# Patient Record
Sex: Male | Born: 1985
Health system: Southern US, Community
[De-identification: ages and names within clinical notes are randomized; demographics above are authoritative.]

## PROBLEM LIST (undated history)

## (undated) DIAGNOSIS — R5383 Other fatigue: Secondary | ICD-10-CM

## (undated) DIAGNOSIS — I499 Cardiac arrhythmia, unspecified: Secondary | ICD-10-CM

## (undated) DIAGNOSIS — IMO0001 Reserved for inherently not codable concepts without codable children: Secondary | ICD-10-CM

## (undated) DIAGNOSIS — H019 Unspecified inflammation of eyelid: Secondary | ICD-10-CM

## (undated) DIAGNOSIS — C119 Malignant neoplasm of nasopharynx, unspecified: Secondary | ICD-10-CM

## (undated) DIAGNOSIS — E039 Hypothyroidism, unspecified: Secondary | ICD-10-CM

## (undated) DIAGNOSIS — R569 Unspecified convulsions: Secondary | ICD-10-CM

## (undated) DIAGNOSIS — C801 Malignant (primary) neoplasm, unspecified: Secondary | ICD-10-CM

## (undated) DIAGNOSIS — C7800 Secondary malignant neoplasm of unspecified lung: Secondary | ICD-10-CM

## (undated) DIAGNOSIS — IMO0002 Reserved for concepts with insufficient information to code with codable children: Secondary | ICD-10-CM

## (undated) DIAGNOSIS — G629 Polyneuropathy, unspecified: Secondary | ICD-10-CM

## (undated) HISTORY — DX: Reserved for concepts with insufficient information to code with codable children: IMO0002

## (undated) HISTORY — PX: RADICAL NECK DISSECTION: SHX2284

## (undated) HISTORY — DX: Polyneuropathy, unspecified: G62.9

## (undated) HISTORY — DX: Other fatigue: R53.83

## (undated) HISTORY — DX: Reserved for inherently not codable concepts without codable children: IMO0001

## (undated) HISTORY — DX: Unspecified inflammation of eyelid: H01.9

## (undated) HISTORY — PX: OTHER SURGICAL HISTORY: SHX169

## (undated) HISTORY — DX: Unspecified convulsions: R56.9

## (undated) HISTORY — DX: Cardiac arrhythmia, unspecified: I49.9

## (undated) HISTORY — DX: Malignant neoplasm of nasopharynx, unspecified: C11.9

## (undated) HISTORY — DX: Secondary malignant neoplasm of unspecified lung: C78.00

---

## 2012-05-03 HISTORY — PX: LUNG REMOVAL, PARTIAL: SHX233

## 2014-05-28 ENCOUNTER — Telehealth: Payer: Self-pay | Admitting: Hematology and Oncology

## 2014-05-28 NOTE — Telephone Encounter (Signed)
EMAILED [facility] EVANS @ [email or web address] .ORG AND GAVE NP APPT FOR 08/28 @ 10:30 W/DR. [place].

## 2014-06-01 ENCOUNTER — Ambulatory Visit (HOSPITAL_BASED_OUTPATIENT_CLINIC_OR_DEPARTMENT_OTHER): Payer: Medicaid Other

## 2014-06-01 ENCOUNTER — Ambulatory Visit: Payer: Medicaid Other

## 2014-06-01 ENCOUNTER — Encounter: Payer: Self-pay | Admitting: Hematology and Oncology

## 2014-06-01 ENCOUNTER — Telehealth: Payer: Self-pay | Admitting: Hematology and Oncology

## 2014-06-01 ENCOUNTER — Ambulatory Visit (HOSPITAL_BASED_OUTPATIENT_CLINIC_OR_DEPARTMENT_OTHER): Payer: Medicaid Other | Admitting: Hematology and Oncology

## 2014-06-01 VITALS — BP 118/66 | HR 70 | Temp 97.6°F | Resp 20 | Ht 66.0 in | Wt 154.0 lb

## 2014-06-01 DIAGNOSIS — G9332 Myalgic encephalomyelitis/chronic fatigue syndrome: Secondary | ICD-10-CM | POA: Insufficient documentation

## 2014-06-01 DIAGNOSIS — C7802 Secondary malignant neoplasm of left lung: Secondary | ICD-10-CM

## 2014-06-01 DIAGNOSIS — C119 Malignant neoplasm of nasopharynx, unspecified: Secondary | ICD-10-CM

## 2014-06-01 DIAGNOSIS — R5383 Other fatigue: Secondary | ICD-10-CM

## 2014-06-01 DIAGNOSIS — R5381 Other malaise: Secondary | ICD-10-CM

## 2014-06-01 DIAGNOSIS — R5382 Chronic fatigue, unspecified: Secondary | ICD-10-CM | POA: Insufficient documentation

## 2014-06-01 DIAGNOSIS — C760 Malignant neoplasm of head, face and neck: Secondary | ICD-10-CM | POA: Insufficient documentation

## 2014-06-01 DIAGNOSIS — C7800 Secondary malignant neoplasm of unspecified lung: Secondary | ICD-10-CM | POA: Insufficient documentation

## 2014-06-01 HISTORY — DX: Other fatigue: R53.83

## 2014-06-01 HISTORY — DX: Secondary malignant neoplasm of unspecified lung: C78.00

## 2014-06-01 HISTORY — DX: Malignant neoplasm of nasopharynx, unspecified: C11.9

## 2014-06-01 LAB — CBC WITH DIFFERENTIAL/PLATELET
BASO%: 0.4 % (ref 0.0–2.0)
BASOS ABS: 0 10*3/uL (ref 0.0–0.1)
EOS ABS: 0.1 10*3/uL (ref 0.0–0.5)
EOS%: 1.1 % (ref 0.0–7.0)
HEMATOCRIT: 43.3 % (ref 38.4–49.9)
HEMOGLOBIN: 14.7 g/dL (ref 13.0–17.1)
LYMPH%: 19 % (ref 14.0–49.0)
MCH: 30.3 pg (ref 27.2–33.4)
MCHC: 33.9 g/dL (ref 32.0–36.0)
MCV: 89.3 fL (ref 79.3–98.0)
MONO#: 0.6 10*3/uL (ref 0.1–0.9)
MONO%: 10.2 % (ref 0.0–14.0)
NEUT%: 69.3 % (ref 39.0–75.0)
NEUTROS ABS: 4 10*3/uL (ref 1.5–6.5)
Platelets: 232 10*3/uL (ref 140–400)
RBC: 4.85 10*6/uL (ref 4.20–5.82)
RDW: 14 % (ref 11.0–14.6)
WBC: 5.7 10*3/uL (ref 4.0–10.3)
lymph#: 1.1 10*3/uL (ref 0.9–3.3)

## 2014-06-01 LAB — TSH CHCC: TSH: 8.526 m[IU]/L — AB (ref 0.320–4.118)

## 2014-06-01 LAB — COMPREHENSIVE METABOLIC PANEL (CC13)
ALK PHOS: 57 U/L (ref 40–150)
ALT: 20 U/L (ref 0–55)
AST: 24 U/L (ref 5–34)
Albumin: 4.5 g/dL (ref 3.5–5.0)
Anion Gap: 9 mEq/L (ref 3–11)
BUN: 18.6 mg/dL (ref 7.0–26.0)
CO2: 27 mEq/L (ref 22–29)
Calcium: 9.7 mg/dL (ref 8.4–10.4)
Chloride: 104 mEq/L (ref 98–109)
Creatinine: 1 mg/dL (ref 0.7–1.3)
GLUCOSE: 93 mg/dL (ref 70–140)
POTASSIUM: 3.7 meq/L (ref 3.5–5.1)
Sodium: 140 mEq/L (ref 136–145)
Total Bilirubin: 0.61 mg/dL (ref 0.20–1.20)
Total Protein: 7.8 g/dL (ref 6.4–8.3)

## 2014-06-01 LAB — T4, FREE: FREE T4: 0.89 ng/dL (ref 0.80–1.80)

## 2014-06-01 NOTE — Telephone Encounter (Signed)
gv and printed appt sched and av sfo rpt for Sept....sent pt to lab

## 2014-06-01 NOTE — Progress Notes (Signed)
Checked in new patient. He just arrived in [place] from [place]. The [employer] referred him and the state has all info. I advised to call billing and get app for asst until medicaid is approved. Today is self. Update all.

## 2014-06-03 NOTE — Assessment & Plan Note (Signed)
Due to prior exposure to radiation treatment, I will order thyroid function tests.

## 2014-06-03 NOTE — Assessment & Plan Note (Signed)
The patient will attempt to obtain more information and outside records. I discussed with him the importance of staging. I would proceed on ordering blood work and PET CT scan to restage him and to give appropriate treatment depending on results. I will see him back next week for further assessment

## 2014-06-03 NOTE — Progress Notes (Signed)
[place] NOTE  Patient Care Team: [name], MD as Consulting Physician (Hematology and Oncology)  CHIEF COMPLAINTS/PURPOSE OF CONSULTATION:  Recurrent nasopharyngeal carcinoma with lung metastasis  HISTORY OF PRESENTING ILLNESS:  Christian Simmons 28 y.o. male is here because of prior history of recurrent nasopharyngeal carcinoma. He gave me copies of his records from [place]. The patient had immigrated from [place] to [place] and finally to [place] recently. Records were poor and the dates of his treatment were approximates only, as outlined below:  Oncology History   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 Pan-endoscopy elsewhere showed cancer from nasopharyngeal space.   01/04/2007 - 02/20/2007 Chemotherapy He received 2 cycles of cisplatin and 5FU followed by concurrent chemo with weekly cisplatin and radiation. He only received 2 doses of chemo due to severe mucositis, nausea and weight loss.   04/05/2007 - 08/04/2007 Chemotherapy He received 4 more courses of cisplatin with 5FU and had complete response   07/05/2009 Procedure Fine-needle aspirate of the right level II lymph nodes come from recurrent metastatic disease. Repeat endoscopy and CT scan show no evidence of disease elsewhere.   07/08/2009 - 12/02/2009 Chemotherapy He was given 6 cycles of carboplatin, 5-FU and docetaxel   12/03/2009 Surgery He has surgery to the residual lymph node on the right neck which showed no evidence of disease.   02/22/2012 Imaging Repeat imaging study showed large recurrent  mass. He was referred elsewhere for further treatment.   05/03/2012 Surgery He underwent left upper lobectomy.   06/03/2013 - 11/06/2013 Chemotherapy He had 6 cycles of chemotherapy when he was found to have recurrence of cancer and had received oxaliplatin and some chemotherapy pill.    According to the patient, the first initial presentation was due to headaches, neck pain, dizziness, epistaxis and left-sided hearing deficit. After receiving numerous treatment, he had peripheral neuropathy. He denies any hearing deficit, difficulties with chewing food, swallowing difficulties, painful swallowing, changes in the quality of his voice or abnormal weight loss. Patient had history of seizure disorder but have no reports seizures since then and had discontinue all treatment. He is married with the wife and son. He had graduated from college recently but is currently not working.  MEDICAL HISTORY:  Past Medical History  Diagnosis Date  . Neuropathy   . 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 Fatigue 06/01/2014    SURGICAL HISTORY: Past Surgical History  Procedure Laterality Date  . Lung removal, partial    . Radical neck dissection    . Nasal biopsy      SOCIAL HISTORY: History   Social History  . Marital Status: Married    Spouse Name: N/A    Number of Children: N/A  . Years of Education: N/A   Occupational History  . Not on file.   Social History Main Topics  . Smoking status: Never Smoker   . Smokeless tobacco: Never Used  . Alcohol Use: No  . Drug Use: No  . Sexual Activity: Not on file  Other Topics Concern  . Not on file   Social History Narrative  . No narrative on file    FAMILY HISTORY: History reviewed. No pertinent family history.  ALLERGIES:  has No Known Allergies.  MEDICATIONS:  No current outpatient prescriptions on file.   No current facility-administered medications for this visit.    REVIEW OF SYSTEMS:    Constitutional: Denies fevers, chills or abnormal night sweats Eyes: Denies blurriness of vision, double vision or watery eyes Ears, nose, mouth, throat, and face: Denies mucositis or sore throat Respiratory: Denies cough, dyspnea or wheezes Cardiovascular: Denies palpitation, chest discomfort or lower extremity swelling Gastrointestinal:  Denies nausea, heartburn or change in bowel habits Skin: Denies abnormal skin rashes Lymphatics: Denies new lymphadenopathy or easy bruising Neurological:Denies numbness, tingling or new weaknesses Behavioral/Psych: Mood is stable, no new changes  All other systems were reviewed with the patient and are negative.  PHYSICAL EXAMINATION: ECOG PERFORMANCE STATUS: 0 - Asymptomatic  Filed Vitals:   06/01/14 1106  BP: 118/66  Pulse: 70  Temp: 97.6 F (36.4 C)  Resp: 20   Filed Weights   06/01/14 1106  Weight: 154 lb (69.854 kg)    GENERAL:alert, no distress and comfortable. He looks thin but not cachectic SKIN: skin color, texture, turgor are normal, no rashes or significant lesions EYES: normal, conjunctiva are pink and non-injected, sclera clear OROPHARYNX:no exudate, no erythema and lips, buccal mucosa, and tongue normal  NECK: His heart from prior surgical scar and radiation fibrosis, thyroid normal size, non-tender, without nodularity LYMPH:  no palpable lymphadenopathy in the cervical, axillary or inguinal LUNGS: clear to auscultation and percussion with normal breathing effort. Well-healed surgical scar HEART: regular rate & rhythm and no murmurs and no lower extremity edema ABDOMEN:abdomen soft, non-tender and normal bowel sounds Musculoskeletal:no cyanosis of digits and no clubbing  PSYCH: alert & oriented x 3 with fluent speech NEURO: no focal motor/sensory deficits  LABORATORY DATA:  I have reviewed the data as listed Lab Results  Component Value Date   WBC 5.7 06/01/2014   HGB 14.7 06/01/2014   HCT 43.3 06/01/2014   MCV 89.3  06/01/2014   PLT 232 06/01/2014   Lab Results  Component Value Date   NA 140 06/01/2014   K 3.7 06/01/2014   CO2 27 06/01/2014   ASSESSMENT & PLAN:  Nasopharyngeal cancer The patient will attempt to obtain more information and outside records. I discussed with him the importance of staging. I would proceed on ordering blood work and PET CT scan to restage him and to give appropriate treatment depending on results. I will see him back next week for further assessment   Fatigue Due to prior exposure to radiation treatment, I will order thyroid function tests.  Metastasis to lung He is not symptomatic. I will observe only.     Orders Placed This Encounter  Procedures  . NM PET Image Initial (PI) Skull Base To Thigh    Standing Status: Future     Number of Occurrences:      Standing Expiration Date: 08/01/2015    Order Specific Question:  Reason for Exam (SYMPTOM  OR DIAGNOSIS REQUIRED)    Answer:  staging recurrent nasopharyngeal ca, s/p lung surgery    Order Specific Question:  Preferred imaging location?    Answer:  [facility]  . CBC with Differential    Standing Status: Future     Number of Occurrences: 1     Standing Expiration Date: 07/06/2015  . Comprehensive  metabolic panel    Standing Status: Future     Number of Occurrences: 1     Standing Expiration Date: 07/06/2015  . TSH    Standing Status: Future     Number of Occurrences: 1     Standing Expiration Date: 07/06/2015  . T4, free    Standing Status: Future     Number of Occurrences: 1     Standing Expiration Date: 07/06/2015    All questions were answered. The patient knows to call the clinic with any problems, questions or concerns. I spent 40 minutes counseling the patient face to face. The total time spent in the appointment was 60 minutes and more than 50% was on counseling.     [facility], [place], MD 06/03/2014 10:19 PM

## 2014-06-03 NOTE — Assessment & Plan Note (Signed)
He is not symptomatic. I will observe only.

## 2014-06-07 ENCOUNTER — Ambulatory Visit (HOSPITAL_COMMUNITY)
Admission: RE | Admit: 2014-06-07 | Discharge: 2014-06-07 | Disposition: A | Discharge status: Home / Self Care | Payer: Medicaid Other | Source: Ambulatory Visit | Attending: Hematology and Oncology | Admitting: Hematology and Oncology

## 2014-06-07 ENCOUNTER — Encounter (HOSPITAL_COMMUNITY): Payer: Self-pay

## 2014-06-07 DIAGNOSIS — R911 Solitary pulmonary nodule: Secondary | ICD-10-CM | POA: Diagnosis present

## 2014-06-07 DIAGNOSIS — C7802 Secondary malignant neoplasm of left lung: Secondary | ICD-10-CM

## 2014-06-07 DIAGNOSIS — C119 Malignant neoplasm of nasopharynx, unspecified: Secondary | ICD-10-CM

## 2014-06-07 LAB — GLUCOSE, CAPILLARY: GLUCOSE-CAPILLARY: 106 mg/dL — AB (ref 70–99)

## 2014-06-07 MED ORDER — FLUDEOXYGLUCOSE F - 18 (FDG) INJECTION: 7.6600 | Freq: Once | INTRAVENOUS | Status: AC | PRN

## 2014-06-08 ENCOUNTER — Ambulatory Visit (HOSPITAL_BASED_OUTPATIENT_CLINIC_OR_DEPARTMENT_OTHER): Payer: Medicaid Other | Admitting: Hematology and Oncology

## 2014-06-08 ENCOUNTER — Telehealth: Payer: Self-pay | Admitting: Hematology and Oncology

## 2014-06-08 ENCOUNTER — Encounter: Payer: Self-pay | Admitting: *Deleted

## 2014-06-08 VITALS — BP 118/60 | HR 70 | Temp 97.8°F | Resp 18 | Ht 66.0 in | Wt 156.6 lb

## 2014-06-08 DIAGNOSIS — C119 Malignant neoplasm of nasopharynx, unspecified: Secondary | ICD-10-CM

## 2014-06-08 DIAGNOSIS — E038 Other specified hypothyroidism: Secondary | ICD-10-CM

## 2014-06-08 DIAGNOSIS — R5381 Other malaise: Secondary | ICD-10-CM

## 2014-06-08 DIAGNOSIS — C7802 Secondary malignant neoplasm of left lung: Secondary | ICD-10-CM

## 2014-06-08 DIAGNOSIS — R5383 Other fatigue: Secondary | ICD-10-CM

## 2014-06-08 NOTE — Telephone Encounter (Signed)
gv and printed pt avs...Marland KitchenMarland KitchenMarland Kitchenper Santiago Glad she will contact pt with appt after she talks to [facility] about the type of appt needed.

## 2014-06-09 DIAGNOSIS — E039 Hypothyroidism, unspecified: Secondary | ICD-10-CM | POA: Insufficient documentation

## 2014-06-09 MED ORDER — LEVOTHYROXINE SODIUM 50 MCG PO TABS: 50.0000 ug | ORAL_TABLET | Freq: Every day | ORAL | Status: DC

## 2014-06-09 NOTE — Assessment & Plan Note (Signed)
Clinically, he has persistent disease, likely local recurrence of lung metastasis at prior surgical site. He also had mild activity in the right neck region. He had received numerous different chemotherapies and I suspect further treatment benefit with systemic treatment would be low. I would prefer to reserve future chemotherapy for wide spread metastatic situation. I recommend consult with a radiation oncologist for palliative radiation therapy.

## 2014-06-09 NOTE — Assessment & Plan Note (Signed)
I will prescribe low dose Synthroid and recheck thyroid function tests in 3 months.

## 2014-06-09 NOTE — Progress Notes (Signed)
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 Pan-endoscopy elsewhere showed cancer from nasopharyngeal space.   01/04/2007 - 02/20/2007 Chemotherapy He received 2 cycles of cisplatin and 5FU followed by concurrent chemo with weekly cisplatin and radiation. He only received 2 doses of chemo due to severe mucositis, nausea and weight loss.   04/05/2007 - 08/04/2007 Chemotherapy He received 4 more courses of cisplatin with 5FU and had complete response   07/05/2009 Procedure Fine-needle aspirate of the right level II lymph nodes come from recurrent metastatic disease. Repeat endoscopy and CT scan show no evidence of disease elsewhere.   07/08/2009 - 12/02/2009 Chemotherapy He was given 6 cycles of carboplatin, 5-FU and docetaxel   12/03/2009 Surgery He has surgery to the residual lymph node on the right neck which showed no evidence of disease.   02/22/2012 Imaging Repeat imaging study showed large recurrent mass. He was referred elsewhere for further treatment.   05/03/2012 Surgery He underwent left upper lobectomy.   04/29/2013 Imaging PEt scan showed lesion on right level II B and lower lung was abnormal   06/03/2013 - 02/02/2014 Chemotherapy He had 6 cycles of chemotherapy  when he was found to have recurrence of cancer and had received oxaliplatin and capecitabine   06/07/2014 Imaging PET CT scan showed persistent disease in the right neck lymph nodes and left lung    INTERVAL HISTORY: Please see below for problem oriented charting. He feels well.  REVIEW OF SYSTEMS:   Constitutional: Denies fevers, chills or abnormal weight loss Eyes: Denies blurriness of vision Ears, nose, mouth, throat, and face: Denies mucositis or sore throat Respiratory: Denies cough, dyspnea or wheezes Cardiovascular: Denies palpitation, chest discomfort or lower extremity swelling Gastrointestinal:  Denies nausea, heartburn or change in bowel habits Skin: Denies abnormal skin rashes Lymphatics: Denies new lymphadenopathy or easy bruising Neurological:Denies numbness, tingling or new weaknesses Behavioral/Psych: Mood is stable, no new changes  All other systems were reviewed with the patient and are negative.  I have reviewed the past medical history, past surgical history, social history and family history with the patient and they are unchanged from previous note.  ALLERGIES:  has No Known Allergies.  MEDICATIONS:  Current Outpatient Prescriptions  Medication Sig Dispense Refill  . levothyroxine (SYNTHROID) 50 MCG tablet Take 1 tablet (50 mcg total) by mouth daily before breakfast.  30 tablet  4   No current facility-administered medications for this visit.    PHYSICAL EXAMINATION: ECOG PERFORMANCE STATUS: 0 - Asymptomatic  Filed Vitals:   06/08/14 1045  BP: 118/60  Pulse: 70  Temp: 97.8 F (36.6 C)  Resp: 18   Filed Weights   06/08/14 1045  Weight: 156 lb 9.6 oz (71.033 kg)    GENERAL:alert, no distress and comfortable  SKIN: skin color, texture, turgor are normal, no rashes or significant lesions EYES: normal, Conjunctiva are pink and non-injected, sclera clear Musculoskeletal:no cyanosis of digits and no clubbing  NEURO: alert & oriented x 3 with fluent  speech, no focal motor/sensory deficits  LABORATORY DATA:  I have reviewed the data as listed    Component Value Date/Time   NA 140 06/01/2014 1148   K 3.7 06/01/2014 1148   CO2 27 06/01/2014 1148   GLUCOSE 93 06/01/2014 1148   BUN 18.6 06/01/2014 1148   CREATININE 1.0 06/01/2014 1148   CALCIUM 9.7 06/01/2014 1148   PROT 7.8 06/01/2014 1148   ALBUMIN 4.5 06/01/2014 1148   AST 24 06/01/2014 1148   ALT 20 06/01/2014 1148   ALKPHOS 57 06/01/2014 1148   BILITOT 0.61 06/01/2014 1148    No results found for this basename: SPEP, UPEP,  kappa and lambda light chains    Lab Results  Component Value Date   WBC 5.7 06/01/2014   NEUTROABS 4.0 06/01/2014   HGB 14.7 06/01/2014   HCT 43.3 06/01/2014   MCV 89.3 06/01/2014   PLT 232 06/01/2014      Chemistry      Component Value Date/Time   NA 140 06/01/2014 1148   K 3.7 06/01/2014 1148   CO2 27 06/01/2014 1148   BUN 18.6 06/01/2014 1148   CREATININE 1.0 06/01/2014 1148      Component Value Date/Time   CALCIUM 9.7 06/01/2014 1148   ALKPHOS 57 06/01/2014 1148   AST 24 06/01/2014 1148   ALT 20 06/01/2014 1148   BILITOT 0.61 06/01/2014 1148       RADIOGRAPHIC STUDIES: Reviewed the PET scan with him I have personally reviewed the radiological images as listed and agreed with the findings in the report.  ASSESSMENT & PLAN:  Nasopharyngeal cancer Clinically, he has persistent disease, likely local recurrence of lung metastasis at prior surgical site. He also had mild activity in the right neck region. He had received numerous different chemotherapies and I suspect further treatment benefit with systemic treatment would be low. I would prefer to reserve future chemotherapy for wide spread metastatic situation. I recommend consult with a radiation oncologist for palliative radiation therapy.  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 TSH was mildly elevated. I recommend treatment with levothyroxine. This  needs to be monitored in the future. Repeat thyroid function tests again in 3 months time. Hopefully, he would have completed his treatment by then.  Hypothyroidism I will prescribe low dose Synthroid and recheck thyroid function tests in 3 months.   Orders Placed This Encounter  Procedures  . TSH    Standing Status: Future     Number of Occurrences:      Standing Expiration Date: 07/14/2015  . CBC with Differential    Standing Status: Future     Number of Occurrences:      Standing Expiration Date: 07/14/2015  . Comprehensive metabolic panel    Standing Status: Future     Number of Occurrences:      Standing Expiration Date: 07/14/2015  . Ambulatory referral to Radiation Oncology    Referral Priority:  Routine    Referral Type:  Consultation    Referral Reason:  Specialty Services Required    Referred to Provider:  Eppie Gibson, MD    Requested Specialty:  Radiation Oncology    Number of Visits Requested:  1   All questions were answered. The patient knows to  call the clinic with any problems, questions or concerns. No barriers to learning was detected. I spent 25 minutes counseling the patient face to face. The total time spent in the appointment was 30 minutes and more than 50% was on counseling and review of test results     [facility], [place], MD 06/09/2014 3:52 PM

## 2014-06-09 NOTE — Assessment & Plan Note (Signed)
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 TSH was mildly elevated. I recommend treatment with levothyroxine. This needs to be monitored in the future. Repeat thyroid function tests again in 3 months time. Hopefully, he would have completed his treatment by then.

## 2014-06-12 ENCOUNTER — Telehealth: Payer: Self-pay | Admitting: *Deleted

## 2014-06-12 NOTE — Telephone Encounter (Signed)
Returned patient's VM left this past Sunday. In response to his inquiry, I explained the purpose of taking Synthroid newly prescribed by Dr. Alvy Bimler on 06/09/14.  He verbalized understanding.  Continuing navigation as L1 patient (new patient).  Gayleen Orem, RN, BSN, [facility] [phone]

## 2014-06-12 NOTE — Progress Notes (Signed)
Head and Neck Cancer Location of Tumor / Histology: Metastatic nasopharyngeal cancer to lymph nodes and left upper lung lobe.   SUMMARY OF ONCOLOGIC HISTORY:  Oncology History    Nasopharyngeal cancer  Primary site: Pharynx - Nasopharynx  Staging method: AJCC 7th Edition  Clinical: Stage IVC (T3, N2, M1) signed by [name], MD on 06/03/2014 10:08 PM  Summary: Stage IVC (T3, N2, M1)  He was diagnosed in [place] and received treatment in [place] and [place]. 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 Pan-endoscopy elsewhere showed cancer from nasopharyngeal space.    01/04/2007 - 02/20/2007  Chemotherapy  He received 2 cycles of cisplatin and 5FU followed by concurrent chemo with weekly cisplatin and radiation. He only received 2 doses of chemo due to severe mucositis, nausea and weight loss.    04/05/2007 - 08/04/2007  Chemotherapy  He received 4 more courses of cisplatin with 5FU and had complete response    07/05/2009  Procedure  Fine-needle aspirate of the right level II lymph nodes come from recurrent metastatic disease. Repeat endoscopy and CT scan show no evidence of disease elsewhere.    07/08/2009 - 12/02/2009  Chemotherapy  He was given 6 cycles of carboplatin, 5-FU and docetaxel    12/03/2009  Surgery  He has surgery to the residual lymph node on the right neck which showed no evidence of disease.    02/22/2012  Imaging  Repeat imaging study showed large recurrent mass. He was referred elsewhere for further treatment.    05/03/2012  Surgery  He underwent left upper lobectomy.    04/29/2013  Imaging  PEt scan showed lesion on right level II B and lower lung was abnormal    06/03/2013 - 02/02/2014  Chemotherapy  He had 6 cycles of chemotherapy when he was found to have recurrence of cancer and had received oxaliplatin and capecitabine    06/07/2014  Imaging  PET CT scan showed  persistent disease in the right neck lymph nodes and left lung       Biopsies of  Neck in 2008  Nutrition Status:  Weight changes:Well maintained. 64 kg   Swallowing status:Good  Plans, if any, for PEG tube:No  Tobacco/Marijuana/Snuff/ETOH use:No history of smoking or drinking.l  Past/Anticipated interventions by otolaryngology, if any:  Past/Anticipated interventions by medical oncology, if HXT:[id number]  6 cycles if carboplatin, 5-fu and docetaxel 07/08/1009-12/02/2009.   Referrals yet, to any of the following?  Social Work? Patient has filed for [employer]  Dentistry?   Swallowing therapy?No  Nutrition? Appetite diminished as adjusting to [employer].  Med/Onc? Seen by Dr.Gorsuch  PEG placement?NO  SAFETY ISSUES:  Prior radiation? [place] first radiation of nasopharyngeal area and second of right neck.  Pacemaker/ICD? No  Possible current pregnancy? N/A  Is the patient on methotrexate? No  Current Complaints / other details:Patient referred for palliative radiation of left upper lobe pulmonary nodule seen on PET from 06/07/14. Patient is married with 1 child.Here today with his sister.He has been in country 2 weeks.Sister has been here 10 years.

## 2014-06-12 NOTE — Telephone Encounter (Signed)
Message copied by Cathlean Cower on Tue Jun 12, 2014  8:50 AM ------      Message from: [facility], [place]      Created: Sat Jun 09, 2014  3:51 PM      Regarding: thyroid medication       I just saw his TSH is high and had prescribed thyroid supplement for him, ready at pharmacy. Please call and let him know ------

## 2014-06-12 NOTE — Telephone Encounter (Signed)
Informed pt of new thyroid medication and lab results.  He verbalized understanding and states he has already picked up the from drug store and taking once daily as directed.

## 2014-06-12 NOTE — Progress Notes (Signed)
Met with patient briefly after his appt with Dr. Alvy Bimler.   Introduced myself as his Sales executive, explained my role as a member of his Care Team, provided contact information, explained that I will be joining him during future appts.  He verbalized understanding.  Initiating navigation as L1 patient (new patient) with this encounter.  Gayleen Orem, RN, BSN, [facility] [phone]

## 2014-06-13 ENCOUNTER — Ambulatory Visit: Payer: Self-pay | Admitting: Radiation Oncology

## 2014-06-13 ENCOUNTER — Ambulatory Visit
Admission: RE | Admit: 2014-06-13 | Discharge: 2014-06-13 | Disposition: A | Discharge status: Home / Self Care | Payer: Medicaid Other | Source: Ambulatory Visit | Attending: Radiation Oncology | Admitting: Radiation Oncology

## 2014-06-13 ENCOUNTER — Encounter: Payer: Self-pay | Admitting: *Deleted

## 2014-06-13 ENCOUNTER — Telehealth: Payer: Self-pay | Admitting: Hematology and Oncology

## 2014-06-13 ENCOUNTER — Encounter: Payer: Self-pay | Admitting: Radiation Oncology

## 2014-06-13 VITALS — BP 124/67 | HR 75 | Temp 97.4°F | Wt 158.6 lb

## 2014-06-13 DIAGNOSIS — Z510 Encounter for antineoplastic radiation therapy: Secondary | ICD-10-CM | POA: Insufficient documentation

## 2014-06-13 DIAGNOSIS — Z923 Personal history of irradiation: Secondary | ICD-10-CM | POA: Insufficient documentation

## 2014-06-13 DIAGNOSIS — Z85819 Personal history of malignant neoplasm of unspecified site of lip, oral cavity, and pharynx: Secondary | ICD-10-CM | POA: Insufficient documentation

## 2014-06-13 DIAGNOSIS — K117 Disturbances of salivary secretion: Secondary | ICD-10-CM | POA: Insufficient documentation

## 2014-06-13 DIAGNOSIS — Z902 Acquired absence of lung [part of]: Secondary | ICD-10-CM | POA: Diagnosis not present

## 2014-06-13 DIAGNOSIS — R0989 Other specified symptoms and signs involving the circulatory and respiratory systems: Secondary | ICD-10-CM | POA: Diagnosis not present

## 2014-06-13 DIAGNOSIS — R222 Localized swelling, mass and lump, trunk: Secondary | ICD-10-CM | POA: Diagnosis not present

## 2014-06-13 DIAGNOSIS — Z85118 Personal history of other malignant neoplasm of bronchus and lung: Secondary | ICD-10-CM | POA: Diagnosis not present

## 2014-06-13 DIAGNOSIS — R0609 Other forms of dyspnea: Secondary | ICD-10-CM | POA: Diagnosis not present

## 2014-06-13 DIAGNOSIS — M542 Cervicalgia: Secondary | ICD-10-CM | POA: Insufficient documentation

## 2014-06-13 DIAGNOSIS — Z9221 Personal history of antineoplastic chemotherapy: Secondary | ICD-10-CM | POA: Diagnosis not present

## 2014-06-13 DIAGNOSIS — C7802 Secondary malignant neoplasm of left lung: Secondary | ICD-10-CM

## 2014-06-13 NOTE — Progress Notes (Signed)
Please see the Nurse Progress Note in the MD Initial Consult Encounter for this patient. 

## 2014-06-13 NOTE — Progress Notes (Signed)
Radiation treatment at [facility] 2008/2009 and [place] 2013.

## 2014-06-13 NOTE — Telephone Encounter (Signed)
s.w. pt and advised on Dec appt....pt ok and aware...Marland Kitchenhes comming by sched to pick up Dec appt

## 2014-06-13 NOTE — Progress Notes (Signed)
Radiation Oncology         [phone]) [phone] ________________________________  Initial outpatient Consultation - Date: 06/13/2014   Name: Christian Simmons MRN: [id number]   DOB: 06-22-1986  REFERRING PHYSICIAN: [name], MD  DIAGNOSIS: Metastatic nasopharyngeal carcinoma of the lung   STAGE: Nasopharyngeal cancer   Primary site: Pharynx - Nasopharynx   Staging method: AJCC 7th Edition   Clinical: Stage IVC (T3, N2, M1) signed by [name], MD on 06/03/2014 10:08 PM   Summary: Stage IVC (T3, N2, M1)  HISTORY OF PRESENT ILLNESS::Christian Simmons is a 28 y.o. male  Who was originally diagnosed with nasopharyngeal cancer in 2008 when he presented with a right neck mass. He was given what sounds like high dose cisplatin and radiation but did not chemo well. He did well and received adjuvant chemo alone. Sometime after that he was diagnosed with a recurrence in the original right neck mass and underwent further chemotherapy followed by surgery and repeat radiation.  He describes both radiation treatments lasting 2 months at first and then 6 weeks after that. The first radiation turned his skin black all over his mouth, face, neck and shoulders.  The second radiation per his report just involved the right and left neck. He was then found to have a left upper lobe mass and underwent left upper lobectomy in 2013 which he tolerated well. A PET scan in 2014 showed recurrent disease in the right neck again and in the left lung. Starting in late 2014 to mid May he was taking capecitabine and oxaliplatin. He had 4 cycles and a PET scan showed residual disease in the neck and lung so he underwent 2 more cycles and then immigrated to the [place]. He was accompanied by his wife today. He has a good understanding of his previous treatment. He has some dyspnea with exercise due to his previous lung resection but no shortness of breath that is worsening, cough or hemoptysis. His weight is stable. His main complaint is neck  pain on the right and he feels he can palpate disease growing back along his SCM.  He was seen by Dr. Alvy Bimler who ordered a PET scan. This showed low level activity in the tonsil and a right level 2 lymph node. He had a mass in the left lung with an SUV of 17. He would like to pursue aggressive treatment. He denies any side effects from his previous treatment. He states he had no hearing loss and any neuropathy he had in his fingers and his toes has gone away.  He lost all of his teeth due to radiation and has implants in place. He has residual xerostomia. He cannot raise his left arm above his shoulder since his neck surgery several years ago He states that the usual pattern is that his neck mass grows, he receives chemo and it shrinks.  I do not have records of any of this treatment which he emailed to Dr. Alvy Bimler and am working totally from his history and her notes.   PREVIOUS RADIATION THERAPY: Yes presumably to the nasopharynx, neck and then retreat to the right neck.   PAST MEDICAL HISTORY:  has a past medical history of Neuropathy; Nasopharyngeal cancer (06/01/2014); Metastasis to lung (06/01/2014); and Fatigue (06/01/2014).    PAST SURGICAL HISTORY: Past Surgical History  Procedure Laterality Date  . Lung removal, partial  05/03/2012    left upper lobectomy  . Radical neck dissection    . Nasal biopsy      FAMILY  HISTORY: No family history on file.  SOCIAL HISTORY:  History  Substance Use Topics  . Smoking status: Never Smoker   . Smokeless tobacco: Never Used  . Alcohol Use: No    ALLERGIES: Review of patient's allergies indicates no known allergies.  MEDICATIONS:  Current Outpatient Prescriptions  Medication Sig Dispense Refill  . levothyroxine (SYNTHROID) 50 MCG tablet Take 1 tablet (50 mcg total) by mouth daily before breakfast.  30 tablet  4   No current facility-administered medications for this encounter.    REVIEW OF SYSTEMS:  A 15 point review of systems is documented  in the electronic medical record. This was obtained by the nursing staff. However, I reviewed this with the patient to discuss relevant findings and make appropriate changes.  Pertinent items are noted in HPI.  PHYSICAL EXAM:  Filed Vitals:   06/13/14 1029  BP: 124/67  Pulse: 75  Temp: 97.4 F (36.3 C)  .158 lb 9.6 oz (71.94 kg). Pleasant male. Appears thin. Alert and oriented. Cooperative with exam. Fibrotic right neck with tenderness along the right SCM. No palpable abnormalities of the right or left neck. 5/5 streghtn bilaterally. Cannot abduct left arm above his shoulder.   LABORATORY DATA:  Lab Results  Component Value Date   WBC 5.7 06/01/2014   HGB 14.7 06/01/2014   HCT 43.3 06/01/2014   MCV 89.3 06/01/2014   PLT 232 06/01/2014   Lab Results  Component Value Date   NA 140 06/01/2014   K 3.7 06/01/2014   CO2 27 06/01/2014   Lab Results  Component Value Date   ALT 20 06/01/2014   AST 24 06/01/2014   ALKPHOS 57 06/01/2014   BILITOT 0.61 06/01/2014     RADIOGRAPHY: Nm Pet Image Initial (pi) Skull Base To Thigh  06/07/2014   CLINICAL DATA:  Initial treatment strategy for pulmonary nodule.  EXAM: NUCLEAR MEDICINE PET SKULL BASE TO THIGH  TECHNIQUE: 7.6 mCi F-18 FDG was injected intravenously. Full-ring PET imaging was performed from the skull base to thigh after the radiotracer. CT data was obtained and used for attenuation correction and anatomic localization.  FASTING BLOOD GLUCOSE:  Value: 106 mg/dl  COMPARISON:  None.  FINDINGS: NECK  Asymmetric increased uptake within the right fossa of Rosenmuller is identified. This has an SUV max equal to 4.1. There is a hypermetabolic right level IIb lymph node. This has an SUV max equal to 3.8.  CHEST  Within the left upper lobe there is a mass associated with the suture line. This measures 1.8 x 1.7 cm and has an SUV max equal to 17.8. No additional hypermetabolic pulmonary nodules or mass is identified. No hypermetabolic mediastinal or hilar lymph  nodes.  ABDOMEN/PELVIS  No abnormal hypermetabolic activity within the liver, pancreas, adrenal glands, or spleen. No hypermetabolic lymph nodes in the abdomen or pelvis.  SKELETON  No focal hypermetabolic activity to suggest skeletal metastasis.  IMPRESSION: 1. Examination is positive for hypermetabolic tumor associated with left upper lobe suture line. 2. Asymmetric increased uptake within the right fossa of Rosenmuller along with mildly hypermetabolic right level 2 B lymph node concerning for residual tumor with regional lymph node metastasis.   Electronically Signed   By: Kerby Moors M.D.   On: 06/07/2014 10:07     IMPRESSION: Recurrent/metastatic nasopharyngeal cancer  PLAN:  I think first we have to establish a diagnosis.  We have no pathology or treatment records and he certainly is not behaving in a way of a typical EBV  associated nasopharynx cancer which can be endemic in [place]. I would like to establish a diagnosis and have sent him to CT surgery for bronchoscopy and biopsy.  He does not have high activity and certainly no primary mass at this time which would yield a diagnosis in the head and neck region.   He is not a candidate for further radiation to the head and neck region as it appears, with his fibrosis and with Dr. Calton Dach clinical summary, that he has had radiation x 2 to the right neck.   If he is found to have a solitary met in the lung, we could consider surgical resection vs. Definitve radiation. As he seems to harbor metastatic disease, he likely has occult disease elsewhere and this treatment is unlikely to be curative.  I discussed this with him. We discussed that currently this lung area is the only site of active disease and I offered him definitive radiation which he accepted.  He understood that radiation is a localized treatment and would not be addressing the areas in his tonsil or neck.  I think we could treat this area with relatively few side effects.    I  will see him back in 2 weeks (after his biopsy and path are back) for simulation for the lung and followup.  I spent 60 minutes  face to face with the patient and more than 50% of that time was spent in counseling and/or coordination of care.   ------------------------------------------------  Thea Silversmith, MD

## 2014-06-13 NOTE — Progress Notes (Signed)
Met with patient and his sister during New Consult appt with Dr. Pablo Ledger.  We discussed purpose of his taking Synthroid, his historical medical tmt.  Provided support during discussion of diagnosis and prognosis, especially for sister.  Patient expressed strong interest in proceeding with RT to lung nodule.  Will continue to provide support as navigator.  Gayleen Orem, RN, BSN, [place] at [place] [phone]

## 2014-06-15 ENCOUNTER — Other Ambulatory Visit: Payer: Self-pay

## 2014-06-15 DIAGNOSIS — C119 Malignant neoplasm of nasopharynx, unspecified: Secondary | ICD-10-CM

## 2014-06-18 ENCOUNTER — Telehealth: Payer: Self-pay

## 2014-06-18 NOTE — Telephone Encounter (Signed)
Received a call from [facility] at [place] office regarding referral appointment.dr.Hendrickson will see patient Thursday 06/21/14 at 3:00 pm at [facility] clincic located at [facility] as this is the only availability.Left voice message for patient to return my call so I may give him this information.

## 2014-06-19 ENCOUNTER — Telehealth: Payer: Self-pay | Admitting: *Deleted

## 2014-06-19 NOTE — Telephone Encounter (Signed)
Called patient for an appt with Dr. Roxan Hockey at the cancer center at [facility] at 1:45. He verbalized understanding of appt time and place.  I asked if he needed an interpretor and he stated no.

## 2014-06-19 NOTE — Telephone Encounter (Signed)
Called left vm message regarding appt time and place.  06/21/14 at 1:45 at [facility]

## 2014-06-21 ENCOUNTER — Encounter: Payer: Self-pay | Admitting: *Deleted

## 2014-06-21 ENCOUNTER — Other Ambulatory Visit: Payer: Self-pay

## 2014-06-21 ENCOUNTER — Institutional Professional Consult (permissible substitution) (INDEPENDENT_AMBULATORY_CARE_PROVIDER_SITE_OTHER): Payer: Medicaid Other | Admitting: Thoracic Surgery (Cardiothoracic Vascular Surgery)

## 2014-06-21 ENCOUNTER — Encounter: Payer: Self-pay | Admitting: Thoracic Surgery (Cardiothoracic Vascular Surgery)

## 2014-06-21 VITALS — BP 111/69 | HR 73 | Temp 98.5°F | Resp 19 | Ht 66.0 in | Wt 158.0 lb

## 2014-06-21 DIAGNOSIS — Z85819 Personal history of malignant neoplasm of unspecified site of lip, oral cavity, and pharynx: Secondary | ICD-10-CM

## 2014-06-21 DIAGNOSIS — H538 Other visual disturbances: Secondary | ICD-10-CM

## 2014-06-21 DIAGNOSIS — R222 Localized swelling, mass and lump, trunk: Secondary | ICD-10-CM

## 2014-06-21 DIAGNOSIS — R918 Other nonspecific abnormal finding of lung field: Secondary | ICD-10-CM

## 2014-06-21 DIAGNOSIS — J984 Other disorders of lung: Secondary | ICD-10-CM

## 2014-06-21 NOTE — Progress Notes (Signed)
[place] Clinical Social Work  Clinical Social Work met with patient/family at [employer] appointment to offer support and assess for psychosocial needs.  Christian Simmons was accompanied by his sister today.  The patient recently moved to the [place] three weeks ago through [employer].  He lives with his spouse and 79 month old daughter.  His sister and her family live nearby.  He shared he has good family support, but is sometimes "bored" by missing all his friends and family in [place].  Mr. Linsley was recently approved for Medicaid through [place] further explained medicaid system as well as hospital/pharmacy systems.  CSW coordinated visit with financial counselor to enroll in [place].   Clinical Social Work briefly discussed Clinical Social Work role and [employer] support programs/services.  Clinical Social Work encouraged patient to call with any additional questions or concerns.   Polo Riley, MSW, LCSW, OSW-C Clinical Social Worker [facility] [phone]

## 2014-06-21 NOTE — Progress Notes (Signed)
PCP is PROVIDER NOT IN SYSTEM Referring Provider is No ref. provider found  Chief Complaint  Patient presents with  . 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 He was diagnosed in 2008 and treated with XRT and a radical neck dissection. He then had a wedge resection of a left upper lobe lung metastasis in Feb 2014.  He recently moved to [facility] and established care with Dr. Alvy Bimler. A PET/CT was done which showed a left upper lobe mass associated with the staple line. There were areas of hypermetabolic uptake in the nasopharynx and right posterior cervical nodes.  He has been having some nasal congestion primarily on the right side. He also complains of pain right posterior neck. He does not have cough, hemoptysis, or SOB. Weight stable.   Past Medical History  Diagnosis Date  . Neuropathy   . Fatigue 06/01/2014  . 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 Lung removal, partial  05/03/2012    left upper lobectomy  . Radical neck dissection    . Nasal biopsy      No family history on file.  Social History History  Substance Use Topics  . Smoking status: Never Smoker   . Smokeless tobacco: Never Used  . Alcohol Use: No    Current Outpatient Prescriptions  Medication Sig Dispense Refill  . levothyroxine (SYNTHROID) 50 MCG tablet Take 1 tablet (50 mcg total) by mouth daily before breakfast.  30 tablet  4   No current facility-administered medications for this visit.    No Known Allergies  Review of Systems  Constitutional: Positive for fatigue. Negative for fever, chills and unexpected weight change.  HENT: Positive for hearing loss, rhinorrhea and sinus pressure (right side). Negative for congestion (right side).   Eyes: Positive for visual disturbance (blurry vision x 2 months, + photophobia).  Gastrointestinal:  Positive for constipation.       Heart burn    BP 111/69  Pulse 73  Temp(Src) 98.5 F (36.9 C)  Resp 19  Ht 5\' 6"  (1.676 m)  Wt 158 lb (71.668 kg)  BMI 25.51 kg/m2  SpO2 100% Physical Exam  Vitals reviewed. Constitutional: He is oriented to person, place, and time. He appears well-developed and well-nourished. No distress.  HENT:  Head: Normocephalic and atraumatic.  Cardiovascular: Normal rate and regular rhythm.   Murmur (2/6 systolic) heard. Pulmonary/Chest: Effort normal and breath sounds normal.  Well healed scars  Abdominal: Soft. There is no tenderness.  Musculoskeletal: He exhibits no edema.  Lymphadenopathy:    He has cervical adenopathy (questionable right posterior neck along prior incision, mildly tender).  Neurological: He is alert and oriented to person, place, and time. No cranial nerve deficit.     Diagnostic Tests: NUCLEAR MEDICINE PET SKULL BASE TO THIGH  TECHNIQUE:  7.6 mCi F-18 FDG was injected intravenously. Full-ring PET imaging  was performed from the skull base to thigh after the radiotracer. CT  data was obtained and used for attenuation correction and anatomic  localization.  FASTING BLOOD GLUCOSE: Value: 106 mg/dl  COMPARISON: None.  FINDINGS:  NECK  Asymmetric increased uptake within the right fossa of Rosenmuller is  identified. This has an SUV max equal to 4.1. There is a  hypermetabolic right level IIb lymph node. This has an SUV max equal  to 3.8.  CHEST  Within the left upper lobe there is a mass associated with the  suture line. This measures 1.8 x 1.7 cm and has an SUV max equal to  17.8. No additional hypermetabolic pulmonary nodules or mass is  identified. No hypermetabolic mediastinal or hilar lymph nodes.  ABDOMEN/PELVIS  No abnormal hypermetabolic activity within the liver, pancreas,  adrenal glands, or spleen. No hypermetabolic lymph nodes in the  abdomen or pelvis.  SKELETON  No focal hypermetabolic activity to suggest  skeletal metastasis.  IMPRESSION:  1. Examination is positive for hypermetabolic tumor associated with  left upper lobe suture line.  2. Asymmetric increased uptake within the right fossa of Rosenmuller  along with mildly hypermetabolic right level 2 B lymph node  concerning for residual tumor with regional lymph node metastasis.  Electronically Signed  By: Kerby Moors M.D.  On: 06/07/2014 10:07   Impression: 28 year old with a history of stage 4 nasopharyngeal cancer with a lung metastasis. He now has a lung mass associated with the staple line which in all likelihood is a recurrence. He likely has recurrent disease in the nasopharynx and right neck as well.  He needs a biopsy to confirm the diagnosis and assist with planning treatment.  I have recommended to him that we proceed with bronchoscopy for biopsy of the left upper lobe mass. I discussed the procedure with him. He wishes to have GA as his previous bronch was uncomfortable. He understands the risks include those associated with GA, as well bleeding, pneumothorax and failure to make a diagnosis.  He agrees to proceed  I think we should get a MR of the brain as he has been having unusual blurred vision and photophobia over the past month or two.  Plan: MR Brain  Bronchoscopy on Friday 9/25

## 2014-06-21 NOTE — [facility] Oncology Navigator Note (Unsigned)
Spoke with patient at thoracic clinic today.  He was accompanied by his sister.  I reviewed TCTS history, medication, and vitals.

## 2014-06-22 ENCOUNTER — Encounter (HOSPITAL_COMMUNITY): Payer: Self-pay | Admitting: Pharmacy Technician

## 2014-06-26 ENCOUNTER — Telehealth: Payer: Self-pay | Admitting: *Deleted

## 2014-06-26 ENCOUNTER — Ambulatory Visit
Admission: RE | Admit: 2014-06-26 | Discharge: 2014-06-26 | Disposition: A | Discharge status: Home / Self Care | Payer: Medicaid Other | Source: Ambulatory Visit | Attending: Thoracic Surgery (Cardiothoracic Vascular Surgery) | Admitting: Thoracic Surgery (Cardiothoracic Vascular Surgery)

## 2014-06-26 ENCOUNTER — Other Ambulatory Visit: Payer: Self-pay | Admitting: *Deleted

## 2014-06-26 DIAGNOSIS — Z85819 Personal history of malignant neoplasm of unspecified site of lip, oral cavity, and pharynx: Secondary | ICD-10-CM

## 2014-06-26 DIAGNOSIS — R918 Other nonspecific abnormal finding of lung field: Secondary | ICD-10-CM

## 2014-06-26 DIAGNOSIS — C3410 Malignant neoplasm of upper lobe, unspecified bronchus or lung: Secondary | ICD-10-CM

## 2014-06-26 DIAGNOSIS — H538 Other visual disturbances: Secondary | ICD-10-CM

## 2014-06-26 MED ORDER — GADOBENATE DIMEGLUMINE 529 MG/ML IV SOLN: 13.0000 mL | Freq: Once | INTRAVENOUS | Status: AC | PRN

## 2014-06-26 NOTE — Telephone Encounter (Signed)
Patient called to report that he is "feeling pain in my neck".  He expressed desire to proceed with chemotherapy, "I've always responded well to chemotherapy".  He understands the risks that Drs. Alvy Bimler and Pablo Ledger discussed with him but nonetheless wants to proceed.  I indicated I would notify them of his request.  Gayleen Orem, RN, BSN, [place] at [place] [phone]

## 2014-06-27 ENCOUNTER — Ambulatory Visit: Payer: Medicaid Other

## 2014-06-27 ENCOUNTER — Inpatient Hospital Stay (HOSPITAL_COMMUNITY)
Admission: RE | Admit: 2014-06-27 | Discharge: 2014-06-27 | Disposition: A | Discharge status: Home / Self Care | Payer: Medicaid Other | Source: Ambulatory Visit

## 2014-06-27 ENCOUNTER — Ambulatory Visit: Payer: Medicaid Other | Admitting: Radiation Oncology

## 2014-06-27 NOTE — Pre-Procedure Instructions (Signed)
Christian Simmons  06/27/2014   Your procedure is scheduled on: Friday, June 29, 2014  Report to [facility] Admitting at 5:30 AM.  Call this number if you have problems the morning of surgery: [phone]   Remember:   Do not eat food or drink liquids after midnight Thursday, Sept. 24, 2015   Take these medicines the morning of surgery with A SIP OF WATER: levothyroxine (SYNTHROID)  Stop taking Aspirin, vitamins, and herbal medications. Do not take any NSAIDs ie: Ibuprofen, Advil, Naproxen or any medication containing Aspirin.  Do not wear jewelry, make-up or nail polish.  Do not wear lotions, powders, or perfumes. You may not wear deodorant.  Do not shave 48 hours prior to surgery. Men may shave face and neck.  Do not bring valuables to the hospital.  [facility] is not responsible for any belongings or valuables.               Contacts, dentures or bridgework may not be worn into surgery.  Leave suitcase in the car. After surgery it may be brought to your room.  For patients admitted to the hospital, discharge time is determined by your treatment team.               Patients discharged the day of surgery will not be allowed to drive home.  Name and phone number of your driver:   Special Instructions:  Special Instructions:Special Instructions: [facility] - Preparing for Surgery  Before surgery, you can play an important role.  Because skin is not sterile, your skin needs to be as free of germs as possible.  You can reduce the number of germs on you skin by washing with CHG (chlorahexidine gluconate) soap before surgery.  CHG is an antiseptic cleaner which kills germs and bonds with the skin to continue killing germs even after washing.  Please DO NOT use if you have an allergy to CHG or antibacterial soaps.  If your skin becomes reddened/irritated stop using the CHG and inform your nurse when you arrive at Short Stay.  Do not shave (including legs and underarms) for  at least 48 hours prior to the first CHG shower.  You may shave your face.  Please follow these instructions carefully:   1.  Shower with CHG Soap the night before surgery and the morning of Surgery.  2.  If you choose to wash your hair, wash your hair first as usual with your normal shampoo.  3.  After you shampoo, rinse your hair and body thoroughly to remove the Shampoo.  4.  Use CHG as you would any other liquid soap.  You can apply chg directly  to the skin and wash gently with scrungie or a clean washcloth.  5.  Apply the CHG Soap to your body ONLY FROM THE NECK DOWN.  Do not use on open wounds or open sores.  Avoid contact with your eyes, ears, mouth and genitals (private parts).  Wash genitals (private parts) with your normal soap.  6.  Wash thoroughly, paying special attention to the area where your surgery will be performed.  7.  Thoroughly rinse your body with warm water from the neck down.  8.  DO NOT shower/wash with your normal soap after using and rinsing off the CHG Soap.  9.  Pat yourself dry with a clean towel.            10.  Wear clean pajamas.  11.  Place clean sheets on your bed the night of your first shower and do not sleep with pets.  Day of Surgery  Do not apply any lotions/deodorants the morning of surgery.  Please wear clean clothes to the hospital/surgery center.   Please read over the following fact sheets that you were given: Pain Booklet, Coughing and Deep Breathing and Surgical Site Infection Prevention

## 2014-06-28 ENCOUNTER — Encounter (HOSPITAL_COMMUNITY): Payer: Self-pay | Admitting: *Deleted

## 2014-06-28 ENCOUNTER — Ambulatory Visit (HOSPITAL_COMMUNITY)
Admission: RE | Admit: 2014-06-28 | Discharge: 2014-06-28 | Disposition: A | Discharge status: Home / Self Care | Payer: Medicaid Other | Source: Ambulatory Visit | Attending: Thoracic Surgery (Cardiothoracic Vascular Surgery) | Admitting: Thoracic Surgery (Cardiothoracic Vascular Surgery)

## 2014-06-28 DIAGNOSIS — Z923 Personal history of irradiation: Secondary | ICD-10-CM | POA: Diagnosis not present

## 2014-06-28 DIAGNOSIS — Z01818 Encounter for other preprocedural examination: Secondary | ICD-10-CM | POA: Diagnosis not present

## 2014-06-28 DIAGNOSIS — R222 Localized swelling, mass and lump, trunk: Secondary | ICD-10-CM | POA: Diagnosis present

## 2014-06-28 DIAGNOSIS — C7800 Secondary malignant neoplasm of unspecified lung: Secondary | ICD-10-CM | POA: Diagnosis not present

## 2014-06-28 DIAGNOSIS — Z85819 Personal history of malignant neoplasm of unspecified site of lip, oral cavity, and pharynx: Secondary | ICD-10-CM | POA: Diagnosis not present

## 2014-06-29 ENCOUNTER — Ambulatory Visit (HOSPITAL_COMMUNITY): Payer: Medicaid Other

## 2014-06-29 ENCOUNTER — Encounter (HOSPITAL_COMMUNITY): Payer: Medicaid Other | Admitting: Anesthesiology

## 2014-06-29 ENCOUNTER — Ambulatory Visit (HOSPITAL_COMMUNITY): Payer: Medicaid Other | Admitting: Anesthesiology

## 2014-06-29 ENCOUNTER — Encounter (HOSPITAL_COMMUNITY)
Admission: RE | Disposition: A | Discharge status: Home / Self Care | Payer: Self-pay | Source: Ambulatory Visit | Attending: Thoracic Surgery (Cardiothoracic Vascular Surgery)

## 2014-06-29 ENCOUNTER — Encounter (HOSPITAL_COMMUNITY): Payer: Self-pay | Admitting: *Deleted

## 2014-06-29 ENCOUNTER — Ambulatory Visit (HOSPITAL_COMMUNITY)
Admission: RE | Admit: 2014-06-29 | Discharge: 2014-06-29 | Disposition: A | Discharge status: Home / Self Care | Payer: Medicaid Other | Source: Ambulatory Visit | Attending: Thoracic Surgery (Cardiothoracic Vascular Surgery) | Admitting: Thoracic Surgery (Cardiothoracic Vascular Surgery)

## 2014-06-29 DIAGNOSIS — C7800 Secondary malignant neoplasm of unspecified lung: Secondary | ICD-10-CM | POA: Insufficient documentation

## 2014-06-29 DIAGNOSIS — Z923 Personal history of irradiation: Secondary | ICD-10-CM | POA: Diagnosis not present

## 2014-06-29 DIAGNOSIS — Z85819 Personal history of malignant neoplasm of unspecified site of lip, oral cavity, and pharynx: Secondary | ICD-10-CM | POA: Insufficient documentation

## 2014-06-29 DIAGNOSIS — Z01818 Encounter for other preprocedural examination: Secondary | ICD-10-CM | POA: Insufficient documentation

## 2014-06-29 DIAGNOSIS — R222 Localized swelling, mass and lump, trunk: Secondary | ICD-10-CM | POA: Diagnosis not present

## 2014-06-29 DIAGNOSIS — J984 Other disorders of lung: Secondary | ICD-10-CM

## 2014-06-29 HISTORY — DX: Hypothyroidism, unspecified: E03.9

## 2014-06-29 HISTORY — PX: VIDEO BRONCHOSCOPY: SHX5072

## 2014-06-29 LAB — CBC
HEMATOCRIT: 40.8 % (ref 39.0–52.0)
HEMOGLOBIN: 14 g/dL (ref 13.0–17.0)
MCH: 30.2 pg (ref 26.0–34.0)
MCHC: 34.3 g/dL (ref 30.0–36.0)
MCV: 88.1 fL (ref 78.0–100.0)
Platelets: 186 10*3/uL (ref 150–400)
RBC: 4.63 MIL/uL (ref 4.22–5.81)
RDW: 14 % (ref 11.5–15.5)
WBC: 5 10*3/uL (ref 4.0–10.5)

## 2014-06-29 LAB — COMPREHENSIVE METABOLIC PANEL
ALBUMIN: 4.1 g/dL (ref 3.5–5.2)
ALK PHOS: 47 U/L (ref 39–117)
ALT: 24 U/L (ref 0–53)
ANION GAP: 9 (ref 5–15)
AST: 29 U/L (ref 0–37)
BILIRUBIN TOTAL: 0.3 mg/dL (ref 0.3–1.2)
BUN: 19 mg/dL (ref 6–23)
CHLORIDE: 104 meq/L (ref 96–112)
CO2: 28 mEq/L (ref 19–32)
Calcium: 9.4 mg/dL (ref 8.4–10.5)
Creatinine, Ser: 0.97 mg/dL (ref 0.50–1.35)
GFR calc non Af Amer: >90 mL/min (ref >90)
Glucose, Bld: 95 mg/dL (ref 70–99)
Potassium: 4.4 mEq/L (ref 3.7–5.3)
Sodium: 141 mEq/L (ref 137–147)
Total Protein: 7.1 g/dL (ref 6.0–8.3)

## 2014-06-29 LAB — PROTIME-INR
INR: 0.92 (ref 0.00–1.49)
Prothrombin Time: 12.4 seconds (ref 11.6–15.2)

## 2014-06-29 LAB — APTT: aPTT: 30 seconds (ref 24–37)

## 2014-06-29 SURGERY — BRONCHOSCOPY, VIDEO-ASSISTED
Anesthesia: General | Site: Bronchus

## 2014-06-29 MED ORDER — PROPOFOL 10 MG/ML IV BOLUS
INTRAVENOUS | Status: AC
  Filled 2014-06-29: qty 20

## 2014-06-29 MED ORDER — EPINEPHRINE HCL 1 MG/ML IJ SOLN
INTRAMUSCULAR | Status: AC
  Filled 2014-06-29: qty 1

## 2014-06-29 MED ORDER — EPINEPHRINE HCL 1 MG/ML IJ SOLN
INTRAMUSCULAR | Status: DC | PRN
  Administered 2014-06-29: 1 mg via ENDOTRACHEOPULMONARY

## 2014-06-29 MED ORDER — LIDOCAINE HCL (CARDIAC) 20 MG/ML IV SOLN
INTRAVENOUS | Status: DC | PRN
  Administered 2014-06-29: 50 mg via INTRAVENOUS

## 2014-06-29 MED ORDER — LIDOCAINE HCL (CARDIAC) 20 MG/ML IV SOLN
INTRAVENOUS | Status: AC
  Filled 2014-06-29: qty 5

## 2014-06-29 MED ORDER — LACTATED RINGERS IV SOLN
INTRAVENOUS | Status: DC | PRN
  Administered 2014-06-29: 07:00:00 via INTRAVENOUS

## 2014-06-29 MED ORDER — ROCURONIUM BROMIDE 50 MG/5ML IV SOLN
INTRAVENOUS | Status: AC
  Filled 2014-06-29: qty 1

## 2014-06-29 MED ORDER — ONDANSETRON HCL 4 MG/2ML IJ SOLN
INTRAMUSCULAR | Status: DC | PRN
  Administered 2014-06-29: 4 mg via INTRAVENOUS

## 2014-06-29 MED ORDER — NEOSTIGMINE METHYLSULFATE 10 MG/10ML IV SOLN
INTRAVENOUS | Status: AC
  Filled 2014-06-29: qty 1

## 2014-06-29 MED ORDER — SUCCINYLCHOLINE CHLORIDE 20 MG/ML IJ SOLN
INTRAMUSCULAR | Status: AC
  Filled 2014-06-29: qty 1

## 2014-06-29 MED ORDER — NEOSTIGMINE METHYLSULFATE 10 MG/10ML IV SOLN
INTRAVENOUS | Status: DC | PRN
  Administered 2014-06-29: 3 mg via INTRAVENOUS

## 2014-06-29 MED ORDER — ONDANSETRON HCL 4 MG/2ML IJ SOLN: 4.0000 mg | Freq: Once | INTRAMUSCULAR | Status: DC | PRN

## 2014-06-29 MED ORDER — LIDOCAINE HCL 4 % MT SOLN
OROMUCOSAL | Status: DC | PRN
  Administered 2014-06-29: 4 mL via TOPICAL

## 2014-06-29 MED ORDER — GLYCOPYRROLATE 0.2 MG/ML IJ SOLN
INTRAMUSCULAR | Status: DC | PRN
  Administered 2014-06-29: 0.4 mg via INTRAVENOUS

## 2014-06-29 MED ORDER — FENTANYL CITRATE 0.05 MG/ML IJ SOLN
INTRAMUSCULAR | Status: AC
  Filled 2014-06-29: qty 5

## 2014-06-29 MED ORDER — FENTANYL CITRATE 0.05 MG/ML IJ SOLN
INTRAMUSCULAR | Status: DC | PRN
  Administered 2014-06-29: 100 ug via INTRAVENOUS

## 2014-06-29 MED ORDER — GLYCOPYRROLATE 0.2 MG/ML IJ SOLN
INTRAMUSCULAR | Status: AC
  Filled 2014-06-29: qty 2

## 2014-06-29 MED ORDER — HYDROMORPHONE HCL 1 MG/ML IJ SOLN: 0.2500 mg | INTRAMUSCULAR | Status: DC | PRN

## 2014-06-29 MED ORDER — PROPOFOL 10 MG/ML IV BOLUS
INTRAVENOUS | Status: DC | PRN
  Administered 2014-06-29: 170 mg via INTRAVENOUS

## 2014-06-29 MED ORDER — MIDAZOLAM HCL 2 MG/2ML IJ SOLN
INTRAMUSCULAR | Status: AC
  Filled 2014-06-29: qty 2

## 2014-06-29 MED ORDER — EPINEPHRINE HCL 1 MG/ML IJ SOLN
INTRAMUSCULAR | Status: AC
Start: 2014-06-29 — End: 2014-06-29
  Filled 2014-06-29: qty 1

## 2014-06-29 MED ORDER — MIDAZOLAM HCL 5 MG/5ML IJ SOLN
INTRAMUSCULAR | Status: DC | PRN
  Administered 2014-06-29: 2 mg via INTRAVENOUS

## 2014-06-29 MED ORDER — ROCURONIUM BROMIDE 100 MG/10ML IV SOLN
INTRAVENOUS | Status: DC | PRN
  Administered 2014-06-29: 35 mg via INTRAVENOUS
  Administered 2014-06-29: 5 mg via INTRAVENOUS

## 2014-06-29 SURGICAL SUPPLY — 27 items
BRUSH CYTOL CELLEBRITY 1.5X140 (MISCELLANEOUS) ×3 IMPLANT
CANISTER SUCTION 2500CC (MISCELLANEOUS) ×3 IMPLANT
CONT SPEC 4OZ CLIKSEAL STRL BL (MISCELLANEOUS) ×3 IMPLANT
COTTONBALL LRG STERILE PKG (GAUZE/BANDAGES/DRESSINGS) IMPLANT
COVER TABLE BACK 60X90 (DRAPES) ×3 IMPLANT
FILTER STRAW FLUID ASPIR (MISCELLANEOUS) ×3 IMPLANT
FORCEPS BIOP RJ4 1.8 (CUTTING FORCEPS) ×3 IMPLANT
GAUZE SPONGE 4X4 12PLY STRL (GAUZE/BANDAGES/DRESSINGS) ×3 IMPLANT
GLOVE BIO SURGEON STRL SZ 6.5 (GLOVE) ×2 IMPLANT
GLOVE BIO SURGEONS STRL SZ 6.5 (GLOVE) ×1
GLOVE EUDERMIC 7 POWDERFREE (GLOVE) IMPLANT
GLOVE SURG SIGNA 7.5 PF LTX (GLOVE) ×3 IMPLANT
KIT ROOM TURNOVER OR (KITS) ×3 IMPLANT
NEEDLE 22X1 1/2 (OR ONLY) (NEEDLE) IMPLANT
NEEDLE BIOPSY TRANSBRONCH 21G (NEEDLE) IMPLANT
NEEDLE SUPERTRX PREMARK BIOPSY (NEEDLE) ×3 IMPLANT
NS IRRIG 1000ML POUR BTL (IV SOLUTION) ×3 IMPLANT
PAD ARMBOARD 7.5X6 YLW CONV (MISCELLANEOUS) ×6 IMPLANT
SOLUTION ANTI FOG 6CC (MISCELLANEOUS) ×3 IMPLANT
SYR 20ML ECCENTRIC (SYRINGE) ×3 IMPLANT
SYR 5ML LL (SYRINGE) ×3 IMPLANT
SYR 5ML LUER SLIP (SYRINGE) ×3 IMPLANT
SYR CONTROL 10ML LL (SYRINGE) IMPLANT
TOWEL OR 17X24 6PK STRL BLUE (TOWEL DISPOSABLE) ×3 IMPLANT
TRAP SPECIMEN MUCOUS 40CC (MISCELLANEOUS) ×6 IMPLANT
TUBE CONNECTING 12'X1/4 (SUCTIONS) ×1
TUBE CONNECTING 12X1/4 (SUCTIONS) ×2 IMPLANT

## 2014-06-29 NOTE — Transfer of Care (Signed)
Immediate Anesthesia Transfer of Care Note  Patient: Christian Simmons  Procedure(s) Performed: Procedure(s): VIDEO BRONCHOSCOPY  (N/A)  Patient Location: PACU  Anesthesia Type:General  Level of Consciousness: awake, alert  and oriented  Airway & Oxygen Therapy: Patient Spontanous Breathing and Patient connected to nasal cannula oxygen  Post-op Assessment: Report given to PACU RN and Post -op Vital signs reviewed and stable  Post vital signs: Reviewed and stable  Complications: No apparent anesthesia complications

## 2014-06-29 NOTE — H&P (View-Only) (Signed)
PCP is PROVIDER NOT IN SYSTEM Referring Provider is No ref. provider found  Chief Complaint  Patient presents with  . 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 He was diagnosed in 2008 and treated with XRT and a radical neck dissection. He then had a wedge resection of a left upper lobe lung metastasis in Feb 2014.  He recently moved to [facility] and established care with Dr. Alvy Bimler. A PET/CT was done which showed a left upper lobe mass associated with the staple line. There were areas of hypermetabolic uptake in the nasopharynx and right posterior cervical nodes.  He has been having some nasal congestion primarily on the right side. He also complains of pain right posterior neck. He does not have cough, hemoptysis, or SOB. Weight stable.   Past Medical History  Diagnosis Date  . Neuropathy   . Fatigue 06/01/2014  . 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 Lung removal, partial  05/03/2012    left upper lobectomy  . Radical neck dissection    . Nasal biopsy      No family history on file.  Social History History  Substance Use Topics  . Smoking status: Never Smoker   . Smokeless tobacco: Never Used  . Alcohol Use: No    Current Outpatient Prescriptions  Medication Sig Dispense Refill  . levothyroxine (SYNTHROID) 50 MCG tablet Take 1 tablet (50 mcg total) by mouth daily before breakfast.  30 tablet  4   No current facility-administered medications for this visit.    No Known Allergies  Review of Systems  Constitutional: Positive for fatigue. Negative for fever, chills and unexpected weight change.  HENT: Positive for hearing loss, rhinorrhea and sinus pressure (right side). Negative for congestion (right side).   Eyes: Positive for visual disturbance (blurry vision x 2 months, + photophobia).  Gastrointestinal:  Positive for constipation.       Heart burn    BP 111/69  Pulse 73  Temp(Src) 98.5 F (36.9 C)  Resp 19  Ht 5\' 6"  (1.676 m)  Wt 158 lb (71.668 kg)  BMI 25.51 kg/m2  SpO2 100% Physical Exam  Vitals reviewed. Constitutional: He is oriented to person, place, and time. He appears well-developed and well-nourished. No distress.  HENT:  Head: Normocephalic and atraumatic.  Cardiovascular: Normal rate and regular rhythm.   Murmur (2/6 systolic) heard. Pulmonary/Chest: Effort normal and breath sounds normal.  Well healed scars  Abdominal: Soft. There is no tenderness.  Musculoskeletal: He exhibits no edema.  Lymphadenopathy:    He has cervical adenopathy (questionable right posterior neck along prior incision, mildly tender).  Neurological: He is alert and oriented to person, place, and time. No cranial nerve deficit.     Diagnostic Tests: NUCLEAR MEDICINE PET SKULL BASE TO THIGH  TECHNIQUE:  7.6 mCi F-18 FDG was injected intravenously. Full-ring PET imaging  was performed from the skull base to thigh after the radiotracer. CT  data was obtained and used for attenuation correction and anatomic  localization.  FASTING BLOOD GLUCOSE: Value: 106 mg/dl  COMPARISON: None.  FINDINGS:  NECK  Asymmetric increased uptake within the right fossa of Rosenmuller is  identified. This has an SUV max equal to 4.1. There is a  hypermetabolic right level IIb lymph node. This has an SUV max equal  to 3.8.  CHEST  Within the left upper lobe there is a mass associated with the  suture line. This measures 1.8 x 1.7 cm and has an SUV max equal to  17.8. No additional hypermetabolic pulmonary nodules or mass is  identified. No hypermetabolic mediastinal or hilar lymph nodes.  ABDOMEN/PELVIS  No abnormal hypermetabolic activity within the liver, pancreas,  adrenal glands, or spleen. No hypermetabolic lymph nodes in the  abdomen or pelvis.  SKELETON  No focal hypermetabolic activity to suggest  skeletal metastasis.  IMPRESSION:  1. Examination is positive for hypermetabolic tumor associated with  left upper lobe suture line.  2. Asymmetric increased uptake within the right fossa of Rosenmuller  along with mildly hypermetabolic right level 2 B lymph node  concerning for residual tumor with regional lymph node metastasis.  Electronically Signed  By: Kerby Moors M.D.  On: 06/07/2014 10:07   Impression: 28 year old with a history of stage 4 nasopharyngeal cancer with a lung metastasis. He now has a lung mass associated with the staple line which in all likelihood is a recurrence. He likely has recurrent disease in the nasopharynx and right neck as well.  He needs a biopsy to confirm the diagnosis and assist with planning treatment.  I have recommended to him that we proceed with bronchoscopy for biopsy of the left upper lobe mass. I discussed the procedure with him. He wishes to have GA as his previous bronch was uncomfortable. He understands the risks include those associated with GA, as well bleeding, pneumothorax and failure to make a diagnosis.  He agrees to proceed  I think we should get a MR of the brain as he has been having unusual blurred vision and photophobia over the past month or two.  Plan: MR Brain  Bronchoscopy on Friday 9/25

## 2014-06-29 NOTE — Interval H&P Note (Signed)
History and Physical Interval Note:  06/29/2014 7:16 AM  Christian Simmons  has presented today for surgery, with the diagnosis of Left upper lobe mass  The various methods of treatment have been discussed with the patient and family. After consideration of risks, benefits and other options for treatment, the patient has consented to  Procedure(s): VIDEO BRONCHOSCOPY, Bronchoscopy (N/A) as a surgical intervention .  The patient's history has been reviewed, patient examined, no change in status, stable for surgery.  I have reviewed the patient's chart and labs.  Questions were answered to the patient's satisfaction.     Kamerin Axford C

## 2014-06-29 NOTE — Brief Op Note (Signed)
06/29/2014  9:24 AM  PATIENT:  Christian Simmons  28 y.o. male  PRE-OPERATIVE DIAGNOSIS:   Left upper lobe Lung mass  POST-OPERATIVE DIAGNOSIS:   Carcinoma  PROCEDURE:  Procedure(s): VIDEO BRONCHOSCOPY  (N/A)  SURGEON:  Surgeon(s) and Role:    * Melrose Nakayama, MD - Primary  ANESTHESIA:   general  EBL:  Total I/O In: 900 [I.V.:900] Out: -   BLOOD ADMINISTERED:none  DRAINS: none   LOCAL MEDICATIONS USED:  NONE  SPECIMEN:  Source of Specimen:  left upper lobe mass  DISPOSITION OF SPECIMEN:  PATHOLOGY   PLAN OF CARE: Discharge to home after PACU  PATIENT DISPOSITION:  PACU - hemodynamically stable.   Delay start of Pharmacological VTE agent (>24hrs) due to surgical blood loss or risk of bleeding: not applicable  BRUSHINGS + CARCINOMA

## 2014-06-29 NOTE — Anesthesia Preprocedure Evaluation (Signed)
Anesthesia Evaluation  Patient identified by MRN, date of birth, ID band Patient awake    Reviewed: Allergy & Precautions, H&P , NPO status , Patient's Chart, lab work & pertinent test results  Airway Mallampati: II TM Distance: >3 FB Neck ROM: Full    Dental  (+) Teeth Intact, Dental Advisory Given   Pulmonary  breath sounds clear to auscultation        Cardiovascular Rhythm:Regular Rate:Normal     Neuro/Psych    GI/Hepatic   Endo/Other    Renal/GU      Musculoskeletal   Abdominal   Peds  Hematology   Anesthesia Other Findings   Reproductive/Obstetrics                           Anesthesia Physical Anesthesia Plan  ASA: III  Anesthesia Plan: General   Post-op Pain Management:    Induction: Intravenous  Airway Management Planned: Oral ETT  Additional Equipment:   Intra-op Plan:   Post-operative Plan: Extubation in OR  Informed Consent: I have reviewed the patients History and Physical, chart, labs and discussed the procedure including the risks, benefits and alternatives for the proposed anesthesia with the patient or authorized representative who has indicated his/her understanding and acceptance.   Dental advisory given  Plan Discussed with: CRNA and Anesthesiologist  Anesthesia Plan Comments:         Anesthesia Quick Evaluation

## 2014-06-29 NOTE — Discharge Instructions (Addendum)
Do not drive or engage in heavy physical activity for 24 hours  You will likely cough up small amounts of blood over the next few days  Call [phone] if you develop fever > 101, shortness of breath, chest pain or cough up large amounts of blood (> 1/2 cup)  [facility] will be in touch with you to schedule a follow up appointment    What to eat:  For your first meals, you should eat lightly; only small meals initially.  If you do not have nausea, you may eat larger meals.  Avoid spicy, greasy and heavy food.    General Anesthesia, Adult, Care After  Refer to this sheet in the next few weeks. These instructions provide you with information on caring for yourself after your procedure. Your health care provider may also give you more specific instructions. Your treatment has been planned according to current medical practices, but problems sometimes occur. Call your health care provider if you have any problems or questions after your procedure.  WHAT TO EXPECT AFTER THE PROCEDURE  After the procedure, it is typical to experience:  Sleepiness.  Nausea and vomiting. HOME CARE INSTRUCTIONS  For the first 24 hours after general anesthesia:  Have a responsible person with you.  Do not drive a car. If you are alone, do not take public transportation.  Do not drink alcohol.  Do not take medicine that has not been prescribed by your health care provider.  Do not sign important papers or make important decisions.  You may resume a normal diet and activities as directed by your health care provider.  Change bandages (dressings) as directed.  If you have questions or problems that seem related to general anesthesia, call the hospital and ask for the anesthetist or anesthesiologist on call. SEEK MEDICAL CARE IF:  You have nausea and vomiting that continue the day after anesthesia.  You develop a rash. SEEK IMMEDIATE MEDICAL CARE IF:  You have difficulty breathing.  You have chest pain.    You have any allergic problems. Document Released: 12/28/2000 Document Revised: 05/24/2013 Document Reviewed: 04/06/2013  [facility] Patient Information 2014 [place], [place].

## 2014-06-29 NOTE — Anesthesia Postprocedure Evaluation (Signed)
  Anesthesia Post-op Note  Patient: Christian Simmons  Procedure(s) Performed: Procedure(s): VIDEO BRONCHOSCOPY  (N/A)  Patient Location: PACU  Anesthesia Type:General  Level of Consciousness: awake, alert  and oriented  Airway and Oxygen Therapy: Patient Spontanous Breathing  Post-op Pain: mild  Post-op Assessment: Post-op Vital signs reviewed, Patient's Cardiovascular Status Stable, Respiratory Function Stable, Patent Airway and Pain level controlled  Post-op Vital Signs: stable  Last Vitals:  Filed Vitals:   06/29/14 1103  BP: 117/67  Pulse: 87  Temp:   Resp: 15    Complications: No apparent anesthesia complications

## 2014-07-02 ENCOUNTER — Other Ambulatory Visit: Payer: Self-pay | Admitting: *Deleted

## 2014-07-02 ENCOUNTER — Encounter: Payer: Self-pay | Admitting: *Deleted

## 2014-07-02 DIAGNOSIS — F419 Anxiety disorder, unspecified: Secondary | ICD-10-CM

## 2014-07-02 LAB — CULTURE, RESPIRATORY W GRAM STAIN

## 2014-07-02 LAB — CULTURE, RESPIRATORY

## 2014-07-02 MED ORDER — ALPRAZOLAM 0.5 MG PO TABS: 0.5000 mg | ORAL_TABLET | Freq: Once | ORAL | Status: DC | PRN

## 2014-07-02 NOTE — Progress Notes (Signed)
Patient ID: Christian Simmons, male   DOB: 23-Feb-1986, 29 y.o.   MRN: [id number] Mr. Alcala was scheduled for MRA BRAIN last week but was unable to complete the study due to severe anxiety.  The study has been rescheduled to a more open and quieter machine.  Dr. Roxan Hockey has also prescribed Xanax .5mg  prior to the procedure.  Mr. Witter is aware that a script is at the front desk for pick up today.

## 2014-07-03 ENCOUNTER — Encounter (HOSPITAL_COMMUNITY): Payer: Self-pay | Admitting: Thoracic Surgery (Cardiothoracic Vascular Surgery)

## 2014-07-03 ENCOUNTER — Inpatient Hospital Stay: Admission: RE | Admit: 2014-07-03 | Payer: Medicaid Other | Source: Ambulatory Visit

## 2014-07-03 NOTE — Op Note (Signed)
Christian Simmons, Christian Simmons            ACCOUNT NO.:  [id number]  MEDICAL RECORD NO.:  [id number]  LOCATION:  [facility]                         FACILITY:  [facility]  PHYSICIAN:  Revonda Standard. Roxan Hockey, M.D.DATE OF BIRTH:  03-28-1986  DATE OF PROCEDURE:  06/29/2014 DATE OF DISCHARGE:  06/29/2014                              OPERATIVE REPORT   PREOPERATIVE DIAGNOSIS:  Left upper lobe lung mass.  POSTOPERATIVE DIAGNOSIS:  Left upper lobe lung mass, carcinoma.  PROCEDURE:  Video bronchoscopy with brushings and biopsies.  SURGEON:  Revonda Standard. Roxan Hockey, M.D.  ANESTHESIA:  General.  FINDINGS:  Biopsies and brushings obtained from left upper lobe mass, biopsies positive for carcinoma.  CLINICAL NOTE:  Christian Simmons is a 28 year old gentleman originally from [place], who had a history of nasopharyngeal carcinoma with a lung metastasis, which had been previously resected in [place] with a wedge resection.  He now presents for follow up and a PET-CT scan shows a left upper lobe mass, which is hypermetabolic as well as hypermetabolic areas in the cervical lymph node chain and the nasopharynx and he was advised to undergo bronchoscopy for biopsy for diagnostic purposes.  The indications risks, benefits, and alternatives were discussed in detail with the patient.  He understood and accepted the risks and agreed to proceed.  OPERATIVE NOTE:  Christian Simmons was brought to the operating room on June 29, 2014.  He had induction of general anesthesia. Flexible fiberoptic bronchoscopy was performed via the endotracheal tube.  There was normal endobronchial anatomy.  There were no endobronchial lesions visible to the level of the subsegmental bronchi.  Using fluoroscopy as a guide, biopsies and brushings were obtained of the left upper lobe mass.  Brushings were performed 1st and these were sent for quick prep. While awaiting those results, biopsies were obtained from the area. There was some bleeding with the  biopsies, which was controlled with dilute epinephrine applied endobronchially.  The quick prep subsequently returned showing carcinoma.  The biopsies were sent for permanent pathology.  Final inspection was made and there was no ongoing bleeding. The bronchoscope was withdrawn.  The patient was extubated in the operating room and taken to the postanesthetic care unit in good condition.     Revonda Standard Roxan Hockey, M.D.    SCH/MEDQ  D:  07/02/2014  T:  07/03/2014  Job:  [id number]

## 2014-07-04 ENCOUNTER — Ambulatory Visit: Payer: Self-pay | Admitting: Radiation Oncology

## 2014-07-05 ENCOUNTER — Ambulatory Visit
Admission: RE | Admit: 2014-07-05 | Discharge: 2014-07-05 | Disposition: A | Discharge status: Home / Self Care | Payer: Medicaid Other | Source: Ambulatory Visit | Attending: Radiation Oncology | Admitting: Radiation Oncology

## 2014-07-05 ENCOUNTER — Telehealth: Payer: Self-pay

## 2014-07-05 VITALS — BP 121/68 | HR 73 | Temp 98.0°F | Wt 162.4 lb

## 2014-07-05 DIAGNOSIS — F40240 Claustrophobia: Secondary | ICD-10-CM | POA: Insufficient documentation

## 2014-07-05 DIAGNOSIS — C119 Malignant neoplasm of nasopharynx, unspecified: Secondary | ICD-10-CM | POA: Diagnosis not present

## 2014-07-05 DIAGNOSIS — Z510 Encounter for antineoplastic radiation therapy: Secondary | ICD-10-CM | POA: Insufficient documentation

## 2014-07-05 DIAGNOSIS — C7802 Secondary malignant neoplasm of left lung: Secondary | ICD-10-CM

## 2014-07-05 MED ORDER — LORAZEPAM 1 MG PO TABS: ORAL_TABLET | ORAL | Status: DC

## 2014-07-05 NOTE — Progress Notes (Signed)
   Department of Radiation Oncology  Phone:  [phone] Fax:        [phone]   Name: Christian Simmons MRN: [id number]  DOB: [birth year]  Date: 07/05/2014  Follow Up Visit Note  Diagnosis:    ICD-9-CM ICD-10-CM  1. Nasopharyngeal cancer 147.9 C11.9   Interval History: Christian Simmons presents today for routine followup.  Christian Simmons had his bronch and biopsy on 9/25. This showed metastatic cancer. Christian Simmons recovered well from that. Dr. Roxan Hockey ordered an MRI of the brain. Christian Simmons is anxious about doing that as Christian Simmons has significant claustrophobia. Christian Simmons continues to complain of pain along his right neck. Christian Simmons describes this as "jumping" of the muscle. This has been worse with exercise. Christian Simmons is still interested in pursuing radiation to his left upper lobe cancer  Physical Exam:  Filed Vitals:   07/05/14 1552  BP: 121/68  Pulse: 73  Temp: 98 F (36.7 C)  Weight: 162 lb 6.4 oz (73.664 kg)  SpO2: 100%   tight neck with scarring. Alert and oriented.   IMPRESSION: Christian Simmons is a 28 y.o. male with metastatic nasopharyngeal carcinoma to the lung.   PLAN:  I spoke with Marcy Siren again today regarding his diagnosis. We discussed palliative radiation to the left upper lobe. We discussed that his would provide local control to this one lesion but not likely extend his survival. I gave him Ativan to take for his MRI and will refer to physical therapy for his nick symptoms.  I have also encouraged him to keep his appt for the MRI.  I have scheduled him for simulation next week on Tuesday with plans to start the following week. Hopefully we will be able to approach this from a stereotactic standpoint,  I gave him information on our support groups and encouraged him to consider living with cancer.     Thea Silversmith, MD

## 2014-07-05 NOTE — Telephone Encounter (Signed)
Xanax script did not go through electronically from [facility] so it was called in from Dr.Wentworth's office by Nicholos Johns RN as previously written.Patient informed to pick up and given direction on how to take.

## 2014-07-05 NOTE — Addendum Note (Signed)
Encounter addended by: Arlyss Repress, RN on: 07/05/2014  5:03 PM<BR>     Documentation filed: Charges VN

## 2014-07-05 NOTE — Progress Notes (Signed)
Patient here as follow up new consult to discuss lung biopsy 06/29/14 reveals metastatic carcinoma.Denies pain.Weight up 4 lbs.[facility]Feeling  "blah".Scheduled for mri brain on 07/11/14.

## 2014-07-06 ENCOUNTER — Telehealth: Payer: Self-pay | Admitting: *Deleted

## 2014-07-06 NOTE — Telephone Encounter (Signed)
CALLED PATIENT TO INFORM THIS PATIENT OF HIS PT APPT. FOR 07-16-14- ARRIVAL TIME - 8:15 AM @ [facility], SPOKE WITH PATIENT AND HE IS AWARE OF THIS APPT.

## 2014-07-10 ENCOUNTER — Ambulatory Visit
Admission: RE | Admit: 2014-07-10 | Discharge: 2014-07-10 | Disposition: A | Discharge status: Home / Self Care | Payer: Medicaid Other | Source: Ambulatory Visit | Attending: Radiation Oncology | Admitting: Radiation Oncology

## 2014-07-10 ENCOUNTER — Encounter: Payer: Self-pay | Admitting: *Deleted

## 2014-07-10 DIAGNOSIS — Z510 Encounter for antineoplastic radiation therapy: Secondary | ICD-10-CM | POA: Diagnosis not present

## 2014-07-10 DIAGNOSIS — C7802 Secondary malignant neoplasm of left lung: Secondary | ICD-10-CM

## 2014-07-10 NOTE — Progress Notes (Signed)
[facility] Radiation Oncology Simulation and Treatment Planning Note   Name: Christian Simmons MRN: [id number]  Date: 07/10/2014  DOB: [birth year]  Status: outpatient    DIAGNOSIS: There were no encounter diagnoses.    SIDE: left   CONSENT VERIFIED: yes   SET UP AND IMMOBILIZATION: Patient is setup supine with arms in a wing board.   NARRATIVE: The patient was brought to the [facility].  Identity was confirmed.  All relevant records and images related to the planned course of therapy were reviewed.  Then, the patient was positioned in a stable reproducible clinical set-up for radiation therapy.  CT images were obtained.  Skin markings were placed.  The CT images were loaded into the planning software where the target and avoidance structures were contoured.  The radiation prescription was entered and confirmed.   TREATMENT PLANNING NOTE:  Treatment planning then occurred. I have requested 3D simulation with [facility] of the spinal cord, total lungs and gross tumor volume. I have also requested mlcs and an isodose plan.   Special treatment procedure will be performed as Lytle Michaels has received previous treatment to this area.   I have ordered a consult with the dietician for monitoring.  I will also be verifying that weekly lab values are appropriate.

## 2014-07-10 NOTE — Progress Notes (Signed)
To provide support and encouragement, care continuity and to assess for needs, met with patient and his sister during his planning Sim: 1. Provided a tour of RT area, specifically [place] [facility], explained procedure for tmt arrival and preparation. 2. Provided him an Epic calendar of currently scheduled appts to complement [facility] version. 3. Discussed his previously expressed interest in chemotherapy.  He indicated he no longer is interested, he believes upcoming appt with PT will help resolve the pain issues he is having in his neck. 4. He expressed understanding he can contact me with any questions/concerns. Continuing navigation as L1 patient (new patient).  Gayleen Orem, RN, BSN, [facility] [phone]

## 2014-07-11 ENCOUNTER — Other Ambulatory Visit: Payer: Medicaid Other

## 2014-07-16 ENCOUNTER — Ambulatory Visit: Payer: Medicaid Other | Attending: Radiation Oncology | Admitting: Physical Therapy

## 2014-07-16 DIAGNOSIS — I890 Lymphedema, not elsewhere classified: Secondary | ICD-10-CM | POA: Diagnosis not present

## 2014-07-16 DIAGNOSIS — Z5189 Encounter for other specified aftercare: Secondary | ICD-10-CM | POA: Diagnosis not present

## 2014-07-16 DIAGNOSIS — E039 Hypothyroidism, unspecified: Secondary | ICD-10-CM | POA: Diagnosis not present

## 2014-07-16 DIAGNOSIS — M436 Torticollis: Secondary | ICD-10-CM | POA: Insufficient documentation

## 2014-07-16 DIAGNOSIS — C119 Malignant neoplasm of nasopharynx, unspecified: Secondary | ICD-10-CM | POA: Insufficient documentation

## 2014-07-17 ENCOUNTER — Encounter: Payer: Self-pay | Admitting: Radiation Oncology

## 2014-07-17 ENCOUNTER — Ambulatory Visit
Admission: RE | Admit: 2014-07-17 | Discharge: 2014-07-17 | Disposition: A | Discharge status: Home / Self Care | Payer: Medicaid Other | Source: Ambulatory Visit | Attending: Radiation Oncology | Admitting: Radiation Oncology

## 2014-07-17 DIAGNOSIS — C119 Malignant neoplasm of nasopharynx, unspecified: Secondary | ICD-10-CM

## 2014-07-17 DIAGNOSIS — Z510 Encounter for antineoplastic radiation therapy: Secondary | ICD-10-CM | POA: Diagnosis not present

## 2014-07-17 NOTE — Progress Notes (Signed)
  Radiation Oncology         [phone] ________________________________  Name: Christian Simmons MRN: [id number]  Date: 07/17/2014  DOB: [birth year]  Simulation Verification Note  Status: outpatient  NARRATIVE: The patient was brought to the treatment unit and placed in the planned treatment position. The clinical setup was verified. Then port films were obtained and uploaded to the radiation oncology medical record software.  The treatment beams were carefully compared against the planned radiation fields. The position location and shape of the radiation fields was reviewed. The targeted volume of tissue appears appropriately covered by the radiation beams. Organs at risk appear to be excluded as planned.  Based on my personal review, I approved the simulation verification. The patient's treatment will proceed as planned.  ------------------------------------------------  Thea Silversmith, MD

## 2014-07-18 ENCOUNTER — Inpatient Hospital Stay: Admission: RE | Admit: 2014-07-18 | Payer: Medicaid Other | Source: Ambulatory Visit

## 2014-07-18 ENCOUNTER — Ambulatory Visit
Admission: RE | Admit: 2014-07-18 | Discharge: 2014-07-18 | Disposition: A | Discharge status: Home / Self Care | Payer: Medicaid Other | Source: Ambulatory Visit | Attending: Radiation Oncology | Admitting: Radiation Oncology

## 2014-07-18 DIAGNOSIS — Z510 Encounter for antineoplastic radiation therapy: Secondary | ICD-10-CM | POA: Diagnosis not present

## 2014-07-19 ENCOUNTER — Ambulatory Visit
Admission: RE | Admit: 2014-07-19 | Discharge: 2014-07-19 | Disposition: A | Discharge status: Home / Self Care | Payer: Medicaid Other | Source: Ambulatory Visit | Attending: Radiation Oncology | Admitting: Radiation Oncology

## 2014-07-19 DIAGNOSIS — Z510 Encounter for antineoplastic radiation therapy: Secondary | ICD-10-CM | POA: Diagnosis not present

## 2014-07-20 ENCOUNTER — Ambulatory Visit
Admission: RE | Admit: 2014-07-20 | Discharge: 2014-07-20 | Disposition: A | Discharge status: Home / Self Care | Payer: Medicaid Other | Source: Ambulatory Visit | Attending: Radiation Oncology | Admitting: Radiation Oncology

## 2014-07-20 DIAGNOSIS — C7802 Secondary malignant neoplasm of left lung: Secondary | ICD-10-CM | POA: Insufficient documentation

## 2014-07-20 DIAGNOSIS — Z510 Encounter for antineoplastic radiation therapy: Secondary | ICD-10-CM | POA: Insufficient documentation

## 2014-07-20 MED ORDER — RADIAPLEXRX EX GEL
Freq: Once | CUTANEOUS | Status: AC
  Administered 2014-07-20: 12:00:00 via TOPICAL

## 2014-07-20 NOTE — Progress Notes (Signed)
Routine of clinic reviewed.Discuseed potential side effects to include fatigue, skin discoloration, shortness of breath or cough.Denies pain.Push po fluids for hydration up to 6 to 8  Oz/day.Given radiaplex apply to affected area if needed twice daily.Given my card.Patient declined Radiation Therapy and You Booklet.Lnows if he has any questions to inform therapist so he may be seen while in office.

## 2014-07-20 NOTE — Anesthesia Postprocedure Evaluation (Signed)
  Anesthesia Post-op Note  Patient: Christian Simmons  Procedure(s) Performed: Procedure(s): VIDEO BRONCHOSCOPY  (N/A)  Patient Location: PACU  Anesthesia Type:General  Level of Consciousness: awake, alert  and oriented  Airway and Oxygen Therapy: Patient Spontanous Breathing and Patient connected to nasal cannula oxygen  Post-op Pain: mild  Post-op Assessment: Post-op Vital signs reviewed, Patient's Cardiovascular Status Stable, Respiratory Function Stable, Patent Airway and Pain level controlled  Post-op Vital Signs: stable  Last Vitals:  Filed Vitals:   06/29/14 1103  BP: 117/67  Pulse: 87  Temp:   Resp: 15    Complications: No apparent anesthesia complications

## 2014-07-23 ENCOUNTER — Ambulatory Visit: Payer: Medicaid Other | Admitting: Physical Therapy

## 2014-07-23 ENCOUNTER — Ambulatory Visit
Admission: RE | Admit: 2014-07-23 | Discharge: 2014-07-23 | Disposition: A | Discharge status: Home / Self Care | Payer: Medicaid Other | Source: Ambulatory Visit | Attending: Radiation Oncology | Admitting: Radiation Oncology

## 2014-07-23 DIAGNOSIS — Z510 Encounter for antineoplastic radiation therapy: Secondary | ICD-10-CM | POA: Diagnosis not present

## 2014-07-24 ENCOUNTER — Inpatient Hospital Stay
Admission: RE | Admit: 2014-07-24 | Discharge: 2014-07-24 | Disposition: A | Discharge status: Home / Self Care | Payer: Self-pay | Source: Ambulatory Visit | Attending: Radiation Oncology | Admitting: Radiation Oncology

## 2014-07-24 ENCOUNTER — Ambulatory Visit
Admission: RE | Admit: 2014-07-24 | Discharge: 2014-07-24 | Disposition: A | Discharge status: Home / Self Care | Payer: Medicaid Other | Source: Ambulatory Visit | Attending: Radiation Oncology | Admitting: Radiation Oncology

## 2014-07-24 VITALS — BP 118/66 | HR 99 | Temp 98.6°F | Wt 162.9 lb

## 2014-07-24 DIAGNOSIS — C7800 Secondary malignant neoplasm of unspecified lung: Secondary | ICD-10-CM

## 2014-07-24 DIAGNOSIS — Z510 Encounter for antineoplastic radiation therapy: Secondary | ICD-10-CM | POA: Diagnosis not present

## 2014-07-24 NOTE — Progress Notes (Signed)
Weekly assessment of radiation to left lung.Completed 5 of 10 treatments.Productive cough of clear secretions.Getting over cold symptoms and fever from this weekend.Instructed to go to [facility] to get refill on synthroid.

## 2014-07-24 NOTE — Progress Notes (Signed)
  Radiation Oncology         [phone] ________________________________  Name: Leshawn Straka MRN: [id number]  Date: 07/24/2014  DOB: 01-Dec-1985  Weekly Radiation Therapy Management  Nasopharyngeal cancer   Primary site: Pharynx - Nasopharynx   Staging method: AJCC 7th Edition   Clinical: Stage IVC (T3, N2, M1)   Summary: Stage IVC (T3, N2, M1)   Current Dose: 20 Gy     Planned Dose:  40 Gy projected at the left lung area  Narrative . . . . . . . . The patient presents for routine under treatment assessment.                                   The patient is without complaint except for some dizziness with standing. Patient was noted to have orthostatic blood pressure changes and was recommended he force fluids                                 Set-up films were reviewed.                                 The chart was checked. Physical Findings. . .  weight is 162 lb 14.4 oz (73.891 kg). His temperature is 98.6 F (37 C). His blood pressure is 118/66 and his pulse is 99. His oxygen saturation is 100%. . Weight essentially stable.  The lungs are clear. The heart has a regular rhythm and rate. Impression . . . . . . . The patient is tolerating radiation. Plan . . . . . . . . . . . . Continue treatment as planned.  ________________________________   Blair Promise, PhD, MD

## 2014-07-25 ENCOUNTER — Ambulatory Visit
Admission: RE | Admit: 2014-07-25 | Discharge: 2014-07-25 | Disposition: A | Discharge status: Home / Self Care | Payer: Medicaid Other | Source: Ambulatory Visit | Attending: Radiation Oncology | Admitting: Radiation Oncology

## 2014-07-25 DIAGNOSIS — Z510 Encounter for antineoplastic radiation therapy: Secondary | ICD-10-CM | POA: Diagnosis not present

## 2014-07-26 ENCOUNTER — Ambulatory Visit
Admission: RE | Admit: 2014-07-26 | Discharge: 2014-07-26 | Disposition: A | Discharge status: Home / Self Care | Payer: Medicaid Other | Source: Ambulatory Visit | Attending: Radiation Oncology | Admitting: Radiation Oncology

## 2014-07-26 DIAGNOSIS — Z510 Encounter for antineoplastic radiation therapy: Secondary | ICD-10-CM | POA: Diagnosis not present

## 2014-07-27 ENCOUNTER — Ambulatory Visit
Admission: RE | Admit: 2014-07-27 | Discharge: 2014-07-27 | Disposition: A | Discharge status: Home / Self Care | Payer: Medicaid Other | Source: Ambulatory Visit | Attending: Radiation Oncology | Admitting: Radiation Oncology

## 2014-07-27 ENCOUNTER — Other Ambulatory Visit: Payer: Self-pay | Admitting: Radiation Oncology

## 2014-07-27 DIAGNOSIS — Z510 Encounter for antineoplastic radiation therapy: Secondary | ICD-10-CM | POA: Diagnosis not present

## 2014-07-27 LAB — FUNGUS CULTURE W SMEAR: Fungal Smear: NONE SEEN

## 2014-07-27 MED ORDER — OXYCODONE-ACETAMINOPHEN 5-325 MG PO TABS: 1.0000 | ORAL_TABLET | Freq: Four times a day (QID) | ORAL | Status: DC | PRN

## 2014-07-27 MED ORDER — SUCRALFATE 1 G PO TABS: 1.0000 g | ORAL_TABLET | Freq: Four times a day (QID) | ORAL | Status: DC

## 2014-07-30 ENCOUNTER — Ambulatory Visit
Admission: RE | Admit: 2014-07-30 | Discharge: 2014-07-30 | Disposition: A | Discharge status: Home / Self Care | Payer: Medicaid Other | Source: Ambulatory Visit | Attending: Radiation Oncology | Admitting: Radiation Oncology

## 2014-07-30 DIAGNOSIS — Z510 Encounter for antineoplastic radiation therapy: Secondary | ICD-10-CM | POA: Diagnosis not present

## 2014-07-31 ENCOUNTER — Ambulatory Visit
Admission: RE | Admit: 2014-07-31 | Discharge: 2014-07-31 | Disposition: A | Discharge status: Home / Self Care | Payer: Medicaid Other | Source: Ambulatory Visit | Attending: Radiation Oncology | Admitting: Radiation Oncology

## 2014-07-31 ENCOUNTER — Encounter: Payer: Self-pay | Admitting: Radiation Oncology

## 2014-07-31 VITALS — BP 120/57 | HR 79 | Temp 98.2°F | Wt 165.0 lb

## 2014-07-31 DIAGNOSIS — Z510 Encounter for antineoplastic radiation therapy: Secondary | ICD-10-CM | POA: Diagnosis not present

## 2014-07-31 DIAGNOSIS — C7800 Secondary malignant neoplasm of unspecified lung: Secondary | ICD-10-CM

## 2014-07-31 MED ORDER — OXYCODONE-ACETAMINOPHEN 5-325 MG PO TABS: 1.0000 | ORAL_TABLET | Freq: Four times a day (QID) | ORAL | Status: DC | PRN

## 2014-07-31 NOTE — Progress Notes (Signed)
Patient completes 10 of 10 treatments to left lung metastatic right head/neck cancer.Pain is mild "2".Takes percocet which he will need refill by Friday 08/03/14.has one month follow up appointment.No skin changes.Esophageal discomfort better on carafate

## 2014-07-31 NOTE — Progress Notes (Signed)
  Radiation Oncology         (336) [phone] ________________________________  Name: Christian Simmons MRN: [id number]  Date: 07/31/2014  DOB: 1986-07-18  Weekly Radiation Therapy Management  Current Dose: 40 Gy     Planned Dose:  40 Gy  Narrative . . . . . . . . The patient presents for routine under treatment assessment.                                   The patient is without complaint. He is happy to complete his radiation therapy today. He denies any pain with swallowing or difficulty swallowing. Patient's pain medication was refilled today. No breathing issues                                 Set-up films were reviewed.                                 The chart was checked. Physical Findings. . .  weight is 165 lb (74.844 kg). His temperature is 98.2 F (36.8 C). His blood pressure is 120/57 and his pulse is 79. His oxygen saturation is 100%. . Weight essentially stable.  No significant changes. Impression . . . . . . . The patient is tolerating radiation. Plan . . . . . . . . . . .  followup in radiation oncology in one month  ________________________________   Blair Promise, PhD, MD

## 2014-08-04 NOTE — Progress Notes (Signed)
  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    Narrative: The patient tolerated radiation treatment relatively well.   He had no ill effects from treatment.   Plan: The patient has completed radiation treatment. The patient will return to radiation oncology clinic for routine followup in one month. I advised them to call or return sooner if they have any questions or concerns related to their recovery or treatment.  ------------------------------------------------  Thea Silversmith, MD

## 2014-08-06 ENCOUNTER — Ambulatory Visit: Payer: Medicaid Other | Attending: Radiation Oncology | Admitting: Physical Therapy

## 2014-08-06 DIAGNOSIS — C119 Malignant neoplasm of nasopharynx, unspecified: Secondary | ICD-10-CM | POA: Insufficient documentation

## 2014-08-06 DIAGNOSIS — E039 Hypothyroidism, unspecified: Secondary | ICD-10-CM | POA: Insufficient documentation

## 2014-08-06 DIAGNOSIS — Z5189 Encounter for other specified aftercare: Secondary | ICD-10-CM | POA: Insufficient documentation

## 2014-08-06 DIAGNOSIS — M25611 Stiffness of right shoulder, not elsewhere classified: Secondary | ICD-10-CM

## 2014-08-06 DIAGNOSIS — M542 Cervicalgia: Secondary | ICD-10-CM

## 2014-08-06 DIAGNOSIS — I890 Lymphedema, not elsewhere classified: Secondary | ICD-10-CM | POA: Diagnosis not present

## 2014-08-06 DIAGNOSIS — M436 Torticollis: Secondary | ICD-10-CM | POA: Insufficient documentation

## 2014-08-06 DIAGNOSIS — G8929 Other chronic pain: Secondary | ICD-10-CM

## 2014-08-06 NOTE — Patient Instructions (Addendum)
Cane Exercise: Abduction   Hold cane with right hand over end, palm-up, with other hand palm-down. Move arm out from side and up by pushing with other arm. Hold _5___ seconds. Repeat _5___ times. Do __2__ sessions per day.  http://gt2.exer.us/81   Copyright  VHI. All rights reserved.  Cane Exercise: Flexion   Lie on back, holding cane above chest. Keeping arms as straight as possible, lower cane toward floor beyond head. Hold _5___ seconds. Repeat ___5_ times. Do _2___ sessions per day.  http://gt2.exer.us/91   Copyright  VHI. All rights reserved.  Cane Exercise: Abduction   Hold cane with right hand over end, palm-up, with other hand palm-down. Move arm out from side and up by pushing with other arm. Hold __5__ seconds. Repeat _5__ times. Do __2__ sessions per day.   Side-Bending   One hand on opposite side of head, pull head to side as far as is comfortable. Stop if there is pain. Hold _5___ seconds. Repeat with other hand to other side. Repeat _5___ times. Do ___3_ sessions per day.   Copyright  VHI. All rights reserved.

## 2014-08-06 NOTE — Therapy (Signed)
Physical Therapy Treatment  Patient Details  Name: Havard Radigan MRN: [id number] Date of Birth: 12/20/1985  Encounter Date: 08/06/2014      PT End of Session - 08/06/14 1229    Visit Number 1   Number of Visits 3   PT Stop Time 39      Past Medical History  Diagnosis Date  . Neuropathy   . Fatigue 06/01/2014  . 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 Hypothyroidism     Past Surgical History  Procedure Laterality Date  . Lung removal, partial  05/03/2012    left upper lobectomy  . Radical neck dissection    . Nasal biopsy    . Video bronchoscopy N/A 06/29/2014    Procedure: VIDEO BRONCHOSCOPY ;  Surgeon: Melrose Nakayama, MD;  Location: [place];  Service: Thoracic;  Laterality: N/A;    There were no vitals taken for this visit.  Visit Diagnosis:  Neck stiffness  Neck pain of over 3 months duration  Shoulder stiffness, right    Treatment:  Moist heat pack applies to neck and right shoulder in sidelying, with patient reporting symptomatic benefit.  Then, he received myofascial release and trigger point work at upper trap, middle trap and scalene muscles.  Myofascial stretching to right lateral neck.  He performed cane exercise for stretching to shoulders.  He reported some relief at the end of treatment.             Education - 08/06/14 1228    Education provided Yes   Education Details Patient   Methods Explanation;Demonstration;Handout;Tactile cues;Verbal cues   Comprehension Returned demonstration              Plan - 08/06/14 1230    Clinical Impression Statement Mr. Golubski responded well to moist heat pack and soft tissue mobilization of upper trapezius and cervical muscles. He continues to have tight band of scar tissue along sternocleidomastoid muscle.  He was able to perform stretching shoulder exercises.   Pt will benefit from skilled therapeutic intervention in order to improve on the following deficits  Pain;Decreased range of motion;Increased fascial restricitons;Increased muscle spasms   Rehab Potential Good   PT Frequency Min 1X/week   PT Duration Other (comment)  3 weeks   PT Treatment/Interventions Manual techniques;Modalities;Therapeutic exercise;Patient/family education   PT Plan provide moist heat first, then myofascial release and trigger point release in addistion to exercise and soft tissue mobilitzaion        Problem List Patient Active Problem List   Diagnosis Date Noted  . Hypothyroidism 06/09/2014  . 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 Fatigue 06/01/2014                                          Long Term Clinic Goals - 08/06/14 1240    Title verbalize good understanding of the maintenance phase of tretment including manual lymph drainage, use of compression, and lymphedema risk reduction  practices   Baseline no knowledge   Time 3   Period Weeks   Status On-going   Title be independent with a home exercise program   Baseline no knowledge   Time 3   Period Weeks   Status On-going   Title report overall pain decreased to 3/10 or less to tolerate daily tasks with less pain   Baseline no knowledge  Time 3   Period Weeks   Status On-going          Norwood Levo 08/06/2014, 12:52 PM

## 2014-08-11 LAB — AFB CULTURE WITH SMEAR (NOT AT ARMC): Acid Fast Smear: NONE SEEN

## 2014-08-11 NOTE — Progress Notes (Signed)
5 MLCs were created on 10/6 to comprise 5 complex treatment devices.

## 2014-08-15 ENCOUNTER — Ambulatory Visit: Payer: Medicaid Other

## 2014-08-15 DIAGNOSIS — M25611 Stiffness of right shoulder, not elsewhere classified: Secondary | ICD-10-CM

## 2014-08-15 DIAGNOSIS — M436 Torticollis: Secondary | ICD-10-CM

## 2014-08-15 DIAGNOSIS — Z5189 Encounter for other specified aftercare: Secondary | ICD-10-CM | POA: Diagnosis not present

## 2014-08-15 DIAGNOSIS — M542 Cervicalgia: Secondary | ICD-10-CM

## 2014-08-15 DIAGNOSIS — G8929 Other chronic pain: Secondary | ICD-10-CM

## 2014-08-15 NOTE — Therapy (Signed)
Physical Therapy Treatment  Patient Details  Name: Moosa Bueche MRN: [id number] Date of Birth: 1986-06-10  Encounter Date: 08/15/2014      PT End of Session - 08/15/14 1718    Visit Number 2   Number of Visits 3   PT Start Time 2725   PT Stop Time 1709   PT Time Calculation (min) 45 min      Past Medical History  Diagnosis Date  . Neuropathy   . Fatigue 06/01/2014  . 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 Hypothyroidism     Past Surgical History  Procedure Laterality Date  . Lung removal, partial  05/03/2012    left upper lobectomy  . Radical neck dissection    . Nasal biopsy    . Video bronchoscopy N/A 06/29/2014    Procedure: VIDEO BRONCHOSCOPY ;  Surgeon: Melrose Nakayama, MD;  Location: [place];  Service: Thoracic;  Laterality: N/A;    There were no vitals taken for this visit.  Visit Diagnosis:  No diagnosis found.        [facility] PT Assessment - 08/15/14 0001    AROM   Right Shoulder Flexion 122 Degrees   Right Shoulder ABduction 73 Degrees          [facility] Adult PT Treatment/Exercise - 08/15/14 0001    Posture/Postural Control   Posture Comments Sat patient in front of mirror to instruct on how incorrect posture is limiting his shoulder and cervical ROM, patient able to demonstrate correct technique after instruction.   Manual Therapy   Manual Therapy Massage   Massage Soft tissue work to Rt sternocleidomastoid, upper trapezius and scalenes with cervical PROM throughout to patients end ROM.          PT Education - 08/15/14 1714    Education provided Yes   Education Details Instructed patient on correct posture throughout day and to continue cane exercises but with corret posture.    Person(s) Educated Patient   Methods Explanation;Demonstration   Comprehension Verbalized understanding;Returned demonstration              Problem List Patient Active Problem List   Diagnosis Date Noted  . Hypothyroidism  06/09/2014  . 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 Fatigue 06/01/2014                                            Long Term Clinic Goals - 08/15/14 1719    CC Long Term Goal  #1   Title verbalize good understanding of the maintenance phase of tretment including manual lymph drainage, use of compression, and lymphedema risk reduction  practices   Time 3   Period Weeks   Status On-going   CC Long Term Goal  #2   Title be independent with a home exercise program   Time 3   Period Weeks   Status Partially Met   CC Long Term Goal  #3   Title report overall pain decreased to 3/10 or less to tolerate daily tasks with less pain   Time 3   Period Weeks   Status Partially Met          Otelia Limes , PTA  08/15/2014, 5:21 PM

## 2014-08-21 ENCOUNTER — Other Ambulatory Visit: Payer: Self-pay | Admitting: Infectious Disease

## 2014-08-21 ENCOUNTER — Ambulatory Visit
Admission: RE | Admit: 2014-08-21 | Discharge: 2014-08-21 | Disposition: A | Discharge status: Home / Self Care | Payer: No Typology Code available for payment source | Source: Ambulatory Visit | Attending: Infectious Disease | Admitting: Infectious Disease

## 2014-08-21 ENCOUNTER — Ambulatory Visit: Payer: Medicaid Other

## 2014-08-21 DIAGNOSIS — M25611 Stiffness of right shoulder, not elsewhere classified: Secondary | ICD-10-CM

## 2014-08-21 DIAGNOSIS — M436 Torticollis: Secondary | ICD-10-CM

## 2014-08-21 DIAGNOSIS — G8929 Other chronic pain: Secondary | ICD-10-CM

## 2014-08-21 DIAGNOSIS — R7611 Nonspecific reaction to tuberculin skin test without active tuberculosis: Secondary | ICD-10-CM

## 2014-08-21 DIAGNOSIS — Z5189 Encounter for other specified aftercare: Secondary | ICD-10-CM | POA: Diagnosis not present

## 2014-08-21 DIAGNOSIS — M542 Cervicalgia: Secondary | ICD-10-CM

## 2014-08-21 NOTE — Therapy (Signed)
Physical Therapy Treatment  Patient Details  Name: Christian Simmons MRN: [id number] Date of Birth: March 23, 1986  Encounter Date: 08/21/2014      PT End of Session - 08/21/14 0847    Visit Number 3   Number of Visits 3   PT Start Time 0805   PT Stop Time 0900   PT Time Calculation (min) 55 min      Past Medical History  Diagnosis Date  . Neuropathy   . Fatigue 06/01/2014  . 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 Hypothyroidism     Past Surgical History  Procedure Laterality Date  . Lung removal, partial  05/03/2012    left upper lobectomy  . Radical neck dissection    . Nasal biopsy    . Video bronchoscopy N/A 06/29/2014    Procedure: VIDEO BRONCHOSCOPY ;  Surgeon: Melrose Nakayama, MD;  Location: [place];  Service: Thoracic;  Laterality: N/A;    There were no vitals taken for this visit.  Visit Diagnosis:  Neck stiffness  Neck pain of over 3 months duration  Shoulder stiffness, right      Subjective Assessment - 08/21/14 0810    Symptoms Pain is getting better with stretching, feel like i am improving slowly. Pain throughout day now only when stretching and this has gone down to a 2/10.             [facility] Adult PT Treatment/Exercise - 08/21/14 0001    Shoulder Exercises: Supine   Other Supine Exercises On towel roll for bil UE horizontal abduction 20 reps, then in a "V" (scaption) 20 reps   Manual Therapy   Massage Supine: Soft tissue work to Rt sternocleidomastoid, upper trap and scalenes with cervical PROM throughout into Lt side bend and rotation.          PT Education - 08/21/14 0851    Education provided Yes   Education Details Lymphedema risk reduction practices   Person(s) Educated Patient   Methods Explanation   Comprehension Verbalized understanding              Plan - 08/21/14 0847    Clinical Impression Statement Patient has improved with less pain with AROM and is compliant with HEP. Patient is going  to apply for financial assistance with hospital to try to get more visits.   PT Next Visit Plan Patient on hold for 2 weeks to try to apply for financial assistance through hospital, if unable to get this, discharge patient at that time.         Problem List Patient Active Problem List   Diagnosis Date Noted  . Hypothyroidism 06/09/2014  . 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 Fatigue 06/01/2014                                            Long Term Clinic Goals - 08/21/14 0850    CC Long Term Goal  #1   Title verbalize good understanding of the maintenance phase of tretment including manual lymph drainage, use of compression, and lymphedema risk reduction  practices   Baseline no knowledge   Time 3   Period Weeks   Status Achieved   CC Long Term Goal  #2   Title be independent with a home exercise program   Baseline no knowledge   Time 3  Period Weeks   Status Achieved   CC Long Term Goal  #3   Title report overall pain decreased to 3/10 or less to tolerate daily tasks with less pain   Baseline no knowledge   Time 3   Period Weeks   Status Achieved          Otelia Limes, PTA 08/21/2014, 10:02 AM

## 2014-08-29 ENCOUNTER — Telehealth: Payer: Self-pay

## 2014-08-29 ENCOUNTER — Other Ambulatory Visit: Payer: Self-pay | Admitting: Radiation Oncology

## 2014-08-29 ENCOUNTER — Encounter: Payer: Self-pay | Admitting: Radiation Oncology

## 2014-08-29 MED ORDER — OXYCODONE-ACETAMINOPHEN 5-325 MG PO TABS: 1.0000 | ORAL_TABLET | Freq: Four times a day (QID) | ORAL | Status: DC | PRN

## 2014-08-29 NOTE — Progress Notes (Signed)
Patient called for refill of pain medication. Was started on by one of my partners and refilled x 1. Per notes, pain is in neck and patient has undergone PT for this. Patient was not on pain medication for neck pain when I saw him in consult and yet had neck pain (thus the reason for PT referral). Per PT note, pain is decreased through PT. Unfortunately medicaid will not pay for more sessions of physical therapy.  I will refill x 1 but he will not be getting other refills from this office.

## 2014-08-29 NOTE — Telephone Encounter (Signed)
Patient left voice message requesting refill on percocet.This is the last refill on this medication from radiation oncology.He already spoke with Ander Purpura and she informed him that she will not be able to help him with extension on physical therapy as he was only allowed 3 visits.

## 2014-09-03 ENCOUNTER — Encounter: Payer: Self-pay | Admitting: *Deleted

## 2014-09-03 NOTE — Progress Notes (Signed)
[place] Work  Clinical Social Work was referred by Physical therapist (per patient) for assistance with obtaining more physical therapy treatments.  CSW contacted cancer outpatient rehab- unaware of any resources- Medicaid will only pay for 3 sessions.  CSW contacted patient by phone and informed him unaware of any additional resources.  Patient verbalized understanding.   Polo Riley, MSW, LCSW, OSW-C Clinical Social Worker [facility] [phone]

## 2014-09-11 ENCOUNTER — Other Ambulatory Visit: Payer: Self-pay

## 2014-09-11 ENCOUNTER — Ambulatory Visit: Payer: Self-pay | Admitting: Hematology and Oncology

## 2014-09-13 ENCOUNTER — Telehealth: Payer: Self-pay | Admitting: *Deleted

## 2014-09-13 ENCOUNTER — Ambulatory Visit: Payer: Medicaid Other | Admitting: Radiation Oncology

## 2014-09-13 NOTE — Telephone Encounter (Signed)
Left message to follow up on missed appointment.

## 2014-10-09 ENCOUNTER — Telehealth: Payer: Self-pay | Admitting: *Deleted

## 2014-10-09 ENCOUNTER — Telehealth: Payer: Self-pay | Admitting: Hematology and Oncology

## 2014-10-09 ENCOUNTER — Ambulatory Visit
Admission: RE | Admit: 2014-10-09 | Discharge: 2014-10-09 | Disposition: A | Discharge status: Home / Self Care | Payer: Medicaid Other | Source: Ambulatory Visit | Attending: Radiation Oncology | Admitting: Radiation Oncology

## 2014-10-09 VITALS — BP 112/64 | HR 80 | Temp 97.4°F | Wt 165.4 lb

## 2014-10-09 DIAGNOSIS — C7800 Secondary malignant neoplasm of unspecified lung: Secondary | ICD-10-CM

## 2014-10-09 NOTE — Telephone Encounter (Signed)
dr Pablo Ledger office call to sched pt for appt they will contact pt with appt

## 2014-10-09 NOTE — Telephone Encounter (Signed)
CALLED PATIENT TO INFORM OF LAB, TEST, FU WITH DR. Alvy Bimler AND DR. Pablo Ledger, LVM FOR A RETURN CALL

## 2014-10-09 NOTE — Progress Notes (Signed)
Patient here for routine follow up post radiation to left upper lung.Continues to have right neck pain.States he only takes 2 percocet/day.One month fu was rescheduled as he started a new job.Patient not taking synthroid daily as prescribed/Informed him to continue to take daily.He needs to reschedule fu with Dr.Gorsuch and repeat tsh as this appointment was missed 09/11/14.

## 2014-10-09 NOTE — Progress Notes (Signed)
   Department of Radiation Oncology  Phone:  [phone] Fax:        [phone]   Name: Christian Simmons MRN: [id number]  DOB: April 23, 1986  Date: 10/09/2014  Follow Up Visit Note  Diagnosis: Nasopharyngeal cancer   Staging form: Pharynx - Nasopharynx, AJCC 7th Edition     Clinical: Stage IVC (T3, N2, M1) - Signed by [name], MD on 06/03/2014  Summary and Interval since last radiation: 2 months from 40 Gy to the left upper lobe completed 07/31/14  Interval History: Christian Simmons presents today for routine followup.  He has a job and is doing well. He has no cough or shortness of breath. Due to his medicaid he was only able to see PT 3 times but thought this was helpful. He is not doing his at home exercises. He is taking 1-2 pain pills a day. He requests a refill. He is not taking his synthroid. He is worried about a place of swelling in his left neck and asked that I check that out. He missed his follow up with Dr. Alvy Bimler.   Physical Exam:  Filed Vitals:   10/09/14 0811  BP: 112/64  Pulse: 80  Temp: 97.4 F (36.3 C)  Weight: 165 lb 6.4 oz (75.025 kg)  SpO2: 100%   Scarring and tightness over right neck. No palpable abnormalities of the left neck in region he is concerned about.   IMPRESSION: Christian Simmons is a 29 y.o. male s/p palliative RT to the left upper lobe metastases with no acute effects of treatment.   PLAN:  We discussed using synthroid daily and his need for follow up imaging. I have ordered a CT of the neck and chest along with follow up with Dr. Alvy Bimler.  We need to follow up on that area of concern in his right neck. I will see him back in 6 months. I would recommend CT of the chest every 6 months to monitor this lesion.     Thea Silversmith, MD

## 2014-10-11 ENCOUNTER — Ambulatory Visit
Admission: RE | Admit: 2014-10-11 | Discharge: 2014-10-11 | Disposition: A | Discharge status: Home / Self Care | Payer: Medicaid Other | Source: Ambulatory Visit | Attending: Radiation Oncology | Admitting: Radiation Oncology

## 2014-10-11 DIAGNOSIS — C7800 Secondary malignant neoplasm of unspecified lung: Secondary | ICD-10-CM

## 2014-10-11 LAB — BUN AND CREATININE (CC13)
BUN: 15.3 mg/dL (ref 7.0–26.0)
CREATININE: 1.2 mg/dL (ref 0.7–1.3)
EGFR: >90 mL/min/{1.73_m2} (ref >90)

## 2014-10-12 ENCOUNTER — Ambulatory Visit (HOSPITAL_COMMUNITY)
Admission: RE | Admit: 2014-10-12 | Discharge: 2014-10-12 | Disposition: A | Discharge status: Home / Self Care | Payer: Medicaid Other | Source: Ambulatory Visit | Attending: Radiation Oncology | Admitting: Radiation Oncology

## 2014-10-12 ENCOUNTER — Ambulatory Visit (HOSPITAL_COMMUNITY): Payer: Medicaid Other

## 2014-10-12 ENCOUNTER — Ambulatory Visit: Payer: Medicaid Other

## 2014-10-12 ENCOUNTER — Encounter (HOSPITAL_COMMUNITY): Payer: Self-pay

## 2014-10-12 ENCOUNTER — Ambulatory Visit: Payer: Medicaid Other | Admitting: Radiation Oncology

## 2014-10-12 DIAGNOSIS — R911 Solitary pulmonary nodule: Secondary | ICD-10-CM | POA: Insufficient documentation

## 2014-10-12 DIAGNOSIS — C7800 Secondary malignant neoplasm of unspecified lung: Secondary | ICD-10-CM | POA: Diagnosis not present

## 2014-10-12 DIAGNOSIS — C119 Malignant neoplasm of nasopharynx, unspecified: Secondary | ICD-10-CM | POA: Diagnosis not present

## 2014-10-12 DIAGNOSIS — M542 Cervicalgia: Secondary | ICD-10-CM | POA: Insufficient documentation

## 2014-10-12 MED ORDER — IOHEXOL 300 MG/ML  SOLN
100.0000 mL | Freq: Once | INTRAMUSCULAR | Status: AC | PRN
  Administered 2014-10-12: 100 mL via INTRAVENOUS

## 2014-10-25 ENCOUNTER — Other Ambulatory Visit (HOSPITAL_BASED_OUTPATIENT_CLINIC_OR_DEPARTMENT_OTHER): Payer: Medicaid Other

## 2014-10-25 ENCOUNTER — Ambulatory Visit (HOSPITAL_BASED_OUTPATIENT_CLINIC_OR_DEPARTMENT_OTHER): Payer: Medicaid Other | Admitting: Hematology and Oncology

## 2014-10-25 ENCOUNTER — Encounter: Payer: Self-pay | Admitting: Hematology and Oncology

## 2014-10-25 ENCOUNTER — Other Ambulatory Visit: Payer: Self-pay | Admitting: Hematology and Oncology

## 2014-10-25 ENCOUNTER — Telehealth: Payer: Self-pay | Admitting: Hematology and Oncology

## 2014-10-25 ENCOUNTER — Encounter: Payer: Self-pay | Admitting: *Deleted

## 2014-10-25 ENCOUNTER — Telehealth: Payer: Self-pay | Admitting: *Deleted

## 2014-10-25 VITALS — BP 123/71 | HR 70 | Temp 98.1°F | Resp 20 | Ht 66.0 in | Wt 160.7 lb

## 2014-10-25 DIAGNOSIS — G620 Drug-induced polyneuropathy: Secondary | ICD-10-CM | POA: Insufficient documentation

## 2014-10-25 DIAGNOSIS — E039 Hypothyroidism, unspecified: Secondary | ICD-10-CM

## 2014-10-25 DIAGNOSIS — E038 Other specified hypothyroidism: Secondary | ICD-10-CM

## 2014-10-25 DIAGNOSIS — C119 Malignant neoplasm of nasopharynx, unspecified: Secondary | ICD-10-CM

## 2014-10-25 DIAGNOSIS — C7802 Secondary malignant neoplasm of left lung: Secondary | ICD-10-CM

## 2014-10-25 DIAGNOSIS — C7800 Secondary malignant neoplasm of unspecified lung: Secondary | ICD-10-CM

## 2014-10-25 DIAGNOSIS — C7801 Secondary malignant neoplasm of right lung: Secondary | ICD-10-CM

## 2014-10-25 DIAGNOSIS — T451X5A Adverse effect of antineoplastic and immunosuppressive drugs, initial encounter: Secondary | ICD-10-CM

## 2014-10-25 LAB — CBC WITH DIFFERENTIAL/PLATELET
BASO%: 0.2 % (ref 0.0–2.0)
Basophils Absolute: 0 10*3/uL (ref 0.0–0.1)
EOS ABS: 0.1 10*3/uL (ref 0.0–0.5)
EOS%: 1.2 % (ref 0.0–7.0)
HCT: 43.4 % (ref 38.4–49.9)
HGB: 14.4 g/dL (ref 13.0–17.1)
LYMPH%: 16 % (ref 14.0–49.0)
MCH: 29.4 pg (ref 27.2–33.4)
MCHC: 33.2 g/dL (ref 32.0–36.0)
MCV: 88.8 fL (ref 79.3–98.0)
MONO#: 0.5 10*3/uL (ref 0.1–0.9)
MONO%: 9.7 % (ref 0.0–14.0)
NEUT#: 3.5 10*3/uL (ref 1.5–6.5)
NEUT%: 72.9 % (ref 39.0–75.0)
PLATELETS: 206 10*3/uL (ref 140–400)
RBC: 4.89 10*6/uL (ref 4.20–5.82)
RDW: 13.5 % (ref 11.0–14.6)
WBC: 4.9 10*3/uL (ref 4.0–10.3)
lymph#: 0.8 10*3/uL — ABNORMAL LOW (ref 0.9–3.3)

## 2014-10-25 LAB — COMPREHENSIVE METABOLIC PANEL (CC13)
ALT: 18 U/L (ref 0–55)
AST: 26 U/L (ref 5–34)
Albumin: 4.9 g/dL (ref 3.5–5.0)
Alkaline Phosphatase: 58 U/L (ref 40–150)
Anion Gap: 11 mEq/L (ref 3–11)
BUN: 13.8 mg/dL (ref 7.0–26.0)
CO2: 27 meq/L (ref 22–29)
Calcium: 9.8 mg/dL (ref 8.4–10.4)
Chloride: 103 mEq/L (ref 98–109)
Creatinine: 1.1 mg/dL (ref 0.7–1.3)
EGFR: >90 mL/min/{1.73_m2} (ref >90)
Glucose: 85 mg/dl (ref 70–140)
POTASSIUM: 3.7 meq/L (ref 3.5–5.1)
Sodium: 141 mEq/L (ref 136–145)
Total Bilirubin: 0.39 mg/dL (ref 0.20–1.20)
Total Protein: 8 g/dL (ref 6.4–8.3)

## 2014-10-25 LAB — TSH CHCC: TSH: 14.02 m[IU]/L — AB (ref 0.320–4.118)

## 2014-10-25 MED ORDER — LEVOTHYROXINE SODIUM 75 MCG PO TABS: 75.0000 ug | ORAL_TABLET | Freq: Every day | ORAL | Status: DC

## 2014-10-25 MED ORDER — OXYCODONE-ACETAMINOPHEN 5-325 MG PO TABS: 1.0000 | ORAL_TABLET | Freq: Three times a day (TID) | ORAL | Status: DC | PRN

## 2014-10-25 NOTE — Assessment & Plan Note (Signed)
His hypothyroidism is suboptimally treated. I recommend increasing the dose of levothyroxine to 75 g. I will recheck it again in 3 months.

## 2014-10-25 NOTE — Telephone Encounter (Signed)
Informed pt of new Rx for Synthroid dose increased to 75 mcg daily.  Rx sent to [facility].  He verbalized understanding.

## 2014-10-25 NOTE — Assessment & Plan Note (Signed)
Overall, he has stable disease on imaging. The patient does not require any further treatment. He is asymptomatic. I reassured the patient. I plan to see him back in 3 months with repeat history, physical examination, blood work and imaging study.

## 2014-10-25 NOTE — Progress Notes (Signed)
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 Pan-endoscopy elsewhere showed cancer from nasopharyngeal space.   01/04/2007 - 02/20/2007 Chemotherapy He received 2 cycles of cisplatin and 5FU followed by concurrent chemo with weekly cisplatin and radiation. He only received 2 doses of chemo due to severe mucositis, nausea and weight loss.   04/05/2007 - 08/04/2007 Chemotherapy He received 4 more courses of cisplatin with 5FU and had complete response   07/05/2009 Procedure Fine-needle aspirate of the right level II lymph nodes come from recurrent metastatic disease. Repeat endoscopy and CT scan show no evidence of disease elsewhere.   07/08/2009 - 12/02/2009 Chemotherapy He was given 6 cycles of carboplatin, 5-FU and docetaxel   12/03/2009 Surgery He has surgery to the residual lymph node on the right neck which showed no evidence of disease.   02/22/2012 Imaging Repeat imaging study showed large recurrent mass. He was referred elsewhere for further treatment.   05/03/2012 Surgery He underwent left upper lobectomy.   04/29/2013 Imaging PEt scan showed lesion on right level II B and lower lung was abnormal   06/03/2013 - 02/02/2014 Chemotherapy He had 6 cycles of chemotherapy  when he was found to have recurrence of cancer and had received oxaliplatin and capecitabine   06/07/2014 Imaging PET CT scan showed persistent disease in the right neck lymph nodes and left lung   07/18/2014 - 07/31/2014 Radiation Therapy He received palliative radiation therapy to the lungs   10/10/2014 Imaging CT scan of the chest, abdomen and pelvis show regression in the size of the lung nodule in the left upper lobe and stable pulmonary nodules    INTERVAL HISTORY: Please see below for problem oriented charting. He returns for further supportive care visit. He complained of incisional pain around his neck and is dependent on taking Percocet. He denies new lymphadenopathy. No new cough.  REVIEW OF SYSTEMS:   Constitutional: Denies fevers, chills or abnormal weight loss Eyes: Denies blurriness of vision Ears, nose, mouth, throat, and face: Denies mucositis or sore throat Respiratory: Denies cough, dyspnea or wheezes Cardiovascular: Denies palpitation, chest discomfort or lower extremity swelling Gastrointestinal:  Denies nausea, heartburn or change in bowel habits Skin: Denies abnormal skin rashes Lymphatics: Denies new lymphadenopathy or easy bruising Neurological:Denies numbness, tingling or new weaknesses Behavioral/Psych: Mood is stable, no new changes  All other systems were reviewed with the patient and are negative.  I have reviewed the past medical history, past surgical history, social history and family history with the patient and they are unchanged from previous note.  ALLERGIES:  has No Known Allergies.  MEDICATIONS:  Current Outpatient Prescriptions  Medication Sig Dispense Refill  . levothyroxine (SYNTHROID) 50 MCG tablet Take 1 tablet (50 mcg total) by mouth daily before breakfast. 30  tablet 4  . oxyCODONE-acetaminophen (PERCOCET/ROXICET) 5-325 MG per tablet Take 1 tablet by mouth every 8 (eight) hours as needed for severe pain. 90 tablet 0   No current  facility-administered medications for this visit.    PHYSICAL EXAMINATION: ECOG PERFORMANCE STATUS: 0 - Asymptomatic  Filed Vitals:   10/25/14 1318  BP: 123/71  Pulse: 70  Temp: 98.1 F (36.7 C)  Resp: 20   Filed Weights   10/25/14 1318  Weight: 160 lb 11.2 oz (72.893 kg)    GENERAL:alert, no distress and comfortable SKIN: skin color, texture, turgor are normal, no rashes or significant lesions EYES: normal, Conjunctiva are pink and non-injected, sclera clear OROPHARYNX:no exudate, no erythema and lips, buccal mucosa, and tongue normal  NECK: Well-healed surgical scar with mild neck fibrosis LYMPH:  no palpable lymphadenopathy in the cervical, axillary or inguinal LUNGS: clear to auscultation and percussion with normal breathing effort HEART: regular rate & rhythm and no murmurs and no lower extremity edema ABDOMEN:abdomen soft, non-tender and normal bowel sounds Musculoskeletal:no cyanosis of digits and no clubbing  NEURO: alert & oriented x 3 with fluent speech, no focal motor/sensory deficits  LABORATORY DATA:  I have reviewed the data as listed    Component Value Date/Time   NA 141 10/25/2014 1303   NA 141 06/29/2014 0655   K 3.7 10/25/2014 1303   K 4.4 06/29/2014 0655   CL 104 06/29/2014 0655   CO2 27 10/25/2014 1303   CO2 28 06/29/2014 0655   GLUCOSE 85 10/25/2014 1303   GLUCOSE 95 06/29/2014 0655   BUN 13.8 10/25/2014 1303   BUN 19 06/29/2014 0655   CREATININE 1.1 10/25/2014 1303   CREATININE 0.97 06/29/2014 0655   CALCIUM 9.8 10/25/2014 1303   CALCIUM 9.4 06/29/2014 0655   PROT 8.0 10/25/2014 1303   PROT 7.1 06/29/2014 0655   ALBUMIN 4.9 10/25/2014 1303   ALBUMIN 4.1 06/29/2014 0655   AST 26 10/25/2014 1303   AST 29 06/29/2014 0655   ALT 18 10/25/2014 1303   ALT 24 06/29/2014 0655   ALKPHOS 58 10/25/2014 1303   ALKPHOS 47 06/29/2014 0655   BILITOT 0.39 10/25/2014 1303   BILITOT 0.3 06/29/2014 0655   GFRNONAA >90 06/29/2014 0655   GFRAA >90  06/29/2014 0655    No results found for: SPEP, UPEP  Lab Results  Component Value Date   WBC 4.9 10/25/2014   NEUTROABS 3.5 10/25/2014   HGB 14.4 10/25/2014   HCT 43.4 10/25/2014   MCV 88.8 10/25/2014   PLT 206 10/25/2014      Chemistry      Component Value Date/Time   NA 141 10/25/2014 1303   NA 141 06/29/2014 0655   K 3.7 10/25/2014 1303   K 4.4 06/29/2014 0655   CL 104 06/29/2014 0655   CO2 27 10/25/2014 1303   CO2 28 06/29/2014 0655   BUN 13.8 10/25/2014 1303   BUN 19 06/29/2014 0655   CREATININE 1.1 10/25/2014 1303   CREATININE 0.97 06/29/2014 0655      Component Value Date/Time   CALCIUM 9.8 10/25/2014 1303   CALCIUM 9.4 06/29/2014 0655   ALKPHOS 58 10/25/2014 1303   ALKPHOS 47 06/29/2014 0655   AST 26 10/25/2014 1303   AST 29 06/29/2014 0655   ALT 18 10/25/2014 1303   ALT 24 06/29/2014 0655   BILITOT 0.39 10/25/2014 1303   BILITOT 0.3 06/29/2014 0655       RADIOGRAPHIC STUDIES: I reviewed the last imaging study with him I  have personally reviewed the radiological images as listed and agreed with the findings in the report.  ASSESSMENT & PLAN:  Metastasis to lung Overall, he has stable disease on imaging. The patient does not require any further treatment. He is asymptomatic. I reassured the patient. I plan to see him back in 3 months with repeat history, physical examination, blood work and imaging study.   Hypothyroidism His hypothyroidism is suboptimally treated. I recommend increasing the dose of levothyroxine to 75 g. I will recheck it again in 3 months.   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 We discussed about potential treatment with gabapentin but he declined. I refill his prescription pain medication today. I warned him about side effects and we discussed about narcotic refill policy    Orders Placed This Encounter  Procedures  . CT Chest W Contrast     Standing Status: Future     Number of Occurrences:      Standing Expiration Date: 12/25/2015    Order Specific Question:  Reason for Exam (SYMPTOM  OR DIAGNOSIS REQUIRED)    Answer:  staging nasophryngeal ca, exclude recurrence    Order Specific Question:  Preferred imaging location?    Answer:  [facility]  . CT Soft Tissue Neck W Contrast    Standing Status: Future     Number of Occurrences:      Standing Expiration Date: 01/25/2016    Order Specific Question:  Reason for Exam (SYMPTOM  OR DIAGNOSIS REQUIRED)    Answer:  staging nasophryngeal ca, exclude recurrence    Order Specific Question:  Preferred imaging location?    Answer:  [facility]  . CBC with Differential    Standing Status: Future     Number of Occurrences:      Standing Expiration Date: 11/29/2015  . Comprehensive metabolic panel    Standing Status: Future     Number of Occurrences:      Standing Expiration Date: 11/29/2015   All questions were answered. The patient knows to call the clinic with any problems, questions or concerns. No barriers to learning was detected. I spent 25 minutes counseling the patient face to face. The total time spent in the appointment was 30 minutes and more than 50% was on counseling and review of test results     [facility], [place], MD 10/25/2014 3:42 PM

## 2014-10-25 NOTE — Telephone Encounter (Signed)
Pt confirmed labs/ov per 01/21 POF, gave pt AVS... KJ

## 2014-10-25 NOTE — Telephone Encounter (Signed)
-----   Message from [name], MD sent at 10/25/2014  3:00 PM EST ----- Regarding: low thyroid Pls call him that he needs to increase his thyroid supplement to 75 mcg (1.5 tablets). Please send a new prescription to him pharmacy 75 mcg daily 30 tablets 2 refills. I will recheck this next visit ----- Message -----    From: [facility]    Sent: 10/25/2014   1:18 PM      To: [name], MD

## 2014-10-25 NOTE — Assessment & Plan Note (Signed)
He had neuropathy from chemotherapy and postsurgical incisional pain in his neck. We discussed about potential treatment with gabapentin but he declined. I refill his prescription pain medication today. I warned him about side effects and we discussed about narcotic refill policy

## 2014-10-26 NOTE — Progress Notes (Signed)
Met with patient after his appt with Dr. Alvy Bimler.  He provided me an update on status of his cancer and treatments.  He understands he can contact me with concerns/needs.  Gayleen Orem, RN, BSN, [place] [facility] [phone]

## 2014-11-02 ENCOUNTER — Encounter: Payer: Self-pay | Admitting: Hematology and Oncology

## 2014-11-02 ENCOUNTER — Telehealth: Payer: Self-pay | Admitting: *Deleted

## 2014-11-02 NOTE — Telephone Encounter (Signed)
Pt left VM states he is trying to apply for Social Security Disability benefits.  He says they asked him "the stage" of his cancer and he does not know this. They recommended he ask MD to write a brief letter explaining his diagnosis, staging, treatment and overall condition to present for benefits.

## 2014-11-02 NOTE — Telephone Encounter (Signed)
Informed pt of letter ready to pick up.  He verbalized understanding.

## 2014-11-29 ENCOUNTER — Other Ambulatory Visit: Payer: Self-pay | Admitting: Hematology and Oncology

## 2014-12-06 ENCOUNTER — Other Ambulatory Visit: Payer: Self-pay | Admitting: Hematology and Oncology

## 2014-12-06 ENCOUNTER — Telehealth: Payer: Self-pay | Admitting: *Deleted

## 2014-12-06 DIAGNOSIS — C119 Malignant neoplasm of nasopharynx, unspecified: Secondary | ICD-10-CM

## 2014-12-06 MED ORDER — OXYCODONE-ACETAMINOPHEN 5-325 MG PO TABS: 1.0000 | ORAL_TABLET | Freq: Three times a day (TID) | ORAL | Status: DC | PRN

## 2014-12-06 NOTE — Telephone Encounter (Signed)
Message received from [facility] pt requesting refill on his pain medication.   Called pt and he needs refill on Percocet.  He reports he has had some type of stomach bug with n/v/d last week.  He has been feeling better for the past 3 days.  Called pt back and left VM informing him of Rx ready to pick up.  Also instructed him to call his PCP if his GI symptoms return.  Call us back if any questions or concerns.

## 2014-12-06 NOTE — Telephone Encounter (Signed)
Pt called nurse back and I informed him of Rx percocet ready to pick up.  He verbalized understanding.

## 2015-01-24 ENCOUNTER — Encounter (HOSPITAL_COMMUNITY): Payer: Self-pay

## 2015-01-24 ENCOUNTER — Other Ambulatory Visit (HOSPITAL_BASED_OUTPATIENT_CLINIC_OR_DEPARTMENT_OTHER): Payer: Medicaid Other

## 2015-01-24 ENCOUNTER — Ambulatory Visit (HOSPITAL_COMMUNITY)
Admission: RE | Admit: 2015-01-24 | Discharge: 2015-01-24 | Disposition: A | Discharge status: Home / Self Care | Payer: Medicaid Other | Source: Ambulatory Visit | Attending: Hematology and Oncology | Admitting: Hematology and Oncology

## 2015-01-24 DIAGNOSIS — C119 Malignant neoplasm of nasopharynx, unspecified: Secondary | ICD-10-CM | POA: Diagnosis present

## 2015-01-24 DIAGNOSIS — E038 Other specified hypothyroidism: Secondary | ICD-10-CM

## 2015-01-24 DIAGNOSIS — C7800 Secondary malignant neoplasm of unspecified lung: Secondary | ICD-10-CM | POA: Diagnosis not present

## 2015-01-24 LAB — CBC WITH DIFFERENTIAL/PLATELET
BASO%: 0.4 % (ref 0.0–2.0)
BASOS ABS: 0 10*3/uL (ref 0.0–0.1)
EOS ABS: 0.1 10*3/uL (ref 0.0–0.5)
EOS%: 1.6 % (ref 0.0–7.0)
HEMATOCRIT: 43.8 % (ref 38.4–49.9)
HGB: 14.9 g/dL (ref 13.0–17.1)
LYMPH%: 17.5 % (ref 14.0–49.0)
MCH: 30.2 pg (ref 27.2–33.4)
MCHC: 34 g/dL (ref 32.0–36.0)
MCV: 88.8 fL (ref 79.3–98.0)
MONO#: 0.4 10*3/uL (ref 0.1–0.9)
MONO%: 8 % (ref 0.0–14.0)
NEUT#: 3.7 10*3/uL (ref 1.5–6.5)
NEUT%: 72.5 % (ref 39.0–75.0)
Platelets: 192 10*3/uL (ref 140–400)
RBC: 4.93 10*6/uL (ref 4.20–5.82)
RDW: 13.6 % (ref 11.0–14.6)
WBC: 5.2 10*3/uL (ref 4.0–10.3)
lymph#: 0.9 10*3/uL (ref 0.9–3.3)

## 2015-01-24 LAB — COMPREHENSIVE METABOLIC PANEL (CC13)
ALBUMIN: 4.3 g/dL (ref 3.5–5.0)
ALT: 32 U/L (ref 0–55)
ANION GAP: 14 meq/L — AB (ref 3–11)
AST: 27 U/L (ref 5–34)
Alkaline Phosphatase: 54 U/L (ref 40–150)
BUN: 14.7 mg/dL (ref 7.0–26.0)
CALCIUM: 9.6 mg/dL (ref 8.4–10.4)
CHLORIDE: 106 meq/L (ref 98–109)
CO2: 23 mEq/L (ref 22–29)
CREATININE: 1 mg/dL (ref 0.7–1.3)
Glucose: 98 mg/dl (ref 70–140)
Potassium: 4 mEq/L (ref 3.5–5.1)
SODIUM: 142 meq/L (ref 136–145)
Total Bilirubin: 0.42 mg/dL (ref 0.20–1.20)
Total Protein: 7.3 g/dL (ref 6.4–8.3)

## 2015-01-24 LAB — TSH CHCC: TSH: 5.732 m(IU)/L — ABNORMAL HIGH (ref 0.320–4.118)

## 2015-01-24 MED ORDER — IOHEXOL 300 MG/ML  SOLN
100.0000 mL | Freq: Once | INTRAMUSCULAR | Status: AC | PRN
  Administered 2015-01-24: 100 mL via INTRAVENOUS

## 2015-01-28 ENCOUNTER — Ambulatory Visit (HOSPITAL_BASED_OUTPATIENT_CLINIC_OR_DEPARTMENT_OTHER): Payer: Medicaid Other | Admitting: Hematology and Oncology

## 2015-01-28 ENCOUNTER — Encounter: Payer: Self-pay | Admitting: Hematology and Oncology

## 2015-01-28 ENCOUNTER — Telehealth: Payer: Self-pay | Admitting: Hematology and Oncology

## 2015-01-28 VITALS — BP 111/59 | HR 81 | Temp 98.2°F | Resp 19 | Ht 66.0 in | Wt 160.1 lb

## 2015-01-28 DIAGNOSIS — C7802 Secondary malignant neoplasm of left lung: Secondary | ICD-10-CM | POA: Diagnosis not present

## 2015-01-28 DIAGNOSIS — M542 Cervicalgia: Secondary | ICD-10-CM | POA: Diagnosis not present

## 2015-01-28 DIAGNOSIS — E038 Other specified hypothyroidism: Secondary | ICD-10-CM

## 2015-01-28 DIAGNOSIS — C119 Malignant neoplasm of nasopharynx, unspecified: Secondary | ICD-10-CM

## 2015-01-28 DIAGNOSIS — E039 Hypothyroidism, unspecified: Secondary | ICD-10-CM

## 2015-01-28 MED ORDER — LIDOCAINE 5 % EX PTCH: 1.0000 | MEDICATED_PATCH | CUTANEOUS | Status: DC

## 2015-01-28 MED ORDER — LEVOTHYROXINE SODIUM 75 MCG PO TABS: ORAL_TABLET | ORAL | Status: DC

## 2015-01-28 MED ORDER — OXYCODONE-ACETAMINOPHEN 5-325 MG PO TABS: 1.0000 | ORAL_TABLET | Freq: Three times a day (TID) | ORAL | Status: DC | PRN

## 2015-01-28 NOTE — Assessment & Plan Note (Signed)
He has significant right neck pain at prior surgical sites. He is dependent on pain medicine. I'll refill his prescription, added lidocaine patch and recommend physical therapy. CT scan did not show any significant changes to suggest disease progression

## 2015-01-28 NOTE — Assessment & Plan Note (Signed)
Overall, he has stable disease on imaging. The patient does not require any further treatment. He is asymptomatic. I reassured the patient. I plan to see him back in 4 months with repeat history, physical examination, blood work and imaging study.

## 2015-01-28 NOTE — Assessment & Plan Note (Signed)
He had hypothyroidism from prior surgery and radiation. He admitted that he has been somewhat noncompliant taking his thyroid supplement. I refill his prescription today but is not inclined to increase the dose. I reinforced the importance of him taking his medications regularly.

## 2015-01-28 NOTE — Assessment & Plan Note (Signed)
Overall, CT scan showed stable disease. I recommend observation with history, physical examination and imaging in 4 months

## 2015-01-28 NOTE — Progress Notes (Signed)
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 Pan-endoscopy elsewhere showed cancer from nasopharyngeal space.   01/04/2007 - 02/20/2007 Chemotherapy He received 2 cycles of cisplatin and 5FU followed by concurrent chemo with weekly cisplatin and radiation. He only received 2 doses of chemo due to severe mucositis, nausea and weight loss.   04/05/2007 - 08/04/2007 Chemotherapy He received 4 more courses of cisplatin with 5FU and had complete response   07/05/2009 Procedure Fine-needle aspirate of the right level II lymph nodes come from recurrent metastatic disease. Repeat endoscopy and CT scan show no evidence of disease elsewhere.   07/08/2009 - 12/02/2009 Chemotherapy He was given 6 cycles of carboplatin, 5-FU and docetaxel   12/03/2009 Surgery He has surgery to the residual lymph node on the right neck which showed no evidence of disease.   02/22/2012 Imaging Repeat imaging study showed large recurrent mass. He was referred elsewhere for further treatment.   05/03/2012 Surgery He underwent left upper lobectomy.   04/29/2013 Imaging PEt scan showed lesion on right level II B and lower lung was abnormal   06/03/2013 - 02/02/2014 Chemotherapy He had 6 cycles of chemotherapy when  he was found to have recurrence of cancer and had received oxaliplatin and capecitabine   06/07/2014 Imaging PET CT scan showed persistent disease in the right neck lymph nodes and left lung   07/18/2014 - 07/31/2014 Radiation Therapy He received palliative radiation therapy to the lungs   10/10/2014 Imaging CT scan of the chest, abdomen and pelvis show regression in the size of the lung nodule in the left upper lobe and stable pulmonary nodules   01/24/2015 Imaging CT scan showed stable disease in neck and lung    INTERVAL HISTORY: Please see below for problem oriented charting. He is in today to review test results. His only complaint is persistent neck pain in the right side of the neck just underneath the angle of the right jaw. He does not like taking pain medication as it caused excessive sedation. He denies new lymphadenopathy. No recent infection.  REVIEW OF SYSTEMS:   Constitutional: Denies fevers, chills or abnormal weight loss Eyes: Denies blurriness of vision Ears, nose, mouth, throat, and face: Denies mucositis or sore throat Respiratory: Denies cough, dyspnea or wheezes Cardiovascular: Denies palpitation, chest discomfort or lower extremity swelling Gastrointestinal:  Denies nausea, heartburn or change in bowel habits Skin: Denies abnormal skin rashes Lymphatics: Denies new lymphadenopathy or easy bruising Neurological:Denies numbness, tingling or new weaknesses Behavioral/Psych: Mood is stable, no new changes  All other systems were reviewed with the patient and are negative.  I have reviewed the past medical history, past surgical history, social history and family history with the patient and they are unchanged from previous note.  ALLERGIES:  has No Known Allergies.  MEDICATIONS:  Current Outpatient Prescriptions  Medication Sig Dispense Refill  . levothyroxine (SYNTHROID, LEVOTHROID) 75 MCG tablet TAKE 1 TABLET BY MOUTH DAILY BEFORE BREAKFAST 90 tablet 2  .  oxyCODONE-acetaminophen (PERCOCET/ROXICET) 5-325 MG per tablet Take 1 tablet by mouth every 8 (eight) hours as needed for severe pain. 90 tablet 0  . lidocaine (LIDODERM) 5 % Place 1 patch onto the skin daily. Remove & Discard patch within 12 hours or as directed by MD 30 patch 6   No current facility-administered medications for this visit.    PHYSICAL EXAMINATION: ECOG PERFORMANCE STATUS: 1 - Symptomatic but completely ambulatory  Filed Vitals:   01/28/15 1351  BP: 111/59  Pulse: 81  Temp: 98.2 F (36.8 C)  Resp: 19   Filed Weights   01/28/15 1351  Weight: 160 lb 1.6 oz (72.621 kg)    GENERAL:alert, no distress and comfortable SKIN: skin color, texture, turgor are normal, no rashes or significant lesions EYES: normal, Conjunctiva are pink and non-injected, sclera clear OROPHARYNX:no exudate, no erythema and lips, buccal mucosa, and tongue normal  NECK: He has significant pain in the right side of the neck coinciding with prior surgical incision site. Neck is fibrosed from prior treatment. LYMPH:  no palpable lymphadenopathy in the cervical, axillary or inguinal LUNGS: clear to auscultation and percussion with normal breathing effort HEART: regular rate & rhythm and no murmurs and no lower extremity edema ABDOMEN:abdomen soft, non-tender and normal bowel sounds Musculoskeletal:no cyanosis of digits and no clubbing  NEURO: alert & oriented x 3 with fluent speech, no focal motor/sensory deficits  LABORATORY DATA:  I have reviewed the data as listed    Component Value Date/Time   NA 142 01/24/2015 0950   NA 141 06/29/2014 0655   K 4.0 01/24/2015 0950   K 4.4 06/29/2014 0655   CL 104 06/29/2014 0655   CO2 23 01/24/2015 0950   CO2 28 06/29/2014 0655   GLUCOSE 98 01/24/2015 0950   GLUCOSE 95 06/29/2014 0655   BUN 14.7 01/24/2015 0950   BUN 19 06/29/2014 0655   CREATININE 1.0 01/24/2015 0950   CREATININE 0.97 06/29/2014 0655   CALCIUM 9.6 01/24/2015 0950   CALCIUM 9.4  06/29/2014 0655   PROT 7.3 01/24/2015 0950   PROT 7.1 06/29/2014 0655   ALBUMIN 4.3 01/24/2015 0950   ALBUMIN 4.1 06/29/2014 0655   AST 27 01/24/2015 0950   AST 29 06/29/2014 0655   ALT 32 01/24/2015 0950   ALT 24 06/29/2014 0655   ALKPHOS 54 01/24/2015 0950   ALKPHOS 47 06/29/2014 0655   BILITOT 0.42 01/24/2015 0950   BILITOT 0.3 06/29/2014 0655   GFRNONAA >90 06/29/2014 0655   GFRAA >90 06/29/2014 0655    No results found for: SPEP, UPEP  Lab Results  Component Value Date   WBC 5.2 01/24/2015   NEUTROABS 3.7 01/24/2015   HGB 14.9 01/24/2015   HCT 43.8 01/24/2015   MCV 88.8 01/24/2015   PLT 192 01/24/2015      Chemistry      Component Value Date/Time   NA 142 01/24/2015 0950   NA 141 06/29/2014 0655   K 4.0 01/24/2015 0950   K 4.4 06/29/2014 0655   CL 104 06/29/2014 0655   CO2 23 01/24/2015 0950   CO2 28 06/29/2014 0655   BUN 14.7 01/24/2015 0950   BUN 19 06/29/2014 0655   CREATININE 1.0 01/24/2015 0950   CREATININE 0.97 06/29/2014 0655      Component  Value Date/Time   CALCIUM 9.6 01/24/2015 0950   CALCIUM 9.4 06/29/2014 0655   ALKPHOS 54 01/24/2015 0950   ALKPHOS 47 06/29/2014 0655   AST 27 01/24/2015 0950   AST 29 06/29/2014 0655   ALT 32 01/24/2015 0950   ALT 24 06/29/2014 0655   BILITOT 0.42 01/24/2015 0950   BILITOT 0.3 06/29/2014 0655       RADIOGRAPHIC STUDIES: I have reviewed his CT scan of the neck and the chest with the patient. I have personally reviewed the radiological images as listed and agreed with the findings in the report.   ASSESSMENT & PLAN:  Metastasis to lung Overall, he has stable disease on imaging. The patient does not require any further treatment. He is asymptomatic. I reassured the patient. I plan to see him back in 4 months with repeat history, physical examination, blood work and imaging study.   Nasopharyngeal cancer Overall, CT scan showed stable disease. I recommend observation with history, physical  examination and imaging in 4 months   Neck pain on right side He has significant right neck pain at prior surgical sites. He is dependent on pain medicine. I'll refill his prescription, added lidocaine patch and recommend physical therapy. CT scan did not show any significant changes to suggest disease progression   Hypothyroidism He had hypothyroidism from prior surgery and radiation. He admitted that he has been somewhat noncompliant taking his thyroid supplement. I refill his prescription today but is not inclined to increase the dose. I reinforced the importance of him taking his medications regularly.    Orders Placed This Encounter  Procedures  . CT Chest W Contrast    Standing Status: Future     Number of Occurrences:      Standing Expiration Date: 04/29/2016    Order Specific Question:  Reason for Exam (SYMPTOM  OR DIAGNOSIS REQUIRED)    Answer:  staging nasopharyngeal ca    Order Specific Question:  Preferred imaging location?    Answer:  [facility]  . CT Soft Tissue Neck W Contrast    Standing Status: Future     Number of Occurrences:      Standing Expiration Date: 04/29/2016    Order Specific Question:  Reason for Exam (SYMPTOM  OR DIAGNOSIS REQUIRED)    Answer:  staging nasopharyngeal ca    Order Specific Question:  Preferred imaging location?    Answer:  [facility]  . CBC with Differential/Platelet    Standing Status: Future     Number of Occurrences:      Standing Expiration Date: 04/29/2016  . Comprehensive metabolic panel    Standing Status: Future     Number of Occurrences:      Standing Expiration Date: 04/29/2016  . TSH    Standing Status: Future     Number of Occurrences:      Standing Expiration Date: 04/29/2016  . Ambulatory referral to Physical Therapy    Referral Priority:  Routine    Referral Type:  Physical Medicine    Referral Reason:  Specialty Services Required    Requested Specialty:  Physical Therapy    Number of  Visits Requested:  1   All questions were answered. The patient knows to call the clinic with any problems, questions or concerns. No barriers to learning was detected. I spent 25 minutes counseling the patient face to face. The total time spent in the appointment was 30 minutes and more than 50% was on counseling and review of test  results     [place], [place], MD 01/28/2015 2:07 PM

## 2015-01-28 NOTE — Telephone Encounter (Signed)
Gave avs & calenedar for August.

## 2015-01-31 ENCOUNTER — Encounter: Payer: Self-pay | Admitting: Hematology and Oncology

## 2015-01-31 NOTE — Progress Notes (Signed)
I faxed prior auth form to  [phone] for lidocaine

## 2015-01-31 NOTE — Progress Notes (Signed)
I placed form for prior auth for lidocaine patch on desk of nurse for dr. Alvy Bimler

## 2015-02-05 ENCOUNTER — Encounter: Payer: Self-pay | Admitting: Hematology and Oncology

## 2015-02-05 ENCOUNTER — Telehealth: Payer: Self-pay | Admitting: *Deleted

## 2015-02-05 ENCOUNTER — Ambulatory Visit: Payer: Medicaid Other | Admitting: Physical Therapy

## 2015-02-05 ENCOUNTER — Ambulatory Visit: Payer: Medicaid Other | Attending: Hematology and Oncology | Admitting: Physical Therapy

## 2015-02-05 DIAGNOSIS — M542 Cervicalgia: Secondary | ICD-10-CM | POA: Diagnosis not present

## 2015-02-05 DIAGNOSIS — M25611 Stiffness of right shoulder, not elsewhere classified: Secondary | ICD-10-CM

## 2015-02-05 DIAGNOSIS — M436 Torticollis: Secondary | ICD-10-CM

## 2015-02-05 DIAGNOSIS — G8929 Other chronic pain: Secondary | ICD-10-CM | POA: Insufficient documentation

## 2015-02-05 MED ORDER — DICLOFENAC SODIUM 1 % TD GEL: 2.0000 g | Freq: Two times a day (BID) | TRANSDERMAL | Status: DC | PRN

## 2015-02-05 NOTE — Telephone Encounter (Signed)
Informed pt of Voltaren gel ordered in place of lidoderm patches due to insurance.  He verbalized understanding.

## 2015-02-05 NOTE — Progress Notes (Signed)
Per Abigail Butts --nctracks prior auth for Lidocaine patch is denied. He has to try the voltaren gel 1st. I will let Dr. Alvy Bimler know via staff message.

## 2015-02-05 NOTE — Telephone Encounter (Signed)
-----   Message from [name], MD sent at 02/05/2015  8:55 AM EDT ----- Regarding: RE: Lidocaine prior auth denied Thanks Raquel  Cameo, can you call in Voltaren gel apply BID largest tube, dispense 1, refill 1 Thanks ----- Message -----    From: Mariam Dollar    Sent: 02/05/2015   8:44 AM      To: Cathlean Cower, RN, [name], MD Subject: Lidocaine prior auth denied                    Hey Dr. Alvy Bimler, Medicaid has denied the Lidocaine patch for this patient. He must try the Voltaren gel 1st.  Thanks, Raquel

## 2015-02-05 NOTE — Therapy (Addendum)
[place], [place], [address] Phone: [phone]   Fax:  [phone]  Physical Therapy Evaluation  Patient Details  Name: Christian Simmons MRN: [id number] Date of Birth: 17-Sep-1986 Referring Provider:  [name], MD  Encounter Date: 02/05/2015      PT End of Session - 02/05/15 1721    Visit Number 1   Number of Visits 4   PT Start Time 1350   PT Stop Time 1430   PT Time Calculation (min) 40 min      Past Medical History  Diagnosis Date  . Neuropathy   . Fatigue 06/01/2014  . Hypothyroidism   . Radiation 07/18/14-07/31/14    Left upper lobe  40 gy in 10 fractions  . 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 Lung removal, partial  05/03/2012    left upper lobectomy  . Radical neck dissection    . Nasal biopsy    . Video bronchoscopy N/A 06/29/2014    Procedure: VIDEO BRONCHOSCOPY ;  Surgeon: Melrose Nakayama, MD;  Location: [facility];  Service: Thoracic;  Laterality: N/A;    There were no vitals filed for this visit.  Visit Diagnosis:  Neck pain of over 3 months duration  Neck stiffness  Shoulder stiffness, right      Subjective Assessment - 02/05/15 1408    Subjective patient complains of neck pain that has been alot worse the past 2 weeks  He has not been able to work because of neck pain.  He also has been having some complaints of dizziness this past week    Pertinent History surgery and radiation x2 due to nasopharyn cancer.  Scan on 01/23/2014 is ok.  Disclocated right shoulder 8 months ago with some reoccurance of pain in shoulder a couple of weeks ago.    Patient Stated Goals get rid of the neck pain and have the left neck muscle be like the right neck muscle    Currently in Pain? Yes   Pain Score 10-Worst pain ever  when he doesn't have oxycodon, with it , he isw numb   Pain Location Neck   Pain  Orientation Right   Pain Descriptors / Indicators Stabbing;Throbbing;Shooting  Itching on the whole side , bit of fever on the neck , progressive    Pain Radiating Towards toward left face.  Pt feels that when it progresses he feels twitching at face and pulls his mouth to the side.    Pain Onset More than a month ago   Aggravating Factors  cold, when he does things with his hands, when he lies on his left side, can't look up    Pain Relieving Factors oxycodone, hot packs    Effect of Pain on Daily Activities can't work, hasn't been able to go to his job.            [facility] PT Assessment - 02/05/15 0001    Assessment   Medical Diagnosis nasopharynx cancer   Precautions   Precautions --  previous radiation   Restrictions   Weight Bearing Restrictions No   Balance Screen   Has the patient fallen in the past 6 months No   Has the patient had a decrease in activity level because of a fear of falling?  No   Is the patient reluctant to leave their home because of a fear of falling?  No   Home  Environment   Living Enviornment Private residence   Prior Function   Level of Independence Independent with basic ADLs;Independent with homemaking with ambulation;Independent with gait;Independent with transfers   Cognition   Overall Cognitive Status Within Functional Limits for tasks assessed   Observation/Other Assessments   Observations visible tightness of sternocleidomastoid muscle   Skin Integrity intact   Coordination   Gross Motor Movements are Fluid and Coordinated Yes   Posture/Postural Control   Posture/Postural Control Postural limitations   Postural Limitations Rounded Shoulders;Forward head   AROM   Right Shoulder Flexion 97 Degrees   Right Shoulder ABduction 70 Degrees   Right Shoulder Internal Rotation 60 Degrees   Right Shoulder External Rotation 67 Degrees   Cervical Flexion 65   Cervical Extension 35   Cervical - Right Side Bend 35   Cervical - Left Side Bend 30    Cervical - Right Rotation 68  in supine after a hot pack   Cervical - Left Rotation 65   Palpation   Palpation extreme tightness and sensitivity to trigger points along right neck muscle that diminished some with sustained pressure                   [facility] Adult PT Treatment/Exercise - 02/05/15 0001    Moist Heat Therapy   Number Minutes Moist Heat 30 Minutes   Moist Heat Location --  neck                   Short Term Clinic Goals - 02/05/15 1735    CC Short Term Goal  #1   Title short term goals = long term goals             Long Term Clinic Goals - 02/05/15 1735    CC Long Term Goal  #1   Title Patient will be independent in a advanced home program for range of motion strength    Baseline no knowledge   Period Weeks   CC Long Term Goal  #2   Title Patient will report pain at 5/10 so he can perform daily activities with greater ease   Baseline 10/10   Time 3   Period Weeks   CC Long Term Goal  #3   Title pt will verbalize a strategy for how he will independently manage his pain at home   Baseline knowledge not effective for managing pain   Time 3   Period Weeks            Plan - 02/05/15 1725    Clinical Impression Statement pt known from previous episode. He reports he has been in severe pain and gets relief form the moist heat packs we use here, not the elecric heating pad he bought for home.  So, a hot pack was issued for symtom relief during eval and he received mucy symptomatic relief from it and was able to move his head easier.  He is distressed that he will not get encough visits to make a sustinable change at home.  He has very tender trigger points and myfascial restriction.  He may benefit from a trial of dry needling to see if he will get relief. will discuss case with out dry needling therapists. 1 He would also benefit from a trial of electic stim  and possible TENS unit.   Pt will benefit from skilled therapeutic intervention in  order to improve on the following deficits Pain;Increased fascial restricitons;Increased muscle spasms;Decreased scar mobility;Impaired UE functional use;Decreased range  of motion;Decreased strength   Rehab Potential Good   Clinical Impairments Affecting Rehab Potential previous radiation treatments to neck, prolonged fascial restriction of SCM muscle and neck are    PT Frequency 1x / week   PT Duration 3 weeks   PT Treatment/Interventions Therapeutic exercise;ADLs/Self Care Home Management;Scar mobilization;Passive range of motion;Dry needling;Patient/family education;Electrical Stimulation;Moist Heat;Manual techniques;Therapeutic activities   PT Next Visit Plan hot pack, myofascial release and soft tissue work to neck, upper traps and SCM.exercise and teach self strethcing Consider  trial  electic stim at posterior neck /talk with dry needling therapists   Consulted and Agree with Plan of Care Patient         Problem List Patient Active Problem List   Diagnosis Date Noted  . Neck pain on right side 01/28/2015  . 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 Hypothyroidism 06/09/2014  . 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 Fatigue 06/01/2014   Donato Heinz. Owens Shark, PT  02/05/2015, 5:39 PM     PHYSICAL THERAPY DISCHARGE SUMMARY  Visits from Start of Care: 1  Current functional level related to goals / functional outcomes: Unknown as insurance did not approve return visits   Remaining deficits: As above    Education / Equipment: none Plan: Patient agrees to discharge.  Patient goals were not met. Patient is being discharged due to not returning since the last visit.  ?????          Maudry Diego, PT 11/14/2015 11:43 AM   [place], [place], [address] Phone: [phone]   Fax:  [phone]

## 2015-02-06 ENCOUNTER — Telehealth: Payer: Self-pay | Admitting: Hematology and Oncology

## 2015-02-06 NOTE — Telephone Encounter (Signed)
Faxed  pt medical records to [facility] [phone]

## 2015-02-11 NOTE — Addendum Note (Signed)
Encounter addended by: Harlene Ramus on: 02/11/2015  3:36 PM<BR>     Documentation filed: Charges VN

## 2015-02-18 ENCOUNTER — Telehealth: Payer: Self-pay | Admitting: Physical Therapy

## 2015-02-18 NOTE — Telephone Encounter (Signed)
Talked to pt and gave him the number to [facility]

## 2015-04-11 ENCOUNTER — Ambulatory Visit: Admission: RE | Admit: 2015-04-11 | Payer: Medicaid Other | Source: Ambulatory Visit | Admitting: Radiation Oncology

## 2015-04-18 ENCOUNTER — Ambulatory Visit: Admission: RE | Admit: 2015-04-18 | Payer: Medicaid Other | Source: Ambulatory Visit | Admitting: Radiation Oncology

## 2015-04-24 ENCOUNTER — Ambulatory Visit
Admission: RE | Admit: 2015-04-24 | Discharge: 2015-04-24 | Disposition: A | Discharge status: Home / Self Care | Payer: Medicaid Other | Source: Ambulatory Visit | Attending: Radiation Oncology | Admitting: Radiation Oncology

## 2015-04-24 VITALS — BP 110/55 | HR 75 | Temp 98.5°F | Wt 167.4 lb

## 2015-04-24 DIAGNOSIS — C7800 Secondary malignant neoplasm of unspecified lung: Secondary | ICD-10-CM

## 2015-04-24 NOTE — Progress Notes (Signed)
   Department of Radiation Oncology  Phone:  [phone] Fax:        [phone]   Name: Christian Simmons MRN: [id number]  DOB: 1986-02-03  Date: 04/24/2015  Follow Up Visit Note  Diagnosis: Nasopharyngeal cancer   Staging form: Pharynx - Nasopharynx, AJCC 7th Edition     Clinical: Stage IVC (T3, N2, M1) - Signed by [name], MD on 06/03/2014  Summary and Interval since last radiation: The patient received radiation treatments in 2015.   Interval History: Adriene presents today for routine follow up in regards to his history of nasopharyngeal cancer. He is still struggling with right neck pain. He takes maybe 1 oxycodone 1 time a day which somewhat relieves his pain but causes him to feel "high" so he can't take it at work. He is not able to make himself do the exercises at home that PT showed him. He states the best thing for the pain was the heat they used at PT. He is accompanied by his son today who is 61 months old. His wife is pregnant again. He is studying to become a radiation therapist. His recent CT shows good response of the lung tumor and no new areas of disease.   Physical Exam:  Filed Vitals:   04/24/15 1637  BP: 110/55  Pulse: 75  Temp: 98.5 F (36.9 C)  Weight: 167 lb 6.4 oz (75.932 kg)   Pleasant male. No distress. Palpable fibrosis and decreased range of motion of the neck. No change in physical findings.  Alert and oriented.   IMPRESSION: Mareo is a 29 y.o. male presenting to clinic in regards to his nasopharyngeal cancer currently NED with residual right neck pain and fibrosis  PLAN:  I talked to him at length. We talked about referral to pain management or to interventionalist for nerve block. He ultimately gave me permission to contact my yoga studio as I talked to him about gentle stretching yoga with heat. I gave him the address and location and encouraged him to follow up there. He will be seen in follow up by Dr. Alvy Bimler. I will see him back prn.     This document serves as a record of services personally performed by Thea Silversmith , MD. It was created on her behalf by Lenn Cal, a trained medical scribe. The creation of this record is based on the scribe's personal observations and the provider's statements to them. This document has been checked and approved by the attending provider.   _______________________________________  Thea Silversmith, MD

## 2015-05-03 ENCOUNTER — Other Ambulatory Visit: Payer: Self-pay | Admitting: Hematology and Oncology

## 2015-05-03 ENCOUNTER — Telehealth: Payer: Self-pay

## 2015-05-03 DIAGNOSIS — C119 Malignant neoplasm of nasopharynx, unspecified: Secondary | ICD-10-CM

## 2015-05-03 MED ORDER — OXYCODONE-ACETAMINOPHEN 5-325 MG PO TABS: 1.0000 | ORAL_TABLET | Freq: Three times a day (TID) | ORAL | Status: DC | PRN

## 2015-05-03 NOTE — Telephone Encounter (Signed)
Pt calling for refill on his pain medication.

## 2015-05-03 NOTE — Telephone Encounter (Signed)
Ready for pick up

## 2015-05-03 NOTE — Telephone Encounter (Signed)
lvm prescription is ready for pick up. Try to get to [facility] by 4 pm, bring drivers liscence.

## 2015-05-15 ENCOUNTER — Other Ambulatory Visit: Payer: Self-pay | Admitting: *Deleted

## 2015-05-15 ENCOUNTER — Telehealth: Payer: Self-pay

## 2015-05-15 ENCOUNTER — Telehealth: Payer: Self-pay | Admitting: *Deleted

## 2015-05-15 NOTE — Telephone Encounter (Signed)
TC to patient and informed him of appointment on tomorrow @ 10:15 am  He voiced understanding.

## 2015-05-15 NOTE — Telephone Encounter (Signed)
Pls ask him to come in tomorrow, no labs, see me at 1015 am, 30 mins

## 2015-05-15 NOTE — Telephone Encounter (Signed)
Pt called saying he needs oxycodone refil, and he never got a call on 7/29. Rx is in prescription book. A voice message had been left on 7/29. Called pt back and told him his rx is ready for pickup and to bring his drivers license.

## 2015-05-15 NOTE — Telephone Encounter (Signed)
TC received from patient. He states he is very constipated, despite drinking > 2 liters of fluid/dasily and increasing his fruit/vegetable intake.  He has not had a bowel movement in 4 days. He would like a prescription for something -his medicaid will pay for it that way.  He also states his neck pain is getting quite unbearable and that he is needing the oxycodone 3 tabs almost every 3 hours and it is not helping very much.  He did go to PT in May for this but his medicaid would only pay for 2 visits.  He states he has bruised area on the back of his neck that is getting quite large and that there is a 'burning sensation' in this area and around 'his whole neck'

## 2015-05-16 ENCOUNTER — Ambulatory Visit (HOSPITAL_BASED_OUTPATIENT_CLINIC_OR_DEPARTMENT_OTHER): Payer: Medicaid Other | Admitting: Hematology and Oncology

## 2015-05-16 ENCOUNTER — Telehealth: Payer: Self-pay | Admitting: Hematology and Oncology

## 2015-05-16 VITALS — BP 114/69 | HR 67 | Temp 98.2°F | Resp 18 | Ht 66.0 in | Wt 170.4 lb

## 2015-05-16 DIAGNOSIS — C7802 Secondary malignant neoplasm of left lung: Secondary | ICD-10-CM

## 2015-05-16 DIAGNOSIS — E038 Other specified hypothyroidism: Secondary | ICD-10-CM | POA: Diagnosis not present

## 2015-05-16 DIAGNOSIS — K5909 Other constipation: Secondary | ICD-10-CM | POA: Diagnosis not present

## 2015-05-16 DIAGNOSIS — G893 Neoplasm related pain (acute) (chronic): Secondary | ICD-10-CM

## 2015-05-16 DIAGNOSIS — M542 Cervicalgia: Secondary | ICD-10-CM

## 2015-05-16 DIAGNOSIS — C119 Malignant neoplasm of nasopharynx, unspecified: Secondary | ICD-10-CM

## 2015-05-16 MED ORDER — OXYCODONE-ACETAMINOPHEN 5-325 MG PO TABS: 1.0000 | ORAL_TABLET | Freq: Three times a day (TID) | ORAL | Status: DC | PRN

## 2015-05-16 MED ORDER — POLYETHYLENE GLYCOL 3350 17 G PO PACK: 17.0000 g | PACK | Freq: Every day | ORAL | Status: DC

## 2015-05-16 MED ORDER — LIDOCAINE-PRILOCAINE 2.5-2.5 % EX CREA: 1.0000 "application " | TOPICAL_CREAM | CUTANEOUS | Status: DC | PRN

## 2015-05-16 NOTE — Assessment & Plan Note (Addendum)
He has mild worsening neck pain. He declined more pain medicine. I recommend a trial of topical lidocaine. He also wanted to try a special collar that was recommended by physical therapist. I gave him a prescription and recommend he goes to a medical supply store to see if they would stock it

## 2015-05-16 NOTE — Assessment & Plan Note (Signed)
Overall, he has no symptoms. His prior CT scan showed lung nodule. I will order another CT scan next month for evaluation

## 2015-05-16 NOTE — Assessment & Plan Note (Signed)
He has severe constipation. I gave him prescription oral laxative. I suspect this is caused by his pain and pain medication but other causes such as undertreated hypothyroidism cannot be excluded. I will recheck his thyroid function test next month

## 2015-05-16 NOTE — Assessment & Plan Note (Signed)
He has worsening fatigue and constipation which could be pain related. I will recheck thyroid function tests next month to see if the dose of levothyroxine needs to be increased.

## 2015-05-16 NOTE — Telephone Encounter (Signed)
per pof to sch pt appt-gave pt copy of avs-adv [facility] to call & scg scans-pt understood

## 2015-05-16 NOTE — Progress Notes (Signed)
[place] OFFICE PROGRESS NOTE  Patient Care Team: Provider Not In System as PCP - General Christian Lark, MD as Consulting Physician (Hematology and Oncology) Leota Sauers, RN as Oncology Nurse Navigator Christian Gibson, MD as Attending Physician (Radiation Oncology)  SUMMARY OF ONCOLOGIC HISTORY: Oncology History   Nasopharyngeal cancer   Primary site: Pharynx - Nasopharynx   Staging method: AJCC 7th Edition   Clinical: Stage IVC (T3, N2, M1) signed by Christian Lark, MD on 06/03/2014 10:08 PM   Summary: Stage IVC (T3, N2, M1) He was diagnosed in [place] and received treatment in [place] and [place]. 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 Pan-endoscopy elsewhere showed cancer from nasopharyngeal space.   01/04/2007 - 02/20/2007 Chemotherapy He received 2 cycles of cisplatin and 5FU followed by concurrent chemo with weekly cisplatin and radiation. He only received 2 doses of chemo due to severe mucositis, nausea and weight loss.   04/05/2007 - 08/04/2007 Chemotherapy He received 4 more courses of cisplatin with 5FU and had complete response   07/05/2009 Procedure Fine-needle aspirate of the right level II lymph nodes come from recurrent metastatic disease. Repeat endoscopy and CT scan show no evidence of disease elsewhere.   07/08/2009 - 12/02/2009 Chemotherapy He was given 6 cycles of carboplatin, 5-FU and docetaxel   12/03/2009 Surgery He has surgery to the residual lymph node on the right neck which showed no evidence of disease.   02/22/2012 Imaging Repeat imaging study showed large recurrent mass. He was referred elsewhere for further treatment.   05/03/2012 Surgery He underwent left upper lobectomy.   04/29/2013 Imaging PEt scan showed lesion on right level II B and lower lung was abnormal   06/03/2013 - 02/02/2014 Chemotherapy He had 6 cycles of chemotherapy when  he was found to have recurrence of cancer and had received oxaliplatin and capecitabine   06/07/2014 Imaging PET CT scan showed persistent disease in the right neck lymph nodes and left lung   07/18/2014 - 07/31/2014 Radiation Therapy He received palliative radiation therapy to the lungs   10/10/2014 Imaging CT scan of the chest, abdomen and pelvis show regression in the size of the lung nodule in the left upper lobe and stable pulmonary nodules   01/24/2015 Imaging CT scan showed stable disease in neck and lung    INTERVAL HISTORY: Please see below for problem oriented charting. He is seen urgently because of worsening neck pain. He has taken 2 Percocet per day twice a day and it brings minimal relief. He denies new lymphadenopathy. Denies recent neck injury. His sleep poorly and complaining of 5 days constipation.  REVIEW OF SYSTEMS:   Constitutional: Denies fevers, chills or abnormal weight loss Eyes: Denies blurriness of vision Ears, nose, mouth, throat, and face: Denies mucositis or sore throat Respiratory: Denies cough, dyspnea or wheezes Cardiovascular: Denies palpitation, chest discomfort or lower extremity swelling Skin: Denies abnormal skin rashes Lymphatics: Denies new lymphadenopathy or easy bruising Neurological:Denies numbness, tingling or new weaknesses Behavioral/Psych: Mood is stable, no new changes  All other systems were reviewed with the patient and are negative.  I have reviewed the past medical history, past surgical history, social history and family history with the patient and they are unchanged from previous note.  ALLERGIES:  has No Known Allergies.  MEDICATIONS:  Current Outpatient Prescriptions  Medication Sig Dispense Refill  . diclofenac  sodium (VOLTAREN) 1 % GEL Apply 2 g topically 2 (two) times daily as needed (Neck pain). 100 g 1  . levothyroxine (SYNTHROID, LEVOTHROID) 75 MCG tablet TAKE 1 TABLET BY MOUTH DAILY BEFORE BREAKFAST 90 tablet 2  .  oxyCODONE-acetaminophen (PERCOCET/ROXICET) 5-325 MG per tablet Take 1 tablet by mouth every 8 (eight) hours as needed for severe pain. 90 tablet 0  . lidocaine-prilocaine (EMLA) cream Apply 1 application topically as needed. 30 g 3  . polyethylene glycol (MIRALAX) packet Take 17 g by mouth daily. 14 each 0   No current facility-administered medications for this visit.    PHYSICAL EXAMINATION: ECOG PERFORMANCE STATUS: 1 - Symptomatic but completely ambulatory  Filed Vitals:   05/16/15 1024  BP: 114/69  Pulse: 67  Temp: 98.2 F (36.8 C)  Resp: 18   Filed Weights   05/16/15 1024  Weight: 170 lb 6.4 oz (77.293 kg)    GENERAL:alert, no distress and comfortable SKIN: skin color, texture, turgor are normal, no rashes or significant lesions EYES: normal, Conjunctiva are pink and non-injected, sclera clear OROPHARYNX:no exudate, no erythema and lips, buccal mucosa, and tongue normal  NECK: supple, thyroid normal size, non-tender, without nodularity. Well-healed surgical scar. He has significant point tenderness on the right neck Musculoskeletal:no cyanosis of digits and no clubbing  NEURO: alert & oriented x 3 with fluent speech, no focal motor/sensory deficits  LABORATORY DATA:  I have reviewed the data as listed    Component Value Date/Time   NA 142 01/24/2015 0950   NA 141 06/29/2014 0655   K 4.0 01/24/2015 0950   K 4.4 06/29/2014 0655   CL 104 06/29/2014 0655   CO2 23 01/24/2015 0950   CO2 28 06/29/2014 0655   GLUCOSE 98 01/24/2015 0950   GLUCOSE 95 06/29/2014 0655   BUN 14.7 01/24/2015 0950   BUN 19 06/29/2014 0655   CREATININE 1.0 01/24/2015 0950   CREATININE 0.97 06/29/2014 0655   CALCIUM 9.6 01/24/2015 0950   CALCIUM 9.4 06/29/2014 0655   PROT 7.3 01/24/2015 0950   PROT 7.1 06/29/2014 0655   ALBUMIN 4.3 01/24/2015 0950   ALBUMIN 4.1 06/29/2014 0655   AST 27 01/24/2015 0950   AST 29 06/29/2014 0655   ALT 32 01/24/2015 0950   ALT 24 06/29/2014 0655   ALKPHOS 54  01/24/2015 0950   ALKPHOS 47 06/29/2014 0655   BILITOT 0.42 01/24/2015 0950   BILITOT 0.3 06/29/2014 0655   GFRNONAA >90 06/29/2014 0655   GFRAA >90 06/29/2014 0655    No results found for: SPEP, UPEP  Lab Results  Component Value Date   WBC 5.2 01/24/2015   NEUTROABS 3.7 01/24/2015   HGB 14.9 01/24/2015   HCT 43.8 01/24/2015   MCV 88.8 01/24/2015   PLT 192 01/24/2015      Chemistry      Component Value Date/Time   NA 142 01/24/2015 0950   NA 141 06/29/2014 0655   K 4.0 01/24/2015 0950   K 4.4 06/29/2014 0655   CL 104 06/29/2014 0655   CO2 23 01/24/2015 0950   CO2 28 06/29/2014 0655   BUN 14.7 01/24/2015 0950   BUN 19 06/29/2014 0655   CREATININE 1.0 01/24/2015 0950   CREATININE 0.97 06/29/2014 0655      Component Value Date/Time   CALCIUM 9.6 01/24/2015 0950   CALCIUM 9.4 06/29/2014 0655   ALKPHOS 54 01/24/2015 0950   ALKPHOS 47 06/29/2014 0655   AST 27 01/24/2015 0950   AST 29 06/29/2014 0655  ALT 32 01/24/2015 0950   ALT 24 06/29/2014 0655   BILITOT 0.42 01/24/2015 0950   BILITOT 0.3 06/29/2014 0655      ASSESSMENT & PLAN:  Nasopharyngeal cancer The patient has slight worsening pain around his neck at prior sites of disease. I will try to get his pain under control and plan to order a CT scan of the chest and neck for further evaluation and to exclude disease recurrence   Metastasis to lung Overall, he has no symptoms. His prior CT scan showed lung nodule. I will order another CT scan next month for evaluation  Neck pain on right side He has mild worsening neck pain. He declined more pain medicine. I recommend a trial of topical lidocaine. He also wanted to try a special collar that was recommended by physical therapist. I gave him a prescription and recommend he goes to a medical supply store to see if they would stock it  Hypothyroidism He has worsening fatigue and constipation which could be pain related. I will recheck thyroid function tests  next month to see if the dose of levothyroxine needs to be increased.  Other constipation He has severe constipation. I gave him prescription oral laxative. I suspect this is caused by his pain and pain medication but other causes such as undertreated hypothyroidism cannot be excluded. I will recheck his thyroid function test next month     Orders Placed This Encounter  Procedures  . CBC with Differential/Platelet    Standing Status: Future     Number of Occurrences:      Standing Expiration Date: 06/19/2016  . Comprehensive metabolic panel    Standing Status: Future     Number of Occurrences:      Standing Expiration Date: 06/19/2016   All questions were answered. The patient knows to call the clinic with any problems, questions or concerns. No barriers to learning was detected. I spent 25 minutes counseling the patient face to face. The total time spent in the appointment was 40 minutes and more than 50% was on counseling and review of test results     [facility], Christian Willmann, MD 05/16/2015 11:11 AM

## 2015-05-16 NOTE — Assessment & Plan Note (Signed)
The patient has slight worsening pain around his neck at prior sites of disease. I will try to get his pain under control and plan to order a CT scan of the chest and neck for further evaluation and to exclude disease recurrence

## 2015-05-20 ENCOUNTER — Telehealth: Payer: Self-pay | Admitting: *Deleted

## 2015-05-20 NOTE — Telephone Encounter (Signed)
Pt requests letter to excuse him from school this afternoon.  States it needs to say his pain medication makes him drowsy.  Letter left for pt out front.   He says he will pick it up tomorrow.

## 2015-05-31 ENCOUNTER — Other Ambulatory Visit: Payer: Medicaid Other

## 2015-06-03 ENCOUNTER — Ambulatory Visit: Payer: Medicaid Other | Admitting: Hematology and Oncology

## 2015-06-13 ENCOUNTER — Ambulatory Visit: Payer: Medicaid Other | Admitting: Hematology and Oncology

## 2015-06-13 ENCOUNTER — Other Ambulatory Visit: Payer: Medicaid Other

## 2015-06-14 ENCOUNTER — Other Ambulatory Visit: Payer: Self-pay | Admitting: Hematology and Oncology

## 2015-06-14 ENCOUNTER — Telehealth: Payer: Self-pay | Admitting: *Deleted

## 2015-06-14 ENCOUNTER — Ambulatory Visit: Payer: Medicaid Other | Admitting: Hematology and Oncology

## 2015-06-14 NOTE — Telephone Encounter (Signed)
Pt called and said he tried to call us twice yesterday but kept getting disconnected.  He could not make his appt to see Dr. Alvy Bimler today due to his pregnant wife has been at [facility] for past five days with severe morning sickness.  He asks to be rescheduled.  He says he has not been scheduled for his CT yet either.   Informed pt we will send order to Scheduling to get his CT scan scheduled and then to see Dr. Alvy Bimler after CT scan.   He says he has about 10 pain pills left,  He is taking one to three per day.   Instructed him to call us for refill before he runs out.  He verbalized understanding.

## 2015-06-14 NOTE — Telephone Encounter (Signed)
Sent new POF

## 2015-06-17 ENCOUNTER — Other Ambulatory Visit: Payer: Self-pay | Admitting: *Deleted

## 2015-06-17 ENCOUNTER — Telehealth: Payer: Self-pay | Admitting: Hematology and Oncology

## 2015-06-17 DIAGNOSIS — C119 Malignant neoplasm of nasopharynx, unspecified: Secondary | ICD-10-CM

## 2015-06-17 NOTE — Telephone Encounter (Signed)
s.w. pt and advised on Sept appt pt ok and aware °

## 2015-06-17 NOTE — Telephone Encounter (Signed)
Per staff message from NG she will see patient 9/15 @ 1:45 pm. Follow up already on schedule. Spoke with patient confirming lab/ct 9/14 - ct @ [place] and f/u 9/15.

## 2015-06-18 ENCOUNTER — Ambulatory Visit (HOSPITAL_COMMUNITY): Payer: Medicaid Other

## 2015-06-19 ENCOUNTER — Ambulatory Visit (HOSPITAL_COMMUNITY): Payer: Medicaid Other

## 2015-06-19 ENCOUNTER — Ambulatory Visit (HOSPITAL_COMMUNITY)
Admission: RE | Admit: 2015-06-19 | Discharge: 2015-06-19 | Disposition: A | Discharge status: Home / Self Care | Payer: Medicaid Other | Source: Ambulatory Visit | Attending: Hematology and Oncology | Admitting: Hematology and Oncology

## 2015-06-19 ENCOUNTER — Other Ambulatory Visit (HOSPITAL_BASED_OUTPATIENT_CLINIC_OR_DEPARTMENT_OTHER): Payer: Medicaid Other

## 2015-06-19 ENCOUNTER — Other Ambulatory Visit: Payer: Self-pay | Admitting: *Deleted

## 2015-06-19 DIAGNOSIS — C7800 Secondary malignant neoplasm of unspecified lung: Secondary | ICD-10-CM | POA: Insufficient documentation

## 2015-06-19 DIAGNOSIS — Z923 Personal history of irradiation: Secondary | ICD-10-CM | POA: Insufficient documentation

## 2015-06-19 DIAGNOSIS — C119 Malignant neoplasm of nasopharynx, unspecified: Secondary | ICD-10-CM | POA: Insufficient documentation

## 2015-06-19 DIAGNOSIS — Z9221 Personal history of antineoplastic chemotherapy: Secondary | ICD-10-CM | POA: Diagnosis not present

## 2015-06-19 LAB — CBC WITH DIFFERENTIAL/PLATELET
BASO%: 0.8 % (ref 0.0–2.0)
Basophils Absolute: 0 10*3/uL (ref 0.0–0.1)
EOS ABS: 0.1 10*3/uL (ref 0.0–0.5)
EOS%: 2.3 % (ref 0.0–7.0)
HEMATOCRIT: 43.1 % (ref 38.4–49.9)
HGB: 14.6 g/dL (ref 13.0–17.1)
LYMPH#: 1.1 10*3/uL (ref 0.9–3.3)
LYMPH%: 25.7 % (ref 14.0–49.0)
MCH: 29.9 pg (ref 27.2–33.4)
MCHC: 33.8 g/dL (ref 32.0–36.0)
MCV: 88.5 fL (ref 79.3–98.0)
MONO#: 0.5 10*3/uL (ref 0.1–0.9)
MONO%: 11.5 % (ref 0.0–14.0)
NEUT%: 59.7 % (ref 39.0–75.0)
NEUTROS ABS: 2.5 10*3/uL (ref 1.5–6.5)
PLATELETS: 196 10*3/uL (ref 140–400)
RBC: 4.87 10*6/uL (ref 4.20–5.82)
RDW: 13.7 % (ref 11.0–14.6)
WBC: 4.2 10*3/uL (ref 4.0–10.3)

## 2015-06-19 LAB — COMPREHENSIVE METABOLIC PANEL (CC13)
ALBUMIN: 4.3 g/dL (ref 3.5–5.0)
ALK PHOS: 52 U/L (ref 40–150)
ALT: 27 U/L (ref 0–55)
AST: 22 U/L (ref 5–34)
Anion Gap: 7 mEq/L (ref 3–11)
BUN: 13.9 mg/dL (ref 7.0–26.0)
CALCIUM: 10.1 mg/dL (ref 8.4–10.4)
CO2: 30 mEq/L — ABNORMAL HIGH (ref 22–29)
CREATININE: 1.1 mg/dL (ref 0.7–1.3)
Chloride: 103 mEq/L (ref 98–109)
Glucose: 101 mg/dl (ref 70–140)
Potassium: 4.4 mEq/L (ref 3.5–5.1)
Sodium: 141 mEq/L (ref 136–145)
TOTAL PROTEIN: 7.2 g/dL (ref 6.4–8.3)
Total Bilirubin: 0.29 mg/dL (ref 0.20–1.20)

## 2015-06-19 MED ORDER — OXYCODONE-ACETAMINOPHEN 5-325 MG PO TABS: 1.0000 | ORAL_TABLET | Freq: Three times a day (TID) | ORAL | Status: DC | PRN

## 2015-06-19 MED ORDER — IOHEXOL 300 MG/ML  SOLN
100.0000 mL | Freq: Once | INTRAMUSCULAR | Status: AC | PRN
  Administered 2015-06-19: 80 mL via INTRAVENOUS

## 2015-06-19 NOTE — Telephone Encounter (Signed)
Rx for percocet given to pt in lobby.  He completed his CT scan and is aware of MD appt tomorrow.

## 2015-06-20 ENCOUNTER — Telehealth: Payer: Self-pay | Admitting: Hematology and Oncology

## 2015-06-20 ENCOUNTER — Other Ambulatory Visit: Payer: Self-pay | Admitting: Hematology and Oncology

## 2015-06-20 ENCOUNTER — Ambulatory Visit (HOSPITAL_BASED_OUTPATIENT_CLINIC_OR_DEPARTMENT_OTHER): Payer: Medicaid Other | Admitting: Hematology and Oncology

## 2015-06-20 ENCOUNTER — Encounter: Payer: Self-pay | Admitting: Hematology and Oncology

## 2015-06-20 VITALS — BP 127/63 | HR 75 | Temp 98.4°F | Resp 20 | Ht 66.0 in | Wt 171.8 lb

## 2015-06-20 DIAGNOSIS — M542 Cervicalgia: Secondary | ICD-10-CM | POA: Diagnosis not present

## 2015-06-20 DIAGNOSIS — C119 Malignant neoplasm of nasopharynx, unspecified: Secondary | ICD-10-CM

## 2015-06-20 DIAGNOSIS — Z23 Encounter for immunization: Secondary | ICD-10-CM | POA: Diagnosis not present

## 2015-06-20 DIAGNOSIS — C7801 Secondary malignant neoplasm of right lung: Secondary | ICD-10-CM

## 2015-06-20 DIAGNOSIS — C7802 Secondary malignant neoplasm of left lung: Secondary | ICD-10-CM | POA: Diagnosis not present

## 2015-06-20 MED ORDER — INFLUENZA VAC SPLIT QUAD 0.5 ML IM SUSY
0.5000 mL | PREFILLED_SYRINGE | Freq: Once | INTRAMUSCULAR | Status: AC
  Administered 2015-06-20: 0.5 mL via INTRAMUSCULAR
  Filled 2015-06-20: qty 0.5

## 2015-06-20 NOTE — Patient Instructions (Signed)
Pembrolizumab injection  What is this medicine?  PEMBROLIZUMAB (pem broe liz ue mab) is used to treat certain types of melanoma, a skin cancer. It targets specific cancer cells and stops the cancer cells from growing.  This medicine may be used for other purposes; ask your health care provider or pharmacist if you have questions.  COMMON BRAND NAME(S): Keytruda  What should I tell my health care provider before I take this medicine?  They need to know if you have any of these conditions:  -immune system problems  -inflammatory bowel disease  -liver disease  -lung or breathing disease  -lupus  -an unusual or allergic reaction to pembrolizumab, other medicines, foods, dyes, or preservatives  -pregnant or trying to get pregnant  -breast-feeding  How should I use this medicine?  This medicine is for infusion into a vein. It is given by a health care professional in a hospital or clinic setting.  A special MedGuide will be given to you before each treatment. Be sure to read this information carefully each time.  Talk to your pediatrician regarding the use of this medicine in children. Special care may be needed.  Overdosage: If you think you've taken too much of this medicine contact a poison control center or emergency room at once.  Overdosage: If you think you have taken too much of this medicine contact a poison control center or emergency room at once.  NOTE: This medicine is only for you. Do not share this medicine with others.  What if I miss a dose?  It is important not to miss your dose. Call your doctor or health care professional if you are unable to keep an appointment.  What may interact with this medicine?  Interactions have not been studied.  Give your health care provider a list of all the medicines, herbs, non-prescription drugs, or dietary supplements you use. Also tell them if you smoke, drink alcohol, or use illegal drugs. Some items may interact with your medicine.  This list may not describe all  possible interactions. Give your health care provider a list of all the medicines, herbs, non-prescription drugs, or dietary supplements you use. Also tell them if you smoke, drink alcohol, or use illegal drugs. Some items may interact with your medicine.  What should I watch for while using this medicine?  Your condition will be monitored carefully while you are receiving this medicine.  You may need blood work done while you are taking this medicine.  Do not become pregnant while taking this medicine or for 4 months after stopping it. Women should inform their doctor if they wish to become pregnant or think they might be pregnant. There is a potential for serious side effects to an unborn child. Talk to your health care professional or pharmacist for more information. Do not breast-feed an infant while taking this medicine.  What side effects may I notice from receiving this medicine?  Side effects that you should report to your doctor or health care professional as soon as possible:  -allergic reactions like skin rash, itching or hives, swelling of the face, lips, or tongue  -bloody or black, tarry stools  -change in the amount of urine  -changes in vision  -chest pain  -dark urine  -dizziness or feeling faint or lightheaded  -fast or irregular heartbeat  -hair loss  -muscle pain  -muscle weakness  -persistent headache  -shortness of breath  -signs and symptoms of liver injury like dark urine, light-colored stools, loss of   appetite, nausea, right upper belly pain, yellowing of the eyes or skin  -stomach pain  -weight loss  Side effects that usually do not require medical attention (Report these to your doctor or health care professional if they continue or are bothersome.):constipation  -cough  -diarrhea  -joint pain  -tiredness  This list may not describe all possible side effects. Call your doctor for medical advice about side effects. You may report side effects to FDA at 1-800-FDA-1088.  Where should I keep  my medicine?  This drug is given in a hospital or clinic and will not be stored at home.  NOTE: This sheet is a summary. It may not cover all possible information. If you have questions about this medicine, talk to your doctor, pharmacist, or health care provider.   2015, Elsevier/Gold Standard. (2013-06-15 12:52:03)

## 2015-06-20 NOTE — Assessment & Plan Note (Signed)
He is not symptomatic. CT scan showed stable lung nodules

## 2015-06-20 NOTE — Assessment & Plan Note (Signed)
He has mild worsening neck pain. He declined more pain medicine. I recommend a trial of topical lidocaine.

## 2015-06-20 NOTE — Assessment & Plan Note (Signed)
I reviewed the imaging study with the patient. The measurement of the region of abnormality is difficult to determine. The patient is having pain at the same region that could be related to chronic pain from prior surgery. I recommend PET CT scan for staging. I will bring up his case for ENT tumor board for review next week. I will see him back after that to review test results and further treatment recommendation. The patient is not keen for further surgery. 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 I gave patient some of the treatment handout and side effect profile for him to read.

## 2015-06-20 NOTE — Telephone Encounter (Signed)
Pt confirmed labs/ov per 09/15 POF, gave pt AVS and Calendar.... Christian Simmons

## 2015-06-20 NOTE — Progress Notes (Signed)
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 Pan-endoscopy elsewhere showed cancer from nasopharyngeal space.   01/04/2007 - 02/20/2007 Chemotherapy He received 2 cycles of cisplatin and 5FU followed by concurrent chemo with weekly cisplatin and radiation. He only received 2 doses of chemo due to severe mucositis, nausea and weight loss.   04/05/2007 - 08/04/2007 Chemotherapy He received 4 more courses of cisplatin with 5FU and had complete response   07/05/2009 Procedure Fine-needle aspirate of the right level II lymph nodes come from recurrent metastatic disease. Repeat endoscopy and CT scan show no evidence of disease elsewhere.   07/08/2009 - 12/02/2009 Chemotherapy He was given 6 cycles of carboplatin, 5-FU and docetaxel   12/03/2009 Surgery He has surgery to the residual lymph node on the right neck which showed no evidence of disease.   02/22/2012 Imaging Repeat imaging study showed large recurrent mass. He was referred elsewhere for further treatment.   05/03/2012 Surgery He underwent left upper lobectomy.   04/29/2013 Imaging PEt scan showed lesion on right level II B and lower lung was abnormal   06/03/2013 - 02/02/2014 Chemotherapy He had 6 cycles of chemotherapy when  he was found to have recurrence of cancer and had received oxaliplatin and capecitabine   06/07/2014 Imaging PET CT scan showed persistent disease in the right neck lymph nodes and left lung   07/18/2014 - 07/31/2014 Radiation Therapy He received palliative radiation therapy to the lungs   10/10/2014 Imaging CT scan of the chest, abdomen and pelvis show regression in the size of the lung nodule in the left upper lobe and stable pulmonary nodules   01/24/2015 Imaging CT scan showed stable disease in neck and lung   06/19/2015 Imaging CT scan of the neck and the chest show possible mild progression of the nodule in the right side of the neck.    INTERVAL HISTORY: Please see below for problem oriented charting. He returns for further follow-up. He continues to have neck pain. He has been using his pain medicine twice a day. He tried to use less of it because he is afraid he will get addicted.   REVIEW OF SYSTEMS:   Constitutional: Denies fevers, chills or abnormal weight loss Eyes: Denies blurriness of vision Ears, nose, mouth, throat, and face: Denies mucositis or sore throat Respiratory: Denies cough, dyspnea or wheezes Cardiovascular: Denies palpitation, chest discomfort or lower extremity swelling Gastrointestinal:  Denies nausea, heartburn or change in bowel habits Skin: Denies abnormal skin rashes Lymphatics: Denies new lymphadenopathy or easy bruising Neurological:Denies numbness, tingling or new weaknesses Behavioral/Psych: Mood is stable, no new changes  All other systems were reviewed with the patient and are negative.  I have reviewed the past medical history, past surgical history, social history and family  history with the patient and they are unchanged from previous note.  ALLERGIES:  has No Known Allergies.  MEDICATIONS:  Current Outpatient Prescriptions  Medication Sig Dispense Refill  . diclofenac sodium (VOLTAREN) 1 % GEL Apply 2 g topically 2 (two) times daily as needed  (Neck pain). 100 g 1  . levothyroxine (SYNTHROID, LEVOTHROID) 75 MCG tablet TAKE 1 TABLET BY MOUTH DAILY BEFORE BREAKFAST 90 tablet 2  . lidocaine-prilocaine (EMLA) cream Apply 1 application topically as needed. 30 g 3  . oxyCODONE-acetaminophen (PERCOCET/ROXICET) 5-325 MG per tablet Take 1 tablet by mouth every 8 (eight) hours as needed for severe pain. 90 tablet 0  . polyethylene glycol (MIRALAX) packet Take 17 g by mouth daily. 14 each 0   No current facility-administered medications for this visit.    PHYSICAL EXAMINATION: ECOG PERFORMANCE STATUS: 1 - Symptomatic but completely ambulatory  Filed Vitals:   06/20/15 1417  BP: 127/63  Pulse: 75  Temp: 98.4 F (36.9 C)  Resp: 20   Filed Weights   06/20/15 1417  Weight: 171 lb 12.8 oz (77.928 kg)    GENERAL:alert, no distress and comfortable SKIN: skin color, texture, turgor are normal, no rashes or significant lesions EYES: normal, Conjunctiva are pink and non-injected, sclera clear OROPHARYNX:no exudate, no erythema and lips, buccal mucosa, and tongue normal  NECK: Persistent scarring on the right side of the neck. Musculoskeletal:no cyanosis of digits and no clubbing  NEURO: alert & oriented x 3 with fluent speech, no focal motor/sensory deficits  LABORATORY DATA:  I have reviewed the data as listed    Component Value Date/Time   NA 141 06/19/2015 1026   NA 141 06/29/2014 0655   K 4.4 06/19/2015 1026   K 4.4 06/29/2014 0655   CL 104 06/29/2014 0655   CO2 30* 06/19/2015 1026   CO2 28 06/29/2014 0655   GLUCOSE 101 06/19/2015 1026   GLUCOSE 95 06/29/2014 0655   BUN 13.9 06/19/2015 1026   BUN 19 06/29/2014 0655   CREATININE 1.1 06/19/2015 1026   CREATININE 0.97 06/29/2014 0655   CALCIUM 10.1 06/19/2015 1026   CALCIUM 9.4 06/29/2014 0655   PROT 7.2 06/19/2015 1026   PROT 7.1 06/29/2014 0655   ALBUMIN 4.3 06/19/2015 1026   ALBUMIN 4.1 06/29/2014 0655   AST 22 06/19/2015 1026   AST 29 06/29/2014 0655   ALT 27  06/19/2015 1026   ALT 24 06/29/2014 0655   ALKPHOS 52 06/19/2015 1026   ALKPHOS 47 06/29/2014 0655   BILITOT 0.29 06/19/2015 1026   BILITOT 0.3 06/29/2014 0655   GFRNONAA >90 06/29/2014 0655   GFRAA >90 06/29/2014 0655    No results found for: SPEP, UPEP  Lab Results  Component Value Date   WBC 4.2 06/19/2015   NEUTROABS 2.5 06/19/2015   HGB 14.6 06/19/2015   HCT 43.1 06/19/2015   MCV 88.5 06/19/2015   PLT 196 06/19/2015      Chemistry      Component Value Date/Time   NA 141 06/19/2015 1026   NA 141 06/29/2014 0655   K 4.4 06/19/2015 1026   K 4.4 06/29/2014 0655   CL 104 06/29/2014 0655   CO2 30* 06/19/2015 1026   CO2 28 06/29/2014 0655   BUN 13.9 06/19/2015 1026   BUN 19 06/29/2014 0655   CREATININE 1.1 06/19/2015 1026   CREATININE 0.97 06/29/2014 0655      Component Value Date/Time   CALCIUM 10.1 06/19/2015 1026   CALCIUM 9.4 06/29/2014 0655   ALKPHOS  52 06/19/2015 1026   ALKPHOS 47 06/29/2014 0655   AST 22 06/19/2015 1026   AST 29 06/29/2014 0655   ALT 27 06/19/2015 1026   ALT 24 06/29/2014 0655   BILITOT 0.29 06/19/2015 1026   BILITOT 0.3 06/29/2014 0655       RADIOGRAPHIC STUDIES: I reviewed the imaging with the patient I have personally reviewed the radiological images as listed and agreed with the findings in the report. Ct Soft Tissue Neck W Contrast  06/19/2015   CLINICAL DATA:  Nasopharyngeal carcinoma with lung metastases. Chemotherapy completed in 2014. Radiation therapy completed 01/2014. Restaging.  EXAM: CT NECK WITH CONTRAST  TECHNIQUE: Multidetector CT imaging of the neck was performed using the standard protocol following the bolus administration of intravenous contrast.  CONTRAST:  2m OMNIPAQUE IOHEXOL 300 MG/ML  SOLN  COMPARISON:  01/24/2015  FINDINGS: Pharynx and larynx: Mild asymmetric soft tissue fullness involving the posterior right nasopharynx has not significantly changed. Slight mucosal irregularity along the posterior left  nasopharynx on the prior study is no longer evident. No discrete pharyngeal mass is identified. Larynx is unremarkable.  Salivary glands: Right submandibular gland is not visualized and was likely previously resected. Left submandibular gland is unremarkable. Mild fatty infiltration of the parotid glands is unchanged.  Thyroid: Unremarkable.  Lymph nodes: Prior right-sided neck dissection again noted. Ill-defined hyperenhancing soft tissue in the right neck at the level IIB level has increased in size, measuring approximately 1.9 x 1.8 cm (series 8, image 38, previously 1.4 x 1.1 cm). This soft tissue appears to directly involve the right sternocleidomastoid muscle, with greater superficial extension laterally than on the prior study (series 8, image 40). Ill-defined fat infiltration and obscuration of tissue planes elsewhere throughout the right neck is similar to the prior study and may be related to prior dissection and radiation therapy. No new right-sided neck mass/new right cervical lymphadenopathy is identified. A high left level IV lymph node appears slightly larger and slightly more rounded than on the prior study, measuring 7 mm in short axis (series 8, image 69, previously 5 mm). Small left supraclavicular lymph nodes are similar to the prior study.  Vascular: Right internal jugular vein is surgically absent. Other major vascular structures of the neck are patent.  Limited intracranial: The visualized portion of the brain is unremarkable.  Visualized orbits: Unremarkable.  Mastoids and visualized paranasal sinuses: Mild left greater than right maxillary sinus mucosal thickening. Clear mastoid air cells.  Skeleton: No suspicious lytic or blastic osseous lesions identified.  Upper chest: Evaluated on concurrent chest CT.  IMPRESSION: 1. Enlargement of ill-defined enhancing tissue involving the right sternocleidomastoid muscle in level II, concerning for metastasis. 2. 7 mm left level IV lymph node,  minimally larger than on the prior study and indeterminate.   Electronically Signed   By: ALogan BoresM.D.   On: 06/19/2015 12:34   Ct Chest W Contrast  06/19/2015   CLINICAL DATA:  Nasopharyngeal carcinoma with lung metastasis. Chemo radiation therapy complete. Radiation therapy complete.  EXAM: CT CHEST WITH CONTRAST  TECHNIQUE: Multidetector CT imaging of the chest was performed during intravenous contrast administration.  CONTRAST:  835mOMNIPAQUE IOHEXOL 300 MG/ML  SOLN  COMPARISON:  CT 01/24/2015  FINDINGS: Mediastinum/Nodes: No axillary supraclavicular lymphadenopathy. No mediastinal hilar adenopathy. No pericardial fluid. Esophagus normal.  Lungs/Pleura: Postsurgical change in the LEFT upper lobe consists with partial resection. There is pleural-parenchymal thickening inferior to the suture line measuring 16 mm by 11 mm not changed from 17  mm by 12 mm. This same lesion measures slightly thicker in coronal projection measuring 12 mm compared to 10 mm (image 47, series 3). There is left suprahilar peribronchial thickening and bronchiectasis unchanged. Overall the density of the peribronchial LEFT suprahilar consolidation is decreased.  4 mm nodule in the LEFT lower lobe on image 31, series 7 is unchanged.  Within the RIGHT lower lobe 4 mm nodule image 33 is also unchanged.  Upper abdomen: No new pulmonary nodules. Limited view of the liver, kidneys, pancreas are unremarkable. Normal adrenal glands. Small nonobstructing LEFT renal calculus noted.  Musculoskeletal:  No aggressive osseous lesion.  IMPRESSION: 1. Pleural-parenchymal nodular thickening inferior to the surgical margin in the LEFT upper lobe measures slightly thicker on the coronal projection. Recommend short-term follow-up CT versus FDG PET scan for further evaluation. 2. Stable small bilateral lower lobe pulmonary nodules. 3. Please see separate report for CT of the evaluation.   Electronically Signed   By: Suzy Bouchard M.D.   On:  06/19/2015 12:25     ASSESSMENT & PLAN:  Nasopharyngeal cancer I reviewed the imaging study with the patient. The measurement of the region of abnormality is difficult to determine. The patient is having pain at the same region that could be related to chronic pain from prior surgery. I recommend PET CT scan for staging. I will bring up his case for ENT tumor board for review next week. I will see him back after that to review test results and further treatment recommendation. The patient is not keen for further surgery. 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 I gave patient some of the treatment handout and side effect profile for him to read.  Neck pain on right side He has mild worsening neck pain. He declined more pain medicine. I recommend a trial of topical lidocaine.   Metastasis to lung He is not symptomatic. CT scan showed stable lung nodules   No orders of the defined types were placed in this encounter.   All questions were answered. The patient knows to call the clinic with any problems, questions or concerns. No barriers to learning was detected. I spent 20 minutes counseling the patient face to face. The total time spent in the appointment was 25 minutes and more than 50% was on counseling and review of test results     [facility] [place], [place], MD 06/20/2015 2:46 PM

## 2015-06-21 ENCOUNTER — Telehealth: Payer: Self-pay | Admitting: *Deleted

## 2015-06-21 NOTE — Telephone Encounter (Signed)
  Oncology Nurse Navigator Documentation   Navigator Encounter Type: Telephone (06/21/15 0940)         Interventions: Coordination of Care (06/21/15 0940)     Called patient LVM indicating he has 9/20 0730 PET scheduled at [facility], with arrival time of 0700.  Further indicated NPO after midnight except for small sips of water with critical medication in morning. Requested return call to confirm receipt of message.  Gayleen Orem, RN, BSN, [place] [facility] [place] [phone]           Time Spent with Patient: 15 (06/21/15 0940)

## 2015-06-25 ENCOUNTER — Encounter (HOSPITAL_COMMUNITY)
Admission: RE | Admit: 2015-06-25 | Discharge: 2015-06-25 | Disposition: A | Discharge status: Home / Self Care | Payer: Medicaid Other | Source: Ambulatory Visit | Attending: Hematology and Oncology | Admitting: Hematology and Oncology

## 2015-06-25 DIAGNOSIS — C119 Malignant neoplasm of nasopharynx, unspecified: Secondary | ICD-10-CM | POA: Diagnosis present

## 2015-06-25 LAB — GLUCOSE, CAPILLARY: Glucose-Capillary: 96 mg/dL (ref 65–99)

## 2015-06-25 MED ORDER — FLUDEOXYGLUCOSE F - 18 (FDG) INJECTION
8.5000 | Freq: Once | INTRAVENOUS | Status: DC | PRN
Start: 2015-06-25 — End: 2015-07-01
  Administered 2015-06-25: 8.5 via INTRAVENOUS
  Filled 2015-06-25: qty 8.5

## 2015-06-27 ENCOUNTER — Encounter: Payer: Self-pay | Admitting: Hematology and Oncology

## 2015-06-27 ENCOUNTER — Ambulatory Visit (HOSPITAL_BASED_OUTPATIENT_CLINIC_OR_DEPARTMENT_OTHER): Payer: Medicaid Other | Admitting: Hematology and Oncology

## 2015-06-27 VITALS — BP 119/69 | HR 86 | Temp 98.3°F | Resp 18 | Ht 66.0 in | Wt 173.5 lb

## 2015-06-27 DIAGNOSIS — C119 Malignant neoplasm of nasopharynx, unspecified: Secondary | ICD-10-CM | POA: Diagnosis present

## 2015-06-27 DIAGNOSIS — M542 Cervicalgia: Secondary | ICD-10-CM

## 2015-06-27 NOTE — Assessment & Plan Note (Addendum)
Unfortunately, the patient had disease recurrence in the right neck region. That appears to be the only site of disease. We have discussed his case at the most recent ENT tumor board. Consensus would be referral to ENT for possible repeat resection.3  I will also touch base with his radiation oncologist to see further radiation treatment is possible Otherwise, I could certainly offer more chemotherapy in the future.

## 2015-06-27 NOTE — Assessment & Plan Note (Signed)
He has mild worsening neck pain. This is related to disease. He declined more pain medicine. I recommend a trial of topical lidocaine.

## 2015-06-27 NOTE — Progress Notes (Signed)
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 Pan-endoscopy elsewhere showed cancer from nasopharyngeal space.   01/04/2007 - 02/20/2007 Chemotherapy He received 2 cycles of cisplatin and 5FU followed by concurrent chemo with weekly cisplatin and radiation. He only received 2 doses of chemo due to severe mucositis, nausea and weight loss.   04/05/2007 - 08/04/2007 Chemotherapy He received 4 more courses of cisplatin with 5FU and had complete response   07/05/2009 Procedure Fine-needle aspirate of the right level II lymph nodes come from recurrent metastatic disease. Repeat endoscopy and CT scan show no evidence of disease elsewhere.   07/08/2009 - 12/02/2009 Chemotherapy He was given 6 cycles of carboplatin, 5-FU and docetaxel   12/03/2009 Surgery He has surgery to the residual lymph node on the right neck which showed no evidence of disease.   02/22/2012 Imaging Repeat imaging study showed large recurrent mass. He was referred elsewhere for further treatment.   05/03/2012 Surgery He underwent left upper lobectomy.   04/29/2013 Imaging PEt scan showed lesion on right level II B and lower lung was abnormal   06/03/2013 - 02/02/2014 Chemotherapy He had 6 cycles of chemotherapy when  he was found to have recurrence of cancer and had received oxaliplatin and capecitabine   06/07/2014 Imaging PET CT scan showed persistent disease in the right neck lymph nodes and left lung   06/29/2014 Procedure Accession: [id number] repeat LUL biopsy confirmed metastatic cancer   07/18/2014 - 07/31/2014 Radiation Therapy He received palliative radiation therapy to the lungs   10/10/2014 Imaging CT scan of the chest, abdomen and pelvis show regression in the size of the lung nodule in the left upper lobe and stable pulmonary nodules   01/24/2015 Imaging CT scan showed stable disease in neck and lung   06/19/2015 Imaging CT scan of the neck and the chest show possible mild progression of the nodule in the right side of the neck.   06/25/2015 Imaging PET scan confirmed disease recurrence in the neck    INTERVAL HISTORY: Please see below for problem oriented charting.  he returns for further follow-up.  he continues to have persistent neck pain.  REVIEW OF SYSTEMS:   Constitutional: Denies fevers, chills or abnormal weight loss Eyes: Denies blurriness of vision Ears, nose, mouth, throat, and face: Denies mucositis or sore throat Respiratory: Denies cough, dyspnea or wheezes Cardiovascular: Denies palpitation, chest discomfort or lower extremity swelling Gastrointestinal:  Denies nausea, heartburn or change in bowel habits Skin: Denies abnormal skin rashes Lymphatics: Denies new lymphadenopathy or easy bruising Neurological:Denies numbness, tingling or new weaknesses Behavioral/Psych: Mood is stable, no new changes  All other systems were reviewed with the patient and are negative.  I have reviewed the past medical history, past surgical history, social history and  family history with the patient and they are unchanged from previous note.  ALLERGIES:  has No Known Allergies.  MEDICATIONS:  Current Outpatient Prescriptions  Medication Sig Dispense Refill  . diclofenac sodium (VOLTAREN) 1 %  GEL Apply 2 g topically 2 (two) times daily as needed (Neck pain). 100 g 1  . levothyroxine (SYNTHROID, LEVOTHROID) 75 MCG tablet TAKE 1 TABLET BY MOUTH DAILY BEFORE BREAKFAST 90 tablet 2  . lidocaine-prilocaine (EMLA) cream Apply 1 application topically as needed. 30 g 3  . oxyCODONE-acetaminophen (PERCOCET/ROXICET) 5-325 MG per tablet Take 1 tablet by mouth every 8 (eight) hours as needed for severe pain. 90 tablet 0  . polyethylene glycol (MIRALAX) packet Take 17 g by mouth daily. 14 each 0   No current facility-administered medications for this visit.   Facility-Administered Medications Ordered in Other Visits  Medication Dose Route Frequency Provider Last Rate Last Dose  . fludeoxyglucose F - 18 (FDG) injection 8.5 milli Curie  8.5 milli Curie Intravenous Once PRN Medication Radiologist, MD   8.5 milli Curie at 06/25/15 0730    PHYSICAL EXAMINATION: ECOG PERFORMANCE STATUS: 1 - Symptomatic but completely ambulatory  Filed Vitals:   06/27/15 1504  BP: 119/69  Pulse: 86  Temp: 98.3 F (36.8 C)  Resp: 18   Filed Weights   06/27/15 1504  Weight: 173 lb 8 oz (78.699 kg)    GENERAL:alert, no distress and comfortable SKIN: skin color, texture, turgor are normal, no rashes or significant lesions EYES: normal, Conjunctiva are pink and non-injected, sclera clear Musculoskeletal:no cyanosis of digits and no clubbing  NEURO: alert & oriented x 3 with fluent speech, no focal motor/sensory deficits  LABORATORY DATA:  I have reviewed the data as listed    Component Value Date/Time   NA 141 06/19/2015 1026   NA 141 06/29/2014 0655   K 4.4 06/19/2015 1026   K 4.4 06/29/2014 0655   CL 104 06/29/2014 0655   CO2 30* 06/19/2015 1026   CO2 28 06/29/2014 0655   GLUCOSE 101 06/19/2015 1026   GLUCOSE 95 06/29/2014 0655   BUN 13.9 06/19/2015 1026   BUN 19 06/29/2014 0655   CREATININE 1.1 06/19/2015 1026   CREATININE 0.97 06/29/2014 0655   CALCIUM 10.1 06/19/2015 1026   CALCIUM 9.4  06/29/2014 0655   PROT 7.2 06/19/2015 1026   PROT 7.1 06/29/2014 0655   ALBUMIN 4.3 06/19/2015 1026   ALBUMIN 4.1 06/29/2014 0655   AST 22 06/19/2015 1026   AST 29 06/29/2014 0655   ALT 27 06/19/2015 1026   ALT 24 06/29/2014 0655   ALKPHOS 52 06/19/2015 1026   ALKPHOS 47 06/29/2014 0655   BILITOT 0.29 06/19/2015 1026   BILITOT 0.3 06/29/2014 0655   GFRNONAA >90 06/29/2014 0655   GFRAA >90 06/29/2014 0655    No results found for: SPEP, UPEP  Lab Results  Component Value Date   WBC 4.2 06/19/2015   NEUTROABS 2.5 06/19/2015   HGB 14.6 06/19/2015   HCT 43.1 06/19/2015   MCV 88.5 06/19/2015   PLT 196 06/19/2015      Chemistry      Component Value Date/Time   NA 141 06/19/2015 1026   NA 141 06/29/2014 0655   K 4.4 06/19/2015 1026   K 4.4 06/29/2014 0655   CL 104 06/29/2014 0655   CO2 30* 06/19/2015 1026   CO2 28 06/29/2014 0655   BUN 13.9 06/19/2015 1026   BUN 19 06/29/2014 0655   CREATININE 1.1 06/19/2015 1026   CREATININE  0.97 06/29/2014 0655      Component Value Date/Time   CALCIUM 10.1 06/19/2015 1026   CALCIUM 9.4 06/29/2014 0655   ALKPHOS 52 06/19/2015 1026   ALKPHOS 47 06/29/2014 0655   AST 22 06/19/2015 1026   AST 29 06/29/2014 0655   ALT 27 06/19/2015 1026   ALT 24 06/29/2014 0655   BILITOT 0.29 06/19/2015 1026   BILITOT 0.3 06/29/2014 0655       RADIOGRAPHIC STUDIES: I reviewed the PET scan with the patient I have personally reviewed the radiological images as listed and agreed with the findings in the report.  ASSESSMENT & PLAN:  Nasopharyngeal cancer  Unfortunately, the patient had disease recurrence in the right neck region. That appears to be the only site of disease. We have discussed his case at the most recent ENT tumor board. Consensus would be referral to ENT for possible repeat resection.3  I will also touch base with his radiation oncologist to see further radiation treatment is possible Otherwise, I could certainly offer more  chemotherapy in the future.  Neck pain on right side He has mild worsening neck pain. This is related to disease. He declined more pain medicine. I recommend a trial of topical lidocaine.    Orders Placed This Encounter  Procedures  . Ambulatory referral to ENT    Referral Priority:  Routine    Referral Type:  Consultation    Referral Reason:  Specialty Services Required    Referred to Provider:  Jodi Marble, MD    Requested Specialty:  Otolaryngology    Number of Visits Requested:  1   All questions were answered. The patient knows to call the clinic with any problems, questions or concerns. No barriers to learning was detected. I spent 15 minutes counseling the patient face to face. The total time spent in the appointment was 20 minutes and more than 50% was on counseling and review of test results     [facility], [place], MD 06/27/2015 3:32 PM

## 2015-06-28 ENCOUNTER — Telehealth: Payer: Self-pay | Admitting: Hematology and Oncology

## 2015-06-28 NOTE — Telephone Encounter (Signed)
Per 9/22 pof refer to ENT - no f/u w/NG at this time. Patient [place] and per patient he is a patient at [facility] at [place] [phone]). Spoke with Philippa Chester at [facility] re referral to ENT and needing auth and NPI# - per Philippa Chester patient sees Dr. Sandi Mariscal PIR#[id number]. [facility] [phone]) and spoke with Janett Billow - per Janett Billow patient can be seen with auth and pcp NPI# but when his information was run through [facility] - patient's information says he is assigned to [facility] [phone]) and [facility] cannot accept auth/NPI information for [facility] it has to be the assigned provider on the [facility] card/[employer]. Spoke with Crystal at [facility] and per [employer] patient is not in their system and has never been seen in their office, however patient can call to establish care and they will provide information for the referral. Due to the above I am not able to obtain an appointment at this time. Patient will either need to contact his Medicaid case worker to have his primary switched to [facility] or establish care with [facility] - both patient and Dr. Alvy Bimler made aware. Per patient he will try to establish care with [facility] due to making the switch through Medicaid will take too long. Patient provided with information for [facility] and the Medicaid Dept. Patient also has my name and direct number to let me know when he has done one or the other.

## 2015-07-02 ENCOUNTER — Telehealth: Payer: Self-pay | Admitting: *Deleted

## 2015-07-02 NOTE — Telephone Encounter (Signed)
Call received from patient.  Triage offered to help but asked to speak with Christian Simmons.  Call transferred.

## 2015-07-02 NOTE — Telephone Encounter (Signed)
Pt called to confirm which ENT Dr. Alvy Bimler wants him to see at [facility].   They called pt for appt but he wanted to make sure it was the right doctor.  Informed him that the referral is to Dr. Erik Obey and pt says he will call them back to make appt.  I gave him the appt to the office. Asked him to call back if any problems.  He verbalized understanding.

## 2015-07-08 DIAGNOSIS — C770 Secondary and unspecified malignant neoplasm of lymph nodes of head, face and neck: Secondary | ICD-10-CM | POA: Insufficient documentation

## 2015-07-09 ENCOUNTER — Other Ambulatory Visit: Payer: Self-pay | Admitting: Hematology and Oncology

## 2015-07-09 ENCOUNTER — Telehealth: Payer: Self-pay

## 2015-07-09 DIAGNOSIS — C119 Malignant neoplasm of nasopharynx, unspecified: Secondary | ICD-10-CM

## 2015-07-09 MED ORDER — OXYCODONE-ACETAMINOPHEN 5-325 MG PO TABS: 2.0000 | ORAL_TABLET | Freq: Four times a day (QID) | ORAL | Status: DC | PRN

## 2015-07-09 NOTE — Telephone Encounter (Signed)
Pt notified of refill ready.  Labs requested from Dr [facility] office

## 2015-07-09 NOTE — Telephone Encounter (Signed)
Also, I increase it to 2 tabs q 6 hours prn

## 2015-07-09 NOTE — Telephone Encounter (Signed)
Patient needs refill on percocet.  L/F 06-19-15 for 90 tabs. He states that he has 3 tabs left . He said that his older prescription 1 tablet lasted him 6 hours.  The new prescription is only lasting him 3 hrs with 1 tablet.  He would like to pick up prescription today.

## 2015-07-09 NOTE — Telephone Encounter (Signed)
Ready for pick up Please call Dr. Noreene Filbert office for notes from last week's visit

## 2015-07-19 ENCOUNTER — Other Ambulatory Visit: Payer: Self-pay | Admitting: Hematology and Oncology

## 2015-07-23 ENCOUNTER — Telehealth: Payer: Self-pay | Admitting: *Deleted

## 2015-07-23 NOTE — Telephone Encounter (Signed)
  Oncology Nurse Navigator Documentation   Navigator Encounter Type: Telephone (07/23/15 1152)         Interventions: Coordination of Care (07/23/15 1152)     Received return call from Biagio Borg, RN, Dr. Waynard Edwards office with patient update. She noted:  Initial consult with Dr. Vicie Mutters was on 07/08/15.  MRI scheduled for 07/24/15.  Follow-up consult scheduled for 1024 at 0900 to discuss tmt plan options. Robin to let me know if Dr. Vicie Mutters wants historic/recent imaging.  Gayleen Orem, RN, BSN, [place] [facility] [place] [phone]            Time Spent with Patient: 15 (07/23/15 1152)

## 2015-07-29 ENCOUNTER — Telehealth: Payer: Self-pay | Admitting: *Deleted

## 2015-07-29 ENCOUNTER — Other Ambulatory Visit: Payer: Self-pay | Admitting: Hematology and Oncology

## 2015-07-29 MED ORDER — MORPHINE SULFATE 15 MG PO TABS: 15.0000 mg | ORAL_TABLET | ORAL | Status: DC | PRN

## 2015-07-29 NOTE — Telephone Encounter (Signed)
Pt reports neck pain is worse,  He is taking 2 percocet every 6 hrs and it is not helping his pain.  Pt states he doesn't understand why pain medication not working now?  He says he is scared to take stronger pain meds, but he is not sure what to do as his pain is "very bad."    Informed Dr. Alvy Bimler of increased pain and she states she can only try to manage pain at this time w/ increase in pain medication.  Pt is due to have MRI at [facility] soon and it may reveal the reason for his increase in pain.  Meanwhile Dr. Alvy Bimler can prescribe stronger pain medication.   Discussed pain medication w/ pt and his fears of addiction and his frustration at pain getting worse.  Provided active listening,  Acknowledged his fears. We have had discussions about addiction on at least 3 different occasions.  I have attempted to educate pt about addiction and using pain meds for pain vs. just for the side effects.  I have attempted to alleviate pt's concerns as he has never shown any signs of abuse of his pain meds.  In fact pt has been very reluctant to take narcotic pain medication due to the side effects and worries about addiction.   Encouraged pt to take new Rx from Dr. Alvy Bimler for morphine.   His appt for MRI at [facility] in on November 2 nd.    Informed him Dr. Alvy Bimler will f/u after he is seen at [place] agreed to pick up new Rx for morphine.   He also talked about being scared of possibly needing surgery.  Encouraged pt to take it one step at a time.  We don't know yet if he needs surgery.  His MRI should reveal more answers hopefully.  He verbalized understanding and will come by to pick up Rx.Marland Kitchen

## 2015-07-31 ENCOUNTER — Other Ambulatory Visit: Payer: Self-pay | Admitting: *Deleted

## 2015-07-31 MED ORDER — OXYCODONE HCL 15 MG PO TABS: 15.0000 mg | ORAL_TABLET | Freq: Four times a day (QID) | ORAL | Status: DC | PRN

## 2015-07-31 NOTE — Telephone Encounter (Signed)
Pt reports new Rx for MS 15 mg does help his pain but is making him feel really "out of it" so he can't function.  He feels like his heart rate slows down too much and he doesn't even want to get out of bed or talk to anyone.   He says the oxycodone didn't affect him as badly and asks if he can have Rx for Oxycodone 15 mg?  He has some Oxycodone 5 mg left over at home.  He wants to take the 15 mg when his pain is really bad and continue to use the 5 mg when he needs to function more and pain is not as bad.   Ok w/ Dr. Alvy Bimler for pt to try Oxycodone 15 mg.   Instructed pt to come and pick up Rx.  Instructed him to hold on the Morphine until he knows the oxycodone works,  Then he can dispose of it safely.  Make sure all his meds are hidden out of reach of children.  He verbalized understanding.

## 2015-08-08 ENCOUNTER — Telehealth: Payer: Self-pay | Admitting: *Deleted

## 2015-08-08 NOTE — Telephone Encounter (Signed)
"  I am a patient of Dr. Alvy Bimler.  I need to speak with her nurse Cameo.  I did MRI yesterday and need to see results."  Message forwarded.  No F/U at this time

## 2015-08-09 ENCOUNTER — Other Ambulatory Visit: Payer: Self-pay | Admitting: Hematology and Oncology

## 2015-08-09 ENCOUNTER — Telehealth: Payer: Self-pay | Admitting: *Deleted

## 2015-08-09 ENCOUNTER — Telehealth: Payer: Self-pay | Admitting: Hematology and Oncology

## 2015-08-09 NOTE — Telephone Encounter (Signed)
Spoke with patient. He is OK meeting with you on 11/22.  Needs refill Oxycodone '5mg'$ . Cannot sleep- neck has been swelling for 3-5 days

## 2015-08-09 NOTE — Telephone Encounter (Signed)
s.w. pt and advised on NOV appt....pt ok and aware °

## 2015-08-09 NOTE — Telephone Encounter (Signed)
I suggest he proceed w his appt first so that he can see himself what the MRI showed Immunotherapy is only a backup plan. I can schedule appt to see him on 11/22 instead of before so he can see what they offer first If this is acceptable I can place POF

## 2015-08-09 NOTE — Telephone Encounter (Signed)
I do not have his MRI result When is he going to be seen at [facility]?

## 2015-08-09 NOTE — Telephone Encounter (Signed)
I just gave him a script for 15 mg oxycodone last week. He is not due for refill I placed POF for appt 11/22

## 2015-08-09 NOTE — Telephone Encounter (Signed)
Pt states he goes to [facility] on 11/21. Wants to see Dr Alvy Bimler first, states he is scared of the surgery and wants to consider the immunotherapy

## 2015-08-12 ENCOUNTER — Encounter: Payer: Self-pay | Admitting: *Deleted

## 2015-08-12 ENCOUNTER — Telehealth: Payer: Self-pay | Admitting: *Deleted

## 2015-08-12 NOTE — Telephone Encounter (Signed)
Pt in lobby asking for Refill on Oxycodone 5 mg tablets.   Pt says he has almost 80 pills left of the Oxy 15 mg tablets.  He has been taking one 15 mg every morning and then a 5 mg in afternoon and sometimes another 5 mg at Bedtime.   Discussed pain regimen w/ pt and explained it is safer for him to just cut the 15 mg tablets in half when needed.  He can take a whole tab in am and 1/2 tab afternoon and evenings.  It is not as safe to have different bottles of different doses pain medication.  He could mix them up..  Dr. Alvy Bimler prefers to keep it simple and prescribe one dose which pt can easily cut in half when needed.  Pt expressed some concern again about Tolerance and Dependance to pain medication again.  We discussed again and I encouraged him to take pain medication as needed, if/when his pain improves then he can slowly taper off the pain medication.   Pt verbalized understanding.  He agreed to take 1/2 of oxycodone 15 mg tabs instead of getting new Rx for 5 mg today.

## 2015-08-14 NOTE — Telephone Encounter (Signed)
Collaborative nurse spoke with patient on 08-12-2015.

## 2015-08-27 ENCOUNTER — Encounter: Payer: Self-pay | Admitting: Hematology and Oncology

## 2015-08-27 ENCOUNTER — Ambulatory Visit (HOSPITAL_BASED_OUTPATIENT_CLINIC_OR_DEPARTMENT_OTHER): Payer: Medicaid Other | Admitting: Hematology and Oncology

## 2015-08-27 ENCOUNTER — Other Ambulatory Visit: Payer: Self-pay | Admitting: Hematology and Oncology

## 2015-08-27 VITALS — BP 120/77 | HR 84 | Temp 98.2°F | Resp 18 | Ht 66.0 in | Wt 182.0 lb

## 2015-08-27 DIAGNOSIS — M542 Cervicalgia: Secondary | ICD-10-CM

## 2015-08-27 DIAGNOSIS — C119 Malignant neoplasm of nasopharynx, unspecified: Secondary | ICD-10-CM | POA: Diagnosis not present

## 2015-08-27 DIAGNOSIS — C7800 Secondary malignant neoplasm of unspecified lung: Secondary | ICD-10-CM

## 2015-08-27 MED ORDER — OXYCODONE HCL ER 15 MG PO T12A
15.0000 mg | EXTENDED_RELEASE_TABLET | Freq: Two times a day (BID) | ORAL | Status: DC
Start: 2015-08-27 — End: 2015-09-09

## 2015-08-27 MED ORDER — OXYCODONE HCL 15 MG PO TABS
15.0000 mg | ORAL_TABLET | Freq: Four times a day (QID) | ORAL | Status: DC | PRN
Start: 2015-08-27 — End: 2015-09-17

## 2015-08-27 NOTE — Assessment & Plan Note (Signed)
The patient underwent further testing at [facility] which show localized disease to the right side of the neck. He is somewhat reluctant to pursue surgical option even though that might render him disease-free. He requested a trial of immunotherapy. The decision was made based on recent publication on NEJM. Role of treatment is palliative and it is category 1 recommendation according to NCCN guidelines.  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 Wyline Mood, M.D., Ph.D., Lisabeth Devoid, Brooke Bonito., M.D., Janann August, M.D., Ph.D., Mal Misty, M.D., A. Ivar Drape, M.D., Lottie Dawson, M.D., Florence Canner, Ph.D., F.R.C.P., F.R.C.R., Beaulah Corin, M.D., Jonna Coup. Mack Guise, M.D., Mendel Ryder, M.D., Linde Gillis, M.D., Lissa Merlin, M.D., Osie Bond. Stacie Glaze, M.D., Wenda Overland, M.D., Ethelene Hal, M.D., Maggie Font, M.D., Ph.D., Arby Barrette, M.D., Ph.D., Mertie Moores, M.D., Sherrye Payor, Ph.D., Ronnald Nian, Ph.D., Trish Mage, Ph.D., Hunt Oris. Evern Bio.D., Ph.D., M.P.H., and Maura L. Arman Filter, M.D., Ph.D. Alison Stalling J Med 2016; 680:[id number] 88, 2016DOI: 10.1056/[id number]  In this randomized, open-label, phase 3 trial, we assigned, in a 2:1 ratio, 361 patients with recurrent squamous-cell carcinoma of the head and neck whose disease had progressed within 6 months after platinum-based chemotherapy to receive nivolumab (at a dose of 3 mg per kilogram of body weight) every 2 weeks or standard, single-agent systemic therapy (methotrexate, docetaxel, or cetuximab). The primary end point was overall survival. Additional end points included progression-free survival, rate of objective response, safety, and patient-reported quality of life.  The median overall survival was 7.5 months (95% confidence interval [CI], 5.5 to 9.1) in the nivolumab group versus 5.1 months (95% CI, 4.0 to 6.0) in the group that received standard therapy. Overall  survival was significantly longer with nivolumab than with standard therapy (hazard ratio for death, 0.70; 97.73% CI, 0.51 to 0.96; P=0.01), and the estimates of the 1-year survival rate were approximately 19 percentage points higher with nivolumab than with standard therapy (36.0% vs. 16.6%). The median progression-free survival was 2.0 months (95% CI, 1.9 to 2.1) with nivolumab versus 2.3 months (95% CI, 1.9 to 3.1) with standard therapy (hazard ratio for disease progression or death, 0.89; 95% CI, 0.70 to 1.13; P=0.32). The rate of progression-free survival at 6 months was 19.7% with nivolumab versus 9.9% with standard therapy. The response rate was 13.3% in the nivolumab group versus 5.8% in the standard-therapy group. Treatment-related adverse events of grade 3 or 4 occurred in 13.1% of the patients in the nivolumab group versus 35.1% of those in the standard-therapy group. Physical, role, and social functioning was stable in the nivolumab group, whereas it was meaningfully worse in the standard-therapy group.  We discussed some of the risks, benefits, side-effects of Nivolumab  Some of the short term side-effects included, though not limited to, risk of fatigue, pancytopenia, life-threatening infections, allergic reactions, nausea, vomiting, sores in the mouth, changes in bowel habits especially diarrhea, admission to hospital for various reasons, and risks of death.   The patient is aware that the response rates discussed earlier is not guaranteed.    After a long discussion, patient made an informed decision to proceed.   Patient education material was dispensed.

## 2015-08-27 NOTE — Assessment & Plan Note (Signed)
The patient has tries variouss combination of pain medicine. He felt that the oxycodone low-dose is best tolerated but the effect does not last long. I recommend a trial of long-acting OxyContin and he agreed to proceed

## 2015-08-27 NOTE — Progress Notes (Signed)
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 Pan-endoscopy elsewhere showed cancer from nasopharyngeal space.   01/04/2007 - 02/20/2007 Chemotherapy He received 2 cycles of cisplatin and 5FU followed by concurrent chemo with weekly cisplatin and radiation. He only received 2 doses of chemo due to severe mucositis, nausea and weight loss.   04/05/2007 - 08/04/2007 Chemotherapy He received 4 more courses of cisplatin with 5FU and had complete response   07/05/2009 Procedure Fine-needle aspirate of the right level II lymph nodes come from recurrent metastatic disease. Repeat endoscopy and CT scan show no evidence of disease elsewhere.   07/08/2009 - 12/02/2009 Chemotherapy He was given 6 cycles of carboplatin, 5-FU and docetaxel   12/03/2009 Surgery He has surgery to the residual lymph node on the right neck which showed no evidence of disease.   02/22/2012 Imaging Repeat imaging study showed large recurrent mass. He was referred elsewhere for further treatment.   05/03/2012 Surgery He underwent left upper lobectomy.   04/29/2013 Imaging PEt scan showed lesion on right level II B and lower lung was abnormal    06/03/2013 - 02/02/2014 Chemotherapy He had 6 cycles of chemotherapy when he was found to have recurrence of cancer and had received oxaliplatin and capecitabine   06/07/2014 Imaging PET CT scan showed persistent disease in the right neck lymph nodes and left lung   06/29/2014 Procedure Accession: [id number] repeat LUL biopsy confirmed metastatic cancer   07/18/2014 - 07/31/2014 Radiation Therapy He received palliative radiation therapy to the lungs   10/10/2014 Imaging CT scan of the chest, abdomen and pelvis show regression in the size of the lung nodule in the left upper lobe and stable pulmonary nodules   01/24/2015 Imaging CT scan showed stable disease in neck and lung   06/19/2015 Imaging CT scan of the neck and the chest show possible mild progression of the nodule in the right side of the neck.   06/25/2015 Imaging PET scan confirmed disease recurrence in the neck   07/07/2015 Imaging He had MRI neck at [facility]: Please see below for problem oriented charting. He returns for further follow-up. He has obtained an opinion at [facility] regarding surgical option but he made up his mind not to pursue surgical options for fear that it could cause significant long-term disability of his right upper extremity. He told me his gut feeling told him that he should try immunotherapy first. In the meantime, his pain remained poorly controlled but he declined stronger pain medicine. He denies nausea or constipation  REVIEW OF SYSTEMS:   Constitutional: Denies fevers, chills or abnormal weight loss Eyes: Denies blurriness of vision Ears, nose, mouth, throat, and face: Denies mucositis  or sore throat Respiratory: Denies cough, dyspnea or wheezes Cardiovascular: Denies palpitation, chest discomfort or lower extremity swelling Gastrointestinal:  Denies nausea, heartburn or change in bowel habits Skin: Denies abnormal skin rashes Lymphatics: Denies new lymphadenopathy or easy  bruising Neurological:Denies numbness, tingling or new weaknesses Behavioral/Psych: Mood is stable, no new changes  All other systems were reviewed with the patient and are negative.  I have reviewed the past medical history, past surgical history, social history and family history with the patient and they are unchanged from previous note.  ALLERGIES:  has No Known Allergies.  MEDICATIONS:  Current Outpatient Prescriptions  Medication Sig Dispense Refill  . diclofenac sodium (VOLTAREN) 1 % GEL Apply 2 g topically 2 (two) times daily as needed (Neck pain). 100 g 1  . levothyroxine (SYNTHROID, LEVOTHROID) 75 MCG tablet TAKE 1 TABLET BY MOUTH DAILY BEFORE BREAKFAST 90 tablet 2  . lidocaine-prilocaine (EMLA) cream Apply 1 application topically as needed. 30 g 3  . oxyCODONE (OXYCONTIN) 15 mg 12 hr tablet Take 1 tablet (15 mg total) by mouth every 12 (twelve) hours. 60 tablet 0  . oxyCODONE (ROXICODONE) 15 MG immediate release tablet Take 1 tablet (15 mg total) by mouth every 6 (six) hours as needed for pain. 90 tablet 0  . polyethylene glycol (MIRALAX) packet Take 17 g by mouth daily. 14 each 0   No current facility-administered medications for this visit.    PHYSICAL EXAMINATION: ECOG PERFORMANCE STATUS: 1 - Symptomatic but completely ambulatory  Filed Vitals:   08/27/15 1336  BP: 120/77  Pulse: 84  Temp: 98.2 F (36.8 C)  Resp: 18   Filed Weights   08/27/15 1336  Weight: 182 lb (82.555 kg)    GENERAL:alert, no distress and comfortable SKIN: skin color, texture, turgor are normal, no rashes or significant lesions EYES: normal, Conjunctiva are pink and non-injected, sclera clear OROPHARYNX:no exudate, no erythema and lips, buccal mucosa, and tongue normal  NECK: The right side of his neck is very tender to touch. There is some fullness on the right side of the neck corresponding to the area of abnormality  LYMPH:  no palpable lymphadenopathy in the cervical, axillary or  inguinal LUNGS: clear to auscultation and percussion with normal breathing effort HEART: regular rate & rhythm and no murmurs and no lower extremity edema ABDOMEN:abdomen soft, non-tender and normal bowel sounds Musculoskeletal:no cyanosis of digits and no clubbing  NEURO: alert & oriented x 3 with fluent speech, no focal motor/sensory deficits  LABORATORY DATA:  I have reviewed the data as listed    Component Value Date/Time   NA 141 06/19/2015 1026   NA 141 06/29/2014 0655   K 4.4 06/19/2015 1026   K 4.4 06/29/2014 0655   CL 104 06/29/2014 0655   CO2 30* 06/19/2015 1026   CO2 28 06/29/2014 0655   GLUCOSE 101 06/19/2015 1026   GLUCOSE 95 06/29/2014 0655   BUN 13.9 06/19/2015 1026   BUN 19 06/29/2014 0655   CREATININE 1.1 06/19/2015 1026   CREATININE 0.97 06/29/2014 0655   CALCIUM 10.1 06/19/2015 1026   CALCIUM 9.4 06/29/2014 0655   PROT 7.2 06/19/2015 1026   PROT 7.1 06/29/2014 0655   ALBUMIN 4.3 06/19/2015 1026   ALBUMIN 4.1 06/29/2014 0655   AST 22 06/19/2015 1026   AST 29 06/29/2014 0655   ALT 27 06/19/2015 1026   ALT 24 06/29/2014 0655   ALKPHOS 52 06/19/2015 1026   ALKPHOS 47 06/29/2014 0655   BILITOT 0.29 06/19/2015 1026  BILITOT 0.3 06/29/2014 0655   GFRNONAA >90 06/29/2014 0655   GFRAA >90 06/29/2014 0655    No results found for: SPEP, UPEP  Lab Results  Component Value Date   WBC 4.2 06/19/2015   NEUTROABS 2.5 06/19/2015   HGB 14.6 06/19/2015   HCT 43.1 06/19/2015   MCV 88.5 06/19/2015   PLT 196 06/19/2015      Chemistry      Component Value Date/Time   NA 141 06/19/2015 1026   NA 141 06/29/2014 0655   K 4.4 06/19/2015 1026   K 4.4 06/29/2014 0655   CL 104 06/29/2014 0655   CO2 30* 06/19/2015 1026   CO2 28 06/29/2014 0655   BUN 13.9 06/19/2015 1026   BUN 19 06/29/2014 0655   CREATININE 1.1 06/19/2015 1026   CREATININE 0.97 06/29/2014 0655      Component Value Date/Time   CALCIUM 10.1 06/19/2015 1026   CALCIUM 9.4 06/29/2014 0655    ALKPHOS 52 06/19/2015 1026   ALKPHOS 47 06/29/2014 0655   AST 22 06/19/2015 1026   AST 29 06/29/2014 0655   ALT 27 06/19/2015 1026   ALT 24 06/29/2014 0655   BILITOT 0.29 06/19/2015 1026   BILITOT 0.3 06/29/2014 0655      ASSESSMENT & PLAN:  Nasopharyngeal cancer The patient underwent further testing at [facility] which show localized disease to the right side of the neck. He is somewhat reluctant to pursue surgical option even though that might render him disease-free. He requested a trial of immunotherapy. The decision was made based on recent publication on NEJM. Role of treatment is palliative and it is category 1 recommendation according to NCCN guidelines.  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 Wyline Mood, M.D., Ph.D., Lisabeth Devoid, Brooke Bonito., M.D., Janann August, M.D., Ph.D., Mal Misty, M.D., A. Ivar Drape, M.D., Lottie Dawson, M.D., Florence Canner, Ph.D., F.R.C.P., F.R.C.R., Beaulah Corin, M.D., Jonna Coup. Mack Guise, M.D., Mendel Ryder, M.D., Linde Gillis, M.D., Lissa Merlin, M.D., Osie Bond. Stacie Glaze, M.D., Wenda Overland, M.D., Ethelene Hal, M.D., Maggie Font, M.D., Ph.D., Arby Barrette, M.D., Ph.D., Mertie Moores, M.D., Sherrye Payor, Ph.D., Ronnald Nian, Ph.D., Trish Mage, Ph.D., Hunt Oris. Evern Bio.D., Ph.D., M.P.H., and Maura L. Arman Filter, M.D., Ph.D. Alison Stalling J Med 2016; [id number]:[id number] 31, 2016DOI: 10.1056/[id number]  In this randomized, open-label, phase 3 trial, we assigned, in a 2:1 ratio, 361 patients with recurrent squamous-cell carcinoma of the head and neck whose disease had progressed within 6 months after platinum-based chemotherapy to receive nivolumab (at a dose of 3 mg per kilogram of body weight) every 2 weeks or standard, single-agent systemic therapy (methotrexate, docetaxel, or cetuximab). The primary end point was overall survival. Additional end points included progression-free survival, rate of  objective response, safety, and patient-reported quality of life.  The median overall survival was 7.5 months (95% confidence interval [CI], 5.5 to 9.1) in the nivolumab group versus 5.1 months (95% CI, 4.0 to 6.0) in the group that received standard therapy. Overall survival was significantly longer with nivolumab than with standard therapy (hazard ratio for death, 0.70; 97.73% CI, 0.51 to 0.96; P=0.01), and the estimates of the 1-year survival rate were approximately 19 percentage points higher with nivolumab than with standard therapy (36.0% vs. 16.6%). The median progression-free survival was 2.0 months (95% CI, 1.9 to 2.1) with nivolumab versus 2.3 months (95% CI, 1.9 to 3.1) with standard therapy (hazard ratio for disease progression or death, 0.89; 95% CI, 0.70 to 1.13; P=0.32). The  rate of progression-free survival at 6 months was 19.7% with nivolumab versus 9.9% with standard therapy. The response rate was 13.3% in the nivolumab group versus 5.8% in the standard-therapy group. Treatment-related adverse events of grade 3 or 4 occurred in 13.1% of the patients in the nivolumab group versus 35.1% of those in the standard-therapy group. Physical, role, and social functioning was stable in the nivolumab group, whereas it was meaningfully worse in the standard-therapy group.  We discussed some of the risks, benefits, side-effects of Nivolumab  Some of the short term side-effects included, though not limited to, risk of fatigue, pancytopenia, life-threatening infections, allergic reactions, nausea, vomiting, sores in the mouth, changes in bowel habits especially diarrhea, admission to hospital for various reasons, and risks of death.   The patient is aware that the response rates discussed earlier is not guaranteed.    After a long discussion, patient made an informed decision to proceed.   Patient education material was dispensed.   Neck pain on right side The patient has tries variouss  combination of pain medicine. He felt that the oxycodone low-dose is best tolerated but the effect does not last long. I recommend a trial of long-acting OxyContin and he agreed to proceed   Orders Placed This Encounter  Procedures  . CBC with Differential    Standing Status: Standing     Number of Occurrences: 20     Standing Expiration Date: 08/27/2016  . Comprehensive metabolic panel    Standing Status: Standing     Number of Occurrences: 20     Standing Expiration Date: 08/27/2016  . TSH    Standing Status: Future     Number of Occurrences:      Standing Expiration Date: 09/30/2016   All questions were answered. The patient knows to call the clinic with any problems, questions or concerns. No barriers to learning was detected. I spent 40 minutes counseling the patient face to face. The total time spent in the appointment was 60 minutes and more than 50% was on counseling and review of test results     [facility], [employer], MD 08/27/2015 2:52 PM

## 2015-08-28 ENCOUNTER — Telehealth: Payer: Self-pay | Admitting: *Deleted

## 2015-08-28 ENCOUNTER — Other Ambulatory Visit: Payer: Self-pay | Admitting: Hematology and Oncology

## 2015-08-28 ENCOUNTER — Encounter: Payer: Self-pay | Admitting: Hematology and Oncology

## 2015-08-28 ENCOUNTER — Telehealth: Payer: Self-pay | Admitting: Hematology and Oncology

## 2015-08-28 NOTE — Telephone Encounter (Signed)
s.w. pt and advised on NOV appt....pt ok and aware °

## 2015-08-28 NOTE — Assessment & Plan Note (Signed)
He is not symptomatic. Observe only 

## 2015-08-28 NOTE — Telephone Encounter (Signed)
Per staff message and POF I have scheduled appts. Advised scheduler of appts. JMW  

## 2015-08-28 NOTE — Telephone Encounter (Signed)
  Oncology Nurse Navigator Documentation   Navigator Encounter Type: Telephone (08/28/15 1108)   Treatment Phase: Other (08/28/15 1108)       Spoke with [facility] [phone]), verbally requested copy of patient's 11/2 neck MRI, followed-up with fax request [phone]) to be sent to my attention.  She indicated copy should be received mid next week.  Gayleen Orem, RN, BSN, [place] at [place] [phone]               Time Spent with Patient: 15 (08/28/15 1108)

## 2015-09-02 ENCOUNTER — Other Ambulatory Visit: Payer: Self-pay | Admitting: Hematology and Oncology

## 2015-09-02 ENCOUNTER — Telehealth: Payer: Self-pay | Admitting: *Deleted

## 2015-09-02 ENCOUNTER — Ambulatory Visit (HOSPITAL_BASED_OUTPATIENT_CLINIC_OR_DEPARTMENT_OTHER): Payer: Medicaid Other

## 2015-09-02 ENCOUNTER — Other Ambulatory Visit: Payer: Medicaid Other

## 2015-09-02 ENCOUNTER — Other Ambulatory Visit (HOSPITAL_BASED_OUTPATIENT_CLINIC_OR_DEPARTMENT_OTHER): Payer: Medicaid Other

## 2015-09-02 VITALS — BP 101/51 | HR 83 | Temp 96.8°F | Resp 20

## 2015-09-02 DIAGNOSIS — Z79899 Other long term (current) drug therapy: Secondary | ICD-10-CM | POA: Diagnosis not present

## 2015-09-02 DIAGNOSIS — C119 Malignant neoplasm of nasopharynx, unspecified: Secondary | ICD-10-CM

## 2015-09-02 DIAGNOSIS — C7800 Secondary malignant neoplasm of unspecified lung: Secondary | ICD-10-CM

## 2015-09-02 DIAGNOSIS — Z5112 Encounter for antineoplastic immunotherapy: Secondary | ICD-10-CM

## 2015-09-02 DIAGNOSIS — C7802 Secondary malignant neoplasm of left lung: Secondary | ICD-10-CM

## 2015-09-02 LAB — CBC WITH DIFFERENTIAL/PLATELET
BASO%: 0.3 % (ref 0.0–2.0)
BASOS ABS: 0 10*3/uL (ref 0.0–0.1)
EOS ABS: 0.1 10*3/uL (ref 0.0–0.5)
EOS%: 1.7 % (ref 0.0–7.0)
HEMATOCRIT: 40 % (ref 38.4–49.9)
HEMOGLOBIN: 13.7 g/dL (ref 13.0–17.1)
LYMPH%: 23.4 % (ref 14.0–49.0)
MCH: 29.9 pg (ref 27.2–33.4)
MCHC: 34.3 g/dL (ref 32.0–36.0)
MCV: 87.3 fL (ref 79.3–98.0)
MONO#: 0.5 10*3/uL (ref 0.1–0.9)
MONO%: 7.7 % (ref 0.0–14.0)
NEUT#: 3.9 10*3/uL (ref 1.5–6.5)
NEUT%: 66.9 % (ref 39.0–75.0)
PLATELETS: 188 10*3/uL (ref 140–400)
RBC: 4.58 10*6/uL (ref 4.20–5.82)
RDW: 13.2 % (ref 11.0–14.6)
WBC: 5.9 10*3/uL (ref 4.0–10.3)
lymph#: 1.4 10*3/uL (ref 0.9–3.3)

## 2015-09-02 LAB — COMPREHENSIVE METABOLIC PANEL (CC13)
ALBUMIN: 4.3 g/dL (ref 3.5–5.0)
ALK PHOS: 67 U/L (ref 40–150)
ALT: 47 U/L (ref 0–55)
ANION GAP: 11 meq/L (ref 3–11)
AST: 44 U/L — ABNORMAL HIGH (ref 5–34)
BILIRUBIN TOTAL: 0.38 mg/dL (ref 0.20–1.20)
BUN: 10.7 mg/dL (ref 7.0–26.0)
CALCIUM: 10.1 mg/dL (ref 8.4–10.4)
CHLORIDE: 100 meq/L (ref 98–109)
CO2: 29 meq/L (ref 22–29)
Creatinine: 1.1 mg/dL (ref 0.7–1.3)
EGFR: >90 mL/min/{1.73_m2} (ref >90)
Glucose: 114 mg/dl (ref 70–140)
POTASSIUM: 3.6 meq/L (ref 3.5–5.1)
Sodium: 141 mEq/L (ref 136–145)
TOTAL PROTEIN: 7.8 g/dL (ref 6.4–8.3)

## 2015-09-02 LAB — TSH CHCC: TSH: 6.5 m(IU)/L — ABNORMAL HIGH (ref 0.320–4.118)

## 2015-09-02 MED ORDER — SODIUM CHLORIDE 0.9 % IV SOLN
Freq: Once | INTRAVENOUS | Status: AC
  Administered 2015-09-02: 15:00:00 via INTRAVENOUS

## 2015-09-02 MED ORDER — SODIUM CHLORIDE 0.9 % IV SOLN
240.0000 mg | Freq: Once | INTRAVENOUS | Status: AC
  Administered 2015-09-02: 240 mg via INTRAVENOUS
  Filled 2015-09-02: qty 20

## 2015-09-02 NOTE — Telephone Encounter (Signed)
  Oncology Nurse Navigator Documentation   Navigator Encounter Type: Telephone (09/02/15 1137)         Interventions: Coordination of Care (09/02/15 1137)     LVMM reminding patient of today's 2:00 lab and 2:30 infusion, requested he return my call to confirm understanding.  Gayleen Orem, RN, BSN, [place] at [place] [phone]           Time Spent with Patient: 15 (09/02/15 1137)

## 2015-09-02 NOTE — Patient Instructions (Signed)
[facility] Discharge Instructions for Patients Receiving Chemotherapy  Today you received the following chemotherapy agents:  Nivolumab  To help prevent nausea and vomiting after your treatment, we encourage you to take your nausea medication as ordered per MD.   If you develop nausea and vomiting that is not controlled by your nausea medication, call the clinic.   BELOW ARE SYMPTOMS THAT SHOULD BE REPORTED IMMEDIATELY:  *FEVER GREATER THAN 100.5 F  *CHILLS WITH OR WITHOUT FEVER  NAUSEA AND VOMITING THAT IS NOT CONTROLLED WITH YOUR NAUSEA MEDICATION  *UNUSUAL SHORTNESS OF BREATH  *UNUSUAL BRUISING OR BLEEDING  TENDERNESS IN MOUTH AND THROAT WITH OR WITHOUT PRESENCE OF ULCERS  *URINARY PROBLEMS  *BOWEL PROBLEMS  UNUSUAL RASH Items with * indicate a potential emergency and should be followed up as soon as possible.  Feel free to call the clinic you have any questions or concerns. The clinic phone number is [phone].  Please show the [facility] at check-in to the Emergency Department and triage nurse.   Nivolumab injection What is this medicine? NIVOLUMAB (nye VOL ue mab) is a monoclonal antibody. It is used to treat melanoma, lung cancer, kidney cancer, and Hodgkin lymphoma. This medicine may be used for other purposes; ask your health care provider or pharmacist if you have questions. What should I tell my health care provider before I take this medicine? They need to know if you have any of these conditions: -diabetes -immune system problems -kidney disease -liver disease -lung disease -organ transplant -stomach or intestine problems -thyroid disease -an unusual or allergic reaction to nivolumab, other medicines, foods, dyes, or preservatives -pregnant or trying to get pregnant -breast-feeding How should I use this medicine? This medicine is for infusion into a vein. It is given by a health care professional in a hospital or clinic  setting. A special MedGuide will be given to you before each treatment. Be sure to read this information carefully each time. Talk to your pediatrician regarding the use of this medicine in children. Special care may be needed. Overdosage: If you think you have taken too much of this medicine contact a poison control center or emergency room at once. NOTE: This medicine is only for you. Do not share this medicine with others. What if I miss a dose? It is important not to miss your dose. Call your doctor or health care professional if you are unable to keep an appointment. What may interact with this medicine? Interactions have not been studied. Give your health care provider a list of all the medicines, herbs, non-prescription drugs, or dietary supplements you use. Also tell them if you smoke, drink alcohol, or use illegal drugs. Some items may interact with your medicine. This list may not describe all possible interactions. Give your health care provider a list of all the medicines, herbs, non-prescription drugs, or dietary supplements you use. Also tell them if you smoke, drink alcohol, or use illegal drugs. Some items may interact with your medicine. What should I watch for while using this medicine? This drug may make you feel generally unwell. Continue your course of treatment even though you feel ill unless your doctor tells you to stop. You may need blood work done while you are taking this medicine. Do not become pregnant while taking this medicine or for 5 months after stopping it. Women should inform their doctor if they wish to become pregnant or think they might be pregnant. There is a potential for serious side  effects to an unborn child. Talk to your health care professional or pharmacist for more information. Do not breast-feed an infant while taking this medicine. What side effects may I notice from receiving this medicine? Side effects that you should report to your doctor or health  care professional as soon as possible: -allergic reactions like skin rash, itching or hives, swelling of the face, lips, or tongue -black, tarry stools -blood in the urine -bloody or watery diarrhea -changes in vision -change in sex drive -changes in emotions or moods -chest pain -confusion -cough -decreased appetite -diarrhea -facial flushing -feeling faint or lightheaded -fever, chills -hair loss -hallucination, loss of contact with reality -headache -irritable -joint pain -loss of memory -muscle pain -muscle weakness -seizures -shortness of breath -signs and symptoms of high blood sugar such as dizziness; dry mouth; dry skin; fruity breath; nausea; stomach pain; increased hunger or thirst; increased urination -signs and symptoms of kidney injury like trouble passing urine or change in the amount of urine -signs and symptoms of liver injury like dark yellow or brown urine; general ill feeling or flu-like symptoms; light-colored stools; loss of appetite; nausea; right upper belly pain; unusually weak or tired; yellowing of the eyes or skin -stiff neck -swelling of the ankles, feet, hands -weight gain Side effects that usually do not require medical attention (report to your doctor or health care professional if they continue or are bothersome): -bone pain -constipation -tiredness -vomiting This list may not describe all possible side effects. Call your doctor for medical advice about side effects. You may report side effects to FDA at 1-800-FDA-1088. Where should I keep my medicine? This drug is given in a hospital or clinic and will not be stored at home. NOTE: This sheet is a summary. It may not cover all possible information. If you have questions about this medicine, talk to your doctor, pharmacist, or health care provider.    2016, Elsevier/Gold Standard. (2015-02-20 10:03:42)

## 2015-09-03 ENCOUNTER — Inpatient Hospital Stay
Admission: RE | Admit: 2015-09-03 | Discharge: 2015-09-03 | Disposition: A | Discharge status: Home / Self Care | Payer: Self-pay | Source: Ambulatory Visit | Attending: Hematology and Oncology | Admitting: Hematology and Oncology

## 2015-09-03 ENCOUNTER — Encounter: Payer: Self-pay | Admitting: *Deleted

## 2015-09-03 ENCOUNTER — Other Ambulatory Visit: Payer: Self-pay | Admitting: Hematology and Oncology

## 2015-09-03 ENCOUNTER — Telehealth: Payer: Self-pay | Admitting: *Deleted

## 2015-09-03 ENCOUNTER — Other Ambulatory Visit: Payer: Self-pay | Admitting: *Deleted

## 2015-09-03 DIAGNOSIS — C801 Malignant (primary) neoplasm, unspecified: Secondary | ICD-10-CM

## 2015-09-03 MED ORDER — LEVOTHYROXINE SODIUM 100 MCG PO TABS: 100.0000 ug | ORAL_TABLET | Freq: Every day | ORAL | Status: DC

## 2015-09-03 NOTE — Telephone Encounter (Signed)
Pt states he has been taking his thyroid medicine. Will call in new dose.  Pt states he needs a letter for school stating he was in treatment yesterday. Missed an exam due to treatment.

## 2015-09-03 NOTE — Telephone Encounter (Signed)
-----   Message from San Morelle, RN sent at 09/02/2015  5:13 PM EST ----- Regarding: gorsuch-chemo f/u call First time Nivolumab-no difficulties

## 2015-09-03 NOTE — Telephone Encounter (Signed)
-----   Message from [name], MD sent at 09/03/2015  7:28 AM EST ----- Regarding: thyroid function TSH is still high Has he been taking his thyroid medicine? If not, remind him to take If yes, proceed to call in 100 mcg daily PO (increase dose) ----- Message -----    From: [facility] Interface    Sent: 09/02/2015   2:31 PM      To: [name], MD

## 2015-09-03 NOTE — Progress Notes (Signed)
  Oncology Nurse Navigator Documentation   Navigator Encounter Type: Other (09/03/15 7824)         Interventions: Coordination of Care (09/03/15 2353)       Received copy of patient's 08/07/15 neck MRI from [facility], delivered to [facility] for [facility] import.  Dr. Alvy Bimler informed.  Gayleen Orem, RN, BSN, [place] at [place] [phone]         Time Spent with Patient: 15 (09/03/15 0829)

## 2015-09-03 NOTE — Telephone Encounter (Signed)
Pt states he did well with treatment, no problems or questions

## 2015-09-09 ENCOUNTER — Telehealth: Payer: Self-pay | Admitting: *Deleted

## 2015-09-09 MED ORDER — SENNA 8.6 MG PO TABS: 2.0000 | ORAL_TABLET | Freq: Three times a day (TID) | ORAL | Status: DC | PRN

## 2015-09-09 MED ORDER — OXYCODONE HCL ER 15 MG PO T12A: 15.0000 mg | EXTENDED_RELEASE_TABLET | Freq: Three times a day (TID) | ORAL | Status: DC

## 2015-09-09 MED ORDER — POLYETHYLENE GLYCOL 3350 17 G PO PACK: 17.0000 g | PACK | Freq: Every day | ORAL | Status: DC

## 2015-09-09 NOTE — Telephone Encounter (Signed)
Pt called again regarding constipation.  States he keeps feeling like he has to go but he can't.  He used a fleets enema last week which helped but was "too aggressive" in his words. He asks if anything he can use that is not as strong?  He has not gone to pharmacy yet to pick up the Miralax and Senokot-S as directed in earlier phone call today.  Pt concerned that taking oral medication is not going to "unblock me."   Instructed pt to try the Miralax daily and take Senokot-s until he has BM.  He can try Glycerin Suppository in addition to the miralax and senokot-s.  Asked him to call nurse back tomorrow to see how he is doing and we can discuss plan to prevent constipation in the future. He verbalized understanding.

## 2015-09-09 NOTE — Telephone Encounter (Signed)
Dr. Alvy Bimler notified of pt c/o constipation,  No BM x 3 to 4 days.  She instructed for pt to take Miralax once a day and Senokot-S, 2 tabs up to three times a day until he has BM.  Informed pt of orders for constipation.  He verbalized understanding and asked for meds to be sent to his pharmacy.  Informed him they are OTC but will send as Rx in case pharmacy can fill as Rx.   Instructed him to call tomorrow if still no BM.   He also will need refill on Oxycontin,  States he is taking it three times daily and rarely taking the oxy ir.   He says this is helping his pain better than the IR.   He has ten pills left.  Rx for Oxycontin increased to TID per Dr. Alvy Bimler and ready for pt to pick up.

## 2015-09-09 NOTE — Telephone Encounter (Signed)
Voicemail from patient reporting he "received immunotherapy last week and heavily constipated.  Saw PCP who prescribed enema.  Some relief but now no BM in three days.  I'm eating well, I don't have the urge to go.  I've tried Maalox with no relief."  11:55 called patient who still reports no BM.  First Nivolumab received 09-02-2015.  Also reports history of "polyethylene glycol working better for bowels but has run out.  Denies N/V. Able to eat and drink but is not receiving 64 oz minimum.  Abdomen slightly bloated.  Denies pain.  Will notify provider as he may need to be seen or abdominal xray.   "Can I try the polyethylene glycol first?  Can this be called in to my pharmacy before any xrays?"   Will send refill and instructed to call with results.      [facility] has also called patient and will complete.

## 2015-09-16 ENCOUNTER — Telehealth: Payer: Self-pay | Admitting: Hematology and Oncology

## 2015-09-16 NOTE — Telephone Encounter (Signed)
returned call and s.w pt and confirmed 12.13 appt...Marland KitchenMarland KitchenMarland Kitchenpt ok and aware

## 2015-09-17 ENCOUNTER — Telehealth: Payer: Self-pay | Admitting: Hematology and Oncology

## 2015-09-17 ENCOUNTER — Ambulatory Visit (HOSPITAL_BASED_OUTPATIENT_CLINIC_OR_DEPARTMENT_OTHER): Payer: Medicaid Other

## 2015-09-17 ENCOUNTER — Other Ambulatory Visit (HOSPITAL_BASED_OUTPATIENT_CLINIC_OR_DEPARTMENT_OTHER): Payer: Medicaid Other

## 2015-09-17 ENCOUNTER — Encounter: Payer: Self-pay | Admitting: *Deleted

## 2015-09-17 ENCOUNTER — Ambulatory Visit (HOSPITAL_BASED_OUTPATIENT_CLINIC_OR_DEPARTMENT_OTHER): Payer: Medicaid Other | Admitting: Hematology and Oncology

## 2015-09-17 ENCOUNTER — Encounter: Payer: Self-pay | Admitting: Hematology and Oncology

## 2015-09-17 VITALS — BP 136/85 | HR 81 | Temp 98.2°F | Resp 18 | Ht 66.0 in | Wt 182.6 lb

## 2015-09-17 DIAGNOSIS — M542 Cervicalgia: Secondary | ICD-10-CM | POA: Diagnosis not present

## 2015-09-17 DIAGNOSIS — Z5112 Encounter for antineoplastic immunotherapy: Secondary | ICD-10-CM | POA: Diagnosis present

## 2015-09-17 DIAGNOSIS — C760 Malignant neoplasm of head, face and neck: Secondary | ICD-10-CM

## 2015-09-17 DIAGNOSIS — R5382 Chronic fatigue, unspecified: Secondary | ICD-10-CM | POA: Diagnosis not present

## 2015-09-17 DIAGNOSIS — C7800 Secondary malignant neoplasm of unspecified lung: Secondary | ICD-10-CM

## 2015-09-17 DIAGNOSIS — C119 Malignant neoplasm of nasopharynx, unspecified: Secondary | ICD-10-CM

## 2015-09-17 DIAGNOSIS — K5909 Other constipation: Secondary | ICD-10-CM | POA: Diagnosis not present

## 2015-09-17 LAB — CBC WITH DIFFERENTIAL/PLATELET
BASO%: 0.7 % (ref 0.0–2.0)
BASOS ABS: 0 10*3/uL (ref 0.0–0.1)
EOS ABS: 0.1 10*3/uL (ref 0.0–0.5)
EOS%: 1.4 % (ref 0.0–7.0)
HCT: 42.4 % (ref 38.4–49.9)
HGB: 14.1 g/dL (ref 13.0–17.1)
LYMPH#: 1.6 10*3/uL (ref 0.9–3.3)
LYMPH%: 27.9 % (ref 14.0–49.0)
MCH: 29.1 pg (ref 27.2–33.4)
MCHC: 33.3 g/dL (ref 32.0–36.0)
MCV: 87.4 fL (ref 79.3–98.0)
MONO#: 0.7 10*3/uL (ref 0.1–0.9)
MONO%: 11.4 % (ref 0.0–14.0)
NEUT#: 3.4 10*3/uL (ref 1.5–6.5)
NEUT%: 58.6 % (ref 39.0–75.0)
Platelets: 253 10*3/uL (ref 140–400)
RBC: 4.85 10*6/uL (ref 4.20–5.82)
RDW: 13.8 % (ref 11.0–14.6)
WBC: 5.9 10*3/uL (ref 4.0–10.3)

## 2015-09-17 LAB — COMPREHENSIVE METABOLIC PANEL
ALK PHOS: 71 U/L (ref 40–150)
ALT: 40 U/L (ref 0–55)
AST: 28 U/L (ref 5–34)
Albumin: 4.3 g/dL (ref 3.5–5.0)
Anion Gap: 8 mEq/L (ref 3–11)
BUN: 11.4 mg/dL (ref 7.0–26.0)
CALCIUM: 9.9 mg/dL (ref 8.4–10.4)
CHLORIDE: 104 meq/L (ref 98–109)
CO2: 28 mEq/L (ref 22–29)
Creatinine: 1.1 mg/dL (ref 0.7–1.3)
EGFR: >90 mL/min/{1.73_m2} (ref >90)
GLUCOSE: 104 mg/dL (ref 70–140)
POTASSIUM: 4.1 meq/L (ref 3.5–5.1)
SODIUM: 139 meq/L (ref 136–145)
Total Protein: 7.7 g/dL (ref 6.4–8.3)

## 2015-09-17 MED ORDER — SODIUM CHLORIDE 0.9 % IV SOLN
Freq: Once | INTRAVENOUS | Status: AC
  Administered 2015-09-17: 11:00:00 via INTRAVENOUS

## 2015-09-17 MED ORDER — SODIUM CHLORIDE 0.9 % IV SOLN
240.0000 mg | Freq: Once | INTRAVENOUS | Status: AC
  Administered 2015-09-17: 240 mg via INTRAVENOUS
  Filled 2015-09-17: qty 24

## 2015-09-17 MED ORDER — OXYCODONE HCL ER 15 MG PO T12A: 15.0000 mg | EXTENDED_RELEASE_TABLET | Freq: Three times a day (TID) | ORAL | Status: DC

## 2015-09-17 NOTE — Assessment & Plan Note (Signed)
He is not symptomatic. Observe only 

## 2015-09-17 NOTE — Telephone Encounter (Signed)
Gave and printed appt sched and avs for pt for Jan 2017 °

## 2015-09-17 NOTE — Assessment & Plan Note (Signed)
The patient is noncompliant with laxatives and his thyroid replacement therapy. I suspect the constipation is multifactorial from both. I recommend he takes laxatives regularly.

## 2015-09-17 NOTE — Assessment & Plan Note (Signed)
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 He is not compliant taking his thyroid medicine. I reinforced the importance of taking his thyroid replacement therapy regularly.

## 2015-09-17 NOTE — Progress Notes (Signed)
  Oncology Nurse Navigator Documentation   Navigator Encounter Type: Treatment (09/17/15 1105) Patient Visit Type: Medonc (09/17/15 1105)     Visited patient in Infusion to provide support and encouragement.  He presented with his usual positivity, indicated he feels that chemotherapy is working.   Though I no longer navigate him, he understands he can contact me if needed.  He expressed appreciation for my visit.  Gayleen Orem, RN, BSN, [facility] [place] [phone]                   Time Spent with Patient: 15 (09/17/15 1105)

## 2015-09-17 NOTE — Assessment & Plan Note (Signed)
Overall, he felt that the treatment has worked with reduced perception of pain and neck swelling. Due to the upcoming holidays, I would have to move his treatment Wednesdays. I will proceed with cycle 2 of treatment today without dose adjustment. After 4 cycles of treatment, I plan to repeat imaging study to assess response to treatment

## 2015-09-17 NOTE — Patient Instructions (Signed)
[facility] Discharge Instructions for Patients Receiving Chemotherapy  Today you received the following chemotherapy agents:  Nivolumab  To help prevent nausea and vomiting after your treatment, we encourage you to take your nausea medication as ordered per MD.   If you develop nausea and vomiting that is not controlled by your nausea medication, call the clinic.   BELOW ARE SYMPTOMS THAT SHOULD BE REPORTED IMMEDIATELY:  *FEVER GREATER THAN 100.5 F  *CHILLS WITH OR WITHOUT FEVER  NAUSEA AND VOMITING THAT IS NOT CONTROLLED WITH YOUR NAUSEA MEDICATION  *UNUSUAL SHORTNESS OF BREATH  *UNUSUAL BRUISING OR BLEEDING  TENDERNESS IN MOUTH AND THROAT WITH OR WITHOUT PRESENCE OF ULCERS  *URINARY PROBLEMS  *BOWEL PROBLEMS  UNUSUAL RASH Items with * indicate a potential emergency and should be followed up as soon as possible.  Feel free to call the clinic you have any questions or concerns. The clinic phone number is [phone].  Please show the [facility] at check-in to the Emergency Department and triage nurse.   Nivolumab injection What is this medicine? NIVOLUMAB (nye VOL ue mab) is a monoclonal antibody. It is used to treat melanoma, lung cancer, kidney cancer, and Hodgkin lymphoma. This medicine may be used for other purposes; ask your health care provider or pharmacist if you have questions. What should I tell my health care provider before I take this medicine? They need to know if you have any of these conditions: -diabetes -immune system problems -kidney disease -liver disease -lung disease -organ transplant -stomach or intestine problems -thyroid disease -an unusual or allergic reaction to nivolumab, other medicines, foods, dyes, or preservatives -pregnant or trying to get pregnant -breast-feeding How should I use this medicine? This medicine is for infusion into a vein. It is given by a health care professional in a hospital or clinic  setting. A special MedGuide will be given to you before each treatment. Be sure to read this information carefully each time. Talk to your pediatrician regarding the use of this medicine in children. Special care may be needed. Overdosage: If you think you have taken too much of this medicine contact a poison control center or emergency room at once. NOTE: This medicine is only for you. Do not share this medicine with others. What if I miss a dose? It is important not to miss your dose. Call your doctor or health care professional if you are unable to keep an appointment. What may interact with this medicine? Interactions have not been studied. Give your health care provider a list of all the medicines, herbs, non-prescription drugs, or dietary supplements you use. Also tell them if you smoke, drink alcohol, or use illegal drugs. Some items may interact with your medicine. This list may not describe all possible interactions. Give your health care provider a list of all the medicines, herbs, non-prescription drugs, or dietary supplements you use. Also tell them if you smoke, drink alcohol, or use illegal drugs. Some items may interact with your medicine. What should I watch for while using this medicine? This drug may make you feel generally unwell. Continue your course of treatment even though you feel ill unless your doctor tells you to stop. You may need blood work done while you are taking this medicine. Do not become pregnant while taking this medicine or for 5 months after stopping it. Women should inform their doctor if they wish to become pregnant or think they might be pregnant. There is a potential for serious side  effects to an unborn child. Talk to your health care professional or pharmacist for more information. Do not breast-feed an infant while taking this medicine. What side effects may I notice from receiving this medicine? Side effects that you should report to your doctor or health  care professional as soon as possible: -allergic reactions like skin rash, itching or hives, swelling of the face, lips, or tongue -black, tarry stools -blood in the urine -bloody or watery diarrhea -changes in vision -change in sex drive -changes in emotions or moods -chest pain -confusion -cough -decreased appetite -diarrhea -facial flushing -feeling faint or lightheaded -fever, chills -hair loss -hallucination, loss of contact with reality -headache -irritable -joint pain -loss of memory -muscle pain -muscle weakness -seizures -shortness of breath -signs and symptoms of high blood sugar such as dizziness; dry mouth; dry skin; fruity breath; nausea; stomach pain; increased hunger or thirst; increased urination -signs and symptoms of kidney injury like trouble passing urine or change in the amount of urine -signs and symptoms of liver injury like dark yellow or brown urine; general ill feeling or flu-like symptoms; light-colored stools; loss of appetite; nausea; right upper belly pain; unusually weak or tired; yellowing of the eyes or skin -stiff neck -swelling of the ankles, feet, hands -weight gain Side effects that usually do not require medical attention (report to your doctor or health care professional if they continue or are bothersome): -bone pain -constipation -tiredness -vomiting This list may not describe all possible side effects. Call your doctor for medical advice about side effects. You may report side effects to FDA at 1-800-FDA-1088. Where should I keep my medicine? This drug is given in a hospital or clinic and will not be stored at home. NOTE: This sheet is a summary. It may not cover all possible information. If you have questions about this medicine, talk to your doctor, pharmacist, or health care provider.    2016, Elsevier/Gold Standard. (2015-02-20 10:03:42)

## 2015-09-17 NOTE — Progress Notes (Signed)
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 Pan-endoscopy elsewhere showed cancer from nasopharyngeal space.   01/04/2007 - 02/20/2007 Chemotherapy He received 2 cycles of cisplatin and 5FU followed by concurrent chemo with weekly cisplatin and radiation. He only received 2 doses of chemo due to severe mucositis, nausea and weight loss.   04/05/2007 - 08/04/2007 Chemotherapy He received 4 more courses of cisplatin with 5FU and had complete response   07/05/2009 Procedure Fine-needle aspirate of the right level II lymph nodes come from recurrent metastatic disease. Repeat endoscopy and CT scan show no evidence of disease elsewhere.   07/08/2009 - 12/02/2009 Chemotherapy He was given 6 cycles of carboplatin, 5-FU and docetaxel   12/03/2009 Surgery He has surgery to the residual lymph node on the right neck which showed no evidence of disease.   02/22/2012 Imaging Repeat imaging study showed large recurrent mass. He was referred elsewhere for further treatment.   05/03/2012 Surgery He underwent left upper lobectomy.   04/29/2013 Imaging PEt scan  showed lesion on right level II B and lower lung was abnormal   06/03/2013 - 02/02/2014 Chemotherapy He had 6 cycles of chemotherapy when he was found to have recurrence of cancer and had received oxaliplatin and capecitabine   06/07/2014 Imaging PET CT scan showed persistent disease in the right neck lymph nodes and left lung   06/29/2014 Procedure Accession: [id number] repeat LUL biopsy confirmed metastatic cancer   07/18/2014 - 07/31/2014 Radiation Therapy He received palliative radiation therapy to the lungs   10/10/2014 Imaging CT scan of the chest, abdomen and pelvis show regression in the size of the lung nodule in the left upper lobe and stable pulmonary nodules   01/24/2015 Imaging CT scan showed stable disease in neck and lung   06/19/2015 Imaging CT scan of the neck and the chest show possible mild progression of the nodule in the right side of the neck.   06/25/2015 Imaging PET scan confirmed disease recurrence in the neck   07/07/2015 Imaging He had MRI neck [facility]   09/03/2015 -  Chemotherapy He received palliative chemo with Nivolumab    INTERVAL HISTORY: Please see below for problem oriented charting. He is seen prior to cycle 2 of treatment. He had intense right neck pain for a few days after treatment but now felt that the neck swelling and pain intensity have subsided. He is taking long acting OxyContin regularly and his pain is under better control. He developed recent severe constipation and fecal impaction, relieved with laxatives. He denies any cough, chest pain or shortness of breath. He complained of profound fatigue and admits he is  not compliant taking his thyroid replacement therapy.  REVIEW OF SYSTEMS:   Constitutional: Denies fevers, chills or abnormal weight loss Eyes: Denies blurriness of vision Ears, nose, mouth, throat, and face: Denies mucositis or sore throat Respiratory: Denies cough, dyspnea or wheezes Cardiovascular: Denies palpitation, chest discomfort or lower  extremity swelling Skin: Denies abnormal skin rashes Lymphatics: Denies new lymphadenopathy or easy bruising Neurological:Denies numbness, tingling or new weaknesses Behavioral/Psych: Mood is stable, no new changes  All other systems were reviewed with the patient and are negative.  I have reviewed the past medical history, past surgical history, social history and family history with the patient and they are unchanged from previous note.  ALLERGIES:  has No Known Allergies.  MEDICATIONS:  Current Outpatient Prescriptions  Medication Sig Dispense Refill  . diclofenac sodium (VOLTAREN) 1 % GEL Apply 2 g topically 2 (two) times daily as needed (Neck pain). 100 g 1  . levothyroxine (SYNTHROID) 100 MCG tablet Take 1 tablet (100 mcg total) by mouth daily before breakfast. 30 tablet 2  . lidocaine-prilocaine (EMLA) cream Apply 1 application topically as needed. 30 g 3  . oxyCODONE (OXYCONTIN) 15 mg 12 hr tablet Take 1 tablet (15 mg total) by mouth 3 (three) times daily. 90 tablet 0  . polyethylene glycol (MIRALAX) packet Take 17 g by mouth daily. 30 each 0  . senna (SENOKOT) 8.6 MG TABS tablet Take 2 tablets (17.2 mg total) by mouth 3 (three) times daily as needed for mild constipation. 120 each 0   No current facility-administered medications for this visit.    PHYSICAL EXAMINATION: ECOG PERFORMANCE STATUS: 1 - Symptomatic but completely ambulatory  Filed Vitals:   09/17/15 0935  BP: 136/85  Pulse: 81  Temp: 98.2 F (36.8 C)  Resp: 18   Filed Weights   09/17/15 0935  Weight: 182 lb 9.6 oz (82.827 kg)    GENERAL:alert, no distress and comfortable SKIN: skin color, texture, turgor are normal, no rashes or significant lesions EYES: normal, Conjunctiva are pink and non-injected, sclera clear OROPHARYNX:no exudate, no erythema and lips, buccal mucosa, and tongue normal  NECK: Significant surgical scar on the right side of his neck from prior radiation. I cannot appreciate any neck  swelling.  LYMPH:  no palpable lymphadenopathy in the cervical, axillary or inguinal LUNGS: clear to auscultation and percussion with normal breathing effort HEART: regular rate & rhythm and no murmurs and no lower extremity edema ABDOMEN:abdomen soft, non-tender and normal bowel sounds Musculoskeletal:no cyanosis of digits and no clubbing  NEURO: alert & oriented x 3 with fluent speech, no focal motor/sensory deficits  LABORATORY DATA:  I have reviewed the data as listed    Component Value Date/Time   NA 141 09/02/2015 1407   NA 141 06/29/2014 0655   K 3.6 09/02/2015 1407   K 4.4 06/29/2014 0655   CL 104 06/29/2014 0655   CO2 29 09/02/2015 1407   CO2 28 06/29/2014 0655   GLUCOSE 114 09/02/2015 1407   GLUCOSE 95 06/29/2014 0655   BUN 10.7 09/02/2015 1407   BUN 19 06/29/2014 0655   CREATININE 1.1 09/02/2015 1407   CREATININE 0.97 06/29/2014 0655   CALCIUM 10.1 09/02/2015 1407   CALCIUM 9.4 06/29/2014 0655   PROT 7.8 09/02/2015 1407   PROT 7.1 06/29/2014 0655   ALBUMIN 4.3 09/02/2015 1407   ALBUMIN 4.1 06/29/2014 0655   AST 44* 09/02/2015 1407   AST 29 06/29/2014 0655   ALT 47 09/02/2015 1407   ALT 24 06/29/2014 2637  ALKPHOS 67 09/02/2015 1407   ALKPHOS 47 06/29/2014 0655   BILITOT 0.38 09/02/2015 1407   BILITOT 0.3 06/29/2014 0655   GFRNONAA >90 06/29/2014 0655   GFRAA >90 06/29/2014 0655    No results found for: SPEP, UPEP  Lab Results  Component Value Date   WBC 5.9 09/17/2015   NEUTROABS 3.4 09/17/2015   HGB 14.1 09/17/2015   HCT 42.4 09/17/2015   MCV 87.4 09/17/2015   PLT 253 09/17/2015      Chemistry      Component Value Date/Time   NA 141 09/02/2015 1407   NA 141 06/29/2014 0655   K 3.6 09/02/2015 1407   K 4.4 06/29/2014 0655   CL 104 06/29/2014 0655   CO2 29 09/02/2015 1407   CO2 28 06/29/2014 0655   BUN 10.7 09/02/2015 1407   BUN 19 06/29/2014 0655   CREATININE 1.1 09/02/2015 1407   CREATININE 0.97 06/29/2014 0655      Component Value  Date/Time   CALCIUM 10.1 09/02/2015 1407   CALCIUM 9.4 06/29/2014 0655   ALKPHOS 67 09/02/2015 1407   ALKPHOS 47 06/29/2014 0655   AST 44* 09/02/2015 1407   AST 29 06/29/2014 0655   ALT 47 09/02/2015 1407   ALT 24 06/29/2014 0655   BILITOT 0.38 09/02/2015 1407   BILITOT 0.3 06/29/2014 0655      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[place]) Overall, he felt that the treatment has worked with reduced perception of pain and neck swelling. Due to the upcoming holidays, I would have to move his treatment Wednesdays. I will proceed with cycle 2 of treatment today without dose adjustment. After 4 cycles of treatment, I plan to repeat imaging study to assess response to treatment  Metastasis to lung He is not symptomatic. Observe only    Neck pain on right side The patient had good pain control with long-acting OxyContin. He takes it every 2-3 times a day with occasional breakthrough pain medicine. He developed constipation, resolved with laxatives. I refill his prescription today and recommend he takes laxatives regularly with pain medicine  Other constipation The patient is noncompliant with laxatives and his thyroid replacement therapy. I suspect the constipation is multifactorial from both. I recommend he takes laxatives regularly.  Acquired hypothyroidism He has acquired hypothyroidism from prior radiation and surgery to his neck. He is not compliant taking his thyroid medicine. I reinforced the importance of taking his thyroid replacement therapy regularly.  Chronic fatigue I suspect this is multifactorial from noncompliance of taking thyroid replacement therapy and mild sedative side effects from narcotics. His energy level is fair and again as above, I reinforced the importance of thyroid replacement therapy   No orders of the defined types were placed in this encounter.   All questions were answered. The patient knows to call the clinic with any problems,  questions or concerns. No barriers to learning was detected. I spent 25 minutes counseling the patient face to face. The total time spent in the appointment was 40 minutes and more than 50% was on counseling and review of test results     [facility], Wilson, MD 09/17/2015 9:53 AM

## 2015-09-17 NOTE — Assessment & Plan Note (Signed)
The patient had good pain control with long-acting OxyContin. He takes it every 2-3 times a day with occasional breakthrough pain medicine. He developed constipation, resolved with laxatives. I refill his prescription today and recommend he takes laxatives regularly with pain medicine

## 2015-09-17 NOTE — Assessment & Plan Note (Signed)
I suspect this is multifactorial from noncompliance of taking thyroid replacement therapy and mild sedative side effects from narcotics. His energy level is fair and again as above, I reinforced the importance of thyroid replacement therapy

## 2015-10-02 ENCOUNTER — Other Ambulatory Visit (HOSPITAL_BASED_OUTPATIENT_CLINIC_OR_DEPARTMENT_OTHER): Payer: Medicaid Other

## 2015-10-02 ENCOUNTER — Ambulatory Visit (HOSPITAL_BASED_OUTPATIENT_CLINIC_OR_DEPARTMENT_OTHER): Payer: Medicaid Other

## 2015-10-02 DIAGNOSIS — Z5112 Encounter for antineoplastic immunotherapy: Secondary | ICD-10-CM | POA: Diagnosis present

## 2015-10-02 DIAGNOSIS — C7802 Secondary malignant neoplasm of left lung: Secondary | ICD-10-CM | POA: Diagnosis not present

## 2015-10-02 DIAGNOSIS — C7800 Secondary malignant neoplasm of unspecified lung: Secondary | ICD-10-CM

## 2015-10-02 DIAGNOSIS — C760 Malignant neoplasm of head, face and neck: Secondary | ICD-10-CM

## 2015-10-02 DIAGNOSIS — C119 Malignant neoplasm of nasopharynx, unspecified: Secondary | ICD-10-CM

## 2015-10-02 LAB — COMPREHENSIVE METABOLIC PANEL
ALT: 53 U/L (ref 0–55)
AST: 43 U/L — AB (ref 5–34)
Albumin: 4.2 g/dL (ref 3.5–5.0)
Alkaline Phosphatase: 68 U/L (ref 40–150)
Anion Gap: 11 mEq/L (ref 3–11)
BUN: 14.8 mg/dL (ref 7.0–26.0)
CALCIUM: 9.6 mg/dL (ref 8.4–10.4)
CHLORIDE: 104 meq/L (ref 98–109)
CO2: 27 meq/L (ref 22–29)
CREATININE: 1.1 mg/dL (ref 0.7–1.3)
EGFR: >90 mL/min/{1.73_m2} (ref >90)
Glucose: 118 mg/dl (ref 70–140)
Potassium: 3.9 mEq/L (ref 3.5–5.1)
Sodium: 142 mEq/L (ref 136–145)
TOTAL PROTEIN: 7.7 g/dL (ref 6.4–8.3)
Total Bilirubin: 0.46 mg/dL (ref 0.20–1.20)

## 2015-10-02 LAB — CBC WITH DIFFERENTIAL/PLATELET
BASO%: 0.4 % (ref 0.0–2.0)
Basophils Absolute: 0 10*3/uL (ref 0.0–0.1)
EOS%: 2 % (ref 0.0–7.0)
Eosinophils Absolute: 0.1 10*3/uL (ref 0.0–0.5)
HEMATOCRIT: 39.6 % (ref 38.4–49.9)
HGB: 13.4 g/dL (ref 13.0–17.1)
LYMPH#: 1.1 10*3/uL (ref 0.9–3.3)
LYMPH%: 25.2 % (ref 14.0–49.0)
MCH: 30 pg (ref 27.2–33.4)
MCHC: 33.8 g/dL (ref 32.0–36.0)
MCV: 88.8 fL (ref 79.3–98.0)
MONO#: 0.4 10*3/uL (ref 0.1–0.9)
MONO%: 9.4 % (ref 0.0–14.0)
NEUT%: 63 % (ref 39.0–75.0)
NEUTROS ABS: 2.8 10*3/uL (ref 1.5–6.5)
Platelets: 184 10*3/uL (ref 140–400)
RBC: 4.46 10*6/uL (ref 4.20–5.82)
RDW: 13.4 % (ref 11.0–14.6)
WBC: 4.5 10*3/uL (ref 4.0–10.3)

## 2015-10-02 MED ORDER — SODIUM CHLORIDE 0.9 % IV SOLN
240.0000 mg | Freq: Once | INTRAVENOUS | Status: AC
  Administered 2015-10-02: 240 mg via INTRAVENOUS
  Filled 2015-10-02: qty 20

## 2015-10-02 MED ORDER — SODIUM CHLORIDE 0.9 % IV SOLN
Freq: Once | INTRAVENOUS | Status: AC
  Administered 2015-10-02: 11:00:00 via INTRAVENOUS

## 2015-10-02 NOTE — Patient Instructions (Signed)
[facility] Discharge Instructions for Patients Receiving Chemotherapy  Today you received the following chemotherapy agents:  Nivolumab.  To help prevent nausea and vomiting after your treatment, we encourage you to take your nausea medication as directed.   If you develop nausea and vomiting that is not controlled by your nausea medication, call the clinic.   BELOW ARE SYMPTOMS THAT SHOULD BE REPORTED IMMEDIATELY:  *FEVER GREATER THAN 100.5 F  *CHILLS WITH OR WITHOUT FEVER  NAUSEA AND VOMITING THAT IS NOT CONTROLLED WITH YOUR NAUSEA MEDICATION  *UNUSUAL SHORTNESS OF BREATH  *UNUSUAL BRUISING OR BLEEDING  TENDERNESS IN MOUTH AND THROAT WITH OR WITHOUT PRESENCE OF ULCERS  *URINARY PROBLEMS  *BOWEL PROBLEMS  UNUSUAL RASH Items with * indicate a potential emergency and should be followed up as soon as possible.  Feel free to call the clinic you have any questions or concerns. The clinic phone number is [phone].  Please show the CHEMO ALERT CARD at check-in to the Emergency Department and triage nurse.   

## 2015-10-08 ENCOUNTER — Telehealth: Payer: Self-pay | Admitting: *Deleted

## 2015-10-08 NOTE — Telephone Encounter (Signed)
VM forwarded from Triage.  Pt states needs a refill and also requesting some test results.  I called pt back and LVM x 2 informing pt I am returning his calls.  Asked him to call nurse back tomorrow.

## 2015-10-09 ENCOUNTER — Other Ambulatory Visit: Payer: Self-pay | Admitting: Hematology and Oncology

## 2015-10-09 ENCOUNTER — Telehealth: Payer: Self-pay | Admitting: *Deleted

## 2015-10-09 DIAGNOSIS — E039 Hypothyroidism, unspecified: Secondary | ICD-10-CM

## 2015-10-09 DIAGNOSIS — C760 Malignant neoplasm of head, face and neck: Secondary | ICD-10-CM

## 2015-10-09 MED ORDER — OXYCODONE HCL 15 MG PO TABS: 15.0000 mg | ORAL_TABLET | Freq: Four times a day (QID) | ORAL | Status: DC | PRN

## 2015-10-09 NOTE — Telephone Encounter (Signed)
Pt requests refill on Oxycodone 15 mg IR.  States he has been taking the Oxycontin 15 mg two to three times daily as prescribed.  Occasionally taking the Oxy IR as needed for increased pain.  Informed him Rx ready to pick up. Pt also asks if his Thyroid level was ok last week?  Informed him that it was not checked last week. Usually MD will wait a few months before rechecking TSH after a medication change.  Pt increased his Synthroid to 100 mcg daily last month and states he has been taking it every day.

## 2015-10-09 NOTE — Telephone Encounter (Signed)
I will add on to labs next week

## 2015-10-16 ENCOUNTER — Other Ambulatory Visit (HOSPITAL_BASED_OUTPATIENT_CLINIC_OR_DEPARTMENT_OTHER): Payer: Medicaid Other

## 2015-10-16 ENCOUNTER — Ambulatory Visit (HOSPITAL_BASED_OUTPATIENT_CLINIC_OR_DEPARTMENT_OTHER): Payer: Medicaid Other | Admitting: Hematology and Oncology

## 2015-10-16 ENCOUNTER — Other Ambulatory Visit: Payer: Self-pay | Admitting: Hematology and Oncology

## 2015-10-16 ENCOUNTER — Ambulatory Visit (HOSPITAL_BASED_OUTPATIENT_CLINIC_OR_DEPARTMENT_OTHER): Payer: Medicaid Other

## 2015-10-16 ENCOUNTER — Encounter: Payer: Self-pay | Admitting: Hematology and Oncology

## 2015-10-16 ENCOUNTER — Telehealth: Payer: Self-pay | Admitting: Hematology and Oncology

## 2015-10-16 VITALS — BP 128/77 | HR 96 | Temp 98.4°F | Resp 18 | Ht 66.0 in | Wt 187.0 lb

## 2015-10-16 DIAGNOSIS — Z5112 Encounter for antineoplastic immunotherapy: Secondary | ICD-10-CM

## 2015-10-16 DIAGNOSIS — C7802 Secondary malignant neoplasm of left lung: Secondary | ICD-10-CM

## 2015-10-16 DIAGNOSIS — E039 Hypothyroidism, unspecified: Secondary | ICD-10-CM

## 2015-10-16 DIAGNOSIS — Z119 Encounter for screening for infectious and parasitic diseases, unspecified: Secondary | ICD-10-CM | POA: Diagnosis not present

## 2015-10-16 DIAGNOSIS — K089 Disorder of teeth and supporting structures, unspecified: Secondary | ICD-10-CM

## 2015-10-16 DIAGNOSIS — C760 Malignant neoplasm of head, face and neck: Secondary | ICD-10-CM

## 2015-10-16 DIAGNOSIS — C7800 Secondary malignant neoplasm of unspecified lung: Secondary | ICD-10-CM

## 2015-10-16 DIAGNOSIS — C119 Malignant neoplasm of nasopharynx, unspecified: Secondary | ICD-10-CM

## 2015-10-16 DIAGNOSIS — R5382 Chronic fatigue, unspecified: Secondary | ICD-10-CM

## 2015-10-16 DIAGNOSIS — M542 Cervicalgia: Secondary | ICD-10-CM | POA: Diagnosis not present

## 2015-10-16 LAB — CBC WITH DIFFERENTIAL/PLATELET
BASO%: 0.4 % (ref 0.0–2.0)
Basophils Absolute: 0 10*3/uL (ref 0.0–0.1)
EOS ABS: 0.1 10*3/uL (ref 0.0–0.5)
EOS%: 1.4 % (ref 0.0–7.0)
HEMATOCRIT: 38.5 % (ref 38.4–49.9)
HGB: 13 g/dL (ref 13.0–17.1)
LYMPH#: 1.3 10*3/uL (ref 0.9–3.3)
LYMPH%: 23.6 % (ref 14.0–49.0)
MCH: 29.6 pg (ref 27.2–33.4)
MCHC: 33.8 g/dL (ref 32.0–36.0)
MCV: 87.7 fL (ref 79.3–98.0)
MONO#: 0.5 10*3/uL (ref 0.1–0.9)
MONO%: 9.3 % (ref 0.0–14.0)
NEUT%: 65.3 % (ref 39.0–75.0)
NEUTROS ABS: 3.7 10*3/uL (ref 1.5–6.5)
PLATELETS: 223 10*3/uL (ref 140–400)
RBC: 4.39 10*6/uL (ref 4.20–5.82)
RDW: 13.2 % (ref 11.0–14.6)
WBC: 5.7 10*3/uL (ref 4.0–10.3)

## 2015-10-16 LAB — COMPREHENSIVE METABOLIC PANEL
ALBUMIN: 4.1 g/dL (ref 3.5–5.0)
ALK PHOS: 63 U/L (ref 40–150)
ALT: 40 U/L (ref 0–55)
ANION GAP: 8 meq/L (ref 3–11)
AST: 32 U/L (ref 5–34)
BILIRUBIN TOTAL: 0.31 mg/dL (ref 0.20–1.20)
BUN: 14.9 mg/dL (ref 7.0–26.0)
CALCIUM: 9.5 mg/dL (ref 8.4–10.4)
CO2: 26 mEq/L (ref 22–29)
CREATININE: 1 mg/dL (ref 0.7–1.3)
Chloride: 106 mEq/L (ref 98–109)
Glucose: 92 mg/dl (ref 70–140)
Potassium: 3.9 mEq/L (ref 3.5–5.1)
Sodium: 140 mEq/L (ref 136–145)
TOTAL PROTEIN: 7.2 g/dL (ref 6.4–8.3)

## 2015-10-16 LAB — TSH: TSH: 1.891 m[IU]/L (ref 0.320–4.118)

## 2015-10-16 MED ORDER — PROMETHAZINE HCL 25 MG PO TABS: 25.0000 mg | ORAL_TABLET | Freq: Four times a day (QID) | ORAL | Status: DC | PRN

## 2015-10-16 MED ORDER — SODIUM CHLORIDE 0.9 % IV SOLN
240.0000 mg | Freq: Once | INTRAVENOUS | Status: AC
  Administered 2015-10-16: 240 mg via INTRAVENOUS
  Filled 2015-10-16: qty 24

## 2015-10-16 MED ORDER — LEVOTHYROXINE SODIUM 100 MCG PO TABS: 100.0000 ug | ORAL_TABLET | Freq: Every day | ORAL | Status: DC

## 2015-10-16 MED ORDER — SODIUM CHLORIDE 0.9 % IV SOLN
Freq: Once | INTRAVENOUS | Status: AC
  Administered 2015-10-16: 11:00:00 via INTRAVENOUS

## 2015-10-16 NOTE — Assessment & Plan Note (Signed)
Overall, he felt that the treatment has worked with reduced perception of pain and neck swelling. I will proceed with cycle 4 of treatment today without dose adjustment. After today's treatment, I plan to repeat imaging study to assess response to treatment

## 2015-10-16 NOTE — Progress Notes (Signed)
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 Pan-endoscopy elsewhere showed cancer from nasopharyngeal space.   01/04/2007 - 02/20/2007 Chemotherapy He received 2 cycles of cisplatin and 5FU followed by concurrent chemo with weekly cisplatin and radiation. He only received 2 doses of chemo due to severe mucositis, nausea and weight loss.   04/05/2007 - 08/04/2007 Chemotherapy He received 4 more courses of cisplatin with 5FU and had complete response   07/05/2009 Procedure Fine-needle aspirate of the right level II lymph nodes come from recurrent metastatic disease. Repeat endoscopy and CT scan show no evidence of disease elsewhere.   07/08/2009 - 12/02/2009 Chemotherapy He was given 6 cycles of carboplatin, 5-FU and docetaxel   12/03/2009 Surgery He has surgery to the residual lymph node on the right neck which showed no evidence of disease.   02/22/2012 Imaging Repeat imaging study showed large recurrent mass. He was referred elsewhere for further treatment.   05/03/2012 Surgery He underwent left upper lobectomy.   04/29/2013 Imaging PEt scan  showed lesion on right level II B and lower lung was abnormal   06/03/2013 - 02/02/2014 Chemotherapy He had 6 cycles of chemotherapy when he was found to have recurrence of cancer and had received oxaliplatin and capecitabine   06/07/2014 Imaging PET CT scan showed persistent disease in the right neck lymph nodes and left lung   06/29/2014 Procedure Accession: [id number] repeat LUL biopsy confirmed metastatic cancer   07/18/2014 - 07/31/2014 Radiation Therapy He received palliative radiation therapy to the lungs   10/10/2014 Imaging CT scan of the chest, abdomen and pelvis show regression in the size of the lung nodule in the left upper lobe and stable pulmonary nodules   01/24/2015 Imaging CT scan showed stable disease in neck and lung   06/19/2015 Imaging CT scan of the neck and the chest show possible mild progression of the nodule in the right side of the neck.   06/25/2015 Imaging PET scan confirmed disease recurrence in the neck   07/07/2015 Imaging He had MRI neck at [facility]   09/03/2015 -  Chemotherapy He received palliative chemo with Nivolumab    INTERVAL HISTORY: Please see below for problem oriented charting.  he is seen prior to cycle 4 of chemotherapy. He has improved energy and had develop some insomnia. His pain appears to be under control.  he denies constipation. He continues to have persistent chronic neck pain.  REVIEW OF SYSTEMS:   Constitutional: Denies fevers, chills or abnormal weight loss Eyes: Denies blurriness of vision Ears, nose, mouth, throat, and face: Denies mucositis or sore throat Respiratory: Denies cough, dyspnea or wheezes Cardiovascular: Denies palpitation,  chest discomfort or lower extremity swelling Gastrointestinal:  Denies nausea, heartburn or change in bowel habits Skin: Denies abnormal skin rashes Lymphatics: Denies new lymphadenopathy or easy bruising Neurological:Denies numbness, tingling or new weaknesses Behavioral/Psych: Mood is stable, no new changes   All other systems were reviewed with the patient and are negative.  I have reviewed the past medical history, past surgical history, social history and family history with the patient and they are unchanged from previous note.  ALLERGIES:  has No Known Allergies.  MEDICATIONS:  Current Outpatient Prescriptions  Medication Sig Dispense Refill  . diclofenac sodium (VOLTAREN) 1 % GEL Apply 2 g topically 2 (two) times daily as needed (Neck pain). 100 g 1  . levothyroxine (SYNTHROID) 100 MCG tablet Take 1 tablet (100 mcg total) by mouth daily before breakfast. 30 tablet 2  . lidocaine-prilocaine (EMLA) cream Apply 1 application topically as needed. 30 g 3  . oxyCODONE (OXYCONTIN) 15 mg 12 hr tablet Take 1 tablet (15 mg total) by mouth 3 (three) times daily. 90 tablet 0  . oxyCODONE (ROXICODONE) 15 MG immediate release tablet Take 1 tablet (15 mg total) by mouth every 6 (six) hours as needed for severe pain. 90 tablet 0  . polyethylene glycol (MIRALAX) packet Take 17 g by mouth daily. 30 each 0  . promethazine (PHENERGAN) 25 MG tablet Take 1 tablet (25 mg total) by mouth every 6 (six) hours as needed for nausea or vomiting. 30 tablet 3  . senna (SENOKOT) 8.6 MG TABS tablet Take 2 tablets (17.2 mg total) by mouth 3 (three) times daily as needed for mild constipation. 120 each 0   No current facility-administered medications for this visit.    PHYSICAL EXAMINATION: ECOG PERFORMANCE STATUS: 1 - Symptomatic but completely ambulatory  Filed Vitals:   10/16/15 1028  BP: 128/77  Pulse: 96  Temp: 98.4 F (36.9 C)  Resp: 18   Filed Weights   10/16/15 1028  Weight: 187 lb (84.823 kg)    GENERAL:alert, no distress and comfortable SKIN: skin color, texture, turgor are normal, no rashes or significant lesions EYES: normal, Conjunctiva are pink and non-injected, sclera clear OROPHARYNX:no exudate, no erythema and lips, buccal mucosa, and tongue normal  NECK:  His neck is fibrosed from prior  surgery and radiation. LYMPH:  no palpable lymphadenopathy in the cervical, axillary or inguinal LUNGS: clear to auscultation and percussion with normal breathing effort HEART: regular rate & rhythm and no murmurs and no lower extremity edema ABDOMEN:abdomen soft, non-tender and normal bowel sounds Musculoskeletal:no cyanosis of digits and no clubbing  NEURO: alert & oriented x 3 with fluent speech, no focal motor/sensory deficits  LABORATORY DATA:  I have reviewed the data as listed    Component Value Date/Time   NA 142 10/02/2015 0945   NA 141 06/29/2014 0655   K 3.9 10/02/2015 0945   K 4.4 06/29/2014 0655   CL 104 06/29/2014 0655   CO2 27 10/02/2015 0945   CO2 28 06/29/2014 0655   GLUCOSE 118 10/02/2015 0945   GLUCOSE 95 06/29/2014 0655   BUN 14.8 10/02/2015 0945   BUN 19 06/29/2014 0655   CREATININE 1.1 10/02/2015 0945   CREATININE 0.97 06/29/2014 0655   CALCIUM 9.6 10/02/2015 0945   CALCIUM 9.4 06/29/2014 0655   PROT 7.7 10/02/2015 0945   PROT 7.1 06/29/2014 0655   ALBUMIN 4.2 10/02/2015 0945   ALBUMIN 4.1 06/29/2014 0655   AST 43* 10/02/2015 0945   AST 29 06/29/2014 0655   ALT  53 10/02/2015 0945   ALT 24 06/29/2014 0655   ALKPHOS 68 10/02/2015 0945   ALKPHOS 47 06/29/2014 0655   BILITOT 0.46 10/02/2015 0945   BILITOT 0.3 06/29/2014 0655   GFRNONAA >90 06/29/2014 0655   GFRAA >90 06/29/2014 0655    No results found for: SPEP, UPEP  Lab Results  Component Value Date   WBC 5.7 10/16/2015   NEUTROABS 3.7 10/16/2015   HGB 13.0 10/16/2015   HCT 38.5 10/16/2015   MCV 87.7 10/16/2015   PLT 223 10/16/2015      Chemistry      Component Value Date/Time   NA 142 10/02/2015 0945   NA 141 06/29/2014 0655   K 3.9 10/02/2015 0945   K 4.4 06/29/2014 0655   CL 104 06/29/2014 0655   CO2 27 10/02/2015 0945   CO2 28 06/29/2014 0655   BUN 14.8 10/02/2015 0945   BUN 19 06/29/2014 0655   CREATININE 1.1 10/02/2015 0945   CREATININE 0.97 06/29/2014 0655       Component Value Date/Time   CALCIUM 9.6 10/02/2015 0945   CALCIUM 9.4 06/29/2014 0655   ALKPHOS 68 10/02/2015 0945   ALKPHOS 47 06/29/2014 0655   AST 43* 10/02/2015 0945   AST 29 06/29/2014 0655   ALT 53 10/02/2015 0945   ALT 24 06/29/2014 0655   BILITOT 0.46 10/02/2015 0945   BILITOT 0.3 06/29/2014 0655      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[place]) Overall, he felt that the treatment has worked with reduced perception of pain and neck swelling. I will proceed with cycle 4 of treatment today without dose adjustment. After today's treatment, I plan to repeat imaging study to assess response to treatment    Acquired hypothyroidism He has acquired hypothyroidism from prior radiation and surgery to his neck. He is compliant taking his thyroid medicine. I reinforced the importance of taking his thyroid replacement therapy regularly.  TSH is pending and I will adjust his medicine as needed.   Neck pain on right side The patient had good pain control with long-acting OxyContin. He takes it every 2-3 times a day with occasional breakthrough pain medicine. He had developed constipation, resolved with laxatives. Continue the same  Chronic fatigue  He has improved energy level recently when he started to become compliant with his thyroid supplement.  I will adjust his medicine as needed. He complained of insomnia but has poor sleep hygiene. I recommend he does not take naps during daytime if possible.  Poor dentition  He has a painful tooth requiring attention. I will get TEFL teacher to see if we can get him to see a dentist without dental insurance.   Orders Placed This Encounter  Procedures  . NM PET Image Restag (PS) Skull Base To Thigh    Standing Status: Future     Number of Occurrences:      Standing Expiration Date: 12/15/2016    Order Specific Question:  Reason for Exam (SYMPTOM  OR DIAGNOSIS REQUIRED)    Answer:  staging scan for  primary head & neck ca, assess response to Rx. Patient has neck mass and history of lung mets    Order Specific Question:  Preferred imaging location?    Answer:  [facility]   All questions were answered. The patient knows to call the clinic with any problems, questions or concerns. No barriers to learning was detected. I spent 25 minutes counseling the patient face to face. The  total time spent in the appointment was 30 minutes and more than 50% was on counseling and review of test results     [facility], Rosalyn Archambault, MD 10/16/2015 10:44 AM

## 2015-10-16 NOTE — Telephone Encounter (Signed)
Appointments complete per 1/11 pof - patient to get printout in inf area. [facility] will call re pet scan - patient informed.

## 2015-10-16 NOTE — Patient Instructions (Signed)
[facility] Discharge Instructions for Patients Receiving Chemotherapy  Today you received the following chemotherapy agents Nivolumab  To help prevent nausea and vomiting after your treatment, we encourage you to take your nausea medication as needed   If you develop nausea and vomiting that is not controlled by your nausea medication, call the clinic.   BELOW ARE SYMPTOMS THAT SHOULD BE REPORTED IMMEDIATELY:  *FEVER GREATER THAN 100.5 F  *CHILLS WITH OR WITHOUT FEVER  NAUSEA AND VOMITING THAT IS NOT CONTROLLED WITH YOUR NAUSEA MEDICATION  *UNUSUAL SHORTNESS OF BREATH  *UNUSUAL BRUISING OR BLEEDING  TENDERNESS IN MOUTH AND THROAT WITH OR WITHOUT PRESENCE OF ULCERS  *URINARY PROBLEMS  *BOWEL PROBLEMS  UNUSUAL RASH Items with * indicate a potential emergency and should be followed up as soon as possible.  Feel free to call the clinic you have any questions or concerns. The clinic phone number is [phone].  Please show the CHEMO ALERT CARD at check-in to the Emergency Department and triage nurse.   

## 2015-10-16 NOTE — Addendum Note (Signed)
Addended by: Ricarda Frame C on: 10/16/2015 10:57 AM   Modules accepted: Orders, Medications

## 2015-10-16 NOTE — Assessment & Plan Note (Signed)
He has improved energy level recently when he started to become compliant with his thyroid supplement.  I will adjust his medicine as needed. He complained of insomnia but has poor sleep hygiene. I recommend he does not take naps during daytime if possible.

## 2015-10-16 NOTE — Assessment & Plan Note (Signed)
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 He is compliant taking his thyroid medicine. I reinforced the importance of taking his thyroid replacement therapy regularly.  TSH is pending and I will adjust his medicine as needed.

## 2015-10-16 NOTE — Assessment & Plan Note (Signed)
The patient had good pain control with long-acting OxyContin. He takes it every 2-3 times a day with occasional breakthrough pain medicine. He had developed constipation, resolved with laxatives. Continue the same

## 2015-10-16 NOTE — Assessment & Plan Note (Signed)
He has a painful tooth requiring attention. I will get TEFL teacher to see if we can get him to see a dentist without dental insurance.

## 2015-10-23 ENCOUNTER — Telehealth: Payer: Self-pay | Admitting: *Deleted

## 2015-10-23 NOTE — Telephone Encounter (Signed)
"  I need to speak with Dr. Alvy Bimler nurse about the PET scan."  Call transferred to ext [phone].

## 2015-10-23 NOTE — Telephone Encounter (Signed)
Pt left VM states his PET scan is not scheduled until after his appt w/ Dr. Alvy Bimler,  But when I look at his schedule it is actually on 1/24- the day before his appt w/ Dr. Alvy Bimler on 1/25.  Instructed pt PET scheduled on Tuesday 1/24 at 2 pm.. Arrive at 1:30 pm [facility] Radiology Dept.  Nothing to eat or drink, including no candy/sugar for 6 hrs prior to exam.   Pt verbalized understanding.

## 2015-10-29 ENCOUNTER — Ambulatory Visit (HOSPITAL_COMMUNITY)
Admission: RE | Admit: 2015-10-29 | Discharge: 2015-10-29 | Disposition: A | Discharge status: Home / Self Care | Payer: Medicaid Other | Source: Ambulatory Visit | Attending: Hematology and Oncology | Admitting: Hematology and Oncology

## 2015-10-29 DIAGNOSIS — C7800 Secondary malignant neoplasm of unspecified lung: Secondary | ICD-10-CM | POA: Insufficient documentation

## 2015-10-29 DIAGNOSIS — J479 Bronchiectasis, uncomplicated: Secondary | ICD-10-CM | POA: Diagnosis not present

## 2015-10-29 DIAGNOSIS — C760 Malignant neoplasm of head, face and neck: Secondary | ICD-10-CM | POA: Insufficient documentation

## 2015-10-29 LAB — GLUCOSE, CAPILLARY: Glucose-Capillary: 91 mg/dL (ref 65–99)

## 2015-10-29 MED ORDER — FLUDEOXYGLUCOSE F - 18 (FDG) INJECTION
9.9000 | Freq: Once | INTRAVENOUS | Status: AC | PRN
  Administered 2015-10-29: 9.9 via INTRAVENOUS

## 2015-10-30 ENCOUNTER — Other Ambulatory Visit (HOSPITAL_BASED_OUTPATIENT_CLINIC_OR_DEPARTMENT_OTHER): Payer: Medicaid Other

## 2015-10-30 ENCOUNTER — Encounter: Payer: Self-pay | Admitting: Hematology and Oncology

## 2015-10-30 ENCOUNTER — Telehealth: Payer: Self-pay | Admitting: Hematology and Oncology

## 2015-10-30 ENCOUNTER — Ambulatory Visit (HOSPITAL_BASED_OUTPATIENT_CLINIC_OR_DEPARTMENT_OTHER): Payer: Medicaid Other

## 2015-10-30 ENCOUNTER — Ambulatory Visit (HOSPITAL_BASED_OUTPATIENT_CLINIC_OR_DEPARTMENT_OTHER): Payer: Medicaid Other | Admitting: Hematology and Oncology

## 2015-10-30 VITALS — BP 125/67 | HR 91 | Temp 97.5°F | Resp 18 | Wt 183.7 lb

## 2015-10-30 DIAGNOSIS — E039 Hypothyroidism, unspecified: Secondary | ICD-10-CM

## 2015-10-30 DIAGNOSIS — C760 Malignant neoplasm of head, face and neck: Secondary | ICD-10-CM

## 2015-10-30 DIAGNOSIS — C119 Malignant neoplasm of nasopharynx, unspecified: Secondary | ICD-10-CM

## 2015-10-30 DIAGNOSIS — C7802 Secondary malignant neoplasm of left lung: Secondary | ICD-10-CM

## 2015-10-30 DIAGNOSIS — M542 Cervicalgia: Secondary | ICD-10-CM | POA: Diagnosis not present

## 2015-10-30 DIAGNOSIS — C7800 Secondary malignant neoplasm of unspecified lung: Secondary | ICD-10-CM

## 2015-10-30 DIAGNOSIS — Z5112 Encounter for antineoplastic immunotherapy: Secondary | ICD-10-CM

## 2015-10-30 LAB — COMPREHENSIVE METABOLIC PANEL
ALK PHOS: 71 U/L (ref 40–150)
ALT: 40 U/L (ref 0–55)
ANION GAP: 9 meq/L (ref 3–11)
AST: 32 U/L (ref 5–34)
Albumin: 4.3 g/dL (ref 3.5–5.0)
BILIRUBIN TOTAL: 0.4 mg/dL (ref 0.20–1.20)
BUN: 13 mg/dL (ref 7.0–26.0)
CO2: 25 meq/L (ref 22–29)
Calcium: 9.7 mg/dL (ref 8.4–10.4)
Chloride: 104 mEq/L (ref 98–109)
Creatinine: 1 mg/dL (ref 0.7–1.3)
Glucose: 121 mg/dl (ref 70–140)
POTASSIUM: 3.9 meq/L (ref 3.5–5.1)
SODIUM: 139 meq/L (ref 136–145)
TOTAL PROTEIN: 7.6 g/dL (ref 6.4–8.3)

## 2015-10-30 LAB — CBC WITH DIFFERENTIAL/PLATELET
BASO%: 0.2 % (ref 0.0–2.0)
BASOS ABS: 0 10*3/uL (ref 0.0–0.1)
EOS ABS: 0.1 10*3/uL (ref 0.0–0.5)
EOS%: 1.4 % (ref 0.0–7.0)
HCT: 41.5 % (ref 38.4–49.9)
HGB: 14 g/dL (ref 13.0–17.1)
LYMPH%: 24.9 % (ref 14.0–49.0)
MCH: 29.2 pg (ref 27.2–33.4)
MCHC: 33.7 g/dL (ref 32.0–36.0)
MCV: 86.6 fL (ref 79.3–98.0)
MONO#: 0.4 10*3/uL (ref 0.1–0.9)
MONO%: 7.9 % (ref 0.0–14.0)
NEUT%: 65.6 % (ref 39.0–75.0)
NEUTROS ABS: 3.7 10*3/uL (ref 1.5–6.5)
PLATELETS: 205 10*3/uL (ref 140–400)
RBC: 4.79 10*6/uL (ref 4.20–5.82)
RDW: 13.4 % (ref 11.0–14.6)
WBC: 5.6 10*3/uL (ref 4.0–10.3)
lymph#: 1.4 10*3/uL (ref 0.9–3.3)

## 2015-10-30 MED ORDER — SODIUM CHLORIDE 0.9 % IV SOLN
240.0000 mg | Freq: Once | INTRAVENOUS | Status: AC
  Administered 2015-10-30: 240 mg via INTRAVENOUS
  Filled 2015-10-30: qty 20

## 2015-10-30 MED ORDER — OXYCODONE HCL ER 15 MG PO T12A
15.0000 mg | EXTENDED_RELEASE_TABLET | Freq: Two times a day (BID) | ORAL | Status: DC
Start: 2015-10-30 — End: 2015-12-25

## 2015-10-30 MED ORDER — LIDOCAINE-PRILOCAINE 2.5-2.5 % EX CREA: 1.0000 "application " | TOPICAL_CREAM | CUTANEOUS | Status: DC | PRN

## 2015-10-30 MED ORDER — SODIUM CHLORIDE 0.9 % IV SOLN
Freq: Once | INTRAVENOUS | Status: AC
  Administered 2015-10-30: 12:00:00 via INTRAVENOUS

## 2015-10-30 NOTE — Progress Notes (Signed)
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 Pan-endoscopy elsewhere showed cancer from nasopharyngeal space.   01/04/2007 - 02/20/2007 Chemotherapy He received 2 cycles of cisplatin and 5FU followed by concurrent chemo with weekly cisplatin and radiation. He only received 2 doses of chemo due to severe mucositis, nausea and weight loss.   04/05/2007 - 08/04/2007 Chemotherapy He received 4 more courses of cisplatin with 5FU and had complete response   07/05/2009 Procedure Fine-needle aspirate of the right level II lymph nodes come from recurrent metastatic disease. Repeat endoscopy and CT scan show no evidence of disease elsewhere.   07/08/2009 - 12/02/2009 Chemotherapy He was given 6 cycles of carboplatin, 5-FU and docetaxel   12/03/2009 Surgery He has surgery to the residual lymph node on the right neck which showed no evidence of disease.   02/22/2012 Imaging Repeat imaging study showed large recurrent mass. He was referred elsewhere for further treatment.   05/03/2012 Surgery He underwent left upper lobectomy.   04/29/2013 Imaging PEt scan  showed lesion on right level II B and lower lung was abnormal   06/03/2013 - 02/02/2014 Chemotherapy He had 6 cycles of chemotherapy when he was found to have recurrence of cancer and had received oxaliplatin and capecitabine   06/07/2014 Imaging PET CT scan showed persistent disease in the right neck lymph nodes and left lung   06/29/2014 Procedure Accession: [id number] repeat LUL biopsy confirmed metastatic cancer   07/18/2014 - 07/31/2014 Radiation Therapy He received palliative radiation therapy to the lungs   10/10/2014 Imaging CT scan of the chest, abdomen and pelvis show regression in the size of the lung nodule in the left upper lobe and stable pulmonary nodules   01/24/2015 Imaging CT scan showed stable disease in neck and lung   06/19/2015 Imaging CT scan of the neck and the chest show possible mild progression of the nodule in the right side of the neck.   06/25/2015 Imaging PET scan confirmed disease recurrence in the neck   07/07/2015 Imaging He had MRI neck at [facility]   09/03/2015 -  Chemotherapy He received palliative chemo with Nivolumab   10/29/2015 Imaging PET CT showed positive response to Rx    INTERVAL HISTORY: Please see below for problem oriented charting.  he is seen prior to cycle 5 of treatment. He tolerated treatment well. His pain is under excellent control. He had some constipation, resolved with laxatives.  his wife is go into labor and they are expecting a new child in the new future.  REVIEW OF SYSTEMS:   Constitutional: Denies fevers, chills or abnormal weight loss Eyes: Denies blurriness of vision Ears, nose,  mouth, throat, and face: Denies mucositis or sore throat Respiratory: Denies cough, dyspnea or wheezes Cardiovascular: Denies palpitation, chest discomfort or lower extremity swelling Gastrointestinal:  Denies nausea, heartburn or change in bowel habits Skin: Denies abnormal skin rashes Lymphatics: Denies new lymphadenopathy or easy bruising Neurological:Denies  numbness, tingling or new weaknesses Behavioral/Psych: Mood is stable, no new changes  All other systems were reviewed with the patient and are negative.  I have reviewed the past medical history, past surgical history, social history and family history with the patient and they are unchanged from previous note.  ALLERGIES:  has No Known Allergies.  MEDICATIONS:  Current Outpatient Prescriptions  Medication Sig Dispense Refill  . levothyroxine (SYNTHROID) 100 MCG tablet Take 1 tablet (100 mcg total) by mouth daily before breakfast. 30 tablet 2  . lidocaine-prilocaine (EMLA) cream Apply 1 application topically as needed. 30 g 3  . oxyCODONE (OXYCONTIN) 15 mg 12 hr tablet Take 1 tablet (15 mg total) by mouth every 12 (twelve) hours. 60 tablet 0  . oxyCODONE (ROXICODONE) 15 MG immediate release tablet Take 1 tablet (15 mg total) by mouth every 6 (six) hours as needed for severe pain. 90 tablet 0  . polyethylene glycol (MIRALAX) packet Take 17 g by mouth daily. 30 each 0  . promethazine (PHENERGAN) 25 MG tablet Take 1 tablet (25 mg total) by mouth every 6 (six) hours as needed for nausea or vomiting. 30 tablet 3   No current facility-administered medications for this visit.    PHYSICAL EXAMINATION: ECOG PERFORMANCE STATUS: 0 - Asymptomatic  Filed Vitals:   10/30/15 1052  BP: 125/67  Pulse: 91  Temp: 97.5 F (36.4 C)  Resp: 18   Filed Weights   10/30/15 1052  Weight: 183 lb 11.2 oz (83.326 kg)    GENERAL:alert, no distress and comfortable SKIN: skin color, texture, turgor are normal, no rashes or significant lesions EYES: normal, Conjunctiva are pink and non-injected, sclera clear OROPHARYNX:no exudate, no erythema and lips, buccal mucosa, and tongue normal  NECK: supple, thyroid normal size, non-tender, without nodularity LYMPH:  no palpable lymphadenopathy in the cervical, axillary or inguinal LUNGS: clear to auscultation and percussion with normal breathing effort HEART:  regular rate & rhythm and no murmurs and no lower extremity edema ABDOMEN:abdomen soft, non-tender and normal bowel sounds Musculoskeletal:no cyanosis of digits and no clubbing  NEURO: alert & oriented x 3 with fluent speech, no focal motor/sensory deficits  LABORATORY DATA:  I have reviewed the data as listed    Component Value Date/Time   NA 140 10/16/2015 1000   NA 141 06/29/2014 0655   K 3.9 10/16/2015 1000   K 4.4 06/29/2014 0655   CL 104 06/29/2014 0655   CO2 26 10/16/2015 1000   CO2 28 06/29/2014 0655   GLUCOSE 92 10/16/2015 1000   GLUCOSE 95 06/29/2014 0655   BUN 14.9 10/16/2015 1000   BUN 19 06/29/2014 0655   CREATININE 1.0 10/16/2015 1000   CREATININE 0.97 06/29/2014 0655   CALCIUM 9.5 10/16/2015 1000   CALCIUM 9.4 06/29/2014 0655   PROT 7.2 10/16/2015 1000   PROT 7.1 06/29/2014 0655   ALBUMIN 4.1 10/16/2015 1000   ALBUMIN 4.1 06/29/2014 0655   AST 32 10/16/2015 1000   AST 29 06/29/2014 0655   ALT 40 10/16/2015 1000   ALT 24 06/29/2014 0655   ALKPHOS 63 10/16/2015 1000   ALKPHOS 47 06/29/2014 0655   BILITOT 0.31 10/16/2015 1000   BILITOT 0.3 06/29/2014 0655   GFRNONAA >90  06/29/2014 0655   GFRAA >90 06/29/2014 0655    No results found for: SPEP, UPEP  Lab Results  Component Value Date   WBC 5.6 10/30/2015   NEUTROABS 3.7 10/30/2015   HGB 14.0 10/30/2015   HCT 41.5 10/30/2015   MCV 86.6 10/30/2015   PLT 205 10/30/2015      Chemistry      Component Value Date/Time   NA 140 10/16/2015 1000   NA 141 06/29/2014 0655   K 3.9 10/16/2015 1000   K 4.4 06/29/2014 0655   CL 104 06/29/2014 0655   CO2 26 10/16/2015 1000   CO2 28 06/29/2014 0655   BUN 14.9 10/16/2015 1000   BUN 19 06/29/2014 0655   CREATININE 1.0 10/16/2015 1000   CREATININE 0.97 06/29/2014 0655      Component Value Date/Time   CALCIUM 9.5 10/16/2015 1000   CALCIUM 9.4 06/29/2014 0655   ALKPHOS 63 10/16/2015 1000   ALKPHOS 47 06/29/2014 0655   AST 32 10/16/2015 1000   AST 29  06/29/2014 0655   ALT 40 10/16/2015 1000   ALT 24 06/29/2014 0655   BILITOT 0.31 10/16/2015 1000   BILITOT 0.3 06/29/2014 0655       RADIOGRAPHIC STUDIES: I have personally reviewed the radiological images as listed and agreed with the findings in the report. Nm Pet Image Restag (ps) Skull Base To Thigh  10/29/2015  CLINICAL DATA:  Subsequent treatment strategy for head neck carcinoma. Assess response to therapy. EXAM: NUCLEAR MEDICINE PET SKULL BASE TO THIGH TECHNIQUE: 9.9 mCi F-18 FDG was injected intravenously. Full-ring PET imaging was performed from the skull base to thigh after the radiotracer. CT data was obtained and used for attenuation correction and anatomic localization. FASTING BLOOD GLUCOSE:  Value: 100 mg/dl COMPARISON:  PET-CT 06/25/2015 FINDINGS: NECK Hypermetabolic post auricular mass is decreased in metabolic activity with SUV max equal 8.1 decreased from 14.7. Lesion is also decreased in size measuring 19 mm decreased from 22 mm. Visually lesion is decreased in size. Small High LEFT level 4/level 3 lymph node (image 34 fused data set) with SUV max equal 3.2 compared to 3.6 on prior for no significant change. CHEST Consolidation in the LEFT upper lobe with bronchiectasis measures 3.5 cm similar prior. Mild metabolic activity (SUV max 2.6 compared to SUV max 2.9) 4 mm LEFT lower lobe nodule on image 73, series 4 compares to 5 mm on prior no change Single very small hypermetabolic RIGHT axial lymph node on image normal 59, series 606. ABDOMEN/PELVIS Intense metabolic activity associated with the stomach is increased prior. No focal lesion. No abnormal metabolic activity liver. No hypermetabolic abdominal pelvic lymph nodes. SKELETON No focal hypermetabolic activity to suggest skeletal metastasis. IMPRESSION: 1. Persistent hypermetabolic mass posterior to the RIGHT ear. The mass is decreased significantly in metabolic activity and size. 2. New small hypermetabolic LEFT level 3 lymph node  is unchanged. 3. Focus of consolidation in the LEFT upper lobe with low metabolic activity is similar to prior most consistent with post therapy inflammation. 4. Single hypermetabolic RIGHT axillary lymph node with normal morphology is likely reactive. 5. New activity associated with the stomach likely represents gastritis or physiologic muscle activity Electronically Signed   By: Suzy Bouchard M.D.   On: 10/29/2015 16:50     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[place])  He had excellent response to treatment.  we will continue Nivolumab every 2 weeks for another 3 months before repeating imaging study.  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 Observe only    Acquired hypothyroidism He has acquired hypothyroidism from prior radiation and surgery to his neck. He is compliant taking his thyroid medicine. I reinforced the importance of taking his thyroid replacement therapy regularly.     Neck pain on right side The patient had good pain control with long-acting OxyContin. He takes it every 2-3 times a day with occasional breakthrough pain medicine. He had developed constipation, resolved with laxatives. Continue the same I refilled his medications today   No orders of the defined types were placed in this encounter.   All questions were answered. The patient knows to call the clinic with any problems, questions or concerns. No barriers to learning was detected. I spent 25 minutes counseling the patient face to face. The total time spent in the appointment was 30 minutes and more than 50% was on counseling and review of test results     [facility], [place], MD 10/30/2015 11:14 AM

## 2015-10-30 NOTE — Patient Instructions (Signed)
[facility] Discharge Instructions for Patients Receiving Chemotherapy  Today you received the following chemotherapy agents Nivolumab.  To help prevent nausea and vomiting after your treatment, we encourage you to take your nausea medication as prescribed.   If you develop nausea and vomiting that is not controlled by your nausea medication, call the clinic.   BELOW ARE SYMPTOMS THAT SHOULD BE REPORTED IMMEDIATELY:  *FEVER GREATER THAN 100.5 F  *CHILLS WITH OR WITHOUT FEVER  NAUSEA AND VOMITING THAT IS NOT CONTROLLED WITH YOUR NAUSEA MEDICATION  *UNUSUAL SHORTNESS OF BREATH  *UNUSUAL BRUISING OR BLEEDING  TENDERNESS IN MOUTH AND THROAT WITH OR WITHOUT PRESENCE OF ULCERS  *URINARY PROBLEMS  *BOWEL PROBLEMS  UNUSUAL RASH Items with * indicate a potential emergency and should be followed up as soon as possible.  Feel free to call the clinic you have any questions or concerns. The clinic phone number is [phone].  Please show the CHEMO ALERT CARD at check-in to the Emergency Department and triage nurse.   

## 2015-10-30 NOTE — Assessment & Plan Note (Signed)
The patient had good pain control with long-acting OxyContin. He takes it every 2-3 times a day with occasional breakthrough pain medicine. He had developed constipation, resolved with laxatives. Continue the same I refilled his medications today

## 2015-10-30 NOTE — Assessment & Plan Note (Signed)
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 He is compliant taking his thyroid medicine. I reinforced the importance of taking his thyroid replacement therapy regularly.

## 2015-10-30 NOTE — Telephone Encounter (Signed)
Gv pt appts for Feb 2017.

## 2015-10-30 NOTE — Assessment & Plan Note (Signed)
He had excellent response to treatment.  we will continue Nivolumab every 2 weeks for another 3 months before repeating imaging study.

## 2015-10-30 NOTE — Assessment & Plan Note (Signed)
He is not symptomatic. Observe only 

## 2015-11-03 IMAGING — CR DG CHEST 2V
2 series · 2 of 2 positions shown · non-contrast
Comparison: None.

CLINICAL DATA: Cavitating mass in the left upper lobe, preop.

EXAM:
CHEST  2 VIEW

[w chest pa]
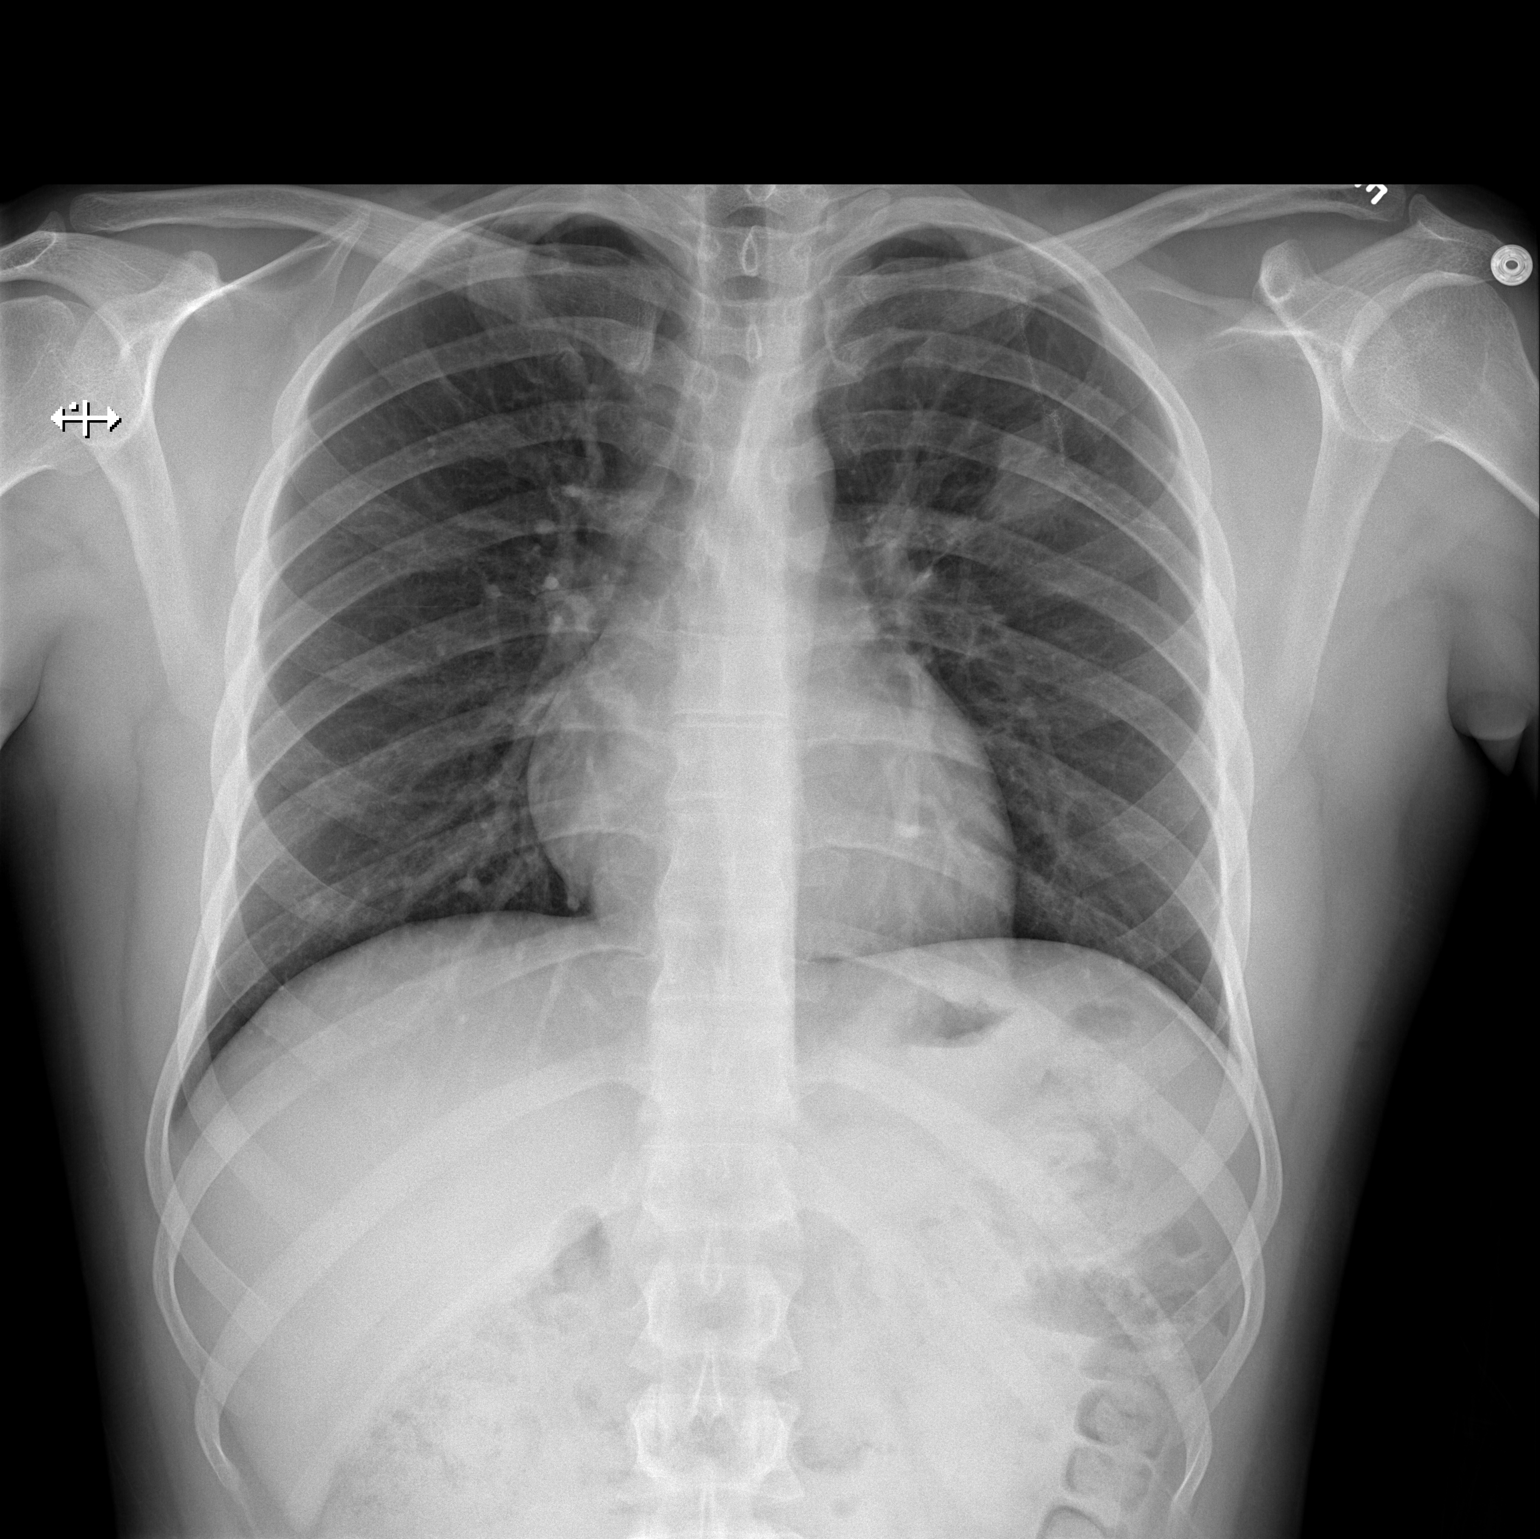

[w chest lat]
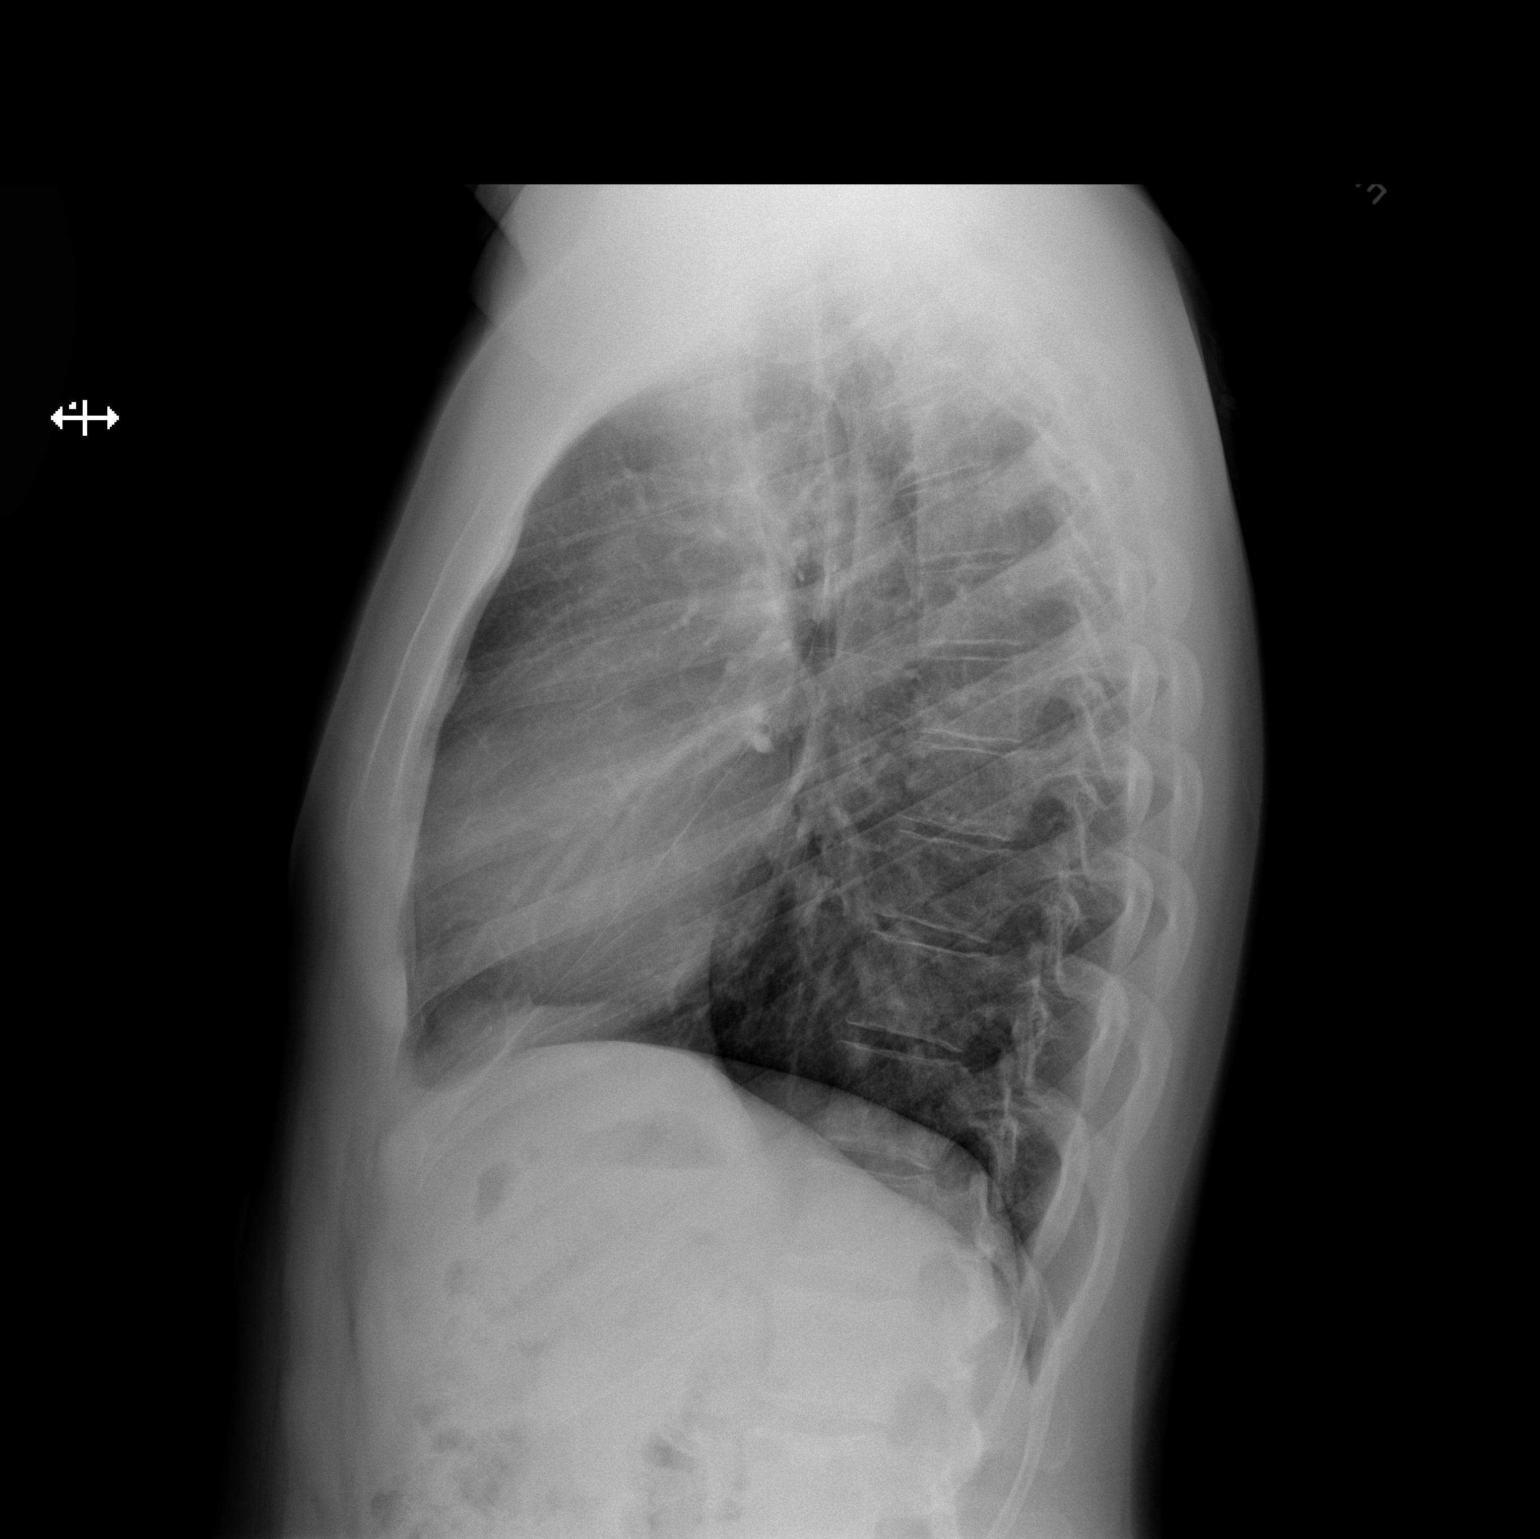

[2 of 2 positions shown; findings below may reference images not displayed]

FINDINGS: Left upper lobe tumor noted neighboring lung suture line, size
underestimated compared to CT. No new nodule seen. No active
cardiopulmonary disease such as edema, pneumonia, effusion, or
pneumothorax. Normal heart size and mediastinal contours.
IMPRESSION: No acute cardiopulmonary disease.

## 2015-11-04 ENCOUNTER — Telehealth: Payer: Self-pay | Admitting: *Deleted

## 2015-11-04 NOTE — Telephone Encounter (Signed)
Pt Christian Simmons states he can't remember when he has appt w/ Dr. Vicie Mutters at [facility] and he can't find his phone number.   I called pt back and left VM giving him the phone number for Dr. Waynard Edwards office at [facility].

## 2015-11-13 ENCOUNTER — Ambulatory Visit (HOSPITAL_BASED_OUTPATIENT_CLINIC_OR_DEPARTMENT_OTHER): Payer: Medicaid Other

## 2015-11-13 ENCOUNTER — Other Ambulatory Visit (HOSPITAL_BASED_OUTPATIENT_CLINIC_OR_DEPARTMENT_OTHER): Payer: Medicaid Other

## 2015-11-13 VITALS — BP 117/62 | HR 86 | Temp 96.9°F | Resp 16

## 2015-11-13 DIAGNOSIS — Z5112 Encounter for antineoplastic immunotherapy: Secondary | ICD-10-CM

## 2015-11-13 DIAGNOSIS — C7802 Secondary malignant neoplasm of left lung: Secondary | ICD-10-CM

## 2015-11-13 DIAGNOSIS — C7800 Secondary malignant neoplasm of unspecified lung: Secondary | ICD-10-CM

## 2015-11-13 DIAGNOSIS — C119 Malignant neoplasm of nasopharynx, unspecified: Secondary | ICD-10-CM

## 2015-11-13 DIAGNOSIS — C760 Malignant neoplasm of head, face and neck: Secondary | ICD-10-CM

## 2015-11-13 LAB — CBC WITH DIFFERENTIAL/PLATELET
BASO%: 0.3 % (ref 0.0–2.0)
BASOS ABS: 0 10*3/uL (ref 0.0–0.1)
EOS ABS: 0.1 10*3/uL (ref 0.0–0.5)
EOS%: 1.3 % (ref 0.0–7.0)
HEMATOCRIT: 42 % (ref 38.4–49.9)
HEMOGLOBIN: 14.2 g/dL (ref 13.0–17.1)
LYMPH#: 1.3 10*3/uL (ref 0.9–3.3)
LYMPH%: 21.6 % (ref 14.0–49.0)
MCH: 29.6 pg (ref 27.2–33.4)
MCHC: 33.8 g/dL (ref 32.0–36.0)
MCV: 87.5 fL (ref 79.3–98.0)
MONO#: 0.6 10*3/uL (ref 0.1–0.9)
MONO%: 9.5 % (ref 0.0–14.0)
NEUT%: 67.3 % (ref 39.0–75.0)
NEUTROS ABS: 4.2 10*3/uL (ref 1.5–6.5)
NRBC: 0 % (ref 0–0)
PLATELETS: 169 10*3/uL (ref 140–400)
RBC: 4.8 10*6/uL (ref 4.20–5.82)
RDW: 13.6 % (ref 11.0–14.6)
WBC: 6.2 10*3/uL (ref 4.0–10.3)

## 2015-11-13 LAB — COMPREHENSIVE METABOLIC PANEL
ALBUMIN: 4.2 g/dL (ref 3.5–5.0)
ALK PHOS: 66 U/L (ref 40–150)
ALT: 35 U/L (ref 0–55)
ANION GAP: 12 meq/L — AB (ref 3–11)
AST: 29 U/L (ref 5–34)
BILIRUBIN TOTAL: 0.35 mg/dL (ref 0.20–1.20)
BUN: 15.1 mg/dL (ref 7.0–26.0)
CO2: 21 meq/L — AB (ref 22–29)
CREATININE: 0.9 mg/dL (ref 0.7–1.3)
Calcium: 9.5 mg/dL (ref 8.4–10.4)
Chloride: 107 mEq/L (ref 98–109)
EGFR: >90 mL/min/{1.73_m2} (ref >90)
GLUCOSE: 110 mg/dL (ref 70–140)
Potassium: 3.9 mEq/L (ref 3.5–5.1)
Sodium: 139 mEq/L (ref 136–145)
TOTAL PROTEIN: 7.4 g/dL (ref 6.4–8.3)

## 2015-11-13 MED ORDER — NIVOLUMAB CHEMO INJECTION 100 MG/10ML
240.0000 mg | Freq: Once | INTRAVENOUS | Status: AC
  Administered 2015-11-13: 240 mg via INTRAVENOUS
  Filled 2015-11-13: qty 8

## 2015-11-13 MED ORDER — SODIUM CHLORIDE 0.9 % IV SOLN
Freq: Once | INTRAVENOUS | Status: AC
  Administered 2015-11-13: 10:00:00 via INTRAVENOUS

## 2015-11-13 NOTE — Patient Instructions (Addendum)
[facility] Discharge Instructions for Patients Receiving Chemotherapy  Today you received the following chemotherapy agents Nivolumab.  To help prevent nausea and vomiting after your treatment, we encourage you to take your nausea medication as prescribed.   If you develop nausea and vomiting that is not controlled by your nausea medication, call the clinic.   BELOW ARE SYMPTOMS THAT SHOULD BE REPORTED IMMEDIATELY:  *FEVER GREATER THAN 100.5 F  *CHILLS WITH OR WITHOUT FEVER  NAUSEA AND VOMITING THAT IS NOT CONTROLLED WITH YOUR NAUSEA MEDICATION  *UNUSUAL SHORTNESS OF BREATH  *UNUSUAL BRUISING OR BLEEDING  TENDERNESS IN MOUTH AND THROAT WITH OR WITHOUT PRESENCE OF ULCERS  *URINARY PROBLEMS  *BOWEL PROBLEMS  UNUSUAL RASH Items with * indicate a potential emergency and should be followed up as soon as possible.  Feel free to call the clinic you have any questions or concerns. The clinic phone number is [phone].  Please show the CHEMO ALERT CARD at check-in to the Emergency Department and triage nurse.   

## 2015-11-24 ENCOUNTER — Other Ambulatory Visit: Payer: Self-pay | Admitting: Hematology and Oncology

## 2015-11-26 ENCOUNTER — Telehealth: Payer: Self-pay

## 2015-11-26 NOTE — Telephone Encounter (Signed)
Patient called stating that he may be 10-15 minutes late for his lab appointment tomorrow due to the fact that his wife may be late picking up their two year old daughter.  Writer requested that patient try to be on time however this should not interfere with his other appts.

## 2015-11-27 ENCOUNTER — Ambulatory Visit (HOSPITAL_BASED_OUTPATIENT_CLINIC_OR_DEPARTMENT_OTHER): Payer: Medicaid Other | Admitting: Hematology and Oncology

## 2015-11-27 ENCOUNTER — Telehealth: Payer: Self-pay | Admitting: Hematology and Oncology

## 2015-11-27 ENCOUNTER — Encounter: Payer: Self-pay | Admitting: Hematology and Oncology

## 2015-11-27 ENCOUNTER — Ambulatory Visit (HOSPITAL_BASED_OUTPATIENT_CLINIC_OR_DEPARTMENT_OTHER): Payer: Medicaid Other

## 2015-11-27 ENCOUNTER — Telehealth: Payer: Self-pay | Admitting: *Deleted

## 2015-11-27 ENCOUNTER — Other Ambulatory Visit (HOSPITAL_BASED_OUTPATIENT_CLINIC_OR_DEPARTMENT_OTHER): Payer: Medicaid Other

## 2015-11-27 VITALS — BP 121/69 | HR 73 | Temp 98.1°F | Resp 18 | Ht 66.0 in | Wt 177.1 lb

## 2015-11-27 DIAGNOSIS — M542 Cervicalgia: Secondary | ICD-10-CM

## 2015-11-27 DIAGNOSIS — C7800 Secondary malignant neoplasm of unspecified lung: Secondary | ICD-10-CM

## 2015-11-27 DIAGNOSIS — K5909 Other constipation: Secondary | ICD-10-CM

## 2015-11-27 DIAGNOSIS — C119 Malignant neoplasm of nasopharynx, unspecified: Secondary | ICD-10-CM | POA: Diagnosis not present

## 2015-11-27 DIAGNOSIS — C760 Malignant neoplasm of head, face and neck: Secondary | ICD-10-CM

## 2015-11-27 DIAGNOSIS — Z5112 Encounter for antineoplastic immunotherapy: Secondary | ICD-10-CM | POA: Diagnosis not present

## 2015-11-27 LAB — CBC WITH DIFFERENTIAL/PLATELET
BASO%: 0.4 % (ref 0.0–2.0)
Basophils Absolute: 0 10*3/uL (ref 0.0–0.1)
EOS ABS: 0.1 10*3/uL (ref 0.0–0.5)
EOS%: 1.3 % (ref 0.0–7.0)
HCT: 40.6 % (ref 38.4–49.9)
HGB: 13.9 g/dL (ref 13.0–17.1)
LYMPH%: 21.3 % (ref 14.0–49.0)
MCH: 29.9 pg (ref 27.2–33.4)
MCHC: 34.2 g/dL (ref 32.0–36.0)
MCV: 87.3 fL (ref 79.3–98.0)
MONO#: 0.4 10*3/uL (ref 0.1–0.9)
MONO%: 7.9 % (ref 0.0–14.0)
NEUT%: 69.1 % (ref 39.0–75.0)
NEUTROS ABS: 3.8 10*3/uL (ref 1.5–6.5)
PLATELETS: 198 10*3/uL (ref 140–400)
RBC: 4.65 10*6/uL (ref 4.20–5.82)
RDW: 13.7 % (ref 11.0–14.6)
WBC: 5.4 10*3/uL (ref 4.0–10.3)
lymph#: 1.2 10*3/uL (ref 0.9–3.3)

## 2015-11-27 LAB — COMPREHENSIVE METABOLIC PANEL
ALT: 40 U/L (ref 0–55)
ANION GAP: 8 meq/L (ref 3–11)
AST: 29 U/L (ref 5–34)
Albumin: 4 g/dL (ref 3.5–5.0)
Alkaline Phosphatase: 68 U/L (ref 40–150)
BUN: 12.4 mg/dL (ref 7.0–26.0)
CHLORIDE: 107 meq/L (ref 98–109)
CO2: 25 meq/L (ref 22–29)
Calcium: 9.4 mg/dL (ref 8.4–10.4)
Creatinine: 1.1 mg/dL (ref 0.7–1.3)
GLUCOSE: 104 mg/dL (ref 70–140)
POTASSIUM: 4 meq/L (ref 3.5–5.1)
SODIUM: 141 meq/L (ref 136–145)
Total Bilirubin: 0.34 mg/dL (ref 0.20–1.20)
Total Protein: 6.9 g/dL (ref 6.4–8.3)

## 2015-11-27 MED ORDER — SODIUM CHLORIDE 0.9 % IV SOLN
240.0000 mg | Freq: Once | INTRAVENOUS | Status: AC
  Administered 2015-11-27: 240 mg via INTRAVENOUS
  Filled 2015-11-27: qty 20

## 2015-11-27 MED ORDER — SODIUM CHLORIDE 0.9 % IV SOLN
Freq: Once | INTRAVENOUS | Status: AC
  Administered 2015-11-27: 13:00:00 via INTRAVENOUS

## 2015-11-27 NOTE — Telephone Encounter (Signed)
per pof to sch pt appt-MW sch pt trmt-gave pt copy of avs

## 2015-11-27 NOTE — Progress Notes (Signed)
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 Pan-endoscopy elsewhere showed cancer from nasopharyngeal space.   01/04/2007 - 02/20/2007 Chemotherapy He received 2 cycles of cisplatin and 5FU followed by concurrent chemo with weekly cisplatin and radiation. He only received 2 doses of chemo due to severe mucositis, nausea and weight loss.   04/05/2007 - 08/04/2007 Chemotherapy He received 4 more courses of cisplatin with 5FU and had complete response   07/05/2009 Procedure Fine-needle aspirate of the right level II lymph nodes come from recurrent metastatic disease. Repeat endoscopy and CT scan show no evidence of disease elsewhere.   07/08/2009 - 12/02/2009 Chemotherapy He was given 6 cycles of carboplatin, 5-FU and docetaxel   12/03/2009 Surgery He has surgery to the residual lymph node on the right neck which showed no evidence of disease.   02/22/2012 Imaging Repeat imaging study showed large recurrent mass. He was referred elsewhere for further treatment.   05/03/2012 Surgery He underwent left upper lobectomy.   04/29/2013 Imaging PEt scan  showed lesion on right level II B and lower lung was abnormal   06/03/2013 - 02/02/2014 Chemotherapy He had 6 cycles of chemotherapy when he was found to have recurrence of cancer and had received oxaliplatin and capecitabine   06/07/2014 Imaging PET CT scan showed persistent disease in the right neck lymph nodes and left lung   06/29/2014 Procedure Accession: [id number] repeat LUL biopsy confirmed metastatic cancer   07/18/2014 - 07/31/2014 Radiation Therapy He received palliative radiation therapy to the lungs   10/10/2014 Imaging CT scan of the chest, abdomen and pelvis show regression in the size of the lung nodule in the left upper lobe and stable pulmonary nodules   01/24/2015 Imaging CT scan showed stable disease in neck and lung   06/19/2015 Imaging CT scan of the neck and the chest show possible mild progression of the nodule in the right side of the neck.   06/25/2015 Imaging PET scan confirmed disease recurrence in the neck   07/07/2015 Imaging He had [facility] neck at [facility]   09/03/2015 -  Chemotherapy He received palliative chemo with Nivolumab   10/29/2015 Imaging PET CT showed positive response to Rx    INTERVAL HISTORY: Please see below for problem oriented charting. He returns for further follow-up. He feels well. His pain has almost completely gone away and he only takes long-acting OxyContin once a day He continues to have chronic constipation but not bad, resolved with laxatives He tolerated treatment very well with no side effects  REVIEW OF SYSTEMS:   Constitutional: Denies fevers, chills or abnormal weight loss Eyes: Denies blurriness of vision Ears, nose,  mouth, throat, and face: Denies mucositis or sore throat Respiratory: Denies cough, dyspnea or wheezes Cardiovascular: Denies palpitation, chest discomfort or lower extremity swelling Gastrointestinal:  Denies nausea, heartburn or change in bowel habits Skin: Denies abnormal skin rashes Lymphatics: Denies new lymphadenopathy or  easy bruising Neurological:Denies numbness, tingling or new weaknesses Behavioral/Psych: Mood is stable, no new changes  All other systems were reviewed with the patient and are negative.  I have reviewed the past medical history, past surgical history, social history and family history with the patient and they are unchanged from previous note.  ALLERGIES:  has No Known Allergies.  MEDICATIONS:  Current Outpatient Prescriptions  Medication Sig Dispense Refill  . levothyroxine (SYNTHROID, LEVOTHROID) 100 MCG tablet TAKE 1 TABLET(100 MCG) BY MOUTH DAILY BEFORE BREAKFAST 90 tablet 2  . lidocaine-prilocaine (EMLA) cream Apply 1 application topically as needed. 30 g 3  . oxyCODONE (OXYCONTIN) 15 mg 12 hr tablet Take 1 tablet (15 mg total) by mouth every 12 (twelve) hours. 60 tablet 0  . oxyCODONE (ROXICODONE) 15 MG immediate release tablet Take 1 tablet (15 mg total) by mouth every 6 (six) hours as needed for severe pain. 90 tablet 0  . polyethylene glycol (MIRALAX) packet Take 17 g by mouth daily. 30 each 0  . promethazine (PHENERGAN) 25 MG tablet Take 1 tablet (25 mg total) by mouth every 6 (six) hours as needed for nausea or vomiting. 30 tablet 3   No current facility-administered medications for this visit.    PHYSICAL EXAMINATION: ECOG PERFORMANCE STATUS: 0 - Asymptomatic  Filed Vitals:   11/27/15 1115  BP: 121/69  Pulse: 73  Temp: 98.1 F (36.7 C)  Resp: 18   Filed Weights   11/27/15 1115  Weight: 177 lb 1.6 oz (80.332 kg)    GENERAL:alert, no distress and comfortable SKIN: skin color, texture, turgor are normal, no rashes or significant lesions EYES: normal, Conjunctiva are pink and non-injected, sclera clear OROPHARYNX:no exudate, no erythema and lips, buccal mucosa, and tongue normal  NECK: His surgical scar with no other abnormalities  LYMPH:  no palpable lymphadenopathy in the cervical, axillary or inguinal LUNGS: clear to auscultation and percussion with normal  breathing effort HEART: regular rate & rhythm and no murmurs and no lower extremity edema ABDOMEN:abdomen soft, non-tender and normal bowel sounds Musculoskeletal:no cyanosis of digits and no clubbing  NEURO: alert & oriented x 3 with fluent speech, no focal motor/sensory deficits  LABORATORY DATA:  I have reviewed the data as listed    Component Value Date/Time   NA 141 11/27/2015 1051   NA 141 06/29/2014 0655   K 4.0 11/27/2015 1051   K 4.4 06/29/2014 0655   CL 104 06/29/2014 0655   CO2 25 11/27/2015 1051   CO2 28 06/29/2014 0655   GLUCOSE 104 11/27/2015 1051   GLUCOSE 95 06/29/2014 0655   BUN 12.4 11/27/2015 1051   BUN 19 06/29/2014 0655   CREATININE 1.1 11/27/2015 1051   CREATININE 0.97 06/29/2014 0655   CALCIUM 9.4 11/27/2015 1051   CALCIUM 9.4 06/29/2014 0655   PROT 6.9 11/27/2015 1051   PROT 7.1 06/29/2014 0655   ALBUMIN 4.0 11/27/2015 1051   ALBUMIN 4.1 06/29/2014 0655   AST 29 11/27/2015 1051   AST 29 06/29/2014 0655   ALT 40 11/27/2015 1051   ALT 24 06/29/2014 0655   ALKPHOS 68 11/27/2015 1051   ALKPHOS 47 06/29/2014 0655   BILITOT 0.34 11/27/2015 1051   BILITOT 0.3 06/29/2014 0655   GFRNONAA >90  06/29/2014 0655   GFRAA >90 06/29/2014 0655    No results found for: SPEP, UPEP  Lab Results  Component Value Date   WBC 5.4 11/27/2015   NEUTROABS 3.8 11/27/2015   HGB 13.9 11/27/2015   HCT 40.6 11/27/2015   MCV 87.3 11/27/2015   PLT 198 11/27/2015      Chemistry      Component Value Date/Time   NA 141 11/27/2015 1051   NA 141 06/29/2014 0655   K 4.0 11/27/2015 1051   K 4.4 06/29/2014 0655   CL 104 06/29/2014 0655   CO2 25 11/27/2015 1051   CO2 28 06/29/2014 0655   BUN 12.4 11/27/2015 1051   BUN 19 06/29/2014 0655   CREATININE 1.1 11/27/2015 1051   CREATININE 0.97 06/29/2014 0655      Component Value Date/Time   CALCIUM 9.4 11/27/2015 1051   CALCIUM 9.4 06/29/2014 0655   ALKPHOS 68 11/27/2015 1051   ALKPHOS 47 06/29/2014 0655   AST 29  11/27/2015 1051   AST 29 06/29/2014 0655   ALT 40 11/27/2015 1051   ALT 24 06/29/2014 0655   BILITOT 0.34 11/27/2015 1051   BILITOT 0.3 06/29/2014 0655      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[place])  He had excellent response to treatment.  we will continue Nivolumab every 2 weeks for another 3 months before repeating imaging study, next due April 2017  Neck pain on right side The patient had good pain control with long-acting OxyContin. He takes it once a day with occasional breakthrough pain medicine. He had developed constipation, resolved with laxatives. Continue the same  Other constipation I suspect the constipation is multifactorial from pain medications and hypothyroidism I recommend he takes laxatives regularly.     No orders of the defined types were placed in this encounter.   All questions were answered. The patient knows to call the clinic with any problems, questions or concerns. No barriers to learning was detected. I spent 15 minutes counseling the patient face to face. The total time spent in the appointment was 20 minutes and more than 50% was on counseling and review of test results     [facility], [place], MD 11/27/2015 2:23 PM

## 2015-11-27 NOTE — Patient Instructions (Signed)
[facility] Discharge Instructions for Patients Receiving Chemotherapy  Today you received the following chemotherapy agents Nivolumab  To help prevent nausea and vomiting after your treatment, we encourage you to take your nausea medication     If you develop nausea and vomiting that is not controlled by your nausea medication, call the clinic.   BELOW ARE SYMPTOMS THAT SHOULD BE REPORTED IMMEDIATELY:  *FEVER GREATER THAN 100.5 F  *CHILLS WITH OR WITHOUT FEVER  NAUSEA AND VOMITING THAT IS NOT CONTROLLED WITH YOUR NAUSEA MEDICATION  *UNUSUAL SHORTNESS OF BREATH  *UNUSUAL BRUISING OR BLEEDING  TENDERNESS IN MOUTH AND THROAT WITH OR WITHOUT PRESENCE OF ULCERS  *URINARY PROBLEMS  *BOWEL PROBLEMS  UNUSUAL RASH Items with * indicate a potential emergency and should be followed up as soon as possible.  Feel free to call the clinic you have any questions or concerns. The clinic phone number is [phone].  Please show the CHEMO ALERT CARD at check-in to the Emergency Department and triage nurse.   

## 2015-11-27 NOTE — Assessment & Plan Note (Signed)
The patient had good pain control with long-acting OxyContin. He takes it once a day with occasional breakthrough pain medicine. He had developed constipation, resolved with laxatives. Continue the same

## 2015-11-27 NOTE — Assessment & Plan Note (Signed)
He had excellent response to treatment.  we will continue Nivolumab every 2 weeks for another 3 months before repeating imaging study, next due April 2017

## 2015-11-27 NOTE — Assessment & Plan Note (Signed)
I suspect the constipation is multifactorial from pain medications and hypothyroidism I recommend he takes laxatives regularly.

## 2015-11-27 NOTE — Telephone Encounter (Signed)
Per staff message and POF I have scheduled appts. Advised scheduler of appts. JMW  

## 2015-12-11 ENCOUNTER — Ambulatory Visit (HOSPITAL_BASED_OUTPATIENT_CLINIC_OR_DEPARTMENT_OTHER): Payer: Medicaid Other

## 2015-12-11 ENCOUNTER — Other Ambulatory Visit: Payer: Medicaid Other

## 2015-12-11 ENCOUNTER — Other Ambulatory Visit (HOSPITAL_BASED_OUTPATIENT_CLINIC_OR_DEPARTMENT_OTHER): Payer: Medicaid Other

## 2015-12-11 DIAGNOSIS — Z5112 Encounter for antineoplastic immunotherapy: Secondary | ICD-10-CM

## 2015-12-11 DIAGNOSIS — C119 Malignant neoplasm of nasopharynx, unspecified: Secondary | ICD-10-CM

## 2015-12-11 DIAGNOSIS — C7800 Secondary malignant neoplasm of unspecified lung: Secondary | ICD-10-CM

## 2015-12-11 DIAGNOSIS — C760 Malignant neoplasm of head, face and neck: Secondary | ICD-10-CM

## 2015-12-11 LAB — COMPREHENSIVE METABOLIC PANEL
ALT: 32 U/L (ref 0–55)
AST: 28 U/L (ref 5–34)
Albumin: 4.2 g/dL (ref 3.5–5.0)
Alkaline Phosphatase: 63 U/L (ref 40–150)
Anion Gap: 10 mEq/L (ref 3–11)
BUN: 10.6 mg/dL (ref 7.0–26.0)
CHLORIDE: 105 meq/L (ref 98–109)
CO2: 26 mEq/L (ref 22–29)
Calcium: 9.4 mg/dL (ref 8.4–10.4)
Creatinine: 0.9 mg/dL (ref 0.7–1.3)
EGFR: >90 mL/min/{1.73_m2} (ref >90)
GLUCOSE: 98 mg/dL (ref 70–140)
POTASSIUM: 3.9 meq/L (ref 3.5–5.1)
SODIUM: 140 meq/L (ref 136–145)
Total Bilirubin: 0.31 mg/dL (ref 0.20–1.20)
Total Protein: 7.1 g/dL (ref 6.4–8.3)

## 2015-12-11 LAB — CBC WITH DIFFERENTIAL/PLATELET
BASO%: 0.3 % (ref 0.0–2.0)
BASOS ABS: 0 10*3/uL (ref 0.0–0.1)
EOS%: 0.9 % (ref 0.0–7.0)
Eosinophils Absolute: 0.1 10*3/uL (ref 0.0–0.5)
HCT: 43.4 % (ref 38.4–49.9)
HGB: 14.6 g/dL (ref 13.0–17.1)
LYMPH%: 21 % (ref 14.0–49.0)
MCH: 29.6 pg (ref 27.2–33.4)
MCHC: 33.6 g/dL (ref 32.0–36.0)
MCV: 88 fL (ref 79.3–98.0)
MONO#: 0.6 10*3/uL (ref 0.1–0.9)
MONO%: 10.6 % (ref 0.0–14.0)
NEUT#: 3.9 10*3/uL (ref 1.5–6.5)
NEUT%: 67.2 % (ref 39.0–75.0)
Platelets: 185 10*3/uL (ref 140–400)
RBC: 4.93 10*6/uL (ref 4.20–5.82)
RDW: 13.9 % (ref 11.0–14.6)
WBC: 5.8 10*3/uL (ref 4.0–10.3)
lymph#: 1.2 10*3/uL (ref 0.9–3.3)

## 2015-12-11 MED ORDER — SODIUM CHLORIDE 0.9 % IV SOLN
Freq: Once | INTRAVENOUS | Status: AC
  Administered 2015-12-11: 12:00:00 via INTRAVENOUS

## 2015-12-11 MED ORDER — SODIUM CHLORIDE 0.9 % IV SOLN
240.0000 mg | Freq: Once | INTRAVENOUS | Status: AC
  Administered 2015-12-11: 240 mg via INTRAVENOUS
  Filled 2015-12-11: qty 8

## 2015-12-23 ENCOUNTER — Telehealth: Payer: Self-pay | Admitting: *Deleted

## 2015-12-23 ENCOUNTER — Telehealth: Payer: Self-pay | Admitting: Hematology and Oncology

## 2015-12-23 DIAGNOSIS — C760 Malignant neoplasm of head, face and neck: Secondary | ICD-10-CM

## 2015-12-23 NOTE — Telephone Encounter (Signed)
He is too young to have heart disease. I am not concerned if his symptoms are not persistent I would recommend him to take nausea medications as needed for nausea Please add EKG to be done when he checks in on Wednesday

## 2015-12-23 NOTE — Telephone Encounter (Signed)
Left v to inform patient of new appt times for 3/22 visit

## 2015-12-23 NOTE — Telephone Encounter (Signed)
"  Problem for several days now I feel like I'm about to vomit.  I feel a big pulse in my heart.  Shooting pain that only lasts a few seconds.  I feel short of breath, can't breath, cough and it's over.  Happens about two times a day but today it's already happened twice.  It's not palpitations.  It's one big shot "Boom" where my heart is.  I do not need to go to the ED.  I will tell her when I see her this week."  Next scheduled Lab, F/U, Nivolumab on 12-25-2015.  Mobile number [phone].    Will notify provider for any orders.  Patient called with C/O a problem.  Ended call due to an in-coming call and  This nurse returned the call advised ED but he denies palpitations or need to go to ED.

## 2015-12-23 NOTE — Telephone Encounter (Signed)
LM with message below. To call if has questions

## 2015-12-24 ENCOUNTER — Other Ambulatory Visit: Payer: Self-pay

## 2015-12-25 ENCOUNTER — Ambulatory Visit (HOSPITAL_BASED_OUTPATIENT_CLINIC_OR_DEPARTMENT_OTHER): Payer: Medicaid Other | Admitting: Hematology and Oncology

## 2015-12-25 ENCOUNTER — Other Ambulatory Visit: Payer: Self-pay

## 2015-12-25 ENCOUNTER — Encounter: Payer: Self-pay | Admitting: Hematology and Oncology

## 2015-12-25 ENCOUNTER — Ambulatory Visit (HOSPITAL_BASED_OUTPATIENT_CLINIC_OR_DEPARTMENT_OTHER): Payer: Medicaid Other

## 2015-12-25 ENCOUNTER — Encounter: Payer: Self-pay | Admitting: *Deleted

## 2015-12-25 ENCOUNTER — Other Ambulatory Visit (HOSPITAL_BASED_OUTPATIENT_CLINIC_OR_DEPARTMENT_OTHER): Payer: Medicaid Other

## 2015-12-25 ENCOUNTER — Telehealth: Payer: Self-pay | Admitting: Hematology and Oncology

## 2015-12-25 VITALS — BP 125/73 | HR 72 | Temp 98.2°F | Resp 18 | Ht 66.0 in | Wt 178.2 lb

## 2015-12-25 DIAGNOSIS — I499 Cardiac arrhythmia, unspecified: Secondary | ICD-10-CM | POA: Diagnosis not present

## 2015-12-25 DIAGNOSIS — C7800 Secondary malignant neoplasm of unspecified lung: Secondary | ICD-10-CM

## 2015-12-25 DIAGNOSIS — C119 Malignant neoplasm of nasopharynx, unspecified: Secondary | ICD-10-CM

## 2015-12-25 DIAGNOSIS — H44002 Unspecified purulent endophthalmitis, left eye: Secondary | ICD-10-CM

## 2015-12-25 DIAGNOSIS — C7802 Secondary malignant neoplasm of left lung: Secondary | ICD-10-CM | POA: Diagnosis not present

## 2015-12-25 DIAGNOSIS — Z5112 Encounter for antineoplastic immunotherapy: Secondary | ICD-10-CM

## 2015-12-25 DIAGNOSIS — C760 Malignant neoplasm of head, face and neck: Secondary | ICD-10-CM

## 2015-12-25 DIAGNOSIS — H019 Unspecified inflammation of eyelid: Secondary | ICD-10-CM

## 2015-12-25 DIAGNOSIS — I498 Other specified cardiac arrhythmias: Secondary | ICD-10-CM

## 2015-12-25 HISTORY — DX: Cardiac arrhythmia, unspecified: I49.9

## 2015-12-25 HISTORY — DX: Unspecified inflammation of eyelid: H01.9

## 2015-12-25 LAB — CBC WITH DIFFERENTIAL/PLATELET
BASO%: 0.2 % (ref 0.0–2.0)
Basophils Absolute: 0 10*3/uL (ref 0.0–0.1)
EOS%: 1.8 % (ref 0.0–7.0)
Eosinophils Absolute: 0.1 10*3/uL (ref 0.0–0.5)
HEMATOCRIT: 43.3 % (ref 38.4–49.9)
HGB: 14.6 g/dL (ref 13.0–17.1)
LYMPH#: 1.5 10*3/uL (ref 0.9–3.3)
LYMPH%: 30.4 % (ref 14.0–49.0)
MCH: 29.6 pg (ref 27.2–33.4)
MCHC: 33.7 g/dL (ref 32.0–36.0)
MCV: 87.7 fL (ref 79.3–98.0)
MONO#: 0.5 10*3/uL (ref 0.1–0.9)
MONO%: 10.6 % (ref 0.0–14.0)
NEUT%: 57 % (ref 39.0–75.0)
NEUTROS ABS: 2.8 10*3/uL (ref 1.5–6.5)
Platelets: 193 10*3/uL (ref 140–400)
RBC: 4.94 10*6/uL (ref 4.20–5.82)
RDW: 13.9 % (ref 11.0–14.6)
WBC: 4.9 10*3/uL (ref 4.0–10.3)

## 2015-12-25 LAB — COMPREHENSIVE METABOLIC PANEL
ALBUMIN: 4.3 g/dL (ref 3.5–5.0)
ALK PHOS: 61 U/L (ref 40–150)
ALT: 34 U/L (ref 0–55)
ANION GAP: 10 meq/L (ref 3–11)
AST: 25 U/L (ref 5–34)
BILIRUBIN TOTAL: 0.3 mg/dL (ref 0.20–1.20)
BUN: 15.1 mg/dL (ref 7.0–26.0)
CALCIUM: 9.5 mg/dL (ref 8.4–10.4)
CO2: 25 mEq/L (ref 22–29)
CREATININE: 0.9 mg/dL (ref 0.7–1.3)
Chloride: 106 mEq/L (ref 98–109)
EGFR: >90 mL/min/{1.73_m2} (ref >90)
Glucose: 100 mg/dl (ref 70–140)
Potassium: 3.7 mEq/L (ref 3.5–5.1)
Sodium: 142 mEq/L (ref 136–145)
TOTAL PROTEIN: 7.5 g/dL (ref 6.4–8.3)

## 2015-12-25 MED ORDER — SODIUM CHLORIDE 0.9 % IV SOLN
240.0000 mg | Freq: Once | INTRAVENOUS | Status: AC
  Administered 2015-12-25: 240 mg via INTRAVENOUS
  Filled 2015-12-25: qty 20

## 2015-12-25 MED ORDER — SODIUM CHLORIDE 0.9 % IV SOLN
Freq: Once | INTRAVENOUS | Status: AC
  Administered 2015-12-25: 12:00:00 via INTRAVENOUS

## 2015-12-25 MED ORDER — TOBRAMYCIN 0.3 % OP SOLN: 1.0000 [drp] | OPHTHALMIC | Status: DC

## 2015-12-25 NOTE — Patient Instructions (Signed)
Nivolumab injection What is this medicine? NIVOLUMAB (nye VOL ue mab) is a monoclonal antibody. It is used to treat melanoma, lung cancer, kidney cancer, and Hodgkin lymphoma. This medicine may be used for other purposes; ask your health care provider or pharmacist if you have questions. What should I tell my health care provider before I take this medicine? They need to know if you have any of these conditions: -diabetes -immune system problems -kidney disease -liver disease -lung disease -organ transplant -stomach or intestine problems -thyroid disease -an unusual or allergic reaction to nivolumab, other medicines, foods, dyes, or preservatives -pregnant or trying to get pregnant -breast-feeding How should I use this medicine? This medicine is for infusion into a vein. It is given by a health care professional in a hospital or clinic setting. A special MedGuide will be given to you before each treatment. Be sure to read this information carefully each time. Talk to your pediatrician regarding the use of this medicine in children. Special care may be needed. Overdosage: If you think you have taken too much of this medicine contact a poison control center or emergency room at once. NOTE: This medicine is only for you. Do not share this medicine with others. What if I miss a dose? It is important not to miss your dose. Call your doctor or health care professional if you are unable to keep an appointment. What may interact with this medicine? Interactions have not been studied. Give your health care provider a list of all the medicines, herbs, non-prescription drugs, or dietary supplements you use. Also tell them if you smoke, drink alcohol, or use illegal drugs. Some items may interact with your medicine. This list may not describe all possible interactions. Give your health care provider a list of all the medicines, herbs, non-prescription drugs, or dietary supplements you use. Also tell  them if you smoke, drink alcohol, or use illegal drugs. Some items may interact with your medicine. What should I watch for while using this medicine? This drug may make you feel generally unwell. Continue your course of treatment even though you feel ill unless your doctor tells you to stop. You may need blood work done while you are taking this medicine. Do not become pregnant while taking this medicine or for 5 months after stopping it. Women should inform their doctor if they wish to become pregnant or think they might be pregnant. There is a potential for serious side effects to an unborn child. Talk to your health care professional or pharmacist for more information. Do not breast-feed an infant while taking this medicine. What side effects may I notice from receiving this medicine? Side effects that you should report to your doctor or health care professional as soon as possible: -allergic reactions like skin rash, itching or hives, swelling of the face, lips, or tongue -black, tarry stools -blood in the urine -bloody or watery diarrhea -changes in vision -change in sex drive -changes in emotions or moods -chest pain -confusion -cough -decreased appetite -diarrhea -facial flushing -feeling faint or lightheaded -fever, chills -hair loss -hallucination, loss of contact with reality -headache -irritable -joint pain -loss of memory -muscle pain -muscle weakness -seizures -shortness of breath -signs and symptoms of high blood sugar such as dizziness; dry mouth; dry skin; fruity breath; nausea; stomach pain; increased hunger or thirst; increased urination -signs and symptoms of kidney injury like trouble passing urine or change in the amount of urine -signs and symptoms of liver injury like dark yellow or  brown urine; general ill feeling or flu-like symptoms; light-colored stools; loss of appetite; nausea; right upper belly pain; unusually weak or tired; yellowing of the eyes or  skin -stiff neck -swelling of the ankles, feet, hands -weight gain Side effects that usually do not require medical attention (report to your doctor or health care professional if they continue or are bothersome): -bone pain -constipation -tiredness -vomiting This list may not describe all possible side effects. Call your doctor for medical advice about side effects. You may report side effects to FDA at 1-800-FDA-1088. Where should I keep my medicine? This drug is given in a hospital or clinic and will not be stored at home. NOTE: This sheet is a summary. It may not cover all possible information. If you have questions about this medicine, talk to your doctor, pharmacist, or health care provider.    2016, [employer]/Gold Standard. (2015-02-20 10:03:42)

## 2015-12-25 NOTE — Assessment & Plan Note (Signed)
He is not symptomatic. Observe only 

## 2015-12-25 NOTE — Progress Notes (Signed)
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 Pan-endoscopy elsewhere showed cancer from nasopharyngeal space.   01/04/2007 - 02/20/2007 Chemotherapy He received 2 cycles of cisplatin and 5FU followed by concurrent chemo with weekly cisplatin and radiation. He only received 2 doses of chemo due to severe mucositis, nausea and weight loss.   04/05/2007 - 08/04/2007 Chemotherapy He received 4 more courses of cisplatin with 5FU and had complete response   07/05/2009 Procedure Fine-needle aspirate of the right level II lymph nodes come from recurrent metastatic disease. Repeat endoscopy and CT scan show no evidence of disease elsewhere.   07/08/2009 - 12/02/2009 Chemotherapy He was given 6 cycles of carboplatin, 5-FU and docetaxel   12/03/2009 Surgery He has surgery to the residual lymph node on the right neck which showed no evidence of disease.   02/22/2012 Imaging Repeat imaging study showed large recurrent mass. He was referred elsewhere for further treatment.   05/03/2012 Surgery He underwent left upper lobectomy.   04/29/2013 Imaging PEt scan  showed lesion on right level II B and lower lung was abnormal   06/03/2013 - 02/02/2014 Chemotherapy He had 6 cycles of chemotherapy when he was found to have recurrence of cancer and had received oxaliplatin and capecitabine   06/07/2014 Imaging PET CT scan showed persistent disease in the right neck lymph nodes and left lung   06/29/2014 Procedure Accession: [id number] repeat LUL biopsy confirmed metastatic cancer   07/18/2014 - 07/31/2014 Radiation Therapy He received palliative radiation therapy to the lungs   10/10/2014 Imaging CT scan of the chest, abdomen and pelvis show regression in the size of the lung nodule in the left upper lobe and stable pulmonary nodules   01/24/2015 Imaging CT scan showed stable disease in neck and lung   06/19/2015 Imaging CT scan of the neck and the chest show possible mild progression of the nodule in the right side of the neck.   06/25/2015 Imaging PET scan confirmed disease recurrence in the neck   07/07/2015 Imaging [facility]   09/03/2015 -  Chemotherapy He received palliative chemo with Nivolumab   10/29/2015 Imaging PET CT showed positive response to Rx    INTERVAL HISTORY: Please see below for problem oriented charting. He returns for further follow-up. He feels well. He complained of some minor eye infection on the left and sensation of intermittent palpitation and chest discomfort that has been present over the last few weeks, to 3 times a day without any aggravating or relieving factors. The chest discomfort only lasted seconds, resolved with coughing  REVIEW OF SYSTEMS:   Constitutional: Denies fevers, chills or  abnormal weight loss Eyes: Denies blurriness of vision Ears, nose, mouth, throat, and face: Denies mucositis or sore throat Respiratory: Denies cough, dyspnea or wheezes Gastrointestinal:  Denies nausea, heartburn or change in bowel habits Skin: Denies abnormal skin rashes Lymphatics: Denies new lymphadenopathy or easy  bruising Neurological:Denies numbness, tingling or new weaknesses Behavioral/Psych: Mood is stable, no new changes  All other systems were reviewed with the patient and are negative.  I have reviewed the past medical history, past surgical history, social history and family history with the patient and they are unchanged from previous note.  ALLERGIES:  has No Known Allergies.  MEDICATIONS:  Current Outpatient Prescriptions  Medication Sig Dispense Refill  . levothyroxine (SYNTHROID, LEVOTHROID) 100 MCG tablet TAKE 1 TABLET(100 MCG) BY MOUTH DAILY BEFORE BREAKFAST 90 tablet 2  . lidocaine-prilocaine (EMLA) cream Apply 1 application topically as needed. 30 g 3  . promethazine (PHENERGAN) 25 MG tablet Take 1 tablet (25 mg total) by mouth every 6 (six) hours as needed for nausea or vomiting. 30 tablet 3  . tobramycin (TOBREX) 0.3 % ophthalmic solution Place 1 drop into the left eye every 4 (four) hours. 5 mL 0   No current facility-administered medications for this visit.    PHYSICAL EXAMINATION: ECOG PERFORMANCE STATUS: 0 - Asymptomatic  Filed Vitals:   12/25/15 1058  BP: 125/73  Pulse: 72  Temp: 98.2 F (36.8 C)  Resp: 18   Filed Weights   12/25/15 1058  Weight: 178 lb 3.2 oz (80.831 kg)    GENERAL:alert, no distress and comfortable SKIN: skin color, texture, turgor are normal, no rashes or significant lesions EYES: normal, Conjunctiva appeared slightly pink and injected on the left eye OROPHARYNX:no exudate, no erythema and lips, buccal mucosa, and tongue normal  NECK: Fibrosis in the neck consistent with prior surgery and radiation LYMPH:  no palpable lymphadenopathy in the cervical, axillary or inguinal LUNGS: clear to auscultation and percussion with normal breathing effort HEART: regular rate & rhythm and no murmurs and no lower extremity edema ABDOMEN:abdomen soft, non-tender and normal bowel sounds Musculoskeletal:no cyanosis of digits and no clubbing  NEURO: alert  & oriented x 3 with fluent speech, no focal motor/sensory deficits  LABORATORY DATA:  I have reviewed the data as listed    Component Value Date/Time   NA 142 12/25/2015 1031   NA 141 06/29/2014 0655   K 3.7 12/25/2015 1031   K 4.4 06/29/2014 0655   CL 104 06/29/2014 0655   CO2 25 12/25/2015 1031   CO2 28 06/29/2014 0655   GLUCOSE 100 12/25/2015 1031   GLUCOSE 95 06/29/2014 0655   BUN 15.1 12/25/2015 1031   BUN 19 06/29/2014 0655   CREATININE 0.9 12/25/2015 1031   CREATININE 0.97 06/29/2014 0655   CALCIUM 9.5 12/25/2015 1031   CALCIUM 9.4 06/29/2014 0655   PROT 7.5 12/25/2015 1031   PROT 7.1 06/29/2014 0655   ALBUMIN 4.3 12/25/2015 1031   ALBUMIN 4.1 06/29/2014 0655   AST 25 12/25/2015 1031   AST 29 06/29/2014 0655   ALT 34 12/25/2015 1031   ALT 24 06/29/2014 0655   ALKPHOS 61 12/25/2015 1031   ALKPHOS 47 06/29/2014 0655   BILITOT 0.30 12/25/2015 1031   BILITOT 0.3 06/29/2014 0655   GFRNONAA >90 06/29/2014 0655   GFRAA >90 06/29/2014 0655    No results found for: SPEP, UPEP  Lab Results  Component Value Date   WBC 4.9 12/25/2015   NEUTROABS 2.8 12/25/2015   HGB 14.6 12/25/2015  HCT 43.3 12/25/2015   MCV 87.7 12/25/2015   PLT 193 12/25/2015      Chemistry      Component Value Date/Time   NA 142 12/25/2015 1031   NA 141 06/29/2014 0655   K 3.7 12/25/2015 1031   K 4.4 06/29/2014 0655   CL 104 06/29/2014 0655   CO2 25 12/25/2015 1031   CO2 28 06/29/2014 0655   BUN 15.1 12/25/2015 1031   BUN 19 06/29/2014 0655   CREATININE 0.9 12/25/2015 1031   CREATININE 0.97 06/29/2014 0655      Component Value Date/Time   CALCIUM 9.5 12/25/2015 1031   CALCIUM 9.4 06/29/2014 0655   ALKPHOS 61 12/25/2015 1031   ALKPHOS 47 06/29/2014 0655   AST 25 12/25/2015 1031   AST 29 06/29/2014 0655   ALT 34 12/25/2015 1031   ALT 24 06/29/2014 0655   BILITOT 0.30 12/25/2015 1031   BILITOT 0.3 06/29/2014 0655     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[place])   He had excellent response to treatment.  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 Observe only      Infection of eyelash follicle of left eye The infection is minor. I gave him prescription topical antibiotic eyedrop  Arrhythmia He has sensation of intermittent palpitations that resolved with coughing. EKG is normal. I recommend referral to cardiologist for evaluation and he agreed to proceed I want to rule out arrhythmia as a cause of his symptoms   Orders Placed This Encounter  Procedures  . Ambulatory referral to Cardiology    Referral Priority:  Routine    Referral Type:  Consultation    Referral Reason:  Specialty Services Required    Requested Specialty:  Cardiology    Number of Visits Requested:  1   All questions were answered. The patient knows to call the clinic with any problems, questions or concerns. No barriers to learning was detected. I spent 25 minutes counseling the patient face to face. The total time spent in the appointment was 30 minutes and more than 50% was on counseling and review of test results     [facility], Wilber Fini, MD 12/25/2015 11:12 AM

## 2015-12-25 NOTE — Telephone Encounter (Signed)
Gave adn prionted appt sched and avs for pt for April and May

## 2015-12-25 NOTE — Assessment & Plan Note (Signed)
He has sensation of intermittent palpitations that resolved with coughing. EKG is normal. I recommend referral to cardiologist for evaluation and he agreed to proceed I want to rule out arrhythmia as a cause of his symptoms

## 2015-12-25 NOTE — Assessment & Plan Note (Signed)
The infection is minor. I gave him prescription topical antibiotic eyedrop

## 2015-12-25 NOTE — Assessment & Plan Note (Signed)
He had excellent response to treatment.  we will continue Nivolumab every 2 weeks for another 3 months before repeating imaging study, next due end of April 2017

## 2016-01-08 ENCOUNTER — Ambulatory Visit (HOSPITAL_BASED_OUTPATIENT_CLINIC_OR_DEPARTMENT_OTHER): Payer: Medicaid Other

## 2016-01-08 ENCOUNTER — Other Ambulatory Visit (HOSPITAL_BASED_OUTPATIENT_CLINIC_OR_DEPARTMENT_OTHER): Payer: Medicaid Other

## 2016-01-08 ENCOUNTER — Telehealth: Payer: Self-pay | Admitting: *Deleted

## 2016-01-08 ENCOUNTER — Other Ambulatory Visit: Payer: Self-pay | Admitting: Hematology and Oncology

## 2016-01-08 VITALS — BP 122/56 | HR 90 | Temp 97.3°F | Resp 17

## 2016-01-08 DIAGNOSIS — C119 Malignant neoplasm of nasopharynx, unspecified: Secondary | ICD-10-CM

## 2016-01-08 DIAGNOSIS — C7802 Secondary malignant neoplasm of left lung: Secondary | ICD-10-CM | POA: Diagnosis not present

## 2016-01-08 DIAGNOSIS — Z5112 Encounter for antineoplastic immunotherapy: Secondary | ICD-10-CM

## 2016-01-08 DIAGNOSIS — C760 Malignant neoplasm of head, face and neck: Secondary | ICD-10-CM

## 2016-01-08 DIAGNOSIS — E039 Hypothyroidism, unspecified: Secondary | ICD-10-CM

## 2016-01-08 DIAGNOSIS — C7800 Secondary malignant neoplasm of unspecified lung: Secondary | ICD-10-CM

## 2016-01-08 LAB — CBC WITH DIFFERENTIAL/PLATELET
BASO%: 0.6 % (ref 0.0–2.0)
Basophils Absolute: 0 10*3/uL (ref 0.0–0.1)
EOS%: 2.5 % (ref 0.0–7.0)
Eosinophils Absolute: 0.1 10*3/uL (ref 0.0–0.5)
HEMATOCRIT: 44.2 % (ref 38.4–49.9)
HEMOGLOBIN: 14.9 g/dL (ref 13.0–17.1)
LYMPH#: 1.2 10*3/uL (ref 0.9–3.3)
LYMPH%: 23.2 % (ref 14.0–49.0)
MCH: 29.4 pg (ref 27.2–33.4)
MCHC: 33.7 g/dL (ref 32.0–36.0)
MCV: 87.4 fL (ref 79.3–98.0)
MONO#: 0.7 10*3/uL (ref 0.1–0.9)
MONO%: 12.7 % (ref 0.0–14.0)
NEUT%: 61 % (ref 39.0–75.0)
NEUTROS ABS: 3.2 10*3/uL (ref 1.5–6.5)
NRBC: 0 % (ref 0–0)
Platelets: 174 10*3/uL (ref 140–400)
RBC: 5.06 10*6/uL (ref 4.20–5.82)
RDW: 13.9 % (ref 11.0–14.6)
WBC: 5.3 10*3/uL (ref 4.0–10.3)

## 2016-01-08 LAB — COMPREHENSIVE METABOLIC PANEL
ALBUMIN: 4 g/dL (ref 3.5–5.0)
ALK PHOS: 58 U/L (ref 40–150)
ALT: 25 U/L (ref 0–55)
ANION GAP: 9 meq/L (ref 3–11)
AST: 26 U/L (ref 5–34)
BUN: 11.2 mg/dL (ref 7.0–26.0)
CALCIUM: 9.3 mg/dL (ref 8.4–10.4)
CHLORIDE: 107 meq/L (ref 98–109)
CO2: 24 mEq/L (ref 22–29)
Creatinine: 0.9 mg/dL (ref 0.7–1.3)
Glucose: 109 mg/dl (ref 70–140)
POTASSIUM: 3.8 meq/L (ref 3.5–5.1)
Sodium: 140 mEq/L (ref 136–145)
Total Bilirubin: 0.32 mg/dL (ref 0.20–1.20)
Total Protein: 7 g/dL (ref 6.4–8.3)

## 2016-01-08 MED ORDER — SODIUM CHLORIDE 0.9 % IV SOLN
Freq: Once | INTRAVENOUS | Status: AC
  Administered 2016-01-08: 12:00:00 via INTRAVENOUS

## 2016-01-08 MED ORDER — SODIUM CHLORIDE 0.9 % IV SOLN
240.0000 mg | Freq: Once | INTRAVENOUS | Status: AC
  Administered 2016-01-08: 240 mg via INTRAVENOUS
  Filled 2016-01-08: qty 20

## 2016-01-08 NOTE — Telephone Encounter (Signed)
Lab reports TSH blood specimen was hemolyzed so Infusion RN re collected and the re-collected specimen is also unfortunately hemolyzed. Pt has already completed treatment and left clinic today.  Please advise.

## 2016-01-08 NOTE — Telephone Encounter (Signed)
S/w pt in lobby.   He wants Dr. Alvy Bimler to know he stopped taking Levothyroxine about 2 weeks ago because he was having heart palpitations.  He says he does not have any more palpitations since stopping the levothyroxine.   He admits Dr. Alvy Bimler did not instruct him to stop this medication but he thinks she told him that it might be causing the palpitations so he decided to stop taking it on his own.  He also says he has not heard about Cardiology consult but wonders if he still needs it since his palpitations have stopped?

## 2016-01-08 NOTE — Telephone Encounter (Signed)
Instructed pt per Dr. Alvy Bimler to resume levothyroxine at 1/2 pill once a day.  We will recheck his TSH on next appt in 2 weeks.   Reminded pt to not stop or change medications on  w/o first checking w/ MD.  He verbalized understanding.

## 2016-01-08 NOTE — Patient Instructions (Signed)
[facility] Discharge Instructions for Patients Receiving Chemotherapy  Today you received the following chemotherapy agents:  Nivolumab (Opdivo)  To help prevent nausea and vomiting after your treatment, we encourage you to take your nausea medication as prescribed.    If you develop nausea and vomiting that is not controlled by your nausea medication, call the clinic.   BELOW ARE SYMPTOMS THAT SHOULD BE REPORTED IMMEDIATELY:  *FEVER GREATER THAN 100.5 F  *CHILLS WITH OR WITHOUT FEVER  NAUSEA AND VOMITING THAT IS NOT CONTROLLED WITH YOUR NAUSEA MEDICATION  *UNUSUAL SHORTNESS OF BREATH  *UNUSUAL BRUISING OR BLEEDING  TENDERNESS IN MOUTH AND THROAT WITH OR WITHOUT PRESENCE OF ULCERS  *URINARY PROBLEMS  *BOWEL PROBLEMS  UNUSUAL RASH Items with * indicate a potential emergency and should be followed up as soon as possible.  Feel free to call the clinic you have any questions or concerns. The clinic phone number is [phone].  Please show the [place] at check-in to the Emergency Department and triage nurse.

## 2016-01-10 ENCOUNTER — Other Ambulatory Visit: Payer: Self-pay | Admitting: Hematology and Oncology

## 2016-01-22 ENCOUNTER — Ambulatory Visit (HOSPITAL_BASED_OUTPATIENT_CLINIC_OR_DEPARTMENT_OTHER): Payer: Medicaid Other

## 2016-01-22 ENCOUNTER — Telehealth: Payer: Self-pay | Admitting: Hematology and Oncology

## 2016-01-22 ENCOUNTER — Encounter: Payer: Self-pay | Admitting: Hematology and Oncology

## 2016-01-22 ENCOUNTER — Telehealth: Payer: Self-pay | Admitting: *Deleted

## 2016-01-22 ENCOUNTER — Other Ambulatory Visit (HOSPITAL_BASED_OUTPATIENT_CLINIC_OR_DEPARTMENT_OTHER): Payer: Medicaid Other

## 2016-01-22 ENCOUNTER — Ambulatory Visit (HOSPITAL_BASED_OUTPATIENT_CLINIC_OR_DEPARTMENT_OTHER): Payer: Medicaid Other | Admitting: Hematology and Oncology

## 2016-01-22 VITALS — BP 124/69 | HR 72 | Temp 97.9°F | Resp 18 | Ht 66.0 in | Wt 175.9 lb

## 2016-01-22 DIAGNOSIS — C7800 Secondary malignant neoplasm of unspecified lung: Secondary | ICD-10-CM | POA: Diagnosis not present

## 2016-01-22 DIAGNOSIS — Z5112 Encounter for antineoplastic immunotherapy: Secondary | ICD-10-CM | POA: Diagnosis not present

## 2016-01-22 DIAGNOSIS — E039 Hypothyroidism, unspecified: Secondary | ICD-10-CM

## 2016-01-22 DIAGNOSIS — K2970 Gastritis, unspecified, without bleeding: Secondary | ICD-10-CM | POA: Diagnosis not present

## 2016-01-22 DIAGNOSIS — C760 Malignant neoplasm of head, face and neck: Secondary | ICD-10-CM

## 2016-01-22 DIAGNOSIS — C119 Malignant neoplasm of nasopharynx, unspecified: Secondary | ICD-10-CM

## 2016-01-22 LAB — COMPREHENSIVE METABOLIC PANEL
ALT: 23 U/L (ref 0–55)
AST: 23 U/L (ref 5–34)
Albumin: 4.2 g/dL (ref 3.5–5.0)
Alkaline Phosphatase: 57 U/L (ref 40–150)
Anion Gap: 12 mEq/L — ABNORMAL HIGH (ref 3–11)
BUN: 12.9 mg/dL (ref 7.0–26.0)
CHLORIDE: 105 meq/L (ref 98–109)
CO2: 25 meq/L (ref 22–29)
Calcium: 9.9 mg/dL (ref 8.4–10.4)
Creatinine: 0.9 mg/dL (ref 0.7–1.3)
GLUCOSE: 86 mg/dL (ref 70–140)
POTASSIUM: 3.8 meq/L (ref 3.5–5.1)
SODIUM: 142 meq/L (ref 136–145)
TOTAL PROTEIN: 7.2 g/dL (ref 6.4–8.3)
Total Bilirubin: 0.32 mg/dL (ref 0.20–1.20)

## 2016-01-22 LAB — CBC WITH DIFFERENTIAL/PLATELET
BASO%: 0.5 % (ref 0.0–2.0)
BASOS ABS: 0 10*3/uL (ref 0.0–0.1)
EOS%: 2.2 % (ref 0.0–7.0)
Eosinophils Absolute: 0.1 10*3/uL (ref 0.0–0.5)
HCT: 45.7 % (ref 38.4–49.9)
HGB: 15.8 g/dL (ref 13.0–17.1)
LYMPH%: 32 % (ref 14.0–49.0)
MCH: 29.9 pg (ref 27.2–33.4)
MCHC: 34.6 g/dL (ref 32.0–36.0)
MCV: 86.4 fL (ref 79.3–98.0)
MONO#: 0.4 10*3/uL (ref 0.1–0.9)
MONO%: 9.1 % (ref 0.0–14.0)
NEUT#: 2.3 10*3/uL (ref 1.5–6.5)
NEUT%: 56.2 % (ref 39.0–75.0)
NRBC: 0 % (ref 0–0)
Platelets: 185 10*3/uL (ref 140–400)
RBC: 5.29 10*6/uL (ref 4.20–5.82)
RDW: 14 % (ref 11.0–14.6)
WBC: 4.2 10*3/uL (ref 4.0–10.3)
lymph#: 1.3 10*3/uL (ref 0.9–3.3)

## 2016-01-22 LAB — TSH: TSH: 4.09 m(IU)/L (ref 0.320–4.118)

## 2016-01-22 MED ORDER — SODIUM CHLORIDE 0.9 % IV SOLN
Freq: Once | INTRAVENOUS | Status: AC
  Administered 2016-01-22: 250 mL via INTRAVENOUS

## 2016-01-22 MED ORDER — SODIUM CHLORIDE 0.9 % IV SOLN
240.0000 mg | Freq: Once | INTRAVENOUS | Status: AC
  Administered 2016-01-22: 240 mg via INTRAVENOUS
  Filled 2016-01-22: qty 20

## 2016-01-22 NOTE — Telephone Encounter (Signed)
Per staff message and POF I have scheduled appts. Advised scheduler of appts and to move labns . JMW

## 2016-01-22 NOTE — Assessment & Plan Note (Signed)
He had excellent response to treatment.  we will continue Nivolumab every 2 weeks for another 3 months before repeating imaging study, next due end of May 2017

## 2016-01-22 NOTE — Patient Instructions (Signed)
[facility] Discharge Instructions for Patients Receiving Chemotherapy  Today you received the following chemotherapy agents:  Opdivo  To help prevent nausea and vomiting after your treatment, we encourage you to take your nausea medication as prescribed.   If you develop nausea and vomiting that is not controlled by your nausea medication, call the clinic.   BELOW ARE SYMPTOMS THAT SHOULD BE REPORTED IMMEDIATELY:  *FEVER GREATER THAN 100.5 F  *CHILLS WITH OR WITHOUT FEVER  NAUSEA AND VOMITING THAT IS NOT CONTROLLED WITH YOUR NAUSEA MEDICATION  *UNUSUAL SHORTNESS OF BREATH  *UNUSUAL BRUISING OR BLEEDING  TENDERNESS IN MOUTH AND THROAT WITH OR WITHOUT PRESENCE OF ULCERS  *URINARY PROBLEMS  *BOWEL PROBLEMS  UNUSUAL RASH Items with * indicate a potential emergency and should be followed up as soon as possible.  Feel free to call the clinic you have any questions or concerns. The clinic phone number is [phone].  Please show the CHEMO ALERT CARD at check-in to the Emergency Department and triage nurse.   

## 2016-01-22 NOTE — Progress Notes (Signed)
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 Pan-endoscopy elsewhere showed cancer from nasopharyngeal space.   01/04/2007 - 02/20/2007 Chemotherapy He received 2 cycles of cisplatin and 5FU followed by concurrent chemo with weekly cisplatin and radiation. He only received 2 doses of chemo due to severe mucositis, nausea and weight loss.   04/05/2007 - 08/04/2007 Chemotherapy He received 4 more courses of cisplatin with 5FU and had complete response   07/05/2009 Procedure Fine-needle aspirate of the right level II lymph nodes come from recurrent metastatic disease. Repeat endoscopy and CT scan show no evidence of disease elsewhere.   07/08/2009 - 12/02/2009 Chemotherapy He was given 6 cycles of carboplatin, 5-FU and docetaxel   12/03/2009 Surgery He has surgery to the residual lymph node on the right neck which showed no evidence of disease.   02/22/2012 Imaging Repeat imaging study showed large recurrent mass. He was referred elsewhere for further treatment.   05/03/2012 Surgery He underwent left upper lobectomy.   04/29/2013 Imaging PEt scan  showed lesion on right level II B and lower lung was abnormal   06/03/2013 - 02/02/2014 Chemotherapy He had 6 cycles of chemotherapy when he was found to have recurrence of cancer and had received oxaliplatin and capecitabine   06/07/2014 Imaging PET CT scan showed persistent disease in the right neck lymph nodes and left lung   06/29/2014 Procedure Accession: [id number] repeat LUL biopsy confirmed metastatic cancer   07/18/2014 - 07/31/2014 Radiation Therapy He received palliative radiation therapy to the lungs   10/10/2014 Imaging CT scan of the chest, abdomen and pelvis show regression in the size of the lung nodule in the left upper lobe and stable pulmonary nodules   01/24/2015 Imaging CT scan showed stable disease in neck and lung   06/19/2015 Imaging CT scan of the neck and the chest show possible mild progression of the nodule in the right side of the neck.   06/25/2015 Imaging PET scan confirmed disease recurrence in the neck   07/07/2015 Imaging He had MRI neck at [facility]   09/03/2015 -  Chemotherapy He received palliative chemo with Nivolumab   10/29/2015 Imaging PET CT showed positive response to Rx    INTERVAL HISTORY: Please see below for problem oriented charting. He returns for further follow-up. He feels well. No pain. No side effects of treatment. He complained of mild gastritis recently. REVIEW OF SYSTEMS:   Constitutional: Denies fevers, chills or abnormal weight loss Eyes: Denies blurriness of vision Ears, nose, mouth, throat, and face: Denies mucositis or sore throat Respiratory: Denies cough, dyspnea or wheezes Cardiovascular: Denies palpitation, chest discomfort or lower extremity swelling Skin:  Denies abnormal skin rashes Lymphatics: Denies new lymphadenopathy or easy bruising Neurological:Denies numbness, tingling or new weaknesses Behavioral/Psych: Mood is stable, no new changes  All other systems were reviewed with the patient and are negative.  I have reviewed the past  medical history, past surgical history, social history and family history with the patient and they are unchanged from previous note.  ALLERGIES:  has No Known Allergies.  MEDICATIONS:  Current Outpatient Prescriptions  Medication Sig Dispense Refill  . levothyroxine (SYNTHROID, LEVOTHROID) 100 MCG tablet TAKE 1 TABLET(100 MCG) BY MOUTH DAILY BEFORE BREAKFAST 90 tablet 2  . lidocaine-prilocaine (EMLA) cream Apply 1 application topically as needed. 30 g 3  . promethazine (PHENERGAN) 25 MG tablet Take 1 tablet (25 mg total) by mouth every 6 (six) hours as needed for nausea or vomiting. 30 tablet 3   No current facility-administered medications for this visit.    PHYSICAL EXAMINATION: ECOG PERFORMANCE STATUS: 0 - Asymptomatic  Filed Vitals:   01/22/16 1118  BP: 124/69  Pulse: 72  Temp: 97.9 F (36.6 C)  Resp: 18   Filed Weights   01/22/16 1118  Weight: 175 lb 14.4 oz (79.788 kg)    GENERAL:alert, no distress and comfortable SKIN: skin color, texture, turgor are normal, no rashes or significant lesions EYES: normal, Conjunctiva are pink and non-injected, sclera clear OROPHARYNX:no exudate, no erythema and lips, buccal mucosa, and tongue normal  NECK:Neck is [place] from prior radiation.  LYMPH:  no palpable lymphadenopathy in the cervical, axillary or inguinal LUNGS: clear to auscultation and percussion with normal breathing effort HEART: regular rate & rhythm and no murmurs and no lower extremity edema ABDOMEN:abdomen soft, non-tender and normal bowel sounds Musculoskeletal:no cyanosis of digits and no clubbing  NEURO: alert & oriented x 3 with fluent speech, no focal motor/sensory deficits  LABORATORY DATA:  I have reviewed the data as listed    Component Value Date/Time   NA 142 01/22/2016 1100   NA 141 06/29/2014 0655   K 3.8 01/22/2016 1100   K 4.4 06/29/2014 0655   CL 104 06/29/2014 0655   CO2 25 01/22/2016 1100   CO2 28 06/29/2014 0655   GLUCOSE 86 01/22/2016  1100   GLUCOSE 95 06/29/2014 0655   BUN 12.9 01/22/2016 1100   BUN 19 06/29/2014 0655   CREATININE 0.9 01/22/2016 1100   CREATININE 0.97 06/29/2014 0655   CALCIUM 9.9 01/22/2016 1100   CALCIUM 9.4 06/29/2014 0655   PROT 7.2 01/22/2016 1100   PROT 7.1 06/29/2014 0655   ALBUMIN 4.2 01/22/2016 1100   ALBUMIN 4.1 06/29/2014 0655   AST 23 01/22/2016 1100   AST 29 06/29/2014 0655   ALT 23 01/22/2016 1100   ALT 24 06/29/2014 0655   ALKPHOS 57 01/22/2016 1100   ALKPHOS 47 06/29/2014 0655   BILITOT 0.32 01/22/2016 1100   BILITOT 0.3 06/29/2014 0655   GFRNONAA >90 06/29/2014 0655   GFRAA >90 06/29/2014 0655    No results found for: SPEP, UPEP  Lab Results  Component Value Date   WBC 4.2 01/22/2016   NEUTROABS 2.3 01/22/2016   HGB 15.8 01/22/2016   HCT 45.7 01/22/2016   MCV 86.4 01/22/2016   PLT 185 01/22/2016      Chemistry      Component Value Date/Time   NA 142 01/22/2016 1100   NA 141 06/29/2014 0655   K 3.8 01/22/2016 1100   K 4.4 06/29/2014 0655   CL 104 06/29/2014 0655   CO2 25 01/22/2016 1100  CO2 28 06/29/2014 0655   BUN 12.9 01/22/2016 1100   BUN 19 06/29/2014 0655   CREATININE 0.9 01/22/2016 1100   CREATININE 0.97 06/29/2014 0655      Component Value Date/Time   CALCIUM 9.9 01/22/2016 1100   CALCIUM 9.4 06/29/2014 0655   ALKPHOS 57 01/22/2016 1100   ALKPHOS 47 06/29/2014 0655   AST 23 01/22/2016 1100   AST 29 06/29/2014 0655   ALT 23 01/22/2016 1100   ALT 24 06/29/2014 0655   BILITOT 0.32 01/22/2016 1100   BILITOT 0.3 06/29/2014 0655      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[place])  He had excellent response to treatment.  we will continue Nivolumab every 2 weeks for another 3 months before repeating imaging study, next due end of May 2017   Acquired hypothyroidism He has acquired hypothyroidism from prior radiation and surgery to his neck. He is compliant taking his thyroid medicine. I reinforced the importance of taking his  thyroid replacement therapy regularly. TSH today showed adequate replacement  Gastritis without bleeding He has mild gastritis, likely exacerbated by stress. I recommend he takes calcium carbonate over-the-counter as needed   Orders Placed This Encounter  Procedures  . CT Soft Tissue Neck W Contrast    Standing Status: Future     Number of Occurrences:      Standing Expiration Date: 04/23/2017    Order Specific Question:  Reason for Exam (SYMPTOM  OR DIAGNOSIS REQUIRED)    Answer:  cancer of head and neck, assess response to Rx    Order Specific Question:  Preferred imaging location?    Answer:  [facility]   All questions were answered. The patient knows to call the clinic with any problems, questions or concerns. No barriers to learning was detected. I spent 15 minutes counseling the patient face to face. The total time spent in the appointment was 20 minutes and more than 50% was on counseling and review of test results     [facility], Rogers, MD 01/22/2016 12:47 PM

## 2016-01-22 NOTE — Assessment & Plan Note (Signed)
He has mild gastritis, likely exacerbated by stress. I recommend he takes calcium carbonate over-the-counter as needed

## 2016-01-22 NOTE — Assessment & Plan Note (Addendum)
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 He is compliant taking his thyroid medicine. I reinforced the importance of taking his thyroid replacement therapy regularly. TSH today showed adequate replacement

## 2016-01-22 NOTE — Telephone Encounter (Signed)
per po to sch pt appt-sent MW emailto sch trmt-gave pt copy of avs

## 2016-02-05 ENCOUNTER — Other Ambulatory Visit (HOSPITAL_BASED_OUTPATIENT_CLINIC_OR_DEPARTMENT_OTHER): Payer: Medicaid Other

## 2016-02-05 ENCOUNTER — Ambulatory Visit (HOSPITAL_BASED_OUTPATIENT_CLINIC_OR_DEPARTMENT_OTHER): Payer: Medicaid Other

## 2016-02-05 ENCOUNTER — Other Ambulatory Visit: Payer: Medicaid Other

## 2016-02-05 VITALS — BP 135/67 | HR 80 | Temp 97.8°F

## 2016-02-05 DIAGNOSIS — C760 Malignant neoplasm of head, face and neck: Secondary | ICD-10-CM

## 2016-02-05 DIAGNOSIS — C7800 Secondary malignant neoplasm of unspecified lung: Secondary | ICD-10-CM

## 2016-02-05 DIAGNOSIS — C119 Malignant neoplasm of nasopharynx, unspecified: Secondary | ICD-10-CM

## 2016-02-05 DIAGNOSIS — Z5112 Encounter for antineoplastic immunotherapy: Secondary | ICD-10-CM | POA: Diagnosis present

## 2016-02-05 LAB — COMPREHENSIVE METABOLIC PANEL
ALBUMIN: 4.2 g/dL (ref 3.5–5.0)
ALK PHOS: 62 U/L (ref 40–150)
ALT: 29 U/L (ref 0–55)
ANION GAP: 11 meq/L (ref 3–11)
AST: 23 U/L (ref 5–34)
BUN: 10.9 mg/dL (ref 7.0–26.0)
CO2: 25 mEq/L (ref 22–29)
Calcium: 9.6 mg/dL (ref 8.4–10.4)
Chloride: 106 mEq/L (ref 98–109)
Creatinine: 1 mg/dL (ref 0.7–1.3)
GLUCOSE: 87 mg/dL (ref 70–140)
POTASSIUM: 3.6 meq/L (ref 3.5–5.1)
SODIUM: 141 meq/L (ref 136–145)
Total Bilirubin: 0.48 mg/dL (ref 0.20–1.20)
Total Protein: 7 g/dL (ref 6.4–8.3)

## 2016-02-05 LAB — CBC WITH DIFFERENTIAL/PLATELET
BASO%: 0.6 % (ref 0.0–2.0)
Basophils Absolute: 0 10*3/uL (ref 0.0–0.1)
EOS%: 1.5 % (ref 0.0–7.0)
Eosinophils Absolute: 0.1 10*3/uL (ref 0.0–0.5)
HCT: 48 % (ref 38.4–49.9)
HGB: 15.7 g/dL (ref 13.0–17.1)
LYMPH%: 25.9 % (ref 14.0–49.0)
MCH: 28.7 pg (ref 27.2–33.4)
MCHC: 32.7 g/dL (ref 32.0–36.0)
MCV: 87.9 fL (ref 79.3–98.0)
MONO#: 0.5 10*3/uL (ref 0.1–0.9)
MONO%: 11 % (ref 0.0–14.0)
NEUT%: 61 % (ref 39.0–75.0)
NEUTROS ABS: 3 10*3/uL (ref 1.5–6.5)
PLATELETS: 207 10*3/uL (ref 140–400)
RBC: 5.46 10*6/uL (ref 4.20–5.82)
RDW: 15.1 % — ABNORMAL HIGH (ref 11.0–14.6)
WBC: 5 10*3/uL (ref 4.0–10.3)
lymph#: 1.3 10*3/uL (ref 0.9–3.3)

## 2016-02-05 MED ORDER — SODIUM CHLORIDE 0.9 % IV SOLN
Freq: Once | INTRAVENOUS | Status: AC
  Administered 2016-02-05: 12:00:00 via INTRAVENOUS

## 2016-02-05 MED ORDER — SODIUM CHLORIDE 0.9 % IV SOLN
240.0000 mg | Freq: Once | INTRAVENOUS | Status: AC
  Administered 2016-02-05: 240 mg via INTRAVENOUS
  Filled 2016-02-05: qty 8

## 2016-02-05 NOTE — Patient Instructions (Signed)
[facility] Discharge Instructions for Patients Receiving Chemotherapy  Today you received the following chemotherapy agents:  Nivolumab.  To help prevent nausea and vomiting after your treatment, we encourage you to take your nausea medication as directed.   If you develop nausea and vomiting that is not controlled by your nausea medication, call the clinic.   BELOW ARE SYMPTOMS THAT SHOULD BE REPORTED IMMEDIATELY:  *FEVER GREATER THAN 100.5 F  *CHILLS WITH OR WITHOUT FEVER  NAUSEA AND VOMITING THAT IS NOT CONTROLLED WITH YOUR NAUSEA MEDICATION  *UNUSUAL SHORTNESS OF BREATH  *UNUSUAL BRUISING OR BLEEDING  TENDERNESS IN MOUTH AND THROAT WITH OR WITHOUT PRESENCE OF ULCERS  *URINARY PROBLEMS  *BOWEL PROBLEMS  UNUSUAL RASH Items with * indicate a potential emergency and should be followed up as soon as possible.  Feel free to call the clinic you have any questions or concerns. The clinic phone number is [phone].  Please show the CHEMO ALERT CARD at check-in to the Emergency Department and triage nurse.   

## 2016-02-07 ENCOUNTER — Other Ambulatory Visit: Payer: Self-pay

## 2016-02-07 ENCOUNTER — Ambulatory Visit (HOSPITAL_BASED_OUTPATIENT_CLINIC_OR_DEPARTMENT_OTHER): Payer: Medicaid Other | Admitting: Nurse Practitioner

## 2016-02-07 ENCOUNTER — Telehealth: Payer: Self-pay | Admitting: Nurse Practitioner

## 2016-02-07 ENCOUNTER — Other Ambulatory Visit: Payer: Self-pay | Admitting: Nurse Practitioner

## 2016-02-07 VITALS — BP 114/66 | HR 88 | Temp 98.3°F | Resp 18 | Ht 66.0 in | Wt 171.5 lb

## 2016-02-07 DIAGNOSIS — R1013 Epigastric pain: Secondary | ICD-10-CM | POA: Diagnosis present

## 2016-02-07 DIAGNOSIS — C760 Malignant neoplasm of head, face and neck: Secondary | ICD-10-CM | POA: Diagnosis not present

## 2016-02-07 MED ORDER — ONDANSETRON HCL 8 MG PO TABS: 8.0000 mg | ORAL_TABLET | Freq: Three times a day (TID) | ORAL | Status: DC | PRN

## 2016-02-07 MED ORDER — OMEPRAZOLE 20 MG PO CPDR: 20.0000 mg | DELAYED_RELEASE_CAPSULE | Freq: Two times a day (BID) | ORAL | Status: DC

## 2016-02-07 NOTE — Telephone Encounter (Signed)
per pof to sch sym mgmt '@2'$ :30 pt aware

## 2016-02-07 NOTE — Progress Notes (Unsigned)
Patient called today c/o an "constant empty feeling in his stomach", when patient eats he often becomes nauseated and vomits.  Dr. Alvy Bimler is not in the [place] today, per Tammi, RN patient should be evaluated by [facility]. Writer spoke with Cyndee, NP and had it approved.  Patient will be in today at 230.

## 2016-02-10 ENCOUNTER — Encounter: Payer: Self-pay | Admitting: Nurse Practitioner

## 2016-02-10 DIAGNOSIS — R1013 Epigastric pain: Secondary | ICD-10-CM | POA: Insufficient documentation

## 2016-02-10 NOTE — Assessment & Plan Note (Signed)
Patient continues to undergo Nivolumab therapy; and received his last treatment on 02/05/2016.  Patient is scheduled.  Return for labs and his next cycle of treatment on 02/19/2016.

## 2016-02-10 NOTE — Assessment & Plan Note (Signed)
Patient reports a 2 to three-week history of some mild epigastric discomfort.  He states that it feels like heartburn; and it occasionally makes him feel nauseous.  He denies any diarrhea or constipation.  He denies any recent fevers or chills.  Patient states he has been able to eat and drink with no discomfort whatsoever.  Exam today reveals abdomen soft and nontender with palpation.  Bowel sounds are positive in all 4 quads.  There is no flank pain.  Patient may very well be experiencing some GERD symptoms.  Patient has tried no over-the-counter medication so far.  Will prescribe patient Zofran to take as needed for nausea.  Also, advised the patient try Prilosec to see if this helps.  Patient was advised to call/return or go directed to the emergency department for any worsening symptoms whatsoever.

## 2016-02-10 NOTE — Progress Notes (Signed)
SYMPTOM MANAGEMENT CLINIC    Chief Complaint: Epigastric pain  HPI:  Christian Simmons 30 y.o. male diagnosed with head/neck cancer; with lung metastasis.  Currently undergoing nivolumab therapy.   Patient reports a 2 to three-week history of some mild epigastric discomfort.  He states that it feels like heartburn; and it occasionally makes him feel nauseous.  He denies any diarrhea or constipation.  He denies any recent fevers or chills.  Patient states he has been able to eat and drink with no discomfort whatsoever.  Exam today reveals abdomen soft and nontender with palpation.  Bowel sounds are positive in all 4 quads.  There is no flank pain.  Patient may very well be experiencing some GERD symptoms.  Patient has tried no over-the-counter medication so far.  Will prescribe patient Zofran to take as needed for nausea.  Also, advised the patient try Prilosec to see if this helps.  Patient was advised to call/return or go directed to the emergency department for any worsening symptoms whatsoever.  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 Pan-endoscopy elsewhere showed cancer from nasopharyngeal space.   01/04/2007 - 02/20/2007 Chemotherapy He received 2 cycles of cisplatin and 5FU followed by concurrent chemo with weekly cisplatin and radiation. He only received 2 doses of chemo due to severe mucositis, nausea and weight loss.   04/05/2007 - 08/04/2007 Chemotherapy He received 4 more courses of cisplatin with 5FU and had complete response   07/05/2009 Procedure Fine-needle aspirate of the right level II lymph nodes come from recurrent metastatic disease. Repeat endoscopy and CT scan show no evidence of disease elsewhere.   07/08/2009 - 12/02/2009 Chemotherapy He was given 6 cycles of carboplatin, 5-FU and docetaxel   12/03/2009 Surgery He has surgery to the residual lymph node on the right neck which showed no evidence of disease.   02/22/2012 Imaging Repeat imaging study showed large recurrent mass. He was referred elsewhere for further treatment.   05/03/2012 Surgery He underwent left upper lobectomy.   04/29/2013 Imaging PEt scan showed lesion on right level II B and lower lung was abnormal   06/03/2013 - 02/02/2014 Chemotherapy He had 6 cycles of chemotherapy when he was found to have recurrence of cancer and had received oxaliplatin and capecitabine   06/07/2014 Imaging PET CT scan showed persistent disease in the right neck lymph nodes and left lung   06/29/2014 Procedure Accession: [id number] repeat LUL biopsy confirmed metastatic cancer   07/18/2014 - 07/31/2014 Radiation Therapy He received palliative radiation therapy to the lungs   10/10/2014 Imaging CT scan of the chest, abdomen and pelvis show regression in the size of the lung nodule in the left upper lobe and stable pulmonary nodules   01/24/2015 Imaging CT scan showed stable disease in neck and lung   06/19/2015 Imaging CT scan of the neck and the chest show possible mild progression of the nodule in the right side of the neck.   06/25/2015 Imaging PET scan confirmed disease recurrence in the neck  07/07/2015 Imaging He had MRI neck at [facility]   09/03/2015 -  Chemotherapy He received palliative chemo with Nivolumab   10/29/2015 Imaging PET CT showed positive response to Rx    Review of Systems  Constitutional: Negative for fever, chills and weight loss.  Gastrointestinal: Positive for heartburn, nausea and abdominal pain. Negative for vomiting.  All other systems reviewed and are  negative.   Past Medical History  Diagnosis Date  . Neuropathy ([place])   . Fatigue 06/01/2014  . Hypothyroidism   . Radiation 07/18/14-07/31/14    Left upper lobe  40 gy in 10 fractions  . 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 Arrhythmia 12/25/2015  . Infection of eyelash follicle of left eye 01/16/2439    Past Surgical History  Procedure Laterality Date  . Lung removal, partial  05/03/2012    left upper lobectomy  . Radical neck dissection    . Nasal biopsy    . Video bronchoscopy N/A 06/29/2014    Procedure: VIDEO BRONCHOSCOPY ;  Surgeon: Melrose Nakayama, MD;  Location: [facility];  Service: Thoracic;  Laterality: N/A;    has Primary cancer of head and neck ([place]); Metastasis to lung [facility]); Chronic fatigue; Acquired hypothyroidism; Neuropathy due to chemotherapeutic drug ([place]); Neck pain on right side; Other constipation; Poor dentition; Arrhythmia; Infection of eyelash follicle of left eye; Gastritis without bleeding; and Epigastric pain on his problem list.    has No Known Allergies.    Medication List       This list is accurate as of: 02/07/16 11:59 PM.  Always use your most recent med list.               levothyroxine 100 MCG tablet  Commonly known as:  SYNTHROID, LEVOTHROID  TAKE 1 TABLET(100 MCG) BY MOUTH DAILY BEFORE BREAKFAST     lidocaine-prilocaine cream  Commonly known as:  EMLA  Apply 1 application topically as needed.     omeprazole 20 MG capsule  Commonly known as:  PRILOSEC  TAKE ONE CAPSULE BY MOUTH TWICE DAILY BEFORE A MEAL     ondansetron 8 MG tablet  Commonly known as:  ZOFRAN  Take 1 tablet (8 mg total) by mouth every 8 (eight) hours as needed for nausea or vomiting.     promethazine 25 MG tablet  Commonly known as:  PHENERGAN  Take 1 tablet (25 mg total) by mouth every 6 (six) hours as needed for nausea or vomiting.         PHYSICAL EXAMINATION  Oncology Vitals 02/07/2016 02/05/2016  Height 168 cm -    Weight 77.792 kg -  Weight (lbs) 171 lbs 8 oz -  BMI (kg/m2) 27.68 kg/m2 -  Temp 98.3 97.8  Pulse 88 80  Resp 18 -  SpO2 100 100  BSA (m2) 1.9 m2 -   BP Readings from Last 2 Encounters:  02/07/16 114/66  02/05/16 135/67    Physical Exam  Constitutional: He is oriented to person, place, and time and well-developed, well-nourished, and in no distress.  HENT:  Head: Normocephalic and atraumatic.  Mouth/Throat: Oropharynx is clear and moist.  Eyes: Conjunctivae and EOM are normal. Pupils are equal, round, and reactive to light. Right eye exhibits no discharge. Left eye exhibits no discharge. No scleral icterus.  Neck: Normal range of motion. Neck supple. No JVD present. No tracheal deviation present. No thyromegaly present.  Cardiovascular: Normal rate, regular rhythm, normal heart sounds and intact distal pulses.  Pulmonary/Chest: Effort normal and breath sounds normal. No respiratory distress. He has no wheezes. He has no rales. He exhibits no tenderness.  Abdominal: Soft. Bowel sounds are normal. He exhibits no distension and no mass. There is no tenderness. There is no rebound and no guarding.  Musculoskeletal: Normal range of motion. He exhibits no edema or tenderness.  Lymphadenopathy:    He has no cervical adenopathy.  Neurological: He is alert and oriented to person, place, and time. Gait normal.  Skin: Skin is warm and dry. No rash noted. No erythema. No pallor.  Psychiatric: Affect normal.  Nursing note and vitals reviewed.   LABORATORY DATA:. Appointment on 02/05/2016  Component Date Value Ref Range Status  . WBC 02/05/2016 5.0  4.0 - 10.3 10e3/uL Final  . NEUT# 02/05/2016 3.0  1.5 - 6.5 10e3/uL Final  . HGB 02/05/2016 15.7  13.0 - 17.1 g/dL Final  . HCT 02/05/2016 48.0  38.4 - 49.9 % Final  . Platelets 02/05/2016 207  140 - 400 10e3/uL Final  . MCV 02/05/2016 87.9  79.3 - 98.0 fL Final  . MCH 02/05/2016 28.7  27.2 - 33.4 pg Final  . MCHC 02/05/2016 32.7  32.0 -  36.0 g/dL Final  . RBC 02/05/2016 5.46  4.20 - 5.82 10e6/uL Final  . RDW 02/05/2016 15.1* 11.0 - 14.6 % Final  . lymph# 02/05/2016 1.3  0.9 - 3.3 10e3/uL Final  . MONO# 02/05/2016 0.5  0.1 - 0.9 10e3/uL Final  . Eosinophils Absolute 02/05/2016 0.1  0.0 - 0.5 10e3/uL Final  . Basophils Absolute 02/05/2016 0.0  0.0 - 0.1 10e3/uL Final  . NEUT% 02/05/2016 61.0  39.0 - 75.0 % Final  . LYMPH% 02/05/2016 25.9  14.0 - 49.0 % Final  . MONO% 02/05/2016 11.0  0.0 - 14.0 % Final  . EOS% 02/05/2016 1.5  0.0 - 7.0 % Final  . BASO% 02/05/2016 0.6  0.0 - 2.0 % Final  . Sodium 02/05/2016 141  136 - 145 mEq/L Final  . Potassium 02/05/2016 3.6  3.5 - 5.1 mEq/L Final  . Chloride 02/05/2016 106  98 - 109 mEq/L Final  . CO2 02/05/2016 25  22 - 29 mEq/L Final  . Glucose 02/05/2016 87  70 - 140 mg/dl Final   Glucose reference range is for nonfasting patients. Fasting glucose reference range is 70- 100.  Marland Kitchen BUN 02/05/2016 10.9  7.0 - 26.0 mg/dL Final  . Creatinine 02/05/2016 1.0  0.7 - 1.3 mg/dL Final  . Total Bilirubin 02/05/2016 0.48  0.20 - 1.20 mg/dL Final  . Alkaline Phosphatase 02/05/2016 62  40 - 150 U/L Final  . AST 02/05/2016 23  5 - 34 U/L Final  . ALT 02/05/2016 29  0 - 55 U/L Final  . Total Protein 02/05/2016 7.0  6.4 - 8.3 g/dL Final  . Albumin 02/05/2016 4.2  3.5 - 5.0 g/dL Final  . Calcium 02/05/2016 9.6  8.4 - 10.4 mg/dL Final  . Anion Gap 02/05/2016 11  3 - 11 mEq/L Final  . EGFR 02/05/2016 >90  >90 ml/min/1.73 m2 Final   eGFR is calculated using the CKD-EPI Creatinine Equation (2009)    RADIOGRAPHIC STUDIES: No results found.  ASSESSMENT/PLAN:    Primary cancer of head and neck ([place]) Patient continues to undergo Nivolumab therapy; and received his last treatment on 02/05/2016.  Patient is scheduled.  Return for labs and his next cycle of treatment on 02/19/2016.  Epigastric pain Patient reports a 2 to three-week history of some mild  epigastric discomfort.  He states that it feels  like heartburn; and it occasionally makes him feel nauseous.  He denies any diarrhea or constipation.  He denies any recent fevers or chills.  Patient states he has been able to eat and drink with no discomfort whatsoever.  Exam today reveals abdomen soft and nontender with palpation.  Bowel sounds are positive in all 4 quads.  There is no flank pain.  Patient may very well be experiencing some GERD symptoms.  Patient has tried no over-the-counter medication so far.  Will prescribe patient Zofran to take as needed for nausea.  Also, advised the patient try Prilosec to see if this helps.  Patient was advised to call/return or go directed to the emergency department for any worsening symptoms whatsoever.   Patient stated understanding of all instructions; and was in agreement with this plan of care. The patient knows to call the clinic with any problems, questions or concerns.   Total time spent with patient was 25 minutes;  with greater than 75 percent of that time spent in face to face counseling regarding patient's symptoms,  and coordination of care and follow up.  Disclaimer:This dictation was prepared with Dragon/digital dictation along with [employer]. Any transcriptional errors that result from this process are unintentional.  Drue Second, NP 02/10/2016

## 2016-02-19 ENCOUNTER — Telehealth: Payer: Self-pay | Admitting: *Deleted

## 2016-02-19 ENCOUNTER — Other Ambulatory Visit: Payer: Medicaid Other

## 2016-02-19 ENCOUNTER — Other Ambulatory Visit (HOSPITAL_BASED_OUTPATIENT_CLINIC_OR_DEPARTMENT_OTHER): Payer: Medicaid Other

## 2016-02-19 ENCOUNTER — Ambulatory Visit (HOSPITAL_BASED_OUTPATIENT_CLINIC_OR_DEPARTMENT_OTHER): Payer: Medicaid Other

## 2016-02-19 ENCOUNTER — Telehealth: Payer: Self-pay | Admitting: Gastroenterology

## 2016-02-19 VITALS — BP 134/78 | HR 74 | Temp 98.4°F | Resp 18

## 2016-02-19 DIAGNOSIS — C760 Malignant neoplasm of head, face and neck: Secondary | ICD-10-CM

## 2016-02-19 DIAGNOSIS — Z5112 Encounter for antineoplastic immunotherapy: Secondary | ICD-10-CM

## 2016-02-19 DIAGNOSIS — C7800 Secondary malignant neoplasm of unspecified lung: Secondary | ICD-10-CM | POA: Diagnosis not present

## 2016-02-19 DIAGNOSIS — C119 Malignant neoplasm of nasopharynx, unspecified: Secondary | ICD-10-CM

## 2016-02-19 DIAGNOSIS — R1013 Epigastric pain: Secondary | ICD-10-CM

## 2016-02-19 DIAGNOSIS — K2970 Gastritis, unspecified, without bleeding: Secondary | ICD-10-CM

## 2016-02-19 LAB — COMPREHENSIVE METABOLIC PANEL
ALT: 23 U/L (ref 0–55)
ANION GAP: 7 meq/L (ref 3–11)
AST: 22 U/L (ref 5–34)
Albumin: 3.8 g/dL (ref 3.5–5.0)
Alkaline Phosphatase: 56 U/L (ref 40–150)
BILIRUBIN TOTAL: 0.39 mg/dL (ref 0.20–1.20)
BUN: 13.7 mg/dL (ref 7.0–26.0)
CHLORIDE: 106 meq/L (ref 98–109)
CO2: 25 meq/L (ref 22–29)
Calcium: 9.3 mg/dL (ref 8.4–10.4)
Creatinine: 0.9 mg/dL (ref 0.7–1.3)
GLUCOSE: 88 mg/dL (ref 70–140)
POTASSIUM: 3.9 meq/L (ref 3.5–5.1)
SODIUM: 138 meq/L (ref 136–145)
Total Protein: 6.5 g/dL (ref 6.4–8.3)

## 2016-02-19 LAB — CBC WITH DIFFERENTIAL/PLATELET
BASO%: 0.6 % (ref 0.0–2.0)
Basophils Absolute: 0 10*3/uL (ref 0.0–0.1)
EOS ABS: 0.1 10*3/uL (ref 0.0–0.5)
EOS%: 1.4 % (ref 0.0–7.0)
HCT: 44.8 % (ref 38.4–49.9)
HEMOGLOBIN: 15.1 g/dL (ref 13.0–17.1)
LYMPH%: 23.2 % (ref 14.0–49.0)
MCH: 29.3 pg (ref 27.2–33.4)
MCHC: 33.6 g/dL (ref 32.0–36.0)
MCV: 87.2 fL (ref 79.3–98.0)
MONO#: 0.8 10*3/uL (ref 0.1–0.9)
MONO%: 11.8 % (ref 0.0–14.0)
NEUT%: 63 % (ref 39.0–75.0)
NEUTROS ABS: 4 10*3/uL (ref 1.5–6.5)
Platelets: 224 10*3/uL (ref 140–400)
RBC: 5.14 10*6/uL (ref 4.20–5.82)
RDW: 14.8 % — AB (ref 11.0–14.6)
WBC: 6.4 10*3/uL (ref 4.0–10.3)
lymph#: 1.5 10*3/uL (ref 0.9–3.3)

## 2016-02-19 MED ORDER — SODIUM CHLORIDE 0.9 % IV SOLN
240.0000 mg | Freq: Once | INTRAVENOUS | Status: AC
  Administered 2016-02-19: 240 mg via INTRAVENOUS
  Filled 2016-02-19: qty 20

## 2016-02-19 MED ORDER — SODIUM CHLORIDE 0.9 % IV SOLN
Freq: Once | INTRAVENOUS | Status: AC
  Administered 2016-02-19: 14:00:00 via INTRAVENOUS

## 2016-02-19 NOTE — Telephone Encounter (Signed)
Notified pt of Referral placed to [facility].  Take omeprazole twice a day every day until directed otherwise.  Please call us if he does not hear from GI by Monday afternoon.  He verbalized understanding.

## 2016-02-19 NOTE — Telephone Encounter (Signed)
Pls refer to Christian Simmons

## 2016-02-19 NOTE — Telephone Encounter (Signed)
Appointment scheduled.

## 2016-02-19 NOTE — Telephone Encounter (Signed)
Spoke w/ pt in Infusion room.  He c/o ongoing epigastric pain described as "gnawing" and "hunger" like pains.  States unaltered by eating or not eating.  He admittedly has not been taking Prilosec as ordered twice daily.  He says it didn't help so he hasn't taken it in 2 days and when he did take it only once a day.  Pt also c/o ongoing nausea daily w/ vomiting episode at least once a day.  He states occasional blood tinged emesis and sometimes "choking" with vomiting.   States also taking Tums daily w/o any relief of symptoms.  He states only "nausea medication" seems to help w/ the nausea but not the pain.   Pt agrees to referral to [facility] if Dr. Alvy Bimler thinks that is necessary.  Instructed pt to take Prilosec twice daily every day as ordered and I will call him with any other orders from Dr. Alvy Bimler and referral if Dr. Rozell Searing it is indicated.  Pt verbalized understanding.

## 2016-02-19 NOTE — Telephone Encounter (Signed)
Please schedule with APP early next week

## 2016-02-19 NOTE — Patient Instructions (Signed)
[facility] Discharge Instructions for Patients Receiving Chemotherapy  Today you received the following chemotherapy agents:  Nivolumab.  To help prevent nausea and vomiting after your treatment, we encourage you to take your nausea medication as directed.   If you develop nausea and vomiting that is not controlled by your nausea medication, call the clinic.   BELOW ARE SYMPTOMS THAT SHOULD BE REPORTED IMMEDIATELY:  *FEVER GREATER THAN 100.5 F  *CHILLS WITH OR WITHOUT FEVER  NAUSEA AND VOMITING THAT IS NOT CONTROLLED WITH YOUR NAUSEA MEDICATION  *UNUSUAL SHORTNESS OF BREATH  *UNUSUAL BRUISING OR BLEEDING  TENDERNESS IN MOUTH AND THROAT WITH OR WITHOUT PRESENCE OF ULCERS  *URINARY PROBLEMS  *BOWEL PROBLEMS  UNUSUAL RASH Items with * indicate a potential emergency and should be followed up as soon as possible.  Feel free to call the clinic you have any questions or concerns. The clinic phone number is [phone].  Please show the CHEMO ALERT CARD at check-in to the Emergency Department and triage nurse.   

## 2016-02-24 ENCOUNTER — Encounter: Payer: Self-pay | Admitting: Physician Assistant

## 2016-02-24 ENCOUNTER — Ambulatory Visit (INDEPENDENT_AMBULATORY_CARE_PROVIDER_SITE_OTHER): Payer: Medicaid Other | Admitting: Physician Assistant

## 2016-02-24 ENCOUNTER — Telehealth: Payer: Self-pay | Admitting: *Deleted

## 2016-02-24 VITALS — BP 100/58 | HR 80 | Ht 67.72 in | Wt 167.0 lb

## 2016-02-24 DIAGNOSIS — R112 Nausea with vomiting, unspecified: Secondary | ICD-10-CM | POA: Diagnosis not present

## 2016-02-24 DIAGNOSIS — Z8589 Personal history of malignant neoplasm of other organs and systems: Secondary | ICD-10-CM

## 2016-02-24 DIAGNOSIS — R1013 Epigastric pain: Secondary | ICD-10-CM

## 2016-02-24 DIAGNOSIS — C760 Malignant neoplasm of head, face and neck: Secondary | ICD-10-CM

## 2016-02-24 DIAGNOSIS — R634 Abnormal weight loss: Secondary | ICD-10-CM | POA: Diagnosis not present

## 2016-02-24 MED ORDER — ONDANSETRON HCL 8 MG PO TABS
8.0000 mg | ORAL_TABLET | Freq: Three times a day (TID) | ORAL | Status: DC | PRN
Start: 2016-02-24 — End: 2016-04-15

## 2016-02-24 NOTE — Telephone Encounter (Signed)
Called the patient to advise we are going to cancel the Endoscopy for Wed 5-24 in our [place] location. We still want him to have the CT scan on Friday 02-28-16.  We will call him to schedule the EGD at our [facility] Endouscopy Unit with Dr. Henrene Pastor. We feel it might be best since he has had neck surgery in the past.  The patient verbalized understanding.

## 2016-02-24 NOTE — Progress Notes (Signed)
Patient ID: Christian Simmons, male   DOB: 08-02-1986, 30 y.o.   MRN: [id number]   Subjective:    Patient ID: Christian Simmons, male    DOB: 04/18/1986, 30 y.o.   MRN: [id number]  HPI  Christian Simmons  Is a pleasant 30 year old male , referred today by Dr. Blenda Bridegroom, he is new to GI. Patient unfortunately was diagnosed with head and neck cancer in 2007 and has been undergoing treatment intermittently ever since. He says his disease was initially in the nasopharynx and then spread to his neck and currently has known metastatic disease to the lung. He is on chemotherapy with Nivolumab every other week. He is referred to [place] because of complaints of upper abdominal pain. He says he is been having difficulty over the past 4-6 weeks with a gnawing hunger type pain in his epigastrium that will not go away. He says this is not necessarily better or worse with food. His appetite has been decreased and he has had some nausea  And frequent vomiting. He denies any dysphagia or odynophagia. His weight is down about 6 pounds over the past month. He has not had any documented fever or chills, no changes in his bowel habits melena or hematochezia. He has not been taking any regular aspirin or NSAIDs. Most recent labs done 517 2017 hemoglobin 15.1 hematocrit of 40 6 0.8 see met unremarkable.  most recent PET scan was done in January 2017 showed some new activity in the stomach raising question of gastritis there was also persistent mass posterior to the right ear and left upper lobe consolidation consistent with metastatic disease.  Review of Systems Pertinent positive and negative review of systems were noted in the above HPI section.  All other review of systems was otherwise negative.  Outpatient Encounter Prescriptions as of 02/24/2016  Medication Sig  . levothyroxine (SYNTHROID, LEVOTHROID) 100 MCG tablet TAKE 1 TABLET(100 MCG) BY MOUTH DAILY BEFORE BREAKFAST  . omeprazole (PRILOSEC) 20 MG capsule TAKE ONE CAPSULE  BY MOUTH TWICE DAILY BEFORE A MEAL  . ondansetron (ZOFRAN) 8 MG tablet Take 1 tablet (8 mg total) by mouth every 8 (eight) hours as needed for nausea or vomiting.  . [DISCONTINUED] ondansetron (ZOFRAN) 8 MG tablet Take 1 tablet (8 mg total) by mouth every 8 (eight) hours as needed for nausea or vomiting.  . [DISCONTINUED] lidocaine-prilocaine (EMLA) cream Apply 1 application topically as needed. (Patient not taking: Reported on 02/07/2016)  . [DISCONTINUED] promethazine (PHENERGAN) 25 MG tablet Take 1 tablet (25 mg total) by mouth every 6 (six) hours as needed for nausea or vomiting. (Patient not taking: Reported on 02/07/2016)   No facility-administered encounter medications on file as of 02/24/2016.   No Known Allergies Patient Active Problem List   Diagnosis Date Noted  . Epigastric pain 02/10/2016  . Gastritis without bleeding 01/22/2016  . Arrhythmia 12/25/2015  . Infection of eyelash follicle of left eye 87/86/7672  . Poor dentition 10/16/2015  . Other constipation 05/16/2015  . Neck pain on right side 01/28/2015  . Neuropathy due to chemotherapeutic drug ([place]) 10/25/2014  . Acquired hypothyroidism 06/09/2014  . Primary cancer of head and neck ([place]) 06/01/2014  . 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 Chronic fatigue 06/01/2014   Social History   Social History  . Marital Status: Married    Spouse Name: N/A  . Number of Children: 1  . Years of Education: N/A   Occupational History  . Not on file.   Social History Main Topics  .  Smoking status: Never Smoker   . Smokeless tobacco: Never Used  . Alcohol Use: No  . Drug Use: No  . Sexual Activity: Not on file   Other Topics Concern  . Not on file   Social History Narrative    Christian Simmons family history includes Diabetes in his father and mother; Hypertension in his father and mother.      Objective:    Filed Vitals:   02/24/16 0908  BP: 100/58  Pulse: 80    Physical Exam    Well-developed thin male in no  acute distress, pleasant blood pressure 100/58 pulse 80 height 5 foot 7 weight 167. HEENT nontraumatic normocephalic EOMI PERRLA sclera anicteric, neck supple he has had prior resection to the right neck , Cardiovascular ;regular rate and rhythm with S1-S2 no murmur or gallop, Pulmonary; clear bilaterally, Abdomen; soft he is tender in the epigastrium right upper quadrant and right mid quadrant there is no definite palpable mass no definite palpable hepatosplenomegaly bowel sounds are present is some firmness in the right upper quadrant and epigastrium , rectal exam not done, Ext; no clubbing cyanosis or edema skin warm and dry, Neuropsych ;mood and affect appropriate   IMP/PLAN:   #63  30 year old male with head and neck cancer initially diagnosed in 2007 with ongoing intermittent treatment over the past 10 years. He is known to have metastatic disease to the lungs and has been undergoing chemotherapy. Appendectomy is had 4-6 week history of new epigastric pain intermittent nausea and vomiting and weight loss of about 6 pounds. He has not had any prior known involvement in his abdomen.  Am concerned she may have developed metastatic disease to the stomach or abdominal cavity. Other considerations are severe gastritis or peptic ulcer disease   #2 status post resection of the right lateral neck     Plan; Will schedule for EGD with Dr. Henrene Pastor later this week. Procedure discussed in detail with patient including risks and benefits and he is agreeable to proceed. Have also scheduled for CT of the abdomen and pelvis with contrast.  Continue Zofran 4 mg BID  Add omeprazole 20 mg by mouth twice a day  Have asked patient to eat very small frequent meals soft bland diet and add protein supplements at least twice daily in the form of shakes /and sure or boost:Ensure or Boost 02/24/2016   Cc: No ref. provider found

## 2016-02-24 NOTE — Progress Notes (Signed)
Reviewed with physician assistant. Unfortunate case. Could be having abdominal discomfort for multiple reasons. Would hold off on endoscopy for now as he is high-risk potentially for sedation with altered pharyngeal anatomy from prior surgery. Agree with CT scan as next step.

## 2016-02-24 NOTE — Patient Instructions (Addendum)
Continue Zofran 4 mg, 1 tab by mouth every 6-8 hours for nausea.  Continue Omeprazole 20 mg, take 1 tab twice daily.  Eat small frequent meals. Add protein shakes twice daily.    If you are age 30 or younger, your body mass index should be between 19-25. Your Body mass index is 25.61 kg/(m^2). If this is out of the aformentioned range listed, please consider follow up with your Primary Care Provider.   You have been scheduled for an endoscopy. Please follow written instructions given to you at your visit today. If you use inhalers (even only as needed), please bring them with you on the day of your procedure. Your physician has requested that you go to www.startemmi.com and enter the access code given to you at your visit today. This web site gives a general overview about your procedure. However, you should still follow specific instructions given to you by our office regarding your preparation for the procedure.   You have been scheduled for a CT scan of the abdomen and pelvis at [facility] ([id number]---this is in the same building as Press [facility]).   You are scheduled on Friday 02-28-2016 at 2:00 PM. You should arrive at 2:15   to your appointment time for registration. Please follow the written instructions below on the day of your exam:  WARNING: IF YOU ARE ALLERGIC TO IODINE/X-RAY DYE, PLEASE NOTIFY RADIOLOGY IMMEDIATELY AT [phone]! YOU WILL BE GIVEN A 13 HOUR PREMEDICATION PREP.  1) Do not eat  anything after 10:00 am (4 hours prior to your test) 2) You have been given 2 bottles of oral contrast to drink. The solution may taste  better if refrigerated, but do NOT add ice or any other liquid to this solution. Shake  well before drinking.    Drink 1 bottle of contrast @ 1:00 PM (2 hours prior to your exam)  Drink 1 bottle of contrast @ 2:00 PM (1 hour prior to your exam)  You may take any medications as prescribed with a small amount of water except for the  following: Metformin, Glucophage, Glucovance, Avandamet, Riomet, Fortamet, Actoplus Met, Janumet, Glumetza or Metaglip. The above medications must be held the day of the exam AND 48 hours after the exam.  The purpose of you drinking the oral contrast is to aid in the visualization of your intestinal tract. The contrast solution may cause some diarrhea. Before your exam is started, you will be given a small amount of fluid to drink. Depending on your individual set of symptoms, you may also receive an intravenous injection of x-ray contrast/dye. Plan on being at [facility] for 30 minutes or long, depending on the type of exam you are having performed.  If you have any questions regarding your exam or if you need to reschedule, you may call the CT department at [phone] between the hours of 8:00 am and 5:00 pm, Monday-Friday.  ________________________________________________________________________

## 2016-02-26 ENCOUNTER — Encounter: Payer: Medicaid Other | Admitting: Internal Medicine

## 2016-02-28 ENCOUNTER — Ambulatory Visit (INDEPENDENT_AMBULATORY_CARE_PROVIDER_SITE_OTHER)
Admission: RE | Admit: 2016-02-28 | Discharge: 2016-02-28 | Disposition: A | Discharge status: Home / Self Care | Payer: Medicaid Other | Source: Ambulatory Visit | Attending: Physician Assistant | Admitting: Physician Assistant

## 2016-02-28 DIAGNOSIS — Z8589 Personal history of malignant neoplasm of other organs and systems: Secondary | ICD-10-CM

## 2016-02-28 DIAGNOSIS — R1013 Epigastric pain: Secondary | ICD-10-CM

## 2016-02-28 DIAGNOSIS — R634 Abnormal weight loss: Secondary | ICD-10-CM

## 2016-02-28 DIAGNOSIS — R112 Nausea with vomiting, unspecified: Secondary | ICD-10-CM

## 2016-02-28 MED ORDER — IOPAMIDOL (ISOVUE-300) INJECTION 61%
100.0000 mL | Freq: Once | INTRAVENOUS | Status: AC | PRN
  Administered 2016-02-28: 75 mL via INTRAVENOUS

## 2016-03-03 ENCOUNTER — Encounter (HOSPITAL_COMMUNITY): Payer: Self-pay

## 2016-03-03 ENCOUNTER — Ambulatory Visit (HOSPITAL_COMMUNITY)
Admission: RE | Admit: 2016-03-03 | Discharge: 2016-03-03 | Disposition: A | Discharge status: Home / Self Care | Payer: Medicaid Other | Source: Ambulatory Visit | Attending: Hematology and Oncology | Admitting: Hematology and Oncology

## 2016-03-03 DIAGNOSIS — C760 Malignant neoplasm of head, face and neck: Secondary | ICD-10-CM | POA: Insufficient documentation

## 2016-03-03 MED ORDER — IOPAMIDOL (ISOVUE-300) INJECTION 61%
100.0000 mL | Freq: Once | INTRAVENOUS | Status: AC | PRN
  Administered 2016-03-03: 75 mL via INTRAVENOUS

## 2016-03-04 ENCOUNTER — Other Ambulatory Visit: Payer: Self-pay | Admitting: Hematology and Oncology

## 2016-03-04 ENCOUNTER — Ambulatory Visit: Payer: Medicaid Other

## 2016-03-04 ENCOUNTER — Telehealth: Payer: Self-pay | Admitting: *Deleted

## 2016-03-04 ENCOUNTER — Other Ambulatory Visit: Payer: Self-pay

## 2016-03-04 ENCOUNTER — Other Ambulatory Visit (HOSPITAL_BASED_OUTPATIENT_CLINIC_OR_DEPARTMENT_OTHER): Payer: Medicaid Other

## 2016-03-04 ENCOUNTER — Ambulatory Visit (HOSPITAL_BASED_OUTPATIENT_CLINIC_OR_DEPARTMENT_OTHER): Payer: Medicaid Other | Admitting: Hematology and Oncology

## 2016-03-04 ENCOUNTER — Encounter: Payer: Self-pay | Admitting: Hematology and Oncology

## 2016-03-04 ENCOUNTER — Ambulatory Visit (HOSPITAL_BASED_OUTPATIENT_CLINIC_OR_DEPARTMENT_OTHER): Payer: Medicaid Other

## 2016-03-04 ENCOUNTER — Telehealth: Payer: Self-pay | Admitting: Hematology and Oncology

## 2016-03-04 VITALS — BP 114/71 | HR 67 | Resp 20 | Ht 67.72 in | Wt 166.9 lb

## 2016-03-04 DIAGNOSIS — C760 Malignant neoplasm of head, face and neck: Secondary | ICD-10-CM

## 2016-03-04 DIAGNOSIS — C7800 Secondary malignant neoplasm of unspecified lung: Secondary | ICD-10-CM

## 2016-03-04 DIAGNOSIS — E039 Hypothyroidism, unspecified: Secondary | ICD-10-CM

## 2016-03-04 DIAGNOSIS — C7801 Secondary malignant neoplasm of right lung: Secondary | ICD-10-CM | POA: Diagnosis not present

## 2016-03-04 DIAGNOSIS — Z5112 Encounter for antineoplastic immunotherapy: Secondary | ICD-10-CM | POA: Diagnosis not present

## 2016-03-04 DIAGNOSIS — R1013 Epigastric pain: Secondary | ICD-10-CM

## 2016-03-04 DIAGNOSIS — C119 Malignant neoplasm of nasopharynx, unspecified: Secondary | ICD-10-CM

## 2016-03-04 LAB — CBC WITH DIFFERENTIAL/PLATELET
BASO%: 0.2 % (ref 0.0–2.0)
BASOS ABS: 0 10*3/uL (ref 0.0–0.1)
EOS ABS: 0.1 10*3/uL (ref 0.0–0.5)
EOS%: 1.1 % (ref 0.0–7.0)
HEMATOCRIT: 42.9 % (ref 38.4–49.9)
HEMOGLOBIN: 14.8 g/dL (ref 13.0–17.1)
LYMPH#: 1.4 10*3/uL (ref 0.9–3.3)
LYMPH%: 25.2 % (ref 14.0–49.0)
MCH: 30.3 pg (ref 27.2–33.4)
MCHC: 34.5 g/dL (ref 32.0–36.0)
MCV: 87.7 fL (ref 79.3–98.0)
MONO#: 0.6 10*3/uL (ref 0.1–0.9)
MONO%: 11 % (ref 0.0–14.0)
NEUT#: 3.4 10*3/uL (ref 1.5–6.5)
NEUT%: 62.5 % (ref 39.0–75.0)
PLATELETS: 210 10*3/uL (ref 140–400)
RBC: 4.89 10*6/uL (ref 4.20–5.82)
RDW: 14.1 % (ref 11.0–14.6)
WBC: 5.5 10*3/uL (ref 4.0–10.3)
nRBC: 0 % (ref 0–0)

## 2016-03-04 LAB — COMPREHENSIVE METABOLIC PANEL
ALBUMIN: 3.8 g/dL (ref 3.5–5.0)
ALK PHOS: 50 U/L (ref 40–150)
ALT: 18 U/L (ref 0–55)
AST: 19 U/L (ref 5–34)
Anion Gap: 9 mEq/L (ref 3–11)
BUN: 13.2 mg/dL (ref 7.0–26.0)
CALCIUM: 9.3 mg/dL (ref 8.4–10.4)
CO2: 23 mEq/L (ref 22–29)
CREATININE: 1 mg/dL (ref 0.7–1.3)
Chloride: 108 mEq/L (ref 98–109)
EGFR: >90 mL/min/{1.73_m2} (ref >90)
Glucose: 110 mg/dl (ref 70–140)
POTASSIUM: 4 meq/L (ref 3.5–5.1)
Sodium: 140 mEq/L (ref 136–145)
Total Bilirubin: 0.34 mg/dL (ref 0.20–1.20)
Total Protein: 6.3 g/dL — ABNORMAL LOW (ref 6.4–8.3)

## 2016-03-04 MED ORDER — SODIUM CHLORIDE 0.9 % IJ SOLN
10.0000 mL | INTRAMUSCULAR | Status: DC | PRN
  Filled 2016-03-04: qty 10

## 2016-03-04 MED ORDER — NIVOLUMAB CHEMO INJECTION 100 MG/10ML
240.0000 mg | Freq: Once | INTRAVENOUS | Status: AC
  Administered 2016-03-04: 240 mg via INTRAVENOUS
  Filled 2016-03-04: qty 8

## 2016-03-04 MED ORDER — SODIUM CHLORIDE 0.9 % IV SOLN
Freq: Once | INTRAVENOUS | Status: AC
  Administered 2016-03-04: 13:00:00 via INTRAVENOUS

## 2016-03-04 NOTE — Progress Notes (Signed)
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 Pan-endoscopy elsewhere showed cancer from nasopharyngeal space.   01/04/2007 - 02/20/2007 Chemotherapy He received 2 cycles of cisplatin and 5FU followed by concurrent chemo with weekly cisplatin and radiation. He only received 2 doses of chemo due to severe mucositis, nausea and weight loss.   04/05/2007 - 08/04/2007 Chemotherapy He received 4 more courses of cisplatin with 5FU and had complete response   07/05/2009 Procedure Fine-needle aspirate of the right level II lymph nodes come from recurrent metastatic disease. Repeat endoscopy and CT scan show no evidence of disease elsewhere.   07/08/2009 - 12/02/2009 Chemotherapy He was given 6 cycles of carboplatin, 5-FU and docetaxel   12/03/2009 Surgery He has surgery to the residual lymph node on the right neck which showed no evidence of disease.   02/22/2012 Imaging Repeat imaging study showed large recurrent mass. He was referred elsewhere for further treatment.   05/03/2012 Surgery He underwent left upper lobectomy.   04/29/2013 Imaging PEt scan  showed lesion on right level II B and lower lung was abnormal   06/03/2013 - 02/02/2014 Chemotherapy He had 6 cycles of chemotherapy when he was found to have recurrence of cancer and had received oxaliplatin and capecitabine   06/07/2014 Imaging PET CT scan showed persistent disease in the right neck lymph nodes and left lung   06/29/2014 Procedure Accession: [id number] repeat LUL biopsy confirmed metastatic cancer   07/18/2014 - 07/31/2014 Radiation Therapy He received palliative radiation therapy to the lungs   10/10/2014 Imaging CT scan of the chest, abdomen and pelvis show regression in the size of the lung nodule in the left upper lobe and stable pulmonary nodules   01/24/2015 Imaging CT scan showed stable disease in neck and lung   06/19/2015 Imaging CT scan of the neck and the chest show possible mild progression of the nodule in the right side of the neck.   06/25/2015 Imaging PET scan confirmed disease recurrence in the neck   07/07/2015 Imaging He had [facility] neck [facility]   09/03/2015 -  Chemotherapy He received palliative chemo with Nivolumab   10/29/2015 Imaging PET CT showed positive response to Rx   02/28/2016 Imaging Ct abdomen showed abnormal thinkening in his stomach   03/03/2016 Imaging CT: Right sternocleidomastoid muscle metastasis appears less distinct but otherwise not significantly changed in size or configuration since 06/19/2015.2. Left level 3 lymph node which was hypermetabolic by PET-CT in January 2017 appears slightly smaller    INTERVAL HISTORY: Please see below for problem oriented charting. He returns for further follow-up. He complained of persistent epigastric discomfort. GI evaluation is pending  but he was started on omeprazole. He denies weight loss. No change in bowel habits. He denies the need to take any pain medicine.  REVIEW OF SYSTEMS:   Constitutional: Denies fevers, chills or abnormal weight loss Eyes: Denies blurriness of vision Ears, nose, mouth, throat, and  face: Denies mucositis or sore throat Respiratory: Denies cough, dyspnea or wheezes Cardiovascular: Denies palpitation, chest discomfort or lower extremity swelling Skin: Denies abnormal skin rashes Lymphatics: Denies new lymphadenopathy or easy bruising Neurological:Denies numbness, tingling or new weaknesses Behavioral/Psych: Mood is stable, no new changes  All other systems were reviewed with the patient and are negative.  I have reviewed the past medical history, past surgical history, social history and family history with the patient and they are unchanged from previous note.  ALLERGIES:  has No Known Allergies.  MEDICATIONS:  Current Outpatient Prescriptions  Medication Sig Dispense Refill  . levothyroxine (SYNTHROID, LEVOTHROID) 100 MCG tablet TAKE 1 TABLET(100 MCG) BY MOUTH DAILY BEFORE BREAKFAST 90 tablet 2  . levothyroxine (SYNTHROID, LEVOTHROID) 75 MCG tablet TAKE 1 TABLET BY MOUTH EVERY DAY BEFORE BREAKFAST 90 tablet 0  . omeprazole (PRILOSEC) 20 MG capsule TAKE ONE CAPSULE BY MOUTH TWICE DAILY BEFORE A MEAL 600 capsule 1  . ondansetron (ZOFRAN) 8 MG tablet Take 1 tablet (8 mg total) by mouth every 8 (eight) hours as needed for nausea or vomiting. 40 tablet 1   No current facility-administered medications for this visit.   Facility-Administered Medications Ordered in Other Visits  Medication Dose Route Frequency Provider Last Rate Last Dose  . sodium chloride 0.9 % injection 10 mL  10 mL Intracatheter PRN [name], MD        PHYSICAL EXAMINATION: ECOG PERFORMANCE STATUS: 0 - Asymptomatic  Filed Vitals:   03/04/16 1101  BP: 114/71  Pulse: 67  Resp: 20   Filed Weights   03/04/16 1101  Weight: 166 lb 14.4 oz (75.705 kg)    GENERAL:alert, no distress and comfortable SKIN: skin color, texture, turgor are normal, no rashes or significant lesions EYES: normal, Conjunctiva are pink and non-injected, sclera clear OROPHARYNX:no exudate, no erythema and lips, buccal  mucosa, and tongue normal  NECK: supple, thyroid normal size, non-tender, without nodularity LYMPH:  no palpable lymphadenopathy in the cervical, axillary or inguinal LUNGS: clear to auscultation and percussion with normal breathing effort HEART: regular rate & rhythm and no murmurs and no lower extremity edema ABDOMEN:abdomen soft,Mild epigastric discomfort without rebound and normal bowel sounds Musculoskeletal:no cyanosis of digits and no clubbing  NEURO: alert & oriented x 3 with fluent speech, no focal motor/sensory deficits  LABORATORY DATA:  I have reviewed the data as listed    Component Value Date/Time   NA 138 02/19/2016 1320   NA 141 06/29/2014 0655   K 3.9 02/19/2016 1320   K 4.4 06/29/2014 0655   CL 104 06/29/2014 0655   CO2 25 02/19/2016 1320   CO2 28 06/29/2014 0655   GLUCOSE 88 02/19/2016 1320   GLUCOSE 95 06/29/2014 0655   BUN 13.7 02/19/2016 1320   BUN 19 06/29/2014 0655   CREATININE 0.9 02/19/2016 1320   CREATININE 0.97 06/29/2014 0655   CALCIUM 9.3 02/19/2016 1320   CALCIUM 9.4 06/29/2014 0655   PROT 6.5 02/19/2016 1320   PROT 7.1 06/29/2014 0655   ALBUMIN 3.8 02/19/2016 1320   ALBUMIN 4.1 06/29/2014 0655   AST 22 02/19/2016 1320   AST 29 06/29/2014 0655   ALT 23 02/19/2016 1320   ALT 24 06/29/2014 0655  ALKPHOS 56 02/19/2016 1320   ALKPHOS 47 06/29/2014 0655   BILITOT 0.39 02/19/2016 1320   BILITOT 0.3 06/29/2014 0655   GFRNONAA >90 06/29/2014 0655   GFRAA >90 06/29/2014 0655    No results found for: SPEP, UPEP  Lab Results  Component Value Date   WBC 5.5 03/04/2016   NEUTROABS 3.4 03/04/2016   HGB 14.8 03/04/2016   HCT 42.9 03/04/2016   MCV 87.7 03/04/2016   PLT 210 03/04/2016      Chemistry      Component Value Date/Time   NA 138 02/19/2016 1320   NA 141 06/29/2014 0655   K 3.9 02/19/2016 1320   K 4.4 06/29/2014 0655   CL 104 06/29/2014 0655   CO2 25 02/19/2016 1320   CO2 28 06/29/2014 0655   BUN 13.7 02/19/2016 1320   BUN 19  06/29/2014 0655   CREATININE 0.9 02/19/2016 1320   CREATININE 0.97 06/29/2014 0655      Component Value Date/Time   CALCIUM 9.3 02/19/2016 1320   CALCIUM 9.4 06/29/2014 0655   ALKPHOS 56 02/19/2016 1320   ALKPHOS 47 06/29/2014 0655   AST 22 02/19/2016 1320   AST 29 06/29/2014 0655   ALT 23 02/19/2016 1320   ALT 24 06/29/2014 0655   BILITOT 0.39 02/19/2016 1320   BILITOT 0.3 06/29/2014 0655       RADIOGRAPHIC STUDIES:-I have also reviewed his CT scan of the abdomen I have personally reviewed the radiological images as listed and agreed with the findings in the report. Ct Soft Tissue Neck W Contrast  03/03/2016  CLINICAL DATA:  30 year old male with nasopharyngeal head and neck primary diagnosed in 2007 or 2008, subsequent spread to the neck and lungs. Chemotherapy every other week. Reason abdominal pain. Subsequent encounter. EXAM: CT NECK WITH CONTRAST TECHNIQUE: Multidetector CT imaging of the neck was performed using the standard protocol following the bolus administration of intravenous contrast. CONTRAST:  15m ISOVUE-300 IOPAMIDOL (ISOVUE-300) INJECTION 61% COMPARISON:  PET-CT 10/29/2015.  Neck CT 06/19/2015 FINDINGS: Pharynx and larynx: The nasopharynx appears stable and within normal limits. Negative parapharyngeal spaces. Elsewhere the pharynx is capacious. Other pharyngeal and laryngeal soft tissue contours are within normal limits. Negative retropharyngeal space. Salivary glands: Sublingual space obscured by dental streak artifact. Diminutive left submandibular gland appears stable and within normal limits. The right submandibular gland appears to be atrophied or absent as before. Diminutive left parotid gland is stable. Along the posterior margin of the inferior right parotid space mass like soft tissue inseparable from the right sternocleidomastoid muscle and tracking to the skin surface is re- demonstrated with patchy abnormal enhancement (series 3 images 18 through 24). The  associated abnormal enhancement today is more discontinuous then in September 2016. This corresponds to the hypermetabolic lesion posterior and inferior to the right ear seen on series 606, image 19 of the January 2017 PET-CT. The overall size and configuration by CT has not significantly changed since 2016 (see also sagittal image 22). Thyroid: Diminutive, negative. Lymph nodes: Abnormal left level IIIb versus level 4 lymph node seen by PET in January corresponds to the 5 mm short axis node on series 3, image 56 today, and is smaller by CT than in 2016 (6-7 mm and more rounded at that time). Nearby small left level 4/thoracic inlet nodes are stable since 2016. A 5 mm left level 2 B node on series 3, image 35 is stable and was not hypermetabolic in January. A midline left level 1A node appears stable. No  new or enlarged lymph nodes. Vascular: The right IJ is surgically absent. Remaining major vascular structures in the neck and at the skullbase are patent. Limited intracranial: Negative. Visualized orbits: Negative. Mastoids and visualized paranasal sinuses: Stable left greater than right maxillary sinus mucosal thickening. Skeleton: No acute or suspicious osseous lesion identified. Upper chest: Stable visible post radiation changes in the visible lung apices. No superior mediastinal or axillary lymphadenopathy. IMPRESSION: 1. Right sternocleidomastoid muscle metastasis appears less distinct but otherwise not significantly changed in size or configuration since 06/19/2015. 2. Left level 3 lymph node which was hypermetabolic by PET-CT in January 2017 appears slightly smaller by CT since 2016. 3. Otherwise stable post treatment CT appearance of the neck. Since September 2016 Electronically Signed   By: Genevie Ann M.D.   On: 03/03/2016 14:20     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[place]) He had excellent response to treatment. CT scan show excellent response to treatment and last PET CT scan show he  has achieved complete response. We will continue Nivolumab every 2 weeks for another 3 months before repeating imaging study, next due end of August 2017     Acquired hypothyroidism He has acquired hypothyroidism from prior radiation and surgery to his neck. He is compliant taking his thyroid medicine. I reinforced the importance of taking his thyroid replacement therapy regularly. TSH today showed adequate replacement  Epigastric pain He was evaluated at the GI service. CT scan show abnormal thickening in his stomach of unknown etiology. He felt that the proton pump inhibitor is not helping. Per GI recommendation, I think EGD is appropriate. He would be highly unusual for metastatic deposit from his head and neck cancer as recent PET CT scan showed he has achieved complete response in his head and neck region.   No orders of the defined types were placed in this encounter.   All questions were answered. The patient knows to call the clinic with any problems, questions or concerns. No barriers to learning was detected. I spent 20 minutes counseling the patient face to face. The total time spent in the appointment was 25 minutes and more than 50% was on counseling and review of test results     [facility], [place], MD 03/04/2016 11:24 AM

## 2016-03-04 NOTE — Progress Notes (Signed)
Difficult IV stick,  6 sticks by 3 RNs.  Discussed PAC w/ pt at length.  He reports has a lot of anxiety about having something implanted in his body.  He is worried it will cause him some panic attacks.  He would llike to think about it.  He will try to drink a lot of water prior to next treatment to see if this helps.

## 2016-03-04 NOTE — Patient Instructions (Signed)
[facility] Discharge Instructions for Patients Receiving Chemotherapy  Today you received the following immunotherapy agents; Opdivo.   To help prevent nausea and vomiting, take your nausea medication as directed.    If you develop nausea and vomiting that is not controlled by your nausea medication, call the clinic.   BELOW ARE SYMPTOMS THAT SHOULD BE REPORTED IMMEDIATELY:  *FEVER GREATER THAN 100.5 F  *CHILLS WITH OR WITHOUT FEVER  NAUSEA AND VOMITING THAT IS NOT CONTROLLED WITH YOUR NAUSEA MEDICATION  *UNUSUAL SHORTNESS OF BREATH  *UNUSUAL BRUISING OR BLEEDING  TENDERNESS IN MOUTH AND THROAT WITH OR WITHOUT PRESENCE OF ULCERS  *URINARY PROBLEMS  *BOWEL PROBLEMS  UNUSUAL RASH Items with * indicate a potential emergency and should be followed up as soon as possible.  Feel free to call the clinic you have any questions or concerns. The clinic phone number is [phone].  Please show the [facility] at check-in to the Emergency Department and triage nurse.

## 2016-03-04 NOTE — Assessment & Plan Note (Signed)
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 He is compliant taking his thyroid medicine. I reinforced the importance of taking his thyroid replacement therapy regularly. TSH today showed adequate replacement

## 2016-03-04 NOTE — Telephone Encounter (Signed)
per pof to sch pt appt-gave pt copy of avs °

## 2016-03-04 NOTE — Assessment & Plan Note (Signed)
He was evaluated at the GI service. CT scan show abnormal thickening in his stomach of unknown etiology. He felt that the proton pump inhibitor is not helping. Per GI recommendation, I think EGD is appropriate. He would be highly unusual for metastatic deposit from his head and neck cancer as recent PET CT scan showed he has achieved complete response in his head and neck region.

## 2016-03-04 NOTE — Assessment & Plan Note (Signed)
He had excellent response to treatment. CT scan show excellent response to treatment and last PET CT scan show he has achieved complete response. We will continue Nivolumab every 2 weeks for another 3 months before repeating imaging study, next due end of August 2017

## 2016-03-04 NOTE — Telephone Encounter (Signed)
Per staff message and POF I have scheduled appts. Advised scheduler of appts. JMW  

## 2016-03-05 ENCOUNTER — Other Ambulatory Visit: Payer: Self-pay

## 2016-03-05 DIAGNOSIS — R935 Abnormal findings on diagnostic imaging of other abdominal regions, including retroperitoneum: Secondary | ICD-10-CM

## 2016-03-18 ENCOUNTER — Ambulatory Visit (HOSPITAL_BASED_OUTPATIENT_CLINIC_OR_DEPARTMENT_OTHER): Payer: Medicaid Other

## 2016-03-18 ENCOUNTER — Other Ambulatory Visit (HOSPITAL_BASED_OUTPATIENT_CLINIC_OR_DEPARTMENT_OTHER): Payer: Medicaid Other

## 2016-03-18 ENCOUNTER — Ambulatory Visit: Payer: Medicaid Other | Admitting: Nutrition

## 2016-03-18 VITALS — BP 122/74 | HR 86 | Temp 98.4°F | Resp 18

## 2016-03-18 DIAGNOSIS — C119 Malignant neoplasm of nasopharynx, unspecified: Secondary | ICD-10-CM

## 2016-03-18 DIAGNOSIS — C760 Malignant neoplasm of head, face and neck: Secondary | ICD-10-CM

## 2016-03-18 DIAGNOSIS — C7801 Secondary malignant neoplasm of right lung: Secondary | ICD-10-CM | POA: Diagnosis not present

## 2016-03-18 DIAGNOSIS — Z5112 Encounter for antineoplastic immunotherapy: Secondary | ICD-10-CM

## 2016-03-18 DIAGNOSIS — C7800 Secondary malignant neoplasm of unspecified lung: Secondary | ICD-10-CM

## 2016-03-18 LAB — COMPREHENSIVE METABOLIC PANEL
ALBUMIN: 3.9 g/dL (ref 3.5–5.0)
ALK PHOS: 56 U/L (ref 40–150)
ALT: 15 U/L (ref 0–55)
ANION GAP: 11 meq/L (ref 3–11)
AST: 20 U/L (ref 5–34)
BUN: 15.1 mg/dL (ref 7.0–26.0)
CALCIUM: 9.2 mg/dL (ref 8.4–10.4)
CHLORIDE: 106 meq/L (ref 98–109)
CO2: 22 mEq/L (ref 22–29)
Creatinine: 1.1 mg/dL (ref 0.7–1.3)
Glucose: 167 mg/dl — ABNORMAL HIGH (ref 70–140)
POTASSIUM: 3.8 meq/L (ref 3.5–5.1)
Sodium: 139 mEq/L (ref 136–145)
Total Bilirubin: 0.49 mg/dL (ref 0.20–1.20)
Total Protein: 6.6 g/dL (ref 6.4–8.3)

## 2016-03-18 LAB — CBC WITH DIFFERENTIAL/PLATELET
BASO%: 1 % (ref 0.0–2.0)
Basophils Absolute: 0 10*3/uL (ref 0.0–0.1)
EOS ABS: 0.1 10*3/uL (ref 0.0–0.5)
EOS%: 1.2 % (ref 0.0–7.0)
HEMATOCRIT: 42.8 % (ref 38.4–49.9)
HGB: 14.7 g/dL (ref 13.0–17.1)
LYMPH%: 28.4 % (ref 14.0–49.0)
MCH: 30.4 pg (ref 27.2–33.4)
MCHC: 34.3 g/dL (ref 32.0–36.0)
MCV: 88.6 fL (ref 79.3–98.0)
MONO#: 0.3 10*3/uL (ref 0.1–0.9)
MONO%: 7.7 % (ref 0.0–14.0)
NEUT%: 61.7 % (ref 39.0–75.0)
NEUTROS ABS: 2.5 10*3/uL (ref 1.5–6.5)
NRBC: 0 % (ref 0–0)
PLATELETS: 170 10*3/uL (ref 140–400)
RBC: 4.83 10*6/uL (ref 4.20–5.82)
RDW: 13.8 % (ref 11.0–14.6)
WBC: 4 10*3/uL (ref 4.0–10.3)
lymph#: 1.1 10*3/uL (ref 0.9–3.3)

## 2016-03-18 MED ORDER — SODIUM CHLORIDE 0.9 % IV SOLN
Freq: Once | INTRAVENOUS | Status: AC
  Administered 2016-03-18: 12:00:00 via INTRAVENOUS

## 2016-03-18 MED ORDER — NIVOLUMAB CHEMO INJECTION 100 MG/10ML
240.0000 mg | Freq: Once | INTRAVENOUS | Status: AC
  Administered 2016-03-18: 240 mg via INTRAVENOUS
  Filled 2016-03-18: qty 20

## 2016-03-18 NOTE — Progress Notes (Signed)
Patient was identified be at risk for malnutrition on the MST secondary to poor appetite and weight loss.  Patient is a 30 year old male diagnosed with nasopharyngeal cancer receiving chemotherapy.  Past medical history includes neuropathy, fatigue, and hypothyroidism.  Medications include Synthroid, Prilosec, Zofran.  Labs were reviewed.  Height: 67 inches. Weight: 166.9 pounds. Usual body weight: 187 pounds on 10/15/1998 BMI: 25.59  Patient reports he is currently fasting 16 hours daily secondary to Ramadan. Patient states he has nausea and vomiting after eating many foods. He does not like taking his nausea medication. He has tried oral nutrition supplements but states they are too sweet. Dietary recall reveals patient does consume meat, chicken, nuts and milk. He has food aversions to foods he used to enjoy secondary to vomiting.  Nutrition diagnosis:  Unintended weight loss related to inadequate oral intake as evidenced by 11% weight loss over 6 months.  Intervention: Patient was educated on high-calorie, high-protein foods as tolerated in small frequent snacks during allowed times to eat. Educated patient on ways to improve tolerance of ensure. Provided samples. Reviewed high protein foods with patient and provided a fact sheet on increasing calories and protein. Questions were answered.  Teach back method used.  Contact information was provided.  Monitoring, evaluation, goals: Patient will tolerate increased calories and protein to promote weight stabilization.  Next visit: Patient will contact me for questions or concerns.  **Disclaimer: This note was dictated with voice recognition software. Similar sounding words can inadvertently be transcribed and this note may contain transcription errors which may not have been corrected upon publication of note.**

## 2016-03-18 NOTE — Patient Instructions (Signed)
[facility] Discharge Instructions for Patients Receiving Chemotherapy  Today you received the following chemotherapy agents:  Nivolumab.  To help prevent nausea and vomiting after your treatment, we encourage you to take your nausea medication as directed.   If you develop nausea and vomiting that is not controlled by your nausea medication, call the clinic.   BELOW ARE SYMPTOMS THAT SHOULD BE REPORTED IMMEDIATELY:  *FEVER GREATER THAN 100.5 F  *CHILLS WITH OR WITHOUT FEVER  NAUSEA AND VOMITING THAT IS NOT CONTROLLED WITH YOUR NAUSEA MEDICATION  *UNUSUAL SHORTNESS OF BREATH  *UNUSUAL BRUISING OR BLEEDING  TENDERNESS IN MOUTH AND THROAT WITH OR WITHOUT PRESENCE OF ULCERS  *URINARY PROBLEMS  *BOWEL PROBLEMS  UNUSUAL RASH Items with * indicate a potential emergency and should be followed up as soon as possible.  Feel free to call the clinic you have any questions or concerns. The clinic phone number is [phone].  Please show the CHEMO ALERT CARD at check-in to the Emergency Department and triage nurse.   

## 2016-03-19 ENCOUNTER — Telehealth: Payer: Self-pay | Admitting: *Deleted

## 2016-03-19 NOTE — Telephone Encounter (Signed)
Patient requesting an order for a neck massage, per dr Alvy Bimler, she would like to discuss with patient face to face at next scheduled visit, patient verbalized understanding.

## 2016-03-19 NOTE — Telephone Encounter (Signed)
-----   Message from [name], MD sent at 03/19/2016  8:10 AM EDT ----- Regarding: RE: neck pain The patient does not need any specific massages He used to wear a neck brace; that might help with some heat pads. He probably strain his neck recently Last CT scan looks good. He can take short course ibuprofen 400 mg BID with food or go back to his previous pain medications ----- Message -----    From: Cathlean Cower, RN    Sent: 03/18/2016   5:33 PM      To: Patton Salles, RN, [name], MD Subject: neck pain                                      Dr. Alvy Bimler, Pt stopped me in infusion this afternoon.  He is asking if there is anything else, any other therapies that can be done for his right neck muscle which is very tight and he says more painful.  He saw a massage therapist who recommended "neuro-muscular massage" but pt cannot afford this.  He says the exercises recommended by Physical Therapy don't help. I told him I would look into it but I ran out of time. Not sure what therapy would help.  Sorry.  [place]

## 2016-03-19 NOTE — Telephone Encounter (Signed)
Called patient, no answer and no voicemail.

## 2016-03-30 ENCOUNTER — Encounter (HOSPITAL_COMMUNITY): Payer: Self-pay | Admitting: *Deleted

## 2016-03-31 ENCOUNTER — Emergency Department (HOSPITAL_COMMUNITY)
Admission: EM | Admit: 2016-03-31 | Discharge: 2016-04-01 | Disposition: A | Discharge status: Home / Self Care | Payer: No Typology Code available for payment source | Attending: Emergency Medicine | Admitting: Emergency Medicine

## 2016-03-31 DIAGNOSIS — M542 Cervicalgia: Secondary | ICD-10-CM | POA: Diagnosis present

## 2016-03-31 DIAGNOSIS — E039 Hypothyroidism, unspecified: Secondary | ICD-10-CM | POA: Insufficient documentation

## 2016-03-31 DIAGNOSIS — S134XXA Sprain of ligaments of cervical spine, initial encounter: Secondary | ICD-10-CM | POA: Diagnosis not present

## 2016-03-31 DIAGNOSIS — Y92410 Unspecified street and highway as the place of occurrence of the external cause: Secondary | ICD-10-CM | POA: Diagnosis not present

## 2016-03-31 DIAGNOSIS — Y9389 Activity, other specified: Secondary | ICD-10-CM | POA: Insufficient documentation

## 2016-03-31 DIAGNOSIS — Y999 Unspecified external cause status: Secondary | ICD-10-CM | POA: Insufficient documentation

## 2016-03-31 MED ORDER — OXYCODONE HCL 5 MG PO TABS
5.0000 mg | ORAL_TABLET | Freq: Once | ORAL | Status: AC
  Administered 2016-03-31: 5 mg via ORAL
  Filled 2016-03-31: qty 1
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
## 2016-03-31 NOTE — ED Notes (Signed)
Provider at bedside
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
## 2016-04-01 ENCOUNTER — Other Ambulatory Visit (HOSPITAL_BASED_OUTPATIENT_CLINIC_OR_DEPARTMENT_OTHER): Payer: Medicaid Other

## 2016-04-01 ENCOUNTER — Telehealth: Payer: Self-pay | Admitting: Hematology and Oncology

## 2016-04-01 ENCOUNTER — Emergency Department (HOSPITAL_COMMUNITY): Payer: No Typology Code available for payment source

## 2016-04-01 ENCOUNTER — Encounter: Payer: Self-pay | Admitting: Hematology and Oncology

## 2016-04-01 ENCOUNTER — Ambulatory Visit (HOSPITAL_BASED_OUTPATIENT_CLINIC_OR_DEPARTMENT_OTHER): Payer: Medicaid Other | Admitting: Hematology and Oncology

## 2016-04-01 ENCOUNTER — Ambulatory Visit (HOSPITAL_BASED_OUTPATIENT_CLINIC_OR_DEPARTMENT_OTHER): Payer: Medicaid Other

## 2016-04-01 ENCOUNTER — Other Ambulatory Visit: Payer: Self-pay | Admitting: *Deleted

## 2016-04-01 VITALS — BP 101/62 | HR 80 | Temp 97.8°F | Resp 18 | Ht 67.72 in | Wt 161.4 lb

## 2016-04-01 DIAGNOSIS — I878 Other specified disorders of veins: Secondary | ICD-10-CM | POA: Diagnosis not present

## 2016-04-01 DIAGNOSIS — C760 Malignant neoplasm of head, face and neck: Secondary | ICD-10-CM

## 2016-04-01 DIAGNOSIS — R1013 Epigastric pain: Secondary | ICD-10-CM

## 2016-04-01 DIAGNOSIS — E039 Hypothyroidism, unspecified: Secondary | ICD-10-CM

## 2016-04-01 DIAGNOSIS — C7801 Secondary malignant neoplasm of right lung: Secondary | ICD-10-CM

## 2016-04-01 DIAGNOSIS — Z5112 Encounter for antineoplastic immunotherapy: Secondary | ICD-10-CM | POA: Diagnosis present

## 2016-04-01 DIAGNOSIS — M542 Cervicalgia: Secondary | ICD-10-CM

## 2016-04-01 DIAGNOSIS — C7800 Secondary malignant neoplasm of unspecified lung: Secondary | ICD-10-CM

## 2016-04-01 DIAGNOSIS — Z95828 Presence of other vascular implants and grafts: Secondary | ICD-10-CM | POA: Insufficient documentation

## 2016-04-01 DIAGNOSIS — Z79899 Other long term (current) drug therapy: Secondary | ICD-10-CM | POA: Diagnosis not present

## 2016-04-01 DIAGNOSIS — S134XXA Sprain of ligaments of cervical spine, initial encounter: Secondary | ICD-10-CM | POA: Diagnosis not present

## 2016-04-01 DIAGNOSIS — C119 Malignant neoplasm of nasopharynx, unspecified: Secondary | ICD-10-CM

## 2016-04-01 LAB — CBC WITH DIFFERENTIAL/PLATELET
BASO%: 0.3 % (ref 0.0–2.0)
BASOS ABS: 0 10*3/uL (ref 0.0–0.1)
EOS ABS: 0.1 10*3/uL (ref 0.0–0.5)
EOS%: 3 % (ref 0.0–7.0)
HEMATOCRIT: 45.5 % (ref 38.4–49.9)
HGB: 15.6 g/dL (ref 13.0–17.1)
LYMPH#: 1 10*3/uL (ref 0.9–3.3)
LYMPH%: 25.6 % (ref 14.0–49.0)
MCH: 30.2 pg (ref 27.2–33.4)
MCHC: 34.3 g/dL (ref 32.0–36.0)
MCV: 88.2 fL (ref 79.3–98.0)
MONO#: 0.3 10*3/uL (ref 0.1–0.9)
MONO%: 8 % (ref 0.0–14.0)
NEUT#: 2.5 10*3/uL (ref 1.5–6.5)
NEUT%: 63.1 % (ref 39.0–75.0)
PLATELETS: 206 10*3/uL (ref 140–400)
RBC: 5.16 10*6/uL (ref 4.20–5.82)
RDW: 13.4 % (ref 11.0–14.6)
WBC: 4 10*3/uL (ref 4.0–10.3)

## 2016-04-01 LAB — COMPREHENSIVE METABOLIC PANEL
ALBUMIN: 4 g/dL (ref 3.5–5.0)
ALK PHOS: 67 U/L (ref 40–150)
ALT: 21 U/L (ref 0–55)
ANION GAP: 11 meq/L (ref 3–11)
AST: 23 U/L (ref 5–34)
BILIRUBIN TOTAL: 0.3 mg/dL (ref 0.20–1.20)
BUN: 12.1 mg/dL (ref 7.0–26.0)
CO2: 25 mEq/L (ref 22–29)
Calcium: 9.4 mg/dL (ref 8.4–10.4)
Chloride: 105 mEq/L (ref 98–109)
Creatinine: 0.9 mg/dL (ref 0.7–1.3)
Glucose: 125 mg/dl (ref 70–140)
POTASSIUM: 3.7 meq/L (ref 3.5–5.1)
Sodium: 141 mEq/L (ref 136–145)
TOTAL PROTEIN: 7 g/dL (ref 6.4–8.3)

## 2016-04-01 LAB — TSH: TSH: 5.139 m(IU)/L — ABNORMAL HIGH (ref 0.320–4.118)

## 2016-04-01 MED ORDER — SODIUM CHLORIDE 0.9 % IV SOLN
240.0000 mg | Freq: Once | INTRAVENOUS | Status: AC
  Administered 2016-04-01: 240 mg via INTRAVENOUS
  Filled 2016-04-01: qty 8

## 2016-04-01 MED ORDER — SODIUM CHLORIDE 0.9 % IV SOLN
Freq: Once | INTRAVENOUS | Status: AC
  Administered 2016-04-01: 12:00:00 via INTRAVENOUS

## 2016-04-01 NOTE — Patient Instructions (Signed)
Nivolumab injection What is this medicine? NIVOLUMAB (nye VOL ue mab) is a monoclonal antibody. It is used to treat melanoma, lung cancer, kidney cancer, and Hodgkin lymphoma. This medicine may be used for other purposes; ask your health care provider or pharmacist if you have questions. What should I tell my health care provider before I take this medicine? They need to know if you have any of these conditions: -diabetes -immune system problems -kidney disease -liver disease -lung disease -organ transplant -stomach or intestine problems -thyroid disease -an unusual or allergic reaction to nivolumab, other medicines, foods, dyes, or preservatives -pregnant or trying to get pregnant -breast-feeding How should I use this medicine? This medicine is for infusion into a vein. It is given by a health care professional in a hospital or clinic setting. A special MedGuide will be given to you before each treatment. Be sure to read this information carefully each time. Talk to your pediatrician regarding the use of this medicine in children. Special care may be needed. Overdosage: If you think you have taken too much of this medicine contact a poison control center or emergency room at once. NOTE: This medicine is only for you. Do not share this medicine with others. What if I miss a dose? It is important not to miss your dose. Call your doctor or health care professional if you are unable to keep an appointment. What may interact with this medicine? Interactions have not been studied. Give your health care provider a list of all the medicines, herbs, non-prescription drugs, or dietary supplements you use. Also tell them if you smoke, drink alcohol, or use illegal drugs. Some items may interact with your medicine. This list may not describe all possible interactions. Give your health care provider a list of all the medicines, herbs, non-prescription drugs, or dietary supplements you use. Also tell  them if you smoke, drink alcohol, or use illegal drugs. Some items may interact with your medicine. What should I watch for while using this medicine? This drug may make you feel generally unwell. Continue your course of treatment even though you feel ill unless your doctor tells you to stop. You may need blood work done while you are taking this medicine. Do not become pregnant while taking this medicine or for 5 months after stopping it. Women should inform their doctor if they wish to become pregnant or think they might be pregnant. There is a potential for serious side effects to an unborn child. Talk to your health care professional or pharmacist for more information. Do not breast-feed an infant while taking this medicine. What side effects may I notice from receiving this medicine? Side effects that you should report to your doctor or health care professional as soon as possible: -allergic reactions like skin rash, itching or hives, swelling of the face, lips, or tongue -black, tarry stools -blood in the urine -bloody or watery diarrhea -changes in vision -change in sex drive -changes in emotions or moods -chest pain -confusion -cough -decreased appetite -diarrhea -facial flushing -feeling faint or lightheaded -fever, chills -hair loss -hallucination, loss of contact with reality -headache -irritable -joint pain -loss of memory -muscle pain -muscle weakness -seizures -shortness of breath -signs and symptoms of high blood sugar such as dizziness; dry mouth; dry skin; fruity breath; nausea; stomach pain; increased hunger or thirst; increased urination -signs and symptoms of kidney injury like trouble passing urine or change in the amount of urine -signs and symptoms of liver injury like dark yellow or  brown urine; general ill feeling or flu-like symptoms; light-colored stools; loss of appetite; nausea; right upper belly pain; unusually weak or tired; yellowing of the eyes or  skin -stiff neck -swelling of the ankles, feet, hands -weight gain Side effects that usually do not require medical attention (report to your doctor or health care professional if they continue or are bothersome): -bone pain -constipation -tiredness -vomiting This list may not describe all possible side effects. Call your doctor for medical advice about side effects. You may report side effects to FDA at 1-800-FDA-1088. Where should I keep my medicine? This drug is given in a hospital or clinic and will not be stored at home. NOTE: This sheet is a summary. It may not cover all possible information. If you have questions about this medicine, talk to your doctor, pharmacist, or health care provider.    2016, [employer]/Gold Standard. (2015-02-20 10:03:42)

## 2016-04-01 NOTE — Assessment & Plan Note (Signed)
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 I recommend continue conservative management with physical therapy, topical lidocaine and pain medicine as needed 

## 2016-04-01 NOTE — Telephone Encounter (Signed)
Pt will get updated sched in tx room °
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
## 2016-04-01 NOTE — Assessment & Plan Note (Signed)
He was evaluated at the GI service. CT scan show abnormal thickening in his stomach of unknown etiology. He felt that the proton pump inhibitor is not helping. Per GI recommendation, I think EGD is appropriate.

## 2016-04-01 NOTE — Assessment & Plan Note (Signed)
He has poor venous access. We discussed port placement but he declined.

## 2016-04-01 NOTE — Assessment & Plan Note (Signed)
He had excellent response to treatment. CT scan show excellent response to treatment and last PET CT scan show he has achieved complete response. We will continue Nivolumab every 2 weeks for another 3 months before repeating imaging study, next due end of August 2017

## 2016-04-01 NOTE — Discharge Instructions (Signed)
There was no evidence of a fracture on your exam. Continue NSAIDs such as Ibuprofen '400mg'$  every 8 hours a your oncologist previously instructed. You can take Flexeril three times a day as needed for neck pain as well- it can make you drowsy.   Cervical Sprain A cervical sprain is an injury in the neck in which the strong, fibrous tissues (ligaments) that connect your neck bones stretch or tear. Cervical sprains can range from mild to severe. Severe cervical sprains can cause the neck vertebrae to be unstable. This can lead to damage of the spinal cord and can result in serious nervous system problems. The amount of time it takes for a cervical sprain to get better depends on the cause and extent of the injury. Most cervical sprains heal in 1 to 3 weeks. CAUSES  Severe cervical sprains may be caused by:   Contact sport injuries (such as from football, rugby, wrestling, hockey, auto racing, gymnastics, diving, martial arts, or boxing).   Motor vehicle collisions.   Whiplash injuries. This is an injury from a sudden forward and backward whipping movement of the head and neck.  Falls.  Mild cervical sprains may be caused by:   Being in an awkward position, such as while cradling a telephone between your ear and shoulder.   Sitting in a chair that does not offer proper support.   Working at a poorly Landscape [employer] station.   Looking up or down for long periods of time.  SYMPTOMS   Pain, soreness, stiffness, or a burning sensation in the front, back, or sides of the neck. This discomfort may develop immediately after the injury or slowly, 24 hours or more after the injury.   Pain or tenderness directly in the middle of the back of the neck.   Shoulder or upper back pain.   Limited ability to move the neck.   Headache.   Dizziness.   Weakness, numbness, or tingling in the hands or arms.   Muscle spasms.   Difficulty swallowing or chewing.   Tenderness and  swelling of the neck.  DIAGNOSIS  Most of the time your health care provider can diagnose a cervical sprain by taking your history and doing a physical exam. Your health care provider will ask about previous neck injuries and any known neck problems, such as arthritis in the neck. X-rays may be taken to find out if there are any other problems, such as with the bones of the neck. Other tests, such as a CT scan or MRI, may also be needed.  TREATMENT  Treatment depends on the severity of the cervical sprain. Mild sprains can be treated with rest, keeping the neck in place (immobilization), and pain medicines. Severe cervical sprains are immediately immobilized. Further treatment is done to help with pain, muscle spasms, and other symptoms and may include:  Medicines, such as pain relievers, numbing medicines, or muscle relaxants.   Physical therapy. This may involve stretching exercises, strengthening exercises, and posture training. Exercises and improved posture can help stabilize the neck, strengthen muscles, and help stop symptoms from returning.  HOME CARE INSTRUCTIONS   Put ice on the injured area.   Put ice in a plastic bag.   Place a towel between your skin and the bag.   Leave the ice on for 15-20 minutes, 3-4 times a day.   If your injury was severe, you may have been given a cervical collar to wear. A cervical collar is a two-piece collar designed to keep  your neck from moving while it heals.  Do not remove the collar unless instructed by your health care provider.  If you have long hair, keep it outside of the collar.  Ask your health care provider before making any adjustments to your collar. Minor adjustments may be required over time to improve comfort and reduce pressure on your chin or on the back of your head.  Ifyou are allowed to remove the collar for cleaning or bathing, follow your health care provider's instructions on how to do so safely.  Keep your collar  clean by wiping it with mild soap and water and drying it completely. If the collar you have been given includes removable pads, remove them every 1-2 days and hand wash them with soap and water. Allow them to air dry. They should be completely dry before you wear them in the collar.  If you are allowed to remove the collar for cleaning and bathing, wash and dry the skin of your neck. Check your skin for irritation or sores. If you see any, tell your health care provider.  Do not drive while wearing the collar.   Only take over-the-counter or prescription medicines for pain, discomfort, or fever as directed by your health care provider.   Keep all follow-up appointments as directed by your health care provider.   Keep all physical therapy appointments as directed by your health care provider.   Make any needed adjustments to your workstation to promote good posture.   Avoid positions and activities that make your symptoms worse.   Warm up and stretch before being active to help prevent problems.  SEEK MEDICAL CARE IF:   Your pain is not controlled with medicine.   You are unable to decrease your pain medicine over time as planned.   Your activity level is not improving as expected.  SEEK IMMEDIATE MEDICAL CARE IF:   You develop any bleeding.  You develop stomach upset.  You have signs of an allergic reaction to your medicine.   Your symptoms get worse.   You develop new, unexplained symptoms.   You have numbness, tingling, weakness, or paralysis in any part of your body.  MAKE SURE YOU:   Understand these instructions.  Will watch your condition.  Will get help right away if you are not doing well or get worse.   This information is not intended to replace advice given to you by your health care provider. Make sure you discuss any questions you have with your health care provider.   Document Released: 07/19/2007 Document Revised: 09/26/2013 Document  Reviewed: 03/29/2013 Elsevier Interactive Patient Education [employer].

## 2016-04-08 ENCOUNTER — Telehealth: Payer: Self-pay | Admitting: *Deleted

## 2016-04-08 ENCOUNTER — Telehealth: Payer: Self-pay | Admitting: Internal Medicine

## 2016-04-08 NOTE — Telephone Encounter (Signed)
Pt called to cancel his EGD at the hospital. Left message for pt to call back to reschedule his procedure.

## 2016-04-08 NOTE — Telephone Encounter (Signed)
TC from patient stating he has cancelled endoscopy that was scheduled for tomorrow with Dr. Henrene Pastor. He is not feeling well (fever, diarrhea) and is seeing his PCP later today.  He wanted Dr. Alvy Bimler to know of cancellation.

## 2016-04-09 ENCOUNTER — Ambulatory Visit (HOSPITAL_COMMUNITY): Admission: RE | Admit: 2016-04-09 | Payer: Medicaid Other | Source: Ambulatory Visit | Admitting: Internal Medicine

## 2016-04-09 SURGERY — EGD (ESOPHAGOGASTRODUODENOSCOPY)
Anesthesia: Monitor Anesthesia Care

## 2016-04-15 ENCOUNTER — Ambulatory Visit: Payer: Medicaid Other

## 2016-04-15 ENCOUNTER — Ambulatory Visit (HOSPITAL_BASED_OUTPATIENT_CLINIC_OR_DEPARTMENT_OTHER): Payer: Medicaid Other | Admitting: Hematology and Oncology

## 2016-04-15 ENCOUNTER — Other Ambulatory Visit (HOSPITAL_BASED_OUTPATIENT_CLINIC_OR_DEPARTMENT_OTHER): Payer: Medicaid Other

## 2016-04-15 ENCOUNTER — Other Ambulatory Visit: Payer: Self-pay | Admitting: Hematology and Oncology

## 2016-04-15 ENCOUNTER — Encounter: Payer: Self-pay | Admitting: Hematology and Oncology

## 2016-04-15 VITALS — BP 109/58 | HR 84 | Temp 98.1°F | Resp 18 | Wt 161.2 lb

## 2016-04-15 DIAGNOSIS — M542 Cervicalgia: Secondary | ICD-10-CM | POA: Diagnosis not present

## 2016-04-15 DIAGNOSIS — H109 Unspecified conjunctivitis: Secondary | ICD-10-CM | POA: Diagnosis not present

## 2016-04-15 DIAGNOSIS — I878 Other specified disorders of veins: Secondary | ICD-10-CM

## 2016-04-15 DIAGNOSIS — C760 Malignant neoplasm of head, face and neck: Secondary | ICD-10-CM

## 2016-04-15 DIAGNOSIS — C7801 Secondary malignant neoplasm of right lung: Secondary | ICD-10-CM | POA: Diagnosis not present

## 2016-04-15 DIAGNOSIS — C7800 Secondary malignant neoplasm of unspecified lung: Secondary | ICD-10-CM

## 2016-04-15 DIAGNOSIS — R1013 Epigastric pain: Secondary | ICD-10-CM | POA: Diagnosis not present

## 2016-04-15 DIAGNOSIS — C119 Malignant neoplasm of nasopharynx, unspecified: Secondary | ICD-10-CM

## 2016-04-15 LAB — COMPREHENSIVE METABOLIC PANEL
ALBUMIN: 3.8 g/dL (ref 3.5–5.0)
ALK PHOS: 63 U/L (ref 40–150)
ALT: 15 U/L (ref 0–55)
ANION GAP: 9 meq/L (ref 3–11)
AST: 18 U/L (ref 5–34)
BUN: 10.6 mg/dL (ref 7.0–26.0)
CO2: 25 mEq/L (ref 22–29)
Calcium: 9.4 mg/dL (ref 8.4–10.4)
Chloride: 108 mEq/L (ref 98–109)
Creatinine: 1 mg/dL (ref 0.7–1.3)
Glucose: 115 mg/dl (ref 70–140)
Potassium: 3.8 mEq/L (ref 3.5–5.1)
Sodium: 141 mEq/L (ref 136–145)
TOTAL PROTEIN: 6.6 g/dL (ref 6.4–8.3)

## 2016-04-15 LAB — CBC WITH DIFFERENTIAL/PLATELET
BASO%: 0.9 % (ref 0.0–2.0)
Basophils Absolute: 0 10*3/uL (ref 0.0–0.1)
EOS%: 1.7 % (ref 0.0–7.0)
Eosinophils Absolute: 0.1 10*3/uL (ref 0.0–0.5)
HCT: 46 % (ref 38.4–49.9)
HEMOGLOBIN: 15.1 g/dL (ref 13.0–17.1)
LYMPH%: 21.6 % (ref 14.0–49.0)
MCH: 29.4 pg (ref 27.2–33.4)
MCHC: 32.8 g/dL (ref 32.0–36.0)
MCV: 89.4 fL (ref 79.3–98.0)
MONO#: 0.3 10*3/uL (ref 0.1–0.9)
MONO%: 6.1 % (ref 0.0–14.0)
NEUT%: 69.7 % (ref 39.0–75.0)
NEUTROS ABS: 3.5 10*3/uL (ref 1.5–6.5)
Platelets: 212 10*3/uL (ref 140–400)
RBC: 5.15 10*6/uL (ref 4.20–5.82)
RDW: 13.6 % (ref 11.0–14.6)
WBC: 5.1 10*3/uL (ref 4.0–10.3)
lymph#: 1.1 10*3/uL (ref 0.9–3.3)

## 2016-04-15 MED ORDER — TOBRAMYCIN 0.3 % OP SOLN: 1.0000 [drp] | OPHTHALMIC | Status: DC

## 2016-04-15 MED ORDER — ONDANSETRON HCL 8 MG PO TABS: 8.0000 mg | ORAL_TABLET | Freq: Three times a day (TID) | ORAL | Status: DC | PRN

## 2016-04-15 NOTE — Assessment & Plan Note (Signed)
He had excellent response to treatment. CT scan in May 2017 show excellent response to treatment and last PET CT scan show he has achieved complete response. We will continue Nivolumab every 2 weeks for another 3 months before repeating imaging study, next due end of August 2017

## 2016-04-15 NOTE — Assessment & Plan Note (Signed)
He was evaluated at the GI service. CT scan show abnormal thickening in his stomach of unknown etiology. He felt that the proton pump inhibitor is not helping. Per GI recommendation, I think EGD is appropriate. The patient cancelled his appointment recently. I recommend he call to reschedule

## 2016-04-15 NOTE — Progress Notes (Signed)
Attempted IV insertion x 4-unsuccessful.  Long discussion with patient regarding port a cath insertion. Pt agreeable and would like scheduled as soon as possible. Spoke with Cameo, RN with Dr. Alvy Bimler and she will schedule it. Pt prefers either a Wednesday or a Friday and prefers left chest for port site. Pt did not want any further IV attempts today and states he is ok not getting huis treatment today. Cameo, RN  With Dr. Alvy Bimler made aware of no Nivolumab today.

## 2016-04-15 NOTE — Assessment & Plan Note (Signed)
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 I recommend continue conservative management with physical therapy, topical lidocaine and pain medicine as needed The patient is not compliant. I will send another physical therapy consultation and alerted the navigator about the appointment

## 2016-04-15 NOTE — Assessment & Plan Note (Signed)
The patient has conjunctivitis affecting the left eye. I will prescribe topical eyedrops

## 2016-04-15 NOTE — Progress Notes (Signed)
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 Pan-endoscopy elsewhere showed cancer from nasopharyngeal space.   01/04/2007 - 02/20/2007 Chemotherapy He received 2 cycles of cisplatin and 5FU followed by concurrent chemo with weekly cisplatin and radiation. He only received 2 doses of chemo due to severe mucositis, nausea and weight loss.   04/05/2007 - 08/04/2007 Chemotherapy He received 4 more courses of cisplatin with 5FU and had complete response   07/05/2009 Procedure Fine-needle aspirate of the right level II lymph nodes come from recurrent metastatic disease. Repeat endoscopy and CT scan show no evidence of disease elsewhere.   07/08/2009 - 12/02/2009 Chemotherapy He was given 6 cycles of carboplatin, 5-FU and docetaxel   12/03/2009 Surgery He has surgery to the residual lymph node on the right neck which showed no evidence of disease.   02/22/2012 Imaging Repeat imaging study showed large recurrent mass. He was referred elsewhere for further treatment.   05/03/2012 Surgery He  underwent left upper lobectomy.   04/29/2013 Imaging PEt scan showed lesion on right level II B and lower lung was abnormal   06/03/2013 - 02/02/2014 Chemotherapy He had 6 cycles of chemotherapy when he was found to have recurrence of cancer and had received oxaliplatin and capecitabine   06/07/2014 Imaging PET CT scan showed persistent disease in the right neck lymph nodes and left lung   06/29/2014 Procedure Accession: [id number] repeat LUL biopsy confirmed metastatic cancer   07/18/2014 - 07/31/2014 Radiation Therapy He received palliative radiation therapy to the lungs   10/10/2014 Imaging CT scan of the chest, abdomen and pelvis show regression in the size of the lung nodule in the left upper lobe and stable pulmonary nodules   01/24/2015 Imaging CT scan showed stable disease in neck and lung   06/19/2015 Imaging CT scan of the neck and the chest show possible mild progression of the nodule in the right side of the neck.   06/25/2015 Imaging PET scan confirmed disease recurrence in the neck   07/07/2015 Imaging He had MRI neck at [facility]   09/03/2015 -  Chemotherapy He received palliative chemo with Nivolumab   10/29/2015 Imaging PET CT showed positive response to Rx   02/28/2016 Imaging Ct abdomen showed abnormal thinkening in his stomach   03/03/2016 Imaging CT: Right sternocleidomastoid muscle metastasis appears less distinct but otherwise not significantly changed in size or configuration since 06/19/2015.2. Left level 3 lymph node which was hypermetabolic by PET-CT in January 2017 appears slightly smaller   04/01/2016 Imaging CT cervical spine showed no acute fracture or traumatic malalignment in the cervical spine  INTERVAL HISTORY: Please see below for problem oriented charting. He returns for further follow-up. He is miserable. He complained of conjunctivitis affecting his left eye. He continues a severe right neck pain but has declined taking pain medicine. He continues to have chronic nausea and  epigastric discomfort. He consult his recent EGD due to feeling unwell.  REVIEW OF SYSTEMS:   Constitutional: Denies fevers, chills or abnormal weight loss Eyes: Denies blurriness of vision Ears, nose, mouth, throat, and face: Denies mucositis or sore throat Respiratory: Denies cough, dyspnea or wheezes Cardiovascular: Denies palpitation, chest discomfort or lower extremity swelling Skin: Denies abnormal skin rashes Lymphatics: Denies new lymphadenopathy or easy bruising Neurological:Denies numbness, tingling or new weaknesses Behavioral/Psych: Mood is stable, no new changes  All other systems were reviewed with the patient and are negative.  I have reviewed the past medical history, past surgical history, social history and family history with the patient and they are unchanged from previous note.  ALLERGIES:  has No Known Allergies.  MEDICATIONS:  Current Outpatient Prescriptions  Medication Sig Dispense Refill  . levothyroxine (SYNTHROID, LEVOTHROID) 75 MCG tablet TAKE 1 TABLET BY MOUTH EVERY DAY BEFORE BREAKFAST (Patient taking differently: TAKE 75 MCG BY MOUTH EVERY DAY BEFORE BREAKFAST) 90 tablet 0  . omeprazole (PRILOSEC) 20 MG capsule TAKE ONE CAPSULE BY MOUTH TWICE DAILY BEFORE A MEAL (Patient taking differently: TAKE 20 MG BY MOUTH TWICE DAILY BEFORE A MEAL) 600 capsule 1  . ondansetron (ZOFRAN) 8 MG tablet Take 1 tablet (8 mg total) by mouth every 8 (eight) hours as needed for nausea or vomiting. 90 tablet 1  . oxyCODONE (ROXICODONE) 15 MG immediate release tablet Take 7.5-15 mg by mouth every 4 (four) hours as needed for pain.    Marland Kitchen tobramycin (TOBREX) 0.3 % ophthalmic solution Place 1 drop into the left eye every 4 (four) hours. 5 mL 0   No current facility-administered medications for this visit.   Facility-Administered Medications Ordered in Other Visits  Medication Dose Route Frequency Provider Last Rate Last Dose  . sodium chloride 0.9 % injection 10 mL  10 mL  Intracatheter PRN [name], MD        PHYSICAL EXAMINATION: ECOG PERFORMANCE STATUS: 1 - Symptomatic but completely ambulatory  Filed Vitals:   04/15/16 1126  BP: 109/58  Pulse: 84  Temp: 98.1 F (36.7 C)  Resp: 18   Filed Weights   04/15/16 1126  Weight: 161 lb 3.2 oz (73.12 kg)    GENERAL:alert, no distress and comfortable SKIN: skin color, texture, turgor are normal, no rashes or significant lesions EYES: Noted left eye conjunctivitis OROPHARYNX:no exudate, no erythema and lips, buccal mucosa, and tongue normal  NECK: He has significant neck fibrosis from prior surgery and radiation LYMPH:  no palpable lymphadenopathy in the cervical, axillary or inguinal LUNGS: clear to auscultation and percussion with normal breathing effort HEART: regular rate & rhythm and no murmurs and no lower extremity edema ABDOMEN:abdomen soft, he has mild epigastric discomfort Musculoskeletal:no cyanosis of digits and no clubbing  NEURO: alert & oriented x 3 with fluent speech, no focal motor/sensory deficits  LABORATORY DATA:  I have reviewed the data as listed    Component Value Date/Time   NA 141 04/15/2016 1114   NA 141 06/29/2014 0655   K 3.8 04/15/2016 1114   K 4.4 06/29/2014 0655   CL 104 06/29/2014 0655   CO2 25 04/15/2016 1114   CO2 28 06/29/2014 0655   GLUCOSE 115 04/15/2016 1114  GLUCOSE 95 06/29/2014 0655   BUN 10.6 04/15/2016 1114   BUN 19 06/29/2014 0655   CREATININE 1.0 04/15/2016 1114   CREATININE 0.97 06/29/2014 0655   CALCIUM 9.4 04/15/2016 1114   CALCIUM 9.4 06/29/2014 0655   PROT 6.6 04/15/2016 1114   PROT 7.1 06/29/2014 0655   ALBUMIN 3.8 04/15/2016 1114   ALBUMIN 4.1 06/29/2014 0655   AST 18 04/15/2016 1114   AST 29 06/29/2014 0655   ALT 15 04/15/2016 1114   ALT 24 06/29/2014 0655   ALKPHOS 63 04/15/2016 1114   ALKPHOS 47 06/29/2014 0655   BILITOT <0.30 04/15/2016 1114   BILITOT 0.3 06/29/2014 0655   GFRNONAA >90 06/29/2014 0655   GFRAA >90  06/29/2014 0655    No results found for: SPEP, UPEP  Lab Results  Component Value Date   WBC 5.1 04/15/2016   NEUTROABS 3.5 04/15/2016   HGB 15.1 04/15/2016   HCT 46.0 04/15/2016   MCV 89.4 04/15/2016   PLT 212 04/15/2016      Chemistry      Component Value Date/Time   NA 141 04/15/2016 1114   NA 141 06/29/2014 0655   K 3.8 04/15/2016 1114   K 4.4 06/29/2014 0655   CL 104 06/29/2014 0655   CO2 25 04/15/2016 1114   CO2 28 06/29/2014 0655   BUN 10.6 04/15/2016 1114   BUN 19 06/29/2014 0655   CREATININE 1.0 04/15/2016 1114   CREATININE 0.97 06/29/2014 0655      Component Value Date/Time   CALCIUM 9.4 04/15/2016 1114   CALCIUM 9.4 06/29/2014 0655   ALKPHOS 63 04/15/2016 1114   ALKPHOS 47 06/29/2014 0655   AST 18 04/15/2016 1114   AST 29 06/29/2014 0655   ALT 15 04/15/2016 1114   ALT 24 06/29/2014 0655   BILITOT <0.30 04/15/2016 1114   BILITOT 0.3 06/29/2014 0655      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[place]) He had excellent response to treatment. CT scan in May 2017 show excellent response to treatment and last PET CT scan show he has achieved complete response. 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 I recommend continue conservative management with physical therapy, topical lidocaine and pain medicine as needed The patient is not compliant. I will send another physical therapy consultation and alerted the navigator about the appointment     Epigastric pain He was evaluated at the GI service. CT scan show abnormal thickening in his stomach of unknown etiology. He felt that the proton pump inhibitor is not helping. Per GI recommendation, I think EGD is appropriate. The patient cancelled his appointment recently. I recommend he call to  reschedule    Poor venous access He has been very reluctant for port placement. Today, infusion nurse was not able to get venous access. Ultimately, he consented for port placement. I will delay his chemotherapy until next week and put in a request for port placement  Conjunctivitis of left eye The patient has conjunctivitis affecting the left eye. I will prescribe topical eyedrops   Orders Placed This Encounter  Procedures  . Ambulatory referral to Physical Therapy    Referral Priority:  Routine    Referral Type:  Physical Medicine    Referral Reason:  Specialty Services Required  Requested Specialty:  Physical Therapy    Number of Visits Requested:  1   All questions were answered. The patient knows to call the clinic with any problems, questions or concerns. No barriers to learning was detected. I spent 25 minutes counseling the patient face to face. The total time spent in the appointment was 30 minutes and more than 50% was on counseling and review of test results     [facility], [place], MD 04/15/2016 3:49 PM

## 2016-04-15 NOTE — Assessment & Plan Note (Addendum)
He has been very reluctant for port placement. Today, infusion nurse was not able to get venous access. Ultimately, he consented for port placement. I will delay his chemotherapy until next week and put in a request for port placement

## 2016-04-16 ENCOUNTER — Other Ambulatory Visit: Payer: Self-pay | Admitting: Hematology and Oncology

## 2016-04-17 ENCOUNTER — Telehealth: Payer: Self-pay | Admitting: *Deleted

## 2016-04-17 ENCOUNTER — Telehealth: Payer: Self-pay | Admitting: Hematology and Oncology

## 2016-04-17 NOTE — Telephone Encounter (Signed)
I left VM for pt informing him of appts on Wed 7/19.  Need to come to [facility] at 8:30 am for lab first, then go to Radiology for his [facility] placement.   IR will leave PAC accessed and pt to return to [facility] in afternoon for his treatment.   Asked pt to please call us back if any questions.   IR should have contacted him about instructions for the [facility] procedure.

## 2016-04-17 NOTE — Telephone Encounter (Signed)
Appointments complete per 7/12 pof's (two) and 7/13 pof. Per desk nurse she will contact patient re appointments.

## 2016-04-21 ENCOUNTER — Other Ambulatory Visit: Payer: Self-pay | Admitting: Physician Assistant

## 2016-04-22 ENCOUNTER — Other Ambulatory Visit (HOSPITAL_BASED_OUTPATIENT_CLINIC_OR_DEPARTMENT_OTHER): Payer: Medicaid Other

## 2016-04-22 ENCOUNTER — Encounter (HOSPITAL_COMMUNITY): Payer: Self-pay

## 2016-04-22 ENCOUNTER — Ambulatory Visit (HOSPITAL_COMMUNITY)
Admission: RE | Admit: 2016-04-22 | Discharge: 2016-04-22 | Disposition: A | Discharge status: Home / Self Care | Payer: Medicaid Other | Source: Ambulatory Visit | Attending: Hematology and Oncology | Admitting: Hematology and Oncology

## 2016-04-22 ENCOUNTER — Ambulatory Visit (HOSPITAL_BASED_OUTPATIENT_CLINIC_OR_DEPARTMENT_OTHER): Payer: Medicaid Other

## 2016-04-22 ENCOUNTER — Other Ambulatory Visit: Payer: Self-pay | Admitting: Hematology and Oncology

## 2016-04-22 VITALS — BP 117/74 | HR 71 | Temp 97.6°F | Resp 16

## 2016-04-22 DIAGNOSIS — C760 Malignant neoplasm of head, face and neck: Secondary | ICD-10-CM

## 2016-04-22 DIAGNOSIS — G629 Polyneuropathy, unspecified: Secondary | ICD-10-CM | POA: Insufficient documentation

## 2016-04-22 DIAGNOSIS — C7800 Secondary malignant neoplasm of unspecified lung: Secondary | ICD-10-CM

## 2016-04-22 DIAGNOSIS — Z8249 Family history of ischemic heart disease and other diseases of the circulatory system: Secondary | ICD-10-CM | POA: Diagnosis not present

## 2016-04-22 DIAGNOSIS — C119 Malignant neoplasm of nasopharynx, unspecified: Secondary | ICD-10-CM | POA: Diagnosis present

## 2016-04-22 DIAGNOSIS — Z5112 Encounter for antineoplastic immunotherapy: Secondary | ICD-10-CM

## 2016-04-22 DIAGNOSIS — E039 Hypothyroidism, unspecified: Secondary | ICD-10-CM | POA: Diagnosis not present

## 2016-04-22 DIAGNOSIS — C7801 Secondary malignant neoplasm of right lung: Secondary | ICD-10-CM

## 2016-04-22 DIAGNOSIS — Z833 Family history of diabetes mellitus: Secondary | ICD-10-CM | POA: Diagnosis not present

## 2016-04-22 LAB — CBC WITH DIFFERENTIAL/PLATELET
BASO%: 1.1 % (ref 0.0–2.0)
BASOS ABS: 0.1 10*3/uL (ref 0.0–0.1)
EOS ABS: 0.1 10*3/uL (ref 0.0–0.5)
EOS%: 2.2 % (ref 0.0–7.0)
HEMATOCRIT: 43.6 % (ref 38.4–49.9)
HEMOGLOBIN: 14.7 g/dL (ref 13.0–17.1)
LYMPH#: 1.2 10*3/uL (ref 0.9–3.3)
LYMPH%: 24.2 % (ref 14.0–49.0)
MCH: 29.9 pg (ref 27.2–33.4)
MCHC: 33.8 g/dL (ref 32.0–36.0)
MCV: 88.5 fL (ref 79.3–98.0)
MONO#: 0.5 10*3/uL (ref 0.1–0.9)
MONO%: 9.6 % (ref 0.0–14.0)
NEUT%: 62.9 % (ref 39.0–75.0)
NEUTROS ABS: 3.1 10*3/uL (ref 1.5–6.5)
Platelets: 202 10*3/uL (ref 140–400)
RBC: 4.93 10*6/uL (ref 4.20–5.82)
RDW: 13.7 % (ref 11.0–14.6)
WBC: 4.9 10*3/uL (ref 4.0–10.3)

## 2016-04-22 LAB — COMPREHENSIVE METABOLIC PANEL
ALBUMIN: 3.8 g/dL (ref 3.5–5.0)
ALK PHOS: 62 U/L (ref 40–150)
ALT: 15 U/L (ref 0–55)
AST: 18 U/L (ref 5–34)
Anion Gap: 9 mEq/L (ref 3–11)
BILIRUBIN TOTAL: 0.31 mg/dL (ref 0.20–1.20)
BUN: 14.3 mg/dL (ref 7.0–26.0)
CALCIUM: 9.2 mg/dL (ref 8.4–10.4)
CO2: 25 mEq/L (ref 22–29)
CREATININE: 0.9 mg/dL (ref 0.7–1.3)
Chloride: 108 mEq/L (ref 98–109)
EGFR: >90 mL/min/{1.73_m2} (ref >90)
GLUCOSE: 104 mg/dL (ref 70–140)
Potassium: 3.7 mEq/L (ref 3.5–5.1)
SODIUM: 142 meq/L (ref 136–145)
TOTAL PROTEIN: 6.6 g/dL (ref 6.4–8.3)

## 2016-04-22 LAB — APTT: APTT: 28 s (ref 24–37)

## 2016-04-22 LAB — PROTIME-INR
INR: 0.93 (ref 0.00–1.49)
PROTHROMBIN TIME: 12.7 s (ref 11.6–15.2)

## 2016-04-22 MED ORDER — NIVOLUMAB CHEMO INJECTION 100 MG/10ML
240.0000 mg | Freq: Once | INTRAVENOUS | Status: AC
  Administered 2016-04-22: 240 mg via INTRAVENOUS
  Filled 2016-04-22: qty 20

## 2016-04-22 MED ORDER — HEPARIN SOD (PORK) LOCK FLUSH 100 UNIT/ML IV SOLN
500.0000 [IU] | Freq: Once | INTRAVENOUS | Status: AC | PRN
  Administered 2016-04-22: 500 [IU]
  Filled 2016-04-22: qty 5

## 2016-04-22 MED ORDER — FENTANYL CITRATE (PF) 100 MCG/2ML IJ SOLN
INTRAMUSCULAR | Status: AC
  Filled 2016-04-22: qty 4

## 2016-04-22 MED ORDER — SODIUM CHLORIDE 0.9 % IJ SOLN
10.0000 mL | INTRAMUSCULAR | Status: DC | PRN
  Administered 2016-04-22: 10 mL
  Filled 2016-04-22: qty 10

## 2016-04-22 MED ORDER — MIDAZOLAM HCL 2 MG/2ML IJ SOLN
INTRAMUSCULAR | Status: AC
  Filled 2016-04-22: qty 6

## 2016-04-22 MED ORDER — LIDOCAINE HCL 1 % IJ SOLN
INTRAMUSCULAR | Status: AC | PRN
  Administered 2016-04-22: 20 mL

## 2016-04-22 MED ORDER — SODIUM CHLORIDE 0.9 % IV SOLN
Freq: Once | INTRAVENOUS | Status: AC
  Administered 2016-04-22: 14:00:00 via INTRAVENOUS

## 2016-04-22 MED ORDER — LIDOCAINE HCL 1 % IJ SOLN
INTRAMUSCULAR | Status: AC
  Filled 2016-04-22: qty 20

## 2016-04-22 MED ORDER — MIDAZOLAM HCL 2 MG/2ML IJ SOLN
INTRAMUSCULAR | Status: AC | PRN
  Administered 2016-04-22 (×4): 1 mg via INTRAVENOUS

## 2016-04-22 MED ORDER — SODIUM CHLORIDE 0.9 % IV SOLN
INTRAVENOUS | Status: DC
  Administered 2016-04-22: 10:00:00 via INTRAVENOUS

## 2016-04-22 MED ORDER — FENTANYL CITRATE (PF) 100 MCG/2ML IJ SOLN
INTRAMUSCULAR | Status: AC | PRN
  Administered 2016-04-22: 50 ug via INTRAVENOUS
  Administered 2016-04-22: 25 ug via INTRAVENOUS

## 2016-04-22 MED ORDER — CEFAZOLIN SODIUM-DEXTROSE 2-4 GM/100ML-% IV SOLN
2.0000 g | Freq: Once | INTRAVENOUS | Status: AC
  Administered 2016-04-22: 2 g via INTRAVENOUS
  Filled 2016-04-22: qty 100

## 2016-04-22 NOTE — Patient Instructions (Signed)
Implanted Port Home Guide An implanted port is a type of central line that is placed under the skin. Central lines are used to provide IV access when treatment or nutrition needs to be given through a person's veins. Implanted ports are used for long-term IV access. An implanted port may be placed because:   You need IV medicine that would be irritating to the small veins in your hands or arms.   You need long-term IV medicines, such as antibiotics.   You need IV nutrition for a long period.   You need frequent blood draws for lab tests.   You need dialysis.  Implanted ports are usually placed in the chest area, but they can also be placed in the upper arm, the abdomen, or the leg. An implanted port has two main parts:   Reservoir. The reservoir is round and will appear as a small, raised area under your skin. The reservoir is the part where a needle is inserted to give medicines or draw blood.   Catheter. The catheter is a thin, flexible tube that extends from the reservoir. The catheter is placed into a large vein. Medicine that is inserted into the reservoir goes into the catheter and then into the vein.  HOW WILL I CARE FOR MY INCISION SITE? Do not get the incision site wet. Bathe or shower as directed by your health care provider.  HOW IS MY PORT ACCESSED? Special steps must be taken to access the port:   Before the port is accessed, a numbing cream can be placed on the skin. This helps numb the skin over the port site.   Your health care provider uses a sterile technique to access the port.  Your health care provider must put on a mask and sterile gloves.  The skin over your port is cleaned carefully with an antiseptic and allowed to dry.  The port is gently pinched between sterile gloves, and a needle is inserted into the port.  Only "non-coring" port needles should be used to access the port. Once the port is accessed, a blood return should be checked. This helps  ensure that the port is in the vein and is not clogged.   If your port needs to remain accessed for a constant infusion, a clear (transparent) bandage will be placed over the needle site. The bandage and needle will need to be changed every week, or as directed by your health care provider.   Keep the bandage covering the needle clean and dry. Do not get it wet. Follow your health care provider's instructions on how to take a shower or bath while the port is accessed.   If your port does not need to stay accessed, no bandage is needed over the port.  WHAT IS FLUSHING? Flushing helps keep the port from getting clogged. Follow your health care provider's instructions on how and when to flush the port. Ports are usually flushed with saline solution or a medicine called heparin. The need for flushing will depend on how the port is used.   If the port is used for intermittent medicines or blood draws, the port will need to be flushed:   After medicines have been given.   After blood has been drawn.   As part of routine maintenance.   If a constant infusion is running, the port may not need to be flushed.  HOW LONG WILL MY PORT STAY IMPLANTED? The port can stay in for as long as your health care   provider thinks it is needed. When it is time for the port to come out, surgery will be done to remove it. The procedure is similar to the one performed when the port was put in.  WHEN SHOULD I SEEK IMMEDIATE MEDICAL CARE? When you have an implanted port, you should seek immediate medical care if:   You notice a bad smell coming from the incision site.   You have swelling, redness, or drainage at the incision site.   You have more swelling or pain at the port site or the surrounding area.   You have a fever that is not controlled with medicine.   This information is not intended to replace advice given to you by your health care provider. Make sure you discuss any questions you have with  your health care provider.   Document Released: 09/21/2005 Document Revised: 07/12/2013 Document Reviewed: 05/29/2013 Elsevier Interactive Patient Education 2016 [place] What is this medicine? NIVOLUMAB (nye VOL ue mab) is a monoclonal antibody. It is used to treat melanoma, lung cancer, kidney cancer, and Hodgkin lymphoma. This medicine may be used for other purposes; ask your health care provider or pharmacist if you have questions. What should I tell my health care provider before I take this medicine? They need to know if you have any of these conditions: -diabetes -immune system problems -kidney disease -liver disease -lung disease -organ transplant -stomach or intestine problems -thyroid disease -an unusual or allergic reaction to nivolumab, other medicines, foods, dyes, or preservatives -pregnant or trying to get pregnant -breast-feeding How should I use this medicine? This medicine is for infusion into a vein. It is given by a health care professional in a hospital or clinic setting. A special MedGuide will be given to you before each treatment. Be sure to read this information carefully each time. Talk to your pediatrician regarding the use of this medicine in children. Special care may be needed. Overdosage: If you think you have taken too much of this medicine contact a poison control center or emergency room at once. NOTE: This medicine is only for you. Do not share this medicine with others. What if I miss a dose? It is important not to miss your dose. Call your doctor or health care professional if you are unable to keep an appointment. What may interact with this medicine? Interactions have not been studied. Give your health care provider a list of all the medicines, herbs, non-prescription drugs, or dietary supplements you use. Also tell them if you smoke, drink alcohol, or use illegal drugs. Some items may interact with your medicine. This  list may not describe all possible interactions. Give your health care provider a list of all the medicines, herbs, non-prescription drugs, or dietary supplements you use. Also tell them if you smoke, drink alcohol, or use illegal drugs. Some items may interact with your medicine. What should I watch for while using this medicine? This drug may make you feel generally unwell. Continue your course of treatment even though you feel ill unless your doctor tells you to stop. You may need blood work done while you are taking this medicine. Do not become pregnant while taking this medicine or for 5 months after stopping it. Women should inform their doctor if they wish to become pregnant or think they might be pregnant. There is a potential for serious side effects to an unborn child. Talk to your health care professional or pharmacist for more information. Do not breast-feed an  infant while taking this medicine. What side effects may I notice from receiving this medicine? Side effects that you should report to your doctor or health care professional as soon as possible: -allergic reactions like skin rash, itching or hives, swelling of the face, lips, or tongue -black, tarry stools -blood in the urine -bloody or watery diarrhea -changes in vision -change in sex drive -changes in emotions or moods -chest pain -confusion -cough -decreased appetite -diarrhea -facial flushing -feeling faint or lightheaded -fever, chills -hair loss -hallucination, loss of contact with reality -headache -irritable -joint pain -loss of memory -muscle pain -muscle weakness -seizures -shortness of breath -signs and symptoms of high blood sugar such as dizziness; dry mouth; dry skin; fruity breath; nausea; stomach pain; increased hunger or thirst; increased urination -signs and symptoms of kidney injury like trouble passing urine or change in the amount of urine -signs and symptoms of liver injury like dark yellow  or brown urine; general ill feeling or flu-like symptoms; light-colored stools; loss of appetite; nausea; right upper belly pain; unusually weak or tired; yellowing of the eyes or skin -stiff neck -swelling of the ankles, feet, hands -weight gain Side effects that usually do not require medical attention (report to your doctor or health care professional if they continue or are bothersome): -bone pain -constipation -tiredness -vomiting This list may not describe all possible side effects. Call your doctor for medical advice about side effects. You may report side effects to FDA at 1-800-FDA-1088. Where should I keep my medicine? This drug is given in a hospital or clinic and will not be stored at home. NOTE: This sheet is a summary. It may not cover all possible information. If you have questions about this medicine, talk to your doctor, pharmacist, or health care provider.    2016, Elsevier/Gold Standard. (2015-02-20 10:03:42)

## 2016-04-22 NOTE — Progress Notes (Signed)
Patient reports pain in right neck and around to upper back.  Notified Selena Lesser, NP of issues and she will come assess patient.
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
## 2016-04-22 NOTE — Procedures (Signed)
Interventional Radiology Procedure Note  Procedure: Placement of a right IJ approach single lumen PowerPort.  Tip is positioned at the superior cavoatrial junction and catheter is ready for immediate use.  Complications: No immediate Recommendations:  - Ok to shower tomorrow - Do not submerge for 7 days - Routine line care   Darchelle Nunes T. Myking Sar, M.D Pager:  [phone]   

## 2016-04-22 NOTE — Progress Notes (Signed)
Pt transported via wheelchair to [facility] for appointment.  Handoff report given to [facility], RN along with discharge instructions for post port insertion and conscious sedation. Also, PIV in right arm, for removal prior to discharge in care of family member. RN verbalizes understanding.

## 2016-04-22 NOTE — Discharge Instructions (Signed)
Implanted Port Home Guide °An implanted port is a type of central line that is placed under the skin. Central lines are used to provide IV access when treatment or nutrition needs to be given through a person's veins. Implanted ports are used for long-term IV access. An implanted port may be placed because:  °· You need IV medicine that would be irritating to the small veins in your hands or arms.   °· You need long-term IV medicines, such as antibiotics.   °· You need IV nutrition for a long period.   °· You need frequent blood draws for lab tests.   °· You need dialysis.   °Implanted ports are usually placed in the chest area, but they can also be placed in the upper arm, the abdomen, or the leg. An implanted port has two main parts:  °· Reservoir. The reservoir is round and will appear as a small, raised area under your skin. The reservoir is the part where a needle is inserted to give medicines or draw blood.   °· Catheter. The catheter is a thin, flexible tube that extends from the reservoir. The catheter is placed into a large vein. Medicine that is inserted into the reservoir goes into the catheter and then into the vein.   °HOW WILL I CARE FOR MY INCISION SITE? °Do not get the incision site wet. Bathe or shower as directed by your health care provider.  °HOW IS MY PORT ACCESSED? °Special steps must be taken to access the port:  °· Before the port is accessed, a numbing cream can be placed on the skin. This helps numb the skin over the port site.   °· Your health care provider uses a sterile technique to access the port. °¨ Your health care provider must put on a mask and sterile gloves. °¨ The skin over your port is cleaned carefully with an antiseptic and allowed to dry. °¨ The port is gently pinched between sterile gloves, and a needle is inserted into the port. °· Only "non-coring" port needles should be used to access the port. Once the port is accessed, a blood return should be checked. This helps  ensure that the port is in the vein and is not clogged.   °· If your port needs to remain accessed for a constant infusion, a clear (transparent) bandage will be placed over the needle site. The bandage and needle will need to be changed every week, or as directed by your health care provider.   °· Keep the bandage covering the needle clean and dry. Do not get it wet. Follow your health care provider's instructions on how to take a shower or bath while the port is accessed.   °· If your port does not need to stay accessed, no bandage is needed over the port.   °WHAT IS FLUSHING? °Flushing helps keep the port from getting clogged. Follow your health care provider's instructions on how and when to flush the port. Ports are usually flushed with saline solution or a medicine called heparin. The need for flushing will depend on how the port is used.  °· If the port is used for intermittent medicines or blood draws, the port will need to be flushed:   °¨ After medicines have been given.   °¨ After blood has been drawn.   °¨ As part of routine maintenance.   °· If a constant infusion is running, the port may not need to be flushed.   °HOW LONG WILL MY PORT STAY IMPLANTED? °The port can stay in for as long as your health care   provider thinks it is needed. When it is time for the port to come out, surgery will be done to remove it. The procedure is similar to the one performed when the port was put in.  °WHEN SHOULD I SEEK IMMEDIATE MEDICAL CARE? °When you have an implanted port, you should seek immediate medical care if:  °· You notice a bad smell coming from the incision site.   °· You have swelling, redness, or drainage at the incision site.   °· You have more swelling or pain at the port site or the surrounding area.   °· You have a fever that is not controlled with medicine. °  °This information is not intended to replace advice given to you by your health care provider. Make sure you discuss any questions you have with  your health care provider. °  °Document Released: 09/21/2005 Document Revised: 07/12/2013 Document Reviewed: 05/29/2013 °Elsevier Interactive Patient Education ©2016 [employer] °Implanted Port Insertion, Care After °Refer to this sheet in the next few weeks. These instructions provide you with information on caring for yourself after your procedure. Your health care provider may also give you more specific instructions. Your treatment has been planned according to current medical practices, but problems sometimes occur. Call your health care provider if you have any problems or questions after your procedure. °WHAT TO EXPECT AFTER THE PROCEDURE °After your procedure, it is typical to have the following:  °· Discomfort at the port insertion site. Ice packs to the area will help. °· Bruising on the skin over the port. This will subside in 3-4 days. °HOME CARE INSTRUCTIONS °· After your port is placed, you will get a manufacturer's information card. The card has information about your port. Keep this card with you at all times.   °· Know what kind of port you have. There are many types of ports available.   °· Wear a medical alert bracelet in case of an emergency. This can help alert health care workers that you have a port.   °· The port can stay in for as long as your health care provider believes it is necessary.   °· A home health care nurse may give medicines and take care of the port.   °· You or a family member can get special training and directions for giving medicine and taking care of the port at home.   °SEEK MEDICAL CARE IF:  °· Your port does not flush or you are unable to get a blood return.   °· You have a fever or chills. °SEEK IMMEDIATE MEDICAL CARE IF: °· You have new fluid or pus coming from your incision.   °· You notice a bad smell coming from your incision site.   °· You have swelling, pain, or more redness at the incision or port site.   °· You have chest pain or shortness of breath. °  °This  information is not intended to replace advice given to you by your health care provider. Make sure you discuss any questions you have with your health care provider. °  °Document Released: 07/12/2013 Document Revised: 09/26/2013 Document Reviewed: 07/12/2013 °Elsevier Interactive Patient Education ©2016 [employer] °Moderate Conscious Sedation, Adult °Sedation is the use of medicines to promote relaxation and relieve discomfort and anxiety. Moderate conscious sedation is a type of sedation. Under moderate conscious sedation you are less alert than normal but are still able to respond to instructions or stimulation. Moderate conscious sedation is used during short medical and dental procedures. It is milder than deep sedation or general anesthesia and   allows you to return to your regular activities sooner. LET [facility] CARE PROVIDER KNOW ABOUT:   Any allergies you have.  All medicines you are taking, including vitamins, herbs, eye drops, creams, and over-the-counter medicines.  Use of steroids (by mouth or creams).  Previous problems you or members of your family have had with the use of anesthetics.  Any blood disorders you have.  Previous surgeries you have had.  Medical conditions you have.  Possibility of pregnancy, if this applies.  Use of cigarettes, alcohol, or illegal drugs. RISKS AND COMPLICATIONS Generally, this is a safe procedure. However, as with any procedure, problems can occur. Possible problems include:  Oversedation.  Trouble breathing on your own. You may need to have a breathing tube until you are awake and breathing on your own.  Allergic reaction to any of the medicines used for the procedure. BEFORE THE PROCEDURE  You may have blood tests done. These tests can help show how well your kidneys and liver are working. They can also show how well your blood clots.  A physical exam will be done.  Only take medicines as directed by your health care provider.  You may need to stop taking medicines (such as blood thinners, aspirin, or nonsteroidal anti-inflammatory drugs) before the procedure.   Do not eat or drink at least 6 hours before the procedure or as directed by your health care provider.  Arrange for a responsible adult, family member, or friend to take you home after the procedure. He or she should stay with you for at least 24 hours after the procedure, until the medicine has worn off. PROCEDURE   An intravenous (IV) catheter will be inserted into one of your veins. Medicine will be able to flow directly into your body through this catheter. You may be given medicine through this tube to help prevent pain and help you relax.  The medical or dental procedure will be done. AFTER THE PROCEDURE  You will stay in a recovery area until the medicine has worn off. Your blood pressure and pulse will be checked.   Depending on the procedure you had, you may be allowed to go home when you can tolerate liquids and your pain is under control.   This information is not intended to replace advice given to you by your health care provider. Make sure you discuss any questions you have with your health care provider.   Document Released: 06/16/2001 Document Revised: 10/12/2014 Document Reviewed: 05/29/2013 Elsevier Interactive Patient Education 2016 [place] not use numbing cream for 1 week post procedure.  Do not submerge in water until site is healed.  Call IR dept [phone] for any problems, concerns with port.  May remove dressing and shower after 24 to 48 hours post procedure. Keep site clean and dry.

## 2016-04-29 ENCOUNTER — Ambulatory Visit: Payer: Medicaid Other | Attending: Hematology and Oncology | Admitting: Physical Therapy

## 2016-05-06 ENCOUNTER — Other Ambulatory Visit (HOSPITAL_BASED_OUTPATIENT_CLINIC_OR_DEPARTMENT_OTHER): Payer: Medicaid Other

## 2016-05-06 ENCOUNTER — Other Ambulatory Visit: Payer: Self-pay | Admitting: *Deleted

## 2016-05-06 ENCOUNTER — Ambulatory Visit: Payer: Medicaid Other

## 2016-05-06 ENCOUNTER — Ambulatory Visit (HOSPITAL_BASED_OUTPATIENT_CLINIC_OR_DEPARTMENT_OTHER): Payer: Medicaid Other

## 2016-05-06 ENCOUNTER — Encounter: Payer: Self-pay | Admitting: *Deleted

## 2016-05-06 VITALS — BP 103/62 | HR 74 | Temp 97.7°F | Resp 16

## 2016-05-06 DIAGNOSIS — Z5112 Encounter for antineoplastic immunotherapy: Secondary | ICD-10-CM

## 2016-05-06 DIAGNOSIS — Z79899 Other long term (current) drug therapy: Secondary | ICD-10-CM | POA: Diagnosis not present

## 2016-05-06 DIAGNOSIS — C7800 Secondary malignant neoplasm of unspecified lung: Secondary | ICD-10-CM

## 2016-05-06 DIAGNOSIS — C7801 Secondary malignant neoplasm of right lung: Secondary | ICD-10-CM

## 2016-05-06 DIAGNOSIS — E039 Hypothyroidism, unspecified: Secondary | ICD-10-CM

## 2016-05-06 DIAGNOSIS — C760 Malignant neoplasm of head, face and neck: Secondary | ICD-10-CM

## 2016-05-06 DIAGNOSIS — C119 Malignant neoplasm of nasopharynx, unspecified: Secondary | ICD-10-CM

## 2016-05-06 LAB — CBC WITH DIFFERENTIAL/PLATELET
BASO%: 0.7 % (ref 0.0–2.0)
Basophils Absolute: 0 10*3/uL (ref 0.0–0.1)
EOS%: 1.7 % (ref 0.0–7.0)
Eosinophils Absolute: 0.1 10*3/uL (ref 0.0–0.5)
HCT: 43.2 % (ref 38.4–49.9)
HGB: 14.4 g/dL (ref 13.0–17.1)
LYMPH#: 1.7 10*3/uL (ref 0.9–3.3)
LYMPH%: 26.4 % (ref 14.0–49.0)
MCH: 29.4 pg (ref 27.2–33.4)
MCHC: 33.2 g/dL (ref 32.0–36.0)
MCV: 88.4 fL (ref 79.3–98.0)
MONO#: 0.5 10*3/uL (ref 0.1–0.9)
MONO%: 8.3 % (ref 0.0–14.0)
NEUT%: 62.9 % (ref 39.0–75.0)
NEUTROS ABS: 4.1 10*3/uL (ref 1.5–6.5)
Platelets: 218 10*3/uL (ref 140–400)
RBC: 4.89 10*6/uL (ref 4.20–5.82)
RDW: 13.7 % (ref 11.0–14.6)
WBC: 6.6 10*3/uL (ref 4.0–10.3)

## 2016-05-06 LAB — COMPREHENSIVE METABOLIC PANEL
ALBUMIN: 3.8 g/dL (ref 3.5–5.0)
ALK PHOS: 56 U/L (ref 40–150)
ALT: 17 U/L (ref 0–55)
ANION GAP: 8 meq/L (ref 3–11)
AST: 20 U/L (ref 5–34)
BUN: 15.8 mg/dL (ref 7.0–26.0)
CO2: 25 meq/L (ref 22–29)
Calcium: 9.3 mg/dL (ref 8.4–10.4)
Chloride: 107 mEq/L (ref 98–109)
Creatinine: 0.9 mg/dL (ref 0.7–1.3)
GLUCOSE: 102 mg/dL (ref 70–140)
POTASSIUM: 3.8 meq/L (ref 3.5–5.1)
SODIUM: 140 meq/L (ref 136–145)
Total Bilirubin: <0.3 mg/dL (ref 0.20–1.20)
Total Protein: 6.5 g/dL (ref 6.4–8.3)

## 2016-05-06 LAB — TSH: TSH: 3.572 m(IU)/L (ref 0.320–4.118)

## 2016-05-06 MED ORDER — SODIUM CHLORIDE 0.9 % IV SOLN
Freq: Once | INTRAVENOUS | Status: AC
  Administered 2016-05-06: 11:00:00 via INTRAVENOUS

## 2016-05-06 MED ORDER — SODIUM CHLORIDE 0.9 % IJ SOLN
10.0000 mL | INTRAMUSCULAR | Status: DC | PRN
  Administered 2016-05-06: 10 mL
  Filled 2016-05-06: qty 10

## 2016-05-06 MED ORDER — SODIUM CHLORIDE 0.9 % IV SOLN
240.0000 mg | Freq: Once | INTRAVENOUS | Status: AC
  Administered 2016-05-06: 240 mg via INTRAVENOUS
  Filled 2016-05-06: qty 4

## 2016-05-06 MED ORDER — LIDOCAINE-PRILOCAINE 2.5-2.5 % EX CREA: 1.0000 "application " | TOPICAL_CREAM | CUTANEOUS | 3 refills | Status: DC | PRN

## 2016-05-06 MED ORDER — HEPARIN SOD (PORK) LOCK FLUSH 100 UNIT/ML IV SOLN
500.0000 [IU] | Freq: Once | INTRAVENOUS | Status: AC | PRN
  Administered 2016-05-06: 500 [IU]
  Filled 2016-05-06: qty 5

## 2016-05-06 NOTE — Progress Notes (Signed)
Patient called stating that he will be 1 hour late (max) due to taking his daughter to the hospital. Infusion notified.

## 2016-05-06 NOTE — Patient Instructions (Signed)
[facility] Discharge Instructions for Patients Receiving Chemotherapy  Today you received the following chemotherapy agents:  Nivolumab.  To help prevent nausea and vomiting after your treatment, we encourage you to take your nausea medication as directed.   If you develop nausea and vomiting that is not controlled by your nausea medication, call the clinic.   BELOW ARE SYMPTOMS THAT SHOULD BE REPORTED IMMEDIATELY:  *FEVER GREATER THAN 100.5 F  *CHILLS WITH OR WITHOUT FEVER  NAUSEA AND VOMITING THAT IS NOT CONTROLLED WITH YOUR NAUSEA MEDICATION  *UNUSUAL SHORTNESS OF BREATH  *UNUSUAL BRUISING OR BLEEDING  TENDERNESS IN MOUTH AND THROAT WITH OR WITHOUT PRESENCE OF ULCERS  *URINARY PROBLEMS  *BOWEL PROBLEMS  UNUSUAL RASH Items with * indicate a potential emergency and should be followed up as soon as possible.  Feel free to call the clinic you have any questions or concerns. The clinic phone number is [phone].  Please show the CHEMO ALERT CARD at check-in to the Emergency Department and triage nurse.   

## 2016-05-18 ENCOUNTER — Ambulatory Visit: Payer: Medicaid Other | Attending: Hematology and Oncology | Admitting: Physical Therapy

## 2016-05-18 ENCOUNTER — Encounter: Payer: Self-pay | Admitting: Physical Therapy

## 2016-05-18 DIAGNOSIS — M62838 Other muscle spasm: Secondary | ICD-10-CM

## 2016-05-18 DIAGNOSIS — M542 Cervicalgia: Secondary | ICD-10-CM | POA: Insufficient documentation

## 2016-05-18 DIAGNOSIS — R293 Abnormal posture: Secondary | ICD-10-CM | POA: Diagnosis present

## 2016-05-18 NOTE — Therapy (Signed)
[place], [place], [address] Phone: [phone]   Fax:  [phone]  Physical Therapy Evaluation  Patient Details  Name: Christian Simmons MRN: [id number] Date of Birth: [birth year] Referring Provider: [name] MD  Encounter Date: 05/18/2016      PT End of Session - 05/18/16 1553    Visit Number 1   Number of Visits 1   Date for PT Re-Evaluation 05/19/16   Authorization Type Medicaid   PT Start Time 1415   PT Stop Time 1501   PT Time Calculation (min) 46 min   Activity Tolerance Patient tolerated treatment well   Behavior During Therapy [facility] for tasks assessed/performed      Past Medical History:  Diagnosis Date  . Arrhythmia 12/25/2015  . Fatigue 06/01/2014  . Hypothyroidism   . Infection of eyelash follicle of left eye 9/47/0962  . 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 Neuropathy ([place])   . Radiation 07/18/14-07/31/14   Left upper lobe  40 gy in 10 fractions  . 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 LUNG REMOVAL, PARTIAL  05/03/2012   left upper lobectomy  . nasal biopsy    . RADICAL NECK DISSECTION    . VIDEO BRONCHOSCOPY N/A 06/29/2014   Procedure: VIDEO BRONCHOSCOPY ;  Surgeon: Melrose Nakayama, MD;  Location: [place];  Service: Thoracic;  Laterality: N/A;    There were no vitals filed for this visit.       Subjective Assessment - 05/18/16 1417    Subjective pt is a 30 y.o M with CC of R neck pain that has been going on for over 6 years that occured from surgery and keeping the arm in an elevated/ hiked position. pain starts in the R side and will radiated to the L side of the neck and to the lateral apsect of the R shoulder. reports multipl knots in the muscle. pain seems to fluctuate depending on activity as well as intermittent HA to the temple region. limited shoulder mobilty with pain  the neck and shoulder.    Limitations Lifting;House hold activities   How long can you sit comfortably? unlimted   How long can you stand comfortably? unlimited   How long can you walk comfortably? unlimited   Diagnostic tests 04/01/2016 CT scan in the neck    Patient Stated Goals decrease pain, get motion back,    Currently in Pain? Yes   Pain Score 5    Pain Location Neck   Pain Orientation Right;Upper;Posterior   Pain Descriptors / Indicators Tightness;Sore;Aching   Pain Type Chronic pain   Pain Radiating Towards to the top of the R shoulder   Pain Onset More than a month ago   Pain Frequency Constant   Aggravating Factors  R shoulder movement, lifting, carrying looking to the L and R and side bending   Pain Relieving Factors heat, Deep tissue massage            [facility] PT Assessment - 05/18/16 1426      Assessment   Medical Diagnosis R sided neck pain    Referring Provider [name] MD   Onset Date/Surgical Date --  6 years   Hand Dominance Right   Next MD Visit --  05/20/2016   Prior Therapy yes     Precautions   Precautions None  Restrictions   Weight Bearing Restrictions No     Balance Screen   Has the patient fallen in the past 6 months Yes   How many times? 1   Has the patient had a decrease in activity level because of a fear of falling?  No   Is the patient reluctant to leave their home because of a fear of falling?  No     Home Environment   Living Environment Private residence   Living Arrangements Children;Spouse/significant other   Available Help at Discharge Available PRN/intermittently   Type of [place] to enter   Entrance Stairs-Number of Steps [place] One level     Prior Function   Level of Independence Independent;Independent with basic ADLs   Vocation Part time employment   Engineer, mining   Overall Cognitive Status Within Functional Limits for tasks assessed      ROM / Strength   AROM / PROM / Strength AROM;Strength     AROM   AROM Assessment Site Cervical   Cervical Flexion 60   Cervical Extension 25  pain at end range   Cervical - Right Side Bend 25  end range pain   Cervical - Left Side Bend 20  end range pain   Cervical - Right Rotation 36  end range   Cervical - Left Rotation 30  end range pain     Palpation   Palpation comment tightness in R upper trap/ levator scapulae with multiple trigger points palpable with referral to the occiput                   [facility] Adult PT Treatment/Exercise - 05/18/16 0001      Manual Therapy   Manual Therapy Soft tissue mobilization   Soft tissue mobilization IASTM along R upper trap and levator scapulae  reported decreased tension/ muscle tightness          Trigger Point Dry Needling - 05/18/16 1603    Consent Given? Yes   Education Handout Provided Yes   Muscles Treated Upper Body Upper trapezius   Upper Trapezius Response Twitch reponse elicited;Palpable increased muscle length  x 5 with pistoning technique              PT Education - 05/18/16 1518    Education provided Yes   Education Details evaluation findings, dry needling education, anatomy of the upper trapezius and surrounding muscle. [place] clinic handout, HEP with proper form and rationale   Person(s) Educated Patient   Methods Explanation;Demonstration;Handout;Verbal cues   Comprehension Verbalized understanding;Returned demonstration;Verbal cues required                    Plan - 05/18/16 1554    Clinical Impression Statement [name] presents to OPPT as a low complexity evaluation with CC of neck pain that has been present for 6 years. He demonstrates significant limitation in cervical mobility in all planes with ERP. Palpation reveals significant tightess and soft tissue restriction of the L upper trap / Levator scapulae tightness with multiple trigger points. Discussed with pt treatment and  performed DN in the R upper trap; pt was monitored throughout treatment and soft tissue mobilization which greatly improve pt's cervial rotation mobility bil. provided pt with HEP and [facility] clinic han[place]ut.    PT Frequency One time visit   PT Home Exercise Plan see pt instruction   Consulted and Agree with  Plan of Care Patient      Patient will benefit from skilled therapeutic intervention in order to improve the following deficits and impairments:  Pain, Improper body mechanics, Postural dysfunction, Increased muscle spasms, Increased fascial restricitons, Decreased range of motion, Hypomobility, Decreased mobility, Decreased endurance, Decreased activity tolerance  Visit Diagnosis: Cervicalgia - Plan: PT plan of care cert/re-cert  Other muscle spasm - Plan: PT plan of care cert/re-cert  Abnormal posture - Plan: PT plan of care cert/re-cert     Problem List Patient Active Problem List   Diagnosis Date Noted  . Conjunctivitis of left eye 04/15/2016  . Poor venous access 04/01/2016  . Epigastric pain 02/10/2016  . Gastritis without bleeding 01/22/2016  . Arrhythmia 12/25/2015  . Infection of eyelash follicle of left eye 03/49/1791  . Poor dentition 10/16/2015  . Other constipation 05/16/2015  . Neck pain on right side 01/28/2015  . 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 Acquired hypothyroidism 06/09/2014  . 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 Chronic fatigue 06/01/2014   Starr Lake PT, DPT, LAT, ATC  05/18/16  4:08 PM      [facility] [address] [place], [place], [address] Phone: [phone]   Fax:  [phone]  Name: Quindell Shere MRN: [id number] Date of Birth: [birth year]

## 2016-05-20 ENCOUNTER — Encounter: Payer: Self-pay | Admitting: Hematology and Oncology

## 2016-05-20 ENCOUNTER — Other Ambulatory Visit: Payer: Self-pay | Admitting: *Deleted

## 2016-05-20 ENCOUNTER — Telehealth: Payer: Self-pay | Admitting: *Deleted

## 2016-05-20 ENCOUNTER — Other Ambulatory Visit (HOSPITAL_BASED_OUTPATIENT_CLINIC_OR_DEPARTMENT_OTHER): Payer: Medicaid Other

## 2016-05-20 ENCOUNTER — Ambulatory Visit (HOSPITAL_BASED_OUTPATIENT_CLINIC_OR_DEPARTMENT_OTHER): Payer: Medicaid Other | Admitting: Hematology and Oncology

## 2016-05-20 ENCOUNTER — Ambulatory Visit: Payer: Medicaid Other

## 2016-05-20 ENCOUNTER — Ambulatory Visit (HOSPITAL_BASED_OUTPATIENT_CLINIC_OR_DEPARTMENT_OTHER): Payer: Medicaid Other

## 2016-05-20 VITALS — BP 108/63 | HR 82 | Temp 98.4°F | Resp 18 | Ht 67.75 in | Wt 161.1 lb

## 2016-05-20 DIAGNOSIS — C7800 Secondary malignant neoplasm of unspecified lung: Secondary | ICD-10-CM

## 2016-05-20 DIAGNOSIS — C760 Malignant neoplasm of head, face and neck: Secondary | ICD-10-CM

## 2016-05-20 DIAGNOSIS — Z5112 Encounter for antineoplastic immunotherapy: Secondary | ICD-10-CM

## 2016-05-20 DIAGNOSIS — C119 Malignant neoplasm of nasopharynx, unspecified: Secondary | ICD-10-CM

## 2016-05-20 DIAGNOSIS — M542 Cervicalgia: Secondary | ICD-10-CM | POA: Diagnosis not present

## 2016-05-20 LAB — CBC WITH DIFFERENTIAL/PLATELET
BASO%: 0.6 % (ref 0.0–2.0)
Basophils Absolute: 0 10*3/uL (ref 0.0–0.1)
EOS%: 2 % (ref 0.0–7.0)
Eosinophils Absolute: 0.1 10*3/uL (ref 0.0–0.5)
HEMATOCRIT: 44.3 % (ref 38.4–49.9)
HGB: 14.5 g/dL (ref 13.0–17.1)
LYMPH#: 1.4 10*3/uL (ref 0.9–3.3)
LYMPH%: 26.6 % (ref 14.0–49.0)
MCH: 29.2 pg (ref 27.2–33.4)
MCHC: 32.8 g/dL (ref 32.0–36.0)
MCV: 89.2 fL (ref 79.3–98.0)
MONO#: 0.4 10*3/uL (ref 0.1–0.9)
MONO%: 7.6 % (ref 0.0–14.0)
NEUT%: 63.2 % (ref 39.0–75.0)
NEUTROS ABS: 3.3 10*3/uL (ref 1.5–6.5)
PLATELETS: 193 10*3/uL (ref 140–400)
RBC: 4.97 10*6/uL (ref 4.20–5.82)
RDW: 13.8 % (ref 11.0–14.6)
WBC: 5.2 10*3/uL (ref 4.0–10.3)

## 2016-05-20 LAB — COMPREHENSIVE METABOLIC PANEL
ALT: 20 U/L (ref 0–55)
AST: 21 U/L (ref 5–34)
Albumin: 3.7 g/dL (ref 3.5–5.0)
Alkaline Phosphatase: 58 U/L (ref 40–150)
Anion Gap: 10 mEq/L (ref 3–11)
BILIRUBIN TOTAL: 0.36 mg/dL (ref 0.20–1.20)
BUN: 14.6 mg/dL (ref 7.0–26.0)
CALCIUM: 9.3 mg/dL (ref 8.4–10.4)
CHLORIDE: 108 meq/L (ref 98–109)
CO2: 24 meq/L (ref 22–29)
CREATININE: 0.9 mg/dL (ref 0.7–1.3)
EGFR: >90 mL/min/{1.73_m2} (ref >90)
Glucose: 145 mg/dl — ABNORMAL HIGH (ref 70–140)
Potassium: 3.7 mEq/L (ref 3.5–5.1)
Sodium: 142 mEq/L (ref 136–145)
TOTAL PROTEIN: 6.5 g/dL (ref 6.4–8.3)

## 2016-05-20 MED ORDER — SODIUM CHLORIDE 0.9 % IV SOLN
Freq: Once | INTRAVENOUS | Status: AC
  Administered 2016-05-20: 13:00:00 via INTRAVENOUS

## 2016-05-20 MED ORDER — SODIUM CHLORIDE 0.9 % IJ SOLN
10.0000 mL | INTRAMUSCULAR | Status: DC | PRN
  Administered 2016-05-20: 10 mL
  Filled 2016-05-20: qty 10

## 2016-05-20 MED ORDER — SODIUM CHLORIDE 0.9 % IJ SOLN
10.0000 mL | Freq: Once | INTRAMUSCULAR | Status: DC
  Administered 2016-05-20: 10 mL
  Filled 2016-05-20: qty 10

## 2016-05-20 MED ORDER — HEPARIN SOD (PORK) LOCK FLUSH 100 UNIT/ML IV SOLN
500.0000 [IU] | Freq: Once | INTRAVENOUS | Status: AC | PRN
  Administered 2016-05-20: 500 [IU]
  Filled 2016-05-20: qty 5

## 2016-05-20 MED ORDER — SODIUM CHLORIDE 0.9 % IV SOLN
240.0000 mg | Freq: Once | INTRAVENOUS | Status: AC
  Administered 2016-05-20: 240 mg via INTRAVENOUS
  Filled 2016-05-20: qty 20

## 2016-05-20 NOTE — Assessment & Plan Note (Signed)
He is not symptomatic. Observe only 

## 2016-05-20 NOTE — Progress Notes (Signed)
Received follow up email from [facility] that patient can apply for FA, would just need to note limited MCD coverage on the application. I called patient to advise him to go ahead and complete application and bring supporting documents to his next appointment so that I may submit application for him. Patient verbalized understanding and thanked me.

## 2016-05-20 NOTE — Progress Notes (Signed)
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 Pan-endoscopy elsewhere showed cancer from nasopharyngeal space.     01/04/2007 - 02/20/2007 Chemotherapy    He received 2 cycles of cisplatin and 5FU followed by concurrent chemo with weekly cisplatin and radiation. He only received 2 doses of chemo due to severe mucositis, nausea and weight loss.     04/05/2007 - 08/04/2007 Chemotherapy    He received 4 more courses of cisplatin with 5FU and had complete response     07/05/2009 Procedure    Fine-needle aspirate of the right level II lymph nodes come from recurrent metastatic disease. Repeat endoscopy and CT scan show no evidence of disease elsewhere.     07/08/2009 - 12/02/2009 Chemotherapy    He was given 6 cycles of carboplatin, 5-FU and docetaxel     12/03/2009 Surgery    He has surgery to the residual lymph node on the right neck which showed no evidence of disease.     02/22/2012 Imaging    Repeat imaging study showed large recurrent mass. He was referred elsewhere  for further treatment.     05/03/2012 Surgery    He underwent left upper lobectomy.     04/29/2013 Imaging    PEt scan showed lesion on right level II B and lower lung was abnormal     06/03/2013 - 02/02/2014 Chemotherapy    He had 6 cycles of chemotherapy when he was found to have recurrence of cancer and had received oxaliplatin and capecitabine     06/07/2014 Imaging    PET CT scan showed persistent disease in the right neck lymph nodes and left lung     06/29/2014 Procedure    Accession: [id number] repeat LUL biopsy confirmed metastatic cancer     07/18/2014 - 07/31/2014 Radiation Therapy    He received palliative radiation therapy to the lungs     10/10/2014 Imaging    CT scan of the chest, abdomen and pelvis show regression in the size of the lung nodule in the left upper lobe and stable pulmonary nodules     01/24/2015 Imaging    CT scan showed stable disease in neck and lung     06/19/2015 Imaging    CT scan of the neck and the chest show possible mild progression of the nodule in the right side of the neck.     06/25/2015 Imaging    PET scan confirmed disease recurrence in the neck     07/07/2015 Imaging    [facility]     09/03/2015 -  Chemotherapy    He received palliative chemo with Nivolumab     10/29/2015 Imaging    PET CT showed positive response to Rx     02/28/2016 Imaging    Ct abdomen showed abnormal thinkening in his stomach     03/03/2016 Imaging    CT: Right sternocleidomastoid muscle metastasis appears less distinct but otherwise not significantly changed in size or configuration since 06/19/2015.2. Left level 3 lymph node which was hypermetabolic by PET-CT in January 2017 appears slightly smaller     04/01/2016 Imaging    CT cervical spine showed no acute fracture or traumatic malalignment in the cervical spine     04/22/2016 Procedure    Port-a-cath placed.      INTERVAL HISTORY: Please see below for problem oriented charting. He returns for  follow-up. He tolerated treatment well. Denies recent infection. His prior epigastric pain and nausea had resolved. He attended 1 physical therapy session for his neck pain and he found that very helpful. He denies any need for pain medicine recently  REVIEW OF SYSTEMS:   Constitutional: Denies fevers, chills or abnormal weight loss Eyes: Denies blurriness of vision Ears, nose, mouth, throat, and face: Denies mucositis or sore throat Respiratory: Denies cough, dyspnea or wheezes Cardiovascular: Denies palpitation, chest discomfort or lower extremity swelling Gastrointestinal:  Denies nausea, heartburn or change in bowel habits Skin: Denies abnormal skin rashes Lymphatics: Denies new lymphadenopathy or easy bruising Neurological:Denies numbness, tingling or new weaknesses Behavioral/Psych: Mood is stable, no new changes  All other systems were reviewed with the patient and are negative.  I have reviewed the past medical history, past surgical history, social history and family history with the patient and they are unchanged from previous note.  ALLERGIES:  has No Known Allergies.  MEDICATIONS:  Current Outpatient Prescriptions  Medication Sig Dispense Refill  . levothyroxine (SYNTHROID, LEVOTHROID) 75 MCG tablet TAKE 1 TABLET BY MOUTH EVERY DAY BEFORE BREAKFAST (Patient taking differently: TAKE 75 MCG BY MOUTH EVERY DAY BEFORE BREAKFAST) 90 tablet 0  . lidocaine-prilocaine (EMLA) cream Apply 1 application topically as needed. 30 g 3  . omeprazole (PRILOSEC) 20 MG capsule TAKE ONE CAPSULE BY MOUTH TWICE DAILY BEFORE A MEAL (Patient not taking: Reported on 05/18/2016) 600 capsule 1  . ondansetron (ZOFRAN) 8 MG tablet Take 1 tablet (8 mg total) by mouth every 8 (eight) hours as needed for nausea or vomiting. (Patient not taking: Reported on 05/18/2016) 90 tablet 1  . oxyCODONE (ROXICODONE) 15 MG immediate release tablet Take 7.5-15 mg by mouth every 4 (four) hours as needed for pain.    Marland Kitchen  tobramycin (TOBREX) 0.3 % ophthalmic solution Place 1 drop into the left eye every 4 (four) hours. (Patient not taking: Reported on 05/18/2016) 5 mL 0   No current facility-administered medications for this visit.    Facility-Administered Medications Ordered in Other Visits  Medication Dose Route Frequency Provider Last Rate Last Dose  . 0.9 %  sodium chloride infusion   Intravenous Once [name], MD      . heparin lock flush 100 unit/mL  500 Units Intracatheter Once PRN [name], MD      . sodium chloride 0.9 % injection 10 mL  10 mL Intracatheter PRN Kalesha Irving, MD      . sodium chloride 0.9 % injection 10 mL  10 mL Intracatheter PRN [name], MD        PHYSICAL EXAMINATION: ECOG PERFORMANCE STATUS: 0 - Asymptomatic  Vitals:   05/20/16 1123  BP: 108/63  Pulse: 82  Resp: 18  Temp: 98.4 F (36.9 C)   Filed Weights   05/20/16 1123  Weight: 161 lb 1.6 oz (73.1 kg)    GENERAL:alert, no distress and comfortable SKIN: skin color, texture, turgor are normal, no rashes or significant lesions EYES: normal, Conjunctiva are pink and non-injected, sclera clear OROPHARYNX:no exudate, no erythema and lips, buccal mucosa, and tongue normal  NECK: Significant neck fibrosis. No abnormalities on exam apart from prior scar and signs of fibrosis  LYMPH:  no palpable lymphadenopathy in the cervical, axillary or inguinal LUNGS: clear to auscultation and percussion with normal breathing effort HEART: regular rate & rhythm and no murmurs and no lower extremity edema ABDOMEN:abdomen soft, non-tender and normal bowel sounds Musculoskeletal:no cyanosis of digits and no clubbing  NEURO: alert & oriented x 3 with fluent speech, no focal motor/sensory deficits  LABORATORY DATA:  I have reviewed the data as listed    Component Value Date/Time   NA 142 05/20/2016 1051   K 3.7 05/20/2016 1051   CL 104 06/29/2014 0655   CO2 24 05/20/2016 1051   GLUCOSE 145 (H) 05/20/2016 1051   BUN 14.6  05/20/2016 1051   CREATININE 0.9 05/20/2016 1051   CALCIUM 9.3 05/20/2016 1051   PROT 6.5 05/20/2016 1051   ALBUMIN 3.7 05/20/2016 1051   AST 21 05/20/2016 1051   ALT 20 05/20/2016 1051   ALKPHOS 58 05/20/2016 1051   BILITOT 0.36 05/20/2016 1051   GFRNONAA >90 06/29/2014 0655   GFRAA >90 06/29/2014 0655    No results found for: SPEP, UPEP  Lab Results  Component Value Date   WBC 5.2 05/20/2016   NEUTROABS 3.3 05/20/2016   HGB 14.5 05/20/2016   HCT 44.3 05/20/2016   MCV 89.2 05/20/2016   PLT 193 05/20/2016      Chemistry      Component Value Date/Time   NA 142 05/20/2016 1051   K 3.7 05/20/2016 1051   CL 104 06/29/2014 0655   CO2 24 05/20/2016 1051   BUN 14.6 05/20/2016 1051   CREATININE 0.9 05/20/2016 1051      Component Value Date/Time   CALCIUM 9.3 05/20/2016 1051   ALKPHOS 58 05/20/2016 1051   AST 21 05/20/2016 1051   ALT 20 05/20/2016 1051   BILITOT 0.36 05/20/2016 1051    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[place]) He had excellent response to treatment. CT scan in May 2017 show excellent response to treatment and last PET CT scan show he has achieved complete response. 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 Observe only    Neck pain on right side The patient has significant fibrosis and scar tissue from prior surgery and radiation. I recommend continue conservative management with physical therapy, topical lidocaine and pain medicine as needed I will send another physical therapy consultation and alerted the navigator about the appointment    Orders Placed This Encounter  Procedures  . CT CHEST W CONTRAST    Standing Status:   Future    Standing Expiration Date:   06/24/2017    Order Specific Question:   Reason for exam:    Answer:   staging primary head and neck cancer, assess  response to Rx    Order Specific Question:  Preferred imaging location?    Answer:   [facility]  . CT ABDOMEN PELVIS W CONTRAST    Standing Status:   Future    Standing Expiration Date:   06/24/2017    Order Specific Question:   Reason for exam:    Answer:   staging primary head and neck cancer, assess response to Rx    Order Specific Question:   Preferred imaging location?    Answer:   [facility]   All questions were answered. The patient knows to call the clinic with any problems, questions or concerns. No barriers to learning was detected. I spent 20 minutes counseling the patient face to face. The total time spent in the appointment was 25 minutes and more than 50% was on counseling and review of test results     [facility], [place], MD 05/20/2016 2:36 PM

## 2016-05-20 NOTE — Progress Notes (Signed)
Patient referred by Rick(Navigator) for Financial Assistance. Asked patient what he needed assistance with. Patient states he has Medicaid which will only cover one PT visit per year and needed assistance paying. Being that patient is covered, he will more than likely not be eligible to apply for [facility] FA but gave him the application and advised I would follow up with Larene Beach the manger to confirm. I will then contact the patient to advise him on the follow up if he can use the application or not. If not advised patient he would need to contact the facility which he would receive services to ask about assistance or payment options that may be available there. Discussed [place] with patient and advised I would need proof of income for himself and spouse. His household size is 4, 2 of which are minor children. Patient has my card with my email address which he may email the documents and at his next visit we can proceed with the grant application. Patient has my name and number for any additional financial questions or concerns.

## 2016-05-20 NOTE — Telephone Encounter (Signed)
  Oncology Nurse Navigator Documentation  LVMM for Mr. Mcdill indicating he has an 11:30 AM appt with Financial Advocate Stefanie Libel following his 11:00 AM appt with Dr. Alvy Bimler.  Gayleen Orem, RN, BSN, [place] at [facility] [phone]   Navigator Location: [place] (05/20/16 0916) Navigator Encounter Type: Telephone (05/20/16 3570) Telephone: Jerilee Hoh Confirmation/Clarification;Financial Assistance (05/20/16 1779)                                        Time Spent with Patient: 15 (05/20/16 3903)

## 2016-05-20 NOTE — Patient Instructions (Signed)
[facility] Discharge Instructions for Patients Receiving Chemotherapy  Today you received the following chemotherapy agents:  Opdivo  To help prevent nausea and vomiting after your treatment, we encourage you to take your nausea medication as prescribed.   If you develop nausea and vomiting that is not controlled by your nausea medication, call the clinic.   BELOW ARE SYMPTOMS THAT SHOULD BE REPORTED IMMEDIATELY:  *FEVER GREATER THAN 100.5 F  *CHILLS WITH OR WITHOUT FEVER  NAUSEA AND VOMITING THAT IS NOT CONTROLLED WITH YOUR NAUSEA MEDICATION  *UNUSUAL SHORTNESS OF BREATH  *UNUSUAL BRUISING OR BLEEDING  TENDERNESS IN MOUTH AND THROAT WITH OR WITHOUT PRESENCE OF ULCERS  *URINARY PROBLEMS  *BOWEL PROBLEMS  UNUSUAL RASH Items with * indicate a potential emergency and should be followed up as soon as possible.  Feel free to call the clinic you have any questions or concerns. The clinic phone number is [phone].  Please show the CHEMO ALERT CARD at check-in to the Emergency Department and triage nurse.   

## 2016-05-20 NOTE — Assessment & Plan Note (Signed)
He had excellent response to treatment. CT scan in May 2017 show excellent response to treatment and last PET CT scan show he has achieved complete response. We will continue Nivolumab every 2 weeks and I plan to order a CT scan of the neck and chest with IV contrast before I see him back next month

## 2016-05-20 NOTE — Assessment & Plan Note (Signed)
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 I recommend continue conservative management with physical therapy, topical lidocaine and pain medicine as needed I will send another physical therapy consultation and alerted the navigator about the appointment

## 2016-05-25 ENCOUNTER — Telehealth: Payer: Self-pay | Admitting: Hematology and Oncology

## 2016-05-25 NOTE — Telephone Encounter (Signed)
Appointment dates conf with patient. 05/25/16

## 2016-06-02 ENCOUNTER — Other Ambulatory Visit: Payer: Self-pay | Admitting: Hematology and Oncology

## 2016-06-02 DIAGNOSIS — C760 Malignant neoplasm of head, face and neck: Secondary | ICD-10-CM

## 2016-06-03 ENCOUNTER — Other Ambulatory Visit (HOSPITAL_BASED_OUTPATIENT_CLINIC_OR_DEPARTMENT_OTHER): Payer: Medicaid Other

## 2016-06-03 ENCOUNTER — Ambulatory Visit (HOSPITAL_BASED_OUTPATIENT_CLINIC_OR_DEPARTMENT_OTHER): Payer: Medicaid Other

## 2016-06-03 ENCOUNTER — Ambulatory Visit: Payer: Medicaid Other

## 2016-06-03 VITALS — BP 117/70 | HR 73 | Temp 98.5°F | Resp 16

## 2016-06-03 VITALS — BP 108/67 | HR 81 | Temp 97.6°F | Resp 18

## 2016-06-03 DIAGNOSIS — C760 Malignant neoplasm of head, face and neck: Secondary | ICD-10-CM | POA: Diagnosis not present

## 2016-06-03 DIAGNOSIS — E039 Hypothyroidism, unspecified: Secondary | ICD-10-CM

## 2016-06-03 DIAGNOSIS — C7800 Secondary malignant neoplasm of unspecified lung: Secondary | ICD-10-CM

## 2016-06-03 DIAGNOSIS — Z5112 Encounter for antineoplastic immunotherapy: Secondary | ICD-10-CM

## 2016-06-03 DIAGNOSIS — I878 Other specified disorders of veins: Secondary | ICD-10-CM

## 2016-06-03 LAB — CBC WITH DIFFERENTIAL/PLATELET
BASO%: 0.8 % (ref 0.0–2.0)
BASOS ABS: 0 10*3/uL (ref 0.0–0.1)
EOS ABS: 0.1 10*3/uL (ref 0.0–0.5)
EOS%: 0.9 % (ref 0.0–7.0)
HEMATOCRIT: 45.4 % (ref 38.4–49.9)
HGB: 15.1 g/dL (ref 13.0–17.1)
LYMPH#: 1.5 10*3/uL (ref 0.9–3.3)
LYMPH%: 22 % (ref 14.0–49.0)
MCH: 29.2 pg (ref 27.2–33.4)
MCHC: 33.2 g/dL (ref 32.0–36.0)
MCV: 87.9 fL (ref 79.3–98.0)
MONO#: 0.6 10*3/uL (ref 0.1–0.9)
MONO%: 8.3 % (ref 0.0–14.0)
NEUT#: 4.5 10*3/uL (ref 1.5–6.5)
NEUT%: 68 % (ref 39.0–75.0)
PLATELETS: 232 10*3/uL (ref 140–400)
RBC: 5.17 10*6/uL (ref 4.20–5.82)
RDW: 13.6 % (ref 11.0–14.6)
WBC: 6.6 10*3/uL (ref 4.0–10.3)

## 2016-06-03 LAB — COMPREHENSIVE METABOLIC PANEL
ALT: 16 U/L (ref 0–55)
ANION GAP: 10 meq/L (ref 3–11)
AST: 22 U/L (ref 5–34)
Albumin: 4.2 g/dL (ref 3.5–5.0)
Alkaline Phosphatase: 62 U/L (ref 40–150)
BILIRUBIN TOTAL: 0.55 mg/dL (ref 0.20–1.20)
BUN: 13.1 mg/dL (ref 7.0–26.0)
CALCIUM: 9.6 mg/dL (ref 8.4–10.4)
CHLORIDE: 105 meq/L (ref 98–109)
CO2: 24 meq/L (ref 22–29)
CREATININE: 0.9 mg/dL (ref 0.7–1.3)
Glucose: 91 mg/dl (ref 70–140)
Potassium: 3.9 mEq/L (ref 3.5–5.1)
Sodium: 139 mEq/L (ref 136–145)
Total Protein: 7.1 g/dL (ref 6.4–8.3)

## 2016-06-03 LAB — TSH: TSH: 3.057 m(IU)/L (ref 0.320–4.118)

## 2016-06-03 MED ORDER — ANTICOAGULANT SODIUM CITRATE 4% (200MG/5ML) IV SOLN
5.0000 mL | Status: AC | PRN
  Administered 2016-06-03: 5 mL
  Filled 2016-06-03: qty 5

## 2016-06-03 MED ORDER — SODIUM CHLORIDE 0.9 % IV SOLN
Freq: Once | INTRAVENOUS | Status: AC
  Administered 2016-06-03: 14:00:00 via INTRAVENOUS

## 2016-06-03 MED ORDER — SODIUM CHLORIDE 0.9 % IJ SOLN
10.0000 mL | INTRAMUSCULAR | Status: DC | PRN
  Filled 2016-06-03: qty 10

## 2016-06-03 MED ORDER — SODIUM CHLORIDE 0.9 % IV SOLN
240.0000 mg | Freq: Once | INTRAVENOUS | Status: AC
  Administered 2016-06-03: 240 mg via INTRAVENOUS
  Filled 2016-06-03: qty 20

## 2016-06-03 MED ORDER — SODIUM CHLORIDE 0.9% FLUSH
10.0000 mL | INTRAVENOUS | Status: DC | PRN
  Administered 2016-06-03 (×2): 10 mL via INTRAVENOUS
  Filled 2016-06-03: qty 10

## 2016-06-03 MED ORDER — HEPARIN SOD (PORK) LOCK FLUSH 100 UNIT/ML IV SOLN
500.0000 [IU] | Freq: Once | INTRAVENOUS | Status: DC | PRN
  Filled 2016-06-03: qty 5

## 2016-06-03 NOTE — Patient Instructions (Signed)

## 2016-06-03 NOTE — Patient Instructions (Signed)
[facility] Discharge Instructions for Patients Receiving Chemotherapy  Today you received the following chemotherapy agents:  Opdivo  To help prevent nausea and vomiting after your treatment, we encourage you to take your nausea medication as prescribed.   If you develop nausea and vomiting that is not controlled by your nausea medication, call the clinic.   BELOW ARE SYMPTOMS THAT SHOULD BE REPORTED IMMEDIATELY:  *FEVER GREATER THAN 100.5 F  *CHILLS WITH OR WITHOUT FEVER  NAUSEA AND VOMITING THAT IS NOT CONTROLLED WITH YOUR NAUSEA MEDICATION  *UNUSUAL SHORTNESS OF BREATH  *UNUSUAL BRUISING OR BLEEDING  TENDERNESS IN MOUTH AND THROAT WITH OR WITHOUT PRESENCE OF ULCERS  *URINARY PROBLEMS  *BOWEL PROBLEMS  UNUSUAL RASH Items with * indicate a potential emergency and should be followed up as soon as possible.  Feel free to call the clinic you have any questions or concerns. The clinic phone number is [phone].  Please show the CHEMO ALERT CARD at check-in to the Emergency Department and triage nurse.   

## 2016-06-12 ENCOUNTER — Other Ambulatory Visit: Payer: Self-pay | Admitting: Hematology and Oncology

## 2016-06-12 DIAGNOSIS — C760 Malignant neoplasm of head, face and neck: Secondary | ICD-10-CM

## 2016-06-15 ENCOUNTER — Telehealth: Payer: Self-pay | Admitting: *Deleted

## 2016-06-15 NOTE — Telephone Encounter (Addendum)
Oncology Nurse Navigator Documentation  Scheduled CT Neck w/ Contrast and CT Chest w/ Contrast with Christian Simmons, Radiology Scheduling.  Spoke with Christian Simmons, informed him of 2:00 CT scans scheduled for tomorrow at [facility] Radiology, NPO status after 10:00; and rescheduling of lab from 11:45 to 1:30.  He voiced understanding.  Gayleen Orem, RN, BSN, [place] at [place] [phone]

## 2016-06-16 ENCOUNTER — Ambulatory Visit (HOSPITAL_COMMUNITY)
Admission: RE | Admit: 2016-06-16 | Discharge: 2016-06-16 | Disposition: A | Discharge status: Home / Self Care | Payer: Medicaid Other | Source: Ambulatory Visit | Attending: Hematology and Oncology | Admitting: Hematology and Oncology

## 2016-06-16 ENCOUNTER — Other Ambulatory Visit (HOSPITAL_BASED_OUTPATIENT_CLINIC_OR_DEPARTMENT_OTHER): Payer: Medicaid Other

## 2016-06-16 ENCOUNTER — Encounter (HOSPITAL_COMMUNITY): Payer: Self-pay

## 2016-06-16 ENCOUNTER — Other Ambulatory Visit: Payer: Medicaid Other

## 2016-06-16 DIAGNOSIS — C760 Malignant neoplasm of head, face and neck: Secondary | ICD-10-CM | POA: Diagnosis present

## 2016-06-16 LAB — CBC WITH DIFFERENTIAL/PLATELET
BASO%: 0.2 % (ref 0.0–2.0)
BASOS ABS: 0 10*3/uL (ref 0.0–0.1)
EOS ABS: 0.1 10*3/uL (ref 0.0–0.5)
EOS%: 1.7 % (ref 0.0–7.0)
HEMATOCRIT: 42.9 % (ref 38.4–49.9)
HEMOGLOBIN: 14.6 g/dL (ref 13.0–17.1)
LYMPH#: 1.3 10*3/uL (ref 0.9–3.3)
LYMPH%: 24.8 % (ref 14.0–49.0)
MCH: 29.9 pg (ref 27.2–33.4)
MCHC: 34 g/dL (ref 32.0–36.0)
MCV: 87.7 fL (ref 79.3–98.0)
MONO#: 0.4 10*3/uL (ref 0.1–0.9)
MONO%: 7.4 % (ref 0.0–14.0)
NEUT#: 3.5 10*3/uL (ref 1.5–6.5)
NEUT%: 65.9 % (ref 39.0–75.0)
PLATELETS: 207 10*3/uL (ref 140–400)
RBC: 4.89 10*6/uL (ref 4.20–5.82)
RDW: 13.2 % (ref 11.0–14.6)
WBC: 5.2 10*3/uL (ref 4.0–10.3)

## 2016-06-16 LAB — COMPREHENSIVE METABOLIC PANEL
ALBUMIN: 4.2 g/dL (ref 3.5–5.0)
ALK PHOS: 61 U/L (ref 40–150)
ALT: 23 U/L (ref 0–55)
AST: 22 U/L (ref 5–34)
Anion Gap: 9 mEq/L (ref 3–11)
BILIRUBIN TOTAL: 0.33 mg/dL (ref 0.20–1.20)
BUN: 13.7 mg/dL (ref 7.0–26.0)
CALCIUM: 9.9 mg/dL (ref 8.4–10.4)
CO2: 28 mEq/L (ref 22–29)
Chloride: 106 mEq/L (ref 98–109)
Creatinine: 1.2 mg/dL (ref 0.7–1.3)
GLUCOSE: 86 mg/dL (ref 70–140)
Potassium: 4.4 mEq/L (ref 3.5–5.1)
SODIUM: 142 meq/L (ref 136–145)
TOTAL PROTEIN: 7.3 g/dL (ref 6.4–8.3)

## 2016-06-16 MED ORDER — IOPAMIDOL (ISOVUE-300) INJECTION 61%
75.0000 mL | Freq: Once | INTRAVENOUS | Status: AC | PRN
  Administered 2016-06-16: 75 mL via INTRAVENOUS

## 2016-06-17 ENCOUNTER — Encounter: Payer: Self-pay | Admitting: Hematology and Oncology

## 2016-06-17 ENCOUNTER — Ambulatory Visit (HOSPITAL_BASED_OUTPATIENT_CLINIC_OR_DEPARTMENT_OTHER): Payer: Medicaid Other | Admitting: Hematology and Oncology

## 2016-06-17 ENCOUNTER — Telehealth: Payer: Self-pay | Admitting: Hematology and Oncology

## 2016-06-17 ENCOUNTER — Ambulatory Visit (HOSPITAL_BASED_OUTPATIENT_CLINIC_OR_DEPARTMENT_OTHER): Payer: Medicaid Other

## 2016-06-17 DIAGNOSIS — C7800 Secondary malignant neoplasm of unspecified lung: Secondary | ICD-10-CM

## 2016-06-17 DIAGNOSIS — Z23 Encounter for immunization: Secondary | ICD-10-CM | POA: Diagnosis not present

## 2016-06-17 DIAGNOSIS — M542 Cervicalgia: Secondary | ICD-10-CM

## 2016-06-17 DIAGNOSIS — C760 Malignant neoplasm of head, face and neck: Secondary | ICD-10-CM

## 2016-06-17 DIAGNOSIS — Z5111 Encounter for antineoplastic chemotherapy: Secondary | ICD-10-CM | POA: Diagnosis not present

## 2016-06-17 MED ORDER — INFLUENZA VAC SPLIT QUAD 0.5 ML IM SUSY
0.5000 mL | PREFILLED_SYRINGE | Freq: Once | INTRAMUSCULAR | Status: AC
  Administered 2016-06-17: 0.5 mL via INTRAMUSCULAR
  Filled 2016-06-17: qty 0.5

## 2016-06-17 MED ORDER — SODIUM CHLORIDE 0.9 % IV SOLN: Freq: Once | INTRAVENOUS | Status: DC

## 2016-06-17 MED ORDER — ANTICOAGULANT SODIUM CITRATE 4% (200MG/5ML) IV SOLN
5.0000 mL | Status: DC | PRN
  Administered 2016-06-17: 5 mL via INTRAVENOUS
  Filled 2016-06-17: qty 5

## 2016-06-17 MED ORDER — SODIUM CHLORIDE 0.9 % IJ SOLN
10.0000 mL | INTRAMUSCULAR | Status: DC | PRN
  Administered 2016-06-17: 10 mL
  Filled 2016-06-17: qty 10

## 2016-06-17 MED ORDER — SODIUM CHLORIDE 0.9 % IV SOLN
240.0000 mg | Freq: Once | INTRAVENOUS | Status: AC
  Administered 2016-06-17: 240 mg via INTRAVENOUS
  Filled 2016-06-17: qty 20

## 2016-06-17 NOTE — Assessment & Plan Note (Signed)
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 I recommend continue conservative management with physical therapy, topical lidocaine and pain medicine as needed 

## 2016-06-17 NOTE — Progress Notes (Signed)
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 Pan-endoscopy elsewhere showed cancer from nasopharyngeal space.      01/04/2007 - 02/20/2007 Chemotherapy    He received 2 cycles of cisplatin and 5FU followed by concurrent chemo with weekly cisplatin and radiation. He only received 2 doses of chemo due to severe mucositis, nausea and weight loss.      04/05/2007 - 08/04/2007 Chemotherapy    He received 4 more courses of cisplatin with 5FU and had complete response      07/05/2009 Procedure    Fine-needle aspirate of the right level II lymph nodes come from recurrent metastatic disease. Repeat endoscopy and CT scan show no evidence of disease elsewhere.      07/08/2009 - 12/02/2009 Chemotherapy    He was given 6 cycles of carboplatin, 5-FU and docetaxel      12/03/2009 Surgery    He has surgery to the residual lymph node on the right neck which showed no evidence of disease.      02/22/2012 Imaging    Repeat imaging study showed large recurrent mass. He was referred  elsewhere for further treatment.      05/03/2012 Surgery    He underwent left upper lobectomy.      04/29/2013 Imaging    PEt scan showed lesion on right level II B and lower lung was abnormal      06/03/2013 - 02/02/2014 Chemotherapy    He had 6 cycles of chemotherapy when he was found to have recurrence of cancer and had received oxaliplatin and capecitabine      06/07/2014 Imaging    PET CT scan showed persistent disease in the right neck lymph nodes and left lung      06/29/2014 Procedure    Accession: [id number] repeat LUL biopsy confirmed metastatic cancer      07/18/2014 - 07/31/2014 Radiation Therapy    He received palliative radiation therapy to the lungs      10/10/2014 Imaging    CT scan of the chest, abdomen and pelvis show regression in the size of the lung nodule in the left upper lobe and stable pulmonary nodules      01/24/2015 Imaging    CT scan showed stable disease in neck and lung      06/19/2015 Imaging    CT scan of the neck and the chest show possible mild progression of the nodule in the right side of the neck.      06/25/2015 Imaging    PET scan confirmed disease recurrence in the neck  07/07/2015 Imaging    He had MRI neck at [facility]      09/03/2015 -  Chemotherapy    He received palliative chemo with Nivolumab      10/29/2015 Imaging    PET CT showed positive response to Rx      02/28/2016 Imaging    Ct abdomen showed abnormal thinkening in his stomach      03/03/2016 Imaging    CT: Right sternocleidomastoid muscle metastasis appears less distinct but otherwise not significantly changed in size or configuration since 06/19/2015.2. Left level 3 lymph node which was hypermetabolic by PET-CT in January 2017 appears slightly smaller      04/01/2016 Imaging    CT cervical spine showed no acute fracture or traumatic malalignment in the cervical spine      04/22/2016 Procedure    Port-a-cath placed.      06/16/2016 Imaging    Ct neck showed  right sternocleidomastoid muscle metastasis is further decreased in conspicuity since May, and has mildly decreased in size since September 2016. Continued stability of sub-centimeter left cervical lymph nodes. No new or progressive metastatic disease in the neck.      06/16/2016 Imaging    CT chest showed stable masslike radiation fibrosis in the left upper lobe. Stable subcentimeter pulmonary nodules in the bilateral lower lobes. No new or progressive metastatic disease in the chest. Nonobstructing left renal stone.       INTERVAL HISTORY: Please see below for problem oriented charting. He returns for follow-up. He denies worsening neck pain. Recent side effects from treatment. Denies recent chest pain or shortness of breath.  REVIEW OF SYSTEMS:   Constitutional: Denies fevers, chills or abnormal weight loss Eyes: Denies blurriness of vision Ears, nose, mouth, throat, and face: Denies mucositis or sore throat Respiratory: Denies cough, dyspnea or wheezes Cardiovascular: Denies palpitation, chest discomfort or lower extremity swelling Gastrointestinal:  Denies nausea, heartburn or change in bowel habits Skin: Denies abnormal skin rashes Lymphatics: Denies new lymphadenopathy or easy bruising Neurological:Denies numbness, tingling or new weaknesses Behavioral/Psych: Mood is stable, no new changes  All other systems were reviewed with the patient and are negative.  I have reviewed the past medical history, past surgical history, social history and family history with the patient and they are unchanged from previous note.  ALLERGIES:  is allergic to heparin.  MEDICATIONS:  Current Outpatient Prescriptions  Medication Sig Dispense Refill  . levothyroxine (SYNTHROID, LEVOTHROID) 75 MCG tablet TAKE 1 TABLET BY MOUTH EVERY DAY BEFORE BREAKFAST (Patient taking differently: TAKE 75 MCG BY MOUTH EVERY DAY BEFORE BREAKFAST) 90 tablet 0  . lidocaine-prilocaine (EMLA) cream Apply 1  application topically as needed. 30 g 3  . omeprazole (PRILOSEC) 20 MG capsule TAKE ONE CAPSULE BY MOUTH TWICE DAILY BEFORE A MEAL (Patient not taking: Reported on 05/18/2016) 600 capsule 1  . ondansetron (ZOFRAN) 8 MG tablet Take 1 tablet (8 mg total) by mouth every 8 (eight) hours as needed for nausea or vomiting. (Patient not taking: Reported on 05/18/2016) 90 tablet 1  . oxyCODONE (ROXICODONE) 15 MG immediate release tablet Take 7.5-15 mg by mouth every 4 (four) hours as needed for pain.    Marland Kitchen tobramycin (TOBREX) 0.3 % ophthalmic solution Place 1 drop into the left eye every 4 (four) hours. (Patient not taking: Reported on 05/18/2016) 5 mL 0   No current facility-administered medications for this visit.    Facility-Administered Medications Ordered in Other Visits  Medication Dose Route Frequency Provider Last Rate Last Dose  .  sodium chloride 0.9 % injection 10 mL  10 mL Intracatheter PRN [name], MD        PHYSICAL EXAMINATION: ECOG PERFORMANCE STATUS: 0 - Asymptomatic  Vitals:   06/17/16 1139  BP: 105/68  Pulse: 71  Resp: 18  Temp: 98 F (36.7 C)   Filed Weights   06/17/16 1139  Weight: 160 lb 8 oz (72.8 kg)    GENERAL:alert, no distress and comfortable SKIN: skin color, texture, turgor are normal, no rashes or significant lesions EYES: normal, Conjunctiva are pink and non-injected, sclera clear OROPHARYNX:no exudate, no erythema and lips, buccal mucosa, and tongue normal  NECK: supple, thyroid normal size, non-tender, without nodularity LYMPH:  no palpable lymphadenopathy in the cervical, axillary or inguinal LUNGS: clear to auscultation and percussion with normal breathing effort HEART: regular rate & rhythm and no murmurs and no lower extremity edema ABDOMEN:abdomen soft, non-tender and normal bowel sounds Musculoskeletal:no cyanosis of digits and no clubbing  NEURO: alert & oriented x 3 with fluent speech, no focal motor/sensory deficits  LABORATORY DATA:  I have  reviewed the data as listed    Component Value Date/Time   NA 142 06/16/2016 1344   K 4.4 06/16/2016 1344   CL 104 06/29/2014 0655   CO2 28 06/16/2016 1344   GLUCOSE 86 06/16/2016 1344   BUN 13.7 06/16/2016 1344   CREATININE 1.2 06/16/2016 1344   CALCIUM 9.9 06/16/2016 1344   PROT 7.3 06/16/2016 1344   ALBUMIN 4.2 06/16/2016 1344   AST 22 06/16/2016 1344   ALT 23 06/16/2016 1344   ALKPHOS 61 06/16/2016 1344   BILITOT 0.33 06/16/2016 1344   GFRNONAA >90 06/29/2014 0655   GFRAA >90 06/29/2014 0655    No results found for: SPEP, UPEP  Lab Results  Component Value Date   WBC 5.2 06/16/2016   NEUTROABS 3.5 06/16/2016   HGB 14.6 06/16/2016   HCT 42.9 06/16/2016   MCV 87.7 06/16/2016   PLT 207 06/16/2016      Chemistry      Component Value Date/Time   NA 142 06/16/2016 1344   K 4.4 06/16/2016 1344   CL 104 06/29/2014 0655   CO2 28 06/16/2016 1344   BUN 13.7 06/16/2016 1344   CREATININE 1.2 06/16/2016 1344      Component Value Date/Time   CALCIUM 9.9 06/16/2016 1344   ALKPHOS 61 06/16/2016 1344   AST 22 06/16/2016 1344   ALT 23 06/16/2016 1344   BILITOT 0.33 06/16/2016 1344       RADIOGRAPHIC STUDIES: I have personally reviewed the radiological images as listed and agreed with the findings in the report. Ct Soft Tissue Neck W Contrast  Result Date: 06/16/2016 CLINICAL DATA:  30 year old male with metastatic nasopharyngeal carcinoma. Previous surgery chemotherapy and radiation. Ongoing immunotherapy. Restaging. Subsequent encounter. EXAM: CT NECK WITH CONTRAST TECHNIQUE: Multidetector CT imaging of the neck was performed using the standard protocol following the bolus administration of intravenous contrast. CONTRAST:  42m ISOVUE-300 IOPAMIDOL (ISOVUE-300) INJECTION 61% in conjunction with contrast enhanced imaging of the chest reported separately. COMPARISON:  Neck CT 03/03/2016, and earlier FINDINGS: Pharynx and larynx: Negative larynx. Stable capacious appearance of  the pharynx. No pharyngeal mass. Negative parapharyngeal and retropharyngeal spaces. Salivary glands: Negative sublingual space. Absent right submandibular gland. Stable, diminutive left submandibular gland. Stable diminutive left parotid gland. Stable right parotid gland, the inferior posterior margin of which is somewhat inseparable from the chronic right sternocleidomastoid muscle metastasis which has its epicenter at the C2-C3 vertebral level.  This 2-3 cm area of mass-like soft tissue area appears not significantly changed since May but is mildly smaller compared to 2016. There is continued regression of the patchy hyper enhancement (e.g. Series 6, image 41 today versus series 3, image 24 previously). As before, the anterior margin of this lesion is inseparable from the right carotid space but there is no mass effect on the right carotid artery. 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 Thyroid: Stable and negative. Lymph nodes: Stable subcentimeter left level IIa and level IIIb, level IV lymph nodes since May. No right side cervical lymphadenopathy. No cystic or necrotic nodes. 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 Other major vascular structures in the neck and at the skullbase are patent. Partially visible right subclavian approach chest porta cath. Limited intracranial: Negative. Visualized orbits: Visualized orbit soft tissues are within normal limits. Mastoids and visualized paranasal sinuses: Stable inferior maxillary sinus mucosal thickening greater on the left. Other Visualized paranasal sinuses and mastoids are stable and well pneumatized. Skeleton: The skullbase appears stable and intact. No acute or suspicious osseous lesions in the neck. Upper chest: Reported separately today. IMPRESSION: 1. Right sternocleidomastoid muscle metastasis is further decreased in conspicuity since May, and has mildly decreased in size since September 2016. 2. Continued stability of  sub-centimeter left cervical lymph nodes. 3. No new or progressive metastatic disease in the neck. 4. Chest CT today reported separately. Electronically Signed   By: Genevie Ann M.D.   On: 06/16/2016 15:35   Ct Chest W Contrast  Result Date: 06/16/2016 CLINICAL DATA:  Primary nasopharyngeal cancer diagnosed in 2007 with lung metastases diagnosed in 2013, status post chemotherapy and radiation therapy with ongoing immunotherapy, presenting for restaging. EXAM: CT CHEST WITH CONTRAST TECHNIQUE: Multidetector CT imaging of the chest was performed during intravenous contrast administration. CONTRAST:  57m ISOVUE-300 IOPAMIDOL (ISOVUE-300) INJECTION 61% COMPARISON:  10/29/2015 PET-CT and 06/19/2015 chest CT. FINDINGS: Cardiovascular: Normal heart size. No significant pericardial fluid/thickening. Right internal jugular MediPort terminates near the cavoatrial junction. Great vessels are normal in course and caliber. No central pulmonary emboli. Mediastinum/Nodes: No discrete thyroid nodules. Unremarkable esophagus. No pathologically enlarged axillary, mediastinal or hilar lymph nodes. Lungs/Pleura: No pneumothorax. No pleural effusion. Suture line is seen in the left upper lobe from prior wedge resection. Masslike fibrosis with surrounding distortion in the posterior parahilar left upper lobe measures 2.1 x 1.6 cm (series 5/ image 47), previously 2.1 x 1.6 cm on 10/29/2015, stable. Stable 6 mm irregular solid left lower lobe pulmonary nodule (series 5/ image 83). Stable 3 mm posterior left lower lobe pulmonary nodule (series 5/ image 72). Two right lower lobe pulmonary nodules, largest 5 mm (series 5/image 88) are stable. No acute consolidative airspace disease or new significant pulmonary nodules. Upper abdomen: Nonobstructing 4 mm upper left renal stone. Musculoskeletal:  No aggressive appearing focal osseous lesions. IMPRESSION: 1. Stable masslike radiation fibrosis in the left upper lobe. 2. Stable subcentimeter  pulmonary nodules in the bilateral lower lobes. 3. No new or progressive metastatic disease in the chest. 4. Nonobstructing left renal stone. Electronically Signed   By: JIlona SorrelM.D.   On: 06/16/2016 17:42     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 He had excellent response to treatment. 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 Observe only CT chest in September 2017 showed no evidence of disease progression  Neck pain on right side The patient has significant fibrosis and scar tissue from prior surgery and radiation. I recommend continue conservative management with physical therapy, topical lidocaine and pain medicine as needed   No orders of the defined types were placed in this encounter.  All questions were answered. The patient knows to call the clinic with any problems, questions or concerns. No barriers to learning was detected. I spent 15 minutes counseling the patient face to face. The total time spent in the appointment was 20 minutes and more than 50% was on counseling and review of test results     [facility], [place], MD 06/17/2016 11:59 AM

## 2016-06-17 NOTE — Patient Instructions (Signed)
[facility] Discharge Instructions for Patients Receiving Chemotherapy  Today you received the following chemotherapy agents:  Opdivo  (nivolumab)  To help prevent nausea and vomiting after your treatment, we encourage you to take your nausea medication as prescribed.   If you develop nausea and vomiting that is not controlled by your nausea medication, call the clinic.   BELOW ARE SYMPTOMS THAT SHOULD BE REPORTED IMMEDIATELY:  *FEVER GREATER THAN 100.5 F  *CHILLS WITH OR WITHOUT FEVER  NAUSEA AND VOMITING THAT IS NOT CONTROLLED WITH YOUR NAUSEA MEDICATION  *UNUSUAL SHORTNESS OF BREATH  *UNUSUAL BRUISING OR BLEEDING  TENDERNESS IN MOUTH AND THROAT WITH OR WITHOUT PRESENCE OF ULCERS  *URINARY PROBLEMS  *BOWEL PROBLEMS  UNUSUAL RASH Items with * indicate a potential emergency and should be followed up as soon as possible.  Feel free to call the clinic you have any questions or concerns. The clinic phone number is [phone].  Please show the [facility] at check-in to the Emergency Department and triage nurse.

## 2016-06-17 NOTE — Telephone Encounter (Signed)
Message sent to chemo scheduler to add chemo. Avs report and schedule given per 06/17/16 los.

## 2016-06-17 NOTE — Assessment & Plan Note (Addendum)
He is not symptomatic. Observe only CT chest in September 2017 showed no evidence of disease progression

## 2016-06-17 NOTE — Assessment & Plan Note (Signed)
He had excellent response to treatment. CT scan in September 2017 showed excellent response to treatment We will continue Nivolumab every 2 weeks and I plan to order a CT scan of the neck and chest with IV contrast again in 6 months

## 2016-06-18 ENCOUNTER — Telehealth: Payer: Self-pay | Admitting: *Deleted

## 2016-06-18 NOTE — Telephone Encounter (Signed)
Per LOS I have scheduled appts and notified the scheduler 

## 2016-06-23 ENCOUNTER — Other Ambulatory Visit: Payer: Self-pay | Admitting: Hematology and Oncology

## 2016-07-01 ENCOUNTER — Ambulatory Visit (HOSPITAL_BASED_OUTPATIENT_CLINIC_OR_DEPARTMENT_OTHER): Payer: Medicaid Other

## 2016-07-01 ENCOUNTER — Other Ambulatory Visit (HOSPITAL_BASED_OUTPATIENT_CLINIC_OR_DEPARTMENT_OTHER): Payer: Medicaid Other

## 2016-07-01 ENCOUNTER — Ambulatory Visit: Payer: Medicaid Other

## 2016-07-01 VITALS — BP 117/73 | HR 77 | Temp 98.2°F | Resp 18

## 2016-07-01 DIAGNOSIS — C760 Malignant neoplasm of head, face and neck: Secondary | ICD-10-CM | POA: Diagnosis not present

## 2016-07-01 DIAGNOSIS — E039 Hypothyroidism, unspecified: Secondary | ICD-10-CM

## 2016-07-01 DIAGNOSIS — C7800 Secondary malignant neoplasm of unspecified lung: Secondary | ICD-10-CM

## 2016-07-01 DIAGNOSIS — I878 Other specified disorders of veins: Secondary | ICD-10-CM

## 2016-07-01 DIAGNOSIS — Z5112 Encounter for antineoplastic immunotherapy: Secondary | ICD-10-CM

## 2016-07-01 LAB — COMPREHENSIVE METABOLIC PANEL
ALBUMIN: 3.9 g/dL (ref 3.5–5.0)
ALK PHOS: 63 U/L (ref 40–150)
ALT: 18 U/L (ref 0–55)
AST: 19 U/L (ref 5–34)
Anion Gap: 12 mEq/L — ABNORMAL HIGH (ref 3–11)
BUN: 13.3 mg/dL (ref 7.0–26.0)
CALCIUM: 9.3 mg/dL (ref 8.4–10.4)
CO2: 22 mEq/L (ref 22–29)
CREATININE: 1 mg/dL (ref 0.7–1.3)
Chloride: 107 mEq/L (ref 98–109)
EGFR: >90 mL/min/{1.73_m2} (ref >90)
Glucose: 139 mg/dl (ref 70–140)
Potassium: 3.6 mEq/L (ref 3.5–5.1)
Sodium: 141 mEq/L (ref 136–145)
TOTAL PROTEIN: 6.8 g/dL (ref 6.4–8.3)
Total Bilirubin: 0.46 mg/dL (ref 0.20–1.20)

## 2016-07-01 LAB — CBC WITH DIFFERENTIAL/PLATELET
BASO%: 0.5 % (ref 0.0–2.0)
BASOS ABS: 0.1 10*3/uL (ref 0.0–0.1)
EOS%: 1.9 % (ref 0.0–7.0)
Eosinophils Absolute: 0.2 10*3/uL (ref 0.0–0.5)
HEMATOCRIT: 42.5 % (ref 38.4–49.9)
HEMOGLOBIN: 14.2 g/dL (ref 13.0–17.1)
LYMPH#: 1.1 10*3/uL (ref 0.9–3.3)
LYMPH%: 11.4 % — ABNORMAL LOW (ref 14.0–49.0)
MCH: 29.4 pg (ref 27.2–33.4)
MCHC: 33.5 g/dL (ref 32.0–36.0)
MCV: 87.8 fL (ref 79.3–98.0)
MONO#: 0.6 10*3/uL (ref 0.1–0.9)
MONO%: 6.1 % (ref 0.0–14.0)
NEUT#: 7.7 10*3/uL — ABNORMAL HIGH (ref 1.5–6.5)
NEUT%: 80.1 % — ABNORMAL HIGH (ref 39.0–75.0)
Platelets: 191 10*3/uL (ref 140–400)
RBC: 4.84 10*6/uL (ref 4.20–5.82)
RDW: 13.8 % (ref 11.0–14.6)
WBC: 9.6 10*3/uL (ref 4.0–10.3)

## 2016-07-01 LAB — TSH: TSH: 2.114 m(IU)/L (ref 0.320–4.118)

## 2016-07-01 MED ORDER — SODIUM CHLORIDE 0.9 % IJ SOLN
10.0000 mL | INTRAMUSCULAR | Status: AC | PRN
  Administered 2016-07-01: 10 mL
  Filled 2016-07-01: qty 10

## 2016-07-01 MED ORDER — SODIUM CHLORIDE 0.9 % IJ SOLN
10.0000 mL | INTRAMUSCULAR | Status: DC | PRN
  Administered 2016-07-01: 10 mL
  Filled 2016-07-01: qty 10

## 2016-07-01 MED ORDER — SODIUM CHLORIDE 0.9 % IV SOLN
Freq: Once | INTRAVENOUS | Status: AC
  Administered 2016-07-01: 10:00:00 via INTRAVENOUS

## 2016-07-01 MED ORDER — NIVOLUMAB CHEMO INJECTION 100 MG/10ML
240.0000 mg | Freq: Once | INTRAVENOUS | Status: AC
  Administered 2016-07-01: 240 mg via INTRAVENOUS
  Filled 2016-07-01: qty 20

## 2016-07-01 MED ORDER — ANTICOAGULANT SODIUM CITRATE 4% (200MG/5ML) IV SOLN
5.0000 mL | Status: DC | PRN
Start: 2016-07-01 — End: 2016-07-01
  Administered 2016-07-01: 5 mL via INTRAVENOUS
  Filled 2016-07-01: qty 5

## 2016-07-01 NOTE — Progress Notes (Signed)
Pt wasn't feeling well. Stated that Dr. Alvy Bimler wanted infusion nurse to asses him before giving chemo. Called infusion room and let nurse know what pt stated. Pt was sent back to infusion for assessment.

## 2016-07-01 NOTE — Progress Notes (Signed)
Pt presents to clinic today w/ c/o cold symptoms x 3 days. Reports coughing and upper respiratory congestion. He thinks he caught a cold from his 30 yr old son. Denies fever/ n/v/d.  He otherwise feels ok.  Notified Dr. Alvy Bimler and she orders ok to treat today as scheduled.

## 2016-07-01 NOTE — Patient Instructions (Signed)
[facility] Discharge Instructions for Patients Receiving Chemotherapy  Today you received the following chemotherapy agents, Nivolumab  To help prevent nausea and vomiting after your treatment, we encourage you to take your nausea medication as directed.   If you develop nausea and vomiting that is not controlled by your nausea medication, call the clinic.   BELOW ARE SYMPTOMS THAT SHOULD BE REPORTED IMMEDIATELY:  *FEVER GREATER THAN 100.5 F  *CHILLS WITH OR WITHOUT FEVER  NAUSEA AND VOMITING THAT IS NOT CONTROLLED WITH YOUR NAUSEA MEDICATION  *UNUSUAL SHORTNESS OF BREATH  *UNUSUAL BRUISING OR BLEEDING  TENDERNESS IN MOUTH AND THROAT WITH OR WITHOUT PRESENCE OF ULCERS  *URINARY PROBLEMS  *BOWEL PROBLEMS  UNUSUAL RASH Items with * indicate a potential emergency and should be followed up as soon as possible.  Feel free to call the clinic you have any questions or concerns. The clinic phone number is [phone].  Please show the [facility] at check-in to the Emergency Department and triage nurse.

## 2016-07-15 ENCOUNTER — Ambulatory Visit (HOSPITAL_BASED_OUTPATIENT_CLINIC_OR_DEPARTMENT_OTHER): Payer: Medicaid Other

## 2016-07-15 ENCOUNTER — Ambulatory Visit: Payer: Medicaid Other

## 2016-07-15 ENCOUNTER — Other Ambulatory Visit (HOSPITAL_BASED_OUTPATIENT_CLINIC_OR_DEPARTMENT_OTHER): Payer: Medicaid Other

## 2016-07-15 VITALS — BP 103/56 | HR 70 | Temp 98.4°F | Resp 18

## 2016-07-15 DIAGNOSIS — C760 Malignant neoplasm of head, face and neck: Secondary | ICD-10-CM

## 2016-07-15 DIAGNOSIS — Z5112 Encounter for antineoplastic immunotherapy: Secondary | ICD-10-CM | POA: Diagnosis not present

## 2016-07-15 DIAGNOSIS — C7800 Secondary malignant neoplasm of unspecified lung: Secondary | ICD-10-CM

## 2016-07-15 DIAGNOSIS — I878 Other specified disorders of veins: Secondary | ICD-10-CM

## 2016-07-15 LAB — COMPREHENSIVE METABOLIC PANEL
ALK PHOS: 74 U/L (ref 40–150)
ALT: 19 U/L (ref 0–55)
ANION GAP: 10 meq/L (ref 3–11)
AST: 19 U/L (ref 5–34)
Albumin: 3.9 g/dL (ref 3.5–5.0)
BILIRUBIN TOTAL: 0.28 mg/dL (ref 0.20–1.20)
BUN: 11.4 mg/dL (ref 7.0–26.0)
CALCIUM: 9.4 mg/dL (ref 8.4–10.4)
CO2: 25 meq/L (ref 22–29)
Chloride: 107 mEq/L (ref 98–109)
Creatinine: 0.9 mg/dL (ref 0.7–1.3)
Glucose: 113 mg/dl (ref 70–140)
Potassium: 4.1 mEq/L (ref 3.5–5.1)
Sodium: 141 mEq/L (ref 136–145)
TOTAL PROTEIN: 7.2 g/dL (ref 6.4–8.3)

## 2016-07-15 LAB — CBC WITH DIFFERENTIAL/PLATELET
BASO%: 0.6 % (ref 0.0–2.0)
Basophils Absolute: 0 10*3/uL (ref 0.0–0.1)
EOS ABS: 0.1 10*3/uL (ref 0.0–0.5)
EOS%: 1.8 % (ref 0.0–7.0)
HCT: 42.4 % (ref 38.4–49.9)
HGB: 14.3 g/dL (ref 13.0–17.1)
LYMPH%: 19.8 % (ref 14.0–49.0)
MCH: 29.3 pg (ref 27.2–33.4)
MCHC: 33.8 g/dL (ref 32.0–36.0)
MCV: 86.5 fL (ref 79.3–98.0)
MONO#: 0.5 10*3/uL (ref 0.1–0.9)
MONO%: 7.1 % (ref 0.0–14.0)
NEUT%: 70.7 % (ref 39.0–75.0)
NEUTROS ABS: 5.2 10*3/uL (ref 1.5–6.5)
PLATELETS: 247 10*3/uL (ref 140–400)
RBC: 4.9 10*6/uL (ref 4.20–5.82)
RDW: 13.7 % (ref 11.0–14.6)
WBC: 7.4 10*3/uL (ref 4.0–10.3)
lymph#: 1.5 10*3/uL (ref 0.9–3.3)

## 2016-07-15 MED ORDER — ANTICOAGULANT SODIUM CITRATE 4% (200MG/5ML) IV SOLN
5.0000 mL | Status: DC | PRN
  Administered 2016-07-15: 5 mL via INTRAVENOUS
  Filled 2016-07-15: qty 5

## 2016-07-15 MED ORDER — NIVOLUMAB CHEMO INJECTION 100 MG/10ML
240.0000 mg | Freq: Once | INTRAVENOUS | Status: AC
  Administered 2016-07-15: 240 mg via INTRAVENOUS
  Filled 2016-07-15: qty 20

## 2016-07-15 MED ORDER — SODIUM CHLORIDE 0.9 % IV SOLN
Freq: Once | INTRAVENOUS | Status: AC
Start: 2016-07-15 — End: 2016-07-15
  Administered 2016-07-15: 12:00:00 via INTRAVENOUS

## 2016-07-15 MED ORDER — SODIUM CHLORIDE 0.9 % IJ SOLN
10.0000 mL | INTRAMUSCULAR | Status: AC | PRN
  Administered 2016-07-15: 10 mL
  Filled 2016-07-15: qty 10

## 2016-07-15 MED ORDER — SODIUM CHLORIDE 0.9 % IJ SOLN
10.0000 mL | INTRAMUSCULAR | Status: DC | PRN
  Filled 2016-07-15: qty 10

## 2016-07-15 NOTE — Patient Instructions (Signed)
[facility] Discharge Instructions for Patients Receiving Chemotherapy  Today you received the following chemotherapy agents:  Nivolumab (Opdivo)  To help prevent nausea and vomiting after your treatment, we encourage you to take your nausea medication as prescribed.   If you develop nausea and vomiting that is not controlled by your nausea medication, call the clinic.   BELOW ARE SYMPTOMS THAT SHOULD BE REPORTED IMMEDIATELY:  *FEVER GREATER THAN 100.5 F  *CHILLS WITH OR WITHOUT FEVER  NAUSEA AND VOMITING THAT IS NOT CONTROLLED WITH YOUR NAUSEA MEDICATION  *UNUSUAL SHORTNESS OF BREATH  *UNUSUAL BRUISING OR BLEEDING  TENDERNESS IN MOUTH AND THROAT WITH OR WITHOUT PRESENCE OF ULCERS  *URINARY PROBLEMS  *BOWEL PROBLEMS  UNUSUAL RASH Items with * indicate a potential emergency and should be followed up as soon as possible.  Feel free to call the clinic you have any questions or concerns. The clinic phone number is [phone].  Please show the [facility] at check-in to the Emergency Department and triage nurse.

## 2016-08-03 ENCOUNTER — Other Ambulatory Visit (HOSPITAL_BASED_OUTPATIENT_CLINIC_OR_DEPARTMENT_OTHER): Payer: Medicaid Other

## 2016-08-03 ENCOUNTER — Ambulatory Visit (HOSPITAL_BASED_OUTPATIENT_CLINIC_OR_DEPARTMENT_OTHER): Payer: Medicaid Other | Admitting: Hematology and Oncology

## 2016-08-03 ENCOUNTER — Ambulatory Visit (HOSPITAL_BASED_OUTPATIENT_CLINIC_OR_DEPARTMENT_OTHER): Payer: Medicaid Other

## 2016-08-03 ENCOUNTER — Encounter: Payer: Self-pay | Admitting: Hematology and Oncology

## 2016-08-03 ENCOUNTER — Ambulatory Visit: Payer: Medicaid Other

## 2016-08-03 ENCOUNTER — Telehealth: Payer: Self-pay | Admitting: Hematology and Oncology

## 2016-08-03 DIAGNOSIS — C760 Malignant neoplasm of head, face and neck: Secondary | ICD-10-CM

## 2016-08-03 DIAGNOSIS — Z5112 Encounter for antineoplastic immunotherapy: Secondary | ICD-10-CM | POA: Diagnosis present

## 2016-08-03 DIAGNOSIS — C7800 Secondary malignant neoplasm of unspecified lung: Secondary | ICD-10-CM

## 2016-08-03 DIAGNOSIS — E039 Hypothyroidism, unspecified: Secondary | ICD-10-CM

## 2016-08-03 DIAGNOSIS — I878 Other specified disorders of veins: Secondary | ICD-10-CM

## 2016-08-03 LAB — COMPREHENSIVE METABOLIC PANEL
ALT: 22 U/L (ref 0–55)
ANION GAP: 8 meq/L (ref 3–11)
AST: 21 U/L (ref 5–34)
Albumin: 4.1 g/dL (ref 3.5–5.0)
Alkaline Phosphatase: 67 U/L (ref 40–150)
BILIRUBIN TOTAL: 0.39 mg/dL (ref 0.20–1.20)
BUN: 20.1 mg/dL (ref 7.0–26.0)
CHLORIDE: 106 meq/L (ref 98–109)
CO2: 26 meq/L (ref 22–29)
Calcium: 9.7 mg/dL (ref 8.4–10.4)
Creatinine: 0.9 mg/dL (ref 0.7–1.3)
Glucose: 105 mg/dl (ref 70–140)
Potassium: 4.1 mEq/L (ref 3.5–5.1)
Sodium: 140 mEq/L (ref 136–145)
Total Protein: 7.3 g/dL (ref 6.4–8.3)

## 2016-08-03 LAB — CBC WITH DIFFERENTIAL/PLATELET
BASO%: 0.7 % (ref 0.0–2.0)
BASOS ABS: 0 10*3/uL (ref 0.0–0.1)
EOS ABS: 0.2 10*3/uL (ref 0.0–0.5)
EOS%: 3 % (ref 0.0–7.0)
HCT: 44.6 % (ref 38.4–49.9)
HGB: 14.9 g/dL (ref 13.0–17.1)
LYMPH%: 25.6 % (ref 14.0–49.0)
MCH: 29 pg (ref 27.2–33.4)
MCHC: 33.4 g/dL (ref 32.0–36.0)
MCV: 86.7 fL (ref 79.3–98.0)
MONO#: 0.5 10*3/uL (ref 0.1–0.9)
MONO%: 9.4 % (ref 0.0–14.0)
NEUT#: 3.3 10*3/uL (ref 1.5–6.5)
NEUT%: 61.3 % (ref 39.0–75.0)
PLATELETS: 174 10*3/uL (ref 140–400)
RBC: 5.14 10*6/uL (ref 4.20–5.82)
RDW: 14.2 % (ref 11.0–14.6)
WBC: 5.3 10*3/uL (ref 4.0–10.3)
lymph#: 1.4 10*3/uL (ref 0.9–3.3)

## 2016-08-03 LAB — TSH: TSH: 2.238 m(IU)/L (ref 0.320–4.118)

## 2016-08-03 MED ORDER — SODIUM CHLORIDE 0.9 % IV SOLN
240.0000 mg | Freq: Once | INTRAVENOUS | Status: AC
  Administered 2016-08-03: 240 mg via INTRAVENOUS
  Filled 2016-08-03: qty 20

## 2016-08-03 MED ORDER — ANTICOAGULANT SODIUM CITRATE 4% (200MG/5ML) IV SOLN
5.0000 mL | Status: DC | PRN
  Administered 2016-08-03: 5 mL via INTRAVENOUS
  Filled 2016-08-03: qty 5

## 2016-08-03 MED ORDER — SODIUM CHLORIDE 0.9 % IV SOLN
Freq: Once | INTRAVENOUS | Status: AC
  Administered 2016-08-03: 15:00:00 via INTRAVENOUS

## 2016-08-03 MED ORDER — SODIUM CHLORIDE 0.9 % IJ SOLN
10.0000 mL | INTRAMUSCULAR | Status: AC | PRN
  Administered 2016-08-03: 10 mL
  Filled 2016-08-03: qty 10

## 2016-08-03 MED ORDER — SODIUM CHLORIDE 0.9 % IJ SOLN
10.0000 mL | INTRAMUSCULAR | Status: DC | PRN
  Administered 2016-08-03: 10 mL
  Filled 2016-08-03: qty 10

## 2016-08-03 NOTE — Assessment & Plan Note (Signed)
He had excellent response to treatment. CT scan in September 2017 showed excellent response to treatment We will continue Nivolumab every 2 weeks and I plan to order a CT scan of the neck and chest with IV contrast again in 6 months

## 2016-08-03 NOTE — Assessment & Plan Note (Signed)
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 He is compliant taking his thyroid medicine. I reinforced the importance of taking his thyroid replacement therapy regularly. TSH today showed adequate replacement

## 2016-08-03 NOTE — Assessment & Plan Note (Signed)
He is not symptomatic. Observe only CT chest in September 2017 showed no evidence of disease progression

## 2016-08-03 NOTE — Progress Notes (Signed)
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 Pan-endoscopy elsewhere showed cancer from nasopharyngeal space.      01/04/2007 - 02/20/2007 Chemotherapy    He received 2 cycles of cisplatin and 5FU followed by concurrent chemo with weekly cisplatin and radiation. He only received 2 doses of chemo due to severe mucositis, nausea and weight loss.      04/05/2007 - 08/04/2007 Chemotherapy    He received 4 more courses of cisplatin with 5FU and had complete response      07/05/2009 Procedure    Fine-needle aspirate of the right level II lymph nodes come from recurrent metastatic disease. Repeat endoscopy and CT scan show no evidence of disease elsewhere.      07/08/2009 - 12/02/2009 Chemotherapy    He was given 6 cycles of carboplatin, 5-FU and docetaxel      12/03/2009 Surgery    He has surgery to the residual lymph node on the right neck which showed no evidence of disease.      02/22/2012 Imaging    Repeat imaging study showed large recurrent mass. He was referred  elsewhere for further treatment.      05/03/2012 Surgery    He underwent left upper lobectomy.      04/29/2013 Imaging    PEt scan showed lesion on right level II B and lower lung was abnormal      06/03/2013 - 02/02/2014 Chemotherapy    He had 6 cycles of chemotherapy when he was found to have recurrence of cancer and had received oxaliplatin and capecitabine      06/07/2014 Imaging    PET CT scan showed persistent disease in the right neck lymph nodes and left lung      06/29/2014 Procedure    Accession: [id number] repeat LUL biopsy confirmed metastatic cancer      07/18/2014 - 07/31/2014 Radiation Therapy    He received palliative radiation therapy to the lungs      10/10/2014 Imaging    CT scan of the chest, abdomen and pelvis show regression in the size of the lung nodule in the left upper lobe and stable pulmonary nodules      01/24/2015 Imaging    CT scan showed stable disease in neck and lung      06/19/2015 Imaging    CT scan of the neck and the chest show possible mild progression of the nodule in the right side of the neck.      06/25/2015 Imaging    PET scan confirmed disease recurrence in the neck  07/07/2015 Imaging    He had [facility] neck [facility]      09/03/2015 -  Chemotherapy    He received palliative chemo with Nivolumab      10/29/2015 Imaging    PET CT showed positive response to Rx      02/28/2016 Imaging    Ct abdomen showed abnormal thinkening in his stomach      03/03/2016 Imaging    CT: Right sternocleidomastoid muscle metastasis appears less distinct but otherwise not significantly changed in size or configuration since 06/19/2015.2. Left level 3 lymph node which was hypermetabolic by PET-CT in January 2017 appears slightly smaller      04/01/2016 Imaging    CT cervical spine showed no acute fracture or traumatic malalignment in the cervical spine      04/22/2016 Procedure    Port-a-cath placed.      06/16/2016 Imaging    Ct neck showed  right sternocleidomastoid muscle metastasis is further decreased in conspicuity since May, and has mildly decreased in size since September 2016. Continued stability of sub-centimeter left cervical lymph nodes. No new or progressive metastatic disease in the neck.      06/16/2016 Imaging    CT chest showed stable masslike radiation fibrosis in the left upper lobe. Stable subcentimeter pulmonary nodules in the bilateral lower lobes. No new or progressive metastatic disease in the chest. Nonobstructing left renal stone.       INTERVAL HISTORY: Please see below for problem oriented charting. He returns for further follow-up along with chemotherapy. He denies new infection. He is compliant taking his thyroid supplement. He denies recent worsening neck pain  REVIEW OF SYSTEMS:   Constitutional: Denies fevers, chills or abnormal weight loss Eyes: Denies blurriness of vision Ears, nose, mouth, throat, and face: Denies mucositis or sore throat Respiratory: Denies cough, dyspnea or wheezes Cardiovascular: Denies palpitation, chest discomfort or lower extremity swelling Gastrointestinal:  Denies nausea, heartburn or change in bowel habits Skin: Denies abnormal skin rashes Lymphatics: Denies new lymphadenopathy or easy bruising Neurological:Denies numbness, tingling or new weaknesses Behavioral/Psych: Mood is stable, no new changes  All other systems were reviewed with the patient and are negative.  I have reviewed the past medical history, past surgical history, social history and family history with the patient and they are unchanged from previous note.  ALLERGIES:  is allergic to heparin.  MEDICATIONS:  Current Outpatient Prescriptions  Medication Sig Dispense Refill  . levothyroxine (SYNTHROID, LEVOTHROID) 75 MCG tablet TAKE 1 TABLET BY MOUTH EVERY DAY BEFORE BREAKFAST 90 tablet 0  . lidocaine-prilocaine (EMLA) cream Apply 1 application topically as needed. 30 g 3  . omeprazole  (PRILOSEC) 20 MG capsule TAKE ONE CAPSULE BY MOUTH TWICE DAILY BEFORE A MEAL 600 capsule 1  . ondansetron (ZOFRAN) 8 MG tablet Take 1 tablet (8 mg total) by mouth every 8 (eight) hours as needed for nausea or vomiting. 90 tablet 1  . oxyCODONE (ROXICODONE) 15 MG immediate release tablet Take 7.5-15 mg by mouth every 4 (four) hours as needed for pain.    Marland Kitchen tobramycin (TOBREX) 0.3 % ophthalmic solution Place 1 drop into the left eye every 4 (four) hours. 5 mL 0   No current facility-administered medications for this visit.    Facility-Administered Medications Ordered in Other Visits  Medication Dose Route Frequency Provider Last Rate Last Dose  . anticoagulant sodium citrate solution 5 mL  5 mL Intravenous PRN [name], MD      . nivolumab (OPDIVO) 240 mg in  sodium chloride 0.9 % 100 mL chemo infusion  240 mg Intravenous Once [name], MD      . sodium chloride 0.9 % injection 10 mL  10 mL Intracatheter PRN [name], MD      . sodium chloride 0.9 % injection 10 mL  10 mL Intracatheter PRN [name], MD        PHYSICAL EXAMINATION: ECOG PERFORMANCE STATUS: 0 - Asymptomatic  Vitals:   08/03/16 1352  BP: 120/76  Pulse: 84  Resp: 18  Temp: 97.9 F (36.6 C)   Filed Weights   08/03/16 1352  Weight: 157 lb 8 oz (71.4 kg)    GENERAL:alert, no distress and comfortable SKIN: skin color, texture, turgor are normal, no rashes or significant lesions EYES: normal, Conjunctiva are pink and non-injected, sclera clear OROPHARYNX:no exudate, no erythema and lips, buccal mucosa, and tongue normal  NECK: No lower surgical scar. No other palpable abnormality LYMPH:  no palpable lymphadenopathy in the cervical, axillary or inguinal LUNGS: clear to auscultation and percussion with normal breathing effort HEART: regular rate & rhythm and no murmurs and no lower extremity edema ABDOMEN:abdomen soft, non-tender and normal bowel sounds Musculoskeletal:no cyanosis of digits and no clubbing  NEURO:  alert & oriented x 3 with fluent speech, no focal motor/sensory deficits  LABORATORY DATA:  I have reviewed the data as listed    Component Value Date/Time   NA 140 08/03/2016 1303   K 4.1 08/03/2016 1303   CL 104 06/29/2014 0655   CO2 26 08/03/2016 1303   GLUCOSE 105 08/03/2016 1303   BUN 20.1 08/03/2016 1303   CREATININE 0.9 08/03/2016 1303   CALCIUM 9.7 08/03/2016 1303   PROT 7.3 08/03/2016 1303   ALBUMIN 4.1 08/03/2016 1303   AST 21 08/03/2016 1303   ALT 22 08/03/2016 1303   ALKPHOS 67 08/03/2016 1303   BILITOT 0.39 08/03/2016 1303   GFRNONAA >90 06/29/2014 0655   GFRAA >90 06/29/2014 0655    No results found for: SPEP, UPEP  Lab Results  Component Value Date   WBC 5.3 08/03/2016   NEUTROABS 3.3 08/03/2016   HGB 14.9 08/03/2016   HCT 44.6 08/03/2016   MCV 86.7 08/03/2016   PLT 174 08/03/2016      Chemistry      Component Value Date/Time   NA 140 08/03/2016 1303   K 4.1 08/03/2016 1303   CL 104 06/29/2014 0655   CO2 26 08/03/2016 1303   BUN 20.1 08/03/2016 1303   CREATININE 0.9 08/03/2016 1303      Component Value Date/Time   CALCIUM 9.7 08/03/2016 1303   ALKPHOS 67 08/03/2016 1303   AST 21 08/03/2016 1303   ALT 22 08/03/2016 1303   BILITOT 0.39 08/03/2016 1303      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[place]) He had excellent response to treatment. 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 Observe only CT chest in September 2017 showed no evidence of disease progression  Acquired hypothyroidism He has acquired hypothyroidism from prior radiation and surgery to his neck. He is compliant taking his thyroid medicine. I reinforced the importance of taking his thyroid replacement therapy regularly. TSH today showed adequate replacement   No  orders of the defined types were placed in  this encounter.  All questions were answered. The patient knows to call the clinic with any problems, questions or concerns. No barriers to learning was detected. I spent 15 minutes counseling the patient face to face. The total time spent in the appointment was 20 minutes and more than 50% was on counseling and review of test results     [name], MD 08/03/2016 2:53 PM

## 2016-08-03 NOTE — Telephone Encounter (Signed)
Appointments scheduled per 10/30 LOS. Patient refused AVS report and calendar.

## 2016-08-03 NOTE — Patient Instructions (Signed)
[facility] Discharge Instructions for Patients Receiving Chemotherapy  Today you received the following chemotherapy agents Opdivo.  To help prevent nausea and vomiting after your treatment, we encourage you to take your nausea medication as directed.   If you develop nausea and vomiting that is not controlled by your nausea medication, call the clinic.   BELOW ARE SYMPTOMS THAT SHOULD BE REPORTED IMMEDIATELY:  *FEVER GREATER THAN 100.5 F  *CHILLS WITH OR WITHOUT FEVER  NAUSEA AND VOMITING THAT IS NOT CONTROLLED WITH YOUR NAUSEA MEDICATION  *UNUSUAL SHORTNESS OF BREATH  *UNUSUAL BRUISING OR BLEEDING  TENDERNESS IN MOUTH AND THROAT WITH OR WITHOUT PRESENCE OF ULCERS  *URINARY PROBLEMS  *BOWEL PROBLEMS  UNUSUAL RASH Items with * indicate a potential emergency and should be followed up as soon as possible.  Feel free to call the clinic you have any questions or concerns. The clinic phone number is [phone].  Please show the CHEMO ALERT CARD at check-in to the Emergency Department and triage nurse.    

## 2016-08-04 ENCOUNTER — Telehealth: Payer: Self-pay | Admitting: *Deleted

## 2016-08-04 NOTE — Telephone Encounter (Signed)
Per the LOS I have scheduled appts and notified the scheduler 

## 2016-08-24 ENCOUNTER — Ambulatory Visit: Payer: Medicaid Other

## 2016-08-24 ENCOUNTER — Ambulatory Visit: Payer: Medicaid Other | Admitting: Hematology and Oncology

## 2016-08-24 ENCOUNTER — Other Ambulatory Visit: Payer: Medicaid Other

## 2016-08-25 ENCOUNTER — Telehealth: Payer: Self-pay | Admitting: Hematology and Oncology

## 2016-08-25 NOTE — Telephone Encounter (Signed)
sw pt to confirm 12/4 appt date/time per LOS. Transferred call to desk nurse per pt request to confirm treatment plan change

## 2016-09-07 ENCOUNTER — Encounter: Payer: Self-pay | Admitting: Hematology and Oncology

## 2016-09-07 ENCOUNTER — Other Ambulatory Visit (HOSPITAL_BASED_OUTPATIENT_CLINIC_OR_DEPARTMENT_OTHER): Payer: Medicaid Other

## 2016-09-07 ENCOUNTER — Ambulatory Visit: Payer: Medicaid Other

## 2016-09-07 ENCOUNTER — Telehealth: Payer: Self-pay | Admitting: Hematology and Oncology

## 2016-09-07 ENCOUNTER — Ambulatory Visit (HOSPITAL_BASED_OUTPATIENT_CLINIC_OR_DEPARTMENT_OTHER): Payer: Medicaid Other | Admitting: Hematology and Oncology

## 2016-09-07 ENCOUNTER — Ambulatory Visit (HOSPITAL_BASED_OUTPATIENT_CLINIC_OR_DEPARTMENT_OTHER): Payer: Medicaid Other

## 2016-09-07 VITALS — BP 121/73 | HR 66 | Temp 98.0°F | Resp 18 | Ht 67.75 in | Wt 161.7 lb

## 2016-09-07 DIAGNOSIS — C7800 Secondary malignant neoplasm of unspecified lung: Secondary | ICD-10-CM

## 2016-09-07 DIAGNOSIS — I878 Other specified disorders of veins: Secondary | ICD-10-CM

## 2016-09-07 DIAGNOSIS — C760 Malignant neoplasm of head, face and neck: Secondary | ICD-10-CM

## 2016-09-07 DIAGNOSIS — Z5112 Encounter for antineoplastic immunotherapy: Secondary | ICD-10-CM | POA: Diagnosis not present

## 2016-09-07 DIAGNOSIS — E039 Hypothyroidism, unspecified: Secondary | ICD-10-CM | POA: Diagnosis not present

## 2016-09-07 DIAGNOSIS — M542 Cervicalgia: Secondary | ICD-10-CM

## 2016-09-07 LAB — CBC WITH DIFFERENTIAL/PLATELET
BASO%: 0.4 % (ref 0.0–2.0)
BASOS ABS: 0 10*3/uL (ref 0.0–0.1)
EOS ABS: 0.1 10*3/uL (ref 0.0–0.5)
EOS%: 2.4 % (ref 0.0–7.0)
HCT: 41.5 % (ref 38.4–49.9)
HEMOGLOBIN: 13.9 g/dL (ref 13.0–17.1)
LYMPH%: 29.5 % (ref 14.0–49.0)
MCH: 28.9 pg (ref 27.2–33.4)
MCHC: 33.5 g/dL (ref 32.0–36.0)
MCV: 86.3 fL (ref 79.3–98.0)
MONO#: 0.4 10*3/uL (ref 0.1–0.9)
MONO%: 7.3 % (ref 0.0–14.0)
NEUT#: 3 10*3/uL (ref 1.5–6.5)
NEUT%: 60.4 % (ref 39.0–75.0)
Platelets: 190 10*3/uL (ref 140–400)
RBC: 4.81 10*6/uL (ref 4.20–5.82)
RDW: 13.8 % (ref 11.0–14.6)
WBC: 4.9 10*3/uL (ref 4.0–10.3)
lymph#: 1.5 10*3/uL (ref 0.9–3.3)

## 2016-09-07 LAB — COMPREHENSIVE METABOLIC PANEL
ALBUMIN: 4.1 g/dL (ref 3.5–5.0)
ALK PHOS: 62 U/L (ref 40–150)
ALT: 18 U/L (ref 0–55)
AST: 20 U/L (ref 5–34)
Anion Gap: 10 mEq/L (ref 3–11)
BUN: 16.7 mg/dL (ref 7.0–26.0)
CALCIUM: 9.2 mg/dL (ref 8.4–10.4)
CHLORIDE: 104 meq/L (ref 98–109)
CO2: 24 mEq/L (ref 22–29)
Creatinine: 0.9 mg/dL (ref 0.7–1.3)
GLUCOSE: 136 mg/dL (ref 70–140)
POTASSIUM: 3.8 meq/L (ref 3.5–5.1)
SODIUM: 138 meq/L (ref 136–145)
Total Bilirubin: 0.33 mg/dL (ref 0.20–1.20)
Total Protein: 7.2 g/dL (ref 6.4–8.3)

## 2016-09-07 LAB — TSH: TSH: 2.618 m(IU)/L (ref 0.320–4.118)

## 2016-09-07 MED ORDER — OXYCODONE HCL 15 MG PO TABS: 15.0000 mg | ORAL_TABLET | ORAL | 0 refills | Status: DC | PRN

## 2016-09-07 MED ORDER — SODIUM CHLORIDE 0.9 % IV SOLN
Freq: Once | INTRAVENOUS | Status: AC
Start: 2016-09-07 — End: 2016-09-07
  Administered 2016-09-07: 15:00:00 via INTRAVENOUS

## 2016-09-07 MED ORDER — ANTICOAGULANT SODIUM CITRATE 4% (200MG/5ML) IV SOLN
5.0000 mL | Freq: Once | Status: AC
  Administered 2016-09-07: 5 mL via INTRAVENOUS
  Filled 2016-09-07: qty 5

## 2016-09-07 MED ORDER — NIVOLUMAB CHEMO INJECTION 100 MG/10ML
240.0000 mg | Freq: Once | INTRAVENOUS | Status: AC
  Administered 2016-09-07: 240 mg via INTRAVENOUS
  Filled 2016-09-07: qty 4

## 2016-09-07 MED ORDER — SODIUM CHLORIDE 0.9 % IJ SOLN
10.0000 mL | INTRAMUSCULAR | Status: DC | PRN
  Administered 2016-09-07: 10 mL
  Filled 2016-09-07: qty 10

## 2016-09-07 MED ORDER — SODIUM CHLORIDE 0.9 % IJ SOLN
10.0000 mL | INTRAMUSCULAR | Status: AC | PRN
  Administered 2016-09-07: 10 mL
  Filled 2016-09-07: qty 10

## 2016-09-07 NOTE — Patient Instructions (Signed)
[facility] Discharge Instructions for Patients Receiving Chemotherapy  Today you received the following chemotherapy agents Nivolumab.  To help prevent nausea and vomiting after your treatment, we encourage you to take your nausea medication as prescribed.   If you develop nausea and vomiting that is not controlled by your nausea medication, call the clinic.   BELOW ARE SYMPTOMS THAT SHOULD BE REPORTED IMMEDIATELY:  *FEVER GREATER THAN 100.5 F  *CHILLS WITH OR WITHOUT FEVER  NAUSEA AND VOMITING THAT IS NOT CONTROLLED WITH YOUR NAUSEA MEDICATION  *UNUSUAL SHORTNESS OF BREATH  *UNUSUAL BRUISING OR BLEEDING  TENDERNESS IN MOUTH AND THROAT WITH OR WITHOUT PRESENCE OF ULCERS  *URINARY PROBLEMS  *BOWEL PROBLEMS  UNUSUAL RASH Items with * indicate a potential emergency and should be followed up as soon as possible.  Feel free to call the clinic you have any questions or concerns. The clinic phone number is [phone].  Please show the CHEMO ALERT CARD at check-in to the Emergency Department and triage nurse.   

## 2016-09-07 NOTE — Assessment & Plan Note (Signed)
He is not symptomatic. Observe only CT chest in September 2017 showed no evidence of disease progression

## 2016-09-07 NOTE — Assessment & Plan Note (Signed)
He had excellent response to treatment. CT scan in September 2017 showed excellent response to treatment We will continue Nivolumab every 4 weeks and I plan to order a CT scan of the neck and chest with IV contrast again next month

## 2016-09-07 NOTE — Telephone Encounter (Signed)
Appointments scheduled per 12/4 LOS. Patient given AVS report and calendars with future scheduled appointments.

## 2016-09-07 NOTE — Assessment & Plan Note (Signed)
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 I recommend continue conservative management with physical therapy, topical lidocaine and pain medicine as needed 

## 2016-09-07 NOTE — Progress Notes (Signed)
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 Pan-endoscopy elsewhere showed cancer from nasopharyngeal space.      01/04/2007 - 02/20/2007 Chemotherapy    He received 2 cycles of cisplatin and 5FU followed by concurrent chemo with weekly cisplatin and radiation. He only received 2 doses of chemo due to severe mucositis, nausea and weight loss.      04/05/2007 - 08/04/2007 Chemotherapy    He received 4 more courses of cisplatin with 5FU and had complete response      07/05/2009 Procedure    Fine-needle aspirate of the right level II lymph nodes come from recurrent metastatic disease. Repeat endoscopy and CT scan show no evidence of disease elsewhere.      07/08/2009 - 12/02/2009 Chemotherapy    He was given 6 cycles of carboplatin, 5-FU and docetaxel      12/03/2009 Surgery    He has surgery to the residual lymph node on the right neck which showed no evidence of disease.      02/22/2012 Imaging    Repeat imaging study showed large recurrent mass. He was referred  elsewhere for further treatment.      05/03/2012 Surgery    He underwent left upper lobectomy.      04/29/2013 Imaging    PEt scan showed lesion on right level II B and lower lung was abnormal      06/03/2013 - 02/02/2014 Chemotherapy    He had 6 cycles of chemotherapy when he was found to have recurrence of cancer and had received oxaliplatin and capecitabine      06/07/2014 Imaging    PET CT scan showed persistent disease in the right neck lymph nodes and left lung      06/29/2014 Procedure    Accession: [id number] repeat LUL biopsy confirmed metastatic cancer      07/18/2014 - 07/31/2014 Radiation Therapy    He received palliative radiation therapy to the lungs      10/10/2014 Imaging    CT scan of the chest, abdomen and pelvis show regression in the size of the lung nodule in the left upper lobe and stable pulmonary nodules      01/24/2015 Imaging    CT scan showed stable disease in neck and lung      06/19/2015 Imaging    CT scan of the neck and the chest show possible mild progression of the nodule in the right side of the neck.      06/25/2015 Imaging    PET scan confirmed disease recurrence in the neck  07/07/2015 Imaging    He had MRI neck at [facility]      09/03/2015 -  Chemotherapy    He received palliative chemo with Nivolumab      10/29/2015 Imaging    PET CT showed positive response to Rx      02/28/2016 Imaging    Ct abdomen showed abnormal thinkening in his stomach      03/03/2016 Imaging    CT: Right sternocleidomastoid muscle metastasis appears less distinct but otherwise not significantly changed in size or configuration since 06/19/2015.2. Left level 3 lymph node which was hypermetabolic by PET-CT in January 2017 appears slightly smaller      04/01/2016 Imaging    CT cervical spine showed no acute fracture or traumatic malalignment in the cervical spine      04/22/2016 Procedure    Port-a-cath placed.      06/16/2016 Imaging    Ct neck showed  right sternocleidomastoid muscle metastasis is further decreased in conspicuity since May, and has mildly decreased in size since September 2016. Continued stability of sub-centimeter left cervical lymph nodes. No new or progressive metastatic disease in the neck.      06/16/2016 Imaging    CT chest showed stable masslike radiation fibrosis in the left upper lobe. Stable subcentimeter pulmonary nodules in the bilateral lower lobes. No new or progressive metastatic disease in the chest. Nonobstructing left renal stone.       INTERVAL HISTORY: Please see below for problem oriented charting. He returns for further follow-up. He missed his recent appointment due to scheduling issue. He continues to have intermittent neck pain, stable. Denies recent chest pain nausea most of breath. Denies recent infection  REVIEW OF SYSTEMS:   Constitutional: Denies fevers, chills or abnormal weight loss Eyes: Denies blurriness of vision Ears, nose, mouth, throat, and face: Denies mucositis or sore throat Respiratory: Denies cough, dyspnea or wheezes Cardiovascular: Denies palpitation, chest discomfort or lower extremity swelling Gastrointestinal:  Denies nausea, heartburn or change in bowel habits Skin: Denies abnormal skin rashes Lymphatics: Denies new lymphadenopathy or easy bruising Neurological:Denies numbness, tingling or new weaknesses Behavioral/Psych: Mood is stable, no new changes  All other systems were reviewed with the patient and are negative.  I have reviewed the past medical history, past surgical history, social history and family history with the patient and they are unchanged from previous note.  ALLERGIES:  is allergic to heparin.  MEDICATIONS:  Current Outpatient Prescriptions  Medication Sig Dispense Refill  . levothyroxine (SYNTHROID, LEVOTHROID) 75 MCG tablet TAKE 1 TABLET BY MOUTH EVERY DAY BEFORE BREAKFAST 90 tablet 0  . lidocaine-prilocaine (EMLA) cream Apply 1 application  topically as needed. 30 g 3  . oxyCODONE (ROXICODONE) 15 MG immediate release tablet Take 1 tablet (15 mg total) by mouth every 4 (four) hours as needed for pain. 60 tablet 0  . omeprazole (PRILOSEC) 20 MG capsule TAKE ONE CAPSULE BY MOUTH TWICE DAILY BEFORE A MEAL (Patient not taking: Reported on 09/07/2016) 600 capsule 1  . ondansetron (ZOFRAN) 8 MG tablet Take 1 tablet (8 mg total) by mouth every 8 (eight) hours as needed for nausea or vomiting. (Patient not taking: Reported on 09/07/2016) 90 tablet 1   No current facility-administered medications for this visit.    Facility-Administered Medications Ordered in Other Visits  Medication Dose Route Frequency Provider Last Rate Last Dose  . 0.9 %  sodium chloride infusion   Intravenous Once Christian Lark, MD      . anticoagulant sodium citrate solution  5 mL  5 mL Intravenous Once Christian Lark, MD      . nivolumab (OPDIVO) 240 mg in sodium chloride 0.9 % 100 mL chemo infusion  240 mg Intravenous Once Christian Lark, MD      . sodium chloride 0.9 % injection 10 mL  10 mL Intracatheter PRN Christian Lark, MD      . sodium chloride 0.9 % injection 10 mL  10 mL Intracatheter PRN Christian Lark, MD        PHYSICAL EXAMINATION: ECOG PERFORMANCE STATUS: 1 - Symptomatic but completely ambulatory  Vitals:   09/07/16 1353  BP: 121/73  Pulse: 66  Resp: 18  Temp: 98 F (36.7 C)   Filed Weights   09/07/16 1353  Weight: 161 lb 11.2 oz (73.3 kg)    GENERAL:alert, no distress and comfortable SKIN: skin color, texture, turgor are normal, no rashes or significant lesions EYES: normal, Conjunctiva are pink and non-injected, sclera clear OROPHARYNX:no exudate, no erythema and lips, buccal mucosa, and tongue normal  NECK: He has significant scarring on his neck from prior surgery LYMPH:  no palpable lymphadenopathy in the cervical, axillary or inguinal LUNGS: clear to auscultation and percussion with normal breathing effort HEART: regular rate & rhythm and no murmurs  and no lower extremity edema ABDOMEN:abdomen soft, non-tender and normal bowel sounds Musculoskeletal:no cyanosis of digits and no clubbing  NEURO: alert & oriented x 3 with fluent speech, no focal motor/sensory deficits  LABORATORY DATA:  I have reviewed the data as listed    Component Value Date/Time   NA 138 09/07/2016 1306   K 3.8 09/07/2016 1306   CL 104 06/29/2014 0655   CO2 24 09/07/2016 1306   GLUCOSE 136 09/07/2016 1306   BUN 16.7 09/07/2016 1306   CREATININE 0.9 09/07/2016 1306   CALCIUM 9.2 09/07/2016 1306   PROT 7.2 09/07/2016 1306   ALBUMIN 4.1 09/07/2016 1306   AST 20 09/07/2016 1306   ALT 18 09/07/2016 1306   ALKPHOS 62 09/07/2016 1306   BILITOT 0.33 09/07/2016 1306   GFRNONAA >90 06/29/2014 0655   GFRAA >90 06/29/2014 0655    No results found for: SPEP, UPEP  Lab Results  Component Value Date   WBC 4.9 09/07/2016   NEUTROABS 3.0 09/07/2016   HGB 13.9 09/07/2016   HCT 41.5 09/07/2016   MCV 86.3 09/07/2016   PLT 190 09/07/2016      Chemistry      Component Value Date/Time   NA 138 09/07/2016 1306   K 3.8 09/07/2016 1306   CL 104 06/29/2014 0655   CO2 24 09/07/2016 1306   BUN 16.7 09/07/2016 1306   CREATININE 0.9 09/07/2016 1306      Component Value Date/Time   CALCIUM 9.2 09/07/2016 1306   ALKPHOS 62 09/07/2016 1306   AST 20 09/07/2016 1306   ALT 18 09/07/2016 1306   BILITOT 0.33 09/07/2016 1306      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 CT scan in September 2017 showed excellent response to treatment We will continue Nivolumab every 4 weeks and I plan to order a CT scan of the neck and chest with IV contrast again next month  Metastasis to lung He is not symptomatic. Observe only CT chest in September 2017 showed no evidence of disease progression  Neck pain on right side The patient has significant fibrosis and scar tissue from prior surgery and radiation. I recommend  continue conservative management  with physical therapy, topical lidocaine and pain medicine as needed   Orders Placed This Encounter  Procedures  . CT CHEST W CONTRAST    Standing Status:   Future    Standing Expiration Date:   10/12/2017    Order Specific Question:   Reason for exam:    Answer:   nasopharyngeal ca with mets to lung, exclude recurrence    Order Specific Question:   Preferred imaging location?    Answer:   [facility]  . CT Soft Tissue Neck W Contrast    Standing Status:   Future    Standing Expiration Date:   09/07/2017    Order Specific Question:   If indicated for the ordered procedure, I authorize the administration of contrast media per Radiology protocol    Answer:   Yes    Order Specific Question:   Reason for Exam (SYMPTOM  OR DIAGNOSIS REQUIRED)    Answer:   nasopharyngeal ca with mets to lung, exclude recurrence    Order Specific Question:   Preferred imaging location?    Answer:   [facility]   All questions were answered. The patient knows to call the clinic with any problems, questions or concerns. No barriers to learning was detected. I spent 20 minutes counseling the patient face to face. The total time spent in the appointment was 25 minutes and more than 50% was on counseling and review of test results     Christian Lark, MD 09/07/2016 3:20 PM

## 2016-09-08 ENCOUNTER — Telehealth: Payer: Self-pay | Admitting: *Deleted

## 2016-09-08 NOTE — Telephone Encounter (Signed)
Per LOS I have scheduled appts and notified the scheduler 

## 2016-09-21 ENCOUNTER — Ambulatory Visit: Payer: Medicaid Other

## 2016-09-22 ENCOUNTER — Telehealth: Payer: Self-pay

## 2016-09-22 ENCOUNTER — Telehealth: Payer: Self-pay | Admitting: *Deleted

## 2016-09-22 NOTE — Telephone Encounter (Signed)
That can be used for vertigo or nausea

## 2016-09-22 NOTE — Telephone Encounter (Signed)
Pt notified that promethazine can be used for vertigo or nausea.

## 2016-09-22 NOTE — Telephone Encounter (Signed)
Pt is asking if the promethazine he has is what Dr Alvy Bimler prescribed for vertigo.

## 2016-10-07 ENCOUNTER — Ambulatory Visit (HOSPITAL_COMMUNITY): Payer: Medicaid Other

## 2016-10-08 ENCOUNTER — Encounter: Payer: Self-pay | Admitting: Hematology and Oncology

## 2016-10-08 ENCOUNTER — Ambulatory Visit (HOSPITAL_BASED_OUTPATIENT_CLINIC_OR_DEPARTMENT_OTHER): Payer: Medicaid Other

## 2016-10-08 ENCOUNTER — Ambulatory Visit: Payer: Medicaid Other

## 2016-10-08 ENCOUNTER — Other Ambulatory Visit (HOSPITAL_BASED_OUTPATIENT_CLINIC_OR_DEPARTMENT_OTHER): Payer: Medicaid Other

## 2016-10-08 ENCOUNTER — Ambulatory Visit (HOSPITAL_BASED_OUTPATIENT_CLINIC_OR_DEPARTMENT_OTHER): Payer: Medicaid Other | Admitting: Hematology and Oncology

## 2016-10-08 DIAGNOSIS — Z5111 Encounter for antineoplastic chemotherapy: Secondary | ICD-10-CM

## 2016-10-08 DIAGNOSIS — I878 Other specified disorders of veins: Secondary | ICD-10-CM

## 2016-10-08 DIAGNOSIS — M542 Cervicalgia: Secondary | ICD-10-CM

## 2016-10-08 DIAGNOSIS — C7800 Secondary malignant neoplasm of unspecified lung: Secondary | ICD-10-CM

## 2016-10-08 DIAGNOSIS — C760 Malignant neoplasm of head, face and neck: Secondary | ICD-10-CM

## 2016-10-08 DIAGNOSIS — E039 Hypothyroidism, unspecified: Secondary | ICD-10-CM | POA: Diagnosis not present

## 2016-10-08 LAB — CBC WITH DIFFERENTIAL/PLATELET
BASO%: 0.2 % (ref 0.0–2.0)
Basophils Absolute: 0 10*3/uL (ref 0.0–0.1)
EOS ABS: 0.1 10*3/uL (ref 0.0–0.5)
EOS%: 1.9 % (ref 0.0–7.0)
HCT: 42.6 % (ref 38.4–49.9)
HEMOGLOBIN: 14.4 g/dL (ref 13.0–17.1)
LYMPH%: 28.4 % (ref 14.0–49.0)
MCH: 29.4 pg (ref 27.2–33.4)
MCHC: 33.8 g/dL (ref 32.0–36.0)
MCV: 87.1 fL (ref 79.3–98.0)
MONO#: 0.3 10*3/uL (ref 0.1–0.9)
MONO%: 5.6 % (ref 0.0–14.0)
NEUT%: 63.9 % (ref 39.0–75.0)
NEUTROS ABS: 3.3 10*3/uL (ref 1.5–6.5)
PLATELETS: 217 10*3/uL (ref 140–400)
RBC: 4.89 10*6/uL (ref 4.20–5.82)
RDW: 14.1 % (ref 11.0–14.6)
WBC: 5.1 10*3/uL (ref 4.0–10.3)
lymph#: 1.5 10*3/uL (ref 0.9–3.3)

## 2016-10-08 LAB — COMPREHENSIVE METABOLIC PANEL
ALBUMIN: 4.3 g/dL (ref 3.5–5.0)
ALK PHOS: 62 U/L (ref 40–150)
ALT: 24 U/L (ref 0–55)
AST: 22 U/L (ref 5–34)
Anion Gap: 12 mEq/L — ABNORMAL HIGH (ref 3–11)
BILIRUBIN TOTAL: 0.52 mg/dL (ref 0.20–1.20)
BUN: 15.9 mg/dL (ref 7.0–26.0)
CO2: 23 mEq/L (ref 22–29)
CREATININE: 1 mg/dL (ref 0.7–1.3)
Calcium: 9.7 mg/dL (ref 8.4–10.4)
Chloride: 105 mEq/L (ref 98–109)
GLUCOSE: 135 mg/dL (ref 70–140)
Potassium: 4 mEq/L (ref 3.5–5.1)
SODIUM: 140 meq/L (ref 136–145)
Total Protein: 7.1 g/dL (ref 6.4–8.3)

## 2016-10-08 LAB — TSH: TSH: 2.692 m(IU)/L (ref 0.320–4.118)

## 2016-10-08 MED ORDER — SODIUM CHLORIDE 0.9 % IJ SOLN
10.0000 mL | INTRAMUSCULAR | Status: DC | PRN
  Administered 2016-10-08: 10 mL
  Filled 2016-10-08: qty 10

## 2016-10-08 MED ORDER — ANTICOAGULANT SODIUM CITRATE 4% (200MG/5ML) IV SOLN
5.0000 mL | Status: DC | PRN
  Administered 2016-10-08: 5 mL via INTRAVENOUS
  Filled 2016-10-08: qty 5

## 2016-10-08 MED ORDER — SODIUM CHLORIDE 0.9 % IV SOLN
Freq: Once | INTRAVENOUS | Status: AC
  Administered 2016-10-08: 13:00:00 via INTRAVENOUS

## 2016-10-08 MED ORDER — LIDOCAINE-PRILOCAINE 2.5-2.5 % EX CREA: 1.0000 "application " | TOPICAL_CREAM | CUTANEOUS | 3 refills | Status: DC | PRN

## 2016-10-08 MED ORDER — SODIUM CHLORIDE 0.9 % IV SOLN
240.0000 mg | Freq: Once | INTRAVENOUS | Status: AC
  Administered 2016-10-08: 240 mg via INTRAVENOUS
  Filled 2016-10-08: qty 20

## 2016-10-08 MED ORDER — SODIUM CHLORIDE 0.9 % IJ SOLN
10.0000 mL | INTRAMUSCULAR | Status: AC | PRN
  Administered 2016-10-08: 10 mL
  Filled 2016-10-08: qty 10

## 2016-10-08 NOTE — Assessment & Plan Note (Signed)
He had excellent response to treatment. CT scan in September 2017 showed excellent response to treatment We will continue Nivolumab every 3-4 weeks and I plan to order a CT scan of the neck and chest with IV contrast again next month to assess response to Rx

## 2016-10-08 NOTE — Patient Instructions (Signed)
[facility] Discharge Instructions for Patients Receiving Chemotherapy  Today you received the following chemotherapy agents Nivolumab.  To help prevent nausea and vomiting after your treatment, we encourage you to take your nausea medication as prescribed.   If you develop nausea and vomiting that is not controlled by your nausea medication, call the clinic.   BELOW ARE SYMPTOMS THAT SHOULD BE REPORTED IMMEDIATELY:  *FEVER GREATER THAN 100.5 F  *CHILLS WITH OR WITHOUT FEVER  NAUSEA AND VOMITING THAT IS NOT CONTROLLED WITH YOUR NAUSEA MEDICATION  *UNUSUAL SHORTNESS OF BREATH  *UNUSUAL BRUISING OR BLEEDING  TENDERNESS IN MOUTH AND THROAT WITH OR WITHOUT PRESENCE OF ULCERS  *URINARY PROBLEMS  *BOWEL PROBLEMS  UNUSUAL RASH Items with * indicate a potential emergency and should be followed up as soon as possible.  Feel free to call the clinic you have any questions or concerns. The clinic phone number is [phone].  Please show the CHEMO ALERT CARD at check-in to the Emergency Department and triage nurse.   

## 2016-10-08 NOTE — Assessment & Plan Note (Signed)
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 He is compliant taking his thyroid medicine. I reinforced the importance of taking his thyroid replacement therapy regularly. TSH checked recently showed adequate replacement

## 2016-10-08 NOTE — Assessment & Plan Note (Signed)
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 I recommend continue conservative management with physical therapy, topical lidocaine and pain medicine as needed 

## 2016-10-08 NOTE — Assessment & Plan Note (Signed)
He is not symptomatic. Observe only CT chest in September 2017 showed no evidence of disease progression I plan to repeat CT chest again

## 2016-10-08 NOTE — Progress Notes (Signed)
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 Pan-endoscopy elsewhere showed cancer from nasopharyngeal space.      01/04/2007 - 02/20/2007 Chemotherapy    He received 2 cycles of cisplatin and 5FU followed by concurrent chemo with weekly cisplatin and radiation. He only received 2 doses of chemo due to severe mucositis, nausea and weight loss.      04/05/2007 - 08/04/2007 Chemotherapy    He received 4 more courses of cisplatin with 5FU and had complete response      07/05/2009 Procedure    Fine-needle aspirate of the right level II lymph nodes come from recurrent metastatic disease. Repeat endoscopy and CT scan show no evidence of disease elsewhere.      07/08/2009 - 12/02/2009 Chemotherapy    He was given 6 cycles of carboplatin, 5-FU and docetaxel      12/03/2009 Surgery    He has surgery to the residual lymph node on the right neck which showed no evidence of disease.      02/22/2012 Imaging    Repeat imaging study showed large recurrent mass. He was referred  elsewhere for further treatment.      05/03/2012 Surgery    He underwent left upper lobectomy.      04/29/2013 Imaging    PEt scan showed lesion on right level II B and lower lung was abnormal      06/03/2013 - 02/02/2014 Chemotherapy    He had 6 cycles of chemotherapy when he was found to have recurrence of cancer and had received oxaliplatin and capecitabine      06/07/2014 Imaging    PET CT scan showed persistent disease in the right neck lymph nodes and left lung      06/29/2014 Procedure    Accession: [id number] repeat LUL biopsy confirmed metastatic cancer      07/18/2014 - 07/31/2014 Radiation Therapy    He received palliative radiation therapy to the lungs      10/10/2014 Imaging    CT scan of the chest, abdomen and pelvis show regression in the size of the lung nodule in the left upper lobe and stable pulmonary nodules      01/24/2015 Imaging    CT scan showed stable disease in neck and lung      06/19/2015 Imaging    CT scan of the neck and the chest show possible mild progression of the nodule in the right side of the neck.      06/25/2015 Imaging    PET scan confirmed disease recurrence in the neck  07/07/2015 Imaging    He had MRI neck at [facility]      09/03/2015 -  Chemotherapy    He received palliative chemo with Nivolumab      10/29/2015 Imaging    PET CT showed positive response to Rx      02/28/2016 Imaging    Ct abdomen showed abnormal thinkening in his stomach      03/03/2016 Imaging    CT: Right sternocleidomastoid muscle metastasis appears less distinct but otherwise not significantly changed in size or configuration since 06/19/2015.2. Left level 3 lymph node which was hypermetabolic by PET-CT in January 2017 appears slightly smaller      04/01/2016 Imaging    CT cervical spine showed no acute fracture or traumatic malalignment in the cervical spine      04/22/2016 Procedure    Port-a-cath placed.      06/16/2016 Imaging    Ct neck showed  right sternocleidomastoid muscle metastasis is further decreased in conspicuity since May, and has mildly decreased in size since September 2016. Continued stability of sub-centimeter left cervical lymph nodes. No new or progressive metastatic disease in the neck.      06/16/2016 Imaging    CT chest showed stable masslike radiation fibrosis in the left upper lobe. Stable subcentimeter pulmonary nodules in the bilateral lower lobes. No new or progressive metastatic disease in the chest. Nonobstructing left renal stone.       INTERVAL HISTORY: Please see below for problem oriented charting. He returns prior to treatment He continues to have chronic right neck pain and he is not taking much pain medications No recent neuropathy No recent cough, chest pain or short of breath No recent infections  REVIEW OF SYSTEMS:   Constitutional: Denies fevers, chills or abnormal weight loss Eyes: Denies blurriness of vision Ears, nose, mouth, throat, and face: Denies mucositis or sore throat Respiratory: Denies cough, dyspnea or wheezes Cardiovascular: Denies palpitation, chest discomfort or lower extremity swelling Gastrointestinal:  Denies nausea, heartburn or change in bowel habits Skin: Denies abnormal skin rashes Lymphatics: Denies new lymphadenopathy or easy bruising Neurological:Denies numbness, tingling or new weaknesses Behavioral/Psych: Mood is stable, no new changes  All other systems were reviewed with the patient and are negative.  I have reviewed the past medical history, past surgical history, social history and family history with the patient and they are unchanged from previous note.  ALLERGIES:  is allergic to heparin.  MEDICATIONS:  Current Outpatient Prescriptions  Medication Sig Dispense Refill  . ibuprofen (ADVIL,MOTRIN) 800 MG tablet TK 1 T PO Q 8 H WF OR MILK  0  . levothyroxine (SYNTHROID, LEVOTHROID) 100 MCG tablet   2  . lidocaine-prilocaine (EMLA) cream Apply 1  application topically as needed. 30 g 3  . ondansetron (ZOFRAN) 8 MG tablet Take 1 tablet (8 mg total) by mouth every 8 (eight) hours as needed for nausea or vomiting. 90 tablet 1  . oxyCODONE (ROXICODONE) 15 MG immediate release tablet Take 1 tablet (15 mg total) by mouth every 4 (four) hours as needed for pain. 60 tablet 0   No current facility-administered medications for this visit.    Facility-Administered Medications Ordered in Other Visits  Medication Dose Route Frequency Provider Last Rate Last Dose  . sodium chloride 0.9 % injection 10 mL  10 mL Intracatheter PRN [name], MD        PHYSICAL EXAMINATION: ECOG PERFORMANCE STATUS: 1 - Symptomatic but completely ambulatory  Vitals:   10/08/16 1131  BP: 110/62  Pulse: 68  Resp: 18  Temp: 98.6 F (37 C)   Filed Weights   10/08/16 1131  Weight: 163 lb 6.4 oz (74.1 kg)    GENERAL:alert, no distress and comfortable SKIN: skin color, texture, turgor are normal, no rashes or significant lesions EYES: normal, Conjunctiva are pink and non-injected, sclera clear OROPHARYNX:no exudate, no erythema and lips, buccal mucosa, and tongue normal  NECK: Significant surgical scars and fibrosis from prior therapy LYMPH:  no palpable lymphadenopathy in the cervical, axillary or inguinal LUNGS: clear to auscultation and percussion with normal breathing effort HEART: regular rate & rhythm and no murmurs and no lower extremity edema ABDOMEN:abdomen soft, non-tender and normal bowel sounds Musculoskeletal:no cyanosis of digits and no clubbing  NEURO: alert & oriented x 3 with fluent speech, no focal motor/sensory deficits  LABORATORY DATA:  I have reviewed the data as listed    Component Value Date/Time   NA 140 10/08/2016 1106   K 4.0 10/08/2016 1106   CL 104 06/29/2014 0655   CO2 23 10/08/2016 1106   GLUCOSE 135 10/08/2016 1106   BUN 15.9 10/08/2016 1106   CREATININE 1.0 10/08/2016 1106   CALCIUM 9.7 10/08/2016 1106   PROT 7.1  10/08/2016 1106   ALBUMIN 4.3 10/08/2016 1106   AST 22 10/08/2016 1106   ALT 24 10/08/2016 1106   ALKPHOS 62 10/08/2016 1106   BILITOT 0.52 10/08/2016 1106   GFRNONAA >90 06/29/2014 0655   GFRAA >90 06/29/2014 0655    No results found for: SPEP, UPEP  Lab Results  Component Value Date   WBC 5.1 10/08/2016   NEUTROABS 3.3 10/08/2016   HGB 14.4 10/08/2016   HCT 42.6 10/08/2016   MCV 87.1 10/08/2016   PLT 217 10/08/2016      Chemistry      Component Value Date/Time   NA 140 10/08/2016 1106   K 4.0 10/08/2016 1106   CL 104 06/29/2014 0655   CO2 23 10/08/2016 1106   BUN 15.9 10/08/2016 1106   CREATININE 1.0 10/08/2016 1106      Component Value Date/Time   CALCIUM 9.7 10/08/2016 1106   ALKPHOS 62 10/08/2016 1106   AST 22 10/08/2016 1106   ALT 24 10/08/2016 1106   BILITOT 0.52 10/08/2016 1106       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[place]) He had excellent response to treatment. CT scan in September 2017 showed excellent response to treatment We will continue Nivolumab every 3-4 weeks and I plan to order a CT scan of the neck and chest with IV contrast again next month to assess response to Rx  Metastasis to lung He is not symptomatic. Observe only CT chest in September 2017 showed no evidence of disease progression I plan to repeat CT chest again  Acquired hypothyroidism He has acquired hypothyroidism from prior radiation and surgery to his neck. He is compliant taking his thyroid medicine. I reinforced the importance of taking his thyroid replacement therapy regularly. TSH checked recently showed adequate replacement  Neck pain on right side The patient has significant fibrosis and scar tissue from prior surgery and radiation. I recommend continue conservative management with physical therapy, topical lidocaine and pain medicine as needed   No orders of the defined types were placed in this encounter.  All questions were answered. The  patient knows to call the clinic with any problems, questions or concerns. No barriers to learning was detected. I spent 20 minutes counseling the patient face to face.  The total time spent in the appointment was 30 minutes and more than 50% was on counseling and review of test results     [name], MD 10/08/2016 9:04 PM

## 2016-10-13 ENCOUNTER — Telehealth: Payer: Self-pay | Admitting: Hematology and Oncology

## 2016-10-13 ENCOUNTER — Ambulatory Visit (HOSPITAL_COMMUNITY)
Admission: RE | Admit: 2016-10-13 | Discharge: 2016-10-13 | Disposition: A | Discharge status: Home / Self Care | Payer: Medicaid Other | Source: Ambulatory Visit | Attending: Hematology and Oncology | Admitting: Hematology and Oncology

## 2016-10-13 ENCOUNTER — Encounter (HOSPITAL_COMMUNITY): Payer: Self-pay

## 2016-10-13 DIAGNOSIS — R918 Other nonspecific abnormal finding of lung field: Secondary | ICD-10-CM | POA: Insufficient documentation

## 2016-10-13 DIAGNOSIS — C7989 Secondary malignant neoplasm of other specified sites: Secondary | ICD-10-CM | POA: Insufficient documentation

## 2016-10-13 DIAGNOSIS — C760 Malignant neoplasm of head, face and neck: Secondary | ICD-10-CM | POA: Diagnosis present

## 2016-10-13 MED ORDER — IOPAMIDOL (ISOVUE-300) INJECTION 61%
100.0000 mL | Freq: Once | INTRAVENOUS | Status: AC | PRN
  Administered 2016-10-13: 80 mL via INTRAVENOUS

## 2016-10-13 MED ORDER — IOPAMIDOL (ISOVUE-300) INJECTION 61%
INTRAVENOUS | Status: AC
  Filled 2016-10-13: qty 100

## 2016-10-13 NOTE — Telephone Encounter (Signed)
I spoke with the patient and confirmed the 1/29 appointments

## 2016-11-02 ENCOUNTER — Other Ambulatory Visit (HOSPITAL_BASED_OUTPATIENT_CLINIC_OR_DEPARTMENT_OTHER): Payer: Medicaid Other

## 2016-11-02 ENCOUNTER — Encounter: Payer: Self-pay | Admitting: Hematology and Oncology

## 2016-11-02 ENCOUNTER — Ambulatory Visit: Payer: Medicaid Other

## 2016-11-02 ENCOUNTER — Ambulatory Visit (HOSPITAL_BASED_OUTPATIENT_CLINIC_OR_DEPARTMENT_OTHER): Payer: Medicaid Other

## 2016-11-02 ENCOUNTER — Telehealth: Payer: Self-pay | Admitting: Hematology and Oncology

## 2016-11-02 ENCOUNTER — Ambulatory Visit (HOSPITAL_BASED_OUTPATIENT_CLINIC_OR_DEPARTMENT_OTHER): Payer: Medicaid Other | Admitting: Hematology and Oncology

## 2016-11-02 ENCOUNTER — Telehealth: Payer: Self-pay | Admitting: *Deleted

## 2016-11-02 DIAGNOSIS — R42 Dizziness and giddiness: Secondary | ICD-10-CM | POA: Diagnosis not present

## 2016-11-02 DIAGNOSIS — C760 Malignant neoplasm of head, face and neck: Secondary | ICD-10-CM

## 2016-11-02 DIAGNOSIS — C7989 Secondary malignant neoplasm of other specified sites: Secondary | ICD-10-CM | POA: Diagnosis not present

## 2016-11-02 DIAGNOSIS — E039 Hypothyroidism, unspecified: Secondary | ICD-10-CM | POA: Diagnosis not present

## 2016-11-02 DIAGNOSIS — C7800 Secondary malignant neoplasm of unspecified lung: Secondary | ICD-10-CM

## 2016-11-02 DIAGNOSIS — Z5112 Encounter for antineoplastic immunotherapy: Secondary | ICD-10-CM

## 2016-11-02 DIAGNOSIS — I878 Other specified disorders of veins: Secondary | ICD-10-CM

## 2016-11-02 LAB — COMPREHENSIVE METABOLIC PANEL
ALBUMIN: 4.2 g/dL (ref 3.5–5.0)
ALK PHOS: 59 U/L (ref 40–150)
ALT: 22 U/L (ref 0–55)
ANION GAP: 11 meq/L (ref 3–11)
AST: 22 U/L (ref 5–34)
BILIRUBIN TOTAL: 0.35 mg/dL (ref 0.20–1.20)
BUN: 15.8 mg/dL (ref 7.0–26.0)
CALCIUM: 9.6 mg/dL (ref 8.4–10.4)
CO2: 24 meq/L (ref 22–29)
CREATININE: 0.9 mg/dL (ref 0.7–1.3)
Chloride: 105 mEq/L (ref 98–109)
EGFR: >90 mL/min/{1.73_m2} (ref >90)
Glucose: 131 mg/dl (ref 70–140)
Potassium: 3.7 mEq/L (ref 3.5–5.1)
Sodium: 140 mEq/L (ref 136–145)
TOTAL PROTEIN: 7.1 g/dL (ref 6.4–8.3)

## 2016-11-02 LAB — CBC WITH DIFFERENTIAL/PLATELET
BASO%: 0.2 % (ref 0.0–2.0)
BASOS ABS: 0 10*3/uL (ref 0.0–0.1)
EOS%: 2.2 % (ref 0.0–7.0)
Eosinophils Absolute: 0.1 10*3/uL (ref 0.0–0.5)
HEMATOCRIT: 42.6 % (ref 38.4–49.9)
HGB: 14.4 g/dL (ref 13.0–17.1)
LYMPH#: 1.3 10*3/uL (ref 0.9–3.3)
LYMPH%: 24.3 % (ref 14.0–49.0)
MCH: 29.4 pg (ref 27.2–33.4)
MCHC: 33.8 g/dL (ref 32.0–36.0)
MCV: 87.1 fL (ref 79.3–98.0)
MONO#: 0.5 10*3/uL (ref 0.1–0.9)
MONO%: 8.6 % (ref 0.0–14.0)
NEUT#: 3.5 10*3/uL (ref 1.5–6.5)
NEUT%: 64.7 % (ref 39.0–75.0)
PLATELETS: 204 10*3/uL (ref 140–400)
RBC: 4.89 10*6/uL (ref 4.20–5.82)
RDW: 14 % (ref 11.0–14.6)
WBC: 5.4 10*3/uL (ref 4.0–10.3)

## 2016-11-02 LAB — TSH: TSH: 3.786 m(IU)/L (ref 0.320–4.118)

## 2016-11-02 MED ORDER — SODIUM CHLORIDE 0.9 % IV SOLN
Freq: Once | INTRAVENOUS | Status: AC
  Administered 2016-11-02: 11:00:00 via INTRAVENOUS

## 2016-11-02 MED ORDER — ANTICOAGULANT SODIUM CITRATE 4% (200MG/5ML) IV SOLN
5.0000 mL | Status: DC | PRN
  Administered 2016-11-02: 5 mL via INTRAVENOUS
  Filled 2016-11-02: qty 5

## 2016-11-02 MED ORDER — SODIUM CHLORIDE 0.9 % IV SOLN
240.0000 mg | Freq: Once | INTRAVENOUS | Status: AC
  Administered 2016-11-02: 240 mg via INTRAVENOUS
  Filled 2016-11-02: qty 20

## 2016-11-02 MED ORDER — SODIUM CHLORIDE 0.9 % IJ SOLN
10.0000 mL | INTRAMUSCULAR | Status: DC | PRN
  Administered 2016-11-02: 10 mL
  Filled 2016-11-02: qty 10

## 2016-11-02 MED ORDER — SODIUM CHLORIDE 0.9 % IJ SOLN
10.0000 mL | INTRAMUSCULAR | Status: AC | PRN
  Administered 2016-11-02: 10 mL
  Filled 2016-11-02: qty 10

## 2016-11-02 NOTE — Patient Instructions (Signed)
[facility] Discharge Instructions for Patients Receiving Chemotherapy  Today you received the following chemotherapy agents:  Opdivo (nivolumab)  To help prevent nausea and vomiting after your treatment, we encourage you to take your nausea medication as prescribed.   If you develop nausea and vomiting that is not controlled by your nausea medication, call the clinic.   BELOW ARE SYMPTOMS THAT SHOULD BE REPORTED IMMEDIATELY:  *FEVER GREATER THAN 100.5 F  *CHILLS WITH OR WITHOUT FEVER  NAUSEA AND VOMITING THAT IS NOT CONTROLLED WITH YOUR NAUSEA MEDICATION  *UNUSUAL SHORTNESS OF BREATH  *UNUSUAL BRUISING OR BLEEDING  TENDERNESS IN MOUTH AND THROAT WITH OR WITHOUT PRESENCE OF ULCERS  *URINARY PROBLEMS  *BOWEL PROBLEMS  UNUSUAL RASH Items with * indicate a potential emergency and should be followed up as soon as possible.  Feel free to call the clinic you have any questions or concerns. The clinic phone number is [phone].  Please show the [facility] at check-in to the Emergency Department and triage nurse.

## 2016-11-02 NOTE — Patient Instructions (Signed)

## 2016-11-02 NOTE — Assessment & Plan Note (Signed)
He has history of intermittent vertigo. I recommend he takes Compazine as needed. He found that was very effective. He had a usually intermittently less than once a month

## 2016-11-02 NOTE — Telephone Encounter (Signed)
Appointments scheduled per 1/29 LOS. Patient had to leave during scheduling to attend infusion appointment. The patient will pick up his printouts in infusion room.

## 2016-11-02 NOTE — Telephone Encounter (Signed)
Per 1/29 LOS and staff message I have scheduled appt and gave calendar. Notified the scheduler

## 2016-11-02 NOTE — Progress Notes (Signed)
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 Pan-endoscopy elsewhere showed cancer from nasopharyngeal space.      01/04/2007 - 02/20/2007 Chemotherapy    He received 2 cycles of cisplatin and 5FU followed by concurrent chemo with weekly cisplatin and radiation. He only received 2 doses of chemo due to severe mucositis, nausea and weight loss.      04/05/2007 - 08/04/2007 Chemotherapy    He received 4 more courses of cisplatin with 5FU and had complete response      07/05/2009 Procedure    Fine-needle aspirate of the right level II lymph nodes come from recurrent metastatic disease. Repeat endoscopy and CT scan show no evidence of disease elsewhere.      07/08/2009 - 12/02/2009 Chemotherapy    He was given 6 cycles of carboplatin, 5-FU and docetaxel      12/03/2009 Surgery    He has surgery to the residual lymph node on the right neck which showed no evidence of disease.      02/22/2012 Imaging    Repeat imaging study showed large recurrent mass. He was referred  elsewhere for further treatment.      05/03/2012 Surgery    He underwent left upper lobectomy.      04/29/2013 Imaging    PEt scan showed lesion on right level II B and lower lung was abnormal      06/03/2013 - 02/02/2014 Chemotherapy    He had 6 cycles of chemotherapy when he was found to have recurrence of cancer and had received oxaliplatin and capecitabine      06/07/2014 Imaging    PET CT scan showed persistent disease in the right neck lymph nodes and left lung      06/29/2014 Procedure    Accession: [id number] repeat LUL biopsy confirmed metastatic cancer      07/18/2014 - 07/31/2014 Radiation Therapy    He received palliative radiation therapy to the lungs      10/10/2014 Imaging    CT scan of the chest, abdomen and pelvis show regression in the size of the lung nodule in the left upper lobe and stable pulmonary nodules      01/24/2015 Imaging    CT scan showed stable disease in neck and lung      06/19/2015 Imaging    CT scan of the neck and the chest show possible mild progression of the nodule in the right side of the neck.      06/25/2015 Imaging    PET scan confirmed disease recurrence in the neck  07/07/2015 Imaging    He had MRI neck at [facility]      09/03/2015 -  Chemotherapy    He received palliative chemo with Nivolumab      10/29/2015 Imaging    PET CT showed positive response to Rx      02/28/2016 Imaging    Ct abdomen showed abnormal thinkening in his stomach      03/03/2016 Imaging    CT: Right sternocleidomastoid muscle metastasis appears less distinct but otherwise not significantly changed in size or configuration since 06/19/2015.2. Left level 3 lymph node which was hypermetabolic by PET-CT in January 2017 appears slightly smaller      04/01/2016 Imaging    CT cervical spine showed no acute fracture or traumatic malalignment in the cervical spine      04/22/2016 Procedure    Port-a-cath placed.      06/16/2016 Imaging    Ct neck showed  right sternocleidomastoid muscle metastasis is further decreased in conspicuity since May, and has mildly decreased in size since September 2016. Continued stability of sub-centimeter left cervical lymph nodes. No new or progressive metastatic disease in the neck.      06/16/2016 Imaging    CT chest showed stable masslike radiation fibrosis in the left upper lobe. Stable subcentimeter pulmonary nodules in the bilateral lower lobes. No new or progressive metastatic disease in the chest. Nonobstructing left renal stone.      10/13/2016 Imaging    Ct neck showed unchanged right sternocleidomastoid muscle metastasis. Unchanged subcentimeter left cervical lymph nodes. No evidence of new or progressive metastatic disease in the neck.      10/13/2016 Imaging    CT chest showed tiny hypervascular foci in the liver, not definitely seen on prior imaging of 06/16/2016 and 02/28/2016. Abdomen MRI without and with contrast recommended to further evaluate as metastatic disease is a concern. 2. Stable appearance of post treatment changes left upper lung and scattered tiny bilateral pulmonary nodules.       INTERVAL HISTORY: Please see below for problem oriented charting. He returns for further follow-up. He feels well. Denies recent chest pain or shortness of breath. He denies side effects from treatment. He has intermittent vertigo  REVIEW OF SYSTEMS:   Constitutional: Denies fevers, chills or abnormal weight loss Eyes: Denies blurriness of vision Ears, nose, mouth, throat, and face: Denies mucositis or sore throat Respiratory: Denies cough, dyspnea or wheezes Cardiovascular: Denies palpitation, chest discomfort or lower extremity swelling Gastrointestinal:  Denies nausea, heartburn or change in bowel habits Skin: Denies abnormal skin rashes Lymphatics: Denies new lymphadenopathy or easy bruising Neurological:Denies numbness, tingling or new weaknesses Behavioral/Psych: Mood is stable, no new  changes  All other systems were reviewed with the patient and are negative.  I have reviewed the past medical history, past surgical history, social history and family history with the patient and they are unchanged from previous note.  ALLERGIES:  is allergic to heparin.  MEDICATIONS:  Current Outpatient Prescriptions  Medication Sig Dispense Refill  . ibuprofen (ADVIL,MOTRIN) 800 MG tablet TK 1 T PO Q 8 H WF OR MILK  0  . levothyroxine (SYNTHROID, LEVOTHROID) 100 MCG tablet   2  . lidocaine-prilocaine (EMLA) cream Apply 1 application topically as needed. 30 g 3  . oxyCODONE (ROXICODONE) 15 MG immediate release tablet Take 1 tablet (15 mg total) by mouth every 4 (four) hours as needed for pain. 60 tablet 0  . ondansetron (ZOFRAN) 8 MG tablet Take 1 tablet (8 mg total) by  mouth every 8 (eight) hours as needed for nausea or vomiting. (Patient not taking: Reported on 11/02/2016) 90 tablet 1   No current facility-administered medications for this visit.    Facility-Administered Medications Ordered in Other Visits  Medication Dose Route Frequency Provider Last Rate Last Dose  . 0.9 %  sodium chloride infusion   Intravenous Once [name], MD      . anticoagulant sodium citrate solution 5 mL  5 mL Intravenous PRN [name], MD      . nivolumab (OPDIVO) 240 mg in sodium chloride 0.9 % 100 mL chemo infusion  240 mg Intravenous Once [name], MD      . sodium chloride 0.9 % injection 10 mL  10 mL Intracatheter PRN Natesha Hassey, MD      . sodium chloride 0.9 % injection 10 mL  10 mL Intracatheter PRN [name], MD        PHYSICAL EXAMINATION: ECOG PERFORMANCE STATUS: 1 - Symptomatic but completely ambulatory  Vitals:   11/02/16 1007  BP: 116/69  Pulse: 76  Resp: 18  Temp: 98.1 F (36.7 C)   Filed Weights   11/02/16 1007  Weight: 165 lb 9.6 oz (75.1 kg)    GENERAL:alert, no distress and comfortable SKIN: skin color, texture, turgor are normal, no rashes or significant  lesions EYES: normal, Conjunctiva are pink and non-injected, sclera clear NEURO: alert & oriented x 3 with fluent speech, no focal motor/sensory deficits  LABORATORY DATA:  I have reviewed the data as listed    Component Value Date/Time   NA 140 11/02/2016 0925   K 3.7 11/02/2016 0925   CL 104 06/29/2014 0655   CO2 24 11/02/2016 0925   GLUCOSE 131 11/02/2016 0925   BUN 15.8 11/02/2016 0925   CREATININE 0.9 11/02/2016 0925   CALCIUM 9.6 11/02/2016 0925   PROT 7.1 11/02/2016 0925   ALBUMIN 4.2 11/02/2016 0925   AST 22 11/02/2016 0925   ALT 22 11/02/2016 0925   ALKPHOS 59 11/02/2016 0925   BILITOT 0.35 11/02/2016 0925   GFRNONAA >90 06/29/2014 0655   GFRAA >90 06/29/2014 0655    No results found for: SPEP, UPEP  Lab Results  Component Value Date   WBC 5.4 11/02/2016   NEUTROABS 3.5 11/02/2016   HGB 14.4 11/02/2016   HCT 42.6 11/02/2016   MCV 87.1 11/02/2016   PLT 204 11/02/2016      Chemistry      Component Value Date/Time   NA 140 11/02/2016 0925   K 3.7 11/02/2016 0925   CL 104 06/29/2014 0655   CO2 24 11/02/2016 0925   BUN 15.8 11/02/2016 0925   CREATININE 0.9 11/02/2016 0925      Component Value Date/Time   CALCIUM 9.6 11/02/2016 0925   ALKPHOS 59 11/02/2016 0925   AST 22 11/02/2016 0925   ALT 22 11/02/2016 0925   BILITOT 0.35 11/02/2016 0925       RADIOGRAPHIC STUDIES: I have personally reviewed the radiological images as listed and agreed with the findings in the report. Ct Soft Tissue Neck W Contrast  Result Date: 10/13/2016 CLINICAL DATA:  Metastatic nasopharyngeal carcinoma. Prior chemotherapy and radiation therapy. Ongoing immunotherapy. EXAM: CT NECK WITH CONTRAST TECHNIQUE: Multidetector CT imaging of the neck was performed using the standard protocol following the bolus administration of intravenous contrast. CONTRAST:  82m ISOVUE-300 IOPAMIDOL (ISOVUE-300) INJECTION 61% COMPARISON:  06/16/2016 FINDINGS: Pharynx and larynx: No pharyngeal mass or  parapharyngeal/ retropharyngeal inflammatory change. Unremarkable larynx. Salivary glands: Absent right  submandibular gland. Post radiation changes in the left submandibular and bilateral parotid glands. Thyroid: Unremarkable. Lymph nodes: Ill-defined, approximately 3 cm right neck mass involving the sternocleidomastoid muscle has not significantly changed an size/extent compared to the prior study. Patchy areas of hyperenhancement within the mass are stable to slightly more conspicuous than on the most recent prior examination but slightly decreased compared to 03/03/2016. The mass again extends to the posterior aspect of the right carotid space. Subcentimeter left level II, III, and IV lymph nodes are unchanged. 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 Other major vascular structures of the neck appear patent. Partially visualized right jugular Port-A-Cath. Limited intracranial: Unremarkable. Visualized orbits: Unremarkable. Mastoids and visualized paranasal sinuses: Mild mucosal thickening inferiorly in the left greater than right maxillary sinuses, unchanged. Clear mastoid air cells. Skeleton: No suspicious lytic or blastic osseous lesions. Upper chest: Evaluated separately on concurrent chest CT. Other: None IMPRESSION: 1. Unchanged right sternocleidomastoid muscle metastasis. 2. Unchanged subcentimeter left cervical lymph nodes. 3. No evidence of new or progressive metastatic disease in the neck. Electronically Signed   By: Logan Bores M.D.   On: 10/13/2016 09:42   Ct Chest W Contrast  Result Date: 10/13/2016 CLINICAL DATA:  Nasopharyngeal cancer. EXAM: CT CHEST WITH CONTRAST TECHNIQUE: Multidetector CT imaging of the chest was performed during intravenous contrast administration. CONTRAST:  14m ISOVUE-300 IOPAMIDOL (ISOVUE-300) INJECTION 61% COMPARISON:  06/16/2016. FINDINGS: Cardiovascular: The heart size is normal. No pericardial effusion. No abdominal aortic aneurysm.  Right Port-A-Cath tip is positioned in the mid right atrium. Mediastinum/Nodes: No mediastinal lymphadenopathy. There is no hilar lymphadenopathy. The esophagus has normal imaging features. There is no axillary lymphadenopathy. Lungs/Pleura: Stable appearance of the surgical changes in the left upper lung with stable post radiation fibrosis. The nodular component measured previously at 16 x 21 mm now measures 17 x 13 mm. Pleural-parenchymal scarring in the right apex is unchanged. 3 mm right lower lobe nodule (image 77 series 6) is stable. 4 mm right lower lobe pulmonary nodule (image 85 series 6) is also unchanged. 6 mm left lower lobe nodule (image 84 series 6) is stable. No focal airspace consolidation. No pulmonary edema or pleural effusion. Upper Abdomen: Subtle 9 mm hypervascular lesion is identified in the dome of the liver (image 47 series 5) not definitely seen on prior imaging. 7 mm subcapsular hypervascular focus identified in the medial segment left liver (image 55 series 5) also not definitely seen previously. Musculoskeletal: Bone windows reveal no worrisome lytic or sclerotic osseous lesions. IMPRESSION: 1. Tiny hypervascular foci in the liver, not definitely seen on prior imaging of 06/16/2016 and 02/28/2016. Abdomen MRI without and with contrast recommended to further evaluate as metastatic disease is a concern. 2. Stable appearance of post treatment changes left upper lung and scattered tiny bilateral pulmonary nodules. Electronically Signed   By: EMisty StanleyM.D.   On: 10/13/2016 11:02    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[place] The patient has excellent response to treatment. He tolerated treatment well with no side effects CT scan of the neck and chest were reviewed with the patient which showed no evidence of active disease. I recommend we transitioned him to once a month treatment indefinitely. I plan to continue surveillance imaging study every 6 months, next due  in July 2018.   Acquired hypothyroidism He has acquired hypothyroidism from prior radiation and surgery to his neck. He is compliant taking his thyroid medicine. I reinforced the importance of  taking his thyroid replacement therapy regularly. TSH checked recently showed adequate replacement  Vertigo, intermittent He has history of intermittent vertigo. I recommend he takes Compazine as needed. He found that was very effective. He had a usually intermittently less than once a month   No orders of the defined types were placed in this encounter.  All questions were answered. The patient knows to call the clinic with any problems, questions or concerns. No barriers to learning was detected. I spent 15 minutes counseling the patient face to face. The total time spent in the appointment was 20 minutes and more than 50% was on counseling and review of test results     [name], MD 11/02/2016 10:39 AM

## 2016-11-02 NOTE — Assessment & Plan Note (Signed)
The patient has excellent response to treatment. He tolerated treatment well with no side effects CT scan of the neck and chest were reviewed with the patient which showed no evidence of active disease. I recommend we transitioned him to once a month treatment indefinitely. I plan to continue surveillance imaging study every 6 months, next due in July 2018.

## 2016-11-02 NOTE — Assessment & Plan Note (Signed)
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 He is compliant taking his thyroid medicine. I reinforced the importance of taking his thyroid replacement therapy regularly. TSH checked recently showed adequate replacement

## 2016-11-11 ENCOUNTER — Other Ambulatory Visit: Payer: Self-pay | Admitting: Hematology and Oncology

## 2016-11-30 ENCOUNTER — Ambulatory Visit: Payer: Medicaid Other

## 2016-11-30 ENCOUNTER — Encounter: Payer: Self-pay | Admitting: Hematology and Oncology

## 2016-11-30 ENCOUNTER — Other Ambulatory Visit: Payer: Medicaid Other

## 2016-11-30 ENCOUNTER — Ambulatory Visit: Payer: Medicaid Other | Admitting: Hematology and Oncology

## 2016-12-08 ENCOUNTER — Telehealth: Payer: Self-pay | Admitting: Hematology and Oncology

## 2016-12-08 NOTE — Telephone Encounter (Signed)
lvm to inform pt of 3/16 appt per LOS

## 2016-12-10 ENCOUNTER — Other Ambulatory Visit: Payer: Self-pay | Admitting: Hematology and Oncology

## 2016-12-10 ENCOUNTER — Telehealth: Payer: Self-pay

## 2016-12-10 DIAGNOSIS — C760 Malignant neoplasm of head, face and neck: Secondary | ICD-10-CM

## 2016-12-10 MED ORDER — DICLOFENAC SODIUM 50 MG PO TBEC: 50.0000 mg | DELAYED_RELEASE_TABLET | Freq: Three times a day (TID) | ORAL | 1 refills | Status: DC

## 2016-12-10 NOTE — Telephone Encounter (Signed)
I sent the prescription to pharmacy

## 2016-12-10 NOTE — Telephone Encounter (Signed)
S/w pt that the Rx was sent.

## 2016-12-10 NOTE — Telephone Encounter (Addendum)
Pt calling for pain medication refill.  He has 5 major exams coming up and does not want the sedating effect of oxycodone. He has used diclofenac 50 mg in [place] with good results at that time, and asked this RN to mention that as a possibility.

## 2016-12-11 ENCOUNTER — Telehealth: Payer: Self-pay | Admitting: *Deleted

## 2016-12-11 ENCOUNTER — Encounter: Payer: Self-pay | Admitting: Hematology and Oncology

## 2016-12-11 NOTE — Telephone Encounter (Signed)
Patient needs authorization for his voltaren. Can you do this urgently at patients request.

## 2016-12-11 NOTE — Progress Notes (Signed)
Received message from a patient call that patient requests getting PA urgently.  Called [employer](Camille) for PA for Voltaren tablet.  Approved 12/11/16-12/06/17 PA [id number] Call id [id number]  Called [facility](Stephanie) to advise. She ran it and it went through. She states they will call patient to let him know. Provided this information to [place] whom took the call.

## 2016-12-18 ENCOUNTER — Ambulatory Visit (HOSPITAL_BASED_OUTPATIENT_CLINIC_OR_DEPARTMENT_OTHER): Payer: Medicaid Other | Admitting: Hematology and Oncology

## 2016-12-18 ENCOUNTER — Ambulatory Visit (HOSPITAL_BASED_OUTPATIENT_CLINIC_OR_DEPARTMENT_OTHER): Payer: Medicaid Other

## 2016-12-18 ENCOUNTER — Other Ambulatory Visit: Payer: Self-pay | Admitting: *Deleted

## 2016-12-18 ENCOUNTER — Encounter: Payer: Self-pay | Admitting: Hematology and Oncology

## 2016-12-18 ENCOUNTER — Ambulatory Visit: Payer: Medicaid Other

## 2016-12-18 ENCOUNTER — Other Ambulatory Visit (HOSPITAL_BASED_OUTPATIENT_CLINIC_OR_DEPARTMENT_OTHER): Payer: Medicaid Other

## 2016-12-18 ENCOUNTER — Other Ambulatory Visit: Payer: Self-pay | Admitting: Hematology and Oncology

## 2016-12-18 ENCOUNTER — Telehealth: Payer: Self-pay | Admitting: Hematology and Oncology

## 2016-12-18 DIAGNOSIS — C760 Malignant neoplasm of head, face and neck: Secondary | ICD-10-CM

## 2016-12-18 DIAGNOSIS — C7989 Secondary malignant neoplasm of other specified sites: Secondary | ICD-10-CM | POA: Diagnosis not present

## 2016-12-18 DIAGNOSIS — E038 Other specified hypothyroidism: Secondary | ICD-10-CM

## 2016-12-18 DIAGNOSIS — C7800 Secondary malignant neoplasm of unspecified lung: Secondary | ICD-10-CM

## 2016-12-18 DIAGNOSIS — Z5112 Encounter for antineoplastic immunotherapy: Secondary | ICD-10-CM

## 2016-12-18 DIAGNOSIS — Z79899 Other long term (current) drug therapy: Secondary | ICD-10-CM | POA: Diagnosis not present

## 2016-12-18 DIAGNOSIS — E039 Hypothyroidism, unspecified: Secondary | ICD-10-CM

## 2016-12-18 DIAGNOSIS — I878 Other specified disorders of veins: Secondary | ICD-10-CM

## 2016-12-18 LAB — CBC WITH DIFFERENTIAL/PLATELET
BASO%: 0.4 % (ref 0.0–2.0)
BASOS ABS: 0 10*3/uL (ref 0.0–0.1)
EOS%: 3 % (ref 0.0–7.0)
Eosinophils Absolute: 0.1 10*3/uL (ref 0.0–0.5)
HEMATOCRIT: 42.2 % (ref 38.4–49.9)
HEMOGLOBIN: 14.2 g/dL (ref 13.0–17.1)
LYMPH#: 1.2 10*3/uL (ref 0.9–3.3)
LYMPH%: 26 % (ref 14.0–49.0)
MCH: 29.8 pg (ref 27.2–33.4)
MCHC: 33.6 g/dL (ref 32.0–36.0)
MCV: 88.5 fL (ref 79.3–98.0)
MONO#: 0.3 10*3/uL (ref 0.1–0.9)
MONO%: 6.4 % (ref 0.0–14.0)
NEUT#: 3 10*3/uL (ref 1.5–6.5)
NEUT%: 64.2 % (ref 39.0–75.0)
Platelets: 173 10*3/uL (ref 140–400)
RBC: 4.77 10*6/uL (ref 4.20–5.82)
RDW: 13.8 % (ref 11.0–14.6)
WBC: 4.7 10*3/uL (ref 4.0–10.3)

## 2016-12-18 LAB — COMPREHENSIVE METABOLIC PANEL
ALBUMIN: 4.1 g/dL (ref 3.5–5.0)
ALK PHOS: 51 U/L (ref 40–150)
ALT: 21 U/L (ref 0–55)
AST: 20 U/L (ref 5–34)
Anion Gap: 7 mEq/L (ref 3–11)
BUN: 14.8 mg/dL (ref 7.0–26.0)
CHLORIDE: 105 meq/L (ref 98–109)
CO2: 25 mEq/L (ref 22–29)
CREATININE: 1 mg/dL (ref 0.7–1.3)
Calcium: 9.5 mg/dL (ref 8.4–10.4)
EGFR: >90 mL/min/{1.73_m2} (ref >90)
GLUCOSE: 158 mg/dL — AB (ref 70–140)
POTASSIUM: 3.5 meq/L (ref 3.5–5.1)
Sodium: 137 mEq/L (ref 136–145)
Total Bilirubin: 0.4 mg/dL (ref 0.20–1.20)
Total Protein: 6.9 g/dL (ref 6.4–8.3)

## 2016-12-18 LAB — TSH: TSH: 5.021 m[IU]/L — AB (ref 0.320–4.118)

## 2016-12-18 MED ORDER — SODIUM CHLORIDE 0.9 % IJ SOLN
10.0000 mL | INTRAMUSCULAR | Status: DC | PRN
  Administered 2016-12-18: 10 mL
  Filled 2016-12-18: qty 10

## 2016-12-18 MED ORDER — SODIUM CHLORIDE 0.9 % IJ SOLN
10.0000 mL | INTRAMUSCULAR | Status: AC | PRN
  Administered 2016-12-18: 10 mL
  Filled 2016-12-18: qty 10

## 2016-12-18 MED ORDER — LEVOTHYROXINE SODIUM 100 MCG PO TABS: 100.0000 ug | ORAL_TABLET | Freq: Every day | ORAL | 0 refills | Status: DC

## 2016-12-18 MED ORDER — SODIUM CHLORIDE 0.9 % IV SOLN
Freq: Once | INTRAVENOUS | Status: AC
  Administered 2016-12-18: 13:00:00 via INTRAVENOUS

## 2016-12-18 MED ORDER — NIVOLUMAB CHEMO INJECTION 100 MG/10ML
240.0000 mg | Freq: Once | INTRAVENOUS | Status: AC
  Administered 2016-12-18: 240 mg via INTRAVENOUS
  Filled 2016-12-18: qty 24

## 2016-12-18 MED ORDER — ANTICOAGULANT SODIUM CITRATE 4% (200MG/5ML) IV SOLN
5.0000 mL | Status: DC | PRN
  Administered 2016-12-18: 5 mL via INTRAVENOUS
  Filled 2016-12-18: qty 5

## 2016-12-18 NOTE — Progress Notes (Signed)
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 Pan-endoscopy elsewhere showed cancer from nasopharyngeal space.      01/04/2007 - 02/20/2007 Chemotherapy    He received 2 cycles of cisplatin and 5FU followed by concurrent chemo with weekly cisplatin and radiation. He only received 2 doses of chemo due to severe mucositis, nausea and weight loss.      04/05/2007 - 08/04/2007 Chemotherapy    He received 4 more courses of cisplatin with 5FU and had complete response      07/05/2009 Procedure    Fine-needle aspirate of the right level II lymph nodes come from recurrent metastatic disease. Repeat endoscopy and CT scan show no evidence of disease elsewhere.      07/08/2009 - 12/02/2009 Chemotherapy    He was given 6 cycles of carboplatin, 5-FU and docetaxel      12/03/2009 Surgery    He has surgery to the residual lymph node on the right neck which showed no evidence of disease.      02/22/2012 Imaging    Repeat imaging study showed large recurrent mass. He was referred  elsewhere for further treatment.      05/03/2012 Surgery    He underwent left upper lobectomy.      04/29/2013 Imaging    PEt scan showed lesion on right level II B and lower lung was abnormal      06/03/2013 - 02/02/2014 Chemotherapy    He had 6 cycles of chemotherapy when he was found to have recurrence of cancer and had received oxaliplatin and capecitabine      06/07/2014 Imaging    PET CT scan showed persistent disease in the right neck lymph nodes and left lung      06/29/2014 Procedure    Accession: [id number] repeat LUL biopsy confirmed metastatic cancer      07/18/2014 - 07/31/2014 Radiation Therapy    He received palliative radiation therapy to the lungs      10/10/2014 Imaging    CT scan of the chest, abdomen and pelvis show regression in the size of the lung nodule in the left upper lobe and stable pulmonary nodules      01/24/2015 Imaging    CT scan showed stable disease in neck and lung      06/19/2015 Imaging    CT scan of the neck and the chest show possible mild progression of the nodule in the right side of the neck.      06/25/2015 Imaging    PET scan confirmed disease recurrence in the neck  07/07/2015 Imaging    He had MRI neck [facility]      09/03/2015 -  Chemotherapy    He received palliative chemo with Nivolumab      10/29/2015 Imaging    PET CT showed positive response to Rx      02/28/2016 Imaging    Ct abdomen showed abnormal thinkening in his stomach      03/03/2016 Imaging    CT: Right sternocleidomastoid muscle metastasis appears less distinct but otherwise not significantly changed in size or configuration since 06/19/2015.2. Left level 3 lymph node which was hypermetabolic by PET-CT in January 2017 appears slightly smaller      04/01/2016 Imaging    CT cervical spine showed no acute fracture or traumatic malalignment in the cervical spine      04/22/2016 Procedure    Port-a-cath placed.      06/16/2016 Imaging    Ct neck showed  right sternocleidomastoid muscle metastasis is further decreased in conspicuity since May, and has mildly decreased in size since September 2016. Continued stability of sub-centimeter left cervical lymph nodes. No new or progressive metastatic disease in the neck.      06/16/2016 Imaging    CT chest showed stable masslike radiation fibrosis in the left upper lobe. Stable subcentimeter pulmonary nodules in the bilateral lower lobes. No new or progressive metastatic disease in the chest. Nonobstructing left renal stone.      10/13/2016 Imaging    Ct neck showed unchanged right sternocleidomastoid muscle metastasis. Unchanged subcentimeter left cervical lymph nodes. No evidence of new or progressive metastatic disease in the neck.      10/13/2016 Imaging    CT chest showed tiny hypervascular foci in the liver, not definitely seen on prior imaging of 06/16/2016 and 02/28/2016. Abdomen MRI without and with contrast recommended to further evaluate as metastatic disease is a concern. 2. Stable appearance of post treatment changes left upper lung and scattered tiny bilateral pulmonary nodules.       INTERVAL HISTORY: Please see below for problem oriented charting. He returns for further follow-up. He complained of insomnia recently.  He has completed all his major exams. His neck pain is getting better as the weather warms up. Denies cough, chest pain or shortness of breath  REVIEW OF SYSTEMS:   Constitutional: Denies fevers, chills or abnormal weight loss Eyes: Denies blurriness of vision Ears, nose, mouth, throat, and face: Denies mucositis or sore throat Respiratory: Denies cough, dyspnea or wheezes Cardiovascular: Denies palpitation, chest discomfort or lower extremity swelling Gastrointestinal:  Denies nausea, heartburn or change in bowel habits Skin: Denies abnormal skin rashes Lymphatics: Denies new lymphadenopathy or easy bruising Neurological:Denies numbness, tingling or new  weaknesses Behavioral/Psych: Mood is stable, no new changes  All other systems were reviewed with the patient and are negative.  I have reviewed the past medical history, past surgical history, social history and family history with the patient and they are unchanged from previous note.  ALLERGIES:  is allergic to heparin.  MEDICATIONS:  Current Outpatient Prescriptions  Medication Sig Dispense Refill  . diclofenac (VOLTAREN) 50 MG EC tablet Take 1 tablet (50 mg total) by mouth 3 (three) times daily. 60 tablet 1  . levothyroxine (SYNTHROID, LEVOTHROID) 75 MCG tablet TAKE 1 TABLET BY MOUTH EVERY DAY BEFORE BREAKFAST 90 tablet 0  . ibuprofen (ADVIL,MOTRIN) 800 MG tablet TK 1 T PO Q 8 H WF OR MILK  0  . lidocaine-prilocaine (EMLA) cream Apply 1 application topically as needed. (Patient not  taking: Reported on 12/18/2016) 30 g 3  . ondansetron (ZOFRAN) 8 MG tablet Take 1 tablet (8 mg total) by mouth every 8 (eight) hours as needed for nausea or vomiting. (Patient not taking: Reported on 11/02/2016) 90 tablet 1  . oxyCODONE (ROXICODONE) 15 MG immediate release tablet Take 1 tablet (15 mg total) by mouth every 4 (four) hours as needed for pain. (Patient not taking: Reported on 12/18/2016) 60 tablet 0   No current facility-administered medications for this visit.    Facility-Administered Medications Ordered in Other Visits  Medication Dose Route Frequency Provider Last Rate Last Dose  . anticoagulant sodium citrate solution 5 mL  5 mL Intravenous PRN [name], MD      . sodium chloride 0.9 % injection 10 mL  10 mL Intracatheter PRN [name], MD      . sodium chloride 0.9 % injection 10 mL  10 mL Intracatheter PRN [name], MD        PHYSICAL EXAMINATION: ECOG PERFORMANCE STATUS: 1 - Symptomatic but completely ambulatory  Vitals:   12/18/16 1115  BP: (!) 109/58  Pulse: 76  Resp: 18  Temp: 98.2 F (36.8 C)   Filed Weights   12/18/16 1115  Weight: 169 lb 4.8 oz (76.8 kg)     GENERAL:alert, no distress and comfortable SKIN: skin color, texture, turgor are normal, no rashes or significant lesions EYES: normal, Conjunctiva are pink and non-injected, sclera clear OROPHARYNX:no exudate, no erythema and lips, buccal mucosa, and tongue normal  NECK: Well-healed surgical scar on his neck.  No other abnormalities noted LYMPH:  no palpable lymphadenopathy in the cervical, axillary or inguinal LUNGS: clear to auscultation and percussion with normal breathing effort HEART: regular rate & rhythm and no murmurs and no lower extremity edema ABDOMEN:abdomen soft, non-tender and normal bowel sounds Musculoskeletal:no cyanosis of digits and no clubbing  NEURO: alert & oriented x 3 with fluent speech, no focal motor/sensory deficits  LABORATORY DATA:  I have reviewed the data as listed    Component Value Date/Time   NA 137 12/18/2016 1052   K 3.5 12/18/2016 1052   CL 104 06/29/2014 0655   CO2 25 12/18/2016 1052   GLUCOSE 158 (H) 12/18/2016 1052   BUN 14.8 12/18/2016 1052   CREATININE 1.0 12/18/2016 1052   CALCIUM 9.5 12/18/2016 1052   PROT 6.9 12/18/2016 1052   ALBUMIN 4.1 12/18/2016 1052   AST 20 12/18/2016 1052   ALT 21 12/18/2016 1052   ALKPHOS 51 12/18/2016 1052   BILITOT 0.40 12/18/2016 1052   GFRNONAA >90 06/29/2014 0655   GFRAA >90 06/29/2014 0655    No results found for: SPEP, UPEP  Lab Results  Component Value Date   WBC 4.7 12/18/2016   NEUTROABS 3.0 12/18/2016   HGB 14.2 12/18/2016   HCT 42.2 12/18/2016   MCV 88.5 12/18/2016   PLT 173 12/18/2016      Chemistry      Component Value Date/Time   NA 137 12/18/2016 1052   K 3.5 12/18/2016 1052   CL 104 06/29/2014 0655   CO2 25 12/18/2016 1052   BUN 14.8 12/18/2016 1052   CREATININE 1.0 12/18/2016 1052      Component Value Date/Time   CALCIUM 9.5 12/18/2016 1052   ALKPHOS 51 12/18/2016 1052   AST 20 12/18/2016 1052   ALT 21 12/18/2016 1052   BILITOT 0.40 12/18/2016 1052       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[place]) The patient  has excellent response to treatment. He tolerated treatment well with no side effects CT scan of the neck and chest were reviewed with the patient which showed no evidence of active disease. I recommend we transitioned him to once a month treatment indefinitely. I plan to continue surveillance imaging study every 6 months, next due in July 2018.   Acquired hypothyroidism His recent TSH is a little high. I question his compliance. We will continue to monitor TSH carefully while he is on treatment   No orders of the defined types were placed in this encounter.  All questions were answered. The patient knows to call the clinic with any problems, questions or concerns. No barriers to learning was detected. I spent 15 minutes counseling the patient face to face. The total time spent in the appointment was 20 minutes and more than 50% was on counseling and review of test results     [name], MD 12/18/2016 1:15 PM

## 2016-12-18 NOTE — Assessment & Plan Note (Signed)
The patient has excellent response to treatment. He tolerated treatment well with no side effects CT scan of the neck and chest were reviewed with the patient which showed no evidence of active disease. I recommend we transitioned him to once a month treatment indefinitely. I plan to continue surveillance imaging study every 6 months, next due in July 2018.

## 2016-12-18 NOTE — Assessment & Plan Note (Signed)
His recent TSH is a little high. I question his compliance. We will continue to monitor TSH carefully while he is on treatment

## 2016-12-18 NOTE — Patient Instructions (Signed)
[facility] Discharge Instructions for Patients Receiving Chemotherapy  Today you received the following chemotherapy agents:  Opdivo (nivolumab)  To help prevent nausea and vomiting after your treatment, we encourage you to take your nausea medication as prescribed.   If you develop nausea and vomiting that is not controlled by your nausea medication, call the clinic.   BELOW ARE SYMPTOMS THAT SHOULD BE REPORTED IMMEDIATELY:  *FEVER GREATER THAN 100.5 F  *CHILLS WITH OR WITHOUT FEVER  NAUSEA AND VOMITING THAT IS NOT CONTROLLED WITH YOUR NAUSEA MEDICATION  *UNUSUAL SHORTNESS OF BREATH  *UNUSUAL BRUISING OR BLEEDING  TENDERNESS IN MOUTH AND THROAT WITH OR WITHOUT PRESENCE OF ULCERS  *URINARY PROBLEMS  *BOWEL PROBLEMS  UNUSUAL RASH Items with * indicate a potential emergency and should be followed up as soon as possible.  Feel free to call the clinic you have any questions or concerns. The clinic phone number is [phone].  Please show the [place] [facility] at check-in to the Emergency Department and triage nurse.

## 2016-12-18 NOTE — Telephone Encounter (Signed)
Appointments scheduled per 3.16.18 LOS. Patient will pick up report in infusion area.

## 2017-01-18 ENCOUNTER — Ambulatory Visit (HOSPITAL_BASED_OUTPATIENT_CLINIC_OR_DEPARTMENT_OTHER): Payer: Medicaid Other

## 2017-01-18 ENCOUNTER — Encounter: Payer: Self-pay | Admitting: Hematology and Oncology

## 2017-01-18 ENCOUNTER — Ambulatory Visit: Payer: Medicaid Other

## 2017-01-18 ENCOUNTER — Other Ambulatory Visit (HOSPITAL_BASED_OUTPATIENT_CLINIC_OR_DEPARTMENT_OTHER): Payer: Medicaid Other

## 2017-01-18 ENCOUNTER — Other Ambulatory Visit: Payer: Self-pay | Admitting: Hematology and Oncology

## 2017-01-18 ENCOUNTER — Ambulatory Visit (HOSPITAL_BASED_OUTPATIENT_CLINIC_OR_DEPARTMENT_OTHER): Payer: Medicaid Other | Admitting: Hematology and Oncology

## 2017-01-18 ENCOUNTER — Telehealth: Payer: Self-pay | Admitting: Hematology and Oncology

## 2017-01-18 DIAGNOSIS — C760 Malignant neoplasm of head, face and neck: Secondary | ICD-10-CM

## 2017-01-18 DIAGNOSIS — Z5112 Encounter for antineoplastic immunotherapy: Secondary | ICD-10-CM

## 2017-01-18 DIAGNOSIS — Z720 Tobacco use: Secondary | ICD-10-CM

## 2017-01-18 DIAGNOSIS — C7989 Secondary malignant neoplasm of other specified sites: Secondary | ICD-10-CM

## 2017-01-18 DIAGNOSIS — C7800 Secondary malignant neoplasm of unspecified lung: Secondary | ICD-10-CM

## 2017-01-18 DIAGNOSIS — I878 Other specified disorders of veins: Secondary | ICD-10-CM

## 2017-01-18 DIAGNOSIS — E039 Hypothyroidism, unspecified: Secondary | ICD-10-CM

## 2017-01-18 LAB — COMPREHENSIVE METABOLIC PANEL
ALK PHOS: 53 U/L (ref 40–150)
ALT: 24 U/L (ref 0–55)
AST: 23 U/L (ref 5–34)
Albumin: 4.3 g/dL (ref 3.5–5.0)
Anion Gap: 10 mEq/L (ref 3–11)
BUN: 14.8 mg/dL (ref 7.0–26.0)
CO2: 25 meq/L (ref 22–29)
Calcium: 9.2 mg/dL (ref 8.4–10.4)
Chloride: 107 mEq/L (ref 98–109)
Creatinine: 0.9 mg/dL (ref 0.7–1.3)
EGFR: >90 mL/min/{1.73_m2} (ref >90)
GLUCOSE: 103 mg/dL (ref 70–140)
Potassium: 3.9 mEq/L (ref 3.5–5.1)
SODIUM: 142 meq/L (ref 136–145)
TOTAL PROTEIN: 7.2 g/dL (ref 6.4–8.3)
Total Bilirubin: 0.37 mg/dL (ref 0.20–1.20)

## 2017-01-18 LAB — CBC WITH DIFFERENTIAL/PLATELET
BASO%: 1 % (ref 0.0–2.0)
Basophils Absolute: 0 10*3/uL (ref 0.0–0.1)
EOS ABS: 0.1 10*3/uL (ref 0.0–0.5)
EOS%: 2.4 % (ref 0.0–7.0)
HCT: 43.8 % (ref 38.4–49.9)
HEMOGLOBIN: 15 g/dL (ref 13.0–17.1)
LYMPH%: 26.6 % (ref 14.0–49.0)
MCH: 30.4 pg (ref 27.2–33.4)
MCHC: 34.2 g/dL (ref 32.0–36.0)
MCV: 89.1 fL (ref 79.3–98.0)
MONO#: 0.4 10*3/uL (ref 0.1–0.9)
MONO%: 9.8 % (ref 0.0–14.0)
NEUT#: 2.7 10*3/uL (ref 1.5–6.5)
NEUT%: 60.2 % (ref 39.0–75.0)
PLATELETS: 213 10*3/uL (ref 140–400)
RBC: 4.92 10*6/uL (ref 4.20–5.82)
RDW: 14.5 % (ref 11.0–14.6)
WBC: 4.5 10*3/uL (ref 4.0–10.3)
lymph#: 1.2 10*3/uL (ref 0.9–3.3)

## 2017-01-18 LAB — TSH: TSH: 4.974 m[IU]/L — AB (ref 0.320–4.118)

## 2017-01-18 MED ORDER — SODIUM CHLORIDE 0.9 % IJ SOLN
10.0000 mL | INTRAMUSCULAR | Status: DC | PRN
  Administered 2017-01-18: 10 mL
  Filled 2017-01-18: qty 10

## 2017-01-18 MED ORDER — SODIUM CHLORIDE 0.9 % IV SOLN
Freq: Once | INTRAVENOUS | Status: AC
  Administered 2017-01-18: 12:00:00 via INTRAVENOUS

## 2017-01-18 MED ORDER — SODIUM CHLORIDE 0.9 % IV SOLN
240.0000 mg | Freq: Once | INTRAVENOUS | Status: AC
  Administered 2017-01-18: 240 mg via INTRAVENOUS
  Filled 2017-01-18: qty 24

## 2017-01-18 MED ORDER — SODIUM CHLORIDE 0.9 % IJ SOLN
10.0000 mL | INTRAMUSCULAR | Status: AC | PRN
  Administered 2017-01-18: 10 mL
  Filled 2017-01-18: qty 10

## 2017-01-18 MED ORDER — MECLIZINE HCL 25 MG PO TABS: 25.0000 mg | ORAL_TABLET | Freq: Three times a day (TID) | ORAL | 0 refills | Status: DC | PRN

## 2017-01-18 MED ORDER — ANTICOAGULANT SODIUM CITRATE 4% (200MG/5ML) IV SOLN
5.0000 mL | Status: DC | PRN
  Administered 2017-01-18: 5 mL via INTRAVENOUS
  Filled 2017-01-18: qty 5

## 2017-01-18 MED ORDER — OXYCODONE HCL 15 MG PO TABS: 15.0000 mg | ORAL_TABLET | ORAL | 0 refills | Status: DC | PRN

## 2017-01-18 NOTE — Assessment & Plan Note (Signed)
The patient has excellent response to treatment. He tolerated treatment well with no side effects CT scan of the neck and chest were reviewed with the patient which showed no evidence of active disease. I recommend we transitioned him to once a month treatment indefinitely. I plan to continue surveillance imaging study every 6 months, next due in July 2018.

## 2017-01-18 NOTE — Telephone Encounter (Signed)
Gave patient avs report and appointments for May  °

## 2017-01-18 NOTE — Patient Instructions (Signed)

## 2017-01-18 NOTE — Progress Notes (Signed)
Upon arrival for lab draw from port, pt stated that he did not feel well enough for chemotherapy today. Requested to be seen by Dr. Alvy Bimler before lab draw. Dr. Alvy Bimler  Stated that this was fine. Tubes sent with patient to MD visit with Dr. Alvy Bimler

## 2017-01-18 NOTE — Progress Notes (Signed)
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 Pan-endoscopy elsewhere showed cancer from nasopharyngeal space.      01/04/2007 - 02/20/2007 Chemotherapy    He received 2 cycles of cisplatin and 5FU followed by concurrent chemo with weekly cisplatin and radiation. He only received 2 doses of chemo due to severe mucositis, nausea and weight loss.      04/05/2007 - 08/04/2007 Chemotherapy    He received 4 more courses of cisplatin with 5FU and had complete response      07/05/2009 Procedure    Fine-needle aspirate of the right level II lymph nodes come from recurrent metastatic disease. Repeat endoscopy and CT scan show no evidence of disease elsewhere.      07/08/2009 - 12/02/2009 Chemotherapy    He was given 6 cycles of carboplatin, 5-FU and docetaxel      12/03/2009 Surgery    He has surgery to the residual lymph node on the right neck which showed no evidence of disease.      02/22/2012 Imaging    Repeat imaging study showed large recurrent mass. He was referred  elsewhere for further treatment.      05/03/2012 Surgery    He underwent left upper lobectomy.      04/29/2013 Imaging    PEt scan showed lesion on right level II B and lower lung was abnormal      06/03/2013 - 02/02/2014 Chemotherapy    He had 6 cycles of chemotherapy when he was found to have recurrence of cancer and had received oxaliplatin and capecitabine      06/07/2014 Imaging    PET CT scan showed persistent disease in the right neck lymph nodes and left lung      06/29/2014 Procedure    Accession: [id number] repeat LUL biopsy confirmed metastatic cancer      07/18/2014 - 07/31/2014 Radiation Therapy    He received palliative radiation therapy to the lungs      10/10/2014 Imaging    CT scan of the chest, abdomen and pelvis show regression in the size of the lung nodule in the left upper lobe and stable pulmonary nodules      01/24/2015 Imaging    CT scan showed stable disease in neck and lung      06/19/2015 Imaging    CT scan of the neck and the chest show possible mild progression of the nodule in the right side of the neck.      06/25/2015 Imaging    PET scan confirmed disease recurrence in the neck  07/07/2015 Imaging    He had MRI neck at [facility]      09/03/2015 -  Chemotherapy    He received palliative chemo with Nivolumab      10/29/2015 Imaging    PET CT showed positive response to Rx      02/28/2016 Imaging    Ct abdomen showed abnormal thinkening in his stomach      03/03/2016 Imaging    CT: Right sternocleidomastoid muscle metastasis appears less distinct but otherwise not significantly changed in size or configuration since 06/19/2015.2. Left level 3 lymph node which was hypermetabolic by PET-CT in January 2017 appears slightly smaller      04/01/2016 Imaging    CT cervical spine showed no acute fracture or traumatic malalignment in the cervical spine      04/22/2016 Procedure    Port-a-cath placed.      06/16/2016 Imaging    Ct neck showed  right sternocleidomastoid muscle metastasis is further decreased in conspicuity since May, and has mildly decreased in size since September 2016. Continued stability of sub-centimeter left cervical lymph nodes. No new or progressive metastatic disease in the neck.      06/16/2016 Imaging    CT chest showed stable masslike radiation fibrosis in the left upper lobe. Stable subcentimeter pulmonary nodules in the bilateral lower lobes. No new or progressive metastatic disease in the chest. Nonobstructing left renal stone.      10/13/2016 Imaging    Ct neck showed unchanged right sternocleidomastoid muscle metastasis. Unchanged subcentimeter left cervical lymph nodes. No evidence of new or progressive metastatic disease in the neck.      10/13/2016 Imaging    CT chest showed tiny hypervascular foci in the liver, not definitely seen on prior imaging of 06/16/2016 and 02/28/2016. Abdomen MRI without and with contrast recommended to further evaluate as metastatic disease is a concern. 2. Stable appearance of post treatment changes left upper lung and scattered tiny bilateral pulmonary nodules.       INTERVAL HISTORY: Please see below for problem oriented charting. He returns for further follow-up. He does not feel well. He is worried. He is awaiting for results from his college application. He does not sleep well. He recently started smoking, but is hoping to quit soon He denies chest pain or shortness of breath.  He complained of fatigue  REVIEW OF SYSTEMS:   Constitutional: Denies fevers, chills or abnormal weight loss Eyes: Denies blurriness of vision Ears, nose, mouth, throat, and face: Denies mucositis or sore throat Respiratory: Denies cough, dyspnea or wheezes Cardiovascular: Denies palpitation, chest discomfort or lower extremity swelling Gastrointestinal:  Denies nausea, heartburn or change in bowel habits Skin: Denies abnormal skin rashes Lymphatics: Denies new lymphadenopathy or  easy bruising Neurological:Denies numbness, tingling or new weaknesses Behavioral/Psych: Mood is stable, no new changes  All other systems were reviewed with the patient and are negative.  I have reviewed the past medical history, past surgical history, social history and family history with the patient and they are unchanged from previous note.  ALLERGIES:  is allergic to heparin.  MEDICATIONS:  Current Outpatient Prescriptions  Medication Sig Dispense Refill  . diclofenac (VOLTAREN) 50 MG EC tablet Take 1 tablet (50 mg total) by mouth 3 (three) times daily. 60 tablet 1  . levothyroxine (SYNTHROID, LEVOTHROID) 100 MCG tablet TAKE 1 TABLET BY MOUTH DAILY BEFORE BREAKFAST. ALTERNATE WITH 75MCG 90 tablet 9  . levothyroxine (SYNTHROID, LEVOTHROID) 75 MCG tablet TAKE 1 TABLET BY MOUTH EVERY DAY BEFORE BREAKFAST  90 tablet 0  . lidocaine-prilocaine (EMLA) cream Apply 1 application topically as needed. 30 g 3  . oxyCODONE (ROXICODONE) 15 MG immediate release tablet Take 1 tablet (15 mg total) by mouth every 4 (four) hours as needed for pain. 60 tablet 0  . ibuprofen (ADVIL,MOTRIN) 800 MG tablet TK 1 T PO Q 8 H WF OR MILK  0  . meclizine (ANTIVERT) 25 MG tablet Take 1 tablet (25 mg total) by mouth 3 (three) times daily as needed for dizziness. 30 tablet 0  . ondansetron (ZOFRAN) 8 MG tablet Take 1 tablet (8 mg total) by mouth every 8 (eight) hours as needed for nausea or vomiting. (Patient not taking: Reported on 11/02/2016) 90 tablet 1   No current facility-administered medications for this visit.    Facility-Administered Medications Ordered in Other Visits  Medication Dose Route Frequency Provider Last Rate Last Dose  . 0.9 %  sodium chloride infusion   Intravenous Once [name], MD      . anticoagulant sodium citrate solution 5 mL  5 mL Intravenous PRN [name], MD      . nivolumab (OPDIVO) 240 mg in sodium chloride 0.9 % 100 mL chemo infusion  240 mg Intravenous Once [name], MD 248  mL/hr at 01/18/17 1257 240 mg at 01/18/17 1257  . sodium chloride 0.9 % injection 10 mL  10 mL Intracatheter PRN [name], MD      . sodium chloride 0.9 % injection 10 mL  10 mL Intracatheter PRN [name], MD        PHYSICAL EXAMINATION: ECOG PERFORMANCE STATUS: 1 - Symptomatic but completely ambulatory  Vitals:   01/18/17 1045  BP: 101/65  Pulse: 78  Resp: 18  Temp: 97.9 F (36.6 C)   Filed Weights   01/18/17 1045  Weight: 172 lb 6.4 oz (78.2 kg)    GENERAL:alert, no distress and comfortable SKIN: skin color, texture, turgor are normal, no rashes or significant lesions EYES: normal, Conjunctiva are pink and non-injected, sclera clear OROPHARYNX:no exudate, no erythema and lips, buccal mucosa, and tongue normal  NECK: Noted well-healed surgical scar.  No other abnormalities LYMPH:  no palpable lymphadenopathy in the cervical, axillary or inguinal LUNGS: clear to auscultation and percussion with normal breathing effort HEART: regular rate & rhythm and no murmurs and no lower extremity edema ABDOMEN:abdomen soft, non-tender and normal bowel sounds Musculoskeletal:no cyanosis of digits and no clubbing  NEURO: alert & oriented x 3 with fluent speech, no focal motor/sensory deficits  LABORATORY DATA:  I have reviewed the data as listed    Component Value Date/Time   NA 142 01/18/2017 1031   K 3.9 01/18/2017 1031   CL 104 06/29/2014 0655   CO2 25 01/18/2017 1031   GLUCOSE 103 01/18/2017 1031   BUN 14.8 01/18/2017 1031   CREATININE 0.9 01/18/2017 1031   CALCIUM 9.2 01/18/2017 1031   PROT 7.2 01/18/2017 1031   ALBUMIN 4.3 01/18/2017 1031   AST 23 01/18/2017 1031   ALT 24 01/18/2017 1031   ALKPHOS 53 01/18/2017 1031   BILITOT 0.37 01/18/2017 1031   GFRNONAA >90 06/29/2014 0655   GFRAA >90 06/29/2014 0655    No results found for: SPEP, UPEP  Lab Results  Component Value Date   WBC 4.5 01/18/2017   NEUTROABS 2.7 01/18/2017   HGB 15.0 01/18/2017   HCT 43.8  01/18/2017   MCV 89.1 01/18/2017   PLT 213 01/18/2017      Chemistry  Component Value Date/Time   NA 142 01/18/2017 1031   K 3.9 01/18/2017 1031   CL 104 06/29/2014 0655   CO2 25 01/18/2017 1031   BUN 14.8 01/18/2017 1031   CREATININE 0.9 01/18/2017 1031      Component Value Date/Time   CALCIUM 9.2 01/18/2017 1031   ALKPHOS 53 01/18/2017 1031   AST 23 01/18/2017 1031   ALT 24 01/18/2017 1031   BILITOT 0.37 01/18/2017 1031       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 He tolerated treatment well with no side effects CT scan of the neck and chest were reviewed with the patient which showed no evidence of active disease. I recommend we transitioned him to once a month treatment indefinitely. I plan to continue surveillance imaging study every 6 months, next due in July 2018.   Metastasis to lung He is not symptomatic. Observe only CT chest in January 2018 showed no evidence of disease progression I plan to repeat CT chest again in 6 months, next due July 2018  Acquired hypothyroidism He complained of recent fatigue. Repeat TSH showed improvement since recent dose adjustment He will continue 100 MCG of levothyroxine for now  Tobacco abuse I spent some time counseling the patient the importance of tobacco cessation. We discussed common strategies including nicotine patches, Tobacco Quit-line, and other nicotine replacement products to assist in hiseffort to quit  he appears motivated to quit.    No orders of the defined types were placed in this encounter.  All questions were answered. The patient knows to call the clinic with any problems, questions or concerns. No barriers to learning was detected. I spent 15 minutes counseling the patient face to face. The total time spent in the appointment was 20 minutes and more than 50% was on counseling and review of test results     [name],  MD 01/18/2017 1:12 PM

## 2017-01-18 NOTE — Patient Instructions (Signed)
[facility] Discharge Instructions for Patients Receiving Chemotherapy  Today you received the following chemotherapy agents:  Nivolumab.  To help prevent nausea and vomiting after your treatment, we encourage you to take your nausea medication as directed.   If you develop nausea and vomiting that is not controlled by your nausea medication, call the clinic.   BELOW ARE SYMPTOMS THAT SHOULD BE REPORTED IMMEDIATELY:  *FEVER GREATER THAN 100.5 F  *CHILLS WITH OR WITHOUT FEVER  NAUSEA AND VOMITING THAT IS NOT CONTROLLED WITH YOUR NAUSEA MEDICATION  *UNUSUAL SHORTNESS OF BREATH  *UNUSUAL BRUISING OR BLEEDING  TENDERNESS IN MOUTH AND THROAT WITH OR WITHOUT PRESENCE OF ULCERS  *URINARY PROBLEMS  *BOWEL PROBLEMS  UNUSUAL RASH Items with * indicate a potential emergency and should be followed up as soon as possible.  Feel free to call the clinic you have any questions or concerns. The clinic phone number is [phone].  Please show the CHEMO ALERT CARD at check-in to the Emergency Department and triage nurse.   

## 2017-01-18 NOTE — Assessment & Plan Note (Signed)
I spent some time counseling the patient the importance of tobacco cessation. We discussed common strategies including nicotine patches, Tobacco Quit-line, and other nicotine replacement products to assist in hiseffort to quit  he appears motivated to quit.  

## 2017-01-18 NOTE — Assessment & Plan Note (Addendum)
He is not symptomatic. Observe only CT chest in January 2018 showed no evidence of disease progression I plan to repeat CT chest again in 6 months, next due July 2018

## 2017-01-18 NOTE — Assessment & Plan Note (Signed)
He complained of recent fatigue. Repeat TSH showed improvement since recent dose adjustment He will continue 100 MCG of levothyroxine for now

## 2017-02-01 ENCOUNTER — Other Ambulatory Visit: Payer: Self-pay | Admitting: Hematology and Oncology

## 2017-02-01 ENCOUNTER — Telehealth: Payer: Self-pay

## 2017-02-01 DIAGNOSIS — C760 Malignant neoplasm of head, face and neck: Secondary | ICD-10-CM

## 2017-02-01 DIAGNOSIS — C7800 Secondary malignant neoplasm of unspecified lung: Secondary | ICD-10-CM

## 2017-02-01 NOTE — Telephone Encounter (Signed)
s/w pt per Dr Alvy Bimler message. Sent inbasket to [facility] for [employer]

## 2017-02-01 NOTE — Telephone Encounter (Signed)
1) He needs to take his pain medications 2) It would be helpful to get repeat CT: I have placed order for Ct chest and neck. Please contact precert to get authorization ASAP so that it could be done before I see him on 5/14 3) I have no opening until then. Also need CT to be done first so keep appt as is for now

## 2017-02-01 NOTE — Telephone Encounter (Signed)
Pt has pain in shoulder muscle on right side where he had the surgery. He states it is not getting better, nor worse. He is not exercising it, he is not stretching. It hurts too much to stretch. It is feeling different than before and he feels he needs to be seen sooner than 5/14. He is not using the oxycodone for it. He mentioned he wants to keep the 5/14 appt for treatment.   He needs some documents for school. Explained to bring the documents to [facility] and she will get them to Dr Calton Dach RN.

## 2017-02-04 ENCOUNTER — Ambulatory Visit
Admission: RE | Admit: 2017-02-04 | Discharge: 2017-02-04 | Disposition: A | Discharge status: Home / Self Care | Payer: Medicaid Other | Source: Ambulatory Visit | Attending: Internal Medicine | Admitting: Internal Medicine

## 2017-02-04 ENCOUNTER — Other Ambulatory Visit: Payer: Self-pay | Admitting: Internal Medicine

## 2017-02-04 DIAGNOSIS — Z09 Encounter for follow-up examination after completed treatment for conditions other than malignant neoplasm: Secondary | ICD-10-CM

## 2017-02-04 NOTE — Telephone Encounter (Signed)
Got PA message from [facility] for CT chest and neck. Contacted central scheduling for them to call pt to schedule CT.

## 2017-02-11 ENCOUNTER — Ambulatory Visit (HOSPITAL_COMMUNITY)
Admission: RE | Admit: 2017-02-11 | Discharge: 2017-02-11 | Disposition: A | Discharge status: Home / Self Care | Payer: Medicaid Other | Source: Ambulatory Visit | Attending: Hematology and Oncology | Admitting: Hematology and Oncology

## 2017-02-11 DIAGNOSIS — N200 Calculus of kidney: Secondary | ICD-10-CM | POA: Insufficient documentation

## 2017-02-11 DIAGNOSIS — C7800 Secondary malignant neoplasm of unspecified lung: Secondary | ICD-10-CM | POA: Insufficient documentation

## 2017-02-11 DIAGNOSIS — C760 Malignant neoplasm of head, face and neck: Secondary | ICD-10-CM | POA: Insufficient documentation

## 2017-02-11 MED ORDER — IOPAMIDOL (ISOVUE-300) INJECTION 61%
INTRAVENOUS | Status: AC
  Administered 2017-02-11: 75 mL
  Filled 2017-02-11: qty 75

## 2017-02-15 ENCOUNTER — Ambulatory Visit (HOSPITAL_BASED_OUTPATIENT_CLINIC_OR_DEPARTMENT_OTHER): Payer: Medicaid Other | Admitting: Hematology and Oncology

## 2017-02-15 ENCOUNTER — Ambulatory Visit: Payer: Medicaid Other

## 2017-02-15 ENCOUNTER — Other Ambulatory Visit (HOSPITAL_BASED_OUTPATIENT_CLINIC_OR_DEPARTMENT_OTHER): Payer: Medicaid Other

## 2017-02-15 ENCOUNTER — Encounter: Payer: Self-pay | Admitting: Hematology and Oncology

## 2017-02-15 ENCOUNTER — Telehealth: Payer: Self-pay | Admitting: Hematology and Oncology

## 2017-02-15 ENCOUNTER — Ambulatory Visit (HOSPITAL_BASED_OUTPATIENT_CLINIC_OR_DEPARTMENT_OTHER): Payer: Medicaid Other

## 2017-02-15 DIAGNOSIS — C7989 Secondary malignant neoplasm of other specified sites: Secondary | ICD-10-CM

## 2017-02-15 DIAGNOSIS — C760 Malignant neoplasm of head, face and neck: Secondary | ICD-10-CM

## 2017-02-15 DIAGNOSIS — C7800 Secondary malignant neoplasm of unspecified lung: Secondary | ICD-10-CM

## 2017-02-15 DIAGNOSIS — Z5112 Encounter for antineoplastic immunotherapy: Secondary | ICD-10-CM | POA: Diagnosis not present

## 2017-02-15 DIAGNOSIS — I878 Other specified disorders of veins: Secondary | ICD-10-CM

## 2017-02-15 DIAGNOSIS — E039 Hypothyroidism, unspecified: Secondary | ICD-10-CM | POA: Diagnosis not present

## 2017-02-15 LAB — CBC WITH DIFFERENTIAL/PLATELET
BASO%: 0.2 % (ref 0.0–2.0)
BASOS ABS: 0 10*3/uL (ref 0.0–0.1)
EOS ABS: 0.2 10*3/uL (ref 0.0–0.5)
EOS%: 2.7 % (ref 0.0–7.0)
HEMATOCRIT: 42.7 % (ref 38.4–49.9)
HGB: 14.5 g/dL (ref 13.0–17.1)
LYMPH#: 1.2 10*3/uL (ref 0.9–3.3)
LYMPH%: 21.8 % (ref 14.0–49.0)
MCH: 30.7 pg (ref 27.2–33.4)
MCHC: 34 g/dL (ref 32.0–36.0)
MCV: 90.3 fL (ref 79.3–98.0)
MONO#: 0.6 10*3/uL (ref 0.1–0.9)
MONO%: 10.4 % (ref 0.0–14.0)
NEUT#: 3.6 10*3/uL (ref 1.5–6.5)
NEUT%: 64.9 % (ref 39.0–75.0)
PLATELETS: 174 10*3/uL (ref 140–400)
RBC: 4.73 10*6/uL (ref 4.20–5.82)
RDW: 13.9 % (ref 11.0–14.6)
WBC: 5.5 10*3/uL (ref 4.0–10.3)
nRBC: 0 % (ref 0–0)

## 2017-02-15 LAB — COMPREHENSIVE METABOLIC PANEL
ALT: 32 U/L (ref 0–55)
ANION GAP: 10 meq/L (ref 3–11)
AST: 31 U/L (ref 5–34)
Albumin: 4.2 g/dL (ref 3.5–5.0)
Alkaline Phosphatase: 50 U/L (ref 40–150)
BILIRUBIN TOTAL: 0.3 mg/dL (ref 0.20–1.20)
BUN: 15.4 mg/dL (ref 7.0–26.0)
CHLORIDE: 103 meq/L (ref 98–109)
CO2: 27 mEq/L (ref 22–29)
CREATININE: 0.9 mg/dL (ref 0.7–1.3)
Calcium: 9.5 mg/dL (ref 8.4–10.4)
EGFR: >90 mL/min/{1.73_m2} (ref >90)
GLUCOSE: 117 mg/dL (ref 70–140)
Potassium: 3.8 mEq/L (ref 3.5–5.1)
Sodium: 140 mEq/L (ref 136–145)
TOTAL PROTEIN: 7 g/dL (ref 6.4–8.3)

## 2017-02-15 LAB — TSH: TSH: 4.527 m[IU]/L — AB (ref 0.320–4.118)

## 2017-02-15 MED ORDER — SODIUM CHLORIDE 0.9 % IJ SOLN
10.0000 mL | INTRAMUSCULAR | Status: AC | PRN
  Administered 2017-02-15: 10 mL
  Filled 2017-02-15: qty 10

## 2017-02-15 MED ORDER — ANTICOAGULANT SODIUM CITRATE 4% (200MG/5ML) IV SOLN
5.0000 mL | Freq: Once | Status: AC
  Administered 2017-02-15: 5 mL via INTRAVENOUS
  Filled 2017-02-15: qty 5

## 2017-02-15 MED ORDER — SODIUM CHLORIDE 0.9 % IV SOLN
Freq: Once | INTRAVENOUS | Status: AC
  Administered 2017-02-15: 12:00:00 via INTRAVENOUS

## 2017-02-15 MED ORDER — SODIUM CHLORIDE 0.9 % IJ SOLN
10.0000 mL | INTRAMUSCULAR | Status: DC | PRN
  Administered 2017-02-15: 10 mL
  Filled 2017-02-15: qty 10

## 2017-02-15 MED ORDER — SODIUM CHLORIDE 0.9 % IV SOLN
240.0000 mg | Freq: Once | INTRAVENOUS | Status: AC
  Administered 2017-02-15: 240 mg via INTRAVENOUS
  Filled 2017-02-15: qty 24

## 2017-02-15 NOTE — Telephone Encounter (Signed)
Gave patient AVS and calender per 5/14 los  

## 2017-02-15 NOTE — Patient Instructions (Signed)
[facility] Discharge Instructions for Patients Receiving Chemotherapy  Today you received the following chemotherapy agents:  Nivolumab.  To help prevent nausea and vomiting after your treatment, we encourage you to take your nausea medication as directed.   If you develop nausea and vomiting that is not controlled by your nausea medication, call the clinic.   BELOW ARE SYMPTOMS THAT SHOULD BE REPORTED IMMEDIATELY:  *FEVER GREATER THAN 100.5 F  *CHILLS WITH OR WITHOUT FEVER  NAUSEA AND VOMITING THAT IS NOT CONTROLLED WITH YOUR NAUSEA MEDICATION  *UNUSUAL SHORTNESS OF BREATH  *UNUSUAL BRUISING OR BLEEDING  TENDERNESS IN MOUTH AND THROAT WITH OR WITHOUT PRESENCE OF ULCERS  *URINARY PROBLEMS  *BOWEL PROBLEMS  UNUSUAL RASH Items with * indicate a potential emergency and should be followed up as soon as possible.  Feel free to call the clinic you have any questions or concerns. The clinic phone number is [phone].  Please show the CHEMO ALERT CARD at check-in to the Emergency Department and triage nurse.   

## 2017-02-16 NOTE — Progress Notes (Signed)
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 Pan-endoscopy elsewhere showed cancer from nasopharyngeal space.      01/04/2007 - 02/20/2007 Chemotherapy    He received 2 cycles of cisplatin and 5FU followed by concurrent chemo with weekly cisplatin and radiation. He only received 2 doses of chemo due to severe mucositis, nausea and weight loss.      04/05/2007 - 08/04/2007 Chemotherapy    He received 4 more courses of cisplatin with 5FU and had complete response      07/05/2009 Procedure    Fine-needle aspirate of the right level II lymph nodes come from recurrent metastatic disease. Repeat endoscopy and CT scan show no evidence of disease elsewhere.      07/08/2009 - 12/02/2009 Chemotherapy    He was given 6 cycles of carboplatin, 5-FU and docetaxel      12/03/2009 Surgery    He has surgery to the residual lymph node on the right neck which showed no evidence of disease.      02/22/2012 Imaging    Repeat imaging study showed large recurrent mass. He was referred elsewhere for further treatment.      05/03/2012 Surgery    He underwent left upper lobectomy.      04/29/2013 Imaging    PEt scan showed lesion on right level II B and lower lung was abnormal      06/03/2013 - 02/02/2014 Chemotherapy    He had 6 cycles of chemotherapy when he was found to have recurrence of cancer and had received oxaliplatin and capecitabine      06/07/2014 Imaging    PET CT scan showed persistent disease in the right neck lymph nodes and left lung      06/29/2014 Procedure    Accession: [id number] repeat LUL biopsy confirmed metastatic cancer      07/18/2014 - 07/31/2014 Radiation Therapy    He received palliative radiation therapy to the lungs      10/10/2014 Imaging    CT scan of the chest, abdomen and pelvis show regression in the size of the lung nodule in the left upper lobe and stable pulmonary nodules      01/24/2015 Imaging    CT scan showed stable disease in neck and lung      06/19/2015 Imaging    CT scan of the neck and the chest show possible mild progression of the nodule in the right side of the neck.      06/25/2015 Imaging    PET scan confirmed disease recurrence in the neck      07/07/2015 Imaging    He had MRI  neck at [facility]      09/03/2015 -  Chemotherapy    He received palliative chemo with Nivolumab      10/29/2015 Imaging    PET CT showed positive response to Rx      02/28/2016 Imaging    Ct abdomen showed abnormal thinkening in his stomach      03/03/2016 Imaging    CT: Right sternocleidomastoid muscle metastasis appears less distinct but otherwise not significantly changed in size or configuration since 06/19/2015.2. Left level 3 lymph node which was hypermetabolic by PET-CT in January 2017 appears slightly smaller      04/01/2016 Imaging    CT cervical spine showed no acute fracture or traumatic malalignment in the cervical spine      04/22/2016 Procedure    Port-a-cath placed.      06/16/2016 Imaging    Ct neck showed right sternocleidomastoid muscle metastasis  is further decreased in conspicuity since May, and has mildly decreased in size since September 2016. Continued stability of sub-centimeter left cervical lymph nodes. No new or progressive metastatic disease in the neck.      06/16/2016 Imaging    CT chest showed stable masslike radiation fibrosis in the left upper lobe. Stable subcentimeter pulmonary nodules in the bilateral lower lobes. No new or progressive metastatic disease in the chest. Nonobstructing left renal stone.      10/13/2016 Imaging    Ct neck showed unchanged right sternocleidomastoid muscle metastasis. Unchanged subcentimeter left cervical lymph nodes. No evidence of new or progressive metastatic disease in the neck.      10/13/2016 Imaging    CT chest showed tiny hypervascular foci in the liver, not definitely seen on prior imaging of 06/16/2016 and 02/28/2016. Abdomen MRI without and with contrast recommended to further evaluate as metastatic disease is a concern. 2. Stable appearance of post treatment changes left upper lung and scattered tiny bilateral pulmonary nodules.      02/11/2017 Imaging    Ct neck: Lymph node mass right posterior neck appears improved from the prior study. Small posterior lymph nodes on the left unchanged. Occluded right jugular vein unchanged.      02/11/2017 Imaging    1. Similar appearance of postsurgical and radiation changes in the left upper lobe. 2. Similar bilateral pulmonary nodules. 3. No thoracic adenopathy. 4. Subtle foci of post-contrast enhancement within the liver are suboptimally characterized on this nondedicated study. Likely similar. These could either be re-evaluated at followup or more entirely characterized with abdominal MRI. 5. Left nephrolithiasis.       INTERVAL HISTORY: Please see below for problem oriented charting. He returns for further treatment. His neck pain is stable.  He is not using pain medicine as directed. He is inquiring about immunotherapy and MMR  vaccine. He is enrolling college. He is compliant taking his thyroid medicine as directed  REVIEW OF SYSTEMS:   Constitutional: Denies fevers, chills or abnormal weight loss Eyes: Denies blurriness of vision Ears, nose, mouth, throat, and face: Denies mucositis or sore throat Respiratory: Denies cough, dyspnea or wheezes Cardiovascular: Denies palpitation, chest discomfort or lower extremity swelling Gastrointestinal:  Denies nausea, heartburn or change in bowel habits Skin: Denies abnormal skin rashes Lymphatics: Denies new lymphadenopathy or easy bruising Neurological:Denies numbness, tingling or new weaknesses Behavioral/Psych: Mood is stable, no new changes  All other systems were reviewed with the patient and are negative.  I have reviewed the past medical history, past surgical history, social history and family history with the patient and  they are unchanged from previous note.  ALLERGIES:  is allergic to heparin.  MEDICATIONS:  Current Outpatient Prescriptions  Medication Sig Dispense Refill  . diclofenac (VOLTAREN) 50 MG EC tablet Take 1 tablet (50 mg total) by mouth 3 (three) times daily. 60 tablet 1  . levothyroxine (SYNTHROID, LEVOTHROID) 100 MCG tablet TAKE 1 TABLET BY MOUTH DAILY BEFORE BREAKFAST. ALTERNATE WITH 75MCG 90 tablet 9  . levothyroxine (SYNTHROID, LEVOTHROID) 75 MCG tablet TAKE 1 TABLET BY MOUTH EVERY DAY BEFORE BREAKFAST 90 tablet 0  . lidocaine-prilocaine (EMLA) cream Apply 1 application topically as needed. 30 g 3  . meclizine (ANTIVERT) 25 MG tablet Take 1 tablet (25 mg total) by mouth 3 (three) times daily as needed for dizziness. 30 tablet 0  . ondansetron (ZOFRAN) 8 MG tablet Take 1 tablet (8 mg total) by mouth every 8 (eight) hours as needed for nausea or vomiting. 90 tablet 1  . oxyCODONE (ROXICODONE) 15 MG immediate release tablet Take 1 tablet (15 mg total) by mouth every 4 (four) hours as needed for pain. 60 tablet 0   No current  facility-administered medications for this visit.    Facility-Administered Medications Ordered in Other Visits  Medication Dose Route Frequency Provider Last Rate Last Dose  . sodium chloride 0.9 % injection 10 mL  10 mL Intracatheter PRN Alvy Bimler, Quinn Bartling, MD        PHYSICAL EXAMINATION: ECOG PERFORMANCE STATUS: 1 - Symptomatic but completely ambulatory  Vitals:   02/15/17 1047  BP: 116/67  Pulse: 72  Resp: 18  Temp: 97.8 F (36.6 C)   Filed Weights   02/15/17 1047  Weight: 174 lb 6.4 oz (79.1 kg)    GENERAL:alert, no distress and comfortable SKIN: skin color, texture, turgor are normal, no rashes or significant lesions EYES: normal, Conjunctiva are pink and non-injected, sclera clear OROPHARYNX:no exudate, no erythema and lips, buccal mucosa, and tongue normal  NECK: supple, thyroid normal size, non-tender, without nodularity LYMPH:  no palpable lymphadenopathy in the cervical, axillary or inguinal LUNGS: clear to auscultation and percussion with normal breathing effort HEART: regular rate & rhythm and no murmurs and no lower extremity edema ABDOMEN:abdomen soft, non-tender and normal bowel sounds Musculoskeletal:no cyanosis of digits and no clubbing  NEURO: alert & oriented x 3 with fluent speech, no focal motor/sensory deficits  LABORATORY DATA:  I have reviewed the data as listed    Component Value Date/Time   NA 140 02/15/2017 1018   K 3.8 02/15/2017 1018   CL 104 06/29/2014 0655   CO2 27 02/15/2017 1018   GLUCOSE 117 02/15/2017 1018   BUN 15.4 02/15/2017 1018   CREATININE 0.9 02/15/2017 1018   CALCIUM 9.5 02/15/2017 1018   PROT 7.0 02/15/2017 1018   ALBUMIN 4.2 02/15/2017 1018   AST 31 02/15/2017 1018   ALT 32 02/15/2017 1018   ALKPHOS 50 02/15/2017 1018   BILITOT 0.30 02/15/2017 1018   GFRNONAA >90 06/29/2014 0655   GFRAA >90 06/29/2014 0655    No results found for: SPEP, UPEP  Lab Results  Component Value Date   WBC 5.5 02/15/2017   NEUTROABS 3.6  02/15/2017   HGB 14.5 02/15/2017   HCT 42.7 02/15/2017   MCV 90.3 02/15/2017   PLT 174 02/15/2017      Chemistry      Component Value Date/Time   NA 140 02/15/2017 1018   K 3.8 02/15/2017 1018   CL 104 06/29/2014 0655   CO2 27 02/15/2017 1018   BUN 15.4 02/15/2017  1018   CREATININE 0.9 02/15/2017 1018      Component Value Date/Time   CALCIUM 9.5 02/15/2017 1018   ALKPHOS 50 02/15/2017 1018   AST 31 02/15/2017 1018   ALT 32 02/15/2017 1018   BILITOT 0.30 02/15/2017 1018       RADIOGRAPHIC STUDIES: I have personally reviewed the radiological images as listed and agreed with the findings in the report. Dg Chest 1 View  Result Date: 02/04/2017 CLINICAL DATA:  Positive PPD.  No chest complaints. EXAM: CHEST 1 VIEW COMPARISON:  Chest x-ray November 2015 and CT scan October 13, 2016. FINDINGS: Post treatment changes in the left upper lobe are unchanged since October 13, 2016. Tiny nodule seen on the CT scan are not appreciated on this study. The right Port-A-Cath is stable. The heart, right hilum, and mediastinum are unchanged. No focal infiltrate. IMPRESSION: Stable postsurgical changes in the left upper lobe. No acute abnormality. Electronically Signed   By: Dorise Bullion III M.D   On: 02/04/2017 10:53   Ct Soft Tissue Neck W Contrast  Result Date: 02/11/2017 CLINICAL DATA:  Staging nasopharyngeal cancer.  Exclude recurrence. EXAM: CT NECK WITH CONTRAST TECHNIQUE: Multidetector CT imaging of the neck was performed using the standard protocol following the bolus administration of intravenous contrast. CONTRAST:  < 75 mL > ISOVUE-300 IOPAMIDOL (ISOVUE-300) INJECTION 61% COMPARISON:  CT neck 01/21/2017, 06/16/2016, PET 10/29/2015 FINDINGS: Pharynx and larynx: Negative for mass or edema.  Normal airway. Salivary glands: Atrophic submandibular and parotid glands possibly due to radiation treatment. No mass lesion. Thyroid: Negative Lymph nodes: Matted lymph node mass in the right posterior  neck is difficult to measure due adjacent musculature but is approximately 2.2 x 2.8 cm. This has improved from the prior study. Posterior lymph nodes on the left measure 4.67m, 3.456m and 5.7 mm similar to the prior study. These may be due to treated tumor. Vascular: Right jugular vein not visualized and may be occluded. Left jugular vein patent. Carotid artery patent bilaterally Limited intracranial: Negative Visualized orbits: Negative Mastoids and visualized paranasal sinuses: Mucosal edema in the paranasal sinuses bilaterally. Skeleton: Extensive dental disease with periapical lucencies around multiple teeth. No skeletal metastasis. Upper chest: Chest CT reported separately Other: None IMPRESSION: Lymph node mass right posterior neck appears improved from the prior study. Small posterior lymph nodes on the left unchanged. Occluded right jugular vein unchanged. Electronically Signed   By: ChFranchot Gallo.D.   On: 02/11/2017 13:09   Ct Chest W Contrast  Result Date: 02/11/2017 CLINICAL DATA:  Nasopharyngeal cancer diagnosed in 2007. Lung metastasis diagnosed in 2013. Ongoing immunotherapy. EXAM: CT CHEST WITH CONTRAST TECHNIQUE: Multidetector CT imaging of the chest was performed during intravenous contrast administration. CONTRAST:  <See Chart> ISOVUE-300 IOPAMIDOL (ISOVUE-300) INJECTION 61% COMPARISON:  Chest radiograph of 02/04/2017. Chest CT of 10/13/2016. FINDINGS: Cardiovascular: A right-sided Port-A-Cath which terminates at the high right atrium. Normal heart size, without pericardial effusion. No central pulmonary embolism, on this non-dedicated study. Mediastinum/Nodes: No mediastinal or hilar adenopathy. Lungs/Pleura: No pleural fluid. Right lower lobe 4 mm nodule is unchanged on image 83/series 8 A 3 mm more anterior right lower lobe nodule on image 76/series 8 is also not significantly changed. Subpleural left lower lobe nodule is similar at 6 mm on image 79/series 8. Surgical sutures within the  left upper lobe. Surrounding radiation induced fibrosis. No locally recurrent disease. An area of soft tissue thickening about the posterior aspect of the surgical sutures and presumed radiation change measures on  the order of 1.7 x 1.3 cm on image 45/series 8. This is unchanged. Upper Abdomen: Suspicion of hyperenhancing foci within the liver. Example within the high right lobe at on image 46/ series 7, similar. Possible focus of hyper enhancement within the anterior left lobe including on image 50/ series 7. This may have been present on the prior. Normal imaged portions of the spleen, stomach, pancreas, gallbladder, adrenal glands, right kidney. Upper pole 4 mm left renal collecting system calculus. Musculoskeletal: No acute osseous abnormality. IMPRESSION: 1. Similar appearance of postsurgical and radiation changes in the left upper lobe. 2. Similar bilateral pulmonary nodules. 3. No thoracic adenopathy. 4. Subtle foci of post-contrast enhancement within the liver are suboptimally characterized on this nondedicated study. Likely similar. These could either be re-evaluated at followup or more entirely characterized with abdominal MRI. 5. Left nephrolithiasis. Electronically Signed   By: Abigail Miyamoto M.D.   On: 02/11/2017 14:07    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[employer]) The patient has excellent response to treatment. He tolerated treatment well with no side effects CT scan of the neck and chest were reviewed with the patient which showed no evidence of active disease. I recommend we transitioned him to once a month treatment indefinitely. I plan to continue surveillance imaging study every 6 months, next due in November 2018.   Metastasis to lung He is not symptomatic. Observe only CT chest  showed no evidence of disease progression I plan to repeat CT chest again in 6 months, next due November 2018  Acquired hypothyroidism He complained of recent fatigue. Repeat TSH showed  improvement since recent dose adjustment He will continue 100 MCG of levothyroxine for now   Orders Placed This Encounter  Procedures  . TSH    Standing Status:   Standing    Number of Occurrences:   11    Standing Expiration Date:   02/16/2018   All questions were answered. The patient knows to call the clinic with any problems, questions or concerns. No barriers to learning was detected. I spent 15 minutes counseling the patient face to face. The total time spent in the appointment was 20 minutes and more than 50% was on counseling and review of test results     [name], MD 02/16/2017 8:58 AM

## 2017-02-16 NOTE — Assessment & Plan Note (Signed)
The patient has excellent response to treatment. He tolerated treatment well with no side effects CT scan of the neck and chest were reviewed with the patient which showed no evidence of active disease. I recommend we transitioned him to once a month treatment indefinitely. I plan to continue surveillance imaging study every 6 months, next due in November 2018.  

## 2017-02-16 NOTE — Assessment & Plan Note (Signed)
He is not symptomatic. Observe only CT chest  showed no evidence of disease progression I plan to repeat CT chest again in 6 months, next due November 2018 

## 2017-02-16 NOTE — Assessment & Plan Note (Signed)
He complained of recent fatigue. Repeat TSH showed improvement since recent dose adjustment He will continue 100 MCG of levothyroxine for now

## 2017-03-15 ENCOUNTER — Ambulatory Visit (HOSPITAL_BASED_OUTPATIENT_CLINIC_OR_DEPARTMENT_OTHER): Payer: Medicaid Other | Admitting: Hematology and Oncology

## 2017-03-15 ENCOUNTER — Ambulatory Visit: Payer: Medicaid Other

## 2017-03-15 ENCOUNTER — Other Ambulatory Visit (HOSPITAL_BASED_OUTPATIENT_CLINIC_OR_DEPARTMENT_OTHER): Payer: Medicaid Other

## 2017-03-15 ENCOUNTER — Telehealth: Payer: Self-pay | Admitting: Hematology and Oncology

## 2017-03-15 ENCOUNTER — Encounter: Payer: Self-pay | Admitting: Hematology and Oncology

## 2017-03-15 ENCOUNTER — Other Ambulatory Visit: Payer: Self-pay | Admitting: Hematology and Oncology

## 2017-03-15 ENCOUNTER — Ambulatory Visit (HOSPITAL_BASED_OUTPATIENT_CLINIC_OR_DEPARTMENT_OTHER): Payer: Medicaid Other

## 2017-03-15 ENCOUNTER — Telehealth: Payer: Self-pay

## 2017-03-15 DIAGNOSIS — C7989 Secondary malignant neoplasm of other specified sites: Secondary | ICD-10-CM | POA: Diagnosis not present

## 2017-03-15 DIAGNOSIS — M542 Cervicalgia: Secondary | ICD-10-CM

## 2017-03-15 DIAGNOSIS — C760 Malignant neoplasm of head, face and neck: Secondary | ICD-10-CM

## 2017-03-15 DIAGNOSIS — E039 Hypothyroidism, unspecified: Secondary | ICD-10-CM

## 2017-03-15 DIAGNOSIS — Z5112 Encounter for antineoplastic immunotherapy: Secondary | ICD-10-CM | POA: Diagnosis present

## 2017-03-15 DIAGNOSIS — C7800 Secondary malignant neoplasm of unspecified lung: Secondary | ICD-10-CM | POA: Diagnosis not present

## 2017-03-15 DIAGNOSIS — I878 Other specified disorders of veins: Secondary | ICD-10-CM

## 2017-03-15 LAB — COMPREHENSIVE METABOLIC PANEL
ALT: 22 U/L (ref 0–55)
AST: 24 U/L (ref 5–34)
Albumin: 4.3 g/dL (ref 3.5–5.0)
Alkaline Phosphatase: 49 U/L (ref 40–150)
Anion Gap: 12 mEq/L — ABNORMAL HIGH (ref 3–11)
BUN: 17.1 mg/dL (ref 7.0–26.0)
CALCIUM: 9.7 mg/dL (ref 8.4–10.4)
CHLORIDE: 105 meq/L (ref 98–109)
CO2: 25 mEq/L (ref 22–29)
Creatinine: 1 mg/dL (ref 0.7–1.3)
EGFR: >90 mL/min/{1.73_m2} (ref >90)
Glucose: 140 mg/dl (ref 70–140)
POTASSIUM: 4.1 meq/L (ref 3.5–5.1)
Sodium: 142 mEq/L (ref 136–145)
Total Bilirubin: 0.42 mg/dL (ref 0.20–1.20)
Total Protein: 7.1 g/dL (ref 6.4–8.3)

## 2017-03-15 LAB — CBC WITH DIFFERENTIAL/PLATELET
BASO%: 1 % (ref 0.0–2.0)
Basophils Absolute: 0.1 10*3/uL (ref 0.0–0.1)
EOS ABS: 0.1 10*3/uL (ref 0.0–0.5)
EOS%: 2.1 % (ref 0.0–7.0)
HEMATOCRIT: 43.3 % (ref 38.4–49.9)
HEMOGLOBIN: 14.9 g/dL (ref 13.0–17.1)
LYMPH%: 26.3 % (ref 14.0–49.0)
MCH: 31 pg (ref 27.2–33.4)
MCHC: 34.4 g/dL (ref 32.0–36.0)
MCV: 90.1 fL (ref 79.3–98.0)
MONO#: 0.6 10*3/uL (ref 0.1–0.9)
MONO%: 9.6 % (ref 0.0–14.0)
NEUT%: 61 % (ref 39.0–75.0)
NEUTROS ABS: 3.6 10*3/uL (ref 1.5–6.5)
PLATELETS: 202 10*3/uL (ref 140–400)
RBC: 4.8 10*6/uL (ref 4.20–5.82)
RDW: 14.5 % (ref 11.0–14.6)
WBC: 5.9 10*3/uL (ref 4.0–10.3)
lymph#: 1.5 10*3/uL (ref 0.9–3.3)

## 2017-03-15 LAB — TSH: TSH: 5.795 m(IU)/L — ABNORMAL HIGH (ref 0.320–4.118)

## 2017-03-15 MED ORDER — LEVOTHYROXINE SODIUM 100 MCG PO TABS: 100.0000 ug | ORAL_TABLET | Freq: Every day | ORAL | 9 refills | Status: DC

## 2017-03-15 MED ORDER — SODIUM CHLORIDE 0.9 % IJ SOLN
10.0000 mL | INTRAMUSCULAR | Status: AC | PRN
  Administered 2017-03-15: 10 mL
  Filled 2017-03-15: qty 10

## 2017-03-15 MED ORDER — ANTICOAGULANT SODIUM CITRATE 4% (200MG/5ML) IV SOLN
5.0000 mL | Status: DC | PRN
  Administered 2017-03-15: 5 mL via INTRAVENOUS
  Filled 2017-03-15: qty 5

## 2017-03-15 MED ORDER — SODIUM CHLORIDE 0.9 % IJ SOLN
10.0000 mL | INTRAMUSCULAR | Status: DC | PRN
  Administered 2017-03-15: 10 mL
  Filled 2017-03-15: qty 10

## 2017-03-15 MED ORDER — SODIUM CHLORIDE 0.9 % IV SOLN
Freq: Once | INTRAVENOUS | Status: AC
  Administered 2017-03-15: 11:00:00 via INTRAVENOUS

## 2017-03-15 MED ORDER — SODIUM CHLORIDE 0.9 % IV SOLN
240.0000 mg | Freq: Once | INTRAVENOUS | Status: AC
  Administered 2017-03-15: 240 mg via INTRAVENOUS
  Filled 2017-03-15: qty 24

## 2017-03-15 NOTE — Telephone Encounter (Signed)
Called with below message. 

## 2017-03-15 NOTE — Telephone Encounter (Signed)
-----   Message from [name], MD sent at 03/15/2017 11:38 AM EDT ----- Regarding: TSH Please let him know his TSh is still high I recommend increasing to 100 mcg (current dose is 75 alternate with 100).  I will send prescription to his pharmacist ----- Message ----- From: Interface, Lab In Three Zero One Sent: 03/15/2017  10:01 AM To: [name], MD

## 2017-03-15 NOTE — Patient Instructions (Signed)

## 2017-03-15 NOTE — Assessment & Plan Note (Signed)
He complained of recent fatigue. TSH is abnormal.  I will adjust his medications He will continue 100 MCG of levothyroxine for now

## 2017-03-15 NOTE — Assessment & Plan Note (Signed)
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 I recommend continue conservative management with physical therapy, topical lidocaine and pain medicine as needed

## 2017-03-15 NOTE — Assessment & Plan Note (Signed)
He is not symptomatic. Observe only CT chest  showed no evidence of disease progression I plan to repeat CT chest again in 6 months, next due November 2018

## 2017-03-15 NOTE — Assessment & Plan Note (Signed)
The patient has excellent response to treatment. He tolerated treatment well with no side effects CT scan of the neck and chest were reviewed with the patient which showed no evidence of active disease. I recommend we transitioned him to once a month treatment indefinitely. I plan to continue surveillance imaging study every 6 months, next due in November 2018.

## 2017-03-15 NOTE — Patient Instructions (Signed)
[facility] Discharge Instructions for Patients Receiving Chemotherapy  Today you received the following chemotherapy agents nivolumab   To help prevent nausea and vomiting after your treatment, we encourage you to take your nausea medication as directed   If you develop nausea and vomiting that is not controlled by your nausea medication, call the clinic.   BELOW ARE SYMPTOMS THAT SHOULD BE REPORTED IMMEDIATELY:  *FEVER GREATER THAN 100.5 F  *CHILLS WITH OR WITHOUT FEVER  NAUSEA AND VOMITING THAT IS NOT CONTROLLED WITH YOUR NAUSEA MEDICATION  *UNUSUAL SHORTNESS OF BREATH  *UNUSUAL BRUISING OR BLEEDING  TENDERNESS IN MOUTH AND THROAT WITH OR WITHOUT PRESENCE OF ULCERS  *URINARY PROBLEMS  *BOWEL PROBLEMS  UNUSUAL RASH Items with * indicate a potential emergency and should be followed up as soon as possible.  Feel free to call the clinic you have any questions or concerns. The clinic phone number is [phone].  

## 2017-03-15 NOTE — Progress Notes (Signed)
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 Pan-endoscopy elsewhere showed cancer from nasopharyngeal space.      01/04/2007 - 02/20/2007 Chemotherapy    He received 2 cycles of cisplatin and 5FU followed by concurrent chemo with weekly cisplatin and radiation. He only received 2 doses of chemo due to severe mucositis, nausea and weight loss.      04/05/2007 - 08/04/2007 Chemotherapy    He received 4 more courses of cisplatin with 5FU and had complete response      07/05/2009 Procedure    Fine-needle aspirate of the right level II lymph nodes come from recurrent metastatic disease. Repeat endoscopy and CT scan show no evidence of disease elsewhere.      07/08/2009 - 12/02/2009 Chemotherapy    He was given 6 cycles of carboplatin, 5-FU and docetaxel      12/03/2009 Surgery    He has surgery to the residual lymph node on the right neck which showed no evidence of disease.      02/22/2012 Imaging    Repeat imaging study showed large recurrent mass. He was referred elsewhere for further treatment.       05/03/2012 Surgery    He underwent left upper lobectomy.      04/29/2013 Imaging    PEt scan showed lesion on right level II B and lower lung was abnormal      06/03/2013 - 02/02/2014 Chemotherapy    He had 6 cycles of chemotherapy when he was found to have recurrence of cancer and had received oxaliplatin and capecitabine      06/07/2014 Imaging    PET CT scan showed persistent disease in the right neck lymph nodes and left lung      06/29/2014 Procedure    Accession: [id number] repeat LUL biopsy confirmed metastatic cancer      07/18/2014 - 07/31/2014 Radiation Therapy    He received palliative radiation therapy to the lungs      10/10/2014 Imaging    CT scan of the chest, abdomen and pelvis show regression in the size of the lung nodule in the left upper lobe and stable pulmonary nodules      01/24/2015 Imaging    CT scan showed stable disease in neck and lung      06/19/2015 Imaging    CT scan of the neck and the chest show possible mild progression of the nodule in the right side of the neck.      06/25/2015 Imaging    PET scan confirmed disease recurrence in the neck      07/07/2015 Imaging    He had  MRI neck at [facility]      09/03/2015 -  Chemotherapy    He received palliative chemo with Nivolumab      10/29/2015 Imaging    PET CT showed positive response to Rx      02/28/2016 Imaging    Ct abdomen showed abnormal thinkening in his stomach      03/03/2016 Imaging    CT: Right sternocleidomastoid muscle metastasis appears less distinct but otherwise not significantly changed in size or configuration since 06/19/2015.2. Left level 3 lymph node which was hypermetabolic by PET-CT in January 2017 appears slightly smaller      04/01/2016 Imaging    CT cervical spine showed no acute fracture or traumatic malalignment in the cervical spine      04/22/2016 Procedure    Port-a-cath placed.      06/16/2016 Imaging    Ct neck showed right sternocleidomastoid muscle metastasis  is further decreased in conspicuity since May, and has mildly decreased in size since September 2016. Continued stability of sub-centimeter left cervical lymph nodes. No new or progressive metastatic disease in the neck.      06/16/2016 Imaging    CT chest showed stable masslike radiation fibrosis in the left upper lobe. Stable subcentimeter pulmonary nodules in the bilateral lower lobes. No new or progressive metastatic disease in the chest. Nonobstructing left renal stone.      10/13/2016 Imaging    Ct neck showed unchanged right sternocleidomastoid muscle metastasis. Unchanged subcentimeter left cervical lymph nodes. No evidence of new or progressive metastatic disease in the neck.      10/13/2016 Imaging    CT chest showed tiny hypervascular foci in the liver, not definitely seen on prior imaging of 06/16/2016 and 02/28/2016. Abdomen MRI without and with contrast recommended to further evaluate as metastatic disease is a concern. 2. Stable appearance of post treatment changes left upper lung and scattered tiny bilateral pulmonary nodules.      02/11/2017 Imaging    Ct neck: Lymph node mass right posterior neck appears improved from the prior study. Small posterior lymph nodes on the left unchanged. Occluded right jugular vein unchanged.      02/11/2017 Imaging    1. Similar appearance of postsurgical and radiation changes in the left upper lobe. 2. Similar bilateral pulmonary nodules. 3. No thoracic adenopathy. 4. Subtle foci of post-contrast enhancement within the liver are suboptimally characterized on this nondedicated study. Likely similar. These could either be re-evaluated at followup or more entirely characterized with abdominal MRI. 5. Left nephrolithiasis.       INTERVAL HISTORY: Please see below for problem oriented charting. The patient is seen prior to treatment today He denies recent cough, chest pain or shortness of breath He continues to have chronic neck pain,  stable His energy level is stable He denies new neck mass  REVIEW OF SYSTEMS:   Constitutional: Denies fevers, chills or abnormal weight loss Eyes: Denies blurriness of vision Ears, nose, mouth, throat, and face: Denies mucositis or sore throat Respiratory: Denies cough, dyspnea or wheezes Cardiovascular: Denies palpitation, chest discomfort or lower extremity swelling Gastrointestinal:  Denies nausea, heartburn or change in bowel habits Skin: Denies abnormal skin rashes Lymphatics: Denies new lymphadenopathy or easy bruising Neurological:Denies numbness, tingling or new weaknesses Behavioral/Psych: Mood is stable, no new changes  All other systems were reviewed with the patient and are negative.  I have reviewed the past medical history, past surgical history, social history and family history with the patient and they are unchanged  from previous note.  ALLERGIES:  is allergic to heparin.  MEDICATIONS:  Current Outpatient Prescriptions  Medication Sig Dispense Refill  . diclofenac (VOLTAREN) 50 MG EC tablet Take 1 tablet (50 mg total) by mouth 3 (three) times daily. 60 tablet 1  . lidocaine-prilocaine (EMLA) cream Apply 1 application topically as needed. 30 g 3  . meclizine (ANTIVERT) 25 MG tablet Take 1 tablet (25 mg total) by mouth 3 (three) times daily as needed for dizziness. 30 tablet 0  . oxyCODONE (ROXICODONE) 15 MG immediate release tablet Take 1 tablet (15 mg total) by mouth every 4 (four) hours as needed for pain. 60 tablet 0  . levothyroxine (SYNTHROID, LEVOTHROID) 100 MCG tablet Take 1 tablet (100 mcg total) by mouth daily before breakfast. 90 tablet 9  . ondansetron (ZOFRAN) 8 MG tablet Take 1 tablet (8 mg total) by mouth every 8 (eight) hours as needed for nausea or vomiting. (Patient not taking: Reported on 03/15/2017) 90 tablet 1   No current facility-administered medications for this visit.    Facility-Administered Medications Ordered in Other Visits  Medication  Dose Route Frequency Provider Last Rate Last Dose  . anticoagulant sodium citrate solution 5 mL  5 mL Intravenous PRN Alvy Bimler, Brittanny Levenhagen, MD   5 mL at 03/15/17 1149  . sodium chloride 0.9 % injection 10 mL  10 mL Intracatheter PRN Alvy Bimler, Zoriyah Scheidegger, MD      . sodium chloride 0.9 % injection 10 mL  10 mL Intracatheter PRN Alvy Bimler, Alorah Mcree, MD   10 mL at 03/15/17 1149    PHYSICAL EXAMINATION: ECOG PERFORMANCE STATUS: 1 - Symptomatic but completely ambulatory  Vitals:   03/15/17 1002  BP: 129/66  Pulse: 76  Resp: 20  Temp: 98.5 F (36.9 C)   Filed Weights   03/15/17 1002  Weight: 174 lb 1.6 oz (79 kg)    GENERAL:alert, no distress and comfortable SKIN: skin color, texture, turgor are normal, no rashes or significant lesions EYES: normal, Conjunctiva are pink and non-injected, sclera clear OROPHARYNX:no exudate, no erythema and lips, buccal mucosa, and tongue normal  NECK: Noted well-healed surgical scar, stable from prior exam  LYMPH:  no palpable lymphadenopathy in the cervical, axillary or inguinal LUNGS: clear to auscultation and percussion with normal breathing effort HEART: regular rate & rhythm and no murmurs and no lower extremity edema ABDOMEN:abdomen soft, non-tender and normal bowel sounds Musculoskeletal:no cyanosis of digits and no clubbing  NEURO: alert & oriented x 3 with fluent speech, no focal motor/sensory deficits  LABORATORY DATA:  I have reviewed the data as listed    Component Value Date/Time   NA 142 03/15/2017 0927   K 4.1 03/15/2017 0927   CL 104 06/29/2014 0655   CO2 25 03/15/2017 0927   GLUCOSE 140 03/15/2017 0927   BUN 17.1 03/15/2017 0927   CREATININE 1.0 03/15/2017 0927   CALCIUM 9.7 03/15/2017 0927   PROT 7.1 03/15/2017 0927   ALBUMIN 4.3 03/15/2017 0927   AST 24 03/15/2017 0927   ALT 22 03/15/2017 0927   ALKPHOS 49 03/15/2017 0927   BILITOT 0.42 03/15/2017 0927   GFRNONAA >90 06/29/2014 0655   GFRAA >90 06/29/2014 0655    No results found for: SPEP,  UPEP  Lab Results  Component Value Date   WBC 5.9 03/15/2017   NEUTROABS 3.6 03/15/2017   HGB 14.9 03/15/2017   HCT 43.3 03/15/2017   MCV 90.1 03/15/2017   PLT 202 03/15/2017      Chemistry  Component Value Date/Time   NA 142 03/15/2017 0927   K 4.1 03/15/2017 0927   CL 104 06/29/2014 0655   CO2 25 03/15/2017 0927   BUN 17.1 03/15/2017 0927   CREATININE 1.0 03/15/2017 0927      Component Value Date/Time   CALCIUM 9.7 03/15/2017 0927   ALKPHOS 49 03/15/2017 0927   AST 24 03/15/2017 0927   ALT 22 03/15/2017 0927   BILITOT 0.42 03/15/2017 0927      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[place]) The patient has excellent response to treatment. He tolerated treatment well with no side effects CT scan of the neck and chest were reviewed with the patient which showed no evidence of active disease. I recommend we transitioned him to once a month treatment indefinitely. I plan to continue surveillance imaging study every 6 months, next due in November 2018.   Metastasis to lung He is not symptomatic. Observe only CT chest  showed no evidence of disease progression I plan to repeat CT chest again in 6 months, next due November 2018  Acquired hypothyroidism He complained of recent fatigue. TSH is abnormal.  I will adjust his medications He will continue 100 MCG of levothyroxine for now  Neck pain on right side The patient has significant fibrosis and scar tissue from prior surgery and radiation. I recommend continue conservative management with physical therapy, topical lidocaine and pain medicine as needed   No orders of the defined types were placed in this encounter.  All questions were answered. The patient knows to call the clinic with any problems, questions or concerns. No barriers to learning was detected. I spent 15 minutes counseling the patient face to face. The total time spent in the appointment was 20 minutes and more than 50% was on  counseling and review of test results     [name], MD 03/15/2017 12:32 PM

## 2017-03-15 NOTE — Telephone Encounter (Signed)
Scheduled appt per 6/11 - Patient called to treatment area before appts scheduled. Patient to get new schedule in the treatment area.

## 2017-03-29 ENCOUNTER — Telehealth: Payer: Self-pay

## 2017-03-29 NOTE — Telephone Encounter (Signed)
Called with below message. Instructed to call for questions.

## 2017-03-29 NOTE — Telephone Encounter (Signed)
Pt went to health department. 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 He was started on medication that will affect his thyroxine levels. He was given a choice if he wanted to take these and he wanted to.  priftin 150 mg 6 tabs Vitamin b 6 50 mg  Isoniazid 300 mg 3 tabs  In the AM every Wed. For 3 months.   He is asking for Dr Alvy Bimler opinion about this.

## 2017-03-29 NOTE — Telephone Encounter (Signed)
Proceed to take those medications as instructed I'm watching his thyroid function with blood work, no problem

## 2017-04-12 ENCOUNTER — Ambulatory Visit: Payer: Medicaid Other

## 2017-04-12 ENCOUNTER — Ambulatory Visit: Payer: Medicaid Other | Admitting: Hematology and Oncology

## 2017-04-12 ENCOUNTER — Other Ambulatory Visit: Payer: Self-pay | Admitting: Hematology and Oncology

## 2017-04-12 ENCOUNTER — Other Ambulatory Visit (HOSPITAL_BASED_OUTPATIENT_CLINIC_OR_DEPARTMENT_OTHER): Payer: Medicaid Other

## 2017-04-12 ENCOUNTER — Other Ambulatory Visit: Payer: Medicaid Other

## 2017-04-12 ENCOUNTER — Ambulatory Visit (HOSPITAL_BASED_OUTPATIENT_CLINIC_OR_DEPARTMENT_OTHER): Payer: Medicaid Other | Admitting: Hematology and Oncology

## 2017-04-12 ENCOUNTER — Ambulatory Visit (HOSPITAL_BASED_OUTPATIENT_CLINIC_OR_DEPARTMENT_OTHER): Payer: Medicaid Other

## 2017-04-12 VITALS — BP 127/72 | HR 77 | Temp 98.8°F | Resp 16

## 2017-04-12 DIAGNOSIS — Z79899 Other long term (current) drug therapy: Secondary | ICD-10-CM | POA: Diagnosis not present

## 2017-04-12 DIAGNOSIS — C760 Malignant neoplasm of head, face and neck: Secondary | ICD-10-CM | POA: Diagnosis present

## 2017-04-12 DIAGNOSIS — C7800 Secondary malignant neoplasm of unspecified lung: Secondary | ICD-10-CM | POA: Diagnosis not present

## 2017-04-12 DIAGNOSIS — Z9119 Patient's noncompliance with other medical treatment and regimen: Secondary | ICD-10-CM

## 2017-04-12 DIAGNOSIS — M542 Cervicalgia: Secondary | ICD-10-CM | POA: Diagnosis not present

## 2017-04-12 DIAGNOSIS — Z5112 Encounter for antineoplastic immunotherapy: Secondary | ICD-10-CM | POA: Diagnosis not present

## 2017-04-12 DIAGNOSIS — Z91199 Patient's noncompliance with other medical treatment and regimen due to unspecified reason: Secondary | ICD-10-CM

## 2017-04-12 DIAGNOSIS — E039 Hypothyroidism, unspecified: Secondary | ICD-10-CM

## 2017-04-12 LAB — CBC WITH DIFFERENTIAL/PLATELET
BASO%: 0.2 % (ref 0.0–2.0)
Basophils Absolute: 0 10*3/uL (ref 0.0–0.1)
EOS%: 2.3 % (ref 0.0–7.0)
Eosinophils Absolute: 0.1 10*3/uL (ref 0.0–0.5)
HCT: 43.5 % (ref 38.4–49.9)
HGB: 14.9 g/dL (ref 13.0–17.1)
LYMPH%: 28.6 % (ref 14.0–49.0)
MCH: 30.7 pg (ref 27.2–33.4)
MCHC: 34.3 g/dL (ref 32.0–36.0)
MCV: 89.7 fL (ref 79.3–98.0)
MONO#: 0.5 10*3/uL (ref 0.1–0.9)
MONO%: 10.2 % (ref 0.0–14.0)
NEUT%: 58.7 % (ref 39.0–75.0)
NEUTROS ABS: 2.8 10*3/uL (ref 1.5–6.5)
NRBC: 0 % (ref 0–0)
Platelets: 194 10*3/uL (ref 140–400)
RBC: 4.85 10*6/uL (ref 4.20–5.82)
RDW: 13.5 % (ref 11.0–14.6)
WBC: 4.7 10*3/uL (ref 4.0–10.3)
lymph#: 1.4 10*3/uL (ref 0.9–3.3)

## 2017-04-12 LAB — COMPREHENSIVE METABOLIC PANEL
ALBUMIN: 4.3 g/dL (ref 3.5–5.0)
ALT: 20 U/L (ref 0–55)
ANION GAP: 6 meq/L (ref 3–11)
AST: 30 U/L (ref 5–34)
Alkaline Phosphatase: 55 U/L (ref 40–150)
BILIRUBIN TOTAL: 0.39 mg/dL (ref 0.20–1.20)
BUN: 14.7 mg/dL (ref 7.0–26.0)
CALCIUM: 9.9 mg/dL (ref 8.4–10.4)
CO2: 27 mEq/L (ref 22–29)
CREATININE: 0.9 mg/dL (ref 0.7–1.3)
Chloride: 105 mEq/L (ref 98–109)
EGFR: >90 mL/min/{1.73_m2} (ref >90)
Glucose: 98 mg/dl (ref 70–140)
Potassium: 4.1 mEq/L (ref 3.5–5.1)
Sodium: 138 mEq/L (ref 136–145)
TOTAL PROTEIN: 7.4 g/dL (ref 6.4–8.3)

## 2017-04-12 MED ORDER — SODIUM CHLORIDE 0.9 % IJ SOLN
10.0000 mL | INTRAMUSCULAR | Status: DC | PRN
  Administered 2017-04-12: 10 mL
  Filled 2017-04-12: qty 10

## 2017-04-12 MED ORDER — ANTICOAGULANT SODIUM CITRATE 4% (200MG/5ML) IV SOLN
5.0000 mL | Freq: Once | Status: AC
  Administered 2017-04-12: 5 mL via INTRAVENOUS
  Filled 2017-04-12: qty 5

## 2017-04-12 MED ORDER — SODIUM CHLORIDE 0.9 % IV SOLN
Freq: Once | INTRAVENOUS | Status: AC
Start: 2017-04-12 — End: 2017-04-12
  Administered 2017-04-12: 16:00:00 via INTRAVENOUS

## 2017-04-12 MED ORDER — NIVOLUMAB CHEMO INJECTION 100 MG/10ML
240.0000 mg | Freq: Once | INTRAVENOUS | Status: AC
  Administered 2017-04-12: 240 mg via INTRAVENOUS
  Filled 2017-04-12: qty 24

## 2017-04-12 NOTE — Patient Instructions (Addendum)
[facility] Discharge Instructions for Patients Receiving Chemotherapy  Today you received the following chemotherapy agents nivolumab (Opdivo)  To help prevent nausea and vomiting after your treatment, we encourage you to take your nausea medication as directed  If you develop nausea and vomiting that is not controlled by your nausea medication, call the clinic.   BELOW ARE SYMPTOMS THAT SHOULD BE REPORTED IMMEDIATELY:  *FEVER GREATER THAN 100.5 F  *CHILLS WITH OR WITHOUT FEVER  NAUSEA AND VOMITING THAT IS NOT CONTROLLED WITH YOUR NAUSEA MEDICATION  *UNUSUAL SHORTNESS OF BREATH  *UNUSUAL BRUISING OR BLEEDING  TENDERNESS IN MOUTH AND THROAT WITH OR WITHOUT PRESENCE OF ULCERS  *URINARY PROBLEMS  *BOWEL PROBLEMS  UNUSUAL RASH Items with * indicate a potential emergency and should be followed up as soon as possible.  Feel free to call the clinic you have any questions or concerns. The clinic phone number is [phone].

## 2017-04-12 NOTE — Progress Notes (Signed)
Okay to proceed with opdivo infusion without lab results per Dr. Alvy Bimler.  Pharmacy aware

## 2017-04-13 ENCOUNTER — Encounter: Payer: Self-pay | Admitting: Hematology and Oncology

## 2017-04-13 DIAGNOSIS — Z91199 Patient's noncompliance with other medical treatment and regimen due to unspecified reason: Secondary | ICD-10-CM | POA: Insufficient documentation

## 2017-04-13 DIAGNOSIS — Z9119 Patient's noncompliance with other medical treatment and regimen: Secondary | ICD-10-CM | POA: Insufficient documentation

## 2017-04-13 LAB — TSH: TSH: 1.746 m[IU]/L (ref 0.320–4.118)

## 2017-04-13 NOTE — Assessment & Plan Note (Signed)
The patient has excellent response to treatment. He tolerated treatment well with no side effects CT scan of the neck and chest were reviewed with the patient which showed no evidence of active disease. I recommend we transitioned him to once a month treatment indefinitely. I plan to continue surveillance imaging study every 6 months, next due in November 2018.

## 2017-04-13 NOTE — Assessment & Plan Note (Signed)
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 I recommend continue conservative management with physical therapy, topical lidocaine and pain medicine as needed

## 2017-04-13 NOTE — Assessment & Plan Note (Signed)
The patient had been late several times and nearly missed his appointment today We discussed the importance of keeping his appointment as scheduled and reinforced compliance

## 2017-04-13 NOTE — Assessment & Plan Note (Signed)
His symptoms of intermittent chest wall pain is likely related to prior surgery.  The patient is reassured CT chest showed no evidence of disease progression I plan to repeat CT chest again in 6 months, next due November 2018

## 2017-04-13 NOTE — Progress Notes (Signed)
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 Pan-endoscopy elsewhere showed cancer from nasopharyngeal space.      01/04/2007 - 02/20/2007 Chemotherapy    He received 2 cycles of cisplatin and 5FU followed by concurrent chemo with weekly cisplatin and radiation. He only received 2 doses of chemo due to severe mucositis, nausea and weight loss.      04/05/2007 - 08/04/2007 Chemotherapy    He received 4 more courses of cisplatin with 5FU and had complete response      07/05/2009 Procedure    Fine-needle aspirate of the right level II lymph nodes come from recurrent metastatic disease. Repeat endoscopy and CT scan show no evidence of disease elsewhere.      07/08/2009 - 12/02/2009 Chemotherapy    He was given 6 cycles of carboplatin, 5-FU and docetaxel      12/03/2009 Surgery    He has surgery to the residual lymph node on the right neck which showed no evidence of disease.      02/22/2012 Imaging    Repeat imaging study showed large recurrent mass. He was referred elsewhere for further treatment.       05/03/2012 Surgery    He underwent left upper lobectomy.      04/29/2013 Imaging    PEt scan showed lesion on right level II B and lower lung was abnormal      06/03/2013 - 02/02/2014 Chemotherapy    He had 6 cycles of chemotherapy when he was found to have recurrence of cancer and had received oxaliplatin and capecitabine      06/07/2014 Imaging    PET CT scan showed persistent disease in the right neck lymph nodes and left lung      06/29/2014 Procedure    Accession: [id number] repeat LUL biopsy confirmed metastatic cancer      07/18/2014 - 07/31/2014 Radiation Therapy    He received palliative radiation therapy to the lungs      10/10/2014 Imaging    CT scan of the chest, abdomen and pelvis show regression in the size of the lung nodule in the left upper lobe and stable pulmonary nodules      01/24/2015 Imaging    CT scan showed stable disease in neck and lung      06/19/2015 Imaging    CT scan of the neck and the chest show possible mild progression of the nodule in the right side of the neck.      06/25/2015 Imaging    PET scan confirmed disease recurrence in the neck      07/07/2015 Imaging    He had  MRI neck at [facility]      09/03/2015 -  Chemotherapy    He received palliative chemo with Nivolumab      10/29/2015 Imaging    PET CT showed positive response to Rx      02/28/2016 Imaging    Ct abdomen showed abnormal thinkening in his stomach      03/03/2016 Imaging    CT: Right sternocleidomastoid muscle metastasis appears less distinct but otherwise not significantly changed in size or configuration since 06/19/2015.2. Left level 3 lymph node which was hypermetabolic by PET-CT in January 2017 appears slightly smaller      04/01/2016 Imaging    CT cervical spine showed no acute fracture or traumatic malalignment in the cervical spine      04/22/2016 Procedure    Port-a-cath placed.      06/16/2016 Imaging    Ct neck showed right sternocleidomastoid muscle metastasis  is further decreased in conspicuity since May, and has mildly decreased in size since September 2016. Continued stability of sub-centimeter left cervical lymph nodes. No new or progressive metastatic disease in the neck.      06/16/2016 Imaging    CT chest showed stable masslike radiation fibrosis in the left upper lobe. Stable subcentimeter pulmonary nodules in the bilateral lower lobes. No new or progressive metastatic disease in the chest. Nonobstructing left renal stone.      10/13/2016 Imaging    Ct neck showed unchanged right sternocleidomastoid muscle metastasis. Unchanged subcentimeter left cervical lymph nodes. No evidence of new or progressive metastatic disease in the neck.      10/13/2016 Imaging    CT chest showed tiny hypervascular foci in the liver, not definitely seen on prior imaging of 06/16/2016 and 02/28/2016. Abdomen MRI without and with contrast recommended to further evaluate as metastatic disease is a concern. 2. Stable appearance of post treatment changes left upper lung and scattered tiny bilateral pulmonary nodules.      02/11/2017 Imaging    Ct neck: Lymph node mass right posterior neck appears improved from the prior study. Small posterior lymph nodes on the left unchanged. Occluded right jugular vein unchanged.      02/11/2017 Imaging    1. Similar appearance of postsurgical and radiation changes in the left upper lobe. 2. Similar bilateral pulmonary nodules. 3. No thoracic adenopathy. 4. Subtle foci of post-contrast enhancement within the liver are suboptimally characterized on this nondedicated study. Likely similar. These could either be re-evaluated at followup or more entirely characterized with abdominal MRI. 5. Left nephrolithiasis.       INTERVAL HISTORY: Please see below for problem oriented charting. The patient was very late to his appointment today because of misinterpretation of his schedule He had intermittent right neck pain and chest wall pain,  chronic and stable No new lymphadenopathy Denies recent cough  REVIEW OF SYSTEMS:   Constitutional: Denies fevers, chills or abnormal weight loss Eyes: Denies blurriness of vision Ears, nose, mouth, throat, and face: Denies mucositis or sore throat Respiratory: Denies cough, dyspnea or wheezes Cardiovascular: Denies palpitation, chest discomfort or lower extremity swelling Gastrointestinal:  Denies nausea, heartburn or change in bowel habits Skin: Denies abnormal skin rashes Lymphatics: Denies new lymphadenopathy or easy bruising Neurological:Denies numbness, tingling or new weaknesses Behavioral/Psych: Mood is stable, no new changes  All other systems were reviewed with the patient and are negative.  I have reviewed the past medical history, past surgical history, social history and family history with the patient and they are unchanged from previous  note.  ALLERGIES:  is allergic to heparin.  MEDICATIONS:  Current Outpatient Prescriptions  Medication Sig Dispense Refill  . diclofenac (VOLTAREN) 50 MG EC tablet Take 1 tablet (50 mg total) by mouth 3 (three) times daily. 60 tablet 1  . levothyroxine (SYNTHROID, LEVOTHROID) 100 MCG tablet Take 1 tablet (100 mcg total) by mouth daily before breakfast. 90 tablet 9  . lidocaine-prilocaine (EMLA) cream Apply 1 application topically as needed. 30 g 3  . meclizine (ANTIVERT) 25 MG tablet Take 1 tablet (25 mg total) by mouth 3 (three) times daily as needed for dizziness. 30 tablet 0  . ondansetron (ZOFRAN) 8 MG tablet Take 1 tablet (8 mg total) by mouth every 8 (eight) hours as needed for nausea or vomiting. (Patient not taking: Reported on 03/15/2017) 90 tablet 1  . oxyCODONE (ROXICODONE) 15 MG immediate release tablet Take 1 tablet (15 mg total) by mouth every 4 (four) hours as needed for pain. 60 tablet 0   No current facility-administered medications for this visit.    Facility-Administered Medications Ordered in Other Visits  Medication  Dose Route Frequency Provider Last Rate Last Dose  . sodium chloride 0.9 % injection 10 mL  10 mL Intracatheter PRN Alvy Bimler, Yumi Insalaco, MD        PHYSICAL EXAMINATION: ECOG PERFORMANCE STATUS: 1 - Symptomatic but completely ambulatory  GENERAL:alert, no distress and comfortable SKIN: skin color, texture, turgor are normal, no rashes or significant lesions EYES: normal, Conjunctiva are pink and non-injected, sclera clear OROPHARYNX:no exudate, no erythema and lips, buccal mucosa, and tongue normal  NECK: supple, thyroid normal size, non-tender, without nodularity LYMPH:  no palpable lymphadenopathy in the cervical, axillary or inguinal LUNGS: clear to auscultation and percussion with normal breathing effort HEART: regular rate & rhythm and no murmurs and no lower extremity edema ABDOMEN:abdomen soft, non-tender and normal bowel sounds Musculoskeletal:no cyanosis of digits and no clubbing  NEURO: alert & oriented x 3 with fluent speech, no focal motor/sensory deficits  LABORATORY DATA:  I have reviewed the data as listed    Component Value Date/Time   NA 138 04/12/2017 1525   K 4.1 04/12/2017 1525   CL 104 06/29/2014 0655   CO2 27 04/12/2017 1525   GLUCOSE 98 04/12/2017 1525   BUN 14.7 04/12/2017 1525   CREATININE 0.9 04/12/2017 1525   CALCIUM 9.9 04/12/2017 1525   PROT 7.4 04/12/2017 1525   ALBUMIN 4.3 04/12/2017 1525   AST 30 04/12/2017 1525   ALT 20 04/12/2017 1525   ALKPHOS 55 04/12/2017 1525   BILITOT 0.39 04/12/2017 1525   GFRNONAA >90 06/29/2014 0655   GFRAA >90 06/29/2014 0655    No results found for: SPEP, UPEP  Lab Results  Component Value Date   WBC 4.7 04/12/2017   NEUTROABS 2.8 04/12/2017   HGB 14.9 04/12/2017   HCT 43.5 04/12/2017   MCV 89.7 04/12/2017   PLT 194 04/12/2017      Chemistry      Component Value Date/Time   NA 138 04/12/2017 1525   K 4.1 04/12/2017 1525   CL 104 06/29/2014 0655   CO2 27 04/12/2017 1525   BUN 14.7 04/12/2017 1525    CREATININE 0.9 04/12/2017 1525      Component Value Date/Time   CALCIUM 9.9 04/12/2017 1525   ALKPHOS 55 04/12/2017 1525   AST 30 04/12/2017 1525   ALT 20 04/12/2017 1525   BILITOT 0.39 04/12/2017 1525      ASSESSMENT & PLAN:  Primary cancer of head  and neck [facility]) The patient has excellent response to treatment. He tolerated treatment well with no side effects CT scan of the neck and chest were reviewed with the patient which showed no evidence of active disease. I recommend we transitioned him to once a month treatment indefinitely. I plan to continue surveillance imaging study every 6 months, next due in November 2018.   Metastasis to lung His symptoms of intermittent chest wall pain is likely related to prior surgery.  The patient is reassured CT chest showed no evidence of disease progression I plan to repeat CT chest again in 6 months, next due November 2018  Neck pain on right side The patient has significant fibrosis and scar tissue from prior surgery and radiation. I recommend continue conservative management with physical therapy, topical lidocaine and pain medicine as needed  Medically noncompliant The patient had been late several times and nearly missed his appointment today We discussed the importance of keeping his appointment as scheduled and reinforced compliance   No orders of the defined types were placed in this encounter.  All questions were answered. The patient knows to call the clinic with any problems, questions or concerns. No barriers to learning was detected. I spent 15 minutes counseling the patient face to face. The total time spent in the appointment was 20 minutes and more than 50% was on counseling and review of test results     [name], MD 04/13/2017 7:30 AM

## 2017-04-15 ENCOUNTER — Other Ambulatory Visit: Payer: Self-pay | Admitting: Hematology and Oncology

## 2017-04-16 ENCOUNTER — Telehealth: Payer: Self-pay | Admitting: Hematology and Oncology

## 2017-04-16 NOTE — Telephone Encounter (Signed)
Scheduled appt per sch message from Dr. Alvy Bimler - patient is aware of appt date and time.

## 2017-05-10 ENCOUNTER — Ambulatory Visit (HOSPITAL_BASED_OUTPATIENT_CLINIC_OR_DEPARTMENT_OTHER): Payer: Medicaid Other

## 2017-05-10 ENCOUNTER — Encounter: Payer: Self-pay | Admitting: Hematology and Oncology

## 2017-05-10 ENCOUNTER — Telehealth: Payer: Self-pay | Admitting: Hematology and Oncology

## 2017-05-10 ENCOUNTER — Ambulatory Visit (HOSPITAL_BASED_OUTPATIENT_CLINIC_OR_DEPARTMENT_OTHER): Payer: Medicaid Other | Admitting: Hematology and Oncology

## 2017-05-10 ENCOUNTER — Ambulatory Visit: Payer: Medicaid Other

## 2017-05-10 ENCOUNTER — Other Ambulatory Visit (HOSPITAL_BASED_OUTPATIENT_CLINIC_OR_DEPARTMENT_OTHER): Payer: Medicaid Other

## 2017-05-10 VITALS — BP 123/70 | HR 70 | Temp 98.5°F | Resp 18 | Ht 68.75 in | Wt 171.8 lb

## 2017-05-10 DIAGNOSIS — C760 Malignant neoplasm of head, face and neck: Secondary | ICD-10-CM

## 2017-05-10 DIAGNOSIS — C7800 Secondary malignant neoplasm of unspecified lung: Secondary | ICD-10-CM

## 2017-05-10 DIAGNOSIS — C7801 Secondary malignant neoplasm of right lung: Secondary | ICD-10-CM

## 2017-05-10 DIAGNOSIS — I878 Other specified disorders of veins: Secondary | ICD-10-CM

## 2017-05-10 DIAGNOSIS — E039 Hypothyroidism, unspecified: Secondary | ICD-10-CM

## 2017-05-10 DIAGNOSIS — Z5112 Encounter for antineoplastic immunotherapy: Secondary | ICD-10-CM | POA: Diagnosis not present

## 2017-05-10 DIAGNOSIS — M542 Cervicalgia: Secondary | ICD-10-CM | POA: Diagnosis not present

## 2017-05-10 DIAGNOSIS — R221 Localized swelling, mass and lump, neck: Secondary | ICD-10-CM

## 2017-05-10 DIAGNOSIS — C7989 Secondary malignant neoplasm of other specified sites: Secondary | ICD-10-CM

## 2017-05-10 LAB — CBC WITH DIFFERENTIAL/PLATELET
BASO%: 0.3 % (ref 0.0–2.0)
Basophils Absolute: 0 10*3/uL (ref 0.0–0.1)
EOS%: 2 % (ref 0.0–7.0)
Eosinophils Absolute: 0.1 10*3/uL (ref 0.0–0.5)
HCT: 45.3 % (ref 38.4–49.9)
HEMOGLOBIN: 15.4 g/dL (ref 13.0–17.1)
LYMPH#: 1.2 10*3/uL (ref 0.9–3.3)
LYMPH%: 20.4 % (ref 14.0–49.0)
MCH: 30.7 pg (ref 27.2–33.4)
MCHC: 34 g/dL (ref 32.0–36.0)
MCV: 90.4 fL (ref 79.3–98.0)
MONO#: 0.6 10*3/uL (ref 0.1–0.9)
MONO%: 10 % (ref 0.0–14.0)
NEUT%: 67.3 % (ref 39.0–75.0)
NEUTROS ABS: 4 10*3/uL (ref 1.5–6.5)
Platelets: 177 10*3/uL (ref 140–400)
RBC: 5.01 10*6/uL (ref 4.20–5.82)
RDW: 13.5 % (ref 11.0–14.6)
WBC: 5.9 10*3/uL (ref 4.0–10.3)

## 2017-05-10 LAB — COMPREHENSIVE METABOLIC PANEL
ALT: 20 U/L (ref 0–55)
ANION GAP: 9 meq/L (ref 3–11)
AST: 23 U/L (ref 5–34)
Albumin: 4.4 g/dL (ref 3.5–5.0)
Alkaline Phosphatase: 55 U/L (ref 40–150)
BILIRUBIN TOTAL: 0.57 mg/dL (ref 0.20–1.20)
BUN: 13.9 mg/dL (ref 7.0–26.0)
CALCIUM: 10 mg/dL (ref 8.4–10.4)
CO2: 28 mEq/L (ref 22–29)
CREATININE: 0.9 mg/dL (ref 0.7–1.3)
Chloride: 104 mEq/L (ref 98–109)
Glucose: 126 mg/dl (ref 70–140)
Potassium: 4 mEq/L (ref 3.5–5.1)
Sodium: 140 mEq/L (ref 136–145)
TOTAL PROTEIN: 7.3 g/dL (ref 6.4–8.3)

## 2017-05-10 LAB — TSH: TSH: 1.551 m[IU]/L (ref 0.320–4.118)

## 2017-05-10 MED ORDER — OXYCODONE HCL 15 MG PO TABS: 15.0000 mg | ORAL_TABLET | ORAL | 0 refills | Status: DC | PRN

## 2017-05-10 MED ORDER — SODIUM CHLORIDE 0.9 % IJ SOLN
10.0000 mL | INTRAMUSCULAR | Status: DC | PRN
  Administered 2017-05-10: 10 mL
  Filled 2017-05-10: qty 10

## 2017-05-10 MED ORDER — SODIUM CHLORIDE 0.9 % IV SOLN
240.0000 mg | Freq: Once | INTRAVENOUS | Status: AC
  Administered 2017-05-10: 240 mg via INTRAVENOUS
  Filled 2017-05-10: qty 24

## 2017-05-10 MED ORDER — SODIUM CHLORIDE 0.9 % IV SOLN
Freq: Once | INTRAVENOUS | Status: AC
  Administered 2017-05-10: 13:00:00 via INTRAVENOUS

## 2017-05-10 MED ORDER — CYCLOBENZAPRINE HCL 5 MG PO TABS: 5.0000 mg | ORAL_TABLET | Freq: Three times a day (TID) | ORAL | 0 refills | Status: DC | PRN

## 2017-05-10 MED ORDER — ANTICOAGULANT SODIUM CITRATE 4% (200MG/5ML) IV SOLN
5.0000 mL | Freq: Once | Status: AC
  Administered 2017-05-10: 5 mL via INTRAVENOUS
  Filled 2017-05-10: qty 5

## 2017-05-10 MED ORDER — SODIUM CHLORIDE 0.9 % IJ SOLN
10.0000 mL | INTRAMUSCULAR | Status: AC | PRN
  Administered 2017-05-10: 10 mL
  Filled 2017-05-10: qty 10

## 2017-05-10 NOTE — Patient Instructions (Signed)
[facility] Discharge Instructions for Patients Receiving Chemotherapy  Today you received the following chemotherapy agents:  Nivolumab.  To help prevent nausea and vomiting after your treatment, we encourage you to take your nausea medication as directed.   If you develop nausea and vomiting that is not controlled by your nausea medication, call the clinic.   BELOW ARE SYMPTOMS THAT SHOULD BE REPORTED IMMEDIATELY:  *FEVER GREATER THAN 100.5 F  *CHILLS WITH OR WITHOUT FEVER  NAUSEA AND VOMITING THAT IS NOT CONTROLLED WITH YOUR NAUSEA MEDICATION  *UNUSUAL SHORTNESS OF BREATH  *UNUSUAL BRUISING OR BLEEDING  TENDERNESS IN MOUTH AND THROAT WITH OR WITHOUT PRESENCE OF ULCERS  *URINARY PROBLEMS  *BOWEL PROBLEMS  UNUSUAL RASH Items with * indicate a potential emergency and should be followed up as soon as possible.  Feel free to call the clinic you have any questions or concerns. The clinic phone number is [phone].  Please show the CHEMO ALERT CARD at check-in to the Emergency Department and triage nurse.   

## 2017-05-10 NOTE — Assessment & Plan Note (Signed)
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 I recommend continue conservative management with physical therapy, topical lidocaine, muscle relaxant and pain medicine as needed I will proceed with treatment of plan to order CT scan of the neck for further assessment

## 2017-05-10 NOTE — Telephone Encounter (Signed)
Scheduled appt per 8/6 los - Gave patient AVS and calender per los.

## 2017-05-10 NOTE — Assessment & Plan Note (Signed)
The patient has raise concern about worsening right neck pain Clinically, his neck is thickened & woody from prior surgery and fibrosis from radiation Just to be sure, I plan to repeat CT scan for further assessment In the meantime, we will pursue retreatment with dose adjustment

## 2017-05-10 NOTE — Assessment & Plan Note (Signed)
He will continue 100 MCG of levothyroxine for now Repeat TSH is stable

## 2017-05-10 NOTE — Progress Notes (Signed)
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 Pan-endoscopy elsewhere showed cancer from nasopharyngeal space.      01/04/2007 - 02/20/2007 Chemotherapy    He received 2 cycles of cisplatin and 5FU followed by concurrent chemo with weekly cisplatin and radiation. He only received 2 doses of chemo due to severe mucositis, nausea and weight loss.      04/05/2007 - 08/04/2007 Chemotherapy    He received 4 more courses of cisplatin with 5FU and had complete response      07/05/2009 Procedure    Fine-needle aspirate of the right level II lymph nodes come from recurrent metastatic disease. Repeat endoscopy and CT scan show no evidence of disease elsewhere.      07/08/2009 - 12/02/2009 Chemotherapy    He was given 6 cycles of carboplatin, 5-FU and docetaxel      12/03/2009 Surgery    He has surgery to the residual lymph node on the right neck which showed no evidence of disease.      02/22/2012 Imaging    Repeat imaging study showed large recurrent mass. He was referred elsewhere for further treatment.      05/03/2012 Surgery    He underwent left upper lobectomy.      04/29/2013 Imaging    PEt scan showed lesion on right level II B and lower lung was abnormal      06/03/2013 - 02/02/2014 Chemotherapy    He had 6 cycles of chemotherapy when he was found to have recurrence of cancer and had received oxaliplatin and capecitabine      06/07/2014 Imaging    PET CT scan showed persistent disease in the right neck lymph nodes and left lung      06/29/2014 Procedure    Accession: [id number] repeat LUL biopsy confirmed metastatic cancer      07/18/2014 - 07/31/2014 Radiation Therapy    He received palliative radiation therapy to the lungs      10/10/2014 Imaging    CT scan of the chest, abdomen and pelvis show regression in the size of the lung nodule in the left upper lobe and stable pulmonary nodules      01/24/2015 Imaging    CT scan showed stable disease in neck and lung      06/19/2015 Imaging    CT scan of the neck and the chest show possible mild progression of the nodule in the right side of the neck.      06/25/2015 Imaging    PET scan confirmed disease recurrence in the neck      07/07/2015 Imaging    He had MRI  [facility] [address]      09/03/2015 -  Chemotherapy    He received palliative chemo with Nivolumab      10/29/2015 Imaging    PET CT showed positive response to Rx      02/28/2016 Imaging    Ct abdomen showed abnormal thinkening in his stomach      03/03/2016 Imaging    CT: Right sternocleidomastoid muscle metastasis appears less distinct but otherwise not significantly changed in size or configuration since 06/19/2015.2. Left level 3 lymph node which was hypermetabolic by PET-CT in January 2017 appears slightly smaller      04/01/2016 Imaging    CT cervical spine showed no acute fracture or traumatic malalignment in the cervical spine      04/22/2016 Procedure    Port-a-cath placed.      06/16/2016 Imaging    Ct neck showed right sternocleidomastoid muscle metastasis  is further decreased in conspicuity since May, and has mildly decreased in size since September 2016. Continued stability of sub-centimeter left cervical lymph nodes. No new or progressive metastatic disease in the neck.      06/16/2016 Imaging    CT chest showed stable masslike radiation fibrosis in the left upper lobe. Stable subcentimeter pulmonary nodules in the bilateral lower lobes. No new or progressive metastatic disease in the chest. Nonobstructing left renal stone.      10/13/2016 Imaging    Ct neck showed unchanged right sternocleidomastoid muscle metastasis. Unchanged subcentimeter left cervical lymph nodes. No evidence of new or progressive metastatic disease in the neck.      10/13/2016 Imaging    CT chest showed tiny hypervascular foci in the liver, not definitely seen on prior imaging of 06/16/2016 and 02/28/2016. Abdomen MRI without and with contrast recommended to further evaluate as metastatic disease is a concern. 2. Stable appearance of post treatment changes left upper lung and scattered tiny bilateral pulmonary nodules.      02/11/2017 Imaging    Ct neck: Lymph node mass right posterior neck appears improved from the prior study. Small posterior lymph nodes on the left unchanged. Occluded right jugular vein unchanged.      02/11/2017 Imaging    1. Similar appearance of postsurgical and radiation changes in the left upper lobe. 2. Similar bilateral pulmonary nodules. 3. No thoracic adenopathy. 4. Subtle foci of post-contrast enhancement within the liver are suboptimally characterized on this nondedicated study. Likely similar. These could either be re-evaluated at followup or more entirely characterized with abdominal MRI. 5. Left nephrolithiasis.       INTERVAL HISTORY: Please see below for problem oriented charting. He returns for further follow-up He complained of worsening neck pain recently He started taking oxycodone but it does not help much He has some  limitation of mobility He has declined physical therapy He denies swallowing difficulties Denies recent chest pain or shortness of breath  REVIEW OF SYSTEMS:   Constitutional: Denies fevers, chills or abnormal weight loss Eyes: Denies blurriness of vision Ears, nose, mouth, throat, and face: Denies mucositis or sore throat Respiratory: Denies cough, dyspnea or wheezes Cardiovascular: Denies palpitation, chest discomfort or lower extremity swelling Gastrointestinal:  Denies nausea, heartburn or change in bowel habits Skin: Denies abnormal skin rashes Lymphatics: Denies new lymphadenopathy or easy bruising Neurological:Denies numbness, tingling or new weaknesses Behavioral/Psych: Mood is stable, no new changes  All other systems were reviewed with the patient and are negative.  I have reviewed the past medical history, past surgical history, social history and family  history with the patient and they are unchanged from previous note.  ALLERGIES:  is allergic to heparin.  MEDICATIONS:  Current Outpatient Prescriptions  Medication Sig Dispense Refill  . cyclobenzaprine (FLEXERIL) 5 MG tablet Take 1 tablet (5 mg total) by mouth 3 (three) times daily as needed for muscle spasms. 60 tablet 0  . diclofenac (VOLTAREN) 50 MG EC tablet Take 1 tablet (50 mg total) by mouth 3 (three) times daily. 60 tablet 1  . levothyroxine (SYNTHROID, LEVOTHROID) 100 MCG tablet Take 1 tablet (100 mcg total) by mouth daily before breakfast. 90 tablet 9  . lidocaine-prilocaine (EMLA) cream Apply 1 application topically as needed. 30 g 3  . meclizine (ANTIVERT) 25 MG tablet Take 1 tablet (25 mg total) by mouth 3 (three) times daily as needed for dizziness. 30 tablet 0  . ondansetron (ZOFRAN) 8 MG tablet Take 1 tablet (8 mg total) by mouth every 8 (eight) hours as needed for nausea or vomiting. (Patient not taking: Reported on 03/15/2017) 90 tablet 1  . oxyCODONE (ROXICODONE) 15 MG immediate release tablet Take 1  tablet (15 mg total) by mouth every 4 (four) hours as needed for pain. 60 tablet 0   No current facility-administered medications for this visit.    Facility-Administered Medications Ordered in Other Visits  Medication Dose Route Frequency Provider Last Rate Last Dose  . sodium chloride 0.9 % injection 10 mL  10 mL Intracatheter PRN Alvy Bimler, Lamarius Dirr, MD      . sodium chloride 0.9 % injection 10 mL  10 mL Intracatheter PRN Alvy Bimler, Wynter Grave, MD   10 mL at 05/10/17 1438    PHYSICAL EXAMINATION: ECOG PERFORMANCE STATUS: 1 - Symptomatic but completely ambulatory  Vitals:   05/10/17 1251  BP: 123/70  Pulse: 70  Resp: 18  Temp: 98.5 F (36.9 C)   Filed Weights   05/10/17 1251  Weight: 171 lb 12.8 oz (77.9 kg)    GENERAL:alert, no distress and comfortable SKIN: skin color, texture, turgor are normal, no rashes or significant lesions EYES: normal, Conjunctiva are pink and non-injected, sclera clear OROPHARYNX:no exudate, no erythema and lips, buccal mucosa, and tongue normal  NECK: Well-healed surgical scar. His neck is thick and woody from recent radiation changes LYMPH:  no palpable lymphadenopathy in the cervical, axillary or inguinal LUNGS: clear to auscultation and percussion with normal breathing effort HEART: regular rate & rhythm and no murmurs and no lower extremity edema ABDOMEN:abdomen soft, non-tender and normal bowel sounds Musculoskeletal:no cyanosis of digits and no clubbing  NEURO: alert & oriented x 3 with fluent speech, no focal motor/sensory deficits  LABORATORY DATA:  I have reviewed the data as listed    Component Value Date/Time   NA 140 05/10/2017 1212   K 4.0 05/10/2017 1212   CL 104 06/29/2014 0655   CO2 28 05/10/2017 1212   GLUCOSE 126 05/10/2017 1212   BUN 13.9 05/10/2017 1212   CREATININE 0.9 05/10/2017 1212   CALCIUM 10.0 05/10/2017 1212   PROT 7.3 05/10/2017 1212   ALBUMIN 4.4 05/10/2017 1212   AST 23 05/10/2017 1212   ALT 20 05/10/2017 1212   ALKPHOS  55 05/10/2017 1212   BILITOT 0.57 05/10/2017 1212   GFRNONAA >90 06/29/2014 0655   GFRAA >90 06/29/2014 0655    No results found for: SPEP, UPEP  Lab Results  Component Value Date   WBC 5.9 05/10/2017   NEUTROABS 4.0 05/10/2017   HGB 15.4 05/10/2017   HCT 45.3 05/10/2017   MCV 90.4 05/10/2017  PLT 177 05/10/2017      Chemistry      Component Value Date/Time   NA 140 05/10/2017 1212   K 4.0 05/10/2017 1212   CL 104 06/29/2014 0655   CO2 28 05/10/2017 1212   BUN 13.9 05/10/2017 1212   CREATININE 0.9 05/10/2017 1212      Component Value Date/Time   CALCIUM 10.0 05/10/2017 1212   ALKPHOS 55 05/10/2017 1212   AST 23 05/10/2017 1212   ALT 20 05/10/2017 1212   BILITOT 0.57 05/10/2017 1212      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[facility]) The patient has raise concern about worsening right neck pain Clinically, his neck is thickened & woody from prior surgery and fibrosis from radiation Just to be sure, I plan to repeat CT scan for further assessment In the meantime, we will pursue retreatment with dose adjustment  Acquired hypothyroidism He will continue 100 MCG of levothyroxine for now Repeat TSH is stable  Neck pain on right side The patient has significant fibrosis and scar tissue from prior surgery and radiation. I recommend continue conservative management with physical therapy, topical lidocaine, muscle relaxant and pain medicine as needed I will proceed with treatment of plan to order CT scan of the neck for further assessment   Orders Placed This Encounter  Procedures  . CT Soft Tissue Neck W Contrast    Standing Status:   Future    Standing Expiration Date:   05/10/2018    Order Specific Question:   If indicated for the ordered procedure, I authorize the administration of contrast media per Radiology protocol    Answer:   Yes    Order Specific Question:   Preferred imaging location?    Answer:   [facility]    Order Specific  Question:   Radiology Contrast Protocol - do NOT remove file path    Answer:   [email or web address]   All questions were answered. The patient knows to call the clinic with any problems, questions or concerns. No barriers to learning was detected. I spent 20 minutes counseling the patient face to face. The total time spent in the appointment was 25 minutes and more than 50% was on counseling and review of test results     [name], MD 05/10/2017 4:34 PM

## 2017-05-17 ENCOUNTER — Emergency Department (HOSPITAL_COMMUNITY): Payer: Medicaid Other

## 2017-05-17 ENCOUNTER — Encounter (HOSPITAL_COMMUNITY): Payer: Self-pay | Admitting: Emergency Medicine

## 2017-05-17 ENCOUNTER — Emergency Department (HOSPITAL_COMMUNITY)
Admission: EM | Admit: 2017-05-17 | Discharge: 2017-05-17 | Disposition: A | Discharge status: Home / Self Care | Payer: Medicaid Other | Attending: Emergency Medicine | Admitting: Emergency Medicine

## 2017-05-17 DIAGNOSIS — N200 Calculus of kidney: Secondary | ICD-10-CM | POA: Diagnosis not present

## 2017-05-17 DIAGNOSIS — R1032 Left lower quadrant pain: Secondary | ICD-10-CM | POA: Diagnosis present

## 2017-05-17 DIAGNOSIS — R109 Unspecified abdominal pain: Secondary | ICD-10-CM

## 2017-05-17 HISTORY — DX: Malignant (primary) neoplasm, unspecified: C80.1

## 2017-05-17 LAB — URINALYSIS, ROUTINE W REFLEX MICROSCOPIC
Bilirubin Urine: NEGATIVE
GLUCOSE, UA: NEGATIVE mg/dL
Ketones, ur: NEGATIVE mg/dL
Leukocytes, UA: NEGATIVE
NITRITE: NEGATIVE
PH: 9 — AB (ref 5.0–8.0)
Protein, ur: 30 mg/dL — AB
SPECIFIC GRAVITY, URINE: 1.017 (ref 1.005–1.030)
Squamous Epithelial / LPF: NONE SEEN

## 2017-05-17 MED ORDER — SODIUM CHLORIDE 0.9 % IV SOLN
INTRAVENOUS | Status: DC
  Administered 2017-05-17: 17:00:00 via INTRAVENOUS

## 2017-05-17 MED ORDER — KETOROLAC TROMETHAMINE 30 MG/ML IJ SOLN
30.0000 mg | Freq: Once | INTRAMUSCULAR | Status: AC
  Administered 2017-05-17: 30 mg via INTRAVENOUS
  Filled 2017-05-17: qty 1

## 2017-05-17 MED ORDER — HEPARIN SOD (PORK) LOCK FLUSH 100 UNIT/ML IV SOLN
500.0000 [IU] | Freq: Once | INTRAVENOUS | Status: DC
Start: 2017-05-17 — End: 2017-05-17
  Filled 2017-05-17: qty 5

## 2017-05-17 MED ORDER — ONDANSETRON HCL 4 MG/2ML IJ SOLN
4.0000 mg | Freq: Once | INTRAMUSCULAR | Status: AC
  Administered 2017-05-17: 4 mg via INTRAVENOUS
  Filled 2017-05-17: qty 2

## 2017-05-17 MED ORDER — HYDROMORPHONE HCL 1 MG/ML IJ SOLN
1.0000 mg | Freq: Once | INTRAMUSCULAR | Status: AC
  Administered 2017-05-17: 1 mg via INTRAVENOUS
  Filled 2017-05-17: qty 1

## 2017-05-17 NOTE — ED Notes (Signed)
Patient refused heparin lock flush due to religious beliefs. Pt states they were no longer doing the flush at the cancer center. This RN spoke with pharmacy, was told that they do not carry any lock flush anticoagulants that do not contain pork derivatives.

## 2017-05-17 NOTE — ED Triage Notes (Signed)
Pt here via EMS with c/o left sided flank pain that began at 1400. Pt states he has had same symptoms before, but a cause was never determined. Pt denies hx of renal disease. Pt states he has a hx of carcinoma, but denies chemo or radiation. Pt states he is "undergoing immune therapy". 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 Pt denies urinary symptoms.

## 2017-05-17 NOTE — ED Triage Notes (Signed)
Pt states that he has L sided flank pain. Hx of same w/o causation. Alert and oriented.

## 2017-05-17 NOTE — ED Provider Notes (Signed)
[facility] DEPT Provider Note   CSN: [id number] Arrival date & time: 05/17/17  1512     History   Chief Complaint Chief Complaint  Patient presents with  . Flank Pain    HPI Christian Simmons is a 31 y.o. male.  Patient c/o acute onset left flank pain posteriorly 2 hours ago. Pain acute onset, constant, dull, severe, non radiating. Same pain in past but unsure of cause. Denies injury or strain. No radicular pain. No numbness/weakness. Denies fever or chills. No dysuria or hematuria. No hx kidney stones.    The history is provided by the patient.  Flank Pain  Pertinent negatives include no chest pain, no abdominal pain, no headaches and no shortness of breath.    Past Medical History:  Diagnosis Date  . Carcinoma ([place])     There are no active problems to display for this patient.   History reviewed. No pertinent surgical history.     Home Medications    Prior to Admission medications   Not on File    Family History History reviewed. No pertinent family history.  Social History Social History  Substance Use Topics  . Smoking status: Never Smoker  . Smokeless tobacco: Never Used  . Alcohol use No     Allergies   Patient has no known allergies.   Review of Systems Review of Systems  Constitutional: Negative for fever.  HENT: Negative for sore throat.   Eyes: Negative for redness.  Respiratory: Negative for shortness of breath.   Cardiovascular: Negative for chest pain.  Gastrointestinal: Negative for abdominal pain.  Endocrine: Negative for polyuria.  Genitourinary: Positive for flank pain. Negative for dysuria and hematuria.  Musculoskeletal: Positive for back pain.  Skin: Negative for rash.  Neurological: Negative for headaches.  Hematological: Does not bruise/bleed easily.  Psychiatric/Behavioral: Negative for confusion.     Physical Exam Updated Vital Signs BP 135/79 (BP Location: Left Arm)   Pulse 79   Temp 98.1 F (36.7 C)  (Oral)   Resp 16   SpO2 99%   Physical Exam  Constitutional: He appears well-developed and well-nourished. No distress.  HENT:  Mouth/Throat: Oropharynx is clear and moist.  Eyes: Conjunctivae are normal.  Neck: Neck supple. No tracheal deviation present.  Cardiovascular: Normal rate.   Pulmonary/Chest: Effort normal. No accessory muscle usage. No respiratory distress.  Abdominal: Soft. Bowel sounds are normal. He exhibits no distension. There is no tenderness.  Genitourinary:  Genitourinary Comments: No cva tenderness. No scrotal or testicular pain or tenderness.   Musculoskeletal: He exhibits no edema.  Neurological: He is alert.  Skin: Skin is warm and dry. No rash noted. He is not diaphoretic.  Psychiatric: He has a normal mood and affect.  Nursing note and vitals reviewed.    ED Treatments / Results  Labs (all labs ordered are listed, but only abnormal results are displayed) Results for orders placed or performed during the hospital encounter of 05/17/17  Urinalysis, Routine w reflex microscopic- may I&O cath if menses  Result Value Ref Range   Color, Urine RED (A) YELLOW   APPearance CLOUDY (A) CLEAR   Specific Gravity, Urine 1.017 1.005 - 1.030   pH 9.0 (H) 5.0 - 8.0   Glucose, UA NEGATIVE NEGATIVE mg/dL   Hgb urine dipstick LARGE (A) NEGATIVE   Bilirubin Urine NEGATIVE NEGATIVE   Ketones, ur NEGATIVE NEGATIVE mg/dL   Protein, ur 30 (A) NEGATIVE mg/dL   Nitrite NEGATIVE NEGATIVE   Leukocytes, UA NEGATIVE NEGATIVE   RBC /  HPF TOO NUMEROUS TO COUNT 0 - 5 RBC/hpf   WBC, UA 0-5 0 - 5 WBC/hpf   Bacteria, UA FEW (A) NONE SEEN   Squamous Epithelial / LPF NONE SEEN NONE SEEN   Mucous PRESENT    Ct Renal Stone Study  Result Date: 05/17/2017 CLINICAL DATA:  LEFT flank pain since 2 p.m., assess for kidney stone. History of nasopharyngeal carcinoma 2007. EXAM: CT ABDOMEN AND PELVIS WITHOUT CONTRAST TECHNIQUE: Multidetector CT imaging of the abdomen and pelvis was performed  following the standard protocol without IV contrast. COMPARISON:  None. FINDINGS: LOWER CHEST: Lung bases are clear. The visualized heart size is normal. No pericardial effusion. HEPATOBILIARY: Normal. PANCREAS: Normal. SPLEEN: Normal. ADRENALS/URINARY TRACT: Kidneys are orthotopic, demonstrating normal size and morphology. Numerous punctate bilateral nephrolithiasis in addition to 3 mm LEFT lower pole nephrolithiasis. Mild LEFT hydroureteronephrosis the ureteral calcification. Limited assessment for renal masses on this nonenhanced examination. The unopacified ureters are normal in course and caliber. Urinary bladder is partially distended harboring no intravesicular calculi. Urachal remanent. Normal adrenal glands. STOMACH/BOWEL: The stomach, small and large bowel are normal in course and caliber without inflammatory changes, sensitivity decreased by lack of enteric contrast. Normal appendix. VASCULAR/LYMPHATIC: Aortoiliac vessels are normal in course and caliber. No lymphadenopathy by CT size criteria. REPRODUCTIVE: Normal. OTHER: No intraperitoneal free fluid or free air. MUSCULOSKELETAL: Non-acute. IMPRESSION: 1. Mild LEFT hydroureteronephrosis without ureteral calculus, seen with passed stone or, urinary tract infection. 2. Bilateral nephrolithiasis measuring to 3 mm on the LEFT. Electronically Signed   By: Elon Alas M.D.   On: 05/17/2017 17:33    EKG  EKG Interpretation None       Radiology No results found.  Procedures Procedures (including critical care time)  Medications Ordered in ED Medications  0.9 %  sodium chloride infusion (not administered)  HYDROmorphone (DILAUDID) injection 1 mg (not administered)  ketorolac (TORADOL) 30 MG/ML injection 30 mg (not administered)  ondansetron (ZOFRAN) injection 4 mg (not administered)     Initial Impression / Assessment and Plan / ED Course  I have reviewed the triage vital signs and the nursing notes.  Pertinent labs & imaging  results that were available during my care of the patient were reviewed by me and considered in my medical decision making (see chart for details).  Iv ns. Dilaudid 1 mg iv. zofran iv. toradol iv.  Reviewed nursing notes and prior charts for additional history.   Recheck pain resolved.  Pt eating and drinking.  Discussed ct with pt.   Recheck pain resolved.   Final Clinical Impressions(s) / ED Diagnoses   Final diagnoses:  None    New Prescriptions New Prescriptions   No medications on file     Lajean Saver, MD 05/21/17 1050

## 2017-05-17 NOTE — Discharge Instructions (Signed)
It was our pleasure to provide your ER care today - we hope that you feel better.  Your ct scan shows bilateral kidney stones, and likely a recently passed stone on the left.   Drink plenty of fluids. Strain urine.   Follow up with urologist in the next couple weeks.    Return to ER if worse, new symptoms, fevers, worsening or severe pain, other concern.   You were given pain medication in the ER - no driving for the next 6 hours.

## 2017-05-18 ENCOUNTER — Encounter: Payer: Self-pay | Admitting: Hematology and Oncology

## 2017-05-19 ENCOUNTER — Ambulatory Visit (HOSPITAL_COMMUNITY)
Admission: RE | Admit: 2017-05-19 | Discharge: 2017-05-19 | Disposition: A | Discharge status: Home / Self Care | Payer: Medicaid Other | Source: Ambulatory Visit | Attending: Hematology and Oncology | Admitting: Hematology and Oncology

## 2017-05-19 ENCOUNTER — Telehealth: Payer: Self-pay

## 2017-05-19 ENCOUNTER — Encounter (HOSPITAL_COMMUNITY): Payer: Self-pay

## 2017-05-19 DIAGNOSIS — R918 Other nonspecific abnormal finding of lung field: Secondary | ICD-10-CM | POA: Diagnosis not present

## 2017-05-19 DIAGNOSIS — R221 Localized swelling, mass and lump, neck: Secondary | ICD-10-CM | POA: Diagnosis not present

## 2017-05-19 MED ORDER — IOPAMIDOL (ISOVUE-300) INJECTION 61%
75.0000 mL | Freq: Once | INTRAVENOUS | Status: AC | PRN
  Administered 2017-05-19: 75 mL via INTRAVENOUS

## 2017-05-19 MED ORDER — IOPAMIDOL (ISOVUE-300) INJECTION 61%
INTRAVENOUS | Status: AC
  Administered 2017-05-19: 75 mL via INTRAVENOUS
  Filled 2017-05-19: qty 75

## 2017-05-19 NOTE — Telephone Encounter (Signed)
Patient called and left message to call him back. Called him back with below message. He is going well after going well after going to the ER. He is to follow up with a urologist.  He states that his neck pain is the same and has not got any better. He said he would like a PET scan.

## 2017-05-19 NOTE — Telephone Encounter (Signed)
Called regarding below message, ask patient to call nurse back today.

## 2017-05-19 NOTE — Telephone Encounter (Signed)
-----   Message from [name], MD sent at 05/19/2017  9:52 AM EDT ----- Regarding: call pt 1) He went to ER with hematuria and CT showed kidney stones. How is he? Did they refer him to se urologist? 2) Ct neck looks almost the same but measurement is difficult. If he has more pain I would recommend PET CT. Is he having more neck pain?

## 2017-05-20 ENCOUNTER — Other Ambulatory Visit: Payer: Self-pay | Admitting: Hematology and Oncology

## 2017-05-20 DIAGNOSIS — C760 Malignant neoplasm of head, face and neck: Secondary | ICD-10-CM

## 2017-05-20 DIAGNOSIS — C7800 Secondary malignant neoplasm of unspecified lung: Secondary | ICD-10-CM

## 2017-05-26 ENCOUNTER — Emergency Department (HOSPITAL_COMMUNITY)
Admission: EM | Admit: 2017-05-26 | Discharge: 2017-05-26 | Disposition: A | Discharge status: Home / Self Care | Payer: Medicaid Other | Attending: Emergency Medicine | Admitting: Emergency Medicine

## 2017-05-26 ENCOUNTER — Encounter (HOSPITAL_COMMUNITY): Payer: Self-pay

## 2017-05-26 ENCOUNTER — Emergency Department (HOSPITAL_COMMUNITY): Payer: Medicaid Other

## 2017-05-26 DIAGNOSIS — C119 Malignant neoplasm of nasopharynx, unspecified: Secondary | ICD-10-CM | POA: Insufficient documentation

## 2017-05-26 DIAGNOSIS — C7800 Secondary malignant neoplasm of unspecified lung: Secondary | ICD-10-CM | POA: Diagnosis not present

## 2017-05-26 DIAGNOSIS — N200 Calculus of kidney: Secondary | ICD-10-CM | POA: Insufficient documentation

## 2017-05-26 DIAGNOSIS — K2900 Acute gastritis without bleeding: Secondary | ICD-10-CM | POA: Insufficient documentation

## 2017-05-26 DIAGNOSIS — Z79899 Other long term (current) drug therapy: Secondary | ICD-10-CM | POA: Diagnosis not present

## 2017-05-26 DIAGNOSIS — R109 Unspecified abdominal pain: Secondary | ICD-10-CM

## 2017-05-26 DIAGNOSIS — R1032 Left lower quadrant pain: Secondary | ICD-10-CM | POA: Diagnosis present

## 2017-05-26 DIAGNOSIS — R112 Nausea with vomiting, unspecified: Secondary | ICD-10-CM | POA: Diagnosis not present

## 2017-05-26 LAB — URINALYSIS, ROUTINE W REFLEX MICROSCOPIC
BILIRUBIN URINE: NEGATIVE
GLUCOSE, UA: NEGATIVE mg/dL
HGB URINE DIPSTICK: NEGATIVE
Ketones, ur: NEGATIVE mg/dL
Leukocytes, UA: NEGATIVE
Nitrite: NEGATIVE
PH: 5 (ref 5.0–8.0)
Protein, ur: NEGATIVE mg/dL
SPECIFIC GRAVITY, URINE: 1.024 (ref 1.005–1.030)

## 2017-05-26 MED ORDER — ONDANSETRON 4 MG PO TBDP
4.0000 mg | ORAL_TABLET | Freq: Once | ORAL | Status: AC
  Administered 2017-05-26: 4 mg via ORAL
  Filled 2017-05-26: qty 1

## 2017-05-26 MED ORDER — HYDROMORPHONE HCL 1 MG/ML IJ SOLN
2.0000 mg | Freq: Once | INTRAMUSCULAR | Status: AC
  Administered 2017-05-26: 2 mg via INTRAMUSCULAR
  Filled 2017-05-26: qty 2

## 2017-05-26 MED ORDER — FAMOTIDINE 20 MG PO TABS: 20.0000 mg | ORAL_TABLET | Freq: Two times a day (BID) | ORAL | 0 refills | Status: DC

## 2017-05-26 NOTE — ED Triage Notes (Signed)
Patient states he was diagnosed last week with a left kidney stone. Pain started last night intermittently and vomited x 1 at 0500. Patient states he took 1 oxycodone at 0500 but does not help the pain.

## 2017-05-26 NOTE — ED Provider Notes (Signed)
[facility] DEPT Provider Note   CSN: [id number] Arrival date & time: 05/26/17  1047     History   Chief Complaint Chief Complaint  Patient presents with  . Flank Pain    HPI Christian Simmons is a 31 y.o. male.  Patient seen August 13 for evaluation of left-sided flank pain. Workup was consistent with a ureteral stone. Patient also has a history of a nasopharyngeal carcinoma. Undergoing immune therapy. The patient states that the carcinoma has spread. Patient did get better from the left-sided flank pain then it recurred last night. One episode of vomiting. This time the flank pain feels different than it did before. No fevers. No diarrhea.      Past Medical History:  Diagnosis Date  . Arrhythmia 12/25/2015  . Carcinoma ([place])   . Fatigue 06/01/2014  . Hypothyroidism   . Infection of eyelash follicle of left eye 5/78/4696  . 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 Neuropathy   . Radiation 07/18/14-07/31/14   Left upper lobe  40 gy in 10 fractions  . 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 Medically noncompliant 04/13/2017  . Tobacco abuse 01/18/2017  . Vertigo, intermittent 11/02/2016  . Port catheter in place 04/01/2016  . Epigastric pain 02/10/2016  . Gastritis without bleeding 01/22/2016  . Arrhythmia 12/25/2015  . Infection of eyelash follicle of left eye 29/52/8413  . Poor dentition 10/16/2015  . Other constipation 05/16/2015  . Neck pain on right side 01/28/2015  . 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 Acquired hypothyroidism 06/09/2014  . 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 Chronic fatigue 06/01/2014    Past Surgical History:  Procedure Laterality Date  . LUNG REMOVAL, PARTIAL  05/03/2012   left upper lobectomy  . nasal biopsy    . RADICAL NECK DISSECTION    . VIDEO  BRONCHOSCOPY N/A 06/29/2014   Procedure: VIDEO BRONCHOSCOPY ;  Surgeon: Melrose Nakayama, MD;  Location: [facility];  Service: Thoracic;  Laterality: N/A;       Home Medications    Prior to Admission medications   Medication Sig Start Date End Date Taking? Authorizing Provider  cyclobenzaprine (FLEXERIL) 5 MG tablet Take 1 tablet (5 mg total) by mouth 3 (three) times daily as needed for muscle spasms. 05/10/17  Yes Gorsuch, Ni, MD  diclofenac (VOLTAREN) 50 MG EC tablet Take 1 tablet (50 mg total) by mouth 3 (three) times daily. Patient taking differently: Take 50 mg by mouth 3 (three) times daily as needed for mild pain.  12/10/16  Yes [name], MD  levothyroxine (SYNTHROID, LEVOTHROID) 100 MCG tablet Take 1 tablet (100 mcg total) by mouth daily before breakfast. 03/15/17  Yes Gorsuch, Ni, MD  lidocaine-prilocaine (EMLA) cream Apply 1 application topically as needed. Patient taking differently: Apply 1 application topically as needed (for port access).  10/08/16  Yes [name], MD  meclizine (ANTIVERT) 25 MG tablet Take 1 tablet (25 mg total) by mouth 3 (three) times daily as needed for dizziness. 01/18/17  Yes Gorsuch, Ni, MD  oxyCODONE (ROXICODONE) 15 MG immediate release tablet Take 1 tablet (15 mg total) by mouth every 4 (four) hours as needed for pain. Patient taking differently: Take 7.5-15 mg by mouth every 4 (four) hours as needed for pain.  05/10/17  Yes [name], MD  famotidine (PEPCID) 20 MG tablet Take 1 tablet (20 mg total) by mouth 2 (two) times daily. 05/26/17   Fredia Sorrow, MD    Family History Family History  Problem Relation Age of Onset  . Hypertension Mother   . Diabetes Mother   . Hypertension Father   . Diabetes Father     Social History Social History  Substance Use Topics  . Smoking status: Never Smoker  . Smokeless tobacco: Never Used  . Alcohol use No     Allergies   Heparin   Review of Systems Review of Systems  Constitutional: Negative for  fever.  HENT: Negative for congestion.   Respiratory: Negative for shortness of breath.   Cardiovascular: Negative for chest pain.  Gastrointestinal: Positive for abdominal pain, nausea and vomiting. Negative for blood in stool.  Genitourinary: Positive for flank pain.  Musculoskeletal: Negative for back pain.  Skin: Negative for rash.  Neurological: Negative for headaches.  Hematological: Does not bruise/bleed easily.  Psychiatric/Behavioral: Negative for confusion.     Physical Exam Updated Vital Signs BP 123/86 (BP Location: Right Arm)   Pulse 69   Resp 18   Ht 1.746 m (5' 8.75")   Wt 77.6 kg (171 lb)   SpO2 98%   BMI 25.44 kg/m   Physical Exam  Constitutional: He is oriented to person, place, and time. He appears well-developed and well-nourished. No distress.  HENT:  Head: Normocephalic and atraumatic.  Mouth/Throat: Oropharynx is clear and moist.  Eyes: Pupils are equal, round, and reactive to light. Conjunctivae and EOM are normal.  Neck: Normal range of motion. Neck supple.  Cardiovascular: Normal rate, regular rhythm and normal heart sounds.   Pulmonary/Chest: Effort normal and breath sounds normal. No respiratory distress.  Abdominal: Soft. Bowel sounds are normal.  Musculoskeletal: Normal range of motion.  Neurological: He is alert and oriented to person, place, and time. No cranial nerve deficit or sensory deficit. He exhibits normal muscle tone. Coordination normal.  Skin: Skin is warm.  Nursing note and vitals reviewed.    ED Treatments / Results  Labs (all labs ordered are listed, but only abnormal results are displayed) Labs Reviewed  URINALYSIS, ROUTINE W REFLEX MICROSCOPIC    EKG  EKG Interpretation None       Radiology Ct Renal Stone Study  Result Date: 05/26/2017 CLINICAL DATA:  Left flank pain EXAM: CT ABDOMEN AND PELVIS WITHOUT CONTRAST TECHNIQUE: Multidetector CT imaging of the abdomen and pelvis was performed following the standard  protocol without oral or intravenous contrast material administration. COMPARISON:  Feb 28, 2016 FINDINGS: Lower chest: Lung bases are clear. Hepatobiliary: No focal liver lesions are evident on this noncontrast enhanced study. Gallbladder wall is not appreciably thickened. There is no appreciable biliary duct dilatation. Pancreas: No pancreatic mass or inflammatory focus. Spleen: No splenic lesions are evident. Adrenals/Urinary Tract: Adrenals appear normal bilaterally. There is no renal mass or appreciable hydronephrosis on either side. There is a junctional parenchymal defect in the upper right kidney, an anatomic variant. On the right, there is a a 1 mm calculus in the mid kidney region. A second 1 mm calculus is noted in the lower pole region. Several tiny calculi are noted in the upper pole regions. On the left, there are two, 1 mm calculi in the upper pole region. There is a 4 x 3 mm calculus in the mid kidney. There is a 1 mm calculus in the mid kidney on the left as well. There is no evident ureteral  calculus on either side. Urinary bladder is midline with wall thickness within normal limits. Stomach/Bowel: There is wall thickening along the lesser curvature of the stomach proximally, a finding that was also present on prior study. There is no appreciable small or large bowel wall thickening. No mesenteric thickening. No evident bowel obstruction. No free air or portal venous air. Vascular/Lymphatic: There is no abdominal aortic aneurysm. No vascular lesions are evident on this noncontrast enhanced study. There is no adenopathy appreciable in the abdomen or pelvis. Reproductive: Prostate and seminal vesicles appear normal in size contour. No evident pelvic mass. Other: Appendix appears normal. There is no ascites or abscess in the abdomen or pelvis. There is a minimal ventral hernia containing only fat. Musculoskeletal: There are no blastic or lytic bone lesions. There is no intramuscular or abdominal wall  lesion evident. IMPRESSION: 1. Small nonobstructing calculi in each kidney. Largest calculus is on the left measuring 4 x 3 mm. No hydronephrosis on either side. No ureteral calculus on either side. 2. Wall thickening along the greater curvature the stomach, a finding that has been noted previously. Suspect chronic gastritis. No other bowel wall thickening. No bowel obstruction. No abscess. Appendix appears normal. 3.  Minimal ventral hernia containing only fat. Electronically Signed   By: Lowella Grip III M.D.   On: 05/26/2017 13:01    Procedures Procedures (including critical care time)  Medications Ordered in ED Medications  HYDROmorphone (DILAUDID) injection 2 mg (2 mg Intramuscular Given 05/26/17 1237)  ondansetron (ZOFRAN-ODT) disintegrating tablet 4 mg (4 mg Oral Given 05/26/17 1236)     Initial Impression / Assessment and Plan / ED Course  I have reviewed the triage vital signs and the nursing notes.  Pertinent labs & imaging results that were available during my care of the patient were reviewed by me and considered in my medical decision making (see chart for details).    Workup here today without evidence of any recurrent or persistent ureteral stone. Was some inflammation in the gastric area. Patient's pain is mostly left flank. 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 Recommending that he call them for follow-up. Also given referral to urology patient has multiple kidney stones up in the kidneys and will certainly have ureteral stones in the future. Patient has pain medication to take at home. To the gastritis will start on a two-week course of Pepcid.  Final Clinical Impressions(s) / ED Diagnoses   Final diagnoses:  Left flank pain  Acute gastritis without hemorrhage, unspecified gastritis type    New Prescriptions New Prescriptions   FAMOTIDINE (PEPCID) 20 MG TABLET    Take 1 tablet (20 mg total) by  mouth 2 (two) times daily.     Fredia Sorrow, MD 05/26/17 [phone]

## 2017-05-26 NOTE — Discharge Instructions (Signed)
Follow-up of the hematology oncology clinic. Also make appointment to follow-up with urology. Take the Pepcid as directed for the next 14 days. Return for any new or worse symptoms.

## 2017-05-26 NOTE — ED Notes (Signed)
Patient transported to CT 

## 2017-06-03 ENCOUNTER — Ambulatory Visit (HOSPITAL_COMMUNITY)
Admission: RE | Admit: 2017-06-03 | Discharge: 2017-06-03 | Disposition: A | Discharge status: Home / Self Care | Payer: Medicaid Other | Source: Ambulatory Visit | Attending: Hematology and Oncology | Admitting: Hematology and Oncology

## 2017-06-03 DIAGNOSIS — J320 Chronic maxillary sinusitis: Secondary | ICD-10-CM | POA: Insufficient documentation

## 2017-06-03 DIAGNOSIS — C760 Malignant neoplasm of head, face and neck: Secondary | ICD-10-CM

## 2017-06-03 DIAGNOSIS — R911 Solitary pulmonary nodule: Secondary | ICD-10-CM | POA: Diagnosis not present

## 2017-06-03 DIAGNOSIS — N200 Calculus of kidney: Secondary | ICD-10-CM | POA: Insufficient documentation

## 2017-06-03 DIAGNOSIS — C7800 Secondary malignant neoplasm of unspecified lung: Secondary | ICD-10-CM

## 2017-06-03 DIAGNOSIS — R938 Abnormal findings on diagnostic imaging of other specified body structures: Secondary | ICD-10-CM | POA: Insufficient documentation

## 2017-06-03 LAB — GLUCOSE, CAPILLARY: Glucose-Capillary: 97 mg/dL (ref 65–99)

## 2017-06-03 MED ORDER — FLUDEOXYGLUCOSE F - 18 (FDG) INJECTION
10.3000 | Freq: Once | INTRAVENOUS | Status: AC | PRN
  Administered 2017-06-03: 10.3 via INTRAVENOUS

## 2017-06-08 ENCOUNTER — Ambulatory Visit (HOSPITAL_BASED_OUTPATIENT_CLINIC_OR_DEPARTMENT_OTHER): Payer: Medicaid Other | Admitting: Hematology and Oncology

## 2017-06-08 ENCOUNTER — Other Ambulatory Visit (HOSPITAL_BASED_OUTPATIENT_CLINIC_OR_DEPARTMENT_OTHER): Payer: Medicaid Other

## 2017-06-08 ENCOUNTER — Encounter: Payer: Self-pay | Admitting: Hematology and Oncology

## 2017-06-08 ENCOUNTER — Ambulatory Visit (HOSPITAL_BASED_OUTPATIENT_CLINIC_OR_DEPARTMENT_OTHER): Payer: Medicaid Other

## 2017-06-08 ENCOUNTER — Telehealth: Payer: Self-pay | Admitting: Hematology and Oncology

## 2017-06-08 ENCOUNTER — Ambulatory Visit: Payer: Medicaid Other

## 2017-06-08 DIAGNOSIS — Z79899 Other long term (current) drug therapy: Secondary | ICD-10-CM | POA: Diagnosis not present

## 2017-06-08 DIAGNOSIS — C760 Malignant neoplasm of head, face and neck: Secondary | ICD-10-CM | POA: Diagnosis present

## 2017-06-08 DIAGNOSIS — C7800 Secondary malignant neoplasm of unspecified lung: Secondary | ICD-10-CM

## 2017-06-08 DIAGNOSIS — Z5112 Encounter for antineoplastic immunotherapy: Secondary | ICD-10-CM | POA: Diagnosis not present

## 2017-06-08 DIAGNOSIS — C7989 Secondary malignant neoplasm of other specified sites: Secondary | ICD-10-CM

## 2017-06-08 DIAGNOSIS — E039 Hypothyroidism, unspecified: Secondary | ICD-10-CM

## 2017-06-08 DIAGNOSIS — C7801 Secondary malignant neoplasm of right lung: Secondary | ICD-10-CM

## 2017-06-08 DIAGNOSIS — M542 Cervicalgia: Secondary | ICD-10-CM

## 2017-06-08 DIAGNOSIS — Z95828 Presence of other vascular implants and grafts: Secondary | ICD-10-CM

## 2017-06-08 LAB — COMPREHENSIVE METABOLIC PANEL
ALBUMIN: 4.1 g/dL (ref 3.5–5.0)
ALK PHOS: 49 U/L (ref 40–150)
ALT: 18 U/L (ref 0–55)
ANION GAP: 8 meq/L (ref 3–11)
AST: 25 U/L (ref 5–34)
BUN: 16.1 mg/dL (ref 7.0–26.0)
CALCIUM: 9.7 mg/dL (ref 8.4–10.4)
CO2: 26 mEq/L (ref 22–29)
Chloride: 105 mEq/L (ref 98–109)
Creatinine: 1 mg/dL (ref 0.7–1.3)
Glucose: 149 mg/dl — ABNORMAL HIGH (ref 70–140)
POTASSIUM: 3.7 meq/L (ref 3.5–5.1)
SODIUM: 139 meq/L (ref 136–145)
TOTAL PROTEIN: 7.1 g/dL (ref 6.4–8.3)
Total Bilirubin: 0.36 mg/dL (ref 0.20–1.20)

## 2017-06-08 LAB — CBC WITH DIFFERENTIAL/PLATELET
BASO%: 0.8 % (ref 0.0–2.0)
Basophils Absolute: 0 10*3/uL (ref 0.0–0.1)
EOS%: 2.2 % (ref 0.0–7.0)
Eosinophils Absolute: 0.1 10*3/uL (ref 0.0–0.5)
HEMATOCRIT: 44.4 % (ref 38.4–49.9)
HGB: 15.2 g/dL (ref 13.0–17.1)
LYMPH#: 1.2 10*3/uL (ref 0.9–3.3)
LYMPH%: 24.8 % (ref 14.0–49.0)
MCH: 30.6 pg (ref 27.2–33.4)
MCHC: 34.2 g/dL (ref 32.0–36.0)
MCV: 89.5 fL (ref 79.3–98.0)
MONO#: 0.4 10*3/uL (ref 0.1–0.9)
MONO%: 8.1 % (ref 0.0–14.0)
NEUT%: 64.1 % (ref 39.0–75.0)
NEUTROS ABS: 3 10*3/uL (ref 1.5–6.5)
PLATELETS: 165 10*3/uL (ref 140–400)
RBC: 4.96 10*6/uL (ref 4.20–5.82)
RDW: 13.9 % (ref 11.0–14.6)
WBC: 4.7 10*3/uL (ref 4.0–10.3)

## 2017-06-08 LAB — TSH: TSH: 2.147 m(IU)/L (ref 0.320–4.118)

## 2017-06-08 MED ORDER — ANTICOAGULANT SODIUM CITRATE 4% (200MG/5ML) IV SOLN
5.0000 mL | Freq: Once | Status: AC
  Administered 2017-06-08: 5 mL via INTRAVENOUS
  Filled 2017-06-08: qty 5

## 2017-06-08 MED ORDER — SODIUM CHLORIDE 0.9% FLUSH
10.0000 mL | Freq: Once | INTRAVENOUS | Status: AC
  Administered 2017-06-08: 10 mL
  Filled 2017-06-08: qty 10

## 2017-06-08 MED ORDER — SODIUM CHLORIDE 0.9 % IV SOLN
Freq: Once | INTRAVENOUS | Status: AC
  Administered 2017-06-08: 12:00:00 via INTRAVENOUS

## 2017-06-08 MED ORDER — SODIUM CHLORIDE 0.9 % IJ SOLN
10.0000 mL | INTRAMUSCULAR | Status: DC | PRN
  Administered 2017-06-08: 10 mL
  Filled 2017-06-08: qty 10

## 2017-06-08 MED ORDER — SODIUM CHLORIDE 0.9 % IV SOLN
240.0000 mg | Freq: Once | INTRAVENOUS | Status: AC
  Administered 2017-06-08: 240 mg via INTRAVENOUS
  Filled 2017-06-08: qty 24

## 2017-06-08 NOTE — Assessment & Plan Note (Signed)
He has intermittent right neck pain that comes and goes Clinically, his neck is thickened & woody from prior surgery and fibrosis from radiation Recent PET CT scan is reviewed with the patient and in my eyes there is little clinical change over the past 20 months since his PET CT scan from January 2017 He was able to discontinue all pain medicine recently and felt fine We discussed the risk and benefit of pursuing needle biopsy Poor wound healing and acute exacerbation of pain could be possibility. Ultimately, we agreed to continue treatment as scheduled every month I will repeat imaging study again next year

## 2017-06-08 NOTE — Progress Notes (Signed)
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 Pan-endoscopy elsewhere showed cancer from nasopharyngeal space.      01/04/2007 - 02/20/2007 Chemotherapy    He received 2 cycles of cisplatin and 5FU followed by concurrent chemo with weekly cisplatin and radiation. He only received 2 doses of chemo due to severe mucositis, nausea and weight loss.      04/05/2007 - 08/04/2007 Chemotherapy    He received 4 more courses of cisplatin with 5FU and had complete response      07/05/2009 Procedure    Fine-needle aspirate of the right level II lymph nodes come from recurrent metastatic disease. Repeat endoscopy and CT scan show no evidence of disease elsewhere.      07/08/2009 - 12/02/2009 Chemotherapy    He was given 6 cycles of carboplatin, 5-FU and docetaxel      12/03/2009 Surgery    He has surgery to the residual lymph node on the right neck which showed no evidence of disease.      02/22/2012 Imaging    Repeat imaging study showed large recurrent mass. He was referred  elsewhere for further treatment.      05/03/2012 Surgery    He underwent left upper lobectomy.      04/29/2013 Imaging    PEt scan showed lesion on right level II B and lower lung was abnormal      06/03/2013 - 02/02/2014 Chemotherapy    He had 6 cycles of chemotherapy when he was found to have recurrence of cancer and had received oxaliplatin and capecitabine      06/07/2014 Imaging    PET CT scan showed persistent disease in the right neck lymph nodes and left lung      06/29/2014 Procedure    Accession: [id number] repeat LUL biopsy confirmed metastatic cancer      07/18/2014 - 07/31/2014 Radiation Therapy    He received palliative radiation therapy to the lungs      10/10/2014 Imaging    CT scan of the chest, abdomen and pelvis show regression in the size of the lung nodule in the left upper lobe and stable pulmonary nodules      01/24/2015 Imaging    CT scan showed stable disease in neck and lung      06/19/2015 Imaging    CT scan of the neck and the chest show possible mild progression of the nodule in the right side of the neck.      06/25/2015 Imaging    PET scan confirmed disease recurrence in the neck      07/07/2015  Imaging    He had MRI neck [facility]      09/03/2015 -  Chemotherapy    He received palliative chemo with Nivolumab      10/29/2015 Imaging    PET CT showed positive response to Rx      02/28/2016 Imaging    Ct abdomen showed abnormal thinkening in his stomach      03/03/2016 Imaging    CT: Right sternocleidomastoid muscle metastasis appears less distinct but otherwise not significantly changed in size or configuration since 06/19/2015.2. Left level 3 lymph node which was hypermetabolic by PET-CT in January 2017 appears slightly smaller      04/01/2016 Imaging    CT cervical spine showed no acute fracture or traumatic malalignment in the cervical spine      04/22/2016 Procedure    Port-a-cath placed.      06/16/2016 Imaging    Ct neck showed  right sternocleidomastoid muscle metastasis is further decreased in conspicuity since May, and has mildly decreased in size since September 2016. Continued stability of sub-centimeter left cervical lymph nodes. No new or progressive metastatic disease in the neck.      06/16/2016 Imaging    CT chest showed stable masslike radiation fibrosis in the left upper lobe. Stable subcentimeter pulmonary nodules in the bilateral lower lobes. No new or progressive metastatic disease in the chest. Nonobstructing left renal stone.      10/13/2016 Imaging    Ct neck showed unchanged right sternocleidomastoid muscle metastasis. Unchanged subcentimeter left cervical lymph nodes. No evidence of new or progressive metastatic disease in the neck.      10/13/2016 Imaging    CT chest showed tiny hypervascular foci in the liver, not definitely seen on prior imaging of 06/16/2016 and 02/28/2016. Abdomen MRI without and with contrast recommended to further evaluate as metastatic disease is a concern. 2. Stable appearance of post treatment changes left upper lung and scattered tiny bilateral pulmonary nodules.      02/11/2017 Imaging    Ct neck: Lymph node mass right posterior neck appears improved from the prior study. Small posterior lymph nodes on the left unchanged. Occluded right jugular vein unchanged.      02/11/2017 Imaging    1. Similar appearance of postsurgical and radiation changes in the left upper lobe. 2. Similar bilateral pulmonary nodules. 3. No thoracic adenopathy. 4. Subtle foci of post-contrast enhancement within the liver are suboptimally characterized on this nondedicated study. Likely similar. These could either be re-evaluated at followup or more entirely characterized with abdominal MRI. 5. Left nephrolithiasis.      05/19/2017 Imaging    Matted lymph node mass right posterior neck appears larger in the recent CT. Accurate measurements difficult due to infiltrating tumor margins and  infiltration of the muscle. Right jugular vein again appears occluded or resected. Small left posterior lymph nodes stable. Left upper lobe airspace density stable and similar to the prior CT      06/03/2017 PET scan    1. Hypermetabolic ill-defined right level IIb lymph node, about 1.3 cm in diameter with maximum SUV 9.5 (formerly 8.1). Appearance suspicious for residual/recurrent malignancy. No worrisome left-sided lesion. 2. Left suprahilar indistinct opacity demonstrates no worrisome hypermetabolic activity. The 5 mm left lower lobe pulmonary nodule is stable and not currently hypermetabolic although below sensitive PET-CT size thresholds. 3. Other imaging findings of potential clinical significance: Bilateral nonobstructive nephrolithiasis. Chronic bilateral maxillary sinusitis.       INTERVAL HISTORY: Please see below for problem oriented  charting. He returns again to review test results He continued to have intermittent neck pain, improved since he self discontinued oxycodone over a week ago He denies further hematuria No recent chest pain or shortness of breath.  REVIEW OF SYSTEMS:   Constitutional: Denies fevers, chills or abnormal weight loss Eyes: Denies blurriness of vision Ears, nose, mouth, throat, and face: Denies mucositis or sore throat Respiratory: Denies cough, dyspnea or wheezes Cardiovascular: Denies palpitation, chest discomfort or lower extremity swelling Gastrointestinal:  Denies nausea, heartburn or change in bowel habits Skin: Denies abnormal skin rashes Lymphatics: Denies new lymphadenopathy or easy bruising Neurological:Denies numbness, tingling or new weaknesses Behavioral/Psych: Mood is stable, no new changes  All other systems were reviewed with the patient and are negative.  I have reviewed the past medical history, past surgical history, social history and family history with the patient and they are unchanged from previous note.  ALLERGIES:  is  allergic to heparin.  MEDICATIONS:  Current Outpatient Prescriptions  Medication Sig Dispense Refill  . cyclobenzaprine (FLEXERIL) 5 MG tablet Take 1 tablet (5 mg total) by mouth 3 (three) times daily as needed for muscle spasms. 60 tablet 0  . diclofenac (VOLTAREN) 50 MG EC tablet Take 1 tablet (50 mg total) by mouth 3 (three) times daily. (Patient taking differently: Take 50 mg by mouth 3 (three) times daily as needed for mild pain. ) 60 tablet 1  . famotidine (PEPCID) 20 MG tablet Take 1 tablet (20 mg total) by mouth 2 (two) times daily. 30 tablet 0  . levothyroxine (SYNTHROID, LEVOTHROID) 100 MCG tablet Take 1 tablet (100 mcg total) by mouth daily before breakfast. 90 tablet 9  . lidocaine-prilocaine (EMLA) cream Apply 1 application topically as needed. (Patient taking differently: Apply 1 application topically as needed (for port access). ) 30 g 3  . meclizine (ANTIVERT) 25 MG tablet Take 1 tablet (25 mg total) by mouth 3 (three) times daily as needed for dizziness. 30 tablet 0  . oxyCODONE (ROXICODONE) 15 MG immediate release tablet Take 1 tablet (15 mg total) by mouth every 4 (four) hours as needed for pain. (Patient taking differently: Take 7.5-15 mg by mouth every 4 (four) hours as needed for pain. ) 60 tablet 0   No current facility-administered medications for this visit.    Facility-Administered Medications Ordered in Other Visits  Medication Dose Route Frequency Provider Last Rate Last Dose  . sodium chloride 0.9 % injection 10 mL  10 mL Intracatheter PRN Alvy Bimler, Dalis Beers, MD      . sodium chloride 0.9 % injection 10 mL  10 mL Intracatheter PRN Alvy Bimler, Tekia Waterbury, MD   10 mL at 06/08/17 1328    PHYSICAL EXAMINATION: ECOG PERFORMANCE STATUS: 1 - Symptomatic but completely ambulatory  Vitals:   06/08/17 1044  BP: 114/66  Pulse: 81  Resp: 20  Temp: 98.2 F (36.8 C)  SpO2: 100%   Filed Weights   06/08/17 1044  Weight: 172 lb 4.8 oz (78.2 kg)    GENERAL:alert, no distress and  comfortable SKIN: skin color, texture, turgor are normal, no rashes or significant lesions EYES: normal, Conjunctiva are pink and non-injected, sclera clear OROPHARYNX:no exudate, no erythema and lips, buccal mucosa, and tongue normal  NECK:  fibrotic neck, no palpable abnormality, well-healed surgical scar  LYMPH:  no palpable lymphadenopathy in the cervical, axillary or inguinal LUNGS: clear to auscultation and percussion with normal breathing effort HEART: regular rate & rhythm and no murmurs and no lower extremity edema ABDOMEN:abdomen soft,  non-tender and normal bowel sounds Musculoskeletal:no cyanosis of digits and no clubbing  NEURO: alert & oriented x 3 with fluent speech, no focal motor/sensory deficits  LABORATORY DATA:  I have reviewed the data as listed    Component Value Date/Time   NA 139 06/08/2017 1017   K 3.7 06/08/2017 1017   CL 104 06/29/2014 0655   CO2 26 06/08/2017 1017   GLUCOSE 149 (H) 06/08/2017 1017   BUN 16.1 06/08/2017 1017   CREATININE 1.0 06/08/2017 1017   CALCIUM 9.7 06/08/2017 1017   PROT 7.1 06/08/2017 1017   ALBUMIN 4.1 06/08/2017 1017   AST 25 06/08/2017 1017   ALT 18 06/08/2017 1017   ALKPHOS 49 06/08/2017 1017   BILITOT 0.36 06/08/2017 1017   GFRNONAA >90 06/29/2014 0655   GFRAA >90 06/29/2014 0655    No results found for: SPEP, UPEP  Lab Results  Component Value Date   WBC 4.7 06/08/2017   NEUTROABS 3.0 06/08/2017   HGB 15.2 06/08/2017   HCT 44.4 06/08/2017   MCV 89.5 06/08/2017   PLT 165 06/08/2017      Chemistry      Component Value Date/Time   NA 139 06/08/2017 1017   K 3.7 06/08/2017 1017   CL 104 06/29/2014 0655   CO2 26 06/08/2017 1017   BUN 16.1 06/08/2017 1017   CREATININE 1.0 06/08/2017 1017      Component Value Date/Time   CALCIUM 9.7 06/08/2017 1017   ALKPHOS 49 06/08/2017 1017   AST 25 06/08/2017 1017   ALT 18 06/08/2017 1017   BILITOT 0.36 06/08/2017 1017       RADIOGRAPHIC STUDIES: I have reviewed  imaging study with the patient I have personally reviewed the radiological images as listed and agreed with the findings in the report. Ct Soft Tissue Neck W Contrast  Result Date: 05/19/2017 CLINICAL DATA:  Nasopharyngeal carcinoma. Metastatic disease to the lung with left upper lobe resection. On going immunotherapy. Right neck pain 1 month EXAM: CT NECK WITH CONTRAST TECHNIQUE: Multidetector CT imaging of the neck was performed using the standard protocol following the bolus administration of intravenous contrast. CONTRAST:  100 mL Isovue 300 IV COMPARISON:  CT neck 02/11/2017, PET 10/29/2015 FINDINGS: Pharynx and larynx: Negative for nasopharyngeal mass. Oropharynx and hypopharynx normal. Larynx normal. Salivary glands: Atrophic submandibular and parotid glands bilaterally, unchanged. Thyroid: Negative Lymph nodes: Matted lymph node mass in the right posterior neck is difficult to measure but appears larger. This shows central enhancement and ill-defined margins. Estimated size 30 x 18 mm. This is posterior to the right carotid artery and appears to infiltrate the muscles Small lymph nodes posterior to the left jugular vein unchanged measuring 4 mm, 3 mm, and 5 mm Vascular: Right jugular vein not visualized as noted previously and may be occluded. Left jugular vein patent. Carotid artery patent bilaterally. Limited intracranial: Negative Visualized orbits: Negative Mastoids and visualized paranasal sinuses: Mucosal edema in the maxillary sinus Skeleton: No skeletal lesions. Poor dentition with periapical lucencies around multiple teeth. Upper chest: Left upper lobe airspace disease as well as surgical suture line unchanged from the prior study. Airspace density measures approximately 2.8 x 2.9 cm Other: None IMPRESSION: Matted lymph node mass right posterior neck appears larger in the recent CT. Accurate measurements difficult due to infiltrating tumor margins and infiltration of the muscle. Right jugular  vein again appears occluded or resected. Small left posterior lymph nodes stable. Left upper lobe airspace density stable and similar to the prior CT  Electronically Signed   By: Franchot Gallo M.D.   On: 05/19/2017 09:41   Nm Pet Image Restag (ps) Skull Base To Thigh  Result Date: 06/03/2017 CLINICAL DATA:  Subsequent treatment strategy for nasopharyngeal cancer with metastatic disease to lung. EXAM: NUCLEAR MEDICINE PET SKULL BASE TO THIGH TECHNIQUE: 10.3 mCi F-18 FDG was injected intravenously. Full-ring PET imaging was performed from the skull base to thigh after the radiotracer. CT data was obtained and used for attenuation correction and anatomic localization. FASTING BLOOD GLUCOSE:  Value: 97 mg/dl COMPARISON:  Multiple exams, including PET-CT from 10/29/2015 and CT abdomen from 05/26/2017 FINDINGS: NECK Right level IIb lymph node with maximum SUV 9.5 (formerly 8.1) has a short axis diameter of abnormal metabolic activity of about 1.3 cm. Ill-defined margins of this lymph node on the CT data. Chronic bilateral maxillary sinusitis. A left level VI lymph node measures 0.7 cm in short axis on image 50/5 and has a maximum standard uptake value of 1.8 (formerly 3.1). CHEST Right Port-A-Cath tip:  Right atrium. Left suprahilar airspace opacity with adjacent wedge resection clips, maximum SUV 1.8, formerly 2.6. 5 mm left lower lobe pulmonary nodule on image 39/9, no change from 10/29/2015, not hypermetabolic. ABDOMEN/PELVIS No abnormal hypermetabolic activity within the liver, pancreas, adrenal glands, or spleen. No hypermetabolic lymph nodes in the abdomen or pelvis. Bilateral nonobstructive nephrolithiasis. SKELETON No focal hypermetabolic activity to suggest skeletal metastasis. IMPRESSION: 1. Hypermetabolic ill-defined right level IIb lymph node, about 1.3 cm in diameter with maximum SUV 9.5 (formerly 8.1). Appearance suspicious for residual/recurrent malignancy. No worrisome left-sided lesion. 2. Left  suprahilar indistinct opacity demonstrates no worrisome hypermetabolic activity. The 5 mm left lower lobe pulmonary nodule is stable and not currently hypermetabolic although below sensitive PET-CT size thresholds. 3. Other imaging findings of potential clinical significance: Bilateral nonobstructive nephrolithiasis. Chronic bilateral maxillary sinusitis. Electronically Signed   By: Van Clines M.D.   On: 06/03/2017 14:18   Ct Renal Stone Study  Result Date: 05/26/2017 CLINICAL DATA:  Left flank pain EXAM: CT ABDOMEN AND PELVIS WITHOUT CONTRAST TECHNIQUE: Multidetector CT imaging of the abdomen and pelvis was performed following the standard protocol without oral or intravenous contrast material administration. COMPARISON:  Feb 28, 2016 FINDINGS: Lower chest: Lung bases are clear. Hepatobiliary: No focal liver lesions are evident on this noncontrast enhanced study. Gallbladder wall is not appreciably thickened. There is no appreciable biliary duct dilatation. Pancreas: No pancreatic mass or inflammatory focus. Spleen: No splenic lesions are evident. Adrenals/Urinary Tract: Adrenals appear normal bilaterally. There is no renal mass or appreciable hydronephrosis on either side. There is a junctional parenchymal defect in the upper right kidney, an anatomic variant. On the right, there is a a 1 mm calculus in the mid kidney region. A second 1 mm calculus is noted in the lower pole region. Several tiny calculi are noted in the upper pole regions. On the left, there are two, 1 mm calculi in the upper pole region. There is a 4 x 3 mm calculus in the mid kidney. There is a 1 mm calculus in the mid kidney on the left as well. There is no evident ureteral calculus on either side. Urinary bladder is midline with wall thickness within normal limits. Stomach/Bowel: There is wall thickening along the lesser curvature of the stomach proximally, a finding that was also present on prior study. There is no appreciable  small or large bowel wall thickening. No mesenteric thickening. No evident bowel obstruction. No free air or portal venous  air. Vascular/Lymphatic: There is no abdominal aortic aneurysm. No vascular lesions are evident on this noncontrast enhanced study. There is no adenopathy appreciable in the abdomen or pelvis. Reproductive: Prostate and seminal vesicles appear normal in size contour. No evident pelvic mass. Other: Appendix appears normal. There is no ascites or abscess in the abdomen or pelvis. There is a minimal ventral hernia containing only fat. Musculoskeletal: There are no blastic or lytic bone lesions. There is no intramuscular or abdominal wall lesion evident. IMPRESSION: 1. Small nonobstructing calculi in each kidney. Largest calculus is on the left measuring 4 x 3 mm. No hydronephrosis on either side. No ureteral calculus on either side. 2. Wall thickening along the greater curvature the stomach, a finding that has been noted previously. Suspect chronic gastritis. No other bowel wall thickening. No bowel obstruction. No abscess. Appendix appears normal. 3.  Minimal ventral hernia containing only fat. Electronically Signed   By: Lowella Grip III M.D.   On: 05/26/2017 13:01   Ct Renal Stone Study  Result Date: 05/17/2017 CLINICAL DATA:  LEFT flank pain since 2 p.m., assess for kidney stone. History of nasopharyngeal carcinoma 2007. EXAM: CT ABDOMEN AND PELVIS WITHOUT CONTRAST TECHNIQUE: Multidetector CT imaging of the abdomen and pelvis was performed following the standard protocol without IV contrast. COMPARISON:  None. FINDINGS: LOWER CHEST: Lung bases are clear. The visualized heart size is normal. No pericardial effusion. HEPATOBILIARY: Normal. PANCREAS: Normal. SPLEEN: Normal. ADRENALS/URINARY TRACT: Kidneys are orthotopic, demonstrating normal size and morphology. Numerous punctate bilateral nephrolithiasis in addition to 3 mm LEFT lower pole nephrolithiasis. Mild LEFT hydroureteronephrosis  the ureteral calcification. Limited assessment for renal masses on this nonenhanced examination. The unopacified ureters are normal in course and caliber. Urinary bladder is partially distended harboring no intravesicular calculi. Urachal remanent. Normal adrenal glands. STOMACH/BOWEL: The stomach, small and large bowel are normal in course and caliber without inflammatory changes, sensitivity decreased by lack of enteric contrast. Normal appendix. VASCULAR/LYMPHATIC: Aortoiliac vessels are normal in course and caliber. No lymphadenopathy by CT size criteria. REPRODUCTIVE: Normal. OTHER: No intraperitoneal free fluid or free air. MUSCULOSKELETAL: Non-acute. IMPRESSION: 1. Mild LEFT hydroureteronephrosis without ureteral calculus, seen with passed stone or, urinary tract infection. 2. Bilateral nephrolithiasis measuring to 3 mm on the LEFT. Electronically Signed   By: Elon Alas M.D.   On: 05/17/2017 17:33    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[place]) He has intermittent right neck pain that comes and goes Clinically, his neck is thickened & woody from prior surgery and fibrosis from radiation Recent PET CT scan is reviewed with the patient and in my eyes there is little clinical change over the past 20 months since his PET CT scan from January 2017 He was able to discontinue all pain medicine recently and felt fine We discussed the risk and benefit of pursuing needle biopsy Poor wound healing and acute exacerbation of pain could be possibility. Ultimately, we agreed to continue treatment as scheduled every month I will repeat imaging study again next year   Neck pain on right side The patient has significant fibrosis and scar tissue from prior surgery and radiation. I recommend continue conservative management with physical therapy, topical lidocaine, muscle relaxant and pain medicine as needed   Metastasis to lung His symptoms of intermittent chest wall pain is likely  related to prior surgery.  The patient is reassured CT chest showed no evidence of disease progression I plan to repeat CT chest again next year  No orders of the defined types were placed in this encounter.  All questions were answered. The patient knows to call the clinic with any problems, questions or concerns. No barriers to learning was detected. I spent 15 minutes counseling the patient face to face. The total time spent in the appointment was 20 minutes and more than 50% was on counseling and review of test results     [name], MD 06/08/2017 2:49 PM

## 2017-06-08 NOTE — Telephone Encounter (Signed)
Scheduled appt per 9/4 los - Gave patient AVS and calender per los.

## 2017-06-08 NOTE — Assessment & Plan Note (Signed)
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 I recommend continue conservative management with physical therapy, topical lidocaine, muscle relaxant and pain medicine as needed

## 2017-06-08 NOTE — Patient Instructions (Signed)
[facility] Discharge Instructions for Patients Receiving Chemotherapy  Today you received the following chemotherapy agents:  Nivolumab.  To help prevent nausea and vomiting after your treatment, we encourage you to take your nausea medication as directed.   If you develop nausea and vomiting that is not controlled by your nausea medication, call the clinic.   BELOW ARE SYMPTOMS THAT SHOULD BE REPORTED IMMEDIATELY:  *FEVER GREATER THAN 100.5 F  *CHILLS WITH OR WITHOUT FEVER  NAUSEA AND VOMITING THAT IS NOT CONTROLLED WITH YOUR NAUSEA MEDICATION  *UNUSUAL SHORTNESS OF BREATH  *UNUSUAL BRUISING OR BLEEDING  TENDERNESS IN MOUTH AND THROAT WITH OR WITHOUT PRESENCE OF ULCERS  *URINARY PROBLEMS  *BOWEL PROBLEMS  UNUSUAL RASH Items with * indicate a potential emergency and should be followed up as soon as possible.  Feel free to call the clinic you have any questions or concerns. The clinic phone number is [phone].  Please show the CHEMO ALERT CARD at check-in to the Emergency Department and triage nurse.   

## 2017-06-08 NOTE — Assessment & Plan Note (Signed)
His symptoms of intermittent chest wall pain is likely related to prior surgery.  The patient is reassured CT chest showed no evidence of disease progression I plan to repeat CT chest again next year

## 2017-07-06 ENCOUNTER — Other Ambulatory Visit: Payer: Self-pay | Admitting: Hematology and Oncology

## 2017-07-06 ENCOUNTER — Ambulatory Visit (HOSPITAL_BASED_OUTPATIENT_CLINIC_OR_DEPARTMENT_OTHER): Payer: Medicaid Other | Admitting: Hematology and Oncology

## 2017-07-06 ENCOUNTER — Telehealth: Payer: Self-pay | Admitting: Hematology and Oncology

## 2017-07-06 ENCOUNTER — Ambulatory Visit (HOSPITAL_BASED_OUTPATIENT_CLINIC_OR_DEPARTMENT_OTHER): Payer: Medicaid Other

## 2017-07-06 ENCOUNTER — Ambulatory Visit: Payer: Medicaid Other

## 2017-07-06 ENCOUNTER — Other Ambulatory Visit (HOSPITAL_BASED_OUTPATIENT_CLINIC_OR_DEPARTMENT_OTHER): Payer: Medicaid Other

## 2017-07-06 ENCOUNTER — Telehealth: Payer: Self-pay | Admitting: *Deleted

## 2017-07-06 DIAGNOSIS — C7800 Secondary malignant neoplasm of unspecified lung: Secondary | ICD-10-CM

## 2017-07-06 DIAGNOSIS — C7801 Secondary malignant neoplasm of right lung: Secondary | ICD-10-CM

## 2017-07-06 DIAGNOSIS — E039 Hypothyroidism, unspecified: Secondary | ICD-10-CM

## 2017-07-06 DIAGNOSIS — Z5111 Encounter for antineoplastic chemotherapy: Secondary | ICD-10-CM | POA: Diagnosis not present

## 2017-07-06 DIAGNOSIS — C760 Malignant neoplasm of head, face and neck: Secondary | ICD-10-CM | POA: Diagnosis not present

## 2017-07-06 DIAGNOSIS — Z23 Encounter for immunization: Secondary | ICD-10-CM | POA: Diagnosis not present

## 2017-07-06 DIAGNOSIS — C7989 Secondary malignant neoplasm of other specified sites: Secondary | ICD-10-CM | POA: Diagnosis not present

## 2017-07-06 DIAGNOSIS — Z95828 Presence of other vascular implants and grafts: Secondary | ICD-10-CM

## 2017-07-06 LAB — COMPREHENSIVE METABOLIC PANEL
ALBUMIN: 4.2 g/dL (ref 3.5–5.0)
ALT: 23 U/L (ref 0–55)
AST: 21 U/L (ref 5–34)
Alkaline Phosphatase: 49 U/L (ref 40–150)
Anion Gap: 11 mEq/L (ref 3–11)
BUN: 16.4 mg/dL (ref 7.0–26.0)
CHLORIDE: 104 meq/L (ref 98–109)
CO2: 24 meq/L (ref 22–29)
CREATININE: 1 mg/dL (ref 0.7–1.3)
Calcium: 9.5 mg/dL (ref 8.4–10.4)
EGFR: >90 mL/min/{1.73_m2} (ref >90)
Glucose: 151 mg/dl — ABNORMAL HIGH (ref 70–140)
Potassium: 3.7 mEq/L (ref 3.5–5.1)
SODIUM: 140 meq/L (ref 136–145)
TOTAL PROTEIN: 7.2 g/dL (ref 6.4–8.3)
Total Bilirubin: 0.37 mg/dL (ref 0.20–1.20)

## 2017-07-06 LAB — CBC WITH DIFFERENTIAL/PLATELET
BASO%: 1.2 % (ref 0.0–2.0)
BASOS ABS: 0.1 10*3/uL (ref 0.0–0.1)
EOS ABS: 0.1 10*3/uL (ref 0.0–0.5)
EOS%: 2.5 % (ref 0.0–7.0)
HCT: 44.7 % (ref 38.4–49.9)
HEMOGLOBIN: 15.3 g/dL (ref 13.0–17.1)
LYMPH%: 26 % (ref 14.0–49.0)
MCH: 30.8 pg (ref 27.2–33.4)
MCHC: 34.2 g/dL (ref 32.0–36.0)
MCV: 89.9 fL (ref 79.3–98.0)
MONO#: 0.3 10*3/uL (ref 0.1–0.9)
MONO%: 7.5 % (ref 0.0–14.0)
NEUT#: 2.8 10*3/uL (ref 1.5–6.5)
NEUT%: 62.8 % (ref 39.0–75.0)
PLATELETS: 177 10*3/uL (ref 140–400)
RBC: 4.97 10*6/uL (ref 4.20–5.82)
RDW: 13.9 % (ref 11.0–14.6)
WBC: 4.5 10*3/uL (ref 4.0–10.3)
lymph#: 1.2 10*3/uL (ref 0.9–3.3)

## 2017-07-06 LAB — TSH: TSH: 2.99 m[IU]/L (ref 0.320–4.118)

## 2017-07-06 MED ORDER — INFLUENZA VAC SPLIT QUAD 0.5 ML IM SUSY
0.5000 mL | PREFILLED_SYRINGE | Freq: Once | INTRAMUSCULAR | Status: AC
  Administered 2017-07-06: 0.5 mL via INTRAMUSCULAR
  Filled 2017-07-06: qty 0.5

## 2017-07-06 MED ORDER — SODIUM CHLORIDE 0.9 % IJ SOLN
10.0000 mL | INTRAMUSCULAR | Status: DC | PRN
  Administered 2017-07-06: 10 mL
  Filled 2017-07-06: qty 10

## 2017-07-06 MED ORDER — LEVOTHYROXINE SODIUM 100 MCG PO TABS: 100.0000 ug | ORAL_TABLET | Freq: Every day | ORAL | 9 refills | Status: DC

## 2017-07-06 MED ORDER — SODIUM CHLORIDE 0.9 % IV SOLN
Freq: Once | INTRAVENOUS | Status: AC
  Administered 2017-07-06: 11:00:00 via INTRAVENOUS

## 2017-07-06 MED ORDER — NIVOLUMAB CHEMO INJECTION 100 MG/10ML
240.0000 mg | Freq: Once | INTRAVENOUS | Status: AC
  Administered 2017-07-06: 240 mg via INTRAVENOUS
  Filled 2017-07-06: qty 24

## 2017-07-06 MED ORDER — SODIUM CHLORIDE 0.9% FLUSH
10.0000 mL | Freq: Once | INTRAVENOUS | Status: AC
  Administered 2017-07-06: 10 mL
  Filled 2017-07-06: qty 10

## 2017-07-06 MED ORDER — DICLOFENAC SODIUM 50 MG PO TBEC: 50.0000 mg | DELAYED_RELEASE_TABLET | Freq: Three times a day (TID) | ORAL | 1 refills | Status: DC | PRN

## 2017-07-06 MED ORDER — ANTICOAGULANT SODIUM CITRATE 4% (200MG/5ML) IV SOLN
5.0000 mL | Freq: Once | Status: AC
  Administered 2017-07-06: 5 mL via INTRAVENOUS
  Filled 2017-07-06: qty 5

## 2017-07-06 NOTE — Patient Instructions (Addendum)
[facility] Discharge Instructions for Patients Receiving Chemotherapy  Today you received the following chemotherapy agents:  Nivolumab.  To help prevent nausea and vomiting after your treatment, we encourage you to take your nausea medication as directed.   If you develop nausea and vomiting that is not controlled by your nausea medication, call the clinic.   BELOW ARE SYMPTOMS THAT SHOULD BE REPORTED IMMEDIATELY:  *FEVER GREATER THAN 100.5 F  *CHILLS WITH OR WITHOUT FEVER  NAUSEA AND VOMITING THAT IS NOT CONTROLLED WITH YOUR NAUSEA MEDICATION  *UNUSUAL SHORTNESS OF BREATH  *UNUSUAL BRUISING OR BLEEDING  TENDERNESS IN MOUTH AND THROAT WITH OR WITHOUT PRESENCE OF ULCERS  *URINARY PROBLEMS  *BOWEL PROBLEMS  UNUSUAL RASH Items with * indicate a potential emergency and should be followed up as soon as possible.  Feel free to call the clinic you have any questions or concerns. The clinic phone number is [phone].  Please show the CHEMO ALERT CARD at check-in to the Emergency Department and triage nurse.   

## 2017-07-06 NOTE — Telephone Encounter (Signed)
Scheduled appt per 10/2 los - patient to get new schedule in the treatment area.

## 2017-07-06 NOTE — Telephone Encounter (Signed)
"  I'm running late, was supposed to be there at 9:00 am.  I ned to talk with Dr.Gorsuch nurse to let her know I'm on my way and will be there within ten minutes."  Call transferred.  First appointment start time was 0815 for lab/flush.Courtney Paris.

## 2017-07-07 ENCOUNTER — Encounter: Payer: Self-pay | Admitting: Hematology and Oncology

## 2017-07-07 NOTE — Progress Notes (Signed)
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 Dates of therapy are approximates only due to poor records       Primary cancer of head and neck (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 Pan-endoscopy elsewhere showed cancer from nasopharyngeal space.      01/04/2007 - 02/20/2007 Chemotherapy    He received 2 cycles of cisplatin and 5FU followed by concurrent chemo with weekly cisplatin and radiation. He only received 2 doses of chemo due to severe mucositis, nausea and weight loss.      04/05/2007 - 08/04/2007 Chemotherapy    He received 4 more courses of cisplatin with 5FU and had complete response      07/05/2009 Procedure    Fine-needle aspirate of the right level II lymph nodes come from recurrent metastatic disease. Repeat endoscopy and CT scan show no evidence of disease elsewhere.      07/08/2009 - 12/02/2009 Chemotherapy    He was given 6 cycles of carboplatin, 5-FU and docetaxel      12/03/2009 Surgery    He has surgery to the residual lymph node on the right neck which showed no evidence of disease.      02/22/2012 Imaging    Repeat imaging study showed large recurrent mass. He was referred  elsewhere for further treatment.      05/03/2012 Surgery    He underwent left upper lobectomy.      04/29/2013 Imaging    PEt scan showed lesion on right level II B and lower lung was abnormal      06/03/2013 - 02/02/2014 Chemotherapy    He had 6 cycles of chemotherapy when he was found to have recurrence of cancer and had received oxaliplatin and capecitabine      06/07/2014 Imaging    PET CT scan showed persistent disease in the right neck lymph nodes and left lung      06/29/2014 Procedure    Accession: [id number] repeat LUL biopsy confirmed metastatic cancer      07/18/2014 - 07/31/2014 Radiation Therapy    He received palliative radiation therapy to the lungs      10/10/2014 Imaging    CT scan of the chest, abdomen and pelvis show regression in the size of the lung nodule in the left upper lobe and stable pulmonary nodules      01/24/2015 Imaging    CT scan showed stable disease in neck and lung      06/19/2015 Imaging    CT scan of the neck and the chest show possible mild progression of the nodule in the right side of the neck.      06/25/2015 Imaging    PET scan confirmed disease recurrence in the neck      07/07/2015  Imaging    He had MRI neck at [facility]      09/03/2015 -  Chemotherapy    He received palliative chemo with Nivolumab      10/29/2015 Imaging    PET CT showed positive response to Rx      02/28/2016 Imaging    Ct abdomen showed abnormal thinkening in his stomach      03/03/2016 Imaging    CT: Right sternocleidomastoid muscle metastasis appears less distinct but otherwise not significantly changed in size or configuration since 06/19/2015.2. Left level 3 lymph node which was hypermetabolic by PET-CT in January 2017 appears slightly smaller      04/01/2016 Imaging    CT cervical spine showed no acute fracture or traumatic malalignment in the cervical spine      04/22/2016 Procedure    Port-a-cath placed.      06/16/2016 Imaging    Ct neck showed  right sternocleidomastoid muscle metastasis is further decreased in conspicuity since May, and has mildly decreased in size since September 2016. Continued stability of sub-centimeter left cervical lymph nodes. No new or progressive metastatic disease in the neck.      06/16/2016 Imaging    CT chest showed stable masslike radiation fibrosis in the left upper lobe. Stable subcentimeter pulmonary nodules in the bilateral lower lobes. No new or progressive metastatic disease in the chest. Nonobstructing left renal stone.      10/13/2016 Imaging    Ct neck showed unchanged right sternocleidomastoid muscle metastasis. Unchanged subcentimeter left cervical lymph nodes. No evidence of new or progressive metastatic disease in the neck.      10/13/2016 Imaging    CT chest showed tiny hypervascular foci in the liver, not definitely seen on prior imaging of 06/16/2016 and 02/28/2016. Abdomen MRI without and with contrast recommended to further evaluate as metastatic disease is a concern. 2. Stable appearance of post treatment changes left upper lung and scattered tiny bilateral pulmonary nodules.      02/11/2017 Imaging    Ct neck: Lymph node mass right posterior neck appears improved from the prior study. Small posterior lymph nodes on the left unchanged. Occluded right jugular vein unchanged.      02/11/2017 Imaging    1. Similar appearance of postsurgical and radiation changes in the left upper lobe. 2. Similar bilateral pulmonary nodules. 3. No thoracic adenopathy. 4. Subtle foci of post-contrast enhancement within the liver are suboptimally characterized on this nondedicated study. Likely similar. These could either be re-evaluated at followup or more entirely characterized with abdominal MRI. 5. Left nephrolithiasis.      05/19/2017 Imaging    Matted lymph node mass right posterior neck appears larger in the recent CT. Accurate measurements difficult due to infiltrating tumor margins and  infiltration of the muscle. Right jugular vein again appears occluded or resected. Small left posterior lymph nodes stable. Left upper lobe airspace density stable and similar to the prior CT      06/03/2017 PET scan    1. Hypermetabolic ill-defined right level IIb lymph node, about 1.3 cm in diameter with maximum SUV 9.5 (formerly 8.1). Appearance suspicious for residual/recurrent malignancy. No worrisome left-sided lesion. 2. Left suprahilar indistinct opacity demonstrates no worrisome hypermetabolic activity. The 5 mm left lower lobe pulmonary nodule is stable and not currently hypermetabolic although below sensitive PET-CT size thresholds. 3. Other imaging findings of potential clinical significance: Bilateral nonobstructive nephrolithiasis. Chronic bilateral maxillary sinusitis.       INTERVAL HISTORY: Please see below for problem oriented  charting. He returns for chemotherapy and follow-up He denies significant new neck pain No recent cough, chest pain or shortness of breath He is compliant taking his thyroid medicine as prescribed  REVIEW OF SYSTEMS:   Constitutional: Denies fevers, chills or abnormal weight loss Eyes: Denies blurriness of vision Ears, nose, mouth, throat, and face: Denies mucositis or sore throat Respiratory: Denies cough, dyspnea or wheezes Cardiovascular: Denies palpitation, chest discomfort or lower extremity swelling Gastrointestinal:  Denies nausea, heartburn or change in bowel habits Skin: Denies abnormal skin rashes Lymphatics: Denies new lymphadenopathy or easy bruising Neurological:Denies numbness, tingling or new weaknesses Behavioral/Psych: Mood is stable, no new changes  All other systems were reviewed with the patient and are negative.  I have reviewed the past medical history, past surgical history, social history and family history with the patient and they are unchanged from previous note.  ALLERGIES:  is allergic to heparin.  MEDICATIONS:   Current Outpatient Prescriptions  Medication Sig Dispense Refill  . cyclobenzaprine (FLEXERIL) 5 MG tablet Take 1 tablet (5 mg total) by mouth 3 (three) times daily as needed for muscle spasms. 60 tablet 0  . diclofenac (VOLTAREN) 50 MG EC tablet Take 1 tablet (50 mg total) by mouth 3 (three) times daily as needed for mild pain. 60 tablet 1  . famotidine (PEPCID) 20 MG tablet Take 1 tablet (20 mg total) by mouth 2 (two) times daily. 30 tablet 0  . levothyroxine (SYNTHROID, LEVOTHROID) 100 MCG tablet Take 1 tablet (100 mcg total) by mouth daily before breakfast. 90 tablet 9  . lidocaine-prilocaine (EMLA) cream Apply 1 application topically as needed. (Patient taking differently: Apply 1 application topically as needed (for port access). ) 30 g 3  . meclizine (ANTIVERT) 25 MG tablet Take 1 tablet (25 mg total) by mouth 3 (three) times daily as needed for dizziness. 30 tablet 0  . oxyCODONE (ROXICODONE) 15 MG immediate release tablet Take 1 tablet (15 mg total) by mouth every 4 (four) hours as needed for pain. (Patient taking differently: Take 7.5-15 mg by mouth every 4 (four) hours as needed for pain. ) 60 tablet 0   No current facility-administered medications for this visit.    Facility-Administered Medications Ordered in Other Visits  Medication Dose Route Frequency Provider Last Rate Last Dose  . sodium chloride 0.9 % injection 10 mL  10 mL Intracatheter PRN Alvy Bimler, Jasmeet Gehl, MD        PHYSICAL EXAMINATION: ECOG PERFORMANCE STATUS: 1 - Symptomatic but completely ambulatory  Vitals:   07/06/17 1004  BP: 109/70  Pulse: 78  Resp: 16  Temp: 98.4 F (36.9 C)  SpO2: 100%   Filed Weights   07/06/17 1004  Weight: 175 lb 3.2 oz (79.5 kg)    GENERAL:alert, no distress and comfortable SKIN: skin color, texture, turgor are normal, no rashes or significant lesions EYES: normal, Conjunctiva are pink and non-injected, sclera clear OROPHARYNX:no exudate, no erythema and lips, buccal mucosa, and  tongue normal  NECK: He has significant neck fibrosis and postsurgical scar with no other abnormalities LYMPH:  no palpable lymphadenopathy in the cervical, axillary or inguinal LUNGS: clear to auscultation and percussion with normal breathing effort HEART: regular rate & rhythm and no murmurs and no lower extremity edema ABDOMEN:abdomen soft, non-tender and normal bowel sounds Musculoskeletal:no cyanosis of digits and no clubbing  NEURO: alert & oriented x 3 with fluent speech, no focal motor/sensory deficits  LABORATORY DATA:  I have reviewed the data as listed    Component  Value Date/Time   NA 140 07/06/2017 0921   K 3.7 07/06/2017 0921   CL 104 06/29/2014 0655   CO2 24 07/06/2017 0921   GLUCOSE 151 (H) 07/06/2017 0921   BUN 16.4 07/06/2017 0921   CREATININE 1.0 07/06/2017 0921   CALCIUM 9.5 07/06/2017 0921   PROT 7.2 07/06/2017 0921   ALBUMIN 4.2 07/06/2017 0921   AST 21 07/06/2017 0921   ALT 23 07/06/2017 0921   ALKPHOS 49 07/06/2017 0921   BILITOT 0.37 07/06/2017 0921   GFRNONAA >90 06/29/2014 0655   GFRAA >90 06/29/2014 0655    No results found for: SPEP, UPEP  Lab Results  Component Value Date   WBC 4.5 07/06/2017   NEUTROABS 2.8 07/06/2017   HGB 15.3 07/06/2017   HCT 44.7 07/06/2017   MCV 89.9 07/06/2017   PLT 177 07/06/2017      Chemistry      Component Value Date/Time   NA 140 07/06/2017 0921   K 3.7 07/06/2017 0921   CL 104 06/29/2014 0655   CO2 24 07/06/2017 0921   BUN 16.4 07/06/2017 0921   CREATININE 1.0 07/06/2017 0921      Component Value Date/Time   CALCIUM 9.5 07/06/2017 0921   ALKPHOS 49 07/06/2017 0921   AST 21 07/06/2017 0921   ALT 23 07/06/2017 0921   BILITOT 0.37 07/06/2017 0921      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[place]) He has intermittent right neck pain that comes and goes Clinically, his neck is thickened & woody from prior surgery and fibrosis from radiation Recent PET CT scan from 06/03/17 is reviewed  with the patient and in my eyes there is little clinical change over the past 20 months since his PET CT scan from January 2017 He was able to discontinue all pain medicine recently and felt fine Ultimately, we agreed to continue treatment as scheduled every month I will repeat imaging study again next year   Acquired hypothyroidism He will continue 100 MCG of levothyroxine for now Repeat TSH is stable   No orders of the defined types were placed in this encounter.  All questions were answered. The patient knows to call the clinic with any problems, questions or concerns. No barriers to learning was detected. I spent 15 minutes counseling the patient face to face. The total time spent in the appointment was 20 minutes and more than 50% was on counseling and review of test results     [name], MD 07/07/2017 9:39 AM

## 2017-07-07 NOTE — Assessment & Plan Note (Signed)
He will continue 100 MCG of levothyroxine for now Repeat TSH is stable

## 2017-07-07 NOTE — Assessment & Plan Note (Signed)
He has intermittent right neck pain that comes and goes Clinically, his neck is thickened & woody from prior surgery and fibrosis from radiation Recent PET CT scan from 06/03/17 is reviewed with the patient and in my eyes there is little clinical change over the past 20 months since his PET CT scan from January 2017 He was able to discontinue all pain medicine recently and felt fine Ultimately, we agreed to continue treatment as scheduled every month I will repeat imaging study again next year

## 2017-07-12 ENCOUNTER — Telehealth: Payer: Self-pay | Admitting: Hematology and Oncology

## 2017-07-12 NOTE — Telephone Encounter (Signed)
Called patient regarding 10/30

## 2017-07-19 ENCOUNTER — Telehealth: Payer: Self-pay | Admitting: *Deleted

## 2017-07-19 MED ORDER — OXYCODONE HCL 15 MG PO TABS: 15.0000 mg | ORAL_TABLET | ORAL | 0 refills | Status: DC | PRN

## 2017-07-19 NOTE — Telephone Encounter (Signed)
Pt called for refill of oxycodone

## 2017-07-31 ENCOUNTER — Emergency Department (HOSPITAL_COMMUNITY): Payer: Medicaid Other

## 2017-07-31 ENCOUNTER — Emergency Department (HOSPITAL_COMMUNITY)
Admission: EM | Admit: 2017-07-31 | Discharge: 2017-07-31 | Disposition: A | Discharge status: Home / Self Care | Payer: Medicaid Other | Attending: Emergency Medicine | Admitting: Emergency Medicine

## 2017-07-31 ENCOUNTER — Encounter (HOSPITAL_COMMUNITY): Payer: Self-pay | Admitting: Emergency Medicine

## 2017-07-31 DIAGNOSIS — E039 Hypothyroidism, unspecified: Secondary | ICD-10-CM | POA: Insufficient documentation

## 2017-07-31 DIAGNOSIS — M79661 Pain in right lower leg: Secondary | ICD-10-CM | POA: Diagnosis present

## 2017-07-31 DIAGNOSIS — Z79899 Other long term (current) drug therapy: Secondary | ICD-10-CM | POA: Diagnosis not present

## 2017-07-31 MED ORDER — NAPROXEN 500 MG PO TABS: 500.0000 mg | ORAL_TABLET | Freq: Two times a day (BID) | ORAL | 0 refills | Status: DC | PRN

## 2017-07-31 NOTE — Discharge Instructions (Signed)
It was my pleasure taking care of you today!  Naproxen as needed for pain.   Ice and elevate affected area for pain and swelling relief (instructions below).   If pain is not improved in 1 week, please call the orthopedic clinic listed to arrange a follow up appointment.   Return to ER for new or worsening symptoms, any additional concerns.   COLD THERAPY DIRECTIONS:  Ice or gel packs can be used to reduce both pain and swelling. Ice is the most helpful within the first 24 to 48 hours after an injury or flareup from overusing a muscle or joint.  Ice is effective, has very few side effects, and is safe for most people to use.   If you expose your skin to cold temperatures for too long or without the proper protection, you can damage your skin or nerves. Watch for signs of skin damage due to cold.   HOME CARE INSTRUCTIONS  Follow these tips to use ice and cold packs safely.  Place a dry or damp towel between the ice and skin. A damp towel will cool the skin more quickly, so you may need to shorten the time that the ice is used.  For a more rapid response, add gentle compression to the ice.  Ice for no more than 10 to 20 minutes at a time. The bonier the area you are icing, the less time it will take to get the benefits of ice.  Check your skin after 5 minutes to make sure there are no signs of a poor response to cold or skin damage.  Rest 20 minutes or more in between uses.  Once your skin is numb, you can end your treatment. You can test numbness by very lightly touching your skin. The touch should be so light that you do not see the skin dimple from the pressure of your fingertip. When using ice, most people will feel these normal sensations in this order: cold, burning, aching, and numbness.

## 2017-07-31 NOTE — ED Provider Notes (Signed)
[facility] DEPT Provider Note   CSN: [id number] Arrival date & time: 07/31/17  1519     History   Chief Complaint Chief Complaint  Patient presents with  . Ankle Pain  . Leg Pain    HPI Christian Simmons is a 31 y.o. male.  The history is provided by the patient and medical records. No language interpreter was used.   Christian Simmons is a 31 y.o. male  with a PMH as listed below who presents to the Emergency Department complaining of right lower leg pain which began just prior to arrival. Patient states that he was at work walking when he stepped on something on the ground, causing him to slip and twist his right ankle, then hit the lateral aspect of his right leg. No medications taken prior to arrival for symptoms. Pain worse with movement and ambulation, better when still. No numbness, tingling. No open wounds. Denies history of prior injuries to RLE.   Past Medical History:  Diagnosis Date  . Arrhythmia 12/25/2015  . Carcinoma ([place])   . Fatigue 06/01/2014  . Hypothyroidism   . Infection of eyelash follicle of left eye 04/06/5008  . 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 Nasopharyngeal cancer ([place]) 06/01/2014  . Neuropathy   . Radiation 07/18/14-07/31/14   Left upper lobe  40 gy in 10 fractions  . 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 Medically noncompliant 04/13/2017  . Vertigo, intermittent 11/02/2016  . Port catheter in place 04/01/2016  . Epigastric pain 02/10/2016  . Gastritis without bleeding 01/22/2016  . Arrhythmia 12/25/2015  . Infection of eyelash follicle of left eye 38/18/2993  . Poor dentition 10/16/2015  . Other constipation 05/16/2015  . Neck pain on right side 01/28/2015  . 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 Acquired hypothyroidism 06/09/2014  . Primary cancer of head and neck ([place]) 06/01/2014  . 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  Chronic fatigue 06/01/2014    Past Surgical History:  Procedure Laterality Date  . LUNG REMOVAL, PARTIAL  05/03/2012   left upper lobectomy  . nasal biopsy    . RADICAL NECK DISSECTION    . VIDEO BRONCHOSCOPY N/A 06/29/2014   Procedure: VIDEO BRONCHOSCOPY ;  Surgeon: Melrose Nakayama, MD;  Location: [facility];  Service: Thoracic;  Laterality: N/A;       Home Medications    Prior to Admission medications   Medication Sig Start Date End Date Taking? Authorizing Provider  cyclobenzaprine (FLEXERIL) 5 MG tablet Take 1 tablet (5 mg total) by mouth 3 (three) times daily as needed for muscle spasms. 05/10/17   [name], MD  diclofenac (VOLTAREN) 50 MG EC tablet Take 1 tablet (50 mg total) by mouth 3 (three) times daily as needed for mild pain. 07/06/17   [name], MD  famotidine (PEPCID) 20 MG tablet Take 1 tablet (20 mg total) by mouth 2 (two) times daily. 05/26/17   Fredia Sorrow, MD  levothyroxine (SYNTHROID, LEVOTHROID) 100 MCG tablet Take 1 tablet (100 mcg total) by mouth daily before breakfast. 07/06/17   [name], MD  lidocaine-prilocaine (EMLA) cream Apply 1 application topically as needed. Patient taking differently: Apply 1 application topically as needed (for port access).  10/08/16   [name], MD  meclizine (ANTIVERT) 25 MG tablet Take 1 tablet (25 mg total) by mouth 3 (three) times daily as needed for dizziness.  01/18/17   [name], MD  naproxen (NAPROSYN) 500 MG tablet Take 1 tablet (500 mg total) by mouth 2 (two) times daily as needed. 07/31/17   Nikolas Casher, Ozella Almond, PA-C  oxyCODONE (ROXICODONE) 15 MG immediate release tablet Take 1 tablet (15 mg total) by mouth every 4 (four) hours as needed for pain. 07/19/17   [name], MD    Family History Family History  Problem Relation Age of Onset  . Hypertension Mother   . Diabetes Mother   . Hypertension Father   . Diabetes Father     Social History Social History  Substance Use Topics  . Smoking status: Never  Smoker  . Smokeless tobacco: Never Used  . Alcohol use No     Allergies   Heparin   Review of Systems Review of Systems  Musculoskeletal: Positive for arthralgias and myalgias.  Skin: Negative for wound.  Neurological: Negative for weakness and numbness.     Physical Exam Updated Vital Signs BP 129/89 (BP Location: Right Arm)   Pulse 76   Temp 98 F (36.7 C) (Oral)   Resp 18   SpO2 100%   Physical Exam  Constitutional: He appears well-developed and well-nourished. No distress.  HENT:  Head: Normocephalic and atraumatic.  Neck: Neck supple.  Cardiovascular: Normal rate, regular rhythm and normal heart sounds.   No murmur heard. Pulmonary/Chest: Effort normal and breath sounds normal. No respiratory distress. He has no wheezes. He has no rales.  Musculoskeletal:       Legs: Tenderness to palpation as depicted in image with mild overlying swelling. No tenderness to palpation of malleoli or forefoot. Achilles intact. No open wounds. Full ROM. NVI.   Neurological: He is alert.  Skin: Skin is warm and dry.  Nursing note and vitals reviewed.    ED Treatments / Results  Labs (all labs ordered are listed, but only abnormal results are displayed) Labs Reviewed - No data to display  EKG  EKG Interpretation None       Radiology Dg Tibia/fibula Right  Result Date: 07/31/2017 CLINICAL DATA:  Tripped and twisted the right ankle with pain distally EXAM: RIGHT TIBIA AND FIBULA - 2 VIEW COMPARISON:  None. FINDINGS: There is no evidence of fracture or other focal bone lesions. Soft tissues are unremarkable. IMPRESSION: Negative. Electronically Signed   By: Donavan Foil M.D.   On: 07/31/2017 16:38   Dg Ankle Complete Right  Result Date: 07/31/2017 CLINICAL DATA:  Tripped and twisted the right ankle EXAM: RIGHT ANKLE - COMPLETE 3+ VIEW COMPARISON:  None. FINDINGS: There is no evidence of fracture, dislocation, or joint effusion. There is no evidence of arthropathy or  other focal bone abnormality. Soft tissues are unremarkable. IMPRESSION: Negative. Electronically Signed   By: Donavan Foil M.D.   On: 07/31/2017 16:37    Procedures Procedures (including critical care time)  Medications Ordered in ED Medications - No data to display   Initial Impression / Assessment and Plan / ED Course  I have reviewed the triage vital signs and the nursing notes.  Pertinent labs & imaging results that were available during my care of the patient were reviewed by me and considered in my medical decision making (see chart for details).    Christian Simmons is a 31 y.o. male who presents to ED for right ankle and right lower leg pain after tripping just prior to arrival. NVI on exam. X-ray negative. No open wounds. Symptomatic home care instructions discussed. Ortho follow up if improvement.  Reasons to return to ER discussed and all questions answered.    Final Clinical Impressions(s) / ED Diagnoses   Final diagnoses:  Pain in right lower leg    New Prescriptions New Prescriptions   NAPROXEN (NAPROSYN) 500 MG TABLET    Take 1 tablet (500 mg total) by mouth 2 (two) times daily as needed.     Talley Casco, Ozella Almond, PA-C 07/31/17 [place], MD 07/31/17 [phone]

## 2017-07-31 NOTE — ED Triage Notes (Signed)
Per patient states he tripped and twisted his right ankle and lower leg-

## 2017-08-03 ENCOUNTER — Other Ambulatory Visit: Payer: Medicaid Other

## 2017-08-03 ENCOUNTER — Ambulatory Visit: Payer: Medicaid Other | Admitting: Hematology and Oncology

## 2017-08-03 ENCOUNTER — Ambulatory Visit: Payer: Medicaid Other

## 2017-08-03 ENCOUNTER — Telehealth: Payer: Self-pay | Admitting: *Deleted

## 2017-08-03 NOTE — Telephone Encounter (Signed)
Attempted to call regarding appts. Voice mail box was full.

## 2017-08-04 ENCOUNTER — Telehealth: Payer: Self-pay | Admitting: Hematology and Oncology

## 2017-08-04 NOTE — Telephone Encounter (Signed)
Scheduled appt per 10/29 sch msg. Left message for patient regarding this information. Sending confirmation letter in the mail.

## 2017-08-18 ENCOUNTER — Other Ambulatory Visit (HOSPITAL_BASED_OUTPATIENT_CLINIC_OR_DEPARTMENT_OTHER): Payer: Medicaid Other

## 2017-08-18 ENCOUNTER — Other Ambulatory Visit: Payer: Self-pay | Admitting: Hematology and Oncology

## 2017-08-18 ENCOUNTER — Ambulatory Visit: Payer: Medicaid Other

## 2017-08-18 ENCOUNTER — Ambulatory Visit (HOSPITAL_BASED_OUTPATIENT_CLINIC_OR_DEPARTMENT_OTHER): Payer: Medicaid Other | Admitting: Hematology and Oncology

## 2017-08-18 ENCOUNTER — Ambulatory Visit (HOSPITAL_BASED_OUTPATIENT_CLINIC_OR_DEPARTMENT_OTHER): Payer: Medicaid Other

## 2017-08-18 ENCOUNTER — Encounter: Payer: Self-pay | Admitting: Hematology and Oncology

## 2017-08-18 DIAGNOSIS — C7989 Secondary malignant neoplasm of other specified sites: Secondary | ICD-10-CM

## 2017-08-18 DIAGNOSIS — C760 Malignant neoplasm of head, face and neck: Secondary | ICD-10-CM

## 2017-08-18 DIAGNOSIS — Z5112 Encounter for antineoplastic immunotherapy: Secondary | ICD-10-CM

## 2017-08-18 DIAGNOSIS — M542 Cervicalgia: Secondary | ICD-10-CM | POA: Diagnosis not present

## 2017-08-18 DIAGNOSIS — E039 Hypothyroidism, unspecified: Secondary | ICD-10-CM | POA: Diagnosis not present

## 2017-08-18 DIAGNOSIS — Z95828 Presence of other vascular implants and grafts: Secondary | ICD-10-CM

## 2017-08-18 DIAGNOSIS — C7801 Secondary malignant neoplasm of right lung: Secondary | ICD-10-CM

## 2017-08-18 DIAGNOSIS — C7800 Secondary malignant neoplasm of unspecified lung: Secondary | ICD-10-CM

## 2017-08-18 LAB — CBC WITH DIFFERENTIAL/PLATELET
BASO%: 0.2 % (ref 0.0–2.0)
BASOS ABS: 0 10*3/uL (ref 0.0–0.1)
EOS ABS: 0.1 10*3/uL (ref 0.0–0.5)
EOS%: 2 % (ref 0.0–7.0)
HCT: 42.7 % (ref 38.4–49.9)
HGB: 14.5 g/dL (ref 13.0–17.1)
LYMPH%: 26.5 % (ref 14.0–49.0)
MCH: 30.3 pg (ref 27.2–33.4)
MCHC: 34 g/dL (ref 32.0–36.0)
MCV: 89.3 fL (ref 79.3–98.0)
MONO#: 0.4 10*3/uL (ref 0.1–0.9)
MONO%: 7.5 % (ref 0.0–14.0)
NEUT#: 3.3 10*3/uL (ref 1.5–6.5)
NEUT%: 63.8 % (ref 39.0–75.0)
Platelets: 216 10*3/uL (ref 140–400)
RBC: 4.78 10*6/uL (ref 4.20–5.82)
RDW: 13.5 % (ref 11.0–14.6)
WBC: 5.1 10*3/uL (ref 4.0–10.3)
lymph#: 1.4 10*3/uL (ref 0.9–3.3)

## 2017-08-18 LAB — COMPREHENSIVE METABOLIC PANEL
ALBUMIN: 4.1 g/dL (ref 3.5–5.0)
ALK PHOS: 64 U/L (ref 40–150)
ALT: 37 U/L (ref 0–55)
AST: 25 U/L (ref 5–34)
Anion Gap: 10 mEq/L (ref 3–11)
BUN: 15.9 mg/dL (ref 7.0–26.0)
CO2: 26 meq/L (ref 22–29)
Calcium: 9.3 mg/dL (ref 8.4–10.4)
Chloride: 104 mEq/L (ref 98–109)
Creatinine: 1 mg/dL (ref 0.7–1.3)
GLUCOSE: 141 mg/dL — AB (ref 70–140)
POTASSIUM: 3.7 meq/L (ref 3.5–5.1)
SODIUM: 139 meq/L (ref 136–145)
Total Bilirubin: 0.42 mg/dL (ref 0.20–1.20)
Total Protein: 7.1 g/dL (ref 6.4–8.3)

## 2017-08-18 LAB — TSH: TSH: 5.329 m(IU)/L — ABNORMAL HIGH (ref 0.320–4.118)

## 2017-08-18 MED ORDER — ANTICOAGULANT SODIUM CITRATE 4% (200MG/5ML) IV SOLN
5.0000 mL | Freq: Once | Status: AC
  Administered 2017-08-18: 5 mL via INTRAVENOUS
  Filled 2017-08-18: qty 5

## 2017-08-18 MED ORDER — SODIUM CHLORIDE 0.9 % IV SOLN
Freq: Once | INTRAVENOUS | Status: AC
  Administered 2017-08-18: 10:00:00 via INTRAVENOUS

## 2017-08-18 MED ORDER — OXYCODONE HCL 15 MG PO TABS: 15.0000 mg | ORAL_TABLET | ORAL | 0 refills | Status: DC | PRN

## 2017-08-18 MED ORDER — SODIUM CHLORIDE 0.9 % IV SOLN
240.0000 mg | Freq: Once | INTRAVENOUS | Status: AC
  Administered 2017-08-18: 240 mg via INTRAVENOUS
  Filled 2017-08-18: qty 24

## 2017-08-18 MED ORDER — LEVOTHYROXINE SODIUM 125 MCG PO TABS: 125.0000 ug | ORAL_TABLET | Freq: Every day | ORAL | 9 refills | Status: DC

## 2017-08-18 MED ORDER — SODIUM CHLORIDE 0.9% FLUSH
10.0000 mL | Freq: Once | INTRAVENOUS | Status: AC
  Administered 2017-08-18: 10 mL
  Filled 2017-08-18: qty 10

## 2017-08-18 MED ORDER — SODIUM CHLORIDE 0.9 % IJ SOLN
10.0000 mL | INTRAMUSCULAR | Status: DC | PRN
  Administered 2017-08-18: 10 mL
  Filled 2017-08-18: qty 10

## 2017-08-18 NOTE — Assessment & Plan Note (Signed)
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

## 2017-08-18 NOTE — Assessment & Plan Note (Signed)
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 I recommend continue conservative management with physical therapy, topical lidocaine, muscle relaxant and pain medicine as needed

## 2017-08-18 NOTE — Patient Instructions (Signed)
[facility] Discharge Instructions for Patients Receiving Chemotherapy  Today you received the following chemotherapy agents: Nivolumab (OPDIVO).  To help prevent nausea and vomiting after your treatment, we encourage you to take your nausea medication as directed.    If you develop nausea and vomiting that is not controlled by your nausea medication, call the clinic.   BELOW ARE SYMPTOMS THAT SHOULD BE REPORTED IMMEDIATELY:  *FEVER GREATER THAN 100.5 F  *CHILLS WITH OR WITHOUT FEVER  NAUSEA AND VOMITING THAT IS NOT CONTROLLED WITH YOUR NAUSEA MEDICATION  *UNUSUAL SHORTNESS OF BREATH  *UNUSUAL BRUISING OR BLEEDING  TENDERNESS IN MOUTH AND THROAT WITH OR WITHOUT PRESENCE OF ULCERS  *URINARY PROBLEMS  *BOWEL PROBLEMS  UNUSUAL RASH Items with * indicate a potential emergency and should be followed up as soon as possible.  Feel free to call the clinic you have any questions or concerns. The clinic phone number is [phone].  Please show the [place] at check-in to the Emergency Department and triage nurse.

## 2017-08-18 NOTE — Progress Notes (Signed)
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 Pan-endoscopy elsewhere showed cancer from nasopharyngeal space.      01/04/2007 - 02/20/2007 Chemotherapy    He received 2 cycles of cisplatin and 5FU followed by concurrent chemo with weekly cisplatin and radiation. He only received 2 doses of chemo due to severe mucositis, nausea and weight loss.      04/05/2007 - 08/04/2007 Chemotherapy    He received 4 more courses of cisplatin with 5FU and had complete response      07/05/2009 Procedure    Fine-needle aspirate of the right level II lymph nodes come from recurrent metastatic disease. Repeat endoscopy and CT scan show no evidence of disease elsewhere.      07/08/2009 - 12/02/2009 Chemotherapy    He was given 6 cycles of carboplatin, 5-FU and docetaxel      12/03/2009 Surgery    He has surgery to the residual lymph node on the right neck which showed no evidence of disease.      02/22/2012 Imaging    Repeat imaging study showed large recurrent mass. He was referred  elsewhere for further treatment.      05/03/2012 Surgery    He underwent left upper lobectomy.      04/29/2013 Imaging    PEt scan showed lesion on right level II B and lower lung was abnormal      06/03/2013 - 02/02/2014 Chemotherapy    He had 6 cycles of chemotherapy when he was found to have recurrence of cancer and had received oxaliplatin and capecitabine      06/07/2014 Imaging    PET CT scan showed persistent disease in the right neck lymph nodes and left lung      06/29/2014 Procedure    Accession: [id number] repeat LUL biopsy confirmed metastatic cancer      07/18/2014 - 07/31/2014 Radiation Therapy    He received palliative radiation therapy to the lungs      10/10/2014 Imaging    CT scan of the chest, abdomen and pelvis show regression in the size of the lung nodule in the left upper lobe and stable pulmonary nodules      01/24/2015 Imaging    CT scan showed stable disease in neck and lung      06/19/2015 Imaging    CT scan of the neck and the chest show possible mild progression of the nodule in the right side of the neck.      06/25/2015 Imaging    PET scan confirmed disease recurrence in the neck      07/07/2015  Imaging    He had MRI neck at [facility]      09/03/2015 -  Chemotherapy    He received palliative chemo with Nivolumab      10/29/2015 Imaging    PET CT showed positive response to Rx      02/28/2016 Imaging    Ct abdomen showed abnormal thinkening in his stomach      03/03/2016 Imaging    CT: Right sternocleidomastoid muscle metastasis appears less distinct but otherwise not significantly changed in size or configuration since 06/19/2015.2. Left level 3 lymph node which was hypermetabolic by PET-CT in January 2017 appears slightly smaller      04/01/2016 Imaging    CT cervical spine showed no acute fracture or traumatic malalignment in the cervical spine      04/22/2016 Procedure    Port-a-cath placed.      06/16/2016 Imaging    Ct neck showed  right sternocleidomastoid muscle metastasis is further decreased in conspicuity since May, and has mildly decreased in size since September 2016. Continued stability of sub-centimeter left cervical lymph nodes. No new or progressive metastatic disease in the neck.      06/16/2016 Imaging    CT chest showed stable masslike radiation fibrosis in the left upper lobe. Stable subcentimeter pulmonary nodules in the bilateral lower lobes. No new or progressive metastatic disease in the chest. Nonobstructing left renal stone.      10/13/2016 Imaging    Ct neck showed unchanged right sternocleidomastoid muscle metastasis. Unchanged subcentimeter left cervical lymph nodes. No evidence of new or progressive metastatic disease in the neck.      10/13/2016 Imaging    CT chest showed tiny hypervascular foci in the liver, not definitely seen on prior imaging of 06/16/2016 and 02/28/2016. Abdomen MRI without and with contrast recommended to further evaluate as metastatic disease is a concern. 2. Stable appearance of post treatment changes left upper lung and scattered tiny bilateral pulmonary nodules.      02/11/2017 Imaging    Ct neck: Lymph node mass right posterior neck appears improved from the prior study. Small posterior lymph nodes on the left unchanged. Occluded right jugular vein unchanged.      02/11/2017 Imaging    1. Similar appearance of postsurgical and radiation changes in the left upper lobe. 2. Similar bilateral pulmonary nodules. 3. No thoracic adenopathy. 4. Subtle foci of post-contrast enhancement within the liver are suboptimally characterized on this nondedicated study. Likely similar. These could either be re-evaluated at followup or more entirely characterized with abdominal MRI. 5. Left nephrolithiasis.      05/19/2017 Imaging    Matted lymph node mass right posterior neck appears larger in the recent CT. Accurate measurements difficult due to infiltrating tumor margins and  infiltration of the muscle. Right jugular vein again appears occluded or resected. Small left posterior lymph nodes stable. Left upper lobe airspace density stable and similar to the prior CT      06/03/2017 PET scan    1. Hypermetabolic ill-defined right level IIb lymph node, about 1.3 cm in diameter with maximum SUV 9.5 (formerly 8.1). Appearance suspicious for residual/recurrent malignancy. No worrisome left-sided lesion. 2. Left suprahilar indistinct opacity demonstrates no worrisome hypermetabolic activity. The 5 mm left lower lobe pulmonary nodule is stable and not currently hypermetabolic although below sensitive PET-CT size thresholds. 3. Other imaging findings of potential clinical significance: Bilateral nonobstructive nephrolithiasis. Chronic bilateral maxillary sinusitis.       INTERVAL HISTORY: Please see below for problem oriented  charting. He returns for further follow-up He continued to have intermittent neck pain, slightly worse due to changes in the weather He is concerned about dependency on oxycodone He had mild constipation He complained of fatigue He denies cough, chest pain or shortness of breath  REVIEW OF SYSTEMS:   Constitutional: Denies fevers, chills or abnormal weight loss Eyes: Denies blurriness of vision Ears, nose, mouth, throat, and face: Denies mucositis or sore throat Respiratory: Denies cough, dyspnea or wheezes Cardiovascular: Denies palpitation, chest discomfort or lower extremity swelling Skin: Denies abnormal skin rashes Lymphatics: Denies new lymphadenopathy or easy bruising Neurological:Denies numbness, tingling or new weaknesses Behavioral/Psych: Mood is stable, no new changes  All other systems were reviewed with the patient and are negative.  I have reviewed the past medical history, past surgical history, social history and family history with the patient and they are unchanged from previous note.  ALLERGIES:  is allergic to  heparin.  MEDICATIONS:  Current Outpatient Medications  Medication Sig Dispense Refill  . cyclobenzaprine (FLEXERIL) 5 MG tablet Take 1 tablet (5 mg total) by mouth 3 (three) times daily as needed for muscle spasms. 60 tablet 0  . diclofenac (VOLTAREN) 50 MG EC tablet Take 1 tablet (50 mg total) by mouth 3 (three) times daily as needed for mild pain. 60 tablet 1  . famotidine (PEPCID) 20 MG tablet Take 1 tablet (20 mg total) by mouth 2 (two) times daily. 30 tablet 0  . levothyroxine (SYNTHROID, LEVOTHROID) 125 MCG tablet Take 1 tablet (125 mcg total) daily before breakfast by mouth. 30 tablet 9  . lidocaine-prilocaine (EMLA) cream Apply 1 application topically as needed. (Patient taking differently: Apply 1 application topically as needed (for port access). ) 30 g 3  . meclizine (ANTIVERT) 25 MG tablet Take 1 tablet (25 mg total) by mouth 3 (three) times daily as needed for dizziness. 30 tablet 0  . naproxen (NAPROSYN) 500 MG tablet Take 1 tablet (500 mg total) by mouth 2 (two) times daily as needed. 30 tablet 0  . oxyCODONE (ROXICODONE) 15 MG immediate release tablet Take 1 tablet (15 mg total) every 4 (four) hours as needed by mouth for pain. 60 tablet 0   No current facility-administered medications for this visit.    Facility-Administered Medications Ordered in Other Visits  Medication Dose Route Frequency Provider Last Rate Last Dose  . 0.9 %  sodium chloride infusion   Intravenous Once Alvy Bimler, Develle Sievers, MD      . anticoagulant sodium citrate solution 5 mL  5 mL Intravenous Once Alvy Bimler, Ifeoluwa Beller, MD      . nivolumab (OPDIVO) 240 mg in sodium chloride 0.9 % 100 mL chemo infusion  240 mg Intravenous Once Dhani Dannemiller, MD      . sodium chloride 0.9 % injection 10 mL  10 mL Intracatheter PRN Aleks Nawrot, MD      . sodium chloride 0.9 % injection 10 mL  10 mL Intracatheter PRN Alvy Bimler, Hayato Guaman, MD        PHYSICAL EXAMINATION: ECOG PERFORMANCE STATUS: 1 - Symptomatic but completely ambulatory  Vitals:    08/18/17 0906  BP: 122/69  Pulse: 70  Resp: 18  Temp: 97.8 F (36.6 C)  SpO2: 100%   Filed Weights   08/18/17 0906  Weight: 176 lb 1.6 oz (79.9 kg)    GENERAL:alert, no distress and comfortable SKIN: skin color, texture, turgor are normal, no rashes or significant lesions EYES: normal, Conjunctiva are pink and non-injected, sclera clear OROPHARYNX:no exudate, no erythema  and lips, buccal mucosa, and tongue normal  NECK: There is neck fibrosis.  Tenderness on mild palpation at the surgical scar area on the right, unchanged compared to prior exam LYMPH:  no palpable lymphadenopathy in the cervical, axillary or inguinal LUNGS: clear to auscultation and percussion with normal breathing effort HEART: regular rate & rhythm and no murmurs and no lower extremity edema ABDOMEN:abdomen soft, non-tender and normal bowel sounds Musculoskeletal:no cyanosis of digits and no clubbing  NEURO: alert & oriented x 3 with fluent speech, no focal motor/sensory deficits  LABORATORY DATA:  I have reviewed the data as listed    Component Value Date/Time   NA 139 08/18/2017 0843   K 3.7 08/18/2017 0843   CL 104 06/29/2014 0655   CO2 26 08/18/2017 0843   GLUCOSE 141 (H) 08/18/2017 0843   BUN 15.9 08/18/2017 0843   CREATININE 1.0 08/18/2017 0843   CALCIUM 9.3 08/18/2017 0843   PROT 7.1 08/18/2017 0843   ALBUMIN 4.1 08/18/2017 0843   AST 25 08/18/2017 0843   ALT 37 08/18/2017 0843   ALKPHOS 64 08/18/2017 0843   BILITOT 0.42 08/18/2017 0843   GFRNONAA >90 06/29/2014 0655   GFRAA >90 06/29/2014 0655    No results found for: SPEP, UPEP  Lab Results  Component Value Date   WBC 5.1 08/18/2017   NEUTROABS 3.3 08/18/2017   HGB 14.5 08/18/2017   HCT 42.7 08/18/2017   MCV 89.3 08/18/2017   PLT 216 08/18/2017      Chemistry      Component Value Date/Time   NA 139 08/18/2017 0843   K 3.7 08/18/2017 0843   CL 104 06/29/2014 0655   CO2 26 08/18/2017 0843   BUN 15.9 08/18/2017 0843    CREATININE 1.0 08/18/2017 0843      Component Value Date/Time   CALCIUM 9.3 08/18/2017 0843   ALKPHOS 64 08/18/2017 0843   AST 25 08/18/2017 0843   ALT 37 08/18/2017 0843   BILITOT 0.42 08/18/2017 0843       RADIOGRAPHIC STUDIES: I have personally reviewed the radiological images as listed and agreed with the findings in the report. Dg Tibia/fibula Right  Result Date: 07/31/2017 CLINICAL DATA:  Tripped and twisted the right ankle with pain distally EXAM: RIGHT TIBIA AND FIBULA - 2 VIEW COMPARISON:  None. FINDINGS: There is no evidence of fracture or other focal bone lesions. Soft tissues are unremarkable. IMPRESSION: Negative. Electronically Signed   By: Donavan Foil M.D.   On: 07/31/2017 16:38   Dg Ankle Complete Right  Result Date: 07/31/2017 CLINICAL DATA:  Tripped and twisted the right ankle EXAM: RIGHT ANKLE - COMPLETE 3+ VIEW COMPARISON:  None. FINDINGS: There is no evidence of fracture, dislocation, or joint effusion. There is no evidence of arthropathy or other focal bone abnormality. Soft tissues are unremarkable. IMPRESSION: Negative. Electronically Signed   By: Donavan Foil M.D.   On: 07/31/2017 16:37    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[place]) He has intermittent right neck pain that comes and goes Clinically, his neck is thickened & woody from prior surgery and fibrosis from radiation Recent PET CT scan from 06/03/17 is reviewed with the patient and in my eyes there is little clinical change over the past 20 months since his PET CT scan from January 2017 Ultimately, we agreed to continue treatment as scheduled every month I will repeat imaging study again next year   Acquired hypothyroidism He has abnormal TSH and has symptoms  of fatigue The patient has been compliant taking medications as directed I would increase the dose of levothyroxine a little bit  Neck pain on right side The patient has significant fibrosis and scar tissue from prior  surgery and radiation. I recommend continue conservative management with physical therapy, topical lidocaine, muscle relaxant and pain medicine as needed    No orders of the defined types were placed in this encounter.  All questions were answered. The patient knows to call the clinic with any problems, questions or concerns. No barriers to learning was detected. I spent 15 minutes counseling the patient face to face. The total time spent in the appointment was 20 minutes and more than 50% was on counseling and review of test results     [name], MD 08/18/2017 10:31 AM

## 2017-08-18 NOTE — Assessment & Plan Note (Addendum)
He has abnormal TSH and has symptoms of fatigue The patient has been compliant taking medications as directed I would increase the dose of levothyroxine a little bit

## 2017-09-03 ENCOUNTER — Other Ambulatory Visit: Payer: Self-pay | Admitting: Hematology and Oncology

## 2017-09-14 ENCOUNTER — Ambulatory Visit: Payer: Medicaid Other

## 2017-09-14 ENCOUNTER — Other Ambulatory Visit: Payer: Medicaid Other

## 2017-09-14 ENCOUNTER — Ambulatory Visit: Payer: Medicaid Other | Admitting: Hematology and Oncology

## 2017-09-16 ENCOUNTER — Telehealth: Payer: Self-pay | Admitting: Hematology and Oncology

## 2017-09-16 NOTE — Telephone Encounter (Signed)
Scheduled appt per 12/12 sch message - patient is aware of appt date and time.

## 2017-09-17 ENCOUNTER — Telehealth: Payer: Self-pay | Admitting: Hematology and Oncology

## 2017-09-17 NOTE — Telephone Encounter (Signed)
Called patient regarding 12/19

## 2017-09-22 ENCOUNTER — Other Ambulatory Visit (HOSPITAL_BASED_OUTPATIENT_CLINIC_OR_DEPARTMENT_OTHER): Payer: Medicaid Other

## 2017-09-22 ENCOUNTER — Ambulatory Visit (HOSPITAL_BASED_OUTPATIENT_CLINIC_OR_DEPARTMENT_OTHER): Payer: Medicaid Other

## 2017-09-22 ENCOUNTER — Ambulatory Visit (HOSPITAL_BASED_OUTPATIENT_CLINIC_OR_DEPARTMENT_OTHER): Payer: Medicaid Other | Admitting: Medical

## 2017-09-22 ENCOUNTER — Other Ambulatory Visit: Payer: Self-pay | Admitting: Hematology and Oncology

## 2017-09-22 VITALS — BP 127/79 | HR 80 | Temp 98.1°F | Resp 16 | Wt 179.4 lb

## 2017-09-22 DIAGNOSIS — K1230 Oral mucositis (ulcerative), unspecified: Secondary | ICD-10-CM

## 2017-09-22 DIAGNOSIS — K219 Gastro-esophageal reflux disease without esophagitis: Secondary | ICD-10-CM

## 2017-09-22 DIAGNOSIS — Z5112 Encounter for antineoplastic immunotherapy: Secondary | ICD-10-CM

## 2017-09-22 DIAGNOSIS — E039 Hypothyroidism, unspecified: Secondary | ICD-10-CM

## 2017-09-22 DIAGNOSIS — C760 Malignant neoplasm of head, face and neck: Secondary | ICD-10-CM

## 2017-09-22 DIAGNOSIS — C7800 Secondary malignant neoplasm of unspecified lung: Secondary | ICD-10-CM

## 2017-09-22 LAB — CBC WITH DIFFERENTIAL/PLATELET
BASO%: 0.4 % (ref 0.0–2.0)
BASOS ABS: 0 10*3/uL (ref 0.0–0.1)
EOS ABS: 0.1 10*3/uL (ref 0.0–0.5)
EOS%: 2.7 % (ref 0.0–7.0)
HCT: 41.2 % (ref 38.4–49.9)
HGB: 14.1 g/dL (ref 13.0–17.1)
LYMPH#: 1.4 10*3/uL (ref 0.9–3.3)
LYMPH%: 26.8 % (ref 14.0–49.0)
MCH: 30.9 pg (ref 27.2–33.4)
MCHC: 34.2 g/dL (ref 32.0–36.0)
MCV: 90.2 fL (ref 79.3–98.0)
MONO#: 0.6 10*3/uL (ref 0.1–0.9)
MONO%: 10.7 % (ref 0.0–14.0)
NEUT#: 3 10*3/uL (ref 1.5–6.5)
NEUT%: 59.4 % (ref 39.0–75.0)
Platelets: 176 10*3/uL (ref 140–400)
RBC: 4.57 10*6/uL (ref 4.20–5.82)
RDW: 13.5 % (ref 11.0–14.6)
WBC: 5.1 10*3/uL (ref 4.0–10.3)

## 2017-09-22 LAB — COMPREHENSIVE METABOLIC PANEL
ALT: 30 U/L (ref 0–55)
AST: 22 U/L (ref 5–34)
Albumin: 4.1 g/dL (ref 3.5–5.0)
Alkaline Phosphatase: 55 U/L (ref 40–150)
Anion Gap: 9 mEq/L (ref 3–11)
BUN: 14.1 mg/dL (ref 7.0–26.0)
CHLORIDE: 104 meq/L (ref 98–109)
CO2: 26 meq/L (ref 22–29)
CREATININE: 0.9 mg/dL (ref 0.7–1.3)
Calcium: 9.1 mg/dL (ref 8.4–10.4)
EGFR: >60 mL/min/{1.73_m2} (ref >60)
Glucose: 133 mg/dl (ref 70–140)
Potassium: 3.5 mEq/L (ref 3.5–5.1)
Sodium: 139 mEq/L (ref 136–145)
Total Bilirubin: 0.35 mg/dL (ref 0.20–1.20)
Total Protein: 6.8 g/dL (ref 6.4–8.3)

## 2017-09-22 LAB — TSH: TSH: 1.213 m(IU)/L (ref 0.320–4.118)

## 2017-09-22 MED ORDER — ANTICOAGULANT SODIUM CITRATE 4% (200MG/5ML) IV SOLN
5.0000 mL | Freq: Once | Status: AC
  Administered 2017-09-22: 5 mL via INTRAVENOUS
  Filled 2017-09-22: qty 5

## 2017-09-22 MED ORDER — SODIUM CHLORIDE 0.9 % IV SOLN
240.0000 mg | Freq: Once | INTRAVENOUS | Status: AC
  Administered 2017-09-22: 240 mg via INTRAVENOUS
  Filled 2017-09-22: qty 24

## 2017-09-22 MED ORDER — MAGIC MOUTHWASH: 5.0000 mL | Freq: Four times a day (QID) | ORAL | 5 refills | Status: DC | PRN

## 2017-09-22 MED ORDER — SODIUM CHLORIDE 0.9 % IV SOLN
Freq: Once | INTRAVENOUS | Status: AC
  Administered 2017-09-22: 09:00:00 via INTRAVENOUS

## 2017-09-22 MED ORDER — OMEPRAZOLE 40 MG PO CPDR: 40.0000 mg | DELAYED_RELEASE_CAPSULE | Freq: Every day | ORAL | 5 refills | Status: DC

## 2017-09-22 MED ORDER — LIDOCAINE VISCOUS 2 % MT SOLN: OROMUCOSAL | 3 refills | Status: DC

## 2017-09-22 MED ORDER — SODIUM CHLORIDE 0.9 % IJ SOLN
10.0000 mL | INTRAMUSCULAR | Status: DC | PRN
  Administered 2017-09-22: 10 mL
  Filled 2017-09-22: qty 10

## 2017-09-22 NOTE — Progress Notes (Signed)
Patient states he has a sore throat that started about a week ago. He states he feels like he has dry throat and mouth constantly. It is worse in the morning and while eating spicy foods. He has some redness laterally along his cheeks and lips. Patient has been using throat lozenges to soothe the dryness and pain.   Patient also states he has been experiencing and increase in acid reflux symptoms the past week. Patient has been drinking a glass of milk to help ease the indigestion with minimal relief. No changes noted in diet.   Left message for desk RN for Dr. Alvy Bimler

## 2017-09-22 NOTE — Progress Notes (Signed)
[facility] Progress Note   Christian Simmons [id number] May 08, 1986 31 y.o.  Christian Simmons is managed by Dr. [name]  Actively treated with chemotherapy: yes  Current Therapy:  Nivolumab  Last Treated:  09/22/2017  Assessment: Plan:    Gastroesophageal reflux disease, esophagitis presence not specified - Plan: omeprazole (PRILOSEC) 40 MG capsule  Mucositis - Plan: lidocaine (XYLOCAINE) 2 % solution, magic mouthwash SOLN   Gastroesophageal reflux disease: The patient was given a prescription for omeprazole 40 mg once daily.  Mucositis and oral irritation: Patient was given a prescription for Magic mouthwash and for viscous Xylocaine to be  used as needed for oral tenderness.  Please see After Visit Summary for patient specific instructions.  No future appointments.  No orders of the defined types were placed in this encounter.      Subjective:   Patient ID:  Christian Simmons is a 31 y.o. (DOB 18-Feb-1986) male.  Chief Complaint: No chief complaint on file.   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  He was treated with chemotherapy with cisplatin and 5-FU followed by concurrent chemotherapy with weekly cisplatin and radiation from 01/04/2007 through 02/20/2007.  He only received 2 doses of chemotherapy due to severe mucositis, nausea and weight loss.  He received 4 additional courses of cisplatin and 5-FU from 04/05/2007 through 08/04/2007 and achieved a complete response.  On 07/05/2009 a fine-needle aspirate of a right level 2 lymph node returned showing a recurrent metastatic disease.  A repeat endoscopy and CT scan showed no evidence of disease elsewhere.  The patient was given 6 cycles of carboplatin, 5-FU, and docetaxel from 07/08/2009 through 12/02/2009.  On 12/03/2009 he underwent surgery to the residual lymph node in the right neck which showed no evidence of disease.   On 02/22/2012 repeat imaging showed a large recurrent mass.  He was referred elsewhere for further treatment.  He underwent a left upper lobectomy on 05/03/2012.  On 04/29/2013 a PET scan was completed which showed a lesion on the right level 2B and lower lung.  From 06/03/2013 through 02/02/2014 he had 6 cycles of chemotherapy when he was found to have a recurrence of disease.  He was treated with oxaliplatin and capecitabine.  A CT PET scan completed on 06/07/2014 showed persistent disease in the right neck lymph nodes in the left lung. A repeat left upper lobe biopsy completed on 06/29/2014 confirmed metastatic cancer.  He received palliative radiation therapy to the lung on 07/18/2014 through 07/31/2014.  A repeat CT scan completed on 10/10/2014 showed regression in the size of the lung nodule in the left upper lobe and stable pulmonary nodules.  Repeat imaging on 01/24/2015 showed stable disease in the neck and lung.  A repeat CT scan completed on 06/19/2015 showed possible mild progression in the nodule in the right side of the neck.  A PET scan completed on 06/25/2015 confirmed disease recurrence of the neck.  He was initiated on palliative chemotherapy with a lot nivolumab on 09/03/2015 with imaging studies initially showing a positive response to therapy.  Imaging studies completed on 05/19/2017 showed a matted lymph node mass in the right posterior neck which appeared larger in recent CT scans.  The right jugular vein appears occluded or resected.  Small left posterior lymph nodes were stable.  A PET scan completed on 06/03/2017 showed a hypermetabolic ill-defined right level 2B lymph node about 1.3 cm in diameter.  The  appearance was suspicious for residual or recurrent malignancy.  A left suprahilar indistinct opacity showed no worrisome hypermetabolic activity.  A 5 mm left lower lobe pulmonary nodule was stable and was currently hypermetabolic although it was below the sensitivity size threshold.  The  patient continues on nivolumab.  He was seen in the infusion room today while he was here for cycle 37 of therapy.  He reported having GERD despite his use of Pepcid a sore throat and mouth.  He denied fevers, chills, sweats, nausea, vomiting, constipation, diarrhea, or difficulty swallowing.  Medications: I have reviewed the patient's current medications.  Allergies:  Allergies  Allergen Reactions  . Heparin Other (See Comments)    No Pork derivatives due to religion    Past Medical History:  Diagnosis Date  . Arrhythmia 12/25/2015  . Carcinoma ([place])   . Fatigue 06/01/2014  . Hypothyroidism   . Infection of eyelash follicle of left eye 9/76/7341  . 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 Nasopharyngeal cancer ([place]) 06/01/2014  . Neuropathy   . Radiation 07/18/14-07/31/14   Left upper lobe  40 gy in 10 fractions  . 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 LUNG REMOVAL, PARTIAL  05/03/2012   left upper lobectomy  . nasal biopsy    . RADICAL NECK DISSECTION    . VIDEO BRONCHOSCOPY N/A 06/29/2014   Procedure: VIDEO BRONCHOSCOPY ;  Surgeon: Melrose Nakayama, MD;  Location: [facility] OR;  Service: Thoracic;  Laterality: N/A;    Family History  Problem Relation Age of Onset  . Hypertension Mother   . Diabetes Mother   . Hypertension Father   . Diabetes Father     Social History   Socioeconomic History  . Marital status: Married    Spouse name: Not on file  . Number of children: 1  . Years of education: Not on file  . Highest education level: Not on file  Social Needs  . Financial resource strain: Not on file  . Food insecurity - worry: Not on file  . Food insecurity - inability: Not on file  . Transportation needs - medical: Not on file  . Transportation needs - non-medical: Not on file  Occupational History  . Not on file  Tobacco Use  . Smoking status: Never Smoker  . Smokeless tobacco: Never Used  Substance and  Sexual Activity  . Alcohol use: No  . Drug use: No  . Sexual activity: Not on file  Other Topics Concern  . Not on file  Social History Narrative   ** Merged History Encounter **        Past Medical History, Surgical history, Social history, and Family history were reviewed and updated as appropriate.   Please see review of systems for further details on the patient's review from today.   Review of Systems:  Review of Systems  Constitutional: Negative for appetite change, chills, diaphoresis and fever.  HENT: Positive for sore throat. Negative for trouble swallowing.        Oral pharyngeal soreness  Respiratory: Negative for cough, choking and shortness of breath.   Cardiovascular: Negative for chest pain.  Gastrointestinal: Negative for constipation, diarrhea, nausea and vomiting.       GERD    Objective:   Physical Exam:  There were no vitals taken for this visit. ECOG: 0  Physical Exam  Constitutional: No distress.  HENT:  Head: Normocephalic and atraumatic.  Oropharynx is mildly erythematous with one small shallow ulceration in the right posterior pharynx.  Cardiovascular: Normal rate, regular rhythm and normal heart sounds. Exam reveals no gallop and no friction rub.  No murmur heard. Pulmonary/Chest: Effort normal and breath sounds normal. No respiratory distress. He has no wheezes. He has no rales.  Neurological: He is alert.  Skin: Skin is warm and dry. He is not diaphoretic.    Lab Review:     Component Value Date/Time   NA 139 09/22/2017 0829   K 3.5 09/22/2017 0829   CL 104 06/29/2014 0655   CO2 26 09/22/2017 0829   GLUCOSE 133 09/22/2017 0829   BUN 14.1 09/22/2017 0829   CREATININE 0.9 09/22/2017 0829   CALCIUM 9.1 09/22/2017 0829   PROT 6.8 09/22/2017 0829   ALBUMIN 4.1 09/22/2017 0829   AST 22 09/22/2017 0829   ALT 30 09/22/2017 0829   ALKPHOS 55 09/22/2017 0829   BILITOT 0.35 09/22/2017 0829   GFRNONAA >90 06/29/2014 0655   GFRAA >90  06/29/2014 0655       Component Value Date/Time   WBC 5.1 09/22/2017 0829   WBC 5.0 06/29/2014 0655   RBC 4.57 09/22/2017 0829   RBC 4.63 06/29/2014 0655   HGB 14.1 09/22/2017 0829   HCT 41.2 09/22/2017 0829   PLT 176 09/22/2017 0829   MCV 90.2 09/22/2017 0829   MCH 30.9 09/22/2017 0829   MCH 30.2 06/29/2014 0655   MCHC 34.2 09/22/2017 0829   MCHC 34.3 06/29/2014 0655   RDW 13.5 09/22/2017 0829   LYMPHSABS 1.4 09/22/2017 0829   MONOABS 0.6 09/22/2017 0829   EOSABS 0.1 09/22/2017 0829   BASOSABS 0.0 09/22/2017 0829   -------------------------------  Imaging from last 24 hours (if applicable):  Radiology interpretation: No results found.

## 2017-09-22 NOTE — Patient Instructions (Signed)
[facility] Discharge Instructions for Patients Receiving Chemotherapy  Today you received the following chemotherapy agents:  Nivolumab.  To help prevent nausea and vomiting after your treatment, we encourage you to take your nausea medication as directed.   If you develop nausea and vomiting that is not controlled by your nausea medication, call the clinic.   BELOW ARE SYMPTOMS THAT SHOULD BE REPORTED IMMEDIATELY:  *FEVER GREATER THAN 100.5 F  *CHILLS WITH OR WITHOUT FEVER  NAUSEA AND VOMITING THAT IS NOT CONTROLLED WITH YOUR NAUSEA MEDICATION  *UNUSUAL SHORTNESS OF BREATH  *UNUSUAL BRUISING OR BLEEDING  TENDERNESS IN MOUTH AND THROAT WITH OR WITHOUT PRESENCE OF ULCERS  *URINARY PROBLEMS  *BOWEL PROBLEMS  UNUSUAL RASH Items with * indicate a potential emergency and should be followed up as soon as possible.  Feel free to call the clinic you have any questions or concerns. The clinic phone number is [phone].  Please show the CHEMO ALERT CARD at check-in to the Emergency Department and triage nurse.   

## 2017-09-23 ENCOUNTER — Telehealth: Payer: Self-pay | Admitting: Hematology and Oncology

## 2017-09-23 NOTE — Telephone Encounter (Signed)
Scheduled appt per 12/19 sch message - left message with appt date and time and sent reminder letter in the mail .

## 2017-10-01 ENCOUNTER — Telehealth: Payer: Self-pay | Admitting: Hematology and Oncology

## 2017-10-01 ENCOUNTER — Telehealth: Payer: Self-pay

## 2017-10-01 NOTE — Telephone Encounter (Signed)
Spoke to patient regarding upcoming January appointment updates per 12/28 sch message.

## 2017-10-01 NOTE — Telephone Encounter (Signed)
Called requesting appts moved on 1/18 to Tuesday or Thursday due to school schedule. Scheduling message sent.

## 2017-10-19 ENCOUNTER — Ambulatory Visit: Payer: Medicaid Other | Admitting: Hematology and Oncology

## 2017-10-19 ENCOUNTER — Other Ambulatory Visit: Payer: Medicaid Other

## 2017-10-19 ENCOUNTER — Ambulatory Visit: Payer: Medicaid Other

## 2017-10-20 ENCOUNTER — Inpatient Hospital Stay (HOSPITAL_BASED_OUTPATIENT_CLINIC_OR_DEPARTMENT_OTHER): Payer: Medicaid Other | Admitting: Hematology and Oncology

## 2017-10-20 ENCOUNTER — Encounter: Payer: Self-pay | Admitting: Hematology and Oncology

## 2017-10-20 ENCOUNTER — Inpatient Hospital Stay: Payer: Medicaid Other

## 2017-10-20 ENCOUNTER — Inpatient Hospital Stay: Payer: Medicaid Other | Attending: Hematology and Oncology

## 2017-10-20 DIAGNOSIS — Z5112 Encounter for antineoplastic immunotherapy: Secondary | ICD-10-CM | POA: Insufficient documentation

## 2017-10-20 DIAGNOSIS — C760 Malignant neoplasm of head, face and neck: Secondary | ICD-10-CM | POA: Insufficient documentation

## 2017-10-20 DIAGNOSIS — E039 Hypothyroidism, unspecified: Secondary | ICD-10-CM | POA: Insufficient documentation

## 2017-10-20 DIAGNOSIS — Z95828 Presence of other vascular implants and grafts: Secondary | ICD-10-CM

## 2017-10-20 DIAGNOSIS — C7800 Secondary malignant neoplasm of unspecified lung: Secondary | ICD-10-CM

## 2017-10-20 DIAGNOSIS — J069 Acute upper respiratory infection, unspecified: Secondary | ICD-10-CM | POA: Insufficient documentation

## 2017-10-20 DIAGNOSIS — B9789 Other viral agents as the cause of diseases classified elsewhere: Secondary | ICD-10-CM

## 2017-10-20 LAB — CBC WITH DIFFERENTIAL/PLATELET
BASOS ABS: 0 10*3/uL (ref 0.0–0.1)
Basophils Relative: 0 %
EOS PCT: 4 %
Eosinophils Absolute: 0.2 10*3/uL (ref 0.0–0.5)
HEMATOCRIT: 42.1 % (ref 38.4–49.9)
Hemoglobin: 14.3 g/dL (ref 13.0–17.1)
LYMPHS ABS: 1.5 10*3/uL (ref 0.9–3.3)
LYMPHS PCT: 30 %
MCH: 30.4 pg (ref 27.2–33.4)
MCHC: 34 g/dL (ref 32.0–36.0)
MCV: 89.6 fL (ref 79.3–98.0)
MONO ABS: 0.5 10*3/uL (ref 0.1–0.9)
MONOS PCT: 9 %
NEUTROS ABS: 2.9 10*3/uL (ref 1.5–6.5)
Neutrophils Relative %: 57 %
PLATELETS: 186 10*3/uL (ref 140–400)
RBC: 4.7 MIL/uL (ref 4.20–5.82)
RDW: 13.3 % (ref 11.0–15.6)
WBC: 5.1 10*3/uL (ref 4.0–10.3)

## 2017-10-20 LAB — COMPREHENSIVE METABOLIC PANEL
ALT: 36 U/L (ref 0–55)
AST: 25 U/L (ref 5–34)
Albumin: 4 g/dL (ref 3.5–5.0)
Alkaline Phosphatase: 68 U/L (ref 40–150)
Anion gap: 10 (ref 3–11)
BILIRUBIN TOTAL: 0.4 mg/dL (ref 0.2–1.2)
BUN: 16 mg/dL (ref 7–26)
CO2: 26 mmol/L (ref 22–29)
CREATININE: 0.93 mg/dL (ref 0.70–1.30)
Calcium: 9.3 mg/dL (ref 8.4–10.4)
Chloride: 102 mmol/L (ref 98–109)
Glucose, Bld: 158 mg/dL — ABNORMAL HIGH (ref 70–140)
POTASSIUM: 3.7 mmol/L (ref 3.5–5.1)
Sodium: 138 mmol/L (ref 136–145)
TOTAL PROTEIN: 6.8 g/dL (ref 6.4–8.3)

## 2017-10-20 LAB — TSH: TSH: 1.647 u[IU]/mL (ref 0.320–4.118)

## 2017-10-20 MED ORDER — GUAIFENESIN-DM 100-10 MG/5ML PO SYRP: 5.0000 mL | ORAL_SOLUTION | ORAL | 0 refills | Status: DC | PRN

## 2017-10-20 MED ORDER — SODIUM CHLORIDE 0.9 % IJ SOLN
10.0000 mL | INTRAMUSCULAR | Status: DC | PRN
  Administered 2017-10-20: 10 mL
  Filled 2017-10-20: qty 10

## 2017-10-20 MED ORDER — ANTICOAGULANT SODIUM CITRATE 4% (200MG/5ML) IV SOLN
5.0000 mL | Freq: Once | Status: AC
  Administered 2017-10-20: 5 mL via INTRAVENOUS
  Filled 2017-10-20: qty 5

## 2017-10-20 MED ORDER — SODIUM CHLORIDE 0.9% FLUSH
10.0000 mL | Freq: Once | INTRAVENOUS | Status: AC
  Administered 2017-10-20: 10 mL
  Filled 2017-10-20: qty 10

## 2017-10-20 MED ORDER — SODIUM CHLORIDE 0.9 % IV SOLN
240.0000 mg | Freq: Once | INTRAVENOUS | Status: AC
  Administered 2017-10-20: 240 mg via INTRAVENOUS
  Filled 2017-10-20: qty 24

## 2017-10-20 MED ORDER — SODIUM CHLORIDE 0.9 % IV SOLN
Freq: Once | INTRAVENOUS | Status: AC
  Administered 2017-10-20: 13:00:00 via INTRAVENOUS

## 2017-10-20 NOTE — Patient Instructions (Signed)
[facility] Discharge Instructions for Patients Receiving Chemotherapy  Today you received the following chemotherapy agents: Nivolumab  To help prevent nausea and vomiting after your treatment, we encourage you to take your nausea medication as directed.    If you develop nausea and vomiting that is not controlled by your nausea medication, call the clinic.   BELOW ARE SYMPTOMS THAT SHOULD BE REPORTED IMMEDIATELY:  *FEVER GREATER THAN 100.5 F  *CHILLS WITH OR WITHOUT FEVER  NAUSEA AND VOMITING THAT IS NOT CONTROLLED WITH YOUR NAUSEA MEDICATION  *UNUSUAL SHORTNESS OF BREATH  *UNUSUAL BRUISING OR BLEEDING  TENDERNESS IN MOUTH AND THROAT WITH OR WITHOUT PRESENCE OF ULCERS  *URINARY PROBLEMS  *BOWEL PROBLEMS  UNUSUAL RASH Items with * indicate a potential emergency and should be followed up as soon as possible.  Feel free to call the clinic should you have any questions or concerns. The clinic phone number is [phone].  Please show the CHEMO ALERT CARD at check-in to the Emergency Department and triage nurse.   

## 2017-10-22 ENCOUNTER — Ambulatory Visit: Payer: Medicaid Other | Admitting: Hematology and Oncology

## 2017-10-22 ENCOUNTER — Encounter: Payer: Self-pay | Admitting: Hematology and Oncology

## 2017-10-22 ENCOUNTER — Ambulatory Visit: Payer: Medicaid Other

## 2017-10-22 ENCOUNTER — Other Ambulatory Visit: Payer: Medicaid Other

## 2017-10-22 DIAGNOSIS — J069 Acute upper respiratory infection, unspecified: Secondary | ICD-10-CM | POA: Insufficient documentation

## 2017-10-22 DIAGNOSIS — B9789 Other viral agents as the cause of diseases classified elsewhere: Secondary | ICD-10-CM

## 2017-10-22 NOTE — Assessment & Plan Note (Signed)
The patient has been compliant taking medications as directed I will continue to monitor TSH and adjust his thyroid medicine as needed

## 2017-10-22 NOTE — Assessment & Plan Note (Signed)
Clinically, he has no signs of bacterial infection His symptoms are likely related to viral illness He does not need prescription antibiotics Continue conservative management with over-the-counter decongestion

## 2017-10-22 NOTE — Assessment & Plan Note (Signed)
He has intermittent right neck pain that comes and goes Clinically, his neck is thickened & woody from prior surgery and fibrosis from radiation Recent PET CT scan from 06/03/17 is reviewed with the patient and in my eyes there is little clinical change over the past 20 months since his PET CT scan from January 2017 Ultimately, we agreed to continue treatment as scheduled every month I will repeat imaging study again once a year, due in [place]

## 2017-10-22 NOTE — Progress Notes (Signed)
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 Pan-endoscopy elsewhere showed cancer from nasopharyngeal space.      01/04/2007 - 02/20/2007 Chemotherapy    He received 2 cycles of cisplatin and 5FU followed by concurrent chemo with weekly cisplatin and radiation. He only received 2 doses of chemo due to severe mucositis, nausea and weight loss.      04/05/2007 - 08/04/2007 Chemotherapy    He received 4 more courses of cisplatin with 5FU and had complete response      07/05/2009 Procedure    Fine-needle aspirate of the right level II lymph nodes come from recurrent metastatic disease. Repeat endoscopy and CT scan show no evidence of disease elsewhere.      07/08/2009 - 12/02/2009 Chemotherapy    He was given 6 cycles of carboplatin, 5-FU and docetaxel      12/03/2009 Surgery    He has surgery to the residual lymph node on the right neck which showed no evidence of disease.      02/22/2012 Imaging    Repeat imaging study showed large recurrent mass. He was referred  elsewhere for further treatment.      05/03/2012 Surgery    He underwent left upper lobectomy.      04/29/2013 Imaging    PEt scan showed lesion on right level II B and lower lung was abnormal      06/03/2013 - 02/02/2014 Chemotherapy    He had 6 cycles of chemotherapy when he was found to have recurrence of cancer and had received oxaliplatin and capecitabine      06/07/2014 Imaging    PET CT scan showed persistent disease in the right neck lymph nodes and left lung      06/29/2014 Procedure    Accession: [id number] repeat LUL biopsy confirmed metastatic cancer      07/18/2014 - 07/31/2014 Radiation Therapy    He received palliative radiation therapy to the lungs      10/10/2014 Imaging    CT scan of the chest, abdomen and pelvis show regression in the size of the lung nodule in the left upper lobe and stable pulmonary nodules      01/24/2015 Imaging    CT scan showed stable disease in neck and lung      06/19/2015 Imaging    CT scan of the neck and the chest show possible mild progression of the nodule in the right side of the neck.      06/25/2015 Imaging    PET scan confirmed disease recurrence in the neck      07/07/2015  Imaging    He had MRI neck at [facility]      09/03/2015 -  Chemotherapy    He received palliative chemo with Nivolumab      10/29/2015 Imaging    PET CT showed positive response to Rx      02/28/2016 Imaging    Ct abdomen showed abnormal thinkening in his stomach      03/03/2016 Imaging    CT: Right sternocleidomastoid muscle metastasis appears less distinct but otherwise not significantly changed in size or configuration since 06/19/2015.2. Left level 3 lymph node which was hypermetabolic by PET-CT in January 2017 appears slightly smaller      04/01/2016 Imaging    CT cervical spine showed no acute fracture or traumatic malalignment in the cervical spine      04/22/2016 Procedure    Port-a-cath placed.      06/16/2016 Imaging    Ct neck showed  right sternocleidomastoid muscle metastasis is further decreased in conspicuity since May, and has mildly decreased in size since September 2016. Continued stability of sub-centimeter left cervical lymph nodes. No new or progressive metastatic disease in the neck.      06/16/2016 Imaging    CT chest showed stable masslike radiation fibrosis in the left upper lobe. Stable subcentimeter pulmonary nodules in the bilateral lower lobes. No new or progressive metastatic disease in the chest. Nonobstructing left renal stone.      10/13/2016 Imaging    Ct neck showed unchanged right sternocleidomastoid muscle metastasis. Unchanged subcentimeter left cervical lymph nodes. No evidence of new or progressive metastatic disease in the neck.      10/13/2016 Imaging    CT chest showed tiny hypervascular foci in the liver, not definitely seen on prior imaging of 06/16/2016 and 02/28/2016. Abdomen MRI without and with contrast recommended to further evaluate as metastatic disease is a concern. 2. Stable appearance of post treatment changes left upper lung and scattered tiny bilateral pulmonary nodules.      02/11/2017 Imaging    Ct neck: Lymph node mass right posterior neck appears improved from the prior study. Small posterior lymph nodes on the left unchanged. Occluded right jugular vein unchanged.      02/11/2017 Imaging    1. Similar appearance of postsurgical and radiation changes in the left upper lobe. 2. Similar bilateral pulmonary nodules. 3. No thoracic adenopathy. 4. Subtle foci of post-contrast enhancement within the liver are suboptimally characterized on this nondedicated study. Likely similar. These could either be re-evaluated at followup or more entirely characterized with abdominal MRI. 5. Left nephrolithiasis.      05/19/2017 Imaging    Matted lymph node mass right posterior neck appears larger in the recent CT. Accurate measurements difficult due to infiltrating tumor margins and  infiltration of the muscle. Right jugular vein again appears occluded or resected. Small left posterior lymph nodes stable. Left upper lobe airspace density stable and similar to the prior CT      06/03/2017 PET scan    1. Hypermetabolic ill-defined right level IIb lymph node, about 1.3 cm in diameter with maximum SUV 9.5 (formerly 8.1). Appearance suspicious for residual/recurrent malignancy. No worrisome left-sided lesion. 2. Left suprahilar indistinct opacity demonstrates no worrisome hypermetabolic activity. The 5 mm left lower lobe pulmonary nodule is stable and not currently hypermetabolic although below sensitive PET-CT size thresholds. 3. Other imaging findings of potential clinical significance: Bilateral nonobstructive nephrolithiasis. Chronic bilateral maxillary sinusitis.       INTERVAL HISTORY: Please see below for problem oriented  charting. He returns for his monthly chemotherapy He denies significant neck pain He has some mild cough and congestion but no fever or chills Mild sensation of sore throat His children at home are also sick with some cold-like illness He denies new onset of pain No neck lymphadenopathy  REVIEW OF SYSTEMS:   Constitutional: Denies fevers, chills or abnormal weight loss Eyes: Denies blurriness of vision Cardiovascular: Denies palpitation, chest discomfort or lower extremity swelling Gastrointestinal:  Denies nausea, heartburn or change in bowel habits Skin: Denies abnormal skin rashes Lymphatics: Denies new lymphadenopathy or easy bruising Neurological:Denies numbness, tingling or new weaknesses Behavioral/Psych: Mood is stable, no new changes  All other systems were reviewed with the patient and are negative.  I have reviewed the past medical history, past surgical history, social history and family history with the patient and they are unchanged from previous note.  ALLERGIES:  is allergic to heparin.  MEDICATIONS:  Current Outpatient  Medications  Medication Sig Dispense Refill  . cyclobenzaprine (FLEXERIL) 5 MG tablet Take 1 tablet (5 mg total) by mouth 3 (three) times daily as needed for muscle spasms. 60 tablet 0  . diclofenac (VOLTAREN) 50 MG EC tablet TAKE 1 TABLET BY MOUTH 3 TIMES DAILY AS NEEDED FOR MILD PAIN. 60 tablet 1  . famotidine (PEPCID) 20 MG tablet Take 1 tablet (20 mg total) by mouth 2 (two) times daily. 30 tablet 0  . guaiFENesin-dextromethorphan (ROBITUSSIN DM) 100-10 MG/5ML syrup Take 5 mLs by mouth every 4 (four) hours as needed for cough. 473 mL 0  . levothyroxine (SYNTHROID, LEVOTHROID) 125 MCG tablet Take 1 tablet (125 mcg total) daily before breakfast by mouth. 30 tablet 9  . lidocaine (XYLOCAINE) 2 % solution 5 ml PO swish and spit every 3 hours as needed for mouth pain 100 mL 3  . lidocaine-prilocaine (EMLA) cream Apply 1 application topically as needed. (Patient taking differently: Apply 1 application topically as needed (for port access). ) 30 g 3  . magic mouthwash SOLN Take 5 mLs by mouth 4 (four) times daily as needed for mouth pain. Swish and swallow or spit for mouth pain 240 mL 5  . meclizine (ANTIVERT) 25 MG tablet Take 1 tablet (25 mg total) by mouth 3 (three) times daily as needed for dizziness. 30 tablet 0  . naproxen (NAPROSYN) 500 MG tablet Take 1 tablet (500 mg total) by mouth 2 (two) times daily as needed. 30 tablet 0  . omeprazole (PRILOSEC) 40 MG capsule Take 1 capsule (40 mg total) by mouth daily. 30 capsule 5  . oxyCODONE (ROXICODONE) 15 MG immediate release tablet Take 1 tablet (15 mg total) every 4 (four) hours as needed by mouth for pain. 60 tablet 0   No current facility-administered medications for this visit.    Facility-Administered Medications Ordered in Other Visits  Medication Dose Route Frequency Provider Last Rate Last Dose  . sodium chloride 0.9 % injection 10 mL  10 mL Intracatheter PRN Alvy Bimler, Natilee Gauer, MD        PHYSICAL EXAMINATION: ECOG PERFORMANCE STATUS: 1 -  Symptomatic but completely ambulatory  Vitals:   10/20/17 1217  BP: 120/77  Pulse: 90  Resp: 18  Temp: 97.7 F (36.5 C)  SpO2: (!) 0%   Filed Weights   10/20/17 1217  Weight: 175 lb 8 oz (79.6 kg)    GENERAL:alert, no distress and comfortable SKIN: skin color, texture, turgor are normal, no rashes or significant lesions EYES: normal, Conjunctiva are pink and non-injected, sclera clear  OROPHARYNX:no exudate, no erythema and lips, buccal mucosa, and tongue normal  NECK: Well-healed surgical scar. LYMPH:  no palpable lymphadenopathy in the cervical, axillary or inguinal LUNGS: clear to auscultation and percussion with normal breathing effort HEART: regular rate & rhythm and no murmurs and no lower extremity edema ABDOMEN:abdomen soft, non-tender and normal bowel sounds Musculoskeletal:no cyanosis of digits and no clubbing  NEURO: alert & oriented x 3 with fluent speech, no focal motor/sensory deficits  LABORATORY DATA:  I have reviewed the data as listed    Component Value Date/Time   NA 138 10/20/2017 1145   NA 139 09/22/2017 0829   K 3.7 10/20/2017 1145   K 3.5 09/22/2017 0829   CL 102 10/20/2017 1145   CO2 26 10/20/2017 1145   CO2 26 09/22/2017 0829   GLUCOSE 158 (H) 10/20/2017 1145   GLUCOSE 133 09/22/2017 0829   BUN 16 10/20/2017 1145   BUN 14.1 09/22/2017 0829   CREATININE 0.93 10/20/2017 1145   CREATININE 0.9 09/22/2017 0829   CALCIUM 9.3 10/20/2017 1145   CALCIUM 9.1 09/22/2017 0829   PROT 6.8 10/20/2017 1145   PROT 6.8 09/22/2017 0829   ALBUMIN 4.0 10/20/2017 1145   ALBUMIN 4.1 09/22/2017 0829   AST 25 10/20/2017 1145   AST 22 09/22/2017 0829   ALT 36 10/20/2017 1145   ALT 30 09/22/2017 0829   ALKPHOS 68 10/20/2017 1145   ALKPHOS 55 09/22/2017 0829   BILITOT 0.4 10/20/2017 1145   BILITOT 0.35 09/22/2017 0829   GFRNONAA >60 10/20/2017 1145   GFRAA >60 10/20/2017 1145    No results found for: SPEP, UPEP  Lab Results  Component Value Date   WBC  5.1 10/20/2017   NEUTROABS 2.9 10/20/2017   HGB 14.3 10/20/2017   HCT 42.1 10/20/2017   MCV 89.6 10/20/2017   PLT 186 10/20/2017      Chemistry      Component Value Date/Time   NA 138 10/20/2017 1145   NA 139 09/22/2017 0829   K 3.7 10/20/2017 1145   K 3.5 09/22/2017 0829   CL 102 10/20/2017 1145   CO2 26 10/20/2017 1145   CO2 26 09/22/2017 0829   BUN 16 10/20/2017 1145   BUN 14.1 09/22/2017 0829   CREATININE 0.93 10/20/2017 1145   CREATININE 0.9 09/22/2017 0829      Component Value Date/Time   CALCIUM 9.3 10/20/2017 1145   CALCIUM 9.1 09/22/2017 0829   ALKPHOS 68 10/20/2017 1145   ALKPHOS 55 09/22/2017 0829   AST 25 10/20/2017 1145   AST 22 09/22/2017 0829   ALT 36 10/20/2017 1145   ALT 30 09/22/2017 0829   BILITOT 0.4 10/20/2017 1145   BILITOT 0.35 09/22/2017 0829      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[place]) He has intermittent right neck pain that comes and goes Clinically, his neck is thickened & woody from prior surgery and fibrosis from radiation Recent PET CT scan from 06/03/17 is reviewed with the patient and in my eyes there is little clinical change over the past 20 months since his PET CT scan from January 2017 Ultimately, we agreed to continue treatment as scheduled every month I will repeat imaging study again once a year, due in [place]   Acquired hypothyroidism The patient has been compliant taking medications as directed I will continue to monitor TSH and adjust his thyroid medicine as needed  Viral URI with cough Clinically, he has no signs of bacterial infection His  symptoms are likely related to viral illness He does not need prescription antibiotics Continue conservative management with over-the-counter decongestion   No orders of the defined types were placed in this encounter.  All questions were answered. The patient knows to call the clinic with any problems, questions or concerns. No barriers to learning was  detected. I spent 15 minutes counseling the patient face to face. The total time spent in the appointment was 20 minutes and more than 50% was on counseling and review of test results     [name], MD 10/22/2017 7:18 AM

## 2017-11-16 ENCOUNTER — Telehealth: Payer: Self-pay | Admitting: *Deleted

## 2017-11-16 MED ORDER — OXYCODONE HCL 15 MG PO TABS: 15.0000 mg | ORAL_TABLET | ORAL | 0 refills | Status: DC | PRN

## 2017-11-16 NOTE — Telephone Encounter (Signed)
Pt called requesting a refill of oxycodone and wanting to know when he is scheduled to come back.  Notified of appts

## 2017-11-18 ENCOUNTER — Telehealth: Payer: Self-pay | Admitting: Medical Oncology

## 2017-11-18 ENCOUNTER — Ambulatory Visit: Payer: Medicaid Other

## 2017-11-18 ENCOUNTER — Ambulatory Visit: Payer: Medicaid Other | Admitting: Hematology and Oncology

## 2017-11-18 ENCOUNTER — Other Ambulatory Visit: Payer: Medicaid Other

## 2017-11-18 ENCOUNTER — Telehealth: Payer: Self-pay

## 2017-11-18 ENCOUNTER — Encounter: Payer: Self-pay | Admitting: Hematology and Oncology

## 2017-11-18 NOTE — Telephone Encounter (Signed)
Pt called to reschedule his appointments missed today.  Msg will be sent to the schedulers to call him back.

## 2017-11-18 NOTE — Telephone Encounter (Signed)
Call transferred to Dr Alvy Bimler nurse.

## 2017-11-19 ENCOUNTER — Telehealth: Payer: Self-pay | Admitting: Hematology and Oncology

## 2017-11-19 NOTE — Telephone Encounter (Signed)
Scheduled appt per 2/15 sch msg - spoke with patient regarding appts.

## 2017-11-25 ENCOUNTER — Inpatient Hospital Stay (HOSPITAL_BASED_OUTPATIENT_CLINIC_OR_DEPARTMENT_OTHER): Payer: Medicaid Other | Admitting: Hematology and Oncology

## 2017-11-25 ENCOUNTER — Inpatient Hospital Stay: Payer: Medicaid Other

## 2017-11-25 ENCOUNTER — Inpatient Hospital Stay: Payer: Medicaid Other | Attending: Hematology and Oncology

## 2017-11-25 ENCOUNTER — Telehealth: Payer: Self-pay | Admitting: Hematology and Oncology

## 2017-11-25 DIAGNOSIS — C7800 Secondary malignant neoplasm of unspecified lung: Secondary | ICD-10-CM

## 2017-11-25 DIAGNOSIS — R42 Dizziness and giddiness: Secondary | ICD-10-CM | POA: Diagnosis not present

## 2017-11-25 DIAGNOSIS — Z598 Other problems related to housing and economic circumstances: Secondary | ICD-10-CM

## 2017-11-25 DIAGNOSIS — C760 Malignant neoplasm of head, face and neck: Secondary | ICD-10-CM

## 2017-11-25 DIAGNOSIS — Z95828 Presence of other vascular implants and grafts: Secondary | ICD-10-CM

## 2017-11-25 DIAGNOSIS — Z5112 Encounter for antineoplastic immunotherapy: Secondary | ICD-10-CM | POA: Diagnosis present

## 2017-11-25 DIAGNOSIS — E039 Hypothyroidism, unspecified: Secondary | ICD-10-CM

## 2017-11-25 DIAGNOSIS — Z599 Problem related to housing and economic circumstances, unspecified: Secondary | ICD-10-CM

## 2017-11-25 LAB — COMPREHENSIVE METABOLIC PANEL WITH GFR
ALT: 40 U/L (ref 0–55)
AST: 31 U/L (ref 5–34)
Albumin: 4.2 g/dL (ref 3.5–5.0)
Alkaline Phosphatase: 63 U/L (ref 40–150)
Anion gap: 12 — ABNORMAL HIGH (ref 3–11)
BUN: 13 mg/dL (ref 7–26)
CO2: 24 mmol/L (ref 22–29)
Calcium: 9.8 mg/dL (ref 8.4–10.4)
Chloride: 103 mmol/L (ref 98–109)
Creatinine, Ser: 1.07 mg/dL (ref 0.70–1.30)
GFR calc Af Amer: >60 mL/min
GFR calc non Af Amer: >60 mL/min
Glucose, Bld: 166 mg/dL — ABNORMAL HIGH (ref 70–140)
Potassium: 4.1 mmol/L (ref 3.5–5.1)
Sodium: 139 mmol/L (ref 136–145)
Total Bilirubin: 0.3 mg/dL (ref 0.2–1.2)
Total Protein: 7.3 g/dL (ref 6.4–8.3)

## 2017-11-25 LAB — CBC WITH DIFFERENTIAL/PLATELET
Basophils Absolute: 0 10*3/uL (ref 0.0–0.1)
Basophils Relative: 0 %
Eosinophils Absolute: 0.1 10*3/uL (ref 0.0–0.5)
Eosinophils Relative: 3 %
HEMATOCRIT: 42.9 % (ref 38.4–49.9)
HEMOGLOBIN: 14.8 g/dL (ref 13.0–17.1)
LYMPHS ABS: 1.4 10*3/uL (ref 0.9–3.3)
LYMPHS PCT: 28 %
MCH: 30.5 pg (ref 27.2–33.4)
MCHC: 34.5 g/dL (ref 32.0–36.0)
MCV: 88.5 fL (ref 79.3–98.0)
MONOS PCT: 5 %
Monocytes Absolute: 0.3 10*3/uL (ref 0.1–0.9)
NEUTROS ABS: 3.1 10*3/uL (ref 1.5–6.5)
NEUTROS PCT: 64 %
NRBC: 0 /100{WBCs}
Platelets: 200 10*3/uL (ref 140–400)
RBC: 4.85 MIL/uL (ref 4.20–5.82)
RDW: 13.1 % (ref 11.0–14.6)
WBC: 4.9 10*3/uL (ref 4.0–10.3)

## 2017-11-25 LAB — TSH: TSH: 2.909 u[IU]/mL (ref 0.320–4.118)

## 2017-11-25 MED ORDER — ANTICOAGULANT SODIUM CITRATE 4% (200MG/5ML) IV SOLN
5.0000 mL | Freq: Once | Status: AC
  Administered 2017-11-25: 5 mL via INTRAVENOUS
  Filled 2017-11-25: qty 5

## 2017-11-25 MED ORDER — SODIUM CHLORIDE 0.9 % IV SOLN
Freq: Once | INTRAVENOUS | Status: AC
  Administered 2017-11-25: 13:00:00 via INTRAVENOUS

## 2017-11-25 MED ORDER — SODIUM CHLORIDE 0.9% FLUSH
10.0000 mL | Freq: Once | INTRAVENOUS | Status: AC
  Administered 2017-11-25: 10 mL
  Filled 2017-11-25: qty 10

## 2017-11-25 MED ORDER — SODIUM CHLORIDE 0.9 % IV SOLN
240.0000 mg | Freq: Once | INTRAVENOUS | Status: AC
  Administered 2017-11-25: 240 mg via INTRAVENOUS
  Filled 2017-11-25: qty 24

## 2017-11-25 MED ORDER — SODIUM CHLORIDE 0.9 % IJ SOLN
10.0000 mL | INTRAMUSCULAR | Status: DC | PRN
  Administered 2017-11-25: 10 mL
  Filled 2017-11-25: qty 10

## 2017-11-25 NOTE — Patient Instructions (Signed)
[facility] Discharge Instructions for Patients Receiving Chemotherapy  Today you received the following chemotherapy agents: Nivolumab  To help prevent nausea and vomiting after your treatment, we encourage you to take your nausea medication as directed.    If you develop nausea and vomiting that is not controlled by your nausea medication, call the clinic.   BELOW ARE SYMPTOMS THAT SHOULD BE REPORTED IMMEDIATELY:  *FEVER GREATER THAN 100.5 F  *CHILLS WITH OR WITHOUT FEVER  NAUSEA AND VOMITING THAT IS NOT CONTROLLED WITH YOUR NAUSEA MEDICATION  *UNUSUAL SHORTNESS OF BREATH  *UNUSUAL BRUISING OR BLEEDING  TENDERNESS IN MOUTH AND THROAT WITH OR WITHOUT PRESENCE OF ULCERS  *URINARY PROBLEMS  *BOWEL PROBLEMS  UNUSUAL RASH Items with * indicate a potential emergency and should be followed up as soon as possible.  Feel free to call the clinic should you have any questions or concerns. The clinic phone number is [phone].  Please show the CHEMO ALERT CARD at check-in to the Emergency Department and triage nurse.   

## 2017-11-25 NOTE — Telephone Encounter (Signed)
Gave avs and calendar for march

## 2017-11-26 ENCOUNTER — Encounter: Payer: Self-pay | Admitting: Hematology and Oncology

## 2017-11-26 DIAGNOSIS — Z598 Other problems related to housing and economic circumstances: Secondary | ICD-10-CM | POA: Insufficient documentation

## 2017-11-26 DIAGNOSIS — Z599 Problem related to housing and economic circumstances, unspecified: Secondary | ICD-10-CM | POA: Insufficient documentation

## 2017-11-26 NOTE — Assessment & Plan Note (Signed)
He has intermittent right neck pain that comes and goes Clinically, his neck is thickened & woody from prior surgery and fibrosis from radiation Recent PET CT scan from 06/03/17 is reviewed with the patient and in my eyes there is little clinical change over the past 20 months since his PET CT scan from January 2017 Ultimately, we agreed to continue treatment as scheduled every month I will repeat imaging study again once a year, due in [place]

## 2017-11-26 NOTE — Assessment & Plan Note (Signed)
He has financial difficulties recently due to difficulties paying for his bills due to his educational course The patient does not want to take out a loan He is frustrated I recommend talking to financial counselor or social worker but the patient declined

## 2017-11-26 NOTE — Assessment & Plan Note (Signed)
He has intermittent vertigo I recommend he takes meclizine as needed

## 2017-11-26 NOTE — Progress Notes (Signed)
[place] OFFICE PROGRESS NOTE  Patient Care Team: Patient, No Pcp Per as PCP - General (General Practice) [name], MD as Consulting Physician (Hematology and Oncology) Eppie Gibson, MD as Attending Physician (Radiation Oncology) Jodi Marble, MD as Consulting Physician (Otolaryngology) Philomena Doheny, MD as Referring Physician (Plastic Surgery) Irene Shipper, MD as Consulting Physician (Gastroenterology) System, Provider Not In  ASSESSMENT & PLAN:  Primary cancer of head and neck [facility]) He has intermittent right neck pain that comes and goes Clinically, his neck is thickened & woody from prior surgery and fibrosis from radiation Recent PET CT scan from 06/03/17 is reviewed with the patient and in my eyes there is little clinical change over the past 20 months since his PET CT scan from January 2017 Ultimately, we agreed to continue treatment as scheduled every month I will repeat imaging study again once a year, due in [place]   Vertigo, intermittent He has intermittent vertigo I recommend he takes meclizine as needed  Acquired hypothyroidism The patient has been compliant taking medications as directed I will continue to monitor TSH and adjust his thyroid medicine as needed  Financial difficulties He has financial difficulties recently due to difficulties paying for his bills due to his educational course The patient does not want to take out a loan He is frustrated I recommend talking to financial counselor or social worker but the patient declined   No orders of the defined types were placed in this encounter.   INTERVAL HISTORY: Please see below for problem oriented charting. 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 Pan-endoscopy elsewhere showed cancer from nasopharyngeal space.      01/04/2007 - 02/20/2007 Chemotherapy    He received 2 cycles of cisplatin and 5FU followed by concurrent chemo with weekly cisplatin and radiation. He only received 2 doses of chemo due to severe mucositis, nausea and weight loss.      04/05/2007 - 08/04/2007 Chemotherapy    He received 4 more courses of cisplatin with 5FU and had complete response      07/05/2009 Procedure    Fine-needle aspirate of the right level II lymph nodes come from recurrent metastatic disease. Repeat endoscopy and CT scan show no evidence of disease elsewhere.      07/08/2009 - 12/02/2009 Chemotherapy    He was given 6 cycles of carboplatin, 5-FU and docetaxel      12/03/2009 Surgery    He has surgery to the residual lymph node on the right neck which showed no evidence of disease.      02/22/2012 Imaging    Repeat imaging study showed large recurrent mass. He was referred elsewhere for further  treatment.      05/03/2012 Surgery    He underwent left upper lobectomy.      04/29/2013 Imaging    PEt scan showed lesion on right level II B and lower lung was abnormal      06/03/2013 - 02/02/2014 Chemotherapy    He had 6 cycles of chemotherapy when he was found to have recurrence of cancer and had received oxaliplatin and capecitabine      06/07/2014 Imaging    PET CT scan showed  persistent disease in the right neck lymph nodes and left lung      06/29/2014 Procedure    Accession: [id number] repeat LUL biopsy confirmed metastatic cancer      07/18/2014 - 07/31/2014 Radiation Therapy    He received palliative radiation therapy to the lungs      10/10/2014 Imaging    CT scan of the chest, abdomen and pelvis show regression in the size of the lung nodule in the left upper lobe and stable pulmonary nodules      01/24/2015 Imaging    CT scan showed stable disease in neck and lung      06/19/2015 Imaging    CT scan of the neck and the chest show possible mild progression of the nodule in the right side of the neck.      06/25/2015 Imaging    PET scan confirmed disease recurrence in the neck      07/07/2015 Imaging    He had MRI neck at [facility]      09/03/2015 -  Chemotherapy    He received palliative chemo with Nivolumab      10/29/2015 Imaging    PET CT showed positive response to Rx      02/28/2016 Imaging    Ct abdomen showed abnormal thinkening in his stomach      03/03/2016 Imaging    CT: Right sternocleidomastoid muscle metastasis appears less distinct but otherwise not significantly changed in size or configuration since 06/19/2015.2. Left level 3 lymph node which was hypermetabolic by PET-CT in January 2017 appears slightly smaller      04/01/2016 Imaging    CT cervical spine showed no acute fracture or traumatic malalignment in the cervical spine      04/22/2016 Procedure    Port-a-cath placed.      06/16/2016 Imaging    Ct neck showed right sternocleidomastoid muscle metastasis is further decreased in conspicuity since May, and has mildly decreased in size since September 2016. Continued stability of sub-centimeter left cervical lymph nodes. No new or progressive metastatic disease in the neck.      06/16/2016 Imaging    CT chest showed stable masslike radiation fibrosis in the left upper lobe. Stable subcentimeter pulmonary nodules in the  bilateral lower lobes. No new or progressive metastatic disease in the chest. Nonobstructing left renal stone.      10/13/2016 Imaging    Ct neck showed unchanged right sternocleidomastoid muscle metastasis. Unchanged subcentimeter left cervical lymph nodes. No evidence of new or progressive metastatic disease in the neck.      10/13/2016 Imaging    CT chest showed tiny hypervascular foci in the liver, not definitely seen on prior imaging of 06/16/2016 and 02/28/2016. Abdomen MRI without and with contrast recommended to further evaluate as metastatic disease is a concern. 2. Stable appearance of post treatment changes left upper lung and scattered tiny bilateral pulmonary nodules.      02/11/2017 Imaging    Ct neck: Lymph node mass right  posterior neck appears improved from the prior study. Small posterior lymph nodes on the left unchanged. Occluded right jugular vein unchanged.      02/11/2017 Imaging    1. Similar appearance of postsurgical and radiation changes in the left upper lobe. 2. Similar bilateral pulmonary nodules. 3. No thoracic adenopathy. 4. Subtle foci of post-contrast enhancement within the liver are suboptimally characterized on this nondedicated study. Likely similar. These could either be re-evaluated at followup or more entirely characterized with abdominal MRI. 5. Left nephrolithiasis.      05/19/2017 Imaging    Matted lymph node mass right posterior neck appears larger in the recent CT. Accurate measurements difficult due to infiltrating tumor margins and infiltration of the muscle. Right jugular vein again appears occluded or resected. Small left posterior lymph nodes stable. Left upper lobe airspace density stable and similar to the prior CT      06/03/2017 PET scan    1. Hypermetabolic ill-defined right level IIb lymph node, about 1.3 cm in diameter with maximum SUV 9.5 (formerly 8.1). Appearance suspicious for residual/recurrent malignancy. No worrisome left-sided  lesion. 2. Left suprahilar indistinct opacity demonstrates no worrisome hypermetabolic activity. The 5 mm left lower lobe pulmonary nodule is stable and not currently hypermetabolic although below sensitive PET-CT size thresholds. 3. Other imaging findings of potential clinical significance: Bilateral nonobstructive nephrolithiasis. Chronic bilateral maxillary sinusitis.       REVIEW OF SYSTEMS:   Constitutional: Denies fevers, chills or abnormal weight loss Eyes: Denies blurriness of vision Ears, nose, mouth, throat, and face: Denies mucositis or sore throat Respiratory: Denies cough, dyspnea or wheezes Cardiovascular: Denies palpitation, chest discomfort or lower extremity swelling Gastrointestinal:  Denies nausea, heartburn or change in bowel habits Skin: Denies abnormal skin rashes Lymphatics: Denies new lymphadenopathy or easy bruising Neurological:Denies numbness, tingling or new weaknesses Behavioral/Psych: Mood is stable, no new changes  All other systems were reviewed with the patient and are negative.  I have reviewed the past medical history, past surgical history, social history and family history with the patient and they are unchanged from previous note.  ALLERGIES:  is allergic to heparin.  MEDICATIONS:  Current Outpatient Medications  Medication Sig Dispense Refill  . cyclobenzaprine (FLEXERIL) 5 MG tablet Take 1 tablet (5 mg total) by mouth 3 (three) times daily as needed for muscle spasms. 60 tablet 0  . diclofenac (VOLTAREN) 50 MG EC tablet TAKE 1 TABLET BY MOUTH 3 TIMES DAILY AS NEEDED FOR MILD PAIN. 60 tablet 1  . famotidine (PEPCID) 20 MG tablet Take 1 tablet (20 mg total) by mouth 2 (two) times daily. 30 tablet 0  . guaiFENesin-dextromethorphan (ROBITUSSIN DM) 100-10 MG/5ML syrup Take 5 mLs by mouth every 4 (four) hours as needed for cough. 473 mL 0  . levothyroxine (SYNTHROID, LEVOTHROID) 125 MCG tablet Take 1 tablet (125 mcg total) daily before breakfast by  mouth. 30 tablet 9  . lidocaine (XYLOCAINE) 2 % solution 5 ml PO swish and spit every 3 hours as needed for mouth pain 100 mL 3  . lidocaine-prilocaine (EMLA) cream Apply 1 application topically as needed. (Patient taking differently: Apply 1 application topically as needed (for port access). ) 30 g 3  . magic mouthwash SOLN Take 5 mLs by mouth 4 (four) times daily as needed for mouth pain. Swish and swallow or spit for mouth pain 240 mL 5  . meclizine (ANTIVERT) 25 MG tablet Take 1 tablet (25 mg total) by mouth 3 (three) times daily as needed for dizziness.  30 tablet 0  . naproxen (NAPROSYN) 500 MG tablet Take 1 tablet (500 mg total) by mouth 2 (two) times daily as needed. 30 tablet 0  . omeprazole (PRILOSEC) 40 MG capsule Take 1 capsule (40 mg total) by mouth daily. 30 capsule 5  . oxyCODONE (ROXICODONE) 15 MG immediate release tablet Take 1 tablet (15 mg total) by mouth every 4 (four) hours as needed for pain. 60 tablet 0   No current facility-administered medications for this visit.    Facility-Administered Medications Ordered in Other Visits  Medication Dose Route Frequency Provider Last Rate Last Dose  . sodium chloride 0.9 % injection 10 mL  10 mL Intracatheter PRN Alvy Bimler, Aneyah Lortz, MD        PHYSICAL EXAMINATION: ECOG PERFORMANCE STATUS: 1 - Symptomatic but completely ambulatory  Vitals:   11/25/17 1232  BP: 137/73  Pulse: 76  Resp: 18  Temp: 98.4 F (36.9 C)  SpO2: 100%   Filed Weights   11/25/17 1232  Weight: 177 lb 11.2 oz (80.6 kg)    GENERAL:alert, no distress and comfortable SKIN: skin color, texture, turgor are normal, no rashes or significant lesions EYES: normal, Conjunctiva are pink and non-injected, sclera clear OROPHARYNX:no exudate, no erythema and lips, buccal mucosa, and tongue normal  NECK: Well-healed surgical scar. LYMPH:  no palpable lymphadenopathy in the cervical, axillary or inguinal LUNGS: clear to auscultation and percussion with normal breathing  effort HEART: regular rate & rhythm and no murmurs and no lower extremity edema ABDOMEN:abdomen soft, non-tender and normal bowel sounds Musculoskeletal:no cyanosis of digits and no clubbing  NEURO: alert & oriented x 3 with fluent speech, no focal motor/sensory deficits  LABORATORY DATA:  I have reviewed the data as listed    Component Value Date/Time   NA 139 11/25/2017 1201   NA 139 09/22/2017 0829   K 4.1 11/25/2017 1201   K 3.5 09/22/2017 0829   CL 103 11/25/2017 1201   CO2 24 11/25/2017 1201   CO2 26 09/22/2017 0829   GLUCOSE 166 (H) 11/25/2017 1201   GLUCOSE 133 09/22/2017 0829   BUN 13 11/25/2017 1201   BUN 14.1 09/22/2017 0829   CREATININE 1.07 11/25/2017 1201   CREATININE 0.9 09/22/2017 0829   CALCIUM 9.8 11/25/2017 1201   CALCIUM 9.1 09/22/2017 0829   PROT 7.3 11/25/2017 1201   PROT 6.8 09/22/2017 0829   ALBUMIN 4.2 11/25/2017 1201   ALBUMIN 4.1 09/22/2017 0829   AST 31 11/25/2017 1201   AST 22 09/22/2017 0829   ALT 40 11/25/2017 1201   ALT 30 09/22/2017 0829   ALKPHOS 63 11/25/2017 1201   ALKPHOS 55 09/22/2017 0829   BILITOT 0.3 11/25/2017 1201   BILITOT 0.35 09/22/2017 0829   GFRNONAA >60 11/25/2017 1201   GFRAA >60 11/25/2017 1201    No results found for: SPEP, UPEP  Lab Results  Component Value Date   WBC 4.9 11/25/2017   NEUTROABS 3.1 11/25/2017   HGB 14.8 11/25/2017   HCT 42.9 11/25/2017   MCV 88.5 11/25/2017   PLT 200 11/25/2017      Chemistry      Component Value Date/Time   NA 139 11/25/2017 1201   NA 139 09/22/2017 0829   K 4.1 11/25/2017 1201   K 3.5 09/22/2017 0829   CL 103 11/25/2017 1201   CO2 24 11/25/2017 1201   CO2 26 09/22/2017 0829   BUN 13 11/25/2017 1201   BUN 14.1 09/22/2017 0829   CREATININE 1.07 11/25/2017 1201   CREATININE  0.9 09/22/2017 0829      Component Value Date/Time   CALCIUM 9.8 11/25/2017 1201   CALCIUM 9.1 09/22/2017 0829   ALKPHOS 63 11/25/2017 1201   ALKPHOS 55 09/22/2017 0829   AST 31 11/25/2017  1201   AST 22 09/22/2017 0829   ALT 40 11/25/2017 1201   ALT 30 09/22/2017 0829   BILITOT 0.3 11/25/2017 1201   BILITOT 0.35 09/22/2017 0829      All questions were answered. The patient knows to call the clinic with any problems, questions or concerns. No barriers to learning was detected.  I spent 15 minutes counseling the patient face to face. The total time spent in the appointment was 20 minutes and more than 50% was on counseling and review of test results  [name], MD 11/26/2017 9:45 AM

## 2017-11-26 NOTE — Assessment & Plan Note (Signed)
The patient has been compliant taking medications as directed I will continue to monitor TSH and adjust his thyroid medicine as needed

## 2017-12-16 ENCOUNTER — Telehealth: Payer: Self-pay

## 2017-12-16 NOTE — Telephone Encounter (Signed)
Called patient and left a message with his appt date of 3/21 starting at 1245. He left a message on the after hours number requesting a call with date and time of next appt.

## 2017-12-23 ENCOUNTER — Other Ambulatory Visit: Payer: Self-pay | Admitting: Hematology and Oncology

## 2017-12-23 ENCOUNTER — Inpatient Hospital Stay: Payer: Medicaid Other

## 2017-12-23 ENCOUNTER — Encounter: Payer: Self-pay | Admitting: Hematology and Oncology

## 2017-12-23 ENCOUNTER — Inpatient Hospital Stay (HOSPITAL_BASED_OUTPATIENT_CLINIC_OR_DEPARTMENT_OTHER): Payer: Medicaid Other | Admitting: Hematology and Oncology

## 2017-12-23 ENCOUNTER — Inpatient Hospital Stay: Payer: Medicaid Other | Attending: Hematology and Oncology

## 2017-12-23 DIAGNOSIS — M542 Cervicalgia: Secondary | ICD-10-CM | POA: Insufficient documentation

## 2017-12-23 DIAGNOSIS — E039 Hypothyroidism, unspecified: Secondary | ICD-10-CM | POA: Diagnosis not present

## 2017-12-23 DIAGNOSIS — Z79899 Other long term (current) drug therapy: Secondary | ICD-10-CM | POA: Diagnosis not present

## 2017-12-23 DIAGNOSIS — C7800 Secondary malignant neoplasm of unspecified lung: Secondary | ICD-10-CM

## 2017-12-23 DIAGNOSIS — Z9889 Other specified postprocedural states: Secondary | ICD-10-CM | POA: Insufficient documentation

## 2017-12-23 DIAGNOSIS — G8929 Other chronic pain: Secondary | ICD-10-CM | POA: Diagnosis not present

## 2017-12-23 DIAGNOSIS — Z5112 Encounter for antineoplastic immunotherapy: Secondary | ICD-10-CM | POA: Diagnosis not present

## 2017-12-23 DIAGNOSIS — C760 Malignant neoplasm of head, face and neck: Secondary | ICD-10-CM

## 2017-12-23 DIAGNOSIS — Z923 Personal history of irradiation: Secondary | ICD-10-CM | POA: Diagnosis not present

## 2017-12-23 DIAGNOSIS — Z95828 Presence of other vascular implants and grafts: Secondary | ICD-10-CM

## 2017-12-23 LAB — CBC WITH DIFFERENTIAL/PLATELET
Basophils Absolute: 0 10*3/uL (ref 0.0–0.1)
Basophils Relative: 1 %
EOS PCT: 2 %
Eosinophils Absolute: 0.1 10*3/uL (ref 0.0–0.5)
HCT: 42.1 % (ref 38.4–49.9)
Hemoglobin: 14.2 g/dL (ref 13.0–17.1)
LYMPHS ABS: 1.1 10*3/uL (ref 0.9–3.3)
LYMPHS PCT: 20 %
MCH: 30.2 pg (ref 27.2–33.4)
MCHC: 33.7 g/dL (ref 32.0–36.0)
MCV: 89.6 fL (ref 79.3–98.0)
MONO ABS: 0.4 10*3/uL (ref 0.1–0.9)
MONOS PCT: 7 %
NEUTROS ABS: 4 10*3/uL (ref 1.5–6.5)
Neutrophils Relative %: 70 %
Platelets: 189 10*3/uL (ref 140–400)
RBC: 4.7 MIL/uL (ref 4.20–5.82)
RDW: 13.6 % (ref 11.0–14.6)
WBC: 5.6 10*3/uL (ref 4.0–10.3)

## 2017-12-23 LAB — COMPREHENSIVE METABOLIC PANEL
ALT: 36 U/L (ref 0–55)
ANION GAP: 8 (ref 3–11)
AST: 25 U/L (ref 5–34)
Albumin: 4.2 g/dL (ref 3.5–5.0)
Alkaline Phosphatase: 60 U/L (ref 40–150)
BUN: 14 mg/dL (ref 7–26)
CO2: 27 mmol/L (ref 22–29)
Calcium: 9.6 mg/dL (ref 8.4–10.4)
Chloride: 104 mmol/L (ref 98–109)
Creatinine, Ser: 0.9 mg/dL (ref 0.70–1.30)
Glucose, Bld: 112 mg/dL (ref 70–140)
POTASSIUM: 3.9 mmol/L (ref 3.5–5.1)
Sodium: 139 mmol/L (ref 136–145)
TOTAL PROTEIN: 7.2 g/dL (ref 6.4–8.3)
Total Bilirubin: 0.4 mg/dL (ref 0.2–1.2)

## 2017-12-23 LAB — TSH: TSH: 1.634 u[IU]/mL (ref 0.320–4.118)

## 2017-12-23 MED ORDER — DICLOFENAC SODIUM 50 MG PO TBEC: DELAYED_RELEASE_TABLET | ORAL | 1 refills | Status: DC

## 2017-12-23 MED ORDER — ANTICOAGULANT SODIUM CITRATE 4% (200MG/5ML) IV SOLN
5.0000 mL | Freq: Once | Status: AC
  Administered 2017-12-23: 5 mL
  Filled 2017-12-23: qty 5

## 2017-12-23 MED ORDER — SODIUM CHLORIDE 0.9% FLUSH
10.0000 mL | Freq: Once | INTRAVENOUS | Status: AC
  Administered 2017-12-23: 10 mL
  Filled 2017-12-23: qty 10

## 2017-12-23 MED ORDER — SODIUM CHLORIDE 0.9 % IV SOLN
Freq: Once | INTRAVENOUS | Status: AC
  Administered 2017-12-23: 14:00:00 via INTRAVENOUS

## 2017-12-23 MED ORDER — OXYCODONE HCL 15 MG PO TABS: 15.0000 mg | ORAL_TABLET | ORAL | 0 refills | Status: DC | PRN

## 2017-12-23 MED ORDER — SODIUM CHLORIDE 0.9 % IV SOLN
240.0000 mg | Freq: Once | INTRAVENOUS | Status: AC
  Administered 2017-12-23: 240 mg via INTRAVENOUS
  Filled 2017-12-23: qty 24

## 2017-12-23 NOTE — Assessment & Plan Note (Signed)
The patient has been compliant taking medications as directed I will continue to monitor TSH and adjust his thyroid medicine as needed

## 2017-12-23 NOTE — Progress Notes (Signed)
[facility] OFF[facility] PROGRESS NOTE  Patient Care Team: Patient, No Pcp Per as PCP - General (General Practice) [name], MD as Consulting Physician (Hematology and Oncology) Eppie Gibson, MD as Attending Physician (Radiation Oncology) Jodi Marble, MD as Consulting Physician (Otolaryngology) Philomena Doheny, MD as Referring Physician (Plastic Surgery) Irene Shipper, MD as Consulting Physician (Gastroenterology) System, Provider Not In  ASSESSMENT & PLAN:  Primary cancer of head and neck [facility]) He has intermittent right neck pain that comes and goes Clinically, his neck is thickened & woody from prior surgery and fibrosis from radiation Recent PET CT scan from 06/03/17 showed no evidence of disease progression Ultimately, we agreed to continue treatment as scheduled every month I will repeat imaging study again once a year, due in [place]   Acquired hypothyroidism The patient has been compliant taking medications as directed I will continue to monitor TSH and adjust his thyroid medicine as needed  Neck pain on right side The patient has significant fibrosis and scar tissue from prior surgery and radiation. I recommend continue conservative management with physical therapy, topical lidocaine, muscle relaxant and pain medicine as needed I have refilled his prescription of pain medicine   No orders of the defined types were placed in this encounter.   INTERVAL HISTORY: Please see below for problem oriented charting. 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 Pan-endoscopy elsewhere showed cancer from nasopharyngeal space.      01/04/2007 - 02/20/2007 Chemotherapy    He received 2 cycles of cisplatin and 5FU followed by concurrent chemo with weekly cisplatin and radiation. He only received 2 doses of chemo due to severe mucositis, nausea and weight loss.      04/05/2007 - 08/04/2007 Chemotherapy    He received 4 more courses of cisplatin with 5FU and had complete response      07/05/2009 Procedure    Fine-needle aspirate of the right level II lymph nodes come from recurrent metastatic disease. Repeat endoscopy and CT scan show no evidence of disease elsewhere.      07/08/2009 - 12/02/2009 Chemotherapy    He was given 6 cycles of carboplatin, 5-FU and docetaxel      12/03/2009 Surgery    He has surgery to the residual lymph node on the right neck which showed no evidence of disease.      02/22/2012 Imaging    Repeat imaging study showed large recurrent mass. He was referred elsewhere for further treatment.      05/03/2012 Surgery    He underwent left upper lobectomy.      04/29/2013 Imaging    PEt scan showed lesion on right level II B and lower lung was abnormal  06/03/2013 - 02/02/2014 Chemotherapy    He had 6 cycles of chemotherapy when he was found to have recurrence of cancer and had received oxaliplatin and capecitabine      06/07/2014 Imaging    PET CT scan showed persistent disease in the right neck lymph nodes and left lung      06/29/2014 Procedure    Accession: [id number] repeat LUL biopsy confirmed metastatic cancer      07/18/2014 - 07/31/2014 Radiation Therapy    He received palliative  radiation therapy to the lungs      10/10/2014 Imaging    CT scan of the chest, abdomen and pelvis show regression in the size of the lung nodule in the left upper lobe and stable pulmonary nodules      01/24/2015 Imaging    CT scan showed stable disease in neck and lung      06/19/2015 Imaging    CT scan of the neck and the chest show possible mild progression of the nodule in the right side of the neck.      06/25/2015 Imaging    PET scan confirmed disease recurrence in the neck      07/07/2015 Imaging    He had MRI neck at [facility]      09/03/2015 -  Chemotherapy    He received palliative chemo with Nivolumab      10/29/2015 Imaging    PET CT showed positive response to Rx      02/28/2016 Imaging    Ct abdomen showed abnormal thinkening in his stomach      03/03/2016 Imaging    CT: Right sternocleidomastoid muscle metastasis appears less distinct but otherwise not significantly changed in size or configuration since 06/19/2015.2. Left level 3 lymph node which was hypermetabolic by PET-CT in January 2017 appears slightly smaller      04/01/2016 Imaging    CT cervical spine showed no acute fracture or traumatic malalignment in the cervical spine      04/22/2016 Procedure    Port-a-cath placed.      06/16/2016 Imaging    Ct neck showed right sternocleidomastoid muscle metastasis is further decreased in conspicuity since May, and has mildly decreased in size since September 2016. Continued stability of sub-centimeter left cervical lymph nodes. No new or progressive metastatic disease in the neck.      06/16/2016 Imaging    CT chest showed stable masslike radiation fibrosis in the left upper lobe. Stable subcentimeter pulmonary nodules in the bilateral lower lobes. No new or progressive metastatic disease in the chest. Nonobstructing left renal stone.      10/13/2016 Imaging    Ct neck showed unchanged right sternocleidomastoid muscle metastasis. Unchanged subcentimeter left  cervical lymph nodes. No evidence of new or progressive metastatic disease in the neck.      10/13/2016 Imaging    CT chest showed tiny hypervascular foci in the liver, not definitely seen on prior imaging of 06/16/2016 and 02/28/2016. Abdomen MRI without and with contrast recommended to further evaluate as metastatic disease is a concern. 2. Stable appearance of post treatment changes left upper lung and scattered tiny bilateral pulmonary nodules.      02/11/2017 Imaging    Ct neck: Lymph node mass right posterior neck appears improved from the prior study. Small posterior lymph nodes on the left unchanged. Occluded right jugular vein unchanged.      02/11/2017 Imaging    1. Similar appearance of postsurgical and radiation changes in the left upper lobe. 2.  Similar bilateral pulmonary nodules. 3. No thoracic adenopathy. 4. Subtle foci of post-contrast enhancement within the liver are suboptimally characterized on this nondedicated study. Likely similar. These could either be re-evaluated at followup or more entirely characterized with abdominal MRI. 5. Left nephrolithiasis.      05/19/2017 Imaging    Matted lymph node mass right posterior neck appears larger in the recent CT. Accurate measurements difficult due to infiltrating tumor margins and infiltration of the muscle. Right jugular vein again appears occluded or resected. Small left posterior lymph nodes stable. Left upper lobe airspace density stable and similar to the prior CT      06/03/2017 PET scan    1. Hypermetabolic ill-defined right level IIb lymph node, about 1.3 cm in diameter with maximum SUV 9.5 (formerly 8.1). Appearance suspicious for residual/recurrent malignancy. No worrisome left-sided lesion. 2. Left suprahilar indistinct opacity demonstrates no worrisome hypermetabolic activity. The 5 mm left lower lobe pulmonary nodule is stable and not currently hypermetabolic although below sensitive PET-CT size thresholds. 3. Other  imaging findings of potential clinical significance: Bilateral nonobstructive nephrolithiasis. Chronic bilateral maxillary sinusitis.       REVIEW OF SYSTEMS:   Constitutional: Denies fevers, chills or abnormal weight loss Eyes: Denies blurriness of vision Ears, nose, mouth, throat, and face: Denies mucositis or sore throat Respiratory: Denies cough, dyspnea or wheezes Cardiovascular: Denies palpitation, chest discomfort or lower extremity swelling Gastrointestinal:  Denies nausea, heartburn or change in bowel habits Skin: Denies abnormal skin rashes Lymphatics: Denies new lymphadenopathy or easy bruising Neurological:Denies numbness, tingling or new weaknesses Behavioral/Psych: Mood is stable, no new changes  All other systems were reviewed with the patient and are negative.  I have reviewed the past medical history, past surgical history, social history and family history with the patient and they are unchanged from previous note.  ALLERGIES:  is allergic to heparin.  MEDICATIONS:  Current Outpatient Medications  Medication Sig Dispense Refill  . cyclobenzaprine (FLEXERIL) 5 MG tablet Take 1 tablet (5 mg total) by mouth 3 (three) times daily as needed for muscle spasms. 60 tablet 0  . diclofenac (VOLTAREN) 50 MG EC tablet TAKE 1 TABLET BY MOUTH 3 TIMES DAILY AS NEEDED FOR MILD PAIN. 60 tablet 1  . famotidine (PEPCID) 20 MG tablet Take 1 tablet (20 mg total) by mouth 2 (two) times daily. 30 tablet 0  . guaiFENesin-dextromethorphan (ROBITUSSIN DM) 100-10 MG/5ML syrup Take 5 mLs by mouth every 4 (four) hours as needed for cough. 473 mL 0  . levothyroxine (SYNTHROID, LEVOTHROID) 125 MCG tablet Take 1 tablet (125 mcg total) daily before breakfast by mouth. 30 tablet 9  . lidocaine (XYLOCAINE) 2 % solution 5 ml PO swish and spit every 3 hours as needed for mouth pain 100 mL 3  . lidocaine-prilocaine (EMLA) cream Apply 1 application topically as needed. (Patient taking differently: Apply 1  application topically as needed (for port access). ) 30 g 3  . magic mouthwash SOLN Take 5 mLs by mouth 4 (four) times daily as needed for mouth pain. Swish and swallow or spit for mouth pain 240 mL 5  . meclizine (ANTIVERT) 25 MG tablet Take 1 tablet (25 mg total) by mouth 3 (three) times daily as needed for dizziness. 30 tablet 0  . naproxen (NAPROSYN) 500 MG tablet Take 1 tablet (500 mg total) by mouth 2 (two) times daily as needed. 30 tablet 0  . omeprazole (PRILOSEC) 40 MG capsule Take 1 capsule (40 mg total) by mouth daily. 30 capsule  5  . oxyCODONE (ROXICODONE) 15 MG immediate release tablet Take 1 tablet (15 mg total) by mouth every 4 (four) hours as needed for pain. 60 tablet 0   No current facility-administered medications for this visit.    Facility-Administered Medications Ordered in Other Visits  Medication Dose Route Frequency Provider Last Rate Last Dose  . sodium chloride 0.9 % injection 10 mL  10 mL Intracatheter PRN Alvy Bimler, Uva Runkel, MD        PHYSICAL EXAMINATION: ECOG PERFORMANCE STATUS: 1 - Symptomatic but completely ambulatory  Vitals:   12/23/17 1344  BP: 137/79  Pulse: 73  Resp: 18  Temp: 97.7 F (36.5 C)  SpO2: 100%   Filed Weights   12/23/17 1344  Weight: 180 lb 1.6 oz (81.7 kg)    GENERAL:alert, no distress and comfortable SKIN: skin color, texture, turgor are normal, no rashes or significant lesions EYES: normal, Conjunctiva are pink and non-injected, sclera clear OROPHARYNX:no exudate, no erythema and lips, buccal mucosa, and tongue normal  NECK: supple, thyroid normal size, non-tender, without nodularity LYMPH:  no palpable lymphadenopathy in the cervical, axillary or inguinal LUNGS: clear to auscultation and percussion with normal breathing effort HEART: regular rate & rhythm and no murmurs and no lower extremity edema ABDOMEN:abdomen soft, non-tender and normal bowel sounds Musculoskeletal:no cyanosis of digits and no clubbing  NEURO: alert &  oriented x 3 with fluent speech, no focal motor/sensory deficits  LABORATORY DATA:  I have reviewed the data as listed    Component Value Date/Time   NA 139 12/23/2017 1250   NA 139 09/22/2017 0829   K 3.9 12/23/2017 1250   K 3.5 09/22/2017 0829   CL 104 12/23/2017 1250   CO2 27 12/23/2017 1250   CO2 26 09/22/2017 0829   GLUCOSE 112 12/23/2017 1250   GLUCOSE 133 09/22/2017 0829   BUN 14 12/23/2017 1250   BUN 14.1 09/22/2017 0829   CREATININE 0.90 12/23/2017 1250   CREATININE 0.9 09/22/2017 0829   CALCIUM 9.6 12/23/2017 1250   CALCIUM 9.1 09/22/2017 0829   PROT 7.2 12/23/2017 1250   PROT 6.8 09/22/2017 0829   ALBUMIN 4.2 12/23/2017 1250   ALBUMIN 4.1 09/22/2017 0829   AST 25 12/23/2017 1250   AST 22 09/22/2017 0829   ALT 36 12/23/2017 1250   ALT 30 09/22/2017 0829   ALKPHOS 60 12/23/2017 1250   ALKPHOS 55 09/22/2017 0829   BILITOT 0.4 12/23/2017 1250   BILITOT 0.35 09/22/2017 0829   GFRNONAA >60 12/23/2017 1250   GFRAA >60 12/23/2017 1250    No results found for: SPEP, UPEP  Lab Results  Component Value Date   WBC 5.6 12/23/2017   NEUTROABS 4.0 12/23/2017   HGB 14.2 12/23/2017   HCT 42.1 12/23/2017   MCV 89.6 12/23/2017   PLT 189 12/23/2017      Chemistry      Component Value Date/Time   NA 139 12/23/2017 1250   NA 139 09/22/2017 0829   K 3.9 12/23/2017 1250   K 3.5 09/22/2017 0829   CL 104 12/23/2017 1250   CO2 27 12/23/2017 1250   CO2 26 09/22/2017 0829   BUN 14 12/23/2017 1250   BUN 14.1 09/22/2017 0829   CREATININE 0.90 12/23/2017 1250   CREATININE 0.9 09/22/2017 0829      Component Value Date/Time   CALCIUM 9.6 12/23/2017 1250   CALCIUM 9.1 09/22/2017 0829   ALKPHOS 60 12/23/2017 1250   ALKPHOS 55 09/22/2017 0829   AST 25 12/23/2017 1250  AST 22 09/22/2017 0829   ALT 36 12/23/2017 1250   ALT 30 09/22/2017 0829   BILITOT 0.4 12/23/2017 1250   BILITOT 0.35 09/22/2017 0829       RADIOGRAPHIC STUDIES: I have personally reviewed the  radiological images as listed and agreed with the findings in the report. No results found.  All questions were answered. The patient knows to call the clinic with any problems, questions or concerns. No barriers to learning was detected.  I spent 15 minutes counseling the patient face to face. The total time spent in the appointment was 20 minutes and more than 50% was on counseling and review of test results  [name], MD 12/23/2017 3:25 PM

## 2017-12-23 NOTE — Progress Notes (Signed)
Received PA request for Diclofenac sodium 50MG  DR tablets.  Called [facility] [employer]([place]) to initiate PA. Answered questions.  PA approved 12/23/17 - 12/18/18 WN#[id number] interaction id [id number].

## 2017-12-23 NOTE — Patient Instructions (Signed)
[facility] Discharge Instructions for Patients Receiving Chemotherapy  Today you received the following chemotherapy agents: Nivolumab  To help prevent nausea and vomiting after your treatment, we encourage you to take your nausea medication as directed.    If you develop nausea and vomiting that is not controlled by your nausea medication, call the clinic.   BELOW ARE SYMPTOMS THAT SHOULD BE REPORTED IMMEDIATELY:  *FEVER GREATER THAN 100.5 F  *CHILLS WITH OR WITHOUT FEVER  NAUSEA AND VOMITING THAT IS NOT CONTROLLED WITH YOUR NAUSEA MEDICATION  *UNUSUAL SHORTNESS OF BREATH  *UNUSUAL BRUISING OR BLEEDING  TENDERNESS IN MOUTH AND THROAT WITH OR WITHOUT PRESENCE OF ULCERS  *URINARY PROBLEMS  *BOWEL PROBLEMS  UNUSUAL RASH Items with * indicate a potential emergency and should be followed up as soon as possible.  Feel free to call the clinic should you have any questions or concerns. The clinic phone number is [phone].  Please show the CHEMO ALERT CARD at check-in to the Emergency Department and triage nurse.   

## 2017-12-23 NOTE — Assessment & Plan Note (Signed)
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 I recommend continue conservative management with physical therapy, topical lidocaine, muscle relaxant and pain medicine as needed I have refilled his prescription of pain medicine

## 2017-12-23 NOTE — Assessment & Plan Note (Signed)
He has intermittent right neck pain that comes and goes Clinically, his neck is thickened & woody from prior surgery and fibrosis from radiation Recent PET CT scan from 06/03/17 showed no evidence of disease progression Ultimately, we agreed to continue treatment as scheduled every month I will repeat imaging study again once a year, due in [place]

## 2018-01-19 ENCOUNTER — Other Ambulatory Visit: Payer: Self-pay | Admitting: Hematology and Oncology

## 2018-01-20 ENCOUNTER — Inpatient Hospital Stay: Payer: Medicaid Other

## 2018-01-20 ENCOUNTER — Other Ambulatory Visit: Payer: Self-pay | Admitting: Hematology and Oncology

## 2018-01-20 ENCOUNTER — Inpatient Hospital Stay (HOSPITAL_BASED_OUTPATIENT_CLINIC_OR_DEPARTMENT_OTHER): Payer: Medicaid Other | Admitting: Hematology and Oncology

## 2018-01-20 ENCOUNTER — Encounter: Payer: Self-pay | Admitting: Hematology and Oncology

## 2018-01-20 ENCOUNTER — Inpatient Hospital Stay: Payer: Medicaid Other | Attending: Hematology and Oncology

## 2018-01-20 VITALS — BP 126/82 | HR 77 | Temp 98.0°F | Resp 18 | Ht 68.75 in | Wt 180.9 lb

## 2018-01-20 DIAGNOSIS — E039 Hypothyroidism, unspecified: Secondary | ICD-10-CM | POA: Diagnosis not present

## 2018-01-20 DIAGNOSIS — Z5112 Encounter for antineoplastic immunotherapy: Secondary | ICD-10-CM | POA: Insufficient documentation

## 2018-01-20 DIAGNOSIS — C7800 Secondary malignant neoplasm of unspecified lung: Secondary | ICD-10-CM

## 2018-01-20 DIAGNOSIS — C760 Malignant neoplasm of head, face and neck: Secondary | ICD-10-CM

## 2018-01-20 DIAGNOSIS — Z79899 Other long term (current) drug therapy: Secondary | ICD-10-CM | POA: Diagnosis not present

## 2018-01-20 DIAGNOSIS — M542 Cervicalgia: Secondary | ICD-10-CM | POA: Diagnosis not present

## 2018-01-20 DIAGNOSIS — R0789 Other chest pain: Secondary | ICD-10-CM

## 2018-01-20 DIAGNOSIS — Z95828 Presence of other vascular implants and grafts: Secondary | ICD-10-CM

## 2018-01-20 DIAGNOSIS — R42 Dizziness and giddiness: Secondary | ICD-10-CM

## 2018-01-20 LAB — CBC WITH DIFFERENTIAL/PLATELET
BASOS ABS: 0 10*3/uL (ref 0.0–0.1)
BASOS PCT: 0 %
Eosinophils Absolute: 0.2 10*3/uL (ref 0.0–0.5)
Eosinophils Relative: 3 %
HCT: 42.4 % (ref 38.4–49.9)
HEMOGLOBIN: 14.3 g/dL (ref 13.0–17.1)
Lymphocytes Relative: 30 %
Lymphs Abs: 1.6 10*3/uL (ref 0.9–3.3)
MCH: 30.2 pg (ref 27.2–33.4)
MCHC: 33.7 g/dL (ref 32.0–36.0)
MCV: 89.6 fL (ref 79.3–98.0)
Monocytes Absolute: 0.5 10*3/uL (ref 0.1–0.9)
Monocytes Relative: 10 %
NEUTROS PCT: 57 %
Neutro Abs: 2.9 10*3/uL (ref 1.5–6.5)
PLATELETS: 245 10*3/uL (ref 140–400)
RBC: 4.73 MIL/uL (ref 4.20–5.82)
RDW: 13.7 % (ref 11.0–14.6)
WBC: 5.2 10*3/uL (ref 4.0–10.3)

## 2018-01-20 LAB — COMPREHENSIVE METABOLIC PANEL
ALBUMIN: 4.3 g/dL (ref 3.5–5.0)
ALK PHOS: 62 U/L (ref 40–150)
ALT: 46 U/L (ref 0–55)
AST: 34 U/L (ref 5–34)
Anion gap: 7 (ref 3–11)
BUN: 18 mg/dL (ref 7–26)
CALCIUM: 10 mg/dL (ref 8.4–10.4)
CHLORIDE: 104 mmol/L (ref 98–109)
CO2: 29 mmol/L (ref 22–29)
CREATININE: 0.93 mg/dL (ref 0.70–1.30)
GFR calc Af Amer: >60 mL/min (ref >60)
GFR calc non Af Amer: >60 mL/min (ref >60)
GLUCOSE: 95 mg/dL (ref 70–140)
Potassium: 4.3 mmol/L (ref 3.5–5.1)
SODIUM: 140 mmol/L (ref 136–145)
Total Bilirubin: 0.3 mg/dL (ref 0.2–1.2)
Total Protein: 7.3 g/dL (ref 6.4–8.3)

## 2018-01-20 LAB — TSH: TSH: 0.509 u[IU]/mL (ref 0.320–4.118)

## 2018-01-20 MED ORDER — SODIUM CHLORIDE 0.9 % IV SOLN
240.0000 mg | Freq: Once | INTRAVENOUS | Status: AC
  Administered 2018-01-20: 240 mg via INTRAVENOUS
  Filled 2018-01-20: qty 24

## 2018-01-20 MED ORDER — ANTICOAGULANT SODIUM CITRATE 4% (200MG/5ML) IV SOLN
5.0000 mL | Freq: Once | Status: AC
  Administered 2018-01-20: 5 mL via INTRAVENOUS
  Filled 2018-01-20: qty 5

## 2018-01-20 MED ORDER — SODIUM CHLORIDE 0.9 % IJ SOLN
10.0000 mL | INTRAMUSCULAR | Status: DC | PRN
  Administered 2018-01-20: 10 mL
  Filled 2018-01-20: qty 10

## 2018-01-20 MED ORDER — SODIUM CHLORIDE 0.9% FLUSH
10.0000 mL | Freq: Once | INTRAVENOUS | Status: AC
  Administered 2018-01-20: 10 mL
  Filled 2018-01-20: qty 10

## 2018-01-20 NOTE — Assessment & Plan Note (Signed)
He has intermittent vertigo and occasional dizziness I recommend he takes meclizine as needed

## 2018-01-20 NOTE — Assessment & Plan Note (Signed)
His symptoms of intermittent chest wall pain is likely related to prior surgery.  The patient is reassured His last imaging showed no evidence of disease progression I plan to repeat CT chest again next year

## 2018-01-20 NOTE — Assessment & Plan Note (Signed)
He has intermittent right neck pain that comes and goes Clinically, his neck is thickened & woody from prior surgery and fibrosis from radiation Last PET CT scan from 06/03/17 showed no evidence of disease progression Ultimately, we agreed to continue treatment as scheduled every month I will repeat imaging study again once a year, due in [place]

## 2018-01-20 NOTE — Patient Instructions (Signed)
[facility] Discharge Instructions for Patients Receiving Chemotherapy  Today you received the following chemotherapy agents: Nivolumab  To help prevent nausea and vomiting after your treatment, we encourage you to take your nausea medication as directed.    If you develop nausea and vomiting that is not controlled by your nausea medication, call the clinic.   BELOW ARE SYMPTOMS THAT SHOULD BE REPORTED IMMEDIATELY:  *FEVER GREATER THAN 100.5 F  *CHILLS WITH OR WITHOUT FEVER  NAUSEA AND VOMITING THAT IS NOT CONTROLLED WITH YOUR NAUSEA MEDICATION  *UNUSUAL SHORTNESS OF BREATH  *UNUSUAL BRUISING OR BLEEDING  TENDERNESS IN MOUTH AND THROAT WITH OR WITHOUT PRESENCE OF ULCERS  *URINARY PROBLEMS  *BOWEL PROBLEMS  UNUSUAL RASH Items with * indicate a potential emergency and should be followed up as soon as possible.  Feel free to call the clinic should you have any questions or concerns. The clinic phone number is [phone].  Please show the CHEMO ALERT CARD at check-in to the Emergency Department and triage nurse.   

## 2018-01-20 NOTE — Progress Notes (Signed)
[place] OFFICE PROGRESS NOTE  Patient Care Team: Patient, No Pcp Per as PCP - General (General Practice) [name], MD as Consulting Physician (Hematology and Oncology) Eppie Gibson, MD as Attending Physician (Radiation Oncology) Jodi Marble, MD as Consulting Physician (Otolaryngology) Philomena Doheny, MD as Referring Physician (Plastic Surgery) Irene Shipper, MD as Consulting Physician (Gastroenterology) System, Provider Not In  ASSESSMENT & PLAN:  Primary cancer of head and neck [facility]) He has intermittent right neck pain that comes and goes Clinically, his neck is thickened & woody from prior surgery and fibrosis from radiation Last PET CT scan from 06/03/17 showed no evidence of disease progression Ultimately, we agreed to continue treatment as scheduled every month I will repeat imaging study again once a year, due in [place]   Acquired hypothyroidism The patient has been compliant taking medications as directed I will continue to monitor TSH and adjust his thyroid medicine as needed  Metastasis to lung His symptoms of intermittent chest wall pain is likely related to prior surgery.  The patient is reassured His last imaging showed no evidence of disease progression I plan to repeat CT chest again next year  Vertigo, intermittent He has intermittent vertigo and occasional dizziness I recommend he takes meclizine as needed   Orders Placed This Encounter  Procedures  . TSH    Standing Status:   Standing    Number of Occurrences:   11    Standing Expiration Date:   01/21/2019    INTERVAL HISTORY: Please see below for problem oriented charting. 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 Pan-endoscopy elsewhere showed cancer from nasopharyngeal space.      01/04/2007 - 02/20/2007 Chemotherapy    He received 2 cycles of cisplatin and 5FU followed by concurrent chemo with weekly cisplatin and radiation. He only received 2 doses of chemo due to severe mucositis, nausea and weight loss.      04/05/2007 - 08/04/2007 Chemotherapy    He received 4 more courses of cisplatin with 5FU and had complete response      07/05/2009 Procedure    Fine-needle aspirate of the right level II lymph nodes come from recurrent metastatic disease. Repeat endoscopy and CT scan show no evidence of disease elsewhere.      07/08/2009 - 12/02/2009 Chemotherapy    He was given 6 cycles of carboplatin, 5-FU and docetaxel      12/03/2009 Surgery    He has surgery to the residual lymph node on the right neck which showed no evidence of disease.      02/22/2012 Imaging    Repeat imaging study showed large recurrent mass. He was referred elsewhere for further treatment.      05/03/2012 Surgery    He underwent left  upper lobectomy.      04/29/2013 Imaging    PEt scan showed lesion on right level II B and lower lung was abnormal      06/03/2013 - 02/02/2014 Chemotherapy    He had 6 cycles of chemotherapy when he was found to have recurrence of cancer and had received oxaliplatin and capecitabine      06/07/2014 Imaging    PET CT scan showed persistent disease in the right neck lymph nodes and left lung      06/29/2014 Procedure    Accession: [id number] repeat LUL biopsy confirmed  metastatic cancer      07/18/2014 - 07/31/2014 Radiation Therapy    He received palliative radiation therapy to the lungs      10/10/2014 Imaging    CT scan of the chest, abdomen and pelvis show regression in the size of the lung nodule in the left upper lobe and stable pulmonary nodules      01/24/2015 Imaging    CT scan showed stable disease in neck and lung      06/19/2015 Imaging    CT scan of the neck and the chest show possible mild progression of the nodule in the right side of the neck.      06/25/2015 Imaging    PET scan confirmed disease recurrence in the neck      07/07/2015 Imaging    He had MRI neck at [facility]      09/03/2015 -  Chemotherapy    He received palliative chemo with Nivolumab      10/29/2015 Imaging    PET CT showed positive response to Rx      02/28/2016 Imaging    Ct abdomen showed abnormal thinkening in his stomach      03/03/2016 Imaging    CT: Right sternocleidomastoid muscle metastasis appears less distinct but otherwise not significantly changed in size or configuration since 06/19/2015.2. Left level 3 lymph node which was hypermetabolic by PET-CT in January 2017 appears slightly smaller      04/01/2016 Imaging    CT cervical spine showed no acute fracture or traumatic malalignment in the cervical spine      04/22/2016 Procedure    Port-a-cath placed.      06/16/2016 Imaging    Ct neck showed right sternocleidomastoid muscle metastasis is further decreased in conspicuity since May, and has mildly decreased in size since September 2016. Continued stability of sub-centimeter left cervical lymph nodes. No new or progressive metastatic disease in the neck.      06/16/2016 Imaging    CT chest showed stable masslike radiation fibrosis in the left upper lobe. Stable subcentimeter pulmonary nodules in the bilateral lower lobes. No new or progressive metastatic disease in the chest. Nonobstructing left renal stone.      10/13/2016 Imaging    Ct neck  showed unchanged right sternocleidomastoid muscle metastasis. Unchanged subcentimeter left cervical lymph nodes. No evidence of new or progressive metastatic disease in the neck.      10/13/2016 Imaging    CT chest showed tiny hypervascular foci in the liver, not definitely seen on prior imaging of 06/16/2016 and 02/28/2016. Abdomen MRI without and with contrast recommended to further evaluate as metastatic disease is a concern. 2. Stable appearance of post treatment changes left upper lung and scattered tiny bilateral pulmonary nodules.      02/11/2017 Imaging    Ct neck: Lymph node mass right posterior neck appears improved from the prior study. Small posterior lymph nodes on the  left unchanged. Occluded right jugular vein unchanged.      02/11/2017 Imaging    1. Similar appearance of postsurgical and radiation changes in the left upper lobe. 2. Similar bilateral pulmonary nodules. 3. No thoracic adenopathy. 4. Subtle foci of post-contrast enhancement within the liver are suboptimally characterized on this nondedicated study. Likely similar. These could either be re-evaluated at followup or more entirely characterized with abdominal MRI. 5. Left nephrolithiasis.      05/19/2017 Imaging    Matted lymph node mass right posterior neck appears larger in the recent CT. Accurate measurements difficult due to infiltrating tumor margins and infiltration of the muscle. Right jugular vein again appears occluded or resected. Small left posterior lymph nodes stable. Left upper lobe airspace density stable and similar to the prior CT      06/03/2017 PET scan    1. Hypermetabolic ill-defined right level IIb lymph node, about 1.3 cm in diameter with maximum SUV 9.5 (formerly 8.1). Appearance suspicious for residual/recurrent malignancy. No worrisome left-sided lesion. 2. Left suprahilar indistinct opacity demonstrates no worrisome hypermetabolic activity. The 5 mm left lower lobe pulmonary nodule is stable  and not currently hypermetabolic although below sensitive PET-CT size thresholds. 3. Other imaging findings of potential clinical significance: Bilateral nonobstructive nephrolithiasis. Chronic bilateral maxillary sinusitis.       REVIEW OF SYSTEMS:   Constitutional: Denies fevers, chills or abnormal weight loss Eyes: Denies blurriness of vision Ears, nose, mouth, throat, and face: Denies mucositis or sore throat Respiratory: Denies cough, dyspnea or wheezes Cardiovascular: Denies palpitation, chest discomfort or lower extremity swelling Gastrointestinal:  Denies nausea, heartburn or change in bowel habits Skin: Denies abnormal skin rashes Lymphatics: Denies new lymphadenopathy or easy bruising Neurological:Denies numbness, tingling or new weaknesses Behavioral/Psych: Mood is stable, no new changes  All other systems were reviewed with the patient and are negative.  I have reviewed the past medical history, past surgical history, social history and family history with the patient and they are unchanged from previous note.  ALLERGIES:  is allergic to heparin.  MEDICATIONS:  Current Outpatient Medications  Medication Sig Dispense Refill  . cyclobenzaprine (FLEXERIL) 5 MG tablet Take 1 tablet (5 mg total) by mouth 3 (three) times daily as needed for muscle spasms. 60 tablet 0  . diclofenac (VOLTAREN) 50 MG EC tablet TAKE 1 TABLET BY MOUTH 3 TIMES DAILY AS NEEDED FOR MILD PAIN. 60 tablet 1  . famotidine (PEPCID) 20 MG tablet Take 1 tablet (20 mg total) by mouth 2 (two) times daily. 30 tablet 0  . levothyroxine (SYNTHROID, LEVOTHROID) 125 MCG tablet Take 1 tablet (125 mcg total) daily before breakfast by mouth. 30 tablet 9  . lidocaine-prilocaine (EMLA) cream Apply 1 application topically as needed. (Patient taking differently: Apply 1 application topically as needed (for port access). ) 30 g 3  . meclizine (ANTIVERT) 25 MG tablet Take 1 tablet (25 mg total) by mouth 3 (three) times daily  as needed for dizziness. 30 tablet 0  . naproxen (NAPROSYN) 500 MG tablet Take 1 tablet (500 mg total) by mouth 2 (two) times daily as needed. 30 tablet 0  . omeprazole (PRILOSEC) 40 MG capsule Take 1 capsule (40 mg total) by mouth daily. 30 capsule 5  . oxyCODONE (ROXICODONE) 15 MG immediate release tablet Take 1 tablet (15 mg total) by mouth every 4 (four) hours as needed for pain. 60 tablet 0   No current facility-administered medications for this visit.    Facility-Administered Medications Ordered in Other Visits  Medication Dose Route Frequency Provider Last Rate Last Dose  . sodium chloride 0.9 % injection 10 mL  10 mL Intracatheter PRN Alvy Bimler, Breon Rehm, MD        PHYSICAL EXAMINATION: ECOG PERFORMANCE STATUS: 1 - Symptomatic but completely ambulatory  Vitals:   01/20/18 1257  BP: 126/82  Pulse: 77  Resp: 18  Temp: 98 F (36.7 C)  SpO2: 100%   Filed Weights   01/20/18 1257  Weight: 180 lb 14.4 oz (82.1 kg)    GENERAL:alert, no distress and comfortable SKIN: skin color, texture, turgor are normal, no rashes or significant lesions EYES: normal, Conjunctiva are pink and non-injected, sclera clear OROPHARYNX:no exudate, no erythema and lips, buccal mucosa, and tongue normal  NECK: Well-healed surgical scar.  His neck is woody from prior surgery and radiation  lYMPH:  no palpable lymphadenopathy in the cervical, axillary or inguinal LUNGS: clear to auscultation and percussion with normal breathing effort HEART: regular rate & rhythm and no murmurs and no lower extremity edema ABDOMEN:abdomen soft, non-tender and normal bowel sounds Musculoskeletal:no cyanosis of digits and no clubbing  NEURO: alert & oriented x 3 with fluent speech, no focal motor/sensory deficits  LABORATORY DATA:  I have reviewed the data as listed    Component Value Date/Time   NA 139 12/23/2017 1250   NA 139 09/22/2017 0829   K 3.9 12/23/2017 1250   K 3.5 09/22/2017 0829   CL 104 12/23/2017 1250    CO2 27 12/23/2017 1250   CO2 26 09/22/2017 0829   GLUCOSE 112 12/23/2017 1250   GLUCOSE 133 09/22/2017 0829   BUN 14 12/23/2017 1250   BUN 14.1 09/22/2017 0829   CREATININE 0.90 12/23/2017 1250   CREATININE 0.9 09/22/2017 0829   CALCIUM 9.6 12/23/2017 1250   CALCIUM 9.1 09/22/2017 0829   PROT 7.2 12/23/2017 1250   PROT 6.8 09/22/2017 0829   ALBUMIN 4.2 12/23/2017 1250   ALBUMIN 4.1 09/22/2017 0829   AST 25 12/23/2017 1250   AST 22 09/22/2017 0829   ALT 36 12/23/2017 1250   ALT 30 09/22/2017 0829   ALKPHOS 60 12/23/2017 1250   ALKPHOS 55 09/22/2017 0829   BILITOT 0.4 12/23/2017 1250   BILITOT 0.35 09/22/2017 0829   GFRNONAA >60 12/23/2017 1250   GFRAA >60 12/23/2017 1250    No results found for: SPEP, UPEP  Lab Results  Component Value Date   WBC 5.6 12/23/2017   NEUTROABS 4.0 12/23/2017   HGB 14.2 12/23/2017   HCT 42.1 12/23/2017   MCV 89.6 12/23/2017   PLT 189 12/23/2017      Chemistry      Component Value Date/Time   NA 139 12/23/2017 1250   NA 139 09/22/2017 0829   K 3.9 12/23/2017 1250   K 3.5 09/22/2017 0829   CL 104 12/23/2017 1250   CO2 27 12/23/2017 1250   CO2 26 09/22/2017 0829   BUN 14 12/23/2017 1250   BUN 14.1 09/22/2017 0829   CREATININE 0.90 12/23/2017 1250   CREATININE 0.9 09/22/2017 0829      Component Value Date/Time   CALCIUM 9.6 12/23/2017 1250   CALCIUM 9.1 09/22/2017 0829   ALKPHOS 60 12/23/2017 1250   ALKPHOS 55 09/22/2017 0829   AST 25 12/23/2017 1250   AST 22 09/22/2017 0829   ALT 36 12/23/2017 1250   ALT 30 09/22/2017 0829   BILITOT 0.4 12/23/2017 1250   BILITOT 0.35 09/22/2017 0829       All questions were answered. The patient  knows to call the clinic with any problems, questions or concerns. No barriers to learning was detected.  I spent 15 minutes counseling the patient face to face. The total time spent in the appointment was 20 minutes and more than 50% was on counseling and review of test results  [name],  MD 01/20/2018 1:01 PM

## 2018-01-20 NOTE — Assessment & Plan Note (Signed)
The patient has been compliant taking medications as directed I will continue to monitor TSH and adjust his thyroid medicine as needed

## 2018-02-17 ENCOUNTER — Inpatient Hospital Stay: Payer: Medicaid Other

## 2018-02-17 ENCOUNTER — Inpatient Hospital Stay: Payer: Medicaid Other | Attending: Hematology and Oncology

## 2018-02-17 ENCOUNTER — Encounter: Payer: Self-pay | Admitting: Hematology and Oncology

## 2018-02-17 ENCOUNTER — Telehealth: Payer: Self-pay | Admitting: Hematology and Oncology

## 2018-02-17 ENCOUNTER — Inpatient Hospital Stay (HOSPITAL_BASED_OUTPATIENT_CLINIC_OR_DEPARTMENT_OTHER): Payer: Medicaid Other | Admitting: Hematology and Oncology

## 2018-02-17 DIAGNOSIS — C760 Malignant neoplasm of head, face and neck: Secondary | ICD-10-CM

## 2018-02-17 DIAGNOSIS — K5909 Other constipation: Secondary | ICD-10-CM

## 2018-02-17 DIAGNOSIS — M542 Cervicalgia: Secondary | ICD-10-CM | POA: Diagnosis not present

## 2018-02-17 DIAGNOSIS — C7800 Secondary malignant neoplasm of unspecified lung: Secondary | ICD-10-CM

## 2018-02-17 DIAGNOSIS — Z5112 Encounter for antineoplastic immunotherapy: Secondary | ICD-10-CM | POA: Diagnosis present

## 2018-02-17 DIAGNOSIS — E039 Hypothyroidism, unspecified: Secondary | ICD-10-CM

## 2018-02-17 DIAGNOSIS — Z95828 Presence of other vascular implants and grafts: Secondary | ICD-10-CM

## 2018-02-17 LAB — COMPREHENSIVE METABOLIC PANEL
ALK PHOS: 55 U/L (ref 40–150)
ALT: 39 U/L (ref 0–55)
ANION GAP: 9 (ref 3–11)
AST: 27 U/L (ref 5–34)
Albumin: 4.3 g/dL (ref 3.5–5.0)
BILIRUBIN TOTAL: 0.3 mg/dL (ref 0.2–1.2)
BUN: 14 mg/dL (ref 7–26)
CALCIUM: 9.1 mg/dL (ref 8.4–10.4)
CO2: 25 mmol/L (ref 22–29)
CREATININE: 0.98 mg/dL (ref 0.70–1.30)
Chloride: 105 mmol/L (ref 98–109)
Glucose, Bld: 132 mg/dL (ref 70–140)
Potassium: 3.5 mmol/L (ref 3.5–5.1)
Sodium: 139 mmol/L (ref 136–145)
TOTAL PROTEIN: 6.8 g/dL (ref 6.4–8.3)

## 2018-02-17 LAB — CBC WITH DIFFERENTIAL/PLATELET
BASOS ABS: 0 10*3/uL (ref 0.0–0.1)
Basophils Relative: 1 %
Eosinophils Absolute: 0.2 10*3/uL (ref 0.0–0.5)
Eosinophils Relative: 4 %
HEMATOCRIT: 40.1 % (ref 38.4–49.9)
Hemoglobin: 13.8 g/dL (ref 13.0–17.1)
LYMPHS ABS: 1.5 10*3/uL (ref 0.9–3.3)
LYMPHS PCT: 38 %
MCH: 30.1 pg (ref 27.2–33.4)
MCHC: 34.3 g/dL (ref 32.0–36.0)
MCV: 87.8 fL (ref 79.3–98.0)
Monocytes Absolute: 0.4 10*3/uL (ref 0.1–0.9)
Monocytes Relative: 11 %
NEUTROS ABS: 1.9 10*3/uL (ref 1.5–6.5)
Neutrophils Relative %: 46 %
Platelets: 194 10*3/uL (ref 140–400)
RBC: 4.56 MIL/uL (ref 4.20–5.82)
RDW: 14.1 % (ref 11.0–14.6)
WBC: 4 10*3/uL (ref 4.0–10.3)

## 2018-02-17 LAB — TSH: TSH: 4.103 u[IU]/mL (ref 0.320–4.118)

## 2018-02-17 MED ORDER — OXYCODONE HCL 15 MG PO TABS: 15.0000 mg | ORAL_TABLET | ORAL | 0 refills | Status: DC | PRN

## 2018-02-17 MED ORDER — ANTICOAGULANT SODIUM CITRATE 4% (200MG/5ML) IV SOLN
5.0000 mL | Freq: Once | Status: AC
  Administered 2018-02-17: 5 mL via INTRAVENOUS
  Filled 2018-02-17: qty 5

## 2018-02-17 MED ORDER — DICLOFENAC SODIUM 50 MG PO TBEC: DELAYED_RELEASE_TABLET | ORAL | 1 refills | Status: DC

## 2018-02-17 MED ORDER — SODIUM CHLORIDE 0.9% FLUSH
10.0000 mL | Freq: Once | INTRAVENOUS | Status: AC
  Administered 2018-02-17: 10 mL
  Filled 2018-02-17: qty 10

## 2018-02-17 MED ORDER — SODIUM CHLORIDE 0.9 % IJ SOLN
10.0000 mL | INTRAMUSCULAR | Status: DC | PRN
  Administered 2018-02-17: 10 mL
  Filled 2018-02-17: qty 10

## 2018-02-17 MED ORDER — SODIUM CHLORIDE 0.9 % IV SOLN
240.0000 mg | Freq: Once | INTRAVENOUS | Status: AC
  Administered 2018-02-17: 240 mg via INTRAVENOUS
  Filled 2018-02-17: qty 24

## 2018-02-17 MED ORDER — SODIUM CHLORIDE 0.9 % IV SOLN
Freq: Once | INTRAVENOUS | Status: AC
  Administered 2018-02-17: 10:00:00 via INTRAVENOUS

## 2018-02-17 NOTE — Progress Notes (Signed)
[place] OFFICE PROGRESS NOTE  Patient Care Team: Patient, No Pcp Per as PCP - General (General Practice) [name], MD as Consulting Physician (Hematology and Oncology) Eppie Gibson, MD as Attending Physician (Radiation Oncology) Jodi Marble, MD as Consulting Physician (Otolaryngology) Philomena Doheny, MD as Referring Physician (Plastic Surgery) Irene Shipper, MD as Consulting Physician (Gastroenterology) System, Provider Not In  ASSESSMENT & PLAN:  Primary cancer of head and neck [facility]) He has intermittent right neck pain that comes and goes Clinically, his neck is thickened & woody from prior surgery and fibrosis from radiation Last PET CT scan from 06/03/17 showed no evidence of disease progression Ultimately, we agreed to continue treatment as scheduled every month I will repeat imaging study again once a year, due in August 2019   Metastasis to lung His symptoms of intermittent chest wall pain is likely related to prior surgery.  The patient is reassured His last imaging showed no evidence of disease progression I plan to repeat CT chest again next year  Acquired hypothyroidism The patient has been compliant taking medications as directed I will continue to monitor TSH and adjust his thyroid medicine as needed  Neck pain on right side The patient has significant fibrosis and scar tissue from prior surgery and radiation. I recommend continue conservative management with physical therapy, topical lidocaine, muscle relaxant and pain medicine as needed I have refilled his prescription of pain medicine The patient is taking diclofenac and I recommend not more than twice a day and to be taken with food if possible  Other constipation He has chronic constipation likely due to his fasting and pain medicine We discussed laxative management   No orders of the defined types were placed in this encounter.   INTERVAL HISTORY: Please see below for problem  oriented charting. 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 Pan-endoscopy elsewhere showed cancer from nasopharyngeal space.      01/04/2007 - 02/20/2007 Chemotherapy    He received 2 cycles of cisplatin and 5FU followed by concurrent chemo with weekly cisplatin and radiation. He only received 2 doses of chemo due to severe mucositis, nausea and weight loss.      04/05/2007 - 08/04/2007 Chemotherapy    He received 4 more courses of cisplatin with 5FU and had complete response      07/05/2009 Procedure    Fine-needle aspirate of the right level II lymph nodes come from recurrent metastatic disease. Repeat endoscopy and CT scan show no evidence of disease elsewhere.      07/08/2009 - 12/02/2009 Chemotherapy    He was given 6 cycles of carboplatin, 5-FU and docetaxel      12/03/2009 Surgery  He has surgery to the residual lymph node on the right neck which showed no evidence of disease.      02/22/2012 Imaging    Repeat imaging study showed large recurrent mass. He was referred elsewhere for further treatment.      05/03/2012 Surgery    He underwent left upper lobectomy.      04/29/2013 Imaging    PEt scan showed lesion on right level II B and lower lung was  abnormal      06/03/2013 - 02/02/2014 Chemotherapy    He had 6 cycles of chemotherapy when he was found to have recurrence of cancer and had received oxaliplatin and capecitabine      06/07/2014 Imaging    PET CT scan showed persistent disease in the right neck lymph nodes and left lung      06/29/2014 Procedure    Accession: [id number] repeat LUL biopsy confirmed metastatic cancer      07/18/2014 - 07/31/2014 Radiation Therapy    He received palliative radiation therapy to the lungs      10/10/2014 Imaging    CT scan of the chest, abdomen and pelvis show regression in the size of the lung nodule in the left upper lobe and stable pulmonary nodules      01/24/2015 Imaging    CT scan showed stable disease in neck and lung      06/19/2015 Imaging    CT scan of the neck and the chest show possible mild progression of the nodule in the right side of the neck.      06/25/2015 Imaging    PET scan confirmed disease recurrence in the neck      07/07/2015 Imaging    [facility] had [facility]      09/03/2015 -  Chemotherapy    He received palliative chemo with Nivolumab      10/29/2015 Imaging    PET CT showed positive response to Rx      02/28/2016 Imaging    Ct abdomen showed abnormal thinkening in his stomach      03/03/2016 Imaging    CT: Right sternocleidomastoid muscle metastasis appears less distinct but otherwise not significantly changed in size or configuration since 06/19/2015.2. Left level 3 lymph node which was hypermetabolic by PET-CT in January 2017 appears slightly smaller      04/01/2016 Imaging    CT cervical spine showed no acute fracture or traumatic malalignment in the cervical spine      04/22/2016 Procedure    Port-a-cath placed.      06/16/2016 Imaging    Ct neck showed right sternocleidomastoid muscle metastasis is further decreased in conspicuity since May, and has mildly decreased in size since September 2016. Continued stability of sub-centimeter left  cervical lymph nodes. No new or progressive metastatic disease in the neck.      06/16/2016 Imaging    CT chest showed stable masslike radiation fibrosis in the left upper lobe. Stable subcentimeter pulmonary nodules in the bilateral lower lobes. No new or progressive metastatic disease in the chest. Nonobstructing left renal stone.      10/13/2016 Imaging    Ct neck showed unchanged right sternocleidomastoid muscle metastasis. Unchanged subcentimeter left cervical lymph nodes. No evidence of new or progressive metastatic disease in the neck.      10/13/2016 Imaging    CT chest showed tiny hypervascular foci in the liver, not definitely seen on prior imaging of 06/16/2016 and 02/28/2016. Abdomen MRI without and with contrast recommended  to further evaluate as metastatic disease is a concern. 2. Stable appearance of post treatment changes left upper lung and scattered tiny bilateral pulmonary nodules.      02/11/2017 Imaging    Ct neck: Lymph node mass right posterior neck appears improved from the prior study. Small posterior lymph nodes on the left unchanged. Occluded right jugular vein unchanged.      02/11/2017 Imaging    1. Similar appearance of postsurgical and radiation changes in the left upper lobe. 2. Similar bilateral pulmonary nodules. 3. No thoracic adenopathy. 4. Subtle foci of post-contrast enhancement within the liver are suboptimally characterized on this nondedicated study. Likely similar. These could either be re-evaluated at followup or more entirely characterized with abdominal MRI. 5. Left nephrolithiasis.      05/19/2017 Imaging    Matted lymph node mass right posterior neck appears larger in the recent CT. Accurate measurements difficult due to infiltrating tumor margins and infiltration of the muscle. Right jugular vein again appears occluded or resected. Small left posterior lymph nodes stable. Left upper lobe airspace density stable and similar to the prior CT       06/03/2017 PET scan    1. Hypermetabolic ill-defined right level IIb lymph node, about 1.3 cm in diameter with maximum SUV 9.5 (formerly 8.1). Appearance suspicious for residual/recurrent malignancy. No worrisome left-sided lesion. 2. Left suprahilar indistinct opacity demonstrates no worrisome hypermetabolic activity. The 5 mm left lower lobe pulmonary nodule is stable and not currently hypermetabolic although below sensitive PET-CT size thresholds. 3. Other imaging findings of potential clinical significance: Bilateral nonobstructive nephrolithiasis. Chronic bilateral maxillary sinusitis.       REVIEW OF SYSTEMS:   Constitutional: Denies fevers, chills or abnormal weight loss Eyes: Denies blurriness of vision Ears, nose, mouth, throat, and face: Denies mucositis or sore throat Respiratory: Denies cough, dyspnea or wheezes Cardiovascular: Denies palpitation, chest discomfort or lower extremity swelling Skin: Denies abnormal skin rashes Lymphatics: Denies new lymphadenopathy or easy bruising Neurological:Denies numbness, tingling or new weaknesses Behavioral/Psych: Mood is stable, no new changes  All other systems were reviewed with the patient and are negative.  I have reviewed the past medical history, past surgical history, social history and family history with the patient and they are unchanged from previous note.  ALLERGIES:  is allergic to heparin.  MEDICATIONS:  Current Outpatient Medications  Medication Sig Dispense Refill  . cyclobenzaprine (FLEXERIL) 5 MG tablet Take 1 tablet (5 mg total) by mouth 3 (three) times daily as needed for muscle spasms. 60 tablet 0  . diclofenac (VOLTAREN) 50 MG EC tablet TAKE 1 TABLET BY MOUTH 3 TIMES DAILY AS NEEDED FOR MILD PAIN. 60 tablet 1  . famotidine (PEPCID) 20 MG tablet Take 1 tablet (20 mg total) by mouth 2 (two) times daily. 30 tablet 0  . levothyroxine (SYNTHROID, LEVOTHROID) 125 MCG tablet Take 1 tablet (125 mcg total) daily before  breakfast by mouth. 30 tablet 9  . lidocaine-prilocaine (EMLA) cream Apply 1 application topically as needed. (Patient taking differently: Apply 1 application topically as needed (for port access). ) 30 g 3  . meclizine (ANTIVERT) 25 MG tablet Take 1 tablet (25 mg total) by mouth 3 (three) times daily as needed for dizziness. 30 tablet 0  . naproxen (NAPROSYN) 500 MG tablet Take 1 tablet (500 mg total) by mouth 2 (two) times daily as needed. 30 tablet 0  . omeprazole (PRILOSEC) 40 MG capsule Take 1 capsule (40 mg total) by mouth daily. 30 capsule 5  .  oxyCODONE (ROXICODONE) 15 MG immediate release tablet Take 1 tablet (15 mg total) by mouth every 4 (four) hours as needed for pain. 60 tablet 0   No current facility-administered medications for this visit.    Facility-Administered Medications Ordered in Other Visits  Medication Dose Route Frequency Provider Last Rate Last Dose  . sodium chloride 0.9 % injection 10 mL  10 mL Intracatheter PRN Alvy Bimler, [facility], MD        PHYSICAL EXAMINATION: ECOG PERFORMANCE STATUS: 1 - Symptomatic but completely ambulatory  Vitals:   02/17/18 0857  BP: 109/64  Pulse: 68  Resp: 18  Temp: 98.2 F (36.8 C)  SpO2: 100%   Filed Weights   02/17/18 0857  Weight: 180 lb 6.4 oz (81.8 kg)    GENERAL:alert, no distress and comfortable SKIN: skin color, texture, turgor are normal, no rashes or significant lesions EYES: normal, Conjunctiva are pink and non-injected, sclera clear OROPHARYNX:no exudate, no erythema and lips, buccal mucosa, and tongue normal  NECK: Neck is fibrosis from prior surgery.  No other abnormalities LYMPH:  no palpable lymphadenopathy in the cervical, axillary or inguinal LUNGS: clear to auscultation and percussion with normal breathing effort HEART: regular rate & rhythm and no murmurs and no lower extremity edema ABDOMEN:abdomen soft, non-tender and normal bowel sounds Musculoskeletal:no cyanosis of digits and no clubbing  NEURO: alert &  oriented x 3 with fluent speech, no focal motor/sensory deficits  LABORATORY DATA:  I have reviewed the data as listed    Component Value Date/Time   NA 140 01/20/2018 1214   NA 139 09/22/2017 0829   K 4.3 01/20/2018 1214   K 3.5 09/22/2017 0829   CL 104 01/20/2018 1214   CO2 29 01/20/2018 1214   CO2 26 09/22/2017 0829   GLUCOSE 95 01/20/2018 1214   GLUCOSE 133 09/22/2017 0829   BUN 18 01/20/2018 1214   BUN 14.1 09/22/2017 0829   CREATININE 0.93 01/20/2018 1214   CREATININE 0.9 09/22/2017 0829   CALCIUM 10.0 01/20/2018 1214   CALCIUM 9.1 09/22/2017 0829   PROT 7.3 01/20/2018 1214   PROT 6.8 09/22/2017 0829   ALBUMIN 4.3 01/20/2018 1214   ALBUMIN 4.1 09/22/2017 0829   AST 34 01/20/2018 1214   AST 22 09/22/2017 0829   ALT 46 01/20/2018 1214   ALT 30 09/22/2017 0829   ALKPHOS 62 01/20/2018 1214   ALKPHOS 55 09/22/2017 0829   BILITOT 0.3 01/20/2018 1214   BILITOT 0.35 09/22/2017 0829   GFRNONAA >60 01/20/2018 1214   GFRAA >60 01/20/2018 1214    No results found for: SPEP, UPEP  Lab Results  Component Value Date   WBC 4.0 02/17/2018   NEUTROABS 1.9 02/17/2018   HGB 13.8 02/17/2018   HCT 40.1 02/17/2018   MCV 87.8 02/17/2018   PLT 194 02/17/2018      Chemistry      Component Value Date/Time   NA 140 01/20/2018 1214   NA 139 09/22/2017 0829   K 4.3 01/20/2018 1214   K 3.5 09/22/2017 0829   CL 104 01/20/2018 1214   CO2 29 01/20/2018 1214   CO2 26 09/22/2017 0829   BUN 18 01/20/2018 1214   BUN 14.1 09/22/2017 0829   CREATININE 0.93 01/20/2018 1214   CREATININE 0.9 09/22/2017 0829      Component Value Date/Time   CALCIUM 10.0 01/20/2018 1214   CALCIUM 9.1 09/22/2017 0829   ALKPHOS 62 01/20/2018 1214   ALKPHOS 55 09/22/2017 0829   AST 34 01/20/2018 1214  AST 22 09/22/2017 0829   ALT 46 01/20/2018 1214   ALT 30 09/22/2017 0829   BILITOT 0.3 01/20/2018 1214   BILITOT 0.35 09/22/2017 0829      All questions were answered. The patient knows to call the  clinic with any problems, questions or concerns. No barriers to learning was detected.  I spent 15 minutes counseling the patient face to face. The total time spent in the appointment was 20 minutes and more than 50% was on counseling and review of test results  [name], MD 02/17/2018 9:18 AM

## 2018-02-17 NOTE — Assessment & Plan Note (Signed)
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 I recommend continue conservative management with physical therapy, topical lidocaine, muscle relaxant and pain medicine as needed I have refilled his prescription of pain medicine The patient is taking diclofenac and I recommend not more than twice a day and to be taken with food if possible

## 2018-02-17 NOTE — Patient Instructions (Signed)
[facility] Discharge Instructions for Patients Receiving Chemotherapy  Today you received the following chemotherapy agents nivolumab   To help prevent nausea and vomiting after your treatment, we encourage you to take your nausea medication as directed   If you develop nausea and vomiting that is not controlled by your nausea medication, call the clinic.   BELOW ARE SYMPTOMS THAT SHOULD BE REPORTED IMMEDIATELY:  *FEVER GREATER THAN 100.5 F  *CHILLS WITH OR WITHOUT FEVER  NAUSEA AND VOMITING THAT IS NOT CONTROLLED WITH YOUR NAUSEA MEDICATION  *UNUSUAL SHORTNESS OF BREATH  *UNUSUAL BRUISING OR BLEEDING  TENDERNESS IN MOUTH AND THROAT WITH OR WITHOUT PRESENCE OF ULCERS  *URINARY PROBLEMS  *BOWEL PROBLEMS  UNUSUAL RASH Items with * indicate a potential emergency and should be followed up as soon as possible.  Feel free to call the clinic you have any questions or concerns. The clinic phone number is [phone].  

## 2018-02-17 NOTE — Telephone Encounter (Signed)
Gave patient avs report and appointments for June. Next date in care plan for tx 6/13 - also per 5/16 los tx 6/13.

## 2018-02-17 NOTE — Assessment & Plan Note (Signed)
The patient has been compliant taking medications as directed I will continue to monitor TSH and adjust his thyroid medicine as needed

## 2018-02-17 NOTE — Assessment & Plan Note (Signed)
He has intermittent right neck pain that comes and goes Clinically, his neck is thickened & woody from prior surgery and fibrosis from radiation Last PET CT scan from 06/03/17 showed no evidence of disease progression Ultimately, we agreed to continue treatment as scheduled every month I will repeat imaging study again once a year, due in August 2019

## 2018-02-17 NOTE — Assessment & Plan Note (Signed)
His symptoms of intermittent chest wall pain is likely related to prior surgery.  The patient is reassured His last imaging showed no evidence of disease progression I plan to repeat CT chest again next year

## 2018-02-17 NOTE — Assessment & Plan Note (Signed)
He has chronic constipation likely due to his fasting and pain medicine We discussed laxative management

## 2018-03-17 ENCOUNTER — Telehealth: Payer: Self-pay | Admitting: Hematology and Oncology

## 2018-03-17 ENCOUNTER — Encounter: Payer: Self-pay | Admitting: Hematology and Oncology

## 2018-03-17 ENCOUNTER — Inpatient Hospital Stay: Payer: Medicaid Other

## 2018-03-17 ENCOUNTER — Inpatient Hospital Stay: Payer: Medicaid Other | Attending: Hematology and Oncology | Admitting: Hematology and Oncology

## 2018-03-17 VITALS — BP 131/87 | HR 77 | Temp 98.3°F | Resp 18 | Ht 68.75 in | Wt 178.7 lb

## 2018-03-17 DIAGNOSIS — C760 Malignant neoplasm of head, face and neck: Secondary | ICD-10-CM | POA: Diagnosis present

## 2018-03-17 DIAGNOSIS — E039 Hypothyroidism, unspecified: Secondary | ICD-10-CM

## 2018-03-17 DIAGNOSIS — M542 Cervicalgia: Secondary | ICD-10-CM | POA: Diagnosis not present

## 2018-03-17 DIAGNOSIS — Z5112 Encounter for antineoplastic immunotherapy: Secondary | ICD-10-CM | POA: Insufficient documentation

## 2018-03-17 DIAGNOSIS — C7800 Secondary malignant neoplasm of unspecified lung: Secondary | ICD-10-CM | POA: Diagnosis not present

## 2018-03-17 DIAGNOSIS — K5909 Other constipation: Secondary | ICD-10-CM

## 2018-03-17 DIAGNOSIS — Z95828 Presence of other vascular implants and grafts: Secondary | ICD-10-CM

## 2018-03-17 LAB — COMPREHENSIVE METABOLIC PANEL
ALBUMIN: 4.4 g/dL (ref 3.5–5.0)
ALK PHOS: 63 U/L (ref 40–150)
ALT: 27 U/L (ref 0–55)
AST: 25 U/L (ref 5–34)
Anion gap: 10 (ref 3–11)
BILIRUBIN TOTAL: 0.2 mg/dL (ref 0.2–1.2)
BUN: 14 mg/dL (ref 7–26)
CALCIUM: 9.5 mg/dL (ref 8.4–10.4)
CO2: 26 mmol/L (ref 22–29)
CREATININE: 0.92 mg/dL (ref 0.70–1.30)
Chloride: 102 mmol/L (ref 98–109)
GFR calc Af Amer: >60 mL/min (ref >60)
GFR calc non Af Amer: >60 mL/min (ref >60)
Glucose, Bld: 118 mg/dL (ref 70–140)
Potassium: 3.7 mmol/L (ref 3.5–5.1)
Sodium: 138 mmol/L (ref 136–145)
TOTAL PROTEIN: 7.1 g/dL (ref 6.4–8.3)

## 2018-03-17 LAB — CBC WITH DIFFERENTIAL/PLATELET
Basophils Absolute: 0 10*3/uL (ref 0.0–0.1)
Basophils Relative: 0 %
EOS ABS: 0.2 10*3/uL (ref 0.0–0.5)
EOS PCT: 3 %
HCT: 41.3 % (ref 38.4–49.9)
Hemoglobin: 13.9 g/dL (ref 13.0–17.1)
Lymphocytes Relative: 25 %
Lymphs Abs: 1.3 10*3/uL (ref 0.9–3.3)
MCH: 30.3 pg (ref 27.2–33.4)
MCHC: 33.7 g/dL (ref 32.0–36.0)
MCV: 90 fL (ref 79.3–98.0)
MONO ABS: 0.4 10*3/uL (ref 0.1–0.9)
MONOS PCT: 8 %
NEUTROS ABS: 3.4 10*3/uL (ref 1.5–6.5)
NEUTROS PCT: 64 %
PLATELETS: 205 10*3/uL (ref 140–400)
RBC: 4.59 MIL/uL (ref 4.20–5.82)
RDW: 13.8 % (ref 11.0–14.6)
WBC: 5.3 10*3/uL (ref 4.0–10.3)

## 2018-03-17 LAB — TSH: TSH: 1.166 u[IU]/mL (ref 0.320–4.118)

## 2018-03-17 MED ORDER — POLYETHYLENE GLYCOL 3350 17 G PO PACK: 17.0000 g | PACK | Freq: Every day | ORAL | 3 refills | Status: DC

## 2018-03-17 MED ORDER — SODIUM CHLORIDE 0.9 % IV SOLN
240.0000 mg | Freq: Once | INTRAVENOUS | Status: AC
  Administered 2018-03-17: 240 mg via INTRAVENOUS
  Filled 2018-03-17: qty 24

## 2018-03-17 MED ORDER — ANTICOAGULANT SODIUM CITRATE 4% (200MG/5ML) IV SOLN
5.0000 mL | Freq: Once | Status: AC
  Administered 2018-03-17: 5 mL via INTRAVENOUS
  Filled 2018-03-17: qty 5

## 2018-03-17 MED ORDER — SODIUM CHLORIDE 0.9 % IJ SOLN
10.0000 mL | INTRAMUSCULAR | Status: DC | PRN
  Administered 2018-03-17: 10 mL
  Filled 2018-03-17: qty 10

## 2018-03-17 MED ORDER — SODIUM CHLORIDE 0.9% FLUSH
10.0000 mL | Freq: Once | INTRAVENOUS | Status: AC
  Administered 2018-03-17: 10 mL
  Filled 2018-03-17: qty 10

## 2018-03-17 MED ORDER — SODIUM CHLORIDE 0.9 % IV SOLN
Freq: Once | INTRAVENOUS | Status: AC
  Administered 2018-03-17: 13:00:00 via INTRAVENOUS

## 2018-03-17 MED FILL — SM CLEARLAX POWDER: 14 days supply | Qty: 238 | Fill #0

## 2018-03-17 NOTE — Assessment & Plan Note (Signed)
His symptoms of intermittent chest wall pain is likely related to prior surgery.  The patient is reassured His last imaging showed no evidence of disease progression I plan to repeat CT chest again in AU[place]ust

## 2018-03-17 NOTE — Assessment & Plan Note (Signed)
The patient has been compliant taking medications as directed I will continue to monitor TSH and adjust his thyroid medicine as needed

## 2018-03-17 NOTE — Telephone Encounter (Signed)
Patient declined avs and calendar of upcoming appointments.

## 2018-03-17 NOTE — Assessment & Plan Note (Signed)
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 I recommend continue conservative management with physical therapy, topical lidocaine, muscle relaxant and pain medicine as needed The patient is taking diclofenac and I recommend not more than twice a day and to be taken with food if possible

## 2018-03-17 NOTE — Assessment & Plan Note (Signed)
He has chronic constipation likely due to pain medicine We discussed laxative management

## 2018-03-17 NOTE — Progress Notes (Signed)
[place] OFFICE PROGRESS NOTE  Patient Care Team: Patient, No Pcp Per as PCP - General (General Practice) [name], MD as Consulting Physician (Hematology and Oncology) Eppie Gibson, MD as Attending Physician (Radiation Oncology) Jodi Marble, MD as Consulting Physician (Otolaryngology) Philomena Doheny, MD as Referring Physician (Plastic Surgery) Irene Shipper, MD as Consulting Physician (Gastroenterology) System, Provider Not In  ASSESSMENT & PLAN:  Primary cancer of head and neck [facility]) He has intermittent right neck pain that comes and goes Clinically, his neck is thickened & woody from prior surgery and fibrosis from radiation Last PET CT scan from 06/03/17 showed no evidence of disease progression Ultimately, we agreed to continue treatment as scheduled every month I will repeat imaging study again once a year, due in August 2019   Metastasis to lung His symptoms of intermittent chest wall pain is likely related to prior surgery.  The patient is reassured His last imaging showed no evidence of disease progression I plan to repeat CT chest again in [place]  Acquired hypothyroidism The patient has been compliant taking medications as directed I will continue to monitor TSH and adjust his thyroid medicine as needed  Neck pain on right side The patient has significant fibrosis and scar tissue from prior surgery and radiation. I recommend continue conservative management with physical therapy, topical lidocaine, muscle relaxant and pain medicine as needed The patient is taking diclofenac and I recommend not more than twice a day and to be taken with food if possible  Other constipation He has chronic constipation likely due to pain medicine We discussed laxative management   Orders Placed This Encounter  Procedures  . NM PET Image Restag (PS) Skull Base To Thigh    Standing Status:   Future    Standing Expiration Date:   03/18/2019    Order Specific  Question:   If indicated for the ordered procedure, I authorize the administration of a radiopharmaceutical per Radiology protocol    Answer:   Yes    Order Specific Question:   Preferred imaging location?    Answer:   [facility] [place]    Order Specific Question:   Radiology Contrast Protocol - do NOT remove file path    Answer:   [email or web address]    INTERVAL HISTORY: Please see below for problem oriented charting. 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 Pan-endoscopy elsewhere showed cancer from nasopharyngeal space.      01/04/2007 - 02/20/2007 Chemotherapy    He received 2 cycles of cisplatin and 5FU followed by concurrent chemo with weekly cisplatin and radiation. He only received 2 doses of chemo due to severe mucositis, nausea and weight loss.  04/05/2007 - 08/04/2007 Chemotherapy    He received 4 more courses of cisplatin with 5FU and had complete response      07/05/2009 Procedure    Fine-needle aspirate of the right level II lymph nodes come from recurrent metastatic disease. Repeat endoscopy and CT scan show no evidence of disease elsewhere.      07/08/2009 - 12/02/2009 Chemotherapy    He was given 6  cycles of carboplatin, 5-FU and docetaxel      12/03/2009 Surgery    He has surgery to the residual lymph node on the right neck which showed no evidence of disease.      02/22/2012 Imaging    Repeat imaging study showed large recurrent mass. He was referred elsewhere for further treatment.      05/03/2012 Surgery    He underwent left upper lobectomy.      04/29/2013 Imaging    PEt scan showed lesion on right level II B and lower lung was abnormal      06/03/2013 - 02/02/2014 Chemotherapy    He had 6 cycles of chemotherapy when he was found to have recurrence of cancer and had received oxaliplatin and capecitabine      06/07/2014 Imaging    PET CT scan showed persistent disease in the right neck lymph nodes and left lung      06/29/2014 Procedure    Accession: [id number] repeat LUL biopsy confirmed metastatic cancer      07/18/2014 - 07/31/2014 Radiation Therapy    He received palliative radiation therapy to the lungs      10/10/2014 Imaging    CT scan of the chest, abdomen and pelvis show regression in the size of the lung nodule in the left upper lobe and stable pulmonary nodules      01/24/2015 Imaging    CT scan showed stable disease in neck and lung      06/19/2015 Imaging    CT scan of the neck and the chest show possible mild progression of the nodule in the right side of the neck.      06/25/2015 Imaging    PET scan confirmed disease recurrence in the neck      07/07/2015 Imaging    [facility]      09/03/2015 -  Chemotherapy    He received palliative chemo with Nivolumab      10/29/2015 Imaging    PET CT showed positive response to Rx      02/28/2016 Imaging    Ct abdomen showed abnormal thinkening in his stomach      03/03/2016 Imaging    CT: Right sternocleidomastoid muscle metastasis appears less distinct but otherwise not significantly changed in size or configuration since 06/19/2015.2. Left level 3 lymph node which was hypermetabolic by  PET-CT in January 2017 appears slightly smaller      04/01/2016 Imaging    CT cervical spine showed no acute fracture or traumatic malalignment in the cervical spine      04/22/2016 Procedure    Port-a-cath placed.      06/16/2016 Imaging    Ct neck showed right sternocleidomastoid muscle metastasis is further decreased in conspicuity since May, and has mildly decreased in size since September 2016. Continued stability of sub-centimeter left cervical lymph nodes. No new or progressive metastatic disease in the neck.      06/16/2016 Imaging    CT chest showed stable masslike radiation fibrosis in the left upper lobe. Stable subcentimeter pulmonary nodules in the  bilateral lower lobes. No new or progressive metastatic disease in the chest. Nonobstructing left renal stone.      10/13/2016 Imaging    Ct neck showed unchanged right sternocleidomastoid muscle metastasis. Unchanged subcentimeter left cervical lymph nodes. No evidence of new or progressive metastatic disease in the neck.      10/13/2016 Imaging    CT chest showed tiny hypervascular foci in the liver, not definitely seen on prior imaging of 06/16/2016 and 02/28/2016. Abdomen MRI without and with contrast recommended to further evaluate as metastatic disease is a concern. 2. Stable appearance of post treatment changes left upper lung and scattered tiny bilateral pulmonary nodules.      02/11/2017 Imaging    Ct neck: Lymph node mass right posterior neck appears improved from the prior study. Small posterior lymph nodes on the left unchanged. Occluded right jugular vein unchanged.      02/11/2017 Imaging    1. Similar appearance of postsurgical and radiation changes in the left upper lobe. 2. Similar bilateral pulmonary nodules. 3. No thoracic adenopathy. 4. Subtle foci of post-contrast enhancement within the liver are suboptimally characterized on this nondedicated study. Likely similar. These could either be re-evaluated at followup  or more entirely characterized with abdominal MRI. 5. Left nephrolithiasis.      05/19/2017 Imaging    Matted lymph node mass right posterior neck appears larger in the recent CT. Accurate measurements difficult due to infiltrating tumor margins and infiltration of the muscle. Right jugular vein again appears occluded or resected. Small left posterior lymph nodes stable. Left upper lobe airspace density stable and similar to the prior CT      06/03/2017 PET scan    1. Hypermetabolic ill-defined right level IIb lymph node, about 1.3 cm in diameter with maximum SUV 9.5 (formerly 8.1). Appearance suspicious for residual/recurrent malignancy. No worrisome left-sided lesion. 2. Left suprahilar indistinct opacity demonstrates no worrisome hypermetabolic activity. The 5 mm left lower lobe pulmonary nodule is stable and not currently hypermetabolic although below sensitive PET-CT size thresholds. 3. Other imaging findings of potential clinical significance: Bilateral nonobstructive nephrolithiasis. Chronic bilateral maxillary sinusitis.       REVIEW OF SYSTEMS:   Constitutional: Denies fevers, chills or abnormal weight loss Eyes: Denies blurriness of vision Ears, nose, mouth, throat, and face: Denies mucositis or sore throat Respiratory: Denies cough, dyspnea or wheezes Cardiovascular: Denies palpitation, chest discomfort or lower extremity swelling Skin: Denies abnormal skin rashes Lymphatics: Denies new lymphadenopathy or easy bruising Neurological:Denies numbness, tingling or new weaknesses Behavioral/Psych: Mood is stable, no new changes  All other systems were reviewed with the patient and are negative.  I have reviewed the past medical history, past surgical history, social history and family history with the patient and they are unchanged from previous note.  ALLERGIES:  is allergic to heparin.  MEDICATIONS:  Current Outpatient Medications  Medication Sig Dispense Refill  .  cyclobenzaprine (FLEXERIL) 5 MG tablet Take 1 tablet (5 mg total) by mouth 3 (three) times daily as needed for muscle spasms. 60 tablet 0  . diclofenac (VOLTAREN) 50 MG EC tablet TAKE 1 TABLET BY MOUTH 3 TIMES DAILY AS NEEDED FOR MILD PAIN. 60 tablet 1  . famotidine (PEPCID) 20 MG tablet Take 1 tablet (20 mg total) by mouth 2 (two) times daily. 30 tablet 0  . levothyroxine (SYNTHROID, LEVOTHROID) 125 MCG tablet Take 1 tablet (125 mcg total) daily before breakfast by mouth. 30 tablet 9  . lidocaine-prilocaine (EMLA) cream Apply 1 application topically as  needed. (Patient taking differently: Apply 1 application topically as needed (for port access). ) 30 g 3  . meclizine (ANTIVERT) 25 MG tablet Take 1 tablet (25 mg total) by mouth 3 (three) times daily as needed for dizziness. 30 tablet 0  . naproxen (NAPROSYN) 500 MG tablet Take 1 tablet (500 mg total) by mouth 2 (two) times daily as needed. 30 tablet 0  . omeprazole (PRILOSEC) 40 MG capsule Take 1 capsule (40 mg total) by mouth daily. 30 capsule 5  . oxyCODONE (ROXICODONE) 15 MG immediate release tablet Take 1 tablet (15 mg total) by mouth every 4 (four) hours as needed for pain. 60 tablet 0  . polyethylene glycol (MIRALAX) packet Take 17 g by mouth daily. 14 each 3   No current facility-administered medications for this visit.    Facility-Administered Medications Ordered in Other Visits  Medication Dose Route Frequency Provider Last Rate Last Dose  . sodium chloride 0.9 % injection 10 mL  10 mL Intracatheter PRN Alvy Bimler, Rosemary Mossbarger, MD        PHYSICAL EXAMINATION: ECOG PERFORMANCE STATUS: 1 - Symptomatic but completely ambulatory  Vitals:   03/17/18 1155  BP: 131/87  Pulse: 77  Resp: 18  Temp: 98.3 F (36.8 C)  SpO2: 100%   Filed Weights   03/17/18 1155  Weight: 178 lb 11.2 oz (81.1 kg)    GENERAL:alert, no distress and comfortable SKIN: skin color, texture, turgor are normal, no rashes or significant lesions EYES: normal, Conjunctiva  are pink and non-injected, sclera clear OROPHARYNX:no exudate, no erythema and lips, buccal mucosa, and tongue normal  NECK: Noted neck fibrosis from prior treatment LYMPH:  no palpable lymphadenopathy in the cervical, axillary or inguinal LUNGS: clear to auscultation and percussion with normal breathing effort HEART: regular rate & rhythm and no murmurs and no lower extremity edema ABDOMEN:abdomen soft, non-tender and normal bowel sounds Musculoskeletal:no cyanosis of digits and no clubbing  NEURO: alert & oriented x 3 with fluent speech, no focal motor/sensory deficits  LABORATORY DATA:  I have reviewed the data as listed    Component Value Date/Time   NA 138 03/17/2018 1127   NA 139 09/22/2017 0829   K 3.7 03/17/2018 1127   K 3.5 09/22/2017 0829   CL 102 03/17/2018 1127   CO2 26 03/17/2018 1127   CO2 26 09/22/2017 0829   GLUCOSE 118 03/17/2018 1127   GLUCOSE 133 09/22/2017 0829   BUN 14 03/17/2018 1127   BUN 14.1 09/22/2017 0829   CREATININE 0.92 03/17/2018 1127   CREATININE 0.9 09/22/2017 0829   CALCIUM 9.5 03/17/2018 1127   CALCIUM 9.1 09/22/2017 0829   PROT 7.1 03/17/2018 1127   PROT 6.8 09/22/2017 0829   ALBUMIN 4.4 03/17/2018 1127   ALBUMIN 4.1 09/22/2017 0829   AST 25 03/17/2018 1127   AST 22 09/22/2017 0829   ALT 27 03/17/2018 1127   ALT 30 09/22/2017 0829   ALKPHOS 63 03/17/2018 1127   ALKPHOS 55 09/22/2017 0829   BILITOT 0.2 03/17/2018 1127   BILITOT 0.35 09/22/2017 0829   GFRNONAA >60 03/17/2018 1127   GFRAA >60 03/17/2018 1127    No results found for: SPEP, UPEP  Lab Results  Component Value Date   WBC 5.3 03/17/2018   NEUTROABS 3.4 03/17/2018   HGB 13.9 03/17/2018   HCT 41.3 03/17/2018   MCV 90.0 03/17/2018   PLT 205 03/17/2018      Chemistry      Component Value Date/Time   NA 138 03/17/2018 1127  NA 139 09/22/2017 0829   K 3.7 03/17/2018 1127   K 3.5 09/22/2017 0829   CL 102 03/17/2018 1127   CO2 26 03/17/2018 1127   CO2 26  09/22/2017 0829   BUN 14 03/17/2018 1127   BUN 14.1 09/22/2017 0829   CREATININE 0.92 03/17/2018 1127   CREATININE 0.9 09/22/2017 0829      Component Value Date/Time   CALCIUM 9.5 03/17/2018 1127   CALCIUM 9.1 09/22/2017 0829   ALKPHOS 63 03/17/2018 1127   ALKPHOS 55 09/22/2017 0829   AST 25 03/17/2018 1127   AST 22 09/22/2017 0829   ALT 27 03/17/2018 1127   ALT 30 09/22/2017 0829   BILITOT 0.2 03/17/2018 1127   BILITOT 0.35 09/22/2017 0829       All questions were answered. The patient knows to call the clinic with any problems, questions or concerns. No barriers to learning was detected.  I spent 15 minutes counseling the patient face to face. The total time spent in the appointment was 20 minutes and more than 50% was on counseling and review of test results  [name], MD 03/17/2018 1:09 PM

## 2018-03-17 NOTE — Assessment & Plan Note (Signed)
He has intermittent right neck pain that comes and goes Clinically, his neck is thickened & woody from prior surgery and fibrosis from radiation Last PET CT scan from 06/03/17 showed no evidence of disease progression Ultimately, we agreed to continue treatment as scheduled every month I will repeat imaging study again once a year, due in August 2019

## 2018-03-17 NOTE — Patient Instructions (Signed)
[facility] Discharge Instructions for Patients Receiving Chemotherapy  Today you received the following chemotherapy agents nivolumab   To help prevent nausea and vomiting after your treatment, we encourage you to take your nausea medication as directed   If you develop nausea and vomiting that is not controlled by your nausea medication, call the clinic.   BELOW ARE SYMPTOMS THAT SHOULD BE REPORTED IMMEDIATELY:  *FEVER GREATER THAN 100.5 F  *CHILLS WITH OR WITHOUT FEVER  NAUSEA AND VOMITING THAT IS NOT CONTROLLED WITH YOUR NAUSEA MEDICATION  *UNUSUAL SHORTNESS OF BREATH  *UNUSUAL BRUISING OR BLEEDING  TENDERNESS IN MOUTH AND THROAT WITH OR WITHOUT PRESENCE OF ULCERS  *URINARY PROBLEMS  *BOWEL PROBLEMS  UNUSUAL RASH Items with * indicate a potential emergency and should be followed up as soon as possible.  Feel free to call the clinic you have any questions or concerns. The clinic phone number is [phone].  

## 2018-04-06 ENCOUNTER — Telehealth: Payer: Self-pay

## 2018-04-06 ENCOUNTER — Other Ambulatory Visit: Payer: Self-pay

## 2018-04-06 MED ORDER — OXYCODONE HCL 15 MG PO TABS: 15.0000 mg | ORAL_TABLET | ORAL | 0 refills | Status: DC | PRN

## 2018-04-06 NOTE — Telephone Encounter (Signed)
He called and left a message requesting refill of Oxycodone Rx.  Called back and told him Rx ready for pick up. He verbalized understanding.

## 2018-04-13 ENCOUNTER — Telehealth: Payer: Self-pay | Admitting: *Deleted

## 2018-04-13 NOTE — Telephone Encounter (Signed)
I cannot control scheduling Please send scheduling msg to rescehdule

## 2018-04-13 NOTE — Telephone Encounter (Signed)
Pt left a voice mail stating he is in [place] for a funeral and needs to reschedule his appts from 7/11 to next week.

## 2018-04-14 ENCOUNTER — Inpatient Hospital Stay: Payer: Medicaid Other

## 2018-04-14 ENCOUNTER — Telehealth: Payer: Self-pay

## 2018-04-14 ENCOUNTER — Inpatient Hospital Stay: Payer: Medicaid Other | Admitting: Hematology and Oncology

## 2018-04-14 ENCOUNTER — Telehealth: Payer: Self-pay | Admitting: Hematology and Oncology

## 2018-04-14 NOTE — Telephone Encounter (Signed)
msg sent to schedulers to reschedule all appts from today d/t pt had to cancel.

## 2018-04-14 NOTE — Telephone Encounter (Signed)
Called pt re appts that were added per 7/10 - spoke w/ pt re appts. Keeping them - pt said he may not be back in town but will call if he cannot make that appt.

## 2018-04-20 ENCOUNTER — Telehealth: Payer: Self-pay

## 2018-04-20 NOTE — Telephone Encounter (Signed)
He called and left a message to call him.  Called back. He needs to cancel tomorrows appointments. He is in [place] due to having a death in the family. He is unable to leave today to make the appt. He wants Dr. Alvy Bimler to know that he is sorry for the last minute cancellation.  Scheduling message sent to reschedule appts to next week. Instructed to call for any questions.

## 2018-04-21 ENCOUNTER — Inpatient Hospital Stay: Payer: Medicaid Other

## 2018-04-21 ENCOUNTER — Telehealth: Payer: Self-pay | Admitting: Hematology and Oncology

## 2018-04-21 ENCOUNTER — Inpatient Hospital Stay: Payer: Medicaid Other | Admitting: Hematology and Oncology

## 2018-04-21 NOTE — Telephone Encounter (Signed)
Spoke to patient regarding upcoming July appts per 7/17 sch message

## 2018-04-28 ENCOUNTER — Inpatient Hospital Stay: Payer: Medicaid Other | Attending: Hematology and Oncology

## 2018-04-28 ENCOUNTER — Inpatient Hospital Stay: Payer: Medicaid Other

## 2018-04-28 ENCOUNTER — Telehealth: Payer: Self-pay | Admitting: Hematology and Oncology

## 2018-04-28 ENCOUNTER — Inpatient Hospital Stay (HOSPITAL_BASED_OUTPATIENT_CLINIC_OR_DEPARTMENT_OTHER): Payer: Medicaid Other | Admitting: Hematology and Oncology

## 2018-04-28 DIAGNOSIS — C7800 Secondary malignant neoplasm of unspecified lung: Secondary | ICD-10-CM | POA: Insufficient documentation

## 2018-04-28 DIAGNOSIS — C760 Malignant neoplasm of head, face and neck: Secondary | ICD-10-CM

## 2018-04-28 DIAGNOSIS — Z95828 Presence of other vascular implants and grafts: Secondary | ICD-10-CM

## 2018-04-28 DIAGNOSIS — M542 Cervicalgia: Secondary | ICD-10-CM | POA: Diagnosis not present

## 2018-04-28 DIAGNOSIS — E039 Hypothyroidism, unspecified: Secondary | ICD-10-CM

## 2018-04-28 DIAGNOSIS — Z5112 Encounter for antineoplastic immunotherapy: Secondary | ICD-10-CM | POA: Insufficient documentation

## 2018-04-28 LAB — CBC WITH DIFFERENTIAL/PLATELET
BASOS ABS: 0 10*3/uL (ref 0.0–0.1)
BASOS PCT: 1 %
Eosinophils Absolute: 0.1 10*3/uL (ref 0.0–0.5)
Eosinophils Relative: 3 %
HCT: 45.2 % (ref 38.4–49.9)
HEMOGLOBIN: 15.3 g/dL (ref 13.0–17.1)
LYMPHS PCT: 27 %
Lymphs Abs: 1.4 10*3/uL (ref 0.9–3.3)
MCH: 29.8 pg (ref 27.2–33.4)
MCHC: 33.8 g/dL (ref 32.0–36.0)
MCV: 88.3 fL (ref 79.3–98.0)
Monocytes Absolute: 0.5 10*3/uL (ref 0.1–0.9)
Monocytes Relative: 9 %
NEUTROS ABS: 3.1 10*3/uL (ref 1.5–6.5)
Neutrophils Relative %: 60 %
Platelets: 229 10*3/uL (ref 140–400)
RBC: 5.12 MIL/uL (ref 4.20–5.82)
RDW: 14.4 % (ref 11.0–14.6)
WBC: 5.1 10*3/uL (ref 4.0–10.3)

## 2018-04-28 LAB — COMPREHENSIVE METABOLIC PANEL
ALBUMIN: 4.5 g/dL (ref 3.5–5.0)
ALK PHOS: 66 U/L (ref 38–126)
ALT: 30 U/L (ref 0–44)
AST: 26 U/L (ref 15–41)
Anion gap: 8 (ref 5–15)
BUN: 14 mg/dL (ref 6–20)
CO2: 28 mmol/L (ref 22–32)
Calcium: 10 mg/dL (ref 8.9–10.3)
Chloride: 102 mmol/L (ref 98–111)
Creatinine, Ser: 0.99 mg/dL (ref 0.61–1.24)
GFR calc Af Amer: >60 mL/min (ref >60)
GFR calc non Af Amer: >60 mL/min (ref >60)
GLUCOSE: 112 mg/dL — AB (ref 70–99)
POTASSIUM: 4 mmol/L (ref 3.5–5.1)
SODIUM: 138 mmol/L (ref 135–145)
TOTAL PROTEIN: 7.5 g/dL (ref 6.5–8.1)
Total Bilirubin: 0.4 mg/dL (ref 0.3–1.2)

## 2018-04-28 LAB — TSH: TSH: 1.915 u[IU]/mL (ref 0.320–4.118)

## 2018-04-28 MED ORDER — ANTICOAGULANT SODIUM CITRATE 4% (200MG/5ML) IV SOLN
5.0000 mL | Freq: Once | Status: AC
  Administered 2018-04-28: 5 mL via INTRAVENOUS
  Filled 2018-04-28: qty 5

## 2018-04-28 MED ORDER — SODIUM CHLORIDE 0.9 % IV SOLN
Freq: Once | INTRAVENOUS | Status: AC
  Administered 2018-04-28: 13:00:00 via INTRAVENOUS
  Filled 2018-04-28: qty 250

## 2018-04-28 MED ORDER — SODIUM CHLORIDE 0.9 % IV SOLN
240.0000 mg | Freq: Once | INTRAVENOUS | Status: AC
  Administered 2018-04-28: 240 mg via INTRAVENOUS
  Filled 2018-04-28: qty 24

## 2018-04-28 MED ORDER — SODIUM CHLORIDE 0.9 % IJ SOLN
10.0000 mL | INTRAMUSCULAR | Status: DC | PRN
  Filled 2018-04-28: qty 10

## 2018-04-28 MED ORDER — SODIUM CHLORIDE 0.9% FLUSH
10.0000 mL | Freq: Once | INTRAVENOUS | Status: AC
  Administered 2018-04-28: 10 mL
  Filled 2018-04-28: qty 10

## 2018-04-28 NOTE — Telephone Encounter (Signed)
Scheduled apt per 7/25 los - gave patient AVS and calender per los.

## 2018-04-29 ENCOUNTER — Encounter: Payer: Self-pay | Admitting: Hematology and Oncology

## 2018-04-29 NOTE — Assessment & Plan Note (Signed)
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 I recommend continue conservative management with physical therapy, topical lidocaine, muscle relaxant and pain medicine as needed The patient is taking diclofenac and I recommend not more than twice a day and to be taken with food if possible

## 2018-04-29 NOTE — Assessment & Plan Note (Signed)
His symptoms of intermittent chest wall pain is likely related to prior surgery.  The patient is reassured His last imaging showed no evidence of disease progression He is not symptomatic. I do not plan to repeat imaging study for a while.

## 2018-04-29 NOTE — Assessment & Plan Note (Signed)
He has intermittent right neck pain that comes and goes Clinically, his neck is thickened & woody from prior surgery and fibrosis from radiation Last PET CT scan from 06/03/17 showed no evidence of disease progression Ultimately, we agreed to continue treatment as scheduled every month Due to stability of disease, I do not plan to repeat imaging study for now

## 2018-04-29 NOTE — Progress Notes (Signed)
[place] OFFICE PROGRESS NOTE  Patient Care Team: Patient, No Pcp Per as PCP - General (General Practice) [name], MD as Consulting Physician (Hematology and Oncology) Eppie Gibson, MD as Attending Physician (Radiation Oncology) Jodi Marble, MD as Consulting Physician (Otolaryngology) Philomena Doheny, MD as Referring Physician (Plastic Surgery) Irene Shipper, MD as Consulting Physician (Gastroenterology) System, Provider Not In  ASSESSMENT & PLAN:  Primary cancer of head and neck [facility]) He has intermittent right neck pain that comes and goes Clinically, his neck is thickened & woody from prior surgery and fibrosis from radiation Last PET CT scan from 06/03/17 showed no evidence of disease progression Ultimately, we agreed to continue treatment as scheduled every month Due to stability of disease, I do not plan to repeat imaging study for now   Acquired hypothyroidism The patient has been compliant taking medications as directed I will continue to monitor TSH and adjust his thyroid medicine as needed  Metastasis to lung His symptoms of intermittent chest wall pain is likely related to prior surgery.  The patient is reassured His last imaging showed no evidence of disease progression He is not symptomatic. I do not plan to repeat imaging study for a while.  Neck pain on right side The patient has significant fibrosis and scar tissue from prior surgery and radiation. I recommend continue conservative management with physical therapy, topical lidocaine, muscle relaxant and pain medicine as needed The patient is taking diclofenac and I recommend not more than twice a day and to be taken with food if possible   No orders of the defined types were placed in this encounter.   INTERVAL HISTORY: Please see below for problem oriented charting. 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 Pan-endoscopy elsewhere showed cancer from nasopharyngeal space.      01/04/2007 - 02/20/2007 Chemotherapy    He received 2 cycles of cisplatin and 5FU followed by concurrent chemo with weekly cisplatin and radiation. He only received 2 doses of chemo due to severe mucositis, nausea and weight loss.      04/05/2007 - 08/04/2007 Chemotherapy    He received 4 more courses of cisplatin with 5FU and had complete response      07/05/2009 Procedure    Fine-needle aspirate of the right level II lymph nodes come from recurrent metastatic disease. Repeat endoscopy and CT scan show no evidence of disease elsewhere.      07/08/2009 - 12/02/2009 Chemotherapy    He was given 6 cycles of carboplatin, 5-FU and docetaxel      12/03/2009 Surgery    He has surgery to the residual lymph node on the  right neck which showed no evidence of disease.      02/22/2012 Imaging    Repeat imaging study showed large recurrent mass. He was referred elsewhere for further treatment.      05/03/2012 Surgery    He underwent left upper lobectomy.      04/29/2013 Imaging    PEt scan showed lesion on right level II B and lower lung was abnormal      06/03/2013 - 02/02/2014 Chemotherapy    He had 6 cycles of  chemotherapy when he was found to have recurrence of cancer and had received oxaliplatin and capecitabine      06/07/2014 Imaging    PET CT scan showed persistent disease in the right neck lymph nodes and left lung      06/29/2014 Procedure    Accession: [id number] repeat LUL biopsy confirmed metastatic cancer      07/18/2014 - 07/31/2014 Radiation Therapy    He received palliative radiation therapy to the lungs      10/10/2014 Imaging    CT scan of the chest, abdomen and pelvis show regression in the size of the lung nodule in the left upper lobe and stable pulmonary nodules      01/24/2015 Imaging    CT scan showed stable disease in neck and lung      06/19/2015 Imaging    CT scan of the neck and the chest show possible mild progression of the nodule in the right side of the neck.      06/25/2015 Imaging    PET scan confirmed disease recurrence in the neck      07/07/2015 Imaging    He had MRI neck at [facility]      09/03/2015 -  Chemotherapy    He received palliative chemo with Nivolumab      10/29/2015 Imaging    PET CT showed positive response to Rx      02/28/2016 Imaging    Ct abdomen showed abnormal thinkening in his stomach      03/03/2016 Imaging    CT: Right sternocleidomastoid muscle metastasis appears less distinct but otherwise not significantly changed in size or configuration since 06/19/2015.2. Left level 3 lymph node which was hypermetabolic by PET-CT in January 2017 appears slightly smaller      04/01/2016 Imaging    CT cervical spine showed no acute fracture or traumatic malalignment in the cervical spine      04/22/2016 Procedure    Port-a-cath placed.      06/16/2016 Imaging    Ct neck showed right sternocleidomastoid muscle metastasis is further decreased in conspicuity since May, and has mildly decreased in size since September 2016. Continued stability of sub-centimeter left cervical lymph nodes. No new or progressive metastatic disease in the neck.       06/16/2016 Imaging    CT chest showed stable masslike radiation fibrosis in the left upper lobe. Stable subcentimeter pulmonary nodules in the bilateral lower lobes. No new or progressive metastatic disease in the chest. Nonobstructing left renal stone.      10/13/2016 Imaging    Ct neck showed unchanged right sternocleidomastoid muscle metastasis. Unchanged subcentimeter left cervical lymph nodes. No evidence of new or progressive metastatic disease in the neck.      10/13/2016 Imaging    CT chest showed tiny hypervascular foci in the liver, not definitely seen on prior imaging of 06/16/2016 and 02/28/2016. Abdomen MRI without and with contrast recommended to further evaluate as metastatic disease is a concern. 2.  Stable appearance of post treatment changes left upper lung and scattered tiny bilateral pulmonary nodules.      02/11/2017 Imaging    Ct neck: Lymph node mass right posterior neck appears improved from the prior study. Small posterior lymph nodes on the left unchanged. Occluded right jugular vein unchanged.      02/11/2017 Imaging    1. Similar appearance of postsurgical and radiation changes in the left upper lobe. 2. Similar bilateral pulmonary nodules. 3. No thoracic adenopathy. 4. Subtle foci of post-contrast enhancement within the liver are suboptimally characterized on this nondedicated study. Likely similar. These could either be re-evaluated at followup or more entirely characterized with abdominal MRI. 5. Left nephrolithiasis.      05/19/2017 Imaging    Matted lymph node mass right posterior neck appears larger in the recent CT. Accurate measurements difficult due to infiltrating tumor margins and infiltration of the muscle. Right jugular vein again appears occluded or resected. Small left posterior lymph nodes stable. Left upper lobe airspace density stable and similar to the prior CT      06/03/2017 PET scan    1. Hypermetabolic ill-defined right level IIb lymph  node, about 1.3 cm in diameter with maximum SUV 9.5 (formerly 8.1). Appearance suspicious for residual/recurrent malignancy. No worrisome left-sided lesion. 2. Left suprahilar indistinct opacity demonstrates no worrisome hypermetabolic activity. The 5 mm left lower lobe pulmonary nodule is stable and not currently hypermetabolic although below sensitive PET-CT size thresholds. 3. Other imaging findings of potential clinical significance: Bilateral nonobstructive nephrolithiasis. Chronic bilateral maxillary sinusitis.       REVIEW OF SYSTEMS:   Constitutional: Denies fevers, chills or abnormal weight loss Eyes: Denies blurriness of vision Ears, nose, mouth, throat, and face: Denies mucositis or sore throat Respiratory: Denies cough, dyspnea or wheezes Cardiovascular: Denies palpitation, chest discomfort or lower extremity swelling Gastrointestinal:  Denies nausea, heartburn or change in bowel habits Skin: Denies abnormal skin rashes Lymphatics: Denies new lymphadenopathy or easy bruising Neurological:Denies numbness, tingling or new weaknesses Behavioral/Psych: Mood is stable, no new changes  All other systems were reviewed with the patient and are negative.  I have reviewed the past medical history, past surgical history, social history and family history with the patient and they are unchanged from previous note.  ALLERGIES:  is allergic to heparin.  MEDICATIONS:  Current Outpatient Medications  Medication Sig Dispense Refill  . cyclobenzaprine (FLEXERIL) 5 MG tablet Take 1 tablet (5 mg total) by mouth 3 (three) times daily as needed for muscle spasms. 60 tablet 0  . diclofenac (VOLTAREN) 50 MG EC tablet TAKE 1 TABLET BY MOUTH 3 TIMES DAILY AS NEEDED FOR MILD PAIN. 60 tablet 1  . famotidine (PEPCID) 20 MG tablet Take 1 tablet (20 mg total) by mouth 2 (two) times daily. 30 tablet 0  . levothyroxine (SYNTHROID, LEVOTHROID) 125 MCG tablet Take 1 tablet (125 mcg total) daily before  breakfast by mouth. 30 tablet 9  . lidocaine-prilocaine (EMLA) cream Apply 1 application topically as needed. (Patient taking differently: Apply 1 application topically as needed (for port access). ) 30 g 3  . meclizine (ANTIVERT) 25 MG tablet Take 1 tablet (25 mg total) by mouth 3 (three) times daily as needed for dizziness. 30 tablet 0  . naproxen (NAPROSYN) 500 MG tablet Take 1 tablet (500 mg total) by mouth 2 (two) times daily as needed. 30 tablet 0  . omeprazole (PRILOSEC) 40 MG capsule Take 1 capsule (40 mg total) by mouth daily. 30 capsule 5  .  oxyCODONE (ROXICODONE) 15 MG immediate release tablet Take 1 tablet (15 mg total) by mouth every 4 (four) hours as needed for pain. 60 tablet 0  . polyethylene glycol (MIRALAX) packet Take 17 g by mouth daily. 14 each 3   No current facility-administered medications for this visit.    Facility-Administered Medications Ordered in Other Visits  Medication Dose Route Frequency Provider Last Rate Last Dose  . sodium chloride 0.9 % injection 10 mL  10 mL Intracatheter PRN Alvy Bimler, Dagmawi Venable, MD        PHYSICAL EXAMINATION: ECOG PERFORMANCE STATUS: 1 - Symptomatic but completely ambulatory  Vitals:   04/28/18 1234  BP: 100/68  Pulse: 81  Resp: 18  Temp: 98 F (36.7 C)  SpO2: 100%   Filed Weights   04/28/18 1234  Weight: 180 lb 12.8 oz (82 kg)    GENERAL:alert, no distress and comfortable SKIN: skin color, texture, turgor are normal, no rashes or significant lesions EYES: normal, Conjunctiva are pink and non-injected, sclera clear OROPHARYNX:no exudate, no erythema and lips, buccal mucosa, and tongue normal  NECK: Neck is thick and woody from prior surgery and radiation.  No other abnormalities LYMPH:  no palpable lymphadenopathy in the cervical, axillary or inguinal LUNGS: clear to auscultation and percussion with normal breathing effort HEART: regular rate & rhythm and no murmurs and no lower extremity edema ABDOMEN:abdomen soft, non-tender  and normal bowel sounds Musculoskeletal:no cyanosis of digits and no clubbing  NEURO: alert & oriented x 3 with fluent speech, no focal motor/sensory deficits  LABORATORY DATA:  I have reviewed the data as listed    Component Value Date/Time   NA 138 04/28/2018 1138   NA 139 09/22/2017 0829   K 4.0 04/28/2018 1138   K 3.5 09/22/2017 0829   CL 102 04/28/2018 1138   CO2 28 04/28/2018 1138   CO2 26 09/22/2017 0829   GLUCOSE 112 (H) 04/28/2018 1138   GLUCOSE 133 09/22/2017 0829   BUN 14 04/28/2018 1138   BUN 14.1 09/22/2017 0829   CREATININE 0.99 04/28/2018 1138   CREATININE 0.9 09/22/2017 0829   CALCIUM 10.0 04/28/2018 1138   CALCIUM 9.1 09/22/2017 0829   PROT 7.5 04/28/2018 1138   PROT 6.8 09/22/2017 0829   ALBUMIN 4.5 04/28/2018 1138   ALBUMIN 4.1 09/22/2017 0829   AST 26 04/28/2018 1138   AST 22 09/22/2017 0829   ALT 30 04/28/2018 1138   ALT 30 09/22/2017 0829   ALKPHOS 66 04/28/2018 1138   ALKPHOS 55 09/22/2017 0829   BILITOT 0.4 04/28/2018 1138   BILITOT 0.35 09/22/2017 0829   GFRNONAA >60 04/28/2018 1138   GFRAA >60 04/28/2018 1138    No results found for: SPEP, UPEP  Lab Results  Component Value Date   WBC 5.1 04/28/2018   NEUTROABS 3.1 04/28/2018   HGB 15.3 04/28/2018   HCT 45.2 04/28/2018   MCV 88.3 04/28/2018   PLT 229 04/28/2018      Chemistry      Component Value Date/Time   NA 138 04/28/2018 1138   NA 139 09/22/2017 0829   K 4.0 04/28/2018 1138   K 3.5 09/22/2017 0829   CL 102 04/28/2018 1138   CO2 28 04/28/2018 1138   CO2 26 09/22/2017 0829   BUN 14 04/28/2018 1138   BUN 14.1 09/22/2017 0829   CREATININE 0.99 04/28/2018 1138   CREATININE 0.9 09/22/2017 0829      Component Value Date/Time   CALCIUM 10.0 04/28/2018 1138   CALCIUM 9.1 09/22/2017  0829   ALKPHOS 66 04/28/2018 1138   ALKPHOS 55 09/22/2017 0829   AST 26 04/28/2018 1138   AST 22 09/22/2017 0829   ALT 30 04/28/2018 1138   ALT 30 09/22/2017 0829   BILITOT 0.4 04/28/2018 1138    BILITOT 0.35 09/22/2017 0829       All questions were answered. The patient knows to call the clinic with any problems, questions or concerns. No barriers to learning was detected.  I spent 15 minutes counseling the patient face to face. The total time spent in the appointment was 20 minutes and more than 50% was on counseling and review of test results  [name], MD 04/29/2018 8:35 AM

## 2018-04-29 NOTE — Assessment & Plan Note (Signed)
The patient has been compliant taking medications as directed I will continue to monitor TSH and adjust his thyroid medicine as needed

## 2018-05-11 ENCOUNTER — Ambulatory Visit (HOSPITAL_COMMUNITY)
Admission: RE | Admit: 2018-05-11 | Discharge: 2018-05-11 | Disposition: A | Discharge status: Home / Self Care | Payer: Medicaid Other | Source: Ambulatory Visit | Attending: Hematology and Oncology | Admitting: Hematology and Oncology

## 2018-05-11 DIAGNOSIS — N200 Calculus of kidney: Secondary | ICD-10-CM | POA: Diagnosis not present

## 2018-05-11 DIAGNOSIS — C7800 Secondary malignant neoplasm of unspecified lung: Secondary | ICD-10-CM | POA: Diagnosis present

## 2018-05-11 DIAGNOSIS — C760 Malignant neoplasm of head, face and neck: Secondary | ICD-10-CM | POA: Diagnosis present

## 2018-05-11 DIAGNOSIS — J320 Chronic maxillary sinusitis: Secondary | ICD-10-CM | POA: Diagnosis not present

## 2018-05-11 DIAGNOSIS — C119 Malignant neoplasm of nasopharynx, unspecified: Secondary | ICD-10-CM | POA: Insufficient documentation

## 2018-05-11 LAB — GLUCOSE, CAPILLARY: Glucose-Capillary: 110 mg/dL — ABNORMAL HIGH (ref 70–99)

## 2018-05-11 MED ORDER — FLUDEOXYGLUCOSE F - 18 (FDG) INJECTION
8.9000 | Freq: Once | INTRAVENOUS | Status: DC | PRN
Start: 2018-05-11 — End: 2018-05-17

## 2018-05-12 ENCOUNTER — Other Ambulatory Visit: Payer: Medicaid Other

## 2018-05-12 ENCOUNTER — Ambulatory Visit: Payer: Medicaid Other

## 2018-05-12 ENCOUNTER — Ambulatory Visit: Payer: Medicaid Other | Admitting: Hematology and Oncology

## 2018-05-16 ENCOUNTER — Telehealth: Payer: Self-pay | Admitting: *Deleted

## 2018-05-16 MED ORDER — OXYCODONE HCL 15 MG PO TABS: 15.0000 mg | ORAL_TABLET | ORAL | 0 refills | Status: DC | PRN

## 2018-05-16 NOTE — Telephone Encounter (Signed)
Pt called for refill of oxycodone 15 mg

## 2018-06-03 ENCOUNTER — Inpatient Hospital Stay: Payer: Medicaid Other | Attending: Hematology and Oncology

## 2018-06-03 ENCOUNTER — Encounter: Payer: Self-pay | Admitting: Hematology and Oncology

## 2018-06-03 ENCOUNTER — Inpatient Hospital Stay: Payer: Medicaid Other

## 2018-06-03 ENCOUNTER — Inpatient Hospital Stay (HOSPITAL_BASED_OUTPATIENT_CLINIC_OR_DEPARTMENT_OTHER): Payer: Medicaid Other | Admitting: Hematology and Oncology

## 2018-06-03 ENCOUNTER — Telehealth: Payer: Self-pay | Admitting: Hematology and Oncology

## 2018-06-03 DIAGNOSIS — E039 Hypothyroidism, unspecified: Secondary | ICD-10-CM | POA: Diagnosis not present

## 2018-06-03 DIAGNOSIS — Z5112 Encounter for antineoplastic immunotherapy: Secondary | ICD-10-CM | POA: Insufficient documentation

## 2018-06-03 DIAGNOSIS — C7989 Secondary malignant neoplasm of other specified sites: Secondary | ICD-10-CM | POA: Insufficient documentation

## 2018-06-03 DIAGNOSIS — Z95828 Presence of other vascular implants and grafts: Secondary | ICD-10-CM

## 2018-06-03 DIAGNOSIS — M542 Cervicalgia: Secondary | ICD-10-CM

## 2018-06-03 DIAGNOSIS — C760 Malignant neoplasm of head, face and neck: Secondary | ICD-10-CM | POA: Diagnosis present

## 2018-06-03 DIAGNOSIS — C7800 Secondary malignant neoplasm of unspecified lung: Secondary | ICD-10-CM

## 2018-06-03 DIAGNOSIS — C7802 Secondary malignant neoplasm of left lung: Secondary | ICD-10-CM | POA: Insufficient documentation

## 2018-06-03 LAB — COMPREHENSIVE METABOLIC PANEL
ALBUMIN: 4.1 g/dL (ref 3.5–5.0)
ALK PHOS: 57 U/L (ref 38–126)
ALT: 37 U/L (ref 0–44)
AST: 26 U/L (ref 15–41)
Anion gap: 10 (ref 5–15)
BUN: 15 mg/dL (ref 6–20)
CHLORIDE: 103 mmol/L (ref 98–111)
CO2: 27 mmol/L (ref 22–32)
Calcium: 9.4 mg/dL (ref 8.9–10.3)
Creatinine, Ser: 0.97 mg/dL (ref 0.61–1.24)
GFR calc Af Amer: >60 mL/min (ref >60)
GFR calc non Af Amer: >60 mL/min (ref >60)
GLUCOSE: 157 mg/dL — AB (ref 70–99)
Potassium: 3.8 mmol/L (ref 3.5–5.1)
Sodium: 140 mmol/L (ref 135–145)
Total Bilirubin: 0.3 mg/dL (ref 0.3–1.2)
Total Protein: 6.9 g/dL (ref 6.5–8.1)

## 2018-06-03 LAB — CBC WITH DIFFERENTIAL/PLATELET
BASOS PCT: 1 %
Basophils Absolute: 0 10*3/uL (ref 0.0–0.1)
EOS ABS: 0.1 10*3/uL (ref 0.0–0.5)
Eosinophils Relative: 3 %
HEMATOCRIT: 43.2 % (ref 38.4–49.9)
HEMOGLOBIN: 14.3 g/dL (ref 13.0–17.1)
LYMPHS ABS: 1.8 10*3/uL (ref 0.9–3.3)
Lymphocytes Relative: 36 %
MCH: 29.9 pg (ref 27.2–33.4)
MCHC: 33.1 g/dL (ref 32.0–36.0)
MCV: 90.2 fL (ref 79.3–98.0)
Monocytes Absolute: 0.3 10*3/uL (ref 0.1–0.9)
Monocytes Relative: 7 %
NEUTROS PCT: 53 %
Neutro Abs: 2.7 10*3/uL (ref 1.5–6.5)
PLATELETS: 180 10*3/uL (ref 140–400)
RBC: 4.79 MIL/uL (ref 4.20–5.82)
RDW: 13.6 % (ref 11.0–14.6)
WBC: 5 10*3/uL (ref 4.0–10.3)

## 2018-06-03 LAB — TSH: TSH: 1.104 u[IU]/mL (ref 0.320–4.118)

## 2018-06-03 MED ORDER — SODIUM CHLORIDE 0.9 % IV SOLN
Freq: Once | INTRAVENOUS | Status: AC
  Administered 2018-06-03: 14:00:00 via INTRAVENOUS
  Filled 2018-06-03: qty 250

## 2018-06-03 MED ORDER — ANTICOAGULANT SODIUM CITRATE 4% (200MG/5ML) IV SOLN
5.0000 mL | Freq: Once | Status: AC
  Administered 2018-06-03: 5 mL via INTRAVENOUS
  Filled 2018-06-03: qty 5

## 2018-06-03 MED ORDER — SODIUM CHLORIDE 0.9 % IV SOLN
240.0000 mg | Freq: Once | INTRAVENOUS | Status: AC
  Administered 2018-06-03: 240 mg via INTRAVENOUS
  Filled 2018-06-03: qty 24

## 2018-06-03 MED ORDER — SODIUM CHLORIDE 0.9% FLUSH
10.0000 mL | Freq: Once | INTRAVENOUS | Status: AC
  Administered 2018-06-03: 10 mL
  Filled 2018-06-03: qty 10

## 2018-06-03 MED ORDER — SODIUM CHLORIDE 0.9 % IJ SOLN
10.0000 mL | INTRAMUSCULAR | Status: DC | PRN
  Administered 2018-06-03: 10 mL
  Filled 2018-06-03: qty 10

## 2018-06-03 NOTE — Patient Instructions (Addendum)
[facility] Discharge Instructions for Patients Receiving Chemotherapy  Today you received the following chemotherapy agents: nivolumab.  To help prevent nausea and vomiting after your treatment, we encourage you to take your nausea medication as directed.   If you develop nausea and vomiting that is not controlled by your nausea medication, call the clinic.   BELOW ARE SYMPTOMS THAT SHOULD BE REPORTED IMMEDIATELY:  *FEVER GREATER THAN 100.5 F  *CHILLS WITH OR WITHOUT FEVER  NAUSEA AND VOMITING THAT IS NOT CONTROLLED WITH YOUR NAUSEA MEDICATION  *UNUSUAL SHORTNESS OF BREATH  *UNUSUAL BRUISING OR BLEEDING  TENDERNESS IN MOUTH AND THROAT WITH OR WITHOUT PRESENCE OF ULCERS  *URINARY PROBLEMS  *BOWEL PROBLEMS  UNUSUAL RASH Items with * indicate a potential emergency and should be followed up as soon as possible.  Feel free to call the clinic should you have any questions or concerns. The clinic phone number is [phone].  Please show the CHEMO ALERT CARD at check-in to the Emergency Department and triage nurse.   

## 2018-06-03 NOTE — Telephone Encounter (Signed)
Gave pt avs and calendar  °

## 2018-06-03 NOTE — Assessment & Plan Note (Signed)
He has intermittent right neck pain that comes and goes Clinically, his neck is thickened & woody from prior surgery and fibrosis from radiation PET CT scan showed no evidence of disease Ultimately, we agreed to continue treatment as scheduled every month Due to stability of disease, I do not plan to repeat imaging study for another year

## 2018-06-04 ENCOUNTER — Encounter: Payer: Self-pay | Admitting: Hematology and Oncology

## 2018-06-04 NOTE — Assessment & Plan Note (Signed)
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 I recommend continue conservative management with physical therapy, topical lidocaine, muscle relaxant and pain medicine as needed The patient is prescribed pain medicine as needed.

## 2018-06-04 NOTE — Progress Notes (Signed)
[facility] OFFICE PROGRESS NOTE  Patient Care Team: Patient, No Pcp Per as PCP - General (General Practice) [name], MD as Consulting Physician (Hematology and Oncology) Eppie Gibson, MD as Attending Physician (Radiation Oncology) Jodi Marble, MD as Consulting Physician (Otolaryngology) Philomena Doheny, MD as Referring Physician (Plastic Surgery) Irene Shipper, MD as Consulting Physician (Gastroenterology) System, Provider Not In  ASSESSMENT & PLAN:  Primary cancer of head and neck [facility]) He has intermittent right neck pain that comes and goes Clinically, his neck is thickened & woody from prior surgery and fibrosis from radiation PET CT scan showed no evidence of disease Ultimately, we agreed to continue treatment as scheduled every month Due to stability of disease, I do not plan to repeat imaging study for another year   Acquired hypothyroidism The patient has been compliant taking medications as directed I will continue to monitor TSH and adjust his thyroid medicine as needed  Neck pain on right side The patient has significant fibrosis and scar tissue from prior surgery and radiation. I recommend continue conservative management with physical therapy, topical lidocaine, muscle relaxant and pain medicine as needed The patient is prescribed pain medicine as needed.   No orders of the defined types were placed in this encounter.   INTERVAL HISTORY: Please see below for problem oriented charting. He returns for further follow-up and review of imaging study He feels well No recent infusion reactions He continues to have chronic neck pain.  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 Pan-endoscopy elsewhere showed cancer from nasopharyngeal space.    01/04/2007 - 02/20/2007 Chemotherapy    He received 2 cycles of cisplatin and 5FU followed by concurrent chemo with weekly cisplatin and radiation. He only received 2 doses of chemo due to severe mucositis, nausea and weight loss.    04/05/2007 - 08/04/2007 Chemotherapy    He received 4 more courses of cisplatin with 5FU and had complete response    07/05/2009 Procedure    Fine-needle aspirate of the right level II lymph nodes come from recurrent metastatic disease. Repeat endoscopy and CT scan show no evidence of disease elsewhere.    07/08/2009 - 12/02/2009 Chemotherapy    He was given 6 cycles of carboplatin, 5-FU and docetaxel    12/03/2009 Surgery    He has surgery to the residual lymph node on the right neck which showed no evidence of disease.    02/22/2012 Imaging    Repeat imaging study showed large recurrent mass. He was referred elsewhere for further treatment.    05/03/2012 Surgery    He underwent left upper lobectomy.    04/29/2013 Imaging    PEt scan showed lesion on right level II B and lower lung was abnormal    06/03/2013 - 02/02/2014 Chemotherapy    He had 6 cycles of chemotherapy when he was found to have recurrence of cancer and had received oxaliplatin and capecitabine    06/07/2014 Imaging  PET CT scan showed persistent disease in the right neck lymph nodes and left lung    06/29/2014 Procedure    Accession: [id number] repeat LUL biopsy confirmed metastatic cancer    07/18/2014 - 07/31/2014 Radiation Therapy    He received palliative radiation therapy to the lungs    10/10/2014 Imaging    CT scan of the chest, abdomen and pelvis show regression in the size of the lung nodule in the left upper lobe  and stable pulmonary nodules    01/24/2015 Imaging    CT scan showed stable disease in neck and lung    06/19/2015 Imaging    CT scan of the neck and the chest show possible mild progression of the nodule in the right side of the neck.    06/25/2015 Imaging    PET scan confirmed disease recurrence in the neck    07/07/2015 Imaging    He had MRI neck at [facility]    09/03/2015 -  Chemotherapy    He received palliative chemo with Nivolumab    10/29/2015 Imaging    PET CT showed positive response to Rx    02/28/2016 Imaging    Ct abdomen showed abnormal thinkening in his stomach    03/03/2016 Imaging    CT: Right sternocleidomastoid muscle metastasis appears less distinct but otherwise not significantly changed in size or configuration since 06/19/2015.2. Left level 3 lymph node which was hypermetabolic by PET-CT in January 2017 appears slightly smaller    04/01/2016 Imaging    CT cervical spine showed no acute fracture or traumatic malalignment in the cervical spine    04/22/2016 Procedure    Port-a-cath placed.    06/16/2016 Imaging    Ct neck showed right sternocleidomastoid muscle metastasis is further decreased in conspicuity since May, and has mildly decreased in size since September 2016. Continued stability of sub-centimeter left cervical lymph nodes. No new or progressive metastatic disease in the neck.    06/16/2016 Imaging    CT chest showed stable masslike radiation fibrosis in the left upper lobe. Stable subcentimeter pulmonary nodules in the bilateral lower lobes. No new or progressive metastatic disease in the chest. Nonobstructing left renal stone.    10/13/2016 Imaging    Ct neck showed unchanged right sternocleidomastoid muscle metastasis. Unchanged subcentimeter left cervical lymph nodes. No evidence of new or progressive metastatic disease in the neck.    10/13/2016 Imaging    CT chest showed tiny hypervascular foci in the liver, not definitely seen on prior imaging of  06/16/2016 and 02/28/2016. Abdomen MRI without and with contrast recommended to further evaluate as metastatic disease is a concern. 2. Stable appearance of post treatment changes left upper lung and scattered tiny bilateral pulmonary nodules.    02/11/2017 Imaging    Ct neck: Lymph node mass right posterior neck appears improved from the prior study. Small posterior lymph nodes on the left unchanged. Occluded right jugular vein unchanged.    02/11/2017 Imaging    1. Similar appearance of postsurgical and radiation changes in the left upper lobe. 2. Similar bilateral pulmonary nodules. 3. No thoracic adenopathy. 4. Subtle foci of post-contrast enhancement within the liver are suboptimally characterized on this nondedicated study. Likely similar. These could either be re-evaluated at followup or more entirely characterized with abdominal MRI. 5. Left nephrolithiasis.    05/19/2017 Imaging    Matted lymph node mass right posterior neck appears larger in the recent CT. Accurate measurements difficult due to infiltrating tumor margins and infiltration of the  muscle. Right jugular vein again appears occluded or resected. Small left posterior lymph nodes stable. Left upper lobe airspace density stable and similar to the prior CT    06/03/2017 PET scan    1. Hypermetabolic ill-defined right level IIb lymph node, about 1.3 cm in diameter with maximum SUV 9.5 (formerly 8.1). Appearance suspicious for residual/recurrent malignancy. No worrisome left-sided lesion. 2. Left suprahilar indistinct opacity demonstrates no worrisome hypermetabolic activity. The 5 mm left lower lobe pulmonary nodule is stable and not currently hypermetabolic although below sensitive PET-CT size thresholds. 3. Other imaging findings of potential clinical significance: Bilateral nonobstructive nephrolithiasis. Chronic bilateral maxillary sinusitis.    05/11/2018 PET scan    1. Continued chronic accentuated metabolic activity in the  vicinity of right level IIB and the adjacent right sternocleidomastoid muscle, with ill definition of surrounding tissue planes. Maximum SUV is currently 8.1, formerly 9.5. Accentuated metabolic activity is been present in this vicinity back through 06/25/2015, and there was also some low-level activity in this vicinity on 06/07/2014. Some of this may be from scarring and local muscular activity although clearly a component of residual tumor is difficult to exclude given the focally high activity. 2. Other imaging findings of potential clinical significance: Chronic bilateral maxillary sinusitis. Chronic scarring in the left upper lobe. Chronically stable 5 mm left lower lobe nodule is considered benign. Nonobstructive left nephrolithiasis.     REVIEW OF SYSTEMS:   Constitutional: Denies fevers, chills or abnormal weight loss Eyes: Denies blurriness of vision Ears, nose, mouth, throat, and face: Denies mucositis or sore throat Respiratory: Denies cough, dyspnea or wheezes Cardiovascular: Denies palpitation, chest discomfort or lower extremity swelling Gastrointestinal:  Denies nausea, heartburn or change in bowel habits Skin: Denies abnormal skin rashes Lymphatics: Denies new lymphadenopathy or easy bruising Neurological:Denies numbness, tingling or new weaknesses Behavioral/Psych: Mood is stable, no new changes  All other systems were reviewed with the patient and are negative.  I have reviewed the past medical history, past surgical history, social history and family history with the patient and they are unchanged from previous note.  ALLERGIES:  is allergic to heparin.  MEDICATIONS:  Current Outpatient Medications  Medication Sig Dispense Refill  . cyclobenzaprine (FLEXERIL) 5 MG tablet Take 1 tablet (5 mg total) by mouth 3 (three) times daily as needed for muscle spasms. 60 tablet 0  . diclofenac (VOLTAREN) 50 MG EC tablet TAKE 1 TABLET BY MOUTH 3 TIMES DAILY AS NEEDED FOR MILD PAIN. 60  tablet 1  . famotidine (PEPCID) 20 MG tablet Take 1 tablet (20 mg total) by mouth 2 (two) times daily. 30 tablet 0  . levothyroxine (SYNTHROID, LEVOTHROID) 125 MCG tablet Take 1 tablet (125 mcg total) daily before breakfast by mouth. 30 tablet 9  . lidocaine-prilocaine (EMLA) cream Apply 1 application topically as needed. (Patient taking differently: Apply 1 application topically as needed (for port access). ) 30 g 3  . meclizine (ANTIVERT) 25 MG tablet Take 1 tablet (25 mg total) by mouth 3 (three) times daily as needed for dizziness. 30 tablet 0  . naproxen (NAPROSYN) 500 MG tablet Take 1 tablet (500 mg total) by mouth 2 (two) times daily as needed. 30 tablet 0  . omeprazole (PRILOSEC) 40 MG capsule Take 1 capsule (40 mg total) by mouth daily. 30 capsule 5  . oxyCODONE (ROXICODONE) 15 MG immediate release tablet Take 1 tablet (15 mg total) by mouth every 4 (four) hours as needed for pain. 60 tablet 0  . polyethylene glycol (MIRALAX)  packet Take 17 g by mouth daily. 14 each 3   No current facility-administered medications for this visit.    Facility-Administered Medications Ordered in Other Visits  Medication Dose Route Frequency Provider Last Rate Last Dose  . sodium chloride 0.9 % injection 10 mL  10 mL Intracatheter PRN Alvy Bimler, Howell Groesbeck, MD        PHYSICAL EXAMINATION: ECOG PERFORMANCE STATUS: 1 - Symptomatic but completely ambulatory  Vitals:   06/03/18 1239  BP: 128/86  Pulse: 76  Resp: 18  Temp: 97.7 F (36.5 C)  SpO2: 100%   Filed Weights   06/03/18 1239  Weight: 186 lb 11.2 oz (84.7 kg)    GENERAL:alert, no distress and comfortable SKIN: skin color, texture, turgor are normal, no rashes or significant lesions EYES: normal, Conjunctiva are pink and non-injected, sclera clear OROPHARYNX:no exudate, no erythema and lips, buccal mucosa, and tongue normal  NECK: He has significant signs of neck fibrosis, unchanged compared to prior exam  LYMPH:  no palpable lymphadenopathy in  the cervical, axillary or inguinal LUNGS: clear to auscultation and percussion with normal breathing effort HEART: regular rate & rhythm and no murmurs and no lower extremity edema ABDOMEN:abdomen soft, non-tender and normal bowel sounds Musculoskeletal:no cyanosis of digits and no clubbing  NEURO: alert & oriented x 3 with fluent speech, no focal motor/sensory deficits  LABORATORY DATA:  I have reviewed the data as listed    Component Value Date/Time   NA 140 06/03/2018 1146   NA 139 09/22/2017 0829   K 3.8 06/03/2018 1146   K 3.5 09/22/2017 0829   CL 103 06/03/2018 1146   CO2 27 06/03/2018 1146   CO2 26 09/22/2017 0829   GLUCOSE 157 (H) 06/03/2018 1146   GLUCOSE 133 09/22/2017 0829   BUN 15 06/03/2018 1146   BUN 14.1 09/22/2017 0829   CREATININE 0.97 06/03/2018 1146   CREATININE 0.9 09/22/2017 0829   CALCIUM 9.4 06/03/2018 1146   CALCIUM 9.1 09/22/2017 0829   PROT 6.9 06/03/2018 1146   PROT 6.8 09/22/2017 0829   ALBUMIN 4.1 06/03/2018 1146   ALBUMIN 4.1 09/22/2017 0829   AST 26 06/03/2018 1146   AST 22 09/22/2017 0829   ALT 37 06/03/2018 1146   ALT 30 09/22/2017 0829   ALKPHOS 57 06/03/2018 1146   ALKPHOS 55 09/22/2017 0829   BILITOT 0.3 06/03/2018 1146   BILITOT 0.35 09/22/2017 0829   GFRNONAA >60 06/03/2018 1146   GFRAA >60 06/03/2018 1146    No results found for: SPEP, UPEP  Lab Results  Component Value Date   WBC 5.0 06/03/2018   NEUTROABS 2.7 06/03/2018   HGB 14.3 06/03/2018   HCT 43.2 06/03/2018   MCV 90.2 06/03/2018   PLT 180 06/03/2018      Chemistry      Component Value Date/Time   NA 140 06/03/2018 1146   NA 139 09/22/2017 0829   K 3.8 06/03/2018 1146   K 3.5 09/22/2017 0829   CL 103 06/03/2018 1146   CO2 27 06/03/2018 1146   CO2 26 09/22/2017 0829   BUN 15 06/03/2018 1146   BUN 14.1 09/22/2017 0829   CREATININE 0.97 06/03/2018 1146   CREATININE 0.9 09/22/2017 0829      Component Value Date/Time   CALCIUM 9.4 06/03/2018 1146   CALCIUM  9.1 09/22/2017 0829   ALKPHOS 57 06/03/2018 1146   ALKPHOS 55 09/22/2017 0829   AST 26 06/03/2018 1146   AST 22 09/22/2017 0829   ALT 37 06/03/2018 1146  ALT 30 09/22/2017 0829   BILITOT 0.3 06/03/2018 1146   BILITOT 0.35 09/22/2017 0829       RADIOGRAPHIC STUDIES: I have reviewed multiple imaging studies with the patient I have personally reviewed the radiological images as listed and agreed with the findings in the report. Nm Pet Image Restag (ps) Skull Base To Thigh  Result Date: 05/11/2018 CLINICAL DATA:  Subsequent treatment strategy for nasopharyngeal cancer. EXAM: NUCLEAR MEDICINE PET SKULL BASE TO THIGH TECHNIQUE: 8.9 mCi F-18 FDG was injected intravenously. Full-ring PET imaging was performed from the skull base to thigh after the radiotracer. CT data was obtained and used for attenuation correction and anatomic localization. Fasting blood glucose: 110 mg/dl COMPARISON:  06/03/2017 FINDINGS: Mediastinal blood pool activity: SUV max 1.3 NECK: Indistinctly marginated soft tissue mass in the vicinity of level II B and not separable from the right sternocleidomastoid muscle has a maximum SUV of 8.1, formerly 9.5. The area of hypermetabolic activity measures approximately 2.0 by 2.0 cm, previously about 2.0 by 1.5 cm. Given the ill definition of surrounding tissue planes, there is some possibility that at least part of this activity could be due to muscular activity from the sternocleidomastoid, although the degree of accentuated activity is higher than that of other regions of physiologic muscular activity in the neck. No accentuated activity in the nasopharynx. No other suspicious regions are identified in the neck. Incidental CT findings: Chronic bilateral maxillary sinusitis. CHEST: Postoperative findings in the left upper lobe with associated chronic volume loss but no significant abnormal accentuated metabolic activity. 5 mm left lower lobe nodule on image 33/8 is stable and not  appreciably hypermetabolic. Incidental CT findings: Right Port-A-Cath tip: Right atrium. ABDOMEN/PELVIS: No significant abnormal hypermetabolic activity in this region. Incidental CT findings: Nonobstructive left nephrolithiasis. SKELETON: No significant abnormal hypermetabolic activity in this region. Incidental CT findings: none IMPRESSION: 1. Continued chronic accentuated metabolic activity in the vicinity of right level IIB and the adjacent right sternocleidomastoid muscle, with ill definition of surrounding tissue planes. Maximum SUV is currently 8.1, formerly 9.5. Accentuated metabolic activity is been present in this vicinity back through 06/25/2015, and there was also some low-level activity in this vicinity on 06/07/2014. Some of this may be from scarring and local muscular activity although clearly a component of residual tumor is difficult to exclude given the focally high activity. 2. Other imaging findings of potential clinical significance: Chronic bilateral maxillary sinusitis. Chronic scarring in the left upper lobe. Chronically stable 5 mm left lower lobe nodule is considered benign. Nonobstructive left nephrolithiasis. Electronically Signed   By: Van Clines M.D.   On: 05/11/2018 11:16    All questions were answered. The patient knows to call the clinic with any problems, questions or concerns. No barriers to learning was detected.  I spent 15 minutes counseling the patient face to face. The total time spent in the appointment was 20 minutes and more than 50% was on counseling and review of test results  [name], MD 06/04/2018 6:36 AM

## 2018-06-04 NOTE — Assessment & Plan Note (Signed)
The patient has been compliant taking medications as directed I will continue to monitor TSH and adjust his thyroid medicine as needed

## 2018-06-28 ENCOUNTER — Other Ambulatory Visit: Payer: Self-pay | Admitting: Hematology and Oncology

## 2018-06-28 ENCOUNTER — Telehealth: Payer: Self-pay

## 2018-06-28 MED ORDER — OXYCODONE HCL 15 MG PO TABS: 15.0000 mg | ORAL_TABLET | ORAL | 0 refills | Status: DC | PRN

## 2018-06-28 NOTE — Telephone Encounter (Signed)
Called back. Please sent to [facility] in [place].

## 2018-06-28 NOTE — Telephone Encounter (Signed)
Done

## 2018-06-28 NOTE — Telephone Encounter (Signed)
I can e-scribe narcotic prescriptions now Which pharmacy?

## 2018-06-28 NOTE — Telephone Encounter (Signed)
He called and left a message requesting refill on Oxycodone.

## 2018-07-01 ENCOUNTER — Inpatient Hospital Stay: Payer: Medicaid Other

## 2018-07-01 ENCOUNTER — Inpatient Hospital Stay: Payer: Medicaid Other | Attending: Hematology and Oncology

## 2018-07-01 VITALS — BP 130/78 | HR 69 | Temp 98.3°F | Resp 18

## 2018-07-01 DIAGNOSIS — C760 Malignant neoplasm of head, face and neck: Secondary | ICD-10-CM

## 2018-07-01 DIAGNOSIS — C7989 Secondary malignant neoplasm of other specified sites: Secondary | ICD-10-CM | POA: Insufficient documentation

## 2018-07-01 DIAGNOSIS — Z5112 Encounter for antineoplastic immunotherapy: Secondary | ICD-10-CM | POA: Diagnosis present

## 2018-07-01 DIAGNOSIS — E039 Hypothyroidism, unspecified: Secondary | ICD-10-CM | POA: Diagnosis not present

## 2018-07-01 DIAGNOSIS — Z95828 Presence of other vascular implants and grafts: Secondary | ICD-10-CM

## 2018-07-01 DIAGNOSIS — C7802 Secondary malignant neoplasm of left lung: Secondary | ICD-10-CM | POA: Diagnosis not present

## 2018-07-01 DIAGNOSIS — C7800 Secondary malignant neoplasm of unspecified lung: Secondary | ICD-10-CM

## 2018-07-01 LAB — CBC WITH DIFFERENTIAL/PLATELET
Basophils Absolute: 0.1 10*3/uL (ref 0.0–0.1)
Basophils Relative: 1 %
EOS PCT: 2 %
Eosinophils Absolute: 0.1 10*3/uL (ref 0.0–0.5)
HCT: 41.7 % (ref 38.4–49.9)
Hemoglobin: 14 g/dL (ref 13.0–17.1)
LYMPHS ABS: 1.6 10*3/uL (ref 0.9–3.3)
LYMPHS PCT: 26 %
MCH: 29.5 pg (ref 27.2–33.4)
MCHC: 33.7 g/dL (ref 32.0–36.0)
MCV: 87.5 fL (ref 79.3–98.0)
MONOS PCT: 9 %
Monocytes Absolute: 0.5 10*3/uL (ref 0.1–0.9)
Neutro Abs: 3.7 10*3/uL (ref 1.5–6.5)
Neutrophils Relative %: 62 %
Platelets: 201 10*3/uL (ref 140–400)
RBC: 4.76 MIL/uL (ref 4.20–5.82)
RDW: 14.4 % (ref 11.0–14.6)
WBC: 6 10*3/uL (ref 4.0–10.3)

## 2018-07-01 LAB — COMPREHENSIVE METABOLIC PANEL
ALK PHOS: 58 U/L (ref 38–126)
ALT: 32 U/L (ref 0–44)
AST: 26 U/L (ref 15–41)
Albumin: 4.2 g/dL (ref 3.5–5.0)
Anion gap: 9 (ref 5–15)
BUN: 14 mg/dL (ref 6–20)
CALCIUM: 9.6 mg/dL (ref 8.9–10.3)
CO2: 27 mmol/L (ref 22–32)
CREATININE: 0.85 mg/dL (ref 0.61–1.24)
Chloride: 104 mmol/L (ref 98–111)
Glucose, Bld: 94 mg/dL (ref 70–99)
Potassium: 4 mmol/L (ref 3.5–5.1)
Sodium: 140 mmol/L (ref 135–145)
Total Bilirubin: 0.3 mg/dL (ref 0.3–1.2)
Total Protein: 7.1 g/dL (ref 6.5–8.1)

## 2018-07-01 LAB — TSH: TSH: 3.971 u[IU]/mL (ref 0.320–4.118)

## 2018-07-01 MED ORDER — ANTICOAGULANT SODIUM CITRATE 4% (200MG/5ML) IV SOLN
5.0000 mL | Freq: Once | Status: AC
  Administered 2018-07-01: 5 mL via INTRAVENOUS
  Filled 2018-07-01: qty 5

## 2018-07-01 MED ORDER — SODIUM CHLORIDE 0.9% FLUSH
10.0000 mL | Freq: Once | INTRAVENOUS | Status: AC
  Administered 2018-07-01: 10 mL
  Filled 2018-07-01: qty 10

## 2018-07-01 MED ORDER — SODIUM CHLORIDE 0.9 % IV SOLN
Freq: Once | INTRAVENOUS | Status: AC
  Administered 2018-07-01: 13:00:00 via INTRAVENOUS
  Filled 2018-07-01: qty 250

## 2018-07-01 MED ORDER — SODIUM CHLORIDE 0.9 % IV SOLN
240.0000 mg | Freq: Once | INTRAVENOUS | Status: AC
  Administered 2018-07-01: 240 mg via INTRAVENOUS
  Filled 2018-07-01: qty 24

## 2018-07-01 MED ORDER — SODIUM CHLORIDE 0.9 % IJ SOLN
10.0000 mL | INTRAMUSCULAR | Status: DC | PRN
  Administered 2018-07-01: 10 mL
  Filled 2018-07-01: qty 10

## 2018-07-01 NOTE — Patient Instructions (Signed)
[facility] Discharge Instructions for Patients Receiving Chemotherapy  Today you received the following chemotherapy agents: nivolumab.  To help prevent nausea and vomiting after your treatment, we encourage you to take your nausea medication as directed.   If you develop nausea and vomiting that is not controlled by your nausea medication, call the clinic.   BELOW ARE SYMPTOMS THAT SHOULD BE REPORTED IMMEDIATELY:  *FEVER GREATER THAN 100.5 F  *CHILLS WITH OR WITHOUT FEVER  NAUSEA AND VOMITING THAT IS NOT CONTROLLED WITH YOUR NAUSEA MEDICATION  *UNUSUAL SHORTNESS OF BREATH  *UNUSUAL BRUISING OR BLEEDING  TENDERNESS IN MOUTH AND THROAT WITH OR WITHOUT PRESENCE OF ULCERS  *URINARY PROBLEMS  *BOWEL PROBLEMS  UNUSUAL RASH Items with * indicate a potential emergency and should be followed up as soon as possible.  Feel free to call the clinic should you have any questions or concerns. The clinic phone number is [phone].  Please show the CHEMO ALERT CARD at check-in to the Emergency Department and triage nurse.   

## 2018-07-25 ENCOUNTER — Telehealth: Payer: Self-pay

## 2018-07-25 NOTE — Telephone Encounter (Signed)
He called and left a message requesting refill on Synthroid and Oxycodone Rx's. Request they be sent to [facility].

## 2018-07-26 ENCOUNTER — Other Ambulatory Visit: Payer: Self-pay | Admitting: Hematology and Oncology

## 2018-07-26 MED ORDER — OXYCODONE HCL 15 MG PO TABS: 15.0000 mg | ORAL_TABLET | ORAL | 0 refills | Status: DC | PRN

## 2018-07-26 MED ORDER — LEVOTHYROXINE SODIUM 125 MCG PO TABS: 125.0000 ug | ORAL_TABLET | Freq: Every day | ORAL | 9 refills | Status: DC

## 2018-07-26 NOTE — Telephone Encounter (Signed)
done

## 2018-07-28 ENCOUNTER — Telehealth: Payer: Self-pay

## 2018-07-28 ENCOUNTER — Inpatient Hospital Stay: Payer: Medicaid Other | Attending: Hematology and Oncology | Admitting: Medical

## 2018-07-28 ENCOUNTER — Inpatient Hospital Stay: Payer: Medicaid Other | Admitting: Medical

## 2018-07-28 ENCOUNTER — Telehealth: Payer: Self-pay | Admitting: Medical

## 2018-07-28 VITALS — BP 123/82 | HR 72 | Temp 98.0°F | Resp 18 | Ht 68.75 in | Wt 183.4 lb

## 2018-07-28 DIAGNOSIS — L0211 Cutaneous abscess of neck: Secondary | ICD-10-CM | POA: Diagnosis not present

## 2018-07-28 DIAGNOSIS — F419 Anxiety disorder, unspecified: Secondary | ICD-10-CM | POA: Diagnosis not present

## 2018-07-28 DIAGNOSIS — C760 Malignant neoplasm of head, face and neck: Secondary | ICD-10-CM | POA: Diagnosis present

## 2018-07-28 DIAGNOSIS — R221 Localized swelling, mass and lump, neck: Secondary | ICD-10-CM | POA: Diagnosis not present

## 2018-07-28 DIAGNOSIS — M542 Cervicalgia: Secondary | ICD-10-CM | POA: Diagnosis not present

## 2018-07-28 DIAGNOSIS — Z5112 Encounter for antineoplastic immunotherapy: Secondary | ICD-10-CM | POA: Insufficient documentation

## 2018-07-28 DIAGNOSIS — F321 Major depressive disorder, single episode, moderate: Secondary | ICD-10-CM

## 2018-07-28 DIAGNOSIS — F5101 Primary insomnia: Secondary | ICD-10-CM | POA: Diagnosis not present

## 2018-07-28 DIAGNOSIS — G893 Neoplasm related pain (acute) (chronic): Secondary | ICD-10-CM

## 2018-07-28 DIAGNOSIS — Z923 Personal history of irradiation: Secondary | ICD-10-CM | POA: Diagnosis not present

## 2018-07-28 MED ORDER — DULOXETINE HCL 30 MG PO CPEP: 30.0000 mg | ORAL_CAPSULE | Freq: Two times a day (BID) | ORAL | 5 refills | Status: DC

## 2018-07-28 MED ORDER — LORAZEPAM 0.5 MG PO TABS: 0.5000 mg | ORAL_TABLET | Freq: Two times a day (BID) | ORAL | 0 refills | Status: DC | PRN

## 2018-07-28 NOTE — Patient Instructions (Signed)
Implanted Port Home Guide An implanted port is a type of central line that is placed under the skin. Central lines are used to provide IV access when treatment or nutrition needs to be given through a person's veins. Implanted ports are used for long-term IV access. An implanted port may be placed because:  You need IV medicine that would be irritating to the small veins in your hands or arms.  You need long-term IV medicines, such as antibiotics.  You need IV nutrition for a long period.  You need frequent blood draws for lab tests.  You need dialysis.  Implanted ports are usually placed in the chest area, but they can also be placed in the upper arm, the abdomen, or the leg. An implanted port has two main parts:  Reservoir. The reservoir is round and will appear as a small, raised area under your skin. The reservoir is the part where a needle is inserted to give medicines or draw blood.  Catheter. The catheter is a thin, flexible tube that extends from the reservoir. The catheter is placed into a large vein. Medicine that is inserted into the reservoir goes into the catheter and then into the vein.  How will I care for my incision site? Do not get the incision site wet. Bathe or shower as directed by your health care provider. How is my port accessed? Special steps must be taken to access the port:  Before the port is accessed, a numbing cream can be placed on the skin. This helps numb the skin over the port site.  Your health care provider uses a sterile technique to access the port. ? Your health care provider must put on a mask and sterile gloves. ? The skin over your port is cleaned carefully with an antiseptic and allowed to dry. ? The port is gently pinched between sterile gloves, and a needle is inserted into the port.  Only "non-coring" port needles should be used to access the port. Once the port is accessed, a blood return should be checked. This helps ensure that the port  is in the vein and is not clogged.  If your port needs to remain accessed for a constant infusion, a clear (transparent) bandage will be placed over the needle site. The bandage and needle will need to be changed every week, or as directed by your health care provider.  Keep the bandage covering the needle clean and dry. Do not get it wet. Follow your health care provider's instructions on how to take a shower or bath while the port is accessed.  If your port does not need to stay accessed, no bandage is needed over the port.  What is flushing? Flushing helps keep the port from getting clogged. Follow your health care provider's instructions on how and when to flush the port. Ports are usually flushed with saline solution or a medicine called heparin. The need for flushing will depend on how the port is used.  If the port is used for intermittent medicines or blood draws, the port will need to be flushed: ? After medicines have been given. ? After blood has been drawn. ? As part of routine maintenance.  If a constant infusion is running, the port may not need to be flushed.  How long will my port stay implanted? The port can stay in for as long as your health care provider thinks it is needed. When it is time for the port to come out, surgery will be   done to remove it. The procedure is similar to the one performed when the port was put in. When should I seek immediate medical care? When you have an implanted port, you should seek immediate medical care if:  You notice a bad smell coming from the incision site.  You have swelling, redness, or drainage at the incision site.  You have more swelling or pain at the port site or the surrounding area.  You have a fever that is not controlled with medicine.  This information is not intended to replace advice given to you by your health care provider. Make sure you discuss any questions you have with your health care provider. Document  Released: 09/21/2005 Document Revised: 02/27/2016 Document Reviewed: 05/29/2013 Elsevier Interactive Patient Education  2017 [employer]  

## 2018-07-28 NOTE — Telephone Encounter (Signed)
He called and left a message requesting appt. He is not feeling well.  Called back per Dr. Alvy Bimler. Offered appt with Sandi Mealy, PA. He is not feeling well and feels anxious. Appt scheduled with Sandi Mealy, PA.

## 2018-07-28 NOTE — Telephone Encounter (Signed)
Pt r/s per 10/24 sch message

## 2018-07-29 ENCOUNTER — Inpatient Hospital Stay: Payer: Medicaid Other

## 2018-07-29 ENCOUNTER — Ambulatory Visit (HOSPITAL_BASED_OUTPATIENT_CLINIC_OR_DEPARTMENT_OTHER): Payer: Medicaid Other | Admitting: Medical

## 2018-07-29 VITALS — BP 126/84 | HR 76 | Temp 99.1°F | Resp 16

## 2018-07-29 DIAGNOSIS — C760 Malignant neoplasm of head, face and neck: Secondary | ICD-10-CM

## 2018-07-29 DIAGNOSIS — E039 Hypothyroidism, unspecified: Secondary | ICD-10-CM

## 2018-07-29 DIAGNOSIS — C7800 Secondary malignant neoplasm of unspecified lung: Secondary | ICD-10-CM

## 2018-07-29 DIAGNOSIS — Z95828 Presence of other vascular implants and grafts: Secondary | ICD-10-CM

## 2018-07-29 DIAGNOSIS — Z5112 Encounter for antineoplastic immunotherapy: Secondary | ICD-10-CM | POA: Diagnosis not present

## 2018-07-29 LAB — COMPREHENSIVE METABOLIC PANEL
ALT: 39 U/L (ref 0–44)
AST: 34 U/L (ref 15–41)
Albumin: 4.1 g/dL (ref 3.5–5.0)
Alkaline Phosphatase: 71 U/L (ref 38–126)
Anion gap: 11 (ref 5–15)
BILIRUBIN TOTAL: 0.5 mg/dL (ref 0.3–1.2)
BUN: 13 mg/dL (ref 6–20)
CHLORIDE: 104 mmol/L (ref 98–111)
CO2: 27 mmol/L (ref 22–32)
Calcium: 9.2 mg/dL (ref 8.9–10.3)
Creatinine, Ser: 0.88 mg/dL (ref 0.61–1.24)
Glucose, Bld: 100 mg/dL — ABNORMAL HIGH (ref 70–99)
POTASSIUM: 3.9 mmol/L (ref 3.5–5.1)
Sodium: 142 mmol/L (ref 135–145)
Total Protein: 7.2 g/dL (ref 6.5–8.1)

## 2018-07-29 LAB — TSH: TSH: 2.979 u[IU]/mL (ref 0.320–4.118)

## 2018-07-29 LAB — CBC WITH DIFFERENTIAL/PLATELET
Abs Immature Granulocytes: 0.01 10*3/uL (ref 0.00–0.07)
Basophils Absolute: 0 10*3/uL (ref 0.0–0.1)
Basophils Relative: 1 %
EOS ABS: 0.1 10*3/uL (ref 0.0–0.5)
Eosinophils Relative: 2 %
HEMATOCRIT: 42.4 % (ref 39.0–52.0)
Hemoglobin: 14.2 g/dL (ref 13.0–17.0)
Immature Granulocytes: 0 %
LYMPHS ABS: 1.4 10*3/uL (ref 0.7–4.0)
Lymphocytes Relative: 26 %
MCH: 29.7 pg (ref 26.0–34.0)
MCHC: 33.5 g/dL (ref 30.0–36.0)
MCV: 88.7 fL (ref 80.0–100.0)
MONO ABS: 0.5 10*3/uL (ref 0.1–1.0)
MONOS PCT: 9 %
Neutro Abs: 3.5 10*3/uL (ref 1.7–7.7)
Neutrophils Relative %: 62 %
PLATELETS: 237 10*3/uL (ref 150–400)
RBC: 4.78 MIL/uL (ref 4.22–5.81)
RDW: 13.5 % (ref 11.5–15.5)
WBC: 5.6 10*3/uL (ref 4.0–10.5)
nRBC: 0 % (ref 0.0–0.2)

## 2018-07-29 MED ORDER — SODIUM CHLORIDE 0.9% FLUSH
10.0000 mL | Freq: Once | INTRAVENOUS | Status: AC
  Administered 2018-07-29: 10 mL
  Filled 2018-07-29: qty 10

## 2018-07-29 MED ORDER — ANTICOAGULANT SODIUM CITRATE 4% (200MG/5ML) IV SOLN
5.0000 mL | Freq: Once | Status: AC
  Administered 2018-07-29: 5 mL via INTRAVENOUS
  Filled 2018-07-29: qty 5

## 2018-07-29 MED ORDER — SODIUM CHLORIDE 0.9 % IV SOLN
Freq: Once | INTRAVENOUS | Status: AC
  Administered 2018-07-29: 13:00:00 via INTRAVENOUS
  Filled 2018-07-29: qty 250

## 2018-07-29 MED ORDER — SODIUM CHLORIDE 0.9 % IV SOLN
240.0000 mg | Freq: Once | INTRAVENOUS | Status: AC
  Administered 2018-07-29: 240 mg via INTRAVENOUS
  Filled 2018-07-29: qty 24

## 2018-07-29 MED ORDER — SODIUM CHLORIDE 0.9 % IJ SOLN
10.0000 mL | INTRAMUSCULAR | Status: DC | PRN
  Administered 2018-07-29: 10 mL
  Filled 2018-07-29: qty 10

## 2018-07-29 NOTE — Patient Instructions (Signed)
[facility] Discharge Instructions for Patients Receiving Chemotherapy  Today you received the following chemotherapy agents: Nivolumab  To help prevent nausea and vomiting after your treatment, we encourage you to take your nausea medication as directed.    If you develop nausea and vomiting that is not controlled by your nausea medication, call the clinic.   BELOW ARE SYMPTOMS THAT SHOULD BE REPORTED IMMEDIATELY:  *FEVER GREATER THAN 100.5 F  *CHILLS WITH OR WITHOUT FEVER  NAUSEA AND VOMITING THAT IS NOT CONTROLLED WITH YOUR NAUSEA MEDICATION  *UNUSUAL SHORTNESS OF BREATH  *UNUSUAL BRUISING OR BLEEDING  TENDERNESS IN MOUTH AND THROAT WITH OR WITHOUT PRESENCE OF ULCERS  *URINARY PROBLEMS  *BOWEL PROBLEMS  UNUSUAL RASH Items with * indicate a potential emergency and should be followed up as soon as possible.  Feel free to call the clinic should you have any questions or concerns. The clinic phone number is [phone].  Please show the CHEMO ALERT CARD at check-in to the Emergency Department and triage nurse.   

## 2018-08-01 NOTE — Progress Notes (Signed)
Symptoms Management Clinic Progress Note   Christian Simmons [id number] June 24, 1986  32 y.o.  [place]croft is managed by Dr. [name]  Actively treated with chemotherapy/immunotherapy: yes  Current Therapy: Keytruda  Last Treated: 07/29/2018 (cycle 47, day 1)  Assessment: Plan:    Anxiety - Plan: DULoxetine (CYMBALTA) 30 MG capsule, LORazepam (ATIVAN) 0.5 MG tablet  Neoplasm related pain - Plan: DULoxetine (CYMBALTA) 30 MG capsule  Primary insomnia - Plan: LORazepam (ATIVAN) 0.5 MG tablet  Current moderate episode of major depressive disorder without prior episode ([place]) - Plan: DULoxetine (CYMBALTA) 30 MG capsule  Primary cancer of head and neck (HCC)   Anxiety and depression: The patient was given a prescription for Cymbalta 30 mg p.o. twice daily.  He was told to begin at 30 mg once daily for the next 7 to 10 days then increase to 30 mg twice daily.  He was also given a prescription of Ativan 0. 5 mg with instructions to take this at bedtime.  Neoplasm related pain:  The patient was given a prescription for Cymbalta 30 mg p.o. twice daily.  He was told to begin at 30 mg once daily for the next 7 to 10 days then increase to 30 mg twice daily.  He would like to attempt to taper his oxycodone.  He has been told that he could attempt a taper after being on Cymbalta for 3 weeks.  Insomnia: The patient was given a prescription for Ativan 0.5 mg p.o. nightly as needed for insomnia.  Depression: he patient was given a prescription for Cymbalta 30 mg p.o. twice daily.  He was told to begin at 30 mg once daily for the next 7 to 10 days then increase to 30 mg twice daily.   Metastatic head neck cancer: The patient continues to be followed by Dr. [name] and is currently treated with nivolumab.  He is scheduled to see her in return on 09/23/2018.  Please see After Visit Summary for patient specific instructions.  Future Appointments  Date Time Provider [place]    08/26/2018 11:30 AM [facility] ONLY [facility] None  08/26/2018 11:45 AM [facility] [place] FLUSH [facility] None  08/26/2018 12:45 PM [facility] INFUSION [facility] None  09/23/2018 12:15 PM [facility] LAB 6 [facility] None  09/23/2018 12:30 PM [facility] [place] FLUSH [facility] None  09/23/2018  1:00 PM Gorsuch, Ni, MD [facility] None  09/23/2018  1:45 PM [facility] INFUSION [facility] None    No orders of the defined types were placed in this encounter.      Subjective:   Patient ID:  Christian Simmons is a 32 y.o. (DOB [birth year]) male.  Chief Complaint:  Chief Complaint  Patient presents with  . Fatigue    HPI Christian Simmons is a 32 year old male with a history of a metastatic head neck cancer who is managed by Dr. [name] and is currently treated with nivolumab.  He presents to the clinic today with report that he is having insomnia, significant anorexia, nausea, constipation, anxiety, difficulty focusing, generalized achiness, and swelling of his hands.   He reports that his recently increased the oxycodone that he is taking and would like to attempt to taper off of this medication.  He reports having right neck pain when he is exposed to cold temperatures.  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  He is also working as a Geophysicist/field seismologist for [employer] .  He has a 40 and a 86-year-old child.  He denies suicidal or  homicidal ideations.  Medications: I have reviewed the patient's current medications.  Allergies:  Allergies  Allergen Reactions  . Heparin Other (See Comments)    No Pork derivatives due to religion    Past Medical History:  Diagnosis Date  . Arrhythmia 12/25/2015  . Carcinoma ([place])   . Fatigue 06/01/2014  . Hypothyroidism   . Infection of eyelash follicle of left eye 3/57/0177  . 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 Neuropathy   . Radiation 07/18/14-07/31/14   Left upper lobe  40 gy in 10 fractions  . 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 LUNG REMOVAL, PARTIAL  05/03/2012   left upper lobectomy  . nasal biopsy    . RADICAL NECK DISSECTION    . VIDEO BRONCHOSCOPY N/A 06/29/2014   Procedure: VIDEO BRONCHOSCOPY ;  Surgeon: Melrose Nakayama, MD;  Location: [facility] OR;  Service: Thoracic;  Laterality: N/A;    Family History  Problem Relation Age of Onset  . Hypertension Mother   . Diabetes Mother   . Hypertension Father   . Diabetes Father     Social History   Socioeconomic History  . Marital status: Married    Spouse name: Not on file  . Number of children: 1  . Years of education: Not on file  . Highest education level: Not on file  Occupational History  . Not on file  Social Needs  . Financial resource strain: Not on file  . Food insecurity:    Worry: Not on file    Inability: Not on file  . Transportation needs:    Medical: Not on file    Non-medical: Not on file  Tobacco Use  . Smoking status: Never Smoker  . Smokeless tobacco: Never Used  Substance and Sexual Activity  . Alcohol use: No  . Drug use: No  . Sexual activity: Not on file  Lifestyle  . Physical activity:    Days per week: Not on file    Minutes per session: Not on file  . Stress: Not on file  Relationships  . Social connections:    Talks on phone: Not on file    Gets together: Not on file    Attends religious service: Not on file    Active member of club or organization: Not on file    Attends meetings of clubs or organizations: Not on file    Relationship status: Not on file  . Intimate partner violence:    Fear of current or ex partner: Not on file    Emotionally abused: Not on file    Physically abused: Not on file    Forced sexual activity: Not on file  Other Topics Concern  . Not on file  Social History Narrative   ** Merged History Encounter **        Past Medical History, Surgical history, Social history, and Family history were  reviewed and updated as appropriate.   Please see review of systems for further details on the patient's review from today.   Review of Systems:  Review of Systems  Constitutional: Positive for appetite change. Negative for chills, diaphoresis, fever and unexpected weight change.  HENT: Negative for trouble swallowing.   Respiratory: Negative for cough, chest tightness and shortness of breath.   Cardiovascular: Negative for chest pain and palpitations.  Gastrointestinal: Negative for abdominal pain, constipation, diarrhea, nausea and  vomiting.  Neurological: Negative for dizziness and headaches.  Psychiatric/Behavioral: Positive for dysphoric mood and sleep disturbance. The patient is nervous/anxious.     Objective:   Physical Exam:  BP 123/82 (BP Location: Left Arm, Patient Position: Sitting)   Pulse 72   Temp 98 F (36.7 C) (Oral)   Resp 18   Ht 5' 8.75" (1.746 m)   Wt 183 lb 6.4 oz (83.2 kg)   SpO2 100%   BMI 27.28 kg/m  ECOG: 0  Physical Exam  Constitutional: He is oriented to person, place, and time. He appears well-developed and well-nourished. No distress.  HENT:  Head: Normocephalic.  Eyes: Right eye exhibits no discharge. Left eye exhibits no discharge. No scleral icterus.  Neck:  A well-healed surgical incision is noted along the right lateral neck.  Neurological: He is alert and oriented to person, place, and time. Coordination normal.  Skin: He is not diaphoretic.  Psychiatric: He has a normal mood and affect. His behavior is normal. Judgment and thought content normal.    Lab Review:     Component Value Date/Time   NA 142 07/29/2018 1141   NA 139 09/22/2017 0829   K 3.9 07/29/2018 1141   K 3.5 09/22/2017 0829   CL 104 07/29/2018 1141   CO2 27 07/29/2018 1141   CO2 26 09/22/2017 0829   GLUCOSE 100 (H) 07/29/2018 1141   GLUCOSE 133 09/22/2017 0829   BUN 13 07/29/2018 1141   BUN 14.1 09/22/2017 0829   CREATININE 0.88 07/29/2018 1141   CREATININE 0.9  09/22/2017 0829   CALCIUM 9.2 07/29/2018 1141   CALCIUM 9.1 09/22/2017 0829   PROT 7.2 07/29/2018 1141   PROT 6.8 09/22/2017 0829   ALBUMIN 4.1 07/29/2018 1141   ALBUMIN 4.1 09/22/2017 0829   AST 34 07/29/2018 1141   AST 22 09/22/2017 0829   ALT 39 07/29/2018 1141   ALT 30 09/22/2017 0829   ALKPHOS 71 07/29/2018 1141   ALKPHOS 55 09/22/2017 0829   BILITOT 0.5 07/29/2018 1141   BILITOT 0.35 09/22/2017 0829   GFRNONAA >60 07/29/2018 1141   GFRAA >60 07/29/2018 1141       Component Value Date/Time   WBC 5.6 07/29/2018 1141   RBC 4.78 07/29/2018 1141   HGB 14.2 07/29/2018 1141   HGB 14.1 09/22/2017 0829   HCT 42.4 07/29/2018 1141   HCT 41.2 09/22/2017 0829   PLT 237 07/29/2018 1141   PLT 176 09/22/2017 0829   MCV 88.7 07/29/2018 1141   MCV 90.2 09/22/2017 0829   MCH 29.7 07/29/2018 1141   MCHC 33.5 07/29/2018 1141   RDW 13.5 07/29/2018 1141   RDW 13.5 09/22/2017 0829   LYMPHSABS 1.4 07/29/2018 1141   LYMPHSABS 1.4 09/22/2017 0829   MONOABS 0.5 07/29/2018 1141   MONOABS 0.6 09/22/2017 0829   EOSABS 0.1 07/29/2018 1141   EOSABS 0.1 09/22/2017 0829   BASOSABS 0.0 07/29/2018 1141   BASOSABS 0.0 09/22/2017 0829   -------------------------------  Imaging from last 24 hours (if applicable):  Radiology interpretation: No results found.      This case was discussed with Dr. Alvy Bimler. She expresses agreement with my management of this patient.

## 2018-08-02 ENCOUNTER — Telehealth: Payer: Self-pay

## 2018-08-02 NOTE — Telephone Encounter (Signed)
He called and requested appt. He is having right sided swelling and discoloration more than normal on the right side.

## 2018-08-02 NOTE — Telephone Encounter (Signed)
Above message given to Dr. Alvy Bimler. Called back and given appt with Sandi Mealy, PA for tomorrow at 1215. Instructed per Dr. Alvy Bimler to wear scarf around neck. He verbalized understanding.

## 2018-08-03 ENCOUNTER — Inpatient Hospital Stay (HOSPITAL_BASED_OUTPATIENT_CLINIC_OR_DEPARTMENT_OTHER): Payer: Medicaid Other | Admitting: Medical

## 2018-08-03 VITALS — BP 112/96 | HR 60 | Temp 98.6°F | Resp 18

## 2018-08-03 DIAGNOSIS — M542 Cervicalgia: Secondary | ICD-10-CM | POA: Diagnosis not present

## 2018-08-03 DIAGNOSIS — L0211 Cutaneous abscess of neck: Secondary | ICD-10-CM | POA: Diagnosis not present

## 2018-08-03 DIAGNOSIS — R221 Localized swelling, mass and lump, neck: Secondary | ICD-10-CM | POA: Diagnosis not present

## 2018-08-03 DIAGNOSIS — C760 Malignant neoplasm of head, face and neck: Secondary | ICD-10-CM

## 2018-08-03 DIAGNOSIS — Z5112 Encounter for antineoplastic immunotherapy: Secondary | ICD-10-CM | POA: Diagnosis not present

## 2018-08-03 MED ORDER — CEPHALEXIN 500 MG PO CAPS: 500.0000 mg | ORAL_CAPSULE | Freq: Four times a day (QID) | ORAL | 0 refills | Status: DC

## 2018-08-03 NOTE — Patient Instructions (Signed)
Skin Abscess A skin abscess is an infected area on or under your skin that contains pus and other material. An abscess can happen almost anywhere on your body. Some abscesses break open (rupture) on their own. Most continue to get worse unless they are treated. The infection can spread deeper into the body and into your blood, which can make you feel sick. Treatment usually involves draining the abscess. Follow these instructions at home: Abscess Care  If you have an abscess that has not drained, place a warm, clean, wet washcloth over the abscess several times a day. Do this as told by your doctor.  Follow instructions from your doctor about how to take care of your abscess. Make sure you: ? Cover the abscess with a bandage (dressing). ? Change your bandage or gauze as told by your doctor. ? Wash your hands with soap and water before you change the bandage or gauze. If you cannot use soap and water, use hand sanitizer.  Check your abscess every day for signs that the infection is getting worse. Check for: ? More redness, swelling, or pain. ? More fluid or blood. ? Warmth. ? More pus or a bad smell. Medicines   Take over-the-counter and prescription medicines only as told by your doctor.  If you were prescribed an antibiotic medicine, take it as told by your doctor. Do not stop taking the antibiotic even if you start to feel better. General instructions  To avoid spreading the infection: ? Do not share personal care items, towels, or hot tubs with others. ? Avoid making skin-to-skin contact with other people.  Keep all follow-up visits as told by your doctor. This is important. Contact a doctor if:  You have more redness, swelling, or pain around your abscess.  You have more fluid or blood coming from your abscess.  Your abscess feels warm when you touch it.  You have more pus or a bad smell coming from your abscess.  You have a fever.  Your muscles ache.  You have  chills.  You feel sick. Get help right away if:  You have very bad (severe) pain.  You see red streaks on your skin spreading away from the abscess. This information is not intended to replace advice given to you by your health care provider. Make sure you discuss any questions you have with your health care provider. Document Released: 03/09/2008 Document Revised: 05/17/2016 Document Reviewed: 07/31/2015 Elsevier Interactive Patient Education  2018 Elsevier Inc.  

## 2018-08-03 NOTE — Progress Notes (Signed)
Pt presents with swelling, redness, and bruising around large tender mass in upper side of R side of neck.  Area is hardened and dark.  Pt denies trouble swallowing or breathing.  No recent injury.

## 2018-08-04 NOTE — Progress Notes (Signed)
Symptoms Management Clinic Progress Note   Christian Simmons [id number] 07-19-1986 32 y.o.  Christian Simmons is managed by Dr. [name]  Actively treated with chemotherapy/immunotherapy: yes  Current Therapy: Keytruda  Last Treated: 07/29/2018 (cycle 47, day 1)  Assessment: Plan:    Neck swelling - Plan: cephALEXin (KEFLEX) 500 MG capsule, Ambulatory referral to ENT  Acute neck pain - Plan: cephALEXin (KEFLEX) 500 MG capsule, Ambulatory referral to ENT  Abscess of neck - Plan: cephALEXin (KEFLEX) 500 MG capsule, Ambulatory referral to ENT  Primary cancer of head and neck ([place]) - Plan: Ambulatory referral to ENT   Swelling of the right neck with acute right neck pain and a suspected abscess: The patient was given a prescription for Keflex 500 mg p.o. 4 times daily x7 days.  A referral is being made to otolaryngology to see the patient for evaluation of possible I&D of the suspected abscess.  Metastatic head neck cancer: The patient continues to be followed by Dr. [name] and is treated with [facility] with his last cycle dosed on 07/29/2018.   His last PET scan from 05/11/2018 returned showing the following:  1. Continued chronic accentuated metabolic activity in the vicinity of right level IIB and the adjacent right sternocleidomastoid muscle, with ill definition of surrounding tissue planes. Maximum SUV is currently 8.1, formerly 9.5. Accentuated metabolic activity is been present in this vicinity back through 06/25/2015, and there was also some low-level activity in this vicinity on 06/07/2014. Some of this may be from scarring and local muscular activity although clearly a component of residual tumor is difficult to exclude given the focally high activity.  2. Other imaging findings of potential clinical significance: Chronic bilateral maxillary sinusitis. Chronic scarring in the left upper lobe. Chronically stable 5 mm left lower lobe nodule is considered benign. Nonobstructive  left nephrolithiasis.  The patient is scheduled to follow-up with Dr. [name] on 09/23/2018.  Please see After Visit Summary for patient specific instructions.  Future Appointments  Date Time Provider [place]  08/26/2018 11:30 AM [facility] LAB ONLY [facility] None  08/26/2018 11:45 AM [facility] [place] FLUSH [facility] None  08/26/2018 12:45 PM [facility] INFUSION [facility] None  09/23/2018 12:15 PM [facility] LAB 6 [facility] None  09/23/2018 12:30 PM [facility] [place] FLUSH [facility] None  09/23/2018  1:00 PM Alvy Bimler, Ni, MD [facility] None  09/23/2018  1:45 PM [facility] INFUSION [facility] None    Orders Placed This Encounter  Procedures  . Ambulatory referral to ENT       Subjective:   Patient ID:  Christian Simmons is a 32 y.o. (DOB 07-11-1986) male.  Chief Complaint:  Chief Complaint  Patient presents with  . Edema    Neck    HPI Christian Simmons is a 32 year old male with a history of a metastatic head neck cancer who is managed by Dr. [name] and is currently treated with nivolumab.  He presents to the clinic today having last been seen on 07/28/2018 for anxiety. He reports that his anxiety is better. He is sleeping better since taking Ativan at bedtime.  He presents to the office today stating that he noted swelling in his right lateral neck over the past several days.  The area is tender.  He has not had any drainage at the site.  He denies fevers, chills, or sweats.  He has not been seen by otolaryngology for a number of years and was last seen by a physician at [facility].  Medications: I  have reviewed the patient's current medications.  Allergies:  Allergies  Allergen Reactions  . Heparin Other (See Comments)    No Pork derivatives due to religion    Past Medical History:  Diagnosis Date  . Arrhythmia 12/25/2015  . Carcinoma ([place])   . Fatigue 06/01/2014  . Hypothyroidism   . Infection of eyelash follicle of left eye  1/54/0086  . 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 Neuropathy   . Radiation 07/18/14-07/31/14   Left upper lobe  40 gy in 10 fractions  . 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 LUNG REMOVAL, PARTIAL  05/03/2012   left upper lobectomy  . nasal biopsy    . RADICAL NECK DISSECTION    . VIDEO BRONCHOSCOPY N/A 06/29/2014   Procedure: VIDEO BRONCHOSCOPY ;  Surgeon: Melrose Nakayama, MD;  Location: [facility] OR;  Service: Thoracic;  Laterality: N/A;    Family History  Problem Relation Age of Onset  . Hypertension Mother   . Diabetes Mother   . Hypertension Father   . Diabetes Father     Social History   Socioeconomic History  . Marital status: Married    Spouse name: Not on file  . Number of children: 1  . Years of education: Not on file  . Highest education level: Not on file  Occupational History  . Not on file  Social Needs  . Financial resource strain: Not on file  . Food insecurity:    Worry: Not on file    Inability: Not on file  . Transportation needs:    Medical: Not on file    Non-medical: Not on file  Tobacco Use  . Smoking status: Never Smoker  . Smokeless tobacco: Never Used  Substance and Sexual Activity  . Alcohol use: No  . Drug use: No  . Sexual activity: Not on file  Lifestyle  . Physical activity:    Days per week: Not on file    Minutes per session: Not on file  . Stress: Not on file  Relationships  . Social connections:    Talks on phone: Not on file    Gets together: Not on file    Attends religious service: Not on file    Active member of club or organization: Not on file    Attends meetings of clubs or organizations: Not on file    Relationship status: Not on file  . Intimate partner violence:    Fear of current or ex partner: Not on file    Emotionally abused: Not on file    Physically abused: Not on file    Forced sexual activity: Not on  file  Other Topics Concern  . Not on file  Social History Narrative   ** Merged History Encounter **        Past Medical History, Surgical history, Social history, and Family history were reviewed and updated as appropriate.   Please see review of systems for further details on the patient's review from today.   Review of Systems:  Review of Systems  Constitutional: Negative for chills, diaphoresis, fever and unexpected weight change.  HENT: Negative for trouble swallowing.        Right neck swelling  Musculoskeletal: Positive for neck stiffness.       Right neck swelling and tenderness.    Objective:   Physical Exam:  BP (!) 112/96 (BP  Location: Right Arm, Patient Position: Sitting)   Pulse 60   Temp 98.6 F (37 C) (Oral)   Resp 18   SpO2 100%  ECOG: 0  Physical Exam  Constitutional: No distress.  HENT:  Head: Normocephalic and atraumatic.  Neck: Normal range of motion.  The right neck shows a surgical incision which is well-healed with hyperpigmentation consistent with a radiation field.  There is an area of diffuse hyperpigmentation and tenderness with an area of fluctuance consistent with a developing abscess.  Neurological: He is alert. Coordination normal.  Skin: Skin is warm and dry. He is not diaphoretic.  Psychiatric: He has a normal mood and affect. His behavior is normal. Judgment and thought content normal.    Lab Review:     Component Value Date/Time   NA 142 07/29/2018 1141   NA 139 09/22/2017 0829   K 3.9 07/29/2018 1141   K 3.5 09/22/2017 0829   CL 104 07/29/2018 1141   CO2 27 07/29/2018 1141   CO2 26 09/22/2017 0829   GLUCOSE 100 (H) 07/29/2018 1141   GLUCOSE 133 09/22/2017 0829   BUN 13 07/29/2018 1141   BUN 14.1 09/22/2017 0829   CREATININE 0.88 07/29/2018 1141   CREATININE 0.9 09/22/2017 0829   CALCIUM 9.2 07/29/2018 1141   CALCIUM 9.1 09/22/2017 0829   PROT 7.2 07/29/2018 1141   PROT 6.8 09/22/2017 0829   ALBUMIN 4.1 07/29/2018 1141     ALBUMIN 4.1 09/22/2017 0829   AST 34 07/29/2018 1141   AST 22 09/22/2017 0829   ALT 39 07/29/2018 1141   ALT 30 09/22/2017 0829   ALKPHOS 71 07/29/2018 1141   ALKPHOS 55 09/22/2017 0829   BILITOT 0.5 07/29/2018 1141   BILITOT 0.35 09/22/2017 0829   GFRNONAA >60 07/29/2018 1141   GFRAA >60 07/29/2018 1141       Component Value Date/Time   WBC 5.6 07/29/2018 1141   RBC 4.78 07/29/2018 1141   HGB 14.2 07/29/2018 1141   HGB 14.1 09/22/2017 0829   HCT 42.4 07/29/2018 1141   HCT 41.2 09/22/2017 0829   PLT 237 07/29/2018 1141   PLT 176 09/22/2017 0829   MCV 88.7 07/29/2018 1141   MCV 90.2 09/22/2017 0829   MCH 29.7 07/29/2018 1141   MCHC 33.5 07/29/2018 1141   RDW 13.5 07/29/2018 1141   RDW 13.5 09/22/2017 0829   LYMPHSABS 1.4 07/29/2018 1141   LYMPHSABS 1.4 09/22/2017 0829   MONOABS 0.5 07/29/2018 1141   MONOABS 0.6 09/22/2017 0829   EOSABS 0.1 07/29/2018 1141   EOSABS 0.1 09/22/2017 0829   BASOSABS 0.0 07/29/2018 1141   BASOSABS 0.0 09/22/2017 0829   -------------------------------  Imaging from last 24 hours (if applicable):  Radiology interpretation: No results found.

## 2018-08-05 NOTE — Progress Notes (Signed)
The patient asked to be seen while he was in the infusion room receiving treatment.  He was concerned that he was having swelling in his right neck after beginning Cymbalta and Ativan earlier this week.  He appeared stable without any new acute findings.  He was reassured to continue on Cymbalta and Ativan.  Sandi Mealy, MHS, PA-C Physician Assistant

## 2018-08-12 DIAGNOSIS — L0211 Cutaneous abscess of neck: Secondary | ICD-10-CM | POA: Insufficient documentation

## 2018-08-19 ENCOUNTER — Telehealth: Payer: Self-pay

## 2018-08-19 ENCOUNTER — Other Ambulatory Visit: Payer: Self-pay

## 2018-08-19 ENCOUNTER — Other Ambulatory Visit: Payer: Self-pay | Admitting: Hematology and Oncology

## 2018-08-19 MED ORDER — LEVOTHYROXINE SODIUM 125 MCG PO TABS: 125.0000 ug | ORAL_TABLET | Freq: Every day | ORAL | 9 refills | Status: DC

## 2018-08-19 MED ORDER — OXYCODONE HCL 15 MG PO TABS: 15.0000 mg | ORAL_TABLET | ORAL | 0 refills | Status: DC | PRN

## 2018-08-19 MED ORDER — DICLOFENAC SODIUM 50 MG PO TBEC: DELAYED_RELEASE_TABLET | ORAL | 0 refills | Status: DC

## 2018-08-19 NOTE — Telephone Encounter (Signed)
Patient called to ask for a refill on his Oxycodone.  An in basket was sent to Dr. Lindi Adie to ask if he would be able to refill this for him.  A call back to the patient was made but there was no answer and no voicemail was available.

## 2018-08-25 ENCOUNTER — Telehealth: Payer: Self-pay | Admitting: *Deleted

## 2018-08-25 NOTE — Telephone Encounter (Signed)
Pt states he has to cancel his chemo because he has an itchy rash all over his body. He was started on antibiotics last week- has seen PCP. Will need to be rescheduled

## 2018-08-26 ENCOUNTER — Inpatient Hospital Stay: Payer: Medicaid Other

## 2018-08-26 ENCOUNTER — Inpatient Hospital Stay: Payer: Medicaid Other | Attending: Hematology and Oncology

## 2018-08-26 ENCOUNTER — Other Ambulatory Visit: Payer: Self-pay | Admitting: Medical

## 2018-08-26 ENCOUNTER — Inpatient Hospital Stay (HOSPITAL_BASED_OUTPATIENT_CLINIC_OR_DEPARTMENT_OTHER): Payer: Medicaid Other | Admitting: Medical

## 2018-08-26 VITALS — BP 133/74 | HR 71 | Temp 98.0°F | Resp 18

## 2018-08-26 DIAGNOSIS — C7989 Secondary malignant neoplasm of other specified sites: Secondary | ICD-10-CM | POA: Diagnosis not present

## 2018-08-26 DIAGNOSIS — C7800 Secondary malignant neoplasm of unspecified lung: Secondary | ICD-10-CM

## 2018-08-26 DIAGNOSIS — L0291 Cutaneous abscess, unspecified: Secondary | ICD-10-CM

## 2018-08-26 DIAGNOSIS — C7802 Secondary malignant neoplasm of left lung: Secondary | ICD-10-CM | POA: Insufficient documentation

## 2018-08-26 DIAGNOSIS — Z5112 Encounter for antineoplastic immunotherapy: Secondary | ICD-10-CM | POA: Diagnosis present

## 2018-08-26 DIAGNOSIS — E039 Hypothyroidism, unspecified: Secondary | ICD-10-CM

## 2018-08-26 DIAGNOSIS — C760 Malignant neoplasm of head, face and neck: Secondary | ICD-10-CM | POA: Insufficient documentation

## 2018-08-26 DIAGNOSIS — Z95828 Presence of other vascular implants and grafts: Secondary | ICD-10-CM

## 2018-08-26 LAB — CBC WITH DIFFERENTIAL/PLATELET
ABS IMMATURE GRANULOCYTES: 0.1 10*3/uL — AB (ref 0.00–0.07)
BASOS ABS: 0 10*3/uL (ref 0.0–0.1)
BASOS PCT: 0 %
Eosinophils Absolute: 0 10*3/uL (ref 0.0–0.5)
Eosinophils Relative: 1 %
HCT: 41.5 % (ref 39.0–52.0)
HEMOGLOBIN: 13.7 g/dL (ref 13.0–17.0)
Immature Granulocytes: 1 %
LYMPHS PCT: 14 %
Lymphs Abs: 1.1 10*3/uL (ref 0.7–4.0)
MCH: 29.4 pg (ref 26.0–34.0)
MCHC: 33 g/dL (ref 30.0–36.0)
MCV: 89.1 fL (ref 80.0–100.0)
MONO ABS: 0.7 10*3/uL (ref 0.1–1.0)
Monocytes Relative: 9 %
NEUTROS ABS: 5.9 10*3/uL (ref 1.7–7.7)
Neutrophils Relative %: 75 %
PLATELETS: 223 10*3/uL (ref 150–400)
RBC: 4.66 MIL/uL (ref 4.22–5.81)
RDW: 13.6 % (ref 11.5–15.5)
WBC: 7.9 10*3/uL (ref 4.0–10.5)
nRBC: 0 % (ref 0.0–0.2)

## 2018-08-26 LAB — COMPREHENSIVE METABOLIC PANEL
ALT: 63 U/L — ABNORMAL HIGH (ref 0–44)
AST: 24 U/L (ref 15–41)
Albumin: 4.1 g/dL (ref 3.5–5.0)
Alkaline Phosphatase: 65 U/L (ref 38–126)
Anion gap: 9 (ref 5–15)
BUN: 15 mg/dL (ref 6–20)
CO2: 27 mmol/L (ref 22–32)
Calcium: 9.5 mg/dL (ref 8.9–10.3)
Chloride: 103 mmol/L (ref 98–111)
Creatinine, Ser: 0.87 mg/dL (ref 0.61–1.24)
GFR calc Af Amer: >60 mL/min (ref >60)
GFR calc non Af Amer: >60 mL/min (ref >60)
Glucose, Bld: 152 mg/dL — ABNORMAL HIGH (ref 70–99)
Potassium: 3.6 mmol/L (ref 3.5–5.1)
Sodium: 139 mmol/L (ref 135–145)
Total Bilirubin: 0.3 mg/dL (ref 0.3–1.2)
Total Protein: 7.1 g/dL (ref 6.5–8.1)

## 2018-08-26 MED ORDER — MUPIROCIN CALCIUM 2 % EX CREA: TOPICAL_CREAM | CUTANEOUS | 1 refills | Status: DC

## 2018-08-26 MED ORDER — SODIUM CHLORIDE 0.9% FLUSH
10.0000 mL | Freq: Once | INTRAVENOUS | Status: AC
  Administered 2018-08-26: 10 mL via INTRAVENOUS
  Filled 2018-08-26: qty 10

## 2018-08-26 MED ORDER — SODIUM CHLORIDE 0.9 % IV SOLN
Freq: Once | INTRAVENOUS | Status: AC
  Administered 2018-08-26: 13:00:00 via INTRAVENOUS
  Filled 2018-08-26: qty 250

## 2018-08-26 MED ORDER — ANTICOAGULANT SODIUM CITRATE 4% (200MG/5ML) IV SOLN
5.0000 mL | Freq: Once | Status: AC
  Administered 2018-08-26: 5 mL via INTRAVENOUS
  Filled 2018-08-26: qty 5

## 2018-08-26 MED ORDER — CETIRIZINE HCL 10 MG PO TABS: 10.0000 mg | ORAL_TABLET | Freq: Every day | ORAL | 2 refills | Status: DC

## 2018-08-26 MED ORDER — SODIUM CHLORIDE 0.9 % IJ SOLN
10.0000 mL | INTRAMUSCULAR | Status: DC | PRN
  Filled 2018-08-26: qty 10

## 2018-08-26 MED ORDER — SODIUM CHLORIDE 0.9% FLUSH
10.0000 mL | Freq: Once | INTRAVENOUS | Status: AC
  Administered 2018-08-26: 10 mL
  Filled 2018-08-26: qty 10

## 2018-08-26 MED ORDER — SODIUM CHLORIDE 0.9 % IV SOLN
240.0000 mg | Freq: Once | INTRAVENOUS | Status: AC
  Administered 2018-08-26: 240 mg via INTRAVENOUS
  Filled 2018-08-26: qty 24

## 2018-08-26 MED ORDER — FEXOFENADINE HCL 60 MG PO TABS: 60.0000 mg | ORAL_TABLET | Freq: Two times a day (BID) | ORAL | 2 refills | Status: DC

## 2018-08-26 NOTE — Patient Instructions (Signed)
[facility] Discharge Instructions for Patients Receiving Chemotherapy  Today you received the following chemotherapy agents: nivolumab.  To help prevent nausea and vomiting after your treatment, we encourage you to take your nausea medication as directed.   If you develop nausea and vomiting that is not controlled by your nausea medication, call the clinic.   BELOW ARE SYMPTOMS THAT SHOULD BE REPORTED IMMEDIATELY:  *FEVER GREATER THAN 100.5 F  *CHILLS WITH OR WITHOUT FEVER  NAUSEA AND VOMITING THAT IS NOT CONTROLLED WITH YOUR NAUSEA MEDICATION  *UNUSUAL SHORTNESS OF BREATH  *UNUSUAL BRUISING OR BLEEDING  TENDERNESS IN MOUTH AND THROAT WITH OR WITHOUT PRESENCE OF ULCERS  *URINARY PROBLEMS  *BOWEL PROBLEMS  UNUSUAL RASH Items with * indicate a potential emergency and should be followed up as soon as possible.  Feel free to call the clinic should you have any questions or concerns. The clinic phone number is [phone].  Please show the CHEMO ALERT CARD at check-in to the Emergency Department and triage nurse.   

## 2018-08-26 NOTE — Progress Notes (Signed)
Pt reports rash since Monday after completing a course of ABX for absess on R neck. V Tanner PA called  Bedside to assess pt.

## 2018-08-29 LAB — TSH: TSH: 1.35 u[IU]/mL (ref 0.320–4.118)

## 2018-08-30 ENCOUNTER — Telehealth: Payer: Self-pay | Admitting: Hematology and Oncology

## 2018-08-30 NOTE — Telephone Encounter (Signed)
Scheduled appt per 11/22 sch message - left message for patient with appt date and time

## 2018-08-31 NOTE — Progress Notes (Signed)
The patient was seen in the infusion room.  He had recently been treated for a suspected abscess of the right neck.  He was given Keflex.  He followed up with the head neck surgeon he was recently seen by Dr. Rayfield Citizen who was his head neck surgeon.  He reported that Dr. Janace Hoard is unsure why he was seeing the patient.  He stated that Dr. Janace Hoard questioned whether he was seeing him for a biopsy he reports that Dr. Janace Hoard was seen him for a biopsy.  I discussed with the patient that I had referred him to Dr. Janace Hoard for evaluation of a possible abscess of his right neck and to see whether the area need to have an incision and drainage completed.  He states that Dr. Janace Hoard gave him a prescription for clindamycin.  He had initially taken approximately a week of clindamycin and then had his prescription extended.  He reports that he then developed swelling of his hands and an erythematous and pruritic rash of his bilateral upper extremities.  He stopped his clindamycin.  He reports that he is supposed to follow-up with Dr. Janace Hoard possibly in early December.  He is not completely sure however.  He was given a prescription for Bactroban with instructions to use it up to 4 times daily.  He was also told to begin Allegra 60 mg p.o. twice daily and Zyrtec 10 mg p.o. once daily.  Sandi Mealy, MHS, PA-C Physician Assistant

## 2018-09-06 ENCOUNTER — Telehealth: Payer: Self-pay

## 2018-09-06 NOTE — Telephone Encounter (Signed)
He called and left a message. He needs to change appt on 12/5 at 1030 to after 1 pm. He has final exams on that day and tried to move exam.

## 2018-09-06 NOTE — Telephone Encounter (Signed)
I can see him at 145 pm or some other day

## 2018-09-06 NOTE — Telephone Encounter (Signed)
Called back and given below message. He verbalized understanding. He will take the 1:45 appt for 12/5. Appt scheduled.

## 2018-09-08 ENCOUNTER — Ambulatory Visit: Payer: Medicaid Other | Admitting: Hematology and Oncology

## 2018-09-08 ENCOUNTER — Inpatient Hospital Stay: Payer: Medicaid Other | Attending: Hematology and Oncology | Admitting: Hematology and Oncology

## 2018-09-08 ENCOUNTER — Encounter: Payer: Self-pay | Admitting: Hematology and Oncology

## 2018-09-08 ENCOUNTER — Telehealth: Payer: Self-pay

## 2018-09-08 DIAGNOSIS — C760 Malignant neoplasm of head, face and neck: Secondary | ICD-10-CM

## 2018-09-08 DIAGNOSIS — Z923 Personal history of irradiation: Secondary | ICD-10-CM | POA: Insufficient documentation

## 2018-09-08 DIAGNOSIS — Z79899 Other long term (current) drug therapy: Secondary | ICD-10-CM | POA: Diagnosis not present

## 2018-09-08 DIAGNOSIS — Z9221 Personal history of antineoplastic chemotherapy: Secondary | ICD-10-CM | POA: Insufficient documentation

## 2018-09-08 DIAGNOSIS — C7800 Secondary malignant neoplasm of unspecified lung: Secondary | ICD-10-CM

## 2018-09-08 DIAGNOSIS — G893 Neoplasm related pain (acute) (chronic): Secondary | ICD-10-CM | POA: Diagnosis not present

## 2018-09-08 MED ORDER — OXYCODONE HCL 15 MG PO TABS: 15.0000 mg | ORAL_TABLET | ORAL | 0 refills | Status: DC | PRN

## 2018-09-08 NOTE — Telephone Encounter (Signed)
-----   Message from [name], MD sent at 09/08/2018  3:17 PM EST ----- Regarding: Dr. Janace Hoard ENT Can you help me get hold of him to discuss this case? Thanks

## 2018-09-08 NOTE — Assessment & Plan Note (Signed)
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

## 2018-09-08 NOTE — Telephone Encounter (Signed)
Called and left a message on voicemail of Dr. Janace Hoard assistant to call Dr. Alvy Bimler back.

## 2018-09-08 NOTE — Assessment & Plan Note (Signed)
He is not symptomatic.  No cough.  Last imaging studies showed no evidence of disease progression

## 2018-09-08 NOTE — Progress Notes (Signed)
[place] OFFICE PROGRESS NOTE  Patient Care Team: Patient, No Pcp Per as PCP - General (General Practice) [name], MD as Consulting Physician (Hematology and Oncology) Eppie Gibson, MD as Attending Physician (Radiation Oncology) Jodi Marble, MD as Consulting Physician (Otolaryngology) Philomena Doheny, MD as Referring Physician (Plastic Surgery) Irene Shipper, MD as Consulting Physician (Gastroenterology) System, Provider Not In  ASSESSMENT & PLAN:  Primary cancer of head and neck [facility]) While I was away, he was being evaluated due to new lesion on his right neck and was treated with recurrent treatment of antibiotics for possible abscess The abnormal appearance of the lesion is concerning for 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  No cough.  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  I refilled his prescriptions today   No orders of the defined types were placed in this encounter.   INTERVAL HISTORY: Please see below for problem oriented charting. He is seen for evaluation due to new right neck mass.  He was evaluated by my physician assistant for similar issue and was prescribed antibiotic therapy for possible abscess.  He has not improved.  He denies fever or chills.  He also noted increasing neck pain. 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 Pan-endoscopy elsewhere showed cancer from nasopharyngeal space.    01/04/2007 - 02/20/2007 Chemotherapy    He received 2 cycles of cisplatin and 5FU followed by concurrent chemo with weekly cisplatin and radiation. He only received 2 doses of chemo due to severe mucositis, nausea and weight loss.    04/05/2007 - 08/04/2007 Chemotherapy    He received 4 more courses of cisplatin with 5FU and had complete response    07/05/2009 Procedure    Fine-needle aspirate of the right level II lymph nodes come from recurrent metastatic disease. Repeat endoscopy and CT scan show no evidence of disease elsewhere.    07/08/2009 - 12/02/2009 Chemotherapy    He was given 6 cycles of carboplatin, 5-FU and docetaxel    12/03/2009 Surgery    He has surgery to the residual lymph node on the right neck which showed no evidence of disease.    02/22/2012 Imaging    Repeat imaging study showed large recurrent mass. He was referred elsewhere for further treatment.    05/03/2012 Surgery    He underwent left upper lobectomy.    04/29/2013 Imaging    PEt scan showed lesion on right level II B and lower lung was abnormal    06/03/2013 - 02/02/2014 Chemotherapy    He had 6 cycles of chemotherapy when he was found to have recurrence  of cancer and had received oxaliplatin and capecitabine    06/07/2014 Imaging    PET CT scan showed persistent disease in the right neck lymph nodes and left lung    06/29/2014 Procedure    Accession: [id number] repeat LUL biopsy confirmed metastatic cancer    07/18/2014 - 07/31/2014 Radiation Therapy    He received palliative radiation therapy to the lungs    10/10/2014 Imaging    CT scan of the chest, abdomen and pelvis show regression in the size of the lung nodule in the left upper lobe and  stable pulmonary nodules    01/24/2015 Imaging    CT scan showed stable disease in neck and lung    06/19/2015 Imaging    CT scan of the neck and the chest show possible mild progression of the nodule in the right side of the neck.    06/25/2015 Imaging    PET scan confirmed disease recurrence in the neck    07/07/2015 Imaging    He had MRI neck at [facility]    09/03/2015 -  Chemotherapy    He received palliative chemo with Nivolumab    10/29/2015 Imaging    PET CT showed positive response to Rx    02/28/2016 Imaging    Ct abdomen showed abnormal thinkening in his stomach    03/03/2016 Imaging    CT: Right sternocleidomastoid muscle metastasis appears less distinct but otherwise not significantly changed in size or configuration since 06/19/2015.2. Left level 3 lymph node which was hypermetabolic by PET-CT in January 2017 appears slightly smaller    04/01/2016 Imaging    CT cervical spine showed no acute fracture or traumatic malalignment in the cervical spine    04/22/2016 Procedure    Port-a-cath placed.    06/16/2016 Imaging    Ct neck showed right sternocleidomastoid muscle metastasis is further decreased in conspicuity since May, and has mildly decreased in size since September 2016. Continued stability of sub-centimeter left cervical lymph nodes. No new or progressive metastatic disease in the neck.    06/16/2016 Imaging    CT chest showed stable masslike radiation fibrosis in the left upper lobe. Stable subcentimeter pulmonary nodules in the bilateral lower lobes. No new or progressive metastatic disease in the chest. Nonobstructing left renal stone.    10/13/2016 Imaging    Ct neck showed unchanged right sternocleidomastoid muscle metastasis. Unchanged subcentimeter left cervical lymph nodes. No evidence of new or progressive metastatic disease in the neck.    10/13/2016 Imaging    CT chest showed tiny hypervascular foci in the liver, not definitely seen on prior imaging of 06/16/2016  and 02/28/2016. Abdomen MRI without and with contrast recommended to further evaluate as metastatic disease is a concern. 2. Stable appearance of post treatment changes left upper lung and scattered tiny bilateral pulmonary nodules.    02/11/2017 Imaging    Ct neck: Lymph node mass right posterior neck appears improved from the prior study. Small posterior lymph nodes on the left unchanged. Occluded right jugular vein unchanged.    02/11/2017 Imaging    1. Similar appearance of postsurgical and radiation changes in the left upper lobe. 2. Similar bilateral pulmonary nodules. 3. No thoracic adenopathy. 4. Subtle foci of post-contrast enhancement within the liver are suboptimally characterized on this nondedicated study. Likely similar. These could either be re-evaluated at followup or more entirely characterized with abdominal MRI. 5. Left nephrolithiasis.    05/19/2017 Imaging    Matted lymph node mass right posterior neck appears larger  in the recent CT. Accurate measurements difficult due to infiltrating tumor margins and infiltration of the muscle. Right jugular vein again appears occluded or resected. Small left posterior lymph nodes stable. Left upper lobe airspace density stable and similar to the prior CT    06/03/2017 PET scan    1. Hypermetabolic ill-defined right level IIb lymph node, about 1.3 cm in diameter with maximum SUV 9.5 (formerly 8.1). Appearance suspicious for residual/recurrent malignancy. No worrisome left-sided lesion. 2. Left suprahilar indistinct opacity demonstrates no worrisome hypermetabolic activity. The 5 mm left lower lobe pulmonary nodule is stable and not currently hypermetabolic although below sensitive PET-CT size thresholds. 3. Other imaging findings of potential clinical significance: Bilateral nonobstructive nephrolithiasis. Chronic bilateral maxillary sinusitis.    05/11/2018 PET scan    1. Continued chronic accentuated metabolic activity in the vicinity of  right level IIB and the adjacent right sternocleidomastoid muscle, with ill definition of surrounding tissue planes. Maximum SUV is currently 8.1, formerly 9.5. Accentuated metabolic activity is been present in this vicinity back through 06/25/2015, and there was also some low-level activity in this vicinity on 06/07/2014. Some of this may be from scarring and local muscular activity although clearly a component of residual tumor is difficult to exclude given the focally high activity. 2. Other imaging findings of potential clinical significance: Chronic bilateral maxillary sinusitis. Chronic scarring in the left upper lobe. Chronically stable 5 mm left lower lobe nodule is considered benign. Nonobstructive left nephrolithiasis.     REVIEW OF SYSTEMS:   Constitutional: Denies fevers, chills or abnormal weight loss Eyes: Denies blurriness of vision Ears, nose, mouth, throat, and face: Denies mucositis or sore throat Respiratory: Denies cough, dyspnea or wheezes Cardiovascular: Denies palpitation, chest discomfort or lower extremity swelling Gastrointestinal:  Denies nausea, heartburn or change in bowel habits Skin: Denies abnormal skin rashes Lymphatics: Denies new lymphadenopathy or easy bruising Neurological:Denies numbness, tingling or new weaknesses Behavioral/Psych: Mood is stable, no new changes  All other systems were reviewed with the patient and are negative.  I have reviewed the past medical history, past surgical history, social history and family history with the patient and they are unchanged from previous note.  ALLERGIES:  is allergic to heparin.  MEDICATIONS:  Current Outpatient Medications  Medication Sig Dispense Refill  . cetirizine (ZYRTEC ALLERGY) 10 MG tablet Take 1 tablet (10 mg total) by mouth daily. 30 tablet 2  . cyclobenzaprine (FLEXERIL) 5 MG tablet Take 1 tablet (5 mg total) by mouth 3 (three) times daily as needed for muscle spasms. (Patient not taking: Reported  on 07/28/2018) 60 tablet 0  . diclofenac (VOLTAREN) 50 MG EC tablet TAKE 1 TABLET BY MOUTH THREE TIMES DAILY AS NEEDED FOR MILD PAIN 270 tablet 0  . DULoxetine (CYMBALTA) 30 MG capsule Take 1 capsule (30 mg total) by mouth 2 (two) times daily. 60 capsule 5  . famotidine (PEPCID) 20 MG tablet Take 1 tablet (20 mg total) by mouth 2 (two) times daily. (Patient not taking: Reported on 07/28/2018) 30 tablet 0  . fexofenadine (ALLEGRA ALLERGY) 60 MG tablet Take 1 tablet (60 mg total) by mouth 2 (two) times daily. 60 tablet 2  . levothyroxine (SYNTHROID, LEVOTHROID) 125 MCG tablet Take 1 tablet (125 mcg total) by mouth daily before breakfast. 30 tablet 9  . lidocaine-prilocaine (EMLA) cream Apply 1 application topically as needed. (Patient taking differently: Apply 1 application topically as needed (for port access). ) 30 g 3  . LORazepam (ATIVAN) 0.5 MG tablet Take 1  tablet (0.5 mg total) by mouth 3 times/day as needed-between meals & bedtime for anxiety. 30 tablet 0  . meclizine (ANTIVERT) 25 MG tablet Take 1 tablet (25 mg total) by mouth 3 (three) times daily as needed for dizziness. (Patient not taking: Reported on 07/28/2018) 30 tablet 0  . mupirocin cream (BACTROBAN) 2 % Apply to the right neck 4 times daily. 30 g 1  . naproxen (NAPROSYN) 500 MG tablet Take 1 tablet (500 mg total) by mouth 2 (two) times daily as needed. (Patient not taking: Reported on 07/28/2018) 30 tablet 0  . omeprazole (PRILOSEC) 40 MG capsule Take 1 capsule (40 mg total) by mouth daily. 30 capsule 5  . oxyCODONE (ROXICODONE) 15 MG immediate release tablet Take 1 tablet (15 mg total) by mouth every 4 (four) hours as needed for pain. 60 tablet 0  . polyethylene glycol (MIRALAX) packet Take 17 g by mouth daily. 14 each 3   No current facility-administered medications for this visit.    Facility-Administered Medications Ordered in Other Visits  Medication Dose Route Frequency Provider Last Rate Last Dose  . sodium chloride 0.9 %  injection 10 mL  10 mL Intracatheter PRN Alvy Bimler, Kaylum Shrum, MD        PHYSICAL EXAMINATION: ECOG PERFORMANCE STATUS: 1 - Symptomatic but completely ambulatory  Vitals:   09/08/18 1425  BP: 117/73  Pulse: 84  Resp: 18  Temp: 98 F (36.7 C)  SpO2: 100%   Filed Weights   09/08/18 1425  Weight: 185 lb 6.4 oz (84.1 kg)    GENERAL:alert, no distress and comfortable SKIN: skin color, texture, turgor are normal, no rashes or significant lesions EYES: normal, Conjunctiva are pink and non-injected, sclera clear OROPHARYNX:no exudate, no erythema and lips, buccal mucosa, and tongue normal  NECK: Well-healed surgical scar with fibrotic changes in his neck.  There is a new neck lesion suspicious for recurrence of cancer LYMPH:  no palpable lymphadenopathy in the cervical, axillary or inguinal LUNGS: clear to auscultation and percussion with normal breathing effort HEART: regular rate & rhythm and no murmurs and no lower extremity edema ABDOMEN:abdomen soft, non-tender and normal bowel sounds Musculoskeletal:no cyanosis of digits and no clubbing  NEURO: alert & oriented x 3 with fluent speech, no focal motor/sensory deficits  LABORATORY DATA:  I have reviewed the data as listed    Component Value Date/Time   NA 139 08/26/2018 1154   NA 139 09/22/2017 0829   K 3.6 08/26/2018 1154   K 3.5 09/22/2017 0829   CL 103 08/26/2018 1154   CO2 27 08/26/2018 1154   CO2 26 09/22/2017 0829   GLUCOSE 152 (H) 08/26/2018 1154   GLUCOSE 133 09/22/2017 0829   BUN 15 08/26/2018 1154   BUN 14.1 09/22/2017 0829   CREATININE 0.87 08/26/2018 1154   CREATININE 0.9 09/22/2017 0829   CALCIUM 9.5 08/26/2018 1154   CALCIUM 9.1 09/22/2017 0829   PROT 7.1 08/26/2018 1154   PROT 6.8 09/22/2017 0829   ALBUMIN 4.1 08/26/2018 1154   ALBUMIN 4.1 09/22/2017 0829   AST 24 08/26/2018 1154   AST 22 09/22/2017 0829   ALT 63 (H) 08/26/2018 1154   ALT 30 09/22/2017 0829   ALKPHOS 65 08/26/2018 1154   ALKPHOS 55  09/22/2017 0829   BILITOT 0.3 08/26/2018 1154   BILITOT 0.35 09/22/2017 0829   GFRNONAA >60 08/26/2018 1154   GFRAA >60 08/26/2018 1154    No results found for: SPEP, UPEP  Lab Results  Component Value Date  WBC 7.9 08/26/2018   NEUTROABS 5.9 08/26/2018   HGB 13.7 08/26/2018   HCT 41.5 08/26/2018   MCV 89.1 08/26/2018   PLT 223 08/26/2018      Chemistry      Component Value Date/Time   NA 139 08/26/2018 1154   NA 139 09/22/2017 0829   K 3.6 08/26/2018 1154   K 3.5 09/22/2017 0829   CL 103 08/26/2018 1154   CO2 27 08/26/2018 1154   CO2 26 09/22/2017 0829   BUN 15 08/26/2018 1154   BUN 14.1 09/22/2017 0829   CREATININE 0.87 08/26/2018 1154   CREATININE 0.9 09/22/2017 0829      Component Value Date/Time   CALCIUM 9.5 08/26/2018 1154   CALCIUM 9.1 09/22/2017 0829   ALKPHOS 65 08/26/2018 1154   ALKPHOS 55 09/22/2017 0829   AST 24 08/26/2018 1154   AST 22 09/22/2017 0829   ALT 63 (H) 08/26/2018 1154   ALT 30 09/22/2017 0829   BILITOT 0.3 08/26/2018 1154   BILITOT 0.35 09/22/2017 0829     With his permission, picture is taken    All questions were answered. The patient knows to call the clinic with any problems, questions or concerns. No barriers to learning was detected.  I spent 15 minutes counseling the patient face to face. The total time spent in the appointment was 20 minutes and more than 50% was on counseling and review of test results  [name], MD 09/08/2018 3:15 PM

## 2018-09-08 NOTE — Assessment & Plan Note (Signed)
He has poorly controlled pain He will continue oxycodone along with NSAID as needed.  I refilled his prescriptions today

## 2018-09-09 ENCOUNTER — Telehealth: Payer: Self-pay | Admitting: Hematology and Oncology

## 2018-09-09 NOTE — Telephone Encounter (Signed)
Per 12/5 no new orders

## 2018-09-12 DIAGNOSIS — R221 Localized swelling, mass and lump, neck: Secondary | ICD-10-CM | POA: Insufficient documentation

## 2018-09-19 ENCOUNTER — Telehealth: Payer: Self-pay

## 2018-09-19 NOTE — Telephone Encounter (Signed)
Attempted to call. No answer or voicemail.

## 2018-09-19 NOTE — Telephone Encounter (Signed)
-----   Message from [name], MD sent at 09/19/2018  8:48 AM EST ----- Regarding: recurrent disease His path came back recurrent cancer. You can tell him I can see him tomorrow at 930 am or Wed 930 am to discuss. 30 mins

## 2018-09-19 NOTE — Telephone Encounter (Signed)
Called back and gave below message. He verbalized understanding. He will take appt for Wednesday at 0930.  Appointment scheduled.

## 2018-09-21 ENCOUNTER — Encounter: Payer: Self-pay | Admitting: Hematology and Oncology

## 2018-09-21 ENCOUNTER — Other Ambulatory Visit: Payer: Self-pay | Admitting: Hematology and Oncology

## 2018-09-21 ENCOUNTER — Inpatient Hospital Stay (HOSPITAL_BASED_OUTPATIENT_CLINIC_OR_DEPARTMENT_OTHER): Payer: Medicaid Other | Admitting: Hematology and Oncology

## 2018-09-21 ENCOUNTER — Telehealth: Payer: Self-pay | Admitting: Hematology and Oncology

## 2018-09-21 VITALS — BP 124/70 | HR 80 | Temp 98.1°F | Resp 18 | Ht 68.75 in | Wt 186.6 lb

## 2018-09-21 DIAGNOSIS — Z9221 Personal history of antineoplastic chemotherapy: Secondary | ICD-10-CM

## 2018-09-21 DIAGNOSIS — C760 Malignant neoplasm of head, face and neck: Secondary | ICD-10-CM

## 2018-09-21 DIAGNOSIS — C7800 Secondary malignant neoplasm of unspecified lung: Secondary | ICD-10-CM

## 2018-09-21 DIAGNOSIS — G893 Neoplasm related pain (acute) (chronic): Secondary | ICD-10-CM

## 2018-09-21 DIAGNOSIS — C119 Malignant neoplasm of nasopharynx, unspecified: Secondary | ICD-10-CM

## 2018-09-21 DIAGNOSIS — Z79899 Other long term (current) drug therapy: Secondary | ICD-10-CM

## 2018-09-21 DIAGNOSIS — Z923 Personal history of irradiation: Secondary | ICD-10-CM | POA: Diagnosis not present

## 2018-09-21 DIAGNOSIS — Z7189 Other specified counseling: Secondary | ICD-10-CM

## 2018-09-21 NOTE — Progress Notes (Signed)
OFF PATHWAY REGIMEN - Head and Neck  No Change  Continue With Treatment as Ordered.   [id number]:Cisplatin 80 mg/m2 IV D1 + Gemcitabine 1,000 mg/m2 IV D1,8 q21 Days x 3 Cycles:   A cycle is every 21 days:     Gemcitabine      Cisplatin   **Always confirm dose/schedule in your pharmacy ordering system**  Patient Characteristics: Nasopharyngeal, Metastatic, Third Line and Beyond Disease Classification: Nasopharyngeal Current Disease Status: Metastatic Disease AJCC T Category: TX AJCC N Category: NX AJCC M Category: M1 AJCC 8 Stage Grouping: IVB Line of Therapy: Third Line and Beyond  Intent of Therapy: Non-Curative / Palliative Intent, Discussed with Patient

## 2018-09-21 NOTE — Assessment & Plan Note (Signed)
He has worsening neck pain.  He will continue current prescription oxycodone as needed

## 2018-09-21 NOTE — Progress Notes (Signed)
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  Metastasis to lung He denies cough.  As above, I plan to order a PET CT scan for staging  Cancer associated pain He has worsening neck pain.  He will continue current prescription oxycodone as needed  Goals of care, counseling/discussion I had extensive goals of care with the patient Previously, his radiation oncologist had declined offering further radiation therapy as his neck has been irradiated more than 2 times Due to significant neck fibrosis and prior scarring from surgery, I do not think this is resectable. The patient would like to get a surgical opinion. I recommend him to call his ENT surgeon to set up appointment to discuss surgical option The patient is made aware that his disease is incurable   Orders Placed This Encounter  Procedures  . NM PET Image Restag (PS) Skull Base To Thigh    Standing Status:   Future    Standing Expiration Date:   09/22/2019    Order Specific  Question:   If indicated for the ordered procedure, I authorize the administration of a radiopharmaceutical per Radiology protocol    Answer:   Yes    Order Specific Question:   Preferred imaging location?    Answer:   [facility]    Order Specific Question:   Radiology Contrast Protocol - do NOT remove file path    Answer:   [email or web address]  . Magnesium    Standing Status:   Standing    Number of Occurrences:   22    Standing Expiration Date:   09/22/2019    INTERVAL HISTORY: Please see below for problem oriented charting. 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 Pan-endoscopy elsewhere showed cancer from nasopharyngeal space.    01/04/2007 - 02/20/2007 Chemotherapy    He received 2 cycles of cisplatin and 5FU followed by concurrent chemo with weekly cisplatin and radiation. He only received 2 doses of  chemo due to severe mucositis, nausea and weight loss.    04/05/2007 - 08/04/2007 Chemotherapy    He received 4 more courses of cisplatin with 5FU and had complete response    07/05/2009 Procedure    Fine-needle aspirate of the right level II lymph nodes come from recurrent metastatic disease. Repeat endoscopy and CT scan show no evidence of disease elsewhere.    07/08/2009 - 12/02/2009 Chemotherapy    He was given 6 cycles of  carboplatin, 5-FU and docetaxel    12/03/2009 Surgery    He has surgery to the residual lymph node on the right neck which showed no evidence of disease.    02/22/2012 Imaging    Repeat imaging study showed large recurrent mass. He was referred elsewhere for further treatment.    05/03/2012 Surgery    He underwent left upper lobectomy.    04/29/2013 Imaging    PEt scan showed lesion on right level II B and lower lung was abnormal    06/03/2013 - 02/02/2014 Chemotherapy    He had 6 cycles of chemotherapy when he was found to have recurrence of cancer and had received oxaliplatin and capecitabine    06/07/2014 Imaging    PET CT scan showed persistent disease in the right neck lymph nodes and left lung    06/29/2014 Procedure    Accession: [id number] repeat LUL biopsy confirmed metastatic cancer    07/18/2014 - 07/31/2014 Radiation Therapy    He received palliative radiation therapy to the lungs    10/10/2014 Imaging    CT scan of the chest, abdomen and pelvis show regression in the size of the lung nodule in the left upper lobe and stable pulmonary nodules    01/24/2015 Imaging    CT scan showed stable disease in neck and lung    06/19/2015 Imaging    CT scan of the neck and the chest show possible mild progression of the nodule in the right side of the neck.    06/25/2015 Imaging    PET scan confirmed disease recurrence in the neck    07/07/2015 Imaging    He had MRI neck at [facility]    09/03/2015 - 08/26/2018 Chemotherapy    He received palliative chemo with Nivolumab    10/29/2015 Imaging    PET CT showed positive response to Rx    02/28/2016 Imaging    Ct abdomen showed abnormal thinkening in his stomach    03/03/2016 Imaging    CT: Right sternocleidomastoid muscle metastasis appears less distinct but otherwise not significantly changed in size or configuration since 06/19/2015.2. Left level 3 lymph node which was hypermetabolic by PET-CT in January 2017 appears slightly smaller     04/01/2016 Imaging    CT cervical spine showed no acute fracture or traumatic malalignment in the cervical spine    04/22/2016 Procedure    Port-a-cath placed.    06/16/2016 Imaging    Ct neck showed right sternocleidomastoid muscle metastasis is further decreased in conspicuity since May, and has mildly decreased in size since September 2016. Continued stability of sub-centimeter left cervical lymph nodes. No new or progressive metastatic disease in the neck.    06/16/2016 Imaging    CT chest showed stable masslike radiation fibrosis in the left upper lobe. Stable subcentimeter pulmonary nodules in the bilateral lower lobes. No new or progressive metastatic disease in the chest. Nonobstructing left renal stone.    10/13/2016 Imaging    Ct neck showed unchanged right sternocleidomastoid muscle metastasis. Unchanged subcentimeter  left cervical lymph nodes. No evidence of new or progressive metastatic disease in the neck.    10/13/2016 Imaging    CT chest showed tiny hypervascular foci in the liver, not definitely seen on prior imaging of 06/16/2016 and 02/28/2016. Abdomen MRI without and with contrast recommended to further evaluate as metastatic disease is a concern. 2. Stable appearance of post treatment changes left upper lung and scattered tiny bilateral pulmonary nodules.    02/11/2017 Imaging    Ct neck: Lymph node mass right posterior neck appears improved from the prior study. Small posterior lymph nodes on the left unchanged. Occluded right jugular vein unchanged.    02/11/2017 Imaging    1. Similar appearance of postsurgical and radiation changes in the left upper lobe. 2. Similar bilateral pulmonary nodules. 3. No thoracic adenopathy. 4. Subtle foci of post-contrast enhancement within the liver are suboptimally characterized on this nondedicated study. Likely similar. These could either be re-evaluated at followup or more entirely characterized with abdominal MRI. 5. Left  nephrolithiasis.    05/19/2017 Imaging    Matted lymph node mass right posterior neck appears larger in the recent CT. Accurate measurements difficult due to infiltrating tumor margins and infiltration of the muscle. Right jugular vein again appears occluded or resected. Small left posterior lymph nodes stable. Left upper lobe airspace density stable and similar to the prior CT    06/03/2017 PET scan    1. Hypermetabolic ill-defined right level IIb lymph node, about 1.3 cm in diameter with maximum SUV 9.5 (formerly 8.1). Appearance suspicious for residual/recurrent malignancy. No worrisome left-sided lesion. 2. Left suprahilar indistinct opacity demonstrates no worrisome hypermetabolic activity. The 5 mm left lower lobe pulmonary nodule is stable and not currently hypermetabolic although below sensitive PET-CT size thresholds. 3. Other imaging findings of potential clinical significance: Bilateral nonobstructive nephrolithiasis. Chronic bilateral maxillary sinusitis.    05/11/2018 PET scan    1. Continued chronic accentuated metabolic activity in the vicinity of right level IIB and the adjacent right sternocleidomastoid muscle, with ill definition of surrounding tissue planes. Maximum SUV is currently 8.1, formerly 9.5. Accentuated metabolic activity is been present in this vicinity back through 06/25/2015, and there was also some low-level activity in this vicinity on 06/07/2014. Some of this may be from scarring and local muscular activity although clearly a component of residual tumor is difficult to exclude given the focally high activity. 2. Other imaging findings of potential clinical significance: Chronic bilateral maxillary sinusitis. Chronic scarring in the left upper lobe. Chronically stable 5 mm left lower lobe nodule is considered benign. Nonobstructive left nephrolithiasis.    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 COMMENT:No significant keratinization is identified. Other basaloid carcinomas are in the differential diagnosis. No cell block material is available for further testing.    09/12/2018 Procedure    He underwent fine Needle Aspiration    09/30/2018 -  Chemotherapy    The patient had palonosetron (ALOXI) injection 0.25 mg, 0.25 mg, Intravenous,  Once, 0 of 4 cycles CISplatin (PLATINOL) 162 mg in sodium chloride 0.9 % 500 mL chemo infusion, 80 mg/m2 = 162 mg (100 % of original dose 80 mg/m2), Intravenous,  Once, 0 of 4 cycles Dose modification: 80 mg/m2 (original dose 80 mg/m2, Cycle 1, Reason: Other (see comments)) gemcitabine (GEMZAR) 2,014 mg in sodium chloride 0.9 % 250 mL chemo infusion, 1,000 mg/m2 = 2,014 mg (  100 % of original dose 1,000 mg/m2), Intravenous,  Once, 0 of 4 cycles Dose modification: 1,000 mg/m2 (original dose 1,000 mg/m2, Cycle 1, Reason: Other (see comments)) fosaprepitant (EMEND) 150 mg, dexamethasone (DECADRON) 12 mg in sodium chloride 0.9 % 145 mL IVPB, , Intravenous,  Once, 0 of 4 cycles  for chemotherapy treatment.      REVIEW OF SYSTEMS:   Constitutional: Denies fevers, chills or abnormal weight loss Eyes: Denies blurriness of vision Ears, nose, mouth, throat, and face: Denies mucositis or sore throat Respiratory: Denies cough, dyspnea or wheezes Cardiovascular: Denies palpitation, chest discomfort or lower extremity swelling Gastrointestinal:  Denies nausea, heartburn or change in bowel habits Skin: Denies abnormal skin rashes Lymphatics: Denies new lymphadenopathy or easy bruising Neurological:Denies numbness, tingling or new weaknesses Behavioral/Psych: Mood is stable, no new changes  All other systems were reviewed with the patient and are negative.  I have reviewed the past medical history, past surgical history, social history and family history with the patient and they are  unchanged from previous note.  ALLERGIES:  is allergic to heparin.  MEDICATIONS:  Current Outpatient Medications  Medication Sig Dispense Refill  . cetirizine (ZYRTEC ALLERGY) 10 MG tablet Take 1 tablet (10 mg total) by mouth daily. 30 tablet 2  . cyclobenzaprine (FLEXERIL) 5 MG tablet Take 1 tablet (5 mg total) by mouth 3 (three) times daily as needed for muscle spasms. (Patient not taking: Reported on 07/28/2018) 60 tablet 0  . diclofenac (VOLTAREN) 50 MG EC tablet TAKE 1 TABLET BY MOUTH THREE TIMES DAILY AS NEEDED FOR MILD PAIN 270 tablet 0  . DULoxetine (CYMBALTA) 30 MG capsule Take 1 capsule (30 mg total) by mouth 2 (two) times daily. 60 capsule 5  . famotidine (PEPCID) 20 MG tablet Take 1 tablet (20 mg total) by mouth 2 (two) times daily. (Patient not taking: Reported on 07/28/2018) 30 tablet 0  . fexofenadine (ALLEGRA ALLERGY) 60 MG tablet Take 1 tablet (60 mg total) by mouth 2 (two) times daily. 60 tablet 2  . levothyroxine (SYNTHROID, LEVOTHROID) 125 MCG tablet Take 1 tablet (125 mcg total) by mouth daily before breakfast. 30 tablet 9  . lidocaine-prilocaine (EMLA) cream Apply 1 application topically as needed. (Patient taking differently: Apply 1 application topically as needed (for port access). ) 30 g 3  . LORazepam (ATIVAN) 0.5 MG tablet Take 1 tablet (0.5 mg total) by mouth 3 times/day as needed-between meals & bedtime for anxiety. 30 tablet 0  . meclizine (ANTIVERT) 25 MG tablet Take 1 tablet (25 mg total) by mouth 3 (three) times daily as needed for dizziness. (Patient not taking: Reported on 07/28/2018) 30 tablet 0  . mupirocin cream (BACTROBAN) 2 % Apply to the right neck 4 times daily. 30 g 1  . naproxen (NAPROSYN) 500 MG tablet Take 1 tablet (500 mg total) by mouth 2 (two) times daily as needed. (Patient not taking: Reported on 07/28/2018) 30 tablet 0  . omeprazole (PRILOSEC) 40 MG capsule Take 1 capsule (40 mg total) by mouth daily. 30 capsule 5  . oxyCODONE (ROXICODONE) 15  MG immediate release tablet Take 1 tablet (15 mg total) by mouth every 4 (four) hours as needed for pain. 60 tablet 0  . polyethylene glycol (MIRALAX) packet Take 17 g by mouth daily. 14 each 3   No current facility-administered medications for this visit.     PHYSICAL EXAMINATION: ECOG PERFORMANCE STATUS: 1 - Symptomatic but completely ambulatory  Vitals:   09/21/18 1002  BP: 124/70  Pulse: 80  Resp: 18  Temp: 98.1 F (36.7 C)  SpO2: 100%   Filed Weights   09/21/18 1002  Weight: 186 lb 9.6 oz (84.6 kg)    GENERAL:alert, no distress and comfortable SKIN: skin color, texture, turgor are normal, no rashes or significant lesions EYES: normal, Conjunctiva are pink and non-injected, sclera clear OROPHARYNX:no exudate, no erythema and lips, buccal mucosa, and tongue normal  NECK: Noted scarring from prior surgery and nodular growth consistent with cancer recurrence Musculoskeletal:no cyanosis of digits and no clubbing  NEURO: alert & oriented x 3 with fluent speech, no focal motor/sensory deficits  LABORATORY DATA:  I have reviewed the data as listed    Component Value Date/Time   NA 139 08/26/2018 1154   NA 139 09/22/2017 0829   K 3.6 08/26/2018 1154   K 3.5 09/22/2017 0829   CL 103 08/26/2018 1154   CO2 27 08/26/2018 1154   CO2 26 09/22/2017 0829   GLUCOSE 152 (H) 08/26/2018 1154   GLUCOSE 133 09/22/2017 0829   BUN 15 08/26/2018 1154   BUN 14.1 09/22/2017 0829   CREATININE 0.87 08/26/2018 1154   CREATININE 0.9 09/22/2017 0829   CALCIUM 9.5 08/26/2018 1154   CALCIUM 9.1 09/22/2017 0829   PROT 7.1 08/26/2018 1154   PROT 6.8 09/22/2017 0829   ALBUMIN 4.1 08/26/2018 1154   ALBUMIN 4.1 09/22/2017 0829   AST 24 08/26/2018 1154   AST 22 09/22/2017 0829   ALT 63 (H) 08/26/2018 1154   ALT 30 09/22/2017 0829   ALKPHOS 65 08/26/2018 1154   ALKPHOS 55 09/22/2017 0829   BILITOT 0.3 08/26/2018 1154   BILITOT 0.35 09/22/2017 0829   GFRNONAA >60 08/26/2018 1154   GFRAA >60  08/26/2018 1154    No results found for: SPEP, UPEP  Lab Results  Component Value Date   WBC 7.9 08/26/2018   NEUTROABS 5.9 08/26/2018   HGB 13.7 08/26/2018   HCT 41.5 08/26/2018   MCV 89.1 08/26/2018   PLT 223 08/26/2018      Chemistry      Component Value Date/Time   NA 139 08/26/2018 1154   NA 139 09/22/2017 0829   K 3.6 08/26/2018 1154   K 3.5 09/22/2017 0829   CL 103 08/26/2018 1154   CO2 27 08/26/2018 1154   CO2 26 09/22/2017 0829   BUN 15 08/26/2018 1154   BUN 14.1 09/22/2017 0829   CREATININE 0.87 08/26/2018 1154   CREATININE 0.9 09/22/2017 0829      Component Value Date/Time   CALCIUM 9.5 08/26/2018 1154   CALCIUM 9.1 09/22/2017 0829   ALKPHOS 65 08/26/2018 1154   ALKPHOS 55 09/22/2017 0829   AST 24 08/26/2018 1154   AST 22 09/22/2017 0829   ALT 63 (H) 08/26/2018 1154   ALT 30 09/22/2017 0829   BILITOT 0.3 08/26/2018 1154   BILITOT 0.35 09/22/2017 0829     I have reviewed outside pathology report All questions were answered. The patient knows to call the clinic with any problems, questions or concerns. No barriers to learning was detected.  I spent 30 minutes counseling the patient face to face. The total time spent in the appointment was 40 minutes and more than 50% was on counseling and review of test results  [name], MD 09/21/2018 11:17 AM

## 2018-09-21 NOTE — Assessment & Plan Note (Signed)
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

## 2018-09-21 NOTE — Progress Notes (Signed)
START OFF PATHWAY REGIMEN - Head and Neck   [id number]:Cisplatin 80 mg/m2 IV D1 + Gemcitabine 1,000 mg/m2 IV D1,8 q21 Days x 3 Cycles:   A cycle is every 21 days:     Gemcitabine      Cisplatin   **Always confirm dose/schedule in your pharmacy ordering system**  Patient Characteristics: Nasopharyngeal, Metastatic, Third Line and Beyond Disease Classification: Nasopharyngeal Current Disease Status: Metastatic Disease AJCC T Category: TX AJCC N Category: NX AJCC M Category: M1 AJCC 8 Stage Grouping: IVB Line of Therapy: Third Line and Beyond  Intent of Therapy: Non-Curative / Palliative Intent, Discussed with Patient

## 2018-09-21 NOTE — Telephone Encounter (Signed)
Per 12/18 no new orders

## 2018-09-21 NOTE — Assessment & Plan Note (Signed)
He denies cough.  As above, I plan to order a PET CT scan for staging

## 2018-09-21 NOTE — Assessment & Plan Note (Signed)
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 The patient would like to get a surgical opinion. I recommend him to call his ENT surgeon to set up appointment to discuss surgical option The patient is made aware that his disease is incurable

## 2018-09-23 ENCOUNTER — Other Ambulatory Visit: Payer: Medicaid Other

## 2018-09-23 ENCOUNTER — Ambulatory Visit: Payer: Medicaid Other

## 2018-09-23 ENCOUNTER — Ambulatory Visit: Payer: Medicaid Other | Admitting: Hematology and Oncology

## 2018-09-23 ENCOUNTER — Telehealth: Payer: Self-pay | Admitting: *Deleted

## 2018-09-23 NOTE — Telephone Encounter (Signed)
Patient called for a refill of his oxycodone, if possible to be sent to his pharmacy.  Patient states he feels the tumor is encroaching on his throat. He has been having slight difficulty swallowing a few times with periods of coughing. Discussed some softer options for food and smaller bites. Patient will try this.   He will be having the PET scan on 10/04/18 at 1:30pm.

## 2018-09-26 ENCOUNTER — Telehealth: Payer: Self-pay

## 2018-09-26 ENCOUNTER — Other Ambulatory Visit: Payer: Self-pay | Admitting: Hematology and Oncology

## 2018-09-26 MED ORDER — OXYCODONE HCL 15 MG PO TABS: 15.0000 mg | ORAL_TABLET | ORAL | 0 refills | Status: DC | PRN

## 2018-09-26 NOTE — Telephone Encounter (Signed)
Given below message. He verbalized understanding.  Scheduling message sent for appt for 10/06/18 for labs at 10am and Dr. Alvy Bimler at 1030. Given infusion appt time of 8 am 1/3 at [facility]. He verbalized understanding.

## 2018-09-26 NOTE — Telephone Encounter (Signed)
Called back. He is unable to get PET scan moved up. He is willing to drive to [facility] for the first treatment. Asking if he also needs a appt with Dr. Alvy Bimler.

## 2018-09-26 NOTE — Telephone Encounter (Signed)
Called and left a message asking him to call the office. He called the after hours number on 12/21 and left a message.  He is wanting to start chemo before PET scan. Left message per Dr. Alvy Bimler, he cannot start chemo until after PET scan. Left number for Radiology scheduler for him to call about getting a earlier appt. Left message that the chemo will be a 7 hour appt and the earliest appt is 1/6 unless he is interest is going to [facility] for the first treatment.

## 2018-09-26 NOTE — Telephone Encounter (Signed)
Called back. He will call regarding earlier appt for PET scan and call the office back. He is willing to go [facility] for the first treatment if they can do appt earlier.

## 2018-09-26 NOTE — Telephone Encounter (Signed)
I can see him on 1/2 with labs and 30 mins visit to review PET scan I can see him 1030, 1130 or 1230 pm, 30 mins He needs first dose cisplatin at [facility], 7 hours infusion Please try to coordinate appt with [facility]

## 2018-09-26 NOTE — Telephone Encounter (Signed)
I will send electronic medication refill

## 2018-10-04 ENCOUNTER — Ambulatory Visit (HOSPITAL_COMMUNITY)
Admission: RE | Admit: 2018-10-04 | Discharge: 2018-10-04 | Disposition: A | Discharge status: Home / Self Care | Payer: Medicaid Other | Source: Ambulatory Visit | Attending: Hematology and Oncology | Admitting: Hematology and Oncology

## 2018-10-04 DIAGNOSIS — C7800 Secondary malignant neoplasm of unspecified lung: Secondary | ICD-10-CM | POA: Diagnosis present

## 2018-10-04 DIAGNOSIS — C760 Malignant neoplasm of head, face and neck: Secondary | ICD-10-CM | POA: Insufficient documentation

## 2018-10-04 LAB — GLUCOSE, CAPILLARY: Glucose-Capillary: 106 mg/dL — ABNORMAL HIGH (ref 70–99)

## 2018-10-04 MED ORDER — FLUDEOXYGLUCOSE F - 18 (FDG) INJECTION
9.5500 | Freq: Once | INTRAVENOUS | Status: AC | PRN
  Administered 2018-10-04: 9.55 via INTRAVENOUS

## 2018-10-06 ENCOUNTER — Other Ambulatory Visit: Payer: Self-pay | Admitting: Hematology and Oncology

## 2018-10-06 ENCOUNTER — Telehealth: Payer: Self-pay | Admitting: *Deleted

## 2018-10-06 ENCOUNTER — Telehealth: Payer: Self-pay | Admitting: Hematology and Oncology

## 2018-10-06 ENCOUNTER — Encounter: Payer: Self-pay | Admitting: Hematology and Oncology

## 2018-10-06 ENCOUNTER — Inpatient Hospital Stay: Payer: Medicaid Other | Attending: Hematology and Oncology | Admitting: Hematology and Oncology

## 2018-10-06 ENCOUNTER — Inpatient Hospital Stay: Payer: Medicaid Other

## 2018-10-06 VITALS — BP 129/84 | HR 87 | Temp 97.5°F | Resp 18 | Ht 68.75 in | Wt 191.4 lb

## 2018-10-06 DIAGNOSIS — Z5111 Encounter for antineoplastic chemotherapy: Secondary | ICD-10-CM | POA: Diagnosis not present

## 2018-10-06 DIAGNOSIS — Z7189 Other specified counseling: Secondary | ICD-10-CM | POA: Insufficient documentation

## 2018-10-06 DIAGNOSIS — C760 Malignant neoplasm of head, face and neck: Secondary | ICD-10-CM

## 2018-10-06 DIAGNOSIS — G893 Neoplasm related pain (acute) (chronic): Secondary | ICD-10-CM | POA: Diagnosis not present

## 2018-10-06 DIAGNOSIS — C7800 Secondary malignant neoplasm of unspecified lung: Secondary | ICD-10-CM

## 2018-10-06 DIAGNOSIS — E039 Hypothyroidism, unspecified: Secondary | ICD-10-CM | POA: Insufficient documentation

## 2018-10-06 DIAGNOSIS — R112 Nausea with vomiting, unspecified: Secondary | ICD-10-CM | POA: Diagnosis not present

## 2018-10-06 DIAGNOSIS — C119 Malignant neoplasm of nasopharynx, unspecified: Secondary | ICD-10-CM | POA: Diagnosis present

## 2018-10-06 LAB — COMPREHENSIVE METABOLIC PANEL
ALBUMIN: 4.1 g/dL (ref 3.5–5.0)
ALT: 62 U/L — ABNORMAL HIGH (ref 0–44)
AST: 33 U/L (ref 15–41)
Alkaline Phosphatase: 65 U/L (ref 38–126)
Anion gap: 11 (ref 5–15)
BUN: 15 mg/dL (ref 6–20)
CHLORIDE: 101 mmol/L (ref 98–111)
CO2: 28 mmol/L (ref 22–32)
Calcium: 9.6 mg/dL (ref 8.9–10.3)
Creatinine, Ser: 1.08 mg/dL (ref 0.61–1.24)
GFR calc Af Amer: >60 mL/min (ref >60)
GFR calc non Af Amer: >60 mL/min (ref >60)
Glucose, Bld: 146 mg/dL — ABNORMAL HIGH (ref 70–99)
Potassium: 4.2 mmol/L (ref 3.5–5.1)
Sodium: 140 mmol/L (ref 135–145)
Total Bilirubin: 0.3 mg/dL (ref 0.3–1.2)
Total Protein: 7.4 g/dL (ref 6.5–8.1)

## 2018-10-06 LAB — CBC WITH DIFFERENTIAL/PLATELET
Abs Immature Granulocytes: 0.02 10*3/uL (ref 0.00–0.07)
BASOS PCT: 1 %
Basophils Absolute: 0 10*3/uL (ref 0.0–0.1)
Eosinophils Absolute: 0.2 10*3/uL (ref 0.0–0.5)
Eosinophils Relative: 3 %
HCT: 43.4 % (ref 39.0–52.0)
Hemoglobin: 14.3 g/dL (ref 13.0–17.0)
Immature Granulocytes: 0 %
Lymphocytes Relative: 22 %
Lymphs Abs: 1.4 10*3/uL (ref 0.7–4.0)
MCH: 29.5 pg (ref 26.0–34.0)
MCHC: 32.9 g/dL (ref 30.0–36.0)
MCV: 89.5 fL (ref 80.0–100.0)
Monocytes Absolute: 0.6 10*3/uL (ref 0.1–1.0)
Monocytes Relative: 9 %
Neutro Abs: 4 10*3/uL (ref 1.7–7.7)
Neutrophils Relative %: 65 %
PLATELETS: 199 10*3/uL (ref 150–400)
RBC: 4.85 MIL/uL (ref 4.22–5.81)
RDW: 13.4 % (ref 11.5–15.5)
WBC: 6.2 10*3/uL (ref 4.0–10.5)
nRBC: 0 % (ref 0.0–0.2)

## 2018-10-06 LAB — MAGNESIUM: Magnesium: 2 mg/dL (ref 1.7–2.4)

## 2018-10-06 LAB — TSH: TSH: 5.575 u[IU]/mL — ABNORMAL HIGH (ref 0.320–4.118)

## 2018-10-06 MED ORDER — ONDANSETRON HCL 8 MG PO TABS: 8.0000 mg | ORAL_TABLET | Freq: Three times a day (TID) | ORAL | 1 refills | Status: DC | PRN

## 2018-10-06 MED ORDER — PROCHLORPERAZINE MALEATE 10 MG PO TABS: 10.0000 mg | ORAL_TABLET | Freq: Four times a day (QID) | ORAL | 1 refills | Status: DC | PRN

## 2018-10-06 MED ORDER — LEVOTHYROXINE SODIUM 137 MCG PO TABS: 125.0000 ug | ORAL_TABLET | Freq: Every day | ORAL | 1 refills | Status: DC

## 2018-10-06 MED ORDER — DEXAMETHASONE 4 MG PO TABS: ORAL_TABLET | ORAL | 1 refills | Status: DC

## 2018-10-06 NOTE — Progress Notes (Signed)
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 The supportive date for this regimen is based on publication from [employer] in Lancet 2916 Volume 388, Issue 10054, 15-26 July 2015, Pages [id number] Gemcitabine plus cisplatin versus fluorouracil plus cisplatin in recurrent or metastatic nasopharyngeal carcinoma: a multicentre, randomised, open-label, phase 3 trial  Background Outcomes are poor for patients with recurrent or metastatic nasopharyngeal carcinoma and no well established first-line chemotherapy is available for the disease. 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  Methods In this multicentre, randomised, open-label, phase 3 trial, patients with recurrent or metastatic nasopharyngeal carcinoma were recruited from 22 hospitals in [place]. Key inclusion criteria were [facility] performance status of 0 or 1, adequate organ function, and measurable lesions according to Response Evaluation Criteria in Solid Tumors version 1.1. Patients were randomly assigned in a 1:1 ratio to receive either gemcitabine (1 g/m2 intravenously on days 1 and 8) and cisplatin (80 mg/m2 intravenously on day 1), or fluorouracil (4 g/m2 in continuous intravenous infusion over 96 h) and cisplatin (80 mg/m2 on day 1 given intravenously) once every 3 weeks for a maximum of six cycles. The randomisation was done centrally via an interactive phone response system using block randomisation with a size of six. The primary endpoint was progression-free survival assessed by the independent image committee in the intention-to-treat population. Safety analyses were done in patients who received at least one cycle of study drug. This study is ongoing and is registered with [employer], number [id number].  Findings Between Nov 24, 2010, and Aug 03, 2014, 362 patients were randomly assigned to a group (181 to the gemcitabine [plus cisplatin] group and 181 to the fluorouracil [plus cisplatin] group). Median follow-up time for progression-free survival was 194 months (IQR 121-356). The median progression-free survival was 70 months (44-109) in the gemcitabine group and 56 months (30-70) in the fluorouracil group (hazard ratio [HR] 055 [95% CI 044-068]; p<00001). A total of 180 patients in the gemcitabine group and 173 patients in the fluorouracil group were included in the safety analysis. Significantly different treatment-related grade 3 or 4 adverse events between the gemcitabine and fluorouracil groups were leucopenia (52 [29%] vs 15 [9%]; <00001), neutropenia (41 [23%] vs 23 [13%]; p=00251), thrombocytopenia (24 [13%] vs three [2%]; p=00007), and mucosal inflammation (0 vs 25 [14%]; <00001). Serious treatment-related adverse events occurred in seven (4%) patients in the gemcitabine  group and ten (6%) in the fluorouracil group. Six (3%) patients  in the gemcitabine group and 14 (8%) patients in the fluorouracil group discontinued treatment because of drug-related adverse events. No treatment-related deaths occurred in either group.  Interpretation Gemcitabine plus cisplatin prolongs progression-free survival in patients with recurrent or metastatic nasopharyngeal carcinoma. The results establish gemcitabine plus cisplatin as the standard first-line treatment option for this population.  We discussed the role of chemotherapy. The intent is of palliative intent.  We discussed some of the risks, benefits, side-effects of cisplatin and gemcitabine  Some of the short term side-effects included, though not limited to, including weight loss, life threatening infections, risk of allergic reactions, need for transfusions of blood products, nausea, vomiting, change in bowel habits, loss of hair, admission to hospital for various reasons, and risks of death.   Long term side-effects are also discussed including risks of infertility, permanent damage to nerve function, hearing loss, chronic fatigue, kidney damage with possibility needing hemodialysis, and rare secondary malignancy including bone marrow disorders.  The patient is aware that the response rates discussed earlier is not guaranteed.  After a long discussion, patient made an informed decision to proceed with the prescribed plan of care.   Patient education material was dispensed.  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  We will continue the same  Goals of care,  counseling/discussion I had extensive goals of care with the patient Previously, his radiation oncologist had declined offering further radiation therapy as his neck has been irradiated more than 2 times Due to significant neck fibrosis and prior scarring from surgery, I do not think this is resectable. The patient would like to get a surgical opinion. The patient is made aware that his disease is incurable   No orders of the defined types were placed in this encounter.   INTERVAL HISTORY: Please see below for problem oriented charting. He returns for chemotherapy consent and review of test results Since the last time I saw him, his skin lesion is enlarging.  He denies bleeding. His pain is well controlled with current prescription oxycodone He had some mild constipation, resolved with laxative.  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 Pan-endoscopy elsewhere showed cancer from nasopharyngeal space.    01/04/2007 - 02/20/2007 Chemotherapy    He received 2 cycles of cisplatin and 5FU followed by concurrent chemo with weekly cisplatin and radiation. He only received 2 doses of chemo due to severe mucositis, nausea and weight loss.    04/05/2007 - 08/04/2007 Chemotherapy    He received 4 more courses of cisplatin with 5FU and had complete response    07/05/2009 Procedure  Fine-needle aspirate of the right level II lymph nodes come from recurrent metastatic disease. Repeat endoscopy and CT scan show  no evidence of disease elsewhere.    07/08/2009 - 12/02/2009 Chemotherapy    He was given 6 cycles of carboplatin, 5-FU and docetaxel    12/03/2009 Surgery    He has surgery to the residual lymph node on the right neck which showed no evidence of disease.    02/22/2012 Imaging    Repeat imaging study showed large recurrent mass. He was referred elsewhere for further treatment.    05/03/2012 Surgery    He underwent left upper lobectomy.    04/29/2013 Imaging    PEt scan showed lesion on right level II B and lower lung was abnormal    06/03/2013 - 02/02/2014 Chemotherapy    He had 6 cycles of chemotherapy when he was found to have recurrence of cancer and had received oxaliplatin and capecitabine    06/07/2014 Imaging    PET CT scan showed persistent disease in the right neck lymph nodes and left lung    06/29/2014 Procedure    Accession: [id number] repeat LUL biopsy confirmed metastatic cancer    07/18/2014 - 07/31/2014 Radiation Therapy    He received palliative radiation therapy to the lungs    10/10/2014 Imaging    CT scan of the chest, abdomen and pelvis show regression in the size of the lung nodule in the left upper lobe and stable pulmonary nodules    01/24/2015 Imaging    CT scan showed stable disease in neck and lung    06/19/2015 Imaging    CT scan of the neck and the chest show possible mild progression of the nodule in the right side of the neck.    06/25/2015 Imaging    PET scan confirmed disease recurrence in the neck    07/07/2015 Imaging    He had MRI neck [facility]    09/03/2015 - 08/26/2018 Chemotherapy    He received palliative chemo with Nivolumab    10/29/2015 Imaging    PET CT showed positive response to Rx    02/28/2016 Imaging    Ct abdomen showed abnormal thinkening in his stomach    03/03/2016 Imaging    CT: Right sternocleidomastoid muscle metastasis appears less distinct but otherwise not significantly changed in size or configuration since 06/19/2015.2. Left  level 3 lymph node which was hypermetabolic by PET-CT in January 2017 appears slightly smaller    04/01/2016 Imaging    CT cervical spine showed no acute fracture or traumatic malalignment in the cervical spine    04/22/2016 Procedure    Port-a-cath placed.    06/16/2016 Imaging    Ct neck showed right sternocleidomastoid muscle metastasis is further decreased in conspicuity since May, and has mildly decreased in size since September 2016. Continued stability of sub-centimeter left cervical lymph nodes. No new or progressive metastatic disease in the neck.    06/16/2016 Imaging    CT chest showed stable masslike radiation fibrosis in the left upper lobe. Stable subcentimeter pulmonary nodules in the bilateral lower lobes. No new or progressive metastatic disease in the chest. Nonobstructing left renal stone.    10/13/2016 Imaging    Ct neck showed unchanged right sternocleidomastoid muscle metastasis. Unchanged subcentimeter left cervical lymph nodes. No evidence of new or progressive metastatic disease in the neck.    10/13/2016 Imaging    CT chest showed tiny hypervascular foci in the liver, not definitely seen on prior imaging of 06/16/2016  and 02/28/2016. Abdomen MRI without and with contrast recommended to further evaluate as metastatic disease is a concern. 2. Stable appearance of post treatment changes left upper lung and scattered tiny bilateral pulmonary nodules.    02/11/2017 Imaging    Ct neck: Lymph node mass right posterior neck appears improved from the prior study. Small posterior lymph nodes on the left unchanged. Occluded right jugular vein unchanged.    02/11/2017 Imaging    1. Similar appearance of postsurgical and radiation changes in the left upper lobe. 2. Similar bilateral pulmonary nodules. 3. No thoracic adenopathy. 4. Subtle foci of post-contrast enhancement within the liver are suboptimally characterized on this nondedicated study. Likely similar. These could either be  re-evaluated at followup or more entirely characterized with abdominal MRI. 5. Left nephrolithiasis.    05/19/2017 Imaging    Matted lymph node mass right posterior neck appears larger in the recent CT. Accurate measurements difficult due to infiltrating tumor margins and infiltration of the muscle. Right jugular vein again appears occluded or resected. Small left posterior lymph nodes stable. Left upper lobe airspace density stable and similar to the prior CT    06/03/2017 PET scan    1. Hypermetabolic ill-defined right level IIb lymph node, about 1.3 cm in diameter with maximum SUV 9.5 (formerly 8.1). Appearance suspicious for residual/recurrent malignancy. No worrisome left-sided lesion. 2. Left suprahilar indistinct opacity demonstrates no worrisome hypermetabolic activity. The 5 mm left lower lobe pulmonary nodule is stable and not currently hypermetabolic although below sensitive PET-CT size thresholds. 3. Other imaging findings of potential clinical significance: Bilateral nonobstructive nephrolithiasis. Chronic bilateral maxillary sinusitis.    05/11/2018 PET scan    1. Continued chronic accentuated metabolic activity in the vicinity of right level IIB and the adjacent right sternocleidomastoid muscle, with ill definition of surrounding tissue planes. Maximum SUV is currently 8.1, formerly 9.5. Accentuated metabolic activity is been present in this vicinity back through 06/25/2015, and there was also some low-level activity in this vicinity on 06/07/2014. Some of this may be from scarring and local muscular activity although clearly a component of residual tumor is difficult to exclude given the focally high activity. 2. Other imaging findings of potential clinical significance: Chronic bilateral maxillary sinusitis. Chronic scarring in the left upper lobe. Chronically stable 5 mm left lower lobe nodule is considered benign. Nonobstructive left nephrolithiasis.    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 COMMENT:No significant keratinization is identified. Other basaloid carcinomas are in the differential diagnosis. No cell block material is available for further testing.    09/12/2018 Procedure    He underwent fine Needle Aspiration    10/04/2018 PET scan    1. Significant progression of local recurrence laterally in the mid right neck with an enlarging, increasingly hypermetabolic soft tissue mass. This involves the right sternocleidomastoid muscle. 2. Small lymph nodes in the right axilla are increasingly hypermetabolic. These are nonspecific and potentially reactive, although could reflect a small metastases. Small hypermetabolic nodule in the left suprasternal notch is unchanged. 3. No other evidence of metastatic disease.      REVIEW OF SYSTEMS:   Constitutional: Denies fevers, chills or abnormal weight loss Eyes: Denies blurriness of vision Ears, nose, mouth, throat, and face: Denies mucositis or sore throat Respiratory: Denies cough, dyspnea or wheezes Cardiovascular: Denies palpitation, chest discomfort or lower extremity swelling Gastrointestinal:  Denies nausea, heartburn or change  in bowel habits Skin: Denies abnormal skin rashes Lymphatics: Denies new lymphadenopathy or easy bruising Neurological:Denies numbness, tingling or new weaknesses Behavioral/Psych: Mood is stable, no new changes  All other systems were reviewed with the patient and are negative.  I have reviewed the past medical history, past surgical history, social history and family history with the patient and they are unchanged from previous note.  ALLERGIES:  is allergic to heparin.  MEDICATIONS:  Current Outpatient Medications  Medication Sig Dispense Refill  . cetirizine (ZYRTEC ALLERGY) 10 MG tablet Take 1 tablet (10 mg total) by mouth daily.  30 tablet 2  . dexamethasone (DECADRON) 4 MG tablet Take 2 tablets by mouth after cisplatin chemotherapy for 2 days in the morning. Take with food. 30 tablet 1  . diclofenac (VOLTAREN) 50 MG EC tablet TAKE 1 TABLET BY MOUTH THREE TIMES DAILY AS NEEDED FOR MILD PAIN 270 tablet 0  . DULoxetine (CYMBALTA) 30 MG capsule Take 1 capsule (30 mg total) by mouth 2 (two) times daily. 60 capsule 5  . levothyroxine (SYNTHROID, LEVOTHROID) 125 MCG tablet Take 1 tablet (125 mcg total) by mouth daily before breakfast. 30 tablet 9  . lidocaine-prilocaine (EMLA) cream Apply 1 application topically as needed. (Patient taking differently: Apply 1 application topically as needed (for port access). ) 30 g 3  . LORazepam (ATIVAN) 0.5 MG tablet Take 1 tablet (0.5 mg total) by mouth 3 times/day as needed-between meals & bedtime for anxiety. 30 tablet 0  . omeprazole (PRILOSEC) 40 MG capsule Take 1 capsule (40 mg total) by mouth daily. 30 capsule 5  . ondansetron (ZOFRAN) 8 MG tablet Take 1 tablet (8 mg total) by mouth every 8 (eight) hours as needed. Start on the third day after cisplatin chemotherapy. 30 tablet 1  . oxyCODONE (ROXICODONE) 15 MG immediate release tablet Take 1 tablet (15 mg total) by mouth every 4 (four) hours as needed for pain. 60 tablet 0  . polyethylene glycol (MIRALAX) packet Take 17 g by mouth daily. 14 each 3  . prochlorperazine (COMPAZINE) 10 MG tablet Take 1 tablet (10 mg total) by mouth every 6 (six) hours as needed (Nausea or vomiting). 30 tablet 1   No current facility-administered medications for this visit.     PHYSICAL EXAMINATION: ECOG PERFORMANCE STATUS: 1 - Symptomatic but completely ambulatory  Vitals:   10/06/18 1016  BP: 129/84  Pulse: 87  Resp: 18  Temp: (!) 97.5 F (36.4 C)  SpO2: 100%   Filed Weights   10/06/18 1016  Weight: 191 lb 6.4 oz (86.8 kg)    GENERAL:alert, no distress and comfortable NECK: His neck is fibrosed from prior surgery and radiation.  Enlarging  skin lesion is noted Musculoskeletal:no cyanosis of digits and no clubbing  NEURO: alert & oriented x 3 with fluent speech, no focal motor/sensory deficits     LABORATORY DATA:  I have reviewed the data as listed    Component Value Date/Time   NA 140 10/06/2018 0951   NA 139 09/22/2017 0829   K 4.2 10/06/2018 0951   K 3.5 09/22/2017 0829   CL 101 10/06/2018 0951   CO2 28 10/06/2018 0951   CO2 26 09/22/2017 0829   GLUCOSE 146 (H) 10/06/2018 0951   GLUCOSE 133 09/22/2017 0829   BUN 15 10/06/2018 0951   BUN 14.1 09/22/2017 0829   CREATININE 1.08 10/06/2018 0951   CREATININE 0.9 09/22/2017 0829   CALCIUM 9.6 10/06/2018 0951   CALCIUM 9.1 09/22/2017 0829   PROT  7.4 10/06/2018 0951   PROT 6.8 09/22/2017 0829   ALBUMIN 4.1 10/06/2018 0951   ALBUMIN 4.1 09/22/2017 0829   AST 33 10/06/2018 0951   AST 22 09/22/2017 0829   ALT 62 (H) 10/06/2018 0951   ALT 30 09/22/2017 0829   ALKPHOS 65 10/06/2018 0951   ALKPHOS 55 09/22/2017 0829   BILITOT 0.3 10/06/2018 0951   BILITOT 0.35 09/22/2017 0829   GFRNONAA >60 10/06/2018 0951   GFRAA >60 10/06/2018 0951    No results found for: SPEP, UPEP  Lab Results  Component Value Date   WBC 6.2 10/06/2018   NEUTROABS 4.0 10/06/2018   HGB 14.3 10/06/2018   HCT 43.4 10/06/2018   MCV 89.5 10/06/2018   PLT 199 10/06/2018      Chemistry      Component Value Date/Time   NA 140 10/06/2018 0951   NA 139 09/22/2017 0829   K 4.2 10/06/2018 0951   K 3.5 09/22/2017 0829   CL 101 10/06/2018 0951   CO2 28 10/06/2018 0951   CO2 26 09/22/2017 0829   BUN 15 10/06/2018 0951   BUN 14.1 09/22/2017 0829   CREATININE 1.08 10/06/2018 0951   CREATININE 0.9 09/22/2017 0829      Component Value Date/Time   CALCIUM 9.6 10/06/2018 0951   CALCIUM 9.1 09/22/2017 0829   ALKPHOS 65 10/06/2018 0951   ALKPHOS 55 09/22/2017 0829   AST 33 10/06/2018 0951   AST 22 09/22/2017 0829   ALT 62 (H) 10/06/2018 0951   ALT 30 09/22/2017 0829   BILITOT 0.3  10/06/2018 0951   BILITOT 0.35 09/22/2017 0829       RADIOGRAPHIC STUDIES: I have reviewed imaging study with the patient I have personally reviewed the radiological images as listed and agreed with the findings in the report. 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 EXAM: NUCLEAR MEDICINE PET SKULL BASE TO THIGH TECHNIQUE: 9.55 mCi F-18 FDG was injected intravenously. Full-ring PET imaging was performed from the skull base to thigh after the radiotracer. CT data was obtained and used for attenuation correction and anatomic localization. Fasting blood glucose: 106 mg/dl COMPARISON:  PET-CT 05/11/2018 and 06/03/2017.  Neck CT 05/19/2017. FINDINGS: Mediastinal blood pool activity: SUV max 2.2 NECK: There is an enlarging hypermetabolic mass in the mid right neck laterally, which is exophytic and measuring up to 3.4 x 2.9 cm on image 29/4. This has an SUV max of 22.2 (previously 8.1). This mass involves the right sternocleidomastoid muscle. No new or enlarging hypermetabolic nodal masses are identified. There is a stable mildly hypermetabolic nodule in the left suprasternal notch, measuring 9 x 16 mm on image 52 and demonstrating an SUV max of 3.5.There are no lesions of the pharyngeal mucosal space. Incidental CT findings: none CHEST: There are no hypermetabolic mediastinal or hilar lymph nodes. There are small nodes inferiorly in the right axilla which have mildly enlarged compared with the prior study (measuring up to 8 mm on image 76/4) and are mildly hypermetabolic (SUV max 3.7). No suspicious pulmonary activity. Incidental CT findings: Stable radiation changes in both upper lobes. Stable subpleural 5 mm left lower lobe nodule on image 33/8. ABDOMEN/PELVIS: There is no hypermetabolic activity within the liver, adrenal glands, spleen or pancreas. There is no hypermetabolic  nodal activity. Incidental CT findings: none SKELETON: There is no hypermetabolic activity to suggest osseous metastatic disease. Decreased activity in  the upper thoracic spine attributed to previous radiation therapy. Incidental CT findings: none IMPRESSION: 1. Significant progression of local recurrence laterally in the mid right neck with an enlarging, increasingly hypermetabolic soft tissue mass. This involves the right sternocleidomastoid muscle. 2. Small lymph nodes in the right axilla are increasingly hypermetabolic. These are nonspecific and potentially reactive, although could reflect a small metastases. Small hypermetabolic nodule in the left suprasternal notch is unchanged. 3. No other evidence of metastatic disease. Electronically Signed   By: Richardean Sale M.D.   On: 10/04/2018 16:27    All questions were answered. The patient knows to call the clinic with any problems, questions or concerns. No barriers to learning was detected.  I spent 25 minutes counseling the patient face to face. The total time spent in the appointment was 40 minutes and more than 50% was on counseling and review of test results  [name], MD 10/06/2018 10:40 AM

## 2018-10-06 NOTE — Patient Instructions (Signed)
Cisplatin injection  What is this medicine?  CISPLATIN (SIS pla tin) is a chemotherapy drug. It targets fast dividing cells, like cancer cells, and causes these cells to die. This medicine is used to treat many types of cancer like bladder, ovarian, and testicular cancers.  This medicine may be used for other purposes; ask your health care provider or pharmacist if you have questions.  COMMON BRAND NAME(S): Platinol, Platinol -AQ  What should I tell my health care provider before I take this medicine?  They need to know if you have any of these conditions:  -blood disorders  -hearing problems  -kidney disease  -recent or ongoing radiation therapy  -an unusual or allergic reaction to cisplatin, carboplatin, other chemotherapy, other medicines, foods, dyes, or preservatives  -pregnant or trying to get pregnant  -breast-feeding  How should I use this medicine?  This drug is given as an infusion into a vein. It is administered in a hospital or clinic by a specially trained health care professional.  Talk to your pediatrician regarding the use of this medicine in children. Special care may be needed.  Overdosage: If you think you have taken too much of this medicine contact a poison control center or emergency room at once.  NOTE: This medicine is only for you. Do not share this medicine with others.  What if I miss a dose?  It is important not to miss a dose. Call your doctor or health care professional if you are unable to keep an appointment.  What may interact with this medicine?  -dofetilide  -foscarnet  -medicines for seizures  -medicines to increase blood counts like filgrastim, pegfilgrastim, sargramostim  -probenecid  -pyridoxine used with altretamine  -rituximab  -some antibiotics like amikacin, gentamicin, neomycin, polymyxin B, streptomycin, tobramycin  -sulfinpyrazone  -vaccines  -zalcitabine  Talk to your doctor or health care professional before taking any of these  medicines:  -acetaminophen  -aspirin  -ibuprofen  -ketoprofen  -naproxen  This list may not describe all possible interactions. Give your health care provider a list of all the medicines, herbs, non-prescription drugs, or dietary supplements you use. Also tell them if you smoke, drink alcohol, or use illegal drugs. Some items may interact with your medicine.  What should I watch for while using this medicine?  Your condition will be monitored carefully while you are receiving this medicine. You will need important blood work done while you are taking this medicine.  This drug may make you feel generally unwell. This is not uncommon, as chemotherapy can affect healthy cells as well as cancer cells. Report any side effects. Continue your course of treatment even though you feel ill unless your doctor tells you to stop.  In some cases, you may be given additional medicines to help with side effects. Follow all directions for their use.  Call your doctor or health care professional for advice if you get a fever, chills or sore throat, or other symptoms of a cold or flu. Do not treat yourself. This drug decreases your body's ability to fight infections. Try to avoid being around people who are sick.  This medicine may increase your risk to bruise or bleed. Call your doctor or health care professional if you notice any unusual bleeding.  Be careful brushing and flossing your teeth or using a toothpick because you may get an infection or bleed more easily. If you have any dental work done, tell your dentist you are receiving this medicine.  Avoid taking products   that contain aspirin, acetaminophen, ibuprofen, naproxen, or ketoprofen unless instructed by your doctor. These medicines may hide a fever.  Do not become pregnant while taking this medicine. Women should inform their doctor if they wish to become pregnant or think they might be pregnant. There is a potential for serious side effects to an unborn child. Talk to  your health care professional or pharmacist for more information. Do not breast-feed an infant while taking this medicine.  Drink fluids as directed while you are taking this medicine. This will help protect your kidneys.  Call your doctor or health care professional if you get diarrhea. Do not treat yourself.  What side effects may I notice from receiving this medicine?  Side effects that you should report to your doctor or health care professional as soon as possible:  -allergic reactions like skin rash, itching or hives, swelling of the face, lips, or tongue  -signs of infection - fever or chills, cough, sore throat, pain or difficulty passing urine  -signs of decreased platelets or bleeding - bruising, pinpoint red spots on the skin, black, tarry stools, nosebleeds  -signs of decreased red blood cells - unusually weak or tired, fainting spells, lightheadedness  -breathing problems  -changes in hearing  -gout pain  -low blood counts - This drug may decrease the number of white blood cells, red blood cells and platelets. You may be at increased risk for infections and bleeding.  -nausea and vomiting  -pain, swelling, redness or irritation at the injection site  -pain, tingling, numbness in the hands or feet  -problems with balance, movement  -trouble passing urine or change in the amount of urine  Side effects that usually do not require medical attention (report to your doctor or health care professional if they continue or are bothersome):  -changes in vision  -loss of appetite  -metallic taste in the mouth or changes in taste  This list may not describe all possible side effects. Call your doctor for medical advice about side effects. You may report side effects to FDA at 1-800-FDA-1088.  Where should I keep my medicine?  This drug is given in a hospital or clinic and will not be stored at home.  NOTE: This sheet is a summary. It may not cover all possible information. If you have questions about this medicine,  talk to your doctor, pharmacist, or health care provider.   2019 Elsevier/Gold Standard (2007-12-27 14:40:54)    Gemcitabine injection  What is this medicine?  GEMCITABINE (jem SYE ta been) is a chemotherapy drug. This medicine is used to treat many types of cancer like breast cancer, lung cancer, pancreatic cancer, and ovarian cancer.  This medicine may be used for other purposes; ask your health care provider or pharmacist if you have questions.  COMMON BRAND NAME(S): Gemzar, Infugem  What should I tell my health care provider before I take this medicine?  They need to know if you have any of these conditions:  -blood disorders  -infection  -kidney disease  -liver disease  -lung or breathing disease, like asthma  -recent or ongoing radiation therapy  -an unusual or allergic reaction to gemcitabine, other chemotherapy, other medicines, foods, dyes, or preservatives  -pregnant or trying to get pregnant  -breast-feeding  How should I use this medicine?  This drug is given as an infusion into a vein. It is administered in a hospital or clinic by a specially trained health care professional.  Talk to your pediatrician regarding the use of   this medicine in children. Special care may be needed.  Overdosage: If you think you have taken too much of this medicine contact a poison control center or emergency room at once.  NOTE: This medicine is only for you. Do not share this medicine with others.  What if I miss a dose?  It is important not to miss your dose. Call your doctor or health care professional if you are unable to keep an appointment.  What may interact with this medicine?  -medicines to increase blood counts like filgrastim, pegfilgrastim, sargramostim  -some other chemotherapy drugs like cisplatin  -vaccines  Talk to your doctor or health care professional before taking any of these medicines:  -acetaminophen  -aspirin  -ibuprofen  -ketoprofen  -naproxen  This list may not describe all possible interactions.  Give your health care provider a list of all the medicines, herbs, non-prescription drugs, or dietary supplements you use. Also tell them if you smoke, drink alcohol, or use illegal drugs. Some items may interact with your medicine.  What should I watch for while using this medicine?  Visit your doctor for checks on your progress. This drug may make you feel generally unwell. This is not uncommon, as chemotherapy can affect healthy cells as well as cancer cells. Report any side effects. Continue your course of treatment even though you feel ill unless your doctor tells you to stop.  In some cases, you may be given additional medicines to help with side effects. Follow all directions for their use.  Call your doctor or health care professional for advice if you get a fever, chills or sore throat, or other symptoms of a cold or flu. Do not treat yourself. This drug decreases your body's ability to fight infections. Try to avoid being around people who are sick.  This medicine may increase your risk to bruise or bleed. Call your doctor or health care professional if you notice any unusual bleeding.  Be careful brushing and flossing your teeth or using a toothpick because you may get an infection or bleed more easily. If you have any dental work done, tell your dentist you are receiving this medicine.  Avoid taking products that contain aspirin, acetaminophen, ibuprofen, naproxen, or ketoprofen unless instructed by your doctor. These medicines may hide a fever.  Do not become pregnant while taking this medicine or for 6 months after stopping it. Women should inform their doctor if they wish to become pregnant or think they might be pregnant. Men should not father a child while taking this medicine and for 3 months after stopping it. There is a potential for serious side effects to an unborn child. Talk to your health care professional or pharmacist for more information. Do not breast-feed an infant while taking this  medicine or for at least 1 week after stopping it.  Men should inform their doctors if they wish to father a child. This medicine may lower sperm counts. Talk with your doctor or health care professional if you are concerned about your fertility.  What side effects may I notice from receiving this medicine?  Side effects that you should report to your doctor or health care professional as soon as possible:  -allergic reactions like skin rash, itching or hives, swelling of the face, lips, or tongue  -breathing problems  -pain, redness, or irritation at site where injected  -signs and symptoms of a dangerous change in heartbeat or heart rhythm like chest pain; dizziness; fast or irregular heartbeat; palpitations; feeling   faint or lightheaded, falls; breathing problems  -signs of decreased platelets or bleeding - bruising, pinpoint red spots on the skin, black, tarry stools, blood in the urine  -signs of decreased red blood cells - unusually weak or tired, feeling faint or lightheaded, falls  -signs of infection - fever or chills, cough, sore throat, pain or difficulty passing urine  -signs and symptoms of kidney injury like trouble passing urine or change in the amount of urine  -signs and symptoms of liver injury like dark yellow or brown urine; general ill feeling or flu-like symptoms; light-colored stools; loss of appetite; nausea; right upper belly pain; unusually weak or tired; yellowing of the eyes or skin  -swelling of ankles, feet, hands  Side effects that usually do not require medical attention (report to your doctor or health care professional if they continue or are bothersome):  -constipation  -diarrhea  -hair loss  -loss of appetite  -nausea  -rash  -vomiting  This list may not describe all possible side effects. Call your doctor for medical advice about side effects. You may report side effects to FDA at 1-800-FDA-1088.  Where should I keep my medicine?  This drug is given in a hospital or clinic and  will not be stored at home.  NOTE: This sheet is a summary. It may not cover all possible information. If you have questions about this medicine, talk to your doctor, pharmacist, or health care provider.   2019 Elsevier/Gold Standard (2017-12-15 18:06:11)

## 2018-10-06 NOTE — Telephone Encounter (Signed)
Patient called with recent lab results. New prescription sent to pharmacy. Patient aware.

## 2018-10-06 NOTE — Assessment & Plan Note (Signed)
I have reviewed the PET CT scan with the patient's He has appointment to meet with ENT to discuss the risk and benefits of surgical resection Due to significant muscle involvement and prior surgery and radiation therapy to the neck, I felt that his risk of poor wound healing is very high We discussed the role of chemotherapy which is strictly palliative in nature Due to the holidays, he is in agreement to start cycle 1 day 1 tomorrow in the [facility] office  I have reviewed NCCN guidelines with the patient. The supportive date for this regimen is based on publication from 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  Methods In this multicentre, randomised, open-label, phase 3 trial, patients with recurrent or metastatic nasopharyngeal carcinoma were recruited from 22 hospitals in [place]. Key inclusion criteria were [facility] performance status of 0 or 1, adequate organ function, and measurable lesions according to Response Evaluation Criteria in Solid Tumors version 1.1. Patients were randomly assigned in a 1:1 ratio to receive either gemcitabine (1 g/m2 intravenously on days 1 and 8) and cisplatin (80 mg/m2 intravenously on day 1), or fluorouracil (4 g/m2 in continuous intravenous infusion over 96 h) and cisplatin (80 mg/m2 on day 1 given intravenously) once every 3 weeks for a maximum of six cycles. The randomisation was done  centrally via an interactive phone response system using block randomisation with a size of six. The primary endpoint was progression-free survival assessed by the independent image committee in the intention-to-treat population. Safety analyses were done in patients who received at least one cycle of study drug. This study is ongoing and is registered with [employer], number [id number].  Findings Between Nov 24, 2010, and Aug 03, 2014, 362 patients were randomly assigned to a group (181 to the gemcitabine [plus cisplatin] group and 181 to the fluorouracil [plus cisplatin] group). Median follow-up time for progression-free survival was 194 months (IQR 121-356). The median progression-free survival was 70 months (44-109) in the gemcitabine group and 56 months (30-70) in the fluorouracil group (hazard ratio [HR] 055 [95% CI 044-068]; p<00001). A total of 180 patients in the gemcitabine group and 173 patients in the fluorouracil group were included in the safety analysis. Significantly different treatment-related grade 3 or 4 adverse events between the gemcitabine and fluorouracil groups were leucopenia (52 [29%] vs 15 [9%]; <00001), neutropenia (41 [23%] vs 23 [13%]; p=[id number]), thrombocytopenia (24 [13%] vs three [2%]; p=00007), and mucosal inflammation (0 vs 25 [14%]; <00001). Serious treatment-related adverse events occurred in seven (4%) patients in the gemcitabine group and ten (6%) in the fluorouracil group. Six (3%) patients in the gemcitabine group and 14 (8%) patients in the fluorouracil group discontinued treatment because of drug-related adverse events. No treatment-related deaths occurred in either group.  Interpretation Gemcitabine plus cisplatin prolongs progression-free survival in patients with recurrent or metastatic nasopharyngeal carcinoma. The results establish gemcitabine plus cisplatin as the standard first-line treatment option for this population.  We discussed  the role of chemotherapy. The intent is of palliative intent.  We discussed some of the risks, benefits,  side-effects of cisplatin and gemcitabine  Some of the short term side-effects included, though not limited to, including weight loss, life threatening infections, risk of allergic reactions, need for transfusions of blood products, nausea, vomiting, change in bowel habits, loss of hair, admission to hospital for various reasons, and risks of death.   Long term side-effects are also discussed including risks of infertility, permanent damage to nerve function, hearing loss, chronic fatigue, kidney damage with possibility needing hemodialysis, and rare secondary malignancy including bone marrow disorders.  The patient is aware that the response rates discussed earlier is not guaranteed.  After a long discussion, patient made an informed decision to proceed with the prescribed plan of care.   Patient education material was dispensed.  With this young age, I do not recommend prophylactic G-CSF support for now I will see him on day 8 prior to treatment I have gotten permission from him to take a picture of his skin lesion now for future comparison I recommend minimum 3 cycles of treatment before repeat imaging study

## 2018-10-06 NOTE — Assessment & Plan Note (Signed)
His cancer pain is well controlled with current prescription oxycodone.  We will continue the same

## 2018-10-06 NOTE — Telephone Encounter (Signed)
Gave avs and calendar ° °

## 2018-10-06 NOTE — Assessment & Plan Note (Signed)
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 The patient would like to get a surgical opinion. 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

## 2018-10-07 ENCOUNTER — Encounter: Payer: Self-pay | Admitting: Hematology & Oncology

## 2018-10-07 ENCOUNTER — Inpatient Hospital Stay: Payer: Medicaid Other

## 2018-10-07 ENCOUNTER — Other Ambulatory Visit: Payer: Self-pay

## 2018-10-07 VITALS — BP 125/74 | HR 86 | Temp 98.7°F | Resp 20

## 2018-10-07 DIAGNOSIS — Z5111 Encounter for antineoplastic chemotherapy: Secondary | ICD-10-CM | POA: Diagnosis not present

## 2018-10-07 DIAGNOSIS — C7800 Secondary malignant neoplasm of unspecified lung: Secondary | ICD-10-CM

## 2018-10-07 DIAGNOSIS — Z95828 Presence of other vascular implants and grafts: Secondary | ICD-10-CM

## 2018-10-07 DIAGNOSIS — Z7189 Other specified counseling: Secondary | ICD-10-CM

## 2018-10-07 DIAGNOSIS — C760 Malignant neoplasm of head, face and neck: Secondary | ICD-10-CM

## 2018-10-07 MED ORDER — PROCHLORPERAZINE MALEATE 10 MG PO TABS
10.0000 mg | ORAL_TABLET | Freq: Once | ORAL | Status: AC
  Administered 2018-10-07: 10 mg via ORAL

## 2018-10-07 MED ORDER — SODIUM CHLORIDE 0.9 % IV SOLN
80.0000 mg/m2 | Freq: Once | INTRAVENOUS | Status: AC
  Administered 2018-10-07: 162 mg via INTRAVENOUS
  Filled 2018-10-07: qty 162

## 2018-10-07 MED ORDER — SODIUM CHLORIDE 0.9 % IV SOLN
Freq: Once | INTRAVENOUS | Status: AC
  Administered 2018-10-07: 11:00:00 via INTRAVENOUS
  Filled 2018-10-07: qty 5

## 2018-10-07 MED ORDER — SODIUM CHLORIDE 0.9% FLUSH
10.0000 mL | INTRAVENOUS | Status: DC | PRN
  Administered 2018-10-07: 10 mL
  Filled 2018-10-07: qty 10

## 2018-10-07 MED ORDER — SODIUM CHLORIDE 0.9 % IV SOLN
Freq: Once | INTRAVENOUS | Status: AC
  Administered 2018-10-07: 08:00:00 via INTRAVENOUS
  Filled 2018-10-07: qty 250

## 2018-10-07 MED ORDER — SODIUM CHLORIDE 0.9 % IV SOLN
2000.0000 mg | Freq: Once | INTRAVENOUS | Status: AC
  Administered 2018-10-07: 2000 mg via INTRAVENOUS
  Filled 2018-10-07: qty 52.6

## 2018-10-07 MED ORDER — PALONOSETRON HCL INJECTION 0.25 MG/5ML
0.2500 mg | Freq: Once | INTRAVENOUS | Status: AC
  Administered 2018-10-07: 0.25 mg via INTRAVENOUS

## 2018-10-07 MED ORDER — POTASSIUM CHLORIDE 2 MEQ/ML IV SOLN
Freq: Once | INTRAVENOUS | Status: AC
  Administered 2018-10-07: 09:00:00 via INTRAVENOUS
  Filled 2018-10-07: qty 10

## 2018-10-07 MED ORDER — SODIUM CHLORIDE 0.9 % IV SOLN
Freq: Once | INTRAVENOUS | Status: AC
  Administered 2018-10-07: 11:00:00 via INTRAVENOUS
  Filled 2018-10-07: qty 250

## 2018-10-07 MED ORDER — ANTICOAGULANT SODIUM CITRATE 4% (200MG/5ML) IV SOLN
5.0000 mL | Freq: Once | Status: AC
  Administered 2018-10-07: 5 mL
  Filled 2018-10-07: qty 5

## 2018-10-07 MED ORDER — PALONOSETRON HCL INJECTION 0.25 MG/5ML
INTRAVENOUS | Status: AC
  Filled 2018-10-07: qty 5

## 2018-10-07 MED ORDER — PROCHLORPERAZINE MALEATE 10 MG PO TABS
ORAL_TABLET | ORAL | Status: AC
  Filled 2018-10-07: qty 1

## 2018-10-07 NOTE — Addendum Note (Signed)
Addended by: San Morelle on: 10/07/2018 03:45 PM   Modules accepted: Orders

## 2018-10-07 NOTE — Progress Notes (Signed)
1552 Patient states his nausea has resolved and would like to go home. Chemotherapy discharge instructions reviewed again. Patient discharged ambulatory.

## 2018-10-07 NOTE — Patient Instructions (Signed)
[facility] Discharge Instructions for Patients Receiving Chemotherapy  Today you received the following chemotherapy agents Gemzar/Cisplatin To help prevent nausea and vomiting after your treatment, we encourage you to take your nausea medication as prescribed.  If you develop nausea and vomiting that is not controlled by your nausea medication, call the clinic.   BELOW ARE SYMPTOMS THAT SHOULD BE REPORTED IMMEDIATELY:  *FEVER GREATER THAN 100.5 F  *CHILLS WITH OR WITHOUT FEVER  NAUSEA AND VOMITING THAT IS NOT CONTROLLED WITH YOUR NAUSEA MEDICATION  *UNUSUAL SHORTNESS OF BREATH  *UNUSUAL BRUISING OR BLEEDING  TENDERNESS IN MOUTH AND THROAT WITH OR WITHOUT PRESENCE OF ULCERS  *URINARY PROBLEMS  *BOWEL PROBLEMS  UNUSUAL RASH Items with * indicate a potential emergency and should be followed up as soon as possible.  Feel free to call the clinic should you have any questions or concerns. The clinic phone number is [phone].  Please show the [facility] at check-in to the Emergency Department and triage nurse.  Gemcitabine injection (Gemzar) What is this medicine? GEMCITABINE (jem SYE ta been) is a chemotherapy drug. This medicine is used to treat many types of cancer like breast cancer, lung cancer, pancreatic cancer, and ovarian cancer. This medicine may be used for other purposes; ask your health care provider or pharmacist if you have questions. COMMON BRAND NAME(S): Gemzar, Infugem What should I tell my health care provider before I take this medicine? They need to know if you have any of these conditions: -blood disorders -infection -kidney disease -liver disease -lung or breathing disease, like asthma -recent or ongoing radiation therapy -an unusual or allergic reaction to gemcitabine, other chemotherapy, other medicines, foods, dyes, or preservatives -pregnant or trying to get pregnant -breast-feeding How should I use this medicine? This  drug is given as an infusion into a vein. It is administered in a hospital or clinic by a specially trained health care professional. Talk to your pediatrician regarding the use of this medicine in children. Special care may be needed. Overdosage: If you think you have taken too much of this medicine contact a poison control center or emergency room at once. NOTE: This medicine is only for you. Do not share this medicine with others. What if I miss a dose? It is important not to miss your dose. Call your doctor or health care professional if you are unable to keep an appointment. What may interact with this medicine? -medicines to increase blood counts like filgrastim, pegfilgrastim, sargramostim -some other chemotherapy drugs like cisplatin -vaccines Talk to your doctor or health care professional before taking any of these medicines: -acetaminophen -aspirin -ibuprofen -ketoprofen -naproxen This list may not describe all possible interactions. Give your health care provider a list of all the medicines, herbs, non-prescription drugs, or dietary supplements you use. Also tell them if you smoke, drink alcohol, or use illegal drugs. Some items may interact with your medicine. What should I watch for while using this medicine? Visit your doctor for checks on your progress. This drug may make you feel generally unwell. This is not uncommon, as chemotherapy can affect healthy cells as well as cancer cells. Report any side effects. Continue your course of treatment even though you feel ill unless your doctor tells you to stop. In some cases, you may be given additional medicines to help with side effects. Follow all directions for their use. Call your doctor or health care professional for advice if you get a fever, chills or sore throat, or  other symptoms of a cold or flu. Do not treat yourself. This drug decreases your body's ability to fight infections. Try to avoid being around people who are  sick. This medicine may increase your risk to bruise or bleed. Call your doctor or health care professional if you notice any unusual bleeding. Be careful brushing and flossing your teeth or using a toothpick because you may get an infection or bleed more easily. If you have any dental work done, tell your dentist you are receiving this medicine. Avoid taking products that contain aspirin, acetaminophen, ibuprofen, naproxen, or ketoprofen unless instructed by your doctor. These medicines may hide a fever. Do not become pregnant while taking this medicine or for 6 months after stopping it. Women should inform their doctor if they wish to become pregnant or think they might be pregnant. Men should not father a child while taking this medicine and for 3 months after stopping it. There is a potential for serious side effects to an unborn child. Talk to your health care professional or pharmacist for more information. Do not breast-feed an infant while taking this medicine or for at least 1 week after stopping it. Men should inform their doctors if they wish to father a child. This medicine may lower sperm counts. Talk with your doctor or health care professional if you are concerned about your fertility. What side effects may I notice from receiving this medicine? Side effects that you should report to your doctor or health care professional as soon as possible: -allergic reactions like skin rash, itching or hives, swelling of the face, lips, or tongue -breathing problems -pain, redness, or irritation at site where injected -signs and symptoms of a dangerous change in heartbeat or heart rhythm like chest pain; dizziness; fast or irregular heartbeat; palpitations; feeling faint or lightheaded, falls; breathing problems -signs of decreased platelets or bleeding - bruising, pinpoint red spots on the skin, black, tarry stools, blood in the urine -signs of decreased red blood cells - unusually weak or tired,  feeling faint or lightheaded, falls -signs of infection - fever or chills, cough, sore throat, pain or difficulty passing urine -signs and symptoms of kidney injury like trouble passing urine or change in the amount of urine -signs and symptoms of liver injury like dark yellow or brown urine; general ill feeling or flu-like symptoms; light-colored stools; loss of appetite; nausea; right upper belly pain; unusually weak or tired; yellowing of the eyes or skin -swelling of ankles, feet, hands Side effects that usually do not require medical attention (report to your doctor or health care professional if they continue or are bothersome): -constipation -diarrhea -hair loss -loss of appetite -nausea -rash -vomiting This list may not describe all possible side effects. Call your doctor for medical advice about side effects. You may report side effects to FDA at 1-800-FDA-1088. Where should I keep my medicine? This drug is given in a hospital or clinic and will not be stored at home. NOTE: This sheet is a summary. It may not cover all possible information. If you have questions about this medicine, talk to your doctor, pharmacist, or health care provider.  2019 [employer]/Gold Standard (2017-12-15 18:06:11)  Cisplatin injection What is this medicine? CISPLATIN (SIS pla tin) is a chemotherapy drug. It targets fast dividing cells, like cancer cells, and causes these cells to die. This medicine is used to treat many types of cancer like bladder, ovarian, and testicular cancers. This medicine may be used for other purposes; ask your health  care provider or pharmacist if you have questions. COMMON BRAND NAME(S): Platinol, Platinol -AQ What should I tell my health care provider before I take this medicine? They need to know if you have any of these conditions: -blood disorders -hearing problems -kidney disease -recent or ongoing radiation therapy -an unusual or allergic reaction to cisplatin,  carboplatin, other chemotherapy, other medicines, foods, dyes, or preservatives -pregnant or trying to get pregnant -breast-feeding How should I use this medicine? This drug is given as an infusion into a vein. It is administered in a hospital or clinic by a specially trained health care professional. Talk to your pediatrician regarding the use of this medicine in children. Special care may be needed. Overdosage: If you think you have taken too much of this medicine contact a poison control center or emergency room at once. NOTE: This medicine is only for you. Do not share this medicine with others. What if I miss a dose? It is important not to miss a dose. Call your doctor or health care professional if you are unable to keep an appointment. What may interact with this medicine? -dofetilide -foscarnet -medicines for seizures -medicines to increase blood counts like filgrastim, pegfilgrastim, sargramostim -probenecid -pyridoxine used with altretamine -rituximab -some antibiotics like amikacin, gentamicin, neomycin, polymyxin B, streptomycin, tobramycin -sulfinpyrazone -vaccines -zalcitabine Talk to your doctor or health care professional before taking any of these medicines: -acetaminophen -aspirin -ibuprofen -ketoprofen -naproxen This list may not describe all possible interactions. Give your health care provider a list of all the medicines, herbs, non-prescription drugs, or dietary supplements you use. Also tell them if you smoke, drink alcohol, or use illegal drugs. Some items may interact with your medicine. What should I watch for while using this medicine? Your condition will be monitored carefully while you are receiving this medicine. You will need important blood work done while you are taking this medicine. This drug may make you feel generally unwell. This is not uncommon, as chemotherapy can affect healthy cells as well as cancer cells. Report any side effects. Continue  your course of treatment even though you feel ill unless your doctor tells you to stop. In some cases, you may be given additional medicines to help with side effects. Follow all directions for their use. Call your doctor or health care professional for advice if you get a fever, chills or sore throat, or other symptoms of a cold or flu. Do not treat yourself. This drug decreases your body's ability to fight infections. Try to avoid being around people who are sick. This medicine may increase your risk to bruise or bleed. Call your doctor or health care professional if you notice any unusual bleeding. Be careful brushing and flossing your teeth or using a toothpick because you may get an infection or bleed more easily. If you have any dental work done, tell your dentist you are receiving this medicine. Avoid taking products that contain aspirin, acetaminophen, ibuprofen, naproxen, or ketoprofen unless instructed by your doctor. These medicines may hide a fever. Do not become pregnant while taking this medicine. Women should inform their doctor if they wish to become pregnant or think they might be pregnant. There is a potential for serious side effects to an unborn child. Talk to your health care professional or pharmacist for more information. Do not breast-feed an infant while taking this medicine. Drink fluids as directed while you are taking this medicine. This will help protect your kidneys. Call your doctor or health care professional if you  get diarrhea. Do not treat yourself. What side effects may I notice from receiving this medicine? Side effects that you should report to your doctor or health care professional as soon as possible: -allergic reactions like skin rash, itching or hives, swelling of the face, lips, or tongue -signs of infection - fever or chills, cough, sore throat, pain or difficulty passing urine -signs of decreased platelets or bleeding - bruising, pinpoint red spots on the  skin, black, tarry stools, nosebleeds -signs of decreased red blood cells - unusually weak or tired, fainting spells, lightheadedness -breathing problems -changes in hearing -gout pain -low blood counts - This drug may decrease the number of white blood cells, red blood cells and platelets. You may be at increased risk for infections and bleeding. -nausea and vomiting -pain, swelling, redness or irritation at the injection site -pain, tingling, numbness in the hands or feet -problems with balance, movement -trouble passing urine or change in the amount of urine Side effects that usually do not require medical attention (report to your doctor or health care professional if they continue or are bothersome): -changes in vision -loss of appetite -metallic taste in the mouth or changes in taste This list may not describe all possible side effects. Call your doctor for medical advice about side effects. You may report side effects to FDA at 1-800-FDA-1088. Where should I keep my medicine? This drug is given in a hospital or clinic and will not be stored at home. NOTE: This sheet is a summary. It may not cover all possible information. If you have questions about this medicine, talk to your doctor, pharmacist, or health care provider.  2019 [employer]vier/Gold Standard (2007-12-27 14:40:54)

## 2018-10-07 NOTE — Progress Notes (Signed)
After receiving chemotherapy and port de accessed patient went to bathroom. On return from the bathroom patient complained of feeling nauseous. Dr. Maylon Peppers notified. Order given and carried out for compazine 10 mg PO. Also, patient given ginger ale.

## 2018-10-09 ENCOUNTER — Other Ambulatory Visit: Payer: Self-pay

## 2018-10-09 ENCOUNTER — Emergency Department (HOSPITAL_COMMUNITY): Payer: Medicaid Other

## 2018-10-09 ENCOUNTER — Encounter (HOSPITAL_COMMUNITY): Payer: Self-pay | Admitting: Nurse Practitioner

## 2018-10-09 ENCOUNTER — Emergency Department (HOSPITAL_COMMUNITY)
Admission: EM | Admit: 2018-10-09 | Discharge: 2018-10-09 | Disposition: A | Discharge status: Home / Self Care | Payer: Medicaid Other | Attending: Emergency Medicine | Admitting: Emergency Medicine

## 2018-10-09 DIAGNOSIS — E039 Hypothyroidism, unspecified: Secondary | ICD-10-CM | POA: Insufficient documentation

## 2018-10-09 DIAGNOSIS — T50905A Adverse effect of unspecified drugs, medicaments and biological substances, initial encounter: Secondary | ICD-10-CM | POA: Diagnosis not present

## 2018-10-09 DIAGNOSIS — T887XXA Unspecified adverse effect of drug or medicament, initial encounter: Secondary | ICD-10-CM | POA: Diagnosis present

## 2018-10-09 DIAGNOSIS — Y658 Other specified misadventures during surgical and medical care: Secondary | ICD-10-CM | POA: Insufficient documentation

## 2018-10-09 DIAGNOSIS — R739 Hyperglycemia, unspecified: Secondary | ICD-10-CM

## 2018-10-09 LAB — COMPREHENSIVE METABOLIC PANEL
ALT: 56 U/L — AB (ref 0–44)
AST: 32 U/L (ref 15–41)
Albumin: 4.2 g/dL (ref 3.5–5.0)
Alkaline Phosphatase: 48 U/L (ref 38–126)
Anion gap: 11 (ref 5–15)
BUN: 22 mg/dL — ABNORMAL HIGH (ref 6–20)
CO2: 27 mmol/L (ref 22–32)
Calcium: 8.7 mg/dL — ABNORMAL LOW (ref 8.9–10.3)
Chloride: 101 mmol/L (ref 98–111)
Creatinine, Ser: 0.93 mg/dL (ref 0.61–1.24)
GFR calc Af Amer: >60 mL/min (ref >60)
GFR calc non Af Amer: >60 mL/min (ref >60)
Glucose, Bld: 243 mg/dL — ABNORMAL HIGH (ref 70–99)
Potassium: 3.6 mmol/L (ref 3.5–5.1)
Sodium: 139 mmol/L (ref 135–145)
Total Bilirubin: 0.4 mg/dL (ref 0.3–1.2)
Total Protein: 6.9 g/dL (ref 6.5–8.1)

## 2018-10-09 LAB — CBC
HEMATOCRIT: 38.1 % — AB (ref 39.0–52.0)
Hemoglobin: 12.4 g/dL — ABNORMAL LOW (ref 13.0–17.0)
MCH: 30 pg (ref 26.0–34.0)
MCHC: 32.5 g/dL (ref 30.0–36.0)
MCV: 92.3 fL (ref 80.0–100.0)
Platelets: 219 10*3/uL (ref 150–400)
RBC: 4.13 MIL/uL — ABNORMAL LOW (ref 4.22–5.81)
RDW: 13.5 % (ref 11.5–15.5)
WBC: 14.4 10*3/uL — ABNORMAL HIGH (ref 4.0–10.5)
nRBC: 0 % (ref 0.0–0.2)

## 2018-10-09 LAB — URINALYSIS, ROUTINE W REFLEX MICROSCOPIC
Bacteria, UA: NONE SEEN
Bilirubin Urine: NEGATIVE
Hgb urine dipstick: NEGATIVE
Ketones, ur: NEGATIVE mg/dL
Leukocytes, UA: NEGATIVE
Nitrite: NEGATIVE
Protein, ur: NEGATIVE mg/dL
Specific Gravity, Urine: 1.028 (ref 1.005–1.030)
pH: 6 (ref 5.0–8.0)

## 2018-10-09 LAB — I-STAT TROPONIN, ED: Troponin i, poc: 0 ng/mL (ref 0.00–0.08)

## 2018-10-09 LAB — CBG MONITORING, ED: Glucose-Capillary: 231 mg/dL — ABNORMAL HIGH (ref 70–99)

## 2018-10-09 MED ORDER — SODIUM CHLORIDE 0.9 % IV BOLUS
1000.0000 mL | Freq: Once | INTRAVENOUS | Status: AC
  Administered 2018-10-09: 1000 mL via INTRAVENOUS

## 2018-10-09 MED ORDER — OXYCODONE HCL 5 MG PO TABS
15.0000 mg | ORAL_TABLET | Freq: Once | ORAL | Status: AC
  Administered 2018-10-09: 15 mg via ORAL
  Filled 2018-10-09: qty 3

## 2018-10-09 MED ORDER — HYDROXYZINE HCL 25 MG PO TABS
25.0000 mg | ORAL_TABLET | Freq: Once | ORAL | Status: AC
  Administered 2018-10-09: 25 mg via ORAL
  Filled 2018-10-09: qty 1

## 2018-10-09 MED ORDER — ONDANSETRON HCL 4 MG/2ML IJ SOLN
4.0000 mg | Freq: Once | INTRAMUSCULAR | Status: AC
  Administered 2018-10-09: 4 mg via INTRAVENOUS
  Filled 2018-10-09: qty 2

## 2018-10-09 MED ORDER — HYDROXYZINE HCL 25 MG PO TABS: 25.0000 mg | ORAL_TABLET | Freq: Four times a day (QID) | ORAL | 0 refills | Status: DC

## 2018-10-09 NOTE — Discharge Instructions (Addendum)
Thank you for allowing me to care for you today in the Emergency Department.   Please follow-up with oncology regarding your home Decadron use.  Let them know how the medication impacted you today. I also sent Dr. Alvy Bimler a message to let her know about your ER visit today.   You can try taking 1 tablet of Vistaril when you take the Decadron at home to see if this helps with anxiety.  You may take 1 tablet every 6 hours as needed for anxiety.  Return to the emergency department if you develop significant shortness of breath, rash, chest pain, difficulty swallowing, new numbness or weakness, or other new, concerning symptoms.

## 2018-10-09 NOTE — ED Triage Notes (Signed)
Pt states he feels severely anxious. He attributes it to the strong steroid medication (dexamethasone) and antiemetic (prochlorperazine) that is prescribed to help curb the chemo therapy side effects. He does seem anxious and is passing in the room albeit apologizing multiple times as he tries calm himself. He states he has never experienced this before.

## 2018-10-09 NOTE — ED Provider Notes (Signed)
[facility] DEPT Provider Note   CSN: [id number] Arrival date & time: 10/09/18  0027     History   Chief Complaint Chief Complaint  Patient presents with  . CA Pt  . Medication Reaction    HPI Christian Simmons is a 33 y.o. male with a history of metastatic nasopharyngeal carcinoma, hypothyroidism, and seizures who presents to the emergency department with a chief complaint of anxiety.  The patient states that he had chemotherapy yesterday and was told to take dexamethasone tablets for 2 days after chemotherapy.  He states he took the first tablet this morning at 8 AM and developed a sudden onset anxiety at 10 AM.  States the anxiety is persisted all day.  He has taken 2 tablets of dexamethasone today and is due for 1/3 tablet tonight.  He reports that he has tolerated Decadron IV previously, but has never had the tablets.  He reports that he has been feeling nauseated. No known aggravating or alleviating factors.   He denies chest pain, shortness of breath, fever, chills, vomiting, diarrhea, abdominal pain, numbness, weakness, or palpitations.  No treatment prior to arrival.  The history is provided by the patient. No language interpreter was used.    Past Medical History:  Diagnosis Date  . Arrhythmia 12/25/2015  . Carcinoma ([place])   . Fatigue 06/01/2014  . Hypothyroidism   . Infection of eyelash follicle of left eye 03/02/4131  . 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 Nasopharyngeal cancer ([place]) 06/01/2014  . Neuropathy   . Radiation 07/18/14-07/31/14   Left upper lobe  40 gy in 10 fractions  . 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 Goals of care, counseling/discussion 09/21/2018  . Cancer associated pain 09/08/2018  . Financial difficulties 11/26/2017  . Viral URI with cough 10/22/2017  . Medically noncompliant 04/13/2017  . Vertigo, intermittent 11/02/2016  . Port catheter in place  04/01/2016  . Epigastric pain 02/10/2016  . Gastritis without bleeding 01/22/2016  . Arrhythmia 12/25/2015  . Infection of eyelash follicle of left eye 44/10/270  . Poor dentition 10/16/2015  . Other constipation 05/16/2015  . Neck pain on right side 01/28/2015  . 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 Acquired hypothyroidism 06/09/2014  . Primary cancer of head and neck ([place]) 06/01/2014  . 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 Chronic fatigue 06/01/2014    Past Surgical History:  Procedure Laterality Date  . LUNG REMOVAL, PARTIAL  05/03/2012   left upper lobectomy  . nasal biopsy    . RADICAL NECK DISSECTION    . VIDEO BRONCHOSCOPY N/A 06/29/2014   Procedure: VIDEO BRONCHOSCOPY ;  Surgeon: Melrose Nakayama, MD;  Location: [facility];  Service: Thoracic;  Laterality: N/A;        Home Medications    Prior to Admission medications   Medication Sig Start Date End Date Taking? Authorizing Provider  dexamethasone (DECADRON) 4 MG tablet Take 2 tablets by mouth after cisplatin chemotherapy for 2 days in the morning. Take with food. 10/06/18  Yes Gorsuch, Ni, MD  diclofenac (VOLTAREN) 50 MG EC tablet TAKE 1 TABLET BY MOUTH THREE TIMES DAILY AS NEEDED FOR MILD PAIN Patient taking differently: Take 50 mg by mouth 3 (three) times daily as needed for mild pain or moderate pain.  08/22/18  Yes Nicholas Lose, MD  levothyroxine (SYNTHROID, LEVOTHROID) 137 MCG tablet TAKE 1 TABLET(137 MCG)  BY MOUTH DAILY BEFORE BREAKFAST Patient taking differently: Take 137 mcg by mouth daily before breakfast.  10/06/18  Yes Gorsuch, Ni, MD  lidocaine-prilocaine (EMLA) cream Apply 1 application topically as needed. Patient taking differently: Apply 1 application topically as needed (for port access).  10/08/16  Yes Gorsuch, Ni, MD  LORazepam (ATIVAN) 0.5 MG tablet Take 1 tablet (0.5 mg total) by mouth 3 times/day as needed-between meals & bedtime for anxiety. 07/28/18  Yes Tanner, Lyndon Code., PA-C    ondansetron (ZOFRAN) 8 MG tablet Take 1 tablet (8 mg total) by mouth every 8 (eight) hours as needed. Start on the third day after cisplatin chemotherapy. Patient taking differently: Take 8 mg by mouth every 8 (eight) hours as needed for nausea or vomiting. Start on the third day after cisplatin chemotherapy. 10/06/18  Yes Gorsuch, Ni, MD  oxyCODONE (ROXICODONE) 15 MG immediate release tablet Take 1 tablet (15 mg total) by mouth every 4 (four) hours as needed for pain. 09/26/18  Yes Gorsuch, Ni, MD  polyethylene glycol (MIRALAX) packet Take 17 g by mouth daily. Patient taking differently: Take 17 g by mouth daily as needed for mild constipation or moderate constipation.  03/17/18  Yes [name], MD  prochlorperazine (COMPAZINE) 10 MG tablet Take 1 tablet (10 mg total) by mouth every 6 (six) hours as needed (Nausea or vomiting). 10/06/18  Yes [name], MD  hydrOXYzine (ATARAX/VISTARIL) 25 MG tablet Take 1 tablet (25 mg total) by mouth every 6 (six) hours. 10/09/18   Dashanna Kinnamon A, PA-C    Family History Family History  Problem Relation Age of Onset  . Hypertension Mother   . Diabetes Mother   . Hypertension Father   . Diabetes Father     Social History Social History   Tobacco Use  . Smoking status: Never Smoker  . Smokeless tobacco: Never Used  Substance Use Topics  . Alcohol use: No  . Drug use: No     Allergies   Heparin and Clindamycin   Review of Systems Review of Systems  Constitutional: Negative for appetite change and fever.  Respiratory: Negative for cough, shortness of breath and wheezing.   Cardiovascular: Negative for chest pain, palpitations and leg swelling.  Gastrointestinal: Positive for nausea. Negative for abdominal pain, rectal pain and vomiting.  Genitourinary: Negative for dysuria.  Musculoskeletal: Negative for back pain.  Skin: Negative for rash.  Allergic/Immunologic: Negative for immunocompromised state.  Neurological: Negative for seizures,  syncope, weakness and headaches.  Psychiatric/Behavioral: Negative for confusion. The patient is nervous/anxious.      Physical Exam Updated Vital Signs BP (!) 151/94 (BP Location: Left Arm)   Pulse (!) 101   Temp 98.1 F (36.7 C) (Oral)   Resp 20   Ht 5\' 9"  (1.753 m)   Wt 83.9 kg   SpO2 98%   BMI 27.32 kg/m   Physical Exam Vitals signs and nursing note reviewed.  Constitutional:      Appearance: He is well-developed.  HENT:     Head: Normocephalic.  Eyes:     General: No scleral icterus.       Right eye: No discharge.        Left eye: No discharge.     Conjunctiva/sclera: Conjunctivae normal.  Neck:     Musculoskeletal: Normal range of motion and neck supple.     Comments: Mass noted to the right neck. Cardiovascular:     Rate and Rhythm: Normal rate and regular rhythm.     Heart sounds:  No murmur.  Pulmonary:     Effort: Pulmonary effort is normal. No respiratory distress.     Breath sounds: Normal breath sounds. No stridor. No wheezing, rhonchi or rales.  Chest:     Chest wall: No tenderness.  Abdominal:     General: There is no distension.     Palpations: Abdomen is soft. There is no mass.     Tenderness: There is no abdominal tenderness. There is no right CVA tenderness, left CVA tenderness, guarding or rebound.     Hernia: No hernia is present.  Skin:    General: Skin is warm and dry.  Neurological:     Mental Status: He is alert.     Comments: Pacing around the room.  Appears anxious.  Psychiatric:        Behavior: Behavior normal.      ED Treatments / Results  Labs (all labs ordered are listed, but only abnormal results are displayed) Labs Reviewed  URINALYSIS, ROUTINE W REFLEX MICROSCOPIC - Abnormal; Notable for the following components:      Result Value   Glucose, UA >=500 (*)    All other components within normal limits  CBC - Abnormal; Notable for the following components:   WBC 14.4 (*)    RBC 4.13 (*)    Hemoglobin 12.4 (*)    HCT  38.1 (*)    All other components within normal limits  COMPREHENSIVE METABOLIC PANEL - Abnormal; Notable for the following components:   Glucose, Bld 243 (*)    BUN 22 (*)    Calcium 8.7 (*)    ALT 56 (*)    All other components within normal limits  CBG MONITORING, ED - Abnormal; Notable for the following components:   Glucose-Capillary 231 (*)    All other components within normal limits  I-STAT TROPONIN, ED    EKG EKG Interpretation  Date/Time:  Sunday October 09 2018 02:52:04 EST Ventricular Rate:  92 PR Interval:    QRS Duration: 91 QT Interval:  361 QTC Calculation: 447 R Axis:   88 Text Interpretation:  Sinus rhythm Consider left atrial enlargement ST elev, probable normal early repol pattern No significant change was found Confirmed by Ezequiel Essex [phone]) on 10/09/2018 2:59:46 AM   Radiology Dg Chest 2 View  Result Date: 10/09/2018 CLINICAL DATA:  Chest pain. Anxiety. 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 EXAM: CHEST - 2 VIEW COMPARISON:  PET-CT 10/04/2018 FINDINGS: Right chest port with tip at the atrial caval junction. Heart is normal in size. Normal mediastinal contours. Left upper lobe scarring. No acute airspace disease. No pleural effusion or pneumothorax. No pulmonary edema. Small pulmonary nodule on CT is not seen radiographically. No acute osseous abnormalities are seen. IMPRESSION: No acute chest findings.  Left upper lobe scarring. Electronically Signed   By: Keith Rake M.D.   On: 10/09/2018 02:43    Procedures Procedures (including critical care time)  Medications Ordered in ED Medications  sodium chloride 0.9 % bolus 1,000 mL (0 mLs Intravenous Stopped 10/09/18 0343)  ondansetron (ZOFRAN) injection 4 mg (4 mg Intravenous Given 10/09/18 0237)  hydrOXYzine (ATARAX/VISTARIL) tablet 25 mg (25 mg Oral Given 10/09/18 0342)  oxyCODONE (Oxy IR/ROXICODONE) immediate release tablet 15 mg (15 mg Oral Given 10/09/18 0432)     Initial Impression /  Assessment and Plan / ED Course  I have reviewed the triage vital signs and the nursing notes.  Pertinent labs & imaging results that were available during my care of the  patient were reviewed by me and considered in my medical decision making (see chart for details).     33 year old male with a history of metastatic nasopharyngeal carcinoma, hypothyroidism, and seizures presents to the emergency department with a chief complaint of feeling anxious.  States the symptoms began approximately 2 hours after taking a tablet of Decadron orally.  States he has not taken Decadron orally previously only IV or IM.  He is not actively having any chest pain or shortness of breath at this time.  He is feeling nauseated, but no abdominal pain or vomiting.  Will order basic labs and reassess.  Glucose is 243 with normal bicarb and anion gap.  Will treat with IV fluids.  He has been given Zofran for nausea.  Troponin is negative.  Chest x-ray is reassuring.  EKG unchanged from previous.  Although the patient has a history of active cancer, low suspicion for PE as the etiology of the patient's anxiousness.  Symptoms are more likely associated with recent oral Decadron administration.  He was also given Vistaril.  On patient reevaluation, he is resting comfortably.  I had ordered a recheck of his CBG, but RN informs me that she had already given the patient orange juice and snacks prior to rechecking CBG, which was 231.  I recommend the patient follow-up with his oncologist regarding symptoms after taking Decadron today.  Low suspicion for ACS, pericarditis, myocarditis, pneumonia at this time.  Strict return precautions given.  He is hemodynamically stable and in no acute distress.  He is safe for discharge home with outpatient follow-up at this time.  Final Clinical Impressions(s) / ED Diagnoses   Final diagnoses:  Medication side effect, initial encounter  Hyperglycemia    ED Discharge Orders         Ordered     hydrOXYzine (ATARAX/VISTARIL) 25 MG tablet  Every 6 hours     10/09/18 0533           Joanne Gavel, PA-C 10/09/18 Shawnie Dapper, MD 10/10/18 2346    Ezequiel Essex, MD 10/10/18 2347

## 2018-10-09 NOTE — ED Notes (Signed)
Patient given graham crackers and gingerale

## 2018-10-10 ENCOUNTER — Encounter (HOSPITAL_COMMUNITY): Payer: Self-pay | Admitting: Emergency Medicine

## 2018-10-10 ENCOUNTER — Telehealth: Payer: Self-pay

## 2018-10-10 ENCOUNTER — Emergency Department (HOSPITAL_COMMUNITY)
Admission: EM | Admit: 2018-10-10 | Discharge: 2018-10-10 | Disposition: A | Discharge status: Home / Self Care | Payer: Medicaid Other | Attending: Emergency Medicine | Admitting: Emergency Medicine

## 2018-10-10 ENCOUNTER — Other Ambulatory Visit: Payer: Self-pay

## 2018-10-10 DIAGNOSIS — E039 Hypothyroidism, unspecified: Secondary | ICD-10-CM | POA: Diagnosis not present

## 2018-10-10 DIAGNOSIS — B370 Candidal stomatitis: Secondary | ICD-10-CM

## 2018-10-10 DIAGNOSIS — R531 Weakness: Secondary | ICD-10-CM

## 2018-10-10 DIAGNOSIS — R112 Nausea with vomiting, unspecified: Secondary | ICD-10-CM

## 2018-10-10 LAB — CBC
HCT: 41.4 % (ref 39.0–52.0)
Hemoglobin: 13.6 g/dL (ref 13.0–17.0)
MCH: 29.6 pg (ref 26.0–34.0)
MCHC: 32.9 g/dL (ref 30.0–36.0)
MCV: 90 fL (ref 80.0–100.0)
Platelets: 204 10*3/uL (ref 150–400)
RBC: 4.6 MIL/uL (ref 4.22–5.81)
RDW: 13.2 % (ref 11.5–15.5)
WBC: 7.8 10*3/uL (ref 4.0–10.5)
nRBC: 0 % (ref 0.0–0.2)

## 2018-10-10 LAB — CBG MONITORING, ED: Glucose-Capillary: 126 mg/dL — ABNORMAL HIGH (ref 70–99)

## 2018-10-10 LAB — HEPATIC FUNCTION PANEL
ALT: 60 U/L — ABNORMAL HIGH (ref 0–44)
AST: 34 U/L (ref 15–41)
Albumin: 4.2 g/dL (ref 3.5–5.0)
Alkaline Phosphatase: 48 U/L (ref 38–126)
BILIRUBIN INDIRECT: 0.5 mg/dL (ref 0.3–0.9)
BILIRUBIN TOTAL: 0.6 mg/dL (ref 0.3–1.2)
Bilirubin, Direct: 0.1 mg/dL (ref 0.0–0.2)
Total Protein: 7 g/dL (ref 6.5–8.1)

## 2018-10-10 LAB — URINALYSIS, ROUTINE W REFLEX MICROSCOPIC
Bacteria, UA: NONE SEEN
Bilirubin Urine: NEGATIVE
Glucose, UA: 50 mg/dL — AB
Ketones, ur: NEGATIVE mg/dL
Leukocytes, UA: NEGATIVE
Nitrite: NEGATIVE
Protein, ur: NEGATIVE mg/dL
Specific Gravity, Urine: 1.012 (ref 1.005–1.030)
pH: 6 (ref 5.0–8.0)

## 2018-10-10 LAB — BASIC METABOLIC PANEL
Anion gap: 9 (ref 5–15)
BUN: 22 mg/dL — ABNORMAL HIGH (ref 6–20)
CO2: 29 mmol/L (ref 22–32)
Calcium: 8.9 mg/dL (ref 8.9–10.3)
Chloride: 96 mmol/L — ABNORMAL LOW (ref 98–111)
Creatinine, Ser: 0.89 mg/dL (ref 0.61–1.24)
GFR calc Af Amer: >60 mL/min (ref >60)
GFR calc non Af Amer: >60 mL/min (ref >60)
Glucose, Bld: 137 mg/dL — ABNORMAL HIGH (ref 70–99)
Potassium: 3.4 mmol/L — ABNORMAL LOW (ref 3.5–5.1)
Sodium: 134 mmol/L — ABNORMAL LOW (ref 135–145)

## 2018-10-10 LAB — LIPASE, BLOOD: Lipase: 41 U/L (ref 11–51)

## 2018-10-10 MED ORDER — ONDANSETRON HCL 4 MG/2ML IJ SOLN
4.0000 mg | Freq: Once | INTRAMUSCULAR | Status: AC
  Administered 2018-10-10: 4 mg via INTRAVENOUS
  Filled 2018-10-10: qty 2

## 2018-10-10 MED ORDER — SODIUM CHLORIDE 0.9 % IV BOLUS
1000.0000 mL | Freq: Once | INTRAVENOUS | Status: AC
  Administered 2018-10-10: 1000 mL via INTRAVENOUS

## 2018-10-10 MED ORDER — PROMETHAZINE HCL 25 MG/ML IJ SOLN
12.5000 mg | Freq: Once | INTRAMUSCULAR | Status: AC
  Administered 2018-10-10: 12.5 mg via INTRAVENOUS
  Filled 2018-10-10: qty 1

## 2018-10-10 MED ORDER — ONDANSETRON HCL 4 MG PO TABS: 4.0000 mg | ORAL_TABLET | Freq: Three times a day (TID) | ORAL | 0 refills | Status: DC | PRN

## 2018-10-10 MED ORDER — FAMOTIDINE IN NACL 20-0.9 MG/50ML-% IV SOLN
20.0000 mg | Freq: Once | INTRAVENOUS | Status: AC
  Administered 2018-10-10: 20 mg via INTRAVENOUS
  Filled 2018-10-10: qty 50

## 2018-10-10 MED ORDER — NYSTATIN 100000 UNIT/ML MT SUSP: 500000.0000 [IU] | Freq: Four times a day (QID) | OROMUCOSAL | 0 refills | Status: DC

## 2018-10-10 MED ORDER — POTASSIUM CHLORIDE CRYS ER 20 MEQ PO TBCR
40.0000 meq | EXTENDED_RELEASE_TABLET | Freq: Once | ORAL | Status: AC
  Administered 2018-10-10: 40 meq via ORAL
  Filled 2018-10-10: qty 2

## 2018-10-10 MED ORDER — MAGIC MOUTHWASH
5.0000 mL | Freq: Once | ORAL | Status: AC
  Administered 2018-10-10: 5 mL via ORAL
  Filled 2018-10-10: qty 5

## 2018-10-10 MED ORDER — HEPARIN SOD (PORK) LOCK FLUSH 100 UNIT/ML IV SOLN
500.0000 [IU] | Freq: Once | INTRAVENOUS | Status: AC
  Administered 2018-10-10: 500 [IU]
  Filled 2018-10-10: qty 5

## 2018-10-10 MED ORDER — LORAZEPAM 2 MG/ML IJ SOLN
0.5000 mg | Freq: Once | INTRAMUSCULAR | Status: AC
  Administered 2018-10-10: 0.5 mg via INTRAVENOUS
  Filled 2018-10-10: qty 1

## 2018-10-10 MED ORDER — OXYCODONE-ACETAMINOPHEN 5-325 MG PO TABS
1.0000 | ORAL_TABLET | Freq: Once | ORAL | Status: AC
  Administered 2018-10-10: 1 via ORAL
  Filled 2018-10-10: qty 1

## 2018-10-10 MED ORDER — LORAZEPAM 0.5 MG PO TABS: 1.0000 mg | ORAL_TABLET | Freq: Three times a day (TID) | ORAL | 0 refills | Status: DC | PRN

## 2018-10-10 NOTE — ED Notes (Signed)
Patient requesting blood draw from port.

## 2018-10-10 NOTE — Telephone Encounter (Signed)
-----   Message from [name], MD sent at 10/10/2018  9:46 AM EST ----- Regarding: how is he doing Can you call him? L:ooks like he went to ER

## 2018-10-10 NOTE — ED Notes (Signed)
Bed: WA07 Expected date:  Expected time:  Means of arrival:  Comments: T 3

## 2018-10-10 NOTE — ED Provider Notes (Signed)
[facility] DEPT Provider Note   CSN: [id number] Arrival date & time: 10/10/18  1401     History   Chief Complaint Chief Complaint  Patient presents with  . Weakness    HPI Christian Simmons is a 33 y.o. male.  HPI   Patient is a 33 year old male with a history of nasopharyngeal carcinoma with lung mets, hypothyroidism and seizures who presents the emergency department today for evaluation of generalized weakness that began about 3 days ago after receiving chemotherapy. States this was his first chemotherapy treatment in 6 years. He has felt this way previously when receiving chemotherapy treatments however he states he does not live with anyone anymore so does not have help at home when he feels this weak. He reports nausea, vomiting, hunger. He denies sweats, chills, fevers. Reports burning sensation and pain to his abd from hunger. No CP or SOB. Denies diarrhea. States he has some urinary hesitancy. Denies dysuria or hematuria. Reports constipation, however was able to have a BM yesterday. States because of his weakness he had a fall today onto his right side. Has abrasion to right elbow but denies pain. Denies head trauma or loc.  Of note, pt seen in the ED yesterday for anxiety. At that time noted to have slightly elevated WBC at 14, mild anemia, elevated glucose at 243, elevated BUN at 22, and slightly elevated ALT. No elevated anion gap. No ketones in the urine. Troponin was negative. EKG showed NSR, LAE, early repol. CXR with left upper lobe scarring but no acute findings. He was discharged home with rx for atarax. He has filled the rx for atarax from yesterday and tried taking it yesterday with no relief. He states he is still feeling very anxious.  Oncologist: Dr. Alvy Bimler  Past Medical History:  Diagnosis Date  . Arrhythmia 12/25/2015  . Carcinoma ([place])   . Fatigue 06/01/2014  . Hypothyroidism   . Infection of eyelash follicle of left eye 9/56/2130   . Metastasis to lung ([place]) 06/01/2014  . Nasopharyngeal cancer ([place]) 06/01/2014  . Neuropathy   . Radiation 07/18/14-07/31/14   Left upper lobe  40 gy in 10 fractions  . 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 Goals of care, counseling/discussion 09/21/2018  . Cancer associated pain 09/08/2018  . Financial difficulties 11/26/2017  . Viral URI with cough 10/22/2017  . Medically noncompliant 04/13/2017  . Vertigo, intermittent 11/02/2016  . Port catheter in place 04/01/2016  . Epigastric pain 02/10/2016  . Gastritis without bleeding 01/22/2016  . Arrhythmia 12/25/2015  . Infection of eyelash follicle of left eye 86/57/8469  . Poor dentition 10/16/2015  . Other constipation 05/16/2015  . Neck pain on right side 01/28/2015  . 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 Acquired hypothyroidism 06/09/2014  . 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 Metastasis to lung ([place]) 06/01/2014  . Chronic fatigue 06/01/2014    Past Surgical History:  Procedure Laterality Date  . LUNG REMOVAL, PARTIAL  05/03/2012   left upper lobectomy  . nasal biopsy    . RADICAL NECK DISSECTION    . VIDEO BRONCHOSCOPY N/A 06/29/2014   Procedure: VIDEO BRONCHOSCOPY ;  Surgeon: Melrose Nakayama, MD;  Location: [facility];  Service: Thoracic;  Laterality: N/A;        Home Medications    Prior to Admission medications   Medication Sig Start Date  End Date Taking? Authorizing Provider  dexamethasone (DECADRON) 4 MG tablet Take 2 tablets by mouth after cisplatin chemotherapy for 2 days in the morning. Take with food. 10/06/18  Yes Gorsuch, Ni, MD  levothyroxine (SYNTHROID, LEVOTHROID) 137 MCG tablet TAKE 1 TABLET(137 MCG) BY MOUTH DAILY BEFORE BREAKFAST Patient taking differently: Take 137 mcg by mouth daily before breakfast.  10/06/18  Yes Gorsuch, Ni, MD  oxyCODONE (ROXICODONE) 15 MG immediate release tablet Take 1 tablet (15 mg  total) by mouth every 4 (four) hours as needed for pain. 09/26/18  Yes Gorsuch, Ni, MD  polyethylene glycol (MIRALAX) packet Take 17 g by mouth daily. Patient taking differently: Take 17 g by mouth daily as needed for mild constipation or moderate constipation.  03/17/18  Yes [name], MD  prochlorperazine (COMPAZINE) 10 MG tablet Take 1 tablet (10 mg total) by mouth every 6 (six) hours as needed (Nausea or vomiting). 10/06/18  Yes Gorsuch, Ni, MD  diclofenac (VOLTAREN) 50 MG EC tablet TAKE 1 TABLET BY MOUTH THREE TIMES DAILY AS NEEDED FOR MILD PAIN Patient not taking: No sig reported 08/22/18   Nicholas Lose, MD  hydrOXYzine (ATARAX/VISTARIL) 25 MG tablet Take 1 tablet (25 mg total) by mouth every 6 (six) hours. 10/09/18   McDonald, Mia A, PA-C  lidocaine-prilocaine (EMLA) cream Apply 1 application topically as needed. Patient not taking: Reported on 10/10/2018 10/08/16   [name], MD  LORazepam (ATIVAN) 0.5 MG tablet Take 2 tablets (1 mg total) by mouth 3 (three) times daily as needed for up to 3 doses for anxiety. 10/10/18   Kalan Rinn S, PA-C  nystatin (MYCOSTATIN) 100000 UNIT/ML suspension Take 5 mLs (500,000 Units total) by mouth 4 (four) times daily. Take 4 to 6 mL (400,000 to 600,000 units) orally 4 times daily for 7 to 14 days. 10/10/18   Johnnetta Holstine S, PA-C  ondansetron (ZOFRAN) 4 MG tablet Take 1 tablet (4 mg total) by mouth every 8 (eight) hours as needed for nausea or vomiting. 10/10/18   Graceson Nichelson S, PA-C    Family History Family History  Problem Relation Age of Onset  . Hypertension Mother   . Diabetes Mother   . Hypertension Father   . Diabetes Father     Social History Social History   Tobacco Use  . Smoking status: Never Smoker  . Smokeless tobacco: Never Used  Substance Use Topics  . Alcohol use: No  . Drug use: No     Allergies   Heparin and Clindamycin   Review of Systems Review of Systems  Constitutional: Negative for chills and fever.  HENT:  Negative for ear pain and sore throat.   Eyes: Negative for visual disturbance.  Respiratory: Negative for cough and shortness of breath.   Cardiovascular: Negative for chest pain.  Gastrointestinal: Positive for abdominal pain, constipation, nausea and vomiting. Negative for blood in stool and diarrhea.  Genitourinary: Negative for dysuria and hematuria.  Musculoskeletal: Negative for back pain.  Skin: Positive for wound.  Neurological: Positive for weakness (generalized). Negative for dizziness and numbness.  All other systems reviewed and are negative.    Physical Exam Updated Vital Signs BP 133/82 (BP Location: Right Arm)   Pulse 78   Temp 98.6 F (37 C) (Oral)   Resp 16   SpO2 100%   Physical Exam Vitals signs and nursing note reviewed.  Constitutional:      Appearance: He is well-developed. He is not toxic-appearing.     Comments: Appears uncomfortable, dry heaving  HENT:     Head: Normocephalic and atraumatic.     Nose: Nose normal.     Mouth/Throat:     Mouth: Mucous membranes are dry.     Comments: Patient appears to have oral candidiasis Eyes:     Conjunctiva/sclera: Conjunctivae normal.  Neck:     Musculoskeletal: Neck supple.  Cardiovascular:     Rate and Rhythm: Normal rate and regular rhythm.     Heart sounds: Normal heart sounds. No murmur.  Pulmonary:     Effort: Pulmonary effort is normal. No respiratory distress.     Breath sounds: Normal breath sounds. No stridor. No wheezing or rhonchi.  Abdominal:     General: Bowel sounds are normal.     Palpations: Abdomen is soft. There is no mass.     Tenderness: There is no right CVA tenderness or left CVA tenderness.     Comments: Mild generalized ttp without rebound, rigidity or guarding.  Skin:    General: Skin is warm and dry.  Neurological:     Mental Status: He is alert.  Psychiatric:        Mood and Affect: Mood normal.    ED Treatments / Results  Labs (all labs ordered are listed, but only  abnormal results are displayed) Labs Reviewed  BASIC METABOLIC PANEL - Abnormal; Notable for the following components:      Result Value   Sodium 134 (*)    Potassium 3.4 (*)    Chloride 96 (*)    Glucose, Bld 137 (*)    BUN 22 (*)    All other components within normal limits  URINALYSIS, ROUTINE W REFLEX MICROSCOPIC - Abnormal; Notable for the following components:   Color, Urine STRAW (*)    Glucose, UA 50 (*)    Hgb urine dipstick SMALL (*)    All other components within normal limits  HEPATIC FUNCTION PANEL - Abnormal; Notable for the following components:   ALT 60 (*)    All other components within normal limits  CBG MONITORING, ED - Abnormal; Notable for the following components:   Glucose-Capillary 126 (*)    All other components within normal limits  URINE CULTURE  CBC  LIPASE, BLOOD    EKG None  Radiology Dg Chest 2 View  Result Date: 10/09/2018 CLINICAL DATA:  Chest pain. Anxiety. 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 EXAM: CHEST - 2 VIEW COMPARISON:  PET-CT 10/04/2018 FINDINGS: Right chest port with tip at the atrial caval junction. Heart is normal in size. Normal mediastinal contours. Left upper lobe scarring. No acute airspace disease. No pleural effusion or pneumothorax. No pulmonary edema. Small pulmonary nodule on CT is not seen radiographically. No acute osseous abnormalities are seen. IMPRESSION: No acute chest findings.  Left upper lobe scarring. Electronically Signed   By: Keith Rake M.D.   On: 10/09/2018 02:43    Procedures Procedures (including critical care time)  Medications Ordered in ED Medications  ondansetron (ZOFRAN) injection 4 mg (4 mg Intravenous Given 10/10/18 1538)  famotidine (PEPCID) IVPB 20 mg premix (0 mg Intravenous Stopped 10/10/18 1622)  sodium chloride 0.9 % bolus 1,000 mL (0 mLs Intravenous Stopped 10/10/18 1642)  LORazepam (ATIVAN) injection 0.5 mg (0.5 mg Intravenous Given 10/10/18 1538)  potassium chloride SA  (K-DUR,KLOR-CON) CR tablet 40 mEq (40 mEq Oral Given 10/10/18 1910)  magic mouthwash (5 mLs Oral Given 10/10/18 1701)  promethazine (PHENERGAN) injection 12.5 mg (12.5 mg Intravenous Given 10/10/18 1941)  ondansetron (ZOFRAN) injection 4 mg (  4 mg Intravenous Given 10/10/18 2136)  oxyCODONE-acetaminophen (PERCOCET/ROXICET) 5-325 MG per tablet 1 tablet (1 tablet Oral Given 10/10/18 2207)  heparin lock flush 100 unit/mL (500 Units Intracatheter Given 10/10/18 2207)     Initial Impression / Assessment and Plan / ED Course  I have reviewed the triage vital signs and the nursing notes.  Pertinent labs & imaging results that were available during my care of the patient were reviewed by me and considered in my medical decision making (see chart for details).   Final Clinical Impressions(s) / ED Diagnoses   Final diagnoses:  Generalized weakness  Oral candidiasis  Non-intractable vomiting with nausea, unspecified vomiting type   Patient presenting for evaluation for generalized weakness.  He had chemotherapy for the first time in 6 years several days ago and since then has not felt well.  Has had nausea vomiting at home and had generalized weakness today.  He is also complaining of anxiety.  He was seen in the ED yesterday for anxiety.  He was discharged with prescription for Vistaril which he states he has tried at home without relief.  On exam patient's abdomen is nontender.  He has dry heaving on exam.  Lungs are clear.  Cardiac exam normal.  Slightly tachycardic on arrival however maintained normal pulse with remainder of his stay.  Blood pressure normal and afebrile.  CBC shows no leukocytosis or anemia.  BMP with slightly low potassium, chloride and sodium.  Potassium was supplemented in the ED.  BUN slightly elevated but creatinine is normal.  Liver panel with slightly elevated ALT which is consistent with prior.  Total bilirubin is normal.  Lipase is negative.  UA shows hematuria and glucosuria, but no  leukocytes or nitrites.  Patient given antiemetics, IV fluids, pain medication and Pepcid in the ED with improvement of his symptoms.  He was also given some Ativan for his anxiety which he states improved his symptoms.  On reevaluation he is feeling somewhat better however is still complaining of some nausea.  He has not vomited for several hours and has been able to tolerate p.o.  His work-up has revealed no acute pathology at this time that would require further work-up or admission to the hospital.  He has been ambulatory in the ED without distress.  Will discharge home with course of antiemetics and short course of Ativan.  Have advised him that he needs to follow-up with his PCP in regards to his anxiety.  He also appears that he has oral candidiasis therefore was given nystatin mouthwash.  I will also place face-to-face orders and consult case management and social work to evaluate his possible home health needs.  I advised him to follow-up with rheumatologist and to return to the ER for new or worsening symptoms.  He voices understanding the plan and reasons to return to the ED.  All questions answered.  ED Discharge Orders         Ordered    nystatin (MYCOSTATIN) 100000 UNIT/ML suspension  4 times daily     10/10/18 2147    ondansetron (ZOFRAN) 4 MG tablet  Every 8 hours PRN     10/10/18 2147    LORazepam (ATIVAN) 0.5 MG tablet  3 times daily PRN     10/10/18 2147    Home Health     10/10/18 2241    Face-to-face encounter (required for Medicare/Medicaid patients)    Comments:  I [place] certify that this patient is under my care and  that I, or a nurse practitioner or physician's assistant working with me, had a face-to-face encounter that meets the physician face-to-face encounter requirements with this patient on 10/10/2018. The encounter with the patient was in whole, or in part for the following medical condition(s) which is the primary reason for home health care (List medical  condition): weakness from chemotherapy leaving pt to require help with ADLs   10/10/18 2241           Rodney Booze, PA-C 10/11/18 0011    Tegeler, Gwenyth Allegra, MD 10/11/18 0100

## 2018-10-10 NOTE — Discharge Instructions (Addendum)
You may take Ativan every 6 hours as needed for anxiety.  You will need to follow-up with your regular doctor in regards to the anxiety you have been feeling recently.  You were given a prescription for nausea medication to help with your nausea.  If you are unable to keep fluids down or become dehydrated that he may return to the emergency department.  If you have new or worsening symptoms the meantime that he may return to the emergency department.  You will be given a mouthwash to help with the infection that you have in your mouth.  Please call your oncologist tomorrow to make an appointment for follow-up.  Please return to the emergency department for new or worsening symptoms.

## 2018-10-10 NOTE — ED Notes (Signed)
Right chest port accessed.

## 2018-10-10 NOTE — ED Notes (Signed)
Patient unable to tolerate Potassium Pills. PA made aware. PA request that we try later if patient feels better. Potassium pills at bedside.

## 2018-10-10 NOTE — Telephone Encounter (Signed)
Called to follow up after he went to ER. He is complaining of nausea and states that he is not feeling well. He mumbles and is hard to understand on the phone. Offered appt with Sandi Mealy, PA today and IV fluids. He declined and states that he is too sick to come in today. He mumbled and said that he is alone at home. Instructed to call 911. He verbalized understanding.

## 2018-10-10 NOTE — ED Triage Notes (Addendum)
Per EMS, patient from home, c/o generalized weakness since chemo on 1/3. Ambulatory.   Denies N/V. Reports constipation. Seen yesterday.   BP 160/110 HR 112 RR 14 O2 98%  CBG 225

## 2018-10-11 ENCOUNTER — Other Ambulatory Visit: Payer: Self-pay | Admitting: Hematology and Oncology

## 2018-10-11 ENCOUNTER — Telehealth: Payer: Self-pay

## 2018-10-11 MED ORDER — OXYCODONE HCL 15 MG PO TABS: 15.0000 mg | ORAL_TABLET | ORAL | 0 refills | Status: DC | PRN

## 2018-10-11 NOTE — Telephone Encounter (Signed)
done

## 2018-10-11 NOTE — Telephone Encounter (Signed)
He called and left a message requesting refill on Oxycodone. Ask that Rx be sent to [facility]. He ask that nurse call him back.  Called back and left message for him to call the office back.

## 2018-10-12 LAB — URINE CULTURE: Culture: NO GROWTH

## 2018-10-13 ENCOUNTER — Telehealth: Payer: Self-pay

## 2018-10-13 NOTE — Telephone Encounter (Signed)
He called and left a message to call him.  Called back. He is wanting to cancel appts tomorrow. He is feeling better and does not want to have the nausea/vomiting side effects.. Told him tomorrow is the short day. He said his tumor is smaller. Encouraged to come in tomorrow for appts. He said he will keep appts as scheduled.

## 2018-10-14 ENCOUNTER — Inpatient Hospital Stay: Payer: Medicaid Other

## 2018-10-14 ENCOUNTER — Telehealth: Payer: Self-pay | Admitting: *Deleted

## 2018-10-14 ENCOUNTER — Encounter: Payer: Self-pay | Admitting: Hematology and Oncology

## 2018-10-14 ENCOUNTER — Inpatient Hospital Stay (HOSPITAL_BASED_OUTPATIENT_CLINIC_OR_DEPARTMENT_OTHER): Payer: Medicaid Other | Admitting: Hematology and Oncology

## 2018-10-14 DIAGNOSIS — Z7189 Other specified counseling: Secondary | ICD-10-CM

## 2018-10-14 DIAGNOSIS — R112 Nausea with vomiting, unspecified: Secondary | ICD-10-CM | POA: Insufficient documentation

## 2018-10-14 DIAGNOSIS — C760 Malignant neoplasm of head, face and neck: Secondary | ICD-10-CM

## 2018-10-14 DIAGNOSIS — S1190XD Unspecified open wound of unspecified part of neck, subsequent encounter: Secondary | ICD-10-CM | POA: Insufficient documentation

## 2018-10-14 DIAGNOSIS — G893 Neoplasm related pain (acute) (chronic): Secondary | ICD-10-CM

## 2018-10-14 DIAGNOSIS — C119 Malignant neoplasm of nasopharynx, unspecified: Secondary | ICD-10-CM

## 2018-10-14 DIAGNOSIS — E039 Hypothyroidism, unspecified: Secondary | ICD-10-CM

## 2018-10-14 DIAGNOSIS — B370 Candidal stomatitis: Secondary | ICD-10-CM | POA: Insufficient documentation

## 2018-10-14 DIAGNOSIS — Z95828 Presence of other vascular implants and grafts: Secondary | ICD-10-CM

## 2018-10-14 DIAGNOSIS — K5909 Other constipation: Secondary | ICD-10-CM

## 2018-10-14 DIAGNOSIS — C7800 Secondary malignant neoplasm of unspecified lung: Secondary | ICD-10-CM

## 2018-10-14 DIAGNOSIS — Z5111 Encounter for antineoplastic chemotherapy: Secondary | ICD-10-CM | POA: Diagnosis not present

## 2018-10-14 LAB — CBC WITH DIFFERENTIAL/PLATELET
Abs Immature Granulocytes: 0.07 10*3/uL (ref 0.00–0.07)
BASOS ABS: 0 10*3/uL (ref 0.0–0.1)
Basophils Relative: 1 %
EOS PCT: 1 %
Eosinophils Absolute: 0.1 10*3/uL (ref 0.0–0.5)
HCT: 38.9 % — ABNORMAL LOW (ref 39.0–52.0)
Hemoglobin: 13.3 g/dL (ref 13.0–17.0)
Immature Granulocytes: 1 %
Lymphocytes Relative: 25 %
Lymphs Abs: 1.5 10*3/uL (ref 0.7–4.0)
MCH: 29.8 pg (ref 26.0–34.0)
MCHC: 34.2 g/dL (ref 30.0–36.0)
MCV: 87 fL (ref 80.0–100.0)
Monocytes Absolute: 0.4 10*3/uL (ref 0.1–1.0)
Monocytes Relative: 6 %
NRBC: 0 % (ref 0.0–0.2)
Neutro Abs: 4.2 10*3/uL (ref 1.7–7.7)
Neutrophils Relative %: 66 %
Platelets: 158 10*3/uL (ref 150–400)
RBC: 4.47 MIL/uL (ref 4.22–5.81)
RDW: 12.9 % (ref 11.5–15.5)
WBC: 6.3 10*3/uL (ref 4.0–10.5)

## 2018-10-14 LAB — COMPREHENSIVE METABOLIC PANEL
ALT: 53 U/L — ABNORMAL HIGH (ref 0–44)
ANION GAP: 9 (ref 5–15)
AST: 24 U/L (ref 15–41)
Albumin: 4 g/dL (ref 3.5–5.0)
Alkaline Phosphatase: 67 U/L (ref 38–126)
BUN: 17 mg/dL (ref 6–20)
CALCIUM: 9.1 mg/dL (ref 8.9–10.3)
CO2: 28 mmol/L (ref 22–32)
Chloride: 99 mmol/L (ref 98–111)
Creatinine, Ser: 1.04 mg/dL (ref 0.61–1.24)
GFR calc Af Amer: >60 mL/min (ref >60)
GFR calc non Af Amer: >60 mL/min (ref >60)
Glucose, Bld: 119 mg/dL — ABNORMAL HIGH (ref 70–99)
Potassium: 4.3 mmol/L (ref 3.5–5.1)
Sodium: 136 mmol/L (ref 135–145)
Total Bilirubin: 0.2 mg/dL — ABNORMAL LOW (ref 0.3–1.2)
Total Protein: 7 g/dL (ref 6.5–8.1)

## 2018-10-14 LAB — TSH: TSH: 6.231 u[IU]/mL — ABNORMAL HIGH (ref 0.320–4.118)

## 2018-10-14 LAB — MAGNESIUM: Magnesium: 2 mg/dL (ref 1.7–2.4)

## 2018-10-14 MED ORDER — SODIUM CHLORIDE 0.9 % IV SOLN
2000.0000 mg | Freq: Once | INTRAVENOUS | Status: AC
  Administered 2018-10-14: 2000 mg via INTRAVENOUS
  Filled 2018-10-14: qty 52.6

## 2018-10-14 MED ORDER — SODIUM CHLORIDE 0.9 % IV SOLN
Freq: Once | INTRAVENOUS | Status: AC
  Administered 2018-10-14: 14:00:00 via INTRAVENOUS
  Filled 2018-10-14: qty 250

## 2018-10-14 MED ORDER — HEPARIN SOD (PORK) LOCK FLUSH 100 UNIT/ML IV SOLN
500.0000 [IU] | Freq: Once | INTRAVENOUS | Status: AC | PRN
  Administered 2018-10-14: 500 [IU]
  Filled 2018-10-14: qty 5

## 2018-10-14 MED ORDER — ONDANSETRON HCL 8 MG PO TABS: 8.0000 mg | ORAL_TABLET | Freq: Three times a day (TID) | ORAL | 1 refills | Status: DC | PRN

## 2018-10-14 MED ORDER — PROMETHAZINE HCL 25 MG/ML IJ SOLN
INTRAMUSCULAR | Status: AC
  Filled 2018-10-14: qty 1

## 2018-10-14 MED ORDER — ONDANSETRON HCL 4 MG/2ML IJ SOLN
8.0000 mg | Freq: Once | INTRAMUSCULAR | Status: AC
  Administered 2018-10-14: 8 mg via INTRAVENOUS

## 2018-10-14 MED ORDER — ONDANSETRON HCL 4 MG/2ML IJ SOLN
INTRAMUSCULAR | Status: AC
  Filled 2018-10-14: qty 4

## 2018-10-14 MED ORDER — SODIUM CHLORIDE 0.9% FLUSH
10.0000 mL | INTRAVENOUS | Status: DC | PRN
  Administered 2018-10-14: 10 mL
  Filled 2018-10-14: qty 10

## 2018-10-14 MED ORDER — PROMETHAZINE HCL 25 MG/ML IJ SOLN: 25.0000 mg | Freq: Once | INTRAMUSCULAR | Status: DC

## 2018-10-14 MED ORDER — PROCHLORPERAZINE MALEATE 10 MG PO TABS
ORAL_TABLET | ORAL | Status: AC
  Filled 2018-10-14: qty 1

## 2018-10-14 MED ORDER — PROCHLORPERAZINE MALEATE 10 MG PO TABS
10.0000 mg | ORAL_TABLET | Freq: Once | ORAL | Status: AC
  Administered 2018-10-14: 10 mg via ORAL

## 2018-10-14 NOTE — Patient Instructions (Signed)
[facility] Discharge Instructions for Patients Receiving Chemotherapy  Today you received the following chemotherapy agents Gemzar/Cisplatin To help prevent nausea and vomiting after your treatment, we encourage you to take your nausea medication as prescribed.  If you develop nausea and vomiting that is not controlled by your nausea medication, call the clinic.   BELOW ARE SYMPTOMS THAT SHOULD BE REPORTED IMMEDIATELY:  *FEVER GREATER THAN 100.5 F  *CHILLS WITH OR WITHOUT FEVER  NAUSEA AND VOMITING THAT IS NOT CONTROLLED WITH YOUR NAUSEA MEDICATION  *UNUSUAL SHORTNESS OF BREATH  *UNUSUAL BRUISING OR BLEEDING  TENDERNESS IN MOUTH AND THROAT WITH OR WITHOUT PRESENCE OF ULCERS  *URINARY PROBLEMS  *BOWEL PROBLEMS  UNUSUAL RASH Items with * indicate a potential emergency and should be followed up as soon as possible.  Feel free to call the clinic should you have any questions or concerns. The clinic phone number is [phone].  Please show the [facility] at check-in to the Emergency Department and triage nurse.  Gemcitabine injection (Gemzar) What is this medicine? GEMCITABINE (jem SYE ta been) is a chemotherapy drug. This medicine is used to treat many types of cancer like breast cancer, lung cancer, pancreatic cancer, and ovarian cancer. This medicine may be used for other purposes; ask your health care provider or pharmacist if you have questions. COMMON BRAND NAME(S): Gemzar, Infugem What should I tell my health care provider before I take this medicine? They need to know if you have any of these conditions: -blood disorders -infection -kidney disease -liver disease -lung or breathing disease, like asthma -recent or ongoing radiation therapy -an unusual or allergic reaction to gemcitabine, other chemotherapy, other medicines, foods, dyes, or preservatives -pregnant or trying to get pregnant -breast-feeding How should I use this medicine? This  drug is given as an infusion into a vein. It is administered in a hospital or clinic by a specially trained health care professional. Talk to your pediatrician regarding the use of this medicine in children. Special care may be needed. Overdosage: If you think you have taken too much of this medicine contact a poison control center or emergency room at once. NOTE: This medicine is only for you. Do not share this medicine with others. What if I miss a dose? It is important not to miss your dose. Call your doctor or health care professional if you are unable to keep an appointment. What may interact with this medicine? -medicines to increase blood counts like filgrastim, pegfilgrastim, sargramostim -some other chemotherapy drugs like cisplatin -vaccines Talk to your doctor or health care professional before taking any of these medicines: -acetaminophen -aspirin -ibuprofen -ketoprofen -naproxen This list may not describe all possible interactions. Give your health care provider a list of all the medicines, herbs, non-prescription drugs, or dietary supplements you use. Also tell them if you smoke, drink alcohol, or use illegal drugs. Some items may interact with your medicine. What should I watch for while using this medicine? Visit your doctor for checks on your progress. This drug may make you feel generally unwell. This is not uncommon, as chemotherapy can affect healthy cells as well as cancer cells. Report any side effects. Continue your course of treatment even though you feel ill unless your doctor tells you to stop. In some cases, you may be given additional medicines to help with side effects. Follow all directions for their use. Call your doctor or health care professional for advice if you get a fever, chills or sore throat, or  other symptoms of a cold or flu. Do not treat yourself. This drug decreases your body's ability to fight infections. Try to avoid being around people who are  sick. This medicine may increase your risk to bruise or bleed. Call your doctor or health care professional if you notice any unusual bleeding. Be careful brushing and flossing your teeth or using a toothpick because you may get an infection or bleed more easily. If you have any dental work done, tell your dentist you are receiving this medicine. Avoid taking products that contain aspirin, acetaminophen, ibuprofen, naproxen, or ketoprofen unless instructed by your doctor. These medicines may hide a fever. Do not become pregnant while taking this medicine or for 6 months after stopping it. Women should inform their doctor if they wish to become pregnant or think they might be pregnant. Men should not father a child while taking this medicine and for 3 months after stopping it. There is a potential for serious side effects to an unborn child. Talk to your health care professional or pharmacist for more information. Do not breast-feed an infant while taking this medicine or for at least 1 week after stopping it. Men should inform their doctors if they wish to father a child. This medicine may lower sperm counts. Talk with your doctor or health care professional if you are concerned about your fertility. What side effects may I notice from receiving this medicine? Side effects that you should report to your doctor or health care professional as soon as possible: -allergic reactions like skin rash, itching or hives, swelling of the face, lips, or tongue -breathing problems -pain, redness, or irritation at site where injected -signs and symptoms of a dangerous change in heartbeat or heart rhythm like chest pain; dizziness; fast or irregular heartbeat; palpitations; feeling faint or lightheaded, falls; breathing problems -signs of decreased platelets or bleeding - bruising, pinpoint red spots on the skin, black, tarry stools, blood in the urine -signs of decreased red blood cells - unusually weak or tired,  feeling faint or lightheaded, falls -signs of infection - fever or chills, cough, sore throat, pain or difficulty passing urine -signs and symptoms of kidney injury like trouble passing urine or change in the amount of urine -signs and symptoms of liver injury like dark yellow or brown urine; general ill feeling or flu-like symptoms; light-colored stools; loss of appetite; nausea; right upper belly pain; unusually weak or tired; yellowing of the eyes or skin -swelling of ankles, feet, hands Side effects that usually do not require medical attention (report to your doctor or health care professional if they continue or are bothersome): -constipation -diarrhea -hair loss -loss of appetite -nausea -rash -vomiting This list may not describe all possible side effects. Call your doctor for medical advice about side effects. You may report side effects to FDA at 1-800-FDA-1088. Where should I keep my medicine? This drug is given in a hospital or clinic and will not be stored at home. NOTE: This sheet is a summary. It may not cover all possible information. If you have questions about this medicine, talk to your doctor, pharmacist, or health care provider.  2019 [employer]/Gold Standard (2017-12-15 18:06:11)  Cisplatin injection What is this medicine? CISPLATIN (SIS pla tin) is a chemotherapy drug. It targets fast dividing cells, like cancer cells, and causes these cells to die. This medicine is used to treat many types of cancer like bladder, ovarian, and testicular cancers. This medicine may be used for other purposes; ask your health  care provider or pharmacist if you have questions. COMMON BRAND NAME(S): Platinol, Platinol -AQ What should I tell my health care provider before I take this medicine? They need to know if you have any of these conditions: -blood disorders -hearing problems -kidney disease -recent or ongoing radiation therapy -an unusual or allergic reaction to cisplatin,  carboplatin, other chemotherapy, other medicines, foods, dyes, or preservatives -pregnant or trying to get pregnant -breast-feeding How should I use this medicine? This drug is given as an infusion into a vein. It is administered in a hospital or clinic by a specially trained health care professional. Talk to your pediatrician regarding the use of this medicine in children. Special care may be needed. Overdosage: If you think you have taken too much of this medicine contact a poison control center or emergency room at once. NOTE: This medicine is only for you. Do not share this medicine with others. What if I miss a dose? It is important not to miss a dose. Call your doctor or health care professional if you are unable to keep an appointment. What may interact with this medicine? -dofetilide -foscarnet -medicines for seizures -medicines to increase blood counts like filgrastim, pegfilgrastim, sargramostim -probenecid -pyridoxine used with altretamine -rituximab -some antibiotics like amikacin, gentamicin, neomycin, polymyxin B, streptomycin, tobramycin -sulfinpyrazone -vaccines -zalcitabine Talk to your doctor or health care professional before taking any of these medicines: -acetaminophen -aspirin -ibuprofen -ketoprofen -naproxen This list may not describe all possible interactions. Give your health care provider a list of all the medicines, herbs, non-prescription drugs, or dietary supplements you use. Also tell them if you smoke, drink alcohol, or use illegal drugs. Some items may interact with your medicine. What should I watch for while using this medicine? Your condition will be monitored carefully while you are receiving this medicine. You will need important blood work done while you are taking this medicine. This drug may make you feel generally unwell. This is not uncommon, as chemotherapy can affect healthy cells as well as cancer cells. Report any side effects. Continue  your course of treatment even though you feel ill unless your doctor tells you to stop. In some cases, you may be given additional medicines to help with side effects. Follow all directions for their use. Call your doctor or health care professional for advice if you get a fever, chills or sore throat, or other symptoms of a cold or flu. Do not treat yourself. This drug decreases your body's ability to fight infections. Try to avoid being around people who are sick. This medicine may increase your risk to bruise or bleed. Call your doctor or health care professional if you notice any unusual bleeding. Be careful brushing and flossing your teeth or using a toothpick because you may get an infection or bleed more easily. If you have any dental work done, tell your dentist you are receiving this medicine. Avoid taking products that contain aspirin, acetaminophen, ibuprofen, naproxen, or ketoprofen unless instructed by your doctor. These medicines may hide a fever. Do not become pregnant while taking this medicine. Women should inform their doctor if they wish to become pregnant or think they might be pregnant. There is a potential for serious side effects to an unborn child. Talk to your health care professional or pharmacist for more information. Do not breast-feed an infant while taking this medicine. Drink fluids as directed while you are taking this medicine. This will help protect your kidneys. Call your doctor or health care professional if you  get diarrhea. Do not treat yourself. What side effects may I notice from receiving this medicine? Side effects that you should report to your doctor or health care professional as soon as possible: -allergic reactions like skin rash, itching or hives, swelling of the face, lips, or tongue -signs of infection - fever or chills, cough, sore throat, pain or difficulty passing urine -signs of decreased platelets or bleeding - bruising, pinpoint red spots on the  skin, black, tarry stools, nosebleeds -signs of decreased red blood cells - unusually weak or tired, fainting spells, lightheadedness -breathing problems -changes in hearing -gout pain -low blood counts - This drug may decrease the number of white blood cells, red blood cells and platelets. You may be at increased risk for infections and bleeding. -nausea and vomiting -pain, swelling, redness or irritation at the injection site -pain, tingling, numbness in the hands or feet -problems with balance, movement -trouble passing urine or change in the amount of urine Side effects that usually do not require medical attention (report to your doctor or health care professional if they continue or are bothersome): -changes in vision -loss of appetite -metallic taste in the mouth or changes in taste This list may not describe all possible side effects. Call your doctor for medical advice about side effects. You may report side effects to FDA at 1-800-FDA-1088. Where should I keep my medicine? This drug is given in a hospital or clinic and will not be stored at home. NOTE: This sheet is a summary. It may not cover all possible information. If you have questions about this medicine, talk to your doctor, pharmacist, or health care provider.  2019 [employer]vier/Gold Standard (2007-12-27 14:40:54)

## 2018-10-14 NOTE — Assessment & Plan Note (Signed)
He was prescribed nystatin swish and swallow Christian Simmons has almost completely resolved

## 2018-10-14 NOTE — Assessment & Plan Note (Signed)
He tolerated cycle 1 day 1 of chemotherapy poorly, complicated by severe nausea and vomiting. We will proceed with cycle 1 day 8 today I will try to consult advanced home care service to see if we can set up IV fluids at home to avoid dehydration and emergency room visits I recommend minimum 3 cycles of treatment before repeat imaging study

## 2018-10-14 NOTE — Assessment & Plan Note (Signed)
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

## 2018-10-14 NOTE — Telephone Encounter (Signed)
Received voicemail from patient- he is not able to eat or drink, he is dry heaving. He wanted to push his appointment to later in the afternoon. He was headed to a friends house to get help caring for himself. He sounds very short of breath on the message.  Returned call to patient stressing the importance of keeping this appointment as is. He needs to be evaluated. He promises to keep his noon appointment today. He will have his friend drive him.

## 2018-10-14 NOTE — Assessment & Plan Note (Signed)
His severe constipation probably contributed to his nausea.  We discussed aggressive laxative therapy

## 2018-10-14 NOTE — Progress Notes (Signed)
[place] OFFICE PROGRESS NOTE  Patient Care Team: Patient, No Pcp Per as PCP - General (General Practice) [name], MD as Consulting Physician (Hematology and Oncology) Eppie Gibson, MD as Attending Physician (Radiation Oncology) Jodi Marble, MD as Consulting Physician (Otolaryngology) Philomena Doheny, MD as Referring Physician (Plastic Surgery) Irene Shipper, MD as Consulting Physician (Gastroenterology) System, Provider Not In  ASSESSMENT & PLAN:  Primary cancer of head and neck [facility]) He tolerated cycle 1 day 1 of chemotherapy poorly, complicated by severe nausea and vomiting. We will proceed with cycle 1 day 8 today I will try to consult advanced home care service to see if we can set up IV fluids at home to avoid dehydration and emergency room visits I recommend minimum 3 cycles of treatment before repeat imaging study  Nausea and vomiting He has severe uncontrolled nausea and vomiting despite dexamethasone, Compazine and Zofran He will receive additional IV fluid support today and tomorrow He will also get IV antiemetics today I recommend IV fluid support with IV antiemetics at home if possible  Cancer associated pain His cancer pain has resolved since chemotherapy.  He will take oxycodone as needed  Wound, open, neck, subsequent encounter The malignant neck lesion has regressed in size but is draining It looks clean and does not look infected We will also consult advanced home care to check on his wound weekly  Oral thrush He was prescribed nystatin swish and swallow Thrush has almost completely resolved  Other constipation His severe constipation probably contributed to his nausea.  We discussed aggressive laxative therapy   Orders Placed This Encounter  Procedures  . Ambulatory referral to Home Health    Referral Priority:   Routine    Referral Type:   Home Health Care    Referral Reason:   Specialty Services Required    Requested  Specialty:   [place]    Number of Visits Requested:   1    INTERVAL HISTORY: Please see below for problem oriented charting. He returns for further follow-up, to be seen prior to cycle 1 day 8 of treatment He developed significant nausea and vomiting after cycle 1 of treatment He complained of weakness He also has severe constipation, resolved with laxatives He denies neuropathy. The wound on his neck is draining but is getting smaller.  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 Pan-endoscopy elsewhere showed cancer from nasopharyngeal space.    01/04/2007 - 02/20/2007 Chemotherapy    He received 2 cycles of cisplatin and 5FU followed by concurrent chemo with weekly cisplatin and radiation. He only received 2 doses of chemo due to severe mucositis, nausea and weight loss.    04/05/2007 - 08/04/2007 Chemotherapy    He received 4 more courses of cisplatin with 5FU and had complete response    07/05/2009 Procedure  Fine-needle aspirate of the right level II lymph nodes come from recurrent metastatic disease. Repeat endoscopy and CT scan show no evidence of disease elsewhere.    07/08/2009 - 12/02/2009 Chemotherapy    He was given 6 cycles of carboplatin, 5-FU and docetaxel    12/03/2009 Surgery    He has surgery to the residual lymph node on the right neck which showed no evidence of disease.     02/22/2012 Imaging    Repeat imaging study showed large recurrent mass. He was referred elsewhere for further treatment.    05/03/2012 Surgery    He underwent left upper lobectomy.    04/29/2013 Imaging    PEt scan showed lesion on right level II B and lower lung was abnormal    06/03/2013 - 02/02/2014 Chemotherapy    He had 6 cycles of chemotherapy when he was found to have recurrence of cancer and had received oxaliplatin and capecitabine    06/07/2014 Imaging    PET CT scan showed persistent disease in the right neck lymph nodes and left lung    06/29/2014 Procedure    Accession: [id number] repeat LUL biopsy confirmed metastatic cancer    07/18/2014 - 07/31/2014 Radiation Therapy    He received palliative radiation therapy to the lungs    10/10/2014 Imaging    CT scan of the chest, abdomen and pelvis show regression in the size of the lung nodule in the left upper lobe and stable pulmonary nodules    01/24/2015 Imaging    CT scan showed stable disease in neck and lung    06/19/2015 Imaging    CT scan of the neck and the chest show possible mild progression of the nodule in the right side of the neck.    06/25/2015 Imaging    PET scan confirmed disease recurrence in the neck    07/07/2015 Imaging    He had [facility] neck [facility]    09/03/2015 - 08/26/2018 Chemotherapy    He received palliative chemo with Nivolumab    10/29/2015 Imaging    PET CT showed positive response to Rx    02/28/2016 Imaging    Ct abdomen showed abnormal thinkening in his stomach    03/03/2016 Imaging    CT: Right sternocleidomastoid muscle metastasis appears less distinct but otherwise not significantly changed in size or configuration since 06/19/2015.2. Left level 3 lymph node which was hypermetabolic by PET-CT in January 2017 appears slightly smaller    04/01/2016 Imaging    CT cervical spine showed no acute fracture or traumatic malalignment in the cervical spine    04/22/2016 Procedure    Port-a-cath placed.     06/16/2016 Imaging    Ct neck showed right sternocleidomastoid muscle metastasis is further decreased in conspicuity since May, and has mildly decreased in size since September 2016. Continued stability of sub-centimeter left cervical lymph nodes. No new or progressive metastatic disease in the neck.    06/16/2016 Imaging    CT chest showed stable masslike radiation fibrosis in the left upper lobe. Stable subcentimeter pulmonary nodules in the bilateral lower lobes. No new or progressive metastatic disease in the chest. Nonobstructing left renal stone.    10/13/2016 Imaging    Ct neck showed unchanged right sternocleidomastoid muscle metastasis. Unchanged subcentimeter left cervical lymph nodes. No evidence of new or progressive metastatic disease in the neck.    10/13/2016 Imaging    CT chest showed tiny hypervascular foci in the liver, not definitely seen on prior imaging of 06/16/2016  and 02/28/2016. Abdomen MRI without and with contrast recommended to further evaluate as metastatic disease is a concern. 2. Stable appearance of post treatment changes left upper lung and scattered tiny bilateral pulmonary nodules.    02/11/2017 Imaging    Ct neck: Lymph node mass right posterior neck appears improved from the prior study. Small posterior lymph nodes on the left unchanged. Occluded right jugular vein unchanged.    02/11/2017 Imaging    1. Similar appearance of postsurgical and radiation changes in the left upper lobe. 2. Similar bilateral pulmonary nodules. 3. No thoracic adenopathy. 4. Subtle foci of post-contrast enhancement within the liver are suboptimally characterized on this nondedicated study. Likely similar. These could either be re-evaluated at followup or more entirely characterized with abdominal MRI. 5. Left nephrolithiasis.    05/19/2017 Imaging    Matted lymph node mass right posterior neck appears larger in the recent CT. Accurate measurements difficult due to infiltrating tumor  margins and infiltration of the muscle. Right jugular vein again appears occluded or resected. Small left posterior lymph nodes stable. Left upper lobe airspace density stable and similar to the prior CT    06/03/2017 PET scan    1. Hypermetabolic ill-defined right level IIb lymph node, about 1.3 cm in diameter with maximum SUV 9.5 (formerly 8.1). Appearance suspicious for residual/recurrent malignancy. No worrisome left-sided lesion. 2. Left suprahilar indistinct opacity demonstrates no worrisome hypermetabolic activity. The 5 mm left lower lobe pulmonary nodule is stable and not currently hypermetabolic although below sensitive PET-CT size thresholds. 3. Other imaging findings of potential clinical significance: Bilateral nonobstructive nephrolithiasis. Chronic bilateral maxillary sinusitis.    05/11/2018 PET scan    1. Continued chronic accentuated metabolic activity in the vicinity of right level IIB and the adjacent right sternocleidomastoid muscle, with ill definition of surrounding tissue planes. Maximum SUV is currently 8.1, formerly 9.5. Accentuated metabolic activity is been present in this vicinity back through 06/25/2015, and there was also some low-level activity in this vicinity on 06/07/2014. Some of this may be from scarring and local muscular activity although clearly a component of residual tumor is difficult to exclude given the focally high activity. 2. Other imaging findings of potential clinical significance: Chronic bilateral maxillary sinusitis. Chronic scarring in the left upper lobe. Chronically stable 5 mm left lower lobe nodule is considered benign. Nonobstructive left nephrolithiasis.    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 COMMENT:No significant keratinization is identified. Other basaloid carcinomas are in the  differential diagnosis. No cell block material is available for further testing.    09/12/2018 Procedure    He underwent fine Needle Aspiration    10/04/2018 PET scan    1. Significant progression of local recurrence laterally in the mid right neck with an enlarging, increasingly hypermetabolic soft tissue mass. This involves the right sternocleidomastoid muscle. 2. Small lymph nodes in the right axilla are increasingly hypermetabolic. These are nonspecific and potentially reactive, although could reflect a small metastases. Small hypermetabolic nodule in the left suprasternal notch is unchanged. 3. No other evidence of metastatic disease.     10/07/2018 -  Chemotherapy    The patient had cisplatin plus gemzar     REVIEW OF SYSTEMS:   Constitutional: Denies fevers, chills or abnormal weight loss Eyes: Denies blurriness of vision Ears, nose, mouth, throat, and face: Denies mucositis or sore throat Respiratory: Denies cough, dyspnea or wheezes  Cardiovascular: Denies palpitation, chest discomfort or lower extremity swelling Skin: Denies abnormal skin rashes Lymphatics: Denies new lymphadenopathy or easy bruising Behavioral/Psych: Mood is stable, no new changes  All other systems were reviewed with the patient and are negative.  I have reviewed the past medical history, past surgical history, social history and family history with the patient and they are unchanged from previous note.  ALLERGIES:  is allergic to heparin and clindamycin.  MEDICATIONS:  Current Outpatient Medications  Medication Sig Dispense Refill  . dexamethasone (DECADRON) 4 MG tablet Take 2 tablets by mouth after cisplatin chemotherapy for 2 days in the morning. Take with food. 30 tablet 1  . diclofenac (VOLTAREN) 50 MG EC tablet TAKE 1 TABLET BY MOUTH THREE TIMES DAILY AS NEEDED FOR MILD PAIN (Patient not taking: No sig reported) 270 tablet 0  . hydrOXYzine (ATARAX/VISTARIL) 25 MG tablet Take 1 tablet (25 mg total)  by mouth every 6 (six) hours. 12 tablet 0  . levothyroxine (SYNTHROID, LEVOTHROID) 137 MCG tablet TAKE 1 TABLET(137 MCG) BY MOUTH DAILY BEFORE BREAKFAST (Patient taking differently: Take 137 mcg by mouth daily before breakfast. ) 90 tablet 0  . lidocaine-prilocaine (EMLA) cream Apply 1 application topically as needed. (Patient not taking: Reported on 10/10/2018) 30 g 3  . LORazepam (ATIVAN) 0.5 MG tablet Take 2 tablets (1 mg total) by mouth 3 (three) times daily as needed for up to 3 doses for anxiety. 3 tablet 0  . ondansetron (ZOFRAN) 8 MG tablet Take 1 tablet (8 mg total) by mouth every 8 (eight) hours as needed for nausea or vomiting. 90 tablet 1  . oxyCODONE (ROXICODONE) 15 MG immediate release tablet Take 1 tablet (15 mg total) by mouth every 4 (four) hours as needed for pain. 60 tablet 0  . polyethylene glycol (MIRALAX) packet Take 17 g by mouth daily. (Patient taking differently: Take 17 g by mouth daily as needed for mild constipation or moderate constipation. ) 14 each 3  . prochlorperazine (COMPAZINE) 10 MG tablet Take 1 tablet (10 mg total) by mouth every 6 (six) hours as needed (Nausea or vomiting). 30 tablet 1   No current facility-administered medications for this visit.    Facility-Administered Medications Ordered in Other Visits  Medication Dose Route Frequency Provider Last Rate Last Dose  . 0.9 %  sodium chloride infusion   Intravenous Once Arshad Oberholzer, MD      . 0.9 %  sodium chloride infusion   Intravenous Once Alvy Bimler, Layson Bertsch, MD      . gemcitabine (GEMZAR) 2,000 mg in sodium chloride 0.9 % 250 mL chemo infusion  2,000 mg Intravenous Once Alvy Bimler, Joreen Swearingin, MD      . heparin lock flush 100 unit/mL  500 Units Intracatheter Once PRN Alvy Bimler, Faithlynn Deeley, MD      . ondansetron (ZOFRAN) injection 8 mg  8 mg Intravenous Once Alvy Bimler, Teirra Carapia, MD      . prochlorperazine (COMPAZINE) tablet 10 mg  10 mg Oral Once Alvy Bimler, Cleophas Yoak, MD      . sodium chloride flush (NS) 0.9 % injection 10 mL  10 mL Intracatheter PRN  Alvy Bimler, Rendell Thivierge, MD        PHYSICAL EXAMINATION: ECOG PERFORMANCE STATUS: 2 - Symptomatic, <50% confined to bed  Vitals:   10/14/18 1315  BP: 124/87  Pulse: 98  SpO2: 100%   Filed Weights   10/14/18 1315  Weight: 190 lb 6.4 oz (86.4 kg)    GENERAL:alert, no distress and comfortable.  He looks unwell SKIN: skin  color, texture, turgor are normal, no rashes or significant lesions EYES: normal, Conjunctiva are pink and non-injected, sclera clear OROPHARYNX:no exudate, no erythema and lips, buccal mucosa, and tongue normal  NECK: The right neck wound is reduced in size by greater than 50% change, but it seems to be draining some serous fluid LYMPH:  no palpable lymphadenopathy in the cervical, axillary or inguinal LUNGS: clear to auscultation and percussion with normal breathing effort HEART: regular rate & rhythm and no murmurs and no lower extremity edema ABDOMEN:abdomen soft, non-tender and normal bowel sounds Musculoskeletal:no cyanosis of digits and no clubbing  NEURO: alert & oriented x 3 with fluent speech, no focal motor/sensory deficits  LABORATORY DATA:  I have reviewed the data as listed    Component Value Date/Time   NA 136 10/14/2018 1217   NA 139 09/22/2017 0829   K 4.3 10/14/2018 1217   K 3.5 09/22/2017 0829   CL 99 10/14/2018 1217   CO2 28 10/14/2018 1217   CO2 26 09/22/2017 0829   GLUCOSE 119 (H) 10/14/2018 1217   GLUCOSE 133 09/22/2017 0829   BUN 17 10/14/2018 1217   BUN 14.1 09/22/2017 0829   CREATININE 1.04 10/14/2018 1217   CREATININE 0.9 09/22/2017 0829   CALCIUM 9.1 10/14/2018 1217   CALCIUM 9.1 09/22/2017 0829   PROT 7.0 10/14/2018 1217   PROT 6.8 09/22/2017 0829   ALBUMIN 4.0 10/14/2018 1217   ALBUMIN 4.1 09/22/2017 0829   AST 24 10/14/2018 1217   AST 22 09/22/2017 0829   ALT 53 (H) 10/14/2018 1217   ALT 30 09/22/2017 0829   ALKPHOS 67 10/14/2018 1217   ALKPHOS 55 09/22/2017 0829   BILITOT 0.2 (L) 10/14/2018 1217   BILITOT 0.35 09/22/2017  0829   GFRNONAA >60 10/14/2018 1217   GFRAA >60 10/14/2018 1217    No results found for: SPEP, UPEP  Lab Results  Component Value Date   WBC 6.3 10/14/2018   NEUTROABS 4.2 10/14/2018   HGB 13.3 10/14/2018   HCT 38.9 (L) 10/14/2018   MCV 87.0 10/14/2018   PLT 158 10/14/2018      Chemistry      Component Value Date/Time   NA 136 10/14/2018 1217   NA 139 09/22/2017 0829   K 4.3 10/14/2018 1217   K 3.5 09/22/2017 0829   CL 99 10/14/2018 1217   CO2 28 10/14/2018 1217   CO2 26 09/22/2017 0829   BUN 17 10/14/2018 1217   BUN 14.1 09/22/2017 0829   CREATININE 1.04 10/14/2018 1217   CREATININE 0.9 09/22/2017 0829      Component Value Date/Time   CALCIUM 9.1 10/14/2018 1217   CALCIUM 9.1 09/22/2017 0829   ALKPHOS 67 10/14/2018 1217   ALKPHOS 55 09/22/2017 0829   AST 24 10/14/2018 1217   AST 22 09/22/2017 0829   ALT 53 (H) 10/14/2018 1217   ALT 30 09/22/2017 0829   BILITOT 0.2 (L) 10/14/2018 1217   BILITOT 0.35 09/22/2017 0829       RADIOGRAPHIC STUDIES: I have personally reviewed the radiological images as listed and agreed with the findings in the report. Dg Chest 2 View  Result Date: 10/09/2018 CLINICAL DATA:  Chest pain. Anxiety. 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 EXAM: CHEST - 2 VIEW COMPARISON:  PET-CT 10/04/2018 FINDINGS: Right chest port with tip at the atrial caval junction. Heart is normal in size. Normal mediastinal contours. Left upper lobe scarring. No acute airspace disease. No pleural effusion or pneumothorax. No pulmonary edema. Small pulmonary  nodule on CT is not seen radiographically. No acute osseous abnormalities are seen. IMPRESSION: No acute chest findings.  Left upper lobe scarring. Electronically Signed   By: Keith Rake M.D.   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  EXAM: NUCLEAR MEDICINE PET SKULL BASE TO THIGH TECHNIQUE: 9.55 mCi F-18 FDG was injected intravenously. Full-ring PET imaging was performed from the skull base to thigh after the radiotracer. CT data was obtained and used for attenuation correction and anatomic localization. Fasting blood glucose: 106 mg/dl COMPARISON:  PET-CT 05/11/2018 and 06/03/2017.  Neck CT 05/19/2017. FINDINGS: Mediastinal blood pool activity: SUV max 2.2 NECK: There is an enlarging hypermetabolic mass in the mid right neck laterally, which is exophytic and measuring up to 3.4 x 2.9 cm on image 29/4. This has an SUV max of 22.2 (previously 8.1). This mass involves the right sternocleidomastoid muscle. No new or enlarging hypermetabolic nodal masses are identified. There is a stable mildly hypermetabolic nodule in the left suprasternal notch, measuring 9 x 16 mm on image 52 and demonstrating an SUV max of 3.5.There are no lesions of the pharyngeal mucosal space. Incidental CT findings: none CHEST: There are no hypermetabolic mediastinal or hilar lymph nodes. There are small nodes inferiorly in the right axilla which have mildly enlarged compared with the prior study (measuring up to 8 mm on image 76/4) and are mildly hypermetabolic (SUV max 3.7). No suspicious pulmonary activity. Incidental CT findings: Stable radiation changes in both upper lobes. Stable subpleural 5 mm left lower lobe nodule on image 33/8. ABDOMEN/PELVIS: There is no hypermetabolic activity within the liver, adrenal glands, spleen or pancreas. There is no hypermetabolic nodal activity. Incidental CT findings: none SKELETON: There is no hypermetabolic activity to suggest osseous metastatic disease. Decreased activity in the upper thoracic spine attributed to previous radiation therapy. Incidental CT findings: none IMPRESSION: 1. Significant progression of local recurrence laterally in the mid right neck with an enlarging, increasingly hypermetabolic soft tissue mass. This  involves the right sternocleidomastoid muscle. 2. Small lymph nodes in the right axilla are increasingly hypermetabolic. These are nonspecific and potentially reactive, although could reflect a small metastases. Small hypermetabolic nodule in the left suprasternal notch is unchanged. 3. No other evidence of metastatic disease. Electronically Signed   By: Richardean Sale M.D.   On: 10/04/2018 16:27    All questions were answered. The patient knows to call the clinic with any problems, questions or concerns. No barriers to learning was detected.  I spent 40 minutes counseling the patient face to face. The total time spent in the appointment was 55 minutes and more than 50% was on counseling and review of test results  [name], MD 10/14/2018 1:40 PM

## 2018-10-14 NOTE — Assessment & Plan Note (Signed)
He has severe uncontrolled nausea and vomiting despite dexamethasone, Compazine and Zofran He will receive additional IV fluid support today and tomorrow He will also get IV antiemetics today I recommend IV fluid support with IV antiemetics at home if possible

## 2018-10-14 NOTE — Assessment & Plan Note (Signed)
His cancer pain has resolved since chemotherapy.  He will take oxycodone as needed

## 2018-10-14 NOTE — Progress Notes (Signed)
While patient ambulating in hall, this RN noted mild drainage from right neck. Per office visit note from Dr. Alvy Bimler, she is aware of situation and is following. No intervention needed at this time.

## 2018-10-15 ENCOUNTER — Inpatient Hospital Stay: Payer: Medicaid Other

## 2018-10-15 VITALS — BP 109/63 | HR 101 | Temp 99.6°F | Resp 18

## 2018-10-15 DIAGNOSIS — C760 Malignant neoplasm of head, face and neck: Secondary | ICD-10-CM

## 2018-10-15 DIAGNOSIS — C7800 Secondary malignant neoplasm of unspecified lung: Secondary | ICD-10-CM

## 2018-10-15 DIAGNOSIS — Z95828 Presence of other vascular implants and grafts: Secondary | ICD-10-CM

## 2018-10-15 DIAGNOSIS — Z5111 Encounter for antineoplastic chemotherapy: Secondary | ICD-10-CM | POA: Diagnosis not present

## 2018-10-15 MED ORDER — SODIUM CHLORIDE 0.9% FLUSH
10.0000 mL | Freq: Once | INTRAVENOUS | Status: AC
  Administered 2018-10-15: 10 mL
  Filled 2018-10-15: qty 10

## 2018-10-15 MED ORDER — SODIUM CHLORIDE 0.9 % IV SOLN
Freq: Once | INTRAVENOUS | Status: AC
  Administered 2018-10-15: 09:00:00 via INTRAVENOUS
  Filled 2018-10-15: qty 250

## 2018-10-15 NOTE — Patient Instructions (Signed)
Dehydration, Adult  Dehydration is when there is not enough fluid or water in your body. This happens when you lose more fluids than you take in. Dehydration can range from mild to very bad. It should be treated right away to keep it from getting very bad. Symptoms of mild dehydration may include:  Thirst.  Dry lips.  Slightly dry mouth.  Dry, warm skin.  Dizziness. Symptoms of moderate dehydration may include:  Very dry mouth.  Muscle cramps.  Dark pee (urine). Pee may be the color of tea.  Your body making less pee.  Your eyes making fewer tears.  Heartbeat that is uneven or faster than normal (palpitations).  Headache.  Light-headedness, especially when you stand up from sitting.  Fainting (syncope). Symptoms of very bad dehydration may include:  Changes in skin, such as: ? Cold and clammy skin. ? Blotchy (mottled) or pale skin. ? Skin that does not quickly return to normal after being lightly pinched and let go (poor skin turgor).  Changes in body fluids, such as: ? Feeling very thirsty. ? Your eyes making fewer tears. ? Not sweating when body temperature is high, such as in hot weather. ? Your body making very little pee.  Changes in vital signs, such as: ? Weak pulse. ? Pulse that is more than 100 beats a minute when you are sitting still. ? Fast breathing. ? Low blood pressure.  Other changes, such as: ? Sunken eyes. ? Cold hands and feet. ? Confusion. ? Lack of energy (lethargy). ? Trouble waking up from sleep. ? Short-term weight loss. ? Unconsciousness. Follow these instructions at home:   If told by your doctor, drink an ORS: ? Make an ORS by using instructions on the package. ? Start by drinking small amounts, about  cup (120 mL) every 5-10 minutes. ? Slowly drink more until you have had the amount that your doctor said to have.  Drink enough clear fluid to keep your pee clear or pale yellow. If you were told to drink an ORS, finish the  ORS first, then start slowly drinking clear fluids. Drink fluids such as: ? Water. Do not drink only water by itself. Doing that can make the salt (sodium) level in your body get too low (hyponatremia). ? Ice chips. ? Fruit juice that you have added water to (diluted). ? Low-calorie sports drinks.  Avoid: ? Alcohol. ? Drinks that have a lot of sugar. These include high-calorie sports drinks, fruit juice that does not have water added, and soda. ? Caffeine. ? Foods that are greasy or have a lot of fat or sugar.  Take over-the-counter and prescription medicines only as told by your doctor.  Do not take salt tablets. Doing that can make the salt level in your body get too high (hypernatremia).  Eat foods that have minerals (electrolytes). Examples include bananas, oranges, potatoes, tomatoes, and spinach.  Keep all follow-up visits as told by your doctor. This is important. Contact a doctor if:  You have belly (abdominal) pain that: ? Gets worse. ? Stays in one area (localizes).  You have a rash.  You have a stiff neck.  You get angry or annoyed more easily than normal (irritability).  You are more sleepy than normal.  You have a harder time waking up than normal.  You feel: ? Weak. ? Dizzy. ? Very thirsty.  You have peed (urinated) only a small amount of very dark pee during 6-8 hours. Get help right away if:  You have   symptoms of very bad dehydration.  You cannot drink fluids without throwing up (vomiting).  Your symptoms get worse with treatment.  You have a fever.  You have a very bad headache.  You are throwing up or having watery poop (diarrhea) and it: ? Gets worse. ? Does not go away.  You have blood or something green (bile) in your throw-up.  You have blood in your poop (stool). This may cause poop to look black and tarry.  You have not peed in 6-8 hours.  You pass out (faint).  Your heart rate when you are sitting still is more than 100 beats a  minute.  You have trouble breathing. This information is not intended to replace advice given to you by your health care provider. Make sure you discuss any questions you have with your health care provider. Document Released: 07/18/2009 Document Revised: 04/10/2016 Document Reviewed: 11/15/2015 Elsevier Interactive Patient Education  2019 [employer].  

## 2018-10-17 ENCOUNTER — Telehealth: Payer: Self-pay

## 2018-10-17 NOTE — Telephone Encounter (Signed)
He called and left a message. He would like to get IV fluids tomorrow. He is able to eat, but is having a hard time drinking. Scheduling message sent for IV fluids tomorrow.

## 2018-10-17 NOTE — Telephone Encounter (Signed)
Called with below message. He verbalized understanding. He is feeling good today and does not need IV fluids at this time. He is concerned about the next treatment and requesting IV fluids daily after treatment on 1/25 thru 2/1. Scheduling message sent.

## 2018-10-17 NOTE — Telephone Encounter (Signed)
-----   Message from [name], MD sent at 10/17/2018  9:04 AM EST ----- Regarding: how is he Tell him Medicaid will not cover home IVF Does he need IVF this week? Can he go to [facility] office or he wants to come here?

## 2018-10-17 NOTE — Progress Notes (Signed)
LATE entry from 1/10/ 20 : Patient was seen at [facility] after being treated at [facility] on an earlier date. He was suffering with nausea and dry heaving. He was assessed by MD Alvy Bimler and was able to tolerate another infusion that day - this assessment and follow up infusion took place on 10/14/2018.

## 2018-10-18 ENCOUNTER — Inpatient Hospital Stay: Payer: Medicaid Other

## 2018-10-18 ENCOUNTER — Other Ambulatory Visit: Payer: Self-pay | Admitting: Hematology and Oncology

## 2018-10-18 ENCOUNTER — Other Ambulatory Visit: Payer: Self-pay | Admitting: Medical

## 2018-10-18 ENCOUNTER — Telehealth: Payer: Self-pay | Admitting: Hematology and Oncology

## 2018-10-18 ENCOUNTER — Inpatient Hospital Stay (HOSPITAL_BASED_OUTPATIENT_CLINIC_OR_DEPARTMENT_OTHER): Payer: Medicaid Other | Admitting: Medical

## 2018-10-18 VITALS — BP 106/69 | HR 79 | Temp 98.7°F | Resp 18

## 2018-10-18 DIAGNOSIS — C7800 Secondary malignant neoplasm of unspecified lung: Secondary | ICD-10-CM

## 2018-10-18 DIAGNOSIS — R110 Nausea: Secondary | ICD-10-CM

## 2018-10-18 DIAGNOSIS — F419 Anxiety disorder, unspecified: Secondary | ICD-10-CM

## 2018-10-18 DIAGNOSIS — C760 Malignant neoplasm of head, face and neck: Secondary | ICD-10-CM

## 2018-10-18 DIAGNOSIS — Z5111 Encounter for antineoplastic chemotherapy: Secondary | ICD-10-CM | POA: Diagnosis not present

## 2018-10-18 DIAGNOSIS — Z95828 Presence of other vascular implants and grafts: Secondary | ICD-10-CM

## 2018-10-18 MED ORDER — LORAZEPAM 2 MG/ML IJ SOLN
INTRAMUSCULAR | Status: AC
  Filled 2018-10-18: qty 1

## 2018-10-18 MED ORDER — OMEPRAZOLE 20 MG PO CPDR: 20.0000 mg | DELAYED_RELEASE_CAPSULE | Freq: Every day | ORAL | 5 refills | Status: DC

## 2018-10-18 MED ORDER — PROMETHAZINE HCL 25 MG/ML IJ SOLN
INTRAMUSCULAR | Status: AC
  Filled 2018-10-18: qty 1

## 2018-10-18 MED ORDER — ANTICOAGULANT SODIUM CITRATE 4% (200MG/5ML) IV SOLN
5.0000 mL | Freq: Once | Status: AC
  Administered 2018-10-18: 5 mL
  Filled 2018-10-18: qty 5

## 2018-10-18 MED ORDER — PROMETHAZINE HCL 25 MG/ML IJ SOLN
25.0000 mg | Freq: Once | INTRAMUSCULAR | Status: AC
  Administered 2018-10-18: 25 mg via INTRAVENOUS

## 2018-10-18 MED ORDER — SODIUM CHLORIDE 0.9 % IV SOLN
Freq: Once | INTRAVENOUS | Status: AC
  Administered 2018-10-18: 15:00:00 via INTRAVENOUS
  Filled 2018-10-18: qty 250

## 2018-10-18 NOTE — Progress Notes (Signed)
Pt received 25 mg IV phenergan over 5 minutes diluted in 10 mL NS per supportive therapy order.  Just a few minutes post phenergan pt complaint of having a severe anxiety attack.  Pt HR in the 140's and BP elevated.  Ralene Ok, PA to bedside to assess pt.  Pt declined ativan IV and wants to try to rest to relieve the anxiety.  Pt transferred to the bed. Dr. Alvy Bimler notified and she stated she will remove phenergan from supportive therapy.  Will continue to closely monitor.

## 2018-10-18 NOTE — Telephone Encounter (Signed)
Left message re appointment for 1/15.

## 2018-10-18 NOTE — Patient Instructions (Signed)
Dehydration, Adult  Dehydration is when there is not enough fluid or water in your body. This happens when you lose more fluids than you take in. Dehydration can range from mild to very bad. It should be treated right away to keep it from getting very bad. Symptoms of mild dehydration may include:  Thirst.  Dry lips.  Slightly dry mouth.  Dry, warm skin.  Dizziness. Symptoms of moderate dehydration may include:  Very dry mouth.  Muscle cramps.  Dark pee (urine). Pee may be the color of tea.  Your body making less pee.  Your eyes making fewer tears.  Heartbeat that is uneven or faster than normal (palpitations).  Headache.  Light-headedness, especially when you stand up from sitting.  Fainting (syncope). Symptoms of very bad dehydration may include:  Changes in skin, such as: ? Cold and clammy skin. ? Blotchy (mottled) or pale skin. ? Skin that does not quickly return to normal after being lightly pinched and let go (poor skin turgor).  Changes in body fluids, such as: ? Feeling very thirsty. ? Your eyes making fewer tears. ? Not sweating when body temperature is high, such as in hot weather. ? Your body making very little pee.  Changes in vital signs, such as: ? Weak pulse. ? Pulse that is more than 100 beats a minute when you are sitting still. ? Fast breathing. ? Low blood pressure.  Other changes, such as: ? Sunken eyes. ? Cold hands and feet. ? Confusion. ? Lack of energy (lethargy). ? Trouble waking up from sleep. ? Short-term weight loss. ? Unconsciousness. Follow these instructions at home:   If told by your doctor, drink an ORS: ? Make an ORS by using instructions on the package. ? Start by drinking small amounts, about  cup (120 mL) every 5-10 minutes. ? Slowly drink more until you have had the amount that your doctor said to have.  Drink enough clear fluid to keep your pee clear or pale yellow. If you were told to drink an ORS, finish the  ORS first, then start slowly drinking clear fluids. Drink fluids such as: ? Water. Do not drink only water by itself. Doing that can make the salt (sodium) level in your body get too low (hyponatremia). ? Ice chips. ? Fruit juice that you have added water to (diluted). ? Low-calorie sports drinks.  Avoid: ? Alcohol. ? Drinks that have a lot of sugar. These include high-calorie sports drinks, fruit juice that does not have water added, and soda. ? Caffeine. ? Foods that are greasy or have a lot of fat or sugar.  Take over-the-counter and prescription medicines only as told by your doctor.  Do not take salt tablets. Doing that can make the salt level in your body get too high (hypernatremia).  Eat foods that have minerals (electrolytes). Examples include bananas, oranges, potatoes, tomatoes, and spinach.  Keep all follow-up visits as told by your doctor. This is important. Contact a doctor if:  You have belly (abdominal) pain that: ? Gets worse. ? Stays in one area (localizes).  You have a rash.  You have a stiff neck.  You get angry or annoyed more easily than normal (irritability).  You are more sleepy than normal.  You have a harder time waking up than normal.  You feel: ? Weak. ? Dizzy. ? Very thirsty.  You have peed (urinated) only a small amount of very dark pee during 6-8 hours. Get help right away if:  You have   symptoms of very bad dehydration.  You cannot drink fluids without throwing up (vomiting).  Your symptoms get worse with treatment.  You have a fever.  You have a very bad headache.  You are throwing up or having watery poop (diarrhea) and it: ? Gets worse. ? Does not go away.  You have blood or something green (bile) in your throw-up.  You have blood in your poop (stool). This may cause poop to look black and tarry.  You have not peed in 6-8 hours.  You pass out (faint).  Your heart rate when you are sitting still is more than 100 beats a  minute.  You have trouble breathing. This information is not intended to replace advice given to you by your health care provider. Make sure you discuss any questions you have with your health care provider. Document Released: 07/18/2009 Document Revised: 04/10/2016 Document Reviewed: 11/15/2015 Elsevier Interactive Patient Education  2019 [employer].  

## 2018-10-19 ENCOUNTER — Inpatient Hospital Stay: Payer: Medicaid Other

## 2018-10-19 VITALS — BP 140/80 | HR 84 | Temp 98.8°F | Resp 16

## 2018-10-19 DIAGNOSIS — C760 Malignant neoplasm of head, face and neck: Secondary | ICD-10-CM

## 2018-10-19 DIAGNOSIS — Z95828 Presence of other vascular implants and grafts: Secondary | ICD-10-CM

## 2018-10-19 DIAGNOSIS — Z5111 Encounter for antineoplastic chemotherapy: Secondary | ICD-10-CM | POA: Diagnosis not present

## 2018-10-19 DIAGNOSIS — C7800 Secondary malignant neoplasm of unspecified lung: Secondary | ICD-10-CM

## 2018-10-19 MED ORDER — ANTICOAGULANT SODIUM CITRATE 4% (200MG/5ML) IV SOLN
5.0000 mL | Freq: Once | Status: AC
  Administered 2018-10-19: 5 mL
  Filled 2018-10-19: qty 5

## 2018-10-19 MED ORDER — SODIUM CHLORIDE 0.9 % IV SOLN
Freq: Once | INTRAVENOUS | Status: AC
  Administered 2018-10-19: 15:00:00 via INTRAVENOUS
  Filled 2018-10-19: qty 250

## 2018-10-19 MED ORDER — SODIUM CHLORIDE 0.9% FLUSH
10.0000 mL | Freq: Once | INTRAVENOUS | Status: AC | PRN
  Administered 2018-10-19: 10 mL
  Filled 2018-10-19: qty 10

## 2018-10-19 NOTE — Patient Instructions (Signed)
Dehydration, Adult  Dehydration is when there is not enough fluid or water in your body. This happens when you lose more fluids than you take in. Dehydration can range from mild to very bad. It should be treated right away to keep it from getting very bad. Symptoms of mild dehydration may include:  Thirst.  Dry lips.  Slightly dry mouth.  Dry, warm skin.  Dizziness. Symptoms of moderate dehydration may include:  Very dry mouth.  Muscle cramps.  Dark pee (urine). Pee may be the color of tea.  Your body making less pee.  Your eyes making fewer tears.  Heartbeat that is uneven or faster than normal (palpitations).  Headache.  Light-headedness, especially when you stand up from sitting.  Fainting (syncope). Symptoms of very bad dehydration may include:  Changes in skin, such as: ? Cold and clammy skin. ? Blotchy (mottled) or pale skin. ? Skin that does not quickly return to normal after being lightly pinched and let go (poor skin turgor).  Changes in body fluids, such as: ? Feeling very thirsty. ? Your eyes making fewer tears. ? Not sweating when body temperature is high, such as in hot weather. ? Your body making very little pee.  Changes in vital signs, such as: ? Weak pulse. ? Pulse that is more than 100 beats a minute when you are sitting still. ? Fast breathing. ? Low blood pressure.  Other changes, such as: ? Sunken eyes. ? Cold hands and feet. ? Confusion. ? Lack of energy (lethargy). ? Trouble waking up from sleep. ? Short-term weight loss. ? Unconsciousness. Follow these instructions at home:   If told by your doctor, drink an ORS: ? Make an ORS by using instructions on the package. ? Start by drinking small amounts, about  cup (120 mL) every 5-10 minutes. ? Slowly drink more until you have had the amount that your doctor said to have.  Drink enough clear fluid to keep your pee clear or pale yellow. If you were told to drink an ORS, finish the  ORS first, then start slowly drinking clear fluids. Drink fluids such as: ? Water. Do not drink only water by itself. Doing that can make the salt (sodium) level in your body get too low (hyponatremia). ? Ice chips. ? Fruit juice that you have added water to (diluted). ? Low-calorie sports drinks.  Avoid: ? Alcohol. ? Drinks that have a lot of sugar. These include high-calorie sports drinks, fruit juice that does not have water added, and soda. ? Caffeine. ? Foods that are greasy or have a lot of fat or sugar.  Take over-the-counter and prescription medicines only as told by your doctor.  Do not take salt tablets. Doing that can make the salt level in your body get too high (hypernatremia).  Eat foods that have minerals (electrolytes). Examples include bananas, oranges, potatoes, tomatoes, and spinach.  Keep all follow-up visits as told by your doctor. This is important. Contact a doctor if:  You have belly (abdominal) pain that: ? Gets worse. ? Stays in one area (localizes).  You have a rash.  You have a stiff neck.  You get angry or annoyed more easily than normal (irritability).  You are more sleepy than normal.  You have a harder time waking up than normal.  You feel: ? Weak. ? Dizzy. ? Very thirsty.  You have peed (urinated) only a small amount of very dark pee during 6-8 hours. Get help right away if:  You have   symptoms of very bad dehydration.  You cannot drink fluids without throwing up (vomiting).  Your symptoms get worse with treatment.  You have a fever.  You have a very bad headache.  You are throwing up or having watery poop (diarrhea) and it: ? Gets worse. ? Does not go away.  You have blood or something green (bile) in your throw-up.  You have blood in your poop (stool). This may cause poop to look black and tarry.  You have not peed in 6-8 hours.  You pass out (faint).  Your heart rate when you are sitting still is more than 100 beats a  minute.  You have trouble breathing. This information is not intended to replace advice given to you by your health care provider. Make sure you discuss any questions you have with your health care provider. Document Released: 07/18/2009 Document Revised: 04/10/2016 Document Reviewed: 11/15/2015 Elsevier Interactive Patient Education  2019 [employer].  

## 2018-10-20 ENCOUNTER — Telehealth: Payer: Self-pay

## 2018-10-20 NOTE — Telephone Encounter (Signed)
Called and given below message. He verbalized understanding. He is having a bm everyday but the stool is so hard and painful.

## 2018-10-20 NOTE — Progress Notes (Signed)
[facility] Progress Note   Christian Simmons [id number] [birth year] 33 y.o.  Orlando Devereux is managed by Dr. [name]  Actively treated with chemotherapy/immunotherapy/hormonal therapy: yes  Current Therapy: Gemcitabine  Last Treated: 10/14/2018 (cycle 1, day 8)  Assessment: Plan:    Nausea without vomiting  Anxiety  Primary cancer of head and neck (HCC)   Nausea: The patient was given Phenergan IV today.  Anxiety: The patient became anxious after he was given Phenergan.  He was tachycardic and made comments such as "I keep forgetting how to talk."  He was reassured and was offered Ativan which he declined.  He was placed in the bed and fell asleep.  He appeared better after awakening.  Carcinoma of the head neck: The patient continues to be followed by Dr. [name] and is status post cycle 1, day 8 of gemcitabine which was dosed on 10/14/2018.  Please see After Visit Summary for patient specific instructions.  Future Appointments  Date Time Provider [place]  10/27/2018 11:45 AM [facility] LAB 2 [facility] None  10/27/2018 12:15 PM Gorsuch, Ni, MD [facility] None  10/28/2018  8:00 AM [facility] INFUSION [facility] None  10/29/2018 10:00 AM [facility] INFUSION [facility] None  10/31/2018  2:00 PM [facility] INFUSION [facility] None  11/01/2018  2:30 PM [facility] INFUSION [facility] None  11/02/2018  2:30 PM [facility] INFUSION [facility] None  11/03/2018  2:30 PM [facility] INFUSION [facility] None  11/04/2018  8:30 AM [facility] LAB 4 [facility] None  11/04/2018  8:45 AM [facility] [place] FLUSH [facility] None  11/04/2018  9:30 AM Gorsuch, Ni, MD [facility] None  11/04/2018 10:00 AM [facility] INFUSION [facility] None  11/05/2018 10:00 AM [facility] INFUSION [facility] None    No orders of the defined types were placed in this encounter.      Subjective:   Patient ID:  Autumn Gunn is a 33 y.o. (DOB 07-Oct-1985)  male.  Chief Complaint: No chief complaint on file.   HPI Lucah Petta   is a 33 year old male with a history of a metastatic carcinoma the head neck who is managed by Dr. [name] and is status post cycle 1, day 8 of single agent gemcitabine.  He was seen in the infusion room today after he was given Phenergan for nausea.  He became anxious and was noted to be tachycardic.  He made comments such as "I keep forgetting how to talk" after he was dosed with Phenergan.  He was offered Ativan which she declined.  He was placed in the bed in the infusion room and appeared better after sleeping.  Medications: I have reviewed the patient's current medications.  Allergies:  Allergies  Allergen Reactions  . Phenergan [Promethazine Hcl] Anxiety    Causes patients HR to go into the 140's with severe anxiety  . Heparin Other (See Comments)    No Pork derivatives due to religion  . Clindamycin Rash    Past Medical History:  Diagnosis Date  . Arrhythmia 12/25/2015  . Carcinoma ([place])   . Fatigue 06/01/2014  . Hypothyroidism   . Infection of eyelash follicle of left eye 0/35/0093  . 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 Neuropathy   . Radiation 07/18/14-07/31/14   Left upper lobe  40 gy in 10 fractions  . 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 LUNG REMOVAL, PARTIAL  05/03/2012   left upper lobectomy  .  nasal biopsy    . RADICAL NECK DISSECTION    . VIDEO BRONCHOSCOPY N/A 06/29/2014   Procedure: VIDEO BRONCHOSCOPY ;  Surgeon: Melrose Nakayama, MD;  Location: [facility] OR;  Service: Thoracic;  Laterality: N/A;    Family History  Problem Relation Age of Onset  . Hypertension Mother   . Diabetes Mother   . Hypertension Father   . Diabetes Father     Social History   Socioeconomic History  . Marital status: Married    Spouse name: Not on file  . Number of children: 1  . Years of  education: Not on file  . Highest education level: Not on file  Occupational History  . Not on file  Social Needs  . Financial resource strain: Not on file  . Food insecurity:    Worry: Not on file    Inability: Not on file  . Transportation needs:    Medical: Not on file    Non-medical: Not on file  Tobacco Use  . Smoking status: Never Smoker  . Smokeless tobacco: Never Used  Substance and Sexual Activity  . Alcohol use: No  . Drug use: No  . Sexual activity: Not on file  Lifestyle  . Physical activity:    Days per week: Not on file    Minutes per session: Not on file  . Stress: Not on file  Relationships  . Social connections:    Talks on phone: Not on file    Gets together: Not on file    Attends religious service: Not on file    Active member of club or organization: Not on file    Attends meetings of clubs or organizations: Not on file    Relationship status: Not on file  . Intimate partner violence:    Fear of current or ex partner: Not on file    Emotionally abused: Not on file    Physically abused: Not on file    Forced sexual activity: Not on file  Other Topics Concern  . Not on file  Social History Narrative   ** Merged History Encounter **        Past Medical History, Surgical history, Social history, and Family history were reviewed and updated as appropriate.   Please see review of systems for further details on the patient's review from today.   Review of Systems:  Review of Systems  Constitutional: Negative for appetite change, chills, diaphoresis and fever.  HENT: Negative for dental problem, mouth sores and trouble swallowing.   Respiratory: Negative for cough, chest tightness and shortness of breath.   Cardiovascular: Negative for chest pain and palpitations.  Gastrointestinal: Negative for constipation, diarrhea, nausea and vomiting.  Neurological: Negative for dizziness, syncope, weakness and headaches.  Psychiatric/Behavioral: Positive for  confusion. The patient is nervous/anxious.     Objective:   Physical Exam:  There were no vitals taken for this visit. ECOG: 0  Physical Exam Constitutional:      Appearance: He is not ill-appearing.  HENT:     Head: Normocephalic and atraumatic.  Cardiovascular:     Rate and Rhythm: Tachycardia present.     Heart sounds: S1 normal and S2 normal.  Pulmonary:     Effort: Pulmonary effort is normal. No respiratory distress.     Breath sounds: Normal breath sounds. No wheezing or rales.  Psychiatric:        Mood and Affect: Mood is anxious.     Lab Review:     Component  Value Date/Time   NA 136 10/14/2018 1217   NA 139 09/22/2017 0829   K 4.3 10/14/2018 1217   K 3.5 09/22/2017 0829   CL 99 10/14/2018 1217   CO2 28 10/14/2018 1217   CO2 26 09/22/2017 0829   GLUCOSE 119 (H) 10/14/2018 1217   GLUCOSE 133 09/22/2017 0829   BUN 17 10/14/2018 1217   BUN 14.1 09/22/2017 0829   CREATININE 1.04 10/14/2018 1217   CREATININE 0.9 09/22/2017 0829   CALCIUM 9.1 10/14/2018 1217   CALCIUM 9.1 09/22/2017 0829   PROT 7.0 10/14/2018 1217   PROT 6.8 09/22/2017 0829   ALBUMIN 4.0 10/14/2018 1217   ALBUMIN 4.1 09/22/2017 0829   AST 24 10/14/2018 1217   AST 22 09/22/2017 0829   ALT 53 (H) 10/14/2018 1217   ALT 30 09/22/2017 0829   ALKPHOS 67 10/14/2018 1217   ALKPHOS 55 09/22/2017 0829   BILITOT 0.2 (L) 10/14/2018 1217   BILITOT 0.35 09/22/2017 0829   GFRNONAA >60 10/14/2018 1217   GFRAA >60 10/14/2018 1217       Component Value Date/Time   WBC 6.3 10/14/2018 1217   RBC 4.47 10/14/2018 1217   HGB 13.3 10/14/2018 1217   HGB 14.1 09/22/2017 0829   HCT 38.9 (L) 10/14/2018 1217   HCT 41.2 09/22/2017 0829   PLT 158 10/14/2018 1217   PLT 176 09/22/2017 0829   MCV 87.0 10/14/2018 1217   MCV 90.2 09/22/2017 0829   MCH 29.8 10/14/2018 1217   MCHC 34.2 10/14/2018 1217   RDW 12.9 10/14/2018 1217   RDW 13.5 09/22/2017 0829   LYMPHSABS 1.5 10/14/2018 1217   LYMPHSABS 1.4  09/22/2017 0829   MONOABS 0.4 10/14/2018 1217   MONOABS 0.6 09/22/2017 0829   EOSABS 0.1 10/14/2018 1217   EOSABS 0.1 09/22/2017 0829   BASOSABS 0.0 10/14/2018 1217   BASOSABS 0.0 09/22/2017 0829   -------------------------------  Imaging from last 24 hours (if applicable):  Radiology interpretation: Dg Chest 2 View  Result Date: 10/09/2018 CLINICAL DATA:  Chest pain. Anxiety. 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 EXAM: CHEST - 2 VIEW COMPARISON:  PET-CT 10/04/2018 FINDINGS: Right chest port with tip at the atrial caval junction. Heart is normal in size. Normal mediastinal contours. Left upper lobe scarring. No acute airspace disease. No pleural effusion or pneumothorax. No pulmonary edema. Small pulmonary nodule on CT is not seen radiographically. No acute osseous abnormalities are seen. IMPRESSION: No acute chest findings.  Left upper lobe scarring. Electronically Signed   By: Keith Rake M.D.   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 EXAM: NUCLEAR MEDICINE PET SKULL BASE TO THIGH TECHNIQUE: 9.55 mCi F-18 FDG was injected intravenously. Full-ring PET imaging was performed from the skull base to thigh after the radiotracer. CT data was obtained and used for attenuation correction and anatomic localization. Fasting blood glucose: 106 mg/dl COMPARISON:  PET-CT 05/11/2018 and 06/03/2017.  Neck CT 05/19/2017. FINDINGS: Mediastinal blood pool activity: SUV max 2.2 NECK: There is an enlarging hypermetabolic mass in the mid right neck laterally, which is exophytic and measuring up to 3.4 x 2.9 cm on image 29/4. This has an SUV max of 22.2 (previously 8.1). This mass involves the right sternocleidomastoid muscle. No new or enlarging hypermetabolic nodal masses are identified. There is a stable mildly hypermetabolic nodule in  the left suprasternal notch, measuring 9  x 16 mm on image 52 and demonstrating an SUV max of 3.5.There are no lesions of the pharyngeal mucosal space. Incidental CT findings: none CHEST: There are no hypermetabolic mediastinal or hilar lymph nodes. There are small nodes inferiorly in the right axilla which have mildly enlarged compared with the prior study (measuring up to 8 mm on image 76/4) and are mildly hypermetabolic (SUV max 3.7). No suspicious pulmonary activity. Incidental CT findings: Stable radiation changes in both upper lobes. Stable subpleural 5 mm left lower lobe nodule on image 33/8. ABDOMEN/PELVIS: There is no hypermetabolic activity within the liver, adrenal glands, spleen or pancreas. There is no hypermetabolic nodal activity. Incidental CT findings: none SKELETON: There is no hypermetabolic activity to suggest osseous metastatic disease. Decreased activity in the upper thoracic spine attributed to previous radiation therapy. Incidental CT findings: none IMPRESSION: 1. Significant progression of local recurrence laterally in the mid right neck with an enlarging, increasingly hypermetabolic soft tissue mass. This involves the right sternocleidomastoid muscle. 2. Small lymph nodes in the right axilla are increasingly hypermetabolic. These are nonspecific and potentially reactive, although could reflect a small metastases. Small hypermetabolic nodule in the left suprasternal notch is unchanged. 3. No other evidence of metastatic disease. Electronically Signed   By: Richardean Sale M.D.   On: 10/04/2018 16:27

## 2018-10-20 NOTE — Telephone Encounter (Signed)
-----   Message from [name], MD sent at 10/20/2018  7:51 AM EST ----- Regarding: constipation I need you to advise him colace 100 mg BID, sennokot 2 tabs 3 x daily and miralax daily If still no BM by tomorrow, call use back

## 2018-10-21 ENCOUNTER — Telehealth: Payer: Self-pay

## 2018-10-21 NOTE — Telephone Encounter (Signed)
He called and left a message. He has a dentist appt and they cant to extract his wisdom teeth.  Called back. Left message per Dr. Alvy Bimler, do not have any dental work or extractions while getting treatment. Left message to call the office if needed.

## 2018-10-27 ENCOUNTER — Inpatient Hospital Stay (HOSPITAL_BASED_OUTPATIENT_CLINIC_OR_DEPARTMENT_OTHER): Payer: Medicaid Other | Admitting: Hematology and Oncology

## 2018-10-27 ENCOUNTER — Inpatient Hospital Stay: Payer: Medicaid Other

## 2018-10-27 DIAGNOSIS — C760 Malignant neoplasm of head, face and neck: Secondary | ICD-10-CM

## 2018-10-27 DIAGNOSIS — C7800 Secondary malignant neoplasm of unspecified lung: Secondary | ICD-10-CM

## 2018-10-27 DIAGNOSIS — R112 Nausea with vomiting, unspecified: Secondary | ICD-10-CM | POA: Diagnosis not present

## 2018-10-27 DIAGNOSIS — C119 Malignant neoplasm of nasopharynx, unspecified: Secondary | ICD-10-CM

## 2018-10-27 DIAGNOSIS — G893 Neoplasm related pain (acute) (chronic): Secondary | ICD-10-CM

## 2018-10-27 DIAGNOSIS — E039 Hypothyroidism, unspecified: Secondary | ICD-10-CM | POA: Diagnosis not present

## 2018-10-27 DIAGNOSIS — Z5111 Encounter for antineoplastic chemotherapy: Secondary | ICD-10-CM | POA: Diagnosis not present

## 2018-10-27 LAB — MAGNESIUM: Magnesium: 1.9 mg/dL (ref 1.7–2.4)

## 2018-10-27 LAB — CBC WITH DIFFERENTIAL/PLATELET
Abs Immature Granulocytes: 0.11 10*3/uL — ABNORMAL HIGH (ref 0.00–0.07)
BASOS PCT: 1 %
Basophils Absolute: 0 10*3/uL (ref 0.0–0.1)
Eosinophils Absolute: 0.2 10*3/uL (ref 0.0–0.5)
Eosinophils Relative: 5 %
HCT: 36.8 % — ABNORMAL LOW (ref 39.0–52.0)
Hemoglobin: 12.1 g/dL — ABNORMAL LOW (ref 13.0–17.0)
Immature Granulocytes: 3 %
Lymphocytes Relative: 30 %
Lymphs Abs: 1.4 10*3/uL (ref 0.7–4.0)
MCH: 29.6 pg (ref 26.0–34.0)
MCHC: 32.9 g/dL (ref 30.0–36.0)
MCV: 90 fL (ref 80.0–100.0)
Monocytes Absolute: 0.7 10*3/uL (ref 0.1–1.0)
Monocytes Relative: 15 %
NEUTROS PCT: 46 %
Neutro Abs: 2.1 10*3/uL (ref 1.7–7.7)
PLATELETS: 428 10*3/uL — AB (ref 150–400)
RBC: 4.09 MIL/uL — ABNORMAL LOW (ref 4.22–5.81)
RDW: 14.4 % (ref 11.5–15.5)
WBC: 4.4 10*3/uL (ref 4.0–10.5)
nRBC: 0.5 % — ABNORMAL HIGH (ref 0.0–0.2)

## 2018-10-27 LAB — COMPREHENSIVE METABOLIC PANEL
ALT: 36 U/L (ref 0–44)
AST: 23 U/L (ref 15–41)
Albumin: 3.8 g/dL (ref 3.5–5.0)
Alkaline Phosphatase: 62 U/L (ref 38–126)
Anion gap: 11 (ref 5–15)
BUN: 11 mg/dL (ref 6–20)
CO2: 29 mmol/L (ref 22–32)
Calcium: 9.3 mg/dL (ref 8.9–10.3)
Chloride: 101 mmol/L (ref 98–111)
Creatinine, Ser: 1.08 mg/dL (ref 0.61–1.24)
GFR calc Af Amer: >60 mL/min (ref >60)
GFR calc non Af Amer: >60 mL/min (ref >60)
Glucose, Bld: 166 mg/dL — ABNORMAL HIGH (ref 70–99)
Potassium: 4 mmol/L (ref 3.5–5.1)
Sodium: 141 mmol/L (ref 135–145)
Total Bilirubin: <0.2 mg/dL — ABNORMAL LOW (ref 0.3–1.2)
Total Protein: 6.7 g/dL (ref 6.5–8.1)

## 2018-10-27 LAB — TSH: TSH: 2.332 u[IU]/mL (ref 0.320–4.118)

## 2018-10-27 MED ORDER — OXYCODONE HCL 15 MG PO TABS: 15.0000 mg | ORAL_TABLET | ORAL | 0 refills | Status: DC | PRN

## 2018-10-28 ENCOUNTER — Inpatient Hospital Stay: Payer: Medicaid Other

## 2018-10-28 ENCOUNTER — Encounter: Payer: Self-pay | Admitting: Hematology and Oncology

## 2018-10-28 VITALS — BP 129/81 | HR 84 | Temp 98.4°F | Resp 18

## 2018-10-28 DIAGNOSIS — Z5111 Encounter for antineoplastic chemotherapy: Secondary | ICD-10-CM | POA: Diagnosis not present

## 2018-10-28 DIAGNOSIS — Z95828 Presence of other vascular implants and grafts: Secondary | ICD-10-CM

## 2018-10-28 DIAGNOSIS — Z7189 Other specified counseling: Secondary | ICD-10-CM

## 2018-10-28 DIAGNOSIS — C7800 Secondary malignant neoplasm of unspecified lung: Secondary | ICD-10-CM

## 2018-10-28 DIAGNOSIS — C760 Malignant neoplasm of head, face and neck: Secondary | ICD-10-CM

## 2018-10-28 MED ORDER — POTASSIUM CHLORIDE 2 MEQ/ML IV SOLN
Freq: Once | INTRAVENOUS | Status: AC
  Administered 2018-10-28: 10:00:00 via INTRAVENOUS
  Filled 2018-10-28: qty 10

## 2018-10-28 MED ORDER — SODIUM CHLORIDE 0.9 % IV SOLN
Freq: Once | INTRAVENOUS | Status: AC
  Administered 2018-10-28: 12:00:00 via INTRAVENOUS
  Filled 2018-10-28: qty 5

## 2018-10-28 MED ORDER — PALONOSETRON HCL INJECTION 0.25 MG/5ML
0.2500 mg | Freq: Once | INTRAVENOUS | Status: AC
  Administered 2018-10-28: 0.25 mg via INTRAVENOUS

## 2018-10-28 MED ORDER — SODIUM CHLORIDE 0.9 % IV SOLN
2000.0000 mg | Freq: Once | INTRAVENOUS | Status: AC
  Administered 2018-10-28: 2000 mg via INTRAVENOUS
  Filled 2018-10-28: qty 52.6

## 2018-10-28 MED ORDER — PALONOSETRON HCL INJECTION 0.25 MG/5ML
INTRAVENOUS | Status: AC
  Filled 2018-10-28: qty 5

## 2018-10-28 MED ORDER — SODIUM CHLORIDE 0.9% FLUSH
10.0000 mL | INTRAVENOUS | Status: DC | PRN
  Administered 2018-10-28: 10 mL
  Filled 2018-10-28: qty 10

## 2018-10-28 MED ORDER — SODIUM CHLORIDE 0.9 % IV SOLN
80.0000 mg/m2 | Freq: Once | INTRAVENOUS | Status: AC
  Administered 2018-10-28: 162 mg via INTRAVENOUS
  Filled 2018-10-28: qty 162

## 2018-10-28 MED ORDER — ANTICOAGULANT SODIUM CITRATE 4% (200MG/5ML) IV SOLN
5.0000 mL | Freq: Once | Status: AC
  Administered 2018-10-28: 5 mL
  Filled 2018-10-28: qty 5

## 2018-10-28 MED ORDER — SODIUM CHLORIDE 0.9 % IV SOLN
Freq: Once | INTRAVENOUS | Status: AC
  Administered 2018-10-28: 09:00:00 via INTRAVENOUS
  Filled 2018-10-28: qty 250

## 2018-10-28 NOTE — Assessment & Plan Note (Signed)
His recent thyroid function is stable.  He will continue thyroid replacement therapy

## 2018-10-28 NOTE — Patient Instructions (Signed)
[facility] Discharge Instructions for Patients Receiving Chemotherapy  Today you received the following chemotherapy agents Gemzar/Cisplatin To help prevent nausea and vomiting after your treatment, we encourage you to take your nausea medication as prescribed.  If you develop nausea and vomiting that is not controlled by your nausea medication, call the clinic.   BELOW ARE SYMPTOMS THAT SHOULD BE REPORTED IMMEDIATELY:  *FEVER GREATER THAN 100.5 F  *CHILLS WITH OR WITHOUT FEVER  NAUSEA AND VOMITING THAT IS NOT CONTROLLED WITH YOUR NAUSEA MEDICATION  *UNUSUAL SHORTNESS OF BREATH  *UNUSUAL BRUISING OR BLEEDING  TENDERNESS IN MOUTH AND THROAT WITH OR WITHOUT PRESENCE OF ULCERS  *URINARY PROBLEMS  *BOWEL PROBLEMS  UNUSUAL RASH Items with * indicate a potential emergency and should be followed up as soon as possible.  Feel free to call the clinic should you have any questions or concerns. The clinic phone number is [phone].  Please show the [facility] at check-in to the Emergency Department and triage nurse.

## 2018-10-28 NOTE — Assessment & Plan Note (Signed)
His cancer pain has resolved since chemotherapy.  He will take oxycodone as needed

## 2018-10-28 NOTE — Assessment & Plan Note (Signed)
He has no recent nausea and vomiting since I saw him.  He will continue IV fluid hydration therapy along with IV antiemetics as needed daily for a week to prevent complications from treatment

## 2018-10-28 NOTE — Progress Notes (Signed)
[facility] OFFICE PROGRESS NOTE  Patient Care Team: Patient, No Pcp Per as PCP - General (General Practice) [name], MD as Consulting Physician (Hematology and Oncology) Eppie Gibson, MD as Attending Physician (Radiation Oncology) Jodi Marble, MD as Consulting Physician (Otolaryngology) Philomena Doheny, MD as Referring Physician (Plastic Surgery) Irene Shipper, MD as Consulting Physician (Gastroenterology) System, Provider Not In  ASSESSMENT & PLAN:  Primary cancer of head and neck [facility]) He has excellent response to the first cycle of chemotherapy.  The abnormal tumor lesion on the right side of his neck has almost completely disappeared. We will proceed with cycle 2 of chemotherapy as scheduled with additional IV fluids appointment for supportive care, due to prior history of severe nausea, vomiting and dehydration I recommend minimum 3-4 cycles of treatment before repeat imaging study  Cancer associated pain His cancer pain has resolved since chemotherapy.  He will take oxycodone as needed  Nausea and vomiting He has no recent nausea and vomiting since I saw him.  He will continue IV fluid hydration therapy along with IV antiemetics as needed daily for a week to prevent complications from treatment  Acquired hypothyroidism His recent thyroid function is stable.  He will continue thyroid replacement therapy   No orders of the defined types were placed in this encounter.   INTERVAL HISTORY: Please see below for problem oriented charting. He returns for further follow-up and chemotherapy He felt better since last time I saw him He is eating well and gaining weight No recurrent nausea, vomiting, constipation or diarrhea The skin lesion on the right side of his neck has almost completely disappeared. He denies peripheral neuropathy from  therapy His cancer pain is stable with current prescription oxycodone.  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 Pan-endoscopy elsewhere showed cancer from nasopharyngeal space.    01/04/2007 - 02/20/2007 Chemotherapy    He received 2 cycles of cisplatin and 5FU followed by concurrent chemo with weekly cisplatin and radiation. He only received 2 doses of chemo due to severe mucositis, nausea and weight loss.    04/05/2007 - 08/04/2007 Chemotherapy    He received 4 more courses of cisplatin with 5FU and had complete response    07/05/2009 Procedure    Fine-needle aspirate of the right level II lymph nodes come from recurrent metastatic disease. Repeat endoscopy and CT scan show no evidence of disease elsewhere.    07/08/2009 - 12/02/2009 Chemotherapy    He was given 6 cycles of carboplatin, 5-FU and docetaxel    12/03/2009 Surgery    He has surgery to the residual lymph node on the right neck which showed no evidence of disease.    02/22/2012 Imaging    Repeat imaging study showed large recurrent mass. He was referred elsewhere for further treatment.    05/03/2012 Surgery    He underwent left upper lobectomy.    04/29/2013 Imaging  PEt scan showed lesion on right level II B and lower lung was abnormal    06/03/2013 - 02/02/2014 Chemotherapy    He had 6 cycles of chemotherapy when he was found to have recurrence of cancer and had received oxaliplatin and capecitabine    06/07/2014 Imaging    PET CT scan showed persistent disease in the right neck lymph nodes and left lung    06/29/2014 Procedure    Accession:  [id number] repeat LUL biopsy confirmed metastatic cancer    07/18/2014 - 07/31/2014 Radiation Therapy    He received palliative radiation therapy to the lungs    10/10/2014 Imaging    CT scan of the chest, abdomen and pelvis show regression in the size of the lung nodule in the left upper lobe and stable pulmonary nodules    01/24/2015 Imaging    CT scan showed stable disease in neck and lung    06/19/2015 Imaging    CT scan of the neck and the chest show possible mild progression of the nodule in the right side of the neck.    06/25/2015 Imaging    PET scan confirmed disease recurrence in the neck    07/07/2015 Imaging    He had [facility] neck [facility]    09/03/2015 - 08/26/2018 Chemotherapy    He received palliative chemo with Nivolumab    10/29/2015 Imaging    PET CT showed positive response to Rx    02/28/2016 Imaging    Ct abdomen showed abnormal thinkening in his stomach    03/03/2016 Imaging    CT: Right sternocleidomastoid muscle metastasis appears less distinct but otherwise not significantly changed in size or configuration since 06/19/2015.2. Left level 3 lymph node which was hypermetabolic by PET-CT in January 2017 appears slightly smaller    04/01/2016 Imaging    CT cervical spine showed no acute fracture or traumatic malalignment in the cervical spine    04/22/2016 Procedure    Port-a-cath placed.    06/16/2016 Imaging    Ct neck showed right sternocleidomastoid muscle metastasis is further decreased in conspicuity since May, and has mildly decreased in size since September 2016. Continued stability of sub-centimeter left cervical lymph nodes. No new or progressive metastatic disease in the neck.    06/16/2016 Imaging    CT chest showed stable masslike radiation fibrosis in the left upper lobe. Stable subcentimeter pulmonary nodules in the bilateral lower lobes. No new or progressive metastatic disease in the chest. Nonobstructing left renal stone.    10/13/2016 Imaging    Ct  neck showed unchanged right sternocleidomastoid muscle metastasis. Unchanged subcentimeter left cervical lymph nodes. No evidence of new or progressive metastatic disease in the neck.    10/13/2016 Imaging    CT chest showed tiny hypervascular foci in the liver, not definitely seen on prior imaging of 06/16/2016 and 02/28/2016. Abdomen MRI without and with contrast recommended to further evaluate as metastatic disease is a concern. 2. Stable appearance of post treatment changes left upper lung and scattered tiny bilateral pulmonary nodules.    02/11/2017 Imaging    Ct neck: Lymph node mass right posterior neck appears improved from the prior study. Small posterior lymph nodes on the left unchanged. Occluded right jugular vein unchanged.    02/11/2017 Imaging    1. Similar appearance of postsurgical and radiation changes in the left upper lobe. 2. Similar bilateral pulmonary nodules. 3. No thoracic adenopathy. 4. Subtle foci of post-contrast enhancement within the liver are suboptimally characterized on this nondedicated  study. Likely similar. These could either be re-evaluated at followup or more entirely characterized with abdominal MRI. 5. Left nephrolithiasis.    05/19/2017 Imaging    Matted lymph node mass right posterior neck appears larger in the recent CT. Accurate measurements difficult due to infiltrating tumor margins and infiltration of the muscle. Right jugular vein again appears occluded or resected. Small left posterior lymph nodes stable. Left upper lobe airspace density stable and similar to the prior CT    06/03/2017 PET scan    1. Hypermetabolic ill-defined right level IIb lymph node, about 1.3 cm in diameter with maximum SUV 9.5 (formerly 8.1). Appearance suspicious for residual/recurrent malignancy. No worrisome left-sided lesion. 2. Left suprahilar indistinct opacity demonstrates no worrisome hypermetabolic activity. The 5 mm left lower lobe pulmonary nodule is stable and not  currently hypermetabolic although below sensitive PET-CT size thresholds. 3. Other imaging findings of potential clinical significance: Bilateral nonobstructive nephrolithiasis. Chronic bilateral maxillary sinusitis.    05/11/2018 PET scan    1. Continued chronic accentuated metabolic activity in the vicinity of right level IIB and the adjacent right sternocleidomastoid muscle, with ill definition of surrounding tissue planes. Maximum SUV is currently 8.1, formerly 9.5. Accentuated metabolic activity is been present in this vicinity back through 06/25/2015, and there was also some low-level activity in this vicinity on 06/07/2014. Some of this may be from scarring and local muscular activity although clearly a component of residual tumor is difficult to exclude given the focally high activity. 2. Other imaging findings of potential clinical significance: Chronic bilateral maxillary sinusitis. Chronic scarring in the left upper lobe. Chronically stable 5 mm left lower lobe nodule is considered benign. Nonobstructive left nephrolithiasis.    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 COMMENT:No significant keratinization is identified. Other basaloid carcinomas are in the differential diagnosis. No cell block material is available for further testing.    09/12/2018 Procedure    He underwent fine Needle Aspiration    10/04/2018 PET scan    1. Significant progression of local recurrence laterally in the mid right neck with an enlarging, increasingly hypermetabolic soft tissue mass. This involves the right sternocleidomastoid muscle. 2. Small lymph nodes in the right axilla are increasingly hypermetabolic. These are nonspecific and potentially reactive, although could reflect a small metastases. Small hypermetabolic nodule in the left suprasternal notch is  unchanged. 3. No other evidence of metastatic disease.     10/07/2018 -  Chemotherapy    The patient had cisplatin plus gemzar     REVIEW OF SYSTEMS:   Constitutional: Denies fevers, chills or abnormal weight loss Eyes: Denies blurriness of vision Ears, nose, mouth, throat, and face: Denies mucositis or sore throat Respiratory: Denies cough, dyspnea or wheezes Cardiovascular: Denies palpitation, chest discomfort or lower extremity swelling Gastrointestinal:  Denies nausea, heartburn or change in bowel habits Skin: Denies abnormal skin rashes Lymphatics: Denies new lymphadenopathy or easy bruising Neurological:Denies numbness, tingling or new weaknesses Behavioral/Psych: Mood is stable, no new changes  All other systems were reviewed with the patient and are negative.  I have reviewed the past medical history, past surgical history, social history and family history with the patient and they are unchanged from previous note.  ALLERGIES:  is allergic to phenergan [promethazine hcl]; heparin; and clindamycin.  MEDICATIONS:  Current Outpatient Medications  Medication Sig Dispense Refill  . dexamethasone (DECADRON) 4 MG tablet Take 2  tablets by mouth after cisplatin chemotherapy for 2 days in the morning. Take with food. 30 tablet 1  . levothyroxine (SYNTHROID, LEVOTHROID) 137 MCG tablet TAKE 1 TABLET(137 MCG) BY MOUTH DAILY BEFORE BREAKFAST (Patient taking differently: Take 137 mcg by mouth daily before breakfast. ) 90 tablet 0  . lidocaine-prilocaine (EMLA) cream Apply 1 application topically as needed. (Patient not taking: Reported on 10/10/2018) 30 g 3  . LORazepam (ATIVAN) 0.5 MG tablet Take 2 tablets (1 mg total) by mouth 3 (three) times daily as needed for up to 3 doses for anxiety. 3 tablet 0  . omeprazole (PRILOSEC) 20 MG capsule Take 1 capsule (20 mg total) by mouth daily. 30 capsule 5  . ondansetron (ZOFRAN) 8 MG tablet Take 1 tablet (8 mg total) by mouth every 8 (eight) hours as  needed for nausea or vomiting. 90 tablet 1  . oxyCODONE (ROXICODONE) 15 MG immediate release tablet Take 1 tablet (15 mg total) by mouth every 4 (four) hours as needed for pain. 60 tablet 0  . polyethylene glycol (MIRALAX) packet Take 17 g by mouth daily. (Patient taking differently: Take 17 g by mouth daily as needed for mild constipation or moderate constipation. ) 14 each 3  . prochlorperazine (COMPAZINE) 10 MG tablet Take 1 tablet (10 mg total) by mouth every 6 (six) hours as needed (Nausea or vomiting). 30 tablet 1   No current facility-administered medications for this visit.    Facility-Administered Medications Ordered in Other Visits  Medication Dose Route Frequency Provider Last Rate Last Dose  . anticoagulant sodium citrate solution 5 mL  5 mL Intracatheter Once Alvy Bimler, Khamryn Calderone, MD      . CISplatin (PLATINOL) 162 mg in sodium chloride 0.9 % 500 mL chemo infusion  80 mg/m2 (Treatment Plan Recorded) Intravenous Once Alvy Bimler, Shloimy Michalski, MD      . fosaprepitant (EMEND) 150 mg, dexamethasone (DECADRON) 12 mg in sodium chloride 0.9 % 145 mL IVPB   Intravenous Once Alvy Bimler, Heaven Wandell, MD      . gemcitabine (GEMZAR) 2,014 mg in sodium chloride 0.9 % 250 mL chemo infusion  1,000 mg/m2 (Treatment Plan Recorded) Intravenous Once Alvy Bimler, Lando Alcalde, MD      . palonosetron (ALOXI) injection 0.25 mg  0.25 mg Intravenous Once Jovane Foutz, MD      . sodium chloride flush (NS) 0.9 % injection 10 mL  10 mL Intracatheter PRN Alvy Bimler, Mannie Ohlin, MD        PHYSICAL EXAMINATION: ECOG PERFORMANCE STATUS: 1 - Symptomatic but completely ambulatory  Vitals:   10/27/18 1211  BP: 113/71  Pulse: 90  Resp: 18  Temp: 98.1 F (36.7 C)  SpO2: 99%   Filed Weights   10/27/18 1211  Weight: 192 lb 3.2 oz (87.2 kg)    GENERAL:alert, no distress and comfortable SKIN: skin color, texture, turgor are normal, no rashes or significant lesions EYES: normal, Conjunctiva are pink and non-injected, sclera clear OROPHARYNX:no exudate, no erythema  and lips, buccal mucosa, and tongue normal  NECK: The right sided neck lesion has almost completely disappeared. LYMPH:  no palpable lymphadenopathy in the cervical, axillary or inguinal LUNGS: clear to auscultation and percussion with normal breathing effort HEART: regular rate & rhythm and no murmurs and no lower extremity edema ABDOMEN:abdomen soft, non-tender and normal bowel sounds Musculoskeletal:no cyanosis of digits and no clubbing  NEURO: alert & oriented x 3 with fluent speech, no focal motor/sensory deficits  LABORATORY DATA:  I have reviewed the data as listed    Component Value  Date/Time   NA 141 10/27/2018 1159   NA 139 09/22/2017 0829   K 4.0 10/27/2018 1159   K 3.5 09/22/2017 0829   CL 101 10/27/2018 1159   CO2 29 10/27/2018 1159   CO2 26 09/22/2017 0829   GLUCOSE 166 (H) 10/27/2018 1159   GLUCOSE 133 09/22/2017 0829   BUN 11 10/27/2018 1159   BUN 14.1 09/22/2017 0829   CREATININE 1.08 10/27/2018 1159   CREATININE 0.9 09/22/2017 0829   CALCIUM 9.3 10/27/2018 1159   CALCIUM 9.1 09/22/2017 0829   PROT 6.7 10/27/2018 1159   PROT 6.8 09/22/2017 0829   ALBUMIN 3.8 10/27/2018 1159   ALBUMIN 4.1 09/22/2017 0829   AST 23 10/27/2018 1159   AST 22 09/22/2017 0829   ALT 36 10/27/2018 1159   ALT 30 09/22/2017 0829   ALKPHOS 62 10/27/2018 1159   ALKPHOS 55 09/22/2017 0829   BILITOT <0.2 (L) 10/27/2018 1159   BILITOT 0.35 09/22/2017 0829   GFRNONAA >60 10/27/2018 1159   GFRAA >60 10/27/2018 1159    No results found for: SPEP, UPEP  Lab Results  Component Value Date   WBC 4.4 10/27/2018   NEUTROABS 2.1 10/27/2018   HGB 12.1 (L) 10/27/2018   HCT 36.8 (L) 10/27/2018   MCV 90.0 10/27/2018   PLT 428 (H) 10/27/2018      Chemistry      Component Value Date/Time   NA 141 10/27/2018 1159   NA 139 09/22/2017 0829   K 4.0 10/27/2018 1159   K 3.5 09/22/2017 0829   CL 101 10/27/2018 1159   CO2 29 10/27/2018 1159   CO2 26 09/22/2017 0829   BUN 11 10/27/2018 1159    BUN 14.1 09/22/2017 0829   CREATININE 1.08 10/27/2018 1159   CREATININE 0.9 09/22/2017 0829      Component Value Date/Time   CALCIUM 9.3 10/27/2018 1159   CALCIUM 9.1 09/22/2017 0829   ALKPHOS 62 10/27/2018 1159   ALKPHOS 55 09/22/2017 0829   AST 23 10/27/2018 1159   AST 22 09/22/2017 0829   ALT 36 10/27/2018 1159   ALT 30 09/22/2017 0829   BILITOT <0.2 (L) 10/27/2018 1159   BILITOT 0.35 09/22/2017 0829       RADIOGRAPHIC STUDIES: I have personally reviewed the radiological images as listed and agreed with the findings in the report. Dg Chest 2 View  Result Date: 10/09/2018 CLINICAL DATA:  Chest pain. Anxiety. 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 EXAM: CHEST - 2 VIEW COMPARISON:  PET-CT 10/04/2018 FINDINGS: Right chest port with tip at the atrial caval junction. Heart is normal in size. Normal mediastinal contours. Left upper lobe scarring. No acute airspace disease. No pleural effusion or pneumothorax. No pulmonary edema. Small pulmonary nodule on CT is not seen radiographically. No acute osseous abnormalities are seen. IMPRESSION: No acute chest findings.  Left upper lobe scarring. Electronically Signed   By: Keith Rake M.D.   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 EXAM: NUCLEAR MEDICINE PET SKULL BASE TO THIGH TECHNIQUE: 9.55 mCi F-18 FDG was injected intravenously. Full-ring PET imaging was performed from the skull base to thigh after the radiotracer. CT data was obtained and used for attenuation correction and anatomic localization. Fasting blood glucose: 106 mg/dl COMPARISON:  PET-CT 05/11/2018 and 06/03/2017.  Neck CT 05/19/2017. FINDINGS: Mediastinal blood pool activity: SUV max 2.2 NECK: There  is an enlarging hypermetabolic mass in the mid right neck laterally, which is exophytic and measuring up  to 3.4 x 2.9 cm on image 29/4. This has an SUV max of 22.2 (previously 8.1). This mass involves the right sternocleidomastoid muscle. No new or enlarging hypermetabolic nodal masses are identified. There is a stable mildly hypermetabolic nodule in the left suprasternal notch, measuring 9 x 16 mm on image 52 and demonstrating an SUV max of 3.5.There are no lesions of the pharyngeal mucosal space. Incidental CT findings: none CHEST: There are no hypermetabolic mediastinal or hilar lymph nodes. There are small nodes inferiorly in the right axilla which have mildly enlarged compared with the prior study (measuring up to 8 mm on image 76/4) and are mildly hypermetabolic (SUV max 3.7). No suspicious pulmonary activity. Incidental CT findings: Stable radiation changes in both upper lobes. Stable subpleural 5 mm left lower lobe nodule on image 33/8. ABDOMEN/PELVIS: There is no hypermetabolic activity within the liver, adrenal glands, spleen or pancreas. There is no hypermetabolic nodal activity. Incidental CT findings: none SKELETON: There is no hypermetabolic activity to suggest osseous metastatic disease. Decreased activity in the upper thoracic spine attributed to previous radiation therapy. Incidental CT findings: none IMPRESSION: 1. Significant progression of local recurrence laterally in the mid right neck with an enlarging, increasingly hypermetabolic soft tissue mass. This involves the right sternocleidomastoid muscle. 2. Small lymph nodes in the right axilla are increasingly hypermetabolic. These are nonspecific and potentially reactive, although could reflect a small metastases. Small hypermetabolic nodule in the left suprasternal notch is unchanged. 3. No other evidence of metastatic disease. Electronically Signed   By: Richardean Sale M.D.   On: 10/04/2018 16:27    All questions were answered. The patient knows to call the clinic with any problems, questions or concerns. No barriers to learning was  detected.  I spent 15 minutes counseling the patient face to face. The total time spent in the appointment was 20 minutes and more than 50% was on counseling and review of test results  [name], MD 10/28/2018 10:07 AM

## 2018-10-28 NOTE — Assessment & Plan Note (Signed)
He has excellent response to the first cycle of chemotherapy.  The abnormal tumor lesion on the right side of his neck has almost completely disappeared. We will proceed with cycle 2 of chemotherapy as scheduled with additional IV fluids appointment for supportive care, due to prior history of severe nausea, vomiting and dehydration I recommend minimum 3-4 cycles of treatment before repeat imaging study

## 2018-10-28 NOTE — Progress Notes (Signed)
Pt reports pain in his lower back that increases in intensity with urination. Pt also reports difficulty initiating stream. V Tanner made aware and assessed pt.

## 2018-10-29 ENCOUNTER — Inpatient Hospital Stay: Payer: Medicaid Other

## 2018-10-29 VITALS — BP 138/78 | HR 95 | Temp 98.1°F | Resp 16

## 2018-10-29 DIAGNOSIS — C760 Malignant neoplasm of head, face and neck: Secondary | ICD-10-CM

## 2018-10-29 DIAGNOSIS — Z95828 Presence of other vascular implants and grafts: Secondary | ICD-10-CM

## 2018-10-29 DIAGNOSIS — Z5111 Encounter for antineoplastic chemotherapy: Secondary | ICD-10-CM | POA: Diagnosis not present

## 2018-10-29 DIAGNOSIS — C7800 Secondary malignant neoplasm of unspecified lung: Secondary | ICD-10-CM

## 2018-10-29 MED ORDER — ONDANSETRON HCL 4 MG/2ML IJ SOLN
INTRAMUSCULAR | Status: AC
  Filled 2018-10-29: qty 4

## 2018-10-29 MED ORDER — SODIUM CHLORIDE 0.9 % IV SOLN
Freq: Once | INTRAVENOUS | Status: AC
  Administered 2018-10-29: 11:00:00 via INTRAVENOUS
  Filled 2018-10-29: qty 250

## 2018-10-29 MED ORDER — SODIUM CHLORIDE 0.9 % IV SOLN
Freq: Once | INTRAVENOUS | Status: AC
  Administered 2018-10-29: 11:00:00 via INTRAVENOUS

## 2018-10-29 MED ORDER — SODIUM CHLORIDE 0.9% FLUSH
10.0000 mL | Freq: Once | INTRAVENOUS | Status: AC | PRN
  Administered 2018-10-29: 10 mL
  Filled 2018-10-29: qty 10

## 2018-10-29 NOTE — Patient Instructions (Signed)

## 2018-10-29 NOTE — Progress Notes (Signed)
Pt requesting nausea medication.  Zofran in supportive care available.  Pt had aloxi on 10/28/18.  Called MD Gudena on call.  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  MD Lindi Adie voiced concern that IV Compazine would have same effect.  VO from MD Gudena to admin IV zofran despite recent aloxi admin unless pt wants to try IV ativan which pt refused d/t sedative properties.  Pt verbalized understanding and agreement to using Zofran.  Will continue to monitor.  VSS.  A&Ox4.  Ambulatory w/steady gait.  Currently no emesis occurrences.  D/t pt's inability to have heparin flushes pt will be deaccessed with saline flush only.  Pharmacy not available for sodium citrate flush.  Pt verbalized understanding and agreement.

## 2018-10-30 ENCOUNTER — Emergency Department (HOSPITAL_COMMUNITY): Payer: Medicaid Other

## 2018-10-30 ENCOUNTER — Encounter (HOSPITAL_COMMUNITY): Payer: Self-pay

## 2018-10-30 ENCOUNTER — Emergency Department (HOSPITAL_COMMUNITY)
Admission: EM | Admit: 2018-10-30 | Discharge: 2018-10-30 | Disposition: A | Discharge status: Home / Self Care | Payer: Medicaid Other | Attending: Emergency Medicine | Admitting: Emergency Medicine

## 2018-10-30 ENCOUNTER — Other Ambulatory Visit: Payer: Self-pay

## 2018-10-30 DIAGNOSIS — E039 Hypothyroidism, unspecified: Secondary | ICD-10-CM | POA: Insufficient documentation

## 2018-10-30 DIAGNOSIS — Z79899 Other long term (current) drug therapy: Secondary | ICD-10-CM | POA: Insufficient documentation

## 2018-10-30 DIAGNOSIS — R110 Nausea: Secondary | ICD-10-CM | POA: Insufficient documentation

## 2018-10-30 LAB — COMPREHENSIVE METABOLIC PANEL
ALK PHOS: 46 U/L (ref 38–126)
ALT: 43 U/L (ref 0–44)
AST: 32 U/L (ref 15–41)
Albumin: 4.1 g/dL (ref 3.5–5.0)
Anion gap: 11 (ref 5–15)
BUN: 19 mg/dL (ref 6–20)
CALCIUM: 8.6 mg/dL — AB (ref 8.9–10.3)
CO2: 25 mmol/L (ref 22–32)
Chloride: 97 mmol/L — ABNORMAL LOW (ref 98–111)
Creatinine, Ser: 0.89 mg/dL (ref 0.61–1.24)
GFR calc Af Amer: >60 mL/min (ref >60)
GFR calc non Af Amer: >60 mL/min (ref >60)
Glucose, Bld: 219 mg/dL — ABNORMAL HIGH (ref 70–99)
Potassium: 3.5 mmol/L (ref 3.5–5.1)
Sodium: 133 mmol/L — ABNORMAL LOW (ref 135–145)
Total Bilirubin: 0.5 mg/dL (ref 0.3–1.2)
Total Protein: 6.7 g/dL (ref 6.5–8.1)

## 2018-10-30 LAB — CBC WITH DIFFERENTIAL/PLATELET
Abs Immature Granulocytes: 0.05 10*3/uL (ref 0.00–0.07)
Basophils Absolute: 0 10*3/uL (ref 0.0–0.1)
Basophils Relative: 0 %
EOS PCT: 0 %
Eosinophils Absolute: 0 10*3/uL (ref 0.0–0.5)
HCT: 35.7 % — ABNORMAL LOW (ref 39.0–52.0)
Hemoglobin: 11.6 g/dL — ABNORMAL LOW (ref 13.0–17.0)
Immature Granulocytes: 1 %
Lymphocytes Relative: 5 %
Lymphs Abs: 0.4 10*3/uL — ABNORMAL LOW (ref 0.7–4.0)
MCH: 29.9 pg (ref 26.0–34.0)
MCHC: 32.5 g/dL (ref 30.0–36.0)
MCV: 92 fL (ref 80.0–100.0)
Monocytes Absolute: 0.7 10*3/uL (ref 0.1–1.0)
Monocytes Relative: 9 %
Neutro Abs: 6.6 10*3/uL (ref 1.7–7.7)
Neutrophils Relative %: 85 %
Platelets: 499 10*3/uL — ABNORMAL HIGH (ref 150–400)
RBC: 3.88 MIL/uL — ABNORMAL LOW (ref 4.22–5.81)
RDW: 14.3 % (ref 11.5–15.5)
WBC: 7.7 10*3/uL (ref 4.0–10.5)
nRBC: 0 % (ref 0.0–0.2)

## 2018-10-30 LAB — LIPASE, BLOOD: Lipase: 29 U/L (ref 11–51)

## 2018-10-30 MED ORDER — LACTATED RINGERS IV BOLUS
1000.0000 mL | Freq: Once | INTRAVENOUS | Status: AC
  Administered 2018-10-30: 1000 mL via INTRAVENOUS

## 2018-10-30 MED ORDER — LORAZEPAM 2 MG/ML IJ SOLN
1.0000 mg | Freq: Once | INTRAMUSCULAR | Status: AC
  Administered 2018-10-30: 1 mg via INTRAVENOUS
  Filled 2018-10-30: qty 1

## 2018-10-30 MED ORDER — HEPARIN SOD (PORK) LOCK FLUSH 100 UNIT/ML IV SOLN
500.0000 [IU] | Freq: Once | INTRAVENOUS | Status: AC
  Administered 2018-10-30: 500 [IU]
  Filled 2018-10-30: qty 5

## 2018-10-30 NOTE — ED Triage Notes (Signed)
Pt presents with several complaints. Pt is a cancer patient and reports that he had chemo on Friday and is now feeling very agitated and anxious and nauseated. Pt is pacing the room. Pt also c/o dehydration but denies any pain.

## 2018-10-30 NOTE — ED Provider Notes (Signed)
[facility] DEPT Provider Note   CSN: [id number] Arrival date & time: 10/30/18  1831     History   Chief Complaint Chief Complaint  Patient presents with  . Nausea  . Dehydration    HPI Christian Simmons is a 33 y.o. male.  HPI  33 year old male presents with restlessness and anxiety, in addition to nausea and feeling like he is dehydrated.  This is been ongoing all day today.  He states he is not been able to drink anything due to the nausea.  He is on Compazine and has tried it with no relief.  He feels like the anxiety/restlessness is the biggest contributor to his symptoms right now.  He feels short of breath but thinks is from anxiety.  No chest pain, headache or abdominal pain.  There is no diarrhea.  He last received chemotherapy 2 days ago and this is exactly what he felt like a couple days after his first treatment earlier this month.  Past Medical History:  Diagnosis Date  . Arrhythmia 12/25/2015  . Carcinoma ([place])   . Fatigue 06/01/2014  . Hypothyroidism   . Infection of eyelash follicle of left eye 7/41/2878  . 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 Neuropathy   . Radiation 07/18/14-07/31/14   Left upper lobe  40 gy in 10 fractions  . 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 Nausea and vomiting 10/14/2018  . Wound, open, neck, subsequent encounter 10/14/2018  . Oral thrush 10/14/2018  . Goals of care, counseling/discussion 09/21/2018  . Cancer associated pain 09/08/2018  . Financial difficulties 11/26/2017  . Viral URI with cough 10/22/2017  . Medically noncompliant 04/13/2017  . Vertigo, intermittent 11/02/2016  . Port catheter in place 04/01/2016  . Epigastric pain 02/10/2016  . Gastritis without bleeding 01/22/2016  . Arrhythmia 12/25/2015  . Infection of eyelash follicle of left eye 67/67/2094  . Poor dentition 10/16/2015  .  Other constipation 05/16/2015  . Neck pain on right side 01/28/2015  . 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 Acquired hypothyroidism 06/09/2014  . 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 Chronic fatigue 06/01/2014    Past Surgical History:  Procedure Laterality Date  . LUNG REMOVAL, PARTIAL  05/03/2012   left upper lobectomy  . nasal biopsy    . RADICAL NECK DISSECTION    . VIDEO BRONCHOSCOPY N/A 06/29/2014   Procedure: VIDEO BRONCHOSCOPY ;  Surgeon: Melrose Nakayama, MD;  Location: [facility];  Service: Thoracic;  Laterality: N/A;        Home Medications    Prior to Admission medications   Medication Sig Start Date End Date Taking? Authorizing Provider  dexamethasone (DECADRON) 4 MG tablet Take 2 tablets by mouth after cisplatin chemotherapy for 2 days in the morning. Take with food. 10/06/18  Yes Gorsuch, Ni, MD  levothyroxine (SYNTHROID, LEVOTHROID) 137 MCG tablet TAKE 1 TABLET(137 MCG) BY MOUTH DAILY BEFORE BREAKFAST Patient taking differently: Take 137 mcg by mouth daily before breakfast.  10/06/18  Yes Gorsuch, Ni, MD  LORazepam (ATIVAN) 0.5 MG tablet Take 2 tablets (1 mg total) by mouth 3 (three) times daily as needed for up to 3 doses for anxiety. 10/10/18  Yes Couture, Cortni S, PA-C  omeprazole (PRILOSEC) 20 MG capsule Take 1 capsule (20 mg total) by  mouth daily. 10/18/18  Yes Tanner, Lyndon Code., PA-C  ondansetron (ZOFRAN) 8 MG tablet Take 1 tablet (8 mg total) by mouth every 8 (eight) hours as needed for nausea or vomiting. 10/14/18  Yes Gorsuch, Ni, MD  oxyCODONE (ROXICODONE) 15 MG immediate release tablet Take 1 tablet (15 mg total) by mouth every 4 (four) hours as needed for pain. 10/27/18  Yes Gorsuch, Ni, MD  polyethylene glycol (MIRALAX) packet Take 17 g by mouth daily. Patient taking differently: Take 17 g by mouth daily as needed for mild constipation or moderate constipation.  03/17/18  Yes [name], MD    prochlorperazine (COMPAZINE) 10 MG tablet Take 1 tablet (10 mg total) by mouth every 6 (six) hours as needed (Nausea or vomiting). 10/06/18  Yes [name], MD    Family History Family History  Problem Relation Age of Onset  . Hypertension Mother   . Diabetes Mother   . Hypertension Father   . Diabetes Father     Social History Social History   Tobacco Use  . Smoking status: Never Smoker  . Smokeless tobacco: Never Used  Substance Use Topics  . Alcohol use: No  . Drug use: No     Allergies   Phenergan [promethazine hcl]; Heparin; and Clindamycin   Review of Systems Review of Systems  Constitutional: Negative for fever.  Respiratory: Positive for shortness of breath.   Cardiovascular: Negative for chest pain.  Gastrointestinal: Positive for nausea. Negative for abdominal pain and vomiting.  Neurological: Negative for headaches.  Psychiatric/Behavioral: The patient is nervous/anxious.   All other systems reviewed and are negative.    Physical Exam Updated Vital Signs BP (!) 142/89 (BP Location: Right Arm)   Pulse 86   Temp 98.2 F (36.8 C) (Oral)   Resp (!) 27   SpO2 100%   Physical Exam Vitals signs and nursing note reviewed.  Constitutional:      Appearance: He is well-developed.  HENT:     Head: Normocephalic and atraumatic.     Right Ear: External ear normal.     Left Ear: External ear normal.     Nose: Nose normal.  Eyes:     General:        Right eye: No discharge.        Left eye: No discharge.  Neck:     Musculoskeletal: Neck supple.  Cardiovascular:     Rate and Rhythm: Normal rate and regular rhythm.     Heart sounds: Normal heart sounds.  Pulmonary:     Effort: Pulmonary effort is normal.     Breath sounds: Normal breath sounds.  Abdominal:     Palpations: Abdomen is soft.     Tenderness: There is abdominal tenderness (mild) in the right upper quadrant, epigastric area and left upper quadrant.  Skin:    General: Skin is warm and dry.   Neurological:     Mental Status: He is alert.  Psychiatric:        Mood and Affect: Mood is anxious.     Comments: Appears mildly restless, has to stand up shortly after sitting on the stretcher      ED Treatments / Results  Labs (all labs ordered are listed, but only abnormal results are displayed) Labs Reviewed  COMPREHENSIVE METABOLIC PANEL - Abnormal; Notable for the following components:      Result Value   Sodium 133 (*)    Chloride 97 (*)    Glucose, Bld 219 (*)    Calcium  8.6 (*)    All other components within normal limits  CBC WITH DIFFERENTIAL/PLATELET - Abnormal; Notable for the following components:   RBC 3.88 (*)    Hemoglobin 11.6 (*)    HCT 35.7 (*)    Platelets 499 (*)    Lymphs Abs 0.4 (*)    All other components within normal limits  LIPASE, BLOOD    EKG EKG Interpretation  Date/Time:  Sunday October 30 2018 19:37:18 EST Ventricular Rate:  81 PR Interval:    QRS Duration: 90 QT Interval:  374 QTC Calculation: 435 R Axis:   80 Text Interpretation:  Sinus rhythm ST elev, probable normal early repol pattern Baseline wander in lead(s) V5 no significant change since Oct 09 2018 Confirmed by Sherwood Gambler [phone]) on 10/30/2018 7:42:40 PM   Radiology Dg Chest 2 View  Result Date: 10/30/2018 CLINICAL DATA:  Dyspnea, anxiety. EXAM: CHEST - 2 VIEW COMPARISON:  Most recent radiograph 10/09/2018. Head CT 10/04/2018 FINDINGS: Right chest port with tip in the mid SVC. Normal heart size and mediastinal contours. Right apical and left suprahilar scarring. No new airspace disease. Small left lower lobe nodule on prior PET is not seen radiographically. No pulmonary edema, pleural effusion, or pneumothorax. No acute osseous abnormalities are seen. IMPRESSION: No acute cardiopulmonary findings. Stable scarring. Electronically Signed   By: Keith Rake M.D.   On: 10/30/2018 20:00    Procedures Procedures (including critical care time)  Medications Ordered in  ED Medications  lactated ringers bolus 1,000 mL (0 mLs Intravenous Stopped 10/30/18 2121)  LORazepam (ATIVAN) injection 1 mg (1 mg Intravenous Given 10/30/18 2020)  heparin lock flush 100 unit/mL (500 Units Intracatheter Given 10/30/18 2221)     Initial Impression / Assessment and Plan / ED Course  I have reviewed the triage vital signs and the nursing notes.  Pertinent labs & imaging results that were available during my care of the patient were reviewed by me and considered in my medical decision making (see chart for details).     Patient is feeling better after dose of IV Ativan.  He has not had any vomiting.  He was also given IV fluids.  Labs are around baseline.  He has no significant abnormal findings on exam besides some mild upper abdominal tenderness but with benign blood work I do not know that emergent imaging is needed, especially given this occurred the last time he got chemotherapy.  He has some Ativan at home and was encouraged to use it as instructed.  Otherwise, he appears stable for discharge home and follow-up closely with his oncologist.  Final Clinical Impressions(s) / ED Diagnoses   Final diagnoses:  Nausea    ED Discharge Orders    None       Sherwood Gambler, MD 10/30/18 2224

## 2018-10-30 NOTE — Discharge Instructions (Signed)
If you develop fever, vomiting, severe pain, or any other new/concerning symptoms then return to the ER for evaluation.  Call your oncologist tomorrow.

## 2018-10-31 ENCOUNTER — Ambulatory Visit (HOSPITAL_BASED_OUTPATIENT_CLINIC_OR_DEPARTMENT_OTHER): Payer: Medicaid Other | Admitting: Medical

## 2018-10-31 ENCOUNTER — Other Ambulatory Visit: Payer: Self-pay | Admitting: Medical

## 2018-10-31 ENCOUNTER — Inpatient Hospital Stay: Payer: Medicaid Other

## 2018-10-31 VITALS — BP 141/95 | HR 86 | Temp 98.5°F | Resp 22

## 2018-10-31 DIAGNOSIS — F419 Anxiety disorder, unspecified: Secondary | ICD-10-CM

## 2018-10-31 DIAGNOSIS — C7800 Secondary malignant neoplasm of unspecified lung: Secondary | ICD-10-CM

## 2018-10-31 DIAGNOSIS — Z95828 Presence of other vascular implants and grafts: Secondary | ICD-10-CM

## 2018-10-31 DIAGNOSIS — C760 Malignant neoplasm of head, face and neck: Secondary | ICD-10-CM

## 2018-10-31 DIAGNOSIS — Z5111 Encounter for antineoplastic chemotherapy: Secondary | ICD-10-CM | POA: Diagnosis not present

## 2018-10-31 MED ORDER — LORAZEPAM 2 MG/ML IJ SOLN
0.5000 mg | Freq: Once | INTRAMUSCULAR | Status: AC
  Administered 2018-10-31: 0.5 mg via INTRAVENOUS

## 2018-10-31 MED ORDER — SODIUM CHLORIDE 0.9 % IV SOLN
Freq: Once | INTRAVENOUS | Status: AC
  Administered 2018-10-31: 15:00:00 via INTRAVENOUS
  Filled 2018-10-31: qty 250

## 2018-10-31 MED ORDER — LORAZEPAM 2 MG/ML IJ SOLN
INTRAMUSCULAR | Status: AC
  Filled 2018-10-31: qty 1

## 2018-10-31 MED ORDER — ANTICOAGULANT SODIUM CITRATE 4% (200MG/5ML) IV SOLN
5.0000 mL | Freq: Once | Status: AC
  Administered 2018-10-31: 5 mL
  Filled 2018-10-31: qty 5

## 2018-10-31 MED ORDER — SODIUM CHLORIDE 0.9% FLUSH
10.0000 mL | Freq: Once | INTRAVENOUS | Status: AC | PRN
  Administered 2018-10-31: 10 mL
  Filled 2018-10-31: qty 10

## 2018-10-31 MED ORDER — PAROXETINE HCL 20 MG PO TABS: 20.0000 mg | ORAL_TABLET | Freq: Every day | ORAL | 5 refills | Status: DC

## 2018-10-31 NOTE — Progress Notes (Signed)
Complaining of being anxious and nervous. Having a hard time sitting still, walking around in the infusion room.Went to ER yesterday for nausea. Took Ativan po at home today for anxiety at 1200 today. Took compazine at 1345. Sandi Mealy, PA called and over to see him in the infusion room.

## 2018-10-31 NOTE — Patient Instructions (Signed)

## 2018-11-01 ENCOUNTER — Inpatient Hospital Stay: Payer: Medicaid Other

## 2018-11-01 ENCOUNTER — Telehealth: Payer: Self-pay

## 2018-11-01 ENCOUNTER — Emergency Department (HOSPITAL_COMMUNITY)
Admission: EM | Admit: 2018-11-01 | Discharge: 2018-11-02 | Disposition: A | Discharge status: Home / Self Care | Payer: Medicaid Other | Attending: Emergency Medicine | Admitting: Emergency Medicine

## 2018-11-01 ENCOUNTER — Other Ambulatory Visit: Payer: Self-pay

## 2018-11-01 ENCOUNTER — Encounter (HOSPITAL_COMMUNITY): Payer: Self-pay | Admitting: *Deleted

## 2018-11-01 DIAGNOSIS — F419 Anxiety disorder, unspecified: Secondary | ICD-10-CM | POA: Insufficient documentation

## 2018-11-01 DIAGNOSIS — E039 Hypothyroidism, unspecified: Secondary | ICD-10-CM | POA: Diagnosis not present

## 2018-11-01 DIAGNOSIS — Z79899 Other long term (current) drug therapy: Secondary | ICD-10-CM | POA: Diagnosis not present

## 2018-11-01 DIAGNOSIS — R110 Nausea: Secondary | ICD-10-CM | POA: Diagnosis present

## 2018-11-01 LAB — CBC
HCT: 38.6 % — ABNORMAL LOW (ref 39.0–52.0)
Hemoglobin: 13.1 g/dL (ref 13.0–17.0)
MCH: 30.2 pg (ref 26.0–34.0)
MCHC: 33.9 g/dL (ref 30.0–36.0)
MCV: 88.9 fL (ref 80.0–100.0)
NRBC: 0 % (ref 0.0–0.2)
Platelets: 499 10*3/uL — ABNORMAL HIGH (ref 150–400)
RBC: 4.34 MIL/uL (ref 4.22–5.81)
RDW: 13.9 % (ref 11.5–15.5)
WBC: 3.2 10*3/uL — AB (ref 4.0–10.5)

## 2018-11-01 LAB — COMPREHENSIVE METABOLIC PANEL
ALT: 69 U/L — ABNORMAL HIGH (ref 0–44)
AST: 41 U/L (ref 15–41)
Albumin: 4.5 g/dL (ref 3.5–5.0)
Alkaline Phosphatase: 52 U/L (ref 38–126)
Anion gap: 10 (ref 5–15)
BUN: 18 mg/dL (ref 6–20)
CO2: 27 mmol/L (ref 22–32)
Calcium: 9.2 mg/dL (ref 8.9–10.3)
Chloride: 96 mmol/L — ABNORMAL LOW (ref 98–111)
Creatinine, Ser: 0.94 mg/dL (ref 0.61–1.24)
GFR calc Af Amer: >60 mL/min (ref >60)
GFR calc non Af Amer: >60 mL/min (ref >60)
Glucose, Bld: 110 mg/dL — ABNORMAL HIGH (ref 70–99)
Potassium: 3.8 mmol/L (ref 3.5–5.1)
Sodium: 133 mmol/L — ABNORMAL LOW (ref 135–145)
Total Bilirubin: 0.3 mg/dL (ref 0.3–1.2)
Total Protein: 7.5 g/dL (ref 6.5–8.1)

## 2018-11-01 MED ORDER — LORAZEPAM 2 MG/ML IJ SOLN
2.0000 mg | Freq: Once | INTRAMUSCULAR | Status: AC
  Administered 2018-11-01: 2 mg via INTRAMUSCULAR
  Filled 2018-11-01: qty 1

## 2018-11-01 MED ORDER — SODIUM CHLORIDE 0.9 % IV BOLUS
1000.0000 mL | Freq: Once | INTRAVENOUS | Status: AC
  Administered 2018-11-01: 1000 mL via INTRAVENOUS

## 2018-11-01 MED ORDER — LORAZEPAM 1 MG PO TABS: 1.0000 mg | ORAL_TABLET | Freq: Four times a day (QID) | ORAL | 0 refills | Status: DC | PRN

## 2018-11-01 MED ORDER — SODIUM CHLORIDE 0.9 % IV SOLN: INTRAVENOUS | Status: DC

## 2018-11-01 NOTE — ED Notes (Signed)
Bed: WA07 Expected date:  Expected time:  Means of arrival:  Comments: Triage 3 

## 2018-11-01 NOTE — ED Notes (Addendum)
Pt appears to be anxious at this time, pacing the room and hallway. Pt states his anxiety is causing him to become nauseated. Pt is requesting "something to calm him down." Pt tried to stand and pace while RN was trying to collect blood work. Last chemo was Friday, 10/28/2018.

## 2018-11-01 NOTE — Telephone Encounter (Signed)
He called and left a message to call him.  Called back. He is short of breath on the phone and breathing heavy. Very anxious and has been unable to rest or sleep since yesterday. Appt canceled for IV fluids today. Instructed to call or go to ER now. He verbalized understanding.

## 2018-11-01 NOTE — Progress Notes (Signed)
Instructed patient to stop taking future Dexamethasone per Dr. Alvy Bimler. He verbalized understanding.

## 2018-11-01 NOTE — ED Notes (Addendum)
Pt requested port-a-cath to be accessed, however RN advised that we wait to see what EDP's plan of care was. Pt is unable to sit still at this time. IV access was denied at this time.

## 2018-11-01 NOTE — ED Triage Notes (Signed)
Pt reports heel pain, nausea, and feeling anxious and agitated, he was seen x 2 days ago for same.  Was given lorazepam for the anxiety without relief.  He is unsure what is causing this.  He is pacing the room, he states " I cannot sit still, you'll have to excuse me."

## 2018-11-01 NOTE — ED Provider Notes (Signed)
[facility] DEPT Provider Note   CSN: [id number] Arrival date & time: 11/01/18  1907     History   Chief Complaint Chief Complaint  Patient presents with  . Nausea    HPI Rafay Dahan is a 33 y.o. male.  33 year old male with recent history of cancer received 1 round of chemotherapy presents with increased anxiety.  Seen here 2 days ago for similar symptoms.  Describes a sensation of impending doom with associated nausea with it.  No fever or chills.  Denies any suicidal or homicidal ideations.  Patient has been prescribed Ativan which she has been taking without relief.  He also endorses severe insomnia and states that he is lying asleep in about 2 hours a night     Past Medical History:  Diagnosis Date  . Arrhythmia 12/25/2015  . Carcinoma ([place])   . Fatigue 06/01/2014  . Hypothyroidism   . Infection of eyelash follicle of left eye 2/54/2706  . 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 Neuropathy   . Radiation 07/18/14-07/31/14   Left upper lobe  40 gy in 10 fractions  . 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 Nausea and vomiting 10/14/2018  . Wound, open, neck, subsequent encounter 10/14/2018  . Oral thrush 10/14/2018  . Goals of care, counseling/discussion 09/21/2018  . Cancer associated pain 09/08/2018  . Financial difficulties 11/26/2017  . Viral URI with cough 10/22/2017  . Medically noncompliant 04/13/2017  . Vertigo, intermittent 11/02/2016  . Port catheter in place 04/01/2016  . Epigastric pain 02/10/2016  . Gastritis without bleeding 01/22/2016  . Arrhythmia 12/25/2015  . Infection of eyelash follicle of left eye 23/76/2831  . Poor dentition 10/16/2015  . Other constipation 05/16/2015  . Neck pain on right side 01/28/2015  . 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 Acquired hypothyroidism 06/09/2014  .  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 Chronic fatigue 06/01/2014    Past Surgical History:  Procedure Laterality Date  . LUNG REMOVAL, PARTIAL  05/03/2012   left upper lobectomy  . nasal biopsy    . RADICAL NECK DISSECTION    . VIDEO BRONCHOSCOPY N/A 06/29/2014   Procedure: VIDEO BRONCHOSCOPY ;  Surgeon: Melrose Nakayama, MD;  Location: [facility];  Service: Thoracic;  Laterality: N/A;        Home Medications    Prior to Admission medications   Medication Sig Start Date End Date Taking? Authorizing Provider  levothyroxine (SYNTHROID, LEVOTHROID) 137 MCG tablet TAKE 1 TABLET(137 MCG) BY MOUTH DAILY BEFORE BREAKFAST Patient taking differently: Take 137 mcg by mouth daily before breakfast.  10/06/18   [name], MD  LORazepam (ATIVAN) 0.5 MG tablet Take 2 tablets (1 mg total) by mouth 3 (three) times daily as needed for up to 3 doses for anxiety. 10/10/18   Couture, Cortni S, PA-C  omeprazole (PRILOSEC) 20 MG capsule Take 1 capsule (20 mg total) by mouth daily. 10/18/18   Tanner, Lyndon Code., PA-C  ondansetron (ZOFRAN) 8 MG tablet Take 1 tablet (8 mg total) by mouth every 8 (eight) hours as needed for nausea or vomiting. 10/14/18   [name], MD  oxyCODONE (ROXICODONE) 15 MG immediate release tablet Take 1 tablet (15 mg total) by mouth every 4 (four) hours as needed for pain. 10/27/18   Alvy Bimler,  Ni, MD  PARoxetine (PAXIL) 20 MG tablet Take 1 tablet (20 mg total) by mouth daily. 10/31/18   Tanner, Lyndon Code., PA-C  polyethylene glycol [facility]) packet Take 17 g by mouth daily. Patient taking differently: Take 17 g by mouth daily as needed for mild constipation or moderate constipation.  03/17/18   [name], MD  prochlorperazine (COMPAZINE) 10 MG tablet Take 1 tablet (10 mg total) by mouth every 6 (six) hours as needed (Nausea or vomiting). 10/06/18   [name], MD    Family History Family History  Problem Relation Age of Onset  . Hypertension  Mother   . Diabetes Mother   . Hypertension Father   . Diabetes Father     Social History Social History   Tobacco Use  . Smoking status: Never Smoker  . Smokeless tobacco: Never Used  Substance Use Topics  . Alcohol use: No  . Drug use: No     Allergies   Phenergan [promethazine hcl]; Heparin; and Clindamycin   Review of Systems Review of Systems  All other systems reviewed and are negative.    Physical Exam Updated Vital Signs BP (!) 116/98 (BP Location: Right Arm)   Pulse (!) 101   Temp 98.3 F (36.8 C) (Oral)   Resp 17   SpO2 99%   Physical Exam Vitals signs and nursing note reviewed.  Constitutional:      General: He is not in acute distress.    Appearance: Normal appearance. He is well-developed. He is not toxic-appearing.  HENT:     Head: Normocephalic and atraumatic.  Eyes:     General: Lids are normal.     Conjunctiva/sclera: Conjunctivae normal.     Pupils: Pupils are equal, round, and reactive to light.  Neck:     Musculoskeletal: Normal range of motion and neck supple.     Thyroid: No thyroid mass.     Trachea: No tracheal deviation.  Cardiovascular:     Rate and Rhythm: Normal rate and regular rhythm.     Heart sounds: Normal heart sounds. No murmur. No gallop.   Pulmonary:     Effort: Pulmonary effort is normal. No respiratory distress.     Breath sounds: Normal breath sounds. No stridor. No decreased breath sounds, wheezing, rhonchi or rales.  Abdominal:     General: Bowel sounds are normal. There is no distension.     Palpations: Abdomen is soft.     Tenderness: There is no abdominal tenderness. There is no rebound.  Musculoskeletal: Normal range of motion.        General: No tenderness.  Skin:    General: Skin is warm and dry.     Findings: No abrasion or rash.  Neurological:     Mental Status: He is alert and oriented to person, place, and time.     GCS: GCS eye subscore is 4. GCS verbal subscore is 5. GCS motor subscore is 6.      Cranial Nerves: No cranial nerve deficit.     Sensory: No sensory deficit.  Psychiatric:        Attention and Perception: He is attentive. He does not perceive auditory hallucinations.        Mood and Affect: Mood is anxious and depressed.        Speech: Speech normal.        Behavior: Behavior normal.        Thought Content: Thought content does not include suicidal ideation. Thought content does not include suicidal  plan.      ED Treatments / Results  Labs (all labs ordered are listed, but only abnormal results are displayed) Labs Reviewed  CBC - Abnormal; Notable for the following components:      Result Value   WBC 3.2 (*)    HCT 38.6 (*)    Platelets 499 (*)    All other components within normal limits  COMPREHENSIVE METABOLIC PANEL  URINALYSIS, ROUTINE W REFLEX MICROSCOPIC    EKG None  Radiology No results found.  Procedures Procedures (including critical care time)  Medications Ordered in ED Medications  LORazepam (ATIVAN) injection 2 mg (has no administration in time range)     Initial Impression / Assessment and Plan / ED Course  I have reviewed the triage vital signs and the nursing notes.  Pertinent labs & imaging results that were available during my care of the patient were reviewed by me and considered in my medical decision making (see chart for details).     Patient given Ativan 2 mg IM here and feels much better.  He is resting comfortably.  Patient has been receiving daily IV fluids at the cancer centers and starting chemo.  He missed a dose today.  Was given 1 L of saline here and will be discharged to follow-up in the cancer center tomorrow.  Final Clinical Impressions(s) / ED Diagnoses   Final diagnoses:  None    ED Discharge Orders    None       Lacretia Leigh, MD 11/01/18 2323

## 2018-11-01 NOTE — Discharge Instructions (Signed)
Follow-up in the cancer center tomorrow

## 2018-11-02 ENCOUNTER — Ambulatory Visit (HOSPITAL_BASED_OUTPATIENT_CLINIC_OR_DEPARTMENT_OTHER): Payer: Medicaid Other | Admitting: Medical

## 2018-11-02 ENCOUNTER — Other Ambulatory Visit: Payer: Self-pay | Admitting: Hematology and Oncology

## 2018-11-02 ENCOUNTER — Inpatient Hospital Stay: Payer: Medicaid Other

## 2018-11-02 ENCOUNTER — Telehealth: Payer: Self-pay | Admitting: *Deleted

## 2018-11-02 VITALS — BP 114/82 | HR 115 | Temp 98.6°F | Resp 18

## 2018-11-02 DIAGNOSIS — F419 Anxiety disorder, unspecified: Secondary | ICD-10-CM

## 2018-11-02 DIAGNOSIS — C7800 Secondary malignant neoplasm of unspecified lung: Secondary | ICD-10-CM

## 2018-11-02 DIAGNOSIS — C760 Malignant neoplasm of head, face and neck: Secondary | ICD-10-CM

## 2018-11-02 DIAGNOSIS — Z95828 Presence of other vascular implants and grafts: Secondary | ICD-10-CM

## 2018-11-02 DIAGNOSIS — Z5111 Encounter for antineoplastic chemotherapy: Secondary | ICD-10-CM | POA: Diagnosis not present

## 2018-11-02 MED ORDER — ANTICOAGULANT SODIUM CITRATE 4% (200MG/5ML) IV SOLN
5.0000 mL | Freq: Once | Status: AC
  Administered 2018-11-02: 5 mL
  Filled 2018-11-02: qty 5

## 2018-11-02 MED ORDER — LORAZEPAM 2 MG/ML IJ SOLN
INTRAMUSCULAR | Status: AC
  Filled 2018-11-02: qty 1

## 2018-11-02 MED ORDER — LORAZEPAM 2 MG/ML IJ SOLN
1.0000 mg | Freq: Once | INTRAMUSCULAR | Status: AC
  Administered 2018-11-02: 1 mg via INTRAVENOUS

## 2018-11-02 MED ORDER — HEPARIN SOD (PORK) LOCK FLUSH 100 UNIT/ML IV SOLN
500.0000 [IU] | Freq: Once | INTRAVENOUS | Status: AC
  Administered 2018-11-02: 500 [IU]
  Filled 2018-11-02: qty 5

## 2018-11-02 MED ORDER — SODIUM CHLORIDE 0.9 % IV SOLN
Freq: Once | INTRAVENOUS | Status: AC
  Administered 2018-11-02: 15:00:00 via INTRAVENOUS
  Filled 2018-11-02: qty 250

## 2018-11-02 MED ORDER — LORAZEPAM 1 MG PO TABS: 1.0000 mg | ORAL_TABLET | Freq: Three times a day (TID) | ORAL | 3 refills | Status: DC | PRN

## 2018-11-02 MED ORDER — SODIUM CHLORIDE 0.9% FLUSH
10.0000 mL | Freq: Once | INTRAVENOUS | Status: AC | PRN
  Administered 2018-11-02: 10 mL
  Filled 2018-11-02: qty 10

## 2018-11-02 NOTE — Telephone Encounter (Signed)
Patient called requesting to talk to Dr. Alvy Bimler. Patient was in the ED yesterday for an anxiety attack. He still continues to feel anxious, nervous and paces through the house all night long. He is not sleeping. ED gave him 8 tablets of Ativan. He says this helps but he still feels anxious. He was not able to sleep again last night. Patient was tearful on the phone.   Medication review with patient. He did not start Paxil as prescribed on 10/31/18. He took one while on the phone with Probation officer. He was advised he is suppose to take this daily for it to work and it will take a little while for it to begin working.   He will need a refill of the Ativan. Please send to [facility]

## 2018-11-02 NOTE — Telephone Encounter (Signed)
Refill sent.

## 2018-11-02 NOTE — Patient Instructions (Signed)

## 2018-11-02 NOTE — Progress Notes (Signed)
Pt pacing, restless, and reporting extreme anxiety.  Denies SI, HI, or AVH.  Denies paranoia or changes in mentation.  Reports severe insomnia and anorexia.  Pt's HR elevated.  States "I don't know why I feel like this but I can't stop feeling anxious".

## 2018-11-02 NOTE — Progress Notes (Signed)
Christian Simmons was seen in the infusion room today for a panic attack. The patient was very anxious and initially could not be comforted. He was given 1 mg of Ativan IV at 3:00 PM. He requested that this provider stay with him in the bedroom. He was able to fall asleep and slept for around 1 hour. He awoke then wanted to get out of bed and walk. He requested additional Ativan as he was becoming anxious again. I reviewed his medications with him prior to dosing with Ativan then again after he requested a second dose of Ativan. I again reiterated to him that he could not take Ativan more than every 8 hours as I was concerned that he would be at risk for increased sedation, breathing difficulties, and possible falls. I reviewed the proper use of Paxil with the patient and told him that he should take 20 mg once daily rather than only when he felt anxious. I again reviewed with him that the effects of Paxil would not be immediate and that he should continue taking this daily even though he noted no difference in the way he was feeling initially. His nurse and I reviewed his medications. We consolidated two of the same bottles of Compazine. His nurse Jethro Bolus) reported that the patient had been taking Compazine Q 6 hours regularly and recalled that this could cause agitation. The patient was advised to take Compazine as needed for nausea. He was told that he could take Zofran as needed for nausea. I attempted to take the patient through several relaxation techniques which did not seem to help. He was able to fall back to sleep for around 30 minutes but only with me sitting in the room with him.  Sandi Mealy, MHS, PA-C Physician Assistant

## 2018-11-02 NOTE — ED Provider Notes (Signed)
12:00 AM  Assumed care from Dr. Zenia Resides.  Patient is a 33 year old male with history of cancer who recently received chemotherapy who presents with nausea, vomiting, anxiety.  Has been given IV fluids and IV Ativan.  Will be reassessed after fluids completed.  If he is feeling better, plans for discharge home with close follow-up with the cancer center.  12:50 AM  Pt's IV fluids are complete.  Family reports he is feeling better.  They feel comfortable with plan for discharge home.  Prescription of Ativan has been sent to his pharmacy.  At this time, I do not feel there is any life-threatening condition present. I have reviewed and discussed all results (EKG, imaging, lab, urine as appropriate) and exam findings with patient/family. I have reviewed nursing notes and appropriate previous records.  I feel the patient is safe to be discharged home without further emergent workup and can continue workup as an outpatient as needed. Discussed usual and customary return precautions. Patient/family verbalize understanding and are comfortable with this plan.  Outpatient follow-up has been provided as needed. All questions have been answered.    Karlisha Mathena, Delice Bison, DO 11/02/18 [phone]

## 2018-11-03 ENCOUNTER — Ambulatory Visit (HOSPITAL_BASED_OUTPATIENT_CLINIC_OR_DEPARTMENT_OTHER): Payer: Medicaid Other | Admitting: Medical

## 2018-11-03 ENCOUNTER — Other Ambulatory Visit: Payer: Self-pay | Admitting: Medical

## 2018-11-03 ENCOUNTER — Inpatient Hospital Stay: Payer: Medicaid Other

## 2018-11-03 VITALS — BP 145/104 | HR 90 | Temp 98.6°F | Resp 20

## 2018-11-03 DIAGNOSIS — C7800 Secondary malignant neoplasm of unspecified lung: Secondary | ICD-10-CM

## 2018-11-03 DIAGNOSIS — R112 Nausea with vomiting, unspecified: Secondary | ICD-10-CM

## 2018-11-03 DIAGNOSIS — R110 Nausea: Secondary | ICD-10-CM

## 2018-11-03 DIAGNOSIS — Z5111 Encounter for antineoplastic chemotherapy: Secondary | ICD-10-CM | POA: Diagnosis not present

## 2018-11-03 DIAGNOSIS — C760 Malignant neoplasm of head, face and neck: Secondary | ICD-10-CM

## 2018-11-03 DIAGNOSIS — Z95828 Presence of other vascular implants and grafts: Secondary | ICD-10-CM

## 2018-11-03 DIAGNOSIS — F410 Panic disorder [episodic paroxysmal anxiety] without agoraphobia: Secondary | ICD-10-CM

## 2018-11-03 MED ORDER — HEPARIN SOD (PORK) LOCK FLUSH 100 UNIT/ML IV SOLN
250.0000 [IU] | Freq: Once | INTRAVENOUS | Status: DC | PRN
  Filled 2018-11-03: qty 5

## 2018-11-03 MED ORDER — SODIUM CHLORIDE 0.9 % IV SOLN
Freq: Once | INTRAVENOUS | Status: AC
  Administered 2018-11-03: 16:00:00 via INTRAVENOUS
  Filled 2018-11-03: qty 250

## 2018-11-03 MED ORDER — SODIUM CHLORIDE 0.9 % IV SOLN: Freq: Once | INTRAVENOUS | Status: DC

## 2018-11-03 MED ORDER — ONDANSETRON HCL 4 MG/2ML IJ SOLN
8.0000 mg | Freq: Once | INTRAMUSCULAR | Status: AC
  Administered 2018-11-03: 8 mg via INTRAVENOUS

## 2018-11-03 MED ORDER — ANTICOAGULANT SODIUM CITRATE 4% (200MG/5ML) IV SOLN
5.0000 mL | Freq: Once | Status: AC
  Administered 2018-11-03: 5 mL via INTRAVENOUS
  Filled 2018-11-03: qty 5

## 2018-11-03 MED ORDER — SODIUM CHLORIDE 0.9% FLUSH
3.0000 mL | Freq: Once | INTRAVENOUS | Status: DC | PRN
  Filled 2018-11-03: qty 10

## 2018-11-03 MED ORDER — ONDANSETRON HCL 4 MG/2ML IJ SOLN
INTRAMUSCULAR | Status: AC
  Filled 2018-11-03: qty 4

## 2018-11-03 MED ORDER — ALTEPLASE 2 MG IJ SOLR
2.0000 mg | Freq: Once | INTRAMUSCULAR | Status: DC | PRN
  Filled 2018-11-03: qty 2

## 2018-11-03 MED ORDER — LORAZEPAM 1 MG PO TABS
0.5000 mg | ORAL_TABLET | Freq: Once | ORAL | Status: AC
  Administered 2018-11-03: 0.5 mg via SUBLINGUAL

## 2018-11-03 MED ORDER — SODIUM CHLORIDE 0.9% FLUSH
10.0000 mL | Freq: Once | INTRAVENOUS | Status: AC | PRN
  Administered 2018-11-03: 10 mL
  Filled 2018-11-03: qty 10

## 2018-11-03 MED ORDER — LORAZEPAM 1 MG PO TABS
ORAL_TABLET | ORAL | Status: AC
  Filled 2018-11-03: qty 1

## 2018-11-03 MED ORDER — HEPARIN SOD (PORK) LOCK FLUSH 100 UNIT/ML IV SOLN
500.0000 [IU] | Freq: Once | INTRAVENOUS | Status: DC | PRN
  Filled 2018-11-03: qty 5

## 2018-11-03 NOTE — Patient Instructions (Signed)

## 2018-11-03 NOTE — Progress Notes (Signed)
Upon administration of Zofran patient had episode of emesis, blood noted. Nurse discontinued Zofran IV push.  Epistaxis noted as well, moderate amount.  Vomiting subsided, as did epistaxis. VS obtained via MAR.  Sandi Mealy, PA in to assess patient.  Orders given for OK to continue Zofran.  Patient tolerated well.  No distress.

## 2018-11-03 NOTE — Progress Notes (Signed)
Mr. Christian Simmons was seen in the infusion room today. He has been dealing with increasing anxiety. He is especially troubled with this today. He took Ativan 0.5 mg at 12:00 PM today at home without benefit. I discussed adding an SSRI today. I discuss that I preferred this class of medication as SSRI's could benefit depression, anxiety, OCD, and panic disorder. He is agreeable to try this. I sent in a prescription for Paxil 20 mg once daily to his pharmacy.  Sandi Mealy, MHS, PA-C Physician Assistant

## 2018-11-04 ENCOUNTER — Inpatient Hospital Stay: Payer: Medicaid Other

## 2018-11-04 ENCOUNTER — Encounter: Payer: Self-pay | Admitting: Hematology and Oncology

## 2018-11-04 ENCOUNTER — Telehealth: Payer: Self-pay | Admitting: Hematology and Oncology

## 2018-11-04 ENCOUNTER — Inpatient Hospital Stay (HOSPITAL_BASED_OUTPATIENT_CLINIC_OR_DEPARTMENT_OTHER): Payer: Medicaid Other | Admitting: Hematology and Oncology

## 2018-11-04 DIAGNOSIS — R112 Nausea with vomiting, unspecified: Secondary | ICD-10-CM | POA: Diagnosis not present

## 2018-11-04 DIAGNOSIS — Z95828 Presence of other vascular implants and grafts: Secondary | ICD-10-CM

## 2018-11-04 DIAGNOSIS — C7800 Secondary malignant neoplasm of unspecified lung: Secondary | ICD-10-CM

## 2018-11-04 DIAGNOSIS — Z5111 Encounter for antineoplastic chemotherapy: Secondary | ICD-10-CM | POA: Diagnosis not present

## 2018-11-04 DIAGNOSIS — E039 Hypothyroidism, unspecified: Secondary | ICD-10-CM

## 2018-11-04 DIAGNOSIS — G893 Neoplasm related pain (acute) (chronic): Secondary | ICD-10-CM

## 2018-11-04 DIAGNOSIS — R64 Cachexia: Secondary | ICD-10-CM | POA: Insufficient documentation

## 2018-11-04 DIAGNOSIS — C760 Malignant neoplasm of head, face and neck: Secondary | ICD-10-CM

## 2018-11-04 DIAGNOSIS — K5909 Other constipation: Secondary | ICD-10-CM | POA: Diagnosis not present

## 2018-11-04 DIAGNOSIS — C119 Malignant neoplasm of nasopharynx, unspecified: Secondary | ICD-10-CM | POA: Diagnosis not present

## 2018-11-04 LAB — CBC WITH DIFFERENTIAL/PLATELET
Abs Immature Granulocytes: 0.81 10*3/uL — ABNORMAL HIGH (ref 0.00–0.07)
Basophils Absolute: 0.2 10*3/uL — ABNORMAL HIGH (ref 0.0–0.1)
Basophils Relative: 2 %
Eosinophils Absolute: 0 10*3/uL (ref 0.0–0.5)
Eosinophils Relative: 1 %
HCT: 37.3 % — ABNORMAL LOW (ref 39.0–52.0)
Hemoglobin: 12.9 g/dL — ABNORMAL LOW (ref 13.0–17.0)
Immature Granulocytes: 12 %
Lymphocytes Relative: 21 %
Lymphs Abs: 1.5 10*3/uL (ref 0.7–4.0)
MCH: 30 pg (ref 26.0–34.0)
MCHC: 34.6 g/dL (ref 30.0–36.0)
MCV: 86.7 fL (ref 80.0–100.0)
MONO ABS: 0.6 10*3/uL (ref 0.1–1.0)
Monocytes Relative: 8 %
Neutro Abs: 3.9 10*3/uL (ref 1.7–7.7)
Neutrophils Relative %: 56 %
Platelets: 335 10*3/uL (ref 150–400)
RBC: 4.3 MIL/uL (ref 4.22–5.81)
RDW: 13.9 % (ref 11.5–15.5)
WBC: 7 10*3/uL (ref 4.0–10.5)
nRBC: 0 % (ref 0.0–0.2)

## 2018-11-04 LAB — COMPREHENSIVE METABOLIC PANEL
ALT: 53 U/L — ABNORMAL HIGH (ref 0–44)
AST: 28 U/L (ref 15–41)
Albumin: 4.2 g/dL (ref 3.5–5.0)
Alkaline Phosphatase: 71 U/L (ref 38–126)
Anion gap: 10 (ref 5–15)
BUN: 13 mg/dL (ref 6–20)
CHLORIDE: 101 mmol/L (ref 98–111)
CO2: 26 mmol/L (ref 22–32)
Calcium: 9.5 mg/dL (ref 8.9–10.3)
Creatinine, Ser: 0.98 mg/dL (ref 0.61–1.24)
GFR calc Af Amer: >60 mL/min (ref >60)
GFR calc non Af Amer: >60 mL/min (ref >60)
GLUCOSE: 130 mg/dL — AB (ref 70–99)
Potassium: 3.9 mmol/L (ref 3.5–5.1)
Sodium: 137 mmol/L (ref 135–145)
Total Bilirubin: 0.3 mg/dL (ref 0.3–1.2)
Total Protein: 7.1 g/dL (ref 6.5–8.1)

## 2018-11-04 LAB — TSH: TSH: 6.256 u[IU]/mL — ABNORMAL HIGH (ref 0.320–4.118)

## 2018-11-04 LAB — MAGNESIUM: Magnesium: 1.9 mg/dL (ref 1.7–2.4)

## 2018-11-04 MED ORDER — SODIUM CHLORIDE 0.9% FLUSH
10.0000 mL | Freq: Once | INTRAVENOUS | Status: AC
  Administered 2018-11-04: 10 mL
  Filled 2018-11-04: qty 10

## 2018-11-04 MED ORDER — SODIUM CHLORIDE 0.9 % IV SOLN: Freq: Once | INTRAVENOUS | Status: DC

## 2018-11-04 MED ORDER — ONDANSETRON HCL 4 MG/2ML IJ SOLN
INTRAMUSCULAR | Status: AC
  Filled 2018-11-04: qty 4

## 2018-11-04 MED ORDER — ONDANSETRON HCL 4 MG/2ML IJ SOLN
8.0000 mg | Freq: Once | INTRAMUSCULAR | Status: AC
  Administered 2018-11-04: 8 mg via INTRAVENOUS

## 2018-11-04 MED ORDER — SENNOSIDES-DOCUSATE SODIUM 8.6-50 MG PO TABS: 2.0000 | ORAL_TABLET | Freq: Three times a day (TID) | ORAL | 0 refills | Status: DC

## 2018-11-04 MED ORDER — MIRTAZAPINE 15 MG PO TABS: 15.0000 mg | ORAL_TABLET | Freq: Every day | ORAL | 1 refills | Status: DC

## 2018-11-04 MED ORDER — ANTICOAGULANT SODIUM CITRATE 4% (200MG/5ML) IV SOLN
5.0000 mL | Freq: Once | Status: AC
  Administered 2018-11-04: 5 mL
  Filled 2018-11-04 (×2): qty 5

## 2018-11-04 MED ORDER — SODIUM CHLORIDE 0.9 % IV SOLN
Freq: Once | INTRAVENOUS | Status: AC
  Administered 2018-11-04: 10:00:00 via INTRAVENOUS
  Filled 2018-11-04: qty 250

## 2018-11-04 NOTE — Assessment & Plan Note (Signed)
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

## 2018-11-04 NOTE — Patient Instructions (Signed)
Dehydration, Adult  Dehydration is when there is not enough fluid or water in your body. This happens when you lose more fluids than you take in. Dehydration can range from mild to very bad. It should be treated right away to keep it from getting very bad. Symptoms of mild dehydration may include:  Thirst.  Dry lips.  Slightly dry mouth.  Dry, warm skin.  Dizziness. Symptoms of moderate dehydration may include:  Very dry mouth.  Muscle cramps.  Dark pee (urine). Pee may be the color of tea.  Your body making less pee.  Your eyes making fewer tears.  Heartbeat that is uneven or faster than normal (palpitations).  Headache.  Light-headedness, especially when you stand up from sitting.  Fainting (syncope). Symptoms of very bad dehydration may include:  Changes in skin, such as: ? Cold and clammy skin. ? Blotchy (mottled) or pale skin. ? Skin that does not quickly return to normal after being lightly pinched and let go (poor skin turgor).  Changes in body fluids, such as: ? Feeling very thirsty. ? Your eyes making fewer tears. ? Not sweating when body temperature is high, such as in hot weather. ? Your body making very little pee.  Changes in vital signs, such as: ? Weak pulse. ? Pulse that is more than 100 beats a minute when you are sitting still. ? Fast breathing. ? Low blood pressure.  Other changes, such as: ? Sunken eyes. ? Cold hands and feet. ? Confusion. ? Lack of energy (lethargy). ? Trouble waking up from sleep. ? Short-term weight loss. ? Unconsciousness. Follow these instructions at home:   If told by your doctor, drink an ORS: ? Make an ORS by using instructions on the package. ? Start by drinking small amounts, about  cup (120 mL) every 5-10 minutes. ? Slowly drink more until you have had the amount that your doctor said to have.  Drink enough clear fluid to keep your pee clear or pale yellow. If you were told to drink an ORS, finish the  ORS first, then start slowly drinking clear fluids. Drink fluids such as: ? Water. Do not drink only water by itself. Doing that can make the salt (sodium) level in your body get too low (hyponatremia). ? Ice chips. ? Fruit juice that you have added water to (diluted). ? Low-calorie sports drinks.  Avoid: ? Alcohol. ? Drinks that have a lot of sugar. These include high-calorie sports drinks, fruit juice that does not have water added, and soda. ? Caffeine. ? Foods that are greasy or have a lot of fat or sugar.  Take over-the-counter and prescription medicines only as told by your doctor.  Do not take salt tablets. Doing that can make the salt level in your body get too high (hypernatremia).  Eat foods that have minerals (electrolytes). Examples include bananas, oranges, potatoes, tomatoes, and spinach.  Keep all follow-up visits as told by your doctor. This is important. Contact a doctor if:  You have belly (abdominal) pain that: ? Gets worse. ? Stays in one area (localizes).  You have a rash.  You have a stiff neck.  You get angry or annoyed more easily than normal (irritability).  You are more sleepy than normal.  You have a harder time waking up than normal.  You feel: ? Weak. ? Dizzy. ? Very thirsty.  You have peed (urinated) only a small amount of very dark pee during 6-8 hours. Get help right away if:  You have   symptoms of very bad dehydration.  You cannot drink fluids without throwing up (vomiting).  Your symptoms get worse with treatment.  You have a fever.  You have a very bad headache.  You are throwing up or having watery poop (diarrhea) and it: ? Gets worse. ? Does not go away.  You have blood or something green (bile) in your throw-up.  You have blood in your poop (stool). This may cause poop to look black and tarry.  You have not peed in 6-8 hours.  You pass out (faint).  Your heart rate when you are sitting still is more than 100 beats a  minute.  You have trouble breathing. This information is not intended to replace advice given to you by your health care provider. Make sure you discuss any questions you have with your health care provider. Document Released: 07/18/2009 Document Revised: 04/10/2016 Document Reviewed: 11/15/2015 Elsevier Interactive Patient Education  2019 [employer].  

## 2018-11-04 NOTE — Assessment & Plan Note (Signed)
His severe constipation has contributed to nausea and inability to eat We discussed aggressive laxative therapy

## 2018-11-04 NOTE — Assessment & Plan Note (Signed)
His cancer associated right neck pain has improved since chemotherapy He will continue oxycodone as needed

## 2018-11-04 NOTE — Assessment & Plan Note (Signed)
The patient tolerated chemotherapy very poorly with multiple side effects and trips to the emergency room Due to unresolved nausea and anorexia, I will hold his treatment today and rescheduled to next week In the meantime, we will continue aggressive supportive care

## 2018-11-04 NOTE — Assessment & Plan Note (Signed)
He has difficulty tolerating nausea medicine He will continue IV fluids and IV antiemetics

## 2018-11-04 NOTE — Progress Notes (Signed)
[facility] OFF[facility] PROGRESS NOTE  Patient Care Team: Patient, No Pcp Per as PCP - General (General Practice) [name], MD as Consulting Physician (Hematology and Oncology) Eppie Gibson, MD as Attending Physician (Radiation Oncology) Jodi Marble, MD as Consulting Physician (Otolaryngology) Philomena Doheny, MD as Referring Physician (Plastic Surgery) Irene Shipper, MD as Consulting Physician (Gastroenterology) System, Provider Not In  ASSESSMENT & PLAN:  Primary cancer of head and neck [facility]) The patient tolerated chemotherapy very poorly with multiple side effects and trips to the emergency room Due to unresolved nausea and anorexia, I will hold his treatment today and rescheduled to next week In the meantime, we will continue aggressive supportive care  Other constipation His severe constipation has contributed to nausea and inability to eat We discussed aggressive laxative therapy  Nausea and vomiting He has difficulty tolerating nausea medicine He will continue IV fluids and IV antiemetics  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   INTERVAL HISTORY: Please see below for problem oriented charting. The patient is seen today prior to treatment His wife is present He has lost some weight due to inability to eat from uncontrolled nausea & vomiting The dexamethasone, will help with his nausea, has caused significant irritability and difficulties with sleep He is on scheduled lorazepam The patient has been somewhat noncompliant with some of the treatment recommendation recently He is severely constipated, with no bowel movement more than 3 days He did not  take laxatives because he is afraid to swallow liquid MiraLAX Denies recent fever or chills His pain is well controlled  SUMMARY OF ONCOLOGIC HISTORY: Oncology History   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 Pan-endoscopy elsewhere showed cancer from nasopharyngeal space.    01/04/2007 - 02/20/2007 Chemotherapy    He received 2 cycles of cisplatin and 5FU followed by concurrent chemo with weekly cisplatin and radiation. He only received 2 doses of chemo due to severe mucositis, nausea and weight loss.    04/05/2007 - 08/04/2007 Chemotherapy    He received 4 more courses of cisplatin with 5FU and had complete response    07/05/2009 Procedure    Fine-needle aspirate of the right level II lymph nodes come from recurrent metastatic disease. Repeat endoscopy and CT scan show no evidence of disease elsewhere.    07/08/2009 - 12/02/2009 Chemotherapy    He was given 6 cycles of carboplatin, 5-FU and docetaxel    12/03/2009 Surgery    He has surgery to the residual lymph node on the right neck which showed no evidence of disease.    02/22/2012 Imaging    Repeat imaging study showed large recurrent mass. He was referred elsewhere for further treatment.    05/03/2012 Surgery  He underwent left upper lobectomy.    04/29/2013 Imaging    PEt scan showed lesion on right level II B and lower lung was abnormal    06/03/2013 - 02/02/2014 Chemotherapy    He had 6 cycles of chemotherapy when he was found to have recurrence of cancer and had received oxaliplatin and capecitabine     06/07/2014 Imaging    PET CT scan showed persistent disease in the right neck lymph nodes and left lung    06/29/2014 Procedure    Accession: [id number] repeat LUL biopsy confirmed metastatic cancer    07/18/2014 - 07/31/2014 Radiation Therapy    He received palliative radiation therapy to the lungs    10/10/2014 Imaging    CT scan of the chest, abdomen and pelvis show regression in the size of the lung nodule in the left upper lobe and stable pulmonary nodules    01/24/2015 Imaging    CT scan showed stable disease in neck and lung    06/19/2015 Imaging    CT scan of the neck and the chest show possible mild progression of the nodule in the right side of the neck.    06/25/2015 Imaging    PET scan confirmed disease recurrence in the neck    07/07/2015 Imaging    He had MRI neck at [facility]    09/03/2015 - 08/26/2018 Chemotherapy    He received palliative chemo with Nivolumab    10/29/2015 Imaging    PET CT showed positive response to Rx    02/28/2016 Imaging    Ct abdomen showed abnormal thinkening in his stomach    03/03/2016 Imaging    CT: Right sternocleidomastoid muscle metastasis appears less distinct but otherwise not significantly changed in size or configuration since 06/19/2015.2. Left level 3 lymph node which was hypermetabolic by PET-CT in January 2017 appears slightly smaller    04/01/2016 Imaging    CT cervical spine showed no acute fracture or traumatic malalignment in the cervical spine    04/22/2016 Procedure    Port-a-cath placed.    06/16/2016 Imaging    Ct neck showed right sternocleidomastoid muscle metastasis is further decreased in conspicuity since May, and has mildly decreased in size since September 2016. Continued stability of sub-centimeter left cervical lymph nodes. No new or progressive metastatic disease in the neck.    06/16/2016 Imaging    CT chest showed stable masslike radiation fibrosis in the left upper lobe. Stable subcentimeter pulmonary nodules in the  bilateral lower lobes. No new or progressive metastatic disease in the chest. Nonobstructing left renal stone.    10/13/2016 Imaging    Ct neck showed unchanged right sternocleidomastoid muscle metastasis. Unchanged subcentimeter left cervical lymph nodes. No evidence of new or progressive metastatic disease in the neck.    10/13/2016 Imaging    CT chest showed tiny hypervascular foci in the liver, not definitely seen on prior imaging of 06/16/2016 and 02/28/2016. Abdomen MRI without and with contrast recommended to further evaluate as metastatic disease is a concern. 2. Stable appearance of post treatment changes left upper lung and scattered tiny bilateral pulmonary nodules.    02/11/2017 Imaging    Ct neck: Lymph node mass right posterior neck appears improved from the prior study. Small posterior lymph nodes on the left unchanged. Occluded right jugular vein unchanged.    02/11/2017 Imaging    1. Similar appearance of postsurgical and radiation changes in the left upper lobe. 2. Similar bilateral pulmonary nodules. 3. No thoracic adenopathy. 4. Subtle  foci of post-contrast enhancement within the liver are suboptimally characterized on this nondedicated study. Likely similar. These could either be re-evaluated at followup or more entirely characterized with abdominal MRI. 5. Left nephrolithiasis.    05/19/2017 Imaging    Matted lymph node mass right posterior neck appears larger in the recent CT. Accurate measurements difficult due to infiltrating tumor margins and infiltration of the muscle. Right jugular vein again appears occluded or resected. Small left posterior lymph nodes stable. Left upper lobe airspace density stable and similar to the prior CT    06/03/2017 PET scan    1. Hypermetabolic ill-defined right level IIb lymph node, about 1.3 cm in diameter with maximum SUV 9.5 (formerly 8.1). Appearance suspicious for residual/recurrent malignancy. No worrisome left-sided lesion. 2. Left  suprahilar indistinct opacity demonstrates no worrisome hypermetabolic activity. The 5 mm left lower lobe pulmonary nodule is stable and not currently hypermetabolic although below sensitive PET-CT size thresholds. 3. Other imaging findings of potential clinical significance: Bilateral nonobstructive nephrolithiasis. Chronic bilateral maxillary sinusitis.    05/11/2018 PET scan    1. Continued chronic accentuated metabolic activity in the vicinity of right level IIB and the adjacent right sternocleidomastoid muscle, with ill definition of surrounding tissue planes. Maximum SUV is currently 8.1, formerly 9.5. Accentuated metabolic activity is been present in this vicinity back through 06/25/2015, and there was also some low-level activity in this vicinity on 06/07/2014. Some of this may be from scarring and local muscular activity although clearly a component of residual tumor is difficult to exclude given the focally high activity. 2. Other imaging findings of potential clinical significance: Chronic bilateral maxillary sinusitis. Chronic scarring in the left upper lobe. Chronically stable 5 mm left lower lobe nodule is considered benign. Nonobstructive left nephrolithiasis.    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 COMMENT:No significant keratinization is identified. Other basaloid carcinomas are in the differential diagnosis. No cell block material is available for further testing.    09/12/2018 Procedure    He underwent fine Needle Aspiration    10/04/2018 PET scan    1. Significant progression of local recurrence laterally in the mid right neck with an enlarging, increasingly hypermetabolic soft tissue mass. This involves the right sternocleidomastoid muscle. 2. Small lymph nodes in the right axilla are increasingly hypermetabolic. These are  nonspecific and potentially reactive, although could reflect a small metastases. Small hypermetabolic nodule in the left suprasternal notch is unchanged. 3. No other evidence of metastatic disease.     10/07/2018 -  Chemotherapy    The patient had cisplatin plus gemzar     REVIEW OF SYSTEMS:   Constitutional: Denies fevers, chills  Eyes: Denies blurriness of vision Ears, nose, mouth, throat, and face: Denies mucositis or sore throat Respiratory: Denies cough, dyspnea or wheezes Cardiovascular: Denies palpitation, chest discomfort or lower extremity swelling Skin: Denies abnormal skin rashes Lymphatics: Denies new lymphadenopathy or easy bruising Behavioral/Psych: Mood is stable, no new changes  All other systems were reviewed with the patient and are negative.  I have reviewed the past medical history, past surgical history, social history and family history with the patient and they are unchanged from previous note.  ALLERGIES:  is allergic to phenergan [promethazine hcl]; heparin; and clindamycin.  MEDICATIONS:  Current Outpatient Medications  Medication Sig Dispense Refill  . cetirizine (ZYRTEC) 10 MG tablet Take 10 mg by mouth daily.    Marland Kitchen  levothyroxine (SYNTHROID, LEVOTHROID) 137 MCG tablet TAKE 1 TABLET(137 MCG) BY MOUTH DAILY BEFORE BREAKFAST (Patient taking differently: Take 137 mcg by mouth daily before breakfast. ) 90 tablet 0  . LORazepam (ATIVAN) 1 MG tablet Take 1 tablet (1 mg total) by mouth every 8 (eight) hours as needed for anxiety. 30 tablet 3  . mirtazapine (REMERON) 15 MG tablet Take 1 tablet (15 mg total) by mouth at bedtime. 30 tablet 1  . omeprazole (PRILOSEC) 20 MG capsule Take 1 capsule (20 mg total) by mouth daily. 30 capsule 5  . ondansetron (ZOFRAN) 8 MG tablet Take 1 tablet (8 mg total) by mouth every 8 (eight) hours as needed for nausea or vomiting. 90 tablet 1  . oxyCODONE (ROXICODONE) 15 MG immediate release tablet Take 1 tablet (15 mg total) by mouth  every 4 (four) hours as needed for pain. 60 tablet 0  . PARoxetine (PAXIL) 20 MG tablet Take 1 tablet (20 mg total) by mouth daily. 30 tablet 5  . polyethylene glycol (MIRALAX) packet Take 17 g by mouth daily. (Patient taking differently: Take 17 g by mouth daily as needed for mild constipation or moderate constipation. ) 14 each 3  . prochlorperazine (COMPAZINE) 10 MG tablet Take 1 tablet (10 mg total) by mouth every 6 (six) hours as needed (Nausea or vomiting). 30 tablet 1  . senna-docusate (SENOKOT-S) 8.6-50 MG tablet Take 2 tablets by mouth 3 (three) times daily. 90 tablet 0   No current facility-administered medications for this visit.     PHYSICAL EXAMINATION: ECOG PERFORMANCE STATUS: 2 - Symptomatic, <50% confined to bed  Vitals:   11/04/18 0928  BP: 127/80  Pulse: 81  Resp: 18  Temp: 97.8 F (36.6 C)  SpO2: 100%   Filed Weights   11/04/18 0928  Weight: 186 lb (84.4 kg)    GENERAL:alert, no distress and comfortable NEURO: alert & oriented x 3 with fluent speech, no focal motor/sensory deficits  LABORATORY DATA:  I have reviewed the data as listed    Component Value Date/Time   NA 137 11/04/2018 0903   NA 139 09/22/2017 0829   K 3.9 11/04/2018 0903   K 3.5 09/22/2017 0829   CL 101 11/04/2018 0903   CO2 26 11/04/2018 0903   CO2 26 09/22/2017 0829   GLUCOSE 130 (H) 11/04/2018 0903   GLUCOSE 133 09/22/2017 0829   BUN 13 11/04/2018 0903   BUN 14.1 09/22/2017 0829   CREATININE 0.98 11/04/2018 0903   CREATININE 0.9 09/22/2017 0829   CALCIUM 9.5 11/04/2018 0903   CALCIUM 9.1 09/22/2017 0829   PROT 7.1 11/04/2018 0903   PROT 6.8 09/22/2017 0829   ALBUMIN 4.2 11/04/2018 0903   ALBUMIN 4.1 09/22/2017 0829   AST 28 11/04/2018 0903   AST 22 09/22/2017 0829   ALT 53 (H) 11/04/2018 0903   ALT 30 09/22/2017 0829   ALKPHOS 71 11/04/2018 0903   ALKPHOS 55 09/22/2017 0829   BILITOT 0.3 11/04/2018 0903   BILITOT 0.35 09/22/2017 0829   GFRNONAA >60 11/04/2018 0903    GFRAA >60 11/04/2018 0903    No results found for: SPEP, UPEP  Lab Results  Component Value Date   WBC 7.0 11/04/2018   NEUTROABS 3.9 11/04/2018   HGB 12.9 (L) 11/04/2018   HCT 37.3 (L) 11/04/2018   MCV 86.7 11/04/2018   PLT 335 11/04/2018      Chemistry      Component Value Date/Time   NA 137 11/04/2018 0903   NA 139  09/22/2017 0829   K 3.9 11/04/2018 0903   K 3.5 09/22/2017 0829   CL 101 11/04/2018 0903   CO2 26 11/04/2018 0903   CO2 26 09/22/2017 0829   BUN 13 11/04/2018 0903   BUN 14.1 09/22/2017 0829   CREATININE 0.98 11/04/2018 0903   CREATININE 0.9 09/22/2017 0829      Component Value Date/Time   CALCIUM 9.5 11/04/2018 0903   CALCIUM 9.1 09/22/2017 0829   ALKPHOS 71 11/04/2018 0903   ALKPHOS 55 09/22/2017 0829   AST 28 11/04/2018 0903   AST 22 09/22/2017 0829   ALT 53 (H) 11/04/2018 0903   ALT 30 09/22/2017 0829   BILITOT 0.3 11/04/2018 0903   BILITOT 0.35 09/22/2017 0829       RADIOGRAPHIC STUDIES: I have personally reviewed the radiological images as listed and agreed with the findings in the report. Dg Chest 2 View  Result Date: 10/30/2018 CLINICAL DATA:  Dyspnea, anxiety. EXAM: CHEST - 2 VIEW COMPARISON:  Most recent radiograph 10/09/2018. Head CT 10/04/2018 FINDINGS: Right chest port with tip in the mid SVC. Normal heart size and mediastinal contours. Right apical and left suprahilar scarring. No new airspace disease. Small left lower lobe nodule on prior PET is not seen radiographically. No pulmonary edema, pleural effusion, or pneumothorax. No acute osseous abnormalities are seen. IMPRESSION: No acute cardiopulmonary findings. Stable scarring. Electronically Signed   By: Keith Rake M.D.   On: 10/30/2018 20:00   Dg Chest 2 View  Result Date: 10/09/2018 CLINICAL DATA:  Chest pain. Anxiety. 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 EXAM: CHEST - 2 VIEW COMPARISON:  PET-CT 10/04/2018 FINDINGS: Right chest port with tip at the atrial  caval junction. Heart is normal in size. Normal mediastinal contours. Left upper lobe scarring. No acute airspace disease. No pleural effusion or pneumothorax. No pulmonary edema. Small pulmonary nodule on CT is not seen radiographically. No acute osseous abnormalities are seen. IMPRESSION: No acute chest findings.  Left upper lobe scarring. Electronically Signed   By: Keith Rake M.D.   On: 10/09/2018 02:43    All questions were answered. The patient knows to call the clinic with any problems, questions or concerns. No barriers to learning was detected.  I spent 25 minutes counseling the patient face to face. The total time spent in the appointment was 30 minutes and more than 50% was on counseling and review of test results  [name], MD 11/04/2018 2:32 PM

## 2018-11-04 NOTE — Telephone Encounter (Signed)
Gave avs and calendar ° °

## 2018-11-05 ENCOUNTER — Inpatient Hospital Stay: Payer: Medicaid Other | Attending: Hematology and Oncology

## 2018-11-05 VITALS — BP 154/98 | HR 80 | Temp 98.7°F | Resp 20

## 2018-11-05 DIAGNOSIS — Z9221 Personal history of antineoplastic chemotherapy: Secondary | ICD-10-CM | POA: Diagnosis not present

## 2018-11-05 DIAGNOSIS — R112 Nausea with vomiting, unspecified: Secondary | ICD-10-CM | POA: Diagnosis not present

## 2018-11-05 DIAGNOSIS — R918 Other nonspecific abnormal finding of lung field: Secondary | ICD-10-CM | POA: Insufficient documentation

## 2018-11-05 DIAGNOSIS — H9313 Tinnitus, bilateral: Secondary | ICD-10-CM | POA: Diagnosis not present

## 2018-11-05 DIAGNOSIS — C119 Malignant neoplasm of nasopharynx, unspecified: Secondary | ICD-10-CM | POA: Diagnosis present

## 2018-11-05 DIAGNOSIS — Z79899 Other long term (current) drug therapy: Secondary | ICD-10-CM | POA: Diagnosis not present

## 2018-11-05 DIAGNOSIS — C7800 Secondary malignant neoplasm of unspecified lung: Secondary | ICD-10-CM

## 2018-11-05 DIAGNOSIS — Z5111 Encounter for antineoplastic chemotherapy: Secondary | ICD-10-CM | POA: Insufficient documentation

## 2018-11-05 DIAGNOSIS — D61818 Other pancytopenia: Secondary | ICD-10-CM | POA: Insufficient documentation

## 2018-11-05 DIAGNOSIS — Z923 Personal history of irradiation: Secondary | ICD-10-CM | POA: Insufficient documentation

## 2018-11-05 DIAGNOSIS — C760 Malignant neoplasm of head, face and neck: Secondary | ICD-10-CM

## 2018-11-05 DIAGNOSIS — F419 Anxiety disorder, unspecified: Secondary | ICD-10-CM | POA: Diagnosis not present

## 2018-11-05 DIAGNOSIS — Z95828 Presence of other vascular implants and grafts: Secondary | ICD-10-CM

## 2018-11-05 DIAGNOSIS — G893 Neoplasm related pain (acute) (chronic): Secondary | ICD-10-CM | POA: Diagnosis not present

## 2018-11-05 MED ORDER — ONDANSETRON HCL 4 MG/2ML IJ SOLN
8.0000 mg | Freq: Once | INTRAMUSCULAR | Status: AC
  Administered 2018-11-05: 8 mg via INTRAVENOUS

## 2018-11-05 MED ORDER — SODIUM CHLORIDE 0.9 % IV SOLN
Freq: Once | INTRAVENOUS | Status: AC
  Administered 2018-11-05: 10:00:00 via INTRAVENOUS
  Filled 2018-11-05: qty 250

## 2018-11-05 MED ORDER — SODIUM CHLORIDE 0.9% FLUSH
10.0000 mL | Freq: Once | INTRAVENOUS | Status: AC | PRN
  Administered 2018-11-05: 10 mL
  Filled 2018-11-05: qty 10

## 2018-11-05 MED ORDER — ONDANSETRON HCL 4 MG/2ML IJ SOLN
INTRAMUSCULAR | Status: AC
  Filled 2018-11-05: qty 4

## 2018-11-05 MED ORDER — SODIUM CHLORIDE 0.9 % IV SOLN: Freq: Once | INTRAVENOUS | Status: DC

## 2018-11-05 MED ORDER — ANTICOAGULANT SODIUM CITRATE 4% (200MG/5ML) IV SOLN
5.0000 mL | Freq: Once | Status: AC
  Administered 2018-11-05: 5 mL

## 2018-11-05 NOTE — Patient Instructions (Signed)
Dehydration, Adult  Dehydration is when there is not enough fluid or water in your body. This happens when you lose more fluids than you take in. Dehydration can range from mild to very bad. It should be treated right away to keep it from getting very bad. Symptoms of mild dehydration may include:  Thirst.  Dry lips.  Slightly dry mouth.  Dry, warm skin.  Dizziness. Symptoms of moderate dehydration may include:  Very dry mouth.  Muscle cramps.  Dark pee (urine). Pee may be the color of tea.  Your body making less pee.  Your eyes making fewer tears.  Heartbeat that is uneven or faster than normal (palpitations).  Headache.  Light-headedness, especially when you stand up from sitting.  Fainting (syncope). Symptoms of very bad dehydration may include:  Changes in skin, such as: ? Cold and clammy skin. ? Blotchy (mottled) or pale skin. ? Skin that does not quickly return to normal after being lightly pinched and let go (poor skin turgor).  Changes in body fluids, such as: ? Feeling very thirsty. ? Your eyes making fewer tears. ? Not sweating when body temperature is high, such as in hot weather. ? Your body making very little pee.  Changes in vital signs, such as: ? Weak pulse. ? Pulse that is more than 100 beats a minute when you are sitting still. ? Fast breathing. ? Low blood pressure.  Other changes, such as: ? Sunken eyes. ? Cold hands and feet. ? Confusion. ? Lack of energy (lethargy). ? Trouble waking up from sleep. ? Short-term weight loss. ? Unconsciousness. Follow these instructions at home:   If told by your doctor, drink an ORS: ? Make an ORS by using instructions on the package. ? Start by drinking small amounts, about  cup (120 mL) every 5-10 minutes. ? Slowly drink more until you have had the amount that your doctor said to have.  Drink enough clear fluid to keep your pee clear or pale yellow. If you were told to drink an ORS, finish the  ORS first, then start slowly drinking clear fluids. Drink fluids such as: ? Water. Do not drink only water by itself. Doing that can make the salt (sodium) level in your body get too low (hyponatremia). ? Ice chips. ? Fruit juice that you have added water to (diluted). ? Low-calorie sports drinks.  Avoid: ? Alcohol. ? Drinks that have a lot of sugar. These include high-calorie sports drinks, fruit juice that does not have water added, and soda. ? Caffeine. ? Foods that are greasy or have a lot of fat or sugar.  Take over-the-counter and prescription medicines only as told by your doctor.  Do not take salt tablets. Doing that can make the salt level in your body get too high (hypernatremia).  Eat foods that have minerals (electrolytes). Examples include bananas, oranges, potatoes, tomatoes, and spinach.  Keep all follow-up visits as told by your doctor. This is important. Contact a doctor if:  You have belly (abdominal) pain that: ? Gets worse. ? Stays in one area (localizes).  You have a rash.  You have a stiff neck.  You get angry or annoyed more easily than normal (irritability).  You are more sleepy than normal.  You have a harder time waking up than normal.  You feel: ? Weak. ? Dizzy. ? Very thirsty.  You have peed (urinated) only a small amount of very dark pee during 6-8 hours. Get help right away if:  You have   symptoms of very bad dehydration.  You cannot drink fluids without throwing up (vomiting).  Your symptoms get worse with treatment.  You have a fever.  You have a very bad headache.  You are throwing up or having watery poop (diarrhea) and it: ? Gets worse. ? Does not go away.  You have blood or something green (bile) in your throw-up.  You have blood in your poop (stool). This may cause poop to look black and tarry.  You have not peed in 6-8 hours.  You pass out (faint).  Your heart rate when you are sitting still is more than 100 beats a  minute.  You have trouble breathing. This information is not intended to replace advice given to you by your health care provider. Make sure you discuss any questions you have with your health care provider. Document Released: 07/18/2009 Document Revised: 04/10/2016 Document Reviewed: 11/15/2015 Elsevier Interactive Patient Education  2019 [employer].  

## 2018-11-07 ENCOUNTER — Encounter (HOSPITAL_COMMUNITY): Payer: Medicaid Other

## 2018-11-07 ENCOUNTER — Telehealth: Payer: Self-pay

## 2018-11-07 NOTE — Telephone Encounter (Signed)
Called and given below message. He verbalized understanding. 

## 2018-11-07 NOTE — Telephone Encounter (Signed)
He called and left a message he is too sleepy to drive and does not feel like he needs IV fluids. Called back and verified above message.  Sickle cell office called and canceled today's appts.

## 2018-11-07 NOTE — Telephone Encounter (Signed)
Tell him to reduce ativan to only as needed

## 2018-11-08 ENCOUNTER — Telehealth: Payer: Self-pay

## 2018-11-08 ENCOUNTER — Encounter (HOSPITAL_COMMUNITY): Payer: Medicaid Other

## 2018-11-08 NOTE — Telephone Encounter (Signed)
Ok to stop remeron

## 2018-11-08 NOTE — Telephone Encounter (Signed)
He called and left a message to call him.  Called back. He wants to cancel IV fluid appt for today, 2/5 and 2/6. He is drinking 2 liters of fluids a day and does not feel like he needs any IV fluids. He still sleepy and thinks that it is coming from the Remeron. He stopped the Lorazepam yesterday and does not feel like he it.  Canceled appts at sickle cell.

## 2018-11-08 NOTE — Telephone Encounter (Signed)
Called and left below message. Ask him to call the office for questions.

## 2018-11-09 ENCOUNTER — Encounter (HOSPITAL_COMMUNITY): Payer: Medicaid Other

## 2018-11-09 NOTE — Progress Notes (Signed)
The patient was seen in the infusion room.  He was seen most recently yesterday when he was having what appeared to be a panic attack.  He had been taking Compazine every 6 hours regularly.  He has not taken any today and has not had anxiety, hyperactivity, or panic attacks.  He continues on Paxil 20 mg once daily.  He was given Zofran 4 mg IV x1.  His nausea improved.  Sandi Mealy, MHS, PA-C Physician Assistant

## 2018-11-10 ENCOUNTER — Other Ambulatory Visit: Payer: Self-pay | Admitting: Hematology and Oncology

## 2018-11-10 ENCOUNTER — Encounter (HOSPITAL_COMMUNITY): Payer: Medicaid Other

## 2018-11-11 ENCOUNTER — Inpatient Hospital Stay: Payer: Medicaid Other

## 2018-11-11 ENCOUNTER — Inpatient Hospital Stay (HOSPITAL_BASED_OUTPATIENT_CLINIC_OR_DEPARTMENT_OTHER): Payer: Medicaid Other | Admitting: Hematology and Oncology

## 2018-11-11 ENCOUNTER — Encounter: Payer: Self-pay | Admitting: Hematology and Oncology

## 2018-11-11 ENCOUNTER — Telehealth: Payer: Self-pay | Admitting: Hematology and Oncology

## 2018-11-11 DIAGNOSIS — C119 Malignant neoplasm of nasopharynx, unspecified: Secondary | ICD-10-CM

## 2018-11-11 DIAGNOSIS — R112 Nausea with vomiting, unspecified: Secondary | ICD-10-CM | POA: Diagnosis not present

## 2018-11-11 DIAGNOSIS — Z95828 Presence of other vascular implants and grafts: Secondary | ICD-10-CM

## 2018-11-11 DIAGNOSIS — G893 Neoplasm related pain (acute) (chronic): Secondary | ICD-10-CM

## 2018-11-11 DIAGNOSIS — C760 Malignant neoplasm of head, face and neck: Secondary | ICD-10-CM

## 2018-11-11 DIAGNOSIS — C7800 Secondary malignant neoplasm of unspecified lung: Secondary | ICD-10-CM

## 2018-11-11 DIAGNOSIS — Z7189 Other specified counseling: Secondary | ICD-10-CM

## 2018-11-11 DIAGNOSIS — D61818 Other pancytopenia: Secondary | ICD-10-CM

## 2018-11-11 DIAGNOSIS — Z5111 Encounter for antineoplastic chemotherapy: Secondary | ICD-10-CM | POA: Diagnosis not present

## 2018-11-11 DIAGNOSIS — H9313 Tinnitus, bilateral: Secondary | ICD-10-CM

## 2018-11-11 LAB — COMPREHENSIVE METABOLIC PANEL
ALT: 24 U/L (ref 0–44)
AST: 17 U/L (ref 15–41)
Albumin: 3.7 g/dL (ref 3.5–5.0)
Alkaline Phosphatase: 59 U/L (ref 38–126)
Anion gap: 10 (ref 5–15)
BUN: 13 mg/dL (ref 6–20)
CALCIUM: 9 mg/dL (ref 8.9–10.3)
CO2: 27 mmol/L (ref 22–32)
Chloride: 104 mmol/L (ref 98–111)
Creatinine, Ser: 0.85 mg/dL (ref 0.61–1.24)
GFR calc Af Amer: >60 mL/min (ref >60)
Glucose, Bld: 160 mg/dL — ABNORMAL HIGH (ref 70–99)
Potassium: 3.8 mmol/L (ref 3.5–5.1)
Sodium: 141 mmol/L (ref 135–145)
Total Bilirubin: 0.4 mg/dL (ref 0.3–1.2)
Total Protein: 6.5 g/dL (ref 6.5–8.1)

## 2018-11-11 LAB — CBC WITH DIFFERENTIAL/PLATELET
Abs Immature Granulocytes: 0.03 10*3/uL (ref 0.00–0.07)
Basophils Absolute: 0 10*3/uL (ref 0.0–0.1)
Basophils Relative: 1 %
Eosinophils Absolute: 0.1 10*3/uL (ref 0.0–0.5)
Eosinophils Relative: 2 %
HEMATOCRIT: 36.6 % — AB (ref 39.0–52.0)
Hemoglobin: 11.9 g/dL — ABNORMAL LOW (ref 13.0–17.0)
Immature Granulocytes: 1 %
Lymphocytes Relative: 21 %
Lymphs Abs: 1 10*3/uL (ref 0.7–4.0)
MCH: 29.8 pg (ref 26.0–34.0)
MCHC: 32.5 g/dL (ref 30.0–36.0)
MCV: 91.5 fL (ref 80.0–100.0)
MONO ABS: 0.5 10*3/uL (ref 0.1–1.0)
MONOS PCT: 11 %
Neutro Abs: 3.2 10*3/uL (ref 1.7–7.7)
Neutrophils Relative %: 64 %
Platelets: 103 10*3/uL — ABNORMAL LOW (ref 150–400)
RBC: 4 MIL/uL — ABNORMAL LOW (ref 4.22–5.81)
RDW: 15.3 % (ref 11.5–15.5)
WBC: 5 10*3/uL (ref 4.0–10.5)
nRBC: 0 % (ref 0.0–0.2)

## 2018-11-11 LAB — MAGNESIUM: Magnesium: 1.8 mg/dL (ref 1.7–2.4)

## 2018-11-11 MED ORDER — SODIUM CHLORIDE 0.9 % IV SOLN
Freq: Once | INTRAVENOUS | Status: AC
  Administered 2018-11-11: 12:00:00 via INTRAVENOUS
  Filled 2018-11-11: qty 250

## 2018-11-11 MED ORDER — ONDANSETRON HCL 8 MG PO TABS
8.0000 mg | ORAL_TABLET | Freq: Once | ORAL | Status: AC
  Administered 2018-11-11: 8 mg via ORAL

## 2018-11-11 MED ORDER — ONDANSETRON HCL 4 MG/2ML IJ SOLN
INTRAMUSCULAR | Status: AC
  Filled 2018-11-11: qty 4

## 2018-11-11 MED ORDER — ONDANSETRON HCL 8 MG PO TABS
ORAL_TABLET | ORAL | Status: AC
  Filled 2018-11-11: qty 1

## 2018-11-11 MED ORDER — SODIUM CHLORIDE 0.9% FLUSH
10.0000 mL | Freq: Once | INTRAVENOUS | Status: AC
  Administered 2018-11-11: 10 mL
  Filled 2018-11-11: qty 10

## 2018-11-11 MED ORDER — SODIUM CHLORIDE 0.9% FLUSH
10.0000 mL | INTRAVENOUS | Status: DC | PRN
  Administered 2018-11-11: 10 mL
  Filled 2018-11-11: qty 10

## 2018-11-11 MED ORDER — SODIUM CHLORIDE 0.9 % IV SOLN
2000.0000 mg | Freq: Once | INTRAVENOUS | Status: AC
  Administered 2018-11-11: 2000 mg via INTRAVENOUS
  Filled 2018-11-11: qty 52.6

## 2018-11-11 MED ORDER — ANTICOAGULANT SODIUM CITRATE 4% (200MG/5ML) IV SOLN
5.0000 mL | Freq: Once | Status: AC
  Administered 2018-11-11: 5 mL
  Filled 2018-11-11: qty 5

## 2018-11-11 NOTE — Assessment & Plan Note (Signed)
He denies pain in the neck The lesion in the neck is resolving He is attempting to taper off oxycodone due to withdrawal symptoms I recommend extra strength Tylenol

## 2018-11-11 NOTE — Assessment & Plan Note (Signed)
He has mild pancytopenia from treatment He is not symptomatic Observe only.

## 2018-11-11 NOTE — Assessment & Plan Note (Signed)
He had recent intractable nausea and vomiting due to chemotherapy He will continue antiemetics as needed

## 2018-11-11 NOTE — Patient Instructions (Signed)
[facility] Discharge Instructions for Patients Receiving Chemotherapy  Today you received the following chemotherapy agents Gemzar To help prevent nausea and vomiting after your treatment, we encourage you to take your nausea medication as prescribed.  If you develop nausea and vomiting that is not controlled by your nausea medication, call the clinic.   BELOW ARE SYMPTOMS THAT SHOULD BE REPORTED IMMEDIATELY:  *FEVER GREATER THAN 100.5 F  *CHILLS WITH OR WITHOUT FEVER  NAUSEA AND VOMITING THAT IS NOT CONTROLLED WITH YOUR NAUSEA MEDICATION  *UNUSUAL SHORTNESS OF BREATH  *UNUSUAL BRUISING OR BLEEDING  TENDERNESS IN MOUTH AND THROAT WITH OR WITHOUT PRESENCE OF ULCERS  *URINARY PROBLEMS  *BOWEL PROBLEMS  UNUSUAL RASH Items with * indicate a potential emergency and should be followed up as soon as possible.  Feel free to call the clinic should you have any questions or concerns. The clinic phone number is [phone].  Please show the CHEMO ALERT CARD at check-in to the Emergency Department and triage nurse.   

## 2018-11-11 NOTE — Telephone Encounter (Signed)
Gave avs and calendar ° °

## 2018-11-11 NOTE — Assessment & Plan Note (Signed)
He continues to struggle with multiple different side effects of treatment With gemcitabine alone, he typically feels well We will proceed with gemcitabine today without additional support We discussed the risk and benefits of discontinuing cisplatin in the future The patient would like to proceed with 1 more dose in the near future I recommend 50% dose reduction with his next cycle of cisplatin I will see him again in 2 weeks before he proceed with cycle 3

## 2018-11-11 NOTE — Assessment & Plan Note (Signed)
He had bilateral tinnitus secondary to cisplatin I plan to reassess in 2 weeks and prescribe 50% dose reduction in the future

## 2018-11-11 NOTE — Progress Notes (Signed)
[facility] OFFICE PROGRESS NOTE  Patient Care Team: Patient, No Pcp Per as PCP - General (General Practice) [name], MD as Consulting Physician (Hematology and Oncology) Eppie Gibson, MD as Attending Physician (Radiation Oncology) Jodi Marble, MD as Consulting Physician (Otolaryngology) Philomena Doheny, MD as Referring Physician (Plastic Surgery) Irene Shipper, MD as Consulting Physician (Gastroenterology) System, Provider Not In  ASSESSMENT & PLAN:  Primary cancer of head and neck [facility]) He continues to struggle with multiple different side effects of treatment With gemcitabine alone, he typically feels well We will proceed with gemcitabine today without additional support We discussed the risk and benefits of discontinuing cisplatin in the future The patient would like to proceed with 1 more dose in the near future I recommend 50% dose reduction with his next cycle of cisplatin I will see him again in 2 weeks before he proceed with cycle 3  Cancer associated pain He denies pain in the neck The lesion in the neck is resolving He is attempting to taper off oxycodone due to withdrawal symptoms I recommend extra strength Tylenol  Pancytopenia, acquired ([place]) He has mild pancytopenia from treatment He is not symptomatic Observe only.  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   INTERVAL HISTORY: Please see below for problem oriented charting. He returns for further follow-up with family members He is eating better and is gaining weight He denies further nausea or vomiting No neuropathy He is experiencing some mild withdrawal symptoms from oxycodone.  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 Pan-endoscopy elsewhere showed cancer from nasopharyngeal space.    01/04/2007 - 02/20/2007 Chemotherapy    He received 2 cycles of cisplatin and 5FU followed by concurrent chemo with weekly cisplatin and radiation. He only received 2 doses of chemo due to severe mucositis, nausea and weight loss.    04/05/2007 - 08/04/2007 Chemotherapy    He received 4 more courses of cisplatin with 5FU and had complete response    07/05/2009 Procedure    Fine-needle aspirate of the right level II lymph nodes come from recurrent metastatic disease. Repeat endoscopy and CT scan show no evidence of disease elsewhere.    07/08/2009 - 12/02/2009 Chemotherapy    He was given 6 cycles of carboplatin, 5-FU and docetaxel    12/03/2009 Surgery    He has surgery to the residual lymph node on the right neck which showed no evidence of disease.    02/22/2012 Imaging    Repeat imaging study  showed large recurrent mass. He was referred elsewhere for further treatment.    05/03/2012 Surgery    He underwent left upper lobectomy.    04/29/2013 Imaging    PEt scan showed lesion on right level II B and lower lung was abnormal    06/03/2013 - 02/02/2014 Chemotherapy    He had 6 cycles of chemotherapy when he was found to have recurrence of cancer and had received  oxaliplatin and capecitabine    06/07/2014 Imaging    PET CT scan showed persistent disease in the right neck lymph nodes and left lung    06/29/2014 Procedure    Accession: [id number] repeat LUL biopsy confirmed metastatic cancer    07/18/2014 - 07/31/2014 Radiation Therapy    He received palliative radiation therapy to the lungs    10/10/2014 Imaging    CT scan of the chest, abdomen and pelvis show regression in the size of the lung nodule in the left upper lobe and stable pulmonary nodules    01/24/2015 Imaging    CT scan showed stable disease in neck and lung    06/19/2015 Imaging    CT scan of the neck and the chest show possible mild progression of the nodule in the right side of the neck.    06/25/2015 Imaging    PET scan confirmed disease recurrence in the neck    07/07/2015 Imaging    He had MRI neck at [facility]    09/03/2015 - 08/26/2018 Chemotherapy    He received palliative chemo with Nivolumab    10/29/2015 Imaging    PET CT showed positive response to Rx    02/28/2016 Imaging    Ct abdomen showed abnormal thinkening in his stomach    03/03/2016 Imaging    CT: Right sternocleidomastoid muscle metastasis appears less distinct but otherwise not significantly changed in size or configuration since 06/19/2015.2. Left level 3 lymph node which was hypermetabolic by PET-CT in January 2017 appears slightly smaller    04/01/2016 Imaging    CT cervical spine showed no acute fracture or traumatic malalignment in the cervical spine    04/22/2016 Procedure    Port-a-cath placed.    06/16/2016 Imaging    Ct neck showed right sternocleidomastoid muscle metastasis is further decreased in conspicuity since May, and has mildly decreased in size since September 2016. Continued stability of sub-centimeter left cervical lymph nodes. No new or progressive metastatic disease in the neck.    06/16/2016 Imaging    CT chest showed stable masslike radiation fibrosis in the left upper lobe. Stable  subcentimeter pulmonary nodules in the bilateral lower lobes. No new or progressive metastatic disease in the chest. Nonobstructing left renal stone.    10/13/2016 Imaging    Ct neck showed unchanged right sternocleidomastoid muscle metastasis. Unchanged subcentimeter left cervical lymph nodes. No evidence of new or progressive metastatic disease in the neck.    10/13/2016 Imaging    CT chest showed tiny hypervascular foci in the liver, not definitely seen on prior imaging of 06/16/2016 and 02/28/2016. Abdomen MRI without and with contrast recommended to further evaluate as metastatic disease is a concern. 2. Stable appearance of post treatment changes left upper lung and scattered tiny bilateral pulmonary nodules.    02/11/2017 Imaging    Ct neck: Lymph node mass right posterior neck appears improved from the prior study. Small posterior lymph nodes on the left unchanged. Occluded right jugular vein unchanged.    02/11/2017 Imaging    1. Similar appearance of postsurgical  and radiation changes in the left upper lobe. 2. Similar bilateral pulmonary nodules. 3. No thoracic adenopathy. 4. Subtle foci of post-contrast enhancement within the liver are suboptimally characterized on this nondedicated study. Likely similar. These could either be re-evaluated at followup or more entirely characterized with abdominal MRI. 5. Left nephrolithiasis.    05/19/2017 Imaging    Matted lymph node mass right posterior neck appears larger in the recent CT. Accurate measurements difficult due to infiltrating tumor margins and infiltration of the muscle. Right jugular vein again appears occluded or resected. Small left posterior lymph nodes stable. Left upper lobe airspace density stable and similar to the prior CT    06/03/2017 PET scan    1. Hypermetabolic ill-defined right level IIb lymph node, about 1.3 cm in diameter with maximum SUV 9.5 (formerly 8.1). Appearance suspicious for residual/recurrent malignancy. No  worrisome left-sided lesion. 2. Left suprahilar indistinct opacity demonstrates no worrisome hypermetabolic activity. The 5 mm left lower lobe pulmonary nodule is stable and not currently hypermetabolic although below sensitive PET-CT size thresholds. 3. Other imaging findings of potential clinical significance: Bilateral nonobstructive nephrolithiasis. Chronic bilateral maxillary sinusitis.    05/11/2018 PET scan    1. Continued chronic accentuated metabolic activity in the vicinity of right level IIB and the adjacent right sternocleidomastoid muscle, with ill definition of surrounding tissue planes. Maximum SUV is currently 8.1, formerly 9.5. Accentuated metabolic activity is been present in this vicinity back through 06/25/2015, and there was also some low-level activity in this vicinity on 06/07/2014. Some of this may be from scarring and local muscular activity although clearly a component of residual tumor is difficult to exclude given the focally high activity. 2. Other imaging findings of potential clinical significance: Chronic bilateral maxillary sinusitis. Chronic scarring in the left upper lobe. Chronically stable 5 mm left lower lobe nodule is considered benign. Nonobstructive left nephrolithiasis.    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 COMMENT:No significant keratinization is identified. Other basaloid carcinomas are in the differential diagnosis. No cell block material is available for further testing.    09/12/2018 Procedure    He underwent fine Needle Aspiration    10/04/2018 PET scan    1. Significant progression of local recurrence laterally in the mid right neck with an enlarging, increasingly hypermetabolic soft tissue mass. This involves the right sternocleidomastoid muscle. 2. Small lymph nodes in the right axilla are  increasingly hypermetabolic. These are nonspecific and potentially reactive, although could reflect a small metastases. Small hypermetabolic nodule in the left suprasternal notch is unchanged. 3. No other evidence of metastatic disease.     10/07/2018 -  Chemotherapy    The patient had cisplatin plus gemzar     REVIEW OF SYSTEMS:   Constitutional: Denies fevers, chills or abnormal weight loss Eyes: Denies blurriness of vision Ears, nose, mouth, throat, and face: Denies mucositis or sore throat Respiratory: Denies cough, dyspnea or wheezes Cardiovascular: Denies palpitation, chest discomfort or lower extremity swelling Skin: Denies abnormal skin rashes Lymphatics: Denies new lymphadenopathy or easy bruising All other systems were reviewed with the patient and are negative.  I have reviewed the past medical history, past surgical history, social history and family history with the patient and they are unchanged from previous note.  ALLERGIES:  is allergic to phenergan [promethazine hcl]; heparin; and clindamycin.  MEDICATIONS:  Current Outpatient Medications  Medication Sig Dispense Refill  .  cetirizine (ZYRTEC) 10 MG tablet Take 10 mg by mouth daily.    Marland Kitchen levothyroxine (SYNTHROID, LEVOTHROID) 137 MCG tablet TAKE 1 TABLET(137 MCG) BY MOUTH DAILY BEFORE BREAKFAST (Patient taking differently: Take 137 mcg by mouth daily before breakfast. ) 90 tablet 0  . LORazepam (ATIVAN) 1 MG tablet Take 1 tablet (1 mg total) by mouth every 8 (eight) hours as needed for anxiety. 30 tablet 3  . mirtazapine (REMERON) 15 MG tablet Take 1 tablet (15 mg total) by mouth at bedtime. 30 tablet 1  . omeprazole (PRILOSEC) 20 MG capsule Take 1 capsule (20 mg total) by mouth daily. 30 capsule 5  . ondansetron (ZOFRAN) 8 MG tablet Take 1 tablet (8 mg total) by mouth every 8 (eight) hours as needed for nausea or vomiting. 90 tablet 1  . oxyCODONE (ROXICODONE) 15 MG immediate release tablet Take 1 tablet (15 mg total)  by mouth every 4 (four) hours as needed for pain. 60 tablet 0  . PARoxetine (PAXIL) 20 MG tablet Take 1 tablet (20 mg total) by mouth daily. 30 tablet 5  . polyethylene glycol (MIRALAX) packet Take 17 g by mouth daily. (Patient taking differently: Take 17 g by mouth daily as needed for mild constipation or moderate constipation. ) 14 each 3  . prochlorperazine (COMPAZINE) 10 MG tablet Take 1 tablet (10 mg total) by mouth every 6 (six) hours as needed (Nausea or vomiting). 30 tablet 1  . senna-docusate (SENOKOT-S) 8.6-50 MG tablet Take 2 tablets by mouth 3 (three) times daily. 90 tablet 0   No current facility-administered medications for this visit.     PHYSICAL EXAMINATION: ECOG PERFORMANCE STATUS: 1 - Symptomatic but completely ambulatory  Vitals:   11/11/18 1031  BP: 116/75  Pulse: 87  Resp: 18  Temp: 98.3 F (36.8 C)  SpO2: 100%   Filed Weights   11/11/18 1031  Weight: 191 lb (86.6 kg)    GENERAL:alert, no distress and comfortable SKIN: skin color, texture, turgor are normal, no rashes or significant lesions.  The lesion on the right side of his neck is almost completely healed EYES: normal, Conjunctiva are pink and non-injected, sclera clear OROPHARYNX:no exudate, no erythema and lips, buccal mucosa, and tongue normal  NECK: supple, thyroid normal size, non-tender, without nodularity LYMPH:  no palpable lymphadenopathy in the cervical, axillary or inguinal LUNGS: clear to auscultation and percussion with normal breathing effort HEART: regular rate & rhythm and no murmurs and no lower extremity edema ABDOMEN:abdomen soft, non-tender and normal bowel sounds Musculoskeletal:no cyanosis of digits and no clubbing  NEURO: alert & oriented x 3 with fluent speech, no focal motor/sensory deficits  LABORATORY DATA:  I have reviewed the data as listed    Component Value Date/Time   NA 141 11/11/2018 0950   NA 139 09/22/2017 0829   K 3.8 11/11/2018 0950   K 3.5 09/22/2017 0829    CL 104 11/11/2018 0950   CO2 27 11/11/2018 0950   CO2 26 09/22/2017 0829   GLUCOSE 160 (H) 11/11/2018 0950   GLUCOSE 133 09/22/2017 0829   BUN 13 11/11/2018 0950   BUN 14.1 09/22/2017 0829   CREATININE 0.85 11/11/2018 0950   CREATININE 0.9 09/22/2017 0829   CALCIUM 9.0 11/11/2018 0950   CALCIUM 9.1 09/22/2017 0829   PROT 6.5 11/11/2018 0950   PROT 6.8 09/22/2017 0829   ALBUMIN 3.7 11/11/2018 0950   ALBUMIN 4.1 09/22/2017 0829   AST 17 11/11/2018 0950   AST 22 09/22/2017 0829   ALT  24 11/11/2018 0950   ALT 30 09/22/2017 0829   ALKPHOS 59 11/11/2018 0950   ALKPHOS 55 09/22/2017 0829   BILITOT 0.4 11/11/2018 0950   BILITOT 0.35 09/22/2017 0829   GFRNONAA >60 11/11/2018 0950   GFRAA >60 11/11/2018 0950    No results found for: SPEP, UPEP  Lab Results  Component Value Date   WBC 5.0 11/11/2018   NEUTROABS 3.2 11/11/2018   HGB 11.9 (L) 11/11/2018   HCT 36.6 (L) 11/11/2018   MCV 91.5 11/11/2018   PLT 103 (L) 11/11/2018      Chemistry      Component Value Date/Time   NA 141 11/11/2018 0950   NA 139 09/22/2017 0829   K 3.8 11/11/2018 0950   K 3.5 09/22/2017 0829   CL 104 11/11/2018 0950   CO2 27 11/11/2018 0950   CO2 26 09/22/2017 0829   BUN 13 11/11/2018 0950   BUN 14.1 09/22/2017 0829   CREATININE 0.85 11/11/2018 0950   CREATININE 0.9 09/22/2017 0829      Component Value Date/Time   CALCIUM 9.0 11/11/2018 0950   CALCIUM 9.1 09/22/2017 0829   ALKPHOS 59 11/11/2018 0950   ALKPHOS 55 09/22/2017 0829   AST 17 11/11/2018 0950   AST 22 09/22/2017 0829   ALT 24 11/11/2018 0950   ALT 30 09/22/2017 0829   BILITOT 0.4 11/11/2018 0950   BILITOT 0.35 09/22/2017 0829       RADIOGRAPHIC STUDIES: I have personally reviewed the radiological images as listed and agreed with the findings in the report. Dg Chest 2 View  Result Date: 10/30/2018 CLINICAL DATA:  Dyspnea, anxiety. EXAM: CHEST - 2 VIEW COMPARISON:  Most recent radiograph 10/09/2018. Head CT 10/04/2018  FINDINGS: Right chest port with tip in the mid SVC. Normal heart size and mediastinal contours. Right apical and left suprahilar scarring. No new airspace disease. Small left lower lobe nodule on prior PET is not seen radiographically. No pulmonary edema, pleural effusion, or pneumothorax. No acute osseous abnormalities are seen. IMPRESSION: No acute cardiopulmonary findings. Stable scarring. Electronically Signed   By: Keith Rake M.D.   On: 10/30/2018 20:00    All questions were answered. The patient knows to call the clinic with any problems, questions or concerns. No barriers to learning was detected.  I spent 25 minutes counseling the patient face to face. The total time spent in the appointment was 40 minutes and more than 50% was on counseling and review of test results  [name], MD 11/11/2018 11:14 AM

## 2018-11-24 ENCOUNTER — Inpatient Hospital Stay (HOSPITAL_BASED_OUTPATIENT_CLINIC_OR_DEPARTMENT_OTHER): Payer: Medicaid Other | Admitting: Hematology and Oncology

## 2018-11-24 ENCOUNTER — Inpatient Hospital Stay: Payer: Medicaid Other

## 2018-11-24 ENCOUNTER — Encounter: Payer: Self-pay | Admitting: Hematology and Oncology

## 2018-11-24 DIAGNOSIS — R112 Nausea with vomiting, unspecified: Secondary | ICD-10-CM

## 2018-11-24 DIAGNOSIS — G893 Neoplasm related pain (acute) (chronic): Secondary | ICD-10-CM

## 2018-11-24 DIAGNOSIS — C760 Malignant neoplasm of head, face and neck: Secondary | ICD-10-CM

## 2018-11-24 DIAGNOSIS — C119 Malignant neoplasm of nasopharynx, unspecified: Secondary | ICD-10-CM

## 2018-11-24 DIAGNOSIS — H9313 Tinnitus, bilateral: Secondary | ICD-10-CM

## 2018-11-24 DIAGNOSIS — Z5111 Encounter for antineoplastic chemotherapy: Secondary | ICD-10-CM | POA: Diagnosis not present

## 2018-11-24 DIAGNOSIS — C7800 Secondary malignant neoplasm of unspecified lung: Secondary | ICD-10-CM

## 2018-11-24 LAB — CBC WITH DIFFERENTIAL/PLATELET
Abs Immature Granulocytes: 0.08 K/uL — ABNORMAL HIGH (ref 0.00–0.07)
Basophils Absolute: 0 K/uL (ref 0.0–0.1)
Basophils Relative: 1 %
Eosinophils Absolute: 0.2 K/uL (ref 0.0–0.5)
Eosinophils Relative: 3 %
HCT: 38.8 % — ABNORMAL LOW (ref 39.0–52.0)
Hemoglobin: 12.7 g/dL — ABNORMAL LOW (ref 13.0–17.0)
Immature Granulocytes: 2 %
Lymphocytes Relative: 28 %
Lymphs Abs: 1.5 K/uL (ref 0.7–4.0)
MCH: 30.5 pg (ref 26.0–34.0)
MCHC: 32.7 g/dL (ref 30.0–36.0)
MCV: 93.3 fL (ref 80.0–100.0)
Monocytes Absolute: 0.9 K/uL (ref 0.1–1.0)
Monocytes Relative: 18 %
Neutro Abs: 2.7 K/uL (ref 1.7–7.7)
Neutrophils Relative %: 48 %
Platelets: 228 K/uL (ref 150–400)
RBC: 4.16 MIL/uL — ABNORMAL LOW (ref 4.22–5.81)
RDW: 16.8 % — ABNORMAL HIGH (ref 11.5–15.5)
WBC: 5.4 K/uL (ref 4.0–10.5)
nRBC: 0.4 % — ABNORMAL HIGH (ref 0.0–0.2)

## 2018-11-24 LAB — COMPREHENSIVE METABOLIC PANEL WITH GFR
ALT: 24 U/L (ref 0–44)
AST: 20 U/L (ref 15–41)
Albumin: 4.2 g/dL (ref 3.5–5.0)
Alkaline Phosphatase: 63 U/L (ref 38–126)
Anion gap: 9 (ref 5–15)
BUN: 15 mg/dL (ref 6–20)
CO2: 29 mmol/L (ref 22–32)
Calcium: 9.4 mg/dL (ref 8.9–10.3)
Chloride: 103 mmol/L (ref 98–111)
Creatinine, Ser: 1.16 mg/dL (ref 0.61–1.24)
GFR calc Af Amer: >60 mL/min (ref >60)
GFR calc non Af Amer: >60 mL/min (ref >60)
Glucose, Bld: 151 mg/dL — ABNORMAL HIGH (ref 70–99)
Potassium: 4.2 mmol/L (ref 3.5–5.1)
Sodium: 141 mmol/L (ref 135–145)
Total Bilirubin: 0.3 mg/dL (ref 0.3–1.2)
Total Protein: 7.1 g/dL (ref 6.5–8.1)

## 2018-11-24 LAB — MAGNESIUM: Magnesium: 1.9 mg/dL (ref 1.7–2.4)

## 2018-11-24 NOTE — Assessment & Plan Note (Signed)
His symptoms has resolved Due to anticipated risk of side effects, despite 50% dose adjustment, I recommend daily IV fluid hydration and IV antiemetics.  He agreed

## 2018-11-24 NOTE — Assessment & Plan Note (Signed)
He has fully recovered from side effects of treatment We will proceed with cycle 3 with 50% dose adjustment of cisplatin I plan to repeat imaging after 3 cycles of treatment Clinically, he had excellent response to therapy

## 2018-11-24 NOTE — Progress Notes (Signed)
[facility] PROGRESS NOTE  Patient Care Team: Patient, No Pcp Per as PCP - General (General Practice) [name], MD as Consulting Physician (Hematology and Oncology) Eppie Gibson, MD as Attending Physician (Radiation Oncology) Jodi Marble, MD as Consulting Physician (Otolaryngology) Philomena Doheny, MD as Referring Physician (Plastic Surgery) Irene Shipper, MD as Consulting Physician (Gastroenterology) System, Provider Not In  ASSESSMENT & PLAN:  Primary cancer of head and neck [facility]) He has fully recovered from side effects of treatment We will proceed with cycle 3 with 50% dose adjustment of cisplatin I plan to repeat imaging after 3 cycles of treatment Clinically, he had excellent response to therapy  Cancer associated pain He has minimum pain.  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  He agreed   No orders of the defined types were placed in this encounter.   INTERVAL HISTORY: Please see below for problem oriented charting. He returns for further follow-up He returns with his wife to be seen prior to cycle 3 of chemotherapy He continues to have significant ringing in his ears and mild hearing loss He has minimum pain No further nausea or vomiting.  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 Pan-endoscopy elsewhere showed cancer from nasopharyngeal space.    01/04/2007 - 02/20/2007 Chemotherapy    He received 2 cycles of cisplatin and 5FU followed by concurrent chemo with weekly cisplatin and radiation. He only received 2 doses of chemo due to severe mucositis, nausea and weight loss.    04/05/2007 - 08/04/2007 Chemotherapy    He received 4 more courses of cisplatin with 5FU and had complete response    07/05/2009 Procedure    Fine-needle aspirate of the right level II lymph nodes come from recurrent metastatic disease. Repeat endoscopy and CT scan show no evidence of disease elsewhere.    07/08/2009 - 12/02/2009 Chemotherapy    He was given 6 cycles of carboplatin, 5-FU and docetaxel    12/03/2009 Surgery    He has surgery to the residual lymph node on the right neck which showed no evidence of disease.    02/22/2012 Imaging    Repeat imaging study showed large recurrent mass. He was referred elsewhere for further treatment.    05/03/2012 Surgery    He underwent left upper lobectomy.    04/29/2013 Imaging    PEt scan showed lesion on right level II B and lower lung was abnormal    06/03/2013 - 02/02/2014 Chemotherapy  He had 6 cycles of chemotherapy when he was found to have recurrence of cancer and had received oxaliplatin and capecitabine    06/07/2014 Imaging    PET CT scan showed persistent disease in the right neck lymph nodes and left lung    06/29/2014 Procedure    Accession: [id number] repeat LUL biopsy confirmed metastatic cancer    07/18/2014 - 07/31/2014 Radiation Therapy    He received palliative radiation therapy to the lungs    10/10/2014 Imaging    CT scan of the chest,  abdomen and pelvis show regression in the size of the lung nodule in the left upper lobe and stable pulmonary nodules    01/24/2015 Imaging    CT scan showed stable disease in neck and lung    06/19/2015 Imaging    CT scan of the neck and the chest show possible mild progression of the nodule in the right side of the neck.    06/25/2015 Imaging    PET scan confirmed disease recurrence in the neck    07/07/2015 Imaging    He had MRI neck at [facility]    09/03/2015 - 08/26/2018 Chemotherapy    He received palliative chemo with Nivolumab    10/29/2015 Imaging    PET CT showed positive response to Rx    02/28/2016 Imaging    Ct abdomen showed abnormal thinkening in his stomach    03/03/2016 Imaging    CT: Right sternocleidomastoid muscle metastasis appears less distinct but otherwise not significantly changed in size or configuration since 06/19/2015.2. Left level 3 lymph node which was hypermetabolic by PET-CT in January 2017 appears slightly smaller    04/01/2016 Imaging    CT cervical spine showed no acute fracture or traumatic malalignment in the cervical spine    04/22/2016 Procedure    Port-a-cath placed.    06/16/2016 Imaging    Ct neck showed right sternocleidomastoid muscle metastasis is further decreased in conspicuity since May, and has mildly decreased in size since September 2016. Continued stability of sub-centimeter left cervical lymph nodes. No new or progressive metastatic disease in the neck.    06/16/2016 Imaging    CT chest showed stable masslike radiation fibrosis in the left upper lobe. Stable subcentimeter pulmonary nodules in the bilateral lower lobes. No new or progressive metastatic disease in the chest. Nonobstructing left renal stone.    10/13/2016 Imaging    Ct neck showed unchanged right sternocleidomastoid muscle metastasis. Unchanged subcentimeter left cervical lymph nodes. No evidence of new or progressive metastatic disease in the neck.    10/13/2016 Imaging    CT  chest showed tiny hypervascular foci in the liver, not definitely seen on prior imaging of 06/16/2016 and 02/28/2016. Abdomen MRI without and with contrast recommended to further evaluate as metastatic disease is a concern. 2. Stable appearance of post treatment changes left upper lung and scattered tiny bilateral pulmonary nodules.    02/11/2017 Imaging    Ct neck: Lymph node mass right posterior neck appears improved from the prior study. Small posterior lymph nodes on the left unchanged. Occluded right jugular vein unchanged.    02/11/2017 Imaging    1. Similar appearance of postsurgical and radiation changes in the left upper lobe. 2. Similar bilateral pulmonary nodules. 3. No thoracic adenopathy. 4. Subtle foci of post-contrast enhancement within the liver are suboptimally characterized on this nondedicated study. Likely similar. These could either be re-evaluated at followup or more entirely characterized with abdominal MRI. 5. Left nephrolithiasis.    05/19/2017  Imaging    Matted lymph node mass right posterior neck appears larger in the recent CT. Accurate measurements difficult due to infiltrating tumor margins and infiltration of the muscle. Right jugular vein again appears occluded or resected. Small left posterior lymph nodes stable. Left upper lobe airspace density stable and similar to the prior CT    06/03/2017 PET scan    1. Hypermetabolic ill-defined right level IIb lymph node, about 1.3 cm in diameter with maximum SUV 9.5 (formerly 8.1). Appearance suspicious for residual/recurrent malignancy. No worrisome left-sided lesion. 2. Left suprahilar indistinct opacity demonstrates no worrisome hypermetabolic activity. The 5 mm left lower lobe pulmonary nodule is stable and not currently hypermetabolic although below sensitive PET-CT size thresholds. 3. Other imaging findings of potential clinical significance: Bilateral nonobstructive nephrolithiasis. Chronic bilateral maxillary  sinusitis.    05/11/2018 PET scan    1. Continued chronic accentuated metabolic activity in the vicinity of right level IIB and the adjacent right sternocleidomastoid muscle, with ill definition of surrounding tissue planes. Maximum SUV is currently 8.1, formerly 9.5. Accentuated metabolic activity is been present in this vicinity back through 06/25/2015, and there was also some low-level activity in this vicinity on 06/07/2014. Some of this may be from scarring and local muscular activity although clearly a component of residual tumor is difficult to exclude given the focally high activity. 2. Other imaging findings of potential clinical significance: Chronic bilateral maxillary sinusitis. Chronic scarring in the left upper lobe. Chronically stable 5 mm left lower lobe nodule is considered benign. Nonobstructive left nephrolithiasis.    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 COMMENT:No significant keratinization is identified. Other basaloid carcinomas are in the differential diagnosis. No cell block material is available for further testing.    09/12/2018 Procedure    He underwent fine Needle Aspiration    10/04/2018 PET scan    1. Significant progression of local recurrence laterally in the mid right neck with an enlarging, increasingly hypermetabolic soft tissue mass. This involves the right sternocleidomastoid muscle. 2. Small lymph nodes in the right axilla are increasingly hypermetabolic. These are nonspecific and potentially reactive, although could reflect a small metastases. Small hypermetabolic nodule in the left suprasternal notch is unchanged. 3. No other evidence of metastatic disease.     10/07/2018 -  Chemotherapy    The patient had cisplatin plus gemzar     REVIEW OF SYSTEMS:   Constitutional: Denies fevers, chills or abnormal  weight loss Eyes: Denies blurriness of vision Ears, nose, mouth, throat, and face: Denies mucositis or sore throat Respiratory: Denies cough, dyspnea or wheezes Cardiovascular: Denies palpitation, chest discomfort or lower extremity swelling Skin: Denies abnormal skin rashes Lymphatics: Denies new lymphadenopathy or easy bruising All other systems were reviewed with the patient and are negative.  I have reviewed the past medical history, past surgical history, social history and family history with the patient and they are unchanged from previous note.  ALLERGIES:  is allergic to phenergan [promethazine hcl]; heparin; and clindamycin.  MEDICATIONS:  Current Outpatient Medications  Medication Sig Dispense Refill  . cetirizine (ZYRTEC) 10 MG tablet Take 10 mg by mouth daily.    Marland Kitchen levothyroxine (SYNTHROID, LEVOTHROID) 137 MCG tablet TAKE 1 TABLET(137 MCG) BY MOUTH DAILY BEFORE BREAKFAST (Patient taking differently: Take 137 mcg by mouth daily before breakfast. ) 90 tablet 0  . LORazepam (ATIVAN) 1 MG tablet Take 1 tablet (  1 mg total) by mouth every 8 (eight) hours as needed for anxiety. 30 tablet 3  . mirtazapine (REMERON) 15 MG tablet Take 1 tablet (15 mg total) by mouth at bedtime. 30 tablet 1  . omeprazole (PRILOSEC) 20 MG capsule Take 1 capsule (20 mg total) by mouth daily. 30 capsule 5  . ondansetron (ZOFRAN) 8 MG tablet Take 1 tablet (8 mg total) by mouth every 8 (eight) hours as needed for nausea or vomiting. 90 tablet 1  . oxyCODONE (ROXICODONE) 15 MG immediate release tablet Take 1 tablet (15 mg total) by mouth every 4 (four) hours as needed for pain. 60 tablet 0  . PARoxetine (PAXIL) 20 MG tablet Take 1 tablet (20 mg total) by mouth daily. 30 tablet 5  . polyethylene glycol (MIRALAX) packet Take 17 g by mouth daily. (Patient taking differently: Take 17 g by mouth daily as needed for mild constipation or moderate constipation. ) 14 each 3  . prochlorperazine (COMPAZINE) 10 MG tablet  Take 1 tablet (10 mg total) by mouth every 6 (six) hours as needed (Nausea or vomiting). 30 tablet 1  . senna-docusate (SENOKOT-S) 8.6-50 MG tablet Take 2 tablets by mouth 3 (three) times daily. 90 tablet 0   No current facility-administered medications for this visit.     PHYSICAL EXAMINATION: ECOG PERFORMANCE STATUS: 1 - Symptomatic but completely ambulatory  Vitals:   11/24/18 1055  BP: (!) 123/57  Pulse: 87  Resp: 18  Temp: 98.4 F (36.9 C)  SpO2: 100%   Filed Weights   11/24/18 1055  Weight: 189 lb 12.8 oz (86.1 kg)    GENERAL:alert, no distress and comfortable SKIN: skin color, texture, turgor are normal, no rashes or significant lesions EYES: normal, Conjunctiva are pink and non-injected, sclera clear OROPHARYNX:no exudate, no erythema and lips, buccal mucosa, and tongue normal  NECK: The neck mass has almost completely resolved.  Well-healed surgical scar LYMPH:  no palpable lymphadenopathy in the cervical, axillary or inguinal LUNGS: clear to auscultation and percussion with normal breathing effort HEART: regular rate & rhythm and no murmurs and no lower extremity edema ABDOMEN:abdomen soft, non-tender and normal bowel sounds Musculoskeletal:no cyanosis of digits and no clubbing  NEURO: alert & oriented x 3 with fluent speech, no focal motor/sensory deficits  LABORATORY DATA:  I have reviewed the data as listed    Component Value Date/Time   NA 141 11/24/2018 1019   NA 139 09/22/2017 0829   K 4.2 11/24/2018 1019   K 3.5 09/22/2017 0829   CL 103 11/24/2018 1019   CO2 29 11/24/2018 1019   CO2 26 09/22/2017 0829   GLUCOSE 151 (H) 11/24/2018 1019   GLUCOSE 133 09/22/2017 0829   BUN 15 11/24/2018 1019   BUN 14.1 09/22/2017 0829   CREATININE 1.16 11/24/2018 1019   CREATININE 0.9 09/22/2017 0829   CALCIUM 9.4 11/24/2018 1019   CALCIUM 9.1 09/22/2017 0829   PROT 7.1 11/24/2018 1019   PROT 6.8 09/22/2017 0829   ALBUMIN 4.2 11/24/2018 1019   ALBUMIN 4.1  09/22/2017 0829   AST 20 11/24/2018 1019   AST 22 09/22/2017 0829   ALT 24 11/24/2018 1019   ALT 30 09/22/2017 0829   ALKPHOS 63 11/24/2018 1019   ALKPHOS 55 09/22/2017 0829   BILITOT 0.3 11/24/2018 1019   BILITOT 0.35 09/22/2017 0829   GFRNONAA >60 11/24/2018 1019   GFRAA >60 11/24/2018 1019    No results found for: SPEP, UPEP  Lab Results  Component Value Date  WBC 5.4 11/24/2018   NEUTROABS 2.7 11/24/2018   HGB 12.7 (L) 11/24/2018   HCT 38.8 (L) 11/24/2018   MCV 93.3 11/24/2018   PLT 228 11/24/2018      Chemistry      Component Value Date/Time   NA 141 11/24/2018 1019   NA 139 09/22/2017 0829   K 4.2 11/24/2018 1019   K 3.5 09/22/2017 0829   CL 103 11/24/2018 1019   CO2 29 11/24/2018 1019   CO2 26 09/22/2017 0829   BUN 15 11/24/2018 1019   BUN 14.1 09/22/2017 0829   CREATININE 1.16 11/24/2018 1019   CREATININE 0.9 09/22/2017 0829      Component Value Date/Time   CALCIUM 9.4 11/24/2018 1019   CALCIUM 9.1 09/22/2017 0829   ALKPHOS 63 11/24/2018 1019   ALKPHOS 55 09/22/2017 0829   AST 20 11/24/2018 1019   AST 22 09/22/2017 0829   ALT 24 11/24/2018 1019   ALT 30 09/22/2017 0829   BILITOT 0.3 11/24/2018 1019   BILITOT 0.35 09/22/2017 0829       RADIOGRAPHIC STUDIES: I have personally reviewed the radiological images as listed and agreed with the findings in the report. Dg Chest 2 View  Result Date: 10/30/2018 CLINICAL DATA:  Dyspnea, anxiety. EXAM: CHEST - 2 VIEW COMPARISON:  Most recent radiograph 10/09/2018. Head CT 10/04/2018 FINDINGS: Right chest port with tip in the mid SVC. Normal heart size and mediastinal contours. Right apical and left suprahilar scarring. No new airspace disease. Small left lower lobe nodule on prior PET is not seen radiographically. No pulmonary edema, pleural effusion, or pneumothorax. No acute osseous abnormalities are seen. IMPRESSION: No acute cardiopulmonary findings. Stable scarring. Electronically Signed   By: Keith Rake M.D.   On: 10/30/2018 20:00    All questions were answered. The patient knows to call the clinic with any problems, questions or concerns. No barriers to learning was detected.  I spent 25 minutes counseling the patient face to face. The total time spent in the appointment was 30 minutes and more than 50% was on counseling and review of test results  [name], MD 11/24/2018 11:22 AM

## 2018-11-24 NOTE — Assessment & Plan Note (Signed)
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

## 2018-11-24 NOTE — Assessment & Plan Note (Signed)
He has minimum pain.  He is tapering himself off oxycodone

## 2018-11-25 ENCOUNTER — Inpatient Hospital Stay: Payer: Medicaid Other

## 2018-11-25 ENCOUNTER — Other Ambulatory Visit: Payer: Self-pay

## 2018-11-25 VITALS — BP 126/74 | HR 95 | Temp 98.7°F | Resp 18

## 2018-11-25 DIAGNOSIS — Z5111 Encounter for antineoplastic chemotherapy: Secondary | ICD-10-CM | POA: Diagnosis not present

## 2018-11-25 DIAGNOSIS — C7800 Secondary malignant neoplasm of unspecified lung: Secondary | ICD-10-CM

## 2018-11-25 DIAGNOSIS — Z7189 Other specified counseling: Secondary | ICD-10-CM

## 2018-11-25 DIAGNOSIS — C760 Malignant neoplasm of head, face and neck: Secondary | ICD-10-CM

## 2018-11-25 DIAGNOSIS — Z95828 Presence of other vascular implants and grafts: Secondary | ICD-10-CM

## 2018-11-25 MED ORDER — POTASSIUM CHLORIDE 2 MEQ/ML IV SOLN
Freq: Once | INTRAVENOUS | Status: AC
  Administered 2018-11-25: 09:00:00 via INTRAVENOUS
  Filled 2018-11-25: qty 10

## 2018-11-25 MED ORDER — ANTICOAGULANT SODIUM CITRATE 4% (200MG/5ML) IV SOLN
5.0000 mL | Freq: Once | Status: AC
  Administered 2018-11-25: 5 mL
  Filled 2018-11-25: qty 5

## 2018-11-25 MED ORDER — PALONOSETRON HCL INJECTION 0.25 MG/5ML
INTRAVENOUS | Status: AC
  Filled 2018-11-25: qty 5

## 2018-11-25 MED ORDER — SODIUM CHLORIDE 0.9 % IV SOLN
Freq: Once | INTRAVENOUS | Status: AC
  Administered 2018-11-25: 09:00:00 via INTRAVENOUS
  Filled 2018-11-25: qty 250

## 2018-11-25 MED ORDER — SODIUM CHLORIDE 0.9 % IV SOLN
2000.0000 mg | Freq: Once | INTRAVENOUS | Status: AC
  Administered 2018-11-25: 2000 mg via INTRAVENOUS
  Filled 2018-11-25: qty 52.6

## 2018-11-25 MED ORDER — SODIUM CHLORIDE 0.9% FLUSH
10.0000 mL | INTRAVENOUS | Status: DC | PRN
  Administered 2018-11-25: 10 mL
  Filled 2018-11-25: qty 10

## 2018-11-25 MED ORDER — SODIUM CHLORIDE 0.9 % IV SOLN
40.0000 mg/m2 | Freq: Once | INTRAVENOUS | Status: AC
  Administered 2018-11-25: 81 mg via INTRAVENOUS
  Filled 2018-11-25: qty 81

## 2018-11-25 MED ORDER — SODIUM CHLORIDE 0.9 % IV SOLN
Freq: Once | INTRAVENOUS | Status: AC
  Administered 2018-11-25: 11:00:00 via INTRAVENOUS
  Filled 2018-11-25: qty 5

## 2018-11-25 MED ORDER — PALONOSETRON HCL INJECTION 0.25 MG/5ML
0.2500 mg | Freq: Once | INTRAVENOUS | Status: AC
  Administered 2018-11-25: 0.25 mg via INTRAVENOUS

## 2018-11-25 NOTE — Patient Instructions (Signed)
[facility] Discharge Instructions for Patients Receiving Chemotherapy  Today you received the following chemotherapy agents: Gemcitabine (Gemzar) and Cisplatin (Platinol)  To help prevent nausea and vomiting after your treatment, we encourage you to take your nausea medication as directed. Received Aloxi during treatment today-->Take your Compazine prescription (not Zofran) for the next 3 days as needed.    If you develop nausea and vomiting that is not controlled by your nausea medication, call the clinic.   BELOW ARE SYMPTOMS THAT SHOULD BE REPORTED IMMEDIATELY:  *FEVER GREATER THAN 100.5 F  *CHILLS WITH OR WITHOUT FEVER  NAUSEA AND VOMITING THAT IS NOT CONTROLLED WITH YOUR NAUSEA MEDICATION  *UNUSUAL SHORTNESS OF BREATH  *UNUSUAL BRUISING OR BLEEDING  TENDERNESS IN MOUTH AND THROAT WITH OR WITHOUT PRESENCE OF ULCERS  *URINARY PROBLEMS  *BOWEL PROBLEMS  UNUSUAL RASH Items with * indicate a potential emergency and should be followed up as soon as possible.  Feel free to call the clinic should you have any questions or concerns. The clinic phone number is [phone].  Please show the [facility] at check-in to the Emergency Department and triage nurse.

## 2018-11-25 NOTE — Progress Notes (Signed)
During pt's 2-hour post-hydration fluids he reported intense tinnitus on his way back from the bathroom. He stated he had felt it all day (about a 2 on a scale of 1-10) but it shot up to a 9 on his way back from the bathroom. Called [facility] with an update, and was instructed to reassure the pt that it was a side effect of the Cisplatin, and to push lots of fluids and avoid Aspirin. Advice relayed to pt who verbalized understanding and agreement. In-basket message sent to Dr. Alvy Bimler with update. Pt knows to return tomorrow morning for IVF.

## 2018-11-26 ENCOUNTER — Inpatient Hospital Stay: Payer: Medicaid Other

## 2018-11-26 ENCOUNTER — Telehealth: Payer: Self-pay | Admitting: *Deleted

## 2018-11-26 VITALS — BP 116/67 | HR 89 | Temp 98.3°F | Resp 16

## 2018-11-26 DIAGNOSIS — Z95828 Presence of other vascular implants and grafts: Secondary | ICD-10-CM

## 2018-11-26 DIAGNOSIS — C760 Malignant neoplasm of head, face and neck: Secondary | ICD-10-CM

## 2018-11-26 DIAGNOSIS — Z5111 Encounter for antineoplastic chemotherapy: Secondary | ICD-10-CM | POA: Diagnosis not present

## 2018-11-26 DIAGNOSIS — C7800 Secondary malignant neoplasm of unspecified lung: Secondary | ICD-10-CM

## 2018-11-26 MED ORDER — ONDANSETRON HCL 4 MG/2ML IJ SOLN: 8.0000 mg | Freq: Once | INTRAMUSCULAR | Status: DC

## 2018-11-26 MED ORDER — LORAZEPAM 2 MG/ML IJ SOLN
0.5000 mg | Freq: Once | INTRAMUSCULAR | Status: AC
  Administered 2018-11-26: 0.5 mg via INTRAVENOUS

## 2018-11-26 MED ORDER — LORAZEPAM 2 MG/ML IJ SOLN
INTRAMUSCULAR | Status: AC
  Filled 2018-11-26: qty 1

## 2018-11-26 MED ORDER — SODIUM CHLORIDE 0.9 % IV SOLN
Freq: Once | INTRAVENOUS | Status: AC
  Administered 2018-11-26: 10:00:00 via INTRAVENOUS
  Filled 2018-11-26: qty 250

## 2018-11-26 NOTE — Telephone Encounter (Signed)
Pt states low grade nausea post treatment yesterday with benefit with use of ativan per home meds pm last night.  He has not taken any ativan this AM.  Ativan 0.5 mg IV given per pt request with start of IVF.

## 2018-11-28 ENCOUNTER — Ambulatory Visit: Payer: Self-pay

## 2018-11-29 ENCOUNTER — Inpatient Hospital Stay: Payer: Medicaid Other

## 2018-11-29 ENCOUNTER — Ambulatory Visit (HOSPITAL_BASED_OUTPATIENT_CLINIC_OR_DEPARTMENT_OTHER): Payer: Medicaid Other | Admitting: Medical

## 2018-11-29 ENCOUNTER — Other Ambulatory Visit: Payer: Self-pay | Admitting: Medical

## 2018-11-29 VITALS — BP 129/72 | HR 90 | Temp 98.5°F | Resp 17

## 2018-11-29 DIAGNOSIS — Z5111 Encounter for antineoplastic chemotherapy: Secondary | ICD-10-CM | POA: Diagnosis not present

## 2018-11-29 DIAGNOSIS — Z95828 Presence of other vascular implants and grafts: Secondary | ICD-10-CM

## 2018-11-29 DIAGNOSIS — C7800 Secondary malignant neoplasm of unspecified lung: Secondary | ICD-10-CM

## 2018-11-29 DIAGNOSIS — J069 Acute upper respiratory infection, unspecified: Secondary | ICD-10-CM

## 2018-11-29 DIAGNOSIS — C760 Malignant neoplasm of head, face and neck: Secondary | ICD-10-CM

## 2018-11-29 DIAGNOSIS — Z7189 Other specified counseling: Secondary | ICD-10-CM

## 2018-11-29 DIAGNOSIS — C119 Malignant neoplasm of nasopharynx, unspecified: Secondary | ICD-10-CM | POA: Diagnosis not present

## 2018-11-29 MED ORDER — ANTICOAGULANT SODIUM CITRATE 4% (200MG/5ML) IV SOLN
5.0000 mL | Freq: Once | Status: AC
  Administered 2018-11-29: 5 mL via INTRAVENOUS
  Filled 2018-11-29: qty 5

## 2018-11-29 MED ORDER — ONDANSETRON HCL 4 MG/2ML IJ SOLN
INTRAMUSCULAR | Status: AC
  Filled 2018-11-29: qty 4

## 2018-11-29 MED ORDER — SODIUM CHLORIDE 0.9 % IV SOLN
Freq: Once | INTRAVENOUS | Status: AC
  Administered 2018-11-29: 10:00:00 via INTRAVENOUS
  Filled 2018-11-29: qty 250

## 2018-11-29 MED ORDER — AMOXICILLIN-POT CLAVULANATE 600-42.9 MG/5ML PO SUSR: 600.0000 mg | Freq: Two times a day (BID) | ORAL | 0 refills | Status: AC

## 2018-11-29 MED ORDER — MAGIC MOUTHWASH: 10.0000 mL | Freq: Four times a day (QID) | ORAL | 2 refills | Status: DC | PRN

## 2018-11-29 MED ORDER — SODIUM CHLORIDE 0.9% FLUSH
10.0000 mL | Freq: Once | INTRAVENOUS | Status: AC | PRN
  Administered 2018-11-29: 10 mL
  Filled 2018-11-29: qty 10

## 2018-11-29 MED ORDER — ONDANSETRON HCL 4 MG/2ML IJ SOLN
8.0000 mg | Freq: Once | INTRAMUSCULAR | Status: AC
  Administered 2018-11-29: 8 mg via INTRAVENOUS

## 2018-11-29 MED ORDER — LIDOCAINE VISCOUS HCL 2 % MT SOLN: 5.0000 mL | OROMUCOSAL | 2 refills | Status: DC | PRN

## 2018-11-29 MED ORDER — PROCHLORPERAZINE MALEATE 10 MG PO TABS: 10.0000 mg | ORAL_TABLET | Freq: Two times a day (BID) | ORAL | 1 refills | Status: DC | PRN

## 2018-11-29 NOTE — Progress Notes (Signed)
patient c/o of throat soreness since Sunday stating that he has not been able to eat of drink due to the soreness. He also c/o abdominal bloating. Sandi Mealy PA [facility] made aware and will assess patient while in infusion room.

## 2018-11-30 ENCOUNTER — Inpatient Hospital Stay: Payer: Medicaid Other

## 2018-12-01 ENCOUNTER — Telehealth: Payer: Self-pay

## 2018-12-01 ENCOUNTER — Inpatient Hospital Stay: Payer: Medicaid Other

## 2018-12-01 ENCOUNTER — Encounter: Payer: Self-pay | Admitting: Hematology and Oncology

## 2018-12-01 ENCOUNTER — Inpatient Hospital Stay (HOSPITAL_BASED_OUTPATIENT_CLINIC_OR_DEPARTMENT_OTHER): Payer: Medicaid Other | Admitting: Hematology and Oncology

## 2018-12-01 ENCOUNTER — Telehealth: Payer: Self-pay | Admitting: Hematology and Oncology

## 2018-12-01 VITALS — BP 127/84 | HR 85 | Temp 98.6°F | Resp 18 | Ht 69.0 in | Wt 191.6 lb

## 2018-12-01 DIAGNOSIS — R112 Nausea with vomiting, unspecified: Secondary | ICD-10-CM

## 2018-12-01 DIAGNOSIS — C119 Malignant neoplasm of nasopharynx, unspecified: Secondary | ICD-10-CM

## 2018-12-01 DIAGNOSIS — C760 Malignant neoplasm of head, face and neck: Secondary | ICD-10-CM

## 2018-12-01 DIAGNOSIS — G893 Neoplasm related pain (acute) (chronic): Secondary | ICD-10-CM | POA: Diagnosis not present

## 2018-12-01 DIAGNOSIS — R1013 Epigastric pain: Secondary | ICD-10-CM | POA: Diagnosis not present

## 2018-12-01 DIAGNOSIS — Z5111 Encounter for antineoplastic chemotherapy: Secondary | ICD-10-CM | POA: Diagnosis not present

## 2018-12-01 DIAGNOSIS — C7800 Secondary malignant neoplasm of unspecified lung: Secondary | ICD-10-CM

## 2018-12-01 LAB — COMPREHENSIVE METABOLIC PANEL
ALT: 31 U/L (ref 0–44)
AST: 21 U/L (ref 15–41)
Albumin: 4.4 g/dL (ref 3.5–5.0)
Alkaline Phosphatase: 63 U/L (ref 38–126)
Anion gap: 12 (ref 5–15)
BUN: 18 mg/dL (ref 6–20)
CALCIUM: 10.2 mg/dL (ref 8.9–10.3)
CO2: 28 mmol/L (ref 22–32)
Chloride: 100 mmol/L (ref 98–111)
Creatinine, Ser: 1.03 mg/dL (ref 0.61–1.24)
GFR calc Af Amer: >60 mL/min (ref >60)
GFR calc non Af Amer: >60 mL/min (ref >60)
Glucose, Bld: 126 mg/dL — ABNORMAL HIGH (ref 70–99)
Potassium: 4.1 mmol/L (ref 3.5–5.1)
Sodium: 140 mmol/L (ref 135–145)
Total Bilirubin: 0.3 mg/dL (ref 0.3–1.2)
Total Protein: 7.5 g/dL (ref 6.5–8.1)

## 2018-12-01 LAB — CBC WITH DIFFERENTIAL/PLATELET
Abs Immature Granulocytes: 0.09 10*3/uL — ABNORMAL HIGH (ref 0.00–0.07)
BASOS ABS: 0.1 10*3/uL (ref 0.0–0.1)
Basophils Relative: 1 %
Eosinophils Absolute: 0.1 10*3/uL (ref 0.0–0.5)
Eosinophils Relative: 1 %
HCT: 36.9 % — ABNORMAL LOW (ref 39.0–52.0)
Hemoglobin: 12.1 g/dL — ABNORMAL LOW (ref 13.0–17.0)
Immature Granulocytes: 2 %
Lymphocytes Relative: 32 %
Lymphs Abs: 1.6 10*3/uL (ref 0.7–4.0)
MCH: 30.2 pg (ref 26.0–34.0)
MCHC: 32.8 g/dL (ref 30.0–36.0)
MCV: 92 fL (ref 80.0–100.0)
MONO ABS: 0.2 10*3/uL (ref 0.1–1.0)
Monocytes Relative: 4 %
Neutro Abs: 3 10*3/uL (ref 1.7–7.7)
Neutrophils Relative %: 60 %
Platelets: 305 10*3/uL (ref 150–400)
RBC: 4.01 MIL/uL — AB (ref 4.22–5.81)
RDW: 15.3 % (ref 11.5–15.5)
WBC: 5 10*3/uL (ref 4.0–10.5)
nRBC: 0 % (ref 0.0–0.2)

## 2018-12-01 LAB — MAGNESIUM: Magnesium: 1.8 mg/dL (ref 1.7–2.4)

## 2018-12-01 MED ORDER — OXYCODONE HCL 5 MG PO TABS: 5.0000 mg | ORAL_TABLET | Freq: Three times a day (TID) | ORAL | 0 refills | Status: DC | PRN

## 2018-12-01 NOTE — Telephone Encounter (Signed)
Called to clarify today's appts. He wants to cancel infusion only. Canceled today's infusion appt.

## 2018-12-01 NOTE — Assessment & Plan Note (Signed)
He has fully recovered from side effects of treatment He tolerated reduced dose cisplatin well. I plan to repeat imaging next week  clinically, he had excellent response to therapy

## 2018-12-01 NOTE — Assessment & Plan Note (Signed)
He has less symptoms of nausea and vomiting since recent dose adjustment He does not need IV fluid support today.  He will continue antiemetics

## 2018-12-01 NOTE — Progress Notes (Signed)
[facility] Progress Note   Christian Simmons [id number] 01/31/1986 33 y.o.  Alexa Golebiewski is managed by Dr. [name]  Actively treated with chemotherapy/immunotherapy/hormonal therapy: yes  Current therapy: Gemcitabine  Last treated: 11/25/2018 (cycle 3, day 1)  Next scheduled appointment with provider: 12/01/2018  Assessment: Plan:    Sore throat  Upper respiratory tract infection, unspecified type   Sore throat in the setting of an upper respiratory tract infection: Patient was given a prescription for Magic mouthwash, viscous lidocaine, and Augmentin solution.   Please see After Visit Summary for patient specific instructions.  Future Appointments  Date Time Provider [place]  12/02/2018 11:00 AM [facility] INFUSION [facility] None  12/05/2018  9:00 AM [facility] INFUSION [facility] None  12/06/2018 11:00 AM [facility] RM 2 [facility] None  12/09/2018 10:45 AM Gorsuch, Ni, MD [facility] None    No orders of the defined types were placed in this encounter.      Subjective:   Patient ID:  Christian Simmons is a 33 y.o. (DOB 02/26/1986) male.  Chief Complaint: No chief complaint on file.   HPI Christian Simmons is a 33 year old male with a diagnosis of a metastatic carcinoma of the head neck who is managed by Dr. [name] and is status post cycle 3 of gemcitabine which was dosed on 11/25/2018.  The patient was seen in infusion today.  He reports that he is having a sore throat and difficulty swallowing due to pain in his throat.  His wife and child have been sick.  He denies fevers, chills, sweats, nausea, or vomiting.  He does request a refill of Compazine today.  Medications: I have reviewed the patient's current medications.  Allergies:  Allergies  Allergen Reactions  . Phenergan [Promethazine Hcl] Anxiety    Causes patients HR to go into the 140's with severe anxiety  . Heparin Other (See Comments)    No Pork derivatives due to  religion  . Clindamycin Rash    Past Medical History:  Diagnosis Date  . Arrhythmia 12/25/2015  . Carcinoma ([place])   . Fatigue 06/01/2014  . Hypothyroidism   . Infection of eyelash follicle of left eye 8/34/1962  . 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 Neuropathy   . Radiation 07/18/14-07/31/14   Left upper lobe  40 gy in 10 fractions  . 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 LUNG REMOVAL, PARTIAL  05/03/2012   left upper lobectomy  . nasal biopsy    . RADICAL NECK DISSECTION    . VIDEO BRONCHOSCOPY N/A 06/29/2014   Procedure: VIDEO BRONCHOSCOPY ;  Surgeon: Melrose Nakayama, MD;  Location: [facility] OR;  Service: Thoracic;  Laterality: N/A;    Family History  Problem Relation Age of Onset  . Hypertension Mother   . Diabetes Mother   . Hypertension Father   . Diabetes Father     Social History   Socioeconomic History  . Marital status: Married    Spouse name: Not on file  . Number of children: 1  . Years of education: Not on file  . Highest education level: Not on file  Occupational History  . Not on file  Social Needs  . Financial resource strain: Not on file  . Food insecurity:    Worry: Not on file    Inability: Not on file  . Transportation needs:    Medical: Not on  file    Non-medical: Not on file  Tobacco Use  . Smoking status: Never Smoker  . Smokeless tobacco: Never Used  Substance and Sexual Activity  . Alcohol use: No  . Drug use: No  . Sexual activity: Not on file  Lifestyle  . Physical activity:    Days per week: Not on file    Minutes per session: Not on file  . Stress: Not on file  Relationships  . Social connections:    Talks on phone: Not on file    Gets together: Not on file    Attends religious service: Not on file    Active member of club or organization: Not on file    Attends meetings of clubs or organizations: Not on file     Relationship status: Not on file  . Intimate partner violence:    Fear of current or ex partner: Not on file    Emotionally abused: Not on file    Physically abused: Not on file    Forced sexual activity: Not on file  Other Topics Concern  . Not on file  Social History Narrative   ** Merged History Encounter **        Past Medical History, Surgical history, Social history, and Family history were reviewed and updated as appropriate.   Please see review of systems for further details on the patient's review from today.   Review of Systems:  Review of Systems  Constitutional: Negative for chills, diaphoresis, fatigue and fever.  HENT: Positive for sore throat and trouble swallowing. Negative for congestion, postnasal drip and rhinorrhea.   Respiratory: Negative for cough, shortness of breath and wheezing.   Cardiovascular: Negative for palpitations.  Neurological: Negative for headaches.    Objective:   Physical Exam:  There were no vitals taken for this visit. ECOG: 1  Physical Exam Constitutional:      General: He is not in acute distress.    Appearance: He is not diaphoretic.  HENT:     Head: Normocephalic and atraumatic.     Mouth/Throat:     Pharynx: Posterior oropharyngeal erythema present. No oropharyngeal exudate.  Cardiovascular:     Rate and Rhythm: Normal rate and regular rhythm.     Heart sounds: Normal heart sounds. No murmur. No friction rub. No gallop.   Pulmonary:     Effort: Pulmonary effort is normal. No respiratory distress.     Breath sounds: Normal breath sounds. No stridor. No wheezing or rales.  Skin:    General: Skin is warm and dry.     Findings: No erythema or rash.  Neurological:     Mental Status: He is alert.     Lab Review:     Component Value Date/Time   NA 140 12/01/2018 1034   NA 139 09/22/2017 0829   K 4.1 12/01/2018 1034   K 3.5 09/22/2017 0829   CL 100 12/01/2018 1034   CO2 28 12/01/2018 1034   CO2 26 09/22/2017 0829    GLUCOSE 126 (H) 12/01/2018 1034   GLUCOSE 133 09/22/2017 0829   BUN 18 12/01/2018 1034   BUN 14.1 09/22/2017 0829   CREATININE 1.03 12/01/2018 1034   CREATININE 0.9 09/22/2017 0829   CALCIUM 10.2 12/01/2018 1034   CALCIUM 9.1 09/22/2017 0829   PROT 7.5 12/01/2018 1034   PROT 6.8 09/22/2017 0829   ALBUMIN 4.4 12/01/2018 1034   ALBUMIN 4.1 09/22/2017 0829   AST 21 12/01/2018 1034   AST 22 09/22/2017 0829  ALT 31 12/01/2018 1034   ALT 30 09/22/2017 0829   ALKPHOS 63 12/01/2018 1034   ALKPHOS 55 09/22/2017 0829   BILITOT 0.3 12/01/2018 1034   BILITOT 0.35 09/22/2017 0829   GFRNONAA >60 12/01/2018 1034   GFRAA >60 12/01/2018 1034       Component Value Date/Time   WBC 5.0 12/01/2018 1034   RBC 4.01 (L) 12/01/2018 1034   HGB 12.1 (L) 12/01/2018 1034   HGB 14.1 09/22/2017 0829   HCT 36.9 (L) 12/01/2018 1034   HCT 41.2 09/22/2017 0829   PLT 305 12/01/2018 1034   PLT 176 09/22/2017 0829   MCV 92.0 12/01/2018 1034   MCV 90.2 09/22/2017 0829   MCH 30.2 12/01/2018 1034   MCHC 32.8 12/01/2018 1034   RDW 15.3 12/01/2018 1034   RDW 13.5 09/22/2017 0829   LYMPHSABS 1.6 12/01/2018 1034   LYMPHSABS 1.4 09/22/2017 0829   MONOABS 0.2 12/01/2018 1034   MONOABS 0.6 09/22/2017 0829   EOSABS 0.1 12/01/2018 1034   EOSABS 0.1 09/22/2017 0829   BASOSABS 0.1 12/01/2018 1034   BASOSABS 0.0 09/22/2017 0829   -------------------------------  Imaging from last 24 hours (if applicable):  Radiology interpretation: No results found.

## 2018-12-01 NOTE — Assessment & Plan Note (Signed)
He has less pain. He is currently on a taper course of oxycodone.  I refilled his prescription today

## 2018-12-01 NOTE — Assessment & Plan Note (Signed)
He had recent mucositis and esophagitis This could be related to stress and side effects of steroid I recommend omeprazole and Tums as needed

## 2018-12-01 NOTE — Telephone Encounter (Signed)
Gave avs and calendar ° °

## 2018-12-01 NOTE — Progress Notes (Signed)
[place] OFFICE PROGRESS NOTE  Patient Care Team: Patient, No Pcp Per as PCP - General (General Practice) [name], MD as Consulting Physician (Hematology and Oncology) Eppie Gibson, MD as Attending Physician (Radiation Oncology) Jodi Marble, MD as Consulting Physician (Otolaryngology) Philomena Doheny, MD as Referring Physician (Plastic Surgery) Irene Shipper, MD as Consulting Physician (Gastroenterology) System, Provider Not In  ASSESSMENT & PLAN:  Primary cancer of head and neck [facility]) He has fully recovered from side effects of treatment He tolerated reduced dose cisplatin well. I plan to repeat imaging next week  clinically, he had excellent response to therapy  Cancer associated pain He has less pain. He is currently on a taper course of oxycodone.  I refilled his prescription today  Epigastric pain He had recent mucositis and esophagitis This could be related to stress and side effects of steroid I recommend omeprazole and Tums as needed  Nausea and vomiting He has less symptoms of nausea and vomiting since recent dose adjustment He does not need IV fluid support today.  He will continue antiemetics   Orders Placed This Encounter  Procedures  . CT Soft Tissue Neck W Contrast    Standing Status:   Future    Standing Expiration Date:   12/01/2019    Order Specific Question:   If indicated for the ordered procedure, I authorize the administration of contrast media per Radiology protocol    Answer:   Yes    Order Specific Question:   Preferred imaging location?    Answer:   [facility]    Order Specific Question:   Radiology Contrast Protocol - do NOT remove file path    Answer:   [email or web address]    INTERVAL HISTORY: Please see below for problem oriented charting. He returns to be seen prior to cycle 3, day 8 of treatment With recent dose adjustment of cisplatin, he had less nausea or vomiting His neck  pain is stable and he is attempting to wean himself off pain medicine He complained of some esophagitis and difficulties with sleeping He is is able to maintain his weight Continues to have ringing in his ears.  Denies peripheral neuropathy  SUMMARY OF ONCOLOGIC HISTORY: Oncology History   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 Pan-endoscopy elsewhere showed cancer from nasopharyngeal space.    01/04/2007 - 02/20/2007 Chemotherapy    He received 2 cycles of cisplatin and 5FU followed by concurrent chemo with weekly cisplatin and radiation. He only received 2 doses of chemo due to severe mucositis, nausea and weight loss.    04/05/2007 - 08/04/2007 Chemotherapy    He received 4 more courses of cisplatin with 5FU and had complete response    07/05/2009 Procedure    Fine-needle aspirate of the right level II lymph nodes come from recurrent metastatic disease. Repeat endoscopy and CT scan show no evidence of disease elsewhere.    07/08/2009 - 12/02/2009 Chemotherapy    He was given 6 cycles of carboplatin, 5-FU  and docetaxel    12/03/2009 Surgery    He has surgery to the residual lymph node on the right neck which showed no evidence of disease.    02/22/2012 Imaging    Repeat imaging study showed large recurrent mass. He was referred elsewhere for further treatment.    05/03/2012 Surgery    He underwent left upper lobectomy.    04/29/2013 Imaging    PEt scan showed lesion on right level II B and lower lung was abnormal    06/03/2013 - 02/02/2014 Chemotherapy    He had  6 cycles of chemotherapy when he was found to have recurrence of cancer and had received oxaliplatin and capecitabine    06/07/2014 Imaging    PET CT scan showed persistent disease in the right neck lymph nodes and left lung    06/29/2014 Procedure    Accession: [id number] repeat LUL biopsy confirmed metastatic cancer    07/18/2014 - 07/31/2014 Radiation Therapy    He received palliative radiation therapy to the lungs    10/10/2014 Imaging    CT scan of the chest, abdomen and pelvis show regression in the size of the lung nodule in the left upper lobe and stable pulmonary nodules    01/24/2015 Imaging    CT scan showed stable disease in neck and lung    06/19/2015 Imaging    CT scan of the neck and the chest show possible mild progression of the nodule in the right side of the neck.    06/25/2015 Imaging    PET scan confirmed disease recurrence in the neck    07/07/2015 Imaging    He had MRI neck at [facility]    09/03/2015 - 08/26/2018 Chemotherapy    He received palliative chemo with Nivolumab    10/29/2015 Imaging    PET CT showed positive response to Rx    02/28/2016 Imaging    Ct abdomen showed abnormal thinkening in his stomach    03/03/2016 Imaging    CT: Right sternocleidomastoid muscle metastasis appears less distinct but otherwise not significantly changed in size or configuration since 06/19/2015.2. Left level 3 lymph node which was hypermetabolic by PET-CT in January 2017 appears slightly smaller    04/01/2016 Imaging    CT cervical spine showed no acute fracture or traumatic malalignment in the cervical spine    04/22/2016 Procedure    Port-a-cath placed.    06/16/2016 Imaging    Ct neck showed right sternocleidomastoid muscle metastasis is further decreased in conspicuity since May, and has mildly decreased in size since September 2016. Continued stability of sub-centimeter left cervical lymph nodes. No new or progressive metastatic disease in the neck.    06/16/2016 Imaging     CT chest showed stable masslike radiation fibrosis in the left upper lobe. Stable subcentimeter pulmonary nodules in the bilateral lower lobes. No new or progressive metastatic disease in the chest. Nonobstructing left renal stone.    10/13/2016 Imaging    Ct neck showed unchanged right sternocleidomastoid muscle metastasis. Unchanged subcentimeter left cervical lymph nodes. No evidence of new or progressive metastatic disease in the neck.    10/13/2016 Imaging    CT chest showed tiny hypervascular foci in the liver, not definitely seen on prior imaging of 06/16/2016 and 02/28/2016. Abdomen MRI without and with contrast recommended to further evaluate as metastatic disease is a concern. 2. Stable appearance of post treatment changes left upper lung and scattered tiny bilateral pulmonary nodules.    02/11/2017 Imaging    Ct  neck: Lymph node mass right posterior neck appears improved from the prior study. Small posterior lymph nodes on the left unchanged. Occluded right jugular vein unchanged.    02/11/2017 Imaging    1. Similar appearance of postsurgical and radiation changes in the left upper lobe. 2. Similar bilateral pulmonary nodules. 3. No thoracic adenopathy. 4. Subtle foci of post-contrast enhancement within the liver are suboptimally characterized on this nondedicated study. Likely similar. These could either be re-evaluated at followup or more entirely characterized with abdominal MRI. 5. Left nephrolithiasis.    05/19/2017 Imaging    Matted lymph node mass right posterior neck appears larger in the recent CT. Accurate measurements difficult due to infiltrating tumor margins and infiltration of the muscle. Right jugular vein again appears occluded or resected. Small left posterior lymph nodes stable. Left upper lobe airspace density stable and similar to the prior CT    06/03/2017 PET scan    1. Hypermetabolic ill-defined right level IIb lymph node, about 1.3 cm in diameter with maximum SUV  9.5 (formerly 8.1). Appearance suspicious for residual/recurrent malignancy. No worrisome left-sided lesion. 2. Left suprahilar indistinct opacity demonstrates no worrisome hypermetabolic activity. The 5 mm left lower lobe pulmonary nodule is stable and not currently hypermetabolic although below sensitive PET-CT size thresholds. 3. Other imaging findings of potential clinical significance: Bilateral nonobstructive nephrolithiasis. Chronic bilateral maxillary sinusitis.    05/11/2018 PET scan    1. Continued chronic accentuated metabolic activity in the vicinity of right level IIB and the adjacent right sternocleidomastoid muscle, with ill definition of surrounding tissue planes. Maximum SUV is currently 8.1, formerly 9.5. Accentuated metabolic activity is been present in this vicinity back through 06/25/2015, and there was also some low-level activity in this vicinity on 06/07/2014. Some of this may be from scarring and local muscular activity although clearly a component of residual tumor is difficult to exclude given the focally high activity. 2. Other imaging findings of potential clinical significance: Chronic bilateral maxillary sinusitis. Chronic scarring in the left upper lobe. Chronically stable 5 mm left lower lobe nodule is considered benign. Nonobstructive left nephrolithiasis.    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 COMMENT:No significant keratinization is identified. Other basaloid carcinomas are in the differential diagnosis. No cell block material is available for further testing.    09/12/2018 Procedure    He underwent fine Needle Aspiration    10/04/2018 PET scan    1. Significant progression of local recurrence laterally in the mid right neck with an enlarging, increasingly hypermetabolic soft tissue mass. This involves the right  sternocleidomastoid muscle. 2. Small lymph nodes in the right axilla are increasingly hypermetabolic. These are nonspecific and potentially reactive, although could reflect a small metastases. Small hypermetabolic nodule in the left suprasternal notch is unchanged. 3. No other evidence of metastatic disease.     10/07/2018 -  Chemotherapy    The patient had cisplatin plus gemzar     REVIEW OF SYSTEMS:   Constitutional: Denies fevers, chills or abnormal weight loss Eyes: Denies blurriness of vision Ears, nose, mouth, throat, and face: Denies mucositis or sore throat Respiratory: Denies cough, dyspnea or wheezes Cardiovascular: Denies palpitation, chest discomfort or lower extremity swelling  Skin: Denies abnormal skin rashes Lymphatics: Denies new lymphadenopathy or easy bruising Neurological:Denies numbness, tingling or new weaknesses Behavioral/Psych: Mood is stable, no new changes  All other systems were reviewed with the  patient and are negative.  I have reviewed the past medical history, past surgical history, social history and family history with the patient and they are unchanged from previous note.  ALLERGIES:  is allergic to phenergan [promethazine hcl]; heparin; and clindamycin.  MEDICATIONS:  Current Outpatient Medications  Medication Sig Dispense Refill  . amoxicillin-clavulanate (AUGMENTIN ES-600) 600-42.9 MG/5ML suspension Take 5 mLs (600 mg total) by mouth 2 (two) times daily for 7 days. 200 mL 0  . cetirizine (ZYRTEC) 10 MG tablet Take 10 mg by mouth daily.    Marland Kitchen levothyroxine (SYNTHROID, LEVOTHROID) 137 MCG tablet TAKE 1 TABLET(137 MCG) BY MOUTH DAILY BEFORE BREAKFAST (Patient taking differently: Take 137 mcg by mouth daily before breakfast. ) 90 tablet 0  . lidocaine (XYLOCAINE) 2 % solution Use as directed 5 mLs in the mouth or throat every 3 (three) hours as needed for mouth pain. Swish, gargle and spit 100 mL 2  . LORazepam (ATIVAN) 1 MG tablet Take 1 tablet (1 mg  total) by mouth every 8 (eight) hours as needed for anxiety. 30 tablet 3  . magic mouthwash SOLN Take 10 mLs by mouth 4 (four) times daily as needed for mouth pain. Swish and swallow or spit 240 mL 2  . mirtazapine (REMERON) 15 MG tablet Take 1 tablet (15 mg total) by mouth at bedtime. 30 tablet 1  . omeprazole (PRILOSEC) 20 MG capsule Take 1 capsule (20 mg total) by mouth daily. 30 capsule 5  . ondansetron (ZOFRAN) 8 MG tablet Take 1 tablet (8 mg total) by mouth every 8 (eight) hours as needed for nausea or vomiting. 90 tablet 1  . oxyCODONE (OXY IR/ROXICODONE) 5 MG immediate release tablet Take 1 tablet (5 mg total) by mouth every 8 (eight) hours as needed for pain. 30 tablet 0  . PARoxetine (PAXIL) 20 MG tablet Take 1 tablet (20 mg total) by mouth daily. 30 tablet 5  . polyethylene glycol (MIRALAX) packet Take 17 g by mouth daily. (Patient taking differently: Take 17 g by mouth daily as needed for mild constipation or moderate constipation. ) 14 each 3  . prochlorperazine (COMPAZINE) 10 MG tablet Take 1 tablet (10 mg total) by mouth every 12 (twelve) hours as needed (Nausea or vomiting). 30 tablet 1  . senna-docusate (SENOKOT-S) 8.6-50 MG tablet Take 2 tablets by mouth 3 (three) times daily. 90 tablet 0   No current facility-administered medications for this visit.     PHYSICAL EXAMINATION: ECOG PERFORMANCE STATUS: 1 - Symptomatic but completely ambulatory  Vitals:   12/01/18 1055  BP: 127/84  Pulse: 85  Resp: 18  Temp: 98.6 F (37 C)  SpO2: 100%   Filed Weights   12/01/18 1055  Weight: 191 lb 9.6 oz (86.9 kg)    GENERAL:alert, no distress and comfortable SKIN: skin color, texture, turgor are normal, no rashes or significant lesions EYES: normal, Conjunctiva are pink and non-injected, sclera clear OROPHARYNX:no exudate, no erythema and lips, buccal mucosa, and tongue normal  NECK: The neck lesion has resolved. LYMPH:  no palpable lymphadenopathy in the cervical, axillary or  inguinal LUNGS: clear to auscultation and percussion with normal breathing effort HEART: regular rate & rhythm and no murmurs and no lower extremity edema ABDOMEN:abdomen soft, non-tender and normal bowel sounds Musculoskeletal:no cyanosis of digits and no clubbing  NEURO: alert & oriented x 3 with fluent speech, no focal motor/sensory deficits  LABORATORY DATA:  I have reviewed the data as listed    Component Value Date/Time  NA 140 12/01/2018 1034   NA 139 09/22/2017 0829   K 4.1 12/01/2018 1034   K 3.5 09/22/2017 0829   CL 100 12/01/2018 1034   CO2 28 12/01/2018 1034   CO2 26 09/22/2017 0829   GLUCOSE 126 (H) 12/01/2018 1034   GLUCOSE 133 09/22/2017 0829   BUN 18 12/01/2018 1034   BUN 14.1 09/22/2017 0829   CREATININE 1.03 12/01/2018 1034   CREATININE 0.9 09/22/2017 0829   CALCIUM 10.2 12/01/2018 1034   CALCIUM 9.1 09/22/2017 0829   PROT 7.5 12/01/2018 1034   PROT 6.8 09/22/2017 0829   ALBUMIN 4.4 12/01/2018 1034   ALBUMIN 4.1 09/22/2017 0829   AST 21 12/01/2018 1034   AST 22 09/22/2017 0829   ALT 31 12/01/2018 1034   ALT 30 09/22/2017 0829   ALKPHOS 63 12/01/2018 1034   ALKPHOS 55 09/22/2017 0829   BILITOT 0.3 12/01/2018 1034   BILITOT 0.35 09/22/2017 0829   GFRNONAA >60 12/01/2018 1034   GFRAA >60 12/01/2018 1034    No results found for: SPEP, UPEP  Lab Results  Component Value Date   WBC 5.0 12/01/2018   NEUTROABS 3.0 12/01/2018   HGB 12.1 (L) 12/01/2018   HCT 36.9 (L) 12/01/2018   MCV 92.0 12/01/2018   PLT 305 12/01/2018      Chemistry      Component Value Date/Time   NA 140 12/01/2018 1034   NA 139 09/22/2017 0829   K 4.1 12/01/2018 1034   K 3.5 09/22/2017 0829   CL 100 12/01/2018 1034   CO2 28 12/01/2018 1034   CO2 26 09/22/2017 0829   BUN 18 12/01/2018 1034   BUN 14.1 09/22/2017 0829   CREATININE 1.03 12/01/2018 1034   CREATININE 0.9 09/22/2017 0829      Component Value Date/Time   CALCIUM 10.2 12/01/2018 1034   CALCIUM 9.1 09/22/2017  0829   ALKPHOS 63 12/01/2018 1034   ALKPHOS 55 09/22/2017 0829   AST 21 12/01/2018 1034   AST 22 09/22/2017 0829   ALT 31 12/01/2018 1034   ALT 30 09/22/2017 0829   BILITOT 0.3 12/01/2018 1034   BILITOT 0.35 09/22/2017 0829       All questions were answered. The patient knows to call the clinic with any problems, questions or concerns. No barriers to learning was detected.  I spent 25 minutes counseling the patient face to face. The total time spent in the appointment was 30 minutes and more than 50% was on counseling and review of test results  [name], MD 12/01/2018 12:46 PM

## 2018-12-02 ENCOUNTER — Inpatient Hospital Stay: Payer: Medicaid Other

## 2018-12-02 VITALS — BP 128/69 | HR 80 | Temp 97.7°F | Resp 16

## 2018-12-02 DIAGNOSIS — Z5111 Encounter for antineoplastic chemotherapy: Secondary | ICD-10-CM | POA: Diagnosis not present

## 2018-12-02 DIAGNOSIS — Z7189 Other specified counseling: Secondary | ICD-10-CM

## 2018-12-02 DIAGNOSIS — C760 Malignant neoplasm of head, face and neck: Secondary | ICD-10-CM

## 2018-12-02 MED ORDER — ONDANSETRON HCL 4 MG/2ML IJ SOLN
INTRAMUSCULAR | Status: AC
  Filled 2018-12-02: qty 4

## 2018-12-02 MED ORDER — SODIUM CHLORIDE 0.9% FLUSH
10.0000 mL | INTRAVENOUS | Status: DC | PRN
  Administered 2018-12-02: 10 mL
  Filled 2018-12-02: qty 10

## 2018-12-02 MED ORDER — ONDANSETRON HCL 8 MG PO TABS: 8.0000 mg | ORAL_TABLET | Freq: Once | ORAL | Status: DC

## 2018-12-02 MED ORDER — SODIUM CHLORIDE 0.9 % IV SOLN
2000.0000 mg | Freq: Once | INTRAVENOUS | Status: AC
  Administered 2018-12-02: 2000 mg via INTRAVENOUS
  Filled 2018-12-02: qty 52.6

## 2018-12-02 MED ORDER — ONDANSETRON HCL 8 MG PO TABS
ORAL_TABLET | ORAL | Status: AC
  Filled 2018-12-02: qty 1

## 2018-12-02 MED ORDER — ONDANSETRON HCL 4 MG/2ML IJ SOLN
8.0000 mg | Freq: Once | INTRAMUSCULAR | Status: AC
  Administered 2018-12-02: 8 mg via INTRAVENOUS

## 2018-12-02 MED ORDER — ANTICOAGULANT SODIUM CITRATE 4% (200MG/5ML) IV SOLN
5.0000 mL | Freq: Once | Status: AC
  Administered 2018-12-02: 5 mL via INTRAVENOUS
  Filled 2018-12-02: qty 5

## 2018-12-02 MED ORDER — SODIUM CHLORIDE 0.9% FLUSH
3.0000 mL | INTRAVENOUS | Status: DC | PRN
  Filled 2018-12-02: qty 10

## 2018-12-02 MED ORDER — SODIUM CHLORIDE 0.9 % IV SOLN
Freq: Once | INTRAVENOUS | Status: AC
  Administered 2018-12-02: 11:00:00 via INTRAVENOUS
  Filled 2018-12-02: qty 250

## 2018-12-02 NOTE — Patient Instructions (Signed)
[facility] Discharge Instructions for Patients Receiving Chemotherapy  Today you received the following chemotherapy agents Gemzar To help prevent nausea and vomiting after your treatment, we encourage you to take your nausea medication as prescribed.  If you develop nausea and vomiting that is not controlled by your nausea medication, call the clinic.   BELOW ARE SYMPTOMS THAT SHOULD BE REPORTED IMMEDIATELY:  *FEVER GREATER THAN 100.5 F  *CHILLS WITH OR WITHOUT FEVER  NAUSEA AND VOMITING THAT IS NOT CONTROLLED WITH YOUR NAUSEA MEDICATION  *UNUSUAL SHORTNESS OF BREATH  *UNUSUAL BRUISING OR BLEEDING  TENDERNESS IN MOUTH AND THROAT WITH OR WITHOUT PRESENCE OF ULCERS  *URINARY PROBLEMS  *BOWEL PROBLEMS  UNUSUAL RASH Items with * indicate a potential emergency and should be followed up as soon as possible.  Feel free to call the clinic should you have any questions or concerns. The clinic phone number is [phone].  Please show the CHEMO ALERT CARD at check-in to the Emergency Department and triage nurse.   

## 2018-12-05 ENCOUNTER — Inpatient Hospital Stay: Payer: Medicaid Other | Attending: Hematology and Oncology

## 2018-12-05 DIAGNOSIS — R112 Nausea with vomiting, unspecified: Secondary | ICD-10-CM | POA: Insufficient documentation

## 2018-12-05 DIAGNOSIS — G893 Neoplasm related pain (acute) (chronic): Secondary | ICD-10-CM | POA: Insufficient documentation

## 2018-12-05 DIAGNOSIS — C119 Malignant neoplasm of nasopharynx, unspecified: Secondary | ICD-10-CM | POA: Insufficient documentation

## 2018-12-05 DIAGNOSIS — Z5111 Encounter for antineoplastic chemotherapy: Secondary | ICD-10-CM | POA: Insufficient documentation

## 2018-12-06 ENCOUNTER — Encounter (HOSPITAL_COMMUNITY): Payer: Self-pay

## 2018-12-06 ENCOUNTER — Ambulatory Visit (HOSPITAL_COMMUNITY)
Admission: RE | Admit: 2018-12-06 | Discharge: 2018-12-06 | Disposition: A | Discharge status: Home / Self Care | Payer: Medicaid Other | Source: Ambulatory Visit | Attending: Hematology and Oncology | Admitting: Hematology and Oncology

## 2018-12-06 DIAGNOSIS — C760 Malignant neoplasm of head, face and neck: Secondary | ICD-10-CM | POA: Diagnosis not present

## 2018-12-06 MED ORDER — SODIUM CHLORIDE (PF) 0.9 % IJ SOLN
INTRAMUSCULAR | Status: AC
  Filled 2018-12-06: qty 50

## 2018-12-06 MED ORDER — IOHEXOL 300 MG/ML  SOLN
75.0000 mL | Freq: Once | INTRAMUSCULAR | Status: AC | PRN
  Administered 2018-12-06: 75 mL via INTRAVENOUS

## 2018-12-09 ENCOUNTER — Inpatient Hospital Stay (HOSPITAL_BASED_OUTPATIENT_CLINIC_OR_DEPARTMENT_OTHER): Payer: Medicaid Other | Admitting: Hematology and Oncology

## 2018-12-09 ENCOUNTER — Telehealth: Payer: Self-pay | Admitting: Hematology and Oncology

## 2018-12-09 ENCOUNTER — Encounter: Payer: Self-pay | Admitting: Hematology and Oncology

## 2018-12-09 DIAGNOSIS — C760 Malignant neoplasm of head, face and neck: Secondary | ICD-10-CM

## 2018-12-09 DIAGNOSIS — Z5111 Encounter for antineoplastic chemotherapy: Secondary | ICD-10-CM | POA: Diagnosis present

## 2018-12-09 DIAGNOSIS — C119 Malignant neoplasm of nasopharynx, unspecified: Secondary | ICD-10-CM

## 2018-12-09 DIAGNOSIS — G893 Neoplasm related pain (acute) (chronic): Secondary | ICD-10-CM | POA: Diagnosis not present

## 2018-12-09 DIAGNOSIS — R112 Nausea with vomiting, unspecified: Secondary | ICD-10-CM | POA: Diagnosis not present

## 2018-12-09 NOTE — Progress Notes (Signed)
[facility] OFFICE PROGRESS NOTE  Patient Care Team: Patient, No Pcp Per as PCP - General (General Practice) [name], MD as Consulting Physician (Hematology and Oncology) Eppie Gibson, MD as Attending Physician (Radiation Oncology) Jodi Marble, MD as Consulting Physician (Otolaryngology) Philomena Doheny, MD as Referring Physician (Plastic Surgery) Irene Shipper, MD as Consulting Physician (Gastroenterology) System, Provider Not In  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Christian Simmons) I have reviewed CT imaging with the patient He had excellent response to treatment I recommend we complete 3 more cycles of treatment with 50% dose reduction of cisplatin I have scheduled him to resume treatment next week and I will see him back on day 8 of treatment.  He agreed with the plan of care  Cancer associated pain He is doing well with oxycodone taper.  He will continue the same  Nausea and vomiting He has less symptoms of nausea and vomiting with 50% dose reduction of cisplatin He felt that he only needed 1 day of IV fluids the day after chemotherapy and I will try to schedule that for him.  He will continue antiemetics   No orders of the defined types were placed in this encounter.   INTERVAL HISTORY: Please see below for problem oriented charting. He returns for further follow-up and review test results He tolerated last cycle of treatment well Denies significant nausea or vomiting His neck pain is stable.  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 Pan-endoscopy elsewhere showed cancer from nasopharyngeal space.    01/04/2007 - 02/20/2007 Chemotherapy    He received 2 cycles of cisplatin and 5FU followed by concurrent chemo with weekly cisplatin and radiation. He only received 2 doses of chemo due to severe mucositis, nausea and weight loss.    04/05/2007 - 08/04/2007 Chemotherapy    He received 4 more courses of cisplatin with 5FU and had complete response    07/05/2009 Procedure    Fine-needle aspirate of the right level II lymph nodes come from recurrent metastatic disease. Repeat endoscopy and CT scan show no evidence of disease elsewhere.    07/08/2009 - 12/02/2009 Chemotherapy    He was given 6 cycles of carboplatin, 5-FU and docetaxel    12/03/2009 Surgery    He has surgery to the residual lymph node on the right neck which showed no evidence of disease.    02/22/2012 Imaging    Repeat imaging study showed large recurrent mass. He was referred elsewhere for further treatment.    05/03/2012 Surgery    He underwent left upper lobectomy.    04/29/2013 Imaging    PEt scan showed lesion on right level II B and lower lung was abnormal    06/03/2013 - 02/02/2014 Chemotherapy  He had 6 cycles of chemotherapy when he was found to have recurrence of cancer and had received oxaliplatin and capecitabine    06/07/2014 Imaging    PET CT scan showed persistent disease in the right neck lymph nodes and left lung    06/29/2014 Procedure    Accession: [id number] repeat LUL biopsy confirmed metastatic cancer    07/18/2014 - 07/31/2014 Radiation Therapy    He received palliative radiation therapy to the lungs    10/10/2014 Imaging    CT scan of the chest, abdomen and pelvis show  regression in the size of the lung nodule in the left upper lobe and stable pulmonary nodules    01/24/2015 Imaging    CT scan showed stable disease in neck and lung    06/19/2015 Imaging    CT scan of the neck and the chest show possible mild progression of the nodule in the right side of the neck.    06/25/2015 Imaging    PET scan confirmed disease recurrence in the neck    07/07/2015 Imaging    He had MRI neck at [facility]    09/03/2015 - 08/26/2018 Chemotherapy    He received palliative chemo with Nivolumab    10/29/2015 Imaging    PET CT showed positive response to Rx    02/28/2016 Imaging    Ct abdomen showed abnormal thinkening in his stomach    03/03/2016 Imaging    CT: Right sternocleidomastoid muscle metastasis appears less distinct but otherwise not significantly changed in size or configuration since 06/19/2015.2. Left level 3 lymph node which was hypermetabolic by PET-CT in January 2017 appears slightly smaller    04/01/2016 Imaging    CT cervical spine showed no acute fracture or traumatic malalignment in the cervical spine    04/22/2016 Procedure    Port-a-cath placed.    06/16/2016 Imaging    Ct neck showed right sternocleidomastoid muscle metastasis is further decreased in conspicuity since May, and has mildly decreased in size since September 2016. Continued stability of sub-centimeter left cervical lymph nodes. No new or progressive metastatic disease in the neck.    06/16/2016 Imaging    CT chest showed stable masslike radiation fibrosis in the left upper lobe. Stable subcentimeter pulmonary nodules in the bilateral lower lobes. No new or progressive metastatic disease in the chest. Nonobstructing left renal stone.    10/13/2016 Imaging    Ct neck showed unchanged right sternocleidomastoid muscle metastasis. Unchanged subcentimeter left cervical lymph nodes. No evidence of new or progressive metastatic disease in the neck.    10/13/2016 Imaging    CT chest showed tiny  hypervascular foci in the liver, not definitely seen on prior imaging of 06/16/2016 and 02/28/2016. Abdomen MRI without and with contrast recommended to further evaluate as metastatic disease is a concern. 2. Stable appearance of post treatment changes left upper lung and scattered tiny bilateral pulmonary nodules.    02/11/2017 Imaging    Ct neck: Lymph node mass right posterior neck appears improved from the prior study. Small posterior lymph nodes on the left unchanged. Occluded right jugular vein unchanged.    02/11/2017 Imaging    1. Similar appearance of postsurgical and radiation changes in the left upper lobe. 2. Similar bilateral pulmonary nodules. 3. No thoracic adenopathy. 4. Subtle foci of post-contrast enhancement within the liver are suboptimally characterized on this nondedicated study. Likely similar. These could either be re-evaluated at followup or more entirely characterized with abdominal MRI. 5. Left nephrolithiasis.    05/19/2017  Imaging    Matted lymph node mass right posterior neck appears larger in the recent CT. Accurate measurements difficult due to infiltrating tumor margins and infiltration of the muscle. Right jugular vein again appears occluded or resected. Small left posterior lymph nodes stable. Left upper lobe airspace density stable and similar to the prior CT    06/03/2017 PET scan    1. Hypermetabolic ill-defined right level IIb lymph node, about 1.3 cm in diameter with maximum SUV 9.5 (formerly 8.1). Appearance suspicious for residual/recurrent malignancy. No worrisome left-sided lesion. 2. Left suprahilar indistinct opacity demonstrates no worrisome hypermetabolic activity. The 5 mm left lower lobe pulmonary nodule is stable and not currently hypermetabolic although below sensitive PET-CT size thresholds. 3. Other imaging findings of potential clinical significance: Bilateral nonobstructive nephrolithiasis. Chronic bilateral maxillary sinusitis.    05/11/2018  PET scan    1. Continued chronic accentuated metabolic activity in the vicinity of right level IIB and the adjacent right sternocleidomastoid muscle, with ill definition of surrounding tissue planes. Maximum SUV is currently 8.1, formerly 9.5. Accentuated metabolic activity is been present in this vicinity back through 06/25/2015, and there was also some low-level activity in this vicinity on 06/07/2014. Some of this may be from scarring and local muscular activity although clearly a component of residual tumor is difficult to exclude given the focally high activity. 2. Other imaging findings of potential clinical significance: Chronic bilateral maxillary sinusitis. Chronic scarring in the left upper lobe. Chronically stable 5 mm left lower lobe nodule is considered benign. Nonobstructive left nephrolithiasis.    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 COMMENT:No significant keratinization is identified. Other basaloid carcinomas are in the differential diagnosis. No cell block material is available for further testing.    09/12/2018 Procedure    He underwent fine Needle Aspiration    10/04/2018 PET scan    1. Significant progression of local recurrence laterally in the mid right neck with an enlarging, increasingly hypermetabolic soft tissue mass. This involves the right sternocleidomastoid muscle. 2. Small lymph nodes in the right axilla are increasingly hypermetabolic. These are nonspecific and potentially reactive, although could reflect a small metastases. Small hypermetabolic nodule in the left suprasternal notch is unchanged. 3. No other evidence of metastatic disease.     10/07/2018 -  Chemotherapy    The patient had cisplatin plus gemzar    12/07/2018 Imaging    1. Decreased size of lateral right neck mass. 2. Unchanged soft tissue nodule  in the suprasternal notch. 3. No evidence of new metastatic disease in the neck.      REVIEW OF SYSTEMS:   Constitutional: Denies fevers, chills or abnormal weight loss Eyes: Denies blurriness of vision Ears, nose, mouth, throat, and face: Denies mucositis or sore throat Respiratory: Denies cough, dyspnea or wheezes Cardiovascular: Denies palpitation, chest discomfort or lower extremity swelling Skin: Denies abnormal skin rashes Lymphatics: Denies new lymphadenopathy or easy bruising Neurological:Denies numbness, tingling or new weaknesses Behavioral/Psych: Mood is stable, no new changes  All other systems were reviewed with the patient and are negative.  I have reviewed the past medical history, past surgical history, social history and family history with the patient and they are unchanged from previous note.  ALLERGIES:  is allergic to phenergan [promethazine hcl]; heparin; and clindamycin.  MEDICATIONS:  Current Outpatient Medications  Medication Sig Dispense Refill  . cetirizine (ZYRTEC) 10 MG tablet  Take 10 mg by mouth daily.    Marland Kitchen levothyroxine (SYNTHROID, LEVOTHROID) 137 MCG tablet TAKE 1 TABLET(137 MCG) BY MOUTH DAILY BEFORE BREAKFAST (Patient taking differently: Take 137 mcg by mouth daily before breakfast. ) 90 tablet 0  . lidocaine (XYLOCAINE) 2 % solution Use as directed 5 mLs in the mouth or throat every 3 (three) hours as needed for mouth pain. Swish, gargle and spit 100 mL 2  . LORazepam (ATIVAN) 1 MG tablet Take 1 tablet (1 mg total) by mouth every 8 (eight) hours as needed for anxiety. 30 tablet 3  . magic mouthwash SOLN Take 10 mLs by mouth 4 (four) times daily as needed for mouth pain. Swish and swallow or spit 240 mL 2  . mirtazapine (REMERON) 15 MG tablet Take 1 tablet (15 mg total) by mouth at bedtime. 30 tablet 1  . omeprazole (PRILOSEC) 20 MG capsule Take 1 capsule (20 mg total) by mouth daily. 30 capsule 5  . ondansetron (ZOFRAN) 8 MG tablet Take 1 tablet (8  mg total) by mouth every 8 (eight) hours as needed for nausea or vomiting. 90 tablet 1  . oxyCODONE (OXY IR/ROXICODONE) 5 MG immediate release tablet Take 1 tablet (5 mg total) by mouth every 8 (eight) hours as needed for pain. 30 tablet 0  . PARoxetine (PAXIL) 20 MG tablet Take 1 tablet (20 mg total) by mouth daily. 30 tablet 5  . polyethylene glycol (MIRALAX) packet Take 17 g by mouth daily. (Patient taking differently: Take 17 g by mouth daily as needed for mild constipation or moderate constipation. ) 14 each 3  . prochlorperazine (COMPAZINE) 10 MG tablet Take 1 tablet (10 mg total) by mouth every 12 (twelve) hours as needed (Nausea or vomiting). 30 tablet 1  . senna-docusate (SENOKOT-S) 8.6-50 MG tablet Take 2 tablets by mouth 3 (three) times daily. 90 tablet 0   No current facility-administered medications for this visit.     PHYSICAL EXAMINATION: ECOG PERFORMANCE STATUS: 1 - Symptomatic but completely ambulatory  Vitals:   12/09/18 1057  BP: 125/80  Pulse: 91  Resp: 18  Temp: 98 F (36.7 C)  SpO2: 100%   Filed Weights   12/09/18 1057  Weight: 193 lb 9.6 oz (87.8 kg)    GENERAL:alert, no distress and comfortable Musculoskeletal:no cyanosis of digits and no clubbing  NEURO: alert & oriented x 3 with fluent speech, no focal motor/sensory deficits  LABORATORY DATA:  I have reviewed the data as listed    Component Value Date/Time   NA 140 12/01/2018 1034   NA 139 09/22/2017 0829   K 4.1 12/01/2018 1034   K 3.5 09/22/2017 0829   CL 100 12/01/2018 1034   CO2 28 12/01/2018 1034   CO2 26 09/22/2017 0829   GLUCOSE 126 (H) 12/01/2018 1034   GLUCOSE 133 09/22/2017 0829   BUN 18 12/01/2018 1034   BUN 14.1 09/22/2017 0829   CREATININE 1.03 12/01/2018 1034   CREATININE 0.9 09/22/2017 0829   CALCIUM 10.2 12/01/2018 1034   CALCIUM 9.1 09/22/2017 0829   PROT 7.5 12/01/2018 1034   PROT 6.8 09/22/2017 0829   ALBUMIN 4.4 12/01/2018 1034   ALBUMIN 4.1 09/22/2017 0829   AST 21  12/01/2018 1034   AST 22 09/22/2017 0829   ALT 31 12/01/2018 1034   ALT 30 09/22/2017 0829   ALKPHOS 63 12/01/2018 1034   ALKPHOS 55 09/22/2017 0829   BILITOT 0.3 12/01/2018 1034   BILITOT 0.35 09/22/2017 0829   GFRNONAA >60 12/01/2018  [place] 12/01/2018 1034    No results found for: SPEP, UPEP  Lab Results  Component Value Date   WBC 5.0 12/01/2018   NEUTROABS 3.0 12/01/2018   HGB 12.1 (L) 12/01/2018   HCT 36.9 (L) 12/01/2018   MCV 92.0 12/01/2018   PLT 305 12/01/2018      Chemistry      Component Value Date/Time   NA 140 12/01/2018 1034   NA 139 09/22/2017 0829   K 4.1 12/01/2018 1034   K 3.5 09/22/2017 0829   CL 100 12/01/2018 1034   CO2 28 12/01/2018 1034   CO2 26 09/22/2017 0829   BUN 18 12/01/2018 1034   BUN 14.1 09/22/2017 0829   CREATININE 1.03 12/01/2018 1034   CREATININE 0.9 09/22/2017 0829      Component Value Date/Time   CALCIUM 10.2 12/01/2018 1034   CALCIUM 9.1 09/22/2017 0829   ALKPHOS 63 12/01/2018 1034   ALKPHOS 55 09/22/2017 0829   AST 21 12/01/2018 1034   AST 22 09/22/2017 0829   ALT 31 12/01/2018 1034   ALT 30 09/22/2017 0829   BILITOT 0.3 12/01/2018 1034   BILITOT 0.35 09/22/2017 0829       RADIOGRAPHIC STUDIES: I have reviewed multiple imaging studies with the patient I have personally reviewed the radiological images as listed and agreed with the findings in the report. Ct Soft Tissue Neck W Contrast  Result Date: 12/07/2018 CLINICAL DATA:  Metastatic nasopharyngeal carcinoma. EXAM: CT NECK WITH CONTRAST TECHNIQUE: Multidetector CT imaging of the neck was performed using the standard protocol following the bolus administration of intravenous contrast. CONTRAST:  20mL OMNIPAQUE IOHEXOL 300 MG/ML  SOLN COMPARISON:  PET-CT 10/04/2018 and neck CT 05/19/2017 FINDINGS: Pharynx and larynx: Incomplete imaging of the nasopharynx. No mass identified. Widely patent airway. Salivary glands: Incomplete imaging of the parotid glands. Atrophic  bilateral parotid and left submandibular glands which may be related to prior radiation therapy. Absent right submandibular gland. Thyroid: Unremarkable. Lymph nodes: Soft tissue mass in the lateral right neck has decreased in size from the prior PET-CT, estimated to measure 2.8 x 2.3 cm (previously 3.4 x 2.9 cm) though with precise measurement limited by infiltrative, poorly defined margins. The mass is again noted to involve the right sternocleidomastoid muscle. The mass is located posterior to the right internal carotid artery with abnormal soft tissue again noted in the carotid space. Unchanged 14 x 8 mm soft tissue nodule in the suprasternal notch. No new suspicious or enlarging lymph nodes are identified in the neck. 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 Other major vascular structures of the neck are patent. Partially visualized right jugular Port-A-Cath. Visualized paranasal sinuses: Mucosal thickening/small mucous retention cysts inferiorly in the left greater than right maxillary sinuses. Skeleton: No suspicious osseous lesion. Upper chest: Stable appearance of asymmetric radiation changes in the left upper lobe. Other: None. IMPRESSION: 1. Decreased size of lateral right neck mass. 2. Unchanged soft tissue nodule in the suprasternal notch. 3. No evidence of new metastatic disease in the neck. Electronically Signed   By: Logan Bores M.D.   On: 12/07/2018 09:36    All questions were answered. The patient knows to call the clinic with any problems, questions or concerns. No barriers to learning was detected.  I spent 20 minutes counseling the patient face to face. The total time spent in the appointment was 30 minutes and more than 50% was on counseling and review of test results  [name],  MD 12/09/2018 1:47 PM

## 2018-12-09 NOTE — Assessment & Plan Note (Addendum)
I have reviewed CT imaging with the patient He had excellent response to treatment I recommend we complete 3 more cycles of treatment with 50% dose reduction of cisplatin I have scheduled him to resume treatment next week and I will see him back on day 8 of treatment.  He agreed with the plan of care

## 2018-12-09 NOTE — Telephone Encounter (Signed)
Gave avs and calendar waiting for approval for 3/13

## 2018-12-09 NOTE — Assessment & Plan Note (Signed)
He has less symptoms of nausea and vomiting with 50% dose reduction of cisplatin He felt that he only needed 1 day of IV fluids the day after chemotherapy and I will try to schedule that for him.  He will continue antiemetics

## 2018-12-09 NOTE — Assessment & Plan Note (Signed)
He is doing well with oxycodone taper.  He will continue the same

## 2018-12-13 ENCOUNTER — Telehealth: Payer: Self-pay | Admitting: Hematology and Oncology

## 2018-12-13 NOTE — Telephone Encounter (Signed)
Called regarding 3/13

## 2018-12-15 ENCOUNTER — Inpatient Hospital Stay: Payer: Medicaid Other

## 2018-12-15 ENCOUNTER — Other Ambulatory Visit: Payer: Self-pay

## 2018-12-15 DIAGNOSIS — C119 Malignant neoplasm of nasopharynx, unspecified: Secondary | ICD-10-CM

## 2018-12-15 DIAGNOSIS — C7800 Secondary malignant neoplasm of unspecified lung: Secondary | ICD-10-CM

## 2018-12-15 DIAGNOSIS — Z5111 Encounter for antineoplastic chemotherapy: Secondary | ICD-10-CM | POA: Diagnosis not present

## 2018-12-15 DIAGNOSIS — C760 Malignant neoplasm of head, face and neck: Secondary | ICD-10-CM

## 2018-12-15 LAB — CBC WITH DIFFERENTIAL/PLATELET
Abs Immature Granulocytes: 0.06 10*3/uL (ref 0.00–0.07)
Basophils Absolute: 0 10*3/uL (ref 0.0–0.1)
Basophils Relative: 1 %
Eosinophils Absolute: 0.1 10*3/uL (ref 0.0–0.5)
Eosinophils Relative: 2 %
HCT: 37.6 % — ABNORMAL LOW (ref 39.0–52.0)
Hemoglobin: 12 g/dL — ABNORMAL LOW (ref 13.0–17.0)
Immature Granulocytes: 2 %
Lymphocytes Relative: 26 %
Lymphs Abs: 1.1 10*3/uL (ref 0.7–4.0)
MCH: 30.5 pg (ref 26.0–34.0)
MCHC: 31.9 g/dL (ref 30.0–36.0)
MCV: 95.4 fL (ref 80.0–100.0)
Monocytes Absolute: 0.8 10*3/uL (ref 0.1–1.0)
Monocytes Relative: 20 %
Neutro Abs: 2 10*3/uL (ref 1.7–7.7)
Neutrophils Relative %: 49 %
PLATELETS: 323 10*3/uL (ref 150–400)
RBC: 3.94 MIL/uL — AB (ref 4.22–5.81)
RDW: 17.4 % — ABNORMAL HIGH (ref 11.5–15.5)
WBC: 4 10*3/uL (ref 4.0–10.5)
nRBC: 1.2 % — ABNORMAL HIGH (ref 0.0–0.2)

## 2018-12-15 LAB — COMPREHENSIVE METABOLIC PANEL
ALT: 32 U/L (ref 0–44)
AST: 22 U/L (ref 15–41)
Albumin: 4.4 g/dL (ref 3.5–5.0)
Alkaline Phosphatase: 63 U/L (ref 38–126)
Anion gap: 12 (ref 5–15)
BUN: 11 mg/dL (ref 6–20)
CHLORIDE: 103 mmol/L (ref 98–111)
CO2: 27 mmol/L (ref 22–32)
Calcium: 9.7 mg/dL (ref 8.9–10.3)
Creatinine, Ser: 1.14 mg/dL (ref 0.61–1.24)
GFR calc Af Amer: >60 mL/min (ref >60)
Glucose, Bld: 153 mg/dL — ABNORMAL HIGH (ref 70–99)
Potassium: 4.2 mmol/L (ref 3.5–5.1)
Sodium: 142 mmol/L (ref 135–145)
Total Bilirubin: 0.3 mg/dL (ref 0.3–1.2)
Total Protein: 7.3 g/dL (ref 6.5–8.1)

## 2018-12-15 LAB — MAGNESIUM: MAGNESIUM: 1.8 mg/dL (ref 1.7–2.4)

## 2018-12-16 ENCOUNTER — Inpatient Hospital Stay: Payer: Medicaid Other

## 2018-12-16 ENCOUNTER — Other Ambulatory Visit: Payer: Self-pay

## 2018-12-16 VITALS — BP 132/79 | HR 100 | Temp 98.5°F | Resp 17

## 2018-12-16 DIAGNOSIS — C760 Malignant neoplasm of head, face and neck: Secondary | ICD-10-CM

## 2018-12-16 DIAGNOSIS — Z7189 Other specified counseling: Secondary | ICD-10-CM

## 2018-12-16 DIAGNOSIS — Z5111 Encounter for antineoplastic chemotherapy: Secondary | ICD-10-CM | POA: Diagnosis not present

## 2018-12-16 DIAGNOSIS — Z95828 Presence of other vascular implants and grafts: Secondary | ICD-10-CM

## 2018-12-16 DIAGNOSIS — C7800 Secondary malignant neoplasm of unspecified lung: Secondary | ICD-10-CM

## 2018-12-16 MED ORDER — PALONOSETRON HCL INJECTION 0.25 MG/5ML
INTRAVENOUS | Status: AC
  Filled 2018-12-16: qty 5

## 2018-12-16 MED ORDER — SODIUM CHLORIDE 0.9 % IV SOLN
Freq: Once | INTRAVENOUS | Status: AC
  Administered 2018-12-16: 09:00:00 via INTRAVENOUS
  Filled 2018-12-16: qty 250

## 2018-12-16 MED ORDER — SODIUM CHLORIDE 0.9 % IV SOLN
Freq: Once | INTRAVENOUS | Status: AC
  Filled 2018-12-16: qty 250

## 2018-12-16 MED ORDER — ANTICOAGULANT SODIUM CITRATE 4% (200MG/5ML) IV SOLN
5.0000 mL | Freq: Once | Status: AC
  Administered 2018-12-16: 5 mL
  Filled 2018-12-16: qty 5

## 2018-12-16 MED ORDER — PALONOSETRON HCL INJECTION 0.25 MG/5ML
0.2500 mg | Freq: Once | INTRAVENOUS | Status: AC
  Administered 2018-12-16: 0.25 mg via INTRAVENOUS

## 2018-12-16 MED ORDER — SODIUM CHLORIDE 0.9% FLUSH
10.0000 mL | INTRAVENOUS | Status: DC | PRN
  Administered 2018-12-16: 10 mL
  Filled 2018-12-16: qty 10

## 2018-12-16 MED ORDER — SODIUM CHLORIDE 0.9 % IV SOLN
Freq: Once | INTRAVENOUS | Status: AC
  Administered 2018-12-16: 12:00:00 via INTRAVENOUS
  Filled 2018-12-16: qty 5

## 2018-12-16 MED ORDER — POTASSIUM CHLORIDE 2 MEQ/ML IV SOLN
Freq: Once | INTRAVENOUS | Status: AC
  Administered 2018-12-16: 09:00:00 via INTRAVENOUS
  Filled 2018-12-16: qty 10

## 2018-12-16 MED ORDER — SODIUM CHLORIDE 0.9 % IV SOLN
40.0000 mg/m2 | Freq: Once | INTRAVENOUS | Status: AC
  Administered 2018-12-16: 81 mg via INTRAVENOUS
  Filled 2018-12-16: qty 81

## 2018-12-16 MED ORDER — SODIUM CHLORIDE 0.9 % IV SOLN
2000.0000 mg | Freq: Once | INTRAVENOUS | Status: AC
  Administered 2018-12-16: 2000 mg via INTRAVENOUS
  Filled 2018-12-16: qty 52.6

## 2018-12-16 NOTE — Patient Instructions (Signed)
[facility] Discharge Instructions for Patients Receiving Chemotherapy  Today you received the following chemotherapy agents: Gemcitabine (Gemzar) and Cisplatin (Platinol)  To help prevent nausea and vomiting after your treatment, we encourage you to take your nausea medication as directed. Received Aloxi during treatment today-->Take your Compazine prescription (not Zofran) for the next 3 days as needed.    If you develop nausea and vomiting that is not controlled by your nausea medication, call the clinic.   BELOW ARE SYMPTOMS THAT SHOULD BE REPORTED IMMEDIATELY:  *FEVER GREATER THAN 100.5 F  *CHILLS WITH OR WITHOUT FEVER  NAUSEA AND VOMITING THAT IS NOT CONTROLLED WITH YOUR NAUSEA MEDICATION  *UNUSUAL SHORTNESS OF BREATH  *UNUSUAL BRUISING OR BLEEDING  TENDERNESS IN MOUTH AND THROAT WITH OR WITHOUT PRESENCE OF ULCERS  *URINARY PROBLEMS  *BOWEL PROBLEMS  UNUSUAL RASH Items with * indicate a potential emergency and should be followed up as soon as possible.  Feel free to call the clinic should you have any questions or concerns. The clinic phone number is [phone].  Please show the [facility] at check-in to the Emergency Department and triage nurse.

## 2018-12-17 ENCOUNTER — Inpatient Hospital Stay: Payer: Medicaid Other

## 2018-12-17 ENCOUNTER — Other Ambulatory Visit: Payer: Self-pay

## 2018-12-17 VITALS — BP 114/57 | HR 81 | Temp 97.5°F | Resp 16

## 2018-12-17 DIAGNOSIS — C760 Malignant neoplasm of head, face and neck: Secondary | ICD-10-CM

## 2018-12-17 DIAGNOSIS — Z95828 Presence of other vascular implants and grafts: Secondary | ICD-10-CM

## 2018-12-17 DIAGNOSIS — Z5111 Encounter for antineoplastic chemotherapy: Secondary | ICD-10-CM | POA: Diagnosis not present

## 2018-12-17 DIAGNOSIS — C7800 Secondary malignant neoplasm of unspecified lung: Secondary | ICD-10-CM

## 2018-12-17 MED ORDER — SODIUM CHLORIDE 0.9 % IV SOLN
Freq: Once | INTRAVENOUS | Status: AC
  Administered 2018-12-17: 10:00:00 via INTRAVENOUS
  Filled 2018-12-17: qty 250

## 2018-12-17 MED ORDER — ANTICOAGULANT SODIUM CITRATE 4% (200MG/5ML) IV SOLN: 5.0000 mL | Freq: Once | Status: DC

## 2018-12-17 MED ORDER — ONDANSETRON HCL 4 MG/2ML IJ SOLN: 8.0000 mg | Freq: Once | INTRAMUSCULAR | Status: DC

## 2018-12-17 MED ORDER — SODIUM CHLORIDE 0.9% FLUSH
10.0000 mL | Freq: Once | INTRAVENOUS | Status: AC | PRN
  Administered 2018-12-17: 10 mL
  Filled 2018-12-17: qty 10

## 2018-12-19 ENCOUNTER — Telehealth: Payer: Self-pay

## 2018-12-19 ENCOUNTER — Other Ambulatory Visit: Payer: Self-pay | Admitting: Hematology and Oncology

## 2018-12-19 MED ORDER — OXYCODONE HCL 5 MG PO TABS: 5.0000 mg | ORAL_TABLET | Freq: Three times a day (TID) | ORAL | 0 refills | Status: DC | PRN

## 2018-12-19 NOTE — Telephone Encounter (Signed)
done

## 2018-12-19 NOTE — Telephone Encounter (Signed)
He called requesting refill on Oxycodone ask that it be sent to [facility].

## 2018-12-23 ENCOUNTER — Other Ambulatory Visit: Payer: Self-pay

## 2018-12-23 ENCOUNTER — Inpatient Hospital Stay: Payer: Medicaid Other

## 2018-12-23 ENCOUNTER — Inpatient Hospital Stay (HOSPITAL_BASED_OUTPATIENT_CLINIC_OR_DEPARTMENT_OTHER): Payer: Medicaid Other | Admitting: Hematology and Oncology

## 2018-12-23 ENCOUNTER — Encounter: Payer: Self-pay | Admitting: Hematology and Oncology

## 2018-12-23 DIAGNOSIS — C7800 Secondary malignant neoplasm of unspecified lung: Secondary | ICD-10-CM

## 2018-12-23 DIAGNOSIS — C119 Malignant neoplasm of nasopharynx, unspecified: Secondary | ICD-10-CM | POA: Diagnosis not present

## 2018-12-23 DIAGNOSIS — C760 Malignant neoplasm of head, face and neck: Secondary | ICD-10-CM

## 2018-12-23 DIAGNOSIS — Z95828 Presence of other vascular implants and grafts: Secondary | ICD-10-CM

## 2018-12-23 DIAGNOSIS — Z5111 Encounter for antineoplastic chemotherapy: Secondary | ICD-10-CM | POA: Diagnosis not present

## 2018-12-23 DIAGNOSIS — G893 Neoplasm related pain (acute) (chronic): Secondary | ICD-10-CM | POA: Diagnosis not present

## 2018-12-23 DIAGNOSIS — Z7189 Other specified counseling: Secondary | ICD-10-CM

## 2018-12-23 DIAGNOSIS — R112 Nausea with vomiting, unspecified: Secondary | ICD-10-CM | POA: Diagnosis not present

## 2018-12-23 LAB — CBC WITH DIFFERENTIAL/PLATELET
Abs Immature Granulocytes: 0.59 10*3/uL — ABNORMAL HIGH (ref 0.00–0.07)
BASOS PCT: 1 %
Basophils Absolute: 0.1 10*3/uL (ref 0.0–0.1)
EOS ABS: 0 10*3/uL (ref 0.0–0.5)
Eosinophils Relative: 1 %
HEMATOCRIT: 33.6 % — AB (ref 39.0–52.0)
Hemoglobin: 11.1 g/dL — ABNORMAL LOW (ref 13.0–17.0)
Immature Granulocytes: 14 %
Lymphocytes Relative: 31 %
Lymphs Abs: 1.3 10*3/uL (ref 0.7–4.0)
MCH: 30.6 pg (ref 26.0–34.0)
MCHC: 33 g/dL (ref 30.0–36.0)
MCV: 92.6 fL (ref 80.0–100.0)
Monocytes Absolute: 0.3 10*3/uL (ref 0.1–1.0)
Monocytes Relative: 8 %
Neutro Abs: 1.9 10*3/uL (ref 1.7–7.7)
Neutrophils Relative %: 45 %
Platelets: 299 10*3/uL (ref 150–400)
RBC: 3.63 MIL/uL — ABNORMAL LOW (ref 4.22–5.81)
RDW: 15.7 % — AB (ref 11.5–15.5)
WBC: 4.2 10*3/uL (ref 4.0–10.5)
nRBC: 3.1 % — ABNORMAL HIGH (ref 0.0–0.2)

## 2018-12-23 LAB — COMPREHENSIVE METABOLIC PANEL
ALT: 31 U/L (ref 0–44)
AST: 21 U/L (ref 15–41)
Albumin: 4.2 g/dL (ref 3.5–5.0)
Alkaline Phosphatase: 60 U/L (ref 38–126)
Anion gap: 12 (ref 5–15)
BUN: 16 mg/dL (ref 6–20)
CO2: 25 mmol/L (ref 22–32)
Calcium: 9.3 mg/dL (ref 8.9–10.3)
Chloride: 103 mmol/L (ref 98–111)
Creatinine, Ser: 0.96 mg/dL (ref 0.61–1.24)
GFR calc Af Amer: >60 mL/min (ref >60)
GFR calc non Af Amer: >60 mL/min (ref >60)
Glucose, Bld: 169 mg/dL — ABNORMAL HIGH (ref 70–99)
Potassium: 3.8 mmol/L (ref 3.5–5.1)
Sodium: 140 mmol/L (ref 135–145)
TOTAL PROTEIN: 7 g/dL (ref 6.5–8.1)
Total Bilirubin: 0.3 mg/dL (ref 0.3–1.2)

## 2018-12-23 LAB — MAGNESIUM: Magnesium: 1.8 mg/dL (ref 1.7–2.4)

## 2018-12-23 MED ORDER — ANTICOAGULANT SODIUM CITRATE 4% (200MG/5ML) IV SOLN
5.0000 mL | Freq: Once | Status: AC
  Administered 2018-12-23: 5 mL
  Filled 2018-12-23 (×2): qty 5

## 2018-12-23 MED ORDER — ALTEPLASE 2 MG IJ SOLR
2.0000 mg | Freq: Once | INTRAMUSCULAR | Status: DC | PRN
  Filled 2018-12-23: qty 2

## 2018-12-23 MED ORDER — SODIUM CHLORIDE 0.9 % IV SOLN
Freq: Once | INTRAVENOUS | Status: AC
  Administered 2018-12-23: 14:00:00 via INTRAVENOUS
  Filled 2018-12-23: qty 250

## 2018-12-23 MED ORDER — SODIUM CHLORIDE 0.9% FLUSH
10.0000 mL | Freq: Once | INTRAVENOUS | Status: AC | PRN
  Administered 2018-12-23: 10 mL
  Filled 2018-12-23: qty 10

## 2018-12-23 MED ORDER — HEPARIN SOD (PORK) LOCK FLUSH 100 UNIT/ML IV SOLN
250.0000 [IU] | Freq: Once | INTRAVENOUS | Status: DC | PRN
  Filled 2018-12-23: qty 5

## 2018-12-23 MED ORDER — SODIUM CHLORIDE 0.9% FLUSH
10.0000 mL | INTRAVENOUS | Status: DC | PRN
  Administered 2018-12-23: 10 mL
  Filled 2018-12-23: qty 10

## 2018-12-23 MED ORDER — ONDANSETRON HCL 4 MG/2ML IJ SOLN
INTRAMUSCULAR | Status: AC
  Filled 2018-12-23: qty 4

## 2018-12-23 MED ORDER — ONDANSETRON HCL 8 MG PO TABS
8.0000 mg | ORAL_TABLET | Freq: Once | ORAL | Status: AC
  Administered 2018-12-23: 8 mg via ORAL

## 2018-12-23 MED ORDER — SODIUM CHLORIDE 0.9 % IV SOLN
2000.0000 mg | Freq: Once | INTRAVENOUS | Status: AC
  Administered 2018-12-23: 2000 mg via INTRAVENOUS
  Filled 2018-12-23: qty 52.6

## 2018-12-23 MED ORDER — SODIUM CHLORIDE 0.9 % IV SOLN
Freq: Once | INTRAVENOUS | Status: DC
  Filled 2018-12-23: qty 250

## 2018-12-23 MED ORDER — HEPARIN SOD (PORK) LOCK FLUSH 100 UNIT/ML IV SOLN
500.0000 [IU] | Freq: Once | INTRAVENOUS | Status: DC | PRN
  Filled 2018-12-23: qty 5

## 2018-12-23 MED ORDER — SODIUM CHLORIDE 0.9% FLUSH
3.0000 mL | Freq: Once | INTRAVENOUS | Status: DC | PRN
  Filled 2018-12-23: qty 10

## 2018-12-23 MED ORDER — ONDANSETRON HCL 8 MG PO TABS
ORAL_TABLET | ORAL | Status: AC
  Filled 2018-12-23: qty 1

## 2018-12-23 NOTE — Assessment & Plan Note (Signed)
He is doing well with oxycodone taper.  He will continue the same

## 2018-12-23 NOTE — Progress Notes (Signed)
[facility] OFFICE PROGRESS NOTE  Patient Care Team: Patient, No Pcp Per as PCP - General (General Practice) [name], MD as Consulting Physician (Hematology and Oncology) Eppie Gibson, MD as Attending Physician (Radiation Oncology) Jodi Marble, MD as Consulting Physician (Otolaryngology) Philomena Doheny, MD as Referring Physician (Plastic Surgery) Irene Shipper, MD as Consulting Physician (Gastroenterology) System, Provider Not In  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[place]) I have reviewed CT imaging with the patient He had excellent response to treatment He tolerated recent 50% dose reduction of cisplatin well He will complete cycle 4-day 8 today We discussed the risk and benefits of discontinuing chemotherapy.  He is undecided I plan to call him next week for further discussion about plan of care  Cancer associated pain He is doing well with oxycodone taper.  He will continue the same  Nausea and vomiting He has less symptoms of nausea and vomiting with 50% dose reduction of cisplatin He will continue antiemetics as needed   No orders of the defined types were placed in this encounter.   INTERVAL HISTORY: Please see below for problem oriented charting. He returns for further follow-up He denies significant nausea with recent dose adjustment No recent infection, fever or chills He is able to tolerate slow oxycodone taper.  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 Pan-endoscopy elsewhere showed cancer from nasopharyngeal space.    01/04/2007 - 02/20/2007 Chemotherapy    He received 2 cycles of cisplatin and 5FU followed by concurrent chemo with weekly cisplatin and radiation. He only received 2 doses of chemo due to severe mucositis, nausea and weight loss.    04/05/2007 - 08/04/2007 Chemotherapy    He received 4 more courses of cisplatin with 5FU and had complete response    07/05/2009 Procedure    Fine-needle aspirate of the right level II lymph nodes come from recurrent metastatic disease. Repeat endoscopy and CT scan show no evidence of disease elsewhere.    07/08/2009 - 12/02/2009 Chemotherapy    He was given 6 cycles of carboplatin, 5-FU and docetaxel    12/03/2009 Surgery    He has surgery to the residual lymph node on the right neck which showed no evidence of disease.    02/22/2012 Imaging    Repeat imaging study showed large recurrent mass. He was referred elsewhere for further treatment.    05/03/2012 Surgery    He underwent left upper lobectomy.    04/29/2013 Imaging    PEt scan showed lesion on right level II B and lower lung was abnormal    06/03/2013 - 02/02/2014 Chemotherapy    He had 6 cycles of chemotherapy when he was found to have recurrence of cancer and had received oxaliplatin and capecitabine    06/07/2014 Imaging    PET CT scan showed persistent disease in the right neck lymph nodes and left lung    06/29/2014 Procedure    Accession:  [id number] repeat LUL biopsy confirmed metastatic cancer    07/18/2014 - 07/31/2014 Radiation Therapy    He received palliative radiation therapy to the lungs    10/10/2014 Imaging    CT scan of the chest, abdomen and pelvis show regression in the size of the lung nodule in the left upper lobe and stable pulmonary nodules    01/24/2015 Imaging    CT scan showed stable disease in neck and lung    06/19/2015 Imaging    CT scan of the neck and the chest show possible mild progression of the  nodule in the right side of the neck.    06/25/2015 Imaging    PET scan confirmed disease recurrence in the neck    07/07/2015 Imaging    He had [facility] neck [facility]    09/03/2015 - 08/26/2018 Chemotherapy    He received palliative chemo with Nivolumab    10/29/2015 Imaging    PET CT showed positive response to Rx    02/28/2016 Imaging    Ct abdomen showed abnormal thinkening in his stomach    03/03/2016 Imaging    CT: Right sternocleidomastoid muscle metastasis appears less distinct but otherwise not significantly changed in size or configuration since 06/19/2015.2. Left level 3 lymph node which was hypermetabolic by PET-CT in January 2017 appears slightly smaller    04/01/2016 Imaging    CT cervical spine showed no acute fracture or traumatic malalignment in the cervical spine    04/22/2016 Procedure    Port-a-cath placed.    06/16/2016 Imaging    Ct neck showed right sternocleidomastoid muscle metastasis is further decreased in conspicuity since May, and has mildly decreased in size since September 2016. Continued stability of sub-centimeter left cervical lymph nodes. No new or progressive metastatic disease in the neck.    06/16/2016 Imaging    CT chest showed stable masslike radiation fibrosis in the left upper lobe. Stable subcentimeter pulmonary nodules in the bilateral lower lobes. No new or progressive metastatic disease in the chest. Nonobstructing left renal stone.    10/13/2016 Imaging    Ct neck showed unchanged right sternocleidomastoid muscle metastasis. Unchanged subcentimeter left cervical lymph nodes. No evidence of new or progressive metastatic disease in the neck.    10/13/2016 Imaging    CT chest showed tiny hypervascular foci in the liver, not definitely seen on prior imaging of 06/16/2016 and 02/28/2016. Abdomen MRI without and with contrast recommended to further evaluate as metastatic disease is a concern. 2. Stable appearance of post treatment changes left upper lung and  scattered tiny bilateral pulmonary nodules.    02/11/2017 Imaging    Ct neck: Lymph node mass right posterior neck appears improved from the prior study. Small posterior lymph nodes on the left unchanged. Occluded right jugular vein unchanged.    02/11/2017 Imaging    1. Similar appearance of postsurgical and radiation changes in the left upper lobe. 2. Similar bilateral pulmonary nodules. 3. No thoracic adenopathy. 4. Subtle foci of post-contrast enhancement within the liver are suboptimally characterized on this nondedicated study. Likely similar. These could either be re-evaluated at followup or more entirely characterized with abdominal MRI. 5. Left nephrolithiasis.    05/19/2017 Imaging    Matted lymph node mass right posterior neck appears larger in the recent CT. Accurate measurements difficult due to infiltrating tumor margins and infiltration of the muscle. Right jugular vein again appears occluded or resected. Small left posterior lymph nodes stable. Left upper lobe airspace density stable and similar to  the prior CT    06/03/2017 PET scan    1. Hypermetabolic ill-defined right level IIb lymph node, about 1.3 cm in diameter with maximum SUV 9.5 (formerly 8.1). Appearance suspicious for residual/recurrent malignancy. No worrisome left-sided lesion. 2. Left suprahilar indistinct opacity demonstrates no worrisome hypermetabolic activity. The 5 mm left lower lobe pulmonary nodule is stable and not currently hypermetabolic although below sensitive PET-CT size thresholds. 3. Other imaging findings of potential clinical significance: Bilateral nonobstructive nephrolithiasis. Chronic bilateral maxillary sinusitis.    05/11/2018 PET scan    1. Continued chronic accentuated metabolic activity in the vicinity of right level IIB and the adjacent right sternocleidomastoid muscle, with ill definition of surrounding tissue planes. Maximum SUV is currently 8.1, formerly 9.5. Accentuated metabolic activity  is been present in this vicinity back through 06/25/2015, and there was also some low-level activity in this vicinity on 06/07/2014. Some of this may be from scarring and local muscular activity although clearly a component of residual tumor is difficult to exclude given the focally high activity. 2. Other imaging findings of potential clinical significance: Chronic bilateral maxillary sinusitis. Chronic scarring in the left upper lobe. Chronically stable 5 mm left lower lobe nodule is considered benign. Nonobstructive left nephrolithiasis.    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 COMMENT:No significant keratinization is identified. Other basaloid carcinomas are in the differential diagnosis. No cell block material is available for further testing.    09/12/2018 Procedure    He underwent fine Needle Aspiration    10/04/2018 PET scan    1. Significant progression of local recurrence laterally in the mid right neck with an enlarging, increasingly hypermetabolic soft tissue mass. This involves the right sternocleidomastoid muscle. 2. Small lymph nodes in the right axilla are increasingly hypermetabolic. These are nonspecific and potentially reactive, although could reflect a small metastases. Small hypermetabolic nodule in the left suprasternal notch is unchanged. 3. No other evidence of metastatic disease.     10/07/2018 -  Chemotherapy    The patient had cisplatin plus gemzar    12/07/2018 Imaging    1. Decreased size of lateral right neck mass. 2. Unchanged soft tissue nodule in the suprasternal notch. 3. No evidence of new metastatic disease in the neck.      REVIEW OF SYSTEMS:   Constitutional: Denies fevers, chills or abnormal weight loss Eyes: Denies blurriness of vision Ears, nose, mouth, throat, and face: Denies mucositis or sore  throat Respiratory: Denies cough, dyspnea or wheezes Cardiovascular: Denies palpitation, chest discomfort or lower extremity swelling Gastrointestinal:  Denies nausea, heartburn or change in bowel habits Skin: Denies abnormal skin rashes Lymphatics: Denies new lymphadenopathy or easy bruising Neurological:Denies numbness, tingling or new weaknesses Behavioral/Psych: Mood is stable, no new changes  All other systems were reviewed with the patient and are negative.  I have reviewed the past medical history, past surgical history, social history and family history with the patient and they are unchanged from previous note.  ALLERGIES:  is allergic to phenergan [promethazine hcl]; heparin; and clindamycin.  MEDICATIONS:  Current Outpatient Medications  Medication Sig Dispense Refill  . cetirizine (ZYRTEC) 10 MG tablet Take 10 mg by mouth daily.    Marland Kitchen levothyroxine (SYNTHROID, LEVOTHROID) 137 MCG tablet TAKE 1 TABLET(137 MCG) BY MOUTH DAILY BEFORE BREAKFAST (Patient taking differently: Take 137 mcg by mouth daily before breakfast. ) 90 tablet 0  . lidocaine (  XYLOCAINE) 2 % solution Use as directed 5 mLs in the mouth or throat every 3 (three) hours as needed for mouth pain. Swish, gargle and spit 100 mL 2  . LORazepam (ATIVAN) 1 MG tablet Take 1 tablet (1 mg total) by mouth every 8 (eight) hours as needed for anxiety. 30 tablet 3  . magic mouthwash SOLN Take 10 mLs by mouth 4 (four) times daily as needed for mouth pain. Swish and swallow or spit 240 mL 2  . mirtazapine (REMERON) 15 MG tablet Take 1 tablet (15 mg total) by mouth at bedtime. 30 tablet 1  . omeprazole (PRILOSEC) 20 MG capsule Take 1 capsule (20 mg total) by mouth daily. 30 capsule 5  . ondansetron (ZOFRAN) 8 MG tablet Take 1 tablet (8 mg total) by mouth every 8 (eight) hours as needed for nausea or vomiting. 90 tablet 1  . oxyCODONE (OXY IR/ROXICODONE) 5 MG immediate release tablet Take 1 tablet (5 mg total) by mouth every 8 (eight)  hours as needed. 30 tablet 0  . PARoxetine (PAXIL) 20 MG tablet Take 1 tablet (20 mg total) by mouth daily. 30 tablet 5  . polyethylene glycol (MIRALAX) packet Take 17 g by mouth daily. (Patient taking differently: Take 17 g by mouth daily as needed for mild constipation or moderate constipation. ) 14 each 3  . prochlorperazine (COMPAZINE) 10 MG tablet Take 1 tablet (10 mg total) by mouth every 12 (twelve) hours as needed (Nausea or vomiting). 30 tablet 1  . senna-docusate (SENOKOT-S) 8.6-50 MG tablet Take 2 tablets by mouth 3 (three) times daily. 90 tablet 0   No current facility-administered medications for this visit.    Facility-Administered Medications Ordered in Other Visits  Medication Dose Route Frequency Provider Last Rate Last Dose  . 0.9 %  sodium chloride infusion   Intravenous Once Jahrel Borthwick, MD      . 0.9 %  sodium chloride infusion   Intravenous Once Alvy Bimler, Kasyn Rolph, MD      . alteplase (CATHFLO ACTIVASE) injection 2 mg  2 mg Intracatheter Once PRN Alvy Bimler, Kiki Bivens, MD      . heparin lock flush 100 unit/mL  500 Units Intracatheter Once PRN Alvy Bimler, Kaydra Borgen, MD      . heparin lock flush 100 unit/mL  500 Units Intracatheter Once PRN Alvy Bimler, Jaskiran Pata, MD      . heparin lock flush 100 unit/mL  250 Units Intracatheter Once PRN Alvy Bimler, Jandel Patriarca, MD      . sodium chloride flush (NS) 0.9 % injection 10 mL  10 mL Intracatheter PRN Alvy Bimler, Sigfredo Schreier, MD   10 mL at 12/23/18 1624  . sodium chloride flush (NS) 0.9 % injection 3 mL  3 mL Intracatheter Once PRN [name], MD        PHYSICAL EXAMINATION: ECOG PERFORMANCE STATUS: 1 - Symptomatic but completely ambulatory  Vitals:   12/23/18 1351  BP: 129/77  Pulse: 98  Resp: 17  Temp: 98.2 F (36.8 C)  SpO2: 100%   Filed Weights   12/23/18 1351  Weight: 192 lb 1.6 oz (87.1 kg)    GENERAL:alert, no distress and comfortable SKIN: skin color, texture, turgor are normal, no rashes or significant lesions EYES: normal, Conjunctiva are pink and non-injected,  sclera clear OROPHARYNX:no exudate, no erythema and lips, buccal mucosa, and tongue normal  NECK: He has scarring on the right side of the neck LYMPH:  no palpable lymphadenopathy in the cervical, axillary or inguinal LUNGS: clear to auscultation and percussion with normal breathing  effort HEART: regular rate & rhythm and no murmurs and no lower extremity edema ABDOMEN:abdomen soft, non-tender and normal bowel sounds Musculoskeletal:no cyanosis of digits and no clubbing  NEURO: alert & oriented x 3 with fluent speech, no focal motor/sensory deficits  LABORATORY DATA:  I have reviewed the data as listed    Component Value Date/Time   NA 140 12/23/2018 1230   NA 139 09/22/2017 0829   K 3.8 12/23/2018 1230   K 3.5 09/22/2017 0829   CL 103 12/23/2018 1230   CO2 25 12/23/2018 1230   CO2 26 09/22/2017 0829   GLUCOSE 169 (H) 12/23/2018 1230   GLUCOSE 133 09/22/2017 0829   BUN 16 12/23/2018 1230   BUN 14.1 09/22/2017 0829   CREATININE 0.96 12/23/2018 1230   CREATININE 0.9 09/22/2017 0829   CALCIUM 9.3 12/23/2018 1230   CALCIUM 9.1 09/22/2017 0829   PROT 7.0 12/23/2018 1230   PROT 6.8 09/22/2017 0829   ALBUMIN 4.2 12/23/2018 1230   ALBUMIN 4.1 09/22/2017 0829   AST 21 12/23/2018 1230   AST 22 09/22/2017 0829   ALT 31 12/23/2018 1230   ALT 30 09/22/2017 0829   ALKPHOS 60 12/23/2018 1230   ALKPHOS 55 09/22/2017 0829   BILITOT 0.3 12/23/2018 1230   BILITOT 0.35 09/22/2017 0829   GFRNONAA >60 12/23/2018 1230   GFRAA >60 12/23/2018 1230    No results found for: SPEP, UPEP  Lab Results  Component Value Date   WBC 4.2 12/23/2018   NEUTROABS 1.9 12/23/2018   HGB 11.1 (L) 12/23/2018   HCT 33.6 (L) 12/23/2018   MCV 92.6 12/23/2018   PLT 299 12/23/2018      Chemistry      Component Value Date/Time   NA 140 12/23/2018 1230   NA 139 09/22/2017 0829   K 3.8 12/23/2018 1230   K 3.5 09/22/2017 0829   CL 103 12/23/2018 1230   CO2 25 12/23/2018 1230   CO2 26 09/22/2017 0829    BUN 16 12/23/2018 1230   BUN 14.1 09/22/2017 0829   CREATININE 0.96 12/23/2018 1230   CREATININE 0.9 09/22/2017 0829      Component Value Date/Time   CALCIUM 9.3 12/23/2018 1230   CALCIUM 9.1 09/22/2017 0829   ALKPHOS 60 12/23/2018 1230   ALKPHOS 55 09/22/2017 0829   AST 21 12/23/2018 1230   AST 22 09/22/2017 0829   ALT 31 12/23/2018 1230   ALT 30 09/22/2017 0829   BILITOT 0.3 12/23/2018 1230   BILITOT 0.35 09/22/2017 0829       RADIOGRAPHIC STUDIES: I have personally reviewed the radiological images as listed and agreed with the findings in the report. Ct Soft Tissue Neck W Contrast  Result Date: 12/07/2018 CLINICAL DATA:  Metastatic nasopharyngeal carcinoma. EXAM: CT NECK WITH CONTRAST TECHNIQUE: Multidetector CT imaging of the neck was performed using the standard protocol following the bolus administration of intravenous contrast. CONTRAST:  59mL OMNIPAQUE IOHEXOL 300 MG/ML  SOLN COMPARISON:  PET-CT 10/04/2018 and neck CT 05/19/2017 FINDINGS: Pharynx and larynx: Incomplete imaging of the nasopharynx. No mass identified. Widely patent airway. Salivary glands: Incomplete imaging of the parotid glands. Atrophic bilateral parotid and left submandibular glands which may be related to prior radiation therapy. Absent right submandibular gland. Thyroid: Unremarkable. Lymph nodes: Soft tissue mass in the lateral right neck has decreased in size from the prior PET-CT, estimated to measure 2.8 x 2.3 cm (previously 3.4 x 2.9 cm) though with precise measurement limited by infiltrative, poorly defined margins. The mass  is again noted to involve the right sternocleidomastoid muscle. The mass is located posterior to the right internal carotid artery with abnormal soft tissue again noted in the carotid space. Unchanged 14 x 8 mm soft tissue nodule in the suprasternal notch. No new suspicious or enlarging lymph nodes are identified in the neck. 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 Other major vascular structures of the neck are patent. Partially visualized right jugular Port-A-Cath. Visualized paranasal sinuses: Mucosal thickening/small mucous retention cysts inferiorly in the left greater than right maxillary sinuses. Skeleton: No suspicious osseous lesion. Upper chest: Stable appearance of asymmetric radiation changes in the left upper lobe. Other: None. IMPRESSION: 1. Decreased size of lateral right neck mass. 2. Unchanged soft tissue nodule in the suprasternal notch. 3. No evidence of new metastatic disease in the neck. Electronically Signed   By: Logan Bores M.D.   On: 12/07/2018 09:36    All questions were answered. The patient knows to call the clinic with any problems, questions or concerns. No barriers to learning was detected.  I spent 15 minutes counseling the patient face to face. The total time spent in the appointment was 20 minutes and more than 50% was on counseling and review of test results  [name], MD 12/23/2018 5:05 PM

## 2018-12-23 NOTE — Assessment & Plan Note (Signed)
I have reviewed CT imaging with the patient He had excellent response to treatment He tolerated recent 50% dose reduction of cisplatin well He will complete cycle 4-day 8 today We discussed the risk and benefits of discontinuing chemotherapy.  He is undecided I plan to call him next week for further discussion about plan of care

## 2018-12-23 NOTE — Patient Instructions (Signed)
[facility] Discharge Instructions for Patients Receiving Chemotherapy  Today you received the following chemotherapy agents Gemzar To help prevent nausea and vomiting after your treatment, we encourage you to take your nausea medication as prescribed.  If you develop nausea and vomiting that is not controlled by your nausea medication, call the clinic.   BELOW ARE SYMPTOMS THAT SHOULD BE REPORTED IMMEDIATELY:  *FEVER GREATER THAN 100.5 F  *CHILLS WITH OR WITHOUT FEVER  NAUSEA AND VOMITING THAT IS NOT CONTROLLED WITH YOUR NAUSEA MEDICATION  *UNUSUAL SHORTNESS OF BREATH  *UNUSUAL BRUISING OR BLEEDING  TENDERNESS IN MOUTH AND THROAT WITH OR WITHOUT PRESENCE OF ULCERS  *URINARY PROBLEMS  *BOWEL PROBLEMS  UNUSUAL RASH Items with * indicate a potential emergency and should be followed up as soon as possible.  Feel free to call the clinic should you have any questions or concerns. The clinic phone number is [phone].  Please show the CHEMO ALERT CARD at check-in to the Emergency Department and triage nurse.   

## 2018-12-23 NOTE — Assessment & Plan Note (Signed)
He has less symptoms of nausea and vomiting with 50% dose reduction of cisplatin He will continue antiemetics as needed

## 2018-12-26 ENCOUNTER — Telehealth: Payer: Self-pay | Admitting: Hematology and Oncology

## 2018-12-26 ENCOUNTER — Telehealth: Payer: Self-pay

## 2018-12-26 NOTE — Telephone Encounter (Signed)
Called and given below. He verbalized understanding. He does not want continue chemo. Scheduling message sent to schedule in 6 weeks, for lab, flush and appt with Dr. Alvy Bimler.

## 2018-12-26 NOTE — Telephone Encounter (Signed)
Called regarding 5/4

## 2018-12-26 NOTE — Telephone Encounter (Signed)
-----   Message from [name], MD sent at 12/26/2018  8:27 AM EDT ----- Regarding: decision Can you ask if he wants to continue chemo?

## 2018-12-29 ENCOUNTER — Other Ambulatory Visit: Payer: Self-pay

## 2018-12-29 MED ORDER — ONDANSETRON HCL 8 MG PO TABS: 8.0000 mg | ORAL_TABLET | Freq: Three times a day (TID) | ORAL | 1 refills | Status: DC | PRN

## 2019-01-10 ENCOUNTER — Other Ambulatory Visit: Payer: Self-pay | Admitting: Hematology and Oncology

## 2019-01-10 ENCOUNTER — Telehealth: Payer: Self-pay

## 2019-01-10 MED ORDER — OXYCODONE HCL 5 MG PO TABS: 5.0000 mg | ORAL_TABLET | Freq: Three times a day (TID) | ORAL | 0 refills | Status: DC | PRN

## 2019-01-10 NOTE — Telephone Encounter (Signed)
-----   Message from [name], MD sent at 01/10/2019  9:21 AM EDT ----- Regarding: RE: needs refill on oxycodone done ----- Message ----- From: Paulla Dolly, RN Sent: 01/10/2019   8:17 AM EDT To: [name], MD Subject: needs refill on oxycodone                      He left voicemail msg.  Needs refill please

## 2019-01-10 NOTE — Telephone Encounter (Signed)
LVM letting pt know his prescription for oxycodone has been sent to pharmacy

## 2019-02-01 ENCOUNTER — Telehealth: Payer: Self-pay

## 2019-02-01 NOTE — Telephone Encounter (Signed)
He called and left a message he needs a refill on Oxycodone and ask that it be sent to pharmacy.

## 2019-02-02 ENCOUNTER — Other Ambulatory Visit: Payer: Self-pay | Admitting: Hematology and Oncology

## 2019-02-02 MED ORDER — OXYCODONE HCL 5 MG PO TABS: 5.0000 mg | ORAL_TABLET | Freq: Three times a day (TID) | ORAL | 0 refills | Status: DC | PRN

## 2019-02-02 NOTE — Telephone Encounter (Signed)
done

## 2019-02-06 ENCOUNTER — Encounter: Payer: Self-pay | Admitting: Hematology and Oncology

## 2019-02-06 ENCOUNTER — Encounter: Payer: Self-pay | Admitting: *Deleted

## 2019-02-06 ENCOUNTER — Other Ambulatory Visit: Payer: Self-pay

## 2019-02-06 ENCOUNTER — Inpatient Hospital Stay: Payer: Medicaid Other | Attending: Hematology and Oncology

## 2019-02-06 ENCOUNTER — Inpatient Hospital Stay (HOSPITAL_BASED_OUTPATIENT_CLINIC_OR_DEPARTMENT_OTHER): Payer: Medicaid Other | Admitting: Hematology and Oncology

## 2019-02-06 ENCOUNTER — Other Ambulatory Visit: Payer: Self-pay | Admitting: Hematology and Oncology

## 2019-02-06 ENCOUNTER — Telehealth: Payer: Self-pay | Admitting: *Deleted

## 2019-02-06 ENCOUNTER — Inpatient Hospital Stay: Payer: Medicaid Other

## 2019-02-06 DIAGNOSIS — G893 Neoplasm related pain (acute) (chronic): Secondary | ICD-10-CM | POA: Insufficient documentation

## 2019-02-06 DIAGNOSIS — C7800 Secondary malignant neoplasm of unspecified lung: Secondary | ICD-10-CM

## 2019-02-06 DIAGNOSIS — T451X5A Adverse effect of antineoplastic and immunosuppressive drugs, initial encounter: Secondary | ICD-10-CM | POA: Diagnosis not present

## 2019-02-06 DIAGNOSIS — G620 Drug-induced polyneuropathy: Secondary | ICD-10-CM

## 2019-02-06 DIAGNOSIS — C760 Malignant neoplasm of head, face and neck: Secondary | ICD-10-CM

## 2019-02-06 DIAGNOSIS — C119 Malignant neoplasm of nasopharynx, unspecified: Secondary | ICD-10-CM | POA: Insufficient documentation

## 2019-02-06 DIAGNOSIS — Z95828 Presence of other vascular implants and grafts: Secondary | ICD-10-CM

## 2019-02-06 DIAGNOSIS — Z79899 Other long term (current) drug therapy: Secondary | ICD-10-CM | POA: Insufficient documentation

## 2019-02-06 LAB — CMP (CANCER CENTER ONLY)
ALT: 48 U/L — ABNORMAL HIGH (ref 0–44)
AST: 31 U/L (ref 15–41)
Albumin: 4 g/dL (ref 3.5–5.0)
Alkaline Phosphatase: 61 U/L (ref 38–126)
Anion gap: 10 (ref 5–15)
BUN: 10 mg/dL (ref 6–20)
CO2: 28 mmol/L (ref 22–32)
Calcium: 9 mg/dL (ref 8.9–10.3)
Chloride: 104 mmol/L (ref 98–111)
Creatinine: 0.93 mg/dL (ref 0.61–1.24)
GFR, Est AFR Am: >60 mL/min (ref >60)
GFR, Estimated: >60 mL/min (ref >60)
Glucose, Bld: 182 mg/dL — ABNORMAL HIGH (ref 70–99)
Potassium: 3.8 mmol/L (ref 3.5–5.1)
Sodium: 142 mmol/L (ref 135–145)
Total Bilirubin: 0.2 mg/dL — ABNORMAL LOW (ref 0.3–1.2)
Total Protein: 7.1 g/dL (ref 6.5–8.1)

## 2019-02-06 LAB — CBC WITH DIFFERENTIAL/PLATELET
Abs Immature Granulocytes: 0.02 10*3/uL (ref 0.00–0.07)
Basophils Absolute: 0 10*3/uL (ref 0.0–0.1)
Basophils Relative: 1 %
Eosinophils Absolute: 0.2 10*3/uL (ref 0.0–0.5)
Eosinophils Relative: 3 %
HCT: 41.8 % (ref 39.0–52.0)
Hemoglobin: 13.3 g/dL (ref 13.0–17.0)
Immature Granulocytes: 0 %
Lymphocytes Relative: 34 %
Lymphs Abs: 2.1 10*3/uL (ref 0.7–4.0)
MCH: 30.5 pg (ref 26.0–34.0)
MCHC: 31.8 g/dL (ref 30.0–36.0)
MCV: 95.9 fL (ref 80.0–100.0)
Monocytes Absolute: 0.6 10*3/uL (ref 0.1–1.0)
Monocytes Relative: 10 %
Neutro Abs: 3.3 10*3/uL (ref 1.7–7.7)
Neutrophils Relative %: 52 %
Platelets: 252 10*3/uL (ref 150–400)
RBC: 4.36 MIL/uL (ref 4.22–5.81)
RDW: 14.3 % (ref 11.5–15.5)
WBC: 6.3 10*3/uL (ref 4.0–10.5)
nRBC: 0 % (ref 0.0–0.2)

## 2019-02-06 LAB — TSH: TSH: 10.696 u[IU]/mL — ABNORMAL HIGH (ref 0.320–4.118)

## 2019-02-06 LAB — MAGNESIUM: Magnesium: 1.9 mg/dL (ref 1.7–2.4)

## 2019-02-06 MED ORDER — SODIUM CHLORIDE 0.9% FLUSH
10.0000 mL | Freq: Once | INTRAVENOUS | Status: AC
  Administered 2019-02-06: 10 mL
  Filled 2019-02-06: qty 10

## 2019-02-06 MED ORDER — ANTICOAGULANT SODIUM CITRATE 4% (200MG/5ML) IV SOLN
5.0000 mL | Freq: Once | Status: AC
  Administered 2019-02-06: 5 mL
  Filled 2019-02-06: qty 5

## 2019-02-06 NOTE — Assessment & Plan Note (Signed)
Clinically, he has no signs or symptoms to suggest cancer recurrence We will continue chemotherapy holiday I will see him back in another 8 weeks for further evaluation

## 2019-02-06 NOTE — Telephone Encounter (Signed)
Attempted to contact the patient regarding his appts. No answer and voicemail was full, unable to leave a message. Mailed the patient a calendar and letter.

## 2019-02-06 NOTE — Assessment & Plan Note (Signed)
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

## 2019-02-06 NOTE — Assessment & Plan Note (Signed)
He is attempting to wean himself off oxycodone.  I encouraged him to continue to taper

## 2019-02-06 NOTE — Progress Notes (Signed)
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   INTERVAL HISTORY: Please see below for problem oriented charting. 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 Pan-endoscopy elsewhere showed cancer from nasopharyngeal space.    01/04/2007 - 02/20/2007  Chemotherapy    He received 2 cycles of cisplatin and 5FU followed by concurrent chemo with weekly cisplatin and radiation. He only received 2 doses of chemo due to severe mucositis, nausea and weight loss.    04/05/2007 - 08/04/2007 Chemotherapy    He received 4 more courses of cisplatin with 5FU and had complete response    07/05/2009 Procedure    Fine-needle aspirate of the right level II lymph nodes come from recurrent metastatic disease. Repeat endoscopy and CT scan show no evidence of disease elsewhere.    07/08/2009 - 12/02/2009 Chemotherapy    He was given 6 cycles of carboplatin, 5-FU and docetaxel    12/03/2009 Surgery    He has surgery to the residual lymph node on the right neck which showed no evidence of disease.    02/22/2012 Imaging    Repeat imaging study showed large recurrent mass. He was referred elsewhere for further treatment.    05/03/2012 Surgery    He underwent left upper lobectomy.    04/29/2013 Imaging    PEt scan showed lesion on right level II B and lower lung was abnormal    06/03/2013 - 02/02/2014 Chemotherapy    He had 6 cycles of chemotherapy when he was found to have recurrence of cancer and had received oxaliplatin and capecitabine    06/07/2014 Imaging    PET CT scan showed persistent disease in the right neck lymph nodes and left lung    06/29/2014 Procedure    Accession: [id number] repeat LUL biopsy confirmed metastatic cancer    07/18/2014 - 07/31/2014 Radiation Therapy  He received palliative radiation therapy to the lungs    10/10/2014 Imaging    CT scan of the chest, abdomen and pelvis show regression in the size of the lung nodule in the left upper lobe and stable pulmonary nodules    01/24/2015 Imaging    CT scan showed stable disease in neck and lung    06/19/2015 Imaging    CT scan of the neck and the chest show possible mild progression of the nodule in the right side of the neck.    06/25/2015 Imaging    PET scan confirmed disease recurrence  in the neck    07/07/2015 Imaging    He had [facility] neck [facility]    09/03/2015 - 08/26/2018 Chemotherapy    He received palliative chemo with Nivolumab    10/29/2015 Imaging    PET CT showed positive response to Rx    02/28/2016 Imaging    Ct abdomen showed abnormal thinkening in his stomach    03/03/2016 Imaging    CT: Right sternocleidomastoid muscle metastasis appears less distinct but otherwise not significantly changed in size or configuration since 06/19/2015.2. Left level 3 lymph node which was hypermetabolic by PET-CT in January 2017 appears slightly smaller    04/01/2016 Imaging    CT cervical spine showed no acute fracture or traumatic malalignment in the cervical spine    04/22/2016 Procedure    Port-a-cath placed.    06/16/2016 Imaging    Ct neck showed right sternocleidomastoid muscle metastasis is further decreased in conspicuity since May, and has mildly decreased in size since September 2016. Continued stability of sub-centimeter left cervical lymph nodes. No new or progressive metastatic disease in the neck.    06/16/2016 Imaging    CT chest showed stable masslike radiation fibrosis in the left upper lobe. Stable subcentimeter pulmonary nodules in the bilateral lower lobes. No new or progressive metastatic disease in the chest. Nonobstructing left renal stone.    10/13/2016 Imaging    Ct neck showed unchanged right sternocleidomastoid muscle metastasis. Unchanged subcentimeter left cervical lymph nodes. No evidence of new or progressive metastatic disease in the neck.    10/13/2016 Imaging    CT chest showed tiny hypervascular foci in the liver, not definitely seen on prior imaging of 06/16/2016 and 02/28/2016. Abdomen MRI without and with contrast recommended to further evaluate as metastatic disease is a concern. 2. Stable appearance of post treatment changes left upper lung and scattered tiny bilateral pulmonary nodules.    02/11/2017 Imaging    Ct neck: Lymph node mass right  posterior neck appears improved from the prior study. Small posterior lymph nodes on the left unchanged. Occluded right jugular vein unchanged.    02/11/2017 Imaging    1. Similar appearance of postsurgical and radiation changes in the left upper lobe. 2. Similar bilateral pulmonary nodules. 3. No thoracic adenopathy. 4. Subtle foci of post-contrast enhancement within the liver are suboptimally characterized on this nondedicated study. Likely similar. These could either be re-evaluated at followup or more entirely characterized with abdominal MRI. 5. Left nephrolithiasis.    05/19/2017 Imaging    Matted lymph node mass right posterior neck appears larger in the recent CT. Accurate measurements difficult due to infiltrating tumor margins and infiltration of the muscle. Right jugular vein again appears occluded or resected. Small left posterior lymph nodes stable. Left upper lobe airspace density stable and similar to the prior CT    06/03/2017 PET scan    1. Hypermetabolic ill-defined right level IIb  lymph node, about 1.3 cm in diameter with maximum SUV 9.5 (formerly 8.1). Appearance suspicious for residual/recurrent malignancy. No worrisome left-sided lesion. 2. Left suprahilar indistinct opacity demonstrates no worrisome hypermetabolic activity. The 5 mm left lower lobe pulmonary nodule is stable and not currently hypermetabolic although below sensitive PET-CT size thresholds. 3. Other imaging findings of potential clinical significance: Bilateral nonobstructive nephrolithiasis. Chronic bilateral maxillary sinusitis.    05/11/2018 PET scan    1. Continued chronic accentuated metabolic activity in the vicinity of right level IIB and the adjacent right sternocleidomastoid muscle, with ill definition of surrounding tissue planes. Maximum SUV is currently 8.1, formerly 9.5. Accentuated metabolic activity is been present in this vicinity back through 06/25/2015, and there was also some low-level activity  in this vicinity on 06/07/2014. Some of this may be from scarring and local muscular activity although clearly a component of residual tumor is difficult to exclude given the focally high activity. 2. Other imaging findings of potential clinical significance: Chronic bilateral maxillary sinusitis. Chronic scarring in the left upper lobe. Chronically stable 5 mm left lower lobe nodule is considered benign. Nonobstructive left nephrolithiasis.    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 COMMENT:No significant keratinization is identified. Other basaloid carcinomas are in the differential diagnosis. No cell block material is available for further testing.    09/12/2018 Procedure    He underwent fine Needle Aspiration    10/04/2018 PET scan    1. Significant progression of local recurrence laterally in the mid right neck with an enlarging, increasingly hypermetabolic soft tissue mass. This involves the right sternocleidomastoid muscle. 2. Small lymph nodes in the right axilla are increasingly hypermetabolic. These are nonspecific and potentially reactive, although could reflect a small metastases. Small hypermetabolic nodule in the left suprasternal notch is unchanged. 3. No other evidence of metastatic disease.     10/07/2018 -  Chemotherapy    The patient had cisplatin plus gemzar    12/07/2018 Imaging    1. Decreased size of lateral right neck mass. 2. Unchanged soft tissue nodule in the suprasternal notch. 3. No evidence of new metastatic disease in the neck.      REVIEW OF SYSTEMS:   Constitutional: Denies fevers, chills or abnormal weight loss Eyes: Denies blurriness of vision Ears, nose, mouth, throat, and face: Denies mucositis or sore throat Respiratory: Denies cough, dyspnea or wheezes Cardiovascular: Denies palpitation, chest  discomfort or lower extremity swelling Gastrointestinal:  Denies nausea, heartburn or change in bowel habits Skin: Denies abnormal skin rashes Lymphatics: Denies new lymphadenopathy or easy bruising Neurological:Denies numbness, tingling or new weaknesses Behavioral/Psych: Mood is stable, no new changes  All other systems were reviewed with the patient and are negative.  I have reviewed the past medical history, past surgical history, social history and family history with the patient and they are unchanged from previous note.  ALLERGIES:  is allergic to phenergan [promethazine hcl]; heparin; and clindamycin.  MEDICATIONS:  Current Outpatient Medications  Medication Sig Dispense Refill  . cetirizine (ZYRTEC) 10 MG tablet Take 10 mg by mouth daily.    Marland Kitchen levothyroxine (SYNTHROID, LEVOTHROID) 137 MCG tablet TAKE 1 TABLET(137 MCG) BY MOUTH DAILY BEFORE BREAKFAST (Patient taking differently: Take 137 mcg by mouth daily before breakfast. ) 90 tablet 0  . lidocaine (XYLOCAINE) 2 % solution Use as directed 5 mLs in the mouth or throat every 3 (three) hours  as needed for mouth pain. Swish, gargle and spit 100 mL 2  . LORazepam (ATIVAN) 1 MG tablet Take 1 tablet (1 mg total) by mouth every 8 (eight) hours as needed for anxiety. 30 tablet 3  . magic mouthwash SOLN Take 10 mLs by mouth 4 (four) times daily as needed for mouth pain. Swish and swallow or spit 240 mL 2  . mirtazapine (REMERON) 15 MG tablet Take 1 tablet (15 mg total) by mouth at bedtime. 30 tablet 1  . omeprazole (PRILOSEC) 20 MG capsule Take 1 capsule (20 mg total) by mouth daily. 30 capsule 5  . ondansetron (ZOFRAN) 8 MG tablet Take 1 tablet (8 mg total) by mouth every 8 (eight) hours as needed for nausea or vomiting. 90 tablet 1  . oxyCODONE (OXY IR/ROXICODONE) 5 MG immediate release tablet Take 1 tablet (5 mg total) by mouth every 8 (eight) hours as needed. 30 tablet 0  . PARoxetine (PAXIL) 20 MG tablet Take 1 tablet (20 mg total) by  mouth daily. 30 tablet 5  . polyethylene glycol (MIRALAX) packet Take 17 g by mouth daily. (Patient taking differently: Take 17 g by mouth daily as needed for mild constipation or moderate constipation. ) 14 each 3  . prochlorperazine (COMPAZINE) 10 MG tablet Take 1 tablet (10 mg total) by mouth every 12 (twelve) hours as needed (Nausea or vomiting). 30 tablet 1  . senna-docusate (SENOKOT-S) 8.6-50 MG tablet Take 2 tablets by mouth 3 (three) times daily. 90 tablet 0   No current facility-administered medications for this visit.     PHYSICAL EXAMINATION: ECOG PERFORMANCE STATUS: 1 - Symptomatic but completely ambulatory  Vitals:   02/06/19 1229  BP: 107/64  Pulse: 64  Resp: 18  Temp: 97.8 F (36.6 C)  SpO2: 100%   Filed Weights   02/06/19 1229  Weight: 197 lb 6.4 oz (89.5 kg)    GENERAL:alert, no distress and comfortable SKIN: skin color, texture, turgor are normal, no rashes or significant lesions EYES: normal, Conjunctiva are pink and non-injected, sclera clear OROPHARYNX:no exudate, no erythema and lips, buccal mucosa, and tongue normal  NECK: Noted well-healed surgical scar in his neck.  No signs of cancer recurrence LYMPH:  no palpable lymphadenopathy in the cervical, axillary or inguinal LUNGS: clear to auscultation and percussion with normal breathing effort HEART: regular rate & rhythm and no murmurs and no lower extremity edema ABDOMEN:abdomen soft, non-tender and normal bowel sounds Musculoskeletal:no cyanosis of digits and no clubbing  NEURO: alert & oriented x 3 with fluent speech, no focal motor/sensory deficits  LABORATORY DATA:  I have reviewed the data as listed    Component Value Date/Time   NA 140 12/23/2018 1230   NA 139 09/22/2017 0829   K 3.8 12/23/2018 1230   K 3.5 09/22/2017 0829   CL 103 12/23/2018 1230   CO2 25 12/23/2018 1230   CO2 26 09/22/2017 0829   GLUCOSE 169 (H) 12/23/2018 1230   GLUCOSE 133 09/22/2017 0829   BUN 16 12/23/2018 1230    BUN 14.1 09/22/2017 0829   CREATININE 0.96 12/23/2018 1230   CREATININE 0.9 09/22/2017 0829   CALCIUM 9.3 12/23/2018 1230   CALCIUM 9.1 09/22/2017 0829   PROT 7.0 12/23/2018 1230   PROT 6.8 09/22/2017 0829   ALBUMIN 4.2 12/23/2018 1230   ALBUMIN 4.1 09/22/2017 0829   AST 21 12/23/2018 1230   AST 22 09/22/2017 0829   ALT 31 12/23/2018 1230   ALT 30 09/22/2017 0829   ALKPHOS 60  12/23/2018 1230   ALKPHOS 55 09/22/2017 0829   BILITOT 0.3 12/23/2018 1230   BILITOT 0.35 09/22/2017 0829   GFRNONAA >60 12/23/2018 1230   GFRAA >60 12/23/2018 1230    No results found for: SPEP, UPEP  Lab Results  Component Value Date   WBC 6.3 02/06/2019   NEUTROABS 3.3 02/06/2019   HGB 13.3 02/06/2019   HCT 41.8 02/06/2019   MCV 95.9 02/06/2019   PLT 252 02/06/2019      Chemistry      Component Value Date/Time   NA 140 12/23/2018 1230   NA 139 09/22/2017 0829   K 3.8 12/23/2018 1230   K 3.5 09/22/2017 0829   CL 103 12/23/2018 1230   CO2 25 12/23/2018 1230   CO2 26 09/22/2017 0829   BUN 16 12/23/2018 1230   BUN 14.1 09/22/2017 0829   CREATININE 0.96 12/23/2018 1230   CREATININE 0.9 09/22/2017 0829      Component Value Date/Time   CALCIUM 9.3 12/23/2018 1230   CALCIUM 9.1 09/22/2017 0829   ALKPHOS 60 12/23/2018 1230   ALKPHOS 55 09/22/2017 0829   AST 21 12/23/2018 1230   AST 22 09/22/2017 0829   ALT 31 12/23/2018 1230   ALT 30 09/22/2017 0829   BILITOT 0.3 12/23/2018 1230   BILITOT 0.35 09/22/2017 0829       All questions were answered. The patient knows to call the clinic with any problems, questions or concerns. No barriers to learning was detected.  I spent 10 minutes counseling the patient face to face. The total time spent in the appointment was 15 minutes and more than 50% was on counseling and review of test results  [name], MD 02/06/2019 12:34 PM

## 2019-03-01 ENCOUNTER — Telehealth: Payer: Self-pay

## 2019-03-01 NOTE — Telephone Encounter (Signed)
Called and left a message requesting refill on Oxycodone and that it be sent to pharmacy.

## 2019-03-02 ENCOUNTER — Other Ambulatory Visit: Payer: Self-pay | Admitting: Hematology and Oncology

## 2019-03-02 ENCOUNTER — Encounter: Payer: Self-pay | Admitting: Hematology and Oncology

## 2019-03-02 DIAGNOSIS — E0840 Diabetes mellitus due to underlying condition with diabetic neuropathy, unspecified: Secondary | ICD-10-CM

## 2019-03-02 DIAGNOSIS — E538 Deficiency of other specified B group vitamins: Secondary | ICD-10-CM

## 2019-03-02 HISTORY — DX: Diabetes mellitus due to underlying condition with diabetic neuropathy, unspecified: E08.40

## 2019-03-02 MED ORDER — OXYCODONE HCL 5 MG PO TABS: 5.0000 mg | ORAL_TABLET | Freq: Three times a day (TID) | ORAL | 0 refills | Status: DC | PRN

## 2019-03-02 MED ORDER — METFORMIN HCL 500 MG PO TABS: 500.0000 mg | ORAL_TABLET | Freq: Every day | ORAL | 1 refills | Status: DC

## 2019-03-02 NOTE — Telephone Encounter (Signed)
done

## 2019-03-02 NOTE — Telephone Encounter (Signed)
Called and given below message. He verbalized understanding. He will call and try to get appt with PCP.

## 2019-03-02 NOTE — Telephone Encounter (Signed)
1) Time to get a PCP; I recommend [facility] 2) I will send in metformin for him to start taking daily 3) The sensation is from neuropathy; need to loose weight and cut out carbs

## 2019-03-02 NOTE — Telephone Encounter (Signed)
Called and given below message. He verbalized understanding. He is requesting appt next week. Complaining of prickly feeling in both feet with pain. At times he has a hot sensation of the left side of his face starting at his ear that will last for 40 minutes. Christian Simmons called him a few weeks ago and told him he was a diabetic, he is not sure who called and he was not told what to do about it. He does not have a PCP.

## 2019-03-22 ENCOUNTER — Telehealth: Payer: Self-pay | Admitting: *Deleted

## 2019-03-22 NOTE — Telephone Encounter (Signed)
Patient called to request refill on Oxycodone 5mg .  He reports a new inconsistent pain in his jaw line that he will discuss with Dr. Alvy Bimler on 6/29. This is different pain than he experienced with his tumor.

## 2019-03-23 ENCOUNTER — Other Ambulatory Visit: Payer: Self-pay | Admitting: Hematology and Oncology

## 2019-03-23 MED ORDER — OXYCODONE HCL 5 MG PO TABS: 5.0000 mg | ORAL_TABLET | Freq: Three times a day (TID) | ORAL | 0 refills | Status: DC | PRN

## 2019-03-23 NOTE — Telephone Encounter (Signed)
I sent to [facility]

## 2019-03-29 ENCOUNTER — Telehealth: Payer: Self-pay

## 2019-03-29 ENCOUNTER — Telehealth: Payer: Self-pay | Admitting: Hematology and Oncology

## 2019-03-29 NOTE — Telephone Encounter (Signed)
Scheduled appt per 6/24 sch message spoke with pt - they are aware of appt date and time

## 2019-03-29 NOTE — Telephone Encounter (Signed)
-----   Message from [name], MD sent at 03/29/2019  8:59 AM EDT ----- Regarding: reschedule appt His appt next Monday; can he come in a different day? We have over 20 patients that day

## 2019-03-29 NOTE — Telephone Encounter (Signed)
Called and given below message. He verbalized understanding. He would like to move appt to Thursday. Scheduling message sent.

## 2019-04-03 ENCOUNTER — Other Ambulatory Visit: Payer: Medicaid Other

## 2019-04-03 ENCOUNTER — Ambulatory Visit: Payer: Medicaid Other | Admitting: Hematology and Oncology

## 2019-04-06 ENCOUNTER — Telehealth: Payer: Self-pay | Admitting: Hematology and Oncology

## 2019-04-06 ENCOUNTER — Other Ambulatory Visit: Payer: Self-pay

## 2019-04-06 ENCOUNTER — Inpatient Hospital Stay: Payer: Medicaid Other | Attending: Hematology and Oncology | Admitting: Hematology and Oncology

## 2019-04-06 ENCOUNTER — Telehealth: Payer: Self-pay

## 2019-04-06 ENCOUNTER — Encounter: Payer: Self-pay | Admitting: Hematology and Oncology

## 2019-04-06 ENCOUNTER — Inpatient Hospital Stay: Payer: Medicaid Other

## 2019-04-06 DIAGNOSIS — E0840 Diabetes mellitus due to underlying condition with diabetic neuropathy, unspecified: Secondary | ICD-10-CM

## 2019-04-06 DIAGNOSIS — C760 Malignant neoplasm of head, face and neck: Secondary | ICD-10-CM

## 2019-04-06 DIAGNOSIS — C7800 Secondary malignant neoplasm of unspecified lung: Secondary | ICD-10-CM

## 2019-04-06 DIAGNOSIS — M542 Cervicalgia: Secondary | ICD-10-CM | POA: Diagnosis not present

## 2019-04-06 DIAGNOSIS — Z9221 Personal history of antineoplastic chemotherapy: Secondary | ICD-10-CM | POA: Diagnosis not present

## 2019-04-06 DIAGNOSIS — C119 Malignant neoplasm of nasopharynx, unspecified: Secondary | ICD-10-CM | POA: Diagnosis present

## 2019-04-06 DIAGNOSIS — E119 Type 2 diabetes mellitus without complications: Secondary | ICD-10-CM | POA: Insufficient documentation

## 2019-04-06 DIAGNOSIS — E039 Hypothyroidism, unspecified: Secondary | ICD-10-CM | POA: Insufficient documentation

## 2019-04-06 DIAGNOSIS — E538 Deficiency of other specified B group vitamins: Secondary | ICD-10-CM

## 2019-04-06 DIAGNOSIS — Z95828 Presence of other vascular implants and grafts: Secondary | ICD-10-CM

## 2019-04-06 LAB — VITAMIN B12: Vitamin B-12: 354 pg/mL (ref 180–914)

## 2019-04-06 LAB — COMPREHENSIVE METABOLIC PANEL
ALT: 35 U/L (ref 0–44)
AST: 26 U/L (ref 15–41)
Albumin: 4 g/dL (ref 3.5–5.0)
Alkaline Phosphatase: 71 U/L (ref 38–126)
Anion gap: 10 (ref 5–15)
BUN: 15 mg/dL (ref 6–20)
CO2: 24 mmol/L (ref 22–32)
Calcium: 8.8 mg/dL — ABNORMAL LOW (ref 8.9–10.3)
Chloride: 105 mmol/L (ref 98–111)
Creatinine, Ser: 0.97 mg/dL (ref 0.61–1.24)
GFR calc Af Amer: >60 mL/min (ref >60)
GFR calc non Af Amer: >60 mL/min (ref >60)
Glucose, Bld: 142 mg/dL — ABNORMAL HIGH (ref 70–99)
Potassium: 4 mmol/L (ref 3.5–5.1)
Sodium: 139 mmol/L (ref 135–145)
Total Bilirubin: 0.3 mg/dL (ref 0.3–1.2)
Total Protein: 7 g/dL (ref 6.5–8.1)

## 2019-04-06 LAB — CBC WITH DIFFERENTIAL/PLATELET
Abs Immature Granulocytes: 0.01 10*3/uL (ref 0.00–0.07)
Basophils Absolute: 0 10*3/uL (ref 0.0–0.1)
Basophils Relative: 1 %
Eosinophils Absolute: 0.1 10*3/uL (ref 0.0–0.5)
Eosinophils Relative: 2 %
HCT: 42 % (ref 39.0–52.0)
Hemoglobin: 13.8 g/dL (ref 13.0–17.0)
Immature Granulocytes: 0 %
Lymphocytes Relative: 26 %
Lymphs Abs: 1.6 10*3/uL (ref 0.7–4.0)
MCH: 29.3 pg (ref 26.0–34.0)
MCHC: 32.9 g/dL (ref 30.0–36.0)
MCV: 89.2 fL (ref 80.0–100.0)
Monocytes Absolute: 0.6 10*3/uL (ref 0.1–1.0)
Monocytes Relative: 10 %
Neutro Abs: 3.7 10*3/uL (ref 1.7–7.7)
Neutrophils Relative %: 61 %
Platelets: 205 10*3/uL (ref 150–400)
RBC: 4.71 MIL/uL (ref 4.22–5.81)
RDW: 13.5 % (ref 11.5–15.5)
WBC: 6.1 10*3/uL (ref 4.0–10.5)
nRBC: 0 % (ref 0.0–0.2)

## 2019-04-06 LAB — HEMOGLOBIN A1C
Hgb A1c MFr Bld: 6.4 % — ABNORMAL HIGH (ref 4.8–5.6)
Mean Plasma Glucose: 136.98 mg/dL

## 2019-04-06 LAB — MAGNESIUM: Magnesium: 1.8 mg/dL (ref 1.7–2.4)

## 2019-04-06 LAB — TSH: TSH: 1.789 u[IU]/mL (ref 0.320–4.118)

## 2019-04-06 MED ORDER — SODIUM CHLORIDE 0.9% FLUSH
10.0000 mL | Freq: Once | INTRAVENOUS | Status: AC
  Administered 2019-04-06: 10 mL
  Filled 2019-04-06: qty 10

## 2019-04-06 NOTE — Telephone Encounter (Signed)
-----   Message from [name], MD sent at 04/06/2019  2:13 PM EDT ----- Regarding: all labs are good except hemoglobin A1c Recommend dietary change as discussed

## 2019-04-06 NOTE — Assessment & Plan Note (Signed)
Clinically, he has no signs or symptoms to suggest cancer recurrence We will continue chemotherapy holiday I will see him back in another 8 weeks for further evaluation

## 2019-04-06 NOTE — Assessment & Plan Note (Signed)
His TSH is still pending The patient has gained a lot of weight I will call him next week once results are available and we will continue to adjust his thyroid medicine as needed

## 2019-04-06 NOTE — Telephone Encounter (Signed)
Called and given below message. He verbalized understanding.  He is requesting refill on Oxycodone and ask that it be sent to [facility] on Monday morning.

## 2019-04-06 NOTE — Progress Notes (Signed)
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  I encouraged him to continue to taper  Type 2 diabetes mellitus ([place]) He has developed type 2 diabetes He has gained a lot of weight We discussed the importance of dietary modification He will continue metformin and will follow with primary care doctor   No orders of the defined types were placed in this encounter.   INTERVAL HISTORY: Please see below for problem oriented charting. 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 Pan-endoscopy elsewhere showed cancer from nasopharyngeal space.   01/04/2007 - 02/20/2007 Chemotherapy   He received 2 cycles of cisplatin and 5FU followed by concurrent chemo with weekly cisplatin and radiation. He only received 2 doses of chemo due to severe mucositis, nausea and weight loss.   04/05/2007 - 08/04/2007 Chemotherapy   He received 4 more courses of cisplatin with 5FU and had complete response   07/05/2009 Procedure   Fine-needle aspirate of the right level II lymph nodes come from recurrent metastatic disease. Repeat endoscopy and CT scan show no evidence of disease elsewhere.   07/08/2009 - 12/02/2009 Chemotherapy   He was given 6 cycles of carboplatin, 5-FU and docetaxel   12/03/2009 Surgery   He has surgery to the residual lymph node on the right neck which showed no evidence of disease.   02/22/2012 Imaging   Repeat imaging study showed large recurrent mass. He was referred elsewhere for further treatment.   05/03/2012 Surgery   He underwent left upper lobectomy.   04/29/2013 Imaging   PEt scan showed lesion on right level II B and lower lung was abnormal   06/03/2013 - 02/02/2014 Chemotherapy   He had 6 cycles of chemotherapy when he was found to have recurrence of cancer and had received oxaliplatin and capecitabine   06/07/2014 Imaging  PET CT scan showed persistent disease in the right neck lymph nodes and left lung   06/29/2014 Procedure   Accession: [id number] repeat LUL biopsy confirmed metastatic cancer   07/18/2014 - 07/31/2014 Radiation Therapy   He received palliative radiation therapy to the lungs   10/10/2014 Imaging   CT scan of the chest, abdomen and pelvis show regression in the size of  the lung nodule in the left upper lobe and stable pulmonary nodules   01/24/2015 Imaging   CT scan showed stable disease in neck and lung   06/19/2015 Imaging   CT scan of the neck and the chest show possible mild progression of the nodule in the right side of the neck.   06/25/2015 Imaging   PET scan confirmed disease recurrence in the neck   07/07/2015 Imaging   He had [facility] neck [facility]   09/03/2015 - 08/26/2018 Chemotherapy   He received palliative chemo with Nivolumab   10/29/2015 Imaging   PET CT showed positive response to Rx   02/28/2016 Imaging   Ct abdomen showed abnormal thinkening in his stomach   03/03/2016 Imaging   CT: Right sternocleidomastoid muscle metastasis appears less distinct but otherwise not significantly changed in size or configuration since 06/19/2015.2. Left level 3 lymph node which was hypermetabolic by PET-CT in January 2017 appears slightly smaller   04/01/2016 Imaging   CT cervical spine showed no acute fracture or traumatic malalignment in the cervical spine   04/22/2016 Procedure   Port-a-cath placed.   06/16/2016 Imaging   Ct neck showed right sternocleidomastoid muscle metastasis is further decreased in conspicuity since May, and has mildly decreased in size since September 2016. Continued stability of sub-centimeter left cervical lymph nodes. No new or progressive metastatic disease in the neck.   06/16/2016 Imaging   CT chest showed stable masslike radiation fibrosis in the left upper lobe. Stable subcentimeter pulmonary nodules in the bilateral lower lobes. No new or progressive metastatic disease in the chest. Nonobstructing left renal stone.   10/13/2016 Imaging   Ct neck showed unchanged right sternocleidomastoid muscle metastasis. Unchanged subcentimeter left cervical lymph nodes. No evidence of new or progressive metastatic disease in the neck.   10/13/2016 Imaging   CT chest showed tiny hypervascular foci in the liver, not definitely seen on  prior imaging of 06/16/2016 and 02/28/2016. Abdomen MRI without and with contrast recommended to further evaluate as metastatic disease is a concern. 2. Stable appearance of post treatment changes left upper lung and scattered tiny bilateral pulmonary nodules.   02/11/2017 Imaging   Ct neck: Lymph node mass right posterior neck appears improved from the prior study. Small posterior lymph nodes on the left unchanged. Occluded right jugular vein unchanged.   02/11/2017 Imaging   1. Similar appearance of postsurgical and radiation changes in the left upper lobe. 2. Similar bilateral pulmonary nodules. 3. No thoracic adenopathy. 4. Subtle foci of post-contrast enhancement within the liver are suboptimally characterized on this nondedicated study. Likely similar. These could either be re-evaluated at followup or more entirely characterized with abdominal MRI. 5. Left nephrolithiasis.   05/19/2017 Imaging   Matted lymph node mass right posterior neck appears larger in the recent CT. Accurate measurements difficult due to infiltrating tumor margins and infiltration of the muscle. Right jugular vein again appears occluded or resected. Small left posterior lymph nodes stable. Left upper lobe airspace density stable and similar to the prior CT   06/03/2017 PET scan   1. Hypermetabolic ill-defined right level IIb  lymph node, about 1.3 cm in diameter with maximum SUV 9.5 (formerly 8.1). Appearance suspicious for residual/recurrent malignancy. No worrisome left-sided lesion. 2. Left suprahilar indistinct opacity demonstrates no worrisome hypermetabolic activity. The 5 mm left lower lobe pulmonary nodule is stable and not currently hypermetabolic although below sensitive PET-CT size thresholds. 3. Other imaging findings of potential clinical significance: Bilateral nonobstructive nephrolithiasis. Chronic bilateral maxillary sinusitis.   05/11/2018 PET scan   1. Continued chronic accentuated metabolic activity in  the vicinity of right level IIB and the adjacent right sternocleidomastoid muscle, with ill definition of surrounding tissue planes. Maximum SUV is currently 8.1, formerly 9.5. Accentuated metabolic activity is been present in this vicinity back through 06/25/2015, and there was also some low-level activity in this vicinity on 06/07/2014. Some of this may be from scarring and local muscular activity although clearly a component of residual tumor is difficult to exclude given the focally high activity. 2. Other imaging findings of potential clinical significance: Chronic bilateral maxillary sinusitis. Chronic scarring in the left upper lobe. Chronically stable 5 mm left lower lobe nodule is considered benign. Nonobstructive left nephrolithiasis.   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 COMMENT:No significant keratinization is identified. Other basaloid carcinomas are in the differential diagnosis. No cell block material is available for further testing.   09/12/2018 Procedure   He underwent fine Needle Aspiration   10/04/2018 PET scan   1. Significant progression of local recurrence laterally in the mid right neck with an enlarging, increasingly hypermetabolic soft tissue mass. This involves the right sternocleidomastoid muscle. 2. Small lymph nodes in the right axilla are increasingly hypermetabolic. These are nonspecific and potentially reactive, although could reflect a small metastases. Small hypermetabolic nodule in the left suprasternal notch is unchanged. 3. No other evidence of metastatic disease.    10/07/2018 -  Chemotherapy   The patient had cisplatin plus gemzar   12/07/2018 Imaging   1. Decreased size of lateral right neck mass. 2. Unchanged soft tissue nodule in the suprasternal notch. 3. No evidence of new metastatic disease in the  neck.      REVIEW OF SYSTEMS:   Constitutional: Denies fevers, chills or abnormal weight loss Eyes: Denies blurriness of vision Ears, nose, mouth, throat, and face: Denies mucositis or sore throat Respiratory: Denies cough, dyspnea or wheezes Cardiovascular: Denies palpitation, chest discomfort or lower extremity swelling Gastrointestinal:  Denies nausea, heartburn or change in bowel habits Skin: Denies abnormal skin rashes Lymphatics: Denies new lymphadenopathy or easy bruising Neurological:Denies numbness, tingling or new weaknesses Behavioral/Psych: Mood is stable, no new changes  All other systems were reviewed with the patient and are negative.  I have reviewed the past medical history, past surgical history, social history and family history with the patient and they are unchanged from previous note.  ALLERGIES:  is allergic to phenergan [promethazine hcl]; heparin; and clindamycin.  MEDICATIONS:  Current Outpatient Medications  Medication Sig Dispense Refill  . cetirizine (ZYRTEC) 10 MG tablet Take 10 mg by mouth daily.    Marland Kitchen levothyroxine (SYNTHROID, LEVOTHROID) 137 MCG tablet TAKE 1 TABLET(137 MCG) BY MOUTH DAILY BEFORE BREAKFAST (Patient taking differently: Take 137 mcg by mouth daily before breakfast. ) 90 tablet 0  . lidocaine (XYLOCAINE) 2 % solution Use as directed 5 mLs in the mouth or throat every 3 (three) hours as needed for mouth pain. Swish, gargle and spit 100 mL 2  .  LORazepam (ATIVAN) 1 MG tablet Take 1 tablet (1 mg total) by mouth every 8 (eight) hours as needed for anxiety. 30 tablet 3  . magic mouthwash SOLN Take 10 mLs by mouth 4 (four) times daily as needed for mouth pain. Swish and swallow or spit 240 mL 2  . metFORMIN (GLUCOPHAGE) 500 MG tablet TAKE 1 TABLET(500 MG) BY MOUTH DAILY WITH BREAKFAST 90 tablet 1  . mirtazapine (REMERON) 15 MG tablet Take 1 tablet (15 mg total) by mouth at bedtime. 30 tablet 1  . omeprazole (PRILOSEC) 20 MG capsule Take 1  capsule (20 mg total) by mouth daily. 30 capsule 5  . ondansetron (ZOFRAN) 8 MG tablet Take 1 tablet (8 mg total) by mouth every 8 (eight) hours as needed for nausea or vomiting. 90 tablet 1  . oxyCODONE (OXY IR/ROXICODONE) 5 MG immediate release tablet Take 1 tablet (5 mg total) by mouth every 8 (eight) hours as needed. 30 tablet 0  . PARoxetine (PAXIL) 20 MG tablet Take 1 tablet (20 mg total) by mouth daily. 30 tablet 5  . polyethylene glycol (MIRALAX) packet Take 17 g by mouth daily. (Patient taking differently: Take 17 g by mouth daily as needed for mild constipation or moderate constipation. ) 14 each 3  . prochlorperazine (COMPAZINE) 10 MG tablet Take 1 tablet (10 mg total) by mouth every 12 (twelve) hours as needed (Nausea or vomiting). 30 tablet 1  . senna-docusate (SENOKOT-S) 8.6-50 MG tablet Take 2 tablets by mouth 3 (three) times daily. 90 tablet 0   No current facility-administered medications for this visit.     PHYSICAL EXAMINATION: ECOG PERFORMANCE STATUS: 1 - Symptomatic but completely ambulatory  Vitals:   04/06/19 1203  BP: 115/71  Pulse: 87  Resp: 16  Temp: 97.8 F (36.6 C)  SpO2: 100%   Filed Weights   04/06/19 1203  Weight: 202 lb 12.8 oz (92 kg)    GENERAL:alert, no distress and comfortable SKIN: skin color, texture, turgor are normal, no rashes or significant lesions EYES: normal, Conjunctiva are pink and non-injected, sclera clear OROPHARYNX:no exudate, no erythema and lips, buccal mucosa, and tongue normal  NECK: Well-healed surgical scar on the right side of his neck with fibrosis LYMPH:  no palpable lymphadenopathy in the cervical, axillary or inguinal LUNGS: clear to auscultation and percussion with normal breathing effort HEART: regular rate & rhythm and no murmurs and no lower extremity edema ABDOMEN:abdomen soft, non-tender and normal bowel sounds Musculoskeletal:no cyanosis of digits and no clubbing  NEURO: alert & oriented x 3 with fluent speech,  no focal motor/sensory deficits  LABORATORY DATA:  I have reviewed the data as listed    Component Value Date/Time   NA 139 04/06/2019 1145   NA 139 09/22/2017 0829   K 4.0 04/06/2019 1145   K 3.5 09/22/2017 0829   CL 105 04/06/2019 1145   CO2 24 04/06/2019 1145   CO2 26 09/22/2017 0829   GLUCOSE 142 (H) 04/06/2019 1145   GLUCOSE 133 09/22/2017 0829   BUN 15 04/06/2019 1145   BUN 14.1 09/22/2017 0829   CREATININE 0.97 04/06/2019 1145   CREATININE 0.93 02/06/2019 1215   CREATININE 0.9 09/22/2017 0829   CALCIUM 8.8 (L) 04/06/2019 1145   CALCIUM 9.1 09/22/2017 0829   PROT 7.0 04/06/2019 1145   PROT 6.8 09/22/2017 0829   ALBUMIN 4.0 04/06/2019 1145   ALBUMIN 4.1 09/22/2017 0829   AST 26 04/06/2019 1145   AST 31 02/06/2019 1215   AST 22 09/22/2017  0829   ALT 35 04/06/2019 1145   ALT 48 (H) 02/06/2019 1215   ALT 30 09/22/2017 0829   ALKPHOS 71 04/06/2019 1145   ALKPHOS 55 09/22/2017 0829   BILITOT 0.3 04/06/2019 1145   BILITOT 0.2 (L) 02/06/2019 1215   BILITOT 0.35 09/22/2017 0829   GFRNONAA >60 04/06/2019 1145   GFRNONAA >60 02/06/2019 1215   GFRAA >60 04/06/2019 1145   GFRAA >60 02/06/2019 1215    No results found for: SPEP, UPEP  Lab Results  Component Value Date   WBC 6.1 04/06/2019   NEUTROABS 3.7 04/06/2019   HGB 13.8 04/06/2019   HCT 42.0 04/06/2019   MCV 89.2 04/06/2019   PLT 205 04/06/2019      Chemistry      Component Value Date/Time   NA 139 04/06/2019 1145   NA 139 09/22/2017 0829   K 4.0 04/06/2019 1145   K 3.5 09/22/2017 0829   CL 105 04/06/2019 1145   CO2 24 04/06/2019 1145   CO2 26 09/22/2017 0829   BUN 15 04/06/2019 1145   BUN 14.1 09/22/2017 0829   CREATININE 0.97 04/06/2019 1145   CREATININE 0.93 02/06/2019 1215   CREATININE 0.9 09/22/2017 0829      Component Value Date/Time   CALCIUM 8.8 (L) 04/06/2019 1145   CALCIUM 9.1 09/22/2017 0829   ALKPHOS 71 04/06/2019 1145   ALKPHOS 55 09/22/2017 0829   AST 26 04/06/2019 1145   AST 31  02/06/2019 1215   AST 22 09/22/2017 0829   ALT 35 04/06/2019 1145   ALT 48 (H) 02/06/2019 1215   ALT 30 09/22/2017 0829   BILITOT 0.3 04/06/2019 1145   BILITOT 0.2 (L) 02/06/2019 1215   BILITOT 0.35 09/22/2017 0829      All questions were answered. The patient knows to call the clinic with any problems, questions or concerns. No barriers to learning was detected.  I spent 15 minutes counseling the patient face to face. The total time spent in the appointment was 20 minutes and more than 50% was on counseling and review of test results  [name], MD 04/06/2019 12:59 PM

## 2019-04-06 NOTE — Assessment & Plan Note (Signed)
He is attempting to wean himself off oxycodone.  I encouraged him to continue to taper

## 2019-04-06 NOTE — Telephone Encounter (Signed)
He called and left a message. He had a crazy night and just woke up. He is leaving the house now for appt. He is sorry that he is running late.

## 2019-04-06 NOTE — Telephone Encounter (Signed)
I talk with patient regarding schedule  

## 2019-04-06 NOTE — Assessment & Plan Note (Signed)
He has developed type 2 diabetes He has gained a lot of weight We discussed the importance of dietary modification He will continue metformin and will follow with primary care doctor

## 2019-04-10 ENCOUNTER — Other Ambulatory Visit: Payer: Self-pay | Admitting: Hematology and Oncology

## 2019-04-10 MED ORDER — OXYCODONE HCL 5 MG PO TABS: 5.0000 mg | ORAL_TABLET | Freq: Three times a day (TID) | ORAL | 0 refills | Status: DC | PRN

## 2019-04-10 NOTE — Telephone Encounter (Signed)
done

## 2019-04-25 ENCOUNTER — Telehealth: Payer: Self-pay

## 2019-04-25 NOTE — Telephone Encounter (Signed)
Pt called wanting to know if it is ok for him to have dental cleaning and tooth extraction.

## 2019-04-25 NOTE — Telephone Encounter (Signed)
He is currently off treatment so I will defer to his dentist. If he needs tooth extraction, then he can proceed

## 2019-04-25 NOTE — Telephone Encounter (Signed)
Spoke with pt by phone and gave below message.

## 2019-05-11 ENCOUNTER — Telehealth: Payer: Self-pay | Admitting: *Deleted

## 2019-05-11 NOTE — Telephone Encounter (Signed)
Patient calling for a refill on the oxycodone please to [facility].

## 2019-05-12 ENCOUNTER — Other Ambulatory Visit: Payer: Self-pay | Admitting: Hematology and Oncology

## 2019-05-12 MED ORDER — OXYCODONE HCL 5 MG PO TABS: 5.0000 mg | ORAL_TABLET | Freq: Three times a day (TID) | ORAL | 0 refills | Status: DC | PRN

## 2019-05-23 ENCOUNTER — Other Ambulatory Visit: Payer: Self-pay

## 2019-05-23 ENCOUNTER — Other Ambulatory Visit: Payer: Medicaid Other

## 2019-05-23 ENCOUNTER — Inpatient Hospital Stay: Payer: Medicaid Other

## 2019-05-23 ENCOUNTER — Ambulatory Visit: Payer: Medicaid Other | Admitting: Hematology and Oncology

## 2019-05-23 ENCOUNTER — Inpatient Hospital Stay: Payer: Medicaid Other | Attending: Hematology and Oncology

## 2019-05-23 ENCOUNTER — Inpatient Hospital Stay (HOSPITAL_BASED_OUTPATIENT_CLINIC_OR_DEPARTMENT_OTHER): Payer: Medicaid Other | Admitting: Hematology and Oncology

## 2019-05-23 VITALS — BP 113/68 | HR 85 | Temp 98.2°F | Resp 18 | Ht 69.0 in | Wt 200.0 lb

## 2019-05-23 DIAGNOSIS — Z79899 Other long term (current) drug therapy: Secondary | ICD-10-CM | POA: Insufficient documentation

## 2019-05-23 DIAGNOSIS — C760 Malignant neoplasm of head, face and neck: Secondary | ICD-10-CM

## 2019-05-23 DIAGNOSIS — G893 Neoplasm related pain (acute) (chronic): Secondary | ICD-10-CM | POA: Insufficient documentation

## 2019-05-23 DIAGNOSIS — J984 Other disorders of lung: Secondary | ICD-10-CM | POA: Insufficient documentation

## 2019-05-23 DIAGNOSIS — Z452 Encounter for adjustment and management of vascular access device: Secondary | ICD-10-CM | POA: Insufficient documentation

## 2019-05-23 DIAGNOSIS — C7800 Secondary malignant neoplasm of unspecified lung: Secondary | ICD-10-CM

## 2019-05-23 DIAGNOSIS — Z7984 Long term (current) use of oral hypoglycemic drugs: Secondary | ICD-10-CM | POA: Insufficient documentation

## 2019-05-23 DIAGNOSIS — Z9221 Personal history of antineoplastic chemotherapy: Secondary | ICD-10-CM | POA: Insufficient documentation

## 2019-05-23 DIAGNOSIS — C119 Malignant neoplasm of nasopharynx, unspecified: Secondary | ICD-10-CM | POA: Insufficient documentation

## 2019-05-23 DIAGNOSIS — Z923 Personal history of irradiation: Secondary | ICD-10-CM | POA: Insufficient documentation

## 2019-05-23 DIAGNOSIS — E039 Hypothyroidism, unspecified: Secondary | ICD-10-CM | POA: Insufficient documentation

## 2019-05-23 DIAGNOSIS — Z95828 Presence of other vascular implants and grafts: Secondary | ICD-10-CM

## 2019-05-23 LAB — CBC WITH DIFFERENTIAL/PLATELET
Abs Immature Granulocytes: 0.01 10*3/uL (ref 0.00–0.07)
Basophils Absolute: 0 10*3/uL (ref 0.0–0.1)
Basophils Relative: 0 %
Eosinophils Absolute: 0.1 10*3/uL (ref 0.0–0.5)
Eosinophils Relative: 2 %
HCT: 41.9 % (ref 39.0–52.0)
Hemoglobin: 13.7 g/dL (ref 13.0–17.0)
Immature Granulocytes: 0 %
Lymphocytes Relative: 26 %
Lymphs Abs: 1.4 10*3/uL (ref 0.7–4.0)
MCH: 28.7 pg (ref 26.0–34.0)
MCHC: 32.7 g/dL (ref 30.0–36.0)
MCV: 87.7 fL (ref 80.0–100.0)
Monocytes Absolute: 0.6 10*3/uL (ref 0.1–1.0)
Monocytes Relative: 10 %
Neutro Abs: 3.4 10*3/uL (ref 1.7–7.7)
Neutrophils Relative %: 62 %
Platelets: 205 10*3/uL (ref 150–400)
RBC: 4.78 MIL/uL (ref 4.22–5.81)
RDW: 14.6 % (ref 11.5–15.5)
WBC: 5.5 10*3/uL (ref 4.0–10.5)
nRBC: 0 % (ref 0.0–0.2)

## 2019-05-23 LAB — COMPREHENSIVE METABOLIC PANEL
ALT: 32 U/L (ref 0–44)
AST: 24 U/L (ref 15–41)
Albumin: 4 g/dL (ref 3.5–5.0)
Alkaline Phosphatase: 74 U/L (ref 38–126)
Anion gap: 11 (ref 5–15)
BUN: 13 mg/dL (ref 6–20)
CO2: 24 mmol/L (ref 22–32)
Calcium: 9.1 mg/dL (ref 8.9–10.3)
Chloride: 105 mmol/L (ref 98–111)
Creatinine, Ser: 0.9 mg/dL (ref 0.61–1.24)
GFR calc Af Amer: >60 mL/min (ref >60)
GFR calc non Af Amer: >60 mL/min (ref >60)
Glucose, Bld: 121 mg/dL — ABNORMAL HIGH (ref 70–99)
Potassium: 3.8 mmol/L (ref 3.5–5.1)
Sodium: 140 mmol/L (ref 135–145)
Total Bilirubin: 0.3 mg/dL (ref 0.3–1.2)
Total Protein: 6.8 g/dL (ref 6.5–8.1)

## 2019-05-23 LAB — TSH: TSH: 4.902 u[IU]/mL — ABNORMAL HIGH (ref 0.320–4.118)

## 2019-05-23 LAB — MAGNESIUM: Magnesium: 1.9 mg/dL (ref 1.7–2.4)

## 2019-05-23 MED ORDER — SODIUM CHLORIDE 0.9% FLUSH
10.0000 mL | Freq: Once | INTRAVENOUS | Status: AC
  Administered 2019-05-23: 13:00:00 10 mL
  Filled 2019-05-23: qty 10

## 2019-05-23 NOTE — Patient Instructions (Signed)

## 2019-05-24 ENCOUNTER — Encounter: Payer: Self-pay | Admitting: Hematology and Oncology

## 2019-05-24 ENCOUNTER — Telehealth: Payer: Self-pay | Admitting: Hematology and Oncology

## 2019-05-24 NOTE — Assessment & Plan Note (Signed)
He has slight worsening pain recently In addition to CT imaging He will continue oxycodone for now

## 2019-05-24 NOTE — Assessment & Plan Note (Signed)
Clinically, I cannot appreciate any changes to the abnormal skin thickening on the right side of his neck However, the patient perceived worsening pain The patient does have high risk of cancer recurrence due to incomplete treatment a few months ago I recommend CT imaging of the neck for further assessment and to rule out cancer recurrence In the meantime, he will continue pain medicine for pain control

## 2019-05-24 NOTE — Assessment & Plan Note (Signed)
His TSH is mildly elevated but overall stable I will continue his current thyroid medicine for now

## 2019-05-24 NOTE — Telephone Encounter (Signed)
I talk with patient regarding 8/26

## 2019-05-24 NOTE — Progress Notes (Signed)
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[place]) Clinically, I cannot appreciate any changes to the abnormal skin thickening on the right side of his neck However, the patient perceived worsening pain The patient does have high risk of cancer recurrence due to incomplete treatment a few months ago I recommend CT imaging of the neck for further assessment and to rule out cancer recurrence In the meantime, he will continue pain medicine for pain control  Cancer associated pain He has slight worsening pain recently In addition to CT imaging He will continue oxycodone for now  Acquired hypothyroidism His TSH is mildly elevated but overall stable I will continue his current thyroid medicine for now   Orders Placed This Encounter  Procedures  . CT Soft Tissue Neck W Contrast    Standing Status:   Future    Standing Expiration Date:   05/22/2020    Order Specific Question:   If indicated for the ordered procedure, I authorize the administration of contrast media per Radiology protocol    Answer:   Yes    Order Specific Question:   Preferred imaging location?    Answer:   [facility]    Order Specific Question:   Radiology Contrast Protocol - do NOT remove file path    Answer:   [email or web address]    INTERVAL HISTORY: Please see below for problem oriented charting. 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 Pan-endoscopy elsewhere showed cancer from nasopharyngeal space.   01/04/2007 - 02/20/2007 Chemotherapy   He received 2 cycles of cisplatin and 5FU followed by concurrent chemo with weekly cisplatin and radiation. He only received 2 doses of chemo due to severe mucositis, nausea and weight loss.   04/05/2007 - 08/04/2007 Chemotherapy   He received 4 more courses of cisplatin with 5FU and had complete response   07/05/2009 Procedure   Fine-needle aspirate of the right level II lymph nodes come from recurrent metastatic disease. Repeat endoscopy and CT scan show no evidence of disease elsewhere.   07/08/2009 - 12/02/2009 Chemotherapy   He was given 6 cycles of carboplatin, 5-FU and docetaxel   12/03/2009 Surgery   He has surgery to the residual lymph node on the right neck which showed no evidence of disease.   02/22/2012 Imaging   Repeat imaging study showed large recurrent mass. He was referred elsewhere for further  treatment.   05/03/2012 Surgery   He underwent left upper lobectomy.   04/29/2013 Imaging   PEt scan showed lesion on right level II B and lower lung was abnormal   06/03/2013 - 02/02/2014 Chemotherapy   He had 6 cycles of chemotherapy when he was found to have recurrence of cancer and had received oxaliplatin and capecitabine   06/07/2014 Imaging   PET CT scan  showed persistent disease in the right neck lymph nodes and left lung   06/29/2014 Procedure   Accession: [id number] repeat LUL biopsy confirmed metastatic cancer   07/18/2014 - 07/31/2014 Radiation Therapy   He received palliative radiation therapy to the lungs   10/10/2014 Imaging   CT scan of the chest, abdomen and pelvis show regression in the size of the lung nodule in the left upper lobe and stable pulmonary nodules   01/24/2015 Imaging   CT scan showed stable disease in neck and lung   06/19/2015 Imaging   CT scan of the neck and the chest show possible mild progression of the nodule in the right side of the neck.   06/25/2015 Imaging   PET scan confirmed disease recurrence in the neck   07/07/2015 Imaging   He had MRI neck [facility]   09/03/2015 - 08/26/2018 Chemotherapy   He received palliative chemo with Nivolumab   10/29/2015 Imaging   PET CT showed positive response to Rx   02/28/2016 Imaging   Ct abdomen showed abnormal thinkening in his stomach   03/03/2016 Imaging   CT: Right sternocleidomastoid muscle metastasis appears less distinct but otherwise not significantly changed in size or configuration since 06/19/2015.2. Left level 3 lymph node which was hypermetabolic by PET-CT in January 2017 appears slightly smaller   04/01/2016 Imaging   CT cervical spine showed no acute fracture or traumatic malalignment in the cervical spine   04/22/2016 Procedure   Port-a-cath placed.   06/16/2016 Imaging   Ct neck showed right sternocleidomastoid muscle metastasis is further decreased in conspicuity since May, and has mildly decreased in size since September 2016. Continued stability of sub-centimeter left cervical lymph nodes. No new or progressive metastatic disease in the neck.   06/16/2016 Imaging   CT chest showed stable masslike radiation fibrosis in the left upper lobe. Stable subcentimeter pulmonary nodules in the bilateral lower lobes. No new or progressive metastatic disease  in the chest. Nonobstructing left renal stone.   10/13/2016 Imaging   Ct neck showed unchanged right sternocleidomastoid muscle metastasis. Unchanged subcentimeter left cervical lymph nodes. No evidence of new or progressive metastatic disease in the neck.   10/13/2016 Imaging   CT chest showed tiny hypervascular foci in the liver, not definitely seen on prior imaging of 06/16/2016 and 02/28/2016. Abdomen MRI without and with contrast recommended to further evaluate as metastatic disease is a concern. 2. Stable appearance of post treatment changes left upper lung and scattered tiny bilateral pulmonary nodules.   02/11/2017 Imaging   Ct neck: Lymph node mass right posterior neck appears improved from the prior study. Small posterior lymph nodes on the left unchanged. Occluded right jugular vein unchanged.   02/11/2017 Imaging   1. Similar appearance of postsurgical and radiation changes in the left upper lobe. 2. Similar bilateral pulmonary nodules. 3. No thoracic adenopathy. 4. Subtle foci of post-contrast enhancement within the liver are suboptimally characterized on this nondedicated study. Likely similar. These could either be re-evaluated at followup or more entirely characterized with abdominal MRI. 5. Left nephrolithiasis.   05/19/2017 Imaging  Matted lymph node mass right posterior neck appears larger in the recent CT. Accurate measurements difficult due to infiltrating tumor margins and infiltration of the muscle. Right jugular vein again appears occluded or resected. Small left posterior lymph nodes stable. Left upper lobe airspace density stable and similar to the prior CT   06/03/2017 PET scan   1. Hypermetabolic ill-defined right level IIb lymph node, about 1.3 cm in diameter with maximum SUV 9.5 (formerly 8.1). Appearance suspicious for residual/recurrent malignancy. No worrisome left-sided lesion. 2. Left suprahilar indistinct opacity demonstrates no worrisome hypermetabolic activity.  The 5 mm left lower lobe pulmonary nodule is stable and not currently hypermetabolic although below sensitive PET-CT size thresholds. 3. Other imaging findings of potential clinical significance: Bilateral nonobstructive nephrolithiasis. Chronic bilateral maxillary sinusitis.   05/11/2018 PET scan   1. Continued chronic accentuated metabolic activity in the vicinity of right level IIB and the adjacent right sternocleidomastoid muscle, with ill definition of surrounding tissue planes. Maximum SUV is currently 8.1, formerly 9.5. Accentuated metabolic activity is been present in this vicinity back through 06/25/2015, and there was also some low-level activity in this vicinity on 06/07/2014. Some of this may be from scarring and local muscular activity although clearly a component of residual tumor is difficult to exclude given the focally high activity. 2. Other imaging findings of potential clinical significance: Chronic bilateral maxillary sinusitis. Chronic scarring in the left upper lobe. Chronically stable 5 mm left lower lobe nodule is considered benign. Nonobstructive left nephrolithiasis.   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 COMMENT:No significant keratinization is identified. Other basaloid carcinomas are in the differential diagnosis. No cell block material is available for further testing.   09/12/2018 Procedure   He underwent fine Needle Aspiration   10/04/2018 PET scan   1. Significant progression of local recurrence laterally in the mid right neck with an enlarging, increasingly hypermetabolic soft tissue mass. This involves the right sternocleidomastoid muscle. 2. Small lymph nodes in the right axilla are increasingly hypermetabolic. These are nonspecific and potentially reactive, although could reflect a small metastases. Small  hypermetabolic nodule in the left suprasternal notch is unchanged. 3. No other evidence of metastatic disease.    10/07/2018 -  Chemotherapy   The patient had cisplatin plus gemzar   12/07/2018 Imaging   1. Decreased size of lateral right neck mass. 2. Unchanged soft tissue nodule in the suprasternal notch. 3. No evidence of new metastatic disease in the neck.      REVIEW OF SYSTEMS:   Constitutional: Denies fevers, chills or abnormal weight loss Eyes: Denies blurriness of vision Ears, nose, mouth, throat, and face: Denies mucositis or sore throat Respiratory: Denies cough, dyspnea or wheezes Cardiovascular: Denies palpitation, chest discomfort or lower extremity swelling Gastrointestinal:  Denies nausea, heartburn or change in bowel habits Skin: Denies abnormal skin rashes Lymphatics: Denies new lymphadenopathy or easy bruising Neurological:Denies numbness, tingling or new weaknesses Behavioral/Psych: Mood is stable, no new changes  All other systems were reviewed with the patient and are negative.  I have reviewed the past medical history, past surgical history, social history and family history with the patient and they are unchanged from previous note.  ALLERGIES:  is allergic to phenergan [promethazine hcl]; heparin; and clindamycin.  MEDICATIONS:  Current Outpatient Medications  Medication Sig Dispense Refill  . cetirizine (ZYRTEC) 10 MG tablet Take 10 mg by mouth daily.    Marland Kitchen  levothyroxine (SYNTHROID, LEVOTHROID) 137 MCG tablet TAKE 1 TABLET(137 MCG) BY MOUTH DAILY BEFORE BREAKFAST (Patient taking differently: Take 137 mcg by mouth daily before breakfast. ) 90 tablet 0  . lidocaine (XYLOCAINE) 2 % solution Use as directed 5 mLs in the mouth or throat every 3 (three) hours as needed for mouth pain. Swish, gargle and spit 100 mL 2  . LORazepam (ATIVAN) 1 MG tablet Take 1 tablet (1 mg total) by mouth every 8 (eight) hours as needed for anxiety. 30 tablet 3  . magic mouthwash  SOLN Take 10 mLs by mouth 4 (four) times daily as needed for mouth pain. Swish and swallow or spit 240 mL 2  . metFORMIN (GLUCOPHAGE) 500 MG tablet TAKE 1 TABLET(500 MG) BY MOUTH DAILY WITH BREAKFAST 90 tablet 1  . mirtazapine (REMERON) 15 MG tablet Take 1 tablet (15 mg total) by mouth at bedtime. 30 tablet 1  . omeprazole (PRILOSEC) 20 MG capsule Take 1 capsule (20 mg total) by mouth daily. 30 capsule 5  . ondansetron (ZOFRAN) 8 MG tablet Take 1 tablet (8 mg total) by mouth every 8 (eight) hours as needed for nausea or vomiting. 90 tablet 1  . oxyCODONE (OXY IR/ROXICODONE) 5 MG immediate release tablet Take 1 tablet (5 mg total) by mouth every 8 (eight) hours as needed. 60 tablet 0  . PARoxetine (PAXIL) 20 MG tablet Take 1 tablet (20 mg total) by mouth daily. 30 tablet 5  . polyethylene glycol (MIRALAX) packet Take 17 g by mouth daily. (Patient taking differently: Take 17 g by mouth daily as needed for mild constipation or moderate constipation. ) 14 each 3  . prochlorperazine (COMPAZINE) 10 MG tablet Take 1 tablet (10 mg total) by mouth every 12 (twelve) hours as needed (Nausea or vomiting). 30 tablet 1  . senna-docusate (SENOKOT-S) 8.6-50 MG tablet Take 2 tablets by mouth 3 (three) times daily. 90 tablet 0   No current facility-administered medications for this visit.     PHYSICAL EXAMINATION: ECOG PERFORMANCE STATUS: 1 - Symptomatic but completely ambulatory  Vitals:   05/23/19 1204  BP: 113/68  Pulse: 85  Resp: 18  Temp: 98.2 F (36.8 C)  SpO2: 100%   Filed Weights   05/23/19 1204  Weight: 200 lb (90.7 kg)    GENERAL:alert, no distress and comfortable SKIN: skin color, texture, turgor are normal, no rashes or significant lesions EYES: normal, Conjunctiva are pink and non-injected, sclera clear OROPHARYNX:no exudate, no erythema and lips, buccal mucosa, and tongue normal  NECK: He has limited neck movement due to prior surgery and radiation causing fibrosis He had discolored  skin lesion over the posterior aspect of the sternocleidomastoid but overall unchanged compared to previous exam LYMPH:  no palpable lymphadenopathy in the cervical, axillary or inguinal LUNGS: clear to auscultation and percussion with normal breathing effort HEART: regular rate & rhythm and no murmurs and no lower extremity edema ABDOMEN:abdomen soft, non-tender and normal bowel sounds Musculoskeletal:no cyanosis of digits and no clubbing  NEURO: alert & oriented x 3 with fluent speech, no focal motor/sensory deficits  LABORATORY DATA:  I have reviewed the data as listed    Component Value Date/Time   NA 140 05/23/2019 1202   NA 139 09/22/2017 0829   K 3.8 05/23/2019 1202   K 3.5 09/22/2017 0829   CL 105 05/23/2019 1202   CO2 24 05/23/2019 1202   CO2 26 09/22/2017 0829   GLUCOSE 121 (H) 05/23/2019 1202   GLUCOSE 133 09/22/2017 0829  BUN 13 05/23/2019 1202   BUN 14.1 09/22/2017 0829   CREATININE 0.90 05/23/2019 1202   CREATININE 0.93 02/06/2019 1215   CREATININE 0.9 09/22/2017 0829   CALCIUM 9.1 05/23/2019 1202   CALCIUM 9.1 09/22/2017 0829   PROT 6.8 05/23/2019 1202   PROT 6.8 09/22/2017 0829   ALBUMIN 4.0 05/23/2019 1202   ALBUMIN 4.1 09/22/2017 0829   AST 24 05/23/2019 1202   AST 31 02/06/2019 1215   AST 22 09/22/2017 0829   ALT 32 05/23/2019 1202   ALT 48 (H) 02/06/2019 1215   ALT 30 09/22/2017 0829   ALKPHOS 74 05/23/2019 1202   ALKPHOS 55 09/22/2017 0829   BILITOT 0.3 05/23/2019 1202   BILITOT 0.2 (L) 02/06/2019 1215   BILITOT 0.35 09/22/2017 0829   GFRNONAA >60 05/23/2019 1202   GFRNONAA >60 02/06/2019 1215   GFRAA >60 05/23/2019 1202   GFRAA >60 02/06/2019 1215    No results found for: SPEP, UPEP  Lab Results  Component Value Date   WBC 5.5 05/23/2019   NEUTROABS 3.4 05/23/2019   HGB 13.7 05/23/2019   HCT 41.9 05/23/2019   MCV 87.7 05/23/2019   PLT 205 05/23/2019      Chemistry      Component Value Date/Time   NA 140 05/23/2019 1202   NA 139  09/22/2017 0829   K 3.8 05/23/2019 1202   K 3.5 09/22/2017 0829   CL 105 05/23/2019 1202   CO2 24 05/23/2019 1202   CO2 26 09/22/2017 0829   BUN 13 05/23/2019 1202   BUN 14.1 09/22/2017 0829   CREATININE 0.90 05/23/2019 1202   CREATININE 0.93 02/06/2019 1215   CREATININE 0.9 09/22/2017 0829      Component Value Date/Time   CALCIUM 9.1 05/23/2019 1202   CALCIUM 9.1 09/22/2017 0829   ALKPHOS 74 05/23/2019 1202   ALKPHOS 55 09/22/2017 0829   AST 24 05/23/2019 1202   AST 31 02/06/2019 1215   AST 22 09/22/2017 0829   ALT 32 05/23/2019 1202   ALT 48 (H) 02/06/2019 1215   ALT 30 09/22/2017 0829   BILITOT 0.3 05/23/2019 1202   BILITOT 0.2 (L) 02/06/2019 1215   BILITOT 0.35 09/22/2017 0829       All questions were answered. The patient knows to call the clinic with any problems, questions or concerns. No barriers to learning was detected.  I spent 15 minutes counseling the patient face to face. The total time spent in the appointment was 20 minutes and more than 50% was on counseling and review of test results  [name], MD 05/24/2019 9:27 AM

## 2019-05-30 ENCOUNTER — Ambulatory Visit (HOSPITAL_COMMUNITY)
Admission: RE | Admit: 2019-05-30 | Discharge: 2019-05-30 | Disposition: A | Discharge status: Home / Self Care | Payer: Medicaid Other | Source: Ambulatory Visit | Attending: Hematology and Oncology | Admitting: Hematology and Oncology

## 2019-05-30 ENCOUNTER — Other Ambulatory Visit: Payer: Self-pay

## 2019-05-30 ENCOUNTER — Encounter (HOSPITAL_COMMUNITY): Payer: Self-pay

## 2019-05-30 DIAGNOSIS — C760 Malignant neoplasm of head, face and neck: Secondary | ICD-10-CM | POA: Diagnosis present

## 2019-05-30 MED ORDER — IOHEXOL 300 MG/ML  SOLN
75.0000 mL | Freq: Once | INTRAMUSCULAR | Status: AC | PRN
  Administered 2019-05-30: 17:00:00 75 mL via INTRAVENOUS

## 2019-05-30 MED ORDER — SODIUM CHLORIDE (PF) 0.9 % IJ SOLN
INTRAMUSCULAR | Status: AC
  Filled 2019-05-30: qty 50

## 2019-05-31 ENCOUNTER — Other Ambulatory Visit: Payer: Self-pay

## 2019-05-31 ENCOUNTER — Inpatient Hospital Stay (HOSPITAL_BASED_OUTPATIENT_CLINIC_OR_DEPARTMENT_OTHER): Payer: Medicaid Other | Admitting: Hematology and Oncology

## 2019-05-31 VITALS — BP 123/75 | HR 72 | Temp 98.7°F | Resp 18 | Ht 69.0 in | Wt 199.6 lb

## 2019-05-31 DIAGNOSIS — Z452 Encounter for adjustment and management of vascular access device: Secondary | ICD-10-CM | POA: Diagnosis not present

## 2019-05-31 DIAGNOSIS — C760 Malignant neoplasm of head, face and neck: Secondary | ICD-10-CM

## 2019-05-31 MED ORDER — DEXAMETHASONE 2 MG PO TABS: 2.0000 mg | ORAL_TABLET | Freq: Every day | ORAL | 0 refills | Status: DC

## 2019-06-01 ENCOUNTER — Ambulatory Visit: Payer: Medicaid Other | Admitting: Hematology and Oncology

## 2019-06-01 ENCOUNTER — Encounter: Payer: Self-pay | Admitting: Hematology and Oncology

## 2019-06-01 NOTE — Progress Notes (Signed)
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   INTERVAL HISTORY: Please see below for problem oriented charting. 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 Pan-endoscopy elsewhere showed cancer from nasopharyngeal space.   01/04/2007 - 02/20/2007  Chemotherapy   He received 2 cycles of cisplatin and 5FU followed by concurrent chemo with weekly cisplatin and radiation. He only received 2 doses of chemo due to severe mucositis, nausea and weight loss.   04/05/2007 - 08/04/2007 Chemotherapy   He received 4 more courses of cisplatin with 5FU and had complete response   07/05/2009 Procedure   Fine-needle aspirate of the right level II lymph nodes come from recurrent metastatic disease. Repeat endoscopy and CT scan show no evidence of disease elsewhere.   07/08/2009 - 12/02/2009 Chemotherapy   He was given 6 cycles of carboplatin, 5-FU and docetaxel   12/03/2009 Surgery   He has surgery to the residual lymph node on the right neck which showed no evidence of disease.   02/22/2012 Imaging   Repeat imaging study showed large recurrent mass. He was referred elsewhere for further treatment.   05/03/2012 Surgery   He underwent left upper lobectomy.   04/29/2013 Imaging   PEt scan showed lesion on right level II B and lower lung was abnormal   06/03/2013 - 02/02/2014 Chemotherapy   He had 6 cycles of chemotherapy when he was found to have recurrence of cancer and had received oxaliplatin and capecitabine   06/07/2014 Imaging   PET CT scan showed persistent disease in the right neck lymph nodes and left lung   06/29/2014 Procedure   Accession: [id number] repeat LUL biopsy confirmed metastatic cancer   07/18/2014 - 07/31/2014 Radiation Therapy   He received palliative radiation therapy to the lungs   10/10/2014 Imaging   CT scan of the chest, abdomen and pelvis show  regression in the size of the lung nodule in the left upper lobe and stable pulmonary nodules   01/24/2015 Imaging   CT scan showed stable disease in neck and lung   06/19/2015 Imaging   CT scan of the neck and the chest show possible mild progression of the nodule in the right side of the neck.   06/25/2015 Imaging   PET scan confirmed disease recurrence in the neck   07/07/2015  Imaging   He had [facility] neck [facility]   09/03/2015 - 08/26/2018 Chemotherapy   He received palliative chemo with Nivolumab   10/29/2015 Imaging   PET CT showed positive response to Rx   02/28/2016 Imaging   Ct abdomen showed abnormal thinkening in his stomach   03/03/2016 Imaging   CT: Right sternocleidomastoid muscle metastasis appears less distinct but otherwise not significantly changed in size or configuration since 06/19/2015.2. Left level 3 lymph node which was hypermetabolic by PET-CT in January 2017 appears slightly smaller   04/01/2016 Imaging   CT cervical spine showed no acute fracture or traumatic malalignment in the cervical spine   04/22/2016 Procedure   Port-a-cath placed.   06/16/2016 Imaging   Ct neck showed right sternocleidomastoid muscle metastasis is further decreased in conspicuity since May, and has mildly decreased in size since September 2016. Continued stability of sub-centimeter left cervical lymph nodes. No new or progressive metastatic disease in the neck.   06/16/2016 Imaging   CT chest showed stable masslike radiation fibrosis in the left upper lobe. Stable subcentimeter pulmonary nodules in the bilateral lower lobes. No new or progressive metastatic disease in the chest. Nonobstructing left renal stone.   10/13/2016 Imaging   Ct neck showed unchanged right sternocleidomastoid muscle metastasis. Unchanged subcentimeter left cervical lymph nodes. No evidence of new or progressive metastatic disease in the neck.   10/13/2016 Imaging   CT chest showed tiny hypervascular foci in the liver, not definitely seen on prior imaging of 06/16/2016 and 02/28/2016. Abdomen MRI without and with contrast recommended to further evaluate as metastatic disease is a concern. 2. Stable appearance of post treatment changes left upper lung and scattered tiny bilateral pulmonary nodules.   02/11/2017 Imaging   Ct neck: Lymph node mass right posterior neck appears improved from the prior  study. Small posterior lymph nodes on the left unchanged. Occluded right jugular vein unchanged.   02/11/2017 Imaging   1. Similar appearance of postsurgical and radiation changes in the left upper lobe. 2. Similar bilateral pulmonary nodules. 3. No thoracic adenopathy. 4. Subtle foci of post-contrast enhancement within the liver are suboptimally characterized on this nondedicated study. Likely similar. These could either be re-evaluated at followup or more entirely characterized with abdominal MRI. 5. Left nephrolithiasis.   05/19/2017 Imaging   Matted lymph node mass right posterior neck appears larger in the recent CT. Accurate measurements difficult due to infiltrating tumor margins and infiltration of the muscle. Right jugular vein again appears occluded or resected. Small left posterior lymph nodes stable. Left upper lobe airspace density stable and similar to the prior CT   06/03/2017 PET scan   1. Hypermetabolic ill-defined right level IIb lymph node, about 1.3 cm in diameter with maximum SUV 9.5 (formerly 8.1). Appearance suspicious for residual/recurrent malignancy. No worrisome left-sided lesion. 2. Left suprahilar indistinct opacity demonstrates no worrisome hypermetabolic activity. The 5 mm left lower lobe pulmonary nodule is stable and not currently hypermetabolic although below sensitive PET-CT size thresholds. 3. Other imaging findings of potential clinical significance: Bilateral  nonobstructive nephrolithiasis. Chronic bilateral maxillary sinusitis.   05/11/2018 PET scan   1. Continued chronic accentuated metabolic activity in the vicinity of right level IIB and the adjacent right sternocleidomastoid muscle, with ill definition of surrounding tissue planes. Maximum SUV is currently 8.1, formerly 9.5. Accentuated metabolic activity is been present in this vicinity back through 06/25/2015, and there was also some low-level activity in this vicinity on 06/07/2014. Some of this may be from  scarring and local muscular activity although clearly a component of residual tumor is difficult to exclude given the focally high activity. 2. Other imaging findings of potential clinical significance: Chronic bilateral maxillary sinusitis. Chronic scarring in the left upper lobe. Chronically stable 5 mm left lower lobe nodule is considered benign. Nonobstructive left nephrolithiasis.   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 COMMENT:No significant keratinization is identified. Other basaloid carcinomas are in the differential diagnosis. No cell block material is available for further testing.   09/12/2018 Procedure   He underwent fine Needle Aspiration   10/04/2018 PET scan   1. Significant progression of local recurrence laterally in the mid right neck with an enlarging, increasingly hypermetabolic soft tissue mass. This involves the right sternocleidomastoid muscle. 2. Small lymph nodes in the right axilla are increasingly hypermetabolic. These are nonspecific and potentially reactive, although could reflect a small metastases. Small hypermetabolic nodule in the left suprasternal notch is unchanged. 3. No other evidence of metastatic disease.    10/07/2018 -  Chemotherapy   The patient had cisplatin plus gemzar   12/07/2018 Imaging   1. Decreased size of lateral right neck mass. 2. Unchanged soft tissue nodule in the suprasternal notch. 3. No evidence of new metastatic disease in the neck.    05/30/2019 Imaging   CT neck No clear change or progression compared to the study of March. Overall measurements of the right lateral neck mass are similar, approximately 3 x 1.8 cm. See above discussion. One could argue that there is slight increase in lateral bulging, possibly with an increase in contrast enhancement, towards the inferior margin.  This is of questionable validity but could possibly represent some progression or inflammatory change. Other findings in the region are stable.     REVIEW OF SYSTEMS:   Constitutional: Denies fevers, chills or abnormal weight loss Eyes: Denies blurriness of vision Ears, nose, mouth, throat, and face: Denies mucositis or sore throat Respiratory: Denies cough, dyspnea or wheezes Cardiovascular: Denies palpitation, chest discomfort or lower extremity swelling Gastrointestinal:  Denies nausea, heartburn or change in bowel habits Skin: Denies abnormal skin rashes Lymphatics: Denies new lymphadenopathy or easy bruising Neurological:Denies numbness, tingling or new weaknesses Behavioral/Psych: Mood is stable, no new changes  All other systems were reviewed with the patient and are negative.  I have reviewed the past medical history, past surgical history, social history and family history with the patient and they are unchanged from previous note.  ALLERGIES:  is allergic to phenergan [promethazine hcl]; heparin; and clindamycin.  MEDICATIONS:  Current Outpatient Medications  Medication Sig Dispense Refill   cetirizine (ZYRTEC) 10 MG tablet Take 10 mg by mouth daily.     dexamethasone (DECADRON) 2 MG tablet Take 1 tablet (2 mg total) by mouth daily. 14 tablet 0   levothyroxine (SYNTHROID, LEVOTHROID) 137 MCG tablet TAKE 1 TABLET(137 MCG) BY MOUTH DAILY BEFORE BREAKFAST (Patient taking differently: Take 137 mcg by mouth daily before  breakfast. ) 90 tablet 0   lidocaine (XYLOCAINE) 2 % solution Use as directed 5 mLs in the mouth or throat every 3 (three) hours as needed for mouth pain. Swish, gargle and spit 100 mL 2   LORazepam (ATIVAN) 1 MG tablet Take 1 tablet (1 mg total) by mouth every 8 (eight) hours as needed for anxiety. 30 tablet 3   magic mouthwash SOLN Take 10 mLs by mouth 4 (four) times daily as needed for mouth pain. Swish and swallow or spit 240 mL 2   metFORMIN (GLUCOPHAGE)  500 MG tablet TAKE 1 TABLET(500 MG) BY MOUTH DAILY WITH BREAKFAST 90 tablet 1   mirtazapine (REMERON) 15 MG tablet Take 1 tablet (15 mg total) by mouth at bedtime. 30 tablet 1   omeprazole (PRILOSEC) 20 MG capsule Take 1 capsule (20 mg total) by mouth daily. 30 capsule 5   ondansetron (ZOFRAN) 8 MG tablet Take 1 tablet (8 mg total) by mouth every 8 (eight) hours as needed for nausea or vomiting. 90 tablet 1   oxyCODONE (OXY IR/ROXICODONE) 5 MG immediate release tablet Take 1 tablet (5 mg total) by mouth every 8 (eight) hours as needed. 60 tablet 0   PARoxetine (PAXIL) 20 MG tablet Take 1 tablet (20 mg total) by mouth daily. 30 tablet 5   polyethylene glycol (MIRALAX) packet Take 17 g by mouth daily. (Patient taking differently: Take 17 g by mouth daily as needed for mild constipation or moderate constipation. ) 14 each 3   prochlorperazine (COMPAZINE) 10 MG tablet Take 1 tablet (10 mg total) by mouth every 12 (twelve) hours as needed (Nausea or vomiting). 30 tablet 1   senna-docusate (SENOKOT-S) 8.6-50 MG tablet Take 2 tablets by mouth 3 (three) times daily. 90 tablet 0   No current facility-administered medications for this visit.     PHYSICAL EXAMINATION: ECOG PERFORMANCE STATUS: 1 - Symptomatic but completely ambulatory  Vitals:   05/31/19 0947  BP: 123/75  Pulse: 72  Resp: 18  Temp: 98.7 F (37.1 C)  SpO2: 100%   Filed Weights   05/31/19 0947  Weight: 199 lb 9.6 oz (90.5 kg)    GENERAL:alert, no distress and comfortable SKIN: He has persistent skin discoloration on the side of his neck NEURO: alert & oriented x 3 with fluent speech, no focal motor/sensory deficits  LABORATORY DATA:  I have reviewed the data as listed    Component Value Date/Time   NA 140 05/23/2019 1202   NA 139 09/22/2017 0829   K 3.8 05/23/2019 1202   K 3.5 09/22/2017 0829   CL 105 05/23/2019 1202   CO2 24 05/23/2019 1202   CO2 26 09/22/2017 0829   GLUCOSE 121 (H) 05/23/2019 1202   GLUCOSE  133 09/22/2017 0829   BUN 13 05/23/2019 1202   BUN 14.1 09/22/2017 0829   CREATININE 0.90 05/23/2019 1202   CREATININE 0.93 02/06/2019 1215   CREATININE 0.9 09/22/2017 0829   CALCIUM 9.1 05/23/2019 1202   CALCIUM 9.1 09/22/2017 0829   PROT 6.8 05/23/2019 1202   PROT 6.8 09/22/2017 0829   ALBUMIN 4.0 05/23/2019 1202   ALBUMIN 4.1 09/22/2017 0829   AST 24 05/23/2019 1202   AST 31 02/06/2019 1215   AST 22 09/22/2017 0829   ALT 32 05/23/2019 1202   ALT 48 (H) 02/06/2019 1215   ALT 30 09/22/2017 0829   ALKPHOS 74 05/23/2019 1202   ALKPHOS 55 09/22/2017 0829   BILITOT 0.3 05/23/2019 1202   BILITOT 0.2 (L) 02/06/2019  1215   BILITOT 0.35 09/22/2017 0829   GFRNONAA >60 05/23/2019 1202   GFRNONAA >60 02/06/2019 1215   GFRAA >60 05/23/2019 1202   GFRAA >60 02/06/2019 1215    No results found for: SPEP, UPEP  Lab Results  Component Value Date   WBC 5.5 05/23/2019   NEUTROABS 3.4 05/23/2019   HGB 13.7 05/23/2019   HCT 41.9 05/23/2019   MCV 87.7 05/23/2019   PLT 205 05/23/2019      Chemistry      Component Value Date/Time   NA 140 05/23/2019 1202   NA 139 09/22/2017 0829   K 3.8 05/23/2019 1202   K 3.5 09/22/2017 0829   CL 105 05/23/2019 1202   CO2 24 05/23/2019 1202   CO2 26 09/22/2017 0829   BUN 13 05/23/2019 1202   BUN 14.1 09/22/2017 0829   CREATININE 0.90 05/23/2019 1202   CREATININE 0.93 02/06/2019 1215   CREATININE 0.9 09/22/2017 0829      Component Value Date/Time   CALCIUM 9.1 05/23/2019 1202   CALCIUM 9.1 09/22/2017 0829   ALKPHOS 74 05/23/2019 1202   ALKPHOS 55 09/22/2017 0829   AST 24 05/23/2019 1202   AST 31 02/06/2019 1215   AST 22 09/22/2017 0829   ALT 32 05/23/2019 1202   ALT 48 (H) 02/06/2019 1215   ALT 30 09/22/2017 0829   BILITOT 0.3 05/23/2019 1202   BILITOT 0.2 (L) 02/06/2019 1215   BILITOT 0.35 09/22/2017 0829         RADIOGRAPHIC STUDIES: I have reviewed the imaging study with the patient I have personally reviewed the  radiological images as listed and agreed with the findings in the report. Ct Soft Tissue Neck W Contrast  Result Date: 05/31/2019 CLINICAL DATA:  Palpable right neck mass over the last 2 weeks. History nasopharyngeal carcinoma 2007 and lung cancer 2013. EXAM: CT NECK WITH CONTRAST TECHNIQUE: Multidetector CT imaging of the neck was performed using the standard protocol following the bolus administration of intravenous contrast. CONTRAST:  67mL OMNIPAQUE IOHEXOL 300 MG/ML  SOLN COMPARISON:  12/06/2018.  10/04/2018. FINDINGS: Pharynx and larynx: No new mucosal or submucosal lesion is seen. Salivary glands: Parotid glands are normal, except for some fatty atrophy of the inferior right parotid. Left submandibular gland is small but negative. Right submandibular gland is missing. Thyroid: Normal Lymph nodes: No new or enlarging lymph nodes on either side of the neck. Soft tissue mass in the right lateral neck at the level C3-4 appears very similar to the study of March, allowing for differences in slice position and angulation in the ill-defined nature of the lesion. A proximal transverse diameter is 3 x 1.8 cm. One could argue that there is slight increase in lateral bulging of the mass towards the inferior aspect, with slight increase in enhancement. Elsewhere, there is some chronic scarring in the right neck including in the right supraclavicular and brachial plexus region without increasing mass effect. Node or nodule at the thoracic inlet midline is unchanged at 13 x 8 mm. Vascular: No acute arterial finding. Previous jugular vein resection on the right. Limited intracranial: Normal Visualized orbits: Normal Mastoids and visualized paranasal sinuses: Clear Skeleton: Normal Upper chest: 7 chronic scarring at the lung apices left more than right. Postsurgical changes in the left upper lung. Other: None IMPRESSION: No clear change or progression compared to the study of March. Overall measurements of the right  lateral neck mass are similar, approximately 3 x 1.8 cm. See above discussion. One could argue  that there is slight increase in lateral bulging, possibly with an increase in contrast enhancement, towards the inferior margin. This is of questionable validity but could possibly represent some progression or inflammatory change. Other findings in the region are stable. Electronically Signed   By: Nelson Chimes M.D.   On: 05/31/2019 09:54    All questions were answered. The patient knows to call the clinic with any problems, questions or concerns. No barriers to learning was detected.  I spent 15 minutes counseling the patient face to face. The total time spent in the appointment was 20 minutes and more than 50% was on counseling and review of test results  [name], MD 06/01/2019 9:48 AM

## 2019-06-01 NOTE — Assessment & Plan Note (Signed)
I have reviewed CT imaging with the patient The size of the neck changes are unchanged The patient has different kind of pain Inflammatory responses in that region cannot be excluded I recommend trial of steroid therapy for 2 weeks to see if it would change anything I do not recommend biopsy due to expected risk of poor wound healing in that location, given prior radiation With his permission, a picture is taken I will see him back again in 2 weeks for further follow-up

## 2019-06-02 ENCOUNTER — Telehealth: Payer: Self-pay | Admitting: Hematology and Oncology

## 2019-06-02 NOTE — Telephone Encounter (Signed)
I talk with patient regarding 9/9

## 2019-06-05 ENCOUNTER — Telehealth: Payer: Self-pay

## 2019-06-05 NOTE — Telephone Encounter (Signed)
Pt called reporting that the dexamethasone is making him irritable, but it is manageable.  He states if he can get outside the house to walk or exercise the feeling goes away.   He also reports that the swelling in his neck has gotten worse.

## 2019-06-05 NOTE — Telephone Encounter (Signed)
Called and LVM on number provided for pt to return call

## 2019-06-05 NOTE — Telephone Encounter (Signed)
Pls call him back and inquire how we can help

## 2019-06-06 NOTE — Telephone Encounter (Signed)
Patient will come in tomorrow at 8:30am

## 2019-06-06 NOTE — Telephone Encounter (Signed)
Left message to come in at 85- asked for return call to confirm

## 2019-06-06 NOTE — Telephone Encounter (Signed)
Can you call him and see if he wants to come in today at 69?

## 2019-06-06 NOTE — Telephone Encounter (Signed)
He is unable to come today at 74. He would like to see about coming tomorrow morning instead?

## 2019-06-06 NOTE — Telephone Encounter (Signed)
Tomorrow morning before 10

## 2019-06-07 ENCOUNTER — Inpatient Hospital Stay: Payer: Medicaid Other | Attending: Hematology and Oncology | Admitting: Hematology and Oncology

## 2019-06-07 ENCOUNTER — Other Ambulatory Visit: Payer: Self-pay

## 2019-06-07 DIAGNOSIS — Z923 Personal history of irradiation: Secondary | ICD-10-CM | POA: Insufficient documentation

## 2019-06-07 DIAGNOSIS — C7989 Secondary malignant neoplasm of other specified sites: Secondary | ICD-10-CM | POA: Insufficient documentation

## 2019-06-07 DIAGNOSIS — G893 Neoplasm related pain (acute) (chronic): Secondary | ICD-10-CM | POA: Insufficient documentation

## 2019-06-07 DIAGNOSIS — Z7189 Other specified counseling: Secondary | ICD-10-CM | POA: Diagnosis not present

## 2019-06-07 DIAGNOSIS — C760 Malignant neoplasm of head, face and neck: Secondary | ICD-10-CM

## 2019-06-07 DIAGNOSIS — C119 Malignant neoplasm of nasopharynx, unspecified: Secondary | ICD-10-CM | POA: Diagnosis present

## 2019-06-08 ENCOUNTER — Encounter: Payer: Self-pay | Admitting: Hematology and Oncology

## 2019-06-08 NOTE — Progress Notes (Signed)
[facility] OFF[facility] PROGRESS NOTE  Patient Care Team: Patient, No Pcp Per as PCP - General (General Practice) [name], MD as Consulting Physician (Hematology and Oncology) Eppie Gibson, MD as Attending Physician (Radiation Oncology) Jodi Marble, MD as Consulting Physician (Otolaryngology) Philomena Doheny, MD as Referring Physician (Plastic Surgery) Irene Shipper, MD as Consulting Physician (Gastroenterology) System, Provider Not In  ASSESSMENT & PLAN:  Primary cancer of head and neck [facility]) He continues to experience significant pain despite being on corticosteroid treatment He is still concern about the bulge seen on the previous cancer recurrence scar that overall his symptoms are representative of cancer recurrence Unfortunately, his insurance will not approve PET CT scan I do not recommend biopsy due to previous history of poor wound healing due to extensive exposure to radiation We discussed the risk and benefits of resumption of chemotherapy and he agreed to proceed However, the patient is to attending a night school and due to scheduling he issue, he is not able to commit to the timing of the treatment We discussed the risk, benefits, side effects of resumption of treatment with combination cisplatin with gemcitabine along with dose adjustment versus gemcitabine alone He is undecided He will call me in a few days time for final decision  Cancer associated pain I recommend discontinuation of corticosteroids as it has not been helpful He will continue to take pain medicine as needed  Goals of care, counseling/discussion We had numerous goals of care discussions in the past The patient understood that his treatment is strictly palliative in nature   No orders of the defined types were placed in this encounter.   INTERVAL HISTORY: Please see below for problem oriented charting. 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 Pan-endoscopy elsewhere showed cancer from nasopharyngeal space.   01/04/2007 - 02/20/2007 Chemotherapy   He received 2 cycles of cisplatin and 5FU followed by concurrent chemo with weekly cisplatin and radiation. He only received 2 doses of chemo due to severe mucositis, nausea and weight loss.   04/05/2007 - 08/04/2007 Chemotherapy   He received 4 more courses of cisplatin with 5FU and had complete response   07/05/2009 Procedure   Fine-needle aspirate of the right level II lymph nodes come from recurrent metastatic disease. Repeat endoscopy and CT scan show no evidence of disease elsewhere.   07/08/2009 - 12/02/2009 Chemotherapy   He was given 6 cycles of carboplatin, 5-FU and docetaxel   12/03/2009 Surgery   He has surgery to the residual lymph node on the right neck which showed no evidence of disease.   02/22/2012 Imaging   Repeat imaging study showed large recurrent mass. He was referred elsewhere for further treatment.   05/03/2012 Surgery   He underwent left upper  lobectomy.   04/29/2013 Imaging   PEt scan showed lesion on right level II B and lower lung was abnormal   06/03/2013 - 02/02/2014 Chemotherapy   He had 6 cycles of chemotherapy when he was found to have recurrence of cancer and had received oxaliplatin  and capecitabine   06/07/2014 Imaging   PET CT scan showed persistent disease in the right neck lymph nodes and left lung   06/29/2014 Procedure   Accession: [id number] repeat LUL biopsy confirmed metastatic cancer   07/18/2014 - 07/31/2014 Radiation Therapy   He received palliative radiation therapy to the lungs   10/10/2014 Imaging   CT scan of the chest, abdomen and pelvis show regression in the size of the lung nodule in the left upper lobe and stable pulmonary nodules   01/24/2015 Imaging   CT scan showed stable disease in neck and lung   06/19/2015 Imaging   CT scan of the neck and the chest show possible mild progression of the nodule in the right side of the neck.   06/25/2015 Imaging   PET scan confirmed disease recurrence in the neck   07/07/2015 Imaging   He had [facility] neck [facility]   09/03/2015 - 08/26/2018 Chemotherapy   He received palliative chemo with Nivolumab   10/29/2015 Imaging   PET CT showed positive response to Rx   02/28/2016 Imaging   Ct abdomen showed abnormal thinkening in his stomach   03/03/2016 Imaging   CT: Right sternocleidomastoid muscle metastasis appears less distinct but otherwise not significantly changed in size or configuration since 06/19/2015.2. Left level 3 lymph node which was hypermetabolic by PET-CT in January 2017 appears slightly smaller   04/01/2016 Imaging   CT cervical spine showed no acute fracture or traumatic malalignment in the cervical spine   04/22/2016 Procedure   Port-a-cath placed.   06/16/2016 Imaging   Ct neck showed right sternocleidomastoid muscle metastasis is further decreased in conspicuity since May, and has mildly decreased in size since September 2016. Continued stability of sub-centimeter left cervical lymph nodes. No new or progressive metastatic disease in the neck.   06/16/2016 Imaging   CT chest showed stable masslike radiation fibrosis in the left upper lobe. Stable subcentimeter pulmonary nodules in the bilateral  lower lobes. No new or progressive metastatic disease in the chest. Nonobstructing left renal stone.   10/13/2016 Imaging   Ct neck showed unchanged right sternocleidomastoid muscle metastasis. Unchanged subcentimeter left cervical lymph nodes. No evidence of new or progressive metastatic disease in the neck.   10/13/2016 Imaging   CT chest showed tiny hypervascular foci in the liver, not definitely seen on prior imaging of 06/16/2016 and 02/28/2016. Abdomen MRI without and with contrast recommended to further evaluate as metastatic disease is a concern. 2. Stable appearance of post treatment changes left upper lung and scattered tiny bilateral pulmonary nodules.   02/11/2017 Imaging   Ct neck: Lymph node mass right posterior neck appears improved from the prior study. Small posterior lymph nodes on the left unchanged. Occluded right jugular vein unchanged.   02/11/2017 Imaging   1. Similar appearance of postsurgical and radiation changes in the left upper lobe. 2. Similar bilateral pulmonary nodules. 3. No thoracic adenopathy. 4. Subtle foci of post-contrast enhancement within the liver are suboptimally characterized on this nondedicated study. Likely similar. These could either be re-evaluated at followup or more entirely characterized with abdominal MRI. 5. Left nephrolithiasis.   05/19/2017 Imaging   Matted lymph node mass right posterior neck appears larger  in the recent CT. Accurate measurements difficult due to infiltrating tumor margins and infiltration of the muscle. Right jugular vein again appears occluded or resected. Small left posterior lymph nodes stable. Left upper lobe airspace density stable and similar to the prior CT   06/03/2017 PET scan   1. Hypermetabolic ill-defined right level IIb lymph node, about 1.3 cm in diameter with maximum SUV 9.5 (formerly 8.1). Appearance suspicious for residual/recurrent malignancy. No worrisome left-sided lesion. 2. Left suprahilar indistinct  opacity demonstrates no worrisome hypermetabolic activity. The 5 mm left lower lobe pulmonary nodule is stable and not currently hypermetabolic although below sensitive PET-CT size thresholds. 3. Other imaging findings of potential clinical significance: Bilateral nonobstructive nephrolithiasis. Chronic bilateral maxillary sinusitis.   05/11/2018 PET scan   1. Continued chronic accentuated metabolic activity in the vicinity of right level IIB and the adjacent right sternocleidomastoid muscle, with ill definition of surrounding tissue planes. Maximum SUV is currently 8.1, formerly 9.5. Accentuated metabolic activity is been present in this vicinity back through 06/25/2015, and there was also some low-level activity in this vicinity on 06/07/2014. Some of this may be from scarring and local muscular activity although clearly a component of residual tumor is difficult to exclude given the focally high activity. 2. Other imaging findings of potential clinical significance: Chronic bilateral maxillary sinusitis. Chronic scarring in the left upper lobe. Chronically stable 5 mm left lower lobe nodule is considered benign. Nonobstructive left nephrolithiasis.   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 COMMENT:No significant keratinization is identified. Other basaloid carcinomas are in the differential diagnosis. No cell block material is available for further testing.   09/12/2018 Procedure   He underwent fine Needle Aspiration   10/04/2018 PET scan   1. Significant progression of local recurrence laterally in the mid right neck with an enlarging, increasingly hypermetabolic soft tissue mass. This involves the right sternocleidomastoid muscle. 2. Small lymph nodes in the right axilla are increasingly hypermetabolic. These are nonspecific and potentially  reactive, although could reflect a small metastases. Small hypermetabolic nodule in the left suprasternal notch is unchanged. 3. No other evidence of metastatic disease.    10/07/2018 - 12/23/2018 Chemotherapy   The patient had cisplatin plus gemzar   12/07/2018 Imaging   1. Decreased size of lateral right neck mass. 2. Unchanged soft tissue nodule in the suprasternal notch. 3. No evidence of new metastatic disease in the neck.    05/30/2019 Imaging   CT neck No clear change or progression compared to the study of March. Overall measurements of the right lateral neck mass are similar, approximately 3 x 1.8 cm. See above discussion. One could argue that there is slight increase in lateral bulging, possibly with an increase in contrast enhancement, towards the inferior margin. This is of questionable validity but could possibly represent some progression or inflammatory change. Other findings in the region are stable.     REVIEW OF SYSTEMS:   Constitutional: Denies fevers, chills or abnormal weight loss Eyes: Denies blurriness of vision Ears, nose, mouth, throat, and face: Denies mucositis or sore throat Respiratory: Denies cough, dyspnea or wheezes Cardiovascular: Denies palpitation, chest discomfort or lower extremity swelling Gastrointestinal:  Denies nausea, heartburn or change in bowel habits Skin: Denies abnormal skin rashes Lymphatics: Denies new lymphadenopathy or easy bruising Neurological:Denies numbness, tingling or new weaknesses Behavioral/Psych: Mood is stable, no new changes  All other systems were  reviewed with the patient and are negative.  I have reviewed the past medical history, past surgical history, social history and family history with the patient and they are unchanged from previous note.  ALLERGIES:  is allergic to phenergan [promethazine hcl]; heparin; and clindamycin.  MEDICATIONS:  Current Outpatient Medications  Medication Sig Dispense Refill  .  cetirizine (ZYRTEC) 10 MG tablet Take 10 mg by mouth daily.    Marland Kitchen levothyroxine (SYNTHROID, LEVOTHROID) 137 MCG tablet TAKE 1 TABLET(137 MCG) BY MOUTH DAILY BEFORE BREAKFAST (Patient taking differently: Take 137 mcg by mouth daily before breakfast. ) 90 tablet 0  . lidocaine (XYLOCAINE) 2 % solution Use as directed 5 mLs in the mouth or throat every 3 (three) hours as needed for mouth pain. Swish, gargle and spit 100 mL 2  . LORazepam (ATIVAN) 1 MG tablet Take 1 tablet (1 mg total) by mouth every 8 (eight) hours as needed for anxiety. 30 tablet 3  . magic mouthwash SOLN Take 10 mLs by mouth 4 (four) times daily as needed for mouth pain. Swish and swallow or spit 240 mL 2  . metFORMIN (GLUCOPHAGE) 500 MG tablet TAKE 1 TABLET(500 MG) BY MOUTH DAILY WITH BREAKFAST 90 tablet 1  . mirtazapine (REMERON) 15 MG tablet Take 1 tablet (15 mg total) by mouth at bedtime. 30 tablet 1  . omeprazole (PRILOSEC) 20 MG capsule Take 1 capsule (20 mg total) by mouth daily. 30 capsule 5  . ondansetron (ZOFRAN) 8 MG tablet Take 1 tablet (8 mg total) by mouth every 8 (eight) hours as needed for nausea or vomiting. 90 tablet 1  . oxyCODONE (OXY IR/ROXICODONE) 5 MG immediate release tablet Take 1 tablet (5 mg total) by mouth every 8 (eight) hours as needed. 60 tablet 0  . PARoxetine (PAXIL) 20 MG tablet Take 1 tablet (20 mg total) by mouth daily. 30 tablet 5  . polyethylene glycol (MIRALAX) packet Take 17 g by mouth daily. (Patient taking differently: Take 17 g by mouth daily as needed for mild constipation or moderate constipation. ) 14 each 3  . prochlorperazine (COMPAZINE) 10 MG tablet Take 1 tablet (10 mg total) by mouth every 12 (twelve) hours as needed (Nausea or vomiting). 30 tablet 1  . senna-docusate (SENOKOT-S) 8.6-50 MG tablet Take 2 tablets by mouth 3 (three) times daily. 90 tablet 0   No current facility-administered medications for this visit.     PHYSICAL EXAMINATION: ECOG PERFORMANCE STATUS: 1 - Symptomatic  but completely ambulatory  Vitals:   06/07/19 0907  BP: 123/81  Pulse: 81  Resp: 18  Temp: 98.3 F (36.8 C)  SpO2: 100%   Filed Weights   06/07/19 0907  Weight: 200 lb 3.2 oz (90.8 kg)    GENERAL:alert, no distress and comfortable NECK: His neck is discolored.  There is a very small nodule noted on the right side of his neck NEURO: alert & oriented x 3 with fluent speech, no focal motor/sensory deficits  LABORATORY DATA:  I have reviewed the data as listed    Component Value Date/Time   NA 140 05/23/2019 1202   NA 139 09/22/2017 0829   K 3.8 05/23/2019 1202   K 3.5 09/22/2017 0829   CL 105 05/23/2019 1202   CO2 24 05/23/2019 1202   CO2 26 09/22/2017 0829   GLUCOSE 121 (H) 05/23/2019 1202   GLUCOSE 133 09/22/2017 0829   BUN 13 05/23/2019 1202   BUN 14.1 09/22/2017 0829   CREATININE 0.90 05/23/2019 1202   CREATININE  0.93 02/06/2019 1215   CREATININE 0.9 09/22/2017 0829   CALCIUM 9.1 05/23/2019 1202   CALCIUM 9.1 09/22/2017 0829   PROT 6.8 05/23/2019 1202   PROT 6.8 09/22/2017 0829   ALBUMIN 4.0 05/23/2019 1202   ALBUMIN 4.1 09/22/2017 0829   AST 24 05/23/2019 1202   AST 31 02/06/2019 1215   AST 22 09/22/2017 0829   ALT 32 05/23/2019 1202   ALT 48 (H) 02/06/2019 1215   ALT 30 09/22/2017 0829   ALKPHOS 74 05/23/2019 1202   ALKPHOS 55 09/22/2017 0829   BILITOT 0.3 05/23/2019 1202   BILITOT 0.2 (L) 02/06/2019 1215   BILITOT 0.35 09/22/2017 0829   GFRNONAA >60 05/23/2019 1202   GFRNONAA >60 02/06/2019 1215   GFRAA >60 05/23/2019 1202   GFRAA >60 02/06/2019 1215    No results found for: SPEP, UPEP  Lab Results  Component Value Date   WBC 5.5 05/23/2019   NEUTROABS 3.4 05/23/2019   HGB 13.7 05/23/2019   HCT 41.9 05/23/2019   MCV 87.7 05/23/2019   PLT 205 05/23/2019      Chemistry      Component Value Date/Time   NA 140 05/23/2019 1202   NA 139 09/22/2017 0829   K 3.8 05/23/2019 1202   K 3.5 09/22/2017 0829   CL 105 05/23/2019 1202   CO2 24  05/23/2019 1202   CO2 26 09/22/2017 0829   BUN 13 05/23/2019 1202   BUN 14.1 09/22/2017 0829   CREATININE 0.90 05/23/2019 1202   CREATININE 0.93 02/06/2019 1215   CREATININE 0.9 09/22/2017 0829      Component Value Date/Time   CALCIUM 9.1 05/23/2019 1202   CALCIUM 9.1 09/22/2017 0829   ALKPHOS 74 05/23/2019 1202   ALKPHOS 55 09/22/2017 0829   AST 24 05/23/2019 1202   AST 31 02/06/2019 1215   AST 22 09/22/2017 0829   ALT 32 05/23/2019 1202   ALT 48 (H) 02/06/2019 1215   ALT 30 09/22/2017 0829   BILITOT 0.3 05/23/2019 1202   BILITOT 0.2 (L) 02/06/2019 1215   BILITOT 0.35 09/22/2017 0829       RADIOGRAPHIC STUDIES: I have personally reviewed the radiological images as listed and agreed with the findings in the report. Ct Soft Tissue Neck W Contrast  Result Date: 05/31/2019 CLINICAL DATA:  Palpable right neck mass over the last 2 weeks. History nasopharyngeal carcinoma 2007 and lung cancer 2013. EXAM: CT NECK WITH CONTRAST TECHNIQUE: Multidetector CT imaging of the neck was performed using the standard protocol following the bolus administration of intravenous contrast. CONTRAST:  9mL OMNIPAQUE IOHEXOL 300 MG/ML  SOLN COMPARISON:  12/06/2018.  10/04/2018. FINDINGS: Pharynx and larynx: No new mucosal or submucosal lesion is seen. Salivary glands: Parotid glands are normal, except for some fatty atrophy of the inferior right parotid. Left submandibular gland is small but negative. Right submandibular gland is missing. Thyroid: Normal Lymph nodes: No new or enlarging lymph nodes on either side of the neck. Soft tissue mass in the right lateral neck at the level C3-4 appears very similar to the study of March, allowing for differences in slice position and angulation in the ill-defined nature of the lesion. A proximal transverse diameter is 3 x 1.8 cm. One could argue that there is slight increase in lateral bulging of the mass towards the inferior aspect, with slight increase in enhancement.  Elsewhere, there is some chronic scarring in the right neck including in the right supraclavicular and brachial plexus region without increasing mass effect. Node  or nodule at the thoracic inlet midline is unchanged at 13 x 8 mm. Vascular: No acute arterial finding. Previous jugular vein resection on the right. Limited intracranial: Normal Visualized orbits: Normal Mastoids and visualized paranasal sinuses: Clear Skeleton: Normal Upper chest: 7 chronic scarring at the lung apices left more than right. Postsurgical changes in the left upper lung. Other: None IMPRESSION: No clear change or progression compared to the study of March. Overall measurements of the right lateral neck mass are similar, approximately 3 x 1.8 cm. See above discussion. One could argue that there is slight increase in lateral bulging, possibly with an increase in contrast enhancement, towards the inferior margin. This is of questionable validity but could possibly represent some progression or inflammatory change. Other findings in the region are stable. Electronically Signed   By: Nelson Chimes M.D.   On: 05/31/2019 09:54    All questions were answered. The patient knows to call the clinic with any problems, questions or concerns. No barriers to learning was detected.  I spent 15 minutes counseling the patient face to face. The total time spent in the appointment was 20 minutes and more than 50% was on counseling and review of test results  [name], MD 06/08/2019 9:56 AM

## 2019-06-08 NOTE — Assessment & Plan Note (Signed)
I recommend discontinuation of corticosteroids as it has not been helpful He will continue to take pain medicine as needed

## 2019-06-08 NOTE — Assessment & Plan Note (Signed)
We had numerous goals of care discussions in the past The patient understood that his treatment is strictly palliative in nature

## 2019-06-08 NOTE — Assessment & Plan Note (Signed)
He continues to experience significant pain despite being on corticosteroid treatment He is still concern about the bulge seen on the previous cancer recurrence scar that overall his symptoms are representative of cancer recurrence Unfortunately, his insurance will not approve PET CT scan I do not recommend biopsy due to previous history of poor wound healing due to extensive exposure to radiation We discussed the risk and benefits of resumption of chemotherapy and he agreed to proceed However, the patient is to attending a night school and due to scheduling he issue, he is not able to commit to the timing of the treatment We discussed the risk, benefits, side effects of resumption of treatment with combination cisplatin with gemcitabine along with dose adjustment versus gemcitabine alone He is undecided He will call me in a few days time for final decision

## 2019-06-14 ENCOUNTER — Ambulatory Visit: Payer: Medicaid Other | Admitting: Hematology and Oncology

## 2019-06-14 ENCOUNTER — Other Ambulatory Visit: Payer: Self-pay | Admitting: Hematology and Oncology

## 2019-06-14 ENCOUNTER — Telehealth: Payer: Self-pay

## 2019-06-14 MED ORDER — OXYCODONE HCL 5 MG PO TABS: 5.0000 mg | ORAL_TABLET | Freq: Three times a day (TID) | ORAL | 0 refills | Status: DC | PRN

## 2019-06-14 NOTE — Telephone Encounter (Signed)
done

## 2019-06-14 NOTE — Telephone Encounter (Signed)
Called and left below message. Ask him to call the office for questions.

## 2019-06-14 NOTE — Telephone Encounter (Signed)
He called requesting refill on Oxycodone.

## 2019-07-03 ENCOUNTER — Telehealth: Payer: Self-pay | Admitting: *Deleted

## 2019-07-03 ENCOUNTER — Other Ambulatory Visit: Payer: Self-pay | Admitting: Hematology and Oncology

## 2019-07-03 DIAGNOSIS — C7800 Secondary malignant neoplasm of unspecified lung: Secondary | ICD-10-CM

## 2019-07-03 DIAGNOSIS — C760 Malignant neoplasm of head, face and neck: Secondary | ICD-10-CM

## 2019-07-03 MED ORDER — OXYCODONE HCL 5 MG PO TABS: 5.0000 mg | ORAL_TABLET | Freq: Three times a day (TID) | ORAL | 0 refills | Status: DC | PRN

## 2019-07-03 NOTE — Telephone Encounter (Signed)
Can you ask him if he has decided what to do? He has no appointment currently pending his decision

## 2019-07-03 NOTE — Telephone Encounter (Signed)
I placed scheduling orders

## 2019-07-03 NOTE — Telephone Encounter (Signed)
He states he is doing well. He notes his neck is swelling and getting more painful.  He has decided: Oct 15 for 9 weeks with the Gemzar and Cisplatin combined (3 Cycles). He wants to take a break after that.    He will be going to visit relatives from tomorrow until Oct 11. He will be traveling within [place].

## 2019-07-03 NOTE — Progress Notes (Signed)
OFF PATHWAY REGIMEN - Head and Neck  No Change  Continue With Treatment as Ordered.   [id number]:Cisplatin 80 mg/m2 IV D1 + Gemcitabine 1,000 mg/m2 IV D1,8 q21 Days x 3 Cycles:   A cycle is every 21 days:     Gemcitabine      Cisplatin   **Always confirm dose/schedule in your pharmacy ordering system**  Patient Characteristics: Nasopharyngeal, Metastatic, Third Line and Beyond Disease Classification: Nasopharyngeal Current Disease Status: Metastatic Disease AJCC T Category: TX AJCC N Category: NX AJCC M Category: M1 AJCC 8 Stage Grouping: IVB Line of Therapy: Third Line and Beyond  Intent of Therapy: Non-Curative / Palliative Intent, Discussed with Patient

## 2019-07-03 NOTE — Telephone Encounter (Signed)
Patient calling for a refill on oxycodone 5mg  tablets to [facility] on [address]

## 2019-07-04 ENCOUNTER — Telehealth: Payer: Self-pay | Admitting: Hematology and Oncology

## 2019-07-04 NOTE — Telephone Encounter (Signed)
Scheduled appt per 9/28 sch message - called pt - no answer  . Left message with appt date and time

## 2019-07-18 ENCOUNTER — Inpatient Hospital Stay: Payer: Medicaid Other

## 2019-07-18 ENCOUNTER — Other Ambulatory Visit: Payer: Self-pay

## 2019-07-18 ENCOUNTER — Encounter: Payer: Self-pay | Admitting: Hematology and Oncology

## 2019-07-18 ENCOUNTER — Inpatient Hospital Stay: Payer: Medicaid Other | Attending: Hematology and Oncology | Admitting: Hematology and Oncology

## 2019-07-18 ENCOUNTER — Telehealth: Payer: Self-pay

## 2019-07-18 VITALS — BP 129/90 | HR 78 | Temp 98.3°F | Resp 18 | Ht 69.0 in | Wt 198.0 lb

## 2019-07-18 DIAGNOSIS — R112 Nausea with vomiting, unspecified: Secondary | ICD-10-CM | POA: Diagnosis not present

## 2019-07-18 DIAGNOSIS — Z7189 Other specified counseling: Secondary | ICD-10-CM

## 2019-07-18 DIAGNOSIS — Z95828 Presence of other vascular implants and grafts: Secondary | ICD-10-CM

## 2019-07-18 DIAGNOSIS — Z23 Encounter for immunization: Secondary | ICD-10-CM | POA: Insufficient documentation

## 2019-07-18 DIAGNOSIS — C7800 Secondary malignant neoplasm of unspecified lung: Secondary | ICD-10-CM

## 2019-07-18 DIAGNOSIS — C119 Malignant neoplasm of nasopharynx, unspecified: Secondary | ICD-10-CM

## 2019-07-18 DIAGNOSIS — C760 Malignant neoplasm of head, face and neck: Secondary | ICD-10-CM

## 2019-07-18 DIAGNOSIS — G893 Neoplasm related pain (acute) (chronic): Secondary | ICD-10-CM | POA: Diagnosis not present

## 2019-07-18 DIAGNOSIS — Z5111 Encounter for antineoplastic chemotherapy: Secondary | ICD-10-CM | POA: Diagnosis present

## 2019-07-18 DIAGNOSIS — Z79899 Other long term (current) drug therapy: Secondary | ICD-10-CM | POA: Insufficient documentation

## 2019-07-18 LAB — CBC WITH DIFFERENTIAL/PLATELET
Abs Immature Granulocytes: 0.01 10*3/uL (ref 0.00–0.07)
Basophils Absolute: 0 10*3/uL (ref 0.0–0.1)
Basophils Relative: 1 %
Eosinophils Absolute: 0.2 10*3/uL (ref 0.0–0.5)
Eosinophils Relative: 3 %
HCT: 41.6 % (ref 39.0–52.0)
Hemoglobin: 13.7 g/dL (ref 13.0–17.0)
Immature Granulocytes: 0 %
Lymphocytes Relative: 29 %
Lymphs Abs: 1.6 10*3/uL (ref 0.7–4.0)
MCH: 28.8 pg (ref 26.0–34.0)
MCHC: 32.9 g/dL (ref 30.0–36.0)
MCV: 87.6 fL (ref 80.0–100.0)
Monocytes Absolute: 0.5 10*3/uL (ref 0.1–1.0)
Monocytes Relative: 9 %
Neutro Abs: 3.2 10*3/uL (ref 1.7–7.7)
Neutrophils Relative %: 58 %
Platelets: 223 10*3/uL (ref 150–400)
RBC: 4.75 MIL/uL (ref 4.22–5.81)
RDW: 14.6 % (ref 11.5–15.5)
WBC: 5.4 10*3/uL (ref 4.0–10.5)
nRBC: 0 % (ref 0.0–0.2)

## 2019-07-18 LAB — COMPREHENSIVE METABOLIC PANEL
ALT: 28 U/L (ref 0–44)
AST: 25 U/L (ref 15–41)
Albumin: 4.1 g/dL (ref 3.5–5.0)
Alkaline Phosphatase: 68 U/L (ref 38–126)
Anion gap: 11 (ref 5–15)
BUN: 12 mg/dL (ref 6–20)
CO2: 27 mmol/L (ref 22–32)
Calcium: 9 mg/dL (ref 8.9–10.3)
Chloride: 105 mmol/L (ref 98–111)
Creatinine, Ser: 1.08 mg/dL (ref 0.61–1.24)
GFR calc Af Amer: >60 mL/min (ref >60)
GFR calc non Af Amer: >60 mL/min (ref >60)
Glucose, Bld: 110 mg/dL — ABNORMAL HIGH (ref 70–99)
Potassium: 3.8 mmol/L (ref 3.5–5.1)
Sodium: 143 mmol/L (ref 135–145)
Total Bilirubin: 0.3 mg/dL (ref 0.3–1.2)
Total Protein: 6.9 g/dL (ref 6.5–8.1)

## 2019-07-18 LAB — TSH: TSH: 18.587 u[IU]/mL — ABNORMAL HIGH (ref 0.320–4.118)

## 2019-07-18 LAB — MAGNESIUM: Magnesium: 2 mg/dL (ref 1.7–2.4)

## 2019-07-18 MED ORDER — INFLUENZA VAC SPLIT QUAD 0.5 ML IM SUSY
PREFILLED_SYRINGE | INTRAMUSCULAR | Status: AC
  Filled 2019-07-18: qty 0.5

## 2019-07-18 MED ORDER — DEXAMETHASONE 4 MG PO TABS: 4.0000 mg | ORAL_TABLET | Freq: Every day | ORAL | 1 refills | Status: DC

## 2019-07-18 MED ORDER — SODIUM CHLORIDE 0.9% FLUSH
10.0000 mL | Freq: Once | INTRAVENOUS | Status: AC
  Administered 2019-07-18: 12:00:00 10 mL
  Filled 2019-07-18: qty 10

## 2019-07-18 MED ORDER — OXYCODONE HCL 10 MG PO TABS: 10.0000 mg | ORAL_TABLET | Freq: Three times a day (TID) | ORAL | 0 refills | Status: DC | PRN

## 2019-07-18 MED ORDER — INFLUENZA VAC SPLIT QUAD 0.5 ML IM SUSY
0.5000 mL | PREFILLED_SYRINGE | Freq: Once | INTRAMUSCULAR | Status: AC
  Administered 2019-07-18: 0.5 mL via INTRAMUSCULAR

## 2019-07-18 NOTE — Assessment & Plan Note (Signed)
He has history of severe, chemotherapy associated nausea and vomiting He is scheduled for daily IV fluids along with IV antiemetics I recommend small dose of daily dexamethasone after cisplatin treatment for anticipatory nausea

## 2019-07-18 NOTE — Assessment & Plan Note (Signed)
He has clinical signs of progression Previously, he had excellent response to treatment with cisplatin and gemcitabine He is ready to proceed with chemotherapy as scheduled this week Due to previous severe nausea and vomiting along with blood count issues, I plan to start him on lower dose of gemcitabine along with moderate dose of cisplatin on day 1 only He will get gemcitabine on day 1 and day 8 He understood that the goals of treatment is palliative in nature The risks, benefits, side effects of gemcitabine and cisplatin were discussed, including risk of pancytopenia, infection, severe nausea and vomiting, renal failure, etc. and he agreed to proceed The patient allow me to take pictures of his neck for future reference

## 2019-07-18 NOTE — Assessment & Plan Note (Signed)
We had numerous goals of care discussions in the past The patient understood that his treatment is strictly palliative in nature

## 2019-07-18 NOTE — Assessment & Plan Note (Signed)
He has worsening cancer associated pain I plan to increase the dose of oxycodone to 10 mg and will continue close follow-up with pain management

## 2019-07-18 NOTE — Telephone Encounter (Signed)
Called regarding abnormal TSH. He has been traveling and forgot his medication. He did not take synthroid for 6 days. Will resume tomorrow in the am.

## 2019-07-18 NOTE — Progress Notes (Signed)
[facility] OFFICE PROGRESS NOTE  Patient Care Team: Patient, No Pcp Per as PCP - General (General Practice) [name], MD as Consulting Physician (Hematology and Oncology) Eppie Gibson, MD as Attending Physician (Radiation Oncology) Jodi Marble, MD as Consulting Physician (Otolaryngology) Philomena Doheny, MD as Referring Physician (Plastic Surgery) Irene Shipper, MD as Consulting Physician (Gastroenterology) System, Provider Not In  ASSESSMENT & PLAN:  Primary cancer of head and neck [facility]) He has clinical signs of progression Previously, he had excellent response to treatment with cisplatin and gemcitabine He is ready to proceed with chemotherapy as scheduled this week Due to previous severe nausea and vomiting along with blood count issues, I plan to start him on lower dose of gemcitabine along with moderate dose of cisplatin on day 1 only He will get gemcitabine on day 1 and day 8 He understood that the goals of treatment is palliative in nature The risks, benefits, side effects of gemcitabine and cisplatin were discussed, including risk of pancytopenia, infection, severe nausea and vomiting, renal failure, etc. 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 has worsening cancer associated pain I plan to increase the dose of oxycodone to 10 mg and will continue close follow-up with pain management  Nausea and vomiting He has history of severe, chemotherapy associated nausea and vomiting He is scheduled for daily IV fluids along with IV antiemetics I recommend small dose of daily dexamethasone after cisplatin treatment for anticipatory nausea  Goals of care, counseling/discussion We had numerous goals of care discussions in the past The patient understood that his treatment is strictly palliative in nature   No orders of the defined types were placed in this encounter.   INTERVAL  HISTORY: Please see below for problem oriented charting. 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 Pan-endoscopy elsewhere showed cancer from nasopharyngeal space.   01/04/2007 - 02/20/2007 Chemotherapy   He received 2 cycles of cisplatin and 5FU followed by concurrent chemo with weekly cisplatin and radiation. He only received 2 doses of chemo due to severe mucositis, nausea and weight loss.   04/05/2007 - 08/04/2007 Chemotherapy   He received 4 more courses of cisplatin with 5FU and had complete response   07/05/2009 Procedure   Fine-needle aspirate of the right level II lymph nodes come from recurrent metastatic disease. Repeat endoscopy and CT scan show no evidence of disease elsewhere.   07/08/2009 - 12/02/2009 Chemotherapy   He was given 6 cycles of carboplatin, 5-FU and docetaxel   12/03/2009 Surgery   He has surgery to the residual lymph node on the right neck which showed no evidence of disease.   02/22/2012 Imaging  Repeat imaging study showed large recurrent mass. He was referred elsewhere for further treatment.   05/03/2012 Surgery   He underwent left upper lobectomy.   04/29/2013 Imaging   PEt scan showed lesion on right level II B and lower lung  was abnormal   06/03/2013 - 02/02/2014 Chemotherapy   He had 6 cycles of chemotherapy when he was found to have recurrence of cancer and had received oxaliplatin and capecitabine   06/07/2014 Imaging   PET CT scan showed persistent disease in the right neck lymph nodes and left lung   06/29/2014 Procedure   Accession: [id number] repeat LUL biopsy confirmed metastatic cancer   07/18/2014 - 07/31/2014 Radiation Therapy   He received palliative radiation therapy to the lungs   10/10/2014 Imaging   CT scan of the chest, abdomen and pelvis show regression in the size of the lung nodule in the left upper lobe and stable pulmonary nodules   01/24/2015 Imaging   CT scan showed stable disease in neck and lung   06/19/2015 Imaging   CT scan of the neck and the chest show possible mild progression of the nodule in the right side of the neck.   06/25/2015 Imaging   PET scan confirmed disease recurrence in the neck   07/07/2015 Imaging   He had MRI neck at [facility]   09/03/2015 - 08/26/2018 Chemotherapy   He received palliative chemo with Nivolumab   10/29/2015 Imaging   PET CT showed positive response to Rx   02/28/2016 Imaging   Ct abdomen showed abnormal thinkening in his stomach   03/03/2016 Imaging   CT: Right sternocleidomastoid muscle metastasis appears less distinct but otherwise not significantly changed in size or configuration since 06/19/2015.2. Left level 3 lymph node which was hypermetabolic by PET-CT in January 2017 appears slightly smaller   04/01/2016 Imaging   CT cervical spine showed no acute fracture or traumatic malalignment in the cervical spine   04/22/2016 Procedure   Port-a-cath placed.   06/16/2016 Imaging   Ct neck showed right sternocleidomastoid muscle metastasis is further decreased in conspicuity since May, and has mildly decreased in size since September 2016. Continued stability of sub-centimeter left cervical lymph nodes. No new or progressive metastatic disease in the  neck.   06/16/2016 Imaging   CT chest showed stable masslike radiation fibrosis in the left upper lobe. Stable subcentimeter pulmonary nodules in the bilateral lower lobes. No new or progressive metastatic disease in the chest. Nonobstructing left renal stone.   10/13/2016 Imaging   Ct neck showed unchanged right sternocleidomastoid muscle metastasis. Unchanged subcentimeter left cervical lymph nodes. No evidence of new or progressive metastatic disease in the neck.   10/13/2016 Imaging   CT chest showed tiny hypervascular foci in the liver, not definitely seen on prior imaging of 06/16/2016 and 02/28/2016. Abdomen MRI without and with contrast recommended to further evaluate as metastatic disease is a concern. 2. Stable appearance of post treatment changes left upper lung and scattered tiny bilateral pulmonary nodules.   02/11/2017 Imaging   Ct neck: Lymph node mass right posterior neck appears improved from the prior study. Small posterior lymph nodes on the left unchanged. Occluded right jugular vein unchanged.   02/11/2017 Imaging   1. Similar appearance of postsurgical and radiation changes in the left upper lobe. 2. Similar bilateral pulmonary nodules. 3. No thoracic adenopathy. 4. Subtle foci of post-contrast enhancement within the liver are suboptimally characterized on this nondedicated study. Likely similar. These could either be re-evaluated at followup or  more entirely characterized with abdominal MRI. 5. Left nephrolithiasis.   05/19/2017 Imaging   Matted lymph node mass right posterior neck appears larger in the recent CT. Accurate measurements difficult due to infiltrating tumor margins and infiltration of the muscle. Right jugular vein again appears occluded or resected. Small left posterior lymph nodes stable. Left upper lobe airspace density stable and similar to the prior CT   06/03/2017 PET scan   1. Hypermetabolic ill-defined right level IIb lymph node, about 1.3 cm in diameter  with maximum SUV 9.5 (formerly 8.1). Appearance suspicious for residual/recurrent malignancy. No worrisome left-sided lesion. 2. Left suprahilar indistinct opacity demonstrates no worrisome hypermetabolic activity. The 5 mm left lower lobe pulmonary nodule is stable and not currently hypermetabolic although below sensitive PET-CT size thresholds. 3. Other imaging findings of potential clinical significance: Bilateral nonobstructive nephrolithiasis. Chronic bilateral maxillary sinusitis.   05/11/2018 PET scan   1. Continued chronic accentuated metabolic activity in the vicinity of right level IIB and the adjacent right sternocleidomastoid muscle, with ill definition of surrounding tissue planes. Maximum SUV is currently 8.1, formerly 9.5. Accentuated metabolic activity is been present in this vicinity back through 06/25/2015, and there was also some low-level activity in this vicinity on 06/07/2014. Some of this may be from scarring and local muscular activity although clearly a component of residual tumor is difficult to exclude given the focally high activity. 2. Other imaging findings of potential clinical significance: Chronic bilateral maxillary sinusitis. Chronic scarring in the left upper lobe. Chronically stable 5 mm left lower lobe nodule is considered benign. Nonobstructive left nephrolithiasis.   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 COMMENT:No significant keratinization is identified. Other basaloid carcinomas are in the differential diagnosis. No cell block material is available for further testing.   09/12/2018 Procedure   He underwent fine Needle Aspiration   10/04/2018 PET scan   1. Significant progression of local recurrence laterally in the mid right neck with an enlarging, increasingly hypermetabolic soft tissue mass. This involves  the right sternocleidomastoid muscle. 2. Small lymph nodes in the right axilla are increasingly hypermetabolic. These are nonspecific and potentially reactive, although could reflect a small metastases. Small hypermetabolic nodule in the left suprasternal notch is unchanged. 3. No other evidence of metastatic disease.    10/07/2018 - 12/23/2018 Chemotherapy   The patient had cisplatin plus gemzar   12/07/2018 Imaging   1. Decreased size of lateral right neck mass. 2. Unchanged soft tissue nodule in the suprasternal notch. 3. No evidence of new metastatic disease in the neck.    05/30/2019 Imaging   CT neck No clear change or progression compared to the study of March. Overall measurements of the right lateral neck mass are similar, approximately 3 x 1.8 cm. See above discussion. One could argue that there is slight increase in lateral bulging, possibly with an increase in contrast enhancement, towards the inferior margin. This is of questionable validity but could possibly represent some progression or inflammatory change. Other findings in the region are stable.   07/20/2019 -  Chemotherapy   The patient had palonosetron (ALOXI) injection 0.25 mg, 0.25 mg, Intravenous,  Once, 0 of 4 cycles CISplatin (PLATINOL) 84 mg in sodium chloride 0.9 % 250 mL chemo infusion, 40 mg/m2 = 84 mg (80 % of original dose 50 mg/m2), Intravenous,  Once, 0 of 4 cycles Dose modification: 40 mg/m2 (80 % of  original dose 50 mg/m2, Cycle 1, Reason: Dose Not Tolerated) gemcitabine (GEMZAR) 1,672 mg in sodium chloride 0.9 % 250 mL chemo infusion, 800 mg/m2 = 1,672 mg (80 % of original dose 1,000 mg/m2), Intravenous,  Once, 0 of 4 cycles Dose modification: 800 mg/m2 (80 % of original dose 1,000 mg/m2, Cycle 1, Reason: Provider Judgment) ondansetron (ZOFRAN) 8 mg, dexamethasone (DECADRON) 10 mg in sodium chloride 0.9 % 50 mL IVPB, , Intravenous,  Once, 0 of 4 cycles fosaprepitant (EMEND) 150 mg, dexamethasone (DECADRON) 12  mg in sodium chloride 0.9 % 145 mL IVPB, , Intravenous,  Once, 0 of 4 cycles  for chemotherapy treatment.      REVIEW OF SYSTEMS:   Constitutional: Denies fevers, chills or abnormal weight loss Eyes: Denies blurriness of vision Ears, nose, mouth, throat, and face: Denies mucositis or sore throat Respiratory: Denies cough, dyspnea or wheezes Cardiovascular: Denies palpitation, chest discomfort or lower extremity swelling Lymphatics: Denies new lymphadenopathy or easy bruising Neurological:Denies numbness, tingling or new weaknesses Behavioral/Psych: Mood is stable, no new changes  All other systems were reviewed with the patient and are negative.  I have reviewed the past medical history, past surgical history, social history and family history with the patient and they are unchanged from previous note.  ALLERGIES:  is allergic to phenergan [promethazine hcl]; heparin; and clindamycin.  MEDICATIONS:  Current Outpatient Medications  Medication Sig Dispense Refill  . cetirizine (ZYRTEC) 10 MG tablet Take 10 mg by mouth daily.    Marland Kitchen dexamethasone (DECADRON) 4 MG tablet Take 1 tablet (4 mg total) by mouth daily. 30 tablet 1  . levothyroxine (SYNTHROID, LEVOTHROID) 137 MCG tablet TAKE 1 TABLET(137 MCG) BY MOUTH DAILY BEFORE BREAKFAST (Patient taking differently: Take 137 mcg by mouth daily before breakfast. ) 90 tablet 0  . lidocaine (XYLOCAINE) 2 % solution Use as directed 5 mLs in the mouth or throat every 3 (three) hours as needed for mouth pain. Swish, gargle and spit 100 mL 2  . LORazepam (ATIVAN) 1 MG tablet Take 1 tablet (1 mg total) by mouth every 8 (eight) hours as needed for anxiety. 30 tablet 3  . magic mouthwash SOLN Take 10 mLs by mouth 4 (four) times daily as needed for mouth pain. Swish and swallow or spit 240 mL 2  . metFORMIN (GLUCOPHAGE) 500 MG tablet TAKE 1 TABLET(500 MG) BY MOUTH DAILY WITH BREAKFAST 90 tablet 1  . mirtazapine (REMERON) 15 MG tablet Take 1 tablet (15 mg  total) by mouth at bedtime. 30 tablet 1  . omeprazole (PRILOSEC) 20 MG capsule Take 1 capsule (20 mg total) by mouth daily. 30 capsule 5  . ondansetron (ZOFRAN) 8 MG tablet Take 1 tablet (8 mg total) by mouth every 8 (eight) hours as needed for nausea or vomiting. 90 tablet 1  . oxyCODONE 10 MG TABS Take 1 tablet (10 mg total) by mouth every 8 (eight) hours as needed. 60 tablet 0  . PARoxetine (PAXIL) 20 MG tablet Take 1 tablet (20 mg total) by mouth daily. 30 tablet 5  . polyethylene glycol (MIRALAX) packet Take 17 g by mouth daily. (Patient taking differently: Take 17 g by mouth daily as needed for mild constipation or moderate constipation. ) 14 each 3  . prochlorperazine (COMPAZINE) 10 MG tablet Take 1 tablet (10 mg total) by mouth every 12 (twelve) hours as needed (Nausea or vomiting). 30 tablet 1  . senna-docusate (SENOKOT-S) 8.6-50 MG tablet Take 2 tablets by mouth 3 (three) times daily. 90 tablet  0   No current facility-administered medications for this visit.     PHYSICAL EXAMINATION: ECOG PERFORMANCE STATUS: 1 - Symptomatic but completely ambulatory  Vitals:   07/18/19 1300  BP: 129/90  Pulse: 78  Resp: 18  Temp: 98.3 F (36.8 C)  SpO2: 100%   Filed Weights   07/18/19 1300  Weight: 198 lb (89.8 kg)    GENERAL:alert, no distress and comfortable SKIN: skin color, texture, turgor are normal, no rashes or significant lesions EYES: normal, Conjunctiva are pink and non-injected, sclera clear OROPHARYNX:no exudate, no erythema and lips, buccal mucosa, and tongue normal  NECK: There is a nodule on the right side of the neck, compatible with disease recurrence.  Please see picture below.  Neck is thick and fibrosed from prior surgery and radiation LYMPH:  no palpable lymphadenopathy in the cervical, axillary or inguinal LUNGS: clear to auscultation and percussion with normal breathing effort HEART: regular rate & rhythm and no murmurs and no lower extremity edema ABDOMEN:abdomen  soft, non-tender and normal bowel sounds Musculoskeletal:no cyanosis of digits and no clubbing  NEURO: alert & oriented x 3 with fluent speech, no focal motor/sensory deficits  LABORATORY DATA:  I have reviewed the data as listed    Component Value Date/Time   NA 143 07/18/2019 1230   NA 139 09/22/2017 0829   K 3.8 07/18/2019 1230   K 3.5 09/22/2017 0829   CL 105 07/18/2019 1230   CO2 27 07/18/2019 1230   CO2 26 09/22/2017 0829   GLUCOSE 110 (H) 07/18/2019 1230   GLUCOSE 133 09/22/2017 0829   BUN 12 07/18/2019 1230   BUN 14.1 09/22/2017 0829   CREATININE 1.08 07/18/2019 1230   CREATININE 0.93 02/06/2019 1215   CREATININE 0.9 09/22/2017 0829   CALCIUM 9.0 07/18/2019 1230   CALCIUM 9.1 09/22/2017 0829   PROT 6.9 07/18/2019 1230   PROT 6.8 09/22/2017 0829   ALBUMIN 4.1 07/18/2019 1230   ALBUMIN 4.1 09/22/2017 0829   AST 25 07/18/2019 1230   AST 31 02/06/2019 1215   AST 22 09/22/2017 0829   ALT 28 07/18/2019 1230   ALT 48 (H) 02/06/2019 1215   ALT 30 09/22/2017 0829   ALKPHOS 68 07/18/2019 1230   ALKPHOS 55 09/22/2017 0829   BILITOT 0.3 07/18/2019 1230   BILITOT 0.2 (L) 02/06/2019 1215   BILITOT 0.35 09/22/2017 0829   GFRNONAA >60 07/18/2019 1230   GFRNONAA >60 02/06/2019 1215   GFRAA >60 07/18/2019 1230   GFRAA >60 02/06/2019 1215    No results found for: SPEP, UPEP  Lab Results  Component Value Date   WBC 5.4 07/18/2019   NEUTROABS 3.2 07/18/2019   HGB 13.7 07/18/2019   HCT 41.6 07/18/2019   MCV 87.6 07/18/2019   PLT 223 07/18/2019      Chemistry      Component Value Date/Time   NA 143 07/18/2019 1230   NA 139 09/22/2017 0829   K 3.8 07/18/2019 1230   K 3.5 09/22/2017 0829   CL 105 07/18/2019 1230   CO2 27 07/18/2019 1230   CO2 26 09/22/2017 0829   BUN 12 07/18/2019 1230   BUN 14.1 09/22/2017 0829   CREATININE 1.08 07/18/2019 1230   CREATININE 0.93 02/06/2019 1215   CREATININE 0.9 09/22/2017 0829      Component Value Date/Time   CALCIUM 9.0  07/18/2019 1230   CALCIUM 9.1 09/22/2017 0829   ALKPHOS 68 07/18/2019 1230   ALKPHOS 55 09/22/2017 0829   AST 25 07/18/2019 1230  AST 31 02/06/2019 1215   AST 22 09/22/2017 0829   ALT 28 07/18/2019 1230   ALT 48 (H) 02/06/2019 1215   ALT 30 09/22/2017 0829   BILITOT 0.3 07/18/2019 1230   BILITOT 0.2 (L) 02/06/2019 1215   BILITOT 0.35 09/22/2017 0829         All questions were answered. The patient knows to call the clinic with any problems, questions or concerns. No barriers to learning was detected.  I spent 25 minutes counseling the patient face to face. The total time spent in the appointment was 30 minutes and more than 50% was on counseling and review of test results  [name], MD 07/18/2019 1:20 PM

## 2019-07-18 NOTE — Patient Instructions (Signed)
Influenza Virus Vaccine injection What is this medicine? INFLUENZA VIRUS VACCINE (in floo EN zuh VAHY ruhs vak SEEN) helps to reduce the risk of getting influenza also known as the flu. The vaccine only helps protect you against some strains of the flu. This medicine may be used for other purposes; ask your health care provider or pharmacist if you have questions. COMMON BRAND NAME(S): Afluria, Afluria Quadrivalent, Agriflu, Alfuria, FLUAD, Fluarix, Fluarix Quadrivalent, Flublok, Flublok Quadrivalent, FLUCELVAX, Flulaval, Fluvirin, Fluzone, Fluzone High-Dose, Fluzone Intradermal What should I tell my health care provider before I take this medicine? They need to know if you have any of these conditions:  bleeding disorder like hemophilia  fever or infection  Guillain-Barre syndrome or other neurological problems  immune system problems  infection with the human immunodeficiency virus (HIV) or AIDS  low blood platelet counts  multiple sclerosis  an unusual or allergic reaction to influenza virus vaccine, latex, other medicines, foods, dyes, or preservatives. Different brands of vaccines contain different allergens. Some may contain latex or eggs. Talk to your doctor about your allergies to make sure that you get the right vaccine.  pregnant or trying to get pregnant  breast-feeding How should I use this medicine? This vaccine is for injection into a muscle or under the skin. It is given by a health care professional. A copy of Vaccine Information Statements will be given before each vaccination. Read this sheet carefully each time. The sheet may change frequently. Talk to your healthcare provider to see which vaccines are right for you. Some vaccines should not be used in all age groups. Overdosage: If you think you have taken too much of this medicine contact a poison control center or emergency room at once. NOTE: This medicine is only for you. Do not share this medicine with  others. What if I miss a dose? This does not apply. What may interact with this medicine?  chemotherapy or radiation therapy  medicines that lower your immune system like etanercept, anakinra, infliximab, and adalimumab  medicines that treat or prevent blood clots like warfarin  phenytoin  steroid medicines like prednisone or cortisone  theophylline  vaccines This list may not describe all possible interactions. Give your health care provider a list of all the medicines, herbs, non-prescription drugs, or dietary supplements you use. Also tell them if you smoke, drink alcohol, or use illegal drugs. Some items may interact with your medicine. What should I watch for while using this medicine? Report any side effects that do not go away within 3 days to your doctor or health care professional. Call your health care provider if any unusual symptoms occur within 6 weeks of receiving this vaccine. You may still catch the flu, but the illness is not usually as bad. You cannot get the flu from the vaccine. The vaccine will not protect against colds or other illnesses that may cause fever. The vaccine is needed every year. What side effects may I notice from receiving this medicine? Side effects that you should report to your doctor or health care professional as soon as possible:  allergic reactions like skin rash, itching or hives, swelling of the face, lips, or tongue Side effects that usually do not require medical attention (report to your doctor or health care professional if they continue or are bothersome):  fever  headache  muscle aches and pains  pain, tenderness, redness, or swelling at the injection site  tiredness This list may not describe all possible side effects. Call your   doctor for medical advice about side effects. You may report side effects to FDA at 1-800-FDA-1088. Where should I keep my medicine? The vaccine will be given by a health care professional in a  clinic, pharmacy, doctor's office, or other health care setting. You will not be given vaccine doses to store at home. NOTE: This sheet is a summary. It may not cover all possible information. If you have questions about this medicine, talk to your doctor, pharmacist, or health care provider.  2020 Elsevier/Gold Standard (2018-08-16 08:45:43)  

## 2019-07-20 ENCOUNTER — Inpatient Hospital Stay: Payer: Medicaid Other

## 2019-07-20 ENCOUNTER — Other Ambulatory Visit: Payer: Self-pay

## 2019-07-20 VITALS — BP 144/76 | HR 115 | Temp 99.1°F | Resp 18

## 2019-07-20 DIAGNOSIS — C760 Malignant neoplasm of head, face and neck: Secondary | ICD-10-CM

## 2019-07-20 DIAGNOSIS — Z7189 Other specified counseling: Secondary | ICD-10-CM

## 2019-07-20 DIAGNOSIS — Z5111 Encounter for antineoplastic chemotherapy: Secondary | ICD-10-CM | POA: Diagnosis not present

## 2019-07-20 MED ORDER — PALONOSETRON HCL INJECTION 0.25 MG/5ML
INTRAVENOUS | Status: AC
  Filled 2019-07-20: qty 5

## 2019-07-20 MED ORDER — SODIUM CHLORIDE 0.9 % IV SOLN
Freq: Once | INTRAVENOUS | Status: AC
  Administered 2019-07-20: 10:00:00 via INTRAVENOUS
  Filled 2019-07-20: qty 250

## 2019-07-20 MED ORDER — ANTICOAGULANT SODIUM CITRATE 4% (200MG/5ML) IV SOLN
5.0000 mL | Freq: Once | Status: AC
  Administered 2019-07-20: 5 mL via INTRAVENOUS
  Filled 2019-07-20: qty 5

## 2019-07-20 MED ORDER — POTASSIUM CHLORIDE 2 MEQ/ML IV SOLN
Freq: Once | INTRAVENOUS | Status: AC
  Administered 2019-07-20: 10:00:00 via INTRAVENOUS
  Filled 2019-07-20: qty 10

## 2019-07-20 MED ORDER — PALONOSETRON HCL INJECTION 0.25 MG/5ML
0.2500 mg | Freq: Once | INTRAVENOUS | Status: AC
  Administered 2019-07-20: 0.25 mg via INTRAVENOUS

## 2019-07-20 MED ORDER — SODIUM CHLORIDE 0.9% FLUSH
10.0000 mL | INTRAVENOUS | Status: DC | PRN
  Administered 2019-07-20: 10 mL
  Filled 2019-07-20: qty 10

## 2019-07-20 MED ORDER — SODIUM CHLORIDE 0.9 % IV SOLN
40.0000 mg/m2 | Freq: Once | INTRAVENOUS | Status: AC
  Administered 2019-07-20: 84 mg via INTRAVENOUS
  Filled 2019-07-20: qty 84

## 2019-07-20 MED ORDER — SODIUM CHLORIDE 0.9 % IV SOLN
1600.0000 mg | Freq: Once | INTRAVENOUS | Status: AC
  Administered 2019-07-20: 13:00:00 1600 mg via INTRAVENOUS
  Filled 2019-07-20: qty 42.08

## 2019-07-20 MED ORDER — SODIUM CHLORIDE 0.9 % IV SOLN
Freq: Once | INTRAVENOUS | Status: AC
  Administered 2019-07-20: 12:00:00 via INTRAVENOUS
  Filled 2019-07-20: qty 5

## 2019-07-20 NOTE — Patient Instructions (Signed)
[facility] Discharge Instructions for Patients Receiving Chemotherapy  Today you received the following chemotherapy agents: Gemcitabine (Gemzar) and Cisplatin (Platinol)  To help prevent nausea and vomiting after your treatment, we encourage you to take your nausea medication as directed. Received Aloxi during treatment today-->Take your Compazine prescription (not Zofran) for the next 3 days as needed.    If you develop nausea and vomiting that is not controlled by your nausea medication, call the clinic.   BELOW ARE SYMPTOMS THAT SHOULD BE REPORTED IMMEDIATELY:  *FEVER GREATER THAN 100.5 F  *CHILLS WITH OR WITHOUT FEVER  NAUSEA AND VOMITING THAT IS NOT CONTROLLED WITH YOUR NAUSEA MEDICATION  *UNUSUAL SHORTNESS OF BREATH  *UNUSUAL BRUISING OR BLEEDING  TENDERNESS IN MOUTH AND THROAT WITH OR WITHOUT PRESENCE OF ULCERS  *URINARY PROBLEMS  *BOWEL PROBLEMS  UNUSUAL RASH Items with * indicate a potential emergency and should be followed up as soon as possible.  Feel free to call the clinic should you have any questions or concerns. The clinic phone number is [phone].  Please show the [facility] at check-in to the Emergency Department and triage nurse.

## 2019-07-20 NOTE — Progress Notes (Signed)
Per Dr. Burr Medico OK to treat today with pulse of 115

## 2019-07-21 ENCOUNTER — Inpatient Hospital Stay: Payer: Medicaid Other

## 2019-07-21 ENCOUNTER — Other Ambulatory Visit: Payer: Self-pay

## 2019-07-21 VITALS — BP 129/68 | HR 83 | Temp 99.1°F | Resp 17

## 2019-07-21 DIAGNOSIS — Z5111 Encounter for antineoplastic chemotherapy: Secondary | ICD-10-CM | POA: Diagnosis not present

## 2019-07-21 DIAGNOSIS — C7800 Secondary malignant neoplasm of unspecified lung: Secondary | ICD-10-CM

## 2019-07-21 DIAGNOSIS — Z95828 Presence of other vascular implants and grafts: Secondary | ICD-10-CM

## 2019-07-21 DIAGNOSIS — C760 Malignant neoplasm of head, face and neck: Secondary | ICD-10-CM

## 2019-07-21 MED ORDER — ANTICOAGULANT SODIUM CITRATE 4% (200MG/5ML) IV SOLN
5.0000 mL | Freq: Once | Status: AC
  Administered 2019-07-21: 5 mL
  Filled 2019-07-21 (×2): qty 5

## 2019-07-21 MED ORDER — SODIUM CHLORIDE 0.9 % IV SOLN
Freq: Once | INTRAVENOUS | Status: AC
  Administered 2019-07-21: 09:00:00 via INTRAVENOUS
  Filled 2019-07-21: qty 250

## 2019-07-21 MED ORDER — ONDANSETRON HCL 4 MG/2ML IJ SOLN
INTRAMUSCULAR | Status: AC
  Filled 2019-07-21: qty 4

## 2019-07-21 MED ORDER — ANTICOAGULANT SODIUM CITRATE 4% (200MG/5ML) IV SOLN
5.0000 mL | Freq: Once | Status: AC
  Administered 2019-07-22: 5 mL
  Filled 2019-07-21: qty 5

## 2019-07-21 MED ORDER — SODIUM CHLORIDE 0.9% FLUSH
10.0000 mL | Freq: Once | INTRAVENOUS | Status: AC
  Administered 2019-07-21: 10 mL
  Filled 2019-07-21: qty 10

## 2019-07-21 MED ORDER — ONDANSETRON HCL 4 MG/2ML IJ SOLN
8.0000 mg | Freq: Once | INTRAMUSCULAR | Status: AC
  Administered 2019-07-21: 8 mg via INTRAVENOUS

## 2019-07-21 NOTE — Patient Instructions (Signed)
Dehydration, Adult  Dehydration is when there is not enough fluid or water in your body. This happens when you lose more fluids than you take in. Dehydration can range from mild to very bad. It should be treated right away to keep it from getting very bad. Symptoms of mild dehydration may include:  Thirst.  Dry lips.  Slightly dry mouth.  Dry, warm skin.  Dizziness. Symptoms of moderate dehydration may include:  Very dry mouth.  Muscle cramps.  Dark pee (urine). Pee may be the color of tea.  Your body making less pee.  Your eyes making fewer tears.  Heartbeat that is uneven or faster than normal (palpitations).  Headache.  Light-headedness, especially when you stand up from sitting.  Fainting (syncope). Symptoms of very bad dehydration may include:  Changes in skin, such as: ? Cold and clammy skin. ? Blotchy (mottled) or pale skin. ? Skin that does not quickly return to normal after being lightly pinched and let go (poor skin turgor).  Changes in body fluids, such as: ? Feeling very thirsty. ? Your eyes making fewer tears. ? Not sweating when body temperature is high, such as in hot weather. ? Your body making very little pee.  Changes in vital signs, such as: ? Weak pulse. ? Pulse that is more than 100 beats a minute when you are sitting still. ? Fast breathing. ? Low blood pressure.  Other changes, such as: ? Sunken eyes. ? Cold hands and feet. ? Confusion. ? Lack of energy (lethargy). ? Trouble waking up from sleep. ? Short-term weight loss. ? Unconsciousness. Follow these instructions at home:   If told by your doctor, drink an ORS: ? Make an ORS by using instructions on the package. ? Start by drinking small amounts, about  cup (120 mL) every 5-10 minutes. ? Slowly drink more until you have had the amount that your doctor said to have.  Drink enough clear fluid to keep your pee clear or pale yellow. If you were told to drink an ORS, finish the  ORS first, then start slowly drinking clear fluids. Drink fluids such as: ? Water. Do not drink only water by itself. Doing that can make the salt (sodium) level in your body get too low (hyponatremia). ? Ice chips. ? Fruit juice that you have added water to (diluted). ? Low-calorie sports drinks.  Avoid: ? Alcohol. ? Drinks that have a lot of sugar. These include high-calorie sports drinks, fruit juice that does not have water added, and soda. ? Caffeine. ? Foods that are greasy or have a lot of fat or sugar.  Take over-the-counter and prescription medicines only as told by your doctor.  Do not take salt tablets. Doing that can make the salt level in your body get too high (hypernatremia).  Eat foods that have minerals (electrolytes). Examples include bananas, oranges, potatoes, tomatoes, and spinach.  Keep all follow-up visits as told by your doctor. This is important. Contact a doctor if:  You have belly (abdominal) pain that: ? Gets worse. ? Stays in one area (localizes).  You have a rash.  You have a stiff neck.  You get angry or annoyed more easily than normal (irritability).  You are more sleepy than normal.  You have a harder time waking up than normal.  You feel: ? Weak. ? Dizzy. ? Very thirsty.  You have peed (urinated) only a small amount of very dark pee during 6-8 hours. Get help right away if:  You have   symptoms of very bad dehydration.  You cannot drink fluids without throwing up (vomiting).  Your symptoms get worse with treatment.  You have a fever.  You have a very bad headache.  You are throwing up or having watery poop (diarrhea) and it: ? Gets worse. ? Does not go away.  You have blood or something green (bile) in your throw-up.  You have blood in your poop (stool). This may cause poop to look black and tarry.  You have not peed in 6-8 hours.  You pass out (faint).  Your heart rate when you are sitting still is more than 100 beats a  minute.  You have trouble breathing. This information is not intended to replace advice given to you by your health care provider. Make sure you discuss any questions you have with your health care provider. Document Released: 07/18/2009 Document Revised: 09/03/2017 Document Reviewed: 11/15/2015 Elsevier Patient Education  2020 [employer] Inc.  

## 2019-07-22 ENCOUNTER — Other Ambulatory Visit: Payer: Self-pay

## 2019-07-22 ENCOUNTER — Inpatient Hospital Stay: Payer: Medicaid Other

## 2019-07-22 VITALS — BP 138/77 | HR 71 | Temp 98.3°F | Resp 16

## 2019-07-22 DIAGNOSIS — Z95828 Presence of other vascular implants and grafts: Secondary | ICD-10-CM

## 2019-07-22 DIAGNOSIS — C760 Malignant neoplasm of head, face and neck: Secondary | ICD-10-CM

## 2019-07-22 DIAGNOSIS — C7800 Secondary malignant neoplasm of unspecified lung: Secondary | ICD-10-CM

## 2019-07-22 DIAGNOSIS — Z5111 Encounter for antineoplastic chemotherapy: Secondary | ICD-10-CM | POA: Diagnosis not present

## 2019-07-22 MED ORDER — SODIUM CHLORIDE 0.9 % IV SOLN
Freq: Once | INTRAVENOUS | Status: AC
  Administered 2019-07-22: 08:00:00 via INTRAVENOUS
  Filled 2019-07-22: qty 250

## 2019-07-22 MED ORDER — ONDANSETRON HCL 4 MG/2ML IJ SOLN
INTRAMUSCULAR | Status: AC
  Filled 2019-07-22: qty 4

## 2019-07-22 MED ORDER — SODIUM CHLORIDE 0.9% FLUSH
10.0000 mL | Freq: Once | INTRAVENOUS | Status: AC | PRN
  Administered 2019-07-22: 10:00:00 10 mL
  Filled 2019-07-22: qty 10

## 2019-07-22 MED ORDER — ONDANSETRON HCL 4 MG/2ML IJ SOLN
8.0000 mg | Freq: Once | INTRAMUSCULAR | Status: AC
  Administered 2019-07-22: 8 mg via INTRAVENOUS

## 2019-07-22 NOTE — Patient Instructions (Signed)
Dehydration, Adult  Dehydration is when there is not enough fluid or water in your body. This happens when you lose more fluids than you take in. Dehydration can range from mild to very bad. It should be treated right away to keep it from getting very bad. Symptoms of mild dehydration may include:  Thirst.  Dry lips.  Slightly dry mouth.  Dry, warm skin.  Dizziness. Symptoms of moderate dehydration may include:  Very dry mouth.  Muscle cramps.  Dark pee (urine). Pee may be the color of tea.  Your body making less pee.  Your eyes making fewer tears.  Heartbeat that is uneven or faster than normal (palpitations).  Headache.  Light-headedness, especially when you stand up from sitting.  Fainting (syncope). Symptoms of very bad dehydration may include:  Changes in skin, such as: ? Cold and clammy skin. ? Blotchy (mottled) or pale skin. ? Skin that does not quickly return to normal after being lightly pinched and let go (poor skin turgor).  Changes in body fluids, such as: ? Feeling very thirsty. ? Your eyes making fewer tears. ? Not sweating when body temperature is high, such as in hot weather. ? Your body making very little pee.  Changes in vital signs, such as: ? Weak pulse. ? Pulse that is more than 100 beats a minute when you are sitting still. ? Fast breathing. ? Low blood pressure.  Other changes, such as: ? Sunken eyes. ? Cold hands and feet. ? Confusion. ? Lack of energy (lethargy). ? Trouble waking up from sleep. ? Short-term weight loss. ? Unconsciousness. Follow these instructions at home:   If told by your doctor, drink an ORS: ? Make an ORS by using instructions on the package. ? Start by drinking small amounts, about  cup (120 mL) every 5-10 minutes. ? Slowly drink more until you have had the amount that your doctor said to have.  Drink enough clear fluid to keep your pee clear or pale yellow. If you were told to drink an ORS, finish the  ORS first, then start slowly drinking clear fluids. Drink fluids such as: ? Water. Do not drink only water by itself. Doing that can make the salt (sodium) level in your body get too low (hyponatremia). ? Ice chips. ? Fruit juice that you have added water to (diluted). ? Low-calorie sports drinks.  Avoid: ? Alcohol. ? Drinks that have a lot of sugar. These include high-calorie sports drinks, fruit juice that does not have water added, and soda. ? Caffeine. ? Foods that are greasy or have a lot of fat or sugar.  Take over-the-counter and prescription medicines only as told by your doctor.  Do not take salt tablets. Doing that can make the salt level in your body get too high (hypernatremia).  Eat foods that have minerals (electrolytes). Examples include bananas, oranges, potatoes, tomatoes, and spinach.  Keep all follow-up visits as told by your doctor. This is important. Contact a doctor if:  You have belly (abdominal) pain that: ? Gets worse. ? Stays in one area (localizes).  You have a rash.  You have a stiff neck.  You get angry or annoyed more easily than normal (irritability).  You are more sleepy than normal.  You have a harder time waking up than normal.  You feel: ? Weak. ? Dizzy. ? Very thirsty.  You have peed (urinated) only a small amount of very dark pee during 6-8 hours. Get help right away if:  You have   symptoms of very bad dehydration.  You cannot drink fluids without throwing up (vomiting).  Your symptoms get worse with treatment.  You have a fever.  You have a very bad headache.  You are throwing up or having watery poop (diarrhea) and it: ? Gets worse. ? Does not go away.  You have blood or something green (bile) in your throw-up.  You have blood in your poop (stool). This may cause poop to look black and tarry.  You have not peed in 6-8 hours.  You pass out (faint).  Your heart rate when you are sitting still is more than 100 beats a  minute.  You have trouble breathing. This information is not intended to replace advice given to you by your health care provider. Make sure you discuss any questions you have with your health care provider. Document Released: 07/18/2009 Document Revised: 09/03/2017 Document Reviewed: 11/15/2015 Elsevier Patient Education  2020 [employer] Inc.  

## 2019-07-24 ENCOUNTER — Inpatient Hospital Stay: Payer: Medicaid Other

## 2019-07-24 ENCOUNTER — Other Ambulatory Visit: Payer: Self-pay

## 2019-07-25 ENCOUNTER — Other Ambulatory Visit: Payer: Self-pay

## 2019-07-25 ENCOUNTER — Other Ambulatory Visit: Payer: Self-pay | Admitting: Hematology and Oncology

## 2019-07-25 ENCOUNTER — Inpatient Hospital Stay (HOSPITAL_BASED_OUTPATIENT_CLINIC_OR_DEPARTMENT_OTHER): Payer: Medicaid Other | Admitting: Hematology and Oncology

## 2019-07-25 ENCOUNTER — Inpatient Hospital Stay: Payer: Medicaid Other

## 2019-07-25 ENCOUNTER — Telehealth: Payer: Self-pay | Admitting: *Deleted

## 2019-07-25 ENCOUNTER — Encounter: Payer: Self-pay | Admitting: Hematology and Oncology

## 2019-07-25 VITALS — BP 117/77 | HR 86 | Temp 98.0°F | Resp 18 | Ht 69.0 in | Wt 191.0 lb

## 2019-07-25 DIAGNOSIS — Z95828 Presence of other vascular implants and grafts: Secondary | ICD-10-CM

## 2019-07-25 DIAGNOSIS — G893 Neoplasm related pain (acute) (chronic): Secondary | ICD-10-CM | POA: Diagnosis not present

## 2019-07-25 DIAGNOSIS — C760 Malignant neoplasm of head, face and neck: Secondary | ICD-10-CM

## 2019-07-25 DIAGNOSIS — C7800 Secondary malignant neoplasm of unspecified lung: Secondary | ICD-10-CM | POA: Diagnosis not present

## 2019-07-25 DIAGNOSIS — C119 Malignant neoplasm of nasopharynx, unspecified: Secondary | ICD-10-CM

## 2019-07-25 DIAGNOSIS — E039 Hypothyroidism, unspecified: Secondary | ICD-10-CM

## 2019-07-25 DIAGNOSIS — Z5111 Encounter for antineoplastic chemotherapy: Secondary | ICD-10-CM | POA: Diagnosis not present

## 2019-07-25 LAB — COMPREHENSIVE METABOLIC PANEL
ALT: 27 U/L (ref 0–44)
AST: 20 U/L (ref 15–41)
Albumin: 3.9 g/dL (ref 3.5–5.0)
Alkaline Phosphatase: 76 U/L (ref 38–126)
Anion gap: 12 (ref 5–15)
BUN: 17 mg/dL (ref 6–20)
CO2: 24 mmol/L (ref 22–32)
Calcium: 9 mg/dL (ref 8.9–10.3)
Chloride: 102 mmol/L (ref 98–111)
Creatinine, Ser: 0.84 mg/dL (ref 0.61–1.24)
GFR calc Af Amer: >60 mL/min (ref >60)
GFR calc non Af Amer: >60 mL/min (ref >60)
Glucose, Bld: 142 mg/dL — ABNORMAL HIGH (ref 70–99)
Potassium: 3.6 mmol/L (ref 3.5–5.1)
Sodium: 138 mmol/L (ref 135–145)
Total Bilirubin: 0.5 mg/dL (ref 0.3–1.2)
Total Protein: 7 g/dL (ref 6.5–8.1)

## 2019-07-25 LAB — CBC WITH DIFFERENTIAL/PLATELET
Abs Immature Granulocytes: 0.01 10*3/uL (ref 0.00–0.07)
Basophils Absolute: 0 10*3/uL (ref 0.0–0.1)
Basophils Relative: 0 %
Eosinophils Absolute: 0.1 10*3/uL (ref 0.0–0.5)
Eosinophils Relative: 1 %
HCT: 43.2 % (ref 39.0–52.0)
Hemoglobin: 14.4 g/dL (ref 13.0–17.0)
Immature Granulocytes: 0 %
Lymphocytes Relative: 22 %
Lymphs Abs: 1.1 10*3/uL (ref 0.7–4.0)
MCH: 27.9 pg (ref 26.0–34.0)
MCHC: 33.3 g/dL (ref 30.0–36.0)
MCV: 83.6 fL (ref 80.0–100.0)
Monocytes Absolute: 0.1 10*3/uL (ref 0.1–1.0)
Monocytes Relative: 1 %
Neutro Abs: 4 10*3/uL (ref 1.7–7.7)
Neutrophils Relative %: 76 %
Platelets: 240 10*3/uL (ref 150–400)
RBC: 5.17 MIL/uL (ref 4.22–5.81)
RDW: 13.8 % (ref 11.5–15.5)
WBC: 5.3 10*3/uL (ref 4.0–10.5)
nRBC: 0 % (ref 0.0–0.2)

## 2019-07-25 LAB — MAGNESIUM: Magnesium: 1.9 mg/dL (ref 1.7–2.4)

## 2019-07-25 LAB — TSH: TSH: 8.242 u[IU]/mL — ABNORMAL HIGH (ref 0.320–4.118)

## 2019-07-25 MED ORDER — ANTICOAGULANT SODIUM CITRATE 4% (200MG/5ML) IV SOLN: 5.0000 mL | Freq: Once | Status: DC

## 2019-07-25 MED ORDER — LORAZEPAM 1 MG PO TABS: 1.0000 mg | ORAL_TABLET | Freq: Three times a day (TID) | ORAL | 3 refills | Status: DC | PRN

## 2019-07-25 MED ORDER — SODIUM CHLORIDE 0.9% FLUSH
10.0000 mL | Freq: Once | INTRAVENOUS | Status: AC
  Administered 2019-07-25: 10 mL
  Filled 2019-07-25: qty 10

## 2019-07-25 MED ORDER — ONDANSETRON HCL 8 MG PO TABS: 8.0000 mg | ORAL_TABLET | Freq: Three times a day (TID) | ORAL | 1 refills | Status: DC | PRN

## 2019-07-25 NOTE — Assessment & Plan Note (Signed)
His TSH has improved I reinforced the importance of taking regular doses of thyroid medicine

## 2019-07-25 NOTE — Telephone Encounter (Signed)
Telephone call to patient and advised lab results as directed below. Patient verbalized an understanding and confirms dosage of synthroid. Patient confirms appt on Thursday also.

## 2019-07-25 NOTE — Telephone Encounter (Signed)
-----   Message from [name], MD sent at 07/25/2019  2:21 PM EDT ----- Regarding: pls let him know TSH is better. Continue current dose of thyroid medicine

## 2019-07-25 NOTE — Patient Instructions (Signed)

## 2019-07-25 NOTE — Progress Notes (Signed)
[facility] OFFICE PROGRESS NOTE  Patient Care Team: Patient, No Pcp Per as PCP - General (General Practice) [name], MD as Consulting Physician (Hematology and Oncology) Eppie Gibson, MD as Attending Physician (Radiation Oncology) Jodi Marble, MD as Consulting Physician (Otolaryngology) Philomena Doheny, MD as Referring Physician (Plastic Surgery) Irene Shipper, MD as Consulting Physician (Gastroenterology) System, Provider Not In  ASSESSMENT & PLAN:  Primary cancer of head and neck [facility]) He has amazing response to chemotherapy His neck pain is almost completely resolved even though the nodule seen is not completely regressed He tolerated treatment better this time with no profound nausea vomiting Per patient request, I will cancel his IV fluid support today and tomorrow He will return on Thursday for second dose of gemcitabine He will get a week of next week He will resume treatment again in November  Cancer associated pain He has less pain We will initiate slow taper of oxycodone  Acquired hypothyroidism His TSH has improved I reinforced the importance of taking regular doses of thyroid medicine   No orders of the defined types were placed in this encounter.   INTERVAL HISTORY: Please see below for problem oriented charting. 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 Pan-endoscopy elsewhere showed cancer from nasopharyngeal space.   01/04/2007 - 02/20/2007 Chemotherapy   He received 2 cycles of cisplatin and 5FU followed by concurrent chemo with weekly cisplatin and radiation. He only received 2 doses of chemo due to severe mucositis, nausea and weight loss.   04/05/2007 - 08/04/2007 Chemotherapy   He received 4 more courses of cisplatin with 5FU and had complete response   07/05/2009 Procedure   Fine-needle aspirate of the right level II lymph nodes come from recurrent metastatic disease. Repeat endoscopy and CT scan show no evidence of disease elsewhere.   07/08/2009 - 12/02/2009 Chemotherapy   He was given 6 cycles of carboplatin, 5-FU and docetaxel   12/03/2009 Surgery   He has surgery to the residual lymph node on the right neck which showed no evidence of disease.   02/22/2012 Imaging   Repeat imaging study showed large recurrent mass. He was referred elsewhere for further treatment.   05/03/2012 Surgery   He underwent left upper lobectomy.   04/29/2013 Imaging   PEt scan showed lesion on right level II B and lower lung was abnormal   06/03/2013 - 02/02/2014 Chemotherapy   He had 6 cycles of chemotherapy when he was found to have recurrence of cancer and had received oxaliplatin and capecitabine   06/07/2014 Imaging   PET CT scan showed persistent disease in the right neck lymph nodes and left lung   06/29/2014 Procedure   Accession: [id number] repeat LUL biopsy confirmed metastatic cancer   07/18/2014 - 07/31/2014 Radiation Therapy   He received  palliative radiation therapy to the lungs   10/10/2014 Imaging   CT scan of the chest, abdomen and pelvis show regression in the size of the lung nodule in the left upper lobe and stable pulmonary nodules   01/24/2015 Imaging   CT scan showed stable disease in neck and lung   06/19/2015 Imaging   CT scan of the neck and the chest show  possible mild progression of the nodule in the right side of the neck.   06/25/2015 Imaging   PET scan confirmed disease recurrence in the neck   07/07/2015 Imaging   He had [facility] neck [facility]   09/03/2015 - 08/26/2018 Chemotherapy   He received palliative chemo with Nivolumab   10/29/2015 Imaging   PET CT showed positive response to Rx   02/28/2016 Imaging   Ct abdomen showed abnormal thinkening in his stomach   03/03/2016 Imaging   CT: Right sternocleidomastoid muscle metastasis appears less distinct but otherwise not significantly changed in size or configuration since 06/19/2015.2. Left level 3 lymph node which was hypermetabolic by PET-CT in January 2017 appears slightly smaller   04/01/2016 Imaging   CT cervical spine showed no acute fracture or traumatic malalignment in the cervical spine   04/22/2016 Procedure   Port-a-cath placed.   06/16/2016 Imaging   Ct neck showed right sternocleidomastoid muscle metastasis is further decreased in conspicuity since May, and has mildly decreased in size since September 2016. Continued stability of sub-centimeter left cervical lymph nodes. No new or progressive metastatic disease in the neck.   06/16/2016 Imaging   CT chest showed stable masslike radiation fibrosis in the left upper lobe. Stable subcentimeter pulmonary nodules in the bilateral lower lobes. No new or progressive metastatic disease in the chest. Nonobstructing left renal stone.   10/13/2016 Imaging   Ct neck showed unchanged right sternocleidomastoid muscle metastasis. Unchanged subcentimeter left cervical lymph nodes. No evidence of new or progressive metastatic disease in the neck.   10/13/2016 Imaging   CT chest showed tiny hypervascular foci in the liver, not definitely seen on prior imaging of 06/16/2016 and 02/28/2016. Abdomen MRI without and with contrast recommended to further evaluate as metastatic disease is a concern. 2. Stable appearance of post treatment changes left upper  lung and scattered tiny bilateral pulmonary nodules.   02/11/2017 Imaging   Ct neck: Lymph node mass right posterior neck appears improved from the prior study. Small posterior lymph nodes on the left unchanged. Occluded right jugular vein unchanged.   02/11/2017 Imaging   1. Similar appearance of postsurgical and radiation changes in the left upper lobe. 2. Similar bilateral pulmonary nodules. 3. No thoracic adenopathy. 4. Subtle foci of post-contrast enhancement within the liver are suboptimally characterized on this nondedicated study. Likely similar. These could either be re-evaluated at followup or more entirely characterized with abdominal MRI. 5. Left nephrolithiasis.   05/19/2017 Imaging   Matted lymph node mass right posterior neck appears larger in the recent CT. Accurate measurements difficult due to infiltrating tumor margins and infiltration of the muscle. Right jugular vein again appears occluded or resected. Small left posterior lymph nodes stable. Left upper lobe airspace density stable and similar to the prior CT   06/03/2017 PET scan   1. Hypermetabolic ill-defined right level IIb lymph node, about 1.3 cm in diameter with maximum SUV 9.5 (formerly 8.1). Appearance suspicious for residual/recurrent malignancy. No worrisome left-sided lesion. 2. Left suprahilar indistinct opacity demonstrates no worrisome hypermetabolic activity. The 5 mm left lower lobe pulmonary nodule  is stable and not currently hypermetabolic although below sensitive PET-CT size thresholds. 3. Other imaging findings of potential clinical significance: Bilateral nonobstructive nephrolithiasis. Chronic bilateral maxillary sinusitis.   05/11/2018 PET scan   1. Continued chronic accentuated metabolic activity in the vicinity of right level IIB and the adjacent right sternocleidomastoid muscle, with ill definition of surrounding tissue planes. Maximum SUV is currently 8.1, formerly 9.5. Accentuated metabolic activity  is been present in this vicinity back through 06/25/2015, and there was also some low-level activity in this vicinity on 06/07/2014. Some of this may be from scarring and local muscular activity although clearly a component of residual tumor is difficult to exclude given the focally high activity. 2. Other imaging findings of potential clinical significance: Chronic bilateral maxillary sinusitis. Chronic scarring in the left upper lobe. Chronically stable 5 mm left lower lobe nodule is considered benign. Nonobstructive left nephrolithiasis.   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 COMMENT:No significant keratinization is identified. Other basaloid carcinomas are in the differential diagnosis. No cell block material is available for further testing.   09/12/2018 Procedure   He underwent fine Needle Aspiration   10/04/2018 PET scan   1. Significant progression of local recurrence laterally in the mid right neck with an enlarging, increasingly hypermetabolic soft tissue mass. This involves the right sternocleidomastoid muscle. 2. Small lymph nodes in the right axilla are increasingly hypermetabolic. These are nonspecific and potentially reactive, although could reflect a small metastases. Small hypermetabolic nodule in the left suprasternal notch is unchanged. 3. No other evidence of metastatic disease.    10/07/2018 - 12/23/2018 Chemotherapy   The patient had cisplatin plus gemzar   12/07/2018 Imaging   1. Decreased size of lateral right neck mass. 2. Unchanged soft tissue nodule in the suprasternal notch. 3. No evidence of new metastatic disease in the neck.    05/30/2019 Imaging   CT neck No clear change or progression compared to the study of March. Overall measurements of the right lateral neck mass are similar, approximately 3 x 1.8 cm. See  above discussion. One could argue that there is slight increase in lateral bulging, possibly with an increase in contrast enhancement, towards the inferior margin. This is of questionable validity but could possibly represent some progression or inflammatory change. Other findings in the region are stable.   07/20/2019 -  Chemotherapy   The patient had palonosetron (ALOXI) injection 0.25 mg, 0.25 mg, Intravenous,  Once, 1 of 4 cycles Administration: 0.25 mg (07/20/2019) CISplatin (PLATINOL) 84 mg in sodium chloride 0.9 % 250 mL chemo infusion, 40 mg/m2 = 84 mg (80 % of original dose 50 mg/m2), Intravenous,  Once, 1 of 4 cycles Dose modification: 40 mg/m2 (80 % of original dose 50 mg/m2, Cycle 1, Reason: Dose Not Tolerated) Administration: 84 mg (07/20/2019) gemcitabine (GEMZAR) 1,600 mg in sodium chloride 0.9 % 250 mL chemo infusion, 1,672 mg (80 % of original dose 1,000 mg/m2), Intravenous,  Once, 1 of 4 cycles Dose modification: 800 mg/m2 (80 % of original dose 1,000 mg/m2, Cycle 1, Reason: Provider Judgment) Administration: 1,600 mg (07/20/2019) ondansetron (ZOFRAN) 8 mg, dexamethasone (DECADRON) 10 mg in sodium chloride 0.9 % 50 mL IVPB, , Intravenous,  Once, 1 of 4 cycles fosaprepitant (EMEND) 150 mg, dexamethasone (DECADRON) 12 mg in sodium chloride 0.9 % 145 mL IVPB, , Intravenous,  Once, 1 of 4 cycles Administration:  (07/20/2019)  for  chemotherapy treatment.      REVIEW OF SYSTEMS:   Constitutional: Denies fevers, chills or abnormal weight loss Eyes: Denies blurriness of vision Ears, nose, mouth, throat, and face: Denies mucositis or sore throat Respiratory: Denies cough, dyspnea or wheezes Cardiovascular: Denies palpitation, chest discomfort or lower extremity swelling Gastrointestinal:  Denies nausea, heartburn or change in bowel habits Skin: Denies abnormal skin rashes Lymphatics: Denies new lymphadenopathy or easy bruising Neurological:Denies numbness, tingling or new  weaknesses Behavioral/Psych: Mood is stable, no new changes  All other systems were reviewed with the patient and are negative.  I have reviewed the past medical history, past surgical history, social history and family history with the patient and they are unchanged from previous note.  ALLERGIES:  is allergic to phenergan [promethazine hcl]; heparin; and clindamycin.  MEDICATIONS:  Current Outpatient Medications  Medication Sig Dispense Refill   cetirizine (ZYRTEC) 10 MG tablet Take 10 mg by mouth daily.     levothyroxine (SYNTHROID, LEVOTHROID) 137 MCG tablet TAKE 1 TABLET(137 MCG) BY MOUTH DAILY BEFORE BREAKFAST (Patient taking differently: Take 137 mcg by mouth daily before breakfast. ) 90 tablet 0   lidocaine (XYLOCAINE) 2 % solution Use as directed 5 mLs in the mouth or throat every 3 (three) hours as needed for mouth pain. Swish, gargle and spit 100 mL 2   LORazepam (ATIVAN) 1 MG tablet Take 1 tablet (1 mg total) by mouth every 8 (eight) hours as needed for anxiety. 30 tablet 3   metFORMIN (GLUCOPHAGE) 500 MG tablet TAKE 1 TABLET(500 MG) BY MOUTH DAILY WITH BREAKFAST 90 tablet 1   mirtazapine (REMERON) 15 MG tablet Take 1 tablet (15 mg total) by mouth at bedtime. 30 tablet 1   omeprazole (PRILOSEC) 20 MG capsule Take 1 capsule (20 mg total) by mouth daily. 30 capsule 5   ondansetron (ZOFRAN) 8 MG tablet Take 1 tablet (8 mg total) by mouth every 8 (eight) hours as needed for nausea or vomiting. 90 tablet 1   oxyCODONE 10 MG TABS Take 1 tablet (10 mg total) by mouth every 8 (eight) hours as needed. 60 tablet 0   PARoxetine (PAXIL) 20 MG tablet Take 1 tablet (20 mg total) by mouth daily. 30 tablet 5   polyethylene glycol (MIRALAX) packet Take 17 g by mouth daily. (Patient taking differently: Take 17 g by mouth daily as needed for mild constipation or moderate constipation. ) 14 each 3   prochlorperazine (COMPAZINE) 10 MG tablet Take 1 tablet (10 mg total) by mouth every 12  (twelve) hours as needed (Nausea or vomiting). 30 tablet 1   senna-docusate (SENOKOT-S) 8.6-50 MG tablet Take 2 tablets by mouth 3 (three) times daily. 90 tablet 0   No current facility-administered medications for this visit.     PHYSICAL EXAMINATION: ECOG PERFORMANCE STATUS: 1 - Symptomatic but completely ambulatory  Vitals:   07/25/19 1305  BP: 117/77  Pulse: 86  Resp: 18  Temp: 98 F (36.7 C)  SpO2: 100%   Filed Weights   07/25/19 1305  Weight: 191 lb (86.6 kg)    GENERAL:alert, no distress and comfortable SKIN: skin color, texture, turgor are normal, no rashes or significant lesions EYES: normal, Conjunctiva are pink and non-injected, sclera clear OROPHARYNX:no exudate, no erythema and lips, buccal mucosa, and tongue normal  NECK: The nodule on his neck is stable LYMPH:  no palpable lymphadenopathy in the cervical, axillary or inguinal LUNGS: clear to auscultation and percussion with normal breathing effort HEART: regular rate & rhythm and  no murmurs and no lower extremity edema ABDOMEN:abdomen soft, non-tender and normal bowel sounds Musculoskeletal:no cyanosis of digits and no clubbing  NEURO: alert & oriented x 3 with fluent speech, no focal motor/sensory deficits  LABORATORY DATA:  I have reviewed the data as listed    Component Value Date/Time   NA 138 07/25/2019 1020   NA 139 09/22/2017 0829   K 3.6 07/25/2019 1020   K 3.5 09/22/2017 0829   CL 102 07/25/2019 1020   CO2 24 07/25/2019 1020   CO2 26 09/22/2017 0829   GLUCOSE 142 (H) 07/25/2019 1020   GLUCOSE 133 09/22/2017 0829   BUN 17 07/25/2019 1020   BUN 14.1 09/22/2017 0829   CREATININE 0.84 07/25/2019 1020   CREATININE 0.93 02/06/2019 1215   CREATININE 0.9 09/22/2017 0829   CALCIUM 9.0 07/25/2019 1020   CALCIUM 9.1 09/22/2017 0829   PROT 7.0 07/25/2019 1020   PROT 6.8 09/22/2017 0829   ALBUMIN 3.9 07/25/2019 1020   ALBUMIN 4.1 09/22/2017 0829   AST 20 07/25/2019 1020   AST 31 02/06/2019 1215     AST 22 09/22/2017 0829   ALT 27 07/25/2019 1020   ALT 48 (H) 02/06/2019 1215   ALT 30 09/22/2017 0829   ALKPHOS 76 07/25/2019 1020   ALKPHOS 55 09/22/2017 0829   BILITOT 0.5 07/25/2019 1020   BILITOT 0.2 (L) 02/06/2019 1215   BILITOT 0.35 09/22/2017 0829   GFRNONAA >60 07/25/2019 1020   GFRNONAA >60 02/06/2019 1215   GFRAA >60 07/25/2019 1020   GFRAA >60 02/06/2019 1215    No results found for: SPEP, UPEP  Lab Results  Component Value Date   WBC 5.3 07/25/2019   NEUTROABS 4.0 07/25/2019   HGB 14.4 07/25/2019   HCT 43.2 07/25/2019   MCV 83.6 07/25/2019   PLT 240 07/25/2019      Chemistry      Component Value Date/Time   NA 138 07/25/2019 1020   NA 139 09/22/2017 0829   K 3.6 07/25/2019 1020   K 3.5 09/22/2017 0829   CL 102 07/25/2019 1020   CO2 24 07/25/2019 1020   CO2 26 09/22/2017 0829   BUN 17 07/25/2019 1020   BUN 14.1 09/22/2017 0829   CREATININE 0.84 07/25/2019 1020   CREATININE 0.93 02/06/2019 1215   CREATININE 0.9 09/22/2017 0829      Component Value Date/Time   CALCIUM 9.0 07/25/2019 1020   CALCIUM 9.1 09/22/2017 0829   ALKPHOS 76 07/25/2019 1020   ALKPHOS 55 09/22/2017 0829   AST 20 07/25/2019 1020   AST 31 02/06/2019 1215   AST 22 09/22/2017 0829   ALT 27 07/25/2019 1020   ALT 48 (H) 02/06/2019 1215   ALT 30 09/22/2017 0829   BILITOT 0.5 07/25/2019 1020   BILITOT 0.2 (L) 02/06/2019 1215   BILITOT 0.35 09/22/2017 0829        All questions were answered. The patient knows to call the clinic with any problems, questions or concerns. No barriers to learning was detected.  I spent 15 minutes counseling the patient face to face. The total time spent in the appointment was 20 minutes and more than 50% was on counseling and review of test results  [name], MD 07/25/2019 2:24 PM

## 2019-07-25 NOTE — Assessment & Plan Note (Signed)
He has less pain We will initiate slow taper of oxycodone

## 2019-07-25 NOTE — Progress Notes (Signed)
Pt declined sodium citrate anticoagulant today.  Hardie Pulley, PharmD, BCPS, BCOP

## 2019-07-25 NOTE — Assessment & Plan Note (Signed)
He has amazing response to chemotherapy His neck pain is almost completely resolved even though the nodule seen is not completely regressed He tolerated treatment better this time with no profound nausea vomiting Per patient request, I will cancel his IV fluid support today and tomorrow He will return on Thursday for second dose of gemcitabine He will get a week of next week He will resume treatment again in November

## 2019-07-26 ENCOUNTER — Telehealth: Payer: Self-pay | Admitting: Hematology and Oncology

## 2019-07-26 ENCOUNTER — Ambulatory Visit: Payer: Medicaid Other

## 2019-07-26 NOTE — Telephone Encounter (Signed)
Called pt per 10/20 sch message  - unable to reach patient. Left message with appt date and time - also put note in appts notes for 10/22 appt for RN to print schedule for patient.

## 2019-07-27 ENCOUNTER — Inpatient Hospital Stay: Payer: Medicaid Other

## 2019-07-27 ENCOUNTER — Other Ambulatory Visit: Payer: Self-pay

## 2019-07-27 ENCOUNTER — Ambulatory Visit: Payer: Medicaid Other

## 2019-07-27 VITALS — BP 132/68 | HR 76 | Temp 98.3°F | Resp 20

## 2019-07-27 DIAGNOSIS — C760 Malignant neoplasm of head, face and neck: Secondary | ICD-10-CM

## 2019-07-27 DIAGNOSIS — Z5111 Encounter for antineoplastic chemotherapy: Secondary | ICD-10-CM | POA: Diagnosis not present

## 2019-07-27 DIAGNOSIS — C7800 Secondary malignant neoplasm of unspecified lung: Secondary | ICD-10-CM

## 2019-07-27 DIAGNOSIS — Z95828 Presence of other vascular implants and grafts: Secondary | ICD-10-CM

## 2019-07-27 DIAGNOSIS — Z7189 Other specified counseling: Secondary | ICD-10-CM

## 2019-07-27 MED ORDER — DEXAMETHASONE SODIUM PHOSPHATE 10 MG/ML IJ SOLN
10.0000 mg | Freq: Once | INTRAMUSCULAR | Status: AC
  Administered 2019-07-27: 10 mg via INTRAVENOUS

## 2019-07-27 MED ORDER — ONDANSETRON HCL 4 MG/2ML IJ SOLN
INTRAMUSCULAR | Status: AC
  Filled 2019-07-27: qty 4

## 2019-07-27 MED ORDER — SODIUM CHLORIDE 0.9% FLUSH
10.0000 mL | INTRAVENOUS | Status: DC | PRN
  Administered 2019-07-27: 10 mL
  Filled 2019-07-27: qty 10

## 2019-07-27 MED ORDER — ONDANSETRON HCL 4 MG/2ML IJ SOLN
8.0000 mg | Freq: Once | INTRAMUSCULAR | Status: AC
  Administered 2019-07-27: 09:00:00 8 mg via INTRAVENOUS

## 2019-07-27 MED ORDER — DEXAMETHASONE SODIUM PHOSPHATE 10 MG/ML IJ SOLN
INTRAMUSCULAR | Status: AC
  Filled 2019-07-27: qty 1

## 2019-07-27 MED ORDER — SODIUM CHLORIDE 0.9 % IV SOLN
1600.0000 mg | Freq: Once | INTRAVENOUS | Status: AC
  Administered 2019-07-27: 1600 mg via INTRAVENOUS
  Filled 2019-07-27: qty 42.08

## 2019-07-27 MED ORDER — ANTICOAGULANT SODIUM CITRATE 4% (200MG/5ML) IV SOLN
5.0000 mL | Freq: Once | Status: AC
  Administered 2019-07-27: 5 mL
  Filled 2019-07-27: qty 5

## 2019-07-27 MED ORDER — SODIUM CHLORIDE 0.9 % IV SOLN
Freq: Once | INTRAVENOUS | Status: AC
  Administered 2019-07-27: 09:00:00 via INTRAVENOUS
  Filled 2019-07-27: qty 250

## 2019-07-27 MED ORDER — SODIUM CHLORIDE 0.9 % IV SOLN: Freq: Once | INTRAVENOUS | Status: DC

## 2019-07-27 NOTE — Patient Instructions (Signed)
[facility] °Discharge Instructions for Patients Receiving Chemotherapy ° °Today you received the following chemotherapy agents Gemzar ° °To help prevent nausea and vomiting after your treatment, we encourage you to take your nausea medication as directed. °  °If you develop nausea and vomiting that is not controlled by your nausea medication, call the clinic.  ° °BELOW ARE SYMPTOMS THAT SHOULD BE REPORTED IMMEDIATELY: °· *FEVER GREATER THAN 100.5 F °· *CHILLS WITH OR WITHOUT FEVER °· NAUSEA AND VOMITING THAT IS NOT CONTROLLED WITH YOUR NAUSEA MEDICATION °· *UNUSUAL SHORTNESS OF BREATH °· *UNUSUAL BRUISING OR BLEEDING °· TENDERNESS IN MOUTH AND THROAT WITH OR WITHOUT PRESENCE OF ULCERS °· *URINARY PROBLEMS °· *BOWEL PROBLEMS °· UNUSUAL RASH °Items with * indicate a potential emergency and should be followed up as soon as possible. ° °Feel free to call the clinic should you have any questions or concerns. The clinic phone number is [phone]. ° °Please show the CHEMO ALERT CARD at check-in to the Emergency Department and triage nurse. ° ° °

## 2019-08-08 ENCOUNTER — Other Ambulatory Visit: Payer: Self-pay

## 2019-08-08 ENCOUNTER — Inpatient Hospital Stay: Payer: Medicaid Other

## 2019-08-08 ENCOUNTER — Telehealth: Payer: Self-pay | Admitting: Hematology and Oncology

## 2019-08-08 ENCOUNTER — Encounter: Payer: Self-pay | Admitting: Hematology and Oncology

## 2019-08-08 ENCOUNTER — Inpatient Hospital Stay: Payer: Medicaid Other | Attending: Hematology and Oncology | Admitting: Hematology and Oncology

## 2019-08-08 DIAGNOSIS — C760 Malignant neoplasm of head, face and neck: Secondary | ICD-10-CM

## 2019-08-08 DIAGNOSIS — E039 Hypothyroidism, unspecified: Secondary | ICD-10-CM | POA: Diagnosis not present

## 2019-08-08 DIAGNOSIS — C119 Malignant neoplasm of nasopharynx, unspecified: Secondary | ICD-10-CM

## 2019-08-08 DIAGNOSIS — R110 Nausea: Secondary | ICD-10-CM | POA: Diagnosis not present

## 2019-08-08 DIAGNOSIS — G893 Neoplasm related pain (acute) (chronic): Secondary | ICD-10-CM

## 2019-08-08 DIAGNOSIS — C7800 Secondary malignant neoplasm of unspecified lung: Secondary | ICD-10-CM

## 2019-08-08 DIAGNOSIS — R112 Nausea with vomiting, unspecified: Secondary | ICD-10-CM | POA: Diagnosis not present

## 2019-08-08 DIAGNOSIS — Z79899 Other long term (current) drug therapy: Secondary | ICD-10-CM | POA: Diagnosis not present

## 2019-08-08 DIAGNOSIS — Z5111 Encounter for antineoplastic chemotherapy: Secondary | ICD-10-CM | POA: Diagnosis not present

## 2019-08-08 LAB — CBC WITH DIFFERENTIAL/PLATELET
Abs Immature Granulocytes: 0.03 10*3/uL (ref 0.00–0.07)
Basophils Absolute: 0 10*3/uL (ref 0.0–0.1)
Basophils Relative: 0 %
Eosinophils Absolute: 0.1 10*3/uL (ref 0.0–0.5)
Eosinophils Relative: 1 %
HCT: 41.5 % (ref 39.0–52.0)
Hemoglobin: 13.4 g/dL (ref 13.0–17.0)
Immature Granulocytes: 1 %
Lymphocytes Relative: 34 %
Lymphs Abs: 1.3 10*3/uL (ref 0.7–4.0)
MCH: 28.6 pg (ref 26.0–34.0)
MCHC: 32.3 g/dL (ref 30.0–36.0)
MCV: 88.7 fL (ref 80.0–100.0)
Monocytes Absolute: 0.5 10*3/uL (ref 0.1–1.0)
Monocytes Relative: 15 %
Neutro Abs: 1.8 10*3/uL (ref 1.7–7.7)
Neutrophils Relative %: 49 %
Platelets: 399 10*3/uL (ref 150–400)
RBC: 4.68 MIL/uL (ref 4.22–5.81)
RDW: 15.6 % — ABNORMAL HIGH (ref 11.5–15.5)
WBC: 3.7 10*3/uL — ABNORMAL LOW (ref 4.0–10.5)
nRBC: 0 % (ref 0.0–0.2)

## 2019-08-08 LAB — COMPREHENSIVE METABOLIC PANEL
ALT: 32 U/L (ref 0–44)
AST: 25 U/L (ref 15–41)
Albumin: 4.3 g/dL (ref 3.5–5.0)
Alkaline Phosphatase: 72 U/L (ref 38–126)
Anion gap: 13 (ref 5–15)
BUN: 10 mg/dL (ref 6–20)
CO2: 27 mmol/L (ref 22–32)
Calcium: 9.4 mg/dL (ref 8.9–10.3)
Chloride: 101 mmol/L (ref 98–111)
Creatinine, Ser: 1.07 mg/dL (ref 0.61–1.24)
GFR calc Af Amer: >60 mL/min (ref >60)
GFR calc non Af Amer: >60 mL/min (ref >60)
Glucose, Bld: 136 mg/dL — ABNORMAL HIGH (ref 70–99)
Potassium: 3.9 mmol/L (ref 3.5–5.1)
Sodium: 141 mmol/L (ref 135–145)
Total Bilirubin: <0.2 mg/dL — ABNORMAL LOW (ref 0.3–1.2)
Total Protein: 7.2 g/dL (ref 6.5–8.1)

## 2019-08-08 LAB — TSH: TSH: 6.935 u[IU]/mL — ABNORMAL HIGH (ref 0.320–4.118)

## 2019-08-08 LAB — MAGNESIUM: Magnesium: 1.8 mg/dL (ref 1.7–2.4)

## 2019-08-08 NOTE — Assessment & Plan Note (Signed)
He has history of severe, chemotherapy associated nausea and vomiting He is scheduled for daily IV fluids along with IV antiemetics for 2 days after each dose of cisplatin He will also continue antiemetics as needed

## 2019-08-08 NOTE — Assessment & Plan Note (Signed)
He tolerated recent chemotherapy well without major side effects His examination is unchanged Per previous discussion, we plan to complete 3 cycles of chemotherapy before repeat imaging study I will see him again next week for further follow-up

## 2019-08-08 NOTE — Assessment & Plan Note (Signed)
He has less pain We will initiate slow taper of oxycodone

## 2019-08-08 NOTE — Progress Notes (Signed)
[facility] OFFICE PROGRESS NOTE  Patient Care Team: Patient, No Pcp Per as PCP - General (General Practice) [name], MD as Consulting Physician (Hematology and Oncology) Eppie Gibson, MD as Attending Physician (Radiation Oncology) Jodi Marble, MD as Consulting Physician (Otolaryngology) Philomena Doheny, MD as Referring Physician (Plastic Surgery) Irene Shipper, MD as Consulting Physician (Gastroenterology) System, Provider Not In  ASSESSMENT & PLAN:  Primary cancer of head and neck [facility]) Christian Simmons tolerated recent chemotherapy well without major side effects His examination is unchanged Per previous discussion, we plan to complete 3 cycles of chemotherapy before repeat imaging study I will see him again next week for further follow-up  Acquired hypothyroidism His TSH is improving since the patient started to become compliant with treatment We will continue the same dose of levothyroxine  Cancer associated pain Christian Simmons has less pain We will initiate slow taper of oxycodone  Nausea and vomiting Christian Simmons has history of severe, chemotherapy associated nausea and vomiting Christian Simmons is scheduled for daily IV fluids along with IV antiemetics for 2 days after each dose of cisplatin Christian Simmons will also continue antiemetics as needed   No orders of the defined types were placed in this encounter.   INTERVAL HISTORY: Please see below for problem oriented charting. 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 Pan-endoscopy elsewhere showed cancer from nasopharyngeal space.   01/04/2007 - 02/20/2007 Chemotherapy   Christian Simmons received 2 cycles of cisplatin and 5FU followed by concurrent chemo with weekly cisplatin and radiation. Christian Simmons only received 2 doses of chemo due to severe mucositis, nausea and weight loss.   04/05/2007 - 08/04/2007 Chemotherapy   Christian Simmons received 4 more courses of cisplatin with 5FU and had complete response   07/05/2009 Procedure   Fine-needle aspirate of the right level II lymph nodes come from recurrent metastatic disease. Repeat endoscopy and CT scan show no evidence of disease elsewhere.   07/08/2009 - 12/02/2009 Chemotherapy   Christian Simmons was given 6 cycles of carboplatin, 5-FU and docetaxel   12/03/2009 Surgery   Christian Simmons has surgery to the residual lymph node on the right neck which showed no evidence of disease.   02/22/2012 Imaging   Repeat imaging study showed large recurrent mass. Christian Simmons was referred elsewhere for further treatment.   05/03/2012 Surgery   Christian Simmons underwent left upper lobectomy.   04/29/2013 Imaging   PEt scan showed lesion on right level II B and lower lung was abnormal   06/03/2013 - 02/02/2014 Chemotherapy   Christian Simmons had 6 cycles of chemotherapy when Christian Simmons was found to have recurrence of cancer and had received oxaliplatin and capecitabine   06/07/2014 Imaging   PET CT scan showed persistent disease in the right neck lymph nodes and left  lung   06/29/2014 Procedure   Accession: [id number] repeat LUL biopsy confirmed metastatic cancer   07/18/2014 - 07/31/2014 Radiation Therapy   Christian Simmons received palliative radiation therapy to the lungs   10/10/2014 Imaging   CT scan of the chest, abdomen and pelvis show regression in the size of the lung nodule in the  left upper lobe and stable pulmonary nodules   01/24/2015 Imaging   CT scan showed stable disease in neck and lung   06/19/2015 Imaging   CT scan of the neck and the chest show possible mild progression of the nodule in the right side of the neck.   06/25/2015 Imaging   PET scan confirmed disease recurrence in the neck   07/07/2015 Imaging   Christian Simmons had MRI neck at [facility]   09/03/2015 - 08/26/2018 Chemotherapy   Christian Simmons received palliative chemo with Nivolumab   10/29/2015 Imaging   PET CT showed positive response to Rx   02/28/2016 Imaging   Ct abdomen showed abnormal thinkening in his stomach   03/03/2016 Imaging   CT: Right sternocleidomastoid muscle metastasis appears less distinct but otherwise not significantly changed in size or configuration since 06/19/2015.2. Left level 3 lymph node which was hypermetabolic by PET-CT in January 2017 appears slightly smaller   04/01/2016 Imaging   CT cervical spine showed no acute fracture or traumatic malalignment in the cervical spine   04/22/2016 Procedure   Port-a-cath placed.   06/16/2016 Imaging   Ct neck showed right sternocleidomastoid muscle metastasis is further decreased in conspicuity since May, and has mildly decreased in size since September 2016. Continued stability of sub-centimeter left cervical lymph nodes. No new or progressive metastatic disease in the neck.   06/16/2016 Imaging   CT chest showed stable masslike radiation fibrosis in the left upper lobe. Stable subcentimeter pulmonary nodules in the bilateral lower lobes. No new or progressive metastatic disease in the chest. Nonobstructing left renal stone.   10/13/2016 Imaging   Ct neck showed unchanged right sternocleidomastoid muscle metastasis. Unchanged subcentimeter left cervical lymph nodes. No evidence of new or progressive metastatic disease in the neck.   10/13/2016 Imaging   CT chest showed tiny hypervascular foci in the liver, not definitely seen on prior imaging of 06/16/2016  and 02/28/2016. Abdomen MRI without and with contrast recommended to further evaluate as metastatic disease is a concern. 2. Stable appearance of post treatment changes left upper lung and scattered tiny bilateral pulmonary nodules.   02/11/2017 Imaging   Ct neck: Lymph node mass right posterior neck appears improved from the prior study. Small posterior lymph nodes on the left unchanged. Occluded right jugular vein unchanged.   02/11/2017 Imaging   1. Similar appearance of postsurgical and radiation changes in the left upper lobe. 2. Similar bilateral pulmonary nodules. 3. No thoracic adenopathy. 4. Subtle foci of post-contrast enhancement within the liver are suboptimally characterized on this nondedicated study. Likely similar. These could either be re-evaluated at followup or more entirely characterized with abdominal MRI. 5. Left nephrolithiasis.   05/19/2017 Imaging   Matted lymph node mass right posterior neck appears larger in the recent CT. Accurate measurements difficult due to infiltrating tumor margins and infiltration of the muscle. Right jugular vein again appears occluded or resected. Small left posterior lymph nodes stable. Left upper lobe airspace density stable and similar to the prior CT   06/03/2017 PET scan   1. Hypermetabolic ill-defined right level IIb lymph node, about 1.3 cm in diameter with maximum SUV 9.5 (formerly 8.1). Appearance  suspicious for residual/recurrent malignancy. No worrisome left-sided lesion. 2. Left suprahilar indistinct opacity demonstrates no worrisome hypermetabolic activity. The 5 mm left lower lobe pulmonary nodule is stable and not currently hypermetabolic although below sensitive PET-CT size thresholds. 3. Other imaging findings of potential clinical significance: Bilateral nonobstructive nephrolithiasis. Chronic bilateral maxillary sinusitis.   05/11/2018 PET scan   1. Continued chronic accentuated metabolic activity in the vicinity of right level  IIB and the adjacent right sternocleidomastoid muscle, with ill definition of surrounding tissue planes. Maximum SUV is currently 8.1, formerly 9.5. Accentuated metabolic activity is been present in this vicinity back through 06/25/2015, and there was also some low-level activity in this vicinity on 06/07/2014. Some of this may be from scarring and local muscular activity although clearly a component of residual tumor is difficult to exclude given the focally high activity. 2. Other imaging findings of potential clinical significance: Chronic bilateral maxillary sinusitis. Chronic scarring in the left upper lobe. Chronically stable 5 mm left lower lobe nodule is considered benign. Nonobstructive left nephrolithiasis.   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 COMMENT:No significant keratinization is identified. Other basaloid carcinomas are in the differential diagnosis. No cell block material is available for further testing.   09/12/2018 Procedure   Christian Simmons underwent fine Needle Aspiration   10/04/2018 PET scan   1. Significant progression of local recurrence laterally in the mid right neck with an enlarging, increasingly hypermetabolic soft tissue mass. This involves the right sternocleidomastoid muscle. 2. Small lymph nodes in the right axilla are increasingly hypermetabolic. These are nonspecific and potentially reactive, although could reflect a small metastases. Small hypermetabolic nodule in the left suprasternal notch is unchanged. 3. No other evidence of metastatic disease.    10/07/2018 - 12/23/2018 Chemotherapy   The patient had cisplatin plus gemzar   12/07/2018 Imaging   1. Decreased size of lateral right neck mass. 2. Unchanged soft tissue nodule in the suprasternal notch. 3. No evidence of new metastatic disease in the neck.     05/30/2019 Imaging   CT neck No clear change or progression compared to the study of March. Overall measurements of the right lateral neck mass are similar, approximately 3 x 1.8 cm. See above discussion. One could argue that there is slight increase in lateral bulging, possibly with an increase in contrast enhancement, towards the inferior margin. This is of questionable validity but could possibly represent some progression or inflammatory change. Other findings in the region are stable.   07/20/2019 -  Chemotherapy   The patient had palonosetron (ALOXI) injection 0.25 mg, 0.25 mg, Intravenous,  Once, 1 of 4 cycles Administration: 0.25 mg (07/20/2019) CISplatin (PLATINOL) 84 mg in sodium chloride 0.9 % 250 mL chemo infusion, 40 mg/m2 = 84 mg (80 % of original dose 50 mg/m2), Intravenous,  Once, 1 of 4 cycles Dose modification: 40 mg/m2 (80 % of original dose 50 mg/m2, Cycle 1, Reason: Dose Not Tolerated) Administration: 84 mg (07/20/2019) gemcitabine (GEMZAR) 1,600 mg in sodium chloride 0.9 % 250 mL chemo infusion, 1,672 mg (80 % of original dose 1,000 mg/m2), Intravenous,  Once, 1 of 4 cycles Dose modification: 800 mg/m2 (80 % of original dose 1,000 mg/m2, Cycle 1, Reason: Provider Judgment) Administration: 1,600 mg (07/20/2019), 1,600 mg (07/27/2019) ondansetron (ZOFRAN) 8 mg, dexamethasone (DECADRON) 10 mg in sodium chloride 0.9 % 50 mL IVPB, , Intravenous,  Once, 1 of 4 cycles  fosaprepitant (EMEND) 150 mg, dexamethasone (DECADRON) 12 mg in sodium chloride 0.9 % 145 mL IVPB, , Intravenous,  Once, 1 of 4 cycles Administration:  (07/20/2019)  for chemotherapy treatment.      REVIEW OF SYSTEMS:   Constitutional: Denies fevers, chills or abnormal weight loss Eyes: Denies blurriness of vision Ears, nose, mouth, throat, and face: Denies mucositis or sore throat Respiratory: Denies cough, dyspnea or wheezes Cardiovascular: Denies palpitation, chest discomfort or lower extremity  swelling Gastrointestinal:  Denies nausea, heartburn or change in bowel habits Skin: Denies abnormal skin rashes Lymphatics: Denies new lymphadenopathy or easy bruising Neurological:Denies numbness, tingling or new weaknesses Behavioral/Psych: Mood is stable, no new changes  All other systems were reviewed with the patient and are negative.  I have reviewed the past medical history, past surgical history, social history and family history with the patient and they are unchanged from previous note.  ALLERGIES:  is allergic to phenergan [promethazine hcl]; heparin; and clindamycin.  MEDICATIONS:  Current Outpatient Medications  Medication Sig Dispense Refill  . cetirizine (ZYRTEC) 10 MG tablet Take 10 mg by mouth daily.    Marland Kitchen levothyroxine (SYNTHROID, LEVOTHROID) 137 MCG tablet TAKE 1 TABLET(137 MCG) BY MOUTH DAILY BEFORE BREAKFAST (Patient taking differently: Take 137 mcg by mouth daily before breakfast. ) 90 tablet 0  . lidocaine (XYLOCAINE) 2 % solution Use as directed 5 mLs in the mouth or throat every 3 (three) hours as needed for mouth pain. Swish, gargle and spit 100 mL 2  . LORazepam (ATIVAN) 1 MG tablet Take 1 tablet (1 mg total) by mouth every 8 (eight) hours as needed for anxiety. 30 tablet 3  . metFORMIN (GLUCOPHAGE) 500 MG tablet TAKE 1 TABLET(500 MG) BY MOUTH DAILY WITH BREAKFAST 90 tablet 1  . mirtazapine (REMERON) 15 MG tablet Take 1 tablet (15 mg total) by mouth at bedtime. 30 tablet 1  . omeprazole (PRILOSEC) 20 MG capsule Take 1 capsule (20 mg total) by mouth daily. 30 capsule 5  . ondansetron (ZOFRAN) 8 MG tablet Take 1 tablet (8 mg total) by mouth every 8 (eight) hours as needed for nausea or vomiting. 90 tablet 1  . oxyCODONE 10 MG TABS Take 1 tablet (10 mg total) by mouth every 8 (eight) hours as needed. 60 tablet 0  . PARoxetine (PAXIL) 20 MG tablet Take 1 tablet (20 mg total) by mouth daily. 30 tablet 5  . polyethylene glycol (MIRALAX) packet Take 17 g by mouth daily.  (Patient taking differently: Take 17 g by mouth daily as needed for mild constipation or moderate constipation. ) 14 each 3  . prochlorperazine (COMPAZINE) 10 MG tablet Take 1 tablet (10 mg total) by mouth every 12 (twelve) hours as needed (Nausea or vomiting). 30 tablet 1  . senna-docusate (SENOKOT-S) 8.6-50 MG tablet Take 2 tablets by mouth 3 (three) times daily. 90 tablet 0   No current facility-administered medications for this visit.     PHYSICAL EXAMINATION: ECOG PERFORMANCE STATUS: 1 - Symptomatic but completely ambulatory  Vitals:   08/08/19 1155  BP: 119/79  Pulse: 81  Resp: 17  Temp: 98.9 F (37.2 C)  SpO2: 100%   Filed Weights   08/08/19 1155  Weight: 190 lb 3.2 oz (86.3 kg)    GENERAL:alert, no distress and comfortable SKIN: skin color, texture, turgor are normal, no rashes or significant lesions EYES: normal, Conjunctiva are pink and non-injected, sclera clear OROPHARYNX:no exudate, no erythema and lips, buccal mucosa, and tongue normal  NECK: Well-healed surgical scar.  Previously noted nodule on the right side of the neck is stable LYMPH:  no palpable lymphadenopathy in the cervical, axillary or inguinal LUNGS: clear to auscultation and percussion with normal breathing effort HEART: regular rate & rhythm and no murmurs and no lower extremity edema ABDOMEN:abdomen soft, non-tender and normal bowel sounds Musculoskeletal:no cyanosis of digits and no clubbing  NEURO: alert & oriented x 3 with fluent speech, no focal motor/sensory deficits  LABORATORY DATA:  I have reviewed the data as listed    Component Value Date/Time   NA 141 08/08/2019 1210   NA 139 09/22/2017 0829   K 3.9 08/08/2019 1210   K 3.5 09/22/2017 0829   CL 101 08/08/2019 1210   CO2 27 08/08/2019 1210   CO2 26 09/22/2017 0829   GLUCOSE 136 (H) 08/08/2019 1210   GLUCOSE 133 09/22/2017 0829   BUN 10 08/08/2019 1210   BUN 14.1 09/22/2017 0829   CREATININE 1.07 08/08/2019 1210   CREATININE  0.93 02/06/2019 1215   CREATININE 0.9 09/22/2017 0829   CALCIUM 9.4 08/08/2019 1210   CALCIUM 9.1 09/22/2017 0829   PROT 7.2 08/08/2019 1210   PROT 6.8 09/22/2017 0829   ALBUMIN 4.3 08/08/2019 1210   ALBUMIN 4.1 09/22/2017 0829   AST 25 08/08/2019 1210   AST 31 02/06/2019 1215   AST 22 09/22/2017 0829   ALT 32 08/08/2019 1210   ALT 48 (H) 02/06/2019 1215   ALT 30 09/22/2017 0829   ALKPHOS 72 08/08/2019 1210   ALKPHOS 55 09/22/2017 0829   BILITOT <0.2 (L) 08/08/2019 1210   BILITOT 0.2 (L) 02/06/2019 1215   BILITOT 0.35 09/22/2017 0829   GFRNONAA >60 08/08/2019 1210   GFRNONAA >60 02/06/2019 1215   GFRAA >60 08/08/2019 1210   GFRAA >60 02/06/2019 1215    No results found for: SPEP, UPEP  Lab Results  Component Value Date   WBC 3.7 (L) 08/08/2019   NEUTROABS 1.8 08/08/2019   HGB 13.4 08/08/2019   HCT 41.5 08/08/2019   MCV 88.7 08/08/2019   PLT 399 08/08/2019      Chemistry      Component Value Date/Time   NA 141 08/08/2019 1210   NA 139 09/22/2017 0829   K 3.9 08/08/2019 1210   K 3.5 09/22/2017 0829   CL 101 08/08/2019 1210   CO2 27 08/08/2019 1210   CO2 26 09/22/2017 0829   BUN 10 08/08/2019 1210   BUN 14.1 09/22/2017 0829   CREATININE 1.07 08/08/2019 1210   CREATININE 0.93 02/06/2019 1215   CREATININE 0.9 09/22/2017 0829      Component Value Date/Time   CALCIUM 9.4 08/08/2019 1210   CALCIUM 9.1 09/22/2017 0829   ALKPHOS 72 08/08/2019 1210   ALKPHOS 55 09/22/2017 0829   AST 25 08/08/2019 1210   AST 31 02/06/2019 1215   AST 22 09/22/2017 0829   ALT 32 08/08/2019 1210   ALT 48 (H) 02/06/2019 1215   ALT 30 09/22/2017 0829   BILITOT <0.2 (L) 08/08/2019 1210   BILITOT 0.2 (L) 02/06/2019 1215   BILITOT 0.35 09/22/2017 0829       All questions were answered. The patient knows to call the clinic with any problems, questions or concerns. No barriers to learning was detected.  I spent 15 minutes counseling the patient face to face. The total time spent in  the appointment was 20 minutes and more than 50% was on counseling and review of test results  [name], MD 08/08/2019 2:09 PM

## 2019-08-08 NOTE — Assessment & Plan Note (Signed)
His TSH is improving since the patient started to become compliant with treatment We will continue the same dose of levothyroxine

## 2019-08-08 NOTE — Telephone Encounter (Signed)
Scheduled appt per 11/3 sch message - pt to get an updated schedule next visit.

## 2019-08-10 ENCOUNTER — Inpatient Hospital Stay: Payer: Medicaid Other

## 2019-08-10 ENCOUNTER — Other Ambulatory Visit: Payer: Self-pay

## 2019-08-10 VITALS — BP 135/80 | HR 92 | Temp 98.7°F | Resp 18

## 2019-08-10 DIAGNOSIS — Z7189 Other specified counseling: Secondary | ICD-10-CM

## 2019-08-10 DIAGNOSIS — Z95828 Presence of other vascular implants and grafts: Secondary | ICD-10-CM

## 2019-08-10 DIAGNOSIS — C7800 Secondary malignant neoplasm of unspecified lung: Secondary | ICD-10-CM

## 2019-08-10 DIAGNOSIS — Z5111 Encounter for antineoplastic chemotherapy: Secondary | ICD-10-CM | POA: Diagnosis not present

## 2019-08-10 DIAGNOSIS — C760 Malignant neoplasm of head, face and neck: Secondary | ICD-10-CM

## 2019-08-10 MED ORDER — POTASSIUM CHLORIDE 2 MEQ/ML IV SOLN
Freq: Once | INTRAVENOUS | Status: AC
  Administered 2019-08-10: 10:00:00 via INTRAVENOUS
  Filled 2019-08-10: qty 10

## 2019-08-10 MED ORDER — PALONOSETRON HCL INJECTION 0.25 MG/5ML
0.2500 mg | Freq: Once | INTRAVENOUS | Status: AC
  Administered 2019-08-10: 0.25 mg via INTRAVENOUS

## 2019-08-10 MED ORDER — SODIUM CHLORIDE 0.9 % IV SOLN
Freq: Once | INTRAVENOUS | Status: AC
  Administered 2019-08-10: 12:00:00 via INTRAVENOUS
  Filled 2019-08-10: qty 5

## 2019-08-10 MED ORDER — SODIUM CHLORIDE 0.9 % IV SOLN
40.0000 mg/m2 | Freq: Once | INTRAVENOUS | Status: AC
  Administered 2019-08-10: 14:00:00 84 mg via INTRAVENOUS
  Filled 2019-08-10: qty 84

## 2019-08-10 MED ORDER — PALONOSETRON HCL INJECTION 0.25 MG/5ML
INTRAVENOUS | Status: AC
  Filled 2019-08-10: qty 5

## 2019-08-10 MED ORDER — SODIUM CHLORIDE 0.9 % IV SOLN
1600.0000 mg | Freq: Once | INTRAVENOUS | Status: AC
  Administered 2019-08-10: 1600 mg via INTRAVENOUS
  Filled 2019-08-10: qty 42.08

## 2019-08-10 MED ORDER — SODIUM CHLORIDE 0.9 % IV SOLN
Freq: Once | INTRAVENOUS | Status: AC
  Administered 2019-08-10: 10:00:00 via INTRAVENOUS
  Filled 2019-08-10: qty 250

## 2019-08-10 MED ORDER — SODIUM CHLORIDE 0.9% FLUSH
10.0000 mL | INTRAVENOUS | Status: DC | PRN
  Administered 2019-08-10: 10 mL
  Filled 2019-08-10: qty 10

## 2019-08-10 MED ORDER — ANTICOAGULANT SODIUM CITRATE 4% (200MG/5ML) IV SOLN
5.0000 mL | Freq: Once | Status: AC
  Administered 2019-08-10: 5 mL
  Filled 2019-08-10: qty 5

## 2019-08-10 NOTE — Patient Instructions (Signed)
[facility] Discharge Instructions for Patients Receiving Chemotherapy  Today you received the following chemotherapy agents Gemcitabine (GEMZAR) & Cisplatin (PLATINOL).  To help prevent nausea and vomiting after your treatment, we encourage you to take your nausea medication as prescribed.   If you develop nausea and vomiting that is not controlled by your nausea medication, call the clinic.   BELOW ARE SYMPTOMS THAT SHOULD BE REPORTED IMMEDIATELY:  *FEVER GREATER THAN 100.5 F  *CHILLS WITH OR WITHOUT FEVER  NAUSEA AND VOMITING THAT IS NOT CONTROLLED WITH YOUR NAUSEA MEDICATION  *UNUSUAL SHORTNESS OF BREATH  *UNUSUAL BRUISING OR BLEEDING  TENDERNESS IN MOUTH AND THROAT WITH OR WITHOUT PRESENCE OF ULCERS  *URINARY PROBLEMS  *BOWEL PROBLEMS  UNUSUAL RASH Items with * indicate a potential emergency and should be followed up as soon as possible.  Feel free to call the clinic should you have any questions or concerns. The clinic phone number is [phone].  Please show the [facility] at check-in to the Emergency Department and triage nurse.  Coronavirus (COVID-19) Are you at risk?  Are you at risk for the Coronavirus (COVID-19)?  To be considered HIGH RISK for Coronavirus (COVID-19), you have to meet the following criteria:  . Traveled to [place], [place], [place], [place] or [place]; or in the [place] to [place], [place], [place], or [place]; and have fever, cough, and shortness of breath within the last 2 weeks of travel OR . Been in close contact with a person diagnosed with COVID-19 within the last 2 weeks and have fever, cough, and shortness of breath . IF YOU DO NOT MEET THESE CRITERIA, YOU ARE CONSIDERED LOW RISK FOR COVID-19.  What to do if you are HIGH RISK for COVID-19?  Marland Kitchen If you are having a medical emergency, call 911. . Seek medical care right away. Before you go to a doctor's office, urgent care or emergency department, call  ahead and tell them about your recent travel, contact with someone diagnosed with COVID-19, and your symptoms. You should receive instructions from your physician's office regarding next steps of care.  . When you arrive at healthcare provider, tell the healthcare staff immediately you have returned from visiting [place], [place], [place], [place] or [place]; or traveled in the [place] to [place], [place], [place], or [place]; in the last two weeks or you have been in close contact with a person diagnosed with COVID-19 in the last 2 weeks.   . Tell the health care staff about your symptoms: fever, cough and shortness of breath. . After you have been seen by a medical provider, you will be either: o Tested for (COVID-19) and discharged home on quarantine except to seek medical care if symptoms worsen, and asked to  - Stay home and avoid contact with others until you get your results (4-5 days)  - Avoid travel on public transportation if possible (such as bus, train, or airplane) or o Sent to the Emergency Department by EMS for evaluation, COVID-19 testing, and possible admission depending on your condition and test results.  What to do if you are LOW RISK for COVID-19?  Reduce your risk of any infection by using the same precautions used for avoiding the common cold or flu:  Marland Kitchen Wash your hands often with soap and warm water for at least 20 seconds.  If soap and water are not readily available, use an alcohol-based hand sanitizer with at least 60% alcohol.  Marland Kitchen  If coughing or sneezing, cover your mouth and nose by coughing or sneezing into the elbow areas of your shirt or coat, into a tissue or into your sleeve (not your hands). . Avoid shaking hands with others and consider head nods or verbal greetings only. . Avoid touching your eyes, nose, or mouth with unwashed hands.  . Avoid close contact with people who are sick. . Avoid places or events with large numbers of people in one location,  like concerts or sporting events. . Carefully consider travel plans you have or are making. . If you are planning any travel outside or inside the [place], visit the CDC's Travelers' Health webpage for the latest health notices. . If you have some symptoms but not all symptoms, continue to monitor at home and seek medical attention if your symptoms worsen. . If you are having a medical emergency, call 911.   [place] / e-Visit: [email or web address]         [facility]: [facility]: [phone]                   [facility]: [phone]

## 2019-08-11 ENCOUNTER — Inpatient Hospital Stay: Payer: Medicaid Other

## 2019-08-11 MED ORDER — ANTICOAGULANT SODIUM CITRATE 4% (200MG/5ML) IV SOLN
5.0000 mL | Freq: Once | Status: DC
  Filled 2019-08-11: qty 5

## 2019-08-12 ENCOUNTER — Telehealth: Payer: Self-pay

## 2019-08-12 ENCOUNTER — Inpatient Hospital Stay: Payer: Medicaid Other

## 2019-08-17 ENCOUNTER — Telehealth: Payer: Self-pay | Admitting: *Deleted

## 2019-08-17 ENCOUNTER — Inpatient Hospital Stay: Payer: Medicaid Other

## 2019-08-17 ENCOUNTER — Other Ambulatory Visit: Payer: Self-pay | Admitting: Hematology and Oncology

## 2019-08-17 ENCOUNTER — Inpatient Hospital Stay (HOSPITAL_BASED_OUTPATIENT_CLINIC_OR_DEPARTMENT_OTHER): Payer: Medicaid Other | Admitting: Hematology and Oncology

## 2019-08-17 ENCOUNTER — Other Ambulatory Visit: Payer: Self-pay

## 2019-08-17 ENCOUNTER — Encounter: Payer: Self-pay | Admitting: Hematology and Oncology

## 2019-08-17 DIAGNOSIS — G620 Drug-induced polyneuropathy: Secondary | ICD-10-CM

## 2019-08-17 DIAGNOSIS — R112 Nausea with vomiting, unspecified: Secondary | ICD-10-CM | POA: Diagnosis not present

## 2019-08-17 DIAGNOSIS — Z95828 Presence of other vascular implants and grafts: Secondary | ICD-10-CM

## 2019-08-17 DIAGNOSIS — C760 Malignant neoplasm of head, face and neck: Secondary | ICD-10-CM

## 2019-08-17 DIAGNOSIS — T451X5A Adverse effect of antineoplastic and immunosuppressive drugs, initial encounter: Secondary | ICD-10-CM

## 2019-08-17 DIAGNOSIS — Z5111 Encounter for antineoplastic chemotherapy: Secondary | ICD-10-CM | POA: Diagnosis not present

## 2019-08-17 DIAGNOSIS — C7800 Secondary malignant neoplasm of unspecified lung: Secondary | ICD-10-CM

## 2019-08-17 DIAGNOSIS — Z7189 Other specified counseling: Secondary | ICD-10-CM

## 2019-08-17 DIAGNOSIS — G893 Neoplasm related pain (acute) (chronic): Secondary | ICD-10-CM

## 2019-08-17 DIAGNOSIS — D61818 Other pancytopenia: Secondary | ICD-10-CM

## 2019-08-17 DIAGNOSIS — C119 Malignant neoplasm of nasopharynx, unspecified: Secondary | ICD-10-CM

## 2019-08-17 LAB — COMPREHENSIVE METABOLIC PANEL
ALT: 37 U/L (ref 0–44)
AST: 25 U/L (ref 15–41)
Albumin: 4.2 g/dL (ref 3.5–5.0)
Alkaline Phosphatase: 63 U/L (ref 38–126)
Anion gap: 11 (ref 5–15)
BUN: 16 mg/dL (ref 6–20)
CO2: 29 mmol/L (ref 22–32)
Calcium: 9.4 mg/dL (ref 8.9–10.3)
Chloride: 99 mmol/L (ref 98–111)
Creatinine, Ser: 0.96 mg/dL (ref 0.61–1.24)
GFR calc Af Amer: >60 mL/min (ref >60)
GFR calc non Af Amer: >60 mL/min (ref >60)
Glucose, Bld: 149 mg/dL — ABNORMAL HIGH (ref 70–99)
Potassium: 3.7 mmol/L (ref 3.5–5.1)
Sodium: 139 mmol/L (ref 135–145)
Total Bilirubin: <0.2 mg/dL — ABNORMAL LOW (ref 0.3–1.2)
Total Protein: 7 g/dL (ref 6.5–8.1)

## 2019-08-17 LAB — CBC WITH DIFFERENTIAL/PLATELET
Abs Immature Granulocytes: 0.21 10*3/uL — ABNORMAL HIGH (ref 0.00–0.07)
Basophils Absolute: 0 10*3/uL (ref 0.0–0.1)
Basophils Relative: 1 %
Eosinophils Absolute: 0 10*3/uL (ref 0.0–0.5)
Eosinophils Relative: 0 %
HCT: 38.1 % — ABNORMAL LOW (ref 39.0–52.0)
Hemoglobin: 12.5 g/dL — ABNORMAL LOW (ref 13.0–17.0)
Immature Granulocytes: 6 %
Lymphocytes Relative: 40 %
Lymphs Abs: 1.5 10*3/uL (ref 0.7–4.0)
MCH: 28.6 pg (ref 26.0–34.0)
MCHC: 32.8 g/dL (ref 30.0–36.0)
MCV: 87.2 fL (ref 80.0–100.0)
Monocytes Absolute: 0.3 10*3/uL (ref 0.1–1.0)
Monocytes Relative: 7 %
Neutro Abs: 1.7 10*3/uL (ref 1.7–7.7)
Neutrophils Relative %: 46 %
Platelets: 387 10*3/uL (ref 150–400)
RBC: 4.37 MIL/uL (ref 4.22–5.81)
RDW: 14.8 % (ref 11.5–15.5)
WBC: 3.7 10*3/uL — ABNORMAL LOW (ref 4.0–10.5)
nRBC: 0 % (ref 0.0–0.2)

## 2019-08-17 LAB — MAGNESIUM: Magnesium: 1.8 mg/dL (ref 1.7–2.4)

## 2019-08-17 LAB — TSH: TSH: 17.249 u[IU]/mL — ABNORMAL HIGH (ref 0.320–4.118)

## 2019-08-17 MED ORDER — DEXAMETHASONE SODIUM PHOSPHATE 10 MG/ML IJ SOLN: 10.0000 mg | Freq: Once | INTRAMUSCULAR | Status: DC

## 2019-08-17 MED ORDER — SODIUM CHLORIDE 0.9 % IV SOLN
Freq: Once | INTRAVENOUS | Status: AC
  Administered 2019-08-17: 11:00:00 via INTRAVENOUS
  Filled 2019-08-17: qty 250

## 2019-08-17 MED ORDER — SODIUM CHLORIDE 0.9 % IV SOLN
1600.0000 mg | Freq: Once | INTRAVENOUS | Status: AC
  Administered 2019-08-17: 1600 mg via INTRAVENOUS
  Filled 2019-08-17: qty 42.08

## 2019-08-17 MED ORDER — SODIUM CHLORIDE 0.9 % IV SOLN: Freq: Once | INTRAVENOUS | Status: DC

## 2019-08-17 MED ORDER — OXYCODONE HCL 10 MG PO TABS: 10.0000 mg | ORAL_TABLET | Freq: Three times a day (TID) | ORAL | 0 refills | Status: DC | PRN

## 2019-08-17 MED ORDER — SODIUM CHLORIDE 0.9 % IV SOLN
Freq: Once | INTRAVENOUS | Status: AC
  Administered 2019-08-17: 12:00:00 via INTRAVENOUS
  Filled 2019-08-17: qty 4

## 2019-08-17 MED ORDER — ANTICOAGULANT SODIUM CITRATE 4% (200MG/5ML) IV SOLN
5.0000 mL | Freq: Once | Status: AC
  Administered 2019-08-17: 5 mL
  Filled 2019-08-17: qty 5

## 2019-08-17 MED ORDER — SODIUM CHLORIDE 0.9% FLUSH
10.0000 mL | INTRAVENOUS | Status: DC | PRN
  Administered 2019-08-17: 10 mL
  Filled 2019-08-17: qty 10

## 2019-08-17 MED ORDER — SODIUM CHLORIDE 0.9% FLUSH
10.0000 mL | Freq: Once | INTRAVENOUS | Status: AC | PRN
  Administered 2019-08-17: 10 mL
  Filled 2019-08-17: qty 10

## 2019-08-17 MED ORDER — ONDANSETRON HCL 4 MG/2ML IJ SOLN: 8.0000 mg | Freq: Once | INTRAMUSCULAR | Status: DC

## 2019-08-17 NOTE — Assessment & Plan Note (Signed)
He has some sensation of indigestion which I suspect is a component of nausea We discussed medical management

## 2019-08-17 NOTE — Assessment & Plan Note (Signed)
He has mild pancytopenia but not symptomatic We will proceed with treatment without delay

## 2019-08-17 NOTE — Progress Notes (Signed)
[facility] OFFICE PROGRESS NOTE  Patient Care Team: Patient, No Pcp Per as PCP - General (General Practice) [name], MD as Consulting Physician (Hematology and Oncology) Eppie Gibson, MD as Attending Physician (Radiation Oncology) Jodi Marble, MD as Consulting Physician (Otolaryngology) Philomena Doheny, MD as Referring Physician (Plastic Surgery) Irene Shipper, MD as Consulting Physician (Gastroenterology) System, Provider Not In  ASSESSMENT & PLAN:  Primary cancer of head and neck [facility]) He tolerated recent chemotherapy well without major side effects His examination is unchanged Per previous discussion, we plan to complete 3 cycles of chemotherapy before repeat imaging study I will see him again in 2 weeks for further follow-up  Pancytopenia, acquired [facility]) He has mild pancytopenia but not symptomatic We will proceed with treatment without delay  Nausea and vomiting He has some sensation of indigestion which I suspect is a component of nausea We discussed medical management  Cancer associated pain He has less pain We will initiate slow taper of oxycodone  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 It is only mild, not bothering the patient. I will observe for now If it gets worse in the future, I will consider modifying the dose of the treatment    No orders of the defined types were placed in this encounter.   INTERVAL HISTORY: Please see below for problem oriented charting. 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 Pan-endoscopy elsewhere showed cancer from nasopharyngeal space.   01/04/2007 - 02/20/2007 Chemotherapy   He received 2 cycles of cisplatin and 5FU followed by concurrent chemo with weekly cisplatin and radiation. He only received 2 doses of chemo due to severe mucositis, nausea and weight loss.   04/05/2007 - 08/04/2007 Chemotherapy   He received 4 more courses of cisplatin with 5FU and had complete response   07/05/2009 Procedure   Fine-needle aspirate of the right level II lymph nodes come from recurrent metastatic disease. Repeat endoscopy and CT scan show no evidence of disease elsewhere.   07/08/2009 - 12/02/2009 Chemotherapy   He was given 6 cycles of carboplatin, 5-FU and docetaxel   12/03/2009 Surgery   He has surgery to the residual lymph node on the right neck which showed no evidence of disease.   02/22/2012 Imaging   Repeat imaging study showed large recurrent mass. He was referred elsewhere for further treatment.   05/03/2012 Surgery   He underwent left upper lobectomy.   04/29/2013 Imaging   PEt scan showed lesion on right level II B and lower lung was abnormal   06/03/2013 - 02/02/2014 Chemotherapy   He had 6 cycles of chemotherapy when he was found to have recurrence of cancer and had received oxaliplatin  and capecitabine   06/07/2014 Imaging   PET CT scan showed persistent disease in the right neck lymph nodes and left lung   06/29/2014 Procedure   Accession: [id number] repeat LUL biopsy confirmed metastatic cancer   07/18/2014 - 07/31/2014 Radiation Therapy   He received palliative radiation therapy to the  lungs   10/10/2014 Imaging   CT scan of the chest, abdomen and pelvis show regression in the size of the lung nodule in the left upper lobe and stable pulmonary nodules   01/24/2015 Imaging   CT scan showed stable disease in neck and lung   06/19/2015 Imaging   CT scan of the neck and the chest show possible mild progression of the nodule in the right side of the neck.   06/25/2015 Imaging   PET scan confirmed disease recurrence in the neck   07/07/2015 Imaging   He had [facility]   09/03/2015 - 08/26/2018 Chemotherapy   He received palliative chemo with Nivolumab   10/29/2015 Imaging   PET CT showed positive response to Rx   02/28/2016 Imaging   Ct abdomen showed abnormal thinkening in his stomach   03/03/2016 Imaging   CT: Right sternocleidomastoid muscle metastasis appears less distinct but otherwise not significantly changed in size or configuration since 06/19/2015.2. Left level 3 lymph node which was hypermetabolic by PET-CT in January 2017 appears slightly smaller   04/01/2016 Imaging   CT cervical spine showed no acute fracture or traumatic malalignment in the cervical spine   04/22/2016 Procedure   Port-a-cath placed.   06/16/2016 Imaging   Ct neck showed right sternocleidomastoid muscle metastasis is further decreased in conspicuity since May, and has mildly decreased in size since September 2016. Continued stability of sub-centimeter left cervical lymph nodes. No new or progressive metastatic disease in the neck.   06/16/2016 Imaging   CT chest showed stable masslike radiation fibrosis in the left upper lobe. Stable subcentimeter pulmonary nodules in the bilateral lower lobes. No new or progressive metastatic disease in the chest. Nonobstructing left renal stone.   10/13/2016 Imaging   Ct neck showed unchanged right sternocleidomastoid muscle metastasis. Unchanged subcentimeter left cervical lymph nodes. No evidence of new or progressive metastatic disease in the neck.    10/13/2016 Imaging   CT chest showed tiny hypervascular foci in the liver, not definitely seen on prior imaging of 06/16/2016 and 02/28/2016. Abdomen MRI without and with contrast recommended to further evaluate as metastatic disease is a concern. 2. Stable appearance of post treatment changes left upper lung and scattered tiny bilateral pulmonary nodules.   02/11/2017 Imaging   Ct neck: Lymph node mass right posterior neck appears improved from the prior study. Small posterior lymph nodes on the left unchanged. Occluded right jugular vein unchanged.   02/11/2017 Imaging   1. Similar appearance of postsurgical and radiation changes in the left upper lobe. 2. Similar bilateral pulmonary nodules. 3. No thoracic adenopathy. 4. Subtle foci of post-contrast enhancement within the liver are suboptimally characterized on this nondedicated study. Likely similar. These could either be re-evaluated at followup or more entirely characterized with abdominal MRI. 5. Left nephrolithiasis.   05/19/2017 Imaging   Matted lymph node mass right posterior neck appears larger in the recent CT. Accurate measurements difficult due to infiltrating tumor margins and infiltration of the muscle. Right jugular vein again appears occluded or resected. Small left posterior lymph nodes stable. Left upper lobe airspace density stable and similar to the prior CT   06/03/2017 PET scan  1. Hypermetabolic ill-defined right level IIb lymph node, about 1.3 cm in diameter with maximum SUV 9.5 (formerly 8.1). Appearance suspicious for residual/recurrent malignancy. No worrisome left-sided lesion. 2. Left suprahilar indistinct opacity demonstrates no worrisome hypermetabolic activity. The 5 mm left lower lobe pulmonary nodule is stable and not currently hypermetabolic although below sensitive PET-CT size thresholds. 3. Other imaging findings of potential clinical significance: Bilateral nonobstructive nephrolithiasis. Chronic bilateral  maxillary sinusitis.   05/11/2018 PET scan   1. Continued chronic accentuated metabolic activity in the vicinity of right level IIB and the adjacent right sternocleidomastoid muscle, with ill definition of surrounding tissue planes. Maximum SUV is currently 8.1, formerly 9.5. Accentuated metabolic activity is been present in this vicinity back through 06/25/2015, and there was also some low-level activity in this vicinity on 06/07/2014. Some of this may be from scarring and local muscular activity although clearly a component of residual tumor is difficult to exclude given the focally high activity. 2. Other imaging findings of potential clinical significance: Chronic bilateral maxillary sinusitis. Chronic scarring in the left upper lobe. Chronically stable 5 mm left lower lobe nodule is considered benign. Nonobstructive left nephrolithiasis.   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 COMMENT:No significant keratinization is identified. Other basaloid carcinomas are in the differential diagnosis. No cell block material is available for further testing.   09/12/2018 Procedure   He underwent fine Needle Aspiration   10/04/2018 PET scan   1. Significant progression of local recurrence laterally in the mid right neck with an enlarging, increasingly hypermetabolic soft tissue mass. This involves the right sternocleidomastoid muscle. 2. Small lymph nodes in the right axilla are increasingly hypermetabolic. These are nonspecific and potentially reactive, although could reflect a small metastases. Small hypermetabolic nodule in the left suprasternal notch is unchanged. 3. No other evidence of metastatic disease.    10/07/2018 - 12/23/2018 Chemotherapy   The patient had cisplatin plus gemzar   12/07/2018 Imaging   1. Decreased size of lateral right neck  mass. 2. Unchanged soft tissue nodule in the suprasternal notch. 3. No evidence of new metastatic disease in the neck.    05/30/2019 Imaging   CT neck No clear change or progression compared to the study of March. Overall measurements of the right lateral neck mass are similar, approximately 3 x 1.8 cm. See above discussion. One could argue that there is slight increase in lateral bulging, possibly with an increase in contrast enhancement, towards the inferior margin. This is of questionable validity but could possibly represent some progression or inflammatory change. Other findings in the region are stable.   07/20/2019 -  Chemotherapy   The patient had palonosetron (ALOXI) injection 0.25 mg, 0.25 mg, Intravenous,  Once, 2 of 4 cycles Administration: 0.25 mg (07/20/2019), 0.25 mg (08/10/2019) CISplatin (PLATINOL) 84 mg in sodium chloride 0.9 % 250 mL chemo infusion, 40 mg/m2 = 84 mg (80 % of original dose 50 mg/m2), Intravenous,  Once, 2 of 4 cycles Dose modification: 40 mg/m2 (80 % of original dose 50 mg/m2, Cycle 1, Reason: Dose Not Tolerated) Administration: 84 mg (07/20/2019), 84 mg (08/10/2019) gemcitabine (GEMZAR) 1,600 mg in sodium chloride 0.9 % 250 mL chemo infusion, 1,672 mg (80 % of original dose 1,000 mg/m2), Intravenous,  Once, 2 of 4 cycles Dose modification: 800 mg/m2 (80 % of original dose 1,000 mg/m2, Cycle 1, Reason: Provider Judgment) Administration: 1,600 mg (07/20/2019), 1,600  mg (07/27/2019), 1,600 mg (08/10/2019) ondansetron (ZOFRAN) 8 mg, dexamethasone (DECADRON) 10 mg in sodium chloride 0.9 % 50 mL IVPB, , Intravenous,  Once, 2 of 4 cycles fosaprepitant (EMEND) 150 mg, dexamethasone (DECADRON) 12 mg in sodium chloride 0.9 % 145 mL IVPB, , Intravenous,  Once, 2 of 4 cycles Administration:  (07/20/2019),  (08/10/2019)  for chemotherapy treatment.      REVIEW OF SYSTEMS:   Constitutional: Denies fevers, chills or abnormal weight loss Eyes: Denies blurriness of  vision Ears, nose, mouth, throat, and face: Denies mucositis or sore throat Respiratory: Denies cough, dyspnea or wheezes Cardiovascular: Denies palpitation, chest discomfort or lower extremity swelling Skin: Denies abnormal skin rashes Lymphatics: Denies new lymphadenopathy or easy bruising Behavioral/Psych: Mood is stable, no new changes  All other systems were reviewed with the patient and are negative.  I have reviewed the past medical history, past surgical history, social history and family history with the patient and they are unchanged from previous note.  ALLERGIES:  is allergic to phenergan [promethazine hcl]; heparin; and clindamycin.  MEDICATIONS:  Current Outpatient Medications  Medication Sig Dispense Refill  . atorvastatin (LIPITOR) 40 MG tablet TK 1 T PO  D    . cetirizine (ZYRTEC) 10 MG tablet Take 10 mg by mouth daily.    Marland Kitchen ibuprofen (ADVIL) 600 MG tablet TK 1 T PO Q 6 H PRN    . levothyroxine (SYNTHROID, LEVOTHROID) 137 MCG tablet TAKE 1 TABLET(137 MCG) BY MOUTH DAILY BEFORE BREAKFAST (Patient taking differently: Take 137 mcg by mouth daily before breakfast. ) 90 tablet 0  . lidocaine (XYLOCAINE) 2 % solution Use as directed 5 mLs in the mouth or throat every 3 (three) hours as needed for mouth pain. Swish, gargle and spit 100 mL 2  . LORazepam (ATIVAN) 1 MG tablet Take 1 tablet (1 mg total) by mouth every 8 (eight) hours as needed for anxiety. 30 tablet 3  . losartan (COZAAR) 25 MG tablet TK 1 T PO  D    . metFORMIN (GLUCOPHAGE) 500 MG tablet TAKE 1 TABLET(500 MG) BY MOUTH DAILY WITH BREAKFAST 90 tablet 1  . mirtazapine (REMERON) 15 MG tablet Take 1 tablet (15 mg total) by mouth at bedtime. 30 tablet 1  . omeprazole (PRILOSEC) 20 MG capsule Take 1 capsule (20 mg total) by mouth daily. 30 capsule 5  . ondansetron (ZOFRAN) 8 MG tablet Take 1 tablet (8 mg total) by mouth every 8 (eight) hours as needed for nausea or vomiting. 90 tablet 1  . Oxycodone HCl 10 MG TABS Take 1  tablet (10 mg total) by mouth every 8 (eight) hours as needed. 60 tablet 0  . PARoxetine (PAXIL) 20 MG tablet Take 1 tablet (20 mg total) by mouth daily. 30 tablet 5  . polyethylene glycol (MIRALAX) packet Take 17 g by mouth daily. (Patient taking differently: Take 17 g by mouth daily as needed for mild constipation or moderate constipation. ) 14 each 3  . prochlorperazine (COMPAZINE) 10 MG tablet Take 1 tablet (10 mg total) by mouth every 12 (twelve) hours as needed (Nausea or vomiting). 30 tablet 1  . senna-docusate (SENOKOT-S) 8.6-50 MG tablet Take 2 tablets by mouth 3 (three) times daily. 90 tablet 0  . Vitamin D, Ergocalciferol, (DRISDOL) 1.25 MG (50000 UT) CAPS capsule TK 1 C PO WEEKLY     No current facility-administered medications for this visit.    Facility-Administered Medications Ordered in Other Visits  Medication Dose Route Frequency Provider Last Rate Last  Dose  . anticoagulant sodium citrate solution 5 mL  5 mL Intracatheter Once Alvy Bimler, Nashea Chumney, MD        PHYSICAL EXAMINATION: ECOG PERFORMANCE STATUS: 1 - Symptomatic but completely ambulatory  Vitals:   08/17/19 1046  BP: 115/61  Pulse: 73  Resp: 18  Temp: 98.3 F (36.8 C)  SpO2: 100%   Filed Weights   08/17/19 1046  Weight: 196 lb 8 oz (89.1 kg)    GENERAL:alert, no distress and comfortable SKIN: skin color, texture, turgor are normal, no rashes or significant lesions EYES: normal, Conjunctiva are pink and non-injected, sclera clear OROPHARYNX:no exudate, no erythema and lips, buccal mucosa, and tongue normal  NECK: Neck is fibrosed from prior surgery and radiation.  The skin nodule is unchanged on exam LYMPH:  no palpable lymphadenopathy in the cervical, axillary or inguinal LUNGS: clear to auscultation and percussion with normal breathing effort HEART: regular rate & rhythm and no murmurs and no lower extremity edema ABDOMEN:abdomen soft, non-tender and normal bowel sounds Musculoskeletal:no cyanosis of digits  and no clubbing  NEURO: alert & oriented x 3 with fluent speech, no focal motor/sensory deficits  LABORATORY DATA:  I have reviewed the data as listed    Component Value Date/Time   NA 139 08/17/2019 1003   NA 139 09/22/2017 0829   K 3.7 08/17/2019 1003   K 3.5 09/22/2017 0829   CL 99 08/17/2019 1003   CO2 29 08/17/2019 1003   CO2 26 09/22/2017 0829   GLUCOSE 149 (H) 08/17/2019 1003   GLUCOSE 133 09/22/2017 0829   BUN 16 08/17/2019 1003   BUN 14.1 09/22/2017 0829   CREATININE 0.96 08/17/2019 1003   CREATININE 0.93 02/06/2019 1215   CREATININE 0.9 09/22/2017 0829   CALCIUM 9.4 08/17/2019 1003   CALCIUM 9.1 09/22/2017 0829   PROT 7.0 08/17/2019 1003   PROT 6.8 09/22/2017 0829   ALBUMIN 4.2 08/17/2019 1003   ALBUMIN 4.1 09/22/2017 0829   AST 25 08/17/2019 1003   AST 31 02/06/2019 1215   AST 22 09/22/2017 0829   ALT 37 08/17/2019 1003   ALT 48 (H) 02/06/2019 1215   ALT 30 09/22/2017 0829   ALKPHOS 63 08/17/2019 1003   ALKPHOS 55 09/22/2017 0829   BILITOT <0.2 (L) 08/17/2019 1003   BILITOT 0.2 (L) 02/06/2019 1215   BILITOT 0.35 09/22/2017 0829   GFRNONAA >60 08/17/2019 1003   GFRNONAA >60 02/06/2019 1215   GFRAA >60 08/17/2019 1003   GFRAA >60 02/06/2019 1215    No results found for: SPEP, UPEP  Lab Results  Component Value Date   WBC 3.7 (L) 08/17/2019   NEUTROABS 1.7 08/17/2019   HGB 12.5 (L) 08/17/2019   HCT 38.1 (L) 08/17/2019   MCV 87.2 08/17/2019   PLT 387 08/17/2019      Chemistry      Component Value Date/Time   NA 139 08/17/2019 1003   NA 139 09/22/2017 0829   K 3.7 08/17/2019 1003   K 3.5 09/22/2017 0829   CL 99 08/17/2019 1003   CO2 29 08/17/2019 1003   CO2 26 09/22/2017 0829   BUN 16 08/17/2019 1003   BUN 14.1 09/22/2017 0829   CREATININE 0.96 08/17/2019 1003   CREATININE 0.93 02/06/2019 1215   CREATININE 0.9 09/22/2017 0829      Component Value Date/Time   CALCIUM 9.4 08/17/2019 1003   CALCIUM 9.1 09/22/2017 0829   ALKPHOS 63  08/17/2019 1003   ALKPHOS 55 09/22/2017 0829   AST 25 08/17/2019  1003   AST 31 02/06/2019 1215   AST 22 09/22/2017 0829   ALT 37 08/17/2019 1003   ALT 48 (H) 02/06/2019 1215   ALT 30 09/22/2017 0829   BILITOT <0.2 (L) 08/17/2019 1003   BILITOT 0.2 (L) 02/06/2019 1215   BILITOT 0.35 09/22/2017 0829       All questions were answered. The patient knows to call the clinic with any problems, questions or concerns. No barriers to learning was detected.  I spent 15 minutes counseling the patient face to face. The total time spent in the appointment was 20 minutes and more than 50% was on counseling and review of test results  [name], MD 08/17/2019 11:13 AM

## 2019-08-17 NOTE — Telephone Encounter (Signed)
Patient reports he has been taking it every day but not at consistent times. Patient understands medication should be taken first thing in the morning on an empty stomach but has failed to do so. He agrees that he has not been compliant with taking this medication and states he will try better. No new prescription sent in per Dr Alvy Bimler. Dose will remain the same at this time.  Patient has been scheduled for IVF tomorrow and Saturday.

## 2019-08-17 NOTE — Assessment & Plan Note (Signed)
He has less pain We will initiate slow taper of oxycodone

## 2019-08-17 NOTE — Assessment & Plan Note (Signed)
he has mild peripheral neuropathy, likely related to side effects of treatment. It is only mild, not bothering the patient. I will observe for now If it gets worse in the future, I will consider modifying the dose of the treatment  

## 2019-08-17 NOTE — Assessment & Plan Note (Signed)
He tolerated recent chemotherapy well without major side effects His examination is unchanged Per previous discussion, we plan to complete 3 cycles of chemotherapy before repeat imaging study I will see him again in 2 weeks for further follow-up

## 2019-08-17 NOTE — Patient Instructions (Signed)
[facility] °Discharge Instructions for Patients Receiving Chemotherapy ° °Today you received the following chemotherapy agents Gemzar ° °To help prevent nausea and vomiting after your treatment, we encourage you to take your nausea medication as directed. °  °If you develop nausea and vomiting that is not controlled by your nausea medication, call the clinic.  ° °BELOW ARE SYMPTOMS THAT SHOULD BE REPORTED IMMEDIATELY: °· *FEVER GREATER THAN 100.5 F °· *CHILLS WITH OR WITHOUT FEVER °· NAUSEA AND VOMITING THAT IS NOT CONTROLLED WITH YOUR NAUSEA MEDICATION °· *UNUSUAL SHORTNESS OF BREATH °· *UNUSUAL BRUISING OR BLEEDING °· TENDERNESS IN MOUTH AND THROAT WITH OR WITHOUT PRESENCE OF ULCERS °· *URINARY PROBLEMS °· *BOWEL PROBLEMS °· UNUSUAL RASH °Items with * indicate a potential emergency and should be followed up as soon as possible. ° °Feel free to call the clinic should you have any questions or concerns. The clinic phone number is [phone]. ° °Please show the CHEMO ALERT CARD at check-in to the Emergency Department and triage nurse. ° ° °

## 2019-08-17 NOTE — Telephone Encounter (Signed)
-----   Message from [name], MD sent at 08/17/2019 11:57 AM EST ----- Regarding: high TSH Can you ask if he is taking his thyroid medicine? If not, remind him it's important to take it It he is taking, pls call in 150 mcg dose to his pharmacy

## 2019-08-18 ENCOUNTER — Other Ambulatory Visit: Payer: Self-pay

## 2019-08-18 ENCOUNTER — Inpatient Hospital Stay: Payer: Medicaid Other

## 2019-08-18 VITALS — BP 118/80 | HR 67 | Temp 99.1°F | Resp 18

## 2019-08-18 DIAGNOSIS — Z95828 Presence of other vascular implants and grafts: Secondary | ICD-10-CM

## 2019-08-18 DIAGNOSIS — C760 Malignant neoplasm of head, face and neck: Secondary | ICD-10-CM

## 2019-08-18 DIAGNOSIS — C7800 Secondary malignant neoplasm of unspecified lung: Secondary | ICD-10-CM

## 2019-08-18 DIAGNOSIS — Z5111 Encounter for antineoplastic chemotherapy: Secondary | ICD-10-CM | POA: Diagnosis not present

## 2019-08-18 MED ORDER — ANTICOAGULANT SODIUM CITRATE 4% (200MG/5ML) IV SOLN
5.0000 mL | Freq: Once | Status: AC
  Administered 2019-08-18: 5 mL
  Filled 2019-08-18: qty 5

## 2019-08-18 MED ORDER — SODIUM CHLORIDE 0.9% FLUSH
10.0000 mL | Freq: Once | INTRAVENOUS | Status: AC
  Administered 2019-08-18: 10 mL
  Filled 2019-08-18: qty 10

## 2019-08-18 MED ORDER — ONDANSETRON HCL 4 MG/2ML IJ SOLN
INTRAMUSCULAR | Status: AC
  Filled 2019-08-18: qty 4

## 2019-08-18 MED ORDER — ONDANSETRON HCL 4 MG/2ML IJ SOLN
8.0000 mg | Freq: Once | INTRAMUSCULAR | Status: AC
  Administered 2019-08-18: 8 mg via INTRAVENOUS

## 2019-08-18 MED ORDER — SODIUM CHLORIDE 0.9 % IV SOLN
Freq: Once | INTRAVENOUS | Status: AC
  Administered 2019-08-18: 16:00:00 via INTRAVENOUS
  Filled 2019-08-18: qty 250

## 2019-08-18 NOTE — Progress Notes (Signed)
Patient received 755mLs NS and then stated that he was feeling anxious and needed to leave and go home. The infusion was stopped at that time and the patient was dc'd home.

## 2019-08-18 NOTE — Patient Instructions (Addendum)
Dehydration, Adult  Dehydration is when there is not enough fluid or water in your body. This happens when you lose more fluids than you take in. Dehydration can range from mild to very bad. It should be treated right away to keep it from getting very bad. Symptoms of mild dehydration may include:  Thirst.  Dry lips.  Slightly dry mouth.  Dry, warm skin.  Dizziness. Symptoms of moderate dehydration may include:  Very dry mouth.  Muscle cramps.  Dark pee (urine). Pee may be the color of tea.  Your body making less pee.  Your eyes making fewer tears.  Heartbeat that is uneven or faster than normal (palpitations).  Headache.  Light-headedness, especially when you stand up from sitting.  Fainting (syncope). Symptoms of very bad dehydration may include:  Changes in skin, such as: ? Cold and clammy skin. ? Blotchy (mottled) or pale skin. ? Skin that does not quickly return to normal after being lightly pinched and let go (poor skin turgor).  Changes in body fluids, such as: ? Feeling very thirsty. ? Your eyes making fewer tears. ? Not sweating when body temperature is high, such as in hot weather. ? Your body making very little pee.  Changes in vital signs, such as: ? Weak pulse. ? Pulse that is more than 100 beats a minute when you are sitting still. ? Fast breathing. ? Low blood pressure.  Other changes, such as: ? Sunken eyes. ? Cold hands and feet. ? Confusion. ? Lack of energy (lethargy). ? Trouble waking up from sleep. ? Short-term weight loss. ? Unconsciousness. Follow these instructions at home:   If told by your doctor, drink an ORS: ? Make an ORS by using instructions on the package. ? Start by drinking small amounts, about  cup (120 mL) every 5-10 minutes. ? Slowly drink more until you have had the amount that your doctor said to have.  Drink enough clear fluid to keep your pee clear or pale yellow. If you were told to drink an ORS, finish the  ORS first, then start slowly drinking clear fluids. Drink fluids such as: ? Water. Do not drink only water by itself. Doing that can make the salt (sodium) level in your body get too low (hyponatremia). ? Ice chips. ? Fruit juice that you have added water to (diluted). ? Low-calorie sports drinks.  Avoid: ? Alcohol. ? Drinks that have a lot of sugar. These include high-calorie sports drinks, fruit juice that does not have water added, and soda. ? Caffeine. ? Foods that are greasy or have a lot of fat or sugar.  Take over-the-counter and prescription medicines only as told by your doctor.  Do not take salt tablets. Doing that can make the salt level in your body get too high (hypernatremia).  Eat foods that have minerals (electrolytes). Examples include bananas, oranges, potatoes, tomatoes, and spinach.  Keep all follow-up visits as told by your doctor. This is important. Contact a doctor if:  You have belly (abdominal) pain that: ? Gets worse. ? Stays in one area (localizes).  You have a rash.  You have a stiff neck.  You get angry or annoyed more easily than normal (irritability).  You are more sleepy than normal.  You have a harder time waking up than normal.  You feel: ? Weak. ? Dizzy. ? Very thirsty.  You have peed (urinated) only a small amount of very dark pee during 6-8 hours. Get help right away if:  You have  symptoms of very bad dehydration.  You cannot drink fluids without throwing up (vomiting).  Your symptoms get worse with treatment.  You have a fever.  You have a very bad headache.  You are throwing up or having watery poop (diarrhea) and it: ? Gets worse. ? Does not go away.  You have blood or something green (bile) in your throw-up.  You have blood in your poop (stool). This may cause poop to look black and tarry.  You have not peed in 6-8 hours.  You pass out (faint).  Your heart rate when you are sitting still is more than 100 beats a  minute.  You have trouble breathing. This information is not intended to replace advice given to you by your health care provider. Make sure you discuss any questions you have with your health care provider. Document Released: 07/18/2009 Document Revised: 09/03/2017 Document Reviewed: 11/15/2015 Elsevier Patient Education  2020 [place] Diet A bland diet consists of foods that are often soft and do not have a lot of fat, fiber, or extra seasonings. Foods without fat, fiber, or seasoning are easier for the body to digest. They are also less likely to irritate your mouth, throat, stomach, and other parts of your digestive system. A bland diet is sometimes called a BRAT diet. What is my plan? Your health care provider or food and nutrition specialist (dietitian) may recommend specific changes to your diet to prevent symptoms or to treat your symptoms. These changes may include:  Eating small meals often.  Cooking food until it is soft enough to chew easily.  Chewing your food well.  Drinking fluids slowly.  Not eating foods that are very spicy, sour, or fatty.  Not eating citrus fruits, such as oranges and grapefruit. What do I need to know about this diet?  Eat a variety of foods from the bland diet food list.  Do not follow a bland diet longer than needed.  Ask your health care provider whether you should take vitamins or supplements. What foods can I eat? Grains  Hot cereals, such as cream of wheat. Rice. Bread, crackers, or tortillas made from refined white flour. Vegetables Canned or cooked vegetables. Mashed or boiled potatoes. Fruits  Bananas. Applesauce. Other types of cooked or canned fruit with the skin and seeds removed, such as canned peaches or pears. Meats and other proteins  Scrambled eggs. Creamy peanut butter or other nut butters. Lean, well-cooked meats, such as chicken or fish. Tofu. Soups or broths. Dairy Low-fat dairy products, such as milk,  cottage cheese, or yogurt. Beverages  Water. Herbal tea. Apple juice. Fats and oils Mild salad dressings. Canola or olive oil. Sweets and desserts Pudding. Custard. Fruit gelatin. Ice cream. The items listed above may not be a complete list of recommended foods and beverages. Contact a dietitian for more options. What foods are not recommended? Grains Whole grain breads and cereals. Vegetables Raw vegetables. Fruits Raw fruits, especially citrus, berries, or dried fruits. Dairy Whole fat dairy foods. Beverages Caffeinated drinks. Alcohol. Seasonings and condiments Strongly flavored seasonings or condiments. Hot sauce. Salsa. Other foods Spicy foods. Fried foods. Sour foods, such as pickled or fermented foods. Foods with high sugar content. Foods high in fiber. The items listed above may not be a complete list of foods and beverages to avoid. Contact a dietitian for more information. Summary  A bland diet consists of foods that are often soft and do not have a lot of fat, fiber, or  extra seasonings.  Foods without fat, fiber, or seasoning are easier for the body to digest.  Check with your health care provider to see how long you should follow this diet plan. It is not meant to be followed for long periods. This information is not intended to replace advice given to you by your health care provider. Make sure you discuss any questions you have with your health care provider. Document Released: 01/13/2016 Document Revised: 10/20/2017 Document Reviewed: 10/20/2017 Elsevier Patient Education  2020 [employer].

## 2019-08-19 ENCOUNTER — Inpatient Hospital Stay: Payer: Medicaid Other

## 2019-08-29 ENCOUNTER — Telehealth: Payer: Self-pay | Admitting: Hematology and Oncology

## 2019-08-29 ENCOUNTER — Telehealth: Payer: Self-pay

## 2019-08-29 ENCOUNTER — Other Ambulatory Visit: Payer: Self-pay | Admitting: Hematology and Oncology

## 2019-08-29 NOTE — Telephone Encounter (Signed)
He called and left a message. He would like to keep tomorrow appts and reschedule chemo from Friday to Monday. So he can have more time with his family. If that is a option?

## 2019-08-29 NOTE — Telephone Encounter (Signed)
I sent scheduling msg 

## 2019-08-29 NOTE — Telephone Encounter (Signed)
Scheduled appt per 11/24 sch message - unable to reach pt . Left message with appt date and time .

## 2019-08-29 NOTE — Telephone Encounter (Signed)
Infusion room is overbooked until next Friday If that's what he wants, then I suggest we cancel all his appt this week I can see him with labs on Thursday, he can get cisplatin on Friday next week

## 2019-08-29 NOTE — Telephone Encounter (Signed)
Called and given below message. He verbalized understanding. He would like to move all appts as suggested. Told him scheduling would call to reschedule.

## 2019-08-30 ENCOUNTER — Other Ambulatory Visit: Payer: Medicaid Other

## 2019-08-30 ENCOUNTER — Ambulatory Visit: Payer: Medicaid Other | Admitting: Hematology and Oncology

## 2019-09-01 ENCOUNTER — Ambulatory Visit: Payer: Medicaid Other

## 2019-09-02 ENCOUNTER — Ambulatory Visit: Payer: Medicaid Other

## 2019-09-07 ENCOUNTER — Encounter: Payer: Self-pay | Admitting: Hematology and Oncology

## 2019-09-07 ENCOUNTER — Other Ambulatory Visit: Payer: Self-pay

## 2019-09-07 ENCOUNTER — Inpatient Hospital Stay: Payer: Medicaid Other

## 2019-09-07 ENCOUNTER — Inpatient Hospital Stay: Payer: Medicaid Other | Attending: Hematology and Oncology | Admitting: Hematology and Oncology

## 2019-09-07 DIAGNOSIS — E039 Hypothyroidism, unspecified: Secondary | ICD-10-CM | POA: Insufficient documentation

## 2019-09-07 DIAGNOSIS — Z5111 Encounter for antineoplastic chemotherapy: Secondary | ICD-10-CM | POA: Diagnosis not present

## 2019-09-07 DIAGNOSIS — C119 Malignant neoplasm of nasopharynx, unspecified: Secondary | ICD-10-CM

## 2019-09-07 DIAGNOSIS — G893 Neoplasm related pain (acute) (chronic): Secondary | ICD-10-CM | POA: Diagnosis not present

## 2019-09-07 DIAGNOSIS — R112 Nausea with vomiting, unspecified: Secondary | ICD-10-CM | POA: Diagnosis not present

## 2019-09-07 DIAGNOSIS — C7800 Secondary malignant neoplasm of unspecified lung: Secondary | ICD-10-CM

## 2019-09-07 DIAGNOSIS — K769 Liver disease, unspecified: Secondary | ICD-10-CM | POA: Diagnosis not present

## 2019-09-07 DIAGNOSIS — Z7189 Other specified counseling: Secondary | ICD-10-CM | POA: Diagnosis not present

## 2019-09-07 DIAGNOSIS — C760 Malignant neoplasm of head, face and neck: Secondary | ICD-10-CM

## 2019-09-07 LAB — CBC WITH DIFFERENTIAL/PLATELET
Abs Immature Granulocytes: 0.04 10*3/uL (ref 0.00–0.07)
Basophils Absolute: 0 10*3/uL (ref 0.0–0.1)
Basophils Relative: 1 %
Eosinophils Absolute: 0.2 10*3/uL (ref 0.0–0.5)
Eosinophils Relative: 4 %
HCT: 39.8 % (ref 39.0–52.0)
Hemoglobin: 13.2 g/dL (ref 13.0–17.0)
Immature Granulocytes: 1 %
Lymphocytes Relative: 29 %
Lymphs Abs: 1.5 10*3/uL (ref 0.7–4.0)
MCH: 28.9 pg (ref 26.0–34.0)
MCHC: 33.2 g/dL (ref 30.0–36.0)
MCV: 87.3 fL (ref 80.0–100.0)
Monocytes Absolute: 0.7 10*3/uL (ref 0.1–1.0)
Monocytes Relative: 15 %
Neutro Abs: 2.5 10*3/uL (ref 1.7–7.7)
Neutrophils Relative %: 50 %
Platelets: 322 10*3/uL (ref 150–400)
RBC: 4.56 MIL/uL (ref 4.22–5.81)
RDW: 16.4 % — ABNORMAL HIGH (ref 11.5–15.5)
WBC: 5 10*3/uL (ref 4.0–10.5)
nRBC: 0 % (ref 0.0–0.2)

## 2019-09-07 LAB — COMPREHENSIVE METABOLIC PANEL
ALT: 35 U/L (ref 0–44)
AST: 26 U/L (ref 15–41)
Albumin: 4.3 g/dL (ref 3.5–5.0)
Alkaline Phosphatase: 65 U/L (ref 38–126)
Anion gap: 8 (ref 5–15)
BUN: 12 mg/dL (ref 6–20)
CO2: 29 mmol/L (ref 22–32)
Calcium: 9.2 mg/dL (ref 8.9–10.3)
Chloride: 103 mmol/L (ref 98–111)
Creatinine, Ser: 1.09 mg/dL (ref 0.61–1.24)
GFR calc Af Amer: >60 mL/min (ref >60)
GFR calc non Af Amer: >60 mL/min (ref >60)
Glucose, Bld: 133 mg/dL — ABNORMAL HIGH (ref 70–99)
Potassium: 3.8 mmol/L (ref 3.5–5.1)
Sodium: 140 mmol/L (ref 135–145)
Total Bilirubin: 0.3 mg/dL (ref 0.3–1.2)
Total Protein: 7.1 g/dL (ref 6.5–8.1)

## 2019-09-07 LAB — MAGNESIUM: Magnesium: 1.8 mg/dL (ref 1.7–2.4)

## 2019-09-07 LAB — TSH: TSH: 3.068 u[IU]/mL (ref 0.320–4.118)

## 2019-09-07 NOTE — Assessment & Plan Note (Signed)
He tolerated recent chemotherapy well without major side effects His examination is unchanged Per previous discussion, we plan to complete 3 cycles of chemotherapy before repeat imaging study I will see him again next week for further follow-up

## 2019-09-07 NOTE — Assessment & Plan Note (Signed)
He has history of nausea vomiting He will receive additional IV fluid support after chemotherapy along with antiemetics as needed

## 2019-09-07 NOTE — Assessment & Plan Note (Signed)
He has less pain He will continue oxycodone taper

## 2019-09-07 NOTE — Progress Notes (Signed)
[facility] OFFICE PROGRESS NOTE  Patient Care Team: Patient, No Pcp Per as PCP - General (General Practice) [name], MD as Consulting Physician (Hematology and Oncology) Eppie Gibson, MD as Attending Physician (Radiation Oncology) Jodi Marble, MD as Consulting Physician (Otolaryngology) Philomena Doheny, MD as Referring Physician (Plastic Surgery) Irene Shipper, MD as Consulting Physician (Gastroenterology) System, Provider Not In  ASSESSMENT & PLAN:  Primary cancer of head and neck [facility]) He tolerated recent chemotherapy well without major side effects His examination is unchanged Per previous discussion, we plan to complete 3 cycles of chemotherapy before repeat imaging study I will see him again next week for further follow-up  Cancer associated pain He has less pain He will continue oxycodone taper  Acquired hypothyroidism TSH is pending I will continue to adjust his thyroid medicine as needed  Nausea and vomiting He has history of nausea vomiting He will receive additional IV fluid support after chemotherapy along with antiemetics as needed   No orders of the defined types were placed in this encounter.   INTERVAL HISTORY: Please see below for problem oriented charting. 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 Pan-endoscopy elsewhere showed cancer from nasopharyngeal space.   01/04/2007 - 02/20/2007 Chemotherapy   He received 2 cycles of cisplatin and 5FU followed by concurrent chemo with weekly cisplatin and radiation. He only received 2 doses of chemo due to severe mucositis, nausea and weight loss.   04/05/2007 - 08/04/2007 Chemotherapy   He received 4 more courses of cisplatin with 5FU and had complete response   07/05/2009 Procedure   Fine-needle aspirate of the right level II lymph nodes come from recurrent metastatic disease. Repeat endoscopy and CT scan show no evidence of disease elsewhere.   07/08/2009 - 12/02/2009 Chemotherapy   He was given 6 cycles of carboplatin, 5-FU and docetaxel   12/03/2009 Surgery   He has surgery to the residual lymph node on the right neck which showed no evidence of disease.   02/22/2012 Imaging   Repeat imaging study showed large recurrent mass. He was referred elsewhere for further treatment.   05/03/2012 Surgery   He underwent left upper lobectomy.   04/29/2013 Imaging   PEt scan showed lesion on right level II B and lower lung was abnormal   06/03/2013 - 02/02/2014 Chemotherapy   He had 6 cycles of chemotherapy when he was found to have recurrence of cancer and had received oxaliplatin and capecitabine   06/07/2014 Imaging   PET CT scan showed persistent disease in the right neck lymph nodes and left lung   06/29/2014 Procedure   Accession: [id number] repeat LUL biopsy confirmed metastatic cancer   07/18/2014 - 07/31/2014 Radiation Therapy   He received palliative radiation therapy to the lungs   10/10/2014  Imaging   CT scan of the chest, abdomen and pelvis show regression in the size of the lung nodule in the left upper lobe and stable pulmonary nodules   01/24/2015 Imaging   CT scan showed stable disease in neck and lung   06/19/2015 Imaging   CT scan of the neck and the chest show possible mild progression of  the nodule in the right side of the neck.   06/25/2015 Imaging   PET scan confirmed disease recurrence in the neck   07/07/2015 Imaging   He [facility]   09/03/2015 - 08/26/2018 Chemotherapy   He received palliative chemo with Nivolumab   10/29/2015 Imaging   PET CT showed positive response to Rx   02/28/2016 Imaging   Ct abdomen showed abnormal thinkening in his stomach   03/03/2016 Imaging   CT: Right sternocleidomastoid muscle metastasis appears less distinct but otherwise not significantly changed in size or configuration since 06/19/2015.2. Left level 3 lymph node which was hypermetabolic by PET-CT in January 2017 appears slightly smaller   04/01/2016 Imaging   CT cervical spine showed no acute fracture or traumatic malalignment in the cervical spine   04/22/2016 Procedure   Port-a-cath placed.   06/16/2016 Imaging   Ct neck showed right sternocleidomastoid muscle metastasis is further decreased in conspicuity since May, and has mildly decreased in size since September 2016. Continued stability of sub-centimeter left cervical lymph nodes. No new or progressive metastatic disease in the neck.   06/16/2016 Imaging   CT chest showed stable masslike radiation fibrosis in the left upper lobe. Stable subcentimeter pulmonary nodules in the bilateral lower lobes. No new or progressive metastatic disease in the chest. Nonobstructing left renal stone.   10/13/2016 Imaging   Ct neck showed unchanged right sternocleidomastoid muscle metastasis. Unchanged subcentimeter left cervical lymph nodes. No evidence of new or progressive metastatic disease in the neck.   10/13/2016 Imaging   CT chest showed tiny hypervascular foci in the liver, not definitely seen on prior imaging of 06/16/2016 and 02/28/2016. Abdomen MRI without and with contrast recommended to further evaluate as metastatic disease is a concern. 2. Stable appearance of post treatment changes left upper lung and scattered tiny  bilateral pulmonary nodules.   02/11/2017 Imaging   Ct neck: Lymph node mass right posterior neck appears improved from the prior study. Small posterior lymph nodes on the left unchanged. Occluded right jugular vein unchanged.   02/11/2017 Imaging   1. Similar appearance of postsurgical and radiation changes in the left upper lobe. 2. Similar bilateral pulmonary nodules. 3. No thoracic adenopathy. 4. Subtle foci of post-contrast enhancement within the liver are suboptimally characterized on this nondedicated study. Likely similar. These could either be re-evaluated at followup or more entirely characterized with abdominal MRI. 5. Left nephrolithiasis.   05/19/2017 Imaging   Matted lymph node mass right posterior neck appears larger in the recent CT. Accurate measurements difficult due to infiltrating tumor margins and infiltration of the muscle. Right jugular vein again appears occluded or resected. Small left posterior lymph nodes stable. Left upper lobe airspace density stable and similar to the prior CT   06/03/2017 PET scan   1. Hypermetabolic ill-defined right level IIb lymph node, about 1.3 cm in diameter with maximum SUV 9.5 (formerly 8.1). Appearance suspicious for residual/recurrent malignancy. No worrisome left-sided lesion. 2. Left suprahilar indistinct opacity demonstrates no worrisome hypermetabolic activity. The 5 mm left lower lobe pulmonary nodule is stable and not currently hypermetabolic although below sensitive  PET-CT size thresholds. 3. Other imaging findings of potential clinical significance: Bilateral nonobstructive nephrolithiasis. Chronic bilateral maxillary sinusitis.   05/11/2018 PET scan   1. Continued chronic accentuated metabolic activity in the vicinity of right level IIB and the adjacent right sternocleidomastoid muscle, with ill definition of surrounding tissue planes. Maximum SUV is currently 8.1, formerly 9.5. Accentuated metabolic activity is been present in this  vicinity back through 06/25/2015, and there was also some low-level activity in this vicinity on 06/07/2014. Some of this may be from scarring and local muscular activity although clearly a component of residual tumor is difficult to exclude given the focally high activity. 2. Other imaging findings of potential clinical significance: Chronic bilateral maxillary sinusitis. Chronic scarring in the left upper lobe. Chronically stable 5 mm left lower lobe nodule is considered benign. Nonobstructive left nephrolithiasis.   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 COMMENT:No significant keratinization is identified. Other basaloid carcinomas are in the differential diagnosis. No cell block material is available for further testing.   09/12/2018 Procedure   He underwent fine Needle Aspiration   10/04/2018 PET scan   1. Significant progression of local recurrence laterally in the mid right neck with an enlarging, increasingly hypermetabolic soft tissue mass. This involves the right sternocleidomastoid muscle. 2. Small lymph nodes in the right axilla are increasingly hypermetabolic. These are nonspecific and potentially reactive, although could reflect a small metastases. Small hypermetabolic nodule in the left suprasternal notch is unchanged. 3. No other evidence of metastatic disease.    10/07/2018 - 12/23/2018 Chemotherapy   The patient had cisplatin plus gemzar   12/07/2018 Imaging   1. Decreased size of lateral right neck mass. 2. Unchanged soft tissue nodule in the suprasternal notch. 3. No evidence of new metastatic disease in the neck.    05/30/2019 Imaging   CT neck No clear change or progression compared to the study of March. Overall measurements of the right lateral neck mass are similar, approximately 3 x 1.8 cm. See above discussion. One  could argue that there is slight increase in lateral bulging, possibly with an increase in contrast enhancement, towards the inferior margin. This is of questionable validity but could possibly represent some progression or inflammatory change. Other findings in the region are stable.   07/20/2019 -  Chemotherapy   The patient had palonosetron (ALOXI) injection 0.25 mg, 0.25 mg, Intravenous,  Once, 2 of 4 cycles Administration: 0.25 mg (07/20/2019), 0.25 mg (08/10/2019) CISplatin (PLATINOL) 84 mg in sodium chloride 0.9 % 250 mL chemo infusion, 40 mg/m2 = 84 mg (80 % of original dose 50 mg/m2), Intravenous,  Once, 2 of 4 cycles Dose modification: 40 mg/m2 (80 % of original dose 50 mg/m2, Cycle 1, Reason: Dose Not Tolerated) Administration: 84 mg (07/20/2019), 84 mg (08/10/2019) gemcitabine (GEMZAR) 1,600 mg in sodium chloride 0.9 % 250 mL chemo infusion, 1,672 mg (80 % of original dose 1,000 mg/m2), Intravenous,  Once, 2 of 4 cycles Dose modification: 800 mg/m2 (80 % of original dose 1,000 mg/m2, Cycle 1, Reason: Provider Judgment) Administration: 1,600 mg (07/20/2019), 1,600 mg (07/27/2019), 1,600 mg (08/10/2019), 1,600 mg (08/17/2019) ondansetron (ZOFRAN) 8 mg, dexamethasone (DECADRON) 10 mg in sodium chloride 0.9 % 50 mL IVPB, , Intravenous,  Once, 2 of 4 cycles fosaprepitant (EMEND) 150 mg, dexamethasone (DECADRON) 12 mg in sodium chloride 0.9 % 145 mL IVPB, , Intravenous,  Once, 2 of 4  cycles Administration:  (07/20/2019),  (08/10/2019)  for chemotherapy treatment.      REVIEW OF SYSTEMS:   Constitutional: Denies fevers, chills or abnormal weight loss Eyes: Denies blurriness of vision Ears, nose, mouth, throat, and face: Denies mucositis or sore throat Respiratory: Denies cough, dyspnea or wheezes Cardiovascular: Denies palpitation, chest discomfort or lower extremity swelling Gastrointestinal:  Denies nausea, heartburn or change in bowel habits Skin: Denies abnormal skin rashes Lymphatics:  Denies new lymphadenopathy or easy bruising Neurological:Denies numbness, tingling or new weaknesses Behavioral/Psych: Mood is stable, no new changes  All other systems were reviewed with the patient and are negative.  I have reviewed the past medical history, past surgical history, social history and family history with the patient and they are unchanged from previous note.  ALLERGIES:  is allergic to phenergan [promethazine hcl]; heparin; and clindamycin.  MEDICATIONS:  Current Outpatient Medications  Medication Sig Dispense Refill  . atorvastatin (LIPITOR) 40 MG tablet TK 1 T PO  D    . cetirizine (ZYRTEC) 10 MG tablet Take 10 mg by mouth daily.    Marland Kitchen ibuprofen (ADVIL) 600 MG tablet TK 1 T PO Q 6 H PRN    . levothyroxine (SYNTHROID, LEVOTHROID) 137 MCG tablet TAKE 1 TABLET(137 MCG) BY MOUTH DAILY BEFORE BREAKFAST (Patient taking differently: Take 137 mcg by mouth daily before breakfast. ) 90 tablet 0  . lidocaine (XYLOCAINE) 2 % solution Use as directed 5 mLs in the mouth or throat every 3 (three) hours as needed for mouth pain. Swish, gargle and spit 100 mL 2  . LORazepam (ATIVAN) 1 MG tablet Take 1 tablet (1 mg total) by mouth every 8 (eight) hours as needed for anxiety. 30 tablet 3  . losartan (COZAAR) 25 MG tablet TK 1 T PO  D    . metFORMIN (GLUCOPHAGE) 500 MG tablet TAKE 1 TABLET(500 MG) BY MOUTH DAILY WITH BREAKFAST 90 tablet 1  . mirtazapine (REMERON) 15 MG tablet Take 1 tablet (15 mg total) by mouth at bedtime. 30 tablet 1  . omeprazole (PRILOSEC) 20 MG capsule Take 1 capsule (20 mg total) by mouth daily. 30 capsule 5  . ondansetron (ZOFRAN) 8 MG tablet Take 1 tablet (8 mg total) by mouth every 8 (eight) hours as needed for nausea or vomiting. 90 tablet 1  . Oxycodone HCl 10 MG TABS Take 1 tablet (10 mg total) by mouth every 8 (eight) hours as needed. 60 tablet 0  . PARoxetine (PAXIL) 20 MG tablet Take 1 tablet (20 mg total) by mouth daily. 30 tablet 5  . polyethylene glycol  (MIRALAX) packet Take 17 g by mouth daily. (Patient taking differently: Take 17 g by mouth daily as needed for mild constipation or moderate constipation. ) 14 each 3  . prochlorperazine (COMPAZINE) 10 MG tablet Take 1 tablet (10 mg total) by mouth every 12 (twelve) hours as needed (Nausea or vomiting). 30 tablet 1  . senna-docusate (SENOKOT-S) 8.6-50 MG tablet Take 2 tablets by mouth 3 (three) times daily. 90 tablet 0  . Vitamin D, Ergocalciferol, (DRISDOL) 1.25 MG (50000 UT) CAPS capsule TK 1 C PO WEEKLY     No current facility-administered medications for this visit.    Facility-Administered Medications Ordered in Other Visits  Medication Dose Route Frequency Provider Last Rate Last Dose  . anticoagulant sodium citrate solution 5 mL  5 mL Intracatheter Once [name], MD        PHYSICAL EXAMINATION: ECOG PERFORMANCE STATUS: 1 - Symptomatic but completely ambulatory  Vitals:   09/07/19 1122  BP: 125/82  Pulse: 90  Resp: 18  Temp: 98 F (36.7 C)  SpO2: 100%   Filed Weights   09/07/19 1122  Weight: 196 lb (88.9 kg)    GENERAL:alert, no distress and comfortable SKIN: skin color, texture, turgor are normal, no rashes or significant lesions EYES: normal, Conjunctiva are pink and non-injected, sclera clear OROPHARYNX:no exudate, no erythema and lips, buccal mucosa, and tongue normal  His neck is thick and fibrosed from prior treatment  LYMPH:  no palpable lymphadenopathy in the cervical, axillary or inguinal LUNGS: clear to auscultation and percussion with normal breathing effort HEART: regular rate & rhythm and no murmurs and no lower extremity edema ABDOMEN:abdomen soft, non-tender and normal bowel sounds Musculoskeletal:no cyanosis of digits and no clubbing  NEURO: alert & oriented x 3 with fluent speech, no focal motor/sensory deficits  LABORATORY DATA:  I have reviewed the data as listed    Component Value Date/Time   NA 140 09/07/2019 1131   NA 139 09/22/2017 0829    K 3.8 09/07/2019 1131   K 3.5 09/22/2017 0829   CL 103 09/07/2019 1131   CO2 29 09/07/2019 1131   CO2 26 09/22/2017 0829   GLUCOSE 133 (H) 09/07/2019 1131   GLUCOSE 133 09/22/2017 0829   BUN 12 09/07/2019 1131   BUN 14.1 09/22/2017 0829   CREATININE 1.09 09/07/2019 1131   CREATININE 0.93 02/06/2019 1215   CREATININE 0.9 09/22/2017 0829   CALCIUM 9.2 09/07/2019 1131   CALCIUM 9.1 09/22/2017 0829   PROT 7.1 09/07/2019 1131   PROT 6.8 09/22/2017 0829   ALBUMIN 4.3 09/07/2019 1131   ALBUMIN 4.1 09/22/2017 0829   AST 26 09/07/2019 1131   AST 31 02/06/2019 1215   AST 22 09/22/2017 0829   ALT 35 09/07/2019 1131   ALT 48 (H) 02/06/2019 1215   ALT 30 09/22/2017 0829   ALKPHOS 65 09/07/2019 1131   ALKPHOS 55 09/22/2017 0829   BILITOT 0.3 09/07/2019 1131   BILITOT 0.2 (L) 02/06/2019 1215   BILITOT 0.35 09/22/2017 0829   GFRNONAA >60 09/07/2019 1131   GFRNONAA >60 02/06/2019 1215   GFRAA >60 09/07/2019 1131   GFRAA >60 02/06/2019 1215    No results found for: SPEP, UPEP  Lab Results  Component Value Date   WBC 5.0 09/07/2019   NEUTROABS 2.5 09/07/2019   HGB 13.2 09/07/2019   HCT 39.8 09/07/2019   MCV 87.3 09/07/2019   PLT 322 09/07/2019      Chemistry      Component Value Date/Time   NA 140 09/07/2019 1131   NA 139 09/22/2017 0829   K 3.8 09/07/2019 1131   K 3.5 09/22/2017 0829   CL 103 09/07/2019 1131   CO2 29 09/07/2019 1131   CO2 26 09/22/2017 0829   BUN 12 09/07/2019 1131   BUN 14.1 09/22/2017 0829   CREATININE 1.09 09/07/2019 1131   CREATININE 0.93 02/06/2019 1215   CREATININE 0.9 09/22/2017 0829      Component Value Date/Time   CALCIUM 9.2 09/07/2019 1131   CALCIUM 9.1 09/22/2017 0829   ALKPHOS 65 09/07/2019 1131   ALKPHOS 55 09/22/2017 0829   AST 26 09/07/2019 1131   AST 31 02/06/2019 1215   AST 22 09/22/2017 0829   ALT 35 09/07/2019 1131   ALT 48 (H) 02/06/2019 1215   ALT 30 09/22/2017 0829   BILITOT 0.3 09/07/2019 1131   BILITOT 0.2 (L)  02/06/2019 1215   BILITOT 0.35  09/22/2017 0829       All questions were answered. The patient knows to call the clinic with any problems, questions or concerns. No barriers to learning was detected.  I spent 15 minutes counseling the patient face to face. The total time spent in the appointment was 20 minutes and more than 50% was on counseling and review of test results  [name], MD 09/07/2019 12:44 PM

## 2019-09-07 NOTE — Assessment & Plan Note (Signed)
TSH is pending I will continue to adjust his thyroid medicine as needed

## 2019-09-08 ENCOUNTER — Other Ambulatory Visit: Payer: Self-pay

## 2019-09-08 ENCOUNTER — Inpatient Hospital Stay: Payer: Medicaid Other

## 2019-09-08 ENCOUNTER — Ambulatory Visit: Payer: Medicaid Other | Admitting: Hematology and Oncology

## 2019-09-08 ENCOUNTER — Other Ambulatory Visit: Payer: Medicaid Other

## 2019-09-08 ENCOUNTER — Ambulatory Visit: Payer: Medicaid Other

## 2019-09-08 VITALS — BP 131/85 | HR 90 | Temp 98.3°F | Resp 19

## 2019-09-08 DIAGNOSIS — C760 Malignant neoplasm of head, face and neck: Secondary | ICD-10-CM

## 2019-09-08 DIAGNOSIS — Z95828 Presence of other vascular implants and grafts: Secondary | ICD-10-CM

## 2019-09-08 DIAGNOSIS — Z7189 Other specified counseling: Secondary | ICD-10-CM

## 2019-09-08 DIAGNOSIS — Z5111 Encounter for antineoplastic chemotherapy: Secondary | ICD-10-CM | POA: Diagnosis not present

## 2019-09-08 MED ORDER — POTASSIUM CHLORIDE 2 MEQ/ML IV SOLN
Freq: Once | INTRAVENOUS | Status: AC
  Administered 2019-09-08: 09:00:00 via INTRAVENOUS
  Filled 2019-09-08: qty 10

## 2019-09-08 MED ORDER — SODIUM CHLORIDE 0.9 % IV SOLN
1600.0000 mg | Freq: Once | INTRAVENOUS | Status: AC
  Administered 2019-09-08: 1600 mg via INTRAVENOUS
  Filled 2019-09-08: qty 42.08

## 2019-09-08 MED ORDER — PALONOSETRON HCL INJECTION 0.25 MG/5ML
0.2500 mg | Freq: Once | INTRAVENOUS | Status: AC
  Administered 2019-09-08: 0.25 mg via INTRAVENOUS

## 2019-09-08 MED ORDER — ANTICOAGULANT SODIUM CITRATE 4% (200MG/5ML) IV SOLN
5.0000 mL | Freq: Once | Status: AC
  Administered 2019-09-08: 5 mL via INTRAVENOUS
  Filled 2019-09-08: qty 5

## 2019-09-08 MED ORDER — PALONOSETRON HCL INJECTION 0.25 MG/5ML
INTRAVENOUS | Status: AC
  Filled 2019-09-08: qty 5

## 2019-09-08 MED ORDER — SODIUM CHLORIDE 0.9% FLUSH
10.0000 mL | INTRAVENOUS | Status: DC | PRN
  Administered 2019-09-08: 10 mL
  Filled 2019-09-08: qty 10

## 2019-09-08 MED ORDER — SODIUM CHLORIDE 0.9 % IV SOLN
40.0000 mg/m2 | Freq: Once | INTRAVENOUS | Status: AC
  Administered 2019-09-08: 83 mg via INTRAVENOUS
  Filled 2019-09-08: qty 83

## 2019-09-08 MED ORDER — SODIUM CHLORIDE 0.9 % IV SOLN
Freq: Once | INTRAVENOUS | Status: AC
  Administered 2019-09-08: 09:00:00 via INTRAVENOUS
  Filled 2019-09-08: qty 250

## 2019-09-08 MED ORDER — SODIUM CHLORIDE 0.9 % IV SOLN
Freq: Once | INTRAVENOUS | Status: AC
  Administered 2019-09-08: 11:00:00 via INTRAVENOUS
  Filled 2019-09-08: qty 5

## 2019-09-08 NOTE — Patient Instructions (Signed)
[facility] Discharge Instructions for Patients Receiving Chemotherapy  Today you received the following chemotherapy agents Gemcitabine (GEMZAR) & Cisplatin (PLATINOL).  To help prevent nausea and vomiting after your treatment, we encourage you to take your nausea medication as prescribed.   If you develop nausea and vomiting that is not controlled by your nausea medication, call the clinic.   BELOW ARE SYMPTOMS THAT SHOULD BE REPORTED IMMEDIATELY:  *FEVER GREATER THAN 100.5 F  *CHILLS WITH OR WITHOUT FEVER  NAUSEA AND VOMITING THAT IS NOT CONTROLLED WITH YOUR NAUSEA MEDICATION  *UNUSUAL SHORTNESS OF BREATH  *UNUSUAL BRUISING OR BLEEDING  TENDERNESS IN MOUTH AND THROAT WITH OR WITHOUT PRESENCE OF ULCERS  *URINARY PROBLEMS  *BOWEL PROBLEMS  UNUSUAL RASH Items with * indicate a potential emergency and should be followed up as soon as possible.  Feel free to call the clinic should you have any questions or concerns. The clinic phone number is [phone].  Please show the [facility] at check-in to the Emergency Department and triage nurse.  Coronavirus (COVID-19) Are you at risk?  Are you at risk for the Coronavirus (COVID-19)?  To be considered HIGH RISK for Coronavirus (COVID-19), you have to meet the following criteria:  . Traveled to [place], [place], [place], [place] or [place]; or in the [place] to [place], [place], [place], or [place]; and have fever, cough, and shortness of breath within the last 2 weeks of travel OR . Been in close contact with a person diagnosed with COVID-19 within the last 2 weeks and have fever, cough, and shortness of breath . IF YOU DO NOT MEET THESE CRITERIA, YOU ARE CONSIDERED LOW RISK FOR COVID-19.  What to do if you are HIGH RISK for COVID-19?  Marland Kitchen If you are having a medical emergency, call 911. . Seek medical care right away. Before you go to a doctor's office, urgent care or emergency department, call  ahead and tell them about your recent travel, contact with someone diagnosed with COVID-19, and your symptoms. You should receive instructions from your physician's office regarding next steps of care.  . When you arrive at healthcare provider, tell the healthcare staff immediately you have returned from visiting [place], [place], [place], [place] or [place]; or traveled in the [place] to [place], [place], [place], or [place]; in the last two weeks or you have been in close contact with a person diagnosed with COVID-19 in the last 2 weeks.   . Tell the health care staff about your symptoms: fever, cough and shortness of breath. . After you have been seen by a medical provider, you will be either: o Tested for (COVID-19) and discharged home on quarantine except to seek medical care if symptoms worsen, and asked to  - Stay home and avoid contact with others until you get your results (4-5 days)  - Avoid travel on public transportation if possible (such as bus, train, or airplane) or o Sent to the Emergency Department by EMS for evaluation, COVID-19 testing, and possible admission depending on your condition and test results.  What to do if you are LOW RISK for COVID-19?  Reduce your risk of any infection by using the same precautions used for avoiding the common cold or flu:  Marland Kitchen Wash your hands often with soap and warm water for at least 20 seconds.  If soap and water are not readily available, use an alcohol-based hand sanitizer with at least 60% alcohol.  Marland Kitchen  If coughing or sneezing, cover your mouth and nose by coughing or sneezing into the elbow areas of your shirt or coat, into a tissue or into your sleeve (not your hands). . Avoid shaking hands with others and consider head nods or verbal greetings only. . Avoid touching your eyes, nose, or mouth with unwashed hands.  . Avoid close contact with people who are sick. . Avoid places or events with large numbers of people in one location,  like concerts or sporting events. . Carefully consider travel plans you have or are making. . If you are planning any travel outside or inside the [place], visit the CDC's Travelers' Health webpage for the latest health notices. . If you have some symptoms but not all symptoms, continue to monitor at home and seek medical attention if your symptoms worsen. . If you are having a medical emergency, call 911.   [facility] / e-Visit: [email or web address]         [facility]: [facility]: [phone]                   [facility]: [phone]

## 2019-09-09 ENCOUNTER — Inpatient Hospital Stay: Payer: Medicaid Other

## 2019-09-09 VITALS — BP 132/73 | HR 77 | Temp 98.1°F | Resp 16

## 2019-09-09 DIAGNOSIS — Z5111 Encounter for antineoplastic chemotherapy: Secondary | ICD-10-CM | POA: Diagnosis not present

## 2019-09-09 DIAGNOSIS — C7800 Secondary malignant neoplasm of unspecified lung: Secondary | ICD-10-CM

## 2019-09-09 DIAGNOSIS — C760 Malignant neoplasm of head, face and neck: Secondary | ICD-10-CM

## 2019-09-09 DIAGNOSIS — Z95828 Presence of other vascular implants and grafts: Secondary | ICD-10-CM

## 2019-09-09 MED ORDER — ONDANSETRON HCL 4 MG/2ML IJ SOLN
8.0000 mg | Freq: Once | INTRAMUSCULAR | Status: AC
  Administered 2019-09-09: 8 mg via INTRAVENOUS

## 2019-09-09 MED ORDER — SODIUM CHLORIDE 0.9 % IV SOLN
Freq: Once | INTRAVENOUS | Status: AC
  Administered 2019-09-09: 09:00:00 via INTRAVENOUS
  Filled 2019-09-09: qty 250

## 2019-09-09 MED ORDER — ONDANSETRON HCL 4 MG/2ML IJ SOLN
INTRAMUSCULAR | Status: AC
  Filled 2019-09-09: qty 4

## 2019-09-09 MED ORDER — SODIUM CHLORIDE 0.9% FLUSH
10.0000 mL | Freq: Once | INTRAVENOUS | Status: AC | PRN
  Administered 2019-09-09: 10 mL
  Filled 2019-09-09: qty 10

## 2019-09-09 NOTE — Progress Notes (Signed)
Patient reported severe nausea since last night. During port access patient started actively vomiting. Port access paused and patient provided emesis bag and comfort. Zofran was released from supportive therapy. Port access re-started. IVF initiated, Zofran administered. Patient reports some resolution of nausea upon recheck 15 minutes later.

## 2019-09-11 ENCOUNTER — Other Ambulatory Visit: Payer: Self-pay | Admitting: Hematology and Oncology

## 2019-09-11 ENCOUNTER — Telehealth: Payer: Self-pay

## 2019-09-11 MED ORDER — OXYCODONE HCL 10 MG PO TABS: 10.0000 mg | ORAL_TABLET | Freq: Three times a day (TID) | ORAL | 0 refills | Status: DC | PRN

## 2019-09-11 NOTE — Telephone Encounter (Signed)
He called and left a message. He needs a refill on Oxycodone Rx.

## 2019-09-11 NOTE — Telephone Encounter (Signed)
done

## 2019-09-15 ENCOUNTER — Other Ambulatory Visit: Payer: Self-pay

## 2019-09-15 ENCOUNTER — Inpatient Hospital Stay (HOSPITAL_BASED_OUTPATIENT_CLINIC_OR_DEPARTMENT_OTHER): Payer: Medicaid Other | Admitting: Hematology and Oncology

## 2019-09-15 ENCOUNTER — Other Ambulatory Visit: Payer: Self-pay | Admitting: *Deleted

## 2019-09-15 ENCOUNTER — Inpatient Hospital Stay: Payer: Medicaid Other

## 2019-09-15 ENCOUNTER — Telehealth: Payer: Self-pay | Admitting: Hematology and Oncology

## 2019-09-15 ENCOUNTER — Encounter: Payer: Self-pay | Admitting: Hematology and Oncology

## 2019-09-15 VITALS — BP 135/89 | HR 97 | Temp 98.0°F | Resp 18 | Ht 69.0 in | Wt 193.1 lb

## 2019-09-15 DIAGNOSIS — C7800 Secondary malignant neoplasm of unspecified lung: Secondary | ICD-10-CM

## 2019-09-15 DIAGNOSIS — G893 Neoplasm related pain (acute) (chronic): Secondary | ICD-10-CM

## 2019-09-15 DIAGNOSIS — R112 Nausea with vomiting, unspecified: Secondary | ICD-10-CM

## 2019-09-15 DIAGNOSIS — Z95828 Presence of other vascular implants and grafts: Secondary | ICD-10-CM

## 2019-09-15 DIAGNOSIS — K5909 Other constipation: Secondary | ICD-10-CM

## 2019-09-15 DIAGNOSIS — C760 Malignant neoplasm of head, face and neck: Secondary | ICD-10-CM

## 2019-09-15 DIAGNOSIS — E039 Hypothyroidism, unspecified: Secondary | ICD-10-CM | POA: Diagnosis not present

## 2019-09-15 DIAGNOSIS — Z7189 Other specified counseling: Secondary | ICD-10-CM

## 2019-09-15 DIAGNOSIS — Z5111 Encounter for antineoplastic chemotherapy: Secondary | ICD-10-CM | POA: Diagnosis not present

## 2019-09-15 DIAGNOSIS — C119 Malignant neoplasm of nasopharynx, unspecified: Secondary | ICD-10-CM

## 2019-09-15 LAB — CBC WITH DIFFERENTIAL/PLATELET
Abs Immature Granulocytes: 0.18 10*3/uL — ABNORMAL HIGH (ref 0.00–0.07)
Basophils Absolute: 0.1 10*3/uL (ref 0.0–0.1)
Basophils Relative: 1 %
Eosinophils Absolute: 0.1 10*3/uL (ref 0.0–0.5)
Eosinophils Relative: 2 %
HCT: 38 % — ABNORMAL LOW (ref 39.0–52.0)
Hemoglobin: 12.7 g/dL — ABNORMAL LOW (ref 13.0–17.0)
Immature Granulocytes: 4 %
Lymphocytes Relative: 35 %
Lymphs Abs: 1.8 10*3/uL (ref 0.7–4.0)
MCH: 29.2 pg (ref 26.0–34.0)
MCHC: 33.4 g/dL (ref 30.0–36.0)
MCV: 87.4 fL (ref 80.0–100.0)
Monocytes Absolute: 0.5 10*3/uL (ref 0.1–1.0)
Monocytes Relative: 10 %
Neutro Abs: 2.5 10*3/uL (ref 1.7–7.7)
Neutrophils Relative %: 48 %
Platelets: 174 10*3/uL (ref 150–400)
RBC: 4.35 MIL/uL (ref 4.22–5.81)
RDW: 15.2 % (ref 11.5–15.5)
WBC: 5.2 10*3/uL (ref 4.0–10.5)
nRBC: 0 % (ref 0.0–0.2)

## 2019-09-15 LAB — COMPREHENSIVE METABOLIC PANEL
ALT: 27 U/L (ref 0–44)
AST: 20 U/L (ref 15–41)
Albumin: 4.3 g/dL (ref 3.5–5.0)
Alkaline Phosphatase: 64 U/L (ref 38–126)
Anion gap: 11 (ref 5–15)
BUN: 19 mg/dL (ref 6–20)
CO2: 29 mmol/L (ref 22–32)
Calcium: 9.2 mg/dL (ref 8.9–10.3)
Chloride: 101 mmol/L (ref 98–111)
Creatinine, Ser: 1.02 mg/dL (ref 0.61–1.24)
GFR calc Af Amer: >60 mL/min (ref >60)
GFR calc non Af Amer: >60 mL/min (ref >60)
Glucose, Bld: 122 mg/dL — ABNORMAL HIGH (ref 70–99)
Potassium: 3.6 mmol/L (ref 3.5–5.1)
Sodium: 141 mmol/L (ref 135–145)
Total Bilirubin: 0.3 mg/dL (ref 0.3–1.2)
Total Protein: 7.1 g/dL (ref 6.5–8.1)

## 2019-09-15 LAB — MAGNESIUM: Magnesium: 1.8 mg/dL (ref 1.7–2.4)

## 2019-09-15 MED ORDER — ANTICOAGULANT SODIUM CITRATE 4% (200MG/5ML) IV SOLN
5.0000 mL | Freq: Once | Status: AC
  Administered 2019-09-15: 5 mL
  Filled 2019-09-15: qty 5

## 2019-09-15 MED ORDER — ANTICOAGULANT SODIUM CITRATE 4% (200MG/5ML) IV SOLN
5.0000 mL | Freq: Once | Status: DC
  Filled 2019-09-15: qty 5

## 2019-09-15 MED ORDER — SODIUM CHLORIDE 0.9 % IV SOLN
Freq: Once | INTRAVENOUS | Status: AC
  Administered 2019-09-15: 11:00:00 via INTRAVENOUS
  Filled 2019-09-15: qty 4

## 2019-09-15 MED ORDER — SODIUM CHLORIDE 0.9 % IV SOLN
Freq: Once | INTRAVENOUS | Status: AC
  Administered 2019-09-15: 11:00:00 via INTRAVENOUS
  Filled 2019-09-15: qty 250

## 2019-09-15 MED ORDER — SODIUM CHLORIDE 0.9 % IV SOLN
800.0000 mg/m2 | Freq: Once | INTRAVENOUS | Status: AC
  Administered 2019-09-15: 1672 mg via INTRAVENOUS
  Filled 2019-09-15: qty 43.97

## 2019-09-15 MED ORDER — SODIUM CHLORIDE 0.9% FLUSH
10.0000 mL | INTRAVENOUS | Status: DC | PRN
  Administered 2019-09-15: 10 mL
  Filled 2019-09-15: qty 10

## 2019-09-15 MED ORDER — SODIUM CHLORIDE 0.9% FLUSH
10.0000 mL | Freq: Once | INTRAVENOUS | Status: AC
  Administered 2019-09-15: 10 mL
  Filled 2019-09-15: qty 10

## 2019-09-15 NOTE — Assessment & Plan Note (Signed)
Due to high risks of metastatic disease, I plan to repeat CT imaging for objective assessment of response to therapy

## 2019-09-15 NOTE — Patient Instructions (Signed)
Gemcitabine injection What is this medicine? GEMCITABINE (jem SYE ta been) is a chemotherapy drug. This medicine is used to treat many types of cancer like breast cancer, lung cancer, pancreatic cancer, and ovarian cancer. This medicine may be used for other purposes; ask your health care provider or pharmacist if you have questions. COMMON BRAND NAME(S): Gemzar, Infugem What should I tell my health care provider before I take this medicine? They need to know if you have any of these conditions:  blood disorders  infection  kidney disease  liver disease  lung or breathing disease, like asthma  recent or ongoing radiation therapy  an unusual or allergic reaction to gemcitabine, other chemotherapy, other medicines, foods, dyes, or preservatives  pregnant or trying to get pregnant  breast-feeding How should I use this medicine? This drug is given as an infusion into a vein. It is administered in a hospital or clinic by a specially trained health care professional. Talk to your pediatrician regarding the use of this medicine in children. Special care may be needed. Overdosage: If you think you have taken too much of this medicine contact a poison control center or emergency room at once. NOTE: This medicine is only for you. Do not share this medicine with others. What if I miss a dose? It is important not to miss your dose. Call your doctor or health care professional if you are unable to keep an appointment. What may interact with this medicine?  medicines to increase blood counts like filgrastim, pegfilgrastim, sargramostim  some other chemotherapy drugs like cisplatin  vaccines Talk to your doctor or health care professional before taking any of these medicines:  acetaminophen  aspirin  ibuprofen  ketoprofen  naproxen This list may not describe all possible interactions. Give your health care provider a list of all the medicines, herbs, non-prescription drugs, or  dietary supplements you use. Also tell them if you smoke, drink alcohol, or use illegal drugs. Some items may interact with your medicine. What should I watch for while using this medicine? Visit your doctor for checks on your progress. This drug may make you feel generally unwell. This is not uncommon, as chemotherapy can affect healthy cells as well as cancer cells. Report any side effects. Continue your course of treatment even though you feel ill unless your doctor tells you to stop. In some cases, you may be given additional medicines to help with side effects. Follow all directions for their use. Call your doctor or health care professional for advice if you get a fever, chills or sore throat, or other symptoms of a cold or flu. Do not treat yourself. This drug decreases your body's ability to fight infections. Try to avoid being around people who are sick. This medicine may increase your risk to bruise or bleed. Call your doctor or health care professional if you notice any unusual bleeding. Be careful brushing and flossing your teeth or using a toothpick because you may get an infection or bleed more easily. If you have any dental work done, tell your dentist you are receiving this medicine. Avoid taking products that contain aspirin, acetaminophen, ibuprofen, naproxen, or ketoprofen unless instructed by your doctor. These medicines may hide a fever. Do not become pregnant while taking this medicine or for 6 months after stopping it. Women should inform their doctor if they wish to become pregnant or think they might be pregnant. Men should not father a child while taking this medicine and for 3 months after stopping it.  There is a potential for serious side effects to an unborn child. Talk to your health care professional or pharmacist for more information. Do not breast-feed an infant while taking this medicine or for at least 1 week after stopping it. Men should inform their doctors if they wish  to father a child. This medicine may lower sperm counts. Talk with your doctor or health care professional if you are concerned about your fertility. What side effects may I notice from receiving this medicine? Side effects that you should report to your doctor or health care professional as soon as possible:  allergic reactions like skin rash, itching or hives, swelling of the face, lips, or tongue  breathing problems  pain, redness, or irritation at site where injected  signs and symptoms of a dangerous change in heartbeat or heart rhythm like chest pain; dizziness; fast or irregular heartbeat; palpitations; feeling faint or lightheaded, falls; breathing problems  signs of decreased platelets or bleeding - bruising, pinpoint red spots on the skin, black, tarry stools, blood in the urine  signs of decreased red blood cells - unusually weak or tired, feeling faint or lightheaded, falls  signs of infection - fever or chills, cough, sore throat, pain or difficulty passing urine  signs and symptoms of kidney injury like trouble passing urine or change in the amount of urine  signs and symptoms of liver injury like dark yellow or brown urine; general ill feeling or flu-like symptoms; light-colored stools; loss of appetite; nausea; right upper belly pain; unusually weak or tired; yellowing of the eyes or skin  swelling of ankles, feet, hands Side effects that usually do not require medical attention (report to your doctor or health care professional if they continue or are bothersome):  constipation  diarrhea  hair loss  loss of appetite  nausea  rash  vomiting This list may not describe all possible side effects. Call your doctor for medical advice about side effects. You may report side effects to FDA at 1-800-FDA-1088. Where should I keep my medicine? This drug is given in a hospital or clinic and will not be stored at home. NOTE: This sheet is a summary. It may not cover all  possible information. If you have questions about this medicine, talk to your doctor, pharmacist, or health care provider.  2020 [employer] (2017-12-15 18:06:11)

## 2019-09-15 NOTE — Patient Instructions (Signed)

## 2019-09-15 NOTE — Assessment & Plan Note (Signed)
He will continue taking antiemetics as needed

## 2019-09-15 NOTE — Assessment & Plan Note (Signed)
We discussed the importance of aggressive laxative therapy

## 2019-09-15 NOTE — Assessment & Plan Note (Signed)
He tolerated recent chemotherapy without major side effects His examination is unchanged I plan to repeat imaging study at the end of the month for further objective assessment of response to therapy

## 2019-09-15 NOTE — Assessment & Plan Note (Signed)
He will continue oxycodone taper

## 2019-09-15 NOTE — Assessment & Plan Note (Signed)
TSH is pending I will continue to adjust his thyroid medicine as needed

## 2019-09-15 NOTE — Telephone Encounter (Signed)
Scheduled appt per 12/11 sch message - pt is aware of appt

## 2019-09-15 NOTE — Progress Notes (Signed)
[facility] OFFICE PROGRESS NOTE  Patient Care Team: Patient, No Pcp Per as PCP - General (General Practice) [name], MD as Consulting Physician (Hematology and Oncology) Eppie Gibson, MD as Attending Physician (Radiation Oncology) Jodi Marble, MD as Consulting Physician (Otolaryngology) Philomena Doheny, MD as Referring Physician (Plastic Surgery) Irene Shipper, MD as Consulting Physician (Gastroenterology) System, Provider Not In  ASSESSMENT & PLAN:  Primary cancer of head and neck [facility]) He tolerated recent chemotherapy without major side effects His examination is unchanged I plan to repeat imaging study at the end of the month for further objective assessment of response to therapy  Metastasis to lung Due to high risks of metastatic disease, I plan to repeat CT imaging for objective assessment of response to therapy  Cancer associated pain He will continue oxycodone taper  Acquired hypothyroidism TSH is pending I will continue to adjust his thyroid medicine as needed  Nausea and vomiting He will continue taking antiemetics as needed  Other constipation We discussed the importance of aggressive laxative therapy   Orders Placed This Encounter  Procedures  . CT Chest W Contrast    Standing Status:   Future    Standing Expiration Date:   09/14/2020    Order Specific Question:   If indicated for the ordered procedure, I authorize the administration of contrast media per Radiology protocol    Answer:   Yes    Order Specific Question:   Preferred imaging location?    Answer:   [facility]    Order Specific Question:   Radiology Contrast Protocol - do NOT remove file path    Answer:   \\[email or web address]  . CT Soft Tissue Neck W Contrast    Standing Status:   Future    Standing Expiration Date:   09/14/2020    Order Specific Question:   If indicated for the ordered procedure, I authorize the administration of contrast  media per Radiology protocol    Answer:   Yes    Order Specific Question:   Preferred imaging location?    Answer:   [facility]    Order Specific Question:   Radiology Contrast Protocol - do NOT remove file path    Answer:   \\[email or web address]  . Comprehensive metabolic panel    Standing Status:   Standing    Number of Occurrences:   22    Standing Expiration Date:   09/14/2020  . CBC with Differential    Standing Status:   Standing    Number of Occurrences:   22    Standing Expiration Date:   09/14/2020    INTERVAL HISTORY: Please see below for problem oriented charting. 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 Pan-endoscopy elsewhere showed cancer from nasopharyngeal space.   01/04/2007 - 02/20/2007 Chemotherapy   He received 2 cycles of cisplatin and 5FU followed by concurrent chemo with weekly cisplatin and radiation. He only received 2 doses of chemo due to severe mucositis, nausea and weight loss.   04/05/2007 - 08/04/2007 Chemotherapy   He received 4 more courses of cisplatin with 5FU and had  complete response   07/05/2009 Procedure   Fine-needle aspirate of the right level II lymph nodes come from recurrent metastatic disease. Repeat endoscopy and CT scan show no evidence of disease elsewhere.   07/08/2009 - 12/02/2009 Chemotherapy   He was given 6 cycles of carboplatin, 5-FU and docetaxel   12/03/2009 Surgery   He has surgery to the residual lymph node on the right neck which showed no evidence of disease.   02/22/2012 Imaging   Repeat imaging study showed large recurrent mass. He was referred elsewhere for further treatment.   05/03/2012 Surgery   He underwent left upper lobectomy.   04/29/2013 Imaging   PEt scan showed lesion on right level II B and lower lung was abnormal   06/03/2013 - 02/02/2014 Chemotherapy   He had 6 cycles of chemotherapy when he was found to have recurrence of cancer and had received oxaliplatin and capecitabine   06/07/2014 Imaging   PET CT scan showed persistent disease in the right neck lymph nodes and left lung   06/29/2014 Procedure   Accession: [id number] repeat LUL biopsy confirmed metastatic cancer   07/18/2014 - 07/31/2014 Radiation Therapy   He received palliative radiation therapy to the lungs   10/10/2014 Imaging   CT scan of the chest, abdomen and pelvis show regression in the size of the lung nodule in the left upper lobe and stable pulmonary nodules   01/24/2015 Imaging   CT scan showed stable disease in neck and lung   06/19/2015 Imaging   CT scan of the neck and the chest show possible mild progression of the nodule in the right side of the neck.   06/25/2015 Imaging   PET scan confirmed disease recurrence in the neck   07/07/2015 Imaging   [facility]   09/03/2015 - 08/26/2018 Chemotherapy   He received palliative chemo with Nivolumab   10/29/2015 Imaging   PET CT showed positive response to Rx   02/28/2016 Imaging   Ct abdomen showed abnormal thinkening in his stomach   03/03/2016 Imaging   CT: Right  sternocleidomastoid muscle metastasis appears less distinct but otherwise not significantly changed in size or configuration since 06/19/2015.2. Left level 3 lymph node which was hypermetabolic by PET-CT in January 2017 appears slightly smaller   04/01/2016 Imaging   CT cervical spine showed no acute fracture or traumatic malalignment in the cervical spine   04/22/2016 Procedure   Port-a-cath placed.   06/16/2016 Imaging   Ct neck showed right sternocleidomastoid muscle metastasis is further decreased in conspicuity since May, and has mildly decreased in size since September 2016. Continued stability of sub-centimeter left cervical lymph nodes. No new or progressive metastatic disease in the neck.   06/16/2016 Imaging   CT chest showed stable masslike radiation fibrosis in the left upper lobe. Stable subcentimeter pulmonary nodules in the bilateral lower lobes. No new or progressive metastatic disease in the chest. Nonobstructing left renal stone.   10/13/2016 Imaging   Ct neck showed  unchanged right sternocleidomastoid muscle metastasis. Unchanged subcentimeter left cervical lymph nodes. No evidence of new or progressive metastatic disease in the neck.   10/13/2016 Imaging   CT chest showed tiny hypervascular foci in the liver, not definitely seen on prior imaging of 06/16/2016 and 02/28/2016. Abdomen MRI without and with contrast recommended to further evaluate as metastatic disease is a concern. 2. Stable appearance of post treatment changes left upper lung and scattered tiny bilateral pulmonary nodules.   02/11/2017 Imaging   Ct neck: Lymph node mass right posterior neck appears improved from the prior study. Small posterior lymph nodes on the left unchanged. Occluded right jugular vein unchanged.   02/11/2017 Imaging   1. Similar appearance of postsurgical and radiation changes in the left upper lobe. 2. Similar bilateral pulmonary nodules. 3. No thoracic adenopathy. 4. Subtle foci of  post-contrast enhancement within the liver are suboptimally characterized on this nondedicated study. Likely similar. These could either be re-evaluated at followup or more entirely characterized with abdominal MRI. 5. Left nephrolithiasis.   05/19/2017 Imaging   Matted lymph node mass right posterior neck appears larger in the recent CT. Accurate measurements difficult due to infiltrating tumor margins and infiltration of the muscle. Right jugular vein again appears occluded or resected. Small left posterior lymph nodes stable. Left upper lobe airspace density stable and similar to the prior CT   06/03/2017 PET scan   1. Hypermetabolic ill-defined right level IIb lymph node, about 1.3 cm in diameter with maximum SUV 9.5 (formerly 8.1). Appearance suspicious for residual/recurrent malignancy. No worrisome left-sided lesion. 2. Left suprahilar indistinct opacity demonstrates no worrisome hypermetabolic activity. The 5 mm left lower lobe pulmonary nodule is stable and not currently hypermetabolic although below sensitive PET-CT size thresholds. 3. Other imaging findings of potential clinical significance: Bilateral nonobstructive nephrolithiasis. Chronic bilateral maxillary sinusitis.   05/11/2018 PET scan   1. Continued chronic accentuated metabolic activity in the vicinity of right level IIB and the adjacent right sternocleidomastoid muscle, with ill definition of surrounding tissue planes. Maximum SUV is currently 8.1, formerly 9.5. Accentuated metabolic activity is been present in this vicinity back through 06/25/2015, and there was also some low-level activity in this vicinity on 06/07/2014. Some of this may be from scarring and local muscular activity although clearly a component of residual tumor is difficult to exclude given the focally high activity. 2. Other imaging findings of potential clinical significance: Chronic bilateral maxillary sinusitis. Chronic scarring in the left upper lobe.  Chronically stable 5 mm left lower lobe nodule is considered benign. Nonobstructive left nephrolithiasis.   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 COMMENT:No significant keratinization is identified. Other basaloid carcinomas are in the differential diagnosis. No cell block material is available for further testing.   09/12/2018 Procedure   He underwent fine Needle Aspiration   10/04/2018 PET scan   1. Significant progression of local recurrence laterally in the mid right neck with an enlarging, increasingly hypermetabolic soft tissue mass. This involves the right sternocleidomastoid muscle. 2. Small lymph nodes in the right axilla are increasingly hypermetabolic. These are nonspecific and potentially reactive, although could reflect a small metastases. Small hypermetabolic nodule in the left suprasternal notch is unchanged. 3. No other evidence of metastatic disease.    10/07/2018 - 12/23/2018 Chemotherapy   The patient had cisplatin plus gemzar   12/07/2018 Imaging   1. Decreased size of lateral right  neck mass. 2. Unchanged soft tissue nodule in the suprasternal notch. 3. No evidence of new metastatic disease in the neck.    05/30/2019 Imaging   CT neck No clear change or progression compared to the study of March. Overall measurements of the right lateral neck mass are similar, approximately 3 x 1.8 cm. See above discussion. One could argue that there is slight increase in lateral bulging, possibly with an increase in contrast enhancement, towards the inferior margin. This is of questionable validity but could possibly represent some progression or inflammatory change. Other findings in the region are stable.   07/20/2019 -  Chemotherapy   The patient had palonosetron (ALOXI) injection 0.25 mg, 0.25 mg, Intravenous,  Once, 3 of 4  cycles Administration: 0.25 mg (07/20/2019), 0.25 mg (08/10/2019), 0.25 mg (09/08/2019) CISplatin (PLATINOL) 84 mg in sodium chloride 0.9 % 250 mL chemo infusion, 40 mg/m2 = 84 mg (80 % of original dose 50 mg/m2), Intravenous,  Once, 3 of 4 cycles Dose modification: 40 mg/m2 (80 % of original dose 50 mg/m2, Cycle 1, Reason: Dose Not Tolerated) Administration: 84 mg (07/20/2019), 84 mg (08/10/2019), 83 mg (09/08/2019) gemcitabine (GEMZAR) 1,600 mg in sodium chloride 0.9 % 250 mL chemo infusion, 1,672 mg (80 % of original dose 1,000 mg/m2), Intravenous,  Once, 3 of 4 cycles Dose modification: 800 mg/m2 (80 % of original dose 1,000 mg/m2, Cycle 1, Reason: Provider Judgment) Administration: 1,600 mg (07/20/2019), 1,600 mg (07/27/2019), 1,600 mg (08/10/2019), 1,600 mg (08/17/2019), 1,600 mg (09/08/2019) ondansetron (ZOFRAN) 8 mg, dexamethasone (DECADRON) 10 mg in sodium chloride 0.9 % 50 mL IVPB, , Intravenous,  Once, 3 of 4 cycles fosaprepitant (EMEND) 150 mg, dexamethasone (DECADRON) 12 mg in sodium chloride 0.9 % 145 mL IVPB, , Intravenous,  Once, 3 of 4 cycles Administration:  (07/20/2019),  (08/10/2019),  (09/08/2019)  for chemotherapy treatment.      REVIEW OF SYSTEMS:   Constitutional: Denies fevers, chills or abnormal weight loss Eyes: Denies blurriness of vision Ears, nose, mouth, throat, and face: Denies mucositis or sore throat Respiratory: Denies cough, dyspnea or wheezes Cardiovascular: Denies palpitation, chest discomfort or lower extremity swelling Skin: Denies abnormal skin rashes Lymphatics: Denies new lymphadenopathy or easy bruising Neurological:Denies numbness, tingling or new weaknesses Behavioral/Psych: Mood is stable, no new changes  All other systems were reviewed with the patient and are negative.  I have reviewed the past medical history, past surgical history, social history and family history with the patient and they are unchanged from previous note.  ALLERGIES:  is  allergic to phenergan [promethazine hcl]; heparin; and clindamycin.  MEDICATIONS:  Current Outpatient Medications  Medication Sig Dispense Refill  . atorvastatin (LIPITOR) 40 MG tablet TK 1 T PO  D    . cetirizine (ZYRTEC) 10 MG tablet Take 10 mg by mouth daily.    Marland Kitchen ibuprofen (ADVIL) 600 MG tablet TK 1 T PO Q 6 H PRN    . levothyroxine (SYNTHROID, LEVOTHROID) 137 MCG tablet TAKE 1 TABLET(137 MCG) BY MOUTH DAILY BEFORE BREAKFAST (Patient taking differently: Take 137 mcg by mouth daily before breakfast. ) 90 tablet 0  . lidocaine (XYLOCAINE) 2 % solution Use as directed 5 mLs in the mouth or throat every 3 (three) hours as needed for mouth pain. Swish, gargle and spit 100 mL 2  . LORazepam (ATIVAN) 1 MG tablet Take 1 tablet (1 mg total) by mouth every 8 (eight) hours as needed for anxiety. 30 tablet 3  . omeprazole (PRILOSEC) 20 MG capsule Take 1  capsule (20 mg total) by mouth daily. 30 capsule 5  . ondansetron (ZOFRAN) 8 MG tablet Take 1 tablet (8 mg total) by mouth every 8 (eight) hours as needed for nausea or vomiting. 90 tablet 1  . Oxycodone HCl 10 MG TABS Take 1 tablet (10 mg total) by mouth every 8 (eight) hours as needed. 60 tablet 0  . PARoxetine (PAXIL) 20 MG tablet Take 1 tablet (20 mg total) by mouth daily. 30 tablet 5  . polyethylene glycol (MIRALAX) packet Take 17 g by mouth daily. (Patient taking differently: Take 17 g by mouth daily as needed for mild constipation or moderate constipation. ) 14 each 3  . prochlorperazine (COMPAZINE) 10 MG tablet Take 1 tablet (10 mg total) by mouth every 12 (twelve) hours as needed (Nausea or vomiting). 30 tablet 1  . senna-docusate (SENOKOT-S) 8.6-50 MG tablet Take 2 tablets by mouth 3 (three) times daily. 90 tablet 0  . Vitamin D, Ergocalciferol, (DRISDOL) 1.25 MG (50000 UT) CAPS capsule TK 1 C PO WEEKLY     No current facility-administered medications for this visit.   Facility-Administered Medications Ordered in Other Visits  Medication Dose  Route Frequency Provider Last Rate Last Admin  . anticoagulant sodium citrate solution 5 mL  5 mL Intracatheter Once [name], MD        PHYSICAL EXAMINATION: ECOG PERFORMANCE STATUS: 1 - Symptomatic but completely ambulatory  Vitals:   09/15/19 1015  BP: 135/89  Pulse: 97  Resp: 18  Temp: 98 F (36.7 C)  SpO2: 100%   Filed Weights   09/15/19 1015  Weight: 193 lb 1.6 oz (87.6 kg)    GENERAL:alert, no distress and comfortable SKIN: skin color, texture, turgor are normal, no rashes or significant lesions EYES: normal, Conjunctiva are pink and non-injected, sclera clear OROPHARYNX:no exudate, no erythema and lips, buccal mucosa, and tongue normal  NECK the neck mass appears about the same LYMPH:  no palpable lymphadenopathy in the cervical, axillary or inguinal LUNGS: clear to auscultation and percussion with normal breathing effort HEART: regular rate & rhythm and no murmurs and no lower extremity edema ABDOMEN:abdomen soft, non-tender and normal bowel sounds Musculoskeletal:no cyanosis of digits and no clubbing  NEURO: alert & oriented x 3 with fluent speech, no focal motor/sensory deficits  LABORATORY DATA:  I have reviewed the data as listed    Component Value Date/Time   NA 140 09/07/2019 1131   NA 139 09/22/2017 0829   K 3.8 09/07/2019 1131   K 3.5 09/22/2017 0829   CL 103 09/07/2019 1131   CO2 29 09/07/2019 1131   CO2 26 09/22/2017 0829   GLUCOSE 133 (H) 09/07/2019 1131   GLUCOSE 133 09/22/2017 0829   BUN 12 09/07/2019 1131   BUN 14.1 09/22/2017 0829   CREATININE 1.09 09/07/2019 1131   CREATININE 0.93 02/06/2019 1215   CREATININE 0.9 09/22/2017 0829   CALCIUM 9.2 09/07/2019 1131   CALCIUM 9.1 09/22/2017 0829   PROT 7.1 09/07/2019 1131   PROT 6.8 09/22/2017 0829   ALBUMIN 4.3 09/07/2019 1131   ALBUMIN 4.1 09/22/2017 0829   AST 26 09/07/2019 1131   AST 31 02/06/2019 1215   AST 22 09/22/2017 0829   ALT 35 09/07/2019 1131   ALT 48 (H) 02/06/2019 1215    ALT 30 09/22/2017 0829   ALKPHOS 65 09/07/2019 1131   ALKPHOS 55 09/22/2017 0829   BILITOT 0.3 09/07/2019 1131   BILITOT 0.2 (L) 02/06/2019 1215   BILITOT 0.35 09/22/2017 0829  GFRNONAA >60 09/07/2019 1131   GFRNONAA >60 02/06/2019 1215   GFRAA >60 09/07/2019 1131   GFRAA >60 02/06/2019 1215    No results found for: SPEP, UPEP  Lab Results  Component Value Date   WBC 5.2 09/15/2019   NEUTROABS 2.5 09/15/2019   HGB 12.7 (L) 09/15/2019   HCT 38.0 (L) 09/15/2019   MCV 87.4 09/15/2019   PLT 174 09/15/2019      Chemistry      Component Value Date/Time   NA 140 09/07/2019 1131   NA 139 09/22/2017 0829   K 3.8 09/07/2019 1131   K 3.5 09/22/2017 0829   CL 103 09/07/2019 1131   CO2 29 09/07/2019 1131   CO2 26 09/22/2017 0829   BUN 12 09/07/2019 1131   BUN 14.1 09/22/2017 0829   CREATININE 1.09 09/07/2019 1131   CREATININE 0.93 02/06/2019 1215   CREATININE 0.9 09/22/2017 0829      Component Value Date/Time   CALCIUM 9.2 09/07/2019 1131   CALCIUM 9.1 09/22/2017 0829   ALKPHOS 65 09/07/2019 1131   ALKPHOS 55 09/22/2017 0829   AST 26 09/07/2019 1131   AST 31 02/06/2019 1215   AST 22 09/22/2017 0829   ALT 35 09/07/2019 1131   ALT 48 (H) 02/06/2019 1215   ALT 30 09/22/2017 0829   BILITOT 0.3 09/07/2019 1131   BILITOT 0.2 (L) 02/06/2019 1215   BILITOT 0.35 09/22/2017 0829      All questions were answered. The patient knows to call the clinic with any problems, questions or concerns. No barriers to learning was detected.  I spent 25 minutes counseling the patient face to face. The total time spent in the appointment was 30 minutes and more than 50% was on counseling and review of test results  [name], MD 09/15/2019 10:34 AM

## 2019-09-16 ENCOUNTER — Inpatient Hospital Stay: Payer: Medicaid Other

## 2019-10-02 ENCOUNTER — Other Ambulatory Visit: Payer: Self-pay

## 2019-10-02 ENCOUNTER — Encounter (HOSPITAL_COMMUNITY): Payer: Self-pay

## 2019-10-02 ENCOUNTER — Ambulatory Visit (HOSPITAL_COMMUNITY)
Admission: RE | Admit: 2019-10-02 | Discharge: 2019-10-02 | Disposition: A | Discharge status: Home / Self Care | Payer: Medicaid Other | Source: Ambulatory Visit | Attending: Hematology and Oncology | Admitting: Hematology and Oncology

## 2019-10-02 DIAGNOSIS — C760 Malignant neoplasm of head, face and neck: Secondary | ICD-10-CM | POA: Insufficient documentation

## 2019-10-02 DIAGNOSIS — C7800 Secondary malignant neoplasm of unspecified lung: Secondary | ICD-10-CM | POA: Insufficient documentation

## 2019-10-02 MED ORDER — IOHEXOL 300 MG/ML  SOLN
75.0000 mL | Freq: Once | INTRAMUSCULAR | Status: AC | PRN
  Administered 2019-10-02: 09:00:00 75 mL via INTRAVENOUS

## 2019-10-02 MED ORDER — SODIUM CHLORIDE (PF) 0.9 % IJ SOLN
INTRAMUSCULAR | Status: AC
  Filled 2019-10-02: qty 50

## 2019-10-03 ENCOUNTER — Other Ambulatory Visit: Payer: Self-pay | Admitting: Hematology and Oncology

## 2019-10-03 ENCOUNTER — Telehealth: Payer: Self-pay

## 2019-10-03 ENCOUNTER — Inpatient Hospital Stay: Payer: Medicaid Other | Admitting: Hematology and Oncology

## 2019-10-03 MED ORDER — OXYCODONE HCL 10 MG PO TABS: 10.0000 mg | ORAL_TABLET | Freq: Three times a day (TID) | ORAL | 0 refills | Status: DC | PRN

## 2019-10-03 NOTE — Telephone Encounter (Signed)
I can see him tomorrow at 1045 or Thursday at 9 am Otherwise he would have to wait till next week Please send scheduling msg I willr efill his pain medication

## 2019-10-03 NOTE — Telephone Encounter (Signed)
He called and left a message. He has to cancel today's appt. His babysitter did not show up and his wife is at work. He apologized over and over. He is available in the am or Thursday for appt. He needs a refill on Oxycodone. He has one tab left.

## 2019-10-03 NOTE — Telephone Encounter (Signed)
Called and given below message. He verbalized understanding. Aptt scheduled for tomorrow.

## 2019-10-04 ENCOUNTER — Inpatient Hospital Stay (HOSPITAL_BASED_OUTPATIENT_CLINIC_OR_DEPARTMENT_OTHER): Payer: Medicaid Other | Admitting: Hematology and Oncology

## 2019-10-04 ENCOUNTER — Other Ambulatory Visit: Payer: Self-pay

## 2019-10-04 VITALS — BP 150/98 | HR 103 | Temp 97.8°F | Resp 18 | Ht 69.0 in | Wt 197.0 lb

## 2019-10-04 DIAGNOSIS — Z7189 Other specified counseling: Secondary | ICD-10-CM

## 2019-10-04 DIAGNOSIS — C760 Malignant neoplasm of head, face and neck: Secondary | ICD-10-CM

## 2019-10-04 DIAGNOSIS — K769 Liver disease, unspecified: Secondary | ICD-10-CM

## 2019-10-04 DIAGNOSIS — E039 Hypothyroidism, unspecified: Secondary | ICD-10-CM

## 2019-10-04 DIAGNOSIS — Z5111 Encounter for antineoplastic chemotherapy: Secondary | ICD-10-CM | POA: Diagnosis not present

## 2019-10-04 DIAGNOSIS — G893 Neoplasm related pain (acute) (chronic): Secondary | ICD-10-CM

## 2019-10-04 DIAGNOSIS — C7800 Secondary malignant neoplasm of unspecified lung: Secondary | ICD-10-CM | POA: Diagnosis not present

## 2019-10-05 ENCOUNTER — Encounter: Payer: Self-pay | Admitting: Hematology and Oncology

## 2019-10-05 DIAGNOSIS — K769 Liver disease, unspecified: Secondary | ICD-10-CM | POA: Insufficient documentation

## 2019-10-05 NOTE — Progress Notes (Signed)
DISCONTINUE OFF PATHWAY REGIMEN - Head and Neck   [id number]:Cisplatin 80 mg/m2 IV D1 + Gemcitabine 1,000 mg/m2 IV D1,8 q21 Days x 3 Cycles:   A cycle is every 21 days:     Gemcitabine      Cisplatin   **Always confirm dose/schedule in your pharmacy ordering system**  REASON: Other Reason PRIOR TREATMENT: Off Pathway: Cisplatin 80 mg/m2 IV D1 + Gemcitabine 1,000 mg/m2 IV D1,8 q21 Days x 3 Cycles TREATMENT RESPONSE: Stable Disease (SD)  START OFF PATHWAY REGIMEN - Head and Neck   [id number]:Gemcitabine 1,000 mg/m2 Days 1, 8, 15 q28 Days:   A cycle is every 28 days (3 weeks on and 1 week off):     Gemcitabine   **Always confirm dose/schedule in your pharmacy ordering system**  Patient Characteristics: Nasopharyngeal, Metastatic, Third Line & Beyond, TMB-L/Unknown Disease Classification: Nasopharyngeal Current Disease Status: Metastatic Disease AJCC T Category: [place] AJCC N Category: NX AJCC M Category: M1 AJCC 8 Stage Grouping: IVB Line of Therapy: Third Geophysical data processor Tumor Mutational Burden (TMB): TMB-L Intent of Therapy: Non-Curative / Palliative Intent, Discussed with Patient

## 2019-10-05 NOTE — Progress Notes (Signed)
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[place]) I have reviewed multiple imaging studies with the patient Overall, he has stable disease However, I am concerned about discontinuation of treatment given rapid relapse the last time Recommend maintaining treatment with gemcitabine He tolerated gemcitabine fairly well I recommend modifying treatment schedule to give him on days 1 and 15, rest day 8 and 22 for cycle of every 28 days With single agent gemcitabine, I anticipate he will tolerate that well and I will recommend full dose at 1000 mg/m We will repeat imaging study in about 3 months after starting on treatment He will call me when he is ready to schedule chemotherapy maintenance  Metastasis to lung CT scan of the chest shows stable changes He is not symptomatic Observe  Liver lesion There is a lesion seen on CT imaging He is not symptomatic Observe only.  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 TSH    Standing Status:   Standing    Number of Occurrences:   7    Standing Expiration Date:   10/04/2020    INTERVAL HISTORY: Please see below for problem oriented charting. 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 Pan-endoscopy elsewhere showed cancer from nasopharyngeal space.   01/04/2007 - 02/20/2007 Chemotherapy   He received 2 cycles of cisplatin and 5FU followed by concurrent chemo with weekly cisplatin and radiation. He only received 2 doses of chemo due to severe mucositis, nausea and weight loss.   04/05/2007 - 08/04/2007 Chemotherapy   He received 4 more courses of cisplatin with 5FU and had complete response   07/05/2009 Procedure   Fine-needle aspirate of the right level II lymph nodes come from recurrent metastatic disease. Repeat endoscopy and CT scan show no evidence of disease elsewhere.   07/08/2009 - 12/02/2009 Chemotherapy   He was given 6 cycles of carboplatin, 5-FU and docetaxel   12/03/2009 Surgery   He has surgery to the residual lymph node on the right neck which showed no evidence of disease.   02/22/2012 Imaging   Repeat imaging study showed large recurrent mass. He was referred elsewhere for further treatment.   05/03/2012 Surgery  He underwent left upper lobectomy.   04/29/2013 Imaging   PEt scan showed lesion on right level II B and lower lung was abnormal   06/03/2013 - 02/02/2014 Chemotherapy   He had 6 cycles of chemotherapy when he was found to have recurrence of cancer and had received oxaliplatin and capecitabine   06/07/2014 Imaging   PET CT scan showed persistent disease in the right  neck lymph nodes and left lung   06/29/2014 Procedure   Accession: [id number] repeat LUL biopsy confirmed metastatic cancer   07/18/2014 - 07/31/2014 Radiation Therapy   He received palliative radiation therapy to the lungs   10/10/2014 Imaging   CT scan of the chest, abdomen and pelvis show regression in the size of the lung nodule in the left upper lobe and stable pulmonary nodules   01/24/2015 Imaging   CT scan showed stable disease in neck and lung   06/19/2015 Imaging   CT scan of the neck and the chest show possible mild progression of the nodule in the right side of the neck.   06/25/2015 Imaging   PET scan confirmed disease recurrence in the neck   07/07/2015 Imaging   He had MRI neck at [facility]   09/03/2015 - 08/26/2018 Chemotherapy   He received palliative chemo with Nivolumab   10/29/2015 Imaging   PET CT showed positive response to Rx   02/28/2016 Imaging   Ct abdomen showed abnormal thinkening in his stomach   03/03/2016 Imaging   CT: Right sternocleidomastoid muscle metastasis appears less distinct but otherwise not significantly changed in size or configuration since 06/19/2015.2. Left level 3 lymph node which was hypermetabolic by PET-CT in January 2017 appears slightly smaller   04/01/2016 Imaging   CT cervical spine showed no acute fracture or traumatic malalignment in the cervical spine   04/22/2016 Procedure   Port-a-cath placed.   06/16/2016 Imaging   Ct neck showed right sternocleidomastoid muscle metastasis is further decreased in conspicuity since May, and has mildly decreased in size since September 2016. Continued stability of sub-centimeter left cervical lymph nodes. No new or progressive metastatic disease in the neck.   06/16/2016 Imaging   CT chest showed stable masslike radiation fibrosis in the left upper lobe. Stable subcentimeter pulmonary nodules in the bilateral lower lobes. No new or progressive metastatic disease in the chest. Nonobstructing left  renal stone.   10/13/2016 Imaging   Ct neck showed unchanged right sternocleidomastoid muscle metastasis. Unchanged subcentimeter left cervical lymph nodes. No evidence of new or progressive metastatic disease in the neck.   10/13/2016 Imaging   CT chest showed tiny hypervascular foci in the liver, not definitely seen on prior imaging of 06/16/2016 and 02/28/2016. Abdomen MRI without and with contrast recommended to further evaluate as metastatic disease is a concern. 2. Stable appearance of post treatment changes left upper lung and scattered tiny bilateral pulmonary nodules.   02/11/2017 Imaging   Ct neck: Lymph node mass right posterior neck appears improved from the prior study. Small posterior lymph nodes on the left unchanged. Occluded right jugular vein unchanged.   02/11/2017 Imaging   1. Similar appearance of postsurgical and radiation changes in the left upper lobe. 2. Similar bilateral pulmonary nodules. 3. No thoracic adenopathy. 4. Subtle foci of post-contrast enhancement within the liver are suboptimally characterized on this nondedicated study. Likely similar. These could either be re-evaluated at followup or more entirely characterized with abdominal MRI. 5. Left nephrolithiasis.   05/19/2017 Imaging   Matted lymph node mass right  posterior neck appears larger in the recent CT. Accurate measurements difficult due to infiltrating tumor margins and infiltration of the muscle. Right jugular vein again appears occluded or resected. Small left posterior lymph nodes stable. Left upper lobe airspace density stable and similar to the prior CT   06/03/2017 PET scan   1. Hypermetabolic ill-defined right level IIb lymph node, about 1.3 cm in diameter with maximum SUV 9.5 (formerly 8.1). Appearance suspicious for residual/recurrent malignancy. No worrisome left-sided lesion. 2. Left suprahilar indistinct opacity demonstrates no worrisome hypermetabolic activity. The 5 mm left lower lobe  pulmonary nodule is stable and not currently hypermetabolic although below sensitive PET-CT size thresholds. 3. Other imaging findings of potential clinical significance: Bilateral nonobstructive nephrolithiasis. Chronic bilateral maxillary sinusitis.   05/11/2018 PET scan   1. Continued chronic accentuated metabolic activity in the vicinity of right level IIB and the adjacent right sternocleidomastoid muscle, with ill definition of surrounding tissue planes. Maximum SUV is currently 8.1, formerly 9.5. Accentuated metabolic activity is been present in this vicinity back through 06/25/2015, and there was also some low-level activity in this vicinity on 06/07/2014. Some of this may be from scarring and local muscular activity although clearly a component of residual tumor is difficult to exclude given the focally high activity. 2. Other imaging findings of potential clinical significance: Chronic bilateral maxillary sinusitis. Chronic scarring in the left upper lobe. Chronically stable 5 mm left lower lobe nodule is considered benign. Nonobstructive left nephrolithiasis.   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 COMMENT:No significant keratinization is identified. Other basaloid carcinomas are in the differential diagnosis. No cell block material is available for further testing.   09/12/2018 Procedure   He underwent fine Needle Aspiration   10/04/2018 PET scan   1. Significant progression of local recurrence laterally in the mid right neck with an enlarging, increasingly hypermetabolic soft tissue mass. This involves the right sternocleidomastoid muscle. 2. Small lymph nodes in the right axilla are increasingly hypermetabolic. These are nonspecific and potentially reactive, although could reflect a small metastases. Small hypermetabolic nodule in the  left suprasternal notch is unchanged. 3. No other evidence of metastatic disease.    10/07/2018 - 12/23/2018 Chemotherapy   The patient had cisplatin plus gemzar   12/07/2018 Imaging   1. Decreased size of lateral right neck mass. 2. Unchanged soft tissue nodule in the suprasternal notch. 3. No evidence of new metastatic disease in the neck.    05/30/2019 Imaging   CT neck No clear change or progression compared to the study of March. Overall measurements of the right lateral neck mass are similar, approximately 3 x 1.8 cm. See above discussion. One could argue that there is slight increase in lateral bulging, possibly with an increase in contrast enhancement, towards the inferior margin. This is of questionable validity but could possibly represent some progression or inflammatory change. Other findings in the region are stable.   07/20/2019 - 09/28/2019 Chemotherapy   The patient had palonosetron (ALOXI) injection 0.25 mg, 0.25 mg, Intravenous,  Once, 3 of 4 cycles Administration: 0.25 mg (07/20/2019), 0.25 mg (08/10/2019), 0.25 mg (09/08/2019) CISplatin (PLATINOL) 84 mg in sodium chloride 0.9 % 250 mL chemo infusion, 40 mg/m2 = 84 mg (80 % of original dose 50 mg/m2), Intravenous,  Once, 3 of 4 cycles Dose modification: 40 mg/m2 (80 % of original dose 50 mg/m2, Cycle 1, Reason: Dose Not Tolerated)  Administration: 84 mg (07/20/2019), 84 mg (08/10/2019), 83 mg (09/08/2019) gemcitabine (GEMZAR) 1,600 mg in sodium chloride 0.9 % 250 mL chemo infusion, 1,672 mg (80 % of original dose 1,000 mg/m2), Intravenous,  Once, 3 of 4 cycles Dose modification: 800 mg/m2 (80 % of original dose 1,000 mg/m2, Cycle 1, Reason: Provider Judgment) Administration: 1,600 mg (07/20/2019), 1,600 mg (07/27/2019), 1,600 mg (08/10/2019), 1,600 mg (08/17/2019), 1,600 mg (09/08/2019), 1,672 mg (09/15/2019) ondansetron (ZOFRAN) 8 mg, dexamethasone (DECADRON) 10 mg in sodium chloride 0.9 % 50 mL IVPB, , Intravenous,  Once, 3 of 4  cycles Administration:  (09/15/2019) fosaprepitant (EMEND) 150 mg, dexamethasone (DECADRON) 12 mg in sodium chloride 0.9 % 145 mL IVPB, , Intravenous,  Once, 3 of 4 cycles Administration:  (07/20/2019),  (08/10/2019),  (09/08/2019)  for chemotherapy treatment.    10/02/2019 Imaging   CT neck As compared to 05/30/2019, no significant interval change in size of an ill-defined mass within the right lateral neck, again measuring 3.3 x 1.8 cm in transaxial dimensions.   Unchanged mildly enlarged left level I lymph node measuring 1.1 cm in short axis.   Unchanged node or nodule at the thoracic inlet, measuring 1.3 x 0.8 cm.   Please refer to concurrently performed chest CT for a description of findings below the level of the thoracic inlet.     10/02/2019 Imaging   CT chest 1. No new or progressive findings in the chest to suggest metastatic disease. 2. Bilateral subcentimeter solid pulmonary nodules are stable since 2018. 3. Hyperdense 1.1 cm anterior liver focus, not clearly visualized on prior studies. Suggest MRI abdomen without and with IV contrast for further characterization.   10/12/2019 -  Chemotherapy   The patient had gemcitabine (GEMZAR) 2,090 mg in sodium chloride 0.9 % 250 mL chemo infusion, 1,000 mg/m2 = 2,090 mg, Intravenous,  Once, 0 of 6 cycles  for chemotherapy treatment.      REVIEW OF SYSTEMS:   Constitutional: Denies fevers, chills or abnormal weight loss Eyes: Denies blurriness of vision Ears, nose, mouth, throat, and face: Denies mucositis or sore throat Respiratory: Denies cough, dyspnea or wheezes Cardiovascular: Denies palpitation, chest discomfort or lower extremity swelling Gastrointestinal:  Denies nausea, heartburn or change in bowel habits Skin: Denies abnormal skin rashes Lymphatics: Denies new lymphadenopathy or easy bruising Neurological:Denies numbness, tingling or new weaknesses Behavioral/Psych: Mood is stable, no new changes  All other systems were  reviewed with the patient and are negative.  I have reviewed the past medical history, past surgical history, social history and family history with the patient and they are unchanged from previous note.  ALLERGIES:  is allergic to phenergan [promethazine hcl]; heparin; and clindamycin.  MEDICATIONS:  Current Outpatient Medications  Medication Sig Dispense Refill  . atorvastatin (LIPITOR) 40 MG tablet TK 1 T PO  D    . cetirizine (ZYRTEC) 10 MG tablet Take 10 mg by mouth daily.    Marland Kitchen ibuprofen (ADVIL) 600 MG tablet TK 1 T PO Q 6 H PRN    . levothyroxine (SYNTHROID, LEVOTHROID) 137 MCG tablet TAKE 1 TABLET(137 MCG) BY MOUTH DAILY BEFORE BREAKFAST (Patient taking differently: Take 137 mcg by mouth daily before breakfast. ) 90 tablet 0  . lidocaine (XYLOCAINE) 2 % solution Use as directed 5 mLs in the mouth or throat every 3 (three) hours as needed for mouth pain. Swish, gargle and spit 100 mL 2  . LORazepam (ATIVAN) 1 MG tablet Take 1 tablet (1 mg total) by mouth every 8 (eight) hours  as needed for anxiety. 30 tablet 3  . omeprazole (PRILOSEC) 20 MG capsule Take 1 capsule (20 mg total) by mouth daily. 30 capsule 5  . ondansetron (ZOFRAN) 8 MG tablet Take 1 tablet (8 mg total) by mouth every 8 (eight) hours as needed for nausea or vomiting. 90 tablet 1  . Oxycodone HCl 10 MG TABS Take 1 tablet (10 mg total) by mouth every 8 (eight) hours as needed. 60 tablet 0  . PARoxetine (PAXIL) 20 MG tablet Take 1 tablet (20 mg total) by mouth daily. 30 tablet 5  . polyethylene glycol (MIRALAX) packet Take 17 g by mouth daily. (Patient taking differently: Take 17 g by mouth daily as needed for mild constipation or moderate constipation. ) 14 each 3  . prochlorperazine (COMPAZINE) 10 MG tablet Take 1 tablet (10 mg total) by mouth every 12 (twelve) hours as needed (Nausea or vomiting). 30 tablet 1  . senna-docusate (SENOKOT-S) 8.6-50 MG tablet Take 2 tablets by mouth 3 (three) times daily. 90 tablet 0  . Vitamin  D, Ergocalciferol, (DRISDOL) 1.25 MG (50000 UT) CAPS capsule TK 1 C PO WEEKLY     No current facility-administered medications for this visit.   Facility-Administered Medications Ordered in Other Visits  Medication Dose Route Frequency Provider Last Rate Last Admin  . anticoagulant sodium citrate solution 5 mL  5 mL Intracatheter Once Alvy Bimler, Dastan Krider, MD      . anticoagulant sodium citrate solution 5 mL  5 mL Intracatheter Once [name], MD        PHYSICAL EXAMINATION: ECOG PERFORMANCE STATUS: 1 - Symptomatic but completely ambulatory  Vitals:   10/04/19 1059  BP: (!) 150/98  Pulse: (!) 103  Resp: 18  Temp: 97.8 F (36.6 C)  SpO2: 100%   Filed Weights   10/04/19 1059  Weight: 197 lb (89.4 kg)    GENERAL:alert, no distress and comfortable Musculoskeletal:no cyanosis of digits and no clubbing  NEURO: alert & oriented x 3 with fluent speech, no focal motor/sensory deficits  LABORATORY DATA:  I have reviewed the data as listed    Component Value Date/Time   NA 141 09/15/2019 0909   NA 139 09/22/2017 0829   K 3.6 09/15/2019 0909   K 3.5 09/22/2017 0829   CL 101 09/15/2019 0909   CO2 29 09/15/2019 0909   CO2 26 09/22/2017 0829   GLUCOSE 122 (H) 09/15/2019 0909   GLUCOSE 133 09/22/2017 0829   BUN 19 09/15/2019 0909   BUN 14.1 09/22/2017 0829   CREATININE 1.02 09/15/2019 0909   CREATININE 0.93 02/06/2019 1215   CREATININE 0.9 09/22/2017 0829   CALCIUM 9.2 09/15/2019 0909   CALCIUM 9.1 09/22/2017 0829   PROT 7.1 09/15/2019 0909   PROT 6.8 09/22/2017 0829   ALBUMIN 4.3 09/15/2019 0909   ALBUMIN 4.1 09/22/2017 0829   AST 20 09/15/2019 0909   AST 31 02/06/2019 1215   AST 22 09/22/2017 0829   ALT 27 09/15/2019 0909   ALT 48 (H) 02/06/2019 1215   ALT 30 09/22/2017 0829   ALKPHOS 64 09/15/2019 0909   ALKPHOS 55 09/22/2017 0829   BILITOT 0.3 09/15/2019 0909   BILITOT 0.2 (L) 02/06/2019 1215   BILITOT 0.35 09/22/2017 0829   GFRNONAA >60 09/15/2019 0909   GFRNONAA >60  02/06/2019 1215   GFRAA >60 09/15/2019 0909   GFRAA >60 02/06/2019 1215    No results found for: SPEP, UPEP  Lab Results  Component Value Date   WBC 5.2 09/15/2019  NEUTROABS 2.5 09/15/2019   HGB 12.7 (L) 09/15/2019   HCT 38.0 (L) 09/15/2019   MCV 87.4 09/15/2019   PLT 174 09/15/2019      Chemistry      Component Value Date/Time   NA 141 09/15/2019 0909   NA 139 09/22/2017 0829   K 3.6 09/15/2019 0909   K 3.5 09/22/2017 0829   CL 101 09/15/2019 0909   CO2 29 09/15/2019 0909   CO2 26 09/22/2017 0829   BUN 19 09/15/2019 0909   BUN 14.1 09/22/2017 0829   CREATININE 1.02 09/15/2019 0909   CREATININE 0.93 02/06/2019 1215   CREATININE 0.9 09/22/2017 0829      Component Value Date/Time   CALCIUM 9.2 09/15/2019 0909   CALCIUM 9.1 09/22/2017 0829   ALKPHOS 64 09/15/2019 0909   ALKPHOS 55 09/22/2017 0829   AST 20 09/15/2019 0909   AST 31 02/06/2019 1215   AST 22 09/22/2017 0829   ALT 27 09/15/2019 0909   ALT 48 (H) 02/06/2019 1215   ALT 30 09/22/2017 0829   BILITOT 0.3 09/15/2019 0909   BILITOT 0.2 (L) 02/06/2019 1215   BILITOT 0.35 09/22/2017 0829       RADIOGRAPHIC STUDIES: I have reviewed multiple imaging studies with the patient I have personally reviewed the radiological images as listed and agreed with the findings in the report. CT Soft Tissue Neck W Contrast  Result Date: 10/02/2019 CLINICAL DATA:  Primary cancer of head/neck. Malignant neoplasm metastatic to lung, unspecified laterality. Head/neck cancer, assess treatment response. 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 EXAM: CT NECK WITH CONTRAST TECHNIQUE: Multidetector CT imaging of the neck was performed using the standard protocol following the bolus administration of intravenous contrast. CONTRAST:  42mL OMNIPAQUE IOHEXOL 300 MG/ML  SOLN COMPARISON:  Neck CT 05/30/2019 FINDINGS: Pharynx and larynx: No appreciable mass or swelling. Salivary glands:  Redemonstrated fatty atrophy of the inferior parotid glands bilaterally. The right submandibular gland is absent. The left submandibular gland is unremarkable. Thyroid: Negative Lymph nodes: Unchanged size and appearance of a mildly enlarged left level I lymph node measuring 1.1 cm in short axis (series 602, image 64). No significant interval change in size of an ill-defined soft tissue mass within the right lateral neck centered at the C3-C4 level, again measuring 3.3 x 1.8 cm in transaxial dimensions (remeasured on prior). Redemonstrated chronic scarring within the right neck, including scarring within the right supraclavicular and brachial plexus regions. Unchanged node or nodule at the thoracic inlet at midline, measuring 1.3 x 0.8 cm. No new cervical lymphadenopathy is identified. Vascular: Previous resection of left internal jugular vein. The major vascular structures of the neck otherwise appear patent. Limited intracranial: No abnormality identified. Visualized orbits: Visualized orbits demonstrate no acute abnormality. Mastoids and visualized paranasal sinuses: Left maxillary sinus mucous retention cyst, as well as mild polypoid mucosal thickening within the bilateral inferior maxillary sinuses. No significant mastoid effusion. Skeleton: No acute bony abnormality. Upper chest: Please refer to concurrently performed chest CT for a description of findings below the level of the thoracic inlet. IMPRESSION: As compared to 05/30/2019, no significant interval change in size of an ill-defined mass within the right lateral neck, again measuring 3.3 x 1.8 cm in transaxial dimensions. Unchanged mildly enlarged left level I lymph node measuring 1.1 cm in short axis. Unchanged node or nodule at the thoracic inlet, measuring 1.3 x 0.8 cm. Please refer to concurrently performed chest CT for a description of findings below the  level of the thoracic inlet. Electronically Signed   By: Kellie Simmering DO   On: 10/02/2019 09:27    CT Chest W Contrast  Result Date: 10/02/2019 CLINICAL DATA:  Recurrent metastatic nasopharyngeal cancer. Ongoing chemotherapy. Restaging. EXAM: CT CHEST WITH CONTRAST TECHNIQUE: Multidetector CT imaging of the chest was performed during intravenous contrast administration. CONTRAST:  65mL OMNIPAQUE IOHEXOL 300 MG/ML  SOLN COMPARISON:  10/04/2018 PET-CT.  02/11/2017 chest CT. FINDINGS: Cardiovascular: Normal heart size. No significant pericardial effusion/thickening. Right internal jugular Port-A-Cath terminates at the cavoatrial junction. Great vessels are normal in course and caliber. No central pulmonary emboli. Mediastinum/Nodes: No discrete thyroid nodules. Unremarkable esophagus. No pathologically enlarged axillary, mediastinal or hilar lymph nodes. Lungs/Pleura: No pneumothorax. No pleural effusion. No acute consolidative airspace disease or lung masses. Bandlike left upper lobe sharply marginated consolidation with volume loss and distortion, unchanged, compatible with postradiation change. Similar minimal linear opacity at the right lung apex, unchanged. Right lower lobe 5 mm (series 6/image 82) and left lower lobe 6 mm (series 6/image 72) solid pulmonary nodules are stable since 02/11/2017 chest CT. No new significant pulmonary nodules. Upper abdomen: Hyperdense 1.0 cm right liver dome focus (series 3/image 43), stable since 02/11/2017 chest CT. Hyperdense 1.1 cm anterior liver focus (series 3/image 40), not clearly visualized on prior CT studies. Musculoskeletal:  No aggressive appearing focal osseous lesions. IMPRESSION: 1. No new or progressive findings in the chest to suggest metastatic disease. 2. Bilateral subcentimeter solid pulmonary nodules are stable since 2018. 3. Hyperdense 1.1 cm anterior liver focus, not clearly visualized on prior studies. Suggest MRI abdomen without and with IV contrast for further characterization. Electronically Signed   By: Ilona Sorrel M.D.   On: 10/02/2019 09:32     All questions were answered. The patient knows to call the clinic with any problems, questions or concerns. No barriers to learning was detected.  I spent 25 minutes counseling the patient face to face. The total time spent in the appointment was 30 minutes and more than 50% was on counseling and review of test results  [name], MD 10/05/2019 7:53 AM

## 2019-10-05 NOTE — Assessment & Plan Note (Signed)
CT scan of the chest shows stable changes He is not symptomatic Observe

## 2019-10-05 NOTE — Assessment & Plan Note (Signed)
There is a lesion seen on CT imaging He is not symptomatic Observe only.

## 2019-10-05 NOTE — Assessment & Plan Note (Signed)
I have reviewed multiple imaging studies with the patient Overall, he has stable disease However, I am concerned about discontinuation of treatment given rapid relapse the last time Recommend maintaining treatment with gemcitabine He tolerated gemcitabine fairly well I recommend modifying treatment schedule to give him on days 1 and 15, rest day 8 and 22 for cycle of every 28 days With single agent gemcitabine, I anticipate he will tolerate that well and I will recommend full dose at 1000 mg/m We will repeat imaging study in about 3 months after starting on treatment He will call me when he is ready to schedule chemotherapy maintenance

## 2019-10-05 NOTE — Assessment & Plan Note (Signed)
He understood goals of care is of palliative nature

## 2019-10-05 NOTE — Assessment & Plan Note (Signed)
He has stable pain control He will continue oxycodone at low-dose

## 2019-10-17 ENCOUNTER — Other Ambulatory Visit: Payer: Self-pay | Admitting: Hematology and Oncology

## 2019-10-17 ENCOUNTER — Telehealth: Payer: Self-pay | Admitting: Hematology and Oncology

## 2019-10-17 ENCOUNTER — Telehealth: Payer: Self-pay

## 2019-10-17 NOTE — Telephone Encounter (Signed)
Called back and gave below message. He verbalized understanding.

## 2019-10-17 NOTE — Telephone Encounter (Signed)
He called and left a message to call him.  Called back. His preferred day to get treatment is Friday.

## 2019-10-17 NOTE — Telephone Encounter (Signed)
Scheduled appt per 1/12 sch message - unable to reach pt .left message for patient with appt date and time

## 2019-10-17 NOTE — Telephone Encounter (Signed)
I sent scheduling msg, start this Friday, every 2 weeks

## 2019-10-20 ENCOUNTER — Other Ambulatory Visit: Payer: Self-pay

## 2019-10-20 ENCOUNTER — Inpatient Hospital Stay: Payer: Medicaid Other

## 2019-10-20 ENCOUNTER — Inpatient Hospital Stay: Payer: Medicaid Other | Attending: Hematology and Oncology

## 2019-10-20 VITALS — BP 132/90 | HR 79 | Temp 98.3°F | Resp 20 | Wt 195.8 lb

## 2019-10-20 DIAGNOSIS — Z95828 Presence of other vascular implants and grafts: Secondary | ICD-10-CM

## 2019-10-20 DIAGNOSIS — R112 Nausea with vomiting, unspecified: Secondary | ICD-10-CM | POA: Insufficient documentation

## 2019-10-20 DIAGNOSIS — C119 Malignant neoplasm of nasopharynx, unspecified: Secondary | ICD-10-CM | POA: Diagnosis present

## 2019-10-20 DIAGNOSIS — C7800 Secondary malignant neoplasm of unspecified lung: Secondary | ICD-10-CM

## 2019-10-20 DIAGNOSIS — C760 Malignant neoplasm of head, face and neck: Secondary | ICD-10-CM | POA: Diagnosis not present

## 2019-10-20 DIAGNOSIS — R110 Nausea: Secondary | ICD-10-CM | POA: Insufficient documentation

## 2019-10-20 DIAGNOSIS — Z7189 Other specified counseling: Secondary | ICD-10-CM

## 2019-10-20 DIAGNOSIS — E039 Hypothyroidism, unspecified: Secondary | ICD-10-CM | POA: Diagnosis not present

## 2019-10-20 DIAGNOSIS — Z5111 Encounter for antineoplastic chemotherapy: Secondary | ICD-10-CM | POA: Diagnosis not present

## 2019-10-20 DIAGNOSIS — G893 Neoplasm related pain (acute) (chronic): Secondary | ICD-10-CM

## 2019-10-20 LAB — COMPREHENSIVE METABOLIC PANEL
ALT: 25 U/L (ref 0–44)
AST: 25 U/L (ref 15–41)
Albumin: 4.5 g/dL (ref 3.5–5.0)
Alkaline Phosphatase: 72 U/L (ref 38–126)
Anion gap: 12 (ref 5–15)
BUN: 12 mg/dL (ref 6–20)
CO2: 26 mmol/L (ref 22–32)
Calcium: 9.4 mg/dL (ref 8.9–10.3)
Chloride: 103 mmol/L (ref 98–111)
Creatinine, Ser: 1.04 mg/dL (ref 0.61–1.24)
GFR calc Af Amer: >60 mL/min (ref >60)
GFR calc non Af Amer: >60 mL/min (ref >60)
Glucose, Bld: 120 mg/dL — ABNORMAL HIGH (ref 70–99)
Potassium: 3.4 mmol/L — ABNORMAL LOW (ref 3.5–5.1)
Sodium: 141 mmol/L (ref 135–145)
Total Bilirubin: 0.4 mg/dL (ref 0.3–1.2)
Total Protein: 7.4 g/dL (ref 6.5–8.1)

## 2019-10-20 LAB — CBC WITH DIFFERENTIAL/PLATELET
Abs Immature Granulocytes: 0.02 10*3/uL (ref 0.00–0.07)
Basophils Absolute: 0 10*3/uL (ref 0.0–0.1)
Basophils Relative: 0 %
Eosinophils Absolute: 0.1 10*3/uL (ref 0.0–0.5)
Eosinophils Relative: 2 %
HCT: 40.4 % (ref 39.0–52.0)
Hemoglobin: 13.5 g/dL (ref 13.0–17.0)
Immature Granulocytes: 0 %
Lymphocytes Relative: 22 %
Lymphs Abs: 1.6 10*3/uL (ref 0.7–4.0)
MCH: 30.4 pg (ref 26.0–34.0)
MCHC: 33.4 g/dL (ref 30.0–36.0)
MCV: 91 fL (ref 80.0–100.0)
Monocytes Absolute: 0.7 10*3/uL (ref 0.1–1.0)
Monocytes Relative: 10 %
Neutro Abs: 4.8 10*3/uL (ref 1.7–7.7)
Neutrophils Relative %: 66 %
Platelets: 158 10*3/uL (ref 150–400)
RBC: 4.44 MIL/uL (ref 4.22–5.81)
RDW: 17.1 % — ABNORMAL HIGH (ref 11.5–15.5)
WBC: 7.3 10*3/uL (ref 4.0–10.5)
nRBC: 0 % (ref 0.0–0.2)

## 2019-10-20 MED ORDER — SODIUM CHLORIDE 0.9% FLUSH
10.0000 mL | INTRAVENOUS | Status: DC | PRN
  Administered 2019-10-20: 14:00:00 10 mL
  Filled 2019-10-20: qty 10

## 2019-10-20 MED ORDER — SODIUM CHLORIDE 0.9% FLUSH
10.0000 mL | Freq: Once | INTRAVENOUS | Status: AC
  Administered 2019-10-20: 10 mL
  Filled 2019-10-20: qty 10

## 2019-10-20 MED ORDER — PROCHLORPERAZINE MALEATE 10 MG PO TABS
ORAL_TABLET | ORAL | Status: AC
  Filled 2019-10-20: qty 1

## 2019-10-20 MED ORDER — SODIUM CHLORIDE 0.9 % IV SOLN
2000.0000 mg | Freq: Once | INTRAVENOUS | Status: AC
  Administered 2019-10-20: 13:00:00 2000 mg via INTRAVENOUS
  Filled 2019-10-20: qty 52.6

## 2019-10-20 MED ORDER — PROCHLORPERAZINE MALEATE 10 MG PO TABS
10.0000 mg | ORAL_TABLET | Freq: Once | ORAL | Status: AC
  Administered 2019-10-20: 12:00:00 10 mg via ORAL

## 2019-10-20 MED ORDER — SODIUM CHLORIDE 0.9 % IV SOLN
Freq: Once | INTRAVENOUS | Status: AC
  Filled 2019-10-20: qty 250

## 2019-10-20 MED ORDER — ANTICOAGULANT SODIUM CITRATE 4% (200MG/5ML) IV SOLN
5.0000 mL | Freq: Once | Status: AC
  Administered 2019-10-20: 5 mL via INTRAVENOUS
  Filled 2019-10-20: qty 5

## 2019-10-20 MED ORDER — OXYCODONE-ACETAMINOPHEN 5-325 MG PO TABS
ORAL_TABLET | ORAL | Status: AC
  Filled 2019-10-20: qty 2

## 2019-10-20 MED ORDER — OXYCODONE-ACETAMINOPHEN 5-325 MG PO TABS
2.0000 | ORAL_TABLET | Freq: Once | ORAL | Status: AC
  Administered 2019-10-20: 12:00:00 2 via ORAL

## 2019-10-20 NOTE — Patient Instructions (Signed)
[facility] Discharge Instructions for Patients Receiving Chemotherapy  Today you received the following chemotherapy agent: Gemzar  To help prevent nausea and vomiting after your treatment, we encourage you to take your nausea medication as directed by you MD.   If you develop nausea and vomiting that is not controlled by your nausea medication, call the clinic.   BELOW ARE SYMPTOMS THAT SHOULD BE REPORTED IMMEDIATELY:  *FEVER GREATER THAN 100.5 F  *CHILLS WITH OR WITHOUT FEVER  NAUSEA AND VOMITING THAT IS NOT CONTROLLED WITH YOUR NAUSEA MEDICATION  *UNUSUAL SHORTNESS OF BREATH  *UNUSUAL BRUISING OR BLEEDING  TENDERNESS IN MOUTH AND THROAT WITH OR WITHOUT PRESENCE OF ULCERS  *URINARY PROBLEMS  *BOWEL PROBLEMS  UNUSUAL RASH Items with * indicate a potential emergency and should be followed up as soon as possible.  Feel free to call the clinic should you have any questions or concerns. The clinic phone number is [phone].  Please show the [facility] at check-in to the Emergency Department and triage nurse.  Coronavirus (COVID-19) Are you at risk?  Are you at risk for the Coronavirus (COVID-19)?  To be considered HIGH RISK for Coronavirus (COVID-19), you have to meet the following criteria:  . Traveled to [place], [place], [place], [place] or [place]; or in the [place] to [place], [place], [place], or [place]; and have fever, cough, and shortness of breath within the last 2 weeks of travel OR . Been in close contact with a person diagnosed with COVID-19 within the last 2 weeks and have fever, cough, and shortness of breath . IF YOU DO NOT MEET THESE CRITERIA, YOU ARE CONSIDERED LOW RISK FOR COVID-19.  What to do if you are HIGH RISK for COVID-19?  Marland Kitchen If you are having a medical emergency, call 911. . Seek medical care right away. Before you go to a doctor's office, urgent care or emergency department, call ahead and tell them about  your recent travel, contact with someone diagnosed with COVID-19, and your symptoms. You should receive instructions from your physician's office regarding next steps of care.  . When you arrive at healthcare provider, tell the healthcare staff immediately you have returned from visiting [place], [place], [place], [place] or [place]; or traveled in the [place] to [place], [place], [place], or [place]; in the last two weeks or you have been in close contact with a person diagnosed with COVID-19 in the last 2 weeks.   . Tell the health care staff about your symptoms: fever, cough and shortness of breath. . After you have been seen by a medical provider, you will be either: o Tested for (COVID-19) and discharged home on quarantine except to seek medical care if symptoms worsen, and asked to  - Stay home and avoid contact with others until you get your results (4-5 days)  - Avoid travel on public transportation if possible (such as bus, train, or airplane) or o Sent to the Emergency Department by EMS for evaluation, COVID-19 testing, and possible admission depending on your condition and test results.  What to do if you are LOW RISK for COVID-19?  Reduce your risk of any infection by using the same precautions used for avoiding the common cold or flu:  Marland Kitchen Wash your hands often with soap and warm water for at least 20 seconds.  If soap and water are not readily available, use an alcohol-based hand sanitizer with at least 60% alcohol.  . If  coughing or sneezing, cover your mouth and nose by coughing or sneezing into the elbow areas of your shirt or coat, into a tissue or into your sleeve (not your hands). . Avoid shaking hands with others and consider head nods or verbal greetings only. . Avoid touching your eyes, nose, or mouth with unwashed hands.  . Avoid close contact with people who are sick. . Avoid places or events with large numbers of people in one location, like concerts or sporting  events. . Carefully consider travel plans you have or are making. . If you are planning any travel outside or inside the [place], visit the CDC's Travelers' Health webpage for the latest health notices. . If you have some symptoms but not all symptoms, continue to monitor at home and seek medical attention if your symptoms worsen. . If you are having a medical emergency, call 911.   [place] / e-Visit: [email or web address]         [facility]: [facility]: [phone]                   [facility]: [phone]

## 2019-10-20 NOTE — Patient Instructions (Signed)

## 2019-10-30 ENCOUNTER — Telehealth: Payer: Self-pay | Admitting: *Deleted

## 2019-10-30 NOTE — Telephone Encounter (Signed)
Telephone call received from patient requesting refill of Oxycodone please

## 2019-10-31 ENCOUNTER — Other Ambulatory Visit: Payer: Self-pay | Admitting: Hematology and Oncology

## 2019-10-31 MED ORDER — OXYCODONE HCL 10 MG PO TABS: 10.0000 mg | ORAL_TABLET | Freq: Three times a day (TID) | ORAL | 0 refills | Status: DC | PRN

## 2019-10-31 NOTE — Telephone Encounter (Signed)
done

## 2019-11-01 ENCOUNTER — Other Ambulatory Visit: Payer: Self-pay | Admitting: Hematology and Oncology

## 2019-11-03 ENCOUNTER — Other Ambulatory Visit: Payer: Self-pay

## 2019-11-03 ENCOUNTER — Encounter: Payer: Self-pay | Admitting: Hematology and Oncology

## 2019-11-03 ENCOUNTER — Inpatient Hospital Stay: Payer: Medicaid Other

## 2019-11-03 ENCOUNTER — Inpatient Hospital Stay (HOSPITAL_BASED_OUTPATIENT_CLINIC_OR_DEPARTMENT_OTHER): Payer: Medicaid Other | Admitting: Hematology and Oncology

## 2019-11-03 ENCOUNTER — Telehealth: Payer: Self-pay

## 2019-11-03 DIAGNOSIS — C760 Malignant neoplasm of head, face and neck: Secondary | ICD-10-CM

## 2019-11-03 DIAGNOSIS — E039 Hypothyroidism, unspecified: Secondary | ICD-10-CM

## 2019-11-03 DIAGNOSIS — C7800 Secondary malignant neoplasm of unspecified lung: Secondary | ICD-10-CM

## 2019-11-03 DIAGNOSIS — Z5111 Encounter for antineoplastic chemotherapy: Secondary | ICD-10-CM | POA: Diagnosis not present

## 2019-11-03 DIAGNOSIS — Z95828 Presence of other vascular implants and grafts: Secondary | ICD-10-CM

## 2019-11-03 DIAGNOSIS — Z7189 Other specified counseling: Secondary | ICD-10-CM

## 2019-11-03 DIAGNOSIS — R112 Nausea with vomiting, unspecified: Secondary | ICD-10-CM

## 2019-11-03 DIAGNOSIS — G893 Neoplasm related pain (acute) (chronic): Secondary | ICD-10-CM | POA: Diagnosis not present

## 2019-11-03 DIAGNOSIS — R5382 Chronic fatigue, unspecified: Secondary | ICD-10-CM

## 2019-11-03 LAB — COMPREHENSIVE METABOLIC PANEL
ALT: 62 U/L — ABNORMAL HIGH (ref 0–44)
AST: 39 U/L (ref 15–41)
Albumin: 4.3 g/dL (ref 3.5–5.0)
Alkaline Phosphatase: 63 U/L (ref 38–126)
Anion gap: 6 (ref 5–15)
BUN: 12 mg/dL (ref 6–20)
CO2: 28 mmol/L (ref 22–32)
Calcium: 9 mg/dL (ref 8.9–10.3)
Chloride: 103 mmol/L (ref 98–111)
Creatinine, Ser: 0.94 mg/dL (ref 0.61–1.24)
GFR calc Af Amer: >60 mL/min (ref >60)
GFR calc non Af Amer: >60 mL/min (ref >60)
Glucose, Bld: 127 mg/dL — ABNORMAL HIGH (ref 70–99)
Potassium: 3.7 mmol/L (ref 3.5–5.1)
Sodium: 137 mmol/L (ref 135–145)
Total Bilirubin: 0.3 mg/dL (ref 0.3–1.2)
Total Protein: 7 g/dL (ref 6.5–8.1)

## 2019-11-03 LAB — CBC WITH DIFFERENTIAL/PLATELET
Abs Immature Granulocytes: 0.02 10*3/uL (ref 0.00–0.07)
Basophils Absolute: 0 10*3/uL (ref 0.0–0.1)
Basophils Relative: 0 %
Eosinophils Absolute: 0.1 10*3/uL (ref 0.0–0.5)
Eosinophils Relative: 2 %
HCT: 39.6 % (ref 39.0–52.0)
Hemoglobin: 12.9 g/dL — ABNORMAL LOW (ref 13.0–17.0)
Immature Granulocytes: 0 %
Lymphocytes Relative: 26 %
Lymphs Abs: 1.5 10*3/uL (ref 0.7–4.0)
MCH: 30.2 pg (ref 26.0–34.0)
MCHC: 32.6 g/dL (ref 30.0–36.0)
MCV: 92.7 fL (ref 80.0–100.0)
Monocytes Absolute: 0.6 10*3/uL (ref 0.1–1.0)
Monocytes Relative: 10 %
Neutro Abs: 3.5 10*3/uL (ref 1.7–7.7)
Neutrophils Relative %: 62 %
Platelets: 348 10*3/uL (ref 150–400)
RBC: 4.27 MIL/uL (ref 4.22–5.81)
RDW: 16.7 % — ABNORMAL HIGH (ref 11.5–15.5)
WBC: 5.7 10*3/uL (ref 4.0–10.5)
nRBC: 0 % (ref 0.0–0.2)

## 2019-11-03 LAB — TSH: TSH: 8.903 u[IU]/mL — ABNORMAL HIGH (ref 0.320–4.118)

## 2019-11-03 MED ORDER — SODIUM CHLORIDE 0.9% FLUSH
10.0000 mL | Freq: Once | INTRAVENOUS | Status: DC | PRN
  Filled 2019-11-03: qty 10

## 2019-11-03 MED ORDER — HEPARIN SOD (PORK) LOCK FLUSH 100 UNIT/ML IV SOLN
500.0000 [IU] | Freq: Once | INTRAVENOUS | Status: DC | PRN
  Filled 2019-11-03: qty 5

## 2019-11-03 MED ORDER — SODIUM CHLORIDE 0.9% FLUSH
3.0000 mL | Freq: Once | INTRAVENOUS | Status: DC | PRN
  Filled 2019-11-03: qty 10

## 2019-11-03 MED ORDER — SODIUM CHLORIDE 0.9 % IV SOLN
2000.0000 mg | Freq: Once | INTRAVENOUS | Status: AC
  Administered 2019-11-03: 2000 mg via INTRAVENOUS
  Filled 2019-11-03: qty 52.6

## 2019-11-03 MED ORDER — HEPARIN SOD (PORK) LOCK FLUSH 100 UNIT/ML IV SOLN
250.0000 [IU] | Freq: Once | INTRAVENOUS | Status: DC | PRN
  Filled 2019-11-03: qty 5

## 2019-11-03 MED ORDER — ONDANSETRON HCL 4 MG/2ML IJ SOLN
INTRAMUSCULAR | Status: AC
  Filled 2019-11-03: qty 4

## 2019-11-03 MED ORDER — PROCHLORPERAZINE MALEATE 10 MG PO TABS
10.0000 mg | ORAL_TABLET | Freq: Once | ORAL | Status: AC
  Administered 2019-11-03: 10 mg via ORAL

## 2019-11-03 MED ORDER — ANTICOAGULANT SODIUM CITRATE 4% (200MG/5ML) IV SOLN
5.0000 mL | Freq: Once | Status: AC
  Administered 2019-11-03: 5 mL
  Filled 2019-11-03: qty 5

## 2019-11-03 MED ORDER — SODIUM CHLORIDE 0.9 % IV SOLN
Freq: Once | INTRAVENOUS | Status: DC
  Filled 2019-11-03: qty 250

## 2019-11-03 MED ORDER — SODIUM CHLORIDE 0.9% FLUSH
10.0000 mL | INTRAVENOUS | Status: DC | PRN
  Administered 2019-11-03: 10 mL
  Filled 2019-11-03: qty 10

## 2019-11-03 MED ORDER — PROCHLORPERAZINE MALEATE 10 MG PO TABS
ORAL_TABLET | ORAL | Status: AC
  Filled 2019-11-03: qty 1

## 2019-11-03 MED ORDER — SODIUM CHLORIDE 0.9% FLUSH
10.0000 mL | Freq: Once | INTRAVENOUS | Status: AC
  Administered 2019-11-03: 10 mL
  Filled 2019-11-03: qty 10

## 2019-11-03 MED ORDER — ALTEPLASE 2 MG IJ SOLR
2.0000 mg | Freq: Once | INTRAMUSCULAR | Status: DC | PRN
  Filled 2019-11-03: qty 2

## 2019-11-03 MED ORDER — SODIUM CHLORIDE 0.9 % IV SOLN
Freq: Once | INTRAVENOUS | Status: AC
  Filled 2019-11-03: qty 250

## 2019-11-03 MED ORDER — ONDANSETRON HCL 4 MG/2ML IJ SOLN
8.0000 mg | Freq: Once | INTRAMUSCULAR | Status: AC
  Administered 2019-11-03: 8 mg via INTRAVENOUS

## 2019-11-03 NOTE — Assessment & Plan Note (Signed)
TSH is pending I will continue to adjust his thyroid medicine as needed

## 2019-11-03 NOTE — Assessment & Plan Note (Signed)
He complained of fatigue When we discussed further, it appears that he has poor sleep hygiene We discussed conservative approach to help him sleep better and in effect might improve his energy

## 2019-11-03 NOTE — Patient Instructions (Signed)
[facility] Discharge Instructions for Patients Receiving Chemotherapy  Today you received the following chemotherapy agent: Gemzar  To help prevent nausea and vomiting after your treatment, we encourage you to take your nausea medication as directed by you MD.   If you develop nausea and vomiting that is not controlled by your nausea medication, call the clinic.   BELOW ARE SYMPTOMS THAT SHOULD BE REPORTED IMMEDIATELY:  *FEVER GREATER THAN 100.5 F  *CHILLS WITH OR WITHOUT FEVER  NAUSEA AND VOMITING THAT IS NOT CONTROLLED WITH YOUR NAUSEA MEDICATION  *UNUSUAL SHORTNESS OF BREATH  *UNUSUAL BRUISING OR BLEEDING  TENDERNESS IN MOUTH AND THROAT WITH OR WITHOUT PRESENCE OF ULCERS  *URINARY PROBLEMS  *BOWEL PROBLEMS  UNUSUAL RASH Items with * indicate a potential emergency and should be followed up as soon as possible.  Feel free to call the clinic should you have any questions or concerns. The clinic phone number is [phone].  Please show the [facility] at check-in to the Emergency Department and triage nurse.  Coronavirus (COVID-19) Are you at risk?  Are you at risk for the Coronavirus (COVID-19)?  To be considered HIGH RISK for Coronavirus (COVID-19), you have to meet the following criteria:  . Traveled to [place], [place], [place], [place] or [place]; or in the [place] to [place], [place], [place], or [place]; and have fever, cough, and shortness of breath within the last 2 weeks of travel OR . Been in close contact with a person diagnosed with COVID-19 within the last 2 weeks and have fever, cough, and shortness of breath . IF YOU DO NOT MEET THESE CRITERIA, YOU ARE CONSIDERED LOW RISK FOR COVID-19.  What to do if you are HIGH RISK for COVID-19?  Marland Kitchen If you are having a medical emergency, call 911. . Seek medical care right away. Before you go to a doctor's office, urgent care or emergency department, call ahead and tell them about  your recent travel, contact with someone diagnosed with COVID-19, and your symptoms. You should receive instructions from your physician's office regarding next steps of care.  . When you arrive at healthcare provider, tell the healthcare staff immediately you have returned from visiting [place], [place], [place], [place] or [place]; or traveled in the [place] to [place], [place], [place], or [place]; in the last two weeks or you have been in close contact with a person diagnosed with COVID-19 in the last 2 weeks.   . Tell the health care staff about your symptoms: fever, cough and shortness of breath. . After you have been seen by a medical provider, you will be either: o Tested for (COVID-19) and discharged home on quarantine except to seek medical care if symptoms worsen, and asked to  - Stay home and avoid contact with others until you get your results (4-5 days)  - Avoid travel on public transportation if possible (such as bus, train, or airplane) or o Sent to the Emergency Department by EMS for evaluation, COVID-19 testing, and possible admission depending on your condition and test results.  What to do if you are LOW RISK for COVID-19?  Reduce your risk of any infection by using the same precautions used for avoiding the common cold or flu:  Marland Kitchen Wash your hands often with soap and warm water for at least 20 seconds.  If soap and water are not readily available, use an alcohol-based hand sanitizer with at least 60% alcohol.  . If  coughing or sneezing, cover your mouth and nose by coughing or sneezing into the elbow areas of your shirt or coat, into a tissue or into your sleeve (not your hands). . Avoid shaking hands with others and consider head nods or verbal greetings only. . Avoid touching your eyes, nose, or mouth with unwashed hands.  . Avoid close contact with people who are sick. . Avoid places or events with large numbers of people in one location, like concerts or sporting  events. . Carefully consider travel plans you have or are making. . If you are planning any travel outside or inside the [place], visit the CDC's Travelers' Health webpage for the latest health notices. . If you have some symptoms but not all symptoms, continue to monitor at home and seek medical attention if your symptoms worsen. . If you are having a medical emergency, call 911.   [place] / e-Visit: [email or web address]         [facility]: [facility]: [phone]                   [facility]: [phone]

## 2019-11-03 NOTE — Telephone Encounter (Signed)
Called with below message. He verbalized understanding. He has not been taking the synthroid medication as ordered. He will start taking it as ordered.

## 2019-11-03 NOTE — Assessment & Plan Note (Signed)
So far, he tolerated gemcitabine well without major side effects apart from some mild nausea The skin nodule on the right side of his neck is stable I recommend minimum 3 months of treatment before repeat imaging study, due end of April Per previous discussion, we will do treatment every other week

## 2019-11-03 NOTE — Assessment & Plan Note (Signed)
He has stable pain control He will continue oxycodone at low-dose

## 2019-11-03 NOTE — Assessment & Plan Note (Signed)
With single agent gemcitabine, he tolerated treatment better He had mild nausea, well controlled with antiemetics We will continue the same

## 2019-11-03 NOTE — Telephone Encounter (Signed)
-----   Message from [name], MD sent at 11/03/2019  2:03 PM EST ----- Regarding: TSH is a bit high Has he been taking his synthroid consistently? If yes, he needs to take 2 extra pills per week (if he has already pick up his prescription from yesterday) If he has not pick up his prescription, change it to 150 mcg daily, call pharmacy and e-scribe If no, then no need to change order, continue same dose daily

## 2019-11-03 NOTE — Progress Notes (Signed)
[facility] OFFICE PROGRESS NOTE  Patient Care Team: Patient, No Pcp Per as PCP - General (General Practice) [name], MD as Consulting Physician (Hematology and Oncology) Eppie Gibson, MD as Attending Physician (Radiation Oncology) Jodi Marble, MD as Consulting Physician (Otolaryngology) Philomena Doheny, MD as Referring Physician (Plastic Surgery) Irene Shipper, MD as Consulting Physician (Gastroenterology) System, Provider Not In  ASSESSMENT & PLAN:  Primary cancer of head and neck [facility]) So far, he tolerated gemcitabine well without major side effects apart from some mild nausea The skin nodule on the right side of his neck is stable I recommend minimum 3 months of treatment before repeat imaging study, due end of April Per previous discussion, we will do treatment every other week  Acquired hypothyroidism TSH is pending I will continue to adjust his thyroid medicine as needed  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   All questions were answered. The patient knows to call the clinic with any problems, questions or concerns. The total time spent in the appointment was 20 minutes encounter with patients including review of chart and various tests results, discussions about plan of care and coordination of care plan   [name], MD 11/03/2019 1:26 PM  INTERVAL HISTORY: Please see below for problem oriented charting. 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 Pan-endoscopy elsewhere showed cancer from nasopharyngeal space.   01/04/2007 - 02/20/2007 Chemotherapy   He received 2 cycles of cisplatin and 5FU followed by concurrent chemo with weekly cisplatin and radiation. He only received 2 doses of chemo due to severe mucositis, nausea and weight loss.   04/05/2007 - 08/04/2007 Chemotherapy   He received 4 more courses of cisplatin with 5FU and had complete response   07/05/2009 Procedure   Fine-needle aspirate of the right level II lymph nodes come from recurrent metastatic disease. Repeat endoscopy and CT scan show no evidence of disease elsewhere.   07/08/2009 - 12/02/2009 Chemotherapy   He was given 6 cycles of carboplatin, 5-FU and docetaxel   12/03/2009 Surgery   He has surgery to the residual lymph node on the right neck which showed no evidence of disease.   02/22/2012 Imaging  Repeat imaging study showed large recurrent mass. He was referred elsewhere for further treatment.   05/03/2012 Surgery   He underwent left upper lobectomy.   04/29/2013 Imaging   PEt scan showed lesion on right level II B and lower lung was abnormal   06/03/2013 - 02/02/2014 Chemotherapy   He had 6  cycles of chemotherapy when he was found to have recurrence of cancer and had received oxaliplatin and capecitabine   06/07/2014 Imaging   PET CT scan showed persistent disease in the right neck lymph nodes and left lung   06/29/2014 Procedure   Accession: [id number] repeat LUL biopsy confirmed metastatic cancer   07/18/2014 - 07/31/2014 Radiation Therapy   He received palliative radiation therapy to the lungs   10/10/2014 Imaging   CT scan of the chest, abdomen and pelvis show regression in the size of the lung nodule in the left upper lobe and stable pulmonary nodules   01/24/2015 Imaging   CT scan showed stable disease in neck and lung   06/19/2015 Imaging   CT scan of the neck and the chest show possible mild progression of the nodule in the right side of the neck.   06/25/2015 Imaging   PET scan confirmed disease recurrence in the neck   07/07/2015 Imaging   He had MRI neck [facility] [place]   09/03/2015 - 08/26/2018 Chemotherapy   He received palliative chemo with Nivolumab   10/29/2015 Imaging   PET CT showed positive response to Rx   02/28/2016 Imaging   Ct abdomen showed abnormal thinkening in his stomach   03/03/2016 Imaging   CT: Right sternocleidomastoid muscle metastasis appears less distinct but otherwise not significantly changed in size or configuration since 06/19/2015.2. Left level 3 lymph node which was hypermetabolic by PET-CT in January 2017 appears slightly smaller   04/01/2016 Imaging   CT cervical spine showed no acute fracture or traumatic malalignment in the cervical spine   04/22/2016 Procedure   Port-a-cath placed.   06/16/2016 Imaging   Ct neck showed right sternocleidomastoid muscle metastasis is further decreased in conspicuity since May, and has mildly decreased in size since September 2016. Continued stability of sub-centimeter left cervical lymph nodes. No new or progressive metastatic disease in the neck.   06/16/2016 Imaging   CT chest showed stable masslike  radiation fibrosis in the left upper lobe. Stable subcentimeter pulmonary nodules in the bilateral lower lobes. No new or progressive metastatic disease in the chest. Nonobstructing left renal stone.   10/13/2016 Imaging   Ct neck showed unchanged right sternocleidomastoid muscle metastasis. Unchanged subcentimeter left cervical lymph nodes. No evidence of new or progressive metastatic disease in the neck.   10/13/2016 Imaging   CT chest showed tiny hypervascular foci in the liver, not definitely seen on prior imaging of 06/16/2016 and 02/28/2016. Abdomen MRI without and with contrast recommended to further evaluate as metastatic disease is a concern. 2. Stable appearance of post treatment changes left upper lung and scattered tiny bilateral pulmonary nodules.   02/11/2017 Imaging   Ct neck: Lymph node mass right posterior neck appears improved from the prior study. Small posterior lymph nodes on the left unchanged. Occluded right jugular vein unchanged.   02/11/2017 Imaging   1. Similar appearance of postsurgical and radiation changes in the left upper lobe. 2. Similar bilateral pulmonary nodules. 3. No thoracic adenopathy. 4. Subtle foci of post-contrast enhancement within the liver are suboptimally characterized on this nondedicated study. Likely similar. These could either be re-evaluated at followup or  more entirely characterized with abdominal MRI. 5. Left nephrolithiasis.   05/19/2017 Imaging   Matted lymph node mass right posterior neck appears larger in the recent CT. Accurate measurements difficult due to infiltrating tumor margins and infiltration of the muscle. Right jugular vein again appears occluded or resected. Small left posterior lymph nodes stable. Left upper lobe airspace density stable and similar to the prior CT   06/03/2017 PET scan   1. Hypermetabolic ill-defined right level IIb lymph node, about 1.3 cm in diameter with maximum SUV 9.5 (formerly 8.1). Appearance suspicious for  residual/recurrent malignancy. No worrisome left-sided lesion. 2. Left suprahilar indistinct opacity demonstrates no worrisome hypermetabolic activity. The 5 mm left lower lobe pulmonary nodule is stable and not currently hypermetabolic although below sensitive PET-CT size thresholds. 3. Other imaging findings of potential clinical significance: Bilateral nonobstructive nephrolithiasis. Chronic bilateral maxillary sinusitis.   05/11/2018 PET scan   1. Continued chronic accentuated metabolic activity in the vicinity of right level IIB and the adjacent right sternocleidomastoid muscle, with ill definition of surrounding tissue planes. Maximum SUV is currently 8.1, formerly 9.5. Accentuated metabolic activity is been present in this vicinity back through 06/25/2015, and there was also some low-level activity in this vicinity on 06/07/2014. Some of this may be from scarring and local muscular activity although clearly a component of residual tumor is difficult to exclude given the focally high activity. 2. Other imaging findings of potential clinical significance: Chronic bilateral maxillary sinusitis. Chronic scarring in the left upper lobe. Chronically stable 5 mm left lower lobe nodule is considered benign. Nonobstructive left nephrolithiasis.   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 COMMENT:No significant keratinization is identified. Other basaloid carcinomas are in the differential diagnosis. No cell block material is available for further testing.   09/12/2018 Procedure   He underwent fine Needle Aspiration   10/04/2018 PET scan   1. Significant progression of local recurrence laterally in the mid right neck with an enlarging, increasingly hypermetabolic soft tissue mass. This involves the right sternocleidomastoid muscle. 2. Small lymph nodes in  the right axilla are increasingly hypermetabolic. These are nonspecific and potentially reactive, although could reflect a small metastases. Small hypermetabolic nodule in the left suprasternal notch is unchanged. 3. No other evidence of metastatic disease.    10/07/2018 - 12/23/2018 Chemotherapy   The patient had cisplatin plus gemzar   12/07/2018 Imaging   1. Decreased size of lateral right neck mass. 2. Unchanged soft tissue nodule in the suprasternal notch. 3. No evidence of new metastatic disease in the neck.    05/30/2019 Imaging   CT neck No clear change or progression compared to the study of March. Overall measurements of the right lateral neck mass are similar, approximately 3 x 1.8 cm. See above discussion. One could argue that there is slight increase in lateral bulging, possibly with an increase in contrast enhancement, towards the inferior margin. This is of questionable validity but could possibly represent some progression or inflammatory change. Other findings in the region are stable.   07/20/2019 - 09/28/2019 Chemotherapy   The patient had palonosetron (ALOXI) injection 0.25 mg, 0.25 mg, Intravenous,  Once, 3 of 4 cycles Administration: 0.25 mg (07/20/2019), 0.25 mg (08/10/2019), 0.25 mg (09/08/2019) CISplatin (PLATINOL) 84 mg in sodium chloride 0.9 % 250 mL chemo infusion, 40 mg/m2 = 84 mg (80 % of original dose 50 mg/m2), Intravenous,  Once, 3  of 4 cycles Dose modification: 40 mg/m2 (80 % of original dose 50 mg/m2, Cycle 1, Reason: Dose Not Tolerated) Administration: 84 mg (07/20/2019), 84 mg (08/10/2019), 83 mg (09/08/2019) gemcitabine (GEMZAR) 1,600 mg in sodium chloride 0.9 % 250 mL chemo infusion, 1,672 mg (80 % of original dose 1,000 mg/m2), Intravenous,  Once, 3 of 4 cycles Dose modification: 800 mg/m2 (80 % of original dose 1,000 mg/m2, Cycle 1, Reason: Provider Judgment) Administration: 1,600 mg (07/20/2019), 1,600 mg (07/27/2019), 1,600 mg (08/10/2019), 1,600 mg  (08/17/2019), 1,600 mg (09/08/2019), 1,672 mg (09/15/2019) ondansetron (ZOFRAN) 8 mg, dexamethasone (DECADRON) 10 mg in sodium chloride 0.9 % 50 mL IVPB, , Intravenous,  Once, 3 of 4 cycles Administration:  (09/15/2019) fosaprepitant (EMEND) 150 mg, dexamethasone (DECADRON) 12 mg in sodium chloride 0.9 % 145 mL IVPB, , Intravenous,  Once, 3 of 4 cycles Administration:  (07/20/2019),  (08/10/2019),  (09/08/2019)  for chemotherapy treatment.    10/02/2019 Imaging   CT neck As compared to 05/30/2019, no significant interval change in size of an ill-defined mass within the right lateral neck, again measuring 3.3 x 1.8 cm in transaxial dimensions.   Unchanged mildly enlarged left level I lymph node measuring 1.1 cm in short axis.   Unchanged node or nodule at the thoracic inlet, measuring 1.3 x 0.8 cm.   Please refer to concurrently performed chest CT for a description of findings below the level of the thoracic inlet.     10/02/2019 Imaging   CT chest 1. No new or progressive findings in the chest to suggest metastatic disease. 2. Bilateral subcentimeter solid pulmonary nodules are stable since 2018. 3. Hyperdense 1.1 cm anterior liver focus, not clearly visualized on prior studies. Suggest MRI abdomen without and with IV contrast for further characterization.   10/20/2019 -  Chemotherapy   The patient had gemcitabine (GEMZAR) 2,000 mg in sodium chloride 0.9 % 250 mL chemo infusion, 2,090 mg, Intravenous,  Once, 1 of 6 cycles Administration: 2,000 mg (10/20/2019)  for chemotherapy treatment.      REVIEW OF SYSTEMS:   Constitutional: Denies fevers, chills or abnormal weight loss Eyes: Denies blurriness of vision Ears, nose, mouth, throat, and face: Denies mucositis or sore throat Respiratory: Denies cough, dyspnea or wheezes Cardiovascular: Denies palpitation, chest discomfort or lower extremity swelling Skin: Denies abnormal skin rashes Lymphatics: Denies new lymphadenopathy or easy  bruising Neurological:Denies numbness, tingling or new weaknesses Behavioral/Psych: Mood is stable, no new changes  All other systems were reviewed with the patient and are negative.  I have reviewed the past medical history, past surgical history, social history and family history with the patient and they are unchanged from previous note.  ALLERGIES:  is allergic to phenergan [promethazine hcl]; heparin; and clindamycin.  MEDICATIONS:  Current Outpatient Medications  Medication Sig Dispense Refill  . atorvastatin (LIPITOR) 40 MG tablet TK 1 T PO  D    . cetirizine (ZYRTEC) 10 MG tablet Take 10 mg by mouth daily.    Marland Kitchen ibuprofen (ADVIL) 600 MG tablet TK 1 T PO Q 6 H PRN    . levothyroxine (SYNTHROID) 137 MCG tablet Take 1 tablet (137 mcg total) by mouth daily before breakfast. 90 tablet 9  . lidocaine (XYLOCAINE) 2 % solution Use as directed 5 mLs in the mouth or throat every 3 (three) hours as needed for mouth pain. Swish, gargle and spit 100 mL 2  . LORazepam (ATIVAN) 1 MG tablet Take 1 tablet (1 mg total) by mouth every 8 (eight) hours  as needed for anxiety. 30 tablet 3  . omeprazole (PRILOSEC) 20 MG capsule Take 1 capsule (20 mg total) by mouth daily. 30 capsule 5  . ondansetron (ZOFRAN) 8 MG tablet Take 1 tablet (8 mg total) by mouth every 8 (eight) hours as needed for nausea or vomiting. 90 tablet 1  . Oxycodone HCl 10 MG TABS Take 1 tablet (10 mg total) by mouth every 8 (eight) hours as needed. 60 tablet 0  . PARoxetine (PAXIL) 20 MG tablet Take 1 tablet (20 mg total) by mouth daily. 30 tablet 5  . polyethylene glycol (MIRALAX) packet Take 17 g by mouth daily. (Patient taking differently: Take 17 g by mouth daily as needed for mild constipation or moderate constipation. ) 14 each 3  . prochlorperazine (COMPAZINE) 10 MG tablet Take 1 tablet (10 mg total) by mouth every 12 (twelve) hours as needed (Nausea or vomiting). 30 tablet 1  . senna-docusate (SENOKOT-S) 8.6-50 MG tablet Take 2  tablets by mouth 3 (three) times daily. 90 tablet 0  . Vitamin D, Ergocalciferol, (DRISDOL) 1.25 MG (50000 UT) CAPS capsule TK 1 C PO WEEKLY     No current facility-administered medications for this visit.   Facility-Administered Medications Ordered in Other Visits  Medication Dose Route Frequency Provider Last Rate Last Admin  . anticoagulant sodium citrate solution 5 mL  5 mL Intracatheter Once Alvy Bimler, Berish Bohman, MD      . anticoagulant sodium citrate solution 5 mL  5 mL Intracatheter Once Alvy Bimler, Kawhi Diebold, MD      . gemcitabine (GEMZAR) 2,000 mg in sodium chloride 0.9 % 250 mL chemo infusion  2,000 mg Intravenous Once Alvy Bimler, Krishon Adkison, MD      . heparin lock flush 100 unit/mL  500 Units Intracatheter Once PRN Alvy Bimler, Marius Betts, MD      . sodium chloride flush (NS) 0.9 % injection 10 mL  10 mL Intracatheter PRN Alvy Bimler, Ariyonna Twichell, MD        PHYSICAL EXAMINATION: ECOG PERFORMANCE STATUS: 1 - Symptomatic but completely ambulatory  Vitals:   11/03/19 1227  BP: 134/81  Pulse: 76  Resp: 18  Temp: 99.1 F (37.3 C)  SpO2: 100%   Filed Weights   11/03/19 1227  Weight: 197 lb (89.4 kg)    GENERAL:alert, no distress and comfortable SKIN: skin color, texture, turgor are normal, no rashes or significant lesions EYES: normal, Conjunctiva are pink and non-injected, sclera clear OROPHARYNX:no exudate, no erythema and lips, buccal mucosa, and tongue normal  NECK: The subcutaneous nodule on the right side of his neck is unchanged.  Fibrosis is noted on his neck LYMPH:  no palpable lymphadenopathy in the cervical, axillary or inguinal LUNGS: clear to auscultation and percussion with normal breathing effort HEART: regular rate & rhythm and no murmurs and no lower extremity edema ABDOMEN:abdomen soft, non-tender and normal bowel sounds Musculoskeletal:no cyanosis of digits and no clubbing  NEURO: alert & oriented x 3 with fluent speech, no focal motor/sensory deficits  LABORATORY DATA:  I have reviewed the data as  listed    Component Value Date/Time   NA 137 11/03/2019 1205   NA 139 09/22/2017 0829   K 3.7 11/03/2019 1205   K 3.5 09/22/2017 0829   CL 103 11/03/2019 1205   CO2 28 11/03/2019 1205   CO2 26 09/22/2017 0829   GLUCOSE 127 (H) 11/03/2019 1205   GLUCOSE 133 09/22/2017 0829   BUN 12 11/03/2019 1205   BUN 14.1 09/22/2017 0829   CREATININE 0.94 11/03/2019 1205  CREATININE 0.93 02/06/2019 1215   CREATININE 0.9 09/22/2017 0829   CALCIUM 9.0 11/03/2019 1205   CALCIUM 9.1 09/22/2017 0829   PROT 7.0 11/03/2019 1205   PROT 6.8 09/22/2017 0829   ALBUMIN 4.3 11/03/2019 1205   ALBUMIN 4.1 09/22/2017 0829   AST 39 11/03/2019 1205   AST 31 02/06/2019 1215   AST 22 09/22/2017 0829   ALT 62 (H) 11/03/2019 1205   ALT 48 (H) 02/06/2019 1215   ALT 30 09/22/2017 0829   ALKPHOS 63 11/03/2019 1205   ALKPHOS 55 09/22/2017 0829   BILITOT 0.3 11/03/2019 1205   BILITOT 0.2 (L) 02/06/2019 1215   BILITOT 0.35 09/22/2017 0829   GFRNONAA >60 11/03/2019 1205   GFRNONAA >60 02/06/2019 1215   GFRAA >60 11/03/2019 1205   GFRAA >60 02/06/2019 1215    No results found for: SPEP, UPEP  Lab Results  Component Value Date   WBC 5.7 11/03/2019   NEUTROABS 3.5 11/03/2019   HGB 12.9 (L) 11/03/2019   HCT 39.6 11/03/2019   MCV 92.7 11/03/2019   PLT 348 11/03/2019      Chemistry      Component Value Date/Time   NA 137 11/03/2019 1205   NA 139 09/22/2017 0829   K 3.7 11/03/2019 1205   K 3.5 09/22/2017 0829   CL 103 11/03/2019 1205   CO2 28 11/03/2019 1205   CO2 26 09/22/2017 0829   BUN 12 11/03/2019 1205   BUN 14.1 09/22/2017 0829   CREATININE 0.94 11/03/2019 1205   CREATININE 0.93 02/06/2019 1215   CREATININE 0.9 09/22/2017 0829      Component Value Date/Time   CALCIUM 9.0 11/03/2019 1205   CALCIUM 9.1 09/22/2017 0829   ALKPHOS 63 11/03/2019 1205   ALKPHOS 55 09/22/2017 0829   AST 39 11/03/2019 1205   AST 31 02/06/2019 1215   AST 22 09/22/2017 0829   ALT 62 (H) 11/03/2019 1205   ALT 48  (H) 02/06/2019 1215   ALT 30 09/22/2017 0829   BILITOT 0.3 11/03/2019 1205   BILITOT 0.2 (L) 02/06/2019 1215   BILITOT 0.35 09/22/2017 6861

## 2019-11-04 ENCOUNTER — Inpatient Hospital Stay: Payer: Medicaid Other

## 2019-11-04 ENCOUNTER — Other Ambulatory Visit: Payer: Self-pay

## 2019-11-04 VITALS — BP 118/62 | HR 78 | Temp 98.7°F | Resp 16

## 2019-11-04 DIAGNOSIS — C7800 Secondary malignant neoplasm of unspecified lung: Secondary | ICD-10-CM

## 2019-11-04 DIAGNOSIS — C760 Malignant neoplasm of head, face and neck: Secondary | ICD-10-CM

## 2019-11-04 DIAGNOSIS — Z5111 Encounter for antineoplastic chemotherapy: Secondary | ICD-10-CM | POA: Diagnosis not present

## 2019-11-04 DIAGNOSIS — Z95828 Presence of other vascular implants and grafts: Secondary | ICD-10-CM

## 2019-11-04 MED ORDER — ONDANSETRON HCL 4 MG/2ML IJ SOLN
INTRAMUSCULAR | Status: AC
  Filled 2019-11-04: qty 4

## 2019-11-04 MED ORDER — SODIUM CHLORIDE 0.9% FLUSH
3.0000 mL | Freq: Once | INTRAVENOUS | Status: DC | PRN
  Filled 2019-11-04: qty 10

## 2019-11-04 MED ORDER — SODIUM CHLORIDE 0.9 % IV SOLN
Freq: Once | INTRAVENOUS | Status: AC
  Filled 2019-11-04: qty 250

## 2019-11-04 MED ORDER — HEPARIN SOD (PORK) LOCK FLUSH 100 UNIT/ML IV SOLN
500.0000 [IU] | Freq: Once | INTRAVENOUS | Status: DC | PRN
  Filled 2019-11-04: qty 5

## 2019-11-04 MED ORDER — ALTEPLASE 2 MG IJ SOLR
2.0000 mg | Freq: Once | INTRAMUSCULAR | Status: DC | PRN
  Filled 2019-11-04: qty 2

## 2019-11-04 MED ORDER — ONDANSETRON HCL 4 MG/2ML IJ SOLN
8.0000 mg | Freq: Once | INTRAMUSCULAR | Status: AC
  Administered 2019-11-04: 11:00:00 8 mg via INTRAVENOUS

## 2019-11-04 MED ORDER — HEPARIN SOD (PORK) LOCK FLUSH 100 UNIT/ML IV SOLN
250.0000 [IU] | Freq: Once | INTRAVENOUS | Status: DC | PRN
  Filled 2019-11-04: qty 5

## 2019-11-04 MED ORDER — SODIUM CHLORIDE 0.9% FLUSH
10.0000 mL | Freq: Once | INTRAVENOUS | Status: DC | PRN
  Filled 2019-11-04: qty 10

## 2019-11-04 MED ORDER — ANTICOAGULANT SODIUM CITRATE 4% (200MG/5ML) IV SOLN
5.0000 mL | Freq: Once | Status: AC
  Administered 2019-11-04: 13:00:00 5 mL

## 2019-11-04 MED ORDER — SODIUM CHLORIDE 0.9% FLUSH
10.0000 mL | Freq: Once | INTRAVENOUS | Status: AC
  Administered 2019-11-04: 10 mL
  Filled 2019-11-04: qty 10

## 2019-11-04 NOTE — Patient Instructions (Signed)

## 2019-11-07 ENCOUNTER — Encounter: Payer: Self-pay | Admitting: Pharmacist

## 2019-11-07 NOTE — Addendum Note (Signed)
Addended by: Delane Ginger on: 11/07/2019 05:11 PM   Modules accepted: Orders

## 2019-11-17 ENCOUNTER — Other Ambulatory Visit: Payer: Self-pay

## 2019-11-17 ENCOUNTER — Other Ambulatory Visit: Payer: Medicaid Other

## 2019-11-17 ENCOUNTER — Inpatient Hospital Stay: Payer: Medicaid Other | Attending: Hematology and Oncology

## 2019-11-17 ENCOUNTER — Inpatient Hospital Stay: Payer: Medicaid Other

## 2019-11-17 ENCOUNTER — Ambulatory Visit: Payer: Medicaid Other

## 2019-11-17 VITALS — BP 123/72 | HR 69 | Temp 97.8°F | Resp 17 | Wt 193.5 lb

## 2019-11-17 DIAGNOSIS — C7800 Secondary malignant neoplasm of unspecified lung: Secondary | ICD-10-CM

## 2019-11-17 DIAGNOSIS — C760 Malignant neoplasm of head, face and neck: Secondary | ICD-10-CM

## 2019-11-17 DIAGNOSIS — Z95828 Presence of other vascular implants and grafts: Secondary | ICD-10-CM

## 2019-11-17 DIAGNOSIS — Z5111 Encounter for antineoplastic chemotherapy: Secondary | ICD-10-CM | POA: Diagnosis present

## 2019-11-17 DIAGNOSIS — R112 Nausea with vomiting, unspecified: Secondary | ICD-10-CM | POA: Diagnosis not present

## 2019-11-17 DIAGNOSIS — R5383 Other fatigue: Secondary | ICD-10-CM | POA: Diagnosis not present

## 2019-11-17 DIAGNOSIS — Z7189 Other specified counseling: Secondary | ICD-10-CM

## 2019-11-17 DIAGNOSIS — C119 Malignant neoplasm of nasopharynx, unspecified: Secondary | ICD-10-CM | POA: Diagnosis present

## 2019-11-17 LAB — COMPREHENSIVE METABOLIC PANEL
ALT: 90 U/L — ABNORMAL HIGH (ref 0–44)
AST: 52 U/L — ABNORMAL HIGH (ref 15–41)
Albumin: 4.2 g/dL (ref 3.5–5.0)
Alkaline Phosphatase: 65 U/L (ref 38–126)
Anion gap: 10 (ref 5–15)
BUN: 13 mg/dL (ref 6–20)
CO2: 28 mmol/L (ref 22–32)
Calcium: 9.2 mg/dL (ref 8.9–10.3)
Chloride: 104 mmol/L (ref 98–111)
Creatinine, Ser: 0.88 mg/dL (ref 0.61–1.24)
GFR calc Af Amer: >60 mL/min (ref >60)
GFR calc non Af Amer: >60 mL/min (ref >60)
Glucose, Bld: 145 mg/dL — ABNORMAL HIGH (ref 70–99)
Potassium: 3.5 mmol/L (ref 3.5–5.1)
Sodium: 142 mmol/L (ref 135–145)
Total Bilirubin: 0.2 mg/dL — ABNORMAL LOW (ref 0.3–1.2)
Total Protein: 7 g/dL (ref 6.5–8.1)

## 2019-11-17 LAB — CBC WITH DIFFERENTIAL/PLATELET
Abs Immature Granulocytes: 0.01 10*3/uL (ref 0.00–0.07)
Basophils Absolute: 0 10*3/uL (ref 0.0–0.1)
Basophils Relative: 0 %
Eosinophils Absolute: 0.1 10*3/uL (ref 0.0–0.5)
Eosinophils Relative: 2 %
HCT: 39.7 % (ref 39.0–52.0)
Hemoglobin: 13.1 g/dL (ref 13.0–17.0)
Immature Granulocytes: 0 %
Lymphocytes Relative: 24 %
Lymphs Abs: 1.1 10*3/uL (ref 0.7–4.0)
MCH: 30.3 pg (ref 26.0–34.0)
MCHC: 33 g/dL (ref 30.0–36.0)
MCV: 91.9 fL (ref 80.0–100.0)
Monocytes Absolute: 0.6 10*3/uL (ref 0.1–1.0)
Monocytes Relative: 14 %
Neutro Abs: 2.8 10*3/uL (ref 1.7–7.7)
Neutrophils Relative %: 60 %
Platelets: 129 10*3/uL — ABNORMAL LOW (ref 150–400)
RBC: 4.32 MIL/uL (ref 4.22–5.81)
RDW: 15.9 % — ABNORMAL HIGH (ref 11.5–15.5)
WBC: 4.6 10*3/uL (ref 4.0–10.5)
nRBC: 0 % (ref 0.0–0.2)

## 2019-11-17 MED ORDER — SODIUM CHLORIDE 0.9% FLUSH
10.0000 mL | INTRAVENOUS | Status: DC | PRN
  Administered 2019-11-17: 10 mL
  Filled 2019-11-17: qty 10

## 2019-11-17 MED ORDER — SODIUM CHLORIDE 0.9% FLUSH
10.0000 mL | Freq: Once | INTRAVENOUS | Status: AC
  Administered 2019-11-17: 10 mL
  Filled 2019-11-17: qty 10

## 2019-11-17 MED ORDER — SODIUM CHLORIDE 0.9 % IV SOLN
Freq: Once | INTRAVENOUS | Status: AC
  Filled 2019-11-17: qty 250

## 2019-11-17 MED ORDER — ANTICOAGULANT SODIUM CITRATE 4% (200MG/5ML) IV SOLN
5.0000 mL | Freq: Once | Status: AC
  Administered 2019-11-18: 5 mL
  Filled 2019-11-17: qty 5

## 2019-11-17 MED ORDER — ONDANSETRON HCL 4 MG/2ML IJ SOLN
8.0000 mg | Freq: Once | INTRAMUSCULAR | Status: AC
  Administered 2019-11-17: 8 mg via INTRAVENOUS

## 2019-11-17 MED ORDER — SODIUM CHLORIDE 0.9 % IV SOLN
2000.0000 mg | Freq: Once | INTRAVENOUS | Status: AC
  Administered 2019-11-17: 2000 mg via INTRAVENOUS
  Filled 2019-11-17: qty 52.6

## 2019-11-17 MED ORDER — ANTICOAGULANT SODIUM CITRATE 4% (200MG/5ML) IV SOLN
5.0000 mL | Freq: Once | Status: AC
  Administered 2019-11-17: 5 mL
  Filled 2019-11-17: qty 5

## 2019-11-17 MED ORDER — ONDANSETRON HCL 4 MG/2ML IJ SOLN
INTRAMUSCULAR | Status: AC
  Filled 2019-11-17: qty 4

## 2019-11-17 NOTE — Patient Instructions (Signed)

## 2019-11-17 NOTE — Progress Notes (Signed)
Per Dr. Jana Hakim, okay to treat with ALT 90.   Per Sandi Mealy, okay to give patient 500cc bolus of NaCl while receiving chemo. However, patient was unable to tolderate rate and received 250cc instead.

## 2019-11-17 NOTE — Patient Instructions (Signed)
[facility] °Discharge Instructions for Patients Receiving Chemotherapy ° °Today you received the following chemotherapy agents Gemzar ° °To help prevent nausea and vomiting after your treatment, we encourage you to take your nausea medication as directed. °  °If you develop nausea and vomiting that is not controlled by your nausea medication, call the clinic.  ° °BELOW ARE SYMPTOMS THAT SHOULD BE REPORTED IMMEDIATELY: °· *FEVER GREATER THAN 100.5 F °· *CHILLS WITH OR WITHOUT FEVER °· NAUSEA AND VOMITING THAT IS NOT CONTROLLED WITH YOUR NAUSEA MEDICATION °· *UNUSUAL SHORTNESS OF BREATH °· *UNUSUAL BRUISING OR BLEEDING °· TENDERNESS IN MOUTH AND THROAT WITH OR WITHOUT PRESENCE OF ULCERS °· *URINARY PROBLEMS °· *BOWEL PROBLEMS °· UNUSUAL RASH °Items with * indicate a potential emergency and should be followed up as soon as possible. ° °Feel free to call the clinic should you have any questions or concerns. The clinic phone number is [phone]. ° °Please show the CHEMO ALERT CARD at check-in to the Emergency Department and triage nurse. ° ° °

## 2019-11-18 ENCOUNTER — Inpatient Hospital Stay: Payer: Medicaid Other

## 2019-11-18 VITALS — BP 132/73 | HR 80 | Temp 98.9°F | Resp 18 | Ht 69.0 in

## 2019-11-18 DIAGNOSIS — C7800 Secondary malignant neoplasm of unspecified lung: Secondary | ICD-10-CM

## 2019-11-18 DIAGNOSIS — Z95828 Presence of other vascular implants and grafts: Secondary | ICD-10-CM

## 2019-11-18 DIAGNOSIS — Z5111 Encounter for antineoplastic chemotherapy: Secondary | ICD-10-CM | POA: Diagnosis not present

## 2019-11-18 DIAGNOSIS — C760 Malignant neoplasm of head, face and neck: Secondary | ICD-10-CM

## 2019-11-18 MED ORDER — SODIUM CHLORIDE 0.9 % IV SOLN
Freq: Once | INTRAVENOUS | Status: AC
  Filled 2019-11-18: qty 250

## 2019-11-18 MED ORDER — ONDANSETRON HCL 4 MG/2ML IJ SOLN
INTRAMUSCULAR | Status: AC
  Filled 2019-11-18: qty 4

## 2019-11-18 MED ORDER — ONDANSETRON HCL 4 MG/2ML IJ SOLN
8.0000 mg | Freq: Once | INTRAMUSCULAR | Status: AC
  Administered 2019-11-18: 8 mg via INTRAVENOUS

## 2019-11-18 MED ORDER — ANTICOAGULANT SODIUM CITRATE 4% (200MG/5ML) IV SOLN: 5.0000 mL | Freq: Once | Status: DC

## 2019-11-18 MED ORDER — SODIUM CHLORIDE 0.9% FLUSH
10.0000 mL | Freq: Once | INTRAVENOUS | Status: AC
  Administered 2019-11-18: 10 mL
  Filled 2019-11-18: qty 10

## 2019-11-18 NOTE — Patient Instructions (Signed)

## 2019-11-24 ENCOUNTER — Telehealth: Payer: Self-pay | Admitting: *Deleted

## 2019-11-24 ENCOUNTER — Other Ambulatory Visit: Payer: Self-pay | Admitting: Hematology and Oncology

## 2019-11-24 MED ORDER — OXYCODONE HCL 10 MG PO TABS: 10.0000 mg | ORAL_TABLET | Freq: Three times a day (TID) | ORAL | 0 refills | Status: DC | PRN

## 2019-11-24 MED ORDER — CETIRIZINE HCL 10 MG PO TABS: 10.0000 mg | ORAL_TABLET | Freq: Every day | ORAL | 1 refills | Status: DC

## 2019-11-24 NOTE — Telephone Encounter (Signed)
Patient notified refills have been sent

## 2019-11-24 NOTE — Telephone Encounter (Signed)
Patient called for a refill of certirizine and oxycodone. Please send to [facility] on [facility].

## 2019-11-24 NOTE — Telephone Encounter (Signed)
done

## 2019-12-01 ENCOUNTER — Inpatient Hospital Stay: Payer: Medicaid Other

## 2019-12-01 ENCOUNTER — Other Ambulatory Visit: Payer: Medicaid Other

## 2019-12-01 ENCOUNTER — Ambulatory Visit: Payer: Medicaid Other

## 2019-12-01 ENCOUNTER — Telehealth: Payer: Self-pay

## 2019-12-01 NOTE — Telephone Encounter (Signed)
Just take care of his wife I will see him in 2 weeks

## 2019-12-01 NOTE — Telephone Encounter (Signed)
He called and left a message to call him. He needs to cancel today's appts.  Called back. His wife is sick. She has vomited all night and he has no sitter for the kids. Appts canceled. He would like to keep appts in 2 weeks. If Dr. Alvy Bimler thinks he needs to reschedule to a earlier appt he is willing to come next week. He is worried about missing the appts.

## 2019-12-01 NOTE — Telephone Encounter (Signed)
Called and given below message. He verbalized understanding. 

## 2019-12-08 ENCOUNTER — Other Ambulatory Visit: Payer: Self-pay | Admitting: Hematology and Oncology

## 2019-12-13 ENCOUNTER — Other Ambulatory Visit: Payer: Self-pay

## 2019-12-14 ENCOUNTER — Other Ambulatory Visit: Payer: Self-pay | Admitting: Hematology and Oncology

## 2019-12-14 MED ORDER — OXYCODONE HCL 10 MG PO TABS: 10.0000 mg | ORAL_TABLET | Freq: Three times a day (TID) | ORAL | 0 refills | Status: DC | PRN

## 2019-12-15 ENCOUNTER — Other Ambulatory Visit: Payer: Medicaid Other

## 2019-12-15 ENCOUNTER — Inpatient Hospital Stay: Payer: Medicaid Other

## 2019-12-15 ENCOUNTER — Inpatient Hospital Stay (HOSPITAL_BASED_OUTPATIENT_CLINIC_OR_DEPARTMENT_OTHER): Payer: Medicaid Other | Admitting: Hematology and Oncology

## 2019-12-15 ENCOUNTER — Ambulatory Visit: Payer: Medicaid Other | Admitting: Hematology and Oncology

## 2019-12-15 ENCOUNTER — Encounter: Payer: Self-pay | Admitting: Hematology and Oncology

## 2019-12-15 ENCOUNTER — Inpatient Hospital Stay: Payer: Medicaid Other | Attending: Hematology and Oncology

## 2019-12-15 ENCOUNTER — Ambulatory Visit: Payer: Medicaid Other

## 2019-12-15 ENCOUNTER — Telehealth: Payer: Self-pay | Admitting: Hematology and Oncology

## 2019-12-15 ENCOUNTER — Other Ambulatory Visit: Payer: Self-pay

## 2019-12-15 ENCOUNTER — Other Ambulatory Visit: Payer: Self-pay | Admitting: Hematology and Oncology

## 2019-12-15 DIAGNOSIS — Z79899 Other long term (current) drug therapy: Secondary | ICD-10-CM | POA: Diagnosis not present

## 2019-12-15 DIAGNOSIS — G893 Neoplasm related pain (acute) (chronic): Secondary | ICD-10-CM

## 2019-12-15 DIAGNOSIS — Z7189 Other specified counseling: Secondary | ICD-10-CM

## 2019-12-15 DIAGNOSIS — R112 Nausea with vomiting, unspecified: Secondary | ICD-10-CM | POA: Insufficient documentation

## 2019-12-15 DIAGNOSIS — C119 Malignant neoplasm of nasopharynx, unspecified: Secondary | ICD-10-CM | POA: Diagnosis present

## 2019-12-15 DIAGNOSIS — Z95828 Presence of other vascular implants and grafts: Secondary | ICD-10-CM

## 2019-12-15 DIAGNOSIS — C760 Malignant neoplasm of head, face and neck: Secondary | ICD-10-CM

## 2019-12-15 DIAGNOSIS — C7800 Secondary malignant neoplasm of unspecified lung: Secondary | ICD-10-CM

## 2019-12-15 DIAGNOSIS — Z5111 Encounter for antineoplastic chemotherapy: Secondary | ICD-10-CM | POA: Insufficient documentation

## 2019-12-15 DIAGNOSIS — K5909 Other constipation: Secondary | ICD-10-CM

## 2019-12-15 LAB — CBC WITH DIFFERENTIAL/PLATELET
Abs Immature Granulocytes: 0.01 10*3/uL (ref 0.00–0.07)
Basophils Absolute: 0 10*3/uL (ref 0.0–0.1)
Basophils Relative: 1 %
Eosinophils Absolute: 0.1 10*3/uL (ref 0.0–0.5)
Eosinophils Relative: 3 %
HCT: 41.7 % (ref 39.0–52.0)
Hemoglobin: 13.7 g/dL (ref 13.0–17.0)
Immature Granulocytes: 0 %
Lymphocytes Relative: 25 %
Lymphs Abs: 1.3 10*3/uL (ref 0.7–4.0)
MCH: 29.8 pg (ref 26.0–34.0)
MCHC: 32.9 g/dL (ref 30.0–36.0)
MCV: 90.8 fL (ref 80.0–100.0)
Monocytes Absolute: 0.7 10*3/uL (ref 0.1–1.0)
Monocytes Relative: 13 %
Neutro Abs: 3.1 10*3/uL (ref 1.7–7.7)
Neutrophils Relative %: 58 %
Platelets: 183 10*3/uL (ref 150–400)
RBC: 4.59 MIL/uL (ref 4.22–5.81)
RDW: 14.2 % (ref 11.5–15.5)
WBC: 5.3 10*3/uL (ref 4.0–10.5)
nRBC: 0 % (ref 0.0–0.2)

## 2019-12-15 LAB — COMPREHENSIVE METABOLIC PANEL
ALT: 33 U/L (ref 0–44)
AST: 26 U/L (ref 15–41)
Albumin: 4.2 g/dL (ref 3.5–5.0)
Alkaline Phosphatase: 72 U/L (ref 38–126)
Anion gap: 10 (ref 5–15)
BUN: 12 mg/dL (ref 6–20)
CO2: 25 mmol/L (ref 22–32)
Calcium: 9.2 mg/dL (ref 8.9–10.3)
Chloride: 104 mmol/L (ref 98–111)
Creatinine, Ser: 0.9 mg/dL (ref 0.61–1.24)
GFR calc Af Amer: >60 mL/min (ref >60)
GFR calc non Af Amer: >60 mL/min (ref >60)
Glucose, Bld: 106 mg/dL — ABNORMAL HIGH (ref 70–99)
Potassium: 3.3 mmol/L — ABNORMAL LOW (ref 3.5–5.1)
Sodium: 139 mmol/L (ref 135–145)
Total Bilirubin: 0.3 mg/dL (ref 0.3–1.2)
Total Protein: 7.1 g/dL (ref 6.5–8.1)

## 2019-12-15 MED ORDER — ONDANSETRON HCL 4 MG/2ML IJ SOLN
INTRAMUSCULAR | Status: AC
  Filled 2019-12-15: qty 4

## 2019-12-15 MED ORDER — ANTICOAGULANT SODIUM CITRATE 4% (200MG/5ML) IV SOLN
5.0000 mL | Freq: Once | Status: AC
  Administered 2019-12-15: 5 mL
  Filled 2019-12-15: qty 5

## 2019-12-15 MED ORDER — ANTICOAGULANT SODIUM CITRATE 4% (200MG/5ML) IV SOLN
5.0000 mL | Freq: Once | Status: AC
  Administered 2019-12-16: 5 mL
  Filled 2019-12-15: qty 5

## 2019-12-15 MED ORDER — OMEPRAZOLE 20 MG PO CPDR: 20.0000 mg | DELAYED_RELEASE_CAPSULE | Freq: Every day | ORAL | 11 refills | Status: DC

## 2019-12-15 MED ORDER — SODIUM CHLORIDE 0.9 % IV SOLN
2000.0000 mg | Freq: Once | INTRAVENOUS | Status: AC
  Administered 2019-12-15: 2000 mg via INTRAVENOUS
  Filled 2019-12-15: qty 52.6

## 2019-12-15 MED ORDER — SODIUM CHLORIDE 0.9 % IV SOLN
10.0000 mg | Freq: Once | INTRAVENOUS | Status: AC
  Administered 2019-12-15: 10 mg via INTRAVENOUS
  Filled 2019-12-15: qty 1

## 2019-12-15 MED ORDER — SODIUM CHLORIDE 0.9 % IV SOLN
Freq: Once | INTRAVENOUS | Status: AC
  Filled 2019-12-15: qty 250

## 2019-12-15 MED ORDER — SODIUM CHLORIDE 0.9% FLUSH
10.0000 mL | INTRAVENOUS | Status: DC | PRN
  Administered 2019-12-15: 10 mL
  Filled 2019-12-15: qty 10

## 2019-12-15 MED ORDER — ONDANSETRON HCL 4 MG/2ML IJ SOLN
8.0000 mg | Freq: Once | INTRAMUSCULAR | Status: AC
  Administered 2019-12-15: 8 mg via INTRAVENOUS

## 2019-12-15 NOTE — Assessment & Plan Note (Signed)
So far, he tolerated gemcitabine well without major side effects apart from some mild nausea The skin nodule on the right side of his neck is stable I recommend minimum 3 months of treatment before repeat imaging study, due end of April Per previous discussion, we will do treatment every other week

## 2019-12-15 NOTE — Patient Instructions (Signed)
[facility] Discharge Instructions for Patients Receiving Chemotherapy  Today you received the following chemotherapy agents: gemcitabine.  To help prevent nausea and vomiting after your treatment, we encourage you to take your nausea medication as directed.   If you develop nausea and vomiting that is not controlled by your nausea medication, call the clinic.   BELOW ARE SYMPTOMS THAT SHOULD BE REPORTED IMMEDIATELY:  *FEVER GREATER THAN 100.5 F  *CHILLS WITH OR WITHOUT FEVER  NAUSEA AND VOMITING THAT IS NOT CONTROLLED WITH YOUR NAUSEA MEDICATION  *UNUSUAL SHORTNESS OF BREATH  *UNUSUAL BRUISING OR BLEEDING  TENDERNESS IN MOUTH AND THROAT WITH OR WITHOUT PRESENCE OF ULCERS  *URINARY PROBLEMS  *BOWEL PROBLEMS  UNUSUAL RASH Items with * indicate a potential emergency and should be followed up as soon as possible.  Feel free to call the clinic should you have any questions or concerns. The clinic phone number is [phone].  Please show the CHEMO ALERT CARD at check-in to the Emergency Department and triage nurse.   

## 2019-12-15 NOTE — Telephone Encounter (Signed)
Scheduled appt per 3/12 sch message - pt to get an updated schedule after tx today '

## 2019-12-15 NOTE — Assessment & Plan Note (Signed)
He has chronic constipation and is not consistent taking laxative I think the constipation is contributing to nausea We discussed the importance of him taking laxatives regularly

## 2019-12-15 NOTE — Assessment & Plan Note (Signed)
He has poorly controlled nausea He is also constipated We will continue antiemetics and I reminded the patient importance of taking regular laxatives to avoid constipation Per patient request, I will order IV fluids tomorrow to help with his nausea

## 2019-12-15 NOTE — Assessment & Plan Note (Signed)
He has stable pain control He will continue oxycodone at low-dose

## 2019-12-15 NOTE — Progress Notes (Signed)
[place] OFFICE PROGRESS NOTE  Patient Care Team: Patient, No Pcp Per as PCP - General (General Practice) [name], MD as Consulting Physician (Hematology and Oncology) Eppie Gibson, MD as Attending Physician (Radiation Oncology) Jodi Marble, MD as Consulting Physician (Otolaryngology) Philomena Doheny, MD as Referring Physician (Plastic Surgery) Irene Shipper, MD as Consulting Physician (Gastroenterology) System, Provider Not In  ASSESSMENT & PLAN:  Primary cancer of head and neck [facility]) So far, he tolerated gemcitabine well without major side effects apart from some mild nausea The skin nodule on the right side of his neck is stable I recommend minimum 3 months of treatment before repeat imaging study, due end of April Per previous discussion, we will do treatment every other week  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   All questions were answered. The patient knows to call the clinic with any problems, questions or concerns. The total time spent in the appointment was 20 minutes encounter with patients including review of chart and various tests results, discussions about plan of care and coordination of care plan   [name], MD 12/15/2019 11:41 AM  INTERVAL HISTORY: Please see below for problem oriented charting. 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 Pan-endoscopy elsewhere showed cancer from nasopharyngeal space.   01/04/2007 - 02/20/2007 Chemotherapy   He received 2 cycles of cisplatin and 5FU followed by concurrent chemo with weekly cisplatin and radiation. He only received 2 doses of chemo due to severe mucositis, nausea and weight loss.   04/05/2007 - 08/04/2007 Chemotherapy   He received 4 more courses of cisplatin with 5FU and had complete response   07/05/2009 Procedure   Fine-needle aspirate of the right level II lymph nodes come from recurrent metastatic disease. Repeat endoscopy and CT scan show no evidence of disease elsewhere.   07/08/2009 - 12/02/2009 Chemotherapy   He was given 6 cycles of carboplatin, 5-FU and docetaxel   12/03/2009 Surgery   He has surgery to the residual lymph node on the right neck which showed no evidence of  disease.   02/22/2012 Imaging   Repeat imaging study showed large recurrent mass. He was referred elsewhere for further treatment.   05/03/2012 Surgery   He underwent left upper lobectomy.   04/29/2013 Imaging   PEt scan showed lesion on right level II B and lower lung was  abnormal   06/03/2013 - 02/02/2014 Chemotherapy   He had 6 cycles of chemotherapy when he was found to have recurrence of cancer and had received oxaliplatin and capecitabine   06/07/2014 Imaging   PET CT scan showed persistent disease in the right neck lymph nodes and left lung   06/29/2014 Procedure   Accession: [id number] repeat LUL biopsy confirmed metastatic cancer   07/18/2014 - 07/31/2014 Radiation Therapy   He received palliative radiation therapy to the lungs   10/10/2014 Imaging   CT scan of the chest, abdomen and pelvis show regression in the size of the lung nodule in the left upper lobe and stable pulmonary nodules   01/24/2015 Imaging   CT scan showed stable disease in neck and lung   06/19/2015 Imaging   CT scan of the neck and the chest show possible mild progression of the nodule in the right side of the neck.   06/25/2015 Imaging   PET scan confirmed disease recurrence in the neck   07/07/2015 Imaging   He had MRI neck at [facility]   09/03/2015 - 08/26/2018 Chemotherapy   He received palliative chemo with Nivolumab   10/29/2015 Imaging   PET CT showed positive response to Rx   02/28/2016 Imaging   Ct abdomen showed abnormal thinkening in his stomach   03/03/2016 Imaging   CT: Right sternocleidomastoid muscle metastasis appears less distinct but otherwise not significantly changed in size or configuration since 06/19/2015.2. Left level 3 lymph node which was hypermetabolic by PET-CT in January 2017 appears slightly smaller   04/01/2016 Imaging   CT cervical spine showed no acute fracture or traumatic malalignment in the cervical spine   04/22/2016 Procedure   Port-a-cath placed.   06/16/2016 Imaging   Ct neck showed right sternocleidomastoid muscle metastasis is further decreased in conspicuity since May, and has mildly decreased in size since September 2016. Continued stability of sub-centimeter left cervical lymph nodes. No new or progressive metastatic disease in the  neck.   06/16/2016 Imaging   CT chest showed stable masslike radiation fibrosis in the left upper lobe. Stable subcentimeter pulmonary nodules in the bilateral lower lobes. No new or progressive metastatic disease in the chest. Nonobstructing left renal stone.   10/13/2016 Imaging   Ct neck showed unchanged right sternocleidomastoid muscle metastasis. Unchanged subcentimeter left cervical lymph nodes. No evidence of new or progressive metastatic disease in the neck.   10/13/2016 Imaging   CT chest showed tiny hypervascular foci in the liver, not definitely seen on prior imaging of 06/16/2016 and 02/28/2016. Abdomen MRI without and with contrast recommended to further evaluate as metastatic disease is a concern. 2. Stable appearance of post treatment changes left upper lung and scattered tiny bilateral pulmonary nodules.   02/11/2017 Imaging   Ct neck: Lymph node mass right posterior neck appears improved from the prior study. Small posterior lymph nodes on the left unchanged. Occluded right jugular vein unchanged.   02/11/2017 Imaging   1. Similar appearance of postsurgical and radiation changes in the left upper lobe. 2. Similar bilateral pulmonary nodules. 3. No thoracic adenopathy. 4. Subtle foci of post-contrast enhancement within the liver are suboptimally characterized on this nondedicated study. Likely similar. These  could either be re-evaluated at followup or more entirely characterized with abdominal MRI. 5. Left nephrolithiasis.   05/19/2017 Imaging   Matted lymph node mass right posterior neck appears larger in the recent CT. Accurate measurements difficult due to infiltrating tumor margins and infiltration of the muscle. Right jugular vein again appears occluded or resected. Small left posterior lymph nodes stable. Left upper lobe airspace density stable and similar to the prior CT   06/03/2017 PET scan   1. Hypermetabolic ill-defined right level IIb lymph node, about 1.3 cm in diameter  with maximum SUV 9.5 (formerly 8.1). Appearance suspicious for residual/recurrent malignancy. No worrisome left-sided lesion. 2. Left suprahilar indistinct opacity demonstrates no worrisome hypermetabolic activity. The 5 mm left lower lobe pulmonary nodule is stable and not currently hypermetabolic although below sensitive PET-CT size thresholds. 3. Other imaging findings of potential clinical significance: Bilateral nonobstructive nephrolithiasis. Chronic bilateral maxillary sinusitis.   05/11/2018 PET scan   1. Continued chronic accentuated metabolic activity in the vicinity of right level IIB and the adjacent right sternocleidomastoid muscle, with ill definition of surrounding tissue planes. Maximum SUV is currently 8.1, formerly 9.5. Accentuated metabolic activity is been present in this vicinity back through 06/25/2015, and there was also some low-level activity in this vicinity on 06/07/2014. Some of this may be from scarring and local muscular activity although clearly a component of residual tumor is difficult to exclude given the focally high activity. 2. Other imaging findings of potential clinical significance: Chronic bilateral maxillary sinusitis. Chronic scarring in the left upper lobe. Chronically stable 5 mm left lower lobe nodule is considered benign. Nonobstructive left nephrolithiasis.   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 COMMENT:No significant keratinization is identified. Other basaloid carcinomas are in the differential diagnosis. No cell block material is available for further testing.   09/12/2018 Procedure   He underwent fine Needle Aspiration   10/04/2018 PET scan   1. Significant progression of local recurrence laterally in the mid right neck with an enlarging, increasingly hypermetabolic soft tissue mass. This involves  the right sternocleidomastoid muscle. 2. Small lymph nodes in the right axilla are increasingly hypermetabolic. These are nonspecific and potentially reactive, although could reflect a small metastases. Small hypermetabolic nodule in the left suprasternal notch is unchanged. 3. No other evidence of metastatic disease.    10/07/2018 - 12/23/2018 Chemotherapy   The patient had cisplatin plus gemzar   12/07/2018 Imaging   1. Decreased size of lateral right neck mass. 2. Unchanged soft tissue nodule in the suprasternal notch. 3. No evidence of new metastatic disease in the neck.    05/30/2019 Imaging   CT neck No clear change or progression compared to the study of March. Overall measurements of the right lateral neck mass are similar, approximately 3 x 1.8 cm. See above discussion. One could argue that there is slight increase in lateral bulging, possibly with an increase in contrast enhancement, towards the inferior margin. This is of questionable validity but could possibly represent some progression or inflammatory change. Other findings in the region are stable.   07/20/2019 - 09/28/2019 Chemotherapy   The patient had palonosetron (ALOXI) injection 0.25 mg, 0.25 mg, Intravenous,  Once, 3 of 4 cycles Administration: 0.25 mg (07/20/2019), 0.25 mg (08/10/2019), 0.25 mg (09/08/2019) CISplatin (PLATINOL) 84 mg in sodium chloride 0.9 % 250 mL chemo infusion, 40 mg/m2 = 84 mg (80 % of original  dose 50 mg/m2), Intravenous,  Once, 3 of 4 cycles Dose modification: 40 mg/m2 (80 % of original dose 50 mg/m2, Cycle 1, Reason: Dose Not Tolerated) Administration: 84 mg (07/20/2019), 84 mg (08/10/2019), 83 mg (09/08/2019) gemcitabine (GEMZAR) 1,600 mg in sodium chloride 0.9 % 250 mL chemo infusion, 1,672 mg (80 % of original dose 1,000 mg/m2), Intravenous,  Once, 3 of 4 cycles Dose modification: 800 mg/m2 (80 % of original dose 1,000 mg/m2, Cycle 1, Reason: Provider Judgment) Administration: 1,600 mg (07/20/2019),  1,600 mg (07/27/2019), 1,600 mg (08/10/2019), 1,600 mg (08/17/2019), 1,600 mg (09/08/2019), 1,672 mg (09/15/2019) ondansetron (ZOFRAN) 8 mg, dexamethasone (DECADRON) 10 mg in sodium chloride 0.9 % 50 mL IVPB, , Intravenous,  Once, 3 of 4 cycles Administration:  (09/15/2019) fosaprepitant (EMEND) 150 mg, dexamethasone (DECADRON) 12 mg in sodium chloride 0.9 % 145 mL IVPB, , Intravenous,  Once, 3 of 4 cycles Administration:  (07/20/2019),  (08/10/2019),  (09/08/2019)  for chemotherapy treatment.    10/02/2019 Imaging   CT neck As compared to 05/30/2019, no significant interval change in size of an ill-defined mass within the right lateral neck, again measuring 3.3 x 1.8 cm in transaxial dimensions.   Unchanged mildly enlarged left level I lymph node measuring 1.1 cm in short axis.   Unchanged node or nodule at the thoracic inlet, measuring 1.3 x 0.8 cm.   Please refer to concurrently performed chest CT for a description of findings below the level of the thoracic inlet.     10/02/2019 Imaging   CT chest 1. No new or progressive findings in the chest to suggest metastatic disease. 2. Bilateral subcentimeter solid pulmonary nodules are stable since 2018. 3. Hyperdense 1.1 cm anterior liver focus, not clearly visualized on prior studies. Suggest MRI abdomen without and with IV contrast for further characterization.   10/20/2019 -  Chemotherapy   The patient had ondansetron (ZOFRAN) injection 8 mg, 8 mg (100 % of original dose 8 mg), Intravenous,  Once, 1 of 5 cycles Dose modification: 8 mg (original dose 8 mg, Cycle 2) Administration: 8 mg (11/17/2019), 8 mg (12/15/2019) gemcitabine (GEMZAR) 2,000 mg in sodium chloride 0.9 % 250 mL chemo infusion, 2,090 mg, Intravenous,  Once, 2 of 6 cycles Administration: 2,000 mg (10/20/2019), 2,000 mg (11/03/2019), 2,000 mg (11/17/2019), 2,000 mg (12/15/2019)  for chemotherapy treatment.      REVIEW OF SYSTEMS:   Constitutional: Denies fevers, chills or  abnormal weight loss Eyes: Denies blurriness of vision Ears, nose, mouth, throat, and face: Denies mucositis or sore throat Respiratory: Denies cough, dyspnea or wheezes Cardiovascular: Denies palpitation, chest discomfort or lower extremity swelling Skin: Denies abnormal skin rashes Lymphatics: Denies new lymphadenopathy or easy bruising Neurological:Denies numbness, tingling or new weaknesses Behavioral/Psych: Mood is stable, no new changes  All other systems were reviewed with the patient and are negative.  I have reviewed the past medical history, past surgical history, social history and family history with the patient and they are unchanged from previous note.  ALLERGIES:  is allergic to phenergan [promethazine hcl]; heparin; and clindamycin.  MEDICATIONS:  Current Outpatient Medications  Medication Sig Dispense Refill  . atorvastatin (LIPITOR) 40 MG tablet TK 1 T PO  D    . cetirizine (ZYRTEC) 10 MG tablet Take 1 tablet (10 mg total) by mouth daily. 30 tablet 1  . ibuprofen (ADVIL) 600 MG tablet TK 1 T PO Q 6 H PRN    . levothyroxine (SYNTHROID) 137 MCG tablet Take 1 tablet (137 mcg total) by mouth  daily before breakfast. 90 tablet 9  . lidocaine (XYLOCAINE) 2 % solution Use as directed 5 mLs in the mouth or throat every 3 (three) hours as needed for mouth pain. Swish, gargle and spit 100 mL 2  . LORazepam (ATIVAN) 1 MG tablet Take 1 tablet (1 mg total) by mouth every 8 (eight) hours as needed for anxiety. 30 tablet 3  . omeprazole (PRILOSEC) 20 MG capsule Take 1 capsule (20 mg total) by mouth daily. 30 capsule 11  . ondansetron (ZOFRAN) 8 MG tablet Take 1 tablet (8 mg total) by mouth every 8 (eight) hours as needed for nausea or vomiting. 90 tablet 1  . Oxycodone HCl 10 MG TABS Take 1 tablet (10 mg total) by mouth every 8 (eight) hours as needed. 60 tablet 0  . PARoxetine (PAXIL) 20 MG tablet Take 1 tablet (20 mg total) by mouth daily. 30 tablet 5  . polyethylene glycol (MIRALAX)  packet Take 17 g by mouth daily. (Patient taking differently: Take 17 g by mouth daily as needed for mild constipation or moderate constipation. ) 14 each 3  . prochlorperazine (COMPAZINE) 10 MG tablet Take 1 tablet (10 mg total) by mouth every 12 (twelve) hours as needed (Nausea or vomiting). 30 tablet 1  . senna-docusate (SENOKOT-S) 8.6-50 MG tablet Take 2 tablets by mouth 3 (three) times daily. 90 tablet 0  . Vitamin D, Ergocalciferol, (DRISDOL) 1.25 MG (50000 UT) CAPS capsule TK 1 C PO WEEKLY     No current facility-administered medications for this visit.   Facility-Administered Medications Ordered in Other Visits  Medication Dose Route Frequency Provider Last Rate Last Admin  . anticoagulant sodium citrate solution 5 mL  5 mL Intracatheter Once Alvy Bimler, Mackey Varricchio, MD      . anticoagulant sodium citrate solution 5 mL  5 mL Intracatheter Once Alvy Bimler, Adley Castello, MD      . sodium chloride flush (NS) 0.9 % injection 10 mL  10 mL Intracatheter PRN Alvy Bimler, Asa Baudoin, MD   10 mL at 12/15/19 1128    PHYSICAL EXAMINATION: ECOG PERFORMANCE STATUS: 1 - Symptomatic but completely ambulatory  Vitals:   12/15/19 0855  BP: (!) 129/91  Pulse: 90  Resp: 18  Temp: 98 F (36.7 C)  SpO2: 100%   Filed Weights   12/15/19 0855  Weight: 190 lb 6.4 oz (86.4 kg)    GENERAL:alert, no distress and comfortable SKIN: skin color, texture, turgor are normal, no rashes or significant lesions EYES: normal, Conjunctiva are pink and non-injected, sclera clear OROPHARYNX:no exudate, no erythema and lips, buccal mucosa, and tongue normal  NECK: The nodule on the right side of his neck is stable  LYMPH:  no palpable lymphadenopathy in the cervical, axillary or inguinal LUNGS: clear to auscultation and percussion with normal breathing effort HEART: regular rate & rhythm and no murmurs and no lower extremity edema ABDOMEN:abdomen soft, non-tender and normal bowel sounds Musculoskeletal:no cyanosis of digits and no clubbing   NEURO: alert & oriented x 3 with fluent speech, no focal motor/sensory deficits  LABORATORY DATA:  I have reviewed the data as listed    Component Value Date/Time   NA 139 12/15/2019 0830   NA 139 09/22/2017 0829   K 3.3 (L) 12/15/2019 0830   K 3.5 09/22/2017 0829   CL 104 12/15/2019 0830   CO2 25 12/15/2019 0830   CO2 26 09/22/2017 0829   GLUCOSE 106 (H) 12/15/2019 0830   GLUCOSE 133 09/22/2017 0829   BUN 12 12/15/2019 0830  BUN 14.1 09/22/2017 0829   CREATININE 0.90 12/15/2019 0830   CREATININE 0.93 02/06/2019 1215   CREATININE 0.9 09/22/2017 0829   CALCIUM 9.2 12/15/2019 0830   CALCIUM 9.1 09/22/2017 0829   PROT 7.1 12/15/2019 0830   PROT 6.8 09/22/2017 0829   ALBUMIN 4.2 12/15/2019 0830   ALBUMIN 4.1 09/22/2017 0829   AST 26 12/15/2019 0830   AST 31 02/06/2019 1215   AST 22 09/22/2017 0829   ALT 33 12/15/2019 0830   ALT 48 (H) 02/06/2019 1215   ALT 30 09/22/2017 0829   ALKPHOS 72 12/15/2019 0830   ALKPHOS 55 09/22/2017 0829   BILITOT 0.3 12/15/2019 0830   BILITOT 0.2 (L) 02/06/2019 1215   BILITOT 0.35 09/22/2017 0829   GFRNONAA >60 12/15/2019 0830   GFRNONAA >60 02/06/2019 1215   GFRAA >60 12/15/2019 0830   GFRAA >60 02/06/2019 1215    No results found for: SPEP, UPEP  Lab Results  Component Value Date   WBC 5.3 12/15/2019   NEUTROABS 3.1 12/15/2019   HGB 13.7 12/15/2019   HCT 41.7 12/15/2019   MCV 90.8 12/15/2019   PLT 183 12/15/2019      Chemistry      Component Value Date/Time   NA 139 12/15/2019 0830   NA 139 09/22/2017 0829   K 3.3 (L) 12/15/2019 0830   K 3.5 09/22/2017 0829   CL 104 12/15/2019 0830   CO2 25 12/15/2019 0830   CO2 26 09/22/2017 0829   BUN 12 12/15/2019 0830   BUN 14.1 09/22/2017 0829   CREATININE 0.90 12/15/2019 0830   CREATININE 0.93 02/06/2019 1215   CREATININE 0.9 09/22/2017 0829      Component Value Date/Time   CALCIUM 9.2 12/15/2019 0830   CALCIUM 9.1 09/22/2017 0829   ALKPHOS 72 12/15/2019 0830   ALKPHOS 55  09/22/2017 0829   AST 26 12/15/2019 0830   AST 31 02/06/2019 1215   AST 22 09/22/2017 0829   ALT 33 12/15/2019 0830   ALT 48 (H) 02/06/2019 1215   ALT 30 09/22/2017 0829   BILITOT 0.3 12/15/2019 0830   BILITOT 0.2 (L) 02/06/2019 1215   BILITOT 0.35 09/22/2017 4709

## 2019-12-16 ENCOUNTER — Inpatient Hospital Stay: Payer: Medicaid Other

## 2019-12-16 ENCOUNTER — Other Ambulatory Visit: Payer: Self-pay

## 2019-12-16 VITALS — BP 140/76 | HR 69 | Temp 99.2°F | Resp 16

## 2019-12-16 DIAGNOSIS — Z5111 Encounter for antineoplastic chemotherapy: Secondary | ICD-10-CM | POA: Diagnosis not present

## 2019-12-16 DIAGNOSIS — C7800 Secondary malignant neoplasm of unspecified lung: Secondary | ICD-10-CM

## 2019-12-16 DIAGNOSIS — C760 Malignant neoplasm of head, face and neck: Secondary | ICD-10-CM

## 2019-12-16 DIAGNOSIS — Z95828 Presence of other vascular implants and grafts: Secondary | ICD-10-CM

## 2019-12-16 MED ORDER — SODIUM CHLORIDE 0.9% FLUSH
10.0000 mL | Freq: Once | INTRAVENOUS | Status: DC
  Filled 2019-12-16: qty 10

## 2019-12-16 MED ORDER — HEPARIN SOD (PORK) LOCK FLUSH 100 UNIT/ML IV SOLN
500.0000 [IU] | Freq: Once | INTRAVENOUS | Status: DC | PRN
  Filled 2019-12-16: qty 5

## 2019-12-16 MED ORDER — ALTEPLASE 2 MG IJ SOLR
2.0000 mg | Freq: Once | INTRAMUSCULAR | Status: DC | PRN
  Filled 2019-12-16: qty 2

## 2019-12-16 MED ORDER — SODIUM CHLORIDE 0.9% FLUSH
3.0000 mL | Freq: Once | INTRAVENOUS | Status: DC | PRN
  Filled 2019-12-16: qty 10

## 2019-12-16 MED ORDER — HEPARIN SOD (PORK) LOCK FLUSH 100 UNIT/ML IV SOLN
250.0000 [IU] | Freq: Once | INTRAVENOUS | Status: DC | PRN
  Filled 2019-12-16: qty 5

## 2019-12-16 MED ORDER — SODIUM CHLORIDE 0.9 % IV SOLN
Freq: Once | INTRAVENOUS | Status: AC
  Filled 2019-12-16: qty 250

## 2019-12-16 MED ORDER — ONDANSETRON HCL 4 MG/2ML IJ SOLN
INTRAMUSCULAR | Status: AC
  Filled 2019-12-16: qty 4

## 2019-12-16 MED ORDER — ONDANSETRON HCL 4 MG/2ML IJ SOLN
8.0000 mg | Freq: Once | INTRAMUSCULAR | Status: AC
  Administered 2019-12-16: 8 mg via INTRAVENOUS

## 2019-12-16 MED ORDER — SODIUM CHLORIDE 0.9% FLUSH
10.0000 mL | Freq: Once | INTRAVENOUS | Status: AC | PRN
  Administered 2019-12-16: 10 mL
  Filled 2019-12-16: qty 10

## 2019-12-16 NOTE — Patient Instructions (Signed)

## 2019-12-25 NOTE — Progress Notes (Signed)
Pharmacist Chemotherapy Monitoring - Follow Up Assessment    I verify that I have reviewed each item in the below checklist:  . Regimen for the patient is scheduled for the appropriate day and plan matches scheduled date. Marland Kitchen Appropriate non-routine labs are ordered dependent on drug ordered. . If applicable, additional medications reviewed and ordered per protocol based on lifetime cumulative doses and/or treatment regimen.   Plan for follow-up and/or issues identified: Yes . I-vent associated with next due treatment: Yes, Keep Zofran as IVPB . MD and/or nursing notified: No  Acquanetta Belling 12/25/2019 3:14 PM

## 2019-12-29 ENCOUNTER — Inpatient Hospital Stay: Payer: Medicaid Other

## 2019-12-29 ENCOUNTER — Other Ambulatory Visit: Payer: Self-pay | Admitting: Medical

## 2019-12-29 ENCOUNTER — Other Ambulatory Visit: Payer: Medicaid Other

## 2019-12-29 ENCOUNTER — Ambulatory Visit: Payer: Medicaid Other

## 2019-12-29 ENCOUNTER — Inpatient Hospital Stay (HOSPITAL_BASED_OUTPATIENT_CLINIC_OR_DEPARTMENT_OTHER): Payer: Medicaid Other | Admitting: Medical

## 2019-12-29 ENCOUNTER — Other Ambulatory Visit: Payer: Self-pay

## 2019-12-29 VITALS — BP 135/70 | HR 82 | Temp 98.3°F | Resp 19

## 2019-12-29 DIAGNOSIS — Z5111 Encounter for antineoplastic chemotherapy: Secondary | ICD-10-CM | POA: Diagnosis not present

## 2019-12-29 DIAGNOSIS — C7800 Secondary malignant neoplasm of unspecified lung: Secondary | ICD-10-CM

## 2019-12-29 DIAGNOSIS — Z7189 Other specified counseling: Secondary | ICD-10-CM

## 2019-12-29 DIAGNOSIS — C760 Malignant neoplasm of head, face and neck: Secondary | ICD-10-CM

## 2019-12-29 DIAGNOSIS — Z95828 Presence of other vascular implants and grafts: Secondary | ICD-10-CM

## 2019-12-29 DIAGNOSIS — E860 Dehydration: Secondary | ICD-10-CM

## 2019-12-29 DIAGNOSIS — E039 Hypothyroidism, unspecified: Secondary | ICD-10-CM

## 2019-12-29 LAB — COMPREHENSIVE METABOLIC PANEL
ALT: 56 U/L — ABNORMAL HIGH (ref 0–44)
AST: 29 U/L (ref 15–41)
Albumin: 4.1 g/dL (ref 3.5–5.0)
Alkaline Phosphatase: 72 U/L (ref 38–126)
Anion gap: 10 (ref 5–15)
BUN: 15 mg/dL (ref 6–20)
CO2: 28 mmol/L (ref 22–32)
Calcium: 9.3 mg/dL (ref 8.9–10.3)
Chloride: 104 mmol/L (ref 98–111)
Creatinine, Ser: 1.07 mg/dL (ref 0.61–1.24)
GFR calc Af Amer: >60 mL/min (ref >60)
GFR calc non Af Amer: >60 mL/min (ref >60)
Glucose, Bld: 142 mg/dL — ABNORMAL HIGH (ref 70–99)
Potassium: 3.7 mmol/L (ref 3.5–5.1)
Sodium: 142 mmol/L (ref 135–145)
Total Bilirubin: 0.3 mg/dL (ref 0.3–1.2)
Total Protein: 7.1 g/dL (ref 6.5–8.1)

## 2019-12-29 LAB — CBC WITH DIFFERENTIAL/PLATELET
Abs Immature Granulocytes: 0.01 10*3/uL (ref 0.00–0.07)
Basophils Absolute: 0 10*3/uL (ref 0.0–0.1)
Basophils Relative: 0 %
Eosinophils Absolute: 0.1 10*3/uL (ref 0.0–0.5)
Eosinophils Relative: 2 %
HCT: 40.5 % (ref 39.0–52.0)
Hemoglobin: 13.4 g/dL (ref 13.0–17.0)
Immature Granulocytes: 0 %
Lymphocytes Relative: 21 %
Lymphs Abs: 1.2 10*3/uL (ref 0.7–4.0)
MCH: 30.2 pg (ref 26.0–34.0)
MCHC: 33.1 g/dL (ref 30.0–36.0)
MCV: 91.2 fL (ref 80.0–100.0)
Monocytes Absolute: 0.6 10*3/uL (ref 0.1–1.0)
Monocytes Relative: 10 %
Neutro Abs: 3.7 10*3/uL (ref 1.7–7.7)
Neutrophils Relative %: 67 %
Platelets: 291 10*3/uL (ref 150–400)
RBC: 4.44 MIL/uL (ref 4.22–5.81)
RDW: 14.2 % (ref 11.5–15.5)
WBC: 5.6 10*3/uL (ref 4.0–10.5)
nRBC: 0 % (ref 0.0–0.2)

## 2019-12-29 MED ORDER — SODIUM CHLORIDE 0.9% FLUSH
10.0000 mL | INTRAVENOUS | Status: DC | PRN
  Administered 2019-12-29: 10 mL via INTRAVENOUS
  Filled 2019-12-29: qty 10

## 2019-12-29 MED ORDER — OXYCODONE-ACETAMINOPHEN 5-325 MG PO TABS
ORAL_TABLET | ORAL | Status: AC
  Filled 2019-12-29: qty 1

## 2019-12-29 MED ORDER — SODIUM CHLORIDE 0.9 % IV SOLN
2000.0000 mg | Freq: Once | INTRAVENOUS | Status: AC
  Administered 2019-12-29: 2000 mg via INTRAVENOUS
  Filled 2019-12-29: qty 52.6

## 2019-12-29 MED ORDER — ANTICOAGULANT SODIUM CITRATE 4% (200MG/5ML) IV SOLN
5.0000 mL | Freq: Once | Status: AC
  Administered 2019-12-29: 5 mL
  Filled 2019-12-29: qty 5

## 2019-12-29 MED ORDER — SODIUM CHLORIDE 0.9% FLUSH
10.0000 mL | INTRAVENOUS | Status: DC | PRN
  Administered 2019-12-29: 10 mL
  Filled 2019-12-29: qty 10

## 2019-12-29 MED ORDER — SODIUM CHLORIDE 0.9 % IV SOLN
INTRAVENOUS | Status: DC
  Filled 2019-12-29: qty 250

## 2019-12-29 MED ORDER — SODIUM CHLORIDE 0.9 % IV SOLN
Freq: Once | INTRAVENOUS | Status: AC
  Filled 2019-12-29: qty 250

## 2019-12-29 MED ORDER — ONDANSETRON HCL 4 MG/2ML IJ SOLN
INTRAMUSCULAR | Status: AC
  Filled 2019-12-29: qty 4

## 2019-12-29 MED ORDER — ONDANSETRON HCL 4 MG/2ML IJ SOLN
8.0000 mg | Freq: Once | INTRAMUSCULAR | Status: AC
  Administered 2019-12-29: 8 mg via INTRAVENOUS

## 2019-12-29 MED ORDER — OXYCODONE-ACETAMINOPHEN 5-325 MG PO TABS
1.0000 | ORAL_TABLET | Freq: Once | ORAL | Status: AC
  Administered 2019-12-29: 1 via ORAL

## 2019-12-29 NOTE — Patient Instructions (Signed)
[facility] Discharge Instructions for Patients Receiving Chemotherapy  Today you received the following chemotherapy agents: Gemcitabine (Gemzar)  To help prevent nausea and vomiting after your treatment, we encourage you to take your nausea medication as directed by your provider.   If you develop nausea and vomiting that is not controlled by your nausea medication, call the clinic.   BELOW ARE SYMPTOMS THAT SHOULD BE REPORTED IMMEDIATELY:  *FEVER GREATER THAN 100.5 F  *CHILLS WITH OR WITHOUT FEVER  NAUSEA AND VOMITING THAT IS NOT CONTROLLED WITH YOUR NAUSEA MEDICATION  *UNUSUAL SHORTNESS OF BREATH  *UNUSUAL BRUISING OR BLEEDING  TENDERNESS IN MOUTH AND THROAT WITH OR WITHOUT PRESENCE OF ULCERS  *URINARY PROBLEMS  *BOWEL PROBLEMS  UNUSUAL RASH Items with * indicate a potential emergency and should be followed up as soon as possible.  Feel free to call the clinic should you have any questions or concerns. The clinic phone number is [phone].  Please show the [facility] at check-in to the Emergency Department and triage nurse.

## 2019-12-30 ENCOUNTER — Inpatient Hospital Stay: Payer: Medicaid Other

## 2019-12-30 VITALS — BP 136/63 | HR 79 | Temp 98.9°F | Resp 18

## 2019-12-30 DIAGNOSIS — Z5111 Encounter for antineoplastic chemotherapy: Secondary | ICD-10-CM | POA: Diagnosis not present

## 2019-12-30 DIAGNOSIS — C760 Malignant neoplasm of head, face and neck: Secondary | ICD-10-CM

## 2019-12-30 DIAGNOSIS — C7800 Secondary malignant neoplasm of unspecified lung: Secondary | ICD-10-CM

## 2019-12-30 DIAGNOSIS — Z95828 Presence of other vascular implants and grafts: Secondary | ICD-10-CM

## 2019-12-30 MED ORDER — ONDANSETRON HCL 4 MG/2ML IJ SOLN
INTRAMUSCULAR | Status: AC
  Filled 2019-12-30: qty 4

## 2019-12-30 MED ORDER — ANTICOAGULANT SODIUM CITRATE 4% (200MG/5ML) IV SOLN
5.0000 mL | Freq: Once | Status: AC
  Administered 2019-12-30: 12:00:00 5 mL

## 2019-12-30 MED ORDER — SODIUM CHLORIDE 0.9 % IV SOLN
Freq: Once | INTRAVENOUS | Status: AC
  Filled 2019-12-30: qty 250

## 2019-12-30 MED ORDER — ONDANSETRON HCL 4 MG/2ML IJ SOLN
8.0000 mg | Freq: Once | INTRAMUSCULAR | Status: AC
  Administered 2019-12-30: 11:00:00 8 mg via INTRAVENOUS

## 2020-01-01 ENCOUNTER — Telehealth: Payer: Self-pay

## 2020-01-01 ENCOUNTER — Telehealth: Payer: Self-pay | Admitting: Medical

## 2020-01-01 ENCOUNTER — Other Ambulatory Visit: Payer: Self-pay | Admitting: Hematology and Oncology

## 2020-01-01 LAB — TSH: TSH: 0.697 u[IU]/mL (ref 0.320–4.118)

## 2020-01-01 MED ORDER — OXYCODONE HCL 10 MG PO TABS: 10.0000 mg | ORAL_TABLET | Freq: Three times a day (TID) | ORAL | 0 refills | Status: DC | PRN

## 2020-01-01 NOTE — Progress Notes (Signed)
The patient was seen in the infusion room today.  He went to the emergency room last evening to have his tear ducts opened as they were blocked.  He did not bring his medications with them today and is having pain.  He also asked to be scheduled to receive IV fluids tomorrow.  He was given 1 Percocet and scheduled to return tomorrow for IV fluids.  Sandi Mealy, MHS, PA-C Physician Assistant

## 2020-01-01 NOTE — Telephone Encounter (Signed)
No 3/26 los. No changes made to pt schedule.

## 2020-01-01 NOTE — Telephone Encounter (Signed)
He called and left a message to call him. Requesting refill on Oxycodone.  Called back. After the last 2 chemo treatments he has had after he gets home vomiting, fever and chills. Denies vomiting today. Complaining of nausea. He has taken Zofran and is trying to push fluids. Offered appt for IV fluids, he declined appt. He came for IV fluids on 3/27. He does not know if it a mental thing but he does not want to come into [facility]. He is feeling weak and in bed today. He is complaining of constipation. No bm since Thursday. He has taken miralax daily and is not sure if could take it BID. He gave himself a enema after previous treatment.  He thinks he may want appt with you on Friday?

## 2020-01-01 NOTE — Telephone Encounter (Signed)
Called back and left a message for him to call the office. Offered appt with Dr. Alvy Bimler for 4/2 at 8 am or 1030.

## 2020-01-04 ENCOUNTER — Telehealth: Payer: Self-pay

## 2020-01-04 NOTE — Telephone Encounter (Signed)
He called and left a message. His phone was messed up and he is just getting previous message about offer of appt. He would like to see Dr. Alvy Bimler at [facility] as previously offered for tomorrow.  Appt scheduled.

## 2020-01-05 ENCOUNTER — Telehealth: Payer: Self-pay | Admitting: Hematology and Oncology

## 2020-01-05 ENCOUNTER — Other Ambulatory Visit: Payer: Self-pay

## 2020-01-05 ENCOUNTER — Encounter: Payer: Self-pay | Admitting: Hematology and Oncology

## 2020-01-05 ENCOUNTER — Inpatient Hospital Stay: Payer: Medicaid Other | Attending: Hematology and Oncology | Admitting: Hematology and Oncology

## 2020-01-05 DIAGNOSIS — C760 Malignant neoplasm of head, face and neck: Secondary | ICD-10-CM

## 2020-01-05 DIAGNOSIS — R112 Nausea with vomiting, unspecified: Secondary | ICD-10-CM | POA: Diagnosis not present

## 2020-01-05 DIAGNOSIS — C119 Malignant neoplasm of nasopharynx, unspecified: Secondary | ICD-10-CM | POA: Insufficient documentation

## 2020-01-05 DIAGNOSIS — G893 Neoplasm related pain (acute) (chronic): Secondary | ICD-10-CM | POA: Insufficient documentation

## 2020-01-05 MED ORDER — LORAZEPAM 1 MG PO TABS: 1.0000 mg | ORAL_TABLET | Freq: Three times a day (TID) | ORAL | 3 refills | Status: DC | PRN

## 2020-01-05 NOTE — Assessment & Plan Note (Signed)
His pain is well controlled with oxycodone He will continue the same

## 2020-01-05 NOTE — Assessment & Plan Note (Signed)
He tolerated treatment very poorly with weakness, severe nausea and significant stress taking care of his wife who is also unwell I recommend the patient takes a treatment break His neck exam is stable I told the patient he will not compromise his longevity by taking a treatment break He ultimately agrees I will assess him on a monthly basis to monitor for signs and symptoms of disease progression

## 2020-01-05 NOTE — Telephone Encounter (Signed)
4/2 sch message - unable to reach pt . Left message with appt date and time

## 2020-01-05 NOTE — Progress Notes (Signed)
[facility] OFFICE PROGRESS NOTE  Patient Care Team: Patient, No Pcp Per as PCP - General (General Practice) [name], MD as Consulting Physician (Hematology and Oncology) Eppie Gibson, MD as Attending Physician (Radiation Oncology) Jodi Marble, MD as Consulting Physician (Otolaryngology) Philomena Doheny, MD as Referring Physician (Plastic Surgery) Irene Shipper, MD as Consulting Physician (Gastroenterology) System, Provider Not In  ASSESSMENT & PLAN:  Primary cancer of head and neck [facility]) He tolerated treatment very poorly with weakness, severe nausea and significant stress taking care of his wife who is also unwell I recommend the patient takes a treatment break His neck exam is stable I told the patient he will not compromise his longevity by taking a treatment break He ultimately agrees I will assess him on a monthly basis to monitor for signs and symptoms of disease progression  Cancer associated pain His pain is well controlled with oxycodone He will continue the same  Nausea and vomiting He has profound recurrent nausea and vomiting despite aggressive supportive care I recommend the patient to treat take a treatment break and he agrees   No orders of the defined types were placed in this encounter.   All questions were answered. The patient knows to call the clinic with any problems, questions or concerns. The total time spent in the appointment was 20 minutes encounter with patients including review of chart and various tests results, discussions about plan of care and coordination of care plan   [name], MD 01/05/2020 10:47 AM  INTERVAL HISTORY: Please see below for problem oriented charting. 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 Pan-endoscopy elsewhere showed cancer from nasopharyngeal space.   01/04/2007 - 02/20/2007 Chemotherapy   He received 2 cycles of cisplatin and 5FU followed by concurrent chemo with weekly cisplatin and radiation. He only received 2 doses of chemo due to severe mucositis, nausea and weight loss.   04/05/2007 - 08/04/2007 Chemotherapy   He received 4 more courses of cisplatin with 5FU and had complete response   07/05/2009 Procedure   Fine-needle aspirate of the right level II lymph nodes come from recurrent metastatic disease. Repeat endoscopy and CT scan show no evidence of disease elsewhere.   07/08/2009 - 12/02/2009 Chemotherapy   He was given 6 cycles of carboplatin, 5-FU and docetaxel   12/03/2009 Surgery   He has surgery to the residual lymph node on the right neck which showed no evidence of disease.   02/22/2012 Imaging   Repeat imaging study showed large recurrent mass. He was referred elsewhere for further treatment.   05/03/2012 Surgery   He underwent  left upper lobectomy.   04/29/2013 Imaging   PEt scan showed lesion on right level II B and lower lung was abnormal   06/03/2013 - 02/02/2014 Chemotherapy   He had 6 cycles of chemotherapy when he was found to have recurrence of cancer and had received oxaliplatin and capecitabine    06/07/2014 Imaging   PET CT scan showed persistent disease in the right neck lymph nodes and left lung   06/29/2014 Procedure   Accession: [id number] repeat LUL biopsy confirmed metastatic cancer   07/18/2014 - 07/31/2014 Radiation Therapy   He received palliative radiation therapy to the lungs   10/10/2014 Imaging   CT scan of the chest, abdomen and pelvis show regression in the size of the lung nodule in the left upper lobe and stable pulmonary nodules   01/24/2015 Imaging   CT scan showed stable disease in neck and lung   06/19/2015 Imaging   CT scan of the neck and the chest show possible mild progression of the nodule in the right side of the neck.   06/25/2015 Imaging   PET scan confirmed disease recurrence in the neck   07/07/2015 Imaging   [facility]   09/03/2015 - 08/26/2018 Chemotherapy   He received palliative chemo with Nivolumab   10/29/2015 Imaging   PET CT showed positive response to Rx   02/28/2016 Imaging   Ct abdomen showed abnormal thinkening in his stomach   03/03/2016 Imaging   CT: Right sternocleidomastoid muscle metastasis appears less distinct but otherwise not significantly changed in size or configuration since 06/19/2015.2. Left level 3 lymph node which was hypermetabolic by PET-CT in January 2017 appears slightly smaller   04/01/2016 Imaging   CT cervical spine showed no acute fracture or traumatic malalignment in the cervical spine   04/22/2016 Procedure   Port-a-cath placed.   06/16/2016 Imaging   Ct neck showed right sternocleidomastoid muscle metastasis is further decreased in conspicuity since May, and has mildly decreased in size since September 2016. Continued stability of sub-centimeter left cervical lymph nodes. No new or progressive metastatic disease in the neck.   06/16/2016 Imaging   CT chest showed stable masslike radiation fibrosis in the left upper lobe. Stable subcentimeter pulmonary nodules in the bilateral lower lobes. No new  or progressive metastatic disease in the chest. Nonobstructing left renal stone.   10/13/2016 Imaging   Ct neck showed unchanged right sternocleidomastoid muscle metastasis. Unchanged subcentimeter left cervical lymph nodes. No evidence of new or progressive metastatic disease in the neck.   10/13/2016 Imaging   CT chest showed tiny hypervascular foci in the liver, not definitely seen on prior imaging of 06/16/2016 and 02/28/2016. Abdomen MRI without and with contrast recommended to further evaluate as metastatic disease is a concern. 2. Stable appearance of post treatment changes left upper lung and scattered tiny bilateral pulmonary nodules.   02/11/2017 Imaging   Ct neck: Lymph node mass right posterior neck appears improved from the prior study. Small posterior lymph nodes on the left unchanged. Occluded right jugular vein unchanged.   02/11/2017 Imaging   1. Similar appearance of postsurgical and radiation changes in the left upper lobe. 2. Similar bilateral pulmonary nodules. 3. No thoracic adenopathy. 4. Subtle foci of post-contrast enhancement within the liver are suboptimally characterized on this nondedicated study. Likely similar. These could either be re-evaluated at followup or more entirely characterized with abdominal MRI. 5. Left nephrolithiasis.   05/19/2017 Imaging   Matted lymph node mass right posterior neck  appears larger in the recent CT. Accurate measurements difficult due to infiltrating tumor margins and infiltration of the muscle. Right jugular vein again appears occluded or resected. Small left posterior lymph nodes stable. Left upper lobe airspace density stable and similar to the prior CT   06/03/2017 PET scan   1. Hypermetabolic ill-defined right level IIb lymph node, about 1.3 cm in diameter with maximum SUV 9.5 (formerly 8.1). Appearance suspicious for residual/recurrent malignancy. No worrisome left-sided lesion. 2. Left suprahilar indistinct opacity demonstrates no  worrisome hypermetabolic activity. The 5 mm left lower lobe pulmonary nodule is stable and not currently hypermetabolic although below sensitive PET-CT size thresholds. 3. Other imaging findings of potential clinical significance: Bilateral nonobstructive nephrolithiasis. Chronic bilateral maxillary sinusitis.   05/11/2018 PET scan   1. Continued chronic accentuated metabolic activity in the vicinity of right level IIB and the adjacent right sternocleidomastoid muscle, with ill definition of surrounding tissue planes. Maximum SUV is currently 8.1, formerly 9.5. Accentuated metabolic activity is been present in this vicinity back through 06/25/2015, and there was also some low-level activity in this vicinity on 06/07/2014. Some of this may be from scarring and local muscular activity although clearly a component of residual tumor is difficult to exclude given the focally high activity. 2. Other imaging findings of potential clinical significance: Chronic bilateral maxillary sinusitis. Chronic scarring in the left upper lobe. Chronically stable 5 mm left lower lobe nodule is considered benign. Nonobstructive left nephrolithiasis.   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 COMMENT:No significant keratinization is identified. Other basaloid carcinomas are in the differential diagnosis. No cell block material is available for further testing.   09/12/2018 Procedure   He underwent fine Needle Aspiration   10/04/2018 PET scan   1. Significant progression of local recurrence laterally in the mid right neck with an enlarging, increasingly hypermetabolic soft tissue mass. This involves the right sternocleidomastoid muscle. 2. Small lymph nodes in the right axilla are increasingly hypermetabolic. These are nonspecific and potentially reactive, although could  reflect a small metastases. Small hypermetabolic nodule in the left suprasternal notch is unchanged. 3. No other evidence of metastatic disease.    10/07/2018 - 12/23/2018 Chemotherapy   The patient had cisplatin plus gemzar   12/07/2018 Imaging   1. Decreased size of lateral right neck mass. 2. Unchanged soft tissue nodule in the suprasternal notch. 3. No evidence of new metastatic disease in the neck.    05/30/2019 Imaging   CT neck No clear change or progression compared to the study of March. Overall measurements of the right lateral neck mass are similar, approximately 3 x 1.8 cm. See above discussion. One could argue that there is slight increase in lateral bulging, possibly with an increase in contrast enhancement, towards the inferior margin. This is of questionable validity but could possibly represent some progression or inflammatory change. Other findings in the region are stable.   07/20/2019 - 09/28/2019 Chemotherapy   The patient had palonosetron (ALOXI) injection 0.25 mg, 0.25 mg, Intravenous,  Once, 3 of 4 cycles Administration: 0.25 mg (07/20/2019), 0.25 mg (08/10/2019), 0.25 mg (09/08/2019) CISplatin (PLATINOL) 84 mg in sodium chloride 0.9 % 250 mL chemo infusion, 40 mg/m2 = 84 mg (80 % of original dose 50 mg/m2), Intravenous,  Once, 3 of 4 cycles Dose modification: 40 mg/m2 (80 % of original dose 50 mg/m2, Cycle 1, Reason: Dose Not Tolerated) Administration: 84  mg (07/20/2019), 84 mg (08/10/2019), 83 mg (09/08/2019) gemcitabine (GEMZAR) 1,600 mg in sodium chloride 0.9 % 250 mL chemo infusion, 1,672 mg (80 % of original dose 1,000 mg/m2), Intravenous,  Once, 3 of 4 cycles Dose modification: 800 mg/m2 (80 % of original dose 1,000 mg/m2, Cycle 1, Reason: Provider Judgment) Administration: 1,600 mg (07/20/2019), 1,600 mg (07/27/2019), 1,600 mg (08/10/2019), 1,600 mg (08/17/2019), 1,600 mg (09/08/2019), 1,672 mg (09/15/2019) ondansetron (ZOFRAN) 8 mg, dexamethasone (DECADRON) 10 mg in  sodium chloride 0.9 % 50 mL IVPB, , Intravenous,  Once, 3 of 4 cycles Administration:  (09/15/2019) fosaprepitant (EMEND) 150 mg, dexamethasone (DECADRON) 12 mg in sodium chloride 0.9 % 145 mL IVPB, , Intravenous,  Once, 3 of 4 cycles Administration:  (07/20/2019),  (08/10/2019),  (09/08/2019)  for chemotherapy treatment.    10/02/2019 Imaging   CT neck As compared to 05/30/2019, no significant interval change in size of an ill-defined mass within the right lateral neck, again measuring 3.3 x 1.8 cm in transaxial dimensions.   Unchanged mildly enlarged left level I lymph node measuring 1.1 cm in short axis.   Unchanged node or nodule at the thoracic inlet, measuring 1.3 x 0.8 cm.   Please refer to concurrently performed chest CT for a description of findings below the level of the thoracic inlet.     10/02/2019 Imaging   CT chest 1. No new or progressive findings in the chest to suggest metastatic disease. 2. Bilateral subcentimeter solid pulmonary nodules are stable since 2018. 3. Hyperdense 1.1 cm anterior liver focus, not clearly visualized on prior studies. Suggest MRI abdomen without and with IV contrast for further characterization.   10/20/2019 -  Chemotherapy   The patient had ondansetron (ZOFRAN) injection 8 mg, 8 mg (100 % of original dose 8 mg), Intravenous,  Once, 2 of 5 cycles Dose modification: 8 mg (original dose 8 mg, Cycle 2) Administration: 8 mg (11/17/2019), 8 mg (12/15/2019), 8 mg (12/29/2019) gemcitabine (GEMZAR) 2,000 mg in sodium chloride 0.9 % 250 mL chemo infusion, 2,090 mg, Intravenous,  Once, 3 of 6 cycles Administration: 2,000 mg (10/20/2019), 2,000 mg (11/03/2019), 2,000 mg (11/17/2019), 2,000 mg (12/15/2019), 2,000 mg (12/29/2019)  for chemotherapy treatment.      REVIEW OF SYSTEMS:   Constitutional: Denies fevers, chills or abnormal weight loss Eyes: Denies blurriness of vision Ears, nose, mouth, throat, and face: Denies mucositis or sore throat Respiratory:  Denies cough, dyspnea or wheezes Cardiovascular: Denies palpitation, chest discomfort or lower extremity swelling Skin: Denies abnormal skin rashes Lymphatics: Denies new lymphadenopathy or easy bruising Neurological:Denies numbness, tingling or new weaknesses Behavioral/Psych: Mood is stable, no new changes  All other systems were reviewed with the patient and are negative.  I have reviewed the past medical history, past surgical history, social history and family history with the patient and they are unchanged from previous note.  ALLERGIES:  is allergic to phenergan [promethazine hcl]; heparin; and clindamycin.  MEDICATIONS:  Current Outpatient Medications  Medication Sig Dispense Refill  . atorvastatin (LIPITOR) 40 MG tablet TK 1 T PO  D    . cetirizine (ZYRTEC) 10 MG tablet Take 1 tablet (10 mg total) by mouth daily. 30 tablet 1  . ibuprofen (ADVIL) 600 MG tablet TK 1 T PO Q 6 H PRN    . levothyroxine (SYNTHROID) 137 MCG tablet Take 1 tablet (137 mcg total) by mouth daily before breakfast. 90 tablet 9  . lidocaine (XYLOCAINE) 2 % solution Use as directed 5 mLs in the mouth or throat  every 3 (three) hours as needed for mouth pain. Swish, gargle and spit 100 mL 2  . LORazepam (ATIVAN) 1 MG tablet Take 1 tablet (1 mg total) by mouth every 8 (eight) hours as needed for anxiety. 30 tablet 3  . omeprazole (PRILOSEC) 20 MG capsule Take 1 capsule (20 mg total) by mouth daily. 30 capsule 11  . ondansetron (ZOFRAN) 8 MG tablet Take 1 tablet (8 mg total) by mouth every 8 (eight) hours as needed for nausea or vomiting. 90 tablet 1  . Oxycodone HCl 10 MG TABS Take 1 tablet (10 mg total) by mouth every 8 (eight) hours as needed. 60 tablet 0  . PARoxetine (PAXIL) 20 MG tablet Take 1 tablet (20 mg total) by mouth daily. 30 tablet 5  . polyethylene glycol (MIRALAX) packet Take 17 g by mouth daily. (Patient taking differently: Take 17 g by mouth daily as needed for mild constipation or moderate  constipation. ) 14 each 3  . prochlorperazine (COMPAZINE) 10 MG tablet Take 1 tablet (10 mg total) by mouth every 12 (twelve) hours as needed (Nausea or vomiting). 30 tablet 1  . senna-docusate (SENOKOT-S) 8.6-50 MG tablet Take 2 tablets by mouth 3 (three) times daily. 90 tablet 0  . Vitamin D, Ergocalciferol, (DRISDOL) 1.25 MG (50000 UT) CAPS capsule TK 1 C PO WEEKLY     No current facility-administered medications for this visit.   Facility-Administered Medications Ordered in Other Visits  Medication Dose Route Frequency Provider Last Rate Last Admin  . anticoagulant sodium citrate solution 5 mL  5 mL Intracatheter Once Alvy Bimler, Meril Dray, MD      . anticoagulant sodium citrate solution 5 mL  5 mL Intracatheter Once Alvy Bimler, Dorothy Landgrebe, MD        PHYSICAL EXAMINATION: ECOG PERFORMANCE STATUS: 1 - Symptomatic but completely ambulatory  Vitals:   01/05/20 0803  BP: (!) 131/92  Pulse: 81  Resp: 18  Temp: 98.7 F (37.1 C)  SpO2: 100%   Filed Weights   01/05/20 0803  Weight: 186 lb 6.4 oz (84.6 kg)    GENERAL:alert, no distress and comfortable SKIN: No significant change to the skin lesion on the right side of his neck NEURO: alert & oriented x 3 with fluent speech, no focal motor/sensory deficits  LABORATORY DATA:  I have reviewed the data as listed    Component Value Date/Time   NA 142 12/29/2019 1352   NA 139 09/22/2017 0829   K 3.7 12/29/2019 1352   K 3.5 09/22/2017 0829   CL 104 12/29/2019 1352   CO2 28 12/29/2019 1352   CO2 26 09/22/2017 0829   GLUCOSE 142 (H) 12/29/2019 1352   GLUCOSE 133 09/22/2017 0829   BUN 15 12/29/2019 1352   BUN 14.1 09/22/2017 0829   CREATININE 1.07 12/29/2019 1352   CREATININE 0.93 02/06/2019 1215   CREATININE 0.9 09/22/2017 0829   CALCIUM 9.3 12/29/2019 1352   CALCIUM 9.1 09/22/2017 0829   PROT 7.1 12/29/2019 1352   PROT 6.8 09/22/2017 0829   ALBUMIN 4.1 12/29/2019 1352   ALBUMIN 4.1 09/22/2017 0829   AST 29 12/29/2019 1352   AST 31 02/06/2019  1215   AST 22 09/22/2017 0829   ALT 56 (H) 12/29/2019 1352   ALT 48 (H) 02/06/2019 1215   ALT 30 09/22/2017 0829   ALKPHOS 72 12/29/2019 1352   ALKPHOS 55 09/22/2017 0829   BILITOT 0.3 12/29/2019 1352   BILITOT 0.2 (L) 02/06/2019 1215   BILITOT 0.35 09/22/2017 0829   GFRNONAA >  60 12/29/2019 1352   GFRNONAA >60 02/06/2019 1215   GFRAA >60 12/29/2019 1352   GFRAA >60 02/06/2019 1215    No results found for: SPEP, UPEP  Lab Results  Component Value Date   WBC 5.6 12/29/2019   NEUTROABS 3.7 12/29/2019   HGB 13.4 12/29/2019   HCT 40.5 12/29/2019   MCV 91.2 12/29/2019   PLT 291 12/29/2019      Chemistry      Component Value Date/Time   NA 142 12/29/2019 1352   NA 139 09/22/2017 0829   K 3.7 12/29/2019 1352   K 3.5 09/22/2017 0829   CL 104 12/29/2019 1352   CO2 28 12/29/2019 1352   CO2 26 09/22/2017 0829   BUN 15 12/29/2019 1352   BUN 14.1 09/22/2017 0829   CREATININE 1.07 12/29/2019 1352   CREATININE 0.93 02/06/2019 1215   CREATININE 0.9 09/22/2017 0829      Component Value Date/Time   CALCIUM 9.3 12/29/2019 1352   CALCIUM 9.1 09/22/2017 0829   ALKPHOS 72 12/29/2019 1352   ALKPHOS 55 09/22/2017 0829   AST 29 12/29/2019 1352   AST 31 02/06/2019 1215   AST 22 09/22/2017 0829   ALT 56 (H) 12/29/2019 1352   ALT 48 (H) 02/06/2019 1215   ALT 30 09/22/2017 0829   BILITOT 0.3 12/29/2019 1352   BILITOT 0.2 (L) 02/06/2019 1215   BILITOT 0.35 09/22/2017 0923

## 2020-01-05 NOTE — Assessment & Plan Note (Signed)
He has profound recurrent nausea and vomiting despite aggressive supportive care I recommend the patient to treat take a treatment break and he agrees

## 2020-01-12 ENCOUNTER — Ambulatory Visit: Payer: Medicaid Other

## 2020-01-12 ENCOUNTER — Ambulatory Visit: Payer: Medicaid Other | Admitting: Hematology and Oncology

## 2020-01-12 ENCOUNTER — Other Ambulatory Visit: Payer: Medicaid Other

## 2020-01-12 ENCOUNTER — Telehealth: Payer: Self-pay | Admitting: *Deleted

## 2020-01-12 NOTE — Telephone Encounter (Signed)
[place] Work  Clinical Social Work was referred by medical oncology RN for assessment of psychosocial needs.  Clinical Social Worker contacted patient by phone to offer support and assess for needs.  Patient indicated his spouse can speak to needs as she is pregnant and unable to work.  CSW spoke with patient's spouse- she indicated she is unable to work due to pregnancy complications.  She is inquiring about Medicaid.  They have limited support and resources.  CSW reported she would explore resources available to patient and follow up with them.   Gwinda Maine, LCSW  Clinical Social Worker [facility]

## 2020-01-18 ENCOUNTER — Telehealth: Payer: Self-pay

## 2020-01-18 ENCOUNTER — Other Ambulatory Visit: Payer: Self-pay | Admitting: Hematology and Oncology

## 2020-01-18 MED ORDER — OXYCODONE HCL 10 MG PO TABS: 10.0000 mg | ORAL_TABLET | Freq: Four times a day (QID) | ORAL | 0 refills | Status: DC | PRN

## 2020-01-18 NOTE — Telephone Encounter (Signed)
Attempted to call. The mailbox is full.

## 2020-01-18 NOTE — Telephone Encounter (Signed)
He called and requested a refill on Oxycodone. He is feeling great, healthy and happy. He is enjoying the break.

## 2020-01-18 NOTE — Telephone Encounter (Signed)
done

## 2020-02-01 ENCOUNTER — Telehealth: Payer: Self-pay | Admitting: Hematology and Oncology

## 2020-02-01 ENCOUNTER — Telehealth: Payer: Self-pay

## 2020-02-01 ENCOUNTER — Other Ambulatory Visit: Payer: Self-pay | Admitting: Hematology and Oncology

## 2020-02-01 MED ORDER — OXYCODONE HCL 10 MG PO TABS: 10.0000 mg | ORAL_TABLET | Freq: Four times a day (QID) | ORAL | 0 refills | Status: DC | PRN

## 2020-02-01 NOTE — Telephone Encounter (Signed)
TC from Pt. Inquiring about medication refill for oxycodone. Informed Pt Dr. Alvy Bimler will refill medication. Pt. Also asked appointment be rescheduled for next week. Pt. Stated his son has a doctors appointment on this day. Scheduling message sent.

## 2020-02-01 NOTE — Telephone Encounter (Signed)
rescheduled appt per 4/29 sch message - unable to reach pt - left message with appt date and time

## 2020-02-02 ENCOUNTER — Inpatient Hospital Stay: Payer: Medicaid Other | Admitting: Hematology and Oncology

## 2020-02-08 MED FILL — Dexamethasone Sodium Phosphate Inj 100 MG/10ML: INTRAMUSCULAR | Qty: 1 | Status: AC

## 2020-02-09 ENCOUNTER — Other Ambulatory Visit: Payer: Self-pay

## 2020-02-09 ENCOUNTER — Encounter: Payer: Self-pay | Admitting: Hematology and Oncology

## 2020-02-09 ENCOUNTER — Inpatient Hospital Stay: Payer: Medicaid Other | Attending: Hematology and Oncology | Admitting: Hematology and Oncology

## 2020-02-09 DIAGNOSIS — C119 Malignant neoplasm of nasopharynx, unspecified: Secondary | ICD-10-CM | POA: Diagnosis present

## 2020-02-09 DIAGNOSIS — C760 Malignant neoplasm of head, face and neck: Secondary | ICD-10-CM

## 2020-02-09 DIAGNOSIS — G893 Neoplasm related pain (acute) (chronic): Secondary | ICD-10-CM | POA: Diagnosis not present

## 2020-02-09 DIAGNOSIS — Z7189 Other specified counseling: Secondary | ICD-10-CM

## 2020-02-09 NOTE — Progress Notes (Signed)
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[place]) Unfortunately, on exam, it appears that the skin lesion on the right side of his neck is slightly worse compared to previous exam He is also experiencing more pain I recommend him to resume chemotherapy with single agent gemcitabine He is undecided He is willing to come back again in 2 weeks for further discussion about plan of care and I will tentatively schedule him for repeat blood work, see me in treatment day  Cancer associated pain He is overall, on exam, it appears that his disease is growing again He will continue oxycodone as prescribed We discussed narcotic refill policy  Goals of care, counseling/discussion He is undecided about treatment. He understood that treatment goal is palliative in nature.   No orders of the defined types were placed in this encounter.   All questions were answered. The patient knows to call the clinic with any problems, questions or concerns. The total time spent in the appointment was 20 minutes encounter with patients including review of chart and various tests results, discussions about plan of care and coordination of care plan   [name], MD 02/09/2020 10:46 AM  INTERVAL HISTORY: Please see below for problem oriented charting. 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 Pan-endoscopy elsewhere showed cancer from nasopharyngeal space.   01/04/2007 - 02/20/2007 Chemotherapy   He received 2 cycles of cisplatin and 5FU followed by concurrent chemo with weekly cisplatin and radiation. He only received 2 doses of chemo due to severe mucositis, nausea and weight loss.   04/05/2007 - 08/04/2007 Chemotherapy   He received 4 more courses of cisplatin with 5FU and had complete response   07/05/2009 Procedure   Fine-needle aspirate of the right level II lymph nodes come from recurrent metastatic disease. Repeat endoscopy and CT scan show no evidence of disease elsewhere.   07/08/2009 - 12/02/2009 Chemotherapy   He was given 6 cycles of carboplatin, 5-FU and docetaxel   12/03/2009 Surgery   He has surgery to the residual lymph node on the right neck which showed no evidence of disease.   02/22/2012 Imaging   Repeat imaging study showed large recurrent mass. He was referred elsewhere for further treatment.   05/03/2012 Surgery   He underwent left upper lobectomy.   04/29/2013 Imaging   PEt scan showed lesion on right level  II B and lower lung was abnormal   06/03/2013 - 02/02/2014 Chemotherapy   He had 6 cycles of chemotherapy when he was found to have recurrence of cancer and had received oxaliplatin and capecitabine   06/07/2014 Imaging   PET CT scan showed persistent disease in the right neck lymph nodes and left lung   06/29/2014  Procedure   Accession: [id number] repeat LUL biopsy confirmed metastatic cancer   07/18/2014 - 07/31/2014 Radiation Therapy   He received palliative radiation therapy to the lungs   10/10/2014 Imaging   CT scan of the chest, abdomen and pelvis show regression in the size of the lung nodule in the left upper lobe and stable pulmonary nodules   01/24/2015 Imaging   CT scan showed stable disease in neck and lung   06/19/2015 Imaging   CT scan of the neck and the chest show possible mild progression of the nodule in the right side of the neck.   06/25/2015 Imaging   PET scan confirmed disease recurrence in the neck   07/07/2015 Imaging   He had MRI neck at [facility]   09/03/2015 - 08/26/2018 Chemotherapy   He received palliative chemo with Nivolumab   10/29/2015 Imaging   PET CT showed positive response to Rx   02/28/2016 Imaging   Ct abdomen showed abnormal thinkening in his stomach   03/03/2016 Imaging   CT: Right sternocleidomastoid muscle metastasis appears less distinct but otherwise not significantly changed in size or configuration since 06/19/2015.2. Left level 3 lymph node which was hypermetabolic by PET-CT in January 2017 appears slightly smaller   04/01/2016 Imaging   CT cervical spine showed no acute fracture or traumatic malalignment in the cervical spine   04/22/2016 Procedure   Port-a-cath placed.   06/16/2016 Imaging   Ct neck showed right sternocleidomastoid muscle metastasis is further decreased in conspicuity since May, and has mildly decreased in size since September 2016. Continued stability of sub-centimeter left cervical lymph nodes. No new or progressive metastatic disease in the neck.   06/16/2016 Imaging   CT chest showed stable masslike radiation fibrosis in the left upper lobe. Stable subcentimeter pulmonary nodules in the bilateral lower lobes. No new or progressive metastatic disease in the chest. Nonobstructing left renal stone.   10/13/2016 Imaging   Ct neck  showed unchanged right sternocleidomastoid muscle metastasis. Unchanged subcentimeter left cervical lymph nodes. No evidence of new or progressive metastatic disease in the neck.   10/13/2016 Imaging   CT chest showed tiny hypervascular foci in the liver, not definitely seen on prior imaging of 06/16/2016 and 02/28/2016. Abdomen MRI without and with contrast recommended to further evaluate as metastatic disease is a concern. 2. Stable appearance of post treatment changes left upper lung and scattered tiny bilateral pulmonary nodules.   02/11/2017 Imaging   Ct neck: Lymph node mass right posterior neck appears improved from the prior study. Small posterior lymph nodes on the left unchanged. Occluded right jugular vein unchanged.   02/11/2017 Imaging   1. Similar appearance of postsurgical and radiation changes in the left upper lobe. 2. Similar bilateral pulmonary nodules. 3. No thoracic adenopathy. 4. Subtle foci of post-contrast enhancement within the liver are suboptimally characterized on this nondedicated study. Likely similar. These could either be re-evaluated at followup or more entirely characterized with abdominal MRI. 5. Left nephrolithiasis.   05/19/2017 Imaging   Matted lymph node mass right posterior neck appears larger in the recent CT. Accurate measurements difficult due to infiltrating tumor margins and infiltration  of the muscle. Right jugular vein again appears occluded or resected. Small left posterior lymph nodes stable. Left upper lobe airspace density stable and similar to the prior CT   06/03/2017 PET scan   1. Hypermetabolic ill-defined right level IIb lymph node, about 1.3 cm in diameter with maximum SUV 9.5 (formerly 8.1). Appearance suspicious for residual/recurrent malignancy. No worrisome left-sided lesion. 2. Left suprahilar indistinct opacity demonstrates no worrisome hypermetabolic activity. The 5 mm left lower lobe pulmonary nodule is stable and not currently  hypermetabolic although below sensitive PET-CT size thresholds. 3. Other imaging findings of potential clinical significance: Bilateral nonobstructive nephrolithiasis. Chronic bilateral maxillary sinusitis.   05/11/2018 PET scan   1. Continued chronic accentuated metabolic activity in the vicinity of right level IIB and the adjacent right sternocleidomastoid muscle, with ill definition of surrounding tissue planes. Maximum SUV is currently 8.1, formerly 9.5. Accentuated metabolic activity is been present in this vicinity back through 06/25/2015, and there was also some low-level activity in this vicinity on 06/07/2014. Some of this may be from scarring and local muscular activity although clearly a component of residual tumor is difficult to exclude given the focally high activity. 2. Other imaging findings of potential clinical significance: Chronic bilateral maxillary sinusitis. Chronic scarring in the left upper lobe. Chronically stable 5 mm left lower lobe nodule is considered benign. Nonobstructive left nephrolithiasis.   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 COMMENT:No significant keratinization is identified. Other basaloid carcinomas are in the differential diagnosis. No cell block material is available for further testing.   09/12/2018 Procedure   He underwent fine Needle Aspiration   10/04/2018 PET scan   1. Significant progression of local recurrence laterally in the mid right neck with an enlarging, increasingly hypermetabolic soft tissue mass. This involves the right sternocleidomastoid muscle. 2. Small lymph nodes in the right axilla are increasingly hypermetabolic. These are nonspecific and potentially reactive, although could reflect a small metastases. Small hypermetabolic nodule in the left suprasternal notch is unchanged. 3. No  other evidence of metastatic disease.    10/07/2018 - 12/23/2018 Chemotherapy   The patient had cisplatin plus gemzar   12/07/2018 Imaging   1. Decreased size of lateral right neck mass. 2. Unchanged soft tissue nodule in the suprasternal notch. 3. No evidence of new metastatic disease in the neck.    05/30/2019 Imaging   CT neck No clear change or progression compared to the study of March. Overall measurements of the right lateral neck mass are similar, approximately 3 x 1.8 cm. See above discussion. One could argue that there is slight increase in lateral bulging, possibly with an increase in contrast enhancement, towards the inferior margin. This is of questionable validity but could possibly represent some progression or inflammatory change. Other findings in the region are stable.   07/20/2019 - 09/28/2019 Chemotherapy   The patient had palonosetron (ALOXI) injection 0.25 mg, 0.25 mg, Intravenous,  Once, 3 of 4 cycles Administration: 0.25 mg (07/20/2019), 0.25 mg (08/10/2019), 0.25 mg (09/08/2019) CISplatin (PLATINOL) 84 mg in sodium chloride 0.9 % 250 mL chemo infusion, 40 mg/m2 = 84 mg (80 % of original dose 50 mg/m2), Intravenous,  Once, 3 of 4 cycles Dose modification: 40 mg/m2 (80 % of original dose 50 mg/m2, Cycle 1, Reason: Dose Not Tolerated) Administration: 84 mg (07/20/2019), 84 mg (08/10/2019), 83 mg (09/08/2019) gemcitabine (GEMZAR) 1,600 mg in sodium chloride 0.9 %  250 mL chemo infusion, 1,672 mg (80 % of original dose 1,000 mg/m2), Intravenous,  Once, 3 of 4 cycles Dose modification: 800 mg/m2 (80 % of original dose 1,000 mg/m2, Cycle 1, Reason: Provider Judgment) Administration: 1,600 mg (07/20/2019), 1,600 mg (07/27/2019), 1,600 mg (08/10/2019), 1,600 mg (08/17/2019), 1,600 mg (09/08/2019), 1,672 mg (09/15/2019) ondansetron (ZOFRAN) 8 mg, dexamethasone (DECADRON) 10 mg in sodium chloride 0.9 % 50 mL IVPB, , Intravenous,  Once, 3 of 4 cycles Administration:   (09/15/2019) fosaprepitant (EMEND) 150 mg, dexamethasone (DECADRON) 12 mg in sodium chloride 0.9 % 145 mL IVPB, , Intravenous,  Once, 3 of 4 cycles Administration:  (07/20/2019),  (08/10/2019),  (09/08/2019)  for chemotherapy treatment.    10/02/2019 Imaging   CT neck As compared to 05/30/2019, no significant interval change in size of an ill-defined mass within the right lateral neck, again measuring 3.3 x 1.8 cm in transaxial dimensions.   Unchanged mildly enlarged left level I lymph node measuring 1.1 cm in short axis.   Unchanged node or nodule at the thoracic inlet, measuring 1.3 x 0.8 cm.   Please refer to concurrently performed chest CT for a description of findings below the level of the thoracic inlet.     10/02/2019 Imaging   CT chest 1. No new or progressive findings in the chest to suggest metastatic disease. 2. Bilateral subcentimeter solid pulmonary nodules are stable since 2018. 3. Hyperdense 1.1 cm anterior liver focus, not clearly visualized on prior studies. Suggest MRI abdomen without and with IV contrast for further characterization.   10/20/2019 -  Chemotherapy   The patient had ondansetron (ZOFRAN) injection 8 mg, 8 mg (100 % of original dose 8 mg), Intravenous,  Once, 2 of 5 cycles Dose modification: 8 mg (original dose 8 mg, Cycle 2) Administration: 8 mg (11/17/2019), 8 mg (12/15/2019), 8 mg (12/29/2019) gemcitabine (GEMZAR) 2,000 mg in sodium chloride 0.9 % 250 mL chemo infusion, 2,090 mg, Intravenous,  Once, 3 of 6 cycles Administration: 2,000 mg (10/20/2019), 2,000 mg (11/03/2019), 2,000 mg (11/17/2019), 2,000 mg (12/15/2019), 2,000 mg (12/29/2019)  for chemotherapy treatment.      REVIEW OF SYSTEMS:   Constitutional: Denies fevers, chills or abnormal weight loss Eyes: Denies blurriness of vision Ears, nose, mouth, throat, and face: Denies mucositis or sore throat Respiratory: Denies cough, dyspnea or wheezes Cardiovascular: Denies palpitation, chest discomfort or  lower extremity swelling Gastrointestinal:  Denies nausea, heartburn or change in bowel habits Skin: Denies abnormal skin rashes Lymphatics: Denies new lymphadenopathy or easy bruising Neurological:Denies numbness, tingling or new weaknesses Behavioral/Psych: Mood is stable, no new changes  All other systems were reviewed with the patient and are negative.  I have reviewed the past medical history, past surgical history, social history and family history with the patient and they are unchanged from previous note.  ALLERGIES:  is allergic to phenergan [promethazine hcl]; heparin; and clindamycin.  MEDICATIONS:  Current Outpatient Medications  Medication Sig Dispense Refill  . atorvastatin (LIPITOR) 40 MG tablet TK 1 T PO  D    . cetirizine (ZYRTEC) 10 MG tablet Take 1 tablet (10 mg total) by mouth daily. 30 tablet 1  . ibuprofen (ADVIL) 600 MG tablet TK 1 T PO Q 6 H PRN    . levothyroxine (SYNTHROID) 137 MCG tablet Take 1 tablet (137 mcg total) by mouth daily before breakfast. 90 tablet 9  . lidocaine (XYLOCAINE) 2 % solution Use as directed 5 mLs in the mouth or throat every 3 (three) hours as needed for  mouth pain. Swish, gargle and spit 100 mL 2  . LORazepam (ATIVAN) 1 MG tablet Take 1 tablet (1 mg total) by mouth every 8 (eight) hours as needed for anxiety. 30 tablet 3  . omeprazole (PRILOSEC) 20 MG capsule Take 1 capsule (20 mg total) by mouth daily. 30 capsule 11  . ondansetron (ZOFRAN) 8 MG tablet Take 1 tablet (8 mg total) by mouth every 8 (eight) hours as needed for nausea or vomiting. 90 tablet 1  . Oxycodone HCl 10 MG TABS Take 1 tablet (10 mg total) by mouth every 6 (six) hours as needed. 60 tablet 0  . PARoxetine (PAXIL) 20 MG tablet Take 1 tablet (20 mg total) by mouth daily. 30 tablet 5  . polyethylene glycol (MIRALAX) packet Take 17 g by mouth daily. (Patient taking differently: Take 17 g by mouth daily as needed for mild constipation or moderate constipation. ) 14 each 3  .  prochlorperazine (COMPAZINE) 10 MG tablet Take 1 tablet (10 mg total) by mouth every 12 (twelve) hours as needed (Nausea or vomiting). 30 tablet 1  . senna-docusate (SENOKOT-S) 8.6-50 MG tablet Take 2 tablets by mouth 3 (three) times daily. 90 tablet 0  . Vitamin D, Ergocalciferol, (DRISDOL) 1.25 MG (50000 UT) CAPS capsule TK 1 C PO WEEKLY     No current facility-administered medications for this visit.   Facility-Administered Medications Ordered in Other Visits  Medication Dose Route Frequency Provider Last Rate Last Admin  . anticoagulant sodium citrate solution 5 mL  5 mL Intracatheter Once Alvy Bimler, Jameir Ake, MD      . anticoagulant sodium citrate solution 5 mL  5 mL Intracatheter Once Alvy Bimler, Princeston Blizzard, MD        PHYSICAL EXAMINATION: ECOG PERFORMANCE STATUS: 1 - Symptomatic but completely ambulatory  Vitals:   02/09/20 0905  BP: 123/77  Pulse: 72  Resp: 18  Temp: 98 F (36.7 C)  SpO2: 100%   Filed Weights   02/09/20 0905  Weight: 191 lb 1.6 oz (86.7 kg)    GENERAL:alert, no distress and comfortable SKIN: skin color, texture, turgor are normal, no rashes or significant lesions EYES: normal, Conjunctiva are pink and non-injected, sclera clear OROPHARYNX:no exudate, no erythema and lips, buccal mucosa, and tongue normal  NECK: Significant the right sided neck mass has grown a little bit since last visit LYMPH:  no palpa ble lymphadenopathy in the cervical, axillary or inguinal LUNGS: clear to auscultation and percussion with normal breathing effort HEART: regular rate & rhythm and no murmurs and no lower extremity edema ABDOMEN:abdomen soft, non-tender and normal bowel sounds Musculoskeletal:no cyanosis of digits and no clubbing  NEURO: alert & oriented x 3 with fluent speech, no focal motor/sensory deficits  LABORATORY DATA:  I have reviewed the data as listed    Component Value Date/Time   NA 142 12/29/2019 1352   NA 139 09/22/2017 0829   K 3.7 12/29/2019 1352   K 3.5  09/22/2017 0829   CL 104 12/29/2019 1352   CO2 28 12/29/2019 1352   CO2 26 09/22/2017 0829   GLUCOSE 142 (H) 12/29/2019 1352   GLUCOSE 133 09/22/2017 0829   BUN 15 12/29/2019 1352   BUN 14.1 09/22/2017 0829   CREATININE 1.07 12/29/2019 1352   CREATININE 0.93 02/06/2019 1215   CREATININE 0.9 09/22/2017 0829   CALCIUM 9.3 12/29/2019 1352   CALCIUM 9.1 09/22/2017 0829   PROT 7.1 12/29/2019 1352   PROT 6.8 09/22/2017 0829   ALBUMIN 4.1 12/29/2019 1352   ALBUMIN  4.1 09/22/2017 0829   AST 29 12/29/2019 1352   AST 31 02/06/2019 1215   AST 22 09/22/2017 0829   ALT 56 (H) 12/29/2019 1352   ALT 48 (H) 02/06/2019 1215   ALT 30 09/22/2017 0829   ALKPHOS 72 12/29/2019 1352   ALKPHOS 55 09/22/2017 0829   BILITOT 0.3 12/29/2019 1352   BILITOT 0.2 (L) 02/06/2019 1215   BILITOT 0.35 09/22/2017 0829   GFRNONAA >60 12/29/2019 1352   GFRNONAA >60 02/06/2019 1215   GFRAA >60 12/29/2019 1352   GFRAA >60 02/06/2019 1215    No results found for: SPEP, UPEP  Lab Results  Component Value Date   WBC 5.6 12/29/2019   NEUTROABS 3.7 12/29/2019   HGB 13.4 12/29/2019   HCT 40.5 12/29/2019   MCV 91.2 12/29/2019   PLT 291 12/29/2019      Chemistry      Component Value Date/Time   NA 142 12/29/2019 1352   NA 139 09/22/2017 0829   K 3.7 12/29/2019 1352   K 3.5 09/22/2017 0829   CL 104 12/29/2019 1352   CO2 28 12/29/2019 1352   CO2 26 09/22/2017 0829   BUN 15 12/29/2019 1352   BUN 14.1 09/22/2017 0829   CREATININE 1.07 12/29/2019 1352   CREATININE 0.93 02/06/2019 1215   CREATININE 0.9 09/22/2017 0829      Component Value Date/Time   CALCIUM 9.3 12/29/2019 1352   CALCIUM 9.1 09/22/2017 0829   ALKPHOS 72 12/29/2019 1352   ALKPHOS 55 09/22/2017 0829   AST 29 12/29/2019 1352   AST 31 02/06/2019 1215   AST 22 09/22/2017 0829   ALT 56 (H) 12/29/2019 1352   ALT 48 (H) 02/06/2019 1215   ALT 30 09/22/2017 0829   BILITOT 0.3 12/29/2019 1352   BILITOT 0.2 (L) 02/06/2019 1215   BILITOT 0.35  09/22/2017 0998

## 2020-02-09 NOTE — Assessment & Plan Note (Signed)
Unfortunately, on exam, it appears that the skin lesion on the right side of his neck is slightly worse compared to previous exam He is also experiencing more pain I recommend him to resume chemotherapy with single agent gemcitabine He is undecided He is willing to come back again in 2 weeks for further discussion about plan of care and I will tentatively schedule him for repeat blood work, see me in treatment day

## 2020-02-09 NOTE — Assessment & Plan Note (Signed)
He is undecided about treatment. He understood that treatment goal is palliative in nature.

## 2020-02-09 NOTE — Assessment & Plan Note (Signed)
He is overall, on exam, it appears that his disease is growing again He will continue oxycodone as prescribed We discussed narcotic refill policy

## 2020-02-12 ENCOUNTER — Telehealth: Payer: Self-pay | Admitting: Hematology and Oncology

## 2020-02-12 NOTE — Telephone Encounter (Signed)
Scheduled appt per 5/7 sch message- unable to reach pt . Left message with appt date and time

## 2020-02-13 IMAGING — CR DG CHEST 2V
2 series · 2 of 2 positions shown · non-contrast
Comparison: PET-CT 10/04/2018

CLINICAL DATA: Chest pain. Anxiety. History of nasopharyngeal
cancer metastasized to lung.

EXAM:
CHEST - 2 VIEW

[w chest pa]
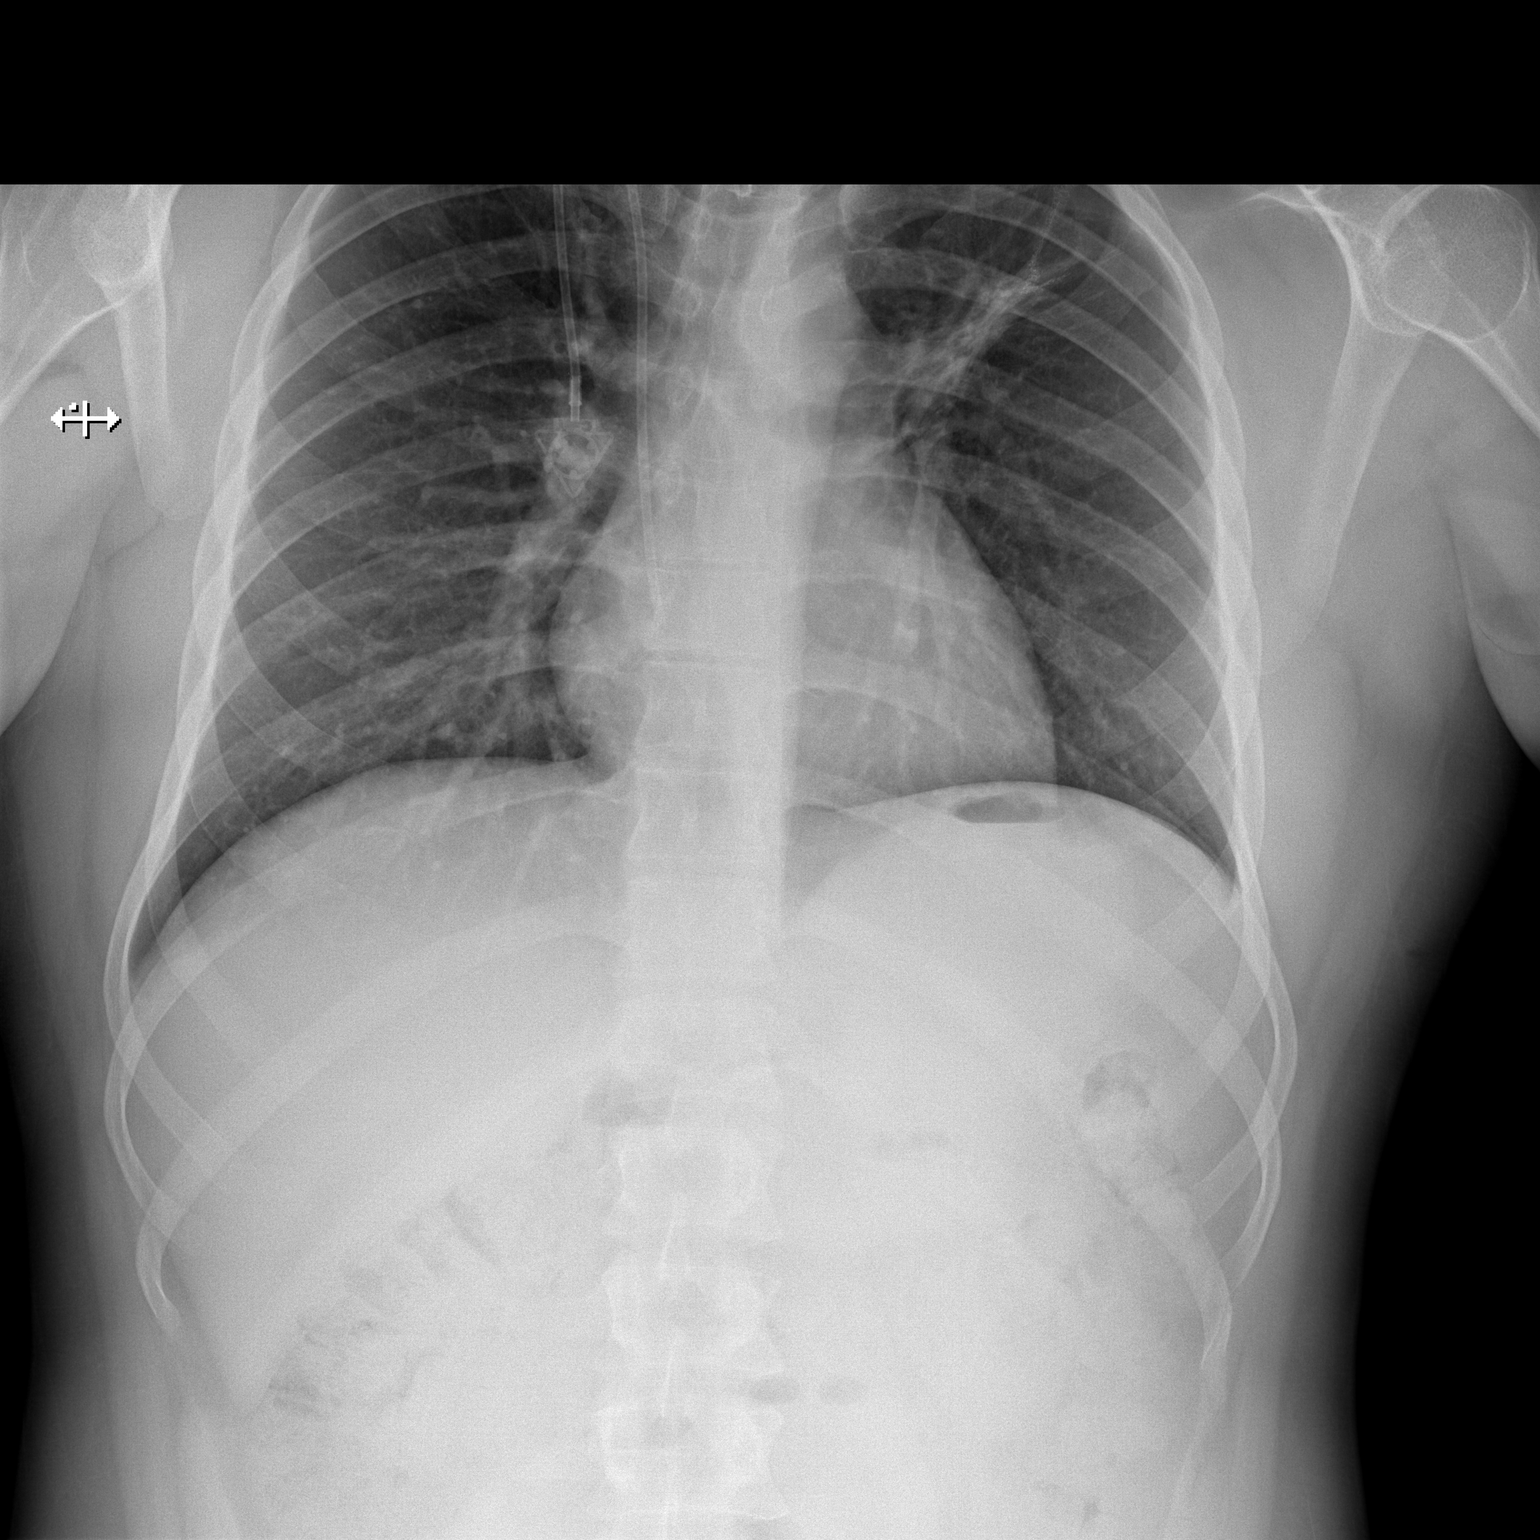

[w chest lat]
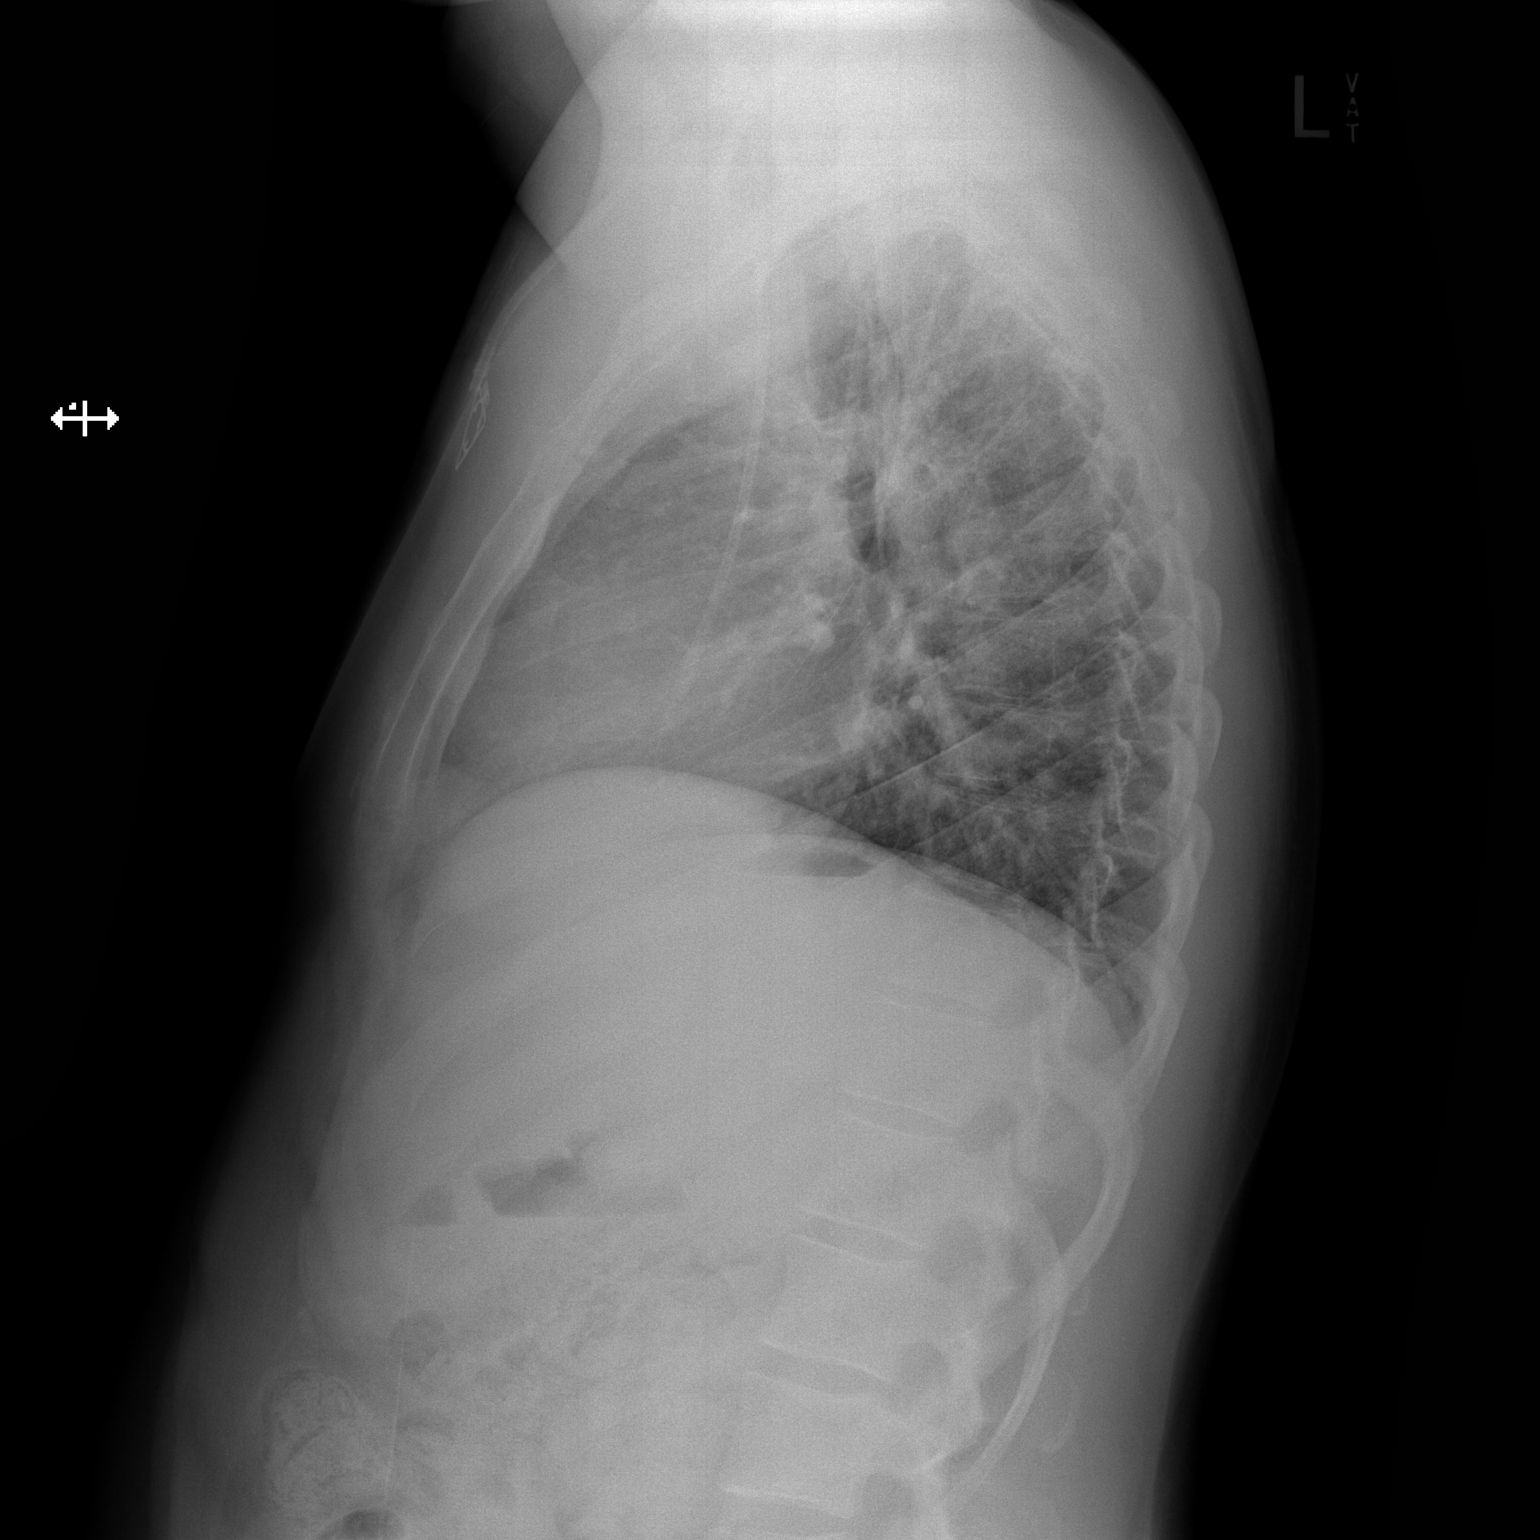

[2 of 2 positions shown; findings below may reference images not displayed]

FINDINGS: Right chest port with tip at the atrial caval junction. Heart is
normal in size. Normal mediastinal contours. Left upper lobe
scarring. No acute airspace disease. No pleural effusion or
pneumothorax. No pulmonary edema. Small pulmonary nodule on CT is
not seen radiographically. No acute osseous abnormalities are seen.
IMPRESSION: No acute chest findings.  Left upper lobe scarring.

## 2020-02-19 ENCOUNTER — Other Ambulatory Visit: Payer: Self-pay | Admitting: Hematology and Oncology

## 2020-02-19 ENCOUNTER — Telehealth: Payer: Self-pay

## 2020-02-19 MED ORDER — OXYCODONE HCL 10 MG PO TABS: 10.0000 mg | ORAL_TABLET | Freq: Four times a day (QID) | ORAL | 0 refills | Status: DC | PRN

## 2020-02-19 NOTE — Telephone Encounter (Signed)
Called patient and let him know that Oxycodone has been refilled by Dr Alvy Bimler now

## 2020-02-19 NOTE — Telephone Encounter (Signed)
Received TC from patient stating that he needs a refill on his oxycodone medication. Stated that he only has 3 pills left and is in pain. Dr Alvy Bimler made aware of patients request.

## 2020-02-19 NOTE — Progress Notes (Signed)
Pharmacist Chemotherapy Monitoring - Follow Up Assessment    I verify that I have reviewed each item in the below checklist:  . Regimen for the patient is scheduled for the appropriate day and plan matches scheduled date. Marland Kitchen Appropriate non-routine labs are ordered dependent on drug ordered. . If applicable, additional medications reviewed and ordered per protocol based on lifetime cumulative doses and/or treatment regimen.   Plan for follow-up and/or issues identified: No . I-vent associated with next due treatment: No . MD and/or nursing notified: No  Catori Panozzo D 02/19/2020 12:28 PM

## 2020-02-20 ENCOUNTER — Telehealth: Payer: Self-pay

## 2020-02-20 NOTE — Telephone Encounter (Signed)
I can do e visit 820 am on Thursday morning Can you set it up?

## 2020-02-20 NOTE — Telephone Encounter (Signed)
He called and left a message. Something has come up.  He needs a earlier appt with Dr. Alvy Bimler prior to Fridays appts. Or he can talk with Dr. Alvy Bimler on the phone.

## 2020-02-20 NOTE — Telephone Encounter (Signed)
Called and given appt for Thursday 5/20 at 8:40 am for telephone visit. He verbalized understanding. Appt scheduled.

## 2020-02-21 MED FILL — Dexamethasone Sodium Phosphate Inj 100 MG/10ML: INTRAMUSCULAR | Qty: 1 | Status: AC

## 2020-02-22 ENCOUNTER — Encounter: Payer: Self-pay | Admitting: Hematology and Oncology

## 2020-02-22 ENCOUNTER — Inpatient Hospital Stay (HOSPITAL_BASED_OUTPATIENT_CLINIC_OR_DEPARTMENT_OTHER): Payer: Medicaid Other | Admitting: Hematology and Oncology

## 2020-02-22 DIAGNOSIS — G893 Neoplasm related pain (acute) (chronic): Secondary | ICD-10-CM

## 2020-02-22 DIAGNOSIS — C760 Malignant neoplasm of head, face and neck: Secondary | ICD-10-CM | POA: Diagnosis not present

## 2020-02-22 NOTE — Assessment & Plan Note (Signed)
He has poorly controlled pain When he is due for pain medicine refill at the end of the month, I plan to increase the dose of oxycodone to 15 mg as needed He is in agreement with For now, I recommend he adds acetaminophen along with his oxycodone for better pain management

## 2020-02-22 NOTE — Progress Notes (Signed)
HEMATOLOGY-ONCOLOGY ELECTRONIC VISIT PROGRESS NOTE  Patient Care Team: Patient, No Pcp Per as PCP - General (General Practice) [name], MD as Consulting Physician (Hematology and Oncology) Eppie Gibson, MD as Attending Physician (Radiation Oncology) Jodi Marble, MD as Consulting Physician (Otolaryngology) Philomena Doheny, MD as Referring Physician (Plastic Surgery) Irene Shipper, MD as Consulting Physician (Gastroenterology) System, Provider Not In  I connected with by [facility] video conference  The visit is changed to a phone visit.  I also discussed with the patient that there may be a patient responsible charge related to this service. The patient expressed understanding and agreed to proceed.   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   INTERVAL HISTORY: Please see below for problem oriented charting. 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 Pan-endoscopy elsewhere showed cancer from nasopharyngeal space.   01/04/2007 - 02/20/2007 Chemotherapy   He received 2 cycles of cisplatin and 5FU followed by concurrent chemo with weekly cisplatin and radiation. He only received 2 doses of chemo due to severe mucositis, nausea and weight loss.   04/05/2007 - 08/04/2007 Chemotherapy   He received 4 more courses of cisplatin with 5FU and had complete response   07/05/2009 Procedure   Fine-needle aspirate of the right level II lymph nodes come from recurrent metastatic disease. Repeat endoscopy and CT scan show no evidence of disease elsewhere.   07/08/2009 - 12/02/2009 Chemotherapy   He was given 6 cycles of carboplatin, 5-FU and docetaxel   12/03/2009 Surgery   He has surgery to the residual lymph node on the right neck which showed no evidence  of disease.   02/22/2012 Imaging   Repeat imaging study showed large recurrent mass. He was referred elsewhere for further treatment.   05/03/2012 Surgery   He underwent left upper lobectomy.   04/29/2013 Imaging   PEt scan showed lesion on right level II B and lower lung was abnormal   06/03/2013 - 02/02/2014 Chemotherapy   He had 6 cycles of chemotherapy when he was found to have  recurrence of cancer and had received oxaliplatin and capecitabine   06/07/2014 Imaging   PET CT scan showed persistent disease in the right neck lymph nodes and left lung   06/29/2014 Procedure   Accession: [id number] repeat LUL biopsy confirmed metastatic cancer   07/18/2014 - 07/31/2014 Radiation Therapy   He received palliative radiation therapy to the lungs   10/10/2014 Imaging   CT scan of the chest, abdomen and pelvis show regression in the size of the lung nodule in the left upper lobe and stable pulmonary nodules   01/24/2015 Imaging   CT scan showed stable disease in neck and lung   06/19/2015 Imaging   CT scan of the neck and the chest show possible mild progression of the nodule in the right side of the neck.   06/25/2015 Imaging   PET scan confirmed disease recurrence in the neck   07/07/2015 Imaging   He had [facility] neck at [facility]   09/03/2015 - 08/26/2018 Chemotherapy   He received palliative chemo with Nivolumab   10/29/2015 Imaging   PET CT showed positive response to Rx   02/28/2016 Imaging   Ct abdomen showed abnormal thinkening in his stomach   03/03/2016 Imaging   CT: Right sternocleidomastoid muscle metastasis appears less distinct but otherwise not significantly changed in size or configuration since 06/19/2015.2. Left level 3 lymph node which was hypermetabolic by PET-CT in January 2017 appears slightly smaller   04/01/2016 Imaging   CT cervical spine showed no acute fracture or traumatic malalignment in the cervical spine   04/22/2016 Procedure   Port-a-cath placed.   06/16/2016 Imaging   Ct neck showed right sternocleidomastoid muscle metastasis is further decreased in conspicuity since May, and has mildly decreased in size since September 2016. Continued stability of sub-centimeter left cervical lymph nodes. No new or progressive metastatic disease in the neck.   06/16/2016 Imaging   CT chest showed stable masslike radiation fibrosis in the left upper lobe. Stable  subcentimeter pulmonary nodules in the bilateral lower lobes. No new or progressive metastatic disease in the chest. Nonobstructing left renal stone.   10/13/2016 Imaging   Ct neck showed unchanged right sternocleidomastoid muscle metastasis. Unchanged subcentimeter left cervical lymph nodes. No evidence of new or progressive metastatic disease in the neck.   10/13/2016 Imaging   CT chest showed tiny hypervascular foci in the liver, not definitely seen on prior imaging of 06/16/2016 and 02/28/2016. Abdomen MRI without and with contrast recommended to further evaluate as metastatic disease is a concern. 2. Stable appearance of post treatment changes left upper lung and scattered tiny bilateral pulmonary nodules.   02/11/2017 Imaging   Ct neck: Lymph node mass right posterior neck appears improved from the prior study. Small posterior lymph nodes on the left unchanged. Occluded right jugular vein unchanged.   02/11/2017 Imaging   1. Similar appearance of postsurgical and radiation changes in the left upper lobe. 2. Similar bilateral pulmonary nodules. 3. No thoracic adenopathy. 4. Subtle foci of post-contrast enhancement within the liver are suboptimally characterized on this nondedicated study. Likely similar.  These could either be re-evaluated at followup or more entirely characterized with abdominal MRI. 5. Left nephrolithiasis.   05/19/2017 Imaging   Matted lymph node mass right posterior neck appears larger in the recent CT. Accurate measurements difficult due to infiltrating tumor margins and infiltration of the muscle. Right jugular vein again appears occluded or resected. Small left posterior lymph nodes stable. Left upper lobe airspace density stable and similar to the prior CT   06/03/2017 PET scan   1. Hypermetabolic ill-defined right level IIb lymph node, about 1.3 cm in diameter with maximum SUV 9.5 (formerly 8.1). Appearance suspicious for residual/recurrent malignancy. No worrisome  left-sided lesion. 2. Left suprahilar indistinct opacity demonstrates no worrisome hypermetabolic activity. The 5 mm left lower lobe pulmonary nodule is stable and not currently hypermetabolic although below sensitive PET-CT size thresholds. 3. Other imaging findings of potential clinical significance: Bilateral nonobstructive nephrolithiasis. Chronic bilateral maxillary sinusitis.   05/11/2018 PET scan   1. Continued chronic accentuated metabolic activity in the vicinity of right level IIB and the adjacent right sternocleidomastoid muscle, with ill definition of surrounding tissue planes. Maximum SUV is currently 8.1, formerly 9.5. Accentuated metabolic activity is been present in this vicinity back through 06/25/2015, and there was also some low-level activity in this vicinity on 06/07/2014. Some of this may be from scarring and local muscular activity although clearly a component of residual tumor is difficult to exclude given the focally high activity. 2. Other imaging findings of potential clinical significance: Chronic bilateral maxillary sinusitis. Chronic scarring in the left upper lobe. Chronically stable 5 mm left lower lobe nodule is considered benign. Nonobstructive left nephrolithiasis.   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 COMMENT:No significant keratinization is identified. Other basaloid carcinomas are in the differential diagnosis. No cell block material is available for further testing.   09/12/2018 Procedure   He underwent fine Needle Aspiration   10/04/2018 PET scan   1. Significant progression of local recurrence laterally in the mid right neck with an enlarging, increasingly hypermetabolic soft tissue mass. This involves the right sternocleidomastoid muscle. 2. Small lymph nodes in the right axilla are increasingly  hypermetabolic. These are nonspecific and potentially reactive, although could reflect a small metastases. Small hypermetabolic nodule in the left suprasternal notch is unchanged. 3. No other evidence of metastatic disease.    10/07/2018 - 12/23/2018 Chemotherapy   The patient had cisplatin plus gemzar   12/07/2018 Imaging   1. Decreased size of lateral right neck mass. 2. Unchanged soft tissue nodule in the suprasternal notch. 3. No evidence of new metastatic disease in the neck.    05/30/2019 Imaging   CT neck No clear change or progression compared to the study of March. Overall measurements of the right lateral neck mass are similar, approximately 3 x 1.8 cm. See above discussion. One could argue that there is slight increase in lateral bulging, possibly with an increase in contrast enhancement, towards the inferior margin. This is of questionable validity but could possibly represent some progression or inflammatory change. Other findings in the region are stable.   07/20/2019 - 09/28/2019 Chemotherapy   The patient had palonosetron (ALOXI) injection 0.25 mg, 0.25 mg, Intravenous,  Once, 3 of 4 cycles Administration: 0.25 mg (07/20/2019), 0.25 mg (08/10/2019), 0.25 mg (09/08/2019) CISplatin (PLATINOL) 84 mg in sodium chloride 0.9 % 250 mL chemo infusion, 40 mg/m2 = 84 mg (80 % of  original dose 50 mg/m2), Intravenous,  Once, 3 of 4 cycles Dose modification: 40 mg/m2 (80 % of original dose 50 mg/m2, Cycle 1, Reason: Dose Not Tolerated) Administration: 84 mg (07/20/2019), 84 mg (08/10/2019), 83 mg (09/08/2019) gemcitabine (GEMZAR) 1,600 mg in sodium chloride 0.9 % 250 mL chemo infusion, 1,672 mg (80 % of original dose 1,000 mg/m2), Intravenous,  Once, 3 of 4 cycles Dose modification: 800 mg/m2 (80 % of original dose 1,000 mg/m2, Cycle 1, Reason: Provider Judgment) Administration: 1,600 mg (07/20/2019), 1,600 mg (07/27/2019), 1,600 mg (08/10/2019), 1,600 mg (08/17/2019), 1,600 mg (09/08/2019), 1,672  mg (09/15/2019) ondansetron (ZOFRAN) 8 mg, dexamethasone (DECADRON) 10 mg in sodium chloride 0.9 % 50 mL IVPB, , Intravenous,  Once, 3 of 4 cycles Administration:  (09/15/2019) fosaprepitant (EMEND) 150 mg, dexamethasone (DECADRON) 12 mg in sodium chloride 0.9 % 145 mL IVPB, , Intravenous,  Once, 3 of 4 cycles Administration:  (07/20/2019),  (08/10/2019),  (09/08/2019)  for chemotherapy treatment.    10/02/2019 Imaging   CT neck As compared to 05/30/2019, no significant interval change in size of an ill-defined mass within the right lateral neck, again measuring 3.3 x 1.8 cm in transaxial dimensions.   Unchanged mildly enlarged left level I lymph node measuring 1.1 cm in short axis.   Unchanged node or nodule at the thoracic inlet, measuring 1.3 x 0.8 cm.   Please refer to concurrently performed chest CT for a description of findings below the level of the thoracic inlet.     10/02/2019 Imaging   CT chest 1. No new or progressive findings in the chest to suggest metastatic disease. 2. Bilateral subcentimeter solid pulmonary nodules are stable since 2018. 3. Hyperdense 1.1 cm anterior liver focus, not clearly visualized on prior studies. Suggest MRI abdomen without and with IV contrast for further characterization.   10/20/2019 -  Chemotherapy   The patient had ondansetron (ZOFRAN) injection 8 mg, 8 mg (100 % of original dose 8 mg), Intravenous,  Once, 2 of 5 cycles Dose modification: 8 mg (original dose 8 mg, Cycle 2) Administration: 8 mg (11/17/2019), 8 mg (12/15/2019), 8 mg (12/29/2019) gemcitabine (GEMZAR) 2,000 mg in sodium chloride 0.9 % 250 mL chemo infusion, 2,090 mg, Intravenous,  Once, 3 of 6 cycles Administration: 2,000 mg (10/20/2019), 2,000 mg (11/03/2019), 2,000 mg (11/17/2019), 2,000 mg (12/15/2019), 2,000 mg (12/29/2019)  for chemotherapy treatment.      REVIEW OF SYSTEMS:   Constitutional: Denies fevers, chills or abnormal weight loss Eyes: Denies blurriness of vision Ears,  nose, mouth, throat, and face: Denies mucositis or sore throat Respiratory: Denies cough, dyspnea or wheezes Cardiovascular: Denies palpitation, chest discomfort Gastrointestinal:  Denies nausea, heartburn or change in bowel habits Skin: Denies abnormal skin rashes Lymphatics: Denies new lymphadenopathy or easy bruising Neurological:Denies numbness, tingling or new weaknesses Behavioral/Psych: Mood is stable, no new changes  Extremities: No lower extremity edema All other systems were reviewed with the patient and are negative.  I have reviewed the past medical history, past surgical history, social history and family history with the patient and they are unchanged from previous note.  ALLERGIES:  is allergic to phenergan [promethazine hcl]; heparin; and clindamycin.  MEDICATIONS:  Current Outpatient Medications  Medication Sig Dispense Refill  . atorvastatin (LIPITOR) 40 MG tablet TK 1 T PO  D    . cetirizine (ZYRTEC) 10 MG tablet Take 1 tablet (10 mg total) by mouth daily. 30 tablet 1  . ibuprofen (ADVIL) 600 MG tablet TK 1 T PO Q 6 H  PRN    . levothyroxine (SYNTHROID) 137 MCG tablet Take 1 tablet (137 mcg total) by mouth daily before breakfast. 90 tablet 9  . lidocaine (XYLOCAINE) 2 % solution Use as directed 5 mLs in the mouth or throat every 3 (three) hours as needed for mouth pain. Swish, gargle and spit 100 mL 2  . LORazepam (ATIVAN) 1 MG tablet Take 1 tablet (1 mg total) by mouth every 8 (eight) hours as needed for anxiety. 30 tablet 3  . omeprazole (PRILOSEC) 20 MG capsule Take 1 capsule (20 mg total) by mouth daily. 30 capsule 11  . ondansetron (ZOFRAN) 8 MG tablet Take 1 tablet (8 mg total) by mouth every 8 (eight) hours as needed for nausea or vomiting. 90 tablet 1  . Oxycodone HCl 10 MG TABS Take 1 tablet (10 mg total) by mouth every 6 (six) hours as needed. 60 tablet 0  . PARoxetine (PAXIL) 20 MG tablet Take 1 tablet (20 mg total) by mouth daily. 30 tablet 5  . polyethylene  glycol (MIRALAX) packet Take 17 g by mouth daily. (Patient taking differently: Take 17 g by mouth daily as needed for mild constipation or moderate constipation. ) 14 each 3  . prochlorperazine (COMPAZINE) 10 MG tablet Take 1 tablet (10 mg total) by mouth every 12 (twelve) hours as needed (Nausea or vomiting). 30 tablet 1  . senna-docusate (SENOKOT-S) 8.6-50 MG tablet Take 2 tablets by mouth 3 (three) times daily. 90 tablet 0  . Vitamin D, Ergocalciferol, (DRISDOL) 1.25 MG (50000 UT) CAPS capsule TK 1 C PO WEEKLY     No current facility-administered medications for this visit.   Facility-Administered Medications Ordered in Other Visits  Medication Dose Route Frequency Provider Last Rate Last Admin  . anticoagulant sodium citrate solution 5 mL  5 mL Intracatheter Once Alvy Bimler, Ni, MD      . anticoagulant sodium citrate solution 5 mL  5 mL Intracatheter Once Alvy Bimler, Ni, MD        PHYSICAL EXAMINATION: ECOG PERFORMANCE STATUS: 1 - Symptomatic but completely ambulatory  LABORATORY DATA:  I have reviewed the data as listed CMP Latest Ref Rng & Units 12/29/2019 12/15/2019 11/17/2019  Glucose 70 - 99 mg/dL 142(H) 106(H) 145(H)  BUN 6 - 20 mg/dL 15 12 13   Creatinine 0.61 - 1.24 mg/dL 1.07 0.90 0.88  Sodium 135 - 145 mmol/L 142 139 142  Potassium 3.5 - 5.1 mmol/L 3.7 3.3(L) 3.5  Chloride 98 - 111 mmol/L 104 104 104  CO2 22 - 32 mmol/L 28 25 28   Calcium 8.9 - 10.3 mg/dL 9.3 9.2 9.2  Total Protein 6.5 - 8.1 g/dL 7.1 7.1 7.0  Total Bilirubin 0.3 - 1.2 mg/dL 0.3 0.3 0.2(L)  Alkaline Phos 38 - 126 U/L 72 72 65  AST 15 - 41 U/L 29 26 52(H)  ALT 0 - 44 U/L 56(H) 33 90(H)    Lab Results  Component Value Date   WBC 5.6 12/29/2019   HGB 13.4 12/29/2019   HCT 40.5 12/29/2019   MCV 91.2 12/29/2019   PLT 291 12/29/2019   NEUTROABS 3.7 12/29/2019     I discussed the assessment and treatment plan with the patient. The patient was provided an opportunity to ask questions and all were answered. The  patient agreed with the plan and demonstrated an understanding of the instructions. The patient was advised to call back or seek an in-person evaluation if the symptoms worsen or if the condition fails to improve as anticipated.  I spent 15 minutes for the appointment reviewing test results, discuss management and coordination of care.  [name], MD 02/22/2020 9:06 AM

## 2020-02-22 NOTE — Assessment & Plan Note (Signed)
Aside from neck pain, he has nothing to suggest worsening disease control He would like to reschedule his treatment until next month when he is able to get more help with his social circumstances before returning back to the cancer center for treatment I think it is reasonable approach I do want him to come back tomorrow for port flush He will call next month to reschedule his treatment and appointment

## 2020-02-23 ENCOUNTER — Inpatient Hospital Stay: Payer: Medicaid Other

## 2020-02-23 ENCOUNTER — Other Ambulatory Visit: Payer: Self-pay

## 2020-02-23 ENCOUNTER — Inpatient Hospital Stay: Payer: Medicaid Other | Admitting: Hematology and Oncology

## 2020-02-23 DIAGNOSIS — C7800 Secondary malignant neoplasm of unspecified lung: Secondary | ICD-10-CM

## 2020-02-23 DIAGNOSIS — C119 Malignant neoplasm of nasopharynx, unspecified: Secondary | ICD-10-CM | POA: Diagnosis not present

## 2020-02-23 DIAGNOSIS — Z95828 Presence of other vascular implants and grafts: Secondary | ICD-10-CM

## 2020-02-23 DIAGNOSIS — C760 Malignant neoplasm of head, face and neck: Secondary | ICD-10-CM

## 2020-02-23 MED ORDER — SODIUM CHLORIDE 0.9% FLUSH
10.0000 mL | Freq: Once | INTRAVENOUS | Status: AC
  Administered 2020-02-23: 10 mL
  Filled 2020-02-23: qty 10

## 2020-02-23 NOTE — Patient Instructions (Signed)

## 2020-03-05 ENCOUNTER — Telehealth: Payer: Self-pay

## 2020-03-05 ENCOUNTER — Other Ambulatory Visit: Payer: Self-pay | Admitting: Hematology and Oncology

## 2020-03-05 DIAGNOSIS — G893 Neoplasm related pain (acute) (chronic): Secondary | ICD-10-CM

## 2020-03-05 DIAGNOSIS — C760 Malignant neoplasm of head, face and neck: Secondary | ICD-10-CM

## 2020-03-05 MED ORDER — OXYCODONE HCL 15 MG PO TABS: 15.0000 mg | ORAL_TABLET | Freq: Four times a day (QID) | ORAL | 0 refills | Status: DC | PRN

## 2020-03-05 NOTE — Telephone Encounter (Signed)
Pt called and left VM requesting refill and dose increase for Oxycodone. Pt request an increase to 15 mg. Forwarded this request to Dr Alvy Bimler.

## 2020-03-05 NOTE — Telephone Encounter (Signed)
Called pt to inform him Oxycodone has been sent in. Pt verbalized thanks and understanding.

## 2020-03-21 ENCOUNTER — Telehealth: Payer: Self-pay

## 2020-03-21 ENCOUNTER — Other Ambulatory Visit: Payer: Self-pay | Admitting: Hematology and Oncology

## 2020-03-21 MED ORDER — OXYCODONE HCL 15 MG PO TABS: 15.0000 mg | ORAL_TABLET | Freq: Four times a day (QID) | ORAL | 0 refills | Status: DC | PRN

## 2020-03-21 NOTE — Telephone Encounter (Signed)
He called and requested a refill on Oxycodone Rx. He said he called yesterday but was not sure if the message went thru.

## 2020-03-21 NOTE — Telephone Encounter (Signed)
I was not told about refill yesterday Refill sent now

## 2020-03-21 NOTE — Telephone Encounter (Signed)
Called and told Rx sent to pharmacy. He verbalized understanding.

## 2020-04-05 ENCOUNTER — Other Ambulatory Visit: Payer: Self-pay | Admitting: Medical

## 2020-04-05 ENCOUNTER — Telehealth: Payer: Self-pay

## 2020-04-05 MED ORDER — OXYCODONE HCL 15 MG PO TABS: 15.0000 mg | ORAL_TABLET | Freq: Four times a day (QID) | ORAL | 0 refills | Status: DC | PRN

## 2020-04-05 NOTE — Telephone Encounter (Signed)
He called and left a message requesting refill on Oxycodone Rx.  Called back and told him Rx sent to pharmacy by Sandi Mealy, PA. Ask him to call the office for questions.

## 2020-04-19 ENCOUNTER — Telehealth: Payer: Self-pay

## 2020-04-19 NOTE — Telephone Encounter (Signed)
Pt returned call to office. Informed pt that message has been sent to Dr Alvy Bimler regarding Rx refill for Oxycodone. Pt verbalized thanks and understanding.

## 2020-04-19 NOTE — Telephone Encounter (Signed)
Pt called requesting refill for Oxycodone. Message sent to Dr Alvy Bimler about this request. Attempted to return call to pt. No answer and VM box full. Will continue to try calling.

## 2020-04-22 ENCOUNTER — Telehealth: Payer: Self-pay

## 2020-04-22 ENCOUNTER — Other Ambulatory Visit: Payer: Self-pay | Admitting: Hematology and Oncology

## 2020-04-22 MED ORDER — OXYCODONE HCL 15 MG PO TABS: 15.0000 mg | ORAL_TABLET | Freq: Four times a day (QID) | ORAL | 0 refills | Status: DC | PRN

## 2020-04-22 NOTE — Telephone Encounter (Signed)
-----   Message from [name], MD sent at 04/22/2020  8:23 AM EDT ----- I sent refill ----- Message ----- From: Lennie Odor, LPN Sent: [id number]  12:05 PM EDT To: [name], MD  Pt is requesting refill on Oxycodone.

## 2020-04-22 NOTE — Telephone Encounter (Signed)
Called and left a message Rx sent to pharmacy. Ask him to call the office for questions.

## 2020-05-10 ENCOUNTER — Telehealth: Payer: Self-pay

## 2020-05-10 ENCOUNTER — Other Ambulatory Visit: Payer: Self-pay | Admitting: Hematology and Oncology

## 2020-05-10 MED ORDER — OXYCODONE HCL 15 MG PO TABS: 15.0000 mg | ORAL_TABLET | Freq: Four times a day (QID) | ORAL | 0 refills | Status: DC | PRN

## 2020-05-10 NOTE — Telephone Encounter (Signed)
Called and given below message. He verbalized understanding. 

## 2020-05-10 NOTE — Telephone Encounter (Signed)
He called and left a message. Requesting refill on Oxycodone Rx.

## 2020-05-10 NOTE — Telephone Encounter (Signed)
done

## 2020-05-27 ENCOUNTER — Other Ambulatory Visit: Payer: Self-pay | Admitting: Hematology and Oncology

## 2020-05-27 ENCOUNTER — Telehealth: Payer: Self-pay

## 2020-05-27 MED ORDER — OXYCODONE HCL 15 MG PO TABS: 15.0000 mg | ORAL_TABLET | Freq: Four times a day (QID) | ORAL | 0 refills | Status: DC | PRN

## 2020-05-27 NOTE — Telephone Encounter (Signed)
I can see him at 12 pm, 20 mins Can you schedule it? I will refill

## 2020-05-27 NOTE — Telephone Encounter (Signed)
He called and left a message. Requesting a refill of the Oxycodone Rx. He is requesting appt to see Dr. Alvy Bimler on 8/30.

## 2020-05-27 NOTE — Telephone Encounter (Signed)
Called and given below message. He verbalzied understanding.

## 2020-05-31 MED FILL — Dexamethasone Sodium Phosphate Inj 100 MG/10ML: INTRAMUSCULAR | Qty: 1 | Status: AC

## 2020-06-03 ENCOUNTER — Encounter: Payer: Self-pay | Admitting: Hematology and Oncology

## 2020-06-03 ENCOUNTER — Other Ambulatory Visit: Payer: Self-pay

## 2020-06-03 ENCOUNTER — Inpatient Hospital Stay: Payer: Medicaid Other

## 2020-06-03 ENCOUNTER — Inpatient Hospital Stay: Payer: Medicaid Other | Attending: Hematology and Oncology | Admitting: Hematology and Oncology

## 2020-06-03 VITALS — BP 127/94 | HR 85 | Temp 96.8°F | Resp 18 | Ht 69.0 in | Wt 200.8 lb

## 2020-06-03 DIAGNOSIS — C760 Malignant neoplasm of head, face and neck: Secondary | ICD-10-CM | POA: Diagnosis not present

## 2020-06-03 DIAGNOSIS — G893 Neoplasm related pain (acute) (chronic): Secondary | ICD-10-CM

## 2020-06-03 DIAGNOSIS — R112 Nausea with vomiting, unspecified: Secondary | ICD-10-CM | POA: Diagnosis not present

## 2020-06-03 DIAGNOSIS — C7800 Secondary malignant neoplasm of unspecified lung: Secondary | ICD-10-CM

## 2020-06-03 DIAGNOSIS — Z7189 Other specified counseling: Secondary | ICD-10-CM | POA: Diagnosis not present

## 2020-06-03 DIAGNOSIS — C119 Malignant neoplasm of nasopharynx, unspecified: Secondary | ICD-10-CM | POA: Insufficient documentation

## 2020-06-03 NOTE — Progress Notes (Signed)
DISCONTINUE OFF PATHWAY REGIMEN - Head and Neck   [id number]:Gemcitabine 1,000 mg/m2 Days 1, 8, 15 q28 Days:   A cycle is every 28 days (3 weeks on and 1 week off):     Gemcitabine   **Always confirm dose/schedule in your pharmacy ordering system**  REASON: Other Reason PRIOR TREATMENT: Off Pathway: Gemcitabine 1,000 mg/m2 Days 1, 8, 15 q28 Days TREATMENT RESPONSE: Stable Disease (SD)  START OFF PATHWAY REGIMEN - Head and Neck   [id number]:Carboplatin IV D1 + Fluorouracil CIV D1-4 + Pembrolizumab IV D1 q21 Days (C1-6) Followed by Pembrolizumab IV D1 q21 Days:   A cycle is every 21 days:     Carboplatin      Fluorouracil      Pembrolizumab   **Always confirm dose/schedule in your pharmacy ordering system**  Patient Characteristics: Nasopharyngeal, Metastatic, Third Line & Beyond, TMB-L/Unknown Disease Classification: Nasopharyngeal Current Disease Status: Metastatic Disease AJCC T Category: TX AJCC N Category: NX AJCC M Category: M1 AJCC 8 Stage Grouping: IVB Line of Therapy: Third Geophysical data processor Tumor Mutational Burden (TMB): TMB-L Intent of Therapy: Non-Curative / Palliative Intent, Discussed with Patient

## 2020-06-03 NOTE — Assessment & Plan Note (Addendum)
He has signs of enlarging neck mass consistent with recurrent disease I recommend CT imaging of the chest and neck for baseline imaging study I will plan to start him on chemotherapy end of next week but I will see him back before that to review imaging studies He had good response to 5-FU, carboplatin and Taxotere treatment 10 years ago Recommend we resume treatment with combination of carboplatin, 5-FU and pembrolizumab

## 2020-06-03 NOTE — Assessment & Plan Note (Signed)
He has remote history of severe chemotherapy-induced nausea and vomiting We will add additional IV fluid support for him in the future

## 2020-06-03 NOTE — Assessment & Plan Note (Signed)
He has moderate cancer associated pain When he calls for refill, I plan to increase the frequency of as needed pain medicine for him to take as needed

## 2020-06-03 NOTE — Assessment & Plan Note (Signed)
He denies cough, chest pain or shortness of breath Observe for now Plan to order imaging study

## 2020-06-03 NOTE — Assessment & Plan Note (Signed)
He is aware that treatment goal is palliative in nature

## 2020-06-03 NOTE — Progress Notes (Signed)
[place] OFFICE PROGRESS NOTE  Patient Care Team: Patient, No Pcp Per as PCP - General (General Practice) [name], MD as Consulting Physician (Hematology and Oncology) Eppie Gibson, MD as Attending Physician (Radiation Oncology) Jodi Marble, MD as Consulting Physician (Otolaryngology) Philomena Doheny, MD as Referring Physician (Plastic Surgery) Irene Shipper, MD as Consulting Physician (Gastroenterology) System, Provider Not In  ASSESSMENT & PLAN:  Primary cancer of head and neck [facility]) He has signs of enlarging neck mass consistent with recurrent disease I recommend CT imaging of the chest and neck for baseline imaging study I will plan to start him on chemotherapy end of next week but I will see him back before that to review imaging studies He had good response to 5-FU, carboplatin and Taxotere treatment 10 years ago Recommend we resume treatment with combination of carboplatin, 5-FU and pembrolizumab  Metastasis to lung He denies cough, chest pain or shortness of breath Observe for now Plan to order imaging study  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 CT CHEST W CONTRAST    Standing Status:   Future    Standing Expiration Date:   06/03/2021    Order Specific Question:   If indicated for the ordered procedure, I authorize the administration of contrast media per Radiology protocol    Answer:   Yes    Order Specific Question:   Preferred imaging location?    Answer:   [facility]    Order Specific Question:    Radiology Contrast Protocol - do NOT remove file path    Answer:   [email or web address]  . CT Soft Tissue Neck W Contrast    Standing Status:   Future    Standing Expiration Date:   06/03/2021    Order Specific Question:   If indicated for the ordered procedure, I authorize the administration of contrast media per Radiology protocol    Answer:   Yes    Order Specific Question:   Preferred imaging location?    Answer:   [facility]    Order Specific Question:   Radiology Contrast Protocol - do NOT remove file path    Answer:   [email or web address]    All questions were answered. The patient knows to call the clinic with any problems, questions or concerns. The total time spent in the appointment was 40 minutes encounter with patients including review of chart and various tests results, discussions about plan of care and coordination of care plan   [name], MD 06/03/2020 3:41 PM  INTERVAL HISTORY: Please see below for problem oriented charting. 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 Pan-endoscopy elsewhere showed cancer from nasopharyngeal space.   01/04/2007 - 02/20/2007 Chemotherapy   He received 2 cycles of cisplatin and 5FU followed by concurrent chemo with weekly cisplatin and radiation. He only received 2 doses of chemo due to severe mucositis, nausea and weight loss.   04/05/2007 - 08/04/2007 Chemotherapy   He received 4 more courses of cisplatin with 5FU and had complete response   07/05/2009 Procedure   Fine-needle aspirate of the right level II lymph nodes come from recurrent metastatic disease. Repeat endoscopy and CT scan show no evidence of disease elsewhere.   07/08/2009 - 12/02/2009 Chemotherapy   He was given 6 cycles of carboplatin, 5-FU and docetaxel   12/03/2009 Surgery   He has surgery to the residual lymph node on the right neck which showed no evidence of disease.   02/22/2012 Imaging   Repeat imaging study showed large recurrent mass. He was referred elsewhere for further treatment.   05/03/2012 Surgery   He underwent left upper lobectomy.   04/29/2013 Imaging   PEt scan showed lesion on right level II B and lower lung was abnormal   06/03/2013 - 02/02/2014 Chemotherapy   He had 6 cycles of chemotherapy when he was found to have recurrence of cancer and had received oxaliplatin and capecitabine   06/07/2014 Imaging   PET CT scan showed persistent disease in the right neck lymph nodes and left lung   06/29/2014 Procedure   Accession: [id number] repeat LUL biopsy confirmed metastatic cancer   07/18/2014 - 07/31/2014 Radiation Therapy   He received palliative radiation therapy to the lungs   10/10/2014 Imaging   CT scan of the chest, abdomen and pelvis show regression in the size of the lung nodule in the left upper lobe and stable pulmonary nodules   01/24/2015 Imaging   CT scan showed stable disease in neck and lung   06/19/2015 Imaging   CT scan of the neck and the chest show possible mild progression of the nodule in the right side of the  neck.   06/25/2015 Imaging   PET scan confirmed disease recurrence in the neck   07/07/2015 Imaging   He had MRI neck at [facility]   09/03/2015 - 08/26/2018 Chemotherapy   He received palliative chemo with Nivolumab   10/29/2015 Imaging   PET CT showed positive response to Rx   02/28/2016 Imaging   Ct abdomen showed abnormal thinkening in his stomach   03/03/2016 Imaging   CT: Right sternocleidomastoid muscle metastasis appears less distinct but otherwise not significantly changed in size or configuration since 06/19/2015.2. Left level 3 lymph node which was hypermetabolic by PET-CT in January 2017 appears slightly smaller   04/01/2016 Imaging   CT cervical spine showed no acute fracture or traumatic malalignment in the cervical spine   04/22/2016 Procedure   Port-a-cath placed.   06/16/2016 Imaging   Ct neck showed right sternocleidomastoid muscle metastasis is further decreased in conspicuity since May, and has mildly decreased in size since September 2016. Continued stability of sub-centimeter left cervical lymph nodes.  No new or progressive metastatic disease in the neck.   06/16/2016 Imaging   CT chest showed stable masslike radiation fibrosis in the left upper lobe. Stable subcentimeter pulmonary nodules in the bilateral lower lobes. No new or progressive metastatic disease in the chest. Nonobstructing left renal stone.   10/13/2016 Imaging   Ct neck showed unchanged right sternocleidomastoid muscle metastasis. Unchanged subcentimeter left cervical lymph nodes. No evidence of new or progressive metastatic disease in the neck.   10/13/2016 Imaging   CT chest showed tiny hypervascular foci in the liver, not definitely seen on prior imaging of 06/16/2016 and 02/28/2016. Abdomen MRI without and with contrast recommended to further evaluate as metastatic disease is a concern. 2. Stable appearance of post treatment changes left upper lung and scattered tiny bilateral pulmonary nodules.    02/11/2017 Imaging   Ct neck: Lymph node mass right posterior neck appears improved from the prior study. Small posterior lymph nodes on the left unchanged. Occluded right jugular vein unchanged.   02/11/2017 Imaging   1. Similar appearance of postsurgical and radiation changes in the left upper lobe. 2. Similar bilateral pulmonary nodules. 3. No thoracic adenopathy. 4. Subtle foci of post-contrast enhancement within the liver are suboptimally characterized on this nondedicated study. Likely similar. These could either be re-evaluated at followup or more entirely characterized with abdominal MRI. 5. Left nephrolithiasis.   05/19/2017 Imaging   Matted lymph node mass right posterior neck appears larger in the recent CT. Accurate measurements difficult due to infiltrating tumor margins and infiltration of the muscle. Right jugular vein again appears occluded or resected. Small left posterior lymph nodes stable. Left upper lobe airspace density stable and similar to the prior CT   06/03/2017 PET scan   1. Hypermetabolic ill-defined right level IIb lymph node, about 1.3 cm in diameter with maximum SUV 9.5 (formerly 8.1). Appearance suspicious for residual/recurrent malignancy. No worrisome left-sided lesion. 2. Left suprahilar indistinct opacity demonstrates no worrisome hypermetabolic activity. The 5 mm left lower lobe pulmonary nodule is stable and not currently hypermetabolic although below sensitive PET-CT size thresholds. 3. Other imaging findings of potential clinical significance: Bilateral nonobstructive nephrolithiasis. Chronic bilateral maxillary sinusitis.   05/11/2018 PET scan   1. Continued chronic accentuated metabolic activity in the vicinity of right level IIB and the adjacent right sternocleidomastoid muscle, with ill definition of surrounding tissue planes. Maximum SUV is currently 8.1, formerly 9.5. Accentuated metabolic activity is been present in this vicinity back through  06/25/2015, and there was also some low-level activity in this vicinity on 06/07/2014. Some of this may be from scarring and local muscular activity although clearly a component of residual tumor is difficult to exclude given the focally high activity. 2. Other imaging findings of potential clinical significance: Chronic bilateral maxillary sinusitis. Chronic scarring in the left upper lobe. Chronically stable 5 mm left lower lobe nodule is considered benign. Nonobstructive left nephrolithiasis.   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 COMMENT:No significant keratinization is identified. Other basaloid carcinomas are in the differential diagnosis. No cell block material is available for further testing.   09/12/2018 Procedure   He underwent fine Needle Aspiration   10/04/2018 PET scan   1. Significant progression of local recurrence laterally in the mid right neck with an enlarging, increasingly hypermetabolic soft tissue mass. This involves the right sternocleidomastoid muscle. 2. Small lymph nodes in the right axilla are  increasingly hypermetabolic. These are nonspecific and potentially reactive, although could reflect a small metastases. Small hypermetabolic nodule in the left suprasternal notch is unchanged. 3. No other evidence of metastatic disease.    10/07/2018 - 12/23/2018 Chemotherapy   The patient had cisplatin plus gemzar   12/07/2018 Imaging   1. Decreased size of lateral right neck mass. 2. Unchanged soft tissue nodule in the suprasternal notch. 3. No evidence of new metastatic disease in the neck.    05/30/2019 Imaging   CT neck No clear change or progression compared to the study of March. Overall measurements of the right lateral neck mass are similar, approximately 3 x 1.8 cm. See above discussion. One could argue that there  is slight increase in lateral bulging, possibly with an increase in contrast enhancement, towards the inferior margin. This is of questionable validity but could possibly represent some progression or inflammatory change. Other findings in the region are stable.   07/20/2019 - 09/28/2019 Chemotherapy   The patient had palonosetron (ALOXI) injection 0.25 mg, 0.25 mg, Intravenous,  Once, 3 of 4 cycles Administration: 0.25 mg (07/20/2019), 0.25 mg (08/10/2019), 0.25 mg (09/08/2019) CISplatin (PLATINOL) 84 mg in sodium chloride 0.9 % 250 mL chemo infusion, 40 mg/m2 = 84 mg (80 % of original dose 50 mg/m2), Intravenous,  Once, 3 of 4 cycles Dose modification: 40 mg/m2 (80 % of original dose 50 mg/m2, Cycle 1, Reason: Dose Not Tolerated) Administration: 84 mg (07/20/2019), 84 mg (08/10/2019), 83 mg (09/08/2019) gemcitabine (GEMZAR) 1,600 mg in sodium chloride 0.9 % 250 mL chemo infusion, 1,672 mg (80 % of original dose 1,000 mg/m2), Intravenous,  Once, 3 of 4 cycles Dose modification: 800 mg/m2 (80 % of original dose 1,000 mg/m2, Cycle 1, Reason: Provider Judgment) Administration: 1,600 mg (07/20/2019), 1,600 mg (07/27/2019), 1,600 mg (08/10/2019), 1,600 mg (08/17/2019), 1,600 mg (09/08/2019), 1,672 mg (09/15/2019) ondansetron (ZOFRAN) 8 mg, dexamethasone (DECADRON) 10 mg in sodium chloride 0.9 % 50 mL IVPB, , Intravenous,  Once, 3 of 4 cycles Administration:  (09/15/2019) fosaprepitant (EMEND) 150 mg, dexamethasone (DECADRON) 12 mg in sodium chloride 0.9 % 145 mL IVPB, , Intravenous,  Once, 3 of 4 cycles Administration:  (07/20/2019),  (08/10/2019),  (09/08/2019)  for chemotherapy treatment.    10/02/2019 Imaging   CT neck As compared to 05/30/2019, no significant interval change in size of an ill-defined mass within the right lateral neck, again measuring 3.3 x 1.8 cm in transaxial dimensions.   Unchanged mildly enlarged left level I lymph node measuring 1.1 cm in short axis.   Unchanged node or nodule at  the thoracic inlet, measuring 1.3 x 0.8 cm.   Please refer to concurrently performed chest CT for a description of findings below the level of the thoracic inlet.     10/02/2019 Imaging   CT chest 1. No new or progressive findings in the chest to suggest metastatic disease. 2. Bilateral subcentimeter solid pulmonary nodules are stable since 2018. 3. Hyperdense 1.1 cm anterior liver focus, not clearly visualized on prior studies. Suggest MRI abdomen without and with IV contrast for further characterization.   10/20/2019 - 12/29/2019 Chemotherapy   The patient had ondansetron (ZOFRAN) injection 8 mg, 8 mg (100 % of original dose 8 mg), Intravenous,  Once, 2 of 5 cycles Dose modification: 8 mg (original dose 8 mg, Cycle 2) Administration: 8 mg (11/17/2019), 8 mg (12/15/2019), 8 mg (12/29/2019) gemcitabine (GEMZAR) 2,000 mg in sodium chloride 0.9 % 250 mL chemo infusion, 2,090 mg, Intravenous,  Once, 3 of  6 cycles Administration: 2,000 mg (10/20/2019), 2,000 mg (11/03/2019), 2,000 mg (11/17/2019), 2,000 mg (12/15/2019), 2,000 mg (12/29/2019)  for chemotherapy treatment.    06/14/2020 -  Chemotherapy   The patient had palonosetron (ALOXI) injection 0.25 mg, 0.25 mg, Intravenous,  Once, 0 of 3 cycles fosaprepitant (EMEND) 150 mg in sodium chloride 0.9 % 145 mL IVPB, 150 mg, Intravenous,  Once, 0 of 3 cycles pembrolizumab (KEYTRUDA) 200 mg in sodium chloride 0.9 % 50 mL chemo infusion, 200 mg (original dose ), Intravenous, Once, 0 of 3 cycles Dose modification: 200 mg (Cycle 1, Reason: Provider Judgment) fluorouracil (ADRUCIL) 8,450 mg in sodium chloride 0.9 % 81 mL chemo infusion, 1,000 mg/m2/day, Intravenous, 4D (96 hours ), 0 of 3 cycles CARBOplatin (PARAPLATIN) in sodium chloride 0.9 % 100 mL chemo infusion, , Intravenous,  Once, 0 of 3 cycles  for chemotherapy treatment.      REVIEW OF SYSTEMS:   Constitutional: Denies fevers, chills or abnormal weight loss Eyes: Denies blurriness of vision Ears,  nose, mouth, throat, and face: Denies mucositis or sore throat Respiratory: Denies cough, dyspnea or wheezes Cardiovascular: Denies palpitation, chest discomfort or lower extremity swelling Gastrointestinal:  Denies nausea, heartburn or change in bowel habits Skin: Denies abnormal skin rashes Lymphatics: Denies new lymphadenopathy or easy bruising Neurological:Denies numbness, tingling or new weaknesses Behavioral/Psych: Mood is stable, no new changes  All other systems were reviewed with the patient and are negative.  I have reviewed the past medical history, past surgical history, social history and family history with the patient and they are unchanged from previous note.  ALLERGIES:  is allergic to phenergan [promethazine hcl], heparin, and clindamycin.  MEDICATIONS:  Current Outpatient Medications  Medication Sig Dispense Refill  . atorvastatin (LIPITOR) 40 MG tablet TK 1 T PO  D    . cetirizine (ZYRTEC) 10 MG tablet Take 1 tablet (10 mg total) by mouth daily. 30 tablet 1  . ibuprofen (ADVIL) 600 MG tablet TK 1 T PO Q 6 H PRN    . levothyroxine (SYNTHROID) 137 MCG tablet Take 1 tablet (137 mcg total) by mouth daily before breakfast. 90 tablet 9  . lidocaine (XYLOCAINE) 2 % solution Use as directed 5 mLs in the mouth or throat every 3 (three) hours as needed for mouth pain. Swish, gargle and spit 100 mL 2  . LORazepam (ATIVAN) 1 MG tablet Take 1 tablet (1 mg total) by mouth every 8 (eight) hours as needed for anxiety. 30 tablet 3  . omeprazole (PRILOSEC) 20 MG capsule Take 1 capsule (20 mg total) by mouth daily. 30 capsule 11  . ondansetron (ZOFRAN) 8 MG tablet Take 1 tablet (8 mg total) by mouth every 8 (eight) hours as needed for nausea or vomiting. 90 tablet 1  . oxyCODONE (ROXICODONE) 15 MG immediate release tablet Take 1 tablet (15 mg total) by mouth every 6 (six) hours as needed. 60 tablet 0  . PARoxetine (PAXIL) 20 MG tablet Take 1 tablet (20 mg total) by mouth daily. 30 tablet 5   . polyethylene glycol (MIRALAX) packet Take 17 g by mouth daily. (Patient taking differently: Take 17 g by mouth daily as needed for mild constipation or moderate constipation. ) 14 each 3  . prochlorperazine (COMPAZINE) 10 MG tablet Take 1 tablet (10 mg total) by mouth every 12 (twelve) hours as needed (Nausea or vomiting). 30 tablet 1  . senna-docusate (SENOKOT-S) 8.6-50 MG tablet Take 2 tablets by mouth 3 (three) times daily. 90 tablet 0  .  Vitamin D, Ergocalciferol, (DRISDOL) 1.25 MG (50000 UT) CAPS capsule TK 1 C PO WEEKLY     No current facility-administered medications for this visit.   Facility-Administered Medications Ordered in Other Visits  Medication Dose Route Frequency Provider Last Rate Last Admin  . anticoagulant sodium citrate solution 5 mL  5 mL Intracatheter Once Alvy Bimler, Stasia Somero, MD      . anticoagulant sodium citrate solution 5 mL  5 mL Intracatheter Once [name], MD        PHYSICAL EXAMINATION: ECOG PERFORMANCE STATUS: 2 - Symptomatic, <50% confined to bed  Vitals:   06/03/20 1209  BP: (!) 127/94  Pulse: 85  Resp: 18  Temp: (!) 96.8 F (36 C)  SpO2: 99%   Filed Weights   06/03/20 1209  Weight: 200 lb 12.8 oz (91.1 kg)    GENERAL:alert, no distress and comfortable NECK: He has large neck mass on the right side consistent with disease recurrence NEURO: alert & oriented x 3 with fluent speech, no focal motor/sensory deficits  LABORATORY DATA:  I have reviewed the data as listed    Component Value Date/Time   NA 142 12/29/2019 1352   NA 139 09/22/2017 0829   K 3.7 12/29/2019 1352   K 3.5 09/22/2017 0829   CL 104 12/29/2019 1352   CO2 28 12/29/2019 1352   CO2 26 09/22/2017 0829   GLUCOSE 142 (H) 12/29/2019 1352   GLUCOSE 133 09/22/2017 0829   BUN 15 12/29/2019 1352   BUN 14.1 09/22/2017 0829   CREATININE 1.07 12/29/2019 1352   CREATININE 0.93 02/06/2019 1215   CREATININE 0.9 09/22/2017 0829   CALCIUM 9.3 12/29/2019 1352   CALCIUM 9.1 09/22/2017  0829   PROT 7.1 12/29/2019 1352   PROT 6.8 09/22/2017 0829   ALBUMIN 4.1 12/29/2019 1352   ALBUMIN 4.1 09/22/2017 0829   AST 29 12/29/2019 1352   AST 31 02/06/2019 1215   AST 22 09/22/2017 0829   ALT 56 (H) 12/29/2019 1352   ALT 48 (H) 02/06/2019 1215   ALT 30 09/22/2017 0829   ALKPHOS 72 12/29/2019 1352   ALKPHOS 55 09/22/2017 0829   BILITOT 0.3 12/29/2019 1352   BILITOT 0.2 (L) 02/06/2019 1215   BILITOT 0.35 09/22/2017 0829   GFRNONAA >60 12/29/2019 1352   GFRNONAA >60 02/06/2019 1215   GFRAA >60 12/29/2019 1352   GFRAA >60 02/06/2019 1215    No results found for: SPEP, UPEP  Lab Results  Component Value Date   WBC 5.6 12/29/2019   NEUTROABS 3.7 12/29/2019   HGB 13.4 12/29/2019   HCT 40.5 12/29/2019   MCV 91.2 12/29/2019   PLT 291 12/29/2019      Chemistry      Component Value Date/Time   NA 142 12/29/2019 1352   NA 139 09/22/2017 0829   K 3.7 12/29/2019 1352   K 3.5 09/22/2017 0829   CL 104 12/29/2019 1352   CO2 28 12/29/2019 1352   CO2 26 09/22/2017 0829   BUN 15 12/29/2019 1352   BUN 14.1 09/22/2017 0829   CREATININE 1.07 12/29/2019 1352   CREATININE 0.93 02/06/2019 1215   CREATININE 0.9 09/22/2017 0829      Component Value Date/Time   CALCIUM 9.3 12/29/2019 1352   CALCIUM 9.1 09/22/2017 0829   ALKPHOS 72 12/29/2019 1352   ALKPHOS 55 09/22/2017 0829   AST 29 12/29/2019 1352   AST 31 02/06/2019 1215   AST 22 09/22/2017 0829   ALT 56 (H) 12/29/2019 1352   ALT  48 (H) 02/06/2019 1215   ALT 30 09/22/2017 0829   BILITOT 0.3 12/29/2019 1352   BILITOT 0.2 (L) 02/06/2019 1215   BILITOT 0.35 09/22/2017 5945

## 2020-06-06 ENCOUNTER — Telehealth: Payer: Self-pay

## 2020-06-06 NOTE — Telephone Encounter (Signed)
Called pt and LVM informed him he would need to be here 9/8 at 1300 for check in, labs & flush prior to CT. Advised pt to call back with questions [phone].

## 2020-06-07 NOTE — Progress Notes (Signed)
Pharmacist Chemotherapy Monitoring - Initial Assessment    Anticipated start date: 06/14/20   Regimen:   . Are orders appropriate based on the patient's diagnosis, regimen, and cycle? Yes . Does the plan date match the patient's scheduled date? Yes . Is the sequencing of drugs appropriate? Yes . Are the premedications appropriate for the patient's regimen? Yes . Prior Authorization for treatment is: Approved o If applicable, is the correct biosimilar selected based on the patient's insurance? not applicable  Organ Function and Labs: Marland Kitchen Are dose adjustments needed based on the patient's renal function, hepatic function, or hematologic function? No . Are appropriate labs ordered prior to the start of patient's treatment? Yes . Other organ system assessment, if indicated: N/A . The following baseline labs, if indicated, have been ordered: pembrolizumab: baseline TSH +/- T4  Dose Assessment: . Are the drug doses appropriate? Yes . Are the following correct: o Drug concentrations Yes o IV fluid compatible with drug Yes o Administration routes Yes o Timing of therapy Yes . If applicable, does the patient have documented access for treatment and/or plans for port-a-cath placement? yes . If applicable, have lifetime cumulative doses been properly documented and assessed? yes Lifetime Dose Tracking  No doses have been documented on this patient for the following tracked chemicals: Doxorubicin, Epirubicin, Idarubicin, Daunorubicin, Mitoxantrone, Bleomycin, Oxaliplatin, Carboplatin, Liposomal Doxorubicin  o   Toxicity Monitoring/Prevention: . The patient has the following take home antiemetics prescribed: Ondansetron, Prochlorperazine and Lorazepam . The patient has the following take home medications prescribed: N/A . Medication allergies and previous infusion related reactions, if applicable, have been reviewed and addressed. Yes . The patient's current medication list has been assessed for  drug-drug interactions with their chemotherapy regimen. no significant drug-drug interactions were identified on review.  Order Review: . Are the treatment plan orders signed? No . Is the patient scheduled to see a provider prior to their treatment? Yes  I verify that I have reviewed each item in the above checklist and answered each question accordingly.   Kennith Center, Pharm.D., CPP 06/07/2020@12 :33 PM

## 2020-06-11 ENCOUNTER — Ambulatory Visit (HOSPITAL_COMMUNITY): Payer: Medicaid Other

## 2020-06-11 ENCOUNTER — Telehealth: Payer: Self-pay | Admitting: *Deleted

## 2020-06-11 ENCOUNTER — Other Ambulatory Visit: Payer: Self-pay | Admitting: Hematology and Oncology

## 2020-06-11 MED ORDER — OXYCODONE HCL 15 MG PO TABS: 15.0000 mg | ORAL_TABLET | ORAL | 0 refills | Status: DC | PRN

## 2020-06-11 NOTE — Telephone Encounter (Signed)
Pt made aware that refill of oxycodone 15 mg has been sent to preferred pharmacy. Pt verbalized understanding

## 2020-06-12 ENCOUNTER — Other Ambulatory Visit: Payer: Self-pay

## 2020-06-12 ENCOUNTER — Ambulatory Visit (HOSPITAL_COMMUNITY)
Admission: RE | Admit: 2020-06-12 | Discharge: 2020-06-12 | Disposition: A | Discharge status: Home / Self Care | Payer: Medicaid Other | Source: Ambulatory Visit | Attending: Hematology and Oncology | Admitting: Hematology and Oncology

## 2020-06-12 ENCOUNTER — Inpatient Hospital Stay: Payer: Medicaid Other | Attending: Hematology and Oncology

## 2020-06-12 ENCOUNTER — Inpatient Hospital Stay: Payer: Medicaid Other

## 2020-06-12 DIAGNOSIS — Z5111 Encounter for antineoplastic chemotherapy: Secondary | ICD-10-CM | POA: Insufficient documentation

## 2020-06-12 DIAGNOSIS — R112 Nausea with vomiting, unspecified: Secondary | ICD-10-CM | POA: Insufficient documentation

## 2020-06-12 DIAGNOSIS — C119 Malignant neoplasm of nasopharynx, unspecified: Secondary | ICD-10-CM | POA: Insufficient documentation

## 2020-06-12 DIAGNOSIS — E039 Hypothyroidism, unspecified: Secondary | ICD-10-CM | POA: Diagnosis not present

## 2020-06-12 DIAGNOSIS — C7800 Secondary malignant neoplasm of unspecified lung: Secondary | ICD-10-CM | POA: Diagnosis present

## 2020-06-12 DIAGNOSIS — Z79899 Other long term (current) drug therapy: Secondary | ICD-10-CM | POA: Insufficient documentation

## 2020-06-12 DIAGNOSIS — Z23 Encounter for immunization: Secondary | ICD-10-CM | POA: Diagnosis not present

## 2020-06-12 DIAGNOSIS — Z95828 Presence of other vascular implants and grafts: Secondary | ICD-10-CM | POA: Diagnosis present

## 2020-06-12 DIAGNOSIS — C760 Malignant neoplasm of head, face and neck: Secondary | ICD-10-CM

## 2020-06-12 LAB — CBC WITH DIFFERENTIAL/PLATELET
Abs Immature Granulocytes: 0.01 10*3/uL (ref 0.00–0.07)
Basophils Absolute: 0 10*3/uL (ref 0.0–0.1)
Basophils Relative: 1 %
Eosinophils Absolute: 0.2 10*3/uL (ref 0.0–0.5)
Eosinophils Relative: 2 %
HCT: 41 % (ref 39.0–52.0)
Hemoglobin: 13.7 g/dL (ref 13.0–17.0)
Immature Granulocytes: 0 %
Lymphocytes Relative: 22 %
Lymphs Abs: 1.4 10*3/uL (ref 0.7–4.0)
MCH: 29.6 pg (ref 26.0–34.0)
MCHC: 33.4 g/dL (ref 30.0–36.0)
MCV: 88.6 fL (ref 80.0–100.0)
Monocytes Absolute: 0.7 10*3/uL (ref 0.1–1.0)
Monocytes Relative: 10 %
Neutro Abs: 4.2 10*3/uL (ref 1.7–7.7)
Neutrophils Relative %: 65 %
Platelets: 210 10*3/uL (ref 150–400)
RBC: 4.63 MIL/uL (ref 4.22–5.81)
RDW: 14.6 % (ref 11.5–15.5)
WBC: 6.5 10*3/uL (ref 4.0–10.5)
nRBC: 0 % (ref 0.0–0.2)

## 2020-06-12 LAB — COMPREHENSIVE METABOLIC PANEL
ALT: 24 U/L (ref 0–44)
AST: 24 U/L (ref 15–41)
Albumin: 4.1 g/dL (ref 3.5–5.0)
Alkaline Phosphatase: 62 U/L (ref 38–126)
Anion gap: 8 (ref 5–15)
BUN: 12 mg/dL (ref 6–20)
CO2: 28 mmol/L (ref 22–32)
Calcium: 9.4 mg/dL (ref 8.9–10.3)
Chloride: 104 mmol/L (ref 98–111)
Creatinine, Ser: 0.92 mg/dL (ref 0.61–1.24)
GFR calc Af Amer: >60 mL/min (ref >60)
GFR calc non Af Amer: >60 mL/min (ref >60)
Glucose, Bld: 107 mg/dL — ABNORMAL HIGH (ref 70–99)
Potassium: 3.7 mmol/L (ref 3.5–5.1)
Sodium: 140 mmol/L (ref 135–145)
Total Bilirubin: 0.3 mg/dL (ref 0.3–1.2)
Total Protein: 7 g/dL (ref 6.5–8.1)

## 2020-06-12 LAB — TSH: TSH: 10.54 u[IU]/mL — ABNORMAL HIGH (ref 0.320–4.118)

## 2020-06-12 MED ORDER — ANTICOAGULANT SODIUM CITRATE 4% (200MG/5ML) IV SOLN
5.0000 mL | Freq: Once | Status: DC
  Filled 2020-06-12 (×2): qty 5

## 2020-06-12 MED ORDER — IOHEXOL 300 MG/ML  SOLN
100.0000 mL | Freq: Once | INTRAMUSCULAR | Status: AC | PRN
  Administered 2020-06-12: 100 mL via INTRAVENOUS

## 2020-06-13 ENCOUNTER — Telehealth: Payer: Self-pay | Admitting: Hematology and Oncology

## 2020-06-13 ENCOUNTER — Other Ambulatory Visit: Payer: Medicaid Other

## 2020-06-13 ENCOUNTER — Inpatient Hospital Stay (HOSPITAL_BASED_OUTPATIENT_CLINIC_OR_DEPARTMENT_OTHER): Payer: Medicaid Other | Admitting: Hematology and Oncology

## 2020-06-13 ENCOUNTER — Other Ambulatory Visit: Payer: Self-pay

## 2020-06-13 ENCOUNTER — Encounter: Payer: Self-pay | Admitting: Hematology and Oncology

## 2020-06-13 ENCOUNTER — Other Ambulatory Visit: Payer: Self-pay | Admitting: Hematology and Oncology

## 2020-06-13 DIAGNOSIS — E039 Hypothyroidism, unspecified: Secondary | ICD-10-CM | POA: Diagnosis not present

## 2020-06-13 DIAGNOSIS — C760 Malignant neoplasm of head, face and neck: Secondary | ICD-10-CM | POA: Diagnosis not present

## 2020-06-13 DIAGNOSIS — Z299 Encounter for prophylactic measures, unspecified: Secondary | ICD-10-CM | POA: Diagnosis not present

## 2020-06-13 DIAGNOSIS — Z5111 Encounter for antineoplastic chemotherapy: Secondary | ICD-10-CM | POA: Diagnosis not present

## 2020-06-13 DIAGNOSIS — Z7189 Other specified counseling: Secondary | ICD-10-CM | POA: Diagnosis not present

## 2020-06-13 DIAGNOSIS — R112 Nausea with vomiting, unspecified: Secondary | ICD-10-CM

## 2020-06-13 MED ORDER — ONDANSETRON HCL 8 MG PO TABS: 8.0000 mg | ORAL_TABLET | Freq: Three times a day (TID) | ORAL | 1 refills | Status: DC | PRN

## 2020-06-13 MED ORDER — PROCHLORPERAZINE MALEATE 10 MG PO TABS: 10.0000 mg | ORAL_TABLET | Freq: Two times a day (BID) | ORAL | 1 refills | Status: DC | PRN

## 2020-06-13 NOTE — Telephone Encounter (Signed)
Scheduled appts per 9/9 sch msg. Pt declined print out of AVS and stated he would refer to mychart.

## 2020-06-13 NOTE — Assessment & Plan Note (Signed)
We discussed the importance of Covid vaccination He would like to proceed with first dose tomorrow

## 2020-06-13 NOTE — Assessment & Plan Note (Signed)
He has history of nausea and vomiting We will prescribe IV fluids and IV antiemetics as needed

## 2020-06-13 NOTE — Progress Notes (Signed)
[place] OFFICE PROGRESS NOTE  Patient Care Team: Patient, No Pcp Per as PCP - General (General Practice) [name], MD as Consulting Physician (Hematology and Oncology) Eppie Gibson, MD as Attending Physician (Radiation Oncology) Jodi Marble, MD as Consulting Physician (Otolaryngology) Philomena Doheny, MD as Referring Physician (Plastic Surgery) Irene Shipper, MD as Consulting Physician (Gastroenterology) System, Provider Not In  ASSESSMENT & PLAN:  Primary cancer of head and neck [facility]) CT imaging showed no evidence of metastatic disease elsewhere He is currently symptomatic and we will proceed with chemotherapy as scheduled tomorrow We discussed the risk, benefits, side effects of combination chemotherapy of carboplatin, 5-FU and pembrolizumab and he is in agreement to proceed He will receive G-CSF support I plan to reduce the dose of 5-FU upfront due to history of severe nausea vomiting He will also come here for 5 days after treatment for IV fluid support  Acquired hypothyroidism His TSH was elevated recently He admits he has missed several doses For now, I do not plan to adjust his Synthroid dose We will recheck in his next visit  Preventive measure We discussed the importance of Covid vaccination He would like to proceed with first dose tomorrow  Nausea and vomiting He has history of nausea and vomiting We will prescribe IV fluids and IV antiemetics as needed   No orders of the defined types were placed in this encounter.   All questions were answered. The patient knows to call the clinic with any problems, questions or concerns. The total time spent in the appointment was 20 minutes encounter with patients including review of chart and various tests results, discussions about plan of care and coordination of care plan   [name], MD 06/13/2020 12:45 PM  INTERVAL HISTORY: Please see below for problem oriented charting. 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 Pan-endoscopy elsewhere showed cancer from nasopharyngeal space.   01/04/2007 - 02/20/2007 Chemotherapy   He received 2 cycles of cisplatin and 5FU followed by concurrent chemo with weekly cisplatin and radiation. He only received 2 doses of chemo due to severe mucositis, nausea and weight loss.   04/05/2007 - 08/04/2007 Chemotherapy   He received 4 more courses of cisplatin with 5FU and had complete response   07/05/2009 Procedure   Fine-needle aspirate of the right level II lymph nodes come from recurrent metastatic disease. Repeat endoscopy and CT scan show no evidence of disease elsewhere.   07/08/2009 - 12/02/2009 Chemotherapy   He was given 6 cycles of carboplatin, 5-FU and docetaxel   12/03/2009 Surgery   He has surgery to the residual lymph node on the right neck which showed no evidence of disease.   02/22/2012 Imaging   Repeat imaging study showed large recurrent mass. He was referred elsewhere for further treatment.   05/03/2012 Surgery  He underwent left upper lobectomy.   04/29/2013 Imaging   PEt scan showed lesion on right level II B and lower lung was abnormal   06/03/2013 - 02/02/2014 Chemotherapy   He had 6 cycles of chemotherapy when he was found to have recurrence of cancer and had received oxaliplatin and capecitabine    06/07/2014 Imaging   PET CT scan showed persistent disease in the right neck lymph nodes and left lung   06/29/2014 Procedure   Accession: [id number] repeat LUL biopsy confirmed metastatic cancer   07/18/2014 - 07/31/2014 Radiation Therapy   He received palliative radiation therapy to the lungs   10/10/2014 Imaging   CT scan of the chest, abdomen and pelvis show regression in the size of the lung nodule in the left upper lobe and stable pulmonary nodules   01/24/2015 Imaging   CT scan showed stable disease in neck and lung   06/19/2015 Imaging   CT scan of the neck and the chest show possible mild progression of the nodule in the right side of the neck.   06/25/2015 Imaging   PET scan confirmed disease recurrence in the neck   07/07/2015 Imaging   He had MRI neck at [facility]   09/03/2015 - 08/26/2018 Chemotherapy   He received palliative chemo with Nivolumab   10/29/2015 Imaging   PET CT showed positive response to Rx   02/28/2016 Imaging   Ct abdomen showed abnormal thinkening in his stomach   03/03/2016 Imaging   CT: Right sternocleidomastoid muscle metastasis appears less distinct but otherwise not significantly changed in size or configuration since 06/19/2015.2. Left level 3 lymph node which was hypermetabolic by PET-CT in January 2017 appears slightly smaller   04/01/2016 Imaging   CT cervical spine showed no acute fracture or traumatic malalignment in the cervical spine   04/22/2016 Procedure   Port-a-cath placed.   06/16/2016 Imaging   Ct neck showed right sternocleidomastoid muscle metastasis is further decreased in conspicuity since May, and has mildly decreased in size since September 2016. Continued stability of sub-centimeter left cervical lymph nodes. No new or progressive metastatic disease in the neck.   06/16/2016 Imaging   CT chest showed stable masslike radiation fibrosis in the left upper lobe. Stable subcentimeter pulmonary nodules in the bilateral lower lobes. No new  or progressive metastatic disease in the chest. Nonobstructing left renal stone.   10/13/2016 Imaging   Ct neck showed unchanged right sternocleidomastoid muscle metastasis. Unchanged subcentimeter left cervical lymph nodes. No evidence of new or progressive metastatic disease in the neck.   10/13/2016 Imaging   CT chest showed tiny hypervascular foci in the liver, not definitely seen on prior imaging of 06/16/2016 and 02/28/2016. Abdomen MRI without and with contrast recommended to further evaluate as metastatic disease is a concern. 2. Stable appearance of post treatment changes left upper lung and scattered tiny bilateral pulmonary nodules.   02/11/2017 Imaging   Ct neck: Lymph node mass right posterior neck appears improved from the prior study. Small posterior lymph nodes on the left unchanged. Occluded right jugular vein unchanged.   02/11/2017 Imaging   1. Similar appearance of postsurgical and radiation changes in the left upper lobe. 2. Similar bilateral pulmonary nodules. 3. No thoracic adenopathy. 4. Subtle foci of post-contrast enhancement within the liver are suboptimally characterized on this nondedicated study. Likely similar. These could either be re-evaluated at followup or more entirely characterized with abdominal MRI. 5. Left nephrolithiasis.   05/19/2017 Imaging   Matted lymph node mass right  posterior neck appears larger in the recent CT. Accurate measurements difficult due to infiltrating tumor margins and infiltration of the muscle. Right jugular vein again appears occluded or resected. Small left posterior lymph nodes stable. Left upper lobe airspace density stable and similar to the prior CT   06/03/2017 PET scan   1. Hypermetabolic ill-defined right level IIb lymph node, about 1.3 cm in diameter with maximum SUV 9.5 (formerly 8.1). Appearance suspicious for residual/recurrent malignancy. No worrisome left-sided lesion. 2. Left suprahilar indistinct opacity demonstrates no  worrisome hypermetabolic activity. The 5 mm left lower lobe pulmonary nodule is stable and not currently hypermetabolic although below sensitive PET-CT size thresholds. 3. Other imaging findings of potential clinical significance: Bilateral nonobstructive nephrolithiasis. Chronic bilateral maxillary sinusitis.   05/11/2018 PET scan   1. Continued chronic accentuated metabolic activity in the vicinity of right level IIB and the adjacent right sternocleidomastoid muscle, with ill definition of surrounding tissue planes. Maximum SUV is currently 8.1, formerly 9.5. Accentuated metabolic activity is been present in this vicinity back through 06/25/2015, and there was also some low-level activity in this vicinity on 06/07/2014. Some of this may be from scarring and local muscular activity although clearly a component of residual tumor is difficult to exclude given the focally high activity. 2. Other imaging findings of potential clinical significance: Chronic bilateral maxillary sinusitis. Chronic scarring in the left upper lobe. Chronically stable 5 mm left lower lobe nodule is considered benign. Nonobstructive left nephrolithiasis.   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 COMMENT:No significant keratinization is identified. Other basaloid carcinomas are in the differential diagnosis. No cell block material is available for further testing.   09/12/2018 Procedure   He underwent fine Needle Aspiration   10/04/2018 PET scan   1. Significant progression of local recurrence laterally in the mid right neck with an enlarging, increasingly hypermetabolic soft tissue mass. This involves the right sternocleidomastoid muscle. 2. Small lymph nodes in the right axilla are increasingly hypermetabolic. These are nonspecific and potentially reactive, although could  reflect a small metastases. Small hypermetabolic nodule in the left suprasternal notch is unchanged. 3. No other evidence of metastatic disease.    10/07/2018 - 12/23/2018 Chemotherapy   The patient had cisplatin plus gemzar   12/07/2018 Imaging   1. Decreased size of lateral right neck mass. 2. Unchanged soft tissue nodule in the suprasternal notch. 3. No evidence of new metastatic disease in the neck.    05/30/2019 Imaging   CT neck No clear change or progression compared to the study of March. Overall measurements of the right lateral neck mass are similar, approximately 3 x 1.8 cm. See above discussion. One could argue that there is slight increase in lateral bulging, possibly with an increase in contrast enhancement, towards the inferior margin. This is of questionable validity but could possibly represent some progression or inflammatory change. Other findings in the region are stable.   07/20/2019 - 09/28/2019 Chemotherapy   The patient had palonosetron (ALOXI) injection 0.25 mg, 0.25 mg, Intravenous,  Once, 3 of 4 cycles Administration: 0.25 mg (07/20/2019), 0.25 mg (08/10/2019), 0.25 mg (09/08/2019) CISplatin (PLATINOL) 84 mg in sodium chloride 0.9 % 250 mL chemo infusion, 40 mg/m2 = 84 mg (80 % of original dose 50 mg/m2), Intravenous,  Once, 3 of 4 cycles Dose modification: 40 mg/m2 (80 % of original dose 50 mg/m2, Cycle 1, Reason: Dose Not Tolerated)  Administration: 84 mg (07/20/2019), 84 mg (08/10/2019), 83 mg (09/08/2019) gemcitabine (GEMZAR) 1,600 mg in sodium chloride 0.9 % 250 mL chemo infusion, 1,672 mg (80 % of original dose 1,000 mg/m2), Intravenous,  Once, 3 of 4 cycles Dose modification: 800 mg/m2 (80 % of original dose 1,000 mg/m2, Cycle 1, Reason: Provider Judgment) Administration: 1,600 mg (07/20/2019), 1,600 mg (07/27/2019), 1,600 mg (08/10/2019), 1,600 mg (08/17/2019), 1,600 mg (09/08/2019), 1,672 mg (09/15/2019) ondansetron (ZOFRAN) 8 mg, dexamethasone (DECADRON) 10 mg in  sodium chloride 0.9 % 50 mL IVPB, , Intravenous,  Once, 3 of 4 cycles Administration:  (09/15/2019) fosaprepitant (EMEND) 150 mg, dexamethasone (DECADRON) 12 mg in sodium chloride 0.9 % 145 mL IVPB, , Intravenous,  Once, 3 of 4 cycles Administration:  (07/20/2019),  (08/10/2019),  (09/08/2019)  for chemotherapy treatment.    10/02/2019 Imaging   CT neck As compared to 05/30/2019, no significant interval change in size of an ill-defined mass within the right lateral neck, again measuring 3.3 x 1.8 cm in transaxial dimensions.   Unchanged mildly enlarged left level I lymph node measuring 1.1 cm in short axis.   Unchanged node or nodule at the thoracic inlet, measuring 1.3 x 0.8 cm.   Please refer to concurrently performed chest CT for a description of findings below the level of the thoracic inlet.     10/02/2019 Imaging   CT chest 1. No new or progressive findings in the chest to suggest metastatic disease. 2. Bilateral subcentimeter solid pulmonary nodules are stable since 2018. 3. Hyperdense 1.1 cm anterior liver focus, not clearly visualized on prior studies. Suggest MRI abdomen without and with IV contrast for further characterization.   10/20/2019 - 12/29/2019 Chemotherapy   The patient had ondansetron (ZOFRAN) injection 8 mg, 8 mg (100 % of original dose 8 mg), Intravenous,  Once, 2 of 5 cycles Dose modification: 8 mg (original dose 8 mg, Cycle 2) Administration: 8 mg (11/17/2019), 8 mg (12/15/2019), 8 mg (12/29/2019) gemcitabine (GEMZAR) 2,000 mg in sodium chloride 0.9 % 250 mL chemo infusion, 2,090 mg, Intravenous,  Once, 3 of 6 cycles Administration: 2,000 mg (10/20/2019), 2,000 mg (11/03/2019), 2,000 mg (11/17/2019), 2,000 mg (12/15/2019), 2,000 mg (12/29/2019)  for chemotherapy treatment.    06/12/2020 Imaging   1. Enlarging superficial, exophytic component of the chronic right sternocleidomastoid muscle mass. See series 6, image 55. 2. Elsewhere stable CT appearance of the Neck.    06/12/2020 Imaging   Post treatment scarring in the left hemithorax, stable. No evidence recurrent or metastatic disease   06/14/2020 -  Chemotherapy   The patient had palonosetron (ALOXI) injection 0.25 mg, 0.25 mg, Intravenous,  Once, 0 of 3 cycles fosaprepitant (EMEND) 150 mg in sodium chloride 0.9 % 145 mL IVPB, 150 mg, Intravenous,  Once, 0 of 3 cycles pembrolizumab (KEYTRUDA) 200 mg in sodium chloride 0.9 % 50 mL chemo infusion, 200 mg (100 % of original dose 200 mg), Intravenous, Once, 0 of 3 cycles Dose modification: 200 mg (original dose 200 mg, Cycle 1, Reason: Provider Judgment) fluorouracil (ADRUCIL) 6,750 mg in sodium chloride 0.9 % 115 mL chemo infusion, 800 mg/m2/day = 6,750 mg (100 % of original dose 800 mg/m2/day), Intravenous, 4D (96 hours ), 0 of 3 cycles Dose modification: 800 mg/m2/day (original dose 800 mg/m2/day, Cycle 1, Reason: Provider Judgment) CARBOplatin (PARAPLATIN) 750 mg in sodium chloride 0.9 % 250 mL chemo infusion, 750 mg (100 % of original dose 750 mg), Intravenous,  Once, 0 of 3 cycles Dose modification:   (original dose 750  mg, Cycle 1)  for chemotherapy treatment.      REVIEW OF SYSTEMS:   Constitutional: Denies fevers, chills or abnormal weight loss Eyes: Denies blurriness of vision Ears, nose, mouth, throat, and face: Denies mucositis or sore throat Respiratory: Denies cough, dyspnea or wheezes Cardiovascular: Denies palpitation, chest discomfort or lower extremity swelling Gastrointestinal:  Denies nausea, heartburn or change in bowel habits Skin: Denies abnormal skin rashes Lymphatics: Denies new lymphadenopathy or easy bruising Neurological:Denies numbness, tingling or new weaknesses Behavioral/Psych: Mood is stable, no new changes  All other systems were reviewed with the patient and are negative.  I have reviewed the past medical history, past surgical history, social history and family history with the patient and they are unchanged from  previous note.  ALLERGIES:  is allergic to phenergan [promethazine hcl], heparin, and clindamycin.  MEDICATIONS:  Current Outpatient Medications  Medication Sig Dispense Refill  . atorvastatin (LIPITOR) 40 MG tablet TK 1 T PO  D    . cetirizine (ZYRTEC) 10 MG tablet TAKE 1 TABLET(10 MG) BY MOUTH DAILY 30 tablet 1  . ibuprofen (ADVIL) 600 MG tablet TK 1 T PO Q 6 H PRN    . levothyroxine (SYNTHROID) 137 MCG tablet Take 1 tablet (137 mcg total) by mouth daily before breakfast. 90 tablet 9  . lidocaine (XYLOCAINE) 2 % solution Use as directed 5 mLs in the mouth or throat every 3 (three) hours as needed for mouth pain. Swish, gargle and spit 100 mL 2  . LORazepam (ATIVAN) 1 MG tablet Take 1 tablet (1 mg total) by mouth every 8 (eight) hours as needed for anxiety. 30 tablet 3  . omeprazole (PRILOSEC) 20 MG capsule Take 1 capsule (20 mg total) by mouth daily. 30 capsule 11  . ondansetron (ZOFRAN) 8 MG tablet Take 1 tablet (8 mg total) by mouth every 8 (eight) hours as needed for nausea or vomiting. 90 tablet 1  . oxyCODONE (ROXICODONE) 15 MG immediate release tablet Take 1 tablet (15 mg total) by mouth every 4 (four) hours as needed. 90 tablet 0  . PARoxetine (PAXIL) 20 MG tablet Take 1 tablet (20 mg total) by mouth daily. 30 tablet 5  . polyethylene glycol (MIRALAX) packet Take 17 g by mouth daily. (Patient taking differently: Take 17 g by mouth daily as needed for mild constipation or moderate constipation. ) 14 each 3  . prochlorperazine (COMPAZINE) 10 MG tablet Take 1 tablet (10 mg total) by mouth every 12 (twelve) hours as needed (Nausea or vomiting). 30 tablet 1  . senna-docusate (SENOKOT-S) 8.6-50 MG tablet Take 2 tablets by mouth 3 (three) times daily. 90 tablet 0  . Vitamin D, Ergocalciferol, (DRISDOL) 1.25 MG (50000 UT) CAPS capsule TK 1 C PO WEEKLY     No current facility-administered medications for this visit.   Facility-Administered Medications Ordered in Other Visits  Medication Dose  Route Frequency Provider Last Rate Last Admin  . anticoagulant sodium citrate solution 5 mL  5 mL Intracatheter Once Patrcia Schnepp, MD      . anticoagulant sodium citrate solution 5 mL  5 mL Intracatheter Once Alvy Bimler, Naiomy Watters, MD        PHYSICAL EXAMINATION: ECOG PERFORMANCE STATUS: 1 - Symptomatic but completely ambulatory  Vitals:   06/13/20 1234  BP: 112/73  Pulse: 79  Resp: 18  Temp: 98.2 F (36.8 C)  SpO2: 99%   Filed Weights   06/13/20 1234  Weight: 200 lb 8 oz (90.9 kg)    GENERAL:alert, no distress  and comfortable NEURO: alert & oriented x 3 with fluent speech, no focal motor/sensory deficits  LABORATORY DATA:  I have reviewed the data as listed    Component Value Date/Time   NA 140 06/12/2020 1345   NA 139 09/22/2017 0829   K 3.7 06/12/2020 1345   K 3.5 09/22/2017 0829   CL 104 06/12/2020 1345   CO2 28 06/12/2020 1345   CO2 26 09/22/2017 0829   GLUCOSE 107 (H) 06/12/2020 1345   GLUCOSE 133 09/22/2017 0829   BUN 12 06/12/2020 1345   BUN 14.1 09/22/2017 0829   CREATININE 0.92 06/12/2020 1345   CREATININE 0.93 02/06/2019 1215   CREATININE 0.9 09/22/2017 0829   CALCIUM 9.4 06/12/2020 1345   CALCIUM 9.1 09/22/2017 0829   PROT 7.0 06/12/2020 1345   PROT 6.8 09/22/2017 0829   ALBUMIN 4.1 06/12/2020 1345   ALBUMIN 4.1 09/22/2017 0829   AST 24 06/12/2020 1345   AST 31 02/06/2019 1215   AST 22 09/22/2017 0829   ALT 24 06/12/2020 1345   ALT 48 (H) 02/06/2019 1215   ALT 30 09/22/2017 0829   ALKPHOS 62 06/12/2020 1345   ALKPHOS 55 09/22/2017 0829   BILITOT 0.3 06/12/2020 1345   BILITOT 0.2 (L) 02/06/2019 1215   BILITOT 0.35 09/22/2017 0829   GFRNONAA >60 06/12/2020 1345   GFRNONAA >60 02/06/2019 1215   GFRAA >60 06/12/2020 1345   GFRAA >60 02/06/2019 1215    No results found for: SPEP, UPEP  Lab Results  Component Value Date   WBC 6.5 06/12/2020   NEUTROABS 4.2 06/12/2020   HGB 13.7 06/12/2020   HCT 41.0 06/12/2020   MCV 88.6 06/12/2020   PLT 210  06/12/2020      Chemistry      Component Value Date/Time   NA 140 06/12/2020 1345   NA 139 09/22/2017 0829   K 3.7 06/12/2020 1345   K 3.5 09/22/2017 0829   CL 104 06/12/2020 1345   CO2 28 06/12/2020 1345   CO2 26 09/22/2017 0829   BUN 12 06/12/2020 1345   BUN 14.1 09/22/2017 0829   CREATININE 0.92 06/12/2020 1345   CREATININE 0.93 02/06/2019 1215   CREATININE 0.9 09/22/2017 0829      Component Value Date/Time   CALCIUM 9.4 06/12/2020 1345   CALCIUM 9.1 09/22/2017 0829   ALKPHOS 62 06/12/2020 1345   ALKPHOS 55 09/22/2017 0829   AST 24 06/12/2020 1345   AST 31 02/06/2019 1215   AST 22 09/22/2017 0829   ALT 24 06/12/2020 1345   ALT 48 (H) 02/06/2019 1215   ALT 30 09/22/2017 0829   BILITOT 0.3 06/12/2020 1345   BILITOT 0.2 (L) 02/06/2019 1215   BILITOT 0.35 09/22/2017 0829       RADIOGRAPHIC STUDIES: I have personally reviewed the radiological images as listed and agreed with the findings in the report. CT Soft Tissue Neck W Contrast  Result Date: 06/12/2020 CLINICAL DATA:  34 year old male with history of nasopharyngeal carcinoma in 2007, right neck recurrence, metastatic disease including lung metastases. Immunotherapy in 2019. Right neck mass is painful and enlarging. EXAM: CT NECK WITH CONTRAST TECHNIQUE: Multidetector CT imaging of the neck was performed using the standard protocol following the bolus administration of intravenous contrast. CONTRAST:  121mL OMNIPAQUE IOHEXOL 300 MG/ML SOLN in conjunction with contrast enhanced imaging of the chest reported separately. COMPARISON:  Neck CT 10/02/2019 and earlier. Chest CT today reported separately. FINDINGS: Pharynx and larynx: Larynx and pharynx soft tissue contours remain normal. Negative  parapharyngeal and retropharyngeal spaces. Salivary glands: Negative sublingual space. Right submandibular gland chronically atrophied or absent as before. Left submandibular gland and parotid glands appear stable and within normal limits.  Thyroid: Diminutive, negative. Lymph nodes: Chronic soft tissue mass inseparable from the right sternocleidomastoid muscle demonstrates significant enlargement since 10/02/2019 of an exophytic, superficial component encompassing about 32 mm (series 6, image 55). The deeper portion of the mass continues to have indistinct margins with the right sternocleidomastoid muscle, with abnormal enhancement tracking along an area of about 31 mm craniocaudal (see coronal image 83). Confluent soft tissue tracking from the mass to the posterior right carotid space at the angle of the mandible appears stable since 2017. Upper limits of normal left level 1A lymph node measuring 9 mm short axis has not significantly changed since 2017. And a small subcutaneous nodule near the midline of the thoracic inlet measuring 10 mm on series 6, image 99 has minimally increased since that time. No new or increasing cervical lymph nodes are identified. Vascular: Right IJ approach Port-A-Cath partially visible. 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 Other major vascular structures in the neck including the right carotid appears stable and patent. Limited intracranial: Negative. Visualized orbits: Negative. Mastoids and visualized paranasal sinuses: Stable left maxillary sinus mucous retention cyst, otherwise clear. Skeleton: No acute or suspicious osseous lesion identified in the neck. No changes to the right cervical vertebrae in proximity to the chronic sternocleidomastoid muscle lesion. Upper chest: Reported separately today. IMPRESSION: 1. Enlarging superficial, exophytic component of the chronic right sternocleidomastoid muscle mass. See series 6, image 55. 2. Elsewhere stable CT appearance of the Neck. 3. CT Chest today reported separately. Electronically Signed   By: Genevie Ann M.D.   On: 06/12/2020 15:52   CT CHEST W CONTRAST  Result Date: 06/12/2020 CLINICAL DATA:  Head neck cancer, assess treatment response.  EXAM: CT CHEST WITH CONTRAST TECHNIQUE: Multidetector CT imaging of the chest was performed during intravenous contrast administration. CONTRAST:  148mL OMNIPAQUE IOHEXOL 300 MG/ML  SOLN COMPARISON:  09/24/2019. FINDINGS: Cardiovascular: Right IJ Port-A-Cath terminates in the right atrium. Heart is enlarged. No pericardial effusion. Mediastinum/Nodes: No pathologically enlarged mediastinal, hilar or axillary lymph nodes. Esophagus is unremarkable. Lungs/Pleura: Scarring in the apices. Post treatment parenchymal retraction, bronchiectasis and architectural distortion in the perihilar left upper and left lower lobes. 5 mm peripheral left lower lobe nodule (1/76) and 4 mm peripheral right lower lobe nodule (1/79), unchanged. No pleural fluid. Airway is unremarkable. Upper Abdomen: Visualized portions of the liver, gallbladder, adrenal glands, kidneys, spleen, pancreas, stomach and bowel are grossly unremarkable. Musculoskeletal: No worrisome lytic or sclerotic lesions. 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 No evidence recurrent or metastatic disease. Electronically Signed   By: Lorin Picket M.D.   On: 06/12/2020 15:51

## 2020-06-13 NOTE — Assessment & Plan Note (Signed)
His TSH was elevated recently He admits he has missed several doses For now, I do not plan to adjust his Synthroid dose We will recheck in his next visit

## 2020-06-13 NOTE — Assessment & Plan Note (Signed)
CT imaging showed no evidence of metastatic disease elsewhere He is currently symptomatic and we will proceed with chemotherapy as scheduled tomorrow We discussed the risk, benefits, side effects of combination chemotherapy of carboplatin, 5-FU and pembrolizumab and he is in agreement to proceed He will receive G-CSF support I plan to reduce the dose of 5-FU upfront due to history of severe nausea vomiting He will also come here for 5 days after treatment for IV fluid support

## 2020-06-14 ENCOUNTER — Inpatient Hospital Stay: Payer: Medicaid Other

## 2020-06-14 ENCOUNTER — Other Ambulatory Visit: Payer: Self-pay | Admitting: Hematology and Oncology

## 2020-06-14 ENCOUNTER — Other Ambulatory Visit: Payer: Self-pay

## 2020-06-14 VITALS — BP 144/84 | HR 90 | Temp 98.2°F | Resp 18 | Wt 202.0 lb

## 2020-06-14 DIAGNOSIS — Z7189 Other specified counseling: Secondary | ICD-10-CM

## 2020-06-14 DIAGNOSIS — Z95828 Presence of other vascular implants and grafts: Secondary | ICD-10-CM

## 2020-06-14 DIAGNOSIS — C7800 Secondary malignant neoplasm of unspecified lung: Secondary | ICD-10-CM

## 2020-06-14 DIAGNOSIS — C760 Malignant neoplasm of head, face and neck: Secondary | ICD-10-CM

## 2020-06-14 DIAGNOSIS — Z5111 Encounter for antineoplastic chemotherapy: Secondary | ICD-10-CM | POA: Diagnosis not present

## 2020-06-14 DIAGNOSIS — Z23 Encounter for immunization: Secondary | ICD-10-CM

## 2020-06-14 MED ORDER — SODIUM CHLORIDE 0.9 % IV SOLN
750.0000 mg | Freq: Once | INTRAVENOUS | Status: AC
  Administered 2020-06-14: 750 mg via INTRAVENOUS
  Filled 2020-06-14: qty 75

## 2020-06-14 MED ORDER — SODIUM CHLORIDE 0.9 % IV SOLN
800.0000 mg/m2/d | INTRAVENOUS | Status: DC
  Administered 2020-06-14: 6750 mg via INTRAVENOUS
  Filled 2020-06-14: qty 135

## 2020-06-14 MED ORDER — SODIUM CHLORIDE 0.9 % IV SOLN
10.0000 mg | Freq: Once | INTRAVENOUS | Status: AC
  Administered 2020-06-14: 10 mg via INTRAVENOUS
  Filled 2020-06-14: qty 10

## 2020-06-14 MED ORDER — SODIUM CHLORIDE 0.9 % IV SOLN
200.0000 mg | Freq: Once | INTRAVENOUS | Status: AC
  Administered 2020-06-14: 200 mg via INTRAVENOUS
  Filled 2020-06-14: qty 8

## 2020-06-14 MED ORDER — OXYCODONE HCL 5 MG PO TABS
10.0000 mg | ORAL_TABLET | Freq: Once | ORAL | Status: AC
  Administered 2020-06-14: 10 mg via ORAL

## 2020-06-14 MED ORDER — SODIUM CHLORIDE 0.9 % IV SOLN
Freq: Once | INTRAVENOUS | Status: AC
  Filled 2020-06-14: qty 250

## 2020-06-14 MED ORDER — PALONOSETRON HCL INJECTION 0.25 MG/5ML
0.2500 mg | Freq: Once | INTRAVENOUS | Status: AC
  Administered 2020-06-14: 0.25 mg via INTRAVENOUS

## 2020-06-14 MED ORDER — SODIUM CHLORIDE 0.9 % IV SOLN
150.0000 mg | Freq: Once | INTRAVENOUS | Status: AC
  Administered 2020-06-14: 150 mg via INTRAVENOUS
  Filled 2020-06-14: qty 150

## 2020-06-14 MED ORDER — PALONOSETRON HCL INJECTION 0.25 MG/5ML
INTRAVENOUS | Status: AC
  Filled 2020-06-14: qty 5

## 2020-06-14 MED ORDER — OXYCODONE HCL 5 MG PO TABS
ORAL_TABLET | ORAL | Status: AC
Start: 2020-06-14 — End: ?
  Filled 2020-06-14: qty 2

## 2020-06-14 MED FILL — Dexamethasone Sodium Phosphate Inj 100 MG/10ML: INTRAMUSCULAR | Qty: 1 | Status: AC

## 2020-06-14 NOTE — Progress Notes (Signed)
   Covid-19 Vaccination Clinic  Name:  Christian Simmons    MRN: [id number] DOB: 04-15-1986  06/14/2020  [name] was observed post Covid-19 immunization for 15 minutes without incident. He was provided with Vaccine Information Sheet and instruction to access the V-Safe system.   [name] was instructed to call 911 with any severe reactions post vaccine: Marland Kitchen Difficulty breathing  . Swelling of face and throat  . A fast heartbeat  . A bad rash all over body  . Dizziness and weakness   Immunizations Administered    Name Date Dose VIS Date Route   Pfizer COVID-19 Vaccine 06/14/2020  9:52 AM 0.3 mL 11/29/2018 Intramuscular   Manufacturer: [employer]   Lot: [id number]   [place]: [id number]

## 2020-06-14 NOTE — Patient Instructions (Signed)
[facility] Discharge Instructions for Patients Receiving Chemotherapy  Today you received the following chemotherapy agents: pembrolizumab (keytruda)/carboplatin/fluorouracil (5FU).  To help prevent nausea and vomiting after your treatment, we encourage you to take your nausea medication as directed.   If you develop nausea and vomiting that is not controlled by your nausea medication, call the clinic.   BELOW ARE SYMPTOMS THAT SHOULD BE REPORTED IMMEDIATELY:  *FEVER GREATER THAN 100.5 F  *CHILLS WITH OR WITHOUT FEVER  NAUSEA AND VOMITING THAT IS NOT CONTROLLED WITH YOUR NAUSEA MEDICATION  *UNUSUAL SHORTNESS OF BREATH  *UNUSUAL BRUISING OR BLEEDING  TENDERNESS IN MOUTH AND THROAT WITH OR WITHOUT PRESENCE OF ULCERS  *URINARY PROBLEMS  *BOWEL PROBLEMS  UNUSUAL RASH Items with * indicate a potential emergency and should be followed up as soon as possible.  Feel free to call the clinic should you have any questions or concerns. The clinic phone number is [phone].  Please show the [facility] at check-in to the Emergency Department and triage nurse.  Pembrolizumab injection What is this medicine? PEMBROLIZUMAB (pem broe liz ue mab) is a monoclonal antibody. It is used to treat certain types of cancer. This medicine may be used for other purposes; ask your health care provider or pharmacist if you have questions. COMMON BRAND NAME(S): Keytruda What should I tell my health care provider before I take this medicine? They need to know if you have any of these conditions:  diabetes  immune system problems  inflammatory bowel disease  liver disease  lung or breathing disease  lupus  received or scheduled to receive an organ transplant or a stem-cell transplant that uses donor stem cells  an unusual or allergic reaction to pembrolizumab, other medicines, foods, dyes, or preservatives  pregnant or trying to get pregnant  breast-feeding How  should I use this medicine? This medicine is for infusion into a vein. It is given by a health care professional in a hospital or clinic setting. A special MedGuide will be given to you before each treatment. Be sure to read this information carefully each time. Talk to your pediatrician regarding the use of this medicine in children. While this drug may be prescribed for children as young as 6 months for selected conditions, precautions do apply. Overdosage: If you think you have taken too much of this medicine contact a poison control center or emergency room at once. NOTE: This medicine is only for you. Do not share this medicine with others. What if I miss a dose? It is important not to miss your dose. Call your doctor or health care professional if you are unable to keep an appointment. What may interact with this medicine? Interactions have not been studied. Give your health care provider a list of all the medicines, herbs, non-prescription drugs, or dietary supplements you use. Also tell them if you smoke, drink alcohol, or use illegal drugs. Some items may interact with your medicine. This list may not describe all possible interactions. Give your health care provider a list of all the medicines, herbs, non-prescription drugs, or dietary supplements you use. Also tell them if you smoke, drink alcohol, or use illegal drugs. Some items may interact with your medicine. What should I watch for while using this medicine? Your condition will be monitored carefully while you are receiving this medicine. You may need blood work done while you are taking this medicine. Do not become pregnant while taking this medicine or for 4 months after stopping it.  Women should inform their doctor if they wish to become pregnant or think they might be pregnant. There is a potential for serious side effects to an unborn child. Talk to your health care professional or pharmacist for more information. Do not  breast-feed an infant while taking this medicine or for 4 months after the last dose. What side effects may I notice from receiving this medicine? Side effects that you should report to your doctor or health care professional as soon as possible:  allergic reactions like skin rash, itching or hives, swelling of the face, lips, or tongue  bloody or black, tarry  breathing problems  changes in vision  chest pain  chills  confusion  constipation  cough  diarrhea  dizziness or feeling faint or lightheaded  fast or irregular heartbeat  fever  flushing  joint pain  low blood counts - this medicine may decrease the number of white blood cells, red blood cells and platelets. You may be at increased risk for infections and bleeding.  muscle pain  muscle weakness  pain, tingling, numbness in the hands or feet  persistent headache  redness, blistering, peeling or loosening of the skin, including inside the mouth  signs and symptoms of high blood sugar such as dizziness; dry mouth; dry skin; fruity breath; nausea; stomach pain; increased hunger or thirst; increased urination  signs and symptoms of kidney injury like trouble passing urine or change in the amount of urine  signs and symptoms of liver injury like dark urine, light-colored stools, loss of appetite, nausea, right upper belly pain, yellowing of the eyes or skin  sweating  swollen lymph nodes  weight loss Side effects that usually do not require medical attention (report to your doctor or health care professional if they continue or are bothersome):  decreased appetite  hair loss  muscle pain  tiredness This list may not describe all possible side effects. Call your doctor for medical advice about side effects. You may report side effects to FDA at 1-800-FDA-1088. Where should I keep my medicine? This drug is given in a hospital or clinic and will not be stored at home. NOTE: This sheet is a summary.  It may not cover all possible information. If you have questions about this medicine, talk to your doctor, pharmacist, or health care provider.  2020 Elsevier/Gold Standard (2019-07-28 18:07:58)  Carboplatin injection What is this medicine? CARBOPLATIN (KAR boe pla tin) is a chemotherapy drug. It targets fast dividing cells, like cancer cells, and causes these cells to die. This medicine is used to treat ovarian cancer and many other cancers. This medicine may be used for other purposes; ask your health care provider or pharmacist if you have questions. COMMON BRAND NAME(S): Paraplatin What should I tell my health care provider before I take this medicine? They need to know if you have any of these conditions:  blood disorders  hearing problems  kidney disease  recent or ongoing radiation therapy  an unusual or allergic reaction to carboplatin, cisplatin, other chemotherapy, other medicines, foods, dyes, or preservatives  pregnant or trying to get pregnant  breast-feeding How should I use this medicine? This drug is usually given as an infusion into a vein. It is administered in a hospital or clinic by a specially trained health care professional. Talk to your pediatrician regarding the use of this medicine in children. Special care may be needed. Overdosage: If you think you have taken too much of this medicine contact a poison control center  or emergency room at once. NOTE: This medicine is only for you. Do not share this medicine with others. What if I miss a dose? It is important not to miss a dose. Call your doctor or health care professional if you are unable to keep an appointment. What may interact with this medicine?  medicines for seizures  medicines to increase blood counts like filgrastim, pegfilgrastim, sargramostim  some antibiotics like amikacin, gentamicin, neomycin, streptomycin, tobramycin  vaccines Talk to your doctor or health care professional before  taking any of these medicines:  acetaminophen  aspirin  ibuprofen  ketoprofen  naproxen This list may not describe all possible interactions. Give your health care provider a list of all the medicines, herbs, non-prescription drugs, or dietary supplements you use. Also tell them if you smoke, drink alcohol, or use illegal drugs. Some items may interact with your medicine. What should I watch for while using this medicine? Your condition will be monitored carefully while you are receiving this medicine. You will need important blood work done while you are taking this medicine. This drug may make you feel generally unwell. This is not uncommon, as chemotherapy can affect healthy cells as well as cancer cells. Report any side effects. Continue your course of treatment even though you feel ill unless your doctor tells you to stop. In some cases, you may be given additional medicines to help with side effects. Follow all directions for their use. Call your doctor or health care professional for advice if you get a fever, chills or sore throat, or other symptoms of a cold or flu. Do not treat yourself. This drug decreases your body's ability to fight infections. Try to avoid being around people who are sick. This medicine may increase your risk to bruise or bleed. Call your doctor or health care professional if you notice any unusual bleeding. Be careful brushing and flossing your teeth or using a toothpick because you may get an infection or bleed more easily. If you have any dental work done, tell your dentist you are receiving this medicine. Avoid taking products that contain aspirin, acetaminophen, ibuprofen, naproxen, or ketoprofen unless instructed by your doctor. These medicines may hide a fever. Do not become pregnant while taking this medicine. Women should inform their doctor if they wish to become pregnant or think they might be pregnant. There is a potential for serious side effects to an  unborn child. Talk to your health care professional or pharmacist for more information. Do not breast-feed an infant while taking this medicine. What side effects may I notice from receiving this medicine? Side effects that you should report to your doctor or health care professional as soon as possible:  allergic reactions like skin rash, itching or hives, swelling of the face, lips, or tongue  signs of infection - fever or chills, cough, sore throat, pain or difficulty passing urine  signs of decreased platelets or bleeding - bruising, pinpoint red spots on the skin, black, tarry stools, nosebleeds  signs of decreased red blood cells - unusually weak or tired, fainting spells, lightheadedness  breathing problems  changes in hearing  changes in vision  chest pain  high blood pressure  low blood counts - This drug may decrease the number of white blood cells, red blood cells and platelets. You may be at increased risk for infections and bleeding.  nausea and vomiting  pain, swelling, redness or irritation at the injection site  pain, tingling, numbness in the hands or feet  problems with balance, talking, walking  trouble passing urine or change in the amount of urine Side effects that usually do not require medical attention (report to your doctor or health care professional if they continue or are bothersome):  hair loss  loss of appetite  metallic taste in the mouth or changes in taste This list may not describe all possible side effects. Call your doctor for medical advice about side effects. You may report side effects to FDA at 1-800-FDA-1088. Where should I keep my medicine? This drug is given in a hospital or clinic and will not be stored at home. NOTE: This sheet is a summary. It may not cover all possible information. If you have questions about this medicine, talk to your doctor, pharmacist, or health care provider.  2020 Elsevier/Gold Standard (2007-12-27  14:38:05)  Fluorouracil, 5-FU injection What is this medicine? FLUOROURACIL, 5-FU (flure oh YOOR a sil) is a chemotherapy drug. It slows the growth of cancer cells. This medicine is used to treat many types of cancer like breast cancer, colon or rectal cancer, pancreatic cancer, and stomach cancer. This medicine may be used for other purposes; ask your health care provider or pharmacist if you have questions. COMMON BRAND NAME(S): Adrucil What should I tell my health care provider before I take this medicine? They need to know if you have any of these conditions:  blood disorders  dihydropyrimidine dehydrogenase (DPD) deficiency  infection (especially a virus infection such as chickenpox, cold sores, or herpes)  kidney disease  liver disease  malnourished, poor nutrition  recent or ongoing radiation therapy  an unusual or allergic reaction to fluorouracil, other chemotherapy, other medicines, foods, dyes, or preservatives  pregnant or trying to get pregnant  breast-feeding How should I use this medicine? This drug is given as an infusion or injection into a vein. It is administered in a hospital or clinic by a specially trained health care professional. Talk to your pediatrician regarding the use of this medicine in children. Special care may be needed. Overdosage: If you think you have taken too much of this medicine contact a poison control center or emergency room at once. NOTE: This medicine is only for you. Do not share this medicine with others. What if I miss a dose? It is important not to miss your dose. Call your doctor or health care professional if you are unable to keep an appointment. What may interact with this medicine?  allopurinol  cimetidine  dapsone  digoxin  hydroxyurea  leucovorin  levamisole  medicines for seizures like ethotoin, fosphenytoin, phenytoin  medicines to increase blood counts like filgrastim, pegfilgrastim,  sargramostim  medicines that treat or prevent blood clots like warfarin, enoxaparin, and dalteparin  methotrexate  metronidazole  pyrimethamine  some other chemotherapy drugs like busulfan, cisplatin, estramustine, vinblastine  trimethoprim  trimetrexate  vaccines Talk to your doctor or health care professional before taking any of these medicines:  acetaminophen  aspirin  ibuprofen  ketoprofen  naproxen This list may not describe all possible interactions. Give your health care provider a list of all the medicines, herbs, non-prescription drugs, or dietary supplements you use. Also tell them if you smoke, drink alcohol, or use illegal drugs. Some items may interact with your medicine. What should I watch for while using this medicine? Visit your doctor for checks on your progress. This drug may make you feel generally unwell. This is not uncommon, as chemotherapy can affect healthy cells as well as cancer cells. Report any side  effects. Continue your course of treatment even though you feel ill unless your doctor tells you to stop. In some cases, you may be given additional medicines to help with side effects. Follow all directions for their use. Call your doctor or health care professional for advice if you get a fever, chills or sore throat, or other symptoms of a cold or flu. Do not treat yourself. This drug decreases your body's ability to fight infections. Try to avoid being around people who are sick. This medicine may increase your risk to bruise or bleed. Call your doctor or health care professional if you notice any unusual bleeding. Be careful brushing and flossing your teeth or using a toothpick because you may get an infection or bleed more easily. If you have any dental work done, tell your dentist you are receiving this medicine. Avoid taking products that contain aspirin, acetaminophen, ibuprofen, naproxen, or ketoprofen unless instructed by your doctor. These  medicines may hide a fever. Do not become pregnant while taking this medicine. Women should inform their doctor if they wish to become pregnant or think they might be pregnant. There is a potential for serious side effects to an unborn child. Talk to your health care professional or pharmacist for more information. Do not breast-feed an infant while taking this medicine. Men should inform their doctor if they wish to father a child. This medicine may lower sperm counts. Do not treat diarrhea with over the counter products. Contact your doctor if you have diarrhea that lasts more than 2 days or if it is severe and watery. This medicine can make you more sensitive to the sun. Keep out of the sun. If you cannot avoid being in the sun, wear protective clothing and use sunscreen. Do not use sun lamps or tanning beds/booths. What side effects may I notice from receiving this medicine? Side effects that you should report to your doctor or health care professional as soon as possible:  allergic reactions like skin rash, itching or hives, swelling of the face, lips, or tongue  low blood counts - this medicine may decrease the number of white blood cells, red blood cells and platelets. You may be at increased risk for infections and bleeding.  signs of infection - fever or chills, cough, sore throat, pain or difficulty passing urine  signs of decreased platelets or bleeding - bruising, pinpoint red spots on the skin, black, tarry stools, blood in the urine  signs of decreased red blood cells - unusually weak or tired, fainting spells, lightheadedness  breathing problems  changes in vision  chest pain  mouth sores  nausea and vomiting  pain, swelling, redness at site where injected  pain, tingling, numbness in the hands or feet  redness, swelling, or sores on hands or feet  stomach pain  unusual bleeding Side effects that usually do not require medical attention (report to your doctor or  health care professional if they continue or are bothersome):  changes in finger or toe nails  diarrhea  dry or itchy skin  hair loss  headache  loss of appetite  sensitivity of eyes to the light  stomach upset  unusually teary eyes This list may not describe all possible side effects. Call your doctor for medical advice about side effects. You may report side effects to FDA at 1-800-FDA-1088. Where should I keep my medicine? This drug is given in a hospital or clinic and will not be stored at home. NOTE: This sheet is a summary.  It may not cover all possible information. If you have questions about this medicine, talk to your doctor, pharmacist, or health care provider.  2020 Elsevier/Gold Standard (2008-01-25 13:53:16)

## 2020-06-15 ENCOUNTER — Inpatient Hospital Stay: Payer: Medicaid Other

## 2020-06-17 ENCOUNTER — Ambulatory Visit: Payer: Medicaid Other

## 2020-06-17 ENCOUNTER — Encounter (HOSPITAL_COMMUNITY): Payer: Self-pay | Admitting: Emergency Medicine

## 2020-06-17 ENCOUNTER — Inpatient Hospital Stay: Payer: Medicaid Other

## 2020-06-17 ENCOUNTER — Other Ambulatory Visit: Payer: Self-pay

## 2020-06-17 ENCOUNTER — Emergency Department (HOSPITAL_COMMUNITY)
Admission: EM | Admit: 2020-06-17 | Discharge: 2020-06-17 | Disposition: A | Discharge status: Home / Self Care | Payer: Medicaid Other | Attending: Emergency Medicine | Admitting: Emergency Medicine

## 2020-06-17 ENCOUNTER — Telehealth: Payer: Self-pay

## 2020-06-17 DIAGNOSIS — Z79899 Other long term (current) drug therapy: Secondary | ICD-10-CM | POA: Insufficient documentation

## 2020-06-17 DIAGNOSIS — E039 Hypothyroidism, unspecified: Secondary | ICD-10-CM | POA: Diagnosis not present

## 2020-06-17 DIAGNOSIS — Z8589 Personal history of malignant neoplasm of other organs and systems: Secondary | ICD-10-CM | POA: Diagnosis not present

## 2020-06-17 DIAGNOSIS — Z859 Personal history of malignant neoplasm, unspecified: Secondary | ICD-10-CM | POA: Diagnosis not present

## 2020-06-17 DIAGNOSIS — E1140 Type 2 diabetes mellitus with diabetic neuropathy, unspecified: Secondary | ICD-10-CM | POA: Diagnosis not present

## 2020-06-17 DIAGNOSIS — Z79890 Hormone replacement therapy (postmenopausal): Secondary | ICD-10-CM | POA: Insufficient documentation

## 2020-06-17 DIAGNOSIS — F419 Anxiety disorder, unspecified: Secondary | ICD-10-CM | POA: Diagnosis present

## 2020-06-17 DIAGNOSIS — R110 Nausea: Secondary | ICD-10-CM | POA: Insufficient documentation

## 2020-06-17 LAB — CBC WITH DIFFERENTIAL/PLATELET
Abs Immature Granulocytes: 0.03 10*3/uL (ref 0.00–0.07)
Basophils Absolute: 0 10*3/uL (ref 0.0–0.1)
Basophils Relative: 0 %
Eosinophils Absolute: 0.1 10*3/uL (ref 0.0–0.5)
Eosinophils Relative: 1 %
HCT: 44.3 % (ref 39.0–52.0)
Hemoglobin: 14.9 g/dL (ref 13.0–17.0)
Immature Granulocytes: 0 %
Lymphocytes Relative: 15 %
Lymphs Abs: 1.5 10*3/uL (ref 0.7–4.0)
MCH: 29.8 pg (ref 26.0–34.0)
MCHC: 33.6 g/dL (ref 30.0–36.0)
MCV: 88.6 fL (ref 80.0–100.0)
Monocytes Absolute: 0.7 10*3/uL (ref 0.1–1.0)
Monocytes Relative: 7 %
Neutro Abs: 7.6 10*3/uL (ref 1.7–7.7)
Neutrophils Relative %: 77 %
Platelets: 219 10*3/uL (ref 150–400)
RBC: 5 MIL/uL (ref 4.22–5.81)
RDW: 14.6 % (ref 11.5–15.5)
WBC: 9.8 10*3/uL (ref 4.0–10.5)
nRBC: 0 % (ref 0.0–0.2)

## 2020-06-17 LAB — COMPREHENSIVE METABOLIC PANEL
ALT: 24 U/L (ref 0–44)
AST: 20 U/L (ref 15–41)
Albumin: 4.4 g/dL (ref 3.5–5.0)
Alkaline Phosphatase: 61 U/L (ref 38–126)
Anion gap: 11 (ref 5–15)
BUN: 13 mg/dL (ref 6–20)
CO2: 28 mmol/L (ref 22–32)
Calcium: 9.1 mg/dL (ref 8.9–10.3)
Chloride: 97 mmol/L — ABNORMAL LOW (ref 98–111)
Creatinine, Ser: 0.91 mg/dL (ref 0.61–1.24)
GFR calc Af Amer: >60 mL/min (ref >60)
GFR calc non Af Amer: >60 mL/min (ref >60)
Glucose, Bld: 119 mg/dL — ABNORMAL HIGH (ref 70–99)
Potassium: 3.5 mmol/L (ref 3.5–5.1)
Sodium: 136 mmol/L (ref 135–145)
Total Bilirubin: 0.5 mg/dL (ref 0.3–1.2)
Total Protein: 7.4 g/dL (ref 6.5–8.1)

## 2020-06-17 MED ORDER — LACTATED RINGERS IV BOLUS
1000.0000 mL | Freq: Once | INTRAVENOUS | Status: AC
  Administered 2020-06-17: 1000 mL via INTRAVENOUS

## 2020-06-17 MED ORDER — HYDROXYZINE HCL 25 MG PO TABS: 25.0000 mg | ORAL_TABLET | Freq: Once | ORAL | Status: DC

## 2020-06-17 MED ORDER — LORAZEPAM 1 MG PO TABS: 1.0000 mg | ORAL_TABLET | Freq: Three times a day (TID) | ORAL | 0 refills | Status: DC | PRN

## 2020-06-17 MED ORDER — DIPHENHYDRAMINE HCL 50 MG/ML IJ SOLN
25.0000 mg | Freq: Once | INTRAMUSCULAR | Status: DC
  Filled 2020-06-17: qty 1

## 2020-06-17 MED ORDER — DIPHENHYDRAMINE HCL 50 MG/ML IJ SOLN
25.0000 mg | Freq: Once | INTRAMUSCULAR | Status: AC
  Administered 2020-06-17: 25 mg via INTRAMUSCULAR

## 2020-06-17 NOTE — Telephone Encounter (Signed)
Returned called to follow up after new treatment Friday. He went to the ER earlier today for anxiety and still does not feel well. Appt scheduled at 1200 with Dr. Alvy Bimler, she will see in the infusion room. He is aware of the time.

## 2020-06-17 NOTE — ED Triage Notes (Signed)
Patient feels nervous and anxious for about 10 hrs. Patient has been pacing for 10 hours. No symptoms or pain.

## 2020-06-17 NOTE — ED Notes (Signed)
Unable to establish IV access.  Charge RN asked to attempt. EDP notified.

## 2020-06-17 NOTE — ED Notes (Signed)
Pt ambulated to restroom w/ slight assist.  C/o of sleepiness and being cold.   EDP at bedside.

## 2020-06-17 NOTE — Discharge Instructions (Signed)
Talk to your oncologist about potential medication adjustments.  This likely is either anxiety or reaction to the Compazine you have been taking.  Your work-up is very reassuring.

## 2020-06-17 NOTE — ED Notes (Signed)
Pt has chemo pump which is delivering medication to his port in his right chest.  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  Pt anxious and pacing room but was agreeable and thankful to lay on stretcher and be positioned with warm blankets for comfort.  Pt reports anxiety but states that it was worse pta, pt is resting on stretcher at this time, lights dimmed to further help pt relax.

## 2020-06-17 NOTE — ED Provider Notes (Signed)
[facility] DEPT Provider Note   CSN: [id number] Arrival date & time: 06/17/20  0423     History Chief Complaint  Patient presents with  . Anxiety    Christian Simmons is a 34 y.o. male.  HPI Patient presents with anxiety. States that last night began to feel bad. Happened around 7:00. States he just felt very nervous and anxious and was pacing around the room. States he felt this way before when he was on Decadron for his cancer. He is currently on chemotherapy but denies being on steroids. States he took some Zofran last night. Also took 3 Compazine's yesterday. Feeling somewhat better now laying in bed. No suicidal thoughts. States he has not been taking his thyroid medicine. States he had to call the ambulance last night. Reviewing records patient is currently on a chemotherapy pump.    Past Medical History:  Diagnosis Date  . Arrhythmia 12/25/2015  . Carcinoma ([place])   . 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 Fatigue 06/01/2014  . Hypothyroidism   . Infection of eyelash follicle of left eye 11/18/863  . Metastasis to lung ([place]) 06/01/2014  . 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 Neuropathy   . Radiation 07/18/14-07/31/14   Left upper lobe  40 gy in 10 fractions  . 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 Preventive measure 06/13/2020  . Liver lesion 10/05/2019  . Type 2 diabetes mellitus ([place]) 04/06/2019  . 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 Vitamin B12 deficiency 03/02/2019  . Pancytopenia, acquired ([place]) 11/11/2018  . 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 Nausea and vomiting 10/14/2018  . Goals of care, counseling/discussion 09/21/2018  . Cancer associated pain 09/08/2018  . Financial difficulties 11/26/2017  . Medically noncompliant 04/13/2017  . Port catheter in place 04/01/2016  .  Epigastric pain 02/10/2016  . Gastritis without bleeding 01/22/2016  . Arrhythmia 12/25/2015  . Infection of eyelash follicle of left eye 78/46/9629  . Poor dentition 10/16/2015  . Other constipation 05/16/2015  . Neck pain on right side 01/28/2015  . 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 Acquired hypothyroidism 06/09/2014  . Primary cancer of head and neck ([place]) 06/01/2014  . Metastasis to lung ([place]) 06/01/2014  . Chronic fatigue 06/01/2014    Past Surgical History:  Procedure Laterality Date  . LUNG REMOVAL, PARTIAL  05/03/2012   left upper lobectomy  . nasal biopsy    . RADICAL NECK DISSECTION    . VIDEO BRONCHOSCOPY N/A 06/29/2014   Procedure: VIDEO BRONCHOSCOPY ;  Surgeon: Melrose Nakayama, MD;  Location: [facility] OR;  Service: Thoracic;  Laterality: N/A;       Family History  Problem Relation Age of Onset  . Hypertension Mother   . Diabetes Mother   . Hypertension Father   . Diabetes Father     Social History   Tobacco Use  . Smoking status: Never Smoker  . Smokeless tobacco: Never Used  Vaping Use  . Vaping Use: Never used  Substance Use Topics  . Alcohol use: No  . Drug use: No    Home Medications Prior to Admission medications   Medication Sig Start Date End Date Taking? Authorizing Provider  atorvastatin (LIPITOR) 40 MG tablet TK 1 T PO  D 04/14/19   [provider]  cetirizine (ZYRTEC) 10 MG tablet  TAKE 1 TABLET(10 MG) BY MOUTH DAILY 06/12/20   [name], MD  ibuprofen (ADVIL) 600 MG tablet TK 1 T PO Q 6 H PRN 05/30/19   [provider]  levothyroxine (SYNTHROID) 137 MCG tablet Take 1 tablet (137 mcg total) by mouth daily before breakfast. 11/02/19   [name], MD  lidocaine (XYLOCAINE) 2 % solution Use as directed 5 mLs in the mouth or throat every 3 (three) hours as needed for mouth pain. Swish, gargle and spit 11/29/18   Tanner, Lyndon Code., PA-C  LORazepam (ATIVAN) 1 MG tablet Take 1 tablet (1 mg total) by mouth every 8  (eight) hours as needed for anxiety. 06/17/20   Davonna Belling, MD  omeprazole (PRILOSEC) 20 MG capsule Take 1 capsule (20 mg total) by mouth daily. 12/15/19   [name], MD  ondansetron (ZOFRAN) 8 MG tablet Take 1 tablet (8 mg total) by mouth every 8 (eight) hours as needed for nausea or vomiting. 06/13/20   [name], MD  oxyCODONE (ROXICODONE) 15 MG immediate release tablet Take 1 tablet (15 mg total) by mouth every 4 (four) hours as needed. 06/11/20   [name], MD  PARoxetine (PAXIL) 20 MG tablet Take 1 tablet (20 mg total) by mouth daily. 10/31/18   Tanner, Lyndon Code., PA-C  polyethylene glycol [facility]) packet Take 17 g by mouth daily. Patient taking differently: Take 17 g by mouth daily as needed for mild constipation or moderate constipation.  03/17/18   [name], MD  prochlorperazine (COMPAZINE) 10 MG tablet Take 1 tablet (10 mg total) by mouth every 12 (twelve) hours as needed (Nausea or vomiting). 06/13/20   [name], MD  senna-docusate (SENOKOT-S) 8.6-50 MG tablet Take 2 tablets by mouth 3 (three) times daily. 11/04/18   [name], MD  Vitamin D, Ergocalciferol, (DRISDOL) 1.25 MG (50000 UT) CAPS capsule TK 1 C PO WEEKLY 03/30/19   [provider]    Allergies    Phenergan [promethazine hcl], Heparin, and Clindamycin  Review of Systems   Review of Systems  Constitutional: Positive for appetite change. Negative for fever.  HENT: Negative for congestion.   Respiratory: Negative for shortness of breath.   Cardiovascular: Negative for chest pain.  Gastrointestinal: Positive for nausea. Negative for abdominal pain and vomiting.  Genitourinary: Negative for flank pain.  Musculoskeletal: Negative for back pain.  Skin: Negative for rash.  Neurological: Negative for weakness.  Psychiatric/Behavioral: The patient is nervous/anxious.     Physical Exam Updated Vital Signs BP (!) 146/101   Pulse 75   Temp 97.9 F (36.6 C) (Oral)   Resp 18   Ht 5\' 9"  (1.753 m)   Wt 91.6  kg   SpO2 99%   BMI 29.83 kg/m   Physical Exam Vitals and nursing note reviewed.  HENT:     Head: Atraumatic.  Eyes:     General: No scleral icterus. Neck:     Comments: Soft tissue mass to lateral right neck. Cardiovascular:     Rate and Rhythm: Regular rhythm.  Pulmonary:     Breath sounds: No wheezing or rhonchi.  Musculoskeletal:        General: Normal range of motion.  Skin:    Capillary Refill: Capillary refill takes less than 2 seconds.  Neurological:     Mental Status: He is alert and oriented to person, place, and time. Mental status is at baseline.  Psychiatric:     Comments: Patient sitting in bed comfortably. Wrapped up in blankets.  ED Results / Procedures / Treatments   Labs (all labs ordered are listed, but only abnormal results are displayed) Labs Reviewed  COMPREHENSIVE METABOLIC PANEL - Abnormal; Notable for the following components:      Result Value   Chloride 97 (*)    Glucose, Bld 119 (*)    All other components within normal limits  CBC WITH DIFFERENTIAL/PLATELET    EKG None  Radiology No results found.  Procedures Procedures (including critical care time)  Medications Ordered in ED Medications  lactated ringers bolus 1,000 mL (1,000 mLs Intravenous New Bag/Given 06/17/20 0824)  diphenhydrAMINE (BENADRYL) injection 25 mg (25 mg Intramuscular Given 06/17/20 0800)    ED Course  I have reviewed the triage vital signs and the nursing notes.  Pertinent labs & imaging results that were available during my care of the patient were reviewed by me and considered in my medical decision making (see chart for details).    MDM Rules/Calculators/A&P                         Patient presents feeling nervous and anxious.  Began last night.  States he has had this with steroids in the past but does not appear to be on steroids at this time.  Patient did however take Compazine which potentially this could be an akathisia since he states he was  anxious and up and walking around.  Initially were going to try oral Vistaril, patient however states he was very anxious.  With recent chemotherapy and current chemotherapy however fluid boluses and lab work were going to be done.  Some difficulty getting IV line however.  IM Benadryl given instead.  Reevaluation after this patient sleeping comfortably in the room.  Patient resting comfortably.  Began to complain about pain to his right neck.  Given his home pain medicines that he did not take them this morning.  Then began a little more anxious.  Reassured patient and will be discharged home.  I think that is either anxiety versus potentially akathisia from his Compazine.  Can follow with his oncologist.  Given short course of Ativan for home, which he has had previously. Final Clinical Impression(s) / ED Diagnoses Final diagnoses:  Anxiety    Rx / DC Orders ED Discharge Orders         Ordered    LORazepam (ATIVAN) 1 MG tablet  Every 8 hours PRN        06/17/20 5597           Davonna Belling, MD 06/17/20 [phone]

## 2020-06-18 ENCOUNTER — Other Ambulatory Visit: Payer: Self-pay

## 2020-06-18 ENCOUNTER — Inpatient Hospital Stay (HOSPITAL_BASED_OUTPATIENT_CLINIC_OR_DEPARTMENT_OTHER): Payer: Medicaid Other | Admitting: Hematology and Oncology

## 2020-06-18 ENCOUNTER — Encounter: Payer: Self-pay | Admitting: Hematology and Oncology

## 2020-06-18 ENCOUNTER — Inpatient Hospital Stay: Payer: Medicaid Other

## 2020-06-18 VITALS — BP 135/70 | HR 80 | Temp 98.1°F | Resp 18 | Ht 69.0 in | Wt 196.5 lb

## 2020-06-18 DIAGNOSIS — C760 Malignant neoplasm of head, face and neck: Secondary | ICD-10-CM | POA: Diagnosis not present

## 2020-06-18 DIAGNOSIS — Z7189 Other specified counseling: Secondary | ICD-10-CM

## 2020-06-18 DIAGNOSIS — Z95828 Presence of other vascular implants and grafts: Secondary | ICD-10-CM

## 2020-06-18 DIAGNOSIS — R112 Nausea with vomiting, unspecified: Secondary | ICD-10-CM | POA: Diagnosis not present

## 2020-06-18 DIAGNOSIS — F411 Generalized anxiety disorder: Secondary | ICD-10-CM | POA: Insufficient documentation

## 2020-06-18 DIAGNOSIS — Z5111 Encounter for antineoplastic chemotherapy: Secondary | ICD-10-CM | POA: Diagnosis not present

## 2020-06-18 DIAGNOSIS — C7800 Secondary malignant neoplasm of unspecified lung: Secondary | ICD-10-CM

## 2020-06-18 MED ORDER — LORAZEPAM 1 MG PO TABS: 1.0000 mg | ORAL_TABLET | Freq: Three times a day (TID) | ORAL | 0 refills | Status: DC | PRN

## 2020-06-18 MED ORDER — FAMOTIDINE 20 MG PO TABS: 20.0000 mg | ORAL_TABLET | Freq: Once | ORAL | Status: DC

## 2020-06-18 MED ORDER — SODIUM CHLORIDE 0.9 % IV SOLN
Freq: Once | INTRAVENOUS | Status: AC
  Filled 2020-06-18: qty 250

## 2020-06-18 MED ORDER — FAMOTIDINE 20 MG PO TABS
ORAL_TABLET | ORAL | Status: AC
  Filled 2020-06-18: qty 1

## 2020-06-18 MED ORDER — SODIUM CHLORIDE 0.9% FLUSH
10.0000 mL | INTRAVENOUS | Status: DC | PRN
  Administered 2020-06-18: 10 mL
  Filled 2020-06-18: qty 10

## 2020-06-18 MED ORDER — FAMOTIDINE 20 MG PO TABS
20.0000 mg | ORAL_TABLET | Freq: Once | ORAL | Status: AC
  Administered 2020-06-18: 20 mg via ORAL

## 2020-06-18 MED ORDER — ANTICOAGULANT SODIUM CITRATE 4% (200MG/5ML) IV SOLN
5.0000 mL | Freq: Once | Status: AC
  Administered 2020-06-18: 5 mL
  Filled 2020-06-18: qty 5

## 2020-06-18 MED FILL — Dexamethasone Sodium Phosphate Inj 100 MG/10ML: INTRAMUSCULAR | Qty: 1 | Status: AC

## 2020-06-18 NOTE — Assessment & Plan Note (Signed)
He is coming in for daily IV fluid support He denies significant nausea with this cycle of treatment We will observe He can take antiemetics as needed We will also give him some Pepcid for heartburn

## 2020-06-18 NOTE — Progress Notes (Signed)
Pt. Here today for IV fluids Pt. Feeling very anxious and pacing back and forth. Pump d/c'd and IV fluids given. Pt. Seen by Dr. Alvy Bimler. Pt had a c/o burning in his chest per Dr. Alvy Bimler pepcid 20 mg given PO. Pt asked if fluids could be sped up per Dr Alvy Bimler ok. During this time Pt decided that he did not want to continue fluids. Fluids stopped port de-accessed. Pt received only 796 of liter fluid ordered. V/S stable

## 2020-06-18 NOTE — Assessment & Plan Note (Signed)
He went to the emergency department recently with generalized anxiety He was prescribed lorazepam but did not fill the prescription I recommend he start taking it I sent the prescription to his local pharmacy

## 2020-06-18 NOTE — Patient Instructions (Signed)

## 2020-06-18 NOTE — Progress Notes (Signed)
[place] OFFICE PROGRESS NOTE  Patient Care Team: Patient, No Pcp Per as PCP - General (General Practice) [name], MD as Consulting Physician (Hematology and Oncology) Eppie Gibson, MD as Attending Physician (Radiation Oncology) Jodi Marble, MD as Consulting Physician (Otolaryngology) Philomena Doheny, MD as Referring Physician (Plastic Surgery) Irene Shipper, MD as Consulting Physician (Gastroenterology) System, Provider Not In  ASSESSMENT & PLAN:  Primary cancer of head and neck [facility]) He tolerated treatment poorly with significant anxiety and akathisia after treatment I plan to reduce future premed dexamethasone He will continue supportive care He felt that overall, the pressure of the tumor on his neck has improved since he started on treatment  Anxiety, generalized He went to the emergency department recently with generalized anxiety He was prescribed lorazepam but did not fill the prescription I recommend he start taking it I sent the prescription to his local pharmacy  Nausea and vomiting He is coming in for daily IV fluid support He denies significant nausea with this cycle of treatment We will observe He can take antiemetics as needed We will also give him some Pepcid for heartburn   No orders of the defined types were placed in this encounter.   All questions were answered. The patient knows to call the clinic with any problems, questions or concerns. The total time spent in the appointment was 20 minutes encounter with patients including review of chart and various tests results, discussions about plan of care and coordination of care plan   [name], MD 06/18/2020 1:36 PM  INTERVAL HISTORY: Please see below for problem oriented charting. 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 Pan-endoscopy elsewhere showed cancer from nasopharyngeal space.   01/04/2007 - 02/20/2007 Chemotherapy   He received 2 cycles of cisplatin and 5FU followed by concurrent chemo with weekly cisplatin and radiation. He only received 2 doses of chemo due to severe mucositis, nausea and weight loss.   04/05/2007 - 08/04/2007 Chemotherapy   He received 4 more courses of cisplatin with 5FU and had complete response   07/05/2009 Procedure   Fine-needle aspirate of the right level II lymph nodes come from recurrent metastatic disease. Repeat endoscopy and CT scan show no evidence of disease elsewhere.   07/08/2009 - 12/02/2009 Chemotherapy   He was given 6 cycles of carboplatin, 5-FU and docetaxel   12/03/2009 Surgery   He has surgery to the residual lymph node on the right neck which showed no evidence of disease.   02/22/2012 Imaging   Repeat imaging study  showed large recurrent mass. He was referred elsewhere for further treatment.   05/03/2012 Surgery   He underwent left upper lobectomy.   04/29/2013 Imaging   PEt scan showed lesion on right level II B and lower lung was abnormal   06/03/2013 - 02/02/2014 Chemotherapy   He had 6 cycles of chemotherapy when he was found  to have recurrence of cancer and had received oxaliplatin and capecitabine   06/07/2014 Imaging   PET CT scan showed persistent disease in the right neck lymph nodes and left lung   06/29/2014 Procedure   Accession: [id number] repeat LUL biopsy confirmed metastatic cancer   07/18/2014 - 07/31/2014 Radiation Therapy   He received palliative radiation therapy to the lungs   10/10/2014 Imaging   CT scan of the chest, abdomen and pelvis show regression in the size of the lung nodule in the left upper lobe and stable pulmonary nodules   01/24/2015 Imaging   CT scan showed stable disease in neck and lung   06/19/2015 Imaging   CT scan of the neck and the chest show possible mild progression of the nodule in the right side of the neck.   06/25/2015 Imaging   PET scan confirmed disease recurrence in the neck   07/07/2015 Imaging   [facility] had [facility]   09/03/2015 - 08/26/2018 Chemotherapy   He received palliative chemo with Nivolumab   10/29/2015 Imaging   PET CT showed positive response to Rx   02/28/2016 Imaging   Ct abdomen showed abnormal thinkening in his stomach   03/03/2016 Imaging   CT: Right sternocleidomastoid muscle metastasis appears less distinct but otherwise not significantly changed in size or configuration since 06/19/2015.2. Left level 3 lymph node which was hypermetabolic by PET-CT in January 2017 appears slightly smaller   04/01/2016 Imaging   CT cervical spine showed no acute fracture or traumatic malalignment in the cervical spine   04/22/2016 Procedure   Port-a-cath placed.   06/16/2016 Imaging   Ct neck showed right sternocleidomastoid muscle metastasis is further decreased in conspicuity since May, and has mildly decreased in size since September 2016. Continued stability of sub-centimeter left cervical lymph nodes. No new or progressive metastatic disease in the neck.   06/16/2016 Imaging   CT chest showed stable masslike radiation fibrosis in the left upper  lobe. Stable subcentimeter pulmonary nodules in the bilateral lower lobes. No new or progressive metastatic disease in the chest. Nonobstructing left renal stone.   10/13/2016 Imaging   Ct neck showed unchanged right sternocleidomastoid muscle metastasis. Unchanged subcentimeter left cervical lymph nodes. No evidence of new or progressive metastatic disease in the neck.   10/13/2016 Imaging   CT chest showed tiny hypervascular foci in the liver, not definitely seen on prior imaging of 06/16/2016 and 02/28/2016. Abdomen MRI without and with contrast recommended to further evaluate as metastatic disease is a concern. 2. Stable appearance of post treatment changes left upper lung and scattered tiny bilateral pulmonary nodules.   02/11/2017 Imaging   Ct neck: Lymph node mass right posterior neck appears improved from the prior study. Small posterior lymph nodes on the left unchanged. Occluded right jugular vein unchanged.   02/11/2017 Imaging   1. Similar appearance of postsurgical and radiation changes in the left upper lobe. 2. Similar bilateral pulmonary nodules. 3. No thoracic adenopathy. 4. Subtle foci of post-contrast enhancement within the liver are suboptimally characterized on this nondedicated study. Likely similar. These could either be re-evaluated at followup or more entirely characterized  with abdominal MRI. 5. Left nephrolithiasis.   05/19/2017 Imaging   Matted lymph node mass right posterior neck appears larger in the recent CT. Accurate measurements difficult due to infiltrating tumor margins and infiltration of the muscle. Right jugular vein again appears occluded or resected. Small left posterior lymph nodes stable. Left upper lobe airspace density stable and similar to the prior CT   06/03/2017 PET scan   1. Hypermetabolic ill-defined right level IIb lymph node, about 1.3 cm in diameter with maximum SUV 9.5 (formerly 8.1). Appearance suspicious for residual/recurrent malignancy. No  worrisome left-sided lesion. 2. Left suprahilar indistinct opacity demonstrates no worrisome hypermetabolic activity. The 5 mm left lower lobe pulmonary nodule is stable and not currently hypermetabolic although below sensitive PET-CT size thresholds. 3. Other imaging findings of potential clinical significance: Bilateral nonobstructive nephrolithiasis. Chronic bilateral maxillary sinusitis.   05/11/2018 PET scan   1. Continued chronic accentuated metabolic activity in the vicinity of right level IIB and the adjacent right sternocleidomastoid muscle, with ill definition of surrounding tissue planes. Maximum SUV is currently 8.1, formerly 9.5. Accentuated metabolic activity is been present in this vicinity back through 06/25/2015, and there was also some low-level activity in this vicinity on 06/07/2014. Some of this may be from scarring and local muscular activity although clearly a component of residual tumor is difficult to exclude given the focally high activity. 2. Other imaging findings of potential clinical significance: Chronic bilateral maxillary sinusitis. Chronic scarring in the left upper lobe. Chronically stable 5 mm left lower lobe nodule is considered benign. Nonobstructive left nephrolithiasis.   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 COMMENT:No significant keratinization is identified. Other basaloid carcinomas are in the differential diagnosis. No cell block material is available for further testing.   09/12/2018 Procedure   He underwent fine Needle Aspiration   10/04/2018 PET scan   1. Significant progression of local recurrence laterally in the mid right neck with an enlarging, increasingly hypermetabolic soft tissue mass. This involves the right sternocleidomastoid muscle. 2. Small lymph nodes in the right axilla are increasingly  hypermetabolic. These are nonspecific and potentially reactive, although could reflect a small metastases. Small hypermetabolic nodule in the left suprasternal notch is unchanged. 3. No other evidence of metastatic disease.    10/07/2018 - 12/23/2018 Chemotherapy   The patient had cisplatin plus gemzar   12/07/2018 Imaging   1. Decreased size of lateral right neck mass. 2. Unchanged soft tissue nodule in the suprasternal notch. 3. No evidence of new metastatic disease in the neck.    05/30/2019 Imaging   CT neck No clear change or progression compared to the study of March. Overall measurements of the right lateral neck mass are similar, approximately 3 x 1.8 cm. See above discussion. One could argue that there is slight increase in lateral bulging, possibly with an increase in contrast enhancement, towards the inferior margin. This is of questionable validity but could possibly represent some progression or inflammatory change. Other findings in the region are stable.   07/20/2019 - 09/28/2019 Chemotherapy   The patient had palonosetron (ALOXI) injection 0.25 mg, 0.25 mg, Intravenous,  Once, 3 of 4 cycles Administration: 0.25 mg (07/20/2019), 0.25 mg (08/10/2019), 0.25 mg (09/08/2019) CISplatin (PLATINOL) 84 mg in sodium chloride 0.9 % 250 mL chemo infusion, 40 mg/m2 = 84 mg (80 % of original dose 50 mg/m2), Intravenous,  Once, 3 of 4 cycles  Dose modification: 40 mg/m2 (80 % of original dose 50 mg/m2, Cycle 1, Reason: Dose Not Tolerated) Administration: 84 mg (07/20/2019), 84 mg (08/10/2019), 83 mg (09/08/2019) gemcitabine (GEMZAR) 1,600 mg in sodium chloride 0.9 % 250 mL chemo infusion, 1,672 mg (80 % of original dose 1,000 mg/m2), Intravenous,  Once, 3 of 4 cycles Dose modification: 800 mg/m2 (80 % of original dose 1,000 mg/m2, Cycle 1, Reason: Provider Judgment) Administration: 1,600 mg (07/20/2019), 1,600 mg (07/27/2019), 1,600 mg (08/10/2019), 1,600 mg (08/17/2019), 1,600 mg (09/08/2019), 1,672  mg (09/15/2019) ondansetron (ZOFRAN) 8 mg, dexamethasone (DECADRON) 10 mg in sodium chloride 0.9 % 50 mL IVPB, , Intravenous,  Once, 3 of 4 cycles Administration:  (09/15/2019) fosaprepitant (EMEND) 150 mg, dexamethasone (DECADRON) 12 mg in sodium chloride 0.9 % 145 mL IVPB, , Intravenous,  Once, 3 of 4 cycles Administration:  (07/20/2019),  (08/10/2019),  (09/08/2019)  for chemotherapy treatment.    10/02/2019 Imaging   CT neck As compared to 05/30/2019, no significant interval change in size of an ill-defined mass within the right lateral neck, again measuring 3.3 x 1.8 cm in transaxial dimensions.   Unchanged mildly enlarged left level I lymph node measuring 1.1 cm in short axis.   Unchanged node or nodule at the thoracic inlet, measuring 1.3 x 0.8 cm.   Please refer to concurrently performed chest CT for a description of findings below the level of the thoracic inlet.     10/02/2019 Imaging   CT chest 1. No new or progressive findings in the chest to suggest metastatic disease. 2. Bilateral subcentimeter solid pulmonary nodules are stable since 2018. 3. Hyperdense 1.1 cm anterior liver focus, not clearly visualized on prior studies. Suggest MRI abdomen without and with IV contrast for further characterization.   10/20/2019 - 12/29/2019 Chemotherapy   The patient had ondansetron (ZOFRAN) injection 8 mg, 8 mg (100 % of original dose 8 mg), Intravenous,  Once, 2 of 5 cycles Dose modification: 8 mg (original dose 8 mg, Cycle 2) Administration: 8 mg (11/17/2019), 8 mg (12/15/2019), 8 mg (12/29/2019) gemcitabine (GEMZAR) 2,000 mg in sodium chloride 0.9 % 250 mL chemo infusion, 2,090 mg, Intravenous,  Once, 3 of 6 cycles Administration: 2,000 mg (10/20/2019), 2,000 mg (11/03/2019), 2,000 mg (11/17/2019), 2,000 mg (12/15/2019), 2,000 mg (12/29/2019)  for chemotherapy treatment.    06/12/2020 Imaging   1. Enlarging superficial, exophytic component of the chronic right sternocleidomastoid muscle mass.  See series 6, image 55. 2. Elsewhere stable CT appearance of the Neck.   06/12/2020 Imaging   Post treatment scarring in the left hemithorax, stable. No evidence recurrent or metastatic disease   06/14/2020 -  Chemotherapy   The patient had palonosetron (ALOXI) injection 0.25 mg, 0.25 mg, Intravenous,  Once, 1 of 3 cycles Administration: 0.25 mg (06/14/2020) fosaprepitant (EMEND) 150 mg in sodium chloride 0.9 % 145 mL IVPB, 150 mg, Intravenous,  Once, 1 of 3 cycles Administration: 150 mg (06/14/2020) pembrolizumab (KEYTRUDA) 200 mg in sodium chloride 0.9 % 50 mL chemo infusion, 200 mg (100 % of original dose 200 mg), Intravenous, Once, 1 of 3 cycles Dose modification: 200 mg (original dose 200 mg, Cycle 1, Reason: Provider Judgment) Administration: 200 mg (06/14/2020) fluorouracil (ADRUCIL) 6,750 mg in sodium chloride 0.9 % 115 mL chemo infusion, 800 mg/m2/day = 6,750 mg (100 % of original dose 800 mg/m2/day), Intravenous, 4D (96 hours ), 1 of 3 cycles Dose modification: 800 mg/m2/day (original dose 800 mg/m2/day, Cycle 1, Reason: Provider Judgment) Administration: 6,750 mg (06/14/2020) CARBOplatin (PARAPLATIN) 750  mg in sodium chloride 0.9 % 250 mL chemo infusion, 750 mg (100 % of original dose 750 mg), Intravenous,  Once, 1 of 3 cycles Dose modification:   (original dose 750 mg, Cycle 1) Administration: 750 mg (06/14/2020)  for chemotherapy treatment.      REVIEW OF SYSTEMS:   Constitutional: Denies fevers, chills or abnormal weight loss Eyes: Denies blurriness of vision Ears, nose, mouth, throat, and face: Denies mucositis or sore throat Respiratory: Denies cough, dyspnea or wheezes Cardiovascular: Denies palpitation, chest discomfort or lower extremity swelling Gastrointestinal:  Denies nausea, heartburn or change in bowel habits Skin: Denies abnormal skin rashes Lymphatics: Denies new lymphadenopathy or easy bruising Neurological:Denies numbness, tingling or new weaknesses All other  systems were reviewed with the patient and are negative.  I have reviewed the past medical history, past surgical history, social history and family history with the patient and they are unchanged from previous note.  ALLERGIES:  is allergic to phenergan [promethazine hcl], heparin, and clindamycin.  MEDICATIONS:  Current Outpatient Medications  Medication Sig Dispense Refill  . atorvastatin (LIPITOR) 40 MG tablet TK 1 T PO  D    . cetirizine (ZYRTEC) 10 MG tablet TAKE 1 TABLET(10 MG) BY MOUTH DAILY 30 tablet 1  . ibuprofen (ADVIL) 600 MG tablet TK 1 T PO Q 6 H PRN    . levothyroxine (SYNTHROID) 137 MCG tablet Take 1 tablet (137 mcg total) by mouth daily before breakfast. 90 tablet 9  . lidocaine (XYLOCAINE) 2 % solution Use as directed 5 mLs in the mouth or throat every 3 (three) hours as needed for mouth pain. Swish, gargle and spit 100 mL 2  . LORazepam (ATIVAN) 1 MG tablet Take 1 tablet (1 mg total) by mouth every 8 (eight) hours as needed for anxiety. 30 tablet 0  . omeprazole (PRILOSEC) 20 MG capsule Take 1 capsule (20 mg total) by mouth daily. 30 capsule 11  . ondansetron (ZOFRAN) 8 MG tablet Take 1 tablet (8 mg total) by mouth every 8 (eight) hours as needed for nausea or vomiting. 90 tablet 1  . oxyCODONE (ROXICODONE) 15 MG immediate release tablet Take 1 tablet (15 mg total) by mouth every 4 (four) hours as needed. 90 tablet 0  . PARoxetine (PAXIL) 20 MG tablet Take 1 tablet (20 mg total) by mouth daily. 30 tablet 5  . polyethylene glycol (MIRALAX) packet Take 17 g by mouth daily. (Patient taking differently: Take 17 g by mouth daily as needed for mild constipation or moderate constipation. ) 14 each 3  . prochlorperazine (COMPAZINE) 10 MG tablet Take 1 tablet (10 mg total) by mouth every 12 (twelve) hours as needed (Nausea or vomiting). 30 tablet 1  . senna-docusate (SENOKOT-S) 8.6-50 MG tablet Take 2 tablets by mouth 3 (three) times daily. 90 tablet 0  . Vitamin D, Ergocalciferol,  (DRISDOL) 1.25 MG (50000 UT) CAPS capsule TK 1 C PO WEEKLY     No current facility-administered medications for this visit.   Facility-Administered Medications Ordered in Other Visits  Medication Dose Route Frequency Provider Last Rate Last Admin  . anticoagulant sodium citrate solution 5 mL  5 mL Intracatheter Once Alvy Bimler, Sian Joles, MD      . anticoagulant sodium citrate solution 5 mL  5 mL Intracatheter Once Alvy Bimler, Arlenis Blaydes, MD      . sodium chloride flush (NS) 0.9 % injection 10 mL  10 mL Intracatheter PRN Alvy Bimler, Rainer Mounce, MD   10 mL at 06/18/20 1323    PHYSICAL EXAMINATION:  ECOG PERFORMANCE STATUS: 1 - Symptomatic but completely ambulatory GENERAL:alert, no distress and comfortable.  He appears anxious and restless NECK: The mass on the right side of his neck is stable in size NEURO: alert & oriented x 3 with fluent speech, no focal motor/sensory deficits  LABORATORY DATA:  I have reviewed the data as listed    Component Value Date/Time   NA 136 06/17/2020 0727   NA 139 09/22/2017 0829   K 3.5 06/17/2020 0727   K 3.5 09/22/2017 0829   CL 97 (L) 06/17/2020 0727   CO2 28 06/17/2020 0727   CO2 26 09/22/2017 0829   GLUCOSE 119 (H) 06/17/2020 0727   GLUCOSE 133 09/22/2017 0829   BUN 13 06/17/2020 0727   BUN 14.1 09/22/2017 0829   CREATININE 0.91 06/17/2020 0727   CREATININE 0.93 02/06/2019 1215   CREATININE 0.9 09/22/2017 0829   CALCIUM 9.1 06/17/2020 0727   CALCIUM 9.1 09/22/2017 0829   PROT 7.4 06/17/2020 0727   PROT 6.8 09/22/2017 0829   ALBUMIN 4.4 06/17/2020 0727   ALBUMIN 4.1 09/22/2017 0829   AST 20 06/17/2020 0727   AST 31 02/06/2019 1215   AST 22 09/22/2017 0829   ALT 24 06/17/2020 0727   ALT 48 (H) 02/06/2019 1215   ALT 30 09/22/2017 0829   ALKPHOS 61 06/17/2020 0727   ALKPHOS 55 09/22/2017 0829   BILITOT 0.5 06/17/2020 0727   BILITOT 0.2 (L) 02/06/2019 1215   BILITOT 0.35 09/22/2017 0829   GFRNONAA >60 06/17/2020 0727   GFRNONAA >60 02/06/2019 1215   GFRAA >60  06/17/2020 0727   GFRAA >60 02/06/2019 1215    No results found for: SPEP, UPEP  Lab Results  Component Value Date   WBC 9.8 06/17/2020   NEUTROABS 7.6 06/17/2020   HGB 14.9 06/17/2020   HCT 44.3 06/17/2020   MCV 88.6 06/17/2020   PLT 219 06/17/2020      Chemistry      Component Value Date/Time   NA 136 06/17/2020 0727   NA 139 09/22/2017 0829   K 3.5 06/17/2020 0727   K 3.5 09/22/2017 0829   CL 97 (L) 06/17/2020 0727   CO2 28 06/17/2020 0727   CO2 26 09/22/2017 0829   BUN 13 06/17/2020 0727   BUN 14.1 09/22/2017 0829   CREATININE 0.91 06/17/2020 0727   CREATININE 0.93 02/06/2019 1215   CREATININE 0.9 09/22/2017 0829      Component Value Date/Time   CALCIUM 9.1 06/17/2020 0727   CALCIUM 9.1 09/22/2017 0829   ALKPHOS 61 06/17/2020 0727   ALKPHOS 55 09/22/2017 0829   AST 20 06/17/2020 0727   AST 31 02/06/2019 1215   AST 22 09/22/2017 0829   ALT 24 06/17/2020 0727   ALT 48 (H) 02/06/2019 1215   ALT 30 09/22/2017 0829   BILITOT 0.5 06/17/2020 0727   BILITOT 0.2 (L) 02/06/2019 1215   BILITOT 0.35 09/22/2017 1194

## 2020-06-18 NOTE — Assessment & Plan Note (Signed)
He tolerated treatment poorly with significant anxiety and akathisia after treatment I plan to reduce future premed dexamethasone He will continue supportive care He felt that overall, the pressure of the tumor on his neck has improved since he started on treatment

## 2020-06-19 ENCOUNTER — Inpatient Hospital Stay: Payer: Medicaid Other

## 2020-06-19 ENCOUNTER — Telehealth: Payer: Self-pay

## 2020-06-19 NOTE — Telephone Encounter (Signed)
He arrived late for 1015 appt for IV fluids today. Infusion unable give IV fluids  today due to being late. He apologized for being late. He took the lorazepam last night and woke up late. He finally went to sleep. Offered to see if [facility] could given fluids. He declined offer and will go home a push oral intake. He is feeling better today. Only complaint is constipation. Instructed to take miralax x 2 day and to start senokot 3 x day. Reminded of appt time tomorrow for IV fluids. He verbalized  understanding.

## 2020-06-20 ENCOUNTER — Other Ambulatory Visit: Payer: Self-pay

## 2020-06-20 ENCOUNTER — Inpatient Hospital Stay: Payer: Medicaid Other

## 2020-06-20 VITALS — BP 123/75 | HR 77 | Temp 98.8°F | Resp 18 | Wt 196.6 lb

## 2020-06-20 DIAGNOSIS — C760 Malignant neoplasm of head, face and neck: Secondary | ICD-10-CM

## 2020-06-20 DIAGNOSIS — Z95828 Presence of other vascular implants and grafts: Secondary | ICD-10-CM

## 2020-06-20 DIAGNOSIS — C7800 Secondary malignant neoplasm of unspecified lung: Secondary | ICD-10-CM

## 2020-06-20 DIAGNOSIS — Z5111 Encounter for antineoplastic chemotherapy: Secondary | ICD-10-CM | POA: Diagnosis not present

## 2020-06-20 MED ORDER — SODIUM CHLORIDE 0.9% FLUSH
10.0000 mL | Freq: Once | INTRAVENOUS | Status: AC | PRN
  Administered 2020-06-20: 10 mL
  Filled 2020-06-20: qty 10

## 2020-06-20 MED ORDER — ANTICOAGULANT SODIUM CITRATE 4% (200MG/5ML) IV SOLN: 5.0000 mL | Freq: Once | Status: DC

## 2020-06-20 MED ORDER — SODIUM CHLORIDE 0.9 % IV SOLN
Freq: Once | INTRAVENOUS | Status: AC
  Filled 2020-06-20: qty 250

## 2020-06-20 NOTE — Patient Instructions (Signed)

## 2020-06-24 ENCOUNTER — Telehealth: Payer: Self-pay

## 2020-06-24 ENCOUNTER — Ambulatory Visit: Payer: Medicaid Other

## 2020-06-24 NOTE — Telephone Encounter (Signed)
Called and given below message. He verbalized understanding. 

## 2020-06-24 NOTE — Telephone Encounter (Signed)
Based on my calculation his last prescription should last 15 days It has only been 13 days Call back again in 2 days

## 2020-06-24 NOTE — Telephone Encounter (Signed)
He called and requested a refill on Oxycodone Rx.

## 2020-06-26 ENCOUNTER — Other Ambulatory Visit: Payer: Self-pay | Admitting: Hematology and Oncology

## 2020-06-26 ENCOUNTER — Telehealth: Payer: Self-pay

## 2020-06-26 MED ORDER — OXYCODONE HCL 15 MG PO TABS
15.0000 mg | ORAL_TABLET | ORAL | 0 refills | Status: DC | PRN
Start: 2020-06-26 — End: 2020-07-09

## 2020-06-26 NOTE — Telephone Encounter (Signed)
done

## 2020-06-26 NOTE — Telephone Encounter (Signed)
He called and left a message asking for a refill on Oxycodone Rx.

## 2020-06-26 NOTE — Telephone Encounter (Signed)
Called and left a message Rx sent. Ask him to call the office for questions.

## 2020-07-04 MED FILL — Dexamethasone Sodium Phosphate Inj 100 MG/10ML: INTRAMUSCULAR | Qty: 1 | Status: AC

## 2020-07-05 ENCOUNTER — Inpatient Hospital Stay: Payer: Medicaid Other

## 2020-07-05 ENCOUNTER — Inpatient Hospital Stay: Payer: Medicaid Other | Attending: Hematology and Oncology

## 2020-07-05 ENCOUNTER — Other Ambulatory Visit: Payer: Self-pay

## 2020-07-05 ENCOUNTER — Inpatient Hospital Stay (HOSPITAL_BASED_OUTPATIENT_CLINIC_OR_DEPARTMENT_OTHER): Payer: Medicaid Other | Admitting: Hematology and Oncology

## 2020-07-05 ENCOUNTER — Telehealth: Payer: Self-pay | Admitting: Hematology and Oncology

## 2020-07-05 ENCOUNTER — Encounter: Payer: Self-pay | Admitting: Hematology and Oncology

## 2020-07-05 DIAGNOSIS — Z5112 Encounter for antineoplastic immunotherapy: Secondary | ICD-10-CM | POA: Diagnosis present

## 2020-07-05 DIAGNOSIS — C119 Malignant neoplasm of nasopharynx, unspecified: Secondary | ICD-10-CM | POA: Diagnosis present

## 2020-07-05 DIAGNOSIS — Z5111 Encounter for antineoplastic chemotherapy: Secondary | ICD-10-CM | POA: Diagnosis not present

## 2020-07-05 DIAGNOSIS — Z23 Encounter for immunization: Secondary | ICD-10-CM | POA: Diagnosis not present

## 2020-07-05 DIAGNOSIS — C760 Malignant neoplasm of head, face and neck: Secondary | ICD-10-CM | POA: Diagnosis not present

## 2020-07-05 DIAGNOSIS — Z5189 Encounter for other specified aftercare: Secondary | ICD-10-CM | POA: Diagnosis not present

## 2020-07-05 DIAGNOSIS — R112 Nausea with vomiting, unspecified: Secondary | ICD-10-CM | POA: Diagnosis not present

## 2020-07-05 DIAGNOSIS — Z7189 Other specified counseling: Secondary | ICD-10-CM

## 2020-07-05 DIAGNOSIS — C7800 Secondary malignant neoplasm of unspecified lung: Secondary | ICD-10-CM

## 2020-07-05 DIAGNOSIS — Z95828 Presence of other vascular implants and grafts: Secondary | ICD-10-CM

## 2020-07-05 DIAGNOSIS — G893 Neoplasm related pain (acute) (chronic): Secondary | ICD-10-CM

## 2020-07-05 LAB — COMPREHENSIVE METABOLIC PANEL
ALT: 24 U/L (ref 0–44)
AST: 21 U/L (ref 15–41)
Albumin: 3.9 g/dL (ref 3.5–5.0)
Alkaline Phosphatase: 66 U/L (ref 38–126)
Anion gap: 11 (ref 5–15)
BUN: 15 mg/dL (ref 6–20)
CO2: 27 mmol/L (ref 22–32)
Calcium: 9.3 mg/dL (ref 8.9–10.3)
Chloride: 100 mmol/L (ref 98–111)
Creatinine, Ser: 1.11 mg/dL (ref 0.61–1.24)
GFR calc Af Amer: >60 mL/min (ref >60)
GFR calc non Af Amer: >60 mL/min (ref >60)
Glucose, Bld: 169 mg/dL — ABNORMAL HIGH (ref 70–99)
Potassium: 3.6 mmol/L (ref 3.5–5.1)
Sodium: 138 mmol/L (ref 135–145)
Total Bilirubin: 0.4 mg/dL (ref 0.3–1.2)
Total Protein: 7 g/dL (ref 6.5–8.1)

## 2020-07-05 LAB — CBC WITH DIFFERENTIAL/PLATELET
Abs Immature Granulocytes: 0.01 10*3/uL (ref 0.00–0.07)
Basophils Absolute: 0 10*3/uL (ref 0.0–0.1)
Basophils Relative: 0 %
Eosinophils Absolute: 0.1 10*3/uL (ref 0.0–0.5)
Eosinophils Relative: 2 %
HCT: 40.2 % (ref 39.0–52.0)
Hemoglobin: 13.5 g/dL (ref 13.0–17.0)
Immature Granulocytes: 0 %
Lymphocytes Relative: 25 %
Lymphs Abs: 1.1 10*3/uL (ref 0.7–4.0)
MCH: 29.9 pg (ref 26.0–34.0)
MCHC: 33.6 g/dL (ref 30.0–36.0)
MCV: 89.1 fL (ref 80.0–100.0)
Monocytes Absolute: 0.5 10*3/uL (ref 0.1–1.0)
Monocytes Relative: 10 %
Neutro Abs: 2.8 10*3/uL (ref 1.7–7.7)
Neutrophils Relative %: 63 %
Platelets: 167 10*3/uL (ref 150–400)
RBC: 4.51 MIL/uL (ref 4.22–5.81)
RDW: 15.5 % (ref 11.5–15.5)
WBC: 4.5 10*3/uL (ref 4.0–10.5)
nRBC: 0 % (ref 0.0–0.2)

## 2020-07-05 MED ORDER — SODIUM CHLORIDE 0.9% FLUSH
10.0000 mL | Freq: Once | INTRAVENOUS | Status: AC | PRN
  Administered 2020-07-05: 10 mL
  Filled 2020-07-05: qty 10

## 2020-07-05 MED ORDER — PALONOSETRON HCL INJECTION 0.25 MG/5ML
0.2500 mg | Freq: Once | INTRAVENOUS | Status: AC
  Administered 2020-07-05: 0.25 mg via INTRAVENOUS

## 2020-07-05 MED ORDER — SODIUM CHLORIDE 0.9 % IV SOLN
729.0000 mg | Freq: Once | INTRAVENOUS | Status: AC
  Administered 2020-07-05: 730 mg via INTRAVENOUS
  Filled 2020-07-05: qty 73

## 2020-07-05 MED ORDER — SODIUM CHLORIDE 0.9 % IV SOLN
4.0000 mg | Freq: Once | INTRAVENOUS | Status: AC
  Administered 2020-07-05: 4 mg via INTRAVENOUS
  Filled 2020-07-05: qty 0.4

## 2020-07-05 MED ORDER — LORAZEPAM 1 MG PO TABS: 1.0000 mg | ORAL_TABLET | Freq: Three times a day (TID) | ORAL | 0 refills | Status: DC | PRN

## 2020-07-05 MED ORDER — SODIUM CHLORIDE 0.9 % IV SOLN
200.0000 mg | Freq: Once | INTRAVENOUS | Status: AC
  Administered 2020-07-05: 200 mg via INTRAVENOUS
  Filled 2020-07-05: qty 8

## 2020-07-05 MED ORDER — SODIUM CHLORIDE 0.9 % IV SOLN: 4.0000 mg | Freq: Once | INTRAVENOUS | Status: DC

## 2020-07-05 MED ORDER — SODIUM CHLORIDE 0.9 % IV SOLN
150.0000 mg | Freq: Once | INTRAVENOUS | Status: AC
  Administered 2020-07-05: 150 mg via INTRAVENOUS
  Filled 2020-07-05: qty 150

## 2020-07-05 MED ORDER — SODIUM CHLORIDE 0.9 % IV SOLN
Freq: Once | INTRAVENOUS | Status: AC
  Filled 2020-07-05: qty 250

## 2020-07-05 MED ORDER — PALONOSETRON HCL INJECTION 0.25 MG/5ML
INTRAVENOUS | Status: AC
  Filled 2020-07-05: qty 5

## 2020-07-05 MED ORDER — SODIUM CHLORIDE 0.9 % IV SOLN
800.0000 mg/m2/d | INTRAVENOUS | Status: DC
  Administered 2020-07-05: 6750 mg via INTRAVENOUS
  Filled 2020-07-05: qty 135

## 2020-07-05 MED ORDER — DEXAMETHASONE SODIUM PHOSPHATE 10 MG/ML IJ SOLN: 4.0000 mg | Freq: Once | INTRAMUSCULAR | Status: DC

## 2020-07-05 MED FILL — Dexamethasone Sodium Phosphate Inj 100 MG/10ML: INTRAMUSCULAR | Qty: 1 | Status: AC

## 2020-07-05 NOTE — Progress Notes (Signed)
[facility] OFF[facility] PROGRESS NOTE  Patient Care Team: Patient, No Pcp Per as PCP - General (General Practice) [name], MD as Consulting Physician (Hematology and Oncology) Eppie Gibson, MD as Attending Physician (Radiation Oncology) Jodi Marble, MD as Consulting Physician (Otolaryngology) Philomena Doheny, MD as Referring Physician (Plastic Surgery) Irene Shipper, MD as Consulting Physician (Gastroenterology) System, Provider Not In  ASSESSMENT & PLAN:  Primary cancer of head and neck [facility]) He has recovered almost completely from his recent episode of anxiety and nausea We will proceed with treatment as scheduled Objectively, the size of the lesion on the right side of his neck has reduced in size I recommend minimum 3 cycles of therapy before repeating CT imaging  Cancer associated pain The dose of oxycodone was recently increased to 15 mg to use as needed He can continue the same dose and to take additional ibuprofen as needed  Nausea and vomiting His nausea is improved The patient did not tolerate Compazine He will continue antiemetics with IV fluid support I also refill prescription lorazepam as needed for him to take if he have severe nausea   No orders of the defined types were placed in this encounter.   All questions were answered. The patient knows to call the clinic with any problems, questions or concerns. The total time spent in the appointment was 20 minutes encounter with patients including review of chart and various tests results, discussions about plan of care and coordination of care plan   [name], MD 07/05/2020 10:15 AM  INTERVAL HISTORY: Please see below for problem oriented charting. 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 Pan-endoscopy elsewhere showed cancer from nasopharyngeal space.   01/04/2007 - 02/20/2007 Chemotherapy   He received 2 cycles of cisplatin and 5FU followed by concurrent chemo with weekly cisplatin and radiation. He only received 2 doses of chemo due to severe mucositis, nausea and weight loss.   04/05/2007 - 08/04/2007 Chemotherapy   He received 4 more courses of cisplatin with 5FU and had complete response   07/05/2009 Procedure   Fine-needle aspirate of the right level II lymph nodes come from recurrent metastatic disease. Repeat endoscopy and CT scan show no evidence of disease elsewhere.   07/08/2009 - 12/02/2009 Chemotherapy   He was given 6 cycles of carboplatin, 5-FU and docetaxel   12/03/2009 Surgery   He has surgery to the residual lymph node on the right neck which showed no evidence of disease.   02/22/2012 Imaging   Repeat imaging study showed large recurrent mass. He was referred elsewhere for further treatment.   05/03/2012 Surgery   He underwent left upper lobectomy.   04/29/2013 Imaging   PEt scan showed lesion on right level II B and lower lung was abnormal  06/03/2013 - 02/02/2014 Chemotherapy   He had 6 cycles of chemotherapy when he was found to have recurrence of cancer and had received oxaliplatin and capecitabine   06/07/2014 Imaging   PET CT scan showed persistent disease in the right neck lymph nodes and left lung   06/29/2014 Procedure   Accession: [id number] repeat LUL biopsy confirmed metastatic  cancer   07/18/2014 - 07/31/2014 Radiation Therapy   He received palliative radiation therapy to the lungs   10/10/2014 Imaging   CT scan of the chest, abdomen and pelvis show regression in the size of the lung nodule in the left upper lobe and stable pulmonary nodules   01/24/2015 Imaging   CT scan showed stable disease in neck and lung   06/19/2015 Imaging   CT scan of the neck and the chest show possible mild progression of the nodule in the right side of the neck.   06/25/2015 Imaging   PET scan confirmed disease recurrence in the neck   07/07/2015 Imaging   He had MRI neck at [facility]   09/03/2015 - 08/26/2018 Chemotherapy   He received palliative chemo with Nivolumab   10/29/2015 Imaging   PET CT showed positive response to Rx   02/28/2016 Imaging   Ct abdomen showed abnormal thinkening in his stomach   03/03/2016 Imaging   CT: Right sternocleidomastoid muscle metastasis appears less distinct but otherwise not significantly changed in size or configuration since 06/19/2015.2. Left level 3 lymph node which was hypermetabolic by PET-CT in January 2017 appears slightly smaller   04/01/2016 Imaging   CT cervical spine showed no acute fracture or traumatic malalignment in the cervical spine   04/22/2016 Procedure   Port-a-cath placed.   06/16/2016 Imaging   Ct neck showed right sternocleidomastoid muscle metastasis is further decreased in conspicuity since May, and has mildly decreased in size since September 2016. Continued stability of sub-centimeter left cervical lymph nodes. No new or progressive metastatic disease in the neck.   06/16/2016 Imaging   CT chest showed stable masslike radiation fibrosis in the left upper lobe. Stable subcentimeter pulmonary nodules in the bilateral lower lobes. No new or progressive metastatic disease in the chest. Nonobstructing left renal stone.   10/13/2016 Imaging   Ct neck showed unchanged right sternocleidomastoid muscle metastasis. Unchanged  subcentimeter left cervical lymph nodes. No evidence of new or progressive metastatic disease in the neck.   10/13/2016 Imaging   CT chest showed tiny hypervascular foci in the liver, not definitely seen on prior imaging of 06/16/2016 and 02/28/2016. Abdomen MRI without and with contrast recommended to further evaluate as metastatic disease is a concern. 2. Stable appearance of post treatment changes left upper lung and scattered tiny bilateral pulmonary nodules.   02/11/2017 Imaging   Ct neck: Lymph node mass right posterior neck appears improved from the prior study. Small posterior lymph nodes on the left unchanged. Occluded right jugular vein unchanged.   02/11/2017 Imaging   1. Similar appearance of postsurgical and radiation changes in the left upper lobe. 2. Similar bilateral pulmonary nodules. 3. No thoracic adenopathy. 4. Subtle foci of post-contrast enhancement within the liver are suboptimally characterized on this nondedicated study. Likely similar. These could either be re-evaluated at followup or more entirely characterized with abdominal MRI. 5. Left nephrolithiasis.   05/19/2017 Imaging   Matted lymph node mass right posterior neck appears larger in the recent CT. Accurate measurements difficult due to infiltrating tumor margins and infiltration of the muscle. Right jugular vein again appears occluded  or resected. Small left posterior lymph nodes stable. Left upper lobe airspace density stable and similar to the prior CT   06/03/2017 PET scan   1. Hypermetabolic ill-defined right level IIb lymph node, about 1.3 cm in diameter with maximum SUV 9.5 (formerly 8.1). Appearance suspicious for residual/recurrent malignancy. No worrisome left-sided lesion. 2. Left suprahilar indistinct opacity demonstrates no worrisome hypermetabolic activity. The 5 mm left lower lobe pulmonary nodule is stable and not currently hypermetabolic although below sensitive PET-CT size thresholds. 3. Other  imaging findings of potential clinical significance: Bilateral nonobstructive nephrolithiasis. Chronic bilateral maxillary sinusitis.   05/11/2018 PET scan   1. Continued chronic accentuated metabolic activity in the vicinity of right level IIB and the adjacent right sternocleidomastoid muscle, with ill definition of surrounding tissue planes. Maximum SUV is currently 8.1, formerly 9.5. Accentuated metabolic activity is been present in this vicinity back through 06/25/2015, and there was also some low-level activity in this vicinity on 06/07/2014. Some of this may be from scarring and local muscular activity although clearly a component of residual tumor is difficult to exclude given the focally high activity. 2. Other imaging findings of potential clinical significance: Chronic bilateral maxillary sinusitis. Chronic scarring in the left upper lobe. Chronically stable 5 mm left lower lobe nodule is considered benign. Nonobstructive left nephrolithiasis.   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 COMMENT:No significant keratinization is identified. Other basaloid carcinomas are in the differential diagnosis. No cell block material is available for further testing.   09/12/2018 Procedure   He underwent fine Needle Aspiration   10/04/2018 PET scan   1. Significant progression of local recurrence laterally in the mid right neck with an enlarging, increasingly hypermetabolic soft tissue mass. This involves the right sternocleidomastoid muscle. 2. Small lymph nodes in the right axilla are increasingly hypermetabolic. These are nonspecific and potentially reactive, although could reflect a small metastases. Small hypermetabolic nodule in the left suprasternal notch is unchanged. 3. No other evidence of metastatic disease.    10/07/2018 - 12/23/2018  Chemotherapy   The patient had cisplatin plus gemzar   12/07/2018 Imaging   1. Decreased size of lateral right neck mass. 2. Unchanged soft tissue nodule in the suprasternal notch. 3. No evidence of new metastatic disease in the neck.    05/30/2019 Imaging   CT neck No clear change or progression compared to the study of March. Overall measurements of the right lateral neck mass are similar, approximately 3 x 1.8 cm. See above discussion. One could argue that there is slight increase in lateral bulging, possibly with an increase in contrast enhancement, towards the inferior margin. This is of questionable validity but could possibly represent some progression or inflammatory change. Other findings in the region are stable.   07/20/2019 - 09/15/2019 Chemotherapy   The patient had dexamethasone (DECADRON) 4 MG tablet, 1 of 1 cycle, Start date: --, End date: -- palonosetron (ALOXI) injection 0.25 mg, 0.25 mg, Intravenous,  Once, 3 of 4 cycles Administration: 0.25 mg (07/20/2019), 0.25 mg (08/10/2019), 0.25 mg (09/08/2019) CISplatin (PLATINOL) 84 mg in sodium chloride 0.9 % 250 mL chemo infusion, 40 mg/m2 = 84 mg (80 % of original dose 50 mg/m2), Intravenous,  Once, 3 of 4 cycles Dose modification: 40 mg/m2 (80 % of original dose 50 mg/m2, Cycle 1, Reason: Dose Not Tolerated) Administration: 84 mg (07/20/2019), 84 mg (08/10/2019), 83 mg (09/08/2019) gemcitabine (GEMZAR)  1,600 mg in sodium chloride 0.9 % 250 mL chemo infusion, 1,672 mg (80 % of original dose 1,000 mg/m2), Intravenous,  Once, 3 of 4 cycles Dose modification: 800 mg/m2 (80 % of original dose 1,000 mg/m2, Cycle 1, Reason: Provider Judgment) Administration: 1,600 mg (07/20/2019), 1,600 mg (07/27/2019), 1,600 mg (08/10/2019), 1,600 mg (08/17/2019), 1,600 mg (09/08/2019), 1,672 mg (09/15/2019) ondansetron (ZOFRAN) 8 mg, dexamethasone (DECADRON) 10 mg in sodium chloride 0.9 % 50 mL IVPB, , Intravenous,  Once, 3 of 4 cycles Administration:   (09/15/2019) fosaprepitant (EMEND) 150 mg, dexamethasone (DECADRON) 12 mg in sodium chloride 0.9 % 145 mL IVPB, , Intravenous,  Once, 3 of 4 cycles Administration:  (07/20/2019),  (08/10/2019),  (09/08/2019)  for chemotherapy treatment.    10/02/2019 Imaging   CT neck As compared to 05/30/2019, no significant interval change in size of an ill-defined mass within the right lateral neck, again measuring 3.3 x 1.8 cm in transaxial dimensions.   Unchanged mildly enlarged left level I lymph node measuring 1.1 cm in short axis.   Unchanged node or nodule at the thoracic inlet, measuring 1.3 x 0.8 cm.   Please refer to concurrently performed chest CT for a description of findings below the level of the thoracic inlet.     10/02/2019 Imaging   CT chest 1. No new or progressive findings in the chest to suggest metastatic disease. 2. Bilateral subcentimeter solid pulmonary nodules are stable since 2018. 3. Hyperdense 1.1 cm anterior liver focus, not clearly visualized on prior studies. Suggest MRI abdomen without and with IV contrast for further characterization.   10/20/2019 - 12/29/2019 Chemotherapy   The patient had ondansetron (ZOFRAN) injection 8 mg, 8 mg (100 % of original dose 8 mg), Intravenous,  Once, 2 of 5 cycles Dose modification: 8 mg (original dose 8 mg, Cycle 2) Administration: 8 mg (11/17/2019), 8 mg (12/15/2019), 8 mg (12/29/2019) gemcitabine (GEMZAR) 2,000 mg in sodium chloride 0.9 % 250 mL chemo infusion, 2,090 mg, Intravenous,  Once, 3 of 6 cycles Administration: 2,000 mg (10/20/2019), 2,000 mg (11/03/2019), 2,000 mg (11/17/2019), 2,000 mg (12/15/2019), 2,000 mg (12/29/2019)  for chemotherapy treatment.    06/12/2020 Imaging   1. Enlarging superficial, exophytic component of the chronic right sternocleidomastoid muscle mass. See series 6, image 55. 2. Elsewhere stable CT appearance of the Neck.   06/12/2020 Imaging   Post treatment scarring in the left hemithorax, stable. No evidence  recurrent or metastatic disease   06/14/2020 -  Chemotherapy   The patient had palonosetron (ALOXI) injection 0.25 mg, 0.25 mg, Intravenous,  Once, 1 of 3 cycles Administration: 0.25 mg (06/14/2020) fosaprepitant (EMEND) 150 mg in sodium chloride 0.9 % 145 mL IVPB, 150 mg, Intravenous,  Once, 1 of 3 cycles Administration: 150 mg (06/14/2020) pembrolizumab (KEYTRUDA) 200 mg in sodium chloride 0.9 % 50 mL chemo infusion, 200 mg (100 % of original dose 200 mg), Intravenous, Once, 1 of 3 cycles Dose modification: 200 mg (original dose 200 mg, Cycle 1, Reason: Provider Judgment) Administration: 200 mg (06/14/2020) fluorouracil (ADRUCIL) 6,750 mg in sodium chloride 0.9 % 115 mL chemo infusion, 800 mg/m2/day = 6,750 mg (100 % of original dose 800 mg/m2/day), Intravenous, 4D (96 hours ), 1 of 3 cycles Dose modification: 800 mg/m2/day (original dose 800 mg/m2/day, Cycle 1, Reason: Provider Judgment) Administration: 6,750 mg (06/14/2020) CARBOplatin (PARAPLATIN) 750 mg in sodium chloride 0.9 % 250 mL chemo infusion, 750 mg (100 % of original dose 750 mg), Intravenous,  Once, 1 of 3 cycles Dose modification:   (  original dose 750 mg, Cycle 1) Administration: 750 mg (06/14/2020)  for chemotherapy treatment.      REVIEW OF SYSTEMS:   Constitutional: Denies fevers, chills or abnormal weight loss Eyes: Denies blurriness of vision Ears, nose, mouth, throat, and face: Denies mucositis or sore throat Respiratory: Denies cough, dyspnea or wheezes Cardiovascular: Denies palpitation, chest discomfort or lower extremity swelling Lymphatics: Denies new lymphadenopathy or easy bruising Neurological:Denies numbness, tingling or new weaknesses Behavioral/Psych: Mood is stable, no new changes  All other systems were reviewed with the patient and are negative.  I have reviewed the past medical history, past surgical history, social history and family history with the patient and they are unchanged from previous  note.  ALLERGIES:  is allergic to phenergan [promethazine hcl], heparin, and clindamycin.  MEDICATIONS:  Current Outpatient Medications  Medication Sig Dispense Refill  . atorvastatin (LIPITOR) 40 MG tablet TK 1 T PO  D    . cetirizine (ZYRTEC) 10 MG tablet TAKE 1 TABLET(10 MG) BY MOUTH DAILY 30 tablet 1  . ibuprofen (ADVIL) 600 MG tablet TK 1 T PO Q 6 H PRN    . levothyroxine (SYNTHROID) 137 MCG tablet Take 1 tablet (137 mcg total) by mouth daily before breakfast. 90 tablet 9  . lidocaine (XYLOCAINE) 2 % solution Use as directed 5 mLs in the mouth or throat every 3 (three) hours as needed for mouth pain. Swish, gargle and spit 100 mL 2  . LORazepam (ATIVAN) 1 MG tablet Take 1 tablet (1 mg total) by mouth every 8 (eight) hours as needed for anxiety. 30 tablet 0  . omeprazole (PRILOSEC) 20 MG capsule Take 1 capsule (20 mg total) by mouth daily. 30 capsule 11  . ondansetron (ZOFRAN) 8 MG tablet Take 1 tablet (8 mg total) by mouth every 8 (eight) hours as needed for nausea or vomiting. 90 tablet 1  . oxyCODONE (ROXICODONE) 15 MG immediate release tablet Take 1 tablet (15 mg total) by mouth every 4 (four) hours as needed. 90 tablet 0  . PARoxetine (PAXIL) 20 MG tablet Take 1 tablet (20 mg total) by mouth daily. 30 tablet 5  . polyethylene glycol (MIRALAX) packet Take 17 g by mouth daily. (Patient taking differently: Take 17 g by mouth daily as needed for mild constipation or moderate constipation. ) 14 each 3  . senna-docusate (SENOKOT-S) 8.6-50 MG tablet Take 2 tablets by mouth 3 (three) times daily. 90 tablet 0  . Vitamin D, Ergocalciferol, (DRISDOL) 1.25 MG (50000 UT) CAPS capsule TK 1 C PO WEEKLY     No current facility-administered medications for this visit.   Facility-Administered Medications Ordered in Other Visits  Medication Dose Route Frequency Provider Last Rate Last Admin  . anticoagulant sodium citrate solution 5 mL  5 mL Intracatheter Once Alvy Bimler, Samit Sylve, MD      . anticoagulant  sodium citrate solution 5 mL  5 mL Intracatheter Once Alvy Bimler, Hays Dunnigan, MD        PHYSICAL EXAMINATION: ECOG PERFORMANCE STATUS: 1 - Symptomatic but completely ambulatory  Vitals:   07/05/20 0919  BP: (!) 126/94  Pulse: 85  Resp: 18  Temp: 97.7 F (36.5 C)  SpO2: 98%   Filed Weights   07/05/20 0919  Weight: 201 lb 14.4 oz (91.6 kg)    GENERAL:alert, no distress and comfortable SKIN: skin color, texture, turgor are normal, no rashes or significant lesions EYES: normal, Conjunctiva are pink and non-injected, sclera clear OROPHARYNX:no exudate, no erythema and lips, buccal mucosa, and tongue normal  NECK: The size of the lesion on the right side of his neck is stable/slightly smaller  NEURO: alert & oriented x 3 with fluent speech, no focal motor/sensory deficits  LABORATORY DATA:  I have reviewed the data as listed    Component Value Date/Time   NA 138 07/05/2020 0845   NA 139 09/22/2017 0829   K 3.6 07/05/2020 0845   K 3.5 09/22/2017 0829   CL 100 07/05/2020 0845   CO2 27 07/05/2020 0845   CO2 26 09/22/2017 0829   GLUCOSE 169 (H) 07/05/2020 0845   GLUCOSE 133 09/22/2017 0829   BUN 15 07/05/2020 0845   BUN 14.1 09/22/2017 0829   CREATININE 1.11 07/05/2020 0845   CREATININE 0.93 02/06/2019 1215   CREATININE 0.9 09/22/2017 0829   CALCIUM 9.3 07/05/2020 0845   CALCIUM 9.1 09/22/2017 0829   PROT 7.0 07/05/2020 0845   PROT 6.8 09/22/2017 0829   ALBUMIN 3.9 07/05/2020 0845   ALBUMIN 4.1 09/22/2017 0829   AST 21 07/05/2020 0845   AST 31 02/06/2019 1215   AST 22 09/22/2017 0829   ALT 24 07/05/2020 0845   ALT 48 (H) 02/06/2019 1215   ALT 30 09/22/2017 0829   ALKPHOS 66 07/05/2020 0845   ALKPHOS 55 09/22/2017 0829   BILITOT 0.4 07/05/2020 0845   BILITOT 0.2 (L) 02/06/2019 1215   BILITOT 0.35 09/22/2017 0829   GFRNONAA >60 07/05/2020 0845   GFRNONAA >60 02/06/2019 1215   GFRAA >60 07/05/2020 0845   GFRAA >60 02/06/2019 1215    No results found for: SPEP, UPEP  Lab  Results  Component Value Date   WBC 4.5 07/05/2020   NEUTROABS 2.8 07/05/2020   HGB 13.5 07/05/2020   HCT 40.2 07/05/2020   MCV 89.1 07/05/2020   PLT 167 07/05/2020      Chemistry      Component Value Date/Time   NA 138 07/05/2020 0845   NA 139 09/22/2017 0829   K 3.6 07/05/2020 0845   K 3.5 09/22/2017 0829   CL 100 07/05/2020 0845   CO2 27 07/05/2020 0845   CO2 26 09/22/2017 0829   BUN 15 07/05/2020 0845   BUN 14.1 09/22/2017 0829   CREATININE 1.11 07/05/2020 0845   CREATININE 0.93 02/06/2019 1215   CREATININE 0.9 09/22/2017 0829      Component Value Date/Time   CALCIUM 9.3 07/05/2020 0845   CALCIUM 9.1 09/22/2017 0829   ALKPHOS 66 07/05/2020 0845   ALKPHOS 55 09/22/2017 0829   AST 21 07/05/2020 0845   AST 31 02/06/2019 1215   AST 22 09/22/2017 0829   ALT 24 07/05/2020 0845   ALT 48 (H) 02/06/2019 1215   ALT 30 09/22/2017 0829   BILITOT 0.4 07/05/2020 0845   BILITOT 0.2 (L) 02/06/2019 1215   BILITOT 0.35 09/22/2017 8546

## 2020-07-05 NOTE — Assessment & Plan Note (Signed)
The dose of oxycodone was recently increased to 15 mg to use as needed He can continue the same dose and to take additional ibuprofen as needed

## 2020-07-05 NOTE — Assessment & Plan Note (Addendum)
His nausea is improved The patient did not tolerate Compazine He will continue antiemetics with IV fluid support I also refill prescription lorazepam as needed for him to take if he have severe nausea

## 2020-07-05 NOTE — Assessment & Plan Note (Signed)
He has recovered almost completely from his recent episode of anxiety and nausea We will proceed with treatment as scheduled Objectively, the size of the lesion on the right side of his neck has reduced in size I recommend minimum 3 cycles of therapy before repeating CT imaging

## 2020-07-05 NOTE — Telephone Encounter (Signed)
Scheduled appointments per 10/1 scheduling message. Gave patient updated calendar.

## 2020-07-05 NOTE — Patient Instructions (Signed)
[facility] Discharge Instructions for Patients Receiving Chemotherapy  Today you received the following chemotherapy agents: pembrolizumab (keytruda)/carboplatin/fluorouracil (5FU).  To help prevent nausea and vomiting after your treatment, we encourage you to take your nausea medication as directed.   If you develop nausea and vomiting that is not controlled by your nausea medication, call the clinic.   BELOW ARE SYMPTOMS THAT SHOULD BE REPORTED IMMEDIATELY:  *FEVER GREATER THAN 100.5 F  *CHILLS WITH OR WITHOUT FEVER  NAUSEA AND VOMITING THAT IS NOT CONTROLLED WITH YOUR NAUSEA MEDICATION  *UNUSUAL SHORTNESS OF BREATH  *UNUSUAL BRUISING OR BLEEDING  TENDERNESS IN MOUTH AND THROAT WITH OR WITHOUT PRESENCE OF ULCERS  *URINARY PROBLEMS  *BOWEL PROBLEMS  UNUSUAL RASH Items with * indicate a potential emergency and should be followed up as soon as possible.  Feel free to call the clinic should you have any questions or concerns. The clinic phone number is [phone].  Please show the [facility] at check-in to the Emergency Department and triage nurse.  Pembrolizumab injection What is this medicine? PEMBROLIZUMAB (pem broe liz ue mab) is a monoclonal antibody. It is used to treat certain types of cancer. This medicine may be used for other purposes; ask your health care provider or pharmacist if you have questions. COMMON BRAND NAME(S): Keytruda What should I tell my health care provider before I take this medicine? They need to know if you have any of these conditions:  diabetes  immune system problems  inflammatory bowel disease  liver disease  lung or breathing disease  lupus  received or scheduled to receive an organ transplant or a stem-cell transplant that uses donor stem cells  an unusual or allergic reaction to pembrolizumab, other medicines, foods, dyes, or preservatives  pregnant or trying to get pregnant  breast-feeding How  should I use this medicine? This medicine is for infusion into a vein. It is given by a health care professional in a hospital or clinic setting. A special MedGuide will be given to you before each treatment. Be sure to read this information carefully each time. Talk to your pediatrician regarding the use of this medicine in children. While this drug may be prescribed for children as young as 6 months for selected conditions, precautions do apply. Overdosage: If you think you have taken too much of this medicine contact a poison control center or emergency room at once. NOTE: This medicine is only for you. Do not share this medicine with others. What if I miss a dose? It is important not to miss your dose. Call your doctor or health care professional if you are unable to keep an appointment. What may interact with this medicine? Interactions have not been studied. Give your health care provider a list of all the medicines, herbs, non-prescription drugs, or dietary supplements you use. Also tell them if you smoke, drink alcohol, or use illegal drugs. Some items may interact with your medicine. This list may not describe all possible interactions. Give your health care provider a list of all the medicines, herbs, non-prescription drugs, or dietary supplements you use. Also tell them if you smoke, drink alcohol, or use illegal drugs. Some items may interact with your medicine. What should I watch for while using this medicine? Your condition will be monitored carefully while you are receiving this medicine. You may need blood work done while you are taking this medicine. Do not become pregnant while taking this medicine or for 4 months after stopping it.  Women should inform their doctor if they wish to become pregnant or think they might be pregnant. There is a potential for serious side effects to an unborn child. Talk to your health care professional or pharmacist for more information. Do not  breast-feed an infant while taking this medicine or for 4 months after the last dose. What side effects may I notice from receiving this medicine? Side effects that you should report to your doctor or health care professional as soon as possible:  allergic reactions like skin rash, itching or hives, swelling of the face, lips, or tongue  bloody or black, tarry  breathing problems  changes in vision  chest pain  chills  confusion  constipation  cough  diarrhea  dizziness or feeling faint or lightheaded  fast or irregular heartbeat  fever  flushing  joint pain  low blood counts - this medicine may decrease the number of white blood cells, red blood cells and platelets. You may be at increased risk for infections and bleeding.  muscle pain  muscle weakness  pain, tingling, numbness in the hands or feet  persistent headache  redness, blistering, peeling or loosening of the skin, including inside the mouth  signs and symptoms of high blood sugar such as dizziness; dry mouth; dry skin; fruity breath; nausea; stomach pain; increased hunger or thirst; increased urination  signs and symptoms of kidney injury like trouble passing urine or change in the amount of urine  signs and symptoms of liver injury like dark urine, light-colored stools, loss of appetite, nausea, right upper belly pain, yellowing of the eyes or skin  sweating  swollen lymph nodes  weight loss Side effects that usually do not require medical attention (report to your doctor or health care professional if they continue or are bothersome):  decreased appetite  hair loss  muscle pain  tiredness This list may not describe all possible side effects. Call your doctor for medical advice about side effects. You may report side effects to FDA at 1-800-FDA-1088. Where should I keep my medicine? This drug is given in a hospital or clinic and will not be stored at home. NOTE: This sheet is a summary.  It may not cover all possible information. If you have questions about this medicine, talk to your doctor, pharmacist, or health care provider.  2020 Elsevier/Gold Standard (2019-07-28 18:07:58)  Carboplatin injection What is this medicine? CARBOPLATIN (KAR boe pla tin) is a chemotherapy drug. It targets fast dividing cells, like cancer cells, and causes these cells to die. This medicine is used to treat ovarian cancer and many other cancers. This medicine may be used for other purposes; ask your health care provider or pharmacist if you have questions. COMMON BRAND NAME(S): Paraplatin What should I tell my health care provider before I take this medicine? They need to know if you have any of these conditions:  blood disorders  hearing problems  kidney disease  recent or ongoing radiation therapy  an unusual or allergic reaction to carboplatin, cisplatin, other chemotherapy, other medicines, foods, dyes, or preservatives  pregnant or trying to get pregnant  breast-feeding How should I use this medicine? This drug is usually given as an infusion into a vein. It is administered in a hospital or clinic by a specially trained health care professional. Talk to your pediatrician regarding the use of this medicine in children. Special care may be needed. Overdosage: If you think you have taken too much of this medicine contact a poison control center  or emergency room at once. NOTE: This medicine is only for you. Do not share this medicine with others. What if I miss a dose? It is important not to miss a dose. Call your doctor or health care professional if you are unable to keep an appointment. What may interact with this medicine?  medicines for seizures  medicines to increase blood counts like filgrastim, pegfilgrastim, sargramostim  some antibiotics like amikacin, gentamicin, neomycin, streptomycin, tobramycin  vaccines Talk to your doctor or health care professional before  taking any of these medicines:  acetaminophen  aspirin  ibuprofen  ketoprofen  naproxen This list may not describe all possible interactions. Give your health care provider a list of all the medicines, herbs, non-prescription drugs, or dietary supplements you use. Also tell them if you smoke, drink alcohol, or use illegal drugs. Some items may interact with your medicine. What should I watch for while using this medicine? Your condition will be monitored carefully while you are receiving this medicine. You will need important blood work done while you are taking this medicine. This drug may make you feel generally unwell. This is not uncommon, as chemotherapy can affect healthy cells as well as cancer cells. Report any side effects. Continue your course of treatment even though you feel ill unless your doctor tells you to stop. In some cases, you may be given additional medicines to help with side effects. Follow all directions for their use. Call your doctor or health care professional for advice if you get a fever, chills or sore throat, or other symptoms of a cold or flu. Do not treat yourself. This drug decreases your body's ability to fight infections. Try to avoid being around people who are sick. This medicine may increase your risk to bruise or bleed. Call your doctor or health care professional if you notice any unusual bleeding. Be careful brushing and flossing your teeth or using a toothpick because you may get an infection or bleed more easily. If you have any dental work done, tell your dentist you are receiving this medicine. Avoid taking products that contain aspirin, acetaminophen, ibuprofen, naproxen, or ketoprofen unless instructed by your doctor. These medicines may hide a fever. Do not become pregnant while taking this medicine. Women should inform their doctor if they wish to become pregnant or think they might be pregnant. There is a potential for serious side effects to an  unborn child. Talk to your health care professional or pharmacist for more information. Do not breast-feed an infant while taking this medicine. What side effects may I notice from receiving this medicine? Side effects that you should report to your doctor or health care professional as soon as possible:  allergic reactions like skin rash, itching or hives, swelling of the face, lips, or tongue  signs of infection - fever or chills, cough, sore throat, pain or difficulty passing urine  signs of decreased platelets or bleeding - bruising, pinpoint red spots on the skin, black, tarry stools, nosebleeds  signs of decreased red blood cells - unusually weak or tired, fainting spells, lightheadedness  breathing problems  changes in hearing  changes in vision  chest pain  high blood pressure  low blood counts - This drug may decrease the number of white blood cells, red blood cells and platelets. You may be at increased risk for infections and bleeding.  nausea and vomiting  pain, swelling, redness or irritation at the injection site  pain, tingling, numbness in the hands or feet  problems with balance, talking, walking  trouble passing urine or change in the amount of urine Side effects that usually do not require medical attention (report to your doctor or health care professional if they continue or are bothersome):  hair loss  loss of appetite  metallic taste in the mouth or changes in taste This list may not describe all possible side effects. Call your doctor for medical advice about side effects. You may report side effects to FDA at 1-800-FDA-1088. Where should I keep my medicine? This drug is given in a hospital or clinic and will not be stored at home. NOTE: This sheet is a summary. It may not cover all possible information. If you have questions about this medicine, talk to your doctor, pharmacist, or health care provider.  2020 Elsevier/Gold Standard (2007-12-27  14:38:05)  Fluorouracil, 5-FU injection What is this medicine? FLUOROURACIL, 5-FU (flure oh YOOR a sil) is a chemotherapy drug. It slows the growth of cancer cells. This medicine is used to treat many types of cancer like breast cancer, colon or rectal cancer, pancreatic cancer, and stomach cancer. This medicine may be used for other purposes; ask your health care provider or pharmacist if you have questions. COMMON BRAND NAME(S): Adrucil What should I tell my health care provider before I take this medicine? They need to know if you have any of these conditions:  blood disorders  dihydropyrimidine dehydrogenase (DPD) deficiency  infection (especially a virus infection such as chickenpox, cold sores, or herpes)  kidney disease  liver disease  malnourished, poor nutrition  recent or ongoing radiation therapy  an unusual or allergic reaction to fluorouracil, other chemotherapy, other medicines, foods, dyes, or preservatives  pregnant or trying to get pregnant  breast-feeding How should I use this medicine? This drug is given as an infusion or injection into a vein. It is administered in a hospital or clinic by a specially trained health care professional. Talk to your pediatrician regarding the use of this medicine in children. Special care may be needed. Overdosage: If you think you have taken too much of this medicine contact a poison control center or emergency room at once. NOTE: This medicine is only for you. Do not share this medicine with others. What if I miss a dose? It is important not to miss your dose. Call your doctor or health care professional if you are unable to keep an appointment. What may interact with this medicine?  allopurinol  cimetidine  dapsone  digoxin  hydroxyurea  leucovorin  levamisole  medicines for seizures like ethotoin, fosphenytoin, phenytoin  medicines to increase blood counts like filgrastim, pegfilgrastim,  sargramostim  medicines that treat or prevent blood clots like warfarin, enoxaparin, and dalteparin  methotrexate  metronidazole  pyrimethamine  some other chemotherapy drugs like busulfan, cisplatin, estramustine, vinblastine  trimethoprim  trimetrexate  vaccines Talk to your doctor or health care professional before taking any of these medicines:  acetaminophen  aspirin  ibuprofen  ketoprofen  naproxen This list may not describe all possible interactions. Give your health care provider a list of all the medicines, herbs, non-prescription drugs, or dietary supplements you use. Also tell them if you smoke, drink alcohol, or use illegal drugs. Some items may interact with your medicine. What should I watch for while using this medicine? Visit your doctor for checks on your progress. This drug may make you feel generally unwell. This is not uncommon, as chemotherapy can affect healthy cells as well as cancer cells. Report any side  effects. Continue your course of treatment even though you feel ill unless your doctor tells you to stop. In some cases, you may be given additional medicines to help with side effects. Follow all directions for their use. Call your doctor or health care professional for advice if you get a fever, chills or sore throat, or other symptoms of a cold or flu. Do not treat yourself. This drug decreases your body's ability to fight infections. Try to avoid being around people who are sick. This medicine may increase your risk to bruise or bleed. Call your doctor or health care professional if you notice any unusual bleeding. Be careful brushing and flossing your teeth or using a toothpick because you may get an infection or bleed more easily. If you have any dental work done, tell your dentist you are receiving this medicine. Avoid taking products that contain aspirin, acetaminophen, ibuprofen, naproxen, or ketoprofen unless instructed by your doctor. These  medicines may hide a fever. Do not become pregnant while taking this medicine. Women should inform their doctor if they wish to become pregnant or think they might be pregnant. There is a potential for serious side effects to an unborn child. Talk to your health care professional or pharmacist for more information. Do not breast-feed an infant while taking this medicine. Men should inform their doctor if they wish to father a child. This medicine may lower sperm counts. Do not treat diarrhea with over the counter products. Contact your doctor if you have diarrhea that lasts more than 2 days or if it is severe and watery. This medicine can make you more sensitive to the sun. Keep out of the sun. If you cannot avoid being in the sun, wear protective clothing and use sunscreen. Do not use sun lamps or tanning beds/booths. What side effects may I notice from receiving this medicine? Side effects that you should report to your doctor or health care professional as soon as possible:  allergic reactions like skin rash, itching or hives, swelling of the face, lips, or tongue  low blood counts - this medicine may decrease the number of white blood cells, red blood cells and platelets. You may be at increased risk for infections and bleeding.  signs of infection - fever or chills, cough, sore throat, pain or difficulty passing urine  signs of decreased platelets or bleeding - bruising, pinpoint red spots on the skin, black, tarry stools, blood in the urine  signs of decreased red blood cells - unusually weak or tired, fainting spells, lightheadedness  breathing problems  changes in vision  chest pain  mouth sores  nausea and vomiting  pain, swelling, redness at site where injected  pain, tingling, numbness in the hands or feet  redness, swelling, or sores on hands or feet  stomach pain  unusual bleeding Side effects that usually do not require medical attention (report to your doctor or  health care professional if they continue or are bothersome):  changes in finger or toe nails  diarrhea  dry or itchy skin  hair loss  headache  loss of appetite  sensitivity of eyes to the light  stomach upset  unusually teary eyes This list may not describe all possible side effects. Call your doctor for medical advice about side effects. You may report side effects to FDA at 1-800-FDA-1088. Where should I keep my medicine? This drug is given in a hospital or clinic and will not be stored at home. NOTE: This sheet is a summary.  It may not cover all possible information. If you have questions about this medicine, talk to your doctor, pharmacist, or health care provider.  2020 Elsevier/Gold Standard (2008-01-25 13:53:16)

## 2020-07-06 ENCOUNTER — Inpatient Hospital Stay: Payer: Medicaid Other

## 2020-07-06 VITALS — BP 122/89 | HR 75 | Temp 97.2°F | Resp 20

## 2020-07-06 DIAGNOSIS — C760 Malignant neoplasm of head, face and neck: Secondary | ICD-10-CM

## 2020-07-06 DIAGNOSIS — Z7189 Other specified counseling: Secondary | ICD-10-CM

## 2020-07-06 MED ORDER — HEPARIN SOD (PORK) LOCK FLUSH 100 UNIT/ML IV SOLN
500.0000 [IU] | Freq: Once | INTRAVENOUS | Status: DC | PRN
  Filled 2020-07-06: qty 5

## 2020-07-06 MED ORDER — SODIUM CHLORIDE 0.9% FLUSH
10.0000 mL | INTRAVENOUS | Status: DC | PRN
  Filled 2020-07-06: qty 10

## 2020-07-06 NOTE — Progress Notes (Signed)
Patient arrive at 11:20 for IVF. Pump was still administering 5FU with DC date of 10/5. This RN attempted IV x2 and Elmyra Ricks A attempted x1. After 3 unsuccessful attempts contacted Dr. Lindi Adie, on call MD, and received an order to DC patient and to return for next scheduled appt. Patient made aware of plan. Prio to DC patient refused VS and ambulated independently.

## 2020-07-08 ENCOUNTER — Inpatient Hospital Stay: Payer: Medicaid Other

## 2020-07-08 ENCOUNTER — Telehealth: Payer: Self-pay

## 2020-07-08 MED FILL — Dexamethasone Sodium Phosphate Inj 100 MG/10ML: INTRAMUSCULAR | Qty: 1 | Status: AC

## 2020-07-08 NOTE — Telephone Encounter (Signed)
He called back and is just returning call from earlier today. He is requesting appt for IV fluids for today. Told him that it is too late for today at [facility]. Instructed to go to ER or try to push oral intake. He verbalized understanding and will try to drink more fluids. Appt for pump d/c and IV fluids moved to 3 pm tomorrow, ask him to arrive at 2:30 pm.  He verbalzied understanding.

## 2020-07-08 NOTE — Telephone Encounter (Signed)
Called and left another message asking him to call the office back regarding appt tomorrow. He has x 2 infusion appt times. 0830 for IV fluids and 2:45 for pump d/c. Asking if he would like to have one appt tomorrow at 3 pm for IV fluids and pump d/c.

## 2020-07-08 NOTE — Progress Notes (Signed)
Okay to give IV fluids thru port with Adrucil infusion via CAD pump at the same time per Dr. Alvy Bimler.

## 2020-07-08 NOTE — Telephone Encounter (Signed)
Called and left a message asking him to call the office back regarding appts.

## 2020-07-09 ENCOUNTER — Inpatient Hospital Stay: Payer: Medicaid Other

## 2020-07-09 ENCOUNTER — Other Ambulatory Visit: Payer: Self-pay

## 2020-07-09 ENCOUNTER — Other Ambulatory Visit: Payer: Self-pay | Admitting: Hematology and Oncology

## 2020-07-09 VITALS — BP 134/77 | HR 69 | Temp 98.2°F | Resp 20

## 2020-07-09 DIAGNOSIS — Z95828 Presence of other vascular implants and grafts: Secondary | ICD-10-CM

## 2020-07-09 DIAGNOSIS — C760 Malignant neoplasm of head, face and neck: Secondary | ICD-10-CM

## 2020-07-09 DIAGNOSIS — Z5111 Encounter for antineoplastic chemotherapy: Secondary | ICD-10-CM | POA: Diagnosis not present

## 2020-07-09 DIAGNOSIS — C7800 Secondary malignant neoplasm of unspecified lung: Secondary | ICD-10-CM

## 2020-07-09 MED ORDER — OXYCODONE HCL 5 MG PO TABS
ORAL_TABLET | ORAL | Status: AC
  Filled 2020-07-09: qty 3

## 2020-07-09 MED ORDER — OXYCODONE HCL 5 MG PO TABS
15.0000 mg | ORAL_TABLET | Freq: Once | ORAL | Status: AC
  Administered 2020-07-09: 15 mg via ORAL

## 2020-07-09 MED ORDER — ONDANSETRON HCL 4 MG/2ML IJ SOLN
INTRAMUSCULAR | Status: AC
  Filled 2020-07-09: qty 4

## 2020-07-09 MED ORDER — ONDANSETRON HCL 4 MG/2ML IJ SOLN
8.0000 mg | Freq: Once | INTRAMUSCULAR | Status: AC
  Administered 2020-07-09: 8 mg via INTRAVENOUS

## 2020-07-09 MED ORDER — SODIUM CHLORIDE 0.9 % IV SOLN
Freq: Once | INTRAVENOUS | Status: AC
  Filled 2020-07-09: qty 250

## 2020-07-09 MED ORDER — ANTICOAGULANT SODIUM CITRATE 4% (200MG/5ML) IV SOLN
5.0000 mL | Freq: Once | Status: AC
  Administered 2020-07-09: 5 mL
  Filled 2020-07-09: qty 5

## 2020-07-09 MED ORDER — SODIUM CHLORIDE 0.9% FLUSH
10.0000 mL | Freq: Once | INTRAVENOUS | Status: AC
  Administered 2020-07-09: 10 mL
  Filled 2020-07-09: qty 10

## 2020-07-09 MED ORDER — OXYCODONE HCL 15 MG PO TABS
15.0000 mg | ORAL_TABLET | ORAL | 0 refills | Status: DC | PRN
Start: 2020-07-09 — End: 2020-07-24

## 2020-07-09 NOTE — Patient Instructions (Signed)
Pick up your pain medicine from the pharmacy!

## 2020-07-10 ENCOUNTER — Inpatient Hospital Stay: Payer: Medicaid Other

## 2020-07-23 ENCOUNTER — Telehealth: Payer: Self-pay

## 2020-07-23 NOTE — Telephone Encounter (Signed)
He called and left a message. Requesting refill on Oxycodone Rx.  Called back. He is complaining of a sore throat. Denies fever. He is having nasal discharge, muscle aches, headache, feeling weak and frequent bm's. X 4 bm's today. Symptoms started yesterday.  2 of his children are also sick. He son started having fever and vomiting last Friday. Instructed to go get COVID tested and call the office with results. Dr. Alvy Bimler will send oxycodone Rx tomorrow. He verbalized understanding.

## 2020-07-24 ENCOUNTER — Other Ambulatory Visit: Payer: Self-pay | Admitting: Hematology and Oncology

## 2020-07-24 MED ORDER — OXYCODONE HCL 15 MG PO TABS
15.0000 mg | ORAL_TABLET | ORAL | 0 refills | Status: DC | PRN
Start: 2020-07-24 — End: 2020-08-30

## 2020-07-24 NOTE — Telephone Encounter (Signed)
Called and given below message. He verbalized understanding. \ He saw his PCP this morning and had chest xray. Rapid covid test was negative. They are sending a PCR covid test that will take 1-2 days to get results. His PCP gave him a antibiotic to start today. He will call back when he gets the results.

## 2020-07-24 NOTE — Telephone Encounter (Signed)
I refilled his oxycodone I agree with Covid test

## 2020-07-25 MED FILL — Dexamethasone Sodium Phosphate Inj 100 MG/10ML: INTRAMUSCULAR | Qty: 1 | Status: AC

## 2020-07-26 ENCOUNTER — Inpatient Hospital Stay: Payer: Medicaid Other

## 2020-07-26 ENCOUNTER — Encounter: Payer: Self-pay | Admitting: Hematology and Oncology

## 2020-07-26 ENCOUNTER — Other Ambulatory Visit: Payer: Self-pay | Admitting: Hematology and Oncology

## 2020-07-26 ENCOUNTER — Inpatient Hospital Stay (HOSPITAL_BASED_OUTPATIENT_CLINIC_OR_DEPARTMENT_OTHER): Payer: Medicaid Other | Admitting: Hematology and Oncology

## 2020-07-26 ENCOUNTER — Other Ambulatory Visit: Payer: Self-pay

## 2020-07-26 DIAGNOSIS — Z95828 Presence of other vascular implants and grafts: Secondary | ICD-10-CM

## 2020-07-26 DIAGNOSIS — C760 Malignant neoplasm of head, face and neck: Secondary | ICD-10-CM

## 2020-07-26 DIAGNOSIS — D61818 Other pancytopenia: Secondary | ICD-10-CM | POA: Diagnosis not present

## 2020-07-26 DIAGNOSIS — R112 Nausea with vomiting, unspecified: Secondary | ICD-10-CM

## 2020-07-26 DIAGNOSIS — C7800 Secondary malignant neoplasm of unspecified lung: Secondary | ICD-10-CM

## 2020-07-26 DIAGNOSIS — Z7189 Other specified counseling: Secondary | ICD-10-CM

## 2020-07-26 DIAGNOSIS — G893 Neoplasm related pain (acute) (chronic): Secondary | ICD-10-CM | POA: Diagnosis not present

## 2020-07-26 DIAGNOSIS — Z23 Encounter for immunization: Secondary | ICD-10-CM

## 2020-07-26 DIAGNOSIS — Z5111 Encounter for antineoplastic chemotherapy: Secondary | ICD-10-CM | POA: Diagnosis not present

## 2020-07-26 DIAGNOSIS — K5909 Other constipation: Secondary | ICD-10-CM

## 2020-07-26 LAB — COMPREHENSIVE METABOLIC PANEL
ALT: 28 U/L (ref 0–44)
AST: 21 U/L (ref 15–41)
Albumin: 4.1 g/dL (ref 3.5–5.0)
Alkaline Phosphatase: 66 U/L (ref 38–126)
Anion gap: 10 (ref 5–15)
BUN: 13 mg/dL (ref 6–20)
CO2: 27 mmol/L (ref 22–32)
Calcium: 9.4 mg/dL (ref 8.9–10.3)
Chloride: 102 mmol/L (ref 98–111)
Creatinine, Ser: 1.09 mg/dL (ref 0.61–1.24)
GFR, Estimated: >60 mL/min (ref >60)
Glucose, Bld: 195 mg/dL — ABNORMAL HIGH (ref 70–99)
Potassium: 3.3 mmol/L — ABNORMAL LOW (ref 3.5–5.1)
Sodium: 139 mmol/L (ref 135–145)
Total Bilirubin: 0.3 mg/dL (ref 0.3–1.2)
Total Protein: 6.9 g/dL (ref 6.5–8.1)

## 2020-07-26 LAB — CBC WITH DIFFERENTIAL/PLATELET
Abs Immature Granulocytes: 0.01 10*3/uL (ref 0.00–0.07)
Basophils Absolute: 0 10*3/uL (ref 0.0–0.1)
Basophils Relative: 0 %
Eosinophils Absolute: 0.1 10*3/uL (ref 0.0–0.5)
Eosinophils Relative: 4 %
HCT: 39.2 % (ref 39.0–52.0)
Hemoglobin: 13.1 g/dL (ref 13.0–17.0)
Immature Granulocytes: 0 %
Lymphocytes Relative: 32 %
Lymphs Abs: 1.1 10*3/uL (ref 0.7–4.0)
MCH: 31.1 pg (ref 26.0–34.0)
MCHC: 33.4 g/dL (ref 30.0–36.0)
MCV: 93.1 fL (ref 80.0–100.0)
Monocytes Absolute: 0.4 10*3/uL (ref 0.1–1.0)
Monocytes Relative: 14 %
Neutro Abs: 1.6 10*3/uL — ABNORMAL LOW (ref 1.7–7.7)
Neutrophils Relative %: 50 %
Platelets: 142 10*3/uL — ABNORMAL LOW (ref 150–400)
RBC: 4.21 MIL/uL — ABNORMAL LOW (ref 4.22–5.81)
RDW: 16.4 % — ABNORMAL HIGH (ref 11.5–15.5)
WBC: 3.2 10*3/uL — ABNORMAL LOW (ref 4.0–10.5)
nRBC: 0 % (ref 0.0–0.2)

## 2020-07-26 MED ORDER — DEXAMETHASONE 4 MG PO TABS
ORAL_TABLET | ORAL | Status: AC
  Filled 2020-07-26: qty 5

## 2020-07-26 MED ORDER — PALONOSETRON HCL INJECTION 0.25 MG/5ML
INTRAVENOUS | Status: AC
  Filled 2020-07-26: qty 5

## 2020-07-26 MED ORDER — SODIUM CHLORIDE 0.9 % IV SOLN
10.0000 mg | Freq: Once | INTRAVENOUS | Status: AC
  Administered 2020-07-26: 10 mg via INTRAVENOUS
  Filled 2020-07-26: qty 10

## 2020-07-26 MED ORDER — OMEPRAZOLE 40 MG PO CPDR
40.0000 mg | DELAYED_RELEASE_CAPSULE | Freq: Every day | ORAL | 9 refills | Status: DC
Start: 2020-07-26 — End: 2021-05-09

## 2020-07-26 MED ORDER — ACETAMINOPHEN 325 MG PO TABS
ORAL_TABLET | ORAL | Status: AC
  Filled 2020-07-26: qty 2

## 2020-07-26 MED ORDER — SODIUM CHLORIDE 0.9 % IV SOLN
731.5000 mg | Freq: Once | INTRAVENOUS | Status: AC
  Administered 2020-07-26: 730 mg via INTRAVENOUS
  Filled 2020-07-26: qty 73

## 2020-07-26 MED ORDER — SODIUM CHLORIDE 0.9 % IV SOLN
Freq: Once | INTRAVENOUS | Status: AC
  Filled 2020-07-26: qty 250

## 2020-07-26 MED ORDER — SODIUM CHLORIDE 0.9 % IV SOLN
800.0000 mg/m2/d | INTRAVENOUS | Status: DC
  Administered 2020-07-26: 6700 mg via INTRAVENOUS
  Filled 2020-07-26: qty 134

## 2020-07-26 MED ORDER — SODIUM CHLORIDE 0.9 % IV SOLN
200.0000 mg | Freq: Once | INTRAVENOUS | Status: AC
  Administered 2020-07-26: 200 mg via INTRAVENOUS
  Filled 2020-07-26: qty 8

## 2020-07-26 MED ORDER — DIPHENHYDRAMINE HCL 25 MG PO CAPS
ORAL_CAPSULE | ORAL | Status: AC
  Filled 2020-07-26: qty 1

## 2020-07-26 MED ORDER — SODIUM CHLORIDE 0.9 % IV SOLN
150.0000 mg | Freq: Once | INTRAVENOUS | Status: AC
  Administered 2020-07-26: 150 mg via INTRAVENOUS
  Filled 2020-07-26: qty 150

## 2020-07-26 MED ORDER — SODIUM CHLORIDE 0.9% FLUSH
10.0000 mL | Freq: Once | INTRAVENOUS | Status: AC
  Administered 2020-07-26: 10 mL
  Filled 2020-07-26: qty 10

## 2020-07-26 MED ORDER — PALONOSETRON HCL INJECTION 0.25 MG/5ML
0.2500 mg | Freq: Once | INTRAVENOUS | Status: AC
  Administered 2020-07-26: 0.25 mg via INTRAVENOUS

## 2020-07-26 NOTE — Assessment & Plan Note (Signed)
He is aware that chronic constipation would aggravate and worsen his problem with nausea The patient is adamant when he is nauseated, he will not take laxatives

## 2020-07-26 NOTE — Assessment & Plan Note (Signed)
This is due to chemotherapy He is not symptomatic He will get G-CSF support next week as scheduled

## 2020-07-26 NOTE — Patient Instructions (Signed)
[facility] Discharge Instructions for Patients Receiving Chemotherapy  Today you received the following chemotherapy agents: pembrolizumab (keytruda)/carboplatin/fluorouracil (5FU).  To help prevent nausea and vomiting after your treatment, we encourage you to take your nausea medication as directed.   If you develop nausea and vomiting that is not controlled by your nausea medication, call the clinic.   BELOW ARE SYMPTOMS THAT SHOULD BE REPORTED IMMEDIATELY:  *FEVER GREATER THAN 100.5 F  *CHILLS WITH OR WITHOUT FEVER  NAUSEA AND VOMITING THAT IS NOT CONTROLLED WITH YOUR NAUSEA MEDICATION  *UNUSUAL SHORTNESS OF BREATH  *UNUSUAL BRUISING OR BLEEDING  TENDERNESS IN MOUTH AND THROAT WITH OR WITHOUT PRESENCE OF ULCERS  *URINARY PROBLEMS  *BOWEL PROBLEMS  UNUSUAL RASH Items with * indicate a potential emergency and should be followed up as soon as possible.  Feel free to call the clinic should you have any questions or concerns. The clinic phone number is [phone].  Please show the [place] at check-in to the Emergency Department and triage nurse.  Pembrolizumab injection What is this medicine? PEMBROLIZUMAB (pem broe liz ue mab) is a monoclonal antibody. It is used to treat certain types of cancer. This medicine may be used for other purposes; ask your health care provider or pharmacist if you have questions. COMMON BRAND NAME(S): Keytruda What should I tell my health care provider before I take this medicine? They need to know if you have any of these conditions:  diabetes  immune system problems  inflammatory bowel disease  liver disease  lung or breathing disease  lupus  received or scheduled to receive an organ transplant or a stem-cell transplant that uses donor stem cells  an unusual or allergic reaction to pembrolizumab, other medicines, foods, dyes, or preservatives  pregnant or trying to get pregnant  breast-feeding How  should I use this medicine? This medicine is for infusion into a vein. It is given by a health care professional in a hospital or clinic setting. A special MedGuide will be given to you before each treatment. Be sure to read this information carefully each time. Talk to your pediatrician regarding the use of this medicine in children. While this drug may be prescribed for children as young as 6 months for selected conditions, precautions do apply. Overdosage: If you think you have taken too much of this medicine contact a poison control center or emergency room at once. NOTE: This medicine is only for you. Do not share this medicine with others. What if I miss a dose? It is important not to miss your dose. Call your doctor or health care professional if you are unable to keep an appointment. What may interact with this medicine? Interactions have not been studied. Give your health care provider a list of all the medicines, herbs, non-prescription drugs, or dietary supplements you use. Also tell them if you smoke, drink alcohol, or use illegal drugs. Some items may interact with your medicine. This list may not describe all possible interactions. Give your health care provider a list of all the medicines, herbs, non-prescription drugs, or dietary supplements you use. Also tell them if you smoke, drink alcohol, or use illegal drugs. Some items may interact with your medicine. What should I watch for while using this medicine? Your condition will be monitored carefully while you are receiving this medicine. You may need blood work done while you are taking this medicine. Do not become pregnant while taking this medicine or for 4 months after stopping it.  Women should inform their doctor if they wish to become pregnant or think they might be pregnant. There is a potential for serious side effects to an unborn child. Talk to your health care professional or pharmacist for more information. Do not  breast-feed an infant while taking this medicine or for 4 months after the last dose. What side effects may I notice from receiving this medicine? Side effects that you should report to your doctor or health care professional as soon as possible:  allergic reactions like skin rash, itching or hives, swelling of the face, lips, or tongue  bloody or black, tarry  breathing problems  changes in vision  chest pain  chills  confusion  constipation  cough  diarrhea  dizziness or feeling faint or lightheaded  fast or irregular heartbeat  fever  flushing  joint pain  low blood counts - this medicine may decrease the number of white blood cells, red blood cells and platelets. You may be at increased risk for infections and bleeding.  muscle pain  muscle weakness  pain, tingling, numbness in the hands or feet  persistent headache  redness, blistering, peeling or loosening of the skin, including inside the mouth  signs and symptoms of high blood sugar such as dizziness; dry mouth; dry skin; fruity breath; nausea; stomach pain; increased hunger or thirst; increased urination  signs and symptoms of kidney injury like trouble passing urine or change in the amount of urine  signs and symptoms of liver injury like dark urine, light-colored stools, loss of appetite, nausea, right upper belly pain, yellowing of the eyes or skin  sweating  swollen lymph nodes  weight loss Side effects that usually do not require medical attention (report to your doctor or health care professional if they continue or are bothersome):  decreased appetite  hair loss  muscle pain  tiredness This list may not describe all possible side effects. Call your doctor for medical advice about side effects. You may report side effects to FDA at 1-800-FDA-1088. Where should I keep my medicine? This drug is given in a hospital or clinic and will not be stored at home. NOTE: This sheet is a summary.  It may not cover all possible information. If you have questions about this medicine, talk to your doctor, pharmacist, or health care provider.  2020 Elsevier/Gold Standard (2019-07-28 18:07:58)  Carboplatin injection What is this medicine? CARBOPLATIN (KAR boe pla tin) is a chemotherapy drug. It targets fast dividing cells, like cancer cells, and causes these cells to die. This medicine is used to treat ovarian cancer and many other cancers. This medicine may be used for other purposes; ask your health care provider or pharmacist if you have questions. COMMON BRAND NAME(S): Paraplatin What should I tell my health care provider before I take this medicine? They need to know if you have any of these conditions:  blood disorders  hearing problems  kidney disease  recent or ongoing radiation therapy  an unusual or allergic reaction to carboplatin, cisplatin, other chemotherapy, other medicines, foods, dyes, or preservatives  pregnant or trying to get pregnant  breast-feeding How should I use this medicine? This drug is usually given as an infusion into a vein. It is administered in a hospital or clinic by a specially trained health care professional. Talk to your pediatrician regarding the use of this medicine in children. Special care may be needed. Overdosage: If you think you have taken too much of this medicine contact a poison control center  or emergency room at once. NOTE: This medicine is only for you. Do not share this medicine with others. What if I miss a dose? It is important not to miss a dose. Call your doctor or health care professional if you are unable to keep an appointment. What may interact with this medicine?  medicines for seizures  medicines to increase blood counts like filgrastim, pegfilgrastim, sargramostim  some antibiotics like amikacin, gentamicin, neomycin, streptomycin, tobramycin  vaccines Talk to your doctor or health care professional before  taking any of these medicines:  acetaminophen  aspirin  ibuprofen  ketoprofen  naproxen This list may not describe all possible interactions. Give your health care provider a list of all the medicines, herbs, non-prescription drugs, or dietary supplements you use. Also tell them if you smoke, drink alcohol, or use illegal drugs. Some items may interact with your medicine. What should I watch for while using this medicine? Your condition will be monitored carefully while you are receiving this medicine. You will need important blood work done while you are taking this medicine. This drug may make you feel generally unwell. This is not uncommon, as chemotherapy can affect healthy cells as well as cancer cells. Report any side effects. Continue your course of treatment even though you feel ill unless your doctor tells you to stop. In some cases, you may be given additional medicines to help with side effects. Follow all directions for their use. Call your doctor or health care professional for advice if you get a fever, chills or sore throat, or other symptoms of a cold or flu. Do not treat yourself. This drug decreases your body's ability to fight infections. Try to avoid being around people who are sick. This medicine may increase your risk to bruise or bleed. Call your doctor or health care professional if you notice any unusual bleeding. Be careful brushing and flossing your teeth or using a toothpick because you may get an infection or bleed more easily. If you have any dental work done, tell your dentist you are receiving this medicine. Avoid taking products that contain aspirin, acetaminophen, ibuprofen, naproxen, or ketoprofen unless instructed by your doctor. These medicines may hide a fever. Do not become pregnant while taking this medicine. Women should inform their doctor if they wish to become pregnant or think they might be pregnant. There is a potential for serious side effects to an  unborn child. Talk to your health care professional or pharmacist for more information. Do not breast-feed an infant while taking this medicine. What side effects may I notice from receiving this medicine? Side effects that you should report to your doctor or health care professional as soon as possible:  allergic reactions like skin rash, itching or hives, swelling of the face, lips, or tongue  signs of infection - fever or chills, cough, sore throat, pain or difficulty passing urine  signs of decreased platelets or bleeding - bruising, pinpoint red spots on the skin, black, tarry stools, nosebleeds  signs of decreased red blood cells - unusually weak or tired, fainting spells, lightheadedness  breathing problems  changes in hearing  changes in vision  chest pain  high blood pressure  low blood counts - This drug may decrease the number of white blood cells, red blood cells and platelets. You may be at increased risk for infections and bleeding.  nausea and vomiting  pain, swelling, redness or irritation at the injection site  pain, tingling, numbness in the hands or feet  problems with balance, talking, walking  trouble passing urine or change in the amount of urine Side effects that usually do not require medical attention (report to your doctor or health care professional if they continue or are bothersome):  hair loss  loss of appetite  metallic taste in the mouth or changes in taste This list may not describe all possible side effects. Call your doctor for medical advice about side effects. You may report side effects to FDA at 1-800-FDA-1088. Where should I keep my medicine? This drug is given in a hospital or clinic and will not be stored at home. NOTE: This sheet is a summary. It may not cover all possible information. If you have questions about this medicine, talk to your doctor, pharmacist, or health care provider.  2020 Elsevier/Gold Standard (2007-12-27  14:38:05)  Fluorouracil, 5-FU injection What is this medicine? FLUOROURACIL, 5-FU (flure oh YOOR a sil) is a chemotherapy drug. It slows the growth of cancer cells. This medicine is used to treat many types of cancer like breast cancer, colon or rectal cancer, pancreatic cancer, and stomach cancer. This medicine may be used for other purposes; ask your health care provider or pharmacist if you have questions. COMMON BRAND NAME(S): Adrucil What should I tell my health care provider before I take this medicine? They need to know if you have any of these conditions:  blood disorders  dihydropyrimidine dehydrogenase (DPD) deficiency  infection (especially a virus infection such as chickenpox, cold sores, or herpes)  kidney disease  liver disease  malnourished, poor nutrition  recent or ongoing radiation therapy  an unusual or allergic reaction to fluorouracil, other chemotherapy, other medicines, foods, dyes, or preservatives  pregnant or trying to get pregnant  breast-feeding How should I use this medicine? This drug is given as an infusion or injection into a vein. It is administered in a hospital or clinic by a specially trained health care professional. Talk to your pediatrician regarding the use of this medicine in children. Special care may be needed. Overdosage: If you think you have taken too much of this medicine contact a poison control center or emergency room at once. NOTE: This medicine is only for you. Do not share this medicine with others. What if I miss a dose? It is important not to miss your dose. Call your doctor or health care professional if you are unable to keep an appointment. What may interact with this medicine?  allopurinol  cimetidine  dapsone  digoxin  hydroxyurea  leucovorin  levamisole  medicines for seizures like ethotoin, fosphenytoin, phenytoin  medicines to increase blood counts like filgrastim, pegfilgrastim,  sargramostim  medicines that treat or prevent blood clots like warfarin, enoxaparin, and dalteparin  methotrexate  metronidazole  pyrimethamine  some other chemotherapy drugs like busulfan, cisplatin, estramustine, vinblastine  trimethoprim  trimetrexate  vaccines Talk to your doctor or health care professional before taking any of these medicines:  acetaminophen  aspirin  ibuprofen  ketoprofen  naproxen This list may not describe all possible interactions. Give your health care provider a list of all the medicines, herbs, non-prescription drugs, or dietary supplements you use. Also tell them if you smoke, drink alcohol, or use illegal drugs. Some items may interact with your medicine. What should I watch for while using this medicine? Visit your doctor for checks on your progress. This drug may make you feel generally unwell. This is not uncommon, as chemotherapy can affect healthy cells as well as cancer cells. Report any side  effects. Continue your course of treatment even though you feel ill unless your doctor tells you to stop. In some cases, you may be given additional medicines to help with side effects. Follow all directions for their use. Call your doctor or health care professional for advice if you get a fever, chills or sore throat, or other symptoms of a cold or flu. Do not treat yourself. This drug decreases your body's ability to fight infections. Try to avoid being around people who are sick. This medicine may increase your risk to bruise or bleed. Call your doctor or health care professional if you notice any unusual bleeding. Be careful brushing and flossing your teeth or using a toothpick because you may get an infection or bleed more easily. If you have any dental work done, tell your dentist you are receiving this medicine. Avoid taking products that contain aspirin, acetaminophen, ibuprofen, naproxen, or ketoprofen unless instructed by your doctor. These  medicines may hide a fever. Do not become pregnant while taking this medicine. Women should inform their doctor if they wish to become pregnant or think they might be pregnant. There is a potential for serious side effects to an unborn child. Talk to your health care professional or pharmacist for more information. Do not breast-feed an infant while taking this medicine. Men should inform their doctor if they wish to father a child. This medicine may lower sperm counts. Do not treat diarrhea with over the counter products. Contact your doctor if you have diarrhea that lasts more than 2 days or if it is severe and watery. This medicine can make you more sensitive to the sun. Keep out of the sun. If you cannot avoid being in the sun, wear protective clothing and use sunscreen. Do not use sun lamps or tanning beds/booths. What side effects may I notice from receiving this medicine? Side effects that you should report to your doctor or health care professional as soon as possible:  allergic reactions like skin rash, itching or hives, swelling of the face, lips, or tongue  low blood counts - this medicine may decrease the number of white blood cells, red blood cells and platelets. You may be at increased risk for infections and bleeding.  signs of infection - fever or chills, cough, sore throat, pain or difficulty passing urine  signs of decreased platelets or bleeding - bruising, pinpoint red spots on the skin, black, tarry stools, blood in the urine  signs of decreased red blood cells - unusually weak or tired, fainting spells, lightheadedness  breathing problems  changes in vision  chest pain  mouth sores  nausea and vomiting  pain, swelling, redness at site where injected  pain, tingling, numbness in the hands or feet  redness, swelling, or sores on hands or feet  stomach pain  unusual bleeding Side effects that usually do not require medical attention (report to your doctor or  health care professional if they continue or are bothersome):  changes in finger or toe nails  diarrhea  dry or itchy skin  hair loss  headache  loss of appetite  sensitivity of eyes to the light  stomach upset  unusually teary eyes This list may not describe all possible side effects. Call your doctor for medical advice about side effects. You may report side effects to FDA at 1-800-FDA-1088. Where should I keep my medicine? This drug is given in a hospital or clinic and will not be stored at home. NOTE: This sheet is a summary.  It may not cover all possible information. If you have questions about this medicine, talk to your doctor, pharmacist, or health care provider.  2020 Elsevier/Gold Standard (2008-01-25 13:53:16)

## 2020-07-26 NOTE — Patient Instructions (Signed)

## 2020-07-26 NOTE — Assessment & Plan Note (Signed)
He has chronic nausea He will continue Zofran and lorazepam as needed He will come here for IV fluid support as well

## 2020-07-26 NOTE — Progress Notes (Signed)
Oppsite dressing used per patient request. Take home dressing pulls sign off.

## 2020-07-26 NOTE — Progress Notes (Signed)
Christian Simmons OFFICE PROGRESS NOTE  Patient Care Team: Patient, No Pcp Per as PCP - General (General Practice) [name], MD as Consulting Physician (Hematology and Oncology) Eppie Gibson, MD as Attending Physician (Radiation Oncology) Jodi Marble, MD as Consulting Physician (Otolaryngology) Philomena Doheny, MD as Referring Physician (Plastic Surgery) Irene Shipper, MD as Consulting Physician (Gastroenterology) System, Provider Not In  ASSESSMENT & PLAN:  Malignant neoplasm of head and neck [facility]) He tolerated recent treatment poorly due to nausea and constipation The patient requested the dose of dexamethasone to be increased back to 10 mg He will continue here daily for IV fluids until next week The neck lesion is visibly smaller I recommend we proceed for total 6 cycles of treatment before repeating imaging study  Pancytopenia, acquired [facility]) This is due to chemotherapy He is not symptomatic He will get G-CSF support next week as scheduled  Cancer associated pain The dose of oxycodone was recently increased to 15 mg to use as needed He can continue the same dose and to take additional ibuprofen as needed  Nausea and vomiting He has chronic nausea He will continue Zofran and lorazepam as needed He will come here for IV fluid support as well  Other constipation He is aware that chronic constipation would aggravate and worsen his problem with nausea The patient is adamant when he is nauseated, he will not take laxatives   No orders of the defined types were placed in this encounter.   All questions were answered. The patient knows to call the clinic with any problems, questions or concerns. The total time spent in the appointment was 30 minutes encounter with patients including review of chart and various tests results, discussions about plan of care and coordination of care plan   [name], MD 07/26/2020 1:03 PM  INTERVAL HISTORY: Please see below  for problem oriented charting. 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 Pan-endoscopy elsewhere showed cancer from nasopharyngeal space.   01/04/2007 - 02/20/2007 Chemotherapy   He received 2 cycles of cisplatin and 5FU followed by concurrent chemo with weekly cisplatin and radiation. He only received 2 doses of chemo due to severe mucositis, nausea and weight loss.   04/05/2007 - 08/04/2007 Chemotherapy   He received 4 more courses of cisplatin with 5FU and had complete response   07/05/2009 Procedure   Fine-needle aspirate of the right level II lymph nodes come from recurrent metastatic disease. Repeat endoscopy and CT scan show no evidence of disease elsewhere.   07/08/2009 - 12/02/2009 Chemotherapy   He was given 6 cycles of carboplatin, 5-FU and docetaxel   12/03/2009 Surgery   He  has surgery to the residual lymph node on the right neck which showed no evidence of disease.   02/22/2012 Imaging   Repeat imaging study showed large recurrent mass. He was referred elsewhere for further treatment.   05/03/2012 Surgery   He underwent left upper lobectomy.   04/29/2013 Imaging   PEt scan showed  lesion on right level II B and lower lung was abnormal   06/03/2013 - 02/02/2014 Chemotherapy   He had 6 cycles of chemotherapy when he was found to have recurrence of cancer and had received oxaliplatin and capecitabine   06/07/2014 Imaging   PET CT scan showed persistent disease in the right neck lymph nodes and left lung   06/29/2014 Procedure   Accession: [id number] repeat LUL biopsy confirmed metastatic cancer   07/18/2014 - 07/31/2014 Radiation Therapy   He received palliative radiation therapy to the lungs   10/10/2014 Imaging   CT scan of the chest, abdomen and pelvis show regression in the size of the lung nodule in the left upper lobe and stable pulmonary nodules   01/24/2015 Imaging   CT scan showed stable disease in neck and lung   06/19/2015 Imaging   CT scan of the neck and the chest show possible mild progression of the nodule in the right side of the neck.   06/25/2015 Imaging   PET scan confirmed disease recurrence in the neck   07/07/2015 Imaging   He had MRI neck at [facility]   09/03/2015 - 08/26/2018 Chemotherapy   He received palliative chemo with Nivolumab   10/29/2015 Imaging   PET CT showed positive response to Rx   02/28/2016 Imaging   Ct abdomen showed abnormal thinkening in his stomach   03/03/2016 Imaging   CT: Right sternocleidomastoid muscle metastasis appears less distinct but otherwise not significantly changed in size or configuration since 06/19/2015.2. Left level 3 lymph node which was hypermetabolic by PET-CT in January 2017 appears slightly smaller   04/01/2016 Imaging   CT cervical spine showed no acute fracture or traumatic malalignment in the cervical spine   04/22/2016 Procedure   Port-a-cath placed.   06/16/2016 Imaging   Ct neck showed right sternocleidomastoid muscle metastasis is further decreased in conspicuity since May, and has mildly decreased in size since September 2016. Continued stability of sub-centimeter left cervical lymph nodes. No new  or progressive metastatic disease in the neck.   06/16/2016 Imaging   CT chest showed stable masslike radiation fibrosis in the left upper lobe. Stable subcentimeter pulmonary nodules in the bilateral lower lobes. No new or progressive metastatic disease in the chest. Nonobstructing left renal stone.   10/13/2016 Imaging   Ct neck showed unchanged right sternocleidomastoid muscle metastasis. Unchanged subcentimeter left cervical lymph nodes. No evidence of new or progressive metastatic disease in the neck.   10/13/2016 Imaging   CT chest showed tiny hypervascular foci in the liver, not definitely seen on prior imaging of 06/16/2016 and 02/28/2016. Abdomen MRI without and with contrast recommended to further evaluate as metastatic disease is a concern. 2. Stable appearance of post treatment changes left upper lung and scattered tiny bilateral pulmonary nodules.   02/11/2017 Imaging   Ct neck: Lymph node mass right posterior neck appears improved from the prior study. Small posterior lymph nodes on the left unchanged. Occluded right jugular vein unchanged.   02/11/2017 Imaging   1. Similar appearance of postsurgical and radiation changes in the left upper lobe. 2. Similar bilateral pulmonary nodules. 3. No thoracic adenopathy. 4. Subtle foci  of post-contrast enhancement within the liver are suboptimally characterized on this nondedicated study. Likely similar. These could either be re-evaluated at followup or more entirely characterized with abdominal MRI. 5. Left nephrolithiasis.   05/19/2017 Imaging   Matted lymph node mass right posterior neck appears larger in the recent CT. Accurate measurements difficult due to infiltrating tumor margins and infiltration of the muscle. Right jugular vein again appears occluded or resected. Small left posterior lymph nodes stable. Left upper lobe airspace density stable and similar to the prior CT   06/03/2017 PET scan   1. Hypermetabolic ill-defined right level  IIb lymph node, about 1.3 cm in diameter with maximum SUV 9.5 (formerly 8.1). Appearance suspicious for residual/recurrent malignancy. No worrisome left-sided lesion. 2. Left suprahilar indistinct opacity demonstrates no worrisome hypermetabolic activity. The 5 mm left lower lobe pulmonary nodule is stable and not currently hypermetabolic although below sensitive PET-CT size thresholds. 3. Other imaging findings of potential clinical significance: Bilateral nonobstructive nephrolithiasis. Chronic bilateral maxillary sinusitis.   05/11/2018 PET scan   1. Continued chronic accentuated metabolic activity in the vicinity of right level IIB and the adjacent right sternocleidomastoid muscle, with ill definition of surrounding tissue planes. Maximum SUV is currently 8.1, formerly 9.5. Accentuated metabolic activity is been present in this vicinity back through 06/25/2015, and there was also some low-level activity in this vicinity on 06/07/2014. Some of this may be from scarring and local muscular activity although clearly a component of residual tumor is difficult to exclude given the focally high activity. 2. Other imaging findings of potential clinical significance: Chronic bilateral maxillary sinusitis. Chronic scarring in the left upper lobe. Chronically stable 5 mm left lower lobe nodule is considered benign. Nonobstructive left nephrolithiasis.   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 COMMENT:No significant keratinization is identified. Other basaloid carcinomas are in the differential diagnosis. No cell block material is available for further testing.   09/12/2018 Procedure   He underwent fine Needle Aspiration   10/04/2018 PET scan   1. Significant progression of local recurrence laterally in the mid right neck with an enlarging, increasingly  hypermetabolic soft tissue mass. This involves the right sternocleidomastoid muscle. 2. Small lymph nodes in the right axilla are increasingly hypermetabolic. These are nonspecific and potentially reactive, although could reflect a small metastases. Small hypermetabolic nodule in the left suprasternal notch is unchanged. 3. No other evidence of metastatic disease.    10/07/2018 - 12/23/2018 Chemotherapy   The patient had cisplatin plus gemzar   12/07/2018 Imaging   1. Decreased size of lateral right neck mass. 2. Unchanged soft tissue nodule in the suprasternal notch. 3. No evidence of new metastatic disease in the neck.    05/30/2019 Imaging   CT neck No clear change or progression compared to the study of March. Overall measurements of the right lateral neck mass are similar, approximately 3 x 1.8 cm. See above discussion. One could argue that there is slight increase in lateral bulging, possibly with an increase in contrast enhancement, towards the inferior margin. This is of questionable validity but could possibly represent some progression or inflammatory change. Other findings in the region are stable.   07/20/2019 - 09/15/2019 Chemotherapy   The patient had dexamethasone (DECADRON) 4 MG tablet, 1 of 1 cycle, Start date: --, End date: -- palonosetron (ALOXI) injection 0.25 mg, 0.25 mg, Intravenous,  Once, 3 of 4 cycles Administration:  0.25 mg (07/20/2019), 0.25 mg (08/10/2019), 0.25 mg (09/08/2019) CISplatin (PLATINOL) 84 mg in sodium chloride 0.9 % 250 mL chemo infusion, 40 mg/m2 = 84 mg (80 % of original dose 50 mg/m2), Intravenous,  Once, 3 of 4 cycles Dose modification: 40 mg/m2 (80 % of original dose 50 mg/m2, Cycle 1, Reason: Dose Not Tolerated) Administration: 84 mg (07/20/2019), 84 mg (08/10/2019), 83 mg (09/08/2019) gemcitabine (GEMZAR) 1,600 mg in sodium chloride 0.9 % 250 mL chemo infusion, 1,672 mg (80 % of original dose 1,000 mg/m2), Intravenous,  Once, 3 of 4 cycles Dose  modification: 800 mg/m2 (80 % of original dose 1,000 mg/m2, Cycle 1, Reason: Provider Judgment) Administration: 1,600 mg (07/20/2019), 1,600 mg (07/27/2019), 1,600 mg (08/10/2019), 1,600 mg (08/17/2019), 1,600 mg (09/08/2019), 1,672 mg (09/15/2019) ondansetron (ZOFRAN) 8 mg, dexamethasone (DECADRON) 10 mg in sodium chloride 0.9 % 50 mL IVPB, , Intravenous,  Once, 3 of 4 cycles Administration:  (09/15/2019) fosaprepitant (EMEND) 150 mg, dexamethasone (DECADRON) 12 mg in sodium chloride 0.9 % 145 mL IVPB, , Intravenous,  Once, 3 of 4 cycles Administration:  (07/20/2019),  (08/10/2019),  (09/08/2019)  for chemotherapy treatment.    10/02/2019 Imaging   CT neck As compared to 05/30/2019, no significant interval change in size of an ill-defined mass within the right lateral neck, again measuring 3.3 x 1.8 cm in transaxial dimensions.   Unchanged mildly enlarged left level I lymph node measuring 1.1 cm in short axis.   Unchanged node or nodule at the thoracic inlet, measuring 1.3 x 0.8 cm.   Please refer to concurrently performed chest CT for a description of findings below the level of the thoracic inlet.     10/02/2019 Imaging   CT chest 1. No new or progressive findings in the chest to suggest metastatic disease. 2. Bilateral subcentimeter solid pulmonary nodules are stable since 2018. 3. Hyperdense 1.1 cm anterior liver focus, not clearly visualized on prior studies. Suggest MRI abdomen without and with IV contrast for further characterization.   10/20/2019 - 12/29/2019 Chemotherapy   The patient had ondansetron (ZOFRAN) injection 8 mg, 8 mg (100 % of original dose 8 mg), Intravenous,  Once, 2 of 5 cycles Dose modification: 8 mg (original dose 8 mg, Cycle 2) Administration: 8 mg (11/17/2019), 8 mg (12/15/2019), 8 mg (12/29/2019) gemcitabine (GEMZAR) 2,000 mg in sodium chloride 0.9 % 250 mL chemo infusion, 2,090 mg, Intravenous,  Once, 3 of 6 cycles Administration: 2,000 mg (10/20/2019), 2,000 mg  (11/03/2019), 2,000 mg (11/17/2019), 2,000 mg (12/15/2019), 2,000 mg (12/29/2019)  for chemotherapy treatment.    06/12/2020 Imaging   1. Enlarging superficial, exophytic component of the chronic right sternocleidomastoid muscle mass. See series 6, image 55. 2. Elsewhere stable CT appearance of the Neck.   06/12/2020 Imaging   Post treatment scarring in the left hemithorax, stable. No evidence recurrent or metastatic disease   06/14/2020 -  Chemotherapy   The patient had palonosetron (ALOXI) injection 0.25 mg, 0.25 mg, Intravenous,  Once, 3 of 4 cycles Administration: 0.25 mg (06/14/2020), 0.25 mg (07/05/2020) fosaprepitant (EMEND) 150 mg in sodium chloride 0.9 % 145 mL IVPB, 150 mg, Intravenous,  Once, 3 of 4 cycles Administration: 150 mg (06/14/2020), 150 mg (07/05/2020) pembrolizumab (KEYTRUDA) 200 mg in sodium chloride 0.9 % 50 mL chemo infusion, 200 mg (100 % of original dose 200 mg), Intravenous, Once, 3 of 4 cycles Dose modification: 200 mg (original dose 200 mg, Cycle 1, Reason: Provider Judgment) Administration: 200 mg (06/14/2020), 200 mg (07/05/2020) fluorouracil (ADRUCIL) 6,750 mg  in sodium chloride 0.9 % 115 mL chemo infusion, 800 mg/m2/day = 6,750 mg (100 % of original dose 800 mg/m2/day), Intravenous, 4D (96 hours ), 3 of 4 cycles Dose modification: 800 mg/m2/day (original dose 800 mg/m2/day, Cycle 1, Reason: Provider Judgment) Administration: 6,750 mg (06/14/2020), 6,750 mg (07/05/2020) CARBOplatin (PARAPLATIN) 750 mg in sodium chloride 0.9 % 250 mL chemo infusion, 750 mg (100 % of original dose 750 mg), Intravenous,  Once, 3 of 4 cycles Dose modification:   (original dose 750 mg, Cycle 1) Administration: 750 mg (06/14/2020), 730 mg (07/05/2020)  for chemotherapy treatment.      REVIEW OF SYSTEMS:   Constitutional: Denies fevers, chills  Eyes: Denies blurriness of vision Ears, nose, mouth, throat, and face: Denies mucositis or sore throat Respiratory: Denies cough, dyspnea or  wheezes Cardiovascular: Denies palpitation, chest discomfort or lower extremity swelling Skin: Denies abnormal skin rashes Lymphatics: Denies new lymphadenopathy or easy bruising Neurological:Denies numbness, tingling or new weaknesses Behavioral/Psych: Mood is stable, no new changes  All other systems were reviewed with the patient and are negative.  I have reviewed the past medical history, past surgical history, social history and family history with the patient and they are unchanged from previous note.  ALLERGIES:  is allergic to phenergan [promethazine hcl], heparin, and clindamycin.  MEDICATIONS:  Current Outpatient Medications  Medication Sig Dispense Refill  . atorvastatin (LIPITOR) 40 MG tablet TK 1 T PO  D    . cetirizine (ZYRTEC) 10 MG tablet TAKE 1 TABLET(10 MG) BY MOUTH DAILY 30 tablet 1  . diclofenac Sodium (VOLTAREN) 1 % GEL Apply topically.    Marland Kitchen ibuprofen (ADVIL) 600 MG tablet TK 1 T PO Q 6 H PRN    . levothyroxine (SYNTHROID) 137 MCG tablet Take 1 tablet (137 mcg total) by mouth daily before breakfast. 90 tablet 9  . lidocaine (XYLOCAINE) 2 % solution Use as directed 5 mLs in the mouth or throat every 3 (three) hours as needed for mouth pain. Swish, gargle and spit 100 mL 2  . LORazepam (ATIVAN) 1 MG tablet Take 1 tablet (1 mg total) by mouth every 8 (eight) hours as needed for anxiety. 30 tablet 0  . omeprazole (PRILOSEC) 40 MG capsule Take 1 capsule (40 mg total) by mouth daily. 30 capsule 9  . ondansetron (ZOFRAN) 8 MG tablet Take 1 tablet (8 mg total) by mouth every 8 (eight) hours as needed for nausea or vomiting. 90 tablet 1  . oxyCODONE (ROXICODONE) 15 MG immediate release tablet Take 1 tablet (15 mg total) by mouth every 4 (four) hours as needed. 90 tablet 0  . PARoxetine (PAXIL) 20 MG tablet Take 1 tablet (20 mg total) by mouth daily. 30 tablet 5  . polyethylene glycol (MIRALAX) packet Take 17 g by mouth daily. (Patient taking differently: Take 17 g by mouth daily  as needed for mild constipation or moderate constipation. ) 14 each 3  . senna-docusate (SENOKOT-S) 8.6-50 MG tablet Take 2 tablets by mouth 3 (three) times daily. 90 tablet 0  . Vitamin D, Ergocalciferol, (DRISDOL) 1.25 MG (50000 UT) CAPS capsule TK 1 C PO WEEKLY     No current facility-administered medications for this visit.   Facility-Administered Medications Ordered in Other Visits  Medication Dose Route Frequency Provider Last Rate Last Admin  . anticoagulant sodium citrate solution 5 mL  5 mL Intracatheter Once Marykay Mccleod, MD      . anticoagulant sodium citrate solution 5 mL  5 mL Intracatheter Once Sundance, Kayann Maj,  MD      . CARBOplatin (PARAPLATIN) 730 mg in sodium chloride 0.9 % 250 mL chemo infusion  730 mg Intravenous Once Alvy Bimler, Carletta Feasel, MD      . dexamethasone (DECADRON) 10 mg in sodium chloride 0.9 % 50 mL IVPB  10 mg Intravenous Once Alvy Bimler, Carneshia Raker, MD 204 mL/hr at 07/26/20 1302 10 mg at 07/26/20 1302  . fluorouracil (ADRUCIL) 6,700 mg in sodium chloride 0.9 % 116 mL chemo infusion  800 mg/m2/day (Order-Specific) Intravenous 4 days Alvy Bimler, Allysen Lazo, MD      . fosaprepitant (EMEND) 150 mg in sodium chloride 0.9 % 145 mL IVPB  150 mg Intravenous Once Alvy Bimler, Shatisha Falter, MD      . pembrolizumab (KEYTRUDA) 200 mg in sodium chloride 0.9 % 50 mL chemo infusion  200 mg Intravenous Once Alvy Bimler, Jjesus Dingley, MD        PHYSICAL EXAMINATION: ECOG PERFORMANCE STATUS: 1 - Symptomatic but completely ambulatory  Vitals:   07/26/20 1201  BP: 130/74  Pulse: 90  Resp: 18  Temp: (!) 97.5 F (36.4 C)  SpO2: 99%   Filed Weights   07/26/20 1201  Weight: 198 lb (89.8 kg)    GENERAL:alert, no distress and comfortable NECK: The tumor mass on the right side of his neck is getting smaller  NEURO: alert & oriented x 3 with fluent speech, no focal motor/sensory deficits  LABORATORY DATA:  I have reviewed the data as listed    Component Value Date/Time   NA 139 07/26/2020 1138   NA 139 09/22/2017 0829   K 3.3 (L)  07/26/2020 1138   K 3.5 09/22/2017 0829   CL 102 07/26/2020 1138   CO2 27 07/26/2020 1138   CO2 26 09/22/2017 0829   GLUCOSE 195 (H) 07/26/2020 1138   GLUCOSE 133 09/22/2017 0829   BUN 13 07/26/2020 1138   BUN 14.1 09/22/2017 0829   CREATININE 1.09 07/26/2020 1138   CREATININE 0.93 02/06/2019 1215   CREATININE 0.9 09/22/2017 0829   CALCIUM 9.4 07/26/2020 1138   CALCIUM 9.1 09/22/2017 0829   PROT 6.9 07/26/2020 1138   PROT 6.8 09/22/2017 0829   ALBUMIN 4.1 07/26/2020 1138   ALBUMIN 4.1 09/22/2017 0829   AST 21 07/26/2020 1138   AST 31 02/06/2019 1215   AST 22 09/22/2017 0829   ALT 28 07/26/2020 1138   ALT 48 (H) 02/06/2019 1215   ALT 30 09/22/2017 0829   ALKPHOS 66 07/26/2020 1138   ALKPHOS 55 09/22/2017 0829   BILITOT 0.3 07/26/2020 1138   BILITOT 0.2 (L) 02/06/2019 1215   BILITOT 0.35 09/22/2017 0829   GFRNONAA >60 07/26/2020 1138   GFRNONAA >60 02/06/2019 1215   GFRAA >60 07/05/2020 0845   GFRAA >60 02/06/2019 1215    No results found for: SPEP, UPEP  Lab Results  Component Value Date   WBC 3.2 (L) 07/26/2020   NEUTROABS 1.6 (L) 07/26/2020   HGB 13.1 07/26/2020   HCT 39.2 07/26/2020   MCV 93.1 07/26/2020   PLT 142 (L) 07/26/2020      Chemistry      Component Value Date/Time   NA 139 07/26/2020 1138   NA 139 09/22/2017 0829   K 3.3 (L) 07/26/2020 1138   K 3.5 09/22/2017 0829   CL 102 07/26/2020 1138   CO2 27 07/26/2020 1138   CO2 26 09/22/2017 0829   BUN 13 07/26/2020 1138   BUN 14.1 09/22/2017 0829   CREATININE 1.09 07/26/2020 1138   CREATININE 0.93 02/06/2019 1215   CREATININE  0.9 09/22/2017 0829      Component Value Date/Time   CALCIUM 9.4 07/26/2020 1138   CALCIUM 9.1 09/22/2017 0829   ALKPHOS 66 07/26/2020 1138   ALKPHOS 55 09/22/2017 0829   AST 21 07/26/2020 1138   AST 31 02/06/2019 1215   AST 22 09/22/2017 0829   ALT 28 07/26/2020 1138   ALT 48 (H) 02/06/2019 1215   ALT 30 09/22/2017 0829   BILITOT 0.3 07/26/2020 1138   BILITOT 0.2  (L) 02/06/2019 1215   BILITOT 0.35 09/22/2017 6945

## 2020-07-26 NOTE — Assessment & Plan Note (Signed)
The dose of oxycodone was recently increased to 15 mg to use as needed He can continue the same dose and to take additional ibuprofen as needed

## 2020-07-26 NOTE — Assessment & Plan Note (Signed)
He tolerated recent treatment poorly due to nausea and constipation The patient requested the dose of dexamethasone to be increased back to 10 mg He will continue here daily for IV fluids until next week The neck lesion is visibly smaller I recommend we proceed for total 6 cycles of treatment before repeating imaging study

## 2020-07-26 NOTE — Progress Notes (Signed)
   Covid-19 Vaccination Clinic  Name:  Christian Simmons    MRN: [id number] DOB: 24-Sep-1986  07/26/2020  Christian Simmons was observed post Covid-19 immunization for 15 minutes without incident. He was provided with Vaccine Information Sheet and instruction to access the V-Safe system.   Christian Simmons was instructed to call 911 with any severe reactions post vaccine: Marland Kitchen Difficulty breathing  . Swelling of face and throat  . A fast heartbeat  . A bad rash all over body  . Dizziness and weakness

## 2020-07-27 ENCOUNTER — Inpatient Hospital Stay: Payer: Medicaid Other

## 2020-07-27 VITALS — BP 119/73 | HR 78 | Temp 98.3°F | Resp 18

## 2020-07-27 DIAGNOSIS — Z95828 Presence of other vascular implants and grafts: Secondary | ICD-10-CM

## 2020-07-27 DIAGNOSIS — C7800 Secondary malignant neoplasm of unspecified lung: Secondary | ICD-10-CM

## 2020-07-27 DIAGNOSIS — C760 Malignant neoplasm of head, face and neck: Secondary | ICD-10-CM

## 2020-07-27 DIAGNOSIS — Z5111 Encounter for antineoplastic chemotherapy: Secondary | ICD-10-CM | POA: Diagnosis not present

## 2020-07-27 MED ORDER — SODIUM CHLORIDE 0.9 % IV SOLN
Freq: Once | INTRAVENOUS | Status: AC
  Filled 2020-07-27: qty 250

## 2020-07-27 MED ORDER — ONDANSETRON HCL 4 MG/2ML IJ SOLN
8.0000 mg | Freq: Once | INTRAMUSCULAR | Status: AC
  Administered 2020-07-27: 8 mg via INTRAVENOUS

## 2020-07-27 MED ORDER — ONDANSETRON HCL 4 MG/2ML IJ SOLN
INTRAMUSCULAR | Status: AC
  Filled 2020-07-27: qty 4

## 2020-07-29 ENCOUNTER — Other Ambulatory Visit: Payer: Self-pay

## 2020-07-29 ENCOUNTER — Inpatient Hospital Stay: Payer: Medicaid Other

## 2020-07-29 VITALS — BP 117/62 | HR 69 | Temp 98.0°F | Resp 18

## 2020-07-29 DIAGNOSIS — Z5111 Encounter for antineoplastic chemotherapy: Secondary | ICD-10-CM | POA: Diagnosis not present

## 2020-07-29 DIAGNOSIS — C7800 Secondary malignant neoplasm of unspecified lung: Secondary | ICD-10-CM

## 2020-07-29 DIAGNOSIS — C760 Malignant neoplasm of head, face and neck: Secondary | ICD-10-CM

## 2020-07-29 DIAGNOSIS — Z95828 Presence of other vascular implants and grafts: Secondary | ICD-10-CM

## 2020-07-29 MED ORDER — SODIUM CHLORIDE 0.9 % IV SOLN
Freq: Once | INTRAVENOUS | Status: AC
  Filled 2020-07-29: qty 250

## 2020-07-29 NOTE — Progress Notes (Signed)
Infusion of liter of NS almost complete, pt states he wants to go home now & eat, does not want to wait for completion of fluids.

## 2020-07-29 NOTE — Patient Instructions (Signed)
Dehydration, Adult Dehydration is a condition in which there is not enough water or other fluids in the body. This happens when a person loses more fluids than he or she takes in. Important organs, such as the kidneys, brain, and heart, cannot function without a proper amount of fluids. Any loss of fluids from the body can lead to dehydration. Dehydration can be mild, moderate, or severe. It should be treated right away to prevent it from becoming severe. What are the causes? Dehydration may be caused by:  Conditions that cause loss of water or other fluids, such as diarrhea, vomiting, or sweating or urinating a lot.  Not drinking enough fluids, especially when you are ill or doing activities that require a lot of energy.  Other illnesses and conditions, such as fever or infection.  Certain medicines, such as medicines that remove excess fluid from the body (diuretics).  Lack of safe drinking water.  Not being able to get enough water and food. What increases the risk? The following factors may make you more likely to develop this condition:  Having a long-term (chronic) illness that has not been treated properly, such as diabetes, heart disease, or kidney disease.  Being 65 years of age or older.  Having a disability.  Living in a place that is high in altitude, where thinner, drier air causes more fluid loss.  Doing exercises that put stress on your body for a long time (endurance sports). What are the signs or symptoms? Symptoms of dehydration depend on how severe it is. Mild or moderate dehydration  Thirst.  Dry lips or dry mouth.  Dizziness or light-headedness, especially when standing up from a seated position.  Muscle cramps.  Dark urine. Urine may be the color of tea.  Less urine or tears produced than usual.  Headache. Severe dehydration  Changes in skin. Your skin may be cold and clammy, blotchy, or pale. Your skin also may not return to normal after being  lightly pinched and released.  Little or no tears, urine, or sweat.  Changes in vital signs, such as rapid breathing and low blood pressure. Your pulse may be weak or may be faster than 100 beats a minute when you are sitting still.  Other changes, such as: ? Feeling very thirsty. ? Sunken eyes. ? Cold hands and feet. ? Confusion. ? Being very tired (lethargic) or having trouble waking from sleep. ? Short-term weight loss. ? Loss of consciousness. How is this diagnosed? This condition is diagnosed based on your symptoms and a physical exam. You may have blood and urine tests to help confirm the diagnosis. How is this treated? Treatment for this condition depends on how severe it is. Treatment should be started right away. Do not wait until dehydration becomes severe. Severe dehydration is an emergency and needs to be treated in a hospital.  Mild or moderate dehydration can be treated at home. You may be asked to: ? Drink more fluids. ? Drink an oral rehydration solution (ORS). This drink helps restore proper amounts of fluids and salts and minerals in the blood (electrolytes).  Severe dehydration can be treated: ? With IV fluids. ? By correcting abnormal levels of electrolytes. This is often done by giving electrolytes through a tube that is passed through your nose and into your stomach (nasogastric tube, or NG tube). ? By treating the underlying cause of dehydration. Follow these instructions at home: Oral rehydration solution If told by your health care provider, drink an ORS:  Make   an ORS by following instructions on the package.  Start by drinking small amounts, about  cup (120 mL) every 5-10 minutes.  Slowly increase how much you drink until you have taken the amount recommended by your health care provider. Eating and drinking         Drink enough clear fluid to keep your urine pale yellow. If you were told to drink an ORS, finish the ORS first and then start slowly  drinking other clear fluids. Drink fluids such as: ? Water. Do not drink only water. Doing that can lead to hyponatremia, which is having too little salt (sodium) in the body. ? Water from ice chips you suck on. ? Fruit juice that you have added water to (diluted fruit juice). ? Low-calorie sports drinks.  Eat foods that contain a healthy balance of electrolytes, such as bananas, oranges, potatoes, tomatoes, and spinach.  Do not drink alcohol.  Avoid the following: ? Drinks that contain a lot of sugar. These include high-calorie sports drinks, fruit juice that is not diluted, and soda. ? Caffeine. ? Foods that are greasy or contain a lot of fat or sugar. General instructions  Take over-the-counter and prescription medicines only as told by your health care provider.  Do not take sodium tablets. Doing that can lead to having too much sodium in the body (hypernatremia).  Return to your normal activities as told by your health care provider. Ask your health care provider what activities are safe for you.  Keep all follow-up visits as told by your health care provider. This is important. Contact a health care provider if:  You have muscle cramps, pain, or discomfort, such as: ? Pain in your abdomen and the pain gets worse or stays in one area (localizes). ? Stiff neck.  You have a rash.  You are more irritable than usual.  You are sleepier or have a harder time waking than usual.  You feel weak or dizzy.  You feel very thirsty. Get help right away if you have:  Any symptoms of severe dehydration.  Symptoms of vomiting, such as: ? You cannot eat or drink without vomiting. ? Vomiting gets worse or does not go away. ? Vomit includes blood or green matter (bile).  Symptoms that get worse with treatment.  A fever.  A severe headache.  Problems with urination or bowel movements, such as: ? Diarrhea that gets worse or does not go away. ? Blood in your stool (feces). This  may cause stool to look black and tarry. ? Not urinating, or urinating only a small amount of very dark urine, within 6-8 hours.  Trouble breathing. These symptoms may represent a serious problem that is an emergency. Do not wait to see if the symptoms will go away. Get medical help right away. Call your local emergency services (911 in the [place] Do not drive yourself to the hospital. Summary  Dehydration is a condition in which there is not enough water or other fluids in the body. This happens when a person loses more fluids than he or she takes in.  Treatment for this condition depends on how severe it is. Treatment should be started right away. Do not wait until dehydration becomes severe.  Drink enough clear fluid to keep your urine pale yellow. If you were told to drink an oral rehydration solution (ORS), finish the ORS first and then start slowly drinking other clear fluids.  Take over-the-counter and prescription medicines only as told by your health care   provider.  Get help right away if you have any symptoms of severe dehydration. This information is not intended to replace advice given to you by your health care provider. Make sure you discuss any questions you have with your health care provider. Document Revised: 05/04/2019 Document Reviewed: 05/04/2019 [employer] Patient Education  2020 [employer]   

## 2020-07-30 ENCOUNTER — Other Ambulatory Visit: Payer: Self-pay

## 2020-07-30 ENCOUNTER — Inpatient Hospital Stay: Payer: Medicaid Other

## 2020-07-30 VITALS — BP 128/72 | HR 68 | Temp 98.1°F | Resp 18

## 2020-07-30 DIAGNOSIS — Z7189 Other specified counseling: Secondary | ICD-10-CM

## 2020-07-30 DIAGNOSIS — C7800 Secondary malignant neoplasm of unspecified lung: Secondary | ICD-10-CM

## 2020-07-30 DIAGNOSIS — C760 Malignant neoplasm of head, face and neck: Secondary | ICD-10-CM

## 2020-07-30 DIAGNOSIS — Z5111 Encounter for antineoplastic chemotherapy: Secondary | ICD-10-CM | POA: Diagnosis not present

## 2020-07-30 DIAGNOSIS — Z95828 Presence of other vascular implants and grafts: Secondary | ICD-10-CM

## 2020-07-30 MED ORDER — ANTICOAGULANT SODIUM CITRATE 4% (200MG/5ML) IV SOLN
5.0000 mL | Freq: Once | Status: AC
  Administered 2020-07-30: 5 mL
  Filled 2020-07-30: qty 5

## 2020-07-30 MED ORDER — SODIUM CHLORIDE 0.9% FLUSH
10.0000 mL | INTRAVENOUS | Status: DC | PRN
  Administered 2020-07-30: 10 mL
  Filled 2020-07-30: qty 10

## 2020-07-30 MED ORDER — SODIUM CHLORIDE 0.9 % IV SOLN
Freq: Once | INTRAVENOUS | Status: AC
  Filled 2020-07-30: qty 250

## 2020-07-30 NOTE — Patient Instructions (Signed)
Dehydration, Adult Dehydration is a condition in which there is not enough water or other fluids in the body. This happens when a person loses more fluids than he or she takes in. Important organs, such as the kidneys, brain, and heart, cannot function without a proper amount of fluids. Any loss of fluids from the body can lead to dehydration. Dehydration can be mild, moderate, or severe. It should be treated right away to prevent it from becoming severe. What are the causes? Dehydration may be caused by:  Conditions that cause loss of water or other fluids, such as diarrhea, vomiting, or sweating or urinating a lot.  Not drinking enough fluids, especially when you are ill or doing activities that require a lot of energy.  Other illnesses and conditions, such as fever or infection.  Certain medicines, such as medicines that remove excess fluid from the body (diuretics).  Lack of safe drinking water.  Not being able to get enough water and food. What increases the risk? The following factors may make you more likely to develop this condition:  Having a long-term (chronic) illness that has not been treated properly, such as diabetes, heart disease, or kidney disease.  Being 65 years of age or older.  Having a disability.  Living in a place that is high in altitude, where thinner, drier air causes more fluid loss.  Doing exercises that put stress on your body for a long time (endurance sports). What are the signs or symptoms? Symptoms of dehydration depend on how severe it is. Mild or moderate dehydration  Thirst.  Dry lips or dry mouth.  Dizziness or light-headedness, especially when standing up from a seated position.  Muscle cramps.  Dark urine. Urine may be the color of tea.  Less urine or tears produced than usual.  Headache. Severe dehydration  Changes in skin. Your skin may be cold and clammy, blotchy, or pale. Your skin also may not return to normal after being  lightly pinched and released.  Little or no tears, urine, or sweat.  Changes in vital signs, such as rapid breathing and low blood pressure. Your pulse may be weak or may be faster than 100 beats a minute when you are sitting still.  Other changes, such as: ? Feeling very thirsty. ? Sunken eyes. ? Cold hands and feet. ? Confusion. ? Being very tired (lethargic) or having trouble waking from sleep. ? Short-term weight loss. ? Loss of consciousness. How is this diagnosed? This condition is diagnosed based on your symptoms and a physical exam. You may have blood and urine tests to help confirm the diagnosis. How is this treated? Treatment for this condition depends on how severe it is. Treatment should be started right away. Do not wait until dehydration becomes severe. Severe dehydration is an emergency and needs to be treated in a hospital.  Mild or moderate dehydration can be treated at home. You may be asked to: ? Drink more fluids. ? Drink an oral rehydration solution (ORS). This drink helps restore proper amounts of fluids and salts and minerals in the blood (electrolytes).  Severe dehydration can be treated: ? With IV fluids. ? By correcting abnormal levels of electrolytes. This is often done by giving electrolytes through a tube that is passed through your nose and into your stomach (nasogastric tube, or NG tube). ? By treating the underlying cause of dehydration. Follow these instructions at home: Oral rehydration solution If told by your health care provider, drink an ORS:  Make   an ORS by following instructions on the package.  Start by drinking small amounts, about  cup (120 mL) every 5-10 minutes.  Slowly increase how much you drink until you have taken the amount recommended by your health care provider. Eating and drinking         Drink enough clear fluid to keep your urine pale yellow. If you were told to drink an ORS, finish the ORS first and then start slowly  drinking other clear fluids. Drink fluids such as: ? Water. Do not drink only water. Doing that can lead to hyponatremia, which is having too little salt (sodium) in the body. ? Water from ice chips you suck on. ? Fruit juice that you have added water to (diluted fruit juice). ? Low-calorie sports drinks.  Eat foods that contain a healthy balance of electrolytes, such as bananas, oranges, potatoes, tomatoes, and spinach.  Do not drink alcohol.  Avoid the following: ? Drinks that contain a lot of sugar. These include high-calorie sports drinks, fruit juice that is not diluted, and soda. ? Caffeine. ? Foods that are greasy or contain a lot of fat or sugar. General instructions  Take over-the-counter and prescription medicines only as told by your health care provider.  Do not take sodium tablets. Doing that can lead to having too much sodium in the body (hypernatremia).  Return to your normal activities as told by your health care provider. Ask your health care provider what activities are safe for you.  Keep all follow-up visits as told by your health care provider. This is important. Contact a health care provider if:  You have muscle cramps, pain, or discomfort, such as: ? Pain in your abdomen and the pain gets worse or stays in one area (localizes). ? Stiff neck.  You have a rash.  You are more irritable than usual.  You are sleepier or have a harder time waking than usual.  You feel weak or dizzy.  You feel very thirsty. Get help right away if you have:  Any symptoms of severe dehydration.  Symptoms of vomiting, such as: ? You cannot eat or drink without vomiting. ? Vomiting gets worse or does not go away. ? Vomit includes blood or green matter (bile).  Symptoms that get worse with treatment.  A fever.  A severe headache.  Problems with urination or bowel movements, such as: ? Diarrhea that gets worse or does not go away. ? Blood in your stool (feces). This  may cause stool to look black and tarry. ? Not urinating, or urinating only a small amount of very dark urine, within 6-8 hours.  Trouble breathing. These symptoms may represent a serious problem that is an emergency. Do not wait to see if the symptoms will go away. Get medical help right away. Call your local emergency services (911 in the [place] Do not drive yourself to the hospital. Summary  Dehydration is a condition in which there is not enough water or other fluids in the body. This happens when a person loses more fluids than he or she takes in.  Treatment for this condition depends on how severe it is. Treatment should be started right away. Do not wait until dehydration becomes severe.  Drink enough clear fluid to keep your urine pale yellow. If you were told to drink an oral rehydration solution (ORS), finish the ORS first and then start slowly drinking other clear fluids.  Take over-the-counter and prescription medicines only as told by your health care   provider.  Get help right away if you have any symptoms of severe dehydration. This information is not intended to replace advice given to you by your health care provider. Make sure you discuss any questions you have with your health care provider. Document Revised: 05/04/2019 Document Reviewed: 05/04/2019 [employer] Patient Education  2020 [employer]   

## 2020-07-30 NOTE — Progress Notes (Signed)
Patient received 901 ml of fluid. Stated he was ready to leave. Port was de accessed, vitals were stable patient was discharged.

## 2020-07-31 ENCOUNTER — Ambulatory Visit: Payer: Medicaid Other

## 2020-07-31 ENCOUNTER — Telehealth: Payer: Self-pay | Admitting: Emergency Medicine

## 2020-07-31 ENCOUNTER — Inpatient Hospital Stay: Payer: Medicaid Other

## 2020-07-31 NOTE — Telephone Encounter (Signed)
Contacted pt regarding missed IVF appt today.  Pt states it was supposed to be cancelled yesterday as he doesn't receive any more IVF after his pump is removed.  Pt also requested that IVF appt for tomorrow (10/28) be cancelled as well.  Both appts cancelled.  Pt denies any further questions/concerns at this time.

## 2020-08-01 ENCOUNTER — Inpatient Hospital Stay: Payer: Medicaid Other

## 2020-08-05 ENCOUNTER — Telehealth: Payer: Self-pay

## 2020-08-05 ENCOUNTER — Other Ambulatory Visit: Payer: Self-pay

## 2020-08-05 MED ORDER — MAGIC MOUTHWASH W/LIDOCAINE: 5.0000 mL | Freq: Four times a day (QID) | ORAL | 0 refills | Status: AC

## 2020-08-05 NOTE — Telephone Encounter (Signed)
Called and given below message. He verbalized understanding. Rx called into [facility].

## 2020-08-05 NOTE — Telephone Encounter (Signed)
Pls call in magic mouth wash swish and spit with lidocaine 4 x per day for 1 week

## 2020-08-05 NOTE — Telephone Encounter (Signed)
He called and left a message. 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 He is having a hard time eating and drinking due to the pain.

## 2020-08-16 ENCOUNTER — Inpatient Hospital Stay (HOSPITAL_BASED_OUTPATIENT_CLINIC_OR_DEPARTMENT_OTHER): Payer: Medicaid Other | Admitting: Hematology and Oncology

## 2020-08-16 ENCOUNTER — Inpatient Hospital Stay: Payer: Medicaid Other

## 2020-08-16 ENCOUNTER — Encounter: Payer: Self-pay | Admitting: Hematology and Oncology

## 2020-08-16 ENCOUNTER — Other Ambulatory Visit: Payer: Self-pay

## 2020-08-16 ENCOUNTER — Inpatient Hospital Stay: Payer: Medicaid Other | Attending: Hematology and Oncology

## 2020-08-16 ENCOUNTER — Other Ambulatory Visit: Payer: Self-pay | Admitting: Hematology and Oncology

## 2020-08-16 DIAGNOSIS — R112 Nausea with vomiting, unspecified: Secondary | ICD-10-CM | POA: Diagnosis not present

## 2020-08-16 DIAGNOSIS — E039 Hypothyroidism, unspecified: Secondary | ICD-10-CM

## 2020-08-16 DIAGNOSIS — D61818 Other pancytopenia: Secondary | ICD-10-CM

## 2020-08-16 DIAGNOSIS — Z5112 Encounter for antineoplastic immunotherapy: Secondary | ICD-10-CM | POA: Insufficient documentation

## 2020-08-16 DIAGNOSIS — G893 Neoplasm related pain (acute) (chronic): Secondary | ICD-10-CM | POA: Diagnosis not present

## 2020-08-16 DIAGNOSIS — Z5111 Encounter for antineoplastic chemotherapy: Secondary | ICD-10-CM | POA: Diagnosis present

## 2020-08-16 DIAGNOSIS — C119 Malignant neoplasm of nasopharynx, unspecified: Secondary | ICD-10-CM | POA: Diagnosis present

## 2020-08-16 DIAGNOSIS — C760 Malignant neoplasm of head, face and neck: Secondary | ICD-10-CM | POA: Diagnosis not present

## 2020-08-16 DIAGNOSIS — C7800 Secondary malignant neoplasm of unspecified lung: Secondary | ICD-10-CM

## 2020-08-16 DIAGNOSIS — Z7189 Other specified counseling: Secondary | ICD-10-CM

## 2020-08-16 DIAGNOSIS — Z79899 Other long term (current) drug therapy: Secondary | ICD-10-CM | POA: Diagnosis not present

## 2020-08-16 LAB — COMPREHENSIVE METABOLIC PANEL
ALT: 31 U/L (ref 0–44)
AST: 26 U/L (ref 15–41)
Albumin: 4 g/dL (ref 3.5–5.0)
Alkaline Phosphatase: 64 U/L (ref 38–126)
Anion gap: 9 (ref 5–15)
BUN: 12 mg/dL (ref 6–20)
CO2: 28 mmol/L (ref 22–32)
Calcium: 9.1 mg/dL (ref 8.9–10.3)
Chloride: 102 mmol/L (ref 98–111)
Creatinine, Ser: 1.08 mg/dL (ref 0.61–1.24)
GFR, Estimated: >60 mL/min (ref >60)
Glucose, Bld: 151 mg/dL — ABNORMAL HIGH (ref 70–99)
Potassium: 3.5 mmol/L (ref 3.5–5.1)
Sodium: 139 mmol/L (ref 135–145)
Total Bilirubin: 0.5 mg/dL (ref 0.3–1.2)
Total Protein: 6.9 g/dL (ref 6.5–8.1)

## 2020-08-16 LAB — CBC WITH DIFFERENTIAL/PLATELET
Abs Immature Granulocytes: 0.02 10*3/uL (ref 0.00–0.07)
Basophils Absolute: 0 10*3/uL (ref 0.0–0.1)
Basophils Relative: 0 %
Eosinophils Absolute: 0.1 10*3/uL (ref 0.0–0.5)
Eosinophils Relative: 2 %
HCT: 36.4 % — ABNORMAL LOW (ref 39.0–52.0)
Hemoglobin: 12.4 g/dL — ABNORMAL LOW (ref 13.0–17.0)
Immature Granulocytes: 1 %
Lymphocytes Relative: 48 %
Lymphs Abs: 1.4 10*3/uL (ref 0.7–4.0)
MCH: 32.3 pg (ref 26.0–34.0)
MCHC: 34.1 g/dL (ref 30.0–36.0)
MCV: 94.8 fL (ref 80.0–100.0)
Monocytes Absolute: 0.5 10*3/uL (ref 0.1–1.0)
Monocytes Relative: 15 %
Neutro Abs: 1 10*3/uL — ABNORMAL LOW (ref 1.7–7.7)
Neutrophils Relative %: 34 %
Platelets: 157 10*3/uL (ref 150–400)
RBC: 3.84 MIL/uL — ABNORMAL LOW (ref 4.22–5.81)
RDW: 17.6 % — ABNORMAL HIGH (ref 11.5–15.5)
WBC: 3 10*3/uL — ABNORMAL LOW (ref 4.0–10.5)
nRBC: 0 % (ref 0.0–0.2)

## 2020-08-16 LAB — TSH: TSH: 23.833 u[IU]/mL — ABNORMAL HIGH (ref 0.320–4.118)

## 2020-08-16 MED ORDER — PALONOSETRON HCL INJECTION 0.25 MG/5ML
INTRAVENOUS | Status: AC
  Filled 2020-08-16: qty 5

## 2020-08-16 MED ORDER — SODIUM CHLORIDE 0.9% FLUSH
10.0000 mL | INTRAVENOUS | Status: DC | PRN
  Filled 2020-08-16: qty 10

## 2020-08-16 MED ORDER — SODIUM CHLORIDE 0.9 % IV SOLN
Freq: Once | INTRAVENOUS | Status: AC
  Filled 2020-08-16: qty 250

## 2020-08-16 MED ORDER — SODIUM CHLORIDE 0.9 % IV SOLN
800.0000 mg/m2/d | INTRAVENOUS | Status: DC
  Administered 2020-08-16: 6700 mg via INTRAVENOUS
  Filled 2020-08-16: qty 134

## 2020-08-16 MED ORDER — SODIUM CHLORIDE 0.9 % IV SOLN
200.0000 mg | Freq: Once | INTRAVENOUS | Status: AC
  Administered 2020-08-16: 200 mg via INTRAVENOUS
  Filled 2020-08-16: qty 8

## 2020-08-16 MED ORDER — SODIUM CHLORIDE 0.9 % IV SOLN
150.0000 mg | Freq: Once | INTRAVENOUS | Status: AC
  Administered 2020-08-16: 150 mg via INTRAVENOUS
  Filled 2020-08-16: qty 150

## 2020-08-16 MED ORDER — ONDANSETRON HCL 8 MG PO TABS: 8.0000 mg | ORAL_TABLET | Freq: Three times a day (TID) | ORAL | 1 refills | Status: DC | PRN

## 2020-08-16 MED ORDER — HEPARIN SOD (PORK) LOCK FLUSH 100 UNIT/ML IV SOLN
500.0000 [IU] | Freq: Once | INTRAVENOUS | Status: DC | PRN
  Filled 2020-08-16: qty 5

## 2020-08-16 MED ORDER — SODIUM CHLORIDE 0.9 % IV SOLN
590.0000 mg | Freq: Once | INTRAVENOUS | Status: AC
  Administered 2020-08-16: 590 mg via INTRAVENOUS
  Filled 2020-08-16: qty 59

## 2020-08-16 MED ORDER — SODIUM CHLORIDE 0.9 % IV SOLN
10.0000 mg | Freq: Once | INTRAVENOUS | Status: AC
  Administered 2020-08-16: 10 mg via INTRAVENOUS
  Filled 2020-08-16: qty 10

## 2020-08-16 MED ORDER — PALONOSETRON HCL INJECTION 0.25 MG/5ML
0.2500 mg | Freq: Once | INTRAVENOUS | Status: AC
  Administered 2020-08-16: 0.25 mg via INTRAVENOUS

## 2020-08-16 MED ORDER — LORAZEPAM 1 MG PO TABS: 1.0000 mg | ORAL_TABLET | Freq: Three times a day (TID) | ORAL | 0 refills | Status: DC | PRN

## 2020-08-16 NOTE — Progress Notes (Signed)
[place] OFFICE PROGRESS NOTE  Patient Care Team: Patient, No Pcp Per as PCP - General (General Practice) [name], MD as Consulting Physician (Hematology and Oncology) Eppie Gibson, MD as Attending Physician (Radiation Oncology) Jodi Marble, MD as Consulting Physician (Otolaryngology) Philomena Doheny, MD as Referring Physician (Plastic Surgery) Irene Shipper, MD as Consulting Physician (Gastroenterology) System, Provider Not In  ASSESSMENT & PLAN:  Malignant neoplasm of head and neck [facility]) He tolerated recent treatment poorly due to nausea and constipation The neck lesion is visibly smaller Due to persistent and progressive pancytopenia, I plan to reduce the dose of carboplatin It would continue aggressive supportive IV fluids after each dose of treatment I recommend we proceed for total 6 cycles of treatment before repeating imaging study  Cancer associated pain The dose of oxycodone was recently increased to 15 mg to use as needed He can continue the same dose and to take additional ibuprofen as needed  Nausea and vomiting He has chronic nausea He will continue Zofran and lorazepam as needed He will come here for IV fluid support as well I am hopeful with mild dose reduction of carboplatin, he will experience less nausea  Pancytopenia, acquired ([place]) This is due to chemotherapy He is not symptomatic I plan to reduce the dose of carboplatin a little bit If he develop problems down the road, we will consider G-CSF support   No orders of the defined types were placed in this encounter.   All questions were answered. The patient knows to call the clinic with any problems, questions or concerns. The total time spent in the appointment was 30 minutes encounter with patients including review of chart and various tests results, discussions about plan of care and coordination of care plan   [name], MD 08/16/2020 12:39 PM  INTERVAL HISTORY: Please  see below for problem oriented charting. 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 Pan-endoscopy elsewhere showed cancer from nasopharyngeal space.   01/04/2007 - 02/20/2007 Chemotherapy   He received 2 cycles of cisplatin and 5FU followed by concurrent chemo with weekly cisplatin and radiation. He only received 2 doses of chemo due to severe mucositis, nausea and weight loss.   04/05/2007 - 08/04/2007 Chemotherapy   He received 4 more courses of cisplatin with 5FU and had complete response   07/05/2009 Procedure   Fine-needle aspirate of the right level II lymph nodes come from recurrent metastatic disease. Repeat endoscopy and CT scan show no evidence of disease elsewhere.   07/08/2009 - 12/02/2009 Chemotherapy   He was given 6 cycles of carboplatin, 5-FU and docetaxel   12/03/2009 Surgery   He has surgery to the residual lymph node on the right neck which showed no evidence of disease.  02/22/2012 Imaging   Repeat imaging study showed large recurrent mass. He was referred elsewhere for further treatment.   05/03/2012 Surgery   He underwent left upper lobectomy.   04/29/2013 Imaging   PEt scan showed lesion on right level II B and lower lung was abnormal   06/03/2013  - 02/02/2014 Chemotherapy   He had 6 cycles of chemotherapy when he was found to have recurrence of cancer and had received oxaliplatin and capecitabine   06/07/2014 Imaging   PET CT scan showed persistent disease in the right neck lymph nodes and left lung   06/29/2014 Procedure   Accession: [id number] repeat LUL biopsy confirmed metastatic cancer   07/18/2014 - 07/31/2014 Radiation Therapy   He received palliative radiation therapy to the lungs   10/10/2014 Imaging   CT scan of the chest, abdomen and pelvis show regression in the size of the lung nodule in the left upper lobe and stable pulmonary nodules   01/24/2015 Imaging   CT scan showed stable disease in neck and lung   06/19/2015 Imaging   CT scan of the neck and the chest show possible mild progression of the nodule in the right side of the neck.   06/25/2015 Imaging   PET scan confirmed disease recurrence in the neck   07/07/2015 Imaging   He had MRI neck at [facility]   09/03/2015 - 08/26/2018 Chemotherapy   He received palliative chemo with Nivolumab   10/29/2015 Imaging   PET CT showed positive response to Rx   02/28/2016 Imaging   Ct abdomen showed abnormal thinkening in his stomach   03/03/2016 Imaging   CT: Right sternocleidomastoid muscle metastasis appears less distinct but otherwise not significantly changed in size or configuration since 06/19/2015.2. Left level 3 lymph node which was hypermetabolic by PET-CT in January 2017 appears slightly smaller   04/01/2016 Imaging   CT cervical spine showed no acute fracture or traumatic malalignment in the cervical spine   04/22/2016 Procedure   Port-a-cath placed.   06/16/2016 Imaging   Ct neck showed right sternocleidomastoid muscle metastasis is further decreased in conspicuity since May, and has mildly decreased in size since September 2016. Continued stability of sub-centimeter left cervical lymph nodes. No new or progressive metastatic disease in the neck.   06/16/2016 Imaging    CT chest showed stable masslike radiation fibrosis in the left upper lobe. Stable subcentimeter pulmonary nodules in the bilateral lower lobes. No new or progressive metastatic disease in the chest. Nonobstructing left renal stone.   10/13/2016 Imaging   Ct neck showed unchanged right sternocleidomastoid muscle metastasis. Unchanged subcentimeter left cervical lymph nodes. No evidence of new or progressive metastatic disease in the neck.   10/13/2016 Imaging   CT chest showed tiny hypervascular foci in the liver, not definitely seen on prior imaging of 06/16/2016 and 02/28/2016. Abdomen MRI without and with contrast recommended to further evaluate as metastatic disease is a concern. 2. Stable appearance of post treatment changes left upper lung and scattered tiny bilateral pulmonary nodules.   02/11/2017 Imaging   Ct neck: Lymph node mass right posterior neck appears improved from the prior study. Small posterior lymph nodes on the left unchanged. Occluded right jugular vein unchanged.   02/11/2017 Imaging   1. Similar appearance of postsurgical and radiation changes in the left upper lobe. 2. Similar bilateral pulmonary nodules. 3. No thoracic adenopathy. 4. Subtle foci of post-contrast enhancement within the liver are suboptimally characterized on this nondedicated study. Likely similar. These could either be  re-evaluated at followup or more entirely characterized with abdominal MRI. 5. Left nephrolithiasis.   05/19/2017 Imaging   Matted lymph node mass right posterior neck appears larger in the recent CT. Accurate measurements difficult due to infiltrating tumor margins and infiltration of the muscle. Right jugular vein again appears occluded or resected. Small left posterior lymph nodes stable. Left upper lobe airspace density stable and similar to the prior CT   06/03/2017 PET scan   1. Hypermetabolic ill-defined right level IIb lymph node, about 1.3 cm in diameter with maximum SUV 9.5  (formerly 8.1). Appearance suspicious for residual/recurrent malignancy. No worrisome left-sided lesion. 2. Left suprahilar indistinct opacity demonstrates no worrisome hypermetabolic activity. The 5 mm left lower lobe pulmonary nodule is stable and not currently hypermetabolic although below sensitive PET-CT size thresholds. 3. Other imaging findings of potential clinical significance: Bilateral nonobstructive nephrolithiasis. Chronic bilateral maxillary sinusitis.   05/11/2018 PET scan   1. Continued chronic accentuated metabolic activity in the vicinity of right level IIB and the adjacent right sternocleidomastoid muscle, with ill definition of surrounding tissue planes. Maximum SUV is currently 8.1, formerly 9.5. Accentuated metabolic activity is been present in this vicinity back through 06/25/2015, and there was also some low-level activity in this vicinity on 06/07/2014. Some of this may be from scarring and local muscular activity although clearly a component of residual tumor is difficult to exclude given the focally high activity. 2. Other imaging findings of potential clinical significance: Chronic bilateral maxillary sinusitis. Chronic scarring in the left upper lobe. Chronically stable 5 mm left lower lobe nodule is considered benign. Nonobstructive left nephrolithiasis.   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 COMMENT:No significant keratinization is identified. Other basaloid carcinomas are in the differential diagnosis. No cell block material is available for further testing.   09/12/2018 Procedure   He underwent fine Needle Aspiration   10/04/2018 PET scan   1. Significant progression of local recurrence laterally in the mid right neck with an enlarging, increasingly hypermetabolic soft tissue mass. This involves the right  sternocleidomastoid muscle. 2. Small lymph nodes in the right axilla are increasingly hypermetabolic. These are nonspecific and potentially reactive, although could reflect a small metastases. Small hypermetabolic nodule in the left suprasternal notch is unchanged. 3. No other evidence of metastatic disease.    10/07/2018 - 12/23/2018 Chemotherapy   The patient had cisplatin plus gemzar   12/07/2018 Imaging   1. Decreased size of lateral right neck mass. 2. Unchanged soft tissue nodule in the suprasternal notch. 3. No evidence of new metastatic disease in the neck.    05/30/2019 Imaging   CT neck No clear change or progression compared to the study of March. Overall measurements of the right lateral neck mass are similar, approximately 3 x 1.8 cm. See above discussion. One could argue that there is slight increase in lateral bulging, possibly with an increase in contrast enhancement, towards the inferior margin. This is of questionable validity but could possibly represent some progression or inflammatory change. Other findings in the region are stable.   07/20/2019 - 09/15/2019 Chemotherapy   The patient had dexamethasone (DECADRON) 4 MG tablet, 1 of 1 cycle, Start date: --, End date: -- palonosetron (ALOXI) injection 0.25 mg, 0.25 mg, Intravenous,  Once, 3 of 4 cycles Administration: 0.25 mg (07/20/2019), 0.25 mg (08/10/2019), 0.25 mg (09/08/2019) CISplatin (PLATINOL) 84 mg in sodium chloride 0.9 % 250  mL chemo infusion, 40 mg/m2 = 84 mg (80 % of original dose 50 mg/m2), Intravenous,  Once, 3 of 4 cycles Dose modification: 40 mg/m2 (80 % of original dose 50 mg/m2, Cycle 1, Reason: Dose Not Tolerated) Administration: 84 mg (07/20/2019), 84 mg (08/10/2019), 83 mg (09/08/2019) gemcitabine (GEMZAR) 1,600 mg in sodium chloride 0.9 % 250 mL chemo infusion, 1,672 mg (80 % of original dose 1,000 mg/m2), Intravenous,  Once, 3 of 4 cycles Dose modification: 800 mg/m2 (80 % of original dose 1,000 mg/m2,  Cycle 1, Reason: Provider Judgment) Administration: 1,600 mg (07/20/2019), 1,600 mg (07/27/2019), 1,600 mg (08/10/2019), 1,600 mg (08/17/2019), 1,600 mg (09/08/2019), 1,672 mg (09/15/2019) ondansetron (ZOFRAN) 8 mg, dexamethasone (DECADRON) 10 mg in sodium chloride 0.9 % 50 mL IVPB, , Intravenous,  Once, 3 of 4 cycles Administration:  (09/15/2019) fosaprepitant (EMEND) 150 mg, dexamethasone (DECADRON) 12 mg in sodium chloride 0.9 % 145 mL IVPB, , Intravenous,  Once, 3 of 4 cycles Administration:  (07/20/2019),  (08/10/2019),  (09/08/2019)  for chemotherapy treatment.    10/02/2019 Imaging   CT neck As compared to 05/30/2019, no significant interval change in size of an ill-defined mass within the right lateral neck, again measuring 3.3 x 1.8 cm in transaxial dimensions.   Unchanged mildly enlarged left level I lymph node measuring 1.1 cm in short axis.   Unchanged node or nodule at the thoracic inlet, measuring 1.3 x 0.8 cm.   Please refer to concurrently performed chest CT for a description of findings below the level of the thoracic inlet.     10/02/2019 Imaging   CT chest 1. No new or progressive findings in the chest to suggest metastatic disease. 2. Bilateral subcentimeter solid pulmonary nodules are stable since 2018. 3. Hyperdense 1.1 cm anterior liver focus, not clearly visualized on prior studies. Suggest MRI abdomen without and with IV contrast for further characterization.   10/20/2019 - 12/29/2019 Chemotherapy   The patient had ondansetron (ZOFRAN) injection 8 mg, 8 mg (100 % of original dose 8 mg), Intravenous,  Once, 2 of 5 cycles Dose modification: 8 mg (original dose 8 mg, Cycle 2) Administration: 8 mg (11/17/2019), 8 mg (12/15/2019), 8 mg (12/29/2019) gemcitabine (GEMZAR) 2,000 mg in sodium chloride 0.9 % 250 mL chemo infusion, 2,090 mg, Intravenous,  Once, 3 of 6 cycles Administration: 2,000 mg (10/20/2019), 2,000 mg (11/03/2019), 2,000 mg (11/17/2019), 2,000 mg (12/15/2019), 2,000  mg (12/29/2019)  for chemotherapy treatment.    06/12/2020 Imaging   1. Enlarging superficial, exophytic component of the chronic right sternocleidomastoid muscle mass. See series 6, image 55. 2. Elsewhere stable CT appearance of the Neck.   06/12/2020 Imaging   Post treatment scarring in the left hemithorax, stable. No evidence recurrent or metastatic disease   06/14/2020 -  Chemotherapy   The patient had palonosetron (ALOXI) injection 0.25 mg, 0.25 mg, Intravenous,  Once, 4 of 5 cycles Administration: 0.25 mg (06/14/2020), 0.25 mg (07/05/2020), 0.25 mg (07/26/2020) fosaprepitant (EMEND) 150 mg in sodium chloride 0.9 % 145 mL IVPB, 150 mg, Intravenous,  Once, 4 of 5 cycles Administration: 150 mg (06/14/2020), 150 mg (07/05/2020), 150 mg (07/26/2020) pembrolizumab (KEYTRUDA) 200 mg in sodium chloride 0.9 % 50 mL chemo infusion, 200 mg (100 % of original dose 200 mg), Intravenous, Once, 4 of 5 cycles Dose modification: 200 mg (original dose 200 mg, Cycle 1, Reason: Provider Judgment) Administration: 200 mg (06/14/2020), 200 mg (07/05/2020), 200 mg (07/26/2020) fluorouracil (ADRUCIL) 6,750 mg in sodium chloride 0.9 % 115 mL chemo infusion, 800  mg/m2/day = 6,750 mg (100 % of original dose 800 mg/m2/day), Intravenous, 4D (96 hours ), 4 of 5 cycles Dose modification: 800 mg/m2/day (original dose 800 mg/m2/day, Cycle 1, Reason: Provider Judgment) Administration: 6,750 mg (06/14/2020), 6,750 mg (07/05/2020), 6,700 mg (07/26/2020) CARBOplatin (PARAPLATIN) 750 mg in sodium chloride 0.9 % 250 mL chemo infusion, 750 mg (100 % of original dose 750 mg), Intravenous,  Once, 4 of 5 cycles Dose modification:   (original dose 750 mg, Cycle 1) Administration: 750 mg (06/14/2020), 730 mg (07/05/2020), 730 mg (07/26/2020)  for chemotherapy treatment.      REVIEW OF SYSTEMS:   Constitutional: Denies fevers, chills or abnormal weight loss Eyes: Denies blurriness of vision Ears, nose, mouth, throat, and face: Denies  mucositis or sore throat Respiratory: Denies cough, dyspnea or wheezes Cardiovascular: Denies palpitation, chest discomfort or lower extremity swelling Skin: Denies abnormal skin rashes Lymphatics: Denies new lymphadenopathy or easy bruising Neurological:Denies numbness, tingling or new weaknesses Behavioral/Psych: Mood is stable, no new changes  All other systems were reviewed with the patient and are negative.  I have reviewed the past medical history, past surgical history, social history and family history with the patient and they are unchanged from previous note.  ALLERGIES:  is allergic to phenergan [promethazine hcl], heparin, and clindamycin.  MEDICATIONS:  Current Outpatient Medications  Medication Sig Dispense Refill  . atorvastatin (LIPITOR) 40 MG tablet TK 1 T PO  D    . cetirizine (ZYRTEC) 10 MG tablet TAKE 1 TABLET(10 MG) BY MOUTH DAILY 30 tablet 1  . diclofenac Sodium (VOLTAREN) 1 % GEL Apply topically.    Marland Kitchen levothyroxine (SYNTHROID) 137 MCG tablet Take 1 tablet (137 mcg total) by mouth daily before breakfast. 90 tablet 9  . LORazepam (ATIVAN) 1 MG tablet Take 1 tablet (1 mg total) by mouth every 8 (eight) hours as needed for anxiety. 30 tablet 0  . omeprazole (PRILOSEC) 40 MG capsule Take 1 capsule (40 mg total) by mouth daily. 30 capsule 9  . ondansetron (ZOFRAN) 8 MG tablet Take 1 tablet (8 mg total) by mouth every 8 (eight) hours as needed for nausea or vomiting. 90 tablet 1  . oxyCODONE (ROXICODONE) 15 MG immediate release tablet Take 1 tablet (15 mg total) by mouth every 4 (four) hours as needed. 90 tablet 0  . PARoxetine (PAXIL) 20 MG tablet Take 1 tablet (20 mg total) by mouth daily. 30 tablet 5  . polyethylene glycol (MIRALAX) packet Take 17 g by mouth daily. (Patient taking differently: Take 17 g by mouth daily as needed for mild constipation or moderate constipation. ) 14 each 3  . senna-docusate (SENOKOT-S) 8.6-50 MG tablet Take 2 tablets by mouth 3 (three) times  daily. 90 tablet 0  . Vitamin D, Ergocalciferol, (DRISDOL) 1.25 MG (50000 UT) CAPS capsule TK 1 C PO WEEKLY     No current facility-administered medications for this visit.   Facility-Administered Medications Ordered in Other Visits  Medication Dose Route Frequency Provider Last Rate Last Admin  . anticoagulant sodium citrate solution 5 mL  5 mL Intracatheter Once Alvy Bimler, Janashia Parco, MD      . anticoagulant sodium citrate solution 5 mL  5 mL Intracatheter Once Alvy Bimler, Elbert Spickler, MD      . CARBOplatin (PARAPLATIN) 590 mg in sodium chloride 0.9 % 250 mL chemo infusion  590 mg Intravenous Once Alvy Bimler, Tanesha Arambula, MD      . dexamethasone (DECADRON) 10 mg in sodium chloride 0.9 % 50 mL IVPB  10 mg Intravenous Once  [name], MD 204 mL/hr at 08/16/20 1226 10 mg at 08/16/20 1226  . fluorouracil (ADRUCIL) 6,700 mg in sodium chloride 0.9 % 116 mL chemo infusion  800 mg/m2/day (Order-Specific) Intravenous 4 days Alvy Bimler, Thomasene Dubow, MD      . fosaprepitant (EMEND) 150 mg in sodium chloride 0.9 % 145 mL IVPB  150 mg Intravenous Once Alvy Bimler, Jaquay Posthumus, MD      . heparin lock flush 100 unit/mL  500 Units Intracatheter Once PRN Alvy Bimler, Quentina Fronek, MD      . pembrolizumab (KEYTRUDA) 200 mg in sodium chloride 0.9 % 50 mL chemo infusion  200 mg Intravenous Once Alvy Bimler, Deejay Koppelman, MD      . sodium chloride flush (NS) 0.9 % injection 10 mL  10 mL Intracatheter PRN Alvy Bimler, Nira Visscher, MD        PHYSICAL EXAMINATION: ECOG PERFORMANCE STATUS: 1 - Symptomatic but completely ambulatory  Vitals:   08/16/20 1134  BP: 129/83  Pulse: 86  Resp: 18  Temp: 97.6 F (36.4 C)  SpO2: 100%   Filed Weights   08/16/20 1134  Weight: 197 lb 9.6 oz (89.6 kg)    GENERAL:alert, no distress and comfortable SKIN: The neck mass is reduced in size NEURO: alert & oriented x 3 with fluent speech, no focal motor/sensory deficits  LABORATORY DATA:  I have reviewed the data as listed    Component Value Date/Time   NA 139 08/16/2020 1115   NA 139 09/22/2017 0829   K 3.5  08/16/2020 1115   K 3.5 09/22/2017 0829   CL 102 08/16/2020 1115   CO2 28 08/16/2020 1115   CO2 26 09/22/2017 0829   GLUCOSE 151 (H) 08/16/2020 1115   GLUCOSE 133 09/22/2017 0829   BUN 12 08/16/2020 1115   BUN 14.1 09/22/2017 0829   CREATININE 1.08 08/16/2020 1115   CREATININE 0.93 02/06/2019 1215   CREATININE 0.9 09/22/2017 0829   CALCIUM 9.1 08/16/2020 1115   CALCIUM 9.1 09/22/2017 0829   PROT 6.9 08/16/2020 1115   PROT 6.8 09/22/2017 0829   ALBUMIN 4.0 08/16/2020 1115   ALBUMIN 4.1 09/22/2017 0829   AST 26 08/16/2020 1115   AST 31 02/06/2019 1215   AST 22 09/22/2017 0829   ALT 31 08/16/2020 1115   ALT 48 (H) 02/06/2019 1215   ALT 30 09/22/2017 0829   ALKPHOS 64 08/16/2020 1115   ALKPHOS 55 09/22/2017 0829   BILITOT 0.5 08/16/2020 1115   BILITOT 0.2 (L) 02/06/2019 1215   BILITOT 0.35 09/22/2017 0829   GFRNONAA >60 08/16/2020 1115   GFRNONAA >60 02/06/2019 1215   GFRAA >60 07/05/2020 0845   GFRAA >60 02/06/2019 1215    No results found for: SPEP, UPEP  Lab Results  Component Value Date   WBC 3.0 (L) 08/16/2020   NEUTROABS 1.0 (L) 08/16/2020   HGB 12.4 (L) 08/16/2020   HCT 36.4 (L) 08/16/2020   MCV 94.8 08/16/2020   PLT 157 08/16/2020      Chemistry      Component Value Date/Time   NA 139 08/16/2020 1115   NA 139 09/22/2017 0829   K 3.5 08/16/2020 1115   K 3.5 09/22/2017 0829   CL 102 08/16/2020 1115   CO2 28 08/16/2020 1115   CO2 26 09/22/2017 0829   BUN 12 08/16/2020 1115   BUN 14.1 09/22/2017 0829   CREATININE 1.08 08/16/2020 1115   CREATININE 0.93 02/06/2019 1215   CREATININE 0.9 09/22/2017 0829      Component Value Date/Time   CALCIUM 9.1  08/16/2020 1115   CALCIUM 9.1 09/22/2017 0829   ALKPHOS 64 08/16/2020 1115   ALKPHOS 55 09/22/2017 0829   AST 26 08/16/2020 1115   AST 31 02/06/2019 1215   AST 22 09/22/2017 0829   ALT 31 08/16/2020 1115   ALT 48 (H) 02/06/2019 1215   ALT 30 09/22/2017 0829   BILITOT 0.5 08/16/2020 1115   BILITOT 0.2  (L) 02/06/2019 1215   BILITOT 0.35 09/22/2017 2778

## 2020-08-16 NOTE — Assessment & Plan Note (Signed)
The dose of oxycodone was recently increased to 15 mg to use as needed He can continue the same dose and to take additional ibuprofen as needed

## 2020-08-16 NOTE — Assessment & Plan Note (Signed)
This is due to chemotherapy He is not symptomatic I plan to reduce the dose of carboplatin a little bit If he develop problems down the road, we will consider G-CSF support

## 2020-08-16 NOTE — Progress Notes (Signed)
During treatment today, patient requested aromatherapy for nausea. This RN provided patient with peppermint and orange ginger inhalers. Patient educated on use, and he verbalized understanding. Patient states he will use at a later time.

## 2020-08-16 NOTE — Patient Instructions (Signed)

## 2020-08-16 NOTE — Assessment & Plan Note (Signed)
He has chronic nausea He will continue Zofran and lorazepam as needed He will come here for IV fluid support as well I am hopeful with mild dose reduction of carboplatin, he will experience less nausea

## 2020-08-16 NOTE — Patient Instructions (Signed)
[place] Discharge Instructions for Patients Receiving Chemotherapy  Today you received the following chemotherapy agents: pembrolizumab (keytruda)/carboplatin/fluorouracil (5FU).  To help prevent nausea and vomiting after your treatment, we encourage you to take your nausea medication as directed.   If you develop nausea and vomiting that is not controlled by your nausea medication, call the clinic.   BELOW ARE SYMPTOMS THAT SHOULD BE REPORTED IMMEDIATELY:  *FEVER GREATER THAN 100.5 F  *CHILLS WITH OR WITHOUT FEVER  NAUSEA AND VOMITING THAT IS NOT CONTROLLED WITH YOUR NAUSEA MEDICATION  *UNUSUAL SHORTNESS OF BREATH  *UNUSUAL BRUISING OR BLEEDING  TENDERNESS IN MOUTH AND THROAT WITH OR WITHOUT PRESENCE OF ULCERS  *URINARY PROBLEMS  *BOWEL PROBLEMS  UNUSUAL RASH Items with * indicate a potential emergency and should be followed up as soon as possible.  Feel free to call the clinic should you have any questions or concerns. The clinic phone number is [phone].  Please show the [place] at check-in to the Emergency Department and triage nurse.  Pembrolizumab injection What is this medicine? PEMBROLIZUMAB (pem broe liz ue mab) is a monoclonal antibody. It is used to treat certain types of cancer. This medicine may be used for other purposes; ask your health care provider or pharmacist if you have questions. COMMON BRAND NAME(S): Keytruda What should I tell my health care provider before I take this medicine? They need to know if you have any of these conditions:  diabetes  immune system problems  inflammatory bowel disease  liver disease  lung or breathing disease  lupus  received or scheduled to receive an organ transplant or a stem-cell transplant that uses donor stem cells  an unusual or allergic reaction to pembrolizumab, other medicines, foods, dyes, or preservatives  pregnant or trying to get pregnant  breast-feeding How  should I use this medicine? This medicine is for infusion into a vein. It is given by a health care professional in a hospital or clinic setting. A special MedGuide will be given to you before each treatment. Be sure to read this information carefully each time. Talk to your pediatrician regarding the use of this medicine in children. While this drug may be prescribed for children as young as 6 months for selected conditions, precautions do apply. Overdosage: If you think you have taken too much of this medicine contact a poison control center or emergency room at once. NOTE: This medicine is only for you. Do not share this medicine with others. What if I miss a dose? It is important not to miss your dose. Call your doctor or health care professional if you are unable to keep an appointment. What may interact with this medicine? Interactions have not been studied. Give your health care provider a list of all the medicines, herbs, non-prescription drugs, or dietary supplements you use. Also tell them if you smoke, drink alcohol, or use illegal drugs. Some items may interact with your medicine. This list may not describe all possible interactions. Give your health care provider a list of all the medicines, herbs, non-prescription drugs, or dietary supplements you use. Also tell them if you smoke, drink alcohol, or use illegal drugs. Some items may interact with your medicine. What should I watch for while using this medicine? Your condition will be monitored carefully while you are receiving this medicine. You may need blood work done while you are taking this medicine. Do not become pregnant while taking this medicine or for 4 months after stopping it.  Women should inform their doctor if they wish to become pregnant or think they might be pregnant. There is a potential for serious side effects to an unborn child. Talk to your health care professional or pharmacist for more information. Do not  breast-feed an infant while taking this medicine or for 4 months after the last dose. What side effects may I notice from receiving this medicine? Side effects that you should report to your doctor or health care professional as soon as possible:  allergic reactions like skin rash, itching or hives, swelling of the face, lips, or tongue  bloody or black, tarry  breathing problems  changes in vision  chest pain  chills  confusion  constipation  cough  diarrhea  dizziness or feeling faint or lightheaded  fast or irregular heartbeat  fever  flushing  joint pain  low blood counts - this medicine may decrease the number of white blood cells, red blood cells and platelets. You may be at increased risk for infections and bleeding.  muscle pain  muscle weakness  pain, tingling, numbness in the hands or feet  persistent headache  redness, blistering, peeling or loosening of the skin, including inside the mouth  signs and symptoms of high blood sugar such as dizziness; dry mouth; dry skin; fruity breath; nausea; stomach pain; increased hunger or thirst; increased urination  signs and symptoms of kidney injury like trouble passing urine or change in the amount of urine  signs and symptoms of liver injury like dark urine, light-colored stools, loss of appetite, nausea, right upper belly pain, yellowing of the eyes or skin  sweating  swollen lymph nodes  weight loss Side effects that usually do not require medical attention (report to your doctor or health care professional if they continue or are bothersome):  decreased appetite  hair loss  muscle pain  tiredness This list may not describe all possible side effects. Call your doctor for medical advice about side effects. You may report side effects to FDA at 1-800-FDA-1088. Where should I keep my medicine? This drug is given in a hospital or clinic and will not be stored at home. NOTE: This sheet is a summary.  It may not cover all possible information. If you have questions about this medicine, talk to your doctor, pharmacist, or health care provider.  2020 Elsevier/Gold Standard (2019-07-28 18:07:58)  Carboplatin injection What is this medicine? CARBOPLATIN (KAR boe pla tin) is a chemotherapy drug. It targets fast dividing cells, like cancer cells, and causes these cells to die. This medicine is used to treat ovarian cancer and many other cancers. This medicine may be used for other purposes; ask your health care provider or pharmacist if you have questions. COMMON BRAND NAME(S): Paraplatin What should I tell my health care provider before I take this medicine? They need to know if you have any of these conditions:  blood disorders  hearing problems  kidney disease  recent or ongoing radiation therapy  an unusual or allergic reaction to carboplatin, cisplatin, other chemotherapy, other medicines, foods, dyes, or preservatives  pregnant or trying to get pregnant  breast-feeding How should I use this medicine? This drug is usually given as an infusion into a vein. It is administered in a hospital or clinic by a specially trained health care professional. Talk to your pediatrician regarding the use of this medicine in children. Special care may be needed. Overdosage: If you think you have taken too much of this medicine contact a poison control center  or emergency room at once. NOTE: This medicine is only for you. Do not share this medicine with others. What if I miss a dose? It is important not to miss a dose. Call your doctor or health care professional if you are unable to keep an appointment. What may interact with this medicine?  medicines for seizures  medicines to increase blood counts like filgrastim, pegfilgrastim, sargramostim  some antibiotics like amikacin, gentamicin, neomycin, streptomycin, tobramycin  vaccines Talk to your doctor or health care professional before  taking any of these medicines:  acetaminophen  aspirin  ibuprofen  ketoprofen  naproxen This list may not describe all possible interactions. Give your health care provider a list of all the medicines, herbs, non-prescription drugs, or dietary supplements you use. Also tell them if you smoke, drink alcohol, or use illegal drugs. Some items may interact with your medicine. What should I watch for while using this medicine? Your condition will be monitored carefully while you are receiving this medicine. You will need important blood work done while you are taking this medicine. This drug may make you feel generally unwell. This is not uncommon, as chemotherapy can affect healthy cells as well as cancer cells. Report any side effects. Continue your course of treatment even though you feel ill unless your doctor tells you to stop. In some cases, you may be given additional medicines to help with side effects. Follow all directions for their use. Call your doctor or health care professional for advice if you get a fever, chills or sore throat, or other symptoms of a cold or flu. Do not treat yourself. This drug decreases your body's ability to fight infections. Try to avoid being around people who are sick. This medicine may increase your risk to bruise or bleed. Call your doctor or health care professional if you notice any unusual bleeding. Be careful brushing and flossing your teeth or using a toothpick because you may get an infection or bleed more easily. If you have any dental work done, tell your dentist you are receiving this medicine. Avoid taking products that contain aspirin, acetaminophen, ibuprofen, naproxen, or ketoprofen unless instructed by your doctor. These medicines may hide a fever. Do not become pregnant while taking this medicine. Women should inform their doctor if they wish to become pregnant or think they might be pregnant. There is a potential for serious side effects to an  unborn child. Talk to your health care professional or pharmacist for more information. Do not breast-feed an infant while taking this medicine. What side effects may I notice from receiving this medicine? Side effects that you should report to your doctor or health care professional as soon as possible:  allergic reactions like skin rash, itching or hives, swelling of the face, lips, or tongue  signs of infection - fever or chills, cough, sore throat, pain or difficulty passing urine  signs of decreased platelets or bleeding - bruising, pinpoint red spots on the skin, black, tarry stools, nosebleeds  signs of decreased red blood cells - unusually weak or tired, fainting spells, lightheadedness  breathing problems  changes in hearing  changes in vision  chest pain  high blood pressure  low blood counts - This drug may decrease the number of white blood cells, red blood cells and platelets. You may be at increased risk for infections and bleeding.  nausea and vomiting  pain, swelling, redness or irritation at the injection site  pain, tingling, numbness in the hands or feet  problems with balance, talking, walking  trouble passing urine or change in the amount of urine Side effects that usually do not require medical attention (report to your doctor or health care professional if they continue or are bothersome):  hair loss  loss of appetite  metallic taste in the mouth or changes in taste This list may not describe all possible side effects. Call your doctor for medical advice about side effects. You may report side effects to FDA at 1-800-FDA-1088. Where should I keep my medicine? This drug is given in a hospital or clinic and will not be stored at home. NOTE: This sheet is a summary. It may not cover all possible information. If you have questions about this medicine, talk to your doctor, pharmacist, or health care provider.  2020 Elsevier/Gold Standard (2007-12-27  14:38:05)  Fluorouracil, 5-FU injection What is this medicine? FLUOROURACIL, 5-FU (flure oh YOOR a sil) is a chemotherapy drug. It slows the growth of cancer cells. This medicine is used to treat many types of cancer like breast cancer, colon or rectal cancer, pancreatic cancer, and stomach cancer. This medicine may be used for other purposes; ask your health care provider or pharmacist if you have questions. COMMON BRAND NAME(S): Adrucil What should I tell my health care provider before I take this medicine? They need to know if you have any of these conditions:  blood disorders  dihydropyrimidine dehydrogenase (DPD) deficiency  infection (especially a virus infection such as chickenpox, cold sores, or herpes)  kidney disease  liver disease  malnourished, poor nutrition  recent or ongoing radiation therapy  an unusual or allergic reaction to fluorouracil, other chemotherapy, other medicines, foods, dyes, or preservatives  pregnant or trying to get pregnant  breast-feeding How should I use this medicine? This drug is given as an infusion or injection into a vein. It is administered in a hospital or clinic by a specially trained health care professional. Talk to your pediatrician regarding the use of this medicine in children. Special care may be needed. Overdosage: If you think you have taken too much of this medicine contact a poison control center or emergency room at once. NOTE: This medicine is only for you. Do not share this medicine with others. What if I miss a dose? It is important not to miss your dose. Call your doctor or health care professional if you are unable to keep an appointment. What may interact with this medicine?  allopurinol  cimetidine  dapsone  digoxin  hydroxyurea  leucovorin  levamisole  medicines for seizures like ethotoin, fosphenytoin, phenytoin  medicines to increase blood counts like filgrastim, pegfilgrastim,  sargramostim  medicines that treat or prevent blood clots like warfarin, enoxaparin, and dalteparin  methotrexate  metronidazole  pyrimethamine  some other chemotherapy drugs like busulfan, cisplatin, estramustine, vinblastine  trimethoprim  trimetrexate  vaccines Talk to your doctor or health care professional before taking any of these medicines:  acetaminophen  aspirin  ibuprofen  ketoprofen  naproxen This list may not describe all possible interactions. Give your health care provider a list of all the medicines, herbs, non-prescription drugs, or dietary supplements you use. Also tell them if you smoke, drink alcohol, or use illegal drugs. Some items may interact with your medicine. What should I watch for while using this medicine? Visit your doctor for checks on your progress. This drug may make you feel generally unwell. This is not uncommon, as chemotherapy can affect healthy cells as well as cancer cells. Report any side  effects. Continue your course of treatment even though you feel ill unless your doctor tells you to stop. In some cases, you may be given additional medicines to help with side effects. Follow all directions for their use. Call your doctor or health care professional for advice if you get a fever, chills or sore throat, or other symptoms of a cold or flu. Do not treat yourself. This drug decreases your body's ability to fight infections. Try to avoid being around people who are sick. This medicine may increase your risk to bruise or bleed. Call your doctor or health care professional if you notice any unusual bleeding. Be careful brushing and flossing your teeth or using a toothpick because you may get an infection or bleed more easily. If you have any dental work done, tell your dentist you are receiving this medicine. Avoid taking products that contain aspirin, acetaminophen, ibuprofen, naproxen, or ketoprofen unless instructed by your doctor. These  medicines may hide a fever. Do not become pregnant while taking this medicine. Women should inform their doctor if they wish to become pregnant or think they might be pregnant. There is a potential for serious side effects to an unborn child. Talk to your health care professional or pharmacist for more information. Do not breast-feed an infant while taking this medicine. Men should inform their doctor if they wish to father a child. This medicine may lower sperm counts. Do not treat diarrhea with over the counter products. Contact your doctor if you have diarrhea that lasts more than 2 days or if it is severe and watery. This medicine can make you more sensitive to the sun. Keep out of the sun. If you cannot avoid being in the sun, wear protective clothing and use sunscreen. Do not use sun lamps or tanning beds/booths. What side effects may I notice from receiving this medicine? Side effects that you should report to your doctor or health care professional as soon as possible:  allergic reactions like skin rash, itching or hives, swelling of the face, lips, or tongue  low blood counts - this medicine may decrease the number of white blood cells, red blood cells and platelets. You may be at increased risk for infections and bleeding.  signs of infection - fever or chills, cough, sore throat, pain or difficulty passing urine  signs of decreased platelets or bleeding - bruising, pinpoint red spots on the skin, black, tarry stools, blood in the urine  signs of decreased red blood cells - unusually weak or tired, fainting spells, lightheadedness  breathing problems  changes in vision  chest pain  mouth sores  nausea and vomiting  pain, swelling, redness at site where injected  pain, tingling, numbness in the hands or feet  redness, swelling, or sores on hands or feet  stomach pain  unusual bleeding Side effects that usually do not require medical attention (report to your doctor or  health care professional if they continue or are bothersome):  changes in finger or toe nails  diarrhea  dry or itchy skin  hair loss  headache  loss of appetite  sensitivity of eyes to the light  stomach upset  unusually teary eyes This list may not describe all possible side effects. Call your doctor for medical advice about side effects. You may report side effects to FDA at 1-800-FDA-1088. Where should I keep my medicine? This drug is given in a hospital or clinic and will not be stored at home. NOTE: This sheet is a summary.  It may not cover all possible information. If you have questions about this medicine, talk to your doctor, pharmacist, or health care provider.  2020 Elsevier/Gold Standard (2008-01-25 13:53:16)

## 2020-08-16 NOTE — Assessment & Plan Note (Signed)
He tolerated recent treatment poorly due to nausea and constipation The neck lesion is visibly smaller Due to persistent and progressive pancytopenia, I plan to reduce the dose of carboplatin It would continue aggressive supportive IV fluids after each dose of treatment I recommend we proceed for total 6 cycles of treatment before repeating imaging study

## 2020-08-17 ENCOUNTER — Inpatient Hospital Stay: Payer: Medicaid Other

## 2020-08-17 VITALS — BP 114/62 | HR 70 | Temp 97.6°F | Resp 17

## 2020-08-17 DIAGNOSIS — C760 Malignant neoplasm of head, face and neck: Secondary | ICD-10-CM

## 2020-08-17 DIAGNOSIS — C7800 Secondary malignant neoplasm of unspecified lung: Secondary | ICD-10-CM

## 2020-08-17 DIAGNOSIS — Z95828 Presence of other vascular implants and grafts: Secondary | ICD-10-CM

## 2020-08-17 DIAGNOSIS — Z5111 Encounter for antineoplastic chemotherapy: Secondary | ICD-10-CM | POA: Diagnosis not present

## 2020-08-17 MED ORDER — SODIUM CHLORIDE 0.9 % IV SOLN
Freq: Once | INTRAVENOUS | Status: AC
  Filled 2020-08-17: qty 250

## 2020-08-17 MED ORDER — ONDANSETRON HCL 4 MG/2ML IJ SOLN: 8.0000 mg | Freq: Once | INTRAMUSCULAR | Status: DC

## 2020-08-17 NOTE — Patient Instructions (Signed)

## 2020-08-17 NOTE — Progress Notes (Signed)
Pt discharged in no apparent distress. Pt left ambulatory without assistance. Pt aware of discharge instructions and verbalized understanding and had no further questions.  

## 2020-08-19 ENCOUNTER — Inpatient Hospital Stay: Payer: Medicaid Other

## 2020-08-19 ENCOUNTER — Telehealth: Payer: Self-pay

## 2020-08-19 NOTE — Telephone Encounter (Signed)
Called and left a message appt time changed to 2 pm tomorrow for IV fluids on 11/16. Ask him to call the office back.

## 2020-08-20 ENCOUNTER — Inpatient Hospital Stay: Payer: Medicaid Other

## 2020-08-20 ENCOUNTER — Other Ambulatory Visit: Payer: Self-pay

## 2020-08-20 VITALS — BP 123/64 | HR 82 | Temp 98.5°F | Resp 18

## 2020-08-20 DIAGNOSIS — Z7189 Other specified counseling: Secondary | ICD-10-CM

## 2020-08-20 DIAGNOSIS — Z95828 Presence of other vascular implants and grafts: Secondary | ICD-10-CM

## 2020-08-20 DIAGNOSIS — C760 Malignant neoplasm of head, face and neck: Secondary | ICD-10-CM

## 2020-08-20 DIAGNOSIS — Z5111 Encounter for antineoplastic chemotherapy: Secondary | ICD-10-CM | POA: Diagnosis not present

## 2020-08-20 DIAGNOSIS — C7800 Secondary malignant neoplasm of unspecified lung: Secondary | ICD-10-CM

## 2020-08-20 MED ORDER — SODIUM CHLORIDE 0.9 % IV SOLN
Freq: Once | INTRAVENOUS | Status: AC
  Filled 2020-08-20: qty 250

## 2020-08-20 MED ORDER — ONDANSETRON HCL 4 MG/2ML IJ SOLN
INTRAMUSCULAR | Status: AC
  Filled 2020-08-20: qty 4

## 2020-08-20 MED ORDER — ONDANSETRON HCL 4 MG/2ML IJ SOLN
8.0000 mg | Freq: Once | INTRAMUSCULAR | Status: AC
  Administered 2020-08-20: 8 mg via INTRAVENOUS

## 2020-08-20 MED ORDER — SODIUM CHLORIDE 0.9% FLUSH
10.0000 mL | Freq: Once | INTRAVENOUS | Status: AC
  Administered 2020-08-20: 10 mL
  Filled 2020-08-20: qty 10

## 2020-08-20 MED ORDER — ANTICOAGULANT SODIUM CITRATE 4% (200MG/5ML) IV SOLN
5.0000 mL | Freq: Once | Status: AC
  Administered 2020-08-20: 5 mL
  Filled 2020-08-20: qty 5

## 2020-08-20 NOTE — Patient Instructions (Signed)

## 2020-08-20 NOTE — Progress Notes (Signed)
After approximately 373mL over IVF, patient stated that he was done and requested to be discharged. Port flushed and deaccessed. Dr Alvy Bimler notified.

## 2020-08-30 ENCOUNTER — Telehealth: Payer: Self-pay | Admitting: *Deleted

## 2020-08-30 ENCOUNTER — Other Ambulatory Visit: Payer: Self-pay | Admitting: Hematology and Oncology

## 2020-08-30 MED ORDER — OXYCODONE HCL 15 MG PO TABS: 15.0000 mg | ORAL_TABLET | ORAL | 0 refills | Status: DC | PRN

## 2020-08-30 NOTE — Telephone Encounter (Signed)
Received call from pt asking for refill on his oxycodone.  LF 07/24/20 for # 19.  Message to Dr Alvy Bimler.

## 2020-08-30 NOTE — Telephone Encounter (Signed)
Sent to [facility].

## 2020-09-06 ENCOUNTER — Inpatient Hospital Stay: Payer: Medicaid Other

## 2020-09-06 ENCOUNTER — Other Ambulatory Visit: Payer: Self-pay | Admitting: Hematology and Oncology

## 2020-09-06 ENCOUNTER — Inpatient Hospital Stay: Payer: Medicaid Other | Attending: Hematology and Oncology

## 2020-09-06 ENCOUNTER — Encounter: Payer: Self-pay | Admitting: Hematology and Oncology

## 2020-09-06 ENCOUNTER — Other Ambulatory Visit: Payer: Self-pay

## 2020-09-06 ENCOUNTER — Inpatient Hospital Stay (HOSPITAL_BASED_OUTPATIENT_CLINIC_OR_DEPARTMENT_OTHER): Payer: Medicaid Other | Admitting: Hematology and Oncology

## 2020-09-06 DIAGNOSIS — G9332 Myalgic encephalomyelitis/chronic fatigue syndrome: Secondary | ICD-10-CM

## 2020-09-06 DIAGNOSIS — Z5112 Encounter for antineoplastic immunotherapy: Secondary | ICD-10-CM | POA: Diagnosis present

## 2020-09-06 DIAGNOSIS — G893 Neoplasm related pain (acute) (chronic): Secondary | ICD-10-CM

## 2020-09-06 DIAGNOSIS — C7800 Secondary malignant neoplasm of unspecified lung: Secondary | ICD-10-CM

## 2020-09-06 DIAGNOSIS — E039 Hypothyroidism, unspecified: Secondary | ICD-10-CM

## 2020-09-06 DIAGNOSIS — C119 Malignant neoplasm of nasopharynx, unspecified: Secondary | ICD-10-CM | POA: Insufficient documentation

## 2020-09-06 DIAGNOSIS — C760 Malignant neoplasm of head, face and neck: Secondary | ICD-10-CM

## 2020-09-06 DIAGNOSIS — D61818 Other pancytopenia: Secondary | ICD-10-CM

## 2020-09-06 DIAGNOSIS — R5382 Chronic fatigue, unspecified: Secondary | ICD-10-CM

## 2020-09-06 DIAGNOSIS — Z7189 Other specified counseling: Secondary | ICD-10-CM

## 2020-09-06 DIAGNOSIS — Z5111 Encounter for antineoplastic chemotherapy: Secondary | ICD-10-CM | POA: Diagnosis not present

## 2020-09-06 LAB — COMPREHENSIVE METABOLIC PANEL
ALT: 22 U/L (ref 0–44)
AST: 24 U/L (ref 15–41)
Albumin: 3.9 g/dL (ref 3.5–5.0)
Alkaline Phosphatase: 70 U/L (ref 38–126)
Anion gap: 11 (ref 5–15)
BUN: 17 mg/dL (ref 6–20)
CO2: 28 mmol/L (ref 22–32)
Calcium: 9.7 mg/dL (ref 8.9–10.3)
Chloride: 102 mmol/L (ref 98–111)
Creatinine, Ser: 1.4 mg/dL — ABNORMAL HIGH (ref 0.61–1.24)
GFR, Estimated: >60 mL/min (ref >60)
Glucose, Bld: 152 mg/dL — ABNORMAL HIGH (ref 70–99)
Potassium: 3.6 mmol/L (ref 3.5–5.1)
Sodium: 141 mmol/L (ref 135–145)
Total Bilirubin: 0.3 mg/dL (ref 0.3–1.2)
Total Protein: 6.9 g/dL (ref 6.5–8.1)

## 2020-09-06 LAB — CBC WITH DIFFERENTIAL/PLATELET
Abs Immature Granulocytes: 0.03 10*3/uL (ref 0.00–0.07)
Basophils Absolute: 0 10*3/uL (ref 0.0–0.1)
Basophils Relative: 1 %
Eosinophils Absolute: 0.3 10*3/uL (ref 0.0–0.5)
Eosinophils Relative: 7 %
HCT: 35.2 % — ABNORMAL LOW (ref 39.0–52.0)
Hemoglobin: 11.9 g/dL — ABNORMAL LOW (ref 13.0–17.0)
Immature Granulocytes: 1 %
Lymphocytes Relative: 44 %
Lymphs Abs: 1.7 10*3/uL (ref 0.7–4.0)
MCH: 32.3 pg (ref 26.0–34.0)
MCHC: 33.8 g/dL (ref 30.0–36.0)
MCV: 95.7 fL (ref 80.0–100.0)
Monocytes Absolute: 0.5 10*3/uL (ref 0.1–1.0)
Monocytes Relative: 12 %
Neutro Abs: 1.4 10*3/uL — ABNORMAL LOW (ref 1.7–7.7)
Neutrophils Relative %: 35 %
Platelets: 169 10*3/uL (ref 150–400)
RBC: 3.68 MIL/uL — ABNORMAL LOW (ref 4.22–5.81)
RDW: 18.2 % — ABNORMAL HIGH (ref 11.5–15.5)
WBC: 3.9 10*3/uL — ABNORMAL LOW (ref 4.0–10.5)
nRBC: 0.5 % — ABNORMAL HIGH (ref 0.0–0.2)

## 2020-09-06 MED ORDER — SODIUM CHLORIDE 0.9 % IV SOLN
200.0000 mg | Freq: Once | INTRAVENOUS | Status: AC
  Administered 2020-09-06: 200 mg via INTRAVENOUS
  Filled 2020-09-06: qty 8

## 2020-09-06 MED ORDER — PALONOSETRON HCL INJECTION 0.25 MG/5ML
0.2500 mg | Freq: Once | INTRAVENOUS | Status: AC
  Administered 2020-09-06: 0.25 mg via INTRAVENOUS

## 2020-09-06 MED ORDER — SODIUM CHLORIDE 0.9 % IV SOLN
Freq: Once | INTRAVENOUS | Status: AC
  Filled 2020-09-06: qty 250

## 2020-09-06 MED ORDER — PALONOSETRON HCL INJECTION 0.25 MG/5ML
INTRAVENOUS | Status: AC
  Filled 2020-09-06: qty 5

## 2020-09-06 MED ORDER — SODIUM CHLORIDE 0.9 % IV SOLN
10.0000 mg | Freq: Once | INTRAVENOUS | Status: AC
  Administered 2020-09-06: 10 mg via INTRAVENOUS
  Filled 2020-09-06: qty 10

## 2020-09-06 MED ORDER — SODIUM CHLORIDE 0.9 % IV SOLN
800.0000 mg/m2/d | INTRAVENOUS | Status: DC
  Administered 2020-09-06: 6700 mg via INTRAVENOUS
  Filled 2020-09-06: qty 134

## 2020-09-06 MED ORDER — SODIUM CHLORIDE 0.9 % IV SOLN
150.0000 mg | Freq: Once | INTRAVENOUS | Status: AC
  Administered 2020-09-06: 150 mg via INTRAVENOUS
  Filled 2020-09-06: qty 150

## 2020-09-06 MED ORDER — SODIUM CHLORIDE 0.9 % IV SOLN
477.6000 mg | Freq: Once | INTRAVENOUS | Status: AC
  Administered 2020-09-06: 480 mg via INTRAVENOUS
  Filled 2020-09-06: qty 48

## 2020-09-06 NOTE — Patient Instructions (Signed)
[place] Discharge Instructions for Patients Receiving Chemotherapy  Today you received the following chemotherapy agents: pembrolizumab/carboplatin/fluorouracil.  To help prevent nausea and vomiting after your treatment, we encourage you to take your nausea medication as directed.   If you develop nausea and vomiting that is not controlled by your nausea medication, call the clinic.   BELOW ARE SYMPTOMS THAT SHOULD BE REPORTED IMMEDIATELY:  *FEVER GREATER THAN 100.5 F  *CHILLS WITH OR WITHOUT FEVER  NAUSEA AND VOMITING THAT IS NOT CONTROLLED WITH YOUR NAUSEA MEDICATION  *UNUSUAL SHORTNESS OF BREATH  *UNUSUAL BRUISING OR BLEEDING  TENDERNESS IN MOUTH AND THROAT WITH OR WITHOUT PRESENCE OF ULCERS  *URINARY PROBLEMS  *BOWEL PROBLEMS  UNUSUAL RASH Items with * indicate a potential emergency and should be followed up as soon as possible.  Feel free to call the clinic should you have any questions or concerns. The clinic phone number is [phone].  Please show the [place] at check-in to the Emergency Department and triage nurse.

## 2020-09-06 NOTE — Progress Notes (Signed)
[facility] OFFICE PROGRESS NOTE  Patient Care Team: Patient, No Pcp Per as PCP - General (General Practice) [name], MD as Consulting Physician (Hematology and Oncology) Eppie Gibson, MD as Attending Physician (Radiation Oncology) Jodi Marble, MD as Consulting Physician (Otolaryngology) Philomena Doheny, MD as Referring Physician (Plastic Surgery) Irene Shipper, MD as Consulting Physician (Gastroenterology) System, Provider Not In  ASSESSMENT & PLAN:  Malignant neoplasm of head and neck [facility]) He tolerated recent treatment poorly due to nausea and constipation The neck lesion is visibly smaller He felt very bad which I suspect is due to severe hypothyroidism due to noncompliance with taking Synthroid I will adjust the dose of his treatment according to his renal function today but we will proceed without delay For next cycle, I plan to give him 2 weeks break and restart next year It would continue aggressive supportive IV fluids after each dose of treatment I recommend we proceed for total 6 cycles of treatment before repeating imaging study  Acquired hypothyroidism He is noncompliant taking his medication He felt depressed He is gaining weight We discussed the importance of him taking Synthroid as directed  Pancytopenia, acquired ([place]) This is due to chemotherapy He is not symptomatic Continue treatment as directed After this cycle, he will get his next cycle delayed until early next year This should allow bone marrow recovery  Cancer associated pain The dose of oxycodone was recently increased to 15 mg to use as needed He can continue the same dose and to take additional ibuprofen as needed  Chronic fatigue syndrome I suspect his excessive fatigue and generalized anxiety is due to poor sleep as well as noncompliant with thyroid supplementation Again, I educated the patient the importance of taking his Synthroid   No orders of the defined types were  placed in this encounter.   All questions were answered. The patient knows to call the clinic with any problems, questions or concerns. The total time spent in the appointment was 30 minutes encounter with patients including review of chart and various tests results, discussions about plan of care and coordination of care plan   [name], MD 09/06/2020 11:48 AM  INTERVAL HISTORY: Please see below for problem oriented charting. 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 Pan-endoscopy elsewhere showed cancer from nasopharyngeal space.   01/04/2007 - 02/20/2007 Chemotherapy   He received 2 cycles of cisplatin and 5FU followed by concurrent chemo with weekly cisplatin and radiation. He only received 2 doses of chemo due to severe mucositis, nausea and weight loss.   04/05/2007 - 08/04/2007 Chemotherapy   He received 4 more courses of cisplatin with 5FU and had complete response   07/05/2009 Procedure  Fine-needle aspirate of the right level II lymph nodes come from recurrent metastatic disease. Repeat endoscopy and CT scan show no evidence of disease elsewhere.   07/08/2009 - 12/02/2009 Chemotherapy   He was given 6 cycles of carboplatin, 5-FU and  docetaxel   12/03/2009 Surgery   He has surgery to the residual lymph node on the right neck which showed no evidence of disease.   02/22/2012 Imaging   Repeat imaging study showed large recurrent mass. He was referred elsewhere for further treatment.   05/03/2012 Surgery   He underwent left upper lobectomy.   04/29/2013 Imaging   PEt scan showed lesion on right level II B and lower lung was abnormal   06/03/2013 - 02/02/2014 Chemotherapy   He had 6 cycles of chemotherapy when he was found to have recurrence of cancer and had received oxaliplatin and capecitabine   06/07/2014 Imaging   PET CT scan showed persistent disease in the right neck lymph nodes and left lung   06/29/2014 Procedure   Accession: [id number] repeat LUL biopsy confirmed metastatic cancer   07/18/2014 - 07/31/2014 Radiation Therapy   He received palliative radiation therapy to the lungs   10/10/2014 Imaging   CT scan of the chest, abdomen and pelvis show regression in the size of the lung nodule in the left upper lobe and stable pulmonary nodules   01/24/2015 Imaging   CT scan showed stable disease in neck and lung   06/19/2015 Imaging   CT scan of the neck and the chest show possible mild progression of the nodule in the right side of the neck.   06/25/2015 Imaging   PET scan confirmed disease recurrence in the neck   07/07/2015 Imaging   He had [facility]   09/03/2015 - 08/26/2018 Chemotherapy   He received palliative chemo with Nivolumab   10/29/2015 Imaging   PET CT showed positive response to Rx   02/28/2016 Imaging   Ct abdomen showed abnormal thinkening in his stomach   03/03/2016 Imaging   CT: Right sternocleidomastoid muscle metastasis appears less distinct but otherwise not significantly changed in size or configuration since 06/19/2015.2. Left level 3 lymph node which was hypermetabolic by PET-CT in January 2017 appears slightly smaller   04/01/2016 Imaging   CT cervical spine showed no acute  fracture or traumatic malalignment in the cervical spine   04/22/2016 Procedure   Port-a-cath placed.   06/16/2016 Imaging   Ct neck showed right sternocleidomastoid muscle metastasis is further decreased in conspicuity since May, and has mildly decreased in size since September 2016. Continued stability of sub-centimeter left cervical lymph nodes. No new or progressive metastatic disease in the neck.   06/16/2016 Imaging   CT chest showed stable masslike radiation fibrosis in the left upper lobe. Stable subcentimeter pulmonary nodules in the bilateral lower lobes. No new or progressive metastatic disease in the chest. Nonobstructing left renal stone.   10/13/2016 Imaging   Ct neck showed unchanged right sternocleidomastoid muscle metastasis. Unchanged subcentimeter left cervical lymph nodes. No evidence of new or progressive metastatic disease in the neck.   10/13/2016 Imaging   CT chest showed tiny hypervascular foci in the liver, not definitely seen on prior imaging of 06/16/2016 and 02/28/2016. Abdomen MRI without and with contrast recommended to further evaluate as metastatic disease is a concern. 2. Stable appearance of post treatment changes left upper lung and scattered tiny bilateral pulmonary nodules.   02/11/2017 Imaging   Ct neck: Lymph node mass right posterior neck  appears improved from the prior study. Small posterior lymph nodes on the left unchanged. Occluded right jugular vein unchanged.   02/11/2017 Imaging   1. Similar appearance of postsurgical and radiation changes in the left upper lobe. 2. Similar bilateral pulmonary nodules. 3. No thoracic adenopathy. 4. Subtle foci of post-contrast enhancement within the liver are suboptimally characterized on this nondedicated study. Likely similar. These could either be re-evaluated at followup or more entirely characterized with abdominal MRI. 5. Left nephrolithiasis.   05/19/2017 Imaging   Matted lymph node mass right posterior neck  appears larger in the recent CT. Accurate measurements difficult due to infiltrating tumor margins and infiltration of the muscle. Right jugular vein again appears occluded or resected. Small left posterior lymph nodes stable. Left upper lobe airspace density stable and similar to the prior CT   06/03/2017 PET scan   1. Hypermetabolic ill-defined right level IIb lymph node, about 1.3 cm in diameter with maximum SUV 9.5 (formerly 8.1). Appearance suspicious for residual/recurrent malignancy. No worrisome left-sided lesion. 2. Left suprahilar indistinct opacity demonstrates no worrisome hypermetabolic activity. The 5 mm left lower lobe pulmonary nodule is stable and not currently hypermetabolic although below sensitive PET-CT size thresholds. 3. Other imaging findings of potential clinical significance: Bilateral nonobstructive nephrolithiasis. Chronic bilateral maxillary sinusitis.   05/11/2018 PET scan   1. Continued chronic accentuated metabolic activity in the vicinity of right level IIB and the adjacent right sternocleidomastoid muscle, with ill definition of surrounding tissue planes. Maximum SUV is currently 8.1, formerly 9.5. Accentuated metabolic activity is been present in this vicinity back through 06/25/2015, and there was also some low-level activity in this vicinity on 06/07/2014. Some of this may be from scarring and local muscular activity although clearly a component of residual tumor is difficult to exclude given the focally high activity. 2. Other imaging findings of potential clinical significance: Chronic bilateral maxillary sinusitis. Chronic scarring in the left upper lobe. Chronically stable 5 mm left lower lobe nodule is considered benign. Nonobstructive left nephrolithiasis.   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 COMMENT:No  significant keratinization is identified. Other basaloid carcinomas are in the differential diagnosis. No cell block material is available for further testing.   09/12/2018 Procedure   He underwent fine Needle Aspiration   10/04/2018 PET scan   1. Significant progression of local recurrence laterally in the mid right neck with an enlarging, increasingly hypermetabolic soft tissue mass. This involves the right sternocleidomastoid muscle. 2. Small lymph nodes in the right axilla are increasingly hypermetabolic. These are nonspecific and potentially reactive, although could reflect a small metastases. Small hypermetabolic nodule in the left suprasternal notch is unchanged. 3. No other evidence of metastatic disease.    10/07/2018 - 12/23/2018 Chemotherapy   The patient had cisplatin plus gemzar   12/07/2018 Imaging   1. Decreased size of lateral right neck mass. 2. Unchanged soft tissue nodule in the suprasternal notch. 3. No evidence of new metastatic disease in the neck.    05/30/2019 Imaging   CT neck No clear change or progression compared to the study of March. Overall measurements of the right lateral neck mass are similar, approximately 3 x 1.8 cm. See above discussion. One could argue that there is slight increase in lateral bulging, possibly with an increase in contrast enhancement, towards the inferior margin. This is of questionable validity but could possibly represent some progression or  inflammatory change. Other findings in the region are stable.   07/20/2019 - 09/15/2019 Chemotherapy   The patient had dexamethasone (DECADRON) 4 MG tablet, 1 of 1 cycle, Start date: --, End date: -- palonosetron (ALOXI) injection 0.25 mg, 0.25 mg, Intravenous,  Once, 3 of 4 cycles Administration: 0.25 mg (07/20/2019), 0.25 mg (08/10/2019), 0.25 mg (09/08/2019) CISplatin (PLATINOL) 84 mg in sodium chloride 0.9 % 250 mL chemo infusion, 40 mg/m2 = 84 mg (80 % of original dose 50 mg/m2), Intravenous,   Once, 3 of 4 cycles Dose modification: 40 mg/m2 (80 % of original dose 50 mg/m2, Cycle 1, Reason: Dose Not Tolerated) Administration: 84 mg (07/20/2019), 84 mg (08/10/2019), 83 mg (09/08/2019) gemcitabine (GEMZAR) 1,600 mg in sodium chloride 0.9 % 250 mL chemo infusion, 1,672 mg (80 % of original dose 1,000 mg/m2), Intravenous,  Once, 3 of 4 cycles Dose modification: 800 mg/m2 (80 % of original dose 1,000 mg/m2, Cycle 1, Reason: Provider Judgment) Administration: 1,600 mg (07/20/2019), 1,600 mg (07/27/2019), 1,600 mg (08/10/2019), 1,600 mg (08/17/2019), 1,600 mg (09/08/2019), 1,672 mg (09/15/2019) ondansetron (ZOFRAN) 8 mg, dexamethasone (DECADRON) 10 mg in sodium chloride 0.9 % 50 mL IVPB, , Intravenous,  Once, 3 of 4 cycles Administration:  (09/15/2019) fosaprepitant (EMEND) 150 mg, dexamethasone (DECADRON) 12 mg in sodium chloride 0.9 % 145 mL IVPB, , Intravenous,  Once, 3 of 4 cycles Administration:  (07/20/2019),  (08/10/2019),  (09/08/2019)  for chemotherapy treatment.    10/02/2019 Imaging   CT neck As compared to 05/30/2019, no significant interval change in size of an ill-defined mass within the right lateral neck, again measuring 3.3 x 1.8 cm in transaxial dimensions.   Unchanged mildly enlarged left level I lymph node measuring 1.1 cm in short axis.   Unchanged node or nodule at the thoracic inlet, measuring 1.3 x 0.8 cm.   Please refer to concurrently performed chest CT for a description of findings below the level of the thoracic inlet.     10/02/2019 Imaging   CT chest 1. No new or progressive findings in the chest to suggest metastatic disease. 2. Bilateral subcentimeter solid pulmonary nodules are stable since 2018. 3. Hyperdense 1.1 cm anterior liver focus, not clearly visualized on prior studies. Suggest MRI abdomen without and with IV contrast for further characterization.   10/20/2019 - 12/29/2019 Chemotherapy   The patient had ondansetron (ZOFRAN) injection 8 mg, 8 mg (100  % of original dose 8 mg), Intravenous,  Once, 2 of 5 cycles Dose modification: 8 mg (original dose 8 mg, Cycle 2) Administration: 8 mg (11/17/2019), 8 mg (12/15/2019), 8 mg (12/29/2019) gemcitabine (GEMZAR) 2,000 mg in sodium chloride 0.9 % 250 mL chemo infusion, 2,090 mg, Intravenous,  Once, 3 of 6 cycles Administration: 2,000 mg (10/20/2019), 2,000 mg (11/03/2019), 2,000 mg (11/17/2019), 2,000 mg (12/15/2019), 2,000 mg (12/29/2019)  for chemotherapy treatment.    06/12/2020 Imaging   1. Enlarging superficial, exophytic component of the chronic right sternocleidomastoid muscle mass. See series 6, image 55. 2. Elsewhere stable CT appearance of the Neck.   06/12/2020 Imaging   Post treatment scarring in the left hemithorax, stable. No evidence recurrent or metastatic disease   06/14/2020 -  Chemotherapy   The patient had palonosetron (ALOXI) injection 0.25 mg, 0.25 mg, Intravenous,  Once, 5 of 6 cycles Administration: 0.25 mg (06/14/2020), 0.25 mg (07/05/2020), 0.25 mg (07/26/2020), 0.25 mg (08/16/2020) fosaprepitant (EMEND) 150 mg in sodium chloride 0.9 % 145 mL IVPB, 150 mg, Intravenous,  Once, 5 of 6 cycles Administration: 150 mg (  06/14/2020), 150 mg (07/05/2020), 150 mg (07/26/2020), 150 mg (08/16/2020) pembrolizumab (KEYTRUDA) 200 mg in sodium chloride 0.9 % 50 mL chemo infusion, 200 mg (100 % of original dose 200 mg), Intravenous, Once, 5 of 6 cycles Dose modification: 200 mg (original dose 200 mg, Cycle 1, Reason: Provider Judgment) Administration: 200 mg (06/14/2020), 200 mg (07/05/2020), 200 mg (07/26/2020), 200 mg (08/16/2020) fluorouracil (ADRUCIL) 6,750 mg in sodium chloride 0.9 % 115 mL chemo infusion, 800 mg/m2/day = 6,750 mg (100 % of original dose 800 mg/m2/day), Intravenous, 4D (96 hours ), 5 of 6 cycles Dose modification: 800 mg/m2/day (original dose 800 mg/m2/day, Cycle 1, Reason: Provider Judgment) Administration: 6,750 mg (06/14/2020), 6,750 mg (07/05/2020), 6,700 mg (07/26/2020), 6,700 mg  (08/16/2020) CARBOplatin (PARAPLATIN) 750 mg in sodium chloride 0.9 % 250 mL chemo infusion, 750 mg (100 % of original dose 750 mg), Intravenous,  Once, 5 of 6 cycles Dose modification:   (original dose 750 mg, Cycle 1) Administration: 750 mg (06/14/2020), 730 mg (07/05/2020), 730 mg (07/26/2020), 590 mg (08/16/2020)  for chemotherapy treatment.      REVIEW OF SYSTEMS:   Constitutional: Denies fevers, chills or abnormal weight loss Eyes: Denies blurriness of vision Ears, nose, mouth, throat, and face: Denies mucositis or sore throat Respiratory: Denies cough, dyspnea or wheezes Cardiovascular: Denies palpitation, chest discomfort or lower extremity swelling Gastrointestinal:  Denies nausea, heartburn or change in bowel habits Skin: Denies abnormal skin rashes Lymphatics: Denies new lymphadenopathy or easy bruising Neurological:Denies numbness, tingling or new weaknesses All other systems were reviewed with the patient and are negative.  I have reviewed the past medical history, past surgical history, social history and family history with the patient and they are unchanged from previous note.  ALLERGIES:  is allergic to phenergan [promethazine hcl], heparin, and clindamycin.  MEDICATIONS:  Current Outpatient Medications  Medication Sig Dispense Refill  . atorvastatin (LIPITOR) 40 MG tablet TK 1 T PO  D    . cetirizine (ZYRTEC) 10 MG tablet TAKE 1 TABLET(10 MG) BY MOUTH DAILY 30 tablet 1  . diclofenac Sodium (VOLTAREN) 1 % GEL Apply topically.    Marland Kitchen levothyroxine (SYNTHROID) 137 MCG tablet Take 1 tablet (137 mcg total) by mouth daily before breakfast. 90 tablet 9  . LORazepam (ATIVAN) 1 MG tablet Take 1 tablet (1 mg total) by mouth every 8 (eight) hours as needed for anxiety. 30 tablet 0  . omeprazole (PRILOSEC) 40 MG capsule Take 1 capsule (40 mg total) by mouth daily. 30 capsule 9  . ondansetron (ZOFRAN) 8 MG tablet Take 1 tablet (8 mg total) by mouth every 8 (eight) hours as needed  for nausea or vomiting. 90 tablet 1  . oxyCODONE (ROXICODONE) 15 MG immediate release tablet Take 1 tablet (15 mg total) by mouth every 4 (four) hours as needed. 90 tablet 0  . PARoxetine (PAXIL) 20 MG tablet Take 1 tablet (20 mg total) by mouth daily. 30 tablet 5  . polyethylene glycol (MIRALAX) packet Take 17 g by mouth daily. (Patient taking differently: Take 17 g by mouth daily as needed for mild constipation or moderate constipation. ) 14 each 3  . senna-docusate (SENOKOT-S) 8.6-50 MG tablet Take 2 tablets by mouth 3 (three) times daily. 90 tablet 0  . Vitamin D, Ergocalciferol, (DRISDOL) 1.25 MG (50000 UT) CAPS capsule TK 1 C PO WEEKLY     No current facility-administered medications for this visit.   Facility-Administered Medications Ordered in Other Visits  Medication Dose Route Frequency Provider Last Rate Last Admin  .  anticoagulant sodium citrate solution 5 mL  5 mL Intracatheter Once Alvy Bimler, Rey Dansby, MD      . anticoagulant sodium citrate solution 5 mL  5 mL Intracatheter Once Alvy Bimler, Lulamae Skorupski, MD      . CARBOplatin (PARAPLATIN) 480 mg in sodium chloride 0.9 % 250 mL chemo infusion  480 mg Intravenous Once Alvy Bimler, Chiann Goffredo, MD      . dexamethasone (DECADRON) 10 mg in sodium chloride 0.9 % 50 mL IVPB  10 mg Intravenous Once Alvy Bimler, Zlaty Alexa, MD      . fluorouracil (ADRUCIL) 6,700 mg in sodium chloride 0.9 % 116 mL chemo infusion  800 mg/m2/day (Order-Specific) Intravenous 4 days Alvy Bimler, Tyquarius Paglia, MD      . fosaprepitant (EMEND) 150 mg in sodium chloride 0.9 % 145 mL IVPB  150 mg Intravenous Once Alvy Bimler, Lynetta Tomczak, MD      . palonosetron (ALOXI) injection 0.25 mg  0.25 mg Intravenous Once Alvy Bimler, Iveth Heidemann, MD      . pembrolizumab (KEYTRUDA) 200 mg in sodium chloride 0.9 % 50 mL chemo infusion  200 mg Intravenous Once Alvy Bimler, Dhanvi Boesen, MD        PHYSICAL EXAMINATION: ECOG PERFORMANCE STATUS: 1 - Symptomatic but completely ambulatory  Vitals:   09/06/20 1125  BP: 128/87  Pulse: 87  Resp: 18  SpO2: 100%   Filed  Weights   09/06/20 1125  Weight: 199 lb (90.3 kg)    GENERAL:alert, no distress and comfortable NECK: The neck mass is smaller NEURO: alert & oriented x 3 with fluent speech, no focal motor/sensory deficits  LABORATORY DATA:  I have reviewed the data as listed    Component Value Date/Time   NA 141 09/06/2020 1100   NA 139 09/22/2017 0829   K 3.6 09/06/2020 1100   K 3.5 09/22/2017 0829   CL 102 09/06/2020 1100   CO2 28 09/06/2020 1100   CO2 26 09/22/2017 0829   GLUCOSE 152 (H) 09/06/2020 1100   GLUCOSE 133 09/22/2017 0829   BUN 17 09/06/2020 1100   BUN 14.1 09/22/2017 0829   CREATININE 1.40 (H) 09/06/2020 1100   CREATININE 0.93 02/06/2019 1215   CREATININE 0.9 09/22/2017 0829   CALCIUM 9.7 09/06/2020 1100   CALCIUM 9.1 09/22/2017 0829   PROT 6.9 09/06/2020 1100   PROT 6.8 09/22/2017 0829   ALBUMIN 3.9 09/06/2020 1100   ALBUMIN 4.1 09/22/2017 0829   AST 24 09/06/2020 1100   AST 31 02/06/2019 1215   AST 22 09/22/2017 0829   ALT 22 09/06/2020 1100   ALT 48 (H) 02/06/2019 1215   ALT 30 09/22/2017 0829   ALKPHOS 70 09/06/2020 1100   ALKPHOS 55 09/22/2017 0829   BILITOT 0.3 09/06/2020 1100   BILITOT 0.2 (L) 02/06/2019 1215   BILITOT 0.35 09/22/2017 0829   GFRNONAA >60 09/06/2020 1100   GFRNONAA >60 02/06/2019 1215   GFRAA >60 07/05/2020 0845   GFRAA >60 02/06/2019 1215    No results found for: SPEP, UPEP  Lab Results  Component Value Date   WBC 3.9 (L) 09/06/2020   NEUTROABS 1.4 (L) 09/06/2020   HGB 11.9 (L) 09/06/2020   HCT 35.2 (L) 09/06/2020   MCV 95.7 09/06/2020   PLT 169 09/06/2020      Chemistry      Component Value Date/Time   NA 141 09/06/2020 1100   NA 139 09/22/2017 0829   K 3.6 09/06/2020 1100   K 3.5 09/22/2017 0829   CL 102 09/06/2020 1100   CO2 28 09/06/2020 1100   CO2  26 09/22/2017 0829   BUN 17 09/06/2020 1100   BUN 14.1 09/22/2017 0829   CREATININE 1.40 (H) 09/06/2020 1100   CREATININE 0.93 02/06/2019 1215   CREATININE 0.9  09/22/2017 0829      Component Value Date/Time   CALCIUM 9.7 09/06/2020 1100   CALCIUM 9.1 09/22/2017 0829   ALKPHOS 70 09/06/2020 1100   ALKPHOS 55 09/22/2017 0829   AST 24 09/06/2020 1100   AST 31 02/06/2019 1215   AST 22 09/22/2017 0829   ALT 22 09/06/2020 1100   ALT 48 (H) 02/06/2019 1215   ALT 30 09/22/2017 0829   BILITOT 0.3 09/06/2020 1100   BILITOT 0.2 (L) 02/06/2019 1215   BILITOT 0.35 09/22/2017 8719

## 2020-09-06 NOTE — Assessment & Plan Note (Signed)
He is noncompliant taking his medication He felt depressed He is gaining weight We discussed the importance of him taking Synthroid as directed

## 2020-09-06 NOTE — Progress Notes (Signed)
Per Dr Alvy Bimler, ok to treat with ANC 1.4  Patient completed infusion without incident. In no visible distress at time of discharge. Ambulated out of cancer center. AVS declined.

## 2020-09-06 NOTE — Assessment & Plan Note (Signed)
He tolerated recent treatment poorly due to nausea and constipation The neck lesion is visibly smaller He felt very bad which I suspect is due to severe hypothyroidism due to noncompliance with taking Synthroid I will adjust the dose of his treatment according to his renal function today but we will proceed without delay For next cycle, I plan to give him 2 weeks break and restart next year It would continue aggressive supportive IV fluids after each dose of treatment I recommend we proceed for total 6 cycles of treatment before repeating imaging study

## 2020-09-06 NOTE — Assessment & Plan Note (Signed)
This is due to chemotherapy He is not symptomatic Continue treatment as directed After this cycle, he will get his next cycle delayed until early next year This should allow bone marrow recovery

## 2020-09-06 NOTE — Assessment & Plan Note (Signed)
The dose of oxycodone was recently increased to 15 mg to use as needed He can continue the same dose and to take additional ibuprofen as needed

## 2020-09-06 NOTE — Assessment & Plan Note (Signed)
I suspect his excessive fatigue and generalized anxiety is due to poor sleep as well as noncompliant with thyroid supplementation Again, I educated the patient the importance of taking his Synthroid

## 2020-09-07 ENCOUNTER — Inpatient Hospital Stay: Payer: Medicaid Other

## 2020-09-09 ENCOUNTER — Inpatient Hospital Stay: Payer: Medicaid Other

## 2020-09-10 ENCOUNTER — Telehealth: Payer: Self-pay

## 2020-09-10 ENCOUNTER — Other Ambulatory Visit: Payer: Self-pay

## 2020-09-10 ENCOUNTER — Inpatient Hospital Stay: Payer: Medicaid Other

## 2020-09-10 VITALS — BP 126/79 | HR 71 | Temp 98.5°F | Resp 18

## 2020-09-10 DIAGNOSIS — C760 Malignant neoplasm of head, face and neck: Secondary | ICD-10-CM

## 2020-09-10 DIAGNOSIS — Z95828 Presence of other vascular implants and grafts: Secondary | ICD-10-CM

## 2020-09-10 DIAGNOSIS — Z7189 Other specified counseling: Secondary | ICD-10-CM

## 2020-09-10 DIAGNOSIS — Z5111 Encounter for antineoplastic chemotherapy: Secondary | ICD-10-CM | POA: Diagnosis not present

## 2020-09-10 DIAGNOSIS — C7800 Secondary malignant neoplasm of unspecified lung: Secondary | ICD-10-CM

## 2020-09-10 MED ORDER — ANTICOAGULANT SODIUM CITRATE 4% (200MG/5ML) IV SOLN
5.0000 mL | Freq: Once | Status: AC
  Administered 2020-09-10: 5 mL
  Filled 2020-09-10: qty 5

## 2020-09-10 MED ORDER — SODIUM CHLORIDE 0.9% FLUSH
10.0000 mL | INTRAVENOUS | Status: DC | PRN
  Administered 2020-09-10: 10 mL
  Filled 2020-09-10: qty 10

## 2020-09-10 MED ORDER — ONDANSETRON HCL 4 MG/2ML IJ SOLN
8.0000 mg | Freq: Once | INTRAMUSCULAR | Status: AC
  Administered 2020-09-10: 8 mg via INTRAVENOUS

## 2020-09-10 MED ORDER — ONDANSETRON HCL 4 MG/2ML IJ SOLN
INTRAMUSCULAR | Status: AC
  Filled 2020-09-10: qty 4

## 2020-09-10 MED ORDER — SODIUM CHLORIDE 0.9 % IV SOLN
Freq: Once | INTRAVENOUS | Status: AC
  Filled 2020-09-10: qty 250

## 2020-09-10 NOTE — Progress Notes (Signed)
Pt having a lot of anxiety and requesting to leave about 1/2 way through IV fluid abg. . Dr. Alvy Bimler notified. Pump d/c'd w/ 5.6 ml remaining. Per order from Dr. Alvy Bimler. Pt discarged in stable condition

## 2020-09-10 NOTE — Telephone Encounter (Signed)
Called and left another message asking him to call the office back.

## 2020-09-10 NOTE — Telephone Encounter (Signed)
-----   Message from [name], MD sent at 09/10/2020  9:41 AM EST ----- Regarding: can you call and make sure he is taking synthroid? He is for infusion today

## 2020-09-10 NOTE — Telephone Encounter (Signed)
Called and left a message asking him to call the office back. 

## 2020-09-13 ENCOUNTER — Telehealth: Payer: Self-pay

## 2020-09-13 ENCOUNTER — Other Ambulatory Visit: Payer: Self-pay

## 2020-09-13 MED ORDER — LEVOTHYROXINE SODIUM 137 MCG PO TABS
137.0000 ug | ORAL_TABLET | Freq: Every day | ORAL | 9 refills | Status: DC
Start: 2020-09-13 — End: 2020-11-29

## 2020-09-13 NOTE — Telephone Encounter (Signed)
He called and left a message. He needs a refill on Synthroid Rx. He is complaining of abdominal pain, it feels like he has a hole in his gut. When he takes a deep breath the pain is worse. He is complaining of blister areas in mouth. He is able to drink and complaining of some nausea.  Called back. Per Dr. Alvy Bimler, instructed to go to ER now to be evaluated. Synthroid Rx sent to his preferred pharmacy. He declined to go to the ER now. He states "he is sick of hospitals and concerned for his mental health". He said he may go tomorrow if he is not feeling better, he states over and over, " he needs a break from hospitals". Repeated multiple times to go to ER and ask him to call the office back if needed. Instructed to take Zofran for nausea and push fluids. He verbalized understanding.

## 2020-09-17 ENCOUNTER — Other Ambulatory Visit: Payer: Self-pay | Admitting: Hematology and Oncology

## 2020-09-17 MED ORDER — OXYCODONE HCL 15 MG PO TABS: 15.0000 mg | ORAL_TABLET | ORAL | 0 refills | Status: DC | PRN

## 2020-10-07 ENCOUNTER — Telehealth: Payer: Self-pay | Admitting: *Deleted

## 2020-10-07 NOTE — Telephone Encounter (Signed)
Jalynn called for a refill of his Oxycodone 15 mg to be filled at [facility] on [address].

## 2020-10-08 ENCOUNTER — Other Ambulatory Visit: Payer: Self-pay | Admitting: Hematology and Oncology

## 2020-10-08 MED ORDER — OXYCODONE HCL 15 MG PO TABS: 15.0000 mg | ORAL_TABLET | ORAL | 0 refills | Status: DC | PRN

## 2020-10-08 NOTE — Telephone Encounter (Signed)
done

## 2020-10-11 ENCOUNTER — Other Ambulatory Visit: Payer: Self-pay

## 2020-10-11 ENCOUNTER — Inpatient Hospital Stay (HOSPITAL_BASED_OUTPATIENT_CLINIC_OR_DEPARTMENT_OTHER): Payer: Medicaid Other | Admitting: Hematology and Oncology

## 2020-10-11 ENCOUNTER — Inpatient Hospital Stay: Payer: Medicaid Other | Attending: Hematology and Oncology

## 2020-10-11 ENCOUNTER — Encounter: Payer: Self-pay | Admitting: Hematology and Oncology

## 2020-10-11 ENCOUNTER — Inpatient Hospital Stay: Payer: Medicaid Other

## 2020-10-11 VITALS — BP 151/81 | HR 95 | Temp 97.6°F | Resp 18 | Ht 69.0 in | Wt 201.4 lb

## 2020-10-11 DIAGNOSIS — C119 Malignant neoplasm of nasopharynx, unspecified: Secondary | ICD-10-CM | POA: Diagnosis present

## 2020-10-11 DIAGNOSIS — C760 Malignant neoplasm of head, face and neck: Secondary | ICD-10-CM

## 2020-10-11 DIAGNOSIS — Z79899 Other long term (current) drug therapy: Secondary | ICD-10-CM | POA: Insufficient documentation

## 2020-10-11 DIAGNOSIS — Z5111 Encounter for antineoplastic chemotherapy: Secondary | ICD-10-CM | POA: Diagnosis not present

## 2020-10-11 DIAGNOSIS — E039 Hypothyroidism, unspecified: Secondary | ICD-10-CM

## 2020-10-11 DIAGNOSIS — T451X5A Adverse effect of antineoplastic and immunosuppressive drugs, initial encounter: Secondary | ICD-10-CM

## 2020-10-11 DIAGNOSIS — Z5112 Encounter for antineoplastic immunotherapy: Secondary | ICD-10-CM | POA: Insufficient documentation

## 2020-10-11 DIAGNOSIS — C7800 Secondary malignant neoplasm of unspecified lung: Secondary | ICD-10-CM

## 2020-10-11 DIAGNOSIS — G893 Neoplasm related pain (acute) (chronic): Secondary | ICD-10-CM | POA: Diagnosis not present

## 2020-10-11 DIAGNOSIS — G620 Drug-induced polyneuropathy: Secondary | ICD-10-CM | POA: Diagnosis not present

## 2020-10-11 DIAGNOSIS — R112 Nausea with vomiting, unspecified: Secondary | ICD-10-CM

## 2020-10-11 DIAGNOSIS — Z95828 Presence of other vascular implants and grafts: Secondary | ICD-10-CM

## 2020-10-11 DIAGNOSIS — Z7189 Other specified counseling: Secondary | ICD-10-CM

## 2020-10-11 LAB — CBC WITH DIFFERENTIAL/PLATELET
Abs Immature Granulocytes: 0.01 10*3/uL (ref 0.00–0.07)
Basophils Absolute: 0 10*3/uL (ref 0.0–0.1)
Basophils Relative: 1 %
Eosinophils Absolute: 0.1 10*3/uL (ref 0.0–0.5)
Eosinophils Relative: 3 %
HCT: 37.4 % — ABNORMAL LOW (ref 39.0–52.0)
Hemoglobin: 12.6 g/dL — ABNORMAL LOW (ref 13.0–17.0)
Immature Granulocytes: 0 %
Lymphocytes Relative: 37 %
Lymphs Abs: 1.5 10*3/uL (ref 0.7–4.0)
MCH: 33.2 pg (ref 26.0–34.0)
MCHC: 33.7 g/dL (ref 30.0–36.0)
MCV: 98.7 fL (ref 80.0–100.0)
Monocytes Absolute: 0.4 10*3/uL (ref 0.1–1.0)
Monocytes Relative: 10 %
Neutro Abs: 2 10*3/uL (ref 1.7–7.7)
Neutrophils Relative %: 49 %
Platelets: 231 10*3/uL (ref 150–400)
RBC: 3.79 MIL/uL — ABNORMAL LOW (ref 4.22–5.81)
RDW: 14.4 % (ref 11.5–15.5)
WBC: 4.1 10*3/uL (ref 4.0–10.5)
nRBC: 0 % (ref 0.0–0.2)

## 2020-10-11 LAB — COMPREHENSIVE METABOLIC PANEL
ALT: 29 U/L (ref 0–44)
AST: 24 U/L (ref 15–41)
Albumin: 4 g/dL (ref 3.5–5.0)
Alkaline Phosphatase: 67 U/L (ref 38–126)
Anion gap: 10 (ref 5–15)
BUN: 11 mg/dL (ref 6–20)
CO2: 26 mmol/L (ref 22–32)
Calcium: 9.2 mg/dL (ref 8.9–10.3)
Chloride: 103 mmol/L (ref 98–111)
Creatinine, Ser: 1.01 mg/dL (ref 0.61–1.24)
GFR, Estimated: >60 mL/min (ref >60)
Glucose, Bld: 203 mg/dL — ABNORMAL HIGH (ref 70–99)
Potassium: 4 mmol/L (ref 3.5–5.1)
Sodium: 139 mmol/L (ref 135–145)
Total Bilirubin: 0.4 mg/dL (ref 0.3–1.2)
Total Protein: 7.1 g/dL (ref 6.5–8.1)

## 2020-10-11 LAB — TSH: TSH: 1.243 u[IU]/mL (ref 0.320–4.118)

## 2020-10-11 MED ORDER — LORAZEPAM 2 MG/ML IJ SOLN
0.5000 mg | Freq: Once | INTRAMUSCULAR | Status: AC
  Administered 2020-10-11: 0.5 mg via INTRAVENOUS

## 2020-10-11 MED ORDER — SODIUM CHLORIDE 0.9 % IV SOLN
800.0000 mg/m2/d | INTRAVENOUS | Status: AC
  Administered 2020-10-11: 6700 mg via INTRAVENOUS
  Filled 2020-10-11: qty 134

## 2020-10-11 MED ORDER — SODIUM CHLORIDE 0.9 % IV SOLN
450.0000 mg | Freq: Once | INTRAVENOUS | Status: AC
  Administered 2020-10-11: 450 mg via INTRAVENOUS
  Filled 2020-10-11: qty 45

## 2020-10-11 MED ORDER — SODIUM CHLORIDE 0.9 % IV SOLN
10.0000 mg | Freq: Once | INTRAVENOUS | Status: AC
  Administered 2020-10-11: 10 mg via INTRAVENOUS
  Filled 2020-10-11: qty 10

## 2020-10-11 MED ORDER — PEMBROLIZUMAB CHEMO INJECTION 100 MG/4ML
200.0000 mg | Freq: Once | INTRAVENOUS | Status: AC
  Administered 2020-10-11: 200 mg via INTRAVENOUS
  Filled 2020-10-11: qty 8

## 2020-10-11 MED ORDER — LORAZEPAM 2 MG/ML IJ SOLN
INTRAMUSCULAR | Status: AC
  Filled 2020-10-11: qty 1

## 2020-10-11 MED ORDER — PALONOSETRON HCL INJECTION 0.25 MG/5ML
0.2500 mg | Freq: Once | INTRAVENOUS | Status: AC
  Administered 2020-10-11: 0.25 mg via INTRAVENOUS

## 2020-10-11 MED ORDER — SODIUM CHLORIDE 0.9 % IV SOLN
150.0000 mg | Freq: Once | INTRAVENOUS | Status: AC
  Administered 2020-10-11: 150 mg via INTRAVENOUS
  Filled 2020-10-11: qty 150

## 2020-10-11 MED ORDER — PALONOSETRON HCL INJECTION 0.25 MG/5ML
INTRAVENOUS | Status: AC
  Filled 2020-10-11: qty 5

## 2020-10-11 MED ORDER — SODIUM CHLORIDE 0.9% FLUSH
10.0000 mL | Freq: Once | INTRAVENOUS | Status: AC
Start: 2020-10-11 — End: 2020-10-11
  Administered 2020-10-11: 10 mL
  Filled 2020-10-11: qty 10

## 2020-10-11 MED ORDER — SODIUM CHLORIDE 0.9 % IV SOLN
Freq: Once | INTRAVENOUS | Status: AC
  Filled 2020-10-11: qty 250

## 2020-10-11 MED ORDER — GABAPENTIN 300 MG PO CAPS: 300.0000 mg | ORAL_CAPSULE | Freq: Two times a day (BID) | ORAL | 11 refills | Status: DC

## 2020-10-11 NOTE — Assessment & Plan Note (Signed)
He has neuropathic pain I recommend trial of gabapentin along with his oxycodone and he is in agreement

## 2020-10-11 NOTE — Patient Instructions (Signed)
[place] Discharge Instructions for Patients Receiving Chemotherapy  Today you received the following chemotherapy agents: pembrolizumab, carboplatin, and fluorouracil.  To help prevent nausea and vomiting after your treatment, we encourage you to take your nausea medication as directed.   If you develop nausea and vomiting that is not controlled by your nausea medication, call the clinic.   BELOW ARE SYMPTOMS THAT SHOULD BE REPORTED IMMEDIATELY:  *FEVER GREATER THAN 100.5 F  *CHILLS WITH OR WITHOUT FEVER  NAUSEA AND VOMITING THAT IS NOT CONTROLLED WITH YOUR NAUSEA MEDICATION  *UNUSUAL SHORTNESS OF BREATH  *UNUSUAL BRUISING OR BLEEDING  TENDERNESS IN MOUTH AND THROAT WITH OR WITHOUT PRESENCE OF ULCERS  *URINARY PROBLEMS  *BOWEL PROBLEMS  UNUSUAL RASH Items with * indicate a potential emergency and should be followed up as soon as possible.  Feel free to call the clinic should you have any questions or concerns. The clinic phone number is [phone].  Please show the [place] at check-in to the Emergency Department and triage nurse.

## 2020-10-11 NOTE — Assessment & Plan Note (Signed)
The dose of oxycodone was recently increased to 15 mg to use as needed He can continue the same dose and to take additional ibuprofen as needed

## 2020-10-11 NOTE — Progress Notes (Unsigned)
Immediately after starting saline, patient began c/o nausea with gagging. Sandi Mealy, PA-C notified. Orders received and medicated as documented in [facility]. Patient verbalized near-immediate relief and continue through treatment with no further issues. Due to receiving sedating medication, arrangements will be made for transport home.

## 2020-10-11 NOTE — Assessment & Plan Note (Signed)
He has chronic nausea He will continue Zofran and lorazepam as needed He will come here for IV fluid support as well

## 2020-10-11 NOTE — Assessment & Plan Note (Signed)
Clinically, he appears to be responding well to treatment We will proceed with cycle 6 today After that, I plan to order CT imaging for objective assessment of response to therapy If confirmed no disease progression, I will switch him to maintenance Rx with pembrolizumab

## 2020-10-11 NOTE — Progress Notes (Signed)
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[place]) Clinically, he appears to be responding well to treatment We will proceed with cycle 6 today After that, I plan to order CT imaging for objective assessment of response to therapy If confirmed no disease progression, I will switch him to maintenance Rx with pembrolizumab  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 CT Soft Tissue Neck W Contrast    Standing Status:   Future    Standing Expiration Date:   10/11/2021    Order Specific Question:   If indicated for the ordered procedure, I authorize the administration of contrast media per Radiology protocol    Answer:   Yes    Order Specific Question:   Preferred imaging location?    Answer:   [facility]    All questions were answered. The patient knows to call the clinic with any problems, questions or concerns. The total  time spent in the appointment was 30 minutes encounter with patients including review of chart and various tests results, discussions about plan of care and coordination of care plan   [name], MD 10/11/2020 10:36 AM  INTERVAL HISTORY: Please see below for problem oriented charting. 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 Pan-endoscopy elsewhere showed cancer from nasopharyngeal space.   01/04/2007 - 02/20/2007 Chemotherapy   He received 2 cycles of cisplatin and 5FU followed by concurrent chemo with weekly cisplatin and radiation. He only received 2 doses of chemo due to severe mucositis, nausea and weight loss.   04/05/2007 - 08/04/2007 Chemotherapy   He received 4 more courses of cisplatin with 5FU and had complete response   07/05/2009 Procedure  Fine-needle aspirate of the right level II lymph nodes come from recurrent metastatic disease. Repeat endoscopy and CT scan show no evidence of disease elsewhere.   07/08/2009 - 12/02/2009 Chemotherapy   He was given 6 cycles of carboplatin, 5-FU and docetaxel   12/03/2009 Surgery   He has surgery to the residual lymph node on the right neck which showed no evidence of  disease.   02/22/2012 Imaging   Repeat imaging study showed large recurrent mass. He was referred elsewhere for further treatment.   05/03/2012 Surgery   He underwent left upper lobectomy.   04/29/2013 Imaging   PEt scan showed lesion on right level II B and lower lung was abnormal   06/03/2013 - 02/02/2014 Chemotherapy   He had 6 cycles of chemotherapy when he was found to have recurrence of cancer and had received oxaliplatin and capecitabine   06/07/2014 Imaging   PET CT scan showed persistent disease in the right neck lymph nodes and left lung   06/29/2014 Procedure   Accession: [id number] repeat LUL biopsy confirmed metastatic cancer   07/18/2014 - 07/31/2014 Radiation Therapy   He received palliative radiation therapy to the lungs   10/10/2014 Imaging   CT scan of the chest, abdomen and pelvis show regression in the size of the lung nodule in the left upper lobe and stable pulmonary nodules   01/24/2015 Imaging   CT scan showed stable disease in neck and lung   06/19/2015 Imaging   CT scan of the neck and the chest show possible mild progression of the nodule in the right side of the neck.   06/25/2015 Imaging   PET scan confirmed disease recurrence in the neck   07/07/2015 Imaging   He had [facility] neck at [facility]   09/03/2015 - 08/26/2018 Chemotherapy   He received palliative chemo with Nivolumab   10/29/2015 Imaging   PET CT showed positive response to Rx   02/28/2016 Imaging   Ct abdomen showed abnormal thinkening in his stomach   03/03/2016 Imaging   CT: Right sternocleidomastoid muscle metastasis appears less distinct but otherwise not significantly changed in size or configuration since 06/19/2015.2. Left level 3 lymph node which was hypermetabolic by PET-CT in January 2017 appears slightly smaller   04/01/2016 Imaging   CT cervical spine showed no acute fracture or traumatic malalignment in the cervical spine   04/22/2016 Procedure   Port-a-cath placed.   06/16/2016 Imaging    Ct neck showed right sternocleidomastoid muscle metastasis is further decreased in conspicuity since May, and has mildly decreased in size since September 2016. Continued stability of sub-centimeter left cervical lymph nodes. No new or progressive metastatic disease in the neck.   06/16/2016 Imaging   CT chest showed stable masslike radiation fibrosis in the left upper lobe. Stable subcentimeter pulmonary nodules in the bilateral lower lobes. No new or progressive metastatic disease in the chest. Nonobstructing left renal stone.   10/13/2016 Imaging   Ct neck showed unchanged right sternocleidomastoid muscle metastasis. Unchanged subcentimeter left cervical lymph nodes. No evidence of new or progressive metastatic disease in the neck.   10/13/2016 Imaging   CT chest showed tiny hypervascular foci in the liver, not definitely seen on prior imaging of 06/16/2016 and 02/28/2016. Abdomen MRI without and with contrast recommended to further evaluate as metastatic disease is a concern. 2. Stable appearance of post treatment changes left upper lung and scattered tiny bilateral pulmonary nodules.   02/11/2017 Imaging   Ct neck: Lymph node mass right posterior neck  appears improved from the prior study. Small posterior lymph nodes on the left unchanged. Occluded right jugular vein unchanged.   02/11/2017 Imaging   1. Similar appearance of postsurgical and radiation changes in the left upper lobe. 2. Similar bilateral pulmonary nodules. 3. No thoracic adenopathy. 4. Subtle foci of post-contrast enhancement within the liver are suboptimally characterized on this nondedicated study. Likely similar. These could either be re-evaluated at followup or more entirely characterized with abdominal MRI. 5. Left nephrolithiasis.   05/19/2017 Imaging   Matted lymph node mass right posterior neck appears larger in the recent CT. Accurate measurements difficult due to infiltrating tumor margins and infiltration of the  muscle. Right jugular vein again appears occluded or resected. Small left posterior lymph nodes stable. Left upper lobe airspace density stable and similar to the prior CT   06/03/2017 PET scan   1. Hypermetabolic ill-defined right level IIb lymph node, about 1.3 cm in diameter with maximum SUV 9.5 (formerly 8.1). Appearance suspicious for residual/recurrent malignancy. No worrisome left-sided lesion. 2. Left suprahilar indistinct opacity demonstrates no worrisome hypermetabolic activity. The 5 mm left lower lobe pulmonary nodule is stable and not currently hypermetabolic although below sensitive PET-CT size thresholds. 3. Other imaging findings of potential clinical significance: Bilateral nonobstructive nephrolithiasis. Chronic bilateral maxillary sinusitis.   05/11/2018 PET scan   1. Continued chronic accentuated metabolic activity in the vicinity of right level IIB and the adjacent right sternocleidomastoid muscle, with ill definition of surrounding tissue planes. Maximum SUV is currently 8.1, formerly 9.5. Accentuated metabolic activity is been present in this vicinity back through 06/25/2015, and there was also some low-level activity in this vicinity on 06/07/2014. Some of this may be from scarring and local muscular activity although clearly a component of residual tumor is difficult to exclude given the focally high activity. 2. Other imaging findings of potential clinical significance: Chronic bilateral maxillary sinusitis. Chronic scarring in the left upper lobe. Chronically stable 5 mm left lower lobe nodule is considered benign. Nonobstructive left nephrolithiasis.   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 COMMENT:No significant keratinization is identified. Other basaloid carcinomas are in the differential diagnosis. No cell block material is  available for further testing.   09/12/2018 Procedure   He underwent fine Needle Aspiration   10/04/2018 PET scan   1. Significant progression of local recurrence laterally in the mid right neck with an enlarging, increasingly hypermetabolic soft tissue mass. This involves the right sternocleidomastoid muscle. 2. Small lymph nodes in the right axilla are increasingly hypermetabolic. These are nonspecific and potentially reactive, although could reflect a small metastases. Small hypermetabolic nodule in the left suprasternal notch is unchanged. 3. No other evidence of metastatic disease.    10/07/2018 - 12/23/2018 Chemotherapy   The patient had cisplatin plus gemzar   12/07/2018 Imaging   1. Decreased size of lateral right neck mass. 2. Unchanged soft tissue nodule in the suprasternal notch. 3. No evidence of new metastatic disease in the neck.    05/30/2019 Imaging   CT neck No clear change or progression compared to the study of March. Overall measurements of the right lateral neck mass are similar, approximately 3 x 1.8 cm. See above discussion. One could argue that there is slight increase in lateral bulging, possibly with an increase in contrast enhancement, towards the inferior margin. This is of questionable validity but could possibly represent some progression or  inflammatory change. Other findings in the region are stable.   07/20/2019 - 09/15/2019 Chemotherapy   The patient had dexamethasone (DECADRON) 4 MG tablet, 1 of 1 cycle, Start date: --, End date: -- palonosetron (ALOXI) injection 0.25 mg, 0.25 mg, Intravenous,  Once, 3 of 4 cycles Administration: 0.25 mg (07/20/2019), 0.25 mg (08/10/2019), 0.25 mg (09/08/2019) CISplatin (PLATINOL) 84 mg in sodium chloride 0.9 % 250 mL chemo infusion, 40 mg/m2 = 84 mg (80 % of original dose 50 mg/m2), Intravenous,  Once, 3 of 4 cycles Dose modification: 40 mg/m2 (80 % of original dose 50 mg/m2, Cycle 1, Reason: Dose Not  Tolerated) Administration: 84 mg (07/20/2019), 84 mg (08/10/2019), 83 mg (09/08/2019) gemcitabine (GEMZAR) 1,600 mg in sodium chloride 0.9 % 250 mL chemo infusion, 1,672 mg (80 % of original dose 1,000 mg/m2), Intravenous,  Once, 3 of 4 cycles Dose modification: 800 mg/m2 (80 % of original dose 1,000 mg/m2, Cycle 1, Reason: Provider Judgment) Administration: 1,600 mg (07/20/2019), 1,600 mg (07/27/2019), 1,600 mg (08/10/2019), 1,600 mg (08/17/2019), 1,600 mg (09/08/2019), 1,672 mg (09/15/2019) ondansetron (ZOFRAN) 8 mg, dexamethasone (DECADRON) 10 mg in sodium chloride 0.9 % 50 mL IVPB, , Intravenous,  Once, 3 of 4 cycles Administration:  (09/15/2019) fosaprepitant (EMEND) 150 mg, dexamethasone (DECADRON) 12 mg in sodium chloride 0.9 % 145 mL IVPB, , Intravenous,  Once, 3 of 4 cycles Administration:  (07/20/2019),  (08/10/2019),  (09/08/2019)  for chemotherapy treatment.    10/02/2019 Imaging   CT neck As compared to 05/30/2019, no significant interval change in size of an ill-defined mass within the right lateral neck, again measuring 3.3 x 1.8 cm in transaxial dimensions.   Unchanged mildly enlarged left level I lymph node measuring 1.1 cm in short axis.   Unchanged node or nodule at the thoracic inlet, measuring 1.3 x 0.8 cm.   Please refer to concurrently performed chest CT for a description of findings below the level of the thoracic inlet.     10/02/2019 Imaging   CT chest 1. No new or progressive findings in the chest to suggest metastatic disease. 2. Bilateral subcentimeter solid pulmonary nodules are stable since 2018. 3. Hyperdense 1.1 cm anterior liver focus, not clearly visualized on prior studies. Suggest MRI abdomen without and with IV contrast for further characterization.   10/20/2019 - 12/29/2019 Chemotherapy   The patient had ondansetron (ZOFRAN) injection 8 mg, 8 mg (100 % of original dose 8 mg), Intravenous,  Once, 2 of 5 cycles Dose modification: 8 mg (original dose 8 mg,  Cycle 2) Administration: 8 mg (11/17/2019), 8 mg (12/15/2019), 8 mg (12/29/2019) gemcitabine (GEMZAR) 2,000 mg in sodium chloride 0.9 % 250 mL chemo infusion, 2,090 mg, Intravenous,  Once, 3 of 6 cycles Administration: 2,000 mg (10/20/2019), 2,000 mg (11/03/2019), 2,000 mg (11/17/2019), 2,000 mg (12/15/2019), 2,000 mg (12/29/2019)  for chemotherapy treatment.    06/12/2020 Imaging   1. Enlarging superficial, exophytic component of the chronic right sternocleidomastoid muscle mass. See series 6, image 55. 2. Elsewhere stable CT appearance of the Neck.   06/12/2020 Imaging   Post treatment scarring in the left hemithorax, stable. No evidence recurrent or metastatic disease   06/14/2020 -  Chemotherapy   The patient had palonosetron (ALOXI) injection 0.25 mg, 0.25 mg, Intravenous,  Once, 5 of 6 cycles Administration: 0.25 mg (06/14/2020), 0.25 mg (07/05/2020), 0.25 mg (07/26/2020), 0.25 mg (08/16/2020), 0.25 mg (09/06/2020) fosaprepitant (EMEND) 150 mg in sodium chloride 0.9 % 145 mL IVPB, 150 mg, Intravenous,  Once, 5 of 6 cycles  Administration: 150 mg (06/14/2020), 150 mg (07/05/2020), 150 mg (07/26/2020), 150 mg (08/16/2020), 150 mg (09/06/2020) pembrolizumab (KEYTRUDA) 200 mg in sodium chloride 0.9 % 50 mL chemo infusion, 200 mg (100 % of original dose 200 mg), Intravenous, Once, 5 of 6 cycles Dose modification: 200 mg (original dose 200 mg, Cycle 1, Reason: Provider Judgment) Administration: 200 mg (06/14/2020), 200 mg (07/05/2020), 200 mg (07/26/2020), 200 mg (08/16/2020), 200 mg (09/06/2020) fluorouracil (ADRUCIL) 6,750 mg in sodium chloride 0.9 % 115 mL chemo infusion, 800 mg/m2/day = 6,750 mg (100 % of original dose 800 mg/m2/day), Intravenous, 4D (96 hours ), 5 of 6 cycles Dose modification: 800 mg/m2/day (original dose 800 mg/m2/day, Cycle 1, Reason: Provider Judgment) Administration: 6,750 mg (06/14/2020), 6,750 mg (07/05/2020), 6,700 mg (07/26/2020), 6,700 mg (08/16/2020), 6,700 mg (09/06/2020) CARBOplatin  (PARAPLATIN) 750 mg in sodium chloride 0.9 % 250 mL chemo infusion, 750 mg (100 % of original dose 750 mg), Intravenous,  Once, 5 of 6 cycles Dose modification:   (original dose 750 mg, Cycle 1) Administration: 750 mg (06/14/2020), 730 mg (07/05/2020), 730 mg (07/26/2020), 590 mg (08/16/2020), 480 mg (09/06/2020)  for chemotherapy treatment.      REVIEW OF SYSTEMS:   Constitutional: Denies fevers, chills or abnormal weight loss Eyes: Denies blurriness of vision Ears, nose, mouth, throat, and face: Denies mucositis or sore throat Respiratory: Denies cough, dyspnea or wheezes Cardiovascular: Denies palpitation, chest discomfort or lower extremity swelling Skin: Denies abnormal skin rashes Lymphatics: Denies new lymphadenopathy or easy bruising Neurological:Denies numbness, tingling or new weaknesses Behavioral/Psych: Mood is stable, no new changes  All other systems were reviewed with the patient and are negative.  I have reviewed the past medical history, past surgical history, social history and family history with the patient and they are unchanged from previous note.  ALLERGIES:  is allergic to phenergan [promethazine hcl], heparin, and clindamycin.  MEDICATIONS:  Current Outpatient Medications  Medication Sig Dispense Refill  . gabapentin (NEURONTIN) 300 MG capsule Take 1 capsule (300 mg total) by mouth 2 (two) times daily. 60 capsule 11  . atorvastatin (LIPITOR) 40 MG tablet TK 1 T PO  D    . cetirizine (ZYRTEC) 10 MG tablet TAKE 1 TABLET(10 MG) BY MOUTH DAILY 30 tablet 1  . diclofenac Sodium (VOLTAREN) 1 % GEL Apply topically.    [place] [facility] levothyroxine (SYNTHROID) 137 MCG tablet Take 1 tablet (137 mcg total) by mouth daily before breakfast. 90 tablet 9  . LORazepam (ATIVAN) 1 MG tablet Take 1 tablet (1 mg total) by mouth every 8 (eight) hours as needed for anxiety. 30 tablet 0  . omeprazole (PRILOSEC) 40 MG capsule Take 1 capsule (40 mg total) by mouth daily. 30 capsule 9  . ondansetron  (ZOFRAN) 8 MG tablet Take 1 tablet (8 mg total) by mouth every 8 (eight) hours as needed for nausea or vomiting. 90 tablet 1  . oxyCODONE (ROXICODONE) 15 MG immediate release tablet Take 1 tablet (15 mg total) by mouth every 4 (four) hours as needed. 90 tablet 0  . PARoxetine (PAXIL) 20 MG tablet Take 1 tablet (20 mg total) by mouth daily. 30 tablet 5  . polyethylene glycol (MIRALAX) packet Take 17 g by mouth daily. (Patient taking differently: Take 17 g by mouth daily as needed for mild constipation or moderate constipation. ) 14 each 3  . senna-docusate (SENOKOT-S) 8.6-50 MG tablet Take 2 tablets by mouth 3 (three) times daily. 90 tablet 0  . Vitamin D, Ergocalciferol, (DRISDOL) 1.25 MG (50000 UT) CAPS  capsule TK 1 C PO WEEKLY     No current facility-administered medications for this visit.   Facility-Administered Medications Ordered in Other Visits  Medication Dose Route Frequency Provider Last Rate Last Admin  . anticoagulant sodium citrate solution 5 mL  5 mL Intracatheter Once Alvy Bimler, Aul Mangieri, MD      . anticoagulant sodium citrate solution 5 mL  5 mL Intracatheter Once Alvy Bimler, Puneet Masoner, MD        PHYSICAL EXAMINATION: ECOG PERFORMANCE STATUS: 1 - Symptomatic but completely ambulatory  Vitals:   10/11/20 1007  BP: (!) 151/81  Pulse: 95  Resp: 18  Temp: 97.6 F (36.4 C)  SpO2: 100%   Filed Weights   10/11/20 1007  Weight: 201 lb 6.4 oz (91.4 kg)    GENERAL:alert, no distress and comfortable SKIN: skin color, texture, turgor are normal, no rashes or significant lesions EYES: normal, Conjunctiva are pink and non-injected, sclera clear OROPHARYNX:no exudate, no erythema and lips, buccal mucosa, and tongue normal  NECK: The mass on the right side of his neck is getting smaller NEURO: alert & oriented x 3 with fluent speech, no focal motor/sensory deficits  LABORATORY DATA:  I have reviewed the data as listed    Component Value Date/Time   NA 139 10/11/2020 0957   NA 139 09/22/2017  0829   K 4.0 10/11/2020 0957   K 3.5 09/22/2017 0829   CL 103 10/11/2020 0957   CO2 26 10/11/2020 0957   CO2 26 09/22/2017 0829   GLUCOSE 203 (H) 10/11/2020 0957   GLUCOSE 133 09/22/2017 0829   BUN 11 10/11/2020 0957   BUN 14.1 09/22/2017 0829   CREATININE 1.01 10/11/2020 0957   CREATININE 0.93 02/06/2019 1215   CREATININE 0.9 09/22/2017 0829   CALCIUM 9.2 10/11/2020 0957   CALCIUM 9.1 09/22/2017 0829   PROT 7.1 10/11/2020 0957   PROT 6.8 09/22/2017 0829   ALBUMIN 4.0 10/11/2020 0957   ALBUMIN 4.1 09/22/2017 0829   AST 24 10/11/2020 0957   AST 31 02/06/2019 1215   AST 22 09/22/2017 0829   ALT 29 10/11/2020 0957   ALT 48 (H) 02/06/2019 1215   ALT 30 09/22/2017 0829   ALKPHOS 67 10/11/2020 0957   ALKPHOS 55 09/22/2017 0829   BILITOT 0.4 10/11/2020 0957   BILITOT 0.2 (L) 02/06/2019 1215   BILITOT 0.35 09/22/2017 0829   GFRNONAA >60 10/11/2020 0957   GFRNONAA >60 02/06/2019 1215   GFRAA >60 07/05/2020 0845   GFRAA >60 02/06/2019 1215    No results found for: SPEP, UPEP  Lab Results  Component Value Date   WBC 4.1 10/11/2020   NEUTROABS 2.0 10/11/2020   HGB 12.6 (L) 10/11/2020   HCT 37.4 (L) 10/11/2020   MCV 98.7 10/11/2020   PLT 231 10/11/2020      Chemistry      Component Value Date/Time   NA 139 10/11/2020 0957   NA 139 09/22/2017 0829   K 4.0 10/11/2020 0957   K 3.5 09/22/2017 0829   CL 103 10/11/2020 0957   CO2 26 10/11/2020 0957   CO2 26 09/22/2017 0829   BUN 11 10/11/2020 0957   BUN 14.1 09/22/2017 0829   CREATININE 1.01 10/11/2020 0957   CREATININE 0.93 02/06/2019 1215   CREATININE 0.9 09/22/2017 0829      Component Value Date/Time   CALCIUM 9.2 10/11/2020 0957   CALCIUM 9.1 09/22/2017 0829   ALKPHOS 67 10/11/2020 0957   ALKPHOS 55 09/22/2017 0829   AST 24 10/11/2020 0957  AST 31 02/06/2019 1215   AST 22 09/22/2017 0829   ALT 29 10/11/2020 0957   ALT 48 (H) 02/06/2019 1215   ALT 30 09/22/2017 0829   BILITOT 0.4 10/11/2020 0957   BILITOT  0.2 (L) 02/06/2019 1215   BILITOT 0.35 09/22/2017 9499

## 2020-10-11 NOTE — Assessment & Plan Note (Signed)
He stated he is compliant taking Synthroid I will check TSH again and will adjust the dose of medicine as needed

## 2020-10-12 ENCOUNTER — Inpatient Hospital Stay: Payer: Medicaid Other

## 2020-10-14 ENCOUNTER — Ambulatory Visit: Payer: Medicaid Other

## 2020-10-14 ENCOUNTER — Other Ambulatory Visit: Payer: Self-pay | Admitting: Hematology and Oncology

## 2020-10-15 ENCOUNTER — Other Ambulatory Visit: Payer: Self-pay

## 2020-10-15 ENCOUNTER — Telehealth: Payer: Self-pay

## 2020-10-15 ENCOUNTER — Inpatient Hospital Stay: Payer: Medicaid Other

## 2020-10-15 DIAGNOSIS — Z7189 Other specified counseling: Secondary | ICD-10-CM

## 2020-10-15 DIAGNOSIS — C760 Malignant neoplasm of head, face and neck: Secondary | ICD-10-CM

## 2020-10-15 DIAGNOSIS — Z5111 Encounter for antineoplastic chemotherapy: Secondary | ICD-10-CM | POA: Diagnosis not present

## 2020-10-15 MED ORDER — SODIUM CHLORIDE 0.9% FLUSH
10.0000 mL | INTRAVENOUS | Status: DC | PRN
  Administered 2020-10-15: 10 mL
  Filled 2020-10-15: qty 10

## 2020-10-15 NOTE — Telephone Encounter (Signed)
Patient did not arrive for 1100 pump d/c/IVF's appt. Called cell number listed in demographics. No answer. Left message for patient to call [place] with status.

## 2020-10-16 ENCOUNTER — Other Ambulatory Visit: Payer: Self-pay | Admitting: Hematology and Oncology

## 2020-10-16 DIAGNOSIS — Z7189 Other specified counseling: Secondary | ICD-10-CM

## 2020-10-16 DIAGNOSIS — C760 Malignant neoplasm of head, face and neck: Secondary | ICD-10-CM

## 2020-10-16 NOTE — Progress Notes (Signed)
OFF PATHWAY REGIMEN - Head and Neck  No Change  Continue With Treatment as Ordered.  Original Decision Date/Time: 06/03/2020 12:36   [id number]:Carboplatin IV D1 + Fluorouracil CIV D1-4 + Pembrolizumab IV D1 q21 Days (C1-6) Followed by Pembrolizumab IV D1 q21 Days:   A cycle is every 21 days:     Carboplatin      Fluorouracil      Pembrolizumab   **Always confirm dose/schedule in your pharmacy ordering system**  Patient Characteristics: Nasopharyngeal, Metastatic, Third Line & Beyond, TMB-L/Unknown Disease Classification: Nasopharyngeal Current Disease Status: Metastatic Disease AJCC T Category: [place] AJCC N Category: NX AJCC M Category: M1 AJCC 8 Stage Grouping: IVB Line of Therapy: Third Geophysical data processor Tumor Mutational Burden (TMB): TMB-L Intent of Therapy: Non-Curative / Palliative Intent, Discussed with Patient

## 2020-10-24 ENCOUNTER — Other Ambulatory Visit: Payer: Self-pay

## 2020-10-24 ENCOUNTER — Other Ambulatory Visit: Payer: Self-pay | Admitting: Hematology and Oncology

## 2020-10-24 ENCOUNTER — Telehealth: Payer: Self-pay

## 2020-10-24 MED ORDER — OXYCODONE HCL 15 MG PO TABS: 15.0000 mg | ORAL_TABLET | ORAL | 0 refills | Status: DC | PRN

## 2020-10-24 MED ORDER — MAGIC MOUTHWASH W/LIDOCAINE: 5.0000 mL | Freq: Four times a day (QID) | ORAL | 0 refills | Status: AC

## 2020-10-24 MED FILL — MAGIC MW (LID/NYS/MAA/BEN): 7 days supply | Qty: 240 | Fill #0

## 2020-10-24 MED FILL — oxyCODONE HCL 15 MG TABS: 15 | 15 days supply | Qty: 90 | Fill #0

## 2020-10-24 NOTE — Telephone Encounter (Signed)
Called and given below message. Rx sent to [facility] He verbalized understanding.

## 2020-10-24 NOTE — Telephone Encounter (Signed)
Called and given below message. He verbalized understanding. He will call radiology and scheduled scan now.  He is requesting a refill on Oxycodone Rx.  He is requesting magic mouth wash Rx. He has mouth blisters and is having a hard time eating.

## 2020-10-24 NOTE — Telephone Encounter (Signed)
-----   Message from [name], MD sent at 10/24/2020  8:28 AM EST ----- Regarding: CT not scheduled Can you call him and make sure he scheduled CT for 1/27? If not, tell him all his appt on 1/28 will be cancelled

## 2020-10-24 NOTE — Telephone Encounter (Signed)
I sent his refill to [facility] He needs to pick it up from [facility] and pls call in MMW with lidocaine swish and spit 4 x per day, for 7 days no reiflls

## 2020-10-31 ENCOUNTER — Encounter (HOSPITAL_COMMUNITY): Payer: Self-pay

## 2020-10-31 ENCOUNTER — Other Ambulatory Visit: Payer: Self-pay

## 2020-10-31 ENCOUNTER — Ambulatory Visit (HOSPITAL_COMMUNITY)
Admission: RE | Admit: 2020-10-31 | Discharge: 2020-10-31 | Disposition: A | Discharge status: Home / Self Care | Payer: Medicaid Other | Source: Ambulatory Visit | Attending: Hematology and Oncology | Admitting: Hematology and Oncology

## 2020-10-31 DIAGNOSIS — C760 Malignant neoplasm of head, face and neck: Secondary | ICD-10-CM | POA: Insufficient documentation

## 2020-10-31 MED ORDER — IOHEXOL 300 MG/ML  SOLN
75.0000 mL | Freq: Once | INTRAMUSCULAR | Status: AC | PRN
  Administered 2020-10-31: 75 mL via INTRAVENOUS

## 2020-11-01 ENCOUNTER — Inpatient Hospital Stay: Payer: Medicaid Other

## 2020-11-01 ENCOUNTER — Other Ambulatory Visit: Payer: Self-pay | Admitting: Hematology and Oncology

## 2020-11-01 ENCOUNTER — Other Ambulatory Visit (HOSPITAL_COMMUNITY): Payer: Self-pay | Admitting: Hematology and Oncology

## 2020-11-01 ENCOUNTER — Inpatient Hospital Stay (HOSPITAL_BASED_OUTPATIENT_CLINIC_OR_DEPARTMENT_OTHER): Payer: Medicaid Other | Admitting: Hematology and Oncology

## 2020-11-01 ENCOUNTER — Telehealth: Payer: Self-pay

## 2020-11-01 ENCOUNTER — Encounter: Payer: Self-pay | Admitting: Hematology and Oncology

## 2020-11-01 ENCOUNTER — Other Ambulatory Visit: Payer: Self-pay

## 2020-11-01 DIAGNOSIS — Z7189 Other specified counseling: Secondary | ICD-10-CM

## 2020-11-01 DIAGNOSIS — G893 Neoplasm related pain (acute) (chronic): Secondary | ICD-10-CM

## 2020-11-01 DIAGNOSIS — C760 Malignant neoplasm of head, face and neck: Secondary | ICD-10-CM | POA: Diagnosis not present

## 2020-11-01 DIAGNOSIS — Z5111 Encounter for antineoplastic chemotherapy: Secondary | ICD-10-CM | POA: Diagnosis not present

## 2020-11-01 DIAGNOSIS — E119 Type 2 diabetes mellitus without complications: Secondary | ICD-10-CM | POA: Diagnosis not present

## 2020-11-01 DIAGNOSIS — D61818 Other pancytopenia: Secondary | ICD-10-CM

## 2020-11-01 DIAGNOSIS — C7800 Secondary malignant neoplasm of unspecified lung: Secondary | ICD-10-CM

## 2020-11-01 DIAGNOSIS — E039 Hypothyroidism, unspecified: Secondary | ICD-10-CM | POA: Diagnosis not present

## 2020-11-01 DIAGNOSIS — Z95828 Presence of other vascular implants and grafts: Secondary | ICD-10-CM

## 2020-11-01 LAB — CBC WITH DIFFERENTIAL (CANCER CENTER ONLY)
Abs Immature Granulocytes: 0.01 10*3/uL (ref 0.00–0.07)
Basophils Absolute: 0 10*3/uL (ref 0.0–0.1)
Basophils Relative: 1 %
Eosinophils Absolute: 0.1 10*3/uL (ref 0.0–0.5)
Eosinophils Relative: 4 %
HCT: 37.1 % — ABNORMAL LOW (ref 39.0–52.0)
Hemoglobin: 12.4 g/dL — ABNORMAL LOW (ref 13.0–17.0)
Immature Granulocytes: 0 %
Lymphocytes Relative: 37 %
Lymphs Abs: 1.3 10*3/uL (ref 0.7–4.0)
MCH: 32.9 pg (ref 26.0–34.0)
MCHC: 33.4 g/dL (ref 30.0–36.0)
MCV: 98.4 fL (ref 80.0–100.0)
Monocytes Absolute: 0.4 10*3/uL (ref 0.1–1.0)
Monocytes Relative: 12 %
Neutro Abs: 1.6 10*3/uL — ABNORMAL LOW (ref 1.7–7.7)
Neutrophils Relative %: 46 %
Platelet Count: 194 10*3/uL (ref 150–400)
RBC: 3.77 MIL/uL — ABNORMAL LOW (ref 4.22–5.81)
RDW: 13.9 % (ref 11.5–15.5)
WBC Count: 3.6 10*3/uL — ABNORMAL LOW (ref 4.0–10.5)
nRBC: 0 % (ref 0.0–0.2)

## 2020-11-01 LAB — CMP (CANCER CENTER ONLY)
ALT: 23 U/L (ref 0–44)
AST: 24 U/L (ref 15–41)
Albumin: 3.7 g/dL (ref 3.5–5.0)
Alkaline Phosphatase: 61 U/L (ref 38–126)
Anion gap: 9 (ref 5–15)
BUN: 15 mg/dL (ref 6–20)
CO2: 25 mmol/L (ref 22–32)
Calcium: 8.9 mg/dL (ref 8.9–10.3)
Chloride: 105 mmol/L (ref 98–111)
Creatinine: 0.99 mg/dL (ref 0.61–1.24)
GFR, Estimated: >60 mL/min (ref >60)
Glucose, Bld: 206 mg/dL — ABNORMAL HIGH (ref 70–99)
Potassium: 3.7 mmol/L (ref 3.5–5.1)
Sodium: 139 mmol/L (ref 135–145)
Total Bilirubin: 0.3 mg/dL (ref 0.3–1.2)
Total Protein: 6.8 g/dL (ref 6.5–8.1)

## 2020-11-01 LAB — HEMOGLOBIN A1C
Hgb A1c MFr Bld: 5.9 % — ABNORMAL HIGH (ref 4.8–5.6)
Mean Plasma Glucose: 122.63 mg/dL

## 2020-11-01 LAB — TSH: TSH: 1.995 u[IU]/mL (ref 0.320–4.118)

## 2020-11-01 MED ORDER — METFORMIN HCL 500 MG PO TABS: 500.0000 mg | ORAL_TABLET | Freq: Two times a day (BID) | ORAL | 3 refills | Status: DC

## 2020-11-01 MED ORDER — SODIUM CHLORIDE 0.9% FLUSH
10.0000 mL | INTRAVENOUS | Status: DC | PRN
  Administered 2020-11-01: 10 mL
  Filled 2020-11-01: qty 10

## 2020-11-01 MED ORDER — SODIUM CHLORIDE 0.9 % IV SOLN
Freq: Once | INTRAVENOUS | Status: AC
  Filled 2020-11-01: qty 250

## 2020-11-01 MED ORDER — PEMBROLIZUMAB CHEMO INJECTION 100 MG/4ML
200.0000 mg | Freq: Once | INTRAVENOUS | Status: AC
  Administered 2020-11-01: 200 mg via INTRAVENOUS
  Filled 2020-11-01: qty 8

## 2020-11-01 MED ORDER — ANTICOAGULANT SODIUM CITRATE 4% (200MG/5ML) IV SOLN
5.0000 mL | Freq: Once | Status: AC
  Administered 2020-11-01: 5 mL
  Filled 2020-11-01: qty 5

## 2020-11-01 MED ORDER — SODIUM CHLORIDE 0.9% FLUSH
10.0000 mL | Freq: Once | INTRAVENOUS | Status: AC
  Administered 2020-11-01: 10 mL
  Filled 2020-11-01: qty 10

## 2020-11-01 MED FILL — METFORMIN HCL 500 MG TABS: 500 | 30 days supply | Qty: 60 | Fill #0

## 2020-11-01 NOTE — Assessment & Plan Note (Signed)
He has gained a lot of weight and is not seeing his primary care doctor for this His blood sugar is consistently high I will start him on Metformin and will check his hemoglobin A1c

## 2020-11-01 NOTE — Assessment & Plan Note (Signed)
The dose of oxycodone was recently increased to 15 mg to use as needed He can continue the same dose and to take additional ibuprofen as needed

## 2020-11-01 NOTE — Assessment & Plan Note (Signed)
I have reviewed CT imaging with the patient He has excellent response to therapy Per guidelines, We will continue pembrolizumab indefinitely

## 2020-11-01 NOTE — Assessment & Plan Note (Signed)
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

## 2020-11-01 NOTE — Progress Notes (Signed)
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[place]) I have reviewed CT imaging with the patient He has excellent response to therapy Per guidelines, We will continue pembrolizumab indefinitely  Cancer associated pain The dose of oxycodone was recently increased to 15 mg to use as needed He can continue the same dose and to take additional ibuprofen as needed  Acquired hypothyroidism He stated he is compliant taking Synthroid I will check TSH again and will adjust the dose of medicine as needed  Type 2 diabetes mellitus ([place]) He has gained a lot of weight and is not seeing his primary care doctor for this His blood sugar is consistently high I will start him on Metformin and will check his hemoglobin A1c  Pancytopenia, acquired ([place]) He has mild persistent pancytopenia from prior treatment He is not symptomatic Observe closely for now   No orders of the defined types were placed in this encounter.   All questions were answered. The patient knows to call the clinic with any problems, questions or concerns. The total time spent in the appointment was 30 minutes encounter with patients including review of chart and various tests results, discussions about plan of care and coordination of care plan   [name], MD 11/01/2020 10:45 AM  INTERVAL HISTORY: Please see below for problem oriented charting. 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 Pan-endoscopy elsewhere showed cancer from nasopharyngeal space.   01/04/2007 - 02/20/2007 Chemotherapy   He received 2 cycles of cisplatin and 5FU followed by concurrent chemo with weekly cisplatin and radiation. He only received 2 doses of chemo due to severe mucositis, nausea and weight loss.   04/05/2007 - 08/04/2007 Chemotherapy   He received 4 more courses of cisplatin with 5FU and had complete response   07/05/2009 Procedure   Fine-needle aspirate of the right level II lymph nodes come from recurrent metastatic disease. Repeat endoscopy and CT scan show no evidence of disease elsewhere.   07/08/2009 - 12/02/2009 Chemotherapy   He was given 6 cycles of carboplatin, 5-FU and docetaxel   12/03/2009 Surgery   He has surgery to the residual lymph node on the right neck which showed no evidence of disease.   02/22/2012 Imaging   Repeat imaging study showed large recurrent mass. He was referred elsewhere for further treatment.   05/03/2012 Surgery   He underwent left upper lobectomy.   04/29/2013 Imaging  PEt scan showed lesion on right level II B and lower lung was abnormal   06/03/2013 - 02/02/2014 Chemotherapy   He had 6 cycles of chemotherapy when he was found to have recurrence of cancer and had received oxaliplatin and capecitabine   06/07/2014 Imaging   PET CT scan showed persistent disease in the right neck lymph  nodes and left lung   06/29/2014 Procedure   Accession: [id number] repeat LUL biopsy confirmed metastatic cancer   07/18/2014 - 07/31/2014 Radiation Therapy   He received palliative radiation therapy to the lungs   10/10/2014 Imaging   CT scan of the chest, abdomen and pelvis show regression in the size of the lung nodule in the left upper lobe and stable pulmonary nodules   01/24/2015 Imaging   CT scan showed stable disease in neck and lung   06/19/2015 Imaging   CT scan of the neck and the chest show possible mild progression of the nodule in the right side of the neck.   06/25/2015 Imaging   PET scan confirmed disease recurrence in the neck   07/07/2015 Imaging   He had MRI neck at [facility]   09/03/2015 - 08/26/2018 Chemotherapy   He received palliative chemo with Nivolumab   10/29/2015 Imaging   PET CT showed positive response to Rx   02/28/2016 Imaging   Ct abdomen showed abnormal thinkening in his stomach   03/03/2016 Imaging   CT: Right sternocleidomastoid muscle metastasis appears less distinct but otherwise not significantly changed in size or configuration since 06/19/2015.2. Left level 3 lymph node which was hypermetabolic by PET-CT in January 2017 appears slightly smaller   04/01/2016 Imaging   CT cervical spine showed no acute fracture or traumatic malalignment in the cervical spine   04/22/2016 Procedure   Port-a-cath placed.   06/16/2016 Imaging   Ct neck showed right sternocleidomastoid muscle metastasis is further decreased in conspicuity since May, and has mildly decreased in size since September 2016. Continued stability of sub-centimeter left cervical lymph nodes. No new or progressive metastatic disease in the neck.   06/16/2016 Imaging   CT chest showed stable masslike radiation fibrosis in the left upper lobe. Stable subcentimeter pulmonary nodules in the bilateral lower lobes. No new or progressive metastatic disease in the chest. Nonobstructing left renal stone.    10/13/2016 Imaging   Ct neck showed unchanged right sternocleidomastoid muscle metastasis. Unchanged subcentimeter left cervical lymph nodes. No evidence of new or progressive metastatic disease in the neck.   10/13/2016 Imaging   CT chest showed tiny hypervascular foci in the liver, not definitely seen on prior imaging of 06/16/2016 and 02/28/2016. Abdomen MRI without and with contrast recommended to further evaluate as metastatic disease is a concern. 2. Stable appearance of post treatment changes left upper lung and scattered tiny bilateral pulmonary nodules.   02/11/2017 Imaging   Ct neck: Lymph node mass right posterior neck appears improved from the prior study. Small posterior lymph nodes on the left unchanged. Occluded right jugular vein unchanged.   02/11/2017 Imaging   1. Similar appearance of postsurgical and radiation changes in the left upper lobe. 2. Similar bilateral pulmonary nodules. 3. No thoracic adenopathy. 4. Subtle foci of post-contrast enhancement within the liver are suboptimally characterized on this nondedicated study. Likely similar. These could either be re-evaluated at followup or more entirely characterized with abdominal MRI. 5. Left nephrolithiasis.   05/19/2017 Imaging   Matted lymph node mass right posterior neck appears larger in the recent CT. Accurate measurements difficult  due to infiltrating tumor margins and infiltration of the muscle. Right jugular vein again appears occluded or resected. Small left posterior lymph nodes stable. Left upper lobe airspace density stable and similar to the prior CT   06/03/2017 PET scan   1. Hypermetabolic ill-defined right level IIb lymph node, about 1.3 cm in diameter with maximum SUV 9.5 (formerly 8.1). Appearance suspicious for residual/recurrent malignancy. No worrisome left-sided lesion. 2. Left suprahilar indistinct opacity demonstrates no worrisome hypermetabolic activity. The 5 mm left lower lobe pulmonary nodule is  stable and not currently hypermetabolic although below sensitive PET-CT size thresholds. 3. Other imaging findings of potential clinical significance: Bilateral nonobstructive nephrolithiasis. Chronic bilateral maxillary sinusitis.   05/11/2018 PET scan   1. Continued chronic accentuated metabolic activity in the vicinity of right level IIB and the adjacent right sternocleidomastoid muscle, with ill definition of surrounding tissue planes. Maximum SUV is currently 8.1, formerly 9.5. Accentuated metabolic activity is been present in this vicinity back through 06/25/2015, and there was also some low-level activity in this vicinity on 06/07/2014. Some of this may be from scarring and local muscular activity although clearly a component of residual tumor is difficult to exclude given the focally high activity. 2. Other imaging findings of potential clinical significance: Chronic bilateral maxillary sinusitis. Chronic scarring in the left upper lobe. Chronically stable 5 mm left lower lobe nodule is considered benign. Nonobstructive left nephrolithiasis.   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 COMMENT:No significant keratinization is identified. Other basaloid carcinomas are in the differential diagnosis. No cell block material is available for further testing.   09/12/2018 Procedure   He underwent fine Needle Aspiration   10/04/2018 PET scan   1. Significant progression of local recurrence laterally in the mid right neck with an enlarging, increasingly hypermetabolic soft tissue mass. This involves the right sternocleidomastoid muscle. 2. Small lymph nodes in the right axilla are increasingly hypermetabolic. These are nonspecific and potentially reactive, although could reflect a small metastases. Small hypermetabolic nodule in the left suprasternal notch  is unchanged. 3. No other evidence of metastatic disease.    10/07/2018 - 12/23/2018 Chemotherapy   The patient had cisplatin plus gemzar   12/07/2018 Imaging   1. Decreased size of lateral right neck mass. 2. Unchanged soft tissue nodule in the suprasternal notch. 3. No evidence of new metastatic disease in the neck.    05/30/2019 Imaging   CT neck No clear change or progression compared to the study of March. Overall measurements of the right lateral neck mass are similar, approximately 3 x 1.8 cm. See above discussion. One could argue that there is slight increase in lateral bulging, possibly with an increase in contrast enhancement, towards the inferior margin. This is of questionable validity but could possibly represent some progression or inflammatory change. Other findings in the region are stable.   07/20/2019 - 09/15/2019 Chemotherapy   The patient had dexamethasone (DECADRON) 4 MG tablet, 1 of 1 cycle, Start date: --, End date: -- palonosetron (ALOXI) injection 0.25 mg, 0.25 mg, Intravenous,  Once, 3 of 4 cycles Administration: 0.25 mg (07/20/2019), 0.25 mg (08/10/2019), 0.25 mg (09/08/2019) CISplatin (PLATINOL) 84 mg in sodium chloride 0.9 % 250 mL chemo infusion, 40 mg/m2 = 84 mg (80 % of original dose 50 mg/m2), Intravenous,  Once, 3 of 4 cycles Dose modification: 40 mg/m2 (80 % of original dose 50 mg/m2, Cycle 1,  Reason: Dose Not Tolerated) Administration: 84 mg (07/20/2019), 84 mg (08/10/2019), 83 mg (09/08/2019) gemcitabine (GEMZAR) 1,600 mg in sodium chloride 0.9 % 250 mL chemo infusion, 1,672 mg (80 % of original dose 1,000 mg/m2), Intravenous,  Once, 3 of 4 cycles Dose modification: 800 mg/m2 (80 % of original dose 1,000 mg/m2, Cycle 1, Reason: Provider Judgment) Administration: 1,600 mg (07/20/2019), 1,600 mg (07/27/2019), 1,600 mg (08/10/2019), 1,600 mg (08/17/2019), 1,600 mg (09/08/2019), 1,672 mg (09/15/2019) ondansetron (ZOFRAN) 8 mg, dexamethasone (DECADRON) 10 mg in sodium  chloride 0.9 % 50 mL IVPB, , Intravenous,  Once, 3 of 4 cycles Administration:  (09/15/2019) fosaprepitant (EMEND) 150 mg, dexamethasone (DECADRON) 12 mg in sodium chloride 0.9 % 145 mL IVPB, , Intravenous,  Once, 3 of 4 cycles Administration:  (07/20/2019),  (08/10/2019),  (09/08/2019)  for chemotherapy treatment.    10/02/2019 Imaging   CT neck As compared to 05/30/2019, no significant interval change in size of an ill-defined mass within the right lateral neck, again measuring 3.3 x 1.8 cm in transaxial dimensions.   Unchanged mildly enlarged left level I lymph node measuring 1.1 cm in short axis.   Unchanged node or nodule at the thoracic inlet, measuring 1.3 x 0.8 cm.   Please refer to concurrently performed chest CT for a description of findings below the level of the thoracic inlet.     10/02/2019 Imaging   CT chest 1. No new or progressive findings in the chest to suggest metastatic disease. 2. Bilateral subcentimeter solid pulmonary nodules are stable since 2018. 3. Hyperdense 1.1 cm anterior liver focus, not clearly visualized on prior studies. Suggest MRI abdomen without and with IV contrast for further characterization.   10/20/2019 - 12/29/2019 Chemotherapy   The patient had ondansetron (ZOFRAN) injection 8 mg, 8 mg (100 % of original dose 8 mg), Intravenous,  Once, 2 of 5 cycles Dose modification: 8 mg (original dose 8 mg, Cycle 2) Administration: 8 mg (11/17/2019), 8 mg (12/15/2019), 8 mg (12/29/2019) gemcitabine (GEMZAR) 2,000 mg in sodium chloride 0.9 % 250 mL chemo infusion, 2,090 mg, Intravenous,  Once, 3 of 6 cycles Administration: 2,000 mg (10/20/2019), 2,000 mg (11/03/2019), 2,000 mg (11/17/2019), 2,000 mg (12/15/2019), 2,000 mg (12/29/2019)  for chemotherapy treatment.    06/12/2020 Imaging   1. Enlarging superficial, exophytic component of the chronic right sternocleidomastoid muscle mass. See series 6, image 55. 2. Elsewhere stable CT appearance of the Neck.   06/12/2020  Imaging   Post treatment scarring in the left hemithorax, stable. No evidence recurrent or metastatic disease   06/14/2020 - 10/15/2020 Chemotherapy   He received carboplatin, 5FU and Beryle Flock       10/31/2020 Procedure   Interval improvement in right lateral lymph node mass. Improvement in dermal component as well as invasion of the right sternocleidomastoid muscle.   10 mm submental lymph node slightly enlarged compared to the prior study. Continued follow-up recommended.   11/01/2020 -  Chemotherapy    Patient is on Treatment Plan: HEAD/NECK PEMBROLIZUMAB Q21D        REVIEW OF SYSTEMS:   Constitutional: Denies fevers, chills or abnormal weight loss Eyes: Denies blurriness of vision Ears, nose, mouth, throat, and face: Denies mucositis or sore throat Respiratory: Denies cough, dyspnea or wheezes Cardiovascular: Denies palpitation, chest discomfort or lower extremity swelling Gastrointestinal:  Denies nausea, heartburn or change in bowel habits Skin: Denies abnormal skin rashes Lymphatics: Denies new lymphadenopathy or easy bruising Neurological:Denies numbness, tingling or new weaknesses Behavioral/Psych: Mood is stable, no new changes  All other systems were reviewed with the patient and are negative.  I have reviewed the past medical history, past surgical history, social history and family history with the patient and they are unchanged from previous note.  ALLERGIES:  is allergic to phenergan [promethazine hcl], heparin, and clindamycin.  MEDICATIONS:  Current Outpatient Medications  Medication Sig Dispense Refill  . atorvastatin (LIPITOR) 40 MG tablet TK 1 T PO  D    . cetirizine (ZYRTEC) 10 MG tablet TAKE 1 TABLET(10 MG) BY MOUTH DAILY 30 tablet 1  . diclofenac Sodium (VOLTAREN) 1 % GEL Apply topically.    . gabapentin (NEURONTIN) 300 MG capsule Take 1 capsule (300 mg total) by mouth 2 (two) times daily. 60 capsule 11  . levothyroxine (SYNTHROID) 137 MCG tablet Take 1  tablet (137 mcg total) by mouth daily before breakfast. 90 tablet 9  . LORazepam (ATIVAN) 1 MG tablet Take 1 tablet (1 mg total) by mouth every 8 (eight) hours as needed for anxiety. 30 tablet 0  . metFORMIN (GLUCOPHAGE) 500 MG tablet Take 1 tablet (500 mg total) by mouth 2 (two) times daily with a meal. 60 tablet 3  . omeprazole (PRILOSEC) 40 MG capsule Take 1 capsule (40 mg total) by mouth daily. 30 capsule 9  . ondansetron (ZOFRAN) 8 MG tablet Take 1 tablet (8 mg total) by mouth every 8 (eight) hours as needed for nausea or vomiting. 90 tablet 1  . oxyCODONE (ROXICODONE) 15 MG immediate release tablet Take 1 tablet (15 mg total) by mouth every 4 (four) hours as needed. 90 tablet 0  . PARoxetine (PAXIL) 20 MG tablet Take 1 tablet (20 mg total) by mouth daily. 30 tablet 5  . polyethylene glycol (MIRALAX) packet Take 17 g by mouth daily. (Patient taking differently: Take 17 g by mouth daily as needed for mild constipation or moderate constipation. ) 14 each 3  . senna-docusate (SENOKOT-S) 8.6-50 MG tablet Take 2 tablets by mouth 3 (three) times daily. 90 tablet 0  . Vitamin D, Ergocalciferol, (DRISDOL) 1.25 MG (50000 UT) CAPS capsule TK 1 C PO WEEKLY     No current facility-administered medications for this visit.   Facility-Administered Medications Ordered in Other Visits  Medication Dose Route Frequency Provider Last Rate Last Admin  . anticoagulant sodium citrate solution 5 mL  5 mL Intracatheter Once Alvy Bimler, Garren Greenman, MD      . anticoagulant sodium citrate solution 5 mL  5 mL Intracatheter Once Alvy Bimler, Elek Holderness, MD      . anticoagulant sodium citrate solution 5 mL  5 mL Intracatheter Once Alvy Bimler, Purcell Jungbluth, MD      . pembrolizumab (KEYTRUDA) 200 mg in sodium chloride 0.9 % 50 mL chemo infusion  200 mg Intravenous Once Kohei Antonellis, MD      . sodium chloride flush (NS) 0.9 % injection 10 mL  10 mL Intracatheter PRN Alvy Bimler, Henrik Orihuela, MD        PHYSICAL EXAMINATION: ECOG PERFORMANCE STATUS: 1 - Symptomatic but  completely ambulatory  Vitals:   11/01/20 0948  BP: 118/78  Pulse: 84  Resp: 18  Temp: (!) 97.5 F (36.4 C)  SpO2: 100%   Filed Weights   11/01/20 0948  Weight: 198 lb (89.8 kg)    GENERAL:alert, no distress and comfortable NECK: The tumor on the right side of his neck is almost completely resolved NEURO: alert & oriented x 3 with fluent speech, no focal motor/sensory deficits  LABORATORY DATA:  I have reviewed the data as listed  Component Value Date/Time   NA 139 11/01/2020 0917   NA 139 09/22/2017 0829   K 3.7 11/01/2020 0917   K 3.5 09/22/2017 0829   CL 105 11/01/2020 0917   CO2 25 11/01/2020 0917   CO2 26 09/22/2017 0829   GLUCOSE 206 (H) 11/01/2020 0917   GLUCOSE 133 09/22/2017 0829   BUN 15 11/01/2020 0917   BUN 14.1 09/22/2017 0829   CREATININE 0.99 11/01/2020 0917   CREATININE 0.9 09/22/2017 0829   CALCIUM 8.9 11/01/2020 0917   CALCIUM 9.1 09/22/2017 0829   PROT 6.8 11/01/2020 0917   PROT 6.8 09/22/2017 0829   ALBUMIN 3.7 11/01/2020 0917   ALBUMIN 4.1 09/22/2017 0829   AST 24 11/01/2020 0917   AST 22 09/22/2017 0829   ALT 23 11/01/2020 0917   ALT 30 09/22/2017 0829   ALKPHOS 61 11/01/2020 0917   ALKPHOS 55 09/22/2017 0829   BILITOT 0.3 11/01/2020 0917   BILITOT 0.35 09/22/2017 0829   GFRNONAA >60 11/01/2020 0917   GFRAA >60 07/05/2020 0845   GFRAA >60 02/06/2019 1215    No results found for: SPEP, UPEP  Lab Results  Component Value Date   WBC 3.6 (L) 11/01/2020   NEUTROABS 1.6 (L) 11/01/2020   HGB 12.4 (L) 11/01/2020   HCT 37.1 (L) 11/01/2020   MCV 98.4 11/01/2020   PLT 194 11/01/2020      Chemistry      Component Value Date/Time   NA 139 11/01/2020 0917   NA 139 09/22/2017 0829   K 3.7 11/01/2020 0917   K 3.5 09/22/2017 0829   CL 105 11/01/2020 0917   CO2 25 11/01/2020 0917   CO2 26 09/22/2017 0829   BUN 15 11/01/2020 0917   BUN 14.1 09/22/2017 0829   CREATININE 0.99 11/01/2020 0917   CREATININE 0.9 09/22/2017 0829       Component Value Date/Time   CALCIUM 8.9 11/01/2020 0917   CALCIUM 9.1 09/22/2017 0829   ALKPHOS 61 11/01/2020 0917   ALKPHOS 55 09/22/2017 0829   AST 24 11/01/2020 0917   AST 22 09/22/2017 0829   ALT 23 11/01/2020 0917   ALT 30 09/22/2017 0829   BILITOT 0.3 11/01/2020 0917   BILITOT 0.35 09/22/2017 0829       RADIOGRAPHIC STUDIES: I have reviewed CT imaging with the patient I have personally reviewed the radiological images as listed and agreed with the findings in the report. CT Soft Tissue Neck W Contrast  Result Date: 10/31/2020 CLINICAL DATA:  Nasopharyngeal carcinoma. Assess response to treatment. EXAM: CT NECK WITH CONTRAST TECHNIQUE: Multidetector CT imaging of the neck was performed using the standard protocol following the bolus administration of intravenous contrast. CONTRAST:  74mL OMNIPAQUE IOHEXOL 300 MG/ML  SOLN COMPARISON:  CT neck 06/12/2020 FINDINGS: Pharynx and larynx: No recurrent mass. Mild soft tissue thickening in the nasopharynx likely due to radiation change. Larynx and airway intact. Salivary glands: Normal parotid. Atrophic or surgically absent right submandibular gland unchanged. Small left submandibular gland without focal abnormality. Thyroid: Diminutive thyroid without focal abnormality Lymph nodes: Right lateral neck mass has improved in the interval. The mass infiltrates the right sternocleidomastoid muscle and extend to the skin surface. The dermal component of the mass also improved in the interval. Mass has ill-defined margins and difficult to measure however estimated size is 33 x 20 mm. Submental lymph node in the midline measures 10 mm slightly increased from the prior study. Lymph node in the sternal notch region measures 7 mm unchanged. No  enlarged lymph nodes in the left neck. Vascular: Right jugular vein appears chronically occluded. Right jugular Port-A-Cath noted. Limited intracranial: Negative Visualized orbits: Negative Mastoids and visualized  paranasal sinuses: Mucosal edema in the maxillary sinus bilaterally. Cyst left maxillary sinus. Mastoid clear bilaterally. Skeleton: No acute skeletal abnormality. Upper chest: Left upper lobe density compatible scarring unchanged. No new findings. Other: None IMPRESSION: Interval improvement in right lateral lymph node mass. Improvement in dermal component as well as invasion of the right sternocleidomastoid muscle. 10 mm submental lymph node slightly enlarged compared to the prior study. Continued follow-up recommended. Electronically Signed   By: Franchot Gallo M.D.   On: 10/31/2020 09:12

## 2020-11-01 NOTE — Patient Instructions (Signed)
[facility] Discharge Instructions for Patients Receiving Chemotherapy  Today you received the following chemotherapy agents: Pembrolizumab Beryle Flock)  To help prevent nausea and vomiting after your treatment, we encourage you to take your nausea medication  as prescribed.    If you develop nausea and vomiting that is not controlled by your nausea medication, call the clinic.   BELOW ARE SYMPTOMS THAT SHOULD BE REPORTED IMMEDIATELY:  *FEVER GREATER THAN 100.5 F  *CHILLS WITH OR WITHOUT FEVER  NAUSEA AND VOMITING THAT IS NOT CONTROLLED WITH YOUR NAUSEA MEDICATION  *UNUSUAL SHORTNESS OF BREATH  *UNUSUAL BRUISING OR BLEEDING  TENDERNESS IN MOUTH AND THROAT WITH OR WITHOUT PRESENCE OF ULCERS  *URINARY PROBLEMS  *BOWEL PROBLEMS  UNUSUAL RASH Items with * indicate a potential emergency and should be followed up as soon as possible.  Feel free to call the clinic should you have any questions or concerns. The clinic phone number is [phone].  Please show the [place] at check-in to the Emergency Department and triage nurse.

## 2020-11-01 NOTE — Telephone Encounter (Signed)
Given a copy of today's lab results. Glucose 206. He has not seen a PCP in years. He will pick Rx for diabetes medication at [facility] outpatient pharmacy today.

## 2020-11-01 NOTE — Assessment & Plan Note (Signed)
He stated he is compliant taking Synthroid I will check TSH again and will adjust the dose of medicine as needed

## 2020-11-02 LAB — T4: T4, Total: 6 ug/dL (ref 4.5–12.0)

## 2020-11-11 ENCOUNTER — Other Ambulatory Visit: Payer: Self-pay | Admitting: Hematology and Oncology

## 2020-11-11 ENCOUNTER — Other Ambulatory Visit: Payer: Self-pay | Admitting: *Deleted

## 2020-11-11 MED ORDER — OXYCODONE HCL 15 MG PO TABS: 15.0000 mg | ORAL_TABLET | ORAL | 0 refills | Status: DC | PRN

## 2020-11-11 MED ORDER — GLUCOSE BLOOD VI STRP: ORAL_STRIP | 12 refills | Status: DC

## 2020-11-11 MED ORDER — ACCU-CHEK SOFTCLIX LANCETS MISC: 12 refills | Status: DC

## 2020-11-11 MED ORDER — BLOOD GLUCOSE MONITOR KIT: PACK | 1 refills | Status: DC

## 2020-11-11 MED FILL — oxyCODONE HCL 15 MG TABS: 15 | 15 days supply | Qty: 90 | Fill #0

## 2020-11-12 ENCOUNTER — Other Ambulatory Visit (HOSPITAL_COMMUNITY): Payer: Self-pay | Admitting: Hematology and Oncology

## 2020-11-12 MED FILL — ACCU-CHEK SOFTCLIX LANCETS: 100 days supply | Qty: 100 | Fill #0

## 2020-11-12 MED FILL — ACCU-CHEK GUIDE W/DEVICE KI: W/DEVICE | 30 days supply | Qty: 1 | Fill #0

## 2020-11-12 MED FILL — ACCU-CHEK GUIDE TEST STRIP: 100 days supply | Qty: 100 | Fill #0

## 2020-11-19 ENCOUNTER — Telehealth: Payer: Self-pay

## 2020-11-19 NOTE — Telephone Encounter (Signed)
He called and left a message to cancel 2/17 appts. His wife is sick and he is not going to be able to come to appts.  Called back. Scheduling message sent to reschedule to next week on 2/24 per his request.

## 2020-11-21 ENCOUNTER — Other Ambulatory Visit: Payer: Medicaid Other

## 2020-11-21 ENCOUNTER — Ambulatory Visit: Payer: Medicaid Other

## 2020-11-21 ENCOUNTER — Ambulatory Visit: Payer: Medicaid Other | Admitting: Hematology and Oncology

## 2020-11-28 ENCOUNTER — Other Ambulatory Visit: Payer: Medicaid Other

## 2020-11-28 ENCOUNTER — Ambulatory Visit: Payer: Medicaid Other

## 2020-11-28 ENCOUNTER — Ambulatory Visit: Payer: Medicaid Other | Admitting: Hematology and Oncology

## 2020-11-29 ENCOUNTER — Inpatient Hospital Stay: Payer: Medicaid Other

## 2020-11-29 ENCOUNTER — Inpatient Hospital Stay: Payer: Medicaid Other | Attending: Hematology and Oncology

## 2020-11-29 ENCOUNTER — Other Ambulatory Visit: Payer: Self-pay | Admitting: Hematology and Oncology

## 2020-11-29 ENCOUNTER — Inpatient Hospital Stay (HOSPITAL_BASED_OUTPATIENT_CLINIC_OR_DEPARTMENT_OTHER): Payer: Medicaid Other | Admitting: Hematology and Oncology

## 2020-11-29 ENCOUNTER — Other Ambulatory Visit: Payer: Self-pay

## 2020-11-29 ENCOUNTER — Encounter: Payer: Self-pay | Admitting: Hematology and Oncology

## 2020-11-29 ENCOUNTER — Other Ambulatory Visit (HOSPITAL_COMMUNITY): Payer: Self-pay | Admitting: Hematology and Oncology

## 2020-11-29 DIAGNOSIS — E119 Type 2 diabetes mellitus without complications: Secondary | ICD-10-CM | POA: Diagnosis not present

## 2020-11-29 DIAGNOSIS — Z7189 Other specified counseling: Secondary | ICD-10-CM

## 2020-11-29 DIAGNOSIS — Z452 Encounter for adjustment and management of vascular access device: Secondary | ICD-10-CM | POA: Insufficient documentation

## 2020-11-29 DIAGNOSIS — C7800 Secondary malignant neoplasm of unspecified lung: Secondary | ICD-10-CM

## 2020-11-29 DIAGNOSIS — C760 Malignant neoplasm of head, face and neck: Secondary | ICD-10-CM

## 2020-11-29 DIAGNOSIS — Z95828 Presence of other vascular implants and grafts: Secondary | ICD-10-CM

## 2020-11-29 DIAGNOSIS — G893 Neoplasm related pain (acute) (chronic): Secondary | ICD-10-CM

## 2020-11-29 DIAGNOSIS — Z5112 Encounter for antineoplastic immunotherapy: Secondary | ICD-10-CM | POA: Diagnosis present

## 2020-11-29 DIAGNOSIS — Z79899 Other long term (current) drug therapy: Secondary | ICD-10-CM | POA: Diagnosis not present

## 2020-11-29 DIAGNOSIS — E039 Hypothyroidism, unspecified: Secondary | ICD-10-CM

## 2020-11-29 DIAGNOSIS — C119 Malignant neoplasm of nasopharynx, unspecified: Secondary | ICD-10-CM | POA: Diagnosis present

## 2020-11-29 LAB — CMP (CANCER CENTER ONLY)
ALT: 26 U/L (ref 0–44)
AST: 21 U/L (ref 15–41)
Albumin: 4.1 g/dL (ref 3.5–5.0)
Alkaline Phosphatase: 64 U/L (ref 38–126)
Anion gap: 13 (ref 5–15)
BUN: 12 mg/dL (ref 6–20)
CO2: 23 mmol/L (ref 22–32)
Calcium: 9.2 mg/dL (ref 8.9–10.3)
Chloride: 103 mmol/L (ref 98–111)
Creatinine: 0.97 mg/dL (ref 0.61–1.24)
GFR, Estimated: >60 mL/min (ref >60)
Glucose, Bld: 194 mg/dL — ABNORMAL HIGH (ref 70–99)
Potassium: 3.7 mmol/L (ref 3.5–5.1)
Sodium: 139 mmol/L (ref 135–145)
Total Bilirubin: 0.3 mg/dL (ref 0.3–1.2)
Total Protein: 7 g/dL (ref 6.5–8.1)

## 2020-11-29 LAB — CBC WITH DIFFERENTIAL (CANCER CENTER ONLY)
Abs Immature Granulocytes: 0.01 10*3/uL (ref 0.00–0.07)
Basophils Absolute: 0 10*3/uL (ref 0.0–0.1)
Basophils Relative: 1 %
Eosinophils Absolute: 0.3 10*3/uL (ref 0.0–0.5)
Eosinophils Relative: 7 %
HCT: 41 % (ref 39.0–52.0)
Hemoglobin: 13.8 g/dL (ref 13.0–17.0)
Immature Granulocytes: 0 %
Lymphocytes Relative: 28 %
Lymphs Abs: 1.3 10*3/uL (ref 0.7–4.0)
MCH: 31.4 pg (ref 26.0–34.0)
MCHC: 33.7 g/dL (ref 30.0–36.0)
MCV: 93.2 fL (ref 80.0–100.0)
Monocytes Absolute: 0.4 10*3/uL (ref 0.1–1.0)
Monocytes Relative: 10 %
Neutro Abs: 2.5 10*3/uL (ref 1.7–7.7)
Neutrophils Relative %: 54 %
Platelet Count: 202 10*3/uL (ref 150–400)
RBC: 4.4 MIL/uL (ref 4.22–5.81)
RDW: 13.1 % (ref 11.5–15.5)
WBC Count: 4.5 10*3/uL (ref 4.0–10.5)
nRBC: 0 % (ref 0.0–0.2)

## 2020-11-29 LAB — TSH: TSH: 1.601 u[IU]/mL (ref 0.320–4.118)

## 2020-11-29 MED ORDER — LEVOTHYROXINE SODIUM 137 MCG PO TABS: 137.0000 ug | ORAL_TABLET | Freq: Every day | ORAL | 9 refills | Status: DC

## 2020-11-29 MED ORDER — SODIUM CHLORIDE 0.9 % IV SOLN
Freq: Once | INTRAVENOUS | Status: AC
  Filled 2020-11-29: qty 250

## 2020-11-29 MED ORDER — OXYCODONE HCL 15 MG PO TABS: 15.0000 mg | ORAL_TABLET | ORAL | 0 refills | Status: DC | PRN

## 2020-11-29 MED ORDER — SODIUM CHLORIDE 0.9% FLUSH
10.0000 mL | INTRAVENOUS | Status: DC | PRN
  Administered 2020-11-29: 10 mL
  Filled 2020-11-29: qty 10

## 2020-11-29 MED ORDER — ALTEPLASE 2 MG IJ SOLR
2.0000 mg | Freq: Once | INTRAMUSCULAR | Status: AC
  Administered 2020-11-29: 2 mg
  Filled 2020-11-29: qty 2

## 2020-11-29 MED ORDER — PEMBROLIZUMAB CHEMO INJECTION 100 MG/4ML
200.0000 mg | Freq: Once | INTRAVENOUS | Status: AC
  Administered 2020-11-29: 200 mg via INTRAVENOUS
  Filled 2020-11-29: qty 8

## 2020-11-29 MED ORDER — ANTICOAGULANT SODIUM CITRATE 4% (200MG/5ML) IV SOLN
5.0000 mL | Freq: Once | Status: AC
  Administered 2020-11-29: 5 mL
  Filled 2020-11-29: qty 5

## 2020-11-29 MED ORDER — ALTEPLASE 2 MG IJ SOLR
INTRAMUSCULAR | Status: AC
  Filled 2020-11-29: qty 2

## 2020-11-29 MED ORDER — ONDANSETRON HCL 4 MG/2ML IJ SOLN
8.0000 mg | Freq: Once | INTRAMUSCULAR | Status: AC
  Administered 2020-11-29: 8 mg via INTRAVENOUS

## 2020-11-29 MED ORDER — ONDANSETRON HCL 4 MG/2ML IJ SOLN
INTRAMUSCULAR | Status: AC
  Filled 2020-11-29: qty 4

## 2020-11-29 MED ORDER — SODIUM CHLORIDE 0.9% FLUSH
10.0000 mL | INTRAVENOUS | Status: DC | PRN
  Administered 2020-11-29: 10 mL via INTRAVENOUS
  Filled 2020-11-29: qty 10

## 2020-11-29 MED FILL — LEVOTHYROXINE 137 MCG TABLE: 137 | 90 days supply | Qty: 90 | Fill #0

## 2020-11-29 MED FILL — oxyCODONE HCL 15 MG TABS: 15 | 15 days supply | Qty: 90 | Fill #0

## 2020-11-29 NOTE — Assessment & Plan Note (Signed)
He has uncontrolled diabetes and is not modifying his diet He has strong history of diabetes We discussed the importance of risk factor modification, weight loss and getting his diabetes under control

## 2020-11-29 NOTE — Assessment & Plan Note (Signed)
The dose of oxycodone was recently increased to 15 mg to use as needed He can continue the same dose and to take additional ibuprofen as needed

## 2020-11-29 NOTE — Progress Notes (Signed)
[facility] OFFICE PROGRESS NOTE  Patient Care Team: Patient, No Pcp Per as PCP - General (General Practice) [name], MD as Consulting Physician (Hematology and Oncology) Eppie Gibson, MD as Attending Physician (Radiation Oncology) Jodi Marble, MD as Consulting Physician (Otolaryngology) Philomena Doheny, MD as Referring Physician (Plastic Surgery) Irene Shipper, MD as Consulting Physician (Gastroenterology) System, Provider Not In  ASSESSMENT & PLAN:  Malignant neoplasm of head and neck [facility]) His last CT imaging showed excellent response to therapy The mass on his neck is healing Per guidelines, We will continue pembrolizumab indefinitely  Acquired hypothyroidism He stated he is compliant taking Synthroid I will monitor TSH closely and adjust the dose of medicine as needed  Type 2 diabetes mellitus ([place]) He has uncontrolled diabetes and is not modifying his diet He has strong history of diabetes We discussed the importance of risk factor modification, weight loss and getting his diabetes under control  Cancer associated pain The dose of oxycodone was recently increased to 15 mg to use as needed He can continue the same dose and to take additional ibuprofen as needed   No orders of the defined types were placed in this encounter.   All questions were answered. The patient knows to call the clinic with any problems, questions or concerns. The total time spent in the appointment was 20 minutes encounter with patients including review of chart and various tests results, discussions about plan of care and coordination of care plan   [name], MD 11/29/2020 10:33 AM  INTERVAL HISTORY: Please see below for problem oriented charting. 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 Pan-endoscopy elsewhere showed cancer from nasopharyngeal space.   01/04/2007 - 02/20/2007 Chemotherapy   He received 2 cycles of cisplatin and 5FU followed by concurrent chemo with weekly cisplatin and radiation. He only received 2 doses of chemo due to severe mucositis, nausea and weight loss.   04/05/2007 - 08/04/2007 Chemotherapy   He received 4 more courses of cisplatin with 5FU and had complete response   07/05/2009 Procedure   Fine-needle aspirate of the right level II lymph nodes come from recurrent metastatic disease. Repeat endoscopy and CT scan show no evidence of disease elsewhere.   07/08/2009 - 12/02/2009 Chemotherapy   He was given 6 cycles of carboplatin, 5-FU and docetaxel   12/03/2009 Surgery   He has surgery to the residual lymph node on the right neck which showed no evidence of disease.   02/22/2012 Imaging   Repeat imaging study showed large recurrent mass. He was referred elsewhere for further treatment.   05/03/2012 Surgery   He underwent left upper lobectomy.   04/29/2013 Imaging   PEt scan showed lesion on right level II B and lower lung was abnormal   06/03/2013 - 02/02/2014 Chemotherapy   He  had 6 cycles of chemotherapy when he was found to have recurrence of cancer and had received oxaliplatin and capecitabine   06/07/2014 Imaging   PET CT scan showed persistent disease in the right neck lymph nodes and left lung   06/29/2014 Procedure   Accession: [id number] repeat LUL biopsy confirmed metastatic  cancer   07/18/2014 - 07/31/2014 Radiation Therapy   He received palliative radiation therapy to the lungs   10/10/2014 Imaging   CT scan of the chest, abdomen and pelvis show regression in the size of the lung nodule in the left upper lobe and stable pulmonary nodules   01/24/2015 Imaging   CT scan showed stable disease in neck and lung   06/19/2015 Imaging   CT scan of the neck and the chest show possible mild progression of the nodule in the right side of the neck.   06/25/2015 Imaging   PET scan confirmed disease recurrence in the neck   07/07/2015 Imaging   He had [facility] neck at [facility]   09/03/2015 - 08/26/2018 Chemotherapy   He received palliative chemo with Nivolumab   10/29/2015 Imaging   PET CT showed positive response to Rx   02/28/2016 Imaging   Ct abdomen showed abnormal thinkening in his stomach   03/03/2016 Imaging   CT: Right sternocleidomastoid muscle metastasis appears less distinct but otherwise not significantly changed in size or configuration since 06/19/2015.2. Left level 3 lymph node which was hypermetabolic by PET-CT in January 2017 appears slightly smaller   04/01/2016 Imaging   CT cervical spine showed no acute fracture or traumatic malalignment in the cervical spine   04/22/2016 Procedure   Port-a-cath placed.   06/16/2016 Imaging   Ct neck showed right sternocleidomastoid muscle metastasis is further decreased in conspicuity since May, and has mildly decreased in size since September 2016. Continued stability of sub-centimeter left cervical lymph nodes. No new or progressive metastatic disease in the neck.   06/16/2016 Imaging   CT chest showed stable masslike radiation fibrosis in the left upper lobe. Stable subcentimeter pulmonary nodules in the bilateral lower lobes. No new or progressive metastatic disease in the chest. Nonobstructing left renal stone.   10/13/2016 Imaging   Ct neck showed unchanged right sternocleidomastoid muscle metastasis. Unchanged  subcentimeter left cervical lymph nodes. No evidence of new or progressive metastatic disease in the neck.   10/13/2016 Imaging   CT chest showed tiny hypervascular foci in the liver, not definitely seen on prior imaging of 06/16/2016 and 02/28/2016. Abdomen MRI without and with contrast recommended to further evaluate as metastatic disease is a concern. 2. Stable appearance of post treatment changes left upper lung and scattered tiny bilateral pulmonary nodules.   02/11/2017 Imaging   Ct neck: Lymph node mass right posterior neck appears improved from the prior study. Small posterior lymph nodes on the left unchanged. Occluded right jugular vein unchanged.   02/11/2017 Imaging   1. Similar appearance of postsurgical and radiation changes in the left upper lobe. 2. Similar bilateral pulmonary nodules. 3. No thoracic adenopathy. 4. Subtle foci of post-contrast enhancement within the liver are suboptimally characterized on this nondedicated study. Likely similar. These could either be re-evaluated at followup or more entirely characterized with abdominal MRI. 5. Left nephrolithiasis.   05/19/2017 Imaging   Matted lymph node mass right posterior neck appears larger in the recent CT. Accurate measurements difficult due to infiltrating tumor margins and infiltration of the muscle. Right jugular vein again appears occluded or resected. Small left posterior lymph nodes  stable. Left upper lobe airspace density stable and similar to the prior CT   06/03/2017 PET scan   1. Hypermetabolic ill-defined right level IIb lymph node, about 1.3 cm in diameter with maximum SUV 9.5 (formerly 8.1). Appearance suspicious for residual/recurrent malignancy. No worrisome left-sided lesion. 2. Left suprahilar indistinct opacity demonstrates no worrisome hypermetabolic activity. The 5 mm left lower lobe pulmonary nodule is stable and not currently hypermetabolic although below sensitive PET-CT size thresholds. 3. Other  imaging findings of potential clinical significance: Bilateral nonobstructive nephrolithiasis. Chronic bilateral maxillary sinusitis.   05/11/2018 PET scan   1. Continued chronic accentuated metabolic activity in the vicinity of right level IIB and the adjacent right sternocleidomastoid muscle, with ill definition of surrounding tissue planes. Maximum SUV is currently 8.1, formerly 9.5. Accentuated metabolic activity is been present in this vicinity back through 06/25/2015, and there was also some low-level activity in this vicinity on 06/07/2014. Some of this may be from scarring and local muscular activity although clearly a component of residual tumor is difficult to exclude given the focally high activity. 2. Other imaging findings of potential clinical significance: Chronic bilateral maxillary sinusitis. Chronic scarring in the left upper lobe. Chronically stable 5 mm left lower lobe nodule is considered benign. Nonobstructive left nephrolithiasis.   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 COMMENT:No significant keratinization is identified. Other basaloid carcinomas are in the differential diagnosis. No cell block material is available for further testing.   09/12/2018 Procedure   He underwent fine Needle Aspiration   10/04/2018 PET scan   1. Significant progression of local recurrence laterally in the mid right neck with an enlarging, increasingly hypermetabolic soft tissue mass. This involves the right sternocleidomastoid muscle. 2. Small lymph nodes in the right axilla are increasingly hypermetabolic. These are nonspecific and potentially reactive, although could reflect a small metastases. Small hypermetabolic nodule in the left suprasternal notch is unchanged. 3. No other evidence of metastatic disease.    10/07/2018 - 12/23/2018  Chemotherapy   The patient had cisplatin plus gemzar   12/07/2018 Imaging   1. Decreased size of lateral right neck mass. 2. Unchanged soft tissue nodule in the suprasternal notch. 3. No evidence of new metastatic disease in the neck.    05/30/2019 Imaging   CT neck No clear change or progression compared to the study of March. Overall measurements of the right lateral neck mass are similar, approximately 3 x 1.8 cm. See above discussion. One could argue that there is slight increase in lateral bulging, possibly with an increase in contrast enhancement, towards the inferior margin. This is of questionable validity but could possibly represent some progression or inflammatory change. Other findings in the region are stable.   07/20/2019 - 09/15/2019 Chemotherapy   The patient had dexamethasone (DECADRON) 4 MG tablet, 1 of 1 cycle, Start date: --, End date: -- palonosetron (ALOXI) injection 0.25 mg, 0.25 mg, Intravenous,  Once, 3 of 4 cycles Administration: 0.25 mg (07/20/2019), 0.25 mg (08/10/2019), 0.25 mg (09/08/2019) CISplatin (PLATINOL) 84 mg in sodium chloride 0.9 % 250 mL chemo infusion, 40 mg/m2 = 84 mg (80 % of original dose 50 mg/m2), Intravenous,  Once, 3 of 4 cycles Dose modification: 40 mg/m2 (80 % of original dose 50 mg/m2, Cycle 1, Reason: Dose Not Tolerated) Administration: 84 mg (07/20/2019), 84 mg (08/10/2019), 83 mg (09/08/2019) gemcitabine (GEMZAR) 1,600 mg in sodium chloride 0.9 %  250 mL chemo infusion, 1,672 mg (80 % of original dose 1,000 mg/m2), Intravenous,  Once, 3 of 4 cycles Dose modification: 800 mg/m2 (80 % of original dose 1,000 mg/m2, Cycle 1, Reason: Provider Judgment) Administration: 1,600 mg (07/20/2019), 1,600 mg (07/27/2019), 1,600 mg (08/10/2019), 1,600 mg (08/17/2019), 1,600 mg (09/08/2019), 1,672 mg (09/15/2019) ondansetron (ZOFRAN) 8 mg, dexamethasone (DECADRON) 10 mg in sodium chloride 0.9 % 50 mL IVPB, , Intravenous,  Once, 3 of 4 cycles Administration:   (09/15/2019) fosaprepitant (EMEND) 150 mg, dexamethasone (DECADRON) 12 mg in sodium chloride 0.9 % 145 mL IVPB, , Intravenous,  Once, 3 of 4 cycles Administration:  (07/20/2019),  (08/10/2019),  (09/08/2019)  for chemotherapy treatment.    10/02/2019 Imaging   CT neck As compared to 05/30/2019, no significant interval change in size of an ill-defined mass within the right lateral neck, again measuring 3.3 x 1.8 cm in transaxial dimensions.   Unchanged mildly enlarged left level I lymph node measuring 1.1 cm in short axis.   Unchanged node or nodule at the thoracic inlet, measuring 1.3 x 0.8 cm.   Please refer to concurrently performed chest CT for a description of findings below the level of the thoracic inlet.     10/02/2019 Imaging   CT chest 1. No new or progressive findings in the chest to suggest metastatic disease. 2. Bilateral subcentimeter solid pulmonary nodules are stable since 2018. 3. Hyperdense 1.1 cm anterior liver focus, not clearly visualized on prior studies. Suggest MRI abdomen without and with IV contrast for further characterization.   10/20/2019 - 12/29/2019 Chemotherapy   The patient had ondansetron (ZOFRAN) injection 8 mg, 8 mg (100 % of original dose 8 mg), Intravenous,  Once, 2 of 5 cycles Dose modification: 8 mg (original dose 8 mg, Cycle 2) Administration: 8 mg (11/17/2019), 8 mg (12/15/2019), 8 mg (12/29/2019) gemcitabine (GEMZAR) 2,000 mg in sodium chloride 0.9 % 250 mL chemo infusion, 2,090 mg, Intravenous,  Once, 3 of 6 cycles Administration: 2,000 mg (10/20/2019), 2,000 mg (11/03/2019), 2,000 mg (11/17/2019), 2,000 mg (12/15/2019), 2,000 mg (12/29/2019)  for chemotherapy treatment.    06/12/2020 Imaging   1. Enlarging superficial, exophytic component of the chronic right sternocleidomastoid muscle mass. See series 6, image 55. 2. Elsewhere stable CT appearance of the Neck.   06/12/2020 Imaging   Post treatment scarring in the left hemithorax, stable. No evidence  recurrent or metastatic disease   06/14/2020 - 10/15/2020 Chemotherapy   He received carboplatin, 5FU and Beryle Flock       10/31/2020 Procedure   Interval improvement in right lateral lymph node mass. Improvement in dermal component as well as invasion of the right sternocleidomastoid muscle.   10 mm submental lymph node slightly enlarged compared to the prior study. Continued follow-up recommended.   11/01/2020 -  Chemotherapy    Patient is on Treatment Plan: HEAD/NECK PEMBROLIZUMAB Q21D        REVIEW OF SYSTEMS:   Constitutional: Denies fevers, chills or abnormal weight loss Eyes: Denies blurriness of vision Ears, nose, mouth, throat, and face: Denies mucositis or sore throat Respiratory: Denies cough, dyspnea or wheezes Cardiovascular: Denies palpitation, chest discomfort or lower extremity swelling Gastrointestinal:  Denies nausea, heartburn or change in bowel habits Skin: Denies abnormal skin rashes Lymphatics: Denies new lymphadenopathy or easy bruising Neurological:Denies numbness, tingling or new weaknesses Behavioral/Psych: Mood is stable, no new changes  All other systems were reviewed with the patient and are negative.  I have reviewed the past medical history, past surgical history, social  history and family history with the patient and they are unchanged from previous note.  ALLERGIES:  is allergic to phenergan [promethazine hcl], heparin, and clindamycin.  MEDICATIONS:  Current Outpatient Medications  Medication Sig Dispense Refill  . Accu-Chek Softclix Lancets lancets Use as instructed 100 each 12  . atorvastatin (LIPITOR) 40 MG tablet TK 1 T PO  D    . blood glucose meter kit and supplies KIT Dispense based on patient and insurance preference. Use up to four times daily as directed. (FOR ICD-9 250.00, 250.01). 1 each 1  . cetirizine (ZYRTEC) 10 MG tablet TAKE 1 TABLET(10 MG) BY MOUTH DAILY 30 tablet 1  . diclofenac Sodium (VOLTAREN) 1 % GEL Apply topically.    .  gabapentin (NEURONTIN) 300 MG capsule Take 1 capsule (300 mg total) by mouth 2 (two) times daily. 60 capsule 11  . glucose blood test strip Use as instructed 100 each 12  . levothyroxine (SYNTHROID) 137 MCG tablet Take 1 tablet (137 mcg total) by mouth daily before breakfast. 90 tablet 9  . LORazepam (ATIVAN) 1 MG tablet Take 1 tablet (1 mg total) by mouth every 8 (eight) hours as needed for anxiety. 30 tablet 0  . metFORMIN (GLUCOPHAGE) 500 MG tablet Take 1 tablet (500 mg total) by mouth 2 (two) times daily with a meal. 60 tablet 3  . omeprazole (PRILOSEC) 40 MG capsule Take 1 capsule (40 mg total) by mouth daily. 30 capsule 9  . ondansetron (ZOFRAN) 8 MG tablet Take 1 tablet (8 mg total) by mouth every 8 (eight) hours as needed for nausea or vomiting. 90 tablet 1  . oxyCODONE (ROXICODONE) 15 MG immediate release tablet Take 1 tablet (15 mg total) by mouth every 4 (four) hours as needed. 90 tablet 0  . PARoxetine (PAXIL) 20 MG tablet Take 1 tablet (20 mg total) by mouth daily. 30 tablet 5  . polyethylene glycol (MIRALAX) packet Take 17 g by mouth daily. (Patient taking differently: Take 17 g by mouth daily as needed for mild constipation or moderate constipation. ) 14 each 3  . senna-docusate (SENOKOT-S) 8.6-50 MG tablet Take 2 tablets by mouth 3 (three) times daily. 90 tablet 0  . Vitamin D, Ergocalciferol, (DRISDOL) 1.25 MG (50000 UT) CAPS capsule TK 1 C PO WEEKLY     No current facility-administered medications for this visit.   Facility-Administered Medications Ordered in Other Visits  Medication Dose Route Frequency Provider Last Rate Last Admin  . anticoagulant sodium citrate solution 5 mL  5 mL Intracatheter Once Alvy Bimler, Rosiland Sen, MD      . anticoagulant sodium citrate solution 5 mL  5 mL Intracatheter Once Alvy Bimler, Abriana Saltos, MD      . anticoagulant sodium citrate solution 5 mL  5 mL Intracatheter Once Alvy Bimler, [place], MD      . pembrolizumab (KEYTRUDA) 200 mg in sodium chloride 0.9 % 50 mL chemo  infusion  200 mg Intravenous Once Samiah Ricklefs, MD      . sodium chloride flush (NS) 0.9 % injection 10 mL  10 mL Intracatheter PRN Alvy Bimler, Lashunda Greis, MD        PHYSICAL EXAMINATION: ECOG PERFORMANCE STATUS: 1 - Symptomatic but completely ambulatory  Vitals:   11/29/20 0930  BP: 126/83  Pulse: 89  Resp: 18  Temp: 98.2 F (36.8 C)  SpO2: 100%   Filed Weights   11/29/20 0930  Weight: 196 lb 12.8 oz (89.3 kg)    GENERAL:alert, no distress and comfortable NECK: The neck mass is reduced  in size NEURO: alert & oriented x 3 with fluent speech, no focal motor/sensory deficits  LABORATORY DATA:  I have reviewed the data as listed    Component Value Date/Time   NA 139 11/29/2020 0910   NA 139 09/22/2017 0829   K 3.7 11/29/2020 0910   K 3.5 09/22/2017 0829   CL 103 11/29/2020 0910   CO2 23 11/29/2020 0910   CO2 26 09/22/2017 0829   GLUCOSE 194 (H) 11/29/2020 0910   GLUCOSE 133 09/22/2017 0829   BUN 12 11/29/2020 0910   BUN 14.1 09/22/2017 0829   CREATININE 0.97 11/29/2020 0910   CREATININE 0.9 09/22/2017 0829   CALCIUM 9.2 11/29/2020 0910   CALCIUM 9.1 09/22/2017 0829   PROT 7.0 11/29/2020 0910   PROT 6.8 09/22/2017 0829   ALBUMIN 4.1 11/29/2020 0910   ALBUMIN 4.1 09/22/2017 0829   AST 21 11/29/2020 0910   AST 22 09/22/2017 0829   ALT 26 11/29/2020 0910   ALT 30 09/22/2017 0829   ALKPHOS 64 11/29/2020 0910   ALKPHOS 55 09/22/2017 0829   BILITOT 0.3 11/29/2020 0910   BILITOT 0.35 09/22/2017 0829   GFRNONAA >60 11/29/2020 0910   GFRAA >60 07/05/2020 0845   GFRAA >60 02/06/2019 1215    No results found for: SPEP, UPEP  Lab Results  Component Value Date   WBC 4.5 11/29/2020   NEUTROABS 2.5 11/29/2020   HGB 13.8 11/29/2020   HCT 41.0 11/29/2020   MCV 93.2 11/29/2020   PLT 202 11/29/2020      Chemistry      Component Value Date/Time   NA 139 11/29/2020 0910   NA 139 09/22/2017 0829   K 3.7 11/29/2020 0910   K 3.5 09/22/2017 0829   CL 103 11/29/2020 0910   CO2  23 11/29/2020 0910   CO2 26 09/22/2017 0829   BUN 12 11/29/2020 0910   BUN 14.1 09/22/2017 0829   CREATININE 0.97 11/29/2020 0910   CREATININE 0.9 09/22/2017 0829      Component Value Date/Time   CALCIUM 9.2 11/29/2020 0910   CALCIUM 9.1 09/22/2017 0829   ALKPHOS 64 11/29/2020 0910   ALKPHOS 55 09/22/2017 0829   AST 21 11/29/2020 0910   AST 22 09/22/2017 0829   ALT 26 11/29/2020 0910   ALT 30 09/22/2017 0829   BILITOT 0.3 11/29/2020 0910   BILITOT 0.35 09/22/2017 0829       RADIOGRAPHIC STUDIES: I have personally reviewed the radiological images as listed and agreed with the findings in the report. CT Soft Tissue Neck W Contrast  Result Date: 10/31/2020 CLINICAL DATA:  Nasopharyngeal carcinoma. Assess response to treatment. EXAM: CT NECK WITH CONTRAST TECHNIQUE: Multidetector CT imaging of the neck was performed using the standard protocol following the bolus administration of intravenous contrast. CONTRAST:  68m OMNIPAQUE IOHEXOL 300 MG/ML  SOLN COMPARISON:  CT neck 06/12/2020 FINDINGS: Pharynx and larynx: No recurrent mass. Mild soft tissue thickening in the nasopharynx likely due to radiation change. Larynx and airway intact. Salivary glands: Normal parotid. Atrophic or surgically absent right submandibular gland unchanged. Small left submandibular gland without focal abnormality. Thyroid: Diminutive thyroid without focal abnormality Lymph nodes: Right lateral neck mass has improved in the interval. The mass infiltrates the right sternocleidomastoid muscle and extend to the skin surface. The dermal component of the mass also improved in the interval. Mass has ill-defined margins and difficult to measure however estimated size is 33 x 20 mm. Submental lymph node in the midline measures 10 mm slightly  increased from the prior study. Lymph node in the sternal notch region measures 7 mm unchanged. No enlarged lymph nodes in the left neck. Vascular: Right jugular vein appears chronically  occluded. Right jugular Port-A-Cath noted. Limited intracranial: Negative Visualized orbits: Negative Mastoids and visualized paranasal sinuses: Mucosal edema in the maxillary sinus bilaterally. Cyst left maxillary sinus. Mastoid clear bilaterally. Skeleton: No acute skeletal abnormality. Upper chest: Left upper lobe density compatible scarring unchanged. No new findings. Other: None IMPRESSION: Interval improvement in right lateral lymph node mass. Improvement in dermal component as well as invasion of the right sternocleidomastoid muscle. 10 mm submental lymph node slightly enlarged compared to the prior study. Continued follow-up recommended. Electronically Signed   By: Franchot Gallo M.D.   On: 10/31/2020 09:12

## 2020-11-29 NOTE — Patient Instructions (Signed)
[facility] Discharge Instructions for Patients Receiving Chemotherapy  Today you received the following chemotherapy agents: Pembrolizumab Beryle Flock)  To help prevent nausea and vomiting after your treatment, we encourage you to take your nausea medication  as prescribed.    If you develop nausea and vomiting that is not controlled by your nausea medication, call the clinic.   BELOW ARE SYMPTOMS THAT SHOULD BE REPORTED IMMEDIATELY:  *FEVER GREATER THAN 100.5 F  *CHILLS WITH OR WITHOUT FEVER  NAUSEA AND VOMITING THAT IS NOT CONTROLLED WITH YOUR NAUSEA MEDICATION  *UNUSUAL SHORTNESS OF BREATH  *UNUSUAL BRUISING OR BLEEDING  TENDERNESS IN MOUTH AND THROAT WITH OR WITHOUT PRESENCE OF ULCERS  *URINARY PROBLEMS  *BOWEL PROBLEMS  UNUSUAL RASH Items with * indicate a potential emergency and should be followed up as soon as possible.  Feel free to call the clinic should you have any questions or concerns. The clinic phone number is [phone].  Please show the [facility] at check-in to the Emergency Department and triage nurse.

## 2020-11-29 NOTE — Progress Notes (Signed)
During his visit in the infusion clinic, patient reported he was feeling nauseous, PRN zofran was administered. Upon discharge, patient reported he felt better, ambulatory and in no distress upon discharge.

## 2020-11-29 NOTE — Patient Instructions (Signed)

## 2020-11-29 NOTE — Assessment & Plan Note (Signed)
He stated he is compliant taking Synthroid I will monitor TSH closely and adjust the dose of medicine as needed

## 2020-11-29 NOTE — Assessment & Plan Note (Signed)
His last CT imaging showed excellent response to therapy The mass on his neck is healing Per guidelines, We will continue pembrolizumab indefinitely

## 2020-11-30 LAB — T4: T4, Total: 6.5 ug/dL (ref 4.5–12.0)

## 2020-12-06 MED FILL — METFORMIN HCL 500 MG TABS: 500 | 30 days supply | Qty: 60 | Fill #1

## 2020-12-09 MED FILL — LEVOTHYROXINE SODIUM 137 MC: 137 | 90 days supply | Qty: 90 | Fill #0

## 2020-12-09 MED FILL — METFORMIN HCL 500 MG TABS: 500 | 30 days supply | Qty: 60 | Fill #0

## 2020-12-12 ENCOUNTER — Ambulatory Visit: Payer: Medicaid Other | Admitting: Hematology and Oncology

## 2020-12-12 ENCOUNTER — Other Ambulatory Visit: Payer: Medicaid Other

## 2020-12-12 ENCOUNTER — Ambulatory Visit: Payer: Medicaid Other

## 2020-12-18 ENCOUNTER — Telehealth: Payer: Self-pay

## 2020-12-18 NOTE — Telephone Encounter (Signed)
Pt called requesting refill on Oxycodone 15mg .   Last Rx 2/25.

## 2020-12-19 ENCOUNTER — Other Ambulatory Visit: Payer: Self-pay | Admitting: Hematology and Oncology

## 2020-12-19 ENCOUNTER — Telehealth: Payer: Self-pay

## 2020-12-19 MED ORDER — OXYCODONE HCL 15 MG PO TABS: 15.0000 mg | ORAL_TABLET | ORAL | 0 refills | Status: DC | PRN

## 2020-12-19 NOTE — Telephone Encounter (Signed)
He called and left a message requesting a refill on Oxycodone Rx.

## 2020-12-19 NOTE — Telephone Encounter (Signed)
done

## 2020-12-19 NOTE — Telephone Encounter (Signed)
Called and left a message Rx sent. Ask him to call the office back if needed.

## 2020-12-20 ENCOUNTER — Telehealth: Payer: Self-pay | Admitting: Hematology and Oncology

## 2020-12-20 ENCOUNTER — Other Ambulatory Visit: Payer: Self-pay

## 2020-12-20 ENCOUNTER — Encounter: Payer: Self-pay | Admitting: Hematology and Oncology

## 2020-12-20 ENCOUNTER — Inpatient Hospital Stay: Payer: Medicaid Other

## 2020-12-20 ENCOUNTER — Inpatient Hospital Stay: Payer: Medicaid Other | Attending: Hematology and Oncology

## 2020-12-20 ENCOUNTER — Inpatient Hospital Stay (HOSPITAL_BASED_OUTPATIENT_CLINIC_OR_DEPARTMENT_OTHER): Payer: Medicaid Other | Admitting: Hematology and Oncology

## 2020-12-20 DIAGNOSIS — Z79899 Other long term (current) drug therapy: Secondary | ICD-10-CM | POA: Insufficient documentation

## 2020-12-20 DIAGNOSIS — C760 Malignant neoplasm of head, face and neck: Secondary | ICD-10-CM | POA: Diagnosis not present

## 2020-12-20 DIAGNOSIS — Z5112 Encounter for antineoplastic immunotherapy: Secondary | ICD-10-CM | POA: Insufficient documentation

## 2020-12-20 DIAGNOSIS — Z95828 Presence of other vascular implants and grafts: Secondary | ICD-10-CM

## 2020-12-20 DIAGNOSIS — C119 Malignant neoplasm of nasopharynx, unspecified: Secondary | ICD-10-CM | POA: Insufficient documentation

## 2020-12-20 DIAGNOSIS — Z7189 Other specified counseling: Secondary | ICD-10-CM

## 2020-12-20 DIAGNOSIS — E119 Type 2 diabetes mellitus without complications: Secondary | ICD-10-CM | POA: Diagnosis not present

## 2020-12-20 DIAGNOSIS — E039 Hypothyroidism, unspecified: Secondary | ICD-10-CM | POA: Diagnosis not present

## 2020-12-20 DIAGNOSIS — C7800 Secondary malignant neoplasm of unspecified lung: Secondary | ICD-10-CM

## 2020-12-20 LAB — CMP (CANCER CENTER ONLY)
ALT: 28 U/L (ref 0–44)
AST: 23 U/L (ref 15–41)
Albumin: 4.1 g/dL (ref 3.5–5.0)
Alkaline Phosphatase: 64 U/L (ref 38–126)
Anion gap: 9 (ref 5–15)
BUN: 11 mg/dL (ref 6–20)
CO2: 25 mmol/L (ref 22–32)
Calcium: 9.5 mg/dL (ref 8.9–10.3)
Chloride: 104 mmol/L (ref 98–111)
Creatinine: 0.85 mg/dL (ref 0.61–1.24)
GFR, Estimated: >60 mL/min (ref >60)
Glucose, Bld: 113 mg/dL — ABNORMAL HIGH (ref 70–99)
Potassium: 3.8 mmol/L (ref 3.5–5.1)
Sodium: 138 mmol/L (ref 135–145)
Total Bilirubin: 0.2 mg/dL — ABNORMAL LOW (ref 0.3–1.2)
Total Protein: 7 g/dL (ref 6.5–8.1)

## 2020-12-20 LAB — CBC WITH DIFFERENTIAL (CANCER CENTER ONLY)
Abs Immature Granulocytes: 0.01 10*3/uL (ref 0.00–0.07)
Basophils Absolute: 0 10*3/uL (ref 0.0–0.1)
Basophils Relative: 1 %
Eosinophils Absolute: 0.5 10*3/uL (ref 0.0–0.5)
Eosinophils Relative: 9 %
HCT: 40.1 % (ref 39.0–52.0)
Hemoglobin: 13.6 g/dL (ref 13.0–17.0)
Immature Granulocytes: 0 %
Lymphocytes Relative: 29 %
Lymphs Abs: 1.7 10*3/uL (ref 0.7–4.0)
MCH: 30.2 pg (ref 26.0–34.0)
MCHC: 33.9 g/dL (ref 30.0–36.0)
MCV: 89.1 fL (ref 80.0–100.0)
Monocytes Absolute: 0.5 10*3/uL (ref 0.1–1.0)
Monocytes Relative: 9 %
Neutro Abs: 3.1 10*3/uL (ref 1.7–7.7)
Neutrophils Relative %: 52 %
Platelet Count: 212 10*3/uL (ref 150–400)
RBC: 4.5 MIL/uL (ref 4.22–5.81)
RDW: 13.1 % (ref 11.5–15.5)
WBC Count: 5.9 10*3/uL (ref 4.0–10.5)
nRBC: 0 % (ref 0.0–0.2)

## 2020-12-20 LAB — TSH: TSH: 1.324 u[IU]/mL (ref 0.320–4.118)

## 2020-12-20 MED ORDER — SODIUM CHLORIDE 0.9 % IV SOLN
200.0000 mg | Freq: Once | INTRAVENOUS | Status: AC
  Administered 2020-12-20: 200 mg via INTRAVENOUS
  Filled 2020-12-20: qty 8

## 2020-12-20 MED ORDER — ANTICOAGULANT SODIUM CITRATE 4% (200MG/5ML) IV SOLN
5.0000 mL | Freq: Once | Status: AC
  Administered 2020-12-20: 5 mL
  Filled 2020-12-20: qty 5

## 2020-12-20 MED ORDER — SODIUM CHLORIDE 0.9% FLUSH
10.0000 mL | Freq: Once | INTRAVENOUS | Status: AC
  Administered 2020-12-20: 10 mL
  Filled 2020-12-20: qty 10

## 2020-12-20 MED ORDER — SODIUM CHLORIDE 0.9 % IV SOLN
Freq: Once | INTRAVENOUS | Status: AC
  Filled 2020-12-20: qty 250

## 2020-12-20 MED ORDER — ONDANSETRON HCL 4 MG/2ML IJ SOLN
8.0000 mg | Freq: Once | INTRAMUSCULAR | Status: AC
  Administered 2020-12-20: 8 mg via INTRAVENOUS

## 2020-12-20 MED ORDER — ONDANSETRON HCL 4 MG/2ML IJ SOLN
INTRAMUSCULAR | Status: AC
  Filled 2020-12-20: qty 4

## 2020-12-20 MED ORDER — SODIUM CHLORIDE 0.9% FLUSH
10.0000 mL | INTRAVENOUS | Status: DC | PRN
  Administered 2020-12-20: 10 mL
  Filled 2020-12-20: qty 10

## 2020-12-20 NOTE — Assessment & Plan Note (Signed)
His home blood sugar monitoring is borderline high He tolerated Metformin well He has lost some weight I recommend he continue his aggressive lifestyle changes and weight loss

## 2020-12-20 NOTE — Progress Notes (Signed)
[facility] OFFICE PROGRESS NOTE  Patient Care Team: Patient, No Pcp Per as PCP - General (General Practice) [name], MD as Consulting Physician (Hematology and Oncology) Eppie Gibson, MD as Attending Physician (Radiation Oncology) Jodi Marble, MD as Consulting Physician (Otolaryngology) Philomena Doheny, MD as Referring Physician (Plastic Surgery) Irene Shipper, MD as Consulting Physician (Gastroenterology) System, Provider Not In  ASSESSMENT & PLAN:  Malignant neoplasm of head and neck [facility]) His last CT imaging showed excellent response to therapy The mass on his neck is healing Per guidelines, We will continue pembrolizumab indefinitely  Acquired hypothyroidism He stated he is compliant taking Synthroid I will monitor TSH closely and adjust the dose of medicine as needed  Type 2 diabetes mellitus ([place]) His home blood sugar monitoring is borderline high He tolerated Metformin well He has lost some weight I recommend he continue his aggressive lifestyle changes and weight loss   No orders of the defined types were placed in this encounter.   All questions were answered. The patient knows to call the clinic with any problems, questions or concerns. The total time spent in the appointment was 20 minutes encounter with patients including review of chart and various tests results, discussions about plan of care and coordination of care plan   [name], MD 12/20/2020 10:05 AM  INTERVAL HISTORY: Please see below for problem oriented charting. 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 Pan-endoscopy elsewhere showed cancer from nasopharyngeal space.   01/04/2007 - 02/20/2007 Chemotherapy   He received 2 cycles of cisplatin and 5FU followed by concurrent chemo with weekly cisplatin and radiation. He only received 2 doses of chemo due to severe mucositis, nausea and weight loss.   04/05/2007 - 08/04/2007 Chemotherapy   He received 4 more courses of cisplatin with 5FU and had complete response   07/05/2009 Procedure   Fine-needle aspirate of the right level II lymph nodes come from recurrent metastatic disease. Repeat endoscopy and CT scan show no evidence of disease elsewhere.   07/08/2009 - 12/02/2009 Chemotherapy   He was given 6 cycles of carboplatin, 5-FU and docetaxel   12/03/2009 Surgery   He has surgery to the residual lymph node on the right neck which showed no evidence of disease.   02/22/2012 Imaging   Repeat imaging study showed large recurrent mass. He was referred elsewhere for further treatment.   05/03/2012 Surgery   He underwent left upper lobectomy.   04/29/2013 Imaging   PEt scan showed lesion on right level II B and lower lung was abnormal   06/03/2013 - 02/02/2014 Chemotherapy   He had  6 cycles of chemotherapy when he was found to have recurrence of cancer and had received oxaliplatin and capecitabine   06/07/2014 Imaging   PET CT scan showed persistent disease in the right neck lymph nodes and left lung   06/29/2014 Procedure   Accession: [id number] repeat LUL biopsy confirmed metastatic cancer    07/18/2014 - 07/31/2014 Radiation Therapy   He received palliative radiation therapy to the lungs   10/10/2014 Imaging   CT scan of the chest, abdomen and pelvis show regression in the size of the lung nodule in the left upper lobe and stable pulmonary nodules   01/24/2015 Imaging   CT scan showed stable disease in neck and lung   06/19/2015 Imaging   CT scan of the neck and the chest show possible mild progression of the nodule in the right side of the neck.   06/25/2015 Imaging   PET scan confirmed disease recurrence in the neck   07/07/2015 Imaging   He had [facility] neck [facility]   09/03/2015 - 08/26/2018 Chemotherapy   He received palliative chemo with Nivolumab   10/29/2015 Imaging   PET CT showed positive response to Rx   02/28/2016 Imaging   Ct abdomen showed abnormal thinkening in his stomach   03/03/2016 Imaging   CT: Right sternocleidomastoid muscle metastasis appears less distinct but otherwise not significantly changed in size or configuration since 06/19/2015.2. Left level 3 lymph node which was hypermetabolic by PET-CT in January 2017 appears slightly smaller   04/01/2016 Imaging   CT cervical spine showed no acute fracture or traumatic malalignment in the cervical spine   04/22/2016 Procedure   Port-a-cath placed.   06/16/2016 Imaging   Ct neck showed right sternocleidomastoid muscle metastasis is further decreased in conspicuity since May, and has mildly decreased in size since September 2016. Continued stability of sub-centimeter left cervical lymph nodes. No new or progressive metastatic disease in the neck.   06/16/2016 Imaging   CT chest showed stable masslike radiation fibrosis in the left upper lobe. Stable subcentimeter pulmonary nodules in the bilateral lower lobes. No new or progressive metastatic disease in the chest. Nonobstructing left renal stone.   10/13/2016 Imaging   Ct neck showed unchanged right sternocleidomastoid muscle metastasis. Unchanged subcentimeter  left cervical lymph nodes. No evidence of new or progressive metastatic disease in the neck.   10/13/2016 Imaging   CT chest showed tiny hypervascular foci in the liver, not definitely seen on prior imaging of 06/16/2016 and 02/28/2016. Abdomen MRI without and with contrast recommended to further evaluate as metastatic disease is a concern. 2. Stable appearance of post treatment changes left upper lung and scattered tiny bilateral pulmonary nodules.   02/11/2017 Imaging   Ct neck: Lymph node mass right posterior neck appears improved from the prior study. Small posterior lymph nodes on the left unchanged. Occluded right jugular vein unchanged.   02/11/2017 Imaging   1. Similar appearance of postsurgical and radiation changes in the left upper lobe. 2. Similar bilateral pulmonary nodules. 3. No thoracic adenopathy. 4. Subtle foci of post-contrast enhancement within the liver are suboptimally characterized on this nondedicated study. Likely similar. These could either be re-evaluated at followup or more entirely characterized with abdominal MRI. 5. Left nephrolithiasis.   05/19/2017 Imaging   Matted lymph node mass right posterior neck appears larger in the recent CT. Accurate measurements difficult due to infiltrating tumor margins and infiltration of the muscle. Right jugular vein again appears occluded or resected. Small left posterior lymph nodes stable.  Left upper lobe airspace density stable and similar to the prior CT   06/03/2017 PET scan   1. Hypermetabolic ill-defined right level IIb lymph node, about 1.3 cm in diameter with maximum SUV 9.5 (formerly 8.1). Appearance suspicious for residual/recurrent malignancy. No worrisome left-sided lesion. 2. Left suprahilar indistinct opacity demonstrates no worrisome hypermetabolic activity. The 5 mm left lower lobe pulmonary nodule is stable and not currently hypermetabolic although below sensitive PET-CT size thresholds. 3. Other imaging findings of  potential clinical significance: Bilateral nonobstructive nephrolithiasis. Chronic bilateral maxillary sinusitis.   05/11/2018 PET scan   1. Continued chronic accentuated metabolic activity in the vicinity of right level IIB and the adjacent right sternocleidomastoid muscle, with ill definition of surrounding tissue planes. Maximum SUV is currently 8.1, formerly 9.5. Accentuated metabolic activity is been present in this vicinity back through 06/25/2015, and there was also some low-level activity in this vicinity on 06/07/2014. Some of this may be from scarring and local muscular activity although clearly a component of residual tumor is difficult to exclude given the focally high activity. 2. Other imaging findings of potential clinical significance: Chronic bilateral maxillary sinusitis. Chronic scarring in the left upper lobe. Chronically stable 5 mm left lower lobe nodule is considered benign. Nonobstructive left nephrolithiasis.   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 COMMENT:No significant keratinization is identified. Other basaloid carcinomas are in the differential diagnosis. No cell block material is available for further testing.   09/12/2018 Procedure   He underwent fine Needle Aspiration   10/04/2018 PET scan   1. Significant progression of local recurrence laterally in the mid right neck with an enlarging, increasingly hypermetabolic soft tissue mass. This involves the right sternocleidomastoid muscle. 2. Small lymph nodes in the right axilla are increasingly hypermetabolic. These are nonspecific and potentially reactive, although could reflect a small metastases. Small hypermetabolic nodule in the left suprasternal notch is unchanged. 3. No other evidence of metastatic disease.    10/07/2018 - 12/23/2018 Chemotherapy   The patient had  cisplatin plus gemzar   12/07/2018 Imaging   1. Decreased size of lateral right neck mass. 2. Unchanged soft tissue nodule in the suprasternal notch. 3. No evidence of new metastatic disease in the neck.    05/30/2019 Imaging   CT neck No clear change or progression compared to the study of March. Overall measurements of the right lateral neck mass are similar, approximately 3 x 1.8 cm. See above discussion. One could argue that there is slight increase in lateral bulging, possibly with an increase in contrast enhancement, towards the inferior margin. This is of questionable validity but could possibly represent some progression or inflammatory change. Other findings in the region are stable.   07/20/2019 - 09/15/2019 Chemotherapy   The patient had dexamethasone (DECADRON) 4 MG tablet, 1 of 1 cycle, Start date: --, End date: -- palonosetron (ALOXI) injection 0.25 mg, 0.25 mg, Intravenous,  Once, 3 of 4 cycles Administration: 0.25 mg (07/20/2019), 0.25 mg (08/10/2019), 0.25 mg (09/08/2019) CISplatin (PLATINOL) 84 mg in sodium chloride 0.9 % 250 mL chemo infusion, 40 mg/m2 = 84 mg (80 % of original dose 50 mg/m2), Intravenous,  Once, 3 of 4 cycles Dose modification: 40 mg/m2 (80 % of original dose 50 mg/m2, Cycle 1, Reason: Dose Not Tolerated) Administration: 84 mg (07/20/2019), 84 mg (08/10/2019), 83 mg (09/08/2019) gemcitabine (GEMZAR) 1,600 mg in sodium chloride 0.9 % 250  mL chemo infusion, 1,672 mg (80 % of original dose 1,000 mg/m2), Intravenous,  Once, 3 of 4 cycles Dose modification: 800 mg/m2 (80 % of original dose 1,000 mg/m2, Cycle 1, Reason: Provider Judgment) Administration: 1,600 mg (07/20/2019), 1,600 mg (07/27/2019), 1,600 mg (08/10/2019), 1,600 mg (08/17/2019), 1,600 mg (09/08/2019), 1,672 mg (09/15/2019) ondansetron (ZOFRAN) 8 mg, dexamethasone (DECADRON) 10 mg in sodium chloride 0.9 % 50 mL IVPB, , Intravenous,  Once, 3 of 4 cycles Administration:  (09/15/2019) fosaprepitant (EMEND)  150 mg, dexamethasone (DECADRON) 12 mg in sodium chloride 0.9 % 145 mL IVPB, , Intravenous,  Once, 3 of 4 cycles Administration:  (07/20/2019),  (08/10/2019),  (09/08/2019)  for chemotherapy treatment.    10/02/2019 Imaging   CT neck As compared to 05/30/2019, no significant interval change in size of an ill-defined mass within the right lateral neck, again measuring 3.3 x 1.8 cm in transaxial dimensions.   Unchanged mildly enlarged left level I lymph node measuring 1.1 cm in short axis.   Unchanged node or nodule at the thoracic inlet, measuring 1.3 x 0.8 cm.   Please refer to concurrently performed chest CT for a description of findings below the level of the thoracic inlet.     10/02/2019 Imaging   CT chest 1. No new or progressive findings in the chest to suggest metastatic disease. 2. Bilateral subcentimeter solid pulmonary nodules are stable since 2018. 3. Hyperdense 1.1 cm anterior liver focus, not clearly visualized on prior studies. Suggest MRI abdomen without and with IV contrast for further characterization.   10/20/2019 - 12/29/2019 Chemotherapy   The patient had ondansetron (ZOFRAN) injection 8 mg, 8 mg (100 % of original dose 8 mg), Intravenous,  Once, 2 of 5 cycles Dose modification: 8 mg (original dose 8 mg, Cycle 2) Administration: 8 mg (11/17/2019), 8 mg (12/15/2019), 8 mg (12/29/2019) gemcitabine (GEMZAR) 2,000 mg in sodium chloride 0.9 % 250 mL chemo infusion, 2,090 mg, Intravenous,  Once, 3 of 6 cycles Administration: 2,000 mg (10/20/2019), 2,000 mg (11/03/2019), 2,000 mg (11/17/2019), 2,000 mg (12/15/2019), 2,000 mg (12/29/2019)  for chemotherapy treatment.    06/12/2020 Imaging   1. Enlarging superficial, exophytic component of the chronic right sternocleidomastoid muscle mass. See series 6, image 55. 2. Elsewhere stable CT appearance of the Neck.   06/12/2020 Imaging   Post treatment scarring in the left hemithorax, stable. No evidence recurrent or metastatic disease    06/14/2020 - 10/15/2020 Chemotherapy   He received carboplatin, 5FU and Beryle Flock       10/31/2020 Procedure   Interval improvement in right lateral lymph node mass. Improvement in dermal component as well as invasion of the right sternocleidomastoid muscle.   10 mm submental lymph node slightly enlarged compared to the prior study. Continued follow-up recommended.   11/01/2020 -  Chemotherapy    Patient is on Treatment Plan: HEAD/NECK PEMBROLIZUMAB Q21D        REVIEW OF SYSTEMS:   Constitutional: Denies fevers, chills Eyes: Denies blurriness of vision Ears, nose, mouth, throat, and face: Denies mucositis or sore throat Respiratory: Denies cough, dyspnea or wheezes Cardiovascular: Denies palpitation, chest discomfort or lower extremity swelling Gastrointestinal:  Denies nausea, heartburn or change in bowel habits Skin: Denies abnormal skin rashes Lymphatics: Denies new lymphadenopathy or easy bruising Neurological:Denies numbness, tingling or new weaknesses Behavioral/Psych: Mood is stable, no new changes  All other systems were reviewed with the patient and are negative.  I have reviewed the past medical history, past surgical history, social history and family history with  the patient and they are unchanged from previous note.  ALLERGIES:  is allergic to phenergan [promethazine hcl], heparin, and clindamycin.  MEDICATIONS:  Current Outpatient Medications  Medication Sig Dispense Refill  . Accu-Chek Softclix Lancets lancets Use as instructed 100 each 12  . atorvastatin (LIPITOR) 40 MG tablet TK 1 T PO  D    . blood glucose meter kit and supplies KIT Dispense based on patient and insurance preference. Use up to four times daily as directed. (FOR ICD-9 250.00, 250.01). 1 each 1  . cetirizine (ZYRTEC) 10 MG tablet TAKE 1 TABLET(10 MG) BY MOUTH DAILY 30 tablet 1  . diclofenac Sodium (VOLTAREN) 1 % GEL Apply topically.    . gabapentin (NEURONTIN) 300 MG capsule Take 1 capsule (300  mg total) by mouth 2 (two) times daily. 60 capsule 11  . glucose blood test strip Use as instructed 100 each 12  . levothyroxine (SYNTHROID) 137 MCG tablet Take 1 tablet (137 mcg total) by mouth daily before breakfast. 90 tablet 9  . LORazepam (ATIVAN) 1 MG tablet Take 1 tablet (1 mg total) by mouth every 8 (eight) hours as needed for anxiety. 30 tablet 0  . metFORMIN (GLUCOPHAGE) 500 MG tablet Take 1 tablet (500 mg total) by mouth 2 (two) times daily with a meal. 60 tablet 3  . omeprazole (PRILOSEC) 40 MG capsule Take 1 capsule (40 mg total) by mouth daily. 30 capsule 9  . ondansetron (ZOFRAN) 8 MG tablet Take 1 tablet (8 mg total) by mouth every 8 (eight) hours as needed for nausea or vomiting. 90 tablet 1  . oxyCODONE (ROXICODONE) 15 MG immediate release tablet Take 1 tablet (15 mg total) by mouth every 4 (four) hours as needed. 90 tablet 0  . PARoxetine (PAXIL) 20 MG tablet Take 1 tablet (20 mg total) by mouth daily. 30 tablet 5  . polyethylene glycol (MIRALAX) packet Take 17 g by mouth daily. (Patient taking differently: Take 17 g by mouth daily as needed for mild constipation or moderate constipation. ) 14 each 3  . senna-docusate (SENOKOT-S) 8.6-50 MG tablet Take 2 tablets by mouth 3 (three) times daily. 90 tablet 0  . Vitamin D, Ergocalciferol, (DRISDOL) 1.25 MG (50000 UT) CAPS capsule TK 1 C PO WEEKLY     No current facility-administered medications for this visit.   Facility-Administered Medications Ordered in Other Visits  Medication Dose Route Frequency Provider Last Rate Last Admin  . anticoagulant sodium citrate solution 5 mL  5 mL Intracatheter Once Alvy Bimler, Avin Gibbons, MD      . anticoagulant sodium citrate solution 5 mL  5 mL Intracatheter Once Alvy Bimler, Adrieana Fennelly, MD        PHYSICAL EXAMINATION: ECOG PERFORMANCE STATUS: 1 - Symptomatic but completely ambulatory  Vitals:   12/20/20 0951  BP: 122/82  Pulse: 85  Resp: 18  Temp: (!) 97.5 F (36.4 C)  SpO2: 100%   Filed Weights    12/20/20 0951  Weight: 192 lb (87.1 kg)    GENERAL:alert, no distress and comfortable SKIN: skin color, texture, turgor are normal, no rashes or significant lesions EYES: normal, Conjunctiva are pink and non-injected, sclera clear OROPHARYNX:no exudate, no erythema and lips, buccal mucosa, and tongue normal  NECK: The neck mass is almost completely disappeared.  Neck fibrosis is noted NEURO: alert & oriented x 3 with fluent speech, no focal motor/sensory deficits  LABORATORY DATA:  I have reviewed the data as listed    Component Value Date/Time   NA 139 11/29/2020 0910  NA 139 09/22/2017 0829   K 3.7 11/29/2020 0910   K 3.5 09/22/2017 0829   CL 103 11/29/2020 0910   CO2 23 11/29/2020 0910   CO2 26 09/22/2017 0829   GLUCOSE 194 (H) 11/29/2020 0910   GLUCOSE 133 09/22/2017 0829   BUN 12 11/29/2020 0910   BUN 14.1 09/22/2017 0829   CREATININE 0.97 11/29/2020 0910   CREATININE 0.9 09/22/2017 0829   CALCIUM 9.2 11/29/2020 0910   CALCIUM 9.1 09/22/2017 0829   PROT 7.0 11/29/2020 0910   PROT 6.8 09/22/2017 0829   ALBUMIN 4.1 11/29/2020 0910   ALBUMIN 4.1 09/22/2017 0829   AST 21 11/29/2020 0910   AST 22 09/22/2017 0829   ALT 26 11/29/2020 0910   ALT 30 09/22/2017 0829   ALKPHOS 64 11/29/2020 0910   ALKPHOS 55 09/22/2017 0829   BILITOT 0.3 11/29/2020 0910   BILITOT 0.35 09/22/2017 0829   GFRNONAA >60 11/29/2020 0910   GFRAA >60 07/05/2020 0845   GFRAA >60 02/06/2019 1215    No results found for: SPEP, UPEP  Lab Results  Component Value Date   WBC 5.9 12/20/2020   NEUTROABS 3.1 12/20/2020   HGB 13.6 12/20/2020   HCT 40.1 12/20/2020   MCV 89.1 12/20/2020   PLT 212 12/20/2020      Chemistry      Component Value Date/Time   NA 139 11/29/2020 0910   NA 139 09/22/2017 0829   K 3.7 11/29/2020 0910   K 3.5 09/22/2017 0829   CL 103 11/29/2020 0910   CO2 23 11/29/2020 0910   CO2 26 09/22/2017 0829   BUN 12 11/29/2020 0910   BUN 14.1 09/22/2017 0829   CREATININE  0.97 11/29/2020 0910   CREATININE 0.9 09/22/2017 0829      Component Value Date/Time   CALCIUM 9.2 11/29/2020 0910   CALCIUM 9.1 09/22/2017 0829   ALKPHOS 64 11/29/2020 0910   ALKPHOS 55 09/22/2017 0829   AST 21 11/29/2020 0910   AST 22 09/22/2017 0829   ALT 26 11/29/2020 0910   ALT 30 09/22/2017 0829   BILITOT 0.3 11/29/2020 0910   BILITOT 0.35 09/22/2017 0230

## 2020-12-20 NOTE — Assessment & Plan Note (Signed)
His last CT imaging showed excellent response to therapy The mass on his neck is healing Per guidelines, We will continue pembrolizumab indefinitely

## 2020-12-20 NOTE — Patient Instructions (Signed)
[facility] Discharge Instructions for Patients Receiving Chemotherapy  Today you received the following chemotherapy agents: Pembrolizumab Beryle Flock)  To help prevent nausea and vomiting after your treatment, we encourage you to take your nausea medication  as prescribed.    If you develop nausea and vomiting that is not controlled by your nausea medication, call the clinic.   BELOW ARE SYMPTOMS THAT SHOULD BE REPORTED IMMEDIATELY:  *FEVER GREATER THAN 100.5 F  *CHILLS WITH OR WITHOUT FEVER  NAUSEA AND VOMITING THAT IS NOT CONTROLLED WITH YOUR NAUSEA MEDICATION  *UNUSUAL SHORTNESS OF BREATH  *UNUSUAL BRUISING OR BLEEDING  TENDERNESS IN MOUTH AND THROAT WITH OR WITHOUT PRESENCE OF ULCERS  *URINARY PROBLEMS  *BOWEL PROBLEMS  UNUSUAL RASH Items with * indicate a potential emergency and should be followed up as soon as possible.  Feel free to call the clinic should you have any questions or concerns. The clinic phone number is [phone].  Please show the [place] at check-in to the Emergency Department and triage nurse.

## 2020-12-20 NOTE — Assessment & Plan Note (Signed)
He stated he is compliant taking Synthroid I will monitor TSH closely and adjust the dose of medicine as needed

## 2020-12-20 NOTE — Telephone Encounter (Signed)
Scheduled appointments per 3/18 sch msg. Pt aware.

## 2020-12-21 LAB — T4: T4, Total: 6.1 ug/dL (ref 4.5–12.0)

## 2021-01-03 ENCOUNTER — Telehealth: Payer: Self-pay

## 2021-01-03 ENCOUNTER — Other Ambulatory Visit: Payer: Self-pay | Admitting: Medical

## 2021-01-03 MED ORDER — OXYCODONE HCL 15 MG PO TABS: 15.0000 mg | ORAL_TABLET | ORAL | 0 refills | Status: DC | PRN

## 2021-01-03 NOTE — Telephone Encounter (Signed)
He called and left a message to call him.  Called back. He is requesting a refill on Oxycodone Rx. Sandi Mealy, PA will send refill request.

## 2021-01-16 ENCOUNTER — Other Ambulatory Visit (HOSPITAL_COMMUNITY): Payer: Self-pay

## 2021-01-16 MED FILL — Metformin HCl Tab 500 MG: ORAL | 30 days supply | Qty: 60 | Fill #0 | Status: AC

## 2021-01-17 ENCOUNTER — Telehealth: Payer: Self-pay

## 2021-01-17 ENCOUNTER — Inpatient Hospital Stay: Payer: Medicaid Other

## 2021-01-17 ENCOUNTER — Inpatient Hospital Stay: Payer: Medicaid Other | Admitting: Hematology and Oncology

## 2021-01-17 ENCOUNTER — Inpatient Hospital Stay: Payer: Medicaid Other | Attending: Hematology and Oncology

## 2021-01-17 ENCOUNTER — Other Ambulatory Visit: Payer: Medicaid Other

## 2021-01-17 ENCOUNTER — Other Ambulatory Visit (HOSPITAL_COMMUNITY): Payer: Self-pay

## 2021-01-17 DIAGNOSIS — Z5112 Encounter for antineoplastic immunotherapy: Secondary | ICD-10-CM | POA: Insufficient documentation

## 2021-01-17 DIAGNOSIS — C119 Malignant neoplasm of nasopharynx, unspecified: Secondary | ICD-10-CM | POA: Insufficient documentation

## 2021-01-17 DIAGNOSIS — Z79899 Other long term (current) drug therapy: Secondary | ICD-10-CM | POA: Insufficient documentation

## 2021-01-17 NOTE — Telephone Encounter (Signed)
He called and left a message. He just woke up. He is asking for the office to reschedule appts. He apologized.

## 2021-01-17 NOTE — Telephone Encounter (Signed)
I have no typed out the letter yet Please send scheduling msg and reschedule

## 2021-01-17 NOTE — Telephone Encounter (Signed)
Scheduling message sent to reschedule today's appts.

## 2021-01-20 ENCOUNTER — Telehealth: Payer: Self-pay | Admitting: Hematology and Oncology

## 2021-01-20 NOTE — Telephone Encounter (Signed)
Scheduled appts per 4/15 sch msg. Pt aware.

## 2021-01-21 ENCOUNTER — Inpatient Hospital Stay: Payer: Medicaid Other

## 2021-01-21 ENCOUNTER — Encounter: Payer: Self-pay | Admitting: Hematology and Oncology

## 2021-01-21 ENCOUNTER — Inpatient Hospital Stay (HOSPITAL_BASED_OUTPATIENT_CLINIC_OR_DEPARTMENT_OTHER): Payer: Medicaid Other | Admitting: Hematology and Oncology

## 2021-01-21 ENCOUNTER — Other Ambulatory Visit: Payer: Self-pay

## 2021-01-21 DIAGNOSIS — C119 Malignant neoplasm of nasopharynx, unspecified: Secondary | ICD-10-CM | POA: Diagnosis present

## 2021-01-21 DIAGNOSIS — C760 Malignant neoplasm of head, face and neck: Secondary | ICD-10-CM

## 2021-01-21 DIAGNOSIS — Z7189 Other specified counseling: Secondary | ICD-10-CM

## 2021-01-21 DIAGNOSIS — Z91199 Patient's noncompliance with other medical treatment and regimen due to unspecified reason: Secondary | ICD-10-CM

## 2021-01-21 DIAGNOSIS — Z95828 Presence of other vascular implants and grafts: Secondary | ICD-10-CM

## 2021-01-21 DIAGNOSIS — Z9119 Patient's noncompliance with other medical treatment and regimen: Secondary | ICD-10-CM

## 2021-01-21 DIAGNOSIS — Z5112 Encounter for antineoplastic immunotherapy: Secondary | ICD-10-CM | POA: Diagnosis not present

## 2021-01-21 DIAGNOSIS — Z79899 Other long term (current) drug therapy: Secondary | ICD-10-CM | POA: Diagnosis not present

## 2021-01-21 DIAGNOSIS — G893 Neoplasm related pain (acute) (chronic): Secondary | ICD-10-CM

## 2021-01-21 DIAGNOSIS — E039 Hypothyroidism, unspecified: Secondary | ICD-10-CM | POA: Diagnosis not present

## 2021-01-21 DIAGNOSIS — C7800 Secondary malignant neoplasm of unspecified lung: Secondary | ICD-10-CM

## 2021-01-21 LAB — CMP (CANCER CENTER ONLY)
ALT: 23 U/L (ref 0–44)
AST: 20 U/L (ref 15–41)
Albumin: 4.1 g/dL (ref 3.5–5.0)
Alkaline Phosphatase: 59 U/L (ref 38–126)
Anion gap: 10 (ref 5–15)
BUN: 13 mg/dL (ref 6–20)
CO2: 27 mmol/L (ref 22–32)
Calcium: 9.1 mg/dL (ref 8.9–10.3)
Chloride: 104 mmol/L (ref 98–111)
Creatinine: 1.01 mg/dL (ref 0.61–1.24)
GFR, Estimated: >60 mL/min (ref >60)
Glucose, Bld: 196 mg/dL — ABNORMAL HIGH (ref 70–99)
Potassium: 3.7 mmol/L (ref 3.5–5.1)
Sodium: 141 mmol/L (ref 135–145)
Total Bilirubin: <0.2 mg/dL — ABNORMAL LOW (ref 0.3–1.2)
Total Protein: 6.7 g/dL (ref 6.5–8.1)

## 2021-01-21 LAB — CBC WITH DIFFERENTIAL (CANCER CENTER ONLY)
Abs Immature Granulocytes: 0.01 10*3/uL (ref 0.00–0.07)
Basophils Absolute: 0 10*3/uL (ref 0.0–0.1)
Basophils Relative: 1 %
Eosinophils Absolute: 0.3 10*3/uL (ref 0.0–0.5)
Eosinophils Relative: 6 %
HCT: 39.6 % (ref 39.0–52.0)
Hemoglobin: 13.2 g/dL (ref 13.0–17.0)
Immature Granulocytes: 0 %
Lymphocytes Relative: 34 %
Lymphs Abs: 1.5 10*3/uL (ref 0.7–4.0)
MCH: 29.2 pg (ref 26.0–34.0)
MCHC: 33.3 g/dL (ref 30.0–36.0)
MCV: 87.6 fL (ref 80.0–100.0)
Monocytes Absolute: 0.5 10*3/uL (ref 0.1–1.0)
Monocytes Relative: 10 %
Neutro Abs: 2.1 10*3/uL (ref 1.7–7.7)
Neutrophils Relative %: 49 %
Platelet Count: 201 10*3/uL (ref 150–400)
RBC: 4.52 MIL/uL (ref 4.22–5.81)
RDW: 13.7 % (ref 11.5–15.5)
WBC Count: 4.4 10*3/uL (ref 4.0–10.5)
nRBC: 0 % (ref 0.0–0.2)

## 2021-01-21 LAB — TSH: TSH: 2.449 u[IU]/mL (ref 0.320–4.118)

## 2021-01-21 MED ORDER — ANTICOAGULANT SODIUM CITRATE 4% (200MG/5ML) IV SOLN
5.0000 mL | Freq: Once | Status: AC
Start: 2021-01-21 — End: 2021-01-21
  Administered 2021-01-21: 5 mL
  Filled 2021-01-21: qty 5

## 2021-01-21 MED ORDER — SODIUM CHLORIDE 0.9% FLUSH
10.0000 mL | Freq: Once | INTRAVENOUS | Status: AC
  Administered 2021-01-21: 10 mL
  Filled 2021-01-21: qty 10

## 2021-01-21 MED ORDER — SODIUM CHLORIDE 0.9 % IV SOLN
Freq: Once | INTRAVENOUS | Status: AC
  Filled 2021-01-21: qty 250

## 2021-01-21 MED ORDER — OXYCODONE HCL 10 MG PO TABS: 10.0000 mg | ORAL_TABLET | ORAL | 0 refills | Status: DC | PRN

## 2021-01-21 MED ORDER — SODIUM CHLORIDE 0.9% FLUSH
10.0000 mL | INTRAVENOUS | Status: DC | PRN
  Administered 2021-01-21: 10 mL
  Filled 2021-01-21: qty 10

## 2021-01-21 MED ORDER — SODIUM CHLORIDE 0.9 % IV SOLN
200.0000 mg | Freq: Once | INTRAVENOUS | Status: AC
  Administered 2021-01-21: 200 mg via INTRAVENOUS
  Filled 2021-01-21: qty 8

## 2021-01-21 NOTE — Assessment & Plan Note (Signed)
He stated he is compliant taking Synthroid I will monitor TSH closely and adjust the dose of medicine as needed

## 2021-01-21 NOTE — Patient Instructions (Signed)
  [facility] Discharge Instructions for Patients Receiving Chemotherapy  Today you received the following chemotherapy agents:  pembroilizumab  To help prevent nausea and vomiting after your treatment, we encourage you to take your nausea medication as directed.   If you develop nausea and vomiting that is not controlled by your nausea medication, call the clinic.   BELOW ARE SYMPTOMS THAT SHOULD BE REPORTED IMMEDIATELY:  *FEVER GREATER THAN 100.5 F  *CHILLS WITH OR WITHOUT FEVER  NAUSEA AND VOMITING THAT IS NOT CONTROLLED WITH YOUR NAUSEA MEDICATION  *UNUSUAL SHORTNESS OF BREATH  *UNUSUAL BRUISING OR BLEEDING  TENDERNESS IN MOUTH AND THROAT WITH OR WITHOUT PRESENCE OF ULCERS  *URINARY PROBLEMS  *BOWEL PROBLEMS  UNUSUAL RASH Items with * indicate a potential emergency and should be followed up as soon as possible.  Feel free to call the clinic should you have any questions or concerns. The clinic phone number is [phone].  Please show the [place] at check-in to the Emergency Department and triage nurse.

## 2021-01-21 NOTE — Progress Notes (Signed)
[facility] OFFICE PROGRESS NOTE  Patient Care Team: Patient, No Pcp Per (Inactive) as PCP - General (General Practice) [name], MD as Consulting Physician (Hematology and Oncology) Eppie Gibson, MD as Attending Physician (Radiation Oncology) Jodi Marble, MD as Consulting Physician (Otolaryngology) Philomena Doheny, MD as Referring Physician (Plastic Surgery) Irene Shipper, MD as Consulting Physician (Gastroenterology) System, Provider Not In  ASSESSMENT & PLAN:  Malignant neoplasm of head and neck [facility]) His last CT imaging showed excellent response to therapy The mass on his neck is healing Per guidelines, We will continue pembrolizumab indefinitely  Medically noncompliant He has missed treatment due to medical noncompliance We discussed the importance of the patient staying on top of his appointments and not miss any doses  Acquired hypothyroidism He stated he is compliant taking Synthroid I will monitor TSH closely and adjust the dose of medicine as needed  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   All questions were answered. The patient knows to call the clinic with any problems, questions or concerns. The total time spent in the appointment was 20 minutes encounter with patients including review of chart and various tests results, discussions about plan of care and coordination of care plan   [name], MD 01/21/2021 1:28 PM  INTERVAL HISTORY: Please see below for problem oriented charting. 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 Pan-endoscopy elsewhere showed cancer from nasopharyngeal space.   01/04/2007 - 02/20/2007 Chemotherapy   He received 2 cycles of cisplatin and 5FU followed by concurrent chemo with weekly cisplatin and radiation. He only received 2 doses of chemo due to severe mucositis, nausea and weight loss.   04/05/2007 - 08/04/2007 Chemotherapy   He received 4 more courses of cisplatin with 5FU and had complete response   07/05/2009 Procedure   Fine-needle aspirate of the right level II lymph nodes come from recurrent metastatic disease. Repeat endoscopy and CT scan show no evidence of disease elsewhere.   07/08/2009 - 12/02/2009 Chemotherapy   He was given 6 cycles of carboplatin, 5-FU and docetaxel   12/03/2009 Surgery   He has surgery to the residual lymph node on the right neck which showed no evidence of disease.   02/22/2012 Imaging   Repeat imaging study showed large recurrent mass. He was referred elsewhere for further treatment.   05/03/2012 Surgery   He underwent left upper lobectomy.   04/29/2013 Imaging   PEt scan  showed lesion on right level II B and lower lung was abnormal   06/03/2013 - 02/02/2014 Chemotherapy   He had 6 cycles of chemotherapy when he was found to have recurrence of cancer and had received oxaliplatin and capecitabine   06/07/2014 Imaging   PET CT scan showed persistent disease in the right neck lymph nodes  and left lung   06/29/2014 Procedure   Accession: [id number] repeat LUL biopsy confirmed metastatic cancer   07/18/2014 - 07/31/2014 Radiation Therapy   He received palliative radiation therapy to the lungs   10/10/2014 Imaging   CT scan of the chest, abdomen and pelvis show regression in the size of the lung nodule in the left upper lobe and stable pulmonary nodules   01/24/2015 Imaging   CT scan showed stable disease in neck and lung   06/19/2015 Imaging   CT scan of the neck and the chest show possible mild progression of the nodule in the right side of the neck.   06/25/2015 Imaging   PET scan confirmed disease recurrence in the neck   07/07/2015 Imaging   He had [facility] neck at [facility]   09/03/2015 - 08/26/2018 Chemotherapy   He received palliative chemo with Nivolumab   10/29/2015 Imaging   PET CT showed positive response to Rx   02/28/2016 Imaging   Ct abdomen showed abnormal thinkening in his stomach   03/03/2016 Imaging   CT: Right sternocleidomastoid muscle metastasis appears less distinct but otherwise not significantly changed in size or configuration since 06/19/2015.2. Left level 3 lymph node which was hypermetabolic by PET-CT in January 2017 appears slightly smaller   04/01/2016 Imaging   CT cervical spine showed no acute fracture or traumatic malalignment in the cervical spine   04/22/2016 Procedure   Port-a-cath placed.   06/16/2016 Imaging   Ct neck showed right sternocleidomastoid muscle metastasis is further decreased in conspicuity since May, and has mildly decreased in size since September 2016. Continued stability of sub-centimeter left cervical lymph nodes. No new or progressive metastatic disease in the neck.   06/16/2016 Imaging   CT chest showed stable masslike radiation fibrosis in the left upper lobe. Stable subcentimeter pulmonary nodules in the bilateral lower lobes. No new or progressive metastatic disease in the chest. Nonobstructing left renal stone.    10/13/2016 Imaging   Ct neck showed unchanged right sternocleidomastoid muscle metastasis. Unchanged subcentimeter left cervical lymph nodes. No evidence of new or progressive metastatic disease in the neck.   10/13/2016 Imaging   CT chest showed tiny hypervascular foci in the liver, not definitely seen on prior imaging of 06/16/2016 and 02/28/2016. Abdomen MRI without and with contrast recommended to further evaluate as metastatic disease is a concern. 2. Stable appearance of post treatment changes left upper lung and scattered tiny bilateral pulmonary nodules.   02/11/2017 Imaging   Ct neck: Lymph node mass right posterior neck appears improved from the prior study. Small posterior lymph nodes on the left unchanged. Occluded right jugular vein unchanged.   02/11/2017 Imaging   1. Similar appearance of postsurgical and radiation changes in the left upper lobe. 2. Similar bilateral pulmonary nodules. 3. No thoracic adenopathy. 4. Subtle foci of post-contrast enhancement within the liver are suboptimally characterized on this nondedicated study. Likely similar. These could either be re-evaluated at followup or more entirely characterized with abdominal MRI. 5. Left nephrolithiasis.   05/19/2017 Imaging   Matted lymph node mass right posterior neck appears larger in the recent CT. Accurate measurements difficult due to  infiltrating tumor margins and infiltration of the muscle. Right jugular vein again appears occluded or resected. Small left posterior lymph nodes stable. Left upper lobe airspace density stable and similar to the prior CT   06/03/2017 PET scan   1. Hypermetabolic ill-defined right level IIb lymph node, about 1.3 cm in diameter with maximum SUV 9.5 (formerly 8.1). Appearance suspicious for residual/recurrent malignancy. No worrisome left-sided lesion. 2. Left suprahilar indistinct opacity demonstrates no worrisome hypermetabolic activity. The 5 mm left lower lobe pulmonary nodule is  stable and not currently hypermetabolic although below sensitive PET-CT size thresholds. 3. Other imaging findings of potential clinical significance: Bilateral nonobstructive nephrolithiasis. Chronic bilateral maxillary sinusitis.   05/11/2018 PET scan   1. Continued chronic accentuated metabolic activity in the vicinity of right level IIB and the adjacent right sternocleidomastoid muscle, with ill definition of surrounding tissue planes. Maximum SUV is currently 8.1, formerly 9.5. Accentuated metabolic activity is been present in this vicinity back through 06/25/2015, and there was also some low-level activity in this vicinity on 06/07/2014. Some of this may be from scarring and local muscular activity although clearly a component of residual tumor is difficult to exclude given the focally high activity. 2. Other imaging findings of potential clinical significance: Chronic bilateral maxillary sinusitis. Chronic scarring in the left upper lobe. Chronically stable 5 mm left lower lobe nodule is considered benign. Nonobstructive left nephrolithiasis.   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 COMMENT:No significant keratinization is identified. Other basaloid carcinomas are in the differential diagnosis. No cell block material is available for further testing.   09/12/2018 Procedure   He underwent fine Needle Aspiration   10/04/2018 PET scan   1. Significant progression of local recurrence laterally in the mid right neck with an enlarging, increasingly hypermetabolic soft tissue mass. This involves the right sternocleidomastoid muscle. 2. Small lymph nodes in the right axilla are increasingly hypermetabolic. These are nonspecific and potentially reactive, although could reflect a small metastases. Small hypermetabolic nodule in the left suprasternal notch  is unchanged. 3. No other evidence of metastatic disease.    10/07/2018 - 12/23/2018 Chemotherapy   The patient had cisplatin plus gemzar   12/07/2018 Imaging   1. Decreased size of lateral right neck mass. 2. Unchanged soft tissue nodule in the suprasternal notch. 3. No evidence of new metastatic disease in the neck.    05/30/2019 Imaging   CT neck No clear change or progression compared to the study of March. Overall measurements of the right lateral neck mass are similar, approximately 3 x 1.8 cm. See above discussion. One could argue that there is slight increase in lateral bulging, possibly with an increase in contrast enhancement, towards the inferior margin. This is of questionable validity but could possibly represent some progression or inflammatory change. Other findings in the region are stable.   07/20/2019 - 09/15/2019 Chemotherapy   The patient had dexamethasone (DECADRON) 4 MG tablet, 1 of 1 cycle, Start date: --, End date: -- palonosetron (ALOXI) injection 0.25 mg, 0.25 mg, Intravenous,  Once, 3 of 4 cycles Administration: 0.25 mg (07/20/2019), 0.25 mg (08/10/2019), 0.25 mg (09/08/2019) CISplatin (PLATINOL) 84 mg in sodium chloride 0.9 % 250 mL chemo infusion, 40 mg/m2 = 84 mg (80 % of original dose 50 mg/m2), Intravenous,  Once, 3 of 4 cycles Dose modification: 40 mg/m2 (80 % of original dose 50 mg/m2, Cycle 1, Reason: Dose  Not Tolerated) Administration: 84 mg (07/20/2019), 84 mg (08/10/2019), 83 mg (09/08/2019) gemcitabine (GEMZAR) 1,600 mg in sodium chloride 0.9 % 250 mL chemo infusion, 1,672 mg (80 % of original dose 1,000 mg/m2), Intravenous,  Once, 3 of 4 cycles Dose modification: 800 mg/m2 (80 % of original dose 1,000 mg/m2, Cycle 1, Reason: Provider Judgment) Administration: 1,600 mg (07/20/2019), 1,600 mg (07/27/2019), 1,600 mg (08/10/2019), 1,600 mg (08/17/2019), 1,600 mg (09/08/2019), 1,672 mg (09/15/2019) ondansetron (ZOFRAN) 8 mg, dexamethasone (DECADRON) 10 mg in sodium  chloride 0.9 % 50 mL IVPB, , Intravenous,  Once, 3 of 4 cycles Administration:  (09/15/2019) fosaprepitant (EMEND) 150 mg, dexamethasone (DECADRON) 12 mg in sodium chloride 0.9 % 145 mL IVPB, , Intravenous,  Once, 3 of 4 cycles Administration:  (07/20/2019),  (08/10/2019),  (09/08/2019)  for chemotherapy treatment.    10/02/2019 Imaging   CT neck As compared to 05/30/2019, no significant interval change in size of an ill-defined mass within the right lateral neck, again measuring 3.3 x 1.8 cm in transaxial dimensions.   Unchanged mildly enlarged left level I lymph node measuring 1.1 cm in short axis.   Unchanged node or nodule at the thoracic inlet, measuring 1.3 x 0.8 cm.   Please refer to concurrently performed chest CT for a description of findings below the level of the thoracic inlet.     10/02/2019 Imaging   CT chest 1. No new or progressive findings in the chest to suggest metastatic disease. 2. Bilateral subcentimeter solid pulmonary nodules are stable since 2018. 3. Hyperdense 1.1 cm anterior liver focus, not clearly visualized on prior studies. Suggest MRI abdomen without and with IV contrast for further characterization.   10/20/2019 - 12/29/2019 Chemotherapy   The patient had ondansetron (ZOFRAN) injection 8 mg, 8 mg (100 % of original dose 8 mg), Intravenous,  Once, 2 of 5 cycles Dose modification: 8 mg (original dose 8 mg, Cycle 2) Administration: 8 mg (11/17/2019), 8 mg (12/15/2019), 8 mg (12/29/2019) gemcitabine (GEMZAR) 2,000 mg in sodium chloride 0.9 % 250 mL chemo infusion, 2,090 mg, Intravenous,  Once, 3 of 6 cycles Administration: 2,000 mg (10/20/2019), 2,000 mg (11/03/2019), 2,000 mg (11/17/2019), 2,000 mg (12/15/2019), 2,000 mg (12/29/2019)  for chemotherapy treatment.    06/12/2020 Imaging   1. Enlarging superficial, exophytic component of the chronic right sternocleidomastoid muscle mass. See series 6, image 55. 2. Elsewhere stable CT appearance of the Neck.   06/12/2020  Imaging   Post treatment scarring in the left hemithorax, stable. No evidence recurrent or metastatic disease   06/14/2020 - 10/15/2020 Chemotherapy   He received carboplatin, 5FU and Beryle Flock       10/31/2020 Procedure   Interval improvement in right lateral lymph node mass. Improvement in dermal component as well as invasion of the right sternocleidomastoid muscle.   10 mm submental lymph node slightly enlarged compared to the prior study. Continued follow-up recommended.   11/01/2020 -  Chemotherapy    Patient is on Treatment Plan: HEAD/NECK PEMBROLIZUMAB Q21D        REVIEW OF SYSTEMS:   Constitutional: Denies fevers, chills or abnormal weight loss Eyes: Denies blurriness of vision Ears, nose, mouth, throat, and face: Denies mucositis or sore throat Respiratory: Denies cough, dyspnea or wheezes Cardiovascular: Denies palpitation, chest discomfort or lower extremity swelling Gastrointestinal:  Denies nausea, heartburn or change in bowel habits Skin: Denies abnormal skin rashes Lymphatics: Denies new lymphadenopathy or easy bruising Neurological:Denies numbness, tingling or new weaknesses Behavioral/Psych: Mood is stable, no new changes  All other  systems were reviewed with the patient and are negative.  I have reviewed the past medical history, past surgical history, social history and family history with the patient and they are unchanged from previous note.  ALLERGIES:  is allergic to phenergan [promethazine hcl], heparin, and clindamycin.  MEDICATIONS:  Current Outpatient Medications  Medication Sig Dispense Refill  . Accu-Chek Softclix Lancets lancets CHECK BLOOD GLUCOSE ONCE A DAY IN THE MORNING. 100 each 12  . atorvastatin (LIPITOR) 40 MG tablet TK 1 T PO  D    . blood glucose meter kit and supplies KIT Dispense based on patient and insurance preference. Use up to four times daily as directed. (FOR ICD-9 250.00, 250.01). 1 each 1  . Blood Glucose Monitoring Suppl  (ACCU-CHEK GUIDE) w/Device KIT USE ONCE DAILY AS DIRECTED IN THE MORNING 1 kit 0  . cetirizine (ZYRTEC) 10 MG tablet TAKE 1 TABLET(10 MG) BY MOUTH DAILY 30 tablet 1  . diclofenac Sodium (VOLTAREN) 1 % GEL Apply topically.    . gabapentin (NEURONTIN) 300 MG capsule Take 1 capsule (300 mg total) by mouth 2 (two) times daily. 60 capsule 11  . glucose blood test strip CHECK BLOOD GLUCOSE ONCE A DAY IN THE MORNING 100 strip 12  . levothyroxine (SYNTHROID) 137 MCG tablet Take 1 tablet (137 mcg total) by mouth daily before breakfast. 90 tablet 9  . levothyroxine (SYNTHROID) 137 MCG tablet TAKE 1 TABLET BY MOUTH DAILY BEFORE BREAKFAST 90 tablet 9  . LORazepam (ATIVAN) 1 MG tablet Take 1 tablet (1 mg total) by mouth every 8 (eight) hours as needed for anxiety. 30 tablet 0  . metFORMIN (GLUCOPHAGE) 500 MG tablet Take 1 tablet (500 mg total) by mouth 2 (two) times daily with a meal. 60 tablet 3  . metFORMIN (GLUCOPHAGE) 500 MG tablet TAKE 1 TABLET BY MOUTH 2 TIMES DAILY WITH A MEAL 60 tablet 2  . omeprazole (PRILOSEC) 40 MG capsule Take 1 capsule (40 mg total) by mouth daily. 30 capsule 9  . ondansetron (ZOFRAN) 8 MG tablet Take 1 tablet (8 mg total) by mouth every 8 (eight) hours as needed for nausea or vomiting. 90 tablet 1  . oxyCODONE 10 MG TABS Take 1 tablet (10 mg total) by mouth every 4 (four) hours as needed. 90 tablet 0  . PARoxetine (PAXIL) 20 MG tablet Take 1 tablet (20 mg total) by mouth daily. 30 tablet 5  . polyethylene glycol (MIRALAX) packet Take 17 g by mouth daily. (Patient taking differently: Take 17 g by mouth daily as needed for mild constipation or moderate constipation. ) 14 each 3  . senna-docusate (SENOKOT-S) 8.6-50 MG tablet Take 2 tablets by mouth 3 (three) times daily. 90 tablet 0  . Vitamin D, Ergocalciferol, (DRISDOL) 1.25 MG (50000 UT) CAPS capsule TK 1 C PO WEEKLY     No current facility-administered medications for this visit.   Facility-Administered Medications Ordered in  Other Visits  Medication Dose Route Frequency Provider Last Rate Last Admin  . anticoagulant sodium citrate solution 5 mL  5 mL Intracatheter Once Alvy Bimler, Katalina Magri, MD      . anticoagulant sodium citrate solution 5 mL  5 mL Intracatheter Once Alvy Bimler, Bernese Doffing, MD      . anticoagulant sodium citrate solution 5 mL  5 mL Intracatheter Once Alvy Bimler, Zakariah Urwin, MD      . pembrolizumab (KEYTRUDA) 200 mg in sodium chloride 0.9 % 50 mL chemo infusion  200 mg Intravenous Once [name], MD      .  sodium chloride flush (NS) 0.9 % injection 10 mL  10 mL Intracatheter PRN Alvy Bimler, Mariam Helbert, MD        PHYSICAL EXAMINATION: ECOG PERFORMANCE STATUS: 1 - Symptomatic but completely ambulatory  Vitals:   01/21/21 1255  BP: 136/72  Pulse: 74  Resp: 18  Temp: 97.6 F (36.4 C)  SpO2: 100%   Filed Weights   01/21/21 1255  Weight: 196 lb (88.9 kg)    GENERAL:alert, no distress and comfortable NEURO: alert & oriented x 3 with fluent speech, no focal motor/sensory deficits  LABORATORY DATA:  I have reviewed the data as listed    Component Value Date/Time   NA 141 01/21/2021 1236   NA 139 09/22/2017 0829   K 3.7 01/21/2021 1236   K 3.5 09/22/2017 0829   CL 104 01/21/2021 1236   CO2 27 01/21/2021 1236   CO2 26 09/22/2017 0829   GLUCOSE 196 (H) 01/21/2021 1236   GLUCOSE 133 09/22/2017 0829   BUN 13 01/21/2021 1236   BUN 14.1 09/22/2017 0829   CREATININE 1.01 01/21/2021 1236   CREATININE 0.9 09/22/2017 0829   CALCIUM 9.1 01/21/2021 1236   CALCIUM 9.1 09/22/2017 0829   PROT 6.7 01/21/2021 1236   PROT 6.8 09/22/2017 0829   ALBUMIN 4.1 01/21/2021 1236   ALBUMIN 4.1 09/22/2017 0829   AST 20 01/21/2021 1236   AST 22 09/22/2017 0829   ALT 23 01/21/2021 1236   ALT 30 09/22/2017 0829   ALKPHOS 59 01/21/2021 1236   ALKPHOS 55 09/22/2017 0829   BILITOT <0.2 (L) 01/21/2021 1236   BILITOT 0.35 09/22/2017 0829   GFRNONAA >60 01/21/2021 1236   GFRAA >60 07/05/2020 0845   GFRAA >60 02/06/2019 1215    No results  found for: SPEP, UPEP  Lab Results  Component Value Date   WBC 4.4 01/21/2021   NEUTROABS 2.1 01/21/2021   HGB 13.2 01/21/2021   HCT 39.6 01/21/2021   MCV 87.6 01/21/2021   PLT 201 01/21/2021      Chemistry      Component Value Date/Time   NA 141 01/21/2021 1236   NA 139 09/22/2017 0829   K 3.7 01/21/2021 1236   K 3.5 09/22/2017 0829   CL 104 01/21/2021 1236   CO2 27 01/21/2021 1236   CO2 26 09/22/2017 0829   BUN 13 01/21/2021 1236   BUN 14.1 09/22/2017 0829   CREATININE 1.01 01/21/2021 1236   CREATININE 0.9 09/22/2017 0829      Component Value Date/Time   CALCIUM 9.1 01/21/2021 1236   CALCIUM 9.1 09/22/2017 0829   ALKPHOS 59 01/21/2021 1236   ALKPHOS 55 09/22/2017 0829   AST 20 01/21/2021 1236   AST 22 09/22/2017 0829   ALT 23 01/21/2021 1236   ALT 30 09/22/2017 0829   BILITOT <0.2 (L) 01/21/2021 1236   BILITOT 0.35 09/22/2017 2574

## 2021-01-21 NOTE — Assessment & Plan Note (Signed)
His last CT imaging showed excellent response to therapy The mass on his neck is healing Per guidelines, We will continue pembrolizumab indefinitely

## 2021-01-21 NOTE — Assessment & Plan Note (Signed)
He has cancer associated pain due to his disease for a long time Since we increased the dose of his oxycodone, with near complete response to treatment, I recommend future medicine taper

## 2021-01-21 NOTE — Assessment & Plan Note (Signed)
He has missed treatment due to medical noncompliance We discussed the importance of the patient staying on top of his appointments and not miss any doses

## 2021-01-22 LAB — T4: T4, Total: 6.1 ug/dL (ref 4.5–12.0)

## 2021-01-24 ENCOUNTER — Other Ambulatory Visit: Payer: Self-pay | Admitting: Medical

## 2021-01-24 ENCOUNTER — Telehealth: Payer: Self-pay

## 2021-01-24 MED ORDER — OXYCODONE HCL 10 MG PO TABS: 10.0000 mg | ORAL_TABLET | ORAL | 0 refills | Status: DC | PRN

## 2021-01-24 NOTE — Telephone Encounter (Signed)
He called and left a message requesting Oxycodone Rx refill. Sandi Mealy, PA sent Rx to pharmacy. Gerard is aware.

## 2021-02-06 ENCOUNTER — Telehealth: Payer: Self-pay

## 2021-02-06 NOTE — Telephone Encounter (Signed)
Spoke with pt regarding his recent letters received from the [facility] contracts for home infusion pumps. RN informed pt that [facility] RN who manages [employer] paperwork with reach out to the vendor. Pt verbalizes understanding.

## 2021-02-14 ENCOUNTER — Inpatient Hospital Stay: Payer: Medicaid Other | Attending: Hematology and Oncology

## 2021-02-14 ENCOUNTER — Encounter: Payer: Self-pay | Admitting: Hematology and Oncology

## 2021-02-14 ENCOUNTER — Other Ambulatory Visit: Payer: Self-pay

## 2021-02-14 ENCOUNTER — Other Ambulatory Visit (HOSPITAL_COMMUNITY): Payer: Self-pay

## 2021-02-14 ENCOUNTER — Inpatient Hospital Stay (HOSPITAL_BASED_OUTPATIENT_CLINIC_OR_DEPARTMENT_OTHER): Payer: Medicaid Other | Admitting: Hematology and Oncology

## 2021-02-14 ENCOUNTER — Inpatient Hospital Stay: Payer: Medicaid Other

## 2021-02-14 DIAGNOSIS — C760 Malignant neoplasm of head, face and neck: Secondary | ICD-10-CM

## 2021-02-14 DIAGNOSIS — E119 Type 2 diabetes mellitus without complications: Secondary | ICD-10-CM

## 2021-02-14 DIAGNOSIS — E039 Hypothyroidism, unspecified: Secondary | ICD-10-CM | POA: Diagnosis not present

## 2021-02-14 DIAGNOSIS — Z5112 Encounter for antineoplastic immunotherapy: Secondary | ICD-10-CM | POA: Insufficient documentation

## 2021-02-14 DIAGNOSIS — C119 Malignant neoplasm of nasopharynx, unspecified: Secondary | ICD-10-CM | POA: Diagnosis present

## 2021-02-14 DIAGNOSIS — Z95828 Presence of other vascular implants and grafts: Secondary | ICD-10-CM

## 2021-02-14 DIAGNOSIS — C7800 Secondary malignant neoplasm of unspecified lung: Secondary | ICD-10-CM

## 2021-02-14 DIAGNOSIS — Z79899 Other long term (current) drug therapy: Secondary | ICD-10-CM | POA: Insufficient documentation

## 2021-02-14 DIAGNOSIS — G893 Neoplasm related pain (acute) (chronic): Secondary | ICD-10-CM

## 2021-02-14 DIAGNOSIS — Z7189 Other specified counseling: Secondary | ICD-10-CM

## 2021-02-14 LAB — TSH: TSH: 0.655 u[IU]/mL (ref 0.320–4.118)

## 2021-02-14 LAB — CBC WITH DIFFERENTIAL (CANCER CENTER ONLY)
Abs Immature Granulocytes: 0 10*3/uL (ref 0.00–0.07)
Basophils Absolute: 0 10*3/uL (ref 0.0–0.1)
Basophils Relative: 1 %
Eosinophils Absolute: 0.2 10*3/uL (ref 0.0–0.5)
Eosinophils Relative: 5 %
HCT: 40.7 % (ref 39.0–52.0)
Hemoglobin: 13.3 g/dL (ref 13.0–17.0)
Immature Granulocytes: 0 %
Lymphocytes Relative: 30 %
Lymphs Abs: 1.5 10*3/uL (ref 0.7–4.0)
MCH: 28.9 pg (ref 26.0–34.0)
MCHC: 32.7 g/dL (ref 30.0–36.0)
MCV: 88.3 fL (ref 80.0–100.0)
Monocytes Absolute: 0.4 10*3/uL (ref 0.1–1.0)
Monocytes Relative: 7 %
Neutro Abs: 3 10*3/uL (ref 1.7–7.7)
Neutrophils Relative %: 57 %
Platelet Count: 223 10*3/uL (ref 150–400)
RBC: 4.61 MIL/uL (ref 4.22–5.81)
RDW: 14.7 % (ref 11.5–15.5)
WBC Count: 5.2 10*3/uL (ref 4.0–10.5)
nRBC: 0 % (ref 0.0–0.2)

## 2021-02-14 LAB — CMP (CANCER CENTER ONLY)
ALT: 18 U/L (ref 0–44)
AST: 24 U/L (ref 15–41)
Albumin: 4.2 g/dL (ref 3.5–5.0)
Alkaline Phosphatase: 60 U/L (ref 38–126)
Anion gap: 10 (ref 5–15)
BUN: 17 mg/dL (ref 6–20)
CO2: 27 mmol/L (ref 22–32)
Calcium: 9.4 mg/dL (ref 8.9–10.3)
Chloride: 104 mmol/L (ref 98–111)
Creatinine: 1.02 mg/dL (ref 0.61–1.24)
GFR, Estimated: >60 mL/min (ref >60)
Glucose, Bld: 173 mg/dL — ABNORMAL HIGH (ref 70–99)
Potassium: 3.6 mmol/L (ref 3.5–5.1)
Sodium: 141 mmol/L (ref 135–145)
Total Bilirubin: 0.3 mg/dL (ref 0.3–1.2)
Total Protein: 6.9 g/dL (ref 6.5–8.1)

## 2021-02-14 MED ORDER — SODIUM CHLORIDE 0.9 % IV SOLN
200.0000 mg | Freq: Once | INTRAVENOUS | Status: AC
  Administered 2021-02-14: 200 mg via INTRAVENOUS
  Filled 2021-02-14: qty 8

## 2021-02-14 MED ORDER — SODIUM CHLORIDE 0.9% FLUSH
10.0000 mL | INTRAVENOUS | Status: DC | PRN
  Administered 2021-02-14: 10 mL
  Filled 2021-02-14: qty 10

## 2021-02-14 MED ORDER — OXYCODONE HCL 5 MG PO TABS
5.0000 mg | ORAL_TABLET | Freq: Four times a day (QID) | ORAL | 0 refills | Status: DC | PRN
  Filled 2021-02-14: qty 90, 22d supply, fill #0

## 2021-02-14 MED ORDER — SODIUM CHLORIDE 0.9 % IV SOLN
Freq: Once | INTRAVENOUS | Status: AC
Start: 2021-02-14 — End: 2021-02-14
  Filled 2021-02-14: qty 250

## 2021-02-14 MED ORDER — SODIUM CHLORIDE 0.9% FLUSH
10.0000 mL | Freq: Once | INTRAVENOUS | Status: AC | PRN
  Administered 2021-02-14: 10 mL
  Filled 2021-02-14: qty 10

## 2021-02-14 MED ORDER — ANTICOAGULANT SODIUM CITRATE 4% (200MG/5ML) IV SOLN
5.0000 mL | Freq: Once | Status: AC
Start: 2021-02-14 — End: 2021-02-14
  Administered 2021-02-14: 5 mL
  Filled 2021-02-14: qty 5

## 2021-02-14 NOTE — Progress Notes (Signed)
[place] OFFICE PROGRESS NOTE  Patient Care Team: Patient, No Pcp Per (Inactive) as PCP - General (General Practice) [name], MD as Consulting Physician (Hematology and Oncology) Eppie Gibson, MD as Attending Physician (Radiation Oncology) Jodi Marble, MD as Consulting Physician (Otolaryngology) Philomena Doheny, MD as Referring Physician (Plastic Surgery) Irene Shipper, MD as Consulting Physician (Gastroenterology) System, Provider Not In  ASSESSMENT & PLAN:  Malignant neoplasm of head and neck [facility]) His last CT imaging showed excellent response to therapy The mass on his neck is gone Per guidelines, We will continue pembrolizumab indefinitely  Cancer associated pain He is interested for oxycodone taper I plan to reduce oxycodone to 5 mg tablet  Acquired hypothyroidism He stated he is compliant taking Synthroid I will monitor TSH closely and adjust the dose of medicine as needed  Type 2 diabetes mellitus ([place]) His home blood sugar monitoring is borderline high He tolerated Metformin well He has lost some weight I recommend he continue his aggressive lifestyle changes and weight loss   No orders of the defined types were placed in this encounter.   All questions were answered. The patient knows to call the clinic with any problems, questions or concerns. The total time spent in the appointment was 20 minutes encounter with patients including review of chart and various tests results, discussions about plan of care and coordination of care plan   [name], MD 02/14/2021 12:46 PM  INTERVAL HISTORY: Please see below for problem oriented charting. 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 Pan-endoscopy elsewhere showed cancer from nasopharyngeal space.   01/04/2007 - 02/20/2007 Chemotherapy   He received 2 cycles of cisplatin and 5FU followed by concurrent chemo with weekly cisplatin and radiation. He only received 2 doses of chemo due to severe mucositis, nausea and weight loss.   04/05/2007 - 08/04/2007 Chemotherapy   He received 4 more courses of cisplatin with 5FU and had complete response   07/05/2009 Procedure   Fine-needle aspirate of the right level II lymph nodes come from recurrent metastatic disease. Repeat endoscopy and CT scan show no evidence of disease elsewhere.   07/08/2009 - 12/02/2009 Chemotherapy   He was given 6 cycles of carboplatin, 5-FU and docetaxel   12/03/2009 Surgery   He has surgery to the residual lymph node on the right neck which showed no evidence of disease.   02/22/2012 Imaging   Repeat imaging study showed large recurrent mass. He was referred elsewhere for further treatment.   05/03/2012 Surgery   He underwent left upper lobectomy.   04/29/2013 Imaging   PEt scan showed lesion on right level II B and lower lung was abnormal   06/03/2013 - 02/02/2014 Chemotherapy   He had 6 cycles of chemotherapy when he was found to have recurrence of cancer  and had received oxaliplatin and capecitabine   06/07/2014 Imaging   PET CT scan showed persistent disease in the right neck lymph nodes and left lung   06/29/2014 Procedure   Accession: [id number] repeat LUL biopsy confirmed metastatic cancer   07/18/2014 - 07/31/2014 Radiation Therapy   He received palliative radiation  therapy to the lungs   10/10/2014 Imaging   CT scan of the chest, abdomen and pelvis show regression in the size of the lung nodule in the left upper lobe and stable pulmonary nodules   01/24/2015 Imaging   CT scan showed stable disease in neck and lung   06/19/2015 Imaging   CT scan of the neck and the chest show possible mild progression of the nodule in the right side of the neck.   06/25/2015 Imaging   PET scan confirmed disease recurrence in the neck   07/07/2015 Imaging   He had [facility] neck [facility]   09/03/2015 - 08/26/2018 Chemotherapy   He received palliative chemo with Nivolumab   10/29/2015 Imaging   PET CT showed positive response to Rx   02/28/2016 Imaging   Ct abdomen showed abnormal thinkening in his stomach   03/03/2016 Imaging   CT: Right sternocleidomastoid muscle metastasis appears less distinct but otherwise not significantly changed in size or configuration since 06/19/2015.2. Left level 3 lymph node which was hypermetabolic by PET-CT in January 2017 appears slightly smaller   04/01/2016 Imaging   CT cervical spine showed no acute fracture or traumatic malalignment in the cervical spine   04/22/2016 Procedure   Port-a-cath placed.   06/16/2016 Imaging   Ct neck showed right sternocleidomastoid muscle metastasis is further decreased in conspicuity since May, and has mildly decreased in size since September 2016. Continued stability of sub-centimeter left cervical lymph nodes. No new or progressive metastatic disease in the neck.   06/16/2016 Imaging   CT chest showed stable masslike radiation fibrosis in the left upper lobe. Stable subcentimeter pulmonary nodules in the bilateral lower lobes. No new or progressive metastatic disease in the chest. Nonobstructing left renal stone.   10/13/2016 Imaging   Ct neck showed unchanged right sternocleidomastoid muscle metastasis. Unchanged subcentimeter left cervical lymph nodes. No evidence of new or progressive metastatic disease  in the neck.   10/13/2016 Imaging   CT chest showed tiny hypervascular foci in the liver, not definitely seen on prior imaging of 06/16/2016 and 02/28/2016. Abdomen MRI without and with contrast recommended to further evaluate as metastatic disease is a concern. 2. Stable appearance of post treatment changes left upper lung and scattered tiny bilateral pulmonary nodules.   02/11/2017 Imaging   Ct neck: Lymph node mass right posterior neck appears improved from the prior study. Small posterior lymph nodes on the left unchanged. Occluded right jugular vein unchanged.   02/11/2017 Imaging   1. Similar appearance of postsurgical and radiation changes in the left upper lobe. 2. Similar bilateral pulmonary nodules. 3. No thoracic adenopathy. 4. Subtle foci of post-contrast enhancement within the liver are suboptimally characterized on this nondedicated study. Likely similar. These could either be re-evaluated at followup or more entirely characterized with abdominal MRI. 5. Left nephrolithiasis.   05/19/2017 Imaging   Matted lymph node mass right posterior neck appears larger in the recent CT. Accurate measurements difficult due to infiltrating tumor margins and infiltration of the muscle. Right jugular vein again appears occluded or resected. Small left posterior lymph nodes stable. Left upper lobe airspace density stable and similar to the prior CT  06/03/2017 PET scan   1. Hypermetabolic ill-defined right level IIb lymph node, about 1.3 cm in diameter with maximum SUV 9.5 (formerly 8.1). Appearance suspicious for residual/recurrent malignancy. No worrisome left-sided lesion. 2. Left suprahilar indistinct opacity demonstrates no worrisome hypermetabolic activity. The 5 mm left lower lobe pulmonary nodule is stable and not currently hypermetabolic although below sensitive PET-CT size thresholds. 3. Other imaging findings of potential clinical significance: Bilateral nonobstructive nephrolithiasis.  Chronic bilateral maxillary sinusitis.   05/11/2018 PET scan   1. Continued chronic accentuated metabolic activity in the vicinity of right level IIB and the adjacent right sternocleidomastoid muscle, with ill definition of surrounding tissue planes. Maximum SUV is currently 8.1, formerly 9.5. Accentuated metabolic activity is been present in this vicinity back through 06/25/2015, and there was also some low-level activity in this vicinity on 06/07/2014. Some of this may be from scarring and local muscular activity although clearly a component of residual tumor is difficult to exclude given the focally high activity. 2. Other imaging findings of potential clinical significance: Chronic bilateral maxillary sinusitis. Chronic scarring in the left upper lobe. Chronically stable 5 mm left lower lobe nodule is considered benign. Nonobstructive left nephrolithiasis.   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 COMMENT:No significant keratinization is identified. Other basaloid carcinomas are in the differential diagnosis. No cell block material is available for further testing.   09/12/2018 Procedure   He underwent fine Needle Aspiration   10/04/2018 PET scan   1. Significant progression of local recurrence laterally in the mid right neck with an enlarging, increasingly hypermetabolic soft tissue mass. This involves the right sternocleidomastoid muscle. 2. Small lymph nodes in the right axilla are increasingly hypermetabolic. These are nonspecific and potentially reactive, although could reflect a small metastases. Small hypermetabolic nodule in the left suprasternal notch is unchanged. 3. No other evidence of metastatic disease.    10/07/2018 - 12/23/2018 Chemotherapy   The patient had cisplatin plus gemzar   12/07/2018 Imaging   1. Decreased size of lateral  right neck mass. 2. Unchanged soft tissue nodule in the suprasternal notch. 3. No evidence of new metastatic disease in the neck.    05/30/2019 Imaging   CT neck No clear change or progression compared to the study of March. Overall measurements of the right lateral neck mass are similar, approximately 3 x 1.8 cm. See above discussion. One could argue that there is slight increase in lateral bulging, possibly with an increase in contrast enhancement, towards the inferior margin. This is of questionable validity but could possibly represent some progression or inflammatory change. Other findings in the region are stable.   07/20/2019 - 09/15/2019 Chemotherapy   The patient had dexamethasone (DECADRON) 4 MG tablet, 1 of 1 cycle, Start date: --, End date: -- palonosetron (ALOXI) injection 0.25 mg, 0.25 mg, Intravenous,  Once, 3 of 4 cycles Administration: 0.25 mg (07/20/2019), 0.25 mg (08/10/2019), 0.25 mg (09/08/2019) CISplatin (PLATINOL) 84 mg in sodium chloride 0.9 % 250 mL chemo infusion, 40 mg/m2 = 84 mg (80 % of original dose 50 mg/m2), Intravenous,  Once, 3 of 4 cycles Dose modification: 40 mg/m2 (80 % of original dose 50 mg/m2, Cycle 1, Reason: Dose Not Tolerated) Administration: 84 mg (07/20/2019), 84 mg (08/10/2019), 83 mg (09/08/2019) gemcitabine (GEMZAR) 1,600 mg in sodium chloride 0.9 % 250 mL chemo infusion, 1,672 mg (80 % of original dose 1,000 mg/m2), Intravenous,  Once, 3 of 4 cycles Dose modification: 800 mg/m2 (80 % of original dose 1,000 mg/m2, Cycle 1, Reason: Provider Judgment) Administration: 1,600 mg (07/20/2019), 1,600 mg (07/27/2019), 1,600 mg (08/10/2019), 1,600 mg (08/17/2019), 1,600 mg (09/08/2019), 1,672 mg (09/15/2019) ondansetron (ZOFRAN) 8 mg, dexamethasone (DECADRON) 10 mg in sodium chloride 0.9 % 50 mL IVPB, , Intravenous,  Once, 3 of 4 cycles Administration:  (09/15/2019) fosaprepitant (EMEND) 150 mg, dexamethasone (DECADRON) 12 mg in sodium chloride 0.9 % 145 mL IVPB,  , Intravenous,  Once, 3 of 4 cycles Administration:  (07/20/2019),  (08/10/2019),  (09/08/2019)  for chemotherapy treatment.    10/02/2019 Imaging   CT neck As compared to 05/30/2019, no significant interval change in size of an ill-defined mass within the right lateral neck, again measuring 3.3 x 1.8 cm in transaxial dimensions.   Unchanged mildly enlarged left level I lymph node measuring 1.1 cm in short axis.   Unchanged node or nodule at the thoracic inlet, measuring 1.3 x 0.8 cm.   Please refer to concurrently performed chest CT for a description of findings below the level of the thoracic inlet.     10/02/2019 Imaging   CT chest 1. No new or progressive findings in the chest to suggest metastatic disease. 2. Bilateral subcentimeter solid pulmonary nodules are stable since 2018. 3. Hyperdense 1.1 cm anterior liver focus, not clearly visualized on prior studies. Suggest MRI abdomen without and with IV contrast for further characterization.   10/20/2019 - 12/29/2019 Chemotherapy   The patient had ondansetron (ZOFRAN) injection 8 mg, 8 mg (100 % of original dose 8 mg), Intravenous,  Once, 2 of 5 cycles Dose modification: 8 mg (original dose 8 mg, Cycle 2) Administration: 8 mg (11/17/2019), 8 mg (12/15/2019), 8 mg (12/29/2019) gemcitabine (GEMZAR) 2,000 mg in sodium chloride 0.9 % 250 mL chemo infusion, 2,090 mg, Intravenous,  Once, 3 of 6 cycles Administration: 2,000 mg (10/20/2019), 2,000 mg (11/03/2019), 2,000 mg (11/17/2019), 2,000 mg (12/15/2019), 2,000 mg (12/29/2019)  for chemotherapy treatment.    06/12/2020 Imaging   1. Enlarging superficial, exophytic component of the chronic right sternocleidomastoid muscle mass. See series 6, image 55. 2. Elsewhere stable CT appearance of the Neck.   06/12/2020 Imaging   Post treatment scarring in the left hemithorax, stable. No evidence recurrent or metastatic disease   06/14/2020 - 10/15/2020 Chemotherapy   He received carboplatin, 5FU and  Beryle Flock       10/31/2020 Procedure   Interval improvement in right lateral lymph node mass. Improvement in dermal component as well as invasion of the right sternocleidomastoid muscle.   10 mm submental lymph node slightly enlarged compared to the prior study. Continued follow-up recommended.   11/01/2020 -  Chemotherapy    Patient is on Treatment Plan: HEAD/NECK PEMBROLIZUMAB Q21D        REVIEW OF SYSTEMS:   Constitutional: Denies fevers, chills or abnormal weight loss Eyes: Denies blurriness of vision Ears, nose, mouth, throat, and face: Denies mucositis or sore throat Respiratory: Denies cough, dyspnea or wheezes Cardiovascular: Denies palpitation, chest discomfort or lower extremity swelling Gastrointestinal:  Denies nausea, heartburn or change in bowel habits Skin: Denies abnormal skin rashes Lymphatics: Denies new lymphadenopathy or easy bruising Neurological:Denies numbness, tingling or new weaknesses Behavioral/Psych: Mood is stable, no new changes  All other systems were reviewed with the patient and are negative.  I have reviewed the past medical history, past surgical history, social history and family history with the patient and they are unchanged from previous note.  ALLERGIES:  is allergic to phenergan [promethazine hcl], heparin, and clindamycin.  MEDICATIONS:  Current Outpatient Medications  Medication Sig Dispense Refill  . oxyCODONE (OXY IR/ROXICODONE) 5 MG immediate release tablet Take 1 tablet (5 mg total) by mouth every 6 (six) hours as needed for severe pain. 90 tablet 0  . Accu-Chek Softclix Lancets lancets CHECK BLOOD GLUCOSE ONCE A DAY IN THE MORNING. 100 each 12  . atorvastatin (LIPITOR) 40 MG tablet TK 1 T PO  D    . blood glucose meter kit and supplies KIT Dispense based on patient and insurance preference. Use up to four times daily as directed. (FOR ICD-9 250.00, 250.01). 1 each 1  . Blood Glucose Monitoring Suppl (ACCU-CHEK GUIDE) w/Device KIT  USE ONCE DAILY AS DIRECTED IN THE MORNING 1 kit 0  . cetirizine (ZYRTEC) 10 MG tablet TAKE 1 TABLET(10 MG) BY MOUTH DAILY 30 tablet 1  . diclofenac Sodium (VOLTAREN) 1 % GEL Apply topically.    . gabapentin (NEURONTIN) 300 MG capsule Take 1 capsule (300 mg total) by mouth 2 (two) times daily. 60 capsule 11  . glucose blood test strip CHECK BLOOD GLUCOSE ONCE A DAY IN THE MORNING 100 strip 12  . levothyroxine (SYNTHROID) 137 MCG tablet TAKE 1 TABLET BY MOUTH DAILY BEFORE BREAKFAST 90 tablet 9  . LORazepam (ATIVAN) 1 MG tablet Take 1 tablet (1 mg total) by mouth every 8 (eight) hours as needed for anxiety. 30 tablet 0  . metFORMIN (GLUCOPHAGE) 500 MG tablet TAKE 1 TABLET BY MOUTH 2 TIMES DAILY WITH A MEAL 60 tablet 2  . omeprazole (PRILOSEC) 40 MG capsule Take 1 capsule (40 mg total) by mouth daily. 30 capsule 9  . ondansetron (ZOFRAN) 8 MG tablet Take 1 tablet (8 mg total) by mouth every 8 (eight) hours as needed for nausea or vomiting. 90 tablet 1  . PARoxetine (PAXIL) 20 MG tablet Take 1 tablet (20 mg total) by mouth daily. 30 tablet 5  . polyethylene glycol (MIRALAX) packet Take 17 g by mouth daily. (Patient taking differently: Take 17 g by mouth daily as needed for mild constipation or moderate constipation. ) 14 each 3  . senna-docusate (SENOKOT-S) 8.6-50 MG tablet Take 2 tablets by mouth 3 (three) times daily. 90 tablet 0  . Vitamin D, Ergocalciferol, (DRISDOL) 1.25 MG (50000 UT) CAPS capsule TK 1 C PO WEEKLY     No current facility-administered medications for this visit.   Facility-Administered Medications Ordered in Other Visits  Medication Dose Route Frequency Provider Last Rate Last Admin  . anticoagulant sodium citrate solution 5 mL  5 mL Intracatheter Once Alvy Bimler, Icela Glymph, MD      . anticoagulant sodium citrate solution 5 mL  5 mL Intracatheter Once Alvy Bimler, Arriona Prest, MD      . anticoagulant sodium citrate solution 5 mL  5 mL Intracatheter Once Alvy Bimler, Batoul Limes, MD      . pembrolizumab (KEYTRUDA)  200 mg in sodium chloride 0.9 % 50 mL chemo infusion  200 mg Intravenous Once [name], MD 116 mL/hr at 02/14/21 1225 200 mg at 02/14/21 1225  . sodium chloride flush (NS) 0.9 % injection 10 mL  10 mL Intracatheter PRN Alvy Bimler, Desmund Elman, MD        PHYSICAL EXAMINATION: ECOG PERFORMANCE STATUS: 0 - Asymptomatic  Vitals:   02/14/21 1127  BP: (!) 141/76  Pulse: 91  Resp: 18  Temp: 97.9 F (36.6 C)  SpO2: 100%   Filed Weights   02/14/21 1127  Weight: 190  lb 12.8 oz (86.5 kg)    GENERAL:alert, no distress and comfortable SKIN: skin color, texture, turgor are normal, no rashes or significant lesions EYES: normal, Conjunctiva are pink and non-injected, sclera clear OROPHARYNX:no exudate, no erythema and lips, buccal mucosa, and tongue normal  NECK: Noted signs of neck fibrosis LYMPH:  no palpable lymphadenopathy in the cervical, axillary or inguinal LUNGS: clear to auscultation and percussion with normal breathing effort HEART: regular rate & rhythm and no murmurs and no lower extremity edema ABDOMEN:abdomen soft, non-tender and normal bowel sounds Musculoskeletal:no cyanosis of digits and no clubbing  NEURO: alert & oriented x 3 with fluent speech, no focal motor/sensory deficits  LABORATORY DATA:  I have reviewed the data as listed    Component Value Date/Time   NA 141 02/14/2021 1058   NA 139 09/22/2017 0829   K 3.6 02/14/2021 1058   K 3.5 09/22/2017 0829   CL 104 02/14/2021 1058   CO2 27 02/14/2021 1058   CO2 26 09/22/2017 0829   GLUCOSE 173 (H) 02/14/2021 1058   GLUCOSE 133 09/22/2017 0829   BUN 17 02/14/2021 1058   BUN 14.1 09/22/2017 0829   CREATININE 1.02 02/14/2021 1058   CREATININE 0.9 09/22/2017 0829   CALCIUM 9.4 02/14/2021 1058   CALCIUM 9.1 09/22/2017 0829   PROT 6.9 02/14/2021 1058   PROT 6.8 09/22/2017 0829   ALBUMIN 4.2 02/14/2021 1058   ALBUMIN 4.1 09/22/2017 0829   AST 24 02/14/2021 1058   AST 22 09/22/2017 0829   ALT 18 02/14/2021 1058   ALT 30  09/22/2017 0829   ALKPHOS 60 02/14/2021 1058   ALKPHOS 55 09/22/2017 0829   BILITOT 0.3 02/14/2021 1058   BILITOT 0.35 09/22/2017 0829   GFRNONAA >60 02/14/2021 1058   GFRAA >60 07/05/2020 0845   GFRAA >60 02/06/2019 1215    No results found for: SPEP, UPEP  Lab Results  Component Value Date   WBC 5.2 02/14/2021   NEUTROABS 3.0 02/14/2021   HGB 13.3 02/14/2021   HCT 40.7 02/14/2021   MCV 88.3 02/14/2021   PLT 223 02/14/2021      Chemistry      Component Value Date/Time   NA 141 02/14/2021 1058   NA 139 09/22/2017 0829   K 3.6 02/14/2021 1058   K 3.5 09/22/2017 0829   CL 104 02/14/2021 1058   CO2 27 02/14/2021 1058   CO2 26 09/22/2017 0829   BUN 17 02/14/2021 1058   BUN 14.1 09/22/2017 0829   CREATININE 1.02 02/14/2021 1058   CREATININE 0.9 09/22/2017 0829      Component Value Date/Time   CALCIUM 9.4 02/14/2021 1058   CALCIUM 9.1 09/22/2017 0829   ALKPHOS 60 02/14/2021 1058   ALKPHOS 55 09/22/2017 0829   AST 24 02/14/2021 1058   AST 22 09/22/2017 0829   ALT 18 02/14/2021 1058   ALT 30 09/22/2017 0829   BILITOT 0.3 02/14/2021 1058   BILITOT 0.35 09/22/2017 0829

## 2021-02-14 NOTE — Assessment & Plan Note (Signed)
He stated he is compliant taking Synthroid I will monitor TSH closely and adjust the dose of medicine as needed

## 2021-02-14 NOTE — Assessment & Plan Note (Signed)
He is interested for oxycodone taper I plan to reduce oxycodone to 5 mg tablet

## 2021-02-14 NOTE — Assessment & Plan Note (Signed)
His home blood sugar monitoring is borderline high He tolerated Metformin well He has lost some weight I recommend he continue his aggressive lifestyle changes and weight loss

## 2021-02-14 NOTE — Assessment & Plan Note (Signed)
His last CT imaging showed excellent response to therapy The mass on his neck is gone Per guidelines, We will continue pembrolizumab indefinitely

## 2021-02-15 LAB — T4: T4, Total: 6.6 ug/dL (ref 4.5–12.0)

## 2021-02-19 ENCOUNTER — Other Ambulatory Visit (HOSPITAL_COMMUNITY): Payer: Self-pay

## 2021-02-19 MED FILL — Metformin HCl Tab 500 MG: ORAL | 30 days supply | Qty: 60 | Fill #1 | Status: CN

## 2021-02-27 ENCOUNTER — Other Ambulatory Visit (HOSPITAL_COMMUNITY): Payer: Self-pay

## 2021-03-04 ENCOUNTER — Other Ambulatory Visit (HOSPITAL_COMMUNITY): Payer: Self-pay

## 2021-03-04 ENCOUNTER — Telehealth: Payer: Self-pay

## 2021-03-04 ENCOUNTER — Other Ambulatory Visit: Payer: Self-pay | Admitting: Hematology and Oncology

## 2021-03-04 MED ORDER — OXYCODONE HCL 5 MG PO TABS
5.0000 mg | ORAL_TABLET | Freq: Four times a day (QID) | ORAL | 0 refills | Status: DC | PRN
  Filled 2021-03-04 – 2021-03-06 (×2): qty 90, 23d supply, fill #0

## 2021-03-04 NOTE — Telephone Encounter (Signed)
Patient requesting refill on Oxycodone 5mg  tablets.  Last Rx given on 5/13.

## 2021-03-04 NOTE — Telephone Encounter (Signed)
Can you call him quick and verify which pharmacy? My preference is with [facility]; other pharmacy may not stock that much pain meds

## 2021-03-04 NOTE — Telephone Encounter (Signed)
I just sent to [facility] If he calls back, let him know

## 2021-03-06 ENCOUNTER — Other Ambulatory Visit (HOSPITAL_COMMUNITY): Payer: Self-pay

## 2021-03-06 MED FILL — Metformin HCl Tab 500 MG: ORAL | 30 days supply | Qty: 60 | Fill #1 | Status: AC

## 2021-03-07 ENCOUNTER — Inpatient Hospital Stay: Payer: Medicaid Other

## 2021-03-07 ENCOUNTER — Inpatient Hospital Stay: Payer: Medicaid Other | Attending: Hematology and Oncology | Admitting: Hematology and Oncology

## 2021-03-07 ENCOUNTER — Encounter: Payer: Self-pay | Admitting: Hematology and Oncology

## 2021-03-07 ENCOUNTER — Other Ambulatory Visit (HOSPITAL_COMMUNITY): Payer: Self-pay

## 2021-03-07 ENCOUNTER — Other Ambulatory Visit: Payer: Self-pay

## 2021-03-07 ENCOUNTER — Telehealth: Payer: Self-pay | Admitting: Hematology and Oncology

## 2021-03-07 DIAGNOSIS — C7800 Secondary malignant neoplasm of unspecified lung: Secondary | ICD-10-CM

## 2021-03-07 DIAGNOSIS — Z95828 Presence of other vascular implants and grafts: Secondary | ICD-10-CM

## 2021-03-07 DIAGNOSIS — C760 Malignant neoplasm of head, face and neck: Secondary | ICD-10-CM

## 2021-03-07 DIAGNOSIS — Z5112 Encounter for antineoplastic immunotherapy: Secondary | ICD-10-CM | POA: Diagnosis present

## 2021-03-07 DIAGNOSIS — Z7189 Other specified counseling: Secondary | ICD-10-CM

## 2021-03-07 DIAGNOSIS — G893 Neoplasm related pain (acute) (chronic): Secondary | ICD-10-CM

## 2021-03-07 DIAGNOSIS — E039 Hypothyroidism, unspecified: Secondary | ICD-10-CM

## 2021-03-07 DIAGNOSIS — E119 Type 2 diabetes mellitus without complications: Secondary | ICD-10-CM

## 2021-03-07 DIAGNOSIS — C119 Malignant neoplasm of nasopharynx, unspecified: Secondary | ICD-10-CM | POA: Insufficient documentation

## 2021-03-07 DIAGNOSIS — Z79899 Other long term (current) drug therapy: Secondary | ICD-10-CM | POA: Insufficient documentation

## 2021-03-07 LAB — TSH: TSH: 3.762 u[IU]/mL (ref 0.320–4.118)

## 2021-03-07 LAB — CMP (CANCER CENTER ONLY)
ALT: 17 U/L (ref 0–44)
AST: 20 U/L (ref 15–41)
Albumin: 4 g/dL (ref 3.5–5.0)
Alkaline Phosphatase: 59 U/L (ref 38–126)
Anion gap: 12 (ref 5–15)
BUN: 12 mg/dL (ref 6–20)
CO2: 25 mmol/L (ref 22–32)
Calcium: 9.4 mg/dL (ref 8.9–10.3)
Chloride: 104 mmol/L (ref 98–111)
Creatinine: 0.92 mg/dL (ref 0.61–1.24)
GFR, Estimated: >60 mL/min (ref >60)
Glucose, Bld: 112 mg/dL — ABNORMAL HIGH (ref 70–99)
Potassium: 3.4 mmol/L — ABNORMAL LOW (ref 3.5–5.1)
Sodium: 141 mmol/L (ref 135–145)
Total Bilirubin: 0.3 mg/dL (ref 0.3–1.2)
Total Protein: 6.6 g/dL (ref 6.5–8.1)

## 2021-03-07 LAB — CBC WITH DIFFERENTIAL (CANCER CENTER ONLY)
Abs Immature Granulocytes: 0.01 10*3/uL (ref 0.00–0.07)
Basophils Absolute: 0 10*3/uL (ref 0.0–0.1)
Basophils Relative: 1 %
Eosinophils Absolute: 0.2 10*3/uL (ref 0.0–0.5)
Eosinophils Relative: 5 %
HCT: 39.7 % (ref 39.0–52.0)
Hemoglobin: 13.5 g/dL (ref 13.0–17.0)
Immature Granulocytes: 0 %
Lymphocytes Relative: 37 %
Lymphs Abs: 2 10*3/uL (ref 0.7–4.0)
MCH: 29.2 pg (ref 26.0–34.0)
MCHC: 34 g/dL (ref 30.0–36.0)
MCV: 85.9 fL (ref 80.0–100.0)
Monocytes Absolute: 0.5 10*3/uL (ref 0.1–1.0)
Monocytes Relative: 10 %
Neutro Abs: 2.6 10*3/uL (ref 1.7–7.7)
Neutrophils Relative %: 47 %
Platelet Count: 231 10*3/uL (ref 150–400)
RBC: 4.62 MIL/uL (ref 4.22–5.81)
RDW: 15.1 % (ref 11.5–15.5)
WBC Count: 5.4 10*3/uL (ref 4.0–10.5)
nRBC: 0 % (ref 0.0–0.2)

## 2021-03-07 MED ORDER — ANTICOAGULANT SODIUM CITRATE 4% (200MG/5ML) IV SOLN
5.0000 mL | Freq: Once | Status: AC
Start: 2021-03-07 — End: 2021-03-07
  Administered 2021-03-07: 5 mL
  Filled 2021-03-07: qty 5

## 2021-03-07 MED ORDER — ONDANSETRON HCL 4 MG/2ML IJ SOLN
8.0000 mg | Freq: Once | INTRAMUSCULAR | Status: AC
  Administered 2021-03-07: 8 mg via INTRAVENOUS

## 2021-03-07 MED ORDER — SODIUM CHLORIDE 0.9% FLUSH
10.0000 mL | Freq: Once | INTRAVENOUS | Status: AC
  Administered 2021-03-07: 10 mL
  Filled 2021-03-07: qty 10

## 2021-03-07 MED ORDER — SODIUM CHLORIDE 0.9 % IV SOLN
Freq: Once | INTRAVENOUS | Status: AC
  Filled 2021-03-07: qty 250

## 2021-03-07 MED ORDER — SODIUM CHLORIDE 0.9% FLUSH
10.0000 mL | INTRAVENOUS | Status: DC | PRN
  Administered 2021-03-07: 10 mL
  Filled 2021-03-07: qty 10

## 2021-03-07 MED ORDER — SODIUM CHLORIDE 0.9 % IV SOLN
200.0000 mg | Freq: Once | INTRAVENOUS | Status: AC
  Administered 2021-03-07: 200 mg via INTRAVENOUS
  Filled 2021-03-07: qty 8

## 2021-03-07 MED ORDER — ONDANSETRON HCL 4 MG/2ML IJ SOLN
INTRAMUSCULAR | Status: AC
  Filled 2021-03-07: qty 4

## 2021-03-07 NOTE — Telephone Encounter (Signed)
Scheduled appointment per 06/03 sch msg. Patient is aware. 

## 2021-03-07 NOTE — Assessment & Plan Note (Signed)
His last CT imaging showed excellent response to therapy The mass on his neck is gone Per guidelines, We will continue pembrolizumab indefinitely

## 2021-03-07 NOTE — Assessment & Plan Note (Signed)
He tolerated oxycodone taper well I do not plan to reduce the dose any further until next month

## 2021-03-07 NOTE — Assessment & Plan Note (Signed)
He continues on metformin His blood sugar is improving dramatically He has lost some weight I continue to encourage him with dietary modification and lifestyle changes

## 2021-03-07 NOTE — Assessment & Plan Note (Signed)
He stated he is compliant taking Synthroid I will monitor TSH closely and adjust the dose of medicine as needed

## 2021-03-07 NOTE — Progress Notes (Signed)
[place] OFFICE PROGRESS NOTE  Patient Care Team: Patient, No Pcp Per (Inactive) as PCP - General (General Practice) [name], MD as Consulting Physician (Hematology and Oncology) Eppie Gibson, MD as Attending Physician (Radiation Oncology) Jodi Marble, MD as Consulting Physician (Otolaryngology) Philomena Doheny, MD as Referring Physician (Plastic Surgery) Irene Shipper, MD as Consulting Physician (Gastroenterology) System, Provider Not In  ASSESSMENT & PLAN:  Malignant neoplasm of head and neck [facility]) His last CT imaging showed excellent response to therapy The mass on his neck is gone Per guidelines, We will continue pembrolizumab indefinitely  Malignant neoplasm metastatic to lung [facility]) His last imaging study shows stability I do not plan to repeat imaging study on a routine basis  Acquired hypothyroidism He stated he is compliant taking Synthroid I will monitor TSH closely and adjust the dose of medicine as needed  Cancer associated pain He tolerated oxycodone taper well I do not plan to reduce the dose any further until next month  Type 2 diabetes mellitus ([place]) He continues on metformin His blood sugar is improving dramatically He has lost some weight I continue to encourage him with dietary modification and lifestyle changes   No orders of the defined types were placed in this encounter.   All questions were answered. The patient knows to call the clinic with any problems, questions or concerns. The total time spent in the appointment was 20 minutes encounter with patients including review of chart and various tests results, discussions about plan of care and coordination of care plan   [name], MD 03/07/2021 10:40 AM  INTERVAL HISTORY: Please see below for problem oriented charting. 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 Pan-endoscopy elsewhere showed cancer from nasopharyngeal space.   01/04/2007 - 02/20/2007 Chemotherapy   He received 2 cycles of cisplatin and 5FU followed by concurrent chemo with weekly cisplatin and radiation. He only received 2 doses of chemo due to severe mucositis, nausea and weight loss.   04/05/2007 - 08/04/2007 Chemotherapy   He received 4 more courses of cisplatin with 5FU and had complete response   07/05/2009 Procedure   Fine-needle aspirate of the right level II lymph nodes come from recurrent metastatic disease. Repeat endoscopy and CT scan show no evidence of disease elsewhere.   07/08/2009 - 12/02/2009 Chemotherapy   He was given 6 cycles of carboplatin, 5-FU and docetaxel   12/03/2009 Surgery   He has surgery to the residual lymph node on the right neck which showed no evidence of disease.   02/22/2012 Imaging   Repeat imaging study showed large recurrent mass. He was referred elsewhere for further treatment.   05/03/2012 Surgery   He underwent left upper lobectomy.   04/29/2013 Imaging   PEt scan showed lesion on right level II B and lower lung was  abnormal   06/03/2013 - 02/02/2014 Chemotherapy   He had 6 cycles of chemotherapy when he was found to have recurrence of cancer and had received oxaliplatin and capecitabine   06/07/2014 Imaging   PET CT scan showed persistent disease in the right neck lymph nodes and left lung   06/29/2014  Procedure   Accession: [id number] repeat LUL biopsy confirmed metastatic cancer   07/18/2014 - 07/31/2014 Radiation Therapy   He received palliative radiation therapy to the lungs   10/10/2014 Imaging   CT scan of the chest, abdomen and pelvis show regression in the size of the lung nodule in the left upper lobe and stable pulmonary nodules   01/24/2015 Imaging   CT scan showed stable disease in neck and lung   06/19/2015 Imaging   CT scan of the neck and the chest show possible mild progression of the nodule in the right side of the neck.   06/25/2015 Imaging   PET scan confirmed disease recurrence in the neck   07/07/2015 Imaging   He had [facility] neck [facility]   09/03/2015 - 08/26/2018 Chemotherapy   He received palliative chemo with Nivolumab   10/29/2015 Imaging   PET CT showed positive response to Rx   02/28/2016 Imaging   Ct abdomen showed abnormal thinkening in his stomach   03/03/2016 Imaging   CT: Right sternocleidomastoid muscle metastasis appears less distinct but otherwise not significantly changed in size or configuration since 06/19/2015.2. Left level 3 lymph node which was hypermetabolic by PET-CT in January 2017 appears slightly smaller   04/01/2016 Imaging   CT cervical spine showed no acute fracture or traumatic malalignment in the cervical spine   04/22/2016 Procedure   Port-a-cath placed.   06/16/2016 Imaging   Ct neck showed right sternocleidomastoid muscle metastasis is further decreased in conspicuity since May, and has mildly decreased in size since September 2016. Continued stability of sub-centimeter left cervical lymph nodes. No new or progressive metastatic disease in the neck.   06/16/2016 Imaging   CT chest showed stable masslike radiation fibrosis in the left upper lobe. Stable subcentimeter pulmonary nodules in the bilateral lower lobes. No new or progressive metastatic disease in the chest. Nonobstructing left renal stone.   10/13/2016 Imaging   Ct neck  showed unchanged right sternocleidomastoid muscle metastasis. Unchanged subcentimeter left cervical lymph nodes. No evidence of new or progressive metastatic disease in the neck.   10/13/2016 Imaging   CT chest showed tiny hypervascular foci in the liver, not definitely seen on prior imaging of 06/16/2016 and 02/28/2016. Abdomen MRI without and with contrast recommended to further evaluate as metastatic disease is a concern. 2. Stable appearance of post treatment changes left upper lung and scattered tiny bilateral pulmonary nodules.   02/11/2017 Imaging   Ct neck: Lymph node mass right posterior neck appears improved from the prior study. Small posterior lymph nodes on the left unchanged. Occluded right jugular vein unchanged.   02/11/2017 Imaging   1. Similar appearance of postsurgical and radiation changes in the left upper lobe. 2. Similar bilateral pulmonary nodules. 3. No thoracic adenopathy. 4. Subtle foci of post-contrast enhancement within the liver are suboptimally characterized on this nondedicated study. Likely similar. These could either be re-evaluated at followup or more entirely characterized with abdominal MRI. 5. Left nephrolithiasis.   05/19/2017 Imaging   Matted lymph node mass right posterior neck appears larger in the recent CT. Accurate measurements difficult due to infiltrating tumor margins and infiltration of the muscle. Right jugular vein  again appears occluded or resected. Small left posterior lymph nodes stable. Left upper lobe airspace density stable and similar to the prior CT   06/03/2017 PET scan   1. Hypermetabolic ill-defined right level IIb lymph node, about 1.3 cm in diameter with maximum SUV 9.5 (formerly 8.1). Appearance suspicious for residual/recurrent malignancy. No worrisome left-sided lesion. 2. Left suprahilar indistinct opacity demonstrates no worrisome hypermetabolic activity. The 5 mm left lower lobe pulmonary nodule is stable and not currently  hypermetabolic although below sensitive PET-CT size thresholds. 3. Other imaging findings of potential clinical significance: Bilateral nonobstructive nephrolithiasis. Chronic bilateral maxillary sinusitis.   05/11/2018 PET scan   1. Continued chronic accentuated metabolic activity in the vicinity of right level IIB and the adjacent right sternocleidomastoid muscle, with ill definition of surrounding tissue planes. Maximum SUV is currently 8.1, formerly 9.5. Accentuated metabolic activity is been present in this vicinity back through 06/25/2015, and there was also some low-level activity in this vicinity on 06/07/2014. Some of this may be from scarring and local muscular activity although clearly a component of residual tumor is difficult to exclude given the focally high activity. 2. Other imaging findings of potential clinical significance: Chronic bilateral maxillary sinusitis. Chronic scarring in the left upper lobe. Chronically stable 5 mm left lower lobe nodule is considered benign. Nonobstructive left nephrolithiasis.   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 COMMENT:No significant keratinization is identified. Other basaloid carcinomas are in the differential diagnosis. No cell block material is available for further testing.   09/12/2018 Procedure   He underwent fine Needle Aspiration   10/04/2018 PET scan   1. Significant progression of local recurrence laterally in the mid right neck with an enlarging, increasingly hypermetabolic soft tissue mass. This involves the right sternocleidomastoid muscle. 2. Small lymph nodes in the right axilla are increasingly hypermetabolic. These are nonspecific and potentially reactive, although could reflect a small metastases. Small hypermetabolic nodule in the left suprasternal notch is unchanged. 3. No  other evidence of metastatic disease.    10/07/2018 - 12/23/2018 Chemotherapy   The patient had cisplatin plus gemzar   12/07/2018 Imaging   1. Decreased size of lateral right neck mass. 2. Unchanged soft tissue nodule in the suprasternal notch. 3. No evidence of new metastatic disease in the neck.    05/30/2019 Imaging   CT neck No clear change or progression compared to the study of March. Overall measurements of the right lateral neck mass are similar, approximately 3 x 1.8 cm. See above discussion. One could argue that there is slight increase in lateral bulging, possibly with an increase in contrast enhancement, towards the inferior margin. This is of questionable validity but could possibly represent some progression or inflammatory change. Other findings in the region are stable.   07/20/2019 - 09/15/2019 Chemotherapy   The patient had dexamethasone (DECADRON) 4 MG tablet, 1 of 1 cycle, Start date: --, End date: -- palonosetron (ALOXI) injection 0.25 mg, 0.25 mg, Intravenous,  Once, 3 of 4 cycles Administration: 0.25 mg (07/20/2019), 0.25 mg (08/10/2019), 0.25 mg (09/08/2019) CISplatin (PLATINOL) 84 mg in sodium chloride 0.9 % 250 mL chemo infusion, 40 mg/m2 = 84 mg (80 % of original dose 50 mg/m2), Intravenous,  Once, 3 of 4 cycles Dose modification: 40 mg/m2 (80 % of original dose 50 mg/m2, Cycle 1, Reason: Dose Not Tolerated) Administration: 84 mg (07/20/2019), 84 mg (08/10/2019), 83 mg (  09/08/2019) gemcitabine (GEMZAR) 1,600 mg in sodium chloride 0.9 % 250 mL chemo infusion, 1,672 mg (80 % of original dose 1,000 mg/m2), Intravenous,  Once, 3 of 4 cycles Dose modification: 800 mg/m2 (80 % of original dose 1,000 mg/m2, Cycle 1, Reason: Provider Judgment) Administration: 1,600 mg (07/20/2019), 1,600 mg (07/27/2019), 1,600 mg (08/10/2019), 1,600 mg (08/17/2019), 1,600 mg (09/08/2019), 1,672 mg (09/15/2019) ondansetron (ZOFRAN) 8 mg, dexamethasone (DECADRON) 10 mg in sodium chloride 0.9 % 50 mL  IVPB, , Intravenous,  Once, 3 of 4 cycles Administration:  (09/15/2019) fosaprepitant (EMEND) 150 mg, dexamethasone (DECADRON) 12 mg in sodium chloride 0.9 % 145 mL IVPB, , Intravenous,  Once, 3 of 4 cycles Administration:  (07/20/2019),  (08/10/2019),  (09/08/2019)  for chemotherapy treatment.    10/02/2019 Imaging   CT neck As compared to 05/30/2019, no significant interval change in size of an ill-defined mass within the right lateral neck, again measuring 3.3 x 1.8 cm in transaxial dimensions.   Unchanged mildly enlarged left level I lymph node measuring 1.1 cm in short axis.   Unchanged node or nodule at the thoracic inlet, measuring 1.3 x 0.8 cm.   Please refer to concurrently performed chest CT for a description of findings below the level of the thoracic inlet.     10/02/2019 Imaging   CT chest 1. No new or progressive findings in the chest to suggest metastatic disease. 2. Bilateral subcentimeter solid pulmonary nodules are stable since 2018. 3. Hyperdense 1.1 cm anterior liver focus, not clearly visualized on prior studies. Suggest MRI abdomen without and with IV contrast for further characterization.   10/20/2019 - 12/29/2019 Chemotherapy   The patient had ondansetron (ZOFRAN) injection 8 mg, 8 mg (100 % of original dose 8 mg), Intravenous,  Once, 2 of 5 cycles Dose modification: 8 mg (original dose 8 mg, Cycle 2) Administration: 8 mg (11/17/2019), 8 mg (12/15/2019), 8 mg (12/29/2019) gemcitabine (GEMZAR) 2,000 mg in sodium chloride 0.9 % 250 mL chemo infusion, 2,090 mg, Intravenous,  Once, 3 of 6 cycles Administration: 2,000 mg (10/20/2019), 2,000 mg (11/03/2019), 2,000 mg (11/17/2019), 2,000 mg (12/15/2019), 2,000 mg (12/29/2019)  for chemotherapy treatment.    06/12/2020 Imaging   1. Enlarging superficial, exophytic component of the chronic right sternocleidomastoid muscle mass. See series 6, image 55. 2. Elsewhere stable CT appearance of the Neck.   06/12/2020 Imaging   Post  treatment scarring in the left hemithorax, stable. No evidence recurrent or metastatic disease   06/14/2020 - 10/15/2020 Chemotherapy   He received carboplatin, 5FU and Beryle Flock       10/31/2020 Procedure   Interval improvement in right lateral lymph node mass. Improvement in dermal component as well as invasion of the right sternocleidomastoid muscle.   10 mm submental lymph node slightly enlarged compared to the prior study. Continued follow-up recommended.   11/01/2020 -  Chemotherapy    Patient is on Treatment Plan: HEAD/NECK PEMBROLIZUMAB Q21D        REVIEW OF SYSTEMS:   Constitutional: Denies fevers, chills or abnormal weight loss Eyes: Denies blurriness of vision Ears, nose, mouth, throat, and face: Denies mucositis or sore throat Respiratory: Denies cough, dyspnea or wheezes Cardiovascular: Denies palpitation, chest discomfort or lower extremity swelling Gastrointestinal:  Denies nausea, heartburn or change in bowel habits Skin: Denies abnormal skin rashes Lymphatics: Denies new lymphadenopathy or easy bruising Neurological:Denies numbness, tingling or new weaknesses Behavioral/Psych: Mood is stable, no new changes  All other systems were reviewed with the patient and are negative.  I  have reviewed the past medical history, past surgical history, social history and family history with the patient and they are unchanged from previous note.  ALLERGIES:  is allergic to phenergan [promethazine hcl], heparin, and clindamycin.  MEDICATIONS:  Current Outpatient Medications  Medication Sig Dispense Refill  . Accu-Chek Softclix Lancets lancets CHECK BLOOD GLUCOSE ONCE A DAY IN THE MORNING. 100 each 12  . atorvastatin (LIPITOR) 40 MG tablet TK 1 T PO  D    . blood glucose meter kit and supplies KIT Dispense based on patient and insurance preference. Use up to four times daily as directed. (FOR ICD-9 250.00, 250.01). 1 each 1  . Blood Glucose Monitoring Suppl (ACCU-CHEK GUIDE)  w/Device KIT USE ONCE DAILY AS DIRECTED IN THE MORNING 1 kit 0  . cetirizine (ZYRTEC) 10 MG tablet TAKE 1 TABLET(10 MG) BY MOUTH DAILY 30 tablet 1  . diclofenac Sodium (VOLTAREN) 1 % GEL Apply topically.    . gabapentin (NEURONTIN) 300 MG capsule Take 1 capsule (300 mg total) by mouth 2 (two) times daily. 60 capsule 11  . glucose blood test strip CHECK BLOOD GLUCOSE ONCE A DAY IN THE MORNING 100 strip 12  . levothyroxine (SYNTHROID) 137 MCG tablet TAKE 1 TABLET BY MOUTH DAILY BEFORE BREAKFAST 90 tablet 9  . LORazepam (ATIVAN) 1 MG tablet Take 1 tablet (1 mg total) by mouth every 8 (eight) hours as needed for anxiety. 30 tablet 0  . metFORMIN (GLUCOPHAGE) 500 MG tablet TAKE 1 TABLET BY MOUTH 2 TIMES DAILY WITH A MEAL 60 tablet 2  . omeprazole (PRILOSEC) 40 MG capsule Take 1 capsule (40 mg total) by mouth daily. 30 capsule 9  . ondansetron (ZOFRAN) 8 MG tablet Take 1 tablet (8 mg total) by mouth every 8 (eight) hours as needed for nausea or vomiting. 90 tablet 1  . oxyCODONE (OXY IR/ROXICODONE) 5 MG immediate release tablet Take 1 tablet (5 mg total) by mouth every 6 (six) hours as needed for severe pain. 90 tablet 0  . PARoxetine (PAXIL) 20 MG tablet Take 1 tablet (20 mg total) by mouth daily. 30 tablet 5  . polyethylene glycol (MIRALAX) packet Take 17 g by mouth daily. (Patient taking differently: Take 17 g by mouth daily as needed for mild constipation or moderate constipation. ) 14 each 3  . senna-docusate (SENOKOT-S) 8.6-50 MG tablet Take 2 tablets by mouth 3 (three) times daily. 90 tablet 0  . Vitamin D, Ergocalciferol, (DRISDOL) 1.25 MG (50000 UT) CAPS capsule TK 1 C PO WEEKLY     No current facility-administered medications for this visit.   Facility-Administered Medications Ordered in Other Visits  Medication Dose Route Frequency Provider Last Rate Last Admin  . anticoagulant sodium citrate solution 5 mL  5 mL Intracatheter Once Alvy Bimler, Kalijah Zeiss, MD      . anticoagulant sodium citrate solution  5 mL  5 mL Intracatheter Once Alvy Bimler, Garrell Flagg, MD      . anticoagulant sodium citrate solution 5 mL  5 mL Intracatheter Once Alvy Bimler, Madellyn Denio, MD      . pembrolizumab (KEYTRUDA) 200 mg in sodium chloride 0.9 % 50 mL chemo infusion  200 mg Intravenous Once Tamyka Bezio, MD      . sodium chloride flush (NS) 0.9 % injection 10 mL  10 mL Intracatheter PRN Alvy Bimler, Hiren Peplinski, MD        PHYSICAL EXAMINATION: ECOG PERFORMANCE STATUS: 1 - Symptomatic but completely ambulatory  Vitals:   03/07/21 0937  BP: 130/76  Pulse: 71  Resp:  18  Temp: 97.6 F (36.4 C)  SpO2: 100%   Filed Weights   03/07/21 0937  Weight: 187 lb 12.8 oz (85.2 kg)    GENERAL:alert, no distress and comfortable SKIN: skin color, texture, turgor are normal, no rashes or significant lesions EYES: normal, Conjunctiva are pink and non-injected, sclera clear OROPHARYNX:no exudate, no erythema and lips, buccal mucosa, and tongue normal  NECK: Noted neck fibrosis.  The mass on the neck is gone LYMPH:  no palpable lymphadenopathy in the cervical, axillary or inguinal LUNGS: clear to auscultation and percussion with normal breathing effort HEART: regular rate & rhythm and no murmurs and no lower extremity edema ABDOMEN:abdomen soft, non-tender and normal bowel sounds Musculoskeletal:no cyanosis of digits and no clubbing  NEURO: alert & oriented x 3 with fluent speech, no focal motor/sensory deficits  LABORATORY DATA:  I have reviewed the data as listed    Component Value Date/Time   NA 141 03/07/2021 0916   NA 139 09/22/2017 0829   K 3.4 (L) 03/07/2021 0916   K 3.5 09/22/2017 0829   CL 104 03/07/2021 0916   CO2 25 03/07/2021 0916   CO2 26 09/22/2017 0829   GLUCOSE 112 (H) 03/07/2021 0916   GLUCOSE 133 09/22/2017 0829   BUN 12 03/07/2021 0916   BUN 14.1 09/22/2017 0829   CREATININE 0.92 03/07/2021 0916   CREATININE 0.9 09/22/2017 0829   CALCIUM 9.4 03/07/2021 0916   CALCIUM 9.1 09/22/2017 0829   PROT 6.6 03/07/2021 0916   PROT  6.8 09/22/2017 0829   ALBUMIN 4.0 03/07/2021 0916   ALBUMIN 4.1 09/22/2017 0829   AST 20 03/07/2021 0916   AST 22 09/22/2017 0829   ALT 17 03/07/2021 0916   ALT 30 09/22/2017 0829   ALKPHOS 59 03/07/2021 0916   ALKPHOS 55 09/22/2017 0829   BILITOT 0.3 03/07/2021 0916   BILITOT 0.35 09/22/2017 0829   GFRNONAA >60 03/07/2021 0916   GFRAA >60 07/05/2020 0845   GFRAA >60 02/06/2019 1215    No results found for: SPEP, UPEP  Lab Results  Component Value Date   WBC 5.4 03/07/2021   NEUTROABS 2.6 03/07/2021   HGB 13.5 03/07/2021   HCT 39.7 03/07/2021   MCV 85.9 03/07/2021   PLT 231 03/07/2021      Chemistry      Component Value Date/Time   NA 141 03/07/2021 0916   NA 139 09/22/2017 0829   K 3.4 (L) 03/07/2021 0916   K 3.5 09/22/2017 0829   CL 104 03/07/2021 0916   CO2 25 03/07/2021 0916   CO2 26 09/22/2017 0829   BUN 12 03/07/2021 0916   BUN 14.1 09/22/2017 0829   CREATININE 0.92 03/07/2021 0916   CREATININE 0.9 09/22/2017 0829      Component Value Date/Time   CALCIUM 9.4 03/07/2021 0916   CALCIUM 9.1 09/22/2017 0829   ALKPHOS 59 03/07/2021 0916   ALKPHOS 55 09/22/2017 0829   AST 20 03/07/2021 0916   AST 22 09/22/2017 0829   ALT 17 03/07/2021 0916   ALT 30 09/22/2017 0829   BILITOT 0.3 03/07/2021 0916   BILITOT 0.35 09/22/2017 0829

## 2021-03-07 NOTE — Patient Instructions (Signed)
[facility]   Discharge Instructions: Thank you for choosing [facility] to provide your oncology and hematology care.   If you have a lab appointment with the [facility], please go directly to the [facility] and check in at the registration area.   Wear comfortable clothing and clothing appropriate for easy access to any Portacath or PICC line.   We strive to give you quality time with your provider. You may need to reschedule your appointment if you arrive late (15 or more minutes).  Arriving late affects you and other patients whose appointments are after yours.  Also, if you miss three or more appointments without notifying the office, you may be dismissed from the clinic at the provider's discretion.      For prescription refill requests, have your pharmacy contact our office and allow 72 hours for refills to be completed.    Today you received the following chemotherapy and/or immunotherapy agents: Pembrolizumab Beryle Flock)      To help prevent nausea and vomiting after your treatment, we encourage you to take your nausea medication as directed.  BELOW ARE SYMPTOMS THAT SHOULD BE REPORTED IMMEDIATELY: . *FEVER GREATER THAN 100.4 F (38 C) OR HIGHER . *CHILLS OR SWEATING . *NAUSEA AND VOMITING THAT IS NOT CONTROLLED WITH YOUR NAUSEA MEDICATION . *UNUSUAL SHORTNESS OF BREATH . *UNUSUAL BRUISING OR BLEEDING . *URINARY PROBLEMS (pain or burning when urinating, or frequent urination) . *BOWEL PROBLEMS (unusual diarrhea, constipation, pain near the anus) . TENDERNESS IN MOUTH AND THROAT WITH OR WITHOUT PRESENCE OF ULCERS (sore throat, sores in mouth, or a toothache) . UNUSUAL RASH, SWELLING OR PAIN  . UNUSUAL VAGINAL DISCHARGE OR ITCHING   Items with * indicate a potential emergency and should be followed up as soon as possible or go to the Emergency Department if any problems should occur.  Please show the CHEMOTHERAPY ALERT CARD or  IMMUNOTHERAPY ALERT CARD at check-in to the Emergency Department and triage nurse.  Should you have questions after your visit or need to cancel or reschedule your appointment, please contact [facility]  Dept: [phone]  and follow the prompts.  Office hours are 8:00 a.m. to 4:30 p.m. Monday - Friday. Please note that voicemails left after 4:00 p.m. may not be returned until the following business day.  We are closed weekends and major holidays. You have access to a nurse at all times for urgent questions. Please call the main number to the clinic Dept: [phone] and follow the prompts.   For any non-urgent questions, you may also contact your provider using MyChart. We now offer e-Visits for anyone 70 and older to request care online for non-urgent symptoms. For details visit [email or web address].   Also download the MyChart app! Go to the app store, search "MyChart", open the app, select [facility], and log in with your MyChart username and password.  Due to Covid, a mask is required upon entering the hospital/clinic. If you do not have a mask, one will be given to you upon arrival. For doctor visits, patients may have 1 support person aged 103 or older with them. For treatment visits, patients cannot have anyone with them due to current Covid guidelines and our immunocompromised population.

## 2021-03-07 NOTE — Assessment & Plan Note (Signed)
His last imaging study shows stability I do not plan to repeat imaging study on a routine basis

## 2021-03-07 NOTE — Patient Instructions (Signed)

## 2021-03-08 LAB — T4: T4, Total: 5.7 ug/dL (ref 4.5–12.0)

## 2021-03-28 ENCOUNTER — Inpatient Hospital Stay: Payer: Medicaid Other

## 2021-03-28 ENCOUNTER — Encounter: Payer: Self-pay | Admitting: Hematology and Oncology

## 2021-03-28 ENCOUNTER — Other Ambulatory Visit: Payer: Self-pay | Admitting: Hematology and Oncology

## 2021-03-28 ENCOUNTER — Inpatient Hospital Stay (HOSPITAL_BASED_OUTPATIENT_CLINIC_OR_DEPARTMENT_OTHER): Payer: Medicaid Other | Admitting: Hematology and Oncology

## 2021-03-28 ENCOUNTER — Other Ambulatory Visit (HOSPITAL_COMMUNITY): Payer: Self-pay

## 2021-03-28 ENCOUNTER — Other Ambulatory Visit: Payer: Self-pay

## 2021-03-28 DIAGNOSIS — Z7189 Other specified counseling: Secondary | ICD-10-CM

## 2021-03-28 DIAGNOSIS — E039 Hypothyroidism, unspecified: Secondary | ICD-10-CM

## 2021-03-28 DIAGNOSIS — C760 Malignant neoplasm of head, face and neck: Secondary | ICD-10-CM | POA: Diagnosis not present

## 2021-03-28 DIAGNOSIS — Z95828 Presence of other vascular implants and grafts: Secondary | ICD-10-CM

## 2021-03-28 DIAGNOSIS — E119 Type 2 diabetes mellitus without complications: Secondary | ICD-10-CM | POA: Diagnosis not present

## 2021-03-28 DIAGNOSIS — G893 Neoplasm related pain (acute) (chronic): Secondary | ICD-10-CM

## 2021-03-28 DIAGNOSIS — C7800 Secondary malignant neoplasm of unspecified lung: Secondary | ICD-10-CM

## 2021-03-28 DIAGNOSIS — Z5112 Encounter for antineoplastic immunotherapy: Secondary | ICD-10-CM | POA: Diagnosis not present

## 2021-03-28 LAB — CMP (CANCER CENTER ONLY)
ALT: 23 U/L (ref 0–44)
AST: 25 U/L (ref 15–41)
Albumin: 4 g/dL (ref 3.5–5.0)
Alkaline Phosphatase: 65 U/L (ref 38–126)
Anion gap: 13 (ref 5–15)
BUN: 12 mg/dL (ref 6–20)
CO2: 24 mmol/L (ref 22–32)
Calcium: 9.3 mg/dL (ref 8.9–10.3)
Chloride: 104 mmol/L (ref 98–111)
Creatinine: 0.84 mg/dL (ref 0.61–1.24)
GFR, Estimated: >60 mL/min (ref >60)
Glucose, Bld: 129 mg/dL — ABNORMAL HIGH (ref 70–99)
Potassium: 3.7 mmol/L (ref 3.5–5.1)
Sodium: 141 mmol/L (ref 135–145)
Total Bilirubin: 0.4 mg/dL (ref 0.3–1.2)
Total Protein: 7.1 g/dL (ref 6.5–8.1)

## 2021-03-28 LAB — CBC WITH DIFFERENTIAL (CANCER CENTER ONLY)
Abs Immature Granulocytes: 0.01 10*3/uL (ref 0.00–0.07)
Basophils Absolute: 0 10*3/uL (ref 0.0–0.1)
Basophils Relative: 1 %
Eosinophils Absolute: 0.2 10*3/uL (ref 0.0–0.5)
Eosinophils Relative: 4 %
HCT: 42.6 % (ref 39.0–52.0)
Hemoglobin: 14.3 g/dL (ref 13.0–17.0)
Immature Granulocytes: 0 %
Lymphocytes Relative: 25 %
Lymphs Abs: 1.6 10*3/uL (ref 0.7–4.0)
MCH: 29 pg (ref 26.0–34.0)
MCHC: 33.6 g/dL (ref 30.0–36.0)
MCV: 86.4 fL (ref 80.0–100.0)
Monocytes Absolute: 0.6 10*3/uL (ref 0.1–1.0)
Monocytes Relative: 10 %
Neutro Abs: 3.9 10*3/uL (ref 1.7–7.7)
Neutrophils Relative %: 60 %
Platelet Count: 209 10*3/uL (ref 150–400)
RBC: 4.93 MIL/uL (ref 4.22–5.81)
RDW: 15 % (ref 11.5–15.5)
WBC Count: 6.4 10*3/uL (ref 4.0–10.5)
nRBC: 0 % (ref 0.0–0.2)

## 2021-03-28 LAB — TSH: TSH: 0.588 u[IU]/mL (ref 0.320–4.118)

## 2021-03-28 MED ORDER — SODIUM CHLORIDE 0.9 % IV SOLN: 8.0000 mg | Freq: Once | INTRAVENOUS | Status: DC

## 2021-03-28 MED ORDER — SODIUM CHLORIDE 0.9 % IV SOLN
Freq: Once | INTRAVENOUS | Status: DC
  Filled 2021-03-28: qty 250

## 2021-03-28 MED ORDER — ANTICOAGULANT SODIUM CITRATE 4% (200MG/5ML) IV SOLN
5.0000 mL | Freq: Once | Status: DC
  Filled 2021-03-28: qty 5

## 2021-03-28 MED ORDER — ONDANSETRON HCL 4 MG/2ML IJ SOLN
INTRAMUSCULAR | Status: AC
  Filled 2021-03-28: qty 4

## 2021-03-28 MED ORDER — ONDANSETRON HCL 8 MG PO TABS
8.0000 mg | ORAL_TABLET | Freq: Three times a day (TID) | ORAL | 1 refills | Status: DC | PRN
  Filled 2021-03-28: qty 90, 30d supply, fill #0

## 2021-03-28 MED ORDER — ONDANSETRON HCL 4 MG/2ML IJ SOLN
8.0000 mg | Freq: Once | INTRAMUSCULAR | Status: AC
  Administered 2021-03-28: 8 mg via INTRAVENOUS

## 2021-03-28 MED ORDER — SODIUM CHLORIDE 0.9 % IV SOLN
200.0000 mg | Freq: Once | INTRAVENOUS | Status: AC
  Administered 2021-03-28: 200 mg via INTRAVENOUS
  Filled 2021-03-28: qty 8

## 2021-03-28 MED ORDER — OXYCODONE HCL 5 MG PO TABS
5.0000 mg | ORAL_TABLET | Freq: Four times a day (QID) | ORAL | 0 refills | Status: DC | PRN
  Filled 2021-03-28: qty 90, 23d supply, fill #0

## 2021-03-28 MED ORDER — SODIUM CHLORIDE 0.9% FLUSH
10.0000 mL | Freq: Once | INTRAVENOUS | Status: AC
  Administered 2021-03-28: 10 mL
  Filled 2021-03-28: qty 10

## 2021-03-28 NOTE — Assessment & Plan Note (Signed)
He stated he is compliant taking Synthroid I will monitor TSH closely and adjust the dose of medicine as needed

## 2021-03-28 NOTE — Assessment & Plan Note (Signed)
His last imaging study shows stability I do not plan to repeat imaging study on a routine basis So far, he tolerated treatment very well

## 2021-03-28 NOTE — Assessment & Plan Note (Signed)
He is successful in tapering down to oxycodone dose We will wean him off oxycodone very slowly For now, he will continue at current prescribed dose to use as needed

## 2021-03-28 NOTE — Progress Notes (Signed)
[facility] OFFICE PROGRESS NOTE  Patient Care Team: Patient, No Pcp Per (Inactive) as PCP - General (General Practice) [name], MD as Consulting Physician (Hematology and Oncology) Eppie Gibson, MD as Attending Physician (Radiation Oncology) Jodi Marble, MD as Consulting Physician (Otolaryngology) Philomena Doheny, MD as Referring Physician (Plastic Surgery) Irene Shipper, MD as Consulting Physician (Gastroenterology) System, Provider Not In  ASSESSMENT & PLAN:  Malignant neoplasm of head and neck [facility]) His last imaging study shows stability I do not plan to repeat imaging study on a routine basis So far, he tolerated treatment very well  Acquired hypothyroidism He stated he is compliant taking Synthroid I will monitor TSH closely and adjust the dose of medicine as needed  Cancer associated pain He is successful in tapering down to oxycodone dose We will wean him off oxycodone very slowly For now, he will continue at current prescribed dose to use as needed  Type 2 diabetes mellitus ([place]) He continues on metformin His blood sugar is improving dramatically He has lost some weight I continue to encourage him with dietary modification and lifestyle changes  No orders of the defined types were placed in this encounter.   All questions were answered. The patient knows to call the clinic with any problems, questions or concerns. The total time spent in the appointment was 20 minutes encounter with patients including review of chart and various tests results, discussions about plan of care and coordination of care plan   [name], MD 03/28/2021 2:10 PM  INTERVAL HISTORY: Please see below for problem oriented charting. 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 Pan-endoscopy elsewhere showed cancer from nasopharyngeal space.    01/04/2007 - 02/20/2007 Chemotherapy   He received 2 cycles of cisplatin and 5FU followed by concurrent chemo with weekly cisplatin and radiation. He only received 2 doses of chemo due to severe mucositis, nausea and weight loss.    04/05/2007 - 08/04/2007 Chemotherapy   He received 4 more courses of cisplatin with 5FU and had complete response    07/05/2009 Procedure   Fine-needle aspirate of the right level II lymph nodes come from recurrent metastatic disease. Repeat endoscopy and CT scan show no evidence of disease elsewhere.    07/08/2009 - 12/02/2009 Chemotherapy   He was given 6 cycles of carboplatin, 5-FU and docetaxel    12/03/2009 Surgery   He has surgery to the residual lymph node on the right neck which showed no evidence of disease.    02/22/2012 Imaging   Repeat imaging study showed large recurrent mass. He was referred elsewhere for further treatment.    05/03/2012 Surgery   He underwent left upper lobectomy.    04/29/2013 Imaging   PEt scan showed lesion on right level II B and lower lung was abnormal  06/03/2013 - 02/02/2014 Chemotherapy   He had 6 cycles of chemotherapy when he was found to have recurrence of cancer and had received oxaliplatin and capecitabine    06/07/2014 Imaging   PET CT scan showed persistent disease in the right neck lymph nodes and left lung    06/29/2014 Procedure   Accession: [id number] repeat LUL biopsy  confirmed metastatic cancer    07/18/2014 - 07/31/2014 Radiation Therapy   He received palliative radiation therapy to the lungs    10/10/2014 Imaging   CT scan of the chest, abdomen and pelvis show regression in the size of the lung nodule in the left upper lobe and stable pulmonary nodules    01/24/2015 Imaging   CT scan showed stable disease in neck and lung    06/19/2015 Imaging   CT scan of the neck and the chest show possible mild progression of the nodule in the right side of the neck.    06/25/2015 Imaging   PET scan confirmed disease recurrence in the neck    07/07/2015 Imaging   He had MRI neck at [facility]    09/03/2015 - 08/26/2018 Chemotherapy   He received palliative chemo with Nivolumab    10/29/2015 Imaging   PET CT showed positive response to Rx    02/28/2016 Imaging   Ct abdomen showed abnormal thinkening in his stomach    03/03/2016 Imaging   CT: Right sternocleidomastoid muscle metastasis appears less distinct but otherwise not significantly changed in size or configuration since 06/19/2015.2. Left level 3 lymph node which was hypermetabolic by PET-CT in January 2017 appears slightly smaller    04/01/2016 Imaging   CT cervical spine showed no acute fracture or traumatic malalignment in the cervical spine    04/22/2016 Procedure   Port-a-cath placed.    06/16/2016 Imaging   Ct neck showed right sternocleidomastoid muscle metastasis is further decreased in conspicuity since May, and has mildly decreased in size since September 2016. Continued stability of sub-centimeter left cervical lymph nodes. No new or progressive metastatic disease in the neck.    06/16/2016 Imaging   CT chest showed stable masslike radiation fibrosis in the left upper lobe. Stable subcentimeter pulmonary nodules in the bilateral lower lobes. No new or progressive metastatic disease in the chest. Nonobstructing left renal stone.    10/13/2016 Imaging   Ct neck showed unchanged right  sternocleidomastoid muscle metastasis. Unchanged subcentimeter left cervical lymph nodes. No evidence of new or progressive metastatic disease in the neck.    10/13/2016 Imaging   CT chest showed tiny hypervascular foci in the liver, not definitely seen on prior imaging of 06/16/2016 and 02/28/2016. Abdomen MRI without and with contrast recommended to further evaluate as metastatic disease is a concern. 2. Stable appearance of post treatment changes left upper lung and scattered tiny bilateral pulmonary nodules.    02/11/2017 Imaging   Ct neck: Lymph node mass right posterior neck appears improved from the prior study. Small posterior lymph nodes on the left unchanged. Occluded right jugular vein unchanged.    02/11/2017 Imaging   1. Similar appearance of postsurgical and radiation changes in the left upper lobe. 2. Similar bilateral pulmonary nodules. 3. No thoracic adenopathy. 4. Subtle foci of post-contrast enhancement within the liver are suboptimally characterized on this nondedicated study. Likely similar. These could either be re-evaluated at followup or more entirely characterized with abdominal MRI. 5. Left nephrolithiasis.    05/19/2017 Imaging   Matted lymph node mass right posterior neck appears larger in the  recent CT. Accurate measurements difficult due to infiltrating tumor margins and infiltration of the muscle. Right jugular vein again appears occluded or resected. Small left posterior lymph nodes stable. Left upper lobe airspace density stable and similar to the prior CT    06/03/2017 PET scan   1. Hypermetabolic ill-defined right level IIb lymph node, about 1.3 cm in diameter with maximum SUV 9.5 (formerly 8.1). Appearance suspicious for residual/recurrent malignancy. No worrisome left-sided lesion. 2. Left suprahilar indistinct opacity demonstrates no worrisome hypermetabolic activity. The 5 mm left lower lobe pulmonary nodule is stable and not currently hypermetabolic  although below sensitive PET-CT size thresholds. 3. Other imaging findings of potential clinical significance: Bilateral nonobstructive nephrolithiasis. Chronic bilateral maxillary sinusitis.    05/11/2018 PET scan   1. Continued chronic accentuated metabolic activity in the vicinity of right level IIB and the adjacent right sternocleidomastoid muscle, with ill definition of surrounding tissue planes. Maximum SUV is currently 8.1, formerly 9.5. Accentuated metabolic activity is been present in this vicinity back through 06/25/2015, and there was also some low-level activity in this vicinity on 06/07/2014. Some of this may be from scarring and local muscular activity although clearly a component of residual tumor is difficult to exclude given the focally high activity. 2. Other imaging findings of potential clinical significance: Chronic bilateral maxillary sinusitis. Chronic scarring in the left upper lobe. Chronically stable 5 mm left lower lobe nodule is considered benign. Nonobstructive left nephrolithiasis.    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 COMMENT:No significant keratinization is identified. Other basaloid carcinomas are in the differential diagnosis. No cell block material is available for further testing.    09/12/2018 Procedure   He underwent fine Needle Aspiration    10/04/2018 PET scan   1. Significant progression of local recurrence laterally in the mid right neck with an enlarging, increasingly hypermetabolic soft tissue mass. This involves the right sternocleidomastoid muscle. 2. Small lymph nodes in the right axilla are increasingly hypermetabolic. These are nonspecific and potentially reactive, although could reflect a small metastases. Small hypermetabolic nodule in the left suprasternal notch is unchanged. 3. No other  evidence of metastatic disease.      10/07/2018 - 12/23/2018 Chemotherapy   The patient had cisplatin plus gemzar    12/07/2018 Imaging   1. Decreased size of lateral right neck mass. 2. Unchanged soft tissue nodule in the suprasternal notch. 3. No evidence of new metastatic disease in the neck.      05/30/2019 Imaging   CT neck No clear change or progression compared to the study of March. Overall measurements of the right lateral neck mass are similar, approximately 3 x 1.8 cm. See above discussion. One could argue that there is slight increase in lateral bulging, possibly with an increase in contrast enhancement, towards the inferior margin. This is of questionable validity but could possibly represent some progression or inflammatory change. Other findings in the region are stable.   07/20/2019 - 09/15/2019 Chemotherapy   The patient had dexamethasone (DECADRON) 4 MG tablet, 1 of 1 cycle, Start date: --, End date: -- palonosetron (ALOXI) injection 0.25 mg, 0.25 mg, Intravenous,  Once, 3 of 4 cycles Administration: 0.25 mg (07/20/2019), 0.25 mg (08/10/2019), 0.25 mg (09/08/2019) CISplatin (PLATINOL) 84 mg in sodium chloride 0.9 % 250 mL chemo infusion, 40 mg/m2 = 84 mg (80 % of original dose 50 mg/m2),  Intravenous,  Once, 3 of 4 cycles Dose modification: 40 mg/m2 (80 % of original dose 50 mg/m2, Cycle 1, Reason: Dose Not Tolerated) Administration: 84 mg (07/20/2019), 84 mg (08/10/2019), 83 mg (09/08/2019) gemcitabine (GEMZAR) 1,600 mg in sodium chloride 0.9 % 250 mL chemo infusion, 1,672 mg (80 % of original dose 1,000 mg/m2), Intravenous,  Once, 3 of 4 cycles Dose modification: 800 mg/m2 (80 % of original dose 1,000 mg/m2, Cycle 1, Reason: Provider Judgment) Administration: 1,600 mg (07/20/2019), 1,600 mg (07/27/2019), 1,600 mg (08/10/2019), 1,600 mg (08/17/2019), 1,600 mg (09/08/2019), 1,672 mg (09/15/2019) ondansetron (ZOFRAN) 8 mg, dexamethasone (DECADRON) 10 mg in sodium chloride 0.9 % 50 mL  IVPB, , Intravenous,  Once, 3 of 4 cycles Administration:  (09/15/2019) fosaprepitant (EMEND) 150 mg, dexamethasone (DECADRON) 12 mg in sodium chloride 0.9 % 145 mL IVPB, , Intravenous,  Once, 3 of 4 cycles Administration:  (07/20/2019),  (08/10/2019),  (09/08/2019)   for chemotherapy treatment.     10/02/2019 Imaging   CT neck As compared to 05/30/2019, no significant interval change in size of an ill-defined mass within the right lateral neck, again measuring 3.3 x 1.8 cm in transaxial dimensions.   Unchanged mildly enlarged left level I lymph node measuring 1.1 cm in short axis.   Unchanged node or nodule at the thoracic inlet, measuring 1.3 x 0.8 cm.   Please refer to concurrently performed chest CT for a description of findings below the level of the thoracic inlet.     10/02/2019 Imaging   CT chest 1. No new or progressive findings in the chest to suggest metastatic disease. 2. Bilateral subcentimeter solid pulmonary nodules are stable since 2018. 3. Hyperdense 1.1 cm anterior liver focus, not clearly visualized on prior studies. Suggest MRI abdomen without and with IV contrast for further characterization.   10/20/2019 - 12/29/2019 Chemotherapy   The patient had ondansetron (ZOFRAN) injection 8 mg, 8 mg (100 % of original dose 8 mg), Intravenous,  Once, 2 of 5 cycles Dose modification: 8 mg (original dose 8 mg, Cycle 2) Administration: 8 mg (11/17/2019), 8 mg (12/15/2019), 8 mg (12/29/2019) gemcitabine (GEMZAR) 2,000 mg in sodium chloride 0.9 % 250 mL chemo infusion, 2,090 mg, Intravenous,  Once, 3 of 6 cycles Administration: 2,000 mg (10/20/2019), 2,000 mg (11/03/2019), 2,000 mg (11/17/2019), 2,000 mg (12/15/2019), 2,000 mg (12/29/2019)   for chemotherapy treatment.     06/12/2020 Imaging   1. Enlarging superficial, exophytic component of the chronic right sternocleidomastoid muscle mass. See series 6, image 55. 2. Elsewhere stable CT appearance of the Neck.   06/12/2020 Imaging    Post treatment scarring in the left hemithorax, stable. No evidence recurrent or metastatic disease   06/14/2020 - 10/15/2020 Chemotherapy   He received carboplatin, 5FU and Beryle Flock        10/31/2020 Procedure   Interval improvement in right lateral lymph node mass. Improvement in dermal component as well as invasion of the right sternocleidomastoid muscle.   10 mm submental lymph node slightly enlarged compared to the prior study. Continued follow-up recommended.   11/01/2020 -  Chemotherapy    Patient is on Treatment Plan: HEAD/NECK PEMBROLIZUMAB Q21D         REVIEW OF SYSTEMS:   Constitutional: Denies fevers, chills or abnormal weight loss Eyes: Denies blurriness of vision Ears, nose, mouth, throat, and face: Denies mucositis or sore throat Respiratory: Denies cough, dyspnea or wheezes Cardiovascular: Denies palpitation, chest discomfort or lower extremity swelling Gastrointestinal:  Denies nausea, heartburn or change in bowel habits  Skin: Denies abnormal skin rashes Lymphatics: Denies new lymphadenopathy or easy bruising Neurological:Denies numbness, tingling or new weaknesses Behavioral/Psych: Mood is stable, no new changes  All other systems were reviewed with the patient and are negative.  I have reviewed the past medical history, past surgical history, social history and family history with the patient and they are unchanged from previous note.  ALLERGIES:  is allergic to phenergan [promethazine hcl], heparin, and clindamycin.  MEDICATIONS:  Current Outpatient Medications  Medication Sig Dispense Refill   Accu-Chek Softclix Lancets lancets CHECK BLOOD GLUCOSE ONCE A DAY IN THE MORNING. 100 each 12   atorvastatin (LIPITOR) 40 MG tablet TK 1 T PO  D     blood glucose meter kit and supplies KIT Dispense based on patient and insurance preference. Use up to four times daily as directed. (FOR ICD-9 250.00, 250.01). 1 each 1   Blood Glucose Monitoring Suppl (ACCU-CHEK  GUIDE) w/Device KIT USE ONCE DAILY AS DIRECTED IN THE MORNING 1 kit 0   cetirizine (ZYRTEC) 10 MG tablet TAKE 1 TABLET(10 MG) BY MOUTH DAILY 30 tablet 1   diclofenac Sodium (VOLTAREN) 1 % GEL Apply topically.     gabapentin (NEURONTIN) 300 MG capsule Take 1 capsule (300 mg total) by mouth 2 (two) times daily. 60 capsule 11   glucose blood test strip CHECK BLOOD GLUCOSE ONCE A DAY IN THE MORNING 100 strip 12   levothyroxine (SYNTHROID) 137 MCG tablet TAKE 1 TABLET BY MOUTH DAILY BEFORE BREAKFAST 90 tablet 9   LORazepam (ATIVAN) 1 MG tablet Take 1 tablet (1 mg total) by mouth every 8 (eight) hours as needed for anxiety. 30 tablet 0   metFORMIN (GLUCOPHAGE) 500 MG tablet TAKE 1 TABLET BY MOUTH 2 TIMES DAILY WITH A MEAL 60 tablet 2   omeprazole (PRILOSEC) 40 MG capsule Take 1 capsule (40 mg total) by mouth daily. 30 capsule 9   ondansetron (ZOFRAN) 8 MG tablet Take 1 tablet (8 mg total) by mouth every 8 (eight) hours as needed for nausea or vomiting. 90 tablet 1   oxyCODONE (OXY IR/ROXICODONE) 5 MG immediate release tablet Take 1 tablet (5 mg total) by mouth every 6 (six) hours as needed for severe pain. 90 tablet 0   PARoxetine (PAXIL) 20 MG tablet Take 1 tablet (20 mg total) by mouth daily. 30 tablet 5   polyethylene glycol (MIRALAX) packet Take 17 g by mouth daily. (Patient taking differently: Take 17 g by mouth daily as needed for mild constipation or moderate constipation. ) 14 each 3   senna-docusate (SENOKOT-S) 8.6-50 MG tablet Take 2 tablets by mouth 3 (three) times daily. 90 tablet 0   Vitamin D, Ergocalciferol, (DRISDOL) 1.25 MG (50000 UT) CAPS capsule TK 1 C PO WEEKLY     No current facility-administered medications for this visit.   Facility-Administered Medications Ordered in Other Visits  Medication Dose Route Frequency Provider Last Rate Last Admin   0.9 %  sodium chloride infusion   Intravenous Once Alvy Bimler, Antania Hoefling, MD       anticoagulant sodium citrate solution 5 mL  5 mL Intracatheter  Once Alvy Bimler, Percy Winterrowd, MD       anticoagulant sodium citrate solution 5 mL  5 mL Intracatheter Once Alvy Bimler, Marshel Golubski, MD       anticoagulant sodium citrate solution 5 mL  5 mL Intracatheter Once [name], MD        PHYSICAL EXAMINATION: ECOG PERFORMANCE STATUS: 0 - Asymptomatic  Vitals:   03/28/21 0921  BP: 132/69  Pulse: 87  Resp: 17  Temp: 98 F (36.7 C)  SpO2: 100%   Filed Weights   03/28/21 0921  Weight: 186 lb 12.8 oz (84.7 kg)    GENERAL:alert, no distress and comfortable SKIN: skin color, texture, turgor are normal, no rashes or significant lesions EYES: normal, Conjunctiva are pink and non-injected, sclera clear OROPHARYNX:no exudate, no erythema and lips, buccal mucosa, and tongue normal  NECK: supple, thyroid normal size, non-tender, without nodularity LYMPH:  no palpable lymphadenopathy in the cervical, axillary or inguinal LUNGS: clear to auscultation and percussion with normal breathing effort HEART: regular rate & rhythm and no murmurs and no lower extremity edema ABDOMEN:abdomen soft, non-tender and normal bowel sounds Musculoskeletal:no cyanosis of digits and no clubbing  NEURO: alert & oriented x 3 with fluent speech, no focal motor/sensory deficits  LABORATORY DATA:  I have reviewed the data as listed    Component Value Date/Time   NA 141 03/28/2021 0910   NA 139 09/22/2017 0829   K 3.7 03/28/2021 0910   K 3.5 09/22/2017 0829   CL 104 03/28/2021 0910   CO2 24 03/28/2021 0910   CO2 26 09/22/2017 0829   GLUCOSE 129 (H) 03/28/2021 0910   GLUCOSE 133 09/22/2017 0829   BUN 12 03/28/2021 0910   BUN 14.1 09/22/2017 0829   CREATININE 0.84 03/28/2021 0910   CREATININE 0.9 09/22/2017 0829   CALCIUM 9.3 03/28/2021 0910   CALCIUM 9.1 09/22/2017 0829   PROT 7.1 03/28/2021 0910   PROT 6.8 09/22/2017 0829   ALBUMIN 4.0 03/28/2021 0910   ALBUMIN 4.1 09/22/2017 0829   AST 25 03/28/2021 0910   AST 22 09/22/2017 0829   ALT 23 03/28/2021 0910   ALT 30 09/22/2017  0829   ALKPHOS 65 03/28/2021 0910   ALKPHOS 55 09/22/2017 0829   BILITOT 0.4 03/28/2021 0910   BILITOT 0.35 09/22/2017 0829   GFRNONAA >60 03/28/2021 0910   GFRAA >60 07/05/2020 0845   GFRAA >60 02/06/2019 1215    No results found for: SPEP, UPEP  Lab Results  Component Value Date   WBC 6.4 03/28/2021   NEUTROABS 3.9 03/28/2021   HGB 14.3 03/28/2021   HCT 42.6 03/28/2021   MCV 86.4 03/28/2021   PLT 209 03/28/2021      Chemistry      Component Value Date/Time   NA 141 03/28/2021 0910   NA 139 09/22/2017 0829   K 3.7 03/28/2021 0910   K 3.5 09/22/2017 0829   CL 104 03/28/2021 0910   CO2 24 03/28/2021 0910   CO2 26 09/22/2017 0829   BUN 12 03/28/2021 0910   BUN 14.1 09/22/2017 0829   CREATININE 0.84 03/28/2021 0910   CREATININE 0.9 09/22/2017 0829      Component Value Date/Time   CALCIUM 9.3 03/28/2021 0910   CALCIUM 9.1 09/22/2017 0829   ALKPHOS 65 03/28/2021 0910   ALKPHOS 55 09/22/2017 0829   AST 25 03/28/2021 0910   AST 22 09/22/2017 0829   ALT 23 03/28/2021 0910   ALT 30 09/22/2017 0829   BILITOT 0.4 03/28/2021 0910   BILITOT 0.35 09/22/2017 0829

## 2021-03-28 NOTE — Assessment & Plan Note (Signed)
He continues on metformin His blood sugar is improving dramatically He has lost some weight I continue to encourage him with dietary modification and lifestyle changes

## 2021-03-29 LAB — T4: T4, Total: 7.6 ug/dL (ref 4.5–12.0)

## 2021-04-08 ENCOUNTER — Other Ambulatory Visit (HOSPITAL_BASED_OUTPATIENT_CLINIC_OR_DEPARTMENT_OTHER): Payer: Self-pay

## 2021-04-08 ENCOUNTER — Other Ambulatory Visit: Payer: Self-pay | Admitting: Hematology and Oncology

## 2021-04-08 MED FILL — Levothyroxine Sodium Tab 137 MCG: ORAL | 90 days supply | Qty: 90 | Fill #0 | Status: AC

## 2021-04-09 ENCOUNTER — Other Ambulatory Visit (HOSPITAL_BASED_OUTPATIENT_CLINIC_OR_DEPARTMENT_OTHER): Payer: Self-pay

## 2021-04-09 ENCOUNTER — Encounter: Payer: Self-pay | Admitting: Hematology and Oncology

## 2021-04-09 MED ORDER — METFORMIN HCL 500 MG PO TABS
500.0000 mg | ORAL_TABLET | Freq: Two times a day (BID) | ORAL | 2 refills | Status: DC
  Filled 2021-04-09 (×2): qty 60, 30d supply, fill #0
  Filled 2021-05-21 – 2021-05-29 (×2): qty 60, 30d supply, fill #1

## 2021-04-18 ENCOUNTER — Inpatient Hospital Stay (HOSPITAL_BASED_OUTPATIENT_CLINIC_OR_DEPARTMENT_OTHER): Payer: Medicaid Other | Admitting: Hematology and Oncology

## 2021-04-18 ENCOUNTER — Encounter: Payer: Self-pay | Admitting: Hematology and Oncology

## 2021-04-18 ENCOUNTER — Inpatient Hospital Stay: Payer: Medicaid Other | Attending: Hematology and Oncology

## 2021-04-18 ENCOUNTER — Other Ambulatory Visit (HOSPITAL_COMMUNITY): Payer: Self-pay

## 2021-04-18 ENCOUNTER — Inpatient Hospital Stay: Payer: Medicaid Other

## 2021-04-18 ENCOUNTER — Other Ambulatory Visit: Payer: Self-pay

## 2021-04-18 DIAGNOSIS — C119 Malignant neoplasm of nasopharynx, unspecified: Secondary | ICD-10-CM | POA: Diagnosis present

## 2021-04-18 DIAGNOSIS — Z5112 Encounter for antineoplastic immunotherapy: Secondary | ICD-10-CM | POA: Diagnosis present

## 2021-04-18 DIAGNOSIS — E039 Hypothyroidism, unspecified: Secondary | ICD-10-CM

## 2021-04-18 DIAGNOSIS — G893 Neoplasm related pain (acute) (chronic): Secondary | ICD-10-CM | POA: Diagnosis not present

## 2021-04-18 DIAGNOSIS — C760 Malignant neoplasm of head, face and neck: Secondary | ICD-10-CM

## 2021-04-18 DIAGNOSIS — Z79899 Other long term (current) drug therapy: Secondary | ICD-10-CM | POA: Diagnosis not present

## 2021-04-18 DIAGNOSIS — Z7189 Other specified counseling: Secondary | ICD-10-CM

## 2021-04-18 DIAGNOSIS — C7800 Secondary malignant neoplasm of unspecified lung: Secondary | ICD-10-CM

## 2021-04-18 DIAGNOSIS — Z95828 Presence of other vascular implants and grafts: Secondary | ICD-10-CM

## 2021-04-18 LAB — CBC WITH DIFFERENTIAL (CANCER CENTER ONLY)
Abs Immature Granulocytes: 0.02 10*3/uL (ref 0.00–0.07)
Basophils Absolute: 0 10*3/uL (ref 0.0–0.1)
Basophils Relative: 0 %
Eosinophils Absolute: 0.3 10*3/uL (ref 0.0–0.5)
Eosinophils Relative: 3 %
HCT: 41.3 % (ref 39.0–52.0)
Hemoglobin: 13.8 g/dL (ref 13.0–17.0)
Immature Granulocytes: 0 %
Lymphocytes Relative: 18 %
Lymphs Abs: 1.7 10*3/uL (ref 0.7–4.0)
MCH: 29.2 pg (ref 26.0–34.0)
MCHC: 33.4 g/dL (ref 30.0–36.0)
MCV: 87.5 fL (ref 80.0–100.0)
Monocytes Absolute: 0.6 10*3/uL (ref 0.1–1.0)
Monocytes Relative: 6 %
Neutro Abs: 6.6 10*3/uL (ref 1.7–7.7)
Neutrophils Relative %: 73 %
Platelet Count: 202 10*3/uL (ref 150–400)
RBC: 4.72 MIL/uL (ref 4.22–5.81)
RDW: 14.7 % (ref 11.5–15.5)
WBC Count: 9.2 10*3/uL (ref 4.0–10.5)
nRBC: 0 % (ref 0.0–0.2)

## 2021-04-18 LAB — CMP (CANCER CENTER ONLY)
ALT: 17 U/L (ref 0–44)
AST: 22 U/L (ref 15–41)
Albumin: 4.1 g/dL (ref 3.5–5.0)
Alkaline Phosphatase: 61 U/L (ref 38–126)
Anion gap: 12 (ref 5–15)
BUN: 11 mg/dL (ref 6–20)
CO2: 25 mmol/L (ref 22–32)
Calcium: 9.4 mg/dL (ref 8.9–10.3)
Chloride: 102 mmol/L (ref 98–111)
Creatinine: 0.95 mg/dL (ref 0.61–1.24)
GFR, Estimated: >60 mL/min (ref >60)
Glucose, Bld: 133 mg/dL — ABNORMAL HIGH (ref 70–99)
Potassium: 4 mmol/L (ref 3.5–5.1)
Sodium: 139 mmol/L (ref 135–145)
Total Bilirubin: 0.3 mg/dL (ref 0.3–1.2)
Total Protein: 7.2 g/dL (ref 6.5–8.1)

## 2021-04-18 LAB — TSH: TSH: 6.713 u[IU]/mL — ABNORMAL HIGH (ref 0.320–4.118)

## 2021-04-18 MED ORDER — ANTICOAGULANT SODIUM CITRATE 4% (200MG/5ML) IV SOLN
5.0000 mL | Freq: Once | Status: AC
Start: 2021-04-18 — End: 2021-04-18
  Administered 2021-04-18: 5 mL
  Filled 2021-04-18: qty 5

## 2021-04-18 MED ORDER — OXYCODONE HCL 5 MG PO TABS
5.0000 mg | ORAL_TABLET | Freq: Four times a day (QID) | ORAL | 0 refills | Status: DC | PRN
  Filled 2021-04-18: qty 90, 22d supply, fill #0

## 2021-04-18 MED ORDER — ONDANSETRON HCL 4 MG/2ML IJ SOLN
INTRAMUSCULAR | Status: AC
  Filled 2021-04-18: qty 4

## 2021-04-18 MED ORDER — SODIUM CHLORIDE 0.9% FLUSH
10.0000 mL | Freq: Once | INTRAVENOUS | Status: AC
  Administered 2021-04-18: 10 mL
  Filled 2021-04-18: qty 10

## 2021-04-18 MED ORDER — SODIUM CHLORIDE 0.9 % IV SOLN
200.0000 mg | Freq: Once | INTRAVENOUS | Status: AC
  Administered 2021-04-18: 200 mg via INTRAVENOUS
  Filled 2021-04-18: qty 8

## 2021-04-18 MED ORDER — SODIUM CHLORIDE 0.9 % IV SOLN
Freq: Once | INTRAVENOUS | Status: AC
  Filled 2021-04-18: qty 250

## 2021-04-18 MED ORDER — ONDANSETRON HCL 4 MG/2ML IJ SOLN
8.0000 mg | Freq: Once | INTRAMUSCULAR | Status: AC
  Administered 2021-04-18: 8 mg via INTRAVENOUS

## 2021-04-18 MED ORDER — SODIUM CHLORIDE 0.9 % IV SOLN: 8.0000 mg | Freq: Once | INTRAVENOUS | Status: DC

## 2021-04-18 MED ORDER — SODIUM CHLORIDE 0.9% FLUSH
10.0000 mL | INTRAVENOUS | Status: DC | PRN
  Administered 2021-04-18: 10 mL
  Filled 2021-04-18: qty 10

## 2021-04-18 NOTE — Assessment & Plan Note (Signed)
He is successful in tapering down to oxycodone dose We will wean him off oxycodone very slowly For now, he will continue at current prescribed dose to use as needed

## 2021-04-18 NOTE — Progress Notes (Signed)
[place] OFFICE PROGRESS NOTE  Patient Care Team: Patient, No Pcp Per (Inactive) as PCP - General (General Practice) [name], MD as Consulting Physician (Hematology and Oncology) Eppie Gibson, MD as Attending Physician (Radiation Oncology) Jodi Marble, MD as Consulting Physician (Otolaryngology) Philomena Doheny, MD as Referring Physician (Plastic Surgery) Irene Shipper, MD as Consulting Physician (Gastroenterology) System, Provider Not In  ASSESSMENT & PLAN:  Malignant neoplasm of head and neck [facility]) His last imaging study shows stability I do not plan to repeat imaging study on a routine basis So far, he tolerated treatment very well  Cancer associated pain He is successful in tapering down to oxycodone dose We will wean him off oxycodone very slowly For now, he will continue at current prescribed dose to use as needed  Acquired hypothyroidism He stated he is compliant taking Synthroid I will monitor TSH closely and adjust the dose of medicine as needed  No orders of the defined types were placed in this encounter.   All questions were answered. The patient knows to call the clinic with any problems, questions or concerns. The total time spent in the appointment was 20 minutes encounter with patients including review of chart and various tests results, discussions about plan of care and coordination of care plan   [name], MD 04/18/2021 10:54 AM  INTERVAL HISTORY: Please see below for problem oriented charting. 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 Pan-endoscopy elsewhere showed cancer from nasopharyngeal space.    01/04/2007 - 02/20/2007 Chemotherapy   He received 2 cycles of cisplatin and 5FU followed by concurrent chemo with weekly cisplatin and radiation. He only received 2 doses of chemo due to severe mucositis, nausea and weight loss.    04/05/2007 - 08/04/2007 Chemotherapy   He received 4 more courses of cisplatin with 5FU and had complete response    07/05/2009 Procedure   Fine-needle aspirate of the right level II lymph nodes come from recurrent metastatic disease. Repeat endoscopy and CT scan show no evidence of disease elsewhere.    07/08/2009 - 12/02/2009 Chemotherapy   He was given 6 cycles of carboplatin, 5-FU and docetaxel    12/03/2009 Surgery   He has surgery to the residual lymph node on the right neck which showed no evidence of disease.    02/22/2012 Imaging   Repeat imaging study showed large recurrent mass. He was referred elsewhere for further treatment.    05/03/2012 Surgery   He underwent left upper lobectomy.    04/29/2013 Imaging   PEt scan showed lesion on right level II B and lower lung was abnormal    06/03/2013 - 02/02/2014 Chemotherapy   He had 6 cycles of chemotherapy when he was found to have recurrence  of cancer and had received oxaliplatin and capecitabine    06/07/2014 Imaging   PET CT scan showed persistent disease in the right neck lymph nodes and left lung    06/29/2014 Procedure   Accession: [id number] repeat LUL biopsy confirmed metastatic cancer    07/18/2014 - 07/31/2014 Radiation Therapy   He received palliative radiation therapy to the lungs    10/10/2014 Imaging   CT  scan of the chest, abdomen and pelvis show regression in the size of the lung nodule in the left upper lobe and stable pulmonary nodules    01/24/2015 Imaging   CT scan showed stable disease in neck and lung    06/19/2015 Imaging   CT scan of the neck and the chest show possible mild progression of the nodule in the right side of the neck.    06/25/2015 Imaging   PET scan confirmed disease recurrence in the neck    07/07/2015 Imaging   He had MRI neck at [facility]    09/03/2015 - 08/26/2018 Chemotherapy   He received palliative chemo with Nivolumab    10/29/2015 Imaging   PET CT showed positive response to Rx    02/28/2016 Imaging   Ct abdomen showed abnormal thinkening in his stomach    03/03/2016 Imaging   CT: Right sternocleidomastoid muscle metastasis appears less distinct but otherwise not significantly changed in size or configuration since 06/19/2015.2. Left level 3 lymph node which was hypermetabolic by PET-CT in January 2017 appears slightly smaller    04/01/2016 Imaging   CT cervical spine showed no acute fracture or traumatic malalignment in the cervical spine    04/22/2016 Procedure   Port-a-cath placed.    06/16/2016 Imaging   Ct neck showed right sternocleidomastoid muscle metastasis is further decreased in conspicuity since May, and has mildly decreased in size since September 2016. Continued stability of sub-centimeter left cervical lymph nodes. No new or progressive metastatic disease in the neck.    06/16/2016 Imaging   CT chest showed stable masslike radiation fibrosis in the left upper lobe. Stable subcentimeter pulmonary nodules in the bilateral lower lobes. No new or progressive metastatic disease in the chest. Nonobstructing left renal stone.    10/13/2016 Imaging   Ct neck showed unchanged right sternocleidomastoid muscle metastasis. Unchanged subcentimeter left cervical lymph nodes. No evidence of new or progressive metastatic disease in the neck.     10/13/2016 Imaging   CT chest showed tiny hypervascular foci in the liver, not definitely seen on prior imaging of 06/16/2016 and 02/28/2016. Abdomen MRI without and with contrast recommended to further evaluate as metastatic disease is a concern. 2. Stable appearance of post treatment changes left upper lung and scattered tiny bilateral pulmonary nodules.    02/11/2017 Imaging   Ct neck: Lymph node mass right posterior neck appears improved from the prior study. Small posterior lymph nodes on the left unchanged. Occluded right jugular vein unchanged.    02/11/2017 Imaging   1. Similar appearance of postsurgical and radiation changes in the left upper lobe. 2. Similar bilateral pulmonary nodules. 3. No thoracic adenopathy. 4. Subtle foci of post-contrast enhancement within the liver are suboptimally characterized on this nondedicated study. Likely similar. These could either be re-evaluated at followup or more entirely characterized with abdominal MRI. 5. Left nephrolithiasis.    05/19/2017 Imaging   Matted lymph node mass right posterior neck appears larger in the recent CT. Accurate measurements difficult due to infiltrating tumor margins and infiltration of the muscle. Right jugular vein again  appears occluded or resected. Small left posterior lymph nodes stable. Left upper lobe airspace density stable and similar to the prior CT    06/03/2017 PET scan   1. Hypermetabolic ill-defined right level IIb lymph node, about 1.3 cm in diameter with maximum SUV 9.5 (formerly 8.1). Appearance suspicious for residual/recurrent malignancy. No worrisome left-sided lesion. 2. Left suprahilar indistinct opacity demonstrates no worrisome hypermetabolic activity. The 5 mm left lower lobe pulmonary nodule is stable and not currently hypermetabolic although below sensitive PET-CT size thresholds. 3. Other imaging findings of potential clinical significance: Bilateral nonobstructive nephrolithiasis. Chronic  bilateral maxillary sinusitis.    05/11/2018 PET scan   1. Continued chronic accentuated metabolic activity in the vicinity of right level IIB and the adjacent right sternocleidomastoid muscle, with ill definition of surrounding tissue planes. Maximum SUV is currently 8.1, formerly 9.5. Accentuated metabolic activity is been present in this vicinity back through 06/25/2015, and there was also some low-level activity in this vicinity on 06/07/2014. Some of this may be from scarring and local muscular activity although clearly a component of residual tumor is difficult to exclude given the focally high activity. 2. Other imaging findings of potential clinical significance: Chronic bilateral maxillary sinusitis. Chronic scarring in the left upper lobe. Chronically stable 5 mm left lower lobe nodule is considered benign. Nonobstructive left nephrolithiasis.    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 COMMENT:No significant keratinization is identified. Other basaloid carcinomas are in the differential diagnosis. No cell block material is available for further testing.    09/12/2018 Procedure   He underwent fine Needle Aspiration    10/04/2018 PET scan   1. Significant progression of local recurrence laterally in the mid right neck with an enlarging, increasingly hypermetabolic soft tissue mass. This involves the right sternocleidomastoid muscle. 2. Small lymph nodes in the right axilla are increasingly hypermetabolic. These are nonspecific and potentially reactive, although could reflect a small metastases. Small hypermetabolic nodule in the left suprasternal notch is unchanged. 3. No other evidence of metastatic disease.      10/07/2018 - 12/23/2018 Chemotherapy   The patient had cisplatin plus gemzar    12/07/2018 Imaging   1. Decreased size of  lateral right neck mass. 2. Unchanged soft tissue nodule in the suprasternal notch. 3. No evidence of new metastatic disease in the neck.      05/30/2019 Imaging   CT neck No clear change or progression compared to the study of March. Overall measurements of the right lateral neck mass are similar, approximately 3 x 1.8 cm. See above discussion. One could argue that there is slight increase in lateral bulging, possibly with an increase in contrast enhancement, towards the inferior margin. This is of questionable validity but could possibly represent some progression or inflammatory change. Other findings in the region are stable.   07/20/2019 - 09/15/2019 Chemotherapy   The patient had dexamethasone (DECADRON) 4 MG tablet, 1 of 1 cycle, Start date: --, End date: -- palonosetron (ALOXI) injection 0.25 mg, 0.25 mg, Intravenous,  Once, 3 of 4 cycles Administration: 0.25 mg (07/20/2019), 0.25 mg (08/10/2019), 0.25 mg (09/08/2019) CISplatin (PLATINOL) 84 mg in sodium chloride 0.9 % 250 mL chemo infusion, 40 mg/m2 = 84 mg (80 % of original dose 50 mg/m2), Intravenous,  Once, 3 of 4 cycles Dose modification: 40 mg/m2 (80 % of original dose 50 mg/m2, Cycle  1, Reason: Dose Not Tolerated) Administration: 84 mg (07/20/2019), 84 mg (08/10/2019), 83 mg (09/08/2019) gemcitabine (GEMZAR) 1,600 mg in sodium chloride 0.9 % 250 mL chemo infusion, 1,672 mg (80 % of original dose 1,000 mg/m2), Intravenous,  Once, 3 of 4 cycles Dose modification: 800 mg/m2 (80 % of original dose 1,000 mg/m2, Cycle 1, Reason: Provider Judgment) Administration: 1,600 mg (07/20/2019), 1,600 mg (07/27/2019), 1,600 mg (08/10/2019), 1,600 mg (08/17/2019), 1,600 mg (09/08/2019), 1,672 mg (09/15/2019) ondansetron (ZOFRAN) 8 mg, dexamethasone (DECADRON) 10 mg in sodium chloride 0.9 % 50 mL IVPB, , Intravenous,  Once, 3 of 4 cycles Administration:  (09/15/2019) fosaprepitant (EMEND) 150 mg, dexamethasone (DECADRON) 12 mg in sodium chloride 0.9 %  145 mL IVPB, , Intravenous,  Once, 3 of 4 cycles Administration:  (07/20/2019),  (08/10/2019),  (09/08/2019)   for chemotherapy treatment.     10/02/2019 Imaging   CT neck As compared to 05/30/2019, no significant interval change in size of an ill-defined mass within the right lateral neck, again measuring 3.3 x 1.8 cm in transaxial dimensions.   Unchanged mildly enlarged left level I lymph node measuring 1.1 cm in short axis.   Unchanged node or nodule at the thoracic inlet, measuring 1.3 x 0.8 cm.   Please refer to concurrently performed chest CT for a description of findings below the level of the thoracic inlet.     10/02/2019 Imaging   CT chest 1. No new or progressive findings in the chest to suggest metastatic disease. 2. Bilateral subcentimeter solid pulmonary nodules are stable since 2018. 3. Hyperdense 1.1 cm anterior liver focus, not clearly visualized on prior studies. Suggest MRI abdomen without and with IV contrast for further characterization.   10/20/2019 - 12/29/2019 Chemotherapy   The patient had ondansetron (ZOFRAN) injection 8 mg, 8 mg (100 % of original dose 8 mg), Intravenous,  Once, 2 of 5 cycles Dose modification: 8 mg (original dose 8 mg, Cycle 2) Administration: 8 mg (11/17/2019), 8 mg (12/15/2019), 8 mg (12/29/2019) gemcitabine (GEMZAR) 2,000 mg in sodium chloride 0.9 % 250 mL chemo infusion, 2,090 mg, Intravenous,  Once, 3 of 6 cycles Administration: 2,000 mg (10/20/2019), 2,000 mg (11/03/2019), 2,000 mg (11/17/2019), 2,000 mg (12/15/2019), 2,000 mg (12/29/2019)   for chemotherapy treatment.     06/12/2020 Imaging   1. Enlarging superficial, exophytic component of the chronic right sternocleidomastoid muscle mass. See series 6, image 55. 2. Elsewhere stable CT appearance of the Neck.   06/12/2020 Imaging   Post treatment scarring in the left hemithorax, stable. No evidence recurrent or metastatic disease   06/14/2020 - 10/15/2020 Chemotherapy   He received  carboplatin, 5FU and Beryle Flock        10/31/2020 Procedure   Interval improvement in right lateral lymph node mass. Improvement in dermal component as well as invasion of the right sternocleidomastoid muscle.   10 mm submental lymph node slightly enlarged compared to the prior study. Continued follow-up recommended.   11/01/2020 -  Chemotherapy    Patient is on Treatment Plan: HEAD/NECK PEMBROLIZUMAB Q21D         REVIEW OF SYSTEMS:   Constitutional: Denies fevers, chills or abnormal weight loss Eyes: Denies blurriness of vision Ears, nose, mouth, throat, and face: Denies mucositis or sore throat Respiratory: Denies cough, dyspnea or wheezes Cardiovascular: Denies palpitation, chest discomfort or lower extremity swelling Gastrointestinal:  Denies nausea, heartburn or change in bowel habits Skin: Denies abnormal skin rashes Lymphatics: Denies new lymphadenopathy or easy bruising Neurological:Denies numbness, tingling or new weaknesses Behavioral/Psych:  Mood is stable, no new changes  All other systems were reviewed with the patient and are negative.  I have reviewed the past medical history, past surgical history, social history and family history with the patient and they are unchanged from previous note.  ALLERGIES:  is allergic to phenergan [promethazine hcl], heparin, and clindamycin.  MEDICATIONS:  Current Outpatient Medications  Medication Sig Dispense Refill   Accu-Chek Softclix Lancets lancets CHECK BLOOD GLUCOSE ONCE A DAY IN THE MORNING. 100 each 12   atorvastatin (LIPITOR) 40 MG tablet TK 1 T PO  D     blood glucose meter kit and supplies KIT Dispense based on patient and insurance preference. Use up to four times daily as directed. (FOR ICD-9 250.00, 250.01). 1 each 1   Blood Glucose Monitoring Suppl (ACCU-CHEK GUIDE) w/Device KIT USE ONCE DAILY AS DIRECTED IN THE MORNING 1 kit 0   cetirizine (ZYRTEC) 10 MG tablet TAKE 1 TABLET(10 MG) BY MOUTH DAILY 30 tablet 1    diclofenac Sodium (VOLTAREN) 1 % GEL Apply topically.     gabapentin (NEURONTIN) 300 MG capsule Take 1 capsule (300 mg total) by mouth 2 (two) times daily. 60 capsule 11   glucose blood test strip CHECK BLOOD GLUCOSE ONCE A DAY IN THE MORNING 100 strip 12   levothyroxine (SYNTHROID) 137 MCG tablet TAKE 1 TABLET BY MOUTH DAILY BEFORE BREAKFAST 90 tablet 9   LORazepam (ATIVAN) 1 MG tablet Take 1 tablet (1 mg total) by mouth every 8 (eight) hours as needed for anxiety. 30 tablet 0   metFORMIN (GLUCOPHAGE) 500 MG tablet TAKE 1 TABLET BY MOUTH 2 TIMES DAILY WITH A MEAL 60 tablet 2   omeprazole (PRILOSEC) 40 MG capsule Take 1 capsule (40 mg total) by mouth daily. 30 capsule 9   ondansetron (ZOFRAN) 8 MG tablet Take 1 tablet (8 mg total) by mouth every 8 (eight) hours as needed for nausea or vomiting. 90 tablet 1   oxyCODONE (OXY IR/ROXICODONE) 5 MG immediate release tablet Take 1 tablet (5 mg total) by mouth every 6 (six) hours as needed for severe pain. 90 tablet 0   PARoxetine (PAXIL) 20 MG tablet Take 1 tablet (20 mg total) by mouth daily. 30 tablet 5   polyethylene glycol (MIRALAX) packet Take 17 g by mouth daily. (Patient taking differently: Take 17 g by mouth daily as needed for mild constipation or moderate constipation. ) 14 each 3   Vitamin D, Ergocalciferol, (DRISDOL) 1.25 MG (50000 UT) CAPS capsule TK 1 C PO WEEKLY     No current facility-administered medications for this visit.   Facility-Administered Medications Ordered in Other Visits  Medication Dose Route Frequency Provider Last Rate Last Admin   anticoagulant sodium citrate solution 5 mL  5 mL Intracatheter Once Alvy Bimler, Elijah Michaelis, MD       anticoagulant sodium citrate solution 5 mL  5 mL Intracatheter Once Alvy Bimler, Swati Granberry, MD       anticoagulant sodium citrate solution 5 mL  5 mL Intracatheter Once Alvy Bimler, Calob Baskette, MD       pembrolizumab (KEYTRUDA) 200 mg in sodium chloride 0.9 % 50 mL chemo infusion  200 mg Intravenous Once Alvy Bimler, Kateena Degroote, MD 116  mL/hr at 04/18/21 1039 200 mg at 04/18/21 1039   sodium chloride flush (NS) 0.9 % injection 10 mL  10 mL Intracatheter PRN [name], MD        PHYSICAL EXAMINATION: ECOG PERFORMANCE STATUS: 1 - Symptomatic but completely ambulatory  Vitals:   04/18/21 0915  BP: 122/76  Pulse: 87  Resp: 15  Temp: 97.6 F (36.4 C)  SpO2: 100%   Filed Weights   04/18/21 0915  Weight: 185 lb 6.4 oz (84.1 kg)    GENERAL:alert, no distress and comfortable SKIN: skin color, texture, turgor are normal, no rashes or significant lesions EYES: normal, Conjunctiva are pink and non-injected, sclera clear OROPHARYNX:no exudate, no erythema and lips, buccal mucosa, and tongue normal  NECK: supple, thyroid normal size, non-tender, without nodularity LYMPH:  no palpable lymphadenopathy in the cervical, axillary or inguinal LUNGS: clear to auscultation and percussion with normal breathing effort HEART: regular rate & rhythm and no murmurs and no lower extremity edema ABDOMEN:abdomen soft, non-tender and normal bowel sounds Musculoskeletal:no cyanosis of digits and no clubbing  NEURO: alert & oriented x 3 with fluent speech, no focal motor/sensory deficits  LABORATORY DATA:  I have reviewed the data as listed    Component Value Date/Time   NA 139 04/18/2021 0846   NA 139 09/22/2017 0829   K 4.0 04/18/2021 0846   K 3.5 09/22/2017 0829   CL 102 04/18/2021 0846   CO2 25 04/18/2021 0846   CO2 26 09/22/2017 0829   GLUCOSE 133 (H) 04/18/2021 0846   GLUCOSE 133 09/22/2017 0829   BUN 11 04/18/2021 0846   BUN 14.1 09/22/2017 0829   CREATININE 0.95 04/18/2021 0846   CREATININE 0.9 09/22/2017 0829   CALCIUM 9.4 04/18/2021 0846   CALCIUM 9.1 09/22/2017 0829   PROT 7.2 04/18/2021 0846   PROT 6.8 09/22/2017 0829   ALBUMIN 4.1 04/18/2021 0846   ALBUMIN 4.1 09/22/2017 0829   AST 22 04/18/2021 0846   AST 22 09/22/2017 0829   ALT 17 04/18/2021 0846   ALT 30 09/22/2017 0829   ALKPHOS 61 04/18/2021 0846    ALKPHOS 55 09/22/2017 0829   BILITOT 0.3 04/18/2021 0846   BILITOT 0.35 09/22/2017 0829   GFRNONAA >60 04/18/2021 0846   GFRAA >60 07/05/2020 0845   GFRAA >60 02/06/2019 1215    No results found for: SPEP, UPEP  Lab Results  Component Value Date   WBC 9.2 04/18/2021   NEUTROABS 6.6 04/18/2021   HGB 13.8 04/18/2021   HCT 41.3 04/18/2021   MCV 87.5 04/18/2021   PLT 202 04/18/2021      Chemistry      Component Value Date/Time   NA 139 04/18/2021 0846   NA 139 09/22/2017 0829   K 4.0 04/18/2021 0846   K 3.5 09/22/2017 0829   CL 102 04/18/2021 0846   CO2 25 04/18/2021 0846   CO2 26 09/22/2017 0829   BUN 11 04/18/2021 0846   BUN 14.1 09/22/2017 0829   CREATININE 0.95 04/18/2021 0846   CREATININE 0.9 09/22/2017 0829      Component Value Date/Time   CALCIUM 9.4 04/18/2021 0846   CALCIUM 9.1 09/22/2017 0829   ALKPHOS 61 04/18/2021 0846   ALKPHOS 55 09/22/2017 0829   AST 22 04/18/2021 0846   AST 22 09/22/2017 0829   ALT 17 04/18/2021 0846   ALT 30 09/22/2017 0829   BILITOT 0.3 04/18/2021 0846   BILITOT 0.35 09/22/2017 0829

## 2021-04-18 NOTE — Assessment & Plan Note (Signed)
His last imaging study shows stability I do not plan to repeat imaging study on a routine basis So far, he tolerated treatment very well

## 2021-04-18 NOTE — Patient Instructions (Signed)
[facility]COL[facility]  Discharge Instructions: Thank you for choosing [facility] to provide your oncology and hematology care.   If you have a lab appointment with the [facility], please go directly to the [facility] and check in at the registration area.   Wear comfortable clothing and clothing appropriate for easy access to any Portacath or PICC line.   We strive to give you quality time with your provider. You may need to reschedule your appointment if you arrive late (15 or more minutes).  Arriving late affects you and other patients whose appointments are after yours.  Also, if you miss three or more appointments without notifying the office, you may be dismissed from the clinic at the provider's discretion.      For prescription refill requests, have your pharmacy contact our office and allow 72 hours for refills to be completed.    Today you received the following chemotherapy and/or immunotherapy agents Beryle Flock      To help prevent nausea and vomiting after your treatment, we encourage you to take your nausea medication as directed.  BELOW ARE SYMPTOMS THAT SHOULD BE REPORTED IMMEDIATELY: *FEVER GREATER THAN 100.4 F (38 C) OR HIGHER *CHILLS OR SWEATING *NAUSEA AND VOMITING THAT IS NOT CONTROLLED WITH YOUR NAUSEA MEDICATION *UNUSUAL SHORTNESS OF BREATH *UNUSUAL BRUISING OR BLEEDING *URINARY PROBLEMS (pain or burning when urinating, or frequent urination) *BOWEL PROBLEMS (unusual diarrhea, constipation, pain near the anus) TENDERNESS IN MOUTH AND THROAT WITH OR WITHOUT PRESENCE OF ULCERS (sore throat, sores in mouth, or a toothache) UNUSUAL RASH, SWELLING OR PAIN  UNUSUAL VAGINAL DISCHARGE OR ITCHING   Items with * indicate a potential emergency and should be followed up as soon as possible or go to the Emergency Department if any problems should occur.  Please show the CHEMOTHERAPY ALERT CARD or IMMUNOTHERAPY ALERT CARD at check-in to  the Emergency Department and triage nurse.  Should you have questions after your visit or need to cancel or reschedule your appointment, please contact [facility]  Dept: [phone]  and follow the prompts.  Office hours are 8:00 a.m. to 4:30 p.m. Monday - Friday. Please note that voicemails left after 4:00 p.m. may not be returned until the following business day.  We are closed weekends and major holidays. You have access to a nurse at all times for urgent questions. Please call the main number to the clinic Dept: [phone] and follow the prompts.   For any non-urgent questions, you may also contact your provider using MyChart. We now offer e-Visits for anyone 6 and older to request care online for non-urgent symptoms. For details visit [email or web address].   Also download the MyChart app! Go to the app store, search "MyChart", open the app, select [facility], and log in with your MyChart username and password.  Due to Covid, a mask is required upon entering the hospital/clinic. If you do not have a mask, one will be given to you upon arrival. For doctor visits, patients may have 1 support person aged 77 or older with them. For treatment visits, patients cannot have anyone with them due to current Covid guidelines and our immunocompromised population.

## 2021-04-18 NOTE — Assessment & Plan Note (Signed)
He stated he is compliant taking Synthroid I will monitor TSH closely and adjust the dose of medicine as needed

## 2021-04-19 LAB — T4: T4, Total: 5.2 ug/dL (ref 4.5–12.0)

## 2021-05-09 ENCOUNTER — Other Ambulatory Visit: Payer: Self-pay | Admitting: Hematology and Oncology

## 2021-05-09 ENCOUNTER — Inpatient Hospital Stay: Payer: Medicaid Other

## 2021-05-09 ENCOUNTER — Inpatient Hospital Stay: Payer: Medicaid Other | Attending: Hematology and Oncology

## 2021-05-09 ENCOUNTER — Other Ambulatory Visit (HOSPITAL_COMMUNITY): Payer: Self-pay

## 2021-05-09 ENCOUNTER — Inpatient Hospital Stay (HOSPITAL_BASED_OUTPATIENT_CLINIC_OR_DEPARTMENT_OTHER): Payer: Medicaid Other | Admitting: Hematology and Oncology

## 2021-05-09 ENCOUNTER — Other Ambulatory Visit: Payer: Self-pay

## 2021-05-09 ENCOUNTER — Inpatient Hospital Stay (HOSPITAL_BASED_OUTPATIENT_CLINIC_OR_DEPARTMENT_OTHER): Payer: Medicaid Other

## 2021-05-09 DIAGNOSIS — Z5112 Encounter for antineoplastic immunotherapy: Secondary | ICD-10-CM | POA: Diagnosis present

## 2021-05-09 DIAGNOSIS — G893 Neoplasm related pain (acute) (chronic): Secondary | ICD-10-CM | POA: Diagnosis not present

## 2021-05-09 DIAGNOSIS — C760 Malignant neoplasm of head, face and neck: Secondary | ICD-10-CM | POA: Diagnosis not present

## 2021-05-09 DIAGNOSIS — C7800 Secondary malignant neoplasm of unspecified lung: Secondary | ICD-10-CM

## 2021-05-09 DIAGNOSIS — Z23 Encounter for immunization: Secondary | ICD-10-CM

## 2021-05-09 DIAGNOSIS — Z95828 Presence of other vascular implants and grafts: Secondary | ICD-10-CM

## 2021-05-09 DIAGNOSIS — C119 Malignant neoplasm of nasopharynx, unspecified: Secondary | ICD-10-CM | POA: Diagnosis present

## 2021-05-09 DIAGNOSIS — Z79899 Other long term (current) drug therapy: Secondary | ICD-10-CM | POA: Insufficient documentation

## 2021-05-09 DIAGNOSIS — E039 Hypothyroidism, unspecified: Secondary | ICD-10-CM

## 2021-05-09 DIAGNOSIS — Z7189 Other specified counseling: Secondary | ICD-10-CM

## 2021-05-09 LAB — CBC WITH DIFFERENTIAL (CANCER CENTER ONLY)
Abs Immature Granulocytes: 0.01 10*3/uL (ref 0.00–0.07)
Basophils Absolute: 0 10*3/uL (ref 0.0–0.1)
Basophils Relative: 1 %
Eosinophils Absolute: 0.3 10*3/uL (ref 0.0–0.5)
Eosinophils Relative: 5 %
HCT: 42.1 % (ref 39.0–52.0)
Hemoglobin: 14.2 g/dL (ref 13.0–17.0)
Immature Granulocytes: 0 %
Lymphocytes Relative: 31 %
Lymphs Abs: 1.6 10*3/uL (ref 0.7–4.0)
MCH: 29.2 pg (ref 26.0–34.0)
MCHC: 33.7 g/dL (ref 30.0–36.0)
MCV: 86.4 fL (ref 80.0–100.0)
Monocytes Absolute: 0.4 10*3/uL (ref 0.1–1.0)
Monocytes Relative: 8 %
Neutro Abs: 3 10*3/uL (ref 1.7–7.7)
Neutrophils Relative %: 55 %
Platelet Count: 205 10*3/uL (ref 150–400)
RBC: 4.87 MIL/uL (ref 4.22–5.81)
RDW: 14.1 % (ref 11.5–15.5)
WBC Count: 5.3 10*3/uL (ref 4.0–10.5)
nRBC: 0 % (ref 0.0–0.2)

## 2021-05-09 LAB — CMP (CANCER CENTER ONLY)
ALT: 20 U/L (ref 0–44)
AST: 19 U/L (ref 15–41)
Albumin: 4.1 g/dL (ref 3.5–5.0)
Alkaline Phosphatase: 60 U/L (ref 38–126)
Anion gap: 11 (ref 5–15)
BUN: 11 mg/dL (ref 6–20)
CO2: 23 mmol/L (ref 22–32)
Calcium: 9.8 mg/dL (ref 8.9–10.3)
Chloride: 104 mmol/L (ref 98–111)
Creatinine: 0.86 mg/dL (ref 0.61–1.24)
GFR, Estimated: >60 mL/min (ref >60)
Glucose, Bld: 171 mg/dL — ABNORMAL HIGH (ref 70–99)
Potassium: 3.9 mmol/L (ref 3.5–5.1)
Sodium: 138 mmol/L (ref 135–145)
Total Bilirubin: 0.3 mg/dL (ref 0.3–1.2)
Total Protein: 7.2 g/dL (ref 6.5–8.1)

## 2021-05-09 LAB — TSH: TSH: 0.306 u[IU]/mL — ABNORMAL LOW (ref 0.320–4.118)

## 2021-05-09 MED ORDER — ANTICOAGULANT SODIUM CITRATE 4% (200MG/5ML) IV SOLN: 5.0000 mL | Freq: Once | Status: DC

## 2021-05-09 MED ORDER — ANTICOAGULANT SODIUM CITRATE LOCK FLUSH 4% (120 MG/3ML) IV SOLN
5.0000 mL | PREFILLED_SYRINGE | Freq: Once | INTRAVENOUS | Status: AC
  Administered 2021-05-09: 5 mL
  Filled 2021-05-09: qty 6

## 2021-05-09 MED ORDER — LEVOTHYROXINE SODIUM 112 MCG PO TABS
112.0000 ug | ORAL_TABLET | Freq: Every day | ORAL | 1 refills | Status: DC
  Filled 2021-05-09: qty 90, 90d supply, fill #0

## 2021-05-09 MED ORDER — SODIUM CHLORIDE 0.9 % IV SOLN
Freq: Once | INTRAVENOUS | Status: AC
  Filled 2021-05-09: qty 250

## 2021-05-09 MED ORDER — ONDANSETRON HCL 4 MG/2ML IJ SOLN
INTRAMUSCULAR | Status: AC
  Filled 2021-05-09: qty 4

## 2021-05-09 MED ORDER — ALTEPLASE 2 MG IJ SOLR
2.0000 mg | Freq: Once | INTRAMUSCULAR | Status: AC
  Administered 2021-05-09: 2 mg
  Filled 2021-05-09: qty 2

## 2021-05-09 MED ORDER — SODIUM CHLORIDE 0.9 % IV SOLN: 8.0000 mg | Freq: Once | INTRAVENOUS | Status: DC

## 2021-05-09 MED ORDER — ONDANSETRON HCL 4 MG/2ML IJ SOLN
8.0000 mg | Freq: Once | INTRAMUSCULAR | Status: AC
  Administered 2021-05-09: 8 mg via INTRAVENOUS

## 2021-05-09 MED ORDER — SODIUM CHLORIDE 0.9% FLUSH
10.0000 mL | INTRAVENOUS | Status: DC | PRN
  Administered 2021-05-09: 10 mL
  Filled 2021-05-09: qty 10

## 2021-05-09 MED ORDER — SODIUM CHLORIDE 0.9 % IV SOLN
200.0000 mg | Freq: Once | INTRAVENOUS | Status: AC
  Administered 2021-05-09: 200 mg via INTRAVENOUS
  Filled 2021-05-09: qty 8

## 2021-05-09 MED ORDER — ALTEPLASE 2 MG IJ SOLR
INTRAMUSCULAR | Status: AC
  Filled 2021-05-09: qty 2

## 2021-05-09 MED ORDER — SODIUM CHLORIDE 0.9% FLUSH
10.0000 mL | Freq: Once | INTRAVENOUS | Status: AC
  Administered 2021-05-09: 10 mL
  Filled 2021-05-09: qty 10

## 2021-05-09 NOTE — Patient Instructions (Signed)
[facility]  Discharge Instructions: ?Thank you for choosing [facility] to provide your oncology and hematology care.  ? ?If you have a lab appointment with the Cancer Center, please go directly to the Cancer Center and check in at the registration area. ?  ?Wear comfortable clothing and clothing appropriate for easy access to any Portacath or PICC line.  ? ?We strive to give you quality time with your provider. You may need to reschedule your appointment if you arrive late (15 or more minutes).  Arriving late affects you and other patients whose appointments are after yours.  Also, if you miss three or more appointments without notifying the office, you may be dismissed from the clinic at the provider?s discretion.    ?  ?For prescription refill requests, have your pharmacy contact our office and allow 72 hours for refills to be completed.   ? ?Today you received the following chemotherapy and/or immunotherapy agents: Keytruda ?  ?To help prevent nausea and vomiting after your treatment, we encourage you to take your nausea medication as directed. ? ?BELOW ARE SYMPTOMS THAT SHOULD BE REPORTED IMMEDIATELY: ?*FEVER GREATER THAN 100.4 F (38 ?C) OR HIGHER ?*CHILLS OR SWEATING ?*NAUSEA AND VOMITING THAT IS NOT CONTROLLED WITH YOUR NAUSEA MEDICATION ?*UNUSUAL SHORTNESS OF BREATH ?*UNUSUAL BRUISING OR BLEEDING ?*URINARY PROBLEMS (pain or burning when urinating, or frequent urination) ?*BOWEL PROBLEMS (unusual diarrhea, constipation, pain near the anus) ?TENDERNESS IN MOUTH AND THROAT WITH OR WITHOUT PRESENCE OF ULCERS (sore throat, sores in mouth, or a toothache) ?UNUSUAL RASH, SWELLING OR PAIN  ?UNUSUAL VAGINAL DISCHARGE OR ITCHING  ? ?Items with * indicate a potential emergency and should be followed up as soon as possible or go to the Emergency Department if any problems should occur. ? ?Please show the CHEMOTHERAPY ALERT CARD or IMMUNOTHERAPY ALERT CARD at check-in to the  Emergency Department and triage nurse. ? ?Should you have questions after your visit or need to cancel or reschedule your appointment, please contact [facility]  Dept: [phone]  and follow the prompts.  Office hours are 8:00 a.m. to 4:30 p.m. Monday - Friday. Please note that voicemails left after 4:00 p.m. may not be returned until the following business day.  We are closed weekends and major holidays. You have access to a nurse at all times for urgent questions. Please call the main number to the clinic Dept: [phone] and follow the prompts. ? ? ?For any non-urgent questions, you may also contact your provider using MyChart. We now offer e-Visits for anyone 18 and older to request care online for non-urgent symptoms. For details visit [email or web address]. ?  ?Also download the MyChart app! Go to the app store, search "MyChart", open the app, select [facility], and log in with your MyChart username and password. ? ?Due to Covid, a mask is required upon entering the hospital/clinic. If you do not have a mask, one will be given to you upon arrival. For doctor visits, patients may have 1 support person aged 18 or older with them. For treatment visits, patients cannot have anyone with them due to current Covid guidelines and our immunocompromised population.  ? ?

## 2021-05-10 LAB — T4: T4, Total: 8.8 ug/dL (ref 4.5–12.0)

## 2021-05-11 ENCOUNTER — Encounter: Payer: Self-pay | Admitting: Hematology and Oncology

## 2021-05-11 NOTE — Progress Notes (Signed)
[facility] OFFICE PROGRESS NOTE  Patient Care Team: Patient, No Pcp Per (Inactive) as PCP - General (General Practice) [name], MD as Consulting Physician (Hematology and Oncology) Eppie Gibson, MD as Attending Physician (Radiation Oncology) Jodi Marble, MD as Consulting Physician (Otolaryngology) Philomena Doheny, MD as Referring Physician (Plastic Surgery) Irene Shipper, MD as Consulting Physician (Gastroenterology) System, Provider Not In  ASSESSMENT & PLAN:  Malignant neoplasm of head and neck [facility]) His last imaging study shows stability I do not plan to repeat imaging study on a routine basis So far, he tolerated treatment very well  Acquired hypothyroidism He stated he is compliant taking Synthroid TSH is suppressed, will adjust the dose of medicine as needed  Cancer associated pain He is successful in tapering down to oxycodone dose We will wean him off oxycodone very slowly For now, he will continue at current prescribed dose to use as needed  No orders of the defined types were placed in this encounter.   All questions were answered. The patient knows to call the clinic with any problems, questions or concerns. The total time spent in the appointment was 20 minutes encounter with patients including review of chart and various tests results, discussions about plan of care and coordination of care plan   [name], MD 05/11/2021 12:50 PM  INTERVAL HISTORY: Please see below for problem oriented charting. 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 Pan-endoscopy elsewhere showed cancer from nasopharyngeal space.    01/04/2007 - 02/20/2007 Chemotherapy   He received 2 cycles of cisplatin and 5FU followed by concurrent chemo with weekly cisplatin and radiation. He only received 2 doses of chemo due to severe mucositis, nausea and weight loss.    04/05/2007 - 08/04/2007 Chemotherapy   He received 4 more courses of cisplatin with 5FU and had complete response    07/05/2009 Procedure   Fine-needle aspirate of the right level II lymph nodes come from recurrent metastatic disease. Repeat endoscopy and CT scan show no evidence of disease elsewhere.    07/08/2009 - 12/02/2009 Chemotherapy   He was given 6 cycles of carboplatin, 5-FU and docetaxel    12/03/2009 Surgery   He has surgery to the residual lymph node on the right neck which showed no evidence of disease.    02/22/2012 Imaging   Repeat imaging study showed large recurrent mass. He was referred elsewhere for further treatment.    05/03/2012 Surgery   He underwent left upper lobectomy.    04/29/2013 Imaging   PEt scan showed lesion on right level II B and lower lung was abnormal    06/03/2013 - 02/02/2014 Chemotherapy   He had 6 cycles of chemotherapy when he was found to have recurrence of cancer and had received oxaliplatin and capecitabine    06/07/2014 Imaging   PET CT scan showed  persistent disease in the right neck lymph nodes and left lung    06/29/2014 Procedure   Accession: [id number] repeat LUL biopsy confirmed metastatic cancer    07/18/2014 - 07/31/2014 Radiation Therapy   He received palliative radiation therapy to the lungs    10/10/2014 Imaging   CT scan of the chest, abdomen and pelvis show regression in the size of the lung nodule in the left upper lobe  and stable pulmonary nodules    01/24/2015 Imaging   CT scan showed stable disease in neck and lung    06/19/2015 Imaging   CT scan of the neck and the chest show possible mild progression of the nodule in the right side of the neck.    06/25/2015 Imaging   PET scan confirmed disease recurrence in the neck    07/07/2015 Imaging   He had MRI neck [facility]    09/03/2015 - 08/26/2018 Chemotherapy   He received palliative chemo with Nivolumab    10/29/2015 Imaging   PET CT showed positive response to Rx    02/28/2016 Imaging   Ct abdomen showed abnormal thinkening in his stomach    03/03/2016 Imaging   CT: Right sternocleidomastoid muscle metastasis appears less distinct but otherwise not significantly changed in size or configuration since 06/19/2015.2. Left level 3 lymph node which was hypermetabolic by PET-CT in January 2017 appears slightly smaller    04/01/2016 Imaging   CT cervical spine showed no acute fracture or traumatic malalignment in the cervical spine    04/22/2016 Procedure   Port-a-cath placed.    06/16/2016 Imaging   Ct neck showed right sternocleidomastoid muscle metastasis is further decreased in conspicuity since May, and has mildly decreased in size since September 2016. Continued stability of sub-centimeter left cervical lymph nodes. No new or progressive metastatic disease in the neck.    06/16/2016 Imaging   CT chest showed stable masslike radiation fibrosis in the left upper lobe. Stable subcentimeter pulmonary nodules in the bilateral lower lobes. No new or progressive metastatic disease in the chest. Nonobstructing left renal stone.    10/13/2016 Imaging   Ct neck showed unchanged right sternocleidomastoid muscle metastasis. Unchanged subcentimeter left cervical lymph nodes. No evidence of new or progressive metastatic disease in the neck.    10/13/2016 Imaging   CT chest showed tiny hypervascular foci in the liver, not definitely seen on prior imaging  of 06/16/2016 and 02/28/2016. Abdomen MRI without and with contrast recommended to further evaluate as metastatic disease is a concern. 2. Stable appearance of post treatment changes left upper lung and scattered tiny bilateral pulmonary nodules.    02/11/2017 Imaging   Ct neck: Lymph node mass right posterior neck appears improved from the prior study. Small posterior lymph nodes on the left unchanged. Occluded right jugular vein unchanged.    02/11/2017 Imaging   1. Similar appearance of postsurgical and radiation changes in the left upper lobe. 2. Similar bilateral pulmonary nodules. 3. No thoracic adenopathy. 4. Subtle foci of post-contrast enhancement within the liver are suboptimally characterized on this nondedicated study. Likely similar. These could either be re-evaluated at followup or more entirely characterized with abdominal MRI. 5. Left nephrolithiasis.    05/19/2017 Imaging   Matted lymph node mass right posterior neck appears larger in the recent CT. Accurate measurements difficult due to infiltrating tumor margins and infiltration of the muscle. Right jugular vein again appears occluded or resected. Small left posterior lymph nodes stable. Left upper lobe airspace density stable and similar to  the prior CT    06/03/2017 PET scan   1. Hypermetabolic ill-defined right level IIb lymph node, about 1.3 cm in diameter with maximum SUV 9.5 (formerly 8.1). Appearance suspicious for residual/recurrent malignancy. No worrisome left-sided lesion. 2. Left suprahilar indistinct opacity demonstrates no worrisome hypermetabolic activity. The 5 mm left lower lobe pulmonary nodule is stable and not currently hypermetabolic although below sensitive PET-CT size thresholds. 3. Other imaging findings of potential clinical significance: Bilateral nonobstructive nephrolithiasis. Chronic bilateral maxillary sinusitis.    05/11/2018 PET scan   1. Continued chronic accentuated metabolic activity in the  vicinity of right level IIB and the adjacent right sternocleidomastoid muscle, with ill definition of surrounding tissue planes. Maximum SUV is currently 8.1, formerly 9.5. Accentuated metabolic activity is been present in this vicinity back through 06/25/2015, and there was also some low-level activity in this vicinity on 06/07/2014. Some of this may be from scarring and local muscular activity although clearly a component of residual tumor is difficult to exclude given the focally high activity. 2. Other imaging findings of potential clinical significance: Chronic bilateral maxillary sinusitis. Chronic scarring in the left upper lobe. Chronically stable 5 mm left lower lobe nodule is considered benign. Nonobstructive left nephrolithiasis.    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 COMMENT:No significant keratinization is identified. Other basaloid carcinomas are in the differential diagnosis. No cell block material is available for further testing.    09/12/2018 Procedure   He underwent fine Needle Aspiration    10/04/2018 PET scan   1. Significant progression of local recurrence laterally in the mid right neck with an enlarging, increasingly hypermetabolic soft tissue mass. This involves the right sternocleidomastoid muscle. 2. Small lymph nodes in the right axilla are increasingly hypermetabolic. These are nonspecific and potentially reactive, although could reflect a small metastases. Small hypermetabolic nodule in the left suprasternal notch is unchanged. 3. No other evidence of metastatic disease.      10/07/2018 - 12/23/2018 Chemotherapy   The patient had cisplatin plus gemzar    12/07/2018 Imaging   1. Decreased size of lateral right neck mass. 2. Unchanged soft tissue nodule in the suprasternal notch. 3. No evidence of new metastatic  disease in the neck.      05/30/2019 Imaging   CT neck No clear change or progression compared to the study of March. Overall measurements of the right lateral neck mass are similar, approximately 3 x 1.8 cm. See above discussion. One could argue that there is slight increase in lateral bulging, possibly with an increase in contrast enhancement, towards the inferior margin. This is of questionable validity but could possibly represent some progression or inflammatory change. Other findings in the region are stable.   07/20/2019 - 09/15/2019 Chemotherapy   The patient had dexamethasone (DECADRON) 4 MG tablet, 1 of 1 cycle, Start date: --, End date: -- palonosetron (ALOXI) injection 0.25 mg, 0.25 mg, Intravenous,  Once, 3 of 4 cycles Administration: 0.25 mg (07/20/2019), 0.25 mg (08/10/2019), 0.25 mg (09/08/2019) CISplatin (PLATINOL) 84 mg in sodium chloride 0.9 % 250 mL chemo infusion, 40 mg/m2 = 84 mg (80 % of original dose 50 mg/m2), Intravenous,  Once, 3 of 4 cycles Dose modification: 40 mg/m2 (80 % of original dose 50 mg/m2, Cycle 1, Reason: Dose Not Tolerated) Administration: 84 mg (07/20/2019), 84 mg (08/10/2019), 83 mg (09/08/2019) gemcitabine (GEMZAR) 1,600 mg  in sodium chloride 0.9 % 250 mL chemo infusion, 1,672 mg (80 % of original dose 1,000 mg/m2), Intravenous,  Once, 3 of 4 cycles Dose modification: 800 mg/m2 (80 % of original dose 1,000 mg/m2, Cycle 1, Reason: Provider Judgment) Administration: 1,600 mg (07/20/2019), 1,600 mg (07/27/2019), 1,600 mg (08/10/2019), 1,600 mg (08/17/2019), 1,600 mg (09/08/2019), 1,672 mg (09/15/2019) ondansetron (ZOFRAN) 8 mg, dexamethasone (DECADRON) 10 mg in sodium chloride 0.9 % 50 mL IVPB, , Intravenous,  Once, 3 of 4 cycles Administration:  (09/15/2019) fosaprepitant (EMEND) 150 mg, dexamethasone (DECADRON) 12 mg in sodium chloride 0.9 % 145 mL IVPB, , Intravenous,  Once, 3 of 4 cycles Administration:  (07/20/2019),  (08/10/2019),  (09/08/2019)   for  chemotherapy treatment.     10/02/2019 Imaging   CT neck As compared to 05/30/2019, no significant interval change in size of an ill-defined mass within the right lateral neck, again measuring 3.3 x 1.8 cm in transaxial dimensions.   Unchanged mildly enlarged left level I lymph node measuring 1.1 cm in short axis.   Unchanged node or nodule at the thoracic inlet, measuring 1.3 x 0.8 cm.   Please refer to concurrently performed chest CT for a description of findings below the level of the thoracic inlet.     10/02/2019 Imaging   CT chest 1. No new or progressive findings in the chest to suggest metastatic disease. 2. Bilateral subcentimeter solid pulmonary nodules are stable since 2018. 3. Hyperdense 1.1 cm anterior liver focus, not clearly visualized on prior studies. Suggest MRI abdomen without and with IV contrast for further characterization.   10/20/2019 - 12/29/2019 Chemotherapy   The patient had ondansetron (ZOFRAN) injection 8 mg, 8 mg (100 % of original dose 8 mg), Intravenous,  Once, 2 of 5 cycles Dose modification: 8 mg (original dose 8 mg, Cycle 2) Administration: 8 mg (11/17/2019), 8 mg (12/15/2019), 8 mg (12/29/2019) gemcitabine (GEMZAR) 2,000 mg in sodium chloride 0.9 % 250 mL chemo infusion, 2,090 mg, Intravenous,  Once, 3 of 6 cycles Administration: 2,000 mg (10/20/2019), 2,000 mg (11/03/2019), 2,000 mg (11/17/2019), 2,000 mg (12/15/2019), 2,000 mg (12/29/2019)   for chemotherapy treatment.     06/12/2020 Imaging   1. Enlarging superficial, exophytic component of the chronic right sternocleidomastoid muscle mass. See series 6, image 55. 2. Elsewhere stable CT appearance of the Neck.   06/12/2020 Imaging   Post treatment scarring in the left hemithorax, stable. No evidence recurrent or metastatic disease   06/14/2020 - 10/15/2020 Chemotherapy   He received carboplatin, 5FU and Beryle Flock        10/31/2020 Procedure   Interval improvement in right lateral lymph node mass.  Improvement in dermal component as well as invasion of the right sternocleidomastoid muscle.   10 mm submental lymph node slightly enlarged compared to the prior study. Continued follow-up recommended.   11/01/2020 -  Chemotherapy    Patient is on Treatment Plan: HEAD/NECK PEMBROLIZUMAB Q21D         REVIEW OF SYSTEMS:   Constitutional: Denies fevers, chills or abnormal weight loss Eyes: Denies blurriness of vision Ears, nose, mouth, throat, and face: Denies mucositis or sore throat Respiratory: Denies cough, dyspnea or wheezes Cardiovascular: Denies palpitation, chest discomfort or lower extremity swelling Gastrointestinal:  Denies nausea, heartburn or change in bowel habits Skin: Denies abnormal skin rashes Lymphatics: Denies new lymphadenopathy or easy bruising Neurological:Denies numbness, tingling or new weaknesses Behavioral/Psych: Mood is stable, no new changes  All other systems were reviewed with the patient and are negative.  I have reviewed the past medical history, past surgical history, social history and family history with the patient and they are unchanged from previous note.  ALLERGIES:  is allergic to phenergan [promethazine hcl], heparin, and clindamycin.  MEDICATIONS:  Current Outpatient Medications  Medication Sig Dispense Refill   Accu-Chek Softclix Lancets lancets CHECK BLOOD GLUCOSE ONCE A DAY IN THE MORNING. 100 each 12   blood glucose meter kit and supplies KIT Dispense based on patient and insurance preference. Use up to four times daily as directed. (FOR ICD-9 250.00, 250.01). 1 each 1   Blood Glucose Monitoring Suppl (ACCU-CHEK GUIDE) w/Device KIT USE ONCE DAILY AS DIRECTED IN THE MORNING 1 kit 0   glucose blood test strip CHECK BLOOD GLUCOSE ONCE A DAY IN THE MORNING 100 strip 12   levothyroxine (SYNTHROID) 112 MCG tablet Take 1 tablet (112 mcg total) by mouth daily before breakfast. 90 tablet 1   metFORMIN (GLUCOPHAGE) 500 MG tablet TAKE 1 TABLET BY  MOUTH 2 TIMES DAILY WITH A MEAL 60 tablet 2   ondansetron (ZOFRAN) 8 MG tablet Take 1 tablet (8 mg total) by mouth every 8 (eight) hours as needed for nausea or vomiting. 90 tablet 1   oxyCODONE (OXY IR/ROXICODONE) 5 MG immediate release tablet Take 1 tablet (5 mg total) by mouth every 6 (six) hours as needed for severe pain. 90 tablet 0   No current facility-administered medications for this visit.   Facility-Administered Medications Ordered in Other Visits  Medication Dose Route Frequency Provider Last Rate Last Admin   anticoagulant sodium citrate solution 5 mL  5 mL Intracatheter Once Alvy Bimler, Moe Graca, MD       anticoagulant sodium citrate solution 5 mL  5 mL Intracatheter Once Alvy Bimler, Michaelia Beilfuss, MD        PHYSICAL EXAMINATION: ECOG PERFORMANCE STATUS: 1 - Symptomatic but completely ambulatory  Vitals:   05/09/21 0958  BP: 138/84  Pulse: 94  Resp: 18  Temp: 97.7 F (36.5 C)  SpO2: 100%   Filed Weights   05/09/21 0958  Weight: 185 lb 6.4 oz (84.1 kg)    GENERAL:alert, no distress and comfortable SKIN: skin color, texture, turgor are normal, no rashes or significant lesions EYES: normal, Conjunctiva are pink and non-injected, sclera clear OROPHARYNX:no exudate, no erythema and lips, buccal mucosa, and tongue normal  NECK: supple, thyroid normal size, non-tender, without nodularity LYMPH:  no palpable lymphadenopathy in the cervical, axillary or inguinal LUNGS: clear to auscultation and percussion with normal breathing effort HEART: regular rate & rhythm and no murmurs and no lower extremity edema ABDOMEN:abdomen soft, non-tender and normal bowel sounds Musculoskeletal:no cyanosis of digits and no clubbing  NEURO: alert & oriented x 3 with fluent speech, no focal motor/sensory deficits  LABORATORY DATA:  I have reviewed the data as listed    Component Value Date/Time   NA 138 05/09/2021 0933   NA 139 09/22/2017 0829   K 3.9 05/09/2021 0933   K 3.5 09/22/2017 0829   CL 104  05/09/2021 0933   CO2 23 05/09/2021 0933   CO2 26 09/22/2017 0829   GLUCOSE 171 (H) 05/09/2021 0933   GLUCOSE 133 09/22/2017 0829   BUN 11 05/09/2021 0933   BUN 14.1 09/22/2017 0829   CREATININE 0.86 05/09/2021 0933   CREATININE 0.9 09/22/2017 0829   CALCIUM 9.8 05/09/2021 0933   CALCIUM 9.1 09/22/2017 0829   PROT 7.2 05/09/2021 0933   PROT 6.8 09/22/2017 0829   ALBUMIN 4.1 05/09/2021 0933   ALBUMIN 4.1  09/22/2017 0829   AST 19 05/09/2021 0933   AST 22 09/22/2017 0829   ALT 20 05/09/2021 0933   ALT 30 09/22/2017 0829   ALKPHOS 60 05/09/2021 0933   ALKPHOS 55 09/22/2017 0829   BILITOT 0.3 05/09/2021 0933   BILITOT 0.35 09/22/2017 0829   GFRNONAA >60 05/09/2021 0933   GFRAA >60 07/05/2020 0845   GFRAA >60 02/06/2019 1215    No results found for: SPEP, UPEP  Lab Results  Component Value Date   WBC 5.3 05/09/2021   NEUTROABS 3.0 05/09/2021   HGB 14.2 05/09/2021   HCT 42.1 05/09/2021   MCV 86.4 05/09/2021   PLT 205 05/09/2021      Chemistry      Component Value Date/Time   NA 138 05/09/2021 0933   NA 139 09/22/2017 0829   K 3.9 05/09/2021 0933   K 3.5 09/22/2017 0829   CL 104 05/09/2021 0933   CO2 23 05/09/2021 0933   CO2 26 09/22/2017 0829   BUN 11 05/09/2021 0933   BUN 14.1 09/22/2017 0829   CREATININE 0.86 05/09/2021 0933   CREATININE 0.9 09/22/2017 0829      Component Value Date/Time   CALCIUM 9.8 05/09/2021 0933   CALCIUM 9.1 09/22/2017 0829   ALKPHOS 60 05/09/2021 0933   ALKPHOS 55 09/22/2017 0829   AST 19 05/09/2021 0933   AST 22 09/22/2017 0829   ALT 20 05/09/2021 0933   ALT 30 09/22/2017 0829   BILITOT 0.3 05/09/2021 0933   BILITOT 0.35 09/22/2017 0829

## 2021-05-11 NOTE — Assessment & Plan Note (Signed)
He stated he is compliant taking Synthroid TSH is suppressed, will adjust the dose of medicine as needed

## 2021-05-11 NOTE — Assessment & Plan Note (Signed)
He is successful in tapering down to oxycodone dose We will wean him off oxycodone very slowly For now, he will continue at current prescribed dose to use as needed

## 2021-05-11 NOTE — Assessment & Plan Note (Signed)
His last imaging study shows stability I do not plan to repeat imaging study on a routine basis So far, he tolerated treatment very well

## 2021-05-21 ENCOUNTER — Other Ambulatory Visit: Payer: Self-pay | Admitting: *Deleted

## 2021-05-21 ENCOUNTER — Other Ambulatory Visit (HOSPITAL_BASED_OUTPATIENT_CLINIC_OR_DEPARTMENT_OTHER): Payer: Self-pay

## 2021-05-21 NOTE — Telephone Encounter (Signed)
Patient called - LVM requested refill of oxycodone and metformin. Asked to have them sent to [facility]. Called pharmacy - refills available on Metformin - they will refill. Refill request for oxycodone routed to Dr. Alvy Bimler.

## 2021-05-22 ENCOUNTER — Other Ambulatory Visit: Payer: Self-pay | Admitting: Hematology and Oncology

## 2021-05-22 ENCOUNTER — Other Ambulatory Visit (HOSPITAL_BASED_OUTPATIENT_CLINIC_OR_DEPARTMENT_OTHER): Payer: Self-pay

## 2021-05-22 MED ORDER — OXYCODONE HCL 5 MG PO TABS
5.0000 mg | ORAL_TABLET | Freq: Four times a day (QID) | ORAL | 0 refills | Status: DC | PRN
  Filled 2021-05-22 – 2021-05-29 (×2): qty 90, 23d supply, fill #0

## 2021-05-28 ENCOUNTER — Other Ambulatory Visit (HOSPITAL_BASED_OUTPATIENT_CLINIC_OR_DEPARTMENT_OTHER): Payer: Self-pay

## 2021-05-29 ENCOUNTER — Encounter: Payer: Self-pay | Admitting: Hematology and Oncology

## 2021-05-29 ENCOUNTER — Other Ambulatory Visit (HOSPITAL_BASED_OUTPATIENT_CLINIC_OR_DEPARTMENT_OTHER): Payer: Self-pay

## 2021-05-30 ENCOUNTER — Other Ambulatory Visit: Payer: Self-pay

## 2021-05-30 ENCOUNTER — Inpatient Hospital Stay: Payer: Medicaid Other

## 2021-05-30 ENCOUNTER — Encounter: Payer: Self-pay | Admitting: Hematology and Oncology

## 2021-05-30 ENCOUNTER — Inpatient Hospital Stay (HOSPITAL_BASED_OUTPATIENT_CLINIC_OR_DEPARTMENT_OTHER): Payer: Medicaid Other | Admitting: Hematology and Oncology

## 2021-05-30 VITALS — HR 88

## 2021-05-30 DIAGNOSIS — Z95828 Presence of other vascular implants and grafts: Secondary | ICD-10-CM

## 2021-05-30 DIAGNOSIS — C760 Malignant neoplasm of head, face and neck: Secondary | ICD-10-CM | POA: Diagnosis not present

## 2021-05-30 DIAGNOSIS — E119 Type 2 diabetes mellitus without complications: Secondary | ICD-10-CM | POA: Diagnosis not present

## 2021-05-30 DIAGNOSIS — Z7189 Other specified counseling: Secondary | ICD-10-CM

## 2021-05-30 DIAGNOSIS — G893 Neoplasm related pain (acute) (chronic): Secondary | ICD-10-CM | POA: Diagnosis not present

## 2021-05-30 DIAGNOSIS — C7800 Secondary malignant neoplasm of unspecified lung: Secondary | ICD-10-CM

## 2021-05-30 DIAGNOSIS — Z5112 Encounter for antineoplastic immunotherapy: Secondary | ICD-10-CM | POA: Diagnosis not present

## 2021-05-30 LAB — CBC WITH DIFFERENTIAL (CANCER CENTER ONLY)
Abs Immature Granulocytes: 0.01 10*3/uL (ref 0.00–0.07)
Basophils Absolute: 0 10*3/uL (ref 0.0–0.1)
Basophils Relative: 0 %
Eosinophils Absolute: 0.5 10*3/uL (ref 0.0–0.5)
Eosinophils Relative: 7 %
HCT: 42.8 % (ref 39.0–52.0)
Hemoglobin: 14.3 g/dL (ref 13.0–17.0)
Immature Granulocytes: 0 %
Lymphocytes Relative: 32 %
Lymphs Abs: 2.2 10*3/uL (ref 0.7–4.0)
MCH: 29.5 pg (ref 26.0–34.0)
MCHC: 33.4 g/dL (ref 30.0–36.0)
MCV: 88.4 fL (ref 80.0–100.0)
Monocytes Absolute: 0.6 10*3/uL (ref 0.1–1.0)
Monocytes Relative: 9 %
Neutro Abs: 3.7 10*3/uL (ref 1.7–7.7)
Neutrophils Relative %: 52 %
Platelet Count: 234 10*3/uL (ref 150–400)
RBC: 4.84 MIL/uL (ref 4.22–5.81)
RDW: 14.2 % (ref 11.5–15.5)
WBC Count: 7 10*3/uL (ref 4.0–10.5)
nRBC: 0 % (ref 0.0–0.2)

## 2021-05-30 LAB — CMP (CANCER CENTER ONLY)
ALT: 40 U/L (ref 0–44)
AST: 29 U/L (ref 15–41)
Albumin: 4.1 g/dL (ref 3.5–5.0)
Alkaline Phosphatase: 59 U/L (ref 38–126)
Anion gap: 10 (ref 5–15)
BUN: 16 mg/dL (ref 6–20)
CO2: 26 mmol/L (ref 22–32)
Calcium: 9.6 mg/dL (ref 8.9–10.3)
Chloride: 103 mmol/L (ref 98–111)
Creatinine: 0.88 mg/dL (ref 0.61–1.24)
GFR, Estimated: >60 mL/min (ref >60)
Glucose, Bld: 135 mg/dL — ABNORMAL HIGH (ref 70–99)
Potassium: 3.9 mmol/L (ref 3.5–5.1)
Sodium: 139 mmol/L (ref 135–145)
Total Bilirubin: 0.3 mg/dL (ref 0.3–1.2)
Total Protein: 7.3 g/dL (ref 6.5–8.1)

## 2021-05-30 LAB — TSH: TSH: 0.374 u[IU]/mL (ref 0.320–4.118)

## 2021-05-30 MED ORDER — SODIUM CHLORIDE 0.9 % IV SOLN
400.0000 mg | Freq: Once | INTRAVENOUS | Status: AC
  Administered 2021-05-30: 400 mg via INTRAVENOUS
  Filled 2021-05-30: qty 16

## 2021-05-30 MED ORDER — SODIUM CHLORIDE 0.9 % IV SOLN: Freq: Once | INTRAVENOUS | Status: AC

## 2021-05-30 MED ORDER — ONDANSETRON HCL 4 MG/2ML IJ SOLN
8.0000 mg | Freq: Once | INTRAMUSCULAR | Status: AC
  Administered 2021-05-30: 8 mg via INTRAVENOUS
  Filled 2021-05-30: qty 4

## 2021-05-30 MED ORDER — SODIUM CHLORIDE 0.9% FLUSH
10.0000 mL | INTRAVENOUS | Status: DC | PRN
  Administered 2021-05-30: 10 mL

## 2021-05-30 MED ORDER — SODIUM CHLORIDE 0.9% FLUSH
10.0000 mL | Freq: Once | INTRAVENOUS | Status: AC
  Administered 2021-05-30: 10 mL

## 2021-05-30 NOTE — Assessment & Plan Note (Signed)
His last imaging study shows stability I do not plan to repeat imaging study on a routine basis So far, he tolerated treatment very well He has no signs of cancer relapse He will continue pembrolizumab indefinitely and we have agreed to change his dose to every 6 weeks

## 2021-05-30 NOTE — Patient Instructions (Signed)
[facility]   Discharge Instructions: Thank you for choosing [facility] to provide your oncology and hematology care.   If you have a lab appointment with the [facility], please go directly to the [facility] and check in at the registration area.   Wear comfortable clothing and clothing appropriate for easy access to any Portacath or PICC line.   We strive to give you quality time with your provider. You may need to reschedule your appointment if you arrive late (15 or more minutes).  Arriving late affects you and other patients whose appointments are after yours.  Also, if you miss three or more appointments without notifying the office, you may be dismissed from the clinic at the provider's discretion.      For prescription refill requests, have your pharmacy contact our office and allow 72 hours for refills to be completed.    Today you received the following chemotherapy and/or immunotherapy agents: pembrolizumab.       To help prevent nausea and vomiting after your treatment, we encourage you to take your nausea medication as directed.  BELOW ARE SYMPTOMS THAT SHOULD BE REPORTED IMMEDIATELY: *FEVER GREATER THAN 100.4 F (38 C) OR HIGHER *CHILLS OR SWEATING *NAUSEA AND VOMITING THAT IS NOT CONTROLLED WITH YOUR NAUSEA MEDICATION *UNUSUAL SHORTNESS OF BREATH *UNUSUAL BRUISING OR BLEEDING *URINARY PROBLEMS (pain or burning when urinating, or frequent urination) *BOWEL PROBLEMS (unusual diarrhea, constipation, pain near the anus) TENDERNESS IN MOUTH AND THROAT WITH OR WITHOUT PRESENCE OF ULCERS (sore throat, sores in mouth, or a toothache) UNUSUAL RASH, SWELLING OR PAIN  UNUSUAL VAGINAL DISCHARGE OR ITCHING   Items with * indicate a potential emergency and should be followed up as soon as possible or go to the Emergency Department if any problems should occur.  Please show the CHEMOTHERAPY ALERT CARD or IMMUNOTHERAPY ALERT CARD at  check-in to the Emergency Department and triage nurse.  Should you have questions after your visit or need to cancel or reschedule your appointment, please contact [facility]  Dept: [phone]  and follow the prompts.  Office hours are 8:00 a.m. to 4:30 p.m. Monday - Friday. Please note that voicemails left after 4:00 p.m. may not be returned until the following business day.  We are closed weekends and major holidays. You have access to a nurse at all times for urgent questions. Please call the main number to the clinic Dept: [phone] and follow the prompts.   For any non-urgent questions, you may also contact your provider using MyChart. We now offer e-Visits for anyone 79 and older to request care online for non-urgent symptoms. For details visit [email or web address].   Also download the MyChart app! Go to the app store, search "MyChart", open the app, select [facility], and log in with your MyChart username and password.  Due to Covid, a mask is required upon entering the hospital/clinic. If you do not have a mask, one will be given to you upon arrival. For doctor visits, patients may have 1 support person aged 2 or older with them. For treatment visits, patients cannot have anyone with them due to current Covid guidelines and our immunocompromised population.

## 2021-05-30 NOTE — Progress Notes (Signed)
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[facility]) His last imaging study shows stability I do not plan to repeat imaging study on a routine basis So far, he tolerated treatment very well He has no signs of cancer relapse He will continue pembrolizumab indefinitely and we have agreed to change his dose to every 6 weeks  Cancer associated pain He is successful in tapering down to oxycodone dose We will wean him off oxycodone very slowly He is able to reduce oxycodone to 5 mg once or twice a day  Type 2 diabetes mellitus ([place]) He has not been checking his blood sugar consistently We discussed importance of risk factor modification with dietary modification and exercise  No orders of the defined types were placed in this encounter.   All questions were answered. The patient knows to call the clinic with any problems, questions or concerns. The total time spent in the appointment was 20 minutes encounter with patients including review of chart and various tests results, discussions about plan of care and coordination of care plan   [name], MD 05/30/2021 12:06 PM  INTERVAL HISTORY: Please see below for problem oriented charting. 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 Dates of therapy are approximates only due to poor records     Malignant neoplasm of head and neck (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 Pan-endoscopy elsewhere showed cancer from nasopharyngeal space.   01/04/2007 - 02/20/2007 Chemotherapy   He received 2 cycles of cisplatin and 5FU followed by concurrent chemo with weekly cisplatin and radiation. He only received 2 doses of chemo due to severe mucositis, nausea and weight loss.   04/05/2007 - 08/04/2007 Chemotherapy   He received 4 more courses of cisplatin with 5FU and had complete response   07/05/2009 Procedure   Fine-needle aspirate of the right level II lymph nodes come from recurrent metastatic disease. Repeat endoscopy and CT scan show no evidence of disease elsewhere.   07/08/2009 - 12/02/2009 Chemotherapy   He was given 6 cycles of carboplatin, 5-FU and docetaxel   12/03/2009 Surgery   He has surgery to the residual lymph node on the right neck which showed no evidence of disease.   02/22/2012 Imaging   Repeat imaging study showed large recurrent mass. He was referred elsewhere for further treatment.   05/03/2012 Surgery   He underwent left upper lobectomy.   04/29/2013 Imaging   PEt scan showed lesion on right level II B  and lower lung was abnormal   06/03/2013 - 02/02/2014 Chemotherapy   He had 6 cycles of chemotherapy when he was found to have recurrence of cancer and had received oxaliplatin and capecitabine   06/07/2014 Imaging   PET CT scan showed persistent disease in the right neck lymph nodes and left lung   06/29/2014 Procedure   Accession: [id number]  repeat LUL biopsy confirmed metastatic cancer   07/18/2014 - 07/31/2014 Radiation Therapy   He received palliative radiation therapy to the lungs   10/10/2014 Imaging   CT scan of the chest, abdomen and pelvis show regression in the size of the lung nodule in the left upper lobe and stable pulmonary nodules   01/24/2015 Imaging   CT scan showed stable disease in neck and lung   06/19/2015 Imaging   CT scan of the neck and the chest show possible mild progression of the nodule in the right side of the neck.   06/25/2015 Imaging   PET scan confirmed disease recurrence in the neck   07/07/2015 Imaging   He had MRI neck at [facility]   09/03/2015 - 08/26/2018 Chemotherapy   He received palliative chemo with Nivolumab   10/29/2015 Imaging   PET CT showed positive response to Rx   02/28/2016 Imaging   Ct abdomen showed abnormal thinkening in his stomach   03/03/2016 Imaging   CT: Right sternocleidomastoid muscle metastasis appears less distinct but otherwise not significantly changed in size or configuration since 06/19/2015.2. Left level 3 lymph node which was hypermetabolic by PET-CT in January 2017 appears slightly smaller   04/01/2016 Imaging   CT cervical spine showed no acute fracture or traumatic malalignment in the cervical spine   04/22/2016 Procedure   Port-a-cath placed.   06/16/2016 Imaging   Ct neck showed right sternocleidomastoid muscle metastasis is further decreased in conspicuity since May, and has mildly decreased in size since September 2016. Continued stability of sub-centimeter left cervical lymph nodes. No new or progressive metastatic disease in the neck.   06/16/2016 Imaging   CT chest showed stable masslike radiation fibrosis in the left upper lobe. Stable subcentimeter pulmonary nodules in the bilateral lower lobes. No new or progressive metastatic disease in the chest. Nonobstructing left renal stone.   10/13/2016 Imaging   Ct neck showed unchanged right  sternocleidomastoid muscle metastasis. Unchanged subcentimeter left cervical lymph nodes. No evidence of new or progressive metastatic disease in the neck.   10/13/2016 Imaging   CT chest showed tiny hypervascular foci in the liver, not definitely seen on prior imaging of 06/16/2016 and 02/28/2016. Abdomen MRI without and with contrast recommended to further evaluate as metastatic disease is a concern. 2. Stable appearance of post treatment changes left upper lung and scattered tiny bilateral pulmonary nodules.   02/11/2017 Imaging   Ct neck: Lymph node mass right posterior neck appears improved from the prior study. Small posterior lymph nodes on the left unchanged. Occluded right jugular vein unchanged.   02/11/2017 Imaging   1. Similar appearance of postsurgical and radiation changes in the left upper lobe. 2. Similar bilateral pulmonary nodules. 3. No thoracic adenopathy. 4. Subtle foci of post-contrast enhancement within the liver are suboptimally characterized on this nondedicated study. Likely similar. These could either be re-evaluated at followup or more entirely characterized with abdominal MRI. 5. Left nephrolithiasis.   05/19/2017 Imaging   Matted lymph node mass right posterior neck appears larger in the recent CT. Accurate measurements difficult due to infiltrating tumor margins and infiltration of the   muscle. Right jugular vein again appears occluded or resected. Small left posterior lymph nodes stable. Left upper lobe airspace density stable and similar to the prior CT   06/03/2017 PET scan   1. Hypermetabolic ill-defined right level IIb lymph node, about 1.3 cm in diameter with maximum SUV 9.5 (formerly 8.1). Appearance suspicious for residual/recurrent malignancy. No worrisome left-sided lesion. 2. Left suprahilar indistinct opacity demonstrates no worrisome hypermetabolic activity. The 5 mm left lower lobe pulmonary nodule is stable and not currently hypermetabolic although below  sensitive PET-CT size thresholds. 3. Other imaging findings of potential clinical significance: Bilateral nonobstructive nephrolithiasis. Chronic bilateral maxillary sinusitis.   05/11/2018 PET scan   1. Continued chronic accentuated metabolic activity in the vicinity of right level IIB and the adjacent right sternocleidomastoid muscle, with ill definition of surrounding tissue planes. Maximum SUV is currently 8.1, formerly 9.5. Accentuated metabolic activity is been present in this vicinity back through 06/25/2015, and there was also some low-level activity in this vicinity on 06/07/2014. Some of this may be from scarring and local muscular activity although clearly a component of residual tumor is difficult to exclude given the focally high activity. 2. Other imaging findings of potential clinical significance: Chronic bilateral maxillary sinusitis. Chronic scarring in the left upper lobe. Chronically stable 5 mm left lower lobe nodule is considered benign. Nonobstructive left nephrolithiasis.   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 COMMENT:No significant keratinization is identified. Other basaloid carcinomas are in the differential diagnosis. No cell block material is available for further testing.   09/12/2018 Procedure   He underwent fine Needle Aspiration   10/04/2018 PET scan   1. Significant progression of local recurrence laterally in the mid right neck with an enlarging, increasingly hypermetabolic soft tissue mass. This involves the right sternocleidomastoid muscle. 2. Small lymph nodes in the right axilla are increasingly hypermetabolic. These are nonspecific and potentially reactive, although could reflect a small metastases. Small hypermetabolic nodule in the left suprasternal notch is unchanged. 3. No other evidence of metastatic  disease.     10/07/2018 - 12/23/2018 Chemotherapy   The patient had cisplatin plus gemzar   12/07/2018 Imaging   1. Decreased size of lateral right neck mass. 2. Unchanged soft tissue nodule in the suprasternal notch. 3. No evidence of new metastatic disease in the neck.     05/30/2019 Imaging   CT neck No clear change or progression compared to the study of March. Overall measurements of the right lateral neck mass are similar, approximately 3 x 1.8 cm. See above discussion. One could argue that there is slight increase in lateral bulging, possibly with an increase in contrast enhancement, towards the inferior margin. This is of questionable validity but could possibly represent some progression or inflammatory change. Other findings in the region are stable.   07/20/2019 - 09/15/2019 Chemotherapy   The patient had dexamethasone (DECADRON) 4 MG tablet, 1 of 1 cycle, Start date: --, End date: -- palonosetron (ALOXI) injection 0.25 mg, 0.25 mg, Intravenous,  Once, 3 of 4 cycles Administration: 0.25 mg (07/20/2019), 0.25 mg (08/10/2019), 0.25 mg (09/08/2019) CISplatin (PLATINOL) 84 mg in sodium chloride 0.9 % 250 mL chemo infusion, 40 mg/m2 = 84 mg (80 % of original dose 50 mg/m2), Intravenous,  Once, 3 of 4 cycles Dose modification: 40 mg/m2 (80 % of original dose 50 mg/m2, Cycle 1, Reason: Dose   Not Tolerated) Administration: 84 mg (07/20/2019), 84 mg (08/10/2019), 83 mg (09/08/2019) gemcitabine (GEMZAR) 1,600 mg in sodium chloride 0.9 % 250 mL chemo infusion, 1,672 mg (80 % of original dose 1,000 mg/m2), Intravenous,  Once, 3 of 4 cycles Dose modification: 800 mg/m2 (80 % of original dose 1,000 mg/m2, Cycle 1, Reason: Provider Judgment) Administration: 1,600 mg (07/20/2019), 1,600 mg (07/27/2019), 1,600 mg (08/10/2019), 1,600 mg (08/17/2019), 1,600 mg (09/08/2019), 1,672 mg (09/15/2019) ondansetron (ZOFRAN) 8 mg, dexamethasone (DECADRON) 10 mg in sodium chloride 0.9 % 50 mL IVPB, , Intravenous,  Once,  3 of 4 cycles Administration:  (09/15/2019) fosaprepitant (EMEND) 150 mg, dexamethasone (DECADRON) 12 mg in sodium chloride 0.9 % 145 mL IVPB, , Intravenous,  Once, 3 of 4 cycles Administration:  (07/20/2019),  (08/10/2019),  (09/08/2019)   for chemotherapy treatment.     10/02/2019 Imaging   CT neck As compared to 05/30/2019, no significant interval change in size of an ill-defined mass within the right lateral neck, again measuring 3.3 x 1.8 cm in transaxial dimensions.   Unchanged mildly enlarged left level I lymph node measuring 1.1 cm in short axis.   Unchanged node or nodule at the thoracic inlet, measuring 1.3 x 0.8 cm.   Please refer to concurrently performed chest CT for a description of findings below the level of the thoracic inlet.     10/02/2019 Imaging   CT chest 1. No new or progressive findings in the chest to suggest metastatic disease. 2. Bilateral subcentimeter solid pulmonary nodules are stable since 2018. 3. Hyperdense 1.1 cm anterior liver focus, not clearly visualized on prior studies. Suggest MRI abdomen without and with IV contrast for further characterization.   10/20/2019 - 12/29/2019 Chemotherapy   The patient had ondansetron (ZOFRAN) injection 8 mg, 8 mg (100 % of original dose 8 mg), Intravenous,  Once, 2 of 5 cycles Dose modification: 8 mg (original dose 8 mg, Cycle 2) Administration: 8 mg (11/17/2019), 8 mg (12/15/2019), 8 mg (12/29/2019) gemcitabine (GEMZAR) 2,000 mg in sodium chloride 0.9 % 250 mL chemo infusion, 2,090 mg, Intravenous,  Once, 3 of 6 cycles Administration: 2,000 mg (10/20/2019), 2,000 mg (11/03/2019), 2,000 mg (11/17/2019), 2,000 mg (12/15/2019), 2,000 mg (12/29/2019)   for chemotherapy treatment.     06/12/2020 Imaging   1. Enlarging superficial, exophytic component of the chronic right sternocleidomastoid muscle mass. See series 6, image 55. 2. Elsewhere stable CT appearance of the Neck.   06/12/2020 Imaging   Post treatment scarring in the  left hemithorax, stable. No evidence recurrent or metastatic disease   06/14/2020 - 10/15/2020 Chemotherapy   He received carboplatin, 5FU and keytruda        10/31/2020 Procedure   Interval improvement in right lateral lymph node mass. Improvement in dermal component as well as invasion of the right sternocleidomastoid muscle.   10 mm submental lymph node slightly enlarged compared to the prior study. Continued follow-up recommended.   11/01/2020 -  Chemotherapy    Patient is on Treatment Plan: HEAD/NECK PEMBROLIZUMAB Q21D         REVIEW OF SYSTEMS:   Constitutional: Denies fevers, chills or abnormal weight loss Eyes: Denies blurriness of vision Ears, nose, mouth, throat, and face: Denies mucositis or sore throat Respiratory: Denies cough, dyspnea or wheezes Cardiovascular: Denies palpitation, chest discomfort or lower extremity swelling Gastrointestinal:  Denies nausea, heartburn or change in bowel habits Skin: Denies abnormal skin rashes Lymphatics: Denies new lymphadenopathy or easy bruising Neurological:Denies numbness, tingling or new weaknesses Behavioral/Psych: Mood is stable,   no new changes  All other systems were reviewed with the patient and are negative.  I have reviewed the past medical history, past surgical history, social history and family history with the patient and they are unchanged from previous note.  ALLERGIES:  is allergic to phenergan [promethazine hcl], heparin, and clindamycin.  MEDICATIONS:  Current Outpatient Medications  Medication Sig Dispense Refill   Accu-Chek Softclix Lancets lancets CHECK BLOOD GLUCOSE ONCE A DAY IN THE MORNING. 100 each 12   blood glucose meter kit and supplies KIT Dispense based on patient and insurance preference. Use up to four times daily as directed. (FOR ICD-9 250.00, 250.01). 1 each 1   Blood Glucose Monitoring Suppl (ACCU-CHEK GUIDE) w/Device KIT USE ONCE DAILY AS DIRECTED IN THE MORNING 1 kit 0   glucose blood  test strip CHECK BLOOD GLUCOSE ONCE A DAY IN THE MORNING 100 strip 12   levothyroxine (SYNTHROID) 112 MCG tablet Take 1 tablet (112 mcg total) by mouth daily before breakfast. 90 tablet 1   metFORMIN (GLUCOPHAGE) 500 MG tablet TAKE 1 TABLET BY MOUTH 2 TIMES DAILY WITH A MEAL 60 tablet 2   ondansetron (ZOFRAN) 8 MG tablet Take 1 tablet (8 mg total) by mouth every 8 (eight) hours as needed for nausea or vomiting. 90 tablet 1   oxyCODONE (OXY IR/ROXICODONE) 5 MG immediate release tablet Take 1 tablet (5 mg total) by mouth every 6 (six) hours as needed for severe pain. 90 tablet 0   No current facility-administered medications for this visit.   Facility-Administered Medications Ordered in Other Visits  Medication Dose Route Frequency Provider Last Rate Last Admin   0.9 %  sodium chloride infusion   Intravenous Once Alvy Bimler, Danamarie Minami, MD       anticoagulant sodium citrate solution 5 mL  5 mL Intracatheter Once Alvy Bimler, Jorgen Wolfinger, MD       anticoagulant sodium citrate solution 5 mL  5 mL Intracatheter Once Alvy Bimler, Duaine Radin, MD       ondansetron (ZOFRAN) injection 8 mg  8 mg Intravenous Once Alvy Bimler, Jera Headings, MD       pembrolizumab (KEYTRUDA) 400 mg in sodium chloride 0.9 % 50 mL chemo infusion  400 mg Intravenous Once Alvy Bimler, Preslie Depasquale, MD        PHYSICAL EXAMINATION: ECOG PERFORMANCE STATUS: 1 - Symptomatic but completely ambulatory  Vitals:   05/30/21 1142  BP: 116/76  Pulse: (!) 102  Resp: 18  Temp: 98 F (36.7 C)  SpO2: 100%   Filed Weights   05/30/21 1142  Weight: 183 lb 3.2 oz (83.1 kg)    GENERAL:alert, no distress and comfortable SKIN: skin color, texture, turgor are normal, no rashes or significant lesions EYES: normal, Conjunctiva are pink and non-injected, sclera clear OROPHARYNX:no exudate, no erythema and lips, buccal mucosa, and tongue normal  NECK: supple, thyroid normal size, non-tender, without nodularity LYMPH:  no palpable lymphadenopathy in the cervical, axillary or inguinal LUNGS: clear to  auscultation and percussion with normal breathing effort HEART: regular rate & rhythm and no murmurs and no lower extremity edema ABDOMEN:abdomen soft, non-tender and normal bowel sounds Musculoskeletal:no cyanosis of digits and no clubbing  NEURO: alert & oriented x 3 with fluent speech, no focal motor/sensory deficits  LABORATORY DATA:  I have reviewed the data as listed    Component Value Date/Time   NA 139 05/30/2021 1114   NA 139 09/22/2017 0829   K 3.9 05/30/2021 1114   K 3.5 09/22/2017 0829   CL 103 05/30/2021 1114  CO2 26 05/30/2021 1114   CO2 26 09/22/2017 0829   GLUCOSE 135 (H) 05/30/2021 1114   GLUCOSE 133 09/22/2017 0829   BUN 16 05/30/2021 1114   BUN 14.1 09/22/2017 0829   CREATININE 0.88 05/30/2021 1114   CREATININE 0.9 09/22/2017 0829   CALCIUM 9.6 05/30/2021 1114   CALCIUM 9.1 09/22/2017 0829   PROT 7.3 05/30/2021 1114   PROT 6.8 09/22/2017 0829   ALBUMIN 4.1 05/30/2021 1114   ALBUMIN 4.1 09/22/2017 0829   AST 29 05/30/2021 1114   AST 22 09/22/2017 0829   ALT 40 05/30/2021 1114   ALT 30 09/22/2017 0829   ALKPHOS 59 05/30/2021 1114   ALKPHOS 55 09/22/2017 0829   BILITOT 0.3 05/30/2021 1114   BILITOT 0.35 09/22/2017 0829   GFRNONAA >60 05/30/2021 1114   GFRAA >60 07/05/2020 0845   GFRAA >60 02/06/2019 1215    No results found for: SPEP, UPEP  Lab Results  Component Value Date   WBC 7.0 05/30/2021   NEUTROABS 3.7 05/30/2021   HGB 14.3 05/30/2021   HCT 42.8 05/30/2021   MCV 88.4 05/30/2021   PLT 234 05/30/2021      Chemistry      Component Value Date/Time   NA 139 05/30/2021 1114   NA 139 09/22/2017 0829   K 3.9 05/30/2021 1114   K 3.5 09/22/2017 0829   CL 103 05/30/2021 1114   CO2 26 05/30/2021 1114   CO2 26 09/22/2017 0829   BUN 16 05/30/2021 1114   BUN 14.1 09/22/2017 0829   CREATININE 0.88 05/30/2021 1114   CREATININE 0.9 09/22/2017 0829      Component Value Date/Time   CALCIUM 9.6 05/30/2021 1114   CALCIUM 9.1 09/22/2017 0829    ALKPHOS 59 05/30/2021 1114   ALKPHOS 55 09/22/2017 0829   AST 29 05/30/2021 1114   AST 22 09/22/2017 0829   ALT 40 05/30/2021 1114   ALT 30 09/22/2017 0829   BILITOT 0.3 05/30/2021 1114   BILITOT 0.35 09/22/2017 0829

## 2021-05-30 NOTE — Assessment & Plan Note (Signed)
He has not been checking his blood sugar consistently We discussed importance of risk factor modification with dietary modification and exercise

## 2021-05-30 NOTE — Assessment & Plan Note (Signed)
He is successful in tapering down to oxycodone dose We will wean him off oxycodone very slowly He is able to reduce oxycodone to 5 mg once or twice a day

## 2021-05-31 LAB — T4: T4, Total: 7 ug/dL (ref 4.5–12.0)

## 2021-06-26 ENCOUNTER — Telehealth: Payer: Self-pay | Admitting: *Deleted

## 2021-06-26 ENCOUNTER — Other Ambulatory Visit: Payer: Self-pay | Admitting: Oncology

## 2021-06-26 ENCOUNTER — Other Ambulatory Visit (HOSPITAL_COMMUNITY): Payer: Self-pay

## 2021-06-26 MED ORDER — OXYCODONE HCL 5 MG PO TABS
5.0000 mg | ORAL_TABLET | Freq: Four times a day (QID) | ORAL | 0 refills | Status: DC | PRN
  Filled 2021-06-26: qty 90, 23d supply, fill #0

## 2021-06-26 NOTE — Telephone Encounter (Signed)
Requested refill of oxycodone 5 mg to be sent to [facility].  Dr. Alvy Bimler out of office today. Refill request given to Dr. Jana Hakim.

## 2021-06-27 ENCOUNTER — Other Ambulatory Visit (HOSPITAL_BASED_OUTPATIENT_CLINIC_OR_DEPARTMENT_OTHER): Payer: Self-pay

## 2021-06-27 ENCOUNTER — Other Ambulatory Visit (HOSPITAL_COMMUNITY): Payer: Self-pay

## 2021-06-28 ENCOUNTER — Other Ambulatory Visit (HOSPITAL_COMMUNITY): Payer: Self-pay

## 2021-07-08 ENCOUNTER — Telehealth: Payer: Self-pay

## 2021-07-08 NOTE — Telephone Encounter (Signed)
Returned his call. On Saturday he started not feeling well. Yesterday he did a home covid test and it was negative. He has fever, nausea, cough, muscle aches and tiredness. Everyone in his household has it now.   He is asking should he keep Fridays appts? What do you recommend?

## 2021-07-08 NOTE — Telephone Encounter (Signed)
Called and given below message. He verbalized understanding. Appts canceled for 10/7 and scheduled message sent.

## 2021-07-08 NOTE — Telephone Encounter (Signed)
I suggest he takes the week and next off, reschedule in 2 weeks Please send LOS

## 2021-07-09 ENCOUNTER — Telehealth: Payer: Self-pay | Admitting: Hematology and Oncology

## 2021-07-09 NOTE — Telephone Encounter (Signed)
Scheduled per sch msg. Called and spoke with patient. Confirmed appt  

## 2021-07-11 ENCOUNTER — Ambulatory Visit: Payer: Medicaid Other | Admitting: Hematology and Oncology

## 2021-07-11 ENCOUNTER — Ambulatory Visit: Payer: Medicaid Other

## 2021-07-11 ENCOUNTER — Other Ambulatory Visit: Payer: Medicaid Other

## 2021-07-21 ENCOUNTER — Telehealth: Payer: Self-pay

## 2021-07-21 ENCOUNTER — Other Ambulatory Visit: Payer: Self-pay | Admitting: Hematology and Oncology

## 2021-07-21 ENCOUNTER — Other Ambulatory Visit: Payer: Self-pay

## 2021-07-21 ENCOUNTER — Other Ambulatory Visit (HOSPITAL_BASED_OUTPATIENT_CLINIC_OR_DEPARTMENT_OTHER): Payer: Self-pay

## 2021-07-21 MED ORDER — OXYCODONE HCL 5 MG PO TABS
5.0000 mg | ORAL_TABLET | Freq: Four times a day (QID) | ORAL | 0 refills | Status: DC | PRN
  Filled 2021-07-21: qty 90, 23d supply, fill #0

## 2021-07-21 MED ORDER — CETIRIZINE HCL 10 MG PO TABS
10.0000 mg | ORAL_TABLET | Freq: Every day | ORAL | 0 refills | Status: DC | PRN
  Filled 2021-07-21: qty 100, 100d supply, fill #0

## 2021-07-21 MED ORDER — METFORMIN HCL 500 MG PO TABS
500.0000 mg | ORAL_TABLET | Freq: Two times a day (BID) | ORAL | 2 refills | Status: DC
  Filled 2021-07-21: qty 60, 30d supply, fill #0
  Filled 2021-09-09: qty 60, 30d supply, fill #1

## 2021-07-21 NOTE — Telephone Encounter (Signed)
He called and left a message. Requesting refill on Oxycodone and Metformin. He ask that Rx be sent to [facility] pharmacy.

## 2021-07-21 NOTE — Telephone Encounter (Signed)
done

## 2021-07-21 NOTE — Telephone Encounter (Signed)
Called and given below message. He verbalized understanding. Requesting Cetirizine Rx sent to pharmacy for prn use. Rx sent.

## 2021-07-23 ENCOUNTER — Other Ambulatory Visit (HOSPITAL_BASED_OUTPATIENT_CLINIC_OR_DEPARTMENT_OTHER): Payer: Self-pay

## 2021-07-25 ENCOUNTER — Inpatient Hospital Stay: Payer: Medicaid Other | Attending: Hematology and Oncology | Admitting: Hematology and Oncology

## 2021-07-25 ENCOUNTER — Inpatient Hospital Stay: Payer: Medicaid Other

## 2021-07-25 ENCOUNTER — Other Ambulatory Visit: Payer: Self-pay

## 2021-07-25 ENCOUNTER — Encounter: Payer: Self-pay | Admitting: Hematology and Oncology

## 2021-07-25 DIAGNOSIS — C119 Malignant neoplasm of nasopharynx, unspecified: Secondary | ICD-10-CM | POA: Diagnosis present

## 2021-07-25 DIAGNOSIS — E039 Hypothyroidism, unspecified: Secondary | ICD-10-CM | POA: Diagnosis not present

## 2021-07-25 DIAGNOSIS — C760 Malignant neoplasm of head, face and neck: Secondary | ICD-10-CM

## 2021-07-25 DIAGNOSIS — Z5112 Encounter for antineoplastic immunotherapy: Secondary | ICD-10-CM | POA: Insufficient documentation

## 2021-07-25 DIAGNOSIS — G893 Neoplasm related pain (acute) (chronic): Secondary | ICD-10-CM

## 2021-07-25 DIAGNOSIS — Z95828 Presence of other vascular implants and grafts: Secondary | ICD-10-CM

## 2021-07-25 DIAGNOSIS — C7800 Secondary malignant neoplasm of unspecified lung: Secondary | ICD-10-CM

## 2021-07-25 DIAGNOSIS — Z79899 Other long term (current) drug therapy: Secondary | ICD-10-CM | POA: Diagnosis not present

## 2021-07-25 DIAGNOSIS — Z7189 Other specified counseling: Secondary | ICD-10-CM

## 2021-07-25 LAB — CBC WITH DIFFERENTIAL (CANCER CENTER ONLY)
Abs Immature Granulocytes: 0.01 10*3/uL (ref 0.00–0.07)
Basophils Absolute: 0 10*3/uL (ref 0.0–0.1)
Basophils Relative: 1 %
Eosinophils Absolute: 0.2 10*3/uL (ref 0.0–0.5)
Eosinophils Relative: 4 %
HCT: 39.9 % (ref 39.0–52.0)
Hemoglobin: 13.7 g/dL (ref 13.0–17.0)
Immature Granulocytes: 0 %
Lymphocytes Relative: 30 %
Lymphs Abs: 2 10*3/uL (ref 0.7–4.0)
MCH: 29.5 pg (ref 26.0–34.0)
MCHC: 34.3 g/dL (ref 30.0–36.0)
MCV: 85.8 fL (ref 80.0–100.0)
Monocytes Absolute: 0.5 10*3/uL (ref 0.1–1.0)
Monocytes Relative: 8 %
Neutro Abs: 3.7 10*3/uL (ref 1.7–7.7)
Neutrophils Relative %: 57 %
Platelet Count: 230 10*3/uL (ref 150–400)
RBC: 4.65 MIL/uL (ref 4.22–5.81)
RDW: 14 % (ref 11.5–15.5)
WBC Count: 6.5 10*3/uL (ref 4.0–10.5)
nRBC: 0 % (ref 0.0–0.2)

## 2021-07-25 LAB — CMP (CANCER CENTER ONLY)
ALT: 33 U/L (ref 0–44)
AST: 28 U/L (ref 15–41)
Albumin: 4 g/dL (ref 3.5–5.0)
Alkaline Phosphatase: 60 U/L (ref 38–126)
Anion gap: 7 (ref 5–15)
BUN: 14 mg/dL (ref 6–20)
CO2: 28 mmol/L (ref 22–32)
Calcium: 9 mg/dL (ref 8.9–10.3)
Chloride: 103 mmol/L (ref 98–111)
Creatinine: 0.93 mg/dL (ref 0.61–1.24)
GFR, Estimated: >60 mL/min (ref >60)
Glucose, Bld: 136 mg/dL — ABNORMAL HIGH (ref 70–99)
Potassium: 3.8 mmol/L (ref 3.5–5.1)
Sodium: 138 mmol/L (ref 135–145)
Total Bilirubin: 0.4 mg/dL (ref 0.3–1.2)
Total Protein: 7.5 g/dL (ref 6.5–8.1)

## 2021-07-25 LAB — TSH: TSH: 5.101 u[IU]/mL — ABNORMAL HIGH (ref 0.350–4.500)

## 2021-07-25 MED ORDER — SODIUM CHLORIDE 0.9 % IV SOLN: Freq: Once | INTRAVENOUS | Status: AC

## 2021-07-25 MED ORDER — SODIUM CHLORIDE 0.9% FLUSH
10.0000 mL | Freq: Once | INTRAVENOUS | Status: AC
  Administered 2021-07-25: 10 mL

## 2021-07-25 MED ORDER — SODIUM CHLORIDE 0.9 % IV SOLN
400.0000 mg | Freq: Once | INTRAVENOUS | Status: AC
  Administered 2021-07-25: 400 mg via INTRAVENOUS
  Filled 2021-07-25: qty 16

## 2021-07-25 MED ORDER — ONDANSETRON HCL 4 MG/2ML IJ SOLN
8.0000 mg | Freq: Once | INTRAMUSCULAR | Status: AC
  Administered 2021-07-25: 8 mg via INTRAVENOUS
  Filled 2021-07-25: qty 4

## 2021-07-25 MED ORDER — ONDANSETRON HCL 4 MG/2ML IJ SOLN
INTRAMUSCULAR | Status: AC
  Filled 2021-07-25: qty 2

## 2021-07-25 MED ORDER — OXYCODONE HCL 5 MG PO TABS: 5.0000 mg | ORAL_TABLET | Freq: Three times a day (TID) | ORAL | 0 refills | Status: DC | PRN

## 2021-07-25 MED ORDER — ANTICOAGULANT SODIUM CITRATE LOCK FLUSH 4% (120 MG/3ML) IV SOLN
5.0000 mL | PREFILLED_SYRINGE | Freq: Once | INTRAVENOUS | Status: AC
  Administered 2021-07-25: 5 mL
  Filled 2021-07-25: qty 6

## 2021-07-25 MED ORDER — SODIUM CHLORIDE 0.9% FLUSH
10.0000 mL | INTRAVENOUS | Status: DC | PRN
  Administered 2021-07-25: 10 mL

## 2021-07-25 NOTE — Patient Instructions (Signed)
[facility]   Discharge Instructions: Thank you for choosing [facility] to provide your oncology and hematology care.   If you have a lab appointment with the [facility], please go directly to the [facility] and check in at the registration area.   Wear comfortable clothing and clothing appropriate for easy access to any Portacath or PICC line.   We strive to give you quality time with your provider. You may need to reschedule your appointment if you arrive late (15 or more minutes).  Arriving late affects you and other patients whose appointments are after yours.  Also, if you miss three or more appointments without notifying the office, you may be dismissed from the clinic at the provider's discretion.      For prescription refill requests, have your pharmacy contact our office and allow 72 hours for refills to be completed.    Today you received the following chemotherapy and/or immunotherapy agents: pembrolizumab.      To help prevent nausea and vomiting after your treatment, we encourage you to take your nausea medication as directed.  BELOW ARE SYMPTOMS THAT SHOULD BE REPORTED IMMEDIATELY: *FEVER GREATER THAN 100.4 F (38 C) OR HIGHER *CHILLS OR SWEATING *NAUSEA AND VOMITING THAT IS NOT CONTROLLED WITH YOUR NAUSEA MEDICATION *UNUSUAL SHORTNESS OF BREATH *UNUSUAL BRUISING OR BLEEDING *URINARY PROBLEMS (pain or burning when urinating, or frequent urination) *BOWEL PROBLEMS (unusual diarrhea, constipation, pain near the anus) TENDERNESS IN MOUTH AND THROAT WITH OR WITHOUT PRESENCE OF ULCERS (sore throat, sores in mouth, or a toothache) UNUSUAL RASH, SWELLING OR PAIN  UNUSUAL VAGINAL DISCHARGE OR ITCHING   Items with * indicate a potential emergency and should be followed up as soon as possible or go to the Emergency Department if any problems should occur.  Please show the CHEMOTHERAPY ALERT CARD or IMMUNOTHERAPY ALERT CARD at  check-in to the Emergency Department and triage nurse.  Should you have questions after your visit or need to cancel or reschedule your appointment, please contact [facility]  Dept: [phone]  and follow the prompts.  Office hours are 8:00 a.m. to 4:30 p.m. Monday - Friday. Please note that voicemails left after 4:00 p.m. may not be returned until the following business day.  We are closed weekends and major holidays. You have access to a nurse at all times for urgent questions. Please call the main number to the clinic Dept: [phone] and follow the prompts.   For any non-urgent questions, you may also contact your provider using MyChart. We now offer e-Visits for anyone 55 and older to request care online for non-urgent symptoms. For details visit [email or web address].   Also download the MyChart app! Go to the app store, search "MyChart", open the app, select [facility], and log in with your MyChart username and password.  Due to Covid, a mask is required upon entering the hospital/clinic. If you do not have a mask, one will be given to you upon arrival. For doctor visits, patients may have 1 support person aged 30 or older with them. For treatment visits, patients cannot have anyone with them due to current Covid guidelines and our immunocompromised population.

## 2021-07-25 NOTE — Assessment & Plan Note (Signed)
His last imaging study shows stability I do not plan to repeat imaging study on a routine basis So far, he tolerated treatment very well He has no signs of cancer relapse He will continue pembrolizumab indefinitely and we have agreed to change his dose to every 6 weeks

## 2021-07-25 NOTE — Progress Notes (Signed)
[facility] OFFICE PROGRESS NOTE  Patient Care Team: Patient, No Pcp Per (Inactive) as PCP - General (General Practice) [name], MD as Consulting Physician (Hematology and Oncology) Eppie Gibson, MD as Attending Physician (Radiation Oncology) Jodi Marble, MD as Consulting Physician (Otolaryngology) Philomena Doheny, MD as Referring Physician (Plastic Surgery) Irene Shipper, MD as Consulting Physician (Gastroenterology) System, Provider Not In  ASSESSMENT & PLAN:  Malignant neoplasm of head and neck [facility]) His last imaging study shows stability I do not plan to repeat imaging study on a routine basis So far, he tolerated treatment very well He has no signs of cancer relapse He will continue pembrolizumab indefinitely and we have agreed to change his dose to every 6 weeks  Cancer associated pain He is successful in tapering down to oxycodone dose We will wean him off oxycodone very slowly He is able to reduce oxycodone to 5 mg once or twice a day  Acquired hypothyroidism He stated he is compliant taking Synthroid I will adjust the dose of medicine as needed  No orders of the defined types were placed in this encounter.   All questions were answered. The patient knows to call the clinic with any problems, questions or concerns. The total time spent in the appointment was 20 minutes encounter with patients including review of chart and various tests results, discussions about plan of care and coordination of care plan   [name], MD 07/25/2021 3:23 PM  INTERVAL HISTORY: Please see below for problem oriented charting. he returns for treatment follow-up on pembrolizumab for recurrent head and neck cancer He is doing well His chronic pain is stable He uses less oxycodone He has gained a lot of weight recently No other new symptoms  REVIEW OF SYSTEMS:   Constitutional: Denies fevers, chills or abnormal weight loss Eyes: Denies blurriness of vision Ears,  nose, mouth, throat, and face: Denies mucositis or sore throat Respiratory: Denies cough, dyspnea or wheezes Cardiovascular: Denies palpitation, chest discomfort or lower extremity swelling Gastrointestinal:  Denies nausea, heartburn or change in bowel habits Skin: Denies abnormal skin rashes Lymphatics: Denies new lymphadenopathy or easy bruising Neurological:Denies numbness, tingling or new weaknesses Behavioral/Psych: Mood is stable, no new changes  All other systems were reviewed with the patient and are negative.  I have reviewed the past medical history, past surgical history, social history and family history with the patient and they are unchanged from previous note.  ALLERGIES:  is allergic to phenergan [promethazine hcl], heparin, and clindamycin.  MEDICATIONS:  Current Outpatient Medications  Medication Sig Dispense Refill   Accu-Chek Softclix Lancets lancets CHECK BLOOD GLUCOSE ONCE A DAY IN THE MORNING. 100 each 12   blood glucose meter kit and supplies KIT Dispense based on patient and insurance preference. Use up to four times daily as directed. (FOR ICD-9 250.00, 250.01). 1 each 1   Blood Glucose Monitoring Suppl (ACCU-CHEK GUIDE) w/Device KIT USE ONCE DAILY AS DIRECTED IN THE MORNING 1 kit 0   cetirizine (ZYRTEC) 10 MG tablet Take 1 tablet (10 mg total) by mouth daily as needed for allergies. 100 tablet 0   glucose blood test strip CHECK BLOOD GLUCOSE ONCE A DAY IN THE MORNING 100 strip 12   levothyroxine (SYNTHROID) 112 MCG tablet Take 1 tablet (112 mcg total) by mouth daily before breakfast. 90 tablet 1   metFORMIN (GLUCOPHAGE) 500 MG tablet TAKE 1 TABLET BY MOUTH 2 TIMES DAILY WITH A MEAL 60 tablet 2   ondansetron (ZOFRAN) 8  MG tablet Take 1 tablet (8 mg total) by mouth every 8 (eight) hours as needed for nausea or vomiting. 90 tablet 1   oxyCODONE (OXY IR/ROXICODONE) 5 MG immediate release tablet Take 1 tablet (5 mg total) by mouth 3 (three) times daily as needed for  severe pain. 90 tablet 0   No current facility-administered medications for this visit.   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 Pan-endoscopy elsewhere showed cancer from nasopharyngeal space.   01/04/2007 - 02/20/2007 Chemotherapy   He received 2 cycles of cisplatin and 5FU followed by concurrent chemo with weekly cisplatin and radiation. He only received 2 doses of chemo due to severe mucositis, nausea and weight loss.   04/05/2007 - 08/04/2007 Chemotherapy   He received 4 more courses of cisplatin with 5FU and had complete response   07/05/2009 Procedure   Fine-needle aspirate of the right level II lymph nodes come from recurrent metastatic disease. Repeat endoscopy and CT scan show no evidence of disease elsewhere.   07/08/2009 - 12/02/2009 Chemotherapy   He was given 6 cycles of carboplatin, 5-FU and docetaxel   12/03/2009 Surgery   He has surgery to the residual lymph node on the right neck which showed no evidence of disease.    02/22/2012 Imaging   Repeat imaging study showed large recurrent mass. He was referred elsewhere for further treatment.   05/03/2012 Surgery   He underwent left upper lobectomy.   04/29/2013 Imaging   PEt scan showed lesion on right level II B and lower lung was abnormal   06/03/2013 - 02/02/2014 Chemotherapy   He had 6 cycles of chemotherapy when he was found to have recurrence of cancer and had received oxaliplatin and capecitabine   06/07/2014 Imaging   PET CT scan showed persistent disease in the right neck lymph nodes and left lung   06/29/2014 Procedure   Accession: [id number] repeat LUL biopsy confirmed metastatic cancer   07/18/2014 - 07/31/2014 Radiation Therapy   He received palliative radiation therapy to the lungs   10/10/2014 Imaging   CT scan of the chest, abdomen and pelvis show regression in the size of the lung nodule in the left upper lobe and stable pulmonary nodules   01/24/2015 Imaging   CT scan showed stable disease in neck and lung   06/19/2015 Imaging   CT scan of the neck and the chest show possible  mild progression of the nodule in the right side of the neck.   06/25/2015 Imaging   PET scan confirmed disease recurrence in the neck   07/07/2015 Imaging   [facility]   09/03/2015 - 08/26/2018 Chemotherapy   He received palliative chemo with Nivolumab   10/29/2015 Imaging   PET CT showed positive response to Rx   02/28/2016 Imaging   Ct abdomen showed abnormal thinkening in his stomach   03/03/2016 Imaging   CT: Right sternocleidomastoid muscle metastasis appears less distinct but otherwise not significantly changed in size or configuration since 06/19/2015.2. Left level 3 lymph node which was hypermetabolic by PET-CT in January 2017 appears slightly smaller   04/01/2016 Imaging   CT cervical spine showed no acute fracture or traumatic malalignment in the cervical spine   04/22/2016 Procedure   Port-a-cath placed.   06/16/2016 Imaging   Ct neck  showed right sternocleidomastoid muscle metastasis is further decreased in conspicuity since May, and has mildly decreased in size since September 2016. Continued stability of sub-centimeter left cervical lymph nodes. No new or progressive metastatic disease in the neck.   06/16/2016 Imaging   CT chest showed stable masslike radiation fibrosis in the left upper lobe. Stable subcentimeter pulmonary nodules in the bilateral lower lobes. No new or progressive metastatic disease in the chest. Nonobstructing left renal stone.   10/13/2016 Imaging   Ct neck showed unchanged right sternocleidomastoid muscle metastasis. Unchanged subcentimeter left cervical lymph nodes. No evidence of new or progressive metastatic disease in the neck.   10/13/2016 Imaging   CT chest showed tiny hypervascular foci in the liver, not definitely seen on prior imaging of 06/16/2016 and 02/28/2016. Abdomen MRI without and with contrast recommended to further evaluate as metastatic disease is a concern. 2. Stable appearance of post treatment changes left upper lung and scattered tiny bilateral pulmonary nodules.   02/11/2017 Imaging   Ct neck: Lymph node mass right posterior neck appears improved from the prior study. Small posterior lymph nodes on the left unchanged. Occluded right jugular vein unchanged.   02/11/2017 Imaging   1. Similar appearance of postsurgical and radiation changes in the left upper lobe. 2. Similar bilateral pulmonary nodules. 3. No thoracic adenopathy. 4. Subtle foci of post-contrast enhancement within the liver are suboptimally characterized on this nondedicated study. Likely similar. These could either be re-evaluated at followup or more entirely characterized with abdominal MRI. 5. Left nephrolithiasis.   05/19/2017 Imaging   Matted lymph node mass right posterior neck appears larger in the recent CT. Accurate measurements difficult due to infiltrating tumor margins and infiltration of the muscle. Right  jugular vein again appears occluded or resected. Small left posterior lymph nodes stable. Left upper lobe airspace density stable and similar to the prior CT   06/03/2017 PET scan   1. Hypermetabolic ill-defined right level IIb lymph node, about 1.3 cm in diameter with maximum SUV 9.5 (formerly 8.1). Appearance suspicious for residual/recurrent malignancy. No worrisome left-sided lesion. 2. Left suprahilar indistinct opacity demonstrates no worrisome hypermetabolic activity. The 5 mm left lower lobe pulmonary nodule is stable and not currently hypermetabolic although below sensitive PET-CT size thresholds. 3. Other imaging findings of potential clinical significance: Bilateral nonobstructive nephrolithiasis. Chronic bilateral maxillary sinusitis.   05/11/2018 PET scan   1. Continued chronic accentuated metabolic activity in the vicinity of right level IIB and the adjacent right sternocleidomastoid muscle, with ill definition of surrounding tissue planes. Maximum SUV is currently 8.1, formerly 9.5. Accentuated metabolic  activity is been present in this vicinity back through 06/25/2015, and there was also some low-level activity in this vicinity on 06/07/2014. Some of this may be from scarring and local muscular activity although clearly a component of residual tumor is difficult to exclude given the focally high activity. 2. Other imaging findings of potential clinical significance: Chronic bilateral maxillary sinusitis. Chronic scarring in the left upper lobe. Chronically stable 5 mm left lower lobe nodule is considered benign. Nonobstructive left nephrolithiasis.   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 COMMENT:No significant keratinization is identified. Other basaloid carcinomas are in the differential diagnosis. No cell block material is available for  further testing.   09/12/2018 Procedure   He underwent fine Needle Aspiration   10/04/2018 PET scan   1. Significant progression of local recurrence laterally in the mid right neck with an enlarging, increasingly hypermetabolic soft tissue mass. This involves the right sternocleidomastoid muscle. 2. Small lymph nodes in the right axilla are increasingly hypermetabolic. These are nonspecific and potentially reactive, although could reflect a small metastases. Small hypermetabolic nodule in the left suprasternal notch is unchanged. 3. No other evidence of metastatic disease.     10/07/2018 - 12/23/2018 Chemotherapy   The patient had cisplatin plus gemzar   12/07/2018 Imaging   1. Decreased size of lateral right neck mass. 2. Unchanged soft tissue nodule in the suprasternal notch. 3. No evidence of new metastatic disease in the neck.     05/30/2019 Imaging   CT neck No clear change or progression compared to the study of March. Overall measurements of the right lateral neck mass are similar, approximately 3 x 1.8 cm. See above discussion. One could argue that there is slight increase in lateral bulging, possibly with an increase in contrast enhancement, towards the inferior margin. This is of questionable validity but could possibly represent some progression or inflammatory change. Other findings in the region are stable.   07/20/2019 - 09/15/2019 Chemotherapy   The patient had dexamethasone (DECADRON) 4 MG tablet, 1 of 1 cycle, Start date: --, End date: -- palonosetron (ALOXI) injection 0.25 mg, 0.25 mg, Intravenous,  Once, 3 of 4 cycles Administration: 0.25 mg (07/20/2019), 0.25 mg (08/10/2019), 0.25 mg (09/08/2019) CISplatin (PLATINOL) 84 mg in sodium chloride 0.9 % 250 mL chemo infusion, 40 mg/m2 = 84 mg (80 % of original dose 50 mg/m2), Intravenous,  Once, 3 of 4 cycles Dose modification: 40 mg/m2 (80 % of original dose 50 mg/m2, Cycle 1, Reason: Dose Not Tolerated) Administration: 84 mg  (07/20/2019), 84 mg (08/10/2019), 83 mg (09/08/2019) gemcitabine (GEMZAR) 1,600 mg in sodium chloride 0.9 % 250 mL chemo infusion, 1,672 mg (80 % of original dose 1,000 mg/m2), Intravenous,  Once, 3 of 4 cycles Dose modification: 800 mg/m2 (80 % of original dose 1,000 mg/m2, Cycle 1, Reason: Provider Judgment) Administration: 1,600 mg (07/20/2019), 1,600 mg (07/27/2019), 1,600 mg (08/10/2019), 1,600 mg (08/17/2019), 1,600 mg (09/08/2019), 1,672 mg (09/15/2019) ondansetron (ZOFRAN) 8 mg, dexamethasone (DECADRON) 10 mg in sodium chloride 0.9 % 50 mL IVPB, , Intravenous,  Once, 3 of 4 cycles Administration:  (09/15/2019) fosaprepitant (EMEND) 150 mg, dexamethasone (DECADRON) 12 mg in sodium chloride 0.9 % 145 mL IVPB, , Intravenous,  Once, 3 of 4 cycles Administration:  (07/20/2019),  (08/10/2019),  (09/08/2019)   for chemotherapy treatment.     10/02/2019 Imaging   CT neck  As compared to 05/30/2019, no significant interval change in size of an ill-defined mass within the right lateral neck, again measuring 3.3 x 1.8 cm in transaxial dimensions.   Unchanged mildly enlarged left level I lymph node measuring 1.1 cm in short axis.   Unchanged node or nodule at the thoracic inlet, measuring 1.3 x 0.8 cm.   Please refer to concurrently performed chest CT for a description of findings below the level of the thoracic inlet.     10/02/2019 Imaging   CT chest 1. No new or progressive findings in the chest to suggest metastatic disease. 2. Bilateral subcentimeter solid pulmonary nodules are stable since 2018. 3. Hyperdense 1.1 cm anterior liver focus, not clearly visualized on prior studies. Suggest MRI abdomen without and with IV contrast for further characterization.   10/20/2019 - 12/29/2019 Chemotherapy   The patient had ondansetron (ZOFRAN) injection 8 mg, 8 mg (100 % of original dose 8 mg), Intravenous,  Once, 2 of 5 cycles Dose modification: 8 mg (original dose 8 mg, Cycle 2) Administration: 8 mg  (11/17/2019), 8 mg (12/15/2019), 8 mg (12/29/2019) gemcitabine (GEMZAR) 2,000 mg in sodium chloride 0.9 % 250 mL chemo infusion, 2,090 mg, Intravenous,  Once, 3 of 6 cycles Administration: 2,000 mg (10/20/2019), 2,000 mg (11/03/2019), 2,000 mg (11/17/2019), 2,000 mg (12/15/2019), 2,000 mg (12/29/2019)   for chemotherapy treatment.     06/12/2020 Imaging   1. Enlarging superficial, exophytic component of the chronic right sternocleidomastoid muscle mass. See series 6, image 55. 2. Elsewhere stable CT appearance of the Neck.   06/12/2020 Imaging   Post treatment scarring in the left hemithorax, stable. No evidence recurrent or metastatic disease   06/14/2020 - 10/15/2020 Chemotherapy   He received carboplatin, 5FU and Beryle Flock        10/31/2020 Procedure   Interval improvement in right lateral lymph node mass. Improvement in dermal component as well as invasion of the right sternocleidomastoid muscle.   10 mm submental lymph node slightly enlarged compared to the prior study. Continued follow-up recommended.   11/01/2020 -  Chemotherapy   Patient is on Treatment Plan : HEAD/NECK Pembrolizumab Q21D       PHYSICAL EXAMINATION: ECOG PERFORMANCE STATUS: 1 - Symptomatic but completely ambulatory  Vitals:   07/25/21 1240  BP: 121/71  Pulse: 71  Resp: 18  Temp: (!) 97.5 F (36.4 C)  SpO2: 100%   Filed Weights   07/25/21 1240  Weight: 189 lb 6.4 oz (85.9 kg)    GENERAL:alert, no distress and comfortable SKIN: skin color, texture, turgor are normal, no rashes or significant lesions EYES: normal, Conjunctiva are pink and non-injected, sclera clear OROPHARYNX:no exudate, no erythema and lips, buccal mucosa, and tongue normal  NECK: supple, thyroid normal size, non-tender, without nodularity LYMPH:  no palpable lymphadenopathy in the cervical, axillary or inguinal LUNGS: clear to auscultation and percussion with normal breathing effort HEART: regular rate & rhythm and no murmurs and no lower  extremity edema ABDOMEN:abdomen soft, non-tender and normal bowel sounds Musculoskeletal:no cyanosis of digits and no clubbing  NEURO: alert & oriented x 3 with fluent speech, no focal motor/sensory deficits  LABORATORY DATA:  I have reviewed the data as listed    Component Value Date/Time   NA 138 07/25/2021 1221   NA 139 09/22/2017 0829   K 3.8 07/25/2021 1221   K 3.5 09/22/2017 0829   CL 103 07/25/2021 1221   CO2 28 07/25/2021 1221   CO2 26 09/22/2017 0829   GLUCOSE 136 (H)  07/25/2021 1221   GLUCOSE 133 09/22/2017 0829   BUN 14 07/25/2021 1221   BUN 14.1 09/22/2017 0829   CREATININE 0.93 07/25/2021 1221   CREATININE 0.9 09/22/2017 0829   CALCIUM 9.0 07/25/2021 1221   CALCIUM 9.1 09/22/2017 0829   PROT 7.5 07/25/2021 1221   PROT 6.8 09/22/2017 0829   ALBUMIN 4.0 07/25/2021 1221   ALBUMIN 4.1 09/22/2017 0829   AST 28 07/25/2021 1221   AST 22 09/22/2017 0829   ALT 33 07/25/2021 1221   ALT 30 09/22/2017 0829   ALKPHOS 60 07/25/2021 1221   ALKPHOS 55 09/22/2017 0829   BILITOT 0.4 07/25/2021 1221   BILITOT 0.35 09/22/2017 0829   GFRNONAA >60 07/25/2021 1221   GFRAA >60 07/05/2020 0845   GFRAA >60 02/06/2019 1215    No results found for: SPEP, UPEP  Lab Results  Component Value Date   WBC 6.5 07/25/2021   NEUTROABS 3.7 07/25/2021   HGB 13.7 07/25/2021   HCT 39.9 07/25/2021   MCV 85.8 07/25/2021   PLT 230 07/25/2021      Chemistry      Component Value Date/Time   NA 138 07/25/2021 1221   NA 139 09/22/2017 0829   K 3.8 07/25/2021 1221   K 3.5 09/22/2017 0829   CL 103 07/25/2021 1221   CO2 28 07/25/2021 1221   CO2 26 09/22/2017 0829   BUN 14 07/25/2021 1221   BUN 14.1 09/22/2017 0829   CREATININE 0.93 07/25/2021 1221   CREATININE 0.9 09/22/2017 0829      Component Value Date/Time   CALCIUM 9.0 07/25/2021 1221   CALCIUM 9.1 09/22/2017 0829   ALKPHOS 60 07/25/2021 1221   ALKPHOS 55 09/22/2017 0829   AST 28 07/25/2021 1221   AST 22 09/22/2017 0829    ALT 33 07/25/2021 1221   ALT 30 09/22/2017 0829   BILITOT 0.4 07/25/2021 1221   BILITOT 0.35 09/22/2017 3799

## 2021-07-25 NOTE — Assessment & Plan Note (Signed)
He is successful in tapering down to oxycodone dose We will wean him off oxycodone very slowly He is able to reduce oxycodone to 5 mg once or twice a day

## 2021-07-25 NOTE — Assessment & Plan Note (Signed)
He stated he is compliant taking Synthroid I will adjust the dose of medicine as needed

## 2021-07-26 LAB — T4: T4, Total: 4.9 ug/dL (ref 4.5–12.0)

## 2021-08-20 ENCOUNTER — Other Ambulatory Visit: Payer: Self-pay | Admitting: Hematology and Oncology

## 2021-08-20 ENCOUNTER — Telehealth: Payer: Self-pay

## 2021-08-20 ENCOUNTER — Other Ambulatory Visit (HOSPITAL_BASED_OUTPATIENT_CLINIC_OR_DEPARTMENT_OTHER): Payer: Self-pay

## 2021-08-20 MED ORDER — OXYCODONE HCL 5 MG PO TABS: 5.0000 mg | ORAL_TABLET | Freq: Three times a day (TID) | ORAL | 0 refills | Status: DC | PRN

## 2021-08-20 MED ORDER — OXYCODONE HCL 5 MG PO TABS
5.0000 mg | ORAL_TABLET | Freq: Three times a day (TID) | ORAL | 0 refills | Status: DC | PRN
  Filled 2021-08-20: qty 90, 30d supply, fill #0

## 2021-08-20 NOTE — Telephone Encounter (Signed)
Pt called in to request refill for oxycodone 5 mg IR 1 tab PO TID PRN sever pain, qty 90. Request given to Dr Lindi Adie. Pt aware and verbalized thanks.

## 2021-08-21 ENCOUNTER — Other Ambulatory Visit (HOSPITAL_BASED_OUTPATIENT_CLINIC_OR_DEPARTMENT_OTHER): Payer: Self-pay

## 2021-09-04 ENCOUNTER — Telehealth: Payer: Self-pay | Admitting: *Deleted

## 2021-09-04 ENCOUNTER — Encounter: Payer: Self-pay | Admitting: Hematology and Oncology

## 2021-09-04 NOTE — Telephone Encounter (Signed)
Pt called to cancel all 12/2 appointments due to transporting wife to [place] to take a flight to [place]. Wanted to be schedule for 12/9. Made provider aware and LOS was sent. Advised pt there was no guarantee due to infusion already being full. Pt verbalized understanding.

## 2021-09-05 ENCOUNTER — Inpatient Hospital Stay: Payer: Medicaid Other

## 2021-09-05 ENCOUNTER — Inpatient Hospital Stay: Payer: Medicaid Other | Admitting: Hematology and Oncology

## 2021-09-09 ENCOUNTER — Telehealth: Payer: Self-pay

## 2021-09-09 ENCOUNTER — Encounter: Payer: Self-pay | Admitting: Hematology and Oncology

## 2021-09-09 ENCOUNTER — Other Ambulatory Visit (HOSPITAL_BASED_OUTPATIENT_CLINIC_OR_DEPARTMENT_OTHER): Payer: Self-pay

## 2021-09-09 NOTE — Telephone Encounter (Signed)
Returned him call. Reviewed upcoming appts on 12/9 for lab/ MD. The scheduler is still working on infusion appt and will call him.  He is complaining of neck pain at tumor site from the coldness. He uses a scarf to help. Denies swelling. The pain is almost a numbing pain. He is asking when is next scan will be and will talk about this at upcoming appt.

## 2021-09-12 ENCOUNTER — Inpatient Hospital Stay: Payer: Medicaid Other

## 2021-09-12 ENCOUNTER — Inpatient Hospital Stay (HOSPITAL_BASED_OUTPATIENT_CLINIC_OR_DEPARTMENT_OTHER): Payer: Medicaid Other

## 2021-09-12 ENCOUNTER — Encounter: Payer: Self-pay | Admitting: Hematology and Oncology

## 2021-09-12 ENCOUNTER — Other Ambulatory Visit: Payer: Self-pay

## 2021-09-12 ENCOUNTER — Inpatient Hospital Stay: Payer: Medicaid Other | Attending: Hematology and Oncology | Admitting: Hematology and Oncology

## 2021-09-12 ENCOUNTER — Telehealth: Payer: Self-pay | Admitting: Hematology and Oncology

## 2021-09-12 DIAGNOSIS — C119 Malignant neoplasm of nasopharynx, unspecified: Secondary | ICD-10-CM | POA: Diagnosis not present

## 2021-09-12 DIAGNOSIS — Z79899 Other long term (current) drug therapy: Secondary | ICD-10-CM | POA: Insufficient documentation

## 2021-09-12 DIAGNOSIS — E119 Type 2 diabetes mellitus without complications: Secondary | ICD-10-CM | POA: Diagnosis not present

## 2021-09-12 DIAGNOSIS — Z95828 Presence of other vascular implants and grafts: Secondary | ICD-10-CM

## 2021-09-12 DIAGNOSIS — M542 Cervicalgia: Secondary | ICD-10-CM | POA: Diagnosis not present

## 2021-09-12 DIAGNOSIS — Z7189 Other specified counseling: Secondary | ICD-10-CM

## 2021-09-12 DIAGNOSIS — G893 Neoplasm related pain (acute) (chronic): Secondary | ICD-10-CM | POA: Diagnosis not present

## 2021-09-12 DIAGNOSIS — C760 Malignant neoplasm of head, face and neck: Secondary | ICD-10-CM

## 2021-09-12 DIAGNOSIS — C7800 Secondary malignant neoplasm of unspecified lung: Secondary | ICD-10-CM

## 2021-09-12 DIAGNOSIS — Z5112 Encounter for antineoplastic immunotherapy: Secondary | ICD-10-CM | POA: Insufficient documentation

## 2021-09-12 DIAGNOSIS — E039 Hypothyroidism, unspecified: Secondary | ICD-10-CM | POA: Diagnosis not present

## 2021-09-12 LAB — CMP (CANCER CENTER ONLY)
ALT: 21 U/L (ref 0–44)
AST: 23 U/L (ref 15–41)
Albumin: 4.3 g/dL (ref 3.5–5.0)
Alkaline Phosphatase: 63 U/L (ref 38–126)
Anion gap: 11 (ref 5–15)
BUN: 12 mg/dL (ref 6–20)
CO2: 24 mmol/L (ref 22–32)
Calcium: 8.8 mg/dL — ABNORMAL LOW (ref 8.9–10.3)
Chloride: 103 mmol/L (ref 98–111)
Creatinine: 1.15 mg/dL (ref 0.61–1.24)
GFR, Estimated: >60 mL/min (ref >60)
Glucose, Bld: 172 mg/dL — ABNORMAL HIGH (ref 70–99)
Potassium: 3.9 mmol/L (ref 3.5–5.1)
Sodium: 138 mmol/L (ref 135–145)
Total Bilirubin: 0.5 mg/dL (ref 0.3–1.2)
Total Protein: 7.4 g/dL (ref 6.5–8.1)

## 2021-09-12 LAB — CBC WITH DIFFERENTIAL (CANCER CENTER ONLY)
Abs Immature Granulocytes: 0.01 10*3/uL (ref 0.00–0.07)
Basophils Absolute: 0 10*3/uL (ref 0.0–0.1)
Basophils Relative: 1 %
Eosinophils Absolute: 0.2 10*3/uL (ref 0.0–0.5)
Eosinophils Relative: 3 %
HCT: 42.7 % (ref 39.0–52.0)
Hemoglobin: 14.4 g/dL (ref 13.0–17.0)
Immature Granulocytes: 0 %
Lymphocytes Relative: 27 %
Lymphs Abs: 1.7 10*3/uL (ref 0.7–4.0)
MCH: 29.3 pg (ref 26.0–34.0)
MCHC: 33.7 g/dL (ref 30.0–36.0)
MCV: 86.8 fL (ref 80.0–100.0)
Monocytes Absolute: 0.4 10*3/uL (ref 0.1–1.0)
Monocytes Relative: 6 %
Neutro Abs: 4 10*3/uL (ref 1.7–7.7)
Neutrophils Relative %: 63 %
Platelet Count: 271 10*3/uL (ref 150–400)
RBC: 4.92 MIL/uL (ref 4.22–5.81)
RDW: 14.4 % (ref 11.5–15.5)
WBC Count: 6.3 10*3/uL (ref 4.0–10.5)
nRBC: 0 % (ref 0.0–0.2)

## 2021-09-12 LAB — TSH: TSH: 4.928 u[IU]/mL — ABNORMAL HIGH (ref 0.320–4.118)

## 2021-09-12 MED ORDER — ANTICOAGULANT SODIUM CITRATE LOCK FLUSH 4% (120 MG/3ML) IV SOLN
5.0000 mL | PREFILLED_SYRINGE | Freq: Once | INTRAVENOUS | Status: DC
  Filled 2021-09-12: qty 6

## 2021-09-12 MED ORDER — SODIUM CHLORIDE 0.9 % IV SOLN
400.0000 mg | Freq: Once | INTRAVENOUS | Status: AC
  Administered 2021-09-12: 400 mg via INTRAVENOUS
  Filled 2021-09-12: qty 16

## 2021-09-12 MED ORDER — ONDANSETRON HCL 4 MG/2ML IJ SOLN
8.0000 mg | Freq: Once | INTRAMUSCULAR | Status: AC
  Administered 2021-09-12: 8 mg via INTRAVENOUS
  Filled 2021-09-12: qty 4

## 2021-09-12 MED ORDER — SODIUM CHLORIDE 0.9% FLUSH
10.0000 mL | Freq: Once | INTRAVENOUS | Status: AC
  Administered 2021-09-12: 10 mL

## 2021-09-12 MED ORDER — SODIUM CHLORIDE 0.9% FLUSH: 10.0000 mL | INTRAVENOUS | Status: DC | PRN

## 2021-09-12 MED ORDER — SODIUM CHLORIDE 0.9 % IV SOLN: Freq: Once | INTRAVENOUS | Status: AC

## 2021-09-12 NOTE — Progress Notes (Signed)
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[place]) Clinically, his examination is benign There is no role for CT imaging at this point He will continue pembrolizumab every 6 weeks We will continue to monitor closely for toxicity  Neck pain on right side 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   All questions were answered. The patient knows to call the clinic with any problems, questions or concerns. The total time spent in the appointment was 30 minutes encounter with patients including review of chart and various tests results, discussions about plan of care and coordination of care plan   [name], MD 09/12/2021 4:08 PM  INTERVAL HISTORY: Please see below for problem oriented charting. he returns for treatment follow-up on pembrolizumab maintenance He complain of slight worsening neck pain His appetite is fair and he has lost some weight The patient appears anxious He is also somewhat  depressed  REVIEW OF SYSTEMS:   Constitutional: Denies fevers, chills or abnormal weight loss Eyes: Denies blurriness of vision Ears, nose, mouth, throat, and face: Denies mucositis or sore throat Respiratory: Denies cough, dyspnea or wheezes Cardiovascular: Denies palpitation, chest discomfort or lower extremity swelling Gastrointestinal:  Denies nausea, heartburn or change in bowel habits Skin: Denies abnormal skin rashes Lymphatics: Denies new lymphadenopathy or easy bruising Neurological:Denies numbness, tingling or new weaknesses Behavioral/Psych: Mood is stable, no new changes  All other systems were reviewed with the patient and are negative.  I have reviewed the past medical history, past surgical history, social history and family history with the patient and they are unchanged from previous note.  ALLERGIES:  is allergic to phenergan [promethazine hcl], heparin, and clindamycin.  MEDICATIONS:  Current Outpatient Medications  Medication Sig Dispense Refill   Accu-Chek Softclix Lancets lancets CHECK BLOOD GLUCOSE ONCE A DAY IN THE MORNING. 100 each 12   blood glucose meter kit and supplies KIT Dispense based on patient and insurance preference. Use up to four times daily as directed. (FOR ICD-9 250.00, 250.01). 1 each 1   Blood Glucose Monitoring Suppl (ACCU-CHEK GUIDE) w/Device KIT USE ONCE DAILY AS DIRECTED IN THE MORNING 1 kit 0   cetirizine (ZYRTEC) 10 MG tablet Take 1 tablet (10 mg total) by mouth daily as needed for allergies. 100 tablet 0   glucose blood test strip CHECK BLOOD GLUCOSE ONCE A DAY IN THE MORNING 100 strip 12   levothyroxine (SYNTHROID) 112 MCG tablet Take 1 tablet (112 mcg total) by mouth daily before breakfast. 90 tablet 1   metFORMIN (GLUCOPHAGE) 500 MG tablet TAKE 1 TABLET BY MOUTH 2 TIMES DAILY  WITH A MEAL 60 tablet 2   ondansetron (ZOFRAN) 8 MG tablet Take 1 tablet (8 mg total) by mouth every 8 (eight) hours as needed for nausea or vomiting. 90 tablet 1    oxyCODONE (OXY IR/ROXICODONE) 5 MG immediate release tablet Take 1 tablet (5 mg total) by mouth 3 (three) times daily as needed for severe pain. 90 tablet 0   No current facility-administered medications for this visit.   Facility-Administered Medications Ordered in Other Visits  Medication Dose Route Frequency Provider Last Rate Last Admin   anticoagulant sodium citrate 4% solution 5 mL  5 mL Intracatheter Once Alvy Bimler, Drystan Reader, MD       anticoagulant sodium citrate solution 5 mL  5 mL Intracatheter Once Alvy Bimler, Areta Terwilliger, MD       anticoagulant sodium citrate solution 5 mL  5 mL Intracatheter Once Alvy Bimler, Jensyn Cambria, MD       pembrolizumab (KEYTRUDA) 400 mg in sodium chloride 0.9 % 50 mL chemo infusion  400 mg Intravenous Once Makyiah Lie, MD       sodium chloride flush (NS) 0.9 % injection 10 mL  10 mL Intracatheter PRN [name], MD        SUMMARY OF ONCOLOGIC HISTORY: Oncology History Overview Note  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 Pan-endoscopy elsewhere showed cancer from nasopharyngeal space.   01/04/2007 - 02/20/2007 Chemotherapy   He received 2 cycles of cisplatin and 5FU followed by concurrent chemo with weekly cisplatin and radiation. He only received 2 doses of chemo due to severe mucositis, nausea and weight loss.   04/05/2007 - 08/04/2007 Chemotherapy   He received 4 more courses of cisplatin with 5FU and had complete response   07/05/2009 Procedure   Fine-needle aspirate of the right level II lymph nodes come from recurrent metastatic disease. Repeat  endoscopy and CT scan show no evidence of disease elsewhere.   07/08/2009 - 12/02/2009 Chemotherapy   He was given 6 cycles of carboplatin, 5-FU and docetaxel   12/03/2009 Surgery   He has surgery to the residual lymph node on the right neck which showed no evidence of disease.   02/22/2012 Imaging   Repeat imaging study showed large recurrent mass. He was referred elsewhere for further treatment.   05/03/2012 Surgery   He underwent left upper lobectomy.   04/29/2013 Imaging   PEt scan showed lesion on right level II B and lower lung was abnormal   06/03/2013 - 02/02/2014 Chemotherapy   He had 6 cycles of chemotherapy when he was found to have recurrence of cancer and had received oxaliplatin and capecitabine   06/07/2014 Imaging   PET CT scan showed persistent disease in the right neck lymph nodes and left lung   06/29/2014 Procedure   Accession: [id number] repeat LUL biopsy confirmed metastatic cancer   07/18/2014 - 07/31/2014 Radiation Therapy   He received palliative radiation therapy to the lungs   10/10/2014 Imaging   CT scan of the chest, abdomen and pelvis show regression in the size of the lung nodule  in the left upper lobe and stable pulmonary nodules   01/24/2015 Imaging   CT scan showed stable disease in neck and lung   06/19/2015 Imaging   CT scan of the neck and the chest show possible mild progression of the nodule in the right side of the neck.   06/25/2015 Imaging   PET scan confirmed disease recurrence in the neck   07/07/2015 Imaging   He had [facility] neck [facility]   09/03/2015 - 08/26/2018 Chemotherapy   He received palliative chemo with Nivolumab   10/29/2015 Imaging   PET CT showed positive response to Rx   02/28/2016 Imaging   Ct abdomen showed abnormal thinkening in his stomach   03/03/2016 Imaging   CT: Right sternocleidomastoid muscle metastasis appears less distinct but otherwise not significantly changed in size or configuration since 06/19/2015.2. Left level 3  lymph node which was hypermetabolic by PET-CT in January 2017 appears slightly smaller   04/01/2016 Imaging   CT cervical spine showed no acute fracture or traumatic malalignment in the cervical spine   04/22/2016 Procedure   Port-a-cath placed.   06/16/2016 Imaging   Ct neck showed right sternocleidomastoid muscle metastasis is further decreased in conspicuity since May, and has mildly decreased in size since September 2016. Continued stability of sub-centimeter left cervical lymph nodes. No new or progressive metastatic disease in the neck.   06/16/2016 Imaging   CT chest showed stable masslike radiation fibrosis in the left upper lobe. Stable subcentimeter pulmonary nodules in the bilateral lower lobes. No new or progressive metastatic disease in the chest. Nonobstructing left renal stone.   10/13/2016 Imaging   Ct neck showed unchanged right sternocleidomastoid muscle metastasis. Unchanged subcentimeter left cervical lymph nodes. No evidence of new or progressive metastatic disease in the neck.   10/13/2016 Imaging   CT chest showed tiny hypervascular foci in the liver, not definitely seen on prior imaging of 06/16/2016 and 02/28/2016. Abdomen MRI without and with contrast recommended to further evaluate as metastatic disease is a concern. 2. Stable appearance of post treatment changes left upper lung and scattered tiny bilateral pulmonary nodules.   02/11/2017 Imaging   Ct neck: Lymph node mass right posterior neck appears improved from the prior study. Small posterior lymph nodes on the left unchanged. Occluded right jugular vein unchanged.   02/11/2017 Imaging   1. Similar appearance of postsurgical and radiation changes in the left upper lobe. 2. Similar bilateral pulmonary nodules. 3. No thoracic adenopathy. 4. Subtle foci of post-contrast enhancement within the liver are suboptimally characterized on this nondedicated study. Likely similar. These could either be re-evaluated at followup  or more entirely characterized with abdominal MRI. 5. Left nephrolithiasis.   05/19/2017 Imaging   Matted lymph node mass right posterior neck appears larger in the recent CT. Accurate measurements difficult due to infiltrating tumor margins and infiltration of the muscle. Right jugular vein again appears occluded or resected. Small left posterior lymph nodes stable. Left upper lobe airspace density stable and similar to the prior CT   06/03/2017 PET scan   1. Hypermetabolic ill-defined right level IIb lymph node, about 1.3 cm in diameter with maximum SUV 9.5 (formerly 8.1). Appearance suspicious for residual/recurrent malignancy. No worrisome left-sided lesion. 2. Left suprahilar indistinct opacity demonstrates no worrisome hypermetabolic activity. The 5 mm left lower lobe pulmonary nodule is stable and not currently hypermetabolic although below sensitive PET-CT size thresholds. 3. Other imaging findings of potential clinical significance: Bilateral nonobstructive nephrolithiasis. Chronic bilateral maxillary sinusitis.  05/11/2018 PET scan   1. Continued chronic accentuated metabolic activity in the vicinity of right level IIB and the adjacent right sternocleidomastoid muscle, with ill definition of surrounding tissue planes. Maximum SUV is currently 8.1, formerly 9.5. Accentuated metabolic activity is been present in this vicinity back through 06/25/2015, and there was also some low-level activity in this vicinity on 06/07/2014. Some of this may be from scarring and local muscular activity although clearly a component of residual tumor is difficult to exclude given the focally high activity. 2. Other imaging findings of potential clinical significance: Chronic bilateral maxillary sinusitis. Chronic scarring in the left upper lobe. Chronically stable 5 mm left lower lobe nodule is considered benign. Nonobstructive left nephrolithiasis.   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 COMMENT:No significant keratinization is identified. Other basaloid carcinomas are in the differential diagnosis. No cell block material is available for further testing.   09/12/2018 Procedure   He underwent fine Needle Aspiration   10/04/2018 PET scan   1. Significant progression of local recurrence laterally in the mid right neck with an enlarging, increasingly hypermetabolic soft tissue mass. This involves the right sternocleidomastoid muscle. 2. Small lymph nodes in the right axilla are increasingly hypermetabolic. These are nonspecific and potentially reactive, although could reflect a small metastases. Small hypermetabolic nodule in the left suprasternal notch is unchanged. 3. No other evidence of metastatic disease.     10/07/2018 - 12/23/2018 Chemotherapy   The patient had cisplatin plus gemzar   12/07/2018 Imaging   1. Decreased size of lateral right neck mass. 2. Unchanged soft tissue nodule in the suprasternal notch. 3. No evidence of new metastatic disease in the neck.     05/30/2019 Imaging   CT neck No clear change or progression compared to the study of March. Overall measurements of the right lateral neck mass are similar, approximately 3 x 1.8 cm. See above discussion. One could argue that there is slight increase in lateral bulging, possibly with an increase in contrast enhancement, towards the inferior margin. This is of questionable validity but could possibly represent some progression or inflammatory change. Other findings in the region are stable.   07/20/2019 - 09/15/2019 Chemotherapy   The patient had dexamethasone (DECADRON) 4 MG tablet, 1 of 1 cycle, Start date: --, End date: -- palonosetron (ALOXI) injection 0.25 mg, 0.25 mg, Intravenous,  Once, 3 of 4 cycles Administration: 0.25 mg (07/20/2019), 0.25 mg (08/10/2019), 0.25 mg  (09/08/2019) CISplatin (PLATINOL) 84 mg in sodium chloride 0.9 % 250 mL chemo infusion, 40 mg/m2 = 84 mg (80 % of original dose 50 mg/m2), Intravenous,  Once, 3 of 4 cycles Dose modification: 40 mg/m2 (80 % of original dose 50 mg/m2, Cycle 1, Reason: Dose Not Tolerated) Administration: 84 mg (07/20/2019), 84 mg (08/10/2019), 83 mg (09/08/2019) gemcitabine (GEMZAR) 1,600 mg in sodium chloride 0.9 % 250 mL chemo infusion, 1,672 mg (80 % of original dose 1,000 mg/m2), Intravenous,  Once, 3 of 4 cycles Dose modification: 800 mg/m2 (80 % of original dose 1,000 mg/m2, Cycle 1, Reason: Provider Judgment) Administration: 1,600 mg (07/20/2019), 1,600 mg (07/27/2019), 1,600 mg (08/10/2019), 1,600 mg (08/17/2019), 1,600 mg (09/08/2019), 1,672 mg (09/15/2019) ondansetron (ZOFRAN) 8 mg, dexamethasone (DECADRON) 10 mg in sodium chloride 0.9 % 50 mL IVPB, , Intravenous,  Once, 3 of 4 cycles Administration:  (09/15/2019) fosaprepitant (EMEND) 150 mg, dexamethasone (DECADRON)  12 mg in sodium chloride 0.9 % 145 mL IVPB, , Intravenous,  Once, 3 of 4 cycles Administration:  (07/20/2019),  (08/10/2019),  (09/08/2019)   for chemotherapy treatment.     10/02/2019 Imaging   CT neck As compared to 05/30/2019, no significant interval change in size of an ill-defined mass within the right lateral neck, again measuring 3.3 x 1.8 cm in transaxial dimensions.   Unchanged mildly enlarged left level I lymph node measuring 1.1 cm in short axis.   Unchanged node or nodule at the thoracic inlet, measuring 1.3 x 0.8 cm.   Please refer to concurrently performed chest CT for a description of findings below the level of the thoracic inlet.     10/02/2019 Imaging   CT chest 1. No new or progressive findings in the chest to suggest metastatic disease. 2. Bilateral subcentimeter solid pulmonary nodules are stable since 2018. 3. Hyperdense 1.1 cm anterior liver focus, not clearly visualized on prior studies. Suggest MRI abdomen without  and with IV contrast for further characterization.   10/20/2019 - 12/29/2019 Chemotherapy   The patient had ondansetron (ZOFRAN) injection 8 mg, 8 mg (100 % of original dose 8 mg), Intravenous,  Once, 2 of 5 cycles Dose modification: 8 mg (original dose 8 mg, Cycle 2) Administration: 8 mg (11/17/2019), 8 mg (12/15/2019), 8 mg (12/29/2019) gemcitabine (GEMZAR) 2,000 mg in sodium chloride 0.9 % 250 mL chemo infusion, 2,090 mg, Intravenous,  Once, 3 of 6 cycles Administration: 2,000 mg (10/20/2019), 2,000 mg (11/03/2019), 2,000 mg (11/17/2019), 2,000 mg (12/15/2019), 2,000 mg (12/29/2019)   for chemotherapy treatment.     06/12/2020 Imaging   1. Enlarging superficial, exophytic component of the chronic right sternocleidomastoid muscle mass. See series 6, image 55. 2. Elsewhere stable CT appearance of the Neck.   06/12/2020 Imaging   Post treatment scarring in the left hemithorax, stable. No evidence recurrent or metastatic disease   06/14/2020 - 10/15/2020 Chemotherapy   He received carboplatin, 5FU and Beryle Flock        10/31/2020 Procedure   Interval improvement in right lateral lymph node mass. Improvement in dermal component as well as invasion of the right sternocleidomastoid muscle.   10 mm submental lymph node slightly enlarged compared to the prior study. Continued follow-up recommended.   11/01/2020 -  Chemotherapy   Patient is on Treatment Plan : HEAD/NECK Pembrolizumab Q21D       PHYSICAL EXAMINATION: ECOG PERFORMANCE STATUS: 1 - Symptomatic but completely ambulatory  Vitals:   09/12/21 1441  BP: (!) 141/82  Pulse: 67  Resp: 20  Temp: 97.7 F (36.5 C)  SpO2: 100%   Filed Weights   09/12/21 1441  Weight: 186 lb 3.2 oz (84.5 kg)    GENERAL:alert, no distress and comfortable SKIN: skin color, texture, turgor are normal, no rashes or significant lesions EYES: normal, Conjunctiva are pink and non-injected, sclera clear OROPHARYNX:no exudate, no erythema and lips, buccal mucosa,  and tongue normal  NECK: Noted neck fibrosis.  No new lesion LYMPH:  no palpable lymphadenopathy in the cervical, axillary or inguinal LUNGS: clear to auscultation and percussion with normal breathing effort HEART: regular rate & rhythm and no murmurs and no lower extremity edema ABDOMEN:abdomen soft, non-tender and normal bowel sounds Musculoskeletal:no cyanosis of digits and no clubbing  NEURO: alert & oriented x 3 with fluent speech, no focal motor/sensory deficits  LABORATORY DATA:  I have reviewed the data as listed    Component Value Date/Time   NA 138 09/12/2021 1440  NA 139 09/22/2017 0829   K 3.9 09/12/2021 1440   K 3.5 09/22/2017 0829   CL 103 09/12/2021 1440   CO2 24 09/12/2021 1440   CO2 26 09/22/2017 0829   GLUCOSE 172 (H) 09/12/2021 1440   GLUCOSE 133 09/22/2017 0829   BUN 12 09/12/2021 1440   BUN 14.1 09/22/2017 0829   CREATININE 1.15 09/12/2021 1440   CREATININE 0.9 09/22/2017 0829   CALCIUM 8.8 (L) 09/12/2021 1440   CALCIUM 9.1 09/22/2017 0829   PROT 7.4 09/12/2021 1440   PROT 6.8 09/22/2017 0829   ALBUMIN 4.3 09/12/2021 1440   ALBUMIN 4.1 09/22/2017 0829   AST 23 09/12/2021 1440   AST 22 09/22/2017 0829   ALT 21 09/12/2021 1440   ALT 30 09/22/2017 0829   ALKPHOS 63 09/12/2021 1440   ALKPHOS 55 09/22/2017 0829   BILITOT 0.5 09/12/2021 1440   BILITOT 0.35 09/22/2017 0829   GFRNONAA >60 09/12/2021 1440   GFRAA >60 07/05/2020 0845   GFRAA >60 02/06/2019 1215    No results found for: SPEP, UPEP  Lab Results  Component Value Date   WBC 6.3 09/12/2021   NEUTROABS 4.0 09/12/2021   HGB 14.4 09/12/2021   HCT 42.7 09/12/2021   MCV 86.8 09/12/2021   PLT 271 09/12/2021      Chemistry      Component Value Date/Time   NA 138 09/12/2021 1440   NA 139 09/22/2017 0829   K 3.9 09/12/2021 1440   K 3.5 09/22/2017 0829   CL 103 09/12/2021 1440   CO2 24 09/12/2021 1440   CO2 26 09/22/2017 0829   BUN 12 09/12/2021 1440   BUN 14.1 09/22/2017 0829    CREATININE 1.15 09/12/2021 1440   CREATININE 0.9 09/22/2017 0829      Component Value Date/Time   CALCIUM 8.8 (L) 09/12/2021 1440   CALCIUM 9.1 09/22/2017 0829   ALKPHOS 63 09/12/2021 1440   ALKPHOS 55 09/22/2017 0829   AST 23 09/12/2021 1440   AST 22 09/22/2017 0829   ALT 21 09/12/2021 1440   ALT 30 09/22/2017 0829   BILITOT 0.5 09/12/2021 1440   BILITOT 0.35 09/22/2017 0829

## 2021-09-12 NOTE — Assessment & Plan Note (Signed)
Clinically, his examination is benign There is no role for CT imaging at this point He will continue pembrolizumab every 6 weeks We will continue to monitor closely for toxicity

## 2021-09-12 NOTE — Assessment & Plan Note (Signed)
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

## 2021-09-12 NOTE — Telephone Encounter (Signed)
Called to confirm todays appt. No answer, left msg

## 2021-09-12 NOTE — Assessment & Plan Note (Signed)
His TSH is intermittently elevated He does not need dose adjustment but we will monitor that closely

## 2021-09-12 NOTE — Patient Instructions (Signed)
[facility]   Discharge Instructions: Thank you for choosing [facility] to provide your oncology and hematology care.   If you have a lab appointment with the [facility], please go directly to the [facility] and check in at the registration area.   Wear comfortable clothing and clothing appropriate for easy access to any Portacath or PICC line.   We strive to give you quality time with your provider. You may need to reschedule your appointment if you arrive late (15 or more minutes).  Arriving late affects you and other patients whose appointments are after yours.  Also, if you miss three or more appointments without notifying the office, you may be dismissed from the clinic at the provider's discretion.      For prescription refill requests, have your pharmacy contact our office and allow 72 hours for refills to be completed.    Today you received the following chemotherapy and/or immunotherapy agents: pembrolizumab.      To help prevent nausea and vomiting after your treatment, we encourage you to take your nausea medication as directed.  BELOW ARE SYMPTOMS THAT SHOULD BE REPORTED IMMEDIATELY: *FEVER GREATER THAN 100.4 F (38 C) OR HIGHER *CHILLS OR SWEATING *NAUSEA AND VOMITING THAT IS NOT CONTROLLED WITH YOUR NAUSEA MEDICATION *UNUSUAL SHORTNESS OF BREATH *UNUSUAL BRUISING OR BLEEDING *URINARY PROBLEMS (pain or burning when urinating, or frequent urination) *BOWEL PROBLEMS (unusual diarrhea, constipation, pain near the anus) TENDERNESS IN MOUTH AND THROAT WITH OR WITHOUT PRESENCE OF ULCERS (sore throat, sores in mouth, or a toothache) UNUSUAL RASH, SWELLING OR PAIN  UNUSUAL VAGINAL DISCHARGE OR ITCHING   Items with * indicate a potential emergency and should be followed up as soon as possible or go to the Emergency Department if any problems should occur.  Please show the CHEMOTHERAPY ALERT CARD or IMMUNOTHERAPY ALERT CARD at  check-in to the Emergency Department and triage nurse.  Should you have questions after your visit or need to cancel or reschedule your appointment, please contact [facility]  Dept: [phone]  and follow the prompts.  Office hours are 8:00 a.m. to 4:30 p.m. Monday - Friday. Please note that voicemails left after 4:00 p.m. may not be returned until the following business day.  We are closed weekends and major holidays. You have access to a nurse at all times for urgent questions. Please call the main number to the clinic Dept: [phone] and follow the prompts.   For any non-urgent questions, you may also contact your provider using MyChart. We now offer e-Visits for anyone 50 and older to request care online for non-urgent symptoms. For details visit [email or web address].   Also download the MyChart app! Go to the app store, search "MyChart", open the app, select [facility], and log in with your MyChart username and password.  Due to Covid, a mask is required upon entering the hospital/clinic. If you do not have a mask, one will be given to you upon arrival. For doctor visits, patients may have 1 support person aged 57 or older with them. For treatment visits, patients cannot have anyone with them due to current Covid guidelines and our immunocompromised population.

## 2021-09-12 NOTE — Assessment & Plan Note (Signed)
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

## 2021-09-13 LAB — T4: T4, Total: 7.2 ug/dL (ref 4.5–12.0)

## 2021-09-15 ENCOUNTER — Other Ambulatory Visit: Payer: Self-pay | Admitting: Hematology and Oncology

## 2021-09-15 ENCOUNTER — Telehealth: Payer: Self-pay

## 2021-09-15 ENCOUNTER — Encounter: Payer: Self-pay | Admitting: Hematology and Oncology

## 2021-09-15 ENCOUNTER — Other Ambulatory Visit (HOSPITAL_COMMUNITY): Payer: Self-pay

## 2021-09-15 MED ORDER — LEVOTHYROXINE SODIUM 125 MCG PO TABS
125.0000 ug | ORAL_TABLET | Freq: Every day | ORAL | 1 refills | Status: DC
  Filled 2021-09-15: qty 60, 60d supply, fill #0
  Filled 2022-01-07: qty 60, 60d supply, fill #1

## 2021-09-15 MED ORDER — OXYCODONE HCL 5 MG PO TABS
5.0000 mg | ORAL_TABLET | ORAL | 0 refills | Status: DC | PRN
  Filled 2021-09-15: qty 90, 15d supply, fill #0

## 2021-09-15 NOTE — Telephone Encounter (Signed)
Called patient back and gave him the following message from Dr. Alvy Bimler in regards to his medications. Confirmed with patient the pharmacy location and business hours. Patient verbalized an understanding of the information. All questions answered.

## 2021-09-15 NOTE — Telephone Encounter (Signed)
-----   Message from [name], MD sent at 09/15/2021  8:27 AM EST ----- Regarding: TSH and oxycodone Pls call him, I refilled his oxycodone and increased his synthroid a little bit, both sent to [facility]

## 2021-09-15 NOTE — Telephone Encounter (Signed)
-----   Message from [name], MD sent at 09/15/2021  8:27 AM EST ----- Regarding: TSH and oxycodone Pls call him, I refilled his oxycodone and increased his synthroid a little bit, both sent to [facility]

## 2021-09-15 NOTE — Telephone Encounter (Signed)
LVM requesting patient to callback. Callback number provided.

## 2021-09-16 ENCOUNTER — Telehealth: Payer: Self-pay | Admitting: *Deleted

## 2021-09-16 ENCOUNTER — Other Ambulatory Visit (HOSPITAL_COMMUNITY): Payer: Self-pay

## 2021-09-16 NOTE — Telephone Encounter (Signed)
Medication Prior Authorization Status:  Processed CoverMyMeds KEY: [id number]  Oxycodone 5 mg authorized 09/15/2021 noted today  Per [employer] [facility] [place] PA Case ID: [id number]   Effective 09/15/2021 through 03/14/2022 at 12:00 am.

## 2021-09-21 ENCOUNTER — Other Ambulatory Visit: Payer: Self-pay

## 2021-10-03 ENCOUNTER — Other Ambulatory Visit (HOSPITAL_BASED_OUTPATIENT_CLINIC_OR_DEPARTMENT_OTHER): Payer: Self-pay

## 2021-10-03 ENCOUNTER — Telehealth: Payer: Self-pay

## 2021-10-03 ENCOUNTER — Other Ambulatory Visit: Payer: Self-pay | Admitting: Hematology and Oncology

## 2021-10-03 MED ORDER — OXYCODONE HCL 5 MG PO TABS
5.0000 mg | ORAL_TABLET | ORAL | 0 refills | Status: DC | PRN
  Filled 2021-10-03: qty 90, 15d supply, fill #0

## 2021-10-03 NOTE — Telephone Encounter (Signed)
done

## 2021-10-03 NOTE — Telephone Encounter (Signed)
Called and told Rx sent to pharmacy.

## 2021-10-03 NOTE — Telephone Encounter (Signed)
He called and left a message requesting a Oxycodone 5 mg refill. He ask that you send it to [facility]/

## 2021-10-09 ENCOUNTER — Other Ambulatory Visit (HOSPITAL_BASED_OUTPATIENT_CLINIC_OR_DEPARTMENT_OTHER): Payer: Self-pay

## 2021-10-09 ENCOUNTER — Other Ambulatory Visit: Payer: Self-pay | Admitting: Hematology and Oncology

## 2021-10-17 IMAGING — CT CT CHEST W/ CM
3 of 4 series · 15 of 36 positions shown, 16 images · IV contrast (omnipaque)
Comparison: 09/24/2019.

CLINICAL DATA: Head neck cancer, assess treatment response.

EXAM:
CT CHEST WITH CONTRAST
TECHNIQUE: Multidetector CT imaging of the chest was performed during
intravenous contrast administration.
CONTRAST:  100mL OMNIPAQUE IOHEXOL 300 MG/ML  SOLN

[Series 4: axial st · axial · 0.80mm/px · z∈[-228,-62]mm · 4 of 139 slices shown, 5 images]
[im 28/139  mediastinal]
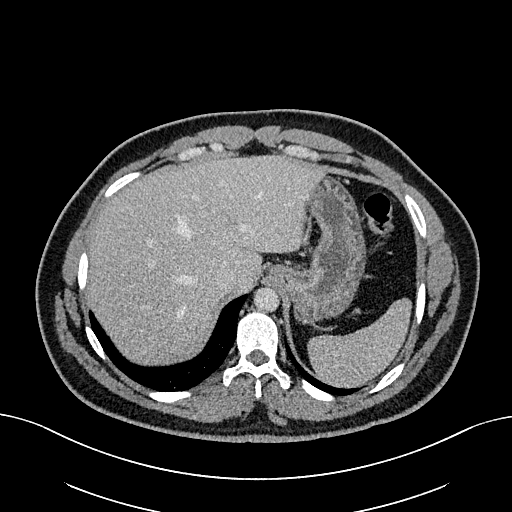
[im 28/139  lung]
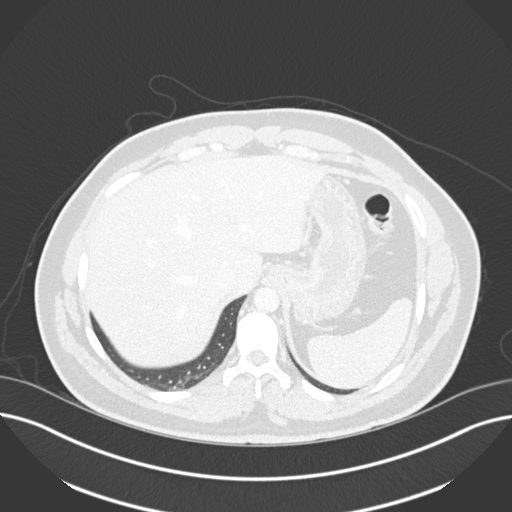
[im 56/139  lung]
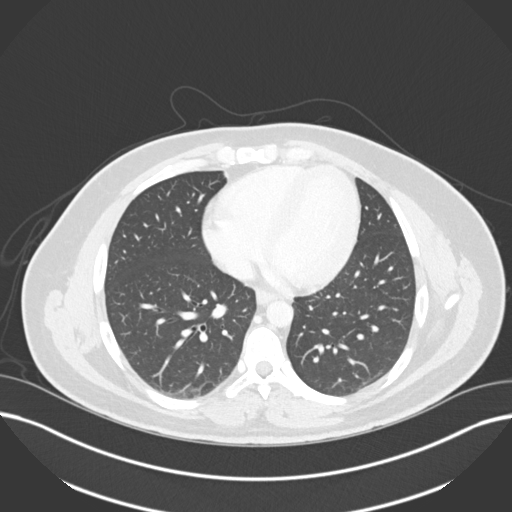
[im 83/139  lung]
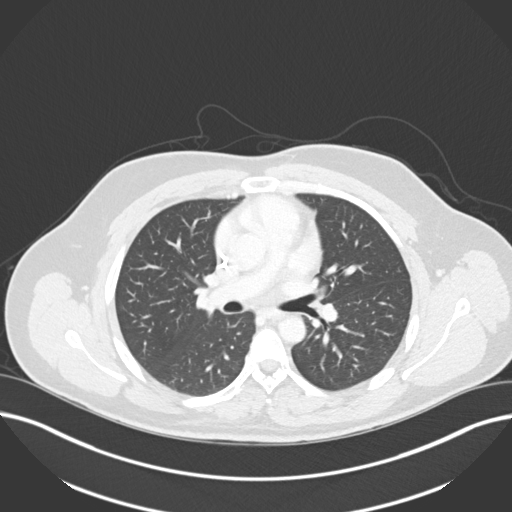
[im 111/139  lung]
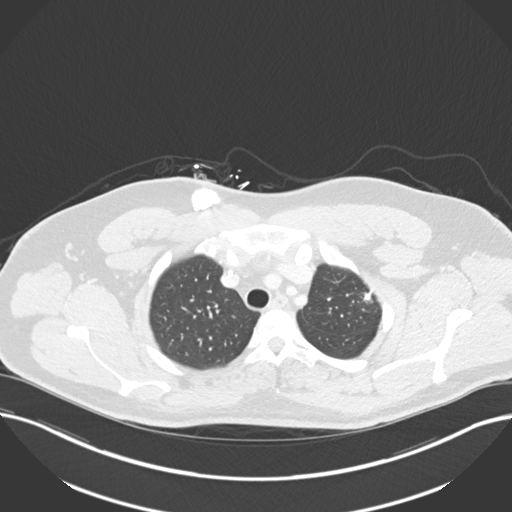

[Series 5: thins · axial · 0.80mm/px · z∈[-259,-51]mm · 8 of 514 slices shown]
[im 49/514  lung]
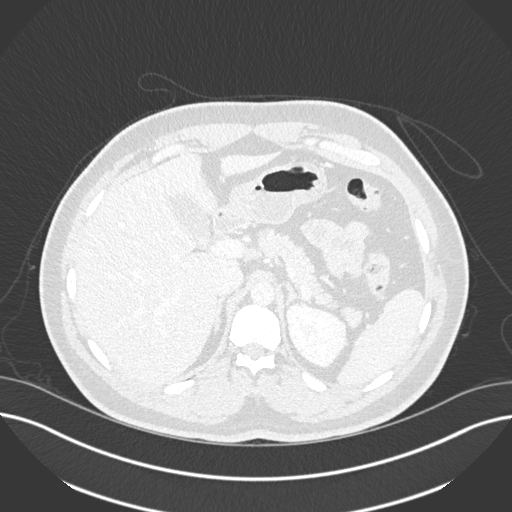
[im 98/514  lung]
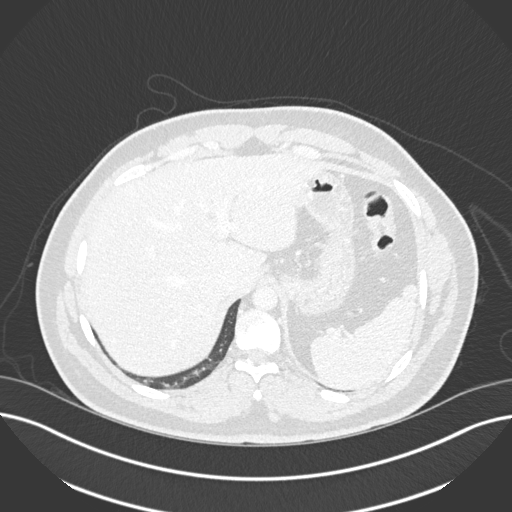
[im 172/514  lung]
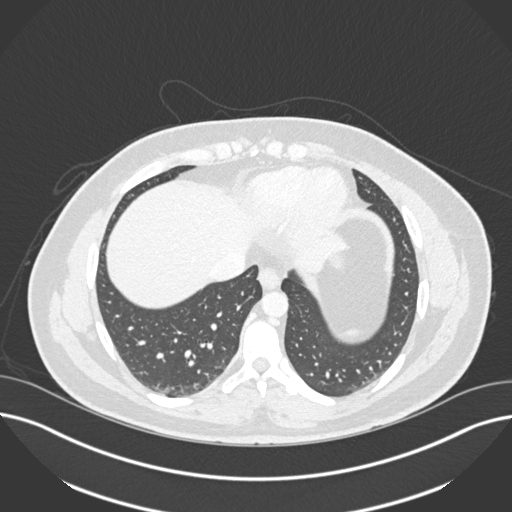
[im 220/514  lung]
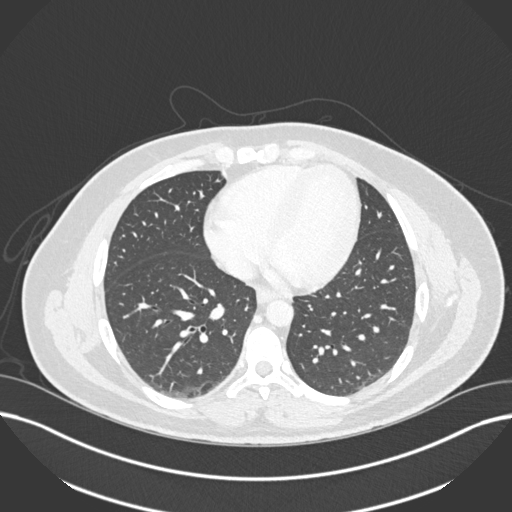
[im 294/514  lung]
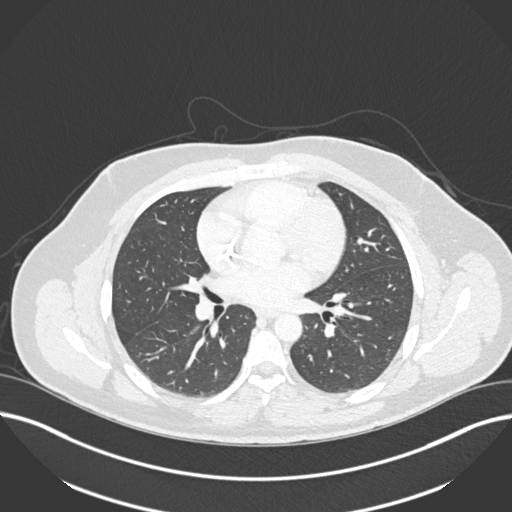
[im 343/514  lung]
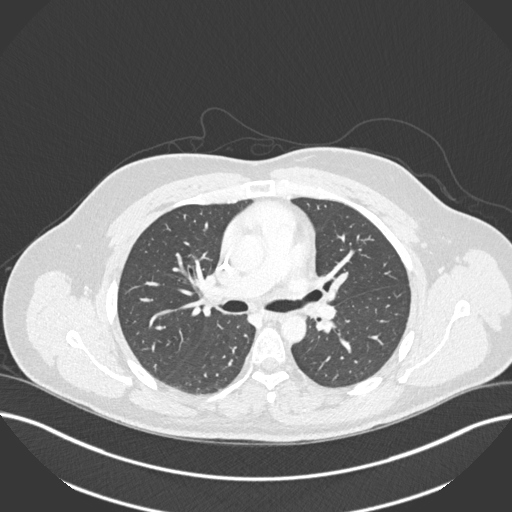
[im 416/514  lung]
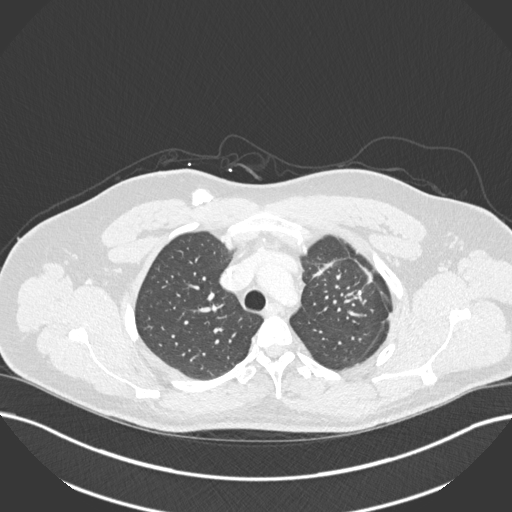
[im 465/514  lung]
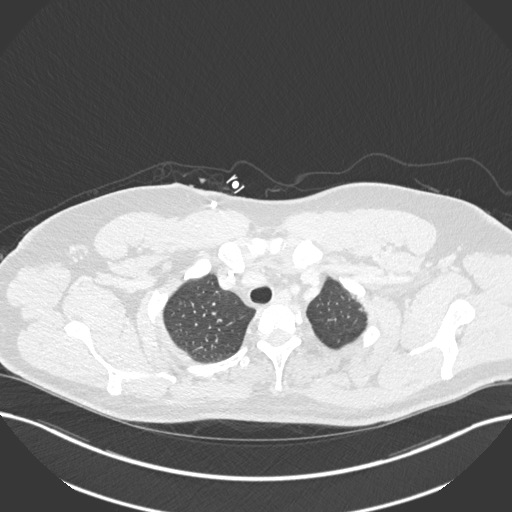

[Series 6: coronal · coronal · 0.56mm/px · 3 of 134 slices shown]
[im 27/134  lung]
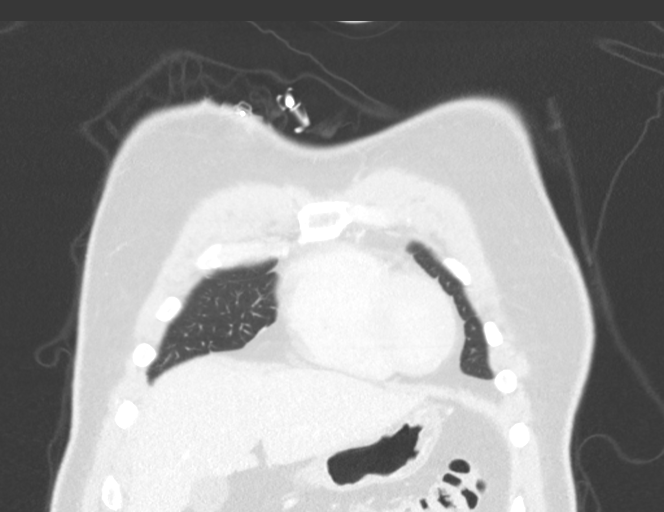
[im 54/134  lung]
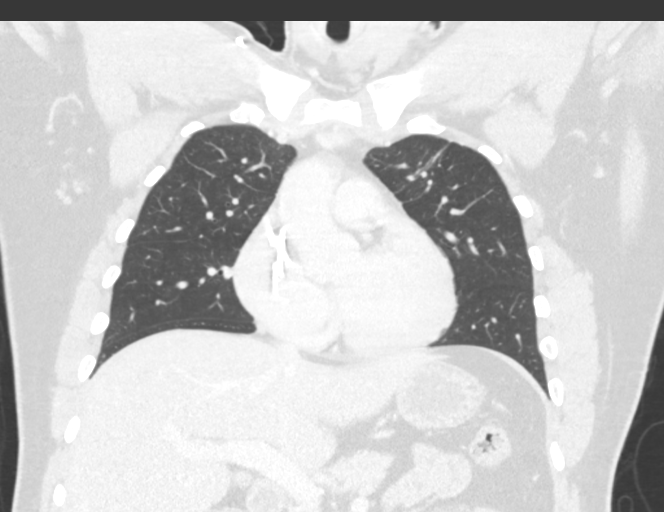
[im 80/134  lung]
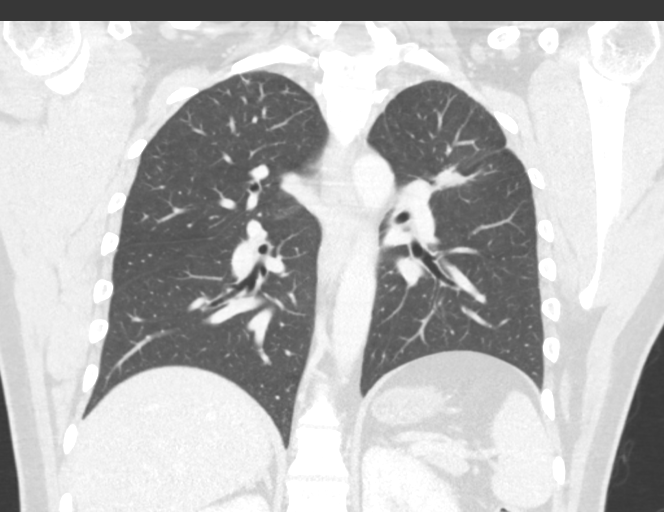

[15 of 36 positions shown; findings below may reference images not displayed]

FINDINGS: Cardiovascular: Right IJ Port-A-Cath terminates in the right atrium.
Heart is enlarged. No pericardial effusion.

Mediastinum/Nodes: No pathologically enlarged mediastinal, hilar or
axillary lymph nodes. Esophagus is unremarkable.

Lungs/Pleura: Scarring in the apices. Post treatment parenchymal
retraction, bronchiectasis and architectural distortion in the
perihilar left upper and left lower lobes. 5 mm peripheral left
lower lobe nodule (1/76) and 4 mm peripheral right lower lobe nodule
(1/79), unchanged. No pleural fluid. Airway is unremarkable.

Upper Abdomen: Visualized portions of the liver, gallbladder,
adrenal glands, kidneys, spleen, pancreas, stomach and bowel are
grossly unremarkable.

Musculoskeletal: No worrisome lytic or sclerotic lesions.
IMPRESSION: Post treatment scarring in the left hemithorax, stable. No evidence
recurrent or metastatic disease.

## 2021-10-24 ENCOUNTER — Inpatient Hospital Stay: Payer: Medicaid Other | Attending: Hematology and Oncology

## 2021-10-24 ENCOUNTER — Other Ambulatory Visit (HOSPITAL_COMMUNITY): Payer: Self-pay

## 2021-10-24 ENCOUNTER — Inpatient Hospital Stay (HOSPITAL_BASED_OUTPATIENT_CLINIC_OR_DEPARTMENT_OTHER): Payer: Medicaid Other | Admitting: Hematology and Oncology

## 2021-10-24 ENCOUNTER — Other Ambulatory Visit: Payer: Self-pay

## 2021-10-24 ENCOUNTER — Inpatient Hospital Stay: Payer: Medicaid Other

## 2021-10-24 DIAGNOSIS — C760 Malignant neoplasm of head, face and neck: Secondary | ICD-10-CM

## 2021-10-24 DIAGNOSIS — M5382 Other specified dorsopathies, cervical region: Secondary | ICD-10-CM | POA: Diagnosis not present

## 2021-10-24 DIAGNOSIS — Z79899 Other long term (current) drug therapy: Secondary | ICD-10-CM | POA: Insufficient documentation

## 2021-10-24 DIAGNOSIS — G893 Neoplasm related pain (acute) (chronic): Secondary | ICD-10-CM | POA: Diagnosis not present

## 2021-10-24 DIAGNOSIS — Z7189 Other specified counseling: Secondary | ICD-10-CM

## 2021-10-24 DIAGNOSIS — E039 Hypothyroidism, unspecified: Secondary | ICD-10-CM | POA: Diagnosis not present

## 2021-10-24 DIAGNOSIS — C7800 Secondary malignant neoplasm of unspecified lung: Secondary | ICD-10-CM

## 2021-10-24 DIAGNOSIS — Z5112 Encounter for antineoplastic immunotherapy: Secondary | ICD-10-CM | POA: Diagnosis present

## 2021-10-24 DIAGNOSIS — Z95828 Presence of other vascular implants and grafts: Secondary | ICD-10-CM

## 2021-10-24 LAB — CBC WITH DIFFERENTIAL (CANCER CENTER ONLY)
Abs Immature Granulocytes: 0.01 10*3/uL (ref 0.00–0.07)
Basophils Absolute: 0 10*3/uL (ref 0.0–0.1)
Basophils Relative: 1 %
Eosinophils Absolute: 0.1 10*3/uL (ref 0.0–0.5)
Eosinophils Relative: 2 %
HCT: 40.2 % (ref 39.0–52.0)
Hemoglobin: 14.1 g/dL (ref 13.0–17.0)
Immature Granulocytes: 0 %
Lymphocytes Relative: 30 %
Lymphs Abs: 1.8 10*3/uL (ref 0.7–4.0)
MCH: 30.2 pg (ref 26.0–34.0)
MCHC: 35.1 g/dL (ref 30.0–36.0)
MCV: 86.1 fL (ref 80.0–100.0)
Monocytes Absolute: 0.4 10*3/uL (ref 0.1–1.0)
Monocytes Relative: 7 %
Neutro Abs: 3.6 10*3/uL (ref 1.7–7.7)
Neutrophils Relative %: 60 %
Platelet Count: 219 10*3/uL (ref 150–400)
RBC: 4.67 MIL/uL (ref 4.22–5.81)
RDW: 14.5 % (ref 11.5–15.5)
WBC Count: 6.1 10*3/uL (ref 4.0–10.5)
nRBC: 0 % (ref 0.0–0.2)

## 2021-10-24 LAB — CMP (CANCER CENTER ONLY)
ALT: 28 U/L (ref 0–44)
AST: 22 U/L (ref 15–41)
Albumin: 4.5 g/dL (ref 3.5–5.0)
Alkaline Phosphatase: 49 U/L (ref 38–126)
Anion gap: 10 (ref 5–15)
BUN: 14 mg/dL (ref 6–20)
CO2: 26 mmol/L (ref 22–32)
Calcium: 9.4 mg/dL (ref 8.9–10.3)
Chloride: 101 mmol/L (ref 98–111)
Creatinine: 0.91 mg/dL (ref 0.61–1.24)
GFR, Estimated: >60 mL/min (ref >60)
Glucose, Bld: 175 mg/dL — ABNORMAL HIGH (ref 70–99)
Potassium: 3.6 mmol/L (ref 3.5–5.1)
Sodium: 137 mmol/L (ref 135–145)
Total Bilirubin: 0.4 mg/dL (ref 0.3–1.2)
Total Protein: 7.1 g/dL (ref 6.5–8.1)

## 2021-10-24 LAB — TSH: TSH: 1.394 u[IU]/mL (ref 0.320–4.118)

## 2021-10-24 MED ORDER — SODIUM CHLORIDE 0.9% FLUSH
10.0000 mL | INTRAVENOUS | Status: DC | PRN
  Administered 2021-10-24: 10 mL

## 2021-10-24 MED ORDER — OXYCODONE HCL 5 MG PO TABS
5.0000 mg | ORAL_TABLET | Freq: Three times a day (TID) | ORAL | 0 refills | Status: DC | PRN
  Filled 2021-10-24: qty 90, 30d supply, fill #0

## 2021-10-24 MED ORDER — METFORMIN HCL 500 MG PO TABS
500.0000 mg | ORAL_TABLET | Freq: Two times a day (BID) | ORAL | 2 refills | Status: DC
  Filled 2021-10-24: qty 60, 30d supply, fill #0
  Filled 2021-12-01: qty 60, 30d supply, fill #1
  Filled 2022-01-07: qty 60, 30d supply, fill #2

## 2021-10-24 MED ORDER — ONDANSETRON HCL 4 MG/2ML IJ SOLN
8.0000 mg | Freq: Once | INTRAMUSCULAR | Status: AC
  Administered 2021-10-24: 8 mg via INTRAVENOUS
  Filled 2021-10-24: qty 4

## 2021-10-24 MED ORDER — SODIUM CHLORIDE 0.9 % IV SOLN
400.0000 mg | Freq: Once | INTRAVENOUS | Status: AC
  Administered 2021-10-24: 400 mg via INTRAVENOUS
  Filled 2021-10-24: qty 16

## 2021-10-24 MED ORDER — ANTICOAGULANT SODIUM CITRATE LOCK FLUSH 4% (120 MG/3ML) IV SOLN
5.0000 mL | PREFILLED_SYRINGE | Freq: Once | INTRAVENOUS | Status: AC
  Administered 2021-10-24: 5 mL
  Filled 2021-10-24: qty 6

## 2021-10-24 MED ORDER — SODIUM CHLORIDE 0.9 % IV SOLN: Freq: Once | INTRAVENOUS | Status: AC

## 2021-10-24 MED ORDER — SODIUM CHLORIDE 0.9% FLUSH
10.0000 mL | Freq: Once | INTRAVENOUS | Status: AC
  Administered 2021-10-24: 10 mL

## 2021-10-25 LAB — T4: T4, Total: 5.8 ug/dL (ref 4.5–12.0)

## 2021-10-26 ENCOUNTER — Encounter: Payer: Self-pay | Admitting: Hematology and Oncology

## 2021-10-26 DIAGNOSIS — M5382 Other specified dorsopathies, cervical region: Secondary | ICD-10-CM | POA: Insufficient documentation

## 2021-10-26 NOTE — Assessment & Plan Note (Signed)
Clinically, his examination is benign There is no role for CT imaging at this point He will continue pembrolizumab every 6 weeks We will continue to monitor closely for toxicity

## 2021-10-26 NOTE — Progress Notes (Signed)
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[place]) Clinically, his examination is benign There is no role for CT imaging at this point He will continue pembrolizumab every 6 weeks We will continue to monitor closely for toxicity  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 He is tolerating oxycodone taper, will continue to work with him to reduce pain medicine requirement  Acquired hypothyroidism His TSH is intermittently elevated He does not need dose adjustment but we will monitor that closely  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 The patient knows to call the clinic with any problems, questions or concerns. The total time spent in the appointment was 20 minutes encounter with patients including review of chart and various tests results, discussions about plan of care and coordination of care plan   [name], MD 10/26/2021 1:27 PM  INTERVAL HISTORY: Please see below for problem oriented charting. he returns for treatment follow-up on  maintenance pembrolizumab His neck pain is slightly better and he is using less pain medicine He has noticed changes in posture and difficulties with neck movement  REVIEW OF SYSTEMS:   Constitutional: Denies fevers, chills or abnormal weight loss Eyes: Denies blurriness of vision Ears, nose, mouth, throat, and face: Denies mucositis or sore throat Respiratory: Denies cough, dyspnea or wheezes Cardiovascular: Denies palpitation, chest discomfort or lower extremity swelling Gastrointestinal:  Denies nausea, heartburn or change in bowel habits Skin: Denies abnormal skin rashes Lymphatics: Denies new lymphadenopathy or easy bruising Neurological:Denies numbness, tingling or new weaknesses Behavioral/Psych: Mood is stable, no new changes  All other systems were reviewed with the patient and are negative.  I have reviewed the past medical history, past surgical history, social history and family history with the patient and they are unchanged from previous note.  ALLERGIES:  is allergic to phenergan [promethazine hcl], heparin, and clindamycin.  MEDICATIONS:  Current Outpatient Medications  Medication Sig Dispense Refill   Accu-Chek Softclix Lancets lancets CHECK BLOOD GLUCOSE ONCE A DAY IN THE MORNING. 100 each 12   blood glucose meter kit and supplies KIT Dispense based on patient and insurance preference. Use up to four times daily as directed. (FOR ICD-9 250.00, 250.01). 1 each 1   Blood Glucose Monitoring Suppl (ACCU-CHEK GUIDE) w/Device KIT USE ONCE DAILY AS DIRECTED IN THE MORNING 1 kit 0   cetirizine (ZYRTEC) 10 MG tablet Take 1 tablet (10 mg total) by mouth daily as needed for allergies. 100 tablet 0   glucose blood test strip CHECK BLOOD GLUCOSE ONCE A DAY IN THE MORNING 100 strip 12   levothyroxine (SYNTHROID)  125 MCG tablet Take 1 tablet (125 mcg total) by mouth daily before breakfast. 60 tablet 1   metFORMIN (GLUCOPHAGE) 500 MG tablet Take 1 tablet (500 mg total) by mouth 2  (two) times daily with a meal. 60 tablet 2   ondansetron (ZOFRAN) 8 MG tablet Take 1 tablet (8 mg total) by mouth every 8 (eight) hours as needed for nausea or vomiting. 90 tablet 1   oxyCODONE (OXY IR/ROXICODONE) 5 MG immediate release tablet Take 1 tablet (5 mg total) by mouth 3 (three) times daily as needed for severe pain. 90 tablet 0   No current facility-administered medications for this visit.   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 Pan-endoscopy elsewhere showed cancer from nasopharyngeal space.   01/04/2007 - 02/20/2007 Chemotherapy   He received 2 cycles of cisplatin and 5FU followed by concurrent chemo with weekly cisplatin and radiation. He only received 2 doses of chemo due to severe mucositis, nausea and weight loss.   04/05/2007 - 08/04/2007 Chemotherapy   He received 4 more courses of cisplatin with 5FU and had complete response   07/05/2009 Procedure   Fine-needle aspirate of the right level II lymph nodes come from recurrent metastatic disease. Repeat endoscopy and CT scan show no evidence of disease elsewhere.   07/08/2009 - 12/02/2009 Chemotherapy   He was given 6 cycles of carboplatin,  5-FU and docetaxel   12/03/2009 Surgery   He has surgery to the residual lymph node on the right neck which showed no evidence of disease.   02/22/2012 Imaging   Repeat imaging study showed large recurrent mass. He was referred elsewhere for further treatment.   05/03/2012 Surgery   He underwent left upper lobectomy.   04/29/2013 Imaging   PEt scan showed lesion on right level II B and lower lung was abnormal   06/03/2013 - 02/02/2014 Chemotherapy   He had 6 cycles of chemotherapy when he was found to have recurrence of cancer and had received oxaliplatin and capecitabine   06/07/2014 Imaging   PET CT scan showed persistent disease in the right neck lymph nodes and left lung   06/29/2014 Procedure   Accession: [id number] repeat LUL biopsy confirmed metastatic cancer   07/18/2014 - 07/31/2014 Radiation Therapy   He received palliative radiation therapy to the lungs   10/10/2014 Imaging   CT scan of the chest, abdomen and pelvis show regression in the size of the lung nodule in the left upper lobe and stable pulmonary nodules   01/24/2015 Imaging   CT scan showed stable disease in neck and lung   06/19/2015 Imaging   CT scan of the neck  and the chest show possible mild progression of the nodule in the right side of the neck.   06/25/2015 Imaging   PET scan confirmed disease recurrence in the neck   07/07/2015 Imaging   He had [facility] neck [facility]   09/03/2015 - 08/26/2018 Chemotherapy   He received palliative chemo with Nivolumab   10/29/2015 Imaging   PET CT showed positive response to Rx   02/28/2016 Imaging   Ct abdomen showed abnormal thinkening in his stomach   03/03/2016 Imaging   CT: Right sternocleidomastoid muscle metastasis appears less distinct but otherwise not significantly changed in size or configuration since 06/19/2015.2. Left level 3 lymph node which was hypermetabolic by PET-CT in January 2017 appears slightly smaller   04/01/2016 Imaging   CT cervical spine showed no  acute fracture or traumatic malalignment in the cervical spine   04/22/2016 Procedure   Port-a-cath placed.   06/16/2016 Imaging   Ct neck showed right sternocleidomastoid muscle metastasis is further decreased in conspicuity since May, and has mildly decreased in size since September 2016. Continued stability of sub-centimeter left cervical lymph nodes. No new or progressive metastatic disease in the neck.   06/16/2016 Imaging   CT chest showed stable masslike radiation fibrosis in the left upper lobe. Stable subcentimeter pulmonary nodules in the bilateral lower lobes. No new or progressive metastatic disease in the chest. Nonobstructing left renal stone.   10/13/2016 Imaging   Ct neck showed unchanged right sternocleidomastoid muscle metastasis. Unchanged subcentimeter left cervical lymph nodes. No evidence of new or progressive metastatic disease in the neck.   10/13/2016 Imaging   CT chest showed tiny hypervascular foci in the liver, not definitely seen on prior imaging of 06/16/2016 and 02/28/2016. Abdomen MRI without and with contrast recommended to further evaluate as metastatic disease is a concern. 2. Stable appearance of post treatment changes left upper lung and scattered tiny bilateral pulmonary nodules.   02/11/2017 Imaging   Ct neck: Lymph node mass right posterior neck appears improved from the prior study. Small posterior lymph nodes on the left unchanged. Occluded right jugular vein unchanged.   02/11/2017 Imaging   1. Similar appearance of postsurgical and radiation changes in the left upper lobe. 2. Similar bilateral pulmonary nodules. 3. No thoracic adenopathy. 4. Subtle foci of post-contrast enhancement within the liver are suboptimally characterized on this nondedicated study. Likely similar. These could either be re-evaluated at followup or more entirely characterized with abdominal MRI. 5. Left nephrolithiasis.   05/19/2017 Imaging   Matted lymph node mass right posterior  neck appears larger in the recent CT. Accurate measurements difficult due to infiltrating tumor margins and infiltration of the muscle. Right jugular vein again appears occluded or resected. Small left posterior lymph nodes stable. Left upper lobe airspace density stable and similar to the prior CT   06/03/2017 PET scan   1. Hypermetabolic ill-defined right level IIb lymph node, about 1.3 cm in diameter with maximum SUV 9.5 (formerly 8.1). Appearance suspicious for residual/recurrent malignancy. No worrisome left-sided lesion. 2. Left suprahilar indistinct opacity demonstrates no worrisome hypermetabolic activity. The 5 mm left lower lobe pulmonary nodule is stable and not currently hypermetabolic although below sensitive PET-CT size thresholds. 3. Other imaging findings of potential clinical significance: Bilateral nonobstructive nephrolithiasis. Chronic bilateral maxillary sinusitis.   05/11/2018 PET scan   1. Continued chronic accentuated metabolic activity in the vicinity of right level IIB and the adjacent right sternocleidomastoid muscle, with ill definition of surrounding tissue planes. Maximum SUV is currently  8.1, formerly 9.5. Accentuated metabolic activity is been present in this vicinity back through 06/25/2015, and there was also some low-level activity in this vicinity on 06/07/2014. Some of this may be from scarring and local muscular activity although clearly a component of residual tumor is difficult to exclude given the focally high activity. 2. Other imaging findings of potential clinical significance: Chronic bilateral maxillary sinusitis. Chronic scarring in the left upper lobe. Chronically stable 5 mm left lower lobe nodule is considered benign. Nonobstructive left nephrolithiasis.   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 COMMENT:No  significant keratinization is identified. Other basaloid carcinomas are in the differential diagnosis. No cell block material is available for further testing.   09/12/2018 Procedure   He underwent fine Needle Aspiration   10/04/2018 PET scan   1. Significant progression of local recurrence laterally in the mid right neck with an enlarging, increasingly hypermetabolic soft tissue mass. This involves the right sternocleidomastoid muscle. 2. Small lymph nodes in the right axilla are increasingly hypermetabolic. These are nonspecific and potentially reactive, although could reflect a small metastases. Small hypermetabolic nodule in the left suprasternal notch is unchanged. 3. No other evidence of metastatic disease.     10/07/2018 - 12/23/2018 Chemotherapy   The patient had cisplatin plus gemzar   12/07/2018 Imaging   1. Decreased size of lateral right neck mass. 2. Unchanged soft tissue nodule in the suprasternal notch. 3. No evidence of new metastatic disease in the neck.     05/30/2019 Imaging   CT neck No clear change or progression compared to the study of March. Overall measurements of the right lateral neck mass are similar, approximately 3 x 1.8 cm. See above discussion. One could argue that there is slight increase in lateral bulging, possibly with an increase in contrast enhancement, towards the inferior margin. This is of questionable validity but could possibly represent some progression or inflammatory change. Other findings in the region are stable.   07/20/2019 - 09/15/2019 Chemotherapy   The patient had dexamethasone (DECADRON) 4 MG tablet, 1 of 1 cycle, Start date: --, End date: -- palonosetron (ALOXI) injection 0.25 mg, 0.25 mg, Intravenous,  Once, 3 of 4 cycles Administration: 0.25 mg (07/20/2019), 0.25 mg (08/10/2019), 0.25 mg (09/08/2019) CISplatin (PLATINOL) 84 mg in sodium chloride 0.9 % 250 mL chemo infusion, 40 mg/m2 = 84 mg (80 % of original dose 50 mg/m2), Intravenous,   Once, 3 of 4 cycles Dose modification: 40 mg/m2 (80 % of original dose 50 mg/m2, Cycle 1, Reason: Dose Not Tolerated) Administration: 84 mg (07/20/2019), 84 mg (08/10/2019), 83 mg (09/08/2019) gemcitabine (GEMZAR) 1,600 mg in sodium chloride 0.9 % 250 mL chemo infusion, 1,672 mg (80 % of original dose 1,000 mg/m2), Intravenous,  Once, 3 of 4 cycles Dose modification: 800 mg/m2 (80 % of original dose 1,000 mg/m2, Cycle 1, Reason: Provider Judgment) Administration: 1,600 mg (07/20/2019), 1,600 mg (07/27/2019), 1,600 mg (08/10/2019), 1,600 mg (08/17/2019), 1,600 mg (09/08/2019), 1,672 mg (09/15/2019) ondansetron (ZOFRAN) 8 mg, dexamethasone (DECADRON) 10 mg in sodium chloride 0.9 % 50 mL IVPB, , Intravenous,  Once, 3 of 4 cycles Administration:  (09/15/2019) fosaprepitant (EMEND) 150 mg, dexamethasone (DECADRON) 12 mg in sodium chloride 0.9 % 145 mL IVPB, , Intravenous,  Once, 3 of 4 cycles Administration:  (07/20/2019),  (08/10/2019),  (09/08/2019)   for chemotherapy treatment.     10/02/2019  Imaging   CT neck As compared to 05/30/2019, no significant interval change in size of an ill-defined mass within the right lateral neck, again measuring 3.3 x 1.8 cm in transaxial dimensions.   Unchanged mildly enlarged left level I lymph node measuring 1.1 cm in short axis.   Unchanged node or nodule at the thoracic inlet, measuring 1.3 x 0.8 cm.   Please refer to concurrently performed chest CT for a description of findings below the level of the thoracic inlet.     10/02/2019 Imaging   CT chest 1. No new or progressive findings in the chest to suggest metastatic disease. 2. Bilateral subcentimeter solid pulmonary nodules are stable since 2018. 3. Hyperdense 1.1 cm anterior liver focus, not clearly visualized on prior studies. Suggest MRI abdomen without and with IV contrast for further characterization.   10/20/2019 - 12/29/2019 Chemotherapy   The patient had ondansetron (ZOFRAN) injection 8 mg, 8 mg  (100 % of original dose 8 mg), Intravenous,  Once, 2 of 5 cycles Dose modification: 8 mg (original dose 8 mg, Cycle 2) Administration: 8 mg (11/17/2019), 8 mg (12/15/2019), 8 mg (12/29/2019) gemcitabine (GEMZAR) 2,000 mg in sodium chloride 0.9 % 250 mL chemo infusion, 2,090 mg, Intravenous,  Once, 3 of 6 cycles Administration: 2,000 mg (10/20/2019), 2,000 mg (11/03/2019), 2,000 mg (11/17/2019), 2,000 mg (12/15/2019), 2,000 mg (12/29/2019)   for chemotherapy treatment.     06/12/2020 Imaging   1. Enlarging superficial, exophytic component of the chronic right sternocleidomastoid muscle mass. See series 6, image 55. 2. Elsewhere stable CT appearance of the Neck.   06/12/2020 Imaging   Post treatment scarring in the left hemithorax, stable. No evidence recurrent or metastatic disease   06/14/2020 - 10/15/2020 Chemotherapy   He received carboplatin, 5FU and Beryle Flock        10/31/2020 Procedure   Interval improvement in right lateral lymph node mass. Improvement in dermal component as well as invasion of the right sternocleidomastoid muscle.   10 mm submental lymph node slightly enlarged compared to the prior study. Continued follow-up recommended.   11/01/2020 -  Chemotherapy   Patient is on Treatment Plan : HEAD/NECK Pembrolizumab Q21D       PHYSICAL EXAMINATION: ECOG PERFORMANCE STATUS: 0 - Asymptomatic  Vitals:   10/24/21 1201  BP: 130/81  Pulse: 90  Resp: 17  Temp: 98.4 F (36.9 C)  SpO2: 95%   Filed Weights   10/24/21 1201  Weight: 181 lb 9.6 oz (82.4 kg)    GENERAL:alert, no distress and comfortable SKIN: skin color, texture, turgor are normal, no rashes or significant lesions EYES: normal, Conjunctiva are pink and non-injected, sclera clear OROPHARYNX:no exudate, no erythema and lips, buccal mucosa, and tongue normal  NECK: noted neck fibrosis but no signs of active cancer LYMPH:  no palpable lymphadenopathy in the cervical, axillary or inguinal LUNGS: clear to auscultation  and percussion with normal breathing effort HEART: regular rate & rhythm and no murmurs and no lower extremity edema ABDOMEN:abdomen soft, non-tender and normal bowel sounds Musculoskeletal:no cyanosis of digits and no clubbing  NEURO: alert & oriented x 3 with fluent speech, no focal motor/sensory deficits  LABORATORY DATA:  I have reviewed the data as listed    Component Value Date/Time   NA 137 10/24/2021 1139   NA 139 09/22/2017 0829   K 3.6 10/24/2021 1139   K 3.5 09/22/2017 0829   CL 101 10/24/2021 1139   CO2 26 10/24/2021 1139   CO2 26 09/22/2017 0829  GLUCOSE 175 (H) 10/24/2021 1139   GLUCOSE 133 09/22/2017 0829   BUN 14 10/24/2021 1139   BUN 14.1 09/22/2017 0829   CREATININE 0.91 10/24/2021 1139   CREATININE 0.9 09/22/2017 0829   CALCIUM 9.4 10/24/2021 1139   CALCIUM 9.1 09/22/2017 0829   PROT 7.1 10/24/2021 1139   PROT 6.8 09/22/2017 0829   ALBUMIN 4.5 10/24/2021 1139   ALBUMIN 4.1 09/22/2017 0829   AST 22 10/24/2021 1139   AST 22 09/22/2017 0829   ALT 28 10/24/2021 1139   ALT 30 09/22/2017 0829   ALKPHOS 49 10/24/2021 1139   ALKPHOS 55 09/22/2017 0829   BILITOT 0.4 10/24/2021 1139   BILITOT 0.35 09/22/2017 0829   GFRNONAA >60 10/24/2021 1139   GFRAA >60 07/05/2020 0845   GFRAA >60 02/06/2019 1215    No results found for: SPEP, UPEP  Lab Results  Component Value Date   WBC 6.1 10/24/2021   NEUTROABS 3.6 10/24/2021   HGB 14.1 10/24/2021   HCT 40.2 10/24/2021   MCV 86.1 10/24/2021   PLT 219 10/24/2021      Chemistry      Component Value Date/Time   NA 137 10/24/2021 1139   NA 139 09/22/2017 0829   K 3.6 10/24/2021 1139   K 3.5 09/22/2017 0829   CL 101 10/24/2021 1139   CO2 26 10/24/2021 1139   CO2 26 09/22/2017 0829   BUN 14 10/24/2021 1139   BUN 14.1 09/22/2017 0829   CREATININE 0.91 10/24/2021 1139   CREATININE 0.9 09/22/2017 0829      Component Value Date/Time   CALCIUM 9.4 10/24/2021 1139   CALCIUM 9.1 09/22/2017 0829   ALKPHOS 49  10/24/2021 1139   ALKPHOS 55 09/22/2017 0829   AST 22 10/24/2021 1139   AST 22 09/22/2017 0829   ALT 28 10/24/2021 1139   ALT 30 09/22/2017 0829   BILITOT 0.4 10/24/2021 1139   BILITOT 0.35 09/22/2017 0829

## 2021-10-26 NOTE — Assessment & Plan Note (Signed)
His TSH is intermittently elevated He does not need dose adjustment but we will monitor that closely

## 2021-10-26 NOTE — Assessment & Plan Note (Signed)
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

## 2021-10-26 NOTE — Assessment & Plan Note (Signed)
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

## 2021-10-28 ENCOUNTER — Other Ambulatory Visit (HOSPITAL_COMMUNITY): Payer: Self-pay

## 2021-11-04 ENCOUNTER — Ambulatory Visit: Payer: Medicaid Other | Attending: Hematology and Oncology | Admitting: Rehabilitation

## 2021-11-04 ENCOUNTER — Other Ambulatory Visit: Payer: Self-pay

## 2021-11-04 ENCOUNTER — Encounter: Payer: Self-pay | Admitting: Rehabilitation

## 2021-11-04 DIAGNOSIS — C760 Malignant neoplasm of head, face and neck: Secondary | ICD-10-CM | POA: Insufficient documentation

## 2021-11-04 DIAGNOSIS — M436 Torticollis: Secondary | ICD-10-CM | POA: Insufficient documentation

## 2021-11-04 DIAGNOSIS — M5382 Other specified dorsopathies, cervical region: Secondary | ICD-10-CM | POA: Insufficient documentation

## 2021-11-04 DIAGNOSIS — R29898 Other symptoms and signs involving the musculoskeletal system: Secondary | ICD-10-CM | POA: Diagnosis present

## 2021-11-04 NOTE — Therapy (Signed)
[facility] @ [place], [place], [address] Phone: [phone]   Fax:  [phone]  Physical Therapy Evaluation  Patient Details  Name: Christian Simmons MRN: [id number] Date of Birth: 20-May-1986 Referring Provider (PT): Dr. 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, unsp ([place]) 03/02/2019   Fatigue 06/01/2014   Hypothyroidism    Infection of eyelash follicle of left eye 10/05/7508   Metastasis to lung ([place]) 06/01/2014   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    Subjective Assessment - 11/04/21 0810     Subjective Pain is the biggest thing and being uncomfortable on the Rt neck.  It started being able to not move it well after surgery in [place] 2011.    Pertinent History Nasopharyngeal carcinoma diagnosed in 2008.  Chemotherapy completed.  Recurrence in 2010 lymph node with more chemotherapy.  Removal of Rt neck lymph node showing NED.  Recurrence 2013 with Lt upper lobectomy and then Rt lung lesion found 2014 with more chemotherapy (all previous treatment in [place]) .  Palliative radiation to lungs 2015. Palliative chemotherapy 2016-2019. (In [place])  2019: local recurrence Rt SCM and lymph nodes. Continuation of chemotherapy. Keytruda every 6 weeks.    Limitations Lifting    Patient Stated Goals improve shoulder and neck movement    Currently in Pain? Yes   unrated today   Pain Location Neck    Pain Orientation Right    Pain Descriptors / Indicators Discomfort    Pain Type Chronic pain    Pain Onset More than a month ago    Pain Frequency Constant    Aggravating Factors  just always hurts - a bit worse with movement                [facility] PT Assessment - 11/04/21 0001       Assessment   Medical Diagnosis nasopharynx cancer    Referring Provider (PT) Dr. Alvy Bimler    Hand Dominance Right    Prior Therapy yes      Restrictions   Weight Bearing Restrictions No      Balance Screen   Has the patient fallen in the past 6  months No    Has the patient had a decrease in activity level because of a fear of falling?  No    Is the patient reluctant to leave their home because of a fear of falling?  No      Home Environment   Living Environment Private residence    Living Arrangements Spouse/significant other;Children    Available Help at Discharge Family    Type of [place]      Prior Function   Level of Independence Independent    Vocation Full time employment    Vocation Requirements driving; uber/lyft/delivery    Leisure no      Cognition   Overall Cognitive Status Within Functional Limits for tasks assessed      Observation/Other Assessments   Observations visible tightness of sternocleidomastoid muscle on the Rt - port in place.  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       [facility] Adult PT Treatment/Exercise - 11/04/21 0001       Self-Care   Self-Care Posture    Posture lengthy discussion about postural changes to make with photos of patient for feedback, vcs, tcs, and demo.  Pt feeling like his correct posture is abnormal most likelydue to radiation hx to the Left ribcage and tightneing of the diaphragm and intercostals.  Discussed working on this during the day in addition to other new HEP activities.                     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    Person(s) Educated Patient    Methods Explanation;Demonstration;Tactile cues;Verbal cues;Handout    Comprehension Verbalized understanding;Returned demonstration;Verbal cues required;Tactile cues required;Need further instruction                 PT Long Term Goals - 11/04/21 0912       PT LONG TERM GOAL #1   Title Pt will demonstrate appropriate sitting posture without need for vcs/tcs    Time 6    Period Weeks    Status New      PT LONG TERM GOAL #2   Title Pt will improve Rt  shoulder abduction to at least 90deg to improve mobility    Baseline 60  Time 6    Period Weeks    Status New      PT LONG TERM GOAL #3   Title Pt will be ind with final HEP for continued strengthening of the shoulder and postural muscles    Time 6    Period Weeks    Status New                    Plan - 11/04/21 0907     Clinical Impression Statement Pt returns from many years ago with 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  Pt seems to have had more involved radiation as it was performed in [place] and did not sound similar to practices here. Shoulder AROM has not improved and has decreased slightly into abduction from measurements here in 2016.  See photos for scapular position into flexion and abduction.  Neck AROM is also more limited from prior visits due to fibrosis.  Focused today on postural education with difficulty even holding correct posture.  Will start PT to attend to improve neck mobility, improve shoulder and scapular strength, and to improve posture.    Personal Factors and Comorbidities Comorbidity 3+;Time since onset of injury/illness/exacerbation;Past/Current Experience    Comorbidities radiation multiple times, multiple surgeries,    Examination-Activity Limitations Reach Overhead;Lift    Examination-Participation Restrictions Yard Work;Community Activity    Stability/Clinical Decision Making Stable/Uncomplicated    Clinical Decision Making Low    Rehab Potential Fair    PT Frequency 2x / week    PT Duration 6 weeks    PT Treatment/Interventions Therapeutic exercise;ADLs/Self Care Home Management;Scar mobilization;Passive range of motion;Dry needling;Patient/family education;Electrical Stimulation;Moist Heat;Manual techniques;Therapeutic activities;Taping    PT Next Visit Plan postural education focus to start, chin tucks, meeks/etc.,  start Rt shoulder isometrics and isometric scapular activation,  neck MT as needed    PT Home Exercise Plan Access Code: [email or web address]    Consulted and Agree with Plan of Care Patient             Patient will benefit from skilled therapeutic intervention in order to improve the following deficits and impairments:  Pain, Increased fascial restricitons, Increased muscle spasms, Decreased scar mobility, Impaired UE functional use, Decreased range of motion, Decreased strength, Impaired flexibility, Decreased skin integrity  Visit Diagnosis: Malignant neoplasm of head and neck (HCC)  Neck stiffness  Shoulder weakness     Problem List Patient Active Problem List   Diagnosis Date Noted   Chronic limitation of movement of neck 10/26/2021   Anxiety, generalized 06/18/2020   Preventive measure 06/13/2020   Liver lesion 10/05/2019   Type 2 diabetes mellitus ([place]) 04/06/2019   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, unsp ([place]) 03/02/2019   Vitamin B12 deficiency 03/02/2019   Pancytopenia, acquired ([place]) 11/11/2018   Malignant cachexia ([place]) 11/04/2018   Nausea and vomiting 10/14/2018   Goals of care, counseling/discussion 09/21/2018   Neck mass 09/12/2018   Cancer associated pain 09/08/2018   Financial difficulties 11/26/2017   Medically noncompliant 04/13/2017   Port catheter in place 04/01/2016   Epigastric pain 02/10/2016   Gastritis without bleeding 01/22/2016   Arrhythmia 12/25/2015   Infection of eyelash follicle of left eye 66/44/0347   Poor dentition 10/16/2015   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   Other constipation 05/16/2015   Neck pain on right side 01/28/2015   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

## 2021-11-04 NOTE — Patient Instructions (Signed)
Access Code: [email or web address] URL: [email or web address]: 01/31/2023Prepared by: Marcene Brawn TevisExercises  Seated Upright Posture Correction - 1 x daily - 7 x weekly - 1 sets - 10 reps - 20-30 seconds hold  Seated Cervical Retraction - 3 x daily - 7 x weekly - 1 sets - 10 reps - no hold  Seated Scapular Retraction - 3 x daily - 7 x weekly - 1 sets - 10 reps - 5 second hold  Supine Cervical Rotation AROM on Pillow - 1 x daily - 7 x weekly - 1 sets - 5 reps - 3 second hold

## 2021-11-06 ENCOUNTER — Encounter: Payer: Self-pay | Admitting: Rehabilitation

## 2021-11-06 ENCOUNTER — Ambulatory Visit: Payer: Medicaid Other | Attending: Hematology and Oncology | Admitting: Rehabilitation

## 2021-11-06 ENCOUNTER — Other Ambulatory Visit: Payer: Self-pay

## 2021-11-06 DIAGNOSIS — C119 Malignant neoplasm of nasopharynx, unspecified: Secondary | ICD-10-CM | POA: Diagnosis present

## 2021-11-06 DIAGNOSIS — M436 Torticollis: Secondary | ICD-10-CM

## 2021-11-06 DIAGNOSIS — C760 Malignant neoplasm of head, face and neck: Secondary | ICD-10-CM

## 2021-11-06 DIAGNOSIS — R29898 Other symptoms and signs involving the musculoskeletal system: Secondary | ICD-10-CM | POA: Diagnosis present

## 2021-11-06 DIAGNOSIS — Z483 Aftercare following surgery for neoplasm: Secondary | ICD-10-CM | POA: Insufficient documentation

## 2021-11-06 DIAGNOSIS — M25611 Stiffness of right shoulder, not elsewhere classified: Secondary | ICD-10-CM | POA: Diagnosis present

## 2021-11-06 DIAGNOSIS — M542 Cervicalgia: Secondary | ICD-10-CM | POA: Insufficient documentation

## 2021-11-06 NOTE — Therapy (Signed)
[facility] @ [place], [place], [address] Phone: [phone]   Fax:  [phone]  Physical Therapy Treatment  Patient Details  Name: Christian Simmons MRN: [id number] Date of Birth: Nov 09, 1985 Referring Provider (PT): Dr. 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, unsp ([place]) 03/02/2019   Fatigue 06/01/2014   Hypothyroidism    Infection of eyelash follicle of left eye 5/85/2778   Metastasis to lung ([place]) 06/01/2014   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   Subjective Assessment - 11/06/21 0804     Subjective I am okay    Pertinent History Nasopharyngeal carcinoma diagnosed in 2008.  Chemotherapy completed.  Recurrence in 2010 lymph node with more chemotherapy. Removal of Rt neck lymph node showing NED.  Recurrence 2013 with Lt upper lobectomy and then Rt lung lesion found 2014 with more  chemotherapy (all previous treatment in [place]) .  Palliative radiation to lungs 2015. Palliative chemotherapy 2016-2019. (In [place])  2019: local recurrence Rt SCM and lymph nodes. Continuation of chemotherapy. Keytruda every 6 weeks.    Currently in Pain? Yes    Pain Score 6     Pain Location Neck    Pain Orientation Right    Pain Descriptors / Indicators Discomfort    Pain Type Chronic pain    Pain Onset More than a month ago    Pain Frequency Constant                               [facility] Adult PT Treatment/Exercise - 11/06/21 0001       Posture/Postural Control   Posture Comments review of seated posture with pt able to move into correct posture with min vcs today.      Exercises   Exercises Other Exercises    Other Exercises  supine on table: bil extension presses into table 5" x 10 with added Rt shoulder AP pressure for stabilization, chin tucks 3" x 10 with tcs for pressure amount, cervical rotation with Rt shoulder OP x 5 each, sidelying ER AROM with cueing for target and scapular assist x 10, isometric activation in  supine Rt shoulder ER/IR alternating with manual resistance 3" x 5 x 2, flexion, and extension into the table with elbow bent.  Gave isometrics for HEP addition.      Manual Therapy   Manual Therapy Taping;Soft tissue mobilization;Scapular mobilization    Manual therapy comments applied tape patch to check skin.  Planning on doing taping for anterior shoulder positioning if no irritation    Soft tissue mobilization in sidelying post scapular isometrics into Rt SCM and UT avoiding port line and port    Scapular Mobilization all directions in sidelying and then resisted retraction and depression into PTs hand with good activation                          PT Long Term Goals - 11/04/21 0912       PT LONG TERM GOAL #1   Title Pt will demonstrate appropriate sitting posture without need for vcs/tcs    Time 6    Period Weeks     Status New      PT LONG TERM GOAL #2   Title Pt will improve Rt shoulder abduction to at least 90deg to improve mobility    Baseline 60    Time 6    Period Weeks    Status New      PT LONG TERM GOAL #3   Title Pt will be ind with final HEP for continued strengthening of the shoulder and postural muscles    Time 6    Period Weeks    Status New                   Plan - 11/06/21 1324     Clinical Impression Statement First day of scapular and postural strengthening today.  More signs of difficulty post Rt shoulder dislocation that pt had after neck dissection and that was reset in [place].  Discussed how he could have ortho/sports med do imaging if he wanted more information on the shoulder status as he reports having no follow up after dislocation.  Also discussed how strengthening would most likely be the MD advice before anything else.  Pt had good scapular activation compared to most shoulder problems just due to neck dissection,  More weakness in shoulder ER, abduction noted.  Also discussed braces and taping and performed a tape trial.    PT Frequency 2x / week    PT Duration 6 weeks    PT Treatment/Interventions Therapeutic exercise;ADLs/Self Care Home Management;Scar mobilization;Passive range of motion;Dry needling;Patient/family education;Electrical Stimulation;Moist Heat;Manual techniques;Therapeutic activities;Taping    PT Next Visit Plan postural education focus to start, chin tucks, meeks/etc.,  start Rt shoulder isometrics and isometric scapular activation, neck MT as needed    Consulted and Agree with Plan of Care Patient             Patient will benefit from skilled therapeutic intervention in order to improve the following deficits and impairments:     Visit Diagnosis: Malignant neoplasm of head and neck (HCC)  Neck stiffness  Shoulder weakness     Problem List Patient Active Problem List   Diagnosis Date Noted   Chronic limitation of movement of  neck 10/26/2021   Anxiety, generalized 06/18/2020   Preventive measure 06/13/2020   Liver lesion 10/05/2019   Type 2 diabetes mellitus ([place]) 04/06/2019   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, unsp ([place]) 03/02/2019   Vitamin B12 deficiency 03/02/2019   Pancytopenia, acquired ([place])  11/11/2018   Malignant cachexia ([place]) 11/04/2018   Nausea and vomiting 10/14/2018   Goals of care, counseling/discussion 09/21/2018   Neck mass 09/12/2018   Cancer associated pain 09/08/2018   Financial difficulties 11/26/2017   Medically noncompliant 04/13/2017   Port catheter in place 04/01/2016   Epigastric pain 02/10/2016   Gastritis without bleeding 01/22/2016   Arrhythmia 12/25/2015   Infection of eyelash follicle of left eye 76/18/4859   Poor dentition 10/16/2015   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   Other constipation 05/16/2015   Neck pain on right side 01/28/2015   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

## 2021-11-06 NOTE — Patient Instructions (Signed)
°  URL: [email or web address]: 02/02/2023Prepared by: Marcene Brawn TevisExercises  Isometric Shoulder Flexion at Wall - 2 x daily - 7 x weekly - 1 sets - 5 reps - 5 second hold  Isometric Shoulder Extension at Wall - 2 x daily - 7 x weekly - 1 sets - 5 reps - 5 second hold  Standing Isometric Shoulder Internal Rotation with Towel Roll at Doorway - 2 x daily - 7 x weekly - 1 sets - 5 reps - 5 second hold  Standing Isometric Shoulder External Rotation with Doorway and Towel Roll - 2 x daily - 7 x weekly - 1 sets - 5 reps - 5 second hold

## 2021-11-11 ENCOUNTER — Ambulatory Visit: Payer: Medicaid Other | Admitting: Rehabilitative and Restorative Service Providers"

## 2021-11-13 ENCOUNTER — Ambulatory Visit: Payer: Medicaid Other

## 2021-11-13 ENCOUNTER — Other Ambulatory Visit: Payer: Self-pay

## 2021-11-13 DIAGNOSIS — R29898 Other symptoms and signs involving the musculoskeletal system: Secondary | ICD-10-CM

## 2021-11-13 DIAGNOSIS — M436 Torticollis: Secondary | ICD-10-CM

## 2021-11-13 DIAGNOSIS — C760 Malignant neoplasm of head, face and neck: Secondary | ICD-10-CM | POA: Diagnosis not present

## 2021-11-13 NOTE — Therapy (Signed)
[facility] @ [facility] [place], [place], [address] Phone: [phone]   Fax:  [phone]  Physical Therapy Treatment  Patient Details  Name: Christian Simmons MRN: [id number] Date of Birth: 06/29/1986 Referring Provider (PT): Dr. 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, unsp ([place]) 03/02/2019   Fatigue 06/01/2014   Hypothyroidism    Infection of eyelash follicle of left eye 01/16/2439   Metastasis to lung ([place]) 06/01/2014   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   Subjective Assessment - 11/13/21 0810     Subjective My Rt shoulder has been more sore since starting the exercises, but I can tell my motion has improved. And my neck hurts when I do the exercises but I think the motions are better there too.    Pertinent History Nasopharyngeal carcinoma diagnosed in 2008.  Chemotherapy  completed.  Recurrence in 2010 lymph node with more chemotherapy. Removal of Rt neck lymph node showing NED.  Recurrence 2013 with Lt upper lobectomy and then Rt lung lesion found 2014 with more chemotherapy (all previous treatment in [place]) .  Palliative radiation to lungs 2015. Palliative chemotherapy 2016-2019. (In [place])  2019: local recurrence Rt SCM and lymph nodes. Continuation of chemotherapy. Keytruda every 6 weeks.    Patient Stated Goals improve shoulder and neck movement    Currently in Pain? No/denies   just mod discomfort that in neck                              [facility] Adult PT Treatment/Exercise - 11/13/21 0001       Self-Care   Posture Spent time during session today answering pts questions about his posture and how this can affect his Rt shoulder A/ROM which also is directly related to his UE weakness. Then explaining how the exercises we were doing were working engage correct msucles without compensation, ehich he struggled with when doing scapular retraction. 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 Pt was able to verbalize better understanding of all by end of session.      Exercises   Other Exercises  Meeks Decompression Exs, pt reports feeling a very mild Rt scapular engagement with scap retract compared to Lt having a strong contraction. He struggled with compensating with T-spine with scapular retraction so had him practice this in sitting then again laying down and he was then able to better perform reporting better scapular engagment after coaching. Also he notes very mild to no contraction with Rt cervical retraction, and notices Rt LE has a weaker contraction in general compared to Lt when doing LE Meeks exs      Neck Exercises: Supine   Neck Retraction 10 reps;5 secs     Shoulder ABduction Both;10 reps   with cervical retraction   Shoulder Abduction Limitations Pt reports this is challenging      Manual Therapy   Manual therapy comments Placed 2 strips of tape, one placed at anterior shoulder and draped over upper traps on a stretch towards medial scapula, then at anterior shoulder and horizontally around to lateral scapula both done with scapular retraction    Soft tissue mobilization In Supine with biotone to bil SCM, especially at Rt but gently as pt with hypersensitivity at belly of muscle, this improved                          PT Long Term Goals - 11/04/21 0912       PT LONG TERM GOAL #1   Title Pt will demonstrate appropriate sitting posture without need for vcs/tcs    Time 6    Period Weeks    Status New      PT LONG TERM GOAL #2   Title Pt will improve Rt shoulder abduction to at least 90deg to improve mobility    Baseline 60    Time 6    Period Weeks    Status New      PT LONG TERM GOAL #3   Title Pt will be ind with final HEP for continued strengthening of the shoulder and postural muscles    Time 6    Period Weeks    Status New                   Plan - 11/13/21 1017     Clinical Impression Statement Continued with cervical isometric strengthening and added Meeks Decompression. Pt is decidely weaker at Rt side, more so at UE but he reports also noticing at LE as well with less muscular contraction noted when doing the LE portion of the Meeks exs. He was challenged by all exercises today and had a visible muscle quiver at LT [facility] with holding cervical retraction. Continued discussion with him about considering seeing an orthopedist as he also has trouble engaging his Rt scapular muscles with retraction when in supine. Pt is going to reach out to his nurse navigator to request this as he reports that he would like to know more about his shoulder dysfunction since dislocation awhile ago. Also continued with manual  therapy focusing on STM with biotone lotion along bil SCM. Pt was very hypersensitive at belly of Rt SCM but was able to tolerate light pressure. He notes that since beginning HEP issued at previous session his Rt shoulder A/ROM does feel looser as does his cervical A/ROM. Also, trial of kinesiotape (  see flowsheet) for postural muscles working to decrease Rt rounded forward shoulder.    Personal Factors and Comorbidities Comorbidity 3+;Time since onset of injury/illness/exacerbation;Past/Current Experience    Comorbidities radiation multiple times, multiple surgeries,    Examination-Activity Limitations Reach Overhead;Lift    Examination-Participation Restrictions Yard Work;Community Activity    Stability/Clinical Decision Making Stable/Uncomplicated    Rehab Potential Fair    PT Frequency 2x / week    PT Duration 6 weeks    PT Treatment/Interventions Therapeutic exercise;ADLs/Self Care Home Management;Scar mobilization;Passive range of motion;Dry needling;Patient/family education;Electrical Stimulation;Moist Heat;Manual techniques;Therapeutic activities;Taping    PT Next Visit Plan Did he hear from orthopedist? How was kinesiotape? postural education focus to start, chin tucks, meeks/etc.,  cont Rt shoulder isometrics and isometric scapular activation, neck MT as needed    PT Home Exercise Plan Access Code: [email or web address]    Consulted and Agree with Plan of Care Patient             Patient will benefit from skilled therapeutic intervention in order to improve the following deficits and impairments:  Pain, Increased fascial restricitons, Increased muscle spasms, Decreased scar mobility, Impaired UE functional use, Decreased range of motion, Decreased strength, Impaired flexibility, Decreased skin integrity  Visit Diagnosis: Malignant neoplasm of head and neck (HCC)  Neck stiffness  Shoulder weakness     Problem List Patient Active Problem List   Diagnosis Date Noted   Chronic limitation  of movement of neck 10/26/2021   Anxiety, generalized 06/18/2020   Preventive measure 06/13/2020   Liver lesion 10/05/2019   Type 2 diabetes mellitus ([place]) 04/06/2019   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, unsp ([place]) 03/02/2019   Vitamin B12 deficiency 03/02/2019   Pancytopenia, acquired ([place]) 11/11/2018   Malignant cachexia ([place]) 11/04/2018   Nausea and vomiting 10/14/2018   Goals of care, counseling/discussion 09/21/2018   Neck mass 09/12/2018   Cancer associated pain 09/08/2018   Financial difficulties 11/26/2017   Medically noncompliant 04/13/2017   Port catheter in place 04/01/2016   Epigastric pain 02/10/2016   Gastritis without bleeding 01/22/2016   Arrhythmia 12/25/2015   Infection of eyelash follicle of left eye 34/35/6861   Poor dentition 10/16/2015   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   Other constipation 05/16/2015   Neck pain on right side 01/28/2015   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

## 2021-11-14 NOTE — Progress Notes (Signed)
Subjective:    CC: R shoulder pain  I, Molly Weber, LAT, ATC, am serving as scribe for Dr. Lynne Leader.  HPI: Pt is a 36 y/o male presenting w/ c/o R shoulder pain x 8 years that began after dislocating his shoulder after falling and hitting/landing on his head/shoulder.  He locates his pain to his R ant/sup shoulder.  He is currently being treated for nasopharyngeal cancer and has been attending PT to address neck and shoulder ROM/strength.  He has completed 3 PT sessions at [facility].  His cancer has been ongoing since 2008.  The cancer at times and spread to the right supraclavicular lymph nodes or masses been excised and treated with radiation treatment in the past.  Neck pain: yes Radiating pain: no R shoulder mechanical symptoms: yes Aggravating factors: L shoulder aBD more restricted than flexion; lifting; driving Treatments tried: PT for his neck/shoulder  Diagnostic testing: CT nec w/ contrast- 10/31/20; CT chest w/ contrast- 06/12/20  Pertinent review of Systems: No fevers or chills  Relevant historical information: Cancer as above.  History of surgery and radiation therapy to the right trapezius area.   Objective:    Vitals:   11/17/21 0931  BP: 112/80  Pulse: 79  SpO2: 97%   General: Well Developed, well nourished, and in no acute distress.   MSK:  C-spine: Scarring and atrophy of right sternocleidomastoid and trapezius musculature. Decreased cervical motion to left lateral flexion and left rotation. Upper extremity strength is intact except noted below. Distally reflexes and sensation are intact. Intact grip strength finger abduction adduction.  Right shoulder: Atrophy to the trapezius and rhomboid right side. No deltoid atrophy present. Right shoulder range of motion: Abduction 90 degrees.  Internal rotation lumbar spine external rotation full. Strength: Abduction 4/5 external rotation and internal rotation 5/5. Positive Hawkins and Neer's testing.   Negative Yergason's and speeds test. Guarding throughout exam strength testing and impingement limits accuracy.  Patient had scapular winging present right scapula with abduction strength testing.    Lab and Radiology Results  X-ray images right shoulder obtained today personally and independently interpreted Port present right chest. No severe degenerative changes.  No aggressive appearing bony lesions. No acute fractures visible. Await formal radiology review    Impression and Recommendations:    Assessment and Plan: 36 y.o. male with right shoulder and trapezius and upper back pain and dysfunction.  I believe Aviel has multifactorial pain and dysfunction.  I think it is very likely that he has an internal shoulder problem very likely adhesive capsulitis or rotator cuff or labrum problem.  Plan to evaluate this more thoroughly with an MRI arthrogram.  He has had extensive evaluation and treatment already for this and MRI arthrogram should be done at this point to fully evaluate the internal structures of the shoulder to help dictate future treatment options including physical therapy or surgery.  Additionally he has significant dysfunction of the periscapular muscles including trapezius and rhomboid which could be because of prior surgery history, radiation or even nerve injury. He should continue physical therapy for now especially focused on his shoulder periscapular musculature.  If not improved in the near future and MRI is not helpful would recommend nerve conduction study to evaluate his brachial plexus given his history of radiation therapy to this.  Recheck after MRI arthrogram. .  PDMP not reviewed this encounter. Orders Placed This Encounter  Procedures   DG Shoulder Right    Standing Status:   Future  Number of Occurrences:   1    Standing Expiration Date:   12/15/2021    Order Specific Question:   Reason for Exam (SYMPTOM  OR DIAGNOSIS REQUIRED)    Answer:    R shoulder pain    Order Specific Question:   Preferred imaging location?    Answer:   [facility]   MR Shoulder Right W Contrast    Schedule for injection 1 hour prior w/ Dr. Darene Lamer    Standing Status:   Future    Standing Expiration Date:   11/17/2022    Scheduling Instructions:     Schedule for injection 1 hour prior w/ Dr. Darene Lamer    Order Specific Question:   Reason for Exam (SYMPTOM  OR DIAGNOSIS REQUIRED)    Answer:   right shoulder pain    Order Specific Question:   If indicated for the ordered procedure, I authorize the administration of contrast media per Radiology protocol    Answer:   Yes    Order Specific Question:   What is the patient's sedation requirement?    Answer:   No Sedation    Order Specific Question:   Does the patient have a pacemaker or implanted devices?    Answer:   No    Order Specific Question:   Preferred imaging location?    Answer:   Product/process development scientist (table limit-350lbs)   No orders of the defined types were placed in this encounter.   Discussed warning signs or symptoms. Please see discharge instructions. Patient expresses understanding.   The above documentation has been reviewed and is accurate and complete Lynne Leader, M.D.

## 2021-11-17 ENCOUNTER — Encounter: Payer: Self-pay | Admitting: Family Medicine

## 2021-11-17 ENCOUNTER — Ambulatory Visit (INDEPENDENT_AMBULATORY_CARE_PROVIDER_SITE_OTHER): Payer: Medicaid Other

## 2021-11-17 ENCOUNTER — Other Ambulatory Visit: Payer: Self-pay

## 2021-11-17 ENCOUNTER — Ambulatory Visit: Payer: Self-pay

## 2021-11-17 ENCOUNTER — Ambulatory Visit (INDEPENDENT_AMBULATORY_CARE_PROVIDER_SITE_OTHER): Payer: Medicaid Other | Admitting: Family Medicine

## 2021-11-17 VITALS — BP 112/80 | HR 79 | Ht 69.0 in | Wt 183.4 lb

## 2021-11-17 DIAGNOSIS — G8929 Other chronic pain: Secondary | ICD-10-CM | POA: Diagnosis not present

## 2021-11-17 DIAGNOSIS — M25511 Pain in right shoulder: Secondary | ICD-10-CM

## 2021-11-17 NOTE — Patient Instructions (Addendum)
Thank you for coming in today.   I've ordered a MRI arthrogram to [facility]. You should hear about scheduling within 1 week.  OK to continue physical therapy  Recheck back after MRI arthrogram

## 2021-11-18 ENCOUNTER — Ambulatory Visit: Payer: Medicaid Other

## 2021-11-18 DIAGNOSIS — R29898 Other symptoms and signs involving the musculoskeletal system: Secondary | ICD-10-CM

## 2021-11-18 DIAGNOSIS — C760 Malignant neoplasm of head, face and neck: Secondary | ICD-10-CM

## 2021-11-18 DIAGNOSIS — M436 Torticollis: Secondary | ICD-10-CM

## 2021-11-18 NOTE — Therapy (Signed)
[facility] @ [facility] [address] [facility] [place], [place], [address] Phone: [phone]   Fax:  [phone]  Physical Therapy Treatment  Patient Details  Name: Christian Simmons MRN: [id number] Date of Birth: [birth year] Referring Provider (PT): Dr. Alvy Bimler   Encounter Date: 11/18/2021   PT End of Session - 11/18/21 1200     Visit Number 4    Number of Visits 13    Date for PT Re-Evaluation 12/16/21    Authorization Type medicaid healthy blue - update visit limited after approval    PT Start Time 0807   pt arrived late   PT Stop Time 0902    PT Time Calculation (min) 55 min    Activity Tolerance Patient tolerated treatment well    Behavior During Therapy [facility] for tasks assessed/performed             Past Medical History:  Diagnosis Date   Arrhythmia 12/25/2015   Carcinoma ([place])    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, unsp ([place]) 03/02/2019   Fatigue 06/01/2014   Hypothyroidism    Infection of eyelash follicle of left eye 01/26/5360   Metastasis to lung ([place]) 06/01/2014   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   Subjective Assessment - 11/18/21 0810     Subjective I saw Dr. Amalia Hailey yesterday about my Rt shoulder. He is going to have me get an MRI and in the mean time wants me to cont physical therapy. (MD note says to focus on Rt periscapular musculature). The tape seemed to really help me be more aware of my Rt shoulder being  rounded forward and I feel like that is less now.    Pertinent History Nasopharyngeal carcinoma diagnosed in 2008.  Chemotherapy completed.  Recurrence in 2010 lymph node with more chemotherapy. Removal of Rt neck lymph node showing NED.  Recurrence 2013 with Lt upper lobectomy and then Rt lung lesion found 2014 with more chemotherapy (all previous treatment in [place]) .  Palliative radiation to lungs 2015. Palliative chemotherapy 2016-2019. (In [place])  2019: local recurrence Rt SCM and lymph nodes. Continuation of chemotherapy. Keytruda every 6 weeks.    Patient Stated Goals improve shoulder and neck movement    Currently in Pain? No/denies                [facility] PT Assessment - 11/18/21 0001       AROM   Right Shoulder Flexion 112 Degrees    Right Shoulder ABduction 76 Degrees                           [facility] Adult PT Treatment/Exercise - 11/18/21 0001       Exercises   Other Exercises  Reviewed Meeks Decompression Exs as pt reports he lost handout and did not get to practice these since last session: 10 reps  each with 5 sec holds with pt returning therapist demo; pt reports increase difficulty with Rt scapular engagement and struggles with compensating with lumbar ext during this ex so VCs to instruct to decr this; in general pt reports Rt sided weakness noted throughout      Manual Therapy   Manual therapy comments Placed 2 strips of tape, one placed at anterior shoulder and draped over upper traps on a stretch towards medial scapula, then at anterior shoulder and horizontally around to lateral scapula both done with scapular retraction    Soft tissue mobilization In Supine with cocoa butter to LT>Rt SCM, pts tenderness was much improved today at Rt SCM muscle belly, then into Lt S/L for STM to periscapular area, more so at medial scapular where trigger points palpable                     PT Education - 11/18/21 2542     Education Details Reprinted  initial HEP as pt repotrs he misplaced this, also issued Meeks Decompression exercises today as well.    Person(s) Educated Patient    Methods Explanation;Demonstration;Handout    Comprehension Verbalized understanding;Returned demonstration                 PT Long Term Goals - 11/04/21 0912       PT LONG TERM GOAL #1   Title Pt will demonstrate appropriate sitting posture without need for vcs/tcs    Time 6    Period Weeks    Status New      PT LONG TERM GOAL #2   Title Pt will improve Rt shoulder abduction to at least 90deg to improve mobility    Baseline 60    Time 6    Period Weeks    Status New      PT LONG TERM GOAL #3   Title Pt will be ind with final HEP for continued strengthening of the shoulder and postural muscles    Time 6    Period Weeks    Status New                   Plan - 11/18/21 1203     Clinical Impression Statement Reviewed Meeks Decompression Exs with pt. He reports still feeling increased weakness at Rt side of body compared to Lt, especially at medial scapular border. Continued with manual therapy to Rt>Lt SCM and pt with much less tenderness here today. Also STM to Rt medial scapular border where trigger points palpable. Pt reports feeling muchh improvement with less general tightness in Rt upper quadrant after session. Also continued with kinesiotape as done before as he reported feeling great benefit from that to help improve his posture/decrease forward rounded Rt shoulder. Pt saw orthopedist Dr. Amalia Hailey yesterday and pt is awaiting a call to schedule an MRI for his Rt shoulder. Per MD note cont PT in mean time focusing on periscapular musculature.    Personal Factors and Comorbidities Comorbidity 3+;Time since onset of injury/illness/exacerbation;Past/Current Experience    Comorbidities radiation multiple times, multiple surgeries,    Examination-Activity Limitations Reach Overhead;Lift    Examination-Participation Restrictions Yard  Work;Community Activity    Stability/Clinical Decision Making Stable/Uncomplicated    Rehab Potential Fair    PT Frequency 2x / week    PT Duration 6 weeks    PT Treatment/Interventions Therapeutic exercise;ADLs/Self Care Home Management;Scar mobilization;Passive range of motion;Dry needling;Patient/family education;Electrical Stimulation;Moist Heat;Manual techniques;Therapeutic activities;Taping    PT Next Visit Plan Cont  kinesiotape/ was he able to purchase some? Cont postural education with chin tucks, meeks/etc., cont Rt shoulder isometrics and isometric scapular activation, neck MT as needed    PT Home Exercise Plan Access Code: [email or web address]; Meeks Decompression Exs    Consulted and Agree with Plan of Care Patient             Patient will benefit from skilled therapeutic intervention in order to improve the following deficits and impairments:  Pain, Increased fascial restricitons, Increased muscle spasms, Decreased scar mobility, Impaired UE functional use, Decreased range of motion, Decreased strength, Impaired flexibility, Decreased skin integrity  Visit Diagnosis: Malignant neoplasm of head and neck (HCC)  Neck stiffness  Shoulder weakness     Problem List Patient Active Problem List   Diagnosis Date Noted   Chronic limitation of movement of neck 10/26/2021   Anxiety, generalized 06/18/2020   Preventive measure 06/13/2020   Liver lesion 10/05/2019   Type 2 diabetes mellitus ([place]) 04/06/2019   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, unsp ([place]) 03/02/2019   Vitamin B12 deficiency 03/02/2019   Pancytopenia, acquired ([place]) 11/11/2018   Malignant cachexia ([place]) 11/04/2018   Nausea and vomiting 10/14/2018   Goals of care, counseling/discussion 09/21/2018   Neck mass 09/12/2018   Cancer associated pain 09/08/2018   Financial difficulties 11/26/2017   Medically noncompliant 04/13/2017   Port catheter in place 04/01/2016   Epigastric pain 02/10/2016    Gastritis without bleeding 01/22/2016   Arrhythmia 12/25/2015   Infection of eyelash follicle of left eye 46/80/3212   Poor dentition 10/16/2015   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   Other constipation 05/16/2015   Neck pain on right side 01/28/2015   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

## 2021-11-18 NOTE — Progress Notes (Signed)
The right shoulder x-ray looks okay.  No evidence of tumor involvement in the bone is present.  No arthritis is visible on the bone no evidence of radiation injury to the bones in the shoulder are present. An MRI would be helpful here to further evaluate this.

## 2021-11-18 NOTE — Patient Instructions (Signed)
1. Decompression Exercise     Cancer Rehab [phone]    Lie on back on firm surface, knees bent, feet flat, arms turned up, out to sides, backs of hands down. Time _5-15__ minutes. Surface: floor   2. Shoulder Press    Start in Decompression Exercise position. Press shoulders downward towards supporting surface. Hold __2-3__ seconds while counting out loud. Repeat _3-5___ times. Do _1-2___ times per day.   3. Head Press    Bring cervical spine (neck) into neutral position (by either tucking the chin towards the chest or tilting the chin upward). Feel weight on back of head. Press head downward into supporting surface.    Hold _2-3__ seconds. Repeat _3-5__ times. Do _1-2__ times per day.   4. Leg Lengthener    Straighten one leg. Pull toes AND forefoot toward knee, extend heel. Lengthen leg by pulling pelvis away from ribs. Hold _2-3__ seconds. Relax. Repeat __4-6__ times. Do other leg.  Surface: floor   5. Leg Press    Straighten one leg down to floor keeping leg aligned with hip. Pull toes AND forefoot toward knee; extend heel.  Press entire leg downward (as if pressing leg into sandy beach). DO NOT BEND KNEE. Hold _2-3__ seconds. Do __4-6__ times. Repeat with other leg.

## 2021-11-25 ENCOUNTER — Other Ambulatory Visit: Payer: Self-pay

## 2021-11-25 ENCOUNTER — Encounter: Payer: Self-pay | Admitting: Rehabilitation

## 2021-11-25 ENCOUNTER — Ambulatory Visit: Payer: Medicaid Other | Admitting: Rehabilitation

## 2021-11-25 DIAGNOSIS — C119 Malignant neoplasm of nasopharynx, unspecified: Secondary | ICD-10-CM

## 2021-11-25 DIAGNOSIS — M542 Cervicalgia: Secondary | ICD-10-CM

## 2021-11-25 DIAGNOSIS — Z483 Aftercare following surgery for neoplasm: Secondary | ICD-10-CM

## 2021-11-25 DIAGNOSIS — C760 Malignant neoplasm of head, face and neck: Secondary | ICD-10-CM | POA: Diagnosis not present

## 2021-11-25 DIAGNOSIS — M25611 Stiffness of right shoulder, not elsewhere classified: Secondary | ICD-10-CM

## 2021-11-25 NOTE — Therapy (Signed)
OUTPATIENT PHYSICAL THERAPY TREATMENT NOTE   Patient Name: Christian Simmons MRN: [id number] DOB:1986-08-12, 36 y.o., male Today's Date: 11/25/2021  PCP: Patient, No Pcp Per (Inactive) REFERRING PROVIDER: [name], MD   PT End of Session - 11/25/21 0823     Visit Number 5    Number of Visits 13    Date for PT Re-Evaluation 12/16/21    Authorization Type okay 6 visits 11/11/21=12/02/21    Authorization - Visit Number 4    Authorization - Number of Visits 6    PT Start Time 0825   late   PT Stop Time 0900    PT Time Calculation (min) 35 min    Activity Tolerance Patient tolerated treatment well    Behavior During Therapy [facility] for tasks assessed/performed             Past Medical History:  Diagnosis Date   Arrhythmia 12/25/2015   Carcinoma ([place])    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, unsp ([place]) 03/02/2019   Fatigue 06/01/2014   Hypothyroidism    Infection of eyelash follicle of left eye 3/47/4259   Metastasis to lung ([place]) 06/01/2014   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   Neuropathy    Radiation 07/18/14-07/31/14   Left upper lobe  40 gy in 10 fractions   Seizures ([place]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   Past Surgical History:  Procedure Laterality Date   LUNG REMOVAL, PARTIAL  05/03/2012   left upper lobectomy   nasal biopsy     RADICAL NECK DISSECTION     VIDEO BRONCHOSCOPY N/A 06/29/2014   Procedure: VIDEO BRONCHOSCOPY ;  Surgeon: Melrose Nakayama, MD;  Location: [facility];  Service: Thoracic;  Laterality: N/A;   Patient Active Problem List   Diagnosis Date Noted   Chronic limitation of movement of neck 10/26/2021   Anxiety, generalized 06/18/2020   Preventive measure 06/13/2020   Liver lesion 10/05/2019   Type 2 diabetes mellitus ([place]) 04/06/2019   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, unsp ([place]) 03/02/2019   Vitamin B12 deficiency 03/02/2019   Pancytopenia, acquired ([place]) 11/11/2018   Malignant cachexia ([place]) 11/04/2018    Nausea and vomiting 10/14/2018   Goals of care, counseling/discussion 09/21/2018   Neck mass 09/12/2018   Cancer associated pain 09/08/2018   Financial difficulties 11/26/2017   Medically noncompliant 04/13/2017   Port catheter in place 04/01/2016   Epigastric pain 02/10/2016   Gastritis without bleeding 01/22/2016   Arrhythmia 12/25/2015   Infection of eyelash follicle of left eye 56/38/7564   Poor dentition 10/16/2015   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   Other constipation 05/16/2015   Neck pain on right side 01/28/2015   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 Chemotherapy completed. Recurrence in 2010 lymph node with more chemotherapy. Removal of Rt neck lymph node showing NED. Recurrence 2013 with Lt upper lobectomy and then Rt lung lesion found 2014 with more chemotherapy (all previous treatment in [place]) .  Palliative radiation to lungs 2015. Palliative chemotherapy 2016-2019. (In [place]) 2019: local recurrence Rt SCM and lymph nodes. Continuation of chemotherapy. Keytruda every 6 weeks.  PRECAUTIONS: port in place  SUBJECTIVE: The chin tucks hurt but I think my shoulder is better  PAIN:  Are you having pain? No  TODAY'S TREATMENT:  11/25/21 Manual Therapy: STM: Supine STM bil UT, scalenes, SCM, suboccipitals Rt>Lt avoiding port in neutral and rotation and lateral flexion  positions Reviewed chin tuck with tcs and assistance from PT - vcs to decrease force of pressure during tuck as pt is activating all anterior    neck muscles forcefully.  Better with vcs. Isometrics: ER/IR Rt shoulder 6" x 5 each Taping: Kinesiotape Rt shoulder 1 I strip from anterior shoulder to mid scapula and 1 Y strip with proximal deltoid anchor and one strip along supraspinatus belly and one strip along infraspinatus.  All applied in retraction with 50% pull.  Reminder on removal.    HOME EXERCISE PROGRAM: Access Code: [email or web address] M[place]ks decompresison     PT Long Term Goals - 11/04/21 0912       PT LONG TERM GOAL #1   Title Pt will demonstrate appropriate sitting posture without need for vcs/tcs    Time 6    Period Weeks    Status New      PT LONG TERM GOAL #2   Title Pt will improve Rt shoulder abduction to at least 90deg to improve mobility    Baseline 60    Time 6    Period Weeks    Status New      PT LONG TERM GOAL #3   Title Pt will be ind with final HEP for continued strengthening of the shoulder and postural muscles    Time 6    Period Weeks    Status New              Plan - 11/13/21 1017       Clinical Impression Statement Pt arrived late today so focused on MT.  Continued with cervical STM to improve posture and ROM as well as shoulder isometrics and taping to improve Rt shoulder stability.      Personal Factors and Comorbidities Comorbidity 3+;Time since onset of injury/illness/exacerbation;Past/Current Experience     Comorbidities radiation multiple times, multiple surgeries,     Examination-Activity Limitations Reach Overhead;Lift     Examination-Participation Restrictions Yard Work;Community Activity     Stability/Clinical Decision Making Stable/Uncomplicated     Rehab Potential Fair     PT Frequency 2x / week     PT Duration 6 weeks     PT Treatment/Interventions Therapeutic exercise;ADLs/Self Care Home Management;Scar mobilization;Passive range of  motion;Dry needling;Patient/family education;Electrical Stimulation;Moist Heat;Manual techniques;Therapeutic activities;Taping     PT Next Visit Plan MRI results?   Cont postural education focus to start, chin tucks, meeks/etc.,  cont Rt shoulder isometrics and isometric scapular activation, neck MT as needed       Stark Bray, PT 11/25/2021, 9:12 AM

## 2021-11-27 ENCOUNTER — Ambulatory Visit: Payer: Medicaid Other | Admitting: Rehabilitation

## 2021-11-27 ENCOUNTER — Other Ambulatory Visit: Payer: Self-pay

## 2021-11-27 ENCOUNTER — Encounter: Payer: Self-pay | Admitting: Rehabilitation

## 2021-11-27 DIAGNOSIS — C119 Malignant neoplasm of nasopharynx, unspecified: Secondary | ICD-10-CM

## 2021-11-27 DIAGNOSIS — M25611 Stiffness of right shoulder, not elsewhere classified: Secondary | ICD-10-CM

## 2021-11-27 DIAGNOSIS — Z483 Aftercare following surgery for neoplasm: Secondary | ICD-10-CM

## 2021-11-27 DIAGNOSIS — R29898 Other symptoms and signs involving the musculoskeletal system: Secondary | ICD-10-CM

## 2021-11-27 DIAGNOSIS — C760 Malignant neoplasm of head, face and neck: Secondary | ICD-10-CM

## 2021-11-27 DIAGNOSIS — M436 Torticollis: Secondary | ICD-10-CM

## 2021-11-27 DIAGNOSIS — M542 Cervicalgia: Secondary | ICD-10-CM

## 2021-11-27 NOTE — Patient Instructions (Signed)
Access Code: [email or web address]: [email or web address]: 02/23/2023Prepared by: Marcene Brawn TevisExercises  Seated Upright Posture Correction - 1 x daily - 7 x weekly - 1 sets - 10 reps - 20-30 seconds hold  Supine Cervical Rotation AROM on Pillow - 1 x daily - 7 x weekly - 1 sets - 5 reps - 3 second hold  Standing Shoulder Row with Anchored Resistance - 1 x daily - 3 x weekly - 1-3 sets - 10 reps - no hold  Shoulder extension with resistance - Neutral - 1 x daily - 3 x weekly - 1-3 sets - 10 reps - no hold  Standing Wall [employer] with Mini Swiss Ball - 1 x daily - 3 x weekly - 1-3 sets - 10 reps - no hold  Standing Wall Ball Circles in [place] with Mini Swiss Ball - 1 x daily - 3 x weekly - 1-3 sets - 10 reps - no hold

## 2021-11-27 NOTE — Therapy (Signed)
[facility] @ [address] [place], [place], [address] Phone: [phone]   Fax:  [phone]  Physical Therapy Treatment  Patient Details  Name: Christian Simmons MRN: [id number] Date of Birth: [birth year] Referring Provider (PT): Dr. 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, unsp ([place]) 03/02/2019   Fatigue 06/01/2014   Hypothyroidism    Infection of eyelash follicle of left eye 0/27/2536   Metastasis to lung ([place]) 06/01/2014   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   Subjective Assessment - 11/27/21 0807     Subjective It is good/bad    Pertinent History Nasopharyngeal carcinoma diagnosed in 2008.  Chemotherapy completed.  Recurrence in 2010 lymph node with more chemotherapy. Removal of Rt neck lymph node showing NED.  Recurrence 2013 with Lt upper  lobectomy and then Rt lung lesion found 2014 with more chemotherapy (all previous treatment in [place]) .  Palliative radiation to lungs 2015. Palliative chemotherapy 2016-2019. (In [place])  2019: local recurrence Rt SCM and lymph nodes. Continuation of chemotherapy. Keytruda every 6 weeks.    Currently in Pain? No/denies                               [facility] Adult PT Treatment/Exercise - 11/27/21 0001       Exercises   Exercises Shoulder      Shoulder Exercises: Supine   Protraction Right;10 reps    Protraction Limitations pro/retraction with tcs and vcs    Flexion AAROM;Both;5 reps    Flexion Limitations easy    Other Supine Exercises 90deg of flexion c/cc circles      Shoulder Exercises: Sidelying   External Rotation Right;10 reps    External Rotation Limitations towel roll in axilla and tcs for motion    ABduction Right;10 reps;AAROM      Shoulder Exercises: Standing   Extension Both    Theraband Level (Shoulder Extension) Level 1 (Yellow)    Extension Limitations with inital instruction and added to HEP    Row Both;10 reps    Theraband Level (  Shoulder Row) Level 1 (Yellow)    Row Limitations with intial instruction and added to HEP    Other Standing Exercises purple ball on wall circles c/cc 2x10      Manual Therapy   Manual therapy comments held on taping per pt request    Soft tissue mobilization In Supine with cocoa butter to Rt>Lt SCM, pts tenderness was much improved today at Rt SCM muscle belly    Scapular Mobilization all directions in sidelying and then resisted retraction and depression into PTs hand with good activation                          PT Long Term Goals - 11/04/21 0912       PT LONG TERM GOAL #1   Title Pt will demonstrate appropriate sitting posture without need for vcs/tcs    Time 6    Period Weeks    Status New      PT LONG TERM GOAL #2   Title Pt will improve Rt shoulder abduction to at least 90deg to improve  mobility    Baseline 60    Time 6    Period Weeks    Status New      PT LONG TERM GOAL #3   Title Pt will be ind with final HEP for continued strengthening of the shoulder and postural muscles    Time 6    Period Weeks    Status New                   Plan - 11/27/21 0177     Clinical Impression Statement Pt doing very well today.  Improved shoulder activation from last visits with the PT.  No pain and ability to perform standing scapular activation.  Fatigue and muscle burn noted by pt as well as surprise with how hard these yellow band exercises were.  Pt has shoulder MRI on Monday.    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[place])  Cervicalgia  Aftercare following surgery for neoplasm  Stiffness of right shoulder, not elsewhere classified  Malignant neoplasm of head and neck (HCC)  Neck stiffness  Shoulder weakness     Problem List Patient Active Problem List   Diagnosis Date Noted   Chronic limitation of movement of neck 10/26/2021   Anxiety, generalized 06/18/2020   Preventive measure 06/13/2020   Liver lesion 10/05/2019   Type 2 diabetes mellitus ([place]) 04/06/2019   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, unsp ([place]) 03/02/2019   Vitamin B12 deficiency 03/02/2019   Pancytopenia, acquired ([place]) 11/11/2018   Malignant cachexia ([place]) 11/04/2018   Nausea and vomiting 10/14/2018    Goals of care, counseling/discussion 09/21/2018   Neck mass 09/12/2018   Cancer associated pain 09/08/2018   Financial difficulties 11/26/2017   Medically noncompliant 04/13/2017   Port catheter in place 04/01/2016   Epigastric pain 02/10/2016   Gastritis without bleeding 01/22/2016  Arrhythmia 12/25/2015   Infection of eyelash follicle of left eye 44/97/5300   Poor dentition 10/16/2015   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   Other constipation 05/16/2015   Neck pain on right side 01/28/2015   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

## 2021-11-28 ENCOUNTER — Other Ambulatory Visit: Payer: Self-pay | Admitting: Hematology and Oncology

## 2021-11-28 ENCOUNTER — Telehealth: Payer: Self-pay

## 2021-11-28 ENCOUNTER — Other Ambulatory Visit (HOSPITAL_COMMUNITY): Payer: Self-pay

## 2021-11-28 MED ORDER — OXYCODONE HCL 5 MG PO TABS
5.0000 mg | ORAL_TABLET | Freq: Three times a day (TID) | ORAL | 0 refills | Status: DC | PRN
  Filled 2021-11-28: qty 90, 30d supply, fill #0

## 2021-11-28 NOTE — Telephone Encounter (Signed)
Called and given below message. He verbalized understanding. 

## 2021-11-28 NOTE — Telephone Encounter (Signed)
Sent to [facility] ?

## 2021-11-28 NOTE — Telephone Encounter (Signed)
He called and left a message requesting a refill of the oxycodone Rx. He said thank you and have a good weekend.

## 2021-12-01 ENCOUNTER — Other Ambulatory Visit: Payer: Self-pay

## 2021-12-01 ENCOUNTER — Ambulatory Visit (INDEPENDENT_AMBULATORY_CARE_PROVIDER_SITE_OTHER): Payer: Medicaid Other

## 2021-12-01 ENCOUNTER — Ambulatory Visit: Payer: Medicaid Other | Admitting: Sports Medicine

## 2021-12-01 ENCOUNTER — Other Ambulatory Visit (HOSPITAL_BASED_OUTPATIENT_CLINIC_OR_DEPARTMENT_OTHER): Payer: Self-pay

## 2021-12-01 ENCOUNTER — Other Ambulatory Visit (HOSPITAL_COMMUNITY): Payer: Self-pay

## 2021-12-01 DIAGNOSIS — M25511 Pain in right shoulder: Secondary | ICD-10-CM

## 2021-12-01 DIAGNOSIS — G8929 Other chronic pain: Secondary | ICD-10-CM

## 2021-12-01 DIAGNOSIS — S43014S Anterior dislocation of right humerus, sequela: Secondary | ICD-10-CM | POA: Diagnosis not present

## 2021-12-01 DIAGNOSIS — S43014A Anterior dislocation of right humerus, initial encounter: Secondary | ICD-10-CM | POA: Insufficient documentation

## 2021-12-01 NOTE — Progress Notes (Signed)
° ° °  Procedures performed today:    Procedure: Real-time Ultrasound Guided gadolinium contrast injection of right glenohumeral joint Device: Samsung HS60  Verbal informed consent obtained.  Time-out conducted.  Noted no overlying erythema, induration, or other signs of local infection.  Skin prepped in a sterile fashion.  Local anesthesia: Topical Ethyl chloride.  With sterile technique and under real time ultrasound guidance: Noted Hill-Sachs lesion, advanced a 22-gauge spinal needle into the glenohumeral joint from a posterior approach, injected 1 cc kenalog 40, 2 cc lidocaine, 2 cc bupivacaine, syringe switched and 0.1 cc gadolinium injected, syringe again switched and 10 cc sterile saline used to fully distend the joint. Joint visualized and capsule seen distending confirming intra-articular placement of contrast material and medication. Completed without difficulty  Advised to call if fevers/chills, erythema, induration, drainage, or persistent bleeding.  Images permanently stored in PACS Impression: Technically successful ultrasound guided gadolinium contrast injection for MR arthrography.  Please see separate MR arthrogram report.   Independent interpretation of notes and tests performed by another provider:   None.  Brief History, Exam, Impression, and Recommendations:    Anterior dislocation of right shoulder History of right shoulder dislocation, patient referred to me for arthrogram injection, noted Hill-Sachs lesion posterior humerus, further management per primary treating provider.    ___________________________________________ Gwen Her. Dianah Field, M.D., ABFM., CAQSM. Primary Care and [place] Instructor of [place] [employer] [facility] of [employer]

## 2021-12-01 NOTE — Assessment & Plan Note (Signed)
History of right shoulder dislocation, patient referred to me for arthrogram injection, noted Hill-Sachs lesion posterior humerus, further management per primary treating provider.

## 2021-12-02 ENCOUNTER — Ambulatory Visit: Payer: Medicaid Other | Admitting: Rehabilitation

## 2021-12-02 ENCOUNTER — Telehealth: Payer: Self-pay

## 2021-12-02 ENCOUNTER — Encounter: Payer: Self-pay | Admitting: Rehabilitation

## 2021-12-02 DIAGNOSIS — C760 Malignant neoplasm of head, face and neck: Secondary | ICD-10-CM

## 2021-12-02 DIAGNOSIS — R29898 Other symptoms and signs involving the musculoskeletal system: Secondary | ICD-10-CM

## 2021-12-02 DIAGNOSIS — Z483 Aftercare following surgery for neoplasm: Secondary | ICD-10-CM

## 2021-12-02 DIAGNOSIS — M436 Torticollis: Secondary | ICD-10-CM

## 2021-12-02 DIAGNOSIS — M542 Cervicalgia: Secondary | ICD-10-CM

## 2021-12-02 DIAGNOSIS — M25611 Stiffness of right shoulder, not elsewhere classified: Secondary | ICD-10-CM

## 2021-12-02 DIAGNOSIS — C119 Malignant neoplasm of nasopharynx, unspecified: Secondary | ICD-10-CM

## 2021-12-02 NOTE — Telephone Encounter (Signed)
Called and given below message. He verbalized understanding and will keep appt as scheduled. He called and his medicaid does not expire until 3/31. He will work on [employer] issue.

## 2021-12-02 NOTE — Telephone Encounter (Signed)
I do not know how long it would take him to get it I would defer to him Technically I cannot advise patient not to get treatment on the basis of financial situation I was also made aware they can bill retroactively for some Medicaid patients

## 2021-12-02 NOTE — Therapy (Signed)
[facility] @ [facility] [place], [place], [address] Phone: [phone]   Fax:  [phone]  Physical Therapy Treatment  Patient Details  Name: Christian Simmons MRN: [id number] Date of Birth: 07-18-1986 Referring Provider (PT): Dr. 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, unsp ([place]) 03/02/2019   Fatigue 06/01/2014   Hypothyroidism    Infection of eyelash follicle of left eye 05/29/538   Metastasis to lung ([place]) 06/01/2014   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   Subjective Assessment - 12/02/21 0813     Subjective I had arthrogram and MRI yesterday - no results yet.    Pertinent History Nasopharyngeal carcinoma diagnosed in 2008.  Chemotherapy completed.  Recurrence in 2010 lymph node with more chemotherapy. Removal of Rt neck lymph node showing NED.  Recurrence 2013 with Lt upper lobectomy and then Rt  lung lesion found 2014 with more chemotherapy (all previous treatment in [place]) .  Palliative radiation to lungs 2015. Palliative chemotherapy 2016-2019. (In [place])  2019: local recurrence Rt SCM and lymph nodes. Continuation of chemotherapy. Keytruda every 6 weeks.    Limitations Lifting    Currently in Pain? Yes   i took oxycodone this morning   Pain Score 0-No pain                               [facility] Adult PT Treatment/Exercise - 12/02/21 0001       Shoulder Exercises: Standing   External Rotation Right;12 reps    Theraband Level (Shoulder External Rotation) Level 1 (Yellow)    Internal Rotation Right;12 reps    Theraband Level (Shoulder Internal Rotation) Level 1 (Yellow)    Extension Both;15 reps    Theraband Level (Shoulder Extension) Level 2 (Red)    Row 15 reps;Both    Theraband Level (Shoulder Row) Level 2 (Red)    Other Standing Exercises purple ball on wall circles c/cc 2x10      Manual Therapy   Manual therapy comments edu on self taping until return to PT    Soft tissue mobilization In Supine with cocoa butter  to Rt>Lt SCM, pts tenderness was much improved today at Rt SCM muscle belly                          PT Long Term Goals - 11/04/21 0912       PT LONG TERM GOAL #1   Title Pt will demonstrate appropriate sitting posture without need for vcs/tcs    Time 6    Period Weeks    Status New      PT LONG TERM GOAL #2   Title Pt will improve Rt shoulder abduction to at least 90deg to improve mobility    Baseline 60    Time 6    Period Weeks    Status New      PT LONG TERM GOAL #3   Title Pt will be ind with final HEP for continued strengthening of the shoulder and postural muscles    Time 6    Period Weeks    Status New                   Plan - 12/02/21 3810     Clinical Impression Statement Pts medicaid expired today so he was given phone numbers to try and restart.  Thinking maybe his forms went to the wrong  addresss.  Cancelled Thursday but will keep the other appts in case it gets fixed by then.  Pt is doing much better overall and was able to add shoulder IR and ER and switch to the red band.  Pt is getting frustrated by all of his medical problems and should have more info on the shoulder arthrogram and MRI soon.    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[place])  Cervicalgia  Aftercare following surgery for neoplasm  Stiffness of right shoulder, not elsewhere classified  Malignant neoplasm of head and neck (HCC)  Neck stiffness  Shoulder weakness     Problem List Patient Active Problem List   Diagnosis Date Noted   Anterior dislocation of right shoulder 12/01/2021   Chronic limitation of movement of neck 10/26/2021   Anxiety, generalized 06/18/2020   Preventive measure 06/13/2020   Liver lesion 10/05/2019   Type 2 diabetes mellitus ([place]) 04/06/2019   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, unsp ([place]) 03/02/2019   Vitamin B12 deficiency 03/02/2019   Pancytopenia, acquired ([place]) 11/11/2018   Malignant cachexia ([place]) 11/04/2018   Nausea and vomiting 10/14/2018   Goals of care, counseling/discussion 09/21/2018   Neck mass 09/12/2018   Cancer associated pain 09/08/2018   Financial difficulties 11/26/2017   Medically noncompliant 04/13/2017   Port catheter in place 04/01/2016   Epigastric pain  02/10/2016   Gastritis without bleeding 01/22/2016   Arrhythmia 12/25/2015   Infection of eyelash follicle of left eye 17/51/0258   Poor dentition 10/16/2015   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   Other constipation 05/16/2015   Neck  pain on right side 01/28/2015   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

## 2021-12-02 NOTE — Telephone Encounter (Signed)
He called and left a message. He just realized today that his medicaid expires today while see PT. He is asking should he cancel 3/3 appts? While he works on it.

## 2021-12-03 NOTE — Progress Notes (Signed)
MRI arthrogram of the shoulder shows evidence of a partial shoulder dislocation with resulting labrum tear.  Recommend return to clinic to go the results in full detail.  Surgery is an option here.

## 2021-12-04 ENCOUNTER — Other Ambulatory Visit: Payer: Self-pay

## 2021-12-04 ENCOUNTER — Ambulatory Visit: Payer: Medicaid Other | Attending: Hematology and Oncology | Admitting: Rehabilitation

## 2021-12-04 ENCOUNTER — Encounter: Payer: Self-pay | Admitting: Rehabilitation

## 2021-12-04 DIAGNOSIS — Z483 Aftercare following surgery for neoplasm: Secondary | ICD-10-CM | POA: Diagnosis present

## 2021-12-04 DIAGNOSIS — C119 Malignant neoplasm of nasopharynx, unspecified: Secondary | ICD-10-CM | POA: Insufficient documentation

## 2021-12-04 DIAGNOSIS — R29898 Other symptoms and signs involving the musculoskeletal system: Secondary | ICD-10-CM | POA: Diagnosis present

## 2021-12-04 DIAGNOSIS — M542 Cervicalgia: Secondary | ICD-10-CM | POA: Insufficient documentation

## 2021-12-04 DIAGNOSIS — C760 Malignant neoplasm of head, face and neck: Secondary | ICD-10-CM | POA: Insufficient documentation

## 2021-12-04 DIAGNOSIS — M436 Torticollis: Secondary | ICD-10-CM | POA: Insufficient documentation

## 2021-12-04 DIAGNOSIS — M25611 Stiffness of right shoulder, not elsewhere classified: Secondary | ICD-10-CM | POA: Diagnosis present

## 2021-12-04 NOTE — Therapy (Signed)
[facility] ?[facility] @ [facility] ?[place], [place], [address] ?Phone: [phone]   Fax:  [phone] ? ?Physical Therapy Treatment ? ?Patient Details  ?Name: Christian Simmons ?MRN: [id number] ?Date of Birth: 1985/10/14 ?Referring Provider (PT): Dr. Alvy Bimler ? ? ?Encounter Date: 12/04/2021 ? ? PT End of Session - 12/04/21 3846   ? ? Visit Number 8   ? Number of Visits 13   ? Date for PT Re-Evaluation 12/16/21   ? Authorization Type update medicaid auth as able   ? Authorization - Visit Number 1   ? PT Start Time 0805   ? PT Stop Time 0850   ? PT Time Calculation (min) 45 min   ? Activity Tolerance Patient tolerated treatment well   ? Behavior During Therapy [facility] for tasks assessed/performed   ? ?  ?  ? ?  ? ? ?Past Medical History:  ?Diagnosis Date  ? Arrhythmia 12/25/2015  ? Carcinoma ([place])   ? 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 Fatigue 06/01/2014  ? Hypothyroidism   ? Infection of eyelash follicle of left eye 6/59/9357  ? Metastasis to lung ([place]) 06/01/2014  ? 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 Neuropathy   ? Radiation 07/18/14-07/31/14  ? Left upper lobe  40 gy in 10 fractions  ? Seizures ([place])   ? 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  ? ? ?Past Surgical History:  ?Procedure Laterality Date  ? LUNG REMOVAL, PARTIAL  05/03/2012  ? left upper lobectomy  ? nasal biopsy    ? RADICAL NECK DISSECTION    ? VIDEO BRONCHOSCOPY N/A 06/29/2014  ? Procedure: VIDEO BRONCHOSCOPY ;  Surgeon: Melrose Nakayama, MD;  Location: [facility];  Service: Thoracic;  Laterality: N/A;  ? ? ?There were no vitals filed for this visit. ? ? Subjective Assessment - 12/04/21 0840   ? ? Subjective I am doing well.  I have a better mind set and less pain.   ? Pertinent History Nasopharyngeal carcinoma diagnosed in 2008.  Chemotherapy completed.  Recurrence in 2010 lymph node with more chemotherapy. Removal of Rt neck lymph node showing NED.  Recurrence 2013 with Lt upper  lobectomy and then Rt lung lesion found 2014 with more chemotherapy (all previous treatment in [place]) .  Palliative radiation to lungs 2015. Palliative chemotherapy 2016-2019. (In [place])  2019: local recurrence Rt SCM and lymph nodes. Continuation of chemotherapy. Keytruda every 6 weeks.   ? Diagnostic tests recent MRI: anterior labral tear, hill sachs humerus fracture, intact rotator cuff   ? Currently in Pain? No/denies   ? ?  ?  ? ?  ? ? ? ? ? ? ? ? ? ? ? ? ? ? ? ? ? ? ? ? [facility] Adult PT Treatment/Exercise - 12/04/21 0001   ? ?  ? Shoulder Exercises: Supine  ? Other Supine Exercises alternating isometrics at 90deg   ?  ? Shoulder Exercises: Prone  ? Extension Right;10 reps   ? Extension Weight (lbs) 3   ? Other Prone Exercises prone row 3# x 10 Rt   ?  ? Shoulder Exercises: Sidelying  ? External Rotation Right;10 reps   ? External Rotation Weight (lbs) 3   ? External Rotation Limitations towel roll in axilla and tcs for motion   ? ABduction Right;10 reps   ? ABduction Weight (lbs) 3   ?  ? Manual Therapy  ? Soft tissue mobilization In Supine with cocoa butter  to Rt>Lt SCM, pts tenderness was much improved today at Rt SCM muscle belly   ? ?  ?  ? ?  ? ? ? ? ? ? ? ? ? ? ? ? ? ? ? PT Long Term Goals - 11/04/21 0912   ? ?  ? PT LONG TERM GOAL #1  ? Title Pt will demonstrate appropriate sitting posture without need for vcs/tcs   ? Time 6   ? Period Weeks   ? Status New   ?  ? PT LONG TERM GOAL #2  ? Title Pt will improve Rt shoulder abduction to at least 90deg to improve mobility   ? Baseline 60   ? Time 6   ? Period Weeks   ? Status New   ?  ? PT LONG TERM GOAL #3  ? Title Pt will be ind with final HEP for continued strengthening of the shoulder and postural muscles   ? Time 6   ? Period Weeks   ? Status New   ? ?  ?  ? ?  ? ? ? ? ? ? ? ? Plan - 12/04/21 0851   ? ? Clinical Impression Statement Pt has imaging summary up today but has not yet talked to the MD. Let him know that is is noted that surgery correction is an  option as noted and pt is happy to know there is something actually wrong with the shoulder and not just from the cancer surgery.  Continued shoulder strengthening and neck STM.   ? PT Frequency 2x / week   ? PT Duration 6 weeks   ? PT Treatment/Interventions Therapeutic exercise;ADLs/Self Care Home Management;Scar mobilization;Passive range of motion;Dry needling;Patient/family education;Electrical Stimulation;Moist Heat;Manual techniques;Therapeutic activities;Taping   ? Consulted and Agree with Plan of Care Patient   ? ?  ?  ? ?  ? ? ?Patient will benefit from skilled therapeutic intervention in order to improve the following deficits and impairments:    ? ?Visit Diagnosis: ?Malignant neoplasm of nasopharynx ([place]) ? ?Cervicalgia ? ?Aftercare following surgery for neoplasm ? ?Stiffness of right shoulder, not elsewhere classified ? ?Malignant neoplasm of head and neck ([place]) ? ?Neck stiffness ? ?Shoulder weakness ? ? ? ? ?Problem List ?Patient Active Problem List  ? Diagnosis Date Noted  ? Anterior dislocation of right shoulder 12/01/2021  ? Chronic limitation of movement of neck 10/26/2021  ? Anxiety, generalized 06/18/2020  ? Preventive measure 06/13/2020  ? Liver lesion 10/05/2019  ? Type 2 diabetes mellitus ([place]) 04/06/2019  ? 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 Vitamin B12 deficiency 03/02/2019  ? Pancytopenia, acquired ([place]) 11/11/2018  ? Malignant cachexia ([place]) 11/04/2018  ? Nausea and vomiting 10/14/2018  ? Goals of care, counseling/discussion 09/21/2018  ? Neck mass 09/12/2018  ? Cancer associated pain 09/08/2018  ? Financial difficulties 11/26/2017  ? Medically noncompliant 04/13/2017  ? Port catheter in place 04/01/2016  ? Epigastric pain 02/10/2016  ? Gastritis without bleeding 01/22/2016  ? Arrhythmia 12/25/2015  ? Infection of eyelash follicle of left eye 41/93/7902  ? Poor dentition 10/16/2015  ? 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 Other constipation 05/16/2015  ? Neck pain on right side 01/28/2015  ? 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 Acquired hypothyroidism 06/09/2014  ? Malignant neoplasm of head and neck ([place]) 06/01/2014  ? Malignant neoplasm metastatic to lung ([place]) 06/01/2014  ?  Chronic fatigue syndrome 06/01/2014  ? 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 ? ?Stark Bray, PT ?12/04/2021, 8:53 AM ? ?[facility] ?[facility] @ [facility] ?[place], [place], [address] ?Phone: [phone]   Fax:  [phone] ? ?Name: Christian Simmons ?MRN: [id number] ?Date of Birth: [birth year] ? ? ? ?

## 2021-12-04 NOTE — Progress Notes (Signed)
? ?I, Christian Simmons, LAT, ATC, am serving as scribe for Christian Simmons. ? ?Christian Simmons is a 36 y.o. male who presents to [facility] at [facility] today for f/u of chronic R shoulder pain and to review his R shoulder MRI results.  He was last seen by Christian Simmons on 11/17/21 and was advised to con't PT previously prescribed for neck pain due to current nasopharyngeal cancer.  He had a complicated shoulder dislocation 8 years ago and has had R shoulder pain since.  Today, pt reports R shoulder is feeling about the same. Pt expressed being scared about the results of the MRI. ? ?He had a terrible experience with his cancer surgery and has right supraclavicular lymph node over 10 years ago. ? ?Diagnostic testing: R shoulder MRI- 12/01/21; R shoulder XR- 11/17/21; ? ?Pertinent review of systems: No fevers or chills ? ?Relevant historical information: Malignant neoplasm to the naris of pharynx metastatic to the lung and supraclavicular lymph nodes.  History of surgery to the right trapezius/sacral fibular area. ? ? ?Exam:  ?Pulse 76   Ht 5\' 9"  (1.753 m)   Wt 184 lb 12.8 oz (83.8 kg)   SpO2 97%   BMI 27.29 kg/m?  ?General: Well Developed, well nourished, and in no acute distress.  ? ?MSK: Right shoulder: Decreased shoulder range of motion.  Limited abduction and external rotation. ? ? ? ?Lab and Radiology Results ? ?EXAM: ?MR ARTHROGRAM OF THE RIGHT SHOULDER ?  ?TECHNIQUE: ?Multiplanar, multisequence MR imaging of the right shoulder was ?performed following the administration of intra-articular contrast. ?  ?CONTRAST:  See Injection Documentation. ?  ?COMPARISON:  Right shoulder x-rays dated November 17, 2021. ?  ?FINDINGS: ?Rotator cuff:  Intact without significant tendinosis. ?  ?Muscles:  No focal muscular atrophy or edema. ?  ?Biceps long head:  Intact and normally positioned. ?  ?Acromioclavicular Joint: The acromion is type I. There are minimal ?acromioclavicular degenerative changes. No  significant fluid or ?contrast is present in the subacromial/subdeltoid bursa. ?  ?Glenohumeral Joint: Distended with intra-articular contrast. No ?chondral defect. ?  ?Labrum: Chronic tear of the anterior and anterior inferior labrum ?from 2 to 4 o'clock with medial retraction and undermining of the ?periosteum (series 3, images 8-12). Remaining labrum is intact. ?  ?Bones: Chronic impaction fracture of the posterosuperior humeral ?head. The glenoid is intact. No acute fracture or dislocation. No ?suspicious bone lesion. ?  ?Other: None. ?  ?IMPRESSION: ?1. 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 ?2. Chronic impaction fracture of the posterosuperior humeral head. ?3. Intact rotator cuff. ?  ?  ?Electronically Signed ?  By: Christian Simmons M.D. ?  On: 12/02/2021 09:22 ?  ?I, Lynne Simmons, personally (independently) visualized and performed the interpretation of the images attached in this note. ? ? ? ? ?Assessment and Plan: ?36 y.o. male with right shoulder pain. ? ?Christian Simmons has multifactorial right shoulder pain and lack of range of motion.  Some of the lack of range of motion and pain are due to the trapezius surgery and periscapular and trapezius dysfunction.  However think a fair amount of his shoulder dysfunction is due to the anterior labrum tear seen on MRI arthrogram.  He has a feeling of instability that I think is limiting his ability to improve over the last 10 years with physical therapy attempts.  I think it is worthwhile for him to have a surgical consultation to discuss the pros and cons of either a labrum repair or  labral debridement surgery.  In the meantime he should continue physical therapy.  Recheck back with me as needed. ? ?We reviewed the MRI results and discussed surgical options. ?Total encounter time 20 minutes including face-to-face time with the patient and, reviewing past medical record, and charting on the date of service.   ? ? ? ?PDMP not reviewed this  encounter. ?Orders Placed This Encounter  ?Procedures  ? Ambulatory referral to Orthopedic Surgery  ?  Referral Priority:   Routine  ?  Referral Type:   Surgical  ?  Referral Reason:   Specialty Services Required  ?  Referred to Provider:   Vanetta Mulders, MD  ?  Requested Specialty:   Orthopedic Surgery  ?  Number of Visits Requested:   1  ? ?No orders of the defined types were placed in this encounter. ? ? ? ?Discussed warning signs or symptoms. Please see discharge instructions. Patient expresses understanding. ? ? ?The above documentation has been reviewed and is accurate and complete Lynne Simmons, M.D. ? ? ?

## 2021-12-05 ENCOUNTER — Inpatient Hospital Stay: Payer: Medicaid Other | Attending: Hematology and Oncology

## 2021-12-05 ENCOUNTER — Inpatient Hospital Stay: Payer: Medicaid Other | Admitting: Hematology and Oncology

## 2021-12-05 ENCOUNTER — Encounter: Payer: Self-pay | Admitting: Hematology and Oncology

## 2021-12-05 ENCOUNTER — Inpatient Hospital Stay: Payer: Medicaid Other

## 2021-12-05 DIAGNOSIS — C760 Malignant neoplasm of head, face and neck: Secondary | ICD-10-CM | POA: Diagnosis present

## 2021-12-05 DIAGNOSIS — Z7189 Other specified counseling: Secondary | ICD-10-CM

## 2021-12-05 DIAGNOSIS — G893 Neoplasm related pain (acute) (chronic): Secondary | ICD-10-CM

## 2021-12-05 DIAGNOSIS — Z79899 Other long term (current) drug therapy: Secondary | ICD-10-CM | POA: Diagnosis not present

## 2021-12-05 DIAGNOSIS — Z95828 Presence of other vascular implants and grafts: Secondary | ICD-10-CM

## 2021-12-05 DIAGNOSIS — E119 Type 2 diabetes mellitus without complications: Secondary | ICD-10-CM | POA: Diagnosis not present

## 2021-12-05 DIAGNOSIS — Z5112 Encounter for antineoplastic immunotherapy: Secondary | ICD-10-CM | POA: Diagnosis present

## 2021-12-05 DIAGNOSIS — C7800 Secondary malignant neoplasm of unspecified lung: Secondary | ICD-10-CM

## 2021-12-05 LAB — TSH: TSH: 0.833 u[IU]/mL (ref 0.320–4.118)

## 2021-12-05 LAB — CMP (CANCER CENTER ONLY)
ALT: 33 U/L (ref 0–44)
AST: 19 U/L (ref 15–41)
Albumin: 4.9 g/dL (ref 3.5–5.0)
Alkaline Phosphatase: 56 U/L (ref 38–126)
Anion gap: 8 (ref 5–15)
BUN: 19 mg/dL (ref 6–20)
CO2: 27 mmol/L (ref 22–32)
Calcium: 10.1 mg/dL (ref 8.9–10.3)
Chloride: 103 mmol/L (ref 98–111)
Creatinine: 0.85 mg/dL (ref 0.61–1.24)
GFR, Estimated: >60 mL/min (ref >60)
Glucose, Bld: 139 mg/dL — ABNORMAL HIGH (ref 70–99)
Potassium: 3.9 mmol/L (ref 3.5–5.1)
Sodium: 138 mmol/L (ref 135–145)
Total Bilirubin: 0.4 mg/dL (ref 0.3–1.2)
Total Protein: 7.5 g/dL (ref 6.5–8.1)

## 2021-12-05 LAB — CBC WITH DIFFERENTIAL (CANCER CENTER ONLY)
Abs Immature Granulocytes: 0.05 10*3/uL (ref 0.00–0.07)
Basophils Absolute: 0 10*3/uL (ref 0.0–0.1)
Basophils Relative: 0 %
Eosinophils Absolute: 0 10*3/uL (ref 0.0–0.5)
Eosinophils Relative: 0 %
HCT: 44.1 % (ref 39.0–52.0)
Hemoglobin: 15.1 g/dL (ref 13.0–17.0)
Immature Granulocytes: 1 %
Lymphocytes Relative: 14 %
Lymphs Abs: 1.4 10*3/uL (ref 0.7–4.0)
MCH: 29.9 pg (ref 26.0–34.0)
MCHC: 34.2 g/dL (ref 30.0–36.0)
MCV: 87.3 fL (ref 80.0–100.0)
Monocytes Absolute: 0.8 10*3/uL (ref 0.1–1.0)
Monocytes Relative: 8 %
Neutro Abs: 7.7 10*3/uL (ref 1.7–7.7)
Neutrophils Relative %: 77 %
Platelet Count: 243 10*3/uL (ref 150–400)
RBC: 5.05 MIL/uL (ref 4.22–5.81)
RDW: 13.7 % (ref 11.5–15.5)
WBC Count: 10 10*3/uL (ref 4.0–10.5)
nRBC: 0 % (ref 0.0–0.2)

## 2021-12-05 MED ORDER — SODIUM CHLORIDE 0.9% FLUSH
10.0000 mL | Freq: Once | INTRAVENOUS | Status: AC
  Administered 2021-12-05: 10 mL

## 2021-12-05 MED ORDER — SODIUM CHLORIDE 0.9 % IV SOLN
400.0000 mg | Freq: Once | INTRAVENOUS | Status: AC
  Administered 2021-12-05: 400 mg via INTRAVENOUS
  Filled 2021-12-05: qty 16

## 2021-12-05 MED ORDER — SODIUM CHLORIDE 0.9 % IV SOLN: Freq: Once | INTRAVENOUS | Status: AC

## 2021-12-05 MED ORDER — ONDANSETRON HCL 4 MG/2ML IJ SOLN
8.0000 mg | Freq: Once | INTRAMUSCULAR | Status: AC
  Administered 2021-12-05: 8 mg via INTRAVENOUS
  Filled 2021-12-05: qty 4

## 2021-12-05 MED ORDER — SODIUM CHLORIDE 0.9% FLUSH
10.0000 mL | INTRAVENOUS | Status: DC | PRN
  Administered 2021-12-05: 10 mL

## 2021-12-05 NOTE — Patient Instructions (Signed)
Christian Simmons   ?Discharge Instructions: ?Thank you for choosing [facility] to provide your oncology and hematology care.  ? ?If you have a lab appointment with the [facility], please go directly to [facility] and check in at the registration area. ?  ?Wear comfortable clothing and clothing appropriate for easy access to any Portacath or PICC line.  ? ?We strive to give you quality time with your provider. You may need to reschedule your appointment if you arrive late (15 or more minutes).  Arriving late affects you and other patients whose appointments are after yours.  Also, if you miss three or more appointments without notifying the office, you may be dismissed from the clinic at the provider?s discretion.    ?  ?For prescription refill requests, have your pharmacy contact our office and allow 72 hours for refills to be completed.   ? ?Today you received the following chemotherapy and/or immunotherapy agents: pembrolizumab    ?  ?To help prevent nausea and vomiting after your treatment, we encourage you to take your nausea medication as directed. ? ?BELOW ARE SYMPTOMS THAT SHOULD BE REPORTED IMMEDIATELY: ?*FEVER GREATER THAN 100.4 F (38 ?C) OR HIGHER ?*CHILLS OR SWEATING ?*NAUSEA AND VOMITING THAT IS NOT CONTROLLED WITH YOUR NAUSEA MEDICATION ?*UNUSUAL SHORTNESS OF BREATH ?*UNUSUAL BRUISING OR BLEEDING ?*URINARY PROBLEMS (pain or burning when urinating, or frequent urination) ?*BOWEL PROBLEMS (unusual diarrhea, constipation, pain near the anus) ?TENDERNESS IN MOUTH AND THROAT WITH OR WITHOUT PRESENCE OF ULCERS (sore throat, sores in mouth, or a toothache) ?UNUSUAL RASH, SWELLING OR PAIN  ?UNUSUAL VAGINAL DISCHARGE OR ITCHING  ? ?Items with * indicate a potential emergency and should be followed up as soon as possible or go to the Emergency Department if any problems should occur. ? ?Please show the CHEMOTHERAPY ALERT CARD or IMMUNOTHERAPY ALERT CARD at  check-in to the Emergency Department and triage nurse. ? ?Should you have questions after your visit or need to cancel or reschedule your appointment, please contact [facility]  Dept: [phone]  and follow the prompts.  Office hours are 8:00 a.m. to 4:30 p.m. Monday - Friday. Please note that voicemails left after 4:00 p.m. may not be returned until the following business day.  We are closed weekends and major holidays. You have access to a nurse at all times for urgent questions. Please call the main number to the clinic Dept: [phone] and follow the prompts. ? ? ?For any non-urgent questions, you may also contact your provider using MyChart. We now offer e-Visits for anyone 76 and older to request care online for non-urgent symptoms. For details visit [email or web address]. ?  ?Also download the MyChart app! Go to the app store, search "MyChart", open the app, select [facility], and log in with your MyChart username and password. ? ?Due to Covid, a mask is required upon entering the hospital/clinic. If you do not have a mask, one will be given to you upon arrival. For doctor visits, patients may have 1 support person aged 55 or older with them. For treatment visits, patients cannot have anyone with them due to current Covid guidelines and our immunocompromised population.  ? ?

## 2021-12-05 NOTE — Assessment & Plan Note (Signed)
We discussed the importance of dietary modification and management of diabetes ?I encouraged the patient to continue taking metformin ?

## 2021-12-05 NOTE — Assessment & Plan Note (Signed)
He has chronic neck pain secondary to malignancy ?He is tolerating oxycodone taper, will continue to work with him to reduce pain medicine requirement ?

## 2021-12-05 NOTE — Progress Notes (Signed)
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   All questions were answered. The patient knows to call the clinic with any problems, questions or concerns. The total time spent in the appointment was 20 minutes encounter with patients including review of chart and various tests results, discussions about plan of care and coordination of care plan   [facility] 12/05/2021 5:06 PM  INTERVAL HISTORY: Please see below for problem oriented charting. he returns for treatment follow-up on pembrolizumab for recurrent head and neck cancer He is doing well He had recent MRI of the shoulder for evaluation of frozen shoulder He was recommended surgery His pain is well controlled  REVIEW OF SYSTEMS:   Constitutional: Denies fevers, chills or abnormal weight loss Eyes: Denies blurriness of vision Ears, nose, mouth, throat, and face:  Denies mucositis or sore throat Respiratory: Denies cough, dyspnea or wheezes Cardiovascular: Denies palpitation, chest discomfort or lower extremity swelling Gastrointestinal:  Denies nausea, heartburn or change in bowel habits Skin: Denies abnormal skin rashes Lymphatics: Denies new lymphadenopathy or easy bruising Neurological:Denies numbness, tingling or new weaknesses Behavioral/Psych: Mood is stable, no new changes  All other systems were reviewed with the patient and are negative.  I have reviewed the past medical history, past surgical history, social history and family history with the patient and they are unchanged from previous note.  ALLERGIES:  is allergic to phenergan [promethazine hcl], heparin, and clindamycin.  MEDICATIONS:  Current Outpatient Medications  Medication Sig Dispense Refill   blood glucose meter kit and supplies KIT Dispense based on patient and insurance preference. Use up to four times daily as directed. (FOR ICD-9 250.00, 250.01). 1 each 1   cetirizine (ZYRTEC) 10 MG tablet Take 1 tablet (10 mg total) by mouth daily as needed for allergies. 100 tablet 0   levothyroxine (SYNTHROID) 125 MCG tablet Take 1 tablet (125 mcg total) by mouth daily before breakfast. 60 tablet 1   metFORMIN (GLUCOPHAGE) 500 MG tablet Take 1 tablet (500 mg total) by mouth 2 (two) times daily with a meal. 60 tablet 2   ondansetron (ZOFRAN) 8 MG tablet Take 1 tablet (8 mg total) by mouth every 8 (eight) hours as needed for nausea or vomiting. 90 tablet 1   oxyCODONE (OXY IR/ROXICODONE) 5 MG immediate release tablet Take 1 tablet (5 mg total) by mouth 3 (three) times daily as needed for severe pain. 90 tablet 0   No current facility-administered medications for this visit.   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 Pan-endoscopy elsewhere showed cancer from nasopharyngeal space.   01/04/2007 - 02/20/2007 Chemotherapy   He received 2 cycles of cisplatin and 5FU followed by concurrent chemo with weekly cisplatin and radiation. He only received 2 doses of chemo due to severe mucositis, nausea and weight loss.   04/05/2007 - 08/04/2007 Chemotherapy   He received 4 more courses of cisplatin with 5FU and had complete response   07/05/2009 Procedure   Fine-needle aspirate of the right level II lymph nodes come from recurrent metastatic disease. Repeat endoscopy and CT scan show no evidence of disease elsewhere.   07/08/2009 - 12/02/2009 Chemotherapy   He was given 6 cycles of carboplatin, 5-FU and docetaxel   12/03/2009 Surgery   He has surgery to the residual lymph node on the right neck which showed no evidence of disease.   02/22/2012 Imaging   Repeat imaging study showed large recurrent mass. He was referred elsewhere for further treatment.   05/03/2012 Surgery   He underwent left upper lobectomy.   04/29/2013 Imaging   PEt scan showed lesion on right level II B and lower lung was abnormal   06/03/2013 - 02/02/2014 Chemotherapy   He had 6 cycles of chemotherapy when he was  found to have recurrence of cancer and had received oxaliplatin and capecitabine   06/07/2014 Imaging   PET CT scan showed persistent disease in the right neck lymph nodes and left lung   06/29/2014 Procedure   Accession: [id number] repeat LUL biopsy confirmed metastatic cancer   07/18/2014 - 07/31/2014 Radiation Therapy   He received palliative radiation therapy to the lungs   10/10/2014 Imaging   CT scan of the chest, abdomen and pelvis show regression in the size of the lung nodule in the left upper lobe and stable pulmonary nodules   01/24/2015 Imaging   CT scan showed stable disease in neck and lung   06/19/2015 Imaging   CT scan of the neck and the chest show possible mild progression of the nodule in the right side of the neck.   06/25/2015 Imaging   PET scan confirmed disease recurrence in the neck   07/07/2015 Imaging   He had MRI neck at [facility]   09/03/2015 - 08/26/2018 Chemotherapy   He received palliative chemo with Nivolumab   10/29/2015 Imaging   PET CT showed positive response to Rx   02/28/2016 Imaging   Ct abdomen showed abnormal thinkening in his stomach   03/03/2016 Imaging   CT: Right sternocleidomastoid muscle  metastasis appears less distinct but otherwise not significantly changed in size or configuration since 06/19/2015.2. Left level 3 lymph node which was hypermetabolic by PET-CT in January 2017 appears slightly smaller   04/01/2016 Imaging   CT cervical spine showed no acute fracture or traumatic malalignment in the cervical spine   04/22/2016 Procedure   Port-a-cath placed.   06/16/2016 Imaging   Ct neck showed right sternocleidomastoid muscle metastasis is further decreased in conspicuity since May, and has mildly decreased in size since September 2016. Continued stability of sub-centimeter left cervical lymph nodes. No new or progressive metastatic disease in the neck.   06/16/2016 Imaging   CT chest showed stable masslike radiation fibrosis in the left  upper lobe. Stable subcentimeter pulmonary nodules in the bilateral lower lobes. No new or progressive metastatic disease in the chest. Nonobstructing left renal stone.   10/13/2016 Imaging   Ct neck showed unchanged right sternocleidomastoid muscle metastasis. Unchanged subcentimeter left cervical lymph nodes. No evidence of new or progressive metastatic disease in the neck.   10/13/2016 Imaging   CT chest showed tiny hypervascular foci in the liver, not definitely seen on prior imaging of 06/16/2016 and 02/28/2016. Abdomen MRI without and with contrast recommended to further evaluate as metastatic disease is a concern. 2. Stable appearance of post treatment changes left upper lung and scattered tiny bilateral pulmonary nodules.   02/11/2017 Imaging   Ct neck: Lymph node mass right posterior neck appears improved from the prior study. Small posterior lymph nodes on the left unchanged. Occluded right jugular vein unchanged.   02/11/2017 Imaging   1. Similar appearance of postsurgical and radiation changes in the left upper lobe. 2. Similar bilateral pulmonary nodules. 3. No thoracic adenopathy. 4. Subtle foci of post-contrast enhancement within the liver are suboptimally characterized on this nondedicated study. Likely similar. These could either be re-evaluated at followup or more entirely characterized with abdominal MRI. 5. Left nephrolithiasis.   05/19/2017 Imaging   Matted lymph node mass right posterior neck appears larger in the recent CT. Accurate measurements difficult due to infiltrating tumor margins and infiltration of the muscle. Right jugular vein again appears occluded or resected. Small left posterior lymph nodes stable. Left upper lobe airspace density stable and similar to the prior CT   06/03/2017 PET scan   1. Hypermetabolic ill-defined right level IIb lymph node, about 1.3 cm in diameter with maximum SUV 9.5 (formerly 8.1). Appearance suspicious for residual/recurrent malignancy.  No worrisome left-sided lesion. 2. Left suprahilar indistinct opacity demonstrates no worrisome hypermetabolic activity. The 5 mm left lower lobe pulmonary nodule is stable and not currently hypermetabolic although below sensitive PET-CT size thresholds. 3. Other imaging findings of potential clinical significance: Bilateral nonobstructive nephrolithiasis. Chronic bilateral maxillary sinusitis.   05/11/2018 PET scan   1. Continued chronic accentuated metabolic activity in the vicinity of right level IIB and the adjacent right sternocleidomastoid muscle, with ill definition of surrounding tissue planes. Maximum SUV is currently 8.1, formerly 9.5. Accentuated metabolic activity is been present in this vicinity back through 06/25/2015, and there was also some low-level activity in this vicinity on 06/07/2014. Some of this may be from scarring and local muscular activity although clearly a component of residual tumor is difficult to exclude given the focally high activity. 2. Other imaging findings of potential clinical significance: Chronic bilateral maxillary sinusitis. Chronic scarring in the left upper lobe. Chronically stable 5 mm left lower lobe nodule is considered benign. Nonobstructive left nephrolithiasis.   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 COMMENT:No significant keratinization is identified. Other basaloid carcinomas are in the differential diagnosis. No cell block material is available for further testing.   09/12/2018 Procedure   He underwent fine Needle Aspiration   10/04/2018 PET scan   1. Significant progression of local recurrence laterally in the mid right neck with an enlarging, increasingly hypermetabolic soft tissue mass. This involves the right sternocleidomastoid muscle. 2. Small lymph nodes in the right axilla are  increasingly hypermetabolic. These are nonspecific and potentially reactive, although could reflect a small metastases. Small hypermetabolic nodule in the left suprasternal notch is unchanged. 3. No other evidence of metastatic disease.     10/07/2018 - 12/23/2018 Chemotherapy   The patient had cisplatin plus gemzar   12/07/2018 Imaging   1. Decreased size of lateral right neck mass. 2. Unchanged soft tissue nodule in the suprasternal notch. 3. No evidence of new metastatic disease in the neck.     05/30/2019 Imaging   CT neck No clear change or progression compared to the study of March. Overall measurements of the right lateral neck mass are similar, approximately 3 x 1.8 cm. See above discussion. One could argue that there is slight increase in lateral bulging, possibly with an increase in contrast enhancement, towards the inferior margin. This is of questionable validity but could possibly represent some progression or inflammatory change. Other findings in the region are stable.   07/20/2019 - 09/15/2019 Chemotherapy   The patient had dexamethasone (DECADRON) 4 MG tablet, 1 of 1 cycle, Start date: --, End date: -- palonosetron (ALOXI) injection 0.25 mg, 0.25 mg, Intravenous,  Once, 3 of 4 cycles Administration: 0.25 mg (07/20/2019), 0.25 mg (08/10/2019), 0.25 mg (09/08/2019) CISplatin (PLATINOL) 84 mg in sodium chloride 0.9 % 250 mL chemo infusion, 40 mg/m2 = 84 mg (80 % of original dose 50 mg/m2), Intravenous,  Once, 3 of 4 cycles Dose modification: 40 mg/m2 (80 % of original dose 50 mg/m2, Cycle 1, Reason: Dose Not Tolerated) Administration: 84 mg (07/20/2019), 84 mg (08/10/2019), 83 mg (09/08/2019) gemcitabine (GEMZAR) 1,600 mg in sodium chloride 0.9 % 250 mL chemo infusion, 1,672 mg (80 % of original dose 1,000 mg/m2), Intravenous,  Once, 3 of 4 cycles Dose modification: 800 mg/m2 (80 % of original dose 1,000 mg/m2, Cycle 1, Reason: Provider Judgment) Administration: 1,600 mg (07/20/2019),  1,600 mg (07/27/2019), 1,600 mg (08/10/2019), 1,600 mg (08/17/2019), 1,600 mg (09/08/2019), 1,672 mg (09/15/2019) ondansetron (ZOFRAN) 8 mg, dexamethasone (DECADRON) 10 mg in sodium chloride 0.9 % 50 mL IVPB, , Intravenous,  Once, 3 of 4 cycles Administration:  (09/15/2019) fosaprepitant (EMEND) 150 mg, dexamethasone (DECADRON) 12 mg in sodium chloride 0.9 % 145 mL IVPB, , Intravenous,  Once, 3 of 4 cycles Administration:  (07/20/2019),  (08/10/2019),  (09/08/2019)   for chemotherapy treatment.     10/02/2019 Imaging   CT neck As compared to 05/30/2019, no significant interval change in size of an ill-defined mass within the right lateral neck, again measuring 3.3 x 1.8 cm in transaxial dimensions.   Unchanged mildly enlarged left level I lymph node measuring 1.1 cm in short axis.   Unchanged node or nodule at the thoracic inlet, measuring 1.3 x 0.8 cm.   Please refer to concurrently performed chest CT for a description of findings below the level of the thoracic inlet.     10/02/2019 Imaging   CT  chest 1. No new or progressive findings in the chest to suggest metastatic disease. 2. Bilateral subcentimeter solid pulmonary nodules are stable since 2018. 3. Hyperdense 1.1 cm anterior liver focus, not clearly visualized on prior studies. Suggest MRI abdomen without and with IV contrast for further characterization.   10/20/2019 - 12/29/2019 Chemotherapy   The patient had ondansetron (ZOFRAN) injection 8 mg, 8 mg (100 % of original dose 8 mg), Intravenous,  Once, 2 of 5 cycles Dose modification: 8 mg (original dose 8 mg, Cycle 2) Administration: 8 mg (11/17/2019), 8 mg (12/15/2019), 8 mg (12/29/2019) gemcitabine (GEMZAR) 2,000 mg in sodium chloride 0.9 % 250 mL chemo infusion, 2,090 mg, Intravenous,  Once, 3 of 6 cycles Administration: 2,000 mg (10/20/2019), 2,000 mg (11/03/2019), 2,000 mg (11/17/2019), 2,000 mg (12/15/2019), 2,000 mg (12/29/2019)   for chemotherapy treatment.     06/12/2020 Imaging    1. Enlarging superficial, exophytic component of the chronic right sternocleidomastoid muscle mass. See series 6, image 55. 2. Elsewhere stable CT appearance of the Neck.   06/12/2020 Imaging   Post treatment scarring in the left hemithorax, stable. No evidence recurrent or metastatic disease   06/14/2020 - 10/15/2020 Chemotherapy   He received carboplatin, 5FU and Beryle Flock        10/31/2020 Procedure   Interval improvement in right lateral lymph node mass. Improvement in dermal component as well as invasion of the right sternocleidomastoid muscle.   10 mm submental lymph node slightly enlarged compared to the prior study. Continued follow-up recommended.   11/01/2020 -  Chemotherapy   Patient is on Treatment Plan : HEAD/NECK Pembrolizumab Q21D       PHYSICAL EXAMINATION: ECOG PERFORMANCE STATUS: 1 - Symptomatic but completely ambulatory  Vitals:   12/05/21 1221  BP: (!) 143/78  Pulse: 84  Resp: 18  Temp: 99.3 F (37.4 C)  SpO2: 98%   Filed Weights   12/05/21 1221  Weight: 181 lb 6.4 oz (82.3 kg)    GENERAL:alert, no distress and comfortable SKIN: skin color, texture, turgor are normal, no rashes or significant lesions EYES: normal, Conjunctiva are pink and non-injected, sclera clear OROPHARYNX:no exudate, no erythema and lips, buccal mucosa, and tongue normal  NECK: Noted well-healed surgical scar on the right with fibrosis but no abnormal lesions LYMPH:  no palpable lymphadenopathy in the cervical, axillary or inguinal LUNGS: clear to auscultation and percussion with normal breathing effort HEART: regular rate & rhythm and no murmurs and no lower extremity edema ABDOMEN:abdomen soft, non-tender and normal bowel sounds Musculoskeletal:no cyanosis of digits and no clubbing  NEURO: alert & oriented x 3 with fluent speech, no focal motor/sensory deficits  LABORATORY DATA:  I have reviewed the data as listed    Component Value Date/Time   NA 138 12/05/2021 1157   NA  139 09/22/2017 0829   K 3.9 12/05/2021 1157   K 3.5 09/22/2017 0829   CL 103 12/05/2021 1157   CO2 27 12/05/2021 1157   CO2 26 09/22/2017 0829   GLUCOSE 139 (H) 12/05/2021 1157   GLUCOSE 133 09/22/2017 0829   BUN 19 12/05/2021 1157   BUN 14.1 09/22/2017 0829   CREATININE 0.85 12/05/2021 1157   CREATININE 0.9 09/22/2017 0829   CALCIUM 10.1 12/05/2021 1157   CALCIUM 9.1 09/22/2017 0829   PROT 7.5 12/05/2021 1157   PROT 6.8 09/22/2017 0829   ALBUMIN 4.9 12/05/2021 1157   ALBUMIN 4.1 09/22/2017 0829   AST 19 12/05/2021 1157   AST 22 09/22/2017 0829   ALT  33 12/05/2021 1157   ALT 30 09/22/2017 0829   ALKPHOS 56 12/05/2021 1157   ALKPHOS 55 09/22/2017 0829   BILITOT 0.4 12/05/2021 1157   BILITOT 0.35 09/22/2017 0829   GFRNONAA >60 12/05/2021 1157   GFRAA >60 07/05/2020 0845   GFRAA >60 02/06/2019 1215    No results found for: SPEP, UPEP  Lab Results  Component Value Date   WBC 10.0 12/05/2021   NEUTROABS 7.7 12/05/2021   HGB 15.1 12/05/2021   HCT 44.1 12/05/2021   MCV 87.3 12/05/2021   PLT 243 12/05/2021      Chemistry      Component Value Date/Time   NA 138 12/05/2021 1157   NA 139 09/22/2017 0829   K 3.9 12/05/2021 1157   K 3.5 09/22/2017 0829   CL 103 12/05/2021 1157   CO2 27 12/05/2021 1157   CO2 26 09/22/2017 0829   BUN 19 12/05/2021 1157   BUN 14.1 09/22/2017 0829   CREATININE 0.85 12/05/2021 1157   CREATININE 0.9 09/22/2017 0829      Component Value Date/Time   CALCIUM 10.1 12/05/2021 1157   CALCIUM 9.1 09/22/2017 0829   ALKPHOS 56 12/05/2021 1157   ALKPHOS 55 09/22/2017 0829   AST 19 12/05/2021 1157   AST 22 09/22/2017 0829   ALT 33 12/05/2021 1157   ALT 30 09/22/2017 0829   BILITOT 0.4 12/05/2021 1157   BILITOT 0.35 09/22/2017 0829       RADIOGRAPHIC STUDIES: I have personally reviewed the radiological images as listed and agreed with the findings in the report. DG Shoulder Right  Result Date: 11/17/2021 CLINICAL DATA:  Right shoulder  pain EXAM: RIGHT SHOULDER - 2+ VIEW COMPARISON:  None. FINDINGS: The patient has a right Port-A-Cath, incompletely evaluated. No bony abnormalities. No fracture or dislocation in the shoulder. No bony lesion or significant degenerative change. IMPRESSION: No cause for right shoulder pain identified. Electronically Signed   By: Dorise Bullion III M.D.   On: 11/17/2021 12:16   MR Shoulder Right W Contrast  Result Date: 12/02/2021 CLINICAL DATA:  Right shoulder pain and limited range of motion. History of dislocation in 2013. No recent injury. EXAM: MR ARTHROGRAM OF THE RIGHT SHOULDER TECHNIQUE: Multiplanar, multisequence MR imaging of the right shoulder was performed following the administration of intra-articular contrast. CONTRAST:  See Injection Documentation. COMPARISON:  Right shoulder x-rays dated November 17, 2021. FINDINGS: Rotator cuff:  Intact without significant tendinosis. Muscles:  No focal muscular atrophy or edema. Biceps long head:  Intact and normally positioned. Acromioclavicular Joint: The acromion is type I. There are minimal acromioclavicular degenerative changes. No significant fluid or contrast is present in the subacromial/subdeltoid bursa. Glenohumeral Joint: Distended with intra-articular contrast. No chondral defect. Labrum: Chronic tear of the anterior and anterior inferior labrum from 2 to 4 o'clock with medial retraction and undermining of the periosteum (series 3, images 8-12). Remaining labrum is intact. Bones: Chronic impaction fracture of the posterosuperior humeral head. The glenoid is intact. No acute fracture or dislocation. No suspicious bone lesion. Other: None. IMPRESSION: 1. 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 2. Chronic impaction fracture of the posterosuperior humeral head. 3. Intact rotator cuff. Electronically Signed   By: Titus Dubin M.D.   On: 12/02/2021 09:22   [place] LIMITED JOINT SPACE STRUCTURES UP RIGHT  Result Date:  12/01/2021 Procedure: Real-time Ultrasound Guided gadolinium contrast injection of right glenohumeral joint Device: Samsung [id number] Verbal informed consent obtained. Time-out conducted. Noted no overlying erythema, induration, or other signs  of local infection. Skin prepped in a sterile fashion. Local anesthesia: Topical Ethyl chloride. With sterile technique and under real time ultrasound guidance: Noted Hill-Sachs lesion, advanced a 22-gauge spinal needle into the glenohumeral joint from a posterior approach, injected 1 cc kenalog 40, 2 cc lidocaine, 2 cc bupivacaine, syringe switched and 0.1 cc gadolinium injected, syringe again switched and 10 cc sterile saline used to fully distend the joint. Joint visualized and capsule seen distending confirming intra-articular placement of contrast material and medication. Completed without difficulty Advised to call if fevers/chills, erythema, induration, drainage, or persistent bleeding. Images permanently stored in PACS Impression: Technically successful ultrasound guided gadolinium contrast injection for MR arthrography.  Please see separate MR arthrogram report.

## 2021-12-05 NOTE — Assessment & Plan Note (Signed)
Clinically, his examination is benign ?There is no role for CT imaging at this point ?He will continue pembrolizumab every 6 weeks ?We will continue to monitor closely for toxicity ?

## 2021-12-08 ENCOUNTER — Ambulatory Visit (INDEPENDENT_AMBULATORY_CARE_PROVIDER_SITE_OTHER): Payer: Medicaid Other | Admitting: Family Medicine

## 2021-12-08 ENCOUNTER — Other Ambulatory Visit: Payer: Self-pay

## 2021-12-08 VITALS — HR 76 | Ht 69.0 in | Wt 184.8 lb

## 2021-12-08 DIAGNOSIS — G8929 Other chronic pain: Secondary | ICD-10-CM | POA: Diagnosis not present

## 2021-12-08 DIAGNOSIS — M25511 Pain in right shoulder: Secondary | ICD-10-CM | POA: Diagnosis not present

## 2021-12-08 NOTE — Patient Instructions (Addendum)
Thank you for coming in today.  ? ?I've referred you to orthopedic surgery, Dr. Sammuel Hines for a consultation. If you don't here from them about scheduling within 1 week, please let me know.  ? ?Recheck back with me in 6 weeks ? ? ?

## 2021-12-09 ENCOUNTER — Ambulatory Visit: Payer: Medicaid Other | Admitting: Physical Therapy

## 2021-12-09 ENCOUNTER — Encounter: Payer: Self-pay | Admitting: Physical Therapy

## 2021-12-09 DIAGNOSIS — M25611 Stiffness of right shoulder, not elsewhere classified: Secondary | ICD-10-CM

## 2021-12-09 DIAGNOSIS — R29898 Other symptoms and signs involving the musculoskeletal system: Secondary | ICD-10-CM

## 2021-12-09 DIAGNOSIS — C119 Malignant neoplasm of nasopharynx, unspecified: Secondary | ICD-10-CM

## 2021-12-09 DIAGNOSIS — M542 Cervicalgia: Secondary | ICD-10-CM

## 2021-12-09 DIAGNOSIS — M436 Torticollis: Secondary | ICD-10-CM

## 2021-12-09 DIAGNOSIS — Z483 Aftercare following surgery for neoplasm: Secondary | ICD-10-CM

## 2021-12-09 NOTE — Therapy (Signed)
[facility] ?[facility] ?[place], [place], [address] ?Phone: [phone]   Fax:  [phone] ? ?Physical Therapy Treatment ? ?Patient Details  ?Name: Christian Simmons ?MRN: [id number] ?Date of Birth: May 11, 1986 ?Referring Provider (PT): Dr. Alvy Bimler ? ? ?Encounter Date: 12/09/2021 ? ? PT End of Session - 12/09/21 1011   ? ? Visit Number 9   ? Number of Visits 13   ? Date for PT Re-Evaluation 12/16/21   ? Authorization Type update medicaid auth as able   ? Authorization - Visit Number 2   ? Authorization - Number of Visits 6   ? PT Start Time [phone]   pt arrived late  ? PT Stop Time 0900   ? PT Time Calculation (min) 48 min   ? Activity Tolerance Patient tolerated treatment well   ? Behavior During Therapy [facility] for tasks assessed/performed   ? ?  ?  ? ?  ? ? ?Past Medical History:  ?Diagnosis Date  ? Arrhythmia 12/25/2015  ? Carcinoma ([place])   ? 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 Fatigue 06/01/2014  ? Hypothyroidism   ? Infection of eyelash follicle of left eye 12/03/6008  ? Metastasis to lung ([place]) 06/01/2014  ? 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 Neuropathy   ? Radiation 07/18/14-07/31/14  ? Left upper lobe  40 gy in 10 fractions  ? Seizures ([place])   ? 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  ? ? ?Past Surgical History:  ?Procedure Laterality Date  ? LUNG REMOVAL, PARTIAL  05/03/2012  ? left upper lobectomy  ? nasal biopsy    ? RADICAL NECK DISSECTION    ? VIDEO BRONCHOSCOPY N/A 06/29/2014  ? Procedure: VIDEO BRONCHOSCOPY ;  Surgeon: Melrose Nakayama, MD;  Location: [facility];  Service: Thoracic;  Laterality: N/A;  ? ? ?There were no vitals filed for this visit. ? ? Subjective Assessment - 12/09/21 0814   ? ? Subjective I have an appointment tomorrow with an orthopedic surgeon for my shoulder tomorrow.   ? Pertinent History Nasopharyngeal carcinoma diagnosed in 2008.  Chemotherapy completed.  Recurrence in 2010 lymph node with more  chemotherapy. Removal of Rt neck lymph node showing NED.  Recurrence 2013 with Lt upper lobectomy and then Rt lung lesion found 2014 with more chemotherapy (all previous treatment in [place]) .  Palliative radiation to lungs 2015. Palliative chemotherapy 2016-2019. (In [place])  2019: local recurrence Rt SCM and lymph nodes. Continuation of chemotherapy. Keytruda every 6 weeks.   ? Patient Stated Goals improve shoulder and neck movement   ? Currently in Pain? Yes   ? Pain Score 8    ? Pain Location Neck   ? Pain Orientation Right   ? Pain Descriptors / Indicators Pressure   ? Pain Type Acute pain   ? Pain Onset In the past 7 days   ? Pain Frequency Constant   ? Aggravating Factors  when pt is unable to sleep   ? Pain Relieving Factors sleeping   ? Effect of Pain on Daily Activities has to take more pain medication   ? ?  ?  ? ?  ? ? ? ? ? ? ? ? ? ? ? ? ? ? ? ? ? ? ? ? [facility] Adult PT Treatment/Exercise - 12/09/21 0001   ? ?  ? Shoulder Exercises: Supine  ? Other Supine Exercises attempted isometrics but stopped due to increased pain in neck with these   ?  ?  Manual Therapy  ? Soft tissue mobilization In sidelying to R posterior and lateral cervical muscles and suboccipital muslces where pt had the most pain. Then to supine for sub occipital release. In Supine with cocoa butter to R SCM and lateral cervical muscles, pts tenderness was much improved today at Rt SCM muscle belly   ? ?  ?  ? ?  ? ? ? ? ? ? ? ? ? ? ? ? ? ? ? PT Long Term Goals - 11/04/21 0912   ? ?  ? PT LONG TERM GOAL #1  ? Title Pt will demonstrate appropriate sitting posture without need for vcs/tcs   ? Time 6   ? Period Weeks   ? Status New   ?  ? PT LONG TERM GOAL #2  ? Title Pt will improve Rt shoulder abduction to at least 90deg to improve mobility   ? Baseline 60   ? Time 6   ? Period Weeks   ? Status New   ?  ? PT LONG TERM GOAL #3  ? Title Pt will be ind with final HEP for continued strengthening of the shoulder and postural muscles   ? Time 6   ?  Period Weeks   ? Status New   ? ?  ?  ? ?  ? ? ? ? ? ? ? ? Plan - 12/09/21 1012   ? ? Clinical Impression Statement Pt has an appointment tomorrow with the orthopedic surgeon regarding his right shoulder. He reports he recently received chemo and has been more sedentary since his treatment due to nausea and fatigue and in that time his neck has worsened greatly. Attempted shoulder isometrics today but this increased neck pain so much that it was stopped. Focused entire session on manual therapy and soft tissue mobilization to decrease neck pain spasms. Pt reports that by end of session he did not have any pain. Educated pt that a Radiographer, therapeutic might benefit him and he would be able to use it daily to keep his muscles from getting so tight. Showed pt different examples on [facility].   ? PT Frequency 2x / week   ? PT Duration 6 weeks   ? PT Treatment/Interventions Therapeutic exercise;ADLs/Self Care Home Management;Scar mobilization;Passive range of motion;Dry needling;Patient/family education;Electrical Stimulation;Moist Heat;Manual techniques;Therapeutic activities;Taping   ? PT Next Visit Plan Cont kinesiotape,  Cont postural education and TE, neck MT   ? PT Home Exercise Plan Access Code: [email or web address]; Meeks Decompression Exs   ? Consulted and Agree with Plan of Care Patient   ? ?  ?  ? ?  ? ? ?Patient will benefit from skilled therapeutic intervention in order to improve the following deficits and impairments:  Pain, Increased fascial restricitons, Increased muscle spasms, Decreased scar mobility, Impaired UE functional use, Decreased range of motion, Decreased strength, Impaired flexibility, Decreased skin integrity ? ?Visit Diagnosis: ?Cervicalgia ? ?Aftercare following surgery for neoplasm ? ?Stiffness of right shoulder, not elsewhere classified ? ?Neck stiffness ? ?Shoulder weakness ? 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 ? ? ? ?Problem List ?Patient Active Problem List  ? Diagnosis Date Noted  ? Anterior  dislocation of right shoulder 12/01/2021  ? Chronic limitation of movement of neck 10/26/2021  ? Anxiety, generalized 06/18/2020  ? Preventive measure 06/13/2020  ? Liver lesion 10/05/2019  ? Type 2 diabetes mellitus ([place]) 04/06/2019  ? 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 Vitamin B12 deficiency 03/02/2019  ? Pancytopenia,  acquired ([place]) 11/11/2018  ? 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 Nausea and vomiting 10/14/2018  ? Goals of care, counseling/discussion 09/21/2018  ? Neck mass 09/12/2018  ? Cancer associated pain 09/08/2018  ? Financial difficulties 11/26/2017  ? Medically noncompliant 04/13/2017  ? Port catheter in place 04/01/2016  ? Epigastric pain 02/10/2016  ? Gastritis without bleeding 01/22/2016  ? Arrhythmia 12/25/2015  ? Infection of eyelash follicle of left eye 15/94/5859  ? Poor dentition 10/16/2015  ? 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 Other constipation 05/16/2015  ? Neck pain on right side 01/28/2015  ? 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 Acquired hypothyroidism 06/09/2014  ? Malignant neoplasm of head and neck ([place]) 06/01/2014  ? Malignant neoplasm metastatic to lung ([place]) 06/01/2014  ? Chronic fatigue syndrome 06/01/2014  ? 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 ? ?Allyson Sabal [place], PT ?12/09/2021, 10:24 AM ? ?[facility] ?[facility] ?[place], [place], [address] ?Phone: [phone]   Fax:  [phone] ? ?Name: Christian Simmons ?MRN: [id number] ?Date of Birth: [birth year] ? ? ? ?

## 2021-12-11 ENCOUNTER — Encounter: Payer: Self-pay | Admitting: Rehabilitation

## 2021-12-12 ENCOUNTER — Other Ambulatory Visit: Payer: Self-pay

## 2021-12-12 ENCOUNTER — Ambulatory Visit: Payer: Medicaid Other | Admitting: Rehabilitation

## 2021-12-12 ENCOUNTER — Encounter: Payer: Self-pay | Admitting: Rehabilitation

## 2021-12-12 ENCOUNTER — Ambulatory Visit (HOSPITAL_BASED_OUTPATIENT_CLINIC_OR_DEPARTMENT_OTHER): Payer: Medicaid Other | Admitting: Orthopaedic Surgery

## 2021-12-12 DIAGNOSIS — R29898 Other symptoms and signs involving the musculoskeletal system: Secondary | ICD-10-CM

## 2021-12-12 DIAGNOSIS — Z483 Aftercare following surgery for neoplasm: Secondary | ICD-10-CM

## 2021-12-12 DIAGNOSIS — G8929 Other chronic pain: Secondary | ICD-10-CM | POA: Diagnosis not present

## 2021-12-12 DIAGNOSIS — M25511 Pain in right shoulder: Secondary | ICD-10-CM | POA: Diagnosis not present

## 2021-12-12 DIAGNOSIS — M436 Torticollis: Secondary | ICD-10-CM

## 2021-12-12 DIAGNOSIS — C119 Malignant neoplasm of nasopharynx, unspecified: Secondary | ICD-10-CM

## 2021-12-12 DIAGNOSIS — M542 Cervicalgia: Secondary | ICD-10-CM

## 2021-12-12 DIAGNOSIS — M25611 Stiffness of right shoulder, not elsewhere classified: Secondary | ICD-10-CM

## 2021-12-12 NOTE — Progress Notes (Signed)
? ?                            ? ? ?Chief Complaint: Right shoulder pain  ?  ? ? ?History of Present Illness:  ? ? ?Christian Simmons is a 36 y.o. male RHD male presents with right shoulder pain which has been ongoing for several years now.  He is sent as a referral from Dr. Georgina Snell.  He does have quite a complex medical history.  He states that he did have a previous shoulder dislocation in 2013 with subsequent reduction.  His care most recently has been complicated by a carcinoma involving the throat.  This is required multiple rounds of chemoradiation in this area with subsequent pain and fibrosis about the neck.  He presents today with predominantly pain in the upper trapezial region.  When further questioned about shoulder instability he does believe that the shoulder feels unstable although that being said when asked to elaborate he is pointing to his significant scapular winging as the etiology of what feels like a dislocation.  His goal for today's visit is to further explore what options can make him feel better not necessarily surgery.  He is not currently working.  He does enjoy being active although his neck and shoulder pain has become a major source of an activity for him. ? ? ? ?Surgical History:   ?None ? ?PMH/PSH/Family History/Social History/Meds/Allergies:   ? ?Past Medical History:  ?Diagnosis Date  ? Arrhythmia 12/25/2015  ? Carcinoma ([place])   ? 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 Fatigue 06/01/2014  ? Hypothyroidism   ? Infection of eyelash follicle of left eye 3/83/2919  ? Metastasis to lung ([place]) 06/01/2014  ? 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 Neuropathy   ? Radiation 07/18/14-07/31/14  ? Left upper lobe  40 gy in 10 fractions  ? Seizures ([place])   ? 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  ? ?Past Surgical History:  ?Procedure Laterality Date  ? LUNG REMOVAL, PARTIAL  05/03/2012  ? left upper lobectomy  ? nasal biopsy    ? RADICAL NECK DISSECTION    ? VIDEO BRONCHOSCOPY  N/A 06/29/2014  ? Procedure: VIDEO BRONCHOSCOPY ;  Surgeon: Melrose Nakayama, MD;  Location: [facility];  Service: Thoracic;  Laterality: N/A;  ? ?Social History  ? ?Socioeconomic History  ? Marital status: Married  ?  Spouse name: Not on file  ? Number of children: 1  ? Years of education: Not on file  ? Highest education level: Not on file  ?Occupational History  ? Not on file  ?Tobacco Use  ? Smoking status: Never  ? Smokeless tobacco: Never  ?Vaping Use  ? Vaping Use: Never used  ?Substance and Sexual Activity  ? Alcohol use: No  ? Drug use: No  ? Sexual activity: Not on file  ?Other Topics Concern  ? Not on file  ?Social History Narrative  ? ** Merged History Encounter **  ?    ? ?Social Determinants of Health  ? ?Financial Resource Strain: Not on file  ?Food Insecurity: Not on file  ?Transportation Needs: Not on file  ?Physical Activity: Not on file  ?Stress: Not on file  ?Social Connections: Not on file  ? ?Family History  ?Problem Relation Age of Onset  ? Hypertension Mother   ? Diabetes Mother   ? Hypertension Father   ? Diabetes Father   ? ?Allergies  ?  Allergen Reactions  ? Phenergan [Promethazine Hcl] Anxiety  ?  Causes patients HR to go into the 140's with severe anxiety  ? Heparin Other (See Comments)  ?  No Pork derivatives due to religion  ? Clindamycin Rash  ? ?Current Outpatient Medications  ?Medication Sig Dispense Refill  ? blood glucose meter kit and supplies KIT Dispense based on patient and insurance preference. Use up to four times daily as directed. (FOR ICD-9 250.00, 250.01). 1 each 1  ? cetirizine (ZYRTEC) 10 MG tablet Take 1 tablet (10 mg total) by mouth daily as needed for allergies. 100 tablet 0  ? levothyroxine (SYNTHROID) 125 MCG tablet Take 1 tablet (125 mcg total) by mouth daily before breakfast. 60 tablet 1  ? metFORMIN (GLUCOPHAGE) 500 MG tablet Take 1 tablet (500 mg total) by mouth 2 (two) times daily with a meal. 60 tablet 2  ? ondansetron (ZOFRAN) 8 MG tablet Take 1 tablet (8 mg  total) by mouth every 8 (eight) hours as needed for nausea or vomiting. 90 tablet 1  ? oxyCODONE (OXY IR/ROXICODONE) 5 MG immediate release tablet Take 1 tablet (5 mg total) by mouth 3 (three) times daily as needed for severe pain. 90 tablet 0  ? ?No current facility-administered medications for this visit.  ? ?Facility-Administered Medications Ordered in Other Visits  ?Medication Dose Route Frequency Provider Last Rate Last Admin  ? anticoagulant sodium citrate solution 5 mL  5 mL Intracatheter Once Alvy Bimler, Ni, MD      ? anticoagulant sodium citrate solution 5 mL  5 mL Intracatheter Once [name], MD      ? ?No results found. ? ?Review of Systems:   ?A ROS was performed including pertinent positives and negatives as documented in the HPI. ? ?Physical Exam :   ?Constitutional: NAD and appears stated age ?Neurological: Alert and oriented ?Psych: Appropriate affect and cooperative ?There were no vitals taken for this visit.  ? ?Comprehensive Musculoskeletal Exam:   ? ?Musculoskeletal Exam    ?Inspection Right Left  ?Skin Profound scapular winging No atrophy or winging  ?Palpation    ?Tenderness Upper trapezius None  ?Range of Motion    ?Flexion (passive) 170 170  ?Flexion (active) 170, although there is a pause at 90 with pain 170  ?Abduction 170, again with a positive at 90 with pain 170  ?ER at the side 70 70  ?Can reach behind back to T12 T12  ?Strength    ? Full Full  ?Special Tests    ?Pseudoparalytic No No  ?Neurologic    ?Fires PIN, radial, median, ulnar, musculocutaneous, axillary, suprascapular, long thoracic, and spinal accessory innervated muscles. No abnormal sensibility  ?Vascular/Lymphatic    ?Radial Pulse 2+ 2+  ?Cervical Exam    ?Patient has symmetric cervical range of motion with negative Spurling's test.  ?Special Test: Negative apprehension, 2+ anterior load shift, 1+ posterior load shift  ? ? ? ?Imaging:   ?Xray (3 views right shoulder): ?Normal ? ?MRI (right shoulder): ?Significant periosteal  sleeve avulsion of the labrum anteriorly which is likely chronic ? ?I personally reviewed and interpreted the radiographs. ? ? ?Assessment:   ?36 year old male with right upper trapezial pain.  At today's visit I have specifically discussed instability with him and his known labral tear.  Given his exam findings, I do believe that his scapular dyskinesis and winging is causing him the vast majority of his pain.  When he is getting to approximately 90 degrees because of his winging he is  impinging which I believe is causing pain.  Ultimately I believe this issue can be corrected with a dedicated physical therapy treatment program for serratus and interscalene strengthening.  He does have significant fibrosis of his upper trapezial region.  I do believe that he would be a good candidate for dry needling as well given this.  At this point I do not necessarily believe that surgery is off the table for fixation of his labrum, although I do believe that he should be optimized in terms of his scapular winging and dyskinesis.  At that point once we have improved this we will be able to better ascertain his instability symptoms.  On exam he does not have a floridly positive apprehension test which is leading me to believe that dyskinesis is more of his issue.  I will continue to follow him longitudinally to check his improvement as we work on his scapular dyskinesis. ? ?Plan :   ? ?-Return to clinic in 6 weeks ?-I would like him to attend physical therapy at [facility] to specifically work on dry needling as well as periscapular program to improve his shoulder dyskinesis ? ? ? ? ?I personally saw and evaluated the patient, and participated in the management and treatment plan. ? ?Vanetta Mulders, MD ?Attending Physician, Orthopedic Surgery ? ?This document was dictated using Systems analyst. A reasonable attempt at proof reading has been made to minimize errors. ?

## 2021-12-14 NOTE — Therapy (Signed)
[facility] ?[facility] ?[place], [place], [address] ?Phone: [phone]   Fax:  [phone] ? ?Physical Therapy Treatment ? ?Patient Details  ?Name: Christian Simmons ?MRN: [id number] ?Date of Birth: 1986-04-03 ?Referring Provider (PT): Dr. Alvy Bimler ? ? ?Encounter Date: 12/12/2021 ? ? ? ?Past Medical History:  ?Diagnosis Date  ? Arrhythmia 12/25/2015  ? Carcinoma ([place])   ? 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 Fatigue 06/01/2014  ? Hypothyroidism   ? Infection of eyelash follicle of left eye 2/58/5277  ? Metastasis to lung ([place]) 06/01/2014  ? Nasopharyngeal cancer ([address]) 06/01/2014  ? Neuropathy   ? Radiation 07/18/14-07/31/14  ? Left upper lobe  40 gy in 10 fractions  ? Seizures ([place])   ? 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  ? ? ?Past Surgical History:  ?Procedure Laterality Date  ? LUNG REMOVAL, PARTIAL  05/03/2012  ? left upper lobectomy  ? nasal biopsy    ? RADICAL NECK DISSECTION    ? VIDEO BRONCHOSCOPY N/A 06/29/2014  ? Procedure: VIDEO BRONCHOSCOPY ;  Surgeon: Melrose Nakayama, MD;  Location: [facility];  Service: Thoracic;  Laterality: N/A;  ? ? ?There were no vitals filed for this visit. ? ? ? ? ? ? ? ? ? ? ? ? ? ? ? ? ? ? ? ? ? [facility] Adult PT Treatment/Exercise - 12/14/21 0001   ? ?  ? Self-Care  ? Posture discussion prior to MT regarding orthopedic sugeon visit which was just prior to this visit.  Pt received a new referral for PT scapular strengthening and dry needling for [place].  Emailed Janett Billow hightower to have that referral sent our way or just omit as we can set up DN appointments here.  Surgeon speculates that most of the pain is from scapular dysfuction.   ?  ? Manual Therapy  ? Manual Therapy Passive ROM   ? Soft tissue mobilization In sidelying to R posterior and lateral cervical muscles and suboccipital muslces where pt had the most pain. Then to supine for sub occipital release. In Supine with cocoa butter to R SCM  and lateral cervical muscles   ? Passive ROM PROM with PT assisted stretch into rotation bil   ? ?  ?  ? ?  ? ? ? ? ? ? ? ? ? ? ? ? ? ? ? PT Long Term Goals - 11/04/21 0912   ? ?  ? PT LONG TERM GOAL #1  ? Title Pt will demonstrate appropriate sitting posture without need for vcs/tcs   ? Time 6   ? Period Weeks   ? Status New   ?  ? PT LONG TERM GOAL #2  ? Title Pt will improve Rt shoulder abduction to at least 90deg to improve mobility   ? Baseline 60   ? Time 6   ? Period Weeks   ? Status New   ?  ? PT LONG TERM GOAL #3  ? Title Pt will be ind with final HEP for continued strengthening of the shoulder and postural muscles   ? Time 6   ? Period Weeks   ? Status New   ? ?  ?  ? ?  ? ? ? ? ? ? ? ? Plan - 12/14/21 2158   ? ? Clinical Impression Statement Pt is in much more pain post infusion of Keytruda but reports it is lasting longer than normal.  Pain is pinpointed  to the Rt base of the skull at [facility] insertion?.  Pain then runs down the side of the neck following along incision.  Pt is still improved with STM and PT.  Hopefully Pt will have less pain to resume scapular, shoulder, and neck strengthening.  Will make sure referral for DN is sent to our clinic and DN will be added to [facility] next visit for goal update to ask for rmore visits.   ? PT Frequency 2x / week   ? PT Duration 6 weeks   ? PT Treatment/Interventions Therapeutic exercise;ADLs/Self Care Home Management;Scar mobilization;Passive range of motion;Dry needling;Patient/family education;Electrical Stimulation;Moist Heat;Manual techniques;Therapeutic activities;Taping   ? PT Next Visit Plan Cont kinesiotape,  Cont postural education and TE, neck MT - add DN to plan.  try more prone scapula strengthening   ? Consulted and Agree with Plan of Care Patient   ? ?  ?  ? ?  ? ? ?Patient will benefit from skilled therapeutic intervention in order to improve the following deficits and impairments:    ? ?Visit Diagnosis: ?Cervicalgia ? ?Aftercare following surgery for  neoplasm ? ?Stiffness of right shoulder, not elsewhere classified ? ?Neck stiffness ? ?Malignant neoplasm of nasopharynx ([place]) ? ?Shoulder weakness ? ? ? ? ?Problem List ?Patient Active Problem List  ? Diagnosis Date Noted  ? Anterior dislocation of right shoulder 12/01/2021  ? Chronic limitation of movement of neck 10/26/2021  ? Anxiety, generalized 06/18/2020  ? Preventive measure 06/13/2020  ? Liver lesion 10/05/2019  ? Type 2 diabetes mellitus ([place]) 04/06/2019  ? 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 Vitamin B12 deficiency 03/02/2019  ? Pancytopenia, acquired ([place]) 11/11/2018  ? 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 Nausea and vomiting 10/14/2018  ? Goals of care, counseling/discussion 09/21/2018  ? Neck mass 09/12/2018  ? Cancer associated pain 09/08/2018  ? Financial difficulties 11/26/2017  ? Medically noncompliant 04/13/2017  ? Port catheter in place 04/01/2016  ? Epigastric pain 02/10/2016  ? Gastritis without bleeding 01/22/2016  ? Arrhythmia 12/25/2015  ? Infection of eyelash follicle of left eye 75/64/3329  ? Poor dentition 10/16/2015  ? 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 Other constipation 05/16/2015  ? Neck pain on right side 01/28/2015  ? 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 Acquired hypothyroidism 06/09/2014  ? Malignant neoplasm of head and neck ([place]) 06/01/2014  ? 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) 06/01/2014  ? Chronic fatigue syndrome 06/01/2014  ? 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 ? ?Stark Bray, PT ?12/14/2021, 10:04 PM ? ?[facility] ?[facility] ?[place], [place], [address] ?Phone: [phone]   Fax:  [phone] ? ?Name: Christian Simmons ?MRN: [id number] ?Date of Birth: 09/11/1986 ? ? ? ?

## 2021-12-15 NOTE — Therapy (Incomplete)
?OUTPATIENT PHYSICAL THERAPY TREATMENT NOTE ? ? ?Patient Name: Christian Simmons ?MRN: [id number] ?DOB:18-Jun-1986, 36 y.o., male ?Today's Date: 12/15/2021 ? ?PCP: Patient, No Pcp Per (Inactive) ?REFERRING PROVIDER: [name], MD ? ? ? ?Past Medical History:  ?Diagnosis Date  ? Arrhythmia 12/25/2015  ? Carcinoma ([place])   ? 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 Fatigue 06/01/2014  ? Hypothyroidism   ? Infection of eyelash follicle of left eye 2/95/1884  ? Metastasis to lung ([place]) 06/01/2014  ? 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 Neuropathy   ? Radiation 07/18/14-07/31/14  ? Left upper lobe  40 gy in 10 fractions  ? Seizures ([place])   ? 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  ? ?Past Surgical History:  ?Procedure Laterality Date  ? LUNG REMOVAL, PARTIAL  05/03/2012  ? left upper lobectomy  ? nasal biopsy    ? RADICAL NECK DISSECTION    ? VIDEO BRONCHOSCOPY N/A 06/29/2014  ? Procedure: VIDEO BRONCHOSCOPY ;  Surgeon: Melrose Nakayama, MD;  Location: [facility];  Service: Thoracic;  Laterality: N/A;  ? ?Patient Active Problem List  ? Diagnosis Date Noted  ? Anterior dislocation of right shoulder 12/01/2021  ? Chronic limitation of movement of neck 10/26/2021  ? Anxiety, generalized 06/18/2020  ? Preventive measure 06/13/2020  ? Liver lesion 10/05/2019  ? Type 2 diabetes mellitus ([place]) 04/06/2019  ? 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 Vitamin B12 deficiency 03/02/2019  ? Pancytopenia, acquired ([place]) 11/11/2018  ? 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 Nausea and vomiting 10/14/2018  ? Goals of care, counseling/discussion 09/21/2018  ? Neck mass 09/12/2018  ? Cancer associated pain 09/08/2018  ? Financial difficulties 11/26/2017  ? Medically noncompliant 04/13/2017  ? Port catheter in place 04/01/2016  ? Epigastric pain 02/10/2016  ? Gastritis without bleeding 01/22/2016  ? Arrhythmia 12/25/2015  ? Infection of eyelash follicle of left eye 16/60/6301  ? Poor  dentition 10/16/2015  ? 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 Other constipation 05/16/2015  ? Neck pain on right side 01/28/2015  ? 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 Acquired hypothyroidism 06/09/2014  ? Malignant neoplasm of head and neck ([place]) 06/01/2014  ? Malignant neoplasm metastatic to lung ([place]) 06/01/2014  ? Chronic fatigue syndrome 06/01/2014  ? 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 ? ?REFERRING DIAG: *** ? ?THERAPY DIAG:  ?No diagnosis found. ? ?PERTINENT HISTORY: Nasopharyngeal carcinoma diagnosed in 2008.  Chemotherapy completed.  Recurrence in 2010 lymph node with more chemotherapy. Removal of Rt neck lymph node showing NED.  Recurrence 2013 with Lt upper lobectomy and then Rt lung lesion found 2014 with more chemotherapy (all previous treatment in [place]) .  Palliative radiation to lungs 2015. Palliative chemotherapy 2016-2019. (In [place])  2019: local recurrence Rt SCM and lymph nodes. Continuation of chemotherapy. Keytruda every 6 weeks.  ? ?PRECAUTIONS: History of neck dissection ? ?SUBJECTIVE: *** ? ?PAIN:  ?Are you having pain? Yes: NPRS scale: ***/10 ?Pain location: *** ?Pain description: *** ?Aggravating factors: *** ?Relieving factors: *** ? ? ? ?TODAY'S TREATMENT:  ?*** ? ? ?PATIENT EDUCATION: ?Education details: *** ?Person educated: {Person educated:25204} ?Education method: {Education Method:25205} ?Education comprehension: {Education Comprehension:25206} ? ? ?HOME EXERCISE PROGRAM: ?*** ? ? ?Plan - 12/14/21 2158   ?  ?  Clinical Impression Statement Pt is in much more pain post infusion of Keytruda but  reports it is lasting longer than normal.  Pain is pinpointed to the Rt base of the skull at [facility] insertion?.  Pain then runs down the side of the neck following along incision.  Pt is still improved with STM and PT.  Hopefully Pt will have less pain to resume scapular,  shoulder, and neck strengthening.  Will make sure referral for DN is sent to our clinic and DN will be added to [place] [facility] next visit for goal update to ask for rmore visits.   ?  PT Frequency 2x / week   ?  PT Duration 6 weeks   ?  PT Treatment/Interventions Therapeutic exercise;ADLs/Self Care Home Management;Scar mobilization;Passive range of motion;Dry needling;Patient/family education;Electrical Stimulation;Moist Heat;Manual techniques;Therapeutic activities;Taping   ?  PT Next Visit Plan Cont kinesiotape,  Cont postural education and TE, neck MT - add DN to plan.  try more prone scapula strengthening   ?  Consulted and Agree with Plan of Care Patient   ? ? PT Long Term Goals - 11/04/21 0912   ? ?  ? PT LONG TERM GOAL #1  ? Title Pt will demonstrate appropriate sitting posture without need for vcs/tcs   ? Time 6   ? Period Weeks   ? Status New   ?  ? PT LONG TERM GOAL #2  ? Title Pt will improve Rt shoulder abduction to at least 90deg to improve mobility   ? Baseline 60   ? Time 6   ? Period Weeks   ? Status New   ?  ? PT LONG TERM GOAL #3  ? Title Pt will be ind with final HEP for continued strengthening of the shoulder and postural muscles   ? Time 6   ? Period Weeks   ? Status New   ? ?  ?  ? ?  ? ? ? ? ? ? ?Stark Bray, PT ?12/15/2021, 9:38 PM ? ?  ? ?

## 2021-12-16 ENCOUNTER — Ambulatory Visit: Payer: Medicaid Other | Admitting: Rehabilitation

## 2021-12-18 ENCOUNTER — Other Ambulatory Visit: Payer: Self-pay

## 2021-12-18 ENCOUNTER — Ambulatory Visit: Payer: Medicaid Other

## 2021-12-18 DIAGNOSIS — C119 Malignant neoplasm of nasopharynx, unspecified: Secondary | ICD-10-CM | POA: Diagnosis not present

## 2021-12-18 NOTE — Therapy (Signed)
[facility] ?[facility] ?[place], [place], [address] ?Phone: [phone]   Fax:  [phone] ? ?Physical Therapy Treatment ? ?Patient Details  ?Name: Christian Simmons ?MRN: [id number] ?Date of Birth: 16-Aug-1986 ?Referring Provider (PT): Dr. Alvy Bimler ? ? ?Encounter Date: 12/18/2021 ? ? PT End of Session - 12/18/21 0907   ? ? Visit Number 11   ? Number of Visits 25   ? Date for PT Re-Evaluation 01/29/22   ? Authorization Type ok 5 visits 12/02/21-12/18/21   ? Authorization - Visit Number 5   ? Authorization - Number of Visits 5   ? PT Start Time [phone]   pt arrived late  ? PT Stop Time 9528   ? PT Time Calculation (min) 54 min   ? Activity Tolerance Patient tolerated treatment well   ? Behavior During Therapy [facility] for tasks assessed/performed   ? ?  ?  ? ?  ? ? ?Past Medical History:  ?Diagnosis Date  ? Arrhythmia 12/25/2015  ? Carcinoma ([place])   ? 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 Fatigue 06/01/2014  ? Hypothyroidism   ? Infection of eyelash follicle of left eye 01/16/2439  ? Metastasis to lung ([place]) 06/01/2014  ? 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 Neuropathy   ? Radiation 07/18/14-07/31/14  ? Left upper lobe  40 gy in 10 fractions  ? Seizures ([place])   ? 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  ? ? ?Past Surgical History:  ?Procedure Laterality Date  ? LUNG REMOVAL, PARTIAL  05/03/2012  ? left upper lobectomy  ? nasal biopsy    ? RADICAL NECK DISSECTION    ? VIDEO BRONCHOSCOPY N/A 06/29/2014  ? Procedure: VIDEO BRONCHOSCOPY ;  Surgeon: Melrose Nakayama, MD;  Location: [facility];  Service: Thoracic;  Laterality: N/A;  ? ? ?There were no vitals filed for this visit. ? ? Subjective Assessment - 12/18/21 0814   ? ? Subjective I feel so much better than I did last time. They finally found out I had a bad sinus infection after 2 hospital visits and half of my face swelled up.   ? Pertinent History Nasopharyngeal carcinoma diagnosed in 2008.   Chemotherapy completed.  Recurrence in 2010 lymph node with more chemotherapy. Removal of Rt neck lymph node showing NED.  Recurrence 2013 with Lt upper lobectomy and then Rt lung lesion found 2014 with more chemotherapy (all previous treatment in [place]) .  Palliative radiation to lungs 2015. Palliative chemotherapy 2016-2019. (In [place])  2019: local recurrence Rt SCM and lymph nodes. Continuation of chemotherapy. Keytruda every 6 weeks.   ? Patient Stated Goals improve shoulder and neck movement   ? Currently in Pain? No/denies   ? ?  ?  ? ?  ? ? ? ? ? [facility] PT Assessment - 12/18/21 0001   ? ?  ? AROM  ? Right Shoulder Flexion 124 Degrees   ? Right Shoulder ABduction 88 Degrees   ? Cervical - Right Side Bend 20   ? Cervical - Left Side Bend 15   ? Cervical - Right Rotation 59   ? Cervical - Left Rotation 50   just reports tightness today  ? ?  ?  ? ?  ? ? ? ? ? ? ? ? ? ? ? ? ? ? ? ? [facility] Adult PT Treatment/Exercise - 12/18/21 0001   ? ?  ? Shoulder Exercises: Prone  ? Retraction Strengthening;Right;Weights;10 reps   ?  Retraction Weight (lbs) 3   ? External Rotation Strengthening;15 reps;Weights   towel placed at anterior shoulder  ? External Rotation Weight (lbs) 3   ? Horizontal ABduction 1 Strengthening;Left;10 reps;Weights   ? Horizontal ABduction 1 Weight (lbs) 3   ?  ? Shoulder Exercises: Sidelying  ? External Rotation Strengthening;Right;12 reps;Weights   ? External Rotation Weight (lbs) 3   ? External Rotation Limitations towel roll in axilla   ?  ? Shoulder Exercises: Standing  ? Other Standing Exercises FreeMotion Machine: er walk outs 7# x15, 10# x15 returning therapist demo and VCs for correct technique   ?  ? Shoulder Exercises: Therapy Ball  ? Other Therapy Ball Exercises 3# wrist to Rt wrist for up/down and then side/side oscillations x30 sec each with cuing for scapular engagement   ?  ? Manual Therapy  ? Soft tissue mobilization In Lt S/L to Rt medial scapula border and upper trap   ? ?  ?  ? ?   ? ? ? ? ? ? ? ? ? ? ? ? ? ? ? PT Long Term Goals - 12/18/21 1205   ? ?  ? PT LONG TERM GOAL #1  ? Title Pt will demonstrate appropriate sitting posture without need for vcs/tcs   ? Baseline This is improved and pt just requires min VCs now occassionally - 12/18/21   ? Status Partially Met   ?  ? PT LONG TERM GOAL #2  ? Title Pt will improve Rt shoulder abduction to at least 90deg to improve mobility   ? Baseline 60; 88 degrees - 12/18/21   ? Status On-going   ?  ? PT LONG TERM GOAL #3  ? Title Pt will be ind with final HEP for continued strengthening of the shoulder and postural muscles   ? Baseline Pt is independent with current HEP and is getting ready to start back at his gym, will benefit from progression of HEP and gym instruction-12/18/21   ? Status On-going   ?  ? PT LONG TERM GOAL #4  ? Title Pt will improve neck rotation to at least 60 deg bilaterally   ? Time 6   ? Period Weeks   ? Status New   ? ?  ?  ? ?  ? ? ? ? ? ? ? ? Plan - 12/18/21 0913   ? ? Clinical Impression Statement Pt comes in reporting feeling much better than when here last. He was able to perform all exercises and new ones reporting feeling challenged by htese but no increased pain. Discussed how he can start going to gym ut needs to be extremely aware of his posture and technique when perfomring UE exs. He plans to take pictures of any machines that he isn't sure how to use properly and bring to therapy so we can instruct him in proper technique. Measured his Rt shoulder and cervical A/ROM and he has shown good progress with most of these since start of care.   ? Personal Factors and Comorbidities Comorbidity 3+;Time since onset of injury/illness/exacerbation;Past/Current Experience   ? Comorbidities radiation multiple times, multiple surgeries,   ? Examination-Activity Limitations Reach Overhead;Lift   ? Examination-Participation Restrictions Yard Work;Community Activity   ? Stability/Clinical Decision Making Stable/Uncomplicated   ? Rehab  Potential Fair   ? PT Frequency 2x / week   ? PT Duration 6 weeks   ? PT Treatment/Interventions Therapeutic exercise;ADLs/Self Care Home Management;Scar mobilization;Passive range of motion;Dry needling;Patient/family education;Electrical Stimulation;Moist Heat;Manual techniques;Therapeutic  activities;Taping   ? PT Next Visit Plan Renewal done this visit. Cont kinesiotape,  Cont postural education and TE, neck MT - add DN to plan.  try more prone scapula strengthening   ? PT Home Exercise Plan Access Code: [email or web address]; [place]eeks Decompression Exs   ? Consulted and Agree with Plan of Care Patient   ? ?  ?  ? ?  ? ? ?Patient will benefit from skilled therapeutic intervention in order to improve the following deficits and impairments:  Pain, Increased fascial restricitons, Increased muscle spasms, Decreased scar mobility, Impaired UE functional use, Decreased range of motion, Decreased strength, Impaired flexibility, Decreased skin integrity ? ?Visit Diagnosis: ?Cervicalgia ? ?Aftercare following surgery for neoplasm ? ?Stiffness of right shoulder, not elsewhere classified ? ?Neck stiffness ? ?Malignant neoplasm of nasopharynx ([place]) ? ?Shoulder weakness ? ?Malignant neoplasm of head and neck ([place]) ? ? ? ? ?Problem List ?Patient Active Problem List  ? Diagnosis Date Noted  ? Anterior dislocation of right shoulder 12/01/2021  ? Chronic limitation of movement of neck 10/26/2021  ? Anxiety, generalized 06/18/2020  ? Preventive measure 06/13/2020  ? Liver lesion 10/05/2019  ? Type 2 diabetes mellitus ([place]) 04/06/2019  ? 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 Vitamin B12 deficiency 03/02/2019  ? Pancytopenia, acquired ([place]) 11/11/2018  ? 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 Nausea and vomiting 10/14/2018  ? Goals of care, counseling/discussion 09/21/2018  ? Neck mass 09/12/2018  ? Cancer associated pain 09/08/2018  ? Financial difficulties 11/26/2017  ? Medically noncompliant 04/13/2017   ? Port catheter in place 04/01/2016  ? Epigastric pain 02/10/2016  ? Gastritis without bleeding 01/22/2016  ? Arrhythmia 12/25/2015  ? Infection of eyelash follicle of left eye 16/07/9603  ? Poor dentition 01/11

## 2021-12-22 ENCOUNTER — Ambulatory Visit: Payer: Medicaid Other | Admitting: Rehabilitation

## 2021-12-22 ENCOUNTER — Other Ambulatory Visit: Payer: Self-pay

## 2021-12-22 ENCOUNTER — Encounter: Payer: Self-pay | Admitting: Rehabilitation

## 2021-12-22 DIAGNOSIS — Z483 Aftercare following surgery for neoplasm: Secondary | ICD-10-CM

## 2021-12-22 DIAGNOSIS — M542 Cervicalgia: Secondary | ICD-10-CM

## 2021-12-22 DIAGNOSIS — R29898 Other symptoms and signs involving the musculoskeletal system: Secondary | ICD-10-CM

## 2021-12-22 DIAGNOSIS — C119 Malignant neoplasm of nasopharynx, unspecified: Secondary | ICD-10-CM

## 2021-12-22 DIAGNOSIS — M436 Torticollis: Secondary | ICD-10-CM

## 2021-12-22 DIAGNOSIS — M25611 Stiffness of right shoulder, not elsewhere classified: Secondary | ICD-10-CM

## 2021-12-22 NOTE — Therapy (Signed)
[facility] ?[facility] ?[place], [place], [address] ?Phone: [phone]   Fax:  [phone] ? ?Physical Therapy Treatment ? ?Patient Details  ?Name: Christian Simmons ?MRN: [id number] ?Date of Birth: [birth year] ?Referring Provider (PT): Dr. Alvy Bimler ? ? ?Encounter Date: 12/22/2021 ? ? PT End of Session - 12/22/21 0908   ? ? Visit Number 12   ? Number of Visits 25   ? Date for PT Re-Evaluation 01/29/22   ? Authorization Type update as new Josem Kaufmann is approved   ? PT Start Time 0800   ? PT Stop Time 0855   ? PT Time Calculation (min) 55 min   ? Activity Tolerance Patient tolerated treatment well   ? Behavior During Therapy [facility] for tasks assessed/performed   ? ?  ?  ? ?  ? ? ?Past Medical History:  ?Diagnosis Date  ? Arrhythmia 12/25/2015  ? Carcinoma ([place])   ? 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 Fatigue 06/01/2014  ? Hypothyroidism   ? Infection of eyelash follicle of left eye 4/94/4967  ? Metastasis to lung ([place]) 06/01/2014  ? 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 Neuropathy   ? Radiation 07/18/14-07/31/14  ? Left upper lobe  40 gy in 10 fractions  ? Seizures ([place])   ? 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  ? ? ?Past Surgical History:  ?Procedure Laterality Date  ? LUNG REMOVAL, PARTIAL  05/03/2012  ? left upper lobectomy  ? nasal biopsy    ? RADICAL NECK DISSECTION    ? VIDEO BRONCHOSCOPY N/A 06/29/2014  ? Procedure: VIDEO BRONCHOSCOPY ;  Surgeon: Melrose Nakayama, MD;  Location: [facility];  Service: Thoracic;  Laterality: N/A;  ? ? ?There were no vitals filed for this visit. ? ? Subjective Assessment - 12/22/21 0805   ? ? Subjective Still doing well.   ? Pertinent History Nasopharyngeal carcinoma diagnosed in 2008.  Chemotherapy completed.  Recurrence in 2010 lymph node with more chemotherapy. Removal of Rt neck lymph node showing NED.  Recurrence 2013 with Lt upper lobectomy and then Rt lung lesion found 2014 with more chemotherapy (all  previous treatment in [place]) .  Palliative radiation to lungs 2015. Palliative chemotherapy 2016-2019. (In [place])  2019: local recurrence Rt SCM and lymph nodes. Continuation of chemotherapy. Keytruda every 6 weeks.   ? Currently in Pain? Yes   ? Pain Score 4    ? Pain Location Neck   ? Pain Orientation Right   ? Pain Descriptors / Indicators Aching;Pressure   ? Pain Type Acute pain   ? Pain Onset More than a month ago   ? Pain Frequency Constant   ? ?  ?  ? ?  ? ? ? ? ? ? ? ? ? ? ? ? ? ? ? ? ? ? ? ? [facility] Adult PT Treatment/Exercise - 12/22/21 0001   ? ?  ? Shoulder Exercises: Supine  ? Protraction Both;20 reps   ? Protraction Weight (lbs) 5   ? Other Supine Exercises chest press 5# 2x10   ?  ? Shoulder Exercises: Prone  ? Extension Both;10 reps   ? Extension Weight (lbs) 3   ? Extension Limitations over large ball   ? Horizontal ABduction 1 Both;10 reps   ? Horizontal ABduction 1 Weight (lbs) 3   ? Horizontal ABduction 1 Limitations over large ball   ?  ? Shoulder Exercises: Sidelying  ? External Rotation Strengthening;Weights;10 reps  2 sets  ? External Rotation Weight (lbs) 3   ? External Rotation Limitations towel roll in axilla   ? ABduction 10 reps   ? ABduction Weight (lbs) 3   ?  ? Shoulder Exercises: Standing  ? Other Standing Exercises FreeMotion Machine: er walk outs 10# x15 with towel roll for positioning   ? Other Standing Exercises forearms on foam roll on wall pro/ret x 10 and rolling up and down x 10   ?  ? Manual Therapy  ? Soft tissue mobilization in supine: Rt cervical, parapsinal, suboccipitals, UT avoiding port and line   ? ?  ?  ? ?  ? ? ? ? ? ? ? ? ? ? ? ? ? ? ? PT Long Term Goals - 12/18/21 1205   ? ?  ? PT LONG TERM GOAL #1  ? Title Pt will demonstrate appropriate sitting posture without need for vcs/tcs   ? Baseline This is improved and pt just requires min VCs now occassionally - 12/18/21   ? Status Partially Met   ?  ? PT LONG TERM GOAL #2  ? Title Pt will improve Rt shoulder abduction to  at least 90deg to improve mobility   ? Baseline 60; 88 degrees - 12/18/21   ? Status On-going   ?  ? PT LONG TERM GOAL #3  ? Title Pt will be ind with final HEP for continued strengthening of the shoulder and postural muscles   ? Baseline Pt is independent with current HEP and is getting ready to start back at his gym, will benefit from progression of HEP and gym instruction-12/18/21   ? Status On-going   ?  ? PT LONG TERM GOAL #4  ? Title Pt will improve neck rotation to at least 60 deg bilaterally   ? Time 6   ? Period Weeks   ? Status New   ? ?  ?  ? ?  ? ? ? ? ? ? ? ? Plan - 12/22/21 0906   ? ? Clinical Impression Statement Pt is tolerating all gym activities well with quick fatigue but good performance and no pain.  Pt will have DN next visit to attempt to help with chronic neck and shoulder pain.  Precautions include radiation fibrosis Rt neck, port line in Rt neck.   ? PT Frequency 2x / week   ? PT Duration 6 weeks   ? PT Treatment/Interventions Therapeutic exercise;ADLs/Self Care Home Management;Scar mobilization;Passive range of motion;Dry needling;Patient/family education;Electrical Stimulation;Moist Heat;Manual techniques;Therapeutic activities;Taping   ? PT Next Visit Plan DN for chronic neck pain and shoulder pain Rt;  Cont kinesiotape,  Cont postural education and TE, neck MT   ? Consulted and Agree with Plan of Care Patient   ? ?  ?  ? ?  ? ? ?Patient will benefit from skilled therapeutic intervention in order to improve the following deficits and impairments:    ? ?Visit Diagnosis: ?Cervicalgia ? ?Stiffness of right shoulder, not elsewhere classified ? ?Aftercare following surgery for neoplasm ? ?Neck stiffness ? ?Malignant neoplasm of nasopharynx ([place]) ? ?Shoulder weakness ? ? ? ? ?Problem List ?Patient Active Problem List  ? Diagnosis Date Noted  ? Anterior dislocation of right shoulder 12/01/2021  ? Chronic limitation of movement of neck 10/26/2021  ? Anxiety, generalized 06/18/2020  ? Preventive  measure 06/13/2020  ? Liver lesion 10/05/2019  ? Type 2 diabetes mellitus ([place]) 04/06/2019  ? 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 Vitamin B12 deficiency 03/02/2019  ? Pancytopenia, acquired ([place]) 11/11/2018  ? 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 Nausea and vomiting 10/14/2018  ? Goals of care, counseling/discussion 09/21/2018  ? Neck mass 09/12/2018  ? Cancer associated pain 09/08/2018  ? Financial difficulties 11/26/2017  ? Medically noncompliant 04/13/2017  ? Port catheter in place 04/01/2016  ? Epigastric pain 02/10/2016  ? Gastritis without bleeding 01/22/2016  ? Arrhythmia 12/25/2015  ? Infection of eyelash follicle of left eye 16/07/9603  ? Poor dentition 10/16/2015  ? 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 Other constipation 05/16/2015  ? Neck pain on right side 01/28/2015  ? 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 Acquired hypothyroidism 06/09/2014  ? Malignant neoplasm of head and neck ([place]) 06/01/2014  ? Malignant neoplasm metastatic to lung ([place]) 06/01/2014  ? Chronic fatigue syndrome 06/01/2014  ? 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 ? ?Stark Bray, PT ?12/22/2021, 9:09 AM ? ?[facility] ?[facility] ?[place], [place], [address] ?Phone: [phone]   Fax:  [phone] ? ?Name: Christian Simmons ?MRN: [id number] ?Date of Birth: 1986/03/01 ? ? ? ?

## 2021-12-24 ENCOUNTER — Ambulatory Visit: Payer: Medicaid Other

## 2021-12-24 ENCOUNTER — Other Ambulatory Visit: Payer: Self-pay

## 2021-12-24 DIAGNOSIS — M25611 Stiffness of right shoulder, not elsewhere classified: Secondary | ICD-10-CM

## 2021-12-24 DIAGNOSIS — M542 Cervicalgia: Secondary | ICD-10-CM

## 2021-12-24 DIAGNOSIS — C119 Malignant neoplasm of nasopharynx, unspecified: Secondary | ICD-10-CM | POA: Diagnosis not present

## 2021-12-24 NOTE — Therapy (Signed)
[facility] ?[facility] ?[place], [place], [address] ?Phone: [phone]   Fax:  [phone] ? ?Physical Therapy Treatment ? ?Patient Details  ?Name: Christian Simmons ?MRN: [id number] ?Date of Birth: 10/18/1985 ?Referring Provider (PT): Dr. Alvy Bimler ? ? ?Encounter Date: 12/24/2021 ? ? PT End of Session - 12/24/21 0929   ? ? Visit Number 13   ? Date for PT Re-Evaluation 01/29/22   ? PT Start Time [phone]   ? PT Stop Time 0925   ? PT Time Calculation (min) 34 min   ? Activity Tolerance Patient tolerated treatment well   ? Behavior During Therapy [facility] for tasks assessed/performed   ? ?  ?  ? ?  ? ? ?Past Medical History:  ?Diagnosis Date  ? Arrhythmia 12/25/2015  ? Carcinoma ([place])   ? 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 Fatigue 06/01/2014  ? Hypothyroidism   ? Infection of eyelash follicle of left eye 2/59/5638  ? Metastasis to lung ([place]) 06/01/2014  ? 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 Neuropathy   ? Radiation 07/18/14-07/31/14  ? Left upper lobe  40 gy in 10 fractions  ? Seizures ([place])   ? 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  ? ? ?Past Surgical History:  ?Procedure Laterality Date  ? LUNG REMOVAL, PARTIAL  05/03/2012  ? left upper lobectomy  ? nasal biopsy    ? RADICAL NECK DISSECTION    ? VIDEO BRONCHOSCOPY N/A 06/29/2014  ? Procedure: VIDEO BRONCHOSCOPY ;  Surgeon: Melrose Nakayama, MD;  Location: [facility] [place];  Service: Thoracic;  Laterality: N/A;  ? ? ?There were no vitals filed for this visit. ? ? Subjective Assessment - 12/24/21 0852   ? ? Subjective I am having Rt shoulder and neck pain   ? Currently in Pain? Yes   ? Pain Score 3    ? Pain Location Neck   ? Pain Orientation Right   ? Pain Descriptors / Indicators Aching;Tightness   ? Pain Type Acute pain;Chronic pain   ? Pain Onset More than a month ago   ? Pain Frequency Intermittent   ? Aggravating Factors  when I don't sleep well,  morning hours   ? Pain Relieving Factors pain  meds, as the day progresses   ? ?  ?  ? ?  ? ? ? ? ? ? ? ? ? ? ? ? ? ? ? ? ? ? ? ? [facility] Adult PT Treatment/Exercise - 12/24/21 0001   ? ?  ? Neck Exercises: Supine  ? Other Supine Exercise cervical sidebending, levator and shoulder flexion stretch - added to HEP   ?  ? Manual Therapy  ? Manual Therapy Myofascial release;Soft tissue mobilization   ? Manual therapy comments skilled palpation and monitoring with DN treatment today   ? ?  ?  ? ?  ? ? ? Trigger Point Dry Needling - 12/24/21 0001   ? ? Consent Given? Yes   ? Education Handout Provided Yes   ? Muscles Treated Head and Neck Upper trapezius;Cervical multifidi   ? Muscles Treated Upper Quadrant Teres major;Rhomboids   ? Upper Trapezius Response Twitch reponse elicited;Palpable increased muscle length   ? Cervical multifidi Response Twitch reponse elicited;Palpable increased muscle length   ? Rhomboids Response Twitch response elicited;Palpable increased muscle length   ? Teres major Response Twitch response elicited;Palpable increased muscle length   ? ?  ?  ? ?  ? ? ? ? ? ? ? ?  PT Education - 12/24/21 0926   ? ? Education Details Access Code: [email or web address], DN info   ? Person(s) Educated Patient   ? Methods Explanation;Demonstration;Handout   ? Comprehension Verbalized understanding;Returned demonstration   ? ?  ?  ? ?  ? ? ? ? ? ? PT Long Term Goals - 12/18/21 1205   ? ?  ? PT LONG TERM GOAL #1  ? Title Pt will demonstrate appropriate sitting posture without need for vcs/tcs   ? Baseline This is improved and pt just requires min VCs now occassionally - 12/18/21   ? Status Partially Met   ?  ? PT LONG TERM GOAL #2  ? Title Pt will improve Rt shoulder abduction to at least 90deg to improve mobility   ? Baseline 60; 88 degrees - 12/18/21   ? Status On-going   ?  ? PT LONG TERM GOAL #3  ? Title Pt will be ind with final HEP for continued strengthening of the shoulder and postural muscles   ? Baseline Pt is independent with current HEP and is getting ready to start  back at his gym, will benefit from progression of HEP and gym instruction-12/18/21   ? Status On-going   ?  ? PT LONG TERM GOAL #4  ? Title Pt will improve neck rotation to at least 60 deg bilaterally   ? Time 6   ? Period Weeks   ? Status New   ? ?  ?  ? ?  ? ? ? ? ? ? ? ? Plan - 12/24/21 0928   ? ? Clinical Impression Statement Pt with first time DN treatement today.  Session focused on DN to cervical multifidi, suboccipitals and Lt periscapular and posterior shoulder musculature on the Rt.  Pt had good repsonse with twitch and improved mobility after.  PT added levator, upper trap and shoulder stretch to HEP.   ? PT Frequency 2x / week   ? PT Duration 6 weeks   ? PT Treatment/Interventions Therapeutic exercise;ADLs/Self Care Home Management;Scar mobilization;Passive range of motion;Dry needling;Patient/family education;Electrical Stimulation;Moist Heat;Manual techniques;Therapeutic activities;Taping   ? PT Next Visit Plan DN for chronic neck pain and shoulder pain Rt- next week;  Cont kinesiotape,  Cont postural education and TE, neck MT   ? PT Home Exercise Plan Access Code: [email or web address]; Meeks Decompression Exs   ? Consulted and Agree with Plan of Care Patient   ? ?  ?  ? ?  ? ? ?Patient will benefit from skilled therapeutic intervention in order to improve the following deficits and impairments:  Pain, Increased fascial restricitons, Increased muscle spasms, Decreased scar mobility, Impaired UE functional use, Decreased range of motion, Decreased strength, Impaired flexibility, Decreased skin integrity ? ?Visit Diagnosis: ?Cervicalgia ? ?Stiffness of right shoulder, not elsewhere classified ? ? ? ? ?Problem List ?Patient Active Problem List  ? Diagnosis Date Noted  ? Anterior dislocation of right shoulder 12/01/2021  ? Chronic limitation of movement of neck 10/26/2021  ? Anxiety, generalized 06/18/2020  ? Preventive measure 06/13/2020  ? Liver lesion 10/05/2019  ? Type 2 diabetes mellitus ([place]) 04/06/2019  ?  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 Vitamin B12 deficiency 03/02/2019  ? Pancytopenia, acquired ([place]) 11/11/2018  ? 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 Nausea and vomiting 10/14/2018  ? Goals of care, counseling/discussion 09/21/2018  ? Neck mass 09/12/2018  ? Cancer associated pain 09/08/2018  ? Financial difficulties 11/26/2017  ? Medically noncompliant 04/13/2017  ?  Port catheter in place 04/01/2016  ? Epigastric pain 02/10/2016  ? Gastritis without bleeding 01/22/2016  ? Arrhythmia 12/25/2015  ? Infection of eyelash follicle of left eye 16/38/4665  ? Poor dentition 10/16/2015  ? 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 Other constipation 05/16/2015  ? Neck pain on right side 01/28/2015  ? 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 Acquired hypothyroidism 06/09/2014  ? Malignant neoplasm of head and neck ([place]) 06/01/2014  ? Malignant neoplasm metastatic to lung ([place]) 06/01/2014  ? Chronic fatigue syndrome 06/01/2014  ? 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 ?Sigurd Sos, PT ?12/24/21 9:30 AM  ?12/24/2021, 9:30 AM ? ?[facility] ?[facility] [place] ?[place], [place], [address] ?Phone: [phone]   Fax:  [phone] ? ?Name: Christian Simmons ?MRN: [id number] ?Date of Birth: July 24, 1986 ? ? ? ?

## 2021-12-24 NOTE — Patient Instructions (Addendum)
Trigger Point Dry Needling ? ?What is Trigger Point Dry Needling (DN)? ?DN is a physical therapy technique used to treat muscle pain and dysfunction. Specifically, DN helps deactivate muscle trigger points (muscle knots).  ?A thin filiform needle is used to penetrate the skin and stimulate the underlying trigger point. The goal is for a local twitch response (LTR) to occur and for the trigger point to relax. No medication of any kind is injected during the procedure.  ? ?What Does Trigger Point Dry Needling Feel Like?  ?The procedure feels different for each individual patient. Some patients report that they do not actually feel the needle enter the skin and overall the process is not painful. Very mild bleeding may occur. However, many patients feel a deep cramping in the muscle in which the needle was inserted. This is the local twitch response.  ? ?How Will I feel after the treatment? ?Soreness is normal, and the onset of soreness may not occur for a few hours. Typically this soreness does not last longer than two days.  ?Bruising is uncommon, however; ice can be used to decrease any possible bruising.  ?In rare cases feeling tired or nauseous after the treatment is normal. In addition, your symptoms may get worse before they get better, this period will typically not last longer than 24 hours.  ? ?What Can I do After My Treatment? ?Increase your hydration by drinking more water for the next 24 hours. ?You may place ice or heat on the areas treated that have become sore, however, do not use heat on inflamed or bruised areas. Heat often brings more relief post needling. ?You can continue your regular activities, but vigorous activity is not recommended initially after the treatment for 24 hours. ?DN is best combined with other physical therapy such as strengthening, stretching, and other therapies. ? ?Christian Simmons  ?[address] ?[place] [address].  ?[phone]   ? ?Access Code:  [email or web address] ?URL: [email or web address] ?Date: 12/24/2021 ?Prepared by: Claiborne Billings ? ?Exercises ? ? ?Seated Cervical Sidebending Stretch - 3 x daily - 7 x weekly - 1 sets - 3 reps - 20 hold ?Seated Levator Scapulae Stretch - 3 x daily - 7 x weekly - 1 sets - 3 reps - 20 hold ?Standing Shoulder Flexion Wall Walk - 3 x daily - 7 x weekly - 2 sets - 10 reps - 5 hold ? ?

## 2021-12-29 ENCOUNTER — Telehealth: Payer: Self-pay

## 2021-12-29 NOTE — Telephone Encounter (Signed)
I can see him at 1 pm or he can see any eye doctor ?It's not his cancer ?

## 2021-12-29 NOTE — Telephone Encounter (Signed)
Called and left a message asking him to call the office back. 

## 2021-12-29 NOTE — Therapy (Incomplete)
?OUTPATIENT PHYSICAL THERAPY TREATMENT NOTE ? ? ?Patient Name: Christian Simmons ?MRN: [id number] ?DOB:01/20/1986, 36 y.o., male ?Today's Date: 12/29/2021 ? ?PCP: Patient, No Pcp Per (Inactive) ?REFERRING PROVIDER: [name], MD ? ? ? ?Past Medical History:  ?Diagnosis Date  ? Arrhythmia 12/25/2015  ? Carcinoma ([place])   ? 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 Fatigue 06/01/2014  ? Hypothyroidism   ? Infection of eyelash follicle of left eye 4/58/5929  ? Metastasis to lung ([place]) 06/01/2014  ? 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 Neuropathy   ? Radiation 07/18/14-07/31/14  ? Left upper lobe  40 gy in 10 fractions  ? Seizures ([place])   ? 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  ? ?Past Surgical History:  ?Procedure Laterality Date  ? LUNG REMOVAL, PARTIAL  05/03/2012  ? left upper lobectomy  ? nasal biopsy    ? RADICAL NECK DISSECTION    ? VIDEO BRONCHOSCOPY N/A 06/29/2014  ? Procedure: VIDEO BRONCHOSCOPY ;  Surgeon: Melrose Nakayama, MD;  Location: [facility];  Service: Thoracic;  Laterality: N/A;  ? ?Patient Active Problem List  ? Diagnosis Date Noted  ? Anterior dislocation of right shoulder 12/01/2021  ? Chronic limitation of movement of neck 10/26/2021  ? Anxiety, generalized 06/18/2020  ? Preventive measure 06/13/2020  ? Liver lesion 10/05/2019  ? Type 2 diabetes mellitus ([place]) 04/06/2019  ? 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 Vitamin B12 deficiency 03/02/2019  ? Pancytopenia, acquired ([place]) 11/11/2018  ? 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 Nausea and vomiting 10/14/2018  ? Goals of care, counseling/discussion 09/21/2018  ? Neck mass 09/12/2018  ? Cancer associated pain 09/08/2018  ? Financial difficulties 11/26/2017  ? Medically noncompliant 04/13/2017  ? Port catheter in place 04/01/2016  ? Epigastric pain 02/10/2016  ? Gastritis without bleeding 01/22/2016  ? Arrhythmia 12/25/2015  ? Infection of eyelash follicle of left eye 24/46/2863  ? Poor  dentition 10/16/2015  ? 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 Other constipation 05/16/2015  ? Neck pain on right side 01/28/2015  ? 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 Acquired hypothyroidism 06/09/2014  ? Malignant neoplasm of head and neck ([place]) 06/01/2014  ? Malignant neoplasm metastatic to lung ([place]) 06/01/2014  ? Chronic fatigue syndrome 06/01/2014  ? 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 ? ?REFERRING DIAG: Neck pain and stiffness ? ?THERAPY DIAG:  ?No diagnosis found. ? ?PERTINENT HISTORY: Nasopharyngeal carcinoma diagnosed in 2008.  Chemotherapy completed.  Recurrence in 2010 lymph node with more chemotherapy. Removal of Rt neck lymph node showing NED.  Recurrence 2013 with Lt upper lobectomy and then Rt lung lesion found 2014 with more chemotherapy (all previous treatment in [place]) .  Palliative radiation to lungs 2015. Palliative chemotherapy 2016-2019. (In [place])  2019: local recurrence Rt SCM and lymph nodes. Continuation of chemotherapy. Keytruda every 6 weeks.  ? ?PRECAUTIONS: cancer history with neck dissection ? ?SUBJECTIVE: *** ? ?PAIN:  ?Are you having pain? Yes: {yespain:27235::"NPRS scale: ***/10","Pain location: ***","Pain description: ***","Aggravating factors: ***","Relieving factors: ***"} ? ? ?TODAY'S TREATMENT:  ? 12/29/21 ?Supine Protraction/Retraction: 5# 2x10 ?Prone extension over large ball 3# x 10 bil ?Horizontal abduction over large ball 3# x 10 bil ?Sidelying ER 2x10 3# with towel roll in axilla Rt ?Sidelying Abduction x10 3# Rt ?Free motion ER walk outs 10# x 15 with towel roll for positioning ?Forearms  on foam roll on wall pro/ret x 10 and rollin up and down x 10 ? ?STM in supine to Rt cervical paraspinals, suboccipitals, levator scapula, UT avoiding port and line ? ?PATIENT EDUCATION: ?Education details: *** ?Person educated: {Person educated:25204} ?Education  method: {Education Method:25205} ?Education comprehension: {Education Comprehension:25206} ? ? ?HOME EXERCISE PROGRAM: ?Access Code: [email or web address] and meeks ? ? ? ? PT Long Term Goals - 12/18/21 1205   ? ?  ? PT LONG TERM GOAL #1  ? Title Pt will demonstrate appropriate sitting posture without need for vcs/tcs   ? Baseline This is improved and pt just requires min VCs now occassionally - 12/18/21   ? Status Partially Met   ?  ? PT LONG TERM GOAL #2  ? Title Pt will improve Rt shoulder abduction to at least 90deg to improve mobility   ? Baseline 60; 88 degrees - 12/18/21   ? Status On-going   ?  ? PT LONG TERM GOAL #3  ? Title Pt will be ind with final HEP for continued strengthening of the shoulder and postural muscles   ? Baseline Pt is independent with current HEP and is getting ready to start back at his gym, will benefit from progression of HEP and gym instruction-12/18/21   ? Status On-going   ?  ? PT LONG TERM GOAL #4  ? Title Pt will improve neck rotation to at least 60 deg bilaterally   ? Time 6   ? Period Weeks   ? Status New   ? ?  ?  ? ?  ? ? ? ? Clinical Impression Statement Pt with first time DN treatement today.  Session focused on DN to cervical multifidi, suboccipitals and Lt periscapular and posterior shoulder musculature on the Rt.  Pt had good repsonse with twitch and improved mobility after.  PT added levator, upper trap and shoulder stretch to HEP.   ?  PT Frequency 2x / week   ?  PT Duration 6 weeks   ?  PT Treatment/Interventions Therapeutic exercise;ADLs/Self Care Home Management;Scar mobilization;Passive range of motion;Dry needling;Patient/family education;Electrical Stimulation;Moist Heat;Manual techniques;Therapeutic activities;Taping   ?  PT Next Visit Plan DN for chronic neck pain and shoulder pain Rt- next week;  Cont kinesiotape,  Cont postural education and TE, neck MT   ?  PT Home Exercise Plan Access Code: [email or web address]; Meeks Decompression Exs   ?  Consulted and Agree with Plan of Care Patient    ? ? ? ? ?Stark Bray, PT ?12/29/2021, 8:18 PM ? ?  ? ?

## 2021-12-29 NOTE — Telephone Encounter (Signed)
Called back and given below message. He verbalized understanding. He declined the appt. He is going to take OTC allergy medication and just wait to see if his eye gets better. ?He called his PCP before calling the office and was told to contact Dr. Alvy Bimler because the eye swelling issue is his cancer. He appreciated the call back and will call the office back for concerns. ?

## 2021-12-29 NOTE — Telephone Encounter (Signed)
Returned his call. His right eye has been swollen X 2 days. No pain, redness or drainage. Denies injury. Having some upper respiratory symptoms. Vision is unchanged. ? ?He is worried that this may be related to his cancer ?

## 2021-12-29 NOTE — Therapy (Deleted)
[facility] ?[facility] ?[place], [place], [address] ?Phone: [phone]   Fax:  [phone] ? ?Physical Therapy Treatment ? ?Patient Details  ?Name: Christian Simmons ?MRN: [id number] ?Date of Birth: 1986/01/29 ?Referring Provider (PT): Dr. Alvy Bimler ? ? ?Encounter Date: 12/30/2021 ? ? ? ? ?Past Medical History:  ?Diagnosis Date  ? Arrhythmia 12/25/2015  ? Carcinoma ([place])   ? 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 Fatigue 06/01/2014  ? Hypothyroidism   ? Infection of eyelash follicle of left eye 4/56/2563  ? Metastasis to lung ([place]) 06/01/2014  ? 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 Neuropathy   ? Radiation 07/18/14-07/31/14  ? Left upper lobe  40 gy in 10 fractions  ? Seizures ([place])   ? 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  ? ? ?Past Surgical History:  ?Procedure Laterality Date  ? LUNG REMOVAL, PARTIAL  05/03/2012  ? left upper lobectomy  ? nasal biopsy    ? RADICAL NECK DISSECTION    ? VIDEO BRONCHOSCOPY N/A 06/29/2014  ? Procedure: VIDEO BRONCHOSCOPY ;  Surgeon: Melrose Nakayama, MD;  Location: [facility];  Service: Thoracic;  Laterality: N/A;  ? ? ?There were no vitals filed for this visit. ? ? ? ? ? ? ? ? ? ? ? ? ? ? ? ? ? ? ? ? ? ? ? ? ? ? ? ? ? ? ? ? ? ? ? ? ? ? ? PT Long Term Goals - 12/18/21 1205   ? ?  ? PT LONG TERM GOAL #1  ? Title Pt will demonstrate appropriate sitting posture without need for vcs/tcs   ? Baseline This is improved and pt just requires min VCs now occassionally - 12/18/21   ? Status Partially Met   ?  ? PT LONG TERM GOAL #2  ? Title Pt will improve Rt shoulder abduction to at least 90deg to improve mobility   ? Baseline 60; 88 degrees - 12/18/21   ? Status On-going   ?  ? PT LONG TERM GOAL #3  ? Title Pt will be ind with final HEP for continued strengthening of the shoulder and postural muscles   ? Baseline Pt is independent with current HEP and is getting ready to start back at his gym, will benefit from  progression of HEP and gym instruction-12/18/21   ? Status On-going   ?  ? PT LONG TERM GOAL #4  ? Title Pt will improve neck rotation to at least 60 deg bilaterally   ? Time 6   ? Period Weeks   ? Status New   ? ?  ?  ? ?  ? ? ? ? ? ? ? ? ? ? ?Patient will benefit from skilled therapeutic intervention in order to improve the following deficits and impairments:    ? ?Visit Diagnosis: ?No diagnosis found. ? ? ? ? ?Problem List ?Patient Active Problem List  ? Diagnosis Date Noted  ? Anterior dislocation of right shoulder 12/01/2021  ? Chronic limitation of movement of neck 10/26/2021  ? Anxiety, generalized 06/18/2020  ? Preventive measure 06/13/2020  ? Liver lesion 10/05/2019  ? Type 2 diabetes mellitus ([place]) 04/06/2019  ? 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 Vitamin B12 deficiency 03/02/2019  ? Pancytopenia, acquired ([place]) 11/11/2018  ? 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 Nausea and vomiting 10/14/2018  ? Goals of care, counseling/discussion 09/21/2018  ? Neck  mass 09/12/2018  ? Cancer associated pain 09/08/2018  ? Financial difficulties 11/26/2017  ? Medically noncompliant 04/13/2017  ? Port catheter in place 04/01/2016  ? Epigastric pain 02/10/2016  ? Gastritis without bleeding 01/22/2016  ? Arrhythmia 12/25/2015  ? Infection of eyelash follicle of left eye 07/46/0029  ? Poor dentition 10/16/2015  ? 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 Other constipation 05/16/2015  ? Neck pain on right side 01/28/2015  ? 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 Acquired hypothyroidism 06/09/2014  ? Malignant neoplasm of head and neck ([place]) 06/01/2014  ? Malignant neoplasm metastatic to lung ([place]) 06/01/2014  ? Chronic fatigue syndrome 06/01/2014  ? 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 ?Sigurd Sos, PT ?12/29/21 8:17 PM  ?12/29/2021, 8:17 PM ? ?[facility] ?[facility] ?[place], [place], [address] ?Phone: [phone]   Fax:  [phone] ? ?Name: Christian Simmons ?MRN: [id number] ?Date of Birth: [birth year] ? ? ? ?

## 2021-12-30 ENCOUNTER — Encounter: Payer: Self-pay | Admitting: Rehabilitation

## 2022-01-01 ENCOUNTER — Ambulatory Visit: Payer: Medicaid Other

## 2022-01-01 DIAGNOSIS — C119 Malignant neoplasm of nasopharynx, unspecified: Secondary | ICD-10-CM | POA: Diagnosis not present

## 2022-01-01 DIAGNOSIS — M542 Cervicalgia: Secondary | ICD-10-CM

## 2022-01-01 DIAGNOSIS — M25611 Stiffness of right shoulder, not elsewhere classified: Secondary | ICD-10-CM

## 2022-01-01 NOTE — Therapy (Signed)
[facility] ?[facility] ?[place], [place], [address] ?Phone: [phone]   Fax:  [phone] ? ?Physical Therapy Treatment ? ?Patient Details  ?Name: Christian Simmons ?MRN: [id number] ?Date of Birth: 1986/06/27 ?Referring Provider (PT): Dr. Alvy Bimler ? ? ?Encounter Date: 01/01/2022 ? ? PT End of Session - 01/01/22 0926   ? ? Visit Number 14   ? Number of Visits 25   ? Date for PT Re-Evaluation 01/29/22   ? Authorization Time Period 20 visits 2/28-5/2   ? Authorization - Visit Number 8   ? Authorization - Number of Visits 20   ? PT Start Time 0848   ? PT Stop Time 6433   ? PT Time Calculation (min) 39 min   ? Activity Tolerance Patient tolerated treatment well   ? Behavior During Therapy [facility] for tasks assessed/performed   ? ?  ?  ? ?  ? ? ?Past Medical History:  ?Diagnosis Date  ? Arrhythmia 12/25/2015  ? Carcinoma ([place])   ? 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 Fatigue 06/01/2014  ? Hypothyroidism   ? Infection of eyelash follicle of left eye 2/95/1884  ? Metastasis to lung ([place]) 06/01/2014  ? 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 Neuropathy   ? Radiation 07/18/14-07/31/14  ? Left upper lobe  40 gy in 10 fractions  ? Seizures ([place])   ? 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  ? ? ?Past Surgical History:  ?Procedure Laterality Date  ? LUNG REMOVAL, PARTIAL  05/03/2012  ? left upper lobectomy  ? nasal biopsy    ? RADICAL NECK DISSECTION    ? VIDEO BRONCHOSCOPY N/A 06/29/2014  ? Procedure: VIDEO BRONCHOSCOPY ;  Surgeon: Melrose Nakayama, MD;  Location: [facility];  Service: Thoracic;  Laterality: N/A;  ? ? ?There were no vitals filed for this visit. ? ? Subjective Assessment - 01/01/22 0855   ? ? Subjective I am feeling better.  DN really helped me and I could move my shoulder more easily   ? Pertinent History Nasopharyngeal carcinoma diagnosed in 2008.  Chemotherapy completed.  Recurrence in 2010 lymph node with more chemotherapy. Removal of Rt  neck lymph node showing NED.  Recurrence 2013 with Lt upper lobectomy and then Rt lung lesion found 2014 with more chemotherapy (all previous treatment in [place]) .  Palliative radiation to lungs 2015. Palliative chemotherapy 2016-2019. (In [place])  2019: local recurrence Rt SCM and lymph nodes. Continuation of chemotherapy. Keytruda every 6 weeks.   ? ?  ?  ? ?  ? ? ? ? ? ? ? ? ? ? ? ? ? ? ? ? ? ? ? ? [facility] Adult PT Treatment/Exercise - 01/01/22 0001   ? ?  ? Manual Therapy  ? Manual Therapy Myofascial release;Soft tissue mobilization   ? Manual therapy comments skilled palpation and monitoring with DN treatment today   ? Soft tissue mobilization elongation and release to neck and scapula   ? ?  ?  ? ?  ? ? ? Trigger Point Dry Needling - 01/01/22 0001   ? ? Consent Given? Yes   ? Education Handout Provided Yes   ? Muscles Treated Head and Neck Upper trapezius;Cervical multifidi   ? Muscles Treated Upper Quadrant Teres major;Rhomboids   ? Upper Trapezius Response Twitch reponse elicited;Palpable increased muscle length   ? Cervical multifidi Response Twitch reponse elicited;Palpable increased muscle length   ? Rhomboids Response Twitch response elicited;Palpable  increased muscle length   ? Teres major Response Twitch response elicited;Palpable increased muscle length   ? ?  ?  ? ?  ? ? ? ? ? ? ? ? ? ? ? ? ? PT Long Term Goals - 12/18/21 1205   ? ?  ? PT LONG TERM GOAL #1  ? Title Pt will demonstrate appropriate sitting posture without need for vcs/tcs   ? Baseline This is improved and pt just requires min VCs now occassionally - 12/18/21   ? Status Partially Met   ?  ? PT LONG TERM GOAL #2  ? Title Pt will improve Rt shoulder abduction to at least 90deg to improve mobility   ? Baseline 60; 88 degrees - 12/18/21   ? Status On-going   ?  ? PT LONG TERM GOAL #3  ? Title Pt will be ind with final HEP for continued strengthening of the shoulder and postural muscles   ? Baseline Pt is independent with current HEP and is  getting ready to start back at his gym, will benefit from progression of HEP and gym instruction-12/18/21   ? Status On-going   ?  ? PT LONG TERM GOAL #4  ? Title Pt will improve neck rotation to at least 60 deg bilaterally   ? Time 6   ? Period Weeks   ? Status New   ? ?  ?  ? ?  ? ? ? ? ? ? ? ? Plan - 01/01/22 0928   ? ? Clinical Impression Statement Pt responded well to DN last session and reports increased ease of movement of his Rt shoulder for almost a week after treatment.  He continues to do his exercises for flexibility and strength.  Pt with good repsonse to DN today with twitch response and improved tissue mobility after session today.  PT educated pt on massage to his neck and surrounding area to improve mobility.  Pt will benefit from skilled PT to address tissue mobility, neck and shoulder flexibility to improve function.   ? PT Frequency 2x / week   ? PT Duration 6 weeks   ? PT Treatment/Interventions Therapeutic exercise;ADLs/Self Care Home Management;Scar mobilization;Passive range of motion;Dry needling;Patient/family education;Electrical Stimulation;Moist Heat;Manual techniques;Therapeutic activities;Taping   ? PT Next Visit Plan DN for chronic neck pain and shoulder pain Rt- next week;  Cont kinesiotape,  Cont postural education and TE, neck MT   ? PT Home Exercise Plan Access Code: [email or web address]; Meeks Decompression Exs   ? Consulted and Agree with Plan of Care Patient   ? ?  ?  ? ?  ? ? ?Patient will benefit from skilled therapeutic intervention in order to improve the following deficits and impairments:  Pain, Increased fascial restricitons, Increased muscle spasms, Decreased scar mobility, Impaired UE functional use, Decreased range of motion, Decreased strength, Impaired flexibility, Decreased skin integrity ? ?Visit Diagnosis: ?Cervicalgia ? ?Stiffness of right shoulder, not elsewhere classified ? ? ? ? ?Problem List ?Patient Active Problem List  ? Diagnosis Date Noted  ? Anterior dislocation of  right shoulder 12/01/2021  ? Chronic limitation of movement of neck 10/26/2021  ? Anxiety, generalized 06/18/2020  ? Preventive measure 06/13/2020  ? Liver lesion 10/05/2019  ? Type 2 diabetes mellitus ([place]) 04/06/2019  ? 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 Vitamin B12 deficiency 03/02/2019  ? Pancytopenia, acquired ([place]) 11/11/2018  ? 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 Nausea and vomiting 10/14/2018  ? Goals of  care, counseling/discussion 09/21/2018  ? Neck mass 09/12/2018  ? Cancer associated pain 09/08/2018  ? Financial difficulties 11/26/2017  ? Medically noncompliant 04/13/2017  ? Port catheter in place 04/01/2016  ? Epigastric pain 02/10/2016  ? Gastritis without bleeding 01/22/2016  ? Arrhythmia 12/25/2015  ? Infection of eyelash follicle of left eye 85/63/1497  ? Poor dentition 10/16/2015  ? 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 Other constipation 05/16/2015  ? Neck pain on right side 01/28/2015  ? 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 Acquired hypothyroidism 06/09/2014  ? Malignant neoplasm of head and neck ([place]) 06/01/2014  ? Malignant neoplasm metastatic to lung ([place]) 06/01/2014  ? Chronic fatigue syndrome 06/01/2014  ? 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 ?Sigurd Sos, PT ?01/01/22 9:31 AM  ?[facility] ?[facility] ?[place], [place], [address] ?Phone: [phone]   Fax:  [phone] ? ?Name: Christian Simmons ?MRN: [id number] ?Date of Birth: April 22, 1986 ? ? ? ?

## 2022-01-07 ENCOUNTER — Ambulatory Visit: Payer: Medicaid Other | Attending: Hematology and Oncology

## 2022-01-07 ENCOUNTER — Other Ambulatory Visit (HOSPITAL_BASED_OUTPATIENT_CLINIC_OR_DEPARTMENT_OTHER): Payer: Self-pay

## 2022-01-07 ENCOUNTER — Other Ambulatory Visit: Payer: Self-pay | Admitting: Hematology and Oncology

## 2022-01-07 ENCOUNTER — Telehealth: Payer: Self-pay

## 2022-01-07 DIAGNOSIS — C760 Malignant neoplasm of head, face and neck: Secondary | ICD-10-CM | POA: Diagnosis present

## 2022-01-07 DIAGNOSIS — C119 Malignant neoplasm of nasopharynx, unspecified: Secondary | ICD-10-CM | POA: Diagnosis present

## 2022-01-07 DIAGNOSIS — M25611 Stiffness of right shoulder, not elsewhere classified: Secondary | ICD-10-CM | POA: Diagnosis present

## 2022-01-07 DIAGNOSIS — M542 Cervicalgia: Secondary | ICD-10-CM | POA: Insufficient documentation

## 2022-01-07 DIAGNOSIS — M436 Torticollis: Secondary | ICD-10-CM | POA: Insufficient documentation

## 2022-01-07 DIAGNOSIS — R29898 Other symptoms and signs involving the musculoskeletal system: Secondary | ICD-10-CM | POA: Diagnosis present

## 2022-01-07 DIAGNOSIS — Z483 Aftercare following surgery for neoplasm: Secondary | ICD-10-CM | POA: Diagnosis present

## 2022-01-07 MED ORDER — OXYCODONE HCL 5 MG PO TABS
5.0000 mg | ORAL_TABLET | Freq: Three times a day (TID) | ORAL | 0 refills | Status: DC | PRN
  Filled 2022-01-07: qty 90, 30d supply, fill #0

## 2022-01-07 NOTE — Telephone Encounter (Signed)
Pt called and requests Oxycodone 5mg  refill and prefers it being sent to [facility]. Advised pt I would make MD aware of request. He verbalized thanks and understanding.  ?

## 2022-01-07 NOTE — Telephone Encounter (Signed)
[facility]

## 2022-01-07 NOTE — Therapy (Signed)
[facility] ?[facility] ?[place], [place], [address] ?Phone: [phone]   Fax:  [phone] ? ?Physical Therapy Treatment ? ?Patient Details  ?Name: Christian Simmons ?MRN: [id number] ?Date of Birth: 05-20-1986 ?Referring Provider (PT): Dr. Alvy Bimler ? ? ?Encounter Date: 01/07/2022 ? ? PT End of Session - 01/07/22 8185   ? ? Visit Number 15   ? Number of Visits 25   ? Date for PT Re-Evaluation 01/29/22   ? Authorization - Visit Number 9   ? Authorization - Number of Visits 20   ? PT Start Time [phone]   pt arrived late  ? PT Stop Time 1005   ? PT Time Calculation (min) 53 min   ? Activity Tolerance Patient tolerated treatment well   ? Behavior During Therapy [facility] for tasks assessed/performed   ? ?  ?  ? ?  ? ? ?Past Medical History:  ?Diagnosis Date  ? Arrhythmia 12/25/2015  ? Carcinoma ([place])   ? 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 Fatigue 06/01/2014  ? Hypothyroidism   ? Infection of eyelash follicle of left eye 9/70/2637  ? Metastasis to lung () 06/01/2014  ? 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 Neuropathy   ? Radiation 07/18/14-07/31/14  ? Left upper lobe  40 gy in 10 fractions  ? Seizures ([place])   ? 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  ? ? ?Past Surgical History:  ?Procedure Laterality Date  ? LUNG REMOVAL, PARTIAL  05/03/2012  ? left upper lobectomy  ? nasal biopsy    ? RADICAL NECK DISSECTION    ? VIDEO BRONCHOSCOPY N/A 06/29/2014  ? Procedure: VIDEO BRONCHOSCOPY ;  Surgeon: Melrose Nakayama, MD;  Location: [facility];  Service: Thoracic;  Laterality: N/A;  ? ? ?There were no vitals filed for this visit. ? ? Subjective Assessment - 01/07/22 0926   ? ? Subjective The DN has really helped decrease my pain and increase my Rt shoulder ROM. I am still dealing with the compensations though when I raise my arm.   ? Pertinent History Nasopharyngeal carcinoma diagnosed in 2008.  Chemotherapy completed.  Recurrence in 2010 lymph node with more  chemotherapy. Removal of Rt neck lymph node showing NED.  Recurrence 2013 with Lt upper lobectomy and then Rt lung lesion found 2014 with more chemotherapy (all previous treatment in [place]) .  Palliative radiation to lungs 2015. Palliative chemotherapy 2016-2019. (In [place])  2019: local recurrence Rt SCM and lymph nodes. Continuation of chemotherapy. Keytruda every 6 weeks.   ? Patient Stated Goals improve shoulder and neck movement   ? ?  ?  ? ?  ? ? ? ? ? ? ? ? ? ? ? ? ? ? ? ? ? ? ? ? [facility] Adult PT Treatment/Exercise - 01/07/22 0001   ? ?  ? Exercises  ? Other Exercises  FreeMotion machine: 10# bil UE lat pull downs 2x10, then bil UE tricep extensions 3x10 returning therapist demo for each   ?  ? Shoulder Exercises: Pulleys  ? Flexion 3 minutes   ? Flexion Limitations [place] cues to decrease Rt scapular compensation   ? Scaption 2 minutes   ? Scaption Limitations VCs to keep UE's in ROM where compensations were limited   ?  ? Shoulder Exercises: Therapy Ball  ? Flexion Both;10 reps   Roll yellow ball up wall, 2# on Rt wrist x5, then 4# x5  ? Flexion Limitations Pt returned  therapist demo and VCs to [place] scapular compensation   ?  ? Manual Therapy  ? Manual Therapy Myofascial release;Soft tissue mobilization   ? Soft tissue mobilization in supine with cocoa butter: Rt cervical, parapsinal, suboccipitals, UT avoiding port and line   ? ?  ?  ? ?  ? ? ? ? ? ? ? ? ? ? ? ? ? ? ? PT Long Term Goals - 12/18/21 1205   ? ?  ? PT LONG TERM GOAL #1  ? Title Pt will demonstrate appropriate sitting posture without need for vcs/tcs   ? Baseline This is improved and pt just requires min VCs now occassionally - 12/18/21   ? Status Partially Met   ?  ? PT LONG TERM GOAL #2  ? Title Pt will improve Rt shoulder abduction to at least 90deg to improve mobility   ? Baseline 60; 88 degrees - 12/18/21   ? Status On-going   ?  ? PT LONG TERM GOAL #3  ? Title Pt will be ind with final HEP for continued strengthening of the shoulder and  postural muscles   ? Baseline Pt is independent with current HEP and is getting ready to start back at his gym, will benefit from progression of HEP and gym instruction-12/18/21   ? Status On-going   ?  ? PT LONG TERM GOAL #4  ? Title Pt will improve neck rotation to at least 60 deg bilaterally   ? Time 6   ? Period Weeks   ? Status New   ? ?  ?  ? ?  ? ? ? ? ? ? ? ? Plan - 01/07/22 1010   ? ? Clinical Impression Statement Pt reports great benefit from DN thus far with reduction in pain noted and improved Rt shoulder A/ROM. Focused today on Rt shoulder and postural strength while providing cuing to decrease Rt scapular compensation throughout motions. Though pt does still struggle with this at end motions, it has improved since start of care. ALso continued with STM working to decrease palpable trigger points in posterior Rt upper quadrant and cervical area.   ? Personal Factors and Comorbidities Comorbidity 3+;Time since onset of injury/illness/exacerbation;Past/Current Experience   ? Comorbidities radiation multiple times, multiple surgeries,   ? Examination-Activity Limitations Reach Overhead;Lift   ? Examination-Participation Restrictions Yard Work;Community Activity   ? Stability/Clinical Decision Making Stable/Uncomplicated   ? Rehab Potential Fair   ? PT Frequency 2x / week   ? PT Duration 6 weeks   ? PT Treatment/Interventions Therapeutic exercise;ADLs/Self Care Home Management;Scar mobilization;Passive range of motion;Dry needling;Patient/family education;Electrical Stimulation;Moist Heat;Manual techniques;Therapeutic activities;Taping   ? PT Next Visit Plan DN for chronic neck pain and shoulder pain Rt- next week;  Cont kinesiotape,  Cont postural education and TE, neck MT   ? PT Home Exercise Plan Access Code: [email or web address]; Meeks Decompression Exs   ? Consulted and Agree with Plan of Care Patient   ? ?  ?  ? ?  ? ? ?Patient will benefit from skilled therapeutic intervention in order to improve the following  deficits and impairments:  Pain, Increased fascial restricitons, Increased muscle spasms, Decreased scar mobility, Impaired UE functional use, Decreased range of motion, Decreased strength, Impaired flexibility, Decreased skin integrity ? ?Visit Diagnosis: ?Cervicalgia ? ?Stiffness of right shoulder, not elsewhere classified ? ?Aftercare following surgery for neoplasm ? ?Neck stiffness ? ?Malignant neoplasm of nasopharynx ([place]) ? ?Shoulder weakness ? ?Malignant neoplasm of head and neck ([place]) ? ? ? ? ?  Problem List ?Patient Active Problem List  ? Diagnosis Date Noted  ? Anterior dislocation of right shoulder 12/01/2021  ? Chronic limitation of movement of neck 10/26/2021  ? Anxiety, generalized 06/18/2020  ? Preventive measure 06/13/2020  ? Liver lesion 10/05/2019  ? Type 2 diabetes mellitus ([place]) 04/06/2019  ? 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 Vitamin B12 deficiency 03/02/2019  ? Pancytopenia, acquired ([place]) 11/11/2018  ? 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 Nausea and vomiting 10/14/2018  ? Goals of care, counseling/discussion 09/21/2018  ? Neck mass 09/12/2018  ? Cancer associated pain 09/08/2018  ? Financial difficulties 11/26/2017  ? Medically noncompliant 04/13/2017  ? Port catheter in place 04/01/2016  ? Epigastric pain 02/10/2016  ? Gastritis without bleeding 01/22/2016  ? Arrhythmia 12/25/2015  ? Infection of eyelash follicle of left eye 03/52/4818  ? Poor dentition 10/16/2015  ? 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 Other constipation 05/16/2015  ? Neck pain on right side 01/28/2015  ? 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 Acquired hypothyroidism 06/09/2014  ? Malignant neoplasm of head and neck ([place]) 06/01/2014  ? Malignant neoplasm metastatic to lung ([place]) 06/01/2014  ? Chronic fatigue syndrome 06/01/2014  ? Malignant neoplasm of nasopharynx ([place])  06/01/2014  ? ? ?Otelia Limes, PTA ?01/07/2022, 10:12 AM ? ?[facility] ?[facility] ?[place], [place], [address] ?Phone: [phone]   Fax:  [phone]

## 2022-01-09 ENCOUNTER — Ambulatory Visit: Payer: Medicaid Other | Admitting: Physical Therapy

## 2022-01-09 ENCOUNTER — Telehealth: Payer: Self-pay | Admitting: Physical Therapy

## 2022-01-09 NOTE — Telephone Encounter (Signed)
Called regarding no-show for appt this morning.  Unable to leave message voicemail box is full ?

## 2022-01-16 ENCOUNTER — Encounter: Payer: Self-pay | Admitting: Hematology and Oncology

## 2022-01-16 ENCOUNTER — Ambulatory Visit: Payer: Medicaid Other | Admitting: Physical Therapy

## 2022-01-16 ENCOUNTER — Inpatient Hospital Stay: Payer: Medicaid Other | Admitting: Hematology and Oncology

## 2022-01-16 ENCOUNTER — Inpatient Hospital Stay: Payer: Medicaid Other | Attending: Hematology and Oncology

## 2022-01-16 ENCOUNTER — Inpatient Hospital Stay: Payer: Medicaid Other

## 2022-01-16 ENCOUNTER — Other Ambulatory Visit: Payer: Self-pay

## 2022-01-16 DIAGNOSIS — M542 Cervicalgia: Secondary | ICD-10-CM

## 2022-01-16 DIAGNOSIS — Z79899 Other long term (current) drug therapy: Secondary | ICD-10-CM | POA: Diagnosis not present

## 2022-01-16 DIAGNOSIS — M5382 Other specified dorsopathies, cervical region: Secondary | ICD-10-CM | POA: Diagnosis not present

## 2022-01-16 DIAGNOSIS — C760 Malignant neoplasm of head, face and neck: Secondary | ICD-10-CM

## 2022-01-16 DIAGNOSIS — E119 Type 2 diabetes mellitus without complications: Secondary | ICD-10-CM | POA: Diagnosis not present

## 2022-01-16 DIAGNOSIS — G893 Neoplasm related pain (acute) (chronic): Secondary | ICD-10-CM

## 2022-01-16 DIAGNOSIS — Z7189 Other specified counseling: Secondary | ICD-10-CM

## 2022-01-16 DIAGNOSIS — Z5112 Encounter for antineoplastic immunotherapy: Secondary | ICD-10-CM | POA: Insufficient documentation

## 2022-01-16 DIAGNOSIS — Z95828 Presence of other vascular implants and grafts: Secondary | ICD-10-CM

## 2022-01-16 DIAGNOSIS — C7800 Secondary malignant neoplasm of unspecified lung: Secondary | ICD-10-CM

## 2022-01-16 DIAGNOSIS — M25611 Stiffness of right shoulder, not elsewhere classified: Secondary | ICD-10-CM

## 2022-01-16 LAB — CMP (CANCER CENTER ONLY)
ALT: 20 U/L (ref 0–44)
AST: 19 U/L (ref 15–41)
Albumin: 4.3 g/dL (ref 3.5–5.0)
Alkaline Phosphatase: 49 U/L (ref 38–126)
Anion gap: 6 (ref 5–15)
BUN: 13 mg/dL (ref 6–20)
CO2: 29 mmol/L (ref 22–32)
Calcium: 8.9 mg/dL (ref 8.9–10.3)
Chloride: 102 mmol/L (ref 98–111)
Creatinine: 0.79 mg/dL (ref 0.61–1.24)
GFR, Estimated: >60 mL/min (ref >60)
Glucose, Bld: 117 mg/dL — ABNORMAL HIGH (ref 70–99)
Potassium: 3.8 mmol/L (ref 3.5–5.1)
Sodium: 137 mmol/L (ref 135–145)
Total Bilirubin: 0.3 mg/dL (ref 0.3–1.2)
Total Protein: 7 g/dL (ref 6.5–8.1)

## 2022-01-16 LAB — CBC WITH DIFFERENTIAL (CANCER CENTER ONLY)
Abs Immature Granulocytes: 0.02 10*3/uL (ref 0.00–0.07)
Basophils Absolute: 0 10*3/uL (ref 0.0–0.1)
Basophils Relative: 1 %
Eosinophils Absolute: 0.3 10*3/uL (ref 0.0–0.5)
Eosinophils Relative: 5 %
HCT: 38.8 % — ABNORMAL LOW (ref 39.0–52.0)
Hemoglobin: 13 g/dL (ref 13.0–17.0)
Immature Granulocytes: 0 %
Lymphocytes Relative: 30 %
Lymphs Abs: 2 10*3/uL (ref 0.7–4.0)
MCH: 29.7 pg (ref 26.0–34.0)
MCHC: 33.5 g/dL (ref 30.0–36.0)
MCV: 88.8 fL (ref 80.0–100.0)
Monocytes Absolute: 0.6 10*3/uL (ref 0.1–1.0)
Monocytes Relative: 9 %
Neutro Abs: 3.7 10*3/uL (ref 1.7–7.7)
Neutrophils Relative %: 55 %
Platelet Count: 190 10*3/uL (ref 150–400)
RBC: 4.37 MIL/uL (ref 4.22–5.81)
RDW: 13.1 % (ref 11.5–15.5)
WBC Count: 6.7 10*3/uL (ref 4.0–10.5)
nRBC: 0 % (ref 0.0–0.2)

## 2022-01-16 LAB — TSH: TSH: 5.328 u[IU]/mL — ABNORMAL HIGH (ref 0.320–4.118)

## 2022-01-16 MED ORDER — SODIUM CHLORIDE 0.9 % IV SOLN: Freq: Once | INTRAVENOUS | Status: AC

## 2022-01-16 MED ORDER — SODIUM CHLORIDE 0.9 % IV SOLN
400.0000 mg | Freq: Once | INTRAVENOUS | Status: AC
  Administered 2022-01-16: 400 mg via INTRAVENOUS
  Filled 2022-01-16: qty 16

## 2022-01-16 MED ORDER — SODIUM CHLORIDE 0.9% FLUSH
10.0000 mL | INTRAVENOUS | Status: DC | PRN
  Administered 2022-01-16: 10 mL

## 2022-01-16 MED ORDER — ONDANSETRON HCL 4 MG/2ML IJ SOLN
8.0000 mg | Freq: Once | INTRAMUSCULAR | Status: AC
  Administered 2022-01-16: 8 mg via INTRAVENOUS
  Filled 2022-01-16: qty 4

## 2022-01-16 MED ORDER — SODIUM CHLORIDE 0.9% FLUSH
10.0000 mL | Freq: Once | INTRAVENOUS | Status: AC
  Administered 2022-01-16: 10 mL

## 2022-01-16 NOTE — Assessment & Plan Note (Signed)
He has more energy since he started fasting for religious reasons ?He will continue his best effort to monitor his carbohydrate intake ?We will continue his current medication ?

## 2022-01-16 NOTE — Assessment & Plan Note (Signed)
Clinically, his examination is benign ?There is no role for CT imaging at this point ?He will continue pembrolizumab every 6 weeks ?We will continue to monitor closely for toxicity ?

## 2022-01-16 NOTE — Assessment & Plan Note (Signed)
He has significant neck fibrosis and limited range of movement ?He is doing better with aggressive physical therapy and rehab ?He will continue to see therapist ?

## 2022-01-16 NOTE — Patient Instructions (Signed)
Long  Discharge Instructions: ?Thank you for choosing [facility] to provide your oncology and hematology care.  ? ?If you have a lab appointment with the [facility], please go directly to the [facility] and check in at the registration area. ?  ?Wear comfortable clothing and clothing appropriate for easy access to any Portacath or PICC line.  ? ?We strive to give you quality time with your provider. You may need to reschedule your appointment if you arrive late (15 or more minutes).  Arriving late affects you and other patients whose appointments are after yours.  Also, if you miss three or more appointments without notifying the office, you may be dismissed from the clinic at the provider?s discretion.    ?  ?For prescription refill requests, have your pharmacy contact our office and allow 72 hours for refills to be completed.   ? ?Today you received the following chemotherapy and/or immunotherapy agents: Beryle Flock    ?  ?To help prevent nausea and vomiting after your treatment, we encourage you to take your nausea medication as directed. ? ?BELOW ARE SYMPTOMS THAT SHOULD BE REPORTED IMMEDIATELY: ?*FEVER GREATER THAN 100.4 F (38 ?C) OR HIGHER ?*CHILLS OR SWEATING ?*NAUSEA AND VOMITING THAT IS NOT CONTROLLED WITH YOUR NAUSEA MEDICATION ?*UNUSUAL SHORTNESS OF BREATH ?*UNUSUAL BRUISING OR BLEEDING ?*URINARY PROBLEMS (pain or burning when urinating, or frequent urination) ?*BOWEL PROBLEMS (unusual diarrhea, constipation, pain near the anus) ?TENDERNESS IN MOUTH AND THROAT WITH OR WITHOUT PRESENCE OF ULCERS (sore throat, sores in mouth, or a toothache) ?UNUSUAL RASH, SWELLING OR PAIN  ?UNUSUAL VAGINAL DISCHARGE OR ITCHING  ? ?Items with * indicate a potential emergency and should be followed up as soon as possible or go to the Emergency Department if any problems should occur. ? ?Please show the CHEMOTHERAPY ALERT CARD or IMMUNOTHERAPY ALERT CARD at check-in to  the Emergency Department and triage nurse. ? ?Should you have questions after your visit or need to cancel or reschedule your appointment, please contact [facility]  Dept: [phone]  and follow the prompts.  Office hours are 8:00 a.m. to 4:30 p.m. Monday - Friday. Please note that voicemails left after 4:00 p.m. may not be returned until the following business day.  We are closed weekends and major holidays. You have access to a nurse at all times for urgent questions. Please call the main number to the clinic Dept: [phone] and follow the prompts. ? ? ?For any non-urgent questions, you may also contact your provider using MyChart. We now offer e-Visits for anyone 9 and older to request care online for non-urgent symptoms. For details visit [email or web address]. ?  ?Also download the MyChart app! Go to the app store, search "MyChart", open the app, select [facility], and log in with your MyChart username and password. ? ?Due to Covid, a mask is required upon entering the hospital/clinic. If you do not have a mask, one will be given to you upon arrival. For doctor visits, patients may have 1 support person aged 71 or older with them. For treatment visits, patients cannot have anyone with them due to current Covid guidelines and our immunocompromised population.  ? ?

## 2022-01-16 NOTE — Therapy (Signed)
[facility] ?[facility] [place] ?[place], [place], [address] ?Phone: [phone]   Fax:  [phone] ? ?Physical Therapy Treatment ? ?Patient Details  ?Name: Christian Simmons ?MRN: [id number] ?Date of Birth: 02/18/1986 ?Referring Provider (PT): Dr. Alvy Bimler ? ? ?Encounter Date: 01/16/2022 ? ? PT End of Session - 01/16/22 0925   ? ? Visit Number 16   ? Number of Visits 25   ? Date for PT Re-Evaluation 01/29/22   ? Authorization Type update as new Josem Kaufmann is approved   ? Authorization Time Period 20 visits 2/28-5/2   ? Authorization - Visit Number 10   ? Authorization - Number of Visits 20   ? PT Start Time [phone]   late  ? PT Stop Time [phone]   ? PT Time Calculation (min) 23 min   ? Activity Tolerance Patient tolerated treatment well   ? ?  ?  ? ?  ? ? ?Past Medical History:  ?Diagnosis Date  ? Arrhythmia 12/25/2015  ? Carcinoma ([place])   ? 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 Fatigue 06/01/2014  ? Hypothyroidism   ? Infection of eyelash follicle of left eye 1/82/9937  ? Metastasis to lung ([place]) 06/01/2014  ? 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 Neuropathy   ? Radiation 07/18/14-07/31/14  ? Left upper lobe  40 gy in 10 fractions  ? Seizures ([place])   ? 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  ? ? ?Past Surgical History:  ?Procedure Laterality Date  ? LUNG REMOVAL, PARTIAL  05/03/2012  ? left upper lobectomy  ? nasal biopsy    ? RADICAL NECK DISSECTION    ? VIDEO BRONCHOSCOPY N/A 06/29/2014  ? Procedure: VIDEO BRONCHOSCOPY ;  Surgeon: Melrose Nakayama, MD;  Location: [facility];  Service: Thoracic;  Laterality: N/A;  ? ? ?There were no vitals filed for this visit. ? ? Subjective Assessment - 01/16/22 0857   ? ? Subjective I have a cancer treatment today so I want to avoid the neck and upper trap area.  I dread the treatments.  Not up for exercise today.   I love the DN though.  Wants to work on shoulder blade area today.  ? Pertinent History Nasopharyngeal  carcinoma diagnosed in 2008.  Chemotherapy completed.  Recurrence in 2010 lymph node with more chemotherapy. Removal of Rt neck lymph node showing NED.  Recurrence 2013 with Lt upper lobectomy and then Rt lung lesion found 2014 with more chemotherapy (all previous treatment in [place]) .  Palliative radiation to lungs 2015. Palliative chemotherapy 2016-2019. (In [place])  2019: local recurrence Rt SCM and lymph nodes. Continuation of chemotherapy. Keytruda every 6 weeks.   ? Diagnostic tests recent MRI: anterior labral tear, hill sachs humerus fracture, intact rotator cuff   ? Patient Stated Goals improve shoulder and neck movement   ? Currently in Pain? No/denies   sensitive where the surgery was  ? Pain Score 0-No pain   ? ?  ?  ? ?  ? ? ? ? ? ? ? ? ? ? ? ? ? ? ? ? ? ? ? ? [facility] Adult PT Treatment/Exercise - 01/16/22 0001   ? ?  ? Manual Therapy  ? Manual Therapy Myofascial release;Soft tissue mobilization   ? Manual therapy comments skilled palpation and monitoring with DN treatment today   ? Soft tissue mobilization elongation and release to muscles adjacent to scapula   ? ?  ?  ? ?  ? ? ?  Trigger Point Dry Needling - 01/16/22 0001   ? ? Consent Given? Yes   ? Muscles Treated Head and Neck Levator scapulae   ? Muscles Treated Upper Quadrant Subscapularis   ? Levator Scapulae Response Palpable increased muscle length   ? Rhomboids Response Twitch response elicited;Palpable increased muscle length   ? Subscapularis Response Palpable increased muscle length   ? ?  ?  ? ?  ? ? ? ? ? ? ? ? ? ? ? ? ? PT Long Term Goals - 12/18/21 1205   ? ?  ? PT LONG TERM GOAL #1  ? Title Pt will demonstrate appropriate sitting posture without need for vcs/tcs   ? Baseline This is improved and pt just requires min VCs now occassionally - 12/18/21   ? Status Partially Met   ?  ? PT LONG TERM GOAL #2  ? Title Pt will improve Rt shoulder abduction to at least 90deg to improve mobility   ? Baseline 60; 88 degrees - 12/18/21   ? Status On-going    ?  ? PT LONG TERM GOAL #3  ? Title Pt will be ind with final HEP for continued strengthening of the shoulder and postural muscles   ? Baseline Pt is independent with current HEP and is getting ready to start back at his gym, will benefit from progression of HEP and gym instruction-12/18/21   ? Status On-going   ?  ? PT LONG TERM GOAL #4  ? Title Pt will improve neck rotation to at least 60 deg bilaterally   ? Time 6   ? Period Weeks   ? Status New   ? ?  ?  ? ?  ? ? ? ? ? ? ? ? Plan - 01/16/22 0922   ? ? Clinical Impression Statement The patient reports great benefit from DN but requests today to avoid cervical and upper trap regions secondary to highly sensitive there today, not sleeping well and his upcoming cancer treatment today.  He does report immediate relief with DN and manual therapy to periscapular muscles with improved soft tissue mobility and scapular mobility noted.  Therapist monitoring response throughout treatment session.   ? Personal Factors and Comorbidities Comorbidity 3+;Time since onset of injury/illness/exacerbation;Past/Current Experience   ? Comorbidities radiation multiple times, multiple surgeries,   ? Rehab Potential Fair   ? PT Frequency 2x / week   ? PT Duration 6 weeks   ? PT Treatment/Interventions Therapeutic exercise;ADLs/Self Care Home Management;Scar mobilization;Passive range of motion;Dry needling;Patient/family education;Electrical Stimulation;Moist Heat;Manual techniques;Therapeutic activities;Taping   ? PT Next Visit Plan DN for chronic neck pain and shoulder pain Rt- next week;  Cont kinesiotape,  Cont postural education and TE, neck MT;  DN as needed   ? PT Home Exercise Plan Access Code: [email or web address]; Meeks Decompression Exs   ? ?  ?  ? ?  ? ? ?Patient will benefit from skilled therapeutic intervention in order to improve the following deficits and impairments:  Pain, Increased fascial restricitons, Increased muscle spasms, Decreased scar mobility, Impaired UE functional use,  Decreased range of motion, Decreased strength, Impaired flexibility, Decreased skin integrity ? ?Visit Diagnosis: ?Cervicalgia ? ?Stiffness of right shoulder, not elsewhere classified ? ? ? ? ?Problem List ?Patient Active Problem List  ? Diagnosis Date Noted  ? Anterior dislocation of right shoulder 12/01/2021  ? Chronic limitation of movement of neck 10/26/2021  ? Anxiety, generalized 06/18/2020  ? Preventive measure 06/13/2020  ? Liver lesion 10/05/2019  ? Type  2 diabetes mellitus ([place]) 04/06/2019  ? 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 Vitamin B12 deficiency 03/02/2019  ? Pancytopenia, acquired ([place]) 11/11/2018  ? 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 Nausea and vomiting 10/14/2018  ? Goals of care, counseling/discussion 09/21/2018  ? Neck mass 09/12/2018  ? Cancer associated pain 09/08/2018  ? Financial difficulties 11/26/2017  ? Medically noncompliant 04/13/2017  ? Port catheter in place 04/01/2016  ? Epigastric pain 02/10/2016  ? Gastritis without bleeding 01/22/2016  ? Arrhythmia 12/25/2015  ? Infection of eyelash follicle of left eye 66/03/3015  ? Poor dentition 10/16/2015  ? 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 Other constipation 05/16/2015  ? Neck pain on right side 01/28/2015  ? Neuropathy due to chemotherapeutic drug ([place]) 10/25/2014  ? 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 Acquired hypothyroidism 06/09/2014  ? Malignant neoplasm of head and neck ([place]) 06/01/2014  ? Malignant neoplasm metastatic to lung ([place]) 06/01/2014  ? Chronic fatigue syndrome 06/01/2014  ? 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 ? ?Alvera Singh, PT ?01/16/2022, 9:27 AM ? ?[facility] ?[facility] ?[place], [place], [address] ?Phone: [phone]   Fax:  [phone] ? ?Name: Christian Simmons ?MRN: [id number] ?Date of Birth: [birth year] ? ? ? ?

## 2022-01-16 NOTE — Assessment & Plan Note (Signed)
He has chronic neck pain secondary to malignancy ?He is tolerating oxycodone taper, will continue to work with him to reduce pain medicine requirement ?

## 2022-01-16 NOTE — Progress Notes (Signed)
[facility] ?OFFICE PROGRESS NOTE ? ?Patient Care Team: ?Patient, No Pcp Per (Inactive) as PCP - General (General Practice) ?[name], MD as Consulting Physician (Hematology and Oncology) ?Eppie Gibson, MD as Attending Physician (Radiation Oncology) ?Jodi Marble, MD as Consulting Physician (Otolaryngology) ?Philomena Doheny, MD as Referring Physician (Plastic Surgery) ?Irene Shipper, MD as Consulting Physician (Gastroenterology) ?System, Provider Not In ? ?ASSESSMENT & PLAN:  ?Malignant neoplasm of head and neck ([place]) ?Clinically, his examination is benign ?There is no role for CT imaging at this point ?He will continue pembrolizumab every 6 weeks ?We will continue to monitor closely for toxicity ? ?Cancer associated pain ?He has chronic neck pain secondary to malignancy ?He is tolerating oxycodone taper, will continue to work with him to reduce pain medicine requirement ? ?Type 2 diabetes mellitus ([place]) ?He has more energy since he started fasting for religious reasons ?He will continue his best effort to monitor his carbohydrate intake ?We will continue his current medication ? ?Chronic limitation of movement of neck ?He has significant neck fibrosis and limited range of movement ?He is doing better with aggressive physical therapy and rehab ?He will continue to see therapist ? ?No orders of the defined types were placed in this encounter. ? ? ?All questions were answered. The patient knows to call the clinic with any problems, questions or concerns. ?The total time spent in the appointment was 20 minutes encounter with patients including review of chart and various tests results, discussions about plan of care and coordination of care plan ?  ?[name], MD ?01/16/2022 11:45 AM ? ?INTERVAL HISTORY: ?Please see below for problem oriented charting. ?he returns for treatment follow-up on maintenance pembrolizumab ?He is doing well ?He has to start fasting this month for Ramadan and is actually  giving him more energy level ?He is doing well with physical therapy and rehab ?He is able to get his pain medicine requirement down ? ?REVIEW OF SYSTEMS:   ?Constitutional: Denies fevers, chills or abnormal weight loss ?Eyes: Denies blurriness of vision ?Ears, nose, mouth, throat, and face: Denies mucositis or sore throat ?Respiratory: Denies cough, dyspnea or wheezes ?Cardiovascular: Denies palpitation, chest discomfort or lower extremity swelling ?Gastrointestinal:  Denies nausea, heartburn or change in bowel habits ?Skin: Denies abnormal skin rashes ?Lymphatics: Denies new lymphadenopathy or easy bruising ?Neurological:Denies numbness, tingling or new weaknesses ?Behavioral/Psych: Mood is stable, no new changes  ?All other systems were reviewed with the patient and are negative. ? ?I have reviewed the past medical history, past surgical history, social history and family history with the patient and they are unchanged from previous note. ? ?ALLERGIES:  is allergic to phenergan [promethazine hcl], heparin, and clindamycin. ? ?MEDICATIONS:  ?Current Outpatient Medications  ?Medication Sig Dispense Refill  ? blood glucose meter kit and supplies KIT Dispense based on patient and insurance preference. Use up to four times daily as directed. (FOR ICD-9 250.00, 250.01). 1 each 1  ? cetirizine (ZYRTEC) 10 MG tablet Take 1 tablet (10 mg total) by mouth daily as needed for allergies. 100 tablet 0  ? levothyroxine (SYNTHROID) 125 MCG tablet Take 1 tablet (125 mcg total) by mouth daily before breakfast. 60 tablet 1  ? metFORMIN (GLUCOPHAGE) 500 MG tablet Take 1 tablet (500 mg total) by mouth 2 (two) times daily with a meal. 60 tablet 2  ? ondansetron (ZOFRAN) 8 MG tablet Take 1 tablet (8 mg total) by mouth every 8 (eight) hours as needed for nausea or vomiting. [place]  tablet 1  ? oxyCODONE (OXY IR/ROXICODONE) 5 MG immediate release tablet Take 1 tablet (5 mg total) by mouth 3 (three) times daily as needed for severe pain. 90  tablet 0  ? ?No current facility-administered medications for this visit.  ? ?Facility-Administered Medications Ordered in Other Visits  ?Medication Dose Route Frequency Provider Last Rate Last Admin  ? anticoagulant sodium citrate solution 5 mL  5 mL Intracatheter Once Alvy Bimler, Yaretzi Ernandez, MD      ? anticoagulant sodium citrate solution 5 mL  5 mL Intracatheter Once [name], MD      ? ? ?SUMMARY OF ONCOLOGIC HISTORY: ?Oncology History Overview Note  ?Nasopharyngeal cancer ?  Primary site: Pharynx - Nasopharynx ?  Staging method: AJCC 7th Edition ?  Clinical: Stage IVC (T3, N2, M1) signed by [name], MD on 06/03/2014 10:08 PM ?  Summary: Stage IVC (T3, N2, M1) ?He was diagnosed in [place] and received treatment in [place] and [place]. Dates of therapy are approximates only due to poor records ? ? ?  ?Malignant neoplasm of head and neck ([place])  ?12/12/2006 Procedure  ? 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 Pan-endoscopy elsewhere showed cancer from nasopharyngeal space. ?  ?01/04/2007 - 02/20/2007 Chemotherapy  ? He received 2 cycles of cisplatin and 5FU followed by concurrent chemo with weekly cisplatin and radiation. He only received 2 doses of chemo due to severe mucositis, nausea and weight loss. ?  ?04/05/2007 - 08/04/2007 Chemotherapy  ? He received 4 more courses of cisplatin with 5FU and had complete response ?  ?07/05/2009 Procedure  ? Fine-needle aspirate of the right level II lymph nodes come from recurrent metastatic disease. Repeat endoscopy and CT scan show no evidence of disease elsewhere. ?  ?07/08/2009 - 12/02/2009 Chemotherapy  ? He was given 6 cycles of carboplatin, 5-FU and docetaxel ?  ?12/03/2009 Surgery  ? He has surgery to the residual lymph node on the right neck which showed no evidence of disease. ?  ?02/22/2012 Imaging  ? Repeat imaging study showed large recurrent mass. He was referred elsewhere for further treatment. ?  ?05/03/2012 Surgery  ? He underwent left upper lobectomy. ?   ?04/29/2013 Imaging  ? PEt scan showed lesion on right level II B and lower lung was abnormal ?  ?06/03/2013 - 02/02/2014 Chemotherapy  ? He had 6 cycles of chemotherapy when he was found to have recurrence of cancer and had received oxaliplatin and capecitabine ?  ?06/07/2014 Imaging  ? PET CT scan showed persistent disease in the right neck lymph nodes and left lung ?  ?06/29/2014 Procedure  ? Accession: [id number] repeat LUL biopsy confirmed metastatic cancer ?  ?07/18/2014 - 07/31/2014 Radiation Therapy  ? He received palliative radiation therapy to the lungs ?  ?10/10/2014 Imaging  ? CT scan of the chest, abdomen and pelvis show regression in the size of the lung nodule in the left upper lobe and stable pulmonary nodules ?  ?01/24/2015 Imaging  ? CT scan showed stable disease in neck and lung ?  ?06/19/2015 Imaging  ? CT scan of the neck and the chest show possible mild progression of the nodule in the right side of the neck. ?  ?06/25/2015 Imaging  ? PET scan confirmed disease recurrence in the neck ?  ?07/07/2015 Imaging  ? He had MRI neck [facility] ?  ?09/03/2015 - 08/26/2018 Chemotherapy  ? He received palliative chemo with Nivolumab ?  ?10/29/2015 Imaging  ? PET CT showed positive response  to Rx ?  ?02/28/2016 Imaging  ? Ct abdomen showed abnormal thinkening in his stomach ?  ?03/03/2016 Imaging  ? CT: Right sternocleidomastoid muscle metastasis appears less distinct but otherwise not significantly changed in size or configuration since 06/19/2015.2. Left level 3 lymph node which was hypermetabolic by PET-CT in January 2017 appears slightly smaller ?  ?04/01/2016 Imaging  ? CT cervical spine showed no acute fracture or traumatic malalignment in the cervical spine ?  ?04/22/2016 Procedure  ? Port-a-cath placed. ?  ?06/16/2016 Imaging  ? Ct neck showed right sternocleidomastoid muscle metastasis is further decreased in conspicuity since May, and has mildly decreased in size since September 2016. Continued stability of  sub-centimeter left cervical lymph nodes. No new or progressive metastatic disease in the neck. ?  ?06/16/2016 Imaging  ? CT chest showed stable masslike radiation fibrosis in the left upper lobe. Stable subcentimeter pul

## 2022-01-20 ENCOUNTER — Ambulatory Visit: Payer: Medicaid Other | Admitting: Rehabilitation

## 2022-01-20 ENCOUNTER — Encounter: Payer: Self-pay | Admitting: Rehabilitation

## 2022-01-20 DIAGNOSIS — M25611 Stiffness of right shoulder, not elsewhere classified: Secondary | ICD-10-CM

## 2022-01-20 DIAGNOSIS — M436 Torticollis: Secondary | ICD-10-CM

## 2022-01-20 DIAGNOSIS — C119 Malignant neoplasm of nasopharynx, unspecified: Secondary | ICD-10-CM

## 2022-01-20 DIAGNOSIS — Z483 Aftercare following surgery for neoplasm: Secondary | ICD-10-CM

## 2022-01-20 DIAGNOSIS — M542 Cervicalgia: Secondary | ICD-10-CM

## 2022-01-20 DIAGNOSIS — R29898 Other symptoms and signs involving the musculoskeletal system: Secondary | ICD-10-CM

## 2022-01-20 NOTE — Therapy (Signed)
?OUTPATIENT PHYSICAL THERAPY TREATMENT NOTE ? ? ?Patient Name: Christian Simmons ?MRN: [id number] ?DOB:13-Nov-1985, 36 y.o., male ?Today's Date: 01/20/2022 ? ?PCP: Patient, No Pcp Per (Inactive) ?REFERRING PROVIDER: [name], MD ? ? PT End of Session - 01/20/22 0802   ? ? Visit Number 17   ? Number of Visits 25   ? Date for PT Re-Evaluation 01/29/22   ? Authorization Time Period 20 visits 2/28-5/2   ? Authorization - Visit Number 11   ? Authorization - Number of Visits 20   ? PT Start Time 0802   ? PT Stop Time 0855   ? PT Time Calculation (min) 53 min   ? Activity Tolerance Patient tolerated treatment well   ? Behavior During Therapy [facility] for tasks assessed/performed   ? ?  ?  ? ?  ? ? ?Past Medical History:  ?Diagnosis Date  ? Arrhythmia 12/25/2015  ? Carcinoma ([place])   ? 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 Fatigue 06/01/2014  ? Hypothyroidism   ? Infection of eyelash follicle of left eye 5/63/1497  ? Metastasis to lung ([place]) 06/01/2014  ? 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 Neuropathy   ? Radiation 07/18/14-07/31/14  ? Left upper lobe  40 gy in 10 fractions  ? Seizures ([place])   ? 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  ? ?Past Surgical History:  ?Procedure Laterality Date  ? LUNG REMOVAL, PARTIAL  05/03/2012  ? left upper lobectomy  ? nasal biopsy    ? RADICAL NECK DISSECTION    ? VIDEO BRONCHOSCOPY N/A 06/29/2014  ? Procedure: VIDEO BRONCHOSCOPY ;  Surgeon: Melrose Nakayama, MD;  Location: [facility];  Service: Thoracic;  Laterality: N/A;  ? ?Patient Active Problem List  ? Diagnosis Date Noted  ? Anterior dislocation of right shoulder 12/01/2021  ? Chronic limitation of movement of neck 10/26/2021  ? Anxiety, generalized 06/18/2020  ? Preventive measure 06/13/2020  ? Liver lesion 10/05/2019  ? Type 2 diabetes mellitus ([place]) 04/06/2019  ? 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 Vitamin B12 deficiency 03/02/2019  ? Pancytopenia, acquired ([place]) 11/11/2018   ? 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 Nausea and vomiting 10/14/2018  ? Goals of care, counseling/discussion 09/21/2018  ? Neck mass 09/12/2018  ? Cancer associated pain 09/08/2018  ? Financial difficulties 11/26/2017  ? Medically noncompliant 04/13/2017  ? Port catheter in place 04/01/2016  ? Epigastric pain 02/10/2016  ? Gastritis without bleeding 01/22/2016  ? Arrhythmia 12/25/2015  ? Infection of eyelash follicle of left eye 02/63/7858  ? Poor dentition 10/16/2015  ? 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 Other constipation 05/16/2015  ? Neck pain on right side 01/28/2015  ? Neuropathy due to chemotherapeutic drug ([place]) 10/25/2014  ? 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 Acquired hypothyroidism 06/09/2014  ? Malignant neoplasm of head and neck ([place]) 06/01/2014  ? Malignant neoplasm metastatic to lung ([place]) 06/01/2014  ? Chronic fatigue syndrome 06/01/2014  ? 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 ? ?REFERRING DIAG: Neck pain and stiffness ? ?THERAPY DIAG:  ?Cervicalgia ? ?Stiffness of right shoulder, not elsewhere classified ? ?Aftercare following surgery for neoplasm ? ?Neck stiffness ? ?Malignant neoplasm of nasopharynx ([place]) ? ?Shoulder weakness ? ?PERTINENT HISTORY: Nasopharyngeal carcinoma diagnosed in 2008.  Chemotherapy completed.  Recurrence in 2010 lymph node with more chemotherapy. Removal of Rt neck lymph node showing NED.  Recurrence 2013 with Lt upper  lobectomy and then Rt lung lesion found 2014 with more chemotherapy (all previous treatment in [place]) .  Palliative radiation to lungs 2015. Palliative chemotherapy 2016-2019. (In [place])  2019: local recurrence Rt SCM and lymph nodes. Continuation of chemotherapy. Keytruda every 6 weeks.  ? ?PRECAUTIONS: cancer history with neck dissection ? ?SUBJECTIVE: Not sleeping well.  Very tight.  I've been in bed x 3 days after treatment  ? ?PAIN:  ?Are you having pain? No pain, just tight   ? ? ?TODAY'S TREATMENT:  ? 01/20/22 ? Supine cervical rotation x 5 bil AROM ?Alternating flexion x 5 bil ?Supine Protraction/Retraction: 4# 2x10  ?Prone extension over large ball 2# x 10 bil ?Horizontal abduction over large ball 2#  2x5 bil ?Sidelying ER 2x10 2# with towel roll in axilla bil  ?Sidelying Abduction x10 2# Rt ?Free motion ER walk outs 10# x 15 with towel roll for positioning, lat pull down 10# x 10bil highest arm position, tri extension highest arm position 10# x 10 bil ?Forearms on foam roll on wall pro/ret x 10 and rollin up and down x 10 ? ?STM in supine to Rt cervical paraspinals, suboccipitals, levator scapula, UT avoiding port and line ? ?PATIENT EDUCATION: ? ?HOME EXERCISE PROGRAM: ?Access Code: [email or web address] and meeks ? ? ? ? PT Long Term Goals - 12/18/21 1205   ? ?  ? PT LONG TERM GOAL #1  ? Title Pt will demonstrate appropriate sitting posture without need for vcs/tcs   ? Baseline This is improved and pt just requires min VCs now occassionally - 12/18/21   ? Status Partially Met   ?  ? PT LONG TERM GOAL #2  ? Title Pt will improve Rt shoulder abduction to at least 90deg to improve mobility   ? Baseline 60; 88 degrees - 12/18/21   ? Status On-going   ?  ? PT LONG TERM GOAL #3  ? Title Pt will be ind with final HEP for continued strengthening of the shoulder and postural muscles   ? Baseline Pt is independent with current HEP and is getting ready to start back at his gym, will benefit from progression of HEP and gym instruction-12/18/21   ? Status On-going   ?  ? PT LONG TERM GOAL #4  ? Title Pt will improve neck rotation to at least 60 deg bilaterally   ? Time 6   ? Period Weeks   ? Status New   ? ?  ?  ? ?  ? ? ? ? Clinical Impression Statement Decreased weight today due to fatigue post treatment but tolerated all well  ?  PT Frequency 2x / week   ?  PT Duration 6 weeks   ?  PT Treatment/Interventions Therapeutic exercise;ADLs/Self Care Home Management;Scar mobilization;Passive range of motion;Dry  needling;Patient/family education;Electrical Stimulation;Moist Heat;Manual techniques;Therapeutic activities;Taping   ?  PT Next Visit Plan DN for chronic neck pain and shoulder pain Rt- next week;  Cont kinesiotape,  Cont postural education and TE, neck MT   ?  PT Home Exercise Plan Access Code: [email or web address]; Meeks Decompression Exs   ?  Consulted and Agree with Plan of Care Patient   ? ? ? ? ?Stark Bray, PT ?01/20/2022, 8:55 AM ? ?  ? ?

## 2022-01-22 ENCOUNTER — Encounter: Payer: Self-pay | Admitting: Physical Therapy

## 2022-01-23 ENCOUNTER — Ambulatory Visit (HOSPITAL_BASED_OUTPATIENT_CLINIC_OR_DEPARTMENT_OTHER): Payer: Medicaid Other | Admitting: Orthopaedic Surgery

## 2022-01-26 ENCOUNTER — Telehealth: Payer: Self-pay

## 2022-01-26 ENCOUNTER — Other Ambulatory Visit (HOSPITAL_BASED_OUTPATIENT_CLINIC_OR_DEPARTMENT_OTHER): Payer: Self-pay

## 2022-01-26 ENCOUNTER — Other Ambulatory Visit: Payer: Self-pay

## 2022-01-26 MED ORDER — METFORMIN HCL 500 MG PO TABS
500.0000 mg | ORAL_TABLET | Freq: Two times a day (BID) | ORAL | 2 refills | Status: DC
  Filled 2022-01-26: qty 60, 30d supply, fill #0
  Filled 2022-03-17: qty 60, 30d supply, fill #1
  Filled 2022-05-02: qty 60, 30d supply, fill #2

## 2022-01-26 MED ORDER — LEVOTHYROXINE SODIUM 125 MCG PO TABS
125.0000 ug | ORAL_TABLET | Freq: Every day | ORAL | 1 refills | Status: DC
  Filled 2022-01-26: qty 60, 60d supply, fill #0
  Filled 2022-05-02: qty 60, 60d supply, fill #1

## 2022-01-26 NOTE — Telephone Encounter (Signed)
Returned his call. He traveled over the weekend and has lost Synthroid and Metformin Rx. Sent Rx refills to [facility]. ?He is willing to pay out of pocket if insurance will not pay. ? ?He also lost Oxycodone Rx and is asking if you will send to [place]? He is taking Aleve for pain now. ?

## 2022-01-26 NOTE — Telephone Encounter (Signed)
No, pain medicine cannot be refilled even if he lost it ?

## 2022-01-27 ENCOUNTER — Encounter: Payer: Self-pay | Admitting: Hematology and Oncology

## 2022-01-27 ENCOUNTER — Other Ambulatory Visit (HOSPITAL_BASED_OUTPATIENT_CLINIC_OR_DEPARTMENT_OTHER): Payer: Self-pay

## 2022-01-27 ENCOUNTER — Ambulatory Visit: Payer: Medicaid Other

## 2022-01-27 NOTE — Telephone Encounter (Signed)
Attempted to call and mail box full. Unable to leave a message. ?

## 2022-01-27 NOTE — Telephone Encounter (Signed)
Called him back and given message that Dr. Alvy Bimler is unable to send Oxycodone Rx even if he lost Rx. He verbalized understanding and will take Aleve and Tylenol for pain. He will call the office back when Rx due to refill. ?

## 2022-01-28 ENCOUNTER — Ambulatory Visit (HOSPITAL_BASED_OUTPATIENT_CLINIC_OR_DEPARTMENT_OTHER): Payer: Medicaid Other | Admitting: Orthopaedic Surgery

## 2022-01-28 NOTE — Therapy (Addendum)
OUTPATIENT PHYSICAL THERAPY TREATMENT NOTE   Patient Name: Christian Simmons MRN: [id number] DOB:08-23-1986, 36 y.o., male Today's Date: 01/29/2022  PCP: Patient, No Pcp Per (Inactive) REFERRING PROVIDER: [name], MD   PT End of Session - 01/29/22 0932     Visit Number 18    Date for PT Re-Evaluation 03/06/22    Authorization Type [employer]- date extension requested    Authorization - Visit Number 12    Authorization - Number of Visits 20    PT Start Time [phone]   late   PT Stop Time 0932    PT Time Calculation (min) 36 min    Activity Tolerance Patient tolerated treatment well    Behavior During Therapy [facility] for tasks assessed/performed              Past Medical History:  Diagnosis Date   Arrhythmia 12/25/2015   Carcinoma ([place])    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, unsp ([place]) 03/02/2019   Fatigue 06/01/2014   Hypothyroidism    Infection of eyelash follicle of left eye 7/62/2633   Metastasis to lung ([place]) 06/01/2014   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   Neuropathy    Radiation 07/18/14-07/31/14   Left upper lobe  40 gy in 10 fractions   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   Past Surgical History:  Procedure Laterality Date   LUNG REMOVAL, PARTIAL  05/03/2012   left upper lobectomy   nasal biopsy     RADICAL NECK DISSECTION     VIDEO BRONCHOSCOPY N/A 06/29/2014   Procedure: VIDEO BRONCHOSCOPY ;  Surgeon: Melrose Nakayama, MD;  Location: [place];  Service: Thoracic;  Laterality: Gretchen Short, PT 01/29/2022, 11:05 AM     OUTPATIENT PHYSICAL THERAPY TREATMENT NOTE   Patient Name: Christian Simmons MRN: [id number] DOB:06-29-1986, 36 y.o., male Today's Date: 01/29/2022  PCP: Patient, No Pcp Per (Inactive) REFERRING PROVIDER: [name], MD   PT End of Session - 01/29/22 0932     Visit Number 18    Date for PT Re-Evaluation 03/06/22    Authorization Type [employer]- date extension requested    Authorization - Visit  Number 12    Authorization - Number of Visits 20    PT Start Time [phone]   late   PT Stop Time 0932    PT Time Calculation (min) 36 min    Activity Tolerance Patient tolerated treatment well    Behavior During Therapy [facility] for tasks assessed/performed              Past Medical History:  Diagnosis Date   Arrhythmia 12/25/2015   Carcinoma ([place])    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, unsp ([place]) 03/02/2019   Fatigue 06/01/2014   Hypothyroidism    Infection of eyelash follicle of left eye 3/42/8768   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/01/2014   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   Neuropathy    Radiation 07/18/14-07/31/14   Left upper lobe  40 gy in 10 fractions   Seizures ([place]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   Past Surgical History:  Procedure Laterality Date   LUNG REMOVAL, PARTIAL  05/03/2012   left upper lobectomy   nasal biopsy     RADICAL NECK DISSECTION     VIDEO BRONCHOSCOPY N/A 06/29/2014   Procedure: VIDEO BRONCHOSCOPY ;  Surgeon: Melrose Nakayama, MD;  Location: [place];  Service: Thoracic;  Laterality: N/A;   Patient Active Problem  List   Diagnosis Date Noted   Anterior dislocation of right shoulder 12/01/2021   Chronic limitation of movement of neck 10/26/2021   Anxiety, generalized 06/18/2020   Preventive measure 06/13/2020   Liver lesion 10/05/2019   Type 2 diabetes mellitus ([place]) 04/06/2019   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, unsp ([place]) 03/02/2019   Vitamin B12 deficiency 03/02/2019   Pancytopenia, acquired ([place]) 11/11/2018   Malignant cachexia ([place]) 11/04/2018   Nausea and vomiting 10/14/2018   Goals of care, counseling/discussion 09/21/2018   Neck mass 09/12/2018   Cancer associated pain 09/08/2018   Financial difficulties 11/26/2017   Medically noncompliant 04/13/2017   Port catheter in place 04/01/2016   Epigastric pain 02/10/2016   Gastritis without bleeding 01/22/2016   Arrhythmia 12/25/2015   Infection of eyelash  follicle of left eye 97/67/3419   Poor dentition 10/16/2015   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   Other constipation 05/16/2015   Neck pain on right side 01/28/2015   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  Chemotherapy completed.  Recurrence in 2010 lymph node with more chemotherapy. Removal of Rt neck lymph node showing NED.  Recurrence 2013 with Lt upper lobectomy and then Rt lung lesion found 2014 with more chemotherapy (all previous treatment in [place]) .  Palliative radiation to lungs 2015. Palliative chemotherapy 2016-2019. (In [place])  2019: local recurrence Rt SCM and lymph nodes. Continuation of chemotherapy. Keytruda every 6 weeks.   PRECAUTIONS: cancer history with neck dissection  SUBJECTIVE: Lifting overhead is challenging.    PAIN:  Are you having pain? No pain, just tight  Neck pain: 4-5/10 max, stiffness in Rt shoulder.    TODAY'S TREATMENT:  Treatment on date: 01/29/22 Trigger Point Dry-Needling  Treatment instructions: Expect mild to moderate muscle soreness. S/S of pneumothorax if dry needled over a lung field, and to seek immediate medical attention should they occur. Patient verbalized understanding of these instructions and education.  Patient Consent Given: Yes Education handout  provided: Previously provided Muscles treated: bil cervical multifidi, Rt upper trap, Rt subscapularis, Rt posterior deltoid Treatment response/outcome: twitch response and improved tissue mobility  Manual: elongation to muscles after needling Skilled palpation and monitoring by PT during dry needling     01/20/22  Supine cervical rotation x 5 bil AROM Alternating flexion x 5 bil Supine Protraction/Retraction: 4# 2x10  Prone extension over large ball 2# x 10 bil Horizontal abduction over large ball 2#  2x5 bil Sidelying ER 2x10 2# with towel roll in axilla bil  Sidelying Abduction x10 2# Rt Free motion ER walk outs 10# x 15 with towel roll for positioning, lat pull down 10# x 10bil highest arm position, tri extension highest arm position 10# x 10 bil Forearms on foam roll on wall pro/ret x 10 and rollin up and down x 10  STM in supine to Rt cervical paraspinals, suboccipitals,  levator scapula, UT avoiding port and line  PATIENT EDUCATION:  HOME EXERCISE PROGRAM: PATIENT EDUCATION: Education details: Access Code: [email or web address]; Meeks Decompression Exs  Person educated: Patient Education method: Explanation, Demonstration, and Handouts Education comprehension: verbalized understanding and returned demonstration   HOME EXERCISE PROGRAM: Access Code: [email or web address];   OBJECTIVE:  MMT: Rt shoulder flexion 4+/5, abduction 4/5 with pain, IR/ER 5/5 Rt shoulder A/ROM: flexion: 110 degrees, abduction 70, IR is full, ER limited by 2" vs the Lt  Cervical A/ROM: rotation Lt 20 degrees, Rt 40 degrees, sidebending 15 to Lt, 25 to Rt   CLINICAL IMPRESSION:  Pt with continued cervical and Rt UE A/ROM restrictions associated with soft tissue limitations due to radiation.  Pt with reduced postural strength and difficulty with sitting with neutral posture.  Pt demonstrates significant limitation with cervical rotation, sidebending and Rt shoulder abduction.  Session focused on manual therapy to address tissue  restrictions in the scapula, neck and posterior shoulder.  Pt was encouraged to do soft tissue work to his own neck to reduce sensitivity and improve muscle length.  Patient will benefit from skilled PT to address the below impairments and improve overall function.      OBJECTIVE IMPAIRMENTS decreased activity tolerance, decreased endurance, decreased mobility, decreased ROM, decreased strength, hypomobility, increased fascial restrictions, increased muscle spasms, impaired flexibility, impaired UE functional use, improper body mechanics, postural dysfunction, and pain.   ACTIVITY LIMITATIONS driving, laundry, yard work, and shopping.   PERSONAL FACTORS 3+ comorbidities: time since onset, past/current experience, radiation, multiple surgeries  are also affecting patient's functional outcome.    REHAB POTENTIAL: Good    GOALS: Goals reviewed with patient? Yes   LONG TERM GOALS: Target date: 03/09/22  Pt will demonstrate appropriate sitting posture without need for vcs/tcs  Baseline:  This is improved and pt just requires min VCs now occassionally -  Goal status: IN PROGRESS  2.  Pt will improve Rt shoulder abduction to at least 90deg to improve mobility  Baseline: 70 (01/29/22) Goal status: IN PROGRESS  3.  Pt will be ind with final HEP for continued strengthening of the shoulder and postural muscles  Baseline:  further progress is needed Goal status: IN PROGRESS  4.  Pt will improve neck rotation to at least 45 deg bilaterally  Baseline: 20 Lt, 40 Rt Goal status: REVISED  5.  Improve Rt shoulder strength to reach and lift  overhead to ~110 degrees without limitation Baseline:  difficulty reaching overhead Goal status: INITIAL  6.  Verbalize and demonstrate postural corrections to reduce Rt shoulder impingement  Baseline: forward shoulder and scapular atrophy on the Rt Goal status: INITIAL   PLAN: PT FREQUENCY: 2x/week  PT DURATION: 5 weeks   PLANNED INTERVENTIONS:  Therapeutic exercises, Therapeutic activity, Neuromuscular re-education, Balance training, Gait training, Patient/Family education, Joint mobilization, Dry Needling, Spinal manipulation, Spinal mobilization, Cryotherapy, Moist heat, Manual lymph drainage, scar mobilization, Taping, Traction, and Manual therapy  PLAN FOR NEXT SESSION: Continue DN and tissue mobility of Rt neck and scapula, postural strength, Rt shoulder ROM   Sigurd Sos, PT 01/29/22 11:05 AM    PHYSICAL THERAPY DISCHARGE SUMMARY  Visits from Start of Care: 18  Current functional level related to goals / functional outcomes: See above for most current PT status.  Pt didn't return after last session.   Remaining deficits: See above   Education / Equipment: HEP   Patient agrees to discharge. Patient goals were partially met. Patient is being discharged due to not returning since  the last visit.  Sigurd Sos, PT 06/04/22 7:51 AM

## 2022-01-29 ENCOUNTER — Ambulatory Visit: Payer: Medicaid Other

## 2022-01-29 DIAGNOSIS — M542 Cervicalgia: Secondary | ICD-10-CM | POA: Diagnosis not present

## 2022-01-29 DIAGNOSIS — M25611 Stiffness of right shoulder, not elsewhere classified: Secondary | ICD-10-CM

## 2022-01-29 DIAGNOSIS — Z483 Aftercare following surgery for neoplasm: Secondary | ICD-10-CM

## 2022-02-03 ENCOUNTER — Encounter: Payer: Self-pay | Admitting: Rehabilitation

## 2022-02-03 NOTE — Therapy (Incomplete)
?OUTPATIENT PHYSICAL THERAPY TREATMENT NOTE ? ? ?Patient Name: Christian Simmons ?MRN: [id number] ?DOB:[birth year], 36 y.o., male ?Today's Date: 02/03/2022 ? ?PCP: Patient, No Pcp Per (Inactive) ?REFERRING PROVIDER: [name], MD ? ? ? ? ? ?Past Medical History:  ?Diagnosis Date  ? Arrhythmia 12/25/2015  ? Carcinoma ([place])   ? 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 Fatigue 06/01/2014  ? Hypothyroidism   ? Infection of eyelash follicle of left eye 0/07/711  ? Metastasis to lung ([place]) 06/01/2014  ? 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 Neuropathy   ? Radiation 07/18/14-07/31/14  ? Left upper lobe  40 gy in 10 fractions  ? Seizures ([place])   ? 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  ? ?Past Surgical History:  ?Procedure Laterality Date  ? LUNG REMOVAL, PARTIAL  05/03/2012  ? left upper lobectomy  ? nasal biopsy    ? RADICAL NECK DISSECTION    ? VIDEO BRONCHOSCOPY N/A 06/29/2014  ? Procedure: VIDEO BRONCHOSCOPY ;  Surgeon: Melrose Nakayama, MD;  Location: [place];  Service: Thoracic;  Laterality: N/A;  ? ? ?Stark Bray, PT ?02/03/2022, 6:44 AM ? ?  ? ?OUTPATIENT PHYSICAL THERAPY TREATMENT NOTE ? ? ?Patient Name: Christian Simmons ?MRN: [id number] ?DOB:1986/03/19, 36 y.o., male ?Today's Date: 02/03/2022 ? ?PCP: Patient, No Pcp Per (Inactive) ?REFERRING PROVIDER: [name], MD ? ? ? ? ? ?Past Medical History:  ?Diagnosis Date  ? Arrhythmia 12/25/2015  ? Carcinoma ([place])   ? 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 Fatigue 06/01/2014  ? Hypothyroidism   ? Infection of eyelash follicle of left eye 4/98/2641  ? Metastasis to lung ([place]) 06/01/2014  ? 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 Neuropathy   ? Radiation 07/18/14-07/31/14  ? Left upper lobe  40 gy in 10 fractions  ? Seizures ([place])   ? 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  ? ?Past Surgical History:  ?Procedure Laterality Date  ? LUNG REMOVAL, PARTIAL  05/03/2012  ? left upper lobectomy  ? nasal biopsy    ? RADICAL NECK DISSECTION    ?  VIDEO BRONCHOSCOPY N/A 06/29/2014  ? Procedure: VIDEO BRONCHOSCOPY ;  Surgeon: Melrose Nakayama, MD;  Location: [place];  Service: Thoracic;  Laterality: N/A;  ? ?Patient Active Problem List  ? Diagnosis Date Noted  ? Anterior dislocation of right shoulder 12/01/2021  ? Chronic limitation of movement of neck 10/26/2021  ? Anxiety, generalized 06/18/2020  ? Preventive measure 06/13/2020  ? Liver lesion 10/05/2019  ? Type 2 diabetes mellitus ([place]) 04/06/2019  ? 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 Vitamin B12 deficiency 03/02/2019  ? Pancytopenia, acquired ([place]) 11/11/2018  ? Malignant cachexia ([place]) 11/04/2018  ? Nausea and vomiting 10/14/2018  ? Goals of care, counseling/discussion 09/21/2018  ? Neck mass 09/12/2018  ? Cancer associated pain 09/08/2018  ? Financial difficulties 11/26/2017  ? Medically noncompliant 04/13/2017  ? Port catheter in place 04/01/2016  ? Epigastric pain 02/10/2016  ? Gastritis without bleeding 01/22/2016  ? Arrhythmia 12/25/2015  ? Infection of eyelash follicle of left eye 58/30/9407  ? Poor dentition 10/16/2015  ? 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 Other constipation 05/16/2015  ? Neck pain on right side 01/28/2015  ? 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 Acquired hypothyroidism 06/09/2014  ? Malignant neoplasm of head and neck ([place]) 06/01/2014  ?  Malignant neoplasm metastatic to lung ([place]) 06/01/2014  ? Chronic fatigue syndrome 06/01/2014  ? 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 ? ?REFERRING DIAG: Neck pain and stiffness ? ?THERAPY DIAG:  ?No diagnosis found. ? ?PERTINENT HISTORY: Nasopharyngeal carcinoma diagnosed in 2008.  Chemotherapy completed.  Recurrence in 2010 lymph node with more chemotherapy. Removal of Rt neck lymph node showing NED.  Recurrence 2013 with Lt upper lobectomy and then Rt lung lesion found 2014  with more chemotherapy (all previous treatment in [place]) .  Palliative radiation to lungs 2015. Palliative chemotherapy 2016-2019. (In [place])  2019: local recurrence Rt SCM and lymph nodes. Continuation of chemotherapy. Keytruda every 6 weeks.  ? ?PRECAUTIONS: cancer history with neck dissection ? ?SUBJECTIVE: Lifting overhead is challenging.   ? ?PAIN:  ?Are you having pain? No pain, just tight  ?Neck pain: 4-5/10 max, stiffness in Rt shoulder.  ? ? ?TODAY'S TREATMENT:  ?02/03/22 ?Supine cervical rotation x 5 bil AROM ?Alternating flexion x 5 bil ?Supine Protraction/Retraction: 4# 2x10  ?Prone extension over large ball 2# x 10 bil ?Horizontal abduction over large ball 2#  2x5 bil ?Sidelying ER 2x10 2# with towel roll in axilla bil  ?Sidelying Abduction x10 2# Rt ?Free motion ER walk outs 10# x 15 with towel roll for positioning, lat pull down 10# x 10bil highest arm position, tri extension highest arm position 10# x 10 bil ?Forearms on foam roll on wall pro/ret x 10 and rollin up and down x 10 ? ?STM in supine to Rt cervical paraspinals, suboccipitals, levator scapula, UT avoiding port and line ? ? ?Treatment on date: 01/29/22 ?Trigger Point Dry-Needling  ?Treatment instructions: Expect mild to moderate muscle soreness. S/S of pneumothorax if dry needled over a lung field, and to seek immediate medical attention should they occur. Patient verbalized understanding of these instructions and education. ? ?Patient Consent Given: Yes ?Education handout provided: Previously provided ?Muscles treated: bil cervical multifidi, Rt upper trap, Rt subscapularis, Rt posterior deltoid ?Treatment response/outcome: twitch response and improved tissue mobility  ?Manual: elongation to muscles after needling ?Skilled palpation and monitoring by PT during dry needling  ?  ? 01/20/22 ? Supine cervical rotation x 5 bil AROM ?Alternating flexion x 5 bil ?Supine Protraction/Retraction: 4# 2x10  ?Prone extension over large ball 2# x 10  bil ?Horizontal abduction over large ball 2#  2x5 bil ?Sidelying ER 2x10 2# with towel roll in axilla bil  ?Sidelying Abduction x10 2# Rt ?Free motion ER walk outs 10# x 15 with towel roll for positioning, lat pull down 10# x 10bil highest arm position, tri extension highest arm position 10# x 10 bil ?Forearms on foam roll on wall pro/ret x 10 and rollin up and down x 10 ? ?STM in supine to Rt cervical paraspinals, suboccipitals, levator scapula, UT avoiding port and line ? ?PATIENT EDUCATION: ? ?HOME EXERCISE PROGRAM: ?PATIENT EDUCATION: ?Education details: Access Code: [email or web address]; Meeks Decompression Exs  ?Person educated: Patient ?Education method: Explanation, Demonstration, and Handouts ?Education comprehension: verbalized understanding and returned demonstration ? ? ?HOME EXERCISE PROGRAM: ?Access Code: [email or web address];  ? ?OBJECTIVE:  ?MMT: Rt shoulder flexion 4+/5, abduction 4/5 with pain, IR/ER 5/5 ?Rt shoulder A/ROM: flexion: 110 degrees, abduction 70, IR is full, ER limited by 2" vs the Lt  ?Cervical A/ROM: rotation Lt 20 degrees, Rt 40 degrees, sidebending 15 to Lt, 25 to Rt ? ? ?CLINICAL IMPRESSION:  ? ? ? ?OBJECTIVE IMPAIRMENTS decreased activity tolerance, decreased endurance, decreased mobility, decreased ROM, decreased strength, hypomobility,  increased fascial restrictions, increased muscle spasms, impaired flexibility, impaired UE functional use, improper body mechanics, postural dysfunction, and pain.  ? ?ACTIVITY LIMITATIONS driving, laundry, yard work, and shopping.  ? ?PERSONAL FACTORS 3+ comorbidities: time since onset, past/current experience, radiation, multiple surgeries  are also affecting patient's functional outcome.  ? ? ?REHAB POTENTIAL: Good ? ? ? ?GOALS: ?Goals reviewed with patient? Yes ? ? ?LONG TERM GOALS: Target date: 03/09/22 ? ?Pt will demonstrate appropriate sitting posture without need for vcs/tcs  ?Baseline:  This is improved and pt just requires min VCs now occassionally -  ?Goal  status: IN PROGRESS ? ?2.  Pt will improve Rt shoulder abduction to at least 90deg to improve mobility  ?Baseline: 70 (01/29/22) ?Goal status: IN PROGRESS ? ?3.  Pt will be ind with final HEP for continued strengthe

## 2022-02-05 ENCOUNTER — Other Ambulatory Visit: Payer: Self-pay

## 2022-02-05 ENCOUNTER — Telehealth: Payer: Self-pay

## 2022-02-05 ENCOUNTER — Other Ambulatory Visit (HOSPITAL_BASED_OUTPATIENT_CLINIC_OR_DEPARTMENT_OTHER): Payer: Self-pay

## 2022-02-05 ENCOUNTER — Other Ambulatory Visit: Payer: Self-pay | Admitting: Hematology and Oncology

## 2022-02-05 MED ORDER — OXYCODONE HCL 5 MG PO TABS
5.0000 mg | ORAL_TABLET | Freq: Three times a day (TID) | ORAL | 0 refills | Status: DC | PRN
  Filled 2022-02-05: qty 90, 30d supply, fill #0

## 2022-02-05 MED ORDER — ONDANSETRON HCL 8 MG PO TABS
8.0000 mg | ORAL_TABLET | Freq: Three times a day (TID) | ORAL | 1 refills | Status: DC | PRN
  Filled 2022-02-05: qty 90, 30d supply, fill #0
  Filled 2022-03-17: qty 90, 30d supply, fill #1

## 2022-02-05 NOTE — Telephone Encounter (Signed)
done

## 2022-02-05 NOTE — Telephone Encounter (Signed)
He called and left a message requesting refill of Oxycodone Rx to [facility]. ?

## 2022-02-05 NOTE — Telephone Encounter (Signed)
Called and told Rx sent. He is requesting Zofran Rx to pharmacy, Rx sent. He verbalized understanding. ?

## 2022-02-10 ENCOUNTER — Ambulatory Visit: Payer: Medicaid Other | Admitting: Rehabilitation

## 2022-02-13 ENCOUNTER — Ambulatory Visit: Payer: Medicaid Other | Admitting: Physical Therapy

## 2022-02-16 ENCOUNTER — Telehealth: Payer: Self-pay

## 2022-02-16 NOTE — Telephone Encounter (Signed)
Returned his call. He is having pain in his neck x 3 weeks. A different type of neck pain, not like the cancer pain. ?The pain is from his shoulders up to his head. ?He has been exercising some. He has tried heat to his neck. He is taking the Oxycodone as prescribed and does not want to increase the oxycodone. He is taking Naproxen and ibuprofen. He does some get some relief from the Naproxen. ?He is not sleeping and having a lot of fatigue due to not sleeping.  ? ?He is asking if you have any recommendations? ?

## 2022-02-16 NOTE — Telephone Encounter (Signed)
Called and given below message. He verbalized understanding and will maybe try a muscle rub cream to see if that helps. ?

## 2022-02-16 NOTE — Telephone Encounter (Signed)
Try positioning his neck with an extra pillow ?

## 2022-02-19 ENCOUNTER — Telehealth: Payer: Self-pay

## 2022-02-19 ENCOUNTER — Other Ambulatory Visit (HOSPITAL_COMMUNITY): Payer: Self-pay

## 2022-02-19 ENCOUNTER — Inpatient Hospital Stay: Payer: Medicaid Other | Attending: Hematology and Oncology | Admitting: Hematology and Oncology

## 2022-02-19 ENCOUNTER — Encounter: Payer: Self-pay | Admitting: Hematology and Oncology

## 2022-02-19 ENCOUNTER — Other Ambulatory Visit: Payer: Self-pay

## 2022-02-19 VITALS — BP 128/80 | HR 73 | Resp 18

## 2022-02-19 DIAGNOSIS — C760 Malignant neoplasm of head, face and neck: Secondary | ICD-10-CM | POA: Insufficient documentation

## 2022-02-19 DIAGNOSIS — Z5112 Encounter for antineoplastic immunotherapy: Secondary | ICD-10-CM | POA: Insufficient documentation

## 2022-02-19 DIAGNOSIS — G893 Neoplasm related pain (acute) (chronic): Secondary | ICD-10-CM | POA: Insufficient documentation

## 2022-02-19 MED ORDER — METHADONE HCL 10 MG PO TABS
10.0000 mg | ORAL_TABLET | Freq: Two times a day (BID) | ORAL | 0 refills | Status: DC
  Filled 2022-02-19: qty 30, 15d supply, fill #0

## 2022-02-19 NOTE — Telephone Encounter (Signed)
Returned his call. He is in constant neck pain. He is taking tylenol, Naproxen and Oxycodone pain. He has tried all the suggestions and get relieve for a short period. Given appt at 1220 today to see Dr. Alvy Bimler. He is aware of appt time.

## 2022-02-20 ENCOUNTER — Encounter: Payer: Self-pay | Admitting: Hematology and Oncology

## 2022-02-20 NOTE — Assessment & Plan Note (Signed)
His severe neck pain is related to complication from prior surgery and radiation, causing severe fibrosis and limitation of range of motion of his neck His pain also coincide with recent oxycodone taper The patient have difficulties with activities of daily living because of this I recommend increasing the dose of his pain medicine back to somewhere between 5 to 10 mg I do not plan to taper his pain medicine in the future I also recommend trial of long-acting pain medicine with methadone I will see him again next week for further follow-up I warned him about risk of sedation and constipation

## 2022-02-20 NOTE — Assessment & Plan Note (Signed)
The patient have no signs of recurrent disease in his neck His severe neck pain is related to prior treatment related fibrosis I will see him again for his next treatment for further follow-up

## 2022-02-20 NOTE — Progress Notes (Signed)
[facility] OFFICE PROGRESS NOTE  Patient Care Team: Patient, No Pcp Per (Inactive) as PCP - General (General Practice) [name], MD as Consulting Physician (Hematology and Oncology) Eppie Gibson, MD as Attending Physician (Radiation Oncology) Jodi Marble, MD as Consulting Physician (Otolaryngology) Philomena Doheny, MD as Referring Physician (Plastic Surgery) Irene Shipper, MD as Consulting Physician (Gastroenterology) System, Provider Not In  ASSESSMENT & PLAN:  Malignant neoplasm of head and neck [facility]) The patient have no signs of recurrent disease in his neck His severe neck pain is related to prior treatment related fibrosis I will see him again for his next treatment for further follow-up  Cancer associated pain His severe neck pain is related to complication from prior surgery and radiation, causing severe fibrosis and limitation of range of motion of his neck His pain also coincide with recent oxycodone taper The patient have difficulties with activities of daily living because of this I recommend increasing the dose of his pain medicine back to somewhere between 5 to 10 mg I do not plan to taper his pain medicine in the future I also recommend trial of long-acting pain medicine with methadone I will see him again next week for further follow-up I warned him about risk of sedation and constipation  No orders of the defined types were placed in this encounter.   All questions were answered. The patient knows to call the clinic with any problems, questions or concerns. The total time spent in the appointment was 25 minutes encounter with patients including review of chart and various tests results, discussions about plan of care and coordination of care plan   [name], MD 02/20/2022 8:55 AM  INTERVAL HISTORY: Please see below for problem oriented charting. he returns for urgent evaluation due to severe neck pain The patient is taking oxycodone 3  times a day, Tylenol around-the-clock and ibuprofen in between along with muscle relaxant He have difficulties with sleep and activities of daily living He denies recent trauma He is attempting to perform physical therapy to improve range of motion of his neck recently  REVIEW OF SYSTEMS:   Constitutional: Denies fevers, chills or abnormal weight loss Eyes: Denies blurriness of vision Ears, nose, mouth, throat, and face: Denies mucositis or sore throat Respiratory: Denies cough, dyspnea or wheezes Cardiovascular: Denies palpitation, chest discomfort or lower extremity swelling Gastrointestinal:  Denies nausea, heartburn or change in bowel habits Skin: Denies abnormal skin rashes Lymphatics: Denies new lymphadenopathy or easy bruising Neurological:Denies numbness, tingling or new weaknesses Behavioral/Psych: Mood is stable, no new changes  All other systems were reviewed with the patient and are negative.  I have reviewed the past medical history, past surgical history, social history and family history with the patient and they are unchanged from previous note.  ALLERGIES:  is allergic to phenergan [promethazine hcl], heparin, and clindamycin.  MEDICATIONS:  Current Outpatient Medications  Medication Sig Dispense Refill   methadone (DOLOPHINE) 10 MG tablet Take 1 tablet (10 mg total) by mouth every 12 (twelve) hours. 30 tablet 0   blood glucose meter kit and supplies KIT Dispense based on patient and insurance preference. Use up to four times daily as directed. (FOR ICD-9 250.00, 250.01). 1 each 1   cetirizine (ZYRTEC) 10 MG tablet Take 1 tablet (10 mg total) by mouth daily as needed for allergies. 100 tablet 0   levothyroxine (SYNTHROID) 125 MCG tablet Take 1 tablet (125 mcg total) by mouth daily before breakfast. 60 tablet 1  metFORMIN (GLUCOPHAGE) 500 MG tablet Take 1 tablet (500 mg total) by mouth 2 (two) times daily with a meal. 60 tablet 2   ondansetron (ZOFRAN) 8 MG tablet Take  1 tablet (8 mg total) by mouth every 8 (eight) hours as needed for nausea or vomiting. 90 tablet 1   oxyCODONE (OXY IR/ROXICODONE) 5 MG immediate release tablet Take 1 tablet (5 mg total) by mouth 3 (three) times daily as needed for severe pain. 90 tablet 0   No current facility-administered medications for this visit.   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 Pan-endoscopy elsewhere showed cancer from nasopharyngeal space.    01/04/2007 - 02/20/2007 Chemotherapy   He received 2 cycles of cisplatin and 5FU followed by concurrent chemo with weekly cisplatin and radiation. He only received 2 doses of chemo due to severe mucositis, nausea and weight loss.    04/05/2007 - 08/04/2007 Chemotherapy   He received 4 more courses of cisplatin with 5FU and had complete response    07/05/2009 Procedure   Fine-needle aspirate of the right level II lymph nodes come from recurrent metastatic disease. Repeat endoscopy and CT scan show no evidence of disease elsewhere.    07/08/2009 - 12/02/2009 Chemotherapy   He was given 6 cycles of carboplatin, 5-FU and docetaxel    12/03/2009 Surgery   He has surgery to the residual  lymph node on the right neck which showed no evidence of disease.    02/22/2012 Imaging   Repeat imaging study showed large recurrent mass. He was referred elsewhere for further treatment.    05/03/2012 Surgery   He underwent left upper lobectomy.    04/29/2013 Imaging   PEt scan showed lesion on right level II B and lower lung was abnormal    06/03/2013 - 02/02/2014 Chemotherapy   He had 6 cycles of chemotherapy when he was found to have recurrence of cancer and had received oxaliplatin and capecitabine    06/07/2014 Imaging   PET CT scan showed persistent disease in the right neck lymph nodes and left lung    06/29/2014 Procedure   Accession: [id number] repeat LUL biopsy confirmed metastatic cancer    07/18/2014 - 07/31/2014 Radiation Therapy   He received palliative radiation therapy to the lungs    10/10/2014 Imaging   CT scan of the chest, abdomen and pelvis show regression in the size of the lung nodule in the left upper lobe and stable pulmonary nodules    01/24/2015 Imaging   CT scan showed stable disease in neck and lung    06/19/2015 Imaging   CT scan of the neck and the chest show  possible mild progression of the nodule in the right side of the neck.    06/25/2015 Imaging   PET scan confirmed disease recurrence in the neck    07/07/2015 Imaging   He had MRI neck at [facility]    09/03/2015 - 08/26/2018 Chemotherapy   He received palliative chemo with Nivolumab    10/29/2015 Imaging   PET CT showed positive response to Rx    02/28/2016 Imaging   Ct abdomen showed abnormal thinkening in his stomach    03/03/2016 Imaging   CT: Right sternocleidomastoid muscle metastasis appears less distinct but otherwise not significantly changed in size or configuration since 06/19/2015.2. Left level 3 lymph node which was hypermetabolic by PET-CT in January 2017 appears slightly smaller    04/01/2016 Imaging   CT cervical spine showed no acute fracture or traumatic malalignment  in the cervical spine    04/22/2016 Procedure   Port-a-cath placed.    06/16/2016 Imaging   Ct neck showed right sternocleidomastoid muscle metastasis is further decreased in conspicuity since May, and has mildly decreased in size since September 2016. Continued stability of sub-centimeter left cervical lymph nodes. No new or progressive metastatic disease in the neck.    06/16/2016 Imaging   CT chest showed stable masslike radiation fibrosis in the left upper lobe. Stable subcentimeter pulmonary nodules in the bilateral lower lobes. No new or progressive metastatic disease in the chest. Nonobstructing left renal stone.    10/13/2016 Imaging   Ct neck showed unchanged right sternocleidomastoid muscle metastasis. Unchanged subcentimeter left cervical lymph nodes. No evidence of new or progressive metastatic disease in the neck.    10/13/2016 Imaging   CT chest showed tiny hypervascular foci in the liver, not definitely seen on prior imaging of 06/16/2016 and 02/28/2016. Abdomen MRI without and with contrast recommended to further evaluate as metastatic disease is a concern. 2. Stable appearance of post treatment changes left upper lung and scattered tiny bilateral pulmonary nodules.    02/11/2017 Imaging   Ct neck: Lymph node mass right posterior neck appears improved from the prior study. Small posterior lymph nodes on the left unchanged. Occluded right jugular vein unchanged.    02/11/2017 Imaging   1. Similar appearance of postsurgical and radiation changes in the left upper lobe. 2. Similar bilateral pulmonary nodules. 3. No thoracic adenopathy. 4. Subtle foci of post-contrast enhancement within the liver are suboptimally characterized on this nondedicated study. Likely similar. These could either be re-evaluated at followup or more entirely characterized with abdominal MRI. 5. Left nephrolithiasis.    05/19/2017 Imaging   Matted lymph node mass right posterior neck appears larger in  the recent CT. Accurate measurements difficult due to infiltrating tumor margins and infiltration of the muscle. Right jugular vein again appears occluded or resected. Small left posterior lymph nodes stable. Left upper lobe airspace density stable and similar to the prior CT    06/03/2017 PET scan   1. Hypermetabolic ill-defined right level IIb lymph node, about 1.3 cm in diameter with maximum SUV 9.5 (formerly 8.1). Appearance suspicious for residual/recurrent malignancy. No worrisome left-sided lesion. 2. Left suprahilar indistinct opacity demonstrates no worrisome hypermetabolic activity. The 5 mm left lower lobe pulmonary nodule is stable and not currently hypermetabolic although below sensitive PET-CT size thresholds. 3. Other imaging findings of potential clinical significance: Bilateral nonobstructive nephrolithiasis. Chronic bilateral maxillary sinusitis.    05/11/2018 PET scan   1. Continued chronic accentuated metabolic activity in the vicinity of right level IIB and the adjacent right  sternocleidomastoid muscle, with ill definition of surrounding tissue planes. Maximum SUV is currently 8.1, formerly 9.5. Accentuated metabolic activity is been present in this vicinity back through 06/25/2015, and there was also some low-level activity in this vicinity on 06/07/2014. Some of this may be from scarring and local muscular activity although clearly a component of residual tumor is difficult to exclude given the focally high activity. 2. Other imaging findings of potential clinical significance: Chronic bilateral maxillary sinusitis. Chronic scarring in the left upper lobe. Chronically stable 5 mm left lower lobe nodule is considered benign. Nonobstructive left nephrolithiasis.    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 COMMENT:No significant  keratinization is identified. Other basaloid carcinomas are in the differential diagnosis. No cell block material is available for further testing.    09/12/2018 Procedure   He underwent fine Needle Aspiration    10/04/2018 PET scan   1. Significant progression of local recurrence laterally in the mid right neck with an enlarging, increasingly hypermetabolic soft tissue mass. This involves the right sternocleidomastoid muscle. 2. Small lymph nodes in the right axilla are increasingly hypermetabolic. These are nonspecific and potentially reactive, although could reflect a small metastases. Small hypermetabolic nodule in the left suprasternal notch is unchanged. 3. No other evidence of metastatic disease.      10/07/2018 - 12/23/2018 Chemotherapy   The patient had cisplatin plus gemzar    12/07/2018 Imaging   1. Decreased size of lateral right neck mass. 2. Unchanged soft tissue nodule in the suprasternal notch. 3. No evidence of new metastatic disease in the neck.      05/30/2019 Imaging   CT neck No clear change or progression compared to the study of March. Overall measurements of the right lateral neck mass are similar, approximately 3 x 1.8 cm. See above discussion. One could argue that there is slight increase in lateral bulging, possibly with an increase in contrast enhancement, towards the inferior margin. This is of questionable validity but could possibly represent some progression or inflammatory change. Other findings in the region are stable.   07/20/2019 - 09/15/2019 Chemotherapy   The patient had dexamethasone (DECADRON) 4 MG tablet, 1 of 1 cycle, Start date: --, End date: -- palonosetron (ALOXI) injection 0.25 mg, 0.25 mg, Intravenous,  Once, 3 of 4 cycles Administration: 0.25 mg (07/20/2019), 0.25 mg (08/10/2019), 0.25 mg (09/08/2019) CISplatin (PLATINOL) 84 mg in sodium chloride 0.9 % 250 mL chemo infusion, 40 mg/m2 = 84 mg (80 % of original dose 50 mg/m2), Intravenous,  Once,  3 of 4 cycles Dose modification: 40 mg/m2 (80 % of original dose 50 mg/m2, Cycle 1, Reason: Dose Not Tolerated) Administration: 84 mg (07/20/2019), 84 mg (08/10/2019), 83 mg (09/08/2019) gemcitabine (GEMZAR) 1,600 mg in sodium chloride 0.9 % 250 mL chemo infusion, 1,672 mg (80 % of original dose 1,000 mg/m2), Intravenous,  Once, 3 of 4 cycles Dose modification: 800 mg/m2 (80 % of original dose 1,000 mg/m2, Cycle 1, Reason: Provider Judgment) Administration: 1,600 mg (07/20/2019), 1,600 mg (07/27/2019), 1,600 mg (08/10/2019), 1,600 mg (08/17/2019), 1,600 mg (09/08/2019), 1,672 mg (09/15/2019) ondansetron (ZOFRAN) 8 mg, dexamethasone (DECADRON) 10 mg in sodium chloride 0.9 % 50 mL IVPB, , Intravenous,  Once, 3 of 4 cycles Administration:  (09/15/2019) fosaprepitant (EMEND) 150 mg, dexamethasone (DECADRON) 12 mg in sodium chloride 0.9 % 145 mL IVPB, , Intravenous,  Once, 3 of  4 cycles Administration:  (07/20/2019),  (08/10/2019),  (09/08/2019)   for chemotherapy treatment.     10/02/2019 Imaging   CT neck As compared to 05/30/2019, no significant interval change in size of an ill-defined mass within the right lateral neck, again measuring 3.3 x 1.8 cm in transaxial dimensions.   Unchanged mildly enlarged left level I lymph node measuring 1.1 cm in short axis.   Unchanged node or nodule at the thoracic inlet, measuring 1.3 x 0.8 cm.   Please refer to concurrently performed chest CT for a description of findings below the level of the thoracic inlet.     10/02/2019 Imaging   CT chest 1. No new or progressive findings in the chest to suggest metastatic disease. 2. Bilateral subcentimeter solid pulmonary nodules are stable since 2018. 3. Hyperdense 1.1 cm anterior liver focus, not clearly visualized on prior studies. Suggest MRI abdomen without and with IV contrast for further characterization.   10/20/2019 - 12/29/2019 Chemotherapy   The patient had ondansetron (ZOFRAN) injection 8 mg, 8 mg (100 %  of original dose 8 mg), Intravenous,  Once, 2 of 5 cycles Dose modification: 8 mg (original dose 8 mg, Cycle 2) Administration: 8 mg (11/17/2019), 8 mg (12/15/2019), 8 mg (12/29/2019) gemcitabine (GEMZAR) 2,000 mg in sodium chloride 0.9 % 250 mL chemo infusion, 2,090 mg, Intravenous,  Once, 3 of 6 cycles Administration: 2,000 mg (10/20/2019), 2,000 mg (11/03/2019), 2,000 mg (11/17/2019), 2,000 mg (12/15/2019), 2,000 mg (12/29/2019)   for chemotherapy treatment.     06/12/2020 Imaging   1. Enlarging superficial, exophytic component of the chronic right sternocleidomastoid muscle mass. See series 6, image 55. 2. Elsewhere stable CT appearance of the Neck.   06/12/2020 Imaging   Post treatment scarring in the left hemithorax, stable. No evidence recurrent or metastatic disease   06/14/2020 - 10/15/2020 Chemotherapy   He received carboplatin, 5FU and Beryle Flock        10/31/2020 Procedure   Interval improvement in right lateral lymph node mass. Improvement in dermal component as well as invasion of the right sternocleidomastoid muscle.   10 mm submental lymph node slightly enlarged compared to the prior study. Continued follow-up recommended.   11/01/2020 -  Chemotherapy   Patient is on Treatment Plan : HEAD/NECK Pembrolizumab Q21D        PHYSICAL EXAMINATION: ECOG PERFORMANCE STATUS: 1 - Symptomatic but completely ambulatory  Vitals:   02/19/22 1241  BP: 128/80  Pulse: 73  Resp: 18  SpO2: 98%   Filed Weights    GENERAL:alert, no distress and comfortable SKIN: skin color, texture, turgor are normal, no rashes or significant lesions EYES: normal, Conjunctiva are pink and non-injected, sclera clear OROPHARYNX:no exudate, no erythema and lips, buccal mucosa, and tongue normal  NECK: Severe neck fibrosis and limitation of neck movement NEURO: alert & oriented x 3 with fluent speech, no focal motor/sensory deficits  LABORATORY DATA:  I have reviewed the data as listed    Component Value  Date/Time   NA 137 01/16/2022 1114   NA 139 09/22/2017 0829   K 3.8 01/16/2022 1114   K 3.5 09/22/2017 0829   CL 102 01/16/2022 1114   CO2 29 01/16/2022 1114   CO2 26 09/22/2017 0829   GLUCOSE 117 (H) 01/16/2022 1114   GLUCOSE 133 09/22/2017 0829   BUN 13 01/16/2022 1114   BUN 14.1 09/22/2017 0829   CREATININE 0.79 01/16/2022 1114   CREATININE 0.9 09/22/2017 0829   CALCIUM 8.9 01/16/2022 1114   CALCIUM 9.1  09/22/2017 0829   PROT 7.0 01/16/2022 1114   PROT 6.8 09/22/2017 0829   ALBUMIN 4.3 01/16/2022 1114   ALBUMIN 4.1 09/22/2017 0829   AST 19 01/16/2022 1114   AST 22 09/22/2017 0829   ALT 20 01/16/2022 1114   ALT 30 09/22/2017 0829   ALKPHOS 49 01/16/2022 1114   ALKPHOS 55 09/22/2017 0829   BILITOT 0.3 01/16/2022 1114   BILITOT 0.35 09/22/2017 0829   GFRNONAA >60 01/16/2022 1114   GFRAA >60 07/05/2020 0845   GFRAA >60 02/06/2019 1215    No results found for: SPEP, UPEP  Lab Results  Component Value Date   WBC 6.7 01/16/2022   NEUTROABS 3.7 01/16/2022   HGB 13.0 01/16/2022   HCT 38.8 (L) 01/16/2022   MCV 88.8 01/16/2022   PLT 190 01/16/2022      Chemistry      Component Value Date/Time   NA 137 01/16/2022 1114   NA 139 09/22/2017 0829   K 3.8 01/16/2022 1114   K 3.5 09/22/2017 0829   CL 102 01/16/2022 1114   CO2 29 01/16/2022 1114   CO2 26 09/22/2017 0829   BUN 13 01/16/2022 1114   BUN 14.1 09/22/2017 0829   CREATININE 0.79 01/16/2022 1114   CREATININE 0.9 09/22/2017 0829      Component Value Date/Time   CALCIUM 8.9 01/16/2022 1114   CALCIUM 9.1 09/22/2017 0829   ALKPHOS 49 01/16/2022 1114   ALKPHOS 55 09/22/2017 0829   AST 19 01/16/2022 1114   AST 22 09/22/2017 0829   ALT 20 01/16/2022 1114   ALT 30 09/22/2017 0829   BILITOT 0.3 01/16/2022 1114   BILITOT 0.35 09/22/2017 0829

## 2022-02-24 ENCOUNTER — Encounter: Payer: Self-pay | Admitting: Rehabilitation

## 2022-02-27 ENCOUNTER — Other Ambulatory Visit: Payer: Self-pay

## 2022-02-27 ENCOUNTER — Inpatient Hospital Stay: Payer: Medicaid Other

## 2022-02-27 ENCOUNTER — Encounter: Payer: Self-pay | Admitting: Hematology and Oncology

## 2022-02-27 ENCOUNTER — Inpatient Hospital Stay (HOSPITAL_BASED_OUTPATIENT_CLINIC_OR_DEPARTMENT_OTHER): Payer: Medicaid Other | Admitting: Hematology and Oncology

## 2022-02-27 DIAGNOSIS — K5909 Other constipation: Secondary | ICD-10-CM | POA: Diagnosis not present

## 2022-02-27 DIAGNOSIS — G893 Neoplasm related pain (acute) (chronic): Secondary | ICD-10-CM

## 2022-02-27 DIAGNOSIS — Z5112 Encounter for antineoplastic immunotherapy: Secondary | ICD-10-CM | POA: Diagnosis not present

## 2022-02-27 DIAGNOSIS — Z7189 Other specified counseling: Secondary | ICD-10-CM

## 2022-02-27 DIAGNOSIS — C760 Malignant neoplasm of head, face and neck: Secondary | ICD-10-CM

## 2022-02-27 DIAGNOSIS — C7800 Secondary malignant neoplasm of unspecified lung: Secondary | ICD-10-CM

## 2022-02-27 DIAGNOSIS — Z95828 Presence of other vascular implants and grafts: Secondary | ICD-10-CM

## 2022-02-27 LAB — TSH: TSH: 1.001 u[IU]/mL (ref 0.350–4.500)

## 2022-02-27 LAB — CBC WITH DIFFERENTIAL (CANCER CENTER ONLY)
Abs Immature Granulocytes: 0.01 10*3/uL (ref 0.00–0.07)
Basophils Absolute: 0.1 10*3/uL (ref 0.0–0.1)
Basophils Relative: 1 %
Eosinophils Absolute: 0.2 10*3/uL (ref 0.0–0.5)
Eosinophils Relative: 4 %
HCT: 38.8 % — ABNORMAL LOW (ref 39.0–52.0)
Hemoglobin: 13.5 g/dL (ref 13.0–17.0)
Immature Granulocytes: 0 %
Lymphocytes Relative: 35 %
Lymphs Abs: 2.1 10*3/uL (ref 0.7–4.0)
MCH: 30.3 pg (ref 26.0–34.0)
MCHC: 34.8 g/dL (ref 30.0–36.0)
MCV: 87 fL (ref 80.0–100.0)
Monocytes Absolute: 0.5 10*3/uL (ref 0.1–1.0)
Monocytes Relative: 9 %
Neutro Abs: 3.1 10*3/uL (ref 1.7–7.7)
Neutrophils Relative %: 51 %
Platelet Count: 234 10*3/uL (ref 150–400)
RBC: 4.46 MIL/uL (ref 4.22–5.81)
RDW: 13.2 % (ref 11.5–15.5)
WBC Count: 6 10*3/uL (ref 4.0–10.5)
nRBC: 0 % (ref 0.0–0.2)

## 2022-02-27 LAB — CMP (CANCER CENTER ONLY)
ALT: 21 U/L (ref 0–44)
AST: 19 U/L (ref 15–41)
Albumin: 4.5 g/dL (ref 3.5–5.0)
Alkaline Phosphatase: 53 U/L (ref 38–126)
Anion gap: 8 (ref 5–15)
BUN: 13 mg/dL (ref 6–20)
CO2: 30 mmol/L (ref 22–32)
Calcium: 9.8 mg/dL (ref 8.9–10.3)
Chloride: 100 mmol/L (ref 98–111)
Creatinine: 0.79 mg/dL (ref 0.61–1.24)
GFR, Estimated: >60 mL/min (ref >60)
Glucose, Bld: 157 mg/dL — ABNORMAL HIGH (ref 70–99)
Potassium: 3.5 mmol/L (ref 3.5–5.1)
Sodium: 138 mmol/L (ref 135–145)
Total Bilirubin: 0.4 mg/dL (ref 0.3–1.2)
Total Protein: 7.1 g/dL (ref 6.5–8.1)

## 2022-02-27 MED ORDER — ANTICOAGULANT SODIUM CITRATE LOCK FLUSH 4% (120 MG/3ML) IV SOLN: 5.0000 mL | PREFILLED_SYRINGE | Freq: Once | INTRAVENOUS | Status: DC

## 2022-02-27 MED ORDER — ONDANSETRON HCL 4 MG/2ML IJ SOLN
8.0000 mg | Freq: Once | INTRAMUSCULAR | Status: AC
  Administered 2022-02-27: 8 mg via INTRAVENOUS
  Filled 2022-02-27: qty 4

## 2022-02-27 MED ORDER — SODIUM CHLORIDE 0.9% FLUSH
10.0000 mL | INTRAVENOUS | Status: DC | PRN
  Administered 2022-02-27: 10 mL

## 2022-02-27 MED ORDER — SODIUM CHLORIDE 0.9% FLUSH
10.0000 mL | Freq: Once | INTRAVENOUS | Status: AC
  Administered 2022-02-27: 10 mL

## 2022-02-27 MED ORDER — SODIUM CHLORIDE 0.9 % IV SOLN: Freq: Once | INTRAVENOUS | Status: AC

## 2022-02-27 MED ORDER — SODIUM CHLORIDE 0.9 % IV SOLN
400.0000 mg | Freq: Once | INTRAVENOUS | Status: AC
  Administered 2022-02-27: 400 mg via INTRAVENOUS
  Filled 2022-02-27: qty 16

## 2022-02-27 NOTE — Progress Notes (Signed)
[place] OFFICE PROGRESS NOTE  Patient Care Team: Patient, No Pcp Per (Inactive) as PCP - General (General Practice) [name], MD as Consulting Physician (Hematology and Oncology) Eppie Gibson, MD as Attending Physician (Radiation Oncology) Jodi Marble, MD as Consulting Physician (Otolaryngology) Philomena Doheny, MD as Referring Physician (Plastic Surgery) Irene Shipper, MD as Consulting Physician (Gastroenterology) System, Provider Not In  ASSESSMENT & PLAN:  Malignant neoplasm of head and neck [facility]) The patient have no signs of recurrent disease in his neck His severe neck pain is related to prior treatment related fibrosis He will continue treatment indefinitely with pembrolizumab  Cancer associated pain His severe neck pain is related to complication from prior surgery and radiation, causing severe fibrosis and limitation of range of motion of his neck His pain also coincide with recent oxycodone taper The patient have difficulties with activities of daily living because of this I recommend increasing the dose of his pain medicine back to somewhere between 5 to 10 mg I do not plan to taper his pain medicine in the future I also recommend trial of long-acting pain medicine with methadone; he felt that this is helping I plan to continue methadone twice daily and oxycodone 5 mg 4 times a day in the future with next refill I reminded him about risk of constipation with both pain medicine  Other constipation He denies recent constipation He will continue aggressive laxative therapy  No orders of the defined types were placed in this encounter.   All questions were answered. The patient knows to call the clinic with any problems, questions or concerns. The total time spent in the appointment was 20 minutes encounter with patients including review of chart and various tests results, discussions about plan of care and coordination of care plan   [name],  MD 02/27/2022 10:19 AM  INTERVAL HISTORY: Please see below for problem oriented charting. he returns for treatment follow-up on maintenance pembrolizumab every 6 weeks for recurrent head and neck cancer Since last time I saw him, his pain control has improved with addition of methadone He denies severe nausea or constipation He has intermittent anxiety but he does not feel that he need anything for that right now  REVIEW OF SYSTEMS:   Constitutional: Denies fevers, chills or abnormal weight loss Eyes: Denies blurriness of vision Ears, nose, mouth, throat, and face: Denies mucositis or sore throat Respiratory: Denies cough, dyspnea or wheezes Cardiovascular: Denies palpitation, chest discomfort or lower extremity swelling Gastrointestinal:  Denies nausea, heartburn or change in bowel habits Skin: Denies abnormal skin rashes Lymphatics: Denies new lymphadenopathy or easy bruising Neurological:Denies numbness, tingling or new weaknesses Behavioral/Psych: Mood is stable, no new changes  All other systems were reviewed with the patient and are negative.  I have reviewed the past medical history, past surgical history, social history and family history with the patient and they are unchanged from previous note.  ALLERGIES:  is allergic to phenergan [promethazine hcl], heparin, and clindamycin.  MEDICATIONS:  Current Outpatient Medications  Medication Sig Dispense Refill   blood glucose meter kit and supplies KIT Dispense based on patient and insurance preference. Use up to four times daily as directed. (FOR ICD-9 250.00, 250.01). 1 each 1   cetirizine (ZYRTEC) 10 MG tablet Take 1 tablet (10 mg total) by mouth daily as needed for allergies. 100 tablet 0   levothyroxine (SYNTHROID) 125 MCG tablet Take 1 tablet (125 mcg total) by mouth daily before breakfast. 60 tablet 1  metFORMIN (GLUCOPHAGE) 500 MG tablet Take 1 tablet (500 mg total) by mouth 2 (two) times daily with a meal. 60 tablet 2    methadone (DOLOPHINE) 10 MG tablet Take 1 tablet (10 mg total) by mouth every 12 (twelve) hours. 30 tablet 0   ondansetron (ZOFRAN) 8 MG tablet Take 1 tablet (8 mg total) by mouth every 8 (eight) hours as needed for nausea or vomiting. 90 tablet 1   oxyCODONE (OXY IR/ROXICODONE) 5 MG immediate release tablet Take 1 tablet (5 mg total) by mouth 3 (three) times daily as needed for severe pain. 90 tablet 0   No current facility-administered medications for this visit.   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 Pan-endoscopy elsewhere showed cancer from nasopharyngeal space.    01/04/2007 - 02/20/2007 Chemotherapy   He received 2 cycles of cisplatin and 5FU followed by concurrent chemo with weekly cisplatin and radiation. He only received 2 doses of chemo due to severe mucositis, nausea and weight loss.    04/05/2007 - 08/04/2007 Chemotherapy   He received 4 more courses of cisplatin with 5FU and had complete response    07/05/2009 Procedure   Fine-needle aspirate of the right level II lymph nodes come from recurrent metastatic disease. Repeat endoscopy and CT scan show no evidence of disease elsewhere.     07/08/2009 - 12/02/2009 Chemotherapy   He was given 6 cycles of carboplatin, 5-FU and docetaxel    12/03/2009 Surgery   He has surgery to the residual lymph node on the right neck which showed no evidence of disease.    02/22/2012 Imaging   Repeat imaging study showed large recurrent mass. He was referred elsewhere for further treatment.    05/03/2012 Surgery   He underwent left upper lobectomy.    04/29/2013 Imaging   PEt scan showed lesion on right level II B and lower lung was abnormal    06/03/2013 - 02/02/2014 Chemotherapy   He had 6 cycles of chemotherapy when he was found to have recurrence of cancer and had received oxaliplatin and capecitabine    06/07/2014 Imaging   PET CT scan showed persistent disease in the right neck lymph nodes and left lung    06/29/2014 Procedure   Accession: [id number] repeat LUL biopsy confirmed metastatic cancer    07/18/2014 - 07/31/2014 Radiation Therapy   He received palliative radiation therapy to the lungs    10/10/2014 Imaging   CT scan of the chest, abdomen and pelvis show regression in the size of the lung nodule in the left upper lobe and stable pulmonary nodules    01/24/2015 Imaging   CT scan showed  stable disease in neck and lung    06/19/2015 Imaging   CT scan of the neck and the chest show possible mild progression of the nodule in the right side of the neck.    06/25/2015 Imaging   PET scan confirmed disease recurrence in the neck    07/07/2015 Imaging   He had [facility] neck [facility]    09/03/2015 - 08/26/2018 Chemotherapy   He received palliative chemo with Nivolumab    10/29/2015 Imaging   PET CT showed positive response to Rx    02/28/2016 Imaging   Ct abdomen showed abnormal thinkening in his stomach    03/03/2016 Imaging   CT: Right sternocleidomastoid muscle metastasis appears less distinct but otherwise not significantly changed in size or configuration since 06/19/2015.2. Left level 3 lymph node which was  hypermetabolic by PET-CT in January 2017 appears slightly smaller    04/01/2016 Imaging   CT cervical spine showed no acute fracture or traumatic malalignment in the cervical spine    04/22/2016 Procedure   Port-a-cath placed.    06/16/2016 Imaging   Ct neck showed right sternocleidomastoid muscle metastasis is further decreased in conspicuity since May, and has mildly decreased in size since September 2016. Continued stability of sub-centimeter left cervical lymph nodes. No new or progressive metastatic disease in the neck.    06/16/2016 Imaging   CT chest showed stable masslike radiation fibrosis in the left upper lobe. Stable subcentimeter pulmonary nodules in the bilateral lower lobes. No new or progressive metastatic disease in the chest. Nonobstructing left renal stone.    10/13/2016 Imaging   Ct neck showed unchanged right sternocleidomastoid muscle metastasis. Unchanged subcentimeter left cervical lymph nodes. No evidence of new or progressive metastatic disease in the neck.    10/13/2016 Imaging   CT chest showed tiny hypervascular foci in the liver, not definitely seen on prior imaging of 06/16/2016 and 02/28/2016. Abdomen MRI without and with contrast recommended to further evaluate as metastatic disease is a concern. 2. Stable appearance of post treatment changes left upper lung and scattered tiny bilateral pulmonary nodules.    02/11/2017 Imaging   Ct neck: Lymph node mass right posterior neck appears improved from the prior study. Small posterior lymph nodes on the left unchanged. Occluded right jugular vein unchanged.    02/11/2017 Imaging   1. Similar appearance of postsurgical and radiation changes in the left upper lobe. 2. Similar bilateral pulmonary nodules. 3. No thoracic adenopathy. 4. Subtle foci of post-contrast enhancement within the liver are suboptimally characterized on this nondedicated study. Likely similar. These could either be re-evaluated at followup or  more entirely characterized with abdominal MRI. 5. Left nephrolithiasis.    05/19/2017 Imaging   Matted lymph node mass right posterior neck appears larger in the recent CT. Accurate measurements difficult due to infiltrating tumor margins and infiltration of the muscle. Right jugular vein again appears occluded or resected. Small left posterior lymph nodes stable. Left upper lobe airspace density stable and similar to the prior CT    06/03/2017 PET scan   1. Hypermetabolic ill-defined right level IIb lymph node, about 1.3 cm in diameter with maximum SUV 9.5 (formerly 8.1). Appearance suspicious for residual/recurrent malignancy. No worrisome left-sided lesion. 2. Left suprahilar indistinct opacity demonstrates no worrisome hypermetabolic activity. The 5 mm left lower lobe pulmonary nodule is stable and not currently hypermetabolic although below sensitive PET-CT size thresholds. 3. Other imaging findings of potential clinical significance: Bilateral nonobstructive nephrolithiasis. Chronic bilateral maxillary sinusitis.  05/11/2018 PET scan   1. Continued chronic accentuated metabolic activity in the vicinity of right level IIB and the adjacent right sternocleidomastoid muscle, with ill definition of surrounding tissue planes. Maximum SUV is currently 8.1, formerly 9.5. Accentuated metabolic activity is been present in this vicinity back through 06/25/2015, and there was also some low-level activity in this vicinity on 06/07/2014. Some of this may be from scarring and local muscular activity although clearly a component of residual tumor is difficult to exclude given the focally high activity. 2. Other imaging findings of potential clinical significance: Chronic bilateral maxillary sinusitis. Chronic scarring in the left upper lobe. Chronically stable 5 mm left lower lobe nodule is considered benign. Nonobstructive left nephrolithiasis.    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 COMMENT:No significant keratinization is identified. Other basaloid carcinomas are in the differential diagnosis. No cell block material is available for further testing.    09/12/2018 Procedure   He underwent fine Needle Aspiration    10/04/2018 PET scan   1. Significant progression of local recurrence laterally in the mid right neck with an enlarging, increasingly hypermetabolic soft tissue mass. This involves the right sternocleidomastoid muscle. 2. Small lymph nodes in the right axilla are increasingly hypermetabolic. These are nonspecific and potentially reactive, although could reflect a small metastases. Small hypermetabolic nodule in the left suprasternal notch is unchanged. 3. No other evidence of metastatic disease.      10/07/2018 - 12/23/2018 Chemotherapy   The patient had cisplatin plus gemzar    12/07/2018 Imaging   1. Decreased size of lateral right neck mass. 2. Unchanged soft tissue nodule in the suprasternal notch. 3. No evidence of new metastatic disease in the neck.      05/30/2019 Imaging   CT neck No clear change or progression compared to the study of March. Overall measurements of the right lateral neck mass are similar, approximately 3 x 1.8 cm. See above discussion. One could argue that there is slight increase in lateral bulging, possibly with an increase in contrast enhancement, towards the inferior margin. This is of questionable validity but could possibly represent some progression or inflammatory change. Other findings in the region are stable.   07/20/2019 - 09/15/2019 Chemotherapy   The patient had dexamethasone (DECADRON) 4 MG tablet, 1 of 1 cycle, Start date: --, End date: -- palonosetron (ALOXI) injection 0.25 mg, 0.25 mg, Intravenous,  Once, 3 of 4 cycles Administration: 0.25 mg (07/20/2019), 0.25 mg  (08/10/2019), 0.25 mg (09/08/2019) CISplatin (PLATINOL) 84 mg in sodium chloride 0.9 % 250 mL chemo infusion, 40 mg/m2 = 84 mg (80 % of original dose 50 mg/m2), Intravenous,  Once, 3 of 4 cycles Dose modification: 40 mg/m2 (80 % of original dose 50 mg/m2, Cycle 1, Reason: Dose Not Tolerated) Administration: 84 mg (07/20/2019), 84 mg (08/10/2019), 83 mg (09/08/2019) gemcitabine (GEMZAR) 1,600 mg in sodium chloride 0.9 % 250 mL chemo infusion, 1,672 mg (80 % of original dose 1,000 mg/m2), Intravenous,  Once, 3 of 4 cycles Dose modification: 800 mg/m2 (80 % of original dose 1,000 mg/m2, Cycle 1, Reason: Provider Judgment) Administration: 1,600 mg (07/20/2019), 1,600 mg (07/27/2019), 1,600 mg (08/10/2019), 1,600 mg (08/17/2019), 1,600 mg (09/08/2019), 1,672 mg (09/15/2019) ondansetron (ZOFRAN) 8 mg, dexamethasone (DECADRON) 10 mg in sodium chloride 0.9 % 50 mL IVPB, , Intravenous,  Once, 3 of 4 cycles Administration:  (09/15/2019)  fosaprepitant (EMEND) 150 mg, dexamethasone (DECADRON) 12 mg in sodium chloride 0.9 % 145 mL IVPB, , Intravenous,  Once, 3 of 4 cycles Administration:  (07/20/2019),  (08/10/2019),  (09/08/2019)   for chemotherapy treatment.     10/02/2019 Imaging   CT neck As compared to 05/30/2019, no significant interval change in size of an ill-defined mass within the right lateral neck, again measuring 3.3 x 1.8 cm in transaxial dimensions.   Unchanged mildly enlarged left level I lymph node measuring 1.1 cm in short axis.   Unchanged node or nodule at the thoracic inlet, measuring 1.3 x 0.8 cm.   Please refer to concurrently performed chest CT for a description of findings below the level of the thoracic inlet.     10/02/2019 Imaging   CT chest 1. No new or progressive findings in the chest to suggest metastatic disease. 2. Bilateral subcentimeter solid pulmonary nodules are stable since 2018. 3. Hyperdense 1.1 cm anterior liver focus, not clearly visualized on prior studies.  Suggest MRI abdomen without and with IV contrast for further characterization.   10/20/2019 - 12/29/2019 Chemotherapy   The patient had ondansetron (ZOFRAN) injection 8 mg, 8 mg (100 % of original dose 8 mg), Intravenous,  Once, 2 of 5 cycles Dose modification: 8 mg (original dose 8 mg, Cycle 2) Administration: 8 mg (11/17/2019), 8 mg (12/15/2019), 8 mg (12/29/2019) gemcitabine (GEMZAR) 2,000 mg in sodium chloride 0.9 % 250 mL chemo infusion, 2,090 mg, Intravenous,  Once, 3 of 6 cycles Administration: 2,000 mg (10/20/2019), 2,000 mg (11/03/2019), 2,000 mg (11/17/2019), 2,000 mg (12/15/2019), 2,000 mg (12/29/2019)   for chemotherapy treatment.     06/12/2020 Imaging   1. Enlarging superficial, exophytic component of the chronic right sternocleidomastoid muscle mass. See series 6, image 55. 2. Elsewhere stable CT appearance of the Neck.   06/12/2020 Imaging   Post treatment scarring in the left hemithorax, stable. No evidence recurrent or metastatic disease   06/14/2020 - 10/15/2020 Chemotherapy   He received carboplatin, 5FU and Beryle Flock        10/31/2020 Procedure   Interval improvement in right lateral lymph node mass. Improvement in dermal component as well as invasion of the right sternocleidomastoid muscle.   10 mm submental lymph node slightly enlarged compared to the prior study. Continued follow-up recommended.   11/01/2020 -  Chemotherapy   Patient is on Treatment Plan : HEAD/NECK Pembrolizumab Q21D        PHYSICAL EXAMINATION: ECOG PERFORMANCE STATUS: 1 - Symptomatic but completely ambulatory  Vitals:   02/27/22 1014  BP: (!) 146/95  Pulse: 91  Resp: 18  Temp: 98.5 F (36.9 C)  SpO2: 100%   Filed Weights   02/27/22 1014  Weight: 190 lb 9.6 oz (86.5 kg)    GENERAL:alert, no distress and comfortable NEURO: alert & oriented x 3 with fluent speech, no focal motor/sensory deficits  LABORATORY DATA:  I have reviewed the data as listed    Component Value Date/Time   NA  137 01/16/2022 1114   NA 139 09/22/2017 0829   K 3.8 01/16/2022 1114   K 3.5 09/22/2017 0829   CL 102 01/16/2022 1114   CO2 29 01/16/2022 1114   CO2 26 09/22/2017 0829   GLUCOSE 117 (H) 01/16/2022 1114   GLUCOSE 133 09/22/2017 0829   BUN 13 01/16/2022 1114   BUN 14.1 09/22/2017 0829   CREATININE 0.79 01/16/2022 1114   CREATININE 0.9 09/22/2017 0829   CALCIUM 8.9 01/16/2022 1114   CALCIUM 9.1 09/22/2017  0829   PROT 7.0 01/16/2022 1114   PROT 6.8 09/22/2017 0829   ALBUMIN 4.3 01/16/2022 1114   ALBUMIN 4.1 09/22/2017 0829   AST 19 01/16/2022 1114   AST 22 09/22/2017 0829   ALT 20 01/16/2022 1114   ALT 30 09/22/2017 0829   ALKPHOS 49 01/16/2022 1114   ALKPHOS 55 09/22/2017 0829   BILITOT 0.3 01/16/2022 1114   BILITOT 0.35 09/22/2017 0829   GFRNONAA >60 01/16/2022 1114   GFRAA >60 07/05/2020 0845   GFRAA >60 02/06/2019 1215    No results found for: SPEP, UPEP  Lab Results  Component Value Date   WBC 6.0 02/27/2022   NEUTROABS 3.1 02/27/2022   HGB 13.5 02/27/2022   HCT 38.8 (L) 02/27/2022   MCV 87.0 02/27/2022   PLT 234 02/27/2022      Chemistry      Component Value Date/Time   NA 137 01/16/2022 1114   NA 139 09/22/2017 0829   K 3.8 01/16/2022 1114   K 3.5 09/22/2017 0829   CL 102 01/16/2022 1114   CO2 29 01/16/2022 1114   CO2 26 09/22/2017 0829   BUN 13 01/16/2022 1114   BUN 14.1 09/22/2017 0829   CREATININE 0.79 01/16/2022 1114   CREATININE 0.9 09/22/2017 0829      Component Value Date/Time   CALCIUM 8.9 01/16/2022 1114   CALCIUM 9.1 09/22/2017 0829   ALKPHOS 49 01/16/2022 1114   ALKPHOS 55 09/22/2017 0829   AST 19 01/16/2022 1114   AST 22 09/22/2017 0829   ALT 20 01/16/2022 1114   ALT 30 09/22/2017 0829   BILITOT 0.3 01/16/2022 1114   BILITOT 0.35 09/22/2017 0829

## 2022-02-27 NOTE — Patient Instructions (Signed)
[facility]  Discharge Instructions: Thank you for choosing [facility] to provide your oncology and hematology care.   If you have a lab appointment with the [facility], please go directly to the [facility] and check in at the registration area.   Wear comfortable clothing and clothing appropriate for easy access to any Portacath or PICC line.   We strive to give you quality time with your provider. You may need to reschedule your appointment if you arrive late (15 or more minutes).  Arriving late affects you and other patients whose appointments are after yours.  Also, if you miss three or more appointments without notifying the office, you may be dismissed from the clinic at the provider's discretion.      For prescription refill requests, have your pharmacy contact our office and allow 72 hours for refills to be completed.    Today you received the following chemotherapy and/or immunotherapy agents: Pembrolizumab.       To help prevent nausea and vomiting after your treatment, we encourage you to take your nausea medication as directed.  BELOW ARE SYMPTOMS THAT SHOULD BE REPORTED IMMEDIATELY: *FEVER GREATER THAN 100.4 F (38 C) OR HIGHER *CHILLS OR SWEATING *NAUSEA AND VOMITING THAT IS NOT CONTROLLED WITH YOUR NAUSEA MEDICATION *UNUSUAL SHORTNESS OF BREATH *UNUSUAL BRUISING OR BLEEDING *URINARY PROBLEMS (pain or burning when urinating, or frequent urination) *BOWEL PROBLEMS (unusual diarrhea, constipation, pain near the anus) TENDERNESS IN MOUTH AND THROAT WITH OR WITHOUT PRESENCE OF ULCERS (sore throat, sores in mouth, or a toothache) UNUSUAL RASH, SWELLING OR PAIN  UNUSUAL VAGINAL DISCHARGE OR ITCHING   Items with * indicate a potential emergency and should be followed up as soon as possible or go to the Emergency Department if any problems should occur.  Please show the CHEMOTHERAPY ALERT CARD or IMMUNOTHERAPY ALERT CARD at  check-in to the Emergency Department and triage nurse.  Should you have questions after your visit or need to cancel or reschedule your appointment, please contact [facility]  Dept: [phone]  and follow the prompts.  Office hours are 8:00 a.m. to 4:30 p.m. Monday - Friday. Please note that voicemails left after 4:00 p.m. may not be returned until the following business day.  We are closed weekends and major holidays. You have access to a nurse at all times for urgent questions. Please call the main number to the clinic Dept: [phone] and follow the prompts.   For any non-urgent questions, you may also contact your provider using MyChart. We now offer e-Visits for anyone 11 and older to request care online for non-urgent symptoms. For details visit [email or web address].   Also download the MyChart app! Go to the app store, search "MyChart", open the app, select [facility], and log in with your MyChart username and password.  Due to Covid, a mask is required upon entering the hospital/clinic. If you do not have a mask, one will be given to you upon arrival. For doctor visits, patients may have 1 support person aged 61 or older with them. For treatment visits, patients cannot have anyone with them due to current Covid guidelines and our immunocompromised population.

## 2022-02-27 NOTE — Assessment & Plan Note (Signed)
His severe neck pain is related to complication from prior surgery and radiation, causing severe fibrosis and limitation of range of motion of his neck His pain also coincide with recent oxycodone taper The patient have difficulties with activities of daily living because of this I recommend increasing the dose of his pain medicine back to somewhere between 5 to 10 mg I do not plan to taper his pain medicine in the future I also recommend trial of long-acting pain medicine with methadone; he felt that this is helping I plan to continue methadone twice daily and oxycodone 5 mg 4 times a day in the future with next refill I reminded him about risk of constipation with both pain medicine

## 2022-02-27 NOTE — Assessment & Plan Note (Signed)
He denies recent constipation He will continue aggressive laxative therapy

## 2022-02-27 NOTE — Assessment & Plan Note (Signed)
The patient have no signs of recurrent disease in his neck His severe neck pain is related to prior treatment related fibrosis He will continue treatment indefinitely with pembrolizumab

## 2022-03-03 ENCOUNTER — Encounter: Payer: Self-pay | Admitting: Rehabilitation

## 2022-03-05 ENCOUNTER — Other Ambulatory Visit (HOSPITAL_BASED_OUTPATIENT_CLINIC_OR_DEPARTMENT_OTHER): Payer: Self-pay

## 2022-03-05 ENCOUNTER — Telehealth: Payer: Self-pay

## 2022-03-05 ENCOUNTER — Other Ambulatory Visit: Payer: Self-pay | Admitting: Hematology and Oncology

## 2022-03-05 ENCOUNTER — Encounter: Payer: Self-pay | Admitting: Hematology and Oncology

## 2022-03-05 MED ORDER — METHADONE HCL 10 MG PO TABS
10.0000 mg | ORAL_TABLET | Freq: Two times a day (BID) | ORAL | 0 refills | Status: DC
  Filled 2022-03-05: qty 30, 15d supply, fill #0

## 2022-03-05 MED ORDER — OXYCODONE HCL 5 MG PO TABS
5.0000 mg | ORAL_TABLET | Freq: Four times a day (QID) | ORAL | 0 refills | Status: DC | PRN
  Filled 2022-03-05: qty 90, 23d supply, fill #0

## 2022-03-05 NOTE — Telephone Encounter (Signed)
Notified Patient of prior authorization approval for Methadone HCL 10mg  tablets. Medication is approved through 06/03/2022. No other needs or concerns voiced at this time.

## 2022-03-05 NOTE — Telephone Encounter (Signed)
He called back. The pharmacy is filling Rx now.

## 2022-03-05 NOTE — Telephone Encounter (Signed)
He called and left a message. Requesting a refill on Methadone and Oxycodone. He said the Methadone has really helped with his pain.  He said to let you know that he is your favorite patient and he appreciates you.

## 2022-03-05 NOTE — Telephone Encounter (Signed)
Sent to [facility]

## 2022-03-17 ENCOUNTER — Other Ambulatory Visit (HOSPITAL_BASED_OUTPATIENT_CLINIC_OR_DEPARTMENT_OTHER): Payer: Self-pay

## 2022-03-25 ENCOUNTER — Telehealth: Payer: Self-pay

## 2022-03-25 NOTE — Telephone Encounter (Signed)
He called and left a message requesting Oxycodone refill. He will need a refill tomorrow.

## 2022-03-26 ENCOUNTER — Telehealth: Payer: Self-pay

## 2022-03-26 ENCOUNTER — Other Ambulatory Visit (HOSPITAL_BASED_OUTPATIENT_CLINIC_OR_DEPARTMENT_OTHER): Payer: Self-pay

## 2022-03-26 ENCOUNTER — Other Ambulatory Visit: Payer: Self-pay | Admitting: Hematology and Oncology

## 2022-03-26 MED ORDER — OXYCODONE HCL 5 MG PO TABS
5.0000 mg | ORAL_TABLET | Freq: Four times a day (QID) | ORAL | 0 refills | Status: DC | PRN
  Filled 2022-03-26: qty 90, 23d supply, fill #0

## 2022-03-26 NOTE — Telephone Encounter (Signed)
Notified Patient and Patient's Pharmacy of choice of prior authorization approval for Oxycodone HCL 5mg  Tablets. Medication is approved through 09/22/2022. No other needs or concerns voiced at this time.

## 2022-03-27 ENCOUNTER — Other Ambulatory Visit (HOSPITAL_BASED_OUTPATIENT_CLINIC_OR_DEPARTMENT_OTHER): Payer: Self-pay

## 2022-04-10 ENCOUNTER — Encounter: Payer: Self-pay | Admitting: Hematology and Oncology

## 2022-04-10 ENCOUNTER — Inpatient Hospital Stay: Payer: Medicaid Other | Admitting: Hematology and Oncology

## 2022-04-10 ENCOUNTER — Inpatient Hospital Stay: Payer: Medicaid Other

## 2022-04-10 ENCOUNTER — Other Ambulatory Visit: Payer: Self-pay

## 2022-04-10 ENCOUNTER — Inpatient Hospital Stay: Payer: Medicaid Other | Attending: Hematology and Oncology

## 2022-04-10 DIAGNOSIS — G893 Neoplasm related pain (acute) (chronic): Secondary | ICD-10-CM

## 2022-04-10 DIAGNOSIS — Z95828 Presence of other vascular implants and grafts: Secondary | ICD-10-CM

## 2022-04-10 DIAGNOSIS — C760 Malignant neoplasm of head, face and neck: Secondary | ICD-10-CM

## 2022-04-10 DIAGNOSIS — Z5112 Encounter for antineoplastic immunotherapy: Secondary | ICD-10-CM | POA: Insufficient documentation

## 2022-04-10 DIAGNOSIS — Z7189 Other specified counseling: Secondary | ICD-10-CM

## 2022-04-10 DIAGNOSIS — Z79899 Other long term (current) drug therapy: Secondary | ICD-10-CM | POA: Insufficient documentation

## 2022-04-10 DIAGNOSIS — C7800 Secondary malignant neoplasm of unspecified lung: Secondary | ICD-10-CM

## 2022-04-10 DIAGNOSIS — E039 Hypothyroidism, unspecified: Secondary | ICD-10-CM

## 2022-04-10 LAB — CBC WITH DIFFERENTIAL (CANCER CENTER ONLY)
Abs Immature Granulocytes: 0.01 10*3/uL (ref 0.00–0.07)
Basophils Absolute: 0 10*3/uL (ref 0.0–0.1)
Basophils Relative: 1 %
Eosinophils Absolute: 0.2 10*3/uL (ref 0.0–0.5)
Eosinophils Relative: 4 %
HCT: 40 % (ref 39.0–52.0)
Hemoglobin: 13.8 g/dL (ref 13.0–17.0)
Immature Granulocytes: 0 %
Lymphocytes Relative: 38 %
Lymphs Abs: 1.8 10*3/uL (ref 0.7–4.0)
MCH: 29.9 pg (ref 26.0–34.0)
MCHC: 34.5 g/dL (ref 30.0–36.0)
MCV: 86.6 fL (ref 80.0–100.0)
Monocytes Absolute: 0.4 10*3/uL (ref 0.1–1.0)
Monocytes Relative: 8 %
Neutro Abs: 2.3 10*3/uL (ref 1.7–7.7)
Neutrophils Relative %: 49 %
Platelet Count: 259 10*3/uL (ref 150–400)
RBC: 4.62 MIL/uL (ref 4.22–5.81)
RDW: 13 % (ref 11.5–15.5)
WBC Count: 4.7 10*3/uL (ref 4.0–10.5)
nRBC: 0 % (ref 0.0–0.2)

## 2022-04-10 LAB — CMP (CANCER CENTER ONLY)
ALT: 22 U/L (ref 0–44)
AST: 20 U/L (ref 15–41)
Albumin: 4.5 g/dL (ref 3.5–5.0)
Alkaline Phosphatase: 51 U/L (ref 38–126)
Anion gap: 7 (ref 5–15)
BUN: 16 mg/dL (ref 6–20)
CO2: 29 mmol/L (ref 22–32)
Calcium: 9.7 mg/dL (ref 8.9–10.3)
Chloride: 104 mmol/L (ref 98–111)
Creatinine: 0.82 mg/dL (ref 0.61–1.24)
GFR, Estimated: >60 mL/min (ref >60)
Glucose, Bld: 127 mg/dL — ABNORMAL HIGH (ref 70–99)
Potassium: 3.7 mmol/L (ref 3.5–5.1)
Sodium: 140 mmol/L (ref 135–145)
Total Bilirubin: 0.3 mg/dL (ref 0.3–1.2)
Total Protein: 7.3 g/dL (ref 6.5–8.1)

## 2022-04-10 LAB — TSH: TSH: 0.541 u[IU]/mL (ref 0.350–4.500)

## 2022-04-10 MED ORDER — SODIUM CHLORIDE 0.9 % IV SOLN: Freq: Once | INTRAVENOUS | Status: AC

## 2022-04-10 MED ORDER — ONDANSETRON HCL 4 MG/2ML IJ SOLN
8.0000 mg | Freq: Once | INTRAMUSCULAR | Status: AC
  Administered 2022-04-10: 8 mg via INTRAVENOUS
  Filled 2022-04-10: qty 4

## 2022-04-10 MED ORDER — SODIUM CHLORIDE 0.9% FLUSH
10.0000 mL | INTRAVENOUS | Status: DC | PRN
  Administered 2022-04-10: 10 mL

## 2022-04-10 MED ORDER — SODIUM CHLORIDE 0.9 % IV SOLN
400.0000 mg | Freq: Once | INTRAVENOUS | Status: AC
  Administered 2022-04-10: 400 mg via INTRAVENOUS
  Filled 2022-04-10: qty 16

## 2022-04-10 MED ORDER — SODIUM CHLORIDE 0.9% FLUSH
10.0000 mL | Freq: Once | INTRAVENOUS | Status: AC
  Administered 2022-04-10: 10 mL

## 2022-04-10 NOTE — Assessment & Plan Note (Signed)
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

## 2022-04-10 NOTE — Progress Notes (Signed)
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 He will continue treatment indefinitely with pembrolizumab I plan to repeat imaging study end of the year  Acquired hypothyroidism I reminded the patient importance of taking Synthroid as directed We will adjust the dose of his medicine as needed  Cancer associated pain His severe neck pain is related to complication from prior surgery and radiation, causing severe fibrosis and limitation of range of motion of his neck I plan to continue methadone twice daily and oxycodone 5 mg 3 times a day in the future  I reminded him about risk of constipation with both pain medicine  No orders of the defined types were placed in this encounter.   All questions were answered. The patient knows to call the clinic with any problems, questions or concerns. The total time spent in the appointment was 20 minutes encounter with patients including review of chart and various tests results, discussions about plan of care and coordination of care plan   [name], MD 04/10/2022 12:00 PM  INTERVAL HISTORY: Please see below for problem oriented charting. he returns for treatment follow-up on maintenance pembrolizumab every 6 weeks for recurrent head and neck cancer He is doing well No side effects from treatment His pain is well controlled  REVIEW OF SYSTEMS:    Constitutional: Denies fevers, chills or abnormal weight loss Eyes: Denies blurriness of vision Ears, nose, mouth, throat, and face: Denies mucositis or sore throat Respiratory: Denies cough, dyspnea or wheezes Cardiovascular: Denies palpitation, chest discomfort or lower extremity swelling Gastrointestinal:  Denies nausea, heartburn or change in bowel habits Skin: Denies abnormal skin rashes Lymphatics: Denies new lymphadenopathy or easy bruising Neurological:Denies numbness, tingling or new weaknesses Behavioral/Psych: Mood is stable, no new changes  All other systems were reviewed with the patient and are negative.  I have reviewed the past medical history, past surgical history, social history and family history with the patient and they are unchanged from previous note.  ALLERGIES:  is allergic to phenergan [promethazine hcl], heparin, and clindamycin.  MEDICATIONS:  Current Outpatient Medications  Medication Sig Dispense Refill   blood glucose meter kit and supplies KIT Dispense based on patient and insurance preference. Use up to four times daily as directed. (FOR ICD-9 250.00, 250.01). 1 each 1   cetirizine (ZYRTEC) 10 MG tablet Take 1 tablet (10 mg total) by mouth daily as needed for allergies. 100 tablet 0   levothyroxine (SYNTHROID) 125 MCG tablet Take 1 tablet (125 mcg total) by mouth daily before breakfast. 60 tablet 1   metFORMIN (GLUCOPHAGE) 500 MG tablet Take 1 tablet (500 mg total) by mouth 2 (two) times daily with a meal. 60 tablet 2   methadone (DOLOPHINE) 10 MG tablet Take 1 tablet (10 mg total) by mouth every 12 (twelve) hours. 30 tablet 0   ondansetron (ZOFRAN) 8 MG tablet Take 1 tablet (8 mg total) by mouth every 8 (eight) hours as needed for nausea or vomiting. [place]  tablet 1   oxyCODONE (OXY IR/ROXICODONE) 5 MG immediate release tablet Take 1 tablet (5 mg total) by mouth every 6 (six) hours as needed for severe pain. 90 tablet 0   No current facility-administered  medications for this visit.   Facility-Administered Medications Ordered in Other Visits  Medication Dose Route Frequency Provider Last Rate Last Admin   anticoagulant sodium citrate solution 5 mL  5 mL Intracatheter Once Alvy Bimler, Ravyn Nikkel, MD       anticoagulant sodium citrate solution 5 mL  5 mL Intracatheter Once Alvy Bimler, Rumor Sun, MD       pembrolizumab (KEYTRUDA) 400 mg in sodium chloride 0.9 % 50 mL chemo infusion  400 mg Intravenous Once Marlisa Caridi, MD       sodium chloride flush (NS) 0.9 % injection 10 mL  10 mL Intracatheter PRN [name], MD        SUMMARY OF ONCOLOGIC HISTORY: Oncology History Overview Note  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 Pan-endoscopy elsewhere showed cancer from nasopharyngeal space.   01/04/2007 - 02/20/2007 Chemotherapy   He received 2 cycles of cisplatin and 5FU followed by concurrent chemo with weekly cisplatin and radiation. He only received 2 doses of chemo due to severe mucositis, nausea and weight loss.   04/05/2007 - 08/04/2007 Chemotherapy   He received 4 more courses of cisplatin with 5FU and had complete response   07/05/2009 Procedure   Fine-needle aspirate of the right level II lymph nodes come from recurrent metastatic disease. Repeat endoscopy and CT scan show no evidence of disease elsewhere.   07/08/2009 - 12/02/2009 Chemotherapy   He was given 6 cycles of carboplatin, 5-FU and docetaxel   12/03/2009 Surgery   He has surgery to the residual lymph node on the right neck which showed no evidence of disease.   02/22/2012  Imaging   Repeat imaging study showed large recurrent mass. He was referred elsewhere for further treatment.   05/03/2012 Surgery   He underwent left upper lobectomy.   04/29/2013 Imaging   PEt scan showed lesion on right level II B and lower lung was abnormal   06/03/2013 - 02/02/2014 Chemotherapy   He had 6 cycles of chemotherapy when he was found to have recurrence of cancer and had received oxaliplatin and capecitabine   06/07/2014 Imaging   PET CT scan showed persistent disease in the right neck lymph nodes and left lung   06/29/2014 Procedure   Accession: [id number] repeat LUL biopsy confirmed metastatic cancer   07/18/2014 - 07/31/2014 Radiation Therapy   He received palliative radiation therapy to the lungs   10/10/2014 Imaging   CT scan of the chest, abdomen and pelvis show regression in the size of the lung nodule in the left upper lobe and stable pulmonary nodules   01/24/2015 Imaging   CT scan showed stable disease in neck and lung   06/19/2015 Imaging   CT scan of the neck and the chest show possible mild progression of the nodule in the right side of the neck.  06/25/2015 Imaging   PET scan confirmed disease recurrence in the neck   07/07/2015 Imaging   He had [facility] neck [facility]   09/03/2015 - 08/26/2018 Chemotherapy   He received palliative chemo with Nivolumab   10/29/2015 Imaging   PET CT showed positive response to Rx   02/28/2016 Imaging   Ct abdomen showed abnormal thinkening in his stomach   03/03/2016 Imaging   CT: Right sternocleidomastoid muscle metastasis appears less distinct but otherwise not significantly changed in size or configuration since 06/19/2015.2. Left level 3 lymph node which was hypermetabolic by PET-CT in January 2017 appears slightly smaller   04/01/2016 Imaging   CT cervical spine showed no acute fracture or traumatic malalignment in the cervical spine   04/22/2016 Procedure   Port-a-cath placed.   06/16/2016 Imaging   Ct neck showed right  sternocleidomastoid muscle metastasis is further decreased in conspicuity since May, and has mildly decreased in size since September 2016. Continued stability of sub-centimeter left cervical lymph nodes. No new or progressive metastatic disease in the neck.   06/16/2016 Imaging   CT chest showed stable masslike radiation fibrosis in the left upper lobe. Stable subcentimeter pulmonary nodules in the bilateral lower lobes. No new or progressive metastatic disease in the chest. Nonobstructing left renal stone.   10/13/2016 Imaging   Ct neck showed unchanged right sternocleidomastoid muscle metastasis. Unchanged subcentimeter left cervical lymph nodes. No evidence of new or progressive metastatic disease in the neck.   10/13/2016 Imaging   CT chest showed tiny hypervascular foci in the liver, not definitely seen on prior imaging of 06/16/2016 and 02/28/2016. Abdomen MRI without and with contrast recommended to further evaluate as metastatic disease is a concern. 2. Stable appearance of post treatment changes left upper lung and scattered tiny bilateral pulmonary nodules.   02/11/2017 Imaging   Ct neck: Lymph node mass right posterior neck appears improved from the prior study. Small posterior lymph nodes on the left unchanged. Occluded right jugular vein unchanged.   02/11/2017 Imaging   1. Similar appearance of postsurgical and radiation changes in the left upper lobe. 2. Similar bilateral pulmonary nodules. 3. No thoracic adenopathy. 4. Subtle foci of post-contrast enhancement within the liver are suboptimally characterized on this nondedicated study. Likely similar. These could either be re-evaluated at followup or more entirely characterized with abdominal MRI. 5. Left nephrolithiasis.   05/19/2017 Imaging   Matted lymph node mass right posterior neck appears larger in the recent CT. Accurate measurements difficult due to infiltrating tumor margins and infiltration of the muscle. Right jugular vein  again appears occluded or resected. Small left posterior lymph nodes stable. Left upper lobe airspace density stable and similar to the prior CT   06/03/2017 PET scan   1. Hypermetabolic ill-defined right level IIb lymph node, about 1.3 cm in diameter with maximum SUV 9.5 (formerly 8.1). Appearance suspicious for residual/recurrent malignancy. No worrisome left-sided lesion. 2. Left suprahilar indistinct opacity demonstrates no worrisome hypermetabolic activity. The 5 mm left lower lobe pulmonary nodule is stable and not currently hypermetabolic although below sensitive PET-CT size thresholds. 3. Other imaging findings of potential clinical significance: Bilateral nonobstructive nephrolithiasis. Chronic bilateral maxillary sinusitis.   05/11/2018 PET scan   1. Continued chronic accentuated metabolic activity in the vicinity of right level IIB and the adjacent right sternocleidomastoid muscle, with ill definition of surrounding tissue planes. Maximum SUV is currently 8.1, formerly 9.5. Accentuated metabolic activity is been present in this vicinity back through 06/25/2015, and there was also  some low-level activity in this vicinity on 06/07/2014. Some of this may be from scarring and local muscular activity although clearly a component of residual tumor is difficult to exclude given the focally high activity. 2. Other imaging findings of potential clinical significance: Chronic bilateral maxillary sinusitis. Chronic scarring in the left upper lobe. Chronically stable 5 mm left lower lobe nodule is considered benign. Nonobstructive left nephrolithiasis.   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 COMMENT:No significant keratinization is identified. Other basaloid carcinomas are in the differential diagnosis. No cell block material is available for further  testing.   09/12/2018 Procedure   He underwent fine Needle Aspiration   10/04/2018 PET scan   1. Significant progression of local recurrence laterally in the mid right neck with an enlarging, increasingly hypermetabolic soft tissue mass. This involves the right sternocleidomastoid muscle. 2. Small lymph nodes in the right axilla are increasingly hypermetabolic. These are nonspecific and potentially reactive, although could reflect a small metastases. Small hypermetabolic nodule in the left suprasternal notch is unchanged. 3. No other evidence of metastatic disease.     10/07/2018 - 12/23/2018 Chemotherapy   The patient had cisplatin plus gemzar   12/07/2018 Imaging   1. Decreased size of lateral right neck mass. 2. Unchanged soft tissue nodule in the suprasternal notch. 3. No evidence of new metastatic disease in the neck.     05/30/2019 Imaging   CT neck No clear change or progression compared to the study of March. Overall measurements of the right lateral neck mass are similar, approximately 3 x 1.8 cm. See above discussion. One could argue that there is slight increase in lateral bulging, possibly with an increase in contrast enhancement, towards the inferior margin. This is of questionable validity but could possibly represent some progression or inflammatory change. Other findings in the region are stable.   07/20/2019 - 09/15/2019 Chemotherapy   The patient had dexamethasone (DECADRON) 4 MG tablet, 1 of 1 cycle, Start date: --, End date: -- palonosetron (ALOXI) injection 0.25 mg, 0.25 mg, Intravenous,  Once, 3 of 4 cycles Administration: 0.25 mg (07/20/2019), 0.25 mg (08/10/2019), 0.25 mg (09/08/2019) CISplatin (PLATINOL) 84 mg in sodium chloride 0.9 % 250 mL chemo infusion, 40 mg/m2 = 84 mg (80 % of original dose 50 mg/m2), Intravenous,  Once, 3 of 4 cycles Dose modification: 40 mg/m2 (80 % of original dose 50 mg/m2, Cycle 1, Reason: Dose Not Tolerated) Administration: 84 mg  (07/20/2019), 84 mg (08/10/2019), 83 mg (09/08/2019) gemcitabine (GEMZAR) 1,600 mg in sodium chloride 0.9 % 250 mL chemo infusion, 1,672 mg (80 % of original dose 1,000 mg/m2), Intravenous,  Once, 3 of 4 cycles Dose modification: 800 mg/m2 (80 % of original dose 1,000 mg/m2, Cycle 1, Reason: Provider Judgment) Administration: 1,600 mg (07/20/2019), 1,600 mg (07/27/2019), 1,600 mg (08/10/2019), 1,600 mg (08/17/2019), 1,600 mg (09/08/2019), 1,672 mg (09/15/2019) ondansetron (ZOFRAN) 8 mg, dexamethasone (DECADRON) 10 mg in sodium chloride 0.9 % 50 mL IVPB, , Intravenous,  Once, 3 of 4 cycles Administration:  (09/15/2019) fosaprepitant (EMEND) 150 mg, dexamethasone (DECADRON) 12 mg in sodium chloride 0.9 % 145 mL IVPB, , Intravenous,  Once, 3 of 4 cycles Administration:  (07/20/2019),  (08/10/2019),  (09/08/2019)  for chemotherapy treatment.    10/02/2019 Imaging   CT neck As compared to 05/30/2019, no significant interval change in size of an ill-defined mass within the  right lateral neck, again measuring 3.3 x 1.8 cm in transaxial dimensions.   Unchanged mildly enlarged left level I lymph node measuring 1.1 cm in short axis.   Unchanged node or nodule at the thoracic inlet, measuring 1.3 x 0.8 cm.   Please refer to concurrently performed chest CT for a description of findings below the level of the thoracic inlet.     10/02/2019 Imaging   CT chest 1. No new or progressive findings in the chest to suggest metastatic disease. 2. Bilateral subcentimeter solid pulmonary nodules are stable since 2018. 3. Hyperdense 1.1 cm anterior liver focus, not clearly visualized on prior studies. Suggest MRI abdomen without and with IV contrast for further characterization.   10/20/2019 - 12/29/2019 Chemotherapy   The patient had ondansetron (ZOFRAN) injection 8 mg, 8 mg (100 % of original dose 8 mg), Intravenous,  Once, 2 of 5 cycles Dose modification: 8 mg (original dose 8 mg, Cycle 2) Administration: 8 mg  (11/17/2019), 8 mg (12/15/2019), 8 mg (12/29/2019) gemcitabine (GEMZAR) 2,000 mg in sodium chloride 0.9 % 250 mL chemo infusion, 2,090 mg, Intravenous,  Once, 3 of 6 cycles Administration: 2,000 mg (10/20/2019), 2,000 mg (11/03/2019), 2,000 mg (11/17/2019), 2,000 mg (12/15/2019), 2,000 mg (12/29/2019)  for chemotherapy treatment.    06/12/2020 Imaging   1. Enlarging superficial, exophytic component of the chronic right sternocleidomastoid muscle mass. See series 6, image 55. 2. Elsewhere stable CT appearance of the Neck.   06/12/2020 Imaging   Post treatment scarring in the left hemithorax, stable. No evidence recurrent or metastatic disease   06/14/2020 - 10/15/2020 Chemotherapy   He received carboplatin, 5FU and Beryle Flock       10/31/2020 Procedure   Interval improvement in right lateral lymph node mass. Improvement in dermal component as well as invasion of the right sternocleidomastoid muscle.   10 mm submental lymph node slightly enlarged compared to the prior study. Continued follow-up recommended.   11/01/2020 -  Chemotherapy   Patient is on Treatment Plan : HEAD/NECK Pembrolizumab Q21D       PHYSICAL EXAMINATION: ECOG PERFORMANCE STATUS: 1 - Symptomatic but completely ambulatory  Vitals:   04/10/22 1052  BP: 134/90  Pulse: 94  Resp: 18  Temp: (!) 97.4 F (36.3 C)   Filed Weights   04/10/22 1052  Weight: 185 lb 6.4 oz (84.1 kg)    GENERAL:alert, no distress and comfortable NEURO: alert & oriented x 3 with fluent speech, no focal motor/sensory deficits  LABORATORY DATA:  I have reviewed the data as listed    Component Value Date/Time   NA 140 04/10/2022 1037   NA 139 09/22/2017 0829   K 3.7 04/10/2022 1037   K 3.5 09/22/2017 0829   CL 104 04/10/2022 1037   CO2 29 04/10/2022 1037   CO2 26 09/22/2017 0829   GLUCOSE 127 (H) 04/10/2022 1037   GLUCOSE 133 09/22/2017 0829   BUN 16 04/10/2022 1037   BUN 14.1 09/22/2017 0829   CREATININE 0.82 04/10/2022 1037   CREATININE  0.9 09/22/2017 0829   CALCIUM 9.7 04/10/2022 1037   CALCIUM 9.1 09/22/2017 0829   PROT 7.3 04/10/2022 1037   PROT 6.8 09/22/2017 0829   ALBUMIN 4.5 04/10/2022 1037   ALBUMIN 4.1 09/22/2017 0829   AST 20 04/10/2022 1037   AST 22 09/22/2017 0829   ALT 22 04/10/2022 1037   ALT 30 09/22/2017 0829   ALKPHOS 51 04/10/2022 1037   ALKPHOS 55 09/22/2017 0829   BILITOT 0.3 04/10/2022 1037  BILITOT 0.35 09/22/2017 0829   GFRNONAA >60 04/10/2022 1037   GFRAA >60 07/05/2020 0845   GFRAA >60 02/06/2019 1215    No results found for: "SPEP", "UPEP"  Lab Results  Component Value Date   WBC 4.7 04/10/2022   NEUTROABS 2.3 04/10/2022   HGB 13.8 04/10/2022   HCT 40.0 04/10/2022   MCV 86.6 04/10/2022   PLT 259 04/10/2022      Chemistry      Component Value Date/Time   NA 140 04/10/2022 1037   NA 139 09/22/2017 0829   K 3.7 04/10/2022 1037   K 3.5 09/22/2017 0829   CL 104 04/10/2022 1037   CO2 29 04/10/2022 1037   CO2 26 09/22/2017 0829   BUN 16 04/10/2022 1037   BUN 14.1 09/22/2017 0829   CREATININE 0.82 04/10/2022 1037   CREATININE 0.9 09/22/2017 0829      Component Value Date/Time   CALCIUM 9.7 04/10/2022 1037   CALCIUM 9.1 09/22/2017 0829   ALKPHOS 51 04/10/2022 1037   ALKPHOS 55 09/22/2017 0829   AST 20 04/10/2022 1037   AST 22 09/22/2017 0829   ALT 22 04/10/2022 1037   ALT 30 09/22/2017 0829   BILITOT 0.3 04/10/2022 1037   BILITOT 0.35 09/22/2017 0829

## 2022-04-10 NOTE — Patient Instructions (Signed)
[facility]  Discharge Instructions: Thank you for choosing [facility] to provide your oncology and hematology care.   If you have a lab appointment with the [facility], please go directly to the [facility] and check in at the registration area.   Wear comfortable clothing and clothing appropriate for easy access to any Portacath or PICC line.   We strive to give you quality time with your provider. You may need to reschedule your appointment if you arrive late (15 or more minutes).  Arriving late affects you and other patients whose appointments are after yours.  Also, if you miss three or more appointments without notifying the office, you may be dismissed from the clinic at the provider's discretion.      For prescription refill requests, have your pharmacy contact our office and allow 72 hours for refills to be completed.    Today you received the following chemotherapy and/or immunotherapy agents: Pembrolizumab.       To help prevent nausea and vomiting after your treatment, we encourage you to take your nausea medication as directed.  BELOW ARE SYMPTOMS THAT SHOULD BE REPORTED IMMEDIATELY: *FEVER GREATER THAN 100.4 F (38 C) OR HIGHER *CHILLS OR SWEATING *NAUSEA AND VOMITING THAT IS NOT CONTROLLED WITH YOUR NAUSEA MEDICATION *UNUSUAL SHORTNESS OF BREATH *UNUSUAL BRUISING OR BLEEDING *URINARY PROBLEMS (pain or burning when urinating, or frequent urination) *BOWEL PROBLEMS (unusual diarrhea, constipation, pain near the anus) TENDERNESS IN MOUTH AND THROAT WITH OR WITHOUT PRESENCE OF ULCERS (sore throat, sores in mouth, or a toothache) UNUSUAL RASH, SWELLING OR PAIN  UNUSUAL VAGINAL DISCHARGE OR ITCHING   Items with * indicate a potential emergency and should be followed up as soon as possible or go to the Emergency Department if any problems should occur.  Please show the CHEMOTHERAPY ALERT CARD or IMMUNOTHERAPY ALERT CARD at  check-in to the Emergency Department and triage nurse.  Should you have questions after your visit or need to cancel or reschedule your appointment, please contact [facility]  Dept: [phone]  and follow the prompts.  Office hours are 8:00 a.m. to 4:30 p.m. Monday - Friday. Please note that voicemails left after 4:00 p.m. may not be returned until the following business day.  We are closed weekends and major holidays. You have access to a nurse at all times for urgent questions. Please call the main number to the clinic Dept: [phone] and follow the prompts.   For any non-urgent questions, you may also contact your provider using MyChart. We now offer e-Visits for anyone 7 and older to request care online for non-urgent symptoms. For details visit [email or web address].   Also download the MyChart app! Go to the app store, search "MyChart", open the app, select [facility], and log in with your MyChart username and password.  Due to Covid, a mask is required upon entering the hospital/clinic. If you do not have a mask, one will be given to you upon arrival. For doctor visits, patients may have 1 support person aged 75 or older with them. For treatment visits, patients cannot have anyone with them due to current Covid guidelines and our immunocompromised population.   Pembrolizumab injection What is this medication? PEMBROLIZUMAB (pem broe liz ue mab) is a monoclonal antibody. It is used to treat certain types of cancer. This medicine may be used for other purposes; ask your health care provider or pharmacist if you have questions. COMMON BRAND NAME(S): [employer]  What should I tell my care team before I take this medication? They need to know if you have any of these conditions: autoimmune diseases like Crohn's disease, ulcerative colitis, or lupus have had or planning to have an allogeneic stem cell transplant (uses someone else's stem cells) history of  organ transplant history of chest radiation nervous system problems like myasthenia gravis or Guillain-Barre syndrome an unusual or allergic reaction to pembrolizumab, other medicines, foods, dyes, or preservatives pregnant or trying to get pregnant breast-feeding How should I use this medication? This medicine is for infusion into a vein. It is given by a health care professional in a hospital or clinic setting. A special MedGuide will be given to you before each treatment. Be sure to read this information carefully each time. Talk to your pediatrician regarding the use of this medicine in children. While this drug may be prescribed for children as young as 6 months for selected conditions, precautions do apply. Overdosage: If you think you have taken too much of this medicine contact a poison control center or emergency room at once. NOTE: This medicine is only for you. Do not share this medicine with others. What if I miss a dose? It is important not to miss your dose. Call your doctor or health care professional if you are unable to keep an appointment. What may interact with this medication? Interactions have not been studied. This list may not describe all possible interactions. Give your health care provider a list of all the medicines, herbs, non-prescription drugs, or dietary supplements you use. Also tell them if you smoke, drink alcohol, or use illegal drugs. Some items may interact with your medicine. What should I watch for while using this medication? Your condition will be monitored carefully while you are receiving this medicine. You may need blood work done while you are taking this medicine. Do not become pregnant while taking this medicine or for 4 months after stopping it. Women should inform their doctor if they wish to become pregnant or think they might be pregnant. There is a potential for serious side effects to an unborn child. Talk to your health care professional or  pharmacist for more information. Do not breast-feed an infant while taking this medicine or for 4 months after the last dose. What side effects may I notice from receiving this medication? Side effects that you should report to your doctor or health care professional as soon as possible: allergic reactions like skin rash, itching or hives, swelling of the face, lips, or tongue bloody or black, tarry breathing problems changes in vision chest pain chills confusion constipation cough diarrhea dizziness or feeling faint or lightheaded fast or irregular heartbeat fever flushing joint pain low blood counts - this medicine may decrease the number of white blood cells, red blood cells and platelets. You may be at increased risk for infections and bleeding. muscle pain muscle weakness pain, tingling, numbness in the hands or feet persistent headache redness, blistering, peeling or loosening of the skin, including inside the mouth signs and symptoms of high blood sugar such as dizziness; dry mouth; dry skin; fruity breath; nausea; stomach pain; increased hunger or thirst; increased urination signs and symptoms of kidney injury like trouble passing urine or change in the amount of urine signs and symptoms of liver injury like dark urine, light-colored stools, loss of appetite, nausea, right upper belly pain, yellowing of the eyes or skin sweating swollen lymph nodes weight loss Side effects that usually do not require  medical attention (report to your doctor or health care professional if they continue or are bothersome): decreased appetite hair loss tiredness This list may not describe all possible side effects. Call your doctor for medical advice about side effects. You may report side effects to FDA at 1-800-FDA-1088. Where should I keep my medication? This drug is given in a hospital or clinic and will not be stored at home. NOTE: This sheet is a summary. It may not cover all possible  information. If you have questions about this medicine, talk to your doctor, pharmacist, or health care provider.  2023 Elsevier/Gold Standard (2021-08-22 00:00:00)

## 2022-04-10 NOTE — Assessment & Plan Note (Signed)
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

## 2022-04-10 NOTE — Assessment & Plan Note (Signed)
I reminded the patient importance of taking Synthroid as directed We will adjust the dose of his medicine as needed

## 2022-04-14 ENCOUNTER — Other Ambulatory Visit: Payer: Self-pay | Admitting: *Deleted

## 2022-04-14 ENCOUNTER — Other Ambulatory Visit (HOSPITAL_BASED_OUTPATIENT_CLINIC_OR_DEPARTMENT_OTHER): Payer: Self-pay

## 2022-04-14 MED ORDER — METHADONE HCL 10 MG PO TABS
10.0000 mg | ORAL_TABLET | Freq: Two times a day (BID) | ORAL | 0 refills | Status: DC
  Filled 2022-04-14: qty 30, 15d supply, fill #0

## 2022-04-14 NOTE — Telephone Encounter (Signed)
Patient requested refill of methadone 10 mg - sent to [facility] Refill request routed to  Dr. Alvy Bimler

## 2022-04-16 ENCOUNTER — Other Ambulatory Visit (HOSPITAL_BASED_OUTPATIENT_CLINIC_OR_DEPARTMENT_OTHER): Payer: Self-pay

## 2022-04-17 ENCOUNTER — Other Ambulatory Visit (HOSPITAL_BASED_OUTPATIENT_CLINIC_OR_DEPARTMENT_OTHER): Payer: Self-pay

## 2022-04-27 ENCOUNTER — Other Ambulatory Visit: Payer: Self-pay

## 2022-05-02 ENCOUNTER — Other Ambulatory Visit: Payer: Self-pay | Admitting: Hematology and Oncology

## 2022-05-03 ENCOUNTER — Other Ambulatory Visit (HOSPITAL_BASED_OUTPATIENT_CLINIC_OR_DEPARTMENT_OTHER): Payer: Self-pay

## 2022-05-03 ENCOUNTER — Encounter: Payer: Self-pay | Admitting: Hematology and Oncology

## 2022-05-03 MED ORDER — ONDANSETRON HCL 8 MG PO TABS
8.0000 mg | ORAL_TABLET | Freq: Three times a day (TID) | ORAL | 1 refills | Status: DC | PRN
  Filled 2022-05-03: qty 90, 30d supply, fill #0

## 2022-05-04 ENCOUNTER — Other Ambulatory Visit (HOSPITAL_BASED_OUTPATIENT_CLINIC_OR_DEPARTMENT_OTHER): Payer: Self-pay

## 2022-05-05 ENCOUNTER — Other Ambulatory Visit (HOSPITAL_BASED_OUTPATIENT_CLINIC_OR_DEPARTMENT_OTHER): Payer: Self-pay

## 2022-05-07 ENCOUNTER — Other Ambulatory Visit (HOSPITAL_BASED_OUTPATIENT_CLINIC_OR_DEPARTMENT_OTHER): Payer: Self-pay

## 2022-05-08 ENCOUNTER — Other Ambulatory Visit (HOSPITAL_BASED_OUTPATIENT_CLINIC_OR_DEPARTMENT_OTHER): Payer: Self-pay

## 2022-05-11 ENCOUNTER — Other Ambulatory Visit (HOSPITAL_BASED_OUTPATIENT_CLINIC_OR_DEPARTMENT_OTHER): Payer: Self-pay

## 2022-05-14 ENCOUNTER — Other Ambulatory Visit (HOSPITAL_BASED_OUTPATIENT_CLINIC_OR_DEPARTMENT_OTHER): Payer: Self-pay

## 2022-05-14 ENCOUNTER — Telehealth: Payer: Self-pay

## 2022-05-14 ENCOUNTER — Other Ambulatory Visit: Payer: Self-pay | Admitting: Hematology and Oncology

## 2022-05-14 MED ORDER — OXYCODONE HCL 5 MG PO TABS
5.0000 mg | ORAL_TABLET | Freq: Four times a day (QID) | ORAL | 0 refills | Status: DC | PRN
  Filled 2022-05-14: qty 90, 23d supply, fill #0

## 2022-05-14 NOTE — Telephone Encounter (Signed)
Called and given below message Rx sent. He appreciated the call and verbalized understanding.

## 2022-05-14 NOTE — Telephone Encounter (Signed)
done

## 2022-05-14 NOTE — Telephone Encounter (Signed)
He called and left a message requesting a refill on Methadone Rx to [facility].

## 2022-05-15 ENCOUNTER — Other Ambulatory Visit: Payer: Self-pay | Admitting: Hematology and Oncology

## 2022-05-15 ENCOUNTER — Other Ambulatory Visit (HOSPITAL_BASED_OUTPATIENT_CLINIC_OR_DEPARTMENT_OTHER): Payer: Self-pay

## 2022-05-15 ENCOUNTER — Telehealth: Payer: Self-pay

## 2022-05-15 MED ORDER — METHADONE HCL 10 MG PO TABS
10.0000 mg | ORAL_TABLET | Freq: Two times a day (BID) | ORAL | 0 refills | Status: DC
  Filled 2022-05-15: qty 60, 30d supply, fill #0

## 2022-05-15 NOTE — Telephone Encounter (Signed)
Done He can keep those as emergency supply

## 2022-05-15 NOTE — Telephone Encounter (Signed)
Called him back and left a message Rx sent to pharmacy.

## 2022-05-15 NOTE — Telephone Encounter (Signed)
He called and left a message. He needs a refill of Methadone Rx to [facility]. Oxycodone was sent yesterday and he is longer taking it. He is only taking the Methadone and is very proud of himself.

## 2022-05-22 ENCOUNTER — Encounter: Payer: Self-pay | Admitting: Hematology and Oncology

## 2022-05-22 ENCOUNTER — Inpatient Hospital Stay (HOSPITAL_BASED_OUTPATIENT_CLINIC_OR_DEPARTMENT_OTHER): Payer: Medicaid Other | Admitting: Hematology and Oncology

## 2022-05-22 ENCOUNTER — Inpatient Hospital Stay: Payer: Medicaid Other | Attending: Hematology and Oncology

## 2022-05-22 ENCOUNTER — Other Ambulatory Visit: Payer: Self-pay

## 2022-05-22 ENCOUNTER — Inpatient Hospital Stay: Payer: Medicaid Other

## 2022-05-22 VITALS — BP 147/82 | HR 77 | Resp 18 | Ht 69.0 in | Wt 183.2 lb

## 2022-05-22 DIAGNOSIS — Z7189 Other specified counseling: Secondary | ICD-10-CM

## 2022-05-22 DIAGNOSIS — R112 Nausea with vomiting, unspecified: Secondary | ICD-10-CM

## 2022-05-22 DIAGNOSIS — Z5112 Encounter for antineoplastic immunotherapy: Secondary | ICD-10-CM | POA: Diagnosis present

## 2022-05-22 DIAGNOSIS — Z79899 Other long term (current) drug therapy: Secondary | ICD-10-CM | POA: Insufficient documentation

## 2022-05-22 DIAGNOSIS — G893 Neoplasm related pain (acute) (chronic): Secondary | ICD-10-CM

## 2022-05-22 DIAGNOSIS — C7800 Secondary malignant neoplasm of unspecified lung: Secondary | ICD-10-CM

## 2022-05-22 DIAGNOSIS — Z95828 Presence of other vascular implants and grafts: Secondary | ICD-10-CM

## 2022-05-22 DIAGNOSIS — C119 Malignant neoplasm of nasopharynx, unspecified: Secondary | ICD-10-CM | POA: Insufficient documentation

## 2022-05-22 DIAGNOSIS — C760 Malignant neoplasm of head, face and neck: Secondary | ICD-10-CM | POA: Diagnosis not present

## 2022-05-22 LAB — CBC WITH DIFFERENTIAL (CANCER CENTER ONLY)
Abs Immature Granulocytes: 0.02 10*3/uL (ref 0.00–0.07)
Basophils Absolute: 0 10*3/uL (ref 0.0–0.1)
Basophils Relative: 0 %
Eosinophils Absolute: 0.2 10*3/uL (ref 0.0–0.5)
Eosinophils Relative: 4 %
HCT: 38.3 % — ABNORMAL LOW (ref 39.0–52.0)
Hemoglobin: 13.4 g/dL (ref 13.0–17.0)
Immature Granulocytes: 0 %
Lymphocytes Relative: 30 %
Lymphs Abs: 1.6 10*3/uL (ref 0.7–4.0)
MCH: 30 pg (ref 26.0–34.0)
MCHC: 35 g/dL (ref 30.0–36.0)
MCV: 85.7 fL (ref 80.0–100.0)
Monocytes Absolute: 0.3 10*3/uL (ref 0.1–1.0)
Monocytes Relative: 6 %
Neutro Abs: 3.2 10*3/uL (ref 1.7–7.7)
Neutrophils Relative %: 60 %
Platelet Count: 214 10*3/uL (ref 150–400)
RBC: 4.47 MIL/uL (ref 4.22–5.81)
RDW: 13.1 % (ref 11.5–15.5)
WBC Count: 5.3 10*3/uL (ref 4.0–10.5)
nRBC: 0 % (ref 0.0–0.2)

## 2022-05-22 LAB — CMP (CANCER CENTER ONLY)
ALT: 25 U/L (ref 0–44)
AST: 28 U/L (ref 15–41)
Albumin: 4.4 g/dL (ref 3.5–5.0)
Alkaline Phosphatase: 54 U/L (ref 38–126)
Anion gap: 5 (ref 5–15)
BUN: 14 mg/dL (ref 6–20)
CO2: 29 mmol/L (ref 22–32)
Calcium: 8.9 mg/dL (ref 8.9–10.3)
Chloride: 103 mmol/L (ref 98–111)
Creatinine: 0.97 mg/dL (ref 0.61–1.24)
GFR, Estimated: >60 mL/min (ref >60)
Glucose, Bld: 132 mg/dL — ABNORMAL HIGH (ref 70–99)
Potassium: 3.4 mmol/L — ABNORMAL LOW (ref 3.5–5.1)
Sodium: 137 mmol/L (ref 135–145)
Total Bilirubin: 0.5 mg/dL (ref 0.3–1.2)
Total Protein: 7.4 g/dL (ref 6.5–8.1)

## 2022-05-22 LAB — TSH: TSH: 0.608 u[IU]/mL (ref 0.350–4.500)

## 2022-05-22 MED ORDER — SODIUM CHLORIDE 0.9 % IV SOLN
400.0000 mg | Freq: Once | INTRAVENOUS | Status: AC
  Administered 2022-05-22: 400 mg via INTRAVENOUS
  Filled 2022-05-22: qty 16

## 2022-05-22 MED ORDER — ANTICOAGULANT SODIUM CITRATE LOCK FLUSH 4% (120 MG/3ML) IV SOLN: 5.0000 mL | PREFILLED_SYRINGE | Freq: Once | INTRAVENOUS | Status: DC

## 2022-05-22 MED ORDER — SODIUM CHLORIDE 0.9 % IV SOLN: Freq: Once | INTRAVENOUS | Status: AC

## 2022-05-22 MED ORDER — SODIUM CHLORIDE 0.9% FLUSH
10.0000 mL | Freq: Once | INTRAVENOUS | Status: AC
  Administered 2022-05-22: 10 mL

## 2022-05-22 MED ORDER — ONDANSETRON HCL 4 MG/2ML IJ SOLN
8.0000 mg | Freq: Once | INTRAMUSCULAR | Status: AC
  Administered 2022-05-22: 8 mg via INTRAVENOUS
  Filled 2022-05-22: qty 4

## 2022-05-22 MED ORDER — SODIUM CHLORIDE 0.9% FLUSH
10.0000 mL | INTRAVENOUS | Status: DC | PRN
  Administered 2022-05-22: 10 mL

## 2022-05-22 NOTE — Progress Notes (Signed)
OFF PATHWAY REGIMEN - Head and Neck  No Change  Continue With Treatment as Ordered.  Original Decision Date/Time: 06/03/2020 12:36   [id number]:Carboplatin IV D1 + Fluorouracil CIV D1-4 + Pembrolizumab IV D1 q21 Days (C1-6) Followed by Pembrolizumab IV D1 q21 Days:   A cycle is every 21 days:     Carboplatin      Fluorouracil      Pembrolizumab   **Always confirm dose/schedule in your pharmacy ordering system**  Patient Characteristics: Nasopharyngeal, Metastatic, Third Line & Beyond, TMB-L/Unknown Disease Classification: Nasopharyngeal Current Disease Status: Metastatic Disease AJCC T Category: [place] AJCC N Category: NX AJCC M Category: M1 AJCC 8 Stage Grouping: IVB Line of Therapy: Third Geophysical data processor Tumor Mutational Burden (TMB): TMB-L Intent of Therapy: Non-Curative / Palliative Intent, Discussed with Patient

## 2022-05-22 NOTE — Assessment & Plan Note (Signed)
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

## 2022-05-22 NOTE — Patient Instructions (Signed)
[facility]  Discharge Instructions: Thank you for choosing [facility] to provide your oncology and hematology care.   If you have a lab appointment with the [facility], please go directly to the [facility] and check in at the registration area.   Wear comfortable clothing and clothing appropriate for easy access to any Portacath or PICC line.   We strive to give you quality time with your provider. You may need to reschedule your appointment if you arrive late (15 or more minutes).  Arriving late affects you and other patients whose appointments are after yours.  Also, if you miss three or more appointments without notifying the office, you may be dismissed from the clinic at the provider's discretion.      For prescription refill requests, have your pharmacy contact our office and allow 72 hours for refills to be completed.    Today you received the following chemotherapy and/or immunotherapy agents: Pembrolizumab.       To help prevent nausea and vomiting after your treatment, we encourage you to take your nausea medication as directed.  BELOW ARE SYMPTOMS THAT SHOULD BE REPORTED IMMEDIATELY: *FEVER GREATER THAN 100.4 F (38 C) OR HIGHER *CHILLS OR SWEATING *NAUSEA AND VOMITING THAT IS NOT CONTROLLED WITH YOUR NAUSEA MEDICATION *UNUSUAL SHORTNESS OF BREATH *UNUSUAL BRUISING OR BLEEDING *URINARY PROBLEMS (pain or burning when urinating, or frequent urination) *BOWEL PROBLEMS (unusual diarrhea, constipation, pain near the anus) TENDERNESS IN MOUTH AND THROAT WITH OR WITHOUT PRESENCE OF ULCERS (sore throat, sores in mouth, or a toothache) UNUSUAL RASH, SWELLING OR PAIN  UNUSUAL VAGINAL DISCHARGE OR ITCHING   Items with * indicate a potential emergency and should be followed up as soon as possible or go to the Emergency Department if any problems should occur.  Please show the CHEMOTHERAPY ALERT CARD or IMMUNOTHERAPY ALERT CARD at  check-in to the Emergency Department and triage nurse.  Should you have questions after your visit or need to cancel or reschedule your appointment, please contact [facility]  Dept: [phone]  and follow the prompts.  Office hours are 8:00 a.m. to 4:30 p.m. Monday - Friday. Please note that voicemails left after 4:00 p.m. may not be returned until the following business day.  We are closed weekends and major holidays. You have access to a nurse at all times for urgent questions. Please call the main number to the clinic Dept: [phone] and follow the prompts.   For any non-urgent questions, you may also contact your provider using MyChart. We now offer e-Visits for anyone 40 and older to request care online for non-urgent symptoms. For details visit [email or web address].   Also download the MyChart app! Go to the app store, search "MyChart", open the app, select [facility], and log in with your MyChart username and password.  Due to Covid, a mask is required upon entering the hospital/clinic. If you do not have a mask, one will be given to you upon arrival. For doctor visits, patients may have 1 support Denora Wysocki aged 74 or older with them. For treatment visits, patients cannot have anyone with them due to current Covid guidelines and our immunocompromised population.   Pembrolizumab injection What is this medication? PEMBROLIZUMAB (pem broe liz ue mab) is a monoclonal antibody. It is used to treat certain types of cancer. This medicine may be used for other purposes; ask your health care provider or pharmacist if you have questions. COMMON BRAND NAME(S): [employer]  What should I tell my care team before I take this medication? They need to know if you have any of these conditions: autoimmune diseases like Crohn's disease, ulcerative colitis, or lupus have had or planning to have an allogeneic stem cell transplant (uses someone else's stem cells) history of  organ transplant history of chest radiation nervous system problems like myasthenia gravis or Guillain-Barre syndrome an unusual or allergic reaction to pembrolizumab, other medicines, foods, dyes, or preservatives pregnant or trying to get pregnant breast-feeding How should I use this medication? This medicine is for infusion into a vein. It is given by a health care professional in a hospital or clinic setting. A special MedGuide will be given to you before each treatment. Be sure to read this information carefully each time. Talk to your pediatrician regarding the use of this medicine in children. While this drug may be prescribed for children as young as 6 months for selected conditions, precautions do apply. Overdosage: If you think you have taken too much of this medicine contact a poison control center or emergency room at once. NOTE: This medicine is only for you. Do not share this medicine with others. What if I miss a dose? It is important not to miss your dose. Call your doctor or health care professional if you are unable to keep an appointment. What may interact with this medication? Interactions have not been studied. This list may not describe all possible interactions. Give your health care provider a list of all the medicines, herbs, non-prescription drugs, or dietary supplements you use. Also tell them if you smoke, drink alcohol, or use illegal drugs. Some items may interact with your medicine. What should I watch for while using this medication? Your condition will be monitored carefully while you are receiving this medicine. You may need blood work done while you are taking this medicine. Do not become pregnant while taking this medicine or for 4 months after stopping it. Women should inform their doctor if they wish to become pregnant or think they might be pregnant. There is a potential for serious side effects to an unborn child. Talk to your health care professional or  pharmacist for more information. Do not breast-feed an infant while taking this medicine or for 4 months after the last dose. What side effects may I notice from receiving this medication? Side effects that you should report to your doctor or health care professional as soon as possible: allergic reactions like skin rash, itching or hives, swelling of the face, lips, or tongue bloody or black, tarry breathing problems changes in vision chest pain chills confusion constipation cough diarrhea dizziness or feeling faint or lightheaded fast or irregular heartbeat fever flushing joint pain low blood counts - this medicine may decrease the number of white blood cells, red blood cells and platelets. You may be at increased risk for infections and bleeding. muscle pain muscle weakness pain, tingling, numbness in the hands or feet persistent headache redness, blistering, peeling or loosening of the skin, including inside the mouth signs and symptoms of high blood sugar such as dizziness; dry mouth; dry skin; fruity breath; nausea; stomach pain; increased hunger or thirst; increased urination signs and symptoms of kidney injury like trouble passing urine or change in the amount of urine signs and symptoms of liver injury like dark urine, light-colored stools, loss of appetite, nausea, right upper belly pain, yellowing of the eyes or skin sweating swollen lymph nodes weight loss Side effects that usually do not require  medical attention (report to your doctor or health care professional if they continue or are bothersome): decreased appetite hair loss tiredness This list may not describe all possible side effects. Call your doctor for medical advice about side effects. You may report side effects to FDA at 1-800-FDA-1088. Where should I keep my medication? This drug is given in a hospital or clinic and will not be stored at home. NOTE: This sheet is a summary. It may not cover all possible  information. If you have questions about this medicine, talk to your doctor, pharmacist, or health care provider.  2023 Elsevier/Gold Standard (2021-08-22 00:00:00)

## 2022-05-22 NOTE — Assessment & Plan Note (Signed)
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

## 2022-05-22 NOTE — Progress Notes (Signed)
[place] OFFICE PROGRESS NOTE  Patient Care Team: Patient, No Pcp Per as PCP - General (General Practice) [name], MD as Consulting Physician (Hematology and Oncology) Eppie Gibson, MD as Attending Physician (Radiation Oncology) Jodi Marble, MD as Consulting Physician (Otolaryngology) Philomena Doheny, MD as Referring Physician (Plastic Surgery) Irene Shipper, MD as Consulting Physician (Gastroenterology) System, Provider Not In  ASSESSMENT & PLAN:  Malignant neoplasm of head and neck [facility]) The patient has no signs of recurrent disease in his neck His severe neck pain is related to prior treatment related fibrosis He will continue treatment indefinitely with pembrolizumab I plan to repeat imaging study end of the year  Cancer associated pain His severe neck pain is related to complication from prior surgery and radiation, causing severe fibrosis and limitation of range of motion of his neck I plan to continue methadone twice daily and oxycodone 5 mg 3 times a day in the future  I reminded him about risk of constipation with both pain medicine  Nausea and vomiting He has intermittent nausea He has Zofran to take as needed I recommend aggressive laxative therapy  Orders Placed This Encounter  Procedures   CBC with Differential ([place] Only)    Standing Status:   Future    Standing Expiration Date:   07/04/2023   CMP ([place] only)    Standing Status:   Future    Standing Expiration Date:   07/04/2023   T4    Standing Status:   Future    Standing Expiration Date:   07/04/2023   TSH    Standing Status:   Future    Standing Expiration Date:   07/04/2023   CBC with Differential ([place] Only)    Standing Status:   Future    Standing Expiration Date:   08/15/2023   CMP ([place] only)    Standing Status:   Future    Standing Expiration Date:   08/15/2023   T4    Standing Status:   Future    Standing Expiration Date:   08/15/2023    TSH    Standing Status:   Future    Standing Expiration Date:   08/15/2023   CBC with Differential ([employer] Only)    Standing Status:   Future    Standing Expiration Date:   09/26/2023   CMP ([place] only)    Standing Status:   Future    Standing Expiration Date:   09/26/2023   T4    Standing Status:   Future    Standing Expiration Date:   09/26/2023   TSH    Standing Status:   Future    Standing Expiration Date:   09/26/2023   CBC with Differential ([facility] Only)    Standing Status:   Future    Standing Expiration Date:   11/07/2023   CMP ([place] only)    Standing Status:   Future    Standing Expiration Date:   11/07/2023   T4    Standing Status:   Future    Standing Expiration Date:   11/07/2023   TSH    Standing Status:   Future    Standing Expiration Date:   11/07/2023   CBC with Differential ([facility] Only)    Standing Status:   Future    Standing Expiration Date:   12/19/2023   CMP ([place] only)    Standing Status:   Future    Standing Expiration  Date:   12/19/2023   T4    Standing Status:   Future    Standing Expiration Date:   12/19/2023   TSH    Standing Status:   Future    Standing Expiration Date:   12/19/2023   CBC with Differential ([facility] Only)    Standing Status:   Future    Standing Expiration Date:   01/30/2024   CMP ([place] only)    Standing Status:   Future    Standing Expiration Date:   01/30/2024   T4    Standing Status:   Future    Standing Expiration Date:   01/30/2024   TSH    Standing Status:   Future    Standing Expiration Date:   01/30/2024    All questions were answered. The patient knows to call the clinic with any problems, questions or concerns. The total time spent in the appointment was 20 minutes encounter with patients including review of chart and various tests results, discussions about plan of care and coordination of care plan   [name], MD 05/22/2022 6:12 PM  INTERVAL  HISTORY: Please see below for problem oriented charting. he returns for treatment follow-up on maintenance pembrolizumab He has occasional nausea intermittently but has constipation as well His chronic pain is stable  REVIEW OF SYSTEMS:   Constitutional: Denies fevers, chills or abnormal weight loss Eyes: Denies blurriness of vision Ears, nose, mouth, throat, and face: Denies mucositis or sore throat Respiratory: Denies cough, dyspnea or wheezes Skin: Denies abnormal skin rashes Lymphatics: Denies new lymphadenopathy or easy bruising Neurological:Denies numbness, tingling or new weaknesses Behavioral/Psych: Mood is stable, no new changes  All other systems were reviewed with the patient and are negative.  I have reviewed the past medical history, past surgical history, social history and family history with the patient and they are unchanged from previous note.  ALLERGIES:  is allergic to phenergan [promethazine hcl], heparin, and clindamycin.  MEDICATIONS:  Current Outpatient Medications  Medication Sig Dispense Refill   blood glucose meter kit and supplies KIT Dispense based on patient and insurance preference. Use up to four times daily as directed. (FOR ICD-9 250.00, 250.01). 1 each 1   cetirizine (ZYRTEC) 10 MG tablet Take 1 tablet (10 mg total) by mouth daily as needed for allergies. 100 tablet 0   levothyroxine (SYNTHROID) 125 MCG tablet Take 1 tablet (125 mcg total) by mouth daily before breakfast. 60 tablet 1   metFORMIN (GLUCOPHAGE) 500 MG tablet Take 1 tablet (500 mg total) by mouth 2 (two) times daily with a meal. 60 tablet 2   methadone (DOLOPHINE) 10 MG tablet Take 1 tablet (10 mg total) by mouth every 12 (twelve) hours. 60 tablet 0   ondansetron (ZOFRAN) 8 MG tablet Take 1 tablet (8 mg total) by mouth every 8 (eight) hours as needed for nausea or vomiting. 90 tablet 1   oxyCODONE (OXY IR/ROXICODONE) 5 MG immediate release tablet Take 1 tablet (5 mg total) by mouth every 6  (six) hours as needed for severe pain. 90 tablet 0   No current facility-administered medications for this visit.   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 Pan-endoscopy elsewhere showed cancer from nasopharyngeal space.   01/04/2007 - 02/20/2007 Chemotherapy   He received 2 cycles of cisplatin and 5FU followed by concurrent chemo with weekly cisplatin and radiation. He only received 2 doses of chemo due to severe mucositis, nausea and weight loss.   04/05/2007 - 08/04/2007 Chemotherapy   He received 4 more courses of cisplatin with 5FU and had complete response   07/05/2009 Procedure   Fine-needle aspirate of the right level II lymph nodes come from recurrent metastatic disease. Repeat endoscopy and CT scan show no evidence of disease elsewhere.   07/08/2009 - 12/02/2009 Chemotherapy   He was given 6 cycles of carboplatin, 5-FU and docetaxel   12/03/2009 Surgery   He has surgery to the residual lymph node on the right neck which showed no evidence of disease.   02/22/2012 Imaging   Repeat imaging study showed large recurrent mass. He was referred elsewhere for further treatment.   05/03/2012 Surgery   He  underwent left upper lobectomy.   04/29/2013 Imaging   PEt scan showed lesion on right level II B and lower lung was abnormal   06/03/2013 - 02/02/2014 Chemotherapy   He had 6 cycles of chemotherapy when he was found to have recurrence of cancer and had received oxaliplatin and capecitabine   06/07/2014 Imaging   PET CT scan showed persistent disease in the right neck lymph nodes and left lung   06/29/2014 Procedure   Accession: [id number] repeat LUL biopsy confirmed metastatic cancer   07/18/2014 - 07/31/2014 Radiation Therapy   He received palliative radiation therapy to the lungs   10/10/2014 Imaging   CT scan of the chest, abdomen and pelvis show regression in the size of the lung nodule in the left upper lobe and stable pulmonary nodules   01/24/2015 Imaging   CT scan showed stable disease in neck and lung   06/19/2015 Imaging   CT scan of the neck and the chest show possible mild progression of the nodule in the right side of the neck.   06/25/2015 Imaging   PET scan confirmed disease recurrence in the neck   07/07/2015 Imaging   [facility]   09/03/2015 - 08/26/2018 Chemotherapy   He received palliative chemo with Nivolumab   10/29/2015 Imaging   PET CT showed positive response to Rx   02/28/2016 Imaging   Ct abdomen showed abnormal thinkening in his stomach   03/03/2016 Imaging   CT: Right sternocleidomastoid muscle metastasis appears less distinct but otherwise not significantly changed in size or configuration since 06/19/2015.2. Left level 3 lymph node which was hypermetabolic by PET-CT in January 2017 appears slightly smaller   04/01/2016 Imaging   CT cervical spine showed no acute fracture or traumatic malalignment in the cervical spine   04/22/2016 Procedure   Port-a-cath placed.   06/16/2016 Imaging   Ct neck showed right sternocleidomastoid muscle metastasis is further decreased in conspicuity since May,  and has mildly decreased in size since September  2016. Continued stability of sub-centimeter left cervical lymph nodes. No new or progressive metastatic disease in the neck.   06/16/2016 Imaging   CT chest showed stable masslike radiation fibrosis in the left upper lobe. Stable subcentimeter pulmonary nodules in the bilateral lower lobes. No new or progressive metastatic disease in the chest. Nonobstructing left renal stone.   10/13/2016 Imaging   Ct neck showed unchanged right sternocleidomastoid muscle metastasis. Unchanged subcentimeter left cervical lymph nodes. No evidence of new or progressive metastatic disease in the neck.   10/13/2016 Imaging   CT chest showed tiny hypervascular foci in the liver, not definitely seen on prior imaging of 06/16/2016 and 02/28/2016. Abdomen MRI without and with contrast recommended to further evaluate as metastatic disease is a concern. 2. Stable appearance of post treatment changes left upper lung and scattered tiny bilateral pulmonary nodules.   02/11/2017 Imaging   Ct neck: Lymph node mass right posterior neck appears improved from the prior study. Small posterior lymph nodes on the left unchanged. Occluded right jugular vein unchanged.   02/11/2017 Imaging   1. Similar appearance of postsurgical and radiation changes in the left upper lobe. 2. Similar bilateral pulmonary nodules. 3. No thoracic adenopathy. 4. Subtle foci of post-contrast enhancement within the liver are suboptimally characterized on this nondedicated study. Likely similar. These could either be re-evaluated at followup or more entirely characterized with abdominal MRI. 5. Left nephrolithiasis.   05/19/2017 Imaging   Matted lymph node mass right posterior neck appears larger in the recent CT. Accurate measurements difficult due to infiltrating tumor margins and infiltration of the muscle. Right jugular vein again appears occluded or resected. Small left posterior lymph nodes stable. Left upper lobe airspace density stable and similar to  the prior CT   06/03/2017 PET scan   1. Hypermetabolic ill-defined right level IIb lymph node, about 1.3 cm in diameter with maximum SUV 9.5 (formerly 8.1). Appearance suspicious for residual/recurrent malignancy. No worrisome left-sided lesion. 2. Left suprahilar indistinct opacity demonstrates no worrisome hypermetabolic activity. The 5 mm left lower lobe pulmonary nodule is stable and not currently hypermetabolic although below sensitive PET-CT size thresholds. 3. Other imaging findings of potential clinical significance: Bilateral nonobstructive nephrolithiasis. Chronic bilateral maxillary sinusitis.   05/11/2018 PET scan   1. Continued chronic accentuated metabolic activity in the vicinity of right level IIB and the adjacent right sternocleidomastoid muscle, with ill definition of surrounding tissue planes. Maximum SUV is currently 8.1, formerly 9.5. Accentuated metabolic activity is been present in this vicinity back through 06/25/2015, and there was also some low-level activity in this vicinity on 06/07/2014. Some of this may be from scarring and local muscular activity although clearly a component of residual tumor is difficult to exclude given the focally high activity. 2. Other imaging findings of potential clinical significance: Chronic bilateral maxillary sinusitis. Chronic scarring in the left upper lobe. Chronically stable 5 mm left lower lobe nodule is considered benign. Nonobstructive left nephrolithiasis.   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 COMMENT:No significant keratinization is identified. Other basaloid carcinomas are in the differential diagnosis. No cell block material is available for further testing.   09/12/2018 Procedure   He underwent fine Needle Aspiration   10/04/2018 PET scan   1. Significant progression of local  recurrence laterally in the mid right neck  with an enlarging, increasingly hypermetabolic soft tissue mass. This involves the right sternocleidomastoid muscle. 2. Small lymph nodes in the right axilla are increasingly hypermetabolic. These are nonspecific and potentially reactive, although could reflect a small metastases. Small hypermetabolic nodule in the left suprasternal notch is unchanged. 3. No other evidence of metastatic disease.     10/07/2018 - 12/23/2018 Chemotherapy   The patient had cisplatin plus gemzar   12/07/2018 Imaging   1. Decreased size of lateral right neck mass. 2. Unchanged soft tissue nodule in the suprasternal notch. 3. No evidence of new metastatic disease in the neck.     05/30/2019 Imaging   CT neck No clear change or progression compared to the study of March. Overall measurements of the right lateral neck mass are similar, approximately 3 x 1.8 cm. See above discussion. One could argue that there is slight increase in lateral bulging, possibly with an increase in contrast enhancement, towards the inferior margin. This is of questionable validity but could possibly represent some progression or inflammatory change. Other findings in the region are stable.   07/20/2019 - 09/15/2019 Chemotherapy   The patient had dexamethasone (DECADRON) 4 MG tablet, 1 of 1 cycle, Start date: --, End date: -- palonosetron (ALOXI) injection 0.25 mg, 0.25 mg, Intravenous,  Once, 3 of 4 cycles Administration: 0.25 mg (07/20/2019), 0.25 mg (08/10/2019), 0.25 mg (09/08/2019) CISplatin (PLATINOL) 84 mg in sodium chloride 0.9 % 250 mL chemo infusion, 40 mg/m2 = 84 mg (80 % of original dose 50 mg/m2), Intravenous,  Once, 3 of 4 cycles Dose modification: 40 mg/m2 (80 % of original dose 50 mg/m2, Cycle 1, Reason: Dose Not Tolerated) Administration: 84 mg (07/20/2019), 84 mg (08/10/2019), 83 mg (09/08/2019) gemcitabine (GEMZAR) 1,600 mg in sodium chloride 0.9 % 250 mL chemo infusion, 1,672 mg (80 % of  original dose 1,000 mg/m2), Intravenous,  Once, 3 of 4 cycles Dose modification: 800 mg/m2 (80 % of original dose 1,000 mg/m2, Cycle 1, Reason: Provider Judgment) Administration: 1,600 mg (07/20/2019), 1,600 mg (07/27/2019), 1,600 mg (08/10/2019), 1,600 mg (08/17/2019), 1,600 mg (09/08/2019), 1,672 mg (09/15/2019) ondansetron (ZOFRAN) 8 mg, dexamethasone (DECADRON) 10 mg in sodium chloride 0.9 % 50 mL IVPB, , Intravenous,  Once, 3 of 4 cycles Administration:  (09/15/2019) fosaprepitant (EMEND) 150 mg, dexamethasone (DECADRON) 12 mg in sodium chloride 0.9 % 145 mL IVPB, , Intravenous,  Once, 3 of 4 cycles Administration:  (07/20/2019),  (08/10/2019),  (09/08/2019)  for chemotherapy treatment.    10/02/2019 Imaging   CT neck As compared to 05/30/2019, no significant interval change in size of an ill-defined mass within the right lateral neck, again measuring 3.3 x 1.8 cm in transaxial dimensions.   Unchanged mildly enlarged left level I lymph node measuring 1.1 cm in short axis.   Unchanged node or nodule at the thoracic inlet, measuring 1.3 x 0.8 cm.   Please refer to concurrently performed chest CT for a description of findings below the level of the thoracic inlet.     10/02/2019 Imaging   CT chest 1. No new or progressive findings in the chest to suggest metastatic disease. 2. Bilateral subcentimeter solid pulmonary nodules are stable since 2018. 3. Hyperdense 1.1 cm anterior liver focus, not clearly visualized on prior studies. Suggest MRI abdomen without and with IV contrast for further characterization.   10/20/2019 - 12/29/2019 Chemotherapy   The patient had ondansetron (ZOFRAN) injection 8 mg, 8 mg (100 % of original dose 8 mg), Intravenous,  Once, 2 of 5 cycles Dose  modification: 8 mg (original dose 8 mg, Cycle 2) Administration: 8 mg (11/17/2019), 8 mg (12/15/2019), 8 mg (12/29/2019) gemcitabine (GEMZAR) 2,000 mg in sodium chloride 0.9 % 250 mL chemo infusion, 2,090 mg, Intravenous,   Once, 3 of 6 cycles Administration: 2,000 mg (10/20/2019), 2,000 mg (11/03/2019), 2,000 mg (11/17/2019), 2,000 mg (12/15/2019), 2,000 mg (12/29/2019)  for chemotherapy treatment.    06/12/2020 Imaging   1. Enlarging superficial, exophytic component of the chronic right sternocleidomastoid muscle mass. See series 6, image 55. 2. Elsewhere stable CT appearance of the Neck.   06/12/2020 Imaging   Post treatment scarring in the left hemithorax, stable. No evidence recurrent or metastatic disease   06/14/2020 - 10/15/2020 Chemotherapy   He received carboplatin, 5FU and Beryle Flock       10/31/2020 Procedure   Interval improvement in right lateral lymph node mass. Improvement in dermal component as well as invasion of the right sternocleidomastoid muscle.   10 mm submental lymph node slightly enlarged compared to the prior study. Continued follow-up recommended.   11/01/2020 - 05/22/2022 Chemotherapy   Patient is on Treatment Plan : HEAD/NECK Pembrolizumab Q21D     07/03/2022 -  Chemotherapy   Patient is on Treatment Plan : Head and neck Pembrolizumab (400) q42d       PHYSICAL EXAMINATION: ECOG PERFORMANCE STATUS: 1 - Symptomatic but completely ambulatory  Vitals:   05/22/22 1242  BP: (!) 147/82  Pulse: 77  Resp: 18  SpO2: 100%   Filed Weights   05/22/22 1242  Weight: 183 lb 3.2 oz (83.1 kg)    GENERAL:alert, no distress and comfortable NEURO: alert & oriented x 3 with fluent speech, no focal motor/sensory deficits  LABORATORY DATA:  I have reviewed the data as listed    Component Value Date/Time   NA 137 05/22/2022 1213   NA 139 09/22/2017 0829   K 3.4 (L) 05/22/2022 1213   K 3.5 09/22/2017 0829   CL 103 05/22/2022 1213   CO2 29 05/22/2022 1213   CO2 26 09/22/2017 0829   GLUCOSE 132 (H) 05/22/2022 1213   GLUCOSE 133 09/22/2017 0829   BUN 14 05/22/2022 1213   BUN 14.1 09/22/2017 0829   CREATININE 0.97 05/22/2022 1213   CREATININE 0.9 09/22/2017 0829   CALCIUM 8.9 05/22/2022  1213   CALCIUM 9.1 09/22/2017 0829   PROT 7.4 05/22/2022 1213   PROT 6.8 09/22/2017 0829   ALBUMIN 4.4 05/22/2022 1213   ALBUMIN 4.1 09/22/2017 0829   AST 28 05/22/2022 1213   AST 22 09/22/2017 0829   ALT 25 05/22/2022 1213   ALT 30 09/22/2017 0829   ALKPHOS 54 05/22/2022 1213   ALKPHOS 55 09/22/2017 0829   BILITOT 0.5 05/22/2022 1213   BILITOT 0.35 09/22/2017 0829   GFRNONAA >60 05/22/2022 1213   GFRAA >60 07/05/2020 0845   GFRAA >60 02/06/2019 1215    No results found for: "SPEP", "UPEP"  Lab Results  Component Value Date   WBC 5.3 05/22/2022   NEUTROABS 3.2 05/22/2022   HGB 13.4 05/22/2022   HCT 38.3 (L) 05/22/2022   MCV 85.7 05/22/2022   PLT 214 05/22/2022      Chemistry      Component Value Date/Time   NA 137 05/22/2022 1213   NA 139 09/22/2017 0829   K 3.4 (L) 05/22/2022 1213   K 3.5 09/22/2017 0829   CL 103 05/22/2022 1213   CO2 29 05/22/2022 1213   CO2 26 09/22/2017 0829   BUN 14 05/22/2022 1213  BUN 14.1 09/22/2017 0829   CREATININE 0.97 05/22/2022 1213   CREATININE 0.9 09/22/2017 0829      Component Value Date/Time   CALCIUM 8.9 05/22/2022 1213   CALCIUM 9.1 09/22/2017 0829   ALKPHOS 54 05/22/2022 1213   ALKPHOS 55 09/22/2017 0829   AST 28 05/22/2022 1213   AST 22 09/22/2017 0829   ALT 25 05/22/2022 1213   ALT 30 09/22/2017 0829   BILITOT 0.5 05/22/2022 1213   BILITOT 0.35 09/22/2017 0829

## 2022-05-22 NOTE — Assessment & Plan Note (Signed)
He has intermittent nausea He has Zofran to take as needed I recommend aggressive laxative therapy

## 2022-05-23 ENCOUNTER — Other Ambulatory Visit: Payer: Self-pay

## 2022-05-27 ENCOUNTER — Other Ambulatory Visit: Payer: Self-pay

## 2022-06-01 ENCOUNTER — Telehealth: Payer: Self-pay

## 2022-06-01 NOTE — Telephone Encounter (Signed)
Returned his call. The last 2 days he has been having more pain in neck at the old radiation site. He is concerned that he is taking the methadone and will become addicted. He is not taking Oxycodone Rx. He is only taking 5 mg of Methadone BID.  Encouraged him to keep his neck warm to see if that would help with pain. Told him I would call him back tomorrow.

## 2022-06-02 ENCOUNTER — Encounter: Payer: Self-pay | Admitting: Hematology and Oncology

## 2022-06-02 NOTE — Telephone Encounter (Signed)
I told him he needs to take it He will not get addicted

## 2022-06-02 NOTE — Telephone Encounter (Signed)
Called and given below message. He verbalized understanding. 

## 2022-06-05 ENCOUNTER — Other Ambulatory Visit: Payer: Self-pay

## 2022-06-13 ENCOUNTER — Other Ambulatory Visit: Payer: Self-pay

## 2022-06-18 ENCOUNTER — Other Ambulatory Visit: Payer: Self-pay

## 2022-06-26 ENCOUNTER — Telehealth: Payer: Self-pay

## 2022-06-26 ENCOUNTER — Other Ambulatory Visit (HOSPITAL_BASED_OUTPATIENT_CLINIC_OR_DEPARTMENT_OTHER): Payer: Self-pay

## 2022-06-26 ENCOUNTER — Encounter: Payer: Self-pay | Admitting: Hematology and Oncology

## 2022-06-26 ENCOUNTER — Telehealth: Payer: Self-pay | Admitting: *Deleted

## 2022-06-26 ENCOUNTER — Other Ambulatory Visit: Payer: Self-pay

## 2022-06-26 ENCOUNTER — Other Ambulatory Visit: Payer: Self-pay | Admitting: Hematology and Oncology

## 2022-06-26 MED ORDER — METFORMIN HCL 500 MG PO TABS
500.0000 mg | ORAL_TABLET | Freq: Two times a day (BID) | ORAL | 2 refills | Status: DC
  Filled 2022-06-26: qty 60, 30d supply, fill #0
  Filled 2022-07-30: qty 60, 30d supply, fill #1

## 2022-06-26 MED ORDER — METHADONE HCL 10 MG PO TABS
10.0000 mg | ORAL_TABLET | Freq: Two times a day (BID) | ORAL | 0 refills | Status: DC
Start: 2022-06-26 — End: 2022-07-30
  Filled 2022-06-26: qty 60, 30d supply, fill #0

## 2022-06-26 NOTE — Telephone Encounter (Signed)
He called and requested Methadone refill to [facility].

## 2022-06-26 NOTE — Telephone Encounter (Signed)
done

## 2022-06-26 NOTE — Telephone Encounter (Signed)
Called and told him RX sent. Sent Metformin Rx to pharmacy. He verbalized understanding and will pick up Rx.

## 2022-06-26 NOTE — Telephone Encounter (Signed)
Methadone 10 mg Medication Prior Authorization Status.  Processed CoverMyMeds KEY: [id number].  Approved Today.  Per [facility] [place] Case ID: [id number].   Effective 9/55/2023 through 09/24/2022.

## 2022-06-29 ENCOUNTER — Other Ambulatory Visit (HOSPITAL_BASED_OUTPATIENT_CLINIC_OR_DEPARTMENT_OTHER): Payer: Self-pay

## 2022-07-03 ENCOUNTER — Ambulatory Visit: Payer: Medicaid Other

## 2022-07-03 ENCOUNTER — Inpatient Hospital Stay: Payer: Medicaid Other | Attending: Hematology and Oncology | Admitting: Hematology and Oncology

## 2022-07-03 ENCOUNTER — Other Ambulatory Visit: Payer: Medicaid Other

## 2022-07-03 ENCOUNTER — Other Ambulatory Visit (HOSPITAL_COMMUNITY): Payer: Self-pay

## 2022-07-03 ENCOUNTER — Ambulatory Visit: Payer: Medicaid Other | Admitting: Hematology and Oncology

## 2022-07-03 ENCOUNTER — Inpatient Hospital Stay: Payer: Medicaid Other

## 2022-07-03 DIAGNOSIS — C760 Malignant neoplasm of head, face and neck: Secondary | ICD-10-CM | POA: Diagnosis not present

## 2022-07-03 DIAGNOSIS — Z5112 Encounter for antineoplastic immunotherapy: Secondary | ICD-10-CM | POA: Insufficient documentation

## 2022-07-03 DIAGNOSIS — Z7189 Other specified counseling: Secondary | ICD-10-CM

## 2022-07-03 DIAGNOSIS — E039 Hypothyroidism, unspecified: Secondary | ICD-10-CM

## 2022-07-03 DIAGNOSIS — C119 Malignant neoplasm of nasopharynx, unspecified: Secondary | ICD-10-CM | POA: Insufficient documentation

## 2022-07-03 DIAGNOSIS — Z79899 Other long term (current) drug therapy: Secondary | ICD-10-CM | POA: Insufficient documentation

## 2022-07-03 DIAGNOSIS — G893 Neoplasm related pain (acute) (chronic): Secondary | ICD-10-CM | POA: Diagnosis not present

## 2022-07-03 DIAGNOSIS — Z95828 Presence of other vascular implants and grafts: Secondary | ICD-10-CM

## 2022-07-03 DIAGNOSIS — C7800 Secondary malignant neoplasm of unspecified lung: Secondary | ICD-10-CM

## 2022-07-03 DIAGNOSIS — E119 Type 2 diabetes mellitus without complications: Secondary | ICD-10-CM

## 2022-07-03 LAB — CBC WITH DIFFERENTIAL (CANCER CENTER ONLY)
Abs Immature Granulocytes: 0 10*3/uL (ref 0.00–0.07)
Basophils Absolute: 0 10*3/uL (ref 0.0–0.1)
Basophils Relative: 0 %
Eosinophils Absolute: 0.2 10*3/uL (ref 0.0–0.5)
Eosinophils Relative: 3 %
HCT: 38.3 % — ABNORMAL LOW (ref 39.0–52.0)
Hemoglobin: 13.1 g/dL (ref 13.0–17.0)
Immature Granulocytes: 0 %
Lymphocytes Relative: 34 %
Lymphs Abs: 1.7 10*3/uL (ref 0.7–4.0)
MCH: 29.3 pg (ref 26.0–34.0)
MCHC: 34.2 g/dL (ref 30.0–36.0)
MCV: 85.7 fL (ref 80.0–100.0)
Monocytes Absolute: 0.4 10*3/uL (ref 0.1–1.0)
Monocytes Relative: 8 %
Neutro Abs: 2.7 10*3/uL (ref 1.7–7.7)
Neutrophils Relative %: 55 %
Platelet Count: 240 10*3/uL (ref 150–400)
RBC: 4.47 MIL/uL (ref 4.22–5.81)
RDW: 13.2 % (ref 11.5–15.5)
WBC Count: 4.9 10*3/uL (ref 4.0–10.5)
nRBC: 0 % (ref 0.0–0.2)

## 2022-07-03 LAB — TSH: TSH: 2.076 u[IU]/mL (ref 0.350–4.500)

## 2022-07-03 LAB — CMP (CANCER CENTER ONLY)
ALT: 17 U/L (ref 0–44)
AST: 17 U/L (ref 15–41)
Albumin: 4.4 g/dL (ref 3.5–5.0)
Alkaline Phosphatase: 65 U/L (ref 38–126)
Anion gap: 6 (ref 5–15)
BUN: 17 mg/dL (ref 6–20)
CO2: 30 mmol/L (ref 22–32)
Calcium: 9.1 mg/dL (ref 8.9–10.3)
Chloride: 102 mmol/L (ref 98–111)
Creatinine: 0.73 mg/dL (ref 0.61–1.24)
GFR, Estimated: >60 mL/min (ref >60)
Glucose, Bld: 154 mg/dL — ABNORMAL HIGH (ref 70–99)
Potassium: 3.5 mmol/L (ref 3.5–5.1)
Sodium: 138 mmol/L (ref 135–145)
Total Bilirubin: 0.3 mg/dL (ref 0.3–1.2)
Total Protein: 7.2 g/dL (ref 6.5–8.1)

## 2022-07-03 MED ORDER — SODIUM CHLORIDE 0.9 % IV SOLN: 8.0000 mg | Freq: Once | INTRAVENOUS | Status: DC

## 2022-07-03 MED ORDER — SODIUM CHLORIDE 0.9 % IV SOLN: Freq: Once | INTRAVENOUS | Status: AC

## 2022-07-03 MED ORDER — SODIUM CHLORIDE 0.9 % IV SOLN
400.0000 mg | Freq: Once | INTRAVENOUS | Status: AC
  Administered 2022-07-03: 400 mg via INTRAVENOUS
  Filled 2022-07-03: qty 16

## 2022-07-03 MED ORDER — ONDANSETRON HCL 4 MG/2ML IJ SOLN
8.0000 mg | Freq: Once | INTRAMUSCULAR | Status: AC
  Administered 2022-07-03: 8 mg via INTRAVENOUS
  Filled 2022-07-03: qty 4

## 2022-07-03 MED ORDER — SODIUM CHLORIDE 0.9% FLUSH
10.0000 mL | INTRAVENOUS | Status: DC | PRN
  Administered 2022-07-03: 10 mL

## 2022-07-03 MED ORDER — OXYCODONE HCL 5 MG PO TABS
5.0000 mg | ORAL_TABLET | Freq: Four times a day (QID) | ORAL | 0 refills | Status: DC | PRN
  Filled 2022-07-03: qty 90, 23d supply, fill #0

## 2022-07-03 MED ORDER — SODIUM CHLORIDE 0.9% FLUSH
10.0000 mL | Freq: Once | INTRAVENOUS | Status: AC | PRN
  Administered 2022-07-03: 10 mL

## 2022-07-03 NOTE — Patient Instructions (Signed)
[facility]  Discharge Instructions: Thank you for choosing [facility] to provide your oncology and hematology care.   If you have a lab appointment with the [facility], please go directly to the [facility] and check in at the registration area.   Wear comfortable clothing and clothing appropriate for easy access to any Portacath or PICC line.   We strive to give you quality time with your provider. You may need to reschedule your appointment if you arrive late (15 or more minutes).  Arriving late affects you and other patients whose appointments are after yours.  Also, if you miss three or more appointments without notifying the office, you may be dismissed from the clinic at the provider's discretion.      For prescription refill requests, have your pharmacy contact our office and allow 72 hours for refills to be completed.    Today you received the following chemotherapy and/or immunotherapy agents: Keytruda.       To help prevent nausea and vomiting after your treatment, we encourage you to take your nausea medication as directed.  BELOW ARE SYMPTOMS THAT SHOULD BE REPORTED IMMEDIATELY: *FEVER GREATER THAN 100.4 F (38 C) OR HIGHER *CHILLS OR SWEATING *NAUSEA AND VOMITING THAT IS NOT CONTROLLED WITH YOUR NAUSEA MEDICATION *UNUSUAL SHORTNESS OF BREATH *UNUSUAL BRUISING OR BLEEDING *URINARY PROBLEMS (pain or burning when urinating, or frequent urination) *BOWEL PROBLEMS (unusual diarrhea, constipation, pain near the anus) TENDERNESS IN MOUTH AND THROAT WITH OR WITHOUT PRESENCE OF ULCERS (sore throat, sores in mouth, or a toothache) UNUSUAL RASH, SWELLING OR PAIN  UNUSUAL VAGINAL DISCHARGE OR ITCHING   Items with * indicate a potential emergency and should be followed up as soon as possible or go to the Emergency Department if any problems should occur.  Please show the CHEMOTHERAPY ALERT CARD or IMMUNOTHERAPY ALERT CARD at check-in to  the Emergency Department and triage nurse.  Should you have questions after your visit or need to cancel or reschedule your appointment, please contact [facility]  Dept: [phone]  and follow the prompts.  Office hours are 8:00 a.m. to 4:30 p.m. Monday - Friday. Please note that voicemails left after 4:00 p.m. may not be returned until the following business day.  We are closed weekends and major holidays. You have access to a nurse at all times for urgent questions. Please call the main number to the clinic Dept: [phone] and follow the prompts.   For any non-urgent questions, you may also contact your provider using MyChart. We now offer e-Visits for anyone 57 and older to request care online for non-urgent symptoms. For details visit [email or web address].   Also download the MyChart app! Go to the app store, search "MyChart", open the app, select [facility], and log in with your MyChart username and password.  Masks are optional in the cancer centers. If you would like for your care team to wear a mask while they are taking care of you, please let them know. You may have one support person who is at least 36 years old accompany you for your appointments.

## 2022-07-04 ENCOUNTER — Other Ambulatory Visit: Payer: Self-pay

## 2022-07-04 LAB — T4: T4, Total: 6.4 ug/dL (ref 4.5–12.0)

## 2022-07-04 NOTE — Assessment & Plan Note (Signed)
His TSH is intermittently elevated He does not need dose adjustment but we will monitor that closely

## 2022-07-04 NOTE — Progress Notes (Signed)
[facility] OFFICE PROGRESS NOTE  Patient Care Team: Patient, No Pcp Per as PCP - General (General Practice) [name], MD as Consulting Physician (Hematology and Oncology) Eppie Gibson, MD as Attending Physician (Radiation Oncology) Jodi Marble, MD as Consulting Physician (Otolaryngology) Philomena Doheny, MD as Referring Physician (Plastic Surgery) Irene Shipper, MD as Consulting Physician (Gastroenterology) System, Provider Not In  ASSESSMENT & PLAN:  Malignant neoplasm of head and neck [facility]) The patient has no signs of recurrent disease in his neck His severe neck pain is related to prior treatment related fibrosis and neuropathic pain related to prior surgery He will continue treatment indefinitely with pembrolizumab I plan to repeat imaging study end of the year  Cancer associated pain His severe neck pain is related to complication from prior surgery and radiation, causing severe fibrosis and limitation of range of motion of his neck I plan to continue methadone twice daily and oxycodone 5 mg 3 times a day in the future  I reminded him about risk of constipation with both pain medicine  Acquired hypothyroidism His TSH is intermittently elevated He does not need dose adjustment but we will monitor that closely  Type 2 diabetes mellitus ([place]) He will continue his best effort to monitor his carbohydrate intake We will continue his current medication  No orders of the defined types were placed in this encounter.   All questions were answered. The patient knows to call the clinic with any problems, questions or concerns. The total time spent in the appointment was 30 minutes encounter with patients including review of chart and various tests results, discussions about plan of care and coordination of care plan   [name], MD 07/04/2022 8:12 PM  INTERVAL HISTORY: Please see below for problem oriented charting. he returns for treatment follow-up and  discussion regarding pain management He has slight worsening neck pain No new LN He is fearless of developing "addiction for narcotics"  REVIEW OF SYSTEMS:   Constitutional: Denies fevers, chills or abnormal weight loss Eyes: Denies blurriness of vision Ears, nose, mouth, throat, and face: Denies mucositis or sore throat Respiratory: Denies cough, dyspnea or wheezes Cardiovascular: Denies palpitation, chest discomfort or lower extremity swelling Gastrointestinal:  Denies nausea, heartburn or change in bowel habits Skin: Denies abnormal skin rashes Lymphatics: Denies new lymphadenopathy or easy bruising Neurological:Denies numbness, tingling or new weaknesses Behavioral/Psych: Mood is stable, no new changes  All other systems were reviewed with the patient and are negative.  I have reviewed the past medical history, past surgical history, social history and family history with the patient and they are unchanged from previous note.  ALLERGIES:  is allergic to phenergan [promethazine hcl], heparin, and clindamycin.  MEDICATIONS:  Current Outpatient Medications  Medication Sig Dispense Refill   blood glucose meter kit and supplies KIT Dispense based on patient and insurance preference. Use up to four times daily as directed. (FOR ICD-9 250.00, 250.01). 1 each 1   cetirizine (ZYRTEC) 10 MG tablet Take 1 tablet (10 mg total) by mouth daily as needed for allergies. 100 tablet 0   levothyroxine (SYNTHROID) 125 MCG tablet Take 1 tablet (125 mcg total) by mouth daily before breakfast. 60 tablet 1   metFORMIN (GLUCOPHAGE) 500 MG tablet Take 1 tablet (500 mg total) by mouth 2 (two) times daily with a meal. 60 tablet 2   methadone (DOLOPHINE) 10 MG tablet Take 1 tablet (10 mg total) by mouth every 12 (twelve) hours. 60 tablet 0   ondansetron (  ZOFRAN) 8 MG tablet Take 1 tablet (8 mg total) by mouth every 8 (eight) hours as needed for nausea or vomiting. 90 tablet 1   oxyCODONE (OXY IR/ROXICODONE) 5  MG immediate release tablet Take 1 tablet (5 mg total) by mouth every 6 (six) hours as needed for severe pain. 90 tablet 0   No current facility-administered medications for this visit.   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 Pan-endoscopy elsewhere showed cancer from nasopharyngeal space.   01/04/2007 - 02/20/2007 Chemotherapy   He received 2 cycles of cisplatin and 5FU followed by concurrent chemo with weekly cisplatin and radiation. He only received 2 doses of chemo due to severe mucositis, nausea and weight loss.   04/05/2007 - 08/04/2007 Chemotherapy   He received 4 more courses of cisplatin with 5FU and had complete response   07/05/2009 Procedure   Fine-needle aspirate of the right level II lymph nodes come from recurrent metastatic disease. Repeat endoscopy and CT scan show no evidence of disease elsewhere.   07/08/2009 - 12/02/2009 Chemotherapy   He was given 6 cycles of carboplatin, 5-FU and docetaxel   12/03/2009 Surgery   He has surgery to the residual lymph node on the right neck which showed no evidence of disease.   02/22/2012 Imaging   Repeat imaging study showed large recurrent mass. He  was referred elsewhere for further treatment.   05/03/2012 Surgery   He underwent left upper lobectomy.   04/29/2013 Imaging   PEt scan showed lesion on right level II B and lower lung was abnormal   06/03/2013 - 02/02/2014 Chemotherapy   He had 6 cycles of chemotherapy when he was found to have recurrence of cancer and had received oxaliplatin and capecitabine   06/07/2014 Imaging   PET CT scan showed persistent disease in the right neck lymph nodes and left lung   06/29/2014 Procedure   Accession: [id number] repeat LUL biopsy confirmed metastatic cancer   07/18/2014 - 07/31/2014 Radiation Therapy   He received palliative radiation therapy to the lungs   10/10/2014 Imaging   CT scan of the chest, abdomen and pelvis show regression in the size of the lung nodule in the left upper lobe and stable pulmonary nodules   01/24/2015 Imaging   CT scan showed stable disease in neck and lung   06/19/2015 Imaging   CT scan of the neck and the chest show possible mild progression of the nodule in the right side of the neck.   06/25/2015 Imaging   PET scan confirmed disease recurrence in the neck   07/07/2015 Imaging   He had MRI neck at [facility]  09/03/2015 - 08/26/2018 Chemotherapy   He received palliative chemo with Nivolumab   10/29/2015 Imaging   PET CT showed positive response to Rx   02/28/2016 Imaging   Ct abdomen showed abnormal thinkening in his stomach   03/03/2016 Imaging   CT: Right sternocleidomastoid muscle metastasis appears less distinct but otherwise not significantly changed in size or configuration since 06/19/2015.2. Left level 3 lymph node which was hypermetabolic by PET-CT in January 2017 appears slightly smaller   04/01/2016 Imaging   CT cervical spine showed no acute fracture or traumatic malalignment in the cervical spine   04/22/2016 Procedure   Port-a-cath placed.   06/16/2016 Imaging   Ct neck showed right sternocleidomastoid muscle metastasis is further decreased in  conspicuity since May, and has mildly decreased in size since September 2016. Continued stability of sub-centimeter left cervical lymph nodes. No new or progressive metastatic disease in the neck.   06/16/2016 Imaging   CT chest showed stable masslike radiation fibrosis in the left upper lobe. Stable subcentimeter pulmonary nodules in the bilateral lower lobes. No new or progressive metastatic disease in the chest. Nonobstructing left renal stone.   10/13/2016 Imaging   Ct neck showed unchanged right sternocleidomastoid muscle metastasis. Unchanged subcentimeter left cervical lymph nodes. No evidence of new or progressive metastatic disease in the neck.   10/13/2016 Imaging   CT chest showed tiny hypervascular foci in the liver, not definitely seen on prior imaging of 06/16/2016 and 02/28/2016. Abdomen MRI without and with contrast recommended to further evaluate as metastatic disease is a concern. 2. Stable appearance of post treatment changes left upper lung and scattered tiny bilateral pulmonary nodules.   02/11/2017 Imaging   Ct neck: Lymph node mass right posterior neck appears improved from the prior study. Small posterior lymph nodes on the left unchanged. Occluded right jugular vein unchanged.   02/11/2017 Imaging   1. Similar appearance of postsurgical and radiation changes in the left upper lobe. 2. Similar bilateral pulmonary nodules. 3. No thoracic adenopathy. 4. Subtle foci of post-contrast enhancement within the liver are suboptimally characterized on this nondedicated study. Likely similar. These could either be re-evaluated at followup or more entirely characterized with abdominal MRI. 5. Left nephrolithiasis.   05/19/2017 Imaging   Matted lymph node mass right posterior neck appears larger in the recent CT. Accurate measurements difficult due to infiltrating tumor margins and infiltration of the muscle. Right jugular vein again appears occluded or resected. Small left posterior lymph  nodes stable. Left upper lobe airspace density stable and similar to the prior CT   06/03/2017 PET scan   1. Hypermetabolic ill-defined right level IIb lymph node, about 1.3 cm in diameter with maximum SUV 9.5 (formerly 8.1). Appearance suspicious for residual/recurrent malignancy. No worrisome left-sided lesion. 2. Left suprahilar indistinct opacity demonstrates no worrisome hypermetabolic activity. The 5 mm left lower lobe pulmonary nodule is stable and not currently hypermetabolic although below sensitive PET-CT size thresholds. 3. Other imaging findings of potential clinical significance: Bilateral nonobstructive nephrolithiasis. Chronic bilateral maxillary sinusitis.   05/11/2018 PET scan   1. Continued chronic accentuated metabolic activity in the vicinity of right level IIB and the adjacent right sternocleidomastoid muscle, with ill definition of surrounding tissue planes. Maximum SUV is currently 8.1, formerly 9.5. Accentuated metabolic activity is been present in this vicinity back through 06/25/2015, and there was also some low-level activity in this vicinity on 06/07/2014. Some of this may be from scarring and local muscular activity although clearly a component of residual tumor  is difficult to exclude given the focally high activity. 2. Other imaging findings of potential clinical significance: Chronic bilateral maxillary sinusitis. Chronic scarring in the left upper lobe. Chronically stable 5 mm left lower lobe nodule is considered benign. Nonobstructive left nephrolithiasis.   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 COMMENT:No significant keratinization is identified. Other basaloid carcinomas are in the differential diagnosis. No cell block material is available for further testing.   09/12/2018 Procedure   He underwent fine Needle  Aspiration   10/04/2018 PET scan   1. Significant progression of local recurrence laterally in the mid right neck with an enlarging, increasingly hypermetabolic soft tissue mass. This involves the right sternocleidomastoid muscle. 2. Small lymph nodes in the right axilla are increasingly hypermetabolic. These are nonspecific and potentially reactive, although could reflect a small metastases. Small hypermetabolic nodule in the left suprasternal notch is unchanged. 3. No other evidence of metastatic disease.     10/07/2018 - 12/23/2018 Chemotherapy   The patient had cisplatin plus gemzar   12/07/2018 Imaging   1. Decreased size of lateral right neck mass. 2. Unchanged soft tissue nodule in the suprasternal notch. 3. No evidence of new metastatic disease in the neck.     05/30/2019 Imaging   CT neck No clear change or progression compared to the study of March. Overall measurements of the right lateral neck mass are similar, approximately 3 x 1.8 cm. See above discussion. One could argue that there is slight increase in lateral bulging, possibly with an increase in contrast enhancement, towards the inferior margin. This is of questionable validity but could possibly represent some progression or inflammatory change. Other findings in the region are stable.   07/20/2019 - 09/15/2019 Chemotherapy   The patient had dexamethasone (DECADRON) 4 MG tablet, 1 of 1 cycle, Start date: --, End date: -- palonosetron (ALOXI) injection 0.25 mg, 0.25 mg, Intravenous,  Once, 3 of 4 cycles Administration: 0.25 mg (07/20/2019), 0.25 mg (08/10/2019), 0.25 mg (09/08/2019) CISplatin (PLATINOL) 84 mg in sodium chloride 0.9 % 250 mL chemo infusion, 40 mg/m2 = 84 mg (80 % of original dose 50 mg/m2), Intravenous,  Once, 3 of 4 cycles Dose modification: 40 mg/m2 (80 % of original dose 50 mg/m2, Cycle 1, Reason: Dose Not Tolerated) Administration: 84 mg (07/20/2019), 84 mg (08/10/2019), 83 mg (09/08/2019) gemcitabine (GEMZAR)  1,600 mg in sodium chloride 0.9 % 250 mL chemo infusion, 1,672 mg (80 % of original dose 1,000 mg/m2), Intravenous,  Once, 3 of 4 cycles Dose modification: 800 mg/m2 (80 % of original dose 1,000 mg/m2, Cycle 1, Reason: Provider Judgment) Administration: 1,600 mg (07/20/2019), 1,600 mg (07/27/2019), 1,600 mg (08/10/2019), 1,600 mg (08/17/2019), 1,600 mg (09/08/2019), 1,672 mg (09/15/2019) ondansetron (ZOFRAN) 8 mg, dexamethasone (DECADRON) 10 mg in sodium chloride 0.9 % 50 mL IVPB, , Intravenous,  Once, 3 of 4 cycles Administration:  (09/15/2019) fosaprepitant (EMEND) 150 mg, dexamethasone (DECADRON) 12 mg in sodium chloride 0.9 % 145 mL IVPB, , Intravenous,  Once, 3 of 4 cycles Administration:  (07/20/2019),  (08/10/2019),  (09/08/2019)  for chemotherapy treatment.    10/02/2019 Imaging   CT neck As compared to 05/30/2019, no significant interval change in size of an ill-defined mass within the right lateral neck, again measuring 3.3 x 1.8 cm in transaxial dimensions.   Unchanged mildly enlarged left level I lymph node measuring 1.1 cm in  short axis.   Unchanged node or nodule at the thoracic inlet, measuring 1.3 x 0.8 cm.   Please refer to concurrently performed chest CT for a description of findings below the level of the thoracic inlet.     10/02/2019 Imaging   CT chest 1. No new or progressive findings in the chest to suggest metastatic disease. 2. Bilateral subcentimeter solid pulmonary nodules are stable since 2018. 3. Hyperdense 1.1 cm anterior liver focus, not clearly visualized on prior studies. Suggest MRI abdomen without and with IV contrast for further characterization.   10/20/2019 - 12/29/2019 Chemotherapy   The patient had ondansetron (ZOFRAN) injection 8 mg, 8 mg (100 % of original dose 8 mg), Intravenous,  Once, 2 of 5 cycles Dose modification: 8 mg (original dose 8 mg, Cycle 2) Administration: 8 mg (11/17/2019), 8 mg (12/15/2019), 8 mg (12/29/2019) gemcitabine (GEMZAR) 2,000 mg  in sodium chloride 0.9 % 250 mL chemo infusion, 2,090 mg, Intravenous,  Once, 3 of 6 cycles Administration: 2,000 mg (10/20/2019), 2,000 mg (11/03/2019), 2,000 mg (11/17/2019), 2,000 mg (12/15/2019), 2,000 mg (12/29/2019)  for chemotherapy treatment.    06/12/2020 Imaging   1. Enlarging superficial, exophytic component of the chronic right sternocleidomastoid muscle mass. See series 6, image 55. 2. Elsewhere stable CT appearance of the Neck.   06/12/2020 Imaging   Post treatment scarring in the left hemithorax, stable. No evidence recurrent or metastatic disease   06/14/2020 - 10/15/2020 Chemotherapy   He received carboplatin, 5FU and Beryle Flock       10/31/2020 Procedure   Interval improvement in right lateral lymph node mass. Improvement in dermal component as well as invasion of the right sternocleidomastoid muscle.   10 mm submental lymph node slightly enlarged compared to the prior study. Continued follow-up recommended.   11/01/2020 - 05/22/2022 Chemotherapy   Patient is on Treatment Plan : HEAD/NECK Pembrolizumab Q21D     07/03/2022 -  Chemotherapy   Patient is on Treatment Plan : Head and neck Pembrolizumab (400) q42d       PHYSICAL EXAMINATION: ECOG PERFORMANCE STATUS: 1 - Symptomatic but completely ambulatory  Vitals:   07/03/22 1002  BP: (!) 150/99  Pulse: 71  Resp: 18  Temp: 98.5 F (36.9 C)  SpO2: 100%   Filed Weights   07/03/22 1002  Weight: 188 lb 6.4 oz (85.5 kg)    GENERAL:alert, no distress and comfortable NECK: well healed surgical scar and significant fibrosis is noted  NEURO: alert & oriented x 3 with fluent speech, no focal motor/sensory deficits  LABORATORY DATA:  I have reviewed the data as listed    Component Value Date/Time   NA 138 07/03/2022 0946   NA 139 09/22/2017 0829   K 3.5 07/03/2022 0946   K 3.5 09/22/2017 0829   CL 102 07/03/2022 0946   CO2 30 07/03/2022 0946   CO2 26 09/22/2017 0829   GLUCOSE 154 (H) 07/03/2022 0946   GLUCOSE 133  09/22/2017 0829   BUN 17 07/03/2022 0946   BUN 14.1 09/22/2017 0829   CREATININE 0.73 07/03/2022 0946   CREATININE 0.9 09/22/2017 0829   CALCIUM 9.1 07/03/2022 0946   CALCIUM 9.1 09/22/2017 0829   PROT 7.2 07/03/2022 0946   PROT 6.8 09/22/2017 0829   ALBUMIN 4.4 07/03/2022 0946   ALBUMIN 4.1 09/22/2017 0829   AST 17 07/03/2022 0946   AST 22 09/22/2017 0829   ALT 17 07/03/2022 0946   ALT 30 09/22/2017 0829   ALKPHOS 65 07/03/2022 0946   ALKPHOS 55  09/22/2017 0829   BILITOT 0.3 07/03/2022 0946   BILITOT 0.35 09/22/2017 0829   GFRNONAA >60 07/03/2022 0946   GFRAA >60 07/05/2020 0845   GFRAA >60 02/06/2019 1215    No results found for: "SPEP", "UPEP"  Lab Results  Component Value Date   WBC 4.9 07/03/2022   NEUTROABS 2.7 07/03/2022   HGB 13.1 07/03/2022   HCT 38.3 (L) 07/03/2022   MCV 85.7 07/03/2022   PLT 240 07/03/2022      Chemistry      Component Value Date/Time   NA 138 07/03/2022 0946   NA 139 09/22/2017 0829   K 3.5 07/03/2022 0946   K 3.5 09/22/2017 0829   CL 102 07/03/2022 0946   CO2 30 07/03/2022 0946   CO2 26 09/22/2017 0829   BUN 17 07/03/2022 0946   BUN 14.1 09/22/2017 0829   CREATININE 0.73 07/03/2022 0946   CREATININE 0.9 09/22/2017 0829      Component Value Date/Time   CALCIUM 9.1 07/03/2022 0946   CALCIUM 9.1 09/22/2017 0829   ALKPHOS 65 07/03/2022 0946   ALKPHOS 55 09/22/2017 0829   AST 17 07/03/2022 0946   AST 22 09/22/2017 0829   ALT 17 07/03/2022 0946   ALT 30 09/22/2017 0829   BILITOT 0.3 07/03/2022 0946   BILITOT 0.35 09/22/2017 0829

## 2022-07-04 NOTE — Assessment & Plan Note (Signed)
He will continue his best effort to monitor his carbohydrate intake We will continue his current medication

## 2022-07-04 NOTE — Assessment & Plan Note (Signed)
The patient has no signs of recurrent disease in his neck His severe neck pain is related to prior treatment related fibrosis and neuropathic pain related to prior surgery He will continue treatment indefinitely with pembrolizumab I plan to repeat imaging study end of the year

## 2022-07-04 NOTE — Assessment & Plan Note (Signed)
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

## 2022-07-05 ENCOUNTER — Encounter: Payer: Self-pay | Admitting: Hematology and Oncology

## 2022-07-05 ENCOUNTER — Other Ambulatory Visit: Payer: Self-pay

## 2022-07-06 ENCOUNTER — Other Ambulatory Visit: Payer: Self-pay

## 2022-07-30 ENCOUNTER — Other Ambulatory Visit (HOSPITAL_BASED_OUTPATIENT_CLINIC_OR_DEPARTMENT_OTHER): Payer: Self-pay

## 2022-07-30 ENCOUNTER — Other Ambulatory Visit: Payer: Self-pay | Admitting: Hematology and Oncology

## 2022-07-30 MED ORDER — LEVOTHYROXINE SODIUM 125 MCG PO TABS
125.0000 ug | ORAL_TABLET | Freq: Every day | ORAL | 1 refills | Status: DC
  Filled 2022-07-30: qty 60, 60d supply, fill #0

## 2022-07-30 MED ORDER — METHADONE HCL 10 MG PO TABS
10.0000 mg | ORAL_TABLET | Freq: Two times a day (BID) | ORAL | 0 refills | Status: DC
  Filled 2022-07-30: qty 60, 30d supply, fill #0

## 2022-07-31 ENCOUNTER — Other Ambulatory Visit (HOSPITAL_BASED_OUTPATIENT_CLINIC_OR_DEPARTMENT_OTHER): Payer: Self-pay

## 2022-08-03 ENCOUNTER — Other Ambulatory Visit (HOSPITAL_BASED_OUTPATIENT_CLINIC_OR_DEPARTMENT_OTHER): Payer: Self-pay

## 2022-08-07 ENCOUNTER — Other Ambulatory Visit (HOSPITAL_BASED_OUTPATIENT_CLINIC_OR_DEPARTMENT_OTHER): Payer: Self-pay

## 2022-08-07 ENCOUNTER — Other Ambulatory Visit: Payer: Self-pay | Admitting: Hematology and Oncology

## 2022-08-07 ENCOUNTER — Telehealth: Payer: Self-pay

## 2022-08-07 MED ORDER — OXYCODONE HCL 5 MG PO TABS
5.0000 mg | ORAL_TABLET | Freq: Four times a day (QID) | ORAL | 0 refills | Status: DC | PRN
  Filled 2022-08-07: qty 90, 23d supply, fill #0

## 2022-08-07 NOTE — Telephone Encounter (Signed)
He is not supposed to call on Fridays I hope they have it; I refilled it

## 2022-08-07 NOTE — Telephone Encounter (Signed)
Called and told Rx sent. He verbalized understanding.

## 2022-08-07 NOTE — Telephone Encounter (Signed)
He called and left a message requesting Oxycodone refill to [facility] please.

## 2022-08-14 ENCOUNTER — Inpatient Hospital Stay: Payer: Medicaid Other | Attending: Hematology and Oncology

## 2022-08-14 ENCOUNTER — Inpatient Hospital Stay: Payer: Medicaid Other | Admitting: Hematology and Oncology

## 2022-08-14 ENCOUNTER — Encounter: Payer: Self-pay | Admitting: Hematology and Oncology

## 2022-08-14 ENCOUNTER — Inpatient Hospital Stay (HOSPITAL_BASED_OUTPATIENT_CLINIC_OR_DEPARTMENT_OTHER): Payer: Medicaid Other

## 2022-08-14 ENCOUNTER — Other Ambulatory Visit: Payer: Self-pay

## 2022-08-14 VITALS — BP 128/79 | HR 91 | Resp 18 | Ht 69.0 in | Wt 184.2 lb

## 2022-08-14 VITALS — BP 128/85 | HR 90 | Temp 98.6°F | Resp 18

## 2022-08-14 DIAGNOSIS — Z5112 Encounter for antineoplastic immunotherapy: Secondary | ICD-10-CM | POA: Insufficient documentation

## 2022-08-14 DIAGNOSIS — C119 Malignant neoplasm of nasopharynx, unspecified: Secondary | ICD-10-CM | POA: Insufficient documentation

## 2022-08-14 DIAGNOSIS — Z7189 Other specified counseling: Secondary | ICD-10-CM

## 2022-08-14 DIAGNOSIS — E039 Hypothyroidism, unspecified: Secondary | ICD-10-CM | POA: Insufficient documentation

## 2022-08-14 DIAGNOSIS — G893 Neoplasm related pain (acute) (chronic): Secondary | ICD-10-CM | POA: Diagnosis not present

## 2022-08-14 DIAGNOSIS — Z79899 Other long term (current) drug therapy: Secondary | ICD-10-CM | POA: Insufficient documentation

## 2022-08-14 DIAGNOSIS — C760 Malignant neoplasm of head, face and neck: Secondary | ICD-10-CM

## 2022-08-14 DIAGNOSIS — Z95828 Presence of other vascular implants and grafts: Secondary | ICD-10-CM

## 2022-08-14 DIAGNOSIS — C7800 Secondary malignant neoplasm of unspecified lung: Secondary | ICD-10-CM

## 2022-08-14 LAB — CBC WITH DIFFERENTIAL (CANCER CENTER ONLY)
Abs Immature Granulocytes: 0.02 10*3/uL (ref 0.00–0.07)
Basophils Absolute: 0 10*3/uL (ref 0.0–0.1)
Basophils Relative: 1 %
Eosinophils Absolute: 0.2 10*3/uL (ref 0.0–0.5)
Eosinophils Relative: 4 %
HCT: 39.4 % (ref 39.0–52.0)
Hemoglobin: 13 g/dL (ref 13.0–17.0)
Immature Granulocytes: 0 %
Lymphocytes Relative: 29 %
Lymphs Abs: 1.8 10*3/uL (ref 0.7–4.0)
MCH: 29 pg (ref 26.0–34.0)
MCHC: 33 g/dL (ref 30.0–36.0)
MCV: 87.9 fL (ref 80.0–100.0)
Monocytes Absolute: 0.3 10*3/uL (ref 0.1–1.0)
Monocytes Relative: 5 %
Neutro Abs: 3.8 10*3/uL (ref 1.7–7.7)
Neutrophils Relative %: 61 %
Platelet Count: 269 10*3/uL (ref 150–400)
RBC: 4.48 MIL/uL (ref 4.22–5.81)
RDW: 13.8 % (ref 11.5–15.5)
WBC Count: 6.2 10*3/uL (ref 4.0–10.5)
nRBC: 0 % (ref 0.0–0.2)

## 2022-08-14 LAB — CMP (CANCER CENTER ONLY)
ALT: 16 U/L (ref 0–44)
AST: 20 U/L (ref 15–41)
Albumin: 4.4 g/dL (ref 3.5–5.0)
Alkaline Phosphatase: 67 U/L (ref 38–126)
Anion gap: 9 (ref 5–15)
BUN: 17 mg/dL (ref 6–20)
CO2: 27 mmol/L (ref 22–32)
Calcium: 9.4 mg/dL (ref 8.9–10.3)
Chloride: 103 mmol/L (ref 98–111)
Creatinine: 0.99 mg/dL (ref 0.61–1.24)
GFR, Estimated: >60 mL/min (ref >60)
Glucose, Bld: 175 mg/dL — ABNORMAL HIGH (ref 70–99)
Potassium: 3.7 mmol/L (ref 3.5–5.1)
Sodium: 139 mmol/L (ref 135–145)
Total Bilirubin: 0.3 mg/dL (ref 0.3–1.2)
Total Protein: 7.6 g/dL (ref 6.5–8.1)

## 2022-08-14 LAB — TSH: TSH: 2.395 u[IU]/mL (ref 0.350–4.500)

## 2022-08-14 MED ORDER — SODIUM CHLORIDE 0.9 % IV SOLN: Freq: Once | INTRAVENOUS | Status: AC

## 2022-08-14 MED ORDER — ONDANSETRON HCL 4 MG/2ML IJ SOLN
8.0000 mg | Freq: Once | INTRAMUSCULAR | Status: AC
  Administered 2022-08-14: 8 mg via INTRAVENOUS
  Filled 2022-08-14: qty 4

## 2022-08-14 MED ORDER — SODIUM CHLORIDE 0.9 % IV SOLN
400.0000 mg | Freq: Once | INTRAVENOUS | Status: AC
  Administered 2022-08-14: 400 mg via INTRAVENOUS
  Filled 2022-08-14: qty 16

## 2022-08-14 MED ORDER — SODIUM CHLORIDE 0.9% FLUSH
10.0000 mL | INTRAVENOUS | Status: DC | PRN
  Administered 2022-08-14: 10 mL

## 2022-08-14 MED ORDER — SODIUM CHLORIDE 0.9% FLUSH
10.0000 mL | Freq: Once | INTRAVENOUS | Status: AC
  Administered 2022-08-14: 10 mL

## 2022-08-14 NOTE — Progress Notes (Signed)
[facility] OFFICE PROGRESS NOTE  Patient Care Team: Patient, No Pcp Per as PCP - General (General Practice) [name], MD as Consulting Physician (Hematology and Oncology) Eppie Gibson, MD as Attending Physician (Radiation Oncology) Jodi Marble, MD as Consulting Physician (Otolaryngology) Philomena Doheny, MD as Referring Physician (Plastic Surgery) Irene Shipper, MD as Consulting Physician (Gastroenterology) System, Provider Not In  ASSESSMENT & PLAN:  Malignant neoplasm of head and neck [facility]) The patient has no signs of recurrent disease in his neck His severe neck pain is related to prior treatment related fibrosis and neuropathic pain related to prior surgery He will continue treatment indefinitely with pembrolizumab I plan to repeat imaging study end of the year  Cancer associated pain His severe neck pain is related to complication from prior surgery and radiation, causing severe fibrosis and limitation of range of motion of his neck He will continue current prescribed pain medicines  Acquired hypothyroidism His TSH is intermittently elevated He does not need dose adjustment but we will monitor that closely  No orders of the defined types were placed in this encounter.   All questions were answered. The patient knows to call the clinic with any problems, questions or concerns. The total time spent in the appointment was 25 minutes encounter with patients including review of chart and various tests results, discussions about plan of care and coordination of care plan   [name], MD 08/14/2022 12:45 PM  INTERVAL HISTORY: Please see below for problem oriented charting. he returns for treatment follow-up on maintenance treatment with pembrolizumab Denies new side effects from treatment He continues to have chronic pain on the right side of his neck His current prescribed pain medicine is adequate to control his pain We discussed importance of regular  laxatives  REVIEW OF SYSTEMS:   Constitutional: Denies fevers, chills or abnormal weight loss Eyes: Denies blurriness of vision Ears, nose, mouth, throat, and face: Denies mucositis or sore throat Respiratory: Denies cough, dyspnea or wheezes Cardiovascular: Denies palpitation, chest discomfort or lower extremity swelling Gastrointestinal:  Denies nausea, heartburn or change in bowel habits Skin: Denies abnormal skin rashes Lymphatics: Denies new lymphadenopathy or easy bruising Neurological:Denies numbness, tingling or new weaknesses Behavioral/Psych: Mood is stable, no new changes  All other systems were reviewed with the patient and are negative.  I have reviewed the past medical history, past surgical history, social history and family history with the patient and they are unchanged from previous note.  ALLERGIES:  is allergic to phenergan [promethazine hcl], heparin, and clindamycin.  MEDICATIONS:  Current Outpatient Medications  Medication Sig Dispense Refill   blood glucose meter kit and supplies KIT Dispense based on patient and insurance preference. Use up to four times daily as directed. (FOR ICD-9 250.00, 250.01). 1 each 1   cetirizine (ZYRTEC) 10 MG tablet Take 1 tablet (10 mg total) by mouth daily as needed for allergies. 100 tablet 0   levothyroxine (SYNTHROID) 125 MCG tablet Take 1 tablet (125 mcg total) by mouth daily before breakfast. 60 tablet 1   metFORMIN (GLUCOPHAGE) 500 MG tablet Take 1 tablet (500 mg total) by mouth 2 (two) times daily with a meal. 60 tablet 2   methadone (DOLOPHINE) 10 MG tablet Take 1 tablet (10 mg total) by mouth every 12 (twelve) hours. 60 tablet 0   ondansetron (ZOFRAN) 8 MG tablet Take 1 tablet (8 mg total) by mouth every 8 (eight) hours as needed for nausea or vomiting. 90 tablet 1  oxyCODONE (OXY IR/ROXICODONE) 5 MG immediate release tablet Take 1 tablet (5 mg total) by mouth every 6 (six) hours as needed for severe pain. 90 tablet 0   No  current facility-administered medications for this visit.   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 Pan-endoscopy elsewhere showed cancer from nasopharyngeal space.   01/04/2007 - 02/20/2007 Chemotherapy   He received 2 cycles of cisplatin and 5FU followed by concurrent chemo with weekly cisplatin and radiation. He only received 2 doses of chemo due to severe mucositis, nausea and weight loss.   04/05/2007 - 08/04/2007 Chemotherapy   He received 4 more courses of cisplatin with 5FU and had complete response   07/05/2009 Procedure   Fine-needle aspirate of the right level II lymph nodes come from recurrent metastatic disease. Repeat endoscopy and CT scan show no evidence of disease elsewhere.   07/08/2009 - 12/02/2009 Chemotherapy   He was given 6 cycles of carboplatin, 5-FU and docetaxel   12/03/2009 Surgery   He has surgery to the residual lymph node on the right neck which showed no evidence of disease.   02/22/2012 Imaging   Repeat imaging study showed large recurrent mass. He was referred  elsewhere for further treatment.   05/03/2012 Surgery   He underwent left upper lobectomy.   04/29/2013 Imaging   PEt scan showed lesion on right level II B and lower lung was abnormal   06/03/2013 - 02/02/2014 Chemotherapy   He had 6 cycles of chemotherapy when he was found to have recurrence of cancer and had received oxaliplatin and capecitabine   06/07/2014 Imaging   PET CT scan showed persistent disease in the right neck lymph nodes and left lung   06/29/2014 Procedure   Accession: [id number] repeat LUL biopsy confirmed metastatic cancer   07/18/2014 - 07/31/2014 Radiation Therapy   He received palliative radiation therapy to the lungs   10/10/2014 Imaging   CT scan of the chest, abdomen and pelvis show regression in the size of the lung nodule in the left upper lobe and stable pulmonary nodules   01/24/2015 Imaging   CT scan showed stable disease in neck and lung   06/19/2015 Imaging   CT scan of the neck and the chest show possible mild progression of the nodule in the right side of the neck.   06/25/2015 Imaging   PET scan confirmed disease recurrence in the neck   07/07/2015 Imaging   He had MRI neck [facility]   09/03/2015 -  08/26/2018 Chemotherapy   He received palliative chemo with Nivolumab   10/29/2015 Imaging   PET CT showed positive response to Rx   02/28/2016 Imaging   Ct abdomen showed abnormal thinkening in his stomach   03/03/2016 Imaging   CT: Right sternocleidomastoid muscle metastasis appears less distinct but otherwise not significantly changed in size or configuration since 06/19/2015.2. Left level 3 lymph node which was hypermetabolic by PET-CT in January 2017 appears slightly smaller   04/01/2016 Imaging   CT cervical spine showed no acute fracture or traumatic malalignment in the cervical spine   04/22/2016 Procedure   Port-a-cath placed.   06/16/2016 Imaging   Ct neck showed right sternocleidomastoid muscle metastasis is further decreased in conspicuity  since May, and has mildly decreased in size since September 2016. Continued stability of sub-centimeter left cervical lymph nodes. No new or progressive metastatic disease in the neck.   06/16/2016 Imaging   CT chest showed stable masslike radiation fibrosis in the left upper lobe. Stable subcentimeter pulmonary nodules in the bilateral lower lobes. No new or progressive metastatic disease in the chest. Nonobstructing left renal stone.   10/13/2016 Imaging   Ct neck showed unchanged right sternocleidomastoid muscle metastasis. Unchanged subcentimeter left cervical lymph nodes. No evidence of new or progressive metastatic disease in the neck.   10/13/2016 Imaging   CT chest showed tiny hypervascular foci in the liver, not definitely seen on prior imaging of 06/16/2016 and 02/28/2016. Abdomen MRI without and with contrast recommended to further evaluate as metastatic disease is a concern. 2. Stable appearance of post treatment changes left upper lung and scattered tiny bilateral pulmonary nodules.   02/11/2017 Imaging   Ct neck: Lymph node mass right posterior neck appears improved from the prior study. Small posterior lymph nodes on the left unchanged. Occluded right jugular vein unchanged.   02/11/2017 Imaging   1. Similar appearance of postsurgical and radiation changes in the left upper lobe. 2. Similar bilateral pulmonary nodules. 3. No thoracic adenopathy. 4. Subtle foci of post-contrast enhancement within the liver are suboptimally characterized on this nondedicated study. Likely similar. These could either be re-evaluated at followup or more entirely characterized with abdominal MRI. 5. Left nephrolithiasis.   05/19/2017 Imaging   Matted lymph node mass right posterior neck appears larger in the recent CT. Accurate measurements difficult due to infiltrating tumor margins and infiltration of the muscle. Right jugular vein again appears occluded or resected. Small left posterior lymph nodes  stable. Left upper lobe airspace density stable and similar to the prior CT   06/03/2017 PET scan   1. Hypermetabolic ill-defined right level IIb lymph node, about 1.3 cm in diameter with maximum SUV 9.5 (formerly 8.1). Appearance suspicious for residual/recurrent malignancy. No worrisome left-sided lesion. 2. Left suprahilar indistinct opacity demonstrates no worrisome hypermetabolic activity. The 5 mm left lower lobe pulmonary nodule is stable and not currently hypermetabolic although below sensitive PET-CT size thresholds. 3. Other imaging findings of potential clinical significance: Bilateral nonobstructive nephrolithiasis. Chronic bilateral maxillary sinusitis.   05/11/2018 PET scan   1. Continued chronic accentuated metabolic activity in the vicinity of right level IIB and the adjacent right sternocleidomastoid muscle, with ill definition of surrounding tissue planes. Maximum SUV is currently 8.1, formerly 9.5. Accentuated metabolic activity is been present in this vicinity back through 06/25/2015, and there was also some low-level activity in this vicinity on 06/07/2014. Some of this may be from scarring and local muscular activity although clearly a component of residual tumor is difficult  to exclude given the focally high activity. 2. Other imaging findings of potential clinical significance: Chronic bilateral maxillary sinusitis. Chronic scarring in the left upper lobe. Chronically stable 5 mm left lower lobe nodule is considered benign. Nonobstructive left nephrolithiasis.   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 COMMENT:No significant keratinization is identified. Other basaloid carcinomas are in the differential diagnosis. No cell block material is available for further testing.   09/12/2018 Procedure   He underwent fine Needle Aspiration    10/04/2018 PET scan   1. Significant progression of local recurrence laterally in the mid right neck with an enlarging, increasingly hypermetabolic soft tissue mass. This involves the right sternocleidomastoid muscle. 2. Small lymph nodes in the right axilla are increasingly hypermetabolic. These are nonspecific and potentially reactive, although could reflect a small metastases. Small hypermetabolic nodule in the left suprasternal notch is unchanged. 3. No other evidence of metastatic disease.     10/07/2018 - 12/23/2018 Chemotherapy   The patient had cisplatin plus gemzar   12/07/2018 Imaging   1. Decreased size of lateral right neck mass. 2. Unchanged soft tissue nodule in the suprasternal notch. 3. No evidence of new metastatic disease in the neck.     05/30/2019 Imaging   CT neck No clear change or progression compared to the study of March. Overall measurements of the right lateral neck mass are similar, approximately 3 x 1.8 cm. See above discussion. One could argue that there is slight increase in lateral bulging, possibly with an increase in contrast enhancement, towards the inferior margin. This is of questionable validity but could possibly represent some progression or inflammatory change. Other findings in the region are stable.   07/20/2019 - 09/15/2019 Chemotherapy   The patient had dexamethasone (DECADRON) 4 MG tablet, 1 of 1 cycle, Start date: --, End date: -- palonosetron (ALOXI) injection 0.25 mg, 0.25 mg, Intravenous,  Once, 3 of 4 cycles Administration: 0.25 mg (07/20/2019), 0.25 mg (08/10/2019), 0.25 mg (09/08/2019) CISplatin (PLATINOL) 84 mg in sodium chloride 0.9 % 250 mL chemo infusion, 40 mg/m2 = 84 mg (80 % of original dose 50 mg/m2), Intravenous,  Once, 3 of 4 cycles Dose modification: 40 mg/m2 (80 % of original dose 50 mg/m2, Cycle 1, Reason: Dose Not Tolerated) Administration: 84 mg (07/20/2019), 84 mg (08/10/2019), 83 mg (09/08/2019) gemcitabine (GEMZAR) 1,600 mg in  sodium chloride 0.9 % 250 mL chemo infusion, 1,672 mg (80 % of original dose 1,000 mg/m2), Intravenous,  Once, 3 of 4 cycles Dose modification: 800 mg/m2 (80 % of original dose 1,000 mg/m2, Cycle 1, Reason: Provider Judgment) Administration: 1,600 mg (07/20/2019), 1,600 mg (07/27/2019), 1,600 mg (08/10/2019), 1,600 mg (08/17/2019), 1,600 mg (09/08/2019), 1,672 mg (09/15/2019) ondansetron (ZOFRAN) 8 mg, dexamethasone (DECADRON) 10 mg in sodium chloride 0.9 % 50 mL IVPB, , Intravenous,  Once, 3 of 4 cycles Administration:  (09/15/2019) fosaprepitant (EMEND) 150 mg, dexamethasone (DECADRON) 12 mg in sodium chloride 0.9 % 145 mL IVPB, , Intravenous,  Once, 3 of 4 cycles Administration:  (07/20/2019),  (08/10/2019),  (09/08/2019)  for chemotherapy treatment.    10/02/2019 Imaging   CT neck As compared to 05/30/2019, no significant interval change in size of an ill-defined mass within the right lateral neck, again measuring 3.3 x 1.8 cm in transaxial dimensions.   Unchanged mildly enlarged left level I lymph node measuring 1.1 cm in short axis.  Unchanged node or nodule at the thoracic inlet, measuring 1.3 x 0.8 cm.   Please refer to concurrently performed chest CT for a description of findings below the level of the thoracic inlet.     10/02/2019 Imaging   CT chest 1. No new or progressive findings in the chest to suggest metastatic disease. 2. Bilateral subcentimeter solid pulmonary nodules are stable since 2018. 3. Hyperdense 1.1 cm anterior liver focus, not clearly visualized on prior studies. Suggest MRI abdomen without and with IV contrast for further characterization.   10/20/2019 - 12/29/2019 Chemotherapy   The patient had ondansetron (ZOFRAN) injection 8 mg, 8 mg (100 % of original dose 8 mg), Intravenous,  Once, 2 of 5 cycles Dose modification: 8 mg (original dose 8 mg, Cycle 2) Administration: 8 mg (11/17/2019), 8 mg (12/15/2019), 8 mg (12/29/2019) gemcitabine (GEMZAR) 2,000 mg in sodium  chloride 0.9 % 250 mL chemo infusion, 2,090 mg, Intravenous,  Once, 3 of 6 cycles Administration: 2,000 mg (10/20/2019), 2,000 mg (11/03/2019), 2,000 mg (11/17/2019), 2,000 mg (12/15/2019), 2,000 mg (12/29/2019)  for chemotherapy treatment.    06/12/2020 Imaging   1. Enlarging superficial, exophytic component of the chronic right sternocleidomastoid muscle mass. See series 6, image 55. 2. Elsewhere stable CT appearance of the Neck.   06/12/2020 Imaging   Post treatment scarring in the left hemithorax, stable. No evidence recurrent or metastatic disease   06/14/2020 - 10/15/2020 Chemotherapy   He received carboplatin, 5FU and Beryle Flock       10/31/2020 Procedure   Interval improvement in right lateral lymph node mass. Improvement in dermal component as well as invasion of the right sternocleidomastoid muscle.   10 mm submental lymph node slightly enlarged compared to the prior study. Continued follow-up recommended.   11/01/2020 - 05/22/2022 Chemotherapy   Patient is on Treatment Plan : HEAD/NECK Pembrolizumab Q21D     07/03/2022 -  Chemotherapy   Patient is on Treatment Plan : Head and neck Pembrolizumab (400) q42d       PHYSICAL EXAMINATION: ECOG PERFORMANCE STATUS: 1 - Symptomatic but completely ambulatory  Vitals:   08/14/22 1015  BP: 128/79  Pulse: 91  Resp: 18  SpO2: 100%   Filed Weights   08/14/22 1015  Weight: 184 lb 3.2 oz (83.6 kg)    GENERAL:alert, no distress and comfortable  NEURO: alert & oriented x 3 with fluent speech, no focal motor/sensory deficits  LABORATORY DATA:  I have reviewed the data as listed    Component Value Date/Time   NA 139 08/14/2022 0950   NA 139 09/22/2017 0829   K 3.7 08/14/2022 0950   K 3.5 09/22/2017 0829   CL 103 08/14/2022 0950   CO2 27 08/14/2022 0950   CO2 26 09/22/2017 0829   GLUCOSE 175 (H) 08/14/2022 0950   GLUCOSE 133 09/22/2017 0829   BUN 17 08/14/2022 0950   BUN 14.1 09/22/2017 0829   CREATININE 0.99 08/14/2022 0950    CREATININE 0.9 09/22/2017 0829   CALCIUM 9.4 08/14/2022 0950   CALCIUM 9.1 09/22/2017 0829   PROT 7.6 08/14/2022 0950   PROT 6.8 09/22/2017 0829   ALBUMIN 4.4 08/14/2022 0950   ALBUMIN 4.1 09/22/2017 0829   AST 20 08/14/2022 0950   AST 22 09/22/2017 0829   ALT 16 08/14/2022 0950   ALT 30 09/22/2017 0829   ALKPHOS 67 08/14/2022 0950   ALKPHOS 55 09/22/2017 0829   BILITOT 0.3 08/14/2022 0950   BILITOT 0.35 09/22/2017 0829   GFRNONAA >60 08/14/2022 0950  GFRAA >60 07/05/2020 0845   GFRAA >60 02/06/2019 1215    No results found for: "SPEP", "UPEP"  Lab Results  Component Value Date   WBC 6.2 08/14/2022   NEUTROABS 3.8 08/14/2022   HGB 13.0 08/14/2022   HCT 39.4 08/14/2022   MCV 87.9 08/14/2022   PLT 269 08/14/2022      Chemistry      Component Value Date/Time   NA 139 08/14/2022 0950   NA 139 09/22/2017 0829   K 3.7 08/14/2022 0950   K 3.5 09/22/2017 0829   CL 103 08/14/2022 0950   CO2 27 08/14/2022 0950   CO2 26 09/22/2017 0829   BUN 17 08/14/2022 0950   BUN 14.1 09/22/2017 0829   CREATININE 0.99 08/14/2022 0950   CREATININE 0.9 09/22/2017 0829      Component Value Date/Time   CALCIUM 9.4 08/14/2022 0950   CALCIUM 9.1 09/22/2017 0829   ALKPHOS 67 08/14/2022 0950   ALKPHOS 55 09/22/2017 0829   AST 20 08/14/2022 0950   AST 22 09/22/2017 0829   ALT 16 08/14/2022 0950   ALT 30 09/22/2017 0829   BILITOT 0.3 08/14/2022 0950   BILITOT 0.35 09/22/2017 0829

## 2022-08-14 NOTE — Assessment & Plan Note (Signed)
The patient has no signs of recurrent disease in his neck His severe neck pain is related to prior treatment related fibrosis and neuropathic pain related to prior surgery He will continue treatment indefinitely with pembrolizumab I plan to repeat imaging study end of the year

## 2022-08-14 NOTE — Assessment & Plan Note (Signed)
His TSH is intermittently elevated He does not need dose adjustment but we will monitor that closely

## 2022-08-14 NOTE — Patient Instructions (Signed)
[facility] ONCOLOGY  Discharge Instructions: Thank you for choosing [facility] to provide your oncology and hematology care.   If you have a lab appointment with the [facility], please go directly to the [facility] and check in at the registration area.   Wear comfortable clothing and clothing appropriate for easy access to any Portacath or PICC line.   We strive to give you quality time with your provider. You may need to reschedule your appointment if you arrive late (15 or more minutes).  Arriving late affects you and other patients whose appointments are after yours.  Also, if you miss three or more appointments without notifying the office, you may be dismissed from the clinic at the provider's discretion.      For prescription refill requests, have your pharmacy contact our office and allow 72 hours for refills to be completed.    Today you received the following chemotherapy and/or immunotherapy agents: Keytruda.       To help prevent nausea and vomiting after your treatment, we encourage you to take your nausea medication as directed.  BELOW ARE SYMPTOMS THAT SHOULD BE REPORTED IMMEDIATELY: *FEVER GREATER THAN 100.4 F (38 C) OR HIGHER *CHILLS OR SWEATING *NAUSEA AND VOMITING THAT IS NOT CONTROLLED WITH YOUR NAUSEA MEDICATION *UNUSUAL SHORTNESS OF BREATH *UNUSUAL BRUISING OR BLEEDING *URINARY PROBLEMS (pain or burning when urinating, or frequent urination) *BOWEL PROBLEMS (unusual diarrhea, constipation, pain near the anus) TENDERNESS IN MOUTH AND THROAT WITH OR WITHOUT PRESENCE OF ULCERS (sore throat, sores in mouth, or a toothache) UNUSUAL RASH, SWELLING OR PAIN  UNUSUAL VAGINAL DISCHARGE OR ITCHING   Items with * indicate a potential emergency and should be followed up as soon as possible or go to the Emergency Department if any problems should occur.  Please show the CHEMOTHERAPY ALERT CARD or IMMUNOTHERAPY ALERT CARD at check-in to  the Emergency Department and triage nurse.  Should you have questions after your visit or need to cancel or reschedule your appointment, please contact [facility]  Dept: [phone]  and follow the prompts.  Office hours are 8:00 a.m. to 4:30 p.m. Monday - Friday. Please note that voicemails left after 4:00 p.m. may not be returned until the following business day.  We are closed weekends and major holidays. You have access to a nurse at all times for urgent questions. Please call the main number to the clinic Dept: [phone] and follow the prompts.   For any non-urgent questions, you may also contact your provider using MyChart. We now offer e-Visits for anyone 26 and older to request care online for non-urgent symptoms. For details visit [email or web address].   Also download the MyChart app! Go to the app store, search "MyChart", open the app, select [facility], and log in with your MyChart username and password.  Masks are optional in the cancer centers. If you would like for your care team to wear a mask while they are taking care of you, please let them know. You may have one support person who is at least 36 years old accompany you for your appointments.

## 2022-08-14 NOTE — Assessment & Plan Note (Signed)
His severe neck pain is related to complication from prior surgery and radiation, causing severe fibrosis and limitation of range of motion of his neck He will continue current prescribed pain medicines

## 2022-08-15 LAB — T4: T4, Total: 6.2 ug/dL (ref 4.5–12.0)

## 2022-08-16 ENCOUNTER — Other Ambulatory Visit: Payer: Self-pay

## 2022-09-02 ENCOUNTER — Other Ambulatory Visit (HOSPITAL_BASED_OUTPATIENT_CLINIC_OR_DEPARTMENT_OTHER): Payer: Self-pay

## 2022-09-02 ENCOUNTER — Other Ambulatory Visit: Payer: Self-pay

## 2022-09-02 MED ORDER — LEVOTHYROXINE SODIUM 125 MCG PO TABS
125.0000 ug | ORAL_TABLET | Freq: Every day | ORAL | 1 refills | Status: DC
  Filled 2022-09-02 – 2022-10-13 (×2): qty 60, 60d supply, fill #0

## 2022-09-02 MED ORDER — METFORMIN HCL 500 MG PO TABS
500.0000 mg | ORAL_TABLET | Freq: Two times a day (BID) | ORAL | 2 refills | Status: DC
  Filled 2022-09-02: qty 60, 30d supply, fill #0
  Filled 2022-11-09: qty 60, 30d supply, fill #1
  Filled 2023-01-07: qty 60, 30d supply, fill #2

## 2022-09-03 ENCOUNTER — Other Ambulatory Visit (HOSPITAL_BASED_OUTPATIENT_CLINIC_OR_DEPARTMENT_OTHER): Payer: Self-pay

## 2022-09-03 ENCOUNTER — Other Ambulatory Visit: Payer: Self-pay | Admitting: Hematology and Oncology

## 2022-09-03 ENCOUNTER — Other Ambulatory Visit: Payer: Self-pay

## 2022-09-03 MED ORDER — METHADONE HCL 10 MG PO TABS
10.0000 mg | ORAL_TABLET | Freq: Two times a day (BID) | ORAL | 0 refills | Status: DC
  Filled 2022-09-03: qty 60, 30d supply, fill #0

## 2022-09-17 ENCOUNTER — Other Ambulatory Visit: Payer: Self-pay | Admitting: Hematology and Oncology

## 2022-09-17 ENCOUNTER — Other Ambulatory Visit (HOSPITAL_BASED_OUTPATIENT_CLINIC_OR_DEPARTMENT_OTHER): Payer: Self-pay

## 2022-09-17 MED ORDER — OXYCODONE HCL 5 MG PO TABS
5.0000 mg | ORAL_TABLET | Freq: Four times a day (QID) | ORAL | 0 refills | Status: DC | PRN
  Filled 2022-09-17: qty 90, 23d supply, fill #0

## 2022-09-23 ENCOUNTER — Other Ambulatory Visit (HOSPITAL_BASED_OUTPATIENT_CLINIC_OR_DEPARTMENT_OTHER): Payer: Self-pay

## 2022-09-23 ENCOUNTER — Other Ambulatory Visit (HOSPITAL_COMMUNITY): Payer: Self-pay

## 2022-09-25 ENCOUNTER — Inpatient Hospital Stay: Payer: Medicaid Other

## 2022-09-25 ENCOUNTER — Inpatient Hospital Stay: Payer: Medicaid Other | Attending: Hematology and Oncology | Admitting: Hematology and Oncology

## 2022-09-25 ENCOUNTER — Encounter: Payer: Self-pay | Admitting: Hematology and Oncology

## 2022-09-25 ENCOUNTER — Other Ambulatory Visit: Payer: Self-pay

## 2022-09-25 ENCOUNTER — Other Ambulatory Visit: Payer: Self-pay | Admitting: Hematology and Oncology

## 2022-09-25 VITALS — HR 86 | Resp 18

## 2022-09-25 VITALS — BP 160/90 | HR 18 | Resp 86 | Ht 69.0 in | Wt 186.4 lb

## 2022-09-25 DIAGNOSIS — C119 Malignant neoplasm of nasopharynx, unspecified: Secondary | ICD-10-CM | POA: Diagnosis present

## 2022-09-25 DIAGNOSIS — C760 Malignant neoplasm of head, face and neck: Secondary | ICD-10-CM | POA: Diagnosis not present

## 2022-09-25 DIAGNOSIS — Z95828 Presence of other vascular implants and grafts: Secondary | ICD-10-CM

## 2022-09-25 DIAGNOSIS — Z7189 Other specified counseling: Secondary | ICD-10-CM

## 2022-09-25 DIAGNOSIS — G893 Neoplasm related pain (acute) (chronic): Secondary | ICD-10-CM | POA: Diagnosis not present

## 2022-09-25 DIAGNOSIS — Z452 Encounter for adjustment and management of vascular access device: Secondary | ICD-10-CM | POA: Insufficient documentation

## 2022-09-25 DIAGNOSIS — E119 Type 2 diabetes mellitus without complications: Secondary | ICD-10-CM | POA: Diagnosis not present

## 2022-09-25 DIAGNOSIS — Z79620 Long term (current) use of immunosuppressive biologic: Secondary | ICD-10-CM | POA: Diagnosis not present

## 2022-09-25 DIAGNOSIS — C7800 Secondary malignant neoplasm of unspecified lung: Secondary | ICD-10-CM

## 2022-09-25 LAB — CMP (CANCER CENTER ONLY)
ALT: 20 U/L (ref 0–44)
AST: 20 U/L (ref 15–41)
Albumin: 4.5 g/dL (ref 3.5–5.0)
Alkaline Phosphatase: 73 U/L (ref 38–126)
Anion gap: 5 (ref 5–15)
BUN: 19 mg/dL (ref 6–20)
CO2: 32 mmol/L (ref 22–32)
Calcium: 9.8 mg/dL (ref 8.9–10.3)
Chloride: 105 mmol/L (ref 98–111)
Creatinine: 1.01 mg/dL (ref 0.61–1.24)
GFR, Estimated: >60 mL/min (ref >60)
Glucose, Bld: 112 mg/dL — ABNORMAL HIGH (ref 70–99)
Potassium: 4.1 mmol/L (ref 3.5–5.1)
Sodium: 142 mmol/L (ref 135–145)
Total Bilirubin: 0.2 mg/dL — ABNORMAL LOW (ref 0.3–1.2)
Total Protein: 7.7 g/dL (ref 6.5–8.1)

## 2022-09-25 LAB — CBC WITH DIFFERENTIAL (CANCER CENTER ONLY)
Abs Immature Granulocytes: 0.02 10*3/uL (ref 0.00–0.07)
Basophils Absolute: 0 10*3/uL (ref 0.0–0.1)
Basophils Relative: 1 %
Eosinophils Absolute: 0.2 10*3/uL (ref 0.0–0.5)
Eosinophils Relative: 3 %
HCT: 38.5 % — ABNORMAL LOW (ref 39.0–52.0)
Hemoglobin: 12.7 g/dL — ABNORMAL LOW (ref 13.0–17.0)
Immature Granulocytes: 0 %
Lymphocytes Relative: 32 %
Lymphs Abs: 2.1 10*3/uL (ref 0.7–4.0)
MCH: 28.9 pg (ref 26.0–34.0)
MCHC: 33 g/dL (ref 30.0–36.0)
MCV: 87.7 fL (ref 80.0–100.0)
Monocytes Absolute: 0.6 10*3/uL (ref 0.1–1.0)
Monocytes Relative: 8 %
Neutro Abs: 3.8 10*3/uL (ref 1.7–7.7)
Neutrophils Relative %: 56 %
Platelet Count: 259 10*3/uL (ref 150–400)
RBC: 4.39 MIL/uL (ref 4.22–5.81)
RDW: 13.7 % (ref 11.5–15.5)
WBC Count: 6.6 10*3/uL (ref 4.0–10.5)
nRBC: 0 % (ref 0.0–0.2)

## 2022-09-25 LAB — TSH: TSH: 1.658 u[IU]/mL (ref 0.350–4.500)

## 2022-09-25 MED ORDER — SODIUM CHLORIDE 0.9 % IV SOLN: 400.0000 mg | Freq: Once | INTRAVENOUS | Status: DC

## 2022-09-25 MED ORDER — ALTEPLASE 2 MG IJ SOLR
2.0000 mg | Freq: Once | INTRAMUSCULAR | Status: AC
  Administered 2022-09-25: 2 mg
  Filled 2022-09-25: qty 2

## 2022-09-25 MED ORDER — SODIUM CHLORIDE 0.9 % IV SOLN
200.0000 mg | Freq: Once | INTRAVENOUS | Status: DC
  Filled 2022-09-25: qty 8

## 2022-09-25 MED ORDER — SODIUM CHLORIDE 0.9 % IV SOLN: Freq: Once | INTRAVENOUS | Status: DC

## 2022-09-25 MED ORDER — ONDANSETRON HCL 4 MG/2ML IJ SOLN: 8.0000 mg | Freq: Once | INTRAMUSCULAR | Status: DC

## 2022-09-25 MED ORDER — SODIUM CHLORIDE 0.9% FLUSH
10.0000 mL | Freq: Once | INTRAVENOUS | Status: AC
  Administered 2022-09-25: 10 mL

## 2022-09-25 NOTE — Progress Notes (Signed)
Patient port would not yield blood return after 58min wait post cathflo.  Dr Alvy Bimler made aware and pt will be rescheduled for treatment following IR eval.  Pt made aware and verbalized understanding.  Port deaccessed without flushing due to cathflo

## 2022-09-25 NOTE — Assessment & Plan Note (Signed)
His severe neck pain is related to complication from prior surgery and radiation, causing severe fibrosis and limitation of range of motion of his neck He will continue current prescribed pain medicines

## 2022-09-25 NOTE — Assessment & Plan Note (Signed)
The patient has no signs of recurrent disease in his neck His severe neck pain is related to prior treatment related fibrosis and neuropathic pain related to prior surgery He will continue treatment indefinitely with pembrolizumab I plan to repeat imaging study next year

## 2022-09-25 NOTE — Progress Notes (Signed)
Doses of Pembrolizumab 200 mg x 2 bags given today for total dose = 400 mg. This will help avoid wasting 2 doses of Pembrolizumab that we have in pharmacy stock that expire 09/28/22.  RN and pt aware.  Kennith Center, Pharm.D., CPP 09/25/2022@11 :33 AM

## 2022-09-25 NOTE — Progress Notes (Signed)
[place] OFFICE PROGRESS NOTE  Patient Care Team: Patient, No Pcp Per as PCP - General (General Practice) [name], MD as Consulting Physician (Hematology and Oncology) Eppie Gibson, MD as Attending Physician (Radiation Oncology) Jodi Marble, MD as Consulting Physician (Otolaryngology) Philomena Doheny, MD as Referring Physician (Plastic Surgery) Irene Shipper, MD as Consulting Physician (Gastroenterology) System, Provider Not In  ASSESSMENT & PLAN:  Malignant neoplasm of head and neck [facility]) The patient has no signs of recurrent disease in his neck His severe neck pain is related to prior treatment related fibrosis and neuropathic pain related to prior surgery He will continue treatment indefinitely with pembrolizumab I plan to repeat imaging study next year  Cancer associated pain His severe neck pain is related to complication from prior surgery and radiation, causing severe fibrosis and limitation of range of motion of his neck He will continue current prescribed pain medicines  Type 2 diabetes mellitus ([place]) He will continue his best effort to monitor his carbohydrate intake We will continue his current medication  No orders of the defined types were placed in this encounter.   All questions were answered. The patient knows to call the clinic with any problems, questions or concerns. The total time spent in the appointment was 25 minutes encounter with patients including review of chart and various tests results, discussions about plan of care and coordination of care plan   [name], MD 09/25/2022 11:35 AM  INTERVAL HISTORY: Please see below for problem oriented charting. he returns for treatment follow-up with his colleagues He is doing well except for continuous persistent intermittent pain, slightly worse in the wintertime No other new symptoms  REVIEW OF SYSTEMS:   Constitutional: Denies fevers, chills or abnormal weight loss Eyes: Denies  blurriness of vision Ears, nose, mouth, throat, and face: Denies mucositis or sore throat Respiratory: Denies cough, dyspnea or wheezes Cardiovascular: Denies palpitation, chest discomfort or lower extremity swelling Gastrointestinal:  Denies nausea, heartburn or change in bowel habits Skin: Denies abnormal skin rashes Lymphatics: Denies new lymphadenopathy or easy bruising Neurological:Denies numbness, tingling or new weaknesses Behavioral/Psych: Mood is stable, no new changes  All other systems were reviewed with the patient and are negative.  I have reviewed the past medical history, past surgical history, social history and family history with the patient and they are unchanged from previous note.  ALLERGIES:  is allergic to phenergan [promethazine hcl], heparin, and clindamycin.  MEDICATIONS:  Current Outpatient Medications  Medication Sig Dispense Refill   blood glucose meter kit and supplies KIT Dispense based on patient and insurance preference. Use up to four times daily as directed. (FOR ICD-9 250.00, 250.01). 1 each 1   cetirizine (ZYRTEC) 10 MG tablet Take 1 tablet (10 mg total) by mouth daily as needed for allergies. 100 tablet 0   levothyroxine (SYNTHROID) 125 MCG tablet Take 1 tablet (125 mcg total) by mouth daily before breakfast. 60 tablet 1   metFORMIN (GLUCOPHAGE) 500 MG tablet Take 1 tablet (500 mg total) by mouth 2 (two) times daily with a meal. 60 tablet 2   methadone (DOLOPHINE) 10 MG tablet Take 1 tablet (10 mg total) by mouth every 12 (twelve) hours. 60 tablet 0   ondansetron (ZOFRAN) 8 MG tablet Take 1 tablet (8 mg total) by mouth every 8 (eight) hours as needed for nausea or vomiting. 90 tablet 1   oxyCODONE (OXY IR/ROXICODONE) 5 MG immediate release tablet Take 1 tablet (5 mg total) by mouth every 6 (  six) hours as needed for severe pain. 90 tablet 0   No current facility-administered medications for this visit.   Facility-Administered Medications Ordered in  Other Visits  Medication Dose Route Frequency Provider Last Rate Last Admin   0.9 %  sodium chloride infusion   Intravenous Once Alvy Bimler, Hiedi Touchton, MD       anticoagulant sodium citrate solution 5 mL  5 mL Intracatheter Once Alvy Bimler, Shantel Helwig, MD       anticoagulant sodium citrate solution 5 mL  5 mL Intracatheter Once Alvy Bimler, Aizlynn Digilio, MD       ondansetron (ZOFRAN) injection 8 mg  8 mg Intravenous Once Alvy Bimler, Andy Moye, MD       pembrolizumab (KEYTRUDA) 200 mg in sodium chloride 0.9 % 50 mL chemo infusion  200 mg Intravenous Once Sylar Voong, MD       pembrolizumab (KEYTRUDA) 200 mg in sodium chloride 0.9 % 50 mL chemo infusion  200 mg Intravenous Once [name], MD        SUMMARY OF ONCOLOGIC HISTORY: Oncology History Overview Note  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 Pan-endoscopy elsewhere showed cancer from nasopharyngeal space.   01/04/2007 - 02/20/2007 Chemotherapy   He received 2 cycles of cisplatin and 5FU followed by concurrent chemo with weekly cisplatin and radiation. He only received 2 doses of chemo due to severe mucositis, nausea and weight loss.   04/05/2007 - 08/04/2007 Chemotherapy   He received 4 more courses of cisplatin with 5FU and had complete response   07/05/2009 Procedure   Fine-needle aspirate of the right level II lymph nodes come from recurrent metastatic disease. Repeat endoscopy and CT scan show no evidence of disease elsewhere.   07/08/2009 - 12/02/2009 Chemotherapy   He was given 6 cycles of carboplatin, 5-FU and docetaxel   12/03/2009 Surgery   He  has surgery to the residual lymph node on the right neck which showed no evidence of disease.   02/22/2012 Imaging   Repeat imaging study showed large recurrent mass. He was referred elsewhere for further treatment.   05/03/2012 Surgery   He underwent left upper lobectomy.   04/29/2013 Imaging   PEt scan showed lesion on right level II B and lower lung was abnormal   06/03/2013 - 02/02/2014 Chemotherapy   He had 6 cycles of chemotherapy when he was found to have recurrence of cancer and had received oxaliplatin and capecitabine   06/07/2014 Imaging   PET CT scan showed persistent disease in the right neck lymph nodes and left lung   06/29/2014 Procedure   Accession: [id number] repeat LUL biopsy confirmed metastatic cancer   07/18/2014 - 07/31/2014 Radiation Therapy   He received palliative radiation therapy to the lungs   10/10/2014 Imaging   CT scan of the chest, abdomen and pelvis show regression in the size of the lung nodule in the left upper lobe and stable pulmonary nodules   01/24/2015 Imaging   CT scan showed stable disease in neck and lung   06/19/2015 Imaging   CT scan of  the neck and the chest show possible mild progression of the nodule in the right side of the neck.   06/25/2015 Imaging   PET scan confirmed disease recurrence in the neck   07/07/2015 Imaging   He had [facility] neck at [facility]   09/03/2015 - 08/26/2018 Chemotherapy   He received palliative chemo with Nivolumab   10/29/2015 Imaging   PET CT showed positive response to Rx   02/28/2016 Imaging   Ct abdomen showed abnormal thinkening in his stomach   03/03/2016 Imaging   CT: Right sternocleidomastoid muscle metastasis appears less distinct but otherwise not significantly changed in size or configuration since 06/19/2015.2. Left level 3 lymph node which was hypermetabolic by PET-CT in January 2017 appears slightly smaller   04/01/2016 Imaging   CT cervical spine showed no acute fracture or traumatic malalignment in the  cervical spine   04/22/2016 Procedure   Port-a-cath placed.   06/16/2016 Imaging   Ct neck showed right sternocleidomastoid muscle metastasis is further decreased in conspicuity since May, and has mildly decreased in size since September 2016. Continued stability of sub-centimeter left cervical lymph nodes. No new or progressive metastatic disease in the neck.   06/16/2016 Imaging   CT chest showed stable masslike radiation fibrosis in the left upper lobe. Stable subcentimeter pulmonary nodules in the bilateral lower lobes. No new or progressive metastatic disease in the chest. Nonobstructing left renal stone.   10/13/2016 Imaging   Ct neck showed unchanged right sternocleidomastoid muscle metastasis. Unchanged subcentimeter left cervical lymph nodes. No evidence of new or progressive metastatic disease in the neck.   10/13/2016 Imaging   CT chest showed tiny hypervascular foci in the liver, not definitely seen on prior imaging of 06/16/2016 and 02/28/2016. Abdomen MRI without and with contrast recommended to further evaluate as metastatic disease is a concern. 2. Stable appearance of post treatment changes left upper lung and scattered tiny bilateral pulmonary nodules.   02/11/2017 Imaging   Ct neck: Lymph node mass right posterior neck appears improved from the prior study. Small posterior lymph nodes on the left unchanged. Occluded right jugular vein unchanged.   02/11/2017 Imaging   1. Similar appearance of postsurgical and radiation changes in the left upper lobe. 2. Similar bilateral pulmonary nodules. 3. No thoracic adenopathy. 4. Subtle foci of post-contrast enhancement within the liver are suboptimally characterized on this nondedicated study. Likely similar. These could either be re-evaluated at followup or more entirely characterized with abdominal MRI. 5. Left nephrolithiasis.   05/19/2017 Imaging   Matted lymph node mass right posterior neck appears larger in the recent CT. Accurate  measurements difficult due to infiltrating tumor margins and infiltration of the muscle. Right jugular vein again appears occluded or resected. Small left posterior lymph nodes stable. Left upper lobe airspace density stable and similar to the prior CT   06/03/2017 PET scan   1. Hypermetabolic ill-defined right level IIb lymph node, about 1.3 cm in diameter with maximum SUV 9.5 (formerly 8.1). Appearance suspicious for residual/recurrent malignancy. No worrisome left-sided lesion. 2. Left suprahilar indistinct opacity demonstrates no worrisome hypermetabolic activity. The 5 mm left lower lobe pulmonary nodule is stable and not currently hypermetabolic although below sensitive PET-CT size thresholds. 3. Other imaging findings of potential clinical significance: Bilateral nonobstructive nephrolithiasis. Chronic bilateral maxillary sinusitis.   05/11/2018 PET scan   1. Continued chronic accentuated metabolic activity in the vicinity of right level IIB and the adjacent right sternocleidomastoid muscle, with ill definition of surrounding tissue planes. Maximum SUV  is currently 8.1, formerly 9.5. Accentuated metabolic activity is been present in this vicinity back through 06/25/2015, and there was also some low-level activity in this vicinity on 06/07/2014. Some of this may be from scarring and local muscular activity although clearly a component of residual tumor is difficult to exclude given the focally high activity. 2. Other imaging findings of potential clinical significance: Chronic bilateral maxillary sinusitis. Chronic scarring in the left upper lobe. Chronically stable 5 mm left lower lobe nodule is considered benign. Nonobstructive left nephrolithiasis.   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 COMMENT:No significant keratinization is identified. Other  basaloid carcinomas are in the differential diagnosis. No cell block material is available for further testing.   09/12/2018 Procedure   He underwent fine Needle Aspiration   10/04/2018 PET scan   1. Significant progression of local recurrence laterally in the mid right neck with an enlarging, increasingly hypermetabolic soft tissue mass. This involves the right sternocleidomastoid muscle. 2. Small lymph nodes in the right axilla are increasingly hypermetabolic. These are nonspecific and potentially reactive, although could reflect a small metastases. Small hypermetabolic nodule in the left suprasternal notch is unchanged. 3. No other evidence of metastatic disease.     10/07/2018 - 12/23/2018 Chemotherapy   The patient had cisplatin plus gemzar   12/07/2018 Imaging   1. Decreased size of lateral right neck mass. 2. Unchanged soft tissue nodule in the suprasternal notch. 3. No evidence of new metastatic disease in the neck.     05/30/2019 Imaging   CT neck No clear change or progression compared to the study of March. Overall measurements of the right lateral neck mass are similar, approximately 3 x 1.8 cm. See above discussion. One could argue that there is slight increase in lateral bulging, possibly with an increase in contrast enhancement, towards the inferior margin. This is of questionable validity but could possibly represent some progression or inflammatory change. Other findings in the region are stable.   07/20/2019 - 09/15/2019 Chemotherapy   The patient had dexamethasone (DECADRON) 4 MG tablet, 1 of 1 cycle, Start date: --, End date: -- palonosetron (ALOXI) injection 0.25 mg, 0.25 mg, Intravenous,  Once, 3 of 4 cycles Administration: 0.25 mg (07/20/2019), 0.25 mg (08/10/2019), 0.25 mg (09/08/2019) CISplatin (PLATINOL) 84 mg in sodium chloride 0.9 % 250 mL chemo infusion, 40 mg/m2 = 84 mg (80 % of original dose 50 mg/m2), Intravenous,  Once, 3 of 4 cycles Dose modification: 40 mg/m2  (80 % of original dose 50 mg/m2, Cycle 1, Reason: Dose Not Tolerated) Administration: 84 mg (07/20/2019), 84 mg (08/10/2019), 83 mg (09/08/2019) gemcitabine (GEMZAR) 1,600 mg in sodium chloride 0.9 % 250 mL chemo infusion, 1,672 mg (80 % of original dose 1,000 mg/m2), Intravenous,  Once, 3 of 4 cycles Dose modification: 800 mg/m2 (80 % of original dose 1,000 mg/m2, Cycle 1, Reason: Provider Judgment) Administration: 1,600 mg (07/20/2019), 1,600 mg (07/27/2019), 1,600 mg (08/10/2019), 1,600 mg (08/17/2019), 1,600 mg (09/08/2019), 1,672 mg (09/15/2019) ondansetron (ZOFRAN) 8 mg, dexamethasone (DECADRON) 10 mg in sodium chloride 0.9 % 50 mL IVPB, , Intravenous,  Once, 3 of 4 cycles Administration:  (09/15/2019) fosaprepitant (EMEND) 150 mg, dexamethasone (DECADRON) 12 mg in sodium chloride 0.9 % 145 mL IVPB, , Intravenous,  Once, 3 of 4 cycles Administration:  (07/20/2019),  (08/10/2019),  (09/08/2019)  for chemotherapy treatment.    10/02/2019  Imaging   CT neck As compared to 05/30/2019, no significant interval change in size of an ill-defined mass within the right lateral neck, again measuring 3.3 x 1.8 cm in transaxial dimensions.   Unchanged mildly enlarged left level I lymph node measuring 1.1 cm in short axis.   Unchanged node or nodule at the thoracic inlet, measuring 1.3 x 0.8 cm.   Please refer to concurrently performed chest CT for a description of findings below the level of the thoracic inlet.     10/02/2019 Imaging   CT chest 1. No new or progressive findings in the chest to suggest metastatic disease. 2. Bilateral subcentimeter solid pulmonary nodules are stable since 2018. 3. Hyperdense 1.1 cm anterior liver focus, not clearly visualized on prior studies. Suggest MRI abdomen without and with IV contrast for further characterization.   10/20/2019 - 12/29/2019 Chemotherapy   The patient had ondansetron (ZOFRAN) injection 8 mg, 8 mg (100 % of original dose 8 mg), Intravenous,  Once, 2  of 5 cycles Dose modification: 8 mg (original dose 8 mg, Cycle 2) Administration: 8 mg (11/17/2019), 8 mg (12/15/2019), 8 mg (12/29/2019) gemcitabine (GEMZAR) 2,000 mg in sodium chloride 0.9 % 250 mL chemo infusion, 2,090 mg, Intravenous,  Once, 3 of 6 cycles Administration: 2,000 mg (10/20/2019), 2,000 mg (11/03/2019), 2,000 mg (11/17/2019), 2,000 mg (12/15/2019), 2,000 mg (12/29/2019)  for chemotherapy treatment.    06/12/2020 Imaging   1. Enlarging superficial, exophytic component of the chronic right sternocleidomastoid muscle mass. See series 6, image 55. 2. Elsewhere stable CT appearance of the Neck.   06/12/2020 Imaging   Post treatment scarring in the left hemithorax, stable. No evidence recurrent or metastatic disease   06/14/2020 - 10/15/2020 Chemotherapy   He received carboplatin, 5FU and Beryle Flock       10/31/2020 Procedure   Interval improvement in right lateral lymph node mass. Improvement in dermal component as well as invasion of the right sternocleidomastoid muscle.   10 mm submental lymph node slightly enlarged compared to the prior study. Continued follow-up recommended.   11/01/2020 - 05/22/2022 Chemotherapy   Patient is on Treatment Plan : HEAD/NECK Pembrolizumab Q21D     07/03/2022 -  Chemotherapy   Patient is on Treatment Plan : Head and neck Pembrolizumab (400) q42d       PHYSICAL EXAMINATION: ECOG PERFORMANCE STATUS: 1 - Symptomatic but completely ambulatory  Vitals:   09/25/22 1039  BP: (!) 160/90  Pulse: (!) 18  Resp: (!) 86  SpO2: 100%   Filed Weights   09/25/22 1039  Weight: 186 lb 6.4 oz (84.6 kg)    GENERAL:alert, no distress and comfortable SKIN: skin color, texture, turgor are normal, no rashes or significant lesions EYES: normal, Conjunctiva are pink and non-injected, sclera clear OROPHARYNX:no exudate, no erythema and lips, buccal mucosa, and tongue normal  NECK: Noted fibrosis on his neck, unchanged compared to previous exam NEURO: alert &  oriented x 3 with fluent speech, no focal motor/sensory deficits  LABORATORY DATA:  I have reviewed the data as listed    Component Value Date/Time   NA 142 09/25/2022 1023   NA 139 09/22/2017 0829   K 4.1 09/25/2022 1023   K 3.5 09/22/2017 0829   CL 105 09/25/2022 1023   CO2 32 09/25/2022 1023   CO2 26 09/22/2017 0829   GLUCOSE 112 (H) 09/25/2022 1023   GLUCOSE 133 09/22/2017 0829   BUN 19 09/25/2022 1023   BUN 14.1 09/22/2017 0829   CREATININE 1.01 09/25/2022 1023  CREATININE 0.9 09/22/2017 0829   CALCIUM 9.8 09/25/2022 1023   CALCIUM 9.1 09/22/2017 0829   PROT 7.7 09/25/2022 1023   PROT 6.8 09/22/2017 0829   ALBUMIN 4.5 09/25/2022 1023   ALBUMIN 4.1 09/22/2017 0829   AST 20 09/25/2022 1023   AST 22 09/22/2017 0829   ALT 20 09/25/2022 1023   ALT 30 09/22/2017 0829   ALKPHOS 73 09/25/2022 1023   ALKPHOS 55 09/22/2017 0829   BILITOT 0.2 (L) 09/25/2022 1023   BILITOT 0.35 09/22/2017 0829   GFRNONAA >60 09/25/2022 1023   GFRAA >60 07/05/2020 0845   GFRAA >60 02/06/2019 1215    No results found for: "SPEP", "UPEP"  Lab Results  Component Value Date   WBC 6.6 09/25/2022   NEUTROABS 3.8 09/25/2022   HGB 12.7 (L) 09/25/2022   HCT 38.5 (L) 09/25/2022   MCV 87.7 09/25/2022   PLT 259 09/25/2022      Chemistry      Component Value Date/Time   NA 142 09/25/2022 1023   NA 139 09/22/2017 0829   K 4.1 09/25/2022 1023   K 3.5 09/22/2017 0829   CL 105 09/25/2022 1023   CO2 32 09/25/2022 1023   CO2 26 09/22/2017 0829   BUN 19 09/25/2022 1023   BUN 14.1 09/22/2017 0829   CREATININE 1.01 09/25/2022 1023   CREATININE 0.9 09/22/2017 0829      Component Value Date/Time   CALCIUM 9.8 09/25/2022 1023   CALCIUM 9.1 09/22/2017 0829   ALKPHOS 73 09/25/2022 1023   ALKPHOS 55 09/22/2017 0829   AST 20 09/25/2022 1023   AST 22 09/22/2017 0829   ALT 20 09/25/2022 1023   ALT 30 09/22/2017 0829   BILITOT 0.2 (L) 09/25/2022 1023   BILITOT 0.35 09/22/2017 0829

## 2022-09-25 NOTE — Assessment & Plan Note (Signed)
He will continue his best effort to monitor his carbohydrate intake We will continue his current medication

## 2022-09-26 LAB — T4: T4, Total: 8.1 ug/dL (ref 4.5–12.0)

## 2022-09-27 ENCOUNTER — Other Ambulatory Visit: Payer: Self-pay

## 2022-09-30 ENCOUNTER — Other Ambulatory Visit (HOSPITAL_COMMUNITY): Payer: Self-pay

## 2022-10-01 ENCOUNTER — Other Ambulatory Visit: Payer: Self-pay | Admitting: Nurse Practitioner

## 2022-10-01 ENCOUNTER — Other Ambulatory Visit: Payer: Self-pay | Admitting: Hematology and Oncology

## 2022-10-01 ENCOUNTER — Other Ambulatory Visit (HOSPITAL_BASED_OUTPATIENT_CLINIC_OR_DEPARTMENT_OTHER): Payer: Self-pay

## 2022-10-01 ENCOUNTER — Telehealth: Payer: Self-pay

## 2022-10-01 DIAGNOSIS — C760 Malignant neoplasm of head, face and neck: Secondary | ICD-10-CM

## 2022-10-01 MED ORDER — METHADONE HCL 10 MG PO TABS
10.0000 mg | ORAL_TABLET | Freq: Two times a day (BID) | ORAL | 0 refills | Status: DC
  Filled 2022-10-01: qty 60, 30d supply, fill #0

## 2022-10-01 NOTE — Telephone Encounter (Signed)
T/C from pt requesting a refill for his methadone 10 mg.  Request sent to Dr on call Hillery Aldo) and was sent by Ned Card, NP.  LM for pt RX has been sent to his pharmacy.

## 2022-10-09 ENCOUNTER — Ambulatory Visit (HOSPITAL_COMMUNITY)
Admission: RE | Admit: 2022-10-09 | Discharge: 2022-10-09 | Disposition: A | Discharge status: Home / Self Care | Payer: Medicaid Other | Source: Ambulatory Visit | Attending: Hematology and Oncology | Admitting: Hematology and Oncology

## 2022-10-09 ENCOUNTER — Other Ambulatory Visit: Payer: Self-pay | Admitting: Hematology and Oncology

## 2022-10-09 DIAGNOSIS — Z95828 Presence of other vascular implants and grafts: Secondary | ICD-10-CM

## 2022-10-09 DIAGNOSIS — Z452 Encounter for adjustment and management of vascular access device: Secondary | ICD-10-CM | POA: Insufficient documentation

## 2022-10-09 HISTORY — PX: IR CV LINE INJECTION: IMG2294

## 2022-10-09 MED ORDER — IOHEXOL 300 MG/ML  SOLN
50.0000 mL | Freq: Once | INTRAMUSCULAR | Status: AC | PRN
  Administered 2022-10-09: 15 mL via INTRAVENOUS

## 2022-10-09 MED ORDER — ANTICOAGULANT SODIUM CITRATE 4% (200MG/5ML) IV SOLN
5.0000 mL | Freq: Once | Status: AC
  Administered 2022-10-09: 5 mL
  Filled 2022-10-09 (×2): qty 5

## 2022-10-09 MED ORDER — HEPARIN SOD (PORK) LOCK FLUSH 100 UNIT/ML IV SOLN
INTRAVENOUS | Status: AC
  Filled 2022-10-09: qty 5

## 2022-10-09 NOTE — Procedures (Signed)
Interventional Radiology Procedure Note  Procedure: port injection    Complications: None  Estimated Blood Loss:  0  Findings: Tip prox RA Widely patent Able to w/d blood also.     Tamera Punt, MD

## 2022-10-13 ENCOUNTER — Other Ambulatory Visit: Payer: Self-pay

## 2022-10-13 ENCOUNTER — Other Ambulatory Visit (HOSPITAL_BASED_OUTPATIENT_CLINIC_OR_DEPARTMENT_OTHER): Payer: Self-pay

## 2022-10-21 ENCOUNTER — Other Ambulatory Visit: Payer: Self-pay | Admitting: Hematology

## 2022-10-21 ENCOUNTER — Other Ambulatory Visit (HOSPITAL_BASED_OUTPATIENT_CLINIC_OR_DEPARTMENT_OTHER): Payer: Self-pay

## 2022-10-21 ENCOUNTER — Encounter: Payer: Self-pay | Admitting: Hematology and Oncology

## 2022-10-21 MED ORDER — OXYCODONE HCL 5 MG PO TABS
5.0000 mg | ORAL_TABLET | Freq: Four times a day (QID) | ORAL | 0 refills | Status: DC | PRN
  Filled 2022-10-21: qty 90, 23d supply, fill #0

## 2022-10-23 ENCOUNTER — Other Ambulatory Visit (HOSPITAL_BASED_OUTPATIENT_CLINIC_OR_DEPARTMENT_OTHER): Payer: Self-pay

## 2022-10-23 ENCOUNTER — Encounter: Payer: Self-pay | Admitting: Hematology and Oncology

## 2022-10-26 ENCOUNTER — Telehealth: Payer: Self-pay | Admitting: Hematology and Oncology

## 2022-10-26 ENCOUNTER — Telehealth: Payer: Self-pay

## 2022-10-26 NOTE — Telephone Encounter (Signed)
He called and requested appt this week for treatment. He had port checked in IR on 1/5 and no problems with port. Spoke with scheduling and they will work on appts.

## 2022-10-26 NOTE — Telephone Encounter (Signed)
Scheduled appointment per patients request. Patient is aware.

## 2022-10-27 ENCOUNTER — Other Ambulatory Visit: Payer: Self-pay | Admitting: Hematology and Oncology

## 2022-10-27 NOTE — Telephone Encounter (Signed)
The orders are messed up now, no p[robelm, I will get scheduler to fix it

## 2022-10-30 ENCOUNTER — Inpatient Hospital Stay: Payer: Medicaid Other

## 2022-10-30 ENCOUNTER — Ambulatory Visit: Payer: Medicaid Other | Admitting: Hematology and Oncology

## 2022-10-30 ENCOUNTER — Inpatient Hospital Stay: Payer: Medicaid Other | Attending: Hematology and Oncology

## 2022-10-30 VITALS — BP 134/82 | HR 72 | Temp 98.2°F | Resp 18 | Ht 69.0 in | Wt 185.2 lb

## 2022-10-30 DIAGNOSIS — C760 Malignant neoplasm of head, face and neck: Secondary | ICD-10-CM

## 2022-10-30 DIAGNOSIS — C119 Malignant neoplasm of nasopharynx, unspecified: Secondary | ICD-10-CM | POA: Diagnosis present

## 2022-10-30 DIAGNOSIS — Z5112 Encounter for antineoplastic immunotherapy: Secondary | ICD-10-CM | POA: Diagnosis present

## 2022-10-30 DIAGNOSIS — Z7189 Other specified counseling: Secondary | ICD-10-CM

## 2022-10-30 DIAGNOSIS — Z95828 Presence of other vascular implants and grafts: Secondary | ICD-10-CM

## 2022-10-30 DIAGNOSIS — Z79899 Other long term (current) drug therapy: Secondary | ICD-10-CM | POA: Diagnosis not present

## 2022-10-30 DIAGNOSIS — C7800 Secondary malignant neoplasm of unspecified lung: Secondary | ICD-10-CM

## 2022-10-30 LAB — CBC WITH DIFFERENTIAL (CANCER CENTER ONLY)
Abs Immature Granulocytes: 0.01 10*3/uL (ref 0.00–0.07)
Basophils Absolute: 0 10*3/uL (ref 0.0–0.1)
Basophils Relative: 1 %
Eosinophils Absolute: 0.2 10*3/uL (ref 0.0–0.5)
Eosinophils Relative: 4 %
HCT: 36.2 % — ABNORMAL LOW (ref 39.0–52.0)
Hemoglobin: 12.1 g/dL — ABNORMAL LOW (ref 13.0–17.0)
Immature Granulocytes: 0 %
Lymphocytes Relative: 32 %
Lymphs Abs: 1.6 10*3/uL (ref 0.7–4.0)
MCH: 28.7 pg (ref 26.0–34.0)
MCHC: 33.4 g/dL (ref 30.0–36.0)
MCV: 85.8 fL (ref 80.0–100.0)
Monocytes Absolute: 0.4 10*3/uL (ref 0.1–1.0)
Monocytes Relative: 9 %
Neutro Abs: 2.7 10*3/uL (ref 1.7–7.7)
Neutrophils Relative %: 54 %
Platelet Count: 268 10*3/uL (ref 150–400)
RBC: 4.22 MIL/uL (ref 4.22–5.81)
RDW: 13.4 % (ref 11.5–15.5)
WBC Count: 5 10*3/uL (ref 4.0–10.5)
nRBC: 0 % (ref 0.0–0.2)

## 2022-10-30 LAB — CMP (CANCER CENTER ONLY)
ALT: 17 U/L (ref 0–44)
AST: 19 U/L (ref 15–41)
Albumin: 4 g/dL (ref 3.5–5.0)
Alkaline Phosphatase: 69 U/L (ref 38–126)
Anion gap: 4 — ABNORMAL LOW (ref 5–15)
BUN: 15 mg/dL (ref 6–20)
CO2: 31 mmol/L (ref 22–32)
Calcium: 9.3 mg/dL (ref 8.9–10.3)
Chloride: 105 mmol/L (ref 98–111)
Creatinine: 0.82 mg/dL (ref 0.61–1.24)
GFR, Estimated: >60 mL/min (ref >60)
Glucose, Bld: 94 mg/dL (ref 70–99)
Potassium: 3.8 mmol/L (ref 3.5–5.1)
Sodium: 140 mmol/L (ref 135–145)
Total Bilirubin: 0.2 mg/dL — ABNORMAL LOW (ref 0.3–1.2)
Total Protein: 7 g/dL (ref 6.5–8.1)

## 2022-10-30 LAB — TSH: TSH: 2.151 u[IU]/mL (ref 0.350–4.500)

## 2022-10-30 MED ORDER — SODIUM CHLORIDE 0.9% FLUSH
10.0000 mL | INTRAVENOUS | Status: DC | PRN
  Administered 2022-10-30: 10 mL

## 2022-10-30 MED ORDER — SODIUM CHLORIDE 0.9 % IV SOLN: Freq: Once | INTRAVENOUS | Status: AC

## 2022-10-30 MED ORDER — ONDANSETRON HCL 4 MG/2ML IJ SOLN
8.0000 mg | Freq: Once | INTRAMUSCULAR | Status: AC
  Administered 2022-10-30: 8 mg via INTRAVENOUS
  Filled 2022-10-30: qty 4

## 2022-10-30 MED ORDER — SODIUM CHLORIDE 0.9 % IV SOLN
400.0000 mg | Freq: Once | INTRAVENOUS | Status: AC
  Administered 2022-10-30: 400 mg via INTRAVENOUS
  Filled 2022-10-30: qty 16

## 2022-10-30 MED ORDER — SODIUM CHLORIDE 0.9% FLUSH
10.0000 mL | Freq: Once | INTRAVENOUS | Status: AC
  Administered 2022-10-30: 10 mL

## 2022-10-30 NOTE — Patient Instructions (Signed)

## 2022-11-02 ENCOUNTER — Other Ambulatory Visit: Payer: Self-pay | Admitting: Hematology and Oncology

## 2022-11-02 ENCOUNTER — Other Ambulatory Visit (HOSPITAL_BASED_OUTPATIENT_CLINIC_OR_DEPARTMENT_OTHER): Payer: Self-pay

## 2022-11-02 ENCOUNTER — Telehealth: Payer: Self-pay

## 2022-11-02 DIAGNOSIS — C760 Malignant neoplasm of head, face and neck: Secondary | ICD-10-CM

## 2022-11-02 MED ORDER — METHADONE HCL 10 MG PO TABS
10.0000 mg | ORAL_TABLET | Freq: Two times a day (BID) | ORAL | 0 refills | Status: DC
  Filled 2022-11-02: qty 60, 30d supply, fill #0

## 2022-11-02 NOTE — Telephone Encounter (Signed)
Called and given below message. He verbalized understanding and will look for the misplaced oxycodone.

## 2022-11-02 NOTE — Telephone Encounter (Signed)
I sent refill for methadone Nothing I can do for his lost prescription

## 2022-11-02 NOTE — Telephone Encounter (Signed)
He called and left a message requesting Methadone refill to [facility].  He has misplaced oxycodone Rx and is asking about refill. Last filled on 1/17. [facility] and the earliest that he can get refill is 2/6.

## 2022-11-03 ENCOUNTER — Telehealth: Payer: Self-pay | Admitting: Hematology and Oncology

## 2022-11-03 LAB — T4: T4, Total: 7 ug/dL (ref 4.5–12.0)

## 2022-11-03 NOTE — Telephone Encounter (Signed)
Scheduled appointment per WQ. Patient is aware of the made appointments. 

## 2022-11-04 ENCOUNTER — Other Ambulatory Visit: Payer: Self-pay

## 2022-11-06 ENCOUNTER — Ambulatory Visit: Payer: Medicaid Other

## 2022-11-06 ENCOUNTER — Ambulatory Visit: Payer: Medicaid Other | Admitting: Hematology and Oncology

## 2022-11-06 ENCOUNTER — Other Ambulatory Visit: Payer: Medicaid Other

## 2022-11-09 ENCOUNTER — Other Ambulatory Visit (HOSPITAL_BASED_OUTPATIENT_CLINIC_OR_DEPARTMENT_OTHER): Payer: Self-pay

## 2022-11-10 ENCOUNTER — Other Ambulatory Visit: Payer: Self-pay

## 2022-11-10 ENCOUNTER — Encounter: Payer: Self-pay | Admitting: Hematology and Oncology

## 2022-11-10 ENCOUNTER — Inpatient Hospital Stay: Payer: Medicaid Other | Attending: Hematology and Oncology | Admitting: Hematology and Oncology

## 2022-11-10 ENCOUNTER — Telehealth: Payer: Self-pay

## 2022-11-10 VITALS — BP 146/93 | HR 91 | Resp 18 | Ht 69.0 in | Wt 187.6 lb

## 2022-11-10 DIAGNOSIS — C119 Malignant neoplasm of nasopharynx, unspecified: Secondary | ICD-10-CM | POA: Insufficient documentation

## 2022-11-10 DIAGNOSIS — C760 Malignant neoplasm of head, face and neck: Secondary | ICD-10-CM | POA: Diagnosis not present

## 2022-11-10 DIAGNOSIS — M542 Cervicalgia: Secondary | ICD-10-CM | POA: Diagnosis not present

## 2022-11-10 DIAGNOSIS — C7989 Secondary malignant neoplasm of other specified sites: Secondary | ICD-10-CM | POA: Insufficient documentation

## 2022-11-10 DIAGNOSIS — G893 Neoplasm related pain (acute) (chronic): Secondary | ICD-10-CM | POA: Diagnosis not present

## 2022-11-10 NOTE — Progress Notes (Signed)
[place] OFFICE PROGRESS NOTE  Patient Care Team: Patient, No Pcp Per as PCP - General (General Practice) [name], MD as Consulting Physician (Hematology and Oncology) Eppie Gibson, MD as Attending Physician (Radiation Oncology) Jodi Marble, MD as Consulting Physician (Otolaryngology) Philomena Doheny, MD as Referring Physician (Plastic Surgery) Irene Shipper, MD as Consulting Physician (Gastroenterology) System, Provider Not In  ASSESSMENT & PLAN:  Malignant neoplasm of head and neck [facility]) Unfortunately, the abnormal findings on his neck represent cancer recurrence The patient felt that his cancer recurred due to missing treatment recently I plan to reexamine him again next week If the disease is not resolving, I would recommend we switch him to different treatment In the meantime, we will focus on supportive care  Cancer associated pain His pain is well-controlled He will continue current prescribed pain medicine  No orders of the defined types were placed in this encounter.   All questions were answered. The patient knows to call the clinic with any problems, questions or concerns. The total time spent in the appointment was 20 minutes encounter with patients including review of chart and various tests results, discussions about plan of care and coordination of care plan   [name], MD 11/10/2022 3:51 PM  INTERVAL HISTORY: Please see below for problem oriented charting. he returns for urgent evaluation Approximately a week ago, he noticed significant pain on the right side of his neck and he noted abnormal mass developing at the previous site of cancer recurrence His treatment was delayed due to port malfunction As soon as he received treatment, within 3 days, the pain on the right side of his neck has subsided  REVIEW OF SYSTEMS:   Constitutional: Denies fevers, chills or abnormal weight loss Eyes: Denies blurriness of vision Ears, nose, mouth,  throat, and face: Denies mucositis or sore throat Respiratory: Denies cough, dyspnea or wheezes Cardiovascular: Denies palpitation, chest discomfort or lower extremity swelling Gastrointestinal:  Denies nausea, heartburn or change in bowel habits Skin: Denies abnormal skin rashes Lymphatics: Denies new lymphadenopathy or easy bruising Neurological:Denies numbness, tingling or new weaknesses Behavioral/Psych: Mood is stable, no new changes  All other systems were reviewed with the patient and are negative.  I have reviewed the past medical history, past surgical history, social history and family history with the patient and they are unchanged from previous note.  ALLERGIES:  is allergic to phenergan [promethazine hcl], heparin, and clindamycin.  MEDICATIONS:  Current Outpatient Medications  Medication Sig Dispense Refill   blood glucose meter kit and supplies KIT Dispense based on patient and insurance preference. Use up to four times daily as directed. (FOR ICD-9 250.00, 250.01). 1 each 1   cetirizine (ZYRTEC) 10 MG tablet Take 1 tablet (10 mg total) by mouth daily as needed for allergies. 100 tablet 0   levothyroxine (SYNTHROID) 125 MCG tablet Take 1 tablet (125 mcg total) by mouth daily before breakfast. 60 tablet 1   metFORMIN (GLUCOPHAGE) 500 MG tablet Take 1 tablet (500 mg total) by mouth 2 (two) times daily with a meal. 60 tablet 2   methadone (DOLOPHINE) 10 MG tablet Take 1 tablet (10 mg total) by mouth every 12 (twelve) hours. 60 tablet 0   ondansetron (ZOFRAN) 8 MG tablet Take 1 tablet (8 mg total) by mouth every 8 (eight) hours as needed for nausea or vomiting. 90 tablet 1   oxyCODONE (OXY IR/ROXICODONE) 5 MG immediate release tablet Take 1 tablet (5 mg total) by mouth every 6 (  six) hours as needed for severe pain. 90 tablet 0   No current facility-administered medications for this visit.   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 Pan-endoscopy elsewhere showed cancer from nasopharyngeal space.   01/04/2007 - 02/20/2007 Chemotherapy   He received 2 cycles of cisplatin and 5FU followed by concurrent chemo with weekly cisplatin and radiation. He only received 2 doses of chemo due to severe mucositis, nausea and weight loss.   04/05/2007 - 08/04/2007 Chemotherapy   He received 4 more courses of cisplatin with 5FU and had complete response   07/05/2009 Procedure   Fine-needle aspirate of the right level II lymph nodes come from recurrent metastatic disease. Repeat endoscopy and CT scan show no evidence of disease elsewhere.   07/08/2009 - 12/02/2009 Chemotherapy   He was given 6 cycles of carboplatin, 5-FU and docetaxel   12/03/2009 Surgery   He has surgery to the residual lymph node on the right neck which showed no evidence of disease.   02/22/2012 Imaging   Repeat imaging study showed large recurrent mass. He was referred elsewhere for further treatment.   05/03/2012 Surgery   He underwent left upper lobectomy.   04/29/2013 Imaging   PEt scan showed lesion on right level II B and lower lung was abnormal   06/03/2013 - 02/02/2014 Chemotherapy   He had 6 cycles of chemotherapy  when he was found to have recurrence of cancer and had received oxaliplatin and capecitabine   06/07/2014 Imaging   PET CT scan showed persistent disease in the right neck lymph nodes and left lung   06/29/2014 Procedure   Accession: [id number] repeat LUL biopsy confirmed metastatic cancer   07/18/2014 - 07/31/2014 Radiation Therapy   He received palliative radiation therapy to the lungs   10/10/2014 Imaging   CT scan of the chest, abdomen and pelvis show regression in the size of the lung nodule in the left upper lobe and stable pulmonary nodules   01/24/2015 Imaging   CT scan showed stable disease in neck and lung   06/19/2015 Imaging   CT scan of the neck and the chest show possible mild progression of the nodule in the right side of the neck.   06/25/2015 Imaging   PET scan confirmed disease recurrence in the neck   07/07/2015 Imaging   He had MRI neck at [facility]   09/03/2015 - 08/26/2018 Chemotherapy   He received palliative chemo with Nivolumab   10/29/2015 Imaging   PET CT showed positive response to Rx   02/28/2016 Imaging   Ct abdomen showed abnormal thinkening in his stomach   03/03/2016 Imaging  CT: Right sternocleidomastoid muscle metastasis appears less distinct but otherwise not significantly changed in size or configuration since 06/19/2015.2. Left level 3 lymph node which was hypermetabolic by PET-CT in January 2017 appears slightly smaller   04/01/2016 Imaging   CT cervical spine showed no acute fracture or traumatic malalignment in the cervical spine   04/22/2016 Procedure   Port-a-cath placed.   06/16/2016 Imaging   Ct neck showed right sternocleidomastoid muscle metastasis is further decreased in conspicuity since May, and has mildly decreased in size since September 2016. Continued stability of sub-centimeter left cervical lymph nodes. No new or progressive metastatic disease in the neck.   06/16/2016 Imaging   CT chest showed stable masslike radiation fibrosis in  the left upper lobe. Stable subcentimeter pulmonary nodules in the bilateral lower lobes. No new or progressive metastatic disease in the chest. Nonobstructing left renal stone.   10/13/2016 Imaging   Ct neck showed unchanged right sternocleidomastoid muscle metastasis. Unchanged subcentimeter left cervical lymph nodes. No evidence of new or progressive metastatic disease in the neck.   10/13/2016 Imaging   CT chest showed tiny hypervascular foci in the liver, not definitely seen on prior imaging of 06/16/2016 and 02/28/2016. Abdomen MRI without and with contrast recommended to further evaluate as metastatic disease is a concern. 2. Stable appearance of post treatment changes left upper lung and scattered tiny bilateral pulmonary nodules.   02/11/2017 Imaging   Ct neck: Lymph node mass right posterior neck appears improved from the prior study. Small posterior lymph nodes on the left unchanged. Occluded right jugular vein unchanged.   02/11/2017 Imaging   1. Similar appearance of postsurgical and radiation changes in the left upper lobe. 2. Similar bilateral pulmonary nodules. 3. No thoracic adenopathy. 4. Subtle foci of post-contrast enhancement within the liver are suboptimally characterized on this nondedicated study. Likely similar. These could either be re-evaluated at followup or more entirely characterized with abdominal MRI. 5. Left nephrolithiasis.   05/19/2017 Imaging   Matted lymph node mass right posterior neck appears larger in the recent CT. Accurate measurements difficult due to infiltrating tumor margins and infiltration of the muscle. Right jugular vein again appears occluded or resected. Small left posterior lymph nodes stable. Left upper lobe airspace density stable and similar to the prior CT   06/03/2017 PET scan   1. Hypermetabolic ill-defined right level IIb lymph node, about 1.3 cm in diameter with maximum SUV 9.5 (formerly 8.1). Appearance suspicious for residual/recurrent  malignancy. No worrisome left-sided lesion. 2. Left suprahilar indistinct opacity demonstrates no worrisome hypermetabolic activity. The 5 mm left lower lobe pulmonary nodule is stable and not currently hypermetabolic although below sensitive PET-CT size thresholds. 3. Other imaging findings of potential clinical significance: Bilateral nonobstructive nephrolithiasis. Chronic bilateral maxillary sinusitis.   05/11/2018 PET scan   1. Continued chronic accentuated metabolic activity in the vicinity of right level IIB and the adjacent right sternocleidomastoid muscle, with ill definition of surrounding tissue planes. Maximum SUV is currently 8.1, formerly 9.5. Accentuated metabolic activity is been present in this vicinity back through 06/25/2015, and there was also some low-level activity in this vicinity on 06/07/2014. Some of this may be from scarring and local muscular activity although clearly a component of residual tumor is difficult to exclude given the focally high activity. 2. Other imaging findings of potential clinical significance: Chronic bilateral maxillary sinusitis. Chronic scarring in the left upper lobe. Chronically stable 5 mm left lower lobe nodule is considered benign. Nonobstructive left nephrolithiasis.   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 COMMENT:No significant keratinization is identified. Other basaloid carcinomas are in the differential diagnosis. No cell block material is available for further testing.   09/12/2018 Procedure   He underwent fine Needle Aspiration   10/04/2018 PET scan   1. Significant progression of local recurrence laterally in the mid right neck with an enlarging, increasingly hypermetabolic soft tissue mass. This involves the right sternocleidomastoid muscle. 2. Small lymph nodes in the right axilla are  increasingly hypermetabolic. These are nonspecific and potentially reactive, although could reflect a small metastases. Small hypermetabolic nodule in the left suprasternal notch is unchanged. 3. No other evidence of metastatic disease.     10/07/2018 - 12/23/2018 Chemotherapy   The patient had cisplatin plus gemzar   12/07/2018 Imaging   1. Decreased size of lateral right neck mass. 2. Unchanged soft tissue nodule in the suprasternal notch. 3. No evidence of new metastatic disease in the neck.     05/30/2019 Imaging   CT neck No clear change or progression compared to the study of March. Overall measurements of the right lateral neck mass are similar, approximately 3 x 1.8 cm. See above discussion. One could argue that there is slight increase in lateral bulging, possibly with an increase in contrast enhancement, towards the inferior margin. This is of questionable validity but could possibly represent some progression or inflammatory change. Other findings in the region are stable.   07/20/2019 - 09/15/2019 Chemotherapy   The patient had dexamethasone (DECADRON) 4 MG tablet, 1 of 1 cycle, Start date: --, End date: -- palonosetron (ALOXI) injection 0.25 mg, 0.25 mg, Intravenous,  Once, 3 of 4 cycles Administration: 0.25 mg (07/20/2019), 0.25 mg (08/10/2019), 0.25 mg (09/08/2019) CISplatin (PLATINOL) 84 mg in sodium chloride 0.9 % 250 mL chemo infusion, 40 mg/m2 = 84 mg (80 % of original dose 50 mg/m2), Intravenous,  Once, 3 of 4 cycles Dose modification: 40 mg/m2 (80 % of original dose 50 mg/m2, Cycle 1, Reason: Dose Not Tolerated) Administration: 84 mg (07/20/2019), 84 mg (08/10/2019), 83 mg (09/08/2019) gemcitabine (GEMZAR) 1,600 mg in sodium chloride 0.9 % 250 mL chemo infusion, 1,672 mg (80 % of original dose 1,000 mg/m2), Intravenous,  Once, 3 of 4 cycles Dose modification: 800 mg/m2 (80 % of original dose 1,000 mg/m2, Cycle 1, Reason: Provider Judgment) Administration: 1,600 mg (07/20/2019),  1,600 mg (07/27/2019), 1,600 mg (08/10/2019), 1,600 mg (08/17/2019), 1,600 mg (09/08/2019), 1,672 mg (09/15/2019) ondansetron (ZOFRAN) 8 mg, dexamethasone (DECADRON) 10 mg in sodium chloride 0.9 % 50 mL IVPB, , Intravenous,  Once, 3 of 4 cycles Administration:  (09/15/2019) fosaprepitant (EMEND) 150 mg, dexamethasone (DECADRON) 12 mg in sodium chloride 0.9 % 145 mL IVPB, , Intravenous,  Once, 3 of 4 cycles Administration:  (07/20/2019),  (08/10/2019),  (09/08/2019)  for chemotherapy treatment.    10/02/2019 Imaging   CT neck As compared to 05/30/2019, no significant interval change in size of an ill-defined mass within the right lateral neck, again measuring 3.3 x 1.8 cm in transaxial dimensions.   Unchanged mildly enlarged left level I lymph node measuring 1.1 cm in short axis.   Unchanged node or nodule at the thoracic inlet, measuring 1.3 x 0.8 cm.   Please refer to concurrently performed chest CT for a description of findings below the level of the thoracic inlet.     10/02/2019 Imaging  CT chest 1. No new or progressive findings in the chest to suggest metastatic disease. 2. Bilateral subcentimeter solid pulmonary nodules are stable since 2018. 3. Hyperdense 1.1 cm anterior liver focus, not clearly visualized on prior studies. Suggest MRI abdomen without and with IV contrast for further characterization.   10/20/2019 - 12/29/2019 Chemotherapy   The patient had ondansetron (ZOFRAN) injection 8 mg, 8 mg (100 % of original dose 8 mg), Intravenous,  Once, 2 of 5 cycles Dose modification: 8 mg (original dose 8 mg, Cycle 2) Administration: 8 mg (11/17/2019), 8 mg (12/15/2019), 8 mg (12/29/2019) gemcitabine (GEMZAR) 2,000 mg in sodium chloride 0.9 % 250 mL chemo infusion, 2,090 mg, Intravenous,  Once, 3 of 6 cycles Administration: 2,000 mg (10/20/2019), 2,000 mg (11/03/2019), 2,000 mg (11/17/2019), 2,000 mg (12/15/2019), 2,000 mg (12/29/2019)  for chemotherapy treatment.    06/12/2020 Imaging   1.  Enlarging superficial, exophytic component of the chronic right sternocleidomastoid muscle mass. See series 6, image 55. 2. Elsewhere stable CT appearance of the Neck.   06/12/2020 Imaging   Post treatment scarring in the left hemithorax, stable. No evidence recurrent or metastatic disease   06/14/2020 - 10/15/2020 Chemotherapy   He received carboplatin, 5FU and Beryle Flock       10/31/2020 Procedure   Interval improvement in right lateral lymph node mass. Improvement in dermal component as well as invasion of the right sternocleidomastoid muscle.   10 mm submental lymph node slightly enlarged compared to the prior study. Continued follow-up recommended.   11/01/2020 - 05/22/2022 Chemotherapy   Patient is on Treatment Plan : HEAD/NECK Pembrolizumab Q21D     07/03/2022 -  Chemotherapy   Patient is on Treatment Plan : Head and neck Pembrolizumab (400) q42d       PHYSICAL EXAMINATION: ECOG PERFORMANCE STATUS: 1 - Symptomatic but completely ambulatory  Vitals:   11/10/22 1347  BP: (!) 146/93  Pulse: 91  Resp: 18  SpO2: 99%   Filed Weights   11/10/22 1347  Weight: 187 lb 9.6 oz (85.1 kg)    GENERAL:alert, no distress and comfortable SKIN: skin color, texture, turgor are normal, no rashes or significant lesions EYES: normal, Conjunctiva are pink and non-injected, sclera clear OROPHARYNX:no exudate, no erythema and lips, buccal mucosa, and tongue normal  NECK there is abnormal appearance with growth on the right side of his neck compatible with disease recurrence  NEURO: alert & oriented x 3 with fluent speech, no focal motor/sensory deficits  LABORATORY DATA:  I have reviewed the data as listed    Component Value Date/Time   NA 140 10/30/2022 1214   NA 139 09/22/2017 0829   K 3.8 10/30/2022 1214   K 3.5 09/22/2017 0829   CL 105 10/30/2022 1214   CO2 31 10/30/2022 1214   CO2 26 09/22/2017 0829   GLUCOSE 94 10/30/2022 1214   GLUCOSE 133 09/22/2017 0829   BUN 15 10/30/2022  1214   BUN 14.1 09/22/2017 0829   CREATININE 0.82 10/30/2022 1214   CREATININE 0.9 09/22/2017 0829   CALCIUM 9.3 10/30/2022 1214   CALCIUM 9.1 09/22/2017 0829   PROT 7.0 10/30/2022 1214   PROT 6.8 09/22/2017 0829   ALBUMIN 4.0 10/30/2022 1214   ALBUMIN 4.1 09/22/2017 0829   AST 19 10/30/2022 1214   AST 22 09/22/2017 0829   ALT 17 10/30/2022 1214   ALT 30 09/22/2017 0829   ALKPHOS 69 10/30/2022 1214   ALKPHOS 55 09/22/2017 0829   BILITOT 0.2 (L) 10/30/2022 1214   BILITOT  0.35 09/22/2017 0829   GFRNONAA >60 10/30/2022 1214   GFRAA >60 07/05/2020 0845   GFRAA >60 02/06/2019 1215    No results found for: "SPEP", "UPEP"  Lab Results  Component Value Date   WBC 5.0 10/30/2022   NEUTROABS 2.7 10/30/2022   HGB 12.1 (L) 10/30/2022   HCT 36.2 (L) 10/30/2022   MCV 85.8 10/30/2022   PLT 268 10/30/2022      Chemistry      Component Value Date/Time   NA 140 10/30/2022 1214   NA 139 09/22/2017 0829   K 3.8 10/30/2022 1214   K 3.5 09/22/2017 0829   CL 105 10/30/2022 1214   CO2 31 10/30/2022 1214   CO2 26 09/22/2017 0829   BUN 15 10/30/2022 1214   BUN 14.1 09/22/2017 0829   CREATININE 0.82 10/30/2022 1214   CREATININE 0.9 09/22/2017 0829      Component Value Date/Time   CALCIUM 9.3 10/30/2022 1214   CALCIUM 9.1 09/22/2017 0829   ALKPHOS 69 10/30/2022 1214   ALKPHOS 55 09/22/2017 0829   AST 19 10/30/2022 1214   AST 22 09/22/2017 0829   ALT 17 10/30/2022 1214   ALT 30 09/22/2017 0829   BILITOT 0.2 (L) 10/30/2022 1214   BILITOT 0.35 09/22/2017 0829

## 2022-11-10 NOTE — Telephone Encounter (Signed)
Called back and scheduled appt at 1:45 pm today with Dr. Alvy Bimler. He is aware of the appt and appreciated the call.

## 2022-11-10 NOTE — Assessment & Plan Note (Signed)
His pain is well-controlled He will continue current prescribed pain medicine

## 2022-11-10 NOTE — Telephone Encounter (Signed)
I can see him at 145 today or Thursday morning there is an opening

## 2022-11-10 NOTE — Assessment & Plan Note (Signed)
Unfortunately, the abnormal findings on his neck represent cancer recurrence The patient felt that his cancer recurred due to missing treatment recently I plan to reexamine him again next week If the disease is not resolving, I would recommend we switch him to different treatment In the meantime, we will focus on supportive care

## 2022-11-10 NOTE — Telephone Encounter (Signed)
Returned his call. He is asking for a urgent appt with Dr. Alvy Bimler. He does not want to talk about what is going on over phone. He is available whenever Dr. Alvy Bimler has time to see him.

## 2022-11-12 ENCOUNTER — Other Ambulatory Visit (HOSPITAL_BASED_OUTPATIENT_CLINIC_OR_DEPARTMENT_OTHER): Payer: Self-pay

## 2022-11-16 ENCOUNTER — Telehealth: Payer: Self-pay

## 2022-11-16 ENCOUNTER — Encounter: Payer: Self-pay | Admitting: Hematology and Oncology

## 2022-11-16 ENCOUNTER — Other Ambulatory Visit (HOSPITAL_BASED_OUTPATIENT_CLINIC_OR_DEPARTMENT_OTHER): Payer: Self-pay

## 2022-11-16 ENCOUNTER — Other Ambulatory Visit: Payer: Self-pay | Admitting: Hematology and Oncology

## 2022-11-16 ENCOUNTER — Other Ambulatory Visit: Payer: Self-pay

## 2022-11-16 MED ORDER — OXYCODONE HCL 10 MG PO TABS
10.0000 mg | ORAL_TABLET | ORAL | 0 refills | Status: DC | PRN
  Filled 2022-11-16: qty 90, 15d supply, fill #0

## 2022-11-16 NOTE — Telephone Encounter (Signed)
Notified Patient by voicemail of prior authorization approval for Oxycodone HCL 10mg  Tablets. Medication is approved through 05/15/2023.

## 2022-11-16 NOTE — Telephone Encounter (Signed)
Called and told him 10 mg Rx sent to pharmacy. He verbalized understanding.

## 2022-11-16 NOTE — Telephone Encounter (Signed)
Yes, I will increase to 10 mg dose and reassess in his next visit

## 2022-11-16 NOTE — Telephone Encounter (Signed)
He called and left a message. He needs a refill on the Oxycodone. The 5 mg tablets are not helping the pain. He is asking for increase to 10 mg or if Dr. Alvy Bimler has another suggestion on the pain medication.

## 2022-11-20 ENCOUNTER — Other Ambulatory Visit (HOSPITAL_BASED_OUTPATIENT_CLINIC_OR_DEPARTMENT_OTHER): Payer: Self-pay

## 2022-11-20 ENCOUNTER — Encounter: Payer: Self-pay | Admitting: Hematology and Oncology

## 2022-11-20 ENCOUNTER — Other Ambulatory Visit: Payer: Self-pay

## 2022-11-20 ENCOUNTER — Inpatient Hospital Stay: Payer: Medicaid Other | Admitting: Hematology and Oncology

## 2022-11-20 VITALS — BP 123/85 | HR 95 | Temp 97.8°F | Resp 18 | Ht 69.0 in | Wt 186.4 lb

## 2022-11-20 DIAGNOSIS — G893 Neoplasm related pain (acute) (chronic): Secondary | ICD-10-CM | POA: Diagnosis not present

## 2022-11-20 DIAGNOSIS — C119 Malignant neoplasm of nasopharynx, unspecified: Secondary | ICD-10-CM | POA: Diagnosis not present

## 2022-11-20 DIAGNOSIS — C7800 Secondary malignant neoplasm of unspecified lung: Secondary | ICD-10-CM | POA: Diagnosis not present

## 2022-11-20 DIAGNOSIS — C760 Malignant neoplasm of head, face and neck: Secondary | ICD-10-CM

## 2022-11-20 MED ORDER — METHADONE HCL 10 MG PO TABS
10.0000 mg | ORAL_TABLET | Freq: Two times a day (BID) | ORAL | 0 refills | Status: DC
  Filled 2022-11-20 – 2022-11-30 (×2): qty 60, 30d supply, fill #0

## 2022-11-20 MED ORDER — SENNOSIDES-DOCUSATE SODIUM 8.6-50 MG PO TABS
2.0000 | ORAL_TABLET | Freq: Two times a day (BID) | ORAL | 3 refills | Status: DC
  Filled 2022-11-20: qty 100, 25d supply, fill #0

## 2022-11-20 NOTE — Progress Notes (Signed)
[place] OFFICE PROGRESS NOTE  Patient Care Team: Patient, No Pcp Per as PCP - General (General Practice) [name], MD as Consulting Physician (Hematology and Oncology) Eppie Gibson, MD as Attending Physician (Radiation Oncology) Jodi Marble, MD as Consulting Physician (Otolaryngology) Philomena Doheny, MD as Referring Physician (Plastic Surgery) Irene Shipper, MD as Consulting Physician (Gastroenterology) System, Provider Not In  ASSESSMENT & PLAN:  Malignant neoplasm of head and neck [facility]) Unfortunately, he has persistent enlarging mass on the right side of the neck, compatible with previous cancer recurrence I recommend CT imaging of the neck and chest for staging I will see him the following week to review test results and to discuss treatment options  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    Answer:   [facility]    Order Specific Question:   Radiology Contrast Protocol - do NOT remove file path    Answer:   [email or web address]   CT SOFT TISSUE NECK W CONTRAST    Standing Status:   Future    Standing Expiration Date:   11/21/2023    Order Specific Question:   If indicated for the ordered procedure, I authorize the administration of contrast media per Radiology protocol    Answer:   Yes    Order Specific Question:   Preferred imaging location?    Answer:   [facility]     All questions were answered. The patient knows to call the clinic with any problems, questions or concerns. The total time spent in the appointment was 30 minutes encounter with patients including review of chart and various tests results, discussions about plan of care and coordination of care plan   [name], MD 11/20/2022 2:56 PM  INTERVAL HISTORY: Please see below for problem oriented charting. he returns for further evaluation He has noted enlarging mass on the right side of his neck He felt that his pain is poorly controlled Denies recent cough, chest pain or shortness of breath  REVIEW OF SYSTEMS:   Constitutional: Denies fevers, chills or abnormal weight loss Eyes: Denies blurriness of vision Ears, nose, mouth, throat, and face: Denies mucositis or sore throat Respiratory: Denies cough, dyspnea or wheezes Cardiovascular: Denies palpitation, chest discomfort or lower extremity swelling Gastrointestinal:  Denies nausea, heartburn or change in bowel habits Skin: Denies abnormal skin rashes Lymphatics: Denies new lymphadenopathy or easy bruising Neurological:Denies numbness, tingling or new weaknesses Behavioral/Psych: Mood is stable, no new changes  All other systems were reviewed with the patient and are negative.  I have reviewed the past medical history, past surgical history, social history and family history with the patient and they are unchanged from previous note.  ALLERGIES:  is allergic to phenergan [promethazine hcl], heparin, and clindamycin.  MEDICATIONS:  Current Outpatient Medications  Medication Sig Dispense Refill   polyethylene glycol (MIRALAX / GLYCOLAX) 17 g packet Take 17 g by mouth 2 (  two) times daily.     senna-docusate (SENOKOT-S) 8.6-50 MG tablet Take 2 tablets by mouth 2 (two) times daily. 100 tablet 3   blood glucose meter kit and supplies KIT Dispense based on patient and insurance preference. Use up to four times daily as directed. (FOR  ICD-9 250.00, 250.01). 1 each 1   cetirizine (ZYRTEC) 10 MG tablet Take 1 tablet (10 mg total) by mouth daily as needed for allergies. 100 tablet 0   levothyroxine (SYNTHROID) 125 MCG tablet Take 1 tablet (125 mcg total) by mouth daily before breakfast. 60 tablet 1   metFORMIN (GLUCOPHAGE) 500 MG tablet Take 1 tablet (500 mg total) by mouth 2 (two) times daily with a meal. 60 tablet 2   methadone (DOLOPHINE) 10 MG tablet Take 1 tablet (10 mg total) by mouth every 12 (twelve) hours. 60 tablet 0   ondansetron (ZOFRAN) 8 MG tablet Take 1 tablet (8 mg total) by mouth every 8 (eight) hours as needed for nausea or vomiting. 90 tablet 1   Oxycodone HCl 10 MG TABS Take 1 tablet (10 mg total) by mouth every 4 (four) hours as needed for severe pain. 90 tablet 0   No current facility-administered medications for this visit.   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 Pan-endoscopy elsewhere showed cancer from nasopharyngeal space.   01/04/2007 - 02/20/2007 Chemotherapy   He received 2 cycles of cisplatin and 5FU followed by concurrent chemo with weekly cisplatin and radiation. He only received 2 doses of chemo due to  severe mucositis, nausea and weight loss.   04/05/2007 - 08/04/2007 Chemotherapy   He received 4 more courses of cisplatin with 5FU and had complete response   07/05/2009 Procedure   Fine-needle aspirate of the right level II lymph nodes come from recurrent metastatic disease. Repeat endoscopy and CT scan show no evidence of disease elsewhere.   07/08/2009 - 12/02/2009 Chemotherapy   He was given 6 cycles of carboplatin, 5-FU and docetaxel   12/03/2009 Surgery   He has surgery to the residual lymph node on the right neck which showed no evidence of disease.   02/22/2012 Imaging   Repeat imaging study showed large recurrent mass. He was referred elsewhere for further treatment.   05/03/2012 Surgery   He underwent left upper lobectomy.   04/29/2013 Imaging   PEt scan showed lesion on right level II B and lower lung was abnormal   06/03/2013 - 02/02/2014 Chemotherapy   He had 6 cycles of chemotherapy when he was found to have recurrence of cancer and had received oxaliplatin and capecitabine   06/07/2014 Imaging   PET CT scan  showed persistent disease in the right neck lymph nodes and left lung   06/29/2014 Procedure   Accession: [id number] repeat LUL biopsy confirmed metastatic cancer   07/18/2014 - 07/31/2014 Radiation Therapy   He received palliative radiation therapy to the lungs   10/10/2014 Imaging   CT scan of the chest, abdomen and pelvis show regression in the size of the lung nodule in the left upper lobe and stable pulmonary nodules   01/24/2015 Imaging   CT scan showed stable disease in neck and lung   06/19/2015 Imaging   CT scan of the neck and the chest show possible mild progression of the nodule in the right side of the neck.   06/25/2015 Imaging   PET scan confirmed disease recurrence in the neck   07/07/2015 Imaging   He had MRI neck at [facility]   09/03/2015 - 08/26/2018 Chemotherapy   He received palliative chemo with Nivolumab   10/29/2015 Imaging   PET CT showed positive  response to Rx   02/28/2016 Imaging   Ct abdomen showed abnormal thinkening in his stomach   03/03/2016 Imaging   CT: Right sternocleidomastoid muscle metastasis appears less distinct but otherwise not significantly changed in size or configuration since 06/19/2015.2. Left level 3 lymph node which was hypermetabolic by PET-CT in January 2017 appears slightly smaller   04/01/2016 Imaging   CT cervical spine showed no acute fracture or traumatic malalignment in the cervical spine   04/22/2016 Procedure   Port-a-cath placed.   06/16/2016 Imaging   Ct neck showed right sternocleidomastoid muscle metastasis is further decreased in conspicuity since May, and has mildly decreased in size since September 2016. Continued stability of sub-centimeter left cervical lymph nodes. No new or progressive metastatic disease in the neck.   06/16/2016 Imaging   CT chest showed stable masslike radiation fibrosis in the left upper lobe. Stable subcentimeter pulmonary nodules in the bilateral lower lobes. No new or progressive metastatic disease in the chest. Nonobstructing left renal stone.   10/13/2016 Imaging   Ct neck showed unchanged right sternocleidomastoid muscle metastasis. Unchanged subcentimeter left cervical lymph nodes. No evidence of new or progressive metastatic disease in the neck.   10/13/2016 Imaging   CT chest showed tiny hypervascular foci in the liver, not definitely seen on prior imaging of 06/16/2016 and 02/28/2016. Abdomen MRI without and with contrast recommended to further evaluate as metastatic disease is a concern. 2. Stable appearance of post treatment changes left upper lung and scattered tiny bilateral pulmonary nodules.   02/11/2017 Imaging   Ct neck: Lymph node mass right posterior neck appears improved from the prior study. Small posterior lymph nodes on the left unchanged. Occluded right jugular vein unchanged.   02/11/2017 Imaging   1. Similar appearance of postsurgical and radiation  changes in the left upper lobe. 2. Similar bilateral pulmonary nodules. 3. No thoracic adenopathy. 4. Subtle foci of post-contrast enhancement within the liver are suboptimally characterized on this nondedicated study. Likely similar. These could either be re-evaluated at followup or more entirely characterized with abdominal MRI. 5. Left nephrolithiasis.   05/19/2017 Imaging   Matted lymph node mass right posterior neck appears larger in the recent CT. Accurate measurements difficult due to infiltrating tumor margins and infiltration of the muscle. Right jugular vein again appears occluded or resected. Small left posterior lymph nodes stable. Left upper lobe airspace density stable and similar to the prior CT   06/03/2017 PET scan   1. Hypermetabolic ill-defined right level IIb lymph node, about  1.3 cm in diameter with maximum SUV 9.5 (formerly 8.1). Appearance suspicious for residual/recurrent malignancy. No worrisome left-sided lesion. 2. Left suprahilar indistinct opacity demonstrates no worrisome hypermetabolic activity. The 5 mm left lower lobe pulmonary nodule is stable and not currently hypermetabolic although below sensitive PET-CT size thresholds. 3. Other imaging findings of potential clinical significance: Bilateral nonobstructive nephrolithiasis. Chronic bilateral maxillary sinusitis.   05/11/2018 PET scan   1. Continued chronic accentuated metabolic activity in the vicinity of right level IIB and the adjacent right sternocleidomastoid muscle, with ill definition of surrounding tissue planes. Maximum SUV is currently 8.1, formerly 9.5. Accentuated metabolic activity is been present in this vicinity back through 06/25/2015, and there was also some low-level activity in this vicinity on 06/07/2014. Some of this may be from scarring and local muscular activity although clearly a component of residual tumor is difficult to exclude given the focally high activity. 2. Other imaging findings of  potential clinical significance: Chronic bilateral maxillary sinusitis. Chronic scarring in the left upper lobe. Chronically stable 5 mm left lower lobe nodule is considered benign. Nonobstructive left nephrolithiasis.   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 COMMENT:No significant keratinization is identified. Other basaloid carcinomas are in the differential diagnosis. No cell block material is available for further testing.   09/12/2018 Procedure   He underwent fine Needle Aspiration   10/04/2018 PET scan   1. Significant progression of local recurrence laterally in the mid right neck with an enlarging, increasingly hypermetabolic soft tissue mass. This involves the right sternocleidomastoid muscle. 2. Small lymph nodes in the right axilla are increasingly hypermetabolic. These are nonspecific and potentially reactive, although could reflect a small metastases. Small hypermetabolic nodule in the left suprasternal notch is unchanged. 3. No other evidence of metastatic disease.     10/07/2018 - 12/23/2018 Chemotherapy   The patient had cisplatin plus gemzar   12/07/2018 Imaging   1. Decreased size of lateral right neck mass. 2. Unchanged soft tissue nodule in the suprasternal notch. 3. No evidence of new metastatic disease in the neck.     05/30/2019 Imaging   CT neck No clear change or progression compared to the study of March. Overall measurements of the right lateral neck mass are similar, approximately 3 x 1.8 cm. See above discussion. One could argue that there is slight increase in lateral bulging, possibly with an increase in contrast enhancement, towards the inferior margin. This is of questionable validity but could possibly represent some progression or inflammatory change. Other findings in the region are stable.   07/20/2019 -  09/15/2019 Chemotherapy   The patient had dexamethasone (DECADRON) 4 MG tablet, 1 of 1 cycle, Start date: --, End date: -- palonosetron (ALOXI) injection 0.25 mg, 0.25 mg, Intravenous,  Once, 3 of 4 cycles Administration: 0.25 mg (07/20/2019), 0.25 mg (08/10/2019), 0.25 mg (09/08/2019) CISplatin (PLATINOL) 84 mg in sodium chloride 0.9 % 250 mL chemo infusion, 40 mg/m2 = 84 mg (80 % of original dose 50 mg/m2), Intravenous,  Once, 3 of 4 cycles Dose modification: 40 mg/m2 (80 % of original dose 50 mg/m2, Cycle 1, Reason: Dose Not Tolerated) Administration: 84 mg (07/20/2019), 84 mg (08/10/2019), 83 mg (09/08/2019) gemcitabine (GEMZAR) 1,600 mg in sodium chloride 0.9 % 250 mL chemo infusion, 1,672 mg (80 % of original dose 1,000 mg/m2), Intravenous,  Once, 3 of 4 cycles Dose modification:  800 mg/m2 (80 % of original dose 1,000 mg/m2, Cycle 1, Reason: Provider Judgment) Administration: 1,600 mg (07/20/2019), 1,600 mg (07/27/2019), 1,600 mg (08/10/2019), 1,600 mg (08/17/2019), 1,600 mg (09/08/2019), 1,672 mg (09/15/2019) ondansetron (ZOFRAN) 8 mg, dexamethasone (DECADRON) 10 mg in sodium chloride 0.9 % 50 mL IVPB, , Intravenous,  Once, 3 of 4 cycles Administration:  (09/15/2019) fosaprepitant (EMEND) 150 mg, dexamethasone (DECADRON) 12 mg in sodium chloride 0.9 % 145 mL IVPB, , Intravenous,  Once, 3 of 4 cycles Administration:  (07/20/2019),  (08/10/2019),  (09/08/2019)  for chemotherapy treatment.    10/02/2019 Imaging   CT neck As compared to 05/30/2019, no significant interval change in size of an ill-defined mass within the right lateral neck, again measuring 3.3 x 1.8 cm in transaxial dimensions.   Unchanged mildly enlarged left level I lymph node measuring 1.1 cm in short axis.   Unchanged node or nodule at the thoracic inlet, measuring 1.3 x 0.8 cm.   Please refer to concurrently performed chest CT for a description of findings below the level of the thoracic inlet.     10/02/2019 Imaging   CT  chest 1. No new or progressive findings in the chest to suggest metastatic disease. 2. Bilateral subcentimeter solid pulmonary nodules are stable since 2018. 3. Hyperdense 1.1 cm anterior liver focus, not clearly visualized on prior studies. Suggest MRI abdomen without and with IV contrast for further characterization.   10/20/2019 - 12/29/2019 Chemotherapy   The patient had ondansetron (ZOFRAN) injection 8 mg, 8 mg (100 % of original dose 8 mg), Intravenous,  Once, 2 of 5 cycles Dose modification: 8 mg (original dose 8 mg, Cycle 2) Administration: 8 mg (11/17/2019), 8 mg (12/15/2019), 8 mg (12/29/2019) gemcitabine (GEMZAR) 2,000 mg in sodium chloride 0.9 % 250 mL chemo infusion, 2,090 mg, Intravenous,  Once, 3 of 6 cycles Administration: 2,000 mg (10/20/2019), 2,000 mg (11/03/2019), 2,000 mg (11/17/2019), 2,000 mg (12/15/2019), 2,000 mg (12/29/2019)  for chemotherapy treatment.    06/12/2020 Imaging   1. Enlarging superficial, exophytic component of the chronic right sternocleidomastoid muscle mass. See series 6, image 55. 2. Elsewhere stable CT appearance of the Neck.   06/12/2020 Imaging   Post treatment scarring in the left hemithorax, stable. No evidence recurrent or metastatic disease   06/14/2020 - 10/15/2020 Chemotherapy   He received carboplatin, 5FU and Beryle Flock       10/31/2020 Procedure   Interval improvement in right lateral lymph node mass. Improvement in dermal component as well as invasion of the right sternocleidomastoid muscle.   10 mm submental lymph node slightly enlarged compared to the prior study. Continued follow-up recommended.   11/01/2020 - 05/22/2022 Chemotherapy   Patient is on Treatment Plan : HEAD/NECK Pembrolizumab Q21D     07/03/2022 -  Chemotherapy   Patient is on Treatment Plan : Head and neck Pembrolizumab (400) q42d       PHYSICAL EXAMINATION: ECOG PERFORMANCE STATUS: 1 - Symptomatic but completely ambulatory  Vitals:   11/20/22 1352  BP: 123/85  Pulse:  95  Resp: 18  Temp: 97.8 F (36.6 C)  SpO2: 100%   Filed Weights   11/20/22 1352  Weight: 186 lb 6.4 oz (84.6 kg)    GENERAL:alert, no distress and comfortable SKIN: skin color, texture, turgor are normal, no rashes or significant lesions EYES: normal, Conjunctiva are pink and non-injected, sclera clear OROPHARYNX:no exudate, no erythema and lips, buccal mucosa, and tongue normal  NECK: He has a new mass on the right side of his  neck consistent with disease recurrence NEURO: alert & oriented x 3 with fluent speech, no focal motor/sensory deficits  LABORATORY DATA:  I have reviewed the data as listed    Component Value Date/Time   NA 140 10/30/2022 1214   NA 139 09/22/2017 0829   K 3.8 10/30/2022 1214   K 3.5 09/22/2017 0829   CL 105 10/30/2022 1214   CO2 31 10/30/2022 1214   CO2 26 09/22/2017 0829   GLUCOSE 94 10/30/2022 1214   GLUCOSE 133 09/22/2017 0829   BUN 15 10/30/2022 1214   BUN 14.1 09/22/2017 0829   CREATININE 0.82 10/30/2022 1214   CREATININE 0.9 09/22/2017 0829   CALCIUM 9.3 10/30/2022 1214   CALCIUM 9.1 09/22/2017 0829   PROT 7.0 10/30/2022 1214   PROT 6.8 09/22/2017 0829   ALBUMIN 4.0 10/30/2022 1214   ALBUMIN 4.1 09/22/2017 0829   AST 19 10/30/2022 1214   AST 22 09/22/2017 0829   ALT 17 10/30/2022 1214   ALT 30 09/22/2017 0829   ALKPHOS 69 10/30/2022 1214   ALKPHOS 55 09/22/2017 0829   BILITOT 0.2 (L) 10/30/2022 1214   BILITOT 0.35 09/22/2017 0829   GFRNONAA >60 10/30/2022 1214   GFRAA >60 07/05/2020 0845   GFRAA >60 02/06/2019 1215    No results found for: "SPEP", "UPEP"  Lab Results  Component Value Date   WBC 5.0 10/30/2022   NEUTROABS 2.7 10/30/2022   HGB 12.1 (L) 10/30/2022   HCT 36.2 (L) 10/30/2022   MCV 85.8 10/30/2022   PLT 268 10/30/2022      Chemistry      Component Value Date/Time   NA 140 10/30/2022 1214   NA 139 09/22/2017 0829   K 3.8 10/30/2022 1214   K 3.5 09/22/2017 0829   CL 105 10/30/2022 1214   CO2 31  10/30/2022 1214   CO2 26 09/22/2017 0829   BUN 15 10/30/2022 1214   BUN 14.1 09/22/2017 0829   CREATININE 0.82 10/30/2022 1214   CREATININE 0.9 09/22/2017 0829      Component Value Date/Time   CALCIUM 9.3 10/30/2022 1214   CALCIUM 9.1 09/22/2017 0829   ALKPHOS 69 10/30/2022 1214   ALKPHOS 55 09/22/2017 0829   AST 19 10/30/2022 1214   AST 22 09/22/2017 0829   ALT 17 10/30/2022 1214   ALT 30 09/22/2017 0829   BILITOT 0.2 (L) 10/30/2022 1214   BILITOT 0.35 09/22/2017 0829

## 2022-11-20 NOTE — Assessment & Plan Note (Signed)
Unfortunately, he has persistent enlarging mass on the right side of the neck, compatible with previous cancer recurrence I recommend CT imaging of the neck and chest for staging I will see him the following week to review test results and to discuss treatment options

## 2022-11-20 NOTE — Assessment & Plan Note (Signed)
He has poorly controlled cancer pain He has signs of disease recurrence He will continue methadone along with slightly increased dose of oxycodone as needed I refilled his prescription methadone today

## 2022-11-23 ENCOUNTER — Telehealth: Payer: Self-pay

## 2022-11-23 ENCOUNTER — Other Ambulatory Visit: Payer: Self-pay | Admitting: Hematology and Oncology

## 2022-11-23 NOTE — Telephone Encounter (Signed)
-----   Message from [name], MD sent at 11/23/2022 12:10 PM EST ----- He needs to call and schedule CT

## 2022-11-23 NOTE — Telephone Encounter (Signed)
Called and reminded him to schedule CT. They just called back and it is now.

## 2022-11-24 ENCOUNTER — Other Ambulatory Visit (HOSPITAL_BASED_OUTPATIENT_CLINIC_OR_DEPARTMENT_OTHER): Payer: Self-pay

## 2022-11-27 ENCOUNTER — Ambulatory Visit (HOSPITAL_BASED_OUTPATIENT_CLINIC_OR_DEPARTMENT_OTHER)
Admission: RE | Admit: 2022-11-27 | Discharge: 2022-11-27 | Disposition: A | Discharge status: Home / Self Care | Payer: Medicaid Other | Source: Ambulatory Visit | Attending: Hematology and Oncology | Admitting: Hematology and Oncology

## 2022-11-27 DIAGNOSIS — C760 Malignant neoplasm of head, face and neck: Secondary | ICD-10-CM | POA: Insufficient documentation

## 2022-11-27 DIAGNOSIS — C7800 Secondary malignant neoplasm of unspecified lung: Secondary | ICD-10-CM | POA: Diagnosis present

## 2022-11-27 MED ORDER — IOHEXOL 300 MG/ML  SOLN
100.0000 mL | Freq: Once | INTRAMUSCULAR | Status: AC | PRN
  Administered 2022-11-27: 75 mL via INTRAVENOUS

## 2022-11-30 ENCOUNTER — Other Ambulatory Visit (HOSPITAL_BASED_OUTPATIENT_CLINIC_OR_DEPARTMENT_OTHER): Payer: Self-pay

## 2022-11-30 ENCOUNTER — Encounter: Payer: Self-pay | Admitting: Hematology and Oncology

## 2022-12-01 ENCOUNTER — Inpatient Hospital Stay: Payer: Medicaid Other | Admitting: Hematology and Oncology

## 2022-12-01 ENCOUNTER — Telehealth: Payer: Self-pay | Admitting: Hematology and Oncology

## 2022-12-01 ENCOUNTER — Other Ambulatory Visit: Payer: Self-pay

## 2022-12-01 ENCOUNTER — Other Ambulatory Visit: Payer: Self-pay | Admitting: Hematology and Oncology

## 2022-12-01 ENCOUNTER — Other Ambulatory Visit (HOSPITAL_BASED_OUTPATIENT_CLINIC_OR_DEPARTMENT_OTHER): Payer: Self-pay

## 2022-12-01 ENCOUNTER — Encounter: Payer: Self-pay | Admitting: Hematology and Oncology

## 2022-12-01 ENCOUNTER — Telehealth: Payer: Self-pay

## 2022-12-01 VITALS — BP 141/89 | HR 86 | Temp 97.8°F | Resp 18 | Ht 69.0 in | Wt 188.4 lb

## 2022-12-01 DIAGNOSIS — E039 Hypothyroidism, unspecified: Secondary | ICD-10-CM

## 2022-12-01 DIAGNOSIS — C119 Malignant neoplasm of nasopharynx, unspecified: Secondary | ICD-10-CM | POA: Diagnosis not present

## 2022-12-01 DIAGNOSIS — C760 Malignant neoplasm of head, face and neck: Secondary | ICD-10-CM | POA: Diagnosis not present

## 2022-12-01 DIAGNOSIS — Z7189 Other specified counseling: Secondary | ICD-10-CM

## 2022-12-01 DIAGNOSIS — M542 Cervicalgia: Secondary | ICD-10-CM | POA: Diagnosis not present

## 2022-12-01 MED ORDER — LORAZEPAM 0.5 MG PO TABS
0.5000 mg | ORAL_TABLET | Freq: Two times a day (BID) | ORAL | 0 refills | Status: DC | PRN
  Filled 2022-12-01: qty 30, 15d supply, fill #0

## 2022-12-01 MED ORDER — OXYCODONE HCL 15 MG PO TABS
15.0000 mg | ORAL_TABLET | ORAL | 0 refills | Status: DC | PRN
  Filled 2022-12-01: qty 90, 15d supply, fill #0

## 2022-12-01 MED ORDER — ONDANSETRON HCL 8 MG PO TABS
8.0000 mg | ORAL_TABLET | Freq: Three times a day (TID) | ORAL | 1 refills | Status: DC | PRN
  Filled 2022-12-01: qty 90, 30d supply, fill #0
  Filled 2023-04-06: qty 90, 30d supply, fill #1

## 2022-12-01 MED ORDER — DEXAMETHASONE 4 MG PO TABS
4.0000 mg | ORAL_TABLET | Freq: Every day | ORAL | 0 refills | Status: DC
  Filled 2022-12-01: qty 30, 30d supply, fill #0

## 2022-12-01 NOTE — Progress Notes (Signed)
[facility] OFFICE PROGRESS NOTE  Patient Care Team: Patient, No Pcp Per as PCP - General (General Practice) [name], MD as Consulting Physician (Hematology and Oncology) Eppie Gibson, MD as Attending Physician (Radiation Oncology) Jodi Marble, MD as Consulting Physician (Otolaryngology) Philomena Doheny, MD as Referring Physician (Plastic Surgery) Irene Shipper, MD as Consulting Physician (Gastroenterology) System, Provider Not In  ASSESSMENT & PLAN:  Malignant neoplasm of head and neck [facility]) 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 The patient is not a surgical candidate and he had extensive radiation therapy in the past and is not a candidate for further radiation treatment MRI cervical spine is not going to be helpful  We discussed treatment option We reviewed the NCCN guidelines Previously, he had excellent response to therapy with cisplatin and gemcitabine We discussed recent FDA approval of combination chemotherapy with cisplatin, gemcitabine and toripalimab The risk, benefits, side effects were discussed with the patient and he is in agreement to proceed Due to previous poor tolerance to treatment, I will prescribe upfront dose reduction of cisplatin and gemcitabine and to space out his chemotherapy to every other week I will also modify his antiemetics so that on the day of gemcitabine he get at least Zofran I sent prescription for lorazepam for him to take as needed due to prior history of feeling jittery while on dexamethasone He will return next week to get lab work done and then chemotherapy at the end of next week I will see him prior to day 15 of treatment I recommend minimum 3 cycles of therapy before repeating CT imaging  Neck pain on right side He has worsening cancer associated pain due to disease recurrence I recommend increasing the  dose of oxycodone to 15 mg as needed  Orders Placed This Encounter  Procedures   CBC with Differential ([place] Only)    Standing Status:   Future    Standing Expiration Date:   12/12/2023   CMP ([place] only)    Standing Status:   Future    Standing Expiration Date:   12/12/2023   T4    Standing Status:   Future    Standing Expiration Date:   12/12/2023   TSH    Standing Status:   Future    Standing Expiration Date:   12/12/2023   Magnesium    Standing Status:   Future    Standing Expiration Date:   12/12/2023   CBC with Differential ([place] Only)    Standing Status:   Future    Standing Expiration Date:   12/26/2023   CMP ([place] only)    Standing Status:   Future    Standing Expiration Date:   12/26/2023   CBC with Differential ([place] Only)    Standing Status:   Future    Standing Expiration Date:   01/09/2024   CMP ([place] only)    Standing Status:   Future    Standing Expiration Date:   01/09/2024   Magnesium    Standing Status:   Future    Standing Expiration Date:   01/09/2024   CBC with Differential ([place] Only)    Standing Status:   Future    Standing Expiration Date:   01/23/2024   CMP ([place] only)    Standing Status:   Future    Standing Expiration Date:   01/23/2024  CBC with Differential ([facility] Only)    Standing Status:   Future    Standing Expiration Date:   02/06/2024   CMP ([place] only)    Standing Status:   Future    Standing Expiration Date:   02/06/2024   T4    Standing Status:   Future    Standing Expiration Date:   02/06/2024   TSH    Standing Status:   Future    Standing Expiration Date:   02/06/2024   Magnesium    Standing Status:   Future    Standing Expiration Date:   02/06/2024   CBC with Differential ([facility] Only)    Standing Status:   Future    Standing Expiration Date:   02/20/2024   CMP ([place] only)    Standing Status:   Future    Standing Expiration Date:   02/20/2024    CBC with Differential ([facility] Only)    Standing Status:   Future    Standing Expiration Date:   03/05/2024   CMP ([place] only)    Standing Status:   Future    Standing Expiration Date:   03/05/2024   Magnesium    Standing Status:   Future    Standing Expiration Date:   03/05/2024   CBC with Differential ([facility] Only)    Standing Status:   Future    Standing Expiration Date:   03/19/2024   CMP ([place] only)    Standing Status:   Future    Standing Expiration Date:   03/19/2024   CBC with Differential ([place] Only)    Standing Status:   Future    Standing Expiration Date:   04/02/2024   CMP ([place] only)    Standing Status:   Future    Standing Expiration Date:   04/02/2024   Magnesium    Standing Status:   Future    Standing Expiration Date:   04/02/2024   CBC with Differential ([facility] Only)    Standing Status:   Future    Standing Expiration Date:   04/16/2024   CMP ([place] only)    Standing Status:   Future    Standing Expiration Date:   04/16/2024   CBC with Differential ([place] Only)    Standing Status:   Future    Standing Expiration Date:   04/30/2024   CMP ([place] only)    Standing Status:   Future    Standing Expiration Date:   04/30/2024   T4    Standing Status:   Future    Standing Expiration Date:   04/30/2024   TSH    Standing Status:   Future    Standing Expiration Date:   04/30/2024   Magnesium    Standing Status:   Future    Standing Expiration Date:   04/30/2024   CBC with Differential ([place] Only)    Standing Status:   Future    Standing Expiration Date:   05/14/2024   CMP ([place] only)    Standing Status:   Future    Standing Expiration Date:   05/14/2024   CBC with Differential ([place] Only)    Standing Status:   Future    Standing Expiration Date:   05/28/2024   CMP ([place] only)    Standing Status:   Future    Standing Expiration Date:   05/28/2024   CBC with  Differential ([place] Only)  Standing Status:   Future    Standing Expiration Date:   06/18/2024   CMP ([place] only)    Standing Status:   Future    Standing Expiration Date:   06/18/2024   CBC with Differential ([place] Only)    Standing Status:   Future    Standing Expiration Date:   07/09/2024   CMP ([place] only)    Standing Status:   Future    Standing Expiration Date:   07/09/2024   T4    Standing Status:   Future    Standing Expiration Date:   07/09/2024   TSH    Standing Status:   Future    Standing Expiration Date:   07/09/2024   CBC with Differential ([place] Only)    Standing Status:   Future    Standing Expiration Date:   07/30/2024   CMP ([place] only)    Standing Status:   Future    Standing Expiration Date:   07/30/2024   CBC with Differential ([place] Only)    Standing Status:   Future    Standing Expiration Date:   08/20/2024   CMP ([place] only)    Standing Status:   Future    Standing Expiration Date:   08/20/2024   CBC with Differential ([facility] Only)    Standing Status:   Future    Standing Expiration Date:   09/10/2024   CMP ([place] only)    Standing Status:   Future    Standing Expiration Date:   09/10/2024   T4    Standing Status:   Future    Standing Expiration Date:   09/10/2024   TSH    Standing Status:   Future    Standing Expiration Date:   09/10/2024    All questions were answered. The patient knows to call the clinic with any problems, questions or concerns. The total time spent in the appointment was 40 minutes encounter with patients including review of chart and various tests results, discussions about plan of care and coordination of care plan   [name], MD 12/01/2022 1:59 PM  INTERVAL HISTORY: Please see below for problem oriented charting. he returns for treatment follow-up and review test results He has worsening pain control We spent majority of the time reviewing test  results and discussed plan of care  REVIEW OF SYSTEMS:   Constitutional: Denies fevers, chills or abnormal weight loss Eyes: Denies blurriness of vision Ears, nose, mouth, throat, and face: Denies mucositis or sore throat Respiratory: Denies cough, dyspnea or wheezes Cardiovascular: Denies palpitation, chest discomfort or lower extremity swelling Gastrointestinal:  Denies nausea, heartburn or change in bowel habits Skin: Denies abnormal skin rashes Lymphatics: Denies new lymphadenopathy or easy bruising Neurological:Denies numbness, tingling or new weaknesses Behavioral/Psych: Mood is stable, no new changes  All other systems were reviewed with the patient and are negative.  I have reviewed the past medical history, past surgical history, social history and family history with the patient and they are unchanged from previous note.  ALLERGIES:  is allergic to phenergan [promethazine hcl], heparin, and clindamycin.  MEDICATIONS:  Current Outpatient Medications  Medication Sig Dispense Refill   dexamethasone (DECADRON) 4 MG tablet Take 1 tablet (4 mg total) by mouth daily for 3 days after chemo, every 2 weeks. 30 tablet 0   LORazepam (ATIVAN) 0.5 MG tablet Take 1 tablet (0.5 mg total) by mouth 2 (two) times daily as needed for anxiety. 30 tablet  0   oxyCODONE (ROXICODONE) 15 MG immediate release tablet Take 1 tablet (15 mg total) by mouth every 4 (four) hours as needed for severe pain. 90 tablet 0   blood glucose meter kit and supplies KIT Dispense based on patient and insurance preference. Use up to four times daily as directed. (FOR ICD-9 250.00, 250.01). 1 each 1   cetirizine (ZYRTEC) 10 MG tablet Take 1 tablet (10 mg total) by mouth daily as needed for allergies. 100 tablet 0   levothyroxine (SYNTHROID) 125 MCG tablet Take 1 tablet (125 mcg total) by mouth daily before breakfast. 60 tablet 1   metFORMIN (GLUCOPHAGE) 500 MG tablet Take 1 tablet (500 mg total) by mouth 2 (two) times daily  with a meal. 60 tablet 2   methadone (DOLOPHINE) 10 MG tablet Take 1 tablet (10 mg total) by mouth every 12 (twelve) hours. 60 tablet 0   ondansetron (ZOFRAN) 8 MG tablet Take 1 tablet (8 mg total) by mouth every 8 (eight) hours as needed for nausea or vomiting. 90 tablet 1   polyethylene glycol (MIRALAX / GLYCOLAX) 17 g packet Take 17 g by mouth 2 (two) times daily.     senna-docusate (SENOKOT-S) 8.6-50 MG tablet Take 2 tablets by mouth 2 (two) times daily. 100 tablet 3   No current facility-administered medications for this visit.   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 Pan-endoscopy elsewhere showed cancer from nasopharyngeal space.   01/04/2007 - 02/20/2007 Chemotherapy   He received 2 cycles of cisplatin and 5FU followed by concurrent chemo with weekly cisplatin and radiation. He only received 2 doses of chemo due to severe mucositis, nausea and weight loss.   04/05/2007 - 08/04/2007 Chemotherapy   He received 4 more courses of cisplatin with 5FU and had complete response   07/05/2009 Procedure   Fine-needle aspirate of the right level II lymph nodes come  from recurrent metastatic disease. Repeat endoscopy and CT scan show no evidence of disease elsewhere.   07/08/2009 - 12/02/2009 Chemotherapy   He was given 6 cycles of carboplatin, 5-FU and docetaxel   12/03/2009 Surgery   He has surgery to the residual lymph node on the right neck which showed no evidence of disease.   02/22/2012 Imaging   Repeat imaging study showed large recurrent mass. He was referred elsewhere for further treatment.   05/03/2012 Surgery   He underwent left upper lobectomy.   04/29/2013 Imaging   PEt scan showed lesion on right level II B and lower lung was abnormal   06/03/2013 - 02/02/2014 Chemotherapy   He had 6 cycles of  chemotherapy when he was found to have recurrence of cancer and had received oxaliplatin and capecitabine   06/07/2014 Imaging   PET CT scan showed persistent disease in the right neck lymph nodes and left lung   06/29/2014 Procedure   Accession: [id number] repeat LUL biopsy confirmed metastatic cancer   07/18/2014 - 07/31/2014 Radiation Therapy   He received palliative radiation therapy to the lungs   10/10/2014 Imaging   CT scan of the chest, abdomen and pelvis show regression in the size of the lung nodule in the left upper lobe and stable pulmonary nodules   01/24/2015 Imaging   CT scan showed stable disease in neck and lung   06/19/2015 Imaging   CT scan of the neck and the chest show possible mild progression of the nodule in the right side of the neck.   06/25/2015 Imaging   PET scan confirmed disease recurrence in the neck   07/07/2015 Imaging   He had MRI neck [facility]   09/03/2015 - 08/26/2018 Chemotherapy   He received palliative chemo with Nivolumab   10/29/2015 Imaging   PET CT showed positive response to Rx   02/28/2016 Imaging   Ct abdomen showed abnormal thinkening in his stomach   03/03/2016 Imaging   CT: Right sternocleidomastoid muscle metastasis appears less distinct but otherwise not significantly changed in size or  configuration since 06/19/2015.2. Left level 3 lymph node which was hypermetabolic by PET-CT in January 2017 appears slightly smaller   04/01/2016 Imaging   CT cervical spine showed no acute fracture or traumatic malalignment in the cervical spine   04/22/2016 Procedure   Port-a-cath placed.   06/16/2016 Imaging   Ct neck showed right sternocleidomastoid muscle metastasis is further decreased in conspicuity since May, and has mildly decreased in size since September 2016. Continued stability of sub-centimeter left cervical lymph nodes. No new or progressive metastatic disease in the neck.   06/16/2016 Imaging   CT chest showed stable masslike radiation fibrosis in the left upper lobe. Stable subcentimeter pulmonary nodules in the bilateral lower lobes. No new or progressive metastatic disease in the chest. Nonobstructing left renal stone.   10/13/2016 Imaging   Ct neck showed unchanged right sternocleidomastoid muscle metastasis. Unchanged subcentimeter left cervical lymph nodes. No evidence of new or progressive metastatic disease in the neck.   10/13/2016 Imaging   CT chest showed tiny hypervascular foci in the liver, not definitely seen on prior imaging of 06/16/2016 and 02/28/2016. Abdomen MRI without and with contrast recommended to further evaluate as metastatic disease is a concern. 2. Stable appearance of post treatment changes left upper lung and scattered tiny bilateral pulmonary nodules.   02/11/2017 Imaging   Ct neck: Lymph node mass right posterior neck appears improved from the prior study. Small posterior lymph nodes on the left unchanged. Occluded right jugular vein unchanged.   02/11/2017 Imaging   1. Similar appearance of postsurgical and radiation changes in the left upper lobe. 2. Similar bilateral pulmonary nodules. 3. No thoracic adenopathy. 4. Subtle foci of post-contrast enhancement within the liver are suboptimally characterized on this nondedicated study. Likely similar.  These could either be re-evaluated at followup or more entirely characterized with abdominal MRI. 5. Left nephrolithiasis.   05/19/2017 Imaging   Matted lymph node mass right posterior neck appears larger in the recent CT. Accurate measurements difficult due to infiltrating tumor margins and infiltration of the muscle. Right jugular vein again appears occluded or resected. Small left posterior lymph nodes stable. Left upper lobe  airspace density stable and similar to the prior CT   06/03/2017 PET scan   1. Hypermetabolic ill-defined right level IIb lymph node, about 1.3 cm in diameter with maximum SUV 9.5 (formerly 8.1). Appearance suspicious for residual/recurrent malignancy. No worrisome left-sided lesion. 2. Left suprahilar indistinct opacity demonstrates no worrisome hypermetabolic activity. The 5 mm left lower lobe pulmonary nodule is stable and not currently hypermetabolic although below sensitive PET-CT size thresholds. 3. Other imaging findings of potential clinical significance: Bilateral nonobstructive nephrolithiasis. Chronic bilateral maxillary sinusitis.   05/11/2018 PET scan   1. Continued chronic accentuated metabolic activity in the vicinity of right level IIB and the adjacent right sternocleidomastoid muscle, with ill definition of surrounding tissue planes. Maximum SUV is currently 8.1, formerly 9.5. Accentuated metabolic activity is been present in this vicinity back through 06/25/2015, and there was also some low-level activity in this vicinity on 06/07/2014. Some of this may be from scarring and local muscular activity although clearly a component of residual tumor is difficult to exclude given the focally high activity. 2. Other imaging findings of potential clinical significance: Chronic bilateral maxillary sinusitis. Chronic scarring in the left upper lobe. Chronically stable 5 mm left lower lobe nodule is considered benign. Nonobstructive left nephrolithiasis.   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 COMMENT:No significant keratinization is identified. Other basaloid carcinomas are in the differential diagnosis. No cell block material is available for further testing.   09/12/2018 Procedure   He underwent fine Needle Aspiration   10/04/2018 PET scan   1. Significant progression of local recurrence laterally in the mid right neck with an enlarging, increasingly hypermetabolic soft tissue mass. This involves the right sternocleidomastoid muscle. 2. Small lymph nodes in the right axilla are increasingly hypermetabolic. These are nonspecific and potentially reactive, although could reflect a small metastases. Small hypermetabolic nodule in the left suprasternal notch is unchanged. 3. No other evidence of metastatic disease.     10/07/2018 - 12/23/2018 Chemotherapy   The patient had cisplatin plus gemzar   12/07/2018 Imaging   1. Decreased size of lateral right neck mass. 2. Unchanged soft tissue nodule in the suprasternal notch. 3. No evidence of new metastatic disease in the neck.     05/30/2019 Imaging   CT neck No clear change or progression compared to the study of March. Overall measurements of the right lateral neck mass are similar, approximately 3 x 1.8 cm. See above discussion. One could argue that there is slight increase in lateral bulging, possibly with an increase in contrast enhancement, towards the inferior margin. This is of questionable validity but could possibly represent some progression or inflammatory change. Other findings in the region are stable.   07/20/2019 - 09/15/2019 Chemotherapy   The patient had dexamethasone (DECADRON) 4 MG tablet, 1 of 1 cycle, Start date: --, End date: -- palonosetron (ALOXI) injection 0.25 mg, 0.25 mg, Intravenous,  Once, 3 of 4 cycles Administration: 0.25 mg  (07/20/2019), 0.25 mg (08/10/2019), 0.25 mg (09/08/2019) CISplatin (PLATINOL) 84 mg in sodium chloride 0.9 % 250 mL chemo infusion, 40 mg/m2 = 84 mg (80 % of original dose 50 mg/m2), Intravenous,  Once, 3 of 4 cycles Dose modification: 40 mg/m2 (80 % of original dose 50 mg/m2, Cycle 1, Reason: Dose Not Tolerated) Administration: 84 mg (07/20/2019), 84 mg (08/10/2019), 83 mg (09/08/2019) gemcitabine (GEMZAR) 1,600 mg in sodium  chloride 0.9 % 250 mL chemo infusion, 1,672 mg (80 % of original dose 1,000 mg/m2), Intravenous,  Once, 3 of 4 cycles Dose modification: 800 mg/m2 (80 % of original dose 1,000 mg/m2, Cycle 1, Reason: Provider Judgment) Administration: 1,600 mg (07/20/2019), 1,600 mg (07/27/2019), 1,600 mg (08/10/2019), 1,600 mg (08/17/2019), 1,600 mg (09/08/2019), 1,672 mg (09/15/2019) ondansetron (ZOFRAN) 8 mg, dexamethasone (DECADRON) 10 mg in sodium chloride 0.9 % 50 mL IVPB, , Intravenous,  Once, 3 of 4 cycles Administration:  (09/15/2019) fosaprepitant (EMEND) 150 mg, dexamethasone (DECADRON) 12 mg in sodium chloride 0.9 % 145 mL IVPB, , Intravenous,  Once, 3 of 4 cycles Administration:  (07/20/2019),  (08/10/2019),  (09/08/2019)  for chemotherapy treatment.    10/02/2019 Imaging   CT neck As compared to 05/30/2019, no significant interval change in size of an ill-defined mass within the right lateral neck, again measuring 3.3 x 1.8 cm in transaxial dimensions.   Unchanged mildly enlarged left level I lymph node measuring 1.1 cm in short axis.   Unchanged node or nodule at the thoracic inlet, measuring 1.3 x 0.8 cm.   Please refer to concurrently performed chest CT for a description of findings below the level of the thoracic inlet.     10/02/2019 Imaging   CT chest 1. No new or progressive findings in the chest to suggest metastatic disease. 2. Bilateral subcentimeter solid pulmonary nodules are stable since 2018. 3. Hyperdense 1.1 cm anterior liver focus, not clearly visualized on  prior studies. Suggest MRI abdomen without and with IV contrast for further characterization.   10/20/2019 - 12/29/2019 Chemotherapy   The patient had ondansetron (ZOFRAN) injection 8 mg, 8 mg (100 % of original dose 8 mg), Intravenous,  Once, 2 of 5 cycles Dose modification: 8 mg (original dose 8 mg, Cycle 2) Administration: 8 mg (11/17/2019), 8 mg (12/15/2019), 8 mg (12/29/2019) gemcitabine (GEMZAR) 2,000 mg in sodium chloride 0.9 % 250 mL chemo infusion, 2,090 mg, Intravenous,  Once, 3 of 6 cycles Administration: 2,000 mg (10/20/2019), 2,000 mg (11/03/2019), 2,000 mg (11/17/2019), 2,000 mg (12/15/2019), 2,000 mg (12/29/2019)  for chemotherapy treatment.    06/12/2020 Imaging   1. Enlarging superficial, exophytic component of the chronic right sternocleidomastoid muscle mass. See series 6, image 55. 2. Elsewhere stable CT appearance of the Neck.   06/12/2020 Imaging   Post treatment scarring in the left hemithorax, stable. No evidence recurrent or metastatic disease   06/14/2020 - 10/15/2020 Chemotherapy   He received carboplatin, 5FU and Beryle Flock       10/31/2020 Procedure   Interval improvement in right lateral lymph node mass. Improvement in dermal component as well as invasion of the right sternocleidomastoid muscle.   10 mm submental lymph node slightly enlarged compared to the prior study. Continued follow-up recommended.   11/01/2020 - 05/22/2022 Chemotherapy   Patient is on Treatment Plan : HEAD/NECK Pembrolizumab Q21D     07/03/2022 - 10/30/2022 Chemotherapy   Patient is on Treatment Plan : Head and neck Pembrolizumab (400) q42d     11/30/2022 Imaging   CT chest  1. Stable exam. No new or progressive findings to suggest recurrent or metastatic disease. 2. Stable tiny bilateral nodules since 06/12/2020, consistent with benign etiology. 3. Punctate nonobstructing left renal stone.   12/01/2022 Imaging   CT neck  Growing right neck mass since 2022 with epidural tumor extension via the  right C2-3 foramen. Cervical MRI with contrast would be contributory.   12/11/2022 -  Chemotherapy   Patient is on Treatment  Plan : HEAD/NECK toripalimab-tpzi D1 + cisplatin D1 + gemcitabine D1,8 q21d x 6 cycles / toripalimab-tpzi q21d (up to 91m       PHYSICAL EXAMINATION: ECOG PERFORMANCE STATUS: 1 - Symptomatic but completely ambulatory  Vitals:   12/01/22 1039  BP: (!) 141/89  Pulse: 86  Resp: 18  Temp: 97.8 F (36.6 C)  SpO2: 100%   Filed Weights   12/01/22 1039  Weight: 188 lb 6.4 oz (85.5 kg)    GENERAL:alert, no distress and comfortable  NEURO: alert & oriented x 3 with fluent speech, no focal motor/sensory deficits  LABORATORY DATA:  I have reviewed the data as listed    Component Value Date/Time   NA 140 10/30/2022 1214   NA 139 09/22/2017 0829   K 3.8 10/30/2022 1214   K 3.5 09/22/2017 0829   CL 105 10/30/2022 1214   CO2 31 10/30/2022 1214   CO2 26 09/22/2017 0829   GLUCOSE 94 10/30/2022 1214   GLUCOSE 133 09/22/2017 0829   BUN 15 10/30/2022 1214   BUN 14.1 09/22/2017 0829   CREATININE 0.82 10/30/2022 1214   CREATININE 0.9 09/22/2017 0829   CALCIUM 9.3 10/30/2022 1214   CALCIUM 9.1 09/22/2017 0829   PROT 7.0 10/30/2022 1214   PROT 6.8 09/22/2017 0829   ALBUMIN 4.0 10/30/2022 1214   ALBUMIN 4.1 09/22/2017 0829   AST 19 10/30/2022 1214   AST 22 09/22/2017 0829   ALT 17 10/30/2022 1214   ALT 30 09/22/2017 0829   ALKPHOS 69 10/30/2022 1214   ALKPHOS 55 09/22/2017 0829   BILITOT 0.2 (L) 10/30/2022 1214   BILITOT 0.35 09/22/2017 0829   GFRNONAA >60 10/30/2022 1214   GFRAA >60 07/05/2020 0845   GFRAA >60 02/06/2019 1215    No results found for: "SPEP", "UPEP"  Lab Results  Component Value Date   WBC 5.0 10/30/2022   NEUTROABS 2.7 10/30/2022   HGB 12.1 (L) 10/30/2022   HCT 36.2 (L) 10/30/2022   MCV 85.8 10/30/2022   PLT 268 10/30/2022      Chemistry      Component Value Date/Time   NA 140 10/30/2022 1214   NA 139 09/22/2017 0829   K  3.8 10/30/2022 1214   K 3.5 09/22/2017 0829   CL 105 10/30/2022 1214   CO2 31 10/30/2022 1214   CO2 26 09/22/2017 0829   BUN 15 10/30/2022 1214   BUN 14.1 09/22/2017 0829   CREATININE 0.82 10/30/2022 1214   CREATININE 0.9 09/22/2017 0829      Component Value Date/Time   CALCIUM 9.3 10/30/2022 1214   CALCIUM 9.1 09/22/2017 0829   ALKPHOS 69 10/30/2022 1214   ALKPHOS 55 09/22/2017 0829   AST 19 10/30/2022 1214   AST 22 09/22/2017 0829   ALT 17 10/30/2022 1214   ALT 30 09/22/2017 0829   BILITOT 0.2 (L) 10/30/2022 1214   BILITOT 0.35 09/22/2017 0829       RADIOGRAPHIC STUDIES: I have reviewed multiple imaging studies with the patient I have personally reviewed the radiological images as listed and agreed with the findings in the report. CT SOFT TISSUE NECK W CONTRAST  Result Date: 12/01/2022 CLINICAL DATA:  Head neck cancer staging.  Growing neck mass. EXAM: CT NECK WITH CONTRAST TECHNIQUE: Multidetector CT imaging of the neck was performed using the standard protocol following the bolus administration of intravenous contrast. RADIATION DOSE REDUCTION: This exam was performed according to the departmental dose-optimization program which includes automated exposure control, adjustment of the mA and/or  kV according to patient size and/or use of iterative reconstruction technique. CONTRAST:  41m OMNIPAQUE IOHEXOL 300 MG/ML  SOLN COMPARISON:  10/31/2020 FINDINGS: Pharynx and larynx: History of nasopharyngeal cancer. No primary mass or inflammation is seen. Salivary glands: Post treatment atrophy of salivary glands. There may have been resection of the right submandibular gland. Thyroid: Symmetric atrophy Lymph nodes: Known mass in the right lateral neck has enlarged in size and increased in enhancement, prominently involving the vertebral space with growth into the right C2-3 foramen with epidural tumor and right vertebral encasement. Ventral and right lateral epidural tumor deforms the thecal  sac without suspected cord compression. This mass extends to the skin surface if biopsy is needed. A nodule at the thoracic inlet is unchanged and non worrisome Vascular: Porta catheter on the right. Chronic right IJ occlusion or sacrifice. Limited intracranial: Negative Visualized orbits: Negative Mastoids and visualized paranasal sinuses: Clear Skeleton: Vertebral invasion as noted above. Upper chest: Post treatment left upper lobe where there is history of remote radiotherapy. No detected change. 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 Cervical MRI with contrast would be contributory. Electronically Signed   By: JJorje GuildM.D.   On: 12/01/2022 05:44   CT CHEST W CONTRAST  Result Date: 11/30/2022 CLINICAL DATA:  Head and neck cancer. Restaging. * Tracking Code: BO * EXAM: CT CHEST WITH CONTRAST TECHNIQUE: Multidetector CT imaging of the chest was performed during intravenous contrast administration. RADIATION DOSE REDUCTION: This exam was performed according to the departmental dose-optimization program which includes automated exposure control, adjustment of the mA and/or kV according to patient size and/or use of iterative reconstruction technique. CONTRAST:  748mOMNIPAQUE IOHEXOL 300 MG/ML  SOLN COMPARISON:  06/12/2020 FINDINGS: Cardiovascular: The heart size is normal. No substantial pericardial effusion. No thoracic aortic aneurysm. Right Port-A-Cath tip is positioned in the low right atrium Mediastinum/Nodes: No mediastinal lymphadenopathy. There is no hilar lymphadenopathy. The esophagus has normal imaging features. There is no axillary lymphadenopathy. Lungs/Pleura: Postsurgical change with scarring noted suprahilar left upper lobe. Similar appearance 5 mm left lower  lobe pulmonary nodule on 63/3. 4 mm right lower lobe nodule on 64/3 is unchanged. No new suspicious pulmonary nodule or mass. No focal airspace consolidation. There is no evidence of pleural effusion. Upper Abdomen: Punctate nonobstructing stone noted upper pole left kidney. Visualized upper abdomen otherwise unremarkable. Musculoskeletal: No worrisome lytic or sclerotic osseous abnormality. IMPRESSION: 1. Stable exam. No new or progressive findings to suggest recurrent or metastatic disease. 2. Stable tiny bilateral nodules since 06/12/2020, consistent with benign etiology. 3. Punctate nonobstructing left renal stone. Electronically Signed   By: ErMisty Stanley.D.   On: 11/30/2022 12:03

## 2022-12-01 NOTE — Telephone Encounter (Signed)
Notified Patient of prior authorization approval for Oxycodone HCL '15mg'$  Tablets. Medication is approved through 05/30/2023. No other needs or concerns voiced at this time.

## 2022-12-01 NOTE — Assessment & Plan Note (Signed)
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

## 2022-12-01 NOTE — Assessment & Plan Note (Signed)
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 The patient is not a surgical candidate and he had extensive radiation therapy in the past and is not a candidate for further radiation treatment MRI cervical spine is not going to be helpful  We discussed treatment option We reviewed the NCCN guidelines Previously, he had excellent response to therapy with cisplatin and gemcitabine We discussed recent FDA approval of combination chemotherapy with cisplatin, gemcitabine and toripalimab The risk, benefits, side effects were discussed with the patient and he is in agreement to proceed Due to previous poor tolerance to treatment, I will prescribe upfront dose reduction of cisplatin and gemcitabine and to space out his chemotherapy to every other week I will also modify his antiemetics so that on the day of gemcitabine he get at least Zofran I sent prescription for lorazepam for him to take as needed due to prior history of feeling jittery while on dexamethasone He will return next week to get lab work done and then chemotherapy at the end of next week I will see him prior to day 15 of treatment I recommend minimum 3 cycles of therapy before repeating CT imaging

## 2022-12-01 NOTE — Progress Notes (Signed)
DISCONTINUE OFF PATHWAY REGIMEN - Head and Neck   [id number]:Carboplatin IV D1 + Fluorouracil CIV D1-4 + Pembrolizumab IV D1 q21 Days (C1-6) Followed by Pembrolizumab IV D1 q21 Days:   A cycle is every 21 days:     Carboplatin      Fluorouracil      Pembrolizumab   **Always confirm dose/schedule in your pharmacy ordering system**  REASON: Disease Progression PRIOR TREATMENT: Off Pathway: Carboplatin IV D1 + Fluorouracil CIV D1-4 + Pembrolizumab IV D1 q21 Days (C1-6) Followed by Pembrolizumab IV D1 q21 Days TREATMENT RESPONSE: Progressive Disease (PD)  START OFF PATHWAY REGIMEN - Head and Neck   [id number]:Cisplatin 80 mg/m2 IV D1 + Gemcitabine 1,000 mg/m2 IV D1,8 + Toripalimab 240 mg IV D1 q21 Days x 6 Cycles Followed by Toripalimab 240 mg IV D1 q21 Days for up to 2 Years:   Cycles 1 through 6: A cycle is every 21 days:     Toripalimab-tpzi      Gemcitabine      Cisplatin    Cycles 7 and beyond: A cycle is every 21 days:     Toripalimab-tpzi   **Always confirm dose/schedule in your pharmacy ordering system**  Patient Characteristics: Other Sites, Metastatic, Fourth Line and Beyond Disease Classification: Other Sites AJCC 8 Stage Grouping: IVB Therapeutic Status: Metastatic Disease Line of Therapy: Fourth Line and Beyond  Intent of Therapy: Non-Curative / Palliative Intent, Discussed with Patient

## 2022-12-01 NOTE — Telephone Encounter (Signed)
Patient aware of upcoming appointments  

## 2022-12-02 ENCOUNTER — Other Ambulatory Visit (HOSPITAL_BASED_OUTPATIENT_CLINIC_OR_DEPARTMENT_OTHER): Payer: Self-pay

## 2022-12-02 ENCOUNTER — Other Ambulatory Visit: Payer: Self-pay

## 2022-12-04 ENCOUNTER — Other Ambulatory Visit (HOSPITAL_BASED_OUTPATIENT_CLINIC_OR_DEPARTMENT_OTHER): Payer: Self-pay

## 2022-12-04 NOTE — Progress Notes (Signed)
Pharmacist Chemotherapy Monitoring - Initial Assessment    Anticipated start date: 12/11/2022   The following has been reviewed per standard work regarding the patient's treatment regimen: The patient's diagnosis, treatment plan and drug doses, and organ/hematologic function Lab orders and baseline tests specific to treatment regimen  The treatment plan start date, drug sequencing, and pre-medications Prior authorization status  Patient's documented medication list, including drug-drug interaction screen and prescriptions for anti-emetics and supportive care specific to the treatment regimen The drug concentrations, fluid compatibility, administration routes, and timing of the medications to be used The patient's access for treatment and lifetime cumulative dose history, if applicable  The patient's medication allergies and previous infusion related reactions, if applicable   Changes made to treatment plan:  N/A  Follow up needed:  N/A  Christian Simmons, [place], 12/04/2022  3:30 PM

## 2022-12-07 ENCOUNTER — Inpatient Hospital Stay: Payer: Medicaid Other | Attending: Hematology and Oncology

## 2022-12-07 DIAGNOSIS — K1230 Oral mucositis (ulcerative), unspecified: Secondary | ICD-10-CM | POA: Insufficient documentation

## 2022-12-07 DIAGNOSIS — R070 Pain in throat: Secondary | ICD-10-CM | POA: Insufficient documentation

## 2022-12-07 DIAGNOSIS — Z5111 Encounter for antineoplastic chemotherapy: Secondary | ICD-10-CM | POA: Insufficient documentation

## 2022-12-07 DIAGNOSIS — Z79899 Other long term (current) drug therapy: Secondary | ICD-10-CM | POA: Insufficient documentation

## 2022-12-07 DIAGNOSIS — C119 Malignant neoplasm of nasopharynx, unspecified: Secondary | ICD-10-CM | POA: Insufficient documentation

## 2022-12-07 DIAGNOSIS — K1379 Other lesions of oral mucosa: Secondary | ICD-10-CM | POA: Insufficient documentation

## 2022-12-07 DIAGNOSIS — K007 Teething syndrome: Secondary | ICD-10-CM | POA: Insufficient documentation

## 2022-12-07 DIAGNOSIS — C770 Secondary and unspecified malignant neoplasm of lymph nodes of head, face and neck: Secondary | ICD-10-CM | POA: Insufficient documentation

## 2022-12-08 ENCOUNTER — Other Ambulatory Visit: Payer: Self-pay | Admitting: Hematology and Oncology

## 2022-12-10 ENCOUNTER — Other Ambulatory Visit: Payer: Self-pay | Admitting: Hematology and Oncology

## 2022-12-10 MED FILL — Dexamethasone Sodium Phosphate Inj 100 MG/10ML: INTRAMUSCULAR | Qty: 1 | Status: AC

## 2022-12-10 MED FILL — Fosaprepitant Dimeglumine For IV Infusion 150 MG (Base Eq): INTRAVENOUS | Qty: 5 | Status: AC

## 2022-12-11 ENCOUNTER — Other Ambulatory Visit: Payer: Medicaid Other

## 2022-12-11 ENCOUNTER — Ambulatory Visit: Payer: Medicaid Other | Admitting: Hematology and Oncology

## 2022-12-11 ENCOUNTER — Ambulatory Visit: Payer: Medicaid Other

## 2022-12-11 ENCOUNTER — Inpatient Hospital Stay: Payer: Medicaid Other

## 2022-12-11 ENCOUNTER — Other Ambulatory Visit: Payer: Self-pay

## 2022-12-11 VITALS — BP 145/85 | HR 88 | Temp 98.4°F | Resp 18 | Ht 69.0 in | Wt 196.4 lb

## 2022-12-11 DIAGNOSIS — Z7189 Other specified counseling: Secondary | ICD-10-CM

## 2022-12-11 DIAGNOSIS — C770 Secondary and unspecified malignant neoplasm of lymph nodes of head, face and neck: Secondary | ICD-10-CM | POA: Diagnosis not present

## 2022-12-11 DIAGNOSIS — C760 Malignant neoplasm of head, face and neck: Secondary | ICD-10-CM

## 2022-12-11 DIAGNOSIS — Z5111 Encounter for antineoplastic chemotherapy: Secondary | ICD-10-CM | POA: Diagnosis present

## 2022-12-11 DIAGNOSIS — R070 Pain in throat: Secondary | ICD-10-CM | POA: Diagnosis not present

## 2022-12-11 DIAGNOSIS — K1230 Oral mucositis (ulcerative), unspecified: Secondary | ICD-10-CM | POA: Diagnosis not present

## 2022-12-11 DIAGNOSIS — C119 Malignant neoplasm of nasopharynx, unspecified: Secondary | ICD-10-CM | POA: Diagnosis present

## 2022-12-11 DIAGNOSIS — K007 Teething syndrome: Secondary | ICD-10-CM | POA: Diagnosis not present

## 2022-12-11 DIAGNOSIS — Z79899 Other long term (current) drug therapy: Secondary | ICD-10-CM | POA: Diagnosis not present

## 2022-12-11 DIAGNOSIS — K1379 Other lesions of oral mucosa: Secondary | ICD-10-CM | POA: Diagnosis not present

## 2022-12-11 LAB — CMP (CANCER CENTER ONLY)
ALT: 18 U/L (ref 0–44)
AST: 19 U/L (ref 15–41)
Albumin: 4.4 g/dL (ref 3.5–5.0)
Alkaline Phosphatase: 62 U/L (ref 38–126)
Anion gap: 7 (ref 5–15)
BUN: 18 mg/dL (ref 6–20)
CO2: 31 mmol/L (ref 22–32)
Calcium: 9.3 mg/dL (ref 8.9–10.3)
Chloride: 99 mmol/L (ref 98–111)
Creatinine: 1.07 mg/dL (ref 0.61–1.24)
GFR, Estimated: >60 mL/min (ref >60)
Glucose, Bld: 160 mg/dL — ABNORMAL HIGH (ref 70–99)
Potassium: 3.3 mmol/L — ABNORMAL LOW (ref 3.5–5.1)
Sodium: 137 mmol/L (ref 135–145)
Total Bilirubin: 0.2 mg/dL — ABNORMAL LOW (ref 0.3–1.2)
Total Protein: 7.2 g/dL (ref 6.5–8.1)

## 2022-12-11 LAB — MAGNESIUM: Magnesium: 1.7 mg/dL (ref 1.7–2.4)

## 2022-12-11 LAB — CBC WITH DIFFERENTIAL (CANCER CENTER ONLY)
Abs Immature Granulocytes: 0.01 10*3/uL (ref 0.00–0.07)
Basophils Absolute: 0 10*3/uL (ref 0.0–0.1)
Basophils Relative: 1 %
Eosinophils Absolute: 0.2 10*3/uL (ref 0.0–0.5)
Eosinophils Relative: 3 %
HCT: 36.1 % — ABNORMAL LOW (ref 39.0–52.0)
Hemoglobin: 11.9 g/dL — ABNORMAL LOW (ref 13.0–17.0)
Immature Granulocytes: 0 %
Lymphocytes Relative: 29 %
Lymphs Abs: 1.6 10*3/uL (ref 0.7–4.0)
MCH: 28.4 pg (ref 26.0–34.0)
MCHC: 33 g/dL (ref 30.0–36.0)
MCV: 86.2 fL (ref 80.0–100.0)
Monocytes Absolute: 0.5 10*3/uL (ref 0.1–1.0)
Monocytes Relative: 8 %
Neutro Abs: 3.2 10*3/uL (ref 1.7–7.7)
Neutrophils Relative %: 59 %
Platelet Count: 249 10*3/uL (ref 150–400)
RBC: 4.19 MIL/uL — ABNORMAL LOW (ref 4.22–5.81)
RDW: 13.6 % (ref 11.5–15.5)
WBC Count: 5.4 10*3/uL (ref 4.0–10.5)
nRBC: 0 % (ref 0.0–0.2)

## 2022-12-11 LAB — TSH: TSH: 6.086 u[IU]/mL — ABNORMAL HIGH (ref 0.350–4.500)

## 2022-12-11 MED ORDER — SODIUM CHLORIDE 0.9 % IV SOLN
750.0000 mg/m2 | Freq: Once | INTRAVENOUS | Status: AC
  Administered 2022-12-11: 1520 mg via INTRAVENOUS
  Filled 2022-12-11: qty 39.98

## 2022-12-11 MED ORDER — PALONOSETRON HCL INJECTION 0.25 MG/5ML
0.2500 mg | Freq: Once | INTRAVENOUS | Status: AC
  Administered 2022-12-11: 0.25 mg via INTRAVENOUS
  Filled 2022-12-11: qty 5

## 2022-12-11 MED ORDER — SODIUM CHLORIDE 0.9 % IV SOLN
150.0000 mg | Freq: Once | INTRAVENOUS | Status: AC
  Administered 2022-12-11: 150 mg via INTRAVENOUS
  Filled 2022-12-11: qty 150

## 2022-12-11 MED ORDER — SODIUM CHLORIDE 0.9 % IV SOLN: Freq: Once | INTRAVENOUS | Status: AC

## 2022-12-11 MED ORDER — TORIPALIMAB-TPZI CHEMO INJECTION 240 MG/6ML
240.0000 mg | Freq: Once | INTRAVENOUS | Status: AC
  Administered 2022-12-11: 240 mg via INTRAVENOUS
  Filled 2022-12-11: qty 6

## 2022-12-11 MED ORDER — SODIUM CHLORIDE 0.9 % IV SOLN
10.0000 mg | Freq: Once | INTRAVENOUS | Status: AC
  Administered 2022-12-11: 10 mg via INTRAVENOUS
  Filled 2022-12-11: qty 10

## 2022-12-11 MED ORDER — SODIUM CHLORIDE 0.9 % IV SOLN
60.0000 mg/m2 | Freq: Once | INTRAVENOUS | Status: AC
  Administered 2022-12-11: 122 mg via INTRAVENOUS
  Filled 2022-12-11: qty 122

## 2022-12-11 MED ORDER — MAGNESIUM SULFATE 2 GM/50ML IV SOLN
2.0000 g | Freq: Once | INTRAVENOUS | Status: AC
  Administered 2022-12-11: 2 g via INTRAVENOUS
  Filled 2022-12-11: qty 50

## 2022-12-11 MED ORDER — SODIUM CHLORIDE 0.9% FLUSH
10.0000 mL | Freq: Once | INTRAVENOUS | Status: AC
  Administered 2022-12-11: 10 mL via INTRAVENOUS

## 2022-12-11 MED ORDER — POTASSIUM CHLORIDE IN NACL 20-0.9 MEQ/L-% IV SOLN
Freq: Once | INTRAVENOUS | Status: AC
  Filled 2022-12-11: qty 1000

## 2022-12-11 NOTE — Patient Instructions (Addendum)
Christian Simmons  Discharge Instructions: Thank you for choosing [facility] to provide your oncology and hematology care.   If you have a lab appointment with the [facility], please go directly to the [facility] and check in at the registration area.   Wear comfortable clothing and clothing appropriate for easy access to any Portacath or PICC line.   We strive to give you quality time with your provider. You may need to reschedule your appointment if you arrive late (15 or more minutes).  Arriving late affects you and other patients whose appointments are after yours.  Also, if you miss three or more appointments without notifying the office, you may be dismissed from the clinic at the provider's discretion.      For prescription refill requests, have your pharmacy contact our office and allow 72 hours for refills to be completed.    Today you received the following chemotherapy and/or immunotherapy agents Cisplatin, Gemcitabine, Loqtorzi      To help prevent nausea and vomiting after your treatment, we encourage you to take your nausea medication as directed.  BELOW ARE SYMPTOMS THAT SHOULD BE REPORTED IMMEDIATELY: *FEVER GREATER THAN 100.4 F (38 C) OR HIGHER *CHILLS OR SWEATING *NAUSEA AND VOMITING THAT IS NOT CONTROLLED WITH YOUR NAUSEA MEDICATION *UNUSUAL SHORTNESS OF BREATH *UNUSUAL BRUISING OR BLEEDING *URINARY PROBLEMS (pain or burning when urinating, or frequent urination) *BOWEL PROBLEMS (unusual diarrhea, constipation, pain near the anus) TENDERNESS IN MOUTH AND THROAT WITH OR WITHOUT PRESENCE OF ULCERS (sore throat, sores in mouth, or a toothache) UNUSUAL RASH, SWELLING OR PAIN  UNUSUAL VAGINAL DISCHARGE OR ITCHING   Items with * indicate a potential emergency and should be followed up as soon as possible or go to the Emergency Department if any problems should occur.  Please show the CHEMOTHERAPY ALERT CARD or  IMMUNOTHERAPY ALERT CARD at check-in to the Emergency Department and triage nurse.  Should you have questions after your visit or need to cancel or reschedule your appointment, please contact [facility]  Dept: [phone]  and follow the prompts.  Office hours are 8:00 a.m. to 4:30 p.m. Monday - Friday. Please note that voicemails left after 4:00 p.m. may not be returned until the following business day.  We are closed weekends and major holidays. You have access to a nurse at all times for urgent questions. Please call the main number to the clinic Dept: [phone] and follow the prompts.   For any non-urgent questions, you may also contact your provider using MyChart. We now offer e-Visits for anyone 65 and older to request care online for non-urgent symptoms. For details visit [email or web address].   Also download the MyChart app! Go to the app store, search "MyChart", open the app, select [facility], and log in with your MyChart username and password.  Toripalimab Injection What is this medication? TORIPALIMAB (TOR ip A li mab) treats throat cancer. It works by helping your immune system slow or stop the spread of cancer cells. It is a monoclonal antibody. This medicine may be used for other purposes; ask your health care provider or pharmacist if you have questions. COMMON BRAND NAME(S): LOQTORZI What should I tell my care team before I take this medication? They need to know if you have any of these conditions: Autoimmune conditions, such as Crohn disease, ulcerative colitis, lupus Have received or plan to receive a stem cell transplant that uses donor stem  cells (allogeneic) History of radiation therapy to your chest area Nervous system conditions, such as Guillain-Barre syndrome, myasthenia gravis Organ transplant An unusual or allergic reaction to toripalimab, other medications, foods, dyes, or preservatives Pregnant or trying to get  pregnant Breastfeeding How should I use this medication? This medication is infused into a vein. It is given by your care team in a hospital or clinic setting. A special MedGuide will be given to you before each treatment. Be sure to read this information carefully each time. Talk to your care team about the use of this medication in children. Special care may be needed. Overdosage: If you think you have taken too much of this medicine contact a poison control center or emergency room at once. NOTE: This medicine is only for you. Do not share this medicine with others. What if I miss a dose? Keep appointments for follow-up doses. It is important not to miss your dose. Call your care team if you are unable to keep an appointment. What may interact with this medication? Interactions have not been studied. This list may not describe all possible interactions. Give your health care provider a list of all the medicines, herbs, non-prescription drugs, or dietary supplements you use. Also tell them if you smoke, drink alcohol, or use illegal drugs. Some items may interact with your medicine. What should I watch for while using this medication? Your condition will be monitored carefully while you are receiving this medication. You may need blood work while taking this medication. This medication may cause serious skin reactions. They can happen weeks to months after starting the medication. Contact your care team right away if you notice fevers or flu-like symptoms with a rash. The rash may be red or purple and then turn into blisters or peeling of the skin. You may also notice a red rash with swelling of the face, lips, or lymph nodes in your neck or under your arms. Talk to your care team if you may be pregnant. Serious birth defects can occur if you take this medication during pregnancy and for 4 months after the last dose. You will need a negative pregnancy test before starting this medication.  Contraception is recommended while taking this medication and for 4 months after the last dose. Your care team can help you find the option that works for you. Do not breastfeed while taking this medication and for 4 months after the last dose. What side effects may I notice from receiving this medication? Side effects that you should report to your care team as soon as possible: Allergic reactions--skin rash, itching, hives, swelling of the face, lips, tongue, or throat Dry cough, shortness of breath or trouble breathing Eye pain, redness, irritation, or discharge with blurry or decreased vision Heart muscle inflammation--unusual weakness or fatigue, shortness of breath, chest pain, fast or irregular heartbeat, dizziness, swelling of the ankles, feet, or hands Hormone gland problems--headache, sensitivity to light, unusual weakness or fatigue, dizziness, fast or irregular heartbeat, increased sensitivity to cold or heat, excessive sweating, constipation, hair loss, increased thirst or amount of urine, tremors or shaking, irritability Infusion reactions--chest pain, shortness of breath or trouble breathing, feeling faint or lightheaded Kidney injury (glomerulonephritis)--decrease in the amount of urine, red or dark brown urine, foamy or bubbly urine, swelling of the ankles, hands, or feet Liver injury--right upper belly pain, loss of appetite, nausea, light-colored stool, dark yellow or brown urine, yellowing skin or eyes, unusual weakness or fatigue Pain, tingling, or numbness in the  hands or feet, muscle weakness, change in vision, confusion or trouble speaking, loss of balance or coordination, trouble walking, seizures Rash, fever, and swollen lymph nodes Redness, blistering, peeling, or loosening of the skin, including inside the mouth Sudden or severe stomach pain, bloody diarrhea, fever, nausea, vomiting Side effects that usually do not require medical attention (report these to your care team  if they continue or are bothersome): Constipation Fatigue Loss of appetite Muscle pain or cramps Nausea Skin rash Vomiting This list may not describe all possible side effects. Call your doctor for medical advice about side effects. You may report side effects to FDA at 1-800-FDA-1088. Where should I keep my medication? This medication is given in a hospital or clinic. It will not be stored at home. NOTE: This sheet is a summary. It may not cover all possible information. If you have questions about this medicine, talk to your doctor, pharmacist, or health care provider.  2023 Elsevier/Gold Standard (2022-08-07 00:00:00) Cisplatin Injection What is this medication? CISPLATIN (SIS pla tin) treats some types of cancer. It works by slowing down the growth of cancer cells. This medicine may be used for other purposes; ask your health care provider or pharmacist if you have questions. COMMON BRAND NAME(S): Platinol, Platinol -AQ What should I tell my care team before I take this medication? They need to know if you have any of these conditions: Eye disease, vision problems Hearing problems Kidney disease Low blood counts, such as low white cells, platelets, or red blood cells Tingling of the fingers or toes, or other nerve disorder An unusual or allergic reaction to cisplatin, carboplatin, oxaliplatin, other medications, foods, dyes, or preservatives If you or your partner are pregnant or trying to get pregnant Breast-feeding How should I use this medication? This medication is injected into a vein. It is given by your care team in a hospital or clinic setting. Talk to your care team about the use of this medication in children. Special care may be needed. Overdosage: If you think you have taken too much of this medicine contact a poison control center or emergency room at once. NOTE: This medicine is only for you. Do not share this medicine with others. What if I miss a dose? Keep  appointments for follow-up doses. It is important not to miss your dose. Call your care team if you are unable to keep an appointment. What may interact with this medication? Do not take this medication with any of the following: Live virus vaccines This medication may also interact with the following: Certain antibiotics, such as amikacin, gentamicin, neomycin, polymyxin B, streptomycin, tobramycin, vancomycin Foscarnet This list may not describe all possible interactions. Give your health care provider a list of all the medicines, herbs, non-prescription drugs, or dietary supplements you use. Also tell them if you smoke, drink alcohol, or use illegal drugs. Some items may interact with your medicine. What should I watch for while using this medication? Your condition will be monitored carefully while you are receiving this medication. You may need blood work done while taking this medication. This medication may make you feel generally unwell. This is not uncommon, as chemotherapy can affect healthy cells as well as cancer cells. Report any side effects. Continue your course of treatment even though you feel ill unless your care team tells you to stop. This medication may increase your risk of getting an infection. Call your care team for advice if you get a fever, chills, sore throat, or other symptoms  of a cold or flu. Do not treat yourself. Try to avoid being around people who are sick. Avoid taking medications that contain aspirin, acetaminophen, ibuprofen, naproxen, or ketoprofen unless instructed by your care team. These medications may hide a fever. This medication may increase your risk to bruise or bleed. Call your care team if you notice any unusual bleeding. Be careful brushing or flossing your teeth or using a toothpick because you may get an infection or bleed more easily. If you have any dental work done, tell your dentist you are receiving this medication. Drink fluids as directed  while you are taking this medication. This will help protect your kidneys. Call your care team if you get diarrhea. Do not treat yourself. Talk to your care team if you or your partner wish to become pregnant or think you might be pregnant. This medication can cause serious birth defects if taken during pregnancy and for 14 months after the last dose. A negative pregnancy test is required before starting this medication. A reliable form of contraception is recommended while taking this medication and for 14 months after the last dose. Talk to your care team about effective forms of contraception. Do not father a child while taking this medication and for 11 months after the last dose. Use a condom during sex during this time period. Do not breast-feed while taking this medication. This medication may cause infertility. Talk to your care team if you are concerned about your fertility. What side effects may I notice from receiving this medication? Side effects that you should report to your care team as soon as possible: Allergic reactions--skin rash, itching, hives, swelling of the face, lips, tongue, or throat Eye pain, change in vision, vision loss Hearing loss, ringing in ears Infection--fever, chills, cough, sore throat, wounds that don't heal, pain or trouble when passing urine, general feeling of discomfort or being unwell Kidney injury--decrease in the amount of urine, swelling of the ankles, hands, or feet Low red blood cell level--unusual weakness or fatigue, dizziness, headache, trouble breathing Painful swelling, warmth, or redness of the skin, blisters or sores at the infusion site Pain, tingling, or numbness in the hands or feet Unusual bruising or bleeding Side effects that usually do not require medical attention (report to your care team if they continue or are bothersome): Hair loss Nausea Vomiting This list may not describe all possible side effects. Call your doctor for medical  advice about side effects. You may report side effects to FDA at 1-800-FDA-1088. Where should I keep my medication? This medication is given in a hospital or clinic. It will not be stored at home. NOTE: This sheet is a summary. It may not cover all possible information. If you have questions about this medicine, talk to your doctor, pharmacist, or health care provider.  2023 Elsevier/Gold Standard (2022-01-16 00:00:00) Gemcitabine Injection What is this medication? GEMCITABINE (jem SYE ta been) treats some types of cancer. It works by slowing down the growth of cancer cells. This medicine may be used for other purposes; ask your health care provider or pharmacist if you have questions. COMMON BRAND NAME(S): Gemzar, Infugem What should I tell my care team before I take this medication? They need to know if you have any of these conditions: Blood disorders Infection Kidney disease Liver disease Lung or breathing disease, such as asthma or COPD Recent or ongoing radiation therapy An unusual or allergic reaction to gemcitabine, other medications, foods, dyes, or preservatives If you or your partner  are pregnant or trying to get pregnant Breast-feeding How should I use this medication? This medication is injected into a vein. It is given by your care team in a hospital or clinic setting. Talk to your care team about the use of this medication in children. Special care may be needed. Overdosage: If you think you have taken too much of this medicine contact a poison control center or emergency room at once. NOTE: This medicine is only for you. Do not share this medicine with others. What if I miss a dose? Keep appointments for follow-up doses. It is important not to miss your dose. Call your care team if you are unable to keep an appointment. What may interact with this medication? Interactions have not been studied. This list may not describe all possible interactions. Give your health care  provider a list of all the medicines, herbs, non-prescription drugs, or dietary supplements you use. Also tell them if you smoke, drink alcohol, or use illegal drugs. Some items may interact with your medicine. What should I watch for while using this medication? Your condition will be monitored carefully while you are receiving this medication. This medication may make you feel generally unwell. This is not uncommon, as chemotherapy can affect healthy cells as well as cancer cells. Report any side effects. Continue your course of treatment even though you feel ill unless your care team tells you to stop. In some cases, you may be given additional medications to help with side effects. Follow all directions for their use. This medication may increase your risk of getting an infection. Call your care team for advice if you get a fever, chills, sore throat, or other symptoms of a cold or flu. Do not treat yourself. Try to avoid being around people who are sick. This medication may increase your risk to bruise or bleed. Call your care team if you notice any unusual bleeding. Be careful brushing or flossing your teeth or using a toothpick because you may get an infection or bleed more easily. If you have any dental work done, tell your dentist you are receiving this medication. Avoid taking medications that contain aspirin, acetaminophen, ibuprofen, naproxen, or ketoprofen unless instructed by your care team. These medications may hide a fever. Talk to your care team if you or your partner wish to become pregnant or think you might be pregnant. This medication can cause serious birth defects if taken during pregnancy and for 6 months after the last dose. A negative pregnancy test is required before starting this medication. A reliable form of contraception is recommended while taking this medication and for 6 months after the last dose. Talk to your care team about effective forms of contraception. Do not  father a child while taking this medication and for 3 months after the last dose. Use a condom while having sex during this time period. Do not breastfeed while taking this medication and for at least 1 week after the last dose. This medication may cause infertility. Talk to your care team if you are concerned about your fertility. What side effects may I notice from receiving this medication? Side effects that you should report to your care team as soon as possible: Allergic reactions--skin rash, itching, hives, swelling of the face, lips, tongue, or throat Capillary leak syndrome--stomach or muscle pain, unusual weakness or fatigue, feeling faint or lightheaded, decrease in the amount of urine, swelling of the ankles, hands, or feet, trouble breathing Infection--fever, chills, cough, sore throat, wounds that  don't heal, pain or trouble when passing urine, general feeling of discomfort or being unwell Liver injury--right upper belly pain, loss of appetite, nausea, light-colored stool, dark yellow or brown urine, yellowing skin or eyes, unusual weakness or fatigue Low red blood cell level--unusual weakness or fatigue, dizziness, headache, trouble breathing Lung injury--shortness of breath or trouble breathing, cough, spitting up blood, chest pain, fever Stomach pain, bloody diarrhea, pale skin, unusual weakness or fatigue, decrease in the amount of urine, which may be signs of hemolytic uremic syndrome Sudden and severe headache, confusion, change in vision, seizures, which may be signs of posterior reversible encephalopathy syndrome (PRES) Unusual bruising or bleeding Side effects that usually do not require medical attention (report to your care team if they continue or are bothersome): Diarrhea Drowsiness Hair loss Nausea Pain, redness, or swelling with sores inside the mouth or throat Vomiting This list may not describe all possible side effects. Call your doctor for medical advice about  side effects. You may report side effects to FDA at 1-800-FDA-1088. Where should I keep my medication? This medication is given in a hospital or clinic. It will not be stored at home. NOTE: This sheet is a summary. It may not cover all possible information. If you have questions about this medicine, talk to your doctor, pharmacist, or health care provider.  2023 Elsevier/Gold Standard (2022-01-27 00:00:00)

## 2022-12-13 LAB — T4: T4, Total: 5.2 ug/dL (ref 4.5–12.0)

## 2022-12-14 ENCOUNTER — Telehealth: Payer: Self-pay

## 2022-12-14 ENCOUNTER — Other Ambulatory Visit: Payer: Self-pay

## 2022-12-14 ENCOUNTER — Other Ambulatory Visit (HOSPITAL_BASED_OUTPATIENT_CLINIC_OR_DEPARTMENT_OTHER): Payer: Self-pay

## 2022-12-14 MED ORDER — LEVOTHYROXINE SODIUM 150 MCG PO TABS
150.0000 ug | ORAL_TABLET | Freq: Every day | ORAL | 2 refills | Status: DC
  Filled 2022-12-14 – 2023-01-28 (×2): qty 30, 30d supply, fill #0
  Filled 2023-01-28: qty 30, 30d supply, fill #1

## 2022-12-14 NOTE — Telephone Encounter (Signed)
-----   Message from [name], MD sent at 12/14/2022  9:05 AM EDT ----- His TSH is high Increase synthroid to 150 mcg Ask if he has some at home, if not call in to his pharmacy

## 2022-12-14 NOTE — Telephone Encounter (Signed)
Called and given below message. He verbalized understanding and requested Rx to pharmacy. Rx sent. Follow up to see how he is doing today after treatment on Friday. He is tired. Offered IV fluid appt. He declined appt and will call the office back if appt need for IV fluids/ or for questions/ concerns.

## 2022-12-18 ENCOUNTER — Other Ambulatory Visit: Payer: Self-pay | Admitting: Hematology and Oncology

## 2022-12-18 ENCOUNTER — Telehealth: Payer: Self-pay

## 2022-12-18 ENCOUNTER — Other Ambulatory Visit (HOSPITAL_BASED_OUTPATIENT_CLINIC_OR_DEPARTMENT_OTHER): Payer: Self-pay

## 2022-12-18 MED ORDER — OXYCODONE HCL 15 MG PO TABS
15.0000 mg | ORAL_TABLET | ORAL | 0 refills | Status: DC | PRN
  Filled 2022-12-18: qty 90, 15d supply, fill #0

## 2022-12-18 NOTE — Telephone Encounter (Signed)
Done

## 2022-12-18 NOTE — Telephone Encounter (Signed)
He called and left a message requesting Oxycodone refill to pharmacy. He is feeling better today.

## 2022-12-18 NOTE — Telephone Encounter (Signed)
Called and left a message Rx sent. Ask him to call the office back for questions. °

## 2022-12-25 ENCOUNTER — Inpatient Hospital Stay: Payer: Medicaid Other | Admitting: Hematology and Oncology

## 2022-12-25 ENCOUNTER — Encounter: Payer: Self-pay | Admitting: Hematology and Oncology

## 2022-12-25 ENCOUNTER — Inpatient Hospital Stay: Payer: Medicaid Other

## 2022-12-25 ENCOUNTER — Other Ambulatory Visit: Payer: Self-pay

## 2022-12-25 ENCOUNTER — Other Ambulatory Visit: Payer: Self-pay | Admitting: Hematology and Oncology

## 2022-12-25 VITALS — BP 142/88 | HR 84 | Temp 98.4°F

## 2022-12-25 VITALS — BP 141/85 | HR 93 | Temp 98.2°F | Resp 18 | Ht 69.0 in | Wt 187.0 lb

## 2022-12-25 DIAGNOSIS — Z5111 Encounter for antineoplastic chemotherapy: Secondary | ICD-10-CM | POA: Diagnosis not present

## 2022-12-25 DIAGNOSIS — C760 Malignant neoplasm of head, face and neck: Secondary | ICD-10-CM

## 2022-12-25 DIAGNOSIS — Z7189 Other specified counseling: Secondary | ICD-10-CM

## 2022-12-25 DIAGNOSIS — G893 Neoplasm related pain (acute) (chronic): Secondary | ICD-10-CM | POA: Diagnosis not present

## 2022-12-25 DIAGNOSIS — Z95828 Presence of other vascular implants and grafts: Secondary | ICD-10-CM

## 2022-12-25 DIAGNOSIS — C7800 Secondary malignant neoplasm of unspecified lung: Secondary | ICD-10-CM

## 2022-12-25 LAB — CBC WITH DIFFERENTIAL (CANCER CENTER ONLY)
Abs Immature Granulocytes: 0.02 10*3/uL (ref 0.00–0.07)
Basophils Absolute: 0 10*3/uL (ref 0.0–0.1)
Basophils Relative: 1 %
Eosinophils Absolute: 0.2 10*3/uL (ref 0.0–0.5)
Eosinophils Relative: 4 %
HCT: 35.4 % — ABNORMAL LOW (ref 39.0–52.0)
Hemoglobin: 11.5 g/dL — ABNORMAL LOW (ref 13.0–17.0)
Immature Granulocytes: 1 %
Lymphocytes Relative: 35 %
Lymphs Abs: 1.4 10*3/uL (ref 0.7–4.0)
MCH: 28 pg (ref 26.0–34.0)
MCHC: 32.5 g/dL (ref 30.0–36.0)
MCV: 86.3 fL (ref 80.0–100.0)
Monocytes Absolute: 0.4 10*3/uL (ref 0.1–1.0)
Monocytes Relative: 10 %
Neutro Abs: 2 10*3/uL (ref 1.7–7.7)
Neutrophils Relative %: 49 %
Platelet Count: 266 10*3/uL (ref 150–400)
RBC: 4.1 MIL/uL — ABNORMAL LOW (ref 4.22–5.81)
RDW: 14 % (ref 11.5–15.5)
WBC Count: 4 10*3/uL (ref 4.0–10.5)
nRBC: 0 % (ref 0.0–0.2)

## 2022-12-25 LAB — CMP (CANCER CENTER ONLY)
ALT: 29 U/L (ref 0–44)
AST: 24 U/L (ref 15–41)
Albumin: 4.1 g/dL (ref 3.5–5.0)
Alkaline Phosphatase: 71 U/L (ref 38–126)
Anion gap: 9 (ref 5–15)
BUN: 16 mg/dL (ref 6–20)
CO2: 30 mmol/L (ref 22–32)
Calcium: 9.2 mg/dL (ref 8.9–10.3)
Chloride: 101 mmol/L (ref 98–111)
Creatinine: 1 mg/dL (ref 0.61–1.24)
GFR, Estimated: >60 mL/min (ref >60)
Glucose, Bld: 174 mg/dL — ABNORMAL HIGH (ref 70–99)
Potassium: 3.7 mmol/L (ref 3.5–5.1)
Sodium: 140 mmol/L (ref 135–145)
Total Bilirubin: 0.3 mg/dL (ref 0.3–1.2)
Total Protein: 6.7 g/dL (ref 6.5–8.1)

## 2022-12-25 MED ORDER — SODIUM CHLORIDE 0.9 % IV SOLN: Freq: Once | INTRAVENOUS | Status: AC

## 2022-12-25 MED ORDER — ONDANSETRON HCL 8 MG PO TABS: 8.0000 mg | ORAL_TABLET | Freq: Once | ORAL | Status: DC

## 2022-12-25 MED ORDER — ONDANSETRON HCL 4 MG/2ML IJ SOLN
8.0000 mg | Freq: Once | INTRAMUSCULAR | Status: AC
  Administered 2022-12-25: 8 mg via INTRAVENOUS
  Filled 2022-12-25: qty 4

## 2022-12-25 MED ORDER — SODIUM CHLORIDE 0.9% FLUSH
10.0000 mL | INTRAVENOUS | Status: DC | PRN
  Administered 2022-12-25: 10 mL

## 2022-12-25 MED ORDER — SODIUM CHLORIDE 0.9% FLUSH
10.0000 mL | Freq: Once | INTRAVENOUS | Status: AC
  Administered 2022-12-25: 10 mL

## 2022-12-25 MED ORDER — OXYCODONE HCL 10 MG PO TABS: 10.0000 mg | ORAL_TABLET | ORAL | Status: DC | PRN

## 2022-12-25 MED ORDER — SODIUM CHLORIDE 0.9 % IV SOLN
750.0000 mg/m2 | Freq: Once | INTRAVENOUS | Status: AC
  Administered 2022-12-25: 1520 mg via INTRAVENOUS
  Filled 2022-12-25: qty 39.98

## 2022-12-25 NOTE — Progress Notes (Signed)
[place] OFFICE PROGRESS NOTE  Patient Care Team: [name], MD as PCP - General (Hematology and Oncology) [name], MD as Consulting Physician (Hematology and Oncology) Philomena Doheny, MD as Referring Physician (Plastic Surgery) Irene Shipper, MD as Consulting Physician (Gastroenterology)  ASSESSMENT & PLAN:  Malignant neoplasm of head and neck [facility]) He tolerated recent chemotherapy much better He has visible improvement on clinical exam We will continue minimum of 3 months of therapy before repeating imaging study  Cancer associated pain He has minimal pain now He agree for taper of oxycodone dose with the next refill  No orders of the defined types were placed in this encounter.   All questions were answered. The patient knows to call the clinic with any problems, questions or concerns. The total time spent in the appointment was 20 minutes encounter with patients including review of chart and various tests results, discussions about plan of care and coordination of care plan   [name], MD 12/25/2022 12:58 PM  INTERVAL HISTORY: Please see below for problem oriented charting. he returns for treatment follow-up seen prior to chemotherapy He tolerated recent treatment very well He denies peripheral neuropathy No recent nausea vomiting He has minimum pain and is not taking his pain medicine on a regular basis  REVIEW OF SYSTEMS:   Constitutional: Denies fevers, chills or abnormal weight loss Eyes: Denies blurriness of vision Ears, nose, mouth, throat, and face: Denies mucositis or sore throat Respiratory: Denies cough, dyspnea or wheezes Cardiovascular: Denies palpitation, chest discomfort or lower extremity swelling Gastrointestinal:  Denies nausea, heartburn or change in bowel habits Skin: Denies abnormal skin rashes Lymphatics: Denies new lymphadenopathy or easy bruising Neurological:Denies numbness, tingling or new weaknesses Behavioral/Psych:  Mood is stable, no new changes  All other systems were reviewed with the patient and are negative.  I have reviewed the past medical history, past surgical history, social history and family history with the patient and they are unchanged from previous note.  ALLERGIES:  is allergic to phenergan [promethazine hcl], heparin, and clindamycin.  MEDICATIONS:  Current Outpatient Medications  Medication Sig Dispense Refill   blood glucose meter kit and supplies KIT Dispense based on patient and insurance preference. Use up to four times daily as directed. (FOR ICD-9 250.00, 250.01). 1 each 1   cetirizine (ZYRTEC) 10 MG tablet Take 1 tablet (10 mg total) by mouth daily as needed for allergies. 100 tablet 0   dexamethasone (DECADRON) 4 MG tablet Take 1 tablet (4 mg total) by mouth daily for 3 days after chemo, every 2 weeks. 30 tablet 0   levothyroxine (SYNTHROID) 150 MCG tablet Take 1 tablet (150 mcg total) by mouth daily before breakfast. 30 tablet 2   LORazepam (ATIVAN) 0.5 MG tablet Take 1 tablet (0.5 mg total) by mouth 2 (two) times daily as needed for anxiety. 30 tablet 0   metFORMIN (GLUCOPHAGE) 500 MG tablet Take 1 tablet (500 mg total) by mouth 2 (two) times daily with a meal. 60 tablet 2   methadone (DOLOPHINE) 10 MG tablet Take 1 tablet (10 mg total) by mouth every 12 (twelve) hours. 60 tablet 0   ondansetron (ZOFRAN) 8 MG tablet Take 1 tablet (8 mg total) by mouth every 8 (eight) hours as needed for nausea or vomiting. 90 tablet 1   oxyCODONE 10 MG TABS Take 1 tablet (10 mg total) by mouth every 4 (four) hours as needed.     polyethylene glycol (MIRALAX / GLYCOLAX) 17 g packet  Take 17 g by mouth 2 (two) times daily.     senna-docusate (SENOKOT-S) 8.6-50 MG tablet Take 2 tablets by mouth 2 (two) times daily. 100 tablet 3   No current facility-administered medications for this visit.   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 Pan-endoscopy elsewhere showed cancer from nasopharyngeal space.   01/04/2007 - 02/20/2007 Chemotherapy   He received 2 cycles of cisplatin and 5FU followed by concurrent chemo with weekly cisplatin and radiation. He only received 2 doses of chemo due to severe mucositis, nausea and weight loss.   04/05/2007 - 08/04/2007 Chemotherapy   He received 4 more courses of cisplatin with 5FU and had complete response   07/05/2009 Procedure   Fine-needle aspirate of the right level II lymph nodes come from recurrent metastatic disease. Repeat endoscopy and CT scan show no evidence of disease elsewhere.   07/08/2009 - 12/02/2009 Chemotherapy   He was given 6 cycles of carboplatin, 5-FU and docetaxel   12/03/2009 Surgery   He has surgery to the residual lymph node on the right neck which showed no evidence of disease.   02/22/2012 Imaging   Repeat imaging study showed large recurrent mass. He was referred elsewhere for further treatment.   05/03/2012 Surgery   He underwent left upper lobectomy.   04/29/2013 Imaging   PEt  scan showed lesion on right level II B and lower lung was abnormal   06/03/2013 - 02/02/2014 Chemotherapy   He had 6 cycles of chemotherapy when he was found to have recurrence of cancer and had received oxaliplatin and capecitabine   06/07/2014 Imaging   PET CT scan showed persistent disease in the right neck lymph nodes and left lung   06/29/2014 Procedure   Accession: [id number] repeat LUL biopsy confirmed metastatic cancer   07/18/2014 - 07/31/2014 Radiation Therapy   He received palliative radiation therapy to the lungs   10/10/2014 Imaging   CT scan of the chest, abdomen and pelvis show regression in the size of the lung nodule in the left upper lobe and stable pulmonary nodules   01/24/2015 Imaging   CT scan showed stable disease in neck and lung   06/19/2015 Imaging   CT scan of the neck and the chest show possible mild progression of the nodule in the right side of the neck.   06/25/2015 Imaging   PET scan confirmed disease recurrence in the neck   07/07/2015 Imaging   He  [facility]   09/03/2015 - 08/26/2018 Chemotherapy   He received palliative chemo with Nivolumab   10/29/2015 Imaging   PET CT showed positive response to Rx   02/28/2016 Imaging   Ct abdomen showed abnormal thinkening in his stomach   03/03/2016 Imaging   CT: Right sternocleidomastoid muscle metastasis appears less distinct but otherwise not significantly changed in size or configuration since 06/19/2015.2. Left level 3 lymph node which was hypermetabolic by PET-CT in January 2017 appears slightly smaller   04/01/2016 Imaging   CT cervical spine showed no acute fracture or traumatic malalignment in the cervical spine   04/22/2016 Procedure   Port-a-cath placed.   06/16/2016 Imaging   Ct neck showed right sternocleidomastoid muscle metastasis is further decreased in conspicuity since May, and has mildly decreased in size since September 2016. Continued stability of sub-centimeter left cervical lymph  nodes. No new or progressive metastatic disease in the neck.   06/16/2016 Imaging   CT chest showed stable masslike radiation fibrosis in the left upper lobe. Stable subcentimeter pulmonary nodules in the bilateral lower lobes. No new or progressive metastatic disease in the chest. Nonobstructing left renal stone.   10/13/2016 Imaging   Ct neck showed unchanged right sternocleidomastoid muscle metastasis. Unchanged subcentimeter left cervical lymph nodes. No evidence of new or progressive metastatic disease in the neck.   10/13/2016 Imaging   CT chest showed tiny hypervascular foci in the liver, not definitely seen on prior imaging of 06/16/2016 and 02/28/2016. Abdomen MRI without and with contrast recommended to further evaluate as metastatic disease is a concern. 2. Stable appearance of post treatment changes left upper lung and scattered tiny bilateral pulmonary nodules.   02/11/2017 Imaging   Ct neck: Lymph node mass right posterior neck appears improved from the prior study. Small posterior lymph nodes on the left unchanged. Occluded right jugular vein unchanged.   02/11/2017 Imaging   1. Similar appearance of postsurgical and radiation changes in the left upper lobe. 2. Similar bilateral pulmonary nodules. 3. No thoracic adenopathy. 4. Subtle foci of post-contrast enhancement within the liver are suboptimally characterized on this nondedicated study. Likely similar. These could either be re-evaluated at followup or more entirely characterized with abdominal MRI. 5. Left nephrolithiasis.   05/19/2017 Imaging   Matted lymph node mass right posterior neck appears larger in the recent CT. Accurate measurements difficult due to infiltrating tumor margins and infiltration of the muscle. Right jugular vein again appears occluded or resected. Small left posterior lymph nodes stable. Left upper lobe airspace density stable and similar to the prior CT   06/03/2017 PET scan   1. Hypermetabolic  ill-defined right level IIb lymph node, about 1.3 cm in diameter with maximum SUV 9.5 (formerly 8.1). Appearance suspicious for residual/recurrent malignancy. No worrisome left-sided lesion. 2. Left suprahilar indistinct opacity demonstrates no worrisome hypermetabolic activity. The 5 mm left lower lobe pulmonary nodule is stable and not currently hypermetabolic although below sensitive PET-CT size thresholds. 3. Other imaging findings of potential clinical significance: Bilateral nonobstructive nephrolithiasis. Chronic bilateral maxillary sinusitis.   05/11/2018 PET scan   1. Continued chronic accentuated metabolic activity in the vicinity of right level IIB and the adjacent right sternocleidomastoid muscle, with ill definition of surrounding tissue planes. Maximum SUV is currently 8.1, formerly 9.5. Accentuated metabolic activity is been present in this vicinity back through 06/25/2015, and there was also some low-level activity in this vicinity on 06/07/2014. Some of this may be from scarring and local muscular activity  although clearly a component of residual tumor is difficult to exclude given the focally high activity. 2. Other imaging findings of potential clinical significance: Chronic bilateral maxillary sinusitis. Chronic scarring in the left upper lobe. Chronically stable 5 mm left lower lobe nodule is considered benign. Nonobstructive left nephrolithiasis.   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 COMMENT:No significant keratinization is identified. Other basaloid carcinomas are in the differential diagnosis. No cell block material is available for further testing.   09/12/2018 Procedure   He underwent fine Needle Aspiration   10/04/2018 PET scan   1. Significant progression of local recurrence laterally in the mid right neck with an  enlarging, increasingly hypermetabolic soft tissue mass. This involves the right sternocleidomastoid muscle. 2. Small lymph nodes in the right axilla are increasingly hypermetabolic. These are nonspecific and potentially reactive, although could reflect a small metastases. Small hypermetabolic nodule in the left suprasternal notch is unchanged. 3. No other evidence of metastatic disease.     10/07/2018 - 12/23/2018 Chemotherapy   The patient had cisplatin plus gemzar   12/07/2018 Imaging   1. Decreased size of lateral right neck mass. 2. Unchanged soft tissue nodule in the suprasternal notch. 3. No evidence of new metastatic disease in the neck.     05/30/2019 Imaging   CT neck No clear change or progression compared to the study of March. Overall measurements of the right lateral neck mass are similar, approximately 3 x 1.8 cm. See above discussion. One could argue that there is slight increase in lateral bulging, possibly with an increase in contrast enhancement, towards the inferior margin. This is of questionable validity but could possibly represent some progression or inflammatory change. Other findings in the region are stable.   07/20/2019 - 09/15/2019 Chemotherapy   The patient had dexamethasone (DECADRON) 4 MG tablet, 1 of 1 cycle, Start date: --, End date: -- palonosetron (ALOXI) injection 0.25 mg, 0.25 mg, Intravenous,  Once, 3 of 4 cycles Administration: 0.25 mg (07/20/2019), 0.25 mg (08/10/2019), 0.25 mg (09/08/2019) CISplatin (PLATINOL) 84 mg in sodium chloride 0.9 % 250 mL chemo infusion, 40 mg/m2 = 84 mg (80 % of original dose 50 mg/m2), Intravenous,  Once, 3 of 4 cycles Dose modification: 40 mg/m2 (80 % of original dose 50 mg/m2, Cycle 1, Reason: Dose Not Tolerated) Administration: 84 mg (07/20/2019), 84 mg (08/10/2019), 83 mg (09/08/2019) gemcitabine (GEMZAR) 1,600 mg in sodium chloride 0.9 % 250 mL chemo infusion, 1,672 mg (80 % of original dose 1,000 mg/m2), Intravenous,  Once, 3  of 4 cycles Dose modification: 800 mg/m2 (80 % of original dose 1,000 mg/m2, Cycle 1, Reason: Provider Judgment) Administration: 1,600 mg (07/20/2019), 1,600 mg (07/27/2019), 1,600 mg (08/10/2019), 1,600 mg (08/17/2019), 1,600 mg (09/08/2019), 1,672 mg (09/15/2019) ondansetron (ZOFRAN) 8 mg, dexamethasone (DECADRON) 10 mg in sodium chloride 0.9 % 50 mL IVPB, , Intravenous,  Once, 3 of 4 cycles Administration:  (09/15/2019) fosaprepitant (EMEND) 150 mg, dexamethasone (DECADRON) 12 mg in sodium chloride 0.9 % 145 mL IVPB, , Intravenous,  Once, 3 of 4 cycles Administration:  (07/20/2019),  (08/10/2019),  (09/08/2019)  for chemotherapy treatment.    10/02/2019 Imaging   CT neck As compared to 05/30/2019, no significant interval change in size of an ill-defined mass within the right lateral neck, again measuring 3.3 x 1.8 cm in transaxial dimensions.   Unchanged mildly enlarged left level  I lymph node measuring 1.1 cm in short axis.   Unchanged node or nodule at the thoracic inlet, measuring 1.3 x 0.8 cm.   Please refer to concurrently performed chest CT for a description of findings below the level of the thoracic inlet.     10/02/2019 Imaging   CT chest 1. No new or progressive findings in the chest to suggest metastatic disease. 2. Bilateral subcentimeter solid pulmonary nodules are stable since 2018. 3. Hyperdense 1.1 cm anterior liver focus, not clearly visualized on prior studies. Suggest MRI abdomen without and with IV contrast for further characterization.   10/20/2019 - 12/29/2019 Chemotherapy   The patient had ondansetron (ZOFRAN) injection 8 mg, 8 mg (100 % of original dose 8 mg), Intravenous,  Once, 2 of 5 cycles Dose modification: 8 mg (original dose 8 mg, Cycle 2) Administration: 8 mg (11/17/2019), 8 mg (12/15/2019), 8 mg (12/29/2019) gemcitabine (GEMZAR) 2,000 mg in sodium chloride 0.9 % 250 mL chemo infusion, 2,090 mg, Intravenous,  Once, 3 of 6 cycles Administration: 2,000 mg  (10/20/2019), 2,000 mg (11/03/2019), 2,000 mg (11/17/2019), 2,000 mg (12/15/2019), 2,000 mg (12/29/2019)  for chemotherapy treatment.    06/12/2020 Imaging   1. Enlarging superficial, exophytic component of the chronic right sternocleidomastoid muscle mass. See series 6, image 55. 2. Elsewhere stable CT appearance of the Neck.   06/12/2020 Imaging   Post treatment scarring in the left hemithorax, stable. No evidence recurrent or metastatic disease   06/14/2020 - 10/15/2020 Chemotherapy   He received carboplatin, 5FU and Beryle Flock       10/31/2020 Procedure   Interval improvement in right lateral lymph node mass. Improvement in dermal component as well as invasion of the right sternocleidomastoid muscle.   10 mm submental lymph node slightly enlarged compared to the prior study. Continued follow-up recommended.   11/01/2020 - 05/22/2022 Chemotherapy   Patient is on Treatment Plan : HEAD/NECK Pembrolizumab Q21D     07/03/2022 - 10/30/2022 Chemotherapy   Patient is on Treatment Plan : Head and neck Pembrolizumab (400) q42d     11/30/2022 Imaging   CT chest  1. Stable exam. No new or progressive findings to suggest recurrent or metastatic disease. 2. Stable tiny bilateral nodules since 06/12/2020, consistent with benign etiology. 3. Punctate nonobstructing left renal stone.   12/01/2022 Imaging   CT neck  Growing right neck mass since 2022 with epidural tumor extension via the right C2-3 foramen. Cervical MRI with contrast would be contributory.   12/11/2022 -  Chemotherapy   Patient is on Treatment Plan : HEAD/NECK toripalimab-tpzi D1 + cisplatin D1 + gemcitabine D1,8 q21d x 6 cycles / toripalimab-tpzi q21d (up to 37m)       PHYSICAL EXAMINATION: ECOG PERFORMANCE STATUS: 1 - Symptomatic but completely ambulatory  Vitals:   12/25/22 0828  BP: (!) 141/85  Pulse: 93  Resp: 18  Temp: 98.2 F (36.8 C)  SpO2: 100%   Filed Weights   12/25/22 0828  Weight: 187 lb (84.8 kg)     GENERAL:alert, no distress and comfortable NECK: The visible neck mass is smaller NEURO: alert & oriented x 3 with fluent speech, no focal motor/sensory deficits  LABORATORY DATA:  I have reviewed the data as listed    Component Value Date/Time   NA 140 12/25/2022 0745   NA 139 09/22/2017 0829   K 3.7 12/25/2022 0745   K 3.5 09/22/2017 0829   CL 101 12/25/2022 0745   CO2 30 12/25/2022 0745   CO2 26 09/22/2017 0829  GLUCOSE 174 (H) 12/25/2022 0745   GLUCOSE 133 09/22/2017 0829   BUN 16 12/25/2022 0745   BUN 14.1 09/22/2017 0829   CREATININE 1.00 12/25/2022 0745   CREATININE 0.9 09/22/2017 0829   CALCIUM 9.2 12/25/2022 0745   CALCIUM 9.1 09/22/2017 0829   PROT 6.7 12/25/2022 0745   PROT 6.8 09/22/2017 0829   ALBUMIN 4.1 12/25/2022 0745   ALBUMIN 4.1 09/22/2017 0829   AST 24 12/25/2022 0745   AST 22 09/22/2017 0829   ALT 29 12/25/2022 0745   ALT 30 09/22/2017 0829   ALKPHOS 71 12/25/2022 0745   ALKPHOS 55 09/22/2017 0829   BILITOT 0.3 12/25/2022 0745   BILITOT 0.35 09/22/2017 0829   GFRNONAA >60 12/25/2022 0745   GFRAA >60 07/05/2020 0845   GFRAA >60 02/06/2019 1215    No results found for: "SPEP", "UPEP"  Lab Results  Component Value Date   WBC 4.0 12/25/2022   NEUTROABS 2.0 12/25/2022   HGB 11.5 (L) 12/25/2022   HCT 35.4 (L) 12/25/2022   MCV 86.3 12/25/2022   PLT 266 12/25/2022      Chemistry      Component Value Date/Time   NA 140 12/25/2022 0745   NA 139 09/22/2017 0829   K 3.7 12/25/2022 0745   K 3.5 09/22/2017 0829   CL 101 12/25/2022 0745   CO2 30 12/25/2022 0745   CO2 26 09/22/2017 0829   BUN 16 12/25/2022 0745   BUN 14.1 09/22/2017 0829   CREATININE 1.00 12/25/2022 0745   CREATININE 0.9 09/22/2017 0829      Component Value Date/Time   CALCIUM 9.2 12/25/2022 0745   CALCIUM 9.1 09/22/2017 0829   ALKPHOS 71 12/25/2022 0745   ALKPHOS 55 09/22/2017 0829   AST 24 12/25/2022 0745   AST 22 09/22/2017 0829   ALT 29 12/25/2022 0745   ALT  30 09/22/2017 0829   BILITOT 0.3 12/25/2022 0745   BILITOT 0.35 09/22/2017 0829       RADIOGRAPHIC STUDIES: I have personally reviewed the radiological images as listed and agreed with the findings in the report. CT SOFT TISSUE NECK W CONTRAST  Result Date: 12/01/2022 CLINICAL DATA:  Head neck cancer staging.  Growing neck mass. EXAM: CT NECK WITH CONTRAST TECHNIQUE: Multidetector CT imaging of the neck was performed using the standard protocol following the bolus administration of intravenous contrast. RADIATION DOSE REDUCTION: This exam was performed according to the departmental dose-optimization program which includes automated exposure control, adjustment of the mA and/or kV according to patient size and/or use of iterative reconstruction technique. CONTRAST:  47mL OMNIPAQUE IOHEXOL 300 MG/ML  SOLN COMPARISON:  10/31/2020 FINDINGS: Pharynx and larynx: History of nasopharyngeal cancer. No primary mass or inflammation is seen. Salivary glands: Post treatment atrophy of salivary glands. There may have been resection of the right submandibular gland. Thyroid: Symmetric atrophy Lymph nodes: Known mass in the right lateral neck has enlarged in size and increased in enhancement, prominently involving the vertebral space with growth into the right C2-3 foramen with epidural tumor and right vertebral encasement. Ventral and right lateral epidural tumor deforms the thecal sac without suspected cord compression. This mass extends to the skin surface if biopsy is needed. A nodule at the thoracic inlet is unchanged and non worrisome Vascular: Porta catheter on the right. Chronic right IJ occlusion or sacrifice. Limited intracranial: Negative Visualized orbits: Negative Mastoids and visualized paranasal sinuses: Clear Skeleton: Vertebral invasion as noted above. Upper chest: Post treatment left upper lobe where there is history  of remote radiotherapy. No detected change. These results will be called to the  ordering clinician or representative by the Radiologist Assistant, and communication documented in the PACS or [employer]. IMPRESSION: Growing right neck mass since 2022 with epidural tumor extension via the right C2-3 foramen. Cervical MRI with contrast would be contributory. Electronically Signed   By: Jorje Guild M.D.   On: 12/01/2022 05:44   CT CHEST W CONTRAST  Result Date: 11/30/2022 CLINICAL DATA:  Head and neck cancer. Restaging. * Tracking Code: BO * EXAM: CT CHEST WITH CONTRAST TECHNIQUE: Multidetector CT imaging of the chest was performed during intravenous contrast administration. RADIATION DOSE REDUCTION: This exam was performed according to the departmental dose-optimization program which includes automated exposure control, adjustment of the mA and/or kV according to patient size and/or use of iterative reconstruction technique. CONTRAST:  34mL OMNIPAQUE IOHEXOL 300 MG/ML  SOLN COMPARISON:  06/12/2020 FINDINGS: Cardiovascular: The heart size is normal. No substantial pericardial effusion. No thoracic aortic aneurysm. Right Port-A-Cath tip is positioned in the low right atrium Mediastinum/Nodes: No mediastinal lymphadenopathy. There is no hilar lymphadenopathy. The esophagus has normal imaging features. There is no axillary lymphadenopathy. Lungs/Pleura: Postsurgical change with scarring noted suprahilar left upper lobe. Similar appearance 5 mm left lower lobe pulmonary nodule on 63/3. 4 mm right lower lobe nodule on 64/3 is unchanged. No new suspicious pulmonary nodule or mass. No focal airspace consolidation. There is no evidence of pleural effusion. Upper Abdomen: Punctate nonobstructing stone noted upper pole left kidney. Visualized upper abdomen otherwise unremarkable. Musculoskeletal: No worrisome lytic or sclerotic osseous abnormality. IMPRESSION: 1. Stable exam. No new or progressive findings to suggest recurrent or metastatic disease. 2. Stable tiny bilateral nodules since  06/12/2020, consistent with benign etiology. 3. Punctate nonobstructing left renal stone. Electronically Signed   By: Misty Stanley M.D.   On: 11/30/2022 12:03

## 2022-12-25 NOTE — Assessment & Plan Note (Signed)
He tolerated recent chemotherapy much better He has visible improvement on clinical exam We will continue minimum of 3 months of therapy before repeating imaging study

## 2022-12-25 NOTE — Assessment & Plan Note (Signed)
He has minimal pain now He agree for taper of oxycodone dose with the next refill

## 2022-12-25 NOTE — Patient Instructions (Signed)
[facility]  Discharge Instructions: Thank you for choosing [facility] to provide your oncology and hematology care.   If you have a lab appointment with the Cancer Center, please go directly to the Cancer Center and check in at the registration area.   Wear comfortable clothing and clothing appropriate for easy access to any Portacath or PICC line.   We strive to give you quality time with your provider. You may need to reschedule your appointment if you arrive late (15 or more minutes).  Arriving late affects you and other patients whose appointments are after yours.  Also, if you miss three or more appointments without notifying the office, you may be dismissed from the clinic at the provider's discretion.      For prescription refill requests, have your pharmacy contact our office and allow 72 hours for refills to be completed.    Today you received the following chemotherapy and/or immunotherapy agents: Gemzar      To help prevent nausea and vomiting after your treatment, we encourage you to take your nausea medication as directed.  BELOW ARE SYMPTOMS THAT SHOULD BE REPORTED IMMEDIATELY: *FEVER GREATER THAN 100.4 F (38 C) OR HIGHER *CHILLS OR SWEATING *NAUSEA AND VOMITING THAT IS NOT CONTROLLED WITH YOUR NAUSEA MEDICATION *UNUSUAL SHORTNESS OF BREATH *UNUSUAL BRUISING OR BLEEDING *URINARY PROBLEMS (pain or burning when urinating, or frequent urination) *BOWEL PROBLEMS (unusual diarrhea, constipation, pain near the anus) TENDERNESS IN MOUTH AND THROAT WITH OR WITHOUT PRESENCE OF ULCERS (sore throat, sores in mouth, or a toothache) UNUSUAL RASH, SWELLING OR PAIN  UNUSUAL VAGINAL DISCHARGE OR ITCHING   Items with * indicate a potential emergency and should be followed up as soon as possible or go to the Emergency Department if any problems should occur.  Please show the CHEMOTHERAPY ALERT CARD or IMMUNOTHERAPY ALERT CARD at check-in  to the Emergency Department and triage nurse.  Should you have questions after your visit or need to cancel or reschedule your appointment, please contact [facility]  Dept: [phone]  and follow the prompts.  Office hours are 8:00 a.m. to 4:30 p.m. Monday - Friday. Please note that voicemails left after 4:00 p.m. may not be returned until the following business day.  We are closed weekends and major holidays. You have access to a nurse at all times for urgent questions. Please call the main number to the clinic Dept: [phone] and follow the prompts.   For any non-urgent questions, you may also contact your provider using MyChart. We now offer e-Visits for anyone 18 and older to request care online for non-urgent symptoms. For details visit [email or web address].   Also download the MyChart app! Go to the app store, search "MyChart", open the app, select [facility], and log in with your MyChart username and password.   

## 2022-12-30 ENCOUNTER — Other Ambulatory Visit (HOSPITAL_BASED_OUTPATIENT_CLINIC_OR_DEPARTMENT_OTHER): Payer: Self-pay

## 2022-12-30 ENCOUNTER — Other Ambulatory Visit: Payer: Self-pay

## 2022-12-30 ENCOUNTER — Telehealth: Payer: Self-pay

## 2022-12-30 ENCOUNTER — Other Ambulatory Visit: Payer: Self-pay | Admitting: Physician Assistant

## 2022-12-30 DIAGNOSIS — C760 Malignant neoplasm of head, face and neck: Secondary | ICD-10-CM

## 2022-12-30 MED ORDER — METHADONE HCL 10 MG PO TABS
10.0000 mg | ORAL_TABLET | Freq: Two times a day (BID) | ORAL | 0 refills | Status: DC
  Filled 2022-12-30: qty 20, 10d supply, fill #0
  Filled 2022-12-30: qty 40, 20d supply, fill #0

## 2022-12-30 NOTE — Progress Notes (Signed)
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 I have reviewed the PDMP during this encounter. Prescription sent to pharmacy.

## 2022-12-30 NOTE — Telephone Encounter (Signed)
Pt called for refill on Methadone 10 mg 1 tab PO Q12 Hr qty 60. 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 Last Gemzar tx was 3/22.  Pt also reports on 3/24 he noticed gum & throat swelling/soreness. He said in the mornings he has "white stuff" he has to scrape from his mouth. Message sent to PA for refill and for her to advise on mouth sx.

## 2022-12-31 ENCOUNTER — Other Ambulatory Visit (HOSPITAL_BASED_OUTPATIENT_CLINIC_OR_DEPARTMENT_OTHER): Payer: Self-pay

## 2022-12-31 ENCOUNTER — Other Ambulatory Visit: Payer: Self-pay

## 2022-12-31 ENCOUNTER — Inpatient Hospital Stay: Payer: Medicaid Other

## 2022-12-31 ENCOUNTER — Encounter: Payer: Self-pay | Admitting: Hematology and Oncology

## 2022-12-31 ENCOUNTER — Inpatient Hospital Stay (HOSPITAL_BASED_OUTPATIENT_CLINIC_OR_DEPARTMENT_OTHER): Payer: Medicaid Other | Admitting: Physician Assistant

## 2022-12-31 VITALS — BP 155/88 | HR 76 | Temp 98.7°F | Resp 16 | Wt 185.1 lb

## 2022-12-31 DIAGNOSIS — C760 Malignant neoplasm of head, face and neck: Secondary | ICD-10-CM

## 2022-12-31 DIAGNOSIS — Z5111 Encounter for antineoplastic chemotherapy: Secondary | ICD-10-CM | POA: Diagnosis not present

## 2022-12-31 DIAGNOSIS — Z95828 Presence of other vascular implants and grafts: Secondary | ICD-10-CM

## 2022-12-31 DIAGNOSIS — K1230 Oral mucositis (ulcerative), unspecified: Secondary | ICD-10-CM

## 2022-12-31 DIAGNOSIS — C7800 Secondary malignant neoplasm of unspecified lung: Secondary | ICD-10-CM

## 2022-12-31 LAB — CBC WITH DIFFERENTIAL (CANCER CENTER ONLY)
Abs Immature Granulocytes: 0.15 10*3/uL — ABNORMAL HIGH (ref 0.00–0.07)
Basophils Absolute: 0 10*3/uL (ref 0.0–0.1)
Basophils Relative: 1 %
Eosinophils Absolute: 0 10*3/uL (ref 0.0–0.5)
Eosinophils Relative: 1 %
HCT: 33.9 % — ABNORMAL LOW (ref 39.0–52.0)
Hemoglobin: 11.3 g/dL — ABNORMAL LOW (ref 13.0–17.0)
Immature Granulocytes: 4 %
Lymphocytes Relative: 36 %
Lymphs Abs: 1.4 10*3/uL (ref 0.7–4.0)
MCH: 28.5 pg (ref 26.0–34.0)
MCHC: 33.3 g/dL (ref 30.0–36.0)
MCV: 85.6 fL (ref 80.0–100.0)
Monocytes Absolute: 0.4 10*3/uL (ref 0.1–1.0)
Monocytes Relative: 11 %
Neutro Abs: 1.8 10*3/uL (ref 1.7–7.7)
Neutrophils Relative %: 47 %
Platelet Count: 447 10*3/uL — ABNORMAL HIGH (ref 150–400)
RBC: 3.96 MIL/uL — ABNORMAL LOW (ref 4.22–5.81)
RDW: 13.4 % (ref 11.5–15.5)
WBC Count: 3.9 10*3/uL — ABNORMAL LOW (ref 4.0–10.5)
nRBC: 0 % (ref 0.0–0.2)

## 2022-12-31 LAB — CMP (CANCER CENTER ONLY)
ALT: 34 U/L (ref 0–44)
AST: 26 U/L (ref 15–41)
Albumin: 4.3 g/dL (ref 3.5–5.0)
Alkaline Phosphatase: 84 U/L (ref 38–126)
Anion gap: 9 (ref 5–15)
BUN: 13 mg/dL (ref 6–20)
CO2: 29 mmol/L (ref 22–32)
Calcium: 9.4 mg/dL (ref 8.9–10.3)
Chloride: 102 mmol/L (ref 98–111)
Creatinine: 0.87 mg/dL (ref 0.61–1.24)
GFR, Estimated: >60 mL/min (ref >60)
Glucose, Bld: 135 mg/dL — ABNORMAL HIGH (ref 70–99)
Potassium: 4 mmol/L (ref 3.5–5.1)
Sodium: 140 mmol/L (ref 135–145)
Total Bilirubin: 0.2 mg/dL — ABNORMAL LOW (ref 0.3–1.2)
Total Protein: 7.5 g/dL (ref 6.5–8.1)

## 2022-12-31 MED ORDER — SODIUM CHLORIDE 0.9% FLUSH: 10.0000 mL | Freq: Once | INTRAVENOUS | Status: DC

## 2022-12-31 MED ORDER — SODIUM CHLORIDE 0.9 % IV SOLN: Freq: Once | INTRAVENOUS | Status: AC

## 2022-12-31 MED ORDER — AMOXICILLIN-POT CLAVULANATE 875-125 MG PO TABS
1.0000 | ORAL_TABLET | Freq: Two times a day (BID) | ORAL | 0 refills | Status: AC
  Filled 2022-12-31: qty 14, 7d supply, fill #0

## 2022-12-31 MED ORDER — NYSTATIN 100000 UNIT/ML MT SUSP
5.0000 mL | Freq: Four times a day (QID) | OROMUCOSAL | 1 refills | Status: DC | PRN
  Filled 2022-12-31 – 2023-01-29 (×3): qty 180, 9d supply, fill #0

## 2022-12-31 MED ORDER — SODIUM CHLORIDE 0.9% FLUSH
10.0000 mL | Freq: Once | INTRAVENOUS | Status: AC
  Administered 2022-12-31: 10 mL

## 2022-12-31 NOTE — Patient Instructions (Signed)
Dr. Ardeth Perfect is the on call dentist for [facility] today. You can try calling his office to set up an appointment for dental evaluation.  Address: [address], [place], [place] [address] Phone: [phone]

## 2022-12-31 NOTE — Progress Notes (Signed)
Symptom Management Consult Note Okay    Patient Care Team: [name], MD as PCP - General (Hematology and Oncology) [name], MD as Consulting Physician (Hematology and Oncology) Philomena Doheny, MD as Referring Physician (Plastic Surgery) Irene Shipper, MD as Consulting Physician (Gastroenterology)    Name / MRN / DOB: Christian Simmons  [id number]  01-Oct-1986   Date of visit: 12/31/2022   Chief Complaint/Reason for visit: mouth pain, sore throat   Current Therapy: Cisplatin and Gemzar  Last treatment:  Day 8   Cycle 1 on 12/25/22   ASSESSMENT & PLAN: Patient is a 37 y.o. 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 Alvy Bimler.  I have viewed most recent oncology note and lab work.    # Malignant neoplasm of head and neck - Next appointment with oncologist is 01/08/23   #Mucositis -Borderline grade 2/3. Patient can tolerate food although it is very painful. -Patient given IVF in clinic for hydration.  -Prescription sent to pharmacy for Magic mouthwash with lidocaine. -Patient also has poor dentition and reporting dental pain.  No obvious dental abscess however will treat with course of Augmentin as he is immunocompromised. -CBC today shows WBC 3.9, stable anemia.  CMP is overall unremarkable. -Patient requesting information for dentist.  He was given information of on-call dentist for today.  Advised patient it is unlikely he would be able to have a dental procedure while on chemotherapy however he should try to establish care with one. -Strict ED precautions discussed should symptoms worsen.       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 Pan-endoscopy elsewhere showed cancer from nasopharyngeal space.   01/04/2007 - 02/20/2007 Chemotherapy   He received 2 cycles of cisplatin and 5FU followed by concurrent chemo with weekly cisplatin and radiation. He only received 2 doses of chemo due to severe mucositis, nausea and weight loss.   04/05/2007 - 08/04/2007 Chemotherapy   He received 4 more courses of cisplatin with 5FU and had complete response   07/05/2009 Procedure   Fine-needle aspirate of the right level II lymph nodes come from recurrent metastatic disease. Repeat endoscopy and CT scan show no evidence of disease elsewhere.   07/08/2009 - 12/02/2009 Chemotherapy   He was given 6 cycles of carboplatin, 5-FU and docetaxel   12/03/2009 Surgery   He has surgery to the residual lymph node on the right neck which showed no evidence of disease.   02/22/2012 Imaging   Repeat imaging study showed large recurrent mass. He was referred elsewhere for further treatment.   05/03/2012 Surgery   He underwent left upper lobectomy.   04/29/2013 Imaging   PEt scan showed lesion on right level II B and lower lung was abnormal   06/03/2013 - 02/02/2014 Chemotherapy   He had 6 cycles of chemotherapy when he was found to have recurrence of cancer and had received  oxaliplatin and capecitabine   06/07/2014 Imaging   PET CT scan showed persistent disease in the right neck lymph nodes and left lung   06/29/2014 Procedure   Accession: [id number] repeat LUL biopsy confirmed metastatic cancer   07/18/2014 - 07/31/2014 Radiation Therapy   He received palliative radiation therapy to the lungs   10/10/2014 Imaging   CT scan of the chest, abdomen and pelvis show regression in the size of the lung  nodule in the left upper lobe and stable pulmonary nodules   01/24/2015 Imaging   CT scan showed stable disease in neck and lung   06/19/2015 Imaging   CT scan of the neck and the chest show possible mild progression of the nodule in the right side of the neck.   06/25/2015 Imaging   PET scan confirmed disease recurrence in the neck   07/07/2015 Imaging   He had MRI neck at [facility]   09/03/2015 - 08/26/2018 Chemotherapy   He received palliative chemo with Nivolumab   10/29/2015 Imaging   PET CT showed positive response to Rx   02/28/2016 Imaging   Ct abdomen showed abnormal thinkening in his stomach   03/03/2016 Imaging   CT: Right sternocleidomastoid muscle metastasis appears less distinct but otherwise not significantly changed in size or configuration since 06/19/2015.2. Left level 3 lymph node which was hypermetabolic by PET-CT in January 2017 appears slightly smaller   04/01/2016 Imaging   CT cervical spine showed no acute fracture or traumatic malalignment in the cervical spine   04/22/2016 Procedure   Port-a-cath placed.   06/16/2016 Imaging   Ct neck showed right sternocleidomastoid muscle metastasis is further decreased in conspicuity since May, and has mildly decreased in size since September 2016. Continued stability of sub-centimeter left cervical lymph nodes. No new or progressive metastatic disease in the neck.   06/16/2016 Imaging   CT chest showed stable masslike radiation fibrosis in the left upper lobe. Stable subcentimeter pulmonary nodules in the bilateral lower lobes. No new or progressive metastatic disease in the chest. Nonobstructing left renal stone.   10/13/2016 Imaging   Ct neck showed unchanged right sternocleidomastoid muscle metastasis. Unchanged subcentimeter left cervical lymph nodes. No evidence of new or progressive metastatic disease in the neck.   10/13/2016 Imaging   CT chest showed tiny hypervascular foci in the liver, not definitely seen on prior imaging  of 06/16/2016 and 02/28/2016. Abdomen MRI without and with contrast recommended to further evaluate as metastatic disease is a concern. 2. Stable appearance of post treatment changes left upper lung and scattered tiny bilateral pulmonary nodules.   02/11/2017 Imaging   Ct neck: Lymph node mass right posterior neck appears improved from the prior study. Small posterior lymph nodes on the left unchanged. Occluded right jugular vein unchanged.   02/11/2017 Imaging   1. Similar appearance of postsurgical and radiation changes in the left upper lobe. 2. Similar bilateral pulmonary nodules. 3. No thoracic adenopathy. 4. Subtle foci of post-contrast enhancement within the liver are suboptimally characterized on this nondedicated study. Likely similar. These could either be re-evaluated at followup or more entirely characterized with abdominal MRI. 5. Left nephrolithiasis.   05/19/2017 Imaging   Matted lymph node mass right posterior neck appears larger in the recent CT. Accurate measurements difficult due to infiltrating tumor margins and infiltration of the muscle. Right jugular vein again appears occluded or resected. Small left posterior lymph nodes stable. Left upper lobe airspace density stable and similar to the prior CT   06/03/2017 PET  scan   1. Hypermetabolic ill-defined right level IIb lymph node, about 1.3 cm in diameter with maximum SUV 9.5 (formerly 8.1). Appearance suspicious for residual/recurrent malignancy. No worrisome left-sided lesion. 2. Left suprahilar indistinct opacity demonstrates no worrisome hypermetabolic activity. The 5 mm left lower lobe pulmonary nodule is stable and not currently hypermetabolic although below sensitive PET-CT size thresholds. 3. Other imaging findings of potential clinical significance: Bilateral nonobstructive nephrolithiasis. Chronic bilateral maxillary sinusitis.   05/11/2018 PET scan   1. Continued chronic accentuated metabolic activity in the vicinity  of right level IIB and the adjacent right sternocleidomastoid muscle, with ill definition of surrounding tissue planes. Maximum SUV is currently 8.1, formerly 9.5. Accentuated metabolic activity is been present in this vicinity back through 06/25/2015, and there was also some low-level activity in this vicinity on 06/07/2014. Some of this may be from scarring and local muscular activity although clearly a component of residual tumor is difficult to exclude given the focally high activity. 2. Other imaging findings of potential clinical significance: Chronic bilateral maxillary sinusitis. Chronic scarring in the left upper lobe. Chronically stable 5 mm left lower lobe nodule is considered benign. Nonobstructive left nephrolithiasis.   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 COMMENT:No significant keratinization is identified. Other basaloid carcinomas are in the differential diagnosis. No cell block material is available for further testing.   09/12/2018 Procedure   He underwent fine Needle Aspiration   10/04/2018 PET scan   1. Significant progression of local recurrence laterally in the mid right neck with an enlarging, increasingly hypermetabolic soft tissue mass. This involves the right sternocleidomastoid muscle. 2. Small lymph nodes in the right axilla are increasingly hypermetabolic. These are nonspecific and potentially reactive, although could reflect a small metastases. Small hypermetabolic nodule in the left suprasternal notch is unchanged. 3. No other evidence of metastatic disease.     10/07/2018 - 12/23/2018 Chemotherapy   The patient had cisplatin plus gemzar   12/07/2018 Imaging   1. Decreased size of lateral right neck mass. 2. Unchanged soft tissue nodule in the suprasternal notch. 3. No evidence of new metastatic disease in the  neck.     05/30/2019 Imaging   CT neck No clear change or progression compared to the study of March. Overall measurements of the right lateral neck mass are similar, approximately 3 x 1.8 cm. See above discussion. One could argue that there is slight increase in lateral bulging, possibly with an increase in contrast enhancement, towards the inferior margin. This is of questionable validity but could possibly represent some progression or inflammatory change. Other findings in the region are stable.   07/20/2019 - 09/15/2019 Chemotherapy   The patient had dexamethasone (DECADRON) 4 MG tablet, 1 of 1 cycle, Start date: --, End date: -- palonosetron (ALOXI) injection 0.25 mg, 0.25 mg, Intravenous,  Once, 3 of 4 cycles Administration: 0.25 mg (07/20/2019), 0.25 mg (08/10/2019), 0.25 mg (09/08/2019) CISplatin (PLATINOL) 84 mg in sodium chloride 0.9 % 250 mL chemo infusion, 40 mg/m2 = 84 mg (80 % of original dose 50 mg/m2), Intravenous,  Once, 3 of 4 cycles Dose modification: 40 mg/m2 (80 % of original dose 50 mg/m2, Cycle 1, Reason: Dose Not Tolerated) Administration: 84 mg (07/20/2019), 84 mg (08/10/2019), 83 mg (09/08/2019) gemcitabine (GEMZAR) 1,600 mg in sodium chloride 0.9 % 250 mL chemo infusion, 1,672 mg (80 % of original  dose 1,000 mg/m2), Intravenous,  Once, 3 of 4 cycles Dose modification: 800 mg/m2 (80 % of original dose 1,000 mg/m2, Cycle 1, Reason: Provider Judgment) Administration: 1,600 mg (07/20/2019), 1,600 mg (07/27/2019), 1,600 mg (08/10/2019), 1,600 mg (08/17/2019), 1,600 mg (09/08/2019), 1,672 mg (09/15/2019) ondansetron (ZOFRAN) 8 mg, dexamethasone (DECADRON) 10 mg in sodium chloride 0.9 % 50 mL IVPB, , Intravenous,  Once, 3 of 4 cycles Administration:  (09/15/2019) fosaprepitant (EMEND) 150 mg, dexamethasone (DECADRON) 12 mg in sodium chloride 0.9 % 145 mL IVPB, , Intravenous,  Once, 3 of 4 cycles Administration:  (07/20/2019),  (08/10/2019),  (09/08/2019)  for chemotherapy treatment.     10/02/2019 Imaging   CT neck As compared to 05/30/2019, no significant interval change in size of an ill-defined mass within the right lateral neck, again measuring 3.3 x 1.8 cm in transaxial dimensions.   Unchanged mildly enlarged left level I lymph node measuring 1.1 cm in short axis.   Unchanged node or nodule at the thoracic inlet, measuring 1.3 x 0.8 cm.   Please refer to concurrently performed chest CT for a description of findings below the level of the thoracic inlet.     10/02/2019 Imaging   CT chest 1. No new or progressive findings in the chest to suggest metastatic disease. 2. Bilateral subcentimeter solid pulmonary nodules are stable since 2018. 3. Hyperdense 1.1 cm anterior liver focus, not clearly visualized on prior studies. Suggest MRI abdomen without and with IV contrast for further characterization.   10/20/2019 - 12/29/2019 Chemotherapy   The patient had ondansetron (ZOFRAN) injection 8 mg, 8 mg (100 % of original dose 8 mg), Intravenous,  Once, 2 of 5 cycles Dose modification: 8 mg (original dose 8 mg, Cycle 2) Administration: 8 mg (11/17/2019), 8 mg (12/15/2019), 8 mg (12/29/2019) gemcitabine (GEMZAR) 2,000 mg in sodium chloride 0.9 % 250 mL chemo infusion, 2,090 mg, Intravenous,  Once, 3 of 6 cycles Administration: 2,000 mg (10/20/2019), 2,000 mg (11/03/2019), 2,000 mg (11/17/2019), 2,000 mg (12/15/2019), 2,000 mg (12/29/2019)  for chemotherapy treatment.    06/12/2020 Imaging   1. Enlarging superficial, exophytic component of the chronic right sternocleidomastoid muscle mass. See series 6, image 55. 2. Elsewhere stable CT appearance of the Neck.   06/12/2020 Imaging   Post treatment scarring in the left hemithorax, stable. No evidence recurrent or metastatic disease   06/14/2020 - 10/15/2020 Chemotherapy   He received carboplatin, 5FU and Beryle Flock       10/31/2020 Procedure   Interval improvement in right lateral lymph node mass. Improvement in dermal component as  well as invasion of the right sternocleidomastoid muscle.   10 mm submental lymph node slightly enlarged compared to the prior study. Continued follow-up recommended.   11/01/2020 - 05/22/2022 Chemotherapy   Patient is on Treatment Plan : HEAD/NECK Pembrolizumab Q21D     07/03/2022 - 10/30/2022 Chemotherapy   Patient is on Treatment Plan : Head and neck Pembrolizumab (400) q42d     11/30/2022 Imaging   CT chest  1. Stable exam. No new or progressive findings to suggest recurrent or metastatic disease. 2. Stable tiny bilateral nodules since 06/12/2020, consistent with benign etiology. 3. Punctate nonobstructing left renal stone.   12/01/2022 Imaging   CT neck  Growing right neck mass since 2022 with epidural tumor extension via the right C2-3 foramen. Cervical MRI with contrast would be contributory.   12/11/2022 -  Chemotherapy   Patient is on Treatment Plan : HEAD/NECK toripalimab-tpzi D1 + cisplatin D1 + gemcitabine D1,8 q21d x  6 cycles / toripalimab-tpzi q21d (up to 50m)         Interval history-: Christian Simmons is a 37 y.o. male with oncologic history as above presenting to [facility] today with chief complaint of mouth pain and sore throat.  Patient presents unaccompanied to clinic.  Patient states he typically has the symptoms for 3 days after chemotherapy.  This time the symptoms are not improving.  They have been present x 5 days and are progressively worsening.  Patient noticed the white coating in his mouth.  He is describing sharp pain in his mouth and when swallowing.  He has not had any fever or chills.  Patient does wear dentures and reports they are having a hard time staying in place.  He is also asking for the contact information of a dentist associated with [facility] because he has several broken teeth that need to be evaluated.  Patient estimates drinking 40 ounces of fluid per day.  He has used Magic mouthwash in the past however does not have any at this  time.     ROS  All other systems are reviewed and are negative for acute change except as noted in the HPI.    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   Nasopharyngeal cancer ([place]) 06/01/2014   Neuropathy    Radiation 07/18/14-07/31/14   Left upper lobe  40 gy in 10 fractions   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     Past Surgical History:  Procedure Laterality Date   IR CV LINE INJECTION  10/09/2022   LUNG REMOVAL, PARTIAL  05/03/2012   left upper lobectomy   nasal biopsy     RADICAL NECK DISSECTION     VIDEO BRONCHOSCOPY N/A 06/29/2014   Procedure: VIDEO BRONCHOSCOPY ;  Surgeon: Melrose Nakayama, MD;  Location: [facility] OR;  Service: Thoracic;  Laterality: N/A;    Social History   Socioeconomic History   Marital status: Married    Spouse name: Not on file   Number of children: 1   Years of education: Not on file   Highest education level: Not on file  Occupational History   Not on file  Tobacco Use   Smoking status: Never   Smokeless tobacco: Never  Vaping Use   Vaping Use: Never used  Substance and Sexual Activity   Alcohol use: No   Drug use: No   Sexual activity: Not on file  Other Topics Concern   Not on file  Social History Narrative   ** Merged History Encounter **       Social Determinants of Health   Financial Resource Strain: Not on file  Food Insecurity: Not on file  Transportation Needs: Not on file  Physical Activity: Not on file  Stress: Not on file  Social  Connections: Not on file  Intimate Partner Violence: Not on file    Family History  Problem Relation Age of Onset  Hypertension Mother    Diabetes Mother    Hypertension Father    Diabetes Father      Current Outpatient Medications:    amoxicillin-clavulanate (AUGMENTIN) 875-125 MG tablet, Take 1 tablet by mouth 2 (two) times daily for 7 days., Disp: 14 tablet, Rfl: 0   magic mouthwash (nystatin, lidocaine, diphenhydrAMINE, alum & mag hydroxide) suspension, Swish and swallow 5 mLs 4 (four) times daily as needed for mouth pain., Disp: 180 mL, Rfl: 1   blood glucose meter kit and supplies KIT, Dispense based on patient and insurance preference. Use up to four times daily as directed. (FOR ICD-9 250.00, 250.01)., Disp: 1 each, Rfl: 1   cetirizine (ZYRTEC) 10 MG tablet, Take 1 tablet (10 mg total) by mouth daily as needed for allergies., Disp: 100 tablet, Rfl: 0   dexamethasone (DECADRON) 4 MG tablet, Take 1 tablet (4 mg total) by mouth daily for 3 days after chemo, every 2 weeks., Disp: 30 tablet, Rfl: 0   levothyroxine (SYNTHROID) 150 MCG tablet, Take 1 tablet (150 mcg total) by mouth daily before breakfast., Disp: 30 tablet, Rfl: 2   LORazepam (ATIVAN) 0.5 MG tablet, Take 1 tablet (0.5 mg total) by mouth 2 (two) times daily as needed for anxiety., Disp: 30 tablet, Rfl: 0   metFORMIN (GLUCOPHAGE) 500 MG tablet, Take 1 tablet (500 mg total) by mouth 2 (two) times daily with a meal., Disp: 60 tablet, Rfl: 2   methadone (DOLOPHINE) 10 MG tablet, Take 1 tablet (10 mg total) by mouth every 12 (twelve) hours., Disp: 60 tablet, Rfl: 0   ondansetron (ZOFRAN) 8 MG tablet, Take 1 tablet (8 mg total) by mouth every 8 (eight) hours as needed for nausea or vomiting., Disp: 90 tablet, Rfl: 1   oxyCODONE 10 MG TABS, Take 1 tablet (10 mg total) by mouth every 4 (four) hours as needed., Disp: , Rfl:    polyethylene glycol (MIRALAX / GLYCOLAX) 17 g packet, Take 17 g by mouth 2 (two) times daily., Disp: , Rfl:     senna-docusate (SENOKOT-S) 8.6-50 MG tablet, Take 2 tablets by mouth 2 (two) times daily., Disp: 100 tablet, Rfl: 3  Current Facility-Administered Medications:    sodium chloride flush (NS) 0.9 % injection 10 mL, 10 mL, Intracatheter, Once, Alvy Bimler, Ni, MD  Facility-Administered Medications Ordered in Other Visits:    anticoagulant sodium citrate solution 5 mL, 5 mL, Intracatheter, Once, Gorsuch, Ni, MD   anticoagulant sodium citrate solution 5 mL, 5 mL, Intracatheter, Once, Gorsuch, Ni, MD  PHYSICAL EXAM: ECOG FS:1 - Symptomatic but completely ambulatory    Vitals:   12/31/22 1216 12/31/22 1329  BP: 133/82 (!) 155/88  Pulse: 99 76  Resp: 17 16  Temp: 98.7 F (37.1 C)   TempSrc: Oral   SpO2: 100% 100%  Weight: 185 lb 1.6 oz (84 kg)    Physical Exam Vitals and nursing note reviewed.  Constitutional:      Appearance: He is well-developed. He is not ill-appearing or toxic-appearing.     Comments: Tolerating secretions without difficulty  HENT:     Head: Normocephalic.     Comments: Erythema and ulcers to oral mucosa.  White coating on buccal mucosa.  Patient has dentures.  He has multiple broken teeth numbers 2 through 5.  No dental abscess.    Nose: Nose normal.  Eyes:     Conjunctiva/sclera: Conjunctivae normal.  Neck:     Vascular: No JVD.  Cardiovascular:     Rate and Rhythm: Normal rate  and regular rhythm.     Pulses: Normal pulses.     Heart sounds: Normal heart sounds.  Pulmonary:     Effort: Pulmonary effort is normal.     Breath sounds: Normal breath sounds.  Abdominal:     General: There is no distension.  Musculoskeletal:     Cervical back: Normal range of motion.  Lymphadenopathy:     Head:     Right side of head: No submandibular adenopathy.     Left side of head: No submandibular adenopathy.  Skin:    General: Skin is warm and dry.  Neurological:     Mental Status: He is oriented to person, place, and time.        LABORATORY DATA: I have  reviewed the data as listed    Latest Ref Rng & Units 12/31/2022   11:51 AM 12/25/2022    7:45 AM 12/11/2022    8:10 AM  CBC  WBC 4.0 - 10.5 K/uL 3.9  4.0  5.4   Hemoglobin 13.0 - 17.0 g/dL 11.3  11.5  11.9   Hematocrit 39.0 - 52.0 % 33.9  35.4  36.1   Platelets 150 - 400 K/uL 447  266  249         Latest Ref Rng & Units 12/31/2022   11:51 AM 12/25/2022    7:45 AM 12/11/2022    8:10 AM  CMP  Glucose 70 - 99 mg/dL 135  174  160   BUN 6 - 20 mg/dL 13  16  18    Creatinine 0.61 - 1.24 mg/dL 0.87  1.00  1.07   Sodium 135 - 145 mmol/L 140  140  137   Potassium 3.5 - 5.1 mmol/L 4.0  3.7  3.3   Chloride 98 - 111 mmol/L 102  101  99   CO2 22 - 32 mmol/L 29  30  31    Calcium 8.9 - 10.3 mg/dL 9.4  9.2  9.3   Total Protein 6.5 - 8.1 g/dL 7.5  6.7  7.2   Total Bilirubin 0.3 - 1.2 mg/dL 0.2  0.3  0.2   Alkaline Phos 38 - 126 U/L 84  71  62   AST 15 - 41 U/L 26  24  19    ALT 0 - 44 U/L 34  29  18        RADIOGRAPHIC STUDIES (from last 24 hours if applicable) I have personally reviewed the radiological images as listed and agreed with the findings in the report. No results found.      Visit Diagnosis: 1. Malignant neoplasm of head and neck ([place])   2. Port-A-Cath in place   3. Mucositis oral      No orders of the defined types were placed in this encounter.   All questions were answered. The patient knows to call the clinic with any problems, questions or concerns. No barriers to learning was detected.  I have spent a total of 30 minutes minutes of face-to-face and non-face-to-face time, preparing to see the patient, obtaining and/or reviewing separately obtained history, performing a medically appropriate examination, counseling and educating the patient, ordering tests and medications, documenting clinical information in the electronic health record.  Thank you for allowing me to participate in the care of this patient.    Barrie Folk, PA-C Department of  Hematology/Oncology [facility] Phone: [phone]  [phone]    12/31/2022 2:51 PM

## 2023-01-05 ENCOUNTER — Other Ambulatory Visit: Payer: Self-pay

## 2023-01-06 ENCOUNTER — Encounter: Payer: Self-pay | Admitting: Hematology and Oncology

## 2023-01-07 ENCOUNTER — Other Ambulatory Visit (HOSPITAL_BASED_OUTPATIENT_CLINIC_OR_DEPARTMENT_OTHER): Payer: Self-pay

## 2023-01-07 MED FILL — Fosaprepitant Dimeglumine For IV Infusion 150 MG (Base Eq): INTRAVENOUS | Qty: 5 | Status: AC

## 2023-01-07 MED FILL — Dexamethasone Sodium Phosphate Inj 100 MG/10ML: INTRAMUSCULAR | Qty: 1 | Status: AC

## 2023-01-08 ENCOUNTER — Inpatient Hospital Stay: Payer: Medicaid Other

## 2023-01-08 ENCOUNTER — Inpatient Hospital Stay: Payer: Medicaid Other | Attending: Hematology and Oncology | Admitting: Hematology and Oncology

## 2023-01-08 ENCOUNTER — Encounter: Payer: Self-pay | Admitting: Hematology and Oncology

## 2023-01-08 ENCOUNTER — Other Ambulatory Visit: Payer: Self-pay

## 2023-01-08 ENCOUNTER — Other Ambulatory Visit (HOSPITAL_BASED_OUTPATIENT_CLINIC_OR_DEPARTMENT_OTHER): Payer: Self-pay

## 2023-01-08 VITALS — BP 138/95 | HR 86 | Temp 98.3°F | Resp 17 | Wt 186.2 lb

## 2023-01-08 DIAGNOSIS — Z7189 Other specified counseling: Secondary | ICD-10-CM

## 2023-01-08 DIAGNOSIS — C119 Malignant neoplasm of nasopharynx, unspecified: Secondary | ICD-10-CM | POA: Diagnosis present

## 2023-01-08 DIAGNOSIS — Z5111 Encounter for antineoplastic chemotherapy: Secondary | ICD-10-CM | POA: Insufficient documentation

## 2023-01-08 DIAGNOSIS — G893 Neoplasm related pain (acute) (chronic): Secondary | ICD-10-CM | POA: Diagnosis not present

## 2023-01-08 DIAGNOSIS — C7949 Secondary malignant neoplasm of other parts of nervous system: Secondary | ICD-10-CM | POA: Insufficient documentation

## 2023-01-08 DIAGNOSIS — C7800 Secondary malignant neoplasm of unspecified lung: Secondary | ICD-10-CM

## 2023-01-08 DIAGNOSIS — C760 Malignant neoplasm of head, face and neck: Secondary | ICD-10-CM

## 2023-01-08 DIAGNOSIS — E860 Dehydration: Secondary | ICD-10-CM | POA: Diagnosis not present

## 2023-01-08 DIAGNOSIS — K1231 Oral mucositis (ulcerative) due to antineoplastic therapy: Secondary | ICD-10-CM

## 2023-01-08 DIAGNOSIS — Z95828 Presence of other vascular implants and grafts: Secondary | ICD-10-CM

## 2023-01-08 LAB — CBC WITH DIFFERENTIAL (CANCER CENTER ONLY)
Abs Immature Granulocytes: 0.03 10*3/uL (ref 0.00–0.07)
Basophils Absolute: 0 10*3/uL (ref 0.0–0.1)
Basophils Relative: 1 %
Eosinophils Absolute: 0.2 10*3/uL (ref 0.0–0.5)
Eosinophils Relative: 3 %
HCT: 33.5 % — ABNORMAL LOW (ref 39.0–52.0)
Hemoglobin: 11.2 g/dL — ABNORMAL LOW (ref 13.0–17.0)
Immature Granulocytes: 1 %
Lymphocytes Relative: 33 %
Lymphs Abs: 1.8 10*3/uL (ref 0.7–4.0)
MCH: 29 pg (ref 26.0–34.0)
MCHC: 33.4 g/dL (ref 30.0–36.0)
MCV: 86.8 fL (ref 80.0–100.0)
Monocytes Absolute: 0.7 10*3/uL (ref 0.1–1.0)
Monocytes Relative: 13 %
Neutro Abs: 2.7 10*3/uL (ref 1.7–7.7)
Neutrophils Relative %: 49 %
Platelet Count: 192 10*3/uL (ref 150–400)
RBC: 3.86 MIL/uL — ABNORMAL LOW (ref 4.22–5.81)
RDW: 14.8 % (ref 11.5–15.5)
WBC Count: 5.4 10*3/uL (ref 4.0–10.5)
nRBC: 0 % (ref 0.0–0.2)

## 2023-01-08 LAB — CMP (CANCER CENTER ONLY)
ALT: 34 U/L (ref 0–44)
AST: 25 U/L (ref 15–41)
Albumin: 4.2 g/dL (ref 3.5–5.0)
Alkaline Phosphatase: 73 U/L (ref 38–126)
Anion gap: 7 (ref 5–15)
BUN: 12 mg/dL (ref 6–20)
CO2: 31 mmol/L (ref 22–32)
Calcium: 9.3 mg/dL (ref 8.9–10.3)
Chloride: 102 mmol/L (ref 98–111)
Creatinine: 0.89 mg/dL (ref 0.61–1.24)
GFR, Estimated: >60 mL/min (ref >60)
Glucose, Bld: 151 mg/dL — ABNORMAL HIGH (ref 70–99)
Potassium: 3.7 mmol/L (ref 3.5–5.1)
Sodium: 140 mmol/L (ref 135–145)
Total Bilirubin: 0.2 mg/dL — ABNORMAL LOW (ref 0.3–1.2)
Total Protein: 7 g/dL (ref 6.5–8.1)

## 2023-01-08 LAB — MAGNESIUM: Magnesium: 1.9 mg/dL (ref 1.7–2.4)

## 2023-01-08 MED ORDER — OXYCODONE HCL 10 MG PO TABS
10.0000 mg | ORAL_TABLET | ORAL | 0 refills | Status: DC | PRN
  Filled 2023-01-08: qty 90, 15d supply, fill #0

## 2023-01-08 MED ORDER — SODIUM CHLORIDE 0.9 % IV SOLN
150.0000 mg | Freq: Once | INTRAVENOUS | Status: AC
  Administered 2023-01-08: 150 mg via INTRAVENOUS
  Filled 2023-01-08: qty 150

## 2023-01-08 MED ORDER — TORIPALIMAB-TPZI CHEMO INJECTION 240 MG/6ML
240.0000 mg | Freq: Once | INTRAVENOUS | Status: AC
  Administered 2023-01-08: 240 mg via INTRAVENOUS
  Filled 2023-01-08: qty 6

## 2023-01-08 MED ORDER — SODIUM CHLORIDE 0.9 % IV SOLN
750.0000 mg/m2 | Freq: Once | INTRAVENOUS | Status: AC
  Administered 2023-01-08: 1520 mg via INTRAVENOUS
  Filled 2023-01-08: qty 39.98

## 2023-01-08 MED ORDER — POTASSIUM CHLORIDE IN NACL 20-0.9 MEQ/L-% IV SOLN
Freq: Once | INTRAVENOUS | Status: AC
  Filled 2023-01-08: qty 1000

## 2023-01-08 MED ORDER — PALONOSETRON HCL INJECTION 0.25 MG/5ML
0.2500 mg | Freq: Once | INTRAVENOUS | Status: AC
  Administered 2023-01-08: 0.25 mg via INTRAVENOUS
  Filled 2023-01-08: qty 5

## 2023-01-08 MED ORDER — SODIUM CHLORIDE 0.9 % IV SOLN
40.0000 mg/m2 | Freq: Once | INTRAVENOUS | Status: AC
  Administered 2023-01-08: 82 mg via INTRAVENOUS
  Filled 2023-01-08: qty 82

## 2023-01-08 MED ORDER — SODIUM CHLORIDE 0.9% FLUSH
10.0000 mL | INTRAVENOUS | Status: DC | PRN
  Administered 2023-01-08: 10 mL

## 2023-01-08 MED ORDER — SODIUM CHLORIDE 0.9 % IV SOLN
10.0000 mg | Freq: Once | INTRAVENOUS | Status: AC
  Administered 2023-01-08: 10 mg via INTRAVENOUS
  Filled 2023-01-08: qty 10

## 2023-01-08 MED ORDER — MAGNESIUM SULFATE 2 GM/50ML IV SOLN
2.0000 g | Freq: Once | INTRAVENOUS | Status: AC
  Administered 2023-01-08: 2 g via INTRAVENOUS
  Filled 2023-01-08: qty 50

## 2023-01-08 MED ORDER — SODIUM CHLORIDE 0.9 % IV SOLN: Freq: Once | INTRAVENOUS | Status: AC

## 2023-01-08 MED ORDER — SODIUM CHLORIDE 0.9% FLUSH
10.0000 mL | Freq: Once | INTRAVENOUS | Status: AC | PRN
  Administered 2023-01-08: 10 mL

## 2023-01-08 MED ORDER — SODIUM CHLORIDE 0.9 % IV SOLN
60.0000 mg/m2 | Freq: Once | INTRAVENOUS | Status: DC
  Filled 2023-01-08: qty 122

## 2023-01-08 NOTE — Assessment & Plan Note (Signed)
He has minimal pain now He agree for taper of oxycodone dose with the next refill 

## 2023-01-08 NOTE — Progress Notes (Signed)
[facility] OFFICE PROGRESS NOTE  Patient Care Team: Artis Delay, MD as PCP - General (Hematology and Oncology) Artis Delay, MD as Consulting Physician (Hematology and Oncology) Elfredia Nevins, MD as Referring Physician (Plastic Surgery) Hilarie Fredrickson, MD as Consulting Physician (Gastroenterology)  ASSESSMENT & PLAN:  Malignant neoplasm of head and neck [facility]) He felt better and started to have significant nausea and dehydration I recommend reducing the dose of cisplatin especially with ongoing mucositis We will provide additional supportive care with IV fluids tomorrow as well as next week I recommend repeat imaging study after 3 months of treatment  Cancer associated pain He has minimal pain now He agree for taper of oxycodone dose with the next refill  Mucositis due to antineoplastic therapy This is due to side effects of treatment I recommend Magic mouthwash We will reduce the dose of treatment as above  No orders of the defined types were placed in this encounter.   All questions were answered. The patient knows to call the clinic with any problems, questions or concerns. The total time spent in the appointment was 40 minutes encounter with patients including review of chart and various tests results, discussions about plan of care and coordination of care plan   Artis Delay, MD 01/08/2023 4:36 PM  INTERVAL HISTORY: Please see below for problem oriented charting. he returns for treatment follow-up seen in the infusion room He felt that with significant nausea mucositis and dehydration His pain is well-controlled He denies recent constipation  REVIEW OF SYSTEMS:   Constitutional: Denies fevers, chills or abnormal weight loss Eyes: Denies blurriness of vision Respiratory: Denies cough, dyspnea or wheezes Cardiovascular: Denies palpitation, chest discomfort or lower extremity swelling Gastrointestinal:  Denies nausea, heartburn or change in bowel  habits Skin: Denies abnormal skin rashes Lymphatics: Denies new lymphadenopathy or easy bruising Neurological:Denies numbness, tingling or new weaknesses Behavioral/Psych: Mood is stable, no new changes  All other systems were reviewed with the patient and are negative.  I have reviewed the past medical history, past surgical history, social history and family history with the patient and they are unchanged from previous note.  ALLERGIES:  is allergic to phenergan [promethazine hcl], heparin, and clindamycin.  MEDICATIONS:  Current Outpatient Medications  Medication Sig Dispense Refill   blood glucose meter kit and supplies KIT Dispense based on patient and insurance preference. Use up to four times daily as directed. (FOR ICD-9 250.00, 250.01). 1 each 1   cetirizine (ZYRTEC) 10 MG tablet Take 1 tablet (10 mg total) by mouth daily as needed for allergies. 100 tablet 0   dexamethasone (DECADRON) 4 MG tablet Take 1 tablet (4 mg total) by mouth daily for 3 days after chemo, every 2 weeks. 30 tablet 0   levothyroxine (SYNTHROID) 150 MCG tablet Take 1 tablet (150 mcg total) by mouth daily before breakfast. 30 tablet 2   LORazepam (ATIVAN) 0.5 MG tablet Take 1 tablet (0.5 mg total) by mouth 2 (two) times daily as needed for anxiety. 30 tablet 0   magic mouthwash (nystatin, lidocaine, diphenhydrAMINE, alum & mag hydroxide) suspension Swish and swallow 5 mLs 4 (four) times daily as needed for mouth pain. 180 mL 1   metFORMIN (GLUCOPHAGE) 500 MG tablet Take 1 tablet (500 mg total) by mouth 2 (two) times daily with a meal. 60 tablet 2   methadone (DOLOPHINE) 10 MG tablet Take 1 tablet (10 mg total) by mouth every 12 (twelve) hours. 60 tablet 0   ondansetron (ZOFRAN) 8  MG tablet Take 1 tablet (8 mg total) by mouth every 8 (eight) hours as needed for nausea or vomiting. 90 tablet 1   Oxycodone HCl 10 MG TABS Take 1 tablet (10 mg total) by mouth every 4 (four) hours as needed. 90 tablet 0   polyethylene  glycol (MIRALAX / GLYCOLAX) 17 g packet Take 17 g by mouth 2 (two) times daily.     senna-docusate (SENOKOT-S) 8.6-50 MG tablet Take 2 tablets by mouth 2 (two) times daily. 100 tablet 3   No current facility-administered medications for this visit.   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 Pan-endoscopy elsewhere showed cancer from nasopharyngeal space.   01/04/2007 - 02/20/2007 Chemotherapy   He received 2 cycles of cisplatin and 5FU followed by concurrent chemo with weekly cisplatin and radiation. He only received 2 doses of chemo due to severe mucositis, nausea and weight loss.   04/05/2007 - 08/04/2007 Chemotherapy   He received 4 more courses of cisplatin with 5FU and had complete response   07/05/2009 Procedure   Fine-needle aspirate of the right level II lymph nodes come from recurrent metastatic disease. Repeat endoscopy and CT scan show no evidence of disease elsewhere.   07/08/2009 - 12/02/2009 Chemotherapy   He was given 6 cycles of  carboplatin, 5-FU and docetaxel   12/03/2009 Surgery   He has surgery to the residual lymph node on the right neck which showed no evidence of disease.   02/22/2012 Imaging   Repeat imaging study showed large recurrent mass. He was referred elsewhere for further treatment.   05/03/2012 Surgery   He underwent left upper lobectomy.   04/29/2013 Imaging   PEt scan showed lesion on right level II B and lower lung was abnormal   06/03/2013 - 02/02/2014 Chemotherapy   He had 6 cycles of chemotherapy when he was found to have recurrence of cancer and had received oxaliplatin and capecitabine   06/07/2014 Imaging   PET CT scan showed persistent disease in the right neck lymph nodes and left lung   06/29/2014 Procedure   Accession: [id number] repeat LUL biopsy confirmed metastatic cancer   07/18/2014 - 07/31/2014 Radiation Therapy   He received palliative radiation therapy to the lungs   10/10/2014 Imaging   CT scan of the chest, abdomen and pelvis show regression in the size of the lung nodule in the left upper lobe and stable pulmonary nodules   01/24/2015 Imaging   CT scan  showed stable disease in neck and lung   06/19/2015 Imaging   CT scan of the neck and the chest show possible mild progression of the nodule in the right side of the neck.   06/25/2015 Imaging   PET scan confirmed disease recurrence in the neck   07/07/2015 Imaging   [facility]   09/03/2015 - 08/26/2018 Chemotherapy   He received palliative chemo with Nivolumab   10/29/2015 Imaging   PET CT showed positive response to Rx   02/28/2016 Imaging   Ct abdomen showed abnormal thinkening in his stomach   03/03/2016 Imaging   CT: Right sternocleidomastoid muscle metastasis appears less distinct but otherwise not significantly changed in size or configuration since 06/19/2015.2. Left level 3 lymph node which was hypermetabolic by PET-CT in January 2017 appears slightly smaller   04/01/2016 Imaging   CT cervical spine  showed no acute fracture or traumatic malalignment in the cervical spine   04/22/2016 Procedure   Port-a-cath placed.   06/16/2016 Imaging   Ct neck showed right sternocleidomastoid muscle metastasis is further decreased in conspicuity since May, and has mildly decreased in size since September 2016. Continued stability of sub-centimeter left cervical lymph nodes. No new or progressive metastatic disease in the neck.   06/16/2016 Imaging   CT chest showed stable masslike radiation fibrosis in the left upper lobe. Stable subcentimeter pulmonary nodules in the bilateral lower lobes. No new or progressive metastatic disease in the chest. Nonobstructing left renal stone.   10/13/2016 Imaging   Ct neck showed unchanged right sternocleidomastoid muscle metastasis. Unchanged subcentimeter left cervical lymph nodes. No evidence of new or progressive metastatic disease in the neck.   10/13/2016 Imaging   CT chest showed tiny hypervascular foci in the liver, not definitely seen on prior imaging of 06/16/2016 and 02/28/2016. Abdomen MRI without and with contrast recommended to further evaluate as metastatic disease is a concern. 2. Stable appearance of post treatment changes left upper lung and scattered tiny bilateral pulmonary nodules.   02/11/2017 Imaging   Ct neck: Lymph node mass right posterior neck appears improved from the prior study. Small posterior lymph nodes on the left unchanged. Occluded right jugular vein unchanged.   02/11/2017 Imaging   1. Similar appearance of postsurgical and radiation changes in the left upper lobe. 2. Similar bilateral pulmonary nodules. 3. No thoracic adenopathy. 4. Subtle foci of post-contrast enhancement within the liver are suboptimally characterized on this nondedicated study. Likely similar. These could either be re-evaluated at followup or more entirely characterized with abdominal MRI. 5. Left nephrolithiasis.   05/19/2017 Imaging   Matted lymph node mass right  posterior neck appears larger in the recent CT. Accurate measurements difficult due to infiltrating tumor margins and infiltration of the muscle. Right jugular vein again appears occluded or resected. Small left posterior lymph nodes stable. Left upper lobe airspace density stable and similar to the prior CT   06/03/2017 PET scan   1. Hypermetabolic ill-defined right level IIb lymph node, about 1.3 cm in diameter with maximum SUV 9.5 (formerly 8.1). Appearance suspicious for residual/recurrent malignancy. No worrisome left-sided lesion. 2. Left suprahilar indistinct opacity demonstrates no worrisome hypermetabolic activity. The 5 mm left lower lobe pulmonary nodule is stable and not currently hypermetabolic although below sensitive PET-CT size thresholds. 3. Other imaging findings of potential clinical significance: Bilateral nonobstructive nephrolithiasis. Chronic bilateral maxillary sinusitis.   05/11/2018 PET scan   1. Continued chronic accentuated metabolic activity in the vicinity of right level  IIB and the adjacent right sternocleidomastoid muscle, with ill definition of surrounding tissue planes. Maximum SUV is currently 8.1, formerly 9.5. Accentuated metabolic activity is been present in this vicinity back through 06/25/2015, and there was also some low-level activity in this vicinity on 06/07/2014. Some of this may be from scarring and local muscular activity although clearly a component of residual tumor is difficult to exclude given the focally high activity. 2. Other imaging findings of potential clinical significance: Chronic bilateral maxillary sinusitis. Chronic scarring in the left upper lobe. Chronically stable 5 mm left lower lobe nodule is considered benign. Nonobstructive left nephrolithiasis.   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 COMMENT:No significant keratinization is identified. Other basaloid carcinomas are in the differential diagnosis. No cell block material is available for further testing.   09/12/2018 Procedure   He underwent fine Needle Aspiration   10/04/2018 PET scan   1. Significant progression of local recurrence laterally in the mid right neck with an enlarging, increasingly hypermetabolic soft tissue mass. This involves the right sternocleidomastoid muscle. 2. Small lymph nodes in the right axilla are increasingly hypermetabolic. These are nonspecific and potentially reactive, although could reflect a small metastases. Small hypermetabolic nodule in the left suprasternal notch is unchanged. 3. No other evidence of metastatic disease.     10/07/2018 - 12/23/2018 Chemotherapy   The patient had cisplatin plus gemzar   12/07/2018 Imaging   1. Decreased size of lateral right neck mass. 2. Unchanged soft tissue nodule in the suprasternal notch. 3. No evidence of new metastatic disease in the neck.     05/30/2019 Imaging   CT neck No clear change or progression compared to the study of March. Overall measurements of the right lateral neck mass are similar, approximately 3 x 1.8 cm. See above discussion. One could argue that there is slight increase in lateral bulging, possibly with an increase in contrast enhancement, towards the inferior margin. This is of questionable validity but could possibly represent some progression or inflammatory change. Other findings in the region are stable.   07/20/2019 - 09/15/2019 Chemotherapy   The patient had dexamethasone (DECADRON) 4 MG tablet, 1 of 1 cycle, Start date: --, End date: -- palonosetron (ALOXI) injection 0.25 mg, 0.25 mg, Intravenous,  Once, 3 of 4 cycles Administration: 0.25 mg (07/20/2019), 0.25 mg (08/10/2019), 0.25 mg (09/08/2019) CISplatin (PLATINOL) 84 mg in sodium chloride 0.9 % 250 mL chemo infusion, 40 mg/m2 = 84 mg (80 % of original dose 50  mg/m2), Intravenous,  Once, 3 of 4 cycles Dose modification: 40 mg/m2 (80 % of original dose 50 mg/m2, Cycle 1, Reason: Dose Not Tolerated) Administration: 84 mg (07/20/2019), 84 mg (08/10/2019), 83 mg (09/08/2019) gemcitabine (GEMZAR) 1,600 mg in sodium chloride 0.9 % 250 mL chemo infusion, 1,672 mg (80 % of original dose 1,000 mg/m2), Intravenous,  Once, 3 of 4 cycles Dose modification: 800 mg/m2 (80 % of original dose 1,000 mg/m2, Cycle 1, Reason: Provider Judgment) Administration: 1,600 mg (07/20/2019), 1,600 mg (07/27/2019), 1,600 mg (08/10/2019), 1,600 mg (08/17/2019), 1,600 mg (09/08/2019), 1,672 mg (09/15/2019) ondansetron (ZOFRAN) 8 mg, dexamethasone (DECADRON) 10 mg in sodium chloride 0.9 % 50 mL IVPB, , Intravenous,  Once, 3 of 4 cycles Administration:  (09/15/2019) fosaprepitant (EMEND) 150 mg, dexamethasone (DECADRON) 12 mg in sodium chloride 0.9 % 145 mL IVPB, , Intravenous,  Once, 3 of 4  cycles Administration:  (07/20/2019),  (08/10/2019),  (09/08/2019)  for chemotherapy treatment.    10/02/2019 Imaging   CT neck As compared to 05/30/2019, no significant interval change in size of an ill-defined mass within the right lateral neck, again measuring 3.3 x 1.8 cm in transaxial dimensions.   Unchanged mildly enlarged left level I lymph node measuring 1.1 cm in short axis.   Unchanged node or nodule at the thoracic inlet, measuring 1.3 x 0.8 cm.   Please refer to concurrently performed chest CT for a description of findings below the level of the thoracic inlet.     10/02/2019 Imaging   CT chest 1. No new or progressive findings in the chest to suggest metastatic disease. 2. Bilateral subcentimeter solid pulmonary nodules are stable since 2018. 3. Hyperdense 1.1 cm anterior liver focus, not clearly visualized on prior studies. Suggest MRI abdomen without and with IV contrast for further characterization.   10/20/2019 - 12/29/2019 Chemotherapy   The patient had ondansetron (ZOFRAN)  injection 8 mg, 8 mg (100 % of original dose 8 mg), Intravenous,  Once, 2 of 5 cycles Dose modification: 8 mg (original dose 8 mg, Cycle 2) Administration: 8 mg (11/17/2019), 8 mg (12/15/2019), 8 mg (12/29/2019) gemcitabine (GEMZAR) 2,000 mg in sodium chloride 0.9 % 250 mL chemo infusion, 2,090 mg, Intravenous,  Once, 3 of 6 cycles Administration: 2,000 mg (10/20/2019), 2,000 mg (11/03/2019), 2,000 mg (11/17/2019), 2,000 mg (12/15/2019), 2,000 mg (12/29/2019)  for chemotherapy treatment.    06/12/2020 Imaging   1. Enlarging superficial, exophytic component of the chronic right sternocleidomastoid muscle mass. See series 6, image 55. 2. Elsewhere stable CT appearance of the Neck.   06/12/2020 Imaging   Post treatment scarring in the left hemithorax, stable. No evidence recurrent or metastatic disease   06/14/2020 - 10/15/2020 Chemotherapy   He received carboplatin, 5FU and Rande Lawman       10/31/2020 Procedure   Interval improvement in right lateral lymph node mass. Improvement in dermal component as well as invasion of the right sternocleidomastoid muscle.   10 mm submental lymph node slightly enlarged compared to the prior study. Continued follow-up recommended.   11/01/2020 - 05/22/2022 Chemotherapy   Patient is on Treatment Plan : HEAD/NECK Pembrolizumab Q21D     07/03/2022 - 10/30/2022 Chemotherapy   Patient is on Treatment Plan : Head and neck Pembrolizumab (400) q42d     11/30/2022 Imaging   CT chest  1. Stable exam. No new or progressive findings to suggest recurrent or metastatic disease. 2. Stable tiny bilateral nodules since 06/12/2020, consistent with benign etiology. 3. Punctate nonobstructing left renal stone.   12/01/2022 Imaging   CT neck  Growing right neck mass since 2022 with epidural tumor extension via the right C2-3 foramen. Cervical MRI with contrast would be contributory.   12/11/2022 -  Chemotherapy   Patient is on Treatment Plan : HEAD/NECK toripalimab-tpzi D1 +  cisplatin D1 + gemcitabine D1,8 q21d x 6 cycles / toripalimab-tpzi q21d (up to 87m)       PHYSICAL EXAMINATION: ECOG PERFORMANCE STATUS: 1 - Symptomatic but completely ambulatory GENERAL:alert, no distress and comfortable SKIN: skin color, texture, turgor are normal, no rashes or significant lesions EYES: normal, Conjunctiva are pink and non-injected, sclera clear OROPHARYNX:no exudate, no erythema and lips, buccal mucosa, and tongue normal  NECK: supple, thyroid normal size, non-tender, without nodularity LYMPH:  no palpable lymphadenopathy in the cervical, axillary or inguinal LUNGS: clear to auscultation and percussion with normal breathing effort HEART: regular rate &  rhythm and no murmurs and no lower extremity edema ABDOMEN:abdomen soft, non-tender and normal bowel sounds Musculoskeletal:no cyanosis of digits and no clubbing  NEURO: alert & oriented x 3 with fluent speech, no focal motor/sensory deficits  LABORATORY DATA:  I have reviewed the data as listed    Component Value Date/Time   NA 140 01/08/2023 0816   NA 139 09/22/2017 0829   K 3.7 01/08/2023 0816   K 3.5 09/22/2017 0829   CL 102 01/08/2023 0816   CO2 31 01/08/2023 0816   CO2 26 09/22/2017 0829   GLUCOSE 151 (H) 01/08/2023 0816   GLUCOSE 133 09/22/2017 0829   BUN 12 01/08/2023 0816   BUN 14.1 09/22/2017 0829   CREATININE 0.89 01/08/2023 0816   CREATININE 0.9 09/22/2017 0829   CALCIUM 9.3 01/08/2023 0816   CALCIUM 9.1 09/22/2017 0829   PROT 7.0 01/08/2023 0816   PROT 6.8 09/22/2017 0829   ALBUMIN 4.2 01/08/2023 0816   ALBUMIN 4.1 09/22/2017 0829   AST 25 01/08/2023 0816   AST 22 09/22/2017 0829   ALT 34 01/08/2023 0816   ALT 30 09/22/2017 0829   ALKPHOS 73 01/08/2023 0816   ALKPHOS 55 09/22/2017 0829   BILITOT 0.2 (L) 01/08/2023 0816   BILITOT 0.35 09/22/2017 0829   GFRNONAA >60 01/08/2023 0816   GFRAA >60 07/05/2020 0845   GFRAA >60 02/06/2019 1215    No results found for: "SPEP", "UPEP"  Lab  Results  Component Value Date   WBC 5.4 01/08/2023   NEUTROABS 2.7 01/08/2023   HGB 11.2 (L) 01/08/2023   HCT 33.5 (L) 01/08/2023   MCV 86.8 01/08/2023   PLT 192 01/08/2023      Chemistry      Component Value Date/Time   NA 140 01/08/2023 0816   NA 139 09/22/2017 0829   K 3.7 01/08/2023 0816   K 3.5 09/22/2017 0829   CL 102 01/08/2023 0816   CO2 31 01/08/2023 0816   CO2 26 09/22/2017 0829   BUN 12 01/08/2023 0816   BUN 14.1 09/22/2017 0829   CREATININE 0.89 01/08/2023 0816   CREATININE 0.9 09/22/2017 0829      Component Value Date/Time   CALCIUM 9.3 01/08/2023 0816   CALCIUM 9.1 09/22/2017 0829   ALKPHOS 73 01/08/2023 0816   ALKPHOS 55 09/22/2017 0829   AST 25 01/08/2023 0816   AST 22 09/22/2017 0829   ALT 34 01/08/2023 0816   ALT 30 09/22/2017 0829   BILITOT 0.2 (L) 01/08/2023 0816   BILITOT 0.35 09/22/2017 0829

## 2023-01-08 NOTE — Patient Instructions (Addendum)
[facility]  Discharge Instructions: Thank you for choosing [facility] to provide your oncology and hematology care.   If you have a lab appointment with the Cancer Center, please go directly to the Cancer Center and check in at the registration area.   Wear comfortable clothing and clothing appropriate for easy access to any Portacath or PICC line.   We strive to give you quality time with your provider. You may need to reschedule your appointment if you arrive late (15 or more minutes).  Arriving late affects you and other patients whose appointments are after yours.  Also, if you miss three or more appointments without notifying the office, you may be dismissed from the clinic at the provider's discretion.      For prescription refill requests, have your pharmacy contact our office and allow 72 hours for refills to be completed.    Today you received the following chemotherapy and/or immunotherapy agents: toripalimab, gemcitabine, and cisplatin      To help prevent nausea and vomiting after your treatment, we encourage you to take your nausea medication as directed.  BELOW ARE SYMPTOMS THAT SHOULD BE REPORTED IMMEDIATELY: *FEVER GREATER THAN 100.4 F (38 C) OR HIGHER *CHILLS OR SWEATING *NAUSEA AND VOMITING THAT IS NOT CONTROLLED WITH YOUR NAUSEA MEDICATION *UNUSUAL SHORTNESS OF BREATH *UNUSUAL BRUISING OR BLEEDING *URINARY PROBLEMS (pain or burning when urinating, or frequent urination) *BOWEL PROBLEMS (unusual diarrhea, constipation, pain near the anus) TENDERNESS IN MOUTH AND THROAT WITH OR WITHOUT PRESENCE OF ULCERS (sore throat, sores in mouth, or a toothache) UNUSUAL RASH, SWELLING OR PAIN  UNUSUAL VAGINAL DISCHARGE OR ITCHING   Items with * indicate a potential emergency and should be followed up as soon as possible or go to the Emergency Department if any problems should occur.  Please show the CHEMOTHERAPY ALERT CARD or  IMMUNOTHERAPY ALERT CARD at check-in to the Emergency Department and triage nurse.  Should you have questions after your visit or need to cancel or reschedule your appointment, please contact [facility]  Dept: [phone]  and follow the prompts.  Office hours are 8:00 a.m. to 4:30 p.m. Monday - Friday. Please note that voicemails left after 4:00 p.m. may not be returned until the following business day.  We are closed weekends and major holidays. You have access to a nurse at all times for urgent questions. Please call the main number to the clinic Dept: [phone] and follow the prompts.   For any non-urgent questions, you may also contact your provider using MyChart. We now offer e-Visits for anyone 68 and older to request care online for non-urgent symptoms. For details visit [email or web address].   Also download the MyChart app! Go to the app store, search "MyChart", open the app, select , and log in with your MyChart username and password.

## 2023-01-08 NOTE — Assessment & Plan Note (Signed)
This is due to side effects of treatment I recommend Magic mouthwash We will reduce the dose of treatment as above

## 2023-01-08 NOTE — Assessment & Plan Note (Signed)
He felt better and started to have significant nausea and dehydration I recommend reducing the dose of cisplatin especially with ongoing mucositis We will provide additional supportive care with IV fluids tomorrow as well as next week I recommend repeat imaging study after 3 months of treatment

## 2023-01-09 ENCOUNTER — Ambulatory Visit: Payer: Medicaid Other

## 2023-01-09 ENCOUNTER — Inpatient Hospital Stay: Payer: Medicaid Other

## 2023-01-09 VITALS — BP 141/88 | HR 88 | Temp 98.1°F | Resp 18

## 2023-01-09 DIAGNOSIS — Z5111 Encounter for antineoplastic chemotherapy: Secondary | ICD-10-CM | POA: Diagnosis not present

## 2023-01-09 DIAGNOSIS — C7800 Secondary malignant neoplasm of unspecified lung: Secondary | ICD-10-CM

## 2023-01-09 DIAGNOSIS — C760 Malignant neoplasm of head, face and neck: Secondary | ICD-10-CM

## 2023-01-09 DIAGNOSIS — Z95828 Presence of other vascular implants and grafts: Secondary | ICD-10-CM

## 2023-01-09 MED ORDER — SODIUM CHLORIDE 0.9 % IV SOLN: Freq: Once | INTRAVENOUS | Status: AC

## 2023-01-09 MED ORDER — SODIUM CHLORIDE 0.9% FLUSH
10.0000 mL | Freq: Once | INTRAVENOUS | Status: AC | PRN
  Administered 2023-01-09: 10 mL

## 2023-01-09 MED ORDER — ONDANSETRON HCL 4 MG/2ML IJ SOLN
8.0000 mg | Freq: Once | INTRAMUSCULAR | Status: AC
  Administered 2023-01-09: 8 mg via INTRAVENOUS
  Filled 2023-01-09: qty 4

## 2023-01-09 NOTE — Patient Instructions (Signed)

## 2023-01-11 ENCOUNTER — Other Ambulatory Visit: Payer: Self-pay

## 2023-01-11 ENCOUNTER — Inpatient Hospital Stay: Payer: Medicaid Other

## 2023-01-11 VITALS — BP 131/91 | HR 71 | Temp 97.7°F | Resp 18

## 2023-01-11 DIAGNOSIS — Z5111 Encounter for antineoplastic chemotherapy: Secondary | ICD-10-CM | POA: Diagnosis not present

## 2023-01-11 DIAGNOSIS — C7800 Secondary malignant neoplasm of unspecified lung: Secondary | ICD-10-CM

## 2023-01-11 DIAGNOSIS — C760 Malignant neoplasm of head, face and neck: Secondary | ICD-10-CM

## 2023-01-11 DIAGNOSIS — Z95828 Presence of other vascular implants and grafts: Secondary | ICD-10-CM

## 2023-01-11 MED ORDER — SODIUM CHLORIDE 0.9 % IV SOLN: Freq: Once | INTRAVENOUS | Status: AC

## 2023-01-11 MED ORDER — SODIUM CHLORIDE 0.9% FLUSH
10.0000 mL | Freq: Once | INTRAVENOUS | Status: AC | PRN
  Administered 2023-01-11: 10 mL

## 2023-01-11 MED ORDER — ONDANSETRON HCL 4 MG/2ML IJ SOLN
8.0000 mg | Freq: Once | INTRAMUSCULAR | Status: AC
  Administered 2023-01-11: 8 mg via INTRAVENOUS
  Filled 2023-01-11: qty 4

## 2023-01-11 NOTE — Patient Instructions (Signed)

## 2023-01-11 NOTE — Progress Notes (Signed)
Patient request to leave early, stating "feels a panic attack coming on". Per patient, this intermittently happens. He received approximately NS. PAC flushed, blood return noted, locked with NS and deaccessed. Pt ambulated independently to lobby.

## 2023-01-15 ENCOUNTER — Other Ambulatory Visit (HOSPITAL_BASED_OUTPATIENT_CLINIC_OR_DEPARTMENT_OTHER): Payer: Self-pay

## 2023-01-22 ENCOUNTER — Inpatient Hospital Stay (HOSPITAL_BASED_OUTPATIENT_CLINIC_OR_DEPARTMENT_OTHER): Payer: Medicaid Other | Admitting: Hematology and Oncology

## 2023-01-22 ENCOUNTER — Other Ambulatory Visit: Payer: Self-pay

## 2023-01-22 ENCOUNTER — Inpatient Hospital Stay: Payer: Medicaid Other

## 2023-01-22 ENCOUNTER — Encounter: Payer: Self-pay | Admitting: Hematology and Oncology

## 2023-01-22 VITALS — BP 133/89 | HR 85 | Temp 97.8°F | Resp 17 | Wt 177.5 lb

## 2023-01-22 DIAGNOSIS — C760 Malignant neoplasm of head, face and neck: Secondary | ICD-10-CM | POA: Diagnosis not present

## 2023-01-22 DIAGNOSIS — C7800 Secondary malignant neoplasm of unspecified lung: Secondary | ICD-10-CM

## 2023-01-22 DIAGNOSIS — Z7189 Other specified counseling: Secondary | ICD-10-CM

## 2023-01-22 DIAGNOSIS — Z95828 Presence of other vascular implants and grafts: Secondary | ICD-10-CM

## 2023-01-22 DIAGNOSIS — E039 Hypothyroidism, unspecified: Secondary | ICD-10-CM

## 2023-01-22 DIAGNOSIS — Z5111 Encounter for antineoplastic chemotherapy: Secondary | ICD-10-CM | POA: Diagnosis not present

## 2023-01-22 DIAGNOSIS — R197 Diarrhea, unspecified: Secondary | ICD-10-CM

## 2023-01-22 LAB — CBC WITH DIFFERENTIAL (CANCER CENTER ONLY)
Abs Immature Granulocytes: 0.02 10*3/uL (ref 0.00–0.07)
Basophils Absolute: 0 10*3/uL (ref 0.0–0.1)
Basophils Relative: 1 %
Eosinophils Absolute: 0.2 10*3/uL (ref 0.0–0.5)
Eosinophils Relative: 4 %
HCT: 37.2 % — ABNORMAL LOW (ref 39.0–52.0)
Hemoglobin: 12.3 g/dL — ABNORMAL LOW (ref 13.0–17.0)
Immature Granulocytes: 1 %
Lymphocytes Relative: 32 %
Lymphs Abs: 1.3 10*3/uL (ref 0.7–4.0)
MCH: 28.7 pg (ref 26.0–34.0)
MCHC: 33.1 g/dL (ref 30.0–36.0)
MCV: 86.7 fL (ref 80.0–100.0)
Monocytes Absolute: 0.6 10*3/uL (ref 0.1–1.0)
Monocytes Relative: 15 %
Neutro Abs: 2 10*3/uL (ref 1.7–7.7)
Neutrophils Relative %: 47 %
Platelet Count: 364 10*3/uL (ref 150–400)
RBC: 4.29 MIL/uL (ref 4.22–5.81)
RDW: 15.6 % — ABNORMAL HIGH (ref 11.5–15.5)
Smear Review: NORMAL
WBC Count: 4.2 10*3/uL (ref 4.0–10.5)
nRBC: 0 % (ref 0.0–0.2)

## 2023-01-22 LAB — CMP (CANCER CENTER ONLY)
ALT: 28 U/L (ref 0–44)
AST: 24 U/L (ref 15–41)
Albumin: 4.7 g/dL (ref 3.5–5.0)
Alkaline Phosphatase: 82 U/L (ref 38–126)
Anion gap: 8 (ref 5–15)
BUN: 15 mg/dL (ref 6–20)
CO2: 30 mmol/L (ref 22–32)
Calcium: 9.8 mg/dL (ref 8.9–10.3)
Chloride: 101 mmol/L (ref 98–111)
Creatinine: 1 mg/dL (ref 0.61–1.24)
GFR, Estimated: >60 mL/min (ref >60)
Glucose, Bld: 156 mg/dL — ABNORMAL HIGH (ref 70–99)
Potassium: 3.6 mmol/L (ref 3.5–5.1)
Sodium: 139 mmol/L (ref 135–145)
Total Bilirubin: 0.3 mg/dL (ref 0.3–1.2)
Total Protein: 7.9 g/dL (ref 6.5–8.1)

## 2023-01-22 LAB — MAGNESIUM: Magnesium: 1.6 mg/dL — ABNORMAL LOW (ref 1.7–2.4)

## 2023-01-22 LAB — T4, FREE: Free T4: 0.8 ng/dL (ref 0.61–1.12)

## 2023-01-22 LAB — TSH: TSH: 4.59 u[IU]/mL — ABNORMAL HIGH (ref 0.350–4.500)

## 2023-01-22 MED ORDER — ONDANSETRON HCL 8 MG PO TABS
8.0000 mg | ORAL_TABLET | Freq: Once | ORAL | Status: AC
  Administered 2023-01-22: 8 mg via ORAL
  Filled 2023-01-22: qty 1

## 2023-01-22 MED ORDER — SODIUM CHLORIDE 0.9% FLUSH
10.0000 mL | INTRAVENOUS | Status: DC | PRN
  Administered 2023-01-22: 10 mL

## 2023-01-22 MED ORDER — SODIUM CHLORIDE 0.9 % IV SOLN: Freq: Once | INTRAVENOUS | Status: AC

## 2023-01-22 MED ORDER — ALTEPLASE 2 MG IJ SOLR
2.0000 mg | Freq: Once | INTRAMUSCULAR | Status: AC
  Administered 2023-01-22: 2 mg
  Filled 2023-01-22: qty 2

## 2023-01-22 MED ORDER — SODIUM CHLORIDE 0.9% FLUSH
10.0000 mL | Freq: Once | INTRAVENOUS | Status: AC
  Administered 2023-01-22: 10 mL

## 2023-01-22 MED ORDER — SODIUM CHLORIDE 0.9 % IV SOLN
750.0000 mg/m2 | Freq: Once | INTRAVENOUS | Status: AC
  Administered 2023-01-22: 1520 mg via INTRAVENOUS
  Filled 2023-01-22: qty 39.98

## 2023-01-22 NOTE — Progress Notes (Signed)
Completed FMLA Forms for Spouse (Nimo) as requested by Patient. Fax transmission confirmation received. Copy of forms picked up by Patient. No other needs or concerns voiced at this time.

## 2023-01-22 NOTE — Progress Notes (Signed)
Per verbal Dr Bertis Ruddy, ok to proceed with treatment with ANC pending.

## 2023-01-22 NOTE — Progress Notes (Signed)
Pt. Here for port flush and labs.  Port accessed and flushed slowly with normal saline with no blood return.  Dr. Bertis Ruddy made aware.  States pt. To have cath flo and send to lab for blood draw.  Cath flo administer per Judy/RN and pt. Sent to lab

## 2023-01-22 NOTE — Assessment & Plan Note (Signed)
He tolerated recent treatment well He has no pain We will proceed with treatment as scheduled We will provide additional supportive care with IV fluids  I recommend repeat imaging study after 3 months of treatment

## 2023-01-22 NOTE — Assessment & Plan Note (Signed)
Not related to treatment Recommend Imodium as needed

## 2023-01-22 NOTE — Progress Notes (Signed)
Per Dr Bertis Ruddy, ok to proceed with treatment with low mag level today

## 2023-01-22 NOTE — Progress Notes (Signed)
[facility] OFFICE PROGRESS NOTE  Patient Care Team: Artis Delay, MD as PCP - General (Hematology and Oncology) Artis Delay, MD as Consulting Physician (Hematology and Oncology) Elfredia Nevins, MD as Referring Physician (Plastic Surgery) Hilarie Fredrickson, MD as Consulting Physician (Gastroenterology)  ASSESSMENT & PLAN:  Malignant neoplasm of head and neck [facility]) He tolerated recent treatment well He has no pain We will proceed with treatment as scheduled We will provide additional supportive care with IV fluids  I recommend repeat imaging study after 3 months of treatment  Hypomagnesemia Due to recent diarrhea Observe closely  Diarrhea Not related to treatment Recommend Imodium as needed  No orders of the defined types were placed in this encounter.   All questions were answered. The patient knows to call the clinic with any problems, questions or concerns. The total time spent in the appointment was 25 minutes encounter with patients including review of chart and various tests results, discussions about plan of care and coordination of care plan   Artis Delay, MD 01/22/2023 11:42 AM  INTERVAL HISTORY: Please see below for problem oriented charting. he returns for treatment follow-up seen in the infusion room He had recent diarrhea, resolving spontaneously He has no pain We discussed dental visit that the patient needed He has no other concerns today  REVIEW OF SYSTEMS:   Constitutional: Denies fevers, chills or abnormal weight loss Eyes: Denies blurriness of vision Ears, nose, mouth, throat, and face: Denies mucositis or sore throat Respiratory: Denies cough, dyspnea or wheezes Cardiovascular: Denies palpitation, chest discomfort or lower extremity swelling Skin: Denies abnormal skin rashes Lymphatics: Denies new lymphadenopathy or easy bruising Neurological:Denies numbness, tingling or new weaknesses Behavioral/Psych: Mood is stable, no new changes  All  other systems were reviewed with the patient and are negative.  I have reviewed the past medical history, past surgical history, social history and family history with the patient and they are unchanged from previous note.  ALLERGIES:  is allergic to phenergan [promethazine hcl], heparin, and clindamycin.  MEDICATIONS:  Current Outpatient Medications  Medication Sig Dispense Refill   blood glucose meter kit and supplies KIT Dispense based on patient and insurance preference. Use up to four times daily as directed. (FOR ICD-9 250.00, 250.01). 1 each 1   cetirizine (ZYRTEC) 10 MG tablet Take 1 tablet (10 mg total) by mouth daily as needed for allergies. 100 tablet 0   dexamethasone (DECADRON) 4 MG tablet Take 1 tablet (4 mg total) by mouth daily for 3 days after chemo, every 2 weeks. 30 tablet 0   levothyroxine (SYNTHROID) 150 MCG tablet Take 1 tablet (150 mcg total) by mouth daily before breakfast. 30 tablet 2   LORazepam (ATIVAN) 0.5 MG tablet Take 1 tablet (0.5 mg total) by mouth 2 (two) times daily as needed for anxiety. 30 tablet 0   magic mouthwash (nystatin, lidocaine, diphenhydrAMINE, alum & mag hydroxide) suspension Swish and swallow 5 mLs 4 (four) times daily as needed for mouth pain. 180 mL 1   metFORMIN (GLUCOPHAGE) 500 MG tablet Take 1 tablet (500 mg total) by mouth 2 (two) times daily with a meal. 60 tablet 2   methadone (DOLOPHINE) 10 MG tablet Take 1 tablet (10 mg total) by mouth every 12 (twelve) hours. 60 tablet 0   ondansetron (ZOFRAN) 8 MG tablet Take 1 tablet (8 mg total) by mouth every 8 (eight) hours as needed for nausea or vomiting. 90 tablet 1   Oxycodone HCl 10 MG TABS Take 1  tablet (10 mg total) by mouth every 4 (four) hours as needed. 90 tablet 0   polyethylene glycol (MIRALAX / GLYCOLAX) 17 g packet Take 17 g by mouth 2 (two) times daily.     senna-docusate (SENOKOT-S) 8.6-50 MG tablet Take 2 tablets by mouth 2 (two) times daily. 100 tablet 3   No current  facility-administered medications for this visit.   Facility-Administered Medications Ordered in Other Visits  Medication Dose Route Frequency Provider Last Rate Last Admin   anticoagulant sodium citrate solution 5 mL  5 mL Intracatheter Once Bertis Ruddy, Suede Greenawalt, MD       anticoagulant sodium citrate solution 5 mL  5 mL Intracatheter Once Bertis Ruddy, Torrey Horseman, MD       gemcitabine (GEMZAR) 1,520 mg in sodium chloride 0.9 % 250 mL chemo infusion  750 mg/m2 (Treatment Plan Recorded) Intravenous Once Artis Delay, MD 580 mL/hr at 01/22/23 1124 1,520 mg at 01/22/23 1124    SUMMARY OF ONCOLOGIC HISTORY: Oncology History Overview Note  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 Pan-endoscopy elsewhere showed cancer from nasopharyngeal space.   01/04/2007 - 02/20/2007 Chemotherapy   He received 2 cycles of cisplatin and 5FU followed by concurrent chemo with weekly cisplatin and radiation. He only received 2 doses of chemo due to severe mucositis, nausea and weight loss.   04/05/2007 - 08/04/2007 Chemotherapy   He received 4 more courses of cisplatin with 5FU and had complete response   07/05/2009 Procedure   Fine-needle aspirate of the right level II lymph nodes come from recurrent metastatic disease. Repeat endoscopy and CT scan show no evidence of disease elsewhere.   07/08/2009 - 12/02/2009 Chemotherapy   He was given 6 cycles of carboplatin, 5-FU and docetaxel   12/03/2009 Surgery   He has surgery to the residual lymph node on the right neck which showed  no evidence of disease.   02/22/2012 Imaging   Repeat imaging study showed large recurrent mass. He was referred elsewhere for further treatment.   05/03/2012 Surgery   He underwent left upper lobectomy.   04/29/2013 Imaging   PEt scan showed lesion on right level II B and lower lung was abnormal   06/03/2013 - 02/02/2014 Chemotherapy   He had 6 cycles of chemotherapy when he was found to have recurrence of cancer and had received oxaliplatin and capecitabine   06/07/2014 Imaging   PET CT scan showed persistent disease in the right neck lymph nodes and left lung   06/29/2014 Procedure   Accession: [id number] repeat LUL biopsy confirmed metastatic cancer   07/18/2014 - 07/31/2014 Radiation Therapy   He received palliative radiation therapy to the lungs   10/10/2014 Imaging   CT scan of the chest, abdomen and pelvis show regression in the size of the lung nodule in the left upper lobe and stable pulmonary nodules   01/24/2015 Imaging   CT scan showed stable disease in neck and lung   06/19/2015 Imaging   CT scan of the neck and the chest show  possible mild progression of the nodule in the right side of the neck.   06/25/2015 Imaging   PET scan confirmed disease recurrence in the neck   07/07/2015 Imaging   He had MRI neck at [facility]   09/03/2015 - 08/26/2018 Chemotherapy   He received palliative chemo with Nivolumab   10/29/2015 Imaging   PET CT showed positive response to Rx   02/28/2016 Imaging   Ct abdomen showed abnormal thinkening in his stomach   03/03/2016 Imaging   CT: Right sternocleidomastoid muscle metastasis appears less distinct but otherwise not significantly changed in size or configuration since 06/19/2015.2. Left level 3 lymph node which was hypermetabolic by PET-CT in January 2017 appears slightly smaller   04/01/2016 Imaging   CT cervical spine showed no acute fracture or traumatic malalignment in the cervical spine   04/22/2016 Procedure   Port-a-cath placed.    06/16/2016 Imaging   Ct neck showed right sternocleidomastoid muscle metastasis is further decreased in conspicuity since May, and has mildly decreased in size since September 2016. Continued stability of sub-centimeter left cervical lymph nodes. No new or progressive metastatic disease in the neck.   06/16/2016 Imaging   CT chest showed stable masslike radiation fibrosis in the left upper lobe. Stable subcentimeter pulmonary nodules in the bilateral lower lobes. No new or progressive metastatic disease in the chest. Nonobstructing left renal stone.   10/13/2016 Imaging   Ct neck showed unchanged right sternocleidomastoid muscle metastasis. Unchanged subcentimeter left cervical lymph nodes. No evidence of new or progressive metastatic disease in the neck.   10/13/2016 Imaging   CT chest showed tiny hypervascular foci in the liver, not definitely seen on prior imaging of 06/16/2016 and 02/28/2016. Abdomen MRI without and with contrast recommended to further evaluate as metastatic disease is a concern. 2. Stable appearance of post treatment changes left upper lung and scattered tiny bilateral pulmonary nodules.   02/11/2017 Imaging   Ct neck: Lymph node mass right posterior neck appears improved from the prior study. Small posterior lymph nodes on the left unchanged. Occluded right jugular vein unchanged.   02/11/2017 Imaging   1. Similar appearance of postsurgical and radiation changes in the left upper lobe. 2. Similar bilateral pulmonary nodules. 3. No thoracic adenopathy. 4. Subtle foci of post-contrast enhancement within the liver are suboptimally characterized on this nondedicated study. Likely similar. These could either be re-evaluated at followup or more entirely characterized with abdominal MRI. 5. Left nephrolithiasis.   05/19/2017 Imaging   Matted lymph node mass right posterior neck appears larger in the recent CT. Accurate measurements difficult due to infiltrating tumor margins and  infiltration of the muscle. Right jugular vein again appears occluded or resected. Small left posterior lymph nodes stable. Left upper lobe airspace density stable and similar to the prior CT   06/03/2017 PET scan   1. Hypermetabolic ill-defined right level IIb lymph node, about 1.3 cm in diameter with maximum SUV 9.5 (formerly 8.1). Appearance suspicious for residual/recurrent malignancy. No worrisome left-sided lesion. 2. Left suprahilar indistinct opacity demonstrates no worrisome hypermetabolic activity. The 5 mm left lower lobe pulmonary nodule is stable and not currently hypermetabolic although below sensitive PET-CT size thresholds. 3. Other imaging findings of potential clinical significance: Bilateral nonobstructive nephrolithiasis. Chronic bilateral maxillary sinusitis.   05/11/2018 PET scan   1. Continued chronic accentuated metabolic activity in the vicinity of right level IIB and the adjacent right sternocleidomastoid muscle, with ill definition of surrounding tissue planes. Maximum SUV is currently 8.1, formerly 9.5. Accentuated  metabolic activity is been present in this vicinity back through 06/25/2015, and there was also some low-level activity in this vicinity on 06/07/2014. Some of this may be from scarring and local muscular activity although clearly a component of residual tumor is difficult to exclude given the focally high activity. 2. Other imaging findings of potential clinical significance: Chronic bilateral maxillary sinusitis. Chronic scarring in the left upper lobe. Chronically stable 5 mm left lower lobe nodule is considered benign. Nonobstructive left nephrolithiasis.   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 COMMENT:No significant keratinization is identified. Other basaloid carcinomas are in the differential diagnosis. No  cell block material is available for further testing.   09/12/2018 Procedure   He underwent fine Needle Aspiration   10/04/2018 PET scan   1. Significant progression of local recurrence laterally in the mid right neck with an enlarging, increasingly hypermetabolic soft tissue mass. This involves the right sternocleidomastoid muscle. 2. Small lymph nodes in the right axilla are increasingly hypermetabolic. These are nonspecific and potentially reactive, although could reflect a small metastases. Small hypermetabolic nodule in the left suprasternal notch is unchanged. 3. No other evidence of metastatic disease.     10/07/2018 - 12/23/2018 Chemotherapy   The patient had cisplatin plus gemzar   12/07/2018 Imaging   1. Decreased size of lateral right neck mass. 2. Unchanged soft tissue nodule in the suprasternal notch. 3. No evidence of new metastatic disease in the neck.     05/30/2019 Imaging   CT neck No clear change or progression compared to the study of March. Overall measurements of the right lateral neck mass are similar, approximately 3 x 1.8 cm. See above discussion. One could argue that there is slight increase in lateral bulging, possibly with an increase in contrast enhancement, towards the inferior margin. This is of questionable validity but could possibly represent some progression or inflammatory change. Other findings in the region are stable.   07/20/2019 - 09/15/2019 Chemotherapy   The patient had dexamethasone (DECADRON) 4 MG tablet, 1 of 1 cycle, Start date: --, End date: -- palonosetron (ALOXI) injection 0.25 mg, 0.25 mg, Intravenous,  Once, 3 of 4 cycles Administration: 0.25 mg (07/20/2019), 0.25 mg (08/10/2019), 0.25 mg (09/08/2019) CISplatin (PLATINOL) 84 mg in sodium chloride 0.9 % 250 mL chemo infusion, 40 mg/m2 = 84 mg (80 % of original dose 50 mg/m2), Intravenous,  Once, 3 of 4 cycles Dose modification: 40 mg/m2 (80 % of original dose 50 mg/m2, Cycle 1, Reason: Dose Not  Tolerated) Administration: 84 mg (07/20/2019), 84 mg (08/10/2019), 83 mg (09/08/2019) gemcitabine (GEMZAR) 1,600 mg in sodium chloride 0.9 % 250 mL chemo infusion, 1,672 mg (80 % of original dose 1,000 mg/m2), Intravenous,  Once, 3 of 4 cycles Dose modification: 800 mg/m2 (80 % of original dose 1,000 mg/m2, Cycle 1, Reason: Provider Judgment) Administration: 1,600 mg (07/20/2019), 1,600 mg (07/27/2019), 1,600 mg (08/10/2019), 1,600 mg (08/17/2019), 1,600 mg (09/08/2019), 1,672 mg (09/15/2019) ondansetron (ZOFRAN) 8 mg, dexamethasone (DECADRON) 10 mg in sodium chloride 0.9 % 50 mL IVPB, , Intravenous,  Once, 3 of 4 cycles Administration:  (09/15/2019) fosaprepitant (EMEND) 150 mg, dexamethasone (DECADRON) 12 mg in sodium chloride 0.9 % 145 mL IVPB, , Intravenous,  Once, 3 of 4 cycles Administration:  (07/20/2019),  (08/10/2019),  (09/08/2019)  for chemotherapy treatment.    10/02/2019 Imaging   CT neck As  compared to 05/30/2019, no significant interval change in size of an ill-defined mass within the right lateral neck, again measuring 3.3 x 1.8 cm in transaxial dimensions.   Unchanged mildly enlarged left level I lymph node measuring 1.1 cm in short axis.   Unchanged node or nodule at the thoracic inlet, measuring 1.3 x 0.8 cm.   Please refer to concurrently performed chest CT for a description of findings below the level of the thoracic inlet.     10/02/2019 Imaging   CT chest 1. No new or progressive findings in the chest to suggest metastatic disease. 2. Bilateral subcentimeter solid pulmonary nodules are stable since 2018. 3. Hyperdense 1.1 cm anterior liver focus, not clearly visualized on prior studies. Suggest MRI abdomen without and with IV contrast for further characterization.   10/20/2019 - 12/29/2019 Chemotherapy   The patient had ondansetron (ZOFRAN) injection 8 mg, 8 mg (100 % of original dose 8 mg), Intravenous,  Once, 2 of 5 cycles Dose modification: 8 mg (original dose 8 mg,  Cycle 2) Administration: 8 mg (11/17/2019), 8 mg (12/15/2019), 8 mg (12/29/2019) gemcitabine (GEMZAR) 2,000 mg in sodium chloride 0.9 % 250 mL chemo infusion, 2,090 mg, Intravenous,  Once, 3 of 6 cycles Administration: 2,000 mg (10/20/2019), 2,000 mg (11/03/2019), 2,000 mg (11/17/2019), 2,000 mg (12/15/2019), 2,000 mg (12/29/2019)  for chemotherapy treatment.    06/12/2020 Imaging   1. Enlarging superficial, exophytic component of the chronic right sternocleidomastoid muscle mass. See series 6, image 55. 2. Elsewhere stable CT appearance of the Neck.   06/12/2020 Imaging   Post treatment scarring in the left hemithorax, stable. No evidence recurrent or metastatic disease   06/14/2020 - 10/15/2020 Chemotherapy   He received carboplatin, 5FU and Rande Lawman       10/31/2020 Procedure   Interval improvement in right lateral lymph node mass. Improvement in dermal component as well as invasion of the right sternocleidomastoid muscle.   10 mm submental lymph node slightly enlarged compared to the prior study. Continued follow-up recommended.   11/01/2020 - 05/22/2022 Chemotherapy   Patient is on Treatment Plan : HEAD/NECK Pembrolizumab Q21D     07/03/2022 - 10/30/2022 Chemotherapy   Patient is on Treatment Plan : Head and neck Pembrolizumab (400) q42d     11/30/2022 Imaging   CT chest  1. Stable exam. No new or progressive findings to suggest recurrent or metastatic disease. 2. Stable tiny bilateral nodules since 06/12/2020, consistent with benign etiology. 3. Punctate nonobstructing left renal stone.   12/01/2022 Imaging   CT neck  Growing right neck mass since 2022 with epidural tumor extension via the right C2-3 foramen. Cervical MRI with contrast would be contributory.   12/11/2022 -  Chemotherapy   Patient is on Treatment Plan : HEAD/NECK toripalimab-tpzi D1 + cisplatin D1 + gemcitabine D1,8 q21d x 6 cycles / toripalimab-tpzi q21d (up to 59m)       PHYSICAL EXAMINATION: ECOG PERFORMANCE  STATUS: 1 - Symptomatic but completely ambulatory  GENERAL:alert, no distress and comfortable NEURO: alert & oriented x 3 with fluent speech, no focal motor/sensory deficits  LABORATORY DATA:  I have reviewed the data as listed    Component Value Date/Time   NA 139 01/22/2023 0935   NA 139 09/22/2017 0829   K 3.6 01/22/2023 0935   K 3.5 09/22/2017 0829   CL 101 01/22/2023 0935   CO2 30 01/22/2023 0935   CO2 26 09/22/2017 0829   GLUCOSE 156 (H) 01/22/2023 0935   GLUCOSE 133 09/22/2017 0829  BUN 15 01/22/2023 0935   BUN 14.1 09/22/2017 0829   CREATININE 1.00 01/22/2023 0935   CREATININE 0.9 09/22/2017 0829   CALCIUM 9.8 01/22/2023 0935   CALCIUM 9.1 09/22/2017 0829   PROT 7.9 01/22/2023 0935   PROT 6.8 09/22/2017 0829   ALBUMIN 4.7 01/22/2023 0935   ALBUMIN 4.1 09/22/2017 0829   AST 24 01/22/2023 0935   AST 22 09/22/2017 0829   ALT 28 01/22/2023 0935   ALT 30 09/22/2017 0829   ALKPHOS 82 01/22/2023 0935   ALKPHOS 55 09/22/2017 0829   BILITOT 0.3 01/22/2023 0935   BILITOT 0.35 09/22/2017 0829   GFRNONAA >60 01/22/2023 0935   GFRAA >60 07/05/2020 0845   GFRAA >60 02/06/2019 1215    No results found for: "SPEP", "UPEP"  Lab Results  Component Value Date   WBC 4.2 01/22/2023   NEUTROABS 2.0 01/22/2023   HGB 12.3 (L) 01/22/2023   HCT 37.2 (L) 01/22/2023   MCV 86.7 01/22/2023   PLT 364 01/22/2023      Chemistry      Component Value Date/Time   NA 139 01/22/2023 0935   NA 139 09/22/2017 0829   K 3.6 01/22/2023 0935   K 3.5 09/22/2017 0829   CL 101 01/22/2023 0935   CO2 30 01/22/2023 0935   CO2 26 09/22/2017 0829   BUN 15 01/22/2023 0935   BUN 14.1 09/22/2017 0829   CREATININE 1.00 01/22/2023 0935   CREATININE 0.9 09/22/2017 0829      Component Value Date/Time   CALCIUM 9.8 01/22/2023 0935   CALCIUM 9.1 09/22/2017 0829   ALKPHOS 82 01/22/2023 0935   ALKPHOS 55 09/22/2017 0829   AST 24 01/22/2023 0935   AST 22 09/22/2017 0829   ALT 28 01/22/2023 0935    ALT 30 09/22/2017 0829   BILITOT 0.3 01/22/2023 0935   BILITOT 0.35 09/22/2017 1610

## 2023-01-22 NOTE — Assessment & Plan Note (Signed)
Due to recent diarrhea Observe closely

## 2023-01-27 ENCOUNTER — Telehealth: Payer: Self-pay

## 2023-01-27 ENCOUNTER — Other Ambulatory Visit: Payer: Self-pay | Admitting: Physician Assistant

## 2023-01-27 ENCOUNTER — Other Ambulatory Visit: Payer: Self-pay

## 2023-01-27 ENCOUNTER — Other Ambulatory Visit (HOSPITAL_BASED_OUTPATIENT_CLINIC_OR_DEPARTMENT_OTHER): Payer: Self-pay

## 2023-01-27 DIAGNOSIS — C760 Malignant neoplasm of head, face and neck: Secondary | ICD-10-CM

## 2023-01-27 MED ORDER — METHADONE HCL 10 MG PO TABS
10.0000 mg | ORAL_TABLET | Freq: Two times a day (BID) | ORAL | 0 refills | Status: DC
Start: 2023-01-27 — End: 2023-02-25
  Filled 2023-01-27 – 2023-01-29 (×3): qty 60, 30d supply, fill #0

## 2023-01-27 NOTE — Telephone Encounter (Signed)
Returned Pt's call regarding medication refills. Pt asking for refill of methadone and levothyroxine. Instructed Pt that Methadone refill has been forwarded to PA; levothyroxine has 2 refills still available at pharmacy. Pt verbalized understanding. Pt also states that he has experienced difficulty swallowing x3 days that has caused anxiety. Pt denies fever, congestion, sore throat, or chest pain. Reached out to [facility] for 01/29/23 appt per Pt request.

## 2023-01-27 NOTE — Telephone Encounter (Signed)
After speaking with PA, Pt scheduled for [facility] visit on 01/29/23 for dysphagia. Pt verbalized understanding.

## 2023-01-27 NOTE — Progress Notes (Signed)
Pain medication refill sent per patient's request.

## 2023-01-28 ENCOUNTER — Other Ambulatory Visit (HOSPITAL_COMMUNITY): Payer: Self-pay

## 2023-01-28 ENCOUNTER — Encounter: Payer: Self-pay | Admitting: Hematology and Oncology

## 2023-01-28 ENCOUNTER — Other Ambulatory Visit: Payer: Self-pay | Admitting: Hematology and Oncology

## 2023-01-28 ENCOUNTER — Other Ambulatory Visit (HOSPITAL_BASED_OUTPATIENT_CLINIC_OR_DEPARTMENT_OTHER): Payer: Self-pay

## 2023-01-28 NOTE — Progress Notes (Deleted)
Symptom Management Consult Note [facility]    Patient Care Team: Artis Delay, MD as PCP - General (Hematology and Oncology) Artis Delay, MD as Consulting Physician (Hematology and Oncology) Elfredia Nevins, MD as Referring Physician (Plastic Surgery) Hilarie Fredrickson, MD as Consulting Physician (Gastroenterology)    Name / MRN / DOB: Christian Simmons  [id number]  01-12-1986   Date of visit: 01/29/2023   Chief Complaint/Reason for visit: difficulty swallowing   Current Therapy: Cisplatin and Gemzar  Last treatment:  Day 8   Cycle 2 on 01/22/23   ASSESSMENT & PLAN: Patient is a 37 y.o. 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 Bertis Ruddy.  I have viewed most recent oncology note and lab work.    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 Pan-endoscopy elsewhere showed cancer from nasopharyngeal space.   01/04/2007 - 02/20/2007 Chemotherapy   He received 2 cycles of cisplatin and 5FU followed by concurrent chemo with weekly cisplatin and radiation. He only received 2 doses of chemo due to severe mucositis, nausea and weight loss.   04/05/2007 - 08/04/2007 Chemotherapy   He received 4 more courses of cisplatin with 5FU and had complete response   07/05/2009 Procedure   Fine-needle aspirate of the right level II  lymph nodes come from recurrent metastatic disease. Repeat endoscopy and CT scan show no evidence of disease elsewhere.   07/08/2009 - 12/02/2009 Chemotherapy   He was given 6 cycles of carboplatin, 5-FU and docetaxel   12/03/2009 Surgery   He has surgery to the residual lymph node on the right neck which showed no evidence of disease.   02/22/2012 Imaging   Repeat imaging study showed large recurrent mass. He was referred elsewhere for further treatment.   05/03/2012 Surgery   He underwent left upper lobectomy.   04/29/2013 Imaging   PEt scan showed lesion on right level II B and lower lung was abnormal   06/03/2013 - 02/02/2014 Chemotherapy   He had 6 cycles of chemotherapy when he was found to have recurrence of cancer and had received oxaliplatin and capecitabine   06/07/2014 Imaging   PET CT scan showed persistent disease in the right neck lymph nodes and left lung   06/29/2014 Procedure   Accession: [id number] repeat LUL biopsy confirmed metastatic cancer   07/18/2014 - 07/31/2014 Radiation Therapy   He received palliative radiation therapy to the lungs   10/10/2014 Imaging   CT scan of the chest, abdomen and pelvis show regression in the size of the lung nodule in the left upper lobe and stable pulmonary nodules   01/24/2015 Imaging   CT scan showed stable disease in neck and lung   06/19/2015 Imaging   CT scan of the neck and the chest show possible  mild progression of the nodule in the right side of the neck.   06/25/2015 Imaging   PET scan confirmed disease recurrence in the neck   07/07/2015 Imaging   He had MRI neck at [facility]   09/03/2015 - 08/26/2018 Chemotherapy   He received palliative chemo with Nivolumab   10/29/2015 Imaging   PET CT showed positive response to Rx   02/28/2016 Imaging   Ct abdomen showed abnormal thinkening in his stomach   03/03/2016 Imaging   CT: Right sternocleidomastoid muscle metastasis appears less distinct but otherwise not significantly changed  in size or configuration since 06/19/2015.2. Left level 3 lymph node which was hypermetabolic by PET-CT in January 2017 appears slightly smaller   04/01/2016 Imaging   CT cervical spine showed no acute fracture or traumatic malalignment in the cervical spine   04/22/2016 Procedure   Port-a-cath placed.   06/16/2016 Imaging   Ct neck showed right sternocleidomastoid muscle metastasis is further decreased in conspicuity since May, and has mildly decreased in size since September 2016. Continued stability of sub-centimeter left cervical lymph nodes. No new or progressive metastatic disease in the neck.   06/16/2016 Imaging   CT chest showed stable masslike radiation fibrosis in the left upper lobe. Stable subcentimeter pulmonary nodules in the bilateral lower lobes. No new or progressive metastatic disease in the chest. Nonobstructing left renal stone.   10/13/2016 Imaging   Ct neck showed unchanged right sternocleidomastoid muscle metastasis. Unchanged subcentimeter left cervical lymph nodes. No evidence of new or progressive metastatic disease in the neck.   10/13/2016 Imaging   CT chest showed tiny hypervascular foci in the liver, not definitely seen on prior imaging of 06/16/2016 and 02/28/2016. Abdomen MRI without and with contrast recommended to further evaluate as metastatic disease is a concern. 2. Stable appearance of post treatment changes left upper lung and scattered tiny bilateral pulmonary nodules.   02/11/2017 Imaging   Ct neck: Lymph node mass right posterior neck appears improved from the prior study. Small posterior lymph nodes on the left unchanged. Occluded right jugular vein unchanged.   02/11/2017 Imaging   1. Similar appearance of postsurgical and radiation changes in the left upper lobe. 2. Similar bilateral pulmonary nodules. 3. No thoracic adenopathy. 4. Subtle foci of post-contrast enhancement within the liver are suboptimally characterized on this nondedicated study.  Likely similar. These could either be re-evaluated at followup or more entirely characterized with abdominal MRI. 5. Left nephrolithiasis.   05/19/2017 Imaging   Matted lymph node mass right posterior neck appears larger in the recent CT. Accurate measurements difficult due to infiltrating tumor margins and infiltration of the muscle. Right jugular vein again appears occluded or resected. Small left posterior lymph nodes stable. Left upper lobe airspace density stable and similar to the prior CT   06/03/2017 PET scan   1. Hypermetabolic ill-defined right level IIb lymph node, about 1.3 cm in diameter with maximum SUV 9.5 (formerly 8.1). Appearance suspicious for residual/recurrent malignancy. No worrisome left-sided lesion. 2. Left suprahilar indistinct opacity demonstrates no worrisome hypermetabolic activity. The 5 mm left lower lobe pulmonary nodule is stable and not currently hypermetabolic although below sensitive PET-CT size thresholds. 3. Other imaging findings of potential clinical significance: Bilateral nonobstructive nephrolithiasis. Chronic bilateral maxillary sinusitis.   05/11/2018 PET scan   1. Continued chronic accentuated metabolic activity in the vicinity of right level IIB and the adjacent right sternocleidomastoid muscle, with ill definition of surrounding tissue planes. Maximum SUV is currently 8.1, formerly 9.5. Accentuated metabolic  activity is been present in this vicinity back through 06/25/2015, and there was also some low-level activity in this vicinity on 06/07/2014. Some of this may be from scarring and local muscular activity although clearly a component of residual tumor is difficult to exclude given the focally high activity. 2. Other imaging findings of potential clinical significance: Chronic bilateral maxillary sinusitis. Chronic scarring in the left upper lobe. Chronically stable 5 mm left lower lobe nodule is considered benign. Nonobstructive left nephrolithiasis.    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 COMMENT:No significant keratinization is identified. Other basaloid carcinomas are in the differential diagnosis. No cell block material is available for further testing.   09/12/2018 Procedure   He underwent fine Needle Aspiration   10/04/2018 PET scan   1. Significant progression of local recurrence laterally in the mid right neck with an enlarging, increasingly hypermetabolic soft tissue mass. This involves the right sternocleidomastoid muscle. 2. Small lymph nodes in the right axilla are increasingly hypermetabolic. These are nonspecific and potentially reactive, although could reflect a small metastases. Small hypermetabolic nodule in the left suprasternal notch is unchanged. 3. No other evidence of metastatic disease.     10/07/2018 - 12/23/2018 Chemotherapy   The patient had cisplatin plus gemzar   12/07/2018 Imaging   1. Decreased size of lateral right neck mass. 2. Unchanged soft tissue nodule in the suprasternal notch. 3. No evidence of new metastatic disease in the neck.     05/30/2019 Imaging   CT neck No clear change or progression compared to the study of March. Overall measurements of the right lateral neck mass are similar, approximately 3 x 1.8 cm. See above discussion. One could argue that there is slight increase in lateral bulging, possibly with an increase in contrast enhancement, towards the inferior margin. This is of questionable validity but could possibly represent some progression or inflammatory change. Other findings in the region are stable.   07/20/2019 - 09/15/2019 Chemotherapy   The patient had dexamethasone (DECADRON) 4 MG tablet, 1 of 1 cycle, Start date: --, End date: -- palonosetron (ALOXI) injection 0.25 mg, 0.25 mg, Intravenous,  Once, 3 of 4 cycles Administration:  0.25 mg (07/20/2019), 0.25 mg (08/10/2019), 0.25 mg (09/08/2019) CISplatin (PLATINOL) 84 mg in sodium chloride 0.9 % 250 mL chemo infusion, 40 mg/m2 = 84 mg (80 % of original dose 50 mg/m2), Intravenous,  Once, 3 of 4 cycles Dose modification: 40 mg/m2 (80 % of original dose 50 mg/m2, Cycle 1, Reason: Dose Not Tolerated) Administration: 84 mg (07/20/2019), 84 mg (08/10/2019), 83 mg (09/08/2019) gemcitabine (GEMZAR) 1,600 mg in sodium chloride 0.9 % 250 mL chemo infusion, 1,672 mg (80 % of original dose 1,000 mg/m2), Intravenous,  Once, 3 of 4 cycles Dose modification: 800 mg/m2 (80 % of original dose 1,000 mg/m2, Cycle 1, Reason: Provider Judgment) Administration: 1,600 mg (07/20/2019), 1,600 mg (07/27/2019), 1,600 mg (08/10/2019), 1,600 mg (08/17/2019), 1,600 mg (09/08/2019), 1,672 mg (09/15/2019) ondansetron (ZOFRAN) 8 mg, dexamethasone (DECADRON) 10 mg in sodium chloride 0.9 % 50 mL IVPB, , Intravenous,  Once, 3 of 4 cycles Administration:  (09/15/2019) fosaprepitant (EMEND) 150 mg, dexamethasone (DECADRON) 12 mg in sodium chloride 0.9 % 145 mL IVPB, , Intravenous,  Once, 3 of 4 cycles Administration:  (07/20/2019),  (08/10/2019),  (09/08/2019)  for chemotherapy treatment.    10/02/2019 Imaging   CT neck As compared  to 05/30/2019, no significant interval change in size of an ill-defined mass within the right lateral neck, again measuring 3.3 x 1.8 cm in transaxial dimensions.   Unchanged mildly enlarged left level I lymph node measuring 1.1 cm in short axis.   Unchanged node or nodule at the thoracic inlet, measuring 1.3 x 0.8 cm.   Please refer to concurrently performed chest CT for a description of findings below the level of the thoracic inlet.     10/02/2019 Imaging   CT chest 1. No new or progressive findings in the chest to suggest metastatic disease. 2. Bilateral subcentimeter solid pulmonary nodules are stable since 2018. 3. Hyperdense 1.1 cm anterior liver focus, not clearly  visualized on prior studies. Suggest MRI abdomen without and with IV contrast for further characterization.   10/20/2019 - 12/29/2019 Chemotherapy   The patient had ondansetron (ZOFRAN) injection 8 mg, 8 mg (100 % of original dose 8 mg), Intravenous,  Once, 2 of 5 cycles Dose modification: 8 mg (original dose 8 mg, Cycle 2) Administration: 8 mg (11/17/2019), 8 mg (12/15/2019), 8 mg (12/29/2019) gemcitabine (GEMZAR) 2,000 mg in sodium chloride 0.9 % 250 mL chemo infusion, 2,090 mg, Intravenous,  Once, 3 of 6 cycles Administration: 2,000 mg (10/20/2019), 2,000 mg (11/03/2019), 2,000 mg (11/17/2019), 2,000 mg (12/15/2019), 2,000 mg (12/29/2019)  for chemotherapy treatment.    06/12/2020 Imaging   1. Enlarging superficial, exophytic component of the chronic right sternocleidomastoid muscle mass. See series 6, image 55. 2. Elsewhere stable CT appearance of the Neck.   06/12/2020 Imaging   Post treatment scarring in the left hemithorax, stable. No evidence recurrent or metastatic disease   06/14/2020 - 10/15/2020 Chemotherapy   He received carboplatin, 5FU and Rande Lawman       10/31/2020 Procedure   Interval improvement in right lateral lymph node mass. Improvement in dermal component as well as invasion of the right sternocleidomastoid muscle.   10 mm submental lymph node slightly enlarged compared to the prior study. Continued follow-up recommended.   11/01/2020 - 05/22/2022 Chemotherapy   Patient is on Treatment Plan : HEAD/NECK Pembrolizumab Q21D     07/03/2022 - 10/30/2022 Chemotherapy   Patient is on Treatment Plan : Head and neck Pembrolizumab (400) q42d     11/30/2022 Imaging   CT chest  1. Stable exam. No new or progressive findings to suggest recurrent or metastatic disease. 2. Stable tiny bilateral nodules since 06/12/2020, consistent with benign etiology. 3. Punctate nonobstructing left renal stone.   12/01/2022 Imaging   CT neck  Growing right neck mass since 2022 with epidural tumor  extension via the right C2-3 foramen. Cervical MRI with contrast would be contributory.   12/11/2022 -  Chemotherapy   Patient is on Treatment Plan : HEAD/NECK toripalimab-tpzi D1 + cisplatin D1 + gemcitabine D1,8 q21d x 6 cycles / toripalimab-tpzi q21d (up to 30m)         Interval history-: Christian Simmons is a 37 y.o. male with oncologic history as above presenting to [facility] today with chief complaint of      ROS  All other systems are reviewed and are negative for acute change except as noted in the HPI.    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   Nasopharyngeal cancer 06/01/2014   Neuropathy    Radiation 07/18/14-07/31/14   Left upper lobe  40 gy in 10 fractions   Seizures    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     Past Surgical History:  Procedure Laterality Date   IR CV LINE INJECTION  10/09/2022   LUNG REMOVAL, PARTIAL  05/03/2012   left upper lobectomy   nasal biopsy     RADICAL NECK DISSECTION     VIDEO BRONCHOSCOPY N/A 06/29/2014   Procedure: VIDEO BRONCHOSCOPY ;  Surgeon: Loreli Slot, MD;  Location: [facility] OR;  Service: Thoracic;  Laterality: N/A;    Social History   Socioeconomic History   Marital status: Married    Spouse name: Not on file   Number of children: 1   Years of education: Not on file   Highest education level: Not on file  Occupational History   Not on file  Tobacco Use   Smoking status: Never   Smokeless tobacco: Never  Vaping Use   Vaping Use: Never used  Substance and Sexual Activity    Alcohol use: No   Drug use: No   Sexual activity: Not on file  Other Topics Concern   Not on file  Social History Narrative   ** Merged History Encounter **       Social Determinants of Health   Financial Resource Strain: Not on file  Food Insecurity: Not on file  Transportation Needs: Not on file  Physical Activity: Not on file  Stress: Not on file  Social Connections: Not on file  Intimate Partner Violence: Not on file    Family History  Problem Relation Age of Onset   Hypertension Mother    Diabetes Mother    Hypertension Father    Diabetes Father      Current Outpatient Medications:    blood glucose meter kit and supplies KIT, Dispense based on patient and insurance preference. Use up to four times daily as directed. (FOR ICD-9 250.00, 250.01)., Disp: 1 each, Rfl: 1   cetirizine (ZYRTEC) 10 MG tablet, Take 1 tablet (10 mg total) by mouth daily as needed for allergies., Disp: 100 tablet, Rfl: 0   dexamethasone (DECADRON) 4 MG tablet, Take 1 tablet (4 mg total) by mouth daily for 3 days after chemo, every 2 weeks., Disp: 30 tablet, Rfl: 0   levothyroxine (SYNTHROID) 150 MCG tablet, Take 1 tablet (150 mcg total) by mouth daily before breakfast., Disp: 30 tablet, Rfl: 2   LORazepam (ATIVAN) 0.5 MG tablet, Take 1 tablet (0.5 mg total) by mouth 2 (two) times daily as needed for anxiety., Disp: 30 tablet, Rfl: 0   magic mouthwash (nystatin, lidocaine, diphenhydrAMINE, alum & mag hydroxide) suspension, Swish and swallow 5 mLs 4 (four) times daily as needed for mouth pain., Disp: 180 mL, Rfl: 1   metFORMIN (GLUCOPHAGE) 500 MG tablet, Take 1 tablet (500 mg total) by mouth 2 (two) times daily with a meal., Disp: 60 tablet, Rfl: 2   methadone (DOLOPHINE) 10 MG tablet, Take 1 tablet (10 mg total) by mouth every 12 (twelve) hours., Disp: 60 tablet, Rfl: 0   ondansetron (ZOFRAN) 8 MG tablet, Take 1 tablet (8 mg total) by mouth every 8 (eight) hours as needed for  nausea or vomiting.,  Disp: 90 tablet, Rfl: 1   Oxycodone HCl 10 MG TABS, Take 1 tablet (10 mg total) by mouth every 4 (four) hours as needed., Disp: 90 tablet, Rfl: 0   polyethylene glycol (MIRALAX / GLYCOLAX) 17 g packet, Take 17 g by mouth 2 (two) times daily., Disp: , Rfl:    senna-docusate (SENOKOT-S) 8.6-50 MG tablet, Take 2 tablets by mouth 2 (two) times daily., Disp: 100 tablet, Rfl: 3 No current facility-administered medications for this visit.  Facility-Administered Medications Ordered in Other Visits:    anticoagulant sodium citrate solution 5 mL, 5 mL, Intracatheter, Once, Gorsuch, Ni, MD   anticoagulant sodium citrate solution 5 mL, 5 mL, Intracatheter, Once, Bertis Ruddy, Ni, MD  PHYSICAL EXAM: ECOG FS:{CHL ONC ZO:[id number]}   There were no vitals filed for this visit. Physical Exam     LABORATORY DATA: I have reviewed the data as listed    Latest Ref Rng & Units 01/22/2023    9:35 AM 01/08/2023    8:16 AM 12/31/2022   11:51 AM  CBC  WBC 4.0 - 10.5 K/uL 4.2  5.4  3.9   Hemoglobin 13.0 - 17.0 g/dL 81.1  91.4  78.2   Hematocrit 39.0 - 52.0 % 37.2  33.5  33.9   Platelets 150 - 400 K/uL 364  192  447         Latest Ref Rng & Units 01/22/2023    9:35 AM 01/08/2023    8:16 AM 12/31/2022   11:51 AM  CMP  Glucose 70 - 99 mg/dL 956  213  086   BUN 6 - 20 mg/dL 15  12  13    Creatinine 0.61 - 1.24 mg/dL 5.78  4.69  6.29   Sodium 135 - 145 mmol/L 139  140  140   Potassium 3.5 - 5.1 mmol/L 3.6  3.7  4.0   Chloride 98 - 111 mmol/L 101  102  102   CO2 22 - 32 mmol/L 30  31  29    Calcium 8.9 - 10.3 mg/dL 9.8  9.3  9.4   Total Protein 6.5 - 8.1 g/dL 7.9  7.0  7.5   Total Bilirubin 0.3 - 1.2 mg/dL 0.3  0.2  0.2   Alkaline Phos 38 - 126 U/L 82  73  84   AST 15 - 41 U/L 24  25  26    ALT 0 - 44 U/L 28  34  34        RADIOGRAPHIC STUDIES (from last 24 hours if applicable) I have personally reviewed the radiological images as listed and agreed with the findings in the report. No results found.       Visit Diagnosis: No diagnosis found.   No orders of the defined types were placed in this encounter.   All questions were answered. The patient knows to call the clinic with any problems, questions or concerns. No barriers to learning was detected.  A total of more than *** minutes were spent on this encounter with face-to-face time and non-face-to-face time, including preparing to see the patient, ordering tests and/or medications, counseling the patient and coordination of care as outlined above.    Thank you for allowing me to participate in the care of this patient.    Shanon Ace, PA-C Department of Hematology/Oncology [facility] Phone: [phone]  [phone]    01/28/2023 4:11 PM

## 2023-01-29 ENCOUNTER — Other Ambulatory Visit (HOSPITAL_BASED_OUTPATIENT_CLINIC_OR_DEPARTMENT_OTHER): Payer: Self-pay

## 2023-01-29 ENCOUNTER — Inpatient Hospital Stay: Payer: Medicaid Other | Admitting: Physician Assistant

## 2023-01-29 ENCOUNTER — Other Ambulatory Visit (HOSPITAL_COMMUNITY): Payer: Self-pay

## 2023-01-29 MED ORDER — METFORMIN HCL 500 MG PO TABS
500.0000 mg | ORAL_TABLET | Freq: Two times a day (BID) | ORAL | 2 refills | Status: DC
  Filled 2023-01-29 – 2023-05-05 (×3): qty 60, 30d supply, fill #0
  Filled 2023-07-29: qty 60, 30d supply, fill #1

## 2023-01-30 ENCOUNTER — Other Ambulatory Visit (HOSPITAL_COMMUNITY): Payer: Self-pay

## 2023-02-02 ENCOUNTER — Other Ambulatory Visit (HOSPITAL_BASED_OUTPATIENT_CLINIC_OR_DEPARTMENT_OTHER): Payer: Self-pay

## 2023-02-02 ENCOUNTER — Other Ambulatory Visit (HOSPITAL_COMMUNITY): Payer: Self-pay

## 2023-02-04 MED FILL — Fosaprepitant Dimeglumine For IV Infusion 150 MG (Base Eq): INTRAVENOUS | Qty: 5 | Status: AC

## 2023-02-04 MED FILL — Dexamethasone Sodium Phosphate Inj 100 MG/10ML: INTRAMUSCULAR | Qty: 1 | Status: AC

## 2023-02-05 ENCOUNTER — Inpatient Hospital Stay: Payer: Medicaid Other

## 2023-02-05 ENCOUNTER — Other Ambulatory Visit: Payer: Self-pay

## 2023-02-05 ENCOUNTER — Other Ambulatory Visit: Payer: Self-pay | Admitting: Hematology and Oncology

## 2023-02-05 ENCOUNTER — Encounter: Payer: Self-pay | Admitting: Hematology and Oncology

## 2023-02-05 ENCOUNTER — Inpatient Hospital Stay: Payer: Medicaid Other | Attending: Hematology and Oncology | Admitting: Hematology and Oncology

## 2023-02-05 ENCOUNTER — Other Ambulatory Visit (HOSPITAL_COMMUNITY): Payer: Self-pay

## 2023-02-05 VITALS — BP 139/83 | HR 84 | Temp 97.6°F | Resp 18 | Ht 69.0 in | Wt 186.4 lb

## 2023-02-05 VITALS — BP 132/86 | HR 92 | Resp 18

## 2023-02-05 DIAGNOSIS — C760 Malignant neoplasm of head, face and neck: Secondary | ICD-10-CM | POA: Diagnosis not present

## 2023-02-05 DIAGNOSIS — Z5111 Encounter for antineoplastic chemotherapy: Secondary | ICD-10-CM | POA: Insufficient documentation

## 2023-02-05 DIAGNOSIS — Z95828 Presence of other vascular implants and grafts: Secondary | ICD-10-CM

## 2023-02-05 DIAGNOSIS — Z7189 Other specified counseling: Secondary | ICD-10-CM | POA: Diagnosis not present

## 2023-02-05 DIAGNOSIS — G893 Neoplasm related pain (acute) (chronic): Secondary | ICD-10-CM | POA: Diagnosis not present

## 2023-02-05 DIAGNOSIS — E119 Type 2 diabetes mellitus without complications: Secondary | ICD-10-CM | POA: Diagnosis not present

## 2023-02-05 DIAGNOSIS — Z79899 Other long term (current) drug therapy: Secondary | ICD-10-CM | POA: Insufficient documentation

## 2023-02-05 DIAGNOSIS — F411 Generalized anxiety disorder: Secondary | ICD-10-CM

## 2023-02-05 DIAGNOSIS — C7949 Secondary malignant neoplasm of other parts of nervous system: Secondary | ICD-10-CM | POA: Diagnosis not present

## 2023-02-05 DIAGNOSIS — C119 Malignant neoplasm of nasopharynx, unspecified: Secondary | ICD-10-CM | POA: Insufficient documentation

## 2023-02-05 DIAGNOSIS — C7800 Secondary malignant neoplasm of unspecified lung: Secondary | ICD-10-CM

## 2023-02-05 LAB — CMP (CANCER CENTER ONLY)
ALT: 32 U/L (ref 0–44)
AST: 26 U/L (ref 15–41)
Albumin: 4.1 g/dL (ref 3.5–5.0)
Alkaline Phosphatase: 67 U/L (ref 38–126)
Anion gap: 7 (ref 5–15)
BUN: 14 mg/dL (ref 6–20)
CO2: 30 mmol/L (ref 22–32)
Calcium: 8.8 mg/dL — ABNORMAL LOW (ref 8.9–10.3)
Chloride: 103 mmol/L (ref 98–111)
Creatinine: 0.83 mg/dL (ref 0.61–1.24)
GFR, Estimated: >60 mL/min (ref >60)
Glucose, Bld: 136 mg/dL — ABNORMAL HIGH (ref 70–99)
Potassium: 3.8 mmol/L (ref 3.5–5.1)
Sodium: 140 mmol/L (ref 135–145)
Total Bilirubin: 0.2 mg/dL — ABNORMAL LOW (ref 0.3–1.2)
Total Protein: 6.7 g/dL (ref 6.5–8.1)

## 2023-02-05 LAB — CBC WITH DIFFERENTIAL (CANCER CENTER ONLY)
Abs Immature Granulocytes: 0.01 10*3/uL (ref 0.00–0.07)
Basophils Absolute: 0 10*3/uL (ref 0.0–0.1)
Basophils Relative: 1 %
Eosinophils Absolute: 0.2 10*3/uL (ref 0.0–0.5)
Eosinophils Relative: 3 %
HCT: 33.5 % — ABNORMAL LOW (ref 39.0–52.0)
Hemoglobin: 10.7 g/dL — ABNORMAL LOW (ref 13.0–17.0)
Immature Granulocytes: 0 %
Lymphocytes Relative: 31 %
Lymphs Abs: 1.6 10*3/uL (ref 0.7–4.0)
MCH: 28.3 pg (ref 26.0–34.0)
MCHC: 31.9 g/dL (ref 30.0–36.0)
MCV: 88.6 fL (ref 80.0–100.0)
Monocytes Absolute: 0.6 10*3/uL (ref 0.1–1.0)
Monocytes Relative: 11 %
Neutro Abs: 2.7 10*3/uL (ref 1.7–7.7)
Neutrophils Relative %: 54 %
Platelet Count: 215 10*3/uL (ref 150–400)
RBC: 3.78 MIL/uL — ABNORMAL LOW (ref 4.22–5.81)
RDW: 16.2 % — ABNORMAL HIGH (ref 11.5–15.5)
WBC Count: 5.1 10*3/uL (ref 4.0–10.5)
nRBC: 0 % (ref 0.0–0.2)

## 2023-02-05 LAB — TSH: TSH: 4.988 u[IU]/mL — ABNORMAL HIGH (ref 0.350–4.500)

## 2023-02-05 LAB — MAGNESIUM: Magnesium: 1.8 mg/dL (ref 1.7–2.4)

## 2023-02-05 MED ORDER — SODIUM CHLORIDE 0.9 % IV SOLN
10.0000 mg | Freq: Once | INTRAVENOUS | Status: AC
  Administered 2023-02-05: 10 mg via INTRAVENOUS
  Filled 2023-02-05: qty 10

## 2023-02-05 MED ORDER — TORIPALIMAB-TPZI CHEMO INJECTION 240 MG/6ML
240.0000 mg | Freq: Once | INTRAVENOUS | Status: AC
  Administered 2023-02-05: 240 mg via INTRAVENOUS
  Filled 2023-02-05: qty 6

## 2023-02-05 MED ORDER — SODIUM CHLORIDE 0.9 % IV SOLN: Freq: Once | INTRAVENOUS | Status: DC

## 2023-02-05 MED ORDER — SODIUM CHLORIDE 0.9 % IV SOLN: Freq: Once | INTRAVENOUS | Status: AC

## 2023-02-05 MED ORDER — POTASSIUM CHLORIDE IN NACL 20-0.9 MEQ/L-% IV SOLN
Freq: Once | INTRAVENOUS | Status: AC
  Filled 2023-02-05: qty 1000

## 2023-02-05 MED ORDER — OXYCODONE HCL 10 MG PO TABS
10.0000 mg | ORAL_TABLET | ORAL | 0 refills | Status: DC | PRN
  Filled 2023-02-05: qty 90, 15d supply, fill #0

## 2023-02-05 MED ORDER — SODIUM CHLORIDE 0.9% FLUSH
10.0000 mL | INTRAVENOUS | Status: DC | PRN
  Administered 2023-02-05: 10 mL

## 2023-02-05 MED ORDER — PALONOSETRON HCL INJECTION 0.25 MG/5ML
0.2500 mg | Freq: Once | INTRAVENOUS | Status: AC
  Administered 2023-02-05: 0.25 mg via INTRAVENOUS
  Filled 2023-02-05: qty 5

## 2023-02-05 MED ORDER — SODIUM CHLORIDE 0.9 % IV SOLN
40.0000 mg/m2 | Freq: Once | INTRAVENOUS | Status: AC
  Administered 2023-02-05: 82 mg via INTRAVENOUS
  Filled 2023-02-05: qty 82

## 2023-02-05 MED ORDER — MAGNESIUM SULFATE 2 GM/50ML IV SOLN
2.0000 g | Freq: Once | INTRAVENOUS | Status: AC
  Administered 2023-02-05: 2 g via INTRAVENOUS
  Filled 2023-02-05: qty 50

## 2023-02-05 MED ORDER — SODIUM CHLORIDE 0.9% FLUSH
10.0000 mL | Freq: Once | INTRAVENOUS | Status: AC
  Administered 2023-02-05: 10 mL

## 2023-02-05 MED ORDER — LEVOTHYROXINE SODIUM 175 MCG PO TABS
175.0000 ug | ORAL_TABLET | Freq: Every day | ORAL | 3 refills | Status: DC
  Filled 2023-02-05: qty 30, 30d supply, fill #0
  Filled 2023-05-05: qty 30, 30d supply, fill #1
  Filled 2023-07-29 (×2): qty 30, 30d supply, fill #2

## 2023-02-05 MED ORDER — SODIUM CHLORIDE 0.9 % IV SOLN
750.0000 mg/m2 | Freq: Once | INTRAVENOUS | Status: AC
  Administered 2023-02-05: 1520 mg via INTRAVENOUS
  Filled 2023-02-05: qty 39.98

## 2023-02-05 MED ORDER — LORAZEPAM 0.5 MG PO TABS
0.5000 mg | ORAL_TABLET | Freq: Two times a day (BID) | ORAL | 0 refills | Status: DC | PRN
  Filled 2023-02-05: qty 30, 15d supply, fill #0

## 2023-02-05 MED ORDER — SODIUM CHLORIDE 0.9 % IV SOLN
150.0000 mg | Freq: Once | INTRAVENOUS | Status: AC
  Administered 2023-02-05: 150 mg via INTRAVENOUS
  Filled 2023-02-05: qty 150

## 2023-02-05 MED ORDER — ONDANSETRON HCL 4 MG/2ML IJ SOLN
8.0000 mg | Freq: Once | INTRAMUSCULAR | Status: AC
  Administered 2023-02-05: 8 mg via INTRAVENOUS
  Filled 2023-02-05: qty 4

## 2023-02-05 NOTE — Assessment & Plan Note (Signed)
He has intermittent clinical improvement to the mass on the right side of his neck with treatment Today, it look a bit worse Will proceed with chemotherapy as scheduled We discussed other aspect of recovery; for example, the patient has poorly controlled diabetes due to poor dietary choices I will recheck his neck again in 2 weeks

## 2023-02-05 NOTE — Assessment & Plan Note (Signed)
He has poorly controlled diabetes We discussed importance of dietary modification while on treatment

## 2023-02-05 NOTE — Assessment & Plan Note (Signed)
He has minimal pain now We will continue current dose of oxycodone

## 2023-02-05 NOTE — Patient Instructions (Signed)
[facility]  Discharge Instructions: Thank you for choosing [facility] to provide your oncology and hematology care.   If you have a lab appointment with the Cancer Center, please go directly to the Cancer Center and check in at the registration area.   Wear comfortable clothing and clothing appropriate for easy access to any Portacath or PICC line.   We strive to give you quality time with your provider. You may need to reschedule your appointment if you arrive late (15 or more minutes).  Arriving late affects you and other patients whose appointments are after yours.  Also, if you miss three or more appointments without notifying the office, you may be dismissed from the clinic at the provider's discretion.      For prescription refill requests, have your pharmacy contact our office and allow 72 hours for refills to be completed.    Today you received the following chemotherapy and/or immunotherapy agents: Loqtorzi, Cisplatin, Gemcitabine.      To help prevent nausea and vomiting after your treatment, we encourage you to take your nausea medication as directed.  BELOW ARE SYMPTOMS THAT SHOULD BE REPORTED IMMEDIATELY: *FEVER GREATER THAN 100.4 F (38 C) OR HIGHER *CHILLS OR SWEATING *NAUSEA AND VOMITING THAT IS NOT CONTROLLED WITH YOUR NAUSEA MEDICATION *UNUSUAL SHORTNESS OF BREATH *UNUSUAL BRUISING OR BLEEDING *URINARY PROBLEMS (pain or burning when urinating, or frequent urination) *BOWEL PROBLEMS (unusual diarrhea, constipation, pain near the anus) TENDERNESS IN MOUTH AND THROAT WITH OR WITHOUT PRESENCE OF ULCERS (sore throat, sores in mouth, or a toothache) UNUSUAL RASH, SWELLING OR PAIN  UNUSUAL VAGINAL DISCHARGE OR ITCHING   Items with * indicate a potential emergency and should be followed up as soon as possible or go to the Emergency Department if any problems should occur.  Please show the CHEMOTHERAPY ALERT CARD or  IMMUNOTHERAPY ALERT CARD at check-in to the Emergency Department and triage nurse.  Should you have questions after your visit or need to cancel or reschedule your appointment, please contact [place] [facility]  Dept: [phone]  and follow the prompts.  Office hours are 8:00 a.m. to 4:30 p.m. Monday - Friday. Please note that voicemails left after 4:00 p.m. may not be returned until the following business day.  We are closed weekends and major holidays. You have access to a nurse at all times for urgent questions. Please call the main number to the clinic Dept: [phone] and follow the prompts.   For any non-urgent questions, you may also contact your provider using MyChart. We now offer e-Visits for anyone 49 and older to request care online for non-urgent symptoms. For details visit [email or web address].   Also download the MyChart app! Go to the app store, search "MyChart", open the app, select [place], and log in with your MyChart username and password.

## 2023-02-05 NOTE — Progress Notes (Signed)
Per Bertis Ruddy MD, ok to premed pt before obtaining urine output today.   urine output obtained before beginning chemo infusion.

## 2023-02-05 NOTE — Progress Notes (Signed)
[facility] OFFICE PROGRESS NOTE  Patient Care Team: Artis Delay, MD as PCP - General (Hematology and Oncology) Artis Delay, MD as Consulting Physician (Hematology and Oncology) Elfredia Nevins, MD as Referring Physician (Plastic Surgery) Hilarie Fredrickson, MD as Consulting Physician (Gastroenterology)  ASSESSMENT & PLAN:  Malignant neoplasm of head and neck [facility]) He has intermittent clinical improvement to the mass on the right side of his neck with treatment Today, it look a bit worse Will proceed with chemotherapy as scheduled We discussed other aspect of recovery; for example, the patient has poorly controlled diabetes due to poor dietary choices I will recheck his neck again in 2 weeks  Type 2 diabetes mellitus (HCC) He has poorly controlled diabetes We discussed importance of dietary modification while on treatment  Cancer associated pain He has minimal pain now We will continue current dose of oxycodone  Anxiety, generalized He is coping better I refilled his prescription lorazepam  No orders of the defined types were placed in this encounter.   All questions were answered. The patient knows to call the clinic with any problems, questions or concerns. The total time spent in the appointment was 30 minutes encounter with patients including review of chart and various tests results, discussions about plan of care and coordination of care plan   Artis Delay, MD 02/05/2023 9:09 AM  INTERVAL HISTORY: Please see below for problem oriented charting. he returns for treatment follow-up He noticed his neck mass typically grow a little bit within a few days prior to his next dose of cisplatin and improve after each dose of cisplatin In the meantime, I noticed that he has gained a lot of weight He denies peripheral neuropathy His pain is stable Denies recent constipation no nausea  REVIEW OF SYSTEMS:   Constitutional: Denies fevers, chills or abnormal weight  loss Eyes: Denies blurriness of vision Ears, nose, mouth, throat, and face: Denies mucositis or sore throat Respiratory: Denies cough, dyspnea or wheezes Cardiovascular: Denies palpitation, chest discomfort or lower extremity swelling Gastrointestinal:  Denies nausea, heartburn or change in bowel habits Skin: Denies abnormal skin rashes Lymphatics: Denies new lymphadenopathy or easy bruising Neurological:Denies numbness, tingling or new weaknesses Behavioral/Psych: Mood is stable, no new changes  All other systems were reviewed with the patient and are negative.  I have reviewed the past medical history, past surgical history, social history and family history with the patient and they are unchanged from previous note.  ALLERGIES:  is allergic to phenergan [promethazine hcl], heparin, and clindamycin.  MEDICATIONS:  Current Outpatient Medications  Medication Sig Dispense Refill   blood glucose meter kit and supplies KIT Dispense based on patient and insurance preference. Use up to four times daily as directed. (FOR ICD-9 250.00, 250.01). 1 each 1   cetirizine (ZYRTEC) 10 MG tablet Take 1 tablet (10 mg total) by mouth daily as needed for allergies. 100 tablet 0   dexamethasone (DECADRON) 4 MG tablet Take 1 tablet (4 mg total) by mouth daily for 3 days after chemo, every 2 weeks. 30 tablet 0   levothyroxine (SYNTHROID) 150 MCG tablet Take 1 tablet (150 mcg total) by mouth daily before breakfast. 30 tablet 2   LORazepam (ATIVAN) 0.5 MG tablet Take 1 tablet (0.5 mg total) by mouth 2 (two) times daily as needed for anxiety. 30 tablet 0   magic mouthwash (nystatin, lidocaine, diphenhydrAMINE, alum & mag hydroxide) suspension Swish and swallow 5 mLs 4 (four) times daily as needed for mouth pain. 180  mL 1   metFORMIN (GLUCOPHAGE) 500 MG tablet Take 1 tablet (500 mg total) by mouth 2 (two) times daily with a meal. 60 tablet 2   methadone (DOLOPHINE) 10 MG tablet Take 1 tablet (10 mg total) by mouth  every 12 (twelve) hours. 60 tablet 0   ondansetron (ZOFRAN) 8 MG tablet Take 1 tablet (8 mg total) by mouth every 8 (eight) hours as needed for nausea or vomiting. 90 tablet 1   Oxycodone HCl 10 MG TABS Take 1 tablet (10 mg total) by mouth every 4 (four) hours as needed. 90 tablet 0   polyethylene glycol (MIRALAX / GLYCOLAX) 17 g packet Take 17 g by mouth 2 (two) times daily.     senna-docusate (SENOKOT-S) 8.6-50 MG tablet Take 2 tablets by mouth 2 (two) times daily. 100 tablet 3   No current facility-administered medications for this visit.   Facility-Administered Medications Ordered in Other Visits  Medication Dose Route Frequency Provider Last Rate Last Admin   0.9 %  sodium chloride infusion   Intravenous Once Bertis Ruddy, Jakyrie Totherow, MD       anticoagulant sodium citrate solution 5 mL  5 mL Intracatheter Once Bertis Ruddy, Kelsay Haggard, MD       anticoagulant sodium citrate solution 5 mL  5 mL Intracatheter Once Bertis Ruddy, Kacie Huxtable, MD       magnesium sulfate IVPB 2 g 50 mL  2 g Intravenous Once Artis Delay, MD 50 mL/hr at 02/05/23 0903 2 g at 02/05/23 1610    SUMMARY OF ONCOLOGIC HISTORY: Oncology History Overview Note  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 Pan-endoscopy elsewhere showed cancer from nasopharyngeal space.   01/04/2007 - 02/20/2007 Chemotherapy   He received 2 cycles of cisplatin and 5FU followed by concurrent chemo with weekly cisplatin and radiation. He only received 2 doses of chemo due to severe mucositis, nausea and weight  loss.   04/05/2007 - 08/04/2007 Chemotherapy   He received 4 more courses of cisplatin with 5FU and had complete response   07/05/2009 Procedure   Fine-needle aspirate of the right level II lymph nodes come from recurrent metastatic disease. Repeat endoscopy and CT scan show no evidence of disease elsewhere.   07/08/2009 - 12/02/2009 Chemotherapy   He was given 6 cycles of carboplatin, 5-FU and docetaxel   12/03/2009 Surgery   He has surgery to the residual lymph node on the right neck which showed no evidence of disease.   02/22/2012 Imaging   Repeat imaging study showed large recurrent mass. He was referred elsewhere for further treatment.   05/03/2012 Surgery   He underwent left upper lobectomy.   04/29/2013 Imaging   PEt scan showed lesion on right level II B and lower lung was abnormal   06/03/2013 - 02/02/2014 Chemotherapy   He had 6 cycles of chemotherapy when he was found to have recurrence of cancer and had received oxaliplatin and capecitabine   06/07/2014 Imaging   PET CT scan showed persistent disease in the right neck lymph  nodes and left lung   06/29/2014 Procedure   Accession: [id number] repeat LUL biopsy confirmed metastatic cancer   07/18/2014 - 07/31/2014 Radiation Therapy   He received palliative radiation therapy to the lungs   10/10/2014 Imaging   CT scan of the chest, abdomen and pelvis show regression in the size of the lung nodule in the left upper lobe and stable pulmonary nodules   01/24/2015 Imaging   CT scan showed stable disease in neck and lung   06/19/2015 Imaging   CT scan of the neck and the chest show possible mild progression of the nodule in the right side of the neck.   06/25/2015 Imaging   PET scan confirmed disease recurrence in the neck   07/07/2015 Imaging   He had MRI neck [facility]   09/03/2015 - 08/26/2018 Chemotherapy   He received palliative chemo with Nivolumab   10/29/2015 Imaging   PET CT showed positive response to Rx   02/28/2016  Imaging   Ct abdomen showed abnormal thinkening in his stomach   03/03/2016 Imaging   CT: Right sternocleidomastoid muscle metastasis appears less distinct but otherwise not significantly changed in size or configuration since 06/19/2015.2. Left level 3 lymph node which was hypermetabolic by PET-CT in January 2017 appears slightly smaller   04/01/2016 Imaging   CT cervical spine showed no acute fracture or traumatic malalignment in the cervical spine   04/22/2016 Procedure   Port-a-cath placed.   06/16/2016 Imaging   Ct neck showed right sternocleidomastoid muscle metastasis is further decreased in conspicuity since May, and has mildly decreased in size since September 2016. Continued stability of sub-centimeter left cervical lymph nodes. No new or progressive metastatic disease in the neck.   06/16/2016 Imaging   CT chest showed stable masslike radiation fibrosis in the left upper lobe. Stable subcentimeter pulmonary nodules in the bilateral lower lobes. No new or progressive metastatic disease in the chest. Nonobstructing left renal stone.   10/13/2016 Imaging   Ct neck showed unchanged right sternocleidomastoid muscle metastasis. Unchanged subcentimeter left cervical lymph nodes. No evidence of new or progressive metastatic disease in the neck.   10/13/2016 Imaging   CT chest showed tiny hypervascular foci in the liver, not definitely seen on prior imaging of 06/16/2016 and 02/28/2016. Abdomen MRI without and with contrast recommended to further evaluate as metastatic disease is a concern. 2. Stable appearance of post treatment changes left upper lung and scattered tiny bilateral pulmonary nodules.   02/11/2017 Imaging   Ct neck: Lymph node mass right posterior neck appears improved from the prior study. Small posterior lymph nodes on the left unchanged. Occluded right jugular vein unchanged.   02/11/2017 Imaging   1. Similar appearance of postsurgical and radiation changes in the left upper  lobe. 2. Similar bilateral pulmonary nodules. 3. No thoracic adenopathy. 4. Subtle foci of post-contrast enhancement within the liver are suboptimally characterized on this nondedicated study. Likely similar. These could either be re-evaluated at followup or more entirely characterized with abdominal MRI. 5. Left nephrolithiasis.   05/19/2017 Imaging   Matted lymph node mass right posterior neck appears larger in the recent CT. Accurate measurements difficult due to infiltrating tumor margins and infiltration of the muscle. Right jugular vein again appears occluded or resected. Small left posterior lymph nodes stable. Left upper lobe airspace density stable and similar to the prior CT   06/03/2017 PET scan   1. Hypermetabolic ill-defined right level IIb lymph node, about 1.3 cm in diameter with maximum SUV 9.5 (  formerly 8.1). Appearance suspicious for residual/recurrent malignancy. No worrisome left-sided lesion. 2. Left suprahilar indistinct opacity demonstrates no worrisome hypermetabolic activity. The 5 mm left lower lobe pulmonary nodule is stable and not currently hypermetabolic although below sensitive PET-CT size thresholds. 3. Other imaging findings of potential clinical significance: Bilateral nonobstructive nephrolithiasis. Chronic bilateral maxillary sinusitis.   05/11/2018 PET scan   1. Continued chronic accentuated metabolic activity in the vicinity of right level IIB and the adjacent right sternocleidomastoid muscle, with ill definition of surrounding tissue planes. Maximum SUV is currently 8.1, formerly 9.5. Accentuated metabolic activity is been present in this vicinity back through 06/25/2015, and there was also some low-level activity in this vicinity on 06/07/2014. Some of this may be from scarring and local muscular activity although clearly a component of residual tumor is difficult to exclude given the focally high activity. 2. Other imaging findings of potential clinical  significance: Chronic bilateral maxillary sinusitis. Chronic scarring in the left upper lobe. Chronically stable 5 mm left lower lobe nodule is considered benign. Nonobstructive left nephrolithiasis.   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 COMMENT:No significant keratinization is identified. Other basaloid carcinomas are in the differential diagnosis. No cell block material is available for further testing.   09/12/2018 Procedure   He underwent fine Needle Aspiration   10/04/2018 PET scan   1. Significant progression of local recurrence laterally in the mid right neck with an enlarging, increasingly hypermetabolic soft tissue mass. This involves the right sternocleidomastoid muscle. 2. Small lymph nodes in the right axilla are increasingly hypermetabolic. These are nonspecific and potentially reactive, although could reflect a small metastases. Small hypermetabolic nodule in the left suprasternal notch is unchanged. 3. No other evidence of metastatic disease.     10/07/2018 - 12/23/2018 Chemotherapy   The patient had cisplatin plus gemzar   12/07/2018 Imaging   1. Decreased size of lateral right neck mass. 2. Unchanged soft tissue nodule in the suprasternal notch. 3. No evidence of new metastatic disease in the neck.     05/30/2019 Imaging   CT neck No clear change or progression compared to the study of March. Overall measurements of the right lateral neck mass are similar, approximately 3 x 1.8 cm. See above discussion. One could argue that there is slight increase in lateral bulging, possibly with an increase in contrast enhancement, towards the inferior margin. This is of questionable validity but could possibly represent some progression or inflammatory change. Other findings in the region are stable.   07/20/2019 - 09/15/2019  Chemotherapy   The patient had dexamethasone (DECADRON) 4 MG tablet, 1 of 1 cycle, Start date: --, End date: -- palonosetron (ALOXI) injection 0.25 mg, 0.25 mg, Intravenous,  Once, 3 of 4 cycles Administration: 0.25 mg (07/20/2019), 0.25 mg (08/10/2019), 0.25 mg (09/08/2019) CISplatin (PLATINOL) 84 mg in sodium chloride 0.9 % 250 mL chemo infusion, 40 mg/m2 = 84 mg (80 % of original dose 50 mg/m2), Intravenous,  Once, 3 of 4 cycles Dose modification: 40 mg/m2 (80 % of original dose 50 mg/m2, Cycle 1, Reason: Dose Not Tolerated) Administration: 84 mg (07/20/2019), 84 mg (08/10/2019), 83 mg (09/08/2019) gemcitabine (GEMZAR) 1,600 mg in sodium chloride 0.9 % 250 mL chemo infusion, 1,672 mg (80 % of original dose 1,000 mg/m2), Intravenous,  Once, 3 of 4 cycles Dose modification: 800 mg/m2 (80 % of original dose 1,000  mg/m2, Cycle 1, Reason: Provider Judgment) Administration: 1,600 mg (07/20/2019), 1,600 mg (07/27/2019), 1,600 mg (08/10/2019), 1,600 mg (08/17/2019), 1,600 mg (09/08/2019), 1,672 mg (09/15/2019) ondansetron (ZOFRAN) 8 mg, dexamethasone (DECADRON) 10 mg in sodium chloride 0.9 % 50 mL IVPB, , Intravenous,  Once, 3 of 4 cycles Administration:  (09/15/2019) fosaprepitant (EMEND) 150 mg, dexamethasone (DECADRON) 12 mg in sodium chloride 0.9 % 145 mL IVPB, , Intravenous,  Once, 3 of 4 cycles Administration:  (07/20/2019),  (08/10/2019),  (09/08/2019)  for chemotherapy treatment.    10/02/2019 Imaging   CT neck As compared to 05/30/2019, no significant interval change in size of an ill-defined mass within the right lateral neck, again measuring 3.3 x 1.8 cm in transaxial dimensions.   Unchanged mildly enlarged left level I lymph node measuring 1.1 cm in short axis.   Unchanged node or nodule at the thoracic inlet, measuring 1.3 x 0.8 cm.   Please refer to concurrently performed chest CT for a description of findings below the level of the thoracic inlet.     10/02/2019 Imaging   CT chest 1.  No new or progressive findings in the chest to suggest metastatic disease. 2. Bilateral subcentimeter solid pulmonary nodules are stable since 2018. 3. Hyperdense 1.1 cm anterior liver focus, not clearly visualized on prior studies. Suggest MRI abdomen without and with IV contrast for further characterization.   10/20/2019 - 12/29/2019 Chemotherapy   The patient had ondansetron (ZOFRAN) injection 8 mg, 8 mg (100 % of original dose 8 mg), Intravenous,  Once, 2 of 5 cycles Dose modification: 8 mg (original dose 8 mg, Cycle 2) Administration: 8 mg (11/17/2019), 8 mg (12/15/2019), 8 mg (12/29/2019) gemcitabine (GEMZAR) 2,000 mg in sodium chloride 0.9 % 250 mL chemo infusion, 2,090 mg, Intravenous,  Once, 3 of 6 cycles Administration: 2,000 mg (10/20/2019), 2,000 mg (11/03/2019), 2,000 mg (11/17/2019), 2,000 mg (12/15/2019), 2,000 mg (12/29/2019)  for chemotherapy treatment.    06/12/2020 Imaging   1. Enlarging superficial, exophytic component of the chronic right sternocleidomastoid muscle mass. See series 6, image 55. 2. Elsewhere stable CT appearance of the Neck.   06/12/2020 Imaging   Post treatment scarring in the left hemithorax, stable. No evidence recurrent or metastatic disease   06/14/2020 - 10/15/2020 Chemotherapy   He received carboplatin, 5FU and Rande Lawman       10/31/2020 Procedure   Interval improvement in right lateral lymph node mass. Improvement in dermal component as well as invasion of the right sternocleidomastoid muscle.   10 mm submental lymph node slightly enlarged compared to the prior study. Continued follow-up recommended.   11/01/2020 - 05/22/2022 Chemotherapy   Patient is on Treatment Plan : HEAD/NECK Pembrolizumab Q21D     07/03/2022 - 10/30/2022 Chemotherapy   Patient is on Treatment Plan : Head and neck Pembrolizumab (400) q42d     11/30/2022 Imaging   CT chest  1. Stable exam. No new or progressive findings to suggest recurrent or metastatic disease. 2. Stable tiny  bilateral nodules since 06/12/2020, consistent with benign etiology. 3. Punctate nonobstructing left renal stone.   12/01/2022 Imaging   CT neck  Growing right neck mass since 2022 with epidural tumor extension via the right C2-3 foramen. Cervical MRI with contrast would be contributory.   12/11/2022 -  Chemotherapy   Patient is on Treatment Plan : HEAD/NECK toripalimab-tpzi D1 + cisplatin D1 + gemcitabine D1,8 q21d x 6 cycles / toripalimab-tpzi q21d (up to 68m)       PHYSICAL EXAMINATION: ECOG PERFORMANCE STATUS: 1 -  Symptomatic but completely ambulatory  Vitals:   02/05/23 0829  BP: 139/83  Pulse: 84  Resp: 18  Temp: 97.6 F (36.4 C)  SpO2: 100%   Filed Weights   02/05/23 0829  Weight: 186 lb 6.4 oz (84.6 kg)    GENERAL:alert, no distress and comfortable SKIN: skin color, texture, turgor are normal, no rashes or significant lesions EYES: normal, Conjunctiva are pink and non-injected, sclera clear OROPHARYNX:no exudate, no erythema and lips, buccal mucosa, and tongue normal  NECK: His neck mass is covered with an exudate, slightly worsened appearance compared to prior visit LYMPH:  no palpable lymphadenopathy in the cervical, axillary or inguinal LUNGS: clear to auscultation and percussion with normal breathing effort HEART: regular rate & rhythm and no murmurs and no lower extremity edema ABDOMEN:abdomen soft, non-tender and normal bowel sounds Musculoskeletal:no cyanosis of digits and no clubbing  NEURO: alert & oriented x 3 with fluent speech, no focal motor/sensory deficits  LABORATORY DATA:  I have reviewed the data as listed    Component Value Date/Time   NA 140 02/05/2023 0743   NA 139 09/22/2017 0829   K 3.8 02/05/2023 0743   K 3.5 09/22/2017 0829   CL 103 02/05/2023 0743   CO2 30 02/05/2023 0743   CO2 26 09/22/2017 0829   GLUCOSE 136 (H) 02/05/2023 0743   GLUCOSE 133 09/22/2017 0829   BUN 14 02/05/2023 0743   BUN 14.1 09/22/2017 0829   CREATININE 0.83  02/05/2023 0743   CREATININE 0.9 09/22/2017 0829   CALCIUM 8.8 (L) 02/05/2023 0743   CALCIUM 9.1 09/22/2017 0829   PROT 6.7 02/05/2023 0743   PROT 6.8 09/22/2017 0829   ALBUMIN 4.1 02/05/2023 0743   ALBUMIN 4.1 09/22/2017 0829   AST 26 02/05/2023 0743   AST 22 09/22/2017 0829   ALT 32 02/05/2023 0743   ALT 30 09/22/2017 0829   ALKPHOS 67 02/05/2023 0743   ALKPHOS 55 09/22/2017 0829   BILITOT 0.2 (L) 02/05/2023 0743   BILITOT 0.35 09/22/2017 0829   GFRNONAA >60 02/05/2023 0743   GFRAA >60 07/05/2020 0845   GFRAA >60 02/06/2019 1215    No results found for: "SPEP", "UPEP"  Lab Results  Component Value Date   WBC 5.1 02/05/2023   NEUTROABS 2.7 02/05/2023   HGB 10.7 (L) 02/05/2023   HCT 33.5 (L) 02/05/2023   MCV 88.6 02/05/2023   PLT 215 02/05/2023      Chemistry      Component Value Date/Time   NA 140 02/05/2023 0743   NA 139 09/22/2017 0829   K 3.8 02/05/2023 0743   K 3.5 09/22/2017 0829   CL 103 02/05/2023 0743   CO2 30 02/05/2023 0743   CO2 26 09/22/2017 0829   BUN 14 02/05/2023 0743   BUN 14.1 09/22/2017 0829   CREATININE 0.83 02/05/2023 0743   CREATININE 0.9 09/22/2017 0829      Component Value Date/Time   CALCIUM 8.8 (L) 02/05/2023 0743   CALCIUM 9.1 09/22/2017 0829   ALKPHOS 67 02/05/2023 0743   ALKPHOS 55 09/22/2017 0829   AST 26 02/05/2023 0743   AST 22 09/22/2017 0829   ALT 32 02/05/2023 0743   ALT 30 09/22/2017 0829   BILITOT 0.2 (L) 02/05/2023 0743   BILITOT 0.35 09/22/2017 0829

## 2023-02-05 NOTE — Assessment & Plan Note (Signed)
He is coping better I refilled his prescription lorazepam

## 2023-02-06 ENCOUNTER — Inpatient Hospital Stay: Payer: Medicaid Other

## 2023-02-06 LAB — T4: T4, Total: 6.4 ug/dL (ref 4.5–12.0)

## 2023-02-08 ENCOUNTER — Other Ambulatory Visit (HOSPITAL_COMMUNITY): Payer: Self-pay

## 2023-02-12 ENCOUNTER — Other Ambulatory Visit (HOSPITAL_BASED_OUTPATIENT_CLINIC_OR_DEPARTMENT_OTHER): Payer: Self-pay

## 2023-02-19 ENCOUNTER — Encounter: Payer: Self-pay | Admitting: Hematology and Oncology

## 2023-02-19 ENCOUNTER — Inpatient Hospital Stay (HOSPITAL_BASED_OUTPATIENT_CLINIC_OR_DEPARTMENT_OTHER): Payer: Medicaid Other | Admitting: Hematology and Oncology

## 2023-02-19 ENCOUNTER — Inpatient Hospital Stay: Payer: Medicaid Other

## 2023-02-19 ENCOUNTER — Other Ambulatory Visit: Payer: Self-pay

## 2023-02-19 VITALS — BP 157/89 | HR 84 | Temp 98.3°F | Resp 18 | Ht 69.0 in | Wt 187.0 lb

## 2023-02-19 DIAGNOSIS — Z7189 Other specified counseling: Secondary | ICD-10-CM

## 2023-02-19 DIAGNOSIS — C760 Malignant neoplasm of head, face and neck: Secondary | ICD-10-CM | POA: Diagnosis not present

## 2023-02-19 DIAGNOSIS — G893 Neoplasm related pain (acute) (chronic): Secondary | ICD-10-CM | POA: Diagnosis not present

## 2023-02-19 DIAGNOSIS — E039 Hypothyroidism, unspecified: Secondary | ICD-10-CM | POA: Diagnosis not present

## 2023-02-19 DIAGNOSIS — Z5111 Encounter for antineoplastic chemotherapy: Secondary | ICD-10-CM | POA: Diagnosis not present

## 2023-02-19 DIAGNOSIS — C7800 Secondary malignant neoplasm of unspecified lung: Secondary | ICD-10-CM

## 2023-02-19 DIAGNOSIS — Z95828 Presence of other vascular implants and grafts: Secondary | ICD-10-CM

## 2023-02-19 LAB — CMP (CANCER CENTER ONLY)
ALT: 22 U/L (ref 0–44)
AST: 16 U/L (ref 15–41)
Albumin: 4.2 g/dL (ref 3.5–5.0)
Alkaline Phosphatase: 68 U/L (ref 38–126)
Anion gap: 7 (ref 5–15)
BUN: 14 mg/dL (ref 6–20)
CO2: 30 mmol/L (ref 22–32)
Calcium: 8.7 mg/dL — ABNORMAL LOW (ref 8.9–10.3)
Chloride: 102 mmol/L (ref 98–111)
Creatinine: 0.77 mg/dL (ref 0.61–1.24)
GFR, Estimated: >60 mL/min (ref >60)
Glucose, Bld: 198 mg/dL — ABNORMAL HIGH (ref 70–99)
Potassium: 3.7 mmol/L (ref 3.5–5.1)
Sodium: 139 mmol/L (ref 135–145)
Total Bilirubin: 0.2 mg/dL — ABNORMAL LOW (ref 0.3–1.2)
Total Protein: 6.6 g/dL (ref 6.5–8.1)

## 2023-02-19 LAB — CBC WITH DIFFERENTIAL (CANCER CENTER ONLY)
Abs Immature Granulocytes: 0.03 10*3/uL (ref 0.00–0.07)
Basophils Absolute: 0 10*3/uL (ref 0.0–0.1)
Basophils Relative: 0 %
Eosinophils Absolute: 0.2 10*3/uL (ref 0.0–0.5)
Eosinophils Relative: 4 %
HCT: 32.7 % — ABNORMAL LOW (ref 39.0–52.0)
Hemoglobin: 10.6 g/dL — ABNORMAL LOW (ref 13.0–17.0)
Immature Granulocytes: 1 %
Lymphocytes Relative: 32 %
Lymphs Abs: 1.5 10*3/uL (ref 0.7–4.0)
MCH: 29.1 pg (ref 26.0–34.0)
MCHC: 32.4 g/dL (ref 30.0–36.0)
MCV: 89.8 fL (ref 80.0–100.0)
Monocytes Absolute: 0.5 10*3/uL (ref 0.1–1.0)
Monocytes Relative: 12 %
Neutro Abs: 2.3 10*3/uL (ref 1.7–7.7)
Neutrophils Relative %: 51 %
Platelet Count: 311 10*3/uL (ref 150–400)
RBC: 3.64 MIL/uL — ABNORMAL LOW (ref 4.22–5.81)
RDW: 16.6 % — ABNORMAL HIGH (ref 11.5–15.5)
WBC Count: 4.6 10*3/uL (ref 4.0–10.5)
nRBC: 0 % (ref 0.0–0.2)

## 2023-02-19 MED ORDER — SODIUM CHLORIDE 0.9 % IV SOLN
750.0000 mg/m2 | Freq: Once | INTRAVENOUS | Status: AC
  Administered 2023-02-19: 1520 mg via INTRAVENOUS
  Filled 2023-02-19: qty 39.98

## 2023-02-19 MED ORDER — ONDANSETRON HCL 8 MG PO TABS
8.0000 mg | ORAL_TABLET | Freq: Once | ORAL | Status: AC
  Administered 2023-02-19: 8 mg via ORAL
  Filled 2023-02-19: qty 1

## 2023-02-19 MED ORDER — SODIUM CHLORIDE 0.9 % IV SOLN: Freq: Once | INTRAVENOUS | Status: AC

## 2023-02-19 MED ORDER — SODIUM CHLORIDE 0.9% FLUSH
10.0000 mL | Freq: Once | INTRAVENOUS | Status: AC
  Administered 2023-02-19: 10 mL

## 2023-02-19 MED ORDER — SODIUM CHLORIDE 0.9% FLUSH
10.0000 mL | INTRAVENOUS | Status: DC | PRN
  Administered 2023-02-19: 10 mL

## 2023-02-19 NOTE — Assessment & Plan Note (Addendum)
He has intermittent clinical improvement to the mass on the right side of his neck with treatment He denies neuropathy We will continue current dose without dose adjustment

## 2023-02-19 NOTE — Progress Notes (Signed)
[facility] OFFICE PROGRESS NOTE  Patient Care Team: Artis Delay, MD as PCP - General (Hematology and Oncology) Artis Delay, MD as Consulting Physician (Hematology and Oncology) Elfredia Nevins, MD as Referring Physician (Plastic Surgery) Hilarie Fredrickson, MD as Consulting Physician (Gastroenterology)  ASSESSMENT & PLAN:  Malignant neoplasm of head and neck [facility]) He has intermittent clinical improvement to the mass on the right side of his neck with treatment He denies neuropathy We will continue current dose without dose adjustment  Cancer associated pain He has minimum pain now that his disease is under control He has pain medicine to take as needed  Acquired hypothyroidism He has intermittent elevation of TSH For now, he will keep his current Synthroid dose If his TSH is over 5, I will adjust the dose of his Synthroid  No orders of the defined types were placed in this encounter.   All questions were answered. The patient knows to call the clinic with any problems, questions or concerns. The total time spent in the appointment was 20 minutes encounter with patients including review of chart and various tests results, discussions about plan of care and coordination of care plan   Artis Delay, MD 02/19/2023 9:21 AM  INTERVAL HISTORY: Please see below for problem oriented charting. he returns for treatment follow-up  He tolerated last cycle therapy well Denies hearing deficit or neuropathy Denies nausea or vomiting His pain is well-controlled  REVIEW OF SYSTEMS:   Constitutional: Denies fevers, chills or abnormal weight loss Eyes: Denies blurriness of vision Ears, nose, mouth, throat, and face: Denies mucositis or sore throat Respiratory: Denies cough, dyspnea or wheezes Cardiovascular: Denies palpitation, chest discomfort or lower extremity swelling Gastrointestinal:  Denies nausea, heartburn or change in bowel habits Skin: Denies abnormal skin  rashes Lymphatics: Denies new lymphadenopathy or easy bruising Neurological:Denies numbness, tingling or new weaknesses Behavioral/Psych: Mood is stable, no new changes  All other systems were reviewed with the patient and are negative.  I have reviewed the past medical history, past surgical history, social history and family history with the patient and they are unchanged from previous note.  ALLERGIES:  is allergic to phenergan [promethazine hcl], heparin, and clindamycin.  MEDICATIONS:  Current Outpatient Medications  Medication Sig Dispense Refill   blood glucose meter kit and supplies KIT Dispense based on patient and insurance preference. Use up to four times daily as directed. (FOR ICD-9 250.00, 250.01). 1 each 1   cetirizine (ZYRTEC) 10 MG tablet Take 1 tablet (10 mg total) by mouth daily as needed for allergies. 100 tablet 0   dexamethasone (DECADRON) 4 MG tablet Take 1 tablet (4 mg total) by mouth daily for 3 days after chemo, every 2 weeks. 30 tablet 0   levothyroxine (SYNTHROID) 175 MCG tablet Take 1 tablet (175 mcg total) by mouth daily before breakfast. 30 tablet 3   LORazepam (ATIVAN) 0.5 MG tablet Take 1 tablet (0.5 mg total) by mouth 2 (two) times daily as needed for anxiety. 30 tablet 0   magic mouthwash (nystatin, lidocaine, diphenhydrAMINE, alum & mag hydroxide) suspension Swish and swallow 5 mLs 4 (four) times daily as needed for mouth pain. 180 mL 1   metFORMIN (GLUCOPHAGE) 500 MG tablet Take 1 tablet (500 mg total) by mouth 2 (two) times daily with a meal. 60 tablet 2   methadone (DOLOPHINE) 10 MG tablet Take 1 tablet (10 mg total) by mouth every 12 (twelve) hours. 60 tablet 0   ondansetron (ZOFRAN) 8 MG tablet  Take 1 tablet (8 mg total) by mouth every 8 (eight) hours as needed for nausea or vomiting. 90 tablet 1   Oxycodone HCl 10 MG TABS Take 1 tablet (10 mg total) by mouth every 4 (four) hours as needed. 90 tablet 0   polyethylene glycol (MIRALAX / GLYCOLAX) 17 g  packet Take 17 g by mouth 2 (two) times daily.     senna-docusate (SENOKOT-S) 8.6-50 MG tablet Take 2 tablets by mouth 2 (two) times daily. 100 tablet 3   No current facility-administered medications for this visit.   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 Pan-endoscopy elsewhere showed cancer from nasopharyngeal space.   01/04/2007 - 02/20/2007 Chemotherapy   He received 2 cycles of cisplatin and 5FU followed by concurrent chemo with weekly cisplatin and radiation. He only received 2 doses of chemo due to severe mucositis, nausea and weight loss.   04/05/2007 - 08/04/2007 Chemotherapy   He received 4 more courses of cisplatin with 5FU and had complete response   07/05/2009 Procedure   Fine-needle aspirate of the right level II lymph nodes come from recurrent metastatic disease. Repeat endoscopy and CT scan show no evidence of disease elsewhere.   07/08/2009 - 12/02/2009 Chemotherapy   He was given 6 cycles of carboplatin, 5-FU and docetaxel   12/03/2009 Surgery   He has surgery to the residual lymph node on the right neck which showed no evidence of disease.    02/22/2012 Imaging   Repeat imaging study showed large recurrent mass. He was referred elsewhere for further treatment.   05/03/2012 Surgery   He underwent left upper lobectomy.   04/29/2013 Imaging   PEt scan showed lesion on right level II B and lower lung was abnormal   06/03/2013 - 02/02/2014 Chemotherapy   He had 6 cycles of chemotherapy when he was found to have recurrence of cancer and had received oxaliplatin and capecitabine   06/07/2014 Imaging   PET CT scan showed persistent disease in the right neck lymph nodes and left lung   06/29/2014 Procedure   Accession: [id number] repeat LUL biopsy confirmed metastatic cancer   07/18/2014 - 07/31/2014 Radiation Therapy   He received palliative radiation therapy to the lungs   10/10/2014 Imaging   CT scan of the chest, abdomen and pelvis show regression in the size of the lung nodule in the left upper lobe and stable pulmonary nodules   01/24/2015 Imaging   CT scan showed stable disease in neck and lung   06/19/2015 Imaging   CT scan of the neck and the chest show possible mild progression of the  nodule in the right side of the neck.   06/25/2015 Imaging   PET scan confirmed disease recurrence in the neck   07/07/2015 Imaging   He had [facility] neck [facility]   09/03/2015 - 08/26/2018 Chemotherapy   He received palliative chemo with Nivolumab   10/29/2015 Imaging   PET CT showed positive response to Rx   02/28/2016 Imaging   Ct abdomen showed abnormal thinkening in his stomach   03/03/2016 Imaging   CT: Right sternocleidomastoid muscle metastasis appears less distinct but otherwise not significantly changed in size or configuration since 06/19/2015.2. Left level 3 lymph node which was hypermetabolic by PET-CT in January 2017 appears slightly smaller   04/01/2016 Imaging   CT cervical spine showed no acute fracture or traumatic malalignment in the cervical spine   04/22/2016 Procedure   Port-a-cath placed.   06/16/2016 Imaging   Ct neck  showed right sternocleidomastoid muscle metastasis is further decreased in conspicuity since May, and has mildly decreased in size since September 2016. Continued stability of sub-centimeter left cervical lymph nodes. No new or progressive metastatic disease in the neck.   06/16/2016 Imaging   CT chest showed stable masslike radiation fibrosis in the left upper lobe. Stable subcentimeter pulmonary nodules in the bilateral lower lobes. No new or progressive metastatic disease in the chest. Nonobstructing left renal stone.   10/13/2016 Imaging   Ct neck showed unchanged right sternocleidomastoid muscle metastasis. Unchanged subcentimeter left cervical lymph nodes. No evidence of new or progressive metastatic disease in the neck.   10/13/2016 Imaging   CT chest showed tiny hypervascular foci in the liver, not definitely seen on prior imaging of 06/16/2016 and 02/28/2016. Abdomen MRI without and with contrast recommended to further evaluate as metastatic disease is a concern. 2. Stable appearance of post treatment changes left upper lung and scattered tiny bilateral pulmonary nodules.   02/11/2017 Imaging   Ct neck: Lymph node mass right posterior neck appears improved from the prior study. Small posterior lymph nodes on the left unchanged. Occluded right jugular vein unchanged.   02/11/2017 Imaging   1. Similar appearance of postsurgical and radiation changes in the left upper lobe. 2. Similar bilateral pulmonary nodules. 3. No thoracic adenopathy. 4. Subtle foci of post-contrast enhancement within the liver are suboptimally characterized on this nondedicated study. Likely similar. These could either be re-evaluated at followup or more entirely characterized with abdominal MRI. 5. Left nephrolithiasis.   05/19/2017 Imaging   Matted lymph node mass right posterior neck appears larger in the recent CT. Accurate measurements difficult due to infiltrating tumor margins and infiltration of the muscle. Right  jugular vein again appears occluded or resected. Small left posterior lymph nodes stable. Left upper lobe airspace density stable and similar to the prior CT   06/03/2017 PET scan   1. Hypermetabolic ill-defined right level IIb lymph node, about 1.3 cm in diameter with maximum SUV 9.5 (formerly 8.1). Appearance suspicious for residual/recurrent malignancy. No worrisome left-sided lesion. 2. Left suprahilar indistinct opacity demonstrates no worrisome hypermetabolic activity. The 5 mm left lower lobe pulmonary nodule is stable and not currently hypermetabolic although below sensitive PET-CT size thresholds. 3. Other imaging findings of potential clinical significance: Bilateral nonobstructive nephrolithiasis. Chronic bilateral maxillary sinusitis.   05/11/2018 PET scan   1. Continued chronic accentuated metabolic activity in the vicinity of right level IIB and the adjacent right sternocleidomastoid muscle, with ill definition of surrounding tissue planes. Maximum SUV is currently 8.1, formerly 9.5. Accentuated metabolic activity is been present  in this vicinity back through 06/25/2015, and there was also some low-level activity in this vicinity on 06/07/2014. Some of this may be from scarring and local muscular activity although clearly a component of residual tumor is difficult to exclude given the focally high activity. 2. Other imaging findings of potential clinical significance: Chronic bilateral maxillary sinusitis. Chronic scarring in the left upper lobe. Chronically stable 5 mm left lower lobe nodule is considered benign. Nonobstructive left nephrolithiasis.   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 COMMENT:No significant keratinization is identified. Other basaloid carcinomas are in the differential diagnosis. No cell block material is available for  further testing.   09/12/2018 Procedure   He underwent fine Needle Aspiration   10/04/2018 PET scan   1. Significant progression of local recurrence laterally in the mid right neck with an enlarging, increasingly hypermetabolic soft tissue mass. This involves the right sternocleidomastoid muscle. 2. Small lymph nodes in the right axilla are increasingly hypermetabolic. These are nonspecific and potentially reactive, although could reflect a small metastases. Small hypermetabolic nodule in the left suprasternal notch is unchanged. 3. No other evidence of metastatic disease.     10/07/2018 - 12/23/2018 Chemotherapy   The patient had cisplatin plus gemzar   12/07/2018 Imaging   1. Decreased size of lateral right neck mass. 2. Unchanged soft tissue nodule in the suprasternal notch. 3. No evidence of new metastatic disease in the neck.     05/30/2019 Imaging   CT neck No clear change or progression compared to the study of March. Overall measurements of the right lateral neck mass are similar, approximately 3 x 1.8 cm. See above discussion. One could argue that there is slight increase in lateral bulging, possibly with an increase in contrast enhancement, towards the inferior margin. This is of questionable validity but could possibly represent some progression or inflammatory change. Other findings in the region are stable.   07/20/2019 - 09/15/2019 Chemotherapy   The patient had dexamethasone (DECADRON) 4 MG tablet, 1 of 1 cycle, Start date: --, End date: -- palonosetron (ALOXI) injection 0.25 mg, 0.25 mg, Intravenous,  Once, 3 of 4 cycles Administration: 0.25 mg (07/20/2019), 0.25 mg (08/10/2019), 0.25 mg (09/08/2019) CISplatin (PLATINOL) 84 mg in sodium chloride 0.9 % 250 mL chemo infusion, 40 mg/m2 = 84 mg (80 % of original dose 50 mg/m2), Intravenous,  Once, 3 of 4 cycles Dose modification: 40 mg/m2 (80 % of original dose 50 mg/m2, Cycle 1, Reason: Dose Not Tolerated) Administration: 84 mg  (07/20/2019), 84 mg (08/10/2019), 83 mg (09/08/2019) gemcitabine (GEMZAR) 1,600 mg in sodium chloride 0.9 % 250 mL chemo infusion, 1,672 mg (80 % of original dose 1,000 mg/m2), Intravenous,  Once, 3 of 4 cycles Dose modification: 800 mg/m2 (80 % of original dose 1,000 mg/m2, Cycle 1, Reason: Provider Judgment) Administration: 1,600 mg (07/20/2019), 1,600 mg (07/27/2019), 1,600 mg (08/10/2019), 1,600 mg (08/17/2019), 1,600 mg (09/08/2019), 1,672 mg (09/15/2019) ondansetron (ZOFRAN) 8 mg, dexamethasone (DECADRON) 10 mg in sodium chloride 0.9 % 50 mL IVPB, , Intravenous,  Once, 3 of 4 cycles Administration:  (09/15/2019) fosaprepitant (EMEND) 150 mg, dexamethasone (DECADRON) 12 mg in sodium chloride 0.9 % 145 mL IVPB, , Intravenous,  Once, 3 of 4 cycles Administration:  (07/20/2019),  (08/10/2019),  (09/08/2019)  for chemotherapy treatment.    10/02/2019 Imaging   CT neck As compared to 05/30/2019, no significant  interval change in size of an ill-defined mass within the right lateral neck, again measuring 3.3 x 1.8 cm in transaxial dimensions.   Unchanged mildly enlarged left level I lymph node measuring 1.1 cm in short axis.   Unchanged node or nodule at the thoracic inlet, measuring 1.3 x 0.8 cm.   Please refer to concurrently performed chest CT for a description of findings below the level of the thoracic inlet.     10/02/2019 Imaging   CT chest 1. No new or progressive findings in the chest to suggest metastatic disease. 2. Bilateral subcentimeter solid pulmonary nodules are stable since 2018. 3. Hyperdense 1.1 cm anterior liver focus, not clearly visualized on prior studies. Suggest MRI abdomen without and with IV contrast for further characterization.   10/20/2019 - 12/29/2019 Chemotherapy   The patient had ondansetron (ZOFRAN) injection 8 mg, 8 mg (100 % of original dose 8 mg), Intravenous,  Once, 2 of 5 cycles Dose modification: 8 mg (original dose 8 mg, Cycle 2) Administration: 8 mg  (11/17/2019), 8 mg (12/15/2019), 8 mg (12/29/2019) gemcitabine (GEMZAR) 2,000 mg in sodium chloride 0.9 % 250 mL chemo infusion, 2,090 mg, Intravenous,  Once, 3 of 6 cycles Administration: 2,000 mg (10/20/2019), 2,000 mg (11/03/2019), 2,000 mg (11/17/2019), 2,000 mg (12/15/2019), 2,000 mg (12/29/2019)  for chemotherapy treatment.    06/12/2020 Imaging   1. Enlarging superficial, exophytic component of the chronic right sternocleidomastoid muscle mass. See series 6, image 55. 2. Elsewhere stable CT appearance of the Neck.   06/12/2020 Imaging   Post treatment scarring in the left hemithorax, stable. No evidence recurrent or metastatic disease   06/14/2020 - 10/15/2020 Chemotherapy   He received carboplatin, 5FU and Rande Lawman       10/31/2020 Procedure   Interval improvement in right lateral lymph node mass. Improvement in dermal component as well as invasion of the right sternocleidomastoid muscle.   10 mm submental lymph node slightly enlarged compared to the prior study. Continued follow-up recommended.   11/01/2020 - 05/22/2022 Chemotherapy   Patient is on Treatment Plan : HEAD/NECK Pembrolizumab Q21D     07/03/2022 - 10/30/2022 Chemotherapy   Patient is on Treatment Plan : Head and neck Pembrolizumab (400) q42d     11/30/2022 Imaging   CT chest  1. Stable exam. No new or progressive findings to suggest recurrent or metastatic disease. 2. Stable tiny bilateral nodules since 06/12/2020, consistent with benign etiology. 3. Punctate nonobstructing left renal stone.   12/01/2022 Imaging   CT neck  Growing right neck mass since 2022 with epidural tumor extension via the right C2-3 foramen. Cervical MRI with contrast would be contributory.   12/11/2022 -  Chemotherapy   Patient is on Treatment Plan : HEAD/NECK toripalimab-tpzi D1 + cisplatin D1 + gemcitabine D1,8 q21d x 6 cycles / toripalimab-tpzi q21d (up to 50m)       PHYSICAL EXAMINATION: ECOG PERFORMANCE STATUS: 1 - Symptomatic but  completely ambulatory  Vitals:   02/19/23 0909  BP: (!) 157/89  Pulse: 84  Resp: 18  Temp: 98.3 F (36.8 C)  SpO2: 100%   Filed Weights   02/19/23 0909  Weight: 187 lb (84.8 kg)    GENERAL:alert, no distress and comfortable SKIN: skin color, texture, turgor are normal, no rashes or significant lesions EYES: normal, Conjunctiva are pink and non-injected, sclera clear OROPHARYNX:no exudate, no erythema and lips, buccal mucosa, and tongue normal  NECK: Persistent disease noted on the right side of his neck, with associated fibrosis LYMPH:  no  palpable lymphadenopathy in the cervical, axillary or inguinal LUNGS: clear to auscultation and percussion with normal breathing effort HEART: regular rate & rhythm and no murmurs and no lower extremity edema ABDOMEN:abdomen soft, non-tender and normal bowel sounds Musculoskeletal:no cyanosis of digits and no clubbing  NEURO: alert & oriented x 3 with fluent speech, no focal motor/sensory deficits  LABORATORY DATA:  I have reviewed the data as listed    Component Value Date/Time   NA 140 02/05/2023 0743   NA 139 09/22/2017 0829   K 3.8 02/05/2023 0743   K 3.5 09/22/2017 0829   CL 103 02/05/2023 0743   CO2 30 02/05/2023 0743   CO2 26 09/22/2017 0829   GLUCOSE 136 (H) 02/05/2023 0743   GLUCOSE 133 09/22/2017 0829   BUN 14 02/05/2023 0743   BUN 14.1 09/22/2017 0829   CREATININE 0.83 02/05/2023 0743   CREATININE 0.9 09/22/2017 0829   CALCIUM 8.8 (L) 02/05/2023 0743   CALCIUM 9.1 09/22/2017 0829   PROT 6.7 02/05/2023 0743   PROT 6.8 09/22/2017 0829   ALBUMIN 4.1 02/05/2023 0743   ALBUMIN 4.1 09/22/2017 0829   AST 26 02/05/2023 0743   AST 22 09/22/2017 0829   ALT 32 02/05/2023 0743   ALT 30 09/22/2017 0829   ALKPHOS 67 02/05/2023 0743   ALKPHOS 55 09/22/2017 0829   BILITOT 0.2 (L) 02/05/2023 0743   BILITOT 0.35 09/22/2017 0829   GFRNONAA >60 02/05/2023 0743   GFRAA >60 07/05/2020 0845   GFRAA >60 02/06/2019 1215    No  results found for: "SPEP", "UPEP"  Lab Results  Component Value Date   WBC 4.6 02/19/2023   NEUTROABS 2.3 02/19/2023   HGB 10.6 (L) 02/19/2023   HCT 32.7 (L) 02/19/2023   MCV 89.8 02/19/2023   PLT 311 02/19/2023      Chemistry      Component Value Date/Time   NA 140 02/05/2023 0743   NA 139 09/22/2017 0829   K 3.8 02/05/2023 0743   K 3.5 09/22/2017 0829   CL 103 02/05/2023 0743   CO2 30 02/05/2023 0743   CO2 26 09/22/2017 0829   BUN 14 02/05/2023 0743   BUN 14.1 09/22/2017 0829   CREATININE 0.83 02/05/2023 0743   CREATININE 0.9 09/22/2017 0829      Component Value Date/Time   CALCIUM 8.8 (L) 02/05/2023 0743   CALCIUM 9.1 09/22/2017 0829   ALKPHOS 67 02/05/2023 0743   ALKPHOS 55 09/22/2017 0829   AST 26 02/05/2023 0743   AST 22 09/22/2017 0829   ALT 32 02/05/2023 0743   ALT 30 09/22/2017 0829   BILITOT 0.2 (L) 02/05/2023 0743   BILITOT 0.35 09/22/2017 0829

## 2023-02-19 NOTE — Assessment & Plan Note (Signed)
He has intermittent elevation of TSH For now, he will keep his current Synthroid dose If his TSH is over 5, I will adjust the dose of his Synthroid

## 2023-02-19 NOTE — Patient Instructions (Addendum)
[place] [facility]  Discharge Instructions: Thank you for choosing [facility] to provide your oncology and hematology care.   If you have a lab appointment with the Cancer Center, please go directly to the Cancer Center and check in at the registration area.   Wear comfortable clothing and clothing appropriate for easy access to any Portacath or PICC line.   We strive to give you quality time with your provider. You may need to reschedule your appointment if you arrive late (15 or more minutes).  Arriving late affects you and other patients whose appointments are after yours.  Also, if you miss three or more appointments without notifying the office, you may be dismissed from the clinic at the provider's discretion.      For prescription refill requests, have your pharmacy contact our office and allow 72 hours for refills to be completed.    Today you received the following chemotherapy and/or immunotherapy agent: Gemcitabine (Gemzar)   To help prevent nausea and vomiting after your treatment, we encourage you to take your nausea medication as directed.  BELOW ARE SYMPTOMS THAT SHOULD BE REPORTED IMMEDIATELY: *FEVER GREATER THAN 100.4 F (38 C) OR HIGHER *CHILLS OR SWEATING *NAUSEA AND VOMITING THAT IS NOT CONTROLLED WITH YOUR NAUSEA MEDICATION *UNUSUAL SHORTNESS OF BREATH *UNUSUAL BRUISING OR BLEEDING *URINARY PROBLEMS (pain or burning when urinating, or frequent urination) *BOWEL PROBLEMS (unusual diarrhea, constipation, pain near the anus) TENDERNESS IN MOUTH AND THROAT WITH OR WITHOUT PRESENCE OF ULCERS (sore throat, sores in mouth, or a toothache) UNUSUAL RASH, SWELLING OR PAIN  UNUSUAL VAGINAL DISCHARGE OR ITCHING   Items with * indicate a potential emergency and should be followed up as soon as possible or go to the Emergency Department if any problems should occur.  Please show the CHEMOTHERAPY ALERT CARD or IMMUNOTHERAPY ALERT CARD  at check-in to the Emergency Department and triage nurse.  Should you have questions after your visit or need to cancel or reschedule your appointment, please contact [place] [facility]  Dept: [phone]  and follow the prompts.  Office hours are 8:00 a.m. to 4:30 p.m. Monday - Friday. Please note that voicemails left after 4:00 p.m. may not be returned until the following business day.  We are closed weekends and major holidays. You have access to a nurse at all times for urgent questions. Please call the main number to the clinic Dept: [phone] and follow the prompts.   For any non-urgent questions, you may also contact your provider using MyChart. We now offer e-Visits for anyone 42 and older to request care online for non-urgent symptoms. For details visit [email or web address].   Also download the MyChart app! Go to the app store, search "MyChart", open the app, select [place], and log in with your MyChart username and password.  Gemcitabine Injection What is this medication? GEMCITABINE (jem SYE ta been) treats some types of cancer. It works by slowing down the growth of cancer cells. This medicine may be used for other purposes; ask your health care provider or pharmacist if you have questions. COMMON BRAND NAME(S): Gemzar, Infugem What should I tell my care team before I take this medication? They need to know if you have any of these conditions: Blood disorders Infection Kidney disease Liver disease Lung or breathing disease, such as asthma or COPD Recent or ongoing radiation therapy An unusual or allergic reaction to gemcitabine, other medications, foods, dyes, or preservatives If  you or your partner are pregnant or trying to get pregnant Breast-feeding How should I use this medication? This medication is injected into a vein. It is given by your care team in a hospital or clinic setting. Talk to your care team about the use of this  medication in children. Special care may be needed. Overdosage: If you think you have taken too much of this medicine contact a poison control center or emergency room at once. NOTE: This medicine is only for you. Do not share this medicine with others. What if I miss a dose? Keep appointments for follow-up doses. It is important not to miss your dose. Call your care team if you are unable to keep an appointment. What may interact with this medication? Interactions have not been studied. This list may not describe all possible interactions. Give your health care provider a list of all the medicines, herbs, non-prescription drugs, or dietary supplements you use. Also tell them if you smoke, drink alcohol, or use illegal drugs. Some items may interact with your medicine. What should I watch for while using this medication? Your condition will be monitored carefully while you are receiving this medication. This medication may make you feel generally unwell. This is not uncommon, as chemotherapy can affect healthy cells as well as cancer cells. Report any side effects. Continue your course of treatment even though you feel ill unless your care team tells you to stop. In some cases, you may be given additional medications to help with side effects. Follow all directions for their use. This medication may increase your risk of getting an infection. Call your care team for advice if you get a fever, chills, sore throat, or other symptoms of a cold or flu. Do not treat yourself. Try to avoid being around people who are sick. This medication may increase your risk to bruise or bleed. Call your care team if you notice any unusual bleeding. Be careful brushing or flossing your teeth or using a toothpick because you may get an infection or bleed more easily. If you have any dental work done, tell your dentist you are receiving this medication. Avoid taking medications that contain aspirin, acetaminophen,  ibuprofen, naproxen, or ketoprofen unless instructed by your care team. These medications may hide a fever. Talk to your care team if you or your partner wish to become pregnant or think you might be pregnant. This medication can cause serious birth defects if taken during pregnancy and for 6 months after the last dose. A negative pregnancy test is required before starting this medication. A reliable form of contraception is recommended while taking this medication and for 6 months after the last dose. Talk to your care team about effective forms of contraception. Do not father a child while taking this medication and for 3 months after the last dose. Use a condom while having sex during this time period. Do not breastfeed while taking this medication and for at least 1 week after the last dose. This medication may cause infertility. Talk to your care team if you are concerned about your fertility. What side effects may I notice from receiving this medication? Side effects that you should report to your care team as soon as possible: Allergic reactions--skin rash, itching, hives, swelling of the face, lips, tongue, or throat Capillary leak syndrome--stomach or muscle pain, unusual weakness or fatigue, feeling faint or lightheaded, decrease in the amount of urine, swelling of the ankles, hands, or feet, trouble breathing Infection--fever, chills, cough,  sore throat, wounds that don't heal, pain or trouble when passing urine, general feeling of discomfort or being unwell Liver injury--right upper belly pain, loss of appetite, nausea, light-colored stool, dark yellow or brown urine, yellowing skin or eyes, unusual weakness or fatigue Low red blood cell level--unusual weakness or fatigue, dizziness, headache, trouble breathing Lung injury--shortness of breath or trouble breathing, cough, spitting up blood, chest pain, fever Stomach pain, bloody diarrhea, pale skin, unusual weakness or fatigue, decrease in  the amount of urine, which may be signs of hemolytic uremic syndrome Sudden and severe headache, confusion, change in vision, seizures, which may be signs of posterior reversible encephalopathy syndrome (PRES) Unusual bruising or bleeding Side effects that usually do not require medical attention (report to your care team if they continue or are bothersome): Diarrhea Drowsiness Hair loss Nausea Pain, redness, or swelling with sores inside the mouth or throat Vomiting This list may not describe all possible side effects. Call your doctor for medical advice about side effects. You may report side effects to FDA at 1-800-FDA-1088. Where should I keep my medication? This medication is given in a hospital or clinic. It will not be stored at home. NOTE: This sheet is a summary. It may not cover all possible information. If you have questions about this medicine, talk to your doctor, pharmacist, or health care provider.  2023 Elsevier/Gold Standard (2022-01-27 00:00:00) Rehydration, Adult Rehydration is the replacement of fluids, salts, and minerals in the body (electrolytes) that are lost during dehydration. Dehydration is when there is not enough water or other fluids in the body. This happens when you lose more fluids than you take in. Common causes of dehydration include: Not drinking enough fluids. This can occur when you are ill or doing activities that require a lot of energy, especially in hot weather. Conditions that cause loss of water or other fluids. These include diarrhea, vomiting, sweating, and urinating a lot. Other illnesses, such as fever or infection. Certain medicines, such as those that remove excess fluid from the body (diuretics). Symptoms of mild or moderate dehydration may include thirst, dry lips and mouth, and dizziness. Symptoms of severe dehydration may include increased heart rate, confusion, fainting, and not urinating. In severe cases, you may need to get fluids  through an IV at the hospital. For mild or moderate cases, you can usually rehydrate at home by drinking certain fluids as told by your health care provider. What are the risks? Your health care provider will talk with you about risks. Your health care provider will talk with you about risks. This may include taking in too much fluid (overhydration). This is rare. Overhydration can cause an imbalance of electrolytes in the body, kidney failure, or a decrease in salt (sodium) levels in the body. Supplies needed: You will need an oral rehydration solution (ORS) if your health care provider tells you to use one. This is a drink to treat dehydration. It can be found in pharmacies and retail stores. How to rehydrate Fluids Follow instructions from your health care provider about what to drink. The kind of fluid and the amount you should drink depend on your condition. In general, you should choose drinks that you prefer. If told by your health care provider, drink an ORS. Make an ORS by following instructions on the package. Start by drinking small amounts, about  cup (120 mL) every 5-10 minutes. Slowly increase how much you drink until you have taken in the amount recommended by your health care provider.  Drink enough clear fluids to keep your urine pale yellow. If you were told to drink an ORS, finish it first, then start slowly drinking other clear fluids. Drink fluids such as: Water. This includes sparkling and flavored water. Drinking only water can lead to having too little sodium in your body (hyponatremia). Follow the advice of your health care provider. Water from ice chips you suck on. Fruit juice with water added to it (diluted). Sports drinks. Hot or cold herbal teas. Broth-based soups. Milk or milk products. Food Follow instructions from your health care provider about what to eat while you rehydrate. Your health care provider may recommend that you slowly begin eating regular foods in  small amounts. Eat foods that contain a healthy balance of electrolytes, such as bananas, oranges, potatoes, tomatoes, and spinach. Avoid foods that are greasy or contain a lot of sugar. In some cases, you may get nutrition through a feeding tube that is passed through your nose and into your stomach (nasogastric tube, or NG tube). This may be done if you have uncontrolled vomiting or diarrhea. Drinks to avoid  Certain drinks may make dehydration worse. While you rehydrate, avoid drinking alcohol. How to tell if you are recovering from dehydration You may be getting better if: You are urinating more often than before you started rehydrating. Your urine is pale yellow. Your energy level improves. You vomit less often. You have diarrhea less often. Your appetite improves or returns to normal. You feel less dizzy or light-headed. Your skin tone and color start to look more normal. Follow these instructions at home: Take over-the-counter and prescription medicines only as told by your health care provider. Do not take sodium tablets. Doing this can lead to having too much sodium in your body (hypernatremia). Contact a health care provider if: You continue to have symptoms of mild or moderate dehydration, such as: Thirst. Dry lips. Slightly dry mouth. Dizziness. Dark urine or less urine than normal. Muscle cramps. You continue to vomit or have diarrhea. Get help right away if: You have symptoms of dehydration that get worse. You have a fever. You have a severe headache. You have been vomiting and have problems, such as: Your vomiting gets worse or does not go away. Your vomit includes blood or green matter (bile). You cannot eat or drink without vomiting. You have problems with urination or bowel movements, such as: Diarrhea that gets worse or does not go away. Blood in your stool (feces). This may cause stool to look black and tarry. Not urinating, or urinating only a small amount  of very dark urine, within 6-8 hours. You have trouble breathing. You have symptoms that get worse with treatment. These symptoms may be an emergency. Get help right away. Call 911. Do not wait to see if the symptoms will go away. Do not drive yourself to the hospital. This information is not intended to replace advice given to you by your health care provider. Make sure you discuss any questions you have with your health care provider. Document Revised: 02/02/2022 Document Reviewed: 02/02/2022 Elsevier Patient Education  2023 [employer].

## 2023-02-19 NOTE — Assessment & Plan Note (Signed)
He has minimum pain now that his disease is under control He has pain medicine to take as needed

## 2023-02-20 ENCOUNTER — Other Ambulatory Visit: Payer: Self-pay

## 2023-02-22 ENCOUNTER — Telehealth: Payer: Self-pay | Admitting: Hematology and Oncology

## 2023-02-22 NOTE — Telephone Encounter (Signed)
Scheduled appointment per WQ. Left voicemail for patient for 5/30 added on appointments.

## 2023-02-24 ENCOUNTER — Other Ambulatory Visit: Payer: Self-pay | Admitting: Physician Assistant

## 2023-02-24 DIAGNOSIS — C760 Malignant neoplasm of head, face and neck: Secondary | ICD-10-CM

## 2023-02-25 ENCOUNTER — Other Ambulatory Visit: Payer: Self-pay | Admitting: Hematology and Oncology

## 2023-02-25 ENCOUNTER — Telehealth: Payer: Self-pay

## 2023-02-25 ENCOUNTER — Other Ambulatory Visit (HOSPITAL_BASED_OUTPATIENT_CLINIC_OR_DEPARTMENT_OTHER): Payer: Self-pay

## 2023-02-25 DIAGNOSIS — C760 Malignant neoplasm of head, face and neck: Secondary | ICD-10-CM

## 2023-02-25 MED ORDER — METHADONE HCL 10 MG PO TABS
10.0000 mg | ORAL_TABLET | Freq: Two times a day (BID) | ORAL | 0 refills | Status: DC
Start: 2023-02-25 — End: 2023-03-19
  Filled 2023-02-25 – 2023-02-26 (×2): qty 60, 30d supply, fill #0

## 2023-02-25 MED ORDER — OXYCODONE HCL 10 MG PO TABS
10.0000 mg | ORAL_TABLET | ORAL | 0 refills | Status: DC | PRN
  Filled 2023-02-25: qty 90, 15d supply, fill #0

## 2023-02-25 NOTE — Telephone Encounter (Signed)
Pt called requesting med refills for oxycodone and methadone to [facility] pharmacy. Message sent to MD for refill.

## 2023-02-25 NOTE — Telephone Encounter (Signed)
Called and relayed to Pt that MD has refilled oxycodone and methadone rx. Pt verbalized understanding.

## 2023-02-26 ENCOUNTER — Other Ambulatory Visit (HOSPITAL_BASED_OUTPATIENT_CLINIC_OR_DEPARTMENT_OTHER): Payer: Self-pay

## 2023-02-26 ENCOUNTER — Other Ambulatory Visit (HOSPITAL_COMMUNITY): Payer: Self-pay

## 2023-03-03 MED FILL — Dexamethasone Sodium Phosphate Inj 100 MG/10ML: INTRAMUSCULAR | Qty: 1 | Status: AC

## 2023-03-03 MED FILL — Fosaprepitant Dimeglumine For IV Infusion 150 MG (Base Eq): INTRAVENOUS | Qty: 5 | Status: AC

## 2023-03-04 ENCOUNTER — Inpatient Hospital Stay: Payer: Medicaid Other

## 2023-03-04 VITALS — BP 144/95 | HR 85 | Temp 98.2°F | Resp 16 | Ht 69.0 in | Wt 187.0 lb

## 2023-03-04 DIAGNOSIS — Z5111 Encounter for antineoplastic chemotherapy: Secondary | ICD-10-CM | POA: Diagnosis not present

## 2023-03-04 DIAGNOSIS — C760 Malignant neoplasm of head, face and neck: Secondary | ICD-10-CM

## 2023-03-04 DIAGNOSIS — Z95828 Presence of other vascular implants and grafts: Secondary | ICD-10-CM

## 2023-03-04 DIAGNOSIS — Z7189 Other specified counseling: Secondary | ICD-10-CM

## 2023-03-04 DIAGNOSIS — C7800 Secondary malignant neoplasm of unspecified lung: Secondary | ICD-10-CM

## 2023-03-04 LAB — CMP (CANCER CENTER ONLY)
ALT: 23 U/L (ref 0–44)
AST: 20 U/L (ref 15–41)
Albumin: 4.1 g/dL (ref 3.5–5.0)
Alkaline Phosphatase: 71 U/L (ref 38–126)
Anion gap: 7 (ref 5–15)
BUN: 10 mg/dL (ref 6–20)
CO2: 30 mmol/L (ref 22–32)
Calcium: 8.6 mg/dL — ABNORMAL LOW (ref 8.9–10.3)
Chloride: 103 mmol/L (ref 98–111)
Creatinine: 0.78 mg/dL (ref 0.61–1.24)
GFR, Estimated: >60 mL/min (ref >60)
Glucose, Bld: 189 mg/dL — ABNORMAL HIGH (ref 70–99)
Potassium: 3.5 mmol/L (ref 3.5–5.1)
Sodium: 140 mmol/L (ref 135–145)
Total Bilirubin: 0.3 mg/dL (ref 0.3–1.2)
Total Protein: 6.8 g/dL (ref 6.5–8.1)

## 2023-03-04 LAB — CBC WITH DIFFERENTIAL (CANCER CENTER ONLY)
Abs Immature Granulocytes: 0.01 10*3/uL (ref 0.00–0.07)
Basophils Absolute: 0 10*3/uL (ref 0.0–0.1)
Basophils Relative: 0 %
Eosinophils Absolute: 0.1 10*3/uL (ref 0.0–0.5)
Eosinophils Relative: 3 %
HCT: 32.2 % — ABNORMAL LOW (ref 39.0–52.0)
Hemoglobin: 10.4 g/dL — ABNORMAL LOW (ref 13.0–17.0)
Immature Granulocytes: 0 %
Lymphocytes Relative: 26 %
Lymphs Abs: 1.2 10*3/uL (ref 0.7–4.0)
MCH: 29 pg (ref 26.0–34.0)
MCHC: 32.3 g/dL (ref 30.0–36.0)
MCV: 89.7 fL (ref 80.0–100.0)
Monocytes Absolute: 0.6 10*3/uL (ref 0.1–1.0)
Monocytes Relative: 12 %
Neutro Abs: 2.7 10*3/uL (ref 1.7–7.7)
Neutrophils Relative %: 59 %
Platelet Count: 179 10*3/uL (ref 150–400)
RBC: 3.59 MIL/uL — ABNORMAL LOW (ref 4.22–5.81)
RDW: 16.1 % — ABNORMAL HIGH (ref 11.5–15.5)
WBC Count: 4.6 10*3/uL (ref 4.0–10.5)
nRBC: 0 % (ref 0.0–0.2)

## 2023-03-04 LAB — MAGNESIUM: Magnesium: 1.9 mg/dL (ref 1.7–2.4)

## 2023-03-04 MED ORDER — SODIUM CHLORIDE 0.9% FLUSH
10.0000 mL | INTRAVENOUS | Status: DC | PRN
  Administered 2023-03-04: 10 mL

## 2023-03-04 MED ORDER — TORIPALIMAB-TPZI CHEMO INJECTION 240 MG/6ML
240.0000 mg | Freq: Once | INTRAVENOUS | Status: AC
  Administered 2023-03-04: 240 mg via INTRAVENOUS
  Filled 2023-03-04: qty 6

## 2023-03-04 MED ORDER — SODIUM CHLORIDE 0.9 % IV SOLN: Freq: Once | INTRAVENOUS | Status: AC

## 2023-03-04 MED ORDER — SODIUM CHLORIDE 0.9 % IV SOLN
10.0000 mg | Freq: Once | INTRAVENOUS | Status: AC
  Administered 2023-03-04: 10 mg via INTRAVENOUS
  Filled 2023-03-04: qty 10

## 2023-03-04 MED ORDER — PALONOSETRON HCL INJECTION 0.25 MG/5ML
0.2500 mg | Freq: Once | INTRAVENOUS | Status: AC
  Administered 2023-03-04: 0.25 mg via INTRAVENOUS
  Filled 2023-03-04: qty 5

## 2023-03-04 MED ORDER — SODIUM CHLORIDE 0.9 % IV SOLN
750.0000 mg/m2 | Freq: Once | INTRAVENOUS | Status: AC
  Administered 2023-03-04: 1520 mg via INTRAVENOUS
  Filled 2023-03-04: qty 39.98

## 2023-03-04 MED ORDER — POTASSIUM CHLORIDE IN NACL 20-0.9 MEQ/L-% IV SOLN
Freq: Once | INTRAVENOUS | Status: AC
  Filled 2023-03-04: qty 1000

## 2023-03-04 MED ORDER — SODIUM CHLORIDE 0.9 % IV SOLN
150.0000 mg | Freq: Once | INTRAVENOUS | Status: AC
  Administered 2023-03-04: 150 mg via INTRAVENOUS
  Filled 2023-03-04: qty 150

## 2023-03-04 MED ORDER — SODIUM CHLORIDE 0.9% FLUSH
10.0000 mL | Freq: Once | INTRAVENOUS | Status: AC
  Administered 2023-03-04: 10 mL

## 2023-03-04 MED ORDER — SODIUM CHLORIDE 0.9 % IV SOLN
40.0000 mg/m2 | Freq: Once | INTRAVENOUS | Status: AC
  Administered 2023-03-04: 82 mg via INTRAVENOUS
  Filled 2023-03-04: qty 82

## 2023-03-04 MED ORDER — MAGNESIUM SULFATE 2 GM/50ML IV SOLN
2.0000 g | Freq: Once | INTRAVENOUS | Status: AC
  Administered 2023-03-04: 2 g via INTRAVENOUS
  Filled 2023-03-04: qty 50

## 2023-03-04 NOTE — Patient Instructions (Signed)
[place] [facility]  Discharge Instructions: Thank you for choosing [facility] to provide your oncology and hematology care.   If you have a lab appointment with the Cancer Center, please go directly to the Cancer Center and check in at the registration area.   Wear comfortable clothing and clothing appropriate for easy access to any Portacath or PICC line.   We strive to give you quality time with your provider. You may need to reschedule your appointment if you arrive late (15 or more minutes).  Arriving late affects you and other patients whose appointments are after yours.  Also, if you miss three or more appointments without notifying the office, you may be dismissed from the clinic at the provider's discretion.      For prescription refill requests, have your pharmacy contact our office and allow 72 hours for refills to be completed.    Today you received the following chemotherapy and/or immunotherapy agents: Loqtorzi, Cisplatin, Gemcitabine.      To help prevent nausea and vomiting after your treatment, we encourage you to take your nausea medication as directed.  BELOW ARE SYMPTOMS THAT SHOULD BE REPORTED IMMEDIATELY: *FEVER GREATER THAN 100.4 F (38 C) OR HIGHER *CHILLS OR SWEATING *NAUSEA AND VOMITING THAT IS NOT CONTROLLED WITH YOUR NAUSEA MEDICATION *UNUSUAL SHORTNESS OF BREATH *UNUSUAL BRUISING OR BLEEDING *URINARY PROBLEMS (pain or burning when urinating, or frequent urination) *BOWEL PROBLEMS (unusual diarrhea, constipation, pain near the anus) TENDERNESS IN MOUTH AND THROAT WITH OR WITHOUT PRESENCE OF ULCERS (sore throat, sores in mouth, or a toothache) UNUSUAL RASH, SWELLING OR PAIN  UNUSUAL VAGINAL DISCHARGE OR ITCHING   Items with * indicate a potential emergency and should be followed up as soon as possible or go to the Emergency Department if any problems should occur.  Please show the CHEMOTHERAPY ALERT CARD or  IMMUNOTHERAPY ALERT CARD at check-in to the Emergency Department and triage nurse.  Should you have questions after your visit or need to cancel or reschedule your appointment, please contact [facility]  Dept: [phone]  and follow the prompts.  Office hours are 8:00 a.m. to 4:30 p.m. Monday - Friday. Please note that voicemails left after 4:00 p.m. may not be returned until the following business day.  We are closed weekends and major holidays. You have access to a nurse at all times for urgent questions. Please call the main number to the clinic Dept: [phone] and follow the prompts.   For any non-urgent questions, you may also contact your provider using MyChart. We now offer e-Visits for anyone 18 and older to request care online for non-urgent symptoms. For details visit [email or web address].   Also download the MyChart app! Go to the app store, search "MyChart", open the app, select [place], and log in with your MyChart username and password.   

## 2023-03-04 NOTE — Progress Notes (Signed)
Per Bertis Ruddy, MD, okay to run IV Potassium concurrently with Cisplatin

## 2023-03-05 ENCOUNTER — Other Ambulatory Visit: Payer: Medicaid Other

## 2023-03-05 ENCOUNTER — Ambulatory Visit: Payer: Medicaid Other

## 2023-03-11 ENCOUNTER — Other Ambulatory Visit (HOSPITAL_BASED_OUTPATIENT_CLINIC_OR_DEPARTMENT_OTHER): Payer: Self-pay

## 2023-03-11 ENCOUNTER — Other Ambulatory Visit: Payer: Self-pay

## 2023-03-11 ENCOUNTER — Other Ambulatory Visit: Payer: Self-pay | Admitting: Hematology and Oncology

## 2023-03-11 ENCOUNTER — Telehealth: Payer: Self-pay

## 2023-03-11 MED ORDER — FAMOTIDINE 40 MG PO TABS
40.0000 mg | ORAL_TABLET | Freq: Every day | ORAL | 3 refills | Status: DC
  Filled 2023-03-11: qty 30, 30d supply, fill #0
  Filled 2023-06-02: qty 30, 30d supply, fill #1

## 2023-03-11 MED ORDER — PANTOPRAZOLE SODIUM 40 MG PO TBEC
40.0000 mg | DELAYED_RELEASE_TABLET | Freq: Every day | ORAL | 2 refills | Status: DC
  Filled 2023-03-11: qty 30, 30d supply, fill #0
  Filled 2023-05-05: qty 30, 30d supply, fill #1
  Filled 2023-06-02: qty 30, 30d supply, fill #2

## 2023-03-11 NOTE — Telephone Encounter (Signed)
Returned his call. He has had heartburn really bad since last treatment. He normally just has heartburn for 3 days after treatment. It almost feels like a ulcer, per Roseanne Reno. He is having nausea and hiccups at times. He has tried omeprazole 20 mg and tum's and it is not helping. Instructed to keep taking OTC and the office will call him back.  He is asking if Dr. Bertis Ruddy can send Rx to [facility]. If his pharmacy will not cover he will pay for the Rx.

## 2023-03-11 NOTE — Telephone Encounter (Signed)
Called and given below message. He verbalized understanding and appreciated the call. 

## 2023-03-11 NOTE — Telephone Encounter (Signed)
1) continue tums 2) I will prescribe 40 mg pantoprazole in the morning and 40 mg pepcid at bed time

## 2023-03-16 ENCOUNTER — Other Ambulatory Visit (HOSPITAL_BASED_OUTPATIENT_CLINIC_OR_DEPARTMENT_OTHER): Payer: Self-pay

## 2023-03-19 ENCOUNTER — Inpatient Hospital Stay: Payer: Medicaid Other | Attending: Hematology and Oncology | Admitting: Hematology and Oncology

## 2023-03-19 ENCOUNTER — Other Ambulatory Visit: Payer: Self-pay | Admitting: Hematology and Oncology

## 2023-03-19 ENCOUNTER — Other Ambulatory Visit: Payer: Self-pay

## 2023-03-19 ENCOUNTER — Other Ambulatory Visit (HOSPITAL_BASED_OUTPATIENT_CLINIC_OR_DEPARTMENT_OTHER): Payer: Self-pay

## 2023-03-19 ENCOUNTER — Inpatient Hospital Stay: Payer: Medicaid Other

## 2023-03-19 ENCOUNTER — Encounter: Payer: Self-pay | Admitting: Hematology and Oncology

## 2023-03-19 VITALS — BP 134/92 | HR 94 | Temp 98.1°F | Resp 18 | Ht 69.0 in | Wt 190.6 lb

## 2023-03-19 DIAGNOSIS — Z95828 Presence of other vascular implants and grafts: Secondary | ICD-10-CM

## 2023-03-19 DIAGNOSIS — Z5111 Encounter for antineoplastic chemotherapy: Secondary | ICD-10-CM | POA: Diagnosis present

## 2023-03-19 DIAGNOSIS — G893 Neoplasm related pain (acute) (chronic): Secondary | ICD-10-CM

## 2023-03-19 DIAGNOSIS — D6481 Anemia due to antineoplastic chemotherapy: Secondary | ICD-10-CM | POA: Diagnosis not present

## 2023-03-19 DIAGNOSIS — C760 Malignant neoplasm of head, face and neck: Secondary | ICD-10-CM

## 2023-03-19 DIAGNOSIS — Z79899 Other long term (current) drug therapy: Secondary | ICD-10-CM | POA: Insufficient documentation

## 2023-03-19 DIAGNOSIS — Z7189 Other specified counseling: Secondary | ICD-10-CM

## 2023-03-19 DIAGNOSIS — C7800 Secondary malignant neoplasm of unspecified lung: Secondary | ICD-10-CM

## 2023-03-19 DIAGNOSIS — T451X5A Adverse effect of antineoplastic and immunosuppressive drugs, initial encounter: Secondary | ICD-10-CM

## 2023-03-19 DIAGNOSIS — C7989 Secondary malignant neoplasm of other specified sites: Secondary | ICD-10-CM | POA: Insufficient documentation

## 2023-03-19 DIAGNOSIS — C119 Malignant neoplasm of nasopharynx, unspecified: Secondary | ICD-10-CM | POA: Insufficient documentation

## 2023-03-19 LAB — CMP (CANCER CENTER ONLY)
ALT: 20 U/L (ref 0–44)
AST: 17 U/L (ref 15–41)
Albumin: 3.9 g/dL (ref 3.5–5.0)
Alkaline Phosphatase: 73 U/L (ref 38–126)
Anion gap: 6 (ref 5–15)
BUN: 12 mg/dL (ref 6–20)
CO2: 30 mmol/L (ref 22–32)
Calcium: 9.2 mg/dL (ref 8.9–10.3)
Chloride: 103 mmol/L (ref 98–111)
Creatinine: 0.78 mg/dL (ref 0.61–1.24)
GFR, Estimated: >60 mL/min (ref >60)
Glucose, Bld: 158 mg/dL — ABNORMAL HIGH (ref 70–99)
Potassium: 3.8 mmol/L (ref 3.5–5.1)
Sodium: 139 mmol/L (ref 135–145)
Total Bilirubin: 0.3 mg/dL (ref 0.3–1.2)
Total Protein: 6.3 g/dL — ABNORMAL LOW (ref 6.5–8.1)

## 2023-03-19 LAB — CBC WITH DIFFERENTIAL (CANCER CENTER ONLY)
Abs Immature Granulocytes: 0.01 10*3/uL (ref 0.00–0.07)
Basophils Absolute: 0 10*3/uL (ref 0.0–0.1)
Basophils Relative: 0 %
Eosinophils Absolute: 0.2 10*3/uL (ref 0.0–0.5)
Eosinophils Relative: 4 %
HCT: 33.1 % — ABNORMAL LOW (ref 39.0–52.0)
Hemoglobin: 10.8 g/dL — ABNORMAL LOW (ref 13.0–17.0)
Immature Granulocytes: 0 %
Lymphocytes Relative: 28 %
Lymphs Abs: 1.3 10*3/uL (ref 0.7–4.0)
MCH: 29.5 pg (ref 26.0–34.0)
MCHC: 32.6 g/dL (ref 30.0–36.0)
MCV: 90.4 fL (ref 80.0–100.0)
Monocytes Absolute: 0.5 10*3/uL (ref 0.1–1.0)
Monocytes Relative: 11 %
Neutro Abs: 2.6 10*3/uL (ref 1.7–7.7)
Neutrophils Relative %: 57 %
Platelet Count: 327 10*3/uL (ref 150–400)
RBC: 3.66 MIL/uL — ABNORMAL LOW (ref 4.22–5.81)
RDW: 15.9 % — ABNORMAL HIGH (ref 11.5–15.5)
WBC Count: 4.6 10*3/uL (ref 4.0–10.5)
nRBC: 0 % (ref 0.0–0.2)

## 2023-03-19 MED ORDER — METHADONE HCL 10 MG PO TABS
10.0000 mg | ORAL_TABLET | Freq: Two times a day (BID) | ORAL | 0 refills | Status: DC
Start: 2023-03-19 — End: 2023-04-16
  Filled 2023-03-19 – 2023-03-26 (×2): qty 60, 30d supply, fill #0

## 2023-03-19 MED ORDER — ONDANSETRON HCL 8 MG PO TABS
8.0000 mg | ORAL_TABLET | Freq: Once | ORAL | Status: AC
  Administered 2023-03-19: 8 mg via ORAL
  Filled 2023-03-19: qty 1

## 2023-03-19 MED ORDER — SODIUM CHLORIDE 0.9% FLUSH
10.0000 mL | INTRAVENOUS | Status: DC | PRN
  Administered 2023-03-19: 10 mL

## 2023-03-19 MED ORDER — SODIUM CHLORIDE 0.9 % IV SOLN: Freq: Once | INTRAVENOUS | Status: AC

## 2023-03-19 MED ORDER — SODIUM CHLORIDE 0.9% FLUSH
10.0000 mL | Freq: Once | INTRAVENOUS | Status: AC
  Administered 2023-03-19: 10 mL

## 2023-03-19 MED ORDER — OXYCODONE HCL 10 MG PO TABS
10.0000 mg | ORAL_TABLET | ORAL | 0 refills | Status: DC | PRN
  Filled 2023-03-19: qty 90, 15d supply, fill #0

## 2023-03-19 MED ORDER — SODIUM CHLORIDE 0.9 % IV SOLN
750.0000 mg/m2 | Freq: Once | INTRAVENOUS | Status: AC
  Administered 2023-03-19: 1520 mg via INTRAVENOUS
  Filled 2023-03-19: qty 39.98

## 2023-03-19 MED ORDER — HEPARIN SOD (PORK) LOCK FLUSH 100 UNIT/ML IV SOLN: 500.0000 [IU] | Freq: Once | INTRAVENOUS | Status: DC | PRN

## 2023-03-19 MED ORDER — SODIUM CHLORIDE 0.9 % IV SOLN: Freq: Once | INTRAVENOUS | Status: DC

## 2023-03-19 NOTE — Assessment & Plan Note (Signed)
This is likely due to recent treatment. The patient denies recent history of bleeding such as epistaxis, hematuria or hematochezia. He is asymptomatic from the anemia. I will observe for now.  He does not require transfusion now. I will continue the chemotherapy at current dose without dosage adjustment.  If the anemia gets progressive worse in the future, I might have to delay his treatment or adjust the chemotherapy dose.  

## 2023-03-19 NOTE — Progress Notes (Signed)
[facility] OFFICE PROGRESS NOTE  Patient Care Team: Artis Delay, MD as PCP - General (Hematology and Oncology) Artis Delay, MD as Consulting Physician (Hematology and Oncology) Elfredia Nevins, MD as Referring Physician (Plastic Surgery) Hilarie Fredrickson, MD as Consulting Physician (Gastroenterology)  ASSESSMENT & PLAN:  Malignant neoplasm of head and neck [facility]) He has intermittent clinical improvement to the mass on the right side of his neck with treatment He denies neuropathy We will continue current dose without dose adjustment  Cancer associated pain He has minimum pain now that his disease is under control He has pain medicine to take as needed I refilled his prescriptions today  Anemia due to antineoplastic chemotherapy This is likely due to recent treatment. The patient denies recent history of bleeding such as epistaxis, hematuria or hematochezia. He is asymptomatic from the anemia. I will observe for now.  He does not require transfusion now. I will continue the chemotherapy at current dose without dosage adjustment.  If the anemia gets progressive worse in the future, I might have to delay his treatment or adjust the chemotherapy dose.   No orders of the defined types were placed in this encounter.   All questions were answered. The patient knows to call the clinic with any problems, questions or concerns. The total time spent in the appointment was 20 minutes encounter with patients including review of chart and various tests results, discussions about plan of care and coordination of care plan   Artis Delay, MD 03/19/2023 9:49 AM  INTERVAL HISTORY: Please see below for problem oriented charting. he returns for treatment follow-up He tolerated recent treatment well No tinnitus, hearing deficit no peripheral neuropathy Denies nausea His chronic pain is stable  REVIEW OF SYSTEMS:   Constitutional: Denies fevers, chills or abnormal weight loss Eyes:  Denies blurriness of vision Ears, nose, mouth, throat, and face: Denies mucositis or sore throat Respiratory: Denies cough, dyspnea or wheezes Cardiovascular: Denies palpitation, chest discomfort or lower extremity swelling Gastrointestinal:  Denies nausea, heartburn or change in bowel habits Skin: Denies abnormal skin rashes Lymphatics: Denies new lymphadenopathy or easy bruising Neurological:Denies numbness, tingling or new weaknesses Behavioral/Psych: Mood is stable, no new changes  All other systems were reviewed with the patient and are negative.  I have reviewed the past medical history, past surgical history, social history and family history with the patient and they are unchanged from previous note.  ALLERGIES:  is allergic to phenergan [promethazine hcl], heparin, and clindamycin.  MEDICATIONS:  Current Outpatient Medications  Medication Sig Dispense Refill   blood glucose meter kit and supplies KIT Dispense based on patient and insurance preference. Use up to four times daily as directed. (FOR ICD-9 250.00, 250.01). 1 each 1   cetirizine (ZYRTEC) 10 MG tablet Take 1 tablet (10 mg total) by mouth daily as needed for allergies. 100 tablet 0   dexamethasone (DECADRON) 4 MG tablet Take 1 tablet (4 mg total) by mouth daily for 3 days after chemo, every 2 weeks. 30 tablet 0   famotidine (PEPCID) 40 MG tablet Take 1 tablet (40 mg total) by mouth at bedtime. 30 tablet 3   levothyroxine (SYNTHROID) 175 MCG tablet Take 1 tablet (175 mcg total) by mouth daily before breakfast. 30 tablet 3   LORazepam (ATIVAN) 0.5 MG tablet Take 1 tablet (0.5 mg total) by mouth 2 (two) times daily as needed for anxiety. 30 tablet 0   metFORMIN (GLUCOPHAGE) 500 MG tablet Take 1 tablet (500 mg total)  by mouth 2 (two) times daily with a meal. 60 tablet 2   methadone (DOLOPHINE) 10 MG tablet Take 1 tablet (10 mg total) by mouth every 12 (twelve) hours. 60 tablet 0   ondansetron (ZOFRAN) 8 MG tablet Take 1 tablet  (8 mg total) by mouth every 8 (eight) hours as needed for nausea or vomiting. 90 tablet 1   Oxycodone HCl 10 MG TABS Take 1 tablet (10 mg total) by mouth every 4 (four) hours as needed. 90 tablet 0   pantoprazole (PROTONIX) 40 MG tablet Take 1 tablet (40 mg total) by mouth daily. 30 tablet 2   polyethylene glycol (MIRALAX / GLYCOLAX) 17 g packet Take 17 g by mouth 2 (two) times daily.     senna-docusate (SENOKOT-S) 8.6-50 MG tablet Take 2 tablets by mouth 2 (two) times daily. 100 tablet 3   No current facility-administered medications for this visit.   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 Pan-endoscopy elsewhere showed cancer from nasopharyngeal space.   01/04/2007 - 02/20/2007 Chemotherapy   He received 2 cycles of cisplatin and 5FU followed by concurrent chemo with weekly cisplatin and radiation. He only received 2 doses of chemo due to severe mucositis, nausea and weight loss.   04/05/2007 - 08/04/2007 Chemotherapy   He received 4 more courses of cisplatin with 5FU and had complete response   07/05/2009 Procedure   Fine-needle aspirate of the right level II  lymph nodes come from recurrent metastatic disease. Repeat endoscopy and CT scan show no evidence of disease elsewhere.   07/08/2009 - 12/02/2009 Chemotherapy   He was given 6 cycles of carboplatin, 5-FU and docetaxel   12/03/2009 Surgery   He has surgery to the residual lymph node on the right neck which showed no evidence of disease.   02/22/2012 Imaging   Repeat imaging study showed large recurrent mass. He was referred elsewhere for further treatment.   05/03/2012 Surgery   He underwent left upper lobectomy.   04/29/2013 Imaging   PEt scan showed lesion on right level II B and lower lung was abnormal   06/03/2013 - 02/02/2014 Chemotherapy   He had 6 cycles of chemotherapy when he was found to have recurrence of cancer and had received oxaliplatin and capecitabine   06/07/2014 Imaging   PET CT scan showed persistent disease in the right neck lymph nodes and left lung   06/29/2014 Procedure   Accession: [id number] repeat LUL biopsy confirmed metastatic cancer   07/18/2014 - 07/31/2014 Radiation Therapy   He received palliative radiation therapy to the lungs   10/10/2014 Imaging   CT  scan of the chest, abdomen and pelvis show regression in the size of the lung nodule in the left upper lobe and stable pulmonary nodules   01/24/2015 Imaging   CT scan showed stable disease in neck and lung   06/19/2015 Imaging   CT scan of the neck and the chest show possible mild progression of the nodule in the right side of the neck.   06/25/2015 Imaging   PET scan confirmed disease recurrence in the neck   07/07/2015 Imaging   He had MRI neck [facility]   09/03/2015 - 08/26/2018 Chemotherapy   He received palliative chemo with Nivolumab   10/29/2015 Imaging   PET CT showed positive response to Rx   02/28/2016 Imaging   Ct abdomen showed abnormal thinkening in his stomach   03/03/2016 Imaging   CT: Right sternocleidomastoid muscle metastasis appears less distinct but otherwise not significantly changed  in size or configuration since 06/19/2015.2. Left level 3 lymph node which was hypermetabolic by PET-CT in January 2017 appears slightly smaller   04/01/2016 Imaging   CT cervical spine showed no acute fracture or traumatic malalignment in the cervical spine   04/22/2016 Procedure   Port-a-cath placed.   06/16/2016 Imaging   Ct neck showed right sternocleidomastoid muscle metastasis is further decreased in conspicuity since May, and has mildly decreased in size since September 2016. Continued stability of sub-centimeter left cervical lymph nodes. No new or progressive metastatic disease in the neck.   06/16/2016 Imaging   CT chest showed stable masslike radiation fibrosis in the left upper lobe. Stable subcentimeter pulmonary nodules in the bilateral lower lobes. No new or progressive metastatic disease in the chest. Nonobstructing left renal stone.   10/13/2016 Imaging   Ct neck showed unchanged right sternocleidomastoid muscle metastasis. Unchanged subcentimeter left cervical lymph nodes. No evidence of new or progressive metastatic disease in the neck.   10/13/2016 Imaging   CT chest showed tiny hypervascular foci in the liver, not definitely seen on prior imaging of 06/16/2016 and 02/28/2016. Abdomen MRI without and with contrast recommended to further evaluate as metastatic disease is a concern. 2. Stable appearance of post treatment changes left upper lung and scattered tiny bilateral pulmonary nodules.   02/11/2017 Imaging   Ct neck: Lymph node mass right posterior neck appears improved from the prior study. Small posterior lymph nodes on the left unchanged. Occluded right jugular vein unchanged.   02/11/2017 Imaging   1. Similar appearance of postsurgical and radiation changes in the left upper lobe. 2. Similar bilateral pulmonary nodules. 3. No thoracic adenopathy. 4. Subtle foci of post-contrast enhancement within the liver are suboptimally characterized on this nondedicated study.  Likely similar. These could either be re-evaluated at followup or more entirely characterized with abdominal MRI. 5. Left nephrolithiasis.   05/19/2017 Imaging   Matted lymph node mass right posterior neck appears larger in the recent CT. Accurate measurements difficult due to infiltrating tumor margins and infiltration of the muscle. Right jugular vein again appears occluded or resected. Small left posterior lymph nodes stable. Left upper lobe airspace density stable and similar to the prior CT   06/03/2017 PET scan   1. Hypermetabolic ill-defined right level IIb lymph node, about 1.3 cm in diameter with maximum SUV 9.5 (formerly 8.1). Appearance suspicious for residual/recurrent malignancy. No worrisome left-sided lesion. 2. Left suprahilar indistinct opacity demonstrates no worrisome hypermetabolic activity. The 5 mm left lower lobe pulmonary nodule is stable and not currently hypermetabolic although below sensitive PET-CT size thresholds. 3.  Other imaging findings of potential clinical significance: Bilateral nonobstructive nephrolithiasis. Chronic bilateral maxillary sinusitis.   05/11/2018 PET scan   1. Continued chronic accentuated metabolic activity in the vicinity of right level IIB and the adjacent right sternocleidomastoid muscle, with ill definition of surrounding tissue planes. Maximum SUV is currently 8.1, formerly 9.5. Accentuated metabolic activity is been present in this vicinity back through 06/25/2015, and there was also some low-level activity in this vicinity on 06/07/2014. Some of this may be from scarring and local muscular activity although clearly a component of residual tumor is difficult to exclude given the focally high activity. 2. Other imaging findings of potential clinical significance: Chronic bilateral maxillary sinusitis. Chronic scarring in the left upper lobe. Chronically stable 5 mm left lower lobe nodule is considered benign. Nonobstructive left nephrolithiasis.    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 COMMENT:No significant keratinization is identified. Other basaloid carcinomas are in the differential diagnosis. No cell block material is available for further testing.   09/12/2018 Procedure   He underwent fine Needle Aspiration   10/04/2018 PET scan   1. Significant progression of local recurrence laterally in the mid right neck with an enlarging, increasingly hypermetabolic soft tissue mass. This involves the right sternocleidomastoid muscle. 2. Small lymph nodes in the right axilla are increasingly hypermetabolic. These are nonspecific and potentially reactive, although could reflect a small metastases. Small hypermetabolic nodule in the left suprasternal notch is unchanged. 3. No other evidence of metastatic disease.     10/07/2018 - 12/23/2018 Chemotherapy   The patient had cisplatin plus gemzar   12/07/2018 Imaging   1. Decreased size of lateral right neck mass. 2. Unchanged soft tissue nodule in the suprasternal notch. 3. No evidence of new metastatic disease in the neck.     05/30/2019 Imaging   CT neck No clear change or progression compared to the study of March. Overall measurements of the right lateral neck mass are similar, approximately 3 x 1.8 cm. See above discussion. One could argue that there is slight increase in lateral bulging, possibly with an increase in contrast enhancement, towards the inferior margin. This is of questionable validity but could possibly represent some progression or inflammatory change. Other findings in the region are stable.   07/20/2019 - 09/15/2019 Chemotherapy   The patient had dexamethasone (DECADRON) 4 MG tablet, 1 of 1 cycle, Start date: --, End date: -- palonosetron (ALOXI) injection 0.25 mg, 0.25 mg, Intravenous,  Once, 3 of 4 cycles Administration:  0.25 mg (07/20/2019), 0.25 mg (08/10/2019), 0.25 mg (09/08/2019) CISplatin (PLATINOL) 84 mg in sodium chloride 0.9 % 250 mL chemo infusion, 40 mg/m2 = 84 mg (80 % of original dose 50 mg/m2), Intravenous,  Once, 3 of 4 cycles Dose modification: 40 mg/m2 (80 % of original dose 50 mg/m2, Cycle 1, Reason: Dose Not Tolerated) Administration: 84 mg (07/20/2019), 84 mg (08/10/2019), 83 mg (09/08/2019) gemcitabine (GEMZAR) 1,600 mg in sodium chloride 0.9 % 250 mL chemo infusion, 1,672 mg (80 % of original dose 1,000 mg/m2), Intravenous,  Once, 3 of 4 cycles Dose modification: 800 mg/m2 (80 % of original dose 1,000 mg/m2, Cycle 1, Reason: Provider Judgment) Administration: 1,600 mg (07/20/2019), 1,600 mg (07/27/2019), 1,600 mg (08/10/2019), 1,600 mg (08/17/2019), 1,600 mg (09/08/2019), 1,672 mg (09/15/2019) ondansetron (ZOFRAN) 8 mg, dexamethasone (DECADRON) 10 mg in sodium chloride 0.9 % 50 mL IVPB, ,  Intravenous,  Once, 3 of 4 cycles Administration:  (09/15/2019) fosaprepitant (EMEND) 150 mg, dexamethasone (DECADRON) 12 mg in sodium chloride 0.9 % 145 mL IVPB, , Intravenous,  Once, 3 of 4 cycles Administration:  (07/20/2019),  (08/10/2019),  (09/08/2019)  for chemotherapy treatment.    10/02/2019 Imaging   CT neck As compared to 05/30/2019, no significant interval change in size of an ill-defined mass within the right lateral neck, again measuring 3.3 x 1.8 cm in transaxial dimensions.   Unchanged mildly enlarged left level I lymph node measuring 1.1 cm in short axis.   Unchanged node or nodule at the thoracic inlet, measuring 1.3 x 0.8 cm.   Please refer to concurrently performed chest CT for a description of findings below the level of the thoracic inlet.     10/02/2019 Imaging   CT chest 1. No new or progressive findings in the chest to suggest metastatic disease. 2. Bilateral subcentimeter solid pulmonary nodules are stable since 2018. 3. Hyperdense 1.1 cm anterior liver focus, not clearly  visualized on prior studies. Suggest MRI abdomen without and with IV contrast for further characterization.   10/20/2019 - 12/29/2019 Chemotherapy   The patient had ondansetron (ZOFRAN) injection 8 mg, 8 mg (100 % of original dose 8 mg), Intravenous,  Once, 2 of 5 cycles Dose modification: 8 mg (original dose 8 mg, Cycle 2) Administration: 8 mg (11/17/2019), 8 mg (12/15/2019), 8 mg (12/29/2019) gemcitabine (GEMZAR) 2,000 mg in sodium chloride 0.9 % 250 mL chemo infusion, 2,090 mg, Intravenous,  Once, 3 of 6 cycles Administration: 2,000 mg (10/20/2019), 2,000 mg (11/03/2019), 2,000 mg (11/17/2019), 2,000 mg (12/15/2019), 2,000 mg (12/29/2019)  for chemotherapy treatment.    06/12/2020 Imaging   1. Enlarging superficial, exophytic component of the chronic right sternocleidomastoid muscle mass. See series 6, image 55. 2. Elsewhere stable CT appearance of the Neck.   06/12/2020 Imaging   Post treatment scarring in the left hemithorax, stable. No evidence recurrent or metastatic disease   06/14/2020 - 10/15/2020 Chemotherapy   He received carboplatin, 5FU and Rande Lawman       10/31/2020 Procedure   Interval improvement in right lateral lymph node mass. Improvement in dermal component as well as invasion of the right sternocleidomastoid muscle.   10 mm submental lymph node slightly enlarged compared to the prior study. Continued follow-up recommended.   11/01/2020 - 05/22/2022 Chemotherapy   Patient is on Treatment Plan : HEAD/NECK Pembrolizumab Q21D     07/03/2022 - 10/30/2022 Chemotherapy   Patient is on Treatment Plan : Head and neck Pembrolizumab (400) q42d     11/30/2022 Imaging   CT chest  1. Stable exam. No new or progressive findings to suggest recurrent or metastatic disease. 2. Stable tiny bilateral nodules since 06/12/2020, consistent with benign etiology. 3. Punctate nonobstructing left renal stone.   12/01/2022 Imaging   CT neck  Growing right neck mass since 2022 with epidural tumor  extension via the right C2-3 foramen. Cervical MRI with contrast would be contributory.   12/11/2022 -  Chemotherapy   Patient is on Treatment Plan : HEAD/NECK toripalimab-tpzi D1 + cisplatin D1 + gemcitabine D1,8 q21d x 6 cycles / toripalimab-tpzi q21d (up to 61m)       PHYSICAL EXAMINATION: ECOG PERFORMANCE STATUS: 1 - Symptomatic but completely ambulatory  Vitals:   03/19/23 0934  BP: (!) 134/92  Pulse: 94  Resp: 18  Temp: 98.1 F (36.7 C)  SpO2: 100%   Filed Weights   03/19/23 0934  Weight: 190 lb  9.6 oz (86.5 kg)    GENERAL:alert, no distress and comfortable SKIN: skin color, texture, turgor are normal, no rashes or significant lesions EYES: normal, Conjunctiva are pink and non-injected, sclera clear OROPHARYNX:no exudate, no erythema and lips, buccal mucosa, and tongue normal  NECK: Persistent mass on the right side of his neck LYMPH:  no palpable lymphadenopathy in the cervical, axillary or inguinal LUNGS: clear to auscultation and percussion with normal breathing effort HEART: regular rate & rhythm and no murmurs and no lower extremity edema ABDOMEN:abdomen soft, non-tender and normal bowel sounds Musculoskeletal:no cyanosis of digits and no clubbing  NEURO: alert & oriented x 3 with fluent speech, no focal motor/sensory deficits  LABORATORY DATA:  I have reviewed the data as listed    Component Value Date/Time   NA 140 03/04/2023 0748   NA 139 09/22/2017 0829   K 3.5 03/04/2023 0748   K 3.5 09/22/2017 0829   CL 103 03/04/2023 0748   CO2 30 03/04/2023 0748   CO2 26 09/22/2017 0829   GLUCOSE 189 (H) 03/04/2023 0748   GLUCOSE 133 09/22/2017 0829   BUN 10 03/04/2023 0748   BUN 14.1 09/22/2017 0829   CREATININE 0.78 03/04/2023 0748   CREATININE 0.9 09/22/2017 0829   CALCIUM 8.6 (L) 03/04/2023 0748   CALCIUM 9.1 09/22/2017 0829   PROT 6.8 03/04/2023 0748   PROT 6.8 09/22/2017 0829   ALBUMIN 4.1 03/04/2023 0748   ALBUMIN 4.1 09/22/2017 0829   AST 20  03/04/2023 0748   AST 22 09/22/2017 0829   ALT 23 03/04/2023 0748   ALT 30 09/22/2017 0829   ALKPHOS 71 03/04/2023 0748   ALKPHOS 55 09/22/2017 0829   BILITOT 0.3 03/04/2023 0748   BILITOT 0.35 09/22/2017 0829   GFRNONAA >60 03/04/2023 0748   GFRAA >60 07/05/2020 0845   GFRAA >60 02/06/2019 1215    No results found for: "SPEP", "UPEP"  Lab Results  Component Value Date   WBC 4.6 03/19/2023   NEUTROABS 2.6 03/19/2023   HGB 10.8 (L) 03/19/2023   HCT 33.1 (L) 03/19/2023   MCV 90.4 03/19/2023   PLT 327 03/19/2023      Chemistry      Component Value Date/Time   NA 140 03/04/2023 0748   NA 139 09/22/2017 0829   K 3.5 03/04/2023 0748   K 3.5 09/22/2017 0829   CL 103 03/04/2023 0748   CO2 30 03/04/2023 0748   CO2 26 09/22/2017 0829   BUN 10 03/04/2023 0748   BUN 14.1 09/22/2017 0829   CREATININE 0.78 03/04/2023 0748   CREATININE 0.9 09/22/2017 0829      Component Value Date/Time   CALCIUM 8.6 (L) 03/04/2023 0748   CALCIUM 9.1 09/22/2017 0829   ALKPHOS 71 03/04/2023 0748   ALKPHOS 55 09/22/2017 0829   AST 20 03/04/2023 0748   AST 22 09/22/2017 0829   ALT 23 03/04/2023 0748   ALT 30 09/22/2017 0829   BILITOT 0.3 03/04/2023 0748   BILITOT 0.35 09/22/2017 0829

## 2023-03-19 NOTE — Patient Instructions (Signed)
[facility]  Discharge Instructions: Thank you for choosing [facility] to provide your oncology and hematology care.   If you have a lab appointment with the Cancer Center, please go directly to the Cancer Center and check in at the registration area.   Wear comfortable clothing and clothing appropriate for easy access to any Portacath or PICC line.   We strive to give you quality time with your provider. You may need to reschedule your appointment if you arrive late (15 or more minutes).  Arriving late affects you and other patients whose appointments are after yours.  Also, if you miss three or more appointments without notifying the office, you may be dismissed from the clinic at the provider's discretion.      For prescription refill requests, have your pharmacy contact our office and allow 72 hours for refills to be completed.    Today you received the following chemotherapy and/or immunotherapy agents: Gemcitabine      To help prevent nausea and vomiting after your treatment, we encourage you to take your nausea medication as directed.  BELOW ARE SYMPTOMS THAT SHOULD BE REPORTED IMMEDIATELY: *FEVER GREATER THAN 100.4 F (38 C) OR HIGHER *CHILLS OR SWEATING *NAUSEA AND VOMITING THAT IS NOT CONTROLLED WITH YOUR NAUSEA MEDICATION *UNUSUAL SHORTNESS OF BREATH *UNUSUAL BRUISING OR BLEEDING *URINARY PROBLEMS (pain or burning when urinating, or frequent urination) *BOWEL PROBLEMS (unusual diarrhea, constipation, pain near the anus) TENDERNESS IN MOUTH AND THROAT WITH OR WITHOUT PRESENCE OF ULCERS (sore throat, sores in mouth, or a toothache) UNUSUAL RASH, SWELLING OR PAIN  UNUSUAL VAGINAL DISCHARGE OR ITCHING   Items with * indicate a potential emergency and should be followed up as soon as possible or go to the Emergency Department if any problems should occur.  Please show the CHEMOTHERAPY ALERT CARD or IMMUNOTHERAPY ALERT CARD at  check-in to the Emergency Department and triage nurse.  Should you have questions after your visit or need to cancel or reschedule your appointment, please contact [facility]  Dept: [phone]  and follow the prompts.  Office hours are 8:00 a.m. to 4:30 p.m. Monday - Friday. Please note that voicemails left after 4:00 p.m. may not be returned until the following business day.  We are closed weekends and major holidays. You have access to a nurse at all times for urgent questions. Please call the main number to the clinic Dept: [phone] and follow the prompts.   For any non-urgent questions, you may also contact your provider using MyChart. We now offer e-Visits for anyone 18 and older to request care online for non-urgent symptoms. For details visit [email or web address].   Also download the MyChart app! Go to the app store, search "MyChart", open the app, select [place], and log in with your MyChart username and password. 

## 2023-03-19 NOTE — Assessment & Plan Note (Signed)
He has minimum pain now that his disease is under control He has pain medicine to take as needed I refilled his prescriptions today

## 2023-03-19 NOTE — Assessment & Plan Note (Signed)
He has intermittent clinical improvement to the mass on the right side of his neck with treatment He denies neuropathy We will continue current dose without dose adjustment 

## 2023-03-20 ENCOUNTER — Other Ambulatory Visit: Payer: Self-pay

## 2023-03-22 ENCOUNTER — Telehealth: Payer: Self-pay | Admitting: Hematology and Oncology

## 2023-03-22 NOTE — Telephone Encounter (Signed)
Spoke with patient confirming upcoming appointments  

## 2023-03-23 ENCOUNTER — Other Ambulatory Visit: Payer: Self-pay

## 2023-03-24 ENCOUNTER — Other Ambulatory Visit (HOSPITAL_BASED_OUTPATIENT_CLINIC_OR_DEPARTMENT_OTHER): Payer: Self-pay

## 2023-03-26 ENCOUNTER — Other Ambulatory Visit: Payer: Self-pay

## 2023-03-26 ENCOUNTER — Other Ambulatory Visit (HOSPITAL_BASED_OUTPATIENT_CLINIC_OR_DEPARTMENT_OTHER): Payer: Self-pay

## 2023-03-27 ENCOUNTER — Other Ambulatory Visit: Payer: Self-pay

## 2023-03-30 ENCOUNTER — Telehealth: Payer: Self-pay

## 2023-03-30 NOTE — Telephone Encounter (Signed)
Called and given below message. Added appt for Friday. He verbalized understanding and will call the office back for worsening symptoms or questions.

## 2023-03-30 NOTE — Telephone Encounter (Signed)
Keep his cancer site dry Taking advil 400 mg TID PO with food along with his pain medications Add him to my schedule on Friday at 130 pm, see in infusion room

## 2023-03-30 NOTE — Telephone Encounter (Signed)
Returned his call. For 4-5 days he has been having extreme muscle pain to back of neck/ shoulders and tumor site pain. He is taking pain medications as ordered. He has tried Aleve and Advil. He as tried applying heat to his neck and massaging his neck. He is not getting any relief. The tumor site is oozing.  He is asking if Dr. Bertis Ruddy can send Rx to pharmacy or what she recommends.

## 2023-04-01 MED FILL — Dexamethasone Sodium Phosphate Inj 100 MG/10ML: INTRAMUSCULAR | Qty: 1 | Status: AC

## 2023-04-01 MED FILL — Fosaprepitant Dimeglumine For IV Infusion 150 MG (Base Eq): INTRAVENOUS | Qty: 5 | Status: AC

## 2023-04-02 ENCOUNTER — Encounter: Payer: Self-pay | Admitting: Hematology and Oncology

## 2023-04-02 ENCOUNTER — Inpatient Hospital Stay: Payer: Medicaid Other

## 2023-04-02 ENCOUNTER — Inpatient Hospital Stay (HOSPITAL_BASED_OUTPATIENT_CLINIC_OR_DEPARTMENT_OTHER): Payer: Medicaid Other | Admitting: Hematology and Oncology

## 2023-04-02 ENCOUNTER — Other Ambulatory Visit (HOSPITAL_COMMUNITY): Payer: Self-pay

## 2023-04-02 VITALS — BP 167/101 | HR 86 | Temp 98.1°F | Resp 18 | Wt 189.2 lb

## 2023-04-02 DIAGNOSIS — G893 Neoplasm related pain (acute) (chronic): Secondary | ICD-10-CM

## 2023-04-02 DIAGNOSIS — C7800 Secondary malignant neoplasm of unspecified lung: Secondary | ICD-10-CM | POA: Diagnosis not present

## 2023-04-02 DIAGNOSIS — Z95828 Presence of other vascular implants and grafts: Secondary | ICD-10-CM

## 2023-04-02 DIAGNOSIS — C760 Malignant neoplasm of head, face and neck: Secondary | ICD-10-CM

## 2023-04-02 DIAGNOSIS — Z7189 Other specified counseling: Secondary | ICD-10-CM

## 2023-04-02 DIAGNOSIS — Z5111 Encounter for antineoplastic chemotherapy: Secondary | ICD-10-CM | POA: Diagnosis not present

## 2023-04-02 LAB — CBC WITH DIFFERENTIAL (CANCER CENTER ONLY)
Abs Immature Granulocytes: 0.02 10*3/uL (ref 0.00–0.07)
Basophils Absolute: 0 10*3/uL (ref 0.0–0.1)
Basophils Relative: 0 %
Eosinophils Absolute: 0.1 10*3/uL (ref 0.0–0.5)
Eosinophils Relative: 2 %
HCT: 33.4 % — ABNORMAL LOW (ref 39.0–52.0)
Hemoglobin: 11 g/dL — ABNORMAL LOW (ref 13.0–17.0)
Immature Granulocytes: 0 %
Lymphocytes Relative: 28 %
Lymphs Abs: 1.6 10*3/uL (ref 0.7–4.0)
MCH: 29.8 pg (ref 26.0–34.0)
MCHC: 32.9 g/dL (ref 30.0–36.0)
MCV: 90.5 fL (ref 80.0–100.0)
Monocytes Absolute: 0.6 10*3/uL (ref 0.1–1.0)
Monocytes Relative: 11 %
Neutro Abs: 3.3 10*3/uL (ref 1.7–7.7)
Neutrophils Relative %: 59 %
Platelet Count: 203 10*3/uL (ref 150–400)
RBC: 3.69 MIL/uL — ABNORMAL LOW (ref 4.22–5.81)
RDW: 16.1 % — ABNORMAL HIGH (ref 11.5–15.5)
WBC Count: 5.7 10*3/uL (ref 4.0–10.5)
nRBC: 0 % (ref 0.0–0.2)

## 2023-04-02 LAB — CMP (CANCER CENTER ONLY)
ALT: 27 U/L (ref 0–44)
AST: 21 U/L (ref 15–41)
Albumin: 4.1 g/dL (ref 3.5–5.0)
Alkaline Phosphatase: 69 U/L (ref 38–126)
Anion gap: 7 (ref 5–15)
BUN: 11 mg/dL (ref 6–20)
CO2: 31 mmol/L (ref 22–32)
Calcium: 9.3 mg/dL (ref 8.9–10.3)
Chloride: 100 mmol/L (ref 98–111)
Creatinine: 0.82 mg/dL (ref 0.61–1.24)
GFR, Estimated: >60 mL/min (ref >60)
Glucose, Bld: 197 mg/dL — ABNORMAL HIGH (ref 70–99)
Potassium: 3.5 mmol/L (ref 3.5–5.1)
Sodium: 138 mmol/L (ref 135–145)
Total Bilirubin: 0.2 mg/dL — ABNORMAL LOW (ref 0.3–1.2)
Total Protein: 7.1 g/dL (ref 6.5–8.1)

## 2023-04-02 LAB — MAGNESIUM: Magnesium: 1.7 mg/dL (ref 1.7–2.4)

## 2023-04-02 MED ORDER — SODIUM CHLORIDE 0.9% FLUSH
10.0000 mL | Freq: Once | INTRAVENOUS | Status: AC | PRN
  Administered 2023-04-02: 10 mL

## 2023-04-02 MED ORDER — PALONOSETRON HCL INJECTION 0.25 MG/5ML
0.2500 mg | Freq: Once | INTRAVENOUS | Status: AC
  Administered 2023-04-02: 0.25 mg via INTRAVENOUS
  Filled 2023-04-02: qty 5

## 2023-04-02 MED ORDER — SODIUM CHLORIDE 0.9 % IV SOLN
40.0000 mg/m2 | Freq: Once | INTRAVENOUS | Status: AC
  Administered 2023-04-02: 82 mg via INTRAVENOUS
  Filled 2023-04-02: qty 82

## 2023-04-02 MED ORDER — LORAZEPAM 0.5 MG PO TABS
0.5000 mg | ORAL_TABLET | Freq: Two times a day (BID) | ORAL | 0 refills | Status: DC | PRN
  Filled 2023-04-02: qty 30, 15d supply, fill #0

## 2023-04-02 MED ORDER — SODIUM CHLORIDE 0.9 % IV SOLN
10.0000 mg | Freq: Once | INTRAVENOUS | Status: AC
  Administered 2023-04-02: 10 mg via INTRAVENOUS
  Filled 2023-04-02: qty 10

## 2023-04-02 MED ORDER — SULFAMETHOXAZOLE-TRIMETHOPRIM 800-160 MG PO TABS
1.0000 | ORAL_TABLET | Freq: Two times a day (BID) | ORAL | 0 refills | Status: DC
  Filled 2023-04-02: qty 14, 7d supply, fill #0

## 2023-04-02 MED ORDER — SODIUM CHLORIDE 0.9% FLUSH
10.0000 mL | INTRAVENOUS | Status: DC | PRN
  Administered 2023-04-02: 10 mL

## 2023-04-02 MED ORDER — SODIUM CHLORIDE 0.9 % IV SOLN
750.0000 mg/m2 | Freq: Once | INTRAVENOUS | Status: AC
  Administered 2023-04-02: 1520 mg via INTRAVENOUS
  Filled 2023-04-02: qty 39.98

## 2023-04-02 MED ORDER — SODIUM CHLORIDE 0.9 % IV SOLN
150.0000 mg | Freq: Once | INTRAVENOUS | Status: AC
  Administered 2023-04-02: 150 mg via INTRAVENOUS
  Filled 2023-04-02: qty 150

## 2023-04-02 MED ORDER — SODIUM CHLORIDE 0.9 % IV SOLN: Freq: Once | INTRAVENOUS | Status: AC

## 2023-04-02 MED ORDER — TORIPALIMAB-TPZI CHEMO INJECTION 240 MG/6ML
240.0000 mg | Freq: Once | INTRAVENOUS | Status: AC
  Administered 2023-04-02: 240 mg via INTRAVENOUS
  Filled 2023-04-02: qty 6

## 2023-04-02 MED ORDER — MAGNESIUM SULFATE 2 GM/50ML IV SOLN
2.0000 g | Freq: Once | INTRAVENOUS | Status: AC
  Administered 2023-04-02: 2 g via INTRAVENOUS
  Filled 2023-04-02: qty 50

## 2023-04-02 MED ORDER — OXYCODONE HCL 15 MG PO TABS
15.0000 mg | ORAL_TABLET | ORAL | 0 refills | Status: DC | PRN
  Filled 2023-04-02: qty 90, 15d supply, fill #0

## 2023-04-02 MED ORDER — POTASSIUM CHLORIDE IN NACL 20-0.9 MEQ/L-% IV SOLN
Freq: Once | INTRAVENOUS | Status: AC
  Filled 2023-04-02: qty 1000

## 2023-04-02 NOTE — Patient Instructions (Signed)
[facility]  Discharge Instructions: Thank you for choosing [facility] to provide your oncology and hematology care.   If you have a lab appointment with the Cancer Center, please go directly to the Cancer Center and check in at the registration area.   Wear comfortable clothing and clothing appropriate for easy access to any Portacath or PICC line.   We strive to give you quality time with your provider. You may need to reschedule your appointment if you arrive late (15 or more minutes).  Arriving late affects you and other patients whose appointments are after yours.  Also, if you miss three or more appointments without notifying the office, you may be dismissed from the clinic at the provider's discretion.      For prescription refill requests, have your pharmacy contact our office and allow 72 hours for refills to be completed.    Today you received the following chemotherapy and/or immunotherapy agents Toripalimab, Gemzar, Cisplatin.      To help prevent nausea and vomiting after your treatment, we encourage you to take your nausea medication as directed.  BELOW ARE SYMPTOMS THAT SHOULD BE REPORTED IMMEDIATELY: *FEVER GREATER THAN 100.4 F (38 C) OR HIGHER *CHILLS OR SWEATING *NAUSEA AND VOMITING THAT IS NOT CONTROLLED WITH YOUR NAUSEA MEDICATION *UNUSUAL SHORTNESS OF BREATH *UNUSUAL BRUISING OR BLEEDING *URINARY PROBLEMS (pain or burning when urinating, or frequent urination) *BOWEL PROBLEMS (unusual diarrhea, constipation, pain near the anus) TENDERNESS IN MOUTH AND THROAT WITH OR WITHOUT PRESENCE OF ULCERS (sore throat, sores in mouth, or a toothache) UNUSUAL RASH, SWELLING OR PAIN  UNUSUAL VAGINAL DISCHARGE OR ITCHING   Items with * indicate a potential emergency and should be followed up as soon as possible or go to the Emergency Department if any problems should occur.  Please show the CHEMOTHERAPY ALERT CARD or  IMMUNOTHERAPY ALERT CARD at check-in to the Emergency Department and triage nurse.  Should you have questions after your visit or need to cancel or reschedule your appointment, please contact [facility]  Dept: [phone]  and follow the prompts.  Office hours are 8:00 a.m. to 4:30 p.m. Monday - Friday. Please note that voicemails left after 4:00 p.m. may not be returned until the following business day.  We are closed weekends and major holidays. You have access to a nurse at all times for urgent questions. Please call the main number to the clinic Dept: [phone] and follow the prompts.   For any non-urgent questions, you may also contact your provider using MyChart. We now offer e-Visits for anyone 31 and older to request care online for non-urgent symptoms. For details visit [email or web address].   Also download the MyChart app! Go to the app store, search "MyChart", open the app, select [facility], and log in with your MyChart username and password.

## 2023-04-02 NOTE — Assessment & Plan Note (Signed)
He says worsening pain control I recommend increasing the dose of oxycodone and he agrees

## 2023-04-02 NOTE — Assessment & Plan Note (Signed)
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

## 2023-04-02 NOTE — Assessment & Plan Note (Signed)
I will order CT imaging of the chest to rule out metastatic disease to his lungs

## 2023-04-02 NOTE — Progress Notes (Signed)
[facility] OFFICE PROGRESS NOTE  Patient Care Team: Artis Delay, MD as PCP - General (Hematology and Oncology) Artis Delay, MD as Consulting Physician (Hematology and Oncology) Elfredia Nevins, MD as Referring Physician (Plastic Surgery) Hilarie Fredrickson, MD as Consulting Physician (Gastroenterology)  ASSESSMENT & PLAN:  Malignant neoplasm of head and neck [facility]) The tumor on the right side of his neck has grown substantially since the last visit There could be a component of infection We will proceed with chemotherapy as directed but I plan to repeat imaging study next month before his next appointment I will also prescribe a course of antibiotics as it could be superinfected  Malignant neoplasm metastatic to lung [facility]) I will order CT imaging of the chest to rule out metastatic disease to his lungs  Cancer associated pain He says worsening pain control I recommend increasing the dose of oxycodone and he agrees  Orders Placed This Encounter  Procedures   CT Soft Tissue Neck W Contrast    Standing Status:   Future    Standing Expiration Date:   04/01/2024    Order Specific Question:   If indicated for the ordered procedure, I authorize the administration of contrast media per Radiology protocol    Answer:   Yes    Order Specific Question:   Does the patient have a contrast media/X-ray dye allergy?    Answer:   No    Order Specific Question:   Preferred imaging location?    Answer:   [facility]   CT Chest W Contrast    Standing Status:   Future    Standing Expiration Date:   04/01/2024    Order Specific Question:   If indicated for the ordered procedure, I authorize the administration of contrast media per Radiology protocol    Answer:   Yes    Order Specific Question:   Does the patient have a contrast media/X-ray dye allergy?    Answer:   No    Order Specific Question:   Preferred imaging location?    Answer:   [facility]    All questions  were answered. The patient knows to call the clinic with any problems, questions or concerns. The total time spent in the appointment was 40 minutes encounter with patients including review of chart and various tests results, discussions about plan of care and coordination of care plan   Artis Delay, MD 04/02/2023 1:41 PM  INTERVAL HISTORY: Please see below for problem oriented charting. he returns for treatment follow-up  He is seen in the infusion room This past week, he noted increasing pain, swelling and discharge from the site of his disease on the right side of his neck He denies fever or chills He is very anxious He has taken more pain medicine recently due to this pain We discussed treatment options and importance of staging scans  REVIEW OF SYSTEMS:   Constitutional: Denies fevers, chills or abnormal weight loss Eyes: Denies blurriness of vision Ears, nose, mouth, throat, and face: Denies mucositis or sore throat Respiratory: Denies cough, dyspnea or wheezes Cardiovascular: Denies palpitation, chest discomfort or lower extremity swelling Gastrointestinal:  Denies nausea, heartburn or change in bowel habits Skin: Denies abnormal skin rashes Lymphatics: Denies new lymphadenopathy or easy bruising Neurological:Denies numbness, tingling or new weaknesses Behavioral/Psych: Mood is stable, no new changes  All other systems were reviewed with the patient and are negative.  I have reviewed the past medical history, past surgical history,  social history and family history with the patient and they are unchanged from previous note.  ALLERGIES:  is allergic to phenergan [promethazine hcl], heparin, and clindamycin.  MEDICATIONS:  Current Outpatient Medications  Medication Sig Dispense Refill   oxyCODONE (ROXICODONE) 15 MG immediate release tablet Take 1 tablet (15 mg total) by mouth every 4 (four) hours as needed for severe pain. 90 tablet 0   sulfamethoxazole-trimethoprim (BACTRIM  DS) 800-160 MG tablet Take 1 tablet by mouth 2 (two) times daily. 14 tablet 0   blood glucose meter kit and supplies KIT Dispense based on patient and insurance preference. Use up to four times daily as directed. (FOR ICD-9 250.00, 250.01). 1 each 1   cetirizine (ZYRTEC) 10 MG tablet Take 1 tablet (10 mg total) by mouth daily as needed for allergies. 100 tablet 0   dexamethasone (DECADRON) 4 MG tablet Take 1 tablet (4 mg total) by mouth daily for 3 days after chemo, every 2 weeks. 30 tablet 0   famotidine (PEPCID) 40 MG tablet Take 1 tablet (40 mg total) by mouth at bedtime. 30 tablet 3   levothyroxine (SYNTHROID) 175 MCG tablet Take 1 tablet (175 mcg total) by mouth daily before breakfast. 30 tablet 3   LORazepam (ATIVAN) 0.5 MG tablet Take 1 tablet (0.5 mg total) by mouth 2 (two) times daily as needed for anxiety. 30 tablet 0   metFORMIN (GLUCOPHAGE) 500 MG tablet Take 1 tablet (500 mg total) by mouth 2 (two) times daily with a meal. 60 tablet 2   methadone (DOLOPHINE) 10 MG tablet Take 1 tablet (10 mg total) by mouth every 12 (twelve) hours. 60 tablet 0   ondansetron (ZOFRAN) 8 MG tablet Take 1 tablet (8 mg total) by mouth every 8 (eight) hours as needed for nausea or vomiting. 90 tablet 1   pantoprazole (PROTONIX) 40 MG tablet Take 1 tablet (40 mg total) by mouth daily. 30 tablet 2   polyethylene glycol (MIRALAX / GLYCOLAX) 17 g packet Take 17 g by mouth 2 (two) times daily.     senna-docusate (SENOKOT-S) 8.6-50 MG tablet Take 2 tablets by mouth 2 (two) times daily. 100 tablet 3   No current facility-administered medications for this visit.   Facility-Administered Medications Ordered in Other Visits  Medication Dose Route Frequency Provider Last Rate Last Admin   anticoagulant sodium citrate solution 5 mL  5 mL Intracatheter Once Bertis Ruddy, Eiliyah Reh, MD       anticoagulant sodium citrate solution 5 mL  5 mL Intracatheter Once Bertis Ruddy, Leeann Bady, MD       CISplatin (PLATINOL) 82 mg in sodium chloride 0.9 %  250 mL chemo infusion  40 mg/m2 (Treatment Plan Recorded) Intravenous Once Bertis Ruddy, Helder Crisafulli, MD       gemcitabine (GEMZAR) 1,520 mg in sodium chloride 0.9 % 250 mL chemo infusion  750 mg/m2 (Treatment Plan Recorded) Intravenous Once Bertis Ruddy, Londin Antone, MD 580 mL/hr at 04/02/23 1325 1,520 mg at 04/02/23 1325   sodium chloride flush (NS) 0.9 % injection 10 mL  10 mL Intracatheter PRN Artis Delay, MD        SUMMARY OF ONCOLOGIC HISTORY: Oncology History Overview Note  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 Pan-endoscopy elsewhere showed cancer from nasopharyngeal space.   01/04/2007 - 02/20/2007 Chemotherapy   He received 2 cycles of cisplatin and 5FU followed by concurrent chemo with weekly cisplatin and radiation. He only received 2 doses of chemo due to severe mucositis, nausea and weight loss.   04/05/2007 - 08/04/2007 Chemotherapy   He received 4 more courses of cisplatin with 5FU and had complete response   07/05/2009 Procedure   Fine-needle aspirate of the right level II lymph nodes come from recurrent metastatic disease. Repeat endoscopy and CT scan show no evidence of disease elsewhere.   07/08/2009 - 12/02/2009 Chemotherapy   He was given 6 cycles of carboplatin, 5-FU and docetaxel   12/03/2009 Surgery   He has surgery to the residual lymph node on the right neck which showed no evidence of disease.   02/22/2012 Imaging   Repeat imaging study showed large recurrent mass. He was referred elsewhere for further treatment.    05/03/2012 Surgery   He underwent left upper lobectomy.   04/29/2013 Imaging   PEt scan showed lesion on right level II B and lower lung was abnormal   06/03/2013 - 02/02/2014 Chemotherapy   He had 6 cycles of chemotherapy when he was found to have recurrence of cancer and had received oxaliplatin and capecitabine   06/07/2014 Imaging   PET CT scan showed persistent disease in the right neck lymph nodes and left lung   06/29/2014 Procedure   Accession: [id number] repeat LUL biopsy confirmed metastatic cancer   07/18/2014 - 07/31/2014 Radiation Therapy   He received palliative radiation therapy to the lungs   10/10/2014 Imaging   CT scan of the chest, abdomen and pelvis show regression in the size of the lung nodule in the left upper lobe and stable pulmonary nodules   01/24/2015 Imaging   CT scan showed stable disease in neck and lung   06/19/2015 Imaging   CT scan of the neck and the chest show possible mild progression of the nodule in the right side of the neck.   06/25/2015 Imaging   PET scan confirmed disease recurrence in the neck   07/07/2015 Imaging   He had [facility]   09/03/2015 - 08/26/2018 Chemotherapy   He received palliative chemo with Nivolumab   10/29/2015 Imaging   PET CT showed positive response to Rx   02/28/2016 Imaging   Ct abdomen showed abnormal thinkening in his stomach   03/03/2016 Imaging   CT: Right sternocleidomastoid muscle metastasis appears less distinct but otherwise not significantly changed in size or configuration since 06/19/2015.2. Left level 3 lymph node which was hypermetabolic by PET-CT in January 2017 appears slightly smaller   04/01/2016 Imaging   CT cervical spine showed no acute fracture or traumatic malalignment in the cervical spine   04/22/2016 Procedure   Port-a-cath placed.   06/16/2016 Imaging   Ct neck showed right sternocleidomastoid muscle metastasis is further decreased in conspicuity since May, and has mildly decreased in  size since September 2016. Continued stability of sub-centimeter left cervical lymph nodes. No new or progressive metastatic disease in the neck.   06/16/2016 Imaging   CT chest showed stable masslike radiation fibrosis in the left upper lobe. Stable subcentimeter pulmonary nodules in the bilateral lower lobes. No new or progressive metastatic disease in the  chest. Nonobstructing left renal stone.   10/13/2016 Imaging   Ct neck showed unchanged right sternocleidomastoid muscle metastasis. Unchanged subcentimeter left cervical lymph nodes. No evidence of new or progressive metastatic disease in the neck.   10/13/2016 Imaging   CT chest showed tiny hypervascular foci in the liver, not definitely seen on prior imaging of 06/16/2016 and 02/28/2016. Abdomen MRI without and with contrast recommended to further evaluate as metastatic disease is a concern. 2. Stable appearance of post treatment changes left upper lung and scattered tiny bilateral pulmonary nodules.   02/11/2017 Imaging   Ct neck: Lymph node mass right posterior neck appears improved from the prior study. Small posterior lymph nodes on the left unchanged. Occluded right jugular vein unchanged.   02/11/2017 Imaging   1. Similar appearance of postsurgical and radiation changes in the left upper lobe. 2. Similar bilateral pulmonary nodules. 3. No thoracic adenopathy. 4. Subtle foci of post-contrast enhancement within the liver are suboptimally characterized on this nondedicated study. Likely similar. These could either be re-evaluated at followup or more entirely characterized with abdominal MRI. 5. Left nephrolithiasis.   05/19/2017 Imaging   Matted lymph node mass right posterior neck appears larger in the recent CT. Accurate measurements difficult due to infiltrating tumor margins and infiltration of the muscle. Right jugular vein again appears occluded or resected. Small left posterior lymph nodes stable. Left upper lobe airspace density  stable and similar to the prior CT   06/03/2017 PET scan   1. Hypermetabolic ill-defined right level IIb lymph node, about 1.3 cm in diameter with maximum SUV 9.5 (formerly 8.1). Appearance suspicious for residual/recurrent malignancy. No worrisome left-sided lesion. 2. Left suprahilar indistinct opacity demonstrates no worrisome hypermetabolic activity. The 5 mm left lower lobe pulmonary nodule is stable and not currently hypermetabolic although below sensitive PET-CT size thresholds. 3. Other imaging findings of potential clinical significance: Bilateral nonobstructive nephrolithiasis. Chronic bilateral maxillary sinusitis.   05/11/2018 PET scan   1. Continued chronic accentuated metabolic activity in the vicinity of right level IIB and the adjacent right sternocleidomastoid muscle, with ill definition of surrounding tissue planes. Maximum SUV is currently 8.1, formerly 9.5. Accentuated metabolic activity is been present in this vicinity back through 06/25/2015, and there was also some low-level activity in this vicinity on 06/07/2014. Some of this may be from scarring and local muscular activity although clearly a component of residual tumor is difficult to exclude given the focally high activity. 2. Other imaging findings of potential clinical significance: Chronic bilateral maxillary sinusitis. Chronic scarring in the left upper lobe. Chronically stable 5 mm left lower lobe nodule is considered benign. Nonobstructive left nephrolithiasis.   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 COMMENT:No significant keratinization is identified. Other basaloid carcinomas are in the differential diagnosis. No cell block material is available for further testing.   09/12/2018 Procedure   He underwent fine Needle Aspiration   10/04/2018 PET scan   1. Significant  progression of local recurrence laterally in the mid right neck with an enlarging, increasingly hypermetabolic soft tissue mass. This involves the right sternocleidomastoid muscle. 2. Small lymph nodes in the right axilla are increasingly hypermetabolic. These are nonspecific and potentially reactive, although could reflect a small metastases. Small hypermetabolic nodule in the left suprasternal notch is unchanged. 3. No other evidence of metastatic disease.     10/07/2018 - 12/23/2018 Chemotherapy  The patient had cisplatin plus gemzar   12/07/2018 Imaging   1. Decreased size of lateral right neck mass. 2. Unchanged soft tissue nodule in the suprasternal notch. 3. No evidence of new metastatic disease in the neck.     05/30/2019 Imaging   CT neck No clear change or progression compared to the study of March. Overall measurements of the right lateral neck mass are similar, approximately 3 x 1.8 cm. See above discussion. One could argue that there is slight increase in lateral bulging, possibly with an increase in contrast enhancement, towards the inferior margin. This is of questionable validity but could possibly represent some progression or inflammatory change. Other findings in the region are stable.   07/20/2019 - 09/15/2019 Chemotherapy   The patient had dexamethasone (DECADRON) 4 MG tablet, 1 of 1 cycle, Start date: --, End date: -- palonosetron (ALOXI) injection 0.25 mg, 0.25 mg, Intravenous,  Once, 3 of 4 cycles Administration: 0.25 mg (07/20/2019), 0.25 mg (08/10/2019), 0.25 mg (09/08/2019) CISplatin (PLATINOL) 84 mg in sodium chloride 0.9 % 250 mL chemo infusion, 40 mg/m2 = 84 mg (80 % of original dose 50 mg/m2), Intravenous,  Once, 3 of 4 cycles Dose modification: 40 mg/m2 (80 % of original dose 50 mg/m2, Cycle 1, Reason: Dose Not Tolerated) Administration: 84 mg (07/20/2019), 84 mg (08/10/2019), 83 mg (09/08/2019) gemcitabine (GEMZAR) 1,600 mg in sodium chloride 0.9 % 250 mL chemo  infusion, 1,672 mg (80 % of original dose 1,000 mg/m2), Intravenous,  Once, 3 of 4 cycles Dose modification: 800 mg/m2 (80 % of original dose 1,000 mg/m2, Cycle 1, Reason: Provider Judgment) Administration: 1,600 mg (07/20/2019), 1,600 mg (07/27/2019), 1,600 mg (08/10/2019), 1,600 mg (08/17/2019), 1,600 mg (09/08/2019), 1,672 mg (09/15/2019) ondansetron (ZOFRAN) 8 mg, dexamethasone (DECADRON) 10 mg in sodium chloride 0.9 % 50 mL IVPB, , Intravenous,  Once, 3 of 4 cycles Administration:  (09/15/2019) fosaprepitant (EMEND) 150 mg, dexamethasone (DECADRON) 12 mg in sodium chloride 0.9 % 145 mL IVPB, , Intravenous,  Once, 3 of 4 cycles Administration:  (07/20/2019),  (08/10/2019),  (09/08/2019)  for chemotherapy treatment.    10/02/2019 Imaging   CT neck As compared to 05/30/2019, no significant interval change in size of an ill-defined mass within the right lateral neck, again measuring 3.3 x 1.8 cm in transaxial dimensions.   Unchanged mildly enlarged left level I lymph node measuring 1.1 cm in short axis.   Unchanged node or nodule at the thoracic inlet, measuring 1.3 x 0.8 cm.   Please refer to concurrently performed chest CT for a description of findings below the level of the thoracic inlet.     10/02/2019 Imaging   CT chest 1. No new or progressive findings in the chest to suggest metastatic disease. 2. Bilateral subcentimeter solid pulmonary nodules are stable since 2018. 3. Hyperdense 1.1 cm anterior liver focus, not clearly visualized on prior studies. Suggest MRI abdomen without and with IV contrast for further characterization.   10/20/2019 - 12/29/2019 Chemotherapy   The patient had ondansetron (ZOFRAN) injection 8 mg, 8 mg (100 % of original dose 8 mg), Intravenous,  Once, 2 of 5 cycles Dose modification: 8 mg (original dose 8 mg, Cycle 2) Administration: 8 mg (11/17/2019), 8 mg (12/15/2019), 8 mg (12/29/2019) gemcitabine (GEMZAR) 2,000 mg in sodium chloride 0.9 % 250 mL chemo  infusion, 2,090 mg, Intravenous,  Once, 3 of 6 cycles Administration: 2,000 mg (10/20/2019), 2,000 mg (11/03/2019), 2,000 mg (11/17/2019), 2,000 mg (12/15/2019), 2,000 mg (12/29/2019)  for chemotherapy treatment.  06/12/2020 Imaging   1. Enlarging superficial, exophytic component of the chronic right sternocleidomastoid muscle mass. See series 6, image 55. 2. Elsewhere stable CT appearance of the Neck.   06/12/2020 Imaging   Post treatment scarring in the left hemithorax, stable. No evidence recurrent or metastatic disease   06/14/2020 - 10/15/2020 Chemotherapy   He received carboplatin, 5FU and Rande Lawman       10/31/2020 Procedure   Interval improvement in right lateral lymph node mass. Improvement in dermal component as well as invasion of the right sternocleidomastoid muscle.   10 mm submental lymph node slightly enlarged compared to the prior study. Continued follow-up recommended.   11/01/2020 - 05/22/2022 Chemotherapy   Patient is on Treatment Plan : HEAD/NECK Pembrolizumab Q21D     07/03/2022 - 10/30/2022 Chemotherapy   Patient is on Treatment Plan : Head and neck Pembrolizumab (400) q42d     11/30/2022 Imaging   CT chest  1. Stable exam. No new or progressive findings to suggest recurrent or metastatic disease. 2. Stable tiny bilateral nodules since 06/12/2020, consistent with benign etiology. 3. Punctate nonobstructing left renal stone.   12/01/2022 Imaging   CT neck  Growing right neck mass since 2022 with epidural tumor extension via the right C2-3 foramen. Cervical MRI with contrast would be contributory.   12/11/2022 -  Chemotherapy   Patient is on Treatment Plan : HEAD/NECK toripalimab-tpzi D1 + cisplatin D1 + gemcitabine D1,8 q21d x 6 cycles / toripalimab-tpzi q21d (up to 54m)       PHYSICAL EXAMINATION: ECOG PERFORMANCE STATUS: 1 - Symptomatic but completely ambulatory GENERAL:alert, no distress and comfortable SKIN: skin color, texture, turgor are normal, no rashes or  significant lesions EYES: normal, Conjunctiva are pink and non-injected, sclera clear OROPHARYNX:no exudate, no erythema and lips, buccal mucosa, and tongue normal  NECK: Mass on his right side of the neck is substantially bigger than his previous exam NEURO: alert & oriented x 3 with fluent speech, no focal motor/sensory deficits  LABORATORY DATA:  I have reviewed the data as listed    Component Value Date/Time   NA 138 04/02/2023 0849   NA 139 09/22/2017 0829   K 3.5 04/02/2023 0849   K 3.5 09/22/2017 0829   CL 100 04/02/2023 0849   CO2 31 04/02/2023 0849   CO2 26 09/22/2017 0829   GLUCOSE 197 (H) 04/02/2023 0849   GLUCOSE 133 09/22/2017 0829   BUN 11 04/02/2023 0849   BUN 14.1 09/22/2017 0829   CREATININE 0.82 04/02/2023 0849   CREATININE 0.9 09/22/2017 0829   CALCIUM 9.3 04/02/2023 0849   CALCIUM 9.1 09/22/2017 0829   PROT 7.1 04/02/2023 0849   PROT 6.8 09/22/2017 0829   ALBUMIN 4.1 04/02/2023 0849   ALBUMIN 4.1 09/22/2017 0829   AST 21 04/02/2023 0849   AST 22 09/22/2017 0829   ALT 27 04/02/2023 0849   ALT 30 09/22/2017 0829   ALKPHOS 69 04/02/2023 0849   ALKPHOS 55 09/22/2017 0829   BILITOT 0.2 (L) 04/02/2023 0849   BILITOT 0.35 09/22/2017 0829   GFRNONAA >60 04/02/2023 0849   GFRAA >60 07/05/2020 0845   GFRAA >60 02/06/2019 1215    No results found for: "SPEP", "UPEP"  Lab Results  Component Value Date   WBC 5.7 04/02/2023   NEUTROABS 3.3 04/02/2023   HGB 11.0 (L) 04/02/2023   HCT 33.4 (L) 04/02/2023   MCV 90.5 04/02/2023   PLT 203 04/02/2023      Chemistry      Component  Value Date/Time   NA 138 04/02/2023 0849   NA 139 09/22/2017 0829   K 3.5 04/02/2023 0849   K 3.5 09/22/2017 0829   CL 100 04/02/2023 0849   CO2 31 04/02/2023 0849   CO2 26 09/22/2017 0829   BUN 11 04/02/2023 0849   BUN 14.1 09/22/2017 0829   CREATININE 0.82 04/02/2023 0849   CREATININE 0.9 09/22/2017 0829      Component Value Date/Time   CALCIUM 9.3 04/02/2023 0849    CALCIUM 9.1 09/22/2017 0829   ALKPHOS 69 04/02/2023 0849   ALKPHOS 55 09/22/2017 0829   AST 21 04/02/2023 0849   AST 22 09/22/2017 0829   ALT 27 04/02/2023 0849   ALT 30 09/22/2017 0829   BILITOT 0.2 (L) 04/02/2023 0849   BILITOT 0.35 09/22/2017 0829

## 2023-04-06 ENCOUNTER — Other Ambulatory Visit (HOSPITAL_BASED_OUTPATIENT_CLINIC_OR_DEPARTMENT_OTHER): Payer: Self-pay

## 2023-04-09 ENCOUNTER — Other Ambulatory Visit (HOSPITAL_BASED_OUTPATIENT_CLINIC_OR_DEPARTMENT_OTHER): Payer: Self-pay

## 2023-04-13 ENCOUNTER — Encounter (HOSPITAL_BASED_OUTPATIENT_CLINIC_OR_DEPARTMENT_OTHER): Payer: Self-pay

## 2023-04-13 ENCOUNTER — Ambulatory Visit (HOSPITAL_BASED_OUTPATIENT_CLINIC_OR_DEPARTMENT_OTHER)
Admission: RE | Admit: 2023-04-13 | Discharge: 2023-04-13 | Disposition: A | Discharge status: Home / Self Care | Payer: Medicaid Other | Source: Ambulatory Visit | Attending: Hematology and Oncology | Admitting: Hematology and Oncology

## 2023-04-13 DIAGNOSIS — C760 Malignant neoplasm of head, face and neck: Secondary | ICD-10-CM | POA: Insufficient documentation

## 2023-04-13 MED ORDER — IOHEXOL 300 MG/ML  SOLN
100.0000 mL | Freq: Once | INTRAMUSCULAR | Status: AC | PRN
  Administered 2023-04-13: 75 mL via INTRAVENOUS

## 2023-04-16 ENCOUNTER — Inpatient Hospital Stay: Payer: Medicaid Other | Attending: Hematology and Oncology | Admitting: Hematology and Oncology

## 2023-04-16 ENCOUNTER — Other Ambulatory Visit (HOSPITAL_COMMUNITY): Payer: Self-pay

## 2023-04-16 ENCOUNTER — Inpatient Hospital Stay: Payer: Medicaid Other

## 2023-04-16 ENCOUNTER — Other Ambulatory Visit: Payer: Self-pay

## 2023-04-16 ENCOUNTER — Other Ambulatory Visit (HOSPITAL_BASED_OUTPATIENT_CLINIC_OR_DEPARTMENT_OTHER): Payer: Self-pay

## 2023-04-16 ENCOUNTER — Encounter: Payer: Self-pay | Admitting: Hematology and Oncology

## 2023-04-16 VITALS — BP 136/3 | HR 103 | Temp 99.2°F | Resp 18 | Ht 69.0 in | Wt 191.0 lb

## 2023-04-16 DIAGNOSIS — Z79899 Other long term (current) drug therapy: Secondary | ICD-10-CM | POA: Diagnosis not present

## 2023-04-16 DIAGNOSIS — C119 Malignant neoplasm of nasopharynx, unspecified: Secondary | ICD-10-CM | POA: Diagnosis not present

## 2023-04-16 DIAGNOSIS — C7800 Secondary malignant neoplasm of unspecified lung: Secondary | ICD-10-CM

## 2023-04-16 DIAGNOSIS — K5909 Other constipation: Secondary | ICD-10-CM | POA: Diagnosis not present

## 2023-04-16 DIAGNOSIS — Z95828 Presence of other vascular implants and grafts: Secondary | ICD-10-CM

## 2023-04-16 DIAGNOSIS — F411 Generalized anxiety disorder: Secondary | ICD-10-CM | POA: Diagnosis not present

## 2023-04-16 DIAGNOSIS — Z7189 Other specified counseling: Secondary | ICD-10-CM

## 2023-04-16 DIAGNOSIS — C760 Malignant neoplasm of head, face and neck: Secondary | ICD-10-CM

## 2023-04-16 DIAGNOSIS — G893 Neoplasm related pain (acute) (chronic): Secondary | ICD-10-CM

## 2023-04-16 DIAGNOSIS — C7989 Secondary malignant neoplasm of other specified sites: Secondary | ICD-10-CM | POA: Diagnosis present

## 2023-04-16 LAB — CBC WITH DIFFERENTIAL (CANCER CENTER ONLY)
Abs Immature Granulocytes: 0.02 10*3/uL (ref 0.00–0.07)
Basophils Absolute: 0 10*3/uL (ref 0.0–0.1)
Basophils Relative: 0 %
Eosinophils Absolute: 0.1 10*3/uL (ref 0.0–0.5)
Eosinophils Relative: 2 %
HCT: 32.9 % — ABNORMAL LOW (ref 39.0–52.0)
Hemoglobin: 10.7 g/dL — ABNORMAL LOW (ref 13.0–17.0)
Immature Granulocytes: 0 %
Lymphocytes Relative: 33 %
Lymphs Abs: 1.6 10*3/uL (ref 0.7–4.0)
MCH: 29.3 pg (ref 26.0–34.0)
MCHC: 32.5 g/dL (ref 30.0–36.0)
MCV: 90.1 fL (ref 80.0–100.0)
Monocytes Absolute: 0.7 10*3/uL (ref 0.1–1.0)
Monocytes Relative: 14 %
Neutro Abs: 2.4 10*3/uL (ref 1.7–7.7)
Neutrophils Relative %: 51 %
Platelet Count: 218 10*3/uL (ref 150–400)
RBC: 3.65 MIL/uL — ABNORMAL LOW (ref 4.22–5.81)
RDW: 15.5 % (ref 11.5–15.5)
WBC Count: 4.8 10*3/uL (ref 4.0–10.5)
nRBC: 0 % (ref 0.0–0.2)

## 2023-04-16 LAB — CMP (CANCER CENTER ONLY)
ALT: 20 U/L (ref 0–44)
AST: 16 U/L (ref 15–41)
Albumin: 4.2 g/dL (ref 3.5–5.0)
Alkaline Phosphatase: 69 U/L (ref 38–126)
Anion gap: 7 (ref 5–15)
BUN: 15 mg/dL (ref 6–20)
CO2: 30 mmol/L (ref 22–32)
Calcium: 9.2 mg/dL (ref 8.9–10.3)
Chloride: 102 mmol/L (ref 98–111)
Creatinine: 0.89 mg/dL (ref 0.61–1.24)
GFR, Estimated: >60 mL/min (ref >60)
Glucose, Bld: 161 mg/dL — ABNORMAL HIGH (ref 70–99)
Potassium: 3.7 mmol/L (ref 3.5–5.1)
Sodium: 139 mmol/L (ref 135–145)
Total Bilirubin: 0.3 mg/dL (ref 0.3–1.2)
Total Protein: 6.8 g/dL (ref 6.5–8.1)

## 2023-04-16 MED ORDER — METHADONE HCL 10 MG PO TABS
10.0000 mg | ORAL_TABLET | Freq: Three times a day (TID) | ORAL | 0 refills | Status: DC
Start: 2023-04-16 — End: 2023-06-01
  Filled 2023-04-16 (×3): qty 90, 30d supply, fill #0

## 2023-04-16 MED ORDER — SODIUM CHLORIDE 0.9% FLUSH
10.0000 mL | Freq: Once | INTRAVENOUS | Status: AC | PRN
  Administered 2023-04-16: 10 mL

## 2023-04-16 NOTE — Progress Notes (Signed)
DISCONTINUE OFF PATHWAY REGIMEN - Head and Neck   [id number]:Cisplatin 80 mg/m2 IV D1 + Gemcitabine 1,000 mg/m2 IV D1,8 + Toripalimab 240 mg IV D1 q21 Days x 6 Cycles Followed by Toripalimab 240 mg IV D1 q21 Days for up to 2 Years:   Cycles 1 through 6: A cycle is every 21 days:     Toripalimab-tpzi      Gemcitabine      Cisplatin    Cycles 7 and beyond: A cycle is every 21 days:     Toripalimab-tpzi   **Always confirm dose/schedule in your pharmacy ordering system**  REASON: Disease Progression PRIOR TREATMENT: Off Pathway: Cisplatin 80 mg/m2 IV D1 + Gemcitabine 1,000 mg/m2 IV D1,8 + Toripalimab 240 mg IV D1 q21 Days x 6 Cycles Followed by Toripalimab 240 mg IV D1 q21 Days for up to 2 Years TREATMENT RESPONSE: Progressive Disease (PD)  START OFF PATHWAY REGIMEN - Head and Neck   [id number]:Carboplatin + Docetaxel (5/75) every 21 days:   A cycle is every 21 days:     Docetaxel      Carboplatin   **Always confirm dose/schedule in your pharmacy ordering system**  Patient Characteristics: Other Sites, Metastatic, Fourth Line and Beyond Disease Classification: Other Sites AJCC 8 Stage Grouping: IVB Therapeutic Status: Metastatic Disease Line of Therapy: Fourth Line and Beyond  Intent of Therapy: Non-Curative / Palliative Intent, Discussed with Patient

## 2023-04-16 NOTE — Assessment & Plan Note (Signed)
There is no evidence of active metastatic disease in his lungs We will continue surveillance imaging

## 2023-04-16 NOTE — Assessment & Plan Note (Signed)
He has significant worsening pain control with disease progression I plan to increase his methadone to 3 times a day and continue at current dose along with oxycodone as needed

## 2023-04-16 NOTE — Progress Notes (Signed)
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) I have reviewed imaging studies with the patient which showed disease progression We discussed his oncologic history and prior treatment The only chemotherapy regimen that he progress on are cisplatin and gemcitabine, along with 3 different immunotherapies including nivolumab, pembrolizumab and toripalimab Previously, he had good response to therapy with carboplatin, 5-FU, oxaliplatin, Xeloda, as well as Taxotere He has never received treatment with cetuximab We discussed the risk, benefits, side effects using combination of carboplatin, 5-FU and cetuximab followed by cetuximab maintenance in the future and he is in agreement to proceed We will get him scheduled to start treatment in 2 weeks I will see him on day 5 for toxicity review   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) There is no evidence of active metastatic disease in his lungs We will continue surveillance imaging  Cancer associated pain He has significant worsening pain control with disease progression I plan to increase his methadone to 3 times a day and continue at current dose along with oxycodone as needed  Orders Placed This Encounter  Procedures   CBC with Differential (Cancer Center Only)    Standing Status:   Future    Standing Expiration Date:   04/29/2024   CMP (Cancer Center only)    Standing Status:   Future    Standing Expiration Date:   04/29/2024   Magnesium    Standing Status:   Future    Standing Expiration Date:   04/29/2024   Basic Metabolic Panel - Cancer Center Only    Standing Status:   Future    Standing Expiration  Date:   05/13/2024   Magnesium    Standing Status:   Future    Standing Expiration Date:   05/13/2024   CBC with Differential (Cancer Center Only)    Standing Status:   Future    Standing Expiration Date:   05/20/2024   CMP (Cancer Center only)    Standing Status:   Future    Standing Expiration Date:   05/20/2024   Basic Metabolic Panel - Cancer Center Only    Standing Status:   Future    Standing Expiration Date:   05/27/2024   Magnesium    Standing Status:   Future    Standing Expiration Date:   05/27/2024   CBC with Differential (Cancer Center Only)    Standing Status:   Future    Standing Expiration Date:   06/10/2024   CMP (Cancer Center only)    Standing Status:   Future    Standing Expiration Date:   06/10/2024   Magnesium    Standing Status:   Future    Standing Expiration Date:   06/10/2024   Basic Metabolic Panel - Cancer Center Only    Standing Status:   Future    Standing Expiration Date:   06/24/2024   Magnesium    Standing Status:   Future    Standing Expiration Date:   06/24/2024   Basic Metabolic Panel - Cancer Center Only    Standing Status:   Future    Standing Expiration Date:   10/14/2024   Magnesium    Standing Status:   Future  Standing Expiration Date:   10/14/2024   Basic Metabolic Panel - Cancer Center Only    Standing Status:   Future    Standing Expiration Date:   09/30/2024   Magnesium    Standing Status:   Future    Standing Expiration Date:   09/30/2024   Basic Metabolic Panel - Cancer Center Only    Standing Status:   Future    Standing Expiration Date:   09/16/2024   Magnesium    Standing Status:   Future    Standing Expiration Date:   09/16/2024   Basic Metabolic Panel - Cancer Center Only    Standing Status:   Future    Standing Expiration Date:   09/02/2024   Magnesium    Standing Status:   Future    Standing Expiration Date:   09/02/2024   CBC with Differential (Cancer Center Only)    Standing Status:   Future    Standing Expiration Date:    08/12/2024   CMP (Cancer Center only)    Standing Status:   Future    Standing Expiration Date:   08/12/2024   Basic Metabolic Panel - Cancer Center Only    Standing Status:   Future    Standing Expiration Date:   08/19/2024   Magnesium    Standing Status:   Future    Standing Expiration Date:   08/19/2024   CBC with Differential (Cancer Center Only)    Standing Status:   Future    Standing Expiration Date:   07/22/2024   CMP (Cancer Center only)    Standing Status:   Future    Standing Expiration Date:   07/22/2024   Magnesium    Standing Status:   Future    Standing Expiration Date:   07/22/2024   Basic Metabolic Panel - Cancer Center Only    Standing Status:   Future    Standing Expiration Date:   08/05/2024   Magnesium    Standing Status:   Future    Standing Expiration Date:   08/05/2024   CBC with Differential (Cancer Center Only)    Standing Status:   Future    Standing Expiration Date:   07/01/2024   CMP (Cancer Center only)    Standing Status:   Future    Standing Expiration Date:   07/01/2024   Basic Metabolic Panel - Cancer Center Only    Standing Status:   Future    Standing Expiration Date:   07/08/2024   Magnesium    Standing Status:   Future    Standing Expiration Date:   07/08/2024    All questions were answered. The patient knows to call the clinic with any problems, questions or concerns. The total time spent in the appointment was 40 minutes encounter with patients including review of chart and various tests results, discussions about plan of care and coordination of care plan   Artis Delay, MD 04/16/2023 8:45 AM  INTERVAL HISTORY: Please see below for problem oriented charting. he returns for treatment follow-up We discussed CT imaging results and discussed treatment option He has slight worsening pain control  REVIEW OF SYSTEMS:   Constitutional: Denies fevers, chills or abnormal weight loss Eyes: Denies blurriness of vision Ears, nose, mouth, throat,  and face: Denies mucositis or sore throat Respiratory: Denies cough, dyspnea or wheezes Cardiovascular: Denies palpitation, chest discomfort or lower extremity swelling Gastrointestinal:  Denies nausea, heartburn or change in bowel habits Skin: Denies abnormal skin rashes Lymphatics: Denies new  lymphadenopathy or easy bruising Neurological:Denies numbness, tingling or new weaknesses Behavioral/Psych: Mood is stable, no new changes  All other systems were reviewed with the patient and are negative.  I have reviewed the past medical history, past surgical history, social history and family history with the patient and they are unchanged from previous note.  ALLERGIES:  is allergic to phenergan [promethazine hcl], heparin, and clindamycin.  MEDICATIONS:  Current Outpatient Medications  Medication Sig Dispense Refill   blood glucose meter kit and supplies KIT Dispense based on patient and insurance preference. Use up to four times daily as directed. (FOR ICD-9 250.00, 250.01). 1 each 1   cetirizine (ZYRTEC) 10 MG tablet Take 1 tablet (10 mg total) by mouth daily as needed for allergies. 100 tablet 0   dexamethasone (DECADRON) 4 MG tablet Take 1 tablet (4 mg total) by mouth daily for 3 days after chemo, every 2 weeks. 30 tablet 0   famotidine (PEPCID) 40 MG tablet Take 1 tablet (40 mg total) by mouth at bedtime. 30 tablet 3   levothyroxine (SYNTHROID) 175 MCG tablet Take 1 tablet (175 mcg total) by mouth daily before breakfast. 30 tablet 3   LORazepam (ATIVAN) 0.5 MG tablet Take 1 tablet (0.5 mg total) by mouth 2 (two) times daily as needed for anxiety. 30 tablet 0   metFORMIN (GLUCOPHAGE) 500 MG tablet Take 1 tablet (500 mg total) by mouth 2 (two) times daily with a meal. 60 tablet 2   methadone (DOLOPHINE) 10 MG tablet Take 1 tablet (10 mg) by mouth every 8 hours. 90 tablet 0   ondansetron (ZOFRAN) 8 MG tablet Take 1 tablet (8 mg total) by mouth every 8 (eight) hours as needed for nausea or  vomiting. 90 tablet 1   oxyCODONE (ROXICODONE) 15 MG immediate release tablet Take 1 tablet (15 mg total) by mouth every 4 (four) hours as needed for severe pain. 90 tablet 0   pantoprazole (PROTONIX) 40 MG tablet Take 1 tablet (40 mg total) by mouth daily. 30 tablet 2   polyethylene glycol (MIRALAX / GLYCOLAX) 17 g packet Take 17 g by mouth 2 (two) times daily.     senna-docusate (SENOKOT-S) 8.6-50 MG tablet Take 2 tablets by mouth 2 (two) times daily. 100 tablet 3   No current facility-administered medications for this visit.   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 Pan-endoscopy elsewhere showed cancer from nasopharyngeal space.   01/04/2007 - 02/20/2007 Chemotherapy   He received 2 cycles of cisplatin and 5FU followed by concurrent chemo with weekly cisplatin and radiation. He only received 2  doses of chemo due to severe mucositis, nausea and weight loss.   04/05/2007 - 08/04/2007 Chemotherapy   He received 4 more courses of cisplatin with 5FU and had complete response   07/05/2009 Procedure   Fine-needle aspirate of the right  level II lymph nodes come from recurrent metastatic disease. Repeat endoscopy and CT scan show no evidence of disease elsewhere.   07/08/2009 - 12/02/2009 Chemotherapy   He was given 6 cycles of carboplatin, 5-FU and docetaxel   12/03/2009 Surgery   He has surgery to the residual lymph node on the right neck which showed no evidence of disease.   02/22/2012 Imaging   Repeat imaging study showed large recurrent mass. He was referred elsewhere for further treatment.   05/03/2012 Surgery   He underwent left upper lobectomy.   04/29/2013 Imaging   PEt scan showed lesion on right level II B and lower lung was abnormal   06/03/2013 - 02/02/2014 Chemotherapy   He had 6 cycles of chemotherapy when he was found to have recurrence of cancer and had received oxaliplatin and capecitabine   06/07/2014 Imaging   PET CT scan showed persistent disease in the right neck lymph nodes and left lung   06/29/2014 Procedure   Accession: [id number] repeat LUL biopsy confirmed metastatic cancer   07/18/2014 - 07/31/2014 Radiation Therapy   He received palliative radiation therapy to the lungs   10/10/2014 Imaging   CT scan of the chest, abdomen and pelvis show regression in the size of the lung nodule in the left upper lobe and stable pulmonary nodules   01/24/2015 Imaging   CT scan showed stable disease in neck and lung   06/19/2015 Imaging   CT scan of the neck and the chest show possible mild progression of the nodule in the right side of the neck.   06/25/2015 Imaging   PET scan confirmed disease recurrence in the neck   07/07/2015 Imaging   He had MRI neck at [facility]   09/03/2015 - 08/26/2018 Chemotherapy   He received palliative chemo with Nivolumab   10/29/2015 Imaging   PET CT showed positive response to Rx   02/28/2016 Imaging   Ct abdomen showed abnormal thinkening in his stomach   03/03/2016 Imaging   CT: Right sternocleidomastoid muscle metastasis appears less distinct but otherwise not significantly  changed in size or configuration since 06/19/2015.2. Left level 3 lymph node which was hypermetabolic by PET-CT in January 2017 appears slightly smaller   04/01/2016 Imaging   CT cervical spine showed no acute fracture or traumatic malalignment in the cervical spine   04/22/2016 Procedure   Port-a-cath placed.   06/16/2016 Imaging   Ct neck showed right sternocleidomastoid muscle metastasis is further decreased in conspicuity since May, and has mildly decreased in size since September 2016. Continued stability of sub-centimeter left cervical lymph nodes. No new or progressive metastatic disease in the neck.   06/16/2016 Imaging   CT chest showed stable masslike radiation fibrosis in the left upper lobe. Stable subcentimeter pulmonary nodules in the bilateral lower lobes. No new or progressive metastatic disease in the chest. Nonobstructing left renal stone.   10/13/2016 Imaging   Ct neck showed unchanged right sternocleidomastoid muscle metastasis. Unchanged subcentimeter left cervical lymph nodes. No evidence of new or progressive metastatic disease in the neck.   10/13/2016 Imaging   CT chest showed tiny hypervascular foci in the liver, not definitely seen on prior imaging of 06/16/2016 and 02/28/2016. Abdomen MRI without and with contrast recommended to  further evaluate as metastatic disease is a concern. 2. Stable appearance of post treatment changes left upper lung and scattered tiny bilateral pulmonary nodules.   02/11/2017 Imaging   Ct neck: Lymph node mass right posterior neck appears improved from the prior study. Small posterior lymph nodes on the left unchanged. Occluded right jugular vein unchanged.   02/11/2017 Imaging   1. Similar appearance of postsurgical and radiation changes in the left upper lobe. 2. Similar bilateral pulmonary nodules. 3. No thoracic adenopathy. 4. Subtle foci of post-contrast enhancement within the liver are suboptimally characterized on this nondedicated  study. Likely similar. These could either be re-evaluated at followup or more entirely characterized with abdominal MRI. 5. Left nephrolithiasis.   05/19/2017 Imaging   Matted lymph node mass right posterior neck appears larger in the recent CT. Accurate measurements difficult due to infiltrating tumor margins and infiltration of the muscle. Right jugular vein again appears occluded or resected. Small left posterior lymph nodes stable. Left upper lobe airspace density stable and similar to the prior CT   06/03/2017 PET scan   1. Hypermetabolic ill-defined right level IIb lymph node, about 1.3 cm in diameter with maximum SUV 9.5 (formerly 8.1). Appearance suspicious for residual/recurrent malignancy. No worrisome left-sided lesion. 2. Left suprahilar indistinct opacity demonstrates no worrisome hypermetabolic activity. The 5 mm left lower lobe pulmonary nodule is stable and not currently hypermetabolic although below sensitive PET-CT size thresholds. 3. Other imaging findings of potential clinical significance: Bilateral nonobstructive nephrolithiasis. Chronic bilateral maxillary sinusitis.   05/11/2018 PET scan   1. Continued chronic accentuated metabolic activity in the vicinity of right level IIB and the adjacent right sternocleidomastoid muscle, with ill definition of surrounding tissue planes. Maximum SUV is currently 8.1, formerly 9.5. Accentuated metabolic activity is been present in this vicinity back through 06/25/2015, and there was also some low-level activity in this vicinity on 06/07/2014. Some of this may be from scarring and local muscular activity although clearly a component of residual tumor is difficult to exclude given the focally high activity. 2. Other imaging findings of potential clinical significance: Chronic bilateral maxillary sinusitis. Chronic scarring in the left upper lobe. Chronically stable 5 mm left lower lobe nodule is considered benign. Nonobstructive left  nephrolithiasis.   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 COMMENT:No significant keratinization is identified. Other basaloid carcinomas are in the differential diagnosis. No cell block material is available for further testing.   09/12/2018 Procedure   He underwent fine Needle Aspiration   10/04/2018 PET scan   1. Significant progression of local recurrence laterally in the mid right neck with an enlarging, increasingly hypermetabolic soft tissue mass. This involves the right sternocleidomastoid muscle. 2. Small lymph nodes in the right axilla are increasingly hypermetabolic. These are nonspecific and potentially reactive, although could reflect a small metastases. Small hypermetabolic nodule in the left suprasternal notch is unchanged. 3. No other evidence of metastatic disease.     10/07/2018 - 12/23/2018 Chemotherapy   The patient had cisplatin plus gemzar   12/07/2018 Imaging   1. Decreased size of lateral right neck mass. 2. Unchanged soft tissue nodule in the suprasternal notch. 3. No evidence of new metastatic disease in the neck.     05/30/2019 Imaging   CT neck No clear change or progression compared to the study of March. Overall measurements of the right lateral neck  mass are similar, approximately 3 x 1.8 cm. See above discussion. One could argue that there is slight increase in lateral bulging, possibly with an increase in contrast enhancement, towards the inferior margin. This is of questionable validity but could possibly represent some progression or inflammatory change. Other findings in the region are stable.   07/20/2019 - 09/15/2019 Chemotherapy   The patient had gemzar and cisplatin   10/02/2019 Imaging   CT neck As compared to 05/30/2019, no significant interval change in size of an ill-defined mass within the right  lateral neck, again measuring 3.3 x 1.8 cm in transaxial dimensions.   Unchanged mildly enlarged left level I lymph node measuring 1.1 cm in short axis.   Unchanged node or nodule at the thoracic inlet, measuring 1.3 x 0.8 cm.   Please refer to concurrently performed chest CT for a description of findings below the level of the thoracic inlet.     10/02/2019 Imaging   CT chest 1. No new or progressive findings in the chest to suggest metastatic disease. 2. Bilateral subcentimeter solid pulmonary nodules are stable since 2018. 3. Hyperdense 1.1 cm anterior liver focus, not clearly visualized on prior studies. Suggest MRI abdomen without and with IV contrast for further characterization.   10/20/2019 - 12/29/2019 Chemotherapy   The patient had gemzar maintenance   06/12/2020 Imaging   1. Enlarging superficial, exophytic component of the chronic right sternocleidomastoid muscle mass. See series 6, image 55. 2. Elsewhere stable CT appearance of the Neck.   06/12/2020 Imaging   Post treatment scarring in the left hemithorax, stable. No evidence recurrent or metastatic disease   06/14/2020 - 10/15/2020 Chemotherapy   He received carboplatin, 5FU and Rande Lawman       10/31/2020 Procedure   Interval improvement in right lateral lymph node mass. Improvement in dermal component as well as invasion of the right sternocleidomastoid muscle.   10 mm submental lymph node slightly enlarged compared to the prior study. Continued follow-up recommended.   11/01/2020 - 05/22/2022 Chemotherapy   Patient is on Treatment Plan : HEAD/NECK Pembrolizumab Q21D     07/03/2022 - 10/30/2022 Chemotherapy   Patient is on Treatment Plan : Head and neck Pembrolizumab (400) q42d     11/30/2022 Imaging   CT chest  1. Stable exam. No new or progressive findings to suggest recurrent or metastatic disease. 2. Stable tiny bilateral nodules since 06/12/2020, consistent with benign etiology. 3. Punctate nonobstructing left  renal stone.   12/01/2022 Imaging   CT neck  Growing right neck mass since 2022 with epidural tumor extension via the right C2-3 foramen. Cervical MRI with contrast would be contributory.   12/11/2022 - 04/02/2023 Chemotherapy   Patient is on Treatment Plan : HEAD/NECK toripalimab-tpzi D1 + cisplatin D1 + gemcitabine D1,8 q21d x 6 cycles / toripalimab-tpzi q21d (up to 39m)     04/15/2023 Imaging   CT Chest W Contrast  Result Date: 04/15/2023 CLINICAL DATA:  Right neck mass, nasopharyngeal carcinoma EXAM: CT CHEST WITH CONTRAST TECHNIQUE: Multidetector CT imaging of the chest was performed during intravenous contrast administration. RADIATION DOSE REDUCTION: This exam was performed according to the departmental dose-optimization program which includes automated exposure control, adjustment of the mA and/or kV according to patient size and/or use of iterative reconstruction technique. CONTRAST:  75mL OMNIPAQUE IOHEXOL 300 MG/ML  SOLN COMPARISON:  11/27/2022 FINDINGS: Cardiovascular: Right Port-A-Cath tip: Right atrium. Mediastinum/Nodes: Small left supraclavicular lymph node 0.5 cm in short axis on image 4 series 3, formerly the same.  No pathologic adenopathy in the chest. Lungs/Pleura: Biapical pleuroparenchymal scarring, right greater than left. Stable left upper lobe scarring and peribronchovascular density with wedge resection line in the left upper lobe, no significant contour change of density in this vicinity to suggest malignancy. Stable 5 by 4 mm left lower lobe nodule and stable 3 by 4 mm right lower lobe nodule, unchanged from at least 2021, compatible with benign etiology. No new lesion identified. Upper Abdomen: 2 mm left kidney upper pole nonobstructive renal calculus, image 43 series 7. Musculoskeletal: Unremarkable IMPRESSION: 1. Stable postoperative findings in the left upper lobe. 2. Stable small bilateral lower lobe pulmonary nodules, unchanged from at least 2021, compatible with benign  etiology. 3. 2 mm left kidney upper pole nonobstructive renal calculus. Electronically Signed   By: Gaylyn Rong M.D.   On: 04/15/2023 14:03   CT Soft Tissue Neck W Contrast  Result Date: 04/15/2023 CLINICAL DATA:  Head/neck cancer.  Last chemo 04/02/2023. EXAM: CT NECK WITH CONTRAST TECHNIQUE: Multidetector CT imaging of the neck was performed using the standard protocol following the bolus administration of intravenous contrast. RADIATION DOSE REDUCTION: This exam was performed according to the departmental dose-optimization program which includes automated exposure control, adjustment of the mA and/or kV according to patient size and/or use of iterative reconstruction technique. CONTRAST:  75mL OMNIPAQUE IOHEXOL 300 MG/ML  SOLN COMPARISON:  CT neck 11/27/2022. FINDINGS: Pharynx and larynx: There is probable mucoid debris in the right nasal cavity/nasopharynx. The nasal cavity and nasopharynx are otherwise unremarkable. The oral cavity and oropharynx are unremarkable. The parapharyngeal spaces are clear. The hypopharynx and larynx are unremarkable. The epiglottis is normal. There is no retropharyngeal collection. The airway is patent. Salivary glands: Atrophy of the parotid and left submandibular glands is unchanged. The right submandibular gland is again not identified and may be surgically absent. Thyroid: Unremarkable. Lymph nodes: The infiltrative mass in the right neck is again seen. The mass invades through the right C2-C3 neural foramen into the epidural space resulting in mild narrowing of the thecal sac without suspected cord compression. The degree of intraspinal epidural tumor appears slightly increased compared to the study from 11/27/2022. Mass again encases the vertebral artery at the C2-C3 level (4-36), unchanged. The remainder of the infiltrative mass in the paraspinal musculature with extension into the overlying skin surface does not appear significantly changed in size or extent  compared to the prior study. A 0.9 cm midline level I lymph node is unchanged going back to 2021. Otherwise, there is no new or progressive lymphadenopathy in the neck. Vascular: As above, there is encasement of the right vertebral artery at the C2-C3 level by the right neck mass. There is mild calcified plaque at the carotid bifurcations. The right IJ is occluded or sacrificed, unchanged. The left IJ is patent. A right chest wall port is partially imaged. Limited intracranial: Unremarkable. Visualized orbits: Unremarkable. Mastoids and visualized paranasal sinuses: There is mild mucosal thickening in the maxillary sinuses. The mastoid air cells and middle ear cavities are clear. Skeleton: There is no acute osseous abnormality or suspicious osseous lesion. Upper chest: Assessed on the separately dictated CT chest. Other: None. IMPRESSION: 1. Infiltrative mass again seen in the right neck centered in the paraspinal musculature with invasion through the right C2-C3 neural foramen. The bulk of the epidural tumor at this level appears slightly increased in size compared to the prior study from 11/27/2022 with probable leftward displacement of the cord but without frank cord compression. The remainder of  the mass in the right neck is otherwise not significantly changed. 2. No new or progressive lymphadenopathy in the remainder of the neck. 3. Probable mucoid debris in the right nasal cavity/nasopharynx. No suspicious mass lesion or enhancement in the nasopharynx. Electronically Signed   By: Lesia Hausen M.D.   On: 04/15/2023 14:02      04/30/2023 -  Chemotherapy   Patient is on Treatment Plan : HEAD/NECK Cetuximab q14d + Carboplatin + 5FU IVCI D1-4 q21d x 6 cycles / Cetuximab q14d       PHYSICAL EXAMINATION: ECOG PERFORMANCE STATUS: 1 - Symptomatic but completely ambulatory  Vitals:   04/16/23 0804  BP: (!) 136/3  Pulse: (!) 103  Resp: 18  Temp: 99.2 F (37.3 C)  SpO2: 100%   Filed Weights   04/16/23  0804  Weight: 191 lb (86.6 kg)    GENERAL:alert, no distress and comfortable SKIN: skin color, texture, turgor are normal, no rashes or significant lesions EYES: normal, Conjunctiva are pink and non-injected, sclera clear OROPHARYNX:no exudate, no erythema and lips, buccal mucosa, and tongue normal  NECK: He has neck mass consistent with progression of disease NEURO: alert & oriented x 3 with fluent speech, no focal motor/sensory deficits  LABORATORY DATA:  I have reviewed the data as listed    Component Value Date/Time   NA 139 04/16/2023 0743   NA 139 09/22/2017 0829   K 3.7 04/16/2023 0743   K 3.5 09/22/2017 0829   CL 102 04/16/2023 0743   CO2 30 04/16/2023 0743   CO2 26 09/22/2017 0829   GLUCOSE 161 (H) 04/16/2023 0743   GLUCOSE 133 09/22/2017 0829   BUN 15 04/16/2023 0743   BUN 14.1 09/22/2017 0829   CREATININE 0.89 04/16/2023 0743   CREATININE 0.9 09/22/2017 0829   CALCIUM 9.2 04/16/2023 0743   CALCIUM 9.1 09/22/2017 0829   PROT 6.8 04/16/2023 0743   PROT 6.8 09/22/2017 0829   ALBUMIN 4.2 04/16/2023 0743   ALBUMIN 4.1 09/22/2017 0829   AST 16 04/16/2023 0743   AST 22 09/22/2017 0829   ALT 20 04/16/2023 0743   ALT 30 09/22/2017 0829   ALKPHOS 69 04/16/2023 0743   ALKPHOS 55 09/22/2017 0829   BILITOT 0.3 04/16/2023 0743   BILITOT 0.35 09/22/2017 0829   GFRNONAA >60 04/16/2023 0743   GFRAA >60 07/05/2020 0845   GFRAA >60 02/06/2019 1215    No results found for: "SPEP", "UPEP"  Lab Results  Component Value Date   WBC 4.8 04/16/2023   NEUTROABS 2.4 04/16/2023   HGB 10.7 (L) 04/16/2023   HCT 32.9 (L) 04/16/2023   MCV 90.1 04/16/2023   PLT 218 04/16/2023      Chemistry      Component Value Date/Time   NA 139 04/16/2023 0743   NA 139 09/22/2017 0829   K 3.7 04/16/2023 0743   K 3.5 09/22/2017 0829   CL 102 04/16/2023 0743   CO2 30 04/16/2023 0743   CO2 26 09/22/2017 0829   BUN 15 04/16/2023 0743   BUN 14.1 09/22/2017 0829   CREATININE 0.89  04/16/2023 0743   CREATININE 0.9 09/22/2017 0829      Component Value Date/Time   CALCIUM 9.2 04/16/2023 0743   CALCIUM 9.1 09/22/2017 0829   ALKPHOS 69 04/16/2023 0743   ALKPHOS 55 09/22/2017 0829   AST 16 04/16/2023 0743   AST 22 09/22/2017 0829   ALT 20 04/16/2023 0743   ALT 30 09/22/2017 0829   BILITOT 0.3 04/16/2023 0743  BILITOT 0.35 09/22/2017 0829       RADIOGRAPHIC STUDIES: I have reviewed imaging study with the patient I have personally reviewed the radiological images as listed and agreed with the findings in the report. CT Chest W Contrast  Result Date: 04/15/2023 CLINICAL DATA:  Right neck mass, nasopharyngeal carcinoma EXAM: CT CHEST WITH CONTRAST TECHNIQUE: Multidetector CT imaging of the chest was performed during intravenous contrast administration. RADIATION DOSE REDUCTION: This exam was performed according to the departmental dose-optimization program which includes automated exposure control, adjustment of the mA and/or kV according to patient size and/or use of iterative reconstruction technique. CONTRAST:  75mL OMNIPAQUE IOHEXOL 300 MG/ML  SOLN COMPARISON:  11/27/2022 FINDINGS: Cardiovascular: Right Port-A-Cath tip: Right atrium. Mediastinum/Nodes: Small left supraclavicular lymph node 0.5 cm in short axis on image 4 series 3, formerly the same. No pathologic adenopathy in the chest. Lungs/Pleura: Biapical pleuroparenchymal scarring, right greater than left. Stable left upper lobe scarring and peribronchovascular density with wedge resection line in the left upper lobe, no significant contour change of density in this vicinity to suggest malignancy. Stable 5 by 4 mm left lower lobe nodule and stable 3 by 4 mm right lower lobe nodule, unchanged from at least 2021, compatible with benign etiology. No new lesion identified. Upper Abdomen: 2 mm left kidney upper pole nonobstructive renal calculus, image 43 series 7. Musculoskeletal: Unremarkable IMPRESSION: 1. Stable  postoperative findings in the left upper lobe. 2. Stable small bilateral lower lobe pulmonary nodules, unchanged from at least 2021, compatible with benign etiology. 3. 2 mm left kidney upper pole nonobstructive renal calculus. Electronically Signed   By: Gaylyn Rong M.D.   On: 04/15/2023 14:03   CT Soft Tissue Neck W Contrast  Result Date: 04/15/2023 CLINICAL DATA:  Head/neck cancer.  Last chemo 04/02/2023. EXAM: CT NECK WITH CONTRAST TECHNIQUE: Multidetector CT imaging of the neck was performed using the standard protocol following the bolus administration of intravenous contrast. RADIATION DOSE REDUCTION: This exam was performed according to the departmental dose-optimization program which includes automated exposure control, adjustment of the mA and/or kV according to patient size and/or use of iterative reconstruction technique. CONTRAST:  75mL OMNIPAQUE IOHEXOL 300 MG/ML  SOLN COMPARISON:  CT neck 11/27/2022. FINDINGS: Pharynx and larynx: There is probable mucoid debris in the right nasal cavity/nasopharynx. The nasal cavity and nasopharynx are otherwise unremarkable. The oral cavity and oropharynx are unremarkable. The parapharyngeal spaces are clear. The hypopharynx and larynx are unremarkable. The epiglottis is normal. There is no retropharyngeal collection. The airway is patent. Salivary glands: Atrophy of the parotid and left submandibular glands is unchanged. The right submandibular gland is again not identified and may be surgically absent. Thyroid: Unremarkable. Lymph nodes: The infiltrative mass in the right neck is again seen. The mass invades through the right C2-C3 neural foramen into the epidural space resulting in mild narrowing of the thecal sac without suspected cord compression. The degree of intraspinal epidural tumor appears slightly increased compared to the study from 11/27/2022. Mass again encases the vertebral artery at the C2-C3 level (4-36), unchanged. The remainder of the  infiltrative mass in the paraspinal musculature with extension into the overlying skin surface does not appear significantly changed in size or extent compared to the prior study. A 0.9 cm midline level I lymph node is unchanged going back to 2021. Otherwise, there is no new or progressive lymphadenopathy in the neck. Vascular: As above, there is encasement of the right vertebral artery at the C2-C3 level by the right  neck mass. There is mild calcified plaque at the carotid bifurcations. The right IJ is occluded or sacrificed, unchanged. The left IJ is patent. A right chest wall port is partially imaged. Limited intracranial: Unremarkable. Visualized orbits: Unremarkable. Mastoids and visualized paranasal sinuses: There is mild mucosal thickening in the maxillary sinuses. The mastoid air cells and middle ear cavities are clear. Skeleton: There is no acute osseous abnormality or suspicious osseous lesion. Upper chest: Assessed on the separately dictated CT chest. Other: None. IMPRESSION: 1. Infiltrative mass again seen in the right neck centered in the paraspinal musculature with invasion through the right C2-C3 neural foramen. The bulk of the epidural tumor at this level appears slightly increased in size compared to the prior study from 11/27/2022 with probable leftward displacement of the cord but without frank cord compression. The remainder of the mass in the right neck is otherwise not significantly changed. 2. No new or progressive lymphadenopathy in the remainder of the neck. 3. Probable mucoid debris in the right nasal cavity/nasopharynx. No suspicious mass lesion or enhancement in the nasopharynx. Electronically Signed   By: Lesia Hausen M.D.   On: 04/15/2023 14:02

## 2023-04-16 NOTE — Assessment & Plan Note (Signed)
I have reviewed imaging studies with the patient which showed disease progression We discussed his oncologic history and prior treatment The only chemotherapy regimen that he progress on are cisplatin and gemcitabine, along with 3 different immunotherapies including nivolumab, pembrolizumab and toripalimab Previously, he had good response to therapy with carboplatin, 5-FU, oxaliplatin, Xeloda, as well as Taxotere He has never received treatment with cetuximab We discussed the risk, benefits, side effects using combination of carboplatin, 5-FU and cetuximab followed by cetuximab maintenance in the future and he is in agreement to proceed We will get him scheduled to start treatment in 2 weeks I will see him on day 5 for toxicity review

## 2023-04-17 ENCOUNTER — Other Ambulatory Visit: Payer: Self-pay

## 2023-04-20 ENCOUNTER — Other Ambulatory Visit: Payer: Self-pay

## 2023-04-20 ENCOUNTER — Other Ambulatory Visit (HOSPITAL_BASED_OUTPATIENT_CLINIC_OR_DEPARTMENT_OTHER): Payer: Self-pay

## 2023-04-20 ENCOUNTER — Other Ambulatory Visit: Payer: Self-pay | Admitting: Hematology and Oncology

## 2023-04-20 ENCOUNTER — Telehealth: Payer: Self-pay

## 2023-04-20 MED ORDER — OXYCODONE HCL 15 MG PO TABS
15.0000 mg | ORAL_TABLET | ORAL | 0 refills | Status: DC | PRN
  Filled 2023-04-20: qty 90, 15d supply, fill #0

## 2023-04-20 NOTE — Telephone Encounter (Signed)
 He called and left a message requesting Oxycodone refill to [facility] please.

## 2023-04-20 NOTE — Telephone Encounter (Signed)
 Called and told Rx sent. He verbalized understanding.

## 2023-04-20 NOTE — Telephone Encounter (Signed)
done

## 2023-04-21 ENCOUNTER — Other Ambulatory Visit: Payer: Self-pay

## 2023-04-29 ENCOUNTER — Other Ambulatory Visit (HOSPITAL_BASED_OUTPATIENT_CLINIC_OR_DEPARTMENT_OTHER): Payer: Self-pay

## 2023-04-29 ENCOUNTER — Other Ambulatory Visit: Payer: Self-pay

## 2023-04-29 ENCOUNTER — Other Ambulatory Visit (HOSPITAL_COMMUNITY): Payer: Self-pay

## 2023-04-29 ENCOUNTER — Telehealth: Payer: Self-pay | Admitting: Hematology and Oncology

## 2023-04-29 ENCOUNTER — Other Ambulatory Visit: Payer: Self-pay | Admitting: Hematology

## 2023-04-29 MED ORDER — LORAZEPAM 0.5 MG PO TABS
0.5000 mg | ORAL_TABLET | Freq: Two times a day (BID) | ORAL | 0 refills | Status: DC | PRN
  Filled 2023-04-29 (×2): qty 30, 15d supply, fill #0

## 2023-04-29 MED FILL — Dexamethasone Sodium Phosphate Inj 100 MG/10ML: INTRAMUSCULAR | Qty: 1 | Status: AC

## 2023-04-29 MED FILL — Fosaprepitant Dimeglumine For IV Infusion 150 MG (Base Eq): INTRAVENOUS | Qty: 5 | Status: AC

## 2023-04-29 NOTE — Telephone Encounter (Signed)
Pt. Called today for ativan refill. Pt stated he had an anxiety attack after he tried to use the bathroom. Pt stated he was out of his medication and would like a refill. Informed Pt to take miralax prescribed by provider and increase oral intake discussed with Pt if he had anything else to take for constipation and if this doesn't work to return call to the office.

## 2023-04-30 ENCOUNTER — Inpatient Hospital Stay: Payer: Medicaid Other

## 2023-04-30 ENCOUNTER — Telehealth: Payer: Self-pay | Admitting: Hematology and Oncology

## 2023-04-30 MED ORDER — SODIUM CHLORIDE 0.9% FLUSH: 10.0000 mL | Freq: Once | INTRAVENOUS | Status: DC

## 2023-04-30 NOTE — Telephone Encounter (Signed)
THis nurse called pt to f/u about appt for chemotherapy scheduled for this morning. Pt stated that he has been constipated for 7 days. Pt then stated that he had to use digital impaction removal to have relief of constipation. Educated pt again on relieving constipation and having an ED visit for firther evaluation. Pt declined going to ED. Pt also c/o having anxiety attacks that started on Wednesday and also slept for days (2). Pt described how he felt as if he had "anaesthesia." Reviewed pt med list and how medications are being taken. Pt stated that he takes methadone, oxycodone, ativan, tylenol and advil at night. Educated pt on how to space medications out and if they are not needed to hold it. Also, educated pt again on taking narcotics and constipation being a side effect of constipation. Pt verbalized understanding and advised to f/u as scheduled

## 2023-05-03 ENCOUNTER — Other Ambulatory Visit (HOSPITAL_COMMUNITY): Payer: Self-pay

## 2023-05-04 ENCOUNTER — Inpatient Hospital Stay: Payer: Medicaid Other

## 2023-05-04 ENCOUNTER — Other Ambulatory Visit: Payer: Self-pay

## 2023-05-04 ENCOUNTER — Other Ambulatory Visit (HOSPITAL_BASED_OUTPATIENT_CLINIC_OR_DEPARTMENT_OTHER): Payer: Self-pay

## 2023-05-04 ENCOUNTER — Encounter: Payer: Self-pay | Admitting: Hematology and Oncology

## 2023-05-04 ENCOUNTER — Inpatient Hospital Stay: Payer: Medicaid Other | Admitting: Hematology and Oncology

## 2023-05-04 VITALS — BP 121/76 | HR 82 | Temp 98.3°F | Resp 18 | Ht 69.0 in | Wt 190.0 lb

## 2023-05-04 DIAGNOSIS — K5909 Other constipation: Secondary | ICD-10-CM

## 2023-05-04 DIAGNOSIS — C760 Malignant neoplasm of head, face and neck: Secondary | ICD-10-CM | POA: Diagnosis not present

## 2023-05-04 DIAGNOSIS — C119 Malignant neoplasm of nasopharynx, unspecified: Secondary | ICD-10-CM | POA: Diagnosis not present

## 2023-05-04 DIAGNOSIS — F411 Generalized anxiety disorder: Secondary | ICD-10-CM | POA: Diagnosis not present

## 2023-05-04 LAB — MAGNESIUM: Magnesium: 1.9 mg/dL (ref 1.7–2.4)

## 2023-05-04 LAB — CBC WITH DIFFERENTIAL (CANCER CENTER ONLY)
Abs Immature Granulocytes: 0.01 10*3/uL (ref 0.00–0.07)
Basophils Absolute: 0 10*3/uL (ref 0.0–0.1)
Basophils Relative: 1 %
Eosinophils Absolute: 0.1 10*3/uL (ref 0.0–0.5)
Eosinophils Relative: 2 %
HCT: 40.1 % (ref 39.0–52.0)
Hemoglobin: 11.4 g/dL — ABNORMAL LOW (ref 13.0–17.0)
Immature Granulocytes: 0 %
Lymphocytes Relative: 27 %
Lymphs Abs: 1.6 10*3/uL (ref 0.7–4.0)
MCH: 30 pg (ref 26.0–34.0)
MCHC: 28.4 g/dL — ABNORMAL LOW (ref 30.0–36.0)
MCV: 105.5 fL — ABNORMAL HIGH (ref 80.0–100.0)
Monocytes Absolute: 0.4 10*3/uL (ref 0.1–1.0)
Monocytes Relative: 7 %
Neutro Abs: 3.9 10*3/uL (ref 1.7–7.7)
Neutrophils Relative %: 63 %
Platelet Count: 164 10*3/uL (ref 150–400)
RBC: 3.8 MIL/uL — ABNORMAL LOW (ref 4.22–5.81)
RDW: 16 % — ABNORMAL HIGH (ref 11.5–15.5)
WBC Count: 6.2 10*3/uL (ref 4.0–10.5)
nRBC: 0 % (ref 0.0–0.2)

## 2023-05-04 LAB — CMP (CANCER CENTER ONLY)
ALT: 14 U/L (ref 0–44)
AST: 19 U/L (ref 15–41)
Albumin: 4.3 g/dL (ref 3.5–5.0)
Alkaline Phosphatase: 65 U/L (ref 38–126)
Anion gap: 7 (ref 5–15)
BUN: 15 mg/dL (ref 6–20)
CO2: 33 mmol/L — ABNORMAL HIGH (ref 22–32)
Calcium: 9.6 mg/dL (ref 8.9–10.3)
Chloride: 101 mmol/L (ref 98–111)
Creatinine: 0.88 mg/dL (ref 0.61–1.24)
GFR, Estimated: >60 mL/min (ref >60)
Glucose, Bld: 160 mg/dL — ABNORMAL HIGH (ref 70–99)
Potassium: 3.8 mmol/L (ref 3.5–5.1)
Sodium: 141 mmol/L (ref 135–145)
Total Bilirubin: 0.2 mg/dL — ABNORMAL LOW (ref 0.3–1.2)
Total Protein: 7.1 g/dL (ref 6.5–8.1)

## 2023-05-04 MED ORDER — MAGNESIUM CITRATE PO SOLN
1.0000 | Freq: Once | ORAL | 0 refills | Status: AC
  Filled 2023-05-04: qty 296, 1d supply, fill #0

## 2023-05-04 NOTE — Assessment & Plan Note (Signed)
The patient is supposed to start chemotherapy last week but called to cancel due to panic attack We will try to get his chemotherapy rescheduled to start this week I will get his labs drawn today I will see him back in 2 weeks for further follow-up

## 2023-05-04 NOTE — Assessment & Plan Note (Signed)
He is profoundly constipated for 1 week He is not taking his laxatives as prescribed I recommend the patient to increase MiraLAX to twice daily, to take Senokot twice daily as prescribed along with magnesium citrate Will call him tomorrow and daily to assess for efficacy

## 2023-05-04 NOTE — Assessment & Plan Note (Signed)
He has significant anxiety I refilled his prescription lorazepam He felt better while he is on treatment We will try to get his treatment rescheduled

## 2023-05-04 NOTE — Progress Notes (Signed)
[facility] OFFICE PROGRESS NOTE  Patient Care Team: Artis Delay, MD as PCP - General (Hematology and Oncology) Artis Delay, MD as Consulting Physician (Hematology and Oncology) Elfredia Nevins, MD as Referring Physician (Plastic Surgery) Hilarie Fredrickson, MD as Consulting Physician (Gastroenterology)  ASSESSMENT & PLAN:  Malignant neoplasm of head and neck [facility]) The patient is supposed to start chemotherapy last week but called to cancel due to panic attack We will try to get his chemotherapy rescheduled to start this week I will get his labs drawn today I will see him back in 2 weeks for further follow-up  Other constipation He is profoundly constipated for 1 week He is not taking his laxatives as prescribed I recommend the patient to increase MiraLAX to twice daily, to take Senokot twice daily as prescribed along with magnesium citrate Will call him tomorrow and daily to assess for efficacy  Anxiety, generalized He has significant anxiety I refilled his prescription lorazepam He felt better while he is on treatment We will try to get his treatment rescheduled  Orders Placed This Encounter  Procedures   CMP (Cancer Center only)    Standing Status:   Future    Number of Occurrences:   1    Standing Expiration Date:   05/03/2024   CBC with Differential (Cancer Center Only)    Standing Status:   Future    Number of Occurrences:   1    Standing Expiration Date:   05/03/2024   Magnesium    Standing Status:   Future    Number of Occurrences:   1    Standing Expiration Date:   05/03/2024    All questions were answered. The patient knows to call the clinic with any problems, questions or concerns. The total time spent in the appointment was 30 minutes encounter with patients including review of chart and various tests results, discussions about plan of care and coordination of care plan   Artis Delay, MD 05/04/2023 10:43 AM  INTERVAL HISTORY: Please see below for  problem oriented charting. he returns for treatment follow-up to be seen urgently He was supposed to start chemo last week but canceled due to a panic attack He is profoundly constipated and have no bowel movement for 1 week He denies nausea He is taking only MiraLAX sporadically His anxiety is better since he stopped taking lorazepam He is begging to for his appointment to be rescheduled  REVIEW OF SYSTEMS:   Constitutional: Denies fevers, chills or abnormal weight loss Eyes: Denies blurriness of vision Ears, nose, mouth, throat, and face: Denies mucositis or sore throat Respiratory: Denies cough, dyspnea or wheezes Cardiovascular: Denies palpitation, chest discomfort or lower extremity swelling Skin: Denies abnormal skin rashes Lymphatics: Denies new lymphadenopathy or easy bruising Neurological:Denies numbness, tingling or new weaknesses Behavioral/Psych: Mood is stable, no new changes  All other systems were reviewed with the patient and are negative.  I have reviewed the past medical history, past surgical history, social history and family history with the patient and they are unchanged from previous note.  ALLERGIES:  is allergic to phenergan [promethazine hcl], heparin, and clindamycin.  MEDICATIONS:  Current Outpatient Medications  Medication Sig Dispense Refill   magnesium citrate SOLN Take 296 mLs (1 bottle total) by mouth once for 1 dose. 296 mL 0   blood glucose meter kit and supplies KIT Dispense based on patient and insurance preference. Use up to four times daily as directed. (FOR ICD-9 250.00, 250.01). 1 each  1   cetirizine (ZYRTEC) 10 MG tablet Take 1 tablet (10 mg total) by mouth daily as needed for allergies. 100 tablet 0   dexamethasone (DECADRON) 4 MG tablet Take 1 tablet (4 mg total) by mouth daily for 3 days after chemo, every 2 weeks. 30 tablet 0   famotidine (PEPCID) 40 MG tablet Take 1 tablet (40 mg total) by mouth at bedtime. 30 tablet 3   levothyroxine  (SYNTHROID) 175 MCG tablet Take 1 tablet (175 mcg total) by mouth daily before breakfast. 30 tablet 3   LORazepam (ATIVAN) 0.5 MG tablet Take 1 tablet (0.5 mg total) by mouth 2 (two) times daily as needed for anxiety. 30 tablet 0   metFORMIN (GLUCOPHAGE) 500 MG tablet Take 1 tablet (500 mg total) by mouth 2 (two) times daily with a meal. 60 tablet 2   methadone (DOLOPHINE) 10 MG tablet Take 1 tablet (10 mg) by mouth every 8 hours. 90 tablet 0   ondansetron (ZOFRAN) 8 MG tablet Take 1 tablet (8 mg total) by mouth every 8 (eight) hours as needed for nausea or vomiting. 90 tablet 1   oxyCODONE (ROXICODONE) 15 MG immediate release tablet Take 1 tablet (15 mg total) by mouth every 4 (four) hours as needed. 90 tablet 0   pantoprazole (PROTONIX) 40 MG tablet Take 1 tablet (40 mg total) by mouth daily. 30 tablet 2   polyethylene glycol (MIRALAX / GLYCOLAX) 17 g packet Take 17 g by mouth 2 (two) times daily.     senna-docusate (SENOKOT-S) 8.6-50 MG tablet Take 2 tablets by mouth 2 (two) times daily. 100 tablet 3   No current facility-administered medications for this visit.   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 Pan-endoscopy elsewhere showed cancer from nasopharyngeal space.   01/04/2007 - 02/20/2007 Chemotherapy   He received 2 cycles of cisplatin and 5FU followed by concurrent chemo with weekly cisplatin and radiation. He only received 2 doses of chemo due to severe mucositis, nausea and weight loss.   04/05/2007 - 08/04/2007 Chemotherapy   He received 4 more courses of cisplatin with 5FU and had complete response   07/05/2009 Procedure   Fine-needle aspirate of the right level II lymph nodes come from recurrent metastatic disease. Repeat endoscopy and CT scan show no evidence of disease elsewhere.   07/08/2009 - 12/02/2009 Chemotherapy   He was given 6 cycles of carboplatin, 5-FU and docetaxel   12/03/2009 Surgery  He has surgery to the residual lymph node on the right neck which showed no evidence of disease.   02/22/2012 Imaging   Repeat imaging study showed large recurrent mass. He was referred elsewhere for further treatment.   05/03/2012 Surgery   He underwent left upper lobectomy.   04/29/2013 Imaging   PEt scan showed lesion on right level II B and lower lung was abnormal   06/03/2013 - 02/02/2014 Chemotherapy   He had 6 cycles of chemotherapy when he was found to have recurrence of cancer and had received oxaliplatin and capecitabine   06/07/2014 Imaging   PET CT scan showed persistent disease in the right neck lymph nodes and left lung   06/29/2014 Procedure   Accession: [id number] repeat LUL biopsy confirmed metastatic cancer   07/18/2014 - 07/31/2014 Radiation Therapy   He received palliative radiation therapy to the lungs   10/10/2014 Imaging   CT scan of the chest, abdomen and pelvis show regression in the size of the lung nodule in the left upper lobe and stable pulmonary nodules   01/24/2015 Imaging   CT  scan showed stable disease in neck and lung   06/19/2015 Imaging   CT scan of the neck and the chest show possible mild progression of the nodule in the right side of the neck.   06/25/2015 Imaging   PET scan confirmed disease recurrence in the neck   07/07/2015 Imaging   [facility] neck [facility]   09/03/2015 - 08/26/2018 Chemotherapy   He received palliative chemo with Nivolumab   10/29/2015 Imaging   PET CT showed positive response to Rx   02/28/2016 Imaging   Ct abdomen showed abnormal thinkening in his stomach   03/03/2016 Imaging   CT: Right sternocleidomastoid muscle metastasis appears less distinct but otherwise not significantly changed in size or configuration since 06/19/2015.2. Left level 3 lymph node which was hypermetabolic by PET-CT in January 2017 appears slightly smaller   04/01/2016 Imaging   CT cervical spine showed no acute fracture or traumatic malalignment in the cervical spine   04/22/2016 Procedure   Port-a-cath placed.   06/16/2016 Imaging   Ct neck showed right sternocleidomastoid muscle metastasis is further decreased in conspicuity since May, and has mildly decreased in size since September 2016. Continued stability of sub-centimeter left cervical lymph nodes. No new or progressive metastatic disease in the neck.   06/16/2016 Imaging   CT chest showed stable masslike radiation fibrosis in the left upper lobe. Stable subcentimeter pulmonary nodules in the bilateral lower lobes. No new or progressive metastatic disease in the chest. Nonobstructing left renal stone.   10/13/2016 Imaging   Ct neck showed unchanged right sternocleidomastoid muscle metastasis. Unchanged subcentimeter left cervical lymph nodes. No evidence of new or progressive metastatic disease in the neck.   10/13/2016 Imaging   CT chest showed tiny hypervascular foci in the liver, not definitely seen on prior imaging of 06/16/2016 and 02/28/2016. Abdomen MRI without and with contrast recommended to  further evaluate as metastatic disease is a concern. 2. Stable appearance of post treatment changes left upper lung and scattered tiny bilateral pulmonary nodules.   02/11/2017 Imaging   Ct neck: Lymph node mass right posterior neck appears improved from the prior study. Small posterior lymph nodes on the left unchanged. Occluded right jugular vein unchanged.   02/11/2017 Imaging   1. Similar appearance of postsurgical and radiation changes in the left upper lobe. 2. Similar bilateral pulmonary nodules. 3. No thoracic adenopathy. 4. Subtle  foci of post-contrast enhancement within the liver are suboptimally characterized on this nondedicated study. Likely similar. These could either be re-evaluated at followup or more entirely characterized with abdominal MRI. 5. Left nephrolithiasis.   05/19/2017 Imaging   Matted lymph node mass right posterior neck appears larger in the recent CT. Accurate measurements difficult due to infiltrating tumor margins and infiltration of the muscle. Right jugular vein again appears occluded or resected. Small left posterior lymph nodes stable. Left upper lobe airspace density stable and similar to the prior CT   06/03/2017 PET scan   1. Hypermetabolic ill-defined right level IIb lymph node, about 1.3 cm in diameter with maximum SUV 9.5 (formerly 8.1). Appearance suspicious for residual/recurrent malignancy. No worrisome left-sided lesion. 2. Left suprahilar indistinct opacity demonstrates no worrisome hypermetabolic activity. The 5 mm left lower lobe pulmonary nodule is stable and not currently hypermetabolic although below sensitive PET-CT size thresholds. 3. Other imaging findings of potential clinical significance: Bilateral nonobstructive nephrolithiasis. Chronic bilateral maxillary sinusitis.   05/11/2018 PET scan   1. Continued chronic accentuated metabolic activity in the vicinity of right level IIB and the adjacent right sternocleidomastoid muscle, with ill  definition of surrounding tissue planes. Maximum SUV is currently 8.1, formerly 9.5. Accentuated metabolic activity is been present in this vicinity back through 06/25/2015, and there was also some low-level activity in this vicinity on 06/07/2014. Some of this may be from scarring and local muscular activity although clearly a component of residual tumor is difficult to exclude given the focally high activity. 2. Other imaging findings of potential clinical significance: Chronic bilateral maxillary sinusitis. Chronic scarring in the left upper lobe. Chronically stable 5 mm left lower lobe nodule is considered benign. Nonobstructive left nephrolithiasis.   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 COMMENT:No significant keratinization is identified. Other basaloid carcinomas are in the differential diagnosis. No cell block material is available for further testing.   09/12/2018 Procedure   He underwent fine Needle Aspiration   10/04/2018 PET scan   1. Significant progression of local recurrence laterally in the mid right neck with an enlarging, increasingly hypermetabolic soft tissue mass. This involves the right sternocleidomastoid muscle. 2. Small lymph nodes in the right axilla are increasingly hypermetabolic. These are nonspecific and potentially reactive, although could reflect a small metastases. Small hypermetabolic nodule in the left suprasternal notch is unchanged. 3. No other evidence of metastatic disease.     10/07/2018 - 12/23/2018 Chemotherapy   The patient had cisplatin plus gemzar   12/07/2018 Imaging   1. Decreased size of lateral right neck mass. 2. Unchanged soft tissue nodule in the suprasternal notch. 3. No evidence of new metastatic disease in the neck.     05/30/2019 Imaging   CT neck No clear change or progression compared  to the study of March. Overall measurements of the right lateral neck mass are similar, approximately 3 x 1.8 cm. See above discussion. One could argue that there is slight increase in lateral bulging, possibly with an increase in contrast enhancement, towards the inferior margin. This is of questionable validity but could possibly represent some progression or inflammatory change. Other findings in the region are stable.   07/20/2019 - 09/15/2019 Chemotherapy   The patient had gemzar and cisplatin   10/02/2019 Imaging   CT neck As compared to 05/30/2019, no significant interval change in size of  an ill-defined mass within the right lateral neck, again measuring 3.3 x 1.8 cm in transaxial dimensions.   Unchanged mildly enlarged left level I lymph node measuring 1.1 cm in short axis.   Unchanged node or nodule at the thoracic inlet, measuring 1.3 x 0.8 cm.   Please refer to concurrently performed chest CT for a description of findings below the level of the thoracic inlet.     10/02/2019 Imaging   CT chest 1. No new or progressive findings in the chest to suggest metastatic disease. 2. Bilateral subcentimeter solid pulmonary nodules are stable since 2018. 3. Hyperdense 1.1 cm anterior liver focus, not clearly visualized on prior studies. Suggest MRI abdomen without and with IV contrast for further characterization.   10/20/2019 - 12/29/2019 Chemotherapy   The patient had gemzar maintenance   06/12/2020 Imaging   1. Enlarging superficial, exophytic component of the chronic right sternocleidomastoid muscle mass. See series 6, image 55. 2. Elsewhere stable CT appearance of the Neck.   06/12/2020 Imaging   Post treatment scarring in the left hemithorax, stable. No evidence recurrent or metastatic disease   06/14/2020 - 10/15/2020 Chemotherapy   He received carboplatin, 5FU and Rande Lawman       10/31/2020 Procedure   Interval improvement in right lateral lymph node mass. Improvement in dermal  component as well as invasion of the right sternocleidomastoid muscle.   10 mm submental lymph node slightly enlarged compared to the prior study. Continued follow-up recommended.   11/01/2020 - 05/22/2022 Chemotherapy   Patient is on Treatment Plan : HEAD/NECK Pembrolizumab Q21D     07/03/2022 - 10/30/2022 Chemotherapy   Patient is on Treatment Plan : Head and neck Pembrolizumab (400) q42d     11/30/2022 Imaging   CT chest  1. Stable exam. No new or progressive findings to suggest recurrent or metastatic disease. 2. Stable tiny bilateral nodules since 06/12/2020, consistent with benign etiology. 3. Punctate nonobstructing left renal stone.   12/01/2022 Imaging   CT neck  Growing right neck mass since 2022 with epidural tumor extension via the right C2-3 foramen. Cervical MRI with contrast would be contributory.   12/11/2022 - 04/02/2023 Chemotherapy   Patient is on Treatment Plan : HEAD/NECK toripalimab-tpzi D1 + cisplatin D1 + gemcitabine D1,8 q21d x 6 cycles / toripalimab-tpzi q21d (up to 63m)     04/15/2023 Imaging   CT Chest W Contrast  Result Date: 04/15/2023 CLINICAL DATA:  Right neck mass, nasopharyngeal carcinoma EXAM: CT CHEST WITH CONTRAST TECHNIQUE: Multidetector CT imaging of the chest was performed during intravenous contrast administration. RADIATION DOSE REDUCTION: This exam was performed according to the departmental dose-optimization program which includes automated exposure control, adjustment of the mA and/or kV according to patient size and/or use of iterative reconstruction technique. CONTRAST:  75mL OMNIPAQUE IOHEXOL 300 MG/ML  SOLN COMPARISON:  11/27/2022 FINDINGS: Cardiovascular: Right Port-A-Cath tip: Right atrium. Mediastinum/Nodes: Small left supraclavicular lymph node 0.5 cm in short axis on image 4 series 3, formerly the same. No pathologic adenopathy in the chest. Lungs/Pleura: Biapical pleuroparenchymal scarring, right greater than left. Stable left upper lobe  scarring and peribronchovascular density with wedge resection line in the left upper lobe, no significant contour change of density in this vicinity to suggest malignancy. Stable 5 by 4 mm left lower lobe nodule and stable 3 by 4 mm right lower lobe nodule, unchanged from at least 2021, compatible with benign etiology. No new lesion identified. Upper Abdomen: 2 mm left kidney upper pole nonobstructive renal calculus, image  43 series 7. Musculoskeletal: Unremarkable IMPRESSION: 1. Stable postoperative findings in the left upper lobe. 2. Stable small bilateral lower lobe pulmonary nodules, unchanged from at least 2021, compatible with benign etiology. 3. 2 mm left kidney upper pole nonobstructive renal calculus. Electronically Signed   By: Gaylyn Rong M.D.   On: 04/15/2023 14:03   CT Soft Tissue Neck W Contrast  Result Date: 04/15/2023 CLINICAL DATA:  Head/neck cancer.  Last chemo 04/02/2023. EXAM: CT NECK WITH CONTRAST TECHNIQUE: Multidetector CT imaging of the neck was performed using the standard protocol following the bolus administration of intravenous contrast. RADIATION DOSE REDUCTION: This exam was performed according to the departmental dose-optimization program which includes automated exposure control, adjustment of the mA and/or kV according to patient size and/or use of iterative reconstruction technique. CONTRAST:  75mL OMNIPAQUE IOHEXOL 300 MG/ML  SOLN COMPARISON:  CT neck 11/27/2022. FINDINGS: Pharynx and larynx: There is probable mucoid debris in the right nasal cavity/nasopharynx. The nasal cavity and nasopharynx are otherwise unremarkable. The oral cavity and oropharynx are unremarkable. The parapharyngeal spaces are clear. The hypopharynx and larynx are unremarkable. The epiglottis is normal. There is no retropharyngeal collection. The airway is patent. Salivary glands: Atrophy of the parotid and left submandibular glands is unchanged. The right submandibular gland is again not  identified and may be surgically absent. Thyroid: Unremarkable. Lymph nodes: The infiltrative mass in the right neck is again seen. The mass invades through the right C2-C3 neural foramen into the epidural space resulting in mild narrowing of the thecal sac without suspected cord compression. The degree of intraspinal epidural tumor appears slightly increased compared to the study from 11/27/2022. Mass again encases the vertebral artery at the C2-C3 level (4-36), unchanged. The remainder of the infiltrative mass in the paraspinal musculature with extension into the overlying skin surface does not appear significantly changed in size or extent compared to the prior study. A 0.9 cm midline level I lymph node is unchanged going back to 2021. Otherwise, there is no new or progressive lymphadenopathy in the neck. Vascular: As above, there is encasement of the right vertebral artery at the C2-C3 level by the right neck mass. There is mild calcified plaque at the carotid bifurcations. The right IJ is occluded or sacrificed, unchanged. The left IJ is patent. A right chest wall port is partially imaged. Limited intracranial: Unremarkable. Visualized orbits: Unremarkable. Mastoids and visualized paranasal sinuses: There is mild mucosal thickening in the maxillary sinuses. The mastoid air cells and middle ear cavities are clear. Skeleton: There is no acute osseous abnormality or suspicious osseous lesion. Upper chest: Assessed on the separately dictated CT chest. Other: None. IMPRESSION: 1. Infiltrative mass again seen in the right neck centered in the paraspinal musculature with invasion through the right C2-C3 neural foramen. The bulk of the epidural tumor at this level appears slightly increased in size compared to the prior study from 11/27/2022 with probable leftward displacement of the cord but without frank cord compression. The remainder of the mass in the right neck is otherwise not significantly changed. 2. No new or  progressive lymphadenopathy in the remainder of the neck. 3. Probable mucoid debris in the right nasal cavity/nasopharynx. No suspicious mass lesion or enhancement in the nasopharynx. Electronically Signed   By: Lesia Hausen M.D.   On: 04/15/2023 14:02      05/07/2023 -  Chemotherapy   Patient is on Treatment Plan : HEAD/NECK Cetuximab q14d + Carboplatin + 5FU IVCI D1-4 q21d x 6 cycles / Cetuximab  q14d       PHYSICAL EXAMINATION: ECOG PERFORMANCE STATUS: 1 - Symptomatic but completely ambulatory  Vitals:   05/04/23 0916  BP: 121/76  Pulse: 82  Resp: 18  Temp: 98.3 F (36.8 C)  SpO2: 100%   Filed Weights   05/04/23 0916  Weight: 190 lb (86.2 kg)    GENERAL:alert, no distress and comfortable  NEURO: alert & oriented x 3 with fluent speech, no focal motor/sensory deficits  LABORATORY DATA:  I have reviewed the data as listed    Component Value Date/Time   NA 141 05/04/2023 0951   NA 139 09/22/2017 0829   K 3.8 05/04/2023 0951   K 3.5 09/22/2017 0829   CL 101 05/04/2023 0951   CO2 33 (H) 05/04/2023 0951   CO2 26 09/22/2017 0829   GLUCOSE 160 (H) 05/04/2023 0951   GLUCOSE 133 09/22/2017 0829   BUN 15 05/04/2023 0951   BUN 14.1 09/22/2017 0829   CREATININE 0.88 05/04/2023 0951   CREATININE 0.9 09/22/2017 0829   CALCIUM 9.6 05/04/2023 0951   CALCIUM 9.1 09/22/2017 0829   PROT 7.1 05/04/2023 0951   PROT 6.8 09/22/2017 0829   ALBUMIN 4.3 05/04/2023 0951   ALBUMIN 4.1 09/22/2017 0829   AST 19 05/04/2023 0951   AST 22 09/22/2017 0829   ALT 14 05/04/2023 0951   ALT 30 09/22/2017 0829   ALKPHOS 65 05/04/2023 0951   ALKPHOS 55 09/22/2017 0829   BILITOT 0.2 (L) 05/04/2023 0951   BILITOT 0.35 09/22/2017 0829   GFRNONAA >60 05/04/2023 0951   GFRAA >60 07/05/2020 0845   GFRAA >60 02/06/2019 1215    No results found for: "SPEP", "UPEP"  Lab Results  Component Value Date   WBC 6.2 05/04/2023   NEUTROABS 3.9 05/04/2023   HGB 11.4 (L) 05/04/2023   HCT 40.1 05/04/2023    MCV 105.5 (H) 05/04/2023   PLT 164 05/04/2023      Chemistry      Component Value Date/Time   NA 141 05/04/2023 0951   NA 139 09/22/2017 0829   K 3.8 05/04/2023 0951   K 3.5 09/22/2017 0829   CL 101 05/04/2023 0951   CO2 33 (H) 05/04/2023 0951   CO2 26 09/22/2017 0829   BUN 15 05/04/2023 0951   BUN 14.1 09/22/2017 0829   CREATININE 0.88 05/04/2023 0951   CREATININE 0.9 09/22/2017 0829      Component Value Date/Time   CALCIUM 9.6 05/04/2023 0951   CALCIUM 9.1 09/22/2017 0829   ALKPHOS 65 05/04/2023 0951   ALKPHOS 55 09/22/2017 0829   AST 19 05/04/2023 0951   AST 22 09/22/2017 0829   ALT 14 05/04/2023 0951   ALT 30 09/22/2017 0829   BILITOT 0.2 (L) 05/04/2023 0951   BILITOT 0.35 09/22/2017 0829

## 2023-05-05 ENCOUNTER — Other Ambulatory Visit (HOSPITAL_BASED_OUTPATIENT_CLINIC_OR_DEPARTMENT_OTHER): Payer: Self-pay

## 2023-05-05 ENCOUNTER — Encounter: Payer: Self-pay | Admitting: Hematology and Oncology

## 2023-05-05 ENCOUNTER — Telehealth: Payer: Self-pay | Admitting: *Deleted

## 2023-05-05 NOTE — Telephone Encounter (Addendum)
-----   Message from Artis Delay sent at 05/05/2023  8:23 AM EDT ----- Her was constipated by 1 week when I saw him yesterday, not taking his laxatives Can you call again today and ask if he has BM yet>?  Called pt stated he did have a BM with good relief.  This RN asked if he had any questions or concerns regarding medications and how to use for best outcome. He declined further discussion.  He did state appreciation of call.

## 2023-05-06 ENCOUNTER — Telehealth: Payer: Self-pay

## 2023-05-06 ENCOUNTER — Other Ambulatory Visit (HOSPITAL_BASED_OUTPATIENT_CLINIC_OR_DEPARTMENT_OTHER): Payer: Self-pay

## 2023-05-06 ENCOUNTER — Other Ambulatory Visit: Payer: Self-pay | Admitting: Hematology and Oncology

## 2023-05-06 MED ORDER — OXYCODONE HCL 15 MG PO TABS
15.0000 mg | ORAL_TABLET | ORAL | 0 refills | Status: DC | PRN
  Filled 2023-05-06: qty 90, 15d supply, fill #0

## 2023-05-06 MED FILL — Fosaprepitant Dimeglumine For IV Infusion 150 MG (Base Eq): INTRAVENOUS | Qty: 5 | Status: AC

## 2023-05-06 MED FILL — Dexamethasone Sodium Phosphate Inj 100 MG/10ML: INTRAMUSCULAR | Qty: 1 | Status: AC

## 2023-05-06 NOTE — Telephone Encounter (Signed)
 He called and left a message requesting Oxycodone refill to [facility] please.

## 2023-05-06 NOTE — Telephone Encounter (Signed)
done

## 2023-05-06 NOTE — Telephone Encounter (Signed)
Called and told Rx sent. He verbalized understanding.

## 2023-05-07 ENCOUNTER — Other Ambulatory Visit: Payer: Self-pay

## 2023-05-07 ENCOUNTER — Other Ambulatory Visit (HOSPITAL_COMMUNITY): Payer: Self-pay

## 2023-05-07 ENCOUNTER — Inpatient Hospital Stay: Payer: Medicaid Other | Attending: Hematology and Oncology

## 2023-05-07 VITALS — BP 148/92 | HR 74 | Temp 97.9°F | Resp 16

## 2023-05-07 DIAGNOSIS — C760 Malignant neoplasm of head, face and neck: Secondary | ICD-10-CM

## 2023-05-07 DIAGNOSIS — C119 Malignant neoplasm of nasopharynx, unspecified: Secondary | ICD-10-CM | POA: Diagnosis present

## 2023-05-07 DIAGNOSIS — T451X5A Adverse effect of antineoplastic and immunosuppressive drugs, initial encounter: Secondary | ICD-10-CM | POA: Insufficient documentation

## 2023-05-07 DIAGNOSIS — R110 Nausea: Secondary | ICD-10-CM | POA: Diagnosis not present

## 2023-05-07 DIAGNOSIS — C7989 Secondary malignant neoplasm of other specified sites: Secondary | ICD-10-CM | POA: Insufficient documentation

## 2023-05-07 DIAGNOSIS — Z5112 Encounter for antineoplastic immunotherapy: Secondary | ICD-10-CM | POA: Diagnosis present

## 2023-05-07 DIAGNOSIS — Z7189 Other specified counseling: Secondary | ICD-10-CM

## 2023-05-07 DIAGNOSIS — K1231 Oral mucositis (ulcerative) due to antineoplastic therapy: Secondary | ICD-10-CM | POA: Insufficient documentation

## 2023-05-07 DIAGNOSIS — Z79899 Other long term (current) drug therapy: Secondary | ICD-10-CM | POA: Insufficient documentation

## 2023-05-07 MED ORDER — PROCHLORPERAZINE MALEATE 10 MG PO TABS
10.0000 mg | ORAL_TABLET | Freq: Four times a day (QID) | ORAL | 2 refills | Status: DC | PRN
  Filled 2023-05-07 – 2023-05-13 (×2): qty 30, 8d supply, fill #0

## 2023-05-07 MED ORDER — SODIUM CHLORIDE 0.9 % IV SOLN
1000.0000 mg/m2/d | INTRAVENOUS | Status: DC
  Administered 2023-05-07: 8500 mg via INTRAVENOUS
  Filled 2023-05-07: qty 170

## 2023-05-07 MED ORDER — SODIUM CHLORIDE 0.9 % IV SOLN
10.0000 mg | Freq: Once | INTRAVENOUS | Status: AC
  Administered 2023-05-07: 10 mg via INTRAVENOUS
  Filled 2023-05-07: qty 1
  Filled 2023-05-07: qty 10

## 2023-05-07 MED ORDER — PALONOSETRON HCL INJECTION 0.25 MG/5ML
0.2500 mg | Freq: Once | INTRAVENOUS | Status: AC
  Administered 2023-05-07: 0.25 mg via INTRAVENOUS
  Filled 2023-05-07: qty 5

## 2023-05-07 MED ORDER — FAMOTIDINE IN NACL 20-0.9 MG/50ML-% IV SOLN
20.0000 mg | Freq: Once | INTRAVENOUS | Status: AC
  Administered 2023-05-07: 20 mg via INTRAVENOUS
  Filled 2023-05-07: qty 50

## 2023-05-07 MED ORDER — EMPTY CONTAINERS FLEXIBLE MISC
500.0000 mg/m2 | Freq: Once | Status: AC
  Administered 2023-05-07: 1000 mg via INTRAVENOUS
  Filled 2023-05-07: qty 500

## 2023-05-07 MED ORDER — SODIUM CHLORIDE 0.9% FLUSH: 10.0000 mL | INTRAVENOUS | Status: DC | PRN

## 2023-05-07 MED ORDER — CETUXIMAB CHEMO IV INJECTION 200 MG/100ML
500.0000 mg/m2 | Freq: Once | INTRAVENOUS | Status: DC
  Filled 2023-05-07: qty 500

## 2023-05-07 MED ORDER — DIPHENHYDRAMINE HCL 50 MG/ML IJ SOLN
50.0000 mg | Freq: Once | INTRAMUSCULAR | Status: AC
  Administered 2023-05-07: 50 mg via INTRAVENOUS
  Filled 2023-05-07: qty 1

## 2023-05-07 MED ORDER — SODIUM CHLORIDE 0.9 % IV SOLN: Freq: Once | INTRAVENOUS | Status: AC

## 2023-05-07 MED ORDER — SODIUM CHLORIDE 0.9 % IV SOLN
750.0000 mg | Freq: Once | INTRAVENOUS | Status: AC
  Administered 2023-05-07: 750 mg via INTRAVENOUS
  Filled 2023-05-07: qty 75

## 2023-05-07 MED ORDER — SODIUM CHLORIDE 0.9 % IV SOLN
150.0000 mg | Freq: Once | INTRAVENOUS | Status: AC
  Administered 2023-05-07: 150 mg via INTRAVENOUS
  Filled 2023-05-07: qty 5
  Filled 2023-05-07: qty 150

## 2023-05-07 NOTE — Patient Instructions (Signed)
[facility]  Discharge Instructions: Thank you for choosing [facility] to provide your oncology and hematology care.   If you have a lab appointment with the Cancer Center, please go directly to the Cancer Center and check in at the registration area.   Wear comfortable clothing and clothing appropriate for easy access to any Portacath or PICC line.   We strive to give you quality time with your provider. You may need to reschedule your appointment if you arrive late (15 or more minutes).  Arriving late affects you and other patients whose appointments are after yours.  Also, if you miss three or more appointments without notifying the office, you may be dismissed from the clinic at the provider's discretion.      For prescription refill requests, have your pharmacy contact our office and allow 72 hours for refills to be completed.    Today you received the following chemotherapy and/or immunotherapy agents: Erbitux, Carboplatin, 5-FU      To help prevent nausea and vomiting after your treatment, we encourage you to take your nausea medication as directed.  BELOW ARE SYMPTOMS THAT SHOULD BE REPORTED IMMEDIATELY: *FEVER GREATER THAN 100.4 F (38 C) OR HIGHER *CHILLS OR SWEATING *NAUSEA AND VOMITING THAT IS NOT CONTROLLED WITH YOUR NAUSEA MEDICATION *UNUSUAL SHORTNESS OF BREATH *UNUSUAL BRUISING OR BLEEDING *URINARY PROBLEMS (pain or burning when urinating, or frequent urination) *BOWEL PROBLEMS (unusual diarrhea, constipation, pain near the anus) TENDERNESS IN MOUTH AND THROAT WITH OR WITHOUT PRESENCE OF ULCERS (sore throat, sores in mouth, or a toothache) UNUSUAL RASH, SWELLING OR PAIN  UNUSUAL VAGINAL DISCHARGE OR ITCHING   Items with * indicate a potential emergency and should be followed up as soon as possible or go to the Emergency Department if any problems should occur.  Please show the CHEMOTHERAPY ALERT CARD or IMMUNOTHERAPY  ALERT CARD at check-in to the Emergency Department and triage nurse.  Should you have questions after your visit or need to cancel or reschedule your appointment, please contact [facility]  Dept: [phone]  and follow the prompts.  Office hours are 8:00 a.m. to 4:30 p.m. Monday - Friday. Please note that voicemails left after 4:00 p.m. may not be returned until the following business day.  We are closed weekends and major holidays. You have access to a nurse at all times for urgent questions. Please call the main number to the clinic Dept: [phone] and follow the prompts.   For any non-urgent questions, you may also contact your provider using MyChart. We now offer e-Visits for anyone 80 and older to request care online for non-urgent symptoms. For details visit [email or web address].   Also download the MyChart app! Go to the app store, search "MyChart", open the app, select [facility], and log in with your MyChart username and password.  Cetuximab Injection What is this medication? CETUXIMAB (se TUX i mab) treats head and neck cancer. It may also be used to treat colorectal cancer. It works by blocking a protein that causes cancer cells to grow and multiply. This helps to slow or stop the spread of cancer cells. It is a monoclonal antibody. This medicine may be used for other purposes; ask your health care provider or pharmacist if you have questions. COMMON BRAND NAME(S): Erbitux What should I tell my care team before I take this medication? They need to know if you have any of these conditions: Heart disease History  of tick bites Low levels of calcium, magnesium, or potassium in the blood Lung disease Red meat allergy An unusual or allergic reaction to cetuximab, other medications, foods, dyes, or preservatives Pregnant or trying to get pregnant Breast-feeding How should I use this medication? This medication is infused into a vein. It is  given by your care team in a hospital or clinic setting. Talk to your care team about the use of this medication in children. Special care may be needed. Overdosage: If you think you have taken too much of this medicine contact a poison control center or emergency room at once. NOTE: This medicine is only for you. Do not share this medicine with others. What if I miss a dose? Keep appointments for follow-up doses. It is important not to miss your dose. Call your care team if you are unable to keep an appointment. What may interact with this medication? Interactions are not expected. This list may not describe all possible interactions. Give your health care provider a list of all the medicines, herbs, non-prescription drugs, or dietary supplements you use. Also tell them if you smoke, drink alcohol, or use illegal drugs. Some items may interact with your medicine. What should I watch for while using this medication? Visit your care team for regular checks on your progress. This medication may make you feel generally unwell. This is not uncommon, as chemotherapy can affect healthy cells as well as cancer cells. Report any side effects. Continue your course of treatment even though you feel ill unless your care team tells you to stop. You may need blood work done while you are taking this medication. This medication can make you more sensitive to the sun. Keep out of the sun while taking this medication and for 2 months after the last dose. If you cannot avoid being in the sun, wear protective clothing and sunscreen. Do not use sun lamps, tanning beds, or tanning booths. This medication can cause serious infusion reactions. To reduce the risk, your care team may give you other medications to take before receiving this one. Be sure to follow the directions of your care team. This medication may cause serious skin reactions. They can happen weeks to months after starting the medication. Contact your care  team right away if you notice fevers or flu-like symptoms with a rash. The rash may be red or purple and then turn into blisters or peeling of the skin. You may also notice a red rash with swelling of the face, lips, or lymph nodes in your neck or under your arms. Talk to your care team if you may be pregnant. Serious birth defects can occur if you take this medication during pregnancy and for 2 months after the last dose. You will need a negative pregnancy test before starting this medication. Contraception is recommended while taking this medication and for 2 months after the last dose. Your care team can help you find the option that works for you. Do not breastfeed while taking this medication and for 2 months after the last dose. This medication may cause infertility. Talk to your care team if you are concerned about your fertility. What side effects may I notice from receiving this medication? Side effects that you should report to your care team as soon as possible: Allergic reactions--skin rash, itching, hives, swelling of the face, lips, tongue, or throat Dry cough, shortness of breath or trouble breathing Heart attack--pain or tightness in the chest, shoulders, arms, or jaw, nausea, shortness  of breath, cold or clammy skin, feeling faint or lightheaded Infusion reactions--chest pain, shortness of breath or trouble breathing, feeling faint or lightheaded Low calcium level--muscle pain or cramps, confusion, tingling, or numbness in the hands or feet Low magnesium level--muscle pain or cramps, unusual weakness or fatigue, fast or irregular heartbeat, tremors Low potassium level--muscle pain or cramps, unusual weakness or fatigue, fast or irregular heartbeat, constipation Redness, blistering, peeling, or loosening of the skin, including inside the mouth Side effects that usually do not require medical attention (report to your care team if they continue or are  bothersome): Diarrhea Headache Joint pain Nausea Unusual weakness or fatigue Weight loss This list may not describe all possible side effects. Call your doctor for medical advice about side effects. You may report side effects to FDA at 1-800-FDA-1088. Where should I keep my medication? This medication is given in a hospital or clinic. It will not be stored at home. NOTE: This sheet is a summary. It may not cover all possible information. If you have questions about this medicine, talk to your doctor, pharmacist, or health care provider.  2024 [employer]vier/Gold Standard (2022-02-06 00:00:00)

## 2023-05-07 NOTE — Progress Notes (Signed)
OK to inf Carbo over 30 min.  Ebony Hail, Pharm.D., CPP 05/07/2023@3 :34 PM

## 2023-05-11 ENCOUNTER — Inpatient Hospital Stay: Payer: Medicaid Other

## 2023-05-11 VITALS — BP 118/70 | HR 99 | Temp 99.3°F | Resp 15

## 2023-05-11 DIAGNOSIS — Z7189 Other specified counseling: Secondary | ICD-10-CM

## 2023-05-11 DIAGNOSIS — C760 Malignant neoplasm of head, face and neck: Secondary | ICD-10-CM

## 2023-05-11 MED ORDER — SODIUM CHLORIDE 0.9% FLUSH: 10.0000 mL | INTRAVENOUS | Status: DC | PRN

## 2023-05-13 ENCOUNTER — Other Ambulatory Visit (HOSPITAL_COMMUNITY): Payer: Self-pay

## 2023-05-13 ENCOUNTER — Other Ambulatory Visit (HOSPITAL_BASED_OUTPATIENT_CLINIC_OR_DEPARTMENT_OTHER): Payer: Self-pay

## 2023-05-14 ENCOUNTER — Other Ambulatory Visit: Payer: Self-pay | Admitting: Hematology and Oncology

## 2023-05-14 ENCOUNTER — Other Ambulatory Visit: Payer: Self-pay

## 2023-05-14 ENCOUNTER — Telehealth: Payer: Self-pay | Admitting: *Deleted

## 2023-05-14 ENCOUNTER — Ambulatory Visit: Payer: Medicaid Other | Admitting: Hematology and Oncology

## 2023-05-14 ENCOUNTER — Other Ambulatory Visit: Payer: Medicaid Other

## 2023-05-14 ENCOUNTER — Other Ambulatory Visit (HOSPITAL_BASED_OUTPATIENT_CLINIC_OR_DEPARTMENT_OTHER): Payer: Self-pay

## 2023-05-14 ENCOUNTER — Ambulatory Visit: Payer: Medicaid Other

## 2023-05-14 MED ORDER — LORAZEPAM 0.5 MG PO TABS
0.5000 mg | ORAL_TABLET | Freq: Two times a day (BID) | ORAL | 0 refills | Status: DC | PRN
  Filled 2023-05-14: qty 60, 30d supply, fill #0

## 2023-05-14 NOTE — Telephone Encounter (Signed)
done

## 2023-05-14 NOTE — Telephone Encounter (Signed)
Patient called and left VM:  Requested refill of Lorazepam 0.5 mg. Please send to [facility] pharmacy

## 2023-05-16 ENCOUNTER — Other Ambulatory Visit: Payer: Self-pay

## 2023-05-17 ENCOUNTER — Other Ambulatory Visit: Payer: Self-pay | Admitting: Hematology and Oncology

## 2023-05-17 ENCOUNTER — Telehealth: Payer: Self-pay

## 2023-05-17 ENCOUNTER — Other Ambulatory Visit (HOSPITAL_BASED_OUTPATIENT_CLINIC_OR_DEPARTMENT_OTHER): Payer: Self-pay

## 2023-05-17 MED ORDER — METRONIDAZOLE 1 % EX GEL
1.0000 | Freq: Two times a day (BID) | CUTANEOUS | 1 refills | Status: DC | PRN
  Filled 2023-05-17: qty 60, 30d supply, fill #0

## 2023-05-17 NOTE — Telephone Encounter (Signed)
[facility]

## 2023-05-17 NOTE — Telephone Encounter (Signed)
Returned his call. He has a rash to his face that started 4-5 days ago. It is itchy and dry. He is asking for recommendations? Lotions, etc?  His friends have invited him to go to the beach tomorrow for 3 days and he would like to go. He will wear a hat and use sunscreen. Any other recommendations?

## 2023-05-17 NOTE — Telephone Encounter (Signed)
Called and given below message. He verbalized understanding. He is leaving tomorrow and will be back on Thursday. He is keeping appt for treatment. He ask that the metrogel be sent to [facility] please.

## 2023-05-17 NOTE — Telephone Encounter (Signed)
We can send prescription for metrogel Heat and sun will make it worse If gone to beach for 3 days, is he still coming in for treatment this week?

## 2023-05-18 ENCOUNTER — Other Ambulatory Visit: Payer: Self-pay

## 2023-05-19 ENCOUNTER — Other Ambulatory Visit: Payer: Self-pay

## 2023-05-19 DIAGNOSIS — C760 Malignant neoplasm of head, face and neck: Secondary | ICD-10-CM

## 2023-05-21 ENCOUNTER — Other Ambulatory Visit: Payer: Self-pay

## 2023-05-21 ENCOUNTER — Encounter: Payer: Self-pay | Admitting: Hematology and Oncology

## 2023-05-21 ENCOUNTER — Inpatient Hospital Stay: Payer: Medicaid Other

## 2023-05-21 ENCOUNTER — Ambulatory Visit: Payer: Medicaid Other

## 2023-05-21 ENCOUNTER — Other Ambulatory Visit: Payer: Medicaid Other

## 2023-05-21 ENCOUNTER — Inpatient Hospital Stay: Payer: Medicaid Other | Admitting: Hematology and Oncology

## 2023-05-21 ENCOUNTER — Other Ambulatory Visit (HOSPITAL_COMMUNITY): Payer: Self-pay

## 2023-05-21 VITALS — BP 134/84 | HR 114 | Temp 100.1°F | Resp 18 | Ht 69.0 in | Wt 188.4 lb

## 2023-05-21 VITALS — HR 98

## 2023-05-21 DIAGNOSIS — Z95828 Presence of other vascular implants and grafts: Secondary | ICD-10-CM

## 2023-05-21 DIAGNOSIS — Z5112 Encounter for antineoplastic immunotherapy: Secondary | ICD-10-CM | POA: Diagnosis not present

## 2023-05-21 DIAGNOSIS — C760 Malignant neoplasm of head, face and neck: Secondary | ICD-10-CM

## 2023-05-21 DIAGNOSIS — Z7189 Other specified counseling: Secondary | ICD-10-CM

## 2023-05-21 DIAGNOSIS — R197 Diarrhea, unspecified: Secondary | ICD-10-CM

## 2023-05-21 DIAGNOSIS — L270 Generalized skin eruption due to drugs and medicaments taken internally: Secondary | ICD-10-CM

## 2023-05-21 DIAGNOSIS — K1231 Oral mucositis (ulcerative) due to antineoplastic therapy: Secondary | ICD-10-CM | POA: Diagnosis not present

## 2023-05-21 DIAGNOSIS — C7800 Secondary malignant neoplasm of unspecified lung: Secondary | ICD-10-CM

## 2023-05-21 DIAGNOSIS — G893 Neoplasm related pain (acute) (chronic): Secondary | ICD-10-CM

## 2023-05-21 LAB — CBC WITH DIFFERENTIAL (CANCER CENTER ONLY)
Abs Immature Granulocytes: 0.01 10*3/uL (ref 0.00–0.07)
Basophils Absolute: 0 10*3/uL (ref 0.0–0.1)
Basophils Relative: 1 %
Eosinophils Absolute: 0.2 10*3/uL (ref 0.0–0.5)
Eosinophils Relative: 4 %
HCT: 33.2 % — ABNORMAL LOW (ref 39.0–52.0)
Hemoglobin: 11 g/dL — ABNORMAL LOW (ref 13.0–17.0)
Immature Granulocytes: 0 %
Lymphocytes Relative: 31 %
Lymphs Abs: 1.3 10*3/uL (ref 0.7–4.0)
MCH: 29.2 pg (ref 26.0–34.0)
MCHC: 33.1 g/dL (ref 30.0–36.0)
MCV: 88.1 fL (ref 80.0–100.0)
Monocytes Absolute: 0.5 10*3/uL (ref 0.1–1.0)
Monocytes Relative: 11 %
Neutro Abs: 2.3 10*3/uL (ref 1.7–7.7)
Neutrophils Relative %: 53 %
Platelet Count: 143 10*3/uL — ABNORMAL LOW (ref 150–400)
RBC: 3.77 MIL/uL — ABNORMAL LOW (ref 4.22–5.81)
RDW: 15 % (ref 11.5–15.5)
WBC Count: 4.3 10*3/uL (ref 4.0–10.5)
nRBC: 0 % (ref 0.0–0.2)

## 2023-05-21 LAB — BASIC METABOLIC PANEL - CANCER CENTER ONLY
Anion gap: 7 (ref 5–15)
BUN: 12 mg/dL (ref 6–20)
CO2: 31 mmol/L (ref 22–32)
Calcium: 8.6 mg/dL — ABNORMAL LOW (ref 8.9–10.3)
Chloride: 100 mmol/L (ref 98–111)
Creatinine: 0.73 mg/dL (ref 0.61–1.24)
GFR, Estimated: >60 mL/min (ref >60)
Glucose, Bld: 200 mg/dL — ABNORMAL HIGH (ref 70–99)
Potassium: 3.1 mmol/L — ABNORMAL LOW (ref 3.5–5.1)
Sodium: 138 mmol/L (ref 135–145)

## 2023-05-21 LAB — MAGNESIUM: Magnesium: 1.5 mg/dL — ABNORMAL LOW (ref 1.7–2.4)

## 2023-05-21 MED ORDER — MAGNESIUM SULFATE 2 GM/50ML IV SOLN
2.0000 g | Freq: Once | INTRAVENOUS | Status: AC
  Administered 2023-05-21: 2 g via INTRAVENOUS
  Filled 2023-05-21: qty 50

## 2023-05-21 MED ORDER — EMPTY CONTAINERS FLEXIBLE MISC
400.0000 mg/m2 | Freq: Once | Status: AC
  Administered 2023-05-21: 800 mg via INTRAVENOUS
  Filled 2023-05-21: qty 400

## 2023-05-21 MED ORDER — NYSTATIN 100000 UNIT/ML MT SUSP
5.0000 mL | Freq: Four times a day (QID) | OROMUCOSAL | 0 refills | Status: DC
  Filled 2023-05-21: qty 240, 12d supply, fill #0

## 2023-05-21 MED ORDER — SODIUM CHLORIDE 0.9 % IV SOLN: Freq: Once | INTRAVENOUS | Status: AC

## 2023-05-21 MED ORDER — SODIUM CHLORIDE 0.9% FLUSH
10.0000 mL | Freq: Once | INTRAVENOUS | Status: AC | PRN
  Administered 2023-05-21: 10 mL

## 2023-05-21 MED ORDER — DIPHENHYDRAMINE HCL 50 MG/ML IJ SOLN
50.0000 mg | Freq: Once | INTRAMUSCULAR | Status: AC
  Administered 2023-05-21: 50 mg via INTRAVENOUS
  Filled 2023-05-21: qty 1

## 2023-05-21 MED ORDER — SODIUM CHLORIDE 0.9% FLUSH: 10.0000 mL | INTRAVENOUS | Status: DC | PRN

## 2023-05-21 MED ORDER — MAGIC MOUTHWASH W/LIDOCAINE: 5.0000 mL | Freq: Four times a day (QID) | ORAL | 0 refills | Status: DC

## 2023-05-21 MED ORDER — MINOCYCLINE HCL 100 MG PO CAPS
100.0000 mg | ORAL_CAPSULE | Freq: Two times a day (BID) | ORAL | 1 refills | Status: DC
  Filled 2023-05-21: qty 60, 30d supply, fill #0

## 2023-05-21 MED ORDER — FAMOTIDINE IN NACL 20-0.9 MG/50ML-% IV SOLN
20.0000 mg | Freq: Once | INTRAVENOUS | Status: AC
  Administered 2023-05-21: 20 mg via INTRAVENOUS
  Filled 2023-05-21: qty 50

## 2023-05-21 NOTE — Assessment & Plan Note (Signed)
His pain control has improved dramatically with positive response to therapy He has pain medicine to take as needed

## 2023-05-21 NOTE — Assessment & Plan Note (Signed)
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

## 2023-05-21 NOTE — Progress Notes (Signed)
[facility] OFFICE PROGRESS NOTE  Patient Care Team: Artis Delay, MD as PCP - General (Hematology and Oncology) Artis Delay, MD as Consulting Physician (Hematology and Oncology) Elfredia Nevins, MD as Referring Physician (Plastic Surgery) Hilarie Fredrickson, MD as Consulting Physician (Gastroenterology)  ASSESSMENT & PLAN:  Malignant neoplasm of head and neck [facility]) He has experienced significant side effects with skin toxicity from cetuximab with associated diarrhea and mucositis However, he also show excellent improvement with disease control clinically The patient wants to continue treatment I recommend additional supportive care and dose reduction of cetuximab today  Mucositis due to antineoplastic therapy This is due to side effects of chemotherapy I will prescribe Magic mouthwash with lidocaine swish and spit  Drug-induced skin rash This is classic skin toxicity from cetuximab I recommend dose reduction Plan to give him a course of minocycline The patient is recommended to avoid sun exposure  Cancer associated pain His pain control has improved dramatically with positive response to therapy He has pain medicine to take as needed  Hypomagnesemia We will give him IV magnesium and he is prescribed oral magnesium supplement  Diarrhea Likely due to side effects of treatment I recommend the patient to stop laxative temporarily  No orders of the defined types were placed in this encounter.   All questions were answered. The patient knows to call the clinic with any problems, questions or concerns. The total time spent in the appointment was 40 minutes encounter with patients including review of chart and various tests results, discussions about plan of care and coordination of care plan   Artis Delay, MD 05/21/2023 10:38 AM  INTERVAL HISTORY: Please see below for problem oriented charting. he returns for treatment follow-up He has experienced multiple side  effects including mucositis, diarrhea and skin toxicity His pain control has improved and he is not in much pain anymore from his cancer We discussed risk and benefits of dose reduction versus taking treatment holiday  REVIEW OF SYSTEMS:   Constitutional: Denies fevers, chills or abnormal weight loss Eyes: Denies blurriness of vision Respiratory: Denies cough, dyspnea or wheezes Cardiovascular: Denies palpitation, chest discomfort or lower extremity swelling Lymphatics: Denies new lymphadenopathy or easy bruising Neurological:Denies numbness, tingling or new weaknesses Behavioral/Psych: Mood is stable, no new changes  All other systems were reviewed with the patient and are negative.  I have reviewed the past medical history, past surgical history, social history and family history with the patient and they are unchanged from previous note.  ALLERGIES:  is allergic to phenergan [promethazine hcl], heparin, and clindamycin.  MEDICATIONS:  Current Outpatient Medications  Medication Sig Dispense Refill   minocycline (MINOCIN) 100 MG capsule Take 1 capsule (100 mg total) by mouth 2 (two) times daily. 60 capsule 1   blood glucose meter kit and supplies KIT Dispense based on patient and insurance preference. Use up to four times daily as directed. (FOR ICD-9 250.00, 250.01). 1 each 1   cetirizine (ZYRTEC) 10 MG tablet Take 1 tablet (10 mg total) by mouth daily as needed for allergies. 100 tablet 0   dexamethasone (DECADRON) 4 MG tablet Take 1 tablet (4 mg total) by mouth daily for 3 days after chemo, every 2 weeks. 30 tablet 0   famotidine (PEPCID) 40 MG tablet Take 1 tablet (40 mg total) by mouth at bedtime. 30 tablet 3   levothyroxine (SYNTHROID) 175 MCG tablet Take 1 tablet (175 mcg total) by mouth daily before breakfast. 30 tablet 3   LORazepam (  ATIVAN) 0.5 MG tablet Take 1 tablet (0.5 mg total) by mouth 2 (two) times daily as needed for anxiety. 60 tablet 0   magic mouthwash w/lidocaine  SOLN Take 5 mLs by mouth 4 (four) times daily. 5 mls 4 x a day for 10 days, swish and spit. 240 mL 0   metFORMIN (GLUCOPHAGE) 500 MG tablet Take 1 tablet (500 mg total) by mouth 2 (two) times daily with a meal. 60 tablet 2   methadone (DOLOPHINE) 10 MG tablet Take 1 tablet (10 mg) by mouth every 8 hours. 90 tablet 0   metroNIDAZOLE (METROGEL) 1 % gel Apply topically 2 (two) times daily as needed. 60 g 1   ondansetron (ZOFRAN) 8 MG tablet Take 1 tablet (8 mg total) by mouth every 8 (eight) hours as needed for nausea or vomiting. 90 tablet 1   oxyCODONE (ROXICODONE) 15 MG immediate release tablet Take 1 tablet (15 mg total) by mouth every 4 (four) hours as needed. 90 tablet 0   pantoprazole (PROTONIX) 40 MG tablet Take 1 tablet (40 mg total) by mouth daily. 30 tablet 2   polyethylene glycol (MIRALAX / GLYCOLAX) 17 g packet Take 17 g by mouth 2 (two) times daily.     prochlorperazine (COMPAZINE) 10 MG tablet Take 1 tablet (10 mg total) by mouth every 6 (six) hours as needed for nausea or vomiting. 30 tablet 2   senna-docusate (SENOKOT-S) 8.6-50 MG tablet Take 2 tablets by mouth 2 (two) times daily. 100 tablet 3   No current facility-administered medications for this visit.   Facility-Administered Medications Ordered in Other Visits  Medication Dose Route Frequency Provider Last Rate Last Admin   anticoagulant sodium citrate solution 5 mL  5 mL Intracatheter Once Bertis Ruddy, Leliana Kontz, MD       anticoagulant sodium citrate solution 5 mL  5 mL Intracatheter Once Bertis Ruddy, Tristan Bramble, MD       magnesium sulfate IVPB 2 g 50 mL  2 g Intravenous Once Bertis Ruddy, Kobee Medlen, MD 50 mL/hr at 05/21/23 1017 2 g at 05/21/23 1017   sodium chloride flush (NS) 0.9 % injection 10 mL  10 mL Intracatheter Once Bertis Ruddy, Brizza Nathanson, MD       sodium chloride flush (NS) 0.9 % injection 10 mL  10 mL Intracatheter PRN Artis Delay, MD        SUMMARY OF ONCOLOGIC HISTORY: Oncology History Overview Note  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 Pan-endoscopy elsewhere showed cancer from nasopharyngeal space.   01/04/2007 - 02/20/2007 Chemotherapy   He received 2 cycles of cisplatin and 5FU followed by concurrent chemo with weekly cisplatin and radiation. He only received 2 doses of chemo due to severe mucositis, nausea and weight loss.   04/05/2007 - 08/04/2007 Chemotherapy   He received 4 more courses of cisplatin with 5FU and had complete response   07/05/2009 Procedure   Fine-needle aspirate of the right level II lymph nodes come from recurrent metastatic disease. Repeat endoscopy and CT  scan show no evidence of disease elsewhere.   07/08/2009 - 12/02/2009 Chemotherapy   He was given 6 cycles of carboplatin, 5-FU and docetaxel   12/03/2009 Surgery   He has surgery to the residual lymph node on the right neck which showed no evidence of disease.   02/22/2012 Imaging   Repeat imaging study showed large recurrent mass. He was referred elsewhere for further treatment.   05/03/2012 Surgery   He underwent left upper lobectomy.   04/29/2013 Imaging   PEt scan showed lesion on right level II B and lower lung was abnormal   06/03/2013 - 02/02/2014 Chemotherapy   He had 6 cycles of chemotherapy when he was found to have recurrence of cancer and had received oxaliplatin and capecitabine   06/07/2014 Imaging   PET CT scan showed persistent disease in the right neck lymph nodes and left lung   06/29/2014 Procedure   Accession: [id number] repeat LUL  biopsy confirmed metastatic cancer   07/18/2014 - 07/31/2014 Radiation Therapy   He received palliative radiation therapy to the lungs   10/10/2014 Imaging   CT scan of the chest, abdomen and pelvis show regression in the size of the lung nodule in the left upper lobe and stable pulmonary nodules   01/24/2015 Imaging   CT scan showed stable disease in neck and lung   06/19/2015 Imaging   CT scan of the neck and the chest show possible mild progression of the nodule in the right side of the neck.   06/25/2015 Imaging   PET scan confirmed disease recurrence in the neck   07/07/2015 Imaging   He had MRI neck at [facility]   09/03/2015 - 08/26/2018 Chemotherapy   He received palliative chemo with Nivolumab   10/29/2015 Imaging   PET CT showed positive response to Rx   02/28/2016 Imaging   Ct abdomen showed abnormal thinkening in his stomach   03/03/2016 Imaging   CT: Right sternocleidomastoid muscle metastasis appears less distinct but otherwise not significantly changed in size or configuration since 06/19/2015.2. Left level 3 lymph node which was hypermetabolic by PET-CT in January 2017 appears slightly smaller   04/01/2016 Imaging   CT cervical spine showed no acute fracture or traumatic malalignment in the cervical spine   04/22/2016 Procedure   Port-a-cath placed.   06/16/2016 Imaging   Ct neck showed right sternocleidomastoid muscle metastasis is further decreased in conspicuity since May, and has mildly decreased in size since September 2016. Continued stability of sub-centimeter left cervical lymph nodes. No new or progressive metastatic disease in the neck.   06/16/2016 Imaging   CT chest showed stable masslike radiation fibrosis in the left upper lobe. Stable subcentimeter pulmonary nodules in the bilateral lower lobes. No new or progressive metastatic disease in the chest. Nonobstructing left renal stone.   10/13/2016 Imaging   Ct neck showed unchanged right sternocleidomastoid muscle  metastasis. Unchanged subcentimeter left cervical lymph nodes. No evidence of new or progressive metastatic disease in the neck.   10/13/2016 Imaging   CT chest showed tiny hypervascular foci in the liver, not definitely seen on prior imaging of 06/16/2016 and 02/28/2016. Abdomen MRI without and with contrast recommended to further evaluate as metastatic disease is a concern. 2. Stable appearance of post treatment changes left upper lung and scattered tiny bilateral pulmonary nodules.   02/11/2017 Imaging   Ct neck: Lymph node mass right posterior neck appears improved from the prior study. Small posterior lymph nodes on the left unchanged. Occluded right jugular vein  unchanged.   02/11/2017 Imaging   1. Similar appearance of postsurgical and radiation changes in the left upper lobe. 2. Similar bilateral pulmonary nodules. 3. No thoracic adenopathy. 4. Subtle foci of post-contrast enhancement within the liver are suboptimally characterized on this nondedicated study. Likely similar. These could either be re-evaluated at followup or more entirely characterized with abdominal MRI. 5. Left nephrolithiasis.   05/19/2017 Imaging   Matted lymph node mass right posterior neck appears larger in the recent CT. Accurate measurements difficult due to infiltrating tumor margins and infiltration of the muscle. Right jugular vein again appears occluded or resected. Small left posterior lymph nodes stable. Left upper lobe airspace density stable and similar to the prior CT   06/03/2017 PET scan   1. Hypermetabolic ill-defined right level IIb lymph node, about 1.3 cm in diameter with maximum SUV 9.5 (formerly 8.1). Appearance suspicious for residual/recurrent malignancy. No worrisome left-sided lesion. 2. Left suprahilar indistinct opacity demonstrates no worrisome hypermetabolic activity. The 5 mm left lower lobe pulmonary nodule is stable and not currently hypermetabolic although below sensitive PET-CT size  thresholds. 3. Other imaging findings of potential clinical significance: Bilateral nonobstructive nephrolithiasis. Chronic bilateral maxillary sinusitis.   05/11/2018 PET scan   1. Continued chronic accentuated metabolic activity in the vicinity of right level IIB and the adjacent right sternocleidomastoid muscle, with ill definition of surrounding tissue planes. Maximum SUV is currently 8.1, formerly 9.5. Accentuated metabolic activity is been present in this vicinity back through 06/25/2015, and there was also some low-level activity in this vicinity on 06/07/2014. Some of this may be from scarring and local muscular activity although clearly a component of residual tumor is difficult to exclude given the focally high activity. 2. Other imaging findings of potential clinical significance: Chronic bilateral maxillary sinusitis. Chronic scarring in the left upper lobe. Chronically stable 5 mm left lower lobe nodule is considered benign. Nonobstructive left nephrolithiasis.   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 COMMENT:No significant keratinization is identified. Other basaloid carcinomas are in the differential diagnosis. No cell block material is available for further testing.   09/12/2018 Procedure   He underwent fine Needle Aspiration   10/04/2018 PET scan   1. Significant progression of local recurrence laterally in the mid right neck with an enlarging, increasingly hypermetabolic soft tissue mass. This involves the right sternocleidomastoid muscle. 2. Small lymph nodes in the right axilla are increasingly hypermetabolic. These are nonspecific and potentially reactive, although could reflect a small metastases. Small hypermetabolic nodule in the left suprasternal notch is unchanged. 3. No other evidence of metastatic disease.     10/07/2018 -  12/23/2018 Chemotherapy   The patient had cisplatin plus gemzar   12/07/2018 Imaging   1. Decreased size of lateral right neck mass. 2. Unchanged soft tissue nodule in the suprasternal notch. 3. No evidence of new metastatic disease in the neck.     05/30/2019 Imaging   CT neck No clear change or progression compared to the study of March. Overall measurements of the right lateral neck mass are similar, approximately 3 x 1.8 cm. See above discussion. One could argue that there is slight increase in lateral bulging, possibly with an increase in contrast enhancement, towards the inferior margin. This is of questionable validity but could possibly represent some progression or inflammatory change. Other findings in the region are stable.  07/20/2019 - 09/15/2019 Chemotherapy   The patient had gemzar and cisplatin   10/02/2019 Imaging   CT neck As compared to 05/30/2019, no significant interval change in size of an ill-defined mass within the right lateral neck, again measuring 3.3 x 1.8 cm in transaxial dimensions.   Unchanged mildly enlarged left level I lymph node measuring 1.1 cm in short axis.   Unchanged node or nodule at the thoracic inlet, measuring 1.3 x 0.8 cm.   Please refer to concurrently performed chest CT for a description of findings below the level of the thoracic inlet.     10/02/2019 Imaging   CT chest 1. No new or progressive findings in the chest to suggest metastatic disease. 2. Bilateral subcentimeter solid pulmonary nodules are stable since 2018. 3. Hyperdense 1.1 cm anterior liver focus, not clearly visualized on prior studies. Suggest MRI abdomen without and with IV contrast for further characterization.   10/20/2019 - 12/29/2019 Chemotherapy   The patient had gemzar maintenance   06/12/2020 Imaging   1. Enlarging superficial, exophytic component of the chronic right sternocleidomastoid muscle mass. See series 6, image 55. 2. Elsewhere stable CT appearance of the  Neck.   06/12/2020 Imaging   Post treatment scarring in the left hemithorax, stable. No evidence recurrent or metastatic disease   06/14/2020 - 10/15/2020 Chemotherapy   He received carboplatin, 5FU and Rande Lawman       10/31/2020 Procedure   Interval improvement in right lateral lymph node mass. Improvement in dermal component as well as invasion of the right sternocleidomastoid muscle.   10 mm submental lymph node slightly enlarged compared to the prior study. Continued follow-up recommended.   11/01/2020 - 05/22/2022 Chemotherapy   Patient is on Treatment Plan : HEAD/NECK Pembrolizumab Q21D     07/03/2022 - 10/30/2022 Chemotherapy   Patient is on Treatment Plan : Head and neck Pembrolizumab (400) q42d     11/30/2022 Imaging   CT chest  1. Stable exam. No new or progressive findings to suggest recurrent or metastatic disease. 2. Stable tiny bilateral nodules since 06/12/2020, consistent with benign etiology. 3. Punctate nonobstructing left renal stone.   12/01/2022 Imaging   CT neck  Growing right neck mass since 2022 with epidural tumor extension via the right C2-3 foramen. Cervical MRI with contrast would be contributory.   12/11/2022 - 04/02/2023 Chemotherapy   Patient is on Treatment Plan : HEAD/NECK toripalimab-tpzi D1 + cisplatin D1 + gemcitabine D1,8 q21d x 6 cycles / toripalimab-tpzi q21d (up to 25m)     04/15/2023 Imaging   CT Chest W Contrast  Result Date: 04/15/2023 CLINICAL DATA:  Right neck mass, nasopharyngeal carcinoma EXAM: CT CHEST WITH CONTRAST TECHNIQUE: Multidetector CT imaging of the chest was performed during intravenous contrast administration. RADIATION DOSE REDUCTION: This exam was performed according to the departmental dose-optimization program which includes automated exposure control, adjustment of the mA and/or kV according to patient size and/or use of iterative reconstruction technique. CONTRAST:  75mL OMNIPAQUE IOHEXOL 300 MG/ML  SOLN COMPARISON:   11/27/2022 FINDINGS: Cardiovascular: Right Port-A-Cath tip: Right atrium. Mediastinum/Nodes: Small left supraclavicular lymph node 0.5 cm in short axis on image 4 series 3, formerly the same. No pathologic adenopathy in the chest. Lungs/Pleura: Biapical pleuroparenchymal scarring, right greater than left. Stable left upper lobe scarring and peribronchovascular density with wedge resection line in the left upper lobe, no significant contour change of density in this vicinity to suggest malignancy. Stable 5 by 4 mm left lower lobe nodule and stable 3 by  4 mm right lower lobe nodule, unchanged from at least 2021, compatible with benign etiology. No new lesion identified. Upper Abdomen: 2 mm left kidney upper pole nonobstructive renal calculus, image 43 series 7. Musculoskeletal: Unremarkable IMPRESSION: 1. Stable postoperative findings in the left upper lobe. 2. Stable small bilateral lower lobe pulmonary nodules, unchanged from at least 2021, compatible with benign etiology. 3. 2 mm left kidney upper pole nonobstructive renal calculus. Electronically Signed   By: Gaylyn Rong M.D.   On: 04/15/2023 14:03   CT Soft Tissue Neck W Contrast  Result Date: 04/15/2023 CLINICAL DATA:  Head/neck cancer.  Last chemo 04/02/2023. EXAM: CT NECK WITH CONTRAST TECHNIQUE: Multidetector CT imaging of the neck was performed using the standard protocol following the bolus administration of intravenous contrast. RADIATION DOSE REDUCTION: This exam was performed according to the departmental dose-optimization program which includes automated exposure control, adjustment of the mA and/or kV according to patient size and/or use of iterative reconstruction technique. CONTRAST:  75mL OMNIPAQUE IOHEXOL 300 MG/ML  SOLN COMPARISON:  CT neck 11/27/2022. FINDINGS: Pharynx and larynx: There is probable mucoid debris in the right nasal cavity/nasopharynx. The nasal cavity and nasopharynx are otherwise unremarkable. The oral cavity and  oropharynx are unremarkable. The parapharyngeal spaces are clear. The hypopharynx and larynx are unremarkable. The epiglottis is normal. There is no retropharyngeal collection. The airway is patent. Salivary glands: Atrophy of the parotid and left submandibular glands is unchanged. The right submandibular gland is again not identified and may be surgically absent. Thyroid: Unremarkable. Lymph nodes: The infiltrative mass in the right neck is again seen. The mass invades through the right C2-C3 neural foramen into the epidural space resulting in mild narrowing of the thecal sac without suspected cord compression. The degree of intraspinal epidural tumor appears slightly increased compared to the study from 11/27/2022. Mass again encases the vertebral artery at the C2-C3 level (4-36), unchanged. The remainder of the infiltrative mass in the paraspinal musculature with extension into the overlying skin surface does not appear significantly changed in size or extent compared to the prior study. A 0.9 cm midline level I lymph node is unchanged going back to 2021. Otherwise, there is no new or progressive lymphadenopathy in the neck. Vascular: As above, there is encasement of the right vertebral artery at the C2-C3 level by the right neck mass. There is mild calcified plaque at the carotid bifurcations. The right IJ is occluded or sacrificed, unchanged. The left IJ is patent. A right chest wall port is partially imaged. Limited intracranial: Unremarkable. Visualized orbits: Unremarkable. Mastoids and visualized paranasal sinuses: There is mild mucosal thickening in the maxillary sinuses. The mastoid air cells and middle ear cavities are clear. Skeleton: There is no acute osseous abnormality or suspicious osseous lesion. Upper chest: Assessed on the separately dictated CT chest. Other: None. IMPRESSION: 1. Infiltrative mass again seen in the right neck centered in the paraspinal musculature with invasion through the right  C2-C3 neural foramen. The bulk of the epidural tumor at this level appears slightly increased in size compared to the prior study from 11/27/2022 with probable leftward displacement of the cord but without frank cord compression. The remainder of the mass in the right neck is otherwise not significantly changed. 2. No new or progressive lymphadenopathy in the remainder of the neck. 3. Probable mucoid debris in the right nasal cavity/nasopharynx. No suspicious mass lesion or enhancement in the nasopharynx. Electronically Signed   By: Lesia Hausen M.D.   On: 04/15/2023 14:02  05/07/2023 -  Chemotherapy   Patient is on Treatment Plan : HEAD/NECK Cetuximab q14d + Carboplatin + 5FU IVCI D1-4 q21d x 6 cycles / Cetuximab q14d       PHYSICAL EXAMINATION: ECOG PERFORMANCE STATUS: 1 - Symptomatic but completely ambulatory  Vitals:   05/21/23 0917  BP: 134/84  Pulse: (!) 114  Resp: 18  Temp: 100.1 F (37.8 C)  SpO2: 100%   Filed Weights   05/21/23 0917  Weight: 188 lb 6.4 oz (85.5 kg)    GENERAL:alert, no distress and comfortable SKIN: He has classic acneform rash EYES: normal, Conjunctiva are pink and non-injected, sclera clear OROPHARYNX: Noted mild mucositis NECK: supple, thyroid normal size, non-tender, without nodularity LYMPH: The lesion on his neck is smaller  LUNGS: clear to auscultation and percussion with normal breathing effort HEART: regular rate & rhythm and no murmurs and no lower extremity edema ABDOMEN:abdomen soft, non-tender and normal bowel sounds Musculoskeletal:no cyanosis of digits and no clubbing  NEURO: alert & oriented x 3 with fluent speech, no focal motor/sensory deficits  LABORATORY DATA:  I have reviewed the data as listed    Component Value Date/Time   NA 138 05/21/2023 0847   NA 139 09/22/2017 0829   K 3.1 (L) 05/21/2023 0847   K 3.5 09/22/2017 0829   CL 100 05/21/2023 0847   CO2 31 05/21/2023 0847   CO2 26 09/22/2017 0829   GLUCOSE 200 (H)  05/21/2023 0847   GLUCOSE 133 09/22/2017 0829   BUN 12 05/21/2023 0847   BUN 14.1 09/22/2017 0829   CREATININE 0.73 05/21/2023 0847   CREATININE 0.9 09/22/2017 0829   CALCIUM 8.6 (L) 05/21/2023 0847   CALCIUM 9.1 09/22/2017 0829   PROT 7.1 05/04/2023 0951   PROT 6.8 09/22/2017 0829   ALBUMIN 4.3 05/04/2023 0951   ALBUMIN 4.1 09/22/2017 0829   AST 19 05/04/2023 0951   AST 22 09/22/2017 0829   ALT 14 05/04/2023 0951   ALT 30 09/22/2017 0829   ALKPHOS 65 05/04/2023 0951   ALKPHOS 55 09/22/2017 0829   BILITOT 0.2 (L) 05/04/2023 0951   BILITOT 0.35 09/22/2017 0829   GFRNONAA >60 05/21/2023 0847   GFRAA >60 07/05/2020 0845   GFRAA >60 02/06/2019 1215    No results found for: "SPEP", "UPEP"  Lab Results  Component Value Date   WBC 4.3 05/21/2023   NEUTROABS 2.3 05/21/2023   HGB 11.0 (L) 05/21/2023   HCT 33.2 (L) 05/21/2023   MCV 88.1 05/21/2023   PLT 143 (L) 05/21/2023      Chemistry      Component Value Date/Time   NA 138 05/21/2023 0847   NA 139 09/22/2017 0829   K 3.1 (L) 05/21/2023 0847   K 3.5 09/22/2017 0829   CL 100 05/21/2023 0847   CO2 31 05/21/2023 0847   CO2 26 09/22/2017 0829   BUN 12 05/21/2023 0847   BUN 14.1 09/22/2017 0829   CREATININE 0.73 05/21/2023 0847   CREATININE 0.9 09/22/2017 0829      Component Value Date/Time   CALCIUM 8.6 (L) 05/21/2023 0847   CALCIUM 9.1 09/22/2017 0829   ALKPHOS 65 05/04/2023 0951   ALKPHOS 55 09/22/2017 0829   AST 19 05/04/2023 0951   AST 22 09/22/2017 0829   ALT 14 05/04/2023 0951   ALT 30 09/22/2017 0829   BILITOT 0.2 (L) 05/04/2023 0951   BILITOT 0.35 09/22/2017 0829

## 2023-05-21 NOTE — Assessment & Plan Note (Signed)
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

## 2023-05-21 NOTE — Progress Notes (Signed)
Ok to treat today with Mag of 1.5 and potassium of 3.1 per Dr. Bertis Ruddy- Mag replacement ordered under supportive therapy orders.

## 2023-05-21 NOTE — Patient Instructions (Signed)
[facility]  Discharge Instructions: Thank you for choosing [facility] to provide your oncology and hematology care.   If you have a lab appointment with the Cancer Center, please go directly to the Cancer Center and check in at the registration area.   Wear comfortable clothing and clothing appropriate for easy access to any Portacath or PICC line.   We strive to give you quality time with your provider. You may need to reschedule your appointment if you arrive late (15 or more minutes).  Arriving late affects you and other patients whose appointments are after yours.  Also, if you miss three or more appointments without notifying the office, you may be dismissed from the clinic at the provider's discretion.      For prescription refill requests, have your pharmacy contact our office and allow 72 hours for refills to be completed.    Today you received the following chemotherapy and/or immunotherapy agents Erbitux      To help prevent nausea and vomiting after your treatment, we encourage you to take your nausea medication as directed.  BELOW ARE SYMPTOMS THAT SHOULD BE REPORTED IMMEDIATELY: *FEVER GREATER THAN 100.4 F (38 C) OR HIGHER *CHILLS OR SWEATING *NAUSEA AND VOMITING THAT IS NOT CONTROLLED WITH YOUR NAUSEA MEDICATION *UNUSUAL SHORTNESS OF BREATH *UNUSUAL BRUISING OR BLEEDING *URINARY PROBLEMS (pain or burning when urinating, or frequent urination) *BOWEL PROBLEMS (unusual diarrhea, constipation, pain near the anus) TENDERNESS IN MOUTH AND THROAT WITH OR WITHOUT PRESENCE OF ULCERS (sore throat, sores in mouth, or a toothache) UNUSUAL RASH, SWELLING OR PAIN  UNUSUAL VAGINAL DISCHARGE OR ITCHING   Items with * indicate a potential emergency and should be followed up as soon as possible or go to the Emergency Department if any problems should occur.  Please show the CHEMOTHERAPY ALERT CARD or IMMUNOTHERAPY ALERT CARD at check-in  to the Emergency Department and triage nurse.  Should you have questions after your visit or need to cancel or reschedule your appointment, please contact [facility]  Dept: [phone]  and follow the prompts.  Office hours are 8:00 a.m. to 4:30 p.m. Monday - Friday. Please note that voicemails left after 4:00 p.m. may not be returned until the following business day.  We are closed weekends and major holidays. You have access to a nurse at all times for urgent questions. Please call the main number to the clinic Dept: [phone] and follow the prompts.   For any non-urgent questions, you may also contact your provider using MyChart. We now offer e-Visits for anyone 65 and older to request care online for non-urgent symptoms. For details visit [email or web address].   Also download the MyChart app! Go to the app store, search "MyChart", open the app, select [place], and log in with your MyChart username and password.

## 2023-05-21 NOTE — Assessment & Plan Note (Signed)
This is due to side effects of chemotherapy I will prescribe Magic mouthwash with lidocaine swish and spit

## 2023-05-21 NOTE — Assessment & Plan Note (Signed)
We will give him IV magnesium and he is prescribed oral magnesium supplement

## 2023-05-21 NOTE — Assessment & Plan Note (Signed)
Likely due to side effects of treatment I recommend the patient to stop laxative temporarily

## 2023-05-22 ENCOUNTER — Other Ambulatory Visit: Payer: Self-pay

## 2023-05-25 ENCOUNTER — Telehealth: Payer: Self-pay

## 2023-05-25 ENCOUNTER — Encounter: Payer: Self-pay | Admitting: Hematology and Oncology

## 2023-05-25 NOTE — Telephone Encounter (Signed)
Returned his call and reviewed upcoming appts. He verbalized understanding.

## 2023-05-27 MED FILL — Fosaprepitant Dimeglumine For IV Infusion 150 MG (Base Eq): INTRAVENOUS | Qty: 5 | Status: AC

## 2023-05-27 MED FILL — Dexamethasone Sodium Phosphate Inj 100 MG/10ML: INTRAMUSCULAR | Qty: 1 | Status: AC

## 2023-05-28 ENCOUNTER — Inpatient Hospital Stay: Payer: Medicaid Other

## 2023-05-28 ENCOUNTER — Inpatient Hospital Stay: Payer: Medicaid Other | Admitting: Hematology and Oncology

## 2023-05-28 ENCOUNTER — Encounter: Payer: Self-pay | Admitting: Hematology and Oncology

## 2023-05-28 VITALS — HR 100

## 2023-05-28 VITALS — BP 142/89 | HR 110 | Temp 98.4°F | Resp 18 | Ht 66.0 in | Wt 185.4 lb

## 2023-05-28 DIAGNOSIS — C760 Malignant neoplasm of head, face and neck: Secondary | ICD-10-CM

## 2023-05-28 DIAGNOSIS — Z7189 Other specified counseling: Secondary | ICD-10-CM

## 2023-05-28 DIAGNOSIS — Z95828 Presence of other vascular implants and grafts: Secondary | ICD-10-CM

## 2023-05-28 DIAGNOSIS — D61818 Other pancytopenia: Secondary | ICD-10-CM

## 2023-05-28 DIAGNOSIS — Z5112 Encounter for antineoplastic immunotherapy: Secondary | ICD-10-CM | POA: Diagnosis not present

## 2023-05-28 DIAGNOSIS — C7800 Secondary malignant neoplasm of unspecified lung: Secondary | ICD-10-CM

## 2023-05-28 DIAGNOSIS — L270 Generalized skin eruption due to drugs and medicaments taken internally: Secondary | ICD-10-CM

## 2023-05-28 DIAGNOSIS — G893 Neoplasm related pain (acute) (chronic): Secondary | ICD-10-CM | POA: Diagnosis not present

## 2023-05-28 LAB — CBC WITH DIFFERENTIAL (CANCER CENTER ONLY)
Abs Immature Granulocytes: 0.01 10*3/uL (ref 0.00–0.07)
Basophils Absolute: 0 10*3/uL (ref 0.0–0.1)
Basophils Relative: 1 %
Eosinophils Absolute: 0.1 10*3/uL (ref 0.0–0.5)
Eosinophils Relative: 3 %
HCT: 34.9 % — ABNORMAL LOW (ref 39.0–52.0)
Hemoglobin: 11.6 g/dL — ABNORMAL LOW (ref 13.0–17.0)
Immature Granulocytes: 0 %
Lymphocytes Relative: 34 %
Lymphs Abs: 1.3 10*3/uL (ref 0.7–4.0)
MCH: 29.1 pg (ref 26.0–34.0)
MCHC: 33.2 g/dL (ref 30.0–36.0)
MCV: 87.5 fL (ref 80.0–100.0)
Monocytes Absolute: 0.6 10*3/uL (ref 0.1–1.0)
Monocytes Relative: 15 %
Neutro Abs: 1.8 10*3/uL (ref 1.7–7.7)
Neutrophils Relative %: 47 %
Platelet Count: 222 10*3/uL (ref 150–400)
RBC: 3.99 MIL/uL — ABNORMAL LOW (ref 4.22–5.81)
RDW: 15.8 % — ABNORMAL HIGH (ref 11.5–15.5)
WBC Count: 3.8 10*3/uL — ABNORMAL LOW (ref 4.0–10.5)
nRBC: 0 % (ref 0.0–0.2)

## 2023-05-28 LAB — CMP (CANCER CENTER ONLY)
ALT: 23 U/L (ref 0–44)
AST: 23 U/L (ref 15–41)
Albumin: 4 g/dL (ref 3.5–5.0)
Alkaline Phosphatase: 93 U/L (ref 38–126)
Anion gap: 7 (ref 5–15)
BUN: 11 mg/dL (ref 6–20)
CO2: 31 mmol/L (ref 22–32)
Calcium: 9.2 mg/dL (ref 8.9–10.3)
Chloride: 100 mmol/L (ref 98–111)
Creatinine: 0.86 mg/dL (ref 0.61–1.24)
GFR, Estimated: >60 mL/min (ref >60)
Glucose, Bld: 187 mg/dL — ABNORMAL HIGH (ref 70–99)
Potassium: 3.4 mmol/L — ABNORMAL LOW (ref 3.5–5.1)
Sodium: 138 mmol/L (ref 135–145)
Total Bilirubin: 0.3 mg/dL (ref 0.3–1.2)
Total Protein: 6.9 g/dL (ref 6.5–8.1)

## 2023-05-28 MED ORDER — SODIUM CHLORIDE 0.9 % IV SOLN
750.0000 mg | Freq: Once | INTRAVENOUS | Status: AC
  Administered 2023-05-28: 750 mg via INTRAVENOUS
  Filled 2023-05-28: qty 75

## 2023-05-28 MED ORDER — SODIUM CHLORIDE 0.9 % IV SOLN
150.0000 mg | Freq: Once | INTRAVENOUS | Status: AC
  Administered 2023-05-28: 150 mg via INTRAVENOUS
  Filled 2023-05-28: qty 150

## 2023-05-28 MED ORDER — DIPHENHYDRAMINE HCL 50 MG/ML IJ SOLN
12.5000 mg | Freq: Once | INTRAMUSCULAR | Status: AC
  Administered 2023-05-28: 12.5 mg via INTRAVENOUS
  Filled 2023-05-28: qty 1

## 2023-05-28 MED ORDER — SODIUM CHLORIDE 0.9 % IV SOLN: Freq: Once | INTRAVENOUS | Status: AC

## 2023-05-28 MED ORDER — SODIUM CHLORIDE 0.9% FLUSH
10.0000 mL | Freq: Once | INTRAVENOUS | Status: AC
  Administered 2023-05-28: 10 mL

## 2023-05-28 MED ORDER — PALONOSETRON HCL INJECTION 0.25 MG/5ML
0.2500 mg | Freq: Once | INTRAVENOUS | Status: AC
  Administered 2023-05-28: 0.25 mg via INTRAVENOUS
  Filled 2023-05-28: qty 5

## 2023-05-28 MED ORDER — SODIUM CHLORIDE 0.9% FLUSH
10.0000 mL | INTRAVENOUS | Status: DC | PRN
  Administered 2023-05-28: 10 mL

## 2023-05-28 MED ORDER — SODIUM CHLORIDE 0.9 % IV SOLN
1000.0000 mg/m2/d | INTRAVENOUS | Status: DC
  Administered 2023-05-28: 8500 mg via INTRAVENOUS
  Filled 2023-05-28: qty 170

## 2023-05-28 MED ORDER — SODIUM CHLORIDE 0.9 % IV SOLN
10.0000 mg | Freq: Once | INTRAVENOUS | Status: AC
  Administered 2023-05-28: 10 mg via INTRAVENOUS
  Filled 2023-05-28: qty 10

## 2023-05-28 NOTE — Assessment & Plan Note (Signed)
This is improved He will finish his current course of antibiotics and then resume metronidazole gel application I will reassess his skin condition next week

## 2023-05-28 NOTE — Progress Notes (Signed)
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) That mass is regressed in size He is showing positive response to therapy His skin rash is improving with antibiotics and recent dose adjustment He will proceed with cycle 2 of treatment I will see him again next week for further follow-up  Cancer associated pain This is much improved He is not ready for medication taper He will continue current dose of methadone and oxycodone as needed  Drug-induced skin rash This is improved He will finish his current course of antibiotics and then resume metronidazole gel application I will reassess his skin condition next week  Pancytopenia, acquired (HCC) He has mild persistent pancytopenia from prior treatment He is not symptomatic Observe closely for now  No orders of the defined types were placed in this encounter.   All questions were answered. The patient knows to call the clinic with any problems, questions or concerns. The total time spent in the appointment was 30 minutes encounter with patients including review of chart and various tests results, discussions about plan of care and coordination of care plan   Artis Delay, MD 05/28/2023 2:40 PM  INTERVAL HISTORY: Please see below for problem oriented charting. he returns for treatment follow-up  REVIEW OF SYSTEMS:   Constitutional: Denies fevers, chills or abnormal weight loss Eyes: Denies blurriness of vision Ears, nose, mouth, throat, and face: Denies mucositis or sore throat Respiratory: Denies cough, dyspnea or wheezes Cardiovascular: Denies palpitation, chest discomfort or lower extremity  swelling Gastrointestinal:  Denies nausea, heartburn or change in bowel habits Skin: Denies abnormal skin rashes Lymphatics: Denies new lymphadenopathy or easy bruising Neurological:Denies numbness, tingling or new weaknesses Behavioral/Psych: Mood is stable, no new changes  All other systems were reviewed with the patient and are negative.  I have reviewed the past medical history, past surgical history, social history and family history with the patient and they are unchanged from previous note.  ALLERGIES:  is allergic to phenergan [promethazine hcl], heparin, and clindamycin.  MEDICATIONS:  Current Outpatient Medications  Medication Sig Dispense Refill   blood glucose meter kit and supplies KIT Dispense based on patient and insurance preference. Use up to four times daily as directed. (FOR ICD-9 250.00, 250.01). 1 each 1   cetirizine (ZYRTEC) 10 MG tablet Take 1 tablet (10 mg total) by mouth daily as needed for allergies. 100 tablet 0   dexamethasone (DECADRON) 4 MG tablet Take 1 tablet (4 mg total) by mouth daily for 3 days after chemo, every 2 weeks. 30 tablet 0   famotidine (PEPCID) 40 MG tablet Take 1 tablet (40 mg total) by mouth at bedtime. 30 tablet 3   levothyroxine (SYNTHROID) 175 MCG tablet Take 1 tablet (175 mcg total) by mouth daily before breakfast. 30 tablet 3   LORazepam (ATIVAN) 0.5 MG tablet Take 1 tablet (0.5 mg total) by mouth 2 (two) times daily as needed for anxiety. 60 tablet 0   magic mouthwash (nystatin, lidocaine, diphenhydrAMINE, alum & mag hydroxide) suspension Swish and spit 5 mLs by mouth 4 (four) times daily for 10 days. 240 mL 0   magic mouthwash w/lidocaine SOLN Take 5 mLs by mouth 4 (four) times daily. 5 mls 4 x a  day for 10 days, swish and spit. 240 mL 0   metFORMIN (GLUCOPHAGE) 500 MG tablet Take 1 tablet (500 mg total) by mouth 2 (two) times daily with a meal. 60 tablet 2   methadone (DOLOPHINE) 10 MG tablet Take 1 tablet (10 mg) by mouth every 8 hours.  90 tablet 0   metroNIDAZOLE (METROGEL) 1 % gel Apply topically 2 (two) times daily as needed. 60 g 1   minocycline (MINOCIN) 100 MG capsule Take 1 capsule (100 mg total) by mouth 2 (two) times daily. 60 capsule 1   ondansetron (ZOFRAN) 8 MG tablet Take 1 tablet (8 mg total) by mouth every 8 (eight) hours as needed for nausea or vomiting. 90 tablet 1   oxyCODONE (ROXICODONE) 15 MG immediate release tablet Take 1 tablet (15 mg total) by mouth every 4 (four) hours as needed. 90 tablet 0   pantoprazole (PROTONIX) 40 MG tablet Take 1 tablet (40 mg total) by mouth daily. 30 tablet 2   polyethylene glycol (MIRALAX / GLYCOLAX) 17 g packet Take 17 g by mouth 2 (two) times daily.     prochlorperazine (COMPAZINE) 10 MG tablet Take 1 tablet (10 mg total) by mouth every 6 (six) hours as needed for nausea or vomiting. 30 tablet 2   senna-docusate (SENOKOT-S) 8.6-50 MG tablet Take 2 tablets by mouth 2 (two) times daily. 100 tablet 3   No current facility-administered medications for this visit.   Facility-Administered Medications Ordered in Other Visits  Medication Dose Route Frequency Provider Last Rate Last Admin   anticoagulant sodium citrate solution 5 mL  5 mL Intracatheter Once Bertis Ruddy, Prateek Knipple, MD       anticoagulant sodium citrate solution 5 mL  5 mL Intracatheter Once Bertis Ruddy, Cristyn Crossno, MD       fluorouracil (ADRUCIL) 8,500 mg in sodium chloride 0.9 % 80 mL chemo infusion  1,000 mg/m2/day (Treatment Plan Recorded) Intravenous 4 days Artis Delay, MD   Infusion Verify at 05/28/23 1404   sodium chloride flush (NS) 0.9 % injection 10 mL  10 mL Intracatheter Once Bertis Ruddy, Tibor Lemmons, MD       sodium chloride flush (NS) 0.9 % injection 10 mL  10 mL Intracatheter PRN Bertis Ruddy, Aquiles Ruffini, MD   10 mL at 05/28/23 1349    SUMMARY OF ONCOLOGIC HISTORY: Oncology History Overview Note  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 Pan-endoscopy elsewhere showed cancer from nasopharyngeal space.   01/04/2007 - 02/20/2007 Chemotherapy   He received 2 cycles of cisplatin and 5FU followed by concurrent chemo with weekly cisplatin and radiation. He only received 2 doses of chemo due to severe mucositis, nausea and weight loss.   04/05/2007 - 08/04/2007 Chemotherapy   He received 4 more courses of cisplatin with 5FU and had complete response   07/05/2009 Procedure   Fine-needle aspirate of the right level II lymph nodes come from recurrent metastatic disease. Repeat endoscopy and CT scan show no evidence of disease elsewhere.   07/08/2009 -  12/02/2009 Chemotherapy   He was given 6 cycles of carboplatin, 5-FU and docetaxel   12/03/2009 Surgery   He has surgery to the residual lymph node on the right neck which showed no evidence of disease.   02/22/2012 Imaging   Repeat imaging study showed large recurrent mass. He was referred elsewhere for further treatment.   05/03/2012 Surgery   He underwent left upper lobectomy.   04/29/2013 Imaging   PEt scan showed lesion on right level II B and lower lung was abnormal   06/03/2013 - 02/02/2014 Chemotherapy   He had 6 cycles of chemotherapy when he was found to have recurrence of cancer and had received oxaliplatin and capecitabine   06/07/2014 Imaging   PET CT scan showed persistent disease in the right neck lymph nodes and left lung   06/29/2014 Procedure   Accession: [id number] repeat LUL biopsy confirmed metastatic cancer   07/18/2014 - 07/31/2014 Radiation Therapy   He received palliative  radiation therapy to the lungs   10/10/2014 Imaging   CT scan of the chest, abdomen and pelvis show regression in the size of the lung nodule in the left upper lobe and stable pulmonary nodules   01/24/2015 Imaging   CT scan showed stable disease in neck and lung   06/19/2015 Imaging   CT scan of the neck and the chest show possible mild progression of the nodule in the right side of the neck.   06/25/2015 Imaging   PET scan confirmed disease recurrence in the neck   07/07/2015 Imaging   He had [facility]   09/03/2015 - 08/26/2018 Chemotherapy   He received palliative chemo with Nivolumab   10/29/2015 Imaging   PET CT showed positive response to Rx   02/28/2016 Imaging   Ct abdomen showed abnormal thinkening in his stomach   03/03/2016 Imaging   CT: Right sternocleidomastoid muscle metastasis appears less distinct but otherwise not significantly changed in size or configuration since 06/19/2015.2. Left level 3 lymph node which was hypermetabolic by PET-CT in January 2017 appears slightly smaller   04/01/2016 Imaging   CT cervical spine showed no acute fracture or traumatic malalignment in the cervical spine   04/22/2016 Procedure   Port-a-cath placed.   06/16/2016 Imaging   Ct neck showed right sternocleidomastoid muscle metastasis is further decreased in conspicuity since May, and has mildly decreased in size since September 2016. Continued stability of sub-centimeter left cervical lymph nodes. No new or progressive metastatic disease in the neck.   06/16/2016 Imaging   CT chest showed stable masslike radiation fibrosis in the left upper lobe. Stable subcentimeter pulmonary nodules in the bilateral lower lobes. No new or progressive metastatic disease in the chest. Nonobstructing left renal stone.   10/13/2016 Imaging   Ct neck showed unchanged right sternocleidomastoid muscle metastasis. Unchanged subcentimeter left cervical lymph nodes. No evidence of new or progressive metastatic  disease in the neck.   10/13/2016 Imaging   CT chest showed tiny hypervascular foci in the liver, not definitely seen on prior imaging of 06/16/2016 and 02/28/2016. Abdomen MRI without and with contrast recommended to further evaluate as metastatic disease is a concern. 2. Stable appearance of post treatment changes left upper lung and scattered tiny bilateral pulmonary nodules.   02/11/2017 Imaging   Ct neck: Lymph node mass right posterior neck appears improved from the prior study. Small posterior lymph nodes on the left unchanged. Occluded right jugular vein unchanged.   02/11/2017 Imaging   1. Similar appearance of  postsurgical and radiation changes in the left upper lobe. 2. Similar bilateral pulmonary nodules. 3. No thoracic adenopathy. 4. Subtle foci of post-contrast enhancement within the liver are suboptimally characterized on this nondedicated study. Likely similar. These could either be re-evaluated at followup or more entirely characterized with abdominal MRI. 5. Left nephrolithiasis.   05/19/2017 Imaging   Matted lymph node mass right posterior neck appears larger in the recent CT. Accurate measurements difficult due to infiltrating tumor margins and infiltration of the muscle. Right jugular vein again appears occluded or resected. Small left posterior lymph nodes stable. Left upper lobe airspace density stable and similar to the prior CT   06/03/2017 PET scan   1. Hypermetabolic ill-defined right level IIb lymph node, about 1.3 cm in diameter with maximum SUV 9.5 (formerly 8.1). Appearance suspicious for residual/recurrent malignancy. No worrisome left-sided lesion. 2. Left suprahilar indistinct opacity demonstrates no worrisome hypermetabolic activity. The 5 mm left lower lobe pulmonary nodule is stable and not currently hypermetabolic although below sensitive PET-CT size thresholds. 3. Other imaging findings of potential clinical significance: Bilateral nonobstructive  nephrolithiasis. Chronic bilateral maxillary sinusitis.   05/11/2018 PET scan   1. Continued chronic accentuated metabolic activity in the vicinity of right level IIB and the adjacent right sternocleidomastoid muscle, with ill definition of surrounding tissue planes. Maximum SUV is currently 8.1, formerly 9.5. Accentuated metabolic activity is been present in this vicinity back through 06/25/2015, and there was also some low-level activity in this vicinity on 06/07/2014. Some of this may be from scarring and local muscular activity although clearly a component of residual tumor is difficult to exclude given the focally high activity. 2. Other imaging findings of potential clinical significance: Chronic bilateral maxillary sinusitis. Chronic scarring in the left upper lobe. Chronically stable 5 mm left lower lobe nodule is considered benign. Nonobstructive left nephrolithiasis.   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 COMMENT:No significant keratinization is identified. Other basaloid carcinomas are in the differential diagnosis. No cell block material is available for further testing.   09/12/2018 Procedure   He underwent fine Needle Aspiration   10/04/2018 PET scan   1. Significant progression of local recurrence laterally in the mid right neck with an enlarging, increasingly hypermetabolic soft tissue mass. This involves the right sternocleidomastoid muscle. 2. Small lymph nodes in the right axilla are increasingly hypermetabolic. These are nonspecific and potentially reactive, although could reflect a small metastases. Small hypermetabolic nodule in the left suprasternal notch is unchanged. 3. No other evidence of metastatic disease.     10/07/2018 - 12/23/2018 Chemotherapy   The patient had cisplatin plus gemzar   12/07/2018 Imaging   1.  Decreased size of lateral right neck mass. 2. Unchanged soft tissue nodule in the suprasternal notch. 3. No evidence of new metastatic disease in the neck.     05/30/2019 Imaging   CT neck No clear change or progression compared to the study of March. Overall measurements of the right lateral neck mass are similar, approximately 3 x 1.8 cm. See above discussion. One could argue that there is slight increase in lateral bulging, possibly with an increase in contrast enhancement, towards the inferior margin. This is of questionable validity but could possibly represent some progression or inflammatory change. Other findings in the region are stable.   07/20/2019 - 09/15/2019 Chemotherapy   The patient had gemzar and  cisplatin   10/02/2019 Imaging   CT neck As compared to 05/30/2019, no significant interval change in size of an ill-defined mass within the right lateral neck, again measuring 3.3 x 1.8 cm in transaxial dimensions.   Unchanged mildly enlarged left level I lymph node measuring 1.1 cm in short axis.   Unchanged node or nodule at the thoracic inlet, measuring 1.3 x 0.8 cm.   Please refer to concurrently performed chest CT for a description of findings below the level of the thoracic inlet.     10/02/2019 Imaging   CT chest 1. No new or progressive findings in the chest to suggest metastatic disease. 2. Bilateral subcentimeter solid pulmonary nodules are stable since 2018. 3. Hyperdense 1.1 cm anterior liver focus, not clearly visualized on prior studies. Suggest MRI abdomen without and with IV contrast for further characterization.   10/20/2019 - 12/29/2019 Chemotherapy   The patient had gemzar maintenance   06/12/2020 Imaging   1. Enlarging superficial, exophytic component of the chronic right sternocleidomastoid muscle mass. See series 6, image 55. 2. Elsewhere stable CT appearance of the Neck.   06/12/2020 Imaging   Post treatment scarring in the left hemithorax, stable. No  evidence recurrent or metastatic disease   06/14/2020 - 10/15/2020 Chemotherapy   He received carboplatin, 5FU and Rande Lawman       10/31/2020 Procedure   Interval improvement in right lateral lymph node mass. Improvement in dermal component as well as invasion of the right sternocleidomastoid muscle.   10 mm submental lymph node slightly enlarged compared to the prior study. Continued follow-up recommended.   11/01/2020 - 05/22/2022 Chemotherapy   Patient is on Treatment Plan : HEAD/NECK Pembrolizumab Q21D     07/03/2022 - 10/30/2022 Chemotherapy   Patient is on Treatment Plan : Head and neck Pembrolizumab (400) q42d     11/30/2022 Imaging   CT chest  1. Stable exam. No new or progressive findings to suggest recurrent or metastatic disease. 2. Stable tiny bilateral nodules since 06/12/2020, consistent with benign etiology. 3. Punctate nonobstructing left renal stone.   12/01/2022 Imaging   CT neck  Growing right neck mass since 2022 with epidural tumor extension via the right C2-3 foramen. Cervical MRI with contrast would be contributory.   12/11/2022 - 04/02/2023 Chemotherapy   Patient is on Treatment Plan : HEAD/NECK toripalimab-tpzi D1 + cisplatin D1 + gemcitabine D1,8 q21d x 6 cycles / toripalimab-tpzi q21d (up to 8m)     04/15/2023 Imaging   CT Chest W Contrast  Result Date: 04/15/2023 CLINICAL DATA:  Right neck mass, nasopharyngeal carcinoma EXAM: CT CHEST WITH CONTRAST TECHNIQUE: Multidetector CT imaging of the chest was performed during intravenous contrast administration. RADIATION DOSE REDUCTION: This exam was performed according to the departmental dose-optimization program which includes automated exposure control, adjustment of the mA and/or kV according to patient size and/or use of iterative reconstruction technique. CONTRAST:  75mL OMNIPAQUE IOHEXOL 300 MG/ML  SOLN COMPARISON:  11/27/2022 FINDINGS: Cardiovascular: Right Port-A-Cath tip: Right atrium. Mediastinum/Nodes: Small  left supraclavicular lymph node 0.5 cm in short axis on image 4 series 3, formerly the same. No pathologic adenopathy in the chest. Lungs/Pleura: Biapical pleuroparenchymal scarring, right greater than left. Stable left upper lobe scarring and peribronchovascular density with wedge resection line in the left upper lobe, no significant contour change of density in this vicinity to suggest malignancy. Stable 5 by 4 mm left lower lobe nodule and stable 3 by 4 mm right lower lobe nodule, unchanged from at least 2021,  compatible with benign etiology. No new lesion identified. Upper Abdomen: 2 mm left kidney upper pole nonobstructive renal calculus, image 43 series 7. Musculoskeletal: Unremarkable IMPRESSION: 1. Stable postoperative findings in the left upper lobe. 2. Stable small bilateral lower lobe pulmonary nodules, unchanged from at least 2021, compatible with benign etiology. 3. 2 mm left kidney upper pole nonobstructive renal calculus. Electronically Signed   By: Gaylyn Rong M.D.   On: 04/15/2023 14:03   CT Soft Tissue Neck W Contrast  Result Date: 04/15/2023 CLINICAL DATA:  Head/neck cancer.  Last chemo 04/02/2023. EXAM: CT NECK WITH CONTRAST TECHNIQUE: Multidetector CT imaging of the neck was performed using the standard protocol following the bolus administration of intravenous contrast. RADIATION DOSE REDUCTION: This exam was performed according to the departmental dose-optimization program which includes automated exposure control, adjustment of the mA and/or kV according to patient size and/or use of iterative reconstruction technique. CONTRAST:  75mL OMNIPAQUE IOHEXOL 300 MG/ML  SOLN COMPARISON:  CT neck 11/27/2022. FINDINGS: Pharynx and larynx: There is probable mucoid debris in the right nasal cavity/nasopharynx. The nasal cavity and nasopharynx are otherwise unremarkable. The oral cavity and oropharynx are unremarkable. The parapharyngeal spaces are clear. The hypopharynx and larynx are  unremarkable. The epiglottis is normal. There is no retropharyngeal collection. The airway is patent. Salivary glands: Atrophy of the parotid and left submandibular glands is unchanged. The right submandibular gland is again not identified and may be surgically absent. Thyroid: Unremarkable. Lymph nodes: The infiltrative mass in the right neck is again seen. The mass invades through the right C2-C3 neural foramen into the epidural space resulting in mild narrowing of the thecal sac without suspected cord compression. The degree of intraspinal epidural tumor appears slightly increased compared to the study from 11/27/2022. Mass again encases the vertebral artery at the C2-C3 level (4-36), unchanged. The remainder of the infiltrative mass in the paraspinal musculature with extension into the overlying skin surface does not appear significantly changed in size or extent compared to the prior study. A 0.9 cm midline level I lymph node is unchanged going back to 2021. Otherwise, there is no new or progressive lymphadenopathy in the neck. Vascular: As above, there is encasement of the right vertebral artery at the C2-C3 level by the right neck mass. There is mild calcified plaque at the carotid bifurcations. The right IJ is occluded or sacrificed, unchanged. The left IJ is patent. A right chest wall port is partially imaged. Limited intracranial: Unremarkable. Visualized orbits: Unremarkable. Mastoids and visualized paranasal sinuses: There is mild mucosal thickening in the maxillary sinuses. The mastoid air cells and middle ear cavities are clear. Skeleton: There is no acute osseous abnormality or suspicious osseous lesion. Upper chest: Assessed on the separately dictated CT chest. Other: None. IMPRESSION: 1. Infiltrative mass again seen in the right neck centered in the paraspinal musculature with invasion through the right C2-C3 neural foramen. The bulk of the epidural tumor at this level appears slightly increased in  size compared to the prior study from 11/27/2022 with probable leftward displacement of the cord but without frank cord compression. The remainder of the mass in the right neck is otherwise not significantly changed. 2. No new or progressive lymphadenopathy in the remainder of the neck. 3. Probable mucoid debris in the right nasal cavity/nasopharynx. No suspicious mass lesion or enhancement in the nasopharynx. Electronically Signed   By: Lesia Hausen M.D.   On: 04/15/2023 14:02      05/07/2023 -  Chemotherapy   Patient  is on Treatment Plan : HEAD/NECK Cetuximab q14d + Carboplatin + 5FU IVCI D1-4 q21d x 6 cycles / Cetuximab q14d       PHYSICAL EXAMINATION: ECOG PERFORMANCE STATUS: 1 - Symptomatic but completely ambulatory  Vitals:   05/28/23 1054  BP: (!) 142/89  Pulse: (!) 110  Resp: 18  Temp: 98.4 F (36.9 C)  SpO2: 100%   Filed Weights   05/28/23 1054  Weight: 185 lb 6.4 oz (84.1 kg)    GENERAL:alert, no distress and comfortable SKIN: s his skin rash has improved  EYES: normal, Conjunctiva are pink and non-injected, sclera clear OROPHARYNX:no exudate, no erythema and lips, buccal mucosa, and tongue normal  NECK: The neck mass is reduced in size NEURO: alert & oriented x 3 with fluent speech, no focal motor/sensory deficits  LABORATORY DATA:  I have reviewed the data as listed    Component Value Date/Time   NA 138 05/28/2023 1025   NA 139 09/22/2017 0829   K 3.4 (L) 05/28/2023 1025   K 3.5 09/22/2017 0829   CL 100 05/28/2023 1025   CO2 31 05/28/2023 1025   CO2 26 09/22/2017 0829   GLUCOSE 187 (H) 05/28/2023 1025   GLUCOSE 133 09/22/2017 0829   BUN 11 05/28/2023 1025   BUN 14.1 09/22/2017 0829   CREATININE 0.86 05/28/2023 1025   CREATININE 0.9 09/22/2017 0829   CALCIUM 9.2 05/28/2023 1025   CALCIUM 9.1 09/22/2017 0829   PROT 6.9 05/28/2023 1025   PROT 6.8 09/22/2017 0829   ALBUMIN 4.0 05/28/2023 1025   ALBUMIN 4.1 09/22/2017 0829   AST 23 05/28/2023 1025   AST 22  09/22/2017 0829   ALT 23 05/28/2023 1025   ALT 30 09/22/2017 0829   ALKPHOS 93 05/28/2023 1025   ALKPHOS 55 09/22/2017 0829   BILITOT 0.3 05/28/2023 1025   BILITOT 0.35 09/22/2017 0829   GFRNONAA >60 05/28/2023 1025   GFRAA >60 07/05/2020 0845   GFRAA >60 02/06/2019 1215    No results found for: "SPEP", "UPEP"  Lab Results  Component Value Date   WBC 3.8 (L) 05/28/2023   NEUTROABS 1.8 05/28/2023   HGB 11.6 (L) 05/28/2023   HCT 34.9 (L) 05/28/2023   MCV 87.5 05/28/2023   PLT 222 05/28/2023      Chemistry      Component Value Date/Time   NA 138 05/28/2023 1025   NA 139 09/22/2017 0829   K 3.4 (L) 05/28/2023 1025   K 3.5 09/22/2017 0829   CL 100 05/28/2023 1025   CO2 31 05/28/2023 1025   CO2 26 09/22/2017 0829   BUN 11 05/28/2023 1025   BUN 14.1 09/22/2017 0829   CREATININE 0.86 05/28/2023 1025   CREATININE 0.9 09/22/2017 0829      Component Value Date/Time   CALCIUM 9.2 05/28/2023 1025   CALCIUM 9.1 09/22/2017 0829   ALKPHOS 93 05/28/2023 1025   ALKPHOS 55 09/22/2017 0829   AST 23 05/28/2023 1025   AST 22 09/22/2017 0829   ALT 23 05/28/2023 1025   ALT 30 09/22/2017 0829   BILITOT 0.3 05/28/2023 1025   BILITOT 0.35 09/22/2017 0829

## 2023-05-28 NOTE — Assessment & Plan Note (Signed)
That mass is regressed in size He is showing positive response to therapy His skin rash is improving with antibiotics and recent dose adjustment He will proceed with cycle 2 of treatment I will see him again next week for further follow-up

## 2023-05-28 NOTE — Assessment & Plan Note (Signed)
This is much improved He is not ready for medication taper He will continue current dose of methadone and oxycodone as needed

## 2023-05-28 NOTE — Patient Instructions (Signed)
[facility]  Discharge Instructions: Thank you for choosing [facility] to provide your oncology and hematology care.   If you have a lab appointment with the Cancer Center, please go directly to the Cancer Center and check in at the registration area.   Wear comfortable clothing and clothing appropriate for easy access to any Portacath or PICC line.   We strive to give you quality time with your provider. You may need to reschedule your appointment if you arrive late (15 or more minutes).  Arriving late affects you and other patients whose appointments are after yours.  Also, if you miss three or more appointments without notifying the office, you may be dismissed from the clinic at the provider's discretion.      For prescription refill requests, have your pharmacy contact our office and allow 72 hours for refills to be completed.    Today you received the following chemotherapy and/or immunotherapy agents: Carboplatin/Fluorouracil      To help prevent nausea and vomiting after your treatment, we encourage you to take your nausea medication as directed.  BELOW ARE SYMPTOMS THAT SHOULD BE REPORTED IMMEDIATELY: *FEVER GREATER THAN 100.4 F (38 C) OR HIGHER *CHILLS OR SWEATING *NAUSEA AND VOMITING THAT IS NOT CONTROLLED WITH YOUR NAUSEA MEDICATION *UNUSUAL SHORTNESS OF BREATH *UNUSUAL BRUISING OR BLEEDING *URINARY PROBLEMS (pain or burning when urinating, or frequent urination) *BOWEL PROBLEMS (unusual diarrhea, constipation, pain near the anus) TENDERNESS IN MOUTH AND THROAT WITH OR WITHOUT PRESENCE OF ULCERS (sore throat, sores in mouth, or a toothache) UNUSUAL RASH, SWELLING OR PAIN  UNUSUAL VAGINAL DISCHARGE OR ITCHING   Items with * indicate a potential emergency and should be followed up as soon as possible or go to the Emergency Department if any problems should occur.  Please show the CHEMOTHERAPY ALERT CARD or IMMUNOTHERAPY  ALERT CARD at check-in to the Emergency Department and triage nurse.  Should you have questions after your visit or need to cancel or reschedule your appointment, please contact [facility]  Dept: [phone]  and follow the prompts.  Office hours are 8:00 a.m. to 4:30 p.m. Monday - Friday. Please note that voicemails left after 4:00 p.m. may not be returned until the following business day.  We are closed weekends and major holidays. You have access to a nurse at all times for urgent questions. Please call the main number to the clinic Dept: [phone] and follow the prompts.   For any non-urgent questions, you may also contact your provider using MyChart. We now offer e-Visits for anyone 70 and older to request care online for non-urgent symptoms. For details visit [email or web address].   Also download the MyChart app! Go to the app store, search "MyChart", open the app, select [facility], and log in with your MyChart username and password.

## 2023-05-28 NOTE — Assessment & Plan Note (Signed)
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

## 2023-05-31 ENCOUNTER — Telehealth: Payer: Self-pay

## 2023-05-31 NOTE — Telephone Encounter (Signed)
Returned his call. He is requesting a refill of Methadone and oxycodone please to [facility].  He has enough to last until tomorrow. Told him I would ask Dr. Bertis Ruddy to send tomorrow in the am.

## 2023-06-01 ENCOUNTER — Telehealth: Payer: Self-pay

## 2023-06-01 ENCOUNTER — Other Ambulatory Visit: Payer: Self-pay | Admitting: Hematology and Oncology

## 2023-06-01 ENCOUNTER — Other Ambulatory Visit (HOSPITAL_BASED_OUTPATIENT_CLINIC_OR_DEPARTMENT_OTHER): Payer: Self-pay

## 2023-06-01 ENCOUNTER — Inpatient Hospital Stay: Payer: Medicaid Other

## 2023-06-01 ENCOUNTER — Encounter: Payer: Self-pay | Admitting: Hematology and Oncology

## 2023-06-01 ENCOUNTER — Other Ambulatory Visit (HOSPITAL_COMMUNITY): Payer: Self-pay

## 2023-06-01 ENCOUNTER — Telehealth: Payer: Self-pay | Admitting: *Deleted

## 2023-06-01 VITALS — BP 116/82 | HR 105 | Temp 98.3°F | Resp 16

## 2023-06-01 DIAGNOSIS — C760 Malignant neoplasm of head, face and neck: Secondary | ICD-10-CM

## 2023-06-01 DIAGNOSIS — Z7189 Other specified counseling: Secondary | ICD-10-CM

## 2023-06-01 MED ORDER — SODIUM CHLORIDE 0.9% FLUSH
10.0000 mL | INTRAVENOUS | Status: DC | PRN
  Administered 2023-06-01: 10 mL

## 2023-06-01 MED ORDER — METHADONE HCL 10 MG PO TABS
10.0000 mg | ORAL_TABLET | Freq: Three times a day (TID) | ORAL | 0 refills | Status: DC
Start: 2023-06-01 — End: 2023-06-30
  Filled 2023-06-01: qty 40, 13d supply, fill #0
  Filled 2023-06-01: qty 50, 17d supply, fill #0
  Filled 2023-06-01 (×2): qty 90, 30d supply, fill #0

## 2023-06-01 MED ORDER — OXYCODONE HCL 15 MG PO TABS
15.0000 mg | ORAL_TABLET | ORAL | 0 refills | Status: DC | PRN
Start: 2023-06-01 — End: 2023-06-04
  Filled 2023-06-01 (×2): qty 90, 15d supply, fill #0

## 2023-06-01 NOTE — Telephone Encounter (Signed)
Refill sent.

## 2023-06-01 NOTE — Telephone Encounter (Signed)
Called and told Rx sent to pharmacy. He verbalized understanding. 

## 2023-06-01 NOTE — Telephone Encounter (Signed)
Returned his call. Insurance is requiring a PA on oxycodone. Told him that the office sent a message to PA team to get PA. Per pharmacy oxycodone will be $42.79 if he wants to pay out of pocket. He verbalized understanding and will call office back for questions/ concerns.

## 2023-06-01 NOTE — Telephone Encounter (Signed)
Medication Prior Authorization Status  Processed CoverMyMeds KEY: [id number]  Approved Today  Per [employer] [place] Medicaid Case ID: [id number]   Effective 05/31/2022 through 11/28/2023.

## 2023-06-01 NOTE — Telephone Encounter (Signed)
Called him and told him oxycodone PA completed and her can pick up Rx. He verbalized understanding and will pick up Rx.

## 2023-06-02 ENCOUNTER — Other Ambulatory Visit (HOSPITAL_COMMUNITY): Payer: Self-pay

## 2023-06-02 ENCOUNTER — Other Ambulatory Visit: Payer: Self-pay

## 2023-06-03 ENCOUNTER — Encounter: Payer: Self-pay | Admitting: Hematology and Oncology

## 2023-06-03 ENCOUNTER — Other Ambulatory Visit (HOSPITAL_BASED_OUTPATIENT_CLINIC_OR_DEPARTMENT_OTHER): Payer: Self-pay

## 2023-06-04 ENCOUNTER — Other Ambulatory Visit: Payer: Self-pay

## 2023-06-04 ENCOUNTER — Inpatient Hospital Stay: Payer: Medicaid Other

## 2023-06-04 ENCOUNTER — Other Ambulatory Visit: Payer: Self-pay | Admitting: *Deleted

## 2023-06-04 ENCOUNTER — Other Ambulatory Visit (HOSPITAL_BASED_OUTPATIENT_CLINIC_OR_DEPARTMENT_OTHER): Payer: Self-pay

## 2023-06-04 ENCOUNTER — Inpatient Hospital Stay: Payer: Medicaid Other | Admitting: Hematology and Oncology

## 2023-06-04 ENCOUNTER — Encounter: Payer: Self-pay | Admitting: Hematology and Oncology

## 2023-06-04 VITALS — BP 130/83 | HR 88 | Temp 98.3°F | Resp 18 | Ht 66.0 in | Wt 184.8 lb

## 2023-06-04 VITALS — BP 148/107 | HR 116

## 2023-06-04 DIAGNOSIS — C760 Malignant neoplasm of head, face and neck: Secondary | ICD-10-CM

## 2023-06-04 DIAGNOSIS — K1231 Oral mucositis (ulcerative) due to antineoplastic therapy: Secondary | ICD-10-CM | POA: Diagnosis not present

## 2023-06-04 DIAGNOSIS — Z95828 Presence of other vascular implants and grafts: Secondary | ICD-10-CM

## 2023-06-04 DIAGNOSIS — C7800 Secondary malignant neoplasm of unspecified lung: Secondary | ICD-10-CM

## 2023-06-04 DIAGNOSIS — Z7189 Other specified counseling: Secondary | ICD-10-CM

## 2023-06-04 DIAGNOSIS — Z5112 Encounter for antineoplastic immunotherapy: Secondary | ICD-10-CM | POA: Diagnosis not present

## 2023-06-04 DIAGNOSIS — G893 Neoplasm related pain (acute) (chronic): Secondary | ICD-10-CM

## 2023-06-04 DIAGNOSIS — F411 Generalized anxiety disorder: Secondary | ICD-10-CM

## 2023-06-04 LAB — CBC WITH DIFFERENTIAL (CANCER CENTER ONLY)
Abs Immature Granulocytes: 0.01 10*3/uL (ref 0.00–0.07)
Basophils Absolute: 0 10*3/uL (ref 0.0–0.1)
Basophils Relative: 0 %
Eosinophils Absolute: 0.1 10*3/uL (ref 0.0–0.5)
Eosinophils Relative: 2 %
HCT: 33.5 % — ABNORMAL LOW (ref 39.0–52.0)
Hemoglobin: 11 g/dL — ABNORMAL LOW (ref 13.0–17.0)
Immature Granulocytes: 0 %
Lymphocytes Relative: 31 %
Lymphs Abs: 1 10*3/uL (ref 0.7–4.0)
MCH: 28.5 pg (ref 26.0–34.0)
MCHC: 32.8 g/dL (ref 30.0–36.0)
MCV: 86.8 fL (ref 80.0–100.0)
Monocytes Absolute: 0.2 10*3/uL (ref 0.1–1.0)
Monocytes Relative: 5 %
Neutro Abs: 2 10*3/uL (ref 1.7–7.7)
Neutrophils Relative %: 62 %
Platelet Count: 275 10*3/uL (ref 150–400)
RBC: 3.86 MIL/uL — ABNORMAL LOW (ref 4.22–5.81)
RDW: 15.1 % (ref 11.5–15.5)
WBC Count: 3.3 10*3/uL — ABNORMAL LOW (ref 4.0–10.5)
nRBC: 0 % (ref 0.0–0.2)

## 2023-06-04 LAB — BASIC METABOLIC PANEL - CANCER CENTER ONLY
Anion gap: 6 (ref 5–15)
BUN: 15 mg/dL (ref 6–20)
CO2: 30 mmol/L (ref 22–32)
Calcium: 8.6 mg/dL — ABNORMAL LOW (ref 8.9–10.3)
Chloride: 100 mmol/L (ref 98–111)
Creatinine: 0.82 mg/dL (ref 0.61–1.24)
GFR, Estimated: >60 mL/min (ref >60)
Glucose, Bld: 196 mg/dL — ABNORMAL HIGH (ref 70–99)
Potassium: 3.6 mmol/L (ref 3.5–5.1)
Sodium: 136 mmol/L (ref 135–145)

## 2023-06-04 LAB — MAGNESIUM: Magnesium: 1.6 mg/dL — ABNORMAL LOW (ref 1.7–2.4)

## 2023-06-04 MED ORDER — OXYCODONE HCL 10 MG PO TABS: 10.0000 mg | ORAL_TABLET | ORAL | Status: DC | PRN

## 2023-06-04 MED ORDER — MAGIC MOUTHWASH W/LIDOCAINE
5.0000 mL | Freq: Four times a day (QID) | ORAL | 0 refills | Status: DC
Start: 2023-06-04 — End: 2023-08-27

## 2023-06-04 MED ORDER — PROCHLORPERAZINE EDISYLATE 10 MG/2ML IJ SOLN
5.0000 mg | Freq: Once | INTRAMUSCULAR | Status: AC
  Administered 2023-06-04: 5 mg via INTRAVENOUS
  Filled 2023-06-04: qty 2

## 2023-06-04 MED ORDER — DIPHENHYDRAMINE HCL 25 MG PO CAPS: 25.0000 mg | ORAL_CAPSULE | Freq: Once | ORAL | Status: DC

## 2023-06-04 MED ORDER — LORAZEPAM 1 MG PO TABS: 0.5000 mg | ORAL_TABLET | Freq: Once | ORAL | Status: DC

## 2023-06-04 MED ORDER — SODIUM CHLORIDE 0.9% FLUSH
10.0000 mL | Freq: Once | INTRAVENOUS | Status: AC
  Administered 2023-06-04: 10 mL

## 2023-06-04 MED ORDER — SODIUM CHLORIDE 0.9 % IV SOLN: Freq: Once | INTRAVENOUS | Status: AC

## 2023-06-04 MED ORDER — NYSTATIN 100000 UNIT/ML MT SUSP
5.0000 mL | Freq: Four times a day (QID) | OROMUCOSAL | 0 refills | Status: AC
  Filled 2023-06-04 – 2023-08-27 (×2): qty 240, 12d supply, fill #0

## 2023-06-04 NOTE — Progress Notes (Signed)
[facility] OFFICE PROGRESS NOTE  Simmons Care Team: Artis Delay, MD as PCP - General (Hematology and Oncology) Artis Delay, MD as Consulting Physician (Hematology and Oncology) Elfredia Nevins, MD as Referring Physician (Plastic Surgery) Hilarie Fredrickson, MD as Consulting Physician (Gastroenterology)  ASSESSMENT & PLAN:  Malignant neoplasm of head and neck [facility]) Christian Simmons has an amazing response.  Christian neck mass has completely disappeared.  He is very happy with Christian positive response to chemotherapy Unfortunately, he had nausea and signs of mucositis I recommend we hold his cetuximab treatment today and keep his appointment as scheduled in 2 weeks and focus on supportive care He is in agreement  Cancer associated pain He has less cancer associated pain with resolution of his disease He is interested for medication taper I will start to reduce Christian dose of his oxycodone  Mucositis due to antineoplastic therapy He has significant signs of mucositis from treatment I recommend holding chemotherapy today I recommend Magic mouthwash and IV fluid support and he agrees  No orders of Christian defined types were placed in this encounter.   All questions were answered. Christian Simmons knows to call Christian clinic with any problems, questions or concerns. Christian total time spent in Christian appointment was 40 minutes encounter with patients including review of chart and various tests results, discussions about plan of care and coordination of care plan   Artis Delay, MD 06/04/2023 3:35 PM  INTERVAL HISTORY: Please see below for problem oriented charting. he returns for treatment follow-up He is seen prior to next cycle of treatment He has some mucositis and nausea but no diarrhea or vomiting He is pleased to see that Christian neck mass has resolved He has minimal pain  REVIEW OF SYSTEMS:   Constitutional: Denies fevers, chills or abnormal weight loss Eyes: Denies blurriness of vision Respiratory:  Denies cough, dyspnea or wheezes Cardiovascular: Denies palpitation, chest discomfort or lower extremity swelling Gastrointestinal:  Denies nausea, heartburn or change in bowel habits Skin: Denies abnormal skin rashes Lymphatics: Denies new lymphadenopathy or easy bruising Neurological:Denies numbness, tingling or new weaknesses Behavioral/Psych: Mood is stable, no new changes  All other systems were reviewed with Christian Simmons and are negative.  I have reviewed Christian past medical history, past surgical history, social history and family history with Christian Simmons and they are unchanged from previous note.  ALLERGIES:  is allergic to phenergan [promethazine hcl], heparin, and clindamycin.  MEDICATIONS:  Current Outpatient Medications  Medication Sig Dispense Refill   blood glucose meter kit and supplies KIT Dispense based on Simmons and insurance preference. Use up to four times daily as directed. (FOR ICD-9 250.00, 250.01). 1 each 1   cetirizine (ZYRTEC) 10 MG tablet Take 1 tablet (10 mg total) by mouth daily as needed for allergies. 100 tablet 0   dexamethasone (DECADRON) 4 MG tablet Take 1 tablet (4 mg total) by mouth daily for 3 days after chemo, every 2 weeks. 30 tablet 0   famotidine (PEPCID) 40 MG tablet Take 1 tablet (40 mg total) by mouth at bedtime. 30 tablet 3   levothyroxine (SYNTHROID) 175 MCG tablet Take 1 tablet (175 mcg total) by mouth daily before breakfast. 30 tablet 3   LORazepam (ATIVAN) 0.5 MG tablet Take 1 tablet (0.5 mg total) by mouth 2 (two) times daily as needed for anxiety. 60 tablet 0   magic mouthwash (nystatin, lidocaine, diphenhydrAMINE, alum & mag hydroxide) suspension Swish and spit 5 mLs by mouth 4 (four) times daily  for 10 days. 240 mL 0   magic mouthwash w/lidocaine SOLN Take 5 mLs by mouth 4 (four) times daily. 5 mls 4 x a day for 10 days, swish and spit. 240 mL 0   metFORMIN (GLUCOPHAGE) 500 MG tablet Take 1 tablet (500 mg total) by mouth 2 (two) times daily  with a meal. 60 tablet 2   methadone (DOLOPHINE) 10 MG tablet Take 1 tablet (10 mg) by mouth every 8 hours. 90 tablet 0   metroNIDAZOLE (METROGEL) 1 % gel Apply topically 2 (two) times daily as needed. 60 g 1   minocycline (MINOCIN) 100 MG capsule Take 1 capsule (100 mg total) by mouth 2 (two) times daily. 60 capsule 1   ondansetron (ZOFRAN) 8 MG tablet Take 1 tablet (8 mg total) by mouth every 8 (eight) hours as needed for nausea or vomiting. 90 tablet 1   oxyCODONE 10 MG TABS Take 1 tablet (10 mg total) by mouth every 4 (four) hours as needed.     pantoprazole (PROTONIX) 40 MG tablet Take 1 tablet (40 mg total) by mouth daily. 30 tablet 2   polyethylene glycol (MIRALAX / GLYCOLAX) 17 g packet Take 17 g by mouth 2 (two) times daily.     prochlorperazine (COMPAZINE) 10 MG tablet Take 1 tablet (10 mg total) by mouth every 6 (six) hours as needed for nausea or vomiting. 30 tablet 2   senna-docusate (SENOKOT-S) 8.6-50 MG tablet Take 2 tablets by mouth 2 (two) times daily. 100 tablet 3   No current facility-administered medications for this visit.   Facility-Administered Medications Ordered in Other Visits  Medication Dose Route Frequency Provider Last Rate Last Admin   anticoagulant sodium citrate solution 5 mL  5 mL Intracatheter Once Bertis Ruddy, Farron Watrous, MD       anticoagulant sodium citrate solution 5 mL  5 mL Intracatheter Once Bertis Ruddy, Nevaen Tredway, MD       diphenhydrAMINE (BENADRYL) capsule 25 mg  25 mg Oral Once Bertis Ruddy, Lakeshia Dohner, MD       LORazepam (ATIVAN) tablet 0.5 mg  0.5 mg Oral Once Saniyyah Elster, MD       sodium chloride flush (NS) 0.9 % injection 10 mL  10 mL Intracatheter Once Artis Delay, MD        SUMMARY OF ONCOLOGIC HISTORY: Oncology History Overview Note  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 Pan-endoscopy elsewhere showed cancer from nasopharyngeal space.   01/04/2007 - 02/20/2007 Chemotherapy   He received 2 cycles of cisplatin and 5FU followed by concurrent chemo with weekly cisplatin and radiation. He only received 2 doses of chemo due to severe mucositis, nausea and weight loss.   04/05/2007 - 08/04/2007 Chemotherapy   He received 4 more courses of cisplatin with 5FU and had complete response   07/05/2009 Procedure   Fine-needle aspirate of Christian right level II lymph nodes come from recurrent metastatic disease. Repeat endoscopy and CT scan show no evidence of disease elsewhere.   07/08/2009 -  12/02/2009 Chemotherapy   He was given 6 cycles of carboplatin, 5-FU and docetaxel   12/03/2009 Surgery   He has surgery to Christian residual lymph node on Christian right neck which showed no evidence of disease.   02/22/2012 Imaging   Repeat imaging study showed large recurrent mass. He was referred elsewhere for further treatment.   05/03/2012 Surgery   He underwent left upper lobectomy.   04/29/2013 Imaging   PEt scan showed lesion on right level II B and lower lung was abnormal   06/03/2013 - 02/02/2014 Chemotherapy   He had 6 cycles of chemotherapy when he was found to have recurrence of cancer and had received oxaliplatin and capecitabine   06/07/2014 Imaging   PET CT scan showed persistent disease in Christian right neck lymph nodes and left lung   06/29/2014 Procedure   Accession: [id number] repeat LUL biopsy confirmed metastatic cancer   07/18/2014 - 07/31/2014 Radiation Therapy   He received palliative radiation therapy to Christian lungs   10/10/2014 Imaging   CT scan of Christian chest, abdomen and pelvis  show regression in Christian size of Christian lung nodule in Christian left upper lobe and stable pulmonary nodules   01/24/2015 Imaging   CT scan showed stable disease in neck and lung   06/19/2015 Imaging   CT scan of Christian neck and Christian chest show possible mild progression of Christian nodule in Christian right side of Christian neck.   06/25/2015 Imaging   PET scan confirmed disease recurrence in Christian neck   07/07/2015 Imaging   [facility]   09/03/2015 - 08/26/2018 Chemotherapy   He received palliative chemo with Nivolumab   10/29/2015 Imaging   PET CT showed positive response to Rx   02/28/2016 Imaging   Ct abdomen showed abnormal thinkening in his stomach   03/03/2016 Imaging   CT: Right sternocleidomastoid muscle metastasis appears less distinct but otherwise not significantly changed in size or configuration since 06/19/2015.2. Left level 3 lymph node which was hypermetabolic by PET-CT in January 2017 appears slightly smaller   04/01/2016 Imaging   CT cervical spine showed no acute fracture or traumatic malalignment in Christian cervical spine   04/22/2016 Procedure   Port-a-cath placed.   06/16/2016 Imaging   Ct neck showed right sternocleidomastoid muscle metastasis is further decreased in conspicuity since May, and has mildly decreased in size since September 2016. Continued stability of sub-centimeter left cervical lymph nodes. No new or progressive metastatic disease in Christian neck.   06/16/2016 Imaging   CT chest showed stable masslike radiation fibrosis in Christian left upper lobe. Stable subcentimeter pulmonary nodules in Christian bilateral lower lobes. No new or progressive metastatic disease in Christian chest. Nonobstructing left renal stone.   10/13/2016 Imaging   Ct neck showed unchanged right sternocleidomastoid muscle metastasis. Unchanged subcentimeter left cervical lymph nodes. No evidence of new or progressive metastatic disease in Christian neck.   10/13/2016 Imaging   CT chest showed tiny hypervascular foci in Christian  liver, not definitely seen on prior imaging of 06/16/2016 and 02/28/2016. Abdomen MRI without and with contrast recommended to further evaluate as metastatic disease is a concern. 2. Stable appearance of post treatment changes left upper lung and scattered tiny bilateral pulmonary nodules.   02/11/2017 Imaging   Ct neck: Lymph node mass right posterior neck appears improved from Christian prior study. Small posterior lymph nodes on Christian left unchanged. Occluded right jugular vein unchanged.   02/11/2017 Imaging   1. Similar appearance of  postsurgical and radiation changes in Christian left upper lobe. 2. Similar bilateral pulmonary nodules. 3. No thoracic adenopathy. 4. Subtle foci of post-contrast enhancement within Christian liver are suboptimally characterized on this nondedicated study. Likely similar. These could either be re-evaluated at followup or more entirely characterized with abdominal MRI. 5. Left nephrolithiasis.   05/19/2017 Imaging   Matted lymph node mass right posterior neck appears larger in Christian recent CT. Accurate measurements difficult due to infiltrating tumor margins and infiltration of Christian muscle. Right jugular vein again appears occluded or resected. Small left posterior lymph nodes stable. Left upper lobe airspace density stable and similar to Christian prior CT   06/03/2017 PET scan   1. Hypermetabolic ill-defined right level IIb lymph node, about 1.3 cm in diameter with maximum SUV 9.5 (formerly 8.1). Appearance suspicious for residual/recurrent malignancy. No worrisome left-sided lesion. 2. Left suprahilar indistinct opacity demonstrates no worrisome hypermetabolic activity. Christian 5 mm left lower lobe pulmonary nodule is stable and not currently hypermetabolic although below sensitive PET-CT size thresholds. 3. Other imaging findings of potential clinical significance: Bilateral nonobstructive nephrolithiasis. Chronic bilateral maxillary sinusitis.   05/11/2018 PET scan   1. Continued chronic  accentuated metabolic activity in Christian vicinity of right level IIB and Christian adjacent right sternocleidomastoid muscle, with ill definition of surrounding tissue planes. Maximum SUV is currently 8.1, formerly 9.5. Accentuated metabolic activity is been present in this vicinity back through 06/25/2015, and there was also some low-level activity in this vicinity on 06/07/2014. Some of this may be from scarring and local muscular activity although clearly a component of residual tumor is difficult to exclude given Christian focally high activity. 2. Other imaging findings of potential clinical significance: Chronic bilateral maxillary sinusitis. Chronic scarring in Christian left upper lobe. Chronically stable 5 mm left lower lobe nodule is considered benign. Nonobstructive left nephrolithiasis.   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 COMMENT:No significant keratinization is identified. Other basaloid carcinomas are in Christian differential diagnosis. No cell block material is available for further testing.   09/12/2018 Procedure   He underwent fine Needle Aspiration   10/04/2018 PET scan   1. Significant progression of local recurrence laterally in Christian mid right neck with an enlarging, increasingly hypermetabolic soft tissue mass. This involves Christian right sternocleidomastoid muscle. 2. Small lymph nodes in Christian right axilla are increasingly hypermetabolic. These are nonspecific and potentially reactive, although could reflect a small metastases. Small hypermetabolic nodule in Christian left suprasternal notch is unchanged. 3. No other evidence of metastatic disease.     10/07/2018 - 12/23/2018 Chemotherapy   Christian Simmons had cisplatin plus gemzar   12/07/2018 Imaging   1. Decreased size of lateral right neck mass. 2. Unchanged soft tissue nodule in Christian suprasternal notch. 3. No  evidence of new metastatic disease in Christian neck.     05/30/2019 Imaging   CT neck No clear change or progression compared to Christian study of March. Overall measurements of Christian right lateral neck mass are similar, approximately 3 x 1.8 cm. See above discussion. One could argue that there is slight increase in lateral bulging, possibly with an increase in contrast enhancement, towards Christian inferior margin. This is of questionable validity but could possibly represent some progression or inflammatory change. Other findings in Christian region are stable.   07/20/2019 - 09/15/2019 Chemotherapy   Christian Simmons had gemzar and  cisplatin   10/02/2019 Imaging   CT neck As compared to 05/30/2019, no significant interval change in size of an ill-defined mass within Christian right lateral neck, again measuring 3.3 x 1.8 cm in transaxial dimensions.   Unchanged mildly enlarged left level I lymph node measuring 1.1 cm in short axis.   Unchanged node or nodule at Christian thoracic inlet, measuring 1.3 x 0.8 cm.   Please refer to concurrently performed chest CT for a description of findings below Christian level of Christian thoracic inlet.     10/02/2019 Imaging   CT chest 1. No new or progressive findings in Christian chest to suggest metastatic disease. 2. Bilateral subcentimeter solid pulmonary nodules are stable since 2018. 3. Hyperdense 1.1 cm anterior liver focus, not clearly visualized on prior studies. Suggest MRI abdomen without and with IV contrast for further characterization.   10/20/2019 - 12/29/2019 Chemotherapy   Christian Simmons had gemzar maintenance   06/12/2020 Imaging   1. Enlarging superficial, exophytic component of Christian chronic right sternocleidomastoid muscle mass. See series 6, image 55. 2. Elsewhere stable CT appearance of Christian Neck.   06/12/2020 Imaging   Post treatment scarring in Christian left hemithorax, stable. No evidence recurrent or metastatic disease   06/14/2020 - 10/15/2020 Chemotherapy   He received carboplatin, 5FU  and Rande Lawman       10/31/2020 Procedure   Interval improvement in right lateral lymph node mass. Improvement in dermal component as well as invasion of Christian right sternocleidomastoid muscle.   10 mm submental lymph node slightly enlarged compared to Christian prior study. Continued follow-up recommended.   11/01/2020 - 05/22/2022 Chemotherapy   Simmons is on Treatment Plan : HEAD/NECK Pembrolizumab Q21D     07/03/2022 - 10/30/2022 Chemotherapy   Simmons is on Treatment Plan : Head and neck Pembrolizumab (400) q42d     11/30/2022 Imaging   CT chest  1. Stable exam. No new or progressive findings to suggest recurrent or metastatic disease. 2. Stable tiny bilateral nodules since 06/12/2020, consistent with benign etiology. 3. Punctate nonobstructing left renal stone.   12/01/2022 Imaging   CT neck  Growing right neck mass since 2022 with epidural tumor extension via Christian right C2-3 foramen. Cervical MRI with contrast would be contributory.   12/11/2022 - 04/02/2023 Chemotherapy   Simmons is on Treatment Plan : HEAD/NECK toripalimab-tpzi D1 + cisplatin D1 + gemcitabine D1,8 q21d x 6 cycles / toripalimab-tpzi q21d (up to 23m)     04/15/2023 Imaging   CT Chest W Contrast  Result Date: 04/15/2023 CLINICAL DATA:  Right neck mass, nasopharyngeal carcinoma EXAM: CT CHEST WITH CONTRAST TECHNIQUE: Multidetector CT imaging of Christian chest was performed during intravenous contrast administration. RADIATION DOSE REDUCTION: This exam was performed according to Christian departmental dose-optimization program which includes automated exposure control, adjustment of the mA and/or kV according to Simmons size and/or use of iterative reconstruction technique. CONTRAST:  75mL OMNIPAQUE IOHEXOL 300 MG/ML  SOLN COMPARISON:  11/27/2022 FINDINGS: Cardiovascular: Right Port-A-Cath tip: Right atrium. Mediastinum/Nodes: Small left supraclavicular lymph node 0.5 cm in short axis on image 4 series 3, formerly Christian same. No pathologic  adenopathy in Christian chest. Lungs/Pleura: Biapical pleuroparenchymal scarring, right greater than left. Stable left upper lobe scarring and peribronchovascular density with wedge resection line in Christian left upper lobe, no significant contour change of density in this vicinity to suggest malignancy. Stable 5 by 4 mm left lower lobe nodule and stable 3 by 4 mm right lower lobe nodule, unchanged from at least 2021,  compatible with benign etiology. No new lesion identified. Upper Abdomen: 2 mm left kidney upper pole nonobstructive renal calculus, image 43 series 7. Musculoskeletal: Unremarkable IMPRESSION: 1. Stable postoperative findings in Christian left upper lobe. 2. Stable small bilateral lower lobe pulmonary nodules, unchanged from at least 2021, compatible with benign etiology. 3. 2 mm left kidney upper pole nonobstructive renal calculus. Electronically Signed   By: Gaylyn Rong M.D.   On: 04/15/2023 14:03   CT Soft Tissue Neck W Contrast  Result Date: 04/15/2023 CLINICAL DATA:  Head/neck cancer.  Last chemo 04/02/2023. EXAM: CT NECK WITH CONTRAST TECHNIQUE: Multidetector CT imaging of Christian neck was performed using Christian standard protocol following Christian bolus administration of intravenous contrast. RADIATION DOSE REDUCTION: This exam was performed according to Christian departmental dose-optimization program which includes automated exposure control, adjustment of the mA and/or kV according to Simmons size and/or use of iterative reconstruction technique. CONTRAST:  75mL OMNIPAQUE IOHEXOL 300 MG/ML  SOLN COMPARISON:  CT neck 11/27/2022. FINDINGS: Pharynx and larynx: There is probable mucoid debris in Christian right nasal cavity/nasopharynx. Christian nasal cavity and nasopharynx are otherwise unremarkable. Christian oral cavity and oropharynx are unremarkable. Christian parapharyngeal spaces are clear. Christian hypopharynx and larynx are unremarkable. Christian epiglottis is normal. There is no retropharyngeal collection. Christian airway is patent. Salivary  glands: Atrophy of Christian parotid and left submandibular glands is unchanged. Christian right submandibular gland is again not identified and may be surgically absent. Thyroid: Unremarkable. Lymph nodes: Christian infiltrative mass in Christian right neck is again seen. Christian mass invades through Christian right C2-C3 neural foramen into Christian epidural space resulting in mild narrowing of Christian thecal sac without suspected cord compression. Christian degree of intraspinal epidural tumor appears slightly increased compared to Christian study from 11/27/2022. Mass again encases Christian vertebral artery at Christian C2-C3 level (4-36), unchanged. Christian remainder of Christian infiltrative mass in Christian paraspinal musculature with extension into Christian overlying skin surface does not appear significantly changed in size or extent compared to Christian prior study. A 0.9 cm midline level I lymph node is unchanged going back to 2021. Otherwise, there is no new or progressive lymphadenopathy in Christian neck. Vascular: As above, there is encasement of Christian right vertebral artery at Christian C2-C3 level by Christian right neck mass. There is mild calcified plaque at Christian carotid bifurcations. Christian right IJ is occluded or sacrificed, unchanged. Christian left IJ is patent. A right chest wall port is partially imaged. Limited intracranial: Unremarkable. Visualized orbits: Unremarkable. Mastoids and visualized paranasal sinuses: There is mild mucosal thickening in Christian maxillary sinuses. Christian mastoid air cells and middle ear cavities are clear. Skeleton: There is no acute osseous abnormality or suspicious osseous lesion. Upper chest: Assessed on Christian separately dictated CT chest. Other: None. IMPRESSION: 1. Infiltrative mass again seen in Christian right neck centered in Christian paraspinal musculature with invasion through Christian right C2-C3 neural foramen. Christian bulk of Christian epidural tumor at this level appears slightly increased in size compared to Christian prior study from 11/27/2022 with probable leftward displacement of Christian cord but without  frank cord compression. Christian remainder of Christian mass in Christian right neck is otherwise not significantly changed. 2. No new or progressive lymphadenopathy in Christian remainder of Christian neck. 3. Probable mucoid debris in Christian right nasal cavity/nasopharynx. No suspicious mass lesion or enhancement in Christian nasopharynx. Electronically Signed   By: Lesia Hausen M.D.   On: 04/15/2023 14:02      05/07/2023 -  Chemotherapy   Simmons  is on Treatment Plan : HEAD/NECK Cetuximab q14d + Carboplatin + 5FU IVCI D1-4 q21d x 6 cycles / Cetuximab q14d       PHYSICAL EXAMINATION: ECOG PERFORMANCE STATUS: 1 - Symptomatic but completely ambulatory  Vitals:   06/04/23 1116  BP: 130/83  Pulse: 88  Resp: 18  Temp: 98.3 F (36.8 C)  SpO2: 100%   Filed Weights   06/04/23 1116  Weight: 184 lb 12.8 oz (83.8 kg)    GENERAL:alert, no distress and comfortable SKIN: skin color, texture, turgor are normal, no rashes or significant lesions EYES: normal, Conjunctiva are pink and non-injected, sclera clear OROPHARYNX: He has signs of mucositis.  No thrush.  No bleeding NECK: Previous neck mass has resolved  LABORATORY DATA:  I have reviewed Christian data as listed    Component Value Date/Time   NA 136 06/04/2023 1045   NA 139 09/22/2017 0829   K 3.6 06/04/2023 1045   K 3.5 09/22/2017 0829   CL 100 06/04/2023 1045   CO2 30 06/04/2023 1045   CO2 26 09/22/2017 0829   GLUCOSE 196 (H) 06/04/2023 1045   GLUCOSE 133 09/22/2017 0829   BUN 15 06/04/2023 1045   BUN 14.1 09/22/2017 0829   CREATININE 0.82 06/04/2023 1045   CREATININE 0.9 09/22/2017 0829   CALCIUM 8.6 (L) 06/04/2023 1045   CALCIUM 9.1 09/22/2017 0829   PROT 6.9 05/28/2023 1025   PROT 6.8 09/22/2017 0829   ALBUMIN 4.0 05/28/2023 1025   ALBUMIN 4.1 09/22/2017 0829   AST 23 05/28/2023 1025   AST 22 09/22/2017 0829   ALT 23 05/28/2023 1025   ALT 30 09/22/2017 0829   ALKPHOS 93 05/28/2023 1025   ALKPHOS 55 09/22/2017 0829   BILITOT 0.3 05/28/2023 1025   BILITOT  0.35 09/22/2017 0829   GFRNONAA >60 06/04/2023 1045   GFRAA >60 07/05/2020 0845   GFRAA >60 02/06/2019 1215    No results found for: "SPEP", "UPEP"  Lab Results  Component Value Date   WBC 3.3 (L) 06/04/2023   NEUTROABS 2.0 06/04/2023   HGB 11.0 (L) 06/04/2023   HCT 33.5 (L) 06/04/2023   MCV 86.8 06/04/2023   PLT 275 06/04/2023      Chemistry      Component Value Date/Time   NA 136 06/04/2023 1045   NA 139 09/22/2017 0829   K 3.6 06/04/2023 1045   K 3.5 09/22/2017 0829   CL 100 06/04/2023 1045   CO2 30 06/04/2023 1045   CO2 26 09/22/2017 0829   BUN 15 06/04/2023 1045   BUN 14.1 09/22/2017 0829   CREATININE 0.82 06/04/2023 1045   CREATININE 0.9 09/22/2017 0829      Component Value Date/Time   CALCIUM 8.6 (L) 06/04/2023 1045   CALCIUM 9.1 09/22/2017 0829   ALKPHOS 93 05/28/2023 1025   ALKPHOS 55 09/22/2017 0829   AST 23 05/28/2023 1025   AST 22 09/22/2017 0829   ALT 23 05/28/2023 1025   ALT 30 09/22/2017 0829   BILITOT 0.3 05/28/2023 1025   BILITOT 0.35 09/22/2017 0829

## 2023-06-04 NOTE — Progress Notes (Signed)
Pt became very anxious after receiving compazine IV.  He couldn't sit still. Walking the floor.  Order from Dr Bertis Ruddy for ativan but pt states benadryl helped when he was In the hospital & he felt this way.  Received order for benadryl but before this nurse could get drug, pt states he doesn't want any medicine or any more IVF & just wants to go home to his wife.  Informed MD.  Pt received 1/2 of IVF.  D/c to home.

## 2023-06-04 NOTE — Assessment & Plan Note (Signed)
He has significant signs of mucositis from treatment I recommend holding chemotherapy today I recommend Magic mouthwash and IV fluid support and he agrees

## 2023-06-04 NOTE — Assessment & Plan Note (Signed)
The patient has an amazing response.  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  He is very happy with the positive response to chemotherapy Unfortunately, he had nausea and signs of mucositis I recommend we hold his cetuximab treatment today and keep his appointment as scheduled in 2 weeks and focus on supportive care He is in agreement

## 2023-06-04 NOTE — Assessment & Plan Note (Signed)
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

## 2023-06-07 ENCOUNTER — Other Ambulatory Visit: Payer: Self-pay

## 2023-06-08 ENCOUNTER — Other Ambulatory Visit (HOSPITAL_BASED_OUTPATIENT_CLINIC_OR_DEPARTMENT_OTHER): Payer: Self-pay

## 2023-06-09 ENCOUNTER — Other Ambulatory Visit (HOSPITAL_BASED_OUTPATIENT_CLINIC_OR_DEPARTMENT_OTHER): Payer: Self-pay

## 2023-06-10 ENCOUNTER — Other Ambulatory Visit (HOSPITAL_BASED_OUTPATIENT_CLINIC_OR_DEPARTMENT_OTHER): Payer: Self-pay

## 2023-06-11 ENCOUNTER — Other Ambulatory Visit (HOSPITAL_BASED_OUTPATIENT_CLINIC_OR_DEPARTMENT_OTHER): Payer: Self-pay

## 2023-06-15 ENCOUNTER — Other Ambulatory Visit (HOSPITAL_BASED_OUTPATIENT_CLINIC_OR_DEPARTMENT_OTHER): Payer: Self-pay

## 2023-06-17 MED FILL — Fosaprepitant Dimeglumine For IV Infusion 150 MG (Base Eq): INTRAVENOUS | Qty: 5 | Status: AC

## 2023-06-17 MED FILL — Dexamethasone Sodium Phosphate Inj 100 MG/10ML: INTRAMUSCULAR | Qty: 1 | Status: AC

## 2023-06-18 ENCOUNTER — Other Ambulatory Visit (HOSPITAL_COMMUNITY): Payer: Self-pay

## 2023-06-18 ENCOUNTER — Encounter: Payer: Self-pay | Admitting: Hematology and Oncology

## 2023-06-18 ENCOUNTER — Inpatient Hospital Stay: Payer: Medicaid Other | Attending: Hematology and Oncology

## 2023-06-18 ENCOUNTER — Inpatient Hospital Stay: Payer: Medicaid Other

## 2023-06-18 ENCOUNTER — Inpatient Hospital Stay (HOSPITAL_BASED_OUTPATIENT_CLINIC_OR_DEPARTMENT_OTHER): Payer: Medicaid Other | Admitting: Hematology and Oncology

## 2023-06-18 VITALS — BP 115/66 | HR 97 | Resp 18 | Ht 66.0 in | Wt 185.8 lb

## 2023-06-18 VITALS — Temp 98.7°F

## 2023-06-18 DIAGNOSIS — K5909 Other constipation: Secondary | ICD-10-CM

## 2023-06-18 DIAGNOSIS — Z5111 Encounter for antineoplastic chemotherapy: Secondary | ICD-10-CM | POA: Insufficient documentation

## 2023-06-18 DIAGNOSIS — Z95828 Presence of other vascular implants and grafts: Secondary | ICD-10-CM

## 2023-06-18 DIAGNOSIS — C760 Malignant neoplasm of head, face and neck: Secondary | ICD-10-CM

## 2023-06-18 DIAGNOSIS — Z7189 Other specified counseling: Secondary | ICD-10-CM

## 2023-06-18 DIAGNOSIS — C119 Malignant neoplasm of nasopharynx, unspecified: Secondary | ICD-10-CM | POA: Diagnosis present

## 2023-06-18 DIAGNOSIS — D61818 Other pancytopenia: Secondary | ICD-10-CM | POA: Diagnosis not present

## 2023-06-18 DIAGNOSIS — Z5112 Encounter for antineoplastic immunotherapy: Secondary | ICD-10-CM | POA: Diagnosis present

## 2023-06-18 DIAGNOSIS — C7800 Secondary malignant neoplasm of unspecified lung: Secondary | ICD-10-CM

## 2023-06-18 DIAGNOSIS — L270 Generalized skin eruption due to drugs and medicaments taken internally: Secondary | ICD-10-CM

## 2023-06-18 DIAGNOSIS — C7989 Secondary malignant neoplasm of other specified sites: Secondary | ICD-10-CM | POA: Diagnosis not present

## 2023-06-18 DIAGNOSIS — Z79899 Other long term (current) drug therapy: Secondary | ICD-10-CM | POA: Diagnosis not present

## 2023-06-18 LAB — CBC WITH DIFFERENTIAL (CANCER CENTER ONLY)
Abs Immature Granulocytes: 0.02 10*3/uL (ref 0.00–0.07)
Basophils Absolute: 0 10*3/uL (ref 0.0–0.1)
Basophils Relative: 0 %
Eosinophils Absolute: 0 10*3/uL (ref 0.0–0.5)
Eosinophils Relative: 1 %
HCT: 33.1 % — ABNORMAL LOW (ref 39.0–52.0)
Hemoglobin: 10.5 g/dL — ABNORMAL LOW (ref 13.0–17.0)
Immature Granulocytes: 1 %
Lymphocytes Relative: 40 %
Lymphs Abs: 1.3 10*3/uL (ref 0.7–4.0)
MCH: 28.5 pg (ref 26.0–34.0)
MCHC: 31.7 g/dL (ref 30.0–36.0)
MCV: 89.7 fL (ref 80.0–100.0)
Monocytes Absolute: 0.5 10*3/uL (ref 0.1–1.0)
Monocytes Relative: 17 %
Neutro Abs: 1.3 10*3/uL — ABNORMAL LOW (ref 1.7–7.7)
Neutrophils Relative %: 41 %
Platelet Count: 192 10*3/uL (ref 150–400)
RBC: 3.69 MIL/uL — ABNORMAL LOW (ref 4.22–5.81)
RDW: 17.2 % — ABNORMAL HIGH (ref 11.5–15.5)
WBC Count: 3.1 10*3/uL — ABNORMAL LOW (ref 4.0–10.5)
nRBC: 0 % (ref 0.0–0.2)

## 2023-06-18 LAB — CMP (CANCER CENTER ONLY)
ALT: 25 U/L (ref 0–44)
AST: 24 U/L (ref 15–41)
Albumin: 4.1 g/dL (ref 3.5–5.0)
Alkaline Phosphatase: 85 U/L (ref 38–126)
Anion gap: 7 (ref 5–15)
BUN: 11 mg/dL (ref 6–20)
CO2: 30 mmol/L (ref 22–32)
Calcium: 9.1 mg/dL (ref 8.9–10.3)
Chloride: 100 mmol/L (ref 98–111)
Creatinine: 0.86 mg/dL (ref 0.61–1.24)
GFR, Estimated: >60 mL/min (ref >60)
Glucose, Bld: 144 mg/dL — ABNORMAL HIGH (ref 70–99)
Potassium: 3.7 mmol/L (ref 3.5–5.1)
Sodium: 137 mmol/L (ref 135–145)
Total Bilirubin: 0.4 mg/dL (ref 0.3–1.2)
Total Protein: 7.1 g/dL (ref 6.5–8.1)

## 2023-06-18 LAB — MAGNESIUM: Magnesium: 1.8 mg/dL (ref 1.7–2.4)

## 2023-06-18 MED ORDER — BISACODYL 10 MG RE SUPP
10.0000 mg | RECTAL | 0 refills | Status: DC | PRN
  Filled 2023-06-18: qty 4, 4d supply, fill #0
  Filled 2023-06-18: qty 12, fill #0

## 2023-06-18 MED ORDER — SODIUM CHLORIDE 0.9 % IV SOLN
750.0000 mg | Freq: Once | INTRAVENOUS | Status: AC
  Administered 2023-06-18: 750 mg via INTRAVENOUS
  Filled 2023-06-18: qty 73.62

## 2023-06-18 MED ORDER — FAMOTIDINE IN NACL 20-0.9 MG/50ML-% IV SOLN
20.0000 mg | Freq: Once | INTRAVENOUS | Status: AC
  Administered 2023-06-18: 20 mg via INTRAVENOUS
  Filled 2023-06-18: qty 50

## 2023-06-18 MED ORDER — LORAZEPAM 0.5 MG PO TABS
0.5000 mg | ORAL_TABLET | Freq: Two times a day (BID) | ORAL | 0 refills | Status: DC | PRN
  Filled 2023-06-18: qty 60, 30d supply, fill #0

## 2023-06-18 MED ORDER — SODIUM CHLORIDE 0.9 % IV SOLN: Freq: Once | INTRAVENOUS | Status: AC

## 2023-06-18 MED ORDER — PALONOSETRON HCL INJECTION 0.25 MG/5ML
0.2500 mg | Freq: Once | INTRAVENOUS | Status: AC
  Administered 2023-06-18: 0.25 mg via INTRAVENOUS
  Filled 2023-06-18: qty 5

## 2023-06-18 MED ORDER — SODIUM CHLORIDE 0.9 % IV SOLN
10.0000 mg | Freq: Once | INTRAVENOUS | Status: AC
  Administered 2023-06-18: 10 mg via INTRAVENOUS
  Filled 2023-06-18: qty 10

## 2023-06-18 MED ORDER — SODIUM CHLORIDE 0.9 % IV SOLN
800.0000 mg/m2/d | INTRAVENOUS | Status: DC
  Administered 2023-06-18: 7000 mg via INTRAVENOUS
  Filled 2023-06-18: qty 140

## 2023-06-18 MED ORDER — SODIUM CHLORIDE 0.9 % IV SOLN
150.0000 mg | Freq: Once | INTRAVENOUS | Status: AC
  Administered 2023-06-18: 150 mg via INTRAVENOUS
  Filled 2023-06-18: qty 150

## 2023-06-18 MED ORDER — SODIUM CHLORIDE 0.9% FLUSH
10.0000 mL | INTRAVENOUS | Status: DC | PRN
  Administered 2023-06-18: 10 mL

## 2023-06-18 MED ORDER — DIPHENHYDRAMINE HCL 50 MG/ML IJ SOLN
50.0000 mg | Freq: Once | INTRAMUSCULAR | Status: AC
  Administered 2023-06-18: 50 mg via INTRAVENOUS
  Filled 2023-06-18: qty 1

## 2023-06-18 MED ORDER — EMPTY CONTAINERS FLEXIBLE MISC
400.0000 mg/m2 | Freq: Once | Status: AC
  Administered 2023-06-18: 800 mg via INTRAVENOUS
  Filled 2023-06-18: qty 400

## 2023-06-18 MED ORDER — SODIUM CHLORIDE 0.9% FLUSH
10.0000 mL | Freq: Once | INTRAVENOUS | Status: AC
  Administered 2023-06-18: 10 mL

## 2023-06-18 NOTE — Assessment & Plan Note (Signed)
This is improved This is related to cetuximab and he will continue current treatment

## 2023-06-18 NOTE — Progress Notes (Signed)
[facility] OFFICE PROGRESS NOTE  Patient Care Team: Artis Delay, MD as PCP - General (Hematology and Oncology) Artis Delay, MD as Consulting Physician (Hematology and Oncology) Elfredia Nevins, MD as Referring Physician (Plastic Surgery) Hilarie Fredrickson, MD as Consulting Physician (Gastroenterology)  ASSESSMENT & PLAN:  Malignant neoplasm of head and neck [facility]) The patient has an amazing response.  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  He is very happy with the positive response to chemotherapy We discontinue his recent treatment to allow recovery from mucositis. That has resolved Will proceed with treatment with minor dose adjustment due to mild leukopenia and recent severe mucositis  Pancytopenia, acquired (HCC) He has mild persistent pancytopenia from prior treatment He is not symptomatic Observe closely for now  Other constipation He has severe constipation I recommend 1 dose of suppository due to no bowel movement for few days I reinforced the importance of regular laxative therapy  Drug-induced skin rash This is improved This is related to cetuximab and he will continue current treatment  Goals of care, counseling/discussion The patient is having difficulties coping with side effects of treatment I am hopeful we can discontinue his treatment after only 4 cycles of therapy next month I refilled his prescription of lorazepam  No orders of the defined types were placed in this encounter.   All questions were answered. The patient knows to call the clinic with any problems, questions or concerns. The total time spent in the appointment was 40 minutes encounter with patients including review of chart and various tests results, discussions about plan of care and coordination of care plan   Artis Delay, MD 06/18/2023 12:01 PM  INTERVAL HISTORY: Please see below for problem oriented charting. he returns for treatment follow-up He has severe constipation  with no bowel movement in 3 days He also have significant anxiety and difficulties with coping His pain is better Mucositis has resolved  REVIEW OF SYSTEMS:   Constitutional: Denies fevers, chills or abnormal weight loss Eyes: Denies blurriness of vision Respiratory: Denies cough, dyspnea or wheezes Cardiovascular: Denies palpitation, chest discomfort or lower extremity swelling Lymphatics: Denies new lymphadenopathy or easy bruising Neurological:Denies numbness, tingling or new weaknesses All other systems were reviewed with the patient and are negative.  I have reviewed the past medical history, past surgical history, social history and family history with the patient and they are unchanged from previous note.  ALLERGIES:  is allergic to phenergan [promethazine hcl], heparin, and clindamycin.  MEDICATIONS:  Current Outpatient Medications  Medication Sig Dispense Refill   bisacodyl (DULCOLAX) 10 MG suppository Place 1 suppository (10 mg total) rectally as needed for moderate constipation. 4 suppository 0   blood glucose meter kit and supplies KIT Dispense based on patient and insurance preference. Use up to four times daily as directed. (FOR ICD-9 250.00, 250.01). 1 each 1   cetirizine (ZYRTEC) 10 MG tablet Take 1 tablet (10 mg total) by mouth daily as needed for allergies. 100 tablet 0   dexamethasone (DECADRON) 4 MG tablet Take 1 tablet (4 mg total) by mouth daily for 3 days after chemo, every 2 weeks. 30 tablet 0   famotidine (PEPCID) 40 MG tablet Take 1 tablet (40 mg total) by mouth at bedtime. 30 tablet 3   levothyroxine (SYNTHROID) 175 MCG tablet Take 1 tablet (175 mcg total) by mouth daily before breakfast. 30 tablet 3   LORazepam (ATIVAN) 0.5 MG tablet Take 1 tablet (0.5 mg total) by mouth 2 (two) times daily  as needed for anxiety. 60 tablet 0   magic mouthwash w/lidocaine SOLN Take 5 mLs by mouth 4 (four) times daily. 5 mls 4 x a day for 10 days, swish and spit. 240 mL 0    metFORMIN (GLUCOPHAGE) 500 MG tablet Take 1 tablet (500 mg total) by mouth 2 (two) times daily with a meal. 60 tablet 2   methadone (DOLOPHINE) 10 MG tablet Take 1 tablet (10 mg) by mouth every 8 hours. 90 tablet 0   metroNIDAZOLE (METROGEL) 1 % gel Apply topically 2 (two) times daily as needed. 60 g 1   minocycline (MINOCIN) 100 MG capsule Take 1 capsule (100 mg total) by mouth 2 (two) times daily. 60 capsule 1   ondansetron (ZOFRAN) 8 MG tablet Take 1 tablet (8 mg total) by mouth every 8 (eight) hours as needed for nausea or vomiting. 90 tablet 1   oxyCODONE 10 MG TABS Take 1 tablet (10 mg total) by mouth every 4 (four) hours as needed.     pantoprazole (PROTONIX) 40 MG tablet Take 1 tablet (40 mg total) by mouth daily. 30 tablet 2   polyethylene glycol (MIRALAX / GLYCOLAX) 17 g packet Take 17 g by mouth 2 (two) times daily.     prochlorperazine (COMPAZINE) 10 MG tablet Take 1 tablet (10 mg total) by mouth every 6 (six) hours as needed for nausea or vomiting. 30 tablet 2   senna-docusate (SENOKOT-S) 8.6-50 MG tablet Take 2 tablets by mouth 2 (two) times daily. 100 tablet 3   No current facility-administered medications for this visit.   Facility-Administered Medications Ordered in Other Visits  Medication Dose Route Frequency Provider Last Rate Last Admin   anticoagulant sodium citrate solution 5 mL  5 mL Intracatheter Once Bertis Ruddy, Ekansh Sherk, MD       anticoagulant sodium citrate solution 5 mL  5 mL Intracatheter Once Bertis Ruddy, Tavie Haseman, MD       CARBOplatin (PARAPLATIN) 750 mg in sodium chloride 0.9 % 250 mL chemo infusion  750 mg Intravenous Once Bertis Ruddy, Monigue Spraggins, MD       cetuximab (ERBITUX) chemo infusion 800 mg  400 mg/m2 (Treatment Plan Recorded) Intravenous Once Bertis Ruddy, Itzamara Casas, MD       dexamethasone (DECADRON) 10 mg in sodium chloride 0.9 % 50 mL IVPB  10 mg Intravenous Once Bertis Ruddy, Kortnee Bas, MD       famotidine (PEPCID) IVPB 20 mg premix  20 mg Intravenous Once Bertis Ruddy, Shashank Kwasnik, MD       fluorouracil (ADRUCIL)  7,000 mg in sodium chloride 0.9 % 110 mL chemo infusion  800 mg/m2/day (Treatment Plan Recorded) Intravenous 4 days Bertis Ruddy, Aleczander Fandino, MD       fosaprepitant (EMEND) 150 mg in sodium chloride 0.9 % 145 mL IVPB  150 mg Intravenous Once Raekwon Winkowski, MD       sodium chloride flush (NS) 0.9 % injection 10 mL  10 mL Intracatheter Once Bertis Ruddy, Johnni Wunschel, MD       sodium chloride flush (NS) 0.9 % injection 10 mL  10 mL Intracatheter PRN Artis Delay, MD        SUMMARY OF ONCOLOGIC HISTORY: Oncology History Overview Note  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 Pan-endoscopy elsewhere showed cancer from nasopharyngeal space.   01/04/2007 - 02/20/2007 Chemotherapy   He received 2 cycles of cisplatin and 5FU followed by concurrent chemo with weekly cisplatin and radiation. He only received 2 doses of chemo due to severe mucositis, nausea and weight loss.   04/05/2007 - 08/04/2007 Chemotherapy   He received 4 more courses of cisplatin with 5FU and had complete response   07/05/2009 Procedure   Fine-needle aspirate of the right level II lymph nodes come from recurrent metastatic disease. Repeat endoscopy and CT scan show no evidence of disease elsewhere.   07/08/2009 - 12/02/2009 Chemotherapy   He was given 6 cycles of carboplatin, 5-FU and docetaxel   12/03/2009 Surgery   He has surgery to the residual lymph node on the right neck which showed no evidence of disease.   02/22/2012 Imaging   Repeat imaging study showed  large recurrent mass. He was referred elsewhere for further treatment.   05/03/2012 Surgery   He underwent left upper lobectomy.   04/29/2013 Imaging   PEt scan showed lesion on right level II B and lower lung was abnormal   06/03/2013 - 02/02/2014 Chemotherapy   He had 6 cycles of chemotherapy when he was found to have recurrence of cancer and had received oxaliplatin and capecitabine   06/07/2014 Imaging   PET CT scan showed persistent disease in the right neck lymph nodes and left lung   06/29/2014 Procedure   Accession: [id number] repeat LUL biopsy confirmed metastatic cancer   07/18/2014 - 07/31/2014 Radiation Therapy   He received palliative radiation therapy to the lungs   10/10/2014 Imaging   CT scan of the chest, abdomen and pelvis show regression in the size of the lung nodule in the left upper lobe and stable pulmonary nodules   01/24/2015 Imaging   CT scan showed stable disease in neck and lung   06/19/2015 Imaging   CT scan of the neck and the chest show possible mild progression of the nodule in the right side of the neck.   06/25/2015 Imaging   PET scan confirmed disease recurrence in the neck   07/07/2015 Imaging   [facility]   09/03/2015 - 08/26/2018 Chemotherapy   He received palliative chemo with Nivolumab   10/29/2015 Imaging   PET CT showed positive response to Rx   02/28/2016 Imaging   Ct abdomen showed abnormal thinkening in his stomach   03/03/2016 Imaging   CT: Right sternocleidomastoid muscle metastasis appears less distinct but otherwise not significantly changed in size or configuration since 06/19/2015.2. Left level 3 lymph node which was hypermetabolic by PET-CT in January 2017 appears slightly smaller   04/01/2016 Imaging   CT cervical spine showed no acute fracture or traumatic malalignment in the cervical spine   04/22/2016 Procedure   Port-a-cath placed.   06/16/2016 Imaging   Ct neck showed right sternocleidomastoid muscle metastasis  is further decreased in conspicuity since May, and has mildly decreased in size since September 2016. Continued stability of sub-centimeter left cervical lymph nodes. No new or progressive metastatic disease in the neck.   06/16/2016 Imaging   CT chest showed stable masslike radiation fibrosis in the left upper lobe. Stable subcentimeter pulmonary nodules in the bilateral lower lobes. No new or progressive metastatic  disease in the chest. Nonobstructing left renal stone.   10/13/2016 Imaging   Ct neck showed unchanged right sternocleidomastoid muscle metastasis. Unchanged subcentimeter left cervical lymph nodes. No evidence of new or progressive metastatic disease in the neck.   10/13/2016 Imaging   CT chest showed tiny hypervascular foci in the liver, not definitely seen on prior imaging of 06/16/2016 and 02/28/2016. Abdomen MRI without and with contrast recommended to further evaluate as metastatic disease is a concern. 2. Stable appearance of post treatment changes left upper lung and scattered tiny bilateral pulmonary nodules.   02/11/2017 Imaging   Ct neck: Lymph node mass right posterior neck appears improved from the prior study. Small posterior lymph nodes on the left unchanged. Occluded right jugular vein unchanged.   02/11/2017 Imaging   1. Similar appearance of postsurgical and radiation changes in the left upper lobe. 2. Similar bilateral pulmonary nodules. 3. No thoracic adenopathy. 4. Subtle foci of post-contrast enhancement within the liver are suboptimally characterized on this nondedicated study. Likely similar. These could either be re-evaluated at followup or more entirely characterized with abdominal MRI. 5. Left nephrolithiasis.   05/19/2017 Imaging   Matted lymph node mass right posterior neck appears larger in the recent CT. Accurate measurements difficult due to infiltrating tumor margins and infiltration of the muscle. Right jugular vein again appears occluded or resected.  Small left posterior lymph nodes stable. Left upper lobe airspace density stable and similar to the prior CT   06/03/2017 PET scan   1. Hypermetabolic ill-defined right level IIb lymph node, about 1.3 cm in diameter with maximum SUV 9.5 (formerly 8.1). Appearance suspicious for residual/recurrent malignancy. No worrisome left-sided lesion. 2. Left suprahilar indistinct opacity demonstrates no worrisome hypermetabolic activity. The 5 mm left lower lobe pulmonary nodule is stable and not currently hypermetabolic although below sensitive PET-CT size thresholds. 3. Other imaging findings of potential clinical significance: Bilateral nonobstructive nephrolithiasis. Chronic bilateral maxillary sinusitis.   05/11/2018 PET scan   1. Continued chronic accentuated metabolic activity in the vicinity of right level IIB and the adjacent right sternocleidomastoid muscle, with ill definition of surrounding tissue planes. Maximum SUV is currently 8.1, formerly 9.5. Accentuated metabolic activity is been present in this vicinity back through 06/25/2015, and there was also some low-level activity in this vicinity on 06/07/2014. Some of this may be from scarring and local muscular activity although clearly a component of residual tumor is difficult to exclude given the focally high activity. 2. Other imaging findings of potential clinical significance: Chronic bilateral maxillary sinusitis. Chronic scarring in the left upper lobe. Chronically stable 5 mm left lower lobe nodule is considered benign. Nonobstructive left nephrolithiasis.   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 COMMENT:No significant keratinization is identified. Other basaloid carcinomas are in the differential diagnosis. No cell block material is available for further testing.   09/12/2018 Procedure   He  underwent fine Needle Aspiration   10/04/2018 PET scan   1. Significant progression of local recurrence laterally in the mid right neck with an enlarging, increasingly hypermetabolic soft tissue mass. This involves the right sternocleidomastoid muscle. 2. Small lymph nodes in the right axilla are increasingly hypermetabolic. These are nonspecific and potentially reactive, although could reflect a small metastases. Small hypermetabolic nodule in the left suprasternal notch is unchanged. 3. No other evidence of metastatic disease.     10/07/2018 -  12/23/2018 Chemotherapy   The patient had cisplatin plus gemzar   12/07/2018 Imaging   1. Decreased size of lateral right neck mass. 2. Unchanged soft tissue nodule in the suprasternal notch. 3. No evidence of new metastatic disease in the neck.     05/30/2019 Imaging   CT neck No clear change or progression compared to the study of March. Overall measurements of the right lateral neck mass are similar, approximately 3 x 1.8 cm. See above discussion. One could argue that there is slight increase in lateral bulging, possibly with an increase in contrast enhancement, towards the inferior margin. This is of questionable validity but could possibly represent some progression or inflammatory change. Other findings in the region are stable.   07/20/2019 - 09/15/2019 Chemotherapy   The patient had gemzar and cisplatin   10/02/2019 Imaging   CT neck As compared to 05/30/2019, no significant interval change in size of an ill-defined mass within the right lateral neck, again measuring 3.3 x 1.8 cm in transaxial dimensions.   Unchanged mildly enlarged left level I lymph node measuring 1.1 cm in short axis.   Unchanged node or nodule at the thoracic inlet, measuring 1.3 x 0.8 cm.   Please refer to concurrently performed chest CT for a description of findings below the level of the thoracic inlet.     10/02/2019 Imaging   CT chest 1. No new or progressive  findings in the chest to suggest metastatic disease. 2. Bilateral subcentimeter solid pulmonary nodules are stable since 2018. 3. Hyperdense 1.1 cm anterior liver focus, not clearly visualized on prior studies. Suggest MRI abdomen without and with IV contrast for further characterization.   10/20/2019 - 12/29/2019 Chemotherapy   The patient had gemzar maintenance   06/12/2020 Imaging   1. Enlarging superficial, exophytic component of the chronic right sternocleidomastoid muscle mass. See series 6, image 55. 2. Elsewhere stable CT appearance of the Neck.   06/12/2020 Imaging   Post treatment scarring in the left hemithorax, stable. No evidence recurrent or metastatic disease   06/14/2020 - 10/15/2020 Chemotherapy   He received carboplatin, 5FU and Rande Lawman       10/31/2020 Procedure   Interval improvement in right lateral lymph node mass. Improvement in dermal component as well as invasion of the right sternocleidomastoid muscle.   10 mm submental lymph node slightly enlarged compared to the prior study. Continued follow-up recommended.   11/01/2020 - 05/22/2022 Chemotherapy   Patient is on Treatment Plan : HEAD/NECK Pembrolizumab Q21D     07/03/2022 - 10/30/2022 Chemotherapy   Patient is on Treatment Plan : Head and neck Pembrolizumab (400) q42d     11/30/2022 Imaging   CT chest  1. Stable exam. No new or progressive findings to suggest recurrent or metastatic disease. 2. Stable tiny bilateral nodules since 06/12/2020, consistent with benign etiology. 3. Punctate nonobstructing left renal stone.   12/01/2022 Imaging   CT neck  Growing right neck mass since 2022 with epidural tumor extension via the right C2-3 foramen. Cervical MRI with contrast would be contributory.   12/11/2022 - 04/02/2023 Chemotherapy   Patient is on Treatment Plan : HEAD/NECK toripalimab-tpzi D1 + cisplatin D1 + gemcitabine D1,8 q21d x 6 cycles / toripalimab-tpzi q21d (up to 62m)     04/15/2023 Imaging   CT Chest W  Contrast  Result Date: 04/15/2023 CLINICAL DATA:  Right neck mass, nasopharyngeal carcinoma EXAM: CT CHEST WITH CONTRAST TECHNIQUE: Multidetector CT imaging of the chest was performed during intravenous contrast administration. RADIATION  DOSE REDUCTION: This exam was performed according to the departmental dose-optimization program which includes automated exposure control, adjustment of the mA and/or kV according to patient size and/or use of iterative reconstruction technique. CONTRAST:  75mL OMNIPAQUE IOHEXOL 300 MG/ML  SOLN COMPARISON:  11/27/2022 FINDINGS: Cardiovascular: Right Port-A-Cath tip: Right atrium. Mediastinum/Nodes: Small left supraclavicular lymph node 0.5 cm in short axis on image 4 series 3, formerly the same. No pathologic adenopathy in the chest. Lungs/Pleura: Biapical pleuroparenchymal scarring, right greater than left. Stable left upper lobe scarring and peribronchovascular density with wedge resection line in the left upper lobe, no significant contour change of density in this vicinity to suggest malignancy. Stable 5 by 4 mm left lower lobe nodule and stable 3 by 4 mm right lower lobe nodule, unchanged from at least 2021, compatible with benign etiology. No new lesion identified. Upper Abdomen: 2 mm left kidney upper pole nonobstructive renal calculus, image 43 series 7. Musculoskeletal: Unremarkable IMPRESSION: 1. Stable postoperative findings in the left upper lobe. 2. Stable small bilateral lower lobe pulmonary nodules, unchanged from at least 2021, compatible with benign etiology. 3. 2 mm left kidney upper pole nonobstructive renal calculus. Electronically Signed   By: Gaylyn Rong M.D.   On: 04/15/2023 14:03   CT Soft Tissue Neck W Contrast  Result Date: 04/15/2023 CLINICAL DATA:  Head/neck cancer.  Last chemo 04/02/2023. EXAM: CT NECK WITH CONTRAST TECHNIQUE: Multidetector CT imaging of the neck was performed using the standard protocol following the bolus administration of  intravenous contrast. RADIATION DOSE REDUCTION: This exam was performed according to the departmental dose-optimization program which includes automated exposure control, adjustment of the mA and/or kV according to patient size and/or use of iterative reconstruction technique. CONTRAST:  75mL OMNIPAQUE IOHEXOL 300 MG/ML  SOLN COMPARISON:  CT neck 11/27/2022. FINDINGS: Pharynx and larynx: There is probable mucoid debris in the right nasal cavity/nasopharynx. The nasal cavity and nasopharynx are otherwise unremarkable. The oral cavity and oropharynx are unremarkable. The parapharyngeal spaces are clear. The hypopharynx and larynx are unremarkable. The epiglottis is normal. There is no retropharyngeal collection. The airway is patent. Salivary glands: Atrophy of the parotid and left submandibular glands is unchanged. The right submandibular gland is again not identified and may be surgically absent. Thyroid: Unremarkable. Lymph nodes: The infiltrative mass in the right neck is again seen. The mass invades through the right C2-C3 neural foramen into the epidural space resulting in mild narrowing of the thecal sac without suspected cord compression. The degree of intraspinal epidural tumor appears slightly increased compared to the study from 11/27/2022. Mass again encases the vertebral artery at the C2-C3 level (4-36), unchanged. The remainder of the infiltrative mass in the paraspinal musculature with extension into the overlying skin surface does not appear significantly changed in size or extent compared to the prior study. A 0.9 cm midline level I lymph node is unchanged going back to 2021. Otherwise, there is no new or progressive lymphadenopathy in the neck. Vascular: As above, there is encasement of the right vertebral artery at the C2-C3 level by the right neck mass. There is mild calcified plaque at the carotid bifurcations. The right IJ is occluded or sacrificed, unchanged. The left IJ is patent. A right chest  wall port is partially imaged. Limited intracranial: Unremarkable. Visualized orbits: Unremarkable. Mastoids and visualized paranasal sinuses: There is mild mucosal thickening in the maxillary sinuses. The mastoid air cells and middle ear cavities are clear. Skeleton: There is no acute osseous abnormality or suspicious osseous  lesion. Upper chest: Assessed on the separately dictated CT chest. Other: None. IMPRESSION: 1. Infiltrative mass again seen in the right neck centered in the paraspinal musculature with invasion through the right C2-C3 neural foramen. The bulk of the epidural tumor at this level appears slightly increased in size compared to the prior study from 11/27/2022 with probable leftward displacement of the cord but without frank cord compression. The remainder of the mass in the right neck is otherwise not significantly changed. 2. No new or progressive lymphadenopathy in the remainder of the neck. 3. Probable mucoid debris in the right nasal cavity/nasopharynx. No suspicious mass lesion or enhancement in the nasopharynx. Electronically Signed   By: Lesia Hausen M.D.   On: 04/15/2023 14:02      05/07/2023 -  Chemotherapy   Patient is on Treatment Plan : HEAD/NECK Cetuximab q14d + Carboplatin + 5FU IVCI D1-4 q21d x 6 cycles / Cetuximab q14d       PHYSICAL EXAMINATION: ECOG PERFORMANCE STATUS: 1 - Symptomatic but completely ambulatory  Vitals:   06/18/23 1101  BP: 115/66  Pulse: 97  Resp: 18  SpO2: 100%   Filed Weights   06/18/23 1101  Weight: 185 lb 12.8 oz (84.3 kg)    GENERAL:alert, no distress and comfortable SKIN: He has skin changes consistent with side effects of cetuximab EYES: normal, Conjunctiva are pink and non-injected, sclera clear OROPHARYNX:no exudate, no erythema and lips, buccal mucosa, and tongue normal  NECK: The neck mass has resolved LYMPH:  no palpable lymphadenopathy in the cervical, axillary or inguinal LUNGS: clear to auscultation and percussion with  normal breathing effort HEART: regular rate & rhythm and no murmurs and no lower extremity edema ABDOMEN:abdomen soft, non-tender and normal bowel sounds Musculoskeletal:no cyanosis of digits and no clubbing  NEURO: alert & oriented x 3 with fluent speech, no focal motor/sensory deficits  LABORATORY DATA:  I have reviewed the data as listed    Component Value Date/Time   NA 137 06/18/2023 1037   NA 139 09/22/2017 0829   K 3.7 06/18/2023 1037   K 3.5 09/22/2017 0829   CL 100 06/18/2023 1037   CO2 30 06/18/2023 1037   CO2 26 09/22/2017 0829   GLUCOSE 144 (H) 06/18/2023 1037   GLUCOSE 133 09/22/2017 0829   BUN 11 06/18/2023 1037   BUN 14.1 09/22/2017 0829   CREATININE 0.86 06/18/2023 1037   CREATININE 0.9 09/22/2017 0829   CALCIUM 9.1 06/18/2023 1037   CALCIUM 9.1 09/22/2017 0829   PROT 7.1 06/18/2023 1037   PROT 6.8 09/22/2017 0829   ALBUMIN 4.1 06/18/2023 1037   ALBUMIN 4.1 09/22/2017 0829   AST 24 06/18/2023 1037   AST 22 09/22/2017 0829   ALT 25 06/18/2023 1037   ALT 30 09/22/2017 0829   ALKPHOS 85 06/18/2023 1037   ALKPHOS 55 09/22/2017 0829   BILITOT 0.4 06/18/2023 1037   BILITOT 0.35 09/22/2017 0829   GFRNONAA >60 06/18/2023 1037   GFRAA >60 07/05/2020 0845   GFRAA >60 02/06/2019 1215    No results found for: "SPEP", "UPEP"  Lab Results  Component Value Date   WBC 3.1 (L) 06/18/2023   NEUTROABS 1.3 (L) 06/18/2023   HGB 10.5 (L) 06/18/2023   HCT 33.1 (L) 06/18/2023   MCV 89.7 06/18/2023   PLT 192 06/18/2023      Chemistry      Component Value Date/Time   NA 137 06/18/2023 1037   NA 139 09/22/2017 0829   K 3.7 06/18/2023 1037  K 3.5 09/22/2017 0829   CL 100 06/18/2023 1037   CO2 30 06/18/2023 1037   CO2 26 09/22/2017 0829   BUN 11 06/18/2023 1037   BUN 14.1 09/22/2017 0829   CREATININE 0.86 06/18/2023 1037   CREATININE 0.9 09/22/2017 0829      Component Value Date/Time   CALCIUM 9.1 06/18/2023 1037   CALCIUM 9.1 09/22/2017 0829   ALKPHOS 85  06/18/2023 1037   ALKPHOS 55 09/22/2017 0829   AST 24 06/18/2023 1037   AST 22 09/22/2017 0829   ALT 25 06/18/2023 1037   ALT 30 09/22/2017 0829   BILITOT 0.4 06/18/2023 1037   BILITOT 0.35 09/22/2017 0829

## 2023-06-18 NOTE — Assessment & Plan Note (Signed)
The patient has an amazing response.  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  He is very happy with the positive response to chemotherapy We discontinue his recent treatment to allow recovery from mucositis. That has resolved Will proceed with treatment with minor dose adjustment due to mild leukopenia and recent severe mucositis

## 2023-06-18 NOTE — Assessment & Plan Note (Signed)
The patient is having difficulties coping with side effects of treatment I am hopeful we can discontinue his treatment after only 4 cycles of therapy next month I refilled his prescription of lorazepam

## 2023-06-18 NOTE — Patient Instructions (Signed)
[facility]  Discharge Instructions: Thank you for choosing [facility] to provide your oncology and hematology care.   If you have a lab appointment with the Cancer Center, please go directly to the Cancer Center and check in at the registration area.   Wear comfortable clothing and clothing appropriate for easy access to any Portacath or PICC line.   We strive to give you quality time with your provider. You may need to reschedule your appointment if you arrive late (15 or more minutes).  Arriving late affects you and other patients whose appointments are after yours.  Also, if you miss three or more appointments without notifying the office, you may be dismissed from the clinic at the provider's discretion.      For prescription refill requests, have your pharmacy contact our office and allow 72 hours for refills to be completed.    Today you received the following chemotherapy and/or immunotherapy agents: Erbitux, Carboplatin, 5FU      To help prevent nausea and vomiting after your treatment, we encourage you to take your nausea medication as directed.  BELOW ARE SYMPTOMS THAT SHOULD BE REPORTED IMMEDIATELY: *FEVER GREATER THAN 100.4 F (38 C) OR HIGHER *CHILLS OR SWEATING *NAUSEA AND VOMITING THAT IS NOT CONTROLLED WITH YOUR NAUSEA MEDICATION *UNUSUAL SHORTNESS OF BREATH *UNUSUAL BRUISING OR BLEEDING *URINARY PROBLEMS (pain or burning when urinating, or frequent urination) *BOWEL PROBLEMS (unusual diarrhea, constipation, pain near the anus) TENDERNESS IN MOUTH AND THROAT WITH OR WITHOUT PRESENCE OF ULCERS (sore throat, sores in mouth, or a toothache) UNUSUAL RASH, SWELLING OR PAIN  UNUSUAL VAGINAL DISCHARGE OR ITCHING   Items with * indicate a potential emergency and should be followed up as soon as possible or go to the Emergency Department if any problems should occur.  Please show the CHEMOTHERAPY ALERT CARD or IMMUNOTHERAPY  ALERT CARD at check-in to the Emergency Department and triage nurse.  Should you have questions after your visit or need to cancel or reschedule your appointment, please contact [facility]  Dept: [phone]  and follow the prompts.  Office hours are 8:00 a.m. to 4:30 p.m. Monday - Friday. Please note that voicemails left after 4:00 p.m. may not be returned until the following business day.  We are closed weekends and major holidays. You have access to a nurse at all times for urgent questions. Please call the main number to the clinic Dept: [phone] and follow the prompts.   For any non-urgent questions, you may also contact your provider using MyChart. We now offer e-Visits for anyone 64 and older to request care online for non-urgent symptoms. For details visit [email or web address].   Also download the MyChart app! Go to the app store, search "MyChart", open the app, select [facility], and log in with your MyChart username and password.

## 2023-06-18 NOTE — Assessment & Plan Note (Signed)
He has severe constipation I recommend 1 dose of suppository due to no bowel movement for few days I reinforced the importance of regular laxative therapy

## 2023-06-18 NOTE — Assessment & Plan Note (Signed)
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

## 2023-06-18 NOTE — Progress Notes (Signed)
Per Dr. Bertis Ruddy, OK to treat with ANC 1.3.

## 2023-06-19 ENCOUNTER — Other Ambulatory Visit (HOSPITAL_COMMUNITY): Payer: Self-pay

## 2023-06-19 ENCOUNTER — Inpatient Hospital Stay: Payer: Medicaid Other

## 2023-06-19 VITALS — BP 130/72 | HR 86 | Temp 98.2°F

## 2023-06-19 DIAGNOSIS — Z95828 Presence of other vascular implants and grafts: Secondary | ICD-10-CM

## 2023-06-19 DIAGNOSIS — C7800 Secondary malignant neoplasm of unspecified lung: Secondary | ICD-10-CM

## 2023-06-19 DIAGNOSIS — C760 Malignant neoplasm of head, face and neck: Secondary | ICD-10-CM

## 2023-06-19 DIAGNOSIS — Z5111 Encounter for antineoplastic chemotherapy: Secondary | ICD-10-CM | POA: Diagnosis not present

## 2023-06-19 MED ORDER — SODIUM CHLORIDE 0.9 % IV SOLN: Freq: Once | INTRAVENOUS | Status: AC

## 2023-06-21 ENCOUNTER — Telehealth: Payer: Self-pay

## 2023-06-21 ENCOUNTER — Other Ambulatory Visit (HOSPITAL_COMMUNITY): Payer: Self-pay

## 2023-06-21 NOTE — Telephone Encounter (Signed)
Called back. Constipation is better and he has had a bm. He is feeling much better.

## 2023-06-21 NOTE — Telephone Encounter (Signed)
Called and left a message asking to call the office. Calling to see if his constipation is better.

## 2023-06-22 ENCOUNTER — Inpatient Hospital Stay: Payer: Medicaid Other

## 2023-06-22 DIAGNOSIS — C760 Malignant neoplasm of head, face and neck: Secondary | ICD-10-CM

## 2023-06-22 DIAGNOSIS — Z7189 Other specified counseling: Secondary | ICD-10-CM

## 2023-06-29 ENCOUNTER — Telehealth: Payer: Self-pay

## 2023-06-29 NOTE — Telephone Encounter (Signed)
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

## 2023-06-30 ENCOUNTER — Telehealth: Payer: Self-pay

## 2023-06-30 ENCOUNTER — Encounter: Payer: Self-pay | Admitting: Hematology and Oncology

## 2023-06-30 ENCOUNTER — Other Ambulatory Visit: Payer: Self-pay | Admitting: Hematology and Oncology

## 2023-06-30 ENCOUNTER — Other Ambulatory Visit (HOSPITAL_BASED_OUTPATIENT_CLINIC_OR_DEPARTMENT_OTHER): Payer: Self-pay

## 2023-06-30 DIAGNOSIS — C760 Malignant neoplasm of head, face and neck: Secondary | ICD-10-CM

## 2023-06-30 MED ORDER — OXYCODONE HCL 10 MG PO TABS
10.0000 mg | ORAL_TABLET | ORAL | 0 refills | Status: DC | PRN
  Filled 2023-06-30: qty 90, 15d supply, fill #0

## 2023-06-30 MED ORDER — METHADONE HCL 10 MG PO TABS
10.0000 mg | ORAL_TABLET | Freq: Three times a day (TID) | ORAL | 0 refills | Status: DC
  Filled 2023-06-30: qty 90, 30d supply, fill #0

## 2023-06-30 NOTE — Telephone Encounter (Signed)
I refilled them to [facility]

## 2023-06-30 NOTE — Telephone Encounter (Signed)
Pt called to inform him that his prescription refill request has been sent to [facility] pharmacy by Dr. Bertis Ruddy. Pt verbalized understanding.

## 2023-07-02 ENCOUNTER — Encounter: Payer: Self-pay | Admitting: Hematology and Oncology

## 2023-07-02 ENCOUNTER — Inpatient Hospital Stay (HOSPITAL_BASED_OUTPATIENT_CLINIC_OR_DEPARTMENT_OTHER): Payer: Medicaid Other | Admitting: Hematology and Oncology

## 2023-07-02 ENCOUNTER — Inpatient Hospital Stay: Payer: Medicaid Other

## 2023-07-02 ENCOUNTER — Other Ambulatory Visit (HOSPITAL_COMMUNITY): Payer: Self-pay

## 2023-07-02 VITALS — BP 150/82 | HR 97 | Temp 97.8°F | Resp 18 | Ht 66.0 in | Wt 185.0 lb

## 2023-07-02 DIAGNOSIS — C760 Malignant neoplasm of head, face and neck: Secondary | ICD-10-CM

## 2023-07-02 DIAGNOSIS — L270 Generalized skin eruption due to drugs and medicaments taken internally: Secondary | ICD-10-CM

## 2023-07-02 DIAGNOSIS — Z7189 Other specified counseling: Secondary | ICD-10-CM

## 2023-07-02 DIAGNOSIS — Z5111 Encounter for antineoplastic chemotherapy: Secondary | ICD-10-CM | POA: Diagnosis not present

## 2023-07-02 DIAGNOSIS — K1231 Oral mucositis (ulcerative) due to antineoplastic therapy: Secondary | ICD-10-CM

## 2023-07-02 DIAGNOSIS — G893 Neoplasm related pain (acute) (chronic): Secondary | ICD-10-CM

## 2023-07-02 DIAGNOSIS — F411 Generalized anxiety disorder: Secondary | ICD-10-CM

## 2023-07-02 LAB — CBC WITH DIFFERENTIAL (CANCER CENTER ONLY)
Abs Immature Granulocytes: 0.01 10*3/uL (ref 0.00–0.07)
Basophils Absolute: 0 10*3/uL (ref 0.0–0.1)
Basophils Relative: 1 %
Eosinophils Absolute: 0.1 10*3/uL (ref 0.0–0.5)
Eosinophils Relative: 4 %
HCT: 33.7 % — ABNORMAL LOW (ref 39.0–52.0)
Hemoglobin: 11.1 g/dL — ABNORMAL LOW (ref 13.0–17.0)
Immature Granulocytes: 0 %
Lymphocytes Relative: 40 %
Lymphs Abs: 1.5 10*3/uL (ref 0.7–4.0)
MCH: 29.7 pg (ref 26.0–34.0)
MCHC: 32.9 g/dL (ref 30.0–36.0)
MCV: 90.1 fL (ref 80.0–100.0)
Monocytes Absolute: 0.5 10*3/uL (ref 0.1–1.0)
Monocytes Relative: 12 %
Neutro Abs: 1.7 10*3/uL (ref 1.7–7.7)
Neutrophils Relative %: 43 %
Platelet Count: 130 10*3/uL — ABNORMAL LOW (ref 150–400)
RBC: 3.74 MIL/uL — ABNORMAL LOW (ref 4.22–5.81)
RDW: 17.4 % — ABNORMAL HIGH (ref 11.5–15.5)
WBC Count: 3.8 10*3/uL — ABNORMAL LOW (ref 4.0–10.5)
nRBC: 0 % (ref 0.0–0.2)

## 2023-07-02 LAB — BASIC METABOLIC PANEL - CANCER CENTER ONLY
Anion gap: 8 (ref 5–15)
BUN: 12 mg/dL (ref 6–20)
CO2: 30 mmol/L (ref 22–32)
Calcium: 9.4 mg/dL (ref 8.9–10.3)
Chloride: 101 mmol/L (ref 98–111)
Creatinine: 0.89 mg/dL (ref 0.61–1.24)
GFR, Estimated: >60 mL/min (ref >60)
Glucose, Bld: 151 mg/dL — ABNORMAL HIGH (ref 70–99)
Potassium: 3.3 mmol/L — ABNORMAL LOW (ref 3.5–5.1)
Sodium: 139 mmol/L (ref 135–145)

## 2023-07-02 LAB — MAGNESIUM: Magnesium: 1.7 mg/dL (ref 1.7–2.4)

## 2023-07-02 MED ORDER — DIPHENHYDRAMINE HCL 50 MG/ML IJ SOLN
50.0000 mg | Freq: Once | INTRAMUSCULAR | Status: AC
  Administered 2023-07-02: 50 mg via INTRAVENOUS
  Filled 2023-07-02: qty 1

## 2023-07-02 MED ORDER — EMPTY CONTAINERS FLEXIBLE MISC
300.0000 mg/m2 | Freq: Once | Status: AC
  Administered 2023-07-02: 600 mg via INTRAVENOUS
  Filled 2023-07-02: qty 300

## 2023-07-02 MED ORDER — FAMOTIDINE IN NACL 20-0.9 MG/50ML-% IV SOLN
20.0000 mg | Freq: Once | INTRAVENOUS | Status: AC
  Administered 2023-07-02: 20 mg via INTRAVENOUS
  Filled 2023-07-02: qty 50

## 2023-07-02 MED ORDER — SODIUM CHLORIDE 0.9 % IV SOLN: Freq: Once | INTRAVENOUS | Status: AC

## 2023-07-02 MED ORDER — OXYCODONE HCL 5 MG PO TABS
5.0000 mg | ORAL_TABLET | ORAL | Status: DC | PRN
Start: 2023-07-02 — End: 2023-07-16

## 2023-07-02 NOTE — Progress Notes (Signed)
[facility] OFFICE PROGRESS NOTE  Patient Care Team: Artis Delay, MD as PCP - General (Hematology and Oncology) Artis Delay, MD as Consulting Physician (Hematology and Oncology) Elfredia Nevins, MD as Referring Physician (Plastic Surgery) Hilarie Fredrickson, MD as Consulting Physician (Gastroenterology)  HISTORY OF PRESENTING ILLNESS: Discussed the use of AI scribe software for clinical note transcription with the patient, who gave verbal consent to proceed.  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 He describes feeling "normal" again, with a reduction in anxiety, dread, and depression that had been pervasive for the past three to four months. He denies any changes in routine or lifestyle that might account for this improvement.  The patient denies any pain and reports satisfactory control of symptoms with current regimen of methadone and oxycodone. He expresses a desire to reduce oxycodone dosage to 5mg  at the next refill.  He reports no pain from the lesion on the neck, which remains flat. No neuropathy symptoms such as numbness or tingling in hands or feet. However, he has some mouth sores, which have been improving over the past couple of days. He has been managing these with a 'magic mouthwash' for mucositis, which he has on hand.  The patient also reports an acne-like rash on the face, which had previously led to a prescription for an antibiotic pill. He had been advised to stop this medication, but given the recurrence of the rash, I have advised to restart it for at least seven days.  The patient also describes a significant emotional struggle during his cancer treatment, describing extreme emotional responses and difficulty coping. He reports using walking as a coping mechanism, but sleep disturbances  occur during particularly bad periods. He has lorazepam available for these instances.  The patient is currently on cycle three, day fifteen of their treatment regimen. He expresses a willingness to extend treatment to six cycles if he continues to tolerate it well. He also expresses a strong desire to continue treatment without breaks, despite the offer of an extra week to recover from side effects if needed.         Assessment and Plan    Cancer-related Pain Stable on current regimen of Methadone and Oxycodone. Patient reports adequate pain control and wishes to decrease Oxycodone dose. -Continue Methadone as prescribed. -Reduce Oxycodone to 5mg  at next refill per patient's request.  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 Patient has magic mouthwash on hand. -Continue use of magic mouthwash as needed.  Acneiform Rash Noted worsening of rash, likely secondary to Erbitux (cetuximab). Patient has Minocycline on hand. -Restart Minocycline for 7 days. -Reduce dose of Erbitux to mitigate rash.  Head & Neck Cancer Treatment Plan Currently on cycle 3, day 15 of treatment. Patient tolerating treatment well and lesion on neck remains flat. Discussed plan for at least 4 cycles, possibly extending to 6 cycles if well-tolerated. -Continue with current treatment plan. -Consider extending to 6 cycles if well-tolerated. -Next treatment (cycle 4) to start next month.  Emotional Health Patient reports significant emotional distress in the past, but currently feeling more like her normal self. Has Lorazepam on hand for bad days. -Encourage use of Lorazepam as needed on bad days. -Encourage continued physical activity (walking) as a coping mechanism.  Follow-up Blood work is satisfactory. No need for prescription refills at this time. -Check-in  next week during long infusion treatment. -Continue to monitor emotional health and manage side effects of treatment.           Orders Placed This Encounter  Procedures   TSH    Standing Status:   Future    Standing Expiration Date:   07/01/2024    All questions were answered. The patient knows to call the clinic with any problems, questions or concerns. The total time spent in the appointment was 40 minutes encounter with patients including review of chart and various tests results, discussions about plan of care and coordination of care plan   Artis Delay, MD 07/02/2023 10:46 AM  REVIEW OF SYSTEMS:   Constitutional: Denies fevers, chills or abnormal weight loss Eyes: Denies blurriness of vision Respiratory: Denies cough, dyspnea or wheezes Cardiovascular: Denies palpitation, chest discomfort or lower extremity swelling Gastrointestinal:  Denies nausea, heartburn or change in bowel habits Lymphatics: Denies new lymphadenopathy or easy bruising Neurological:Denies numbness, tingling or new weaknesses All other systems were reviewed with the patient and are negative.  I have reviewed the past medical history, past surgical history, social history and family history with the patient and they are unchanged from previous note.  ALLERGIES:  is allergic to phenergan [promethazine hcl], heparin, and clindamycin.  MEDICATIONS:  Current Outpatient Medications  Medication Sig Dispense Refill   oxyCODONE (OXY IR/ROXICODONE) 5 MG immediate release tablet Take 1 tablet (5 mg total) by mouth every 4 (four) hours as needed for severe pain.     bisacodyl (DULCOLAX) 10 MG suppository Place 1 suppository (10 mg total) rectally as needed for moderate constipation. 4 suppository 0   blood glucose meter kit and supplies KIT Dispense based on patient and insurance preference. Use up to four times daily as directed. (FOR ICD-9 250.00, 250.01). 1 each 1   cetirizine (ZYRTEC) 10 MG tablet Take 1 tablet (10 mg total) by mouth daily as needed for allergies. 100 tablet 0   dexamethasone (DECADRON) 4 MG tablet Take 1 tablet (4 mg  total) by mouth daily for 3 days after chemo, every 2 weeks. 30 tablet 0   famotidine (PEPCID) 40 MG tablet Take 1 tablet (40 mg total) by mouth at bedtime. 30 tablet 3   levothyroxine (SYNTHROID) 175 MCG tablet Take 1 tablet (175 mcg total) by mouth daily before breakfast. 30 tablet 3   LORazepam (ATIVAN) 0.5 MG tablet Take 1 tablet (0.5 mg total) by mouth 2 (two) times daily as needed for anxiety. 60 tablet 0   magic mouthwash w/lidocaine SOLN Take 5 mLs by mouth 4 (four) times daily. 5 mls 4 x a day for 10 days, swish and spit. 240 mL 0   metFORMIN (GLUCOPHAGE) 500 MG tablet Take 1 tablet (500 mg total) by mouth 2 (two) times daily with a meal. 60 tablet 2   methadone (DOLOPHINE) 10 MG tablet Take 1 tablet (10 mg) by mouth every 8 hours. 90 tablet 0   metroNIDAZOLE (METROGEL) 1 % gel Apply topically 2 (two) times daily as needed. 60 g 1   minocycline (MINOCIN) 100 MG capsule Take 1 capsule (100 mg total) by mouth 2 (two) times daily. 60 capsule 1   ondansetron (ZOFRAN) 8 MG tablet Take 1 tablet (8 mg total) by mouth every 8 (eight) hours as needed for nausea or vomiting. 90 tablet 1   pantoprazole (PROTONIX) 40 MG tablet Take 1 tablet (40 mg total) by mouth daily. 30 tablet 2   polyethylene glycol (MIRALAX / GLYCOLAX) 17 g  packet Take 17 g by mouth 2 (two) times daily.     prochlorperazine (COMPAZINE) 10 MG tablet Take 1 tablet (10 mg total) by mouth every 6 (six) hours as needed for nausea or vomiting. 30 tablet 2   senna-docusate (SENOKOT-S) 8.6-50 MG tablet Take 2 tablets by mouth 2 (two) times daily. 100 tablet 3   No current facility-administered medications for this visit.   Facility-Administered Medications Ordered in Other Visits  Medication Dose Route Frequency Provider Last Rate Last Admin   0.9 %  sodium chloride infusion   Intravenous Once Bertis Ruddy, Tramel Westbrook, MD       anticoagulant sodium citrate solution 5 mL  5 mL Intracatheter Once Bertis Ruddy, Darrik Richman, MD       anticoagulant sodium citrate  solution 5 mL  5 mL Intracatheter Once Bertis Ruddy, Jabrea Kallstrom, MD       cetuximab (ERBITUX) chemo infusion 600 mg  300 mg/m2 (Treatment Plan Recorded) Intravenous Once Bertis Ruddy, Wah Sabic, MD       diphenhydrAMINE (BENADRYL) injection 50 mg  50 mg Intravenous Once Bertis Ruddy, Sherlonda Flater, MD       famotidine (PEPCID) IVPB 20 mg premix  20 mg Intravenous Once Crysta Gulick, MD       sodium chloride flush (NS) 0.9 % injection 10 mL  10 mL Intracatheter Once Artis Delay, MD        SUMMARY OF ONCOLOGIC HISTORY: Oncology History Overview Note  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 Pan-endoscopy elsewhere showed cancer from nasopharyngeal space.   01/04/2007 - 02/20/2007 Chemotherapy   He received 2 cycles of cisplatin and 5FU followed by concurrent chemo with weekly cisplatin and radiation. He only received 2 doses of chemo due to severe mucositis, nausea and weight loss.   04/05/2007 - 08/04/2007 Chemotherapy   He received 4 more courses of cisplatin with 5FU and had complete response   07/05/2009 Procedure   Fine-needle aspirate of the right level II lymph nodes come from recurrent metastatic disease. Repeat endoscopy and CT scan show no evidence of disease elsewhere.   07/08/2009 - 12/02/2009 Chemotherapy   He was given 6 cycles of carboplatin, 5-FU and docetaxel   12/03/2009 Surgery   He has surgery to the residual lymph node on the right neck which showed no evidence of disease.   02/22/2012 Imaging   Repeat imaging study  showed large recurrent mass. He was referred elsewhere for further treatment.   05/03/2012 Surgery   He underwent left upper lobectomy.   04/29/2013 Imaging   PEt scan showed lesion on right level II B and lower lung was abnormal   06/03/2013 - 02/02/2014 Chemotherapy   He had 6 cycles of chemotherapy when he was found to have recurrence of cancer and had received oxaliplatin and capecitabine   06/07/2014 Imaging   PET CT scan showed persistent disease in the right neck lymph nodes and left lung   06/29/2014 Procedure   Accession: [id number] repeat LUL biopsy confirmed metastatic cancer   07/18/2014 -  07/31/2014 Radiation Therapy   He received palliative radiation therapy to the lungs   10/10/2014 Imaging   CT scan of the chest, abdomen and pelvis show regression in the size of the lung nodule in the left upper lobe and stable pulmonary nodules   01/24/2015 Imaging   CT scan showed stable disease in neck and lung   06/19/2015 Imaging   CT scan of the neck and the chest show possible mild progression of the nodule in the right side of the neck.   06/25/2015 Imaging   PET scan confirmed disease recurrence in the neck   07/07/2015 Imaging   He had MRI neck at [facility]   09/03/2015 - 08/26/2018 Chemotherapy   He received palliative chemo with Nivolumab   10/29/2015 Imaging   PET CT showed positive response to Rx   02/28/2016 Imaging   Ct abdomen showed abnormal thinkening in his stomach   03/03/2016 Imaging   CT: Right sternocleidomastoid muscle metastasis appears less distinct but otherwise not significantly changed in size or configuration since 06/19/2015.2. Left level 3 lymph node which was hypermetabolic by PET-CT in January 2017 appears slightly smaller   04/01/2016 Imaging   CT cervical spine showed no acute fracture or traumatic malalignment in the cervical spine   04/22/2016 Procedure   Port-a-cath placed.   06/16/2016 Imaging   Ct neck showed right sternocleidomastoid muscle  metastasis is further decreased in conspicuity since May, and has mildly decreased in size since September 2016. Continued stability of sub-centimeter left cervical lymph nodes. No new or progressive metastatic disease in the neck.   06/16/2016 Imaging   CT chest showed stable masslike radiation fibrosis in the left upper lobe. Stable subcentimeter pulmonary nodules in the bilateral lower lobes. No new or progressive metastatic disease in the chest. Nonobstructing left renal stone.   10/13/2016 Imaging   Ct neck showed unchanged right sternocleidomastoid muscle metastasis. Unchanged subcentimeter left cervical lymph nodes. No evidence of new or progressive metastatic disease in the neck.   10/13/2016 Imaging   CT chest showed tiny hypervascular foci in the liver, not definitely seen on prior imaging of 06/16/2016 and 02/28/2016. Abdomen MRI without and with contrast recommended to further evaluate as metastatic disease is a concern. 2. Stable appearance of post treatment changes left upper lung and scattered tiny bilateral pulmonary nodules.   02/11/2017 Imaging   Ct neck: Lymph node mass right posterior neck appears improved from the prior study. Small posterior lymph nodes on the left unchanged. Occluded right jugular vein unchanged.   02/11/2017 Imaging   1. Similar appearance of postsurgical and radiation changes in the left upper lobe. 2. Similar bilateral pulmonary nodules. 3. No thoracic adenopathy. 4. Subtle foci of post-contrast enhancement within the liver are suboptimally characterized on this nondedicated study. Likely similar. These could either be re-evaluated at followup or more entirely characterized with abdominal MRI. 5. Left nephrolithiasis.   05/19/2017 Imaging   Matted lymph node mass right posterior neck appears larger in the recent CT. Accurate measurements difficult due to infiltrating tumor margins and infiltration of the muscle. Right jugular vein again appears occluded or  resected. Small left posterior lymph nodes stable. Left upper lobe airspace density stable and similar to the prior CT   06/03/2017 PET scan   1. Hypermetabolic ill-defined right level IIb lymph node, about 1.3 cm in diameter with maximum SUV 9.5 (formerly 8.1). Appearance suspicious for residual/recurrent malignancy. No worrisome left-sided lesion. 2. Left suprahilar indistinct opacity demonstrates no worrisome hypermetabolic activity. The  5 mm left lower lobe pulmonary nodule is stable and not currently hypermetabolic although below sensitive PET-CT size thresholds. 3. Other imaging findings of potential clinical significance: Bilateral nonobstructive nephrolithiasis. Chronic bilateral maxillary sinusitis.   05/11/2018 PET scan   1. Continued chronic accentuated metabolic activity in the vicinity of right level IIB and the adjacent right sternocleidomastoid muscle, with ill definition of surrounding tissue planes. Maximum SUV is currently 8.1, formerly 9.5. Accentuated metabolic activity is been present in this vicinity back through 06/25/2015, and there was also some low-level activity in this vicinity on 06/07/2014. Some of this may be from scarring and local muscular activity although clearly a component of residual tumor is difficult to exclude given the focally high activity. 2. Other imaging findings of potential clinical significance: Chronic bilateral maxillary sinusitis. Chronic scarring in the left upper lobe. Chronically stable 5 mm left lower lobe nodule is considered benign. Nonobstructive left nephrolithiasis.   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 COMMENT:No significant keratinization is identified. Other basaloid carcinomas are in the differential diagnosis. No cell block material is available for further testing.   09/12/2018  Procedure   He underwent fine Needle Aspiration   10/04/2018 PET scan   1. Significant progression of local recurrence laterally in the mid right neck with an enlarging, increasingly hypermetabolic soft tissue mass. This involves the right sternocleidomastoid muscle. 2. Small lymph nodes in the right axilla are increasingly hypermetabolic. These are nonspecific and potentially reactive, although could reflect a small metastases. Small hypermetabolic nodule in the left suprasternal notch is unchanged. 3. No other evidence of metastatic disease.     10/07/2018 - 12/23/2018 Chemotherapy   The patient had cisplatin plus gemzar   12/07/2018 Imaging   1. Decreased size of lateral right neck mass. 2. Unchanged soft tissue nodule in the suprasternal notch. 3. No evidence of new metastatic disease in the neck.     05/30/2019 Imaging   CT neck No clear change or progression compared to the study of March. Overall measurements of the right lateral neck mass are similar, approximately 3 x 1.8 cm. See above discussion. One could argue that there is slight increase in lateral bulging, possibly with an increase in contrast enhancement, towards the inferior margin. This is of questionable validity but could possibly represent some progression or inflammatory change. Other findings in the region are stable.   07/20/2019 - 09/15/2019 Chemotherapy   The patient had gemzar and cisplatin   10/02/2019 Imaging   CT neck As compared to 05/30/2019, no significant interval change in size of an ill-defined mass within the right lateral neck, again measuring 3.3 x 1.8 cm in transaxial dimensions.   Unchanged mildly enlarged left level I lymph node measuring 1.1 cm in short axis.   Unchanged node or nodule at the thoracic inlet, measuring 1.3 x 0.8 cm.   Please refer to concurrently performed chest CT for a description of findings below the level of the thoracic inlet.     10/02/2019 Imaging   CT chest 1. No new  or progressive findings in the chest to suggest metastatic disease. 2. Bilateral subcentimeter solid pulmonary nodules are stable since 2018. 3. Hyperdense 1.1 cm anterior liver focus, not clearly visualized on prior studies. Suggest MRI abdomen without and with IV contrast for further characterization.   10/20/2019 - 12/29/2019 Chemotherapy   The patient had  gemzar maintenance   06/12/2020 Imaging   1. Enlarging superficial, exophytic component of the chronic right sternocleidomastoid muscle mass. See series 6, image 55. 2. Elsewhere stable CT appearance of the Neck.   06/12/2020 Imaging   Post treatment scarring in the left hemithorax, stable. No evidence recurrent or metastatic disease   06/14/2020 - 10/15/2020 Chemotherapy   He received carboplatin, 5FU and Rande Lawman       10/31/2020 Procedure   Interval improvement in right lateral lymph node mass. Improvement in dermal component as well as invasion of the right sternocleidomastoid muscle.   10 mm submental lymph node slightly enlarged compared to the prior study. Continued follow-up recommended.   11/01/2020 - 05/22/2022 Chemotherapy   Patient is on Treatment Plan : HEAD/NECK Pembrolizumab Q21D     07/03/2022 - 10/30/2022 Chemotherapy   Patient is on Treatment Plan : Head and neck Pembrolizumab (400) q42d     11/30/2022 Imaging   CT chest  1. Stable exam. No new or progressive findings to suggest recurrent or metastatic disease. 2. Stable tiny bilateral nodules since 06/12/2020, consistent with benign etiology. 3. Punctate nonobstructing left renal stone.   12/01/2022 Imaging   CT neck  Growing right neck mass since 2022 with epidural tumor extension via the right C2-3 foramen. Cervical MRI with contrast would be contributory.   12/11/2022 - 04/02/2023 Chemotherapy   Patient is on Treatment Plan : HEAD/NECK toripalimab-tpzi D1 + cisplatin D1 + gemcitabine D1,8 q21d x 6 cycles / toripalimab-tpzi q21d (up to 73m)     04/15/2023  Imaging   CT Chest W Contrast  Result Date: 04/15/2023 CLINICAL DATA:  Right neck mass, nasopharyngeal carcinoma EXAM: CT CHEST WITH CONTRAST TECHNIQUE: Multidetector CT imaging of the chest was performed during intravenous contrast administration. RADIATION DOSE REDUCTION: This exam was performed according to the departmental dose-optimization program which includes automated exposure control, adjustment of the mA and/or kV according to patient size and/or use of iterative reconstruction technique. CONTRAST:  75mL OMNIPAQUE IOHEXOL 300 MG/ML  SOLN COMPARISON:  11/27/2022 FINDINGS: Cardiovascular: Right Port-A-Cath tip: Right atrium. Mediastinum/Nodes: Small left supraclavicular lymph node 0.5 cm in short axis on image 4 series 3, formerly the same. No pathologic adenopathy in the chest. Lungs/Pleura: Biapical pleuroparenchymal scarring, right greater than left. Stable left upper lobe scarring and peribronchovascular density with wedge resection line in the left upper lobe, no significant contour change of density in this vicinity to suggest malignancy. Stable 5 by 4 mm left lower lobe nodule and stable 3 by 4 mm right lower lobe nodule, unchanged from at least 2021, compatible with benign etiology. No new lesion identified. Upper Abdomen: 2 mm left kidney upper pole nonobstructive renal calculus, image 43 series 7. Musculoskeletal: Unremarkable IMPRESSION: 1. Stable postoperative findings in the left upper lobe. 2. Stable small bilateral lower lobe pulmonary nodules, unchanged from at least 2021, compatible with benign etiology. 3. 2 mm left kidney upper pole nonobstructive renal calculus. Electronically Signed   By: Gaylyn Rong M.D.   On: 04/15/2023 14:03   CT Soft Tissue Neck W Contrast  Result Date: 04/15/2023 CLINICAL DATA:  Head/neck cancer.  Last chemo 04/02/2023. EXAM: CT NECK WITH CONTRAST TECHNIQUE: Multidetector CT imaging of the neck was performed using the standard protocol following the  bolus administration of intravenous contrast. RADIATION DOSE REDUCTION: This exam was performed according to the departmental dose-optimization program which includes automated exposure control, adjustment of the mA and/or kV according to patient size and/or use of iterative reconstruction technique. CONTRAST:  75mL OMNIPAQUE IOHEXOL 300 MG/ML  SOLN COMPARISON:  CT neck 11/27/2022. FINDINGS: Pharynx and larynx: There is probable mucoid debris in the right nasal cavity/nasopharynx. The nasal cavity and nasopharynx are otherwise unremarkable. The oral cavity and oropharynx are unremarkable. The parapharyngeal spaces are clear. The hypopharynx and larynx are unremarkable. The epiglottis is normal. There is no retropharyngeal collection. The airway is patent. Salivary glands: Atrophy of the parotid and left submandibular glands is unchanged. The right submandibular gland is again not identified and may be surgically absent. Thyroid: Unremarkable. Lymph nodes: The infiltrative mass in the right neck is again seen. The mass invades through the right C2-C3 neural foramen into the epidural space resulting in mild narrowing of the thecal sac without suspected cord compression. The degree of intraspinal epidural tumor appears slightly increased compared to the study from 11/27/2022. Mass again encases the vertebral artery at the C2-C3 level (4-36), unchanged. The remainder of the infiltrative mass in the paraspinal musculature with extension into the overlying skin surface does not appear significantly changed in size or extent compared to the prior study. A 0.9 cm midline level I lymph node is unchanged going back to 2021. Otherwise, there is no new or progressive lymphadenopathy in the neck. Vascular: As above, there is encasement of the right vertebral artery at the C2-C3 level by the right neck mass. There is mild calcified plaque at the carotid bifurcations. The right IJ is occluded or sacrificed, unchanged. The left IJ  is patent. A right chest wall port is partially imaged. Limited intracranial: Unremarkable. Visualized orbits: Unremarkable. Mastoids and visualized paranasal sinuses: There is mild mucosal thickening in the maxillary sinuses. The mastoid air cells and middle ear cavities are clear. Skeleton: There is no acute osseous abnormality or suspicious osseous lesion. Upper chest: Assessed on the separately dictated CT chest. Other: None. IMPRESSION: 1. Infiltrative mass again seen in the right neck centered in the paraspinal musculature with invasion through the right C2-C3 neural foramen. The bulk of the epidural tumor at this level appears slightly increased in size compared to the prior study from 11/27/2022 with probable leftward displacement of the cord but without frank cord compression. The remainder of the mass in the right neck is otherwise not significantly changed. 2. No new or progressive lymphadenopathy in the remainder of the neck. 3. Probable mucoid debris in the right nasal cavity/nasopharynx. No suspicious mass lesion or enhancement in the nasopharynx. Electronically Signed   By: Lesia Hausen M.D.   On: 04/15/2023 14:02      05/07/2023 -  Chemotherapy   Patient is on Treatment Plan : HEAD/NECK Cetuximab q14d + Carboplatin + 5FU IVCI D1-4 q21d x 6 cycles / Cetuximab q14d       PHYSICAL EXAMINATION: ECOG PERFORMANCE STATUS: 1 - Symptomatic but completely ambulatory  Vitals:   07/02/23 0957  BP: (!) 150/82  Pulse: 97  Resp: 18  Temp: 97.8 F (36.6 C)  SpO2: 100%   Filed Weights   07/02/23 0957  Weight: 185 lb (83.9 kg)    GENERAL:alert, no distress and comfortable SKIN: noted acneiform rash EYES: normal, Conjunctiva are pink and non-injected, sclera clear OROPHARYNX:no exudate, no erythema and lips, buccal mucosa, and tongue normal  NECK: neck mass is flat  LABORATORY DATA:  I have reviewed the data as listed    Component Value Date/Time   NA 139 07/02/2023 0937   NA 139  09/22/2017 0829   K 3.3 (L) 07/02/2023 0937   K 3.5 09/22/2017 3086  CL 101 07/02/2023 0937   CO2 30 07/02/2023 0937   CO2 26 09/22/2017 0829   GLUCOSE 151 (H) 07/02/2023 0937   GLUCOSE 133 09/22/2017 0829   BUN 12 07/02/2023 0937   BUN 14.1 09/22/2017 0829   CREATININE 0.89 07/02/2023 0937   CREATININE 0.9 09/22/2017 0829   CALCIUM 9.4 07/02/2023 0937   CALCIUM 9.1 09/22/2017 0829   PROT 7.1 06/18/2023 1037   PROT 6.8 09/22/2017 0829   ALBUMIN 4.1 06/18/2023 1037   ALBUMIN 4.1 09/22/2017 0829   AST 24 06/18/2023 1037   AST 22 09/22/2017 0829   ALT 25 06/18/2023 1037   ALT 30 09/22/2017 0829   ALKPHOS 85 06/18/2023 1037   ALKPHOS 55 09/22/2017 0829   BILITOT 0.4 06/18/2023 1037   BILITOT 0.35 09/22/2017 0829   GFRNONAA >60 07/02/2023 0937   GFRAA >60 07/05/2020 0845   GFRAA >60 02/06/2019 1215    No results found for: "SPEP", "UPEP"  Lab Results  Component Value Date   WBC 3.8 (L) 07/02/2023   NEUTROABS 1.7 07/02/2023   HGB 11.1 (L) 07/02/2023   HCT 33.7 (L) 07/02/2023   MCV 90.1 07/02/2023   PLT 130 (L) 07/02/2023      Chemistry      Component Value Date/Time   NA 139 07/02/2023 0937   NA 139 09/22/2017 0829   K 3.3 (L) 07/02/2023 0937   K 3.5 09/22/2017 0829   CL 101 07/02/2023 0937   CO2 30 07/02/2023 0937   CO2 26 09/22/2017 0829   BUN 12 07/02/2023 0937   BUN 14.1 09/22/2017 0829   CREATININE 0.89 07/02/2023 0937   CREATININE 0.9 09/22/2017 0829      Component Value Date/Time   CALCIUM 9.4 07/02/2023 0937   CALCIUM 9.1 09/22/2017 0829   ALKPHOS 85 06/18/2023 1037   ALKPHOS 55 09/22/2017 0829   AST 24 06/18/2023 1037   AST 22 09/22/2017 0829   ALT 25 06/18/2023 1037   ALT 30 09/22/2017 0829   BILITOT 0.4 06/18/2023 1037   BILITOT 0.35 09/22/2017 0829       RADIOGRAPHIC STUDIES: I have personally reviewed the radiological images as listed and agreed with the findings in the report. No results found.

## 2023-07-02 NOTE — Patient Instructions (Signed)
[facility]  Discharge Instructions: Thank you for choosing [facility] to provide your oncology and hematology care.   If you have a lab appointment with the Cancer Center, please go directly to the Cancer Center and check in at the registration area.   Wear comfortable clothing and clothing appropriate for easy access to any Portacath or PICC line.   We strive to give you quality time with your provider. You may need to reschedule your appointment if you arrive late (15 or more minutes).  Arriving late affects you and other patients whose appointments are after yours.  Also, if you miss three or more appointments without notifying the office, you may be dismissed from the clinic at the provider's discretion.      For prescription refill requests, have your pharmacy contact our office and allow 72 hours for refills to be completed.    Today you received the following chemotherapy and/or immunotherapy agents Erbitux      To help prevent nausea and vomiting after your treatment, we encourage you to take your nausea medication as directed.  BELOW ARE SYMPTOMS THAT SHOULD BE REPORTED IMMEDIATELY: *FEVER GREATER THAN 100.4 F (38 C) OR HIGHER *CHILLS OR SWEATING *NAUSEA AND VOMITING THAT IS NOT CONTROLLED WITH YOUR NAUSEA MEDICATION *UNUSUAL SHORTNESS OF BREATH *UNUSUAL BRUISING OR BLEEDING *URINARY PROBLEMS (pain or burning when urinating, or frequent urination) *BOWEL PROBLEMS (unusual diarrhea, constipation, pain near the anus) TENDERNESS IN MOUTH AND THROAT WITH OR WITHOUT PRESENCE OF ULCERS (sore throat, sores in mouth, or a toothache) UNUSUAL RASH, SWELLING OR PAIN  UNUSUAL VAGINAL DISCHARGE OR ITCHING   Items with * indicate a potential emergency and should be followed up as soon as possible or go to the Emergency Department if any problems should occur.  Please show the CHEMOTHERAPY ALERT CARD or IMMUNOTHERAPY ALERT CARD at check-in  to the Emergency Department and triage nurse.  Should you have questions after your visit or need to cancel or reschedule your appointment, please contact [facility]  Dept: [phone]  and follow the prompts.  Office hours are 8:00 a.m. to 4:30 p.m. Monday - Friday. Please note that voicemails left after 4:00 p.m. may not be returned until the following business day.  We are closed weekends and major holidays. You have access to a nurse at all times for urgent questions. Please call the main number to the clinic Dept: [phone] and follow the prompts.   For any non-urgent questions, you may also contact your provider using MyChart. We now offer e-Visits for anyone 65 and older to request care online for non-urgent symptoms. For details visit [email or web address].   Also download the MyChart app! Go to the app store, search "MyChart", open the app, select [place], and log in with your MyChart username and password.

## 2023-07-02 NOTE — Progress Notes (Signed)
Ok to proceed with treatment today with potassium level of 3.3 today per Dr. Bertis Ruddy.

## 2023-07-08 MED FILL — Fosaprepitant Dimeglumine For IV Infusion 150 MG (Base Eq): INTRAVENOUS | Qty: 5 | Status: AC

## 2023-07-08 MED FILL — Dexamethasone Sodium Phosphate Inj 100 MG/10ML: INTRAMUSCULAR | Qty: 1 | Status: AC

## 2023-07-09 ENCOUNTER — Inpatient Hospital Stay: Payer: Medicaid Other | Attending: Hematology and Oncology

## 2023-07-09 ENCOUNTER — Inpatient Hospital Stay: Payer: Medicaid Other | Admitting: Hematology and Oncology

## 2023-07-09 ENCOUNTER — Encounter: Payer: Self-pay | Admitting: Hematology and Oncology

## 2023-07-09 ENCOUNTER — Inpatient Hospital Stay: Payer: Medicaid Other

## 2023-07-09 VITALS — BP 136/90 | HR 89 | Temp 99.0°F | Resp 18 | Ht 66.0 in | Wt 182.8 lb

## 2023-07-09 DIAGNOSIS — C7800 Secondary malignant neoplasm of unspecified lung: Secondary | ICD-10-CM

## 2023-07-09 DIAGNOSIS — Z5112 Encounter for antineoplastic immunotherapy: Secondary | ICD-10-CM | POA: Insufficient documentation

## 2023-07-09 DIAGNOSIS — Z7189 Other specified counseling: Secondary | ICD-10-CM

## 2023-07-09 DIAGNOSIS — C760 Malignant neoplasm of head, face and neck: Secondary | ICD-10-CM

## 2023-07-09 DIAGNOSIS — C119 Malignant neoplasm of nasopharynx, unspecified: Secondary | ICD-10-CM | POA: Insufficient documentation

## 2023-07-09 DIAGNOSIS — F411 Generalized anxiety disorder: Secondary | ICD-10-CM

## 2023-07-09 DIAGNOSIS — Z79899 Other long term (current) drug therapy: Secondary | ICD-10-CM | POA: Diagnosis not present

## 2023-07-09 DIAGNOSIS — Z95828 Presence of other vascular implants and grafts: Secondary | ICD-10-CM

## 2023-07-09 LAB — CMP (CANCER CENTER ONLY)
ALT: 18 U/L (ref 0–44)
AST: 20 U/L (ref 15–41)
Albumin: 4 g/dL (ref 3.5–5.0)
Alkaline Phosphatase: 92 U/L (ref 38–126)
Anion gap: 7 (ref 5–15)
BUN: 12 mg/dL (ref 6–20)
CO2: 30 mmol/L (ref 22–32)
Calcium: 9.2 mg/dL (ref 8.9–10.3)
Chloride: 103 mmol/L (ref 98–111)
Creatinine: 0.76 mg/dL (ref 0.61–1.24)
GFR, Estimated: >60 mL/min (ref >60)
Glucose, Bld: 177 mg/dL — ABNORMAL HIGH (ref 70–99)
Potassium: 3.3 mmol/L — ABNORMAL LOW (ref 3.5–5.1)
Sodium: 140 mmol/L (ref 135–145)
Total Bilirubin: 0.3 mg/dL (ref 0.3–1.2)
Total Protein: 6.9 g/dL (ref 6.5–8.1)

## 2023-07-09 LAB — CBC WITH DIFFERENTIAL (CANCER CENTER ONLY)
Abs Immature Granulocytes: 0 10*3/uL (ref 0.00–0.07)
Basophils Absolute: 0 10*3/uL (ref 0.0–0.1)
Basophils Relative: 1 %
Eosinophils Absolute: 0.1 10*3/uL (ref 0.0–0.5)
Eosinophils Relative: 4 %
HCT: 35.2 % — ABNORMAL LOW (ref 39.0–52.0)
Hemoglobin: 11.4 g/dL — ABNORMAL LOW (ref 13.0–17.0)
Immature Granulocytes: 0 %
Lymphocytes Relative: 45 %
Lymphs Abs: 1.6 10*3/uL (ref 0.7–4.0)
MCH: 29.3 pg (ref 26.0–34.0)
MCHC: 32.4 g/dL (ref 30.0–36.0)
MCV: 90.5 fL (ref 80.0–100.0)
Monocytes Absolute: 0.6 10*3/uL (ref 0.1–1.0)
Monocytes Relative: 18 %
Neutro Abs: 1 10*3/uL — ABNORMAL LOW (ref 1.7–7.7)
Neutrophils Relative %: 32 %
Platelet Count: 198 10*3/uL (ref 150–400)
RBC: 3.89 MIL/uL — ABNORMAL LOW (ref 4.22–5.81)
RDW: 17.7 % — ABNORMAL HIGH (ref 11.5–15.5)
WBC Count: 3.3 10*3/uL — ABNORMAL LOW (ref 4.0–10.5)
nRBC: 0 % (ref 0.0–0.2)

## 2023-07-09 LAB — TSH: TSH: 3.89 u[IU]/mL (ref 0.350–4.500)

## 2023-07-09 MED ORDER — SODIUM CHLORIDE 0.9% FLUSH
10.0000 mL | Freq: Once | INTRAVENOUS | Status: AC
  Administered 2023-07-09: 10 mL

## 2023-07-09 NOTE — Progress Notes (Signed)
[facility] OFFICE PROGRESS NOTE  Patient Care Team: Artis Delay, MD as PCP - General (Hematology and Oncology) Artis Delay, MD as Consulting Physician (Hematology and Oncology) Elfredia Nevins, MD as Referring Physician (Plastic Surgery) Hilarie Fredrickson, MD as Consulting Physician (Gastroenterology)  HISTORY OF PRESENTING ILLNESS: Discussed the use of AI scribe software for clinical note transcription with the patient, who gave verbal consent to proceed.  History of Present Illness   The patient, with a history of cancer recurrent head and neck ca, presents with complaints of nausea, headaches, and feeling cold, which he attributes to a possible flu or cold. He reports that his mouth sores have resolved, and his skin condition is not too severe, thanks to the antibiotics he is taking. His pain is described as 'not so bad, not so good.'  He has been on chemotherapy treatment, which he believes has been helping, but recently he has been feeling unwell. His white blood cell count is reported to be low, which he understands is due to the back-to-back chemotherapy treatments without a break. He expresses concern about the impact of skipping a treatment session but ultimately decides against receiving treatment on this day due to feeling unwell.  The patient also reports no constipation, attributing this to daily use of Miralax. He denies needing a refill on his pain medication. He expresses a desire for a break from treatment and shows concern about making decisions regarding his treatment plan.         Assessment and Plan    Chemotherapy-induced Leukopenia White blood cell count is low, likely due to chemotherapy. Patient is experiencing symptoms of a cold or flu, including headache and feeling cold. Discussed the risks and benefits of continuing treatment today. -Postpone chemotherapy today due to low white blood cell count and patient feeling unwell. -Keep the rest of the  appointments as scheduled for now.  Nausea Patient reports significant nausea, possibly related to chemotherapy or a viral illness. -Continue current management. If symptoms persist or worsen, consider antiemetic medication adjustment.  Constipation No current issues. Patient is taking Miralax daily. -Continue current management with Miralax.  Pain Management No current issues. Patient does not require a refill of pain medication. -Continue current management.  Oral and Skin Health Oral sores have resolved and skin condition is not too bad, likely due to antibiotic use. -Continue current management with antibiotics.  General Health Maintenance / Followup Plans -Consider over-the-counter allergy medications like Claritin or Benadryl for congestion. -Follow-up appointments as scheduled for next week.          No orders of the defined types were placed in this encounter.   All questions were answered. The patient knows to call the clinic with any problems, questions or concerns. The total time spent in the appointment was 40 minutes encounter with patients including review of chart and various tests results, discussions about plan of care and coordination of care plan   Artis Delay, MD 07/09/2023 11:32 AM  REVIEW OF SYSTEMS:   Eyes: Denies blurriness of vision Ears, nose, mouth, throat, and face: Denies mucositis or sore throat Respiratory: Denies cough, dyspnea or wheezes Cardiovascular: Denies palpitation, chest discomfort or lower extremity swelling Lymphatics: Denies new lymphadenopathy or easy bruising Neurological:Denies numbness, tingling or new weaknesses Behavioral/Psych: Mood is stable, no new changes  All other systems were reviewed with the patient and are negative.  I have reviewed the past medical history, past surgical history, social history and family history with the  patient and they are unchanged from previous note.  ALLERGIES:  is allergic to phenergan  [promethazine hcl], heparin, and clindamycin.  MEDICATIONS:  Current Outpatient Medications  Medication Sig Dispense Refill   bisacodyl (DULCOLAX) 10 MG suppository Place 1 suppository (10 mg total) rectally as needed for moderate constipation. 4 suppository 0   blood glucose meter kit and supplies KIT Dispense based on patient and insurance preference. Use up to four times daily as directed. (FOR ICD-9 250.00, 250.01). 1 each 1   cetirizine (ZYRTEC) 10 MG tablet Take 1 tablet (10 mg total) by mouth daily as needed for allergies. 100 tablet 0   dexamethasone (DECADRON) 4 MG tablet Take 1 tablet (4 mg total) by mouth daily for 3 days after chemo, every 2 weeks. 30 tablet 0   famotidine (PEPCID) 40 MG tablet Take 1 tablet (40 mg total) by mouth at bedtime. 30 tablet 3   levothyroxine (SYNTHROID) 175 MCG tablet Take 1 tablet (175 mcg total) by mouth daily before breakfast. 30 tablet 3   LORazepam (ATIVAN) 0.5 MG tablet Take 1 tablet (0.5 mg total) by mouth 2 (two) times daily as needed for anxiety. 60 tablet 0   magic mouthwash w/lidocaine SOLN Take 5 mLs by mouth 4 (four) times daily. 5 mls 4 x a day for 10 days, swish and spit. 240 mL 0   metFORMIN (GLUCOPHAGE) 500 MG tablet Take 1 tablet (500 mg total) by mouth 2 (two) times daily with a meal. 60 tablet 2   methadone (DOLOPHINE) 10 MG tablet Take 1 tablet (10 mg) by mouth every 8 hours. 90 tablet 0   metroNIDAZOLE (METROGEL) 1 % gel Apply topically 2 (two) times daily as needed. 60 g 1   minocycline (MINOCIN) 100 MG capsule Take 1 capsule (100 mg total) by mouth 2 (two) times daily. 60 capsule 1   ondansetron (ZOFRAN) 8 MG tablet Take 1 tablet (8 mg total) by mouth every 8 (eight) hours as needed for nausea or vomiting. 90 tablet 1   oxyCODONE (OXY IR/ROXICODONE) 5 MG immediate release tablet Take 1 tablet (5 mg total) by mouth every 4 (four) hours as needed for severe pain.     pantoprazole (PROTONIX) 40 MG tablet Take 1 tablet (40 mg total) by  mouth daily. 30 tablet 2   polyethylene glycol (MIRALAX / GLYCOLAX) 17 g packet Take 17 g by mouth 2 (two) times daily.     prochlorperazine (COMPAZINE) 10 MG tablet Take 1 tablet (10 mg total) by mouth every 6 (six) hours as needed for nausea or vomiting. 30 tablet 2   senna-docusate (SENOKOT-S) 8.6-50 MG tablet Take 2 tablets by mouth 2 (two) times daily. 100 tablet 3   No current facility-administered medications for this visit.   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 Pan-endoscopy elsewhere showed cancer from nasopharyngeal space.   01/04/2007 - 02/20/2007 Chemotherapy   He received 2 cycles of cisplatin and 5FU followed by concurrent chemo with weekly cisplatin and radiation. He only received 2 doses of chemo due to severe mucositis, nausea and weight loss.   04/05/2007 - 08/04/2007 Chemotherapy   He received 4 more courses of cisplatin with 5FU and had complete response   07/05/2009 Procedure   Fine-needle aspirate of the right  level II lymph nodes come from recurrent metastatic disease. Repeat endoscopy and CT scan show no evidence of disease elsewhere.   07/08/2009 - 12/02/2009 Chemotherapy   He was given 6 cycles of carboplatin, 5-FU and docetaxel   12/03/2009 Surgery   He has surgery to the residual lymph node on the right neck which showed no evidence of disease.   02/22/2012 Imaging   Repeat imaging study showed large recurrent mass. He was referred elsewhere for further treatment.   05/03/2012 Surgery   He underwent left upper lobectomy.   04/29/2013 Imaging   PEt scan showed lesion on right level II B and lower lung was abnormal   06/03/2013 - 02/02/2014 Chemotherapy   He had 6 cycles of chemotherapy when he was found to have recurrence of cancer and had received oxaliplatin and capecitabine   06/07/2014 Imaging   PET CT scan showed persistent disease in the right neck lymph nodes and left lung   06/29/2014 Procedure   Accession: [id number] repeat LUL biopsy confirmed metastatic cancer   07/18/2014 - 07/31/2014 Radiation Therapy   He received palliative radiation therapy to the lungs   10/10/2014 Imaging   CT scan of the chest, abdomen and pelvis show regression in the size of the lung nodule in the left upper lobe and stable pulmonary nodules   01/24/2015 Imaging   CT scan showed stable disease in neck and lung   06/19/2015 Imaging   CT scan of the neck and the chest show possible mild progression of the nodule in the right side of the neck.   06/25/2015 Imaging   PET scan confirmed disease recurrence in the neck   07/07/2015 Imaging   He had MRI neck [facility]   09/03/2015 - 08/26/2018 Chemotherapy   He received palliative chemo with Nivolumab   10/29/2015 Imaging   PET CT showed positive response to Rx   02/28/2016 Imaging   Ct abdomen showed abnormal thinkening in his stomach   03/03/2016 Imaging   CT: Right sternocleidomastoid muscle metastasis appears less distinct but otherwise not significantly  changed in size or configuration since 06/19/2015.2. Left level 3 lymph node which was hypermetabolic by PET-CT in January 2017 appears slightly smaller   04/01/2016 Imaging   CT cervical spine showed no acute fracture or traumatic malalignment in the cervical spine   04/22/2016 Procedure   Port-a-cath placed.   06/16/2016 Imaging   Ct neck showed right sternocleidomastoid muscle metastasis is further decreased in conspicuity since May, and has mildly decreased in size since September 2016. Continued stability of sub-centimeter left cervical lymph nodes. No new or progressive metastatic disease in the neck.   06/16/2016 Imaging   CT  chest showed stable masslike radiation fibrosis in the left upper lobe. Stable subcentimeter pulmonary nodules in the bilateral lower lobes. No new or progressive metastatic disease in the chest. Nonobstructing left renal stone.   10/13/2016 Imaging   Ct neck showed unchanged right sternocleidomastoid muscle metastasis. Unchanged subcentimeter left cervical lymph nodes. No evidence of new or progressive metastatic disease in the neck.   10/13/2016 Imaging   CT chest showed tiny hypervascular foci in the liver, not definitely seen on prior imaging of 06/16/2016 and 02/28/2016. Abdomen MRI without and with contrast recommended to further evaluate as metastatic disease is a concern. 2. Stable appearance of post treatment changes left upper lung and scattered tiny bilateral pulmonary nodules.   02/11/2017 Imaging   Ct neck: Lymph node mass right posterior neck appears improved from the prior study. Small posterior lymph nodes on the left unchanged. Occluded right jugular vein unchanged.   02/11/2017 Imaging   1. Similar appearance of postsurgical and radiation changes in the left upper lobe. 2. Similar bilateral pulmonary nodules. 3. No thoracic adenopathy. 4. Subtle foci of post-contrast enhancement within the liver are suboptimally characterized on this nondedicated  study. Likely similar. These could either be re-evaluated at followup or more entirely characterized with abdominal MRI. 5. Left nephrolithiasis.   05/19/2017 Imaging   Matted lymph node mass right posterior neck appears larger in the recent CT. Accurate measurements difficult due to infiltrating tumor margins and infiltration of the muscle. Right jugular vein again appears occluded or resected. Small left posterior lymph nodes stable. Left upper lobe airspace density stable and similar to the prior CT   06/03/2017 PET scan   1. Hypermetabolic ill-defined right level IIb lymph node, about 1.3 cm in diameter with maximum SUV 9.5 (formerly 8.1). Appearance suspicious for residual/recurrent malignancy. No worrisome left-sided lesion. 2. Left suprahilar indistinct opacity demonstrates no worrisome hypermetabolic activity. The 5 mm left lower lobe pulmonary nodule is stable and not currently hypermetabolic although below sensitive PET-CT size thresholds. 3. Other imaging findings of potential clinical significance: Bilateral nonobstructive nephrolithiasis. Chronic bilateral maxillary sinusitis.   05/11/2018 PET scan   1. Continued chronic accentuated metabolic activity in the vicinity of right level IIB and the adjacent right sternocleidomastoid muscle, with ill definition of surrounding tissue planes. Maximum SUV is currently 8.1, formerly 9.5. Accentuated metabolic activity is been present in this vicinity back through 06/25/2015, and there was also some low-level activity in this vicinity on 06/07/2014. Some of this may be from scarring and local muscular activity although clearly a component of residual tumor is difficult to exclude given the focally high activity. 2. Other imaging findings of potential clinical significance: Chronic bilateral maxillary sinusitis. Chronic scarring in the left upper lobe. Chronically stable 5 mm left lower lobe nodule is considered benign. Nonobstructive left  nephrolithiasis.   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 COMMENT:No significant keratinization is identified. Other basaloid carcinomas are in the differential diagnosis. No cell block material is available for further testing.   09/12/2018 Procedure   He underwent fine Needle Aspiration   10/04/2018 PET scan   1. Significant progression of local recurrence laterally in the mid right neck with an enlarging, increasingly hypermetabolic soft tissue mass. This involves the right sternocleidomastoid muscle. 2. Small lymph nodes in the right axilla are increasingly hypermetabolic. These are nonspecific and potentially reactive, although could reflect  a small metastases. Small hypermetabolic nodule in the left suprasternal notch is unchanged. 3. No other evidence of metastatic disease.     10/07/2018 - 12/23/2018 Chemotherapy   The patient had cisplatin plus gemzar   12/07/2018 Imaging   1. Decreased size of lateral right neck mass. 2. Unchanged soft tissue nodule in the suprasternal notch. 3. No evidence of new metastatic disease in the neck.     05/30/2019 Imaging   CT neck No clear change or progression compared to the study of March. Overall measurements of the right lateral neck mass are similar, approximately 3 x 1.8 cm. See above discussion. One could argue that there is slight increase in lateral bulging, possibly with an increase in contrast enhancement, towards the inferior margin. This is of questionable validity but could possibly represent some progression or inflammatory change. Other findings in the region are stable.   07/20/2019 - 09/15/2019 Chemotherapy   The patient had gemzar and cisplatin   10/02/2019 Imaging   CT neck As compared to 05/30/2019, no significant interval change in size of an ill-defined mass within the right  lateral neck, again measuring 3.3 x 1.8 cm in transaxial dimensions.   Unchanged mildly enlarged left level I lymph node measuring 1.1 cm in short axis.   Unchanged node or nodule at the thoracic inlet, measuring 1.3 x 0.8 cm.   Please refer to concurrently performed chest CT for a description of findings below the level of the thoracic inlet.     10/02/2019 Imaging   CT chest 1. No new or progressive findings in the chest to suggest metastatic disease. 2. Bilateral subcentimeter solid pulmonary nodules are stable since 2018. 3. Hyperdense 1.1 cm anterior liver focus, not clearly visualized on prior studies. Suggest MRI abdomen without and with IV contrast for further characterization.   10/20/2019 - 12/29/2019 Chemotherapy   The patient had gemzar maintenance   06/12/2020 Imaging   1. Enlarging superficial, exophytic component of the chronic right sternocleidomastoid muscle mass. See series 6, image 55. 2. Elsewhere stable CT appearance of the Neck.   06/12/2020 Imaging   Post treatment scarring in the left hemithorax, stable. No evidence recurrent or metastatic disease   06/14/2020 - 10/15/2020 Chemotherapy   He received carboplatin, 5FU and Rande Lawman       10/31/2020 Procedure   Interval improvement in right lateral lymph node mass. Improvement in dermal component as well as invasion of the right sternocleidomastoid muscle.   10 mm submental lymph node slightly enlarged compared to the prior study. Continued follow-up recommended.   11/01/2020 - 05/22/2022 Chemotherapy   Patient is on Treatment Plan : HEAD/NECK Pembrolizumab Q21D     07/03/2022 - 10/30/2022 Chemotherapy   Patient is on Treatment Plan : Head and neck Pembrolizumab (400) q42d     11/30/2022 Imaging   CT chest  1. Stable exam. No new or progressive findings to suggest recurrent or metastatic disease. 2. Stable tiny bilateral nodules since 06/12/2020, consistent with benign etiology. 3. Punctate nonobstructing left  renal stone.   12/01/2022 Imaging   CT neck  Growing right neck mass since 2022 with epidural tumor extension via the right C2-3 foramen. Cervical MRI with contrast would be contributory.   12/11/2022 - 04/02/2023 Chemotherapy   Patient is on Treatment Plan : HEAD/NECK toripalimab-tpzi D1 + cisplatin D1 + gemcitabine D1,8 q21d x 6 cycles / toripalimab-tpzi q21d (up to 66m)     04/15/2023 Imaging   CT Chest W Contrast  Result Date: 04/15/2023 CLINICAL  DATA:  Right neck mass, nasopharyngeal carcinoma EXAM: CT CHEST WITH CONTRAST TECHNIQUE: Multidetector CT imaging of the chest was performed during intravenous contrast administration. RADIATION DOSE REDUCTION: This exam was performed according to the departmental dose-optimization program which includes automated exposure control, adjustment of the mA and/or kV according to patient size and/or use of iterative reconstruction technique. CONTRAST:  75mL OMNIPAQUE IOHEXOL 300 MG/ML  SOLN COMPARISON:  11/27/2022 FINDINGS: Cardiovascular: Right Port-A-Cath tip: Right atrium. Mediastinum/Nodes: Small left supraclavicular lymph node 0.5 cm in short axis on image 4 series 3, formerly the same. No pathologic adenopathy in the chest. Lungs/Pleura: Biapical pleuroparenchymal scarring, right greater than left. Stable left upper lobe scarring and peribronchovascular density with wedge resection line in the left upper lobe, no significant contour change of density in this vicinity to suggest malignancy. Stable 5 by 4 mm left lower lobe nodule and stable 3 by 4 mm right lower lobe nodule, unchanged from at least 2021, compatible with benign etiology. No new lesion identified. Upper Abdomen: 2 mm left kidney upper pole nonobstructive renal calculus, image 43 series 7. Musculoskeletal: Unremarkable IMPRESSION: 1. Stable postoperative findings in the left upper lobe. 2. Stable small bilateral lower lobe pulmonary nodules, unchanged from at least 2021, compatible with benign  etiology. 3. 2 mm left kidney upper pole nonobstructive renal calculus. Electronically Signed   By: Gaylyn Rong M.D.   On: 04/15/2023 14:03   CT Soft Tissue Neck W Contrast  Result Date: 04/15/2023 CLINICAL DATA:  Head/neck cancer.  Last chemo 04/02/2023. EXAM: CT NECK WITH CONTRAST TECHNIQUE: Multidetector CT imaging of the neck was performed using the standard protocol following the bolus administration of intravenous contrast. RADIATION DOSE REDUCTION: This exam was performed according to the departmental dose-optimization program which includes automated exposure control, adjustment of the mA and/or kV according to patient size and/or use of iterative reconstruction technique. CONTRAST:  75mL OMNIPAQUE IOHEXOL 300 MG/ML  SOLN COMPARISON:  CT neck 11/27/2022. FINDINGS: Pharynx and larynx: There is probable mucoid debris in the right nasal cavity/nasopharynx. The nasal cavity and nasopharynx are otherwise unremarkable. The oral cavity and oropharynx are unremarkable. The parapharyngeal spaces are clear. The hypopharynx and larynx are unremarkable. The epiglottis is normal. There is no retropharyngeal collection. The airway is patent. Salivary glands: Atrophy of the parotid and left submandibular glands is unchanged. The right submandibular gland is again not identified and may be surgically absent. Thyroid: Unremarkable. Lymph nodes: The infiltrative mass in the right neck is again seen. The mass invades through the right C2-C3 neural foramen into the epidural space resulting in mild narrowing of the thecal sac without suspected cord compression. The degree of intraspinal epidural tumor appears slightly increased compared to the study from 11/27/2022. Mass again encases the vertebral artery at the C2-C3 level (4-36), unchanged. The remainder of the infiltrative mass in the paraspinal musculature with extension into the overlying skin surface does not appear significantly changed in size or extent  compared to the prior study. A 0.9 cm midline level I lymph node is unchanged going back to 2021. Otherwise, there is no new or progressive lymphadenopathy in the neck. Vascular: As above, there is encasement of the right vertebral artery at the C2-C3 level by the right neck mass. There is mild calcified plaque at the carotid bifurcations. The right IJ is occluded or sacrificed, unchanged. The left IJ is patent. A right chest wall port is partially imaged. Limited intracranial: Unremarkable. Visualized orbits: Unremarkable. Mastoids and visualized paranasal sinuses: There is mild  mucosal thickening in the maxillary sinuses. The mastoid air cells and middle ear cavities are clear. Skeleton: There is no acute osseous abnormality or suspicious osseous lesion. Upper chest: Assessed on the separately dictated CT chest. Other: None. IMPRESSION: 1. Infiltrative mass again seen in the right neck centered in the paraspinal musculature with invasion through the right C2-C3 neural foramen. The bulk of the epidural tumor at this level appears slightly increased in size compared to the prior study from 11/27/2022 with probable leftward displacement of the cord but without frank cord compression. The remainder of the mass in the right neck is otherwise not significantly changed. 2. No new or progressive lymphadenopathy in the remainder of the neck. 3. Probable mucoid debris in the right nasal cavity/nasopharynx. No suspicious mass lesion or enhancement in the nasopharynx. Electronically Signed   By: Lesia Hausen M.D.   On: 04/15/2023 14:02      05/07/2023 -  Chemotherapy   Patient is on Treatment Plan : HEAD/NECK Cetuximab q14d + Carboplatin + 5FU IVCI D1-4 q21d x 6 cycles / Cetuximab q14d       PHYSICAL EXAMINATION: ECOG PERFORMANCE STATUS: 1 - Symptomatic but completely ambulatory  Vitals:   07/09/23 1111  BP: (!) 136/90  Pulse: 89  Resp: 18  Temp: 99 F (37.2 C)  SpO2: 100%   Filed Weights   07/09/23 1111   Weight: 182 lb 12.8 oz (82.9 kg)    GENERAL:alert, no distress and comfortable SKIN: Noted diffuse acneform rash on his face   NECK: Neck lesion remain flat  LABORATORY DATA:  I have reviewed the data as listed    Component Value Date/Time   NA 140 07/09/2023 1044   NA 139 09/22/2017 0829   K 3.3 (L) 07/09/2023 1044   K 3.5 09/22/2017 0829   CL 103 07/09/2023 1044   CO2 30 07/09/2023 1044   CO2 26 09/22/2017 0829   GLUCOSE 177 (H) 07/09/2023 1044   GLUCOSE 133 09/22/2017 0829   BUN 12 07/09/2023 1044   BUN 14.1 09/22/2017 0829   CREATININE 0.76 07/09/2023 1044   CREATININE 0.9 09/22/2017 0829   CALCIUM 9.2 07/09/2023 1044   CALCIUM 9.1 09/22/2017 0829   PROT 6.9 07/09/2023 1044   PROT 6.8 09/22/2017 0829   ALBUMIN 4.0 07/09/2023 1044   ALBUMIN 4.1 09/22/2017 0829   AST 20 07/09/2023 1044   AST 22 09/22/2017 0829   ALT 18 07/09/2023 1044   ALT 30 09/22/2017 0829   ALKPHOS 92 07/09/2023 1044   ALKPHOS 55 09/22/2017 0829   BILITOT 0.3 07/09/2023 1044   BILITOT 0.35 09/22/2017 0829   GFRNONAA >60 07/09/2023 1044   GFRAA >60 07/05/2020 0845   GFRAA >60 02/06/2019 1215    No results found for: "SPEP", "UPEP"  Lab Results  Component Value Date   WBC 3.3 (L) 07/09/2023   NEUTROABS 1.0 (L) 07/09/2023   HGB 11.4 (L) 07/09/2023   HCT 35.2 (L) 07/09/2023   MCV 90.5 07/09/2023   PLT 198 07/09/2023      Chemistry      Component Value Date/Time   NA 140 07/09/2023 1044   NA 139 09/22/2017 0829   K 3.3 (L) 07/09/2023 1044   K 3.5 09/22/2017 0829   CL 103 07/09/2023 1044   CO2 30 07/09/2023 1044   CO2 26 09/22/2017 0829   BUN 12 07/09/2023 1044   BUN 14.1 09/22/2017 0829   CREATININE 0.76 07/09/2023 1044   CREATININE 0.9 09/22/2017 0829  Component Value Date/Time   CALCIUM 9.2 07/09/2023 1044   CALCIUM 9.1 09/22/2017 0829   ALKPHOS 92 07/09/2023 1044   ALKPHOS 55 09/22/2017 0829   AST 20 07/09/2023 1044   AST 22 09/22/2017 0829   ALT 18 07/09/2023  1044   ALT 30 09/22/2017 0829   BILITOT 0.3 07/09/2023 1044   BILITOT 0.35 09/22/2017 0829

## 2023-07-13 NOTE — Progress Notes (Unsigned)
FMLA forms completed for Spouse, Nimo, as requested by Patient. Fax transmission confirmation received. Copy of forms emailed to Spouse as requested. No other needs or concerns noted at this time.

## 2023-07-14 ENCOUNTER — Other Ambulatory Visit: Payer: Self-pay

## 2023-07-16 ENCOUNTER — Encounter: Payer: Self-pay | Admitting: Hematology and Oncology

## 2023-07-16 ENCOUNTER — Inpatient Hospital Stay: Payer: Medicaid Other

## 2023-07-16 ENCOUNTER — Other Ambulatory Visit (HOSPITAL_COMMUNITY): Payer: Self-pay

## 2023-07-16 ENCOUNTER — Inpatient Hospital Stay: Payer: Medicaid Other | Admitting: Hematology and Oncology

## 2023-07-16 DIAGNOSIS — C760 Malignant neoplasm of head, face and neck: Secondary | ICD-10-CM

## 2023-07-16 DIAGNOSIS — Z7189 Other specified counseling: Secondary | ICD-10-CM

## 2023-07-16 DIAGNOSIS — Z5112 Encounter for antineoplastic immunotherapy: Secondary | ICD-10-CM | POA: Diagnosis not present

## 2023-07-16 LAB — BASIC METABOLIC PANEL - CANCER CENTER ONLY
Anion gap: 6 (ref 5–15)
BUN: 10 mg/dL (ref 6–20)
CO2: 29 mmol/L (ref 22–32)
Calcium: 9.3 mg/dL (ref 8.9–10.3)
Chloride: 105 mmol/L (ref 98–111)
Creatinine: 0.68 mg/dL (ref 0.61–1.24)
GFR, Estimated: >60 mL/min (ref >60)
Glucose, Bld: 127 mg/dL — ABNORMAL HIGH (ref 70–99)
Potassium: 3.5 mmol/L (ref 3.5–5.1)
Sodium: 140 mmol/L (ref 135–145)

## 2023-07-16 LAB — CBC WITH DIFFERENTIAL/PLATELET
Abs Immature Granulocytes: 0.05 10*3/uL (ref 0.00–0.07)
Basophils Absolute: 0 10*3/uL (ref 0.0–0.1)
Basophils Relative: 1 %
Eosinophils Absolute: 0.1 10*3/uL (ref 0.0–0.5)
Eosinophils Relative: 3 %
HCT: 35.9 % — ABNORMAL LOW (ref 39.0–52.0)
Hemoglobin: 11.3 g/dL — ABNORMAL LOW (ref 13.0–17.0)
Immature Granulocytes: 1 %
Lymphocytes Relative: 35 %
Lymphs Abs: 1.6 10*3/uL (ref 0.7–4.0)
MCH: 29 pg (ref 26.0–34.0)
MCHC: 31.5 g/dL (ref 30.0–36.0)
MCV: 92.3 fL (ref 80.0–100.0)
Monocytes Absolute: 0.6 10*3/uL (ref 0.1–1.0)
Monocytes Relative: 12 %
Neutro Abs: 2.2 10*3/uL (ref 1.7–7.7)
Neutrophils Relative %: 48 %
Platelets: 322 10*3/uL (ref 150–400)
RBC: 3.89 MIL/uL — ABNORMAL LOW (ref 4.22–5.81)
RDW: 18.2 % — ABNORMAL HIGH (ref 11.5–15.5)
WBC: 4.5 10*3/uL (ref 4.0–10.5)
nRBC: 0 % (ref 0.0–0.2)

## 2023-07-16 LAB — MAGNESIUM: Magnesium: 1.7 mg/dL (ref 1.7–2.4)

## 2023-07-16 MED ORDER — EMPTY CONTAINERS FLEXIBLE MISC
300.0000 mg/m2 | Freq: Once | Status: AC
  Administered 2023-07-16: 600 mg via INTRAVENOUS
  Filled 2023-07-16: qty 300

## 2023-07-16 MED ORDER — DIPHENHYDRAMINE HCL 50 MG/ML IJ SOLN
50.0000 mg | Freq: Once | INTRAMUSCULAR | Status: AC
  Administered 2023-07-16: 50 mg via INTRAVENOUS
  Filled 2023-07-16: qty 1

## 2023-07-16 MED ORDER — OXYCODONE HCL 10 MG PO TABS
10.0000 mg | ORAL_TABLET | ORAL | 0 refills | Status: DC | PRN
  Filled 2023-07-16: qty 90, 15d supply, fill #0

## 2023-07-16 MED ORDER — SODIUM CHLORIDE 0.9% FLUSH
10.0000 mL | INTRAVENOUS | Status: DC | PRN
  Administered 2023-07-16: 10 mL

## 2023-07-16 MED ORDER — FAMOTIDINE IN NACL 20-0.9 MG/50ML-% IV SOLN
20.0000 mg | Freq: Once | INTRAVENOUS | Status: AC
  Administered 2023-07-16: 20 mg via INTRAVENOUS
  Filled 2023-07-16: qty 50

## 2023-07-16 MED ORDER — SODIUM CHLORIDE 0.9 % IV SOLN: Freq: Once | INTRAVENOUS | Status: AC

## 2023-07-16 NOTE — Patient Instructions (Signed)
[facility] [place] [facility]  Discharge Instructions: Thank you for choosing [facility] to provide your oncology and hematology care.   If you have a lab appointment with the Cancer Center, please go directly to the Cancer Center and check in at the registration area.   Wear comfortable clothing and clothing appropriate for easy access to any Portacath or PICC line.   We strive to give you quality time with your provider. You may need to reschedule your appointment if you arrive late (15 or more minutes).  Arriving late affects you and other patients whose appointments are after yours.  Also, if you miss three or more appointments without notifying the office, you may be dismissed from the clinic at the provider's discretion.      For prescription refill requests, have your pharmacy contact our office and allow 72 hours for refills to be completed.    Today you received the following chemotherapy and/or immunotherapy agents erbitux      To help prevent nausea and vomiting after your treatment, we encourage you to take your nausea medication as directed.  BELOW ARE SYMPTOMS THAT SHOULD BE REPORTED IMMEDIATELY: *FEVER GREATER THAN 100.4 F (38 C) OR HIGHER *CHILLS OR SWEATING *NAUSEA AND VOMITING THAT IS NOT CONTROLLED WITH YOUR NAUSEA MEDICATION *UNUSUAL SHORTNESS OF BREATH *UNUSUAL BRUISING OR BLEEDING *URINARY PROBLEMS (pain or burning when urinating, or frequent urination) *BOWEL PROBLEMS (unusual diarrhea, constipation, pain near the anus) TENDERNESS IN MOUTH AND THROAT WITH OR WITHOUT PRESENCE OF ULCERS (sore throat, sores in mouth, or a toothache) UNUSUAL RASH, SWELLING OR PAIN  UNUSUAL VAGINAL DISCHARGE OR ITCHING   Items with * indicate a potential emergency and should be followed up as soon as possible or go to the Emergency Department if any problems should occur.  Please show the CHEMOTHERAPY ALERT CARD or IMMUNOTHERAPY ALERT CARD at check-in  to the Emergency Department and triage nurse.  Should you have questions after your visit or need to cancel or reschedule your appointment, please contact [facility]  Dept: [phone]  and follow the prompts.  Office hours are 8:00 a.m. to 4:30 p.m. Monday - Friday. Please note that voicemails left after 4:00 p.m. may not be returned until the following business day.  We are closed weekends and major holidays. You have access to a nurse at all times for urgent questions. Please call the main number to the clinic Dept: [phone] and follow the prompts.   For any non-urgent questions, you may also contact your provider using MyChart. We now offer e-Visits for anyone 65 and older to request care online for non-urgent symptoms. For details visit [email or web address].   Also download the MyChart app! Go to the app store, search "MyChart", open the app, select [place], and log in with your MyChart username and password.

## 2023-07-16 NOTE — Progress Notes (Signed)
[facility] OFFICE PROGRESS NOTE  Patient Care Team: Artis Delay, MD as PCP - General (Hematology and Oncology) Artis Delay, MD as Consulting Physician (Hematology and Oncology) Elfredia Nevins, MD as Referring Physician (Plastic Surgery) Hilarie Fredrickson, MD as Consulting Physician (Gastroenterology)  HISTORY OF PRESENTING ILLNESS: Discussed the use of AI scribe software for clinical note transcription with the patient, who gave verbal consent to proceed.  History of Present Illness   The patient, with a history of cancer and associated neck scarring from surgery and radiation, presents with new pain radiating down the neck. He describes the pain as significant enough to require an increase in his oxycodone dosage from 5mg  to 10mg . The patient also reports some mouth sores, though he is significantly improved from a previous state.  Despite the pain, the patient has been managing well with his constipation, taking Miralax consistently and reporting regular bowel movements. He has also been on antibiotics for his skin rash and has stopped using a previously prescribed gel. The patient anticipates a flare of his condition after his next Erbitux treatment.  In addition to the oxycodone, the patient has been taking sodium naproxen for pain management, though he is aware of the potential for stomach upset. He also notes that his posture has been affected by the scarring and cancer on his neck, which contributes to his pain.         Assessment and Plan    Head and Neck Cancer Noted increased pain likely due to tumor growth and scarring from previous surgery and radiation. Discussed the importance of posture and movement to manage pain. -Continue Erbitux treatment. -Refill Oxycodone 10mg  for pain management.  Constipation No current issues, patient is consistent with Miralax use. -Continue current regimen.  Oral Ulcers Mild, significantly improved. -Continue current  management.  Skin Condition Anticipated flare after Erbitux treatment. -Continue antibiotics, start tomorrow. -Call for refill if antibiotics run out.  Follow-up appointments already scheduled. Next appointment in two weeks.          No orders of the defined types were placed in this encounter.   All questions were answered. The patient knows to call the clinic with any problems, questions or concerns. The total time spent in the appointment was 30 minutes encounter with patients including review of chart and various tests results, discussions about plan of care and coordination of care plan   Artis Delay, MD 07/16/2023 12:21 PM  REVIEW OF SYSTEMS:  All other systems were reviewed with the patient and are negative.  I have reviewed the past medical history, past surgical history, social history and family history with the patient and they are unchanged from previous note.  ALLERGIES:  is allergic to phenergan [promethazine hcl], heparin, and clindamycin.  MEDICATIONS:  Current Outpatient Medications  Medication Sig Dispense Refill   bisacodyl (DULCOLAX) 10 MG suppository Place 1 suppository (10 mg total) rectally as needed for moderate constipation. 4 suppository 0   blood glucose meter kit and supplies KIT Dispense based on patient and insurance preference. Use up to four times daily as directed. (FOR ICD-9 250.00, 250.01). 1 each 1   cetirizine (ZYRTEC) 10 MG tablet Take 1 tablet (10 mg total) by mouth daily as needed for allergies. 100 tablet 0   dexamethasone (DECADRON) 4 MG tablet Take 1 tablet (4 mg total) by mouth daily for 3 days after chemo, every 2 weeks. 30 tablet 0   famotidine (PEPCID) 40 MG tablet Take 1 tablet (40 mg  total) by mouth at bedtime. 30 tablet 3   levothyroxine (SYNTHROID) 175 MCG tablet Take 1 tablet (175 mcg total) by mouth daily before breakfast. 30 tablet 3   LORazepam (ATIVAN) 0.5 MG tablet Take 1 tablet (0.5 mg total) by mouth 2 (two) times daily as  needed for anxiety. 60 tablet 0   magic mouthwash w/lidocaine SOLN Take 5 mLs by mouth 4 (four) times daily. 5 mls 4 x a day for 10 days, swish and spit. 240 mL 0   metFORMIN (GLUCOPHAGE) 500 MG tablet Take 1 tablet (500 mg total) by mouth 2 (two) times daily with a meal. 60 tablet 2   methadone (DOLOPHINE) 10 MG tablet Take 1 tablet (10 mg) by mouth every 8 hours. 90 tablet 0   minocycline (MINOCIN) 100 MG capsule Take 1 capsule (100 mg total) by mouth 2 (two) times daily. 60 capsule 1   ondansetron (ZOFRAN) 8 MG tablet Take 1 tablet (8 mg total) by mouth every 8 (eight) hours as needed for nausea or vomiting. 90 tablet 1   Oxycodone HCl 10 MG TABS Take 1 tablet (10 mg total) by mouth every 4 (four) hours as needed for severe pain. 90 tablet 0   pantoprazole (PROTONIX) 40 MG tablet Take 1 tablet (40 mg total) by mouth daily. 30 tablet 2   polyethylene glycol (MIRALAX / GLYCOLAX) 17 g packet Take 17 g by mouth 2 (two) times daily.     prochlorperazine (COMPAZINE) 10 MG tablet Take 1 tablet (10 mg total) by mouth every 6 (six) hours as needed for nausea or vomiting. 30 tablet 2   senna-docusate (SENOKOT-S) 8.6-50 MG tablet Take 2 tablets by mouth 2 (two) times daily. 100 tablet 3   No current facility-administered medications for this visit.   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 Pan-endoscopy elsewhere showed cancer from nasopharyngeal space.   01/04/2007 - 02/20/2007 Chemotherapy   He received 2 cycles of cisplatin and 5FU followed by concurrent chemo with weekly cisplatin and radiation. He only received 2 doses of chemo due to severe mucositis, nausea and weight loss.   04/05/2007 - 08/04/2007 Chemotherapy   He received 4 more courses of cisplatin with 5FU and had complete response   07/05/2009 Procedure   Fine-needle aspirate of the right level II lymph nodes come from recurrent metastatic disease. Repeat endoscopy and CT scan show no evidence of disease elsewhere.   07/08/2009 - 12/02/2009 Chemotherapy   He was given 6 cycles of carboplatin, 5-FU and docetaxel   12/03/2009 Surgery  He has surgery to the residual lymph node on the right neck which showed no evidence of disease.   02/22/2012 Imaging   Repeat imaging study showed large recurrent mass. He was referred elsewhere for further treatment.   05/03/2012 Surgery   He underwent left upper lobectomy.   04/29/2013 Imaging   PEt scan showed lesion on right level II B and lower lung was abnormal   06/03/2013 - 02/02/2014 Chemotherapy   He had 6 cycles of chemotherapy when he was found to have recurrence of cancer and had received oxaliplatin and capecitabine   06/07/2014 Imaging   PET CT scan showed persistent disease in the right neck lymph nodes and left lung   06/29/2014 Procedure   Accession: [id number] repeat LUL biopsy confirmed metastatic cancer   07/18/2014 - 07/31/2014 Radiation Therapy   He received palliative radiation therapy to the lungs   10/10/2014 Imaging   CT scan of the chest, abdomen and pelvis show regression in  the size of the lung nodule in the left upper lobe and stable pulmonary nodules   01/24/2015 Imaging   CT scan showed stable disease in neck and lung   06/19/2015 Imaging   CT scan of the neck and the chest show possible mild progression of the nodule in the right side of the neck.   06/25/2015 Imaging   PET scan confirmed disease recurrence in the neck   07/07/2015 Imaging   He had MRI neck at [facility]   09/03/2015 - 08/26/2018 Chemotherapy   He received palliative chemo with Nivolumab   10/29/2015 Imaging   PET CT showed positive response to Rx   02/28/2016 Imaging   Ct abdomen showed abnormal thinkening in his stomach   03/03/2016 Imaging   CT: Right sternocleidomastoid muscle metastasis appears less distinct but otherwise not significantly changed in size or configuration since 06/19/2015.2. Left level 3 lymph node which was hypermetabolic by PET-CT in January 2017 appears slightly smaller   04/01/2016 Imaging   CT cervical spine showed no acute fracture or traumatic malalignment in the cervical spine   04/22/2016 Procedure   Port-a-cath placed.   06/16/2016 Imaging   Ct neck showed right sternocleidomastoid muscle metastasis is further decreased in conspicuity since May, and has mildly decreased in size since September 2016. Continued stability of sub-centimeter left cervical lymph nodes. No new or progressive metastatic disease in the neck.   06/16/2016 Imaging   CT chest showed stable masslike radiation fibrosis in the left upper lobe. Stable subcentimeter pulmonary nodules in the bilateral lower lobes. No new or progressive metastatic disease in the chest. Nonobstructing left renal stone.   10/13/2016 Imaging   Ct neck showed unchanged right sternocleidomastoid muscle metastasis. Unchanged subcentimeter left cervical lymph nodes. No evidence of new or progressive metastatic disease in the neck.   10/13/2016 Imaging   CT chest showed tiny hypervascular foci in the liver, not definitely  seen on prior imaging of 06/16/2016 and 02/28/2016. Abdomen MRI without and with contrast recommended to further evaluate as metastatic disease is a concern. 2. Stable appearance of post treatment changes left upper lung and scattered tiny bilateral pulmonary nodules.   02/11/2017 Imaging   Ct neck: Lymph node mass right posterior neck appears improved from the prior study. Small posterior lymph nodes on the left unchanged. Occluded right jugular vein unchanged.   02/11/2017 Imaging   1. Similar appearance of postsurgical and radiation changes in the left upper lobe. 2. Similar bilateral pulmonary nodules. 3. No thoracic adenopathy. 4. Subtle  foci of post-contrast enhancement within the liver are suboptimally characterized on this nondedicated study. Likely similar. These could either be re-evaluated at followup or more entirely characterized with abdominal MRI. 5. Left nephrolithiasis.   05/19/2017 Imaging   Matted lymph node mass right posterior neck appears larger in the recent CT. Accurate measurements difficult due to infiltrating tumor margins and infiltration of the muscle. Right jugular vein again appears occluded or resected. Small left posterior lymph nodes stable. Left upper lobe airspace density stable and similar to the prior CT   06/03/2017 PET scan   1. Hypermetabolic ill-defined right level IIb lymph node, about 1.3 cm in diameter with maximum SUV 9.5 (formerly 8.1). Appearance suspicious for residual/recurrent malignancy. No worrisome left-sided lesion. 2. Left suprahilar indistinct opacity demonstrates no worrisome hypermetabolic activity. The 5 mm left lower lobe pulmonary nodule is stable and not currently hypermetabolic although below sensitive PET-CT size thresholds. 3. Other imaging findings of potential clinical significance: Bilateral nonobstructive nephrolithiasis. Chronic bilateral maxillary sinusitis.   05/11/2018 PET scan   1. Continued chronic accentuated metabolic  activity in the vicinity of right level IIB and the adjacent right sternocleidomastoid muscle, with ill definition of surrounding tissue planes. Maximum SUV is currently 8.1, formerly 9.5. Accentuated metabolic activity is been present in this vicinity back through 06/25/2015, and there was also some low-level activity in this vicinity on 06/07/2014. Some of this may be from scarring and local muscular activity although clearly a component of residual tumor is difficult to exclude given the focally high activity. 2. Other imaging findings of potential clinical significance: Chronic bilateral maxillary sinusitis. Chronic scarring in the left upper lobe. Chronically stable 5 mm left lower lobe nodule is considered benign. Nonobstructive left nephrolithiasis.   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 COMMENT:No significant keratinization is identified. Other basaloid carcinomas are in the differential diagnosis. No cell block material is available for further testing.   09/12/2018 Procedure   He underwent fine Needle Aspiration   10/04/2018 PET scan   1. Significant progression of local recurrence laterally in the mid right neck with an enlarging, increasingly hypermetabolic soft tissue mass. This involves the right sternocleidomastoid muscle. 2. Small lymph nodes in the right axilla are increasingly hypermetabolic. These are nonspecific and potentially reactive, although could reflect a small metastases. Small hypermetabolic nodule in the left suprasternal notch is unchanged. 3. No other evidence of metastatic disease.     10/07/2018 - 12/23/2018 Chemotherapy   The patient had cisplatin plus gemzar   12/07/2018 Imaging   1. Decreased size of lateral right neck mass. 2. Unchanged soft tissue nodule in the suprasternal notch. 3. No evidence of new  metastatic disease in the neck.     05/30/2019 Imaging   CT neck No clear change or progression compared to the study of March. Overall measurements of the right lateral neck mass are similar, approximately 3 x 1.8 cm. See above discussion. One could argue that there is slight increase in lateral bulging, possibly with an increase in contrast enhancement, towards the inferior margin. This is of questionable validity but could possibly represent some progression or inflammatory change. Other findings in the region are stable.   07/20/2019 - 09/15/2019 Chemotherapy   The patient had gemzar and cisplatin   10/02/2019 Imaging   CT neck As compared to 05/30/2019, no significant interval change in size of  an ill-defined mass within the right lateral neck, again measuring 3.3 x 1.8 cm in transaxial dimensions.   Unchanged mildly enlarged left level I lymph node measuring 1.1 cm in short axis.   Unchanged node or nodule at the thoracic inlet, measuring 1.3 x 0.8 cm.   Please refer to concurrently performed chest CT for a description of findings below the level of the thoracic inlet.     10/02/2019 Imaging   CT chest 1. No new or progressive findings in the chest to suggest metastatic disease. 2. Bilateral subcentimeter solid pulmonary nodules are stable since 2018. 3. Hyperdense 1.1 cm anterior liver focus, not clearly visualized on prior studies. Suggest MRI abdomen without and with IV contrast for further characterization.   10/20/2019 - 12/29/2019 Chemotherapy   The patient had gemzar maintenance   06/12/2020 Imaging   1. Enlarging superficial, exophytic component of the chronic right sternocleidomastoid muscle mass. See series 6, image 55. 2. Elsewhere stable CT appearance of the Neck.   06/12/2020 Imaging   Post treatment scarring in the left hemithorax, stable. No evidence recurrent or metastatic disease   06/14/2020 - 10/15/2020 Chemotherapy   He received carboplatin, 5FU and  Rande Lawman       10/31/2020 Procedure   Interval improvement in right lateral lymph node mass. Improvement in dermal component as well as invasion of the right sternocleidomastoid muscle.   10 mm submental lymph node slightly enlarged compared to the prior study. Continued follow-up recommended.   11/01/2020 - 05/22/2022 Chemotherapy   Patient is on Treatment Plan : HEAD/NECK Pembrolizumab Q21D     07/03/2022 - 10/30/2022 Chemotherapy   Patient is on Treatment Plan : Head and neck Pembrolizumab (400) q42d     11/30/2022 Imaging   CT chest  1. Stable exam. No new or progressive findings to suggest recurrent or metastatic disease. 2. Stable tiny bilateral nodules since 06/12/2020, consistent with benign etiology. 3. Punctate nonobstructing left renal stone.   12/01/2022 Imaging   CT neck  Growing right neck mass since 2022 with epidural tumor extension via the right C2-3 foramen. Cervical MRI with contrast would be contributory.   12/11/2022 - 04/02/2023 Chemotherapy   Patient is on Treatment Plan : HEAD/NECK toripalimab-tpzi D1 + cisplatin D1 + gemcitabine D1,8 q21d x 6 cycles / toripalimab-tpzi q21d (up to 20m)     04/15/2023 Imaging   CT Chest W Contrast  Result Date: 04/15/2023 CLINICAL DATA:  Right neck mass, nasopharyngeal carcinoma EXAM: CT CHEST WITH CONTRAST TECHNIQUE: Multidetector CT imaging of the chest was performed during intravenous contrast administration. RADIATION DOSE REDUCTION: This exam was performed according to the departmental dose-optimization program which includes automated exposure control, adjustment of the mA and/or kV according to patient size and/or use of iterative reconstruction technique. CONTRAST:  75mL OMNIPAQUE IOHEXOL 300 MG/ML  SOLN COMPARISON:  11/27/2022 FINDINGS: Cardiovascular: Right Port-A-Cath tip: Right atrium. Mediastinum/Nodes: Small left supraclavicular lymph node 0.5 cm in short axis on image 4 series 3, formerly the same. No pathologic  adenopathy in the chest. Lungs/Pleura: Biapical pleuroparenchymal scarring, right greater than left. Stable left upper lobe scarring and peribronchovascular density with wedge resection line in the left upper lobe, no significant contour change of density in this vicinity to suggest malignancy. Stable 5 by 4 mm left lower lobe nodule and stable 3 by 4 mm right lower lobe nodule, unchanged from at least 2021, compatible with benign etiology. No new lesion identified. Upper Abdomen: 2 mm left kidney upper pole nonobstructive renal calculus, image  43 series 7. Musculoskeletal: Unremarkable IMPRESSION: 1. Stable postoperative findings in the left upper lobe. 2. Stable small bilateral lower lobe pulmonary nodules, unchanged from at least 2021, compatible with benign etiology. 3. 2 mm left kidney upper pole nonobstructive renal calculus. Electronically Signed   By: Gaylyn Rong M.D.   On: 04/15/2023 14:03   CT Soft Tissue Neck W Contrast  Result Date: 04/15/2023 CLINICAL DATA:  Head/neck cancer.  Last chemo 04/02/2023. EXAM: CT NECK WITH CONTRAST TECHNIQUE: Multidetector CT imaging of the neck was performed using the standard protocol following the bolus administration of intravenous contrast. RADIATION DOSE REDUCTION: This exam was performed according to the departmental dose-optimization program which includes automated exposure control, adjustment of the mA and/or kV according to patient size and/or use of iterative reconstruction technique. CONTRAST:  75mL OMNIPAQUE IOHEXOL 300 MG/ML  SOLN COMPARISON:  CT neck 11/27/2022. FINDINGS: Pharynx and larynx: There is probable mucoid debris in the right nasal cavity/nasopharynx. The nasal cavity and nasopharynx are otherwise unremarkable. The oral cavity and oropharynx are unremarkable. The parapharyngeal spaces are clear. The hypopharynx and larynx are unremarkable. The epiglottis is normal. There is no retropharyngeal collection. The airway is patent. Salivary  glands: Atrophy of the parotid and left submandibular glands is unchanged. The right submandibular gland is again not identified and may be surgically absent. Thyroid: Unremarkable. Lymph nodes: The infiltrative mass in the right neck is again seen. The mass invades through the right C2-C3 neural foramen into the epidural space resulting in mild narrowing of the thecal sac without suspected cord compression. The degree of intraspinal epidural tumor appears slightly increased compared to the study from 11/27/2022. Mass again encases the vertebral artery at the C2-C3 level (4-36), unchanged. The remainder of the infiltrative mass in the paraspinal musculature with extension into the overlying skin surface does not appear significantly changed in size or extent compared to the prior study. A 0.9 cm midline level I lymph node is unchanged going back to 2021. Otherwise, there is no new or progressive lymphadenopathy in the neck. Vascular: As above, there is encasement of the right vertebral artery at the C2-C3 level by the right neck mass. There is mild calcified plaque at the carotid bifurcations. The right IJ is occluded or sacrificed, unchanged. The left IJ is patent. A right chest wall port is partially imaged. Limited intracranial: Unremarkable. Visualized orbits: Unremarkable. Mastoids and visualized paranasal sinuses: There is mild mucosal thickening in the maxillary sinuses. The mastoid air cells and middle ear cavities are clear. Skeleton: There is no acute osseous abnormality or suspicious osseous lesion. Upper chest: Assessed on the separately dictated CT chest. Other: None. IMPRESSION: 1. Infiltrative mass again seen in the right neck centered in the paraspinal musculature with invasion through the right C2-C3 neural foramen. The bulk of the epidural tumor at this level appears slightly increased in size compared to the prior study from 11/27/2022 with probable leftward displacement of the cord but without  frank cord compression. The remainder of the mass in the right neck is otherwise not significantly changed. 2. No new or progressive lymphadenopathy in the remainder of the neck. 3. Probable mucoid debris in the right nasal cavity/nasopharynx. No suspicious mass lesion or enhancement in the nasopharynx. Electronically Signed   By: Lesia Hausen M.D.   On: 04/15/2023 14:02      05/07/2023 -  Chemotherapy   Patient is on Treatment Plan : HEAD/NECK Cetuximab q14d + Carboplatin + 5FU IVCI D1-4 q21d x 6 cycles / Cetuximab  q14d       PHYSICAL EXAMINATION: ECOG PERFORMANCE STATUS: 1 - Symptomatic but completely ambulatory  Vitals:   07/16/23 1016  BP: 123/81  Pulse: 78  Resp: 18  Temp: 98 F (36.7 C)  SpO2: 100%   Filed Weights   07/16/23 1016  Weight: 185 lb (83.9 kg)    GENERAL:alert, no distress and comfortable Noted slight enlarged growth on previous documented tumor on the right side of his neck  LABORATORY DATA:  I have reviewed the data as listed    Component Value Date/Time   NA 140 07/16/2023 0941   NA 139 09/22/2017 0829   K 3.5 07/16/2023 0941   K 3.5 09/22/2017 0829   CL 105 07/16/2023 0941   CO2 29 07/16/2023 0941   CO2 26 09/22/2017 0829   GLUCOSE 127 (H) 07/16/2023 0941   GLUCOSE 133 09/22/2017 0829   BUN 10 07/16/2023 0941   BUN 14.1 09/22/2017 0829   CREATININE 0.68 07/16/2023 0941   CREATININE 0.9 09/22/2017 0829   CALCIUM 9.3 07/16/2023 0941   CALCIUM 9.1 09/22/2017 0829   PROT 6.9 07/09/2023 1044   PROT 6.8 09/22/2017 0829   ALBUMIN 4.0 07/09/2023 1044   ALBUMIN 4.1 09/22/2017 0829   AST 20 07/09/2023 1044   AST 22 09/22/2017 0829   ALT 18 07/09/2023 1044   ALT 30 09/22/2017 0829   ALKPHOS 92 07/09/2023 1044   ALKPHOS 55 09/22/2017 0829   BILITOT 0.3 07/09/2023 1044   BILITOT 0.35 09/22/2017 0829   GFRNONAA >60 07/16/2023 0941   GFRAA >60 07/05/2020 0845   GFRAA >60 02/06/2019 1215    No results found for: "SPEP", "UPEP"  Lab Results   Component Value Date   WBC 4.5 07/16/2023   NEUTROABS 2.2 07/16/2023   HGB 11.3 (L) 07/16/2023   HCT 35.9 (L) 07/16/2023   MCV 92.3 07/16/2023   PLT 322 07/16/2023      Chemistry      Component Value Date/Time   NA 140 07/16/2023 0941   NA 139 09/22/2017 0829   K 3.5 07/16/2023 0941   K 3.5 09/22/2017 0829   CL 105 07/16/2023 0941   CO2 29 07/16/2023 0941   CO2 26 09/22/2017 0829   BUN 10 07/16/2023 0941   BUN 14.1 09/22/2017 0829   CREATININE 0.68 07/16/2023 0941   CREATININE 0.9 09/22/2017 0829      Component Value Date/Time   CALCIUM 9.3 07/16/2023 0941   CALCIUM 9.1 09/22/2017 0829   ALKPHOS 92 07/09/2023 1044   ALKPHOS 55 09/22/2017 0829   AST 20 07/09/2023 1044   AST 22 09/22/2017 0829   ALT 18 07/09/2023 1044   ALT 30 09/22/2017 0829   BILITOT 0.3 07/09/2023 1044   BILITOT 0.35 09/22/2017 0829

## 2023-07-23 ENCOUNTER — Other Ambulatory Visit (HOSPITAL_BASED_OUTPATIENT_CLINIC_OR_DEPARTMENT_OTHER): Payer: Self-pay

## 2023-07-23 ENCOUNTER — Other Ambulatory Visit: Payer: Self-pay | Admitting: Physician Assistant

## 2023-07-23 ENCOUNTER — Telehealth: Payer: Self-pay

## 2023-07-23 MED ORDER — LORAZEPAM 0.5 MG PO TABS
0.5000 mg | ORAL_TABLET | Freq: Two times a day (BID) | ORAL | 0 refills | Status: DC | PRN
  Filled 2023-07-23: qty 60, 30d supply, fill #0

## 2023-07-23 NOTE — Telephone Encounter (Signed)
He called and left a message requesting Ativan Rx to [facility].

## 2023-07-23 NOTE — Telephone Encounter (Signed)
Called and told Rx sent to his preferred pharmacy. Reviewed upcoming appts.

## 2023-07-23 NOTE — Telephone Encounter (Signed)
Patient requested refill for ativan. I have reviewed the PDMP during this encounter.  Prescription sent to pharmacy.

## 2023-07-29 ENCOUNTER — Other Ambulatory Visit (HOSPITAL_COMMUNITY): Payer: Self-pay

## 2023-07-29 MED FILL — Fosaprepitant Dimeglumine For IV Infusion 150 MG (Base Eq): INTRAVENOUS | Qty: 5 | Status: AC

## 2023-07-30 ENCOUNTER — Inpatient Hospital Stay: Payer: Medicaid Other

## 2023-07-30 ENCOUNTER — Other Ambulatory Visit: Payer: Self-pay

## 2023-07-30 ENCOUNTER — Other Ambulatory Visit (HOSPITAL_COMMUNITY): Payer: Self-pay

## 2023-07-30 ENCOUNTER — Other Ambulatory Visit: Payer: Self-pay | Admitting: Hematology and Oncology

## 2023-07-30 ENCOUNTER — Encounter: Payer: Self-pay | Admitting: Hematology and Oncology

## 2023-07-30 ENCOUNTER — Inpatient Hospital Stay: Payer: Medicaid Other | Admitting: Hematology and Oncology

## 2023-07-30 VITALS — BP 149/94 | HR 97 | Temp 98.4°F | Resp 17 | Wt 188.7 lb

## 2023-07-30 DIAGNOSIS — F411 Generalized anxiety disorder: Secondary | ICD-10-CM | POA: Diagnosis not present

## 2023-07-30 DIAGNOSIS — C760 Malignant neoplasm of head, face and neck: Secondary | ICD-10-CM

## 2023-07-30 DIAGNOSIS — G620 Drug-induced polyneuropathy: Secondary | ICD-10-CM

## 2023-07-30 DIAGNOSIS — C7800 Secondary malignant neoplasm of unspecified lung: Secondary | ICD-10-CM

## 2023-07-30 DIAGNOSIS — Z7189 Other specified counseling: Secondary | ICD-10-CM | POA: Diagnosis not present

## 2023-07-30 DIAGNOSIS — T451X5A Adverse effect of antineoplastic and immunosuppressive drugs, initial encounter: Secondary | ICD-10-CM

## 2023-07-30 DIAGNOSIS — M542 Cervicalgia: Secondary | ICD-10-CM | POA: Diagnosis not present

## 2023-07-30 DIAGNOSIS — Z5112 Encounter for antineoplastic immunotherapy: Secondary | ICD-10-CM | POA: Diagnosis not present

## 2023-07-30 DIAGNOSIS — Z95828 Presence of other vascular implants and grafts: Secondary | ICD-10-CM

## 2023-07-30 LAB — CMP (CANCER CENTER ONLY)
ALT: 12 U/L (ref 0–44)
AST: 21 U/L (ref 15–41)
Albumin: 4.3 g/dL (ref 3.5–5.0)
Alkaline Phosphatase: 66 U/L (ref 38–126)
Anion gap: 7 (ref 5–15)
BUN: 17 mg/dL (ref 6–20)
CO2: 31 mmol/L (ref 22–32)
Calcium: 9.2 mg/dL (ref 8.9–10.3)
Chloride: 101 mmol/L (ref 98–111)
Creatinine: 0.76 mg/dL (ref 0.61–1.24)
GFR, Estimated: >60 mL/min (ref >60)
Glucose, Bld: 134 mg/dL — ABNORMAL HIGH (ref 70–99)
Potassium: 3.8 mmol/L (ref 3.5–5.1)
Sodium: 139 mmol/L (ref 135–145)
Total Bilirubin: 0.2 mg/dL — ABNORMAL LOW (ref 0.3–1.2)
Total Protein: 7.2 g/dL (ref 6.5–8.1)

## 2023-07-30 LAB — CBC WITH DIFFERENTIAL (CANCER CENTER ONLY)
Abs Immature Granulocytes: 0.02 10*3/uL (ref 0.00–0.07)
Basophils Absolute: 0 10*3/uL (ref 0.0–0.1)
Basophils Relative: 1 %
Eosinophils Absolute: 0.4 10*3/uL (ref 0.0–0.5)
Eosinophils Relative: 7 %
HCT: 36.2 % — ABNORMAL LOW (ref 39.0–52.0)
Hemoglobin: 11.5 g/dL — ABNORMAL LOW (ref 13.0–17.0)
Immature Granulocytes: 0 %
Lymphocytes Relative: 31 %
Lymphs Abs: 2 10*3/uL (ref 0.7–4.0)
MCH: 29 pg (ref 26.0–34.0)
MCHC: 31.8 g/dL (ref 30.0–36.0)
MCV: 91.4 fL (ref 80.0–100.0)
Monocytes Absolute: 0.6 10*3/uL (ref 0.1–1.0)
Monocytes Relative: 9 %
Neutro Abs: 3.4 10*3/uL (ref 1.7–7.7)
Neutrophils Relative %: 52 %
Platelet Count: 277 10*3/uL (ref 150–400)
RBC: 3.96 MIL/uL — ABNORMAL LOW (ref 4.22–5.81)
RDW: 16.7 % — ABNORMAL HIGH (ref 11.5–15.5)
WBC Count: 6.4 10*3/uL (ref 4.0–10.5)
nRBC: 0 % (ref 0.0–0.2)

## 2023-07-30 LAB — MAGNESIUM: Magnesium: 1.7 mg/dL (ref 1.7–2.4)

## 2023-07-30 MED ORDER — DIPHENHYDRAMINE HCL 50 MG/ML IJ SOLN
50.0000 mg | Freq: Once | INTRAMUSCULAR | Status: AC
  Administered 2023-07-30: 50 mg via INTRAVENOUS
  Filled 2023-07-30: qty 1

## 2023-07-30 MED ORDER — OXYCODONE HCL 15 MG PO TABS
15.0000 mg | ORAL_TABLET | ORAL | 0 refills | Status: DC | PRN
  Filled 2023-07-30: qty 90, 15d supply, fill #0

## 2023-07-30 MED ORDER — SODIUM CHLORIDE 0.9% FLUSH
10.0000 mL | Freq: Once | INTRAVENOUS | Status: AC
  Administered 2023-07-30: 10 mL

## 2023-07-30 MED ORDER — EMPTY CONTAINERS FLEXIBLE MISC
300.0000 mg/m2 | Freq: Once | Status: AC
  Administered 2023-07-30: 600 mg via INTRAVENOUS
  Filled 2023-07-30: qty 300

## 2023-07-30 MED ORDER — SODIUM CHLORIDE 0.9 % IV SOLN: Freq: Once | INTRAVENOUS | Status: AC

## 2023-07-30 MED ORDER — DEXAMETHASONE SODIUM PHOSPHATE 10 MG/ML IJ SOLN
10.0000 mg | Freq: Once | INTRAMUSCULAR | Status: AC
  Administered 2023-07-30: 10 mg via INTRAVENOUS
  Filled 2023-07-30: qty 1

## 2023-07-30 MED ORDER — FLUOROURACIL CHEMO INJECTION 5 GM/100ML
800.0000 mg/m2/d | INTRAVENOUS | Status: DC
  Administered 2023-07-30: 7000 mg via INTRAVENOUS
  Filled 2023-07-30: qty 140

## 2023-07-30 MED ORDER — PALONOSETRON HCL INJECTION 0.25 MG/5ML
0.2500 mg | Freq: Once | INTRAVENOUS | Status: AC
  Administered 2023-07-30: 0.25 mg via INTRAVENOUS
  Filled 2023-07-30: qty 5

## 2023-07-30 MED ORDER — OXYCODONE HCL 15 MG PO TABS: 15.0000 mg | ORAL_TABLET | ORAL | 0 refills | Status: DC | PRN

## 2023-07-30 MED ORDER — SODIUM CHLORIDE 0.9 % IV SOLN
750.0000 mg | Freq: Once | INTRAVENOUS | Status: AC
  Administered 2023-07-30: 750 mg via INTRAVENOUS
  Filled 2023-07-30: qty 75

## 2023-07-30 MED ORDER — SODIUM CHLORIDE 0.9 % IV SOLN
150.0000 mg | Freq: Once | INTRAVENOUS | Status: AC
  Administered 2023-07-30: 150 mg via INTRAVENOUS
  Filled 2023-07-30: qty 150

## 2023-07-30 MED ORDER — FAMOTIDINE IN NACL 20-0.9 MG/50ML-% IV SOLN
20.0000 mg | Freq: Once | INTRAVENOUS | Status: AC
  Administered 2023-07-30: 20 mg via INTRAVENOUS
  Filled 2023-07-30: qty 50

## 2023-07-30 NOTE — Assessment & Plan Note (Signed)
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

## 2023-07-30 NOTE — Progress Notes (Signed)
[facility] OFFICE PROGRESS NOTE  Patient Care Team: Artis Delay, MD as PCP - General (Hematology and Oncology) Artis Delay, MD as Consulting Physician (Hematology and Oncology) Elfredia Nevins, MD as Referring Physician (Plastic Surgery) Hilarie Fredrickson, MD as Consulting Physician (Gastroenterology)  ASSESSMENT & PLAN:  Malignant neoplasm of head and neck [facility]) His treatment was placed on hold recently due to mucositis and he has progression of his disease in his neck We will resume chemotherapy again today I will increase the dose of his pain medication for symptom relief  Neck pain on right side He has worsening pain control This is due to disease progression I will increase his oxycodone to 15 mg as needed and will reassess pain control next week  Anxiety, generalized He has profound anxiety due to enlarging tumor in his neck He has lorazepam to take as needed  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 It is only mild, not bothering the patient. I will observe for now If it gets worse in the future, I will consider modifying the dose of the treatment   No orders of the defined types were placed in this encounter.   All questions were answered. The patient knows to call the clinic with any problems, questions or concerns. The total time spent in the appointment was 30 minutes encounter with patients including review of chart and various tests results, discussions about plan of care and coordination of care plan   Artis Delay, MD 07/30/2023 11:27 AM  INTERVAL HISTORY: Please see below for problem oriented charting. he returns for follow-up prior to chemotherapy He denies mucositis or skin rash changes different than baseline However, he is concerned about enlarging mass on her right side of his neck It is causing numbness to the other side of his neck as well as he had numbness on  his left foot He is dealing with more anxiety and more pain  REVIEW OF SYSTEMS:   Constitutional: Denies fevers, chills or abnormal weight loss Eyes: Denies blurriness of vision Ears, nose, mouth, throat, and face: Denies mucositis or sore throat Respiratory: Denies cough, dyspnea or wheezes Cardiovascular: Denies palpitation, chest discomfort or lower extremity swelling Gastrointestinal:  Denies nausea, heartburn or change in bowel habits Skin: Denies abnormal skin rashes All other systems were reviewed with the patient and are negative.  I have reviewed the past medical history, past surgical history, social history and family history with the patient and they are unchanged from previous note.  ALLERGIES:  is allergic to phenergan [promethazine hcl], heparin, and clindamycin.  MEDICATIONS:  Current Outpatient Medications  Medication Sig Dispense Refill   bisacodyl (DULCOLAX) 10 MG suppository Place 1 suppository (10 mg total) rectally as needed for moderate constipation. 4 suppository 0   blood glucose meter kit and supplies KIT Dispense based on patient and insurance preference. Use up to four times daily as directed. (FOR ICD-9 250.00, 250.01). 1 each 1   cetirizine (ZYRTEC) 10 MG tablet Take 1 tablet (10 mg total) by mouth daily as needed for allergies. 100 tablet 0   dexamethasone (DECADRON) 4 MG tablet Take 1 tablet (4 mg total) by mouth daily for 3 days after chemo, every 2 weeks. 30 tablet 0   famotidine (PEPCID) 40 MG tablet Take 1 tablet (40 mg total) by mouth at bedtime. 30 tablet 3   levothyroxine (SYNTHROID) 175 MCG tablet Take 1 tablet (175 mcg total) by mouth  daily before breakfast. 30 tablet 3   LORazepam (ATIVAN) 0.5 MG tablet Take 1 tablet (0.5 mg total) by mouth 2 (two) times daily as needed for anxiety. 60 tablet 0   magic mouthwash w/lidocaine SOLN Take 5 mLs by mouth 4 (four) times daily. 5 mls 4 x a day for 10 days, swish and spit. 240 mL 0   metFORMIN (GLUCOPHAGE)  500 MG tablet Take 1 tablet (500 mg total) by mouth 2 (two) times daily with a meal. 60 tablet 2   methadone (DOLOPHINE) 10 MG tablet Take 1 tablet (10 mg) by mouth every 8 hours. 90 tablet 0   minocycline (MINOCIN) 100 MG capsule Take 1 capsule (100 mg total) by mouth 2 (two) times daily. 60 capsule 1   ondansetron (ZOFRAN) 8 MG tablet Take 1 tablet (8 mg total) by mouth every 8 (eight) hours as needed for nausea or vomiting. 90 tablet 1   pantoprazole (PROTONIX) 40 MG tablet Take 1 tablet (40 mg total) by mouth daily. 30 tablet 2   polyethylene glycol (MIRALAX / GLYCOLAX) 17 g packet Take 17 g by mouth 2 (two) times daily.     prochlorperazine (COMPAZINE) 10 MG tablet Take 1 tablet (10 mg total) by mouth every 6 (six) hours as needed for nausea or vomiting. 30 tablet 2   senna-docusate (SENOKOT-S) 8.6-50 MG tablet Take 2 tablets by mouth 2 (two) times daily. 100 tablet 3   No current facility-administered medications for this visit.    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 Pan-endoscopy elsewhere showed cancer from nasopharyngeal space.   01/04/2007 - 02/20/2007 Chemotherapy   He received 2 cycles of cisplatin and 5FU followed by concurrent chemo with weekly cisplatin and radiation. He only received 2 doses of chemo due to severe mucositis, nausea and weight loss.   04/05/2007 - 08/04/2007 Chemotherapy   He received 4 more  courses of cisplatin with 5FU and had complete response   07/05/2009 Procedure   Fine-needle aspirate of the right level II lymph nodes come from recurrent metastatic disease. Repeat endoscopy and CT scan show no evidence of disease elsewhere.   07/08/2009 - 12/02/2009 Chemotherapy   He was given 6 cycles of carboplatin, 5-FU and docetaxel   12/03/2009 Surgery   He has surgery to the residual lymph node on the right neck which showed no evidence of disease.   02/22/2012 Imaging   Repeat imaging study showed large recurrent mass. He was referred elsewhere for further treatment.   05/03/2012 Surgery   He underwent left upper lobectomy.   04/29/2013 Imaging   PEt scan showed lesion on right level II B and lower lung was abnormal   06/03/2013 - 02/02/2014 Chemotherapy   He had 6 cycles of chemotherapy when he was found to have recurrence of cancer and had received oxaliplatin and capecitabine   06/07/2014 Imaging   PET CT scan showed persistent disease in the right  neck lymph nodes and left lung   06/29/2014 Procedure   Accession: [id number] repeat LUL biopsy confirmed metastatic cancer   07/18/2014 - 07/31/2014 Radiation Therapy   He received palliative radiation therapy to the lungs   10/10/2014 Imaging   CT scan of the chest, abdomen and pelvis show regression in the size of the lung nodule in the left upper lobe and stable pulmonary nodules   01/24/2015 Imaging   CT scan showed stable disease in neck and lung   06/19/2015 Imaging   CT scan of the neck and the chest show possible mild progression of the nodule in the right side of the neck.   06/25/2015 Imaging   PET scan confirmed disease recurrence in the neck   07/07/2015 Imaging   [facility]   09/03/2015 - 08/26/2018 Chemotherapy   He received palliative chemo with Nivolumab   10/29/2015 Imaging   PET CT showed positive response to Rx   02/28/2016 Imaging   Ct abdomen showed abnormal thinkening in his stomach    03/03/2016 Imaging   CT: Right sternocleidomastoid muscle metastasis appears less distinct but otherwise not significantly changed in size or configuration since 06/19/2015.2. Left level 3 lymph node which was hypermetabolic by PET-CT in January 2017 appears slightly smaller   04/01/2016 Imaging   CT cervical spine showed no acute fracture or traumatic malalignment in the cervical spine   04/22/2016 Procedure   Port-a-cath placed.   06/16/2016 Imaging   Ct neck showed right sternocleidomastoid muscle metastasis is further decreased in conspicuity since May, and has mildly decreased in size since September 2016. Continued stability of sub-centimeter left cervical lymph nodes. No new or progressive metastatic disease in the neck.   06/16/2016 Imaging   CT chest showed stable masslike radiation fibrosis in the left upper lobe. Stable subcentimeter pulmonary nodules in the bilateral lower lobes. No new or progressive metastatic disease in the chest. Nonobstructing left renal stone.   10/13/2016 Imaging   Ct neck showed unchanged right sternocleidomastoid muscle metastasis. Unchanged subcentimeter left cervical lymph nodes. No evidence of new or progressive metastatic disease in the neck.   10/13/2016 Imaging   CT chest showed tiny hypervascular foci in the liver, not definitely seen on prior imaging of 06/16/2016 and 02/28/2016. Abdomen MRI without and with contrast recommended to further evaluate as metastatic disease is a concern. 2. Stable appearance of post treatment changes left upper lung and scattered tiny bilateral pulmonary nodules.   02/11/2017 Imaging   Ct neck: Lymph node mass right posterior neck appears improved from the prior study. Small posterior lymph nodes on the left unchanged. Occluded right jugular vein unchanged.   02/11/2017 Imaging   1. Similar appearance of postsurgical and radiation changes in the left upper lobe. 2. Similar bilateral pulmonary nodules. 3. No thoracic  adenopathy. 4. Subtle foci of post-contrast enhancement within the liver are suboptimally characterized on this nondedicated study. Likely similar. These could either be re-evaluated at followup or more entirely characterized with abdominal MRI. 5. Left nephrolithiasis.   05/19/2017 Imaging   Matted lymph node mass right posterior neck appears larger in the recent CT. Accurate measurements difficult due to infiltrating tumor margins and infiltration of the muscle. Right jugular vein again appears occluded or resected. Small left posterior lymph nodes stable. Left upper lobe airspace density stable and similar to the prior CT   06/03/2017 PET scan   1. Hypermetabolic ill-defined right level IIb lymph node, about 1.3 cm in diameter with maximum  SUV 9.5 (formerly 8.1). Appearance suspicious for residual/recurrent malignancy. No worrisome left-sided lesion. 2. Left suprahilar indistinct opacity demonstrates no worrisome hypermetabolic activity. The 5 mm left lower lobe pulmonary nodule is stable and not currently hypermetabolic although below sensitive PET-CT size thresholds. 3. Other imaging findings of potential clinical significance: Bilateral nonobstructive nephrolithiasis. Chronic bilateral maxillary sinusitis.   05/11/2018 PET scan   1. Continued chronic accentuated metabolic activity in the vicinity of right level IIB and the adjacent right sternocleidomastoid muscle, with ill definition of surrounding tissue planes. Maximum SUV is currently 8.1, formerly 9.5. Accentuated metabolic activity is been present in this vicinity back through 06/25/2015, and there was also some low-level activity in this vicinity on 06/07/2014. Some of this may be from scarring and local muscular activity although clearly a component of residual tumor is difficult to exclude given the focally high activity. 2. Other imaging findings of potential clinical significance: Chronic bilateral maxillary sinusitis. Chronic scarring in  the left upper lobe. Chronically stable 5 mm left lower lobe nodule is considered benign. Nonobstructive left nephrolithiasis.   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 COMMENT:No significant keratinization is identified. Other basaloid carcinomas are in the differential diagnosis. No cell block material is available for further testing.   09/12/2018 Procedure   He underwent fine Needle Aspiration   10/04/2018 PET scan   1. Significant progression of local recurrence laterally in the mid right neck with an enlarging, increasingly hypermetabolic soft tissue mass. This involves the right sternocleidomastoid muscle. 2. Small lymph nodes in the right axilla are increasingly hypermetabolic. These are nonspecific and potentially reactive, although could reflect a small metastases. Small hypermetabolic nodule in the left suprasternal notch is unchanged. 3. No other evidence of metastatic disease.     10/07/2018 - 12/23/2018 Chemotherapy   The patient had cisplatin plus gemzar   12/07/2018 Imaging   1. Decreased size of lateral right neck mass. 2. Unchanged soft tissue nodule in the suprasternal notch. 3. No evidence of new metastatic disease in the neck.     05/30/2019 Imaging   CT neck No clear change or progression compared to the study of March. Overall measurements of the right lateral neck mass are similar, approximately 3 x 1.8 cm. See above discussion. One could argue that there is slight increase in lateral bulging, possibly with an increase in contrast enhancement, towards the inferior margin. This is of questionable validity but could possibly represent some progression or inflammatory change. Other findings in the region are stable.   07/20/2019 - 09/15/2019 Chemotherapy   The patient had gemzar and cisplatin   10/02/2019 Imaging   CT  neck As compared to 05/30/2019, no significant interval change in size of an ill-defined mass within the right lateral neck, again measuring 3.3 x 1.8 cm in transaxial dimensions.   Unchanged mildly enlarged left level I lymph node measuring 1.1 cm in short axis.   Unchanged node or nodule at the thoracic inlet, measuring 1.3 x 0.8 cm.   Please refer to concurrently performed chest CT for a description of findings below the level of the thoracic inlet.     10/02/2019 Imaging   CT chest 1. No new or progressive findings in the chest to suggest metastatic disease. 2. Bilateral subcentimeter solid pulmonary nodules are stable since 2018. 3. Hyperdense 1.1 cm anterior liver focus, not clearly visualized on  prior studies. Suggest MRI abdomen without and with IV contrast for further characterization.   10/20/2019 - 12/29/2019 Chemotherapy   The patient had gemzar maintenance   06/12/2020 Imaging   1. Enlarging superficial, exophytic component of the chronic right sternocleidomastoid muscle mass. See series 6, image 55. 2. Elsewhere stable CT appearance of the Neck.   06/12/2020 Imaging   Post treatment scarring in the left hemithorax, stable. No evidence recurrent or metastatic disease   06/14/2020 - 10/15/2020 Chemotherapy   He received carboplatin, 5FU and Rande Lawman       10/31/2020 Procedure   Interval improvement in right lateral lymph node mass. Improvement in dermal component as well as invasion of the right sternocleidomastoid muscle.   10 mm submental lymph node slightly enlarged compared to the prior study. Continued follow-up recommended.   11/01/2020 - 05/22/2022 Chemotherapy   Patient is on Treatment Plan : HEAD/NECK Pembrolizumab Q21D     07/03/2022 - 10/30/2022 Chemotherapy   Patient is on Treatment Plan : Head and neck Pembrolizumab (400) q42d     11/30/2022 Imaging   CT chest  1. Stable exam. No new or progressive findings to suggest recurrent or metastatic disease. 2.  Stable tiny bilateral nodules since 06/12/2020, consistent with benign etiology. 3. Punctate nonobstructing left renal stone.   12/01/2022 Imaging   CT neck  Growing right neck mass since 2022 with epidural tumor extension via the right C2-3 foramen. Cervical MRI with contrast would be contributory.   12/11/2022 - 04/02/2023 Chemotherapy   Patient is on Treatment Plan : HEAD/NECK toripalimab-tpzi D1 + cisplatin D1 + gemcitabine D1,8 q21d x 6 cycles / toripalimab-tpzi q21d (up to 57m)     04/15/2023 Imaging   CT Chest W Contrast  Result Date: 04/15/2023 CLINICAL DATA:  Right neck mass, nasopharyngeal carcinoma EXAM: CT CHEST WITH CONTRAST TECHNIQUE: Multidetector CT imaging of the chest was performed during intravenous contrast administration. RADIATION DOSE REDUCTION: This exam was performed according to the departmental dose-optimization program which includes automated exposure control, adjustment of the mA and/or kV according to patient size and/or use of iterative reconstruction technique. CONTRAST:  75mL OMNIPAQUE IOHEXOL 300 MG/ML  SOLN COMPARISON:  11/27/2022 FINDINGS: Cardiovascular: Right Port-A-Cath tip: Right atrium. Mediastinum/Nodes: Small left supraclavicular lymph node 0.5 cm in short axis on image 4 series 3, formerly the same. No pathologic adenopathy in the chest. Lungs/Pleura: Biapical pleuroparenchymal scarring, right greater than left. Stable left upper lobe scarring and peribronchovascular density with wedge resection line in the left upper lobe, no significant contour change of density in this vicinity to suggest malignancy. Stable 5 by 4 mm left lower lobe nodule and stable 3 by 4 mm right lower lobe nodule, unchanged from at least 2021, compatible with benign etiology. No new lesion identified. Upper Abdomen: 2 mm left kidney upper pole nonobstructive renal calculus, image 43 series 7. Musculoskeletal: Unremarkable IMPRESSION: 1. Stable postoperative findings in the left upper  lobe. 2. Stable small bilateral lower lobe pulmonary nodules, unchanged from at least 2021, compatible with benign etiology. 3. 2 mm left kidney upper pole nonobstructive renal calculus. Electronically Signed   By: Gaylyn Rong M.D.   On: 04/15/2023 14:03   CT Soft Tissue Neck W Contrast  Result Date: 04/15/2023 CLINICAL DATA:  Head/neck cancer.  Last chemo 04/02/2023. EXAM: CT NECK WITH CONTRAST TECHNIQUE: Multidetector CT imaging of the neck was performed using the standard protocol following the bolus administration of intravenous contrast. RADIATION DOSE REDUCTION: This exam was performed according to the departmental  dose-optimization program which includes automated exposure control, adjustment of the mA and/or kV according to patient size and/or use of iterative reconstruction technique. CONTRAST:  75mL OMNIPAQUE IOHEXOL 300 MG/ML  SOLN COMPARISON:  CT neck 11/27/2022. FINDINGS: Pharynx and larynx: There is probable mucoid debris in the right nasal cavity/nasopharynx. The nasal cavity and nasopharynx are otherwise unremarkable. The oral cavity and oropharynx are unremarkable. The parapharyngeal spaces are clear. The hypopharynx and larynx are unremarkable. The epiglottis is normal. There is no retropharyngeal collection. The airway is patent. Salivary glands: Atrophy of the parotid and left submandibular glands is unchanged. The right submandibular gland is again not identified and may be surgically absent. Thyroid: Unremarkable. Lymph nodes: The infiltrative mass in the right neck is again seen. The mass invades through the right C2-C3 neural foramen into the epidural space resulting in mild narrowing of the thecal sac without suspected cord compression. The degree of intraspinal epidural tumor appears slightly increased compared to the study from 11/27/2022. Mass again encases the vertebral artery at the C2-C3 level (4-36), unchanged. The remainder of the infiltrative mass in the paraspinal  musculature with extension into the overlying skin surface does not appear significantly changed in size or extent compared to the prior study. A 0.9 cm midline level I lymph node is unchanged going back to 2021. Otherwise, there is no new or progressive lymphadenopathy in the neck. Vascular: As above, there is encasement of the right vertebral artery at the C2-C3 level by the right neck mass. There is mild calcified plaque at the carotid bifurcations. The right IJ is occluded or sacrificed, unchanged. The left IJ is patent. A right chest wall port is partially imaged. Limited intracranial: Unremarkable. Visualized orbits: Unremarkable. Mastoids and visualized paranasal sinuses: There is mild mucosal thickening in the maxillary sinuses. The mastoid air cells and middle ear cavities are clear. Skeleton: There is no acute osseous abnormality or suspicious osseous lesion. Upper chest: Assessed on the separately dictated CT chest. Other: None. IMPRESSION: 1. Infiltrative mass again seen in the right neck centered in the paraspinal musculature with invasion through the right C2-C3 neural foramen. The bulk of the epidural tumor at this level appears slightly increased in size compared to the prior study from 11/27/2022 with probable leftward displacement of the cord but without frank cord compression. The remainder of the mass in the right neck is otherwise not significantly changed. 2. No new or progressive lymphadenopathy in the remainder of the neck. 3. Probable mucoid debris in the right nasal cavity/nasopharynx. No suspicious mass lesion or enhancement in the nasopharynx. Electronically Signed   By: Lesia Hausen M.D.   On: 04/15/2023 14:02      05/07/2023 -  Chemotherapy   Patient is on Treatment Plan : HEAD/NECK Cetuximab q14d + Carboplatin + 5FU IVCI D1-4 q21d x 6 cycles / Cetuximab q14d       PHYSICAL EXAMINATION: ECOG PERFORMANCE STATUS: 1 - Symptomatic but completely ambulatory  Vitals:   07/30/23 1113   BP: (!) 149/94  Pulse: 97  Resp: 17  Temp: 98.4 F (36.9 C)  SpO2: 97%   Filed Weights   07/30/23 1113  Weight: 188 lb 11.4 oz (85.6 kg)    GENERAL:alert, no distress and comfortable SKIN: skin color, texture, turgor are normal, no rashes or significant lesions EYES: normal, Conjunctiva are pink and non-injected, sclera clear OROPHARYNX:no exudate, no erythema and lips, buccal mucosa, and tongue normal  NECK: The tumor on the right side of his neck has grown since  our last visit  LABORATORY DATA:  I have reviewed the data as listed    Component Value Date/Time   NA 140 07/16/2023 0941   NA 139 09/22/2017 0829   K 3.5 07/16/2023 0941   K 3.5 09/22/2017 0829   CL 105 07/16/2023 0941   CO2 29 07/16/2023 0941   CO2 26 09/22/2017 0829   GLUCOSE 127 (H) 07/16/2023 0941   GLUCOSE 133 09/22/2017 0829   BUN 10 07/16/2023 0941   BUN 14.1 09/22/2017 0829   CREATININE 0.68 07/16/2023 0941   CREATININE 0.9 09/22/2017 0829   CALCIUM 9.3 07/16/2023 0941   CALCIUM 9.1 09/22/2017 0829   PROT 6.9 07/09/2023 1044   PROT 6.8 09/22/2017 0829   ALBUMIN 4.0 07/09/2023 1044   ALBUMIN 4.1 09/22/2017 0829   AST 20 07/09/2023 1044   AST 22 09/22/2017 0829   ALT 18 07/09/2023 1044   ALT 30 09/22/2017 0829   ALKPHOS 92 07/09/2023 1044   ALKPHOS 55 09/22/2017 0829   BILITOT 0.3 07/09/2023 1044   BILITOT 0.35 09/22/2017 0829   GFRNONAA >60 07/16/2023 0941   GFRAA >60 07/05/2020 0845   GFRAA >60 02/06/2019 1215    No results found for: "SPEP", "UPEP"  Lab Results  Component Value Date   WBC 6.4 07/30/2023   NEUTROABS 3.4 07/30/2023   HGB 11.5 (L) 07/30/2023   HCT 36.2 (L) 07/30/2023   MCV 91.4 07/30/2023   PLT 277 07/30/2023      Chemistry      Component Value Date/Time   NA 140 07/16/2023 0941   NA 139 09/22/2017 0829   K 3.5 07/16/2023 0941   K 3.5 09/22/2017 0829   CL 105 07/16/2023 0941   CO2 29 07/16/2023 0941   CO2 26 09/22/2017 0829   BUN 10 07/16/2023 0941   BUN  14.1 09/22/2017 0829   CREATININE 0.68 07/16/2023 0941   CREATININE 0.9 09/22/2017 0829      Component Value Date/Time   CALCIUM 9.3 07/16/2023 0941   CALCIUM 9.1 09/22/2017 0829   ALKPHOS 92 07/09/2023 1044   ALKPHOS 55 09/22/2017 0829   AST 20 07/09/2023 1044   AST 22 09/22/2017 0829   ALT 18 07/09/2023 1044   ALT 30 09/22/2017 0829   BILITOT 0.3 07/09/2023 1044   BILITOT 0.35 09/22/2017 0829

## 2023-07-30 NOTE — Assessment & Plan Note (Signed)
He has profound anxiety due to enlarging tumor in his neck He has lorazepam to take as needed

## 2023-07-30 NOTE — Patient Instructions (Signed)
[facility] [place] [facility]  Discharge Instructions: Thank you for choosing [facility] to provide your oncology and hematology care.   If you have a lab appointment with the Cancer Center, please go directly to the Cancer Center and check in at the registration area.   Wear comfortable clothing and clothing appropriate for easy access to any Portacath or PICC line.   We strive to give you quality time with your provider. You may need to reschedule your appointment if you arrive late (15 or more minutes).  Arriving late affects you and other patients whose appointments are after yours.  Also, if you miss three or more appointments without notifying the office, you may be dismissed from the clinic at the provider's discretion.      For prescription refill requests, have your pharmacy contact our office and allow 72 hours for refills to be completed.    Today you received the following chemotherapy and/or immunotherapy agents erbitux      To help prevent nausea and vomiting after your treatment, we encourage you to take your nausea medication as directed.  BELOW ARE SYMPTOMS THAT SHOULD BE REPORTED IMMEDIATELY: *FEVER GREATER THAN 100.4 F (38 C) OR HIGHER *CHILLS OR SWEATING *NAUSEA AND VOMITING THAT IS NOT CONTROLLED WITH YOUR NAUSEA MEDICATION *UNUSUAL SHORTNESS OF BREATH *UNUSUAL BRUISING OR BLEEDING *URINARY PROBLEMS (pain or burning when urinating, or frequent urination) *BOWEL PROBLEMS (unusual diarrhea, constipation, pain near the anus) TENDERNESS IN MOUTH AND THROAT WITH OR WITHOUT PRESENCE OF ULCERS (sore throat, sores in mouth, or a toothache) UNUSUAL RASH, SWELLING OR PAIN  UNUSUAL VAGINAL DISCHARGE OR ITCHING   Items with * indicate a potential emergency and should be followed up as soon as possible or go to the Emergency Department if any problems should occur.  Please show the CHEMOTHERAPY ALERT CARD or IMMUNOTHERAPY ALERT CARD at check-in  to the Emergency Department and triage nurse.  Should you have questions after your visit or need to cancel or reschedule your appointment, please contact [facility]  Dept: [phone]  and follow the prompts.  Office hours are 8:00 a.m. to 4:30 p.m. Monday - Friday. Please note that voicemails left after 4:00 p.m. may not be returned until the following business day.  We are closed weekends and major holidays. You have access to a nurse at all times for urgent questions. Please call the main number to the clinic Dept: [phone] and follow the prompts.   For any non-urgent questions, you may also contact your provider using MyChart. We now offer e-Visits for anyone 65 and older to request care online for non-urgent symptoms. For details visit [email or web address].   Also download the MyChart app! Go to the app store, search "MyChart", open the app, select [place], and log in with your MyChart username and password.

## 2023-07-30 NOTE — Assessment & Plan Note (Signed)
he has mild peripheral neuropathy, likely related to side effects of treatment. It is only mild, not bothering the patient. I will observe for now If it gets worse in the future, I will consider modifying the dose of the treatment  

## 2023-07-30 NOTE — Assessment & Plan Note (Signed)
He has worsening pain control This is due to disease progression I will increase his oxycodone to 15 mg as needed and will reassess pain control next week

## 2023-07-31 ENCOUNTER — Other Ambulatory Visit (HOSPITAL_COMMUNITY): Payer: Self-pay

## 2023-07-31 ENCOUNTER — Other Ambulatory Visit: Payer: Self-pay

## 2023-08-03 ENCOUNTER — Inpatient Hospital Stay: Payer: Medicaid Other

## 2023-08-03 VITALS — BP 117/75 | HR 80 | Resp 18

## 2023-08-03 DIAGNOSIS — Z7189 Other specified counseling: Secondary | ICD-10-CM

## 2023-08-03 DIAGNOSIS — C760 Malignant neoplasm of head, face and neck: Secondary | ICD-10-CM

## 2023-08-03 MED ORDER — SODIUM CHLORIDE 0.9% FLUSH
10.0000 mL | INTRAVENOUS | Status: DC | PRN
  Administered 2023-08-03: 10 mL

## 2023-08-13 ENCOUNTER — Inpatient Hospital Stay: Payer: Medicaid Other | Attending: Hematology and Oncology

## 2023-08-13 ENCOUNTER — Encounter: Payer: Self-pay | Admitting: Hematology and Oncology

## 2023-08-13 ENCOUNTER — Inpatient Hospital Stay: Payer: Medicaid Other | Admitting: Hematology and Oncology

## 2023-08-13 ENCOUNTER — Other Ambulatory Visit (HOSPITAL_COMMUNITY): Payer: Self-pay

## 2023-08-13 ENCOUNTER — Inpatient Hospital Stay: Payer: Medicaid Other

## 2023-08-13 VITALS — BP 127/80 | HR 60 | Temp 98.3°F | Resp 18 | Ht 66.0 in | Wt 184.4 lb

## 2023-08-13 DIAGNOSIS — Z5111 Encounter for antineoplastic chemotherapy: Secondary | ICD-10-CM | POA: Diagnosis not present

## 2023-08-13 DIAGNOSIS — Z7189 Other specified counseling: Secondary | ICD-10-CM

## 2023-08-13 DIAGNOSIS — C119 Malignant neoplasm of nasopharynx, unspecified: Secondary | ICD-10-CM | POA: Insufficient documentation

## 2023-08-13 DIAGNOSIS — Z79899 Other long term (current) drug therapy: Secondary | ICD-10-CM | POA: Diagnosis not present

## 2023-08-13 DIAGNOSIS — M542 Cervicalgia: Secondary | ICD-10-CM

## 2023-08-13 DIAGNOSIS — C760 Malignant neoplasm of head, face and neck: Secondary | ICD-10-CM

## 2023-08-13 DIAGNOSIS — L270 Generalized skin eruption due to drugs and medicaments taken internally: Secondary | ICD-10-CM | POA: Diagnosis not present

## 2023-08-13 DIAGNOSIS — Z5112 Encounter for antineoplastic immunotherapy: Secondary | ICD-10-CM | POA: Diagnosis present

## 2023-08-13 LAB — BASIC METABOLIC PANEL - CANCER CENTER ONLY
Anion gap: 6 (ref 5–15)
BUN: 12 mg/dL (ref 6–20)
CO2: 31 mmol/L (ref 22–32)
Calcium: 8.9 mg/dL (ref 8.9–10.3)
Chloride: 103 mmol/L (ref 98–111)
Creatinine: 0.77 mg/dL (ref 0.61–1.24)
GFR, Estimated: >60 mL/min (ref >60)
Glucose, Bld: 177 mg/dL — ABNORMAL HIGH (ref 70–99)
Potassium: 3.4 mmol/L — ABNORMAL LOW (ref 3.5–5.1)
Sodium: 140 mmol/L (ref 135–145)

## 2023-08-13 LAB — MAGNESIUM: Magnesium: 1.7 mg/dL (ref 1.7–2.4)

## 2023-08-13 MED ORDER — DIPHENHYDRAMINE HCL 50 MG/ML IJ SOLN
50.0000 mg | Freq: Once | INTRAMUSCULAR | Status: AC
  Administered 2023-08-13: 50 mg via INTRAVENOUS
  Filled 2023-08-13: qty 1

## 2023-08-13 MED ORDER — SODIUM CHLORIDE 0.9 % IV SOLN: Freq: Once | INTRAVENOUS | Status: AC

## 2023-08-13 MED ORDER — MINOCYCLINE HCL 100 MG PO CAPS
100.0000 mg | ORAL_CAPSULE | Freq: Two times a day (BID) | ORAL | 1 refills | Status: DC
  Filled 2023-08-13: qty 60, 30d supply, fill #0

## 2023-08-13 MED ORDER — PANTOPRAZOLE SODIUM 40 MG PO TBEC
40.0000 mg | DELAYED_RELEASE_TABLET | Freq: Every day | ORAL | 2 refills | Status: DC
  Filled 2023-08-13: qty 30, 30d supply, fill #0

## 2023-08-13 MED ORDER — FAMOTIDINE IN NACL 20-0.9 MG/50ML-% IV SOLN
20.0000 mg | Freq: Once | INTRAVENOUS | Status: AC
Start: 2023-08-13 — End: 2023-08-13
  Administered 2023-08-13: 20 mg via INTRAVENOUS
  Filled 2023-08-13: qty 50

## 2023-08-13 MED ORDER — METHADONE HCL 10 MG PO TABS
10.0000 mg | ORAL_TABLET | Freq: Three times a day (TID) | ORAL | 0 refills | Status: DC
  Filled 2023-08-13: qty 90, 30d supply, fill #0

## 2023-08-13 MED ORDER — CETUXIMAB CHEMO IV INJECTION 200 MG/100ML
300.0000 mg/m2 | Freq: Once | INTRAVENOUS | Status: AC
  Administered 2023-08-13: 600 mg via INTRAVENOUS
  Filled 2023-08-13: qty 300

## 2023-08-13 MED ORDER — SODIUM CHLORIDE 0.9% FLUSH
10.0000 mL | INTRAVENOUS | Status: DC | PRN
  Administered 2023-08-13: 10 mL

## 2023-08-13 NOTE — Patient Instructions (Signed)
[facility] - A DEPT OF [facility]  Discharge Instructions: Thank you for choosing [facility] to provide your oncology and hematology care.   If you have a lab appointment with the Cancer Center, please go directly to the Cancer Center and check in at the registration area.   Wear comfortable clothing and clothing appropriate for easy access to any Portacath or PICC line.   We strive to give you quality time with your provider. You may need to reschedule your appointment if you arrive late (15 or more minutes).  Arriving late affects you and other patients whose appointments are after yours.  Also, if you miss three or more appointments without notifying the office, you may be dismissed from the clinic at the provider's discretion.      For prescription refill requests, have your pharmacy contact our office and allow 72 hours for refills to be completed.    Today you received the following chemotherapy and/or immunotherapy agents Erbitux.      To help prevent nausea and vomiting after your treatment, we encourage you to take your nausea medication as directed.  BELOW ARE SYMPTOMS THAT SHOULD BE REPORTED IMMEDIATELY: *FEVER GREATER THAN 100.4 F (38 C) OR HIGHER *CHILLS OR SWEATING *NAUSEA AND VOMITING THAT IS NOT CONTROLLED WITH YOUR NAUSEA MEDICATION *UNUSUAL SHORTNESS OF BREATH *UNUSUAL BRUISING OR BLEEDING *URINARY PROBLEMS (pain or burning when urinating, or frequent urination) *BOWEL PROBLEMS (unusual diarrhea, constipation, pain near the anus) TENDERNESS IN MOUTH AND THROAT WITH OR WITHOUT PRESENCE OF ULCERS (sore throat, sores in mouth, or a toothache) UNUSUAL RASH, SWELLING OR PAIN  UNUSUAL VAGINAL DISCHARGE OR ITCHING   Items with * indicate a potential emergency and should be followed up as soon as possible or go to the Emergency Department if any problems should occur.  Please show the CHEMOTHERAPY ALERT CARD or IMMUNOTHERAPY  ALERT CARD at check-in to the Emergency Department and triage nurse.  Should you have questions after your visit or need to cancel or reschedule your appointment, please contact [facility] - A DEPT OF [facility]  Dept: [phone]  and follow the prompts.  Office hours are 8:00 a.m. to 4:30 p.m. Monday - Friday. Please note that voicemails left after 4:00 p.m. may not be returned until the following business day.  We are closed weekends and major holidays. You have access to a nurse at all times for urgent questions. Please call the main number to the clinic Dept: [phone] and follow the prompts.   For any non-urgent questions, you may also contact your provider using MyChart. We now offer e-Visits for anyone 70 and older to request care online for non-urgent symptoms. For details visit [email or web address].   Also download the MyChart app! Go to the app store, search "MyChart", open the app, select [facility], and log in with your MyChart username and password.

## 2023-08-13 NOTE — Assessment & Plan Note (Signed)
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

## 2023-08-13 NOTE — Progress Notes (Signed)
Patient seen by Dr. Eugene Gavia are within treatment parameters.  Labs reviewed: and are within treatment parameters.  Per physician team, patient is ready for treatment and there are NO modifications to the treatment plan.

## 2023-08-13 NOTE — Progress Notes (Signed)
This pt was observed by this RN for 1 hr post infusion. Pt tolerated tx well without incident.

## 2023-08-13 NOTE — Progress Notes (Signed)
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) With recent chemotherapy, the skin lesion on the right side of his neck has resolved The patient continues to be highly sensitive to chemotherapy He will continue his weekly cetuximab today I expected potential flare of his acne and I recommend the patient to start taking minocycline I will see him again next week for further follow-up  Drug-induced skin rash This is improved This is related to cetuximab and he will continue current treatment and I advised him to restart taking minocycline  Neck pain on right side He has stable pain control Refill his prescription of methadone today  No orders of the defined types were placed in this encounter.   All questions were answered. The patient knows to call the clinic with any problems, questions or concerns. The total time spent in the appointment was 30 minutes encounter with patients including review of chart and various tests results, discussions about plan of care and coordination of care plan   Artis Delay, MD 08/13/2023 12:40 PM  INTERVAL HISTORY: Please see below for problem oriented charting. he returns for chemotherapy follow-up Since last time I saw him, the skin lesion has resolved His pain control has improved Denies constipation or nausea We discussed risk of flare of skin rash with treatment today  REVIEW OF SYSTEMS:   Constitutional: Denies fevers, chills or abnormal weight loss Eyes: Denies blurriness of vision Ears, nose, mouth, throat, and face: Denies mucositis or sore throat Respiratory: Denies cough, dyspnea or wheezes Cardiovascular:  Denies palpitation, chest discomfort or lower extremity swelling Gastrointestinal:  Denies nausea, heartburn or change in bowel habits Skin: Denies abnormal skin rashes Lymphatics: Denies new lymphadenopathy or easy bruising Neurological:Denies numbness, tingling or new weaknesses Behavioral/Psych: Mood is stable, no new changes  All other systems were reviewed with the patient and are negative.  I have reviewed the past medical history, past surgical history, social history and family history with the patient and they are unchanged from previous note.  ALLERGIES:  is allergic to phenergan [promethazine hcl], heparin, and clindamycin.  MEDICATIONS:  Current Outpatient Medications  Medication Sig Dispense Refill   bisacodyl (DULCOLAX) 10 MG suppository Place 1 suppository (10 mg total) rectally as needed for moderate constipation. 4 suppository 0   blood glucose meter kit and supplies KIT Dispense based on patient and insurance preference. Use up to four times daily as directed. (FOR ICD-9 250.00, 250.01). 1 each 1   cetirizine (ZYRTEC) 10 MG tablet Take 1 tablet (10 mg total) by mouth daily as needed for allergies. 100 tablet 0   dexamethasone (DECADRON) 4 MG tablet Take 1 tablet (4 mg total) by mouth daily for 3 days after chemo, every 2 weeks. 30 tablet 0   famotidine (PEPCID) 40 MG tablet Take 1 tablet (40 mg total) by mouth at bedtime. 30 tablet 3   levothyroxine (SYNTHROID) 175 MCG tablet Take 1 tablet (175 mcg total) by mouth daily before breakfast. 30 tablet 3   LORazepam (ATIVAN) 0.5 MG tablet Take 1 tablet (0.5 mg total) by mouth 2 (two) times daily as needed for anxiety. 60 tablet 0   magic mouthwash w/lidocaine SOLN Take 5 mLs by mouth 4 (four)  times daily. 5 mls 4 x a day for 10 days, swish and spit. 240 mL 0   metFORMIN (GLUCOPHAGE) 500 MG tablet Take 1 tablet (500 mg total) by mouth 2 (two) times daily with a meal. 60 tablet 2   methadone (DOLOPHINE) 10 MG tablet Take 1 tablet  (10 mg) by mouth every 8 hours. 90 tablet 0   minocycline (MINOCIN) 100 MG capsule Take 1 capsule (100 mg total) by mouth 2 (two) times daily. 60 capsule 1   ondansetron (ZOFRAN) 8 MG tablet Take 1 tablet (8 mg total) by mouth every 8 (eight) hours as needed for nausea or vomiting. 90 tablet 1   oxyCODONE (ROXICODONE) 15 MG immediate release tablet Take 1 tablet (15 mg total) by mouth every 4 (four) hours as needed.= for severe pain (pain score 70- 10) 90 tablet 0   pantoprazole (PROTONIX) 40 MG tablet Take 1 tablet (40 mg total) by mouth daily. 30 tablet 2   polyethylene glycol (MIRALAX / GLYCOLAX) 17 g packet Take 17 g by mouth 2 (two) times daily.     prochlorperazine (COMPAZINE) 10 MG tablet Take 1 tablet (10 mg total) by mouth every 6 (six) hours as needed for nausea or vomiting. 30 tablet 2   senna-docusate (SENOKOT-S) 8.6-50 MG tablet Take 2 tablets by mouth 2 (two) times daily. 100 tablet 3   No current facility-administered medications for this visit.    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 Pan-endoscopy elsewhere showed cancer from nasopharyngeal space.   01/04/2007 - 02/20/2007 Chemotherapy   He received 2 cycles of cisplatin and 5FU followed by concurrent chemo with weekly cisplatin and radiation. He only received 2 doses of chemo due to severe mucositis, nausea and weight loss.   04/05/2007 - 08/04/2007 Chemotherapy    He received 4 more courses of cisplatin with 5FU and had complete response   07/05/2009 Procedure   Fine-needle aspirate of the right level II lymph nodes come from recurrent metastatic disease. Repeat endoscopy and CT scan show no evidence of disease elsewhere.   07/08/2009 - 12/02/2009 Chemotherapy   He was given 6 cycles of carboplatin, 5-FU and docetaxel   12/03/2009 Surgery   He has surgery to the residual lymph node on the right neck which showed no evidence of disease.   02/22/2012 Imaging   Repeat imaging study showed large recurrent mass. He was referred elsewhere for further treatment.   05/03/2012 Surgery   He underwent left upper lobectomy.   04/29/2013 Imaging   PEt scan showed lesion on right level II B and lower lung was abnormal   06/03/2013 - 02/02/2014 Chemotherapy   He had 6 cycles of chemotherapy when he was found to have recurrence of cancer and had received oxaliplatin and capecitabine   06/07/2014 Imaging   PET CT scan showed persistent disease in the right neck lymph nodes and left lung   06/29/2014 Procedure  Accession: [id number] repeat LUL biopsy confirmed metastatic cancer   07/18/2014 - 07/31/2014 Radiation Therapy   He received palliative radiation therapy to the lungs   10/10/2014 Imaging   CT scan of the chest, abdomen and pelvis show regression in the size of the lung nodule in the left upper lobe and stable pulmonary nodules   01/24/2015 Imaging   CT scan showed stable disease in neck and lung   06/19/2015 Imaging   CT scan of the neck and the chest show possible mild progression of the nodule in the right side of the neck.   06/25/2015 Imaging   PET scan confirmed disease recurrence in the neck   07/07/2015 Imaging   He had MRI neck at [facility]   09/03/2015 - 08/26/2018 Chemotherapy   He received palliative chemo with Nivolumab   10/29/2015 Imaging   PET CT showed positive response to Rx   02/28/2016 Imaging   Ct abdomen showed abnormal thinkening in  his stomach   03/03/2016 Imaging   CT: Right sternocleidomastoid muscle metastasis appears less distinct but otherwise not significantly changed in size or configuration since 06/19/2015.2. Left level 3 lymph node which was hypermetabolic by PET-CT in January 2017 appears slightly smaller   04/01/2016 Imaging   CT cervical spine showed no acute fracture or traumatic malalignment in the cervical spine   04/22/2016 Procedure   Port-a-cath placed.   06/16/2016 Imaging   Ct neck showed right sternocleidomastoid muscle metastasis is further decreased in conspicuity since May, and has mildly decreased in size since September 2016. Continued stability of sub-centimeter left cervical lymph nodes. No new or progressive metastatic disease in the neck.   06/16/2016 Imaging   CT chest showed stable masslike radiation fibrosis in the left upper lobe. Stable subcentimeter pulmonary nodules in the bilateral lower lobes. No new or progressive metastatic disease in the chest. Nonobstructing left renal stone.   10/13/2016 Imaging   Ct neck showed unchanged right sternocleidomastoid muscle metastasis. Unchanged subcentimeter left cervical lymph nodes. No evidence of new or progressive metastatic disease in the neck.   10/13/2016 Imaging   CT chest showed tiny hypervascular foci in the liver, not definitely seen on prior imaging of 06/16/2016 and 02/28/2016. Abdomen MRI without and with contrast recommended to further evaluate as metastatic disease is a concern. 2. Stable appearance of post treatment changes left upper lung and scattered tiny bilateral pulmonary nodules.   02/11/2017 Imaging   Ct neck: Lymph node mass right posterior neck appears improved from the prior study. Small posterior lymph nodes on the left unchanged. Occluded right jugular vein unchanged.   02/11/2017 Imaging   1. Similar appearance of postsurgical and radiation changes in the left upper lobe. 2. Similar bilateral pulmonary nodules. 3. No  thoracic adenopathy. 4. Subtle foci of post-contrast enhancement within the liver are suboptimally characterized on this nondedicated study. Likely similar. These could either be re-evaluated at followup or more entirely characterized with abdominal MRI. 5. Left nephrolithiasis.   05/19/2017 Imaging   Matted lymph node mass right posterior neck appears larger in the recent CT. Accurate measurements difficult due to infiltrating tumor margins and infiltration of the muscle. Right jugular vein again appears occluded or resected. Small left posterior lymph nodes stable. Left upper lobe airspace density stable and similar to the prior CT   06/03/2017 PET scan   1. Hypermetabolic ill-defined right level IIb lymph node, about 1.3 cm in diameter with maximum SUV 9.5 (formerly 8.1). Appearance suspicious for residual/recurrent malignancy. No worrisome left-sided  lesion. 2. Left suprahilar indistinct opacity demonstrates no worrisome hypermetabolic activity. The 5 mm left lower lobe pulmonary nodule is stable and not currently hypermetabolic although below sensitive PET-CT size thresholds. 3. Other imaging findings of potential clinical significance: Bilateral nonobstructive nephrolithiasis. Chronic bilateral maxillary sinusitis.   05/11/2018 PET scan   1. Continued chronic accentuated metabolic activity in the vicinity of right level IIB and the adjacent right sternocleidomastoid muscle, with ill definition of surrounding tissue planes. Maximum SUV is currently 8.1, formerly 9.5. Accentuated metabolic activity is been present in this vicinity back through 06/25/2015, and there was also some low-level activity in this vicinity on 06/07/2014. Some of this may be from scarring and local muscular activity although clearly a component of residual tumor is difficult to exclude given the focally high activity. 2. Other imaging findings of potential clinical significance: Chronic bilateral maxillary sinusitis. Chronic  scarring in the left upper lobe. Chronically stable 5 mm left lower lobe nodule is considered benign. Nonobstructive left nephrolithiasis.   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 COMMENT:No significant keratinization is identified. Other basaloid carcinomas are in the differential diagnosis. No cell block material is available for further testing.   09/12/2018 Procedure   He underwent fine Needle Aspiration   10/04/2018 PET scan   1. Significant progression of local recurrence laterally in the mid right neck with an enlarging, increasingly hypermetabolic soft tissue mass. This involves the right sternocleidomastoid muscle. 2. Small lymph nodes in the right axilla are increasingly hypermetabolic. These are nonspecific and potentially reactive, although could reflect a small metastases. Small hypermetabolic nodule in the left suprasternal notch is unchanged. 3. No other evidence of metastatic disease.     10/07/2018 - 12/23/2018 Chemotherapy   The patient had cisplatin plus gemzar   12/07/2018 Imaging   1. Decreased size of lateral right neck mass. 2. Unchanged soft tissue nodule in the suprasternal notch. 3. No evidence of new metastatic disease in the neck.     05/30/2019 Imaging   CT neck No clear change or progression compared to the study of March. Overall measurements of the right lateral neck mass are similar, approximately 3 x 1.8 cm. See above discussion. One could argue that there is slight increase in lateral bulging, possibly with an increase in contrast enhancement, towards the inferior margin. This is of questionable validity but could possibly represent some progression or inflammatory change. Other findings in the region are stable.   07/20/2019 - 09/15/2019 Chemotherapy   The patient had gemzar and cisplatin   10/02/2019  Imaging   CT neck As compared to 05/30/2019, no significant interval change in size of an ill-defined mass within the right lateral neck, again measuring 3.3 x 1.8 cm in transaxial dimensions.   Unchanged mildly enlarged left level I lymph node measuring 1.1 cm in short axis.   Unchanged node or nodule at the thoracic inlet, measuring 1.3 x 0.8 cm.   Please refer to concurrently performed chest CT for a description of findings below the level of the thoracic inlet.     10/02/2019 Imaging   CT chest 1. No new or progressive findings in the chest to suggest metastatic disease. 2. Bilateral subcentimeter solid pulmonary nodules are stable since 2018. 3. Hyperdense 1.1 cm anterior liver focus, not clearly visualized on prior studies. Suggest MRI abdomen without and with IV contrast for further  characterization.   10/20/2019 - 12/29/2019 Chemotherapy   The patient had gemzar maintenance   06/12/2020 Imaging   1. Enlarging superficial, exophytic component of the chronic right sternocleidomastoid muscle mass. See series 6, image 55. 2. Elsewhere stable CT appearance of the Neck.   06/12/2020 Imaging   Post treatment scarring in the left hemithorax, stable. No evidence recurrent or metastatic disease   06/14/2020 - 10/15/2020 Chemotherapy   He received carboplatin, 5FU and Rande Lawman       10/31/2020 Procedure   Interval improvement in right lateral lymph node mass. Improvement in dermal component as well as invasion of the right sternocleidomastoid muscle.   10 mm submental lymph node slightly enlarged compared to the prior study. Continued follow-up recommended.   11/01/2020 - 05/22/2022 Chemotherapy   Patient is on Treatment Plan : HEAD/NECK Pembrolizumab Q21D     07/03/2022 - 10/30/2022 Chemotherapy   Patient is on Treatment Plan : Head and neck Pembrolizumab (400) q42d     11/30/2022 Imaging   CT chest  1. Stable exam. No new or progressive findings to suggest recurrent or metastatic  disease. 2. Stable tiny bilateral nodules since 06/12/2020, consistent with benign etiology. 3. Punctate nonobstructing left renal stone.   12/01/2022 Imaging   CT neck  Growing right neck mass since 2022 with epidural tumor extension via the right C2-3 foramen. Cervical MRI with contrast would be contributory.   12/11/2022 - 04/02/2023 Chemotherapy   Patient is on Treatment Plan : HEAD/NECK toripalimab-tpzi D1 + cisplatin D1 + gemcitabine D1,8 q21d x 6 cycles / toripalimab-tpzi q21d (up to 82m)     04/15/2023 Imaging   CT Chest W Contrast  Result Date: 04/15/2023 CLINICAL DATA:  Right neck mass, nasopharyngeal carcinoma EXAM: CT CHEST WITH CONTRAST TECHNIQUE: Multidetector CT imaging of the chest was performed during intravenous contrast administration. RADIATION DOSE REDUCTION: This exam was performed according to the departmental dose-optimization program which includes automated exposure control, adjustment of the mA and/or kV according to patient size and/or use of iterative reconstruction technique. CONTRAST:  75mL OMNIPAQUE IOHEXOL 300 MG/ML  SOLN COMPARISON:  11/27/2022 FINDINGS: Cardiovascular: Right Port-A-Cath tip: Right atrium. Mediastinum/Nodes: Small left supraclavicular lymph node 0.5 cm in short axis on image 4 series 3, formerly the same. No pathologic adenopathy in the chest. Lungs/Pleura: Biapical pleuroparenchymal scarring, right greater than left. Stable left upper lobe scarring and peribronchovascular density with wedge resection line in the left upper lobe, no significant contour change of density in this vicinity to suggest malignancy. Stable 5 by 4 mm left lower lobe nodule and stable 3 by 4 mm right lower lobe nodule, unchanged from at least 2021, compatible with benign etiology. No new lesion identified. Upper Abdomen: 2 mm left kidney upper pole nonobstructive renal calculus, image 43 series 7. Musculoskeletal: Unremarkable IMPRESSION: 1. Stable postoperative findings in the  left upper lobe. 2. Stable small bilateral lower lobe pulmonary nodules, unchanged from at least 2021, compatible with benign etiology. 3. 2 mm left kidney upper pole nonobstructive renal calculus. Electronically Signed   By: Gaylyn Rong M.D.   On: 04/15/2023 14:03   CT Soft Tissue Neck W Contrast  Result Date: 04/15/2023 CLINICAL DATA:  Head/neck cancer.  Last chemo 04/02/2023. EXAM: CT NECK WITH CONTRAST TECHNIQUE: Multidetector CT imaging of the neck was performed using the standard protocol following the bolus administration of intravenous contrast. RADIATION DOSE REDUCTION: This exam was performed according to the departmental dose-optimization program which includes automated exposure control, adjustment of the mA and/or  kV according to patient size and/or use of iterative reconstruction technique. CONTRAST:  75mL OMNIPAQUE IOHEXOL 300 MG/ML  SOLN COMPARISON:  CT neck 11/27/2022. FINDINGS: Pharynx and larynx: There is probable mucoid debris in the right nasal cavity/nasopharynx. The nasal cavity and nasopharynx are otherwise unremarkable. The oral cavity and oropharynx are unremarkable. The parapharyngeal spaces are clear. The hypopharynx and larynx are unremarkable. The epiglottis is normal. There is no retropharyngeal collection. The airway is patent. Salivary glands: Atrophy of the parotid and left submandibular glands is unchanged. The right submandibular gland is again not identified and may be surgically absent. Thyroid: Unremarkable. Lymph nodes: The infiltrative mass in the right neck is again seen. The mass invades through the right C2-C3 neural foramen into the epidural space resulting in mild narrowing of the thecal sac without suspected cord compression. The degree of intraspinal epidural tumor appears slightly increased compared to the study from 11/27/2022. Mass again encases the vertebral artery at the C2-C3 level (4-36), unchanged. The remainder of the infiltrative mass in the  paraspinal musculature with extension into the overlying skin surface does not appear significantly changed in size or extent compared to the prior study. A 0.9 cm midline level I lymph node is unchanged going back to 2021. Otherwise, there is no new or progressive lymphadenopathy in the neck. Vascular: As above, there is encasement of the right vertebral artery at the C2-C3 level by the right neck mass. There is mild calcified plaque at the carotid bifurcations. The right IJ is occluded or sacrificed, unchanged. The left IJ is patent. A right chest wall port is partially imaged. Limited intracranial: Unremarkable. Visualized orbits: Unremarkable. Mastoids and visualized paranasal sinuses: There is mild mucosal thickening in the maxillary sinuses. The mastoid air cells and middle ear cavities are clear. Skeleton: There is no acute osseous abnormality or suspicious osseous lesion. Upper chest: Assessed on the separately dictated CT chest. Other: None. IMPRESSION: 1. Infiltrative mass again seen in the right neck centered in the paraspinal musculature with invasion through the right C2-C3 neural foramen. The bulk of the epidural tumor at this level appears slightly increased in size compared to the prior study from 11/27/2022 with probable leftward displacement of the cord but without frank cord compression. The remainder of the mass in the right neck is otherwise not significantly changed. 2. No new or progressive lymphadenopathy in the remainder of the neck. 3. Probable mucoid debris in the right nasal cavity/nasopharynx. No suspicious mass lesion or enhancement in the nasopharynx. Electronically Signed   By: Lesia Hausen M.D.   On: 04/15/2023 14:02      05/07/2023 -  Chemotherapy   Patient is on Treatment Plan : HEAD/NECK Cetuximab q14d + Carboplatin + 5FU IVCI D1-4 q21d x 6 cycles / Cetuximab q14d       PHYSICAL EXAMINATION: ECOG PERFORMANCE STATUS: 1 - Symptomatic but completely ambulatory  Vitals:    08/13/23 1148  BP: 127/80  Pulse: 60  Resp: 18  Temp: 98.3 F (36.8 C)  SpO2: 100%   Filed Weights   08/13/23 1148  Weight: 184 lb 6.4 oz (83.6 kg)    GENERAL:alert, no distress and comfortable SKIN: The skin lesion on the right side of his neck has disappeared   LABORATORY DATA:  I have reviewed the data as listed    Component Value Date/Time   NA 140 08/13/2023 1054   NA 139 09/22/2017 0829   K 3.4 (L) 08/13/2023 1054   K 3.5 09/22/2017 0829   CL  103 08/13/2023 1054   CO2 31 08/13/2023 1054   CO2 26 09/22/2017 0829   GLUCOSE 177 (H) 08/13/2023 1054   GLUCOSE 133 09/22/2017 0829   BUN 12 08/13/2023 1054   BUN 14.1 09/22/2017 0829   CREATININE 0.77 08/13/2023 1054   CREATININE 0.9 09/22/2017 0829   CALCIUM 8.9 08/13/2023 1054   CALCIUM 9.1 09/22/2017 0829   PROT 7.2 07/30/2023 1055   PROT 6.8 09/22/2017 0829   ALBUMIN 4.3 07/30/2023 1055   ALBUMIN 4.1 09/22/2017 0829   AST 21 07/30/2023 1055   AST 22 09/22/2017 0829   ALT 12 07/30/2023 1055   ALT 30 09/22/2017 0829   ALKPHOS 66 07/30/2023 1055   ALKPHOS 55 09/22/2017 0829   BILITOT 0.2 (L) 07/30/2023 1055   BILITOT 0.35 09/22/2017 0829   GFRNONAA >60 08/13/2023 1054   GFRAA >60 07/05/2020 0845   GFRAA >60 02/06/2019 1215    No results found for: "SPEP", "UPEP"  Lab Results  Component Value Date   WBC 6.4 07/30/2023   NEUTROABS 3.4 07/30/2023   HGB 11.5 (L) 07/30/2023   HCT 36.2 (L) 07/30/2023   MCV 91.4 07/30/2023   PLT 277 07/30/2023      Chemistry      Component Value Date/Time   NA 140 08/13/2023 1054   NA 139 09/22/2017 0829   K 3.4 (L) 08/13/2023 1054   K 3.5 09/22/2017 0829   CL 103 08/13/2023 1054   CO2 31 08/13/2023 1054   CO2 26 09/22/2017 0829   BUN 12 08/13/2023 1054   BUN 14.1 09/22/2017 0829   CREATININE 0.77 08/13/2023 1054   CREATININE 0.9 09/22/2017 0829      Component Value Date/Time   CALCIUM 8.9 08/13/2023 1054   CALCIUM 9.1 09/22/2017 0829   ALKPHOS 66 07/30/2023  1055   ALKPHOS 55 09/22/2017 0829   AST 21 07/30/2023 1055   AST 22 09/22/2017 0829   ALT 12 07/30/2023 1055   ALT 30 09/22/2017 0829   BILITOT 0.2 (L) 07/30/2023 1055   BILITOT 0.35 09/22/2017 0829

## 2023-08-13 NOTE — Assessment & Plan Note (Signed)
With recent chemotherapy, the skin lesion on the right side of his neck has resolved The patient continues to be highly sensitive to chemotherapy He will continue his weekly cetuximab today I expected potential flare of his acne and I recommend the patient to start taking minocycline I will see him again next week for further follow-up

## 2023-08-13 NOTE — Assessment & Plan Note (Signed)
He has stable pain control Refill his prescription of methadone today

## 2023-08-19 MED FILL — Fosaprepitant Dimeglumine For IV Infusion 150 MG (Base Eq): INTRAVENOUS | Qty: 5 | Status: AC

## 2023-08-20 ENCOUNTER — Other Ambulatory Visit (HOSPITAL_COMMUNITY): Payer: Self-pay

## 2023-08-20 ENCOUNTER — Inpatient Hospital Stay: Payer: Medicaid Other | Admitting: Hematology and Oncology

## 2023-08-20 ENCOUNTER — Inpatient Hospital Stay: Payer: Medicaid Other

## 2023-08-20 ENCOUNTER — Encounter: Payer: Self-pay | Admitting: Hematology and Oncology

## 2023-08-20 VITALS — BP 106/69 | HR 69 | Resp 18

## 2023-08-20 VITALS — BP 132/88 | HR 92 | Resp 18 | Ht 66.0 in | Wt 186.6 lb

## 2023-08-20 DIAGNOSIS — Z7189 Other specified counseling: Secondary | ICD-10-CM

## 2023-08-20 DIAGNOSIS — F411 Generalized anxiety disorder: Secondary | ICD-10-CM

## 2023-08-20 DIAGNOSIS — Z5111 Encounter for antineoplastic chemotherapy: Secondary | ICD-10-CM | POA: Diagnosis not present

## 2023-08-20 DIAGNOSIS — D61818 Other pancytopenia: Secondary | ICD-10-CM | POA: Diagnosis not present

## 2023-08-20 DIAGNOSIS — G893 Neoplasm related pain (acute) (chronic): Secondary | ICD-10-CM

## 2023-08-20 DIAGNOSIS — C760 Malignant neoplasm of head, face and neck: Secondary | ICD-10-CM | POA: Diagnosis not present

## 2023-08-20 LAB — CMP (CANCER CENTER ONLY)
ALT: 15 U/L (ref 0–44)
AST: 21 U/L (ref 15–41)
Albumin: 4 g/dL (ref 3.5–5.0)
Alkaline Phosphatase: 70 U/L (ref 38–126)
Anion gap: 7 (ref 5–15)
BUN: 10 mg/dL (ref 6–20)
CO2: 31 mmol/L (ref 22–32)
Calcium: 9.2 mg/dL (ref 8.9–10.3)
Chloride: 103 mmol/L (ref 98–111)
Creatinine: 0.75 mg/dL (ref 0.61–1.24)
GFR, Estimated: >60 mL/min (ref >60)
Glucose, Bld: 152 mg/dL — ABNORMAL HIGH (ref 70–99)
Potassium: 3.4 mmol/L — ABNORMAL LOW (ref 3.5–5.1)
Sodium: 141 mmol/L (ref 135–145)
Total Bilirubin: 0.3 mg/dL (ref <1.2)
Total Protein: 6.8 g/dL (ref 6.5–8.1)

## 2023-08-20 LAB — CBC WITH DIFFERENTIAL (CANCER CENTER ONLY)
Abs Immature Granulocytes: 0.01 10*3/uL (ref 0.00–0.07)
Basophils Absolute: 0 10*3/uL (ref 0.0–0.1)
Basophils Relative: 1 %
Eosinophils Absolute: 0.1 10*3/uL (ref 0.0–0.5)
Eosinophils Relative: 4 %
HCT: 33 % — ABNORMAL LOW (ref 39.0–52.0)
Hemoglobin: 10.9 g/dL — ABNORMAL LOW (ref 13.0–17.0)
Immature Granulocytes: 0 %
Lymphocytes Relative: 39 %
Lymphs Abs: 1.4 10*3/uL (ref 0.7–4.0)
MCH: 29.9 pg (ref 26.0–34.0)
MCHC: 33 g/dL (ref 30.0–36.0)
MCV: 90.4 fL (ref 80.0–100.0)
Monocytes Absolute: 0.4 10*3/uL (ref 0.1–1.0)
Monocytes Relative: 12 %
Neutro Abs: 1.6 10*3/uL — ABNORMAL LOW (ref 1.7–7.7)
Neutrophils Relative %: 44 %
Platelet Count: 230 10*3/uL (ref 150–400)
RBC: 3.65 MIL/uL — ABNORMAL LOW (ref 4.22–5.81)
RDW: 16.5 % — ABNORMAL HIGH (ref 11.5–15.5)
WBC Count: 3.6 10*3/uL — ABNORMAL LOW (ref 4.0–10.5)
nRBC: 0 % (ref 0.0–0.2)

## 2023-08-20 MED ORDER — SODIUM CHLORIDE 0.9 % IV SOLN: Freq: Once | INTRAVENOUS | Status: AC

## 2023-08-20 MED ORDER — SODIUM CHLORIDE 0.9 % IV SOLN
750.0000 mg | Freq: Once | INTRAVENOUS | Status: AC
  Administered 2023-08-20: 750 mg via INTRAVENOUS
  Filled 2023-08-20: qty 75

## 2023-08-20 MED ORDER — SODIUM CHLORIDE 0.9 % IV SOLN
800.0000 mg/m2/d | INTRAVENOUS | Status: DC
  Administered 2023-08-20: 7000 mg via INTRAVENOUS
  Filled 2023-08-20: qty 140

## 2023-08-20 MED ORDER — PALONOSETRON HCL INJECTION 0.25 MG/5ML
0.2500 mg | Freq: Once | INTRAVENOUS | Status: AC
Start: 2023-08-20 — End: 2023-08-20
  Administered 2023-08-20: 0.25 mg via INTRAVENOUS
  Filled 2023-08-20: qty 5

## 2023-08-20 MED ORDER — ONDANSETRON HCL 8 MG PO TABS
8.0000 mg | ORAL_TABLET | Freq: Three times a day (TID) | ORAL | 1 refills | Status: DC | PRN
  Filled 2023-08-20: qty 90, 30d supply, fill #0

## 2023-08-20 MED ORDER — OXYCODONE HCL 10 MG PO TABS
10.0000 mg | ORAL_TABLET | ORAL | 0 refills | Status: DC | PRN
  Filled 2023-08-20: qty 90, 15d supply, fill #0

## 2023-08-20 MED ORDER — LORAZEPAM 0.5 MG PO TABS
0.5000 mg | ORAL_TABLET | Freq: Two times a day (BID) | ORAL | 0 refills | Status: DC | PRN
  Filled 2023-08-20: qty 60, 30d supply, fill #0

## 2023-08-20 MED ORDER — FAMOTIDINE IN NACL 20-0.9 MG/50ML-% IV SOLN
20.0000 mg | Freq: Once | INTRAVENOUS | Status: AC
  Administered 2023-08-20: 20 mg via INTRAVENOUS
  Filled 2023-08-20: qty 50

## 2023-08-20 MED ORDER — DIPHENHYDRAMINE HCL 50 MG/ML IJ SOLN
50.0000 mg | Freq: Once | INTRAMUSCULAR | Status: AC
  Administered 2023-08-20: 50 mg via INTRAVENOUS
  Filled 2023-08-20: qty 1

## 2023-08-20 MED ORDER — SODIUM CHLORIDE 0.9 % IV SOLN
150.0000 mg | Freq: Once | INTRAVENOUS | Status: AC
  Administered 2023-08-20: 150 mg via INTRAVENOUS
  Filled 2023-08-20: qty 150

## 2023-08-20 MED ORDER — DEXAMETHASONE SODIUM PHOSPHATE 10 MG/ML IJ SOLN
10.0000 mg | Freq: Once | INTRAMUSCULAR | Status: AC
  Administered 2023-08-20: 10 mg via INTRAVENOUS
  Filled 2023-08-20: qty 1

## 2023-08-20 NOTE — Patient Instructions (Signed)
[facility] - A DEPT OF [facility]  Discharge Instructions: Thank you for choosing [facility] to provide your oncology and hematology care.   If you have a lab appointment with the Cancer Center, please go directly to the Cancer Center and check in at the registration area.   Wear comfortable clothing and clothing appropriate for easy access to any Portacath or PICC line.   We strive to give you quality time with your provider. You may need to reschedule your appointment if you arrive late (15 or more minutes).  Arriving late affects you and other patients whose appointments are after yours.  Also, if you miss three or more appointments without notifying the office, you may be dismissed from the clinic at the provider's discretion.      For prescription refill requests, have your pharmacy contact our office and allow 72 hours for refills to be completed.    Today you received the following chemotherapy and/or immunotherapy agents: Carboplatin, 5FU      To help prevent nausea and vomiting after your treatment, we encourage you to take your nausea medication as directed.  BELOW ARE SYMPTOMS THAT SHOULD BE REPORTED IMMEDIATELY: *FEVER GREATER THAN 100.4 F (38 C) OR HIGHER *CHILLS OR SWEATING *NAUSEA AND VOMITING THAT IS NOT CONTROLLED WITH YOUR NAUSEA MEDICATION *UNUSUAL SHORTNESS OF BREATH *UNUSUAL BRUISING OR BLEEDING *URINARY PROBLEMS (pain or burning when urinating, or frequent urination) *BOWEL PROBLEMS (unusual diarrhea, constipation, pain near the anus) TENDERNESS IN MOUTH AND THROAT WITH OR WITHOUT PRESENCE OF ULCERS (sore throat, sores in mouth, or a toothache) UNUSUAL RASH, SWELLING OR PAIN  UNUSUAL VAGINAL DISCHARGE OR ITCHING   Items with * indicate a potential emergency and should be followed up as soon as possible or go to the Emergency Department if any problems should occur.  Please show the CHEMOTHERAPY ALERT CARD or  IMMUNOTHERAPY ALERT CARD at check-in to the Emergency Department and triage nurse.  Should you have questions after your visit or need to cancel or reschedule your appointment, please contact [facility] - A DEPT OF [facility]  Dept: [phone]  and follow the prompts.  Office hours are 8:00 a.m. to 4:30 p.m. Monday - Friday. Please note that voicemails left after 4:00 p.m. may not be returned until the following business day.  We are closed weekends and major holidays. You have access to a nurse at all times for urgent questions. Please call the main number to the clinic Dept: [phone] and follow the prompts.   For any non-urgent questions, you may also contact your provider using MyChart. We now offer e-Visits for anyone 43 and older to request care online for non-urgent symptoms. For details visit [email or web address].   Also download the MyChart app! Go to the app store, search "MyChart", open the app, select [place], and log in with your MyChart username and password.

## 2023-08-20 NOTE — Assessment & Plan Note (Signed)
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

## 2023-08-20 NOTE — Assessment & Plan Note (Signed)
We discussed coping strategies I refilled his prescription of lorazepam

## 2023-08-20 NOTE — Progress Notes (Signed)
[facility] OFFICE PROGRESS NOTE  Patient Care Team: Artis Delay, MD as PCP - General (Hematology and Oncology) Artis Delay, MD as Consulting Physician (Hematology and Oncology) Elfredia Nevins, MD as Referring Physician (Plastic Surgery) Hilarie Fredrickson, MD as Consulting Physician (Gastroenterology)  ASSESSMENT & PLAN:  Malignant neoplasm of head and neck [facility]) He continues to have waxing and waning symptoms of disease relapse He has signs of cancer regrowth in his neck but not to the severity of his disease as before He will continue chemotherapy as scheduled I will add additional premedication to avoid risk of allergic reaction  Pancytopenia, acquired (HCC) He has mild persistent pancytopenia from prior treatment He is not symptomatic Observe closely for now  Cancer associated pain His pain control is stable He is okay for me to reduce the dose of oxycodone  Anxiety, generalized We discussed coping strategies I refilled his prescription of lorazepam  No orders of the defined types were placed in this encounter.   All questions were answered. The patient knows to call the clinic with any problems, questions or concerns. The total time spent in the appointment was 30 minutes encounter with patients including review of chart and various tests results, discussions about plan of care and coordination of care plan   Artis Delay, MD 08/20/2023 1:46 PM  INTERVAL HISTORY: Please see below for problem oriented charting. he returns for treatment and follow-up Since last time I saw him, he noticed regrowth of cancer on the right side of his neck He has experienced significant anxiety intermittently Denies problem with nausea He has mild constipation and takes MiraLAX regularly Denies tinnitus No recent signs of neuropathy His chronic cancer pain control is stable  REVIEW OF SYSTEMS:   Constitutional: Denies fevers, chills or abnormal weight loss Eyes: Denies  blurriness of vision Ears, nose, mouth, throat, and face: Denies mucositis or sore throat Respiratory: Denies cough, dyspnea or wheezes Cardiovascular: Denies palpitation, chest discomfort or lower extremity swelling Gastrointestinal:  Denies nausea, heartburn or change in bowel habits Skin: Denies abnormal skin rashes Lymphatics: Denies new lymphadenopathy or easy bruising Neurological:Denies numbness, tingling or new weaknesses All other systems were reviewed with the patient and are negative.  I have reviewed the past medical history, past surgical history, social history and family history with the patient and they are unchanged from previous note.  ALLERGIES:  is allergic to phenergan [promethazine hcl], heparin, and clindamycin.  MEDICATIONS:  Current Outpatient Medications  Medication Sig Dispense Refill   Oxycodone HCl 10 MG TABS Take 1 tablet (10 mg total) by mouth every 4 (four) hours as needed for severe pain (pain score 7-10). 90 tablet 0   bisacodyl (DULCOLAX) 10 MG suppository Place 1 suppository (10 mg total) rectally as needed for moderate constipation. 4 suppository 0   blood glucose meter kit and supplies KIT Dispense based on patient and insurance preference. Use up to four times daily as directed. (FOR ICD-9 250.00, 250.01). 1 each 1   cetirizine (ZYRTEC) 10 MG tablet Take 1 tablet (10 mg total) by mouth daily as needed for allergies. 100 tablet 0   dexamethasone (DECADRON) 4 MG tablet Take 1 tablet (4 mg total) by mouth daily for 3 days after chemo, every 2 weeks. 30 tablet 0   famotidine (PEPCID) 40 MG tablet Take 1 tablet (40 mg total) by mouth at bedtime. 30 tablet 3   levothyroxine (SYNTHROID) 175 MCG tablet Take 1 tablet (175 mcg total) by mouth daily before breakfast.  30 tablet 3   LORazepam (ATIVAN) 0.5 MG tablet Take 1 tablet (0.5 mg total) by mouth 2 (two) times daily as needed for anxiety. 60 tablet 0   magic mouthwash w/lidocaine SOLN Take 5 mLs by mouth 4  (four) times daily. 5 mls 4 x a day for 10 days, swish and spit. 240 mL 0   metFORMIN (GLUCOPHAGE) 500 MG tablet Take 1 tablet (500 mg total) by mouth 2 (two) times daily with a meal. 60 tablet 2   methadone (DOLOPHINE) 10 MG tablet Take 1 tablet (10 mg) by mouth every 8 hours. 90 tablet 0   minocycline (MINOCIN) 100 MG capsule Take 1 capsule (100 mg total) by mouth 2 (two) times daily. 60 capsule 1   ondansetron (ZOFRAN) 8 MG tablet Take 1 tablet (8 mg total) by mouth every 8 (eight) hours as needed for nausea or vomiting. 90 tablet 1   pantoprazole (PROTONIX) 40 MG tablet Take 1 tablet (40 mg total) by mouth daily. 30 tablet 2   polyethylene glycol (MIRALAX / GLYCOLAX) 17 g packet Take 17 g by mouth 2 (two) times daily.     prochlorperazine (COMPAZINE) 10 MG tablet Take 1 tablet (10 mg total) by mouth every 6 (six) hours as needed for nausea or vomiting. 30 tablet 2   senna-docusate (SENOKOT-S) 8.6-50 MG tablet Take 2 tablets by mouth 2 (two) times daily. 100 tablet 3   No current facility-administered medications for this visit.   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 Pan-endoscopy elsewhere showed cancer from nasopharyngeal space.   01/04/2007 - 02/20/2007 Chemotherapy   He received 2 cycles of cisplatin and 5FU followed by concurrent chemo with weekly cisplatin and radiation. He only received 2 doses of chemo due to severe mucositis, nausea and weight loss.   04/05/2007 - 08/04/2007 Chemotherapy   He received 4 more courses of cisplatin with 5FU and had complete response   07/05/2009 Procedure   Fine-needle aspirate of the right level II lymph nodes come from recurrent metastatic disease. Repeat endoscopy and CT scan show no evidence of disease elsewhere.   07/08/2009 - 12/02/2009 Chemotherapy   He was given 6 cycles of carboplatin, 5-FU and docetaxel   12/03/2009 Surgery   He has surgery to the residual lymph node on the right neck which showed no evidence of disease.   02/22/2012 Imaging   Repeat imaging study showed large recurrent mass. He was referred elsewhere for further treatment.  05/03/2012 Surgery   He underwent left upper lobectomy.   04/29/2013 Imaging   PEt scan showed lesion on right level II B and lower lung was abnormal   06/03/2013 - 02/02/2014 Chemotherapy   He had 6 cycles of chemotherapy when he was found to have recurrence of cancer and had received oxaliplatin and capecitabine   06/07/2014 Imaging   PET CT scan showed persistent disease in the right neck lymph nodes and left lung   06/29/2014 Procedure   Accession: [id number] repeat LUL biopsy confirmed metastatic cancer   07/18/2014 - 07/31/2014 Radiation Therapy   He received palliative radiation therapy to the lungs   10/10/2014 Imaging   CT scan of the chest, abdomen and pelvis show regression in the size of the lung nodule in the left upper lobe and stable pulmonary nodules   01/24/2015 Imaging   CT scan showed stable disease in  neck and lung   06/19/2015 Imaging   CT scan of the neck and the chest show possible mild progression of the nodule in the right side of the neck.   06/25/2015 Imaging   PET scan confirmed disease recurrence in the neck   07/07/2015 Imaging   He had MRI neck at [facility]   09/03/2015 - 08/26/2018 Chemotherapy   He received palliative chemo with Nivolumab   10/29/2015 Imaging   PET CT showed positive response to Rx   02/28/2016 Imaging   Ct abdomen showed abnormal thinkening in his stomach   03/03/2016 Imaging   CT: Right sternocleidomastoid muscle metastasis appears less distinct but otherwise not significantly changed in size or configuration since 06/19/2015.2. Left level 3 lymph node which was hypermetabolic by PET-CT in January 2017 appears slightly smaller   04/01/2016 Imaging   CT cervical spine showed no acute fracture or traumatic malalignment in the cervical spine   04/22/2016 Procedure   Port-a-cath placed.   06/16/2016 Imaging   Ct neck showed right sternocleidomastoid muscle metastasis is further decreased in conspicuity since May, and has mildly decreased in size since September 2016. Continued stability of sub-centimeter left cervical lymph nodes. No new or progressive metastatic disease in the neck.   06/16/2016 Imaging   CT chest showed stable masslike radiation fibrosis in the left upper lobe. Stable subcentimeter pulmonary nodules in the bilateral lower lobes. No new or progressive metastatic disease in the chest. Nonobstructing left renal stone.   10/13/2016 Imaging   Ct neck showed unchanged right sternocleidomastoid muscle metastasis. Unchanged subcentimeter left cervical lymph nodes. No evidence of new or progressive metastatic disease in the neck.   10/13/2016 Imaging   CT chest showed tiny hypervascular foci in the liver, not definitely seen on prior imaging of 06/16/2016 and 02/28/2016. Abdomen MRI without and with contrast recommended to further evaluate as metastatic  disease is a concern. 2. Stable appearance of post treatment changes left upper lung and scattered tiny bilateral pulmonary nodules.   02/11/2017 Imaging   Ct neck: Lymph node mass right posterior neck appears improved from the prior study. Small posterior lymph nodes on the left unchanged. Occluded right jugular vein unchanged.   02/11/2017 Imaging   1. Similar appearance of postsurgical and radiation changes in the left upper lobe. 2. Similar bilateral pulmonary nodules. 3. No thoracic adenopathy. 4. Subtle foci of post-contrast enhancement within the liver are suboptimally characterized on this nondedicated study. Likely similar. These could either be re-evaluated at followup or more entirely characterized with abdominal MRI. 5. Left nephrolithiasis.   05/19/2017 Imaging   Matted  lymph node mass right posterior neck appears larger in the recent CT. Accurate measurements difficult due to infiltrating tumor margins and infiltration of the muscle. Right jugular vein again appears occluded or resected. Small left posterior lymph nodes stable. Left upper lobe airspace density stable and similar to the prior CT   06/03/2017 PET scan   1. Hypermetabolic ill-defined right level IIb lymph node, about 1.3 cm in diameter with maximum SUV 9.5 (formerly 8.1). Appearance suspicious for residual/recurrent malignancy. No worrisome left-sided lesion. 2. Left suprahilar indistinct opacity demonstrates no worrisome hypermetabolic activity. The 5 mm left lower lobe pulmonary nodule is stable and not currently hypermetabolic although below sensitive PET-CT size thresholds. 3. Other imaging findings of potential clinical significance: Bilateral nonobstructive nephrolithiasis. Chronic bilateral maxillary sinusitis.   05/11/2018 PET scan   1. Continued chronic accentuated metabolic activity in the vicinity of right level IIB and the adjacent right sternocleidomastoid muscle, with ill definition of surrounding tissue  planes. Maximum SUV is currently 8.1, formerly 9.5. Accentuated metabolic activity is been present in this vicinity back through 06/25/2015, and there was also some low-level activity in this vicinity on 06/07/2014. Some of this may be from scarring and local muscular activity although clearly a component of residual tumor is difficult to exclude given the focally high activity. 2. Other imaging findings of potential clinical significance: Chronic bilateral maxillary sinusitis. Chronic scarring in the left upper lobe. Chronically stable 5 mm left lower lobe nodule is considered benign. Nonobstructive left nephrolithiasis.   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 COMMENT:No significant keratinization is identified. Other basaloid carcinomas are in the differential diagnosis. No cell block material is available for further testing.   09/12/2018 Procedure   He underwent fine Needle Aspiration   10/04/2018 PET scan   1. Significant progression of local recurrence laterally in the mid right neck with an enlarging, increasingly hypermetabolic soft tissue mass. This involves the right sternocleidomastoid muscle. 2. Small lymph nodes in the right axilla are increasingly hypermetabolic. These are nonspecific and potentially reactive, although could reflect a small metastases. Small hypermetabolic nodule in the left suprasternal notch is unchanged. 3. No other evidence of metastatic disease.     10/07/2018 - 12/23/2018 Chemotherapy   The patient had cisplatin plus gemzar   12/07/2018 Imaging   1. Decreased size of lateral right neck mass. 2. Unchanged soft tissue nodule in the suprasternal notch. 3. No evidence of new metastatic disease in the neck.     05/30/2019 Imaging   CT neck No clear change or progression compared to the study of March. Overall  measurements of the right lateral neck mass are similar, approximately 3 x 1.8 cm. See above discussion. One could argue that there is slight increase in lateral bulging, possibly with an increase in contrast enhancement, towards the inferior margin. This is of questionable validity but could possibly represent some progression or inflammatory change. Other findings in the region are stable.   07/20/2019 - 09/15/2019 Chemotherapy   The patient had gemzar and cisplatin   10/02/2019 Imaging   CT neck As compared to 05/30/2019, no significant interval change in size of an ill-defined mass within the right lateral neck, again measuring 3.3 x 1.8 cm in transaxial dimensions.   Unchanged mildly enlarged left level I lymph node measuring 1.1 cm in short axis.   Unchanged node or nodule at  the thoracic inlet, measuring 1.3 x 0.8 cm.   Please refer to concurrently performed chest CT for a description of findings below the level of the thoracic inlet.     10/02/2019 Imaging   CT chest 1. No new or progressive findings in the chest to suggest metastatic disease. 2. Bilateral subcentimeter solid pulmonary nodules are stable since 2018. 3. Hyperdense 1.1 cm anterior liver focus, not clearly visualized on prior studies. Suggest MRI abdomen without and with IV contrast for further characterization.   10/20/2019 - 12/29/2019 Chemotherapy   The patient had gemzar maintenance   06/12/2020 Imaging   1. Enlarging superficial, exophytic component of the chronic right sternocleidomastoid muscle mass. See series 6, image 55. 2. Elsewhere stable CT appearance of the Neck.   06/12/2020 Imaging   Post treatment scarring in the left hemithorax, stable. No evidence recurrent or metastatic disease   06/14/2020 - 10/15/2020 Chemotherapy   He received carboplatin, 5FU and Rande Lawman       10/31/2020 Procedure   Interval improvement in right lateral lymph node mass. Improvement in dermal component as well as invasion of  the right sternocleidomastoid muscle.   10 mm submental lymph node slightly enlarged compared to the prior study. Continued follow-up recommended.   11/01/2020 - 05/22/2022 Chemotherapy   Patient is on Treatment Plan : HEAD/NECK Pembrolizumab Q21D     07/03/2022 - 10/30/2022 Chemotherapy   Patient is on Treatment Plan : Head and neck Pembrolizumab (400) q42d     11/30/2022 Imaging   CT chest  1. Stable exam. No new or progressive findings to suggest recurrent or metastatic disease. 2. Stable tiny bilateral nodules since 06/12/2020, consistent with benign etiology. 3. Punctate nonobstructing left renal stone.   12/01/2022 Imaging   CT neck  Growing right neck mass since 2022 with epidural tumor extension via the right C2-3 foramen. Cervical MRI with contrast would be contributory.   12/11/2022 - 04/02/2023 Chemotherapy   Patient is on Treatment Plan : HEAD/NECK toripalimab-tpzi D1 + cisplatin D1 + gemcitabine D1,8 q21d x 6 cycles / toripalimab-tpzi q21d (up to 55m)     04/15/2023 Imaging   CT Chest W Contrast  Result Date: 04/15/2023 CLINICAL DATA:  Right neck mass, nasopharyngeal carcinoma EXAM: CT CHEST WITH CONTRAST TECHNIQUE: Multidetector CT imaging of the chest was performed during intravenous contrast administration. RADIATION DOSE REDUCTION: This exam was performed according to the departmental dose-optimization program which includes automated exposure control, adjustment of the mA and/or kV according to patient size and/or use of iterative reconstruction technique. CONTRAST:  75mL OMNIPAQUE IOHEXOL 300 MG/ML  SOLN COMPARISON:  11/27/2022 FINDINGS: Cardiovascular: Right Port-A-Cath tip: Right atrium. Mediastinum/Nodes: Small left supraclavicular lymph node 0.5 cm in short axis on image 4 series 3, formerly the same. No pathologic adenopathy in the chest. Lungs/Pleura: Biapical pleuroparenchymal scarring, right greater than left. Stable left upper lobe scarring and peribronchovascular  density with wedge resection line in the left upper lobe, no significant contour change of density in this vicinity to suggest malignancy. Stable 5 by 4 mm left lower lobe nodule and stable 3 by 4 mm right lower lobe nodule, unchanged from at least 2021, compatible with benign etiology. No new lesion identified. Upper Abdomen: 2 mm left kidney upper pole nonobstructive renal calculus, image 43 series 7. Musculoskeletal: Unremarkable IMPRESSION: 1. Stable postoperative findings in the left upper lobe. 2. Stable small bilateral lower lobe pulmonary nodules, unchanged from at least 2021, compatible with benign etiology. 3. 2 mm left kidney upper pole nonobstructive  renal calculus. Electronically Signed   By: Gaylyn Rong M.D.   On: 04/15/2023 14:03   CT Soft Tissue Neck W Contrast  Result Date: 04/15/2023 CLINICAL DATA:  Head/neck cancer.  Last chemo 04/02/2023. EXAM: CT NECK WITH CONTRAST TECHNIQUE: Multidetector CT imaging of the neck was performed using the standard protocol following the bolus administration of intravenous contrast. RADIATION DOSE REDUCTION: This exam was performed according to the departmental dose-optimization program which includes automated exposure control, adjustment of the mA and/or kV according to patient size and/or use of iterative reconstruction technique. CONTRAST:  75mL OMNIPAQUE IOHEXOL 300 MG/ML  SOLN COMPARISON:  CT neck 11/27/2022. FINDINGS: Pharynx and larynx: There is probable mucoid debris in the right nasal cavity/nasopharynx. The nasal cavity and nasopharynx are otherwise unremarkable. The oral cavity and oropharynx are unremarkable. The parapharyngeal spaces are clear. The hypopharynx and larynx are unremarkable. The epiglottis is normal. There is no retropharyngeal collection. The airway is patent. Salivary glands: Atrophy of the parotid and left submandibular glands is unchanged. The right submandibular gland is again not identified and may be surgically absent.  Thyroid: Unremarkable. Lymph nodes: The infiltrative mass in the right neck is again seen. The mass invades through the right C2-C3 neural foramen into the epidural space resulting in mild narrowing of the thecal sac without suspected cord compression. The degree of intraspinal epidural tumor appears slightly increased compared to the study from 11/27/2022. Mass again encases the vertebral artery at the C2-C3 level (4-36), unchanged. The remainder of the infiltrative mass in the paraspinal musculature with extension into the overlying skin surface does not appear significantly changed in size or extent compared to the prior study. A 0.9 cm midline level I lymph node is unchanged going back to 2021. Otherwise, there is no new or progressive lymphadenopathy in the neck. Vascular: As above, there is encasement of the right vertebral artery at the C2-C3 level by the right neck mass. There is mild calcified plaque at the carotid bifurcations. The right IJ is occluded or sacrificed, unchanged. The left IJ is patent. A right chest wall port is partially imaged. Limited intracranial: Unremarkable. Visualized orbits: Unremarkable. Mastoids and visualized paranasal sinuses: There is mild mucosal thickening in the maxillary sinuses. The mastoid air cells and middle ear cavities are clear. Skeleton: There is no acute osseous abnormality or suspicious osseous lesion. Upper chest: Assessed on the separately dictated CT chest. Other: None. IMPRESSION: 1. Infiltrative mass again seen in the right neck centered in the paraspinal musculature with invasion through the right C2-C3 neural foramen. The bulk of the epidural tumor at this level appears slightly increased in size compared to the prior study from 11/27/2022 with probable leftward displacement of the cord but without frank cord compression. The remainder of the mass in the right neck is otherwise not significantly changed. 2. No new or progressive lymphadenopathy in the  remainder of the neck. 3. Probable mucoid debris in the right nasal cavity/nasopharynx. No suspicious mass lesion or enhancement in the nasopharynx. Electronically Signed   By: Lesia Hausen M.D.   On: 04/15/2023 14:02      05/07/2023 -  Chemotherapy   Patient is on Treatment Plan : HEAD/NECK Cetuximab q14d + Carboplatin + 5FU IVCI D1-4 q21d x 6 cycles / Cetuximab q14d       PHYSICAL EXAMINATION: ECOG PERFORMANCE STATUS: 1 - Symptomatic but completely ambulatory  Vitals:   08/20/23 1105  BP: 132/88  Pulse: 92  Resp: 18  SpO2: 100%   Filed Weights  08/20/23 1105  Weight: 186 lb 9.6 oz (84.6 kg)    GENERAL:alert, no distress and comfortable SKIN: He has evidence of recurrent cancer on the right side of his neck NEURO: alert & oriented x 3 with fluent speech, no focal motor/sensory deficits  LABORATORY DATA:  I have reviewed the data as listed    Component Value Date/Time   NA 141 08/20/2023 1045   NA 139 09/22/2017 0829   K 3.4 (L) 08/20/2023 1045   K 3.5 09/22/2017 0829   CL 103 08/20/2023 1045   CO2 31 08/20/2023 1045   CO2 26 09/22/2017 0829   GLUCOSE 152 (H) 08/20/2023 1045   GLUCOSE 133 09/22/2017 0829   BUN 10 08/20/2023 1045   BUN 14.1 09/22/2017 0829   CREATININE 0.75 08/20/2023 1045   CREATININE 0.9 09/22/2017 0829   CALCIUM 9.2 08/20/2023 1045   CALCIUM 9.1 09/22/2017 0829   PROT 6.8 08/20/2023 1045   PROT 6.8 09/22/2017 0829   ALBUMIN 4.0 08/20/2023 1045   ALBUMIN 4.1 09/22/2017 0829   AST 21 08/20/2023 1045   AST 22 09/22/2017 0829   ALT 15 08/20/2023 1045   ALT 30 09/22/2017 0829   ALKPHOS 70 08/20/2023 1045   ALKPHOS 55 09/22/2017 0829   BILITOT 0.3 08/20/2023 1045   BILITOT 0.35 09/22/2017 0829   GFRNONAA >60 08/20/2023 1045   GFRAA >60 07/05/2020 0845   GFRAA >60 02/06/2019 1215    No results found for: "SPEP", "UPEP"  Lab Results  Component Value Date   WBC 3.6 (L) 08/20/2023   NEUTROABS 1.6 (L) 08/20/2023   HGB 10.9 (L) 08/20/2023    HCT 33.0 (L) 08/20/2023   MCV 90.4 08/20/2023   PLT 230 08/20/2023      Chemistry      Component Value Date/Time   NA 141 08/20/2023 1045   NA 139 09/22/2017 0829   K 3.4 (L) 08/20/2023 1045   K 3.5 09/22/2017 0829   CL 103 08/20/2023 1045   CO2 31 08/20/2023 1045   CO2 26 09/22/2017 0829   BUN 10 08/20/2023 1045   BUN 14.1 09/22/2017 0829   CREATININE 0.75 08/20/2023 1045   CREATININE 0.9 09/22/2017 0829      Component Value Date/Time   CALCIUM 9.2 08/20/2023 1045   CALCIUM 9.1 09/22/2017 0829   ALKPHOS 70 08/20/2023 1045   ALKPHOS 55 09/22/2017 0829   AST 21 08/20/2023 1045   AST 22 09/22/2017 0829   ALT 15 08/20/2023 1045   ALT 30 09/22/2017 0829   BILITOT 0.3 08/20/2023 1045   BILITOT 0.35 09/22/2017 0829

## 2023-08-20 NOTE — Assessment & Plan Note (Signed)
His pain control is stable He is okay for me to reduce the dose of oxycodone

## 2023-08-20 NOTE — Assessment & Plan Note (Signed)
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

## 2023-08-24 ENCOUNTER — Inpatient Hospital Stay: Payer: Medicaid Other

## 2023-08-24 DIAGNOSIS — Z95828 Presence of other vascular implants and grafts: Secondary | ICD-10-CM

## 2023-08-24 DIAGNOSIS — C760 Malignant neoplasm of head, face and neck: Secondary | ICD-10-CM

## 2023-08-24 DIAGNOSIS — C7800 Secondary malignant neoplasm of unspecified lung: Secondary | ICD-10-CM

## 2023-08-24 MED ORDER — SODIUM CHLORIDE 0.9% FLUSH: 10.0000 mL | Freq: Once | INTRAVENOUS | Status: DC

## 2023-08-26 ENCOUNTER — Other Ambulatory Visit (HOSPITAL_COMMUNITY): Payer: Self-pay

## 2023-08-27 ENCOUNTER — Inpatient Hospital Stay: Payer: Medicaid Other

## 2023-08-27 ENCOUNTER — Other Ambulatory Visit: Payer: Self-pay

## 2023-08-27 ENCOUNTER — Encounter: Payer: Self-pay | Admitting: Hematology and Oncology

## 2023-08-27 ENCOUNTER — Inpatient Hospital Stay: Payer: Medicaid Other | Admitting: Hematology and Oncology

## 2023-08-27 ENCOUNTER — Other Ambulatory Visit (HOSPITAL_COMMUNITY): Payer: Self-pay

## 2023-08-27 VITALS — BP 132/81 | HR 93 | Temp 97.6°F | Resp 18 | Ht 66.0 in | Wt 182.0 lb

## 2023-08-27 DIAGNOSIS — C760 Malignant neoplasm of head, face and neck: Secondary | ICD-10-CM

## 2023-08-27 DIAGNOSIS — K1231 Oral mucositis (ulcerative) due to antineoplastic therapy: Secondary | ICD-10-CM

## 2023-08-27 DIAGNOSIS — G620 Drug-induced polyneuropathy: Secondary | ICD-10-CM

## 2023-08-27 DIAGNOSIS — Z5111 Encounter for antineoplastic chemotherapy: Secondary | ICD-10-CM | POA: Diagnosis not present

## 2023-08-27 DIAGNOSIS — Z7189 Other specified counseling: Secondary | ICD-10-CM

## 2023-08-27 DIAGNOSIS — Z95828 Presence of other vascular implants and grafts: Secondary | ICD-10-CM

## 2023-08-27 DIAGNOSIS — C7800 Secondary malignant neoplasm of unspecified lung: Secondary | ICD-10-CM

## 2023-08-27 DIAGNOSIS — T451X5A Adverse effect of antineoplastic and immunosuppressive drugs, initial encounter: Secondary | ICD-10-CM

## 2023-08-27 LAB — BASIC METABOLIC PANEL - CANCER CENTER ONLY
Anion gap: 6 (ref 5–15)
BUN: 17 mg/dL (ref 6–20)
CO2: 30 mmol/L (ref 22–32)
Calcium: 9.2 mg/dL (ref 8.9–10.3)
Chloride: 102 mmol/L (ref 98–111)
Creatinine: 0.74 mg/dL (ref 0.61–1.24)
GFR, Estimated: >60 mL/min (ref >60)
Glucose, Bld: 152 mg/dL — ABNORMAL HIGH (ref 70–99)
Potassium: 3.7 mmol/L (ref 3.5–5.1)
Sodium: 138 mmol/L (ref 135–145)

## 2023-08-27 LAB — MAGNESIUM: Magnesium: 1.6 mg/dL — ABNORMAL LOW (ref 1.7–2.4)

## 2023-08-27 MED ORDER — SODIUM CHLORIDE 0.9% FLUSH
10.0000 mL | INTRAVENOUS | Status: DC | PRN
  Administered 2023-08-27: 10 mL

## 2023-08-27 MED ORDER — GABAPENTIN 300 MG PO CAPS
300.0000 mg | ORAL_CAPSULE | Freq: Two times a day (BID) | ORAL | 1 refills | Status: DC
  Filled 2023-08-27: qty 60, 30d supply, fill #0

## 2023-08-27 MED ORDER — SODIUM CHLORIDE 0.9 % IV SOLN: Freq: Once | INTRAVENOUS | Status: AC

## 2023-08-27 MED ORDER — DIPHENHYDRAMINE HCL 50 MG/ML IJ SOLN
50.0000 mg | Freq: Once | INTRAMUSCULAR | Status: AC
  Administered 2023-08-27: 50 mg via INTRAVENOUS
  Filled 2023-08-27: qty 1

## 2023-08-27 MED ORDER — SODIUM CHLORIDE 0.9% FLUSH
10.0000 mL | Freq: Once | INTRAVENOUS | Status: AC
  Administered 2023-08-27: 10 mL

## 2023-08-27 MED ORDER — MAGIC MOUTHWASH W/LIDOCAINE: 5.0000 mL | Freq: Four times a day (QID) | ORAL | 0 refills | Status: DC

## 2023-08-27 MED ORDER — EMPTY CONTAINERS FLEXIBLE MISC
300.0000 mg/m2 | Freq: Once | Status: AC
  Administered 2023-08-27: 600 mg via INTRAVENOUS
  Filled 2023-08-27: qty 300

## 2023-08-27 MED ORDER — ONDANSETRON HCL 4 MG/2ML IJ SOLN
8.0000 mg | Freq: Once | INTRAMUSCULAR | Status: AC
  Administered 2023-08-27: 8 mg via INTRAVENOUS

## 2023-08-27 MED ORDER — FAMOTIDINE IN NACL 20-0.9 MG/50ML-% IV SOLN
20.0000 mg | Freq: Once | INTRAVENOUS | Status: AC
  Administered 2023-08-27: 20 mg via INTRAVENOUS
  Filled 2023-08-27: qty 50

## 2023-08-27 NOTE — Assessment & Plan Note (Signed)
We will proceed with treatment without delay

## 2023-08-27 NOTE — Progress Notes (Signed)
[facility] OFFICE PROGRESS NOTE  Patient Care Team: Artis Delay, MD as PCP - General (Hematology and Oncology) Artis Delay, MD as Consulting Physician (Hematology and Oncology) Elfredia Nevins, MD as Referring Physician (Plastic Surgery) Hilarie Fredrickson, MD as Consulting Physician (Gastroenterology)  ASSESSMENT & PLAN:  Malignant neoplasm of head and neck [facility]) The tumor on the right side of his neck has regressed in size He has just completed 6 cycles of treatment but I am concerned about suboptimal control of his disease with partial response only We might have to extend the duration of his chemotherapy or switch to something else I will reassess next week  Mucositis due to antineoplastic therapy He has significant signs of mucositis from treatment I recommend Magic mouthwash   Neuropathy due to chemotherapeutic drug He has slight neuropathic sensation on the right side of his neck, likely exacerbated by changes in weather I recommend keeping his neck warm and continue pain medicine as directed  Hypomagnesemia We will proceed with treatment without delay  No orders of the defined types were placed in this encounter.   All questions were answered. The patient knows to call the clinic with any problems, questions or concerns. The total time spent in the appointment was 30 minutes encounter with patients including review of chart and various tests results, discussions about plan of care and coordination of care plan   Artis Delay, MD 08/27/2023 3:28 PM  INTERVAL HISTORY: Please see below for problem oriented charting. he returns for chemotherapy and follow-up Since last time I saw him, he has developed mucositis He also have some nausea but no vomiting He has diffuse neck pain especially on the right side of his neck Denies constipation or diarrhea We discussed test results and plan of care  REVIEW OF SYSTEMS:   Constitutional: Denies fevers, chills or  abnormal weight loss Eyes: Denies blurriness of vision Respiratory: Denies cough, dyspnea or wheezes Cardiovascular: Denies palpitation, chest discomfort or lower extremity swelling Lymphatics: Denies new lymphadenopathy or easy bruising Neurological:Denies numbness, tingling or new weaknesses Behavioral/Psych: Mood is stable, no new changes  All other systems were reviewed with the patient and are negative.  I have reviewed the past medical history, past surgical history, social history and family history with the patient and they are unchanged from previous note.  ALLERGIES:  is allergic to phenergan [promethazine hcl], heparin, and clindamycin.  MEDICATIONS:  Current Outpatient Medications  Medication Sig Dispense Refill   gabapentin (NEURONTIN) 300 MG capsule Take 1 capsule (300 mg total) by mouth 2 (two) times daily. 60 capsule 1   bisacodyl (DULCOLAX) 10 MG suppository Place 1 suppository (10 mg total) rectally as needed for moderate constipation. 4 suppository 0   blood glucose meter kit and supplies KIT Dispense based on patient and insurance preference. Use up to four times daily as directed. (FOR ICD-9 250.00, 250.01). 1 each 1   cetirizine (ZYRTEC) 10 MG tablet Take 1 tablet (10 mg total) by mouth daily as needed for allergies. 100 tablet 0   dexamethasone (DECADRON) 4 MG tablet Take 1 tablet (4 mg total) by mouth daily for 3 days after chemo, every 2 weeks. 30 tablet 0   famotidine (PEPCID) 40 MG tablet Take 1 tablet (40 mg total) by mouth at bedtime. 30 tablet 3   levothyroxine (SYNTHROID) 175 MCG tablet Take 1 tablet (175 mcg total) by mouth daily before breakfast. 30 tablet 3   LORazepam (ATIVAN) 0.5 MG tablet Take 1 tablet (0.5  mg total) by mouth 2 (two) times daily as needed for anxiety. 60 tablet 0   magic mouthwash w/lidocaine SOLN Take 5 mLs by mouth 4 (four) times daily. Suspension contains equal amounts of Maalox Extra Strength, nystatin, diphenhydramine and lidocaine.  Swish and spit. 240 mL 0   metFORMIN (GLUCOPHAGE) 500 MG tablet Take 1 tablet (500 mg total) by mouth 2 (two) times daily with a meal. 60 tablet 2   methadone (DOLOPHINE) 10 MG tablet Take 1 tablet (10 mg) by mouth every 8 hours. 90 tablet 0   minocycline (MINOCIN) 100 MG capsule Take 1 capsule (100 mg total) by mouth 2 (two) times daily. 60 capsule 1   ondansetron (ZOFRAN) 8 MG tablet Take 1 tablet (8 mg total) by mouth every 8 (eight) hours as needed for nausea or vomiting. 90 tablet 1   Oxycodone HCl 10 MG TABS Take 1 tablet (10 mg total) by mouth every 4 (four) hours as needed for severe pain (pain score 7-10). 90 tablet 0   pantoprazole (PROTONIX) 40 MG tablet Take 1 tablet (40 mg total) by mouth daily. 30 tablet 2   polyethylene glycol (MIRALAX / GLYCOLAX) 17 g packet Take 17 g by mouth 2 (two) times daily.     prochlorperazine (COMPAZINE) 10 MG tablet Take 1 tablet (10 mg total) by mouth every 6 (six) hours as needed for nausea or vomiting. 30 tablet 2   senna-docusate (SENOKOT-S) 8.6-50 MG tablet Take 2 tablets by mouth 2 (two) times daily. 100 tablet 3   No current facility-administered medications for this visit.   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 Pan-endoscopy elsewhere showed cancer from nasopharyngeal space.   01/04/2007 - 02/20/2007 Chemotherapy   He received 2 cycles of cisplatin and 5FU followed by concurrent chemo with weekly cisplatin and radiation. He only received 2 doses of chemo due to severe mucositis, nausea and weight loss.   04/05/2007 - 08/04/2007 Chemotherapy   He received 4 more courses of cisplatin with 5FU and had complete response   07/05/2009 Procedure   Fine-needle aspirate of the right level II lymph nodes come from recurrent metastatic disease. Repeat endoscopy and CT scan show no evidence of disease elsewhere.   07/08/2009 - 12/02/2009 Chemotherapy   He was given 6 cycles of carboplatin, 5-FU and docetaxel   12/03/2009 Surgery   He has surgery to the residual lymph node on the right neck which showed no evidence of disease.   02/22/2012 Imaging   Repeat imaging study showed large recurrent mass. He was referred elsewhere for further treatment.   05/03/2012 Surgery   He underwent left upper lobectomy.   04/29/2013 Imaging  PEt scan showed lesion on right level II B and lower lung was abnormal   06/03/2013 - 02/02/2014 Chemotherapy   He had 6 cycles of chemotherapy when he was found to have recurrence of cancer and had received oxaliplatin and capecitabine   06/07/2014 Imaging   PET CT scan showed persistent disease in the right neck lymph nodes and left lung   06/29/2014 Procedure   Accession: [id number] repeat LUL biopsy confirmed metastatic cancer   07/18/2014 - 07/31/2014 Radiation Therapy   He received palliative radiation therapy to the lungs   10/10/2014 Imaging   CT scan of the chest, abdomen and pelvis show regression in the size of the lung nodule in the left upper lobe and stable pulmonary nodules   01/24/2015 Imaging   CT scan showed stable disease in neck and lung   06/19/2015 Imaging   CT scan of the neck and the chest show  possible mild progression of the nodule in the right side of the neck.   06/25/2015 Imaging   PET scan confirmed disease recurrence in the neck   07/07/2015 Imaging   He had MRI neck at [facility]   09/03/2015 - 08/26/2018 Chemotherapy   He received palliative chemo with Nivolumab   10/29/2015 Imaging   PET CT showed positive response to Rx   02/28/2016 Imaging   Ct abdomen showed abnormal thinkening in his stomach   03/03/2016 Imaging   CT: Right sternocleidomastoid muscle metastasis appears less distinct but otherwise not significantly changed in size or configuration since 06/19/2015.2. Left level 3 lymph node which was hypermetabolic by PET-CT in January 2017 appears slightly smaller   04/01/2016 Imaging   CT cervical spine showed no acute fracture or traumatic malalignment in the cervical spine   04/22/2016 Procedure   Port-a-cath placed.   06/16/2016 Imaging   Ct neck showed right sternocleidomastoid muscle metastasis is further decreased in conspicuity since May, and has mildly decreased in size since September 2016. Continued stability of sub-centimeter left cervical lymph nodes. No new or progressive metastatic disease in the neck.   06/16/2016 Imaging   CT chest showed stable masslike radiation fibrosis in the left upper lobe. Stable subcentimeter pulmonary nodules in the bilateral lower lobes. No new or progressive metastatic disease in the chest. Nonobstructing left renal stone.   10/13/2016 Imaging   Ct neck showed unchanged right sternocleidomastoid muscle metastasis. Unchanged subcentimeter left cervical lymph nodes. No evidence of new or progressive metastatic disease in the neck.   10/13/2016 Imaging   CT chest showed tiny hypervascular foci in the liver, not definitely seen on prior imaging of 06/16/2016 and 02/28/2016. Abdomen MRI without and with contrast recommended to further evaluate as metastatic disease is a concern. 2. Stable appearance of post treatment changes left upper  lung and scattered tiny bilateral pulmonary nodules.   02/11/2017 Imaging   Ct neck: Lymph node mass right posterior neck appears improved from the prior study. Small posterior lymph nodes on the left unchanged. Occluded right jugular vein unchanged.   02/11/2017 Imaging   1. Similar appearance of postsurgical and radiation changes in the left upper lobe. 2. Similar bilateral pulmonary nodules. 3. No thoracic adenopathy. 4. Subtle foci of post-contrast enhancement within the liver are suboptimally characterized on this nondedicated study. Likely similar. These could either be re-evaluated at followup or more entirely characterized with abdominal MRI. 5. Left nephrolithiasis.   05/19/2017 Imaging   Matted lymph node mass right posterior neck appears larger in the recent CT. Accurate measurements difficult  due to infiltrating tumor margins and infiltration of the muscle. Right jugular vein again appears occluded or resected. Small left posterior lymph nodes stable. Left upper lobe airspace density stable and similar to the prior CT   06/03/2017 PET scan   1. Hypermetabolic ill-defined right level IIb lymph node, about 1.3 cm in diameter with maximum SUV 9.5 (formerly 8.1). Appearance suspicious for residual/recurrent malignancy. No worrisome left-sided lesion. 2. Left suprahilar indistinct opacity demonstrates no worrisome hypermetabolic activity. The 5 mm left lower lobe pulmonary nodule is stable and not currently hypermetabolic although below sensitive PET-CT size thresholds. 3. Other imaging findings of potential clinical significance: Bilateral nonobstructive nephrolithiasis. Chronic bilateral maxillary sinusitis.   05/11/2018 PET scan   1. Continued chronic accentuated metabolic activity in the vicinity of right level IIB and the adjacent right sternocleidomastoid muscle, with ill definition of surrounding tissue planes. Maximum SUV is currently 8.1, formerly 9.5. Accentuated metabolic activity  is been present in this vicinity back through 06/25/2015, and there was also some low-level activity in this vicinity on 06/07/2014. Some of this may be from scarring and local muscular activity although clearly a component of residual tumor is difficult to exclude given the focally high activity. 2. Other imaging findings of potential clinical significance: Chronic bilateral maxillary sinusitis. Chronic scarring in the left upper lobe. Chronically stable 5 mm left lower lobe nodule is considered benign. Nonobstructive left nephrolithiasis.   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 COMMENT:No significant keratinization is identified. Other basaloid carcinomas are in the differential diagnosis. No cell block material is available for further testing.   09/12/2018 Procedure   He underwent fine Needle Aspiration   10/04/2018 PET scan   1. Significant progression of local recurrence laterally in the mid right neck with an enlarging, increasingly hypermetabolic soft tissue mass. This involves the right sternocleidomastoid muscle. 2. Small lymph nodes in the right axilla are increasingly hypermetabolic. These are nonspecific and potentially reactive, although could reflect a small metastases. Small hypermetabolic nodule in the left suprasternal notch is unchanged. 3. No other evidence of metastatic disease.     10/07/2018 - 12/23/2018 Chemotherapy   The patient had cisplatin plus gemzar   12/07/2018 Imaging   1. Decreased size of lateral right neck mass. 2. Unchanged soft tissue nodule in the suprasternal notch. 3. No evidence of new metastatic disease in the neck.     05/30/2019 Imaging   CT neck No clear change or progression compared to the study of March. Overall measurements of the right lateral neck mass are similar, approximately 3 x 1.8 cm. See  above discussion. One could argue that there is slight increase in lateral bulging, possibly with an increase in contrast enhancement, towards the inferior margin. This is of questionable validity but could possibly represent some progression or inflammatory change. Other findings in the region are stable.   07/20/2019 - 09/15/2019 Chemotherapy   The patient had gemzar and cisplatin   10/02/2019 Imaging   CT neck As compared to 05/30/2019, no significant interval change in size of an ill-defined mass within the right lateral neck, again measuring 3.3 x 1.8 cm in transaxial dimensions.   Unchanged mildly enlarged left level I lymph node measuring 1.1 cm in short axis.   Unchanged node or nodule at the thoracic inlet, measuring 1.3 x 0.8 cm.   Please refer to concurrently performed  chest CT for a description of findings below the level of the thoracic inlet.     10/02/2019 Imaging   CT chest 1. No new or progressive findings in the chest to suggest metastatic disease. 2. Bilateral subcentimeter solid pulmonary nodules are stable since 2018. 3. Hyperdense 1.1 cm anterior liver focus, not clearly visualized on prior studies. Suggest MRI abdomen without and with IV contrast for further characterization.   10/20/2019 - 12/29/2019 Chemotherapy   The patient had gemzar maintenance   06/12/2020 Imaging   1. Enlarging superficial, exophytic component of the chronic right sternocleidomastoid muscle mass. See series 6, image 55. 2. Elsewhere stable CT appearance of the Neck.   06/12/2020 Imaging   Post treatment scarring in the left hemithorax, stable. No evidence recurrent or metastatic disease   06/14/2020 - 10/15/2020 Chemotherapy   He received carboplatin, 5FU and Rande Lawman       10/31/2020 Procedure   Interval improvement in right lateral lymph node mass. Improvement in dermal component as well as invasion of the right sternocleidomastoid muscle.   10 mm submental lymph node slightly enlarged  compared to the prior study. Continued follow-up recommended.   11/01/2020 - 05/22/2022 Chemotherapy   Patient is on Treatment Plan : HEAD/NECK Pembrolizumab Q21D     07/03/2022 - 10/30/2022 Chemotherapy   Patient is on Treatment Plan : Head and neck Pembrolizumab (400) q42d     11/30/2022 Imaging   CT chest  1. Stable exam. No new or progressive findings to suggest recurrent or metastatic disease. 2. Stable tiny bilateral nodules since 06/12/2020, consistent with benign etiology. 3. Punctate nonobstructing left renal stone.   12/01/2022 Imaging   CT neck  Growing right neck mass since 2022 with epidural tumor extension via the right C2-3 foramen. Cervical MRI with contrast would be contributory.   12/11/2022 - 04/02/2023 Chemotherapy   Patient is on Treatment Plan : HEAD/NECK toripalimab-tpzi D1 + cisplatin D1 + gemcitabine D1,8 q21d x 6 cycles / toripalimab-tpzi q21d (up to 21m)     04/15/2023 Imaging   CT Chest W Contrast  Result Date: 04/15/2023 CLINICAL DATA:  Right neck mass, nasopharyngeal carcinoma EXAM: CT CHEST WITH CONTRAST TECHNIQUE: Multidetector CT imaging of the chest was performed during intravenous contrast administration. RADIATION DOSE REDUCTION: This exam was performed according to the departmental dose-optimization program which includes automated exposure control, adjustment of the mA and/or kV according to patient size and/or use of iterative reconstruction technique. CONTRAST:  75mL OMNIPAQUE IOHEXOL 300 MG/ML  SOLN COMPARISON:  11/27/2022 FINDINGS: Cardiovascular: Right Port-A-Cath tip: Right atrium. Mediastinum/Nodes: Small left supraclavicular lymph node 0.5 cm in short axis on image 4 series 3, formerly the same. No pathologic adenopathy in the chest. Lungs/Pleura: Biapical pleuroparenchymal scarring, right greater than left. Stable left upper lobe scarring and peribronchovascular density with wedge resection line in the left upper lobe, no significant contour change of  density in this vicinity to suggest malignancy. Stable 5 by 4 mm left lower lobe nodule and stable 3 by 4 mm right lower lobe nodule, unchanged from at least 2021, compatible with benign etiology. No new lesion identified. Upper Abdomen: 2 mm left kidney upper pole nonobstructive renal calculus, image 43 series 7. Musculoskeletal: Unremarkable IMPRESSION: 1. Stable postoperative findings in the left upper lobe. 2. Stable small bilateral lower lobe pulmonary nodules, unchanged from at least 2021, compatible with benign etiology. 3. 2 mm left kidney upper pole nonobstructive renal calculus. Electronically Signed   By: Gaylyn Rong M.D.   On: 04/15/2023  14:03   CT Soft Tissue Neck W Contrast  Result Date: 04/15/2023 CLINICAL DATA:  Head/neck cancer.  Last chemo 04/02/2023. EXAM: CT NECK WITH CONTRAST TECHNIQUE: Multidetector CT imaging of the neck was performed using the standard protocol following the bolus administration of intravenous contrast. RADIATION DOSE REDUCTION: This exam was performed according to the departmental dose-optimization program which includes automated exposure control, adjustment of the mA and/or kV according to patient size and/or use of iterative reconstruction technique. CONTRAST:  75mL OMNIPAQUE IOHEXOL 300 MG/ML  SOLN COMPARISON:  CT neck 11/27/2022. FINDINGS: Pharynx and larynx: There is probable mucoid debris in the right nasal cavity/nasopharynx. The nasal cavity and nasopharynx are otherwise unremarkable. The oral cavity and oropharynx are unremarkable. The parapharyngeal spaces are clear. The hypopharynx and larynx are unremarkable. The epiglottis is normal. There is no retropharyngeal collection. The airway is patent. Salivary glands: Atrophy of the parotid and left submandibular glands is unchanged. The right submandibular gland is again not identified and may be surgically absent. Thyroid: Unremarkable. Lymph nodes: The infiltrative mass in the right neck is again seen.  The mass invades through the right C2-C3 neural foramen into the epidural space resulting in mild narrowing of the thecal sac without suspected cord compression. The degree of intraspinal epidural tumor appears slightly increased compared to the study from 11/27/2022. Mass again encases the vertebral artery at the C2-C3 level (4-36), unchanged. The remainder of the infiltrative mass in the paraspinal musculature with extension into the overlying skin surface does not appear significantly changed in size or extent compared to the prior study. A 0.9 cm midline level I lymph node is unchanged going back to 2021. Otherwise, there is no new or progressive lymphadenopathy in the neck. Vascular: As above, there is encasement of the right vertebral artery at the C2-C3 level by the right neck mass. There is mild calcified plaque at the carotid bifurcations. The right IJ is occluded or sacrificed, unchanged. The left IJ is patent. A right chest wall port is partially imaged. Limited intracranial: Unremarkable. Visualized orbits: Unremarkable. Mastoids and visualized paranasal sinuses: There is mild mucosal thickening in the maxillary sinuses. The mastoid air cells and middle ear cavities are clear. Skeleton: There is no acute osseous abnormality or suspicious osseous lesion. Upper chest: Assessed on the separately dictated CT chest. Other: None. IMPRESSION: 1. Infiltrative mass again seen in the right neck centered in the paraspinal musculature with invasion through the right C2-C3 neural foramen. The bulk of the epidural tumor at this level appears slightly increased in size compared to the prior study from 11/27/2022 with probable leftward displacement of the cord but without frank cord compression. The remainder of the mass in the right neck is otherwise not significantly changed. 2. No new or progressive lymphadenopathy in the remainder of the neck. 3. Probable mucoid debris in the right nasal cavity/nasopharynx. No  suspicious mass lesion or enhancement in the nasopharynx. Electronically Signed   By: Lesia Hausen M.D.   On: 04/15/2023 14:02      05/07/2023 -  Chemotherapy   Patient is on Treatment Plan : HEAD/NECK Cetuximab q14d + Carboplatin + 5FU IVCI D1-4 q21d x 6 cycles / Cetuximab q14d       PHYSICAL EXAMINATION: ECOG PERFORMANCE STATUS: 1 - Symptomatic but completely ambulatory  Vitals:   08/27/23 1120  BP: 132/81  Pulse: 93  Resp: 18  Temp: 97.6 F (36.4 C)  SpO2: 98%   Filed Weights   08/27/23 1120  Weight: 182 lb (82.6  kg)    GENERAL:alert, no distress and comfortable SKIN: The lesion on the right side of his neck is smaller compared to last week's exam He has signs of mucositis but no thrush or active bleeding from his mouth  LABORATORY DATA:  I have reviewed the data as listed    Component Value Date/Time   NA 138 08/27/2023 1050   NA 139 09/22/2017 0829   K 3.7 08/27/2023 1050   K 3.5 09/22/2017 0829   CL 102 08/27/2023 1050   CO2 30 08/27/2023 1050   CO2 26 09/22/2017 0829   GLUCOSE 152 (H) 08/27/2023 1050   GLUCOSE 133 09/22/2017 0829   BUN 17 08/27/2023 1050   BUN 14.1 09/22/2017 0829   CREATININE 0.74 08/27/2023 1050   CREATININE 0.9 09/22/2017 0829   CALCIUM 9.2 08/27/2023 1050   CALCIUM 9.1 09/22/2017 0829   PROT 6.8 08/20/2023 1045   PROT 6.8 09/22/2017 0829   ALBUMIN 4.0 08/20/2023 1045   ALBUMIN 4.1 09/22/2017 0829   AST 21 08/20/2023 1045   AST 22 09/22/2017 0829   ALT 15 08/20/2023 1045   ALT 30 09/22/2017 0829   ALKPHOS 70 08/20/2023 1045   ALKPHOS 55 09/22/2017 0829   BILITOT 0.3 08/20/2023 1045   BILITOT 0.35 09/22/2017 0829   GFRNONAA >60 08/27/2023 1050   GFRAA >60 07/05/2020 0845   GFRAA >60 02/06/2019 1215    No results found for: "SPEP", "UPEP"  Lab Results  Component Value Date   WBC 3.6 (L) 08/20/2023   NEUTROABS 1.6 (L) 08/20/2023   HGB 10.9 (L) 08/20/2023   HCT 33.0 (L) 08/20/2023   MCV 90.4 08/20/2023   PLT 230 08/20/2023       Chemistry      Component Value Date/Time   NA 138 08/27/2023 1050   NA 139 09/22/2017 0829   K 3.7 08/27/2023 1050   K 3.5 09/22/2017 0829   CL 102 08/27/2023 1050   CO2 30 08/27/2023 1050   CO2 26 09/22/2017 0829   BUN 17 08/27/2023 1050   BUN 14.1 09/22/2017 0829   CREATININE 0.74 08/27/2023 1050   CREATININE 0.9 09/22/2017 0829      Component Value Date/Time   CALCIUM 9.2 08/27/2023 1050   CALCIUM 9.1 09/22/2017 0829   ALKPHOS 70 08/20/2023 1045   ALKPHOS 55 09/22/2017 0829   AST 21 08/20/2023 1045   AST 22 09/22/2017 0829   ALT 15 08/20/2023 1045   ALT 30 09/22/2017 0829   BILITOT 0.3 08/20/2023 1045   BILITOT 0.35 09/22/2017 0829       RADIOGRAPHIC STUDIES: I have personally reviewed the radiological images as listed and agreed with the findings in the report. No results found.

## 2023-08-27 NOTE — Progress Notes (Signed)
Per Bertis Ruddy MD, ok to proceed with tx today with low magnesium.

## 2023-08-27 NOTE — Assessment & Plan Note (Signed)
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

## 2023-08-27 NOTE — Assessment & Plan Note (Signed)
He has significant signs of mucositis from treatment I recommend Magic mouthwash

## 2023-08-27 NOTE — Patient Instructions (Signed)
[facility] - A DEPT OF [facility]  Discharge Instructions: Thank you for choosing [facility] to provide your oncology and hematology care.   If you have a lab appointment with the Cancer Center, please go directly to the Cancer Center and check in at the registration area.   Wear comfortable clothing and clothing appropriate for easy access to any Portacath or PICC line.   We strive to give you quality time with your provider. You may need to reschedule your appointment if you arrive late (15 or more minutes).  Arriving late affects you and other patients whose appointments are after yours.  Also, if you miss three or more appointments without notifying the office, you may be dismissed from the clinic at the provider's discretion.      For prescription refill requests, have your pharmacy contact our office and allow 72 hours for refills to be completed.    Today you received the following chemotherapy and/or immunotherapy agents Erbitux.      To help prevent nausea and vomiting after your treatment, we encourage you to take your nausea medication as directed.  BELOW ARE SYMPTOMS THAT SHOULD BE REPORTED IMMEDIATELY: *FEVER GREATER THAN 100.4 F (38 C) OR HIGHER *CHILLS OR SWEATING *NAUSEA AND VOMITING THAT IS NOT CONTROLLED WITH YOUR NAUSEA MEDICATION *UNUSUAL SHORTNESS OF BREATH *UNUSUAL BRUISING OR BLEEDING *URINARY PROBLEMS (pain or burning when urinating, or frequent urination) *BOWEL PROBLEMS (unusual diarrhea, constipation, pain near the anus) TENDERNESS IN MOUTH AND THROAT WITH OR WITHOUT PRESENCE OF ULCERS (sore throat, sores in mouth, or a toothache) UNUSUAL RASH, SWELLING OR PAIN  UNUSUAL VAGINAL DISCHARGE OR ITCHING   Items with * indicate a potential emergency and should be followed up as soon as possible or go to the Emergency Department if any problems should occur.  Please show the CHEMOTHERAPY ALERT CARD or IMMUNOTHERAPY  ALERT CARD at check-in to the Emergency Department and triage nurse.  Should you have questions after your visit or need to cancel or reschedule your appointment, please contact [facility] - A DEPT OF [facility]  Dept: [phone]  and follow the prompts.  Office hours are 8:00 a.m. to 4:30 p.m. Monday - Friday. Please note that voicemails left after 4:00 p.m. may not be returned until the following business day.  We are closed weekends and major holidays. You have access to a nurse at all times for urgent questions. Please call the main number to the clinic Dept: [phone] and follow the prompts.   For any non-urgent questions, you may also contact your provider using MyChart. We now offer e-Visits for anyone 91 and older to request care online for non-urgent symptoms. For details visit [email or web address].   Also download the MyChart app! Go to the app store, search "MyChart", open the app, select [place], and log in with your MyChart username and password.

## 2023-08-27 NOTE — Assessment & Plan Note (Signed)
He has slight neuropathic sensation on the right side of his neck, likely exacerbated by changes in weather I recommend keeping his neck warm and continue pain medicine as directed

## 2023-09-10 ENCOUNTER — Encounter: Payer: Self-pay | Admitting: Hematology and Oncology

## 2023-09-10 ENCOUNTER — Inpatient Hospital Stay: Payer: Medicaid Other

## 2023-09-10 ENCOUNTER — Other Ambulatory Visit: Payer: Self-pay

## 2023-09-10 ENCOUNTER — Inpatient Hospital Stay: Payer: Medicaid Other | Attending: Hematology and Oncology

## 2023-09-10 ENCOUNTER — Other Ambulatory Visit (HOSPITAL_COMMUNITY): Payer: Self-pay

## 2023-09-10 ENCOUNTER — Inpatient Hospital Stay: Payer: Medicaid Other | Admitting: Hematology and Oncology

## 2023-09-10 VITALS — BP 125/79 | HR 86 | Resp 18 | Ht 66.0 in | Wt 185.8 lb

## 2023-09-10 DIAGNOSIS — M62838 Other muscle spasm: Secondary | ICD-10-CM | POA: Diagnosis not present

## 2023-09-10 DIAGNOSIS — Z7189 Other specified counseling: Secondary | ICD-10-CM

## 2023-09-10 DIAGNOSIS — F411 Generalized anxiety disorder: Secondary | ICD-10-CM | POA: Insufficient documentation

## 2023-09-10 DIAGNOSIS — C119 Malignant neoplasm of nasopharynx, unspecified: Secondary | ICD-10-CM | POA: Diagnosis present

## 2023-09-10 DIAGNOSIS — Z79899 Other long term (current) drug therapy: Secondary | ICD-10-CM | POA: Insufficient documentation

## 2023-09-10 DIAGNOSIS — Z5112 Encounter for antineoplastic immunotherapy: Secondary | ICD-10-CM | POA: Diagnosis present

## 2023-09-10 DIAGNOSIS — C760 Malignant neoplasm of head, face and neck: Secondary | ICD-10-CM | POA: Diagnosis not present

## 2023-09-10 DIAGNOSIS — Z902 Acquired absence of lung [part of]: Secondary | ICD-10-CM | POA: Insufficient documentation

## 2023-09-10 DIAGNOSIS — C7800 Secondary malignant neoplasm of unspecified lung: Secondary | ICD-10-CM

## 2023-09-10 DIAGNOSIS — M542 Cervicalgia: Secondary | ICD-10-CM | POA: Insufficient documentation

## 2023-09-10 DIAGNOSIS — Z5111 Encounter for antineoplastic chemotherapy: Secondary | ICD-10-CM | POA: Insufficient documentation

## 2023-09-10 DIAGNOSIS — Z85118 Personal history of other malignant neoplasm of bronchus and lung: Secondary | ICD-10-CM | POA: Diagnosis not present

## 2023-09-10 DIAGNOSIS — Z95828 Presence of other vascular implants and grafts: Secondary | ICD-10-CM

## 2023-09-10 LAB — BASIC METABOLIC PANEL - CANCER CENTER ONLY
Anion gap: 7 (ref 5–15)
BUN: 10 mg/dL (ref 6–20)
CO2: 31 mmol/L (ref 22–32)
Calcium: 8.8 mg/dL — ABNORMAL LOW (ref 8.9–10.3)
Chloride: 103 mmol/L (ref 98–111)
Creatinine: 0.83 mg/dL (ref 0.61–1.24)
GFR, Estimated: >60 mL/min (ref >60)
Glucose, Bld: 131 mg/dL — ABNORMAL HIGH (ref 70–99)
Potassium: 3.1 mmol/L — ABNORMAL LOW (ref 3.5–5.1)
Sodium: 141 mmol/L (ref 135–145)

## 2023-09-10 LAB — MAGNESIUM: Magnesium: 1.8 mg/dL (ref 1.7–2.4)

## 2023-09-10 MED ORDER — FAMOTIDINE IN NACL 20-0.9 MG/50ML-% IV SOLN
20.0000 mg | Freq: Once | INTRAVENOUS | Status: AC
Start: 2023-09-10 — End: 2023-09-10
  Administered 2023-09-10: 20 mg via INTRAVENOUS

## 2023-09-10 MED ORDER — OXYCODONE HCL 10 MG PO TABS
10.0000 mg | ORAL_TABLET | ORAL | 0 refills | Status: DC | PRN
  Filled 2023-09-10: qty 90, 15d supply, fill #0

## 2023-09-10 MED ORDER — METHADONE HCL 10 MG PO TABS
10.0000 mg | ORAL_TABLET | Freq: Three times a day (TID) | ORAL | 0 refills | Status: DC
  Filled 2023-09-10: qty 90, 30d supply, fill #0

## 2023-09-10 MED ORDER — CETUXIMAB CHEMO IV INJECTION 200 MG/100ML
300.0000 mg/m2 | Freq: Once | INTRAVENOUS | Status: DC
  Filled 2023-09-10: qty 300

## 2023-09-10 MED ORDER — POLYETHYLENE GLYCOL 3350 17 G PO PACK
17.0000 g | PACK | Freq: Two times a day (BID) | ORAL | 3 refills | Status: DC
  Filled 2023-09-10 – 2023-12-27 (×2): qty 30, 15d supply, fill #0

## 2023-09-10 MED ORDER — SODIUM CHLORIDE 0.9 % IV SOLN: Freq: Once | INTRAVENOUS | Status: DC

## 2023-09-10 MED ORDER — DIPHENHYDRAMINE HCL 50 MG/ML IJ SOLN
50.0000 mg | Freq: Once | INTRAMUSCULAR | Status: AC
  Administered 2023-09-10: 50 mg via INTRAVENOUS

## 2023-09-10 MED ORDER — CETUXIMAB CHEMO IV INJECTION 200 MG/100ML
300.0000 mg/m2 | Freq: Once | INTRAVENOUS | Status: AC
  Administered 2023-09-10: 600 mg via INTRAVENOUS
  Filled 2023-09-10: qty 300

## 2023-09-10 MED ORDER — SODIUM CHLORIDE 0.9% FLUSH
10.0000 mL | Freq: Once | INTRAVENOUS | Status: AC
  Administered 2023-09-10: 10 mL

## 2023-09-10 MED ORDER — SODIUM CHLORIDE 0.9 % IV SOLN
INTRAVENOUS | Status: DC
Start: 2023-09-10 — End: 2023-09-10

## 2023-09-10 NOTE — Progress Notes (Signed)
[facility] OFFICE PROGRESS NOTE  Patient Care Team: Artis Delay, MD as PCP - General (Hematology and Oncology) Artis Delay, MD as Consulting Physician (Hematology and Oncology) Elfredia Nevins, MD as Referring Physician (Plastic Surgery) Hilarie Fredrickson, MD as Consulting Physician (Gastroenterology)  ASSESSMENT & PLAN:  Malignant neoplasm of head and neck [facility]) He still have residual disease visible on his neck but stable compared to previous exam The patient has completed chemotherapy portion of his treatment and is currently on maintenance cetuximab We discussed the risk and benefits of continuing maintenance treatment versus resumption of chemotherapy After much discussion, we are in agreement to wait and reassess in 2 weeks  Neck pain on right side He has stable pain control Refill his prescription of methadone and oxycodone today  Hypomagnesemia His magnesium level is improved He has borderline hypokalemia but not symptomatic Observe only  No orders of the defined types were placed in this encounter.   All questions were answered. The patient knows to call the clinic with any problems, questions or concerns. The total time spent in the appointment was 30 minutes encounter with patients including review of chart and various tests results, discussions about plan of care and coordination of care plan   Artis Delay, MD 09/10/2023 2:09 PM  INTERVAL HISTORY: Please see below for problem oriented charting. he returns for treatment follow-up He has completed chemotherapy portion of his disease Denies peripheral neuropathy He felt dehydration and requests IV fluids We discussed risk and benefits of chemotherapy versus maintenance and I refilled his prescriptions  REVIEW OF SYSTEMS:   Constitutional: Denies fevers, chills or abnormal weight loss Eyes: Denies blurriness of vision Ears, nose, mouth, throat, and face: Denies mucositis or sore throat Respiratory:  Denies cough, dyspnea or wheezes Cardiovascular: Denies palpitation, chest discomfort or lower extremity swelling Gastrointestinal:  Denies nausea, heartburn or change in bowel habits Lymphatics: Denies new lymphadenopathy or easy bruising Neurological:Denies numbness, tingling or new weaknesses Behavioral/Psych: Mood is stable, no new changes  All other systems were reviewed with the patient and are negative.  I have reviewed the past medical history, past surgical history, social history and family history with the patient and they are unchanged from previous note.  ALLERGIES:  is allergic to phenergan [promethazine hcl], heparin, and clindamycin.  MEDICATIONS:  Current Outpatient Medications  Medication Sig Dispense Refill   bisacodyl (DULCOLAX) 10 MG suppository Place 1 suppository (10 mg total) rectally as needed for moderate constipation. 4 suppository 0   blood glucose meter kit and supplies KIT Dispense based on patient and insurance preference. Use up to four times daily as directed. (FOR ICD-9 250.00, 250.01). 1 each 1   cetirizine (ZYRTEC) 10 MG tablet Take 1 tablet (10 mg total) by mouth daily as needed for allergies. 100 tablet 0   dexamethasone (DECADRON) 4 MG tablet Take 1 tablet (4 mg total) by mouth daily for 3 days after chemo, every 2 weeks. 30 tablet 0   famotidine (PEPCID) 40 MG tablet Take 1 tablet (40 mg total) by mouth at bedtime. 30 tablet 3   gabapentin (NEURONTIN) 300 MG capsule Take 1 capsule (300 mg total) by mouth 2 (two) times daily. 60 capsule 1   levothyroxine (SYNTHROID) 175 MCG tablet Take 1 tablet (175 mcg total) by mouth daily before breakfast. 30 tablet 3   LORazepam (ATIVAN) 0.5 MG tablet Take 1 tablet (0.5 mg total) by mouth 2 (two) times daily as needed for anxiety. 60 tablet 0  magic mouthwash w/lidocaine SOLN Take 5 mLs by mouth 4 (four) times daily. Suspension contains equal amounts of Maalox Extra Strength, nystatin, diphenhydramine and lidocaine.  Swish and spit. 240 mL 0   metFORMIN (GLUCOPHAGE) 500 MG tablet Take 1 tablet (500 mg total) by mouth 2 (two) times daily with a meal. 60 tablet 2   methadone (DOLOPHINE) 10 MG tablet Take 1 tablet (10 mg) by mouth every 8 hours. 90 tablet 0   minocycline (MINOCIN) 100 MG capsule Take 1 capsule (100 mg total) by mouth 2 (two) times daily. 60 capsule 1   ondansetron (ZOFRAN) 8 MG tablet Take 1 tablet (8 mg total) by mouth every 8 (eight) hours as needed for nausea or vomiting. 90 tablet 1   Oxycodone HCl 10 MG TABS Take 1 tablet (10 mg total) by mouth every 4 (four) hours as needed. 90 tablet 0   pantoprazole (PROTONIX) 40 MG tablet Take 1 tablet (40 mg total) by mouth daily. 30 tablet 2   polyethylene glycol (MIRALAX / GLYCOLAX) 17 g packet Take 17 g by mouth 2 (two) times daily. 30 each 3   prochlorperazine (COMPAZINE) 10 MG tablet Take 1 tablet (10 mg total) by mouth every 6 (six) hours as needed for nausea or vomiting. 30 tablet 2   senna-docusate (SENOKOT-S) 8.6-50 MG tablet Take 2 tablets by mouth 2 (two) times daily. 100 tablet 3   No current facility-administered medications for this visit.   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 Pan-endoscopy elsewhere showed cancer from nasopharyngeal space.   01/04/2007 - 02/20/2007 Chemotherapy   He received 2 cycles of cisplatin and 5FU followed by concurrent chemo with weekly cisplatin and radiation. He only received 2 doses of chemo due to severe mucositis, nausea and weight loss.   04/05/2007 - 08/04/2007 Chemotherapy   He received 4 more courses of cisplatin with 5FU and had complete response   07/05/2009 Procedure   Fine-needle aspirate of the right level II lymph nodes come from recurrent metastatic disease. Repeat endoscopy and CT scan show no evidence of disease elsewhere.   07/08/2009 - 12/02/2009 Chemotherapy   He was given 6 cycles of carboplatin, 5-FU and docetaxel   12/03/2009 Surgery   He has surgery to the residual lymph node on the right neck which showed no evidence of disease.   02/22/2012 Imaging   Repeat imaging study showed  large recurrent mass. He was referred elsewhere for further treatment.   05/03/2012 Surgery   He underwent left upper lobectomy.   04/29/2013 Imaging   PEt scan showed lesion on right level II B and lower lung was abnormal   06/03/2013 - 02/02/2014 Chemotherapy   He had 6 cycles of chemotherapy when he was found to have recurrence of cancer and had received oxaliplatin and capecitabine   06/07/2014 Imaging   PET CT scan showed persistent disease in the right neck lymph nodes and left lung   06/29/2014 Procedure   Accession: [id number] repeat LUL biopsy confirmed metastatic cancer   07/18/2014 - 07/31/2014 Radiation Therapy   He received palliative radiation therapy to the lungs   10/10/2014 Imaging   CT scan of the chest, abdomen and pelvis show  regression in the size of the lung nodule in the left upper lobe and stable pulmonary nodules   01/24/2015 Imaging   CT scan showed stable disease in neck and lung   06/19/2015 Imaging   CT scan of the neck and the chest show possible mild progression of the nodule in the right side of the neck.   06/25/2015 Imaging   PET scan confirmed disease recurrence in the neck   07/07/2015 Imaging   He had MRI neck [facility]   09/03/2015 - 08/26/2018 Chemotherapy   He received palliative chemo with Nivolumab   10/29/2015 Imaging   PET CT showed positive response to Rx   02/28/2016 Imaging   Ct abdomen showed abnormal thinkening in his stomach   03/03/2016 Imaging   CT: Right sternocleidomastoid muscle metastasis appears less distinct but otherwise not significantly changed in size or configuration since 06/19/2015.2. Left level 3 lymph node which was hypermetabolic by PET-CT in January 2017 appears slightly smaller   04/01/2016 Imaging   CT cervical spine showed no acute fracture or traumatic malalignment in the cervical spine   04/22/2016 Procedure   Port-a-cath placed.   06/16/2016 Imaging   Ct neck showed right sternocleidomastoid muscle metastasis is further decreased in conspicuity since May, and has mildly decreased in size since September 2016. Continued stability of sub-centimeter left cervical lymph nodes. No new or progressive metastatic disease in the neck.   06/16/2016 Imaging   CT chest showed stable masslike radiation fibrosis in the left upper lobe. Stable subcentimeter pulmonary nodules in the bilateral lower lobes. No new or progressive metastatic disease in the chest. Nonobstructing left renal stone.   10/13/2016 Imaging   Ct neck showed unchanged right sternocleidomastoid muscle metastasis. Unchanged subcentimeter left cervical lymph nodes. No evidence of new or progressive metastatic disease in the neck.   10/13/2016 Imaging   CT chest showed tiny hypervascular foci in the liver,  not definitely seen on prior imaging of 06/16/2016 and 02/28/2016. Abdomen MRI without and with contrast recommended to further evaluate as metastatic disease is a concern. 2. Stable appearance of post treatment changes left upper lung and scattered tiny bilateral pulmonary nodules.   02/11/2017 Imaging   Ct neck: Lymph node mass right posterior neck appears improved from the prior study. Small posterior lymph nodes on the left unchanged. Occluded right jugular vein unchanged.   02/11/2017 Imaging   1. Similar appearance of postsurgical and radiation changes in the left upper lobe. 2. Similar bilateral pulmonary nodules. 3. No thoracic adenopathy. 4. Subtle foci of post-contrast enhancement within the liver are suboptimally characterized on this nondedicated study. Likely similar. These could either be re-evaluated at followup or more entirely characterized with  abdominal MRI. 5. Left nephrolithiasis.   05/19/2017 Imaging   Matted lymph node mass right posterior neck appears larger in the recent CT. Accurate measurements difficult due to infiltrating tumor margins and infiltration of the muscle. Right jugular vein again appears occluded or resected. Small left posterior lymph nodes stable. Left upper lobe airspace density stable and similar to the prior CT   06/03/2017 PET scan   1. Hypermetabolic ill-defined right level IIb lymph node, about 1.3 cm in diameter with maximum SUV 9.5 (formerly 8.1). Appearance suspicious for residual/recurrent malignancy. No worrisome left-sided lesion. 2. Left suprahilar indistinct opacity demonstrates no worrisome hypermetabolic activity. The 5 mm left lower lobe pulmonary nodule is stable and not currently hypermetabolic although below sensitive PET-CT size thresholds. 3. Other imaging findings of potential clinical significance: Bilateral nonobstructive nephrolithiasis. Chronic bilateral maxillary sinusitis.   05/11/2018 PET scan   1. Continued chronic accentuated  metabolic activity in the vicinity of right level IIB and the adjacent right sternocleidomastoid muscle, with ill definition of surrounding tissue planes. Maximum SUV is currently 8.1, formerly 9.5. Accentuated metabolic activity is been present in this vicinity back through 06/25/2015, and there was also some low-level activity in this vicinity on 06/07/2014. Some of this may be from scarring and local muscular activity although clearly a component of residual tumor is difficult to exclude given the focally high activity. 2. Other imaging findings of potential clinical significance: Chronic bilateral maxillary sinusitis. Chronic scarring in the left upper lobe. Chronically stable 5 mm left lower lobe nodule is considered benign. Nonobstructive left nephrolithiasis.   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 COMMENT:No significant keratinization is identified. Other basaloid carcinomas are in the differential diagnosis. No cell block material is available for further testing.   09/12/2018 Procedure   He underwent fine Needle Aspiration   10/04/2018 PET scan   1. Significant progression of local recurrence laterally in the mid right neck with an enlarging, increasingly hypermetabolic soft tissue mass. This involves the right sternocleidomastoid muscle. 2. Small lymph nodes in the right axilla are increasingly hypermetabolic. These are nonspecific and potentially reactive, although could reflect a small metastases. Small hypermetabolic nodule in the left suprasternal notch is unchanged. 3. No other evidence of metastatic disease.     10/07/2018 - 12/23/2018 Chemotherapy   The patient had cisplatin plus gemzar   12/07/2018 Imaging   1. Decreased size of lateral right neck mass. 2. Unchanged soft tissue nodule in the suprasternal notch. 3. No evidence of  new metastatic disease in the neck.     05/30/2019 Imaging   CT neck No clear change or progression compared to the study of March. Overall measurements of the right lateral neck mass are similar, approximately 3 x 1.8 cm. See above discussion. One could argue that there is slight increase in lateral bulging, possibly with an increase in contrast enhancement, towards the inferior margin. This is of questionable validity but could possibly represent some progression or inflammatory change. Other findings in the region are stable.   07/20/2019 - 09/15/2019 Chemotherapy   The patient had gemzar and cisplatin   10/02/2019 Imaging   CT neck As compared to 05/30/2019, no significant interval change in size of an ill-defined mass within the right lateral neck, again measuring 3.3 x 1.8 cm in transaxial dimensions.   Unchanged mildly enlarged left level I lymph node measuring  1.1 cm in short axis.   Unchanged node or nodule at the thoracic inlet, measuring 1.3 x 0.8 cm.   Please refer to concurrently performed chest CT for a description of findings below the level of the thoracic inlet.     10/02/2019 Imaging   CT chest 1. No new or progressive findings in the chest to suggest metastatic disease. 2. Bilateral subcentimeter solid pulmonary nodules are stable since 2018. 3. Hyperdense 1.1 cm anterior liver focus, not clearly visualized on prior studies. Suggest MRI abdomen without and with IV contrast for further characterization.   10/20/2019 - 12/29/2019 Chemotherapy   The patient had gemzar maintenance   06/12/2020 Imaging   1. Enlarging superficial, exophytic component of the chronic right sternocleidomastoid muscle mass. See series 6, image 55. 2. Elsewhere stable CT appearance of the Neck.   06/12/2020 Imaging   Post treatment scarring in the left hemithorax, stable. No evidence recurrent or metastatic disease   06/14/2020 - 10/15/2020 Chemotherapy   He received carboplatin, 5FU and  Rande Lawman       10/31/2020 Procedure   Interval improvement in right lateral lymph node mass. Improvement in dermal component as well as invasion of the right sternocleidomastoid muscle.   10 mm submental lymph node slightly enlarged compared to the prior study. Continued follow-up recommended.   11/01/2020 - 05/22/2022 Chemotherapy   Patient is on Treatment Plan : HEAD/NECK Pembrolizumab Q21D     07/03/2022 - 10/30/2022 Chemotherapy   Patient is on Treatment Plan : Head and neck Pembrolizumab (400) q42d     11/30/2022 Imaging   CT chest  1. Stable exam. No new or progressive findings to suggest recurrent or metastatic disease. 2. Stable tiny bilateral nodules since 06/12/2020, consistent with benign etiology. 3. Punctate nonobstructing left renal stone.   12/01/2022 Imaging   CT neck  Growing right neck mass since 2022 with epidural tumor extension via the right C2-3 foramen. Cervical MRI with contrast would be contributory.   12/11/2022 - 04/02/2023 Chemotherapy   Patient is on Treatment Plan : HEAD/NECK toripalimab-tpzi D1 + cisplatin D1 + gemcitabine D1,8 q21d x 6 cycles / toripalimab-tpzi q21d (up to 21m)     04/15/2023 Imaging   CT Chest W Contrast  Result Date: 04/15/2023 CLINICAL DATA:  Right neck mass, nasopharyngeal carcinoma EXAM: CT CHEST WITH CONTRAST TECHNIQUE: Multidetector CT imaging of the chest was performed during intravenous contrast administration. RADIATION DOSE REDUCTION: This exam was performed according to the departmental dose-optimization program which includes automated exposure control, adjustment of the mA and/or kV according to patient size and/or use of iterative reconstruction technique. CONTRAST:  75mL OMNIPAQUE IOHEXOL 300 MG/ML  SOLN COMPARISON:  11/27/2022 FINDINGS: Cardiovascular: Right Port-A-Cath tip: Right atrium. Mediastinum/Nodes: Small left supraclavicular lymph node 0.5 cm in short axis on image 4 series 3, formerly the same. No pathologic  adenopathy in the chest. Lungs/Pleura: Biapical pleuroparenchymal scarring, right greater than left. Stable left upper lobe scarring and peribronchovascular density with wedge resection line in the left upper lobe, no significant contour change of density in this vicinity to suggest malignancy. Stable 5 by 4 mm left lower lobe nodule and stable 3 by 4 mm right lower lobe nodule, unchanged from at least 2021, compatible with benign etiology. No new lesion identified. Upper Abdomen: 2 mm left kidney upper pole nonobstructive renal calculus, image 43 series 7. Musculoskeletal: Unremarkable IMPRESSION: 1. Stable postoperative findings in the left upper lobe. 2. Stable small bilateral lower lobe pulmonary nodules, unchanged from at least 2021,  compatible with benign etiology. 3. 2 mm left kidney upper pole nonobstructive renal calculus. Electronically Signed   By: Gaylyn Rong M.D.   On: 04/15/2023 14:03   CT Soft Tissue Neck W Contrast  Result Date: 04/15/2023 CLINICAL DATA:  Head/neck cancer.  Last chemo 04/02/2023. EXAM: CT NECK WITH CONTRAST TECHNIQUE: Multidetector CT imaging of the neck was performed using the standard protocol following the bolus administration of intravenous contrast. RADIATION DOSE REDUCTION: This exam was performed according to the departmental dose-optimization program which includes automated exposure control, adjustment of the mA and/or kV according to patient size and/or use of iterative reconstruction technique. CONTRAST:  75mL OMNIPAQUE IOHEXOL 300 MG/ML  SOLN COMPARISON:  CT neck 11/27/2022. FINDINGS: Pharynx and larynx: There is probable mucoid debris in the right nasal cavity/nasopharynx. The nasal cavity and nasopharynx are otherwise unremarkable. The oral cavity and oropharynx are unremarkable. The parapharyngeal spaces are clear. The hypopharynx and larynx are unremarkable. The epiglottis is normal. There is no retropharyngeal collection. The airway is patent. Salivary  glands: Atrophy of the parotid and left submandibular glands is unchanged. The right submandibular gland is again not identified and may be surgically absent. Thyroid: Unremarkable. Lymph nodes: The infiltrative mass in the right neck is again seen. The mass invades through the right C2-C3 neural foramen into the epidural space resulting in mild narrowing of the thecal sac without suspected cord compression. The degree of intraspinal epidural tumor appears slightly increased compared to the study from 11/27/2022. Mass again encases the vertebral artery at the C2-C3 level (4-36), unchanged. The remainder of the infiltrative mass in the paraspinal musculature with extension into the overlying skin surface does not appear significantly changed in size or extent compared to the prior study. A 0.9 cm midline level I lymph node is unchanged going back to 2021. Otherwise, there is no new or progressive lymphadenopathy in the neck. Vascular: As above, there is encasement of the right vertebral artery at the C2-C3 level by the right neck mass. There is mild calcified plaque at the carotid bifurcations. The right IJ is occluded or sacrificed, unchanged. The left IJ is patent. A right chest wall port is partially imaged. Limited intracranial: Unremarkable. Visualized orbits: Unremarkable. Mastoids and visualized paranasal sinuses: There is mild mucosal thickening in the maxillary sinuses. The mastoid air cells and middle ear cavities are clear. Skeleton: There is no acute osseous abnormality or suspicious osseous lesion. Upper chest: Assessed on the separately dictated CT chest. Other: None. IMPRESSION: 1. Infiltrative mass again seen in the right neck centered in the paraspinal musculature with invasion through the right C2-C3 neural foramen. The bulk of the epidural tumor at this level appears slightly increased in size compared to the prior study from 11/27/2022 with probable leftward displacement of the cord but without  frank cord compression. The remainder of the mass in the right neck is otherwise not significantly changed. 2. No new or progressive lymphadenopathy in the remainder of the neck. 3. Probable mucoid debris in the right nasal cavity/nasopharynx. No suspicious mass lesion or enhancement in the nasopharynx. Electronically Signed   By: Lesia Hausen M.D.   On: 04/15/2023 14:02      05/07/2023 -  Chemotherapy   Patient is on Treatment Plan : HEAD/NECK Cetuximab q14d + Carboplatin + 5FU IVCI D1-4 q21d x 6 cycles / Cetuximab q14d       PHYSICAL EXAMINATION: ECOG PERFORMANCE STATUS: 1 - Symptomatic but completely ambulatory  Vitals:   09/10/23 1219  BP: 125/79  Pulse: 86  Resp: 18  SpO2: 98%   Filed Weights   09/10/23 1219  Weight: 185 lb 12.8 oz (84.3 kg)    GENERAL:alert, no distress and comfortable SKIN: Noted abnormal skin changes on the right side of his neck consistent with residual disease LABORATORY DATA:  I have reviewed the data as listed    Component Value Date/Time   NA 141 09/10/2023 1132   NA 139 09/22/2017 0829   K 3.1 (L) 09/10/2023 1132   K 3.5 09/22/2017 0829   CL 103 09/10/2023 1132   CO2 31 09/10/2023 1132   CO2 26 09/22/2017 0829   GLUCOSE 131 (H) 09/10/2023 1132   GLUCOSE 133 09/22/2017 0829   BUN 10 09/10/2023 1132   BUN 14.1 09/22/2017 0829   CREATININE 0.83 09/10/2023 1132   CREATININE 0.9 09/22/2017 0829   CALCIUM 8.8 (L) 09/10/2023 1132   CALCIUM 9.1 09/22/2017 0829   PROT 6.8 08/20/2023 1045   PROT 6.8 09/22/2017 0829   ALBUMIN 4.0 08/20/2023 1045   ALBUMIN 4.1 09/22/2017 0829   AST 21 08/20/2023 1045   AST 22 09/22/2017 0829   ALT 15 08/20/2023 1045   ALT 30 09/22/2017 0829   ALKPHOS 70 08/20/2023 1045   ALKPHOS 55 09/22/2017 0829   BILITOT 0.3 08/20/2023 1045   BILITOT 0.35 09/22/2017 0829   GFRNONAA >60 09/10/2023 1132   GFRAA >60 07/05/2020 0845   GFRAA >60 02/06/2019 1215    No results found for: "SPEP", "UPEP"  Lab Results   Component Value Date   WBC 3.6 (L) 08/20/2023   NEUTROABS 1.6 (L) 08/20/2023   HGB 10.9 (L) 08/20/2023   HCT 33.0 (L) 08/20/2023   MCV 90.4 08/20/2023   PLT 230 08/20/2023      Chemistry      Component Value Date/Time   NA 141 09/10/2023 1132   NA 139 09/22/2017 0829   K 3.1 (L) 09/10/2023 1132   K 3.5 09/22/2017 0829   CL 103 09/10/2023 1132   CO2 31 09/10/2023 1132   CO2 26 09/22/2017 0829   BUN 10 09/10/2023 1132   BUN 14.1 09/22/2017 0829   CREATININE 0.83 09/10/2023 1132   CREATININE 0.9 09/22/2017 0829      Component Value Date/Time   CALCIUM 8.8 (L) 09/10/2023 1132   CALCIUM 9.1 09/22/2017 0829   ALKPHOS 70 08/20/2023 1045   ALKPHOS 55 09/22/2017 0829   AST 21 08/20/2023 1045   AST 22 09/22/2017 0829   ALT 15 08/20/2023 1045   ALT 30 09/22/2017 0829   BILITOT 0.3 08/20/2023 1045   BILITOT 0.35 09/22/2017 0829

## 2023-09-10 NOTE — Assessment & Plan Note (Signed)
His magnesium level is improved He has borderline hypokalemia but not symptomatic Observe only

## 2023-09-10 NOTE — Assessment & Plan Note (Signed)
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

## 2023-09-10 NOTE — Assessment & Plan Note (Signed)
He has stable pain control Refill his prescription of methadone and oxycodone today

## 2023-09-14 ENCOUNTER — Encounter: Payer: Self-pay | Admitting: Hematology and Oncology

## 2023-09-20 ENCOUNTER — Other Ambulatory Visit: Payer: Self-pay | Admitting: Hematology and Oncology

## 2023-09-20 ENCOUNTER — Telehealth: Payer: Self-pay

## 2023-09-20 ENCOUNTER — Other Ambulatory Visit (HOSPITAL_BASED_OUTPATIENT_CLINIC_OR_DEPARTMENT_OTHER): Payer: Self-pay

## 2023-09-20 MED ORDER — OXYCODONE HCL 15 MG PO TABS
15.0000 mg | ORAL_TABLET | ORAL | 0 refills | Status: DC | PRN
  Filled 2023-09-20: qty 90, 15d supply, fill #0

## 2023-09-20 NOTE — Telephone Encounter (Signed)
He is taking the Methadone as ordered.

## 2023-09-20 NOTE — Telephone Encounter (Signed)
Called back and given below message. He verbalized understanding and will pay for the Rx if needed. He stopped gabapentin recently due to excessive sleepiness. When he has intense pain it makes him more anxious.  FYI- he said that when his pain gets really intense he feels like he is almost suffocating.

## 2023-09-20 NOTE — Telephone Encounter (Signed)
Unfortunately it Is too soon for refill based on what I see Is he taking methadone still?

## 2023-09-20 NOTE — Telephone Encounter (Signed)
Called and scheduled appt for tomorrow with Dr. Bertis Ruddy at [phone]. He is aware of appt date/time.  Appts scheduled for Friday unchanged.

## 2023-09-20 NOTE — Telephone Encounter (Signed)
I increased the dose to 15 mg Pharmacy may not fill it; he m,ight have to pay out of pocket, I do not know I sent it to [facility]

## 2023-09-20 NOTE — Telephone Encounter (Signed)
Offer him to come in tomorrow around 1040 or Wed morning in between the new patients for eval or he can wait till friday

## 2023-09-20 NOTE — Telephone Encounter (Signed)
Returned call. He is having a lot of pain and having to take more oxycodone more often to try to help with pain control. He has 3 tabs left. He is asking for oxycodone refill to [facility] please.

## 2023-09-21 ENCOUNTER — Encounter: Payer: Self-pay | Admitting: Hematology and Oncology

## 2023-09-21 ENCOUNTER — Other Ambulatory Visit (HOSPITAL_BASED_OUTPATIENT_CLINIC_OR_DEPARTMENT_OTHER): Payer: Self-pay

## 2023-09-21 ENCOUNTER — Inpatient Hospital Stay (HOSPITAL_BASED_OUTPATIENT_CLINIC_OR_DEPARTMENT_OTHER): Payer: Medicaid Other | Admitting: Hematology and Oncology

## 2023-09-21 VITALS — BP 125/83 | HR 92 | Temp 97.8°F | Resp 18 | Ht 66.0 in | Wt 185.2 lb

## 2023-09-21 DIAGNOSIS — F411 Generalized anxiety disorder: Secondary | ICD-10-CM

## 2023-09-21 DIAGNOSIS — M542 Cervicalgia: Secondary | ICD-10-CM | POA: Diagnosis not present

## 2023-09-21 DIAGNOSIS — Z7189 Other specified counseling: Secondary | ICD-10-CM | POA: Diagnosis not present

## 2023-09-21 DIAGNOSIS — C760 Malignant neoplasm of head, face and neck: Secondary | ICD-10-CM | POA: Diagnosis not present

## 2023-09-21 DIAGNOSIS — Z5111 Encounter for antineoplastic chemotherapy: Secondary | ICD-10-CM | POA: Diagnosis not present

## 2023-09-21 MED ORDER — DEXAMETHASONE 4 MG PO TABS
ORAL_TABLET | ORAL | 6 refills | Status: DC
  Filled 2023-09-21: qty 24, 34d supply, fill #0

## 2023-09-21 MED ORDER — LORAZEPAM 0.5 MG PO TABS
0.5000 mg | ORAL_TABLET | Freq: Two times a day (BID) | ORAL | 0 refills | Status: DC | PRN
  Filled 2023-09-21: qty 60, 30d supply, fill #0

## 2023-09-21 NOTE — Assessment & Plan Note (Signed)
Unfortunately, maintenance cetuximab is not working The patient have signs and symptoms of disease progression He has not received taxanes I recommend combination of carboplatin and paclitaxel as next line of treatment We discussed risk and benefits of treatment and he agreed to proceed Due to significant exposure to chemotherapy, I will prescribe upfront dose reduction He does not need upfront G-CSF support

## 2023-09-21 NOTE — Assessment & Plan Note (Signed)
He has anxiety I recommend the patient to continue taking lorazepam as needed

## 2023-09-21 NOTE — Assessment & Plan Note (Signed)
He has poorly controlled cancer associated pain I have increased the dose of his oxycodone He will continue to take oxycodone as needed along with methadone

## 2023-09-21 NOTE — Progress Notes (Signed)
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) Unfortunately, maintenance cetuximab is not working The patient have signs and symptoms of disease progression He has not received taxanes I recommend combination of carboplatin and paclitaxel as next line of treatment We discussed risk and benefits of treatment and he agreed to proceed Due to significant exposure to chemotherapy, I will prescribe upfront dose reduction He does not need upfront G-CSF support  Neck pain on right side He has poorly controlled cancer associated pain I have increased the dose of his oxycodone He will continue to take oxycodone as needed along with methadone  Anxiety, generalized He has anxiety I recommend the patient to continue taking lorazepam as needed  Orders Placed This Encounter  Procedures   CBC with Differential (Cancer Center Only)    Standing Status:   Future    Expected Date:   10/01/2023    Expiration Date:   09/30/2024   CMP (Cancer Center only)    Standing Status:   Future    Expected Date:   10/01/2023    Expiration Date:   09/30/2024   CBC with Differential (Cancer Center Only)    Standing Status:   Future    Expected Date:   10/22/2023    Expiration Date:   10/21/2024   CMP (Cancer Center only)    Standing Status:   Future    Expected Date:   10/22/2023    Expiration Date:   10/21/2024   CBC with Differential (Cancer Center Only)    Standing Status:   Future    Expected Date:   11/12/2023    Expiration Date:   11/11/2024   CMP (Cancer Center only)    Standing Status:   Future    Expected Date:   11/12/2023    Expiration Date:   11/11/2024   CBC with  Differential (Cancer Center Only)    Standing Status:   Future    Expected Date:   12/03/2023    Expiration Date:   12/02/2024   CMP (Cancer Center only)    Standing Status:   Future    Expected Date:   12/03/2023    Expiration Date:   12/02/2024    All questions were answered. The patient knows to call the clinic with any problems, questions or concerns. The total time spent in the appointment was 40 minutes encounter with patients including review of chart and various tests results, discussions about plan of care and coordination of care plan   Artis Delay, MD 09/21/2023 12:07 PM  INTERVAL HISTORY: Please see below for problem oriented charting. he returns for urgent evaluation He noted worsening pain control He has difficulties with sleep He has difficulties with muscle spasm around his neck We discussed the rationale behind change of treatment plan  REVIEW OF SYSTEMS:   Constitutional: Denies fevers, chills or abnormal weight loss Eyes: Denies blurriness of vision Ears, nose, mouth, throat, and face: Denies mucositis or sore throat Respiratory: Denies cough, dyspnea or wheezes Cardiovascular: Denies palpitation, chest discomfort or lower extremity swelling Gastrointestinal:  Denies nausea, heartburn or change in bowel habits Skin: Denies abnormal skin rashes Lymphatics: Denies new lymphadenopathy or easy bruising Neurological:Denies numbness, tingling or new  weaknesses All other systems were reviewed with the patient and are negative.  I have reviewed the past medical history, past surgical history, social history and family history with the patient and they are unchanged from previous note.  ALLERGIES:  is allergic to phenergan [promethazine hcl], heparin, and clindamycin.  MEDICATIONS:  Current Outpatient Medications  Medication Sig Dispense Refill   dexamethasone (DECADRON) 4 MG tablet Take 2 tablets by mouth the night before and 2 tablets the morning of chemotherapy,  every 3 weeks x 6 cycles 24 tablet 6   bisacodyl (DULCOLAX) 10 MG suppository Place 1 suppository (10 mg total) rectally as needed for moderate constipation. 4 suppository 0   blood glucose meter kit and supplies KIT Dispense based on patient and insurance preference. Use up to four times daily as directed. (FOR ICD-9 250.00, 250.01). 1 each 1   cetirizine (ZYRTEC) 10 MG tablet Take 1 tablet (10 mg total) by mouth daily as needed for allergies. 100 tablet 0   famotidine (PEPCID) 40 MG tablet Take 1 tablet (40 mg total) by mouth at bedtime. 30 tablet 3   gabapentin (NEURONTIN) 300 MG capsule Take 1 capsule (300 mg total) by mouth 2 (two) times daily. 60 capsule 1   levothyroxine (SYNTHROID) 175 MCG tablet Take 1 tablet (175 mcg total) by mouth daily before breakfast. 30 tablet 3   LORazepam (ATIVAN) 0.5 MG tablet Take 1 tablet (0.5 mg total) by mouth 2 (two) times daily as needed for anxiety. 60 tablet 0   metFORMIN (GLUCOPHAGE) 500 MG tablet Take 1 tablet (500 mg total) by mouth 2 (two) times daily with a meal. 60 tablet 2   methadone (DOLOPHINE) 10 MG tablet Take 1 tablet (10 mg) by mouth every 8 hours. 90 tablet 0   ondansetron (ZOFRAN) 8 MG tablet Take 1 tablet (8 mg total) by mouth every 8 (eight) hours as needed for nausea or vomiting. 90 tablet 1   oxyCODONE (ROXICODONE) 15 MG immediate release tablet Take 1 tablet (15 mg total) by mouth every 4 (four) hours as needed for severe pain (pain score 7-10). 90 tablet 0   pantoprazole (PROTONIX) 40 MG tablet Take 1 tablet (40 mg total) by mouth daily. 30 tablet 2   polyethylene glycol (MIRALAX / GLYCOLAX) 17 g packet Take 17 g by mouth 2 (two) times daily. 30 each 3   prochlorperazine (COMPAZINE) 10 MG tablet Take 1 tablet (10 mg total) by mouth every 6 (six) hours as needed for nausea or vomiting. 30 tablet 2   senna-docusate (SENOKOT-S) 8.6-50 MG tablet Take 2 tablets by mouth 2 (two) times daily. 100 tablet 3   No current facility-administered  medications for this visit.    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 Pan-endoscopy elsewhere showed cancer from nasopharyngeal space.   01/04/2007 - 02/20/2007 Chemotherapy   He received 2 cycles of cisplatin and 5FU followed by concurrent chemo with weekly  cisplatin and radiation. He only received 2 doses of chemo due to severe mucositis, nausea and weight loss.   04/05/2007 - 08/04/2007 Chemotherapy   He received 4 more courses of cisplatin with 5FU and had complete response   07/05/2009 Procedure   Fine-needle aspirate of the right level II lymph nodes come from recurrent metastatic disease. Repeat endoscopy and CT scan show no evidence of disease elsewhere.   07/08/2009 - 12/02/2009 Chemotherapy   He was given 6 cycles of carboplatin, 5-FU and docetaxel   12/03/2009 Surgery   He has surgery to the residual lymph node on the right neck which showed no evidence of disease.   02/22/2012 Imaging   Repeat imaging study showed large recurrent mass. He was referred elsewhere for further treatment.   05/03/2012 Surgery   He underwent left upper lobectomy.   04/29/2013 Imaging   PEt scan showed lesion on right level II B and lower lung was abnormal   06/03/2013 - 02/02/2014 Chemotherapy   He had 6 cycles of chemotherapy when he was found to have recurrence of cancer and had received oxaliplatin and capecitabine   06/07/2014  Imaging   PET CT scan showed persistent disease in the right neck lymph nodes and left lung   06/29/2014 Procedure   Accession: [id number] repeat LUL biopsy confirmed metastatic cancer   07/18/2014 - 07/31/2014 Radiation Therapy   He received palliative radiation therapy to the lungs   10/10/2014 Imaging   CT scan of the chest, abdomen and pelvis show regression in the size of the lung nodule in the left upper lobe and stable pulmonary nodules   01/24/2015 Imaging   CT scan showed stable disease in neck and lung   06/19/2015 Imaging   CT scan of the neck and the chest show possible mild progression of the nodule in the right side of the neck.   06/25/2015 Imaging   PET scan confirmed disease recurrence in the neck   07/07/2015 Imaging   [facility]   09/03/2015 - 08/26/2018 Chemotherapy   He received palliative chemo with Nivolumab   10/29/2015 Imaging   PET CT showed positive response to Rx   02/28/2016 Imaging   Ct abdomen showed abnormal thinkening in his stomach   03/03/2016 Imaging   CT: Right sternocleidomastoid muscle metastasis appears less distinct but otherwise not significantly changed in size or configuration since 06/19/2015.2. Left level 3 lymph node which was hypermetabolic by PET-CT in January 2017 appears slightly smaller   04/01/2016 Imaging   CT cervical spine showed no acute fracture or traumatic malalignment in the cervical spine   04/22/2016 Procedure   Port-a-cath placed.   06/16/2016 Imaging   Ct neck showed right sternocleidomastoid muscle metastasis is further decreased in conspicuity since May, and has mildly decreased in size since September 2016. Continued stability of sub-centimeter left cervical lymph nodes. No new or progressive metastatic disease in the neck.   06/16/2016 Imaging   CT chest showed stable masslike radiation fibrosis in the left upper lobe. Stable subcentimeter pulmonary nodules in the bilateral lower lobes. No new or  progressive metastatic disease in the chest. Nonobstructing left renal stone.   10/13/2016 Imaging   Ct neck showed unchanged right sternocleidomastoid muscle metastasis. Unchanged subcentimeter left cervical lymph nodes. No evidence of new or progressive metastatic disease in the neck.   10/13/2016 Imaging   CT chest showed tiny hypervascular foci in the liver, not definitely seen on prior imaging of 06/16/2016 and 02/28/2016. Abdomen  MRI without and with contrast recommended to further evaluate as metastatic disease is a concern. 2. Stable appearance of post treatment changes left upper lung and scattered tiny bilateral pulmonary nodules.   02/11/2017 Imaging   Ct neck: Lymph node mass right posterior neck appears improved from the prior study. Small posterior lymph nodes on the left unchanged. Occluded right jugular vein unchanged.   02/11/2017 Imaging   1. Similar appearance of postsurgical and radiation changes in the left upper lobe. 2. Similar bilateral pulmonary nodules. 3. No thoracic adenopathy. 4. Subtle foci of post-contrast enhancement within the liver are suboptimally characterized on this nondedicated study. Likely similar. These could either be re-evaluated at followup or more entirely characterized with abdominal MRI. 5. Left nephrolithiasis.   05/19/2017 Imaging   Matted lymph node mass right posterior neck appears larger in the recent CT. Accurate measurements difficult due to infiltrating tumor margins and infiltration of the muscle. Right jugular vein again appears occluded or resected. Small left posterior lymph nodes stable. Left upper lobe airspace density stable and similar to the prior CT   06/03/2017 PET scan   1. Hypermetabolic ill-defined right level IIb lymph node, about 1.3 cm in diameter with maximum SUV 9.5 (formerly 8.1). Appearance suspicious for residual/recurrent malignancy. No worrisome left-sided lesion. 2. Left suprahilar indistinct opacity demonstrates no  worrisome hypermetabolic activity. The 5 mm left lower lobe pulmonary nodule is stable and not currently hypermetabolic although below sensitive PET-CT size thresholds. 3. Other imaging findings of potential clinical significance: Bilateral nonobstructive nephrolithiasis. Chronic bilateral maxillary sinusitis.   05/11/2018 PET scan   1. Continued chronic accentuated metabolic activity in the vicinity of right level IIB and the adjacent right sternocleidomastoid muscle, with ill definition of surrounding tissue planes. Maximum SUV is currently 8.1, formerly 9.5. Accentuated metabolic activity is been present in this vicinity back through 06/25/2015, and there was also some low-level activity in this vicinity on 06/07/2014. Some of this may be from scarring and local muscular activity although clearly a component of residual tumor is difficult to exclude given the focally high activity. 2. Other imaging findings of potential clinical significance: Chronic bilateral maxillary sinusitis. Chronic scarring in the left upper lobe. Chronically stable 5 mm left lower lobe nodule is considered benign. Nonobstructive left nephrolithiasis.   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 COMMENT:No significant keratinization is identified. Other basaloid carcinomas are in the differential diagnosis. No cell block material is available for further testing.   09/12/2018 Procedure   He underwent fine Needle Aspiration   10/04/2018 PET scan   1. Significant progression of local recurrence laterally in the mid right neck with an enlarging, increasingly hypermetabolic soft tissue mass. This involves the right sternocleidomastoid muscle. 2. Small lymph nodes in the right axilla are increasingly hypermetabolic. These are nonspecific and potentially reactive, although could  reflect a small metastases. Small hypermetabolic nodule in the left suprasternal notch is unchanged. 3. No other evidence of metastatic disease.     10/07/2018 - 12/23/2018 Chemotherapy   The patient had cisplatin plus gemzar   12/07/2018 Imaging   1. Decreased size of lateral right neck mass. 2. Unchanged soft tissue nodule in the suprasternal notch. 3. No evidence of new metastatic disease in the neck.     05/30/2019 Imaging   CT neck No clear change or progression compared to the study of March.  Overall measurements of the right lateral neck mass are similar, approximately 3 x 1.8 cm. See above discussion. One could argue that there is slight increase in lateral bulging, possibly with an increase in contrast enhancement, towards the inferior margin. This is of questionable validity but could possibly represent some progression or inflammatory change. Other findings in the region are stable.   07/20/2019 - 09/15/2019 Chemotherapy   The patient had gemzar and cisplatin   10/02/2019 Imaging   CT neck As compared to 05/30/2019, no significant interval change in size of an ill-defined mass within the right lateral neck, again measuring 3.3 x 1.8 cm in transaxial dimensions.   Unchanged mildly enlarged left level I lymph node measuring 1.1 cm in short axis.   Unchanged node or nodule at the thoracic inlet, measuring 1.3 x 0.8 cm.   Please refer to concurrently performed chest CT for a description of findings below the level of the thoracic inlet.     10/02/2019 Imaging   CT chest 1. No new or progressive findings in the chest to suggest metastatic disease. 2. Bilateral subcentimeter solid pulmonary nodules are stable since 2018. 3. Hyperdense 1.1 cm anterior liver focus, not clearly visualized on prior studies. Suggest MRI abdomen without and with IV contrast for further characterization.   10/20/2019 - 12/29/2019 Chemotherapy   The patient had gemzar maintenance   06/12/2020 Imaging   1.  Enlarging superficial, exophytic component of the chronic right sternocleidomastoid muscle mass. See series 6, image 55. 2. Elsewhere stable CT appearance of the Neck.   06/12/2020 Imaging   Post treatment scarring in the left hemithorax, stable. No evidence recurrent or metastatic disease   06/14/2020 - 10/15/2020 Chemotherapy   He received carboplatin, 5FU and Rande Lawman       10/31/2020 Procedure   Interval improvement in right lateral lymph node mass. Improvement in dermal component as well as invasion of the right sternocleidomastoid muscle.   10 mm submental lymph node slightly enlarged compared to the prior study. Continued follow-up recommended.   11/01/2020 - 05/22/2022 Chemotherapy   Patient is on Treatment Plan : HEAD/NECK Pembrolizumab Q21D     07/03/2022 - 10/30/2022 Chemotherapy   Patient is on Treatment Plan : Head and neck Pembrolizumab (400) q42d     11/30/2022 Imaging   CT chest  1. Stable exam. No new or progressive findings to suggest recurrent or metastatic disease. 2. Stable tiny bilateral nodules since 06/12/2020, consistent with benign etiology. 3. Punctate nonobstructing left renal stone.   12/01/2022 Imaging   CT neck  Growing right neck mass since 2022 with epidural tumor extension via the right C2-3 foramen. Cervical MRI with contrast would be contributory.   12/11/2022 - 04/02/2023 Chemotherapy   Patient is on Treatment Plan : HEAD/NECK toripalimab-tpzi D1 + cisplatin D1 + gemcitabine D1,8 q21d x 6 cycles / toripalimab-tpzi q21d (up to 65m)     04/15/2023 Imaging   CT Chest W Contrast  Result Date: 04/15/2023 CLINICAL DATA:  Right neck mass, nasopharyngeal carcinoma EXAM: CT CHEST WITH CONTRAST TECHNIQUE: Multidetector CT imaging of the chest was performed during intravenous contrast administration. RADIATION DOSE REDUCTION: This exam was performed according to the departmental dose-optimization program which includes automated exposure control, adjustment of  the mA and/or kV according to patient size and/or use of iterative reconstruction technique. CONTRAST:  75mL OMNIPAQUE IOHEXOL 300 MG/ML  SOLN COMPARISON:  11/27/2022 FINDINGS: Cardiovascular: Right Port-A-Cath tip: Right atrium. Mediastinum/Nodes: Small left supraclavicular lymph node 0.5 cm in short axis on  image 4 series 3, formerly the same. No pathologic adenopathy in the chest. Lungs/Pleura: Biapical pleuroparenchymal scarring, right greater than left. Stable left upper lobe scarring and peribronchovascular density with wedge resection line in the left upper lobe, no significant contour change of density in this vicinity to suggest malignancy. Stable 5 by 4 mm left lower lobe nodule and stable 3 by 4 mm right lower lobe nodule, unchanged from at least 2021, compatible with benign etiology. No new lesion identified. Upper Abdomen: 2 mm left kidney upper pole nonobstructive renal calculus, image 43 series 7. Musculoskeletal: Unremarkable IMPRESSION: 1. Stable postoperative findings in the left upper lobe. 2. Stable small bilateral lower lobe pulmonary nodules, unchanged from at least 2021, compatible with benign etiology. 3. 2 mm left kidney upper pole nonobstructive renal calculus. Electronically Signed   By: Gaylyn Rong M.D.   On: 04/15/2023 14:03   CT Soft Tissue Neck W Contrast  Result Date: 04/15/2023 CLINICAL DATA:  Head/neck cancer.  Last chemo 04/02/2023. EXAM: CT NECK WITH CONTRAST TECHNIQUE: Multidetector CT imaging of the neck was performed using the standard protocol following the bolus administration of intravenous contrast. RADIATION DOSE REDUCTION: This exam was performed according to the departmental dose-optimization program which includes automated exposure control, adjustment of the mA and/or kV according to patient size and/or use of iterative reconstruction technique. CONTRAST:  75mL OMNIPAQUE IOHEXOL 300 MG/ML  SOLN COMPARISON:  CT neck 11/27/2022. FINDINGS: Pharynx and larynx:  There is probable mucoid debris in the right nasal cavity/nasopharynx. The nasal cavity and nasopharynx are otherwise unremarkable. The oral cavity and oropharynx are unremarkable. The parapharyngeal spaces are clear. The hypopharynx and larynx are unremarkable. The epiglottis is normal. There is no retropharyngeal collection. The airway is patent. Salivary glands: Atrophy of the parotid and left submandibular glands is unchanged. The right submandibular gland is again not identified and may be surgically absent. Thyroid: Unremarkable. Lymph nodes: The infiltrative mass in the right neck is again seen. The mass invades through the right C2-C3 neural foramen into the epidural space resulting in mild narrowing of the thecal sac without suspected cord compression. The degree of intraspinal epidural tumor appears slightly increased compared to the study from 11/27/2022. Mass again encases the vertebral artery at the C2-C3 level (4-36), unchanged. The remainder of the infiltrative mass in the paraspinal musculature with extension into the overlying skin surface does not appear significantly changed in size or extent compared to the prior study. A 0.9 cm midline level I lymph node is unchanged going back to 2021. Otherwise, there is no new or progressive lymphadenopathy in the neck. Vascular: As above, there is encasement of the right vertebral artery at the C2-C3 level by the right neck mass. There is mild calcified plaque at the carotid bifurcations. The right IJ is occluded or sacrificed, unchanged. The left IJ is patent. A right chest wall port is partially imaged. Limited intracranial: Unremarkable. Visualized orbits: Unremarkable. Mastoids and visualized paranasal sinuses: There is mild mucosal thickening in the maxillary sinuses. The mastoid air cells and middle ear cavities are clear. Skeleton: There is no acute osseous abnormality or suspicious osseous lesion. Upper chest: Assessed on the separately dictated CT  chest. Other: None. IMPRESSION: 1. Infiltrative mass again seen in the right neck centered in the paraspinal musculature with invasion through the right C2-C3 neural foramen. The bulk of the epidural tumor at this level appears slightly increased in size compared to the prior study from 11/27/2022 with probable leftward displacement of the cord but  without frank cord compression. The remainder of the mass in the right neck is otherwise not significantly changed. 2. No new or progressive lymphadenopathy in the remainder of the neck. 3. Probable mucoid debris in the right nasal cavity/nasopharynx. No suspicious mass lesion or enhancement in the nasopharynx. Electronically Signed   By: Lesia Hausen M.D.   On: 04/15/2023 14:02      05/07/2023 - 09/10/2023 Chemotherapy   Patient is on Treatment Plan : HEAD/NECK Cetuximab q14d + Carboplatin + 5FU IVCI D1-4 q21d x 6 cycles / Cetuximab q14d     10/01/2023 -  Chemotherapy   Patient is on Treatment Plan : HEAD/NECK Carboplatin + Paclitaxel q21d       PHYSICAL EXAMINATION: ECOG PERFORMANCE STATUS: 1 - Symptomatic but completely ambulatory  Vitals:   09/21/23 1100  BP: 125/83  Pulse: 92  Resp: 18  Temp: 97.8 F (36.6 C)  SpO2: 100%   Filed Weights   09/21/23 1100  Weight: 185 lb 3.2 oz (84 kg)    GENERAL:alert, no distress and comfortable SKIN: If signs of disease progression on the right side of his neck NEURO: alert & oriented x 3 with fluent speech, no focal motor/sensory deficits  LABORATORY DATA:  I have reviewed the data as listed    Component Value Date/Time   NA 141 09/10/2023 1132   NA 139 09/22/2017 0829   K 3.1 (L) 09/10/2023 1132   K 3.5 09/22/2017 0829   CL 103 09/10/2023 1132   CO2 31 09/10/2023 1132   CO2 26 09/22/2017 0829   GLUCOSE 131 (H) 09/10/2023 1132   GLUCOSE 133 09/22/2017 0829   BUN 10 09/10/2023 1132   BUN 14.1 09/22/2017 0829   CREATININE 0.83 09/10/2023 1132   CREATININE 0.9 09/22/2017 0829   CALCIUM 8.8  (L) 09/10/2023 1132   CALCIUM 9.1 09/22/2017 0829   PROT 6.8 08/20/2023 1045   PROT 6.8 09/22/2017 0829   ALBUMIN 4.0 08/20/2023 1045   ALBUMIN 4.1 09/22/2017 0829   AST 21 08/20/2023 1045   AST 22 09/22/2017 0829   ALT 15 08/20/2023 1045   ALT 30 09/22/2017 0829   ALKPHOS 70 08/20/2023 1045   ALKPHOS 55 09/22/2017 0829   BILITOT 0.3 08/20/2023 1045   BILITOT 0.35 09/22/2017 0829   GFRNONAA >60 09/10/2023 1132   GFRAA >60 07/05/2020 0845   GFRAA >60 02/06/2019 1215    No results found for: "SPEP", "UPEP"  Lab Results  Component Value Date   WBC 3.6 (L) 08/20/2023   NEUTROABS 1.6 (L) 08/20/2023   HGB 10.9 (L) 08/20/2023   HCT 33.0 (L) 08/20/2023   MCV 90.4 08/20/2023   PLT 230 08/20/2023      Chemistry      Component Value Date/Time   NA 141 09/10/2023 1132   NA 139 09/22/2017 0829   K 3.1 (L) 09/10/2023 1132   K 3.5 09/22/2017 0829   CL 103 09/10/2023 1132   CO2 31 09/10/2023 1132   CO2 26 09/22/2017 0829   BUN 10 09/10/2023 1132   BUN 14.1 09/22/2017 0829   CREATININE 0.83 09/10/2023 1132   CREATININE 0.9 09/22/2017 0829      Component Value Date/Time   CALCIUM 8.8 (L) 09/10/2023 1132   CALCIUM 9.1 09/22/2017 0829   ALKPHOS 70 08/20/2023 1045   ALKPHOS 55 09/22/2017 0829   AST 21 08/20/2023 1045   AST 22 09/22/2017 0829   ALT 15 08/20/2023 1045   ALT 30 09/22/2017 0829   BILITOT 0.3  08/20/2023 1045   BILITOT 0.35 09/22/2017 0829

## 2023-09-22 ENCOUNTER — Encounter: Payer: Self-pay | Admitting: Hematology and Oncology

## 2023-09-22 ENCOUNTER — Other Ambulatory Visit: Payer: Self-pay

## 2023-09-23 ENCOUNTER — Other Ambulatory Visit: Payer: Self-pay

## 2023-09-23 NOTE — Progress Notes (Signed)

## 2023-09-24 ENCOUNTER — Other Ambulatory Visit: Payer: Medicaid Other

## 2023-09-24 ENCOUNTER — Ambulatory Visit: Payer: Medicaid Other

## 2023-09-24 ENCOUNTER — Ambulatory Visit: Payer: Medicaid Other | Admitting: Hematology and Oncology

## 2023-09-30 ENCOUNTER — Other Ambulatory Visit: Payer: Self-pay

## 2023-09-30 MED FILL — Fosaprepitant Dimeglumine For IV Infusion 150 MG (Base Eq): INTRAVENOUS | Qty: 5 | Status: AC

## 2023-10-01 ENCOUNTER — Inpatient Hospital Stay: Payer: Medicaid Other

## 2023-10-01 ENCOUNTER — Other Ambulatory Visit: Payer: Self-pay

## 2023-10-01 ENCOUNTER — Other Ambulatory Visit (HOSPITAL_COMMUNITY): Payer: Self-pay

## 2023-10-01 VITALS — BP 113/70 | HR 72 | Temp 98.2°F | Resp 18 | Wt 185.0 lb

## 2023-10-01 DIAGNOSIS — C7800 Secondary malignant neoplasm of unspecified lung: Secondary | ICD-10-CM

## 2023-10-01 DIAGNOSIS — Z5111 Encounter for antineoplastic chemotherapy: Secondary | ICD-10-CM | POA: Diagnosis not present

## 2023-10-01 DIAGNOSIS — Z95828 Presence of other vascular implants and grafts: Secondary | ICD-10-CM

## 2023-10-01 DIAGNOSIS — C760 Malignant neoplasm of head, face and neck: Secondary | ICD-10-CM

## 2023-10-01 DIAGNOSIS — Z7189 Other specified counseling: Secondary | ICD-10-CM

## 2023-10-01 LAB — CMP (CANCER CENTER ONLY)
ALT: 15 U/L (ref 0–44)
AST: 22 U/L (ref 15–41)
Albumin: 4.3 g/dL (ref 3.5–5.0)
Alkaline Phosphatase: 64 U/L (ref 38–126)
Anion gap: 7 (ref 5–15)
BUN: 13 mg/dL (ref 6–20)
CO2: 28 mmol/L (ref 22–32)
Calcium: 9.3 mg/dL (ref 8.9–10.3)
Chloride: 102 mmol/L (ref 98–111)
Creatinine: 0.78 mg/dL (ref 0.61–1.24)
GFR, Estimated: >60 mL/min (ref >60)
Glucose, Bld: 257 mg/dL — ABNORMAL HIGH (ref 70–99)
Potassium: 3.8 mmol/L (ref 3.5–5.1)
Sodium: 137 mmol/L (ref 135–145)
Total Bilirubin: 0.3 mg/dL (ref <1.2)
Total Protein: 7.3 g/dL (ref 6.5–8.1)

## 2023-10-01 LAB — CBC WITH DIFFERENTIAL (CANCER CENTER ONLY)
Abs Immature Granulocytes: 0.02 10*3/uL (ref 0.00–0.07)
Basophils Absolute: 0 10*3/uL (ref 0.0–0.1)
Basophils Relative: 0 %
Eosinophils Absolute: 0 10*3/uL (ref 0.0–0.5)
Eosinophils Relative: 0 %
HCT: 34.1 % — ABNORMAL LOW (ref 39.0–52.0)
Hemoglobin: 11.3 g/dL — ABNORMAL LOW (ref 13.0–17.0)
Immature Granulocytes: 0 %
Lymphocytes Relative: 11 %
Lymphs Abs: 0.7 10*3/uL (ref 0.7–4.0)
MCH: 29.8 pg (ref 26.0–34.0)
MCHC: 33.1 g/dL (ref 30.0–36.0)
MCV: 90 fL (ref 80.0–100.0)
Monocytes Absolute: 0.1 10*3/uL (ref 0.1–1.0)
Monocytes Relative: 1 %
Neutro Abs: 5.5 10*3/uL (ref 1.7–7.7)
Neutrophils Relative %: 88 %
Platelet Count: 270 10*3/uL (ref 150–400)
RBC: 3.79 MIL/uL — ABNORMAL LOW (ref 4.22–5.81)
RDW: 15.6 % — ABNORMAL HIGH (ref 11.5–15.5)
WBC Count: 6.2 10*3/uL (ref 4.0–10.5)
nRBC: 0 % (ref 0.0–0.2)

## 2023-10-01 MED ORDER — CARBOPLATIN CHEMO INJECTION 600 MG/60ML
750.0000 mg | Freq: Once | INTRAVENOUS | Status: AC
  Administered 2023-10-01: 750 mg via INTRAVENOUS
  Filled 2023-10-01: qty 75

## 2023-10-01 MED ORDER — DIPHENHYDRAMINE HCL 50 MG/ML IJ SOLN
50.0000 mg | Freq: Once | INTRAMUSCULAR | Status: AC
  Administered 2023-10-01: 50 mg via INTRAVENOUS
  Filled 2023-10-01: qty 1

## 2023-10-01 MED ORDER — SODIUM CHLORIDE 0.9% FLUSH
10.0000 mL | Freq: Once | INTRAVENOUS | Status: AC
  Administered 2023-10-01: 10 mL

## 2023-10-01 MED ORDER — PALONOSETRON HCL INJECTION 0.25 MG/5ML
0.2500 mg | Freq: Once | INTRAVENOUS | Status: AC
  Administered 2023-10-01: 0.25 mg via INTRAVENOUS
  Filled 2023-10-01: qty 5

## 2023-10-01 MED ORDER — SODIUM CHLORIDE 0.9 % IV SOLN
150.0000 mg | Freq: Once | INTRAVENOUS | Status: AC
  Administered 2023-10-01: 150 mg via INTRAVENOUS
  Filled 2023-10-01: qty 150

## 2023-10-01 MED ORDER — SODIUM CHLORIDE 0.9 % IV SOLN: INTRAVENOUS | Status: DC

## 2023-10-01 MED ORDER — FAMOTIDINE IN NACL 20-0.9 MG/50ML-% IV SOLN
20.0000 mg | Freq: Once | INTRAVENOUS | Status: AC
  Administered 2023-10-01: 20 mg via INTRAVENOUS
  Filled 2023-10-01: qty 50

## 2023-10-01 MED ORDER — DEXAMETHASONE SODIUM PHOSPHATE 10 MG/ML IJ SOLN
10.0000 mg | Freq: Once | INTRAMUSCULAR | Status: AC
Start: 2023-10-01 — End: 2023-10-01
  Administered 2023-10-01: 10 mg via INTRAVENOUS
  Filled 2023-10-01: qty 1

## 2023-10-01 MED ORDER — SODIUM CHLORIDE 0.9 % IV SOLN
135.0000 mg/m2 | Freq: Once | INTRAVENOUS | Status: AC
  Administered 2023-10-01: 270 mg via INTRAVENOUS
  Filled 2023-10-01: qty 45

## 2023-10-01 NOTE — Patient Instructions (Signed)
 [facility] - A DEPT OF [facility]  Discharge Instructions: Thank you for choosing [facility] to provide your oncology and hematology care.   If you have a lab appointment with the Cancer Center, please go directly to the Cancer Center and check in at the registration area.   Wear comfortable clothing and clothing appropriate for easy access to any Portacath or PICC line.   We strive to give you quality time with your provider. You may need to reschedule your appointment if you arrive late (15 or more minutes).  Arriving late affects you and other patients whose appointments are after yours.  Also, if you miss three or more appointments without notifying the office, you may be dismissed from the clinic at the provider's discretion.      For prescription refill requests, have your pharmacy contact our office and allow 72 hours for refills to be completed.    Today you received the following chemotherapy and/or immunotherapy agents: Taxol, Carboplatin      To help prevent nausea and vomiting after your treatment, we encourage you to take your nausea medication as directed.  BELOW ARE SYMPTOMS THAT SHOULD BE REPORTED IMMEDIATELY: *FEVER GREATER THAN 100.4 F (38 C) OR HIGHER *CHILLS OR SWEATING *NAUSEA AND VOMITING THAT IS NOT CONTROLLED WITH YOUR NAUSEA MEDICATION *UNUSUAL SHORTNESS OF BREATH *UNUSUAL BRUISING OR BLEEDING *URINARY PROBLEMS (pain or burning when urinating, or frequent urination) *BOWEL PROBLEMS (unusual diarrhea, constipation, pain near the anus) TENDERNESS IN MOUTH AND THROAT WITH OR WITHOUT PRESENCE OF ULCERS (sore throat, sores in mouth, or a toothache) UNUSUAL RASH, SWELLING OR PAIN  UNUSUAL VAGINAL DISCHARGE OR ITCHING   Items with * indicate a potential emergency and should be followed up as soon as possible or go to the Emergency Department if any problems should occur.  Please show the CHEMOTHERAPY ALERT CARD or  IMMUNOTHERAPY ALERT CARD at check-in to the Emergency Department and triage nurse.  Should you have questions after your visit or need to cancel or reschedule your appointment, please contact [facility] - A DEPT OF [facility]  Dept: [phone]  and follow the prompts.  Office hours are 8:00 a.m. to 4:30 p.m. Monday - Friday. Please note that voicemails left after 4:00 p.m. may not be returned until the following business day.  We are closed weekends and major holidays. You have access to a nurse at all times for urgent questions. Please call the main number to the clinic Dept: [phone] and follow the prompts.   For any non-urgent questions, you may also contact your provider using MyChart. We now offer e-Visits for anyone 54 and older to request care online for non-urgent symptoms. For details visit [email or web address].   Also download the MyChart app! Go to the app store, search "MyChart", open the app, select [facility], and log in with your MyChart username and password.

## 2023-10-04 ENCOUNTER — Emergency Department (HOSPITAL_BASED_OUTPATIENT_CLINIC_OR_DEPARTMENT_OTHER): Payer: Medicaid Other

## 2023-10-04 ENCOUNTER — Other Ambulatory Visit: Payer: Self-pay

## 2023-10-04 ENCOUNTER — Encounter (HOSPITAL_BASED_OUTPATIENT_CLINIC_OR_DEPARTMENT_OTHER): Payer: Self-pay

## 2023-10-04 ENCOUNTER — Emergency Department (HOSPITAL_BASED_OUTPATIENT_CLINIC_OR_DEPARTMENT_OTHER)
Admission: EM | Admit: 2023-10-04 | Discharge: 2023-10-04 | Disposition: A | Discharge status: Home / Self Care | Payer: Medicaid Other | Attending: Emergency Medicine | Admitting: Emergency Medicine

## 2023-10-04 ENCOUNTER — Telehealth: Payer: Self-pay | Admitting: *Deleted

## 2023-10-04 DIAGNOSIS — M542 Cervicalgia: Secondary | ICD-10-CM | POA: Diagnosis present

## 2023-10-04 DIAGNOSIS — F419 Anxiety disorder, unspecified: Secondary | ICD-10-CM | POA: Insufficient documentation

## 2023-10-04 DIAGNOSIS — Z85818 Personal history of malignant neoplasm of other sites of lip, oral cavity, and pharynx: Secondary | ICD-10-CM | POA: Insufficient documentation

## 2023-10-04 DIAGNOSIS — Z85118 Personal history of other malignant neoplasm of bronchus and lung: Secondary | ICD-10-CM | POA: Diagnosis not present

## 2023-10-04 DIAGNOSIS — R221 Localized swelling, mass and lump, neck: Secondary | ICD-10-CM | POA: Diagnosis not present

## 2023-10-04 LAB — COMPREHENSIVE METABOLIC PANEL
ALT: 15 U/L (ref 0–44)
AST: 22 U/L (ref 15–41)
Albumin: 4.3 g/dL (ref 3.5–5.0)
Alkaline Phosphatase: 59 U/L (ref 38–126)
Anion gap: 10 (ref 5–15)
BUN: 15 mg/dL (ref 6–20)
CO2: 27 mmol/L (ref 22–32)
Calcium: 8.9 mg/dL (ref 8.9–10.3)
Chloride: 99 mmol/L (ref 98–111)
Creatinine, Ser: 0.52 mg/dL — ABNORMAL LOW (ref 0.61–1.24)
GFR, Estimated: >60 mL/min (ref >60)
Glucose, Bld: 113 mg/dL — ABNORMAL HIGH (ref 70–99)
Potassium: 3.5 mmol/L (ref 3.5–5.1)
Sodium: 136 mmol/L (ref 135–145)
Total Bilirubin: 0.5 mg/dL (ref 0.0–1.2)
Total Protein: 6.8 g/dL (ref 6.5–8.1)

## 2023-10-04 LAB — CBC WITH DIFFERENTIAL/PLATELET
Abs Immature Granulocytes: 0.03 10*3/uL (ref 0.00–0.07)
Basophils Absolute: 0 10*3/uL (ref 0.0–0.1)
Basophils Relative: 0 %
Eosinophils Absolute: 0.1 10*3/uL (ref 0.0–0.5)
Eosinophils Relative: 2 %
HCT: 34.8 % — ABNORMAL LOW (ref 39.0–52.0)
Hemoglobin: 11.2 g/dL — ABNORMAL LOW (ref 13.0–17.0)
Immature Granulocytes: 0 %
Lymphocytes Relative: 12 %
Lymphs Abs: 1 10*3/uL (ref 0.7–4.0)
MCH: 28.6 pg (ref 26.0–34.0)
MCHC: 32.2 g/dL (ref 30.0–36.0)
MCV: 89 fL (ref 80.0–100.0)
Monocytes Absolute: 0.1 10*3/uL (ref 0.1–1.0)
Monocytes Relative: 1 %
Neutro Abs: 6.9 10*3/uL (ref 1.7–7.7)
Neutrophils Relative %: 85 %
Platelets: 208 10*3/uL (ref 150–400)
RBC: 3.91 MIL/uL — ABNORMAL LOW (ref 4.22–5.81)
RDW: 15.6 % — ABNORMAL HIGH (ref 11.5–15.5)
WBC: 8.1 10*3/uL (ref 4.0–10.5)
nRBC: 0 % (ref 0.0–0.2)

## 2023-10-04 MED ORDER — IOHEXOL 300 MG/ML  SOLN
75.0000 mL | Freq: Once | INTRAMUSCULAR | Status: AC | PRN
  Administered 2023-10-04: 75 mL via INTRAVENOUS

## 2023-10-04 MED ORDER — KETOROLAC TROMETHAMINE 15 MG/ML IJ SOLN
15.0000 mg | Freq: Once | INTRAMUSCULAR | Status: AC
  Administered 2023-10-04: 15 mg via INTRAVENOUS
  Filled 2023-10-04: qty 1

## 2023-10-04 MED ORDER — HYDROMORPHONE HCL 1 MG/ML IJ SOLN
1.0000 mg | Freq: Once | INTRAMUSCULAR | Status: AC
  Administered 2023-10-04: 1 mg via INTRAVENOUS
  Filled 2023-10-04: qty 1

## 2023-10-04 MED ORDER — HEPARIN SOD (PORK) LOCK FLUSH 100 UNIT/ML IV SOLN
INTRAVENOUS | Status: AC
  Filled 2023-10-04: qty 5

## 2023-10-04 NOTE — ED Notes (Signed)
Pt discharged home and given discharge paperwork. Opportunities given for questions. Pt verbalizes understanding. Port deaccessed. Hep locked and pressure dressing applied. Jillyn Hidden , RN

## 2023-10-04 NOTE — ED Triage Notes (Signed)
Pt c/o neck pain & swelling, states it started after chemo on Friday. States at times, "I feel like my throat is closing." Airway intact, managing secretions at time of triage.

## 2023-10-04 NOTE — Discharge Instructions (Signed)
Take your medicines as prescribed.  Follow-up closely with Dr. Bertis Ruddy.  If you develop new or worsening pain, swelling, trouble breathing or swallowing, or any other new/concerning symptoms then return to the ER or call 911.

## 2023-10-04 NOTE — ED Provider Notes (Signed)
[facility] EMERGENCY DEPARTMENT AT [facility] Provider Note   CSN: [id number] Arrival date & time: 10/04/23  1324     History  Chief Complaint  Patient presents with   Neck Pain    Christian Simmons is a 37 y.o. male.  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  He received his last dose of chemotherapy 3 days ago and woke up the next day and was having increased pain to his neck and swelling to the left side of his neck.  He reports the cancer is on the right side of his neck.  He feels like he is now having decreased range of motion of his neck.  Symptoms are worsening and he feels like the swelling is worsening and this is causing him to feel like he cannot breathe.  He feels like he is choking.  He is able to swallow.  No fevers or trauma.  He has been taking his oxycodone without relief.  Today he feels like he is developing a panic attack and anxiety from this.  Home Medications Prior to Admission medications   Medication Sig Start Date End Date Taking? Authorizing Provider  bisacodyl (DULCOLAX) 10 MG suppository Place 1 suppository (10 mg total) rectally as needed for moderate constipation. 06/18/23   Artis Delay, MD  blood glucose meter kit and supplies KIT Dispense based on patient and insurance preference. Use up to four times daily as directed. (FOR ICD-9 250.00, 250.01). 11/11/20   Artis Delay, MD  cetirizine (ZYRTEC) 10 MG tablet Take 1 tablet (10 mg total) by mouth daily as needed for allergies. 07/21/21   Artis Delay, MD  dexamethasone (DECADRON) 4 MG tablet Take 2 tablets by mouth the night before and 2 tablets the morning of chemotherapy, every 3 weeks x 6 cycles 09/21/23   Artis Delay, MD  famotidine (PEPCID) 40 MG tablet Take 1 tablet (40 mg total) by mouth at bedtime. 03/11/23   Artis Delay, MD  gabapentin (NEURONTIN) 300 MG capsule Take 1 capsule (300 mg total) by mouth 2  (two) times daily. 08/27/23   Artis Delay, MD  levothyroxine (SYNTHROID) 175 MCG tablet Take 1 tablet (175 mcg total) by mouth daily before breakfast. 02/05/23   Artis Delay, MD  LORazepam (ATIVAN) 0.5 MG tablet Take 1 tablet (0.5 mg total) by mouth 2 (two) times daily as needed for anxiety. 09/21/23   Artis Delay, MD  metFORMIN (GLUCOPHAGE) 500 MG tablet Take 1 tablet (500 mg total) by mouth 2 (two) times daily with a meal. 01/29/23   Artis Delay, MD  methadone (DOLOPHINE) 10 MG tablet Take 1 tablet (10 mg) by mouth every 8 hours. 09/10/23   Artis Delay, MD  ondansetron (ZOFRAN) 8 MG tablet Take 1 tablet (8 mg total) by mouth every 8 (eight) hours as needed for nausea or vomiting. 08/20/23   Artis Delay, MD  oxyCODONE (ROXICODONE) 15 MG immediate release tablet Take 1 tablet (15 mg total) by mouth every 4 (four) hours as needed for severe pain (pain score 7-10). 09/20/23   Artis Delay, MD  pantoprazole (PROTONIX) 40 MG tablet Take 1 tablet (40 mg total) by mouth daily. 08/13/23   Artis Delay, MD  polyethylene glycol (MIRALAX / GLYCOLAX) 17 g packet Take 17 g by mouth 2 (two) times daily. 09/10/23   Artis Delay, MD  prochlorperazine (COMPAZINE) 10 MG tablet Take 1 tablet (10 mg total) by mouth every 6 (  six) hours as needed for nausea or vomiting. 05/07/23   Artis Delay, MD  senna-docusate (SENOKOT-S) 8.6-50 MG tablet Take 2 tablets by mouth 2 (two) times daily. 11/20/22   Artis Delay, MD      Allergies    Phenergan [promethazine hcl], Heparin, and Clindamycin    Review of Systems   Review of Systems  Constitutional:  Negative for fever.  HENT:  Negative for sore throat and trouble swallowing.   Respiratory:  Positive for shortness of breath.   Musculoskeletal:  Positive for neck pain and neck stiffness.    Physical Exam Updated Vital Signs BP 122/82   Pulse 89   Temp 97.8 F (36.6 C)   Resp 19   SpO2 100%  Physical Exam Vitals and nursing note reviewed.  Constitutional:      Appearance: He  is well-developed. He is not diaphoretic.     Comments: Anxious, appears in pain  HENT:     Head: Normocephalic and atraumatic.     Mouth/Throat:     Pharynx: Oropharynx is clear. No posterior oropharyngeal erythema.  Neck:      Comments: Minimal tenderness to left neck. Cardiovascular:     Rate and Rhythm: Normal rate and regular rhythm.     Heart sounds: Normal heart sounds.  Pulmonary:     Effort: Pulmonary effort is normal. No accessory muscle usage.     Breath sounds: Normal breath sounds. No stridor. No wheezing.  Abdominal:     General: There is no distension.     Palpations: Abdomen is soft.  Musculoskeletal:     Cervical back: No erythema. Decreased range of motion.  Skin:    General: Skin is warm and dry.  Neurological:     Mental Status: He is alert.     ED Results / Procedures / Treatments   Labs (all labs ordered are listed, but only abnormal results are displayed) Labs Reviewed  COMPREHENSIVE METABOLIC PANEL - Abnormal; Notable for the following components:      Result Value   Glucose, Bld 113 (*)    Creatinine, Ser 0.52 (*)    All other components within normal limits  CBC WITH DIFFERENTIAL/PLATELET - Abnormal; Notable for the following components:   RBC 3.91 (*)    Hemoglobin 11.2 (*)    HCT 34.8 (*)    RDW 15.6 (*)    All other components within normal limits    EKG None  Radiology CT Soft Tissue Neck W Contrast Result Date: 10/04/2023 CLINICAL DATA:  Neck mass, nonpulsatile neck swelling EXAM: CT NECK WITH CONTRAST TECHNIQUE: Multidetector CT imaging of the neck was performed using the standard protocol following the bolus administration of intravenous contrast. RADIATION DOSE REDUCTION: This exam was performed according to the departmental dose-optimization program which includes automated exposure control, adjustment of the mA and/or kV according to patient size and/or use of iterative reconstruction technique. CONTRAST:  75mL OMNIPAQUE IOHEXOL  300 MG/ML  SOLN COMPARISON:  CT neck 04/13/2023. FINDINGS: Pharynx and larynx: Unremarkable nasopharynx. Salivary glands: Atrophic.  No evidence of acute abnormality. Thyroid: Normal. Lymph nodes: The infiltrative mass in the right neck is slightly decreased in size/bulk, now measuring approximately 3.7 x 2.8 cm. 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 The tumor extends superficially to the skin surface, similar to prior. Mildly increased central areas of necrosis/cystic change within the mass. Stable midline 9 mm level I lymph node. No new or progressive  lymphadenopathy. 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 Calcific atherosclerosis. Limited intracranial: No obvious acute abnormality on limited assessment and partial visualization. Visualized orbits: Negative. Mastoids and visualized paranasal sinuses: Clear. Skeleton: No acute or aggressive process. Upper chest: Chronic postoperative changes in the left upper lobe, better characterized on prior CT of the chest. IMPRESSION: 1. 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 2. No new or progressive lymphadenopathy. Electronically Signed   By: Feliberto Harts M.D.   On: 10/04/2023 17:15    Procedures Procedures    Medications Ordered in ED Medications  HYDROmorphone (DILAUDID) injection 1 mg (1 mg Intravenous Given 10/04/23 1602)  iohexol (OMNIPAQUE) 300 MG/ML solution 75 mL (75 mLs Intravenous Contrast Given 10/04/23 1645)  ketorolac (TORADOL) 15 MG/ML injection 15 mg (15 mg Intravenous Given 10/04/23 1750)    ED Course/ Medical Decision Making/ A&P                                 Medical Decision Making Amount and/or Complexity of Data Reviewed External Data Reviewed: notes. Labs: ordered.    Details: Normal WBC Radiology: ordered and  independent interpretation performed.    Details: No new mass  Risk Prescription drug management.   Unclear what is causing the patient's pain.  He is feeling better and is now requesting discharge after negative workup.  On reexamination he does not have any new swelling.  I suspect he does not have actual swelling on exam but rather his right neck seems a little smaller compared to the left.  Either way, CT is reassuring and his pain is better.  He would like to be discharged and currently appears stable to do so and to follow-up with his oncologist as a outpatient.        Final Clinical Impression(s) / ED Diagnoses Final diagnoses:  Neck pain on left side    Rx / DC Orders ED Discharge Orders     None         Pricilla Loveless, MD 10/04/23 1752

## 2023-10-04 NOTE — Telephone Encounter (Signed)
Pt called with concerns for extreme pain and has not slept in 48 hours. Pt stated pain medication isn't working and that his neck and still and can't move. Recommended pt to go to nearest ER to be evaluated further. Pt verbalized understanding

## 2023-10-07 ENCOUNTER — Other Ambulatory Visit: Payer: Self-pay | Admitting: Hematology and Oncology

## 2023-10-07 ENCOUNTER — Other Ambulatory Visit (HOSPITAL_BASED_OUTPATIENT_CLINIC_OR_DEPARTMENT_OTHER): Payer: Self-pay

## 2023-10-07 ENCOUNTER — Telehealth: Payer: Self-pay

## 2023-10-07 MED ORDER — OXYCODONE HCL 15 MG PO TABS
15.0000 mg | ORAL_TABLET | ORAL | 0 refills | Status: DC | PRN
  Filled 2023-10-07: qty 90, 15d supply, fill #0

## 2023-10-07 NOTE — Telephone Encounter (Signed)
 Called back and given below message. He verbalized understanding and is taking Gabapentin as prescribed.   Instructed to go to the ER for worsening symptoms or call the office back. He verbalized understanding.

## 2023-10-07 NOTE — Telephone Encounter (Signed)
-----   Message from Artis Delay sent at 10/07/2023  9:05 AM EST ----- He went to the ER 2 days ago Can you call him and ask if he is better?

## 2023-10-07 NOTE — Telephone Encounter (Signed)
 Sent refill His treatment can cause neuropathy (numbness and cold)

## 2023-10-07 NOTE — Telephone Encounter (Signed)
 Called to see how he is doing today. He is feeling better than when he went to the ER. He is complaining of left sided neck pain, stiffness and muscle pain. He is complaining that yesterday he started having numbness and cold sensation from the knee down that may get better when he lays down. He is taking pain medication and ativan  as prescribed. At times he has a sensation that he is suffocating. He is requesting a Oxycodone  refill to [facility].  Ask him if he felt he needed to go to the ER. He said that he does not want to go the the ER and is asking if the recent treatment could be causing his complaints.

## 2023-10-07 NOTE — Telephone Encounter (Signed)
 Called and left a message asking him to call the office back with update on how he is doing.

## 2023-10-08 ENCOUNTER — Telehealth: Payer: Self-pay

## 2023-10-08 NOTE — Telephone Encounter (Signed)
 He called to give update on how he is feeling. His neck is feeling better. He is able to move his neck today and exercise some. The tumor has shrunk down and the site looks flat. Anxiety is better today. The numbness and coldness to his lower legs is better.  He will call the office back for questions/concerns.  FYI

## 2023-10-14 ENCOUNTER — Telehealth: Payer: Self-pay

## 2023-10-14 ENCOUNTER — Other Ambulatory Visit: Payer: Self-pay | Admitting: Hematology and Oncology

## 2023-10-14 ENCOUNTER — Other Ambulatory Visit (HOSPITAL_BASED_OUTPATIENT_CLINIC_OR_DEPARTMENT_OTHER): Payer: Self-pay

## 2023-10-14 MED ORDER — LORAZEPAM 0.5 MG PO TABS
0.5000 mg | ORAL_TABLET | Freq: Two times a day (BID) | ORAL | 0 refills | Status: DC | PRN
  Filled 2023-10-14 – 2023-10-19 (×3): qty 60, 30d supply, fill #0

## 2023-10-14 NOTE — Telephone Encounter (Signed)
 Called and given below message. He verbalized understanding and will call the office back for questions.

## 2023-10-14 NOTE — Telephone Encounter (Signed)
 I refilled lorazepam No we do not have ability to prescribe tablet/break the pills He just have to take it less freuqent and I will discuss dose reduction in his next visit

## 2023-10-14 NOTE — Telephone Encounter (Signed)
 He called and left a message requesting Ativan  refill to [facility]. He is still complaining of tingling sensation/ pain to legs and feet. He is taking the gabapentin  but it makes him so sleepy. He is asking if there is a tablet form of gabapentin  that he could break in half at times?

## 2023-10-15 ENCOUNTER — Other Ambulatory Visit (HOSPITAL_BASED_OUTPATIENT_CLINIC_OR_DEPARTMENT_OTHER): Payer: Self-pay

## 2023-10-18 ENCOUNTER — Other Ambulatory Visit (HOSPITAL_BASED_OUTPATIENT_CLINIC_OR_DEPARTMENT_OTHER): Payer: Self-pay

## 2023-10-19 ENCOUNTER — Telehealth: Payer: Self-pay

## 2023-10-19 ENCOUNTER — Other Ambulatory Visit: Payer: Self-pay | Admitting: Hematology and Oncology

## 2023-10-19 ENCOUNTER — Other Ambulatory Visit (HOSPITAL_BASED_OUTPATIENT_CLINIC_OR_DEPARTMENT_OTHER): Payer: Self-pay

## 2023-10-19 DIAGNOSIS — C760 Malignant neoplasm of head, face and neck: Secondary | ICD-10-CM

## 2023-10-19 MED ORDER — METHADONE HCL 10 MG PO TABS
10.0000 mg | ORAL_TABLET | Freq: Three times a day (TID) | ORAL | 0 refills | Status: DC
  Filled 2023-10-19: qty 90, 30d supply, fill #0

## 2023-10-19 NOTE — Telephone Encounter (Signed)
 Called and told him Rx sent. He verbalized understanding.

## 2023-10-19 NOTE — Telephone Encounter (Signed)
 done

## 2023-10-19 NOTE — Telephone Encounter (Signed)
 He called and left a message requesting methadone refill to [facility] please.

## 2023-10-21 MED FILL — Fosaprepitant Dimeglumine For IV Infusion 150 MG (Base Eq): INTRAVENOUS | Qty: 5 | Status: AC

## 2023-10-22 ENCOUNTER — Inpatient Hospital Stay: Payer: Medicaid Other | Attending: Hematology and Oncology | Admitting: Hematology and Oncology

## 2023-10-22 ENCOUNTER — Inpatient Hospital Stay: Payer: Medicaid Other

## 2023-10-22 ENCOUNTER — Encounter: Payer: Self-pay | Admitting: Hematology and Oncology

## 2023-10-22 ENCOUNTER — Other Ambulatory Visit (HOSPITAL_COMMUNITY): Payer: Self-pay

## 2023-10-22 ENCOUNTER — Inpatient Hospital Stay: Payer: Medicaid Other | Attending: Hematology and Oncology

## 2023-10-22 VITALS — BP 141/80 | HR 90 | Temp 99.3°F | Resp 18 | Ht 66.0 in | Wt 183.2 lb

## 2023-10-22 DIAGNOSIS — C119 Malignant neoplasm of nasopharynx, unspecified: Secondary | ICD-10-CM | POA: Diagnosis present

## 2023-10-22 DIAGNOSIS — Z79899 Other long term (current) drug therapy: Secondary | ICD-10-CM | POA: Diagnosis not present

## 2023-10-22 DIAGNOSIS — M542 Cervicalgia: Secondary | ICD-10-CM

## 2023-10-22 DIAGNOSIS — Z5111 Encounter for antineoplastic chemotherapy: Secondary | ICD-10-CM | POA: Insufficient documentation

## 2023-10-22 DIAGNOSIS — T451X5A Adverse effect of antineoplastic and immunosuppressive drugs, initial encounter: Secondary | ICD-10-CM | POA: Diagnosis not present

## 2023-10-22 DIAGNOSIS — C760 Malignant neoplasm of head, face and neck: Secondary | ICD-10-CM | POA: Diagnosis not present

## 2023-10-22 DIAGNOSIS — G620 Drug-induced polyneuropathy: Secondary | ICD-10-CM

## 2023-10-22 DIAGNOSIS — Z95828 Presence of other vascular implants and grafts: Secondary | ICD-10-CM

## 2023-10-22 DIAGNOSIS — Z7189 Other specified counseling: Secondary | ICD-10-CM

## 2023-10-22 DIAGNOSIS — C7800 Secondary malignant neoplasm of unspecified lung: Secondary | ICD-10-CM

## 2023-10-22 LAB — CMP (CANCER CENTER ONLY)
ALT: 21 U/L (ref 0–44)
AST: 30 U/L (ref 15–41)
Albumin: 4.4 g/dL (ref 3.5–5.0)
Alkaline Phosphatase: 69 U/L (ref 38–126)
Anion gap: 5 (ref 5–15)
BUN: 16 mg/dL (ref 6–20)
CO2: 28 mmol/L (ref 22–32)
Calcium: 9.3 mg/dL (ref 8.9–10.3)
Chloride: 104 mmol/L (ref 98–111)
Creatinine: 0.86 mg/dL (ref 0.61–1.24)
GFR, Estimated: >60 mL/min (ref >60)
Glucose, Bld: 202 mg/dL — ABNORMAL HIGH (ref 70–99)
Potassium: 4 mmol/L (ref 3.5–5.1)
Sodium: 137 mmol/L (ref 135–145)
Total Bilirubin: 0.3 mg/dL (ref 0.0–1.2)
Total Protein: 7.6 g/dL (ref 6.5–8.1)

## 2023-10-22 LAB — CBC WITH DIFFERENTIAL (CANCER CENTER ONLY)
Abs Immature Granulocytes: 0.02 10*3/uL (ref 0.00–0.07)
Basophils Absolute: 0 10*3/uL (ref 0.0–0.1)
Basophils Relative: 0 %
Eosinophils Absolute: 0 10*3/uL (ref 0.0–0.5)
Eosinophils Relative: 0 %
HCT: 34.6 % — ABNORMAL LOW (ref 39.0–52.0)
Hemoglobin: 10.8 g/dL — ABNORMAL LOW (ref 13.0–17.0)
Immature Granulocytes: 0 %
Lymphocytes Relative: 13 %
Lymphs Abs: 0.9 10*3/uL (ref 0.7–4.0)
MCH: 27.9 pg (ref 26.0–34.0)
MCHC: 31.2 g/dL (ref 30.0–36.0)
MCV: 89.4 fL (ref 80.0–100.0)
Monocytes Absolute: 0.1 10*3/uL (ref 0.1–1.0)
Monocytes Relative: 1 %
Neutro Abs: 5.4 10*3/uL (ref 1.7–7.7)
Neutrophils Relative %: 86 %
Platelet Count: 291 10*3/uL (ref 150–400)
RBC: 3.87 MIL/uL — ABNORMAL LOW (ref 4.22–5.81)
RDW: 16.1 % — ABNORMAL HIGH (ref 11.5–15.5)
WBC Count: 6.4 10*3/uL (ref 4.0–10.5)
nRBC: 0 % (ref 0.0–0.2)

## 2023-10-22 MED ORDER — CETIRIZINE HCL 10 MG/ML IV SOLN
10.0000 mg | Freq: Once | INTRAVENOUS | Status: AC
  Administered 2023-10-22: 10 mg via INTRAVENOUS
  Filled 2023-10-22: qty 1

## 2023-10-22 MED ORDER — SODIUM CHLORIDE 0.9 % IV SOLN
112.5000 mg/m2 | Freq: Once | INTRAVENOUS | Status: AC
  Administered 2023-10-22: 222 mg via INTRAVENOUS
  Filled 2023-10-22: qty 37

## 2023-10-22 MED ORDER — SODIUM CHLORIDE 0.9 % IV SOLN
728.0000 mg | Freq: Once | INTRAVENOUS | Status: AC
  Administered 2023-10-22: 730 mg via INTRAVENOUS
  Filled 2023-10-22: qty 73

## 2023-10-22 MED ORDER — SODIUM CHLORIDE 0.9 % IV SOLN
150.0000 mg | Freq: Once | INTRAVENOUS | Status: AC
  Administered 2023-10-22: 150 mg via INTRAVENOUS
  Filled 2023-10-22: qty 150

## 2023-10-22 MED ORDER — OXYCODONE HCL 15 MG PO TABS
15.0000 mg | ORAL_TABLET | ORAL | 0 refills | Status: DC | PRN
  Filled 2023-10-22: qty 90, 15d supply, fill #0

## 2023-10-22 MED ORDER — GABAPENTIN 100 MG PO CAPS
200.0000 mg | ORAL_CAPSULE | Freq: Two times a day (BID) | ORAL | 3 refills | Status: DC
  Filled 2023-10-22: qty 120, 30d supply, fill #0

## 2023-10-22 MED ORDER — PALONOSETRON HCL INJECTION 0.25 MG/5ML
0.2500 mg | Freq: Once | INTRAVENOUS | Status: AC
  Administered 2023-10-22: 0.25 mg via INTRAVENOUS
  Filled 2023-10-22: qty 5

## 2023-10-22 MED ORDER — SODIUM CHLORIDE 0.9% FLUSH
10.0000 mL | INTRAVENOUS | Status: DC | PRN
  Administered 2023-10-22: 10 mL

## 2023-10-22 MED ORDER — FAMOTIDINE IN NACL 20-0.9 MG/50ML-% IV SOLN
20.0000 mg | Freq: Once | INTRAVENOUS | Status: AC
  Administered 2023-10-22: 20 mg via INTRAVENOUS
  Filled 2023-10-22: qty 50

## 2023-10-22 MED ORDER — SODIUM CHLORIDE 0.9% FLUSH
10.0000 mL | Freq: Once | INTRAVENOUS | Status: AC
  Administered 2023-10-22: 10 mL

## 2023-10-22 MED ORDER — SODIUM CHLORIDE 0.9 % IV SOLN: INTRAVENOUS | Status: DC

## 2023-10-22 MED ORDER — DEXAMETHASONE SODIUM PHOSPHATE 10 MG/ML IJ SOLN
10.0000 mg | Freq: Once | INTRAMUSCULAR | Status: AC
  Administered 2023-10-22: 10 mg via INTRAVENOUS
  Filled 2023-10-22: qty 1

## 2023-10-22 NOTE — Assessment & Plan Note (Signed)
His pain control has improved I refilled his oxycodone prescription today I plan to reduce the dose of oxycodone in his next visit

## 2023-10-22 NOTE — Assessment & Plan Note (Signed)
As above, I plan to reduce the dose of chemo He does not tolerate high-dose gabapentin and I recommend dose reduction of gabapentin

## 2023-10-22 NOTE — Assessment & Plan Note (Signed)
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

## 2023-10-22 NOTE — Progress Notes (Signed)
[facility] OFFICE PROGRESS NOTE  Patient Care Team: Artis Delay, MD as PCP - General (Hematology and Oncology) Artis Delay, MD as Consulting Physician (Hematology and Oncology) Elfredia Nevins, MD as Referring Physician (Plastic Surgery) Hilarie Fredrickson, MD as Consulting Physician (Gastroenterology)  ASSESSMENT & PLAN:  Malignant neoplasm of head and neck [facility]) Recent CT imaging study was reviewed The disease that was noticeable on the right side of his neck had resolved He does not tolerate treatment well with neuropathy We discussed the role of cryotherapy and dose reduction for paclitaxel I recommend a few more cycles of treatment before stopping treatment  Neuropathy due to chemotherapeutic drug As above, I plan to reduce the dose of chemo He does not tolerate high-dose gabapentin and I recommend dose reduction of gabapentin  Neck pain on right side His pain control has improved I refilled his oxycodone prescription today I plan to reduce the dose of oxycodone in his next visit  No orders of the defined types were placed in this encounter.   All questions were answered. The patient knows to call the clinic with any problems, questions or concerns. The total time spent in the appointment was 40 minutes encounter with patients including review of chart and various tests results, discussions about plan of care and coordination of care plan   Artis Delay, MD 10/22/2023 11:09 AM  INTERVAL HISTORY: Please see below for problem oriented charting. he returns for chemo follow-up The patient developed neuropathy from recent treatment He went to the emergency department due to neck pain CT imaging is reviewed Since then, the mass on the right side of his neck has resolved His pain is much improved We discussed dose reduction and the role of cryotherapy to reduce risk of progressive neuropathy  REVIEW OF SYSTEMS:   Constitutional: Denies fevers, chills or abnormal  weight loss Eyes: Denies blurriness of vision Ears, nose, mouth, throat, and face: Denies mucositis or sore throat Respiratory: Denies cough, dyspnea or wheezes Cardiovascular: Denies palpitation, chest discomfort or lower extremity swelling Gastrointestinal:  Denies nausea, heartburn or change in bowel habits Skin: Denies abnormal skin rashes Lymphatics: Denies new lymphadenopathy or easy bruising Behavioral/Psych: Mood is stable, no new changes  All other systems were reviewed with the patient and are negative.  I have reviewed the past medical history, past surgical history, social history and family history with the patient and they are unchanged from previous note.  ALLERGIES:  is allergic to phenergan [promethazine hcl], heparin, and clindamycin.  MEDICATIONS:  Current Outpatient Medications  Medication Sig Dispense Refill   gabapentin (NEURONTIN) 100 MG capsule Take 2 capsules (200 mg total) by mouth 2 (two) times daily. 120 capsule 3   bisacodyl (DULCOLAX) 10 MG suppository Place 1 suppository (10 mg total) rectally as needed for moderate constipation. 4 suppository 0   blood glucose meter kit and supplies KIT Dispense based on patient and insurance preference. Use up to four times daily as directed. (FOR ICD-9 250.00, 250.01). 1 each 1   cetirizine (ZYRTEC) 10 MG tablet Take 1 tablet (10 mg total) by mouth daily as needed for allergies. 100 tablet 0   dexamethasone (DECADRON) 4 MG tablet Take 2 tablets by mouth the night before and 2 tablets the morning of chemotherapy, every 3 weeks x 6 cycles 24 tablet 6   famotidine (PEPCID) 40 MG tablet Take 1 tablet (40 mg total) by mouth at bedtime. 30 tablet 3   levothyroxine (SYNTHROID) 175 MCG tablet Take 1  tablet (175 mcg total) by mouth daily before breakfast. 30 tablet 3   LORazepam (ATIVAN) 0.5 MG tablet Take 1 tablet (0.5 mg total) by mouth 2 (two) times daily as needed for anxiety. 60 tablet 0   metFORMIN (GLUCOPHAGE) 500 MG tablet  Take 1 tablet (500 mg total) by mouth 2 (two) times daily with a meal. 60 tablet 2   methadone (DOLOPHINE) 10 MG tablet Take 1 tablet (10 mg) by mouth every 8 hours. 90 tablet 0   ondansetron (ZOFRAN) 8 MG tablet Take 1 tablet (8 mg total) by mouth every 8 (eight) hours as needed for nausea or vomiting. 90 tablet 1   oxyCODONE (ROXICODONE) 15 MG immediate release tablet Take 1 tablet (15 mg total) by mouth every 4 (four) hours as needed. 90 tablet 0   pantoprazole (PROTONIX) 40 MG tablet Take 1 tablet (40 mg total) by mouth daily. 30 tablet 2   polyethylene glycol (MIRALAX / GLYCOLAX) 17 g packet Take 17 g by mouth 2 (two) times daily. 30 each 3   prochlorperazine (COMPAZINE) 10 MG tablet Take 1 tablet (10 mg total) by mouth every 6 (six) hours as needed for nausea or vomiting. 30 tablet 2   senna-docusate (SENOKOT-S) 8.6-50 MG tablet Take 2 tablets by mouth 2 (two) times daily. 100 tablet 3   No current facility-administered medications for this visit.    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 Pan-endoscopy elsewhere showed cancer from nasopharyngeal space.   01/04/2007 - 02/20/2007 Chemotherapy   He received 2 cycles of cisplatin and 5FU followed by concurrent chemo with weekly cisplatin and radiation. He only received 2 doses of chemo due to severe mucositis, nausea and weight loss.   04/05/2007 - 08/04/2007  Chemotherapy   He received 4 more courses of cisplatin with 5FU and had complete response   07/05/2009 Procedure   Fine-needle aspirate of the right level II lymph nodes come from recurrent metastatic disease. Repeat endoscopy and CT scan show no evidence of disease elsewhere.   07/08/2009 - 12/02/2009 Chemotherapy   He was given 6 cycles of carboplatin, 5-FU and docetaxel   12/03/2009 Surgery   He has surgery to the residual lymph node on the right neck which showed no evidence of disease.   02/22/2012 Imaging   Repeat imaging study showed large recurrent mass. He was referred elsewhere for further treatment.   05/03/2012 Surgery   He underwent left upper lobectomy.   04/29/2013 Imaging   PEt scan showed lesion on right level II B and lower lung was abnormal   06/03/2013 - 02/02/2014 Chemotherapy   He had 6 cycles of chemotherapy when he was found to have recurrence of cancer and had received oxaliplatin and capecitabine   06/07/2014 Imaging   PET CT scan showed persistent disease in the right neck lymph nodes and left lung   06/29/2014 Procedure   Accession: [id number] repeat LUL biopsy confirmed  metastatic cancer   07/18/2014 - 07/31/2014 Radiation Therapy   He received palliative radiation therapy to the lungs   10/10/2014 Imaging   CT scan of the chest, abdomen and pelvis show regression in the size of the lung nodule in the left upper lobe and stable pulmonary nodules   01/24/2015 Imaging   CT scan showed stable disease in neck and lung   06/19/2015 Imaging   CT scan of the neck and the chest show possible mild progression of the nodule in the right side of the neck.   06/25/2015 Imaging   PET scan confirmed disease recurrence in the neck   07/07/2015 Imaging   He had [facility]   09/03/2015 - 08/26/2018 Chemotherapy   He received palliative chemo with Nivolumab   10/29/2015 Imaging   PET CT showed positive response to Rx   02/28/2016 Imaging   Ct abdomen showed abnormal  thinkening in his stomach   03/03/2016 Imaging   CT: Right sternocleidomastoid muscle metastasis appears less distinct but otherwise not significantly changed in size or configuration since 06/19/2015.2. Left level 3 lymph node which was hypermetabolic by PET-CT in January 2017 appears slightly smaller   04/01/2016 Imaging   CT cervical spine showed no acute fracture or traumatic malalignment in the cervical spine   04/22/2016 Procedure   Port-a-cath placed.   06/16/2016 Imaging   Ct neck showed right sternocleidomastoid muscle metastasis is further decreased in conspicuity since May, and has mildly decreased in size since September 2016. Continued stability of sub-centimeter left cervical lymph nodes. No new or progressive metastatic disease in the neck.   06/16/2016 Imaging   CT chest showed stable masslike radiation fibrosis in the left upper lobe. Stable subcentimeter pulmonary nodules in the bilateral lower lobes. No new or progressive metastatic disease in the chest. Nonobstructing left renal stone.   10/13/2016 Imaging   Ct neck showed unchanged right sternocleidomastoid muscle metastasis. Unchanged subcentimeter left cervical lymph nodes. No evidence of new or progressive metastatic disease in the neck.   10/13/2016 Imaging   CT chest showed tiny hypervascular foci in the liver, not definitely seen on prior imaging of 06/16/2016 and 02/28/2016. Abdomen MRI without and with contrast recommended to further evaluate as metastatic disease is a concern. 2. Stable appearance of post treatment changes left upper lung and scattered tiny bilateral pulmonary nodules.   02/11/2017 Imaging   Ct neck: Lymph node mass right posterior neck appears improved from the prior study. Small posterior lymph nodes on the left unchanged. Occluded right jugular vein unchanged.   02/11/2017 Imaging   1. Similar appearance of postsurgical and radiation changes in the left upper lobe. 2. Similar bilateral pulmonary  nodules. 3. No thoracic adenopathy. 4. Subtle foci of post-contrast enhancement within the liver are suboptimally characterized on this nondedicated study. Likely similar. These could either be re-evaluated at followup or more entirely characterized with abdominal MRI. 5. Left nephrolithiasis.   05/19/2017 Imaging   Matted lymph node mass right posterior neck appears larger in the recent CT. Accurate measurements difficult due to infiltrating tumor margins and infiltration of the muscle. Right jugular vein again appears occluded or resected. Small left posterior lymph nodes stable. Left upper lobe airspace density stable and similar to the prior CT   06/03/2017 PET scan   1. Hypermetabolic ill-defined right level IIb lymph node, about 1.3 cm in diameter with maximum SUV 9.5 (formerly 8.1). Appearance suspicious for residual/recurrent malignancy. No worrisome left-sided lesion. 2. Left suprahilar indistinct opacity  demonstrates no worrisome hypermetabolic activity. The 5 mm left lower lobe pulmonary nodule is stable and not currently hypermetabolic although below sensitive PET-CT size thresholds. 3. Other imaging findings of potential clinical significance: Bilateral nonobstructive nephrolithiasis. Chronic bilateral maxillary sinusitis.   05/11/2018 PET scan   1. Continued chronic accentuated metabolic activity in the vicinity of right level IIB and the adjacent right sternocleidomastoid muscle, with ill definition of surrounding tissue planes. Maximum SUV is currently 8.1, formerly 9.5. Accentuated metabolic activity is been present in this vicinity back through 06/25/2015, and there was also some low-level activity in this vicinity on 06/07/2014. Some of this may be from scarring and local muscular activity although clearly a component of residual tumor is difficult to exclude given the focally high activity. 2. Other imaging findings of potential clinical significance: Chronic bilateral maxillary  sinusitis. Chronic scarring in the left upper lobe. Chronically stable 5 mm left lower lobe nodule is considered benign. Nonobstructive left nephrolithiasis.   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 COMMENT:No significant keratinization is identified. Other basaloid carcinomas are in the differential diagnosis. No cell block material is available for further testing.   09/12/2018 Procedure   He underwent fine Needle Aspiration   10/04/2018 PET scan   1. Significant progression of local recurrence laterally in the mid right neck with an enlarging, increasingly hypermetabolic soft tissue mass. This involves the right sternocleidomastoid muscle. 2. Small lymph nodes in the right axilla are increasingly hypermetabolic. These are nonspecific and potentially reactive, although could reflect a small metastases. Small hypermetabolic nodule in the left suprasternal notch is unchanged. 3. No other evidence of metastatic disease.     10/07/2018 - 12/23/2018 Chemotherapy   The patient had cisplatin plus gemzar   12/07/2018 Imaging   1. Decreased size of lateral right neck mass. 2. Unchanged soft tissue nodule in the suprasternal notch. 3. No evidence of new metastatic disease in the neck.     05/30/2019 Imaging   CT neck No clear change or progression compared to the study of March. Overall measurements of the right lateral neck mass are similar, approximately 3 x 1.8 cm. See above discussion. One could argue that there is slight increase in lateral bulging, possibly with an increase in contrast enhancement, towards the inferior margin. This is of questionable validity but could possibly represent some progression or inflammatory change. Other findings in the region are stable.   07/20/2019 - 09/15/2019 Chemotherapy   The patient had gemzar and  cisplatin   10/02/2019 Imaging   CT neck As compared to 05/30/2019, no significant interval change in size of an ill-defined mass within the right lateral neck, again measuring 3.3 x 1.8 cm in transaxial dimensions.   Unchanged mildly enlarged left level I lymph node measuring 1.1 cm in short axis.   Unchanged node or nodule at the thoracic inlet, measuring 1.3 x 0.8 cm.   Please refer to concurrently performed chest CT for a description of findings below the level of the thoracic inlet.     10/02/2019 Imaging   CT chest 1. No new or progressive findings in the chest to suggest metastatic disease. 2. Bilateral subcentimeter solid pulmonary nodules are stable since 2018. 3. Hyperdense 1.1 cm anterior liver focus, not clearly visualized on prior studies. Suggest MRI abdomen without and with IV contrast for further characterization.   10/20/2019 - 12/29/2019  Chemotherapy   The patient had gemzar maintenance   06/12/2020 Imaging   1. Enlarging superficial, exophytic component of the chronic right sternocleidomastoid muscle mass. See series 6, image 55. 2. Elsewhere stable CT appearance of the Neck.   06/12/2020 Imaging   Post treatment scarring in the left hemithorax, stable. No evidence recurrent or metastatic disease   06/14/2020 - 10/15/2020 Chemotherapy   He received carboplatin, 5FU and Rande Lawman       10/31/2020 Procedure   Interval improvement in right lateral lymph node mass. Improvement in dermal component as well as invasion of the right sternocleidomastoid muscle.   10 mm submental lymph node slightly enlarged compared to the prior study. Continued follow-up recommended.   11/01/2020 - 05/22/2022 Chemotherapy   Patient is on Treatment Plan : HEAD/NECK Pembrolizumab Q21D     07/03/2022 - 10/30/2022 Chemotherapy   Patient is on Treatment Plan : Head and neck Pembrolizumab (400) q42d     11/30/2022 Imaging   CT chest  1. Stable exam. No new or progressive findings to suggest  recurrent or metastatic disease. 2. Stable tiny bilateral nodules since 06/12/2020, consistent with benign etiology. 3. Punctate nonobstructing left renal stone.   12/01/2022 Imaging   CT neck  Growing right neck mass since 2022 with epidural tumor extension via the right C2-3 foramen. Cervical MRI with contrast would be contributory.   12/11/2022 - 04/02/2023 Chemotherapy   Patient is on Treatment Plan : HEAD/NECK toripalimab-tpzi D1 + cisplatin D1 + gemcitabine D1,8 q21d x 6 cycles / toripalimab-tpzi q21d (up to 73m)     04/15/2023 Imaging   CT Chest W Contrast  Result Date: 04/15/2023 CLINICAL DATA:  Right neck mass, nasopharyngeal carcinoma EXAM: CT CHEST WITH CONTRAST TECHNIQUE: Multidetector CT imaging of the chest was performed during intravenous contrast administration. RADIATION DOSE REDUCTION: This exam was performed according to the departmental dose-optimization program which includes automated exposure control, adjustment of the mA and/or kV according to patient size and/or use of iterative reconstruction technique. CONTRAST:  75mL OMNIPAQUE IOHEXOL 300 MG/ML  SOLN COMPARISON:  11/27/2022 FINDINGS: Cardiovascular: Right Port-A-Cath tip: Right atrium. Mediastinum/Nodes: Small left supraclavicular lymph node 0.5 cm in short axis on image 4 series 3, formerly the same. No pathologic adenopathy in the chest. Lungs/Pleura: Biapical pleuroparenchymal scarring, right greater than left. Stable left upper lobe scarring and peribronchovascular density with wedge resection line in the left upper lobe, no significant contour change of density in this vicinity to suggest malignancy. Stable 5 by 4 mm left lower lobe nodule and stable 3 by 4 mm right lower lobe nodule, unchanged from at least 2021, compatible with benign etiology. No new lesion identified. Upper Abdomen: 2 mm left kidney upper pole nonobstructive renal calculus, image 43 series 7. Musculoskeletal: Unremarkable IMPRESSION: 1. Stable  postoperative findings in the left upper lobe. 2. Stable small bilateral lower lobe pulmonary nodules, unchanged from at least 2021, compatible with benign etiology. 3. 2 mm left kidney upper pole nonobstructive renal calculus. Electronically Signed   By: Gaylyn Rong M.D.   On: 04/15/2023 14:03   CT Soft Tissue Neck W Contrast  Result Date: 04/15/2023 CLINICAL DATA:  Head/neck cancer.  Last chemo 04/02/2023. EXAM: CT NECK WITH CONTRAST TECHNIQUE: Multidetector CT imaging of the neck was performed using the standard protocol following the bolus administration of intravenous contrast. RADIATION DOSE REDUCTION: This exam was performed according to the departmental dose-optimization program which includes automated exposure control, adjustment of the mA and/or kV according to patient size and/or  use of iterative reconstruction technique. CONTRAST:  75mL OMNIPAQUE IOHEXOL 300 MG/ML  SOLN COMPARISON:  CT neck 11/27/2022. FINDINGS: Pharynx and larynx: There is probable mucoid debris in the right nasal cavity/nasopharynx. The nasal cavity and nasopharynx are otherwise unremarkable. The oral cavity and oropharynx are unremarkable. The parapharyngeal spaces are clear. The hypopharynx and larynx are unremarkable. The epiglottis is normal. There is no retropharyngeal collection. The airway is patent. Salivary glands: Atrophy of the parotid and left submandibular glands is unchanged. The right submandibular gland is again not identified and may be surgically absent. Thyroid: Unremarkable. Lymph nodes: The infiltrative mass in the right neck is again seen. The mass invades through the right C2-C3 neural foramen into the epidural space resulting in mild narrowing of the thecal sac without suspected cord compression. The degree of intraspinal epidural tumor appears slightly increased compared to the study from 11/27/2022. Mass again encases the vertebral artery at the C2-C3 level (4-36), unchanged. The remainder of the  infiltrative mass in the paraspinal musculature with extension into the overlying skin surface does not appear significantly changed in size or extent compared to the prior study. A 0.9 cm midline level I lymph node is unchanged going back to 2021. Otherwise, there is no new or progressive lymphadenopathy in the neck. Vascular: As above, there is encasement of the right vertebral artery at the C2-C3 level by the right neck mass. There is mild calcified plaque at the carotid bifurcations. The right IJ is occluded or sacrificed, unchanged. The left IJ is patent. A right chest wall port is partially imaged. Limited intracranial: Unremarkable. Visualized orbits: Unremarkable. Mastoids and visualized paranasal sinuses: There is mild mucosal thickening in the maxillary sinuses. The mastoid air cells and middle ear cavities are clear. Skeleton: There is no acute osseous abnormality or suspicious osseous lesion. Upper chest: Assessed on the separately dictated CT chest. Other: None. IMPRESSION: 1. Infiltrative mass again seen in the right neck centered in the paraspinal musculature with invasion through the right C2-C3 neural foramen. The bulk of the epidural tumor at this level appears slightly increased in size compared to the prior study from 11/27/2022 with probable leftward displacement of the cord but without frank cord compression. The remainder of the mass in the right neck is otherwise not significantly changed. 2. No new or progressive lymphadenopathy in the remainder of the neck. 3. Probable mucoid debris in the right nasal cavity/nasopharynx. No suspicious mass lesion or enhancement in the nasopharynx. Electronically Signed   By: Lesia Hausen M.D.   On: 04/15/2023 14:02      05/07/2023 - 09/10/2023 Chemotherapy   Patient is on Treatment Plan : HEAD/NECK Cetuximab q14d + Carboplatin + 5FU IVCI D1-4 q21d x 6 cycles / Cetuximab q14d     10/01/2023 -  Chemotherapy   Patient is on Treatment Plan : HEAD/NECK  Carboplatin + Paclitaxel q21d     10/04/2023 Imaging   CT Soft Tissue Neck W Contrast Result Date: 10/04/2023 CLINICAL DATA:  Neck mass, nonpulsatile neck swelling EXAM: CT NECK WITH CONTRAST TECHNIQUE: Multidetector CT imaging of the neck was performed using the standard protocol following the bolus administration of intravenous contrast. RADIATION DOSE REDUCTION: This exam was performed according to the departmental dose-optimization program which includes automated exposure control, adjustment of the mA and/or kV according to patient size and/or use of iterative reconstruction technique. CONTRAST:  75mL OMNIPAQUE IOHEXOL 300 MG/ML  SOLN COMPARISON:  CT neck 04/13/2023. FINDINGS: Pharynx and larynx: Unremarkable nasopharynx. Salivary glands: Atrophic.  No  evidence of acute abnormality. Thyroid: Normal. Lymph nodes: The infiltrative mass in the right neck is slightly decreased in size/bulk, now measuring approximately 3.7 x 2.8 cm. 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 The tumor extends superficially to the skin surface, similar to prior. Mildly increased central areas of necrosis/cystic change within the mass. Stable midline 9 mm level I lymph node. No new or progressive lymphadenopathy. 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 Calcific atherosclerosis. Limited intracranial: No obvious acute abnormality on limited assessment and partial visualization. Visualized orbits: Negative. Mastoids and visualized paranasal sinuses: Clear. Skeleton: No acute or aggressive process. Upper chest: Chronic postoperative changes in the left upper lobe, better characterized on prior CT of the chest. IMPRESSION: 1. 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 2. No new or progressive lymphadenopathy. Electronically Signed   By:  Feliberto Harts M.D.   On: 10/04/2023 17:15        PHYSICAL EXAMINATION: ECOG PERFORMANCE STATUS: 1 - Symptomatic but completely ambulatory  Vitals:   10/22/23 1058  BP: (!) 141/80  Pulse: 90  Resp: 18  Temp: 99.3 F (37.4 C)  SpO2: 100%   Filed Weights   10/22/23 1058  Weight: 183 lb 3.2 oz (83.1 kg)    GENERAL:alert, no distress and comfortable SKIN: skin color, texture, turgor are normal, no rashes or significant lesions EYES: normal, Conjunctiva are pink and non-injected, sclera clear OROPHARYNX:no exudate, no erythema and lips, buccal mucosa, and tongue normal  NECK: Previously noted cancer on the right side of his neck has resolved NEURO: alert & oriented x 3 with fluent speech, no focal motor/sensory deficits  LABORATORY DATA:  I have reviewed the data as listed    Component Value Date/Time   NA 136 10/04/2023 1554   NA 139 09/22/2017 0829   K 3.5 10/04/2023 1554   K 3.5 09/22/2017 0829   CL 99 10/04/2023 1554   CO2 27 10/04/2023 1554   CO2 26 09/22/2017 0829   GLUCOSE 113 (H) 10/04/2023 1554   GLUCOSE 133 09/22/2017 0829   BUN 15 10/04/2023 1554   BUN 14.1 09/22/2017 0829   CREATININE 0.52 (L) 10/04/2023 1554   CREATININE 0.78 10/01/2023 1041   CREATININE 0.9 09/22/2017 0829   CALCIUM 8.9 10/04/2023 1554   CALCIUM 9.1 09/22/2017 0829   PROT 6.8 10/04/2023 1554   PROT 6.8 09/22/2017 0829   ALBUMIN 4.3 10/04/2023 1554   ALBUMIN 4.1 09/22/2017 0829   AST 22 10/04/2023 1554   AST 22 10/01/2023 1041   AST 22 09/22/2017 0829   ALT 15 10/04/2023 1554   ALT 15 10/01/2023 1041   ALT 30 09/22/2017 0829   ALKPHOS 59 10/04/2023 1554   ALKPHOS 55 09/22/2017 0829   BILITOT 0.5 10/04/2023 1554   BILITOT 0.3 10/01/2023 1041   BILITOT 0.35 09/22/2017 0829   GFRNONAA >60 10/04/2023 1554   GFRNONAA >60 10/01/2023 1041   GFRAA >60 07/05/2020 0845   GFRAA >60 02/06/2019 1215    No results found for: "SPEP", "UPEP"  Lab Results  Component Value Date    WBC 6.4 10/22/2023   NEUTROABS 5.4 10/22/2023   HGB 10.8 (L) 10/22/2023   HCT 34.6 (L) 10/22/2023   MCV 89.4 10/22/2023   PLT 291 10/22/2023      Chemistry      Component Value Date/Time  NA 136 10/04/2023 1554   NA 139 09/22/2017 0829   K 3.5 10/04/2023 1554   K 3.5 09/22/2017 0829   CL 99 10/04/2023 1554   CO2 27 10/04/2023 1554   CO2 26 09/22/2017 0829   BUN 15 10/04/2023 1554   BUN 14.1 09/22/2017 0829   CREATININE 0.52 (L) 10/04/2023 1554   CREATININE 0.78 10/01/2023 1041   CREATININE 0.9 09/22/2017 0829      Component Value Date/Time   CALCIUM 8.9 10/04/2023 1554   CALCIUM 9.1 09/22/2017 0829   ALKPHOS 59 10/04/2023 1554   ALKPHOS 55 09/22/2017 0829   AST 22 10/04/2023 1554   AST 22 10/01/2023 1041   AST 22 09/22/2017 0829   ALT 15 10/04/2023 1554   ALT 15 10/01/2023 1041   ALT 30 09/22/2017 0829   BILITOT 0.5 10/04/2023 1554   BILITOT 0.3 10/01/2023 1041   BILITOT 0.35 09/22/2017 0829       RADIOGRAPHIC STUDIES: I have personally reviewed the radiological images as listed and agreed with the findings in the report. CT Soft Tissue Neck W Contrast Result Date: 10/04/2023 CLINICAL DATA:  Neck mass, nonpulsatile neck swelling EXAM: CT NECK WITH CONTRAST TECHNIQUE: Multidetector CT imaging of the neck was performed using the standard protocol following the bolus administration of intravenous contrast. RADIATION DOSE REDUCTION: This exam was performed according to the departmental dose-optimization program which includes automated exposure control, adjustment of the mA and/or kV according to patient size and/or use of iterative reconstruction technique. CONTRAST:  75mL OMNIPAQUE IOHEXOL 300 MG/ML  SOLN COMPARISON:  CT neck 04/13/2023. FINDINGS: Pharynx and larynx: Unremarkable nasopharynx. Salivary glands: Atrophic.  No evidence of acute abnormality. Thyroid: Normal. 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 The tumor extends superficially to the skin surface, similar to prior. Mildly increased central areas of necrosis/cystic change within the mass. Stable midline 9 mm level I lymph node. No new or progressive lymphadenopathy. 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 Calcific atherosclerosis. Limited intracranial: No obvious acute abnormality on limited assessment and partial visualization. Visualized orbits: Negative. Mastoids and visualized paranasal sinuses: Clear. Skeleton: No acute or aggressive process. Upper chest: Chronic postoperative changes in the left upper lobe, better characterized on prior CT of the chest. IMPRESSION: 1. 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 2. No new or progressive lymphadenopathy. Electronically Signed   By: Feliberto Harts M.D.   On: 10/04/2023 17:15

## 2023-10-22 NOTE — Patient Instructions (Signed)
 [facility] - A DEPT OF [facility]  Discharge Instructions: Thank you for choosing [facility] to provide your oncology and hematology care.   If you have a lab appointment with the Cancer Center, please go directly to the Cancer Center and check in at the registration area.   Wear comfortable clothing and clothing appropriate for easy access to any Portacath or PICC line.   We strive to give you quality time with your provider. You may need to reschedule your appointment if you arrive late (15 or more minutes).  Arriving late affects you and other patients whose appointments are after yours.  Also, if you miss three or more appointments without notifying the office, you may be dismissed from the clinic at the provider's discretion.      For prescription refill requests, have your pharmacy contact our office and allow 72 hours for refills to be completed.    Today you received the following chemotherapy and/or immunotherapy agents: Taxol, Carboplatin      To help prevent nausea and vomiting after your treatment, we encourage you to take your nausea medication as directed.  BELOW ARE SYMPTOMS THAT SHOULD BE REPORTED IMMEDIATELY: *FEVER GREATER THAN 100.4 F (38 C) OR HIGHER *CHILLS OR SWEATING *NAUSEA AND VOMITING THAT IS NOT CONTROLLED WITH YOUR NAUSEA MEDICATION *UNUSUAL SHORTNESS OF BREATH *UNUSUAL BRUISING OR BLEEDING *URINARY PROBLEMS (pain or burning when urinating, or frequent urination) *BOWEL PROBLEMS (unusual diarrhea, constipation, pain near the anus) TENDERNESS IN MOUTH AND THROAT WITH OR WITHOUT PRESENCE OF ULCERS (sore throat, sores in mouth, or a toothache) UNUSUAL RASH, SWELLING OR PAIN  UNUSUAL VAGINAL DISCHARGE OR ITCHING   Items with * indicate a potential emergency and should be followed up as soon as possible or go to the Emergency Department if any problems should occur.  Please show the CHEMOTHERAPY ALERT CARD or  IMMUNOTHERAPY ALERT CARD at check-in to the Emergency Department and triage nurse.  Should you have questions after your visit or need to cancel or reschedule your appointment, please contact [facility] - A DEPT OF [facility]  Dept: [phone]  and follow the prompts.  Office hours are 8:00 a.m. to 4:30 p.m. Monday - Friday. Please note that voicemails left after 4:00 p.m. may not be returned until the following business day.  We are closed weekends and major holidays. You have access to a nurse at all times for urgent questions. Please call the main number to the clinic Dept: [phone] and follow the prompts.   For any non-urgent questions, you may also contact your provider using MyChart. We now offer e-Visits for anyone 54 and older to request care online for non-urgent symptoms. For details visit [email or web address].   Also download the MyChart app! Go to the app store, search "MyChart", open the app, select [facility], and log in with your MyChart username and password.

## 2023-10-24 ENCOUNTER — Other Ambulatory Visit: Payer: Self-pay

## 2023-10-26 ENCOUNTER — Telehealth: Payer: Self-pay

## 2023-10-26 ENCOUNTER — Other Ambulatory Visit (HOSPITAL_BASED_OUTPATIENT_CLINIC_OR_DEPARTMENT_OTHER): Payer: Self-pay

## 2023-10-26 ENCOUNTER — Other Ambulatory Visit: Payer: Self-pay

## 2023-10-26 MED ORDER — PANTOPRAZOLE SODIUM 40 MG PO TBEC
40.0000 mg | DELAYED_RELEASE_TABLET | Freq: Every day | ORAL | 2 refills | Status: DC
  Filled 2023-10-26: qty 30, 30d supply, fill #0
  Filled 2024-01-28 (×5): qty 30, 30d supply, fill #1

## 2023-10-26 NOTE — Telephone Encounter (Signed)
Called him back. He has not looked at his feet today and is in the bed when I called. He will call the office back tomorrow after looking at his feet regarding possible referral to podiatrist.  On Saturday he started having the neck stiffness again. It is starting to get some better today. 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 He is taking gabapentin as instructed.

## 2023-10-26 NOTE — Telephone Encounter (Signed)
Called and left a message asking him to call the office back.

## 2023-10-26 NOTE — Telephone Encounter (Signed)
-----   Message from Christian Simmons sent at 10/26/2023  9:53 AM EST ----- he was noted to have ingrown toe nail Is he taking care of it or does he need referral to see podiatrist?

## 2023-11-08 ENCOUNTER — Telehealth: Payer: Self-pay

## 2023-11-08 ENCOUNTER — Other Ambulatory Visit (HOSPITAL_BASED_OUTPATIENT_CLINIC_OR_DEPARTMENT_OTHER): Payer: Self-pay

## 2023-11-08 ENCOUNTER — Other Ambulatory Visit: Payer: Self-pay | Admitting: Hematology and Oncology

## 2023-11-08 MED ORDER — OXYCODONE HCL 10 MG PO TABS
10.0000 mg | ORAL_TABLET | ORAL | 0 refills | Status: DC | PRN
  Filled 2023-11-08: qty 90, 15d supply, fill #0

## 2023-11-08 NOTE — Telephone Encounter (Signed)
Refill sent with reduced dose of 10 mg\

## 2023-11-08 NOTE — Telephone Encounter (Signed)
He called and left a message requesting Oxycodone refill to pharmacy. He is asking for a oxycodone to be reduced to 10 mg please.

## 2023-11-08 NOTE — Telephone Encounter (Signed)
Called back and told Rx sent with reduced dose. He verbalized understanding. He is complaining of a lot of anxiety still and taking Ativan Rx. Offered to send social worker referral and he declined. He will call the office back of referral needed and for questions.

## 2023-11-09 ENCOUNTER — Other Ambulatory Visit: Payer: Self-pay

## 2023-11-12 ENCOUNTER — Ambulatory Visit: Payer: Medicaid Other

## 2023-11-12 ENCOUNTER — Telehealth: Payer: Self-pay

## 2023-11-12 ENCOUNTER — Inpatient Hospital Stay: Payer: Medicaid Other

## 2023-11-12 ENCOUNTER — Encounter: Payer: Self-pay | Admitting: Hematology and Oncology

## 2023-11-12 ENCOUNTER — Other Ambulatory Visit: Payer: Medicaid Other

## 2023-11-12 ENCOUNTER — Inpatient Hospital Stay: Payer: Medicaid Other | Attending: Hematology and Oncology

## 2023-11-12 ENCOUNTER — Ambulatory Visit: Payer: Medicaid Other | Admitting: Hematology and Oncology

## 2023-11-12 ENCOUNTER — Inpatient Hospital Stay: Payer: Medicaid Other | Admitting: Hematology and Oncology

## 2023-11-12 VITALS — BP 147/90 | HR 93 | Temp 98.3°F | Resp 19 | Ht 66.0 in | Wt 179.6 lb

## 2023-11-12 DIAGNOSIS — E039 Hypothyroidism, unspecified: Secondary | ICD-10-CM

## 2023-11-12 DIAGNOSIS — C7800 Secondary malignant neoplasm of unspecified lung: Secondary | ICD-10-CM

## 2023-11-12 DIAGNOSIS — G893 Neoplasm related pain (acute) (chronic): Secondary | ICD-10-CM

## 2023-11-12 DIAGNOSIS — Z7189 Other specified counseling: Secondary | ICD-10-CM

## 2023-11-12 DIAGNOSIS — Z79633 Long term (current) use of mitotic inhibitor: Secondary | ICD-10-CM | POA: Insufficient documentation

## 2023-11-12 DIAGNOSIS — Z79630 Long term (current) use of alkylating agent: Secondary | ICD-10-CM | POA: Diagnosis not present

## 2023-11-12 DIAGNOSIS — C760 Malignant neoplasm of head, face and neck: Secondary | ICD-10-CM

## 2023-11-12 DIAGNOSIS — G620 Drug-induced polyneuropathy: Secondary | ICD-10-CM

## 2023-11-12 DIAGNOSIS — Z5111 Encounter for antineoplastic chemotherapy: Secondary | ICD-10-CM | POA: Diagnosis present

## 2023-11-12 DIAGNOSIS — C119 Malignant neoplasm of nasopharynx, unspecified: Secondary | ICD-10-CM | POA: Diagnosis present

## 2023-11-12 DIAGNOSIS — Z95828 Presence of other vascular implants and grafts: Secondary | ICD-10-CM

## 2023-11-12 DIAGNOSIS — T451X5A Adverse effect of antineoplastic and immunosuppressive drugs, initial encounter: Secondary | ICD-10-CM

## 2023-11-12 LAB — CBC WITH DIFFERENTIAL (CANCER CENTER ONLY)
Abs Immature Granulocytes: 0.01 10*3/uL (ref 0.00–0.07)
Basophils Absolute: 0 10*3/uL (ref 0.0–0.1)
Basophils Relative: 0 %
Eosinophils Absolute: 0 10*3/uL (ref 0.0–0.5)
Eosinophils Relative: 0 %
HCT: 34.2 % — ABNORMAL LOW (ref 39.0–52.0)
Hemoglobin: 11 g/dL — ABNORMAL LOW (ref 13.0–17.0)
Immature Granulocytes: 0 %
Lymphocytes Relative: 11 %
Lymphs Abs: 0.4 10*3/uL — ABNORMAL LOW (ref 0.7–4.0)
MCH: 28.2 pg (ref 26.0–34.0)
MCHC: 32.2 g/dL (ref 30.0–36.0)
MCV: 87.7 fL (ref 80.0–100.0)
Monocytes Absolute: 0 10*3/uL — ABNORMAL LOW (ref 0.1–1.0)
Monocytes Relative: 1 %
Neutro Abs: 3.1 10*3/uL (ref 1.7–7.7)
Neutrophils Relative %: 88 %
Platelet Count: 212 10*3/uL (ref 150–400)
RBC: 3.9 MIL/uL — ABNORMAL LOW (ref 4.22–5.81)
RDW: 16.6 % — ABNORMAL HIGH (ref 11.5–15.5)
WBC Count: 3.5 10*3/uL — ABNORMAL LOW (ref 4.0–10.5)
nRBC: 0 % (ref 0.0–0.2)

## 2023-11-12 LAB — CMP (CANCER CENTER ONLY)
ALT: 13 U/L (ref 0–44)
AST: 19 U/L (ref 15–41)
Albumin: 4.6 g/dL (ref 3.5–5.0)
Alkaline Phosphatase: 69 U/L (ref 38–126)
Anion gap: 6 (ref 5–15)
BUN: 13 mg/dL (ref 6–20)
CO2: 29 mmol/L (ref 22–32)
Calcium: 9.2 mg/dL (ref 8.9–10.3)
Chloride: 103 mmol/L (ref 98–111)
Creatinine: 0.85 mg/dL (ref 0.61–1.24)
GFR, Estimated: >60 mL/min (ref >60)
Glucose, Bld: 227 mg/dL — ABNORMAL HIGH (ref 70–99)
Potassium: 3.9 mmol/L (ref 3.5–5.1)
Sodium: 138 mmol/L (ref 135–145)
Total Bilirubin: 0.3 mg/dL (ref 0.0–1.2)
Total Protein: 7.5 g/dL (ref 6.5–8.1)

## 2023-11-12 MED ORDER — CETIRIZINE HCL 10 MG/ML IV SOLN
10.0000 mg | Freq: Once | INTRAVENOUS | Status: AC
  Administered 2023-11-12: 10 mg via INTRAVENOUS
  Filled 2023-11-12: qty 1

## 2023-11-12 MED ORDER — SODIUM CHLORIDE 0.9 % IV SOLN
112.5000 mg/m2 | Freq: Once | INTRAVENOUS | Status: AC
  Administered 2023-11-12: 222 mg via INTRAVENOUS
  Filled 2023-11-12: qty 37

## 2023-11-12 MED ORDER — PALONOSETRON HCL INJECTION 0.25 MG/5ML
0.2500 mg | Freq: Once | INTRAVENOUS | Status: AC
  Administered 2023-11-12: 0.25 mg via INTRAVENOUS
  Filled 2023-11-12: qty 5

## 2023-11-12 MED ORDER — SODIUM CHLORIDE 0.9 % IV SOLN: INTRAVENOUS | Status: DC

## 2023-11-12 MED ORDER — SODIUM CHLORIDE 0.9 % IV SOLN
708.0000 mg | Freq: Once | INTRAVENOUS | Status: AC
  Administered 2023-11-12: 710 mg via INTRAVENOUS
  Filled 2023-11-12: qty 71

## 2023-11-12 MED ORDER — DEXAMETHASONE SODIUM PHOSPHATE 10 MG/ML IJ SOLN
10.0000 mg | Freq: Once | INTRAMUSCULAR | Status: AC
  Administered 2023-11-12: 10 mg via INTRAVENOUS
  Filled 2023-11-12: qty 1

## 2023-11-12 MED ORDER — FOSAPREPITANT DIMEGLUMINE INJECTION 150 MG
150.0000 mg | Freq: Once | INTRAVENOUS | Status: AC
  Administered 2023-11-12: 150 mg via INTRAVENOUS
  Filled 2023-11-12: qty 150

## 2023-11-12 MED ORDER — SODIUM CHLORIDE 0.9% FLUSH
10.0000 mL | Freq: Once | INTRAVENOUS | Status: AC
  Administered 2023-11-12: 10 mL

## 2023-11-12 MED ORDER — SODIUM CHLORIDE 0.9% FLUSH: 10.0000 mL | INTRAVENOUS | Status: DC | PRN

## 2023-11-12 MED ORDER — FAMOTIDINE IN NACL 20-0.9 MG/50ML-% IV SOLN
20.0000 mg | Freq: Once | INTRAVENOUS | Status: AC
  Administered 2023-11-12: 20 mg via INTRAVENOUS
  Filled 2023-11-12: qty 50

## 2023-11-12 NOTE — Assessment & Plan Note (Signed)
 He has amazing response The tumor is not visible Overall, he has complete response with current treatment The plan will be to prescribe minimum 4 cycles before repeating imaging study

## 2023-11-12 NOTE — Telephone Encounter (Signed)
 Called him regarding earlier question. He feels that he is more anxious the last couple of weeks and thinks maybe it is from pain medication reduction.  Told him Dr. Lonn does not think it is from pain medication reduction. He has chronic anxiety and she advices him to take lorazepam  as needed. He verbalized understanding and when cannot sleep in the middle of the night he will try OTC benadryl . He will call the office back for questions/ concerns.

## 2023-11-12 NOTE — Assessment & Plan Note (Signed)
 He will continue with reduced dose of paclitaxel along with cryotherapy

## 2023-11-12 NOTE — Patient Instructions (Signed)
 [facility] - A DEPT OF [facility]  Discharge Instructions: Thank you for choosing [facility] to provide your oncology and hematology care.   If you have a lab appointment with the Cancer Center, please go directly to the Cancer Center and check in at the registration area.   Wear comfortable clothing and clothing appropriate for easy access to any Portacath or PICC line.   We strive to give you quality time with your provider. You may need to reschedule your appointment if you arrive late (15 or more minutes).  Arriving late affects you and other patients whose appointments are after yours.  Also, if you miss three or more appointments without notifying the office, you may be dismissed from the clinic at the provider's discretion.      For prescription refill requests, have your pharmacy contact our office and allow 72 hours for refills to be completed.    Today you received the following chemotherapy and/or immunotherapy agents Taxol, Carboplatin      To help prevent nausea and vomiting after your treatment, we encourage you to take your nausea medication as directed.  BELOW ARE SYMPTOMS THAT SHOULD BE REPORTED IMMEDIATELY: *FEVER GREATER THAN 100.4 F (38 C) OR HIGHER *CHILLS OR SWEATING *NAUSEA AND VOMITING THAT IS NOT CONTROLLED WITH YOUR NAUSEA MEDICATION *UNUSUAL SHORTNESS OF BREATH *UNUSUAL BRUISING OR BLEEDING *URINARY PROBLEMS (pain or burning when urinating, or frequent urination) *BOWEL PROBLEMS (unusual diarrhea, constipation, pain near the anus) TENDERNESS IN MOUTH AND THROAT WITH OR WITHOUT PRESENCE OF ULCERS (sore throat, sores in mouth, or a toothache) UNUSUAL RASH, SWELLING OR PAIN  UNUSUAL VAGINAL DISCHARGE OR ITCHING   Items with * indicate a potential emergency and should be followed up as soon as possible or go to the Emergency Department if any problems should occur.  Please show the CHEMOTHERAPY ALERT CARD or  IMMUNOTHERAPY ALERT CARD at check-in to the Emergency Department and triage nurse.  Should you have questions after your visit or need to cancel or reschedule your appointment, please contact [facility] - A DEPT OF [facility]  Dept: [phone]  and follow the prompts.  Office hours are 8:00 a.m. to 4:30 p.m. Monday - Friday. Please note that voicemails left after 4:00 p.m. may not be returned until the following business day.  We are closed weekends and major holidays. You have access to a nurse at all times for urgent questions. Please call the main number to the clinic Dept: [phone] and follow the prompts.   For any non-urgent questions, you may also contact your provider using MyChart. We now offer e-Visits for anyone 64 and older to request care online for non-urgent symptoms. For details visit [email or web address].   Also download the MyChart app! Go to the app store, search "MyChart", open the app, select [facility], and log in with your MyChart username and password.

## 2023-11-12 NOTE — Progress Notes (Signed)
 [facility] OFFICE PROGRESS NOTE  Patient Care Team: Lonn Hicks, MD as PCP - General (Hematology and Oncology) Lonn Hicks, MD as Consulting Physician (Hematology and Oncology) Graig Lynwood BIRCH, MD as Referring Physician (Plastic Surgery) Abran Norleen SAILOR, MD as Consulting Physician (Gastroenterology)  ASSESSMENT & PLAN:  Malignant neoplasm of head and neck [facility]) He has amazing response The tumor is not visible Overall, he has complete response with current treatment The plan will be to prescribe minimum 4 cycles before repeating imaging study  Neuropathy due to chemotherapeutic drug He will continue with reduced dose of paclitaxel  along with cryotherapy  Cancer associated pain His pain control is stable He is okay for me to reduce the dose of oxycodone   Orders Placed This Encounter  Procedures   T4, free    Standing Status:   Standing    Number of Occurrences:   22    Expiration Date:   11/11/2024    All questions were answered. The patient knows to call the clinic with any problems, questions or concerns. The total time spent in the appointment was 30 minutes encounter with patients including review of chart and various tests results, discussions about plan of care and coordination of care plan   Hicks Lonn, MD 11/12/2023 11:58 AM  INTERVAL HISTORY: Please see below for problem oriented charting. he returns for chemo follow-up He is feeling better The mass is no longer visible or palpable He is able to reduce the doses of oxycodone  His neuropathy is worse in the feet than the hands He has frequent dehydration with treatment and requested IV fluids tomorrow  REVIEW OF SYSTEMS:   Constitutional: Denies fevers, chills or abnormal weight loss Eyes: Denies blurriness of vision Ears, nose, mouth, throat, and face: Denies mucositis or sore throat Respiratory: Denies cough, dyspnea or wheezes Cardiovascular: Denies palpitation, chest discomfort or lower extremity  swelling Gastrointestinal:  Denies nausea, heartburn or change in bowel habits Skin: Denies abnormal skin rashes Lymphatics: Denies new lymphadenopathy or easy bruising Behavioral/Psych: Mood is stable, no new changes  All other systems were reviewed with the patient and are negative.  I have reviewed the past medical history, past surgical history, social history and family history with the patient and they are unchanged from previous note.  ALLERGIES:  is allergic to phenergan  [promethazine  hcl], heparin , and clindamycin.  MEDICATIONS:  Current Outpatient Medications  Medication Sig Dispense Refill   tiZANidine  (ZANAFLEX ) 2 MG tablet Take 2 mg by mouth every 6 (six) hours as needed for muscle spasms.     bisacodyl  (DULCOLAX) 10 MG suppository Place 1 suppository (10 mg total) rectally as needed for moderate constipation. 4 suppository 0   blood glucose meter kit and supplies KIT Dispense based on patient and insurance preference. Use up to four times daily as directed. (FOR ICD-9 250.00, 250.01). 1 each 1   cetirizine  (ZYRTEC ) 10 MG tablet Take 1 tablet (10 mg total) by mouth daily as needed for allergies. 100 tablet 0   dexamethasone  (DECADRON ) 4 MG tablet Take 2 tablets by mouth the night before and 2 tablets the morning of chemotherapy, every 3 weeks x 6 cycles 24 tablet 6   famotidine  (PEPCID ) 40 MG tablet Take 1 tablet (40 mg total) by mouth at bedtime. 30 tablet 3   gabapentin  (NEURONTIN ) 100 MG capsule Take 2 capsules (200 mg total) by mouth 2 (two) times daily. 120 capsule 3   levothyroxine  (SYNTHROID ) 175 MCG tablet Take 1 tablet (175 mcg total) by  mouth daily before breakfast. 30 tablet 3   LORazepam  (ATIVAN ) 0.5 MG tablet Take 1 tablet (0.5 mg total) by mouth 2 (two) times daily as needed for anxiety. 60 tablet 0   metFORMIN  (GLUCOPHAGE ) 500 MG tablet Take 1 tablet (500 mg total) by mouth 2 (two) times daily with a meal. 60 tablet 2   methadone  (DOLOPHINE ) 10 MG tablet Take 1  tablet (10 mg) by mouth every 8 hours. 90 tablet 0   ondansetron  (ZOFRAN ) 8 MG tablet Take 1 tablet (8 mg total) by mouth every 8 (eight) hours as needed for nausea or vomiting. 90 tablet 1   Oxycodone  HCl 10 MG TABS Take 1 tablet (10 mg total) by mouth every 4 (four) hours as needed. 90 tablet 0   pantoprazole  (PROTONIX ) 40 MG tablet Take 1 tablet (40 mg total) by mouth daily. 30 tablet 2   polyethylene glycol (MIRALAX  / GLYCOLAX ) 17 g packet Take 17 g by mouth 2 (two) times daily. 30 each 3   prochlorperazine  (COMPAZINE ) 10 MG tablet Take 1 tablet (10 mg total) by mouth every 6 (six) hours as needed for nausea or vomiting. 30 tablet 2   senna-docusate (SENOKOT-S) 8.6-50 MG tablet Take 2 tablets by mouth 2 (two) times daily. 100 tablet 3   No current facility-administered medications for this visit.    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 Pan-endoscopy elsewhere showed cancer from nasopharyngeal space.   01/04/2007 - 02/20/2007 Chemotherapy   He received 2 cycles of cisplatin  and 5FU followed by concurrent chemo with weekly cisplatin  and radiation. He only received 2 doses of chemo due to severe mucositis, nausea and weight loss.   04/05/2007 - 08/04/2007 Chemotherapy   He received 4 more courses of cisplatin  with 5FU and had complete response   07/05/2009 Procedure   Fine-needle aspirate of the right level II lymph  nodes come from recurrent metastatic disease. Repeat endoscopy and CT scan show no evidence of disease elsewhere.   07/08/2009 - 12/02/2009 Chemotherapy   He was given 6 cycles of carboplatin , 5-FU and docetaxel   12/03/2009 Surgery   He has surgery to the residual lymph node on the right neck which showed no evidence of disease.   02/22/2012 Imaging   Repeat imaging study showed large recurrent mass. He was referred elsewhere for further treatment.   05/03/2012 Surgery   He underwent left upper lobectomy.   04/29/2013 Imaging   PEt scan showed lesion on right level II B and lower lung was abnormal   06/03/2013 - 02/02/2014 Chemotherapy   He had 6 cycles of chemotherapy when he was found to have recurrence of cancer and had received oxaliplatin and capecitabine   06/07/2014 Imaging   PET CT scan showed persistent disease in the right neck lymph nodes and left lung   06/29/2014 Procedure   Accession: [id number] repeat LUL biopsy confirmed metastatic cancer   07/18/2014 - 07/31/2014  Radiation Therapy   He received palliative radiation therapy to the lungs   10/10/2014 Imaging   CT scan of the chest, abdomen and pelvis show regression in the size of the lung nodule in the left upper lobe and stable pulmonary nodules   01/24/2015 Imaging   CT scan showed stable disease in neck and lung   06/19/2015 Imaging   CT scan of the neck and the chest show possible mild progression of the nodule in the right side of the neck.   06/25/2015 Imaging   PET scan confirmed disease recurrence in the neck   07/07/2015 Imaging   He had [facility]   09/03/2015 - 08/26/2018 Chemotherapy   He received palliative chemo with Nivolumab    10/29/2015 Imaging   PET CT showed positive response to Rx   02/28/2016 Imaging   Ct abdomen showed abnormal thinkening in his stomach   03/03/2016 Imaging   CT: Right sternocleidomastoid muscle metastasis appears less distinct but otherwise not significantly changed in  size or configuration since 06/19/2015.2. Left level 3 lymph node which was hypermetabolic by PET-CT in January 2017 appears slightly smaller   04/01/2016 Imaging   CT cervical spine showed no acute fracture or traumatic malalignment in the cervical spine   04/22/2016 Procedure   Port-a-cath placed.   06/16/2016 Imaging   Ct neck showed right sternocleidomastoid muscle metastasis is further decreased in conspicuity since May, and has mildly decreased in size since September 2016. Continued stability of sub-centimeter left cervical lymph nodes. No new or progressive metastatic disease in the neck.   06/16/2016 Imaging   CT chest showed stable masslike radiation fibrosis in the left upper lobe. Stable subcentimeter pulmonary nodules in the bilateral lower lobes. No new or progressive metastatic disease in the chest. Nonobstructing left renal stone.   10/13/2016 Imaging   Ct neck showed unchanged right sternocleidomastoid muscle metastasis. Unchanged subcentimeter left cervical lymph nodes. No evidence of new or progressive metastatic disease in the neck.   10/13/2016 Imaging   CT chest showed tiny hypervascular foci in the liver, not definitely seen on prior imaging of 06/16/2016 and 02/28/2016. Abdomen MRI without and with contrast recommended to further evaluate as metastatic disease is a concern. 2. Stable appearance of post treatment changes left upper lung and scattered tiny bilateral pulmonary nodules.   02/11/2017 Imaging   Ct neck: Lymph node mass right posterior neck appears improved from the prior study. Small posterior lymph nodes on the left unchanged. Occluded right jugular vein unchanged.   02/11/2017 Imaging   1. Similar appearance of postsurgical and radiation changes in the left upper lobe. 2. Similar bilateral pulmonary nodules. 3. No thoracic adenopathy. 4. Subtle foci of post-contrast enhancement within the liver are suboptimally characterized on this nondedicated study. Likely  similar. These could either be re-evaluated at followup or more entirely characterized with abdominal MRI. 5. Left nephrolithiasis.   05/19/2017 Imaging   Matted lymph node mass right posterior neck appears larger in the recent CT. Accurate measurements difficult due to infiltrating tumor margins and infiltration of the muscle. Right jugular vein again appears occluded or resected. Small left posterior lymph nodes stable. Left upper lobe airspace density stable and similar to the prior CT   06/03/2017 PET scan   1. Hypermetabolic ill-defined right level IIb lymph node, about 1.3 cm in diameter with maximum SUV 9.5 (formerly 8.1). Appearance suspicious for residual/recurrent malignancy. No worrisome left-sided lesion. 2. Left suprahilar indistinct opacity demonstrates no worrisome hypermetabolic activity. The 5  mm left lower lobe pulmonary nodule is stable and not currently hypermetabolic although below sensitive PET-CT size thresholds. 3. Other imaging findings of potential clinical significance: Bilateral nonobstructive nephrolithiasis. Chronic bilateral maxillary sinusitis.   05/11/2018 PET scan   1. Continued chronic accentuated metabolic activity in the vicinity of right level IIB and the adjacent right sternocleidomastoid muscle, with ill definition of surrounding tissue planes. Maximum SUV is currently 8.1, formerly 9.5. Accentuated metabolic activity is been present in this vicinity back through 06/25/2015, and there was also some low-level activity in this vicinity on 06/07/2014. Some of this may be from scarring and local muscular activity although clearly a component of residual tumor is difficult to exclude given the focally high activity. 2. Other imaging findings of potential clinical significance: Chronic bilateral maxillary sinusitis. Chronic scarring in the left upper lobe. Chronically stable 5 mm left lower lobe nodule is considered benign. Nonobstructive left nephrolithiasis.   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 COMMENT:No significant keratinization is identified. Other basaloid carcinomas are in the differential diagnosis. No cell block material is available for further testing.   09/12/2018 Procedure   He underwent fine Needle Aspiration   10/04/2018 PET scan   1. Significant progression of local recurrence laterally in the mid right neck with an enlarging, increasingly hypermetabolic soft tissue mass. This involves the right sternocleidomastoid muscle. 2. Small lymph nodes in the right axilla are increasingly hypermetabolic. These are nonspecific and potentially reactive, although could reflect a small metastases. Small hypermetabolic nodule in the left suprasternal notch is unchanged. 3. No other evidence of metastatic disease.     10/07/2018 - 12/23/2018 Chemotherapy   The patient had cisplatin  plus gemzar    12/07/2018 Imaging   1. Decreased size of lateral right neck mass. 2. Unchanged soft tissue nodule in the suprasternal notch. 3. No evidence of new metastatic disease in the neck.     05/30/2019 Imaging   CT neck No clear change or progression compared to the study of March. Overall measurements of the right lateral neck mass are similar, approximately 3 x 1.8 cm. See above discussion. One could argue that there is slight increase in lateral bulging, possibly with an increase in contrast enhancement, towards the inferior margin. This is of questionable validity but could possibly represent some progression or inflammatory change. Other findings in the region are stable.   07/20/2019 - 09/15/2019 Chemotherapy   The patient had gemzar  and cisplatin    10/02/2019 Imaging   CT neck As compared to 05/30/2019, no significant interval change in size of an ill-defined mass within the right lateral neck, again measuring  3.3 x 1.8 cm in transaxial dimensions.   Unchanged mildly enlarged left level I lymph node measuring 1.1 cm in short axis.   Unchanged node or nodule at the thoracic inlet, measuring 1.3 x 0.8 cm.   Please refer to concurrently performed chest CT for a description of findings below the level of the thoracic inlet.     10/02/2019 Imaging   CT chest 1. No new or progressive findings in the chest to suggest metastatic disease. 2. Bilateral subcentimeter solid pulmonary nodules are stable since 2018. 3. Hyperdense 1.1 cm anterior liver focus, not clearly visualized on prior studies. Suggest MRI abdomen without and with IV contrast for further characterization.   10/20/2019 - 12/29/2019 Chemotherapy   The patient had gemzar   maintenance   06/12/2020 Imaging   1. Enlarging superficial, exophytic component of the chronic right sternocleidomastoid muscle mass. See series 6, image 55. 2. Elsewhere stable CT appearance of the Neck.   06/12/2020 Imaging   Post treatment scarring in the left hemithorax, stable. No evidence recurrent or metastatic disease   06/14/2020 - 10/15/2020 Chemotherapy   He received carboplatin , 5FU and keytruda        10/31/2020 Procedure   Interval improvement in right lateral lymph node mass. Improvement in dermal component as well as invasion of the right sternocleidomastoid muscle.   10 mm submental lymph node slightly enlarged compared to the prior study. Continued follow-up recommended.   11/01/2020 - 05/22/2022 Chemotherapy   Patient is on Treatment Plan : HEAD/NECK Pembrolizumab  Q21D     07/03/2022 - 10/30/2022 Chemotherapy   Patient is on Treatment Plan : Head and neck Pembrolizumab  (400) q42d     11/30/2022 Imaging   CT chest  1. Stable exam. No new or progressive findings to suggest recurrent or metastatic disease. 2. Stable tiny bilateral nodules since 06/12/2020, consistent with benign etiology. 3. Punctate nonobstructing left renal stone.   12/01/2022  Imaging   CT neck  Growing right neck mass since 2022 with epidural tumor extension via the right C2-3 foramen. Cervical MRI with contrast would be contributory.   12/11/2022 - 04/02/2023 Chemotherapy   Patient is on Treatment Plan : HEAD/NECK toripalimab -tpzi D1 + cisplatin  D1 + gemcitabine  D1,8 q21d x 6 cycles / toripalimab -tpzi q21d (up to 7m)     04/15/2023 Imaging   CT Chest W Contrast  Result Date: 04/15/2023 CLINICAL DATA:  Right neck mass, nasopharyngeal carcinoma EXAM: CT CHEST WITH CONTRAST TECHNIQUE: Multidetector CT imaging of the chest was performed during intravenous contrast administration. RADIATION DOSE REDUCTION: This exam was performed according to the departmental dose-optimization program which includes automated exposure control, adjustment of the mA and/or kV according to patient size and/or use of iterative reconstruction technique. CONTRAST:  75mL OMNIPAQUE  IOHEXOL  300 MG/ML  SOLN COMPARISON:  11/27/2022 FINDINGS: Cardiovascular: Right Port-A-Cath tip: Right atrium. Mediastinum/Nodes: Small left supraclavicular lymph node 0.5 cm in short axis on image 4 series 3, formerly the same. No pathologic adenopathy in the chest. Lungs/Pleura: Biapical pleuroparenchymal scarring, right greater than left. Stable left upper lobe scarring and peribronchovascular density with wedge resection line in the left upper lobe, no significant contour change of density in this vicinity to suggest malignancy. Stable 5 by 4 mm left lower lobe nodule and stable 3 by 4 mm right lower lobe nodule, unchanged from at least 2021, compatible with benign etiology. No new lesion identified. Upper Abdomen: 2 mm left kidney upper pole nonobstructive renal calculus, image 43 series 7. Musculoskeletal: Unremarkable IMPRESSION: 1. Stable postoperative findings in the left upper lobe. 2. Stable small bilateral lower lobe pulmonary nodules, unchanged from at least 2021, compatible with benign etiology. 3. 2 mm left kidney  upper pole nonobstructive renal calculus. Electronically Signed   By: Ryan Salvage M.D.   On: 04/15/2023 14:03   CT Soft Tissue Neck W Contrast  Result Date: 04/15/2023 CLINICAL DATA:  Head/neck cancer.  Last chemo 04/02/2023. EXAM: CT NECK WITH CONTRAST TECHNIQUE: Multidetector CT imaging of the neck was performed using the standard protocol following the bolus administration of intravenous contrast. RADIATION DOSE REDUCTION: This exam was performed according to the departmental dose-optimization program which includes automated exposure control, adjustment of the mA and/or kV according to patient size and/or use of iterative reconstruction technique. CONTRAST:  75mL OMNIPAQUE  IOHEXOL  300 MG/ML  SOLN COMPARISON:  CT neck 11/27/2022. FINDINGS: Pharynx and larynx: There is probable mucoid debris in the right nasal cavity/nasopharynx. The nasal cavity and nasopharynx are otherwise unremarkable. The oral cavity and oropharynx are unremarkable. The parapharyngeal spaces are clear. The hypopharynx and larynx are unremarkable. The epiglottis is normal. There is no retropharyngeal collection. The airway is patent. Salivary glands: Atrophy of the parotid and left submandibular glands is unchanged. The right submandibular gland is again not identified and may be surgically absent. Thyroid : Unremarkable. Lymph nodes: The infiltrative mass in the right neck is again seen. The mass invades through the right C2-C3 neural foramen into the epidural space resulting in mild narrowing of the thecal sac without suspected cord compression. The degree of intraspinal epidural tumor appears slightly increased compared to the study from 11/27/2022. Mass again encases the vertebral artery at the C2-C3 level (4-36), unchanged. The remainder of the infiltrative mass in the paraspinal musculature with extension into the overlying skin surface does not appear significantly changed in size or extent compared to the prior study. A 0.9  cm midline level I lymph node is unchanged going back to 2021. Otherwise, there is no new or progressive lymphadenopathy in the neck. Vascular: As above, there is encasement of the right vertebral artery at the C2-C3 level by the right neck mass. There is mild calcified plaque at the carotid bifurcations. The right IJ is occluded or sacrificed, unchanged. The left IJ is patent. A right chest wall port is partially imaged. Limited intracranial: Unremarkable. Visualized orbits: Unremarkable. Mastoids and visualized paranasal sinuses: There is mild mucosal thickening in the maxillary sinuses. The mastoid air cells and middle ear cavities are clear. Skeleton: There is no acute osseous abnormality or suspicious osseous lesion. Upper chest: Assessed on the separately dictated CT chest. Other: None. IMPRESSION: 1. Infiltrative mass again seen in the right neck centered in the paraspinal musculature with invasion through the right C2-C3 neural foramen. The bulk of the epidural tumor at this level appears slightly increased in size compared to the prior study from 11/27/2022 with probable leftward displacement of the cord but without frank cord compression. The remainder of the mass in the right neck is otherwise not significantly changed. 2. No new or progressive lymphadenopathy in the remainder of the neck. 3. Probable mucoid debris in the right nasal cavity/nasopharynx. No suspicious mass lesion or enhancement in the nasopharynx. Electronically Signed   By: Maude Harry M.D.   On: 04/15/2023 14:02      05/07/2023 - 09/10/2023 Chemotherapy   Patient is on Treatment Plan : HEAD/NECK Cetuximab  q14d + Carboplatin  + 5FU IVCI D1-4 q21d x 6 cycles / Cetuximab  q14d     10/01/2023 -  Chemotherapy   Patient is on Treatment Plan : HEAD/NECK Carboplatin  + Paclitaxel  q21d     10/04/2023 Imaging   CT Soft Tissue Neck W Contrast Result Date: 10/04/2023 CLINICAL DATA:  Neck mass, nonpulsatile neck swelling EXAM: CT NECK WITH  CONTRAST TECHNIQUE: Multidetector CT imaging of the neck was performed using the standard protocol following the bolus administration of intravenous contrast. RADIATION DOSE REDUCTION: This exam was performed according to the departmental dose-optimization program which includes automated exposure control, adjustment of the mA and/or kV according to patient size and/or use of iterative reconstruction technique. CONTRAST:  75mL OMNIPAQUE  IOHEXOL  300 MG/ML  SOLN COMPARISON:  CT neck 04/13/2023. FINDINGS: Pharynx and larynx: Unremarkable nasopharynx. Salivary glands: Atrophic.  No evidence of acute abnormality. Thyroid : Normal. Lymph  nodes: The infiltrative mass in the right neck is slightly decreased in size/bulk, now measuring approximately 3.7 x 2.8 cm. 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 The tumor extends superficially to the skin surface, similar to prior. Mildly increased central areas of necrosis/cystic change within the mass. Stable midline 9 mm level I lymph node. No new or progressive lymphadenopathy. 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 Calcific atherosclerosis. Limited intracranial: No obvious acute abnormality on limited assessment and partial visualization. Visualized orbits: Negative. Mastoids and visualized paranasal sinuses: Clear. Skeleton: No acute or aggressive process. Upper chest: Chronic postoperative changes in the left upper lobe, better characterized on prior CT of the chest. IMPRESSION: 1. 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 2. No new or progressive lymphadenopathy. Electronically Signed   By: Gilmore GORMAN Molt M.D.   On: 10/04/2023 17:15        PHYSICAL EXAMINATION: ECOG PERFORMANCE STATUS: 1 - Symptomatic but completely ambulatory  Vitals:   11/12/23 1109 11/12/23 1110   BP: (!) 143/97 (!) 147/90  Pulse: 93   Resp: 19   Temp: 98.3 F (36.8 C)   SpO2: 93%    Filed Weights   11/12/23 1109  Weight: 179 lb 9.6 oz (81.5 kg)    GENERAL:alert, no distress and comfortable SKIN: skin color, texture, turgor are normal, no rashes or significant lesions EYES: normal, Conjunctiva are pink and non-injected, sclera clear OROPHARYNX:no exudate, no erythema and lips, buccal mucosa, and tongue normal  NECK: Significant fibrosis on the right side of his neck.  Previously visible mass has resolved LYMPH:  no palpable lymphadenopathy in the cervical, axillary or inguinal LUNGS: clear to auscultation and percussion with normal breathing effort HEART: regular rate & rhythm and no murmurs and no lower extremity edema ABDOMEN:abdomen soft, non-tender and normal bowel sounds Musculoskeletal:no cyanosis of digits and no clubbing  NEURO: alert & oriented x 3 with fluent speech, no focal motor/sensory deficits  LABORATORY DATA:  I have reviewed the data as listed    Component Value Date/Time   NA 138 11/12/2023 1045   NA 139 09/22/2017 0829   K 3.9 11/12/2023 1045   K 3.5 09/22/2017 0829   CL 103 11/12/2023 1045   CO2 29 11/12/2023 1045   CO2 26 09/22/2017 0829   GLUCOSE 227 (H) 11/12/2023 1045   GLUCOSE 133 09/22/2017 0829   BUN 13 11/12/2023 1045   BUN 14.1 09/22/2017 0829   CREATININE 0.85 11/12/2023 1045   CREATININE 0.9 09/22/2017 0829   CALCIUM  9.2 11/12/2023 1045   CALCIUM  9.1 09/22/2017 0829   PROT 7.5 11/12/2023 1045   PROT 6.8 09/22/2017 0829   ALBUMIN 4.6 11/12/2023 1045   ALBUMIN 4.1 09/22/2017 0829   AST 19 11/12/2023 1045   AST 22 09/22/2017 0829   ALT 13 11/12/2023 1045   ALT 30 09/22/2017 0829   ALKPHOS 69 11/12/2023 1045   ALKPHOS 55 09/22/2017 0829   BILITOT 0.3 11/12/2023 1045   BILITOT 0.35 09/22/2017 0829   GFRNONAA >60 11/12/2023 1045   GFRAA >60 07/05/2020 0845   GFRAA >60 02/06/2019 1215    No results found for: SPEP,  UPEP  Lab Results  Component Value Date   WBC 3.5 (L) 11/12/2023   NEUTROABS 3.1 11/12/2023   HGB 11.0 (L) 11/12/2023   HCT 34.2 (L) 11/12/2023   MCV  87.7 11/12/2023   PLT 212 11/12/2023      Chemistry      Component Value Date/Time   NA 138 11/12/2023 1045   NA 139 09/22/2017 0829   K 3.9 11/12/2023 1045   K 3.5 09/22/2017 0829   CL 103 11/12/2023 1045   CO2 29 11/12/2023 1045   CO2 26 09/22/2017 0829   BUN 13 11/12/2023 1045   BUN 14.1 09/22/2017 0829   CREATININE 0.85 11/12/2023 1045   CREATININE 0.9 09/22/2017 0829      Component Value Date/Time   CALCIUM  9.2 11/12/2023 1045   CALCIUM  9.1 09/22/2017 0829   ALKPHOS 69 11/12/2023 1045   ALKPHOS 55 09/22/2017 0829   AST 19 11/12/2023 1045   AST 22 09/22/2017 0829   ALT 13 11/12/2023 1045   ALT 30 09/22/2017 0829   BILITOT 0.3 11/12/2023 1045   BILITOT 0.35 09/22/2017 0829

## 2023-11-12 NOTE — Assessment & Plan Note (Signed)
 His pain control is stable He is okay for me to reduce the dose of oxycodone

## 2023-11-13 ENCOUNTER — Inpatient Hospital Stay: Payer: Medicaid Other

## 2023-11-13 VITALS — BP 118/79 | HR 64 | Temp 98.1°F | Resp 18

## 2023-11-13 DIAGNOSIS — Z5111 Encounter for antineoplastic chemotherapy: Secondary | ICD-10-CM | POA: Diagnosis not present

## 2023-11-13 DIAGNOSIS — C760 Malignant neoplasm of head, face and neck: Secondary | ICD-10-CM

## 2023-11-13 DIAGNOSIS — C7800 Secondary malignant neoplasm of unspecified lung: Secondary | ICD-10-CM

## 2023-11-13 DIAGNOSIS — Z95828 Presence of other vascular implants and grafts: Secondary | ICD-10-CM

## 2023-11-13 MED ORDER — DIPHENHYDRAMINE HCL 50 MG/ML IJ SOLN
25.0000 mg | Freq: Once | INTRAMUSCULAR | Status: AC
  Administered 2023-11-13: 25 mg via INTRAVENOUS
  Filled 2023-11-13: qty 1

## 2023-11-13 MED ORDER — SODIUM CHLORIDE 0.9 % IV SOLN: Freq: Once | INTRAVENOUS | Status: AC

## 2023-11-13 MED ORDER — SODIUM CHLORIDE 0.9% FLUSH
10.0000 mL | Freq: Once | INTRAVENOUS | Status: AC
  Administered 2023-11-13: 10 mL

## 2023-11-13 NOTE — Patient Instructions (Signed)

## 2023-11-17 ENCOUNTER — Other Ambulatory Visit (HOSPITAL_BASED_OUTPATIENT_CLINIC_OR_DEPARTMENT_OTHER): Payer: Self-pay

## 2023-11-17 ENCOUNTER — Other Ambulatory Visit: Payer: Self-pay | Admitting: Hematology and Oncology

## 2023-11-17 MED ORDER — LORAZEPAM 0.5 MG PO TABS
0.5000 mg | ORAL_TABLET | Freq: Two times a day (BID) | ORAL | 0 refills | Status: DC | PRN
  Filled 2023-11-17: qty 60, 30d supply, fill #0

## 2023-11-18 ENCOUNTER — Telehealth: Payer: Self-pay

## 2023-11-18 ENCOUNTER — Other Ambulatory Visit (HOSPITAL_BASED_OUTPATIENT_CLINIC_OR_DEPARTMENT_OTHER): Payer: Self-pay

## 2023-11-18 ENCOUNTER — Other Ambulatory Visit: Payer: Self-pay | Admitting: Hematology and Oncology

## 2023-11-18 MED ORDER — TIZANIDINE HCL 2 MG PO TABS
2.0000 mg | ORAL_TABLET | Freq: Four times a day (QID) | ORAL | 2 refills | Status: DC | PRN
  Filled 2023-11-18: qty 60, 15d supply, fill #0
  Filled 2023-12-27 (×2): qty 60, 15d supply, fill #1

## 2023-11-18 NOTE — Telephone Encounter (Signed)
He called and is requesting a refill on Zanaflex Rx to [facility] please.

## 2023-11-18 NOTE — Telephone Encounter (Signed)
Called and told him Rx sent. He verbalized understanding.

## 2023-11-18 NOTE — Telephone Encounter (Signed)
done

## 2023-11-19 ENCOUNTER — Other Ambulatory Visit (HOSPITAL_BASED_OUTPATIENT_CLINIC_OR_DEPARTMENT_OTHER): Payer: Self-pay

## 2023-11-24 ENCOUNTER — Other Ambulatory Visit (HOSPITAL_COMMUNITY): Payer: Self-pay

## 2023-11-26 ENCOUNTER — Other Ambulatory Visit: Payer: Self-pay | Admitting: Hematology and Oncology

## 2023-12-01 ENCOUNTER — Other Ambulatory Visit: Payer: Self-pay | Admitting: Hematology and Oncology

## 2023-12-01 ENCOUNTER — Other Ambulatory Visit (HOSPITAL_BASED_OUTPATIENT_CLINIC_OR_DEPARTMENT_OTHER): Payer: Self-pay

## 2023-12-01 DIAGNOSIS — C760 Malignant neoplasm of head, face and neck: Secondary | ICD-10-CM

## 2023-12-01 MED ORDER — METHADONE HCL 10 MG PO TABS
10.0000 mg | ORAL_TABLET | Freq: Three times a day (TID) | ORAL | 0 refills | Status: DC
  Filled 2023-12-01: qty 90, 30d supply, fill #0

## 2023-12-02 ENCOUNTER — Other Ambulatory Visit: Payer: Self-pay

## 2023-12-02 MED FILL — Fosaprepitant Dimeglumine For IV Infusion 150 MG (Base Eq): INTRAVENOUS | Qty: 5 | Status: AC

## 2023-12-03 ENCOUNTER — Encounter: Payer: Self-pay | Admitting: Hematology and Oncology

## 2023-12-03 ENCOUNTER — Inpatient Hospital Stay: Payer: Medicaid Other

## 2023-12-03 ENCOUNTER — Inpatient Hospital Stay: Payer: Medicaid Other | Admitting: Hematology and Oncology

## 2023-12-03 ENCOUNTER — Other Ambulatory Visit: Payer: Self-pay

## 2023-12-03 ENCOUNTER — Ambulatory Visit: Payer: Medicaid Other | Admitting: Dietician

## 2023-12-03 ENCOUNTER — Other Ambulatory Visit (HOSPITAL_COMMUNITY): Payer: Self-pay

## 2023-12-03 VITALS — BP 156/87 | HR 84 | Temp 98.3°F | Resp 18 | Ht 66.0 in | Wt 172.2 lb

## 2023-12-03 DIAGNOSIS — Z95828 Presence of other vascular implants and grafts: Secondary | ICD-10-CM

## 2023-12-03 DIAGNOSIS — C760 Malignant neoplasm of head, face and neck: Secondary | ICD-10-CM

## 2023-12-03 DIAGNOSIS — Z7189 Other specified counseling: Secondary | ICD-10-CM

## 2023-12-03 DIAGNOSIS — C7800 Secondary malignant neoplasm of unspecified lung: Secondary | ICD-10-CM

## 2023-12-03 DIAGNOSIS — T451X5A Adverse effect of antineoplastic and immunosuppressive drugs, initial encounter: Secondary | ICD-10-CM

## 2023-12-03 DIAGNOSIS — M542 Cervicalgia: Secondary | ICD-10-CM

## 2023-12-03 DIAGNOSIS — Z5111 Encounter for antineoplastic chemotherapy: Secondary | ICD-10-CM | POA: Diagnosis not present

## 2023-12-03 DIAGNOSIS — E039 Hypothyroidism, unspecified: Secondary | ICD-10-CM

## 2023-12-03 DIAGNOSIS — D6481 Anemia due to antineoplastic chemotherapy: Secondary | ICD-10-CM

## 2023-12-03 DIAGNOSIS — G620 Drug-induced polyneuropathy: Secondary | ICD-10-CM

## 2023-12-03 DIAGNOSIS — C119 Malignant neoplasm of nasopharynx, unspecified: Secondary | ICD-10-CM

## 2023-12-03 LAB — CMP (CANCER CENTER ONLY)
ALT: 18 U/L (ref 0–44)
AST: 21 U/L (ref 15–41)
Albumin: 5.2 g/dL — ABNORMAL HIGH (ref 3.5–5.0)
Alkaline Phosphatase: 70 U/L (ref 38–126)
Anion gap: 8 (ref 5–15)
BUN: 15 mg/dL (ref 6–20)
CO2: 29 mmol/L (ref 22–32)
Calcium: 10.3 mg/dL (ref 8.9–10.3)
Chloride: 101 mmol/L (ref 98–111)
Creatinine: 0.84 mg/dL (ref 0.61–1.24)
GFR, Estimated: >60 mL/min (ref >60)
Glucose, Bld: 145 mg/dL — ABNORMAL HIGH (ref 70–99)
Potassium: 3.7 mmol/L (ref 3.5–5.1)
Sodium: 138 mmol/L (ref 135–145)
Total Bilirubin: 0.5 mg/dL (ref 0.0–1.2)
Total Protein: 8.5 g/dL — ABNORMAL HIGH (ref 6.5–8.1)

## 2023-12-03 LAB — CBC WITH DIFFERENTIAL (CANCER CENTER ONLY)
Abs Immature Granulocytes: 0.02 10*3/uL (ref 0.00–0.07)
Basophils Absolute: 0 10*3/uL (ref 0.0–0.1)
Basophils Relative: 0 %
Eosinophils Absolute: 0 10*3/uL (ref 0.0–0.5)
Eosinophils Relative: 0 %
HCT: 37.2 % — ABNORMAL LOW (ref 39.0–52.0)
Hemoglobin: 11.9 g/dL — ABNORMAL LOW (ref 13.0–17.0)
Immature Granulocytes: 0 %
Lymphocytes Relative: 11 %
Lymphs Abs: 0.6 10*3/uL — ABNORMAL LOW (ref 0.7–4.0)
MCH: 27.8 pg (ref 26.0–34.0)
MCHC: 32 g/dL (ref 30.0–36.0)
MCV: 86.9 fL (ref 80.0–100.0)
Monocytes Absolute: 0.2 10*3/uL (ref 0.1–1.0)
Monocytes Relative: 3 %
Neutro Abs: 4.9 10*3/uL (ref 1.7–7.7)
Neutrophils Relative %: 86 %
Platelet Count: 276 10*3/uL (ref 150–400)
RBC: 4.28 MIL/uL (ref 4.22–5.81)
RDW: 17 % — ABNORMAL HIGH (ref 11.5–15.5)
WBC Count: 5.8 10*3/uL (ref 4.0–10.5)
nRBC: 0 % (ref 0.0–0.2)

## 2023-12-03 LAB — T4, FREE: Free T4: 0.6 ng/dL — ABNORMAL LOW (ref 0.61–1.12)

## 2023-12-03 MED ORDER — SODIUM CHLORIDE 0.9 % IV SOLN: INTRAVENOUS | Status: DC

## 2023-12-03 MED ORDER — SODIUM CHLORIDE 0.9 % IV SOLN
678.0000 mg | Freq: Once | INTRAVENOUS | Status: AC
  Administered 2023-12-03: 680 mg via INTRAVENOUS
  Filled 2023-12-03: qty 68

## 2023-12-03 MED ORDER — FAMOTIDINE IN NACL 20-0.9 MG/50ML-% IV SOLN
20.0000 mg | Freq: Once | INTRAVENOUS | Status: AC
  Administered 2023-12-03: 20 mg via INTRAVENOUS
  Filled 2023-12-03: qty 50

## 2023-12-03 MED ORDER — SODIUM CHLORIDE 0.9 % IV SOLN
112.5000 mg/m2 | Freq: Once | INTRAVENOUS | Status: AC
  Administered 2023-12-03: 216 mg via INTRAVENOUS
  Filled 2023-12-03: qty 36

## 2023-12-03 MED ORDER — OXYCODONE HCL 10 MG PO TABS
10.0000 mg | ORAL_TABLET | ORAL | 0 refills | Status: DC | PRN
  Filled 2023-12-03 – 2023-12-04 (×2): qty 90, 15d supply, fill #0

## 2023-12-03 MED ORDER — SODIUM CHLORIDE 0.9% FLUSH
10.0000 mL | INTRAVENOUS | Status: DC | PRN
  Administered 2023-12-03: 10 mL

## 2023-12-03 MED ORDER — ALTEPLASE 2 MG IJ SOLR
2.0000 mg | Freq: Once | INTRAMUSCULAR | Status: AC
Start: 2023-12-03 — End: 2023-12-03
  Administered 2023-12-03: 2 mg
  Filled 2023-12-03: qty 2

## 2023-12-03 MED ORDER — SODIUM CHLORIDE 0.9 % IV SOLN
150.0000 mg | Freq: Once | INTRAVENOUS | Status: AC
  Administered 2023-12-03: 150 mg via INTRAVENOUS
  Filled 2023-12-03: qty 150

## 2023-12-03 MED ORDER — SODIUM CHLORIDE 0.9% FLUSH
10.0000 mL | Freq: Once | INTRAVENOUS | Status: AC
Start: 2023-12-03 — End: 2023-12-03
  Administered 2023-12-03: 10 mL

## 2023-12-03 MED ORDER — DEXAMETHASONE SODIUM PHOSPHATE 10 MG/ML IJ SOLN
10.0000 mg | Freq: Once | INTRAMUSCULAR | Status: AC
  Administered 2023-12-03: 10 mg via INTRAVENOUS
  Filled 2023-12-03: qty 1

## 2023-12-03 MED ORDER — CETIRIZINE HCL 10 MG/ML IV SOLN
10.0000 mg | Freq: Once | INTRAVENOUS | Status: AC
  Administered 2023-12-03: 10 mg via INTRAVENOUS
  Filled 2023-12-03: qty 1

## 2023-12-03 MED ORDER — PALONOSETRON HCL INJECTION 0.25 MG/5ML
0.2500 mg | Freq: Once | INTRAVENOUS | Status: AC
  Administered 2023-12-03: 0.25 mg via INTRAVENOUS
  Filled 2023-12-03: qty 5

## 2023-12-03 NOTE — Patient Instructions (Signed)
 [facility] - A DEPT OF [facility]  Discharge Instructions: Thank you for choosing [facility] to provide your oncology and hematology care.   If you have a lab appointment with the Cancer Center, please go directly to the Cancer Center and check in at the registration area.   Wear comfortable clothing and clothing appropriate for easy access to any Portacath or PICC line.   We strive to give you quality time with your provider. You may need to reschedule your appointment if you arrive late (15 or more minutes).  Arriving late affects you and other patients whose appointments are after yours.  Also, if you miss three or more appointments without notifying the office, you may be dismissed from the clinic at the provider's discretion.      For prescription refill requests, have your pharmacy contact our office and allow 72 hours for refills to be completed.    Today you received the following chemotherapy and/or immunotherapy agents Taxol, Carboplatin      To help prevent nausea and vomiting after your treatment, we encourage you to take your nausea medication as directed.  BELOW ARE SYMPTOMS THAT SHOULD BE REPORTED IMMEDIATELY: *FEVER GREATER THAN 100.4 F (38 C) OR HIGHER *CHILLS OR SWEATING *NAUSEA AND VOMITING THAT IS NOT CONTROLLED WITH YOUR NAUSEA MEDICATION *UNUSUAL SHORTNESS OF BREATH *UNUSUAL BRUISING OR BLEEDING *URINARY PROBLEMS (pain or burning when urinating, or frequent urination) *BOWEL PROBLEMS (unusual diarrhea, constipation, pain near the anus) TENDERNESS IN MOUTH AND THROAT WITH OR WITHOUT PRESENCE OF ULCERS (sore throat, sores in mouth, or a toothache) UNUSUAL RASH, SWELLING OR PAIN  UNUSUAL VAGINAL DISCHARGE OR ITCHING   Items with * indicate a potential emergency and should be followed up as soon as possible or go to the Emergency Department if any problems should occur.  Please show the CHEMOTHERAPY ALERT CARD or  IMMUNOTHERAPY ALERT CARD at check-in to the Emergency Department and triage nurse.  Should you have questions after your visit or need to cancel or reschedule your appointment, please contact [facility] - A DEPT OF [facility]  Dept: [phone]  and follow the prompts.  Office hours are 8:00 a.m. to 4:30 p.m. Monday - Friday. Please note that voicemails left after 4:00 p.m. may not be returned until the following business day.  We are closed weekends and major holidays. You have access to a nurse at all times for urgent questions. Please call the main number to the clinic Dept: [phone] and follow the prompts.   For any non-urgent questions, you may also contact your provider using MyChart. We now offer e-Visits for anyone 64 and older to request care online for non-urgent symptoms. For details visit [email or web address].   Also download the MyChart app! Go to the app store, search "MyChart", open the app, select [facility], and log in with your MyChart username and password.

## 2023-12-03 NOTE — Progress Notes (Signed)
 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 but with no residual signs of disease in his lungs on recent imaging studies Typically, he tends to relapse on the right side of his neck Previous treatments included on Nivolumab, pembrolizumab, gemzar, cisplatin and toripalimab, cetuximab   The patient has excellent response to current chemotherapy with carboplatin and paclitaxel Due to side effects, I recommend spacing out his treatment to every 4 weeks I will also adjust the dose of his treatment due to recent weight loss We will provide additional supportive care with IV fluid support Neuropathy due to chemotherapeutic drug [facility]) He will continue with reduced dose of paclitaxel along with cryotherapy Neck pain on right side His pain control has improved I refilled his oxycodone prescription today Anemia due to antineoplastic chemotherapy This is likely due to recent treatment. The patient denies recent history of bleeding such as epistaxis, hematuria or hematochezia. He is asymptomatic from the anemia. I will observe for now.  He does not require transfusion now. I will continue the chemotherapy at current dose without dosage adjustment.  If the anemia gets progressive worse in the future, I might have to delay his treatment or adjust the chemotherapy dose.   Orders Placed This Encounter  Procedures   CBC with Differential (Cancer Center Only)    Standing Status:   Future    Expected Date:   12/31/2023    Expiration Date:   12/30/2024   CMP (Cancer  Center only)    Standing Status:   Future    Expected Date:   12/31/2023    Expiration Date:   12/30/2024   CBC with Differential (Cancer Center Only)    Standing Status:   Future    Expected Date:   01/28/2024    Expiration Date:   01/27/2025   CMP (Cancer Center only)    Standing Status:   Future    Expected Date:   01/28/2024    Expiration Date:   01/27/2025   CBC with Differential (Cancer Center Only)    Standing Status:   Future    Expected Date:   02/25/2024    Expiration Date:   02/24/2025   CMP (Cancer Center only)    Standing Status:   Future    Expected Date:   02/25/2024    Expiration Date:   02/24/2025     Artis Delay, MD  INTERVAL HISTORY: he returns for chemo follow-up Complications related to previous cycle of chemotherapy included anemia,, weight loss,, cancer associated pain,, and anxiety He was late for his appointment as he overslept He has anxiety from recent treatment His pain is well-controlled He has no signs of cancer relapse on his neck He denies worsening peripheral neuropathy  PHYSICAL EXAMINATION: ECOG PERFORMANCE STATUS: 1 - Symptomatic but completely ambulatory  Vitals:   12/03/23 1140  BP: (!) 156/87  Pulse: 84  Resp: 18  Temp: 98.3 F (36.8 C)  SpO2: 100%   Filed Weights   12/03/23 1140  Weight: 172 lb 3.2 oz (78.1 kg)    Relevant data reviewed during this visit included CBC  and CMP  SUMMARY OF ONCOLOGIC HISTORY: Oncology History Overview Note  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 Pan-endoscopy elsewhere showed cancer from nasopharyngeal space.   01/04/2007 - 02/20/2007 Chemotherapy   He received 2 cycles of cisplatin and 5FU followed by concurrent chemo with weekly cisplatin and radiation. He only received 2 doses of chemo due to severe mucositis, nausea and weight loss.   04/05/2007 - 08/04/2007 Chemotherapy   He received 4 more courses of cisplatin with 5FU and had complete response   07/05/2009 Procedure   Fine-needle aspirate of the right level II lymph nodes come from recurrent metastatic disease. Repeat endoscopy and CT scan show no evidence of disease elsewhere.   07/08/2009 - 12/02/2009 Chemotherapy   He was given 6 cycles of carboplatin, 5-FU and docetaxel   12/03/2009 Surgery   He has surgery to the residual lymph node on the right neck which showed no evidence of disease.   02/22/2012 Imaging   Repeat imaging study showed large recurrent mass. He was referred elsewhere for further treatment.   05/03/2012 Surgery   He underwent left upper lobectomy.   04/29/2013 Imaging   PEt scan showed lesion on right level II B and lower lung was abnormal   06/03/2013 - 02/02/2014 Chemotherapy   He had 6 cycles of chemotherapy when he was found to have recurrence of cancer and had received oxaliplatin and capecitabine   06/07/2014 Imaging   PET CT scan showed persistent disease in the right neck lymph nodes and left lung   06/29/2014 Procedure   Accession: [id number] repeat LUL biopsy confirmed metastatic cancer   07/18/2014 - 07/31/2014 Radiation Therapy   He received palliative radiation therapy to the lungs   10/10/2014 Imaging   CT scan of the chest, abdomen and pelvis show regression in the size of the lung nodule in the left upper lobe and stable pulmonary nodules   01/24/2015 Imaging   CT scan showed stable disease in neck and lung   06/19/2015 Imaging   CT scan of the neck and the chest show possible mild  progression of the nodule in the right side of the neck.   06/25/2015 Imaging   PET scan confirmed disease recurrence in the neck   07/07/2015 Imaging   He had [facility] neck [facility]   09/03/2015 - 08/26/2018 Chemotherapy   He received palliative chemo with Nivolumab   10/29/2015 Imaging   PET CT showed positive response to Rx   02/28/2016 Imaging   Ct abdomen showed abnormal thinkening in his stomach   03/03/2016 Imaging   CT: Right sternocleidomastoid muscle metastasis appears less distinct but otherwise not significantly changed in size or configuration since 06/19/2015.2. Left level 3 lymph node which was hypermetabolic by PET-CT in January 2017 appears slightly smaller   04/01/2016 Imaging   CT cervical spine showed no acute fracture or traumatic malalignment in the cervical spine   04/22/2016 Procedure   Port-a-cath placed.   06/16/2016 Imaging  Ct neck showed right sternocleidomastoid muscle metastasis is further decreased in conspicuity since May, and has mildly decreased in size since September 2016. Continued stability of sub-centimeter left cervical lymph nodes. No new or progressive metastatic disease in the neck.   06/16/2016 Imaging   CT chest showed stable masslike radiation fibrosis in the left upper lobe. Stable subcentimeter pulmonary nodules in the bilateral lower lobes. No new or progressive metastatic disease in the chest. Nonobstructing left renal stone.   10/13/2016 Imaging   Ct neck showed unchanged right sternocleidomastoid muscle metastasis. Unchanged subcentimeter left cervical lymph nodes. No evidence of new or progressive metastatic disease in the neck.   10/13/2016 Imaging   CT chest showed tiny hypervascular foci in the liver, not definitely seen on prior imaging of 06/16/2016 and 02/28/2016. Abdomen MRI without and with contrast recommended to further evaluate as metastatic disease is a concern. 2. Stable appearance of post treatment changes left upper lung and  scattered tiny bilateral pulmonary nodules.   02/11/2017 Imaging   Ct neck: Lymph node mass right posterior neck appears improved from the prior study. Small posterior lymph nodes on the left unchanged. Occluded right jugular vein unchanged.   02/11/2017 Imaging   1. Similar appearance of postsurgical and radiation changes in the left upper lobe. 2. Similar bilateral pulmonary nodules. 3. No thoracic adenopathy. 4. Subtle foci of post-contrast enhancement within the liver are suboptimally characterized on this nondedicated study. Likely similar. These could either be re-evaluated at followup or more entirely characterized with abdominal MRI. 5. Left nephrolithiasis.   05/19/2017 Imaging   Matted lymph node mass right posterior neck appears larger in the recent CT. Accurate measurements difficult due to infiltrating tumor margins and infiltration of the muscle. Right jugular vein again appears occluded or resected. Small left posterior lymph nodes stable. Left upper lobe airspace density stable and similar to the prior CT   06/03/2017 PET scan   1. Hypermetabolic ill-defined right level IIb lymph node, about 1.3 cm in diameter with maximum SUV 9.5 (formerly 8.1). Appearance suspicious for residual/recurrent malignancy. No worrisome left-sided lesion. 2. Left suprahilar indistinct opacity demonstrates no worrisome hypermetabolic activity. The 5 mm left lower lobe pulmonary nodule is stable and not currently hypermetabolic although below sensitive PET-CT size thresholds. 3. Other imaging findings of potential clinical significance: Bilateral nonobstructive nephrolithiasis. Chronic bilateral maxillary sinusitis.   05/11/2018 PET scan   1. Continued chronic accentuated metabolic activity in the vicinity of right level IIB and the adjacent right sternocleidomastoid muscle, with ill definition of surrounding tissue planes. Maximum SUV is currently 8.1, formerly 9.5. Accentuated metabolic activity is been  present in this vicinity back through 06/25/2015, and there was also some low-level activity in this vicinity on 06/07/2014. Some of this may be from scarring and local muscular activity although clearly a component of residual tumor is difficult to exclude given the focally high activity. 2. Other imaging findings of potential clinical significance: Chronic bilateral maxillary sinusitis. Chronic scarring in the left upper lobe. Chronically stable 5 mm left lower lobe nodule is considered benign. Nonobstructive left nephrolithiasis.   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 COMMENT:No significant keratinization is identified. Other basaloid carcinomas are in the differential diagnosis. No cell block material is available for further testing.   09/12/2018 Procedure   He underwent fine Needle Aspiration   10/04/2018 PET scan  1. Significant progression of local recurrence laterally in the mid right neck with an enlarging, increasingly hypermetabolic soft tissue mass. This involves the right sternocleidomastoid muscle. 2. Small lymph nodes in the right axilla are increasingly hypermetabolic. These are nonspecific and potentially reactive, although could reflect a small metastases. Small hypermetabolic nodule in the left suprasternal notch is unchanged. 3. No other evidence of metastatic disease.     10/07/2018 - 12/23/2018 Chemotherapy   The patient had cisplatin plus gemzar   12/07/2018 Imaging   1. Decreased size of lateral right neck mass. 2. Unchanged soft tissue nodule in the suprasternal notch. 3. No evidence of new metastatic disease in the neck.     05/30/2019 Imaging   CT neck No clear change or progression compared to the study of March. Overall measurements of the right lateral neck mass are similar, approximately 3 x 1.8 cm. See above  discussion. One could argue that there is slight increase in lateral bulging, possibly with an increase in contrast enhancement, towards the inferior margin. This is of questionable validity but could possibly represent some progression or inflammatory change. Other findings in the region are stable.   07/20/2019 - 09/15/2019 Chemotherapy   The patient had gemzar and cisplatin   10/02/2019 Imaging   CT neck As compared to 05/30/2019, no significant interval change in size of an ill-defined mass within the right lateral neck, again measuring 3.3 x 1.8 cm in transaxial dimensions.   Unchanged mildly enlarged left level I lymph node measuring 1.1 cm in short axis.   Unchanged node or nodule at the thoracic inlet, measuring 1.3 x 0.8 cm.   Please refer to concurrently performed chest CT for a description of findings below the level of the thoracic inlet.     10/02/2019 Imaging   CT chest 1. No new or progressive findings in the chest to suggest metastatic disease. 2. Bilateral subcentimeter solid pulmonary nodules are stable since 2018. 3. Hyperdense 1.1 cm anterior liver focus, not clearly visualized on prior studies. Suggest MRI abdomen without and with IV contrast for further characterization.   10/20/2019 - 12/29/2019 Chemotherapy   The patient had gemzar maintenance   06/12/2020 Imaging   1. Enlarging superficial, exophytic component of the chronic right sternocleidomastoid muscle mass. See series 6, image 55. 2. Elsewhere stable CT appearance of the Neck.   06/12/2020 Imaging   Post treatment scarring in the left hemithorax, stable. No evidence recurrent or metastatic disease   06/14/2020 - 10/15/2020 Chemotherapy   He received carboplatin, 5FU and Rande Lawman       10/31/2020 Procedure   Interval improvement in right lateral lymph node mass. Improvement in dermal component as well as invasion of the right sternocleidomastoid muscle.   10 mm submental lymph node slightly enlarged  compared to the prior study. Continued follow-up recommended.   11/01/2020 - 05/22/2022 Chemotherapy   Patient is on Treatment Plan : HEAD/NECK Pembrolizumab Q21D     07/03/2022 - 10/30/2022 Chemotherapy   Patient is on Treatment Plan : Head and neck Pembrolizumab (400) q42d     11/30/2022 Imaging   CT chest  1. Stable exam. No new or progressive findings to suggest recurrent or metastatic disease. 2. Stable tiny bilateral nodules since 06/12/2020, consistent with benign etiology. 3. Punctate nonobstructing left renal stone.   12/01/2022 Imaging   CT neck  Growing right neck mass since 2022 with epidural tumor extension via the right C2-3 foramen. Cervical MRI with contrast would be contributory.   12/11/2022 -  04/02/2023 Chemotherapy   Patient is on Treatment Plan : HEAD/NECK toripalimab-tpzi D1 + cisplatin D1 + gemcitabine D1,8 q21d x 6 cycles / toripalimab-tpzi q21d (up to 26m)     04/15/2023 Imaging   CT Chest W Contrast  Result Date: 04/15/2023 CLINICAL DATA:  Right neck mass, nasopharyngeal carcinoma EXAM: CT CHEST WITH CONTRAST TECHNIQUE: Multidetector CT imaging of the chest was performed during intravenous contrast administration. RADIATION DOSE REDUCTION: This exam was performed according to the departmental dose-optimization program which includes automated exposure control, adjustment of the mA and/or kV according to patient size and/or use of iterative reconstruction technique. CONTRAST:  75mL OMNIPAQUE IOHEXOL 300 MG/ML  SOLN COMPARISON:  11/27/2022 FINDINGS: Cardiovascular: Right Port-A-Cath tip: Right atrium. Mediastinum/Nodes: Small left supraclavicular lymph node 0.5 cm in short axis on image 4 series 3, formerly the same. No pathologic adenopathy in the chest. Lungs/Pleura: Biapical pleuroparenchymal scarring, right greater than left. Stable left upper lobe scarring and peribronchovascular density with wedge resection line in the left upper lobe, no significant contour change of  density in this vicinity to suggest malignancy. Stable 5 by 4 mm left lower lobe nodule and stable 3 by 4 mm right lower lobe nodule, unchanged from at least 2021, compatible with benign etiology. No new lesion identified. Upper Abdomen: 2 mm left kidney upper pole nonobstructive renal calculus, image 43 series 7. Musculoskeletal: Unremarkable IMPRESSION: 1. Stable postoperative findings in the left upper lobe. 2. Stable small bilateral lower lobe pulmonary nodules, unchanged from at least 2021, compatible with benign etiology. 3. 2 mm left kidney upper pole nonobstructive renal calculus. Electronically Signed   By: Gaylyn Rong M.D.   On: 04/15/2023 14:03   CT Soft Tissue Neck W Contrast  Result Date: 04/15/2023 CLINICAL DATA:  Head/neck cancer.  Last chemo 04/02/2023. EXAM: CT NECK WITH CONTRAST TECHNIQUE: Multidetector CT imaging of the neck was performed using the standard protocol following the bolus administration of intravenous contrast. RADIATION DOSE REDUCTION: This exam was performed according to the departmental dose-optimization program which includes automated exposure control, adjustment of the mA and/or kV according to patient size and/or use of iterative reconstruction technique. CONTRAST:  75mL OMNIPAQUE IOHEXOL 300 MG/ML  SOLN COMPARISON:  CT neck 11/27/2022. FINDINGS: Pharynx and larynx: There is probable mucoid debris in the right nasal cavity/nasopharynx. The nasal cavity and nasopharynx are otherwise unremarkable. The oral cavity and oropharynx are unremarkable. The parapharyngeal spaces are clear. The hypopharynx and larynx are unremarkable. The epiglottis is normal. There is no retropharyngeal collection. The airway is patent. Salivary glands: Atrophy of the parotid and left submandibular glands is unchanged. The right submandibular gland is again not identified and may be surgically absent. Thyroid: Unremarkable. Lymph nodes: The infiltrative mass in the right neck is again seen.  The mass invades through the right C2-C3 neural foramen into the epidural space resulting in mild narrowing of the thecal sac without suspected cord compression. The degree of intraspinal epidural tumor appears slightly increased compared to the study from 11/27/2022. Mass again encases the vertebral artery at the C2-C3 level (4-36), unchanged. The remainder of the infiltrative mass in the paraspinal musculature with extension into the overlying skin surface does not appear significantly changed in size or extent compared to the prior study. A 0.9 cm midline level I lymph node is unchanged going back to 2021. Otherwise, there is no new or progressive lymphadenopathy in the neck. Vascular: As above, there is encasement of the right vertebral artery at the C2-C3 level by the right  neck mass. There is mild calcified plaque at the carotid bifurcations. The right IJ is occluded or sacrificed, unchanged. The left IJ is patent. A right chest wall port is partially imaged. Limited intracranial: Unremarkable. Visualized orbits: Unremarkable. Mastoids and visualized paranasal sinuses: There is mild mucosal thickening in the maxillary sinuses. The mastoid air cells and middle ear cavities are clear. Skeleton: There is no acute osseous abnormality or suspicious osseous lesion. Upper chest: Assessed on the separately dictated CT chest. Other: None. IMPRESSION: 1. Infiltrative mass again seen in the right neck centered in the paraspinal musculature with invasion through the right C2-C3 neural foramen. The bulk of the epidural tumor at this level appears slightly increased in size compared to the prior study from 11/27/2022 with probable leftward displacement of the cord but without frank cord compression. The remainder of the mass in the right neck is otherwise not significantly changed. 2. No new or progressive lymphadenopathy in the remainder of the neck. 3. Probable mucoid debris in the right nasal cavity/nasopharynx. No  suspicious mass lesion or enhancement in the nasopharynx. Electronically Signed   By: Lesia Hausen M.D.   On: 04/15/2023 14:02      05/07/2023 - 09/10/2023 Chemotherapy   Patient is on Treatment Plan : HEAD/NECK Cetuximab q14d + Carboplatin + 5FU IVCI D1-4 q21d x 6 cycles / Cetuximab q14d     10/01/2023 -  Chemotherapy   Patient is on Treatment Plan : HEAD/NECK Carboplatin + Paclitaxel q21d     10/04/2023 Imaging   CT Soft Tissue Neck W Contrast Result Date: 10/04/2023 CLINICAL DATA:  Neck mass, nonpulsatile neck swelling EXAM: CT NECK WITH CONTRAST TECHNIQUE: Multidetector CT imaging of the neck was performed using the standard protocol following the bolus administration of intravenous contrast. RADIATION DOSE REDUCTION: This exam was performed according to the departmental dose-optimization program which includes automated exposure control, adjustment of the mA and/or kV according to patient size and/or use of iterative reconstruction technique. CONTRAST:  75mL OMNIPAQUE IOHEXOL 300 MG/ML  SOLN COMPARISON:  CT neck 04/13/2023. FINDINGS: Pharynx and larynx: Unremarkable nasopharynx. Salivary glands: Atrophic.  No evidence of acute abnormality. Thyroid: Normal. Lymph nodes: The infiltrative mass in the right neck is slightly decreased in size/bulk, now measuring approximately 3.7 x 2.8 cm. 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 The tumor extends superficially to the skin surface, similar to prior. Mildly increased central areas of necrosis/cystic change within the mass. Stable midline 9 mm level I lymph node. No new or progressive lymphadenopathy. 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 Calcific atherosclerosis. Limited intracranial: No obvious acute abnormality on limited assessment and partial visualization. Visualized orbits: Negative. Mastoids and visualized paranasal sinuses: Clear.  Skeleton: No acute or aggressive process. Upper chest: Chronic postoperative changes in the left upper lobe, better characterized on prior CT of the chest. IMPRESSION: 1. 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 2. No new or progressive lymphadenopathy. Electronically Signed   By: Feliberto Harts M.D.   On: 10/04/2023 17:15

## 2023-12-03 NOTE — Progress Notes (Signed)
 Nutrition Assessment   Reason for Assessment: RN request   ASSESSMENT: 38 year old male with recurrent neoplasm of nasopharynx. Pt is currently receiving reduced dose carbo/taxol q21d. Patient is under the care of Dr. Bertis Ruddy  Past medical history of poor dentition, gastritis, hypothyroidism, DM2, neuropathy, B12 deficiency, anxiety  Met with patient in infusion at request of RN for poor oral intake secondary to missing back molars. Patient reports crowns fell off of back teeth. States being told he would need to have all of his teeth removed. He is not interested in this. Patient reports having a good appetite but limited to what he can eat. Recalls 4 peanut butter and jam sandwiches everyday. He reports left side neck swelling that gives him great anxiety. States he is very claustrophobic and it feels like he is being physically choked when this happens which is daily. Improves with muscle relaxer overnight as well as steroids which he received today. Patient has tried Ensure in the past. He did not like this and caused nausea. Patient does like milk. Reports consuming whole milk often.   Nutrition Focused Physical Exam:   Orbital Region: moderate Buccal Region: moderate  Upper Arm Region: Air cabin crew and Lumbar Region: uta  Temple Region: moderate Hair: n/a Eyes: reviewed  Mouth: poor dentition; missing teeth Skin: reviewed  Nails: reviewed    Medications: dulcolax, decadron, pepcid, gabapentin, synthroid, ativan, metformin, methadone, zofran, oxycodone, protonix, compazine, zanaflex, synthroid   Labs: glucose 145   Anthropometrics: Weights have decreased 7% (13 lbs) in 8 weeks - severe for time frame  Height: 5'6" Weight: 172 lb 3.2 UBW: 185 lb (10/01/23) BMI: 27.79    NUTRITION DIAGNOSIS: Unintended wt loss related to cancer and treatment related side effects as evidenced by 7% wt loss in 8 weeks which is severe for time frame  MALNUTRITION DIAGNOSIS: Pt meets  criteria for severe malnutrition related to chronic illness (recurrent HNC) moderate fat/muscle depletions, severe 7% wt loss in 2 months  INTERVENTION:  Educated on importance of adequate calories and protein to maintain strength/weights Educated on soft moist high calorie/high protein foods for ease of intake - handout with ideas + shake recipes provided  Suggested trying CIB powder mixed with whole milk for ONS, recommend 2-3/day as tolerated - samples provided Pt politely declined bag of food from [facility] Referral to LCSW    MONITORING, EVALUATION, GOAL: Pt will tolerate increased calories and protein to minimize further wt loss    Next Visit: To be scheduled with upcoming treatment

## 2023-12-03 NOTE — Assessment & Plan Note (Addendum)
This is likely due to recent treatment. The patient denies recent history of bleeding such as epistaxis, hematuria or hematochezia. He is asymptomatic from the anemia. I will observe for now.  He does not require transfusion now. I will continue the chemotherapy at current dose without dosage adjustment.  If the anemia gets progressive worse in the future, I might have to delay his treatment or adjust the chemotherapy dose.  

## 2023-12-03 NOTE — Assessment & Plan Note (Addendum)
 He will continue with reduced dose of paclitaxel along with cryotherapy

## 2023-12-03 NOTE — Assessment & Plan Note (Addendum)
 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

## 2023-12-03 NOTE — Assessment & Plan Note (Addendum)
 His pain control has improved I refilled his oxycodone prescription today

## 2023-12-04 ENCOUNTER — Other Ambulatory Visit (HOSPITAL_COMMUNITY): Payer: Self-pay

## 2023-12-04 ENCOUNTER — Encounter: Payer: Self-pay | Admitting: Hematology and Oncology

## 2023-12-04 ENCOUNTER — Inpatient Hospital Stay: Payer: Medicaid Other | Attending: Hematology and Oncology

## 2023-12-04 VITALS — BP 119/83 | HR 78 | Temp 97.9°F | Resp 16

## 2023-12-04 DIAGNOSIS — C119 Malignant neoplasm of nasopharynx, unspecified: Secondary | ICD-10-CM | POA: Diagnosis present

## 2023-12-04 DIAGNOSIS — C760 Malignant neoplasm of head, face and neck: Secondary | ICD-10-CM

## 2023-12-04 DIAGNOSIS — Z95828 Presence of other vascular implants and grafts: Secondary | ICD-10-CM

## 2023-12-04 DIAGNOSIS — C7800 Secondary malignant neoplasm of unspecified lung: Secondary | ICD-10-CM

## 2023-12-04 MED ORDER — DIPHENHYDRAMINE HCL 50 MG/ML IJ SOLN
25.0000 mg | Freq: Once | INTRAMUSCULAR | Status: AC
  Administered 2023-12-04: 25 mg via INTRAVENOUS
  Filled 2023-12-04: qty 1

## 2023-12-04 MED ORDER — SODIUM CHLORIDE 0.9% FLUSH
10.0000 mL | Freq: Once | INTRAVENOUS | Status: AC
  Administered 2023-12-04: 10 mL

## 2023-12-04 MED ORDER — SODIUM CHLORIDE 0.9 % IV SOLN: Freq: Once | INTRAVENOUS | Status: AC

## 2023-12-06 ENCOUNTER — Other Ambulatory Visit: Payer: Self-pay

## 2023-12-06 ENCOUNTER — Telehealth: Payer: Self-pay

## 2023-12-06 DIAGNOSIS — C760 Malignant neoplasm of head, face and neck: Secondary | ICD-10-CM

## 2023-12-06 DIAGNOSIS — C7800 Secondary malignant neoplasm of unspecified lung: Secondary | ICD-10-CM

## 2023-12-06 NOTE — Telephone Encounter (Signed)
 Called to follow up with him after getting a message from the scheduler. He had a panic attack and just took lorazepam. He is sleepy and will call back to schedule appts. Offered earlier appt with Dr. Bertis Ruddy, he declined appt and will call if appt needed. He is having more panic attacks. He is having swelling in his neck but no more than usual. He is taking his pain medication and more concerned about the swelling in his neck. Denies difficulty breathing. Instructed him to call the office back for concerns and go to the ER for worsening symptoms. He verbalized understanding.  Sent palliative care referral.

## 2023-12-07 ENCOUNTER — Telehealth: Payer: Self-pay | Admitting: Licensed Clinical Social Worker

## 2023-12-07 NOTE — Telephone Encounter (Signed)
 [facility] Clinical Social Work  Clinical Social Work was referred by medical provider for assessment of psychosocial needs (anxiety and dental needs).  Clinical Social Worker contacted patient by phone to offer support and assess for needs.   Patient stated he could not talk at this time. Requested a call back tomorrow around 10am. CSW will call again at that time.     Nachum Derossett E Stpehanie Montroy, LCSW  Clinical Social Worker [facility]        Patient is participating in a Managed Medicaid Plan:  Yes

## 2023-12-08 ENCOUNTER — Telehealth: Payer: Self-pay | Admitting: Licensed Clinical Social Worker

## 2023-12-08 NOTE — Telephone Encounter (Signed)
 [facility] Clinical Social Work  CSW contacted pt by phone at agreed upon time to discuss referral for anxiety. Patient answered but stated he is feeling weak and tired and does not want to talk. CSW offered to have nurse call, but pt declined. Pt stated he thinks anxiety and lack of sleep are impacting him.  CSW offered support surrounding the anxiety and disucssed different options available with counseling here, referral out, and extra support of peer mentor/ support group.  Pt stated he does not want any of those right now.  CSW offered direct contact information for pt to call if he changes his mind and pt requested to be sent through his MyChart. Message sent.   Lounell Schumacher E Jaivyn Gulla, LCSW

## 2023-12-14 ENCOUNTER — Other Ambulatory Visit: Payer: Self-pay

## 2023-12-17 ENCOUNTER — Other Ambulatory Visit: Payer: Self-pay

## 2023-12-24 ENCOUNTER — Other Ambulatory Visit: Payer: Self-pay

## 2023-12-27 ENCOUNTER — Other Ambulatory Visit: Payer: Self-pay

## 2023-12-27 ENCOUNTER — Other Ambulatory Visit: Payer: Self-pay | Admitting: Hematology and Oncology

## 2023-12-27 ENCOUNTER — Telehealth: Payer: Self-pay

## 2023-12-27 ENCOUNTER — Other Ambulatory Visit (HOSPITAL_BASED_OUTPATIENT_CLINIC_OR_DEPARTMENT_OTHER): Payer: Self-pay

## 2023-12-27 MED ORDER — LORAZEPAM 0.5 MG PO TABS
0.5000 mg | ORAL_TABLET | Freq: Two times a day (BID) | ORAL | 0 refills | Status: DC | PRN
  Filled 2023-12-27: qty 60, 30d supply, fill #0

## 2023-12-27 NOTE — Telephone Encounter (Signed)
 Called and told him Rx sent. He verbalized understanding.

## 2023-12-27 NOTE — Telephone Encounter (Signed)
 done

## 2023-12-27 NOTE — Telephone Encounter (Signed)
 He called and left a message requesting Lorazepam refill to [facility] please.

## 2023-12-30 ENCOUNTER — Telehealth: Payer: Self-pay

## 2023-12-30 ENCOUNTER — Other Ambulatory Visit: Payer: Self-pay | Admitting: Hematology and Oncology

## 2023-12-30 ENCOUNTER — Encounter: Payer: Self-pay | Admitting: Hematology and Oncology

## 2023-12-30 ENCOUNTER — Other Ambulatory Visit (HOSPITAL_BASED_OUTPATIENT_CLINIC_OR_DEPARTMENT_OTHER): Payer: Self-pay

## 2023-12-30 MED ORDER — OXYCODONE HCL 10 MG PO TABS
10.0000 mg | ORAL_TABLET | ORAL | 0 refills | Status: DC | PRN
  Filled 2023-12-30: qty 90, 15d supply, fill #0

## 2023-12-30 MED FILL — Fosaprepitant Dimeglumine For IV Infusion 150 MG (Base Eq): INTRAVENOUS | Qty: 5 | Status: AC

## 2023-12-30 NOTE — Telephone Encounter (Signed)
 done

## 2023-12-30 NOTE — Telephone Encounter (Signed)
 Notified Patient of prior authorization approval for Oxycodone HCL 10mg  Tablets. Medication is approved through 06/27/2024. Pharmacy notified. No other needs or concerns noted at this time.

## 2023-12-30 NOTE — Telephone Encounter (Signed)
 He called and left a message requesting Oxycodone to [facility] please.

## 2023-12-30 NOTE — Telephone Encounter (Signed)
Called and told Rx sent to pharmacy. He verbalized understanding. 

## 2023-12-31 ENCOUNTER — Inpatient Hospital Stay

## 2023-12-31 ENCOUNTER — Other Ambulatory Visit (HOSPITAL_BASED_OUTPATIENT_CLINIC_OR_DEPARTMENT_OTHER): Payer: Self-pay

## 2023-12-31 ENCOUNTER — Telehealth: Payer: Self-pay

## 2023-12-31 ENCOUNTER — Inpatient Hospital Stay: Admitting: Hematology and Oncology

## 2023-12-31 NOTE — Telephone Encounter (Signed)
 Called and left a message asking him to call the office. He is late for appts today.

## 2024-01-03 ENCOUNTER — Telehealth: Payer: Self-pay | Admitting: Hematology and Oncology

## 2024-01-04 ENCOUNTER — Inpatient Hospital Stay: Payer: Self-pay

## 2024-01-04 ENCOUNTER — Inpatient Hospital Stay (HOSPITAL_BASED_OUTPATIENT_CLINIC_OR_DEPARTMENT_OTHER): Admitting: Hematology and Oncology

## 2024-01-04 ENCOUNTER — Other Ambulatory Visit (HOSPITAL_COMMUNITY): Payer: Self-pay

## 2024-01-04 ENCOUNTER — Encounter: Payer: Self-pay | Admitting: Nurse Practitioner

## 2024-01-04 ENCOUNTER — Inpatient Hospital Stay: Payer: Self-pay | Attending: Hematology and Oncology | Admitting: Nurse Practitioner

## 2024-01-04 ENCOUNTER — Encounter: Payer: Self-pay | Admitting: Hematology and Oncology

## 2024-01-04 ENCOUNTER — Ambulatory Visit: Admitting: Dietician

## 2024-01-04 VITALS — BP 126/83 | HR 89 | Temp 97.2°F | Resp 17 | Ht 66.0 in | Wt 170.0 lb

## 2024-01-04 VITALS — BP 120/76 | HR 82 | Temp 98.0°F | Resp 18 | Ht 66.0 in | Wt 170.2 lb

## 2024-01-04 DIAGNOSIS — C770 Secondary and unspecified malignant neoplasm of lymph nodes of head, face and neck: Secondary | ICD-10-CM

## 2024-01-04 DIAGNOSIS — T451X5A Adverse effect of antineoplastic and immunosuppressive drugs, initial encounter: Secondary | ICD-10-CM

## 2024-01-04 DIAGNOSIS — F411 Generalized anxiety disorder: Secondary | ICD-10-CM

## 2024-01-04 DIAGNOSIS — D6481 Anemia due to antineoplastic chemotherapy: Secondary | ICD-10-CM

## 2024-01-04 DIAGNOSIS — C119 Malignant neoplasm of nasopharynx, unspecified: Secondary | ICD-10-CM | POA: Insufficient documentation

## 2024-01-04 DIAGNOSIS — Z515 Encounter for palliative care: Secondary | ICD-10-CM

## 2024-01-04 DIAGNOSIS — C760 Malignant neoplasm of head, face and neck: Secondary | ICD-10-CM

## 2024-01-04 DIAGNOSIS — G893 Neoplasm related pain (acute) (chronic): Secondary | ICD-10-CM

## 2024-01-04 DIAGNOSIS — Z7189 Other specified counseling: Secondary | ICD-10-CM

## 2024-01-04 DIAGNOSIS — Z95828 Presence of other vascular implants and grafts: Secondary | ICD-10-CM

## 2024-01-04 DIAGNOSIS — F419 Anxiety disorder, unspecified: Secondary | ICD-10-CM

## 2024-01-04 DIAGNOSIS — C7800 Secondary malignant neoplasm of unspecified lung: Secondary | ICD-10-CM

## 2024-01-04 LAB — CMP (CANCER CENTER ONLY)
ALT: 15 U/L (ref 0–44)
AST: 26 U/L (ref 15–41)
Albumin: 4.7 g/dL (ref 3.5–5.0)
Alkaline Phosphatase: 57 U/L (ref 38–126)
Anion gap: 6 (ref 5–15)
BUN: 15 mg/dL (ref 6–20)
CO2: 28 mmol/L (ref 22–32)
Calcium: 9.7 mg/dL (ref 8.9–10.3)
Chloride: 102 mmol/L (ref 98–111)
Creatinine: 0.84 mg/dL (ref 0.61–1.24)
GFR, Estimated: >60 mL/min (ref >60)
Glucose, Bld: 153 mg/dL — ABNORMAL HIGH (ref 70–99)
Potassium: 3.9 mmol/L (ref 3.5–5.1)
Sodium: 136 mmol/L (ref 135–145)
Total Bilirubin: 0.4 mg/dL (ref 0.0–1.2)
Total Protein: 7.6 g/dL (ref 6.5–8.1)

## 2024-01-04 LAB — CBC WITH DIFFERENTIAL (CANCER CENTER ONLY)
Abs Immature Granulocytes: 0.01 10*3/uL (ref 0.00–0.07)
Basophils Absolute: 0 10*3/uL (ref 0.0–0.1)
Basophils Relative: 0 %
Eosinophils Absolute: 0 10*3/uL (ref 0.0–0.5)
Eosinophils Relative: 0 %
HCT: 35.3 % — ABNORMAL LOW (ref 39.0–52.0)
Hemoglobin: 11.6 g/dL — ABNORMAL LOW (ref 13.0–17.0)
Immature Granulocytes: 0 %
Lymphocytes Relative: 14 %
Lymphs Abs: 0.7 10*3/uL (ref 0.7–4.0)
MCH: 28.6 pg (ref 26.0–34.0)
MCHC: 32.9 g/dL (ref 30.0–36.0)
MCV: 86.9 fL (ref 80.0–100.0)
Monocytes Absolute: 0.2 10*3/uL (ref 0.1–1.0)
Monocytes Relative: 3 %
Neutro Abs: 4.2 10*3/uL (ref 1.7–7.7)
Neutrophils Relative %: 83 %
Platelet Count: 268 10*3/uL (ref 150–400)
RBC: 4.06 MIL/uL — ABNORMAL LOW (ref 4.22–5.81)
RDW: 18.1 % — ABNORMAL HIGH (ref 11.5–15.5)
WBC Count: 5.1 10*3/uL (ref 4.0–10.5)
nRBC: 0 % (ref 0.0–0.2)

## 2024-01-04 MED ORDER — CETIRIZINE HCL 10 MG/ML IV SOLN
10.0000 mg | Freq: Once | INTRAVENOUS | Status: AC
  Administered 2024-01-04: 10 mg via INTRAVENOUS
  Filled 2024-01-04: qty 1

## 2024-01-04 MED ORDER — SODIUM CHLORIDE 0.9 % IV SOLN
112.5000 mg/m2 | Freq: Once | INTRAVENOUS | Status: AC
  Administered 2024-01-04: 216 mg via INTRAVENOUS
  Filled 2024-01-04: qty 36

## 2024-01-04 MED ORDER — SODIUM CHLORIDE 0.9% FLUSH
10.0000 mL | Freq: Once | INTRAVENOUS | Status: AC
  Administered 2024-01-04: 10 mL

## 2024-01-04 MED ORDER — SODIUM CHLORIDE 0.9 % IV SOLN
150.0000 mg | Freq: Once | INTRAVENOUS | Status: AC
  Administered 2024-01-04: 150 mg via INTRAVENOUS
  Filled 2024-01-04: qty 150

## 2024-01-04 MED ORDER — SODIUM CHLORIDE 0.9 % IV SOLN
678.0000 mg | Freq: Once | INTRAVENOUS | Status: AC
  Administered 2024-01-04: 680 mg via INTRAVENOUS
  Filled 2024-01-04: qty 68

## 2024-01-04 MED ORDER — PALONOSETRON HCL INJECTION 0.25 MG/5ML
0.2500 mg | Freq: Once | INTRAVENOUS | Status: AC
  Administered 2024-01-04: 0.25 mg via INTRAVENOUS
  Filled 2024-01-04: qty 5

## 2024-01-04 MED ORDER — DEXAMETHASONE SODIUM PHOSPHATE 10 MG/ML IJ SOLN
10.0000 mg | Freq: Once | INTRAMUSCULAR | Status: AC
  Administered 2024-01-04: 10 mg via INTRAVENOUS
  Filled 2024-01-04: qty 1

## 2024-01-04 MED ORDER — FAMOTIDINE IN NACL 20-0.9 MG/50ML-% IV SOLN
20.0000 mg | Freq: Once | INTRAVENOUS | Status: AC
  Administered 2024-01-04: 20 mg via INTRAVENOUS
  Filled 2024-01-04: qty 50

## 2024-01-04 MED ORDER — SODIUM CHLORIDE 0.9 % IV SOLN: INTRAVENOUS | Status: DC

## 2024-01-04 MED ORDER — DIPHENHYDRAMINE HCL 50 MG/ML IJ SOLN
25.0000 mg | Freq: Once | INTRAMUSCULAR | Status: AC
  Administered 2024-01-04: 25 mg via INTRAVENOUS
  Filled 2024-01-04: qty 1

## 2024-01-04 MED ORDER — VENLAFAXINE HCL ER 37.5 MG PO CP24
37.5000 mg | ORAL_CAPSULE | Freq: Every day | ORAL | 1 refills | Status: DC
  Filled 2024-01-04: qty 30, 30d supply, fill #0

## 2024-01-04 MED ORDER — METHADONE HCL 10 MG PO TABS
10.0000 mg | ORAL_TABLET | Freq: Three times a day (TID) | ORAL | 0 refills | Status: DC
Start: 2024-01-04 — End: 2024-02-21
  Filled 2024-01-04: qty 90, 30d supply, fill #0

## 2024-01-04 MED ORDER — SODIUM CHLORIDE 0.9% FLUSH
10.0000 mL | INTRAVENOUS | Status: DC | PRN
  Administered 2024-01-04: 10 mL

## 2024-01-04 NOTE — Progress Notes (Signed)
 Nutrition Follow-up:  Pt with recurrent neoplasm of nasopharynx. Pt is currently receiving reduced dose carbo/taxol q21d. Patient is under the care of Dr. Bertis Ruddy   Met with pt in infusion. He is drowsy at visit s/p taking anxiety medication. Pt reports appetite is about the same. He is unable to tolerate solid foods secondary to poor dentition. Pt has contacted [facility] and awaiting return call. Pt eating 3-4 peanut butter and jam sandwiches, hard boiled eggs, drinkable yogurt. Had half cup of soup in infusion per RN Lyla Son). Patient continues to drink ~3 cups of whole milk. He did try CIB powder, however he did not like this.   Medications: reviewed   Labs: glucose 153  Anthropometrics: Wt 170 lb 3.2 oz today   2/28 - 172 lb 3.2 oz  1/17 - 183 lb 3.2 oz   7% wt loss in 10 weeks - severe for time frame  NUTRITION DIAGNOSIS: Unintended wt loss continues   MALNUTRITION DIAGNOSIS: Severe malnutrition continues   INTERVENTION:  Suggested trying chobani [facility] yogurt drink - pt able to pull this up on [employer] and agrees to try this Continue encouraging soft moist textures, choosing high calorie high protein foods Continue drinking 3 cups whole fat milk     MONITORING, EVALUATION, GOAL: wt trends, intake   NEXT VISIT: To be scheduled

## 2024-01-04 NOTE — Progress Notes (Signed)
 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 but with no residual signs of disease in his lungs on recent imaging studies Typically, he tends to relapse on the right side of his neck Previous treatments included on Nivolumab, pembrolizumab, gemzar, cisplatin and toripalimab, cetuximab   The patient has excellent response to current chemotherapy with carboplatin and paclitaxel Due to side effects, I recommend spacing out his treatment to every 4 weeks We will proceed with treatment today without delay We will provide additional supportive care with IV fluid support per patient request Cancer associated pain His pain control is stable I refilled his prescription of methadone today Anxiety, generalized He has profound anxiety He is willing to try low-dose antidepressant Due to interaction between methadone and citalopram, I switched him to Effexor instead Anemia due to antineoplastic chemotherapy This is likely due to recent treatment. The patient denies recent history of bleeding such as epistaxis, hematuria or hematochezia. He is asymptomatic from the anemia. I will observe for now.  He does not require transfusion now. I will continue the chemotherapy at current dose without dosage adjustment.  If the anemia gets progressive worse in the future, I might have to delay his treatment or adjust the chemotherapy dose.   No orders of the defined types were placed in  this encounter.    Artis Delay, MD  INTERVAL HISTORY: he returns for treatment follow-up Complications related to previous cycle of chemotherapy included anemia,, cancer associated pain,, and severe anxiety He missed his appointment last week He is incredibly anxious and tearful today He saw palliative care this morning who recommended him to try low-dose antidepressant but he is concerned about side effects His pain is well-controlled There is no growth on the right side of his neck He denies worsening peripheral neuropathy He requested additional IV fluid support  PHYSICAL EXAMINATION: ECOG PERFORMANCE STATUS: 1 - Symptomatic but completely ambulatory  Vitals:   01/04/24 1004  BP: 120/76  Pulse: 82  Resp: 18  Temp: 98 F (36.7 C)  SpO2: 100%   Filed Weights   01/04/24 1004  Weight: 170 lb 3.2 oz (77.2 kg)    Relevant data reviewed during this visit included CBC and CMP

## 2024-01-04 NOTE — Assessment & Plan Note (Addendum)
 His pain control is stable I refilled his prescription of methadone today

## 2024-01-04 NOTE — Progress Notes (Signed)
 Palliative Medicine [facility]  [phone] [phone]   Name: Christian Simmons Date: 01/04/2024 MRN: [id number]  DOB: [birth year]  Patient Care Team: Artis Delay, MD as PCP - General (Hematology and Oncology) Artis Delay, MD as Consulting Physician (Hematology and Oncology) Elfredia Nevins, MD as Referring Physician (Plastic Surgery) Hilarie Fredrickson, MD as Consulting Physician (Gastroenterology)    REASON FOR CONSULTATION: Christian Simmons is a 38 y.o. 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  Palliative is seeing patient for symptom management and goals of care.    SOCIAL HISTORY:     reports that he has never smoked. He has never used smokeless tobacco. He reports that he does not drink alcohol and does not use drugs.  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    PAST SURGICAL HISTORY:  Past Surgical History:  Procedure Laterality Date   IR CV LINE INJECTION  10/09/2022   LUNG REMOVAL, PARTIAL  05/03/2012   left upper lobectomy   nasal biopsy     RADICAL NECK DISSECTION     VIDEO BRONCHOSCOPY N/A 06/29/2014   Procedure: VIDEO BRONCHOSCOPY ;  Surgeon: Loreli Slot, MD;  Location: [facility] OR;  Service: Thoracic;  Laterality: N/A;    HEMATOLOGY/ONCOLOGY HISTORY:  Oncology History Overview Note  Nasopharyngeal cancer    Primary site: Pharynx - Nasopharynx   Staging method: AJCC 7th Edition   Clinical: Stage IVC (T3, N2, M1) signed by Artis Delay, MD on 06/03/2014 10:08 PM   Summary: Stage IVC (T3, N2, M1) He was diagnosed in [place] and received treatment in [place] and [place]. 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 Pan-endoscopy elsewhere showed cancer from nasopharyngeal space.   01/04/2007 - 02/20/2007 Chemotherapy   He received 2 cycles of cisplatin and 5FU followed by concurrent chemo with weekly cisplatin and radiation. He only received 2 doses of chemo due to severe mucositis, nausea and weight loss.   04/05/2007 - 08/04/2007 Chemotherapy   He received 4 more courses of cisplatin with 5FU and had complete response   07/05/2009 Procedure   Fine-needle aspirate of the right level II lymph nodes come from recurrent metastatic disease. Repeat endoscopy and CT scan show no evidence of disease elsewhere.   07/08/2009 - 12/02/2009 Chemotherapy   He was given 6 cycles of carboplatin, 5-FU and docetaxel   12/03/2009 Surgery   He has surgery to the residual lymph node on the right neck which showed no evidence of disease.   02/22/2012 Imaging   Repeat imaging study showed large recurrent mass. He was referred elsewhere for further treatment.  05/03/2012 Surgery   He underwent left upper lobectomy.   04/29/2013 Imaging   PEt scan showed lesion on right level II B and lower lung was abnormal   06/03/2013 - 02/02/2014 Chemotherapy   He had 6 cycles of chemotherapy when he was found to have recurrence of cancer and had received oxaliplatin and capecitabine   06/07/2014 Imaging   PET CT scan showed persistent disease in the right neck lymph nodes and left lung   06/29/2014 Procedure    Accession: [id number] repeat LUL biopsy confirmed metastatic cancer   07/18/2014 - 07/31/2014 Radiation Therapy   He received palliative radiation therapy to the lungs   10/10/2014 Imaging   CT scan of the chest, abdomen and pelvis show regression in the size of the lung nodule in the left upper lobe and stable pulmonary nodules   01/24/2015 Imaging   CT scan showed stable disease in neck and lung   06/19/2015 Imaging   CT scan of the neck and the chest show possible mild progression of the nodule in the right side of the neck.   06/25/2015 Imaging   PET scan confirmed disease recurrence in the neck   07/07/2015 Imaging   He had MRI neck at [facility]   09/03/2015 - 08/26/2018 Chemotherapy   He received palliative chemo with Nivolumab   10/29/2015 Imaging   PET CT showed positive response to Rx   02/28/2016 Imaging   Ct abdomen showed abnormal thinkening in his stomach   03/03/2016 Imaging   CT: Right sternocleidomastoid muscle metastasis appears less distinct but otherwise not significantly changed in size or configuration since 06/19/2015.2. Left level 3 lymph node which was hypermetabolic by PET-CT in January 2017 appears slightly smaller   04/01/2016 Imaging   CT cervical spine showed no acute fracture or traumatic malalignment in the cervical spine   04/22/2016 Procedure   Port-a-cath placed.   06/16/2016 Imaging   Ct neck showed right sternocleidomastoid muscle metastasis is further decreased in conspicuity since May, and has mildly decreased in size since September 2016. Continued stability of sub-centimeter left cervical lymph nodes. No new or progressive metastatic disease in the neck.   06/16/2016 Imaging   CT chest showed stable masslike radiation fibrosis in the left upper lobe. Stable subcentimeter pulmonary nodules in the bilateral lower lobes. No new or progressive metastatic disease in the chest. Nonobstructing left renal stone.   10/13/2016 Imaging   Ct neck showed unchanged  right sternocleidomastoid muscle metastasis. Unchanged subcentimeter left cervical lymph nodes. No evidence of new or progressive metastatic disease in the neck.   10/13/2016 Imaging   CT chest showed tiny hypervascular foci in the liver, not definitely seen on prior imaging of 06/16/2016 and 02/28/2016. Abdomen MRI without and with contrast recommended to further evaluate as metastatic disease is a concern. 2. Stable appearance of post treatment changes left upper lung and scattered tiny bilateral pulmonary nodules.   02/11/2017 Imaging   Ct neck: Lymph node mass right posterior neck appears improved from the prior study. Small posterior lymph nodes on the left unchanged. Occluded right jugular vein unchanged.   02/11/2017 Imaging   1. Similar appearance of postsurgical and radiation changes in the left upper lobe. 2. Similar bilateral pulmonary nodules. 3. No thoracic adenopathy. 4. Subtle foci of post-contrast enhancement within the liver are suboptimally characterized on this nondedicated study. Likely similar. These could either be re-evaluated at followup or more entirely characterized with abdominal MRI. 5. Left nephrolithiasis.   05/19/2017 Imaging   Matted  lymph node mass right posterior neck appears larger in the recent CT. Accurate measurements difficult due to infiltrating tumor margins and infiltration of the muscle. Right jugular vein again appears occluded or resected. Small left posterior lymph nodes stable. Left upper lobe airspace density stable and similar to the prior CT   06/03/2017 PET scan   1. Hypermetabolic ill-defined right level IIb lymph node, about 1.3 cm in diameter with maximum SUV 9.5 (formerly 8.1). Appearance suspicious for residual/recurrent malignancy. No worrisome left-sided lesion. 2. Left suprahilar indistinct opacity demonstrates no worrisome hypermetabolic activity. The 5 mm left lower lobe pulmonary nodule is stable and not currently hypermetabolic although  below sensitive PET-CT size thresholds. 3. Other imaging findings of potential clinical significance: Bilateral nonobstructive nephrolithiasis. Chronic bilateral maxillary sinusitis.   05/11/2018 PET scan   1. Continued chronic accentuated metabolic activity in the vicinity of right level IIB and the adjacent right sternocleidomastoid muscle, with ill definition of surrounding tissue planes. Maximum SUV is currently 8.1, formerly 9.5. Accentuated metabolic activity is been present in this vicinity back through 06/25/2015, and there was also some low-level activity in this vicinity on 06/07/2014. Some of this may be from scarring and local muscular activity although clearly a component of residual tumor is difficult to exclude given the focally high activity. 2. Other imaging findings of potential clinical significance: Chronic bilateral maxillary sinusitis. Chronic scarring in the left upper lobe. Chronically stable 5 mm left lower lobe nodule is considered benign. Nonobstructive left nephrolithiasis.   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 COMMENT:No significant keratinization is identified. Other basaloid carcinomas are in the differential diagnosis. No cell block material is available for further testing.   09/12/2018 Procedure   He underwent fine Needle Aspiration   10/04/2018 PET scan   1. Significant progression of local recurrence laterally in the mid right neck with an enlarging, increasingly hypermetabolic soft tissue mass. This involves the right sternocleidomastoid muscle. 2. Small lymph nodes in the right axilla are increasingly hypermetabolic. These are nonspecific and potentially reactive, although could reflect a small metastases. Small hypermetabolic nodule in the left suprasternal notch is unchanged. 3. No other evidence of  metastatic disease.     10/07/2018 - 12/23/2018 Chemotherapy   The patient had cisplatin plus gemzar   12/07/2018 Imaging   1. Decreased size of lateral right neck mass. 2. Unchanged soft tissue nodule in the suprasternal notch. 3. No evidence of new metastatic disease in the neck.     05/30/2019 Imaging   CT neck No clear change or progression compared to the study of March. Overall measurements of the right lateral neck mass are similar, approximately 3 x 1.8 cm. See above discussion. One could argue that there is slight increase in lateral bulging, possibly with an increase in contrast enhancement, towards the inferior margin. This is of questionable validity but could possibly represent some progression or inflammatory change. Other findings in the region are stable.   07/20/2019 - 09/15/2019 Chemotherapy   The patient had gemzar and cisplatin   10/02/2019 Imaging   CT neck As compared to 05/30/2019, no significant interval change in size of an ill-defined mass within the right lateral neck, again measuring 3.3 x 1.8 cm in transaxial dimensions.   Unchanged mildly enlarged left level I lymph node measuring 1.1 cm in short axis.   Unchanged node or nodule at  the thoracic inlet, measuring 1.3 x 0.8 cm.   Please refer to concurrently performed chest CT for a description of findings below the level of the thoracic inlet.     10/02/2019 Imaging   CT chest 1. No new or progressive findings in the chest to suggest metastatic disease. 2. Bilateral subcentimeter solid pulmonary nodules are stable since 2018. 3. Hyperdense 1.1 cm anterior liver focus, not clearly visualized on prior studies. Suggest MRI abdomen without and with IV contrast for further characterization.   10/20/2019 - 12/29/2019 Chemotherapy   The patient had gemzar maintenance   06/12/2020 Imaging   1. Enlarging superficial, exophytic component of the chronic right sternocleidomastoid muscle mass. See series 6, image 55. 2.  Elsewhere stable CT appearance of the Neck.   06/12/2020 Imaging   Post treatment scarring in the left hemithorax, stable. No evidence recurrent or metastatic disease   06/14/2020 - 10/15/2020 Chemotherapy   He received carboplatin, 5FU and Rande Lawman       10/31/2020 Procedure   Interval improvement in right lateral lymph node mass. Improvement in dermal component as well as invasion of the right sternocleidomastoid muscle.   10 mm submental lymph node slightly enlarged compared to the prior study. Continued follow-up recommended.   11/01/2020 - 05/22/2022 Chemotherapy   Patient is on Treatment Plan : HEAD/NECK Pembrolizumab Q21D     07/03/2022 - 10/30/2022 Chemotherapy   Patient is on Treatment Plan : Head and neck Pembrolizumab (400) q42d     11/30/2022 Imaging   CT chest  1. Stable exam. No new or progressive findings to suggest recurrent or metastatic disease. 2. Stable tiny bilateral nodules since 06/12/2020, consistent with benign etiology. 3. Punctate nonobstructing left renal stone.   12/01/2022 Imaging   CT neck  Growing right neck mass since 2022 with epidural tumor extension via the right C2-3 foramen. Cervical MRI with contrast would be contributory.   12/11/2022 - 04/02/2023 Chemotherapy   Patient is on Treatment Plan : HEAD/NECK toripalimab-tpzi D1 + cisplatin D1 + gemcitabine D1,8 q21d x 6 cycles / toripalimab-tpzi q21d (up to 24m)     04/15/2023 Imaging   CT Chest W Contrast  Result Date: 04/15/2023 CLINICAL DATA:  Right neck mass, nasopharyngeal carcinoma EXAM: CT CHEST WITH CONTRAST TECHNIQUE: Multidetector CT imaging of the chest was performed during intravenous contrast administration. RADIATION DOSE REDUCTION: This exam was performed according to the departmental dose-optimization program which includes automated exposure control, adjustment of the mA and/or kV according to patient size and/or use of iterative reconstruction technique. CONTRAST:  75mL OMNIPAQUE IOHEXOL  300 MG/ML  SOLN COMPARISON:  11/27/2022 FINDINGS: Cardiovascular: Right Port-A-Cath tip: Right atrium. Mediastinum/Nodes: Small left supraclavicular lymph node 0.5 cm in short axis on image 4 series 3, formerly the same. No pathologic adenopathy in the chest. Lungs/Pleura: Biapical pleuroparenchymal scarring, right greater than left. Stable left upper lobe scarring and peribronchovascular density with wedge resection line in the left upper lobe, no significant contour change of density in this vicinity to suggest malignancy. Stable 5 by 4 mm left lower lobe nodule and stable 3 by 4 mm right lower lobe nodule, unchanged from at least 2021, compatible with benign etiology. No new lesion identified. Upper Abdomen: 2 mm left kidney upper pole nonobstructive renal calculus, image 43 series 7. Musculoskeletal: Unremarkable IMPRESSION: 1. Stable postoperative findings in the left upper lobe. 2. Stable small bilateral lower lobe pulmonary nodules, unchanged from at least 2021, compatible with benign etiology. 3. 2 mm left kidney upper pole nonobstructive  renal calculus. Electronically Signed   By: Gaylyn Rong M.D.   On: 04/15/2023 14:03   CT Soft Tissue Neck W Contrast  Result Date: 04/15/2023 CLINICAL DATA:  Head/neck cancer.  Last chemo 04/02/2023. EXAM: CT NECK WITH CONTRAST TECHNIQUE: Multidetector CT imaging of the neck was performed using the standard protocol following the bolus administration of intravenous contrast. RADIATION DOSE REDUCTION: This exam was performed according to the departmental dose-optimization program which includes automated exposure control, adjustment of the mA and/or kV according to patient size and/or use of iterative reconstruction technique. CONTRAST:  75mL OMNIPAQUE IOHEXOL 300 MG/ML  SOLN COMPARISON:  CT neck 11/27/2022. FINDINGS: Pharynx and larynx: There is probable mucoid debris in the right nasal cavity/nasopharynx. The nasal cavity and nasopharynx are otherwise  unremarkable. The oral cavity and oropharynx are unremarkable. The parapharyngeal spaces are clear. The hypopharynx and larynx are unremarkable. The epiglottis is normal. There is no retropharyngeal collection. The airway is patent. Salivary glands: Atrophy of the parotid and left submandibular glands is unchanged. The right submandibular gland is again not identified and may be surgically absent. Thyroid: Unremarkable. Lymph nodes: The infiltrative mass in the right neck is again seen. The mass invades through the right C2-C3 neural foramen into the epidural space resulting in mild narrowing of the thecal sac without suspected cord compression. The degree of intraspinal epidural tumor appears slightly increased compared to the study from 11/27/2022. Mass again encases the vertebral artery at the C2-C3 level (4-36), unchanged. The remainder of the infiltrative mass in the paraspinal musculature with extension into the overlying skin surface does not appear significantly changed in size or extent compared to the prior study. A 0.9 cm midline level I lymph node is unchanged going back to 2021. Otherwise, there is no new or progressive lymphadenopathy in the neck. Vascular: As above, there is encasement of the right vertebral artery at the C2-C3 level by the right neck mass. There is mild calcified plaque at the carotid bifurcations. The right IJ is occluded or sacrificed, unchanged. The left IJ is patent. A right chest wall port is partially imaged. Limited intracranial: Unremarkable. Visualized orbits: Unremarkable. Mastoids and visualized paranasal sinuses: There is mild mucosal thickening in the maxillary sinuses. The mastoid air cells and middle ear cavities are clear. Skeleton: There is no acute osseous abnormality or suspicious osseous lesion. Upper chest: Assessed on the separately dictated CT chest. Other: None. IMPRESSION: 1. Infiltrative mass again seen in the right neck centered in the paraspinal  musculature with invasion through the right C2-C3 neural foramen. The bulk of the epidural tumor at this level appears slightly increased in size compared to the prior study from 11/27/2022 with probable leftward displacement of the cord but without frank cord compression. The remainder of the mass in the right neck is otherwise not significantly changed. 2. No new or progressive lymphadenopathy in the remainder of the neck. 3. Probable mucoid debris in the right nasal cavity/nasopharynx. No suspicious mass lesion or enhancement in the nasopharynx. Electronically Signed   By: Lesia Hausen M.D.   On: 04/15/2023 14:02      05/07/2023 - 09/10/2023 Chemotherapy   Patient is on Treatment Plan : HEAD/NECK Cetuximab q14d + Carboplatin + 5FU IVCI D1-4 q21d x 6 cycles / Cetuximab q14d     10/01/2023 -  Chemotherapy   Patient is on Treatment Plan : HEAD/NECK Carboplatin + Paclitaxel q21d     10/04/2023 Imaging   CT Soft Tissue Neck W Contrast Result Date: 10/04/2023 CLINICAL  DATA:  Neck mass, nonpulsatile neck swelling EXAM: CT NECK WITH CONTRAST TECHNIQUE: Multidetector CT imaging of the neck was performed using the standard protocol following the bolus administration of intravenous contrast. RADIATION DOSE REDUCTION: This exam was performed according to the departmental dose-optimization program which includes automated exposure control, adjustment of the mA and/or kV according to patient size and/or use of iterative reconstruction technique. CONTRAST:  75mL OMNIPAQUE IOHEXOL 300 MG/ML  SOLN COMPARISON:  CT neck 04/13/2023. FINDINGS: Pharynx and larynx: Unremarkable nasopharynx. Salivary glands: Atrophic.  No evidence of acute abnormality. Thyroid: Normal. Lymph nodes: The infiltrative mass in the right neck is slightly decreased in size/bulk, now measuring approximately 3.7 x 2.8 cm. 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 The tumor  extends superficially to the skin surface, similar to prior. Mildly increased central areas of necrosis/cystic change within the mass. Stable midline 9 mm level I lymph node. No new or progressive lymphadenopathy. 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 Calcific atherosclerosis. Limited intracranial: No obvious acute abnormality on limited assessment and partial visualization. Visualized orbits: Negative. Mastoids and visualized paranasal sinuses: Clear. Skeleton: No acute or aggressive process. Upper chest: Chronic postoperative changes in the left upper lobe, better characterized on prior CT of the chest. IMPRESSION: 1. 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 2. No new or progressive lymphadenopathy. Electronically Signed   By: Feliberto Harts M.D.   On: 10/04/2023 17:15        ALLERGIES:  is allergic to phenergan [promethazine hcl], heparin, and clindamycin.  MEDICATIONS:  Current Outpatient Medications  Medication Sig Dispense Refill   bisacodyl (DULCOLAX) 10 MG suppository Place 1 suppository (10 mg total) rectally as needed for moderate constipation. 4 suppository 0   blood glucose meter kit and supplies KIT Dispense based on patient and insurance preference. Use up to four times daily as directed. (FOR ICD-9 250.00, 250.01). 1 each 1   cetirizine (ZYRTEC) 10 MG tablet Take 1 tablet (10 mg total) by mouth daily as needed for allergies. 100 tablet 0   dexamethasone (DECADRON) 4 MG tablet Take 2 tablets by mouth the night before and 2 tablets the morning of chemotherapy, every 3 weeks x 6 cycles 24 tablet 6   famotidine (PEPCID) 40 MG tablet Take 1 tablet (40 mg total) by mouth at bedtime. 30 tablet 3   gabapentin (NEURONTIN) 100 MG capsule Take 2 capsules (200 mg total) by mouth 2 (two) times daily. 120 capsule 3   levothyroxine (SYNTHROID) 175 MCG tablet Take 1 tablet  (175 mcg total) by mouth daily before breakfast. 30 tablet 3   LORazepam (ATIVAN) 0.5 MG tablet Take 1 tablet (0.5 mg total) by mouth 2 (two) times daily as needed for anxiety. 60 tablet 0   metFORMIN (GLUCOPHAGE) 500 MG tablet Take 1 tablet (500 mg total) by mouth 2 (two) times daily with a meal. 60 tablet 2   methadone (DOLOPHINE) 10 MG tablet Take 1 tablet (10 mg) by mouth every 8 hours. 90 tablet 0   ondansetron (ZOFRAN) 8 MG tablet Take 1 tablet (8 mg total) by mouth every 8 (eight) hours as needed for nausea or vomiting. 90 tablet 1   Oxycodone HCl 10 MG TABS Take 1 tablet (10 mg total) by mouth every 4 (four) hours as needed. 90 tablet 0   pantoprazole (PROTONIX) 40 MG  tablet Take 1 tablet (40 mg total) by mouth daily. 30 tablet 2   polyethylene glycol (MIRALAX / GLYCOLAX) 17 g packet Take 17 g by mouth 2 (two) times daily. 30 each 3   prochlorperazine (COMPAZINE) 10 MG tablet Take 1 tablet (10 mg total) by mouth every 6 (six) hours as needed for nausea or vomiting. 30 tablet 2   senna-docusate (SENOKOT-S) 8.6-50 MG tablet Take 2 tablets by mouth 2 (two) times daily. 100 tablet 3   tiZANidine (ZANAFLEX) 2 MG tablet Take 1 tablet (2 mg total) by mouth every 6 (six) hours as needed for muscle spasms. 60 tablet 2   No current facility-administered medications for this visit.    VITAL SIGNS: BP 126/83 (BP Location: Left Arm, Patient Position: Sitting)   Pulse 89   Temp (!) 97.2 F (36.2 C) (Temporal)   Resp 17   Ht 5\' 6"  (1.676 m)   Wt 170 lb (77.1 kg)   SpO2 100%   BMI 27.44 kg/m  Filed Weights   01/04/24 0858  Weight: 170 lb (77.1 kg)    Estimated body mass index is 27.44 kg/m as calculated from the following:   Height as of this encounter: 5\' 6"  (1.676 m).   Weight as of this encounter: 170 lb (77.1 kg).  LABS: CBC:    Component Value Date/Time   WBC 5.8 12/03/2023 1100   WBC 8.1 10/04/2023 1554   HGB 11.9 (L) 12/03/2023 1100   HGB 14.1 09/22/2017 0829   HCT 37.2 (L)  12/03/2023 1100   HCT 41.2 09/22/2017 0829   PLT 276 12/03/2023 1100   PLT 176 09/22/2017 0829   MCV 86.9 12/03/2023 1100   MCV 90.2 09/22/2017 0829   NEUTROABS 4.9 12/03/2023 1100   NEUTROABS 3.0 09/22/2017 0829   LYMPHSABS 0.6 (L) 12/03/2023 1100   LYMPHSABS 1.4 09/22/2017 0829   MONOABS 0.2 12/03/2023 1100   MONOABS 0.6 09/22/2017 0829   EOSABS 0.0 12/03/2023 1100   EOSABS 0.1 09/22/2017 0829   BASOSABS 0.0 12/03/2023 1100   BASOSABS 0.0 09/22/2017 0829   Comprehensive Metabolic Panel:    Component Value Date/Time   NA 138 12/03/2023 1100   NA 139 09/22/2017 0829   K 3.7 12/03/2023 1100   K 3.5 09/22/2017 0829   CL 101 12/03/2023 1100   CO2 29 12/03/2023 1100   CO2 26 09/22/2017 0829   BUN 15 12/03/2023 1100   BUN 14.1 09/22/2017 0829   CREATININE 0.84 12/03/2023 1100   CREATININE 0.9 09/22/2017 0829   GLUCOSE 145 (H) 12/03/2023 1100   GLUCOSE 133 09/22/2017 0829   CALCIUM 10.3 12/03/2023 1100   CALCIUM 9.1 09/22/2017 0829   AST 21 12/03/2023 1100   AST 22 09/22/2017 0829   ALT 18 12/03/2023 1100   ALT 30 09/22/2017 0829   ALKPHOS 70 12/03/2023 1100   ALKPHOS 55 09/22/2017 0829   BILITOT 0.5 12/03/2023 1100   BILITOT 0.35 09/22/2017 0829   PROT 8.5 (H) 12/03/2023 1100   PROT 6.8 09/22/2017 0829   ALBUMIN 5.2 (H) 12/03/2023 1100   ALBUMIN 4.1 09/22/2017 0829    RADIOGRAPHIC STUDIES: No results found.  PERFORMANCE STATUS (ECOG) : 1 - Symptomatic but completely ambulatory  Review of Systems  Constitutional:  Positive for appetite change and fatigue.  Psychiatric/Behavioral:  The patient is nervous/anxious.   Unless otherwise noted, a complete review of systems is negative.  Physical Exam General: NAD HEENT: left neck tenderness, right neck scarring, chemo induced alopecia Cardiovascular: regular rate  and rhythm Pulmonary: clear ant fields Abdomen: soft, nontender, + bowel sounds Extremities: no edema, no joint deformities Skin: no  rashes Neurological: Alert and oriented x3  IMPRESSION: Discussed the use of AI scribe software for clinical note transcription with the patient, who gave verbal consent to proceed.  History of Present Illness Stephenson Garringer is a 38 year old male with metastatic recurrent heand and neck cancer who presents for his initial palliative visit. He is complaining of neck swelling and anxiety. Patient is pacing the floor. Anxious appearing. No family present. He is alert and able to engage appropriately in discussions.   I introduced myself, [place]gan RN, and Palliative's role in collaboration with the oncology team. Concept of Palliative Care was introduced as specialized medical care for people and their families living with serious illness.  It focuses on providing relief from the symptoms and stress of a serious illness.  The goal is to improve quality of life for both the patient and the family. Values and goals of care important to patient and family were attempted to be elicited.   He lives with his wife and three children ages 51,8,10. He is not currently working and sometimes drives [employer] to pass the time. No nausea or vomiting. He reports difficulty swallowing large pills, often needing to break them down to avoid choking. Similar experience with dry foods.   At home Mr. Minnie is able to perform all ADLs independently with some limitations due to fatigue, anxiety, and pain. He reports difficulty chewing due to a 'cringy' neck and has a poor appetite, although his weight has remained stable at around 170 pounds down from 179lbs on 2/7. Some days are better than others. He also notes dental cavities that interfere with his eating/chewing. Inquires about use of retainer or some form of teeth covering to allow for better dental support. Advised him of the need to follow-up with dentist for more specific support, which he states he prefers not to do after previous visit leading to recommendations for  multiple extractions.   He has neck swelling on the left side, which he reports began approximately two days after starting chemotherapy with Taxol and has persisted. The tightness sensation he feels at his neck is described as feeling "like being choked." He mentions a previous fall where he hit his left shoulder, causing pain for a few days, but is unsure if it is related to the swelling. His left shoulder/arm pain has since resolved.   For pain management, he takes methadone 10 mg every eight hours and oxycodone 10 mg every four hours as needed, up to four times a day.  Gabapentin 200 mg twice daily.  Tizanidine 2 mg as needed for spasms.  He also uses Miralax daily to manage constipation, occasionally alternating with Senokot.  The patient reports his pain is well-controlled on current regimen.  States he tries to take as least amount of least amount of medications possible out of fear of developing a dependency.  I created space and opportunity for Mr. Lassen to express his thoughts and feelings regarding his current illness and perceived quality of life. Keegen sates experiences constant anxiety, which he attributes to the neck swelling and the sensation of his throat closing. This anxiety interrupts his quality of life. He becomes tearful in discussion. States his kids notices his anxiety and that he is unable to function normally due to this. He expresses if the anxiety and "feeling of throat closing" was to go away he would feel  ok. Of note patient paces back and forth in the room during discussions. He has also requested door be opened as his anxiety also is exacerbated by being in tight spaces. He takes Ativan 0.5 mg twice a day as needed, which sometimes helps. He is concerned about becoming addicted to medications, referencing a past experience experiencing withdrawals.   I empathetically approached discussions regarding patient's anxiety. He denies any other factors that may be contributing  his feelings including recurrent cancer. States he can deal with the cancer diagnosis again referencing he only wishes to focus on his neck swelling and feelings of throat tightness. On exam he is able to swallow without difficulty, observed drinking water. Extensive education provided around use of Lexapro to also assist with his anxiety symptoms in addition to other medication regimen. I discussed used, efficacy, potential side effects. He is hesitant to begin any new medications however has requested information to review prior to making final decision. I have provided him with written literature on anxiety and Lexapro. He expressed appreciation and understanding. Patient to notify team if he wishes to proceed with use at anytime.   All questions answered and support provided.   I discussed the importance of continued conversation with family and their medical providers regarding overall plan of care and treatment options, ensuring decisions are within the context of the patients values and GOCs.  Assessment & Plan Established therapeutic relationship. Education provided on palliative's role in collaboration with their Oncology/Radiation team.  Neck swelling Persistent left-sided neck swelling per patient, associated with a choking sensation and anxiety.  - Defer any concerns for neck abnormalities to Dr. Bertis Ruddy.   Cancer-related pain Chronic pain managed with methadone 10 mg every 8 hours and oxycodone 10 mg every 4 hours as needed. He takes oxycodone three to four times daily. Current regimen effectively manages pain. Dental issues cause chewing difficulty, affecting appetite. - Continue methadone 10 mg every 8 hours as managed by Dr. Bertis Ruddy.  - Continue oxycodone 10 mg every 4 hours as needed managed by Dr. Bertis Ruddy.  - Continue Miralax once daily for constipation management - Consider dental consultation for bite wing to aid in chewing as desired.   Anxiety Anxiety exacerbated by neck  swelling and claustrophobia. Currently on Ativan 0.5 mg twice daily as needed, with partial relief. Concerned about medication addiction and hesitant to start new medications. Lexapro discussed as a potential treatment, with side effects. Prefers to avoid medications if possible however wishes to gain better management and will consider.  - Provide written information on Lexapro and anxiety for review - Discuss the option of starting Lexapro if interested  I will plan to see patient back in 2-3 weeks. Sooner if needed.   Patient expressed understanding and was in agreement with this plan. He also understands that He can call the clinic at any time with any questions, concerns, or complaints.   Thank you for your referral and allowing Palliative to assist in Mr. Ettore Kush's care.   Number and complexity of problems addressed: HIGH - 1 or more chronic illnesses with SEVERE exacerbation, progression, or side effects of treatment - advanced cancer, pain. Any controlled substances utilized were prescribed in the context of palliative care.  Visit consisted of counseling and education dealing with the complex and emotionally intense issues of symptom management and palliative care in the setting of serious and potentially life-threatening illness.  Signed by: Willette Alma, AGPCNP-BC Palliative Medicine Team/[facility]

## 2024-01-04 NOTE — Assessment & Plan Note (Addendum)
 The patient had recurrent head and neck cancer for almost 20 years, with history of metastatic disease to the lungs but with no residual signs of disease in his lungs on recent imaging studies Typically, he tends to relapse on the right side of his neck Previous treatments included on Nivolumab, pembrolizumab, gemzar, cisplatin and toripalimab, cetuximab   The patient has excellent response to current chemotherapy with carboplatin and paclitaxel Due to side effects, I recommend spacing out his treatment to every 4 weeks We will proceed with treatment today without delay We will provide additional supportive care with IV fluid support per patient request

## 2024-01-04 NOTE — Assessment & Plan Note (Addendum)
This is likely due to recent treatment. The patient denies recent history of bleeding such as epistaxis, hematuria or hematochezia. He is asymptomatic from the anemia. I will observe for now.  He does not require transfusion now. I will continue the chemotherapy at current dose without dosage adjustment.  If the anemia gets progressive worse in the future, I might have to delay his treatment or adjust the chemotherapy dose.  

## 2024-01-04 NOTE — Assessment & Plan Note (Addendum)
 He has profound anxiety He is willing to try low-dose antidepressant Due to interaction between methadone and citalopram, I switched him to Effexor instead

## 2024-01-04 NOTE — Patient Instructions (Signed)
 [facility] - A DEPT OF [facility]  Discharge Instructions: Thank you for choosing [facility] to provide your oncology and hematology care.   If you have a lab appointment with the Cancer Center, please go directly to the Cancer Center and check in at the registration area.   Wear comfortable clothing and clothing appropriate for easy access to any Portacath or PICC line.   We strive to give you quality time with your provider. You may need to reschedule your appointment if you arrive late (15 or more minutes).  Arriving late affects you and other patients whose appointments are after yours.  Also, if you miss three or more appointments without notifying the office, you may be dismissed from the clinic at the provider's discretion.      For prescription refill requests, have your pharmacy contact our office and allow 72 hours for refills to be completed.    Today you received the following chemotherapy and/or immunotherapy agents Taxol, Carboplatin      To help prevent nausea and vomiting after your treatment, we encourage you to take your nausea medication as directed.  BELOW ARE SYMPTOMS THAT SHOULD BE REPORTED IMMEDIATELY: *FEVER GREATER THAN 100.4 F (38 C) OR HIGHER *CHILLS OR SWEATING *NAUSEA AND VOMITING THAT IS NOT CONTROLLED WITH YOUR NAUSEA MEDICATION *UNUSUAL SHORTNESS OF BREATH *UNUSUAL BRUISING OR BLEEDING *URINARY PROBLEMS (pain or burning when urinating, or frequent urination) *BOWEL PROBLEMS (unusual diarrhea, constipation, pain near the anus) TENDERNESS IN MOUTH AND THROAT WITH OR WITHOUT PRESENCE OF ULCERS (sore throat, sores in mouth, or a toothache) UNUSUAL RASH, SWELLING OR PAIN  UNUSUAL VAGINAL DISCHARGE OR ITCHING   Items with * indicate a potential emergency and should be followed up as soon as possible or go to the Emergency Department if any problems should occur.  Please show the CHEMOTHERAPY ALERT CARD or  IMMUNOTHERAPY ALERT CARD at check-in to the Emergency Department and triage nurse.  Should you have questions after your visit or need to cancel or reschedule your appointment, please contact [facility] - A DEPT OF [facility]  Dept: [phone]  and follow the prompts.  Office hours are 8:00 a.m. to 4:30 p.m. Monday - Friday. Please note that voicemails left after 4:00 p.m. may not be returned until the following business day.  We are closed weekends and major holidays. You have access to a nurse at all times for urgent questions. Please call the main number to the clinic Dept: [phone] and follow the prompts.   For any non-urgent questions, you may also contact your provider using MyChart. We now offer e-Visits for anyone 64 and older to request care online for non-urgent symptoms. For details visit [email or web address].   Also download the MyChart app! Go to the app store, search "MyChart", open the app, select [facility], and log in with your MyChart username and password.

## 2024-01-05 ENCOUNTER — Other Ambulatory Visit: Payer: Self-pay

## 2024-01-05 ENCOUNTER — Inpatient Hospital Stay: Payer: Self-pay

## 2024-01-05 VITALS — BP 131/80 | HR 60 | Temp 98.6°F | Resp 18

## 2024-01-05 DIAGNOSIS — Z95828 Presence of other vascular implants and grafts: Secondary | ICD-10-CM

## 2024-01-05 DIAGNOSIS — C760 Malignant neoplasm of head, face and neck: Secondary | ICD-10-CM

## 2024-01-05 DIAGNOSIS — C7800 Secondary malignant neoplasm of unspecified lung: Secondary | ICD-10-CM

## 2024-01-05 MED ORDER — DIPHENHYDRAMINE HCL 50 MG/ML IJ SOLN
25.0000 mg | Freq: Once | INTRAMUSCULAR | Status: AC
  Administered 2024-01-05: 25 mg via INTRAVENOUS
  Filled 2024-01-05: qty 1

## 2024-01-05 MED ORDER — SODIUM CHLORIDE 0.9% FLUSH
10.0000 mL | INTRAVENOUS | Status: DC | PRN
  Administered 2024-01-05: 10 mL via INTRAVENOUS

## 2024-01-05 MED ORDER — HEPARIN SOD (PORK) LOCK FLUSH 100 UNIT/ML IV SOLN
500.0000 [IU] | Freq: Once | INTRAVENOUS | Status: AC
Start: 2024-01-05 — End: 2024-01-05
  Administered 2024-01-05: 500 [IU] via INTRAVENOUS

## 2024-01-05 MED ORDER — SODIUM CHLORIDE 0.9 % IV SOLN: Freq: Once | INTRAVENOUS | Status: AC

## 2024-01-05 NOTE — Patient Instructions (Signed)
 Dehydration, Adult Dehydration is a condition in which there is not enough water or other fluids in the body. This happens when a person loses more fluids than they take in. Important organs cannot work right without the right amount of fluids. Any loss of fluids from the body can cause dehydration. Dehydration can be mild, worse, or very bad. It should be treated right away to keep it from getting very bad. What are the causes? Conditions that cause loss of water in the body. They include: Watery poop (diarrhea). Vomiting. Sweating a lot. Fever. Infection. Peeing (urinating) a lot. Not drinking enough fluids. Certain medicines, such as medicines that take extra fluid out of the body (diuretics). Lack of safe drinking water. Not being able to get enough water and food. What increases the risk? Having a long-term (chronic) illness that has not been treated the right way, such as: Diabetes. Heart disease. Kidney disease. Being 25 years of age or older. Having a disability. Living in a place that is high above the ground or sea (high in altitude). The thinner, drier air causes more fluid loss. Doing exercises that put stress on your body for a long time. Being active when in hot places. What are the signs or symptoms? Symptoms of dehydration depend on how bad it is. Mild or worse dehydration Thirst. Dry lips or dry mouth. Feeling dizzy or light-headed. Muscle cramps. Passing little pee or dark pee. Pee may be the color of tea. Headache. Very bad dehydration Changes in skin. Skin may: Be cold to the touch (clammy). Be blotchy or pale. Not go back to normal right after you pinch it and let it go. Little or no tears, pee, or sweat. Fast breathing. Low blood pressure. Weak pulse. Pulse that is more than 100 beats a minute when you are sitting still. Other changes, such as: Feeling very thirsty. Eyes that look hollow (sunken). Cold hands and feet. Being confused. Being very  tired (lethargic) or having trouble waking from sleep. Losing weight. Loss of consciousness. How is this treated? Treatment for this condition depends on how bad your dehydration is. Treatment should start right away. Do not wait until your condition gets very bad. Very bad dehydration is an emergency. You will need to go to a hospital. Mild or worse dehydration can be treated at home. You may be asked to: Drink more fluids. Drink an oral rehydration solution (ORS). This drink gives you the right amount of fluids, salts, and minerals (electrolytes). Very bad dehydration can be treated: With fluids through an IV tube. By correcting low levels of electrolytes in the body. By treating the problem that caused your dehydration. Follow these instructions at home: Oral rehydration solution If told by your doctor, drink an ORS: Make an ORS. Use instructions on the package. Start by drinking small amounts, about  cup (120 mL) every 5-10 minutes. Slowly drink more until you have had the amount that your doctor said to have.  Eating and drinking  Drink enough clear fluid to keep your pee pale yellow. If you were told to drink an ORS, finish the ORS first. Then, start slowly drinking other clear fluids. Drink fluids such as: Water. Do not drink only water. Doing that can make the salt (sodium) level in your body get too low. Water from ice chips you suck on. Fruit juice that you have added water to (diluted). Low-calorie sports drinks. Eat foods that have the right amounts of salts and minerals, such as bananas, oranges, potatoes,  tomatoes, or spinach. Do not drink alcohol. Avoid drinks that have caffeine or sugar. These include:: High-calorie sports drinks. Fruit juice that you did not add water to. Soda. Coffee or energy drinks. Avoid foods that are greasy or have a lot of fat or sugar. General instructions Take over-the-counter and prescription medicines only as told by your doctor. Do  not take sodium tablets. Doing that can make the salt level in your body get too high. Return to your normal activities as told by your doctor. Ask your doctor what activities are safe for you. Keep all follow-up visits. Your doctor may check and change your treatment. Contact a doctor if: You have pain in your belly (abdomen) and the pain: Gets worse. Stays in one place. You have a rash. You have a stiff neck. You get angry or annoyed more easily than normal. You are more tired or have a harder time waking than normal. You feel weak or dizzy. You feel very thirsty. Get help right away if: You have any symptoms of very bad dehydration. You vomit every time you eat or drink. Your vomiting gets worse, does not go away, or you vomit blood or green stuff. You are getting treatment, but symptoms are getting worse. You have a fever. You have a very bad headache. You have: Diarrhea that gets worse or does not go away. Blood in your poop (stool). This may cause poop to look black and tarry. No pee in 6-8 hours. Only a small amount of pee in 6-8 hours, and the pee is very dark. You have trouble breathing. These symptoms may be an emergency. Get help right away. Call 911. Do not wait to see if the symptoms will go away. Do not drive yourself to the hospital. This information is not intended to replace advice given to you by your health care provider. Make sure you discuss any questions you have with your health care provider. Document Revised: 04/20/2022 Document Reviewed: 04/20/2022 Elsevier Patient Education  2024 [employer].

## 2024-01-06 ENCOUNTER — Inpatient Hospital Stay: Payer: Self-pay

## 2024-01-06 ENCOUNTER — Other Ambulatory Visit: Payer: Self-pay

## 2024-01-06 ENCOUNTER — Emergency Department (HOSPITAL_BASED_OUTPATIENT_CLINIC_OR_DEPARTMENT_OTHER)
Admission: EM | Admit: 2024-01-06 | Discharge: 2024-01-06 | Disposition: A | Discharge status: Home / Self Care | Payer: Self-pay | Attending: Emergency Medicine | Admitting: Emergency Medicine

## 2024-01-06 ENCOUNTER — Encounter (HOSPITAL_BASED_OUTPATIENT_CLINIC_OR_DEPARTMENT_OTHER): Payer: Self-pay

## 2024-01-06 ENCOUNTER — Emergency Department (HOSPITAL_BASED_OUTPATIENT_CLINIC_OR_DEPARTMENT_OTHER): Payer: Self-pay

## 2024-01-06 DIAGNOSIS — M542 Cervicalgia: Secondary | ICD-10-CM | POA: Insufficient documentation

## 2024-01-06 LAB — COMPREHENSIVE METABOLIC PANEL WITH GFR
ALT: 17 U/L (ref 0–44)
AST: 27 U/L (ref 15–41)
Albumin: 4 g/dL (ref 3.5–5.0)
Alkaline Phosphatase: 47 U/L (ref 38–126)
Anion gap: 10 (ref 5–15)
BUN: 20 mg/dL (ref 6–20)
CO2: 24 mmol/L (ref 22–32)
Calcium: 9 mg/dL (ref 8.9–10.3)
Chloride: 97 mmol/L — ABNORMAL LOW (ref 98–111)
Creatinine, Ser: 0.68 mg/dL (ref 0.61–1.24)
GFR, Estimated: >60 mL/min (ref >60)
Glucose, Bld: 105 mg/dL — ABNORMAL HIGH (ref 70–99)
Potassium: 3.5 mmol/L (ref 3.5–5.1)
Sodium: 131 mmol/L — ABNORMAL LOW (ref 135–145)
Total Bilirubin: 0.6 mg/dL (ref 0.0–1.2)
Total Protein: 7 g/dL (ref 6.5–8.1)

## 2024-01-06 LAB — CBC WITH DIFFERENTIAL/PLATELET
Abs Immature Granulocytes: 0.01 10*3/uL (ref 0.00–0.07)
Basophils Absolute: 0 10*3/uL (ref 0.0–0.1)
Basophils Relative: 0 %
Eosinophils Absolute: 0 10*3/uL (ref 0.0–0.5)
Eosinophils Relative: 0 %
HCT: 33 % — ABNORMAL LOW (ref 39.0–52.0)
Hemoglobin: 11.1 g/dL — ABNORMAL LOW (ref 13.0–17.0)
Immature Granulocytes: 0 %
Lymphocytes Relative: 14 %
Lymphs Abs: 0.8 10*3/uL (ref 0.7–4.0)
MCH: 28.7 pg (ref 26.0–34.0)
MCHC: 33.6 g/dL (ref 30.0–36.0)
MCV: 85.3 fL (ref 80.0–100.0)
Monocytes Absolute: 0.2 10*3/uL (ref 0.1–1.0)
Monocytes Relative: 3 %
Neutro Abs: 4.8 10*3/uL (ref 1.7–7.7)
Neutrophils Relative %: 83 %
Platelets: 207 10*3/uL (ref 150–400)
RBC: 3.87 MIL/uL — ABNORMAL LOW (ref 4.22–5.81)
RDW: 17.8 % — ABNORMAL HIGH (ref 11.5–15.5)
WBC: 5.8 10*3/uL (ref 4.0–10.5)
nRBC: 0 % (ref 0.0–0.2)

## 2024-01-06 LAB — RESP PANEL BY RT-PCR (RSV, FLU A&B, COVID)  RVPGX2
Influenza A by PCR: NEGATIVE
Influenza B by PCR: NEGATIVE
Resp Syncytial Virus by PCR: NEGATIVE
SARS Coronavirus 2 by RT PCR: NEGATIVE

## 2024-01-06 LAB — GROUP A STREP BY PCR: Group A Strep by PCR: NOT DETECTED

## 2024-01-06 MED ORDER — HYDROMORPHONE HCL 1 MG/ML IJ SOLN
1.0000 mg | Freq: Once | INTRAMUSCULAR | Status: AC
  Administered 2024-01-06: 1 mg via INTRAVENOUS
  Filled 2024-01-06: qty 1

## 2024-01-06 MED ORDER — LACTATED RINGERS IV BOLUS
1000.0000 mL | Freq: Once | INTRAVENOUS | Status: AC
Start: 2024-01-06 — End: 2024-01-06
  Administered 2024-01-06: 1000 mL via INTRAVENOUS

## 2024-01-06 MED ORDER — DEXAMETHASONE SODIUM PHOSPHATE 10 MG/ML IJ SOLN
10.0000 mg | Freq: Once | INTRAMUSCULAR | Status: DC
  Filled 2024-01-06: qty 1

## 2024-01-06 MED ORDER — KETOROLAC TROMETHAMINE 15 MG/ML IJ SOLN
15.0000 mg | Freq: Once | INTRAMUSCULAR | Status: AC
  Administered 2024-01-06: 15 mg via INTRAVENOUS
  Filled 2024-01-06: qty 1

## 2024-01-06 MED ORDER — METHYLPREDNISOLONE SODIUM SUCC 125 MG IJ SOLR
125.0000 mg | Freq: Once | INTRAMUSCULAR | Status: AC
  Administered 2024-01-06: 125 mg via INTRAVENOUS
  Filled 2024-01-06: qty 2

## 2024-01-06 MED ORDER — HEPARIN SOD (PORK) LOCK FLUSH 100 UNIT/ML IV SOLN
500.0000 [IU] | Freq: Once | INTRAVENOUS | Status: DC
  Filled 2024-01-06: qty 5

## 2024-01-06 MED ORDER — HEPARIN SOD (PORK) LOCK FLUSH 100 UNIT/ML IV SOLN
500.0000 [IU] | Freq: Once | INTRAVENOUS | Status: AC
  Administered 2024-01-06: 500 [IU]

## 2024-01-06 MED ORDER — MORPHINE SULFATE (PF) 4 MG/ML IV SOLN
4.0000 mg | Freq: Once | INTRAVENOUS | Status: AC
  Administered 2024-01-06: 4 mg via INTRAVENOUS
  Filled 2024-01-06: qty 1

## 2024-01-06 NOTE — ED Notes (Signed)
 Pt alert and oriented X 4 at the time of discharge. RR even and unlabored. No acute distress noted. Pt verbalized understanding of discharge instructions as discussed. Pt ambulatory to lobby at time of discharge.

## 2024-01-06 NOTE — ED Provider Notes (Signed)
 [facility] Provider Note   CSN: [id number] Arrival date & time: 01/06/24  1533     History Chief Complaint  Patient presents with   Neck Pain    HPI Christian Simmons is a 38 y.o. male presenting for chief complaint of neck pain. Is acute on chronic.  States it has been going on for 15 weeks. States he had to come to the ER for acute pain a few times. Has talked with his oncologist about it.  They restarted Zanaflex as seen to be helping but today he is having acute exacerbation of the same pain.  Denies fevers chills nausea vomiting syncope shortness of breath.  Patient's recorded medical, surgical, social, medication list and allergies were reviewed in the Snapshot window as part of the initial history.   Review of Systems   Review of Systems  Constitutional:  Negative for chills and fever.  HENT:  Negative for ear pain and sore throat.   Eyes:  Negative for pain and visual disturbance.  Respiratory:  Negative for cough and shortness of breath.   Cardiovascular:  Negative for chest pain and palpitations.  Gastrointestinal:  Negative for abdominal pain and vomiting.  Genitourinary:  Negative for dysuria and hematuria.  Musculoskeletal:  Positive for neck pain. Negative for arthralgias and back pain.  Skin:  Negative for color change and rash.  Neurological:  Negative for seizures and syncope.  All other systems reviewed and are negative.   Physical Exam Updated Vital Signs BP 124/80   Pulse (!) 56   Temp 98.6 F (37 C) (Oral)   Resp 20   Ht 5\' 6"  (1.676 m)   Wt 77.2 kg   SpO2 100%   BMI 27.47 kg/m  Physical Exam Vitals and nursing note reviewed.  Constitutional:      General: He is not in acute distress.    Appearance: He is well-developed.  HENT:     Head: Normocephalic and atraumatic.  Eyes:     Conjunctiva/sclera: Conjunctivae normal.  Cardiovascular:     Rate and Rhythm: Normal rate and regular rhythm.      Heart sounds: No murmur heard. Pulmonary:     Effort: Pulmonary effort is normal. No respiratory distress.     Breath sounds: Normal breath sounds.  Abdominal:     Palpations: Abdomen is soft.     Tenderness: There is no abdominal tenderness.  Musculoskeletal:        General: No swelling.     Cervical back: Rigidity present. No tenderness.  Skin:    General: Skin is warm and dry.     Capillary Refill: Capillary refill takes less than 2 seconds.  Neurological:     Mental Status: He is alert.  Psychiatric:        Mood and Affect: Mood normal.      ED Course/ Medical Decision Making/ A&P    Procedures Procedures   Medications Ordered in ED Medications  morphine (PF) 4 MG/ML injection 4 mg (4 mg Intravenous Given 01/06/24 1633)  lactated ringers bolus 1,000 mL (0 mLs Intravenous Stopped 01/06/24 1905)  ketorolac (TORADOL) 15 MG/ML injection 15 mg (15 mg Intravenous Given 01/06/24 1734)  methylPREDNISolone sodium succinate (SOLU-MEDROL) 125 mg/2 mL injection 125 mg (125 mg Intravenous Given 01/06/24 1735)  HYDROmorphone (DILAUDID) injection 1 mg (1 mg Intravenous Given 01/06/24 1734)    Medical Decision Making:    Velma Hanna is a 38 y.o. male who presented to the ED today  with neck pain detailed above.     Additional history discussed with patient's family/caregivers.  Patient placed on continuous vitals and telemetry monitoring while in ED which was reviewed periodically.   Complete initial physical exam performed, notably the patient  was hemodynamically stable no acute distress. Very limited range of motion of his neck.      Reviewed and confirmed nursing documentation for past medical history, family history, social history.    Initial Assessment:   With the patient's presentation of neck pain, most likely diagnosis is nonspecific etiology versus complication from his known malignancy. Other diagnoses were considered including (but not limited to) deep space neck infection  including RPA/PTA, strep throat, viral upper respiratory infection. These are considered less likely due to the history of present illness and physical exam findings.   This is most consistent with an acute life/limb threatening illness complicated by underlying chronic conditions.  Initial Plan:  Recommended CT with contrast of the neck to evaluate for potential structural etiology.  Patient declined and stated that he understood risk of missed disease. Screening labs including CBC and Metabolic panel to evaluate for infectious or metabolic etiology of disease.  Urinalysis with reflex culture ordered to evaluate for UTI or relevant urologic/nephrologic pathology.  Objective evaluation as below reviewed with plan for close reassessment  Initial Study Results:   Laboratory  All laboratory results reviewed without evidence of clinically relevant pathology.    Reassessment and Plan:   Patient observed over 4 hours in the ER.  Symptoms grossly improved. Took multiple rounds of medication to get his pain under control.  However he ultimately had resolution.  He continued to refuse CT scan and expressed understanding of risk of missed disease.  Requested to be discharged to follow-up with his primary oncologist/pain provider and I would recommend consideration of follow-up in spine clinic depending on his goals of care at this time.   Disposition:  I have considered need for hospitalization, however, considering all of the above, I believe this patient is stable for discharge at this time.  Patient/family educated about specific return precautions for given chief complaint and symptoms.  Patient/family educated about follow-up with PCP.     Patient/family expressed understanding of return precautions and need for follow-up. Patient spoken to regarding all imaging and laboratory results and appropriate follow up for these results. All education provided in verbal form with additional information in  written form. Time was allowed for answering of patient questions. Patient discharged.    Emergency Department Medication Summary:   Medications  morphine (PF) 4 MG/ML injection 4 mg (4 mg Intravenous Given 01/06/24 1633)  lactated ringers bolus 1,000 mL (0 mLs Intravenous Stopped 01/06/24 1905)  ketorolac (TORADOL) 15 MG/ML injection 15 mg (15 mg Intravenous Given 01/06/24 1734)  methylPREDNISolone sodium succinate (SOLU-MEDROL) 125 mg/2 mL injection 125 mg (125 mg Intravenous Given 01/06/24 1735)  HYDROmorphone (DILAUDID) injection 1 mg (1 mg Intravenous Given 01/06/24 1734)         Clinical Impression:  1. Neck pain      Discharge   Final Clinical Impression(s) / ED Diagnoses Final diagnoses:  Neck pain    Rx / DC Orders ED Discharge Orders     None         Glyn Ade, MD 01/06/24 Serena Croissant   Glyn Ade, MD 01/07/24 [phone]

## 2024-01-06 NOTE — ED Triage Notes (Addendum)
 Patient arrives with complaints of increased pressure to neck x1 day. Current diagnosis of neck cancer. Patient took home medications with minimal relief. Rates pressure a 10/10.

## 2024-01-06 NOTE — ED Notes (Signed)
 The patient requested to have their port flushed with heparin upon deaccessing, despite their religious beliefs about receiving heparin.

## 2024-01-07 ENCOUNTER — Emergency Department (HOSPITAL_COMMUNITY): Payer: MEDICAID

## 2024-01-07 ENCOUNTER — Inpatient Hospital Stay: Payer: Self-pay | Admitting: Licensed Clinical Social Worker

## 2024-01-07 ENCOUNTER — Encounter (HOSPITAL_COMMUNITY): Payer: Self-pay

## 2024-01-07 ENCOUNTER — Telehealth: Payer: Self-pay

## 2024-01-07 ENCOUNTER — Inpatient Hospital Stay (HOSPITAL_COMMUNITY)
Admission: EM | Admit: 2024-01-07 | Discharge: 2024-01-11 | DRG: 147 | Disposition: A | Discharge status: Home / Self Care | Payer: MEDICAID | Attending: Internal Medicine | Admitting: Internal Medicine

## 2024-01-07 ENCOUNTER — Other Ambulatory Visit: Payer: Self-pay

## 2024-01-07 ENCOUNTER — Inpatient Hospital Stay (HOSPITAL_COMMUNITY): Payer: MEDICAID

## 2024-01-07 ENCOUNTER — Encounter: Payer: Self-pay | Admitting: Hematology and Oncology

## 2024-01-07 DIAGNOSIS — F411 Generalized anxiety disorder: Secondary | ICD-10-CM | POA: Diagnosis present

## 2024-01-07 DIAGNOSIS — Z7189 Other specified counseling: Secondary | ICD-10-CM

## 2024-01-07 DIAGNOSIS — R54 Age-related physical debility: Secondary | ICD-10-CM | POA: Diagnosis present

## 2024-01-07 DIAGNOSIS — C770 Secondary and unspecified malignant neoplasm of lymph nodes of head, face and neck: Secondary | ICD-10-CM

## 2024-01-07 DIAGNOSIS — Z888 Allergy status to other drugs, medicaments and biological substances status: Secondary | ICD-10-CM

## 2024-01-07 DIAGNOSIS — Z902 Acquired absence of lung [part of]: Secondary | ICD-10-CM

## 2024-01-07 DIAGNOSIS — Z79891 Long term (current) use of opiate analgesic: Secondary | ICD-10-CM

## 2024-01-07 DIAGNOSIS — C760 Malignant neoplasm of head, face and neck: Principal | ICD-10-CM | POA: Diagnosis present

## 2024-01-07 DIAGNOSIS — C7802 Secondary malignant neoplasm of left lung: Secondary | ICD-10-CM | POA: Diagnosis present

## 2024-01-07 DIAGNOSIS — G893 Neoplasm related pain (acute) (chronic): Secondary | ICD-10-CM | POA: Diagnosis present

## 2024-01-07 DIAGNOSIS — Z85818 Personal history of malignant neoplasm of other sites of lip, oral cavity, and pharynx: Secondary | ICD-10-CM

## 2024-01-07 DIAGNOSIS — R64 Cachexia: Secondary | ICD-10-CM | POA: Diagnosis present

## 2024-01-07 DIAGNOSIS — G40909 Epilepsy, unspecified, not intractable, without status epilepticus: Secondary | ICD-10-CM | POA: Diagnosis present

## 2024-01-07 DIAGNOSIS — D638 Anemia in other chronic diseases classified elsewhere: Secondary | ICD-10-CM | POA: Diagnosis present

## 2024-01-07 DIAGNOSIS — Z79899 Other long term (current) drug therapy: Secondary | ICD-10-CM

## 2024-01-07 DIAGNOSIS — Z515 Encounter for palliative care: Secondary | ICD-10-CM

## 2024-01-07 DIAGNOSIS — Z7984 Long term (current) use of oral hypoglycemic drugs: Secondary | ICD-10-CM

## 2024-01-07 DIAGNOSIS — E039 Hypothyroidism, unspecified: Secondary | ICD-10-CM | POA: Diagnosis present

## 2024-01-07 DIAGNOSIS — E1140 Type 2 diabetes mellitus with diabetic neuropathy, unspecified: Secondary | ICD-10-CM | POA: Diagnosis present

## 2024-01-07 DIAGNOSIS — E871 Hypo-osmolality and hyponatremia: Secondary | ICD-10-CM | POA: Diagnosis present

## 2024-01-07 DIAGNOSIS — Z8249 Family history of ischemic heart disease and other diseases of the circulatory system: Secondary | ICD-10-CM

## 2024-01-07 DIAGNOSIS — Z7952 Long term (current) use of systemic steroids: Secondary | ICD-10-CM

## 2024-01-07 DIAGNOSIS — Z833 Family history of diabetes mellitus: Secondary | ICD-10-CM

## 2024-01-07 DIAGNOSIS — R4589 Other symptoms and signs involving emotional state: Secondary | ICD-10-CM

## 2024-01-07 DIAGNOSIS — K029 Dental caries, unspecified: Secondary | ICD-10-CM | POA: Diagnosis present

## 2024-01-07 DIAGNOSIS — M542 Cervicalgia: Principal | ICD-10-CM | POA: Diagnosis present

## 2024-01-07 DIAGNOSIS — Z6826 Body mass index (BMI) 26.0-26.9, adult: Secondary | ICD-10-CM

## 2024-01-07 DIAGNOSIS — Z9221 Personal history of antineoplastic chemotherapy: Secondary | ICD-10-CM

## 2024-01-07 DIAGNOSIS — E876 Hypokalemia: Secondary | ICD-10-CM | POA: Diagnosis present

## 2024-01-07 DIAGNOSIS — Z881 Allergy status to other antibiotic agents status: Secondary | ICD-10-CM

## 2024-01-07 DIAGNOSIS — C7801 Secondary malignant neoplasm of right lung: Secondary | ICD-10-CM | POA: Diagnosis present

## 2024-01-07 DIAGNOSIS — Z79890 Hormone replacement therapy (postmenopausal): Secondary | ICD-10-CM

## 2024-01-07 LAB — CBC WITH DIFFERENTIAL/PLATELET
Abs Immature Granulocytes: 0.05 10*3/uL (ref 0.00–0.07)
Basophils Absolute: 0 10*3/uL (ref 0.0–0.1)
Basophils Relative: 0 %
Eosinophils Absolute: 0 10*3/uL (ref 0.0–0.5)
Eosinophils Relative: 0 %
HCT: 32.9 % — ABNORMAL LOW (ref 39.0–52.0)
Hemoglobin: 11.1 g/dL — ABNORMAL LOW (ref 13.0–17.0)
Immature Granulocytes: 1 %
Lymphocytes Relative: 16 %
Lymphs Abs: 1.2 10*3/uL (ref 0.7–4.0)
MCH: 28.9 pg (ref 26.0–34.0)
MCHC: 33.7 g/dL (ref 30.0–36.0)
MCV: 85.7 fL (ref 80.0–100.0)
Monocytes Absolute: 0.2 10*3/uL (ref 0.1–1.0)
Monocytes Relative: 3 %
Neutro Abs: 6 10*3/uL (ref 1.7–7.7)
Neutrophils Relative %: 80 %
Platelets: 192 10*3/uL (ref 150–400)
RBC: 3.84 MIL/uL — ABNORMAL LOW (ref 4.22–5.81)
RDW: 17.7 % — ABNORMAL HIGH (ref 11.5–15.5)
WBC: 7.5 10*3/uL (ref 4.0–10.5)
nRBC: 0 % (ref 0.0–0.2)

## 2024-01-07 LAB — COMPREHENSIVE METABOLIC PANEL WITH GFR
ALT: 18 U/L (ref 0–44)
AST: 28 U/L (ref 15–41)
Albumin: 4 g/dL (ref 3.5–5.0)
Alkaline Phosphatase: 44 U/L (ref 38–126)
Anion gap: 9 (ref 5–15)
BUN: 22 mg/dL — ABNORMAL HIGH (ref 6–20)
CO2: 26 mmol/L (ref 22–32)
Calcium: 9.3 mg/dL (ref 8.9–10.3)
Chloride: 99 mmol/L (ref 98–111)
Creatinine, Ser: 0.73 mg/dL (ref 0.61–1.24)
GFR, Estimated: >60 mL/min (ref >60)
Glucose, Bld: 102 mg/dL — ABNORMAL HIGH (ref 70–99)
Potassium: 3.3 mmol/L — ABNORMAL LOW (ref 3.5–5.1)
Sodium: 134 mmol/L — ABNORMAL LOW (ref 135–145)
Total Bilirubin: 0.6 mg/dL (ref 0.0–1.2)
Total Protein: 6.9 g/dL (ref 6.5–8.1)

## 2024-01-07 MED ORDER — IOHEXOL 300 MG/ML  SOLN
80.0000 mL | Freq: Once | INTRAMUSCULAR | Status: AC | PRN
  Administered 2024-01-07: 75 mL via INTRAVENOUS

## 2024-01-07 MED ORDER — POTASSIUM CHLORIDE 20 MEQ PO PACK
40.0000 meq | PACK | Freq: Once | ORAL | Status: AC
Start: 2024-01-07 — End: 2024-01-07
  Administered 2024-01-07: 40 meq via ORAL
  Filled 2024-01-07: qty 2

## 2024-01-07 MED ORDER — SODIUM CHLORIDE 0.9% FLUSH
3.0000 mL | Freq: Two times a day (BID) | INTRAVENOUS | Status: DC
  Administered 2024-01-08 – 2024-01-11 (×4): 3 mL via INTRAVENOUS

## 2024-01-07 MED ORDER — OXYCODONE HCL ER 10 MG PO T12A
10.0000 mg | EXTENDED_RELEASE_TABLET | Freq: Two times a day (BID) | ORAL | Status: AC
  Administered 2024-01-07: 10 mg via ORAL
  Filled 2024-01-07 (×2): qty 1

## 2024-01-07 MED ORDER — DROPERIDOL 2.5 MG/ML IJ SOLN: 2.5000 mg | Freq: Once | INTRAMUSCULAR | Status: DC

## 2024-01-07 MED ORDER — DIAZEPAM 5 MG/ML IJ SOLN
2.5000 mg | Freq: Once | INTRAMUSCULAR | Status: AC
  Administered 2024-01-07: 2.5 mg via INTRAVENOUS
  Filled 2024-01-07 (×2): qty 2

## 2024-01-07 MED ORDER — OXYCODONE HCL 5 MG PO TABS
10.0000 mg | ORAL_TABLET | ORAL | Status: DC | PRN
  Administered 2024-01-07 – 2024-01-10 (×6): 10 mg via ORAL
  Filled 2024-01-07 (×6): qty 2

## 2024-01-07 MED ORDER — CELECOXIB 200 MG PO CAPS
200.0000 mg | ORAL_CAPSULE | Freq: Two times a day (BID) | ORAL | Status: AC
  Administered 2024-01-07 – 2024-01-10 (×5): 200 mg via ORAL
  Filled 2024-01-07 (×6): qty 1

## 2024-01-07 MED ORDER — RIVAROXABAN 10 MG PO TABS
10.0000 mg | ORAL_TABLET | Freq: Every day | ORAL | Status: DC
  Administered 2024-01-07: 10 mg via ORAL
  Filled 2024-01-07 (×2): qty 1

## 2024-01-07 MED ORDER — SODIUM CHLORIDE 0.9 % IV BOLUS
1000.0000 mL | Freq: Once | INTRAVENOUS | Status: AC
  Administered 2024-01-07: 1000 mL via INTRAVENOUS

## 2024-01-07 MED ORDER — OXYCODONE HCL 5 MG PO TABS: 5.0000 mg | ORAL_TABLET | ORAL | Status: DC | PRN

## 2024-01-07 MED ORDER — BACLOFEN 10 MG PO TABS
5.0000 mg | ORAL_TABLET | Freq: Three times a day (TID) | ORAL | Status: AC
  Administered 2024-01-07 – 2024-01-09 (×4): 5 mg via ORAL
  Filled 2024-01-07 (×6): qty 1

## 2024-01-07 MED ORDER — POLYETHYLENE GLYCOL 3350 17 G PO PACK: 17.0000 g | PACK | Freq: Every day | ORAL | Status: DC | PRN

## 2024-01-07 MED ORDER — MORPHINE SULFATE (PF) 4 MG/ML IV SOLN
4.0000 mg | Freq: Once | INTRAVENOUS | Status: AC
  Administered 2024-01-07: 4 mg via INTRAVENOUS
  Filled 2024-01-07: qty 1

## 2024-01-07 MED ORDER — ACETAMINOPHEN 325 MG PO TABS
650.0000 mg | ORAL_TABLET | ORAL | Status: AC
  Administered 2024-01-07: 650 mg via ORAL
  Filled 2024-01-07 (×4): qty 2

## 2024-01-07 MED ORDER — HYDROMORPHONE HCL 1 MG/ML IJ SOLN
1.0000 mg | Freq: Once | INTRAMUSCULAR | Status: AC
  Administered 2024-01-07: 1 mg via INTRAVENOUS
  Filled 2024-01-07: qty 1

## 2024-01-07 MED ORDER — LORAZEPAM 2 MG/ML IJ SOLN
2.0000 mg | Freq: Once | INTRAMUSCULAR | Status: AC
  Administered 2024-01-07: 2 mg via INTRAVENOUS
  Filled 2024-01-07: qty 1

## 2024-01-07 MED ORDER — FENTANYL CITRATE PF 50 MCG/ML IJ SOSY
12.5000 ug | PREFILLED_SYRINGE | INTRAMUSCULAR | Status: DC | PRN
  Administered 2024-01-07 – 2024-01-08 (×7): 12.5 ug via INTRAVENOUS
  Filled 2024-01-07 (×7): qty 1

## 2024-01-07 MED ORDER — ENSURE ENLIVE PO LIQD: 237.0000 mL | Freq: Two times a day (BID) | ORAL | Status: DC

## 2024-01-07 MED ORDER — HYDROMORPHONE HCL 1 MG/ML IJ SOLN
0.5000 mg | Freq: Once | INTRAMUSCULAR | Status: AC
  Administered 2024-01-07: 0.5 mg via INTRAVENOUS
  Filled 2024-01-07: qty 1

## 2024-01-07 MED ORDER — GADOBUTROL 1 MMOL/ML IV SOLN
7.0000 mL | Freq: Once | INTRAVENOUS | Status: AC | PRN
  Administered 2024-01-07: 7 mL via INTRAVENOUS

## 2024-01-07 MED ORDER — SENNOSIDES-DOCUSATE SODIUM 8.6-50 MG PO TABS
2.0000 | ORAL_TABLET | Freq: Two times a day (BID) | ORAL | Status: DC
  Administered 2024-01-07 – 2024-01-08 (×2): 2 via ORAL
  Filled 2024-01-07 (×6): qty 2

## 2024-01-07 MED ORDER — DIAZEPAM 5 MG/ML IJ SOLN
5.0000 mg | Freq: Once | INTRAMUSCULAR | Status: AC
  Administered 2024-01-07: 5 mg via INTRAVENOUS
  Filled 2024-01-07: qty 2

## 2024-01-07 MED ORDER — HYDROMORPHONE HCL 1 MG/ML IJ SOLN: 2.0000 mg | Freq: Once | INTRAMUSCULAR | Status: DC

## 2024-01-07 NOTE — ED Provider Notes (Signed)
 [place] EMERGENCY DEPARTMENT AT [facility] Provider Note   CSN: [id number] Arrival date & time: 01/07/24  1151     History  Chief Complaint  Patient presents with   Anxiety   Neck Pain    Christian Simmons is a 38 y.o. male, hx of recurrent head/neck cancer, who presents to the ED 2/2 to worsening neck painx3 days.  He reports that he had his last dose of chemotherapy, about 3 days ago, and his neck feels very swollen, and tight.  He denies any current numbness, tingling, or chest pain.  States that the area feels very tight and painful especially on the left side, but that it alternates.  Notes that he called his oncologist, who told him it was likely anxiety, but to go to the ER to get further checked out.  He states he is having hard time swallowing, because of it.  Denies any fevers or chills.     Home Medications Prior to Admission medications   Medication Sig Start Date End Date Taking? Authorizing Provider  bisacodyl (DULCOLAX) 10 MG suppository Place 1 suppository (10 mg total) rectally as needed for moderate constipation. 06/18/23   Artis Delay, MD  blood glucose meter kit and supplies KIT Dispense based on patient and insurance preference. Use up to four times daily as directed. (FOR ICD-9 250.00, 250.01). 11/11/20   Artis Delay, MD  cetirizine (ZYRTEC) 10 MG tablet Take 1 tablet (10 mg total) by mouth daily as needed for allergies. 07/21/21   Artis Delay, MD  dexamethasone (DECADRON) 4 MG tablet Take 2 tablets by mouth the night before and 2 tablets the morning of chemotherapy, every 3 weeks x 6 cycles 09/21/23   Artis Delay, MD  famotidine (PEPCID) 40 MG tablet Take 1 tablet (40 mg total) by mouth at bedtime. 03/11/23   Artis Delay, MD  gabapentin (NEURONTIN) 100 MG capsule Take 2 capsules (200 mg total) by mouth 2 (two) times daily. 10/22/23   Artis Delay, MD  levothyroxine (SYNTHROID) 175 MCG tablet Take 1 tablet (175 mcg total) by mouth daily before breakfast.  02/05/23   Artis Delay, MD  LORazepam (ATIVAN) 0.5 MG tablet Take 1 tablet (0.5 mg total) by mouth 2 (two) times daily as needed for anxiety. 12/27/23   Artis Delay, MD  metFORMIN (GLUCOPHAGE) 500 MG tablet Take 1 tablet (500 mg total) by mouth 2 (two) times daily with a meal. 01/29/23   Artis Delay, MD  methadone (DOLOPHINE) 10 MG tablet Take 1 tablet (10 mg) by mouth every 8 hours. 01/04/24   Artis Delay, MD  ondansetron (ZOFRAN) 8 MG tablet Take 1 tablet (8 mg total) by mouth every 8 (eight) hours as needed for nausea or vomiting. 08/20/23   Artis Delay, MD  Oxycodone HCl 10 MG TABS Take 1 tablet (10 mg total) by mouth every 4 (four) hours as needed. 12/30/23   Artis Delay, MD  pantoprazole (PROTONIX) 40 MG tablet Take 1 tablet (40 mg total) by mouth daily. 10/26/23   Artis Delay, MD  polyethylene glycol (MIRALAX / GLYCOLAX) 17 g packet Take 17 g by mouth 2 (two) times daily. 09/10/23   Artis Delay, MD  prochlorperazine (COMPAZINE) 10 MG tablet Take 1 tablet (10 mg total) by mouth every 6 (six) hours as needed for nausea or vomiting. 05/07/23   Artis Delay, MD  senna-docusate (SENOKOT-S) 8.6-50 MG tablet Take 2 tablets by mouth 2 (two) times daily. 11/20/22   Artis Delay, MD  tiZANidine (ZANAFLEX) 2  MG tablet Take 1 tablet (2 mg total) by mouth every 6 (six) hours as needed for muscle spasms. 11/18/23   Artis Delay, MD  venlafaxine XR (EFFEXOR-XR) 37.5 MG 24 hr capsule Take 1 capsule (37.5 mg total) by mouth daily with breakfast. 01/04/24   Artis Delay, MD      Allergies    Phenergan [promethazine hcl], Heparin, and Clindamycin    Review of Systems   Review of Systems  Constitutional:  Negative for chills.  Cardiovascular:  Negative for chest pain.  Musculoskeletal:  Positive for neck pain.    Physical Exam Updated Vital Signs BP (!) 144/91 (BP Location: Left Arm)   Pulse 60   Temp 98.7 F (37.1 C) (Oral)   Resp 18   Ht 5\' 6"  (1.676 m)   Wt 77.2 kg   SpO2 100%   BMI 27.47 kg/m  Physical  Exam Vitals and nursing note reviewed.  Constitutional:      General: He is not in acute distress.    Appearance: He is well-developed.  HENT:     Head: Normocephalic and atraumatic.  Eyes:     Conjunctiva/sclera: Conjunctivae normal.  Neck:     Comments: Tenderness to palpation, bilateral sides of the neck, without any lymphadenopathy, or overt swelling.  Uvula midline. ROM intact.  Cardiovascular:     Rate and Rhythm: Normal rate and regular rhythm.     Heart sounds: No murmur heard. Pulmonary:     Effort: Pulmonary effort is normal. No respiratory distress.     Breath sounds: Normal breath sounds.  Abdominal:     Palpations: Abdomen is soft.     Tenderness: There is no abdominal tenderness.  Musculoskeletal:        General: No swelling.     Cervical back: Neck supple.  Skin:    General: Skin is warm and dry.     Capillary Refill: Capillary refill takes less than 2 seconds.  Neurological:     Mental Status: He is alert.  Psychiatric:        Mood and Affect: Mood normal.     ED Results / Procedures / Treatments   Labs (all labs ordered are listed, but only abnormal results are displayed) Labs Reviewed  CBC WITH DIFFERENTIAL/PLATELET - Abnormal; Notable for the following components:      Result Value   RBC 3.84 (*)    Hemoglobin 11.1 (*)    HCT 32.9 (*)    RDW 17.7 (*)    All other components within normal limits  COMPREHENSIVE METABOLIC PANEL WITH GFR - Abnormal; Notable for the following components:   Sodium 134 (*)    Potassium 3.3 (*)    Glucose, Bld 102 (*)    BUN 22 (*)    All other components within normal limits    EKG None  Radiology CT Soft Tissue Neck W Contrast Result Date: 01/07/2024 CLINICAL DATA:  Non pulsatile neck mass. Personal history of head and neck cancer. Increasing pain and swelling. EXAM: CT NECK WITH CONTRAST TECHNIQUE: Multidetector CT imaging of the neck was performed using the standard protocol following the bolus administration of  intravenous contrast. RADIATION DOSE REDUCTION: This exam was performed according to the departmental dose-optimization program which includes automated exposure control, adjustment of the mA and/or kV according to patient size and/or use of iterative reconstruction technique. CONTRAST:  75mL OMNIPAQUE IOHEXOL 300 MG/ML  SOLN COMPARISON:  CT neck with contrast 10/04/2023 and 04/13/2023. FINDINGS: Pharynx and larynx: Nasopharynx is clear. Soft  palate and tongue base are within normal limits. The oropharynx is clear. Vallecula and epiglottis are within normal limits. Aryepiglottic folds and piriform sinuses are clear. Vocal cords are midline and symmetric. Trachea is clear. Salivary glands: The submandibular glands are not discretely visualized. Parotid glands are within normal limits bilaterally. Thyroid: Atrophic Lymph nodes: No discrete adenopathy is present. 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 Minimal atherosclerotic changes are present at the carotid bifurcations without significant stenosis. Limited intracranial: Within normal limits. Visualized orbits: The globes and orbits are within normal limits. Mastoids and visualized paranasal sinuses: The paranasal sinuses and mastoid air cells are clear. Skeleton: Vertebral body heights and alignment are normal. No focal osseous lesions are present. Large dental caries and periapical lucencies are noted in the residual left maxillary molar. Upper chest: Scarring in the left upper lobe and postoperative changes are stable. Other: A progressive diffuse soft tissue mass present in the posterior right lateral neck. This extends to the right C2-3 and C3-4 neural foramina. Irregular soft tissue surrounds the spinal cord at both levels. A more discrete mass is present a posterior right spinal canal or possibly within the spinal cord at C2-3. This demonstrates scratched at this is similar to the December study but has progressed since the July study. IMPRESSION: 1.  Progressive diffuse soft tissue mass in the posterior right lateral neck. This extends to the right C2-3 and C3-4 neural foramina. Irregular soft tissue surrounds the spinal cord at both levels. 2. A more discrete mass is present a posterior right spinal canal or possibly within the spinal cord at C2-3. This is similar to the December study but has progressed since the July study. MRI of the cervical spine without and with contrast would be useful for further evaluation. 3. No discrete adenopathy. 4. Large dental caries and periapical lucencies in the residual left maxillary molar. 5. Stable scarring in the left upper lobe and postoperative changes. These results were called by telephone at the time of interpretation on 01/07/2024 at 4:39 pm to provider [facility] Kattie Santoyo , who verbally acknowledged these results. Electronically Signed   By: Marin Roberts M.D.   On: 01/07/2024 16:50    Procedures Procedures    Medications Ordered in ED Medications  diazepam (VALIUM) injection 2.5 mg (2.5 mg Intravenous Given 01/07/24 1336)  morphine (PF) 4 MG/ML injection 4 mg (4 mg Intravenous Given 01/07/24 1322)  sodium chloride 0.9 % bolus 1,000 mL (1,000 mLs Intravenous New Bag/Given 01/07/24 1321)  iohexol (OMNIPAQUE) 300 MG/ML solution 80 mL (75 mLs Intravenous Contrast Given 01/07/24 1507)  HYDROmorphone (DILAUDID) injection 0.5 mg (0.5 mg Intravenous Given 01/07/24 1546)  diazepam (VALIUM) injection 5 mg (5 mg Intravenous Given 01/07/24 1712)  HYDROmorphone (DILAUDID) injection 1 mg (1 mg Intravenous Given 01/07/24 1711)    ED Course/ Medical Decision Making/ A&P                                 Medical Decision Making Patient is a 38 year old male, history of head/neck cancer, here for worsening neck pain, has been going on for the last 3 days.  He was seen at [facility] yesterday, had multiple pain meds, and refuse imaging.  Pain is worse today, thus he presents.  CT neck ordered, will start with  morphine, and Valium to see if any relief.  He is very anxious on my exam.  He is agreeable with imaging however.  He does not have any rigidity, but he states that he has severe pain, and bilateral sides of his neck.  Amount and/or Complexity of Data Reviewed Labs: ordered.    Details: Unremarkable blood work Radiology: ordered.    Details: CT neck, shows progressive diffuse soft tissue mass, in the posterior right lateral neck, with irregular soft tissue, extending spinal cord,, more discrete mass, in the spinal canal versus cord at C2/C3 similar to December 2024 study Discussion of management or test interpretation with external provider(s): Discussed with patient, of findings of CT neck, possible more progressive diffuse soft tissue mass, this may be causing more of the pain, I spoke with Dr. Mosetta Putt, who recommended admission, given this is the second day, the patient has presented to the ER, with neck pain, and worsening CT findings.  MRI ordered, will admit to hospitalist for pain control.  Has had 1.5 mg total of Dilaudid here, 7.5 mg of value here, as well as 4 mg of morphine, ordered dexamethasone as well for pain control, still intractable pain. Admitted to Dr. Maryjean Ka  Risk Prescription drug management. Decision regarding hospitalization.    Final Clinical Impression(s) / ED Diagnoses Final diagnoses:  Neck pain  Head and neck cancer [facility])    Rx / DC Orders ED Discharge Orders     None         Azaiah Mello Elbert Ewings, PA 01/07/24 1805    Rexford Maus, DO 01/10/24 [phone]

## 2024-01-07 NOTE — ED Notes (Signed)
 ED TO INPATIENT HANDOFF REPORT  ED Nurse Name and Phone #: Majel Homer, RN   S Name/Age/Gender Christian Simmons 38 y.o. male Room/Bed: WA16/WA16  Code Status   Code Status: Full Code  Home/SNF/Other Home Patient oriented to: self, place, time, and situation Is this baseline? Yes   Triage Complete: Triage complete  Chief Complaint Neck pain [M54.2]  Triage Note Patient arrives with complaints of increased pain to neck x3 days, left side. Current diagnosis of neck cancer. Last chemo was a few days ago. Patient endorses he feels anxious. Rates pain a 10/10.    Allergies Allergies  Allergen Reactions   Phenergan [Promethazine Hcl] Anxiety    Causes patients HR to go into the 140's with severe anxiety   Heparin Other (See Comments)    No Pork derivatives due to religion   Clindamycin Rash    Level of Care/Admitting Diagnosis ED Disposition     ED Disposition  Admit   Condition  --   Comment  Hospital [facility]: [facility] [[id number]]  Level of Care: Med-Surg [16]  May admit patient to [facility] or [facility] if equivalent level of care is available:: No  Covid Evaluation: Asymptomatic - no recent exposure (last 10 days) testing not required  Diagnosis: Neck pain [161096]  Admitting Physician: Nolberto Hanlon [[id number]]  Attending Physician: Nolberto Hanlon [[id number]]  Certification:: I certify this patient will need inpatient services for at least 2 midnights  Expected Medical Readiness: 01/09/2024          B Medical/Surgery History 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   Fatigue 06/01/2014   Hypothyroidism    Infection of eyelash follicle of left eye 12/25/2015   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   Neuropathy    Radiation 07/18/14-07/31/14   Left upper lobe  40 gy in 10 fractions    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   Past Surgical History:  Procedure Laterality Date   IR CV LINE INJECTION  10/09/2022   LUNG REMOVAL, PARTIAL  05/03/2012   left upper lobectomy   nasal biopsy     RADICAL NECK DISSECTION     VIDEO BRONCHOSCOPY N/A 06/29/2014   Procedure: VIDEO BRONCHOSCOPY ;  Surgeon: Loreli Slot, MD;  Location: [facility] OR;  Service: Thoracic;  Laterality: N/A;     A IV Location/Drains/Wounds Patient Lines/Drains/Airways Status     Active Line/Drains/Airways     Name Placement date Placement time Site Days   Implanted Port 04/22/16 Right Chest Single Power 04/22/16  1232  Chest  2816            Intake/Output Last 24 hours No intake or output data in the 24 hours ending 01/07/24 1928  Labs/Imaging Results for orders placed or performed during the hospital encounter of 01/07/24 (from the past 48 hours)  CBC with Differential     Status: Abnormal   Collection Time: 01/07/24  1:19 PM  Result Value Ref Range   WBC 7.5 4.0 - 10.5 K/uL   RBC 3.84 (L) 4.22 - 5.81 MIL/uL   Hemoglobin 11.1 (L) 13.0 - 17.0 g/dL   HCT 29.5 (L) 62.1 - 30.8 %   MCV 85.7 80.0 - 100.0 fL   MCH 28.9 26.0 - 34.0 pg   MCHC 33.7 30.0 - 36.0 g/dL   RDW  17.7 (H) 11.5 - 15.5 %   Platelets 192 150 - 400 K/uL   nRBC 0.0 0.0 - 0.2 %   Neutrophils Relative % 80 %   Neutro Abs 6.0 1.7 - 7.7 K/uL   Lymphocytes Relative 16 %   Lymphs Abs 1.2 0.7 - 4.0 K/uL   Monocytes Relative 3 %   Monocytes Absolute 0.2 0.1 - 1.0 K/uL   Eosinophils Relative 0 %   Eosinophils Absolute 0.0 0.0 - 0.5 K/uL   Basophils Relative 0 %   Basophils Absolute 0.0 0.0 - 0.1 K/uL   Immature Granulocytes 1 %   Abs Immature Granulocytes 0.05 0.00 - 0.07 K/uL    Comment: Performed at [facility], [address] [place], [place] [address]  Comprehensive metabolic panel     Status: Abnormal   Collection Time: 01/07/24  1:19 PM  Result Value Ref Range   Sodium 134 (L) 135 - 145 mmol/L    Potassium 3.3 (L) 3.5 - 5.1 mmol/L   Chloride 99 98 - 111 mmol/L   CO2 26 22 - 32 mmol/L   Glucose, Bld 102 (H) 70 - 99 mg/dL    Comment: Glucose reference range applies only to samples taken after fasting for at least 8 hours.   BUN 22 (H) 6 - 20 mg/dL   Creatinine, Ser 1.91 0.61 - 1.24 mg/dL   Calcium 9.3 8.9 - 47.8 mg/dL   Total Protein 6.9 6.5 - 8.1 g/dL   Albumin 4.0 3.5 - 5.0 g/dL   AST 28 15 - 41 U/L   ALT 18 0 - 44 U/L   Alkaline Phosphatase 44 38 - 126 U/L   Total Bilirubin 0.6 0.0 - 1.2 mg/dL   GFR, Estimated >29 >56 mL/min    Comment: (NOTE) Calculated using the CKD-EPI Creatinine Equation (2021)    Anion gap 9 5 - 15    Comment: Performed at [facility], [address] [place], [place] [address]   *Note: Due to a large number of results and/or encounters for the requested time period, some results have not been displayed. A complete set of results can be found in Results Review.   CT Soft Tissue Neck W Contrast Result Date: 01/07/2024 CLINICAL DATA:  Non pulsatile neck mass. Personal history of head and neck cancer. Increasing pain and swelling. EXAM: CT NECK WITH CONTRAST TECHNIQUE: Multidetector CT imaging of the neck was performed using the standard protocol following the bolus administration of intravenous contrast. RADIATION DOSE REDUCTION: This exam was performed according to the departmental dose-optimization program which includes automated exposure control, adjustment of the mA and/or kV according to patient size and/or use of iterative reconstruction technique. CONTRAST:  75mL OMNIPAQUE IOHEXOL 300 MG/ML  SOLN COMPARISON:  CT neck with contrast 10/04/2023 and 04/13/2023. FINDINGS: Pharynx and larynx: Nasopharynx is clear. Soft palate and tongue base are within normal limits. The oropharynx is clear. Vallecula and epiglottis are within normal limits. Aryepiglottic folds and piriform sinuses are clear. Vocal cords are midline and symmetric. Trachea is  clear. Salivary glands: The submandibular glands are not discretely visualized. Parotid glands are within normal limits bilaterally. Thyroid: Atrophic Lymph nodes: No discrete adenopathy is present. 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 Minimal atherosclerotic changes are present at the carotid bifurcations without significant stenosis. Limited intracranial: Within normal limits. Visualized orbits: The globes and orbits are within normal limits. Mastoids and visualized paranasal sinuses: The paranasal sinuses and mastoid air cells are clear. Skeleton: Vertebral body  heights and alignment are normal. No focal osseous lesions are present. Large dental caries and periapical lucencies are noted in the residual left maxillary molar. Upper chest: Scarring in the left upper lobe and postoperative changes are stable. Other: A progressive diffuse soft tissue mass present in the posterior right lateral neck. This extends to the right C2-3 and C3-4 neural foramina. Irregular soft tissue surrounds the spinal cord at both levels. A more discrete mass is present a posterior right spinal canal or possibly within the spinal cord at C2-3. This demonstrates scratched at this is similar to the December study but has progressed since the July study. IMPRESSION: 1. Progressive diffuse soft tissue mass in the posterior right lateral neck. This extends to the right C2-3 and C3-4 neural foramina. Irregular soft tissue surrounds the spinal cord at both levels. 2. A more discrete mass is present a posterior right spinal canal or possibly within the spinal cord at C2-3. This is similar to the December study but has progressed since the July study. MRI of the cervical spine without and with contrast would be useful for further evaluation. 3. No discrete adenopathy. 4. Large dental caries and periapical lucencies in the residual left maxillary molar. 5. Stable scarring in the left upper lobe and postoperative changes. These results were  called by telephone at the time of interpretation on 01/07/2024 at 4:39 pm to provider [facility] , who verbally acknowledged these results. Electronically Signed   By: Marin Roberts M.D.   On: 01/07/2024 16:50    Pending Labs Unresulted Labs (From admission, onward)     Start     Ordered   01/08/24 0500  APTT  Tomorrow morning,   R        01/07/24 1902   01/08/24 0500  Protime-INR  Tomorrow morning,   R        01/07/24 1902   01/08/24 0500  Basic metabolic panel  Tomorrow morning,   R        01/07/24 1902   01/08/24 0500  CBC  Tomorrow morning,   R        01/07/24 1902   01/07/24 1901  HIV Antibody (routine testing w rflx)  (HIV Antibody (Routine testing w reflex) panel)  Once,   R        01/07/24 1902            Vitals/Pain Today's Vitals   01/07/24 1748 01/07/24 1845 01/07/24 1907 01/07/24 1920  BP:  (!) 132/92    Pulse:  82    Resp:  16    Temp:   98.3 F (36.8 C)   TempSrc:   Oral   SpO2:  100%    Weight:      Height:      PainSc: 6    10-Worst pain ever    Isolation Precautions No active isolations  Medications Medications  acetaminophen (TYLENOL) tablet 650 mg (650 mg Oral Given 01/07/24 1920)  celecoxib (CELEBREX) capsule 200 mg (has no administration in time range)  oxyCODONE (OXYCONTIN) 12 hr tablet 10 mg (has no administration in time range)  oxyCODONE (Oxy IR/ROXICODONE) immediate release tablet 5 mg (has no administration in time range)  oxyCODONE (Oxy IR/ROXICODONE) immediate release tablet 10 mg (has no administration in time range)  fentaNYL (SUBLIMAZE) injection 12.5 mcg (12.5 mcg Intravenous Given 01/07/24 1921)  baclofen (LIORESAL) tablet 5 mg (has no administration in time range)  senna-docusate (Senokot-S) tablet 2 tablet (has no administration in time range)  polyethylene glycol (MIRALAX / GLYCOLAX) packet 17 g (has no administration in time range)  rivaroxaban (XARELTO) tablet 10 mg (has no administration in time range)  sodium chloride  flush (NS) 0.9 % injection 3 mL (has no administration in time range)  potassium chloride (KLOR-CON) packet 40 mEq (has no administration in time range)  diazepam (VALIUM) injection 2.5 mg (2.5 mg Intravenous Given 01/07/24 1336)  morphine (PF) 4 MG/ML injection 4 mg (4 mg Intravenous Given 01/07/24 1322)  sodium chloride 0.9 % bolus 1,000 mL (0 mLs Intravenous Stopped 01/07/24 1700)  iohexol (OMNIPAQUE) 300 MG/ML solution 80 mL (75 mLs Intravenous Contrast Given 01/07/24 1507)  HYDROmorphone (DILAUDID) injection 0.5 mg (0.5 mg Intravenous Given 01/07/24 1546)  diazepam (VALIUM) injection 5 mg (5 mg Intravenous Given 01/07/24 1712)  HYDROmorphone (DILAUDID) injection 1 mg (1 mg Intravenous Given 01/07/24 1711)  LORazepam (ATIVAN) injection 2 mg (2 mg Intravenous Given 01/07/24 1922)    Mobility walks     Focused Assessments See Chart   R Recommendations: See Admitting Provider Note  Report given to:   Additional Notes: See Chart

## 2024-01-07 NOTE — ED Notes (Signed)
 Patient transported to MRI

## 2024-01-07 NOTE — H&P (Signed)
 History and Physical    Patient: Christian Simmons ZOX:[id number] DOB: November 21, 1985 DOA: 01/07/2024 DOS: the patient was seen and examined on 01/07/2024 PCP: Patient, No Pcp Per  Patient coming from: Home  Chief Complaint:  Chief Complaint  Patient presents with   Anxiety   Neck Pain   HPI: Christian Simmons is a 38 y.o. 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 Under rx with  carboplatin and paclitaxel .   Patient complaints of ongoing chornic neck pain back of neck on the left side for alomost "15 weeks" . Worse with laying down or movemnt. A.w. difficulty swallowing. Denies any trauma, weakness, vomting, vision changes.  Pain got progresively worse in the alst 2-3 weeks prompting the aptient to come in to the ER.  CT neck - IMPRESSION: 1. Progressive diffuse soft tissue mass in the posterior right lateral neck. This extends to the right C2-3 and C3-4 neural foramina. Irregular soft tissue surrounds the spinal cord at both levels. 2. A more discrete mass is present a posterior right spinal canal or possibly within the spinal cord at C2-3. This is similar to the December study but has progressed since the July study. MRI of the cervical spine without and with contrast would be useful for further evaluation. Large dental caries and periapical lucencies in the residual left maxillary molar.  Patient got iv morphine, dilaudid and valium in ER . Patinet reports no relieft in pain. Medical eval is sought.   Review of Systems: As mentioned in the history of present illness. All other systems reviewed and are negative. 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 He has never used smokeless tobacco. He reports that he does not drink alcohol and does not use drugs.  Allergies  Allergen Reactions   Phenergan [Promethazine Hcl] Anxiety    Causes patients HR to go into the 140's with severe anxiety   Heparin Other (See Comments)    No Pork derivatives due to religion   Clindamycin Rash    Family History  Problem Relation Age of Onset   Hypertension Mother    Diabetes Mother    Hypertension Father    Diabetes Father     Prior to Admission medications   Medication Sig Start Date End Date Taking? Authorizing Provider  bisacodyl (DULCOLAX) 10 MG suppository Place 1 suppository (10 mg total) rectally as needed for moderate constipation. 06/18/23   Artis Delay, MD  blood glucose meter kit and supplies KIT Dispense based on patient and insurance preference.  Use up to four times daily as directed. (FOR ICD-9 250.00, 250.01). 11/11/20   Artis Delay, MD  cetirizine (ZYRTEC) 10 MG tablet Take 1 tablet (10 mg total) by mouth daily as needed for allergies. 07/21/21   Artis Delay, MD  dexamethasone (DECADRON) 4 MG tablet Take 2 tablets by mouth the night before and 2 tablets the morning of chemotherapy, every 3 weeks x 6 cycles 09/21/23   Artis Delay, MD  famotidine (PEPCID) 40 MG tablet Take 1 tablet (40 mg total) by mouth at bedtime. 03/11/23   Artis Delay, MD  gabapentin (NEURONTIN) 100 MG capsule Take 2 capsules (200 mg  total) by mouth 2 (two) times daily. 10/22/23   Artis Delay, MD  levothyroxine (SYNTHROID) 175 MCG tablet Take 1 tablet (175 mcg total) by mouth daily before breakfast. 02/05/23   Artis Delay, MD  LORazepam (ATIVAN) 0.5 MG tablet Take 1 tablet (0.5 mg total) by mouth 2 (two) times daily as needed for anxiety. 12/27/23   Artis Delay, MD  metFORMIN (GLUCOPHAGE) 500 MG tablet Take 1 tablet (500 mg total) by mouth 2 (two) times daily with a meal. 01/29/23   Artis Delay, MD  methadone (DOLOPHINE) 10 MG tablet Take 1 tablet (10 mg) by mouth every 8 hours. 01/04/24   Artis Delay, MD  ondansetron (ZOFRAN) 8 MG tablet Take 1 tablet (8 mg total) by mouth every 8 (eight) hours as needed for nausea or vomiting. 08/20/23   Artis Delay, MD  Oxycodone HCl 10 MG TABS Take 1 tablet (10 mg total) by mouth every 4 (four) hours as needed. 12/30/23   Artis Delay, MD  pantoprazole (PROTONIX) 40 MG tablet Take 1 tablet (40 mg total) by mouth daily. 10/26/23   Artis Delay, MD  polyethylene glycol (MIRALAX / GLYCOLAX) 17 g packet Take 17 g by mouth 2 (two) times daily. 09/10/23   Artis Delay, MD  prochlorperazine (COMPAZINE) 10 MG tablet Take 1 tablet (10 mg total) by mouth every 6 (six) hours as needed for nausea or vomiting. 05/07/23   Artis Delay, MD  senna-docusate (SENOKOT-S) 8.6-50 MG tablet Take 2 tablets by mouth 2 (two) times daily. 11/20/22   Artis Delay, MD  tiZANidine (ZANAFLEX) 2 MG tablet Take 1 tablet (2 mg total) by mouth every 6 (six) hours as needed for muscle spasms. 11/18/23   Artis Delay, MD  venlafaxine XR (EFFEXOR-XR) 37.5 MG 24 hr capsule Take 1 capsule (37.5 mg total) by mouth daily with breakfast. 01/04/24   Artis Delay, MD    Physical Exam: Vitals:   01/07/24 1158 01/07/24 1203 01/07/24 1523 01/07/24 1845  BP: 112/64  (!) 144/91 (!) 132/92  Pulse: 82  60 82  Resp: 15  18 16   Temp: 98 F (36.7 C)  98.7 F (37.1 C)   TempSrc: Oral  Oral   SpO2: 97%  100% 100%  Weight:  77.2 kg    Height:  5\' 6"  (1.676 m)      General: Patient is alert and awake, pacing the room, due to uncontrolled pain.  Otherwise appears to be in no physiologic distress.  Gives a coherent account of his symptoms Respiratory exam: Bilateral intravesically Cardiovascular exam S1-S2 normal Abdomen soft nontender Extremities warm without edema Neck area is carefully examined, patient has poor oral dentition.  But no focal gumline tenderness.  Patient denies any tooth pain.  Patient has no trismus.  Patient does not move neck much because it is painful to him.  However on direct palpation there  is no focal tenderness identified.  Patient's pain is really deep.  There seems to be chronic skin changes from prior radiation therapy per patient.  Otherwise no acute changes such as erythema or boils are noted.  Data Reviewed:  Labs on Admission:  Results for orders placed or performed during the hospital encounter of 01/07/24 (from the past 24 hours)  CBC with Differential     Status: Abnormal   Collection Time: 01/07/24  1:19 PM  Result Value Ref Range   WBC 7.5 4.0 - 10.5 K/uL   RBC 3.84 (L) 4.22 - 5.81 MIL/uL   Hemoglobin 11.1 (L) 13.0 - 17.0 g/dL   HCT 29.5 (L) 28.4 - 13.2 %   MCV 85.7 80.0 - 100.0 fL   MCH 28.9 26.0 - 34.0 pg   MCHC 33.7 30.0 - 36.0 g/dL   RDW 44.0 (H) 10.2 - 72.5 %   Platelets 192 150 - 400 K/uL   nRBC 0.0 0.0 - 0.2 %   Neutrophils Relative % 80 %   Neutro Abs 6.0 1.7 - 7.7 K/uL   Lymphocytes Relative 16 %   Lymphs Abs 1.2 0.7 - 4.0 K/uL   Monocytes Relative 3 %   Monocytes Absolute 0.2 0.1 - 1.0 K/uL   Eosinophils Relative 0 %   Eosinophils Absolute 0.0 0.0 - 0.5 K/uL   Basophils Relative 0 %   Basophils Absolute 0.0 0.0 - 0.1 K/uL   Immature Granulocytes 1 %   Abs Immature Granulocytes 0.05 0.00 - 0.07 K/uL  Comprehensive metabolic panel     Status: Abnormal   Collection Time: 01/07/24  1:19 PM  Result Value Ref Range   Sodium 134 (L) 135 - 145 mmol/L   Potassium 3.3 (L) 3.5 - 5.1 mmol/L    Chloride 99 98 - 111 mmol/L   CO2 26 22 - 32 mmol/L   Glucose, Bld 102 (H) 70 - 99 mg/dL   BUN 22 (H) 6 - 20 mg/dL   Creatinine, Ser 3.66 0.61 - 1.24 mg/dL   Calcium 9.3 8.9 - 44.0 mg/dL   Total Protein 6.9 6.5 - 8.1 g/dL   Albumin 4.0 3.5 - 5.0 g/dL   AST 28 15 - 41 U/L   ALT 18 0 - 44 U/L   Alkaline Phosphatase 44 38 - 126 U/L   Total Bilirubin 0.6 0.0 - 1.2 mg/dL   GFR, Estimated >34 >74 mL/min   Anion gap 9 5 - 15   *Note: Due to a large number of results and/or encounters for the requested time period, some results have not been displayed. A complete set of results can be found in Results Review.   Basic Metabolic Panel: Recent Labs  Lab 01/04/24 0940 01/06/24 1550 01/07/24 1319  NA 136 131* 134*  K 3.9 3.5 3.3*  CL 102 97* 99  CO2 28 24 26   GLUCOSE 153* 105* 102*  BUN 15 20 22*  CREATININE 0.84 0.68 0.73  CALCIUM 9.7 9.0 9.3   Liver Function Tests: Recent Labs  Lab 01/04/24 0940 01/06/24 1550 01/07/24 1319  AST 26 27 28   ALT 15 17 18   ALKPHOS 57 47 44  BILITOT 0.4 0.6 0.6  PROT 7.6 7.0 6.9  ALBUMIN 4.7 4.0 4.0   No results for input(s): "LIPASE", "AMYLASE" in the last 168 hours. No results for input(s): "AMMONIA" in the last 168 hours. CBC: Recent Labs  Lab 01/04/24 0940 01/06/24 1550 01/07/24 1319  WBC 5.1 5.8 7.5  NEUTROABS 4.2 4.8 6.0  HGB 11.6* 11.1* 11.1*  HCT 35.3* 33.0* 32.9*  MCV 86.9 85.3 85.7  PLT 268 207 192   Cardiac Enzymes: No results for input(s): "CKTOTAL", "CKMB", "CKMBINDEX", "TROPONINIHS" in the last 168 hours.  BNP (last 3 results) No results for input(s): "PROBNP" in the last 8760 hours. CBG: No results for input(s): "GLUCAP" in the last 168 hours.  Radiological Exams on Admission:  CT Soft Tissue Neck W Contrast Result Date: 01/07/2024 CLINICAL DATA:  Non pulsatile neck mass. Personal history of head and neck cancer. Increasing pain and swelling. EXAM: CT NECK WITH CONTRAST TECHNIQUE: Multidetector CT imaging of the  neck was performed using the standard protocol following the bolus administration of intravenous contrast. RADIATION DOSE REDUCTION: This exam was performed according to the departmental dose-optimization program which includes automated exposure control, adjustment of the mA and/or kV according to patient size and/or use of iterative reconstruction technique. CONTRAST:  75mL OMNIPAQUE IOHEXOL 300 MG/ML  SOLN COMPARISON:  CT neck with contrast 10/04/2023 and 04/13/2023. FINDINGS: Pharynx and larynx: Nasopharynx is clear. Soft palate and tongue base are within normal limits. The oropharynx is clear. Vallecula and epiglottis are within normal limits. Aryepiglottic folds and piriform sinuses are clear. Vocal cords are midline and symmetric. Trachea is clear. Salivary glands: The submandibular glands are not discretely visualized. Parotid glands are within normal limits bilaterally. Thyroid: Atrophic Lymph nodes: No discrete adenopathy is present. 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 Minimal atherosclerotic changes are present at the carotid bifurcations without significant stenosis. Limited intracranial: Within normal limits. Visualized orbits: The globes and orbits are within normal limits. Mastoids and visualized paranasal sinuses: The paranasal sinuses and mastoid air cells are clear. Skeleton: Vertebral body heights and alignment are normal. No focal osseous lesions are present. Large dental caries and periapical lucencies are noted in the residual left maxillary molar. Upper chest: Scarring in the left upper lobe and postoperative changes are stable. Other: A progressive diffuse soft tissue mass present in the posterior right lateral neck. This extends to the right C2-3 and C3-4 neural foramina. Irregular soft tissue surrounds the spinal cord at both levels. A more discrete mass is present a posterior right spinal canal or possibly within the spinal cord at C2-3. This demonstrates scratched at this is  similar to the December study but has progressed since the July study. IMPRESSION: 1. Progressive diffuse soft tissue mass in the posterior right lateral neck. This extends to the right C2-3 and C3-4 neural foramina. Irregular soft tissue surrounds the spinal cord at both levels. 2. A more discrete mass is present a posterior right spinal canal or possibly within the spinal cord at C2-3. This is similar to the December study but has progressed since the July study. MRI of the cervical spine without and with contrast would be useful for further evaluation. 3. No discrete adenopathy. 4. Large dental caries and periapical lucencies in the residual left maxillary molar. 5. Stable scarring in the left upper lobe and postoperative changes. These results were called by telephone at the time of interpretation on 01/07/2024 at 4:39 pm to provider [facility] SMALL , who verbally acknowledged these results. Electronically Signed   By: Marin Roberts M.D.   On: 01/07/2024 16:50     No intake/output data recorded. No intake/output data recorded.        Assessment and Plan: * Neck pain See HPI, this seems to be chronic, however progression over the last several weeks.  Imaging is showing worsening of patient's prior known  head and neck cancer.  See CAT scan report above.  An MRI with and without contrast is pending.  Dr. Parke Poisson of oncology has already been engaged by ER provider.  We will follow-up with her.  With regards to current uncontrolled symptoms.  Patient reports no relief of symptoms with morphine or Dilaudid.  Therefore I will change the patient to fentanyl.  Changed patient's standing methadone to OxyContin as well as as needed oxycodone.  I will give the patient standing acetaminophen and Celebrex in case he is having any inflammation as well as baclofen standing as patient seems to be having some spasm of the neck.  At this time clinically speaking there is no focal weakness.  Patient is not reporting  any severe headache or vomiting to suggest increased intracranial tension.  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   Med rec pending pharmcy input.   Advance Care Planning:   Code Status: Full Code   Consults: Dr. Mosetta Putt of Onc  Family Communication: per patient  Severity of Illness: The appropriate patient status for this patient is OBSERVATION. Observation status is judged to be reasonable and necessary in order to provide the required intensity of service to ensure the patient's safety. The patient's presenting symptoms, physical exam findings, and initial radiographic and laboratory data in the context of their medical condition is felt to place them at decreased risk for further clinical deterioration. Furthermore, it is anticipated that the patient will be medically stable for discharge from the hospital within 2 midnights of admission.   Author: Nolberto Hanlon, MD 01/07/2024 7:06 PM  For on call review [email or web address].

## 2024-01-07 NOTE — Telephone Encounter (Signed)
 Returned his call. He left a message that he "cannot breathe". Called back and left a message to call 911 now.  Called wife and told her that Christian Simmons called and left a message that he could not breathe. When the office called back he did not answer. Told her I left a message to call 911. Wife will trying calling Christian Simmons and leave work if she cannot get him.

## 2024-01-07 NOTE — Assessment & Plan Note (Signed)
 See HPI, this seems to be chronic, however progression over the last several weeks.  Imaging is showing worsening of patient's prior known head and neck cancer.  See CAT scan report above.  An MRI with and without contrast is pending.  Dr. Parke Poisson of oncology has already been engaged by ER provider.  We will follow-up with her.  With regards to current uncontrolled symptoms.  Patient reports no relief of symptoms with morphine or Dilaudid.  Therefore I will change the patient to fentanyl.  Changed patient's standing methadone to OxyContin as well as as needed oxycodone.  I will give the patient standing acetaminophen and Celebrex in case he is having any inflammation as well as baclofen standing as patient seems to be having some spasm of the neck.  At this time clinically speaking there is no focal weakness.  Patient is not reporting any severe headache or vomiting to suggest increased intracranial tension.  We will monitor this clinically and follow-up MRI that is pending

## 2024-01-07 NOTE — Telephone Encounter (Signed)
 Called him back and instructed to call 911. He is going to the ER now. Spoke with his wife earlier and she said that she leaving work to check on him.  FYI

## 2024-01-07 NOTE — ED Triage Notes (Signed)
 Patient arrives with complaints of increased pain to neck x3 days, left side. Current diagnosis of neck cancer. Last chemo was a few days ago. Patient endorses he feels anxious. Rates pain a 10/10.

## 2024-01-07 NOTE — ED Notes (Signed)
 Patient states he would like to take his Valium after CT imaging is done.

## 2024-01-07 NOTE — Assessment & Plan Note (Signed)
 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

## 2024-01-08 DIAGNOSIS — C760 Malignant neoplasm of head, face and neck: Principal | ICD-10-CM

## 2024-01-08 DIAGNOSIS — M542 Cervicalgia: Secondary | ICD-10-CM

## 2024-01-08 DIAGNOSIS — C770 Secondary and unspecified malignant neoplasm of lymph nodes of head, face and neck: Secondary | ICD-10-CM

## 2024-01-08 DIAGNOSIS — Z515 Encounter for palliative care: Secondary | ICD-10-CM

## 2024-01-08 LAB — BASIC METABOLIC PANEL WITH GFR
Anion gap: 10 (ref 5–15)
BUN: 19 mg/dL (ref 6–20)
CO2: 26 mmol/L (ref 22–32)
Calcium: 8.9 mg/dL (ref 8.9–10.3)
Chloride: 98 mmol/L (ref 98–111)
Creatinine, Ser: 0.78 mg/dL (ref 0.61–1.24)
GFR, Estimated: >60 mL/min (ref >60)
Glucose, Bld: 97 mg/dL (ref 70–99)
Potassium: 3.4 mmol/L — ABNORMAL LOW (ref 3.5–5.1)
Sodium: 134 mmol/L — ABNORMAL LOW (ref 135–145)

## 2024-01-08 LAB — CBC
HCT: 34 % — ABNORMAL LOW (ref 39.0–52.0)
Hemoglobin: 11.1 g/dL — ABNORMAL LOW (ref 13.0–17.0)
MCH: 28.3 pg (ref 26.0–34.0)
MCHC: 32.6 g/dL (ref 30.0–36.0)
MCV: 86.7 fL (ref 80.0–100.0)
Platelets: 167 10*3/uL (ref 150–400)
RBC: 3.92 MIL/uL — ABNORMAL LOW (ref 4.22–5.81)
RDW: 17.5 % — ABNORMAL HIGH (ref 11.5–15.5)
WBC: 5.1 10*3/uL (ref 4.0–10.5)
nRBC: 0 % (ref 0.0–0.2)

## 2024-01-08 LAB — APTT: aPTT: 28 s (ref 24–36)

## 2024-01-08 LAB — PROTIME-INR
INR: 1.3 — ABNORMAL HIGH (ref 0.8–1.2)
Prothrombin Time: 16 s — ABNORMAL HIGH (ref 11.4–15.2)

## 2024-01-08 LAB — HIV ANTIBODY (ROUTINE TESTING W REFLEX): HIV Screen 4th Generation wRfx: NONREACTIVE

## 2024-01-08 MED ORDER — HYDROMORPHONE 1 MG/ML IV SOLN
INTRAVENOUS | Status: DC
  Administered 2024-01-08: 1.8 mg via INTRAVENOUS
  Administered 2024-01-08: 30 mg via INTRAVENOUS
  Administered 2024-01-09: 0.6 mg via INTRAVENOUS
  Administered 2024-01-09: 1.2 mg via INTRAVENOUS
  Administered 2024-01-09: 1.5 mg via INTRAVENOUS
  Administered 2024-01-09: 1.8 mg via INTRAVENOUS
  Administered 2024-01-09: 1.5 mg via INTRAVENOUS
  Administered 2024-01-10: 1.8 mg via INTRAVENOUS
  Administered 2024-01-10: 0.9 mg via INTRAVENOUS
  Administered 2024-01-11: 1.8 mg via INTRAVENOUS
  Administered 2024-01-11 (×2): 2.1 mg via INTRAVENOUS
  Filled 2024-01-08: qty 30

## 2024-01-08 MED ORDER — BISACODYL 10 MG RE SUPP: 10.0000 mg | Freq: Every day | RECTAL | Status: DC | PRN

## 2024-01-08 MED ORDER — POLYETHYLENE GLYCOL 3350 17 G PO PACK
17.0000 g | PACK | Freq: Every day | ORAL | Status: DC
  Filled 2024-01-08 (×2): qty 1

## 2024-01-08 MED ORDER — POTASSIUM CHLORIDE 10 MEQ/100ML IV SOLN
10.0000 meq | INTRAVENOUS | Status: AC
  Administered 2024-01-08 (×2): 10 meq via INTRAVENOUS
  Filled 2024-01-08 (×2): qty 100

## 2024-01-08 MED ORDER — NICOTINE 14 MG/24HR TD PT24
14.0000 mg | MEDICATED_PATCH | Freq: Every day | TRANSDERMAL | Status: DC
  Administered 2024-01-08 – 2024-01-11 (×4): 14 mg via TRANSDERMAL
  Filled 2024-01-08 (×4): qty 1

## 2024-01-08 MED ORDER — HYDROMORPHONE HCL 1 MG/ML IJ SOLN
1.0000 mg | INTRAMUSCULAR | Status: DC | PRN
  Administered 2024-01-08: 2 mg via INTRAVENOUS
  Filled 2024-01-08: qty 2

## 2024-01-08 MED ORDER — METHADONE HCL 10 MG PO TABS
10.0000 mg | ORAL_TABLET | Freq: Three times a day (TID) | ORAL | Status: DC
  Administered 2024-01-08 – 2024-01-11 (×7): 10 mg via ORAL
  Filled 2024-01-08 (×7): qty 1

## 2024-01-08 MED ORDER — DEXAMETHASONE SODIUM PHOSPHATE 4 MG/ML IJ SOLN
8.0000 mg | Freq: Two times a day (BID) | INTRAMUSCULAR | Status: DC
  Administered 2024-01-08: 8 mg via INTRAVENOUS
  Filled 2024-01-08 (×3): qty 2

## 2024-01-08 MED ORDER — OXYCODONE HCL ER 10 MG PO T12A: 10.0000 mg | EXTENDED_RELEASE_TABLET | Freq: Two times a day (BID) | ORAL | Status: DC

## 2024-01-08 MED ORDER — SODIUM CHLORIDE 0.9% FLUSH: 9.0000 mL | INTRAVENOUS | Status: DC | PRN

## 2024-01-08 MED ORDER — LORAZEPAM 2 MG/ML IJ SOLN
0.5000 mg | Freq: Four times a day (QID) | INTRAMUSCULAR | Status: DC | PRN
  Administered 2024-01-08 – 2024-01-09 (×3): 0.5 mg via INTRAVENOUS
  Filled 2024-01-08 (×3): qty 1

## 2024-01-08 MED ORDER — DIPHENHYDRAMINE HCL 12.5 MG/5ML PO ELIX
12.5000 mg | ORAL_SOLUTION | Freq: Four times a day (QID) | ORAL | Status: DC | PRN
  Filled 2024-01-08: qty 5

## 2024-01-08 MED ORDER — NALOXONE HCL 0.4 MG/ML IJ SOLN: 0.4000 mg | INTRAMUSCULAR | Status: DC | PRN

## 2024-01-08 MED ORDER — ONDANSETRON HCL 4 MG/2ML IJ SOLN
4.0000 mg | Freq: Four times a day (QID) | INTRAMUSCULAR | Status: DC | PRN
  Administered 2024-01-08: 4 mg via INTRAVENOUS
  Filled 2024-01-08: qty 2

## 2024-01-08 MED ORDER — ONDANSETRON HCL 4 MG/2ML IJ SOLN: 4.0000 mg | Freq: Four times a day (QID) | INTRAMUSCULAR | Status: DC | PRN

## 2024-01-08 MED ORDER — LORAZEPAM 0.5 MG PO TABS
0.5000 mg | ORAL_TABLET | Freq: Two times a day (BID) | ORAL | Status: DC | PRN
  Administered 2024-01-08: 0.5 mg via ORAL
  Filled 2024-01-08: qty 1

## 2024-01-08 MED ORDER — DIPHENHYDRAMINE HCL 50 MG/ML IJ SOLN
12.5000 mg | Freq: Four times a day (QID) | INTRAMUSCULAR | Status: DC | PRN
  Administered 2024-01-08 – 2024-01-11 (×6): 12.5 mg via INTRAVENOUS
  Filled 2024-01-08 (×6): qty 1

## 2024-01-08 MED ORDER — HYDROMORPHONE HCL 1 MG/ML IJ SOLN
1.0000 mg | INTRAMUSCULAR | Status: DC | PRN
  Administered 2024-01-08: 1 mg via INTRAVENOUS
  Filled 2024-01-08: qty 1

## 2024-01-08 MED ORDER — POTASSIUM CHLORIDE 20 MEQ PO PACK
40.0000 meq | PACK | Freq: Once | ORAL | Status: DC
  Filled 2024-01-08: qty 2

## 2024-01-08 NOTE — Progress Notes (Signed)
 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  On presentation, CT of neck showed progressive diffuse soft tissue mass in the posterior right lateral neck surrounding the spinal cord with recommendations of MRI of cervical spine.  Patient received IV analgesics in the ED but continued to have pain.  Assessment & Plan:   Severe neck pain Recurrent head and neck cancer with history of metastatic disease to the lungs treated with chemotherapy Cancer associated pain -Presented with worsening neck pain. -CT of neck showed progressive diffuse soft tissue mass in the posterior right lateral neck surrounding the spinal cord with recommendations of MRI of cervical spine.  MRI of cervical spine showed findings concerning for metastatic disease or direct invasion of the foraminal/epidural process - Continue current pain management.  Awaiting oncology recommendations.  Anemia of chronic disease - From cancer.  Hemoglobin currently stable.  No signs of bleeding.  Monitor  Hyponatremia - Mild.  Monitor.  Encourage oral intake  Hypokalemia -Mild.  Replace.  Repeat a.m. labs.  Anxiety - Continue as needed Ativan  DVT prophylaxis: DC Xarelto in case he needs surgical intervention.  Start SCDs Code Status: Full Family Communication: None at bedside Disposition Plan: Status is: Inpatient Remains inpatient appropriate because: Of severity of illness    Consultants: Oncology  Procedures: None  Antimicrobials: None   Subjective: Patient seen and examined at bedside.  Continues to have severe neck pain.  Complain of nausea and vomiting.  No chest pain or shortness  of breath reported.  Objective: Vitals:   01/07/24 2025 01/08/24 0028 01/08/24 0500 01/08/24 0828  BP: (!) 147/93 123/87 (!) 128/90 123/81  Pulse: 98 74 87 70  Resp: 16 14 14 18   Temp: (!) 97.5 F (36.4 C) 98.2 F (36.8 C) 98.2 F (36.8 C) 98.8 F (37.1 C)  TempSrc: Oral Oral Oral Oral  SpO2: 100% 99% 98% 100%  Weight: 75.4 kg     Height:       No intake or output data in the 24 hours ending 01/08/24 0836 Filed Weights   01/07/24 1203 01/07/24 2025  Weight: 77.2 kg 75.4 kg    Examination:  General exam: Appears calm and comfortable.  Looks chronically ill and deconditioned.   Respiratory system: Bilateral decreased breath sounds at bases Cardiovascular system: S1 & S2 heard, Rate controlled Gastrointestinal system: Abdomen is nondistended, soft and nontender. Normal bowel sounds heard. Extremities: No cyanosis, clubbing, edema  Central nervous system: Alert and oriented.  Slow to respond.  No focal neurological deficits. Moving extremities Skin: No rashes, lesions or ulcers Psychiatry: Flat affect.  Not agitated.    Data Reviewed: I have personally reviewed following labs and imaging studies  CBC: Recent Labs  Lab 01/04/24 0940 01/06/24 1550 01/07/24 1319 01/08/24 0532  WBC 5.1 5.8 7.5 5.1  NEUTROABS 4.2 4.8 6.0  --   HGB 11.6* 11.1* 11.1* 11.1*  HCT 35.3* 33.0* 32.9* 34.0*  MCV 86.9 85.3 85.7 86.7  PLT 268 207 192 167   Basic Metabolic Panel: Recent Labs  Lab 01/04/24 0940 01/06/24 1550 01/07/24 1319 01/08/24 0532  NA 136 131* 134* 134*  K 3.9 3.5 3.3* 3.4*  CL 102 97* 99 98  CO2 28 24 26 26   GLUCOSE 153* 105* 102* 97  BUN 15 20 22* 19  CREATININE 0.84 0.68 0.73 0.78  CALCIUM 9.7 9.0 9.3 8.9   GFR: Estimated Creatinine Clearance: 113 mL/min (by C-G formula based on SCr of 0.78 mg/dL). Liver Function Tests: Recent Labs  Lab 01/04/24 0940 01/06/24 1550 01/07/24 1319  AST 26 27 28   ALT 15 17 18   ALKPHOS 57 47 44  BILITOT 0.4 0.6 0.6  PROT  7.6 7.0 6.9  ALBUMIN 4.7 4.0 4.0   No results for input(s): "LIPASE", "AMYLASE" in the last 168 hours. No results for input(s): "AMMONIA" in the last 168 hours. Coagulation Profile: Recent Labs  Lab 01/08/24 0532  INR 1.3*   Cardiac Enzymes: No results for input(s): "CKTOTAL", "CKMB", "CKMBINDEX", "TROPONINI" in the last 168 hours. BNP (last 3 results) No results for input(s): "PROBNP" in the last 8760 hours. HbA1C: No results for input(s): "HGBA1C" in the last 72 hours. CBG: No results for input(s): "GLUCAP" in the last 168 hours. Lipid Profile: No results for input(s): "CHOL", "HDL", "LDLCALC", "TRIG", "CHOLHDL", "LDLDIRECT" in the last 72 hours. Thyroid Function Tests: No results for input(s): "TSH", "T4TOTAL", "FREET4", "T3FREE", "THYROIDAB" in the last 72 hours. Anemia Panel: No results for input(s): "VITAMINB12", "FOLATE", "FERRITIN", "TIBC", "IRON", "RETICCTPCT" in the last 72 hours. Sepsis Labs: No results for input(s): "PROCALCITON", "LATICACIDVEN" in the last 168 hours.  Recent Results (from the past 240 hours)  Group A Strep by PCR     Status: None   Collection Time: 01/06/24  3:51 PM   Specimen: Throat; Sterile Swab  Result Value Ref Range Status   Group A Strep by PCR NOT DETECTED NOT DETECTED Final    Comment: Performed at [facility], [address] [place], [place] [address]  Resp panel by RT-PCR (RSV, Flu A&B, Covid) Anterior Nasal Swab     Status: None   Collection Time: 01/06/24  3:51 PM   Specimen: Anterior Nasal Swab  Result Value Ref Range Status   SARS Coronavirus 2 by RT PCR NEGATIVE NEGATIVE Final    Comment: (NOTE) SARS-CoV-2 target nucleic acids are NOT DETECTED.  The SARS-CoV-2 RNA is generally detectable in upper respiratory specimens during the acute phase of infection. The lowest concentration of SARS-CoV-2 viral copies this assay can detect is 138 copies/mL. A negative result does not preclude SARS-Cov-2 infection and should  not be used as the sole basis for treatment or other patient management decisions. A negative result may occur with  improper specimen collection/handling, submission of specimen other than nasopharyngeal swab, presence of viral mutation(s) within the areas targeted by this assay, and inadequate number of viral copies(<138 copies/mL). A negative result must be combined with clinical observations, patient history, and epidemiological information. The expected result is Negative.  Fact Sheet for Patients:  BloggerCourse.com  Fact Sheet for Healthcare Providers:  SeriousBroker.it  This test is no t yet approved or cleared by the [place] FDA and  has been authorized for detection and/or diagnosis of SARS-CoV-2 by FDA under an Emergency Use Authorization (EUA). This EUA will remain  in effect (meaning this test can be used) for the duration of the COVID-19 declaration under Section 564(b)(1) of the Act, 21 U.S.C.section 360bbb-3(b)(1), unless the authorization is terminated  or revoked sooner.       Influenza A by PCR NEGATIVE NEGATIVE Final   Influenza B by PCR NEGATIVE NEGATIVE Final    Comment: (NOTE) The Xpert Xpress SARS-CoV-2/FLU/RSV  plus assay is intended as an aid in the diagnosis of influenza from Nasopharyngeal swab specimens and should not be used as a sole basis for treatment. Nasal washings and aspirates are unacceptable for Xpert Xpress SARS-CoV-2/FLU/RSV testing.  Fact Sheet for Patients: BloggerCourse.com  Fact Sheet for Healthcare Providers: SeriousBroker.it  This test is not yet approved or cleared by the [place] FDA and has been authorized for detection and/or diagnosis of SARS-CoV-2 by FDA under an Emergency Use Authorization (EUA). This EUA will remain in effect (meaning this test can be used) for the duration of the COVID-19 declaration under  Section 564(b)(1) of the Act, 21 U.S.C. section 360bbb-3(b)(1), unless the authorization is terminated or revoked.     Resp Syncytial Virus by PCR NEGATIVE NEGATIVE Final    Comment: (NOTE) Fact Sheet for Patients: BloggerCourse.com  Fact Sheet for Healthcare Providers: SeriousBroker.it  This test is not yet approved or cleared by the [place] FDA and has been authorized for detection and/or diagnosis of SARS-CoV-2 by FDA under an Emergency Use Authorization (EUA). This EUA will remain in effect (meaning this test can be used) for the duration of the COVID-19 declaration under Section 564(b)(1) of the Act, 21 U.S.C. section 360bbb-3(b)(1), unless the authorization is terminated or revoked.  Performed at [facility], [address], [place], [place] [address]          Radiology Studies: MR Cervical Spine W and Wo Contrast Result Date: 01/07/2024 CLINICAL DATA:  Acute neck pain.  Known malignancy. EXAM: MRI CERVICAL SPINE WITHOUT AND WITH CONTRAST TECHNIQUE: Multiplanar and multiecho pulse sequences of the cervical spine, to include the craniocervical junction and cervicothoracic junction, were obtained without and with intravenous contrast. CONTRAST:  7mL GADAVIST GADOBUTROL 1 MMOL/ML IV SOLN COMPARISON:  CT neck 01/07/2024 FINDINGS: Alignment: Physiologic. Vertebrae: No fracture, evidence of discitis, or bone lesion. Cord: There is abnormal hyperintense T2-weighted signal within the cervical spinal cord extending from C2-C5. There is intramedullary abnormal contrast enhancement within the spinal cord at the C2-3 level, measuring 0.8 x 0.7 x 1.5 cm. There is ventral and dorsal epidural contrast enhancement at the C1-4 levels. Posterior Fossa, vertebral arteries, paraspinal tissues: Mass within the right cervical soft tissues at the C3-4 level is better appreciated on the earlier CT. This mass likely extends through the  right C3 neural foramen. Disc levels: No disc herniation, spinal canal stenosis or neural foraminal stenosis. The thecal sac is effaced at the levels of the above described epidural contrast enhancement. IMPRESSION: 1. Intramedullary abnormal contrast enhancement within the cervical spinal cord at the C2-3 level, measuring 0.8 x 0.7 x 1.5 cm, with abnormal hyperintense T2-weighted signal within the cervical spinal cord extending from C2-C5. Given history of head and neck carcinoma, this is concerning for metastatic disease or direct invasion of the foraminal/epidural process described below. 2. Ventral and dorsal epidural contrast enhancement at the C1-4 levels, concerning for epidural spread of disease. The soft tissue lis in of the right neck likely extends through the right C3 neural foramen to affect the epidural space. 3. Mass within the right cervical soft tissues at the C3-4 level is better appreciated on the earlier CT. Electronically Signed   By: Deatra Robinson M.D.   On: 01/07/2024 21:28   CT Soft Tissue Neck W Contrast Result Date: 01/07/2024 CLINICAL DATA:  Non pulsatile neck mass. Personal history of head and neck cancer. Increasing pain and swelling. EXAM: CT NECK WITH CONTRAST TECHNIQUE: Multidetector CT imaging of the  neck was performed using the standard protocol following the bolus administration of intravenous contrast. RADIATION DOSE REDUCTION: This exam was performed according to the departmental dose-optimization program which includes automated exposure control, adjustment of the mA and/or kV according to patient size and/or use of iterative reconstruction technique. CONTRAST:  75mL OMNIPAQUE IOHEXOL 300 MG/ML  SOLN COMPARISON:  CT neck with contrast 10/04/2023 and 04/13/2023. FINDINGS: Pharynx and larynx: Nasopharynx is clear. Soft palate and tongue base are within normal limits. The oropharynx is clear. Vallecula and epiglottis are within normal limits. Aryepiglottic folds and piriform  sinuses are clear. Vocal cords are midline and symmetric. Trachea is clear. Salivary glands: The submandibular glands are not discretely visualized. Parotid glands are within normal limits bilaterally. Thyroid: Atrophic Lymph nodes: No discrete adenopathy is present. 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 Minimal atherosclerotic changes are present at the carotid bifurcations without significant stenosis. Limited intracranial: Within normal limits. Visualized orbits: The globes and orbits are within normal limits. Mastoids and visualized paranasal sinuses: The paranasal sinuses and mastoid air cells are clear. Skeleton: Vertebral body heights and alignment are normal. No focal osseous lesions are present. Large dental caries and periapical lucencies are noted in the residual left maxillary molar. Upper chest: Scarring in the left upper lobe and postoperative changes are stable. Other: A progressive diffuse soft tissue mass present in the posterior right lateral neck. This extends to the right C2-3 and C3-4 neural foramina. Irregular soft tissue surrounds the spinal cord at both levels. A more discrete mass is present a posterior right spinal canal or possibly within the spinal cord at C2-3. This demonstrates scratched at this is similar to the December study but has progressed since the July study. IMPRESSION: 1. Progressive diffuse soft tissue mass in the posterior right lateral neck. This extends to the right C2-3 and C3-4 neural foramina. Irregular soft tissue surrounds the spinal cord at both levels. 2. A more discrete mass is present a posterior right spinal canal or possibly within the spinal cord at C2-3. This is similar to the December study but has progressed since the July study. MRI of the cervical spine without and with contrast would be useful for further evaluation. 3. No discrete adenopathy. 4. Large dental caries and periapical lucencies in the residual left maxillary molar. 5. Stable scarring  in the left upper lobe and postoperative changes. These results were called by telephone at the time of interpretation on 01/07/2024 at 4:39 pm to provider [facility] , who verbally acknowledged these results. Electronically Signed   By: Marin Roberts M.D.   On: 01/07/2024 16:50        Scheduled Meds:  acetaminophen  650 mg Oral Q4H   baclofen  5 mg Oral TID   celecoxib  200 mg Oral BID   feeding supplement  237 mL Oral BID BM   oxyCODONE  10 mg Oral Q12H   rivaroxaban  10 mg Oral QHS   senna-docusate  2 tablet Oral BID   sodium chloride flush  3 mL Intravenous Q12H   Continuous Infusions:        Glade Lloyd, MD [facility] 01/08/2024, 8:36 AM

## 2024-01-08 NOTE — Progress Notes (Signed)
 Christian Simmons   DOB:[birth year]   DZ#:[id number]   TMH#:[id number]  Medical oncology follow-up note  Subjective: Patient is well-known to our service, he is under the care of Dr. Quita Skye chief for metastatic head neck cancer.  He was initially diagnosed about 20 years ago, subsequently developed metastatic disease to bilateral lungs.  He is currently on chemotherapy carboplatin and paclitaxel, with last dose January 04, 2024.  He presented to the emergency room with worsening neck pain.  CT and MRI showed intramedullary abnormality contrast-enhancement within the cervical spine cord, extending from C2-C5.  This is concerning for metastatic disease and direct invasion of the epidural process.  He denies significant weakness or paresthesia on his hands.   Objective:  Vitals:   01/08/24 0500 01/08/24 0828  BP: (!) 128/90 123/81  Pulse: 87 70  Resp: 14 18  Temp: 98.2 F (36.8 C) 98.8 F (37.1 C)  SpO2: 98% 100%    Body mass index is 26.83 kg/m.  Intake/Output Summary (Last 24 hours) at 01/08/2024 1335 Last data filed at 01/08/2024 1008 Gross per 24 hour  Intake 3 ml  Output --  Net 3 ml     Sclerae unicteric  Oropharynx clear  Neck some fullness, no discrete mass  Lungs clear -- no rales or rhonchi  Heart regular rate and rhythm  Abdomen benign  MSK no focal spinal tenderness, no peripheral edema  Neuro nonfocal    CBG (last 3)  No results for input(s): "GLUCAP" in the last 72 hours.   Labs:  Lab Results  Component Value Date   WBC 5.1 01/08/2024   HGB 11.1 (L) 01/08/2024   HCT 34.0 (L) 01/08/2024   MCV 86.7 01/08/2024   PLT 167 01/08/2024   NEUTROABS 6.0 01/07/2024     Urine Studies No results for input(s): "UHGB", "CRYS" in the last 72 hours.  Invalid input(s): "UACOL", "UAPR", "USPG", "UPH", "UTP", "UGL", "UKET", "UBIL", "UNIT", "UROB", "ULEU", "UEPI", "UWBC", "URBC", "UBAC", "CAST", "UCOM", "BILUA"  Basic Metabolic Panel: Recent Labs  Lab 01/04/24 0940  01/06/24 1550 01/07/24 1319 01/08/24 0532  NA 136 131* 134* 134*  K 3.9 3.5 3.3* 3.4*  CL 102 97* 99 98  CO2 28 24 26 26   GLUCOSE 153* 105* 102* 97  BUN 15 20 22* 19  CREATININE 0.84 0.68 0.73 0.78  CALCIUM 9.7 9.0 9.3 8.9   GFR Estimated Creatinine Clearance: 113 mL/min (by C-G formula based on SCr of 0.78 mg/dL). Liver Function Tests: Recent Labs  Lab 01/04/24 0940 01/06/24 1550 01/07/24 1319  AST 26 27 28   ALT 15 17 18   ALKPHOS 57 47 44  BILITOT 0.4 0.6 0.6  PROT 7.6 7.0 6.9  ALBUMIN 4.7 4.0 4.0   No results for input(s): "LIPASE", "AMYLASE" in the last 168 hours. No results for input(s): "AMMONIA" in the last 168 hours. Coagulation profile Recent Labs  Lab 01/08/24 0532  INR 1.3*    CBC: Recent Labs  Lab 01/04/24 0940 01/06/24 1550 01/07/24 1319 01/08/24 0532  WBC 5.1 5.8 7.5 5.1  NEUTROABS 4.2 4.8 6.0  --   HGB 11.6* 11.1* 11.1* 11.1*  HCT 35.3* 33.0* 32.9* 34.0*  MCV 86.9 85.3 85.7 86.7  PLT 268 207 192 167   Cardiac Enzymes: No results for input(s): "CKTOTAL", "CKMB", "CKMBINDEX", "TROPONINI" in the last 168 hours. BNP: Invalid input(s): "POCBNP" CBG: No results for input(s): "GLUCAP" in the last 168 hours. D-Dimer No results for input(s): "DDIMER" in the last 72 hours. Hgb A1c No results for  input(s): "HGBA1C" in the last 72 hours. Lipid Profile No results for input(s): "CHOL", "HDL", "LDLCALC", "TRIG", "CHOLHDL", "LDLDIRECT" in the last 72 hours. Thyroid function studies No results for input(s): "TSH", "T4TOTAL", "T3FREE", "THYROIDAB" in the last 72 hours.  Invalid input(s): "FREET3" Anemia work up No results for input(s): "VITAMINB12", "FOLATE", "FERRITIN", "TIBC", "IRON", "RETICCTPCT" in the last 72 hours. Microbiology Recent Results (from the past 240 hours)  Group A Strep by PCR     Status: None   Collection Time: 01/06/24  3:51 PM   Specimen: Throat; Sterile Swab  Result Value Ref Range Status   Group A Strep by PCR NOT DETECTED  NOT DETECTED Final    Comment: Performed at [facility], [address] [place], [place] [address]  Resp panel by RT-PCR (RSV, Flu A&B, Covid) Anterior Nasal Swab     Status: None   Collection Time: 01/06/24  3:51 PM   Specimen: Anterior Nasal Swab  Result Value Ref Range Status   SARS Coronavirus 2 by RT PCR NEGATIVE NEGATIVE Final    Comment: (NOTE) SARS-CoV-2 target nucleic acids are NOT DETECTED.  The SARS-CoV-2 RNA is generally detectable in upper respiratory specimens during the acute phase of infection. The lowest concentration of SARS-CoV-2 viral copies this assay can detect is 138 copies/mL. A negative result does not preclude SARS-Cov-2 infection and should not be used as the sole basis for treatment or other patient management decisions. A negative result may occur with  improper specimen collection/handling, submission of specimen other than nasopharyngeal swab, presence of viral mutation(s) within the areas targeted by this assay, and inadequate number of viral copies(<138 copies/mL). A negative result must be combined with clinical observations, patient history, and epidemiological information. The expected result is Negative.  Fact Sheet for Patients:  BloggerCourse.com  Fact Sheet for Healthcare Providers:  SeriousBroker.it  This test is no t yet approved or cleared by the [place] FDA and  has been authorized for detection and/or diagnosis of SARS-CoV-2 by FDA under an Emergency Use Authorization (EUA). This EUA will remain  in effect (meaning this test can be used) for the duration of the COVID-19 declaration under Section 564(b)(1) of the Act, 21 U.S.C.section 360bbb-3(b)(1), unless the authorization is terminated  or revoked sooner.       Influenza A by PCR NEGATIVE NEGATIVE Final   Influenza B by PCR NEGATIVE NEGATIVE Final    Comment: (NOTE) The Xpert Xpress SARS-CoV-2/FLU/RSV plus assay  is intended as an aid in the diagnosis of influenza from Nasopharyngeal swab specimens and should not be used as a sole basis for treatment. Nasal washings and aspirates are unacceptable for Xpert Xpress SARS-CoV-2/FLU/RSV testing.  Fact Sheet for Patients: BloggerCourse.com  Fact Sheet for Healthcare Providers: SeriousBroker.it  This test is not yet approved or cleared by the [place] FDA and has been authorized for detection and/or diagnosis of SARS-CoV-2 by FDA under an Emergency Use Authorization (EUA). This EUA will remain in effect (meaning this test can be used) for the duration of the COVID-19 declaration under Section 564(b)(1) of the Act, 21 U.S.C. section 360bbb-3(b)(1), unless the authorization is terminated or revoked.     Resp Syncytial Virus by PCR NEGATIVE NEGATIVE Final    Comment: (NOTE) Fact Sheet for Patients: BloggerCourse.com  Fact Sheet for Healthcare Providers: SeriousBroker.it  This test is not yet approved or cleared by the [place] FDA and has been authorized for detection and/or diagnosis of SARS-CoV-2 by FDA under an Emergency Use  Authorization (EUA). This EUA will remain in effect (meaning this test can be used) for the duration of the COVID-19 declaration under Section 564(b)(1) of the Act, 21 U.S.C. section 360bbb-3(b)(1), unless the authorization is terminated or revoked.  Performed at [facility], [address], [place], [place] [address]       Studies:  MR Cervical Spine W and Wo Contrast Result Date: 01/07/2024 CLINICAL DATA:  Acute neck pain.  Known malignancy. EXAM: MRI CERVICAL SPINE WITHOUT AND WITH CONTRAST TECHNIQUE: Multiplanar and multiecho pulse sequences of the cervical spine, to include the craniocervical junction and cervicothoracic junction, were obtained without and with intravenous contrast. CONTRAST:   7mL GADAVIST GADOBUTROL 1 MMOL/ML IV SOLN COMPARISON:  CT neck 01/07/2024 FINDINGS: Alignment: Physiologic. Vertebrae: No fracture, evidence of discitis, or bone lesion. Cord: There is abnormal hyperintense T2-weighted signal within the cervical spinal cord extending from C2-C5. There is intramedullary abnormal contrast enhancement within the spinal cord at the C2-3 level, measuring 0.8 x 0.7 x 1.5 cm. There is ventral and dorsal epidural contrast enhancement at the C1-4 levels. Posterior Fossa, vertebral arteries, paraspinal tissues: Mass within the right cervical soft tissues at the C3-4 level is better appreciated on the earlier CT. This mass likely extends through the right C3 neural foramen. Disc levels: No disc herniation, spinal canal stenosis or neural foraminal stenosis. The thecal sac is effaced at the levels of the above described epidural contrast enhancement. IMPRESSION: 1. Intramedullary abnormal contrast enhancement within the cervical spinal cord at the C2-3 level, measuring 0.8 x 0.7 x 1.5 cm, with abnormal hyperintense T2-weighted signal within the cervical spinal cord extending from C2-C5. Given history of head and neck carcinoma, this is concerning for metastatic disease or direct invasion of the foraminal/epidural process described below. 2. Ventral and dorsal epidural contrast enhancement at the C1-4 levels, concerning for epidural spread of disease. The soft tissue lis in of the right neck likely extends through the right C3 neural foramen to affect the epidural space. 3. Mass within the right cervical soft tissues at the C3-4 level is better appreciated on the earlier CT. Electronically Signed   By: Deatra Robinson M.D.   On: 01/07/2024 21:28   CT Soft Tissue Neck W Contrast Result Date: 01/07/2024 CLINICAL DATA:  Non pulsatile neck mass. Personal history of head and neck cancer. Increasing pain and swelling. EXAM: CT NECK WITH CONTRAST TECHNIQUE: Multidetector CT imaging of the neck was  performed using the standard protocol following the bolus administration of intravenous contrast. RADIATION DOSE REDUCTION: This exam was performed according to the departmental dose-optimization program which includes automated exposure control, adjustment of the mA and/or kV according to patient size and/or use of iterative reconstruction technique. CONTRAST:  75mL OMNIPAQUE IOHEXOL 300 MG/ML  SOLN COMPARISON:  CT neck with contrast 10/04/2023 and 04/13/2023. FINDINGS: Pharynx and larynx: Nasopharynx is clear. Soft palate and tongue base are within normal limits. The oropharynx is clear. Vallecula and epiglottis are within normal limits. Aryepiglottic folds and piriform sinuses are clear. Vocal cords are midline and symmetric. Trachea is clear. Salivary glands: The submandibular glands are not discretely visualized. Parotid glands are within normal limits bilaterally. Thyroid: Atrophic Lymph nodes: No discrete adenopathy is present. 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 Minimal atherosclerotic changes are present at the carotid bifurcations without significant stenosis. Limited intracranial: Within normal limits. Visualized orbits: The globes and orbits are within normal limits. Mastoids and visualized paranasal sinuses: The paranasal sinuses and mastoid air cells are  clear. Skeleton: Vertebral body heights and alignment are normal. No focal osseous lesions are present. Large dental caries and periapical lucencies are noted in the residual left maxillary molar. Upper chest: Scarring in the left upper lobe and postoperative changes are stable. Other: A progressive diffuse soft tissue mass present in the posterior right lateral neck. This extends to the right C2-3 and C3-4 neural foramina. Irregular soft tissue surrounds the spinal cord at both levels. A more discrete mass is present a posterior right spinal canal or possibly within the spinal cord at C2-3. This demonstrates scratched at this is similar to the  December study but has progressed since the July study. IMPRESSION: 1. Progressive diffuse soft tissue mass in the posterior right lateral neck. This extends to the right C2-3 and C3-4 neural foramina. Irregular soft tissue surrounds the spinal cord at both levels. 2. A more discrete mass is present a posterior right spinal canal or possibly within the spinal cord at C2-3. This is similar to the December study but has progressed since the July study. MRI of the cervical spine without and with contrast would be useful for further evaluation. 3. No discrete adenopathy. 4. Large dental caries and periapical lucencies in the residual left maxillary molar. 5. Stable scarring in the left upper lobe and postoperative changes. These results were called by telephone at the time of interpretation on 01/07/2024 at 4:39 pm to provider [facility] , who verbally acknowledged these results. Electronically Signed   By: Marin Roberts M.D.   On: 01/07/2024 16:50    Assessment: 38 y.o. 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  He is on IV fentanyl every 1 hours as needed.  Palliative consult has been urgently requested, Dr. Linna Darner will see him today -Chart reviewed, and confirmed with patient, his initial head neck radiation was done in [place], he has not had radiation to the neck recently.  I will reach out to radiation oncologist Dr. Mitzi Hansen, he will review to see if he is a candidate for palliative radiation. -Patient received 1 dose dexamethasone 10 mg in the ED yesterday, will put him on 8 mg twice daily  -I will follow-up as needed over the weekend.  Dr. Bertis Ruddy will follow-up on Monday.   Malachy Mood, MD 01/08/2024  1:35 PM

## 2024-01-08 NOTE — Progress Notes (Signed)
 SLP Cancellation Note  Patient Details Name: Christian Simmons MRN: [id number] DOB: [birth year]   Cancelled treatment:       Reason Eval/Treat Not Completed: Patient declined, no reason specified. Patient in excruciating pain even when briefly sitting up in bed. SLP did not attempt bedside swallow evaluation as it would have caused him too much pain to participate. Patient did indicate that he has been able to tolerate milk but that was all he has had lately. SLP will continue to follow.  Angela Nevin, MA, CCC-SLP Speech Therapy

## 2024-01-08 NOTE — Consult Note (Signed)
 Consultation Note Date: 01/08/2024   Patient Name: Christian Simmons  DOB: 11/28/1985  MRN: [id number]  Age / Sex: 38 y.o., male  PCP: Patient, No Pcp Per Referring Physician: Glade Lloyd, MD  Reason for Consultation: Establishing goals of care, Pain control, and Psychosocial/spiritual support  HPI/Patient Profile: 38 y.o. male  with past medical history of    admitted on 01/07/2024 with  .   Clinical Assessment and Goals of Care: 38 year old with recurrent head and neck cancer for almost 20 years, history of metastatic disease to lungs treated with chemotherapy, history of cancer associated pain generalized anxiety and chronic anemia admitted to hospital medicine service with worsening neck pain.  Of note, patient recently saw medical oncology as well as palliative care at [facility]. CT scan showing progressive diffuse soft tissue mass in the posterior right lateral neck surrounding the spinal cord, MRI cervical spine also recommended and obtained showing findings concerning for metastatic disease or direct invasion of the foraminal/epidural process. Palliative consulted for pain management. Patient also seen by medical oncology, radiation oncology to be also consulted, patient started on steroids. Patient resting in bed after just having received IV Dilaudid.  Appears comfortable for now.  Call placed unable to reach spouse.  Discussed with the bedside nursing colleague, discussed with medical oncology in detail.  Note recommendations below.  Palliative to follow along closely.   NEXT OF KIN Spouse and 2 cousins are listed as next of kin.  Attempted to call spouse at [phone] with no response  SUMMARY OF RECOMMENDATIONS   Discontinue OxyContin and place patient back on PO Methadone 10 mg PO Q 8 hours.  Dilaudid PCA bolus  PO PRN opioids  Agree with current anti emetic regimen as well.   Appreciate oncology input, rad onc to also evaluate, agree with steroids.  Patient sees my colleague Ms Cousar NP at the cancer center, recommend continued outpatient palliative follow up.  Full code full scope for now, monitor hospital course.  Spiritual care consult is recommended, if the patient consents.  Thank you for the consult, PMT to follow along.   Code Status/Advance Care Planning: Full code   Symptom Management:     Palliative Prophylaxis:  Frequent Pain Assessment  Additional Recommendations (Limitations, Scope, Preferences): Full Scope Treatment  Psycho-social/Spiritual:  Desire for further Chaplaincy support:yes Additional Recommendations: Caregiving  Support/Resources  Prognosis:  Unable to determine  Discharge Planning: To Be Determined      Primary Diagnoses: Present on Admission:  Neck pain  Malignant neoplasm of head and neck (HCC)   I have reviewed the medical record, interviewed the patient and family, and examined the patient. The following aspects are pertinent.  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   Social History   Socioeconomic History   Marital status: Married    Spouse name: Not on file   Number of children: 1   Years of education: Not on file   Highest education level: Not on file  Occupational History   Not on file  Tobacco Use   Smoking status: Never   Smokeless tobacco: Never  Vaping Use   Vaping status: Never Used  Substance and Sexual Activity   Alcohol use: No   Drug use: No   Sexual activity: Not  on file  Other Topics Concern   Not on file  Social History Narrative   ** Merged History Encounter **       Social Drivers of Health   Financial Resource Strain: Not on file  Food Insecurity: No Food Insecurity (01/07/2024)   Hunger Vital Sign    Worried About Running Out of Food in the Last Year: Never true    Ran Out of Food in the Last Year: Never true  Transportation Needs: No Transportation Needs (01/07/2024)   PRAPARE - Administrator, Civil Service (Medical): No    Lack of Transportation (Non-Medical): No  Physical Activity: Not on file  Stress: Not on file  Social Connections: Not on file   Family History  Problem Relation Age of Onset   Hypertension Mother    Diabetes Mother    Hypertension Father    Diabetes Father    Scheduled Meds:  acetaminophen  650 mg Oral Q4H   baclofen  5 mg Oral TID   celecoxib  200 mg Oral BID   feeding supplement  237 mL Oral BID BM   methadone  10 mg Oral Q8H   polyethylene glycol  17 g Oral Daily   senna-docusate  2 tablet Oral BID   sodium chloride flush  3 mL Intravenous Q12H   Continuous Infusions:  potassium chloride 10 mEq (01/08/24 1052)   PRN Meds:.fentaNYL (SUBLIMAZE) injection, HYDROmorphone (DILAUDID) injection, LORazepam, ondansetron (ZOFRAN) IV, oxyCODONE, oxyCODONE Medications Prior to Admission:  Prior to Admission medications   Medication Sig Start Date End Date Taking? Authorizing Provider  bisacodyl (DULCOLAX) 10 MG suppository Place 1 suppository (10 mg total) rectally as needed for moderate constipation. 06/18/23   Artis Delay, MD  blood glucose meter kit and supplies KIT Dispense based on patient and insurance preference. Use up to four times daily as directed. (FOR ICD-9 250.00, 250.01). 11/11/20   Artis Delay, MD  cetirizine (ZYRTEC) 10 MG tablet Take 1 tablet (10 mg total) by mouth daily as needed for allergies. 07/21/21   Artis Delay, MD  dexamethasone (DECADRON) 4 MG tablet Take 2 tablets by mouth  the night before and 2 tablets the morning of chemotherapy, every 3 weeks x 6 cycles 09/21/23   Artis Delay, MD  famotidine (PEPCID) 40 MG tablet Take 1 tablet (40 mg total) by mouth at bedtime. 03/11/23   Artis Delay, MD  gabapentin (NEURONTIN) 100 MG capsule Take 2 capsules (200 mg total) by mouth 2 (two) times daily. 10/22/23   Artis Delay, MD  levothyroxine (SYNTHROID) 175 MCG tablet Take 1 tablet (175 mcg total) by mouth daily before breakfast. 02/05/23   Artis Delay, MD  LORazepam (ATIVAN) 0.5 MG tablet Take 1 tablet (0.5 mg total) by mouth 2 (two) times  daily as needed for anxiety. 12/27/23   Artis Delay, MD  metFORMIN (GLUCOPHAGE) 500 MG tablet Take 1 tablet (500 mg total) by mouth 2 (two) times daily with a meal. 01/29/23   Artis Delay, MD  methadone (DOLOPHINE) 10 MG tablet Take 1 tablet (10 mg) by mouth every 8 hours. 01/04/24   Artis Delay, MD  ondansetron (ZOFRAN) 8 MG tablet Take 1 tablet (8 mg total) by mouth every 8 (eight) hours as needed for nausea or vomiting. 08/20/23   Artis Delay, MD  Oxycodone HCl 10 MG TABS Take 1 tablet (10 mg total) by mouth every 4 (four) hours as needed. 12/30/23   Artis Delay, MD  pantoprazole (PROTONIX) 40 MG tablet Take 1 tablet (40 mg total) by mouth daily. 10/26/23   Artis Delay, MD  polyethylene glycol (MIRALAX / GLYCOLAX) 17 g packet Take 17 g by mouth 2 (two) times daily. 09/10/23   Artis Delay, MD  prochlorperazine (COMPAZINE) 10 MG tablet Take 1 tablet (10 mg total) by mouth every 6 (six) hours as needed for nausea or vomiting. 05/07/23   Artis Delay, MD  senna-docusate (SENOKOT-S) 8.6-50 MG tablet Take 2 tablets by mouth 2 (two) times daily. 11/20/22   Artis Delay, MD  tiZANidine (ZANAFLEX) 2 MG tablet Take 1 tablet (2 mg total) by mouth every 6 (six) hours as needed for muscle spasms. 11/18/23   Artis Delay, MD  venlafaxine XR (EFFEXOR-XR) 37.5 MG 24 hr capsule Take 1 capsule (37.5 mg total) by mouth daily with breakfast. 01/04/24   Artis Delay, MD   Allergies   Allergen Reactions   Phenergan [Promethazine Hcl] Anxiety    Causes patients HR to go into the 140's with severe anxiety   Heparin Other (See Comments)    No Pork derivatives due to religion   Pork-Derived Products Other (See Comments)    Religious reasons   Clindamycin Rash   Review of Systems Uncontrolled pain Physical Exam Chronically ill and deconditioned appearing Resting in bed Thinly built, cancer related cachexia evident At present breathing regularly Abdomen does not appear distended  Vital Signs: BP 123/81 (BP Location: Left Arm)   Pulse 70   Temp 98.8 F (37.1 C) (Oral)   Resp 18   Ht 5\' 6"  (1.676 m)   Wt 75.4 kg   SpO2 100%   BMI 26.83 kg/m  Pain Scale: 0-10   Pain Score: 8    SpO2: SpO2: 100 % O2 Device:SpO2: 100 % O2 Flow Rate: .   IO: Intake/output summary:  Intake/Output Summary (Last 24 hours) at 01/08/2024 1145 Last data filed at 01/08/2024 1008 Gross per 24 hour  Intake 3 ml  Output --  Net 3 ml    LBM: Last BM Date : 01/08/24 Baseline Weight: Weight: 77.2 kg Most recent weight: Weight: 75.4 kg     Palliative Assessment/Data:   Palliative performance scale 40%  Time In: 12 Time Out: 1300 Time Total: 60 Greater than 50%  of this time was spent counseling and coordinating care related to the above assessment and plan.  Signed by: Rosalin Hawking, MD   Please contact Palliative Medicine Team phone at [phone] for questions and concerns.  For individual provider: See Loretha Stapler

## 2024-01-08 NOTE — Plan of Care (Signed)

## 2024-01-09 LAB — BASIC METABOLIC PANEL WITH GFR
Anion gap: 9 (ref 5–15)
BUN: 17 mg/dL (ref 6–20)
CO2: 24 mmol/L (ref 22–32)
Calcium: 9.2 mg/dL (ref 8.9–10.3)
Chloride: 97 mmol/L — ABNORMAL LOW (ref 98–111)
Creatinine, Ser: 0.78 mg/dL (ref 0.61–1.24)
GFR, Estimated: >60 mL/min (ref >60)
Glucose, Bld: 139 mg/dL — ABNORMAL HIGH (ref 70–99)
Potassium: 3.5 mmol/L (ref 3.5–5.1)
Sodium: 130 mmol/L — ABNORMAL LOW (ref 135–145)

## 2024-01-09 LAB — MAGNESIUM: Magnesium: 1.6 mg/dL — ABNORMAL LOW (ref 1.7–2.4)

## 2024-01-09 MED ORDER — DEXAMETHASONE SODIUM PHOSPHATE 4 MG/ML IJ SOLN: 6.0000 mg | Freq: Two times a day (BID) | INTRAMUSCULAR | Status: DC

## 2024-01-09 MED ORDER — SODIUM CHLORIDE 0.9% FLUSH
10.0000 mL | Freq: Two times a day (BID) | INTRAVENOUS | Status: DC
  Administered 2024-01-09 – 2024-01-11 (×2): 10 mL

## 2024-01-09 MED ORDER — LORAZEPAM 2 MG/ML IJ SOLN
0.5000 mg | INTRAMUSCULAR | Status: DC | PRN
  Administered 2024-01-09 – 2024-01-11 (×10): 0.5 mg via INTRAVENOUS
  Filled 2024-01-09 (×10): qty 1

## 2024-01-09 MED ORDER — SODIUM CHLORIDE 0.9% FLUSH: 10.0000 mL | INTRAVENOUS | Status: DC | PRN

## 2024-01-09 MED ORDER — DEXAMETHASONE SODIUM PHOSPHATE 4 MG/ML IJ SOLN
6.0000 mg | Freq: Two times a day (BID) | INTRAMUSCULAR | Status: DC
Start: 2024-01-09 — End: 2024-01-11
  Administered 2024-01-09 – 2024-01-11 (×4): 6 mg via INTRAVENOUS
  Filled 2024-01-09 (×5): qty 2

## 2024-01-09 MED ORDER — CHLORHEXIDINE GLUCONATE CLOTH 2 % EX PADS: 6.0000 | MEDICATED_PAD | Freq: Every day | CUTANEOUS | Status: DC

## 2024-01-09 NOTE — Progress Notes (Signed)
 Pt tearful pacing at bedside with sister at his side.  Pt refuses steroid stating it's making I'm "crazy".  Pt educated on benefits and side effects of steroids.  Pt insists that I page oncologist to "change the steroid".  To message oncology at this time

## 2024-01-09 NOTE — Progress Notes (Signed)
 Daily Progress Note   Patient Name: Christian Simmons       Date: 01/09/2024 DOB: April 08, 1986  Age: 38 y.o. MRN#: [id number] Attending Physician: Glade Lloyd, MD Primary Care Physician: Patient, No Pcp Per Admit Date: 01/07/2024  Reason for Consultation/Follow-up: Establishing goals of care, Non pain symptom management, and Pain control  Subjective: Resting in bed, pain somewhat better controlled on PCA, complains of feeling anxious, restless and feeling "crazy" while on steroids - asks for Ativan to be made available more frequently, asks for steroid dose to be lowered. Still feels fullness in neck, sister at bedside offering support.    Length of Stay: 2  Current Medications: Scheduled Meds:   acetaminophen  650 mg Oral Q4H   baclofen  5 mg Oral TID   celecoxib  200 mg Oral BID   Chlorhexidine Gluconate Cloth  6 each Topical Daily   dexamethasone  6 mg Intravenous Q12H   feeding supplement  237 mL Oral BID BM   HYDROmorphone   Intravenous Q4H   methadone  10 mg Oral Q8H   nicotine  14 mg Transdermal Daily   polyethylene glycol  17 g Oral Daily   senna-docusate  2 tablet Oral BID   sodium chloride flush  10-40 mL Intracatheter Q12H   sodium chloride flush  3 mL Intravenous Q12H    Continuous Infusions:   PRN Meds: bisacodyl, diphenhydrAMINE **OR** diphenhydrAMINE, HYDROmorphone (DILAUDID) injection, LORazepam, naloxone **AND** sodium chloride flush, ondansetron (ZOFRAN) IV, oxyCODONE, sodium chloride flush  Physical Exam         Weak appearing Appears deconditioned, chronically ill Trace LE edema Restless and anxious appearing  Vital Signs: BP (!) 113/57 (BP Location: Right Arm)   Pulse 71   Temp 98.3 F (36.8 C) (Oral)   Resp 16   Ht 5\' 6"  (1.676 m)   Wt 75.4 kg    SpO2 98%   BMI 26.83 kg/m  SpO2: SpO2: 98 % O2 Device: O2 Device: Room Air O2 Flow Rate: O2 Flow Rate (L/min): 0 L/min  Intake/output summary:  Intake/Output Summary (Last 24 hours) at 01/09/2024 1055 Last data filed at 01/09/2024 0645 Gross per 24 hour  Intake 354.61 ml  Output --  Net 354.61 ml   LBM: Last BM Date : 01/08/24 Baseline Weight: Weight: 77.2 kg Most recent weight: Weight:  75.4 kg       Palliative Assessment/Data:      Patient Active Problem List   Diagnosis Date Noted   Neck pain 01/07/2024   Drug-induced skin rash 05/21/2023   Anemia due to antineoplastic chemotherapy 03/19/2023   Hypomagnesemia 01/22/2023   Diarrhea 01/22/2023   Mucositis due to antineoplastic therapy 01/08/2023   Anterior dislocation of right shoulder 12/01/2021   Chronic limitation of movement of neck 10/26/2021   Anxiety, generalized 06/18/2020   Preventive measure 06/13/2020   Liver lesion 10/05/2019   Type 2 diabetes mellitus (HCC) 04/06/2019   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, 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   Vitamin B12 deficiency 03/02/2019   Pancytopenia, acquired (HCC) 11/11/2018   Goals of care, counseling/discussion 09/21/2018   Cancer associated pain 09/08/2018   Financial difficulties 11/26/2017   Medically noncompliant 04/13/2017   Port catheter in place 04/01/2016   Gastritis without bleeding 01/22/2016   Arrhythmia 12/25/2015   Infection of eyelash follicle of left eye 12/25/2015   Poor dentition 10/16/2015   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/08/2015   Other constipation 05/16/2015   Neck pain on right side 01/28/2015   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/2014   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   Acquired hypothyroidism 06/09/2014   Malignant neoplasm of head and neck (HCC) 06/01/2014   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  This is concerning for metastatic disease and direct invasion of the epidural process.  Sees PMT at cancer center On PO Methadone Now placed on IV steroids, Ativan IV PRN and Dilaudid bolus only PCA.   Recommendations/Plan: Continue bolus only Dilaudid PCA, continue oral methadone. Decrease the dose of steroids, make her Ativan available more frequently. Recommend spiritual care - Muslim chaplain if available. Continue current mode of care Full code full scope   Goals of Care and Additional Recommendations: Limitations on Scope of Treatment: Full Scope Treatment  Code Status:    Code Status Orders  (From admission, onward)           Start     Ordered   01/07/24 1903  Full code  Continuous       Question:  By:  Answer:  Consent: discussion documented in EHR   01/07/24 1902           Code Status History     This patient has a current code status but no historical code status.       Prognosis:  Unable to determine  Discharge Planning: To Be Determined  Care plan was discussed with  patient, sister. Updated wife on phone in the evening on 01-08-24.   Thank you for allowing the Palliative Medicine Team to assist in the care of this patient. Mod MDM.      Greater than 50%  of this time was spent counseling and coordinating care related to the above assessment and plan.  Rosalin Hawking, MD  Please contact Palliative Medicine Team phone at [phone] for questions and concerns.

## 2024-01-09 NOTE — Progress Notes (Signed)
 Paged chaplain in attempt to get a muslim chaplain at bedside for patient and his sister

## 2024-01-09 NOTE — Progress Notes (Addendum)
 Christian Simmons   DOB:1986/02/03   ZO#:[id number]   JWJ#:[id number]  Medical oncology follow-up note  Subjective: Patient is on PCA now, his pain is better controlled.  He feels the steroid helped with the pain, but it made his anxiety and irritability much worse, he declined steroid this morning.  Per his request, I spoke with his sister outside his room regarding his prognosis and treatment plan.   Objective:  Vitals:   01/09/24 0501 01/09/24 0831  BP: (!) 113/57   Pulse: 71   Resp: 14 16  Temp: 98.3 F (36.8 C)   SpO2: 98%     Body mass index is 26.83 kg/m.  Intake/Output Summary (Last 24 hours) at 01/09/2024 1208 Last data filed at 01/09/2024 0645 Gross per 24 hour  Intake 354.61 ml  Output --  Net 354.61 ml     Sclerae unicteric  Oropharynx clear  Neck some fullness, no discrete mass  Lungs clear -- no rales or rhonchi  Heart regular rate and rhythm  Abdomen benign  MSK no focal spinal tenderness, no peripheral edema  Neuro nonfocal    CBG (last 3)  No results for input(s): "GLUCAP" in the last 72 hours.   Labs:  Lab Results  Component Value Date   WBC 5.1 01/08/2024   HGB 11.1 (L) 01/08/2024   HCT 34.0 (L) 01/08/2024   MCV 86.7 01/08/2024   PLT 167 01/08/2024   NEUTROABS 6.0 01/07/2024     Urine Studies No results for input(s): "UHGB", "CRYS" in the last 72 hours.  Invalid input(s): "UACOL", "UAPR", "USPG", "UPH", "UTP", "UGL", "UKET", "UBIL", "UNIT", "UROB", "ULEU", "UEPI", "UWBC", "URBC", "UBAC", "CAST", "UCOM", "BILUA"  Basic Metabolic Panel: Recent Labs  Lab 01/04/24 0940 01/06/24 1550 01/07/24 1319 01/08/24 0532 01/09/24 0500  NA 136 131* 134* 134* 130*  K 3.9 3.5 3.3* 3.4* 3.5  CL 102 97* 99 98 97*  CO2 28 24 26 26 24   GLUCOSE 153* 105* 102* 97 139*  BUN 15 20 22* 19 17  CREATININE 0.84 0.68 0.73 0.78 0.78  CALCIUM 9.7 9.0 9.3 8.9 9.2  MG  --   --   --   --  1.6*   GFR Estimated Creatinine Clearance: 113 mL/min (by C-G formula based  on SCr of 0.78 mg/dL). Liver Function Tests: Recent Labs  Lab 01/04/24 0940 01/06/24 1550 01/07/24 1319  AST 26 27 28   ALT 15 17 18   ALKPHOS 57 47 44  BILITOT 0.4 0.6 0.6  PROT 7.6 7.0 6.9  ALBUMIN 4.7 4.0 4.0   No results for input(s): "LIPASE", "AMYLASE" in the last 168 hours. No results for input(s): "AMMONIA" in the last 168 hours. Coagulation profile Recent Labs  Lab 01/08/24 0532  INR 1.3*    CBC: Recent Labs  Lab 01/04/24 0940 01/06/24 1550 01/07/24 1319 01/08/24 0532  WBC 5.1 5.8 7.5 5.1  NEUTROABS 4.2 4.8 6.0  --   HGB 11.6* 11.1* 11.1* 11.1*  HCT 35.3* 33.0* 32.9* 34.0*  MCV 86.9 85.3 85.7 86.7  PLT 268 207 192 167   Cardiac Enzymes: No results for input(s): "CKTOTAL", "CKMB", "CKMBINDEX", "TROPONINI" in the last 168 hours. BNP: Invalid input(s): "POCBNP" CBG: No results for input(s): "GLUCAP" in the last 168 hours. D-Dimer No results for input(s): "DDIMER" in the last 72 hours. Hgb A1c No results for input(s): "HGBA1C" in the last 72 hours. Lipid Profile No results for input(s): "CHOL", "HDL", "LDLCALC", "TRIG", "CHOLHDL", "LDLDIRECT" in the last 72 hours. Thyroid function studies No results  for input(s): "TSH", "T4TOTAL", "T3FREE", "THYROIDAB" in the last 72 hours.  Invalid input(s): "FREET3" Anemia work up No results for input(s): "VITAMINB12", "FOLATE", "FERRITIN", "TIBC", "IRON", "RETICCTPCT" in the last 72 hours. Microbiology Recent Results (from the past 240 hours)  Group A Strep by PCR     Status: None   Collection Time: 01/06/24  3:51 PM   Specimen: Throat; Sterile Swab  Result Value Ref Range Status   Group A Strep by PCR NOT DETECTED NOT DETECTED Final    Comment: Performed at [facility], [address] [place], [place] [address]  Resp panel by RT-PCR (RSV, Flu A&B, Covid) Anterior Nasal Swab     Status: None   Collection Time: 01/06/24  3:51 PM   Specimen: Anterior Nasal Swab  Result Value Ref Range Status   SARS  Coronavirus 2 by RT PCR NEGATIVE NEGATIVE Final    Comment: (NOTE) SARS-CoV-2 target nucleic acids are NOT DETECTED.  The SARS-CoV-2 RNA is generally detectable in upper respiratory specimens during the acute phase of infection. The lowest concentration of SARS-CoV-2 viral copies this assay can detect is 138 copies/mL. A negative result does not preclude SARS-Cov-2 infection and should not be used as the sole basis for treatment or other patient management decisions. A negative result may occur with  improper specimen collection/handling, submission of specimen other than nasopharyngeal swab, presence of viral mutation(s) within the areas targeted by this assay, and inadequate number of viral copies(<138 copies/mL). A negative result must be combined with clinical observations, patient history, and epidemiological information. The expected result is Negative.  Fact Sheet for Patients:  BloggerCourse.com  Fact Sheet for Healthcare Providers:  SeriousBroker.it  This test is no t yet approved or cleared by the [place] FDA and  has been authorized for detection and/or diagnosis of SARS-CoV-2 by FDA under an Emergency Use Authorization (EUA). This EUA will remain  in effect (meaning this test can be used) for the duration of the COVID-19 declaration under Section 564(b)(1) of the Act, 21 U.S.C.section 360bbb-3(b)(1), unless the authorization is terminated  or revoked sooner.       Influenza A by PCR NEGATIVE NEGATIVE Final   Influenza B by PCR NEGATIVE NEGATIVE Final    Comment: (NOTE) The Xpert Xpress SARS-CoV-2/FLU/RSV plus assay is intended as an aid in the diagnosis of influenza from Nasopharyngeal swab specimens and should not be used as a sole basis for treatment. Nasal washings and aspirates are unacceptable for Xpert Xpress SARS-CoV-2/FLU/RSV testing.  Fact Sheet for  Patients: BloggerCourse.com  Fact Sheet for Healthcare Providers: SeriousBroker.it  This test is not yet approved or cleared by the [place] FDA and has been authorized for detection and/or diagnosis of SARS-CoV-2 by FDA under an Emergency Use Authorization (EUA). This EUA will remain in effect (meaning this test can be used) for the duration of the COVID-19 declaration under Section 564(b)(1) of the Act, 21 U.S.C. section 360bbb-3(b)(1), unless the authorization is terminated or revoked.     Resp Syncytial Virus by PCR NEGATIVE NEGATIVE Final    Comment: (NOTE) Fact Sheet for Patients: BloggerCourse.com  Fact Sheet for Healthcare Providers: SeriousBroker.it  This test is not yet approved or cleared by the [place] FDA and has been authorized for detection and/or diagnosis of SARS-CoV-2 by FDA under an Emergency Use Authorization (EUA). This EUA will remain in effect (meaning this test can be used) for the duration of the COVID-19 declaration under Section 564(b)(1) of the Act, 21 U.S.C.  section 360bbb-3(b)(1), unless the authorization is terminated or revoked.  Performed at [facility], [address], [place], [place] [address]       Studies:  MR Cervical Spine W and Wo Contrast Result Date: 01/07/2024 CLINICAL DATA:  Acute neck pain.  Known malignancy. EXAM: MRI CERVICAL SPINE WITHOUT AND WITH CONTRAST TECHNIQUE: Multiplanar and multiecho pulse sequences of the cervical spine, to include the craniocervical junction and cervicothoracic junction, were obtained without and with intravenous contrast. CONTRAST:  7mL GADAVIST GADOBUTROL 1 MMOL/ML IV SOLN COMPARISON:  CT neck 01/07/2024 FINDINGS: Alignment: Physiologic. Vertebrae: No fracture, evidence of discitis, or bone lesion. Cord: There is abnormal hyperintense T2-weighted signal within the cervical spinal  cord extending from C2-C5. There is intramedullary abnormal contrast enhancement within the spinal cord at the C2-3 level, measuring 0.8 x 0.7 x 1.5 cm. There is ventral and dorsal epidural contrast enhancement at the C1-4 levels. Posterior Fossa, vertebral arteries, paraspinal tissues: Mass within the right cervical soft tissues at the C3-4 level is better appreciated on the earlier CT. This mass likely extends through the right C3 neural foramen. Disc levels: No disc herniation, spinal canal stenosis or neural foraminal stenosis. The thecal sac is effaced at the levels of the above described epidural contrast enhancement. IMPRESSION: 1. Intramedullary abnormal contrast enhancement within the cervical spinal cord at the C2-3 level, measuring 0.8 x 0.7 x 1.5 cm, with abnormal hyperintense T2-weighted signal within the cervical spinal cord extending from C2-C5. Given history of head and neck carcinoma, this is concerning for metastatic disease or direct invasion of the foraminal/epidural process described below. 2. Ventral and dorsal epidural contrast enhancement at the C1-4 levels, concerning for epidural spread of disease. The soft tissue lis in of the right neck likely extends through the right C3 neural foramen to affect the epidural space. 3. Mass within the right cervical soft tissues at the C3-4 level is better appreciated on the earlier CT. Electronically Signed   By: Deatra Robinson M.D.   On: 01/07/2024 21:28   CT Soft Tissue Neck W Contrast Result Date: 01/07/2024 CLINICAL DATA:  Non pulsatile neck mass. Personal history of head and neck cancer. Increasing pain and swelling. EXAM: CT NECK WITH CONTRAST TECHNIQUE: Multidetector CT imaging of the neck was performed using the standard protocol following the bolus administration of intravenous contrast. RADIATION DOSE REDUCTION: This exam was performed according to the departmental dose-optimization program which includes automated exposure control, adjustment  of the mA and/or kV according to patient size and/or use of iterative reconstruction technique. CONTRAST:  75mL OMNIPAQUE IOHEXOL 300 MG/ML  SOLN COMPARISON:  CT neck with contrast 10/04/2023 and 04/13/2023. FINDINGS: Pharynx and larynx: Nasopharynx is clear. Soft palate and tongue base are within normal limits. The oropharynx is clear. Vallecula and epiglottis are within normal limits. Aryepiglottic folds and piriform sinuses are clear. Vocal cords are midline and symmetric. Trachea is clear. Salivary glands: The submandibular glands are not discretely visualized. Parotid glands are within normal limits bilaterally. Thyroid: Atrophic Lymph nodes: No discrete adenopathy is present. 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 Minimal atherosclerotic changes are present at the carotid bifurcations without significant stenosis. Limited intracranial: Within normal limits. Visualized orbits: The globes and orbits are within normal limits. Mastoids and visualized paranasal sinuses: The paranasal sinuses and mastoid air cells are clear. Skeleton: Vertebral body heights and alignment are normal. No focal osseous lesions are present. Large dental caries and periapical lucencies are noted in the residual left maxillary molar.  Upper chest: Scarring in the left upper lobe and postoperative changes are stable. Other: A progressive diffuse soft tissue mass present in the posterior right lateral neck. This extends to the right C2-3 and C3-4 neural foramina. Irregular soft tissue surrounds the spinal cord at both levels. A more discrete mass is present a posterior right spinal canal or possibly within the spinal cord at C2-3. This demonstrates scratched at this is similar to the December study but has progressed since the July study. IMPRESSION: 1. Progressive diffuse soft tissue mass in the posterior right lateral neck. This extends to the right C2-3 and C3-4 neural foramina. Irregular soft tissue surrounds the spinal cord at both  levels. 2. A more discrete mass is present a posterior right spinal canal or possibly within the spinal cord at C2-3. This is similar to the December study but has progressed since the July study. MRI of the cervical spine without and with contrast would be useful for further evaluation. 3. No discrete adenopathy. 4. Large dental caries and periapical lucencies in the residual left maxillary molar. 5. Stable scarring in the left upper lobe and postoperative changes. These results were called by telephone at the time of interpretation on 01/07/2024 at 4:39 pm to provider [facility] , who verbally acknowledged these results. Electronically Signed   By: Marin Roberts M.D.   On: 01/07/2024 16:50    Assessment: 38 y.o. 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 -Dr. Mitzi Hansen has reviewed his case, and the plan to discussed in the CNS tumor board tomorrow morning.  They would like to try off for a course of palliative radiation to his cervical spine  -Due to the side effect from dexamethasone, dose has been decreased to 612 hours with Ativan as needed for his anxiety and irritability.  If he still has issues, okay to further decrease to 4 mg 1-2 times a day. -Dr. Bertis Ruddy will follow-up on Monday.   Malachy Mood, MD 01/09/2024  12:08 PM

## 2024-01-09 NOTE — Plan of Care (Signed)

## 2024-01-09 NOTE — Progress Notes (Signed)
 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  On presentation, CT of neck showed progressive diffuse soft tissue mass in the posterior right lateral neck surrounding the spinal cord with recommendations of MRI of cervical spine.  Patient received IV analgesics in the ED but continued to have pain.  MRI of cervical spine spine showed findings concerning for metastatic disease or direct invasion of the foraminal/epidural process.  Oncology/palliative care and radiation oncology has been consulted.  Assessment & Plan:   Severe neck pain Recurrent head and neck cancer with history of metastatic disease to the lungs treated with chemotherapy Cancer associated pain -Presented with worsening neck pain. -CT of neck showed progressive diffuse soft tissue mass in the posterior right lateral neck surrounding the spinal cord with recommendations of MRI of cervical spine.  MRI of cervical spine showed findings concerning for metastatic disease or direct invasion of the foraminal/epidural process - Continue current pain management regimen as per palliative care.  Continue Decadron.  Continue bowel regimen.  Oncology following.  Awaiting radiation oncology recommendations regarding need for radiation.  Anemia of chronic disease - From cancer.  Hemoglobin currently stable.  No signs of bleeding.  Monitor  Hyponatremia - Monitor.  Encourage oral intake  Hypokalemia - Improved  Hypomagnesemia -Replace.  Repeat a.m. labs  Anxiety - Patient was on Ativan 0.5 mg twice a day at home.  Complained of worsening anxiety: Currently on Ativan 0.5 mg every 6 hours as needed IV.  DVT  prophylaxis: SCDs Code Status: Full Family Communication: Sister at bedside Disposition Plan: Status is: Inpatient Remains inpatient appropriate because: Of severity of illness    Consultants: Oncology/palliative care/radiation oncology  Procedures: None  Antimicrobials: None   Subjective: Patient seen and examined at bedside.  Continues to have intermittent anxiety and severe neck pain but feels slightly better.  No fever, vomiting, shortness of breath reported. Objective: Vitals:   01/08/24 2144 01/09/24 0119 01/09/24 0445 01/09/24 0501  BP: 124/84   (!) 113/57  Pulse: 82   71  Resp: 14 14 14 14   Temp: 98.3 F (36.8 C)   98.3 F (36.8 C)  TempSrc: Oral   Oral  SpO2: 97% 99% 99% 98%  Weight:    75.4 kg  Height:        Intake/Output Summary (Last 24 hours) at 01/09/2024 0801 Last data filed at 01/08/2024 1500 Gross per 24 hour  Intake 233.11 ml  Output --  Net 233.11 ml   Filed Weights   01/07/24 1203 01/07/24 2025 01/09/24 0501  Weight: 77.2 kg 75.4 kg 75.4 kg    Examination:  General: On room air.  No distress.  Chronically ill and deconditioned looking. ENT/neck: No thyromegaly.  JVD is not elevated  respiratory: Decreased breath sounds at bases bilaterally with some crackles; no wheezing  CVS: S1-S2 heard, rate controlled currently Abdominal: Soft, nontender, slightly distended; no organomegaly, bowel sounds are heard Extremities: Trace lower extremity edema; no cyanosis  CNS: Sleepy, wakes up slightly, slow to respond.  No focal neurologic deficit.  Moves extremities Lymph: No obvious lymphadenopathy Skin: No obvious ecchymosis/lesions  psych: Mostly flat affect.  Currently not agitated.  Musculoskeletal: No obvious joint swelling/deformity  Data Reviewed: I have personally reviewed following labs and imaging studies  CBC: Recent Labs  Lab 01/04/24 0940 01/06/24 1550 01/07/24 1319 01/08/24 0532  WBC 5.1 5.8 7.5 5.1  NEUTROABS 4.2 4.8 6.0  --    HGB 11.6* 11.1* 11.1* 11.1*  HCT 35.3* 33.0* 32.9* 34.0*  MCV 86.9 85.3 85.7 86.7  PLT 268 207 192 167   Basic Metabolic Panel: Recent Labs  Lab 01/04/24 0940 01/06/24 1550 01/07/24 1319 01/08/24 0532 01/09/24 0500  NA 136 131* 134* 134* 130*  K 3.9 3.5 3.3* 3.4* 3.5  CL 102 97* 99 98 97*  CO2 28 24 26 26 24   GLUCOSE 153* 105* 102* 97 139*  BUN 15 20 22* 19 17  CREATININE 0.84 0.68 0.73 0.78 0.78  CALCIUM 9.7 9.0 9.3 8.9 9.2  MG  --   --   --   --  1.6*   GFR: Estimated Creatinine Clearance: 113 mL/min (by C-G formula based on SCr of 0.78 mg/dL). Liver Function Tests: Recent Labs  Lab 01/04/24 0940 01/06/24 1550 01/07/24 1319  AST 26 27 28   ALT 15 17 18   ALKPHOS 57 47 44  BILITOT 0.4 0.6 0.6  PROT 7.6 7.0 6.9  ALBUMIN 4.7 4.0 4.0   No results for input(s): "LIPASE", "AMYLASE" in the last 168 hours. No results for input(s): "AMMONIA" in the last 168 hours. Coagulation Profile: Recent Labs  Lab 01/08/24 0532  INR 1.3*   Cardiac Enzymes: No results for input(s): "CKTOTAL", "CKMB", "CKMBINDEX", "TROPONINI" in the last 168 hours. BNP (last 3 results) No results for input(s): "PROBNP" in the last 8760 hours. HbA1C: No results for input(s): "HGBA1C" in the last 72 hours. CBG: No results for input(s): "GLUCAP" in the last 168 hours. Lipid Profile: No results for input(s): "CHOL", "HDL", "LDLCALC", "TRIG", "CHOLHDL", "LDLDIRECT" in the last 72 hours. Thyroid Function Tests: No results for input(s): "TSH", "T4TOTAL", "FREET4", "T3FREE", "THYROIDAB" in the last 72 hours. Anemia Panel: No results for input(s): "VITAMINB12", "FOLATE", "FERRITIN", "TIBC", "IRON", "RETICCTPCT" in the last 72 hours. Sepsis Labs: No results for input(s): "PROCALCITON", "LATICACIDVEN" in the last 168 hours.  Recent Results (from the past 240 hours)  Group A Strep by PCR     Status: None   Collection Time: 01/06/24  3:51 PM   Specimen: Throat; Sterile Swab  Result Value Ref Range  Status   Group A Strep by PCR NOT DETECTED NOT DETECTED Final    Comment: Performed at [facility], [address] [place], [place] [address]  Resp panel by RT-PCR (RSV, Flu A&B, Covid) Anterior Nasal Swab     Status: None   Collection Time: 01/06/24  3:51 PM   Specimen: Anterior Nasal Swab  Result Value Ref Range Status   SARS Coronavirus 2 by RT PCR NEGATIVE NEGATIVE Final    Comment: (NOTE) SARS-CoV-2 target nucleic acids are NOT DETECTED.  The SARS-CoV-2 RNA is generally detectable in upper respiratory specimens during the acute phase of infection. The lowest concentration of SARS-CoV-2 viral copies this assay can detect is 138 copies/mL. A negative result does not preclude SARS-Cov-2 infection and should not be used as the sole basis for treatment or other patient management decisions. A negative result may occur with  improper specimen collection/handling, submission of specimen other than nasopharyngeal swab, presence of viral mutation(s) within the areas targeted by this assay, and inadequate number of viral copies(<138 copies/mL). A negative result must be combined with clinical observations, patient history, and epidemiological  information. The expected result is Negative.  Fact Sheet for Patients:  BloggerCourse.com  Fact Sheet for Healthcare Providers:  SeriousBroker.it  This test is no t yet approved or cleared by the [place] FDA and  has been authorized for detection and/or diagnosis of SARS-CoV-2 by FDA under an Emergency Use Authorization (EUA). This EUA will remain  in effect (meaning this test can be used) for the duration of the COVID-19 declaration under Section 564(b)(1) of the Act, 21 U.S.C.section 360bbb-3(b)(1), unless the authorization is terminated  or revoked sooner.       Influenza A by PCR NEGATIVE NEGATIVE Final   Influenza B by PCR NEGATIVE NEGATIVE Final    Comment:  (NOTE) The Xpert Xpress SARS-CoV-2/FLU/RSV plus assay is intended as an aid in the diagnosis of influenza from Nasopharyngeal swab specimens and should not be used as a sole basis for treatment. Nasal washings and aspirates are unacceptable for Xpert Xpress SARS-CoV-2/FLU/RSV testing.  Fact Sheet for Patients: BloggerCourse.com  Fact Sheet for Healthcare Providers: SeriousBroker.it  This test is not yet approved or cleared by the [place] FDA and has been authorized for detection and/or diagnosis of SARS-CoV-2 by FDA under an Emergency Use Authorization (EUA). This EUA will remain in effect (meaning this test can be used) for the duration of the COVID-19 declaration under Section 564(b)(1) of the Act, 21 U.S.C. section 360bbb-3(b)(1), unless the authorization is terminated or revoked.     Resp Syncytial Virus by PCR NEGATIVE NEGATIVE Final    Comment: (NOTE) Fact Sheet for Patients: BloggerCourse.com  Fact Sheet for Healthcare Providers: SeriousBroker.it  This test is not yet approved or cleared by the [place] FDA and has been authorized for detection and/or diagnosis of SARS-CoV-2 by FDA under an Emergency Use Authorization (EUA). This EUA will remain in effect (meaning this test can be used) for the duration of the COVID-19 declaration under Section 564(b)(1) of the Act, 21 U.S.C. section 360bbb-3(b)(1), unless the authorization is terminated or revoked.  Performed at [facility], [address], [place], [place] [address]          Radiology Studies: MR Cervical Spine W and Wo Contrast Result Date: 01/07/2024 CLINICAL DATA:  Acute neck pain.  Known malignancy. EXAM: MRI CERVICAL SPINE WITHOUT AND WITH CONTRAST TECHNIQUE: Multiplanar and multiecho pulse sequences of the cervical spine, to include the craniocervical junction and cervicothoracic  junction, were obtained without and with intravenous contrast. CONTRAST:  7mL GADAVIST GADOBUTROL 1 MMOL/ML IV SOLN COMPARISON:  CT neck 01/07/2024 FINDINGS: Alignment: Physiologic. Vertebrae: No fracture, evidence of discitis, or bone lesion. Cord: There is abnormal hyperintense T2-weighted signal within the cervical spinal cord extending from C2-C5. There is intramedullary abnormal contrast enhancement within the spinal cord at the C2-3 level, measuring 0.8 x 0.7 x 1.5 cm. There is ventral and dorsal epidural contrast enhancement at the C1-4 levels. Posterior Fossa, vertebral arteries, paraspinal tissues: Mass within the right cervical soft tissues at the C3-4 level is better appreciated on the earlier CT. This mass likely extends through the right C3 neural foramen. Disc levels: No disc herniation, spinal canal stenosis or neural foraminal stenosis. The thecal sac is effaced at the levels of the above described epidural contrast enhancement. IMPRESSION: 1. Intramedullary abnormal contrast enhancement within the cervical spinal cord at the C2-3 level, measuring 0.8 x 0.7 x 1.5 cm, with abnormal hyperintense T2-weighted signal within the cervical spinal cord extending from C2-C5. Given history of head and neck carcinoma, this is concerning  for metastatic disease or direct invasion of the foraminal/epidural process described below. 2. Ventral and dorsal epidural contrast enhancement at the C1-4 levels, concerning for epidural spread of disease. The soft tissue lis in of the right neck likely extends through the right C3 neural foramen to affect the epidural space. 3. Mass within the right cervical soft tissues at the C3-4 level is better appreciated on the earlier CT. Electronically Signed   By: Deatra Robinson M.D.   On: 01/07/2024 21:28   CT Soft Tissue Neck W Contrast Result Date: 01/07/2024 CLINICAL DATA:  Non pulsatile neck mass. Personal history of head and neck cancer. Increasing pain and swelling. EXAM: CT  NECK WITH CONTRAST TECHNIQUE: Multidetector CT imaging of the neck was performed using the standard protocol following the bolus administration of intravenous contrast. RADIATION DOSE REDUCTION: This exam was performed according to the departmental dose-optimization program which includes automated exposure control, adjustment of the mA and/or kV according to patient size and/or use of iterative reconstruction technique. CONTRAST:  75mL OMNIPAQUE IOHEXOL 300 MG/ML  SOLN COMPARISON:  CT neck with contrast 10/04/2023 and 04/13/2023. FINDINGS: Pharynx and larynx: Nasopharynx is clear. Soft palate and tongue base are within normal limits. The oropharynx is clear. Vallecula and epiglottis are within normal limits. Aryepiglottic folds and piriform sinuses are clear. Vocal cords are midline and symmetric. Trachea is clear. Salivary glands: The submandibular glands are not discretely visualized. Parotid glands are within normal limits bilaterally. Thyroid: Atrophic Lymph nodes: No discrete adenopathy is present. 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 Minimal atherosclerotic changes are present at the carotid bifurcations without significant stenosis. Limited intracranial: Within normal limits. Visualized orbits: The globes and orbits are within normal limits. Mastoids and visualized paranasal sinuses: The paranasal sinuses and mastoid air cells are clear. Skeleton: Vertebral body heights and alignment are normal. No focal osseous lesions are present. Large dental caries and periapical lucencies are noted in the residual left maxillary molar. Upper chest: Scarring in the left upper lobe and postoperative changes are stable. Other: A progressive diffuse soft tissue mass present in the posterior right lateral neck. This extends to the right C2-3 and C3-4 neural foramina. Irregular soft tissue surrounds the spinal cord at both levels. A more discrete mass is present a posterior right spinal canal or possibly within the  spinal cord at C2-3. This demonstrates scratched at this is similar to the December study but has progressed since the July study. IMPRESSION: 1. Progressive diffuse soft tissue mass in the posterior right lateral neck. This extends to the right C2-3 and C3-4 neural foramina. Irregular soft tissue surrounds the spinal cord at both levels. 2. A more discrete mass is present a posterior right spinal canal or possibly within the spinal cord at C2-3. This is similar to the December study but has progressed since the July study. MRI of the cervical spine without and with contrast would be useful for further evaluation. 3. No discrete adenopathy. 4. Large dental caries and periapical lucencies in the residual left maxillary molar. 5. Stable scarring in the left upper lobe and postoperative changes. These results were called by telephone at the time of interpretation on 01/07/2024 at 4:39 pm to provider [facility] , who verbally acknowledged these results. Electronically Signed   By: Marin Roberts M.D.   On: 01/07/2024 16:50        Scheduled Meds:  acetaminophen  650 mg Oral Q4H   baclofen  5 mg Oral TID   celecoxib  200 mg  Oral BID   Chlorhexidine Gluconate Cloth  6 each Topical Daily   dexamethasone  8 mg Intravenous Q12H   feeding supplement  237 mL Oral BID BM   HYDROmorphone   Intravenous Q4H   methadone  10 mg Oral Q8H   nicotine  14 mg Transdermal Daily   polyethylene glycol  17 g Oral Daily   senna-docusate  2 tablet Oral BID   sodium chloride flush  10-40 mL Intracatheter Q12H   sodium chloride flush  3 mL Intravenous Q12H   Continuous Infusions:        Glade Lloyd, MD [facility] 01/09/2024, 8:01 AM

## 2024-01-09 NOTE — Progress Notes (Signed)
 Messaged pharmacy to see if they could start his new lower dose of decadron now since he refused earlier higher dose

## 2024-01-10 ENCOUNTER — Encounter: Payer: Self-pay | Admitting: Hematology and Oncology

## 2024-01-10 DIAGNOSIS — F418 Other specified anxiety disorders: Secondary | ICD-10-CM

## 2024-01-10 DIAGNOSIS — C119 Malignant neoplasm of nasopharynx, unspecified: Secondary | ICD-10-CM

## 2024-01-10 LAB — BASIC METABOLIC PANEL WITH GFR
Anion gap: 10 (ref 5–15)
BUN: 20 mg/dL (ref 6–20)
CO2: 23 mmol/L (ref 22–32)
Calcium: 9 mg/dL (ref 8.9–10.3)
Chloride: 99 mmol/L (ref 98–111)
Creatinine, Ser: 0.74 mg/dL (ref 0.61–1.24)
GFR, Estimated: >60 mL/min (ref >60)
Glucose, Bld: 216 mg/dL — ABNORMAL HIGH (ref 70–99)
Potassium: 3.7 mmol/L (ref 3.5–5.1)
Sodium: 132 mmol/L — ABNORMAL LOW (ref 135–145)

## 2024-01-10 LAB — MAGNESIUM: Magnesium: 1.5 mg/dL — ABNORMAL LOW (ref 1.7–2.4)

## 2024-01-10 MED ORDER — MAGNESIUM SULFATE 2 GM/50ML IV SOLN
2.0000 g | Freq: Once | INTRAVENOUS | Status: AC
  Administered 2024-01-10: 2 g via INTRAVENOUS
  Filled 2024-01-10: qty 50

## 2024-01-10 MED ORDER — BACLOFEN 10 MG PO TABS
5.0000 mg | ORAL_TABLET | Freq: Three times a day (TID) | ORAL | Status: AC | PRN
  Administered 2024-01-10 – 2024-01-11 (×2): 5 mg via ORAL
  Filled 2024-01-10 (×2): qty 1

## 2024-01-10 MED ORDER — CELECOXIB 200 MG PO CAPS
200.0000 mg | ORAL_CAPSULE | Freq: Two times a day (BID) | ORAL | Status: DC
  Filled 2024-01-10 (×2): qty 1

## 2024-01-10 NOTE — Progress Notes (Signed)
 Pt is refusing CO2 monitor. Wife is at bedside and educated on the importance of wearing it in case of any emergency. As protocol, RN's must check Q4 hours and document it in the [facility]. Pt says he rather have the PCA off than wear the CO2 monitor because it "literally drives him crazy". MD Lyanne Co notified.

## 2024-01-10 NOTE — Progress Notes (Signed)
 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  On presentation, CT of neck showed progressive diffuse soft tissue mass in the posterior right lateral neck surrounding the spinal cord with recommendations of MRI of cervical spine.  Patient received IV analgesics in the ED but continued to have pain.  MRI of cervical spine spine showed findings concerning for metastatic disease or direct invasion of the foraminal/epidural process.  Oncology/palliative care and radiation oncology has been consulted.  Assessment & Plan:   Severe neck pain Recurrent head and neck cancer with history of metastatic disease to the lungs treated with chemotherapy Cancer associated pain -Presented with worsening neck pain. -CT of neck showed progressive diffuse soft tissue mass in the posterior right lateral neck surrounding the spinal cord with recommendations of MRI of cervical spine.  MRI of cervical spine showed findings concerning for metastatic disease or direct invasion of the foraminal/epidural process - Continue current pain management regimen as per palliative care.  Continue Decadron.  Continue bowel regimen.  Oncology following.  Awaiting radiation oncology recommendations regarding need for radiation.  Anemia of chronic disease - From cancer.  Hemoglobin currently stable.  No signs of bleeding.  Monitor  Hyponatremia - Monitor.  Encourage oral intake  Hypokalemia - Improved  Hypomagnesemia -Replace.  Repeat a.m. labs  Anxiety - Patient was on Ativan 0.5 mg twice a day at home.  Complained of worsening anxiety: Currently on Ativan 0.5 mg every 6 hours as needed IV.  DVT  prophylaxis: SCDs Code Status: Full Family Communication: Sister at bedside on 01/09/2024 Disposition Plan: Status is: Inpatient Remains inpatient appropriate because: Of severity of illness    Consultants: Oncology/palliative care/radiation oncology  Procedures: None  Antimicrobials: None   Subjective: Patient seen and examined at bedside.  Continues to have neck pain but feels slightly better.  Has intermittent anxiety.  No fever or vomiting reported.   Objective: Vitals:   01/09/24 2222 01/09/24 2338 01/10/24 0419 01/10/24 0515  BP: 111/62   105/67  Pulse: 66   92  Resp: 14 16 16 16   Temp: 98.3 F (36.8 C)   97.8 F (36.6 C)  TempSrc: Oral   Oral  SpO2: 98% 98% 99% 100%  Weight:    75.4 kg  Height:        Intake/Output Summary (Last 24 hours) at 01/10/2024 0750 Last data filed at 01/09/2024 2000 Gross per 24 hour  Intake 120 ml  Output --  Net 120 ml   Filed Weights   01/07/24 2025 01/09/24 0501 01/10/24 0515  Weight: 75.4 kg 75.4 kg 75.4 kg    Examination:  General: No acute distress.  Currently on room air.  Chronically ill and deconditioned looking. ENT/neck: No palpable neck masses or elevated JVD noted respiratory: Bilateral decreased breath sounds at bases with scattered crackles  CVS: Rate mostly controlled; S1 and S2 are heard  abdominal: Soft, nontender, distended mildly; no organomegaly, bowel sounds are heard normally Extremities: No clubbing; mild lower extremity edema present CNS: Awake, remains slow to respond.  No focal neurologic deficit.  Able to move extremities Lymph: No obvious palpable lymphadenopathy Skin: No obvious petechiae or rashes psych: Show no signs of agitation.  Flat  affect mostly musculoskeletal: No obvious joint erythema/tenderness    Data Reviewed: I have personally reviewed following labs and imaging studies  CBC: Recent Labs  Lab 01/04/24 0940 01/06/24 1550 01/07/24 1319 01/08/24 0532  WBC 5.1 5.8 7.5 5.1   NEUTROABS 4.2 4.8 6.0  --   HGB 11.6* 11.1* 11.1* 11.1*  HCT 35.3* 33.0* 32.9* 34.0*  MCV 86.9 85.3 85.7 86.7  PLT 268 207 192 167   Basic Metabolic Panel: Recent Labs  Lab 01/06/24 1550 01/07/24 1319 01/08/24 0532 01/09/24 0500 01/10/24 0550  NA 131* 134* 134* 130* 132*  K 3.5 3.3* 3.4* 3.5 3.7  CL 97* 99 98 97* 99  CO2 24 26 26 24 23   GLUCOSE 105* 102* 97 139* 216*  BUN 20 22* 19 17 20   CREATININE 0.68 0.73 0.78 0.78 0.74  CALCIUM 9.0 9.3 8.9 9.2 9.0  MG  --   --   --  1.6* 1.5*   GFR: Estimated Creatinine Clearance: 113 mL/min (by C-G formula based on SCr of 0.74 mg/dL). Liver Function Tests: Recent Labs  Lab 01/04/24 0940 01/06/24 1550 01/07/24 1319  AST 26 27 28   ALT 15 17 18   ALKPHOS 57 47 44  BILITOT 0.4 0.6 0.6  PROT 7.6 7.0 6.9  ALBUMIN 4.7 4.0 4.0   No results for input(s): "LIPASE", "AMYLASE" in the last 168 hours. No results for input(s): "AMMONIA" in the last 168 hours. Coagulation Profile: Recent Labs  Lab 01/08/24 0532  INR 1.3*   Cardiac Enzymes: No results for input(s): "CKTOTAL", "CKMB", "CKMBINDEX", "TROPONINI" in the last 168 hours. BNP (last 3 results) No results for input(s): "PROBNP" in the last 8760 hours. HbA1C: No results for input(s): "HGBA1C" in the last 72 hours. CBG: No results for input(s): "GLUCAP" in the last 168 hours. Lipid Profile: No results for input(s): "CHOL", "HDL", "LDLCALC", "TRIG", "CHOLHDL", "LDLDIRECT" in the last 72 hours. Thyroid Function Tests: No results for input(s): "TSH", "T4TOTAL", "FREET4", "T3FREE", "THYROIDAB" in the last 72 hours. Anemia Panel: No results for input(s): "VITAMINB12", "FOLATE", "FERRITIN", "TIBC", "IRON", "RETICCTPCT" in the last 72 hours. Sepsis Labs: No results for input(s): "PROCALCITON", "LATICACIDVEN" in the last 168 hours.  Recent Results (from the past 240 hours)  Group A Strep by PCR     Status: None   Collection Time: 01/06/24  3:51 PM   Specimen: Throat; Sterile Swab   Result Value Ref Range Status   Group A Strep by PCR NOT DETECTED NOT DETECTED Final    Comment: Performed at [facility], [address] [place], [place] [address]  Resp panel by RT-PCR (RSV, Flu A&B, Covid) Anterior Nasal Swab     Status: None   Collection Time: 01/06/24  3:51 PM   Specimen: Anterior Nasal Swab  Result Value Ref Range Status   SARS Coronavirus 2 by RT PCR NEGATIVE NEGATIVE Final    Comment: (NOTE) SARS-CoV-2 target nucleic acids are NOT DETECTED.  The SARS-CoV-2 RNA is generally detectable in upper respiratory specimens during the acute phase of infection. The lowest concentration of SARS-CoV-2 viral copies this assay can detect is 138 copies/mL. A negative result does not preclude SARS-Cov-2 infection and should not be used as the sole basis for treatment or other patient management decisions. A negative result may occur with  improper specimen collection/handling, submission of specimen other than nasopharyngeal swab, presence of viral mutation(s) within the areas targeted by this assay, and inadequate number of viral copies(<138 copies/mL). A negative result must be  combined with clinical observations, patient history, and epidemiological information. The expected result is Negative.  Fact Sheet for Patients:  BloggerCourse.com  Fact Sheet for Healthcare Providers:  SeriousBroker.it  This test is no t yet approved or cleared by the [place] FDA and  has been authorized for detection and/or diagnosis of SARS-CoV-2 by FDA under an Emergency Use Authorization (EUA). This EUA will remain  in effect (meaning this test can be used) for the duration of the COVID-19 declaration under Section 564(b)(1) of the Act, 21 U.S.C.section 360bbb-3(b)(1), unless the authorization is terminated  or revoked sooner.       Influenza A by PCR NEGATIVE NEGATIVE Final   Influenza B by PCR NEGATIVE NEGATIVE  Final    Comment: (NOTE) The Xpert Xpress SARS-CoV-2/FLU/RSV plus assay is intended as an aid in the diagnosis of influenza from Nasopharyngeal swab specimens and should not be used as a sole basis for treatment. Nasal washings and aspirates are unacceptable for Xpert Xpress SARS-CoV-2/FLU/RSV testing.  Fact Sheet for Patients: BloggerCourse.com  Fact Sheet for Healthcare Providers: SeriousBroker.it  This test is not yet approved or cleared by the [place] FDA and has been authorized for detection and/or diagnosis of SARS-CoV-2 by FDA under an Emergency Use Authorization (EUA). This EUA will remain in effect (meaning this test can be used) for the duration of the COVID-19 declaration under Section 564(b)(1) of the Act, 21 U.S.C. section 360bbb-3(b)(1), unless the authorization is terminated or revoked.     Resp Syncytial Virus by PCR NEGATIVE NEGATIVE Final    Comment: (NOTE) Fact Sheet for Patients: BloggerCourse.com  Fact Sheet for Healthcare Providers: SeriousBroker.it  This test is not yet approved or cleared by the [place] FDA and has been authorized for detection and/or diagnosis of SARS-CoV-2 by FDA under an Emergency Use Authorization (EUA). This EUA will remain in effect (meaning this test can be used) for the duration of the COVID-19 declaration under Section 564(b)(1) of the Act, 21 U.S.C. section 360bbb-3(b)(1), unless the authorization is terminated or revoked.  Performed at [facility], [address] [place], [place] [address]          Radiology Studies: No results found.       Scheduled Meds:  acetaminophen  650 mg Oral Q4H   celecoxib  200 mg Oral BID   Chlorhexidine Gluconate Cloth  6 each Topical Daily   dexamethasone  6 mg Intravenous Q12H   feeding supplement  237 mL Oral BID BM   HYDROmorphone   Intravenous Q4H    methadone  10 mg Oral Q8H   nicotine  14 mg Transdermal Daily   polyethylene glycol  17 g Oral Daily   senna-docusate  2 tablet Oral BID   sodium chloride flush  10-40 mL Intracatheter Q12H   sodium chloride flush  3 mL Intravenous Q12H   Continuous Infusions:        Glade Lloyd, MD [facility] 01/10/2024, 7:50 AM

## 2024-01-10 NOTE — Progress Notes (Signed)
 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, with history of metastatic disease to the lungs but with no residual signs of disease in his lungs on recent imaging studies Typically, he tends to relapse on the right side of his neck  Progressed on 2 courses of radiation and multiple treatment including Nivolumab, capecitabine, pembrolizumab, gemzar, cisplatin and toripalimab, cetuximab, oxaliplatin, carboplatin and paclitaxol  Repeat recent imaging showed disease progression At this point, I do not have any other treatment available for him from systemic standpoint The patient is interested to go to [place] for proton therapy His family is also keen to take him to the [facility] for second opinion At this point, even if he gets excepted as a patient, he would likely only qualify for phase 1 clinical trial There is no guarantee he will be accepted as a patient but the family is willing to try  Cancer associated pain Appreciate palliative care consult team to assist in management  Anxiety, generalized He has profound anxiety He was started on Effexor in the outpatient clinic I will defer to palliative care team for assistance  Goals of care discussion Neither the patient and family members are willing to accept palliative care with hospice They still want to try to get treatment elsewhere Once the patient is stabilized on pain control, he can be discharged Appreciate assistant from radiation team to arrange for consult in Blaine Hamper, MD 01/10/2024 10:41 AM  Subjective:  Patient is well-known to me.  Was admitted to the hospital late last week due to pain.  He also suffered from major anxiety in the last few weeks.  He just finished treatment a week ago.  His wife and sister were present in the room and there were significant argument between  them about what the patient needs The patient once to be referred somewhere for consideration for additional treatment and is not willing to accept palliative care yet He stated his pain is not well-controlled with the PCA His wife needs assistance to complete FMLA paperwork to get her off work so that she can focus on taking care of him in the next few months  I personally reviewed his CT imaging of the neck, MRI as well as blood work today  Objective:  Vitals:   01/10/24 0419 01/10/24 0515  BP:  105/67  Pulse:  92  Resp: 16 16  Temp:  97.8 F (36.6 C)  SpO2: 99% 100%     Intake/Output Summary (Last 24 hours) at 01/10/2024 1041 Last data filed at 01/09/2024 2000 Gross per 24 hour  Intake 120 ml  Output --  Net 120 ml   Oncology History Overview Note  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 Pan-endoscopy elsewhere showed cancer from nasopharyngeal space.   01/04/2007 -  02/20/2007 Chemotherapy   He received 2 cycles of cisplatin and 5FU followed by concurrent chemo with weekly cisplatin and radiation. He only received 2 doses of chemo due to severe mucositis, nausea and weight loss.   04/05/2007 - 08/04/2007 Chemotherapy   He received 4 more courses of cisplatin with 5FU and had complete response   07/05/2009 Procedure   Fine-needle aspirate of the right level II lymph nodes come from recurrent metastatic disease. Repeat  endoscopy and CT scan show no evidence of disease elsewhere.   07/08/2009 - 12/02/2009 Chemotherapy   He was given 6 cycles of carboplatin, 5-FU and docetaxel   12/03/2009 Surgery   He has surgery to the residual lymph node on the right neck which showed no evidence of disease.   02/22/2012 Imaging   Repeat imaging study showed large recurrent mass. He was referred elsewhere for further treatment.   05/03/2012 Surgery   He underwent left upper lobectomy.   04/29/2013 Imaging   PEt scan showed lesion on right level II B and lower lung was abnormal   06/03/2013 - 02/02/2014 Chemotherapy   He had 6 cycles of chemotherapy when he was found to have recurrence of cancer and had received oxaliplatin and capecitabine   06/07/2014 Imaging   PET CT scan showed persistent disease in the right neck lymph nodes and left lung   06/29/2014 Procedure   Accession: [id number] repeat LUL biopsy confirmed metastatic cancer   07/18/2014 - 07/31/2014 Radiation Therapy   He received palliative radiation therapy to the lungs   10/10/2014 Imaging   CT scan of the chest, abdomen and pelvis show regression in the size of the lung nodule in the left upper lobe and stable pulmonary nodules   01/24/2015 Imaging   CT scan showed stable disease in neck and lung   06/19/2015 Imaging   CT scan of the neck and the chest show possible mild progression of the nodule in the right side of the neck.   06/25/2015 Imaging   PET scan confirmed disease recurrence in the neck   07/07/2015 Imaging   He had MRI neck at [facility]   09/03/2015 - 08/26/2018 Chemotherapy   He received palliative chemo with Nivolumab   10/29/2015 Imaging   PET CT showed positive response to Rx   02/28/2016 Imaging   Ct abdomen showed abnormal thinkening in his stomach   03/03/2016 Imaging   CT: Right sternocleidomastoid muscle metastasis appears less distinct but otherwise not significantly changed in size or configuration since 06/19/2015.2. Left level 3  lymph node which was hypermetabolic by PET-CT in January 2017 appears slightly smaller   04/01/2016 Imaging   CT cervical spine showed no acute fracture or traumatic malalignment in the cervical spine   04/22/2016 Procedure   Port-a-cath placed.   06/16/2016 Imaging   Ct neck showed right sternocleidomastoid muscle metastasis is further decreased in conspicuity since May, and has mildly decreased in size since September 2016. Continued stability of sub-centimeter left cervical lymph nodes. No new or progressive metastatic disease in the neck.   06/16/2016 Imaging   CT chest showed stable masslike radiation fibrosis in the left upper lobe. Stable subcentimeter pulmonary nodules in the bilateral lower lobes. No new or progressive metastatic disease in the chest. Nonobstructing left renal stone.   10/13/2016 Imaging   Ct neck showed unchanged right sternocleidomastoid muscle metastasis. Unchanged subcentimeter left cervical lymph nodes. No evidence of new or progressive metastatic disease in the neck.   10/13/2016 Imaging   CT chest  showed tiny hypervascular foci in the liver, not definitely seen on prior imaging of 06/16/2016 and 02/28/2016. Abdomen MRI without and with contrast recommended to further evaluate as metastatic disease is a concern. 2. Stable appearance of post treatment changes left upper lung and scattered tiny bilateral pulmonary nodules.   02/11/2017 Imaging   Ct neck: Lymph node mass right posterior neck appears improved from the prior study. Small posterior lymph nodes on the left unchanged. Occluded right jugular vein unchanged.   02/11/2017 Imaging   1. Similar appearance of postsurgical and radiation changes in the left upper lobe. 2. Similar bilateral pulmonary nodules. 3. No thoracic adenopathy. 4. Subtle foci of post-contrast enhancement within the liver are suboptimally characterized on this nondedicated study. Likely similar. These could either be re-evaluated at followup  or more entirely characterized with abdominal MRI. 5. Left nephrolithiasis.   05/19/2017 Imaging   Matted lymph node mass right posterior neck appears larger in the recent CT. Accurate measurements difficult due to infiltrating tumor margins and infiltration of the muscle. Right jugular vein again appears occluded or resected. Small left posterior lymph nodes stable. Left upper lobe airspace density stable and similar to the prior CT   06/03/2017 PET scan   1. Hypermetabolic ill-defined right level IIb lymph node, about 1.3 cm in diameter with maximum SUV 9.5 (formerly 8.1). Appearance suspicious for residual/recurrent malignancy. No worrisome left-sided lesion. 2. Left suprahilar indistinct opacity demonstrates no worrisome hypermetabolic activity. The 5 mm left lower lobe pulmonary nodule is stable and not currently hypermetabolic although below sensitive PET-CT size thresholds. 3. Other imaging findings of potential clinical significance: Bilateral nonobstructive nephrolithiasis. Chronic bilateral maxillary sinusitis.   05/11/2018 PET scan   1. Continued chronic accentuated metabolic activity in the vicinity of right level IIB and the adjacent right sternocleidomastoid muscle, with ill definition of surrounding tissue planes. Maximum SUV is currently 8.1, formerly 9.5. Accentuated metabolic activity is been present in this vicinity back through 06/25/2015, and there was also some low-level activity in this vicinity on 06/07/2014. Some of this may be from scarring and local muscular activity although clearly a component of residual tumor is difficult to exclude given the focally high activity. 2. Other imaging findings of potential clinical significance: Chronic bilateral maxillary sinusitis. Chronic scarring in the left upper lobe. Chronically stable 5 mm left lower lobe nodule is considered benign. Nonobstructive left nephrolithiasis.   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 COMMENT:No significant keratinization is identified. Other basaloid carcinomas are in the differential diagnosis. No cell block material is available for further testing.   09/12/2018 Procedure   He underwent fine Needle Aspiration   10/04/2018 PET scan   1. Significant progression of local recurrence laterally in the mid right neck with an enlarging, increasingly hypermetabolic soft tissue mass. This involves the right sternocleidomastoid muscle. 2. Small lymph nodes in the right axilla are increasingly hypermetabolic. These are nonspecific and potentially reactive, although could reflect a small metastases. Small hypermetabolic nodule in the left suprasternal notch is unchanged. 3. No other evidence of metastatic disease.     10/07/2018 - 12/23/2018 Chemotherapy   The patient had cisplatin plus gemzar   12/07/2018 Imaging   1. Decreased size of lateral right neck mass. 2. Unchanged soft tissue nodule in the suprasternal notch. 3. No evidence of new metastatic disease in the neck.  05/30/2019 Imaging   CT neck No clear change or progression compared to the study of March. Overall measurements of the right lateral neck mass are similar, approximately 3 x 1.8 cm. See above discussion. One could argue that there is slight increase in lateral bulging, possibly with an increase in contrast enhancement, towards the inferior margin. This is of questionable validity but could possibly represent some progression or inflammatory change. Other findings in the region are stable.   07/20/2019 - 09/15/2019 Chemotherapy   The patient had gemzar and cisplatin   10/02/2019 Imaging   CT neck As compared to 05/30/2019, no significant interval change in size of an ill-defined mass within the right lateral neck, again measuring 3.3 x 1.8 cm in transaxial dimensions.   Unchanged  mildly enlarged left level I lymph node measuring 1.1 cm in short axis.   Unchanged node or nodule at the thoracic inlet, measuring 1.3 x 0.8 cm.   Please refer to concurrently performed chest CT for a description of findings below the level of the thoracic inlet.     10/02/2019 Imaging   CT chest 1. No new or progressive findings in the chest to suggest metastatic disease. 2. Bilateral subcentimeter solid pulmonary nodules are stable since 2018. 3. Hyperdense 1.1 cm anterior liver focus, not clearly visualized on prior studies. Suggest MRI abdomen without and with IV contrast for further characterization.   10/20/2019 - 12/29/2019 Chemotherapy   The patient had gemzar maintenance   06/12/2020 Imaging   1. Enlarging superficial, exophytic component of the chronic right sternocleidomastoid muscle mass. See series 6, image 55. 2. Elsewhere stable CT appearance of the Neck.   06/12/2020 Imaging   Post treatment scarring in the left hemithorax, stable. No evidence recurrent or metastatic disease   06/14/2020 - 10/15/2020 Chemotherapy   He received carboplatin, 5FU and Rande Lawman       10/31/2020 Procedure   Interval improvement in right lateral lymph node mass. Improvement in dermal component as well as invasion of the right sternocleidomastoid muscle.   10 mm submental lymph node slightly enlarged compared to the prior study. Continued follow-up recommended.   11/01/2020 - 05/22/2022 Chemotherapy   Patient is on Treatment Plan : HEAD/NECK Pembrolizumab Q21D     07/03/2022 - 10/30/2022 Chemotherapy   Patient is on Treatment Plan : Head and neck Pembrolizumab (400) q42d     11/30/2022 Imaging   CT chest  1. Stable exam. No new or progressive findings to suggest recurrent or metastatic disease. 2. Stable tiny bilateral nodules since 06/12/2020, consistent with benign etiology. 3. Punctate nonobstructing left renal stone.   12/01/2022 Imaging   CT neck  Growing right neck mass since 2022  with epidural tumor extension via the right C2-3 foramen. Cervical MRI with contrast would be contributory.   12/11/2022 - 04/02/2023 Chemotherapy   Patient is on Treatment Plan : HEAD/NECK toripalimab-tpzi D1 + cisplatin D1 + gemcitabine D1,8 q21d x 6 cycles / toripalimab-tpzi q21d (up to 23m)     04/15/2023 Imaging   CT Chest W Contrast  Result Date: 04/15/2023 CLINICAL DATA:  Right neck mass, nasopharyngeal carcinoma EXAM: CT CHEST WITH CONTRAST TECHNIQUE: Multidetector CT imaging of the chest was performed during intravenous contrast administration. RADIATION DOSE REDUCTION: This exam was performed according to the departmental dose-optimization program which includes automated exposure control, adjustment of the mA and/or kV according to patient size and/or use of iterative reconstruction technique. CONTRAST:  75mL OMNIPAQUE IOHEXOL 300 MG/ML  SOLN COMPARISON:  11/27/2022 FINDINGS: Cardiovascular:  Right Port-A-Cath tip: Right atrium. Mediastinum/Nodes: Small left supraclavicular lymph node 0.5 cm in short axis on image 4 series 3, formerly the same. No pathologic adenopathy in the chest. Lungs/Pleura: Biapical pleuroparenchymal scarring, right greater than left. Stable left upper lobe scarring and peribronchovascular density with wedge resection line in the left upper lobe, no significant contour change of density in this vicinity to suggest malignancy. Stable 5 by 4 mm left lower lobe nodule and stable 3 by 4 mm right lower lobe nodule, unchanged from at least 2021, compatible with benign etiology. No new lesion identified. Upper Abdomen: 2 mm left kidney upper pole nonobstructive renal calculus, image 43 series 7. Musculoskeletal: Unremarkable IMPRESSION: 1. Stable postoperative findings in the left upper lobe. 2. Stable small bilateral lower lobe pulmonary nodules, unchanged from at least 2021, compatible with benign etiology. 3. 2 mm left kidney upper pole nonobstructive renal calculus. Electronically  Signed   By: Gaylyn Rong M.D.   On: 04/15/2023 14:03   CT Soft Tissue Neck W Contrast  Result Date: 04/15/2023 CLINICAL DATA:  Head/neck cancer.  Last chemo 04/02/2023. EXAM: CT NECK WITH CONTRAST TECHNIQUE: Multidetector CT imaging of the neck was performed using the standard protocol following the bolus administration of intravenous contrast. RADIATION DOSE REDUCTION: This exam was performed according to the departmental dose-optimization program which includes automated exposure control, adjustment of the mA and/or kV according to patient size and/or use of iterative reconstruction technique. CONTRAST:  75mL OMNIPAQUE IOHEXOL 300 MG/ML  SOLN COMPARISON:  CT neck 11/27/2022. FINDINGS: Pharynx and larynx: There is probable mucoid debris in the right nasal cavity/nasopharynx. The nasal cavity and nasopharynx are otherwise unremarkable. The oral cavity and oropharynx are unremarkable. The parapharyngeal spaces are clear. The hypopharynx and larynx are unremarkable. The epiglottis is normal. There is no retropharyngeal collection. The airway is patent. Salivary glands: Atrophy of the parotid and left submandibular glands is unchanged. The right submandibular gland is again not identified and may be surgically absent. Thyroid: Unremarkable. Lymph nodes: The infiltrative mass in the right neck is again seen. The mass invades through the right C2-C3 neural foramen into the epidural space resulting in mild narrowing of the thecal sac without suspected cord compression. The degree of intraspinal epidural tumor appears slightly increased compared to the study from 11/27/2022. Mass again encases the vertebral artery at the C2-C3 level (4-36), unchanged. The remainder of the infiltrative mass in the paraspinal musculature with extension into the overlying skin surface does not appear significantly changed in size or extent compared to the prior study. A 0.9 cm midline level I lymph node is unchanged going back to  2021. Otherwise, there is no new or progressive lymphadenopathy in the neck. Vascular: As above, there is encasement of the right vertebral artery at the C2-C3 level by the right neck mass. There is mild calcified plaque at the carotid bifurcations. The right IJ is occluded or sacrificed, unchanged. The left IJ is patent. A right chest wall port is partially imaged. Limited intracranial: Unremarkable. Visualized orbits: Unremarkable. Mastoids and visualized paranasal sinuses: There is mild mucosal thickening in the maxillary sinuses. The mastoid air cells and middle ear cavities are clear. Skeleton: There is no acute osseous abnormality or suspicious osseous lesion. Upper chest: Assessed on the separately dictated CT chest. Other: None. IMPRESSION: 1. Infiltrative mass again seen in the right neck centered in the paraspinal musculature with invasion through the right C2-C3 neural foramen. The bulk of the epidural tumor at this level appears slightly increased  in size compared to the prior study from 11/27/2022 with probable leftward displacement of the cord but without frank cord compression. The remainder of the mass in the right neck is otherwise not significantly changed. 2. No new or progressive lymphadenopathy in the remainder of the neck. 3. Probable mucoid debris in the right nasal cavity/nasopharynx. No suspicious mass lesion or enhancement in the nasopharynx. Electronically Signed   By: Lesia Hausen M.D.   On: 04/15/2023 14:02      05/07/2023 - 09/10/2023 Chemotherapy   Patient is on Treatment Plan : HEAD/NECK Cetuximab q14d + Carboplatin + 5FU IVCI D1-4 q21d x 6 cycles / Cetuximab q14d     10/01/2023 - 01/04/2024 Chemotherapy   Patient is on Treatment Plan : HEAD/NECK Carboplatin + Paclitaxel q21d     10/04/2023 Imaging   CT Soft Tissue Neck W Contrast Result Date: 10/04/2023 CLINICAL DATA:  Neck mass, nonpulsatile neck swelling EXAM: CT NECK WITH CONTRAST TECHNIQUE: Multidetector CT imaging of the  neck was performed using the standard protocol following the bolus administration of intravenous contrast. RADIATION DOSE REDUCTION: This exam was performed according to the departmental dose-optimization program which includes automated exposure control, adjustment of the mA and/or kV according to patient size and/or use of iterative reconstruction technique. CONTRAST:  75mL OMNIPAQUE IOHEXOL 300 MG/ML  SOLN COMPARISON:  CT neck 04/13/2023. FINDINGS: Pharynx and larynx: Unremarkable nasopharynx. Salivary glands: Atrophic.  No evidence of acute abnormality. Thyroid: Normal. Lymph nodes: The infiltrative mass in the right neck is slightly decreased in size/bulk, now measuring approximately 3.7 x 2.8 cm. 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 The tumor extends superficially to the skin surface, similar to prior. Mildly increased central areas of necrosis/cystic change within the mass. Stable midline 9 mm level I lymph node. No new or progressive lymphadenopathy. 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 Calcific atherosclerosis. Limited intracranial: No obvious acute abnormality on limited assessment and partial visualization. Visualized orbits: Negative. Mastoids and visualized paranasal sinuses: Clear. Skeleton: No acute or aggressive process. Upper chest: Chronic postoperative changes in the left upper lobe, better characterized on prior CT of the chest. IMPRESSION: 1. 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 2. No new or progressive lymphadenopathy. Electronically Signed   By: Feliberto Harts M.D.   On: 10/04/2023 17:15      01/07/2024 Imaging   MR Cervical Spine W and Wo Contrast Result Date: 01/07/2024 CLINICAL DATA:  Acute neck pain.  Known malignancy. EXAM: MRI CERVICAL SPINE WITHOUT AND WITH CONTRAST  TECHNIQUE: Multiplanar and multiecho pulse sequences of the cervical spine, to include the craniocervical junction and cervicothoracic junction, were obtained without and with intravenous contrast. CONTRAST:  7mL GADAVIST GADOBUTROL 1 MMOL/ML IV SOLN COMPARISON:  CT neck 01/07/2024 FINDINGS: Alignment: Physiologic. Vertebrae: No fracture, evidence of discitis, or bone lesion. Cord: There is abnormal hyperintense T2-weighted signal within the cervical spinal cord extending from C2-C5. There is intramedullary abnormal contrast enhancement within the spinal cord at the C2-3 level, measuring 0.8 x 0.7 x 1.5 cm. There is ventral and dorsal epidural contrast enhancement at the C1-4 levels. Posterior Fossa, vertebral arteries, paraspinal tissues: Mass within the right cervical soft tissues at the C3-4 level is better appreciated on the earlier CT. This mass likely extends through the right C3 neural foramen. Disc levels: No disc herniation, spinal canal stenosis  or neural foraminal stenosis. The thecal sac is effaced at the levels of the above described epidural contrast enhancement. IMPRESSION: 1. Intramedullary abnormal contrast enhancement within the cervical spinal cord at the C2-3 level, measuring 0.8 x 0.7 x 1.5 cm, with abnormal hyperintense T2-weighted signal within the cervical spinal cord extending from C2-C5. Given history of head and neck carcinoma, this is concerning for metastatic disease or direct invasion of the foraminal/epidural process described below. 2. Ventral and dorsal epidural contrast enhancement at the C1-4 levels, concerning for epidural spread of disease. The soft tissue lis in of the right neck likely extends through the right C3 neural foramen to affect the epidural space. 3. Mass within the right cervical soft tissues at the C3-4 level is better appreciated on the earlier CT. Electronically Signed   By: Deatra Robinson M.D.   On: 01/07/2024 21:28   CT Soft Tissue Neck W Contrast Result  Date: 01/07/2024 CLINICAL DATA:  Non pulsatile neck mass. Personal history of head and neck cancer. Increasing pain and swelling. EXAM: CT NECK WITH CONTRAST TECHNIQUE: Multidetector CT imaging of the neck was performed using the standard protocol following the bolus administration of intravenous contrast. RADIATION DOSE REDUCTION: This exam was performed according to the departmental dose-optimization program which includes automated exposure control, adjustment of the mA and/or kV according to patient size and/or use of iterative reconstruction technique. CONTRAST:  75mL OMNIPAQUE IOHEXOL 300 MG/ML  SOLN COMPARISON:  CT neck with contrast 10/04/2023 and 04/13/2023. FINDINGS: Pharynx and larynx: Nasopharynx is clear. Soft palate and tongue base are within normal limits. The oropharynx is clear. Vallecula and epiglottis are within normal limits. Aryepiglottic folds and piriform sinuses are clear. Vocal cords are midline and symmetric. Trachea is clear. Salivary glands: The submandibular glands are not discretely visualized. Parotid glands are within normal limits bilaterally. Thyroid: Atrophic Lymph nodes: No discrete adenopathy is present. 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 Minimal atherosclerotic changes are present at the carotid bifurcations without significant stenosis. Limited intracranial: Within normal limits. Visualized orbits: The globes and orbits are within normal limits. Mastoids and visualized paranasal sinuses: The paranasal sinuses and mastoid air cells are clear. Skeleton: Vertebral body heights and alignment are normal. No focal osseous lesions are present. Large dental caries and periapical lucencies are noted in the residual left maxillary molar. Upper chest: Scarring in the left upper lobe and postoperative changes are stable. Other: A progressive diffuse soft tissue mass present in the posterior right lateral neck. This extends to the right C2-3 and C3-4 neural foramina. Irregular soft  tissue surrounds the spinal cord at both levels. A more discrete mass is present a posterior right spinal canal or possibly within the spinal cord at C2-3. This demonstrates scratched at this is similar to the December study but has progressed since the July study. IMPRESSION: 1. Progressive diffuse soft tissue mass in the posterior right lateral neck. This extends to the right C2-3 and C3-4 neural foramina. Irregular soft tissue surrounds the spinal cord at both levels. 2. A more discrete mass is present a posterior right spinal canal or possibly within the spinal cord at C2-3. This is similar to the December study but has progressed since the July study. MRI of the cervical spine without and with contrast would be useful for further evaluation. 3. No discrete adenopathy. 4. Large dental caries and periapical lucencies in the residual left maxillary molar. 5. Stable scarring in the left upper lobe and postoperative changes. These results were called by telephone  at the time of interpretation on 01/07/2024 at 4:39 pm to provider [facility] , who verbally acknowledged these results. Electronically Signed   By: Marin Roberts M.D.   On: 01/07/2024 16:50

## 2024-01-10 NOTE — Progress Notes (Signed)
 I attempted to meet with Christian Simmons and assess for spiritual and emotional needs.  He was asleep at time of visit.  I met his sister in the waiting room and inquired about the request for a Muslim cleric.  She was able to reach out to the [facility] of [place] and community members will come this afternoon to provide prayer and support.  Chaplains will continue to follow for ongoing support, but please also page or consult [place] if needs arise.

## 2024-01-10 NOTE — Progress Notes (Signed)
 Our team was contacted about the patient's current clinical situation. 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 Michell Heinrich in 2015 and was treated to a lung lesion with radiotherapy but told he could not have additional local therapy to the neck.   He has been on several courses of systemic therapy most recently including taxol/carboplatin with last administration on 01/04/24. He is currently hospitalized due to progressive neck pain. CT imaging showed progressive soft tissue disease in the posteiror right lateral neck involving the epidural space. He was started on Dexamethasone and has been struggling with side effects of anxiety.  An MRI of the c spine shows concerns for abnormal contrast-enhancement within the cervical spinal cord between C2-3 measuring up to 1.5 cm with abnormal T2 weighted signal in the cervical spinal cord extending from C2-C5 concerning for metastatic disease and/or direct invasion of the foraminal/epidural process, ventral and dorsal epidural contrast-enhancement at C1-4 levels was concerning for epidural spread of disease in the soft tissue in the right neck likely extends through C3 neural foramen affecting the space.  Mass effect within the right cervical tissues at the C3-4 level was also appreciated as seen on prior CT imaging.  His case was discussed in multidisciplinary brain and spine oncology conference this morning.  Unfortunately he is not a good candidate for local conformal radiation given his history of diffuse therapy including reirradiation.  We will reach out to Dr. Olga Coaster at [facility] in [place], one of the physicians overseeing proton therapy to see if the patient is a candidate for evaluation and/or possibly therapy through this modality of  radiation.     Osker Mason, PAC

## 2024-01-10 NOTE — Progress Notes (Signed)
 Daily Progress Note   Patient Name: Christian Simmons       Date: 01/10/2024 DOB: 05-09-1986  Age: 38 y.o. MRN#: [id number] Attending Physician: Glade Lloyd, MD Primary Care Physician: Patient, No Pcp Per Admit Date: 01/07/2024  Reason for Consultation/Follow-up: Establishing goals of care, Non pain symptom management, and Pain control  Subjective: Anxious, restless, states that his family is planning to take him to [place] for second opinion. He asks for continuation of PCA until discharge,refuses steroids, wants PO Ativan PRN on discharge.    Length of Stay: 3  Current Medications: Scheduled Meds:   acetaminophen  650 mg Oral Q4H   celecoxib  200 mg Oral BID   celecoxib  200 mg Oral BID   Chlorhexidine Gluconate Cloth  6 each Topical Daily   dexamethasone  6 mg Intravenous Q12H   feeding supplement  237 mL Oral BID BM   HYDROmorphone   Intravenous Q4H   methadone  10 mg Oral Q8H   nicotine  14 mg Transdermal Daily   polyethylene glycol  17 g Oral Daily   senna-docusate  2 tablet Oral BID   sodium chloride flush  10-40 mL Intracatheter Q12H   sodium chloride flush  3 mL Intravenous Q12H    Continuous Infusions:  magnesium sulfate bolus IVPB      PRN Meds: bisacodyl, diphenhydrAMINE **OR** diphenhydrAMINE, HYDROmorphone (DILAUDID) injection, LORazepam, naloxone **AND** sodium chloride flush, ondansetron (ZOFRAN) IV, oxyCODONE, sodium chloride flush  Physical Exam         Weak appearing Appears deconditioned, chronically ill Trace LE edema Restless and anxious appearing  Vital Signs: BP 105/67 (BP Location: Right Arm)   Pulse 92   Temp 97.8 F (36.6 C) (Oral)   Resp 16   Ht 5\' 6"  (1.676 m)   Wt 75.4 kg   SpO2 100%   BMI 26.83 kg/m  SpO2: SpO2: 100 % O2 Device:  O2 Device: Room Air O2 Flow Rate: O2 Flow Rate (L/min): 0 L/min  Intake/output summary:  Intake/Output Summary (Last 24 hours) at 01/10/2024 1047 Last data filed at 01/09/2024 2000 Gross per 24 hour  Intake 120 ml  Output --  Net 120 ml   LBM: Last BM Date : 01/08/24 Baseline Weight: Weight: 77.2 kg Most recent weight: Weight: 75.4 kg       Palliative  Assessment/Data:      Patient Active Problem List   Diagnosis Date Noted   Neck pain 01/07/2024   Drug-induced skin rash 05/21/2023   Anemia due to antineoplastic chemotherapy 03/19/2023   Hypomagnesemia 01/22/2023   Diarrhea 01/22/2023   Mucositis due to antineoplastic therapy 01/08/2023   Anterior dislocation of right shoulder 12/01/2021   Chronic limitation of movement of neck 10/26/2021   Anxiety, generalized 06/18/2020   Preventive measure 06/13/2020   Liver lesion 10/05/2019   Type 2 diabetes mellitus (HCC) 04/06/2019   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, 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   Vitamin B12 deficiency 03/02/2019   Pancytopenia, acquired (HCC) 11/11/2018   Goals of care, counseling/discussion 09/21/2018   Cancer associated pain 09/08/2018   Financial difficulties 11/26/2017   Medically noncompliant 04/13/2017   Port catheter in place 04/01/2016   Gastritis without bleeding 01/22/2016   Arrhythmia 12/25/2015   Infection of eyelash follicle of left eye 12/25/2015   Poor dentition 10/16/2015   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/08/2015   Other constipation 05/16/2015   Neck pain on right side 01/28/2015   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  This is concerning for metastatic disease and direct invasion of the epidural process.  Sees PMT at cancer center On PO Methadone Now placed on IV steroids, Ativan IV PRN and Dilaudid bolus only PCA.   Recommendations/Plan: Full Code, Full Scope care, okay to continue bolus only Dilaudid PCA until discharge, continue oral methadone. Ativan IV PRN here and then PO Ativan PRN on discharge.  Recommend spiritual care - Muslim chaplain if available. Continue current mode of care - family looking into taking the patient to [facility] [place] for second opinion.  Full code full scope   Goals of Care and Additional Recommendations: Limitations on Scope of Treatment: Full Scope Treatment  Code Status:    Code Status Orders  (From admission, onward)           Start     Ordered   01/07/24 1903  Full code  Continuous       Question:  By:  Answer:  Consent: discussion documented in EHR   01/07/24 1902           Code Status History     This patient has a current code status but no historical code status.       Prognosis:  Guarded.   Discharge Planning: To Be Determined  Care plan was discussed with  patient. Also discussed with IDT - oncology rad onc RN TRH MD.   Thank you for allowing the Palliative Medicine Team to assist in the care of this patient. Mod MDM.      Greater than 50%  of this time was spent counseling and coordinating care related to the above assessment and plan.  Rosalin Hawking, MD  Please contact Palliative Medicine Team phone at [phone] for questions and concerns.

## 2024-01-10 NOTE — Progress Notes (Signed)
 I spoke with Dr. Olga Coaster at [facility] proton facility and he would be willing to cosider proton therapy for the pt if he were stable as an outpt and able to wait 2-3 weeks after simulation to start the therapy and commute to [place] each day. He would not be a candidate without insurance. I let his team know this and we can help facilitate referral if desired after his visit witg Dr. Bertis Ruddy.

## 2024-01-10 NOTE — Progress Notes (Signed)
 SLP Cancellation Note  Patient Details Name: Christian Simmons MRN: [id number] DOB: March 30, 1986   Cancelled treatment:       Reason Eval/Treat Not Completed: Patient declined, no reason specified. Patient declined PO's and had eyes closed, lying in bed. He participated minimally in conversation with SLP, typically giving a thumbs up to respond to questions. Family members in room reported that he has been able to swallow "oat milk" and some soft solids but water is "too light" (thin) and he has had some coughing with that so he has been avoiding it. SLP to s/o at this time but please reorder if needed.   Angela Nevin, MA, CCC-SLP Speech Therapy

## 2024-01-11 ENCOUNTER — Encounter: Payer: Self-pay | Admitting: Hematology and Oncology

## 2024-01-11 ENCOUNTER — Other Ambulatory Visit (HOSPITAL_COMMUNITY): Payer: Self-pay

## 2024-01-11 ENCOUNTER — Telehealth: Payer: Self-pay

## 2024-01-11 DIAGNOSIS — Z79899 Other long term (current) drug therapy: Secondary | ICD-10-CM

## 2024-01-11 DIAGNOSIS — R4589 Other symptoms and signs involving emotional state: Secondary | ICD-10-CM

## 2024-01-11 DIAGNOSIS — Z515 Encounter for palliative care: Secondary | ICD-10-CM

## 2024-01-11 DIAGNOSIS — Z7189 Other specified counseling: Secondary | ICD-10-CM

## 2024-01-11 DIAGNOSIS — G893 Neoplasm related pain (acute) (chronic): Secondary | ICD-10-CM

## 2024-01-11 LAB — BASIC METABOLIC PANEL WITH GFR
Anion gap: 6 (ref 5–15)
BUN: 16 mg/dL (ref 6–20)
CO2: 27 mmol/L (ref 22–32)
Calcium: 8.9 mg/dL (ref 8.9–10.3)
Chloride: 98 mmol/L (ref 98–111)
Creatinine, Ser: 0.69 mg/dL (ref 0.61–1.24)
GFR, Estimated: >60 mL/min (ref >60)
Glucose, Bld: 133 mg/dL — ABNORMAL HIGH (ref 70–99)
Potassium: 3.7 mmol/L (ref 3.5–5.1)
Sodium: 131 mmol/L — ABNORMAL LOW (ref 135–145)

## 2024-01-11 LAB — MAGNESIUM: Magnesium: 1.7 mg/dL (ref 1.7–2.4)

## 2024-01-11 MED ORDER — CELECOXIB 200 MG PO CAPS
200.0000 mg | ORAL_CAPSULE | Freq: Two times a day (BID) | ORAL | 0 refills | Status: DC
  Filled 2024-01-11: qty 30, 15d supply, fill #0

## 2024-01-11 MED ORDER — OXYCODONE HCL 5 MG PO TABS: 20.0000 mg | ORAL_TABLET | ORAL | Status: DC | PRN

## 2024-01-11 MED ORDER — LORAZEPAM 0.5 MG PO TABS
0.5000 mg | ORAL_TABLET | Freq: Four times a day (QID) | ORAL | 0 refills | Status: DC | PRN
  Filled 2024-01-11: qty 20, 5d supply, fill #0

## 2024-01-11 MED ORDER — DEXAMETHASONE 2 MG PO TABS
6.0000 mg | ORAL_TABLET | Freq: Two times a day (BID) | ORAL | 0 refills | Status: DC
  Filled 2024-01-11: qty 30, 15d supply, fill #0
  Filled 2024-01-11: qty 90, 15d supply, fill #0

## 2024-01-11 MED ORDER — ALTEPLASE 2 MG IJ SOLR
2.0000 mg | Freq: Once | INTRAMUSCULAR | Status: AC
  Administered 2024-01-11: 2 mg
  Filled 2024-01-11: qty 2

## 2024-01-11 MED ORDER — OXYCODONE HCL 20 MG PO TABS
20.0000 mg | ORAL_TABLET | ORAL | 0 refills | Status: DC | PRN
  Filled 2024-01-11: qty 20, 4d supply, fill #0

## 2024-01-11 NOTE — Plan of Care (Signed)
  Problem: Elimination: Goal: Will not experience complications related to bowel motility Outcome: Progressing Goal: Will not experience complications related to urinary retention Outcome: Progressing   Problem: Pain Managment: Goal: General experience of comfort will improve and/or be controlled Outcome: Progressing   Problem: Safety: Goal: Ability to remain free from injury will improve Outcome: Progressing   Problem: Skin Integrity: Goal: Risk for impaired skin integrity will decrease Outcome: Progressing   Problem: Nutrition: Goal: Adequate nutrition will be maintained Outcome: Not Progressing   Problem: Coping: Goal: Level of anxiety will decrease Outcome: Not Progressing

## 2024-01-11 NOTE — TOC Initial Note (Addendum)
 Transition of Care [facility]) - Initial/Assessment Note    Patient Details  Name: Christian Simmons MRN: [id number] Date of Birth: September 20, 1986  Transition of [facility]) CM/SW Contact:    Beckie Busing, RN Phone Number:[phone]  01/11/2024, 10:20 AM  Clinical Narrative:                 TOC acknowledges request from RN to see patient. Cm at bedside wife is present. Wife concerned that family wants to take patient home on Tuesday  4/8 with plans to take patient to [facility] for second opinion. Wife states that she was told to see the case manager in order to obtain all of patients medical records. CM has explained that CM is not able to provide medical records and that would be a process in which family will need to present release of medical record forms to medical records. Info passed on to RN. TOC will continue to follow   Expected Discharge Plan: Home/Self Care Barriers to Discharge: Continued Medical Work up   Patient Goals and CMS Choice Patient states their goals for this hospitalization and ongoing recovery are:: Patient sleeping wife answers quetions   Choice offered to / list presented to : NA [place] ownership interest in [facility].provided to::  (n/a)    Expected Discharge Plan and Services In-house Referral: NA Discharge Planning Services: CM Consult Post Acute Care Choice: NA Living arrangements for the past 2 months: Single Family Home                 DME Arranged: N/A DME Agency: NA       HH Arranged: NA HH Agency: NA        Prior Living Arrangements/Services Living arrangements for the past 2 months: Single Family Home Lives with:: Spouse, Minor Children Patient language and need for interpreter reviewed:: Yes Do you feel safe going back to the place where you live?:  (patient sleeping wife answers questions)      Need for Family Participation in Patient Care: Yes (Comment) Care giver support system in place?: Yes (comment) Current home  services:  (n/a) Criminal Activity/Legal Involvement Pertinent to Current Situation/Hospitalization: No - Comment as needed  Activities of Daily Living   ADL Screening (condition at time of admission) Independently performs ADLs?: Yes (appropriate for developmental age) Is the patient deaf or have difficulty hearing?: No Does the patient have difficulty seeing, even when wearing glasses/contacts?: No Does the patient have difficulty concentrating, remembering, or making decisions?: No  Permission Sought/Granted Permission sought to share information with : Family Supports Permission granted to share information with : Yes, Verbal Permission Granted  Share Information with NAME: Bennetta Laos  [phone]     Permission granted to share info w Relationship: spouse  Permission granted to share info w Contact Information: [phone]  Emotional Assessment Appearance:: Appears stated age Attitude/Demeanor/Rapport: Unable to Assess Affect (typically observed): Restless Orientation: : Oriented to Self, Oriented to Place, Oriented to  Time, Oriented to Situation Alcohol / Substance Use: Not Applicable Psych Involvement: No (comment)  Admission diagnosis:  Neck pain [M54.2] Head and neck cancer (HCC) [C76.0] Patient Active Problem List   Diagnosis Date Noted   Neck pain 01/07/2024   Drug-induced skin rash 05/21/2023   Anemia due to antineoplastic chemotherapy 03/19/2023   Hypomagnesemia 01/22/2023   Diarrhea 01/22/2023   Mucositis due to antineoplastic therapy 01/08/2023   Anterior dislocation of right shoulder 12/01/2021   Chronic limitation of movement of neck 10/26/2021   Anxiety,  generalized 06/18/2020   Preventive measure 06/13/2020   Liver lesion 10/05/2019   Type 2 diabetes mellitus (HCC) 04/06/2019   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, 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   Vitamin B12 deficiency 03/02/2019   Pancytopenia, acquired (HCC) 11/11/2018   Goals of care,  counseling/discussion 09/21/2018   Cancer associated pain 09/08/2018   Financial difficulties 11/26/2017   Medically noncompliant 04/13/2017   Port catheter in place 04/01/2016   Gastritis without bleeding 01/22/2016   Arrhythmia 12/25/2015   Infection of eyelash follicle of left eye 12/25/2015   Poor dentition 10/16/2015   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/08/2015   Other constipation 05/16/2015   Neck pain on right side 01/28/2015   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/2014   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   Acquired hypothyroidism 06/09/2014   Malignant neoplasm of head and neck (HCC) 06/01/2014   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) 06/01/2014   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

## 2024-01-11 NOTE — Progress Notes (Signed)
 AVS given to pt and reviewed. PCA disconnected, PAC deaccessed and PIV removed, meds delivered.

## 2024-01-11 NOTE — TOC Transition Note (Signed)
 Transition of Care [facility]) - Discharge Note   Patient Details  Name: Christian Simmons MRN: [id number] Date of Birth: 05-Jun-1986  [facility] of Care [facility]) CM/SW Contact:  Beckie Busing, RN Phone Number:[phone]  01/11/2024, 11:49 AM   Clinical Narrative:    Patient with discharge orders. No TOC needs.    Final next level of care: Home/Self Care Barriers to Discharge: No Barriers Identified   Patient Goals and CMS Choice Patient states their goals for this hospitalization and ongoing recovery are:: Patient sleeping wife answers quetions   Choice offered to / list presented to : NA [place] ownership interest in [facility].provided to::  (n/a)    Discharge Placement                       Discharge Plan and Services Additional resources added to the After Visit Summary for   In-house Referral: NA Discharge Planning Services: CM Consult Post Acute Care Choice: NA          DME Arranged: N/A DME Agency: NA       HH Arranged: NA HH Agency: NA        Social Drivers of Health (SDOH) Interventions SDOH Screenings   Food Insecurity: No Food Insecurity (01/07/2024)  Housing: Low Risk  (01/07/2024)  Transportation Needs: No Transportation Needs (01/07/2024)  Utilities: Not At Risk (01/07/2024)  Tobacco Use: Low Risk  (01/07/2024)     Readmission Risk Interventions     No data to display

## 2024-01-11 NOTE — Telephone Encounter (Signed)
 Returned his call. He is leaving tomorrow traveling to [place] and needs medical summary of treatment. Printed Dr. Bertis Ruddy note from yesterday and placed in envelope. He will come by the office today to pick up once he is d/ced home.

## 2024-01-11 NOTE — Discharge Summary (Signed)
 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 On presentation, CT of neck showed progressive diffuse soft tissue mass in the posterior right lateral neck surrounding the spinal cord with recommendations of MRI of cervical spine. Patient received IV analgesics in the ED but continued to have pain. MRI of cervical spine spine showed findings concerning for metastatic disease or direct invasion of the foraminal/epidural process. Oncology/palliative care and radiation oncology has been consulted.  Patient was started on Dilaudid PCA pump by palliative care.  He is not a good candidate for local radiation therapy as per radiation oncology recommendations.  Patient wants to go to [facility] for second opinion.  His flight is tomorrow.  He and his family is requesting discharge.  Oncology and palliative care have cleared him for discharge.  Discharge patient home today.  Overall prognosis is guarded to poor.  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 -CT of neck showed progressive diffuse  soft tissue mass in the posterior right lateral neck surrounding the spinal cord with recommendations of MRI of cervical spine.  MRI of cervical spine showed findings concerning for metastatic disease or direct invasion of the foraminal/epidural process - Currently on Dilaudid PCA pump as per palliative care.  Continue Decadron.  Continue bowel regimen.  Oncology following.  - Radiation oncology evaluation appreciated: He is not a Good candidate for local radiation therapy.  Radiation oncology has reached out to radiation oncology at [facility] in [place]: Patient will have to follow-up with them as an outpatient to consider proton therapy.  Patient is interested in getting second opinion at [facility] as an outpatient. -His flight is tomorrow.  He and his family is requesting discharge.  Oncology and palliative care have cleared him for discharge.  Discharge patient home today.  Overall prognosis is guarded to poor. -On discharge, patient needs to continue methadone, gabapentin along with increased dose of oxycodone 20 mg every 4 hours as needed along with as needed tizanidine.  Continue Effexor XR.   Anemia of chronic disease - From cancer.  Hemoglobin currently stable.  No signs of bleeding.  Monitor intermittently.   Hyponatremia - Encourage oral intake   Hypokalemia - Improved   Hypomagnesemia - Improved   Anxiety - Patient was on Ativan 0.5 mg twice a day at home.  Complained of worsening anxiety: Currently on Ativan 0.5 mg every 6 hours as needed IV.  Discharged on Ativan 0.5 mg orally every 6 hours as needed.   Discharge Instructions  Discharge Instructions     Diet general   Complete by: As directed    Increase activity slowly   Complete by: As directed       Allergies as of  01/11/2024       Reactions   Phenergan [promethazine Hcl] Anxiety   Causes patients HR to go into the 140's with severe anxiety   Heparin Other (See Comments)   No Pork derivatives due to  religion   Pork-derived Products Other (See Comments)   Religious reasons   Clindamycin Rash        Medication List     STOP taking these medications    metFORMIN 500 MG tablet Commonly known as: GLUCOPHAGE       TAKE these medications    bisacodyl 10 MG suppository Commonly known as: DULCOLAX Place 1 suppository (10 mg total) rectally as needed for moderate constipation.   blood glucose meter kit and supplies Kit Dispense based on patient and insurance preference. Use up to four times daily as directed. (FOR ICD-9 250.00, 250.01).   celecoxib 200 MG capsule Commonly known as: CELEBREX Take 1 capsule (200 mg total) by mouth 2 (two) times daily.   cetirizine 10 MG tablet Commonly known as: ZYRTEC Take 1 tablet (10 mg total) by mouth daily as needed for allergies.   dexamethasone 6 MG tablet Commonly known as: DECADRON Take 1 tablet (6 mg total) by mouth 2 (two) times daily with a meal. What changed:  medication strength how much to take how to take this when to take this additional instructions   famotidine 40 MG tablet Commonly known as: PEPCID Take 1 tablet (40 mg total) by mouth at bedtime.   gabapentin 100 MG capsule Commonly known as: Neurontin Take 2 capsules (200 mg total) by mouth 2 (two) times daily.   levothyroxine 175 MCG tablet Commonly known as: SYNTHROID Take 1 tablet (175 mcg total) by mouth daily before breakfast.   LORazepam 0.5 MG tablet Commonly known as: ATIVAN Take 1 tablet (0.5 mg total) by mouth every 6 (six) hours as needed for anxiety. What changed: when to take this   methadone 10 MG tablet Commonly known as: DOLOPHINE Take 1 tablet (10 mg) by mouth every 8 hours.   ondansetron 8 MG tablet Commonly known as: ZOFRAN Take 1 tablet (8 mg total) by mouth every 8 (eight) hours as needed for nausea or vomiting.   Oxycodone HCl 20 MG Tabs Take 1 tablet (20 mg total) by mouth every 4 (four) hours as needed for severe pain (pain  score 7-10). What changed:  medication strength how much to take reasons to take this   pantoprazole 40 MG tablet Commonly known as: Protonix Take 1 tablet (40 mg total) by mouth daily.   PEG 3350 17 g Pack Take 17 g by mouth 2 (two) times daily.   prochlorperazine 10 MG tablet Commonly known as: COMPAZINE Take 1 tablet (10 mg total) by mouth every 6 (six) hours as needed for nausea or vomiting.   Stool Softener/Laxative 50-8.6 MG tablet Generic drug: senna-docusate Take 2 tablets by mouth 2 (two) times daily.   tiZANidine 2 MG tablet Commonly known as: ZANAFLEX Take 1 tablet (2 mg total) by mouth every 6 (six) hours as needed for muscle spasms.   venlafaxine XR 37.5 MG 24 hr capsule Commonly known as: EFFEXOR-XR Take 1 capsule (37.5 mg total) by mouth daily with breakfast.        Follow-up Information     Artis Delay, MD. Schedule an appointment as soon as possible for a visit in 1 week(s).   Specialty: Hematology and Oncology Contact information: [address] [place] [address] [phone]  PCP. Schedule an appointment as soon as possible for a visit in 1 week(s).                 Allergies  Allergen Reactions   Phenergan [Promethazine Hcl] Anxiety    Causes patients HR to go into the 140's with severe anxiety   Heparin Other (See Comments)    No Pork derivatives due to religion   Pork-Derived Products Other (See Comments)    Religious reasons   Clindamycin Rash    Consultations: Oncology/radiation oncology/palliative care   Procedures/Studies: MR Cervical Spine W and Wo Contrast Result Date: 01/07/2024 CLINICAL DATA:  Acute neck pain.  Known malignancy. EXAM: MRI CERVICAL SPINE WITHOUT AND WITH CONTRAST TECHNIQUE: Multiplanar and multiecho pulse sequences of the cervical spine, to include the craniocervical junction and cervicothoracic junction, were obtained without and with intravenous contrast. CONTRAST:  7mL  GADAVIST GADOBUTROL 1 MMOL/ML IV SOLN COMPARISON:  CT neck 01/07/2024 FINDINGS: Alignment: Physiologic. Vertebrae: No fracture, evidence of discitis, or bone lesion. Cord: There is abnormal hyperintense T2-weighted signal within the cervical spinal cord extending from C2-C5. There is intramedullary abnormal contrast enhancement within the spinal cord at the C2-3 level, measuring 0.8 x 0.7 x 1.5 cm. There is ventral and dorsal epidural contrast enhancement at the C1-4 levels. Posterior Fossa, vertebral arteries, paraspinal tissues: Mass within the right cervical soft tissues at the C3-4 level is better appreciated on the earlier CT. This mass likely extends through the right C3 neural foramen. Disc levels: No disc herniation, spinal canal stenosis or neural foraminal stenosis. The thecal sac is effaced at the levels of the above described epidural contrast enhancement. IMPRESSION: 1. Intramedullary abnormal contrast enhancement within the cervical spinal cord at the C2-3 level, measuring 0.8 x 0.7 x 1.5 cm, with abnormal hyperintense T2-weighted signal within the cervical spinal cord extending from C2-C5. Given history of head and neck carcinoma, this is concerning for metastatic disease or direct invasion of the foraminal/epidural process described below. 2. Ventral and dorsal epidural contrast enhancement at the C1-4 levels, concerning for epidural spread of disease. The soft tissue lis in of the right neck likely extends through the right C3 neural foramen to affect the epidural space. 3. Mass within the right cervical soft tissues at the C3-4 level is better appreciated on the earlier CT. Electronically Signed   By: Deatra Robinson M.D.   On: 01/07/2024 21:28   CT Soft Tissue Neck W Contrast Result Date: 01/07/2024 CLINICAL DATA:  Non pulsatile neck mass. Personal history of head and neck cancer. Increasing pain and swelling. EXAM: CT NECK WITH CONTRAST TECHNIQUE: Multidetector CT imaging of the neck was  performed using the standard protocol following the bolus administration of intravenous contrast. RADIATION DOSE REDUCTION: This exam was performed according to the departmental dose-optimization program which includes automated exposure control, adjustment of the mA and/or kV according to patient size and/or use of iterative reconstruction technique. CONTRAST:  75mL OMNIPAQUE IOHEXOL 300 MG/ML  SOLN COMPARISON:  CT neck with contrast 10/04/2023 and 04/13/2023. FINDINGS: Pharynx and larynx: Nasopharynx is clear. Soft palate and tongue base are within normal limits. The oropharynx is clear. Vallecula and epiglottis are within normal limits. Aryepiglottic folds and piriform sinuses are clear. Vocal cords are midline and symmetric. Trachea is clear. Salivary glands: The submandibular glands are not discretely visualized. Parotid glands are within normal limits bilaterally. Thyroid: Atrophic Lymph nodes: No discrete adenopathy is present. 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 Minimal atherosclerotic changes are present at  the carotid bifurcations without significant stenosis. Limited intracranial: Within normal limits. Visualized orbits: The globes and orbits are within normal limits. Mastoids and visualized paranasal sinuses: The paranasal sinuses and mastoid air cells are clear. Skeleton: Vertebral body heights and alignment are normal. No focal osseous lesions are present. Large dental caries and periapical lucencies are noted in the residual left maxillary molar. Upper chest: Scarring in the left upper lobe and postoperative changes are stable. Other: A progressive diffuse soft tissue mass present in the posterior right lateral neck. This extends to the right C2-3 and C3-4 neural foramina. Irregular soft tissue surrounds the spinal cord at both levels. A more discrete mass is present a posterior right spinal canal or possibly within the spinal cord at C2-3. This demonstrates scratched at this is similar to the  December study but has progressed since the July study. IMPRESSION: 1. Progressive diffuse soft tissue mass in the posterior right lateral neck. This extends to the right C2-3 and C3-4 neural foramina. Irregular soft tissue surrounds the spinal cord at both levels. 2. A more discrete mass is present a posterior right spinal canal or possibly within the spinal cord at C2-3. This is similar to the December study but has progressed since the July study. MRI of the cervical spine without and with contrast would be useful for further evaluation. 3. No discrete adenopathy. 4. Large dental caries and periapical lucencies in the residual left maxillary molar. 5. Stable scarring in the left upper lobe and postoperative changes. These results were called by telephone at the time of interpretation on 01/07/2024 at 4:39 pm to provider [facility] , who verbally acknowledged these results. Electronically Signed   By: Marin Roberts M.D.   On: 01/07/2024 16:50      Subjective: Patient seen and examined at bedside.  Wants to be discharged today.  Wife states that his flight is tomorrow to go to [place] and patient will go to [facility] from there.  Still complains of neck pain and anxiety but medications are helping.  No fever or vomiting reported.  Discharge Exam: Vitals:   01/11/24 0355 01/11/24 0753  BP:    Pulse:    Resp: 14 15  Temp:    SpO2: 100%     General: Pt is alert, awake, not in acute distress.  Looks chronically ill and deconditioned.  Flat affect. Cardiovascular: rate controlled, S1/S2 + Respiratory: bilateral decreased breath sounds at bases Abdominal: Soft, NT, ND, bowel sounds + Extremities: Trace lower extremity edema; no cyanosis    The results of significant diagnostics from this hospitalization (including imaging, microbiology, ancillary and laboratory) are listed below for reference.     Microbiology: Recent Results (from the past 240 hours)  Group A Strep by PCR      Status: None   Collection Time: 01/06/24  3:51 PM   Specimen: Throat; Sterile Swab  Result Value Ref Range Status   Group A Strep by PCR NOT DETECTED NOT DETECTED Final    Comment: Performed at [facility] [place], [address] [place], [place] [address]  Resp panel by RT-PCR (RSV, Flu A&B, Covid) Anterior Nasal Swab     Status: None   Collection Time: 01/06/24  3:51 PM   Specimen: Anterior Nasal Swab  Result Value Ref Range Status   SARS Coronavirus 2 by RT PCR NEGATIVE NEGATIVE Final    Comment: (NOTE) SARS-CoV-2 target nucleic acids are NOT DETECTED.  The SARS-CoV-2 RNA is generally detectable in upper respiratory  specimens during the acute phase of infection. The lowest concentration of SARS-CoV-2 viral copies this assay can detect is 138 copies/mL. A negative result does not preclude SARS-Cov-2 infection and should not be used as the sole basis for treatment or other patient management decisions. A negative result may occur with  improper specimen collection/handling, submission of specimen other than nasopharyngeal swab, presence of viral mutation(s) within the areas targeted by this assay, and inadequate number of viral copies(<138 copies/mL). A negative result must be combined with clinical observations, patient history, and epidemiological information. The expected result is Negative.  Fact Sheet for Patients:  BloggerCourse.com  Fact Sheet for Healthcare Providers:  SeriousBroker.it  This test is no t yet approved or cleared by the [place] FDA and  has been authorized for detection and/or diagnosis of SARS-CoV-2 by FDA under an Emergency Use Authorization (EUA). This EUA will remain  in effect (meaning this test can be used) for the duration of the COVID-19 declaration under Section 564(b)(1) of the Act, 21 U.S.C.section 360bbb-3(b)(1), unless the authorization is terminated  or revoked sooner.        Influenza A by PCR NEGATIVE NEGATIVE Final   Influenza B by PCR NEGATIVE NEGATIVE Final    Comment: (NOTE) The Xpert Xpress SARS-CoV-2/FLU/RSV plus assay is intended as an aid in the diagnosis of influenza from Nasopharyngeal swab specimens and should not be used as a sole basis for treatment. Nasal washings and aspirates are unacceptable for Xpert Xpress SARS-CoV-2/FLU/RSV testing.  Fact Sheet for Patients: BloggerCourse.com  Fact Sheet for Healthcare Providers: SeriousBroker.it  This test is not yet approved or cleared by the [place] FDA and has been authorized for detection and/or diagnosis of SARS-CoV-2 by FDA under an Emergency Use Authorization (EUA). This EUA will remain in effect (meaning this test can be used) for the duration of the COVID-19 declaration under Section 564(b)(1) of the Act, 21 U.S.C. section 360bbb-3(b)(1), unless the authorization is terminated or revoked.     Resp Syncytial Virus by PCR NEGATIVE NEGATIVE Final    Comment: (NOTE) Fact Sheet for Patients: BloggerCourse.com  Fact Sheet for Healthcare Providers: SeriousBroker.it  This test is not yet approved or cleared by the [place] FDA and has been authorized for detection and/or diagnosis of SARS-CoV-2 by FDA under an Emergency Use Authorization (EUA). This EUA will remain in effect (meaning this test can be used) for the duration of the COVID-19 declaration under Section 564(b)(1) of the Act, 21 U.S.C. section 360bbb-3(b)(1), unless the authorization is terminated or revoked.  Performed at [facility], [address] [place], [place] [address]      Labs: BNP (last 3 results) No results for input(s): "BNP" in the last 8760 hours. Basic Metabolic Panel: Recent Labs  Lab 01/07/24 1319 01/08/24 0532 01/09/24 0500 01/10/24 0550 01/11/24 0555  NA 134* 134* 130* 132*  131*  K 3.3* 3.4* 3.5 3.7 3.7  CL 99 98 97* 99 98  CO2 26 26 24 23 27   GLUCOSE 102* 97 139* 216* 133*  BUN 22* 19 17 20 16   CREATININE 0.73 0.78 0.78 0.74 0.69  CALCIUM 9.3 8.9 9.2 9.0 8.9  MG  --   --  1.6* 1.5* 1.7   Liver Function Tests: Recent Labs  Lab 01/06/24 1550 01/07/24 1319  AST 27 28  ALT 17 18  ALKPHOS 47 44  BILITOT 0.6 0.6  PROT 7.0 6.9  ALBUMIN 4.0 4.0   No results for input(s): "LIPASE", "AMYLASE" in  the last 168 hours. No results for input(s): "AMMONIA" in the last 168 hours. CBC: Recent Labs  Lab 01/06/24 1550 01/07/24 1319 01/08/24 0532  WBC 5.8 7.5 5.1  NEUTROABS 4.8 6.0  --   HGB 11.1* 11.1* 11.1*  HCT 33.0* 32.9* 34.0*  MCV 85.3 85.7 86.7  PLT 207 192 167   Cardiac Enzymes: No results for input(s): "CKTOTAL", "CKMB", "CKMBINDEX", "TROPONINI" in the last 168 hours. BNP: Invalid input(s): "POCBNP" CBG: No results for input(s): "GLUCAP" in the last 168 hours. D-Dimer No results for input(s): "DDIMER" in the last 72 hours. Hgb A1c No results for input(s): "HGBA1C" in the last 72 hours. Lipid Profile No results for input(s): "CHOL", "HDL", "LDLCALC", "TRIG", "CHOLHDL", "LDLDIRECT" in the last 72 hours. Thyroid function studies No results for input(s): "TSH", "T4TOTAL", "T3FREE", "THYROIDAB" in the last 72 hours.  Invalid input(s): "FREET3" Anemia work up No results for input(s): "VITAMINB12", "FOLATE", "FERRITIN", "TIBC", "IRON", "RETICCTPCT" in the last 72 hours. Urinalysis    Component Value Date/Time   COLORURINE STRAW (A) 10/10/2018 1620   APPEARANCEUR CLEAR 10/10/2018 1620   LABSPEC 1.012 10/10/2018 1620   PHURINE 6.0 10/10/2018 1620   GLUCOSEU 50 (A) 10/10/2018 1620   HGBUR SMALL (A) 10/10/2018 1620   BILIRUBINUR NEGATIVE 10/10/2018 1620   KETONESUR NEGATIVE 10/10/2018 1620   PROTEINUR NEGATIVE 10/10/2018 1620   NITRITE NEGATIVE 10/10/2018 1620   LEUKOCYTESUR NEGATIVE 10/10/2018 1620   Sepsis Labs Recent Labs  Lab  01/06/24 1550 01/07/24 1319 01/08/24 0532  WBC 5.8 7.5 5.1   Microbiology Recent Results (from the past 240 hours)  Group A Strep by PCR     Status: None   Collection Time: 01/06/24  3:51 PM   Specimen: Throat; Sterile Swab  Result Value Ref Range Status   Group A Strep by PCR NOT DETECTED NOT DETECTED Final    Comment: Performed at [facility], [address] [place], [place] [address]  Resp panel by RT-PCR (RSV, Flu A&B, Covid) Anterior Nasal Swab     Status: None   Collection Time: 01/06/24  3:51 PM   Specimen: Anterior Nasal Swab  Result Value Ref Range Status   SARS Coronavirus 2 by RT PCR NEGATIVE NEGATIVE Final    Comment: (NOTE) SARS-CoV-2 target nucleic acids are NOT DETECTED.  The SARS-CoV-2 RNA is generally detectable in upper respiratory specimens during the acute phase of infection. The lowest concentration of SARS-CoV-2 viral copies this assay can detect is 138 copies/mL. A negative result does not preclude SARS-Cov-2 infection and should not be used as the sole basis for treatment or other patient management decisions. A negative result may occur with  improper specimen collection/handling, submission of specimen other than nasopharyngeal swab, presence of viral mutation(s) within the areas targeted by this assay, and inadequate number of viral copies(<138 copies/mL). A negative result must be combined with clinical observations, patient history, and epidemiological information. The expected result is Negative.  Fact Sheet for Patients:  BloggerCourse.com  Fact Sheet for Healthcare Providers:  SeriousBroker.it  This test is no t yet approved or cleared by the [place] FDA and  has been authorized for detection and/or diagnosis of SARS-CoV-2 by FDA under an Emergency Use Authorization (EUA). This EUA will remain  in effect (meaning this test can be used) for the duration of the COVID-19  declaration under Section 564(b)(1) of the Act, 21 U.S.C.section 360bbb-3(b)(1), unless the authorization is terminated  or revoked sooner.       Influenza  A by PCR NEGATIVE NEGATIVE Final   Influenza B by PCR NEGATIVE NEGATIVE Final    Comment: (NOTE) The Xpert Xpress SARS-CoV-2/FLU/RSV plus assay is intended as an aid in the diagnosis of influenza from Nasopharyngeal swab specimens and should not be used as a sole basis for treatment. Nasal washings and aspirates are unacceptable for Xpert Xpress SARS-CoV-2/FLU/RSV testing.  Fact Sheet for Patients: BloggerCourse.com  Fact Sheet for Healthcare Providers: SeriousBroker.it  This test is not yet approved or cleared by the [place] FDA and has been authorized for detection and/or diagnosis of SARS-CoV-2 by FDA under an Emergency Use Authorization (EUA). This EUA will remain in effect (meaning this test can be used) for the duration of the COVID-19 declaration under Section 564(b)(1) of the Act, 21 U.S.C. section 360bbb-3(b)(1), unless the authorization is terminated or revoked.     Resp Syncytial Virus by PCR NEGATIVE NEGATIVE Final    Comment: (NOTE) Fact Sheet for Patients: BloggerCourse.com  Fact Sheet for Healthcare Providers: SeriousBroker.it  This test is not yet approved or cleared by the [place] FDA and has been authorized for detection and/or diagnosis of SARS-CoV-2 by FDA under an Emergency Use Authorization (EUA). This EUA will remain in effect (meaning this test can be used) for the duration of the COVID-19 declaration under Section 564(b)(1) of the Act, 21 U.S.C. section 360bbb-3(b)(1), unless the authorization is terminated or revoked.  Performed at [facility], [address] [place], [place] [address]      Time coordinating discharge: 35 minutes  SIGNED:   Glade Lloyd,  MD  [facility] 01/11/2024, 11:25 AM

## 2024-01-11 NOTE — Plan of Care (Signed)
 Patient and wife at the bedside. AVS went over with patient and they are requesting medical records. I notify the family about medical records and Primary RN gave the number, fax, and email to the family. No further needs atm.  Problem: Education: Goal: Knowledge of General Education information will improve Description: Including pain rating scale, medication(s)/side effects and non-pharmacologic comfort measures Outcome: Adequate for Discharge   Problem: Health Behavior/Discharge Planning: Goal: Ability to manage health-related needs will improve Outcome: Adequate for Discharge   Problem: Clinical Measurements: Goal: Ability to maintain clinical measurements within normal limits will improve Outcome: Adequate for Discharge Goal: Will remain free from infection Outcome: Adequate for Discharge Goal: Diagnostic test results will improve Outcome: Adequate for Discharge Goal: Respiratory complications will improve Outcome: Adequate for Discharge Goal: Cardiovascular complication will be avoided Outcome: Adequate for Discharge   Problem: Activity: Goal: Risk for activity intolerance will decrease Outcome: Adequate for Discharge   Problem: Nutrition: Goal: Adequate nutrition will be maintained Outcome: Adequate for Discharge   Problem: Coping: Goal: Level of anxiety will decrease Outcome: Adequate for Discharge   Problem: Elimination: Goal: Will not experience complications related to bowel motility Outcome: Adequate for Discharge Goal: Will not experience complications related to urinary retention Outcome: Adequate for Discharge   Problem: Pain Managment: Goal: General experience of comfort will improve and/or be controlled Outcome: Adequate for Discharge   Problem: Safety: Goal: Ability to remain free from injury will improve Outcome: Adequate for Discharge   Problem: Skin Integrity: Goal: Risk for impaired skin integrity will decrease Outcome: Adequate for Discharge

## 2024-01-11 NOTE — Progress Notes (Signed)
 Daily Progress Note   Patient Name: Christian Simmons       Date: 01/11/2024 DOB: [birth year]  Age: 38 y.o. MRN#: [id number] Attending Physician: Glade Lloyd, MD Primary Care Physician: Patient, No Pcp Per Admit Date: 01/07/2024 Length of Stay: 4 days  Reason for Consultation/Follow-up: Establishing goals of care and Pain control  Subjective:   CC: Patient wanting to get discharged today for second opinion at [facility].  Following up regarding complex medical decision making and symptom management.  Subjective:  Reviewed EMR prior to presenting to bedside.  At time of EMR review in past 24 hours patient has required IV Dilaudid via PCA with bolus dosing of 0.3 mg x 32 doses and oxycodone 10 mg x 2 doses.  Patient remains on long-acting methadone 10 mg Q8.  Patient has also been receiving Celebrex 200 mg twice daily and IV dexamethasone 6 mg every 12 hours. Discussed care with hospitalist and oncologist this morning.  Patient requesting discharge today so can get on flight tomorrow to go to [facility]  Presented to bedside to see patient.  Patient's wife present at bedside.  Introduced myself as a member of the palliative medicine team.  Spent time discussing patient's symptoms at this time.  Discussed based on patient's OME requirements, could technically increase methadone dosing.  With patient wanting to get discharged today, do not want to increase dosing without being able to monitor especially if patient traveling out of state.  Discussed can adjust short acting medication for patient's trip.  Patient feels that the oxycodone 10 mg dose only minimally helps his pain.  Patient denies lethargy when taking the 10 mg of oxycodone.  Discussed can increase patient's oxycodone dose to allow for oxycodone 20 mg every 4 hours as needed.  Also discussed use of as needed Ativan to assist with anxiety management.  Noted with increasing of opioids, would continue current dose of benzodiazepine.   Continuing home methadone dose of 10 mg every 8 hours as well.  Spent time answering questions as able and providing emotional support via active listening.  All questions answered at that time.  Informed hospitalist to noted plan for discharge today with symptom management medications.  Objective:   Vital Signs:  BP 129/70 (BP Location: Right Arm)   Pulse 73   Temp 98.3 F (36.8 C) (Oral)   Resp 15   Ht 5\' 6"  (1.676 m)   Wt 75.4 kg   SpO2 100%   BMI 26.83 kg/m   Physical Exam: General: NAD, alert, chronically ill-appearing, cachectic, frail Cardiovascular: RRR Respiratory: no increased work of breathing noted, not in respiratory distress Neuro: A&Ox4, following commands easily Psych: appropriately answers all questions  Imaging:  I personally reviewed recent imaging.   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  CT imaging showing progressive diffuse soft tissue masses in the posterior right lateral neck surrounding the spinal cord.  MRI imaging also obtained and showed signs concerning for metastatic disease or direct invasion of the foraminal/epidural process.  Oncology consulted and recommended hospice.  Palliative medicine team consulted regarding complex medical decision making and symptom management.  Recommendations/Plan: # Complex medical decision making/goals of care:  - Patient discussed care with oncologist during hospitalization.  Due to progression of cancer, hospice was recommended.  Patient and  family are not in support of hospice directed care at this time.  Patient and family have opted for patient to travel to [facility] for second opinion.  -  Code Status: Full Code  # Symptom management: Patient is receiving these palliative  interventions for symptom management with an intent to improve quality of life.   - Pain, in setting of metastatic head and neck cancer   - Continue oral methadone 10 mg every 8 hours   - Change oral oxycodone to 20 mg every 4 hours as needed   - Discontinue IV Dilaudid PCA with discharge   - Continue bowel regimen while receiving opioids   - Anxiety   - Continue oral Ativan 0.5 mg every 4 hours as needed  # Discharge Planning:  Home .  Patient and family plan to travel to [facility] tomorrow for second opinion.  Discussed with: Hospitalist, RN, oncologist, patient, patient's wife  Thank you for allowing the palliative care team to participate in the care [facility]  Alvester Morin, DO Palliative Care Provider PMT # [phone]  If patient remains symptomatic despite maximum doses, please call PMT at [phone] between 0700 and 1900. Outside of these hours, please call attending, as PMT does not have night coverage.

## 2024-01-14 ENCOUNTER — Encounter: Payer: Self-pay | Admitting: Radiation Oncology

## 2024-01-14 NOTE — Progress Notes (Signed)
 Noted that pt is planning care at [facility]. Please notify our team if additional assistance to get to [facility] for a proton radiation consultation is desired.

## 2024-01-24 ENCOUNTER — Inpatient Hospital Stay: Payer: Self-pay | Admitting: Nurse Practitioner

## 2024-01-24 ENCOUNTER — Encounter: Payer: Self-pay | Admitting: Hematology and Oncology

## 2024-01-26 ENCOUNTER — Telehealth: Payer: Self-pay | Admitting: Nurse Practitioner

## 2024-01-26 NOTE — Telephone Encounter (Signed)
 Patient called in to rescheduled for this Friday. I advised Christian Simmons that we will call him back today as I am awaiting a response.

## 2024-01-26 NOTE — Telephone Encounter (Signed)
 I re-scheduled patient's appts. Advised patient to contact us  if rescheduling is needed.  I provided my direct line.

## 2024-01-28 ENCOUNTER — Ambulatory Visit: Admitting: Hematology and Oncology

## 2024-01-28 ENCOUNTER — Ambulatory Visit

## 2024-01-28 ENCOUNTER — Other Ambulatory Visit (HOSPITAL_BASED_OUTPATIENT_CLINIC_OR_DEPARTMENT_OTHER): Payer: Self-pay

## 2024-01-28 ENCOUNTER — Encounter

## 2024-01-28 ENCOUNTER — Other Ambulatory Visit (HOSPITAL_COMMUNITY): Payer: Self-pay

## 2024-01-28 ENCOUNTER — Other Ambulatory Visit

## 2024-01-28 ENCOUNTER — Encounter: Payer: Self-pay | Admitting: Hematology and Oncology

## 2024-01-31 ENCOUNTER — Other Ambulatory Visit (HOSPITAL_BASED_OUTPATIENT_CLINIC_OR_DEPARTMENT_OTHER): Payer: Self-pay

## 2024-01-31 ENCOUNTER — Telehealth: Payer: Self-pay | Admitting: Licensed Clinical Social Worker

## 2024-01-31 NOTE — Telephone Encounter (Signed)
 [facility] Clinical Social Work  CSW attempted to contact pt by phone to follow-up on resources as pt is now back in [place] and came to the cancer center on Friday but CSW was unavailable.  No answer. Left VM with direct contact information.   Zeke Aker E Yamilee Harmes, LCSW

## 2024-02-01 ENCOUNTER — Inpatient Hospital Stay (HOSPITAL_BASED_OUTPATIENT_CLINIC_OR_DEPARTMENT_OTHER): Payer: Self-pay | Admitting: Nurse Practitioner

## 2024-02-01 ENCOUNTER — Encounter: Payer: Self-pay | Admitting: Nurse Practitioner

## 2024-02-01 VITALS — BP 131/85 | HR 70 | Temp 97.9°F | Resp 17 | Wt 174.8 lb

## 2024-02-01 DIAGNOSIS — G893 Neoplasm related pain (acute) (chronic): Secondary | ICD-10-CM

## 2024-02-01 DIAGNOSIS — F419 Anxiety disorder, unspecified: Secondary | ICD-10-CM

## 2024-02-01 DIAGNOSIS — Z7189 Other specified counseling: Secondary | ICD-10-CM

## 2024-02-01 DIAGNOSIS — R530 Neoplastic (malignant) related fatigue: Secondary | ICD-10-CM

## 2024-02-01 DIAGNOSIS — C7800 Secondary malignant neoplasm of unspecified lung: Secondary | ICD-10-CM

## 2024-02-01 DIAGNOSIS — G4700 Insomnia, unspecified: Secondary | ICD-10-CM

## 2024-02-01 DIAGNOSIS — Z515 Encounter for palliative care: Secondary | ICD-10-CM

## 2024-02-01 NOTE — Progress Notes (Signed)
 Palliative Medicine [facility]  [phone] [phone]   Name: Christian Simmons Date: 02/01/2024 MRN: [id number]  DOB: 1985-11-05  Patient Care Team: Patient, No Pcp Per as PCP - General (General Practice) Almeda Jacobs, MD as Consulting Physician (Hematology and Oncology) Rand Burrs, MD as Referring Physician (Plastic Surgery) Christian Forts, MD as Consulting Physician (Gastroenterology) Pickenpack-Cousar, Christian Labrum, NP as Nurse Practitioner (Hospice and Palliative Medicine)    INTERVAL HISTORY: Christian Simmons is a 38 y.o. 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  Palliative is seeing patient for symptom management and goals of care.   SOCIAL HISTORY:     reports that he has never smoked. He has never used smokeless tobacco. He reports that he does not drink alcohol and does not use drugs.  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  MEDICATIONS:  Current Outpatient Medications  Medication Sig Dispense Refill   bisacodyl  (DULCOLAX) 10 MG suppository Place 1 suppository (10 mg total) rectally as needed for moderate constipation. 4 suppository 0   blood glucose meter kit and supplies KIT Dispense based on patient  and insurance preference. Use up to four times daily as directed. (FOR ICD-9 250.00, 250.01). 1 each 1   celecoxib  (CELEBREX ) 200 MG capsule Take 1 capsule (200 mg total) by mouth 2 (two) times daily. 30 capsule 0   cetirizine  (ZYRTEC ) 10 MG tablet Take 1 tablet (10 mg total) by mouth daily as needed for allergies. 100 tablet 0   dexamethasone  (DECADRON ) 2 MG tablet Take 3 tablets (6 mg total) by mouth 2 (two) times daily with a meal. 180 tablet 0   famotidine  (PEPCID ) 40 MG tablet Take 1 tablet (40 mg total) by mouth at bedtime. 30 tablet 3   gabapentin  (NEURONTIN ) 100 MG capsule Take 2 capsules (200 mg total) by mouth 2 (two) times daily. 120 capsule 3   levothyroxine  (SYNTHROID ) 175 MCG tablet Take 1 tablet (175 mcg total) by mouth daily before breakfast. 30 tablet 3   LORazepam  (ATIVAN ) 0.5 MG tablet Take 1 tablet (0.5 mg total) by mouth every 6 (six) hours as needed for anxiety. 20 tablet 0   methadone  (DOLOPHINE ) 10 MG tablet Take 1 tablet (10 mg) by mouth every 8 hours. 90 tablet 0   ondansetron  (ZOFRAN ) 8 MG tablet Take 1 tablet (8 mg total) by mouth every 8 (eight) hours as needed for nausea or vomiting. 90 tablet 1   Oxycodone  HCl 20 MG TABS Take 1 tablet (20 mg total) by mouth every 4 (four) hours as needed for severe pain (pain score 7-10). 20 tablet 0   pantoprazole  (PROTONIX ) 40 MG tablet Take 1 tablet (40 mg total) by mouth daily. 30 tablet 2  polyethylene glycol (MIRALAX  / GLYCOLAX ) 17 g packet Take 17 g by mouth 2 (two) times daily. 30 each 3   prochlorperazine  (COMPAZINE ) 10 MG tablet Take 1 tablet (10 mg total) by mouth every 6 (six) hours as needed for nausea or vomiting. 30 tablet 2   senna-docusate (SENOKOT-S) 8.6-50 MG tablet Take 2 tablets by mouth 2 (two) times daily. 100 tablet 3   tiZANidine  (ZANAFLEX ) 2 MG tablet Take 1 tablet (2 mg total) by mouth every 6 (six) hours as needed for muscle spasms. 60 tablet 2   venlafaxine  XR (EFFEXOR -XR) 37.5 MG 24 hr capsule Take 1  capsule (37.5 mg total) by mouth daily with breakfast. 30 capsule 1   No current facility-administered medications for this visit.    VITAL SIGNS: BP 131/85 (BP Location: Left Arm, Patient Position: Sitting)   Pulse 70   Temp 97.9 F (36.6 C) (Temporal)   Resp 17   Wt 174 lb 12.8 oz (79.3 kg)   SpO2 100%   BMI 28.21 kg/m  Filed Weights   02/01/24 0930  Weight: 174 lb 12.8 oz (79.3 kg)    Estimated body mass index is 28.21 kg/m as calculated from the following:   Height as of 01/07/24: 5\' 6"  (1.676 m).   Weight as of this encounter: 174 lb 12.8 oz (79.3 kg).   PERFORMANCE STATUS (ECOG) : 1 - Symptomatic but completely ambulatory   Physical Exam General: NAD, thin  Cardiovascular: regular rate and rhythm Pulmonary: normal breathing pattern Extremities: no edema, no joint deformities Skin: no rashes Neurological: AAO x3  IMPRESSION: Discussed the use of AI scribe software for clinical note transcription with the patient, who gave verbal consent to proceed.  History of Present Illness Christian Simmons is a 38 year old male with a 20 year history of cancer  now with reoccurrence who presents for symptom management follow-up. Patient recently seen at the [facility] for a second opinion. During visit patient was determined to not be a candidate for additional therapies. He is accompanied today by his wife. Ambulatory. No acute distress noted.   Christian Simmons shares experience at [facility] which included hospitalization. During that time he was seen by their Palliative team who offered medication adjustments to manage his symptoms.   He is currently on dexamethasone  2mg , four tablets in the morning, four in the afternoon, and two at night, totaling ten mg per day. Dexamethasone  previously caused anxiety and panic attacks, especially during chemotherapy when he was on higher mg per day. He continues to experience agitation, mood swings, and jitteriness, which he attributes to the  medication. Encouraged to continue taking for pain and neck discomfort.   For sleep, he takes lorazepam , 0.75 mg at night, and finds it helpful for his anxiety. He also uses Dramamine and tizanidine  2 mg, to manage muscle stiffness and aid sleep. Without these medications, he cannot sleep due to the pressure. Education provided on use of Lunesta which was prescribed at discharge by [facility]. At this time patient would like to continue with current regimen. He is concerned about the potential for panic attacks and inquires about medication options to manage them without emergency visits. He currently uses lorazepam  for anxiety management.  He experiences significant pressure and tightness in the neck area, described as feeling like 'somebody choking me from the pain.' This sensation is not associated with pain but rather a tightness that feels like a band around his neck. The pressure intensifies as the day progresses, affecting his ability  to speak and causing muscle tension.  He has been taking hydromorphone  for back pain related to spinal issues but is considering switching back to oxycodone . He currently takes methadone , 5mg  in the morning and at night. He is cautious about using strong pain medications due to potential future needs.  With discussed at length if pain worsens there are options to manage his pain.  Mr. Enciso would like to transition back to oxycodone  10mg  every 4-6 hours as needed. Instructed to discontinue use of hydromorphone  in the setting of oxycodone  use. He verbalized understanding.   He mentions a recent issue with Medicaid coverage, which has affected his ability to obtain prescribed medications and a hospital bed. He is awaiting resolution of this issue to facilitate his care. Once resolved will send in appropriate prescriptions and DMe request.   His wife shares she is on FMLA and considering other options given awareness of Zakkery's current state and overall prognosis.    Goals of Care  We discussed the patient's current illness and what it means in the larger context of their on-going co-morbidities. Natural disease trajectory and expectations were discussed. Mr. Lantzy and his wife are realistic in their understanding of his cancer progression.  He inquires appropriately about expectations in the future in the setting of no treatment or interventions.  We discussed at length symptom management as needed.  Extensive education provided on the differences, goals and philosophy of care between palliative and hospice.  Education provided on what care would look like in the home with hospice support including symptom management, family support and counseling, and equipment needs.  I also discussed inpatient hospice unit criteria and appropriateness.  Patient and wife verbalized understanding and appreciation.  Hewey expresses wishes to remain under palliative services at this time with awareness that hospice will be most appropriate in the near future.  Offered outpatient palliative in the home in addition to our services here at the cancer center however patient declines at this time.  She expresses his wishes are to remain in the home for as long as he can amongst his family and friends.  I discussed the importance of continued conversation with family and their medical providers regarding overall plan of care and treatment options, ensuring decisions are within the context of the patients values and GOCs. Assessment & Plan Malignant neoplasmof spine Tumor infiltration causing significant pressure and muscle tension. Surgery not feasible due to high risks. Symptom management prioritized. - Continue dexamethasone  for symptom management. - Consider fentanyl  patch or increased methadone  dose if pain worsens. - Order hospital bed for posture and comfort.  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 Plan to switch to oxycodone  due  to concerns about hydromorphone  overuse. - Switch to oxycodone  20 mg at night as needed. - Continue methadone  5 mg in the morning and 5 mg at night. - Discontinue hydromorphone . 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 Concern about potential panic attacks. - Continue lorazepam  0.75 mg at bedtime. - Take 1 to 1.5 mg lorazepam  for panic attacks, max 2 mg. - Consider alternative muscle relaxants if tizanidine  ineffective.  Insomnia Insomnia managed with Dramamine. Considering Lunesta if current regimen fails. - Consider Lunesta 2 mg if Dramamine ineffective.  Goals of Care Under palliative care focusing on symptom management. Discussed hospice care transition if symptoms worsen or health further declines. - Continue palliative care  focusing on symptom management. - Discuss hospice care options if symptoms worsen. - Ensure understanding of hospice care options.  Follow-up Follow-up planned in one month. Awaiting Medicaid resolution for medication and hospital bed. - Schedule follow-up in four to five weeks. - Contact provider for prescription refills or symptom management. - Await Medicaid resolution for medication and hospital bed.  Patient expressed understanding and was in agreement with this plan. He also understands that He can call the clinic at any time with any questions, concerns, or complaints.   Any controlled substances utilized were prescribed in the context of palliative care. PDMP has been reviewed.   Visit consisted of counseling and education dealing with the complex and emotionally intense issues of symptom management and palliative care in the setting of serious and potentially life-threatening illness.  Dellia Ferguson, AGPCNP-BC  Palliative Medicine Team/[facility]

## 2024-02-02 ENCOUNTER — Other Ambulatory Visit (HOSPITAL_BASED_OUTPATIENT_CLINIC_OR_DEPARTMENT_OTHER): Payer: Self-pay

## 2024-02-02 ENCOUNTER — Encounter: Payer: Self-pay | Admitting: Hematology and Oncology

## 2024-02-02 ENCOUNTER — Other Ambulatory Visit: Payer: Self-pay

## 2024-02-02 ENCOUNTER — Inpatient Hospital Stay: Payer: Self-pay | Admitting: Licensed Clinical Social Worker

## 2024-02-02 ENCOUNTER — Other Ambulatory Visit (HOSPITAL_COMMUNITY): Payer: Self-pay

## 2024-02-02 MED ORDER — NYSTATIN 100000 UNIT/ML MT SUSP
5.0000 mL | Freq: Four times a day (QID) | OROMUCOSAL | 3 refills | Status: DC
  Filled 2024-02-02: qty 60, 3d supply, fill #0

## 2024-02-02 NOTE — Telephone Encounter (Signed)
 Pt called for refill of nystatin , see associated orders

## 2024-02-02 NOTE — Progress Notes (Addendum)
 [facility] Clinical Social Work  Initial Assessment   Christian Simmons is a 38 y.o. year old male accompanied by wife. Clinical Social Work was referred by self for assessment of psychosocial needs.   SDOH (Social Determinants of Health) assessments performed: Yes   SDOH Screenings   Food Insecurity: No Food Insecurity (01/13/2024)   Received from [facility]  Housing: Low Risk  (01/07/2024)  Transportation Needs: No Transportation Needs (01/13/2024)   Received from [facility]  Utilities: Not At Risk (01/13/2024)   [facility]  Tobacco Use: Low Risk  (02/01/2024)     Distress Screen completed: No   Family/Social Information:  Housing Arrangement: patient lives with his wife and their 3 children (4, 8, 10), one of whom has down syndrome Family members/support persons in your life? Family Transportation concerns: no  Employment: Not working. Wife was working, but has been on unpaid FMLA for about 1 month since health issues worsened for pt.  Income source: No income Financial concerns: Yes, due to illness and/or loss of work during treatment Type of concern: Utilities, Government social research officer, Transportation, and Food Food access concerns: yes, due to income issues Religious or spiritual practice:  yes Advanced directives: No Services Currently in place:  none. Pending Medicaid (pt had previously, is not sure why it discontinued)  Coping/ Adjustment to diagnosis: Patient understands treatment plan and what happens next? yes, has a long history of H&N cancer and had detailed discussion with palliative care about expectations Patient reported stressors: Finances Current coping skills/ strengths: Supportive family/friends     SUMMARY: Current SDOH Barriers:  Financial constraints related to loss of income  Clinical Social Work Clinical Goal(s):  Scientist, research (life sciences) options for unmet needs related to:  Financial Strain   Interventions: Discussed common feeling and  emotions when being diagnosed with cancer, and the importance of support during treatment for patient and family Provided CSW contact information and encouraged patient to call with any questions or concerns Referred patient to [employer] for assistance with SSI/SSDI and food stamps Referral sent to [employer] and J Cooper with DSS to follow-up on Medicaid status Once Medicaid is active, CSW will assist with a PCS referral and dentist list for patient Referral sent to [employer] for financial assistance grant Information given to pt/wife on other local resources and cancer foundations. Encouraged them to also look into SSI for their child with down syndrome Offered bag from [employer] and referrals to [employer] but pt declined   Follow Up Plan: Patient/family will contact CSW once Medicaid is active again to move forward with PCS referral and dental list. CSW will follow up in 1-2 weeks to ensure pt/family was contacted by agencies listed above regarding various referrals. Patient verbalizes understanding of plan: Yes    Sheilia Reznick E Demitrios Molyneux, LCSW Clinical Social Worker [facility]

## 2024-02-07 ENCOUNTER — Encounter: Payer: Self-pay | Admitting: Licensed Clinical Social Worker

## 2024-02-07 NOTE — Progress Notes (Signed)
 [facility] CSW Progress Note  Visual merchandiser  received VM from pt advocate with [employer], [place] . They are working with pt on financial assistance and pt asked them about a hospital bed. They have one available but are not able to deliver.  CSW attempted to contact Mr. Littie Rife, but had to leave a VM. There is not a clear resource for delivery aside from asking friends.family or faith community if someone can assist.  CSW also informed them that, once Medicaid is restarted, it could be ordered through insurance which the palliative team has discussed with the pt.     Kanoe Wanner E Reece Mcbroom, LCSW Clinical Social Worker [facility]

## 2024-02-09 ENCOUNTER — Other Ambulatory Visit (HOSPITAL_BASED_OUTPATIENT_CLINIC_OR_DEPARTMENT_OTHER): Payer: Self-pay

## 2024-02-09 ENCOUNTER — Other Ambulatory Visit: Payer: Self-pay | Admitting: Nurse Practitioner

## 2024-02-09 ENCOUNTER — Encounter: Payer: Self-pay | Admitting: Hematology and Oncology

## 2024-02-09 DIAGNOSIS — F419 Anxiety disorder, unspecified: Secondary | ICD-10-CM

## 2024-02-09 DIAGNOSIS — Z515 Encounter for palliative care: Secondary | ICD-10-CM

## 2024-02-09 DIAGNOSIS — G893 Neoplasm related pain (acute) (chronic): Secondary | ICD-10-CM

## 2024-02-09 DIAGNOSIS — C119 Malignant neoplasm of nasopharynx, unspecified: Secondary | ICD-10-CM

## 2024-02-09 MED ORDER — LORAZEPAM 0.5 MG PO TABS
0.5000 mg | ORAL_TABLET | Freq: Four times a day (QID) | ORAL | 0 refills | Status: DC | PRN
  Filled 2024-02-09: qty 45, 12d supply, fill #0

## 2024-02-09 MED ORDER — TIZANIDINE HCL 2 MG PO TABS
2.0000 mg | ORAL_TABLET | Freq: Four times a day (QID) | ORAL | 2 refills | Status: DC | PRN
Start: 2024-02-09 — End: 2024-02-26
  Filled 2024-02-09: qty 60, 15d supply, fill #0

## 2024-02-10 ENCOUNTER — Other Ambulatory Visit (HOSPITAL_COMMUNITY): Payer: Self-pay

## 2024-02-10 ENCOUNTER — Other Ambulatory Visit: Payer: Self-pay

## 2024-02-10 ENCOUNTER — Other Ambulatory Visit (HOSPITAL_BASED_OUTPATIENT_CLINIC_OR_DEPARTMENT_OTHER): Payer: Self-pay

## 2024-02-14 ENCOUNTER — Other Ambulatory Visit (HOSPITAL_BASED_OUTPATIENT_CLINIC_OR_DEPARTMENT_OTHER): Payer: Self-pay

## 2024-02-14 ENCOUNTER — Other Ambulatory Visit: Payer: Self-pay

## 2024-02-14 ENCOUNTER — Inpatient Hospital Stay: Payer: Self-pay | Attending: Hematology and Oncology

## 2024-02-14 ENCOUNTER — Encounter: Payer: Self-pay | Admitting: Hematology and Oncology

## 2024-02-14 DIAGNOSIS — C119 Malignant neoplasm of nasopharynx, unspecified: Secondary | ICD-10-CM | POA: Insufficient documentation

## 2024-02-14 DIAGNOSIS — C7800 Secondary malignant neoplasm of unspecified lung: Secondary | ICD-10-CM

## 2024-02-14 DIAGNOSIS — Z515 Encounter for palliative care: Secondary | ICD-10-CM

## 2024-02-14 DIAGNOSIS — Z79891 Long term (current) use of opiate analgesic: Secondary | ICD-10-CM | POA: Insufficient documentation

## 2024-02-14 MED ORDER — NYSTATIN 100000 UNIT/ML MT SUSP
Freq: Four times a day (QID) | OROMUCOSAL | 1 refills | Status: DC
  Filled 2024-02-14: qty 150, 14d supply, fill #0

## 2024-02-14 MED ORDER — FLUCONAZOLE 100 MG PO TABS
100.0000 mg | ORAL_TABLET | Freq: Every day | ORAL | 0 refills | Status: DC
  Filled 2024-02-14: qty 3, 3d supply, fill #0

## 2024-02-14 NOTE — Progress Notes (Signed)
 Orders placed per Lowella Bandy, NP

## 2024-02-15 ENCOUNTER — Other Ambulatory Visit: Payer: Self-pay | Admitting: *Deleted

## 2024-02-15 ENCOUNTER — Other Ambulatory Visit: Payer: Self-pay

## 2024-02-15 ENCOUNTER — Inpatient Hospital Stay: Payer: Self-pay

## 2024-02-15 ENCOUNTER — Other Ambulatory Visit (HOSPITAL_BASED_OUTPATIENT_CLINIC_OR_DEPARTMENT_OTHER): Payer: Self-pay

## 2024-02-15 ENCOUNTER — Other Ambulatory Visit: Payer: Self-pay | Admitting: Nurse Practitioner

## 2024-02-15 DIAGNOSIS — C7800 Secondary malignant neoplasm of unspecified lung: Secondary | ICD-10-CM

## 2024-02-15 DIAGNOSIS — Z515 Encounter for palliative care: Secondary | ICD-10-CM

## 2024-02-15 DIAGNOSIS — C119 Malignant neoplasm of nasopharynx, unspecified: Secondary | ICD-10-CM | POA: Diagnosis present

## 2024-02-15 DIAGNOSIS — G893 Neoplasm related pain (acute) (chronic): Secondary | ICD-10-CM

## 2024-02-15 DIAGNOSIS — Z95828 Presence of other vascular implants and grafts: Secondary | ICD-10-CM

## 2024-02-15 DIAGNOSIS — M1990 Unspecified osteoarthritis, unspecified site: Secondary | ICD-10-CM

## 2024-02-15 DIAGNOSIS — E039 Hypothyroidism, unspecified: Secondary | ICD-10-CM

## 2024-02-15 DIAGNOSIS — Z79891 Long term (current) use of opiate analgesic: Secondary | ICD-10-CM | POA: Diagnosis not present

## 2024-02-15 DIAGNOSIS — C760 Malignant neoplasm of head, face and neck: Secondary | ICD-10-CM

## 2024-02-15 LAB — CMP (CANCER CENTER ONLY)
ALT: 40 U/L (ref 0–44)
AST: 36 U/L (ref 15–41)
Albumin: 4.2 g/dL (ref 3.5–5.0)
Alkaline Phosphatase: 36 U/L — ABNORMAL LOW (ref 38–126)
Anion gap: 5 (ref 5–15)
BUN: 28 mg/dL — ABNORMAL HIGH (ref 6–20)
CO2: 34 mmol/L — ABNORMAL HIGH (ref 22–32)
Calcium: 9 mg/dL (ref 8.9–10.3)
Chloride: 99 mmol/L (ref 98–111)
Creatinine: 0.64 mg/dL (ref 0.61–1.24)
GFR, Estimated: >60 mL/min (ref >60)
Glucose, Bld: 157 mg/dL — ABNORMAL HIGH (ref 70–99)
Potassium: 4 mmol/L (ref 3.5–5.1)
Sodium: 138 mmol/L (ref 135–145)
Total Bilirubin: 0.3 mg/dL (ref 0.0–1.2)
Total Protein: 6.5 g/dL (ref 6.5–8.1)

## 2024-02-15 LAB — CBC WITH DIFFERENTIAL (CANCER CENTER ONLY)
Abs Immature Granulocytes: 0.26 10*3/uL — ABNORMAL HIGH (ref 0.00–0.07)
Basophils Absolute: 0 10*3/uL (ref 0.0–0.1)
Basophils Relative: 0 %
Eosinophils Absolute: 0 10*3/uL (ref 0.0–0.5)
Eosinophils Relative: 0 %
HCT: 33.8 % — ABNORMAL LOW (ref 39.0–52.0)
Hemoglobin: 11 g/dL — ABNORMAL LOW (ref 13.0–17.0)
Immature Granulocytes: 4 %
Lymphocytes Relative: 11 %
Lymphs Abs: 0.8 10*3/uL (ref 0.7–4.0)
MCH: 29.8 pg (ref 26.0–34.0)
MCHC: 32.5 g/dL (ref 30.0–36.0)
MCV: 91.6 fL (ref 80.0–100.0)
Monocytes Absolute: 0.5 10*3/uL (ref 0.1–1.0)
Monocytes Relative: 7 %
Neutro Abs: 5.9 10*3/uL (ref 1.7–7.7)
Neutrophils Relative %: 78 %
Platelet Count: 157 10*3/uL (ref 150–400)
RBC: 3.69 MIL/uL — ABNORMAL LOW (ref 4.22–5.81)
RDW: 20 % — ABNORMAL HIGH (ref 11.5–15.5)
WBC Count: 7.4 10*3/uL (ref 4.0–10.5)
nRBC: 0.5 % — ABNORMAL HIGH (ref 0.0–0.2)

## 2024-02-15 LAB — URINALYSIS, COMPLETE (UACMP) WITH MICROSCOPIC
Bacteria, UA: NONE SEEN
Bilirubin Urine: NEGATIVE
Glucose, UA: >=500 mg/dL — AB
Ketones, ur: 5 mg/dL — AB
Leukocytes,Ua: NEGATIVE
Nitrite: NEGATIVE
Protein, ur: 30 mg/dL — AB
Specific Gravity, Urine: 1.038 — ABNORMAL HIGH (ref 1.005–1.030)
pH: 5 (ref 5.0–8.0)

## 2024-02-15 LAB — T4, FREE: Free T4: 0.29 ng/dL — ABNORMAL LOW (ref 0.61–1.12)

## 2024-02-15 MED ORDER — SODIUM CHLORIDE 0.9% FLUSH
10.0000 mL | Freq: Once | INTRAVENOUS | Status: AC
  Administered 2024-02-15: 10 mL

## 2024-02-15 MED ORDER — CELECOXIB 200 MG PO CAPS
200.0000 mg | ORAL_CAPSULE | Freq: Two times a day (BID) | ORAL | 0 refills | Status: DC
Start: 2024-02-15 — End: 2024-02-26
  Filled 2024-02-15: qty 60, 30d supply, fill #0

## 2024-02-16 ENCOUNTER — Other Ambulatory Visit (HOSPITAL_BASED_OUTPATIENT_CLINIC_OR_DEPARTMENT_OTHER): Payer: Self-pay

## 2024-02-16 ENCOUNTER — Encounter: Payer: Self-pay | Admitting: Hematology and Oncology

## 2024-02-16 LAB — URINE CULTURE: Culture: NO GROWTH

## 2024-02-20 ENCOUNTER — Other Ambulatory Visit: Payer: Self-pay | Admitting: Hematology and Oncology

## 2024-02-21 ENCOUNTER — Other Ambulatory Visit (HOSPITAL_BASED_OUTPATIENT_CLINIC_OR_DEPARTMENT_OTHER): Payer: Self-pay

## 2024-02-21 ENCOUNTER — Other Ambulatory Visit (HOSPITAL_COMMUNITY): Payer: Self-pay

## 2024-02-21 ENCOUNTER — Inpatient Hospital Stay (HOSPITAL_BASED_OUTPATIENT_CLINIC_OR_DEPARTMENT_OTHER): Payer: Self-pay | Admitting: Nurse Practitioner

## 2024-02-21 ENCOUNTER — Encounter: Payer: Self-pay | Admitting: Hematology and Oncology

## 2024-02-21 ENCOUNTER — Ambulatory Visit (HOSPITAL_BASED_OUTPATIENT_CLINIC_OR_DEPARTMENT_OTHER)
Admission: RE | Admit: 2024-02-21 | Discharge: 2024-02-21 | Disposition: A | Discharge status: Home / Self Care | Source: Ambulatory Visit | Attending: Nurse Practitioner | Admitting: Nurse Practitioner

## 2024-02-21 ENCOUNTER — Encounter (HOSPITAL_BASED_OUTPATIENT_CLINIC_OR_DEPARTMENT_OTHER): Payer: Self-pay

## 2024-02-21 ENCOUNTER — Other Ambulatory Visit: Payer: Self-pay | Admitting: Nurse Practitioner

## 2024-02-21 ENCOUNTER — Inpatient Hospital Stay

## 2024-02-21 ENCOUNTER — Encounter: Payer: Self-pay | Admitting: Licensed Clinical Social Worker

## 2024-02-21 ENCOUNTER — Encounter: Payer: Self-pay | Admitting: Nurse Practitioner

## 2024-02-21 VITALS — BP 132/74 | HR 85 | Temp 97.9°F | Resp 17 | Ht 66.0 in | Wt 179.6 lb

## 2024-02-21 DIAGNOSIS — C119 Malignant neoplasm of nasopharynx, unspecified: Secondary | ICD-10-CM | POA: Diagnosis not present

## 2024-02-21 DIAGNOSIS — F419 Anxiety disorder, unspecified: Secondary | ICD-10-CM

## 2024-02-21 DIAGNOSIS — C770 Secondary and unspecified malignant neoplasm of lymph nodes of head, face and neck: Secondary | ICD-10-CM | POA: Insufficient documentation

## 2024-02-21 DIAGNOSIS — G893 Neoplasm related pain (acute) (chronic): Secondary | ICD-10-CM

## 2024-02-21 DIAGNOSIS — R530 Neoplastic (malignant) related fatigue: Secondary | ICD-10-CM

## 2024-02-21 DIAGNOSIS — C760 Malignant neoplasm of head, face and neck: Secondary | ICD-10-CM

## 2024-02-21 DIAGNOSIS — Z515 Encounter for palliative care: Secondary | ICD-10-CM

## 2024-02-21 DIAGNOSIS — R630 Anorexia: Secondary | ICD-10-CM

## 2024-02-21 DIAGNOSIS — R140 Abdominal distension (gaseous): Secondary | ICD-10-CM

## 2024-02-21 DIAGNOSIS — Z95828 Presence of other vascular implants and grafts: Secondary | ICD-10-CM

## 2024-02-21 DIAGNOSIS — C7800 Secondary malignant neoplasm of unspecified lung: Secondary | ICD-10-CM

## 2024-02-21 DIAGNOSIS — Z7189 Other specified counseling: Secondary | ICD-10-CM

## 2024-02-21 LAB — CMP (CANCER CENTER ONLY)
ALT: 40 U/L (ref 0–44)
AST: 19 U/L (ref 15–41)
Albumin: 4.2 g/dL (ref 3.5–5.0)
Alkaline Phosphatase: 40 U/L (ref 38–126)
Anion gap: 8 (ref 5–15)
BUN: 24 mg/dL — ABNORMAL HIGH (ref 6–20)
CO2: 32 mmol/L (ref 22–32)
Calcium: 9.2 mg/dL (ref 8.9–10.3)
Chloride: 97 mmol/L — ABNORMAL LOW (ref 98–111)
Creatinine: 0.6 mg/dL — ABNORMAL LOW (ref 0.61–1.24)
GFR, Estimated: >60 mL/min (ref >60)
Glucose, Bld: 176 mg/dL — ABNORMAL HIGH (ref 70–99)
Potassium: 3.9 mmol/L (ref 3.5–5.1)
Sodium: 137 mmol/L (ref 135–145)
Total Bilirubin: 0.4 mg/dL (ref 0.0–1.2)
Total Protein: 6.6 g/dL (ref 6.5–8.1)

## 2024-02-21 LAB — CBC WITH DIFFERENTIAL (CANCER CENTER ONLY)
Abs Immature Granulocytes: 0.67 10*3/uL — ABNORMAL HIGH (ref 0.00–0.07)
Basophils Absolute: 0.1 10*3/uL (ref 0.0–0.1)
Basophils Relative: 1 %
Eosinophils Absolute: 0 10*3/uL (ref 0.0–0.5)
Eosinophils Relative: 0 %
HCT: 35.9 % — ABNORMAL LOW (ref 39.0–52.0)
Hemoglobin: 11.7 g/dL — ABNORMAL LOW (ref 13.0–17.0)
Immature Granulocytes: 8 %
Lymphocytes Relative: 17 %
Lymphs Abs: 1.4 10*3/uL (ref 0.7–4.0)
MCH: 30.2 pg (ref 26.0–34.0)
MCHC: 32.6 g/dL (ref 30.0–36.0)
MCV: 92.8 fL (ref 80.0–100.0)
Monocytes Absolute: 0.5 10*3/uL (ref 0.1–1.0)
Monocytes Relative: 6 %
Neutro Abs: 5.7 10*3/uL (ref 1.7–7.7)
Neutrophils Relative %: 68 %
Platelet Count: 183 10*3/uL (ref 150–400)
RBC: 3.87 MIL/uL — ABNORMAL LOW (ref 4.22–5.81)
RDW: 19.6 % — ABNORMAL HIGH (ref 11.5–15.5)
Smear Review: NORMAL
WBC Count: 8.4 10*3/uL (ref 4.0–10.5)
nRBC: 1.1 % — ABNORMAL HIGH (ref 0.0–0.2)

## 2024-02-21 LAB — TSH: TSH: 3.89 u[IU]/mL (ref 0.350–4.500)

## 2024-02-21 MED ORDER — SODIUM CHLORIDE 0.9% FLUSH
10.0000 mL | Freq: Once | INTRAVENOUS | Status: AC
  Administered 2024-02-21: 10 mL

## 2024-02-21 MED ORDER — LORAZEPAM 0.5 MG PO TABS
0.5000 mg | ORAL_TABLET | Freq: Four times a day (QID) | ORAL | 0 refills | Status: DC | PRN
  Filled 2024-02-21: qty 60, 15d supply, fill #0

## 2024-02-21 MED ORDER — METHADONE HCL 10 MG PO TABS
10.0000 mg | ORAL_TABLET | Freq: Three times a day (TID) | ORAL | 0 refills | Status: DC
Start: 2024-02-21 — End: 2024-03-04
  Filled 2024-02-21 (×2): qty 90, 30d supply, fill #0

## 2024-02-21 MED ORDER — DEXAMETHASONE 2 MG PO TABS
4.0000 mg | ORAL_TABLET | Freq: Three times a day (TID) | ORAL | 1 refills | Status: DC
  Filled 2024-02-21 (×2): qty 180, 30d supply, fill #0

## 2024-02-21 MED ORDER — SODIUM CHLORIDE 0.9% FLUSH
10.0000 mL | Freq: Once | INTRAVENOUS | Status: AC
  Administered 2024-02-21: 10 mL via INTRAVENOUS

## 2024-02-21 MED ORDER — LEVOTHYROXINE SODIUM 175 MCG PO TABS
175.0000 ug | ORAL_TABLET | Freq: Every day | ORAL | 3 refills | Status: DC
  Filled 2024-02-21: qty 30, 30d supply, fill #0

## 2024-02-21 MED ORDER — IOHEXOL 300 MG/ML  SOLN
100.0000 mL | Freq: Once | INTRAMUSCULAR | Status: AC | PRN
  Administered 2024-02-21: 100 mL via INTRAVENOUS

## 2024-02-21 NOTE — Patient Instructions (Signed)

## 2024-02-21 NOTE — Progress Notes (Signed)
 [facility] CSW Progress Note  Visual merchandiser provided forms for [employer] (personal care services) to NP [facility] for completion. Pt is interested in seeing if spouse can be his paid caregiver. CSW shared information on this process with pt and that insurance will complete an evaluation and determine eligibility and then they can determine if his wife can be the caregiver.    Cherine Drumgoole E Alexsis Kathman, LCSW Clinical Social Worker [facility]    Patient is participating in a Managed Medicaid Plan:  Yes

## 2024-02-22 ENCOUNTER — Other Ambulatory Visit (HOSPITAL_BASED_OUTPATIENT_CLINIC_OR_DEPARTMENT_OTHER): Payer: Self-pay

## 2024-02-22 ENCOUNTER — Other Ambulatory Visit: Payer: Self-pay | Admitting: Nurse Practitioner

## 2024-02-22 ENCOUNTER — Encounter: Payer: Self-pay | Admitting: Hematology and Oncology

## 2024-02-22 DIAGNOSIS — Z515 Encounter for palliative care: Secondary | ICD-10-CM

## 2024-02-22 DIAGNOSIS — G893 Neoplasm related pain (acute) (chronic): Secondary | ICD-10-CM

## 2024-02-22 DIAGNOSIS — C119 Malignant neoplasm of nasopharynx, unspecified: Secondary | ICD-10-CM

## 2024-02-22 MED ORDER — HYDROMORPHONE HCL 2 MG PO TABS
2.0000 mg | ORAL_TABLET | ORAL | 0 refills | Status: DC | PRN
Start: 2024-02-22 — End: 2024-02-23
  Filled 2024-02-22: qty 90, 8d supply, fill #0

## 2024-02-22 NOTE — Progress Notes (Signed)
 Palliative Medicine [facility]  Telephone:([phone]) [phone] Fax:(336) [phone]   Name: Christian Simmons Date: 02/22/2024 MRN: [id number]  DOB: Feb 16, 1986  Patient Care Team: Patient, No Pcp Per as PCP - General (General Practice) Almeda Jacobs, MD as Consulting Physician (Hematology and Oncology) Rand Burrs, MD as Referring Physician (Plastic Surgery) Tobin Forts, MD as Consulting Physician (Gastroenterology) Pickenpack-Cousar, Giles Labrum, NP as Nurse Practitioner (Hospice and Palliative Medicine)    INTERVAL HISTORY: Christian Simmons is a 38 y.o. 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  Palliative is seeing patient for symptom management and goals of care.   SOCIAL HISTORY:     reports that he has never smoked. He has never used smokeless tobacco. He reports that he does not drink alcohol and does not use drugs.  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  MEDICATIONS:  Current Outpatient Medications  Medication Sig Dispense Refill   bisacodyl  (DULCOLAX) 10 MG suppository Place 1 suppository (10 mg total) rectally as needed for moderate constipation. 4 suppository 0   blood glucose meter kit and supplies KIT Dispense based on patient  and insurance preference. Use up to four times daily as directed. (FOR ICD-9 250.00, 250.01). 1 each 1   celecoxib  (CELEBREX ) 200 MG capsule Take 1 capsule (200 mg total) by mouth 2 (two) times daily. 60 capsule 0   cetirizine  (ZYRTEC ) 10 MG tablet Take 1 tablet (10 mg total) by mouth daily as needed for allergies. 100 tablet 0   dexamethasone  (DECADRON ) 2 MG tablet Take 2 tablets (4 mg total) by mouth 3 (three) times daily. 180 tablet 1   famotidine  (PEPCID ) 40 MG tablet Take 1 tablet (40 mg total) by mouth at bedtime. 30 tablet 3   gabapentin  (NEURONTIN ) 100 MG capsule Take 2 capsules (200 mg total) by mouth 2 (two) times daily. 120 capsule 3   levothyroxine  (SYNTHROID ) 175 MCG tablet Take 1 tablet (175 mcg total) by mouth daily before breakfast. 30 tablet 3   LORazepam  (ATIVAN ) 0.5 MG tablet Take 1 tablet (0.5 mg total) by mouth every 6 (six) hours as needed for anxiety. 60 tablet 0   alum & mag hydroxide-simeth-nystatin -diphenhydrAMINE  Swish and swallow 5ml 4 (four) times daily as directed. 150 mL 1   methadone  (DOLOPHINE ) 10 MG tablet Take 1 tablet (10 mg) by mouth every 8 hours. 90 tablet 0   ondansetron  (ZOFRAN ) 8 MG tablet Take 1 tablet (8 mg total) by mouth every 8 (eight) hours as needed for nausea or vomiting. 90 tablet 1   Oxycodone  HCl 20 MG TABS Take 1 tablet (20 mg total) by mouth every 4 (four) hours as needed for severe pain (pain score 7-10). 20 tablet 0   pantoprazole  (PROTONIX )  40 MG tablet Take 1 tablet (40 mg total) by mouth daily. 30 tablet 2   polyethylene glycol (MIRALAX  / GLYCOLAX ) 17 g packet Take 17 g by mouth 2 (two) times daily. 30 each 3   prochlorperazine  (COMPAZINE ) 10 MG tablet Take 1 tablet (10 mg total) by mouth every 6 (six) hours as needed for nausea or vomiting. 30 tablet 2   senna-docusate (SENOKOT-S) 8.6-50 MG tablet Take 2 tablets by mouth 2 (two) times daily. 100 tablet 3   tiZANidine  (ZANAFLEX ) 2 MG tablet Take 1 tablet (2 mg total) by mouth every 6 (six)  hours as needed for muscle spasms. 60 tablet 2   venlafaxine  XR (EFFEXOR -XR) 37.5 MG 24 hr capsule Take 1 capsule (37.5 mg total) by mouth daily with breakfast. 30 capsule 1   No current facility-administered medications for this visit.    VITAL SIGNS: BP 132/74 (BP Location: Left Arm, Patient Position: Sitting)   Pulse 85   Temp 97.9 F (36.6 C) (Temporal)   Resp 17   Ht 5\' 6"  (1.676 m)   Wt 179 lb 9.6 oz (81.5 kg)   SpO2 100%   BMI 28.99 kg/m  Filed Weights   02/21/24 1159  Weight: 179 lb 9.6 oz (81.5 kg)    Estimated body mass index is 28.99 kg/m as calculated from the following:   Height as of this encounter: 5\' 6"  (1.676 m).   Weight as of this encounter: 179 lb 9.6 oz (81.5 kg).   PERFORMANCE STATUS (ECOG) : 1 - Symptomatic but completely ambulatory   Physical Exam General: NAD, thin  Cardiovascular: regular rate and rhythm Pulmonary: normal breathing pattern Extremities: no edema, no joint deformities Skin: no rashes Neurological: AAO x3  IMPRESSION: Discussed the use of AI scribe software for clinical note transcription with the patient, who gave verbal consent to proceed.  History of Present Illness Christian Simmons is a 38 year old male with 20 year head and neck cancer history who presents with abdominal distension and difficulty breathing. Previously evaluated at [facility] for second opinion. Recommendations was for palliative support and hospice when appropriate for patient.  He is accompanied by his wife. No acute distress on exam. Patient able to speak without difficulty and engage in discussions. He is tearful during visit given recent increase in symptom burden. Patient's Medicaid has been recently reinstated.   He has significant abdominal distension described as 'expanding', causing difficulty breathing and sweating. The swelling is bilateral, leading to discomfort and pressure, which hinders daily activities. He has not had fluid removed before and is unsure  of the cause of the swelling. Education provided on potential causes with specific concerns related to his cancer.   Christian Simmons experiences fatigue and weakness, particularly in his legs, and reports that his hands feel heavy and cold. He is unable to raise his hands above his head and struggles to stand up from a seated position. He also reports shortness of breath.  He is currently taking Dilaudid  for pain management and uses MiraLax  once daily as a stool softener. He endorses occasional constipation. Patient instructed to increase Miralax  to twice daily.   He has a history of hypothyroidism and anemia, with blood work scheduled for today to assess his current status. He feels fatigued all the time, which he believes is related to his anemia. Will draw CBC, CMP, and send patient for STAT Chest abdomen and pelvis scan to assess cancer progression and possibility of ascites given abdominal distention, tenderness, and shortness of  breath. Patient and wife verbalized understanding.   He continues to  experiences significant pressure and tightness in the neck area also reporting feelings of choking at times. This despite current pain regimen including dexamethasone .   We discussed his pain at length. Byrl is taking hydromorphone  as needed, methadone  10mg  three times daily, in addition to dexamethasone  4mg  three times daily. He previously requested to transition back to oxycodone  however due to his increased pain finds the hydromorphone  works better. At this time we will continue current regimen. No adjustments.   Goals of Care I again approached goals of care discussions expressing concerns for cancer progression and increase in symptom burden. Patient is inquiring about a PCP. Advised this can assist with his needs however offered in home support with local palliative and hospice agency. Patient politely declines at this time. Education provided on what support in the home would look like however Mr.  Simmons declines. He continues to desire treating his symptoms for now. Wife expressed understanding and support of husband's wishes.   02/01/24:  We discussed the patient's current illness and what it means in the larger context of their on-going co-morbidities. Natural disease trajectory and expectations were discussed. Christian Simmons and his wife are realistic in their understanding of his cancer progression.  He inquires appropriately about expectations in the future in the setting of no treatment or interventions.  We discussed at length symptom management as needed.  Extensive education provided on the differences, goals and philosophy of care between palliative and hospice.  Education provided on what care would look like in the home with hospice support including symptom management, family support and counseling, and equipment needs.  I also discussed inpatient hospice unit criteria and appropriateness.  Patient and wife verbalized understanding and appreciation.  Christian Simmons expresses wishes to remain under palliative services at this time with awareness that hospice will be most appropriate in the near future.  Offered outpatient palliative in the home in addition to our services here at the cancer center however patient declines at this time.  She expresses his wishes are to remain in the home for as long as he can amongst his family and friends.  I discussed the importance of continued conversation with family and their medical providers regarding overall plan of care and treatment options, ensuring decisions are within the context of the patients values and GOCs. Assessment & Plan Malignant neoplasmof spine Tumor infiltration causing significant pressure and muscle tension. Surgery not feasible due to high risks. Symptom management prioritized. - Continue dexamethasone  4 mg 3 times daily for symptom management. - Consider fentanyl  patch or increased methadone  dose if pain worsens. - Order hospital  bed for posture and comfort.  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 Plan to switch to oxycodone  due to concerns about hydromorphone  overuse. - Hydromorphone  4 mg every 4 hours as needed for moderate to severe pain - Continue methadone  10 mg every 8 hours. 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 Concern about potential panic attacks. - Continue lorazepam  0.75 mg at bedtime. - Take 1 to 1.5 mg lorazepam  for panic attacks, max 2 mg. - Consider alternative muscle relaxants if tizanidine  ineffective.  Insomnia Insomnia managed with Dramamine. Considering Lunesta if current regimen fails. - Consider Lunesta 2 mg if Dramamine ineffective.  Abdominal distention Ascites likely related to cancer, causing abdominal distension and  discomfort. Concerns about liver and bowel involvement. Fluid accumulation suspected, possibly requiring drainage. - Order abdominal x-ray and CT to assess fluid accumulation and anatomical changes. - Refer to interventional radiology for paracentesis if fluid is present. - Consider emergency room referral for urgent intervention if imaging cannot be arranged within 24 hours.  Anemia Anemia suspected, contributing to fatigue and weakness. - Obtain blood work to assess anemia.  Hypothyroidism Hypothyroidism mentioned but not discussed in detail. - Continue current management of hypothyroidism. Goals of Care Under palliative care focusing on symptom management. Discussed hospice care transition if symptoms worsen or health further declines. - Continue palliative care focusing on symptom management. - Discuss hospice care options if symptoms worsen. - Ensure understanding of hospice care options.  Follow-up Follow-up planned in one month. Awaiting Medicaid resolution for medication and  hospital bed. - Schedule follow-up in 1-2 weeks. - Contact provider for prescription refills or symptom management. - Hospital bed ordered   Patient expressed understanding and was in agreement with this plan. He also understands that He can call the clinic at any time with any questions, concerns, or complaints.   Any controlled substances utilized were prescribed in the context of palliative care. PDMP has been reviewed.   I provided 65 minutes of face-to-face visit time during this encounter, and > 50% was spent counseling as documented under my assessment & plan. Visit consisted of counseling and education dealing with the complex and emotionally intense issues of symptom management and palliative care in the setting of serious and potentially life-threatening illness.  Dellia Ferguson, AGPCNP-BC  Palliative Medicine Team/[facility]

## 2024-02-23 ENCOUNTER — Other Ambulatory Visit (HOSPITAL_BASED_OUTPATIENT_CLINIC_OR_DEPARTMENT_OTHER): Payer: Self-pay

## 2024-02-23 ENCOUNTER — Other Ambulatory Visit: Payer: Self-pay | Admitting: Nurse Practitioner

## 2024-02-23 DIAGNOSIS — F419 Anxiety disorder, unspecified: Secondary | ICD-10-CM

## 2024-02-23 DIAGNOSIS — C7800 Secondary malignant neoplasm of unspecified lung: Secondary | ICD-10-CM

## 2024-02-23 DIAGNOSIS — C119 Malignant neoplasm of nasopharynx, unspecified: Secondary | ICD-10-CM

## 2024-02-23 DIAGNOSIS — Z515 Encounter for palliative care: Secondary | ICD-10-CM

## 2024-02-23 DIAGNOSIS — G893 Neoplasm related pain (acute) (chronic): Secondary | ICD-10-CM

## 2024-02-23 MED ORDER — MORPHINE SULFATE (CONCENTRATE) 10 MG /0.5 ML PO SOLN
5.0000 mg | ORAL | 0 refills | Status: DC | PRN
  Filled 2024-02-23: qty 60, 20d supply, fill #0

## 2024-02-24 ENCOUNTER — Inpatient Hospital Stay: Admitting: Licensed Clinical Social Worker

## 2024-02-24 NOTE — Progress Notes (Signed)
 [facility] CSW Progress Note  Visual merchandiser met with patient and pt's wife to discuss resources and provide clarification on paperwork. Pt & wife had the following questions & updates:  Medicaid- approved and active. It is under [employer] referral was faxed 02/22/24 by N. Pickenpack Cousar. CSW called [facility] to follow-up, but they have not filed it yet  Food stamps- approved and starting 03/05/2024  SSDI/ SSI- SSDI application was denied. SSI is pending. Left VM and sent e-mail to [facility] with [facility] requesting update and to follow-up with pt/spouse  [employer]- pt brought in application. Completed today and CSW faxed to [employer]  Wife's FMLA- ending in June. Nimo asked about other job protections. CSW is not aware of any. Request sent to T[facility] Cancer for appointment with Legal & Financial Navigation team   Pt requested that all communication go through his wife first. CSW made a note in pt's chart of this request.  Pt reported being in significant pain. CSW sent message to palliative team who relayed advice to go to the ED. Pt declined at this time and said he will try the morphine  once more.    Adelle Zachar E Nicholus Chandran, LCSW Clinical Social Worker [facility]    Patient is participating in a Managed Medicaid Plan:  Yes

## 2024-02-25 ENCOUNTER — Observation Stay (HOSPITAL_COMMUNITY)
Admission: EM | Admit: 2024-02-25 | Discharge: 2024-02-26 | Disposition: A | Discharge status: Home / Self Care | Attending: Internal Medicine | Admitting: Internal Medicine

## 2024-02-25 ENCOUNTER — Other Ambulatory Visit

## 2024-02-25 ENCOUNTER — Encounter (HOSPITAL_COMMUNITY): Payer: Self-pay

## 2024-02-25 ENCOUNTER — Ambulatory Visit: Admitting: Hematology and Oncology

## 2024-02-25 ENCOUNTER — Other Ambulatory Visit: Payer: Self-pay

## 2024-02-25 ENCOUNTER — Ambulatory Visit

## 2024-02-25 DIAGNOSIS — E1140 Type 2 diabetes mellitus with diabetic neuropathy, unspecified: Secondary | ICD-10-CM | POA: Insufficient documentation

## 2024-02-25 DIAGNOSIS — C760 Malignant neoplasm of head, face and neck: Principal | ICD-10-CM | POA: Diagnosis present

## 2024-02-25 DIAGNOSIS — E119 Type 2 diabetes mellitus without complications: Secondary | ICD-10-CM

## 2024-02-25 DIAGNOSIS — E039 Hypothyroidism, unspecified: Secondary | ICD-10-CM | POA: Diagnosis not present

## 2024-02-25 DIAGNOSIS — Z7189 Other specified counseling: Secondary | ICD-10-CM

## 2024-02-25 DIAGNOSIS — E0840 Diabetes mellitus due to underlying condition with diabetic neuropathy, unspecified: Secondary | ICD-10-CM | POA: Diagnosis present

## 2024-02-25 DIAGNOSIS — M542 Cervicalgia: Secondary | ICD-10-CM | POA: Diagnosis present

## 2024-02-25 DIAGNOSIS — R52 Pain, unspecified: Secondary | ICD-10-CM | POA: Diagnosis present

## 2024-02-25 DIAGNOSIS — Z79899 Other long term (current) drug therapy: Secondary | ICD-10-CM

## 2024-02-25 DIAGNOSIS — G893 Neoplasm related pain (acute) (chronic): Secondary | ICD-10-CM | POA: Diagnosis not present

## 2024-02-25 DIAGNOSIS — R4589 Other symptoms and signs involving emotional state: Secondary | ICD-10-CM

## 2024-02-25 DIAGNOSIS — C7951 Secondary malignant neoplasm of bone: Secondary | ICD-10-CM

## 2024-02-25 DIAGNOSIS — Z66 Do not resuscitate: Secondary | ICD-10-CM

## 2024-02-25 LAB — COMPREHENSIVE METABOLIC PANEL WITH GFR
ALT: 58 U/L — ABNORMAL HIGH (ref 0–44)
AST: 30 U/L (ref 15–41)
Albumin: 4 g/dL (ref 3.5–5.0)
Alkaline Phosphatase: 42 U/L (ref 38–126)
Anion gap: 12 (ref 5–15)
BUN: 27 mg/dL — ABNORMAL HIGH (ref 6–20)
CO2: 29 mmol/L (ref 22–32)
Calcium: 9.5 mg/dL (ref 8.9–10.3)
Chloride: 92 mmol/L — ABNORMAL LOW (ref 98–111)
Creatinine, Ser: 0.54 mg/dL — ABNORMAL LOW (ref 0.61–1.24)
GFR, Estimated: >60 mL/min (ref >60)
Glucose, Bld: 224 mg/dL — ABNORMAL HIGH (ref 70–99)
Potassium: 3.8 mmol/L (ref 3.5–5.1)
Sodium: 133 mmol/L — ABNORMAL LOW (ref 135–145)
Total Bilirubin: 0.7 mg/dL (ref 0.0–1.2)
Total Protein: 7.1 g/dL (ref 6.5–8.1)

## 2024-02-25 LAB — GLUCOSE, CAPILLARY: Glucose-Capillary: 212 mg/dL — ABNORMAL HIGH (ref 70–99)

## 2024-02-25 LAB — CBC WITH DIFFERENTIAL/PLATELET
Abs Immature Granulocytes: 0.52 10*3/uL — ABNORMAL HIGH (ref 0.00–0.07)
Basophils Absolute: 0.1 10*3/uL (ref 0.0–0.1)
Basophils Relative: 1 %
Eosinophils Absolute: 0 10*3/uL (ref 0.0–0.5)
Eosinophils Relative: 0 %
HCT: 40.1 % (ref 39.0–52.0)
Hemoglobin: 12.5 g/dL — ABNORMAL LOW (ref 13.0–17.0)
Immature Granulocytes: 6 %
Lymphocytes Relative: 11 %
Lymphs Abs: 1 10*3/uL (ref 0.7–4.0)
MCH: 30.3 pg (ref 26.0–34.0)
MCHC: 31.2 g/dL (ref 30.0–36.0)
MCV: 97.1 fL (ref 80.0–100.0)
Monocytes Absolute: 0.6 10*3/uL (ref 0.1–1.0)
Monocytes Relative: 6 %
Neutro Abs: 6.7 10*3/uL (ref 1.7–7.7)
Neutrophils Relative %: 76 %
Platelets: 188 10*3/uL (ref 150–400)
RBC: 4.13 MIL/uL — ABNORMAL LOW (ref 4.22–5.81)
RDW: 19.6 % — ABNORMAL HIGH (ref 11.5–15.5)
WBC: 8.8 10*3/uL (ref 4.0–10.5)
nRBC: 0.8 % — ABNORMAL HIGH (ref 0.0–0.2)

## 2024-02-25 LAB — VITAMIN B12: Vitamin B-12: 208 pg/mL (ref 180–914)

## 2024-02-25 LAB — HEMOGLOBIN A1C
Hgb A1c MFr Bld: 6.4 % — ABNORMAL HIGH (ref 4.8–5.6)
Mean Plasma Glucose: 136.98 mg/dL

## 2024-02-25 MED ORDER — HYDROMORPHONE HCL 1 MG/ML IJ SOLN
1.0000 mg | Freq: Once | INTRAMUSCULAR | Status: AC
  Administered 2024-02-25: 1 mg via INTRAVENOUS
  Filled 2024-02-25: qty 1

## 2024-02-25 MED ORDER — GABAPENTIN 100 MG PO CAPS
200.0000 mg | ORAL_CAPSULE | Freq: Two times a day (BID) | ORAL | Status: DC
  Administered 2024-02-25: 200 mg via ORAL
  Filled 2024-02-25 (×2): qty 2

## 2024-02-25 MED ORDER — SODIUM CHLORIDE 0.9% FLUSH
10.0000 mL | Freq: Two times a day (BID) | INTRAVENOUS | Status: DC
  Administered 2024-02-25 – 2024-02-26 (×2): 10 mL

## 2024-02-25 MED ORDER — ACETAMINOPHEN 650 MG RE SUPP: 650.0000 mg | Freq: Four times a day (QID) | RECTAL | Status: DC | PRN

## 2024-02-25 MED ORDER — METHADONE HCL 5 MG PO TABS
10.0000 mg | ORAL_TABLET | Freq: Three times a day (TID) | ORAL | Status: DC
  Administered 2024-02-25 – 2024-02-26 (×2): 10 mg via ORAL
  Filled 2024-02-25 (×3): qty 2

## 2024-02-25 MED ORDER — METHADONE HCL 5 MG PO TABS: 5.0000 mg | ORAL_TABLET | Freq: Three times a day (TID) | ORAL | Status: DC

## 2024-02-25 MED ORDER — SODIUM CHLORIDE 0.9 % IV SOLN: INTRAVENOUS | Status: DC

## 2024-02-25 MED ORDER — SODIUM CHLORIDE 0.9 % IV BOLUS
1000.0000 mL | Freq: Once | INTRAVENOUS | Status: AC
  Administered 2024-02-25: 1000 mL via INTRAVENOUS

## 2024-02-25 MED ORDER — INSULIN ASPART 100 UNIT/ML IJ SOLN
0.0000 [IU] | Freq: Three times a day (TID) | INTRAMUSCULAR | Status: DC
  Administered 2024-02-26: 2 [IU] via SUBCUTANEOUS

## 2024-02-25 MED ORDER — LORAZEPAM 0.5 MG PO TABS
0.5000 mg | ORAL_TABLET | Freq: Four times a day (QID) | ORAL | Status: DC | PRN
Start: 2024-02-25 — End: 2024-02-26
  Administered 2024-02-25 – 2024-02-26 (×3): 0.5 mg via ORAL
  Filled 2024-02-25 (×3): qty 1

## 2024-02-25 MED ORDER — PANTOPRAZOLE SODIUM 40 MG PO TBEC
40.0000 mg | DELAYED_RELEASE_TABLET | Freq: Every day | ORAL | Status: DC
  Filled 2024-02-25: qty 1

## 2024-02-25 MED ORDER — SODIUM CHLORIDE 0.9% FLUSH: 10.0000 mL | INTRAVENOUS | Status: DC | PRN

## 2024-02-25 MED ORDER — GABAPENTIN 100 MG PO CAPS: 100.0000 mg | ORAL_CAPSULE | Freq: Every evening | ORAL | Status: DC | PRN

## 2024-02-25 MED ORDER — MORPHINE SULFATE (PF) 4 MG/ML IV SOLN: 4.0000 mg | Freq: Once | INTRAVENOUS | Status: DC

## 2024-02-25 MED ORDER — HYDROMORPHONE 1 MG/ML IV SOLN
INTRAVENOUS | Status: DC
  Administered 2024-02-25: 30 mg via INTRAVENOUS
  Filled 2024-02-25: qty 30

## 2024-02-25 MED ORDER — CHLORHEXIDINE GLUCONATE CLOTH 2 % EX PADS
6.0000 | MEDICATED_PAD | Freq: Every day | CUTANEOUS | Status: DC
  Administered 2024-02-26: 6 via TOPICAL

## 2024-02-25 MED ORDER — NYSTATIN 100000 UNIT/ML MT SUSP
5.0000 mL | Freq: Four times a day (QID) | OROMUCOSAL | Status: DC
Start: 2024-02-25 — End: 2024-02-26
  Administered 2024-02-25 – 2024-02-26 (×3): 500000 [IU] via ORAL
  Filled 2024-02-25 (×3): qty 5

## 2024-02-25 MED ORDER — TIZANIDINE HCL 2 MG PO TABS: 2.0000 mg | ORAL_TABLET | Freq: Four times a day (QID) | ORAL | Status: DC | PRN

## 2024-02-25 MED ORDER — HYDROMORPHONE HCL 1 MG/ML IJ SOLN
1.0000 mg | INTRAMUSCULAR | Status: DC | PRN
  Administered 2024-02-25 (×2): 1 mg via INTRAVENOUS
  Filled 2024-02-25 (×2): qty 1

## 2024-02-25 MED ORDER — ONDANSETRON HCL 4 MG/2ML IJ SOLN
4.0000 mg | Freq: Four times a day (QID) | INTRAMUSCULAR | Status: DC | PRN
  Filled 2024-02-25: qty 2

## 2024-02-25 MED ORDER — DIMENHYDRINATE 50 MG PO TABS: 50.0000 mg | ORAL_TABLET | Freq: Three times a day (TID) | ORAL | Status: DC | PRN

## 2024-02-25 MED ORDER — ONDANSETRON HCL 4 MG/2ML IJ SOLN
4.0000 mg | Freq: Once | INTRAMUSCULAR | Status: AC
  Administered 2024-02-25: 4 mg via INTRAVENOUS
  Filled 2024-02-25: qty 2

## 2024-02-25 MED ORDER — ONDANSETRON HCL 4 MG PO TABS: 4.0000 mg | ORAL_TABLET | Freq: Four times a day (QID) | ORAL | Status: DC | PRN

## 2024-02-25 MED ORDER — LEVOTHYROXINE SODIUM 25 MCG PO TABS
175.0000 ug | ORAL_TABLET | Freq: Every day | ORAL | Status: DC
  Filled 2024-02-25: qty 1
  Filled 2024-02-25: qty 2

## 2024-02-25 MED ORDER — ACETAMINOPHEN 325 MG PO TABS: 650.0000 mg | ORAL_TABLET | Freq: Four times a day (QID) | ORAL | Status: DC | PRN

## 2024-02-25 MED ORDER — DEXAMETHASONE 4 MG PO TABS
4.0000 mg | ORAL_TABLET | Freq: Three times a day (TID) | ORAL | Status: DC
  Administered 2024-02-25 – 2024-02-26 (×2): 4 mg via ORAL
  Filled 2024-02-25 (×2): qty 1

## 2024-02-25 MED ORDER — POLYETHYLENE GLYCOL 3350 17 G PO PACK
17.0000 g | PACK | Freq: Two times a day (BID) | ORAL | Status: DC
  Administered 2024-02-26 (×2): 17 g via ORAL
  Filled 2024-02-25 (×2): qty 1

## 2024-02-25 NOTE — Progress Notes (Signed)
 AddendumAvanell Bob with [employer] spoke with pt's wife. Unfortunately, pt started the SSI/SSDI application on his own, so Avanell Bob is unable to assist as she is not the authorized representative.

## 2024-02-25 NOTE — Assessment & Plan Note (Signed)
 TSH wnl this month Continue current synthroid  dosing

## 2024-02-25 NOTE — ED Triage Notes (Addendum)
 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 Complaining of not being able to get pain under control. Denies chest pain or shortness of breath. Has pain where his neck is. Is no longer doing chemo/radiation.

## 2024-02-25 NOTE — Assessment & Plan Note (Addendum)
 38 year old male with 20 year history of head and neck cancer with mets to lung/bone who presented to ED with intractable pain in his neck after his pain medications were recently changed. Pain could not be controlled in ED so he was admitted for intractable pain.  -obs to med surg -gentle IVF -check B12  -admitted in April 2025 with similar history of pain and required a dilaudid  PCA. Oncology has no other systemic treatment to offer him. He was seen at [facility] following this for a second opinion and recommended palliative support and then hospice when appropriate.  -He is being followed by palliative care and recently had his pain medication changed to liquid morphine  as they couldn't find liquid dilaudid . He continues to take methadone  10mg  TID. When they changed him to liquid morphine  his pain became uncontrolled and he came to ED -he is up pacing in room and doesn't want to be drowsy from the pain medication, but wants his pain controlled. He wanted to decrease or stop things, but discussed this wouldn't control his pain which is why he is here.  -will keep methadone  the same at 10mg  q 8 hours, tizanidine  PRN, steroids and then try dilaudid  1mg  q 2 hours PRN IV. He also has gabapentin  and ativan . Discussed all of these could potentially cause drowsiness. May require PCA, but will try for PRN dilaudid  to see if this helps not make him too drowsy. Pain is tolerable right now . -palliative aware he is here and have been consulted -discussed with wife that hospice is likely next step. She will discuss with palliative care, but remains full code for now

## 2024-02-25 NOTE — H&P (Signed)
 History and Physical    Patient: Christian Simmons GNF:[id number] DOB: 22-Jan-1986 DOA: 02/25/2024 DOS: the patient was seen and examined on 02/25/2024 PCP: Patient, No Pcp Per  Patient coming from: Home - lives with his wife and 3 kids. Needs assistance with walking at times    Chief Complaint: intractable pain in neck due to cancer   HPI: Christian Simmons is a 38 y.o. 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 He has been followed by palliative care for pain control. Recently asked for dilaudid  liquid, but they couldn't find this anywhere so he was changed to morphine  liquid and this didn't control his pain at all so he came to ED.   Admitted here in April with similar pain. MRI cervical spine showed findings concerning for metastatic disease or direct invasion of the foraminal/epidural process. Oncology has no other treatment available from a systemic standpoint. He was then seen at [facility] for a second opinion and recommendations were for palliative support and hospice when appropriate.   Denies any fever/chills, vision changes/headaches, chest pain or palpitations, shortness of breath or cough, abdominal pain, N/V/D, dysuria or leg swelling. He is complaining of tightness in his neck and doesn't want to be drowsy from pain medication.    He does not smoke or drink alcohol.   ER Course:  vitals: afebrile, bp: 134/74, HR: 85, RR: 16, oxygen: 100%RA Pertinent labs: none In ED: given dilaudid , TRH asked to admit.    Review of Systems: As mentioned in the history of present illness. All other systems reviewed and are negative. 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 He has never used smokeless tobacco. He reports that he does not drink alcohol and does not use drugs.  Allergies  Allergen Reactions   Phenergan  [Promethazine  Hcl] Anxiety    Causes patients HR to go into the 140's with severe anxiety   Heparin  Other (See Comments)    No Pork derivatives due to religion   Pork-Derived Products Other (See Comments)    Religious reasons   Clindamycin Rash    Family History  Problem Relation Age of Onset   Hypertension Mother    Diabetes Mother    Hypertension Father    Diabetes Father     Prior to Admission medications   Medication Sig Start Date End Date Taking? Authorizing Provider  alum & mag hydroxide-simeth-nystatin -diphenhydrAMINE  Swish and swallow 5ml 4 (four) times daily as directed. 02/14/24  Yes Pickenpack-Cousar, Giles Labrum, NP  celecoxib  (CELEBREX ) 200 MG capsule Take 1 capsule (200 mg total) by mouth 2 (two) times daily. 02/15/24  Yes Pickenpack-Cousar, Giles Labrum, NP  dexamethasone  (DECADRON ) 2 MG tablet Take 2 tablets (4 mg total) by mouth 3 (three) times daily. 02/21/24  Yes Pickenpack-Cousar, Giles Labrum, NP  dimenhyDRINATE (DRAMAMINE) 50 MG tablet Take 50 mg by mouth every 8 (eight) hours as needed.   Yes [provider]  gabapentin   (NEURONTIN ) 100 MG capsule Take 2 capsules (200 mg total) by mouth 2 (two) times daily. 10/22/23  Yes Gorsuch, Ni, MD  ibuprofen (ADVIL) 200 MG tablet Take 400 mg by mouth every 6 (six) hours as needed for moderate pain (pain score 4-6) or mild pain (pain score 1-3).   Yes [provider]  levothyroxine  (SYNTHROID ) 175 MCG tablet Take 1 tablet (175 mcg total) by mouth daily before breakfast. 02/21/24  Yes Gorsuch, Ni, MD  LORazepam  (ATIVAN ) 0.5 MG tablet Take 1 tablet (0.5 mg total) by mouth every 6 (six) hours as needed for anxiety. 02/21/24  Yes Pickenpack-Cousar, Athena N, NP  methadone  (DOLOPHINE ) 10 MG tablet Take 1 tablet (10 mg) by mouth every 8 hours. 02/21/24  Yes Pickenpack-Cousar, Athena N, NP  Morphine  Sulfate (MORPHINE  CONCENTRATE) 10 mg / 0.5 ml concentrated solution Take 0.25-0.5 mLs (5-10 mg total) by mouth every 4 (four) hours as needed for severe pain (pain score 7-10), shortness of breath, anxiety or moderate pain (pain score 4-6). 02/23/24  Yes Pickenpack-Cousar, Giles Labrum, NP  Oxycodone  HCl 10 MG TABS Take 1 tablet by mouth every 4 (four) hours as needed.   Yes [provider]  Oxycodone  HCl 20 MG TABS Take 1 tablet by mouth every 4 (four) hours as needed.   Yes [provider]  pantoprazole  (PROTONIX ) 40 MG tablet Take 1 tablet (40 mg total) by mouth daily. 10/26/23  Yes Gorsuch, Ni, MD  polyethylene glycol (MIRALAX  / GLYCOLAX ) 17 g packet Take 17 g by mouth 2 (two) times daily. 09/10/23  Yes Almeda Jacobs, MD  prochlorperazine  (COMPAZINE ) 10 MG tablet Take 1 tablet (10 mg total) by mouth every 6 (six) hours as needed for nausea or vomiting. 05/07/23  Yes Almeda Jacobs, MD  tiZANidine  (ZANAFLEX ) 2 MG tablet Take 1 tablet (2 mg total) by mouth every 6 (six) hours as needed for muscle spasms. 02/09/24  Yes Pickenpack-Cousar, Giles Labrum, NP  blood glucose meter kit and supplies KIT Dispense based on patient and insurance preference. Use up to four times daily as directed. (FOR  ICD-9 250.00, 250.01). 11/11/20   Almeda Jacobs, MD    Physical Exam: Vitals:   02/25/24 1445 02/25/24 1653 02/25/24 1754  BP: 134/74 (!) 158/105 (!) 172/97  Pulse: 85 82 82  Resp: 16 18 18   Temp: 99 F (37.2 C)  97.6 F (36.4 C)  TempSrc: Oral  Oral  SpO2: 100% 96% 99%  Weight: 82 kg    Height: 5\' 6"  (1.676 m)     General:  Appears calm and comfortable and is in NAD. Thin.  Eyes:  PERRL, EOMI, normal lids, iris ENT:  grossly normal hearing, lips & tongue, mmm; appropriate dentition Neck:  no LAD, masses or thyromegaly; no carotid bruits. Large scar on right side of neck  Cardiovascular:  RRR, no m/r/g. No LE edema.  Respiratory:   CTA bilaterally with no wheezes/rales/rhonchi.  Normal respiratory effort.  Port in RU chest wall  Abdomen:  soft, NT, ND, NABS. Mildly protuberant  Back:   normal alignment, no CVAT Skin:  no rash or induration seen on limited exam Musculoskeletal:  grossly normal tone BUE/BLE, good ROM, no bony abnormality Lower extremity:  No LE edema.  Limited foot exam with no ulcerations.  2+ distal pulses. Psychiatric:  grossly normal mood and affect, speech fluent and appropriate, AOx3 Neurologic:  CN 2-12 grossly intact, moves all extremities in coordinated fashion, sensation intact   Radiological Exams on Admission: Independently reviewed - see discussion in A/P where applicable  No results found.   Labs on Admission: I have personally reviewed the available labs and imaging studies at the time of the admission.  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 Pain could not be controlled in ED so he was admitted for intractable pain.  -obs to med surg -gentle IVF -check B12  -admitted in April 2025 with similar history of pain and required a dilaudid  PCA. Oncology has no other systemic treatment to offer him. He was seen at [facility] following this for a second opinion and recommended palliative support and then hospice when appropriate.  -He is being followed by palliative care and recently had his pain medication changed to liquid morphine  as they couldn't find liquid dilaudid . He continues to take methadone  10mg  TID. When they changed him to liquid morphine  his pain became uncontrolled and he came to ED -he is up pacing in room and doesn't want to be drowsy from the pain medication, but wants his pain controlled. He wanted to decrease or stop things, but discussed this wouldn't control his pain which is why he is here.  -will keep methadone  the same at 10mg  q 8 hours, tizanidine  PRN, steroids and then try dilaudid  1mg  q 2 hours PRN IV. He also has gabapentin  and ativan . Discussed all of these could potentially cause drowsiness. May require PCA, but will try for PRN dilaudid  to see if this helps not make him too drowsy. Pain is tolerable right now . -palliative aware he is here and have been consulted -discussed with wife that hospice is likely next step. 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 Observation status is judged to be reasonable and necessary in order to provide the required intensity of service to ensure the patient's safety. The patient's presenting symptoms, physical exam findings,  and initial radiographic and laboratory data in the context of their medical condition is felt to place them at decreased risk for further clinical deterioration. Furthermore, it is anticipated that the patient will be medically stable for discharge from the hospital within 2 midnights of admission.   Author: Raymona Caldwell, MD 02/25/2024 7:22 PM  For on call review [email or web address].

## 2024-02-25 NOTE — Progress Notes (Addendum)
 At this time, pt is asking questions  to the primary nurse "What is DNR?" And "How do I change to that?". DNR meaning was explained and he verbalized understanding that a DNR will allow a natural death. Pt request to procede with code status change. On call provider notified of pt request to change code status to DNR.

## 2024-02-25 NOTE — ED Provider Notes (Signed)
 [place] [facility] GENERAL SURGERY Provider Note   CSN: [id number] Arrival date & time: 02/25/24  1439     History Chief Complaint  Patient presents with   Pain    Christian Simmons is a 38 y.o. male hx of recurrent head/neck cancer for 20 years, who presents to the ED 2/2 to worsening neck pain for the past 2-3 days.  The patient is currently no longer receiving chemo or radiation.  Recently had his medication switch from Dilaudid  to morphine .  Patient reports that he felt good pain control with the Dilaudid  however with the morphine  is not having any control of his pain.  He is already talked to his palliative care provider who encouraged him to come to the ER for changing of the medications.  He reports that he has had worsening neck pain and feels weakness but attributes this to his pain.  Denies any fevers or cough. HPI     Home Medications Prior to Admission medications   Medication Sig Start Date End Date Taking? Authorizing Provider  alum & mag hydroxide-simeth-nystatin -diphenhydrAMINE  Swish and swallow 5ml 4 (four) times daily as directed. 02/14/24  Yes Pickenpack-Cousar, Giles Labrum, NP  celecoxib  (CELEBREX ) 200 MG capsule Take 1 capsule (200 mg total) by mouth 2 (two) times daily. 02/15/24  Yes Pickenpack-Cousar, Giles Labrum, NP  dexamethasone  (DECADRON ) 2 MG tablet Take 2 tablets (4 mg total) by mouth 3 (three) times daily. 02/21/24  Yes Pickenpack-Cousar, Giles Labrum, NP  dimenhyDRINATE  (DRAMAMINE) 50 MG tablet Take 50 mg by mouth every 8 (eight) hours as needed.   Yes [provider]  gabapentin  (NEURONTIN ) 100 MG capsule Take 2 capsules (200 mg total) by mouth 2 (two) times daily. 10/22/23  Yes Almeda Jacobs, MD  levothyroxine  (SYNTHROID ) 175 MCG tablet Take 1 tablet (175 mcg total) by mouth daily before breakfast. 02/21/24  Yes Gorsuch, Ni, MD  LORazepam  (ATIVAN ) 0.5 MG tablet Take 1 tablet (0.5 mg total) by mouth every 6 (six) hours as needed for anxiety.  02/21/24  Yes Pickenpack-Cousar, Athena N, NP  methadone  (DOLOPHINE ) 10 MG tablet Take 1 tablet (10 mg) by mouth every 8 hours. 02/21/24  Yes Pickenpack-Cousar, Athena N, NP  Morphine  Sulfate (MORPHINE  CONCENTRATE) 10 mg / 0.5 ml concentrated solution Take 0.25-0.5 mLs (5-10 mg total) by mouth every 4 (four) hours as needed for severe pain (pain score 7-10), shortness of breath, anxiety or moderate pain (pain score 4-6). 02/23/24  Yes Pickenpack-Cousar, Giles Labrum, NP  Oxycodone  HCl 10 MG TABS Take 1 tablet by mouth every 4 (four) hours as needed.   Yes [provider]  Oxycodone  HCl 20 MG TABS Take 1 tablet by mouth every 4 (four) hours as needed.   Yes [provider]  pantoprazole  (PROTONIX ) 40 MG tablet Take 1 tablet (40 mg total) by mouth daily. 10/26/23  Yes Gorsuch, Ni, MD  polyethylene glycol (MIRALAX  / GLYCOLAX ) 17 g packet Take 17 g by mouth 2 (two) times daily. 09/10/23  Yes Gorsuch, Lovella Rubin, MD  prochlorperazine  (COMPAZINE ) 10 MG tablet Take 1 tablet (10 mg total) by mouth every 6 (six) hours as needed for nausea or vomiting. 05/07/23  Yes Gorsuch, Ni, MD  tiZANidine  (ZANAFLEX ) 2 MG tablet Take 1 tablet (2 mg total) by mouth every 6 (six) hours as needed for muscle spasms. 02/09/24  Yes Pickenpack-Cousar, Giles Labrum, NP  blood glucose meter kit and supplies KIT Dispense based on patient and insurance preference. Use up to four times daily as directed. (  FOR ICD-9 250.00, 250.01). 11/11/20   Almeda Jacobs, MD      Allergies    Phenergan  [promethazine  hcl], Heparin , Pork-derived products, and Clindamycin    Review of Systems   Review of Systems  Constitutional:  Negative for chills and fever.  Musculoskeletal:  Positive for neck pain.  Neurological:  Positive for weakness.    Physical Exam Updated Vital Signs BP (!) 176/100 (BP Location: Right Arm)   Pulse 95   Temp 98.8 F (37.1 C) (Axillary)   Resp 17   Ht 5\' 6"  (1.676 m)   Wt 82 kg   SpO2 97%   BMI 29.18 kg/m  Physical  Exam Vitals and nursing note reviewed.  Constitutional:      Comments: Chronically ill-appearing, cachectic.  Patient did not want to be evaluated in stretcher so physical examination is limited.  Eyes:     General: No scleral icterus. Cardiovascular:     Rate and Rhythm: Normal rate.     Pulses:          Radial pulses are 2+ on the right side and 2+ on the left side.  Pulmonary:     Effort: Pulmonary effort is normal. No respiratory distress.  Skin:    General: Skin is warm and dry.  Neurological:     Mental Status: He is alert.     Comments: Diffuse generalized weakness     ED Results / Procedures / Treatments   Labs (all labs ordered are listed, but only abnormal results are displayed) Labs Reviewed  COMPREHENSIVE METABOLIC PANEL WITH GFR - Abnormal; Notable for the following components:      Result Value   Sodium 133 (*)    Chloride 92 (*)    Glucose, Bld 224 (*)    BUN 27 (*)    Creatinine, Ser 0.54 (*)    ALT 58 (*)    All other components within normal limits  CBC WITH DIFFERENTIAL/PLATELET - Abnormal; Notable for the following components:   RBC 4.13 (*)    Hemoglobin 12.5 (*)    RDW 19.6 (*)    nRBC 0.8 (*)    Abs Immature Granulocytes 0.52 (*)    All other components within normal limits  HEMOGLOBIN A1C - Abnormal; Notable for the following components:   Hgb A1c MFr Bld 6.4 (*)    All other components within normal limits  GLUCOSE, CAPILLARY - Abnormal; Notable for the following components:   Glucose-Capillary 212 (*)    All other components within normal limits  BASIC METABOLIC PANEL WITH GFR  CBC  VITAMIN B12    EKG None  Radiology No results found.  Procedures Procedures   Medications Ordered in ED Medications  dexamethasone  (DECADRON ) tablet 4 mg (4 mg Oral Given 02/25/24 2007)  levothyroxine  (SYNTHROID ) tablet 175 mcg (has no administration in time range)  pantoprazole  (PROTONIX ) EC tablet 40 mg (has no administration in time range)   dimenhyDRINATE (DRAMAMINE) tablet 50 mg (has no administration in time range)  polyethylene glycol (MIRALAX  / GLYCOLAX ) packet 17 g (17 g Oral Patient Refused/Not Given 02/25/24 2031)  tiZANidine  (ZANAFLEX ) tablet 2 mg (has no administration in time range)  nystatin  (MYCOSTATIN ) 100000 UNIT/ML suspension 500,000 Units (500,000 Units Oral Given 02/25/24 2007)  methadone  (DOLOPHINE ) tablet 10 mg (10 mg Oral Given 02/25/24 2007)  gabapentin  (NEURONTIN ) capsule 200 mg (200 mg Oral Given 02/25/24 2007)  LORazepam  (ATIVAN ) tablet 0.5 mg (0.5 mg Oral Given 02/25/24 2244)  0.9 %  sodium chloride  infusion (has  no administration in time range)  acetaminophen  (TYLENOL ) tablet 650 mg (has no administration in time range)    Or  acetaminophen  (TYLENOL ) suppository 650 mg (has no administration in time range)  ondansetron  (ZOFRAN ) tablet 4 mg (has no administration in time range)    Or  ondansetron  (ZOFRAN ) injection 4 mg (has no administration in time range)  insulin aspart (novoLOG) injection 0-9 Units (3 Units Subcutaneous Patient Refused/Not Given 02/25/24 2101)  sodium chloride  flush (NS) 0.9 % injection 10-40 mL (has no administration in time range)  sodium chloride  flush (NS) 0.9 % injection 10-40 mL (has no administration in time range)  Chlorhexidine  Gluconate Cloth 2 % PADS 6 each (has no administration in time range)  HYDROmorphone  (DILAUDID ) 1 mg/mL PCA injection (30 mg Intravenous Set-up / Initial Syringe 02/25/24 2244)  HYDROmorphone  (DILAUDID ) injection 1 mg (1 mg Intravenous Given 02/25/24 1602)  sodium chloride  0.9 % bolus 1,000 mL (0 mLs Intravenous Stopped 02/25/24 1731)  ondansetron  (ZOFRAN ) injection 4 mg (4 mg Intravenous Given 02/25/24 1603)  HYDROmorphone  (DILAUDID ) injection 1 mg (1 mg Intravenous Given 02/25/24 1649)    ED Course/ Medical Decision Making/ A&P Clinical Course as of 02/25/24 2314  Fri Feb 25, 2024  1512 Consult placed to palliative care. Awaiting call back.  [RR]     Clinical Course User Index [RR] Spence Dux, PA-C   Medical Decision Making Amount and/or Complexity of Data Reviewed Labs: ordered.  Risk Prescription drug management. Decision regarding hospitalization.   38 y.o. male presents to the ER for evaluation of neck pain. Differential diagnosis includes but is not limited to cancer, acute pathology. Vital signs unremarkable. Physical exam as noted above.   Patient has a significant history with stage IV metastatic cancer and bony pain.  He complains of generalized weakness but associates this because of his uncontrollable pain at home with recent switch to morphine .  He reports that he spoke to his palliative care provider who encouraged him to come to the ER for pain control likely admission to switch over his medications.  I independently reviewed and interpreted the patient's labs.  CBC shows mild anemia with hemoglobin 10.5.  No other cytosis.  CMP shows mild decrease sodium 133, chloride 92, glucose 224, BUN of 27, creatinine 0.54, ALT 58 otherwise no other electrolyte or LFT abnormality.  I have consulted palliative care and spoke with Harvest Lineman, NP. She would like the patient admitted for pain control. No other recommendations. I have ordered basic labs for admission. He has generalized weakness, but I do not appreciate any focal weakness. Denies any fevers. Will admit to medicine. Patient agreeable to admission.   I discussed this case with my attending physician who cosigned this note including patient's presenting symptoms, physical exam, and planned diagnostics and interventions. Attending physician stated agreement with plan or made changes to plan which were implemented.   Attending physician assessed patient at bedside.  Portions of this report may have been transcribed using voice recognition software. Every effort was made to ensure accuracy; however, inadvertent computerized transcription errors may be present.     Final Clinical Impression(s) / ED Diagnoses Final diagnoses:  Chronic pain due to neoplasm    Rx / DC Orders ED Discharge Orders     None         Spence Dux, Kirby Peoples 02/25/24 2323    Sueellen Emery, MD 02/26/24 [phone]

## 2024-02-25 NOTE — Progress Notes (Signed)
 Pharmacy Note regarding: pain meds, not controlled with morphine . on methadone    Current prior to admission meds include: Methadone  10 mg po q8h Morphine  0.25-0.5ml (5-10 mg) q4h prn severe pain, SOB, anxiety or moderate pain  Current Inpatient pain meds include: Methadone  10 mg  po q8h Hydromorphone  1 mg IV q2h prn moderate to severe pain  Plan: Monitor response to hydromorphone  for pain relief Defer to Palliative Care who has been consulted for anymore changes to pain medications   Thank you for allowing pharmacy to be a part of this patient's care.  Alfredo Inch, PharmD, BCPS Clinical Pharmacist [facility] 02/25/2024 7:58 PM

## 2024-02-25 NOTE — Assessment & Plan Note (Addendum)
-  on chronic steroids for cancer  -no recent A1C -hold metformin  -continue gabapentin  BID  -SSI and accuchecks QAC/HS

## 2024-02-26 ENCOUNTER — Other Ambulatory Visit (HOSPITAL_COMMUNITY): Payer: Self-pay

## 2024-02-26 DIAGNOSIS — Z66 Do not resuscitate: Secondary | ICD-10-CM | POA: Diagnosis not present

## 2024-02-26 DIAGNOSIS — Z7189 Other specified counseling: Secondary | ICD-10-CM

## 2024-02-26 DIAGNOSIS — G893 Neoplasm related pain (acute) (chronic): Secondary | ICD-10-CM

## 2024-02-26 DIAGNOSIS — C760 Malignant neoplasm of head, face and neck: Principal | ICD-10-CM

## 2024-02-26 DIAGNOSIS — R4589 Other symptoms and signs involving emotional state: Secondary | ICD-10-CM

## 2024-02-26 DIAGNOSIS — C7951 Secondary malignant neoplasm of bone: Secondary | ICD-10-CM

## 2024-02-26 DIAGNOSIS — R52 Pain, unspecified: Secondary | ICD-10-CM | POA: Diagnosis not present

## 2024-02-26 DIAGNOSIS — Z79899 Other long term (current) drug therapy: Secondary | ICD-10-CM

## 2024-02-26 DIAGNOSIS — Z515 Encounter for palliative care: Secondary | ICD-10-CM

## 2024-02-26 LAB — BASIC METABOLIC PANEL WITH GFR
Anion gap: 10 (ref 5–15)
BUN: 22 mg/dL — ABNORMAL HIGH (ref 6–20)
CO2: 30 mmol/L (ref 22–32)
Calcium: 9.1 mg/dL (ref 8.9–10.3)
Chloride: 94 mmol/L — ABNORMAL LOW (ref 98–111)
Creatinine, Ser: 0.52 mg/dL — ABNORMAL LOW (ref 0.61–1.24)
GFR, Estimated: >60 mL/min (ref >60)
Glucose, Bld: 154 mg/dL — ABNORMAL HIGH (ref 70–99)
Potassium: 3.9 mmol/L (ref 3.5–5.1)
Sodium: 134 mmol/L — ABNORMAL LOW (ref 135–145)

## 2024-02-26 LAB — CBC
HCT: 39.6 % (ref 39.0–52.0)
Hemoglobin: 12.6 g/dL — ABNORMAL LOW (ref 13.0–17.0)
MCH: 30.6 pg (ref 26.0–34.0)
MCHC: 31.8 g/dL (ref 30.0–36.0)
MCV: 96.1 fL (ref 80.0–100.0)
Platelets: 211 10*3/uL (ref 150–400)
RBC: 4.12 MIL/uL — ABNORMAL LOW (ref 4.22–5.81)
RDW: 19.1 % — ABNORMAL HIGH (ref 11.5–15.5)
WBC: 9.2 10*3/uL (ref 4.0–10.5)
nRBC: 0.7 % — ABNORMAL HIGH (ref 0.0–0.2)

## 2024-02-26 LAB — GLUCOSE, CAPILLARY
Glucose-Capillary: 200 mg/dL — ABNORMAL HIGH (ref 70–99)
Glucose-Capillary: 162 mg/dL — ABNORMAL HIGH (ref 70–99)

## 2024-02-26 MED ORDER — POLYVINYL ALCOHOL 1.4 % OP SOLN: 1.0000 [drp] | Freq: Four times a day (QID) | OPHTHALMIC | Status: DC | PRN

## 2024-02-26 MED ORDER — DEXAMETHASONE 4 MG PO TABS
6.0000 mg | ORAL_TABLET | Freq: Three times a day (TID) | ORAL | 0 refills | Status: DC
  Filled 2024-02-26: qty 32, 7d supply, fill #0

## 2024-02-26 MED ORDER — HYDROMORPHONE HCL 1 MG/ML IJ SOLN: 2.5000 mg | INTRAMUSCULAR | Status: DC

## 2024-02-26 MED ORDER — ONDANSETRON 4 MG PO TBDP
4.0000 mg | ORAL_TABLET | Freq: Four times a day (QID) | ORAL | Status: DC | PRN
Start: 2024-02-26 — End: 2024-02-26

## 2024-02-26 MED ORDER — GLYCOPYRROLATE 1 MG PO TABS: 1.0000 mg | ORAL_TABLET | ORAL | Status: DC | PRN

## 2024-02-26 MED ORDER — HYDROMORPHONE BOLUS VIA INFUSION: 1.0000 mg | INTRAVENOUS | Status: DC | PRN

## 2024-02-26 MED ORDER — LORAZEPAM 1 MG PO TABS
1.0000 mg | ORAL_TABLET | ORAL | Status: DC | PRN
  Administered 2024-02-26: 1 mg via ORAL
  Filled 2024-02-26: qty 1

## 2024-02-26 MED ORDER — HYDROMORPHONE HCL 1 MG/ML IJ SOLN
1.0000 mg | Freq: Once | INTRAMUSCULAR | Status: AC
  Administered 2024-02-26: 1 mg via INTRAVENOUS
  Filled 2024-02-26: qty 1

## 2024-02-26 MED ORDER — GLYCOPYRROLATE 0.2 MG/ML IJ SOLN: 0.2000 mg | INTRAMUSCULAR | Status: DC | PRN

## 2024-02-26 MED ORDER — DEXAMETHASONE 4 MG PO TABS
6.0000 mg | ORAL_TABLET | Freq: Three times a day (TID) | ORAL | Status: DC
  Administered 2024-02-26: 6 mg via ORAL
  Filled 2024-02-26: qty 2

## 2024-02-26 MED ORDER — HYDROMORPHONE HCL 1 MG/ML PO LIQD: 6.0000 mg | ORAL | Status: DC | PRN

## 2024-02-26 MED ORDER — GLYCOPYRROLATE 0.2 MG/ML IJ SOLN
0.2000 mg | INTRAMUSCULAR | 0 refills | Status: AC | PRN
  Filled 2024-02-26: qty 30, 5d supply, fill #0

## 2024-02-26 MED ORDER — GLYCOPYRROLATE 1 MG PO TABS
1.0000 mg | ORAL_TABLET | ORAL | 0 refills | Status: AC | PRN
  Filled 2024-02-26: qty 30, 5d supply, fill #0

## 2024-02-26 MED ORDER — HALOPERIDOL 0.5 MG PO TABS: 0.5000 mg | ORAL_TABLET | ORAL | Status: DC | PRN

## 2024-02-26 MED ORDER — ONDANSETRON HCL 4 MG/2ML IJ SOLN: 4.0000 mg | Freq: Four times a day (QID) | INTRAMUSCULAR | Status: DC | PRN

## 2024-02-26 MED ORDER — ONDANSETRON 4 MG PO TBDP
4.0000 mg | ORAL_TABLET | Freq: Four times a day (QID) | ORAL | 0 refills | Status: DC | PRN
  Filled 2024-02-26: qty 20, 5d supply, fill #0

## 2024-02-26 MED ORDER — HYDROMORPHONE HCL-NACL 50-0.9 MG/50ML-% IV SOLN: 1.0000 mg/h | INTRAVENOUS | Status: DC

## 2024-02-26 MED ORDER — HALOPERIDOL LACTATE 5 MG/ML IJ SOLN: 0.5000 mg | INTRAMUSCULAR | Status: DC | PRN

## 2024-02-26 MED ORDER — LORAZEPAM 1 MG PO TABS
1.0000 mg | ORAL_TABLET | ORAL | 0 refills | Status: AC | PRN
  Filled 2024-02-26: qty 30, 5d supply, fill #0

## 2024-02-26 MED ORDER — ORAL CARE MOUTH RINSE: 15.0000 mL | OROMUCOSAL | Status: DC | PRN

## 2024-02-26 MED ORDER — BIOTENE DRY MOUTH MT LIQD: 15.0000 mL | OROMUCOSAL | Status: DC | PRN

## 2024-02-26 MED ORDER — HYDROMORPHONE HCL 1 MG/ML PO LIQD
4.0000 mg | ORAL | Status: DC | PRN
Start: 2024-02-26 — End: 2024-02-26

## 2024-02-26 MED ORDER — DEXAMETHASONE 4 MG PO TABS: 6.0000 mg | ORAL_TABLET | Freq: Three times a day (TID) | ORAL | 0 refills | Status: DC

## 2024-02-26 MED ORDER — HYDROMORPHONE 1 MG/ML IV SOLN
INTRAVENOUS | Status: DC
  Administered 2024-02-26: 30 mg via INTRAVENOUS

## 2024-02-26 MED ORDER — HYDROMORPHONE 1 MG/ML IV SOLN: INTRAVENOUS | Status: DC

## 2024-02-26 MED ORDER — HALOPERIDOL LACTATE 2 MG/ML PO CONC: 0.5000 mg | ORAL | Status: DC | PRN

## 2024-02-26 NOTE — Plan of Care (Signed)

## 2024-02-26 NOTE — Plan of Care (Signed)
 Pt and pt wife received, educated on, and understands discharge summary.  Pt has all belongings.  Pt to DC to hospice with PORT accessed.

## 2024-02-26 NOTE — Consult Note (Signed)
 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  Prior imaging including MRI in April had shown concerns for metastatic disease with direct invasion of the foraminal/epidural process.  Patient now presenting with worsening muscle control and inability to move extremities.  Patient is not a candidate for any further systemic cancer therapies and has reached maximum radiation.  Patient would not be an appropriate candidate for surgery.  Patient had even gone to [facility] for second opinion and was recommended palliative support and hospice at that time.  Palliative medicine team consulted to assist with complex medical decision making. Patient is known to palliative medicine team from prior hospitalizations.  Reviewed EMR prior to presenting to bedside.  Patient has been receiving IV Dilaudid  PCA.  At time of EMR review in past 24 hours between PCA bolus dosing and pushes of IV Dilaudid  as needed for breakthrough pain, patient has received 11 mg of Dilaudid .  Patient normally on methadone  8 mg every 8 hours during the day.  Patient unable to take today's dose due to difficulties with swallowing.  Suspect abnormalities in swallowing and muscle movement are secondary to continued invasion of epidural space from cancer.  Discussed care with hospitalist to coordinate.  ------------------------------------------------------------------------------------------------------------- Advance Care Planning Conversation  Pertinent diagnosis:  Stage IV head and neck cancer with metastatic disease to bone and lungs (not a candidate for further cancer directed therapies), frailty, cancer associated cachexia  The patient and/or family consented to a voluntary Advance Care Planning Conversation in person. Individuals present for the conversation: Patient, patient's wife, and this palliative medicine provider  Summary of the conversation:  Presented to bedside to see patient.  Patient laying in bed, grimacing at times.  Patient tired though will awaken to interact with provider.  Patient's wife present at bedside.  Introduced myself as a member of the palliative medicine team.  Patient and wife remember this provider from prior interaction.  With permission, reviewed patient's symptom and suspected worsening of cancer progression.  Patient is not a candidate for further cancer directed therapies.  Discussed that patient is at the end of life and the best support he can get at this time is to be comfortable.  Explained patient will likely need increasing pain medications due to progression of his cancer invading epidural space.  Discussed patient may even need palliative sedation if pain continues to worsen.  At this time, patient wants to be able to go home and die there.  He does not want to die in the hospital.  Discussed patient will have difficulties with taking medications orally so will need to continue PCA for management.  Discussed will place patient on IV Dilaudid  PCA with continuous infusion of 1 mg/h and 1 mg every 15 minute bolus.  Will place patient as full comfort care patient at this time.  Will also increase patient's anxiety medication to allow for comfort.  Patient and wife agreeing with this plan.  Noted would speak to [facility] hospice liaison to determine if patient can get coordinated  to go home.  Patient and wife voiced appreciation for this. Spent time providing emotional support to patient and wife.  Patient's wife  appropriately tearful during discussion.  Outcome of the conversations and/or documents completed:  Transition to full comfort focused care at this time.  Working to get patient home with hospice today on PCA for pain management at end-of-life.  I spent 30 minutes providing separately identifiable ACP services with the patient and/or surrogate decision maker in a voluntary, in-person conversation discussing the patient's wishes and goals as detailed in the above note.  Barnett Libel, DO Palliative Medicine Provider  -------------------------------------------------------------------------------------------------------------  Able to speak with [facility] liaison after speaking with patient.  Greatly appreciate tube of [facility] liaison's assistance as patient able to go home today with IV Dilaudid  PCA and will be admitted tonight to hospitalist service.  ACC liaison was able to speak with family about this. This provider continued conversations with RN, hospitalist, TOC, and [facility] liaison throughout the day to coordinate care.  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   Fatigue 06/01/2014   Hypothyroidism    Infection of eyelash follicle of left eye 12/25/2015   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   Neuropathy    Radiation 07/18/14-07/31/14   Left upper lobe  40 gy in 10 fractions   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   Social History   Socioeconomic History   Marital status: Married    Spouse name: Not on file   Number  of children: 1   Years of education: Not on file   Highest education level: Not on file  Occupational History   Not on file  Tobacco Use   Smoking status: Never   Smokeless tobacco: Never  Vaping Use   Vaping status: Never Used  Substance and Sexual Activity   Alcohol  use: No   Drug use: No   Sexual activity: Not on file  Other Topics Concern   Not on file  Social History Narrative   ** Merged History Encounter **       Social Drivers of Health   Financial Resource Strain: High Risk (02/02/2024)   Overall Financial Resource Strain (CARDIA)    Difficulty of Paying Living Expenses: Hard  Food Insecurity: Food Insecurity Present (02/02/2024)   Hunger Vital Sign    Worried About Running Out of Food in the Last Year: Sometimes true    Ran Out of Food in the Last Year: Never true  Transportation Needs: No Transportation Needs (02/02/2024)   PRAPARE - Administrator, Civil Service (Medical): No    Lack of Transportation (Non-Medical): No  Physical Activity: Not on file  Stress: Not on file  Social Connections: Not on file   Family History  Problem Relation Age of Onset   Hypertension Mother    Diabetes Mother    Hypertension Father    Diabetes Father    Scheduled Meds:  Chlorhexidine  Gluconate Cloth  6 each Topical Daily   dexamethasone   4 mg  Oral TID   gabapentin   200 mg Oral BID   HYDROmorphone    Intravenous Q4H   insulin  aspart  0-9 Units Subcutaneous TID WC   levothyroxine   175 mcg Oral QAC breakfast   methadone   10 mg Oral Q8H   nystatin   5 mL Oral QID   pantoprazole   40 mg Oral Daily   polyethylene glycol  17 g Oral BID   sodium chloride  flush  10-40 mL Intracatheter Q12H   Continuous Infusions:  sodium chloride  40 mL/hr at 02/25/24 2244   PRN Meds:.acetaminophen  **OR** acetaminophen , dimenhyDRINATE , LORazepam , ondansetron  **OR** ondansetron  (ZOFRAN ) IV, mouth rinse, sodium chloride  flush, tiZANidine  Allergies  Allergen Reactions   Phenergan   [Promethazine  Hcl] Anxiety    Causes patients HR to go into the 140's with severe anxiety   Heparin  Other (See Comments)    No Pork derivatives due to religion   Pork-Derived Products     Religious reasons   Clindamycin Rash   CBC:    Component Value Date/Time   WBC 9.2 02/26/2024 0345   HGB 12.6 (L) 02/26/2024 0345   HGB 11.7 (L) 02/21/2024 1234   HGB 14.1 09/22/2017 0829   HCT 39.6 02/26/2024 0345   HCT 41.2 09/22/2017 0829   PLT 211 02/26/2024 0345   PLT 183 02/21/2024 1234   PLT 176 09/22/2017 0829   MCV 96.1 02/26/2024 0345   MCV 90.2 09/22/2017 0829   NEUTROABS 6.7 02/25/2024 1509   NEUTROABS 3.0 09/22/2017 0829   LYMPHSABS 1.0 02/25/2024 1509   LYMPHSABS 1.4 09/22/2017 0829   MONOABS 0.6 02/25/2024 1509   MONOABS 0.6 09/22/2017 0829   EOSABS 0.0 02/25/2024 1509   EOSABS 0.1 09/22/2017 0829   BASOSABS 0.1 02/25/2024 1509   BASOSABS 0.0 09/22/2017 0829   Comprehensive Metabolic Panel:    Component Value Date/Time   NA 134 (L) 02/26/2024 0345   NA 139 09/22/2017 0829   K 3.9 02/26/2024 0345   K 3.5 09/22/2017 0829   CL 94 (L) 02/26/2024 0345   CO2 30 02/26/2024 0345   CO2 26 09/22/2017 0829   BUN 22 (H) 02/26/2024 0345   BUN 14.1 09/22/2017 0829   CREATININE 0.52 (L) 02/26/2024 0345   CREATININE 0.60 (L) 02/21/2024 1234   CREATININE 0.9 09/22/2017 0829   GLUCOSE 154 (H) 02/26/2024 0345   GLUCOSE 133 09/22/2017 0829   CALCIUM  9.1 02/26/2024 0345   CALCIUM  9.1 09/22/2017 0829   AST 30 02/25/2024 1509   AST 19 02/21/2024 1234   AST 22 09/22/2017 0829   ALT 58 (H) 02/25/2024 1509   ALT 40 02/21/2024 1234   ALT 30 09/22/2017 0829   ALKPHOS 42 02/25/2024 1509   ALKPHOS 55 09/22/2017 0829   BILITOT 0.7 02/25/2024 1509   BILITOT 0.4 02/21/2024 1234   BILITOT 0.35 09/22/2017 0829   PROT 7.1 02/25/2024 1509   PROT 6.8 09/22/2017 0829   ALBUMIN 4.0 02/25/2024 1509   ALBUMIN 4.1 09/22/2017 0829    Physical Exam: Vital Signs: BP (!) 143/103 (BP Location:  Right Arm)   Pulse 81   Temp 98.4 F (36.9 C) (Oral)   Resp 15   Ht 5\' 6"  (1.676 m)   Wt 82 kg   SpO2 97%   BMI 29.18 kg/m  SpO2: SpO2: 97 % O2 Device: O2 Device: Room Air O2 Flow Rate:   Intake/output summary:  Intake/Output Summary (Last 24 hours) at 02/26/2024 0800 Last data filed at 02/25/2024 1731 Gross per 24 hour  Intake 999 ml  Output --  Net 999 ml   LBM:   Baseline Weight: Weight: 82 kg Most recent weight: Weight: 82 kg  General: Grimacing at times, fatigued, cachectic, frail Cardiovascular: RRR Respiratory: no increased work of breathing noted, not in respiratory distress Neuro: Awake though fatigued          Palliative Performance Scale: 30%              Additional Data Reviewed: Recent Labs    02/25/24 1509 02/26/24 0345  WBC 8.8 9.2  HGB 12.5* 12.6*  PLT 188 211  NA 133* 134*  BUN 27* 22*  CREATININE 0.54* 0.52*    Imaging: CT CHEST ABDOMEN PELVIS W CONTRAST CLINICAL DATA:  Four days of abdominal pain and distention, metastatic cancer, nasopharyngeal carcinoma * Tracking Code: BO *  EXAM: CT CHEST, ABDOMEN, AND PELVIS WITH CONTRAST  TECHNIQUE: Multidetector CT imaging of the chest, abdomen and pelvis was performed following the standard protocol during bolus administration of intravenous contrast.  RADIATION DOSE REDUCTION: This exam was performed according to the departmental dose-optimization program which includes automated exposure control, adjustment of the mA and/or kV according to patient size and/or use of iterative reconstruction technique.  CONTRAST:  OMNIPAQUE  IOHEXOL  300 MG/ML  SOLN  COMPARISON:  CT chest, 04/13/2023  FINDINGS: CT CHEST FINDINGS  Cardiovascular: Right chest port catheter. Normal heart size. No pericardial effusion.  Mediastinum/Nodes: No enlarged mediastinal, hilar, or axillary lymph nodes. Thyroid  gland, trachea, and esophagus demonstrate no significant findings.  Lungs/Pleura: Unchanged  postoperative/post treatment appearance of the left upper lobe with evidence of wedge resection and associated scarring, likely related to radiation (series 302, image 36). Unchanged small nodules in the bilateral lower lobes, measuring 0.5 cm in the peripheral left lower lobe and 0.4 cm in the peripheral right lower lobe (series 302, image 55) no pleural effusion or pneumothorax.  Musculoskeletal: No chest wall abnormality. No acute osseous findings.  CT ABDOMEN PELVIS FINDINGS  Hepatobiliary: No solid liver abnormality is seen. No gallstones, gallbladder wall thickening, or biliary dilatation.  Pancreas: Unremarkable. No pancreatic ductal dilatation or surrounding inflammatory changes.  Spleen: Normal in size without significant abnormality.  Adrenals/Urinary Tract: Adrenal glands are unremarkable. Kidneys are normal, without renal calculi, solid lesion, or hydronephrosis. Bladder is unremarkable.  Stomach/Bowel: Stomach is within normal limits. Appendix appears normal. No evidence of bowel wall thickening, distention, or inflammatory changes. Generally large burden of stool with dense stool balls in the distal colon and rectum.  Vascular/Lymphatic: No significant vascular findings are present. No enlarged abdominal or pelvic lymph nodes.  Reproductive: No mass or other abnormality.  Other: No abdominal wall hernia or abnormality. No ascites.  Musculoskeletal: No acute osseous findings.  IMPRESSION: 1. No evidence of bowel obstruction. Large burden of stool with dense stool balls in the distal colon and rectum. 2. Unchanged postoperative/post treatment appearance of the left upper lobe with evidence of wedge resection and associated scarring, likely related to radiation. 3. Unchanged small nodules in the bilateral lower lobes, measuring 0.5 cm in the peripheral left lower lobe and 0.4 cm in the peripheral right lower lobe. These have been stable over multiple prior  examinations and are most likely benign and incidental. Attention on follow-up.  Electronically Signed   By: Fredricka Jenny M.D.   On: 02/21/2024 17:12    I personally reviewed recent imaging.   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  Prior imaging including MRI in April had shown concerns for metastatic disease with direct invasion of the foraminal/epidural process.  Patient now presenting with worsening muscle control and inability to move extremities.  Patient is not a candidate for any further systemic cancer therapies and has reached maximum radiation.  Patient would not be an appropriate candidate for surgery.  Patient had even gone to [facility] for second opinion and was recommended palliative support and hospice at that time.  Palliative medicine team consulted to assist with complex medical decision making. Patient is known to palliative medicine team from prior hospitalizations.  # Complex medical decision making/goals of care  - Discussed care with patient and wife at bedside as detailed above in HPI.  Patient having worsening symptoms in setting of likely cancer progression.  Discussed with patient and family that he is reaching end-of-life.  Patient is not appropriate for further cancer directed therapies; patient had even gone to [facility] for second opinion and was recommended to pursue palliative support and then hospice.  Patient wanting to be able to spend time and die at home.  Patient agreeing with transitioning to comfort focused care at this time and working to get patient home with hospice support today.  [facility] liaison and TOC assisting with coordination of discharge planning.   - Did express  concern to patient and wife about worsening pain due to cancer and that patient may eventually need palliative sedation for management.  Hospice to assist with management.  -  Code Status: Do not attempt resuscitation (DNR) - Comfort care   # Symptom management Patient is receiving these palliative interventions for symptom management with an intent to improve quality of life.   - Pain, severe acute on chronic pain in setting of metastatic head and neck cancer   - Start IV Dilaudid  PCA 1 mg/h continuous infusion and 1 mg every 15 minute as needed bolus.  Can be further adjusted by hospice at home.   - Increase dexamethasone  tablet to 6 mg 3 times daily   - Continue gabapentin  200 mg twice daily   - Continue methadone  10 mg every 8 hours.  This will likely be discontinued in near future as patient already having difficulty swallowing due to cancer progression   - Continue tizanidine  2 mg every 6 hours as needed   - Continue on bowel regimen while getting opioids   - Anxiety, in setting of end-of-life care   - Increase lorazepam  to 1 mg every 4 hours as needed  # Psycho-social/Spiritual Support:  - Support System: Wife  # Discharge Planning:  Home with Hospice through [facility] today  Thank you for allowing the palliative care team to participate in the care [facility].  Barnett Libel, DO Palliative Care Provider PMT # [phone]  If patient remains symptomatic despite maximum doses, please call PMT at [phone] between 0700 and 1900. Outside of these hours, please call attending, as PMT does not have night coverage.

## 2024-02-26 NOTE — Progress Notes (Signed)
 [facility] Liaison Note  Received request from Dr. Barnett Libel and Bettey Browning, LCSW for hospice services at home after discharge. Met with patient and spouse, Christian Simmons, at bedside to initiate education related to hospice philosophy, services and team approach to care.  Plan is for discharge home by private car today with hospice admission to occur at 5pm.  DME needs discussed. Patient has a hospital bed in the home but it does not work. DME needs will be assessed by hospice RN at admission.  Please send completed and signed DNR with patient at discharge.  Please provide prescriptions at discharge as needed to ensure ongoing symptom management. Dilaudid  PCA to be connected to patient in the home at admission.  Please call with any hospice related questions or concerns.  Thank you for the opportunity to participate in this patients care.  Lestine Rathke, BSN, [employer] [phone]

## 2024-02-26 NOTE — Discharge Summary (Signed)
 Christian Simmons VHQ:[id number] DOB: 03-Sep-1986 DOA: 02/25/2024  PCP: Patient, No Pcp Per  Admit date: 02/25/2024  Discharge date: 02/26/2024  Admitted From: Home   disposition: Home with hospice   Recommendations for Outpatient Follow-up:   Patient will be followed by hospice MD at home   Home Health: Home with hospice Equipment/Devices: Per hospice including hospital bed, bedside commode, PCA pump Consultations: Palliative care Discharge Condition: Grave CODE STATUS: DNR Diet Recommendation: What ever he would like  Diet Order             Diet regular Room service appropriate? Yes; Fluid consistency: Thin  Diet effective now                    Chief Complaint  Patient presents with   Pain     Brief history of present illness from the day of admission and additional interim summary    38 y.o. 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 He has been followed by palliative care for pain control. Recently asked for dilaudid  liquid, but they couldn't find this anywhere so he was changed to morphine  liquid and this didn't control his pain at all so he came to ED.    Admitted here in April with similar pain. MRI cervical spine showed findings concerning for metastatic disease or direct invasion of the foraminal/epidural process. Oncology has no other treatment available from a systemic standpoint. He was then seen at [facility] for a second opinion and recommendations were for palliative support and hospice when appropriate.    Denies any fever/chills, vision changes/headaches, chest pain or palpitations,  shortness of breath or cough, abdominal pain, N/V/D, dysuria or leg swelling. He is complaining of tightness in his neck and doesn't want to be drowsy from pain medication.                                                                    Hospital Course   Patient was admitted for management of intractable pain.  He was initiated on PCA with mild relief.  Patient subsequently complained of weakness of his upper arms, and hand grip.  Patient has known invasion of his cervical spine with tumor with multiple mets up and down his spine.  Discussed with radiation oncology who note there is no further radiation that could be given to the patient.  Palliative care was called and after discussion with them, and given what looks like impending cervical cord compression, patient was agreeable to be discharged home with home hospice.  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, unsp (HCC)   Acquired hypothyroidism   Malignant neoplasm of head and neck (HCC)   Neck pain    Discharge instructions      Discharge Medications   Allergies as of 02/26/2024       Reactions   Phenergan  [promethazine  Hcl] Anxiety   Causes patients HR to go into the 140's with severe anxiety   Heparin  Other (See Comments)   No Pork derivatives due to religion   Pork-derived Products    Religious reasons   Clindamycin Rash        Medication List     STOP taking these medications    alum & mag hydroxide-simeth-nystatin -diphenhydrAMINE    blood glucose meter kit and supplies Kit   celecoxib  200 MG capsule Commonly known as: CELEBREX    dimenhyDRINATE 50 MG tablet Commonly known as: DRAMAMINE   morphine  CONCENTRATE 10 mg / 0.5 ml concentrated solution   Oxycodone  HCl 10 MG Tabs   Oxycodone  HCl 20 MG Tabs   prochlorperazine  10 MG tablet Commonly known as: COMPAZINE     tiZANidine  2 MG tablet Commonly known as: ZANAFLEX        TAKE these medications    dexamethasone  6 MG tablet Commonly known as: DECADRON  Take 1 tablet (6 mg total) by mouth 3 (three) times daily for 7 days. What changed:  medication strength how much to take   gabapentin  100 MG capsule Commonly known as: Neurontin  Take 2 capsules (200 mg total) by mouth 2 (two) times daily.   glycopyrrolate  1 MG tablet Commonly known as: ROBINUL  Take 1 tablet (1 mg total) by mouth every 4 (four) hours as needed for up to 5 days (excessive secretions).   glycopyrrolate  0.2 MG/ML injection Commonly known as: ROBINUL  Inject 1 mL (0.2 mg total) into the vein every 4 (four) hours as needed for up to 5 days (excessive secretions).   levothyroxine  175 MCG tablet Commonly known as: SYNTHROID  Take 1 tablet (175 mcg total) by mouth daily before breakfast.   LORazepam  1 MG tablet Commonly known as: ATIVAN  Take 1 tablet (1 mg total) by mouth every 4 (four) hours as needed for up to 5 days for anxiety. What changed:  medication strength how much to take when to take this   methadone  10 MG tablet Commonly known as: DOLOPHINE  Take 1 tablet (10 mg) by mouth every 8 hours.   ondansetron  4 MG disintegrating tablet Commonly known as: ZOFRAN -ODT Take 1 tablet (4 mg total) by mouth every 6 (six) hours as needed for nausea.   pantoprazole  40 MG tablet Commonly known as: Protonix  Take 1 tablet (40 mg total) by mouth daily.   PEG 3350  17 g Pack Take 17 g by mouth 2 (two) times daily.          Major procedures and Radiology Reports - PLEASE review detailed and final reports thoroughly  -      CT CHEST ABDOMEN PELVIS W CONTRAST Result Date: 02/21/2024 CLINICAL DATA:  Four days of abdominal pain and distention, metastatic cancer, nasopharyngeal carcinoma * Tracking Code: BO * EXAM: CT CHEST, ABDOMEN, AND PELVIS WITH CONTRAST TECHNIQUE: Multidetector CT imaging of the chest, abdomen and pelvis  was performed following the standard protocol during bolus administration of intravenous contrast. RADIATION DOSE REDUCTION: This exam was performed according to the departmental dose-optimization program which includes automated exposure control, adjustment of the mA and/or kV according  to patient size and/or use of iterative reconstruction technique. CONTRAST:  OMNIPAQUE  IOHEXOL  300 MG/ML  SOLN COMPARISON:  CT chest, 04/13/2023 FINDINGS: CT CHEST FINDINGS Cardiovascular: Right chest port catheter. Normal heart size. No pericardial effusion. Mediastinum/Nodes: No enlarged mediastinal, hilar, or axillary lymph nodes. Thyroid  gland, trachea, and esophagus demonstrate no significant findings. Lungs/Pleura: Unchanged postoperative/post treatment appearance of the left upper lobe with evidence of wedge resection and associated scarring, likely related to radiation (series 302, image 36). Unchanged small nodules in the bilateral lower lobes, measuring 0.5 cm in the peripheral left lower lobe and 0.4 cm in the peripheral right lower lobe (series 302, image 55) no pleural effusion or pneumothorax. Musculoskeletal: No chest wall abnormality. No acute osseous findings. CT ABDOMEN PELVIS FINDINGS Hepatobiliary: No solid liver abnormality is seen. No gallstones, gallbladder wall thickening, or biliary dilatation. Pancreas: Unremarkable. No pancreatic ductal dilatation or surrounding inflammatory changes. Spleen: Normal in size without significant abnormality. Adrenals/Urinary Tract: Adrenal glands are unremarkable. Kidneys are normal, without renal calculi, solid lesion, or hydronephrosis. Bladder is unremarkable. Stomach/Bowel: Stomach is within normal limits. Appendix appears normal. No evidence of bowel wall thickening, distention, or inflammatory changes. Generally large burden of stool with dense stool balls in the distal colon and rectum. Vascular/Lymphatic: No significant vascular findings are present. No enlarged  abdominal or pelvic lymph nodes. Reproductive: No mass or other abnormality. Other: No abdominal wall hernia or abnormality. No ascites. Musculoskeletal: No acute osseous findings. IMPRESSION: 1. No evidence of bowel obstruction. Large burden of stool with dense stool balls in the distal colon and rectum. 2. Unchanged postoperative/post treatment appearance of the left upper lobe with evidence of wedge resection and associated scarring, likely related to radiation. 3. Unchanged small nodules in the bilateral lower lobes, measuring 0.5 cm in the peripheral left lower lobe and 0.4 cm in the peripheral right lower lobe. These have been stable over multiple prior examinations and are most likely benign and incidental. Attention on follow-up. Electronically Signed   By: Fredricka Jenny M.D.   On: 02/21/2024 17:12    Micro Results    No results found for this or any previous visit (from the past 240 hours).  Today   Subjective    Christian Simmons and wife understand the grave implications of what appears to be impending spinal cord compression.  They both are appreciative of palliative care involvement and they both agree that home with hospice is the best option for them right now.  Objective   Blood pressure 137/86, pulse 88, temperature 97.6 F (36.4 C), temperature source Oral, resp. rate 17, height 5\' 6"  (1.676 m), weight 82 kg, SpO2 100%.   Intake/Output Summary (Last 24 hours) at 02/26/2024 1237 Last data filed at 02/25/2024 1731 Gross per 24 hour  Intake 999 ml  Output --  Net 999 ml    Exam Thin man who appears to be in pain lying in bed looking very sad with attentive wife at bedside      Data Review   CBC w Diff:  Lab Results  Component Value Date   WBC 9.2 02/26/2024   HGB 12.6 (L) 02/26/2024   HGB 11.7 (L) 02/21/2024   HGB 14.1 09/22/2017   HCT 39.6 02/26/2024   HCT 41.2 09/22/2017   PLT 211 02/26/2024   PLT 183 02/21/2024   PLT 176 09/22/2017   LYMPHOPCT 11  02/25/2024   LYMPHOPCT 26.8 09/22/2017   MONOPCT 6 02/25/2024   MONOPCT 10.7 09/22/2017   EOSPCT 0 02/25/2024  EOSPCT 2.7 09/22/2017   BASOPCT 1 02/25/2024   BASOPCT 0.4 09/22/2017    CMP:  Lab Results  Component Value Date   NA 134 (L) 02/26/2024   NA 139 09/22/2017   K 3.9 02/26/2024   K 3.5 09/22/2017   CL 94 (L) 02/26/2024   CO2 30 02/26/2024   CO2 26 09/22/2017   BUN 22 (H) 02/26/2024   BUN 14.1 09/22/2017   CREATININE 0.52 (L) 02/26/2024   CREATININE 0.60 (L) 02/21/2024   CREATININE 0.9 09/22/2017   PROT 7.1 02/25/2024   PROT 6.8 09/22/2017   ALBUMIN 4.0 02/25/2024   ALBUMIN 4.1 09/22/2017   BILITOT 0.7 02/25/2024   BILITOT 0.4 02/21/2024   BILITOT 0.35 09/22/2017   ALKPHOS 42 02/25/2024   ALKPHOS 55 09/22/2017   AST 30 02/25/2024   AST 19 02/21/2024   AST 22 09/22/2017   ALT 58 (H) 02/25/2024   ALT 40 02/21/2024   ALT 30 09/22/2017  .   Total Time in preparing paper work, data evaluation and todays exam - 35 minutes  Magdalene School M.D on 02/26/2024 at 12:37 PM  [facility]

## 2024-02-26 NOTE — Hospital Course (Addendum)
 Hospital course:  38 year old with 20-year history of stage IV head neck cancer with mets to lung and bone was admitted for treatment of intractable pain unable to be managed as an outpatient on liquid morphine , would prefer liquid Dilaudid  but was unable to find any..  Patient had recent admission for the same in April 2025 requiring up Dilaudid  PCA.  Patient is followed by palliative care, he was started on a PCA   Subjective: ***    Exam:  General: *** Eyes: sclera anicteric, conjuctiva mild injection bilaterally CVS: S1-S2, regular  Respiratory:  decreased air entry bilaterally secondary to decreased inspiratory effort, rales at bases  GI: NABS, soft, NT  LE: Warm and well-perfused Neuro: A/O x 3,  grossly nonfocal.  Psych: patient is logical and coherent, judgement and insight appear normal, mood and affect appropriate to situation.  Assessment & Plan:   Stage IV head and neck CA Intractable pain On Dilaudid  PCA Would like to try liquid Dilaudid  at home Palliative care is ordered and pending for consideration of hospice Patient is DNR limited  Mild hyponatremia Possibly secondary to decreased p.o. intake On NS at 40 at present  DM2   Hypothyroidism Continue Synthroid   Dilaudid  2.5 iv x 1 Then back to usual dilaudid  4-6 q 4hr prn XRT to see Dr Azalea Lento to see         DVT prophylaxis: *** Code Status: *** Family Communication: *** Disposition:  ***

## 2024-02-26 NOTE — Progress Notes (Addendum)
 On-call provider previously contacted for 10/10 back pain unrelieved by two doses of PRN Dilaudid  per MAR. BP remains elevated and pt is awake and restless.  Orders for PCA pump received and pump initiated at this time. Pt was educated on use and associated monitoring requirements. Pt verbalizes understanding. He is able to demonstrate proper use of PCA button. Vitals remain stable.  Wife present at bedside and educated to PCA.

## 2024-02-26 NOTE — Progress Notes (Addendum)
 Pt is awake and sitting at bedside eating. Pt states his pain "is a little better" at 7-8/10. He states he was able to get a little sleep. PCA encouraged at this time. His wife remains at bedside. VS remain stable on RA. On-call provider updated to pt status.

## 2024-02-28 ENCOUNTER — Encounter: Payer: Self-pay | Admitting: Nurse Practitioner

## 2024-02-29 ENCOUNTER — Other Ambulatory Visit: Payer: Self-pay

## 2024-02-29 ENCOUNTER — Encounter: Payer: Self-pay | Admitting: Licensed Clinical Social Worker

## 2024-02-29 DIAGNOSIS — C760 Malignant neoplasm of head, face and neck: Secondary | ICD-10-CM

## 2024-02-29 DIAGNOSIS — C7800 Secondary malignant neoplasm of unspecified lung: Secondary | ICD-10-CM

## 2024-02-29 DIAGNOSIS — Z7189 Other specified counseling: Secondary | ICD-10-CM

## 2024-02-29 DIAGNOSIS — C119 Malignant neoplasm of nasopharynx, unspecified: Secondary | ICD-10-CM

## 2024-02-29 DIAGNOSIS — Z515 Encounter for palliative care: Secondary | ICD-10-CM

## 2024-02-29 NOTE — Progress Notes (Signed)
 [facility] Clinical Social Work  CSW received VM from pt's wife stating they have not heard from [facility] regarding PCS referral.  CSW called [facility] and left a VM with the LTSS team to check status of referral.  Updated Nimo that VM has been left and CSW will follow-up on Thursday.   Keyanah Kozicki E Mearl Olver, LCSW

## 2024-03-01 ENCOUNTER — Encounter: Payer: Self-pay | Admitting: Hematology and Oncology

## 2024-03-01 ENCOUNTER — Other Ambulatory Visit (HOSPITAL_BASED_OUTPATIENT_CLINIC_OR_DEPARTMENT_OTHER): Payer: Self-pay

## 2024-03-01 MED ORDER — LORAZEPAM 0.5 MG PO TABS
0.5000 mg | ORAL_TABLET | ORAL | 0 refills | Status: DC | PRN
  Filled 2024-03-01: qty 40, 7d supply, fill #0

## 2024-03-01 MED ORDER — METFORMIN HCL 500 MG PO TABS
500.0000 mg | ORAL_TABLET | Freq: Two times a day (BID) | ORAL | 0 refills | Status: DC
  Filled 2024-03-01: qty 60, 30d supply, fill #0

## 2024-03-01 MED ORDER — DEXAMETHASONE 4 MG PO TABS
6.0000 mg | ORAL_TABLET | Freq: Three times a day (TID) | ORAL | 0 refills | Status: DC
  Filled 2024-03-01: qty 32, 7d supply, fill #0

## 2024-03-02 ENCOUNTER — Other Ambulatory Visit (HOSPITAL_BASED_OUTPATIENT_CLINIC_OR_DEPARTMENT_OTHER): Payer: Self-pay

## 2024-03-02 ENCOUNTER — Encounter: Payer: Self-pay | Admitting: Hematology and Oncology

## 2024-03-02 ENCOUNTER — Encounter: Payer: Self-pay | Admitting: Licensed Clinical Social Worker

## 2024-03-03 ENCOUNTER — Other Ambulatory Visit (HOSPITAL_BASED_OUTPATIENT_CLINIC_OR_DEPARTMENT_OTHER): Payer: Self-pay

## 2024-03-05 NOTE — Progress Notes (Signed)
 [facility] CSW Progress Note  Visual merchandiser received return call from [employer] stating that they cannot process PCS if pt wants wife to be the caregiver. They advised to refer through [facility] for CAP-DA. Referral faxed at 9:01am. TC to [facility] to confirm that referral was received and is being processed at 1:05pm.  CSW spoke with pt's wife, Christian Simmons, to advise that referral is being processed for CAP-DA.  No other needs at the moment.    Christian Simmons [name], LCSW Clinical Social Worker [facility]    Patient is participating in a Managed Medicaid Plan:  Yes

## 2024-03-05 DEATH — deceased

## 2024-03-06 ENCOUNTER — Other Ambulatory Visit (HOSPITAL_BASED_OUTPATIENT_CLINIC_OR_DEPARTMENT_OTHER): Payer: Self-pay

## 2024-03-20 ENCOUNTER — Other Ambulatory Visit (HOSPITAL_BASED_OUTPATIENT_CLINIC_OR_DEPARTMENT_OTHER): Payer: Self-pay

## 2024-04-04 NOTE — Progress Notes (Signed)
 Spoke with Christian Simmons at [facility] with CAP-DA to inform of pt's passing to close referral.   Christian Ripp E Joia Doyle, LCSW

## 2024-06-07 ENCOUNTER — Other Ambulatory Visit (HOSPITAL_COMMUNITY): Payer: Self-pay
